# Patient Record
Sex: Female | Born: 1935 | Race: White | Hispanic: No | Smoking: Never smoker
Health system: Southern US, Community
[De-identification: ages and names within clinical notes are randomized; demographics above are authoritative.]

## PROBLEM LIST (undated history)

## (undated) DIAGNOSIS — E119 Type 2 diabetes mellitus without complications: Secondary | ICD-10-CM

## (undated) DIAGNOSIS — I10 Essential (primary) hypertension: Secondary | ICD-10-CM

## (undated) DIAGNOSIS — M8580 Other specified disorders of bone density and structure, unspecified site: Secondary | ICD-10-CM

## (undated) DIAGNOSIS — L0100 Impetigo, unspecified: Secondary | ICD-10-CM

## (undated) DIAGNOSIS — R1110 Vomiting, unspecified: Secondary | ICD-10-CM

## (undated) DIAGNOSIS — I4891 Unspecified atrial fibrillation: Secondary | ICD-10-CM

## (undated) DIAGNOSIS — Z972 Presence of dental prosthetic device (complete) (partial): Secondary | ICD-10-CM

## (undated) DIAGNOSIS — J45909 Unspecified asthma, uncomplicated: Secondary | ICD-10-CM

## (undated) DIAGNOSIS — C801 Malignant (primary) neoplasm, unspecified: Secondary | ICD-10-CM

## (undated) DIAGNOSIS — I509 Heart failure, unspecified: Secondary | ICD-10-CM

## (undated) DIAGNOSIS — R011 Cardiac murmur, unspecified: Secondary | ICD-10-CM

## (undated) DIAGNOSIS — M1990 Unspecified osteoarthritis, unspecified site: Secondary | ICD-10-CM

## (undated) HISTORY — PX: BLADDER SURGERY: SHX569

## (undated) HISTORY — PX: CARPAL TUNNEL RELEASE: SHX101

## (undated) HISTORY — PX: THYROID SURGERY: SHX805

## (undated) HISTORY — PX: JOINT REPLACEMENT: SHX530

## (undated) HISTORY — PX: ABDOMINAL HYSTERECTOMY: SHX81

## (undated) HISTORY — PX: BACK SURGERY: SHX140

## (undated) HISTORY — PX: EYE SURGERY: SHX253

## (undated) HISTORY — PX: REPLACEMENT TOTAL KNEE BILATERAL: SUR1225

## (undated) HISTORY — PX: CERVICAL DISC SURGERY: SHX588

## (undated) HISTORY — PX: CHOLECYSTECTOMY: SHX55

---

## 1989-03-19 DIAGNOSIS — E119 Type 2 diabetes mellitus without complications: Secondary | ICD-10-CM | POA: Insufficient documentation

## 1989-03-19 DIAGNOSIS — I10 Essential (primary) hypertension: Secondary | ICD-10-CM | POA: Insufficient documentation

## 2010-07-23 HISTORY — PX: TOTAL SHOULDER REPLACEMENT: SUR1217

## 2012-07-23 HISTORY — PX: HERNIA REPAIR: SHX51

## 2013-02-18 LAB — CBC WITH DIFFERENTIAL/PLATELET
Basophil #: 0.1 10*3/uL (ref 0.0–0.1)
HCT: 37 % (ref 35.0–47.0)
Lymphocyte %: 7 %
MCHC: 34.5 g/dL (ref 32.0–36.0)
Monocyte #: 0.2 x10 3/mm (ref 0.2–0.9)
Neutrophil %: 89.7 %
Platelet: 230 10*3/uL (ref 150–440)
RBC: 4.13 10*6/uL (ref 3.80–5.20)

## 2013-02-18 LAB — COMPREHENSIVE METABOLIC PANEL
Bilirubin,Total: 0.4 mg/dL (ref 0.2–1.0)
Calcium, Total: 8.9 mg/dL (ref 8.5–10.1)
Co2: 22 mmol/L (ref 21–32)
Creatinine: 1.24 mg/dL (ref 0.60–1.30)
EGFR (African American): 48 — ABNORMAL LOW
EGFR (Non-African Amer.): 42 — ABNORMAL LOW
Osmolality: 283 (ref 275–301)
Sodium: 133 mmol/L — ABNORMAL LOW (ref 136–145)
Total Protein: 7.7 g/dL (ref 6.4–8.2)

## 2013-02-19 ENCOUNTER — Inpatient Hospital Stay: Payer: Self-pay | Admitting: Surgery

## 2013-02-19 LAB — URINALYSIS, COMPLETE
Nitrite: NEGATIVE
Protein: NEGATIVE
RBC,UR: <1 /HPF (ref 0–5)
WBC UR: 8 /HPF (ref 0–5)

## 2013-02-20 LAB — BASIC METABOLIC PANEL
Calcium, Total: 8.3 mg/dL — ABNORMAL LOW (ref 8.5–10.1)
Chloride: 102 mmol/L (ref 98–107)
Creatinine: 1.23 mg/dL (ref 0.60–1.30)
EGFR (African American): 49 — ABNORMAL LOW
EGFR (Non-African Amer.): 42 — ABNORMAL LOW
Glucose: 179 mg/dL — ABNORMAL HIGH (ref 65–99)
Osmolality: 279 (ref 275–301)

## 2013-02-20 LAB — CBC WITH DIFFERENTIAL/PLATELET
Basophil %: 0.4 %
Eosinophil #: 0.1 10*3/uL (ref 0.0–0.7)
Eosinophil %: 1 %
HGB: 11.8 g/dL — ABNORMAL LOW (ref 12.0–16.0)
Lymphocyte #: 1.5 10*3/uL (ref 1.0–3.6)
Lymphocyte %: 11.9 %
MCHC: 35.5 g/dL (ref 32.0–36.0)
Monocyte #: 1.2 x10 3/mm — ABNORMAL HIGH (ref 0.2–0.9)
Monocyte %: 9.6 %
Neutrophil #: 9.6 10*3/uL — ABNORMAL HIGH (ref 1.4–6.5)
WBC: 12.4 10*3/uL — ABNORMAL HIGH (ref 3.6–11.0)

## 2013-02-20 LAB — CLOSTRIDIUM DIFFICILE BY PCR

## 2013-02-23 LAB — CULTURE, BLOOD (SINGLE)

## 2014-01-16 ENCOUNTER — Inpatient Hospital Stay: Payer: Self-pay | Admitting: Internal Medicine

## 2014-01-16 LAB — COMPREHENSIVE METABOLIC PANEL
AST: 17 U/L (ref 15–37)
Albumin: 3.6 g/dL (ref 3.4–5.0)
Alkaline Phosphatase: 136 U/L — ABNORMAL HIGH
Anion Gap: 9 (ref 7–16)
BILIRUBIN TOTAL: 0.6 mg/dL (ref 0.2–1.0)
BUN: 21 mg/dL — AB (ref 7–18)
CALCIUM: 9.2 mg/dL (ref 8.5–10.1)
Chloride: 102 mmol/L (ref 98–107)
Co2: 26 mmol/L (ref 21–32)
Creatinine: 1.01 mg/dL (ref 0.60–1.30)
EGFR (African American): >60
GFR CALC NON AF AMER: 53 — AB
GLUCOSE: 165 mg/dL — AB (ref 65–99)
OSMOLALITY: 280 (ref 275–301)
POTASSIUM: 4 mmol/L (ref 3.5–5.1)
SGPT (ALT): 52 U/L (ref 12–78)
SODIUM: 137 mmol/L (ref 136–145)
TOTAL PROTEIN: 7.3 g/dL (ref 6.4–8.2)

## 2014-01-16 LAB — CBC WITH DIFFERENTIAL/PLATELET
Basophil #: 0.1 10*3/uL (ref 0.0–0.1)
Basophil %: 1.1 %
Eosinophil #: 0.9 10*3/uL — ABNORMAL HIGH (ref 0.0–0.7)
Eosinophil %: 10.3 %
HCT: 42.5 % (ref 35.0–47.0)
HGB: 14.5 g/dL (ref 12.0–16.0)
LYMPHS PCT: 33.4 %
Lymphocyte #: 2.8 10*3/uL (ref 1.0–3.6)
MCH: 30.9 pg (ref 26.0–34.0)
MCHC: 34.1 g/dL (ref 32.0–36.0)
MCV: 91 fL (ref 80–100)
Monocyte #: 0.5 x10 3/mm (ref 0.2–0.9)
Monocyte %: 5.7 %
Neutrophil #: 4.2 10*3/uL (ref 1.4–6.5)
Neutrophil %: 49.5 %
PLATELETS: 279 10*3/uL (ref 150–440)
RBC: 4.69 10*6/uL (ref 3.80–5.20)
RDW: 13.7 % (ref 11.5–14.5)
WBC: 8.4 10*3/uL (ref 3.6–11.0)

## 2014-01-17 LAB — BASIC METABOLIC PANEL
Anion Gap: 11 (ref 7–16)
BUN: 28 mg/dL — ABNORMAL HIGH (ref 7–18)
CHLORIDE: 100 mmol/L (ref 98–107)
CREATININE: 1.3 mg/dL (ref 0.60–1.30)
Calcium, Total: 9.4 mg/dL (ref 8.5–10.1)
Co2: 23 mmol/L (ref 21–32)
EGFR (African American): 45 — ABNORMAL LOW
GFR CALC NON AF AMER: 39 — AB
Glucose: 240 mg/dL — ABNORMAL HIGH (ref 65–99)
OSMOLALITY: 282 (ref 275–301)
Potassium: 3.9 mmol/L (ref 3.5–5.1)
Sodium: 134 mmol/L — ABNORMAL LOW (ref 136–145)

## 2014-01-17 LAB — CBC WITH DIFFERENTIAL/PLATELET
BASOS ABS: 0 10*3/uL (ref 0.0–0.1)
BASOS PCT: 0.1 %
Eosinophil #: 0 10*3/uL (ref 0.0–0.7)
Eosinophil %: 0.1 %
HCT: 37.9 % (ref 35.0–47.0)
HGB: 13.1 g/dL (ref 12.0–16.0)
LYMPHS ABS: 0.8 10*3/uL — AB (ref 1.0–3.6)
Lymphocyte %: 9.1 %
MCH: 31 pg (ref 26.0–34.0)
MCHC: 34.5 g/dL (ref 32.0–36.0)
MCV: 90 fL (ref 80–100)
Monocyte #: 0.1 x10 3/mm — ABNORMAL LOW (ref 0.2–0.9)
Monocyte %: 0.8 %
Neutrophil #: 7.5 10*3/uL — ABNORMAL HIGH (ref 1.4–6.5)
Neutrophil %: 89.9 %
PLATELETS: 234 10*3/uL (ref 150–440)
RBC: 4.22 10*6/uL (ref 3.80–5.20)
RDW: 13.7 % (ref 11.5–14.5)
WBC: 8.3 10*3/uL (ref 3.6–11.0)

## 2014-01-18 LAB — BASIC METABOLIC PANEL
ANION GAP: 9 (ref 7–16)
BUN: 40 mg/dL — AB (ref 7–18)
CHLORIDE: 104 mmol/L (ref 98–107)
CREATININE: 1.56 mg/dL — AB (ref 0.60–1.30)
Calcium, Total: 9.1 mg/dL (ref 8.5–10.1)
Co2: 22 mmol/L (ref 21–32)
EGFR (Non-African Amer.): 31 — ABNORMAL LOW
GFR CALC AF AMER: 36 — AB
Glucose: 186 mg/dL — ABNORMAL HIGH (ref 65–99)
OSMOLALITY: 285 (ref 275–301)
Potassium: 3.9 mmol/L (ref 3.5–5.1)
Sodium: 135 mmol/L — ABNORMAL LOW (ref 136–145)

## 2014-01-19 LAB — BASIC METABOLIC PANEL
ANION GAP: 9 (ref 7–16)
BUN: 38 mg/dL — ABNORMAL HIGH (ref 7–18)
CALCIUM: 9.2 mg/dL (ref 8.5–10.1)
CHLORIDE: 103 mmol/L (ref 98–107)
Co2: 22 mmol/L (ref 21–32)
Creatinine: 1.28 mg/dL (ref 0.60–1.30)
EGFR (Non-African Amer.): 40 — ABNORMAL LOW
GFR CALC AF AMER: 46 — AB
Glucose: 259 mg/dL — ABNORMAL HIGH (ref 65–99)
OSMOLALITY: 286 (ref 275–301)
Potassium: 3.8 mmol/L (ref 3.5–5.1)
SODIUM: 134 mmol/L — AB (ref 136–145)

## 2014-01-20 LAB — BASIC METABOLIC PANEL
ANION GAP: 12 (ref 7–16)
BUN: 30 mg/dL — ABNORMAL HIGH (ref 7–18)
CO2: 22 mmol/L (ref 21–32)
Calcium, Total: 8.4 mg/dL — ABNORMAL LOW (ref 8.5–10.1)
Chloride: 104 mmol/L (ref 98–107)
Creatinine: 1.07 mg/dL (ref 0.60–1.30)
GFR CALC AF AMER: 57 — AB
GFR CALC NON AF AMER: 49 — AB
Glucose: 196 mg/dL — ABNORMAL HIGH (ref 65–99)
Osmolality: 287 (ref 275–301)
POTASSIUM: 3.4 mmol/L — AB (ref 3.5–5.1)
SODIUM: 138 mmol/L (ref 136–145)

## 2014-01-20 LAB — PLATELET COUNT: PLATELETS: 231 10*3/uL (ref 150–440)

## 2014-01-20 LAB — HEMOGLOBIN A1C: HEMOGLOBIN A1C: 7.5 % — AB (ref 4.2–6.3)

## 2014-06-21 ENCOUNTER — Ambulatory Visit: Payer: Self-pay | Admitting: Ophthalmology

## 2014-07-13 ENCOUNTER — Emergency Department: Payer: Self-pay | Admitting: Emergency Medicine

## 2014-07-13 LAB — BASIC METABOLIC PANEL
Anion Gap: 12 (ref 7–16)
BUN: 18 mg/dL (ref 7–18)
Calcium, Total: 8.5 mg/dL (ref 8.5–10.1)
Chloride: 106 mmol/L (ref 98–107)
Co2: 23 mmol/L (ref 21–32)
Creatinine: 1.26 mg/dL (ref 0.60–1.30)
GFR CALC AF AMER: 53 — AB
GFR CALC NON AF AMER: 44 — AB
GLUCOSE: 166 mg/dL — AB (ref 65–99)
OSMOLALITY: 287 (ref 275–301)
Potassium: 3.3 mmol/L — ABNORMAL LOW (ref 3.5–5.1)
Sodium: 141 mmol/L (ref 136–145)

## 2014-07-13 LAB — CBC
HCT: 45.3 % (ref 35.0–47.0)
HGB: 15.2 g/dL (ref 12.0–16.0)
MCH: 30.1 pg (ref 26.0–34.0)
MCHC: 33.5 g/dL (ref 32.0–36.0)
MCV: 90 fL (ref 80–100)
PLATELETS: 274 10*3/uL (ref 150–440)
RBC: 5.05 10*6/uL (ref 3.80–5.20)
RDW: 12.9 % (ref 11.5–14.5)
WBC: 8.6 10*3/uL (ref 3.6–11.0)

## 2014-07-13 LAB — TROPONIN I: Troponin-I: <0.02 ng/mL

## 2014-11-12 NOTE — H&P (Signed)
PATIENT NAME:  Leslie Parker, Leslie Parker MR#:  [id number] DATE OF BIRTH:  1933-01-25  DATE OF ADMISSION:  02/19/2013  CHIEF COMPLAINT: Left lower quadrant pain and fever.   HISTORY OF PRESENT ILLNESS: This is a 81 year old female patient, somewhat difficult historian, who presents with multiple episodes over the last year of left lower quadrant pain. She has had some nausea. She does not vomit because she apparently has had a Nissen fundoplication in the past but has been doing some heaving all night and had chills and fevers at home, so she came to the Emergency Room. Workup has suggested acute diverticulitis. Of note, she was on antibiotics a month ago at home with oral antibiotics for the same condition which resolved.   She denies melena or hematochezia. Had a colonoscopy 5 years ago, possibly showing polyps. All of her care is in [facility], including her surgical intervention for Nissen fundoplication, reflux disease and hiatal hernia.   PAST MEDICAL HISTORY: Diabetes. Meds are not showing on her med reconciliation, but she apparently takes metformin at this time. She has hypertension and possibly some pulmonary disease and depression.   PAST SURGICAL HISTORY: Cholecystectomy, hysterectomy. Ovaries were removed, but appendix is still in place. She has had 3 neck surgeries, 1 shoulder surgery and both knees replaced.   ALLERGIES: None.   MEDICATIONS: Multiple, see reconciliation chart.   FAMILY HISTORY: Noncontributory.   SOCIAL HISTORY: The patient does not smoke or drink.   REVIEW OF SYSTEMS: A 10-system review was performed and negative with the exception of that mentioned in the HPI.    PHYSICAL EXAMINATION:  GENERAL: Obese female patient with a weight of 182 pounds.  VITAL SIGNS: Temperature as 103.2 on admission. It is now 98.7. Pulse of 96, respirations 18, blood pressure 118/60, room air sat 96%.  HEENT: No scleral icterus.  NECK: No palpable neck nodes.  CHEST: Bilateral crackles and  rhonchi.  CARDIAC: Regular rate and rhythm.  ABDOMEN: Showing some guarding in the left lower quadrant, some percussion tenderness. Scars are noted. She is most tender in the left lower quadrant. No tenderness in the right lower quadrant.  EXTREMITIES: Show minimal edema.  NEUROLOGIC: Grossly intact.  INTEGUMENT: Shows no jaundice.   RADIOLOGY: CT scan is personally reviewed. No IV contrast was utilized, so stranding cannot be commented upon; however, she has extensive diverticular disease and redundant colon in the pelvis on the left side.   LABORATORY VALUES: Demonstrate a creatinine of 1.24, a sodium of 133 and a potassium of 3.2, glucose of 284. White blood cell count of 11.1, H and H of 12.8 and 37 and a platelet count of 230.   ASSESSMENT AND PLAN: This is a patient with likely diverticulitis. She will be admitted to the hospital and given IV antibiotics and re-examined. The options of p.o. antibiotics were discussed with her. She understood and agreed with this plan.    ____________________________ Jerrol Banana. Burt Knack, MD rec:gb D: 02/19/2013 03:22:30 ET T: 02/19/2013 03:57:21 ET JOB#: [id number]  cc: Jerrol Banana. Burt Knack, MD, <Dictator> Florene Glen MD ELECTRONICALLY SIGNED 02/19/2013 19:13

## 2014-11-12 NOTE — H&P (Signed)
Subjective/Chief Complaint LLQ pain and fever   History of Present Illness mult prior episodes, recently on po abx LLQ pain, fevers, nausea, heaving nml BM   Past History DM,HTN, depression PSH knees, shoulder neck GB Hyst, Nissen   Past Medical Health Hypertension, Diabetes Mellitus   Past Med/Surgical Hx:  Hypercholesterolemia:   Depression:   HTN:   Diverticulitis:   Hernia Repair:   Hysterectomy - Total:   Shoulder Surgery - Right:   Knee Surgery - Right:   Knee Surgery - Left:   Cholecystectomy:   Bladder Surgery:   Neck Surgery:   ALLERGIES:  No Known Allergies:   HOME MEDICATIONS: Medication Instructions Status  Advair Diskus 500 mcg-50 mcg inhalation powder 1 puff(s) inhaled 2 times a day Active  hydrochlorothiazide 12.5 mg oral tablet 1 tab(s) orally once a day Active  amLODIPine 10 mg oral tablet 1 tab(s) orally once a day Active  Zoloft 25 mg oral tablet 1 tab(s) orally once a day Active  simvastatin 10 mg oral tablet 1 tab(s) orally once a day (at bedtime) Active   Family and Social History:  Family History Non-Contributory   Social History negative tobacco, negative ETOH   Place of Living Home   Review of Systems:  Fever/Chills Yes   Cough No   Abdominal Pain Yes   Diarrhea No   Constipation No   Nausea/Vomiting Yes   SOB/DOE No   Chest Pain No   Dysuria No   Tolerating Diet No  Nauseated   Medications/Allergies Reviewed Medications/Allergies reviewed   Physical Exam:  GEN no acute distress, obese   HEENT pink conjunctivae   NECK supple   RESP normal resp effort  no use of accessory muscles  wheezing  rhonchi   CARD regular rate   ABD positive tenderness  no hernia  scars, LLQ tenderness with guarding, perc tenderness   LYMPH negative neck   EXTR positive edema   SKIN normal to palpation   PSYCH alert, A+O to time, place, person, good insight   Additional Comments had colonoscopy five years ago   Lab  Results: Hepatic:  30-Jul-14 22:39   Bilirubin, Total 0.4  Alkaline Phosphatase 103  SGPT (ALT) 23  SGOT (AST) 26  Total Protein, Serum 7.7  Albumin, Serum 3.4  Routine Chem:  30-Jul-14 22:39   Glucose, Serum  284  BUN  29  Creatinine (comp) 1.24  Sodium, Serum  133  Potassium, Serum  3.2  Chloride, Serum 99  CO2, Serum 22  Calcium (Total), Serum 8.9  Osmolality (calc) 283  eGFR (African American)  48  eGFR (Non-African American)  42 (eGFR values <90m/min/1.73 m2 may be an indication of chronic kidney disease (CKD). Calculated eGFR is useful in patients with stable renal function. The eGFR calculation will not be reliable in acutely ill patients when serum creatinine is changing rapidly. It is not useful in  patients on dialysis. The eGFR calculation may not be applicable to patients at the low and high extremes of body sizes, pregnant women, and vegetarians.)  Anion Gap 12  Routine UA:  31-Jul-14 02:54   Color (UA) Straw  Clarity (UA) Clear  Glucose (UA) Negative  Bilirubin (UA) Negative  Ketones (UA) Negative  Specific Gravity (UA) 1.006  Blood (UA) Negative  pH (UA) 6.0  Protein (UA) Negative  Nitrite (UA) Negative  Leukocyte Esterase (UA) 1+ (Result(s) reported on 19 Feb 2013 at 03:11AM.)  RBC (UA) <1 /HPF  WBC (UA) 8 /HPF  Bacteria (UA)  TRACE  Epithelial Cells (UA) 1 /HPF (Result(s) reported on 19 Feb 2013 at 03:11AM.)  Routine Hem:  30-Jul-14 22:39   WBC (CBC)  11.1  RBC (CBC) 4.13  Hemoglobin (CBC) 12.8  Hematocrit (CBC) 37.0  Platelet Count (CBC) 230  MCV 90  MCH 30.9  MCHC 34.5  RDW 12.8  Neutrophil % 89.7  Lymphocyte % 7.0  Monocyte % 1.8  Eosinophil % 1.0  Basophil % 0.5  Neutrophil #  9.9  Lymphocyte #  0.8  Monocyte # 0.2  Eosinophil # 0.1  Basophil # 0.1 (Result(s) reported on 18 Feb 2013 at 11:33PM.)    Assessment/Admission Diagnosis ac diverticulitis CT personally rev'd, no IV contrast but lots of diverticuli and tenderness,  fever admit, IV abx   Electronic Signatures: Florene Glen (MD)  (Signed 31-Jul-14 03:17)  Authored: CHIEF COMPLAINT and HISTORY, PAST MEDICAL/SURGIAL HISTORY, ALLERGIES, HOME MEDICATIONS, FAMILY AND SOCIAL HISTORY, REVIEW OF SYSTEMS, PHYSICAL EXAM, LABS, ASSESSMENT AND PLAN   Last Updated: 31-Jul-14 03:17 by Florene Glen (MD)

## 2014-11-12 NOTE — Discharge Summary (Signed)
PATIENT NAME:  Leslie Parker, BRAZZLE MR#:  [id number] DATE OF BIRTH:  Dec 01, 1932  DATE OF ADMISSION:  02/19/2013 DATE OF DISCHARGE:  02/21/2013  DIAGNOSES: Acute diverticulitis,  diabetes, hypertension, chronic obstructive pulmonary disease and depression.   PROCEDURES: None.   HISTORY OF PRESENT ILLNESS AND HOSPITAL COURSE:  This is a 81 year old female patient who presented with chronic left lower quadrant abdominal pain. Work-up in the past has shown probable diverticulitis and this was confirmed on noncontrast CT scan suggesting diverticulosis and diverticulitis. She was placed in the hospital, treated with IV antibiotics and her pain continued to improve, as did her white blood cell count. She remained afebrile. She was discharged in stable condition on oral antibiotics to follow up in our office in 10 days.   ____________________________ Jerrol Banana Burt Knack, MD rec:nts D: 02/28/2013 19:27:01 ET T: 03/01/2013 00:04:56 ET JOB#: [id number]  cc: Jerrol Banana. Burt Knack, MD, <Dictator> Florene Glen MD ELECTRONICALLY SIGNED 03/01/2013 19:47

## 2014-11-13 NOTE — H&P (Signed)
PATIENT NAME:  Leslie Parker, Leslie Parker MR#:  [id number] DATE OF BIRTH:  05/28/1933  DATE OF ADMISSION:  01/16/2014  PRIMARY CARE PHYSICIAN: Loney Loh, MD, at [facility]  REFERRING EMERGENCY ROOM PHYSICIAN: Doren Custard A. Joni Fears, MD  CHIEF COMPLAINT: Coughing and shortness of breath.   HISTORY OF PRESENT ILLNESS: A 81 year old female who has a history of COPD, diabetes, hypertension, who lives alone and very independent in her day-to-day life. For the last 2 to 3 weeks, she started having episodes of shortness of breath and coughing, which is having some good days and bad days in between. She has albuterol and Advair inhalers at home, which she uses only if she needs. Otherwise, when she is in good health, she does not need and does not take any inhalers. For the last 2 weeks, she was taking it almost every day but did not get enough relief and last night was worse. She was getting severe shortness of breath even while walking 5 to 10 steps to the bathroom, so decided to come to the Emergency Room today.   She called her primary care, actually, earlier this week and she got an appointment for next Monday, but after last night's episode could not wait until then and came over here. Given multiple times nebulizer treatment in the ER, but did not feel better and so decided to get admitted to the hospitalist service.   REVIEW OF SYSTEMS:  CONSTITUTIONAL: Negative for fever, fatigue, weakness, pain, and weight loss.  EYES: No blurring, double vision, discharge or redness.  ENT: No tinnitus, ear pain, or hearing loss.  RESPIRATORY: The patient has some cough and wheezing and shortness of breath, but no sputum production.  CARDIOVASCULAR: No chest pain, orthopnea, edema, arrhythmia, or palpitations.  GI: No nausea, vomiting, diarrhea, abdominal pain.  GENITOURINARY: No dysuria, hematuria, or increased frequency.  ENDOCRINE: No heat or cold intolerance. No excessive sweating.  SKIN: No acne, rashes, or  lesions.  MUSCULOSKELETAL: No pain or swelling in the joints.  NEUROLOGICAL: No numbness, weakness, tremor, or vertigo.  PSYCHIATRIC: No anxiety, insomnia, bipolar disorder.   PAST MEDICAL HISTORY: Diabetes, hypertension, and COPD.   PAST SURGICAL HISTORY: Had cholecystectomy, hysterectomy, ovarian surgery, appendix surgery, 3 times neck surgery, 1 time shoulder surgery, both knees replaced, hernia surgery on abdominal wall.   FAMILY HISTORY: Father had emphysema, but he was a smoker and worked as a Curator his whole life. Mother had hypertension.   SOCIAL HISTORY: She lives alone. She worked in the past in [employer] and then in  in [place], both in food services. She does not smoke. No drinking and no illegal drug use.   HOME MEDICATIONS: As per the pharmacist confirmed in the hospital:  1. Metformin 500 mg oral 2 times a day.  2. Magnesium oxide 400 mg once a day.  3. Lisinopril 10 mg oral tablet once a day.  4. Amlodipine 10 mg oral once a day.   PHYSICAL EXAMINATION:  VITAL SIGNS: In ER, temperature 98.6, pulse is 99, respirations 24, blood pressure is 154/99 and pulse oximetry is 95% on room air.  GENERAL: The patient is fully alert and oriented to time, place, and person. Does not appear in any acute distress.  HEENT: Head and neck atraumatic. Conjunctivae pink. Oral mucosa moist.  NECK: Supple. No JVD.  RESPIRATORY: Bilateral equal air entry. Some wheezing present.  CARDIOVASCULAR: S2 present, regular. No murmur.  ABDOMEN: Soft, nontender. Bowel sounds present. No organomegaly.  SKIN: No rashes.  LEGS: No edema.  NEUROLOGICAL: Power 5/5, follows commands, moves all 4 limbs.  SKIN: No acne, rashes, or lesions.  NEUROLOGICAL: No rigidity or tremors.  PSYCHIATRIC: Does not appear in any acute psychiatric illness at this time.   IMPORTANT LABORATORY RESULTS: Glucose 165, BUN 21, creatinine 1.01, sodium is 137, potassium 4, chloride is 102, CO2 is 26, and calcium is 9.2.  Total protein 7.3, bilirubin is 0.6, alkaline phosphate 136, SGOT is 17 and SGPT 52. WBC is 8.4, hemoglobin is 14.5, and platelet is 279,000. MCV is 91.   Chest x-ray was done in the ER, which is no acute inflammatory pulmonary edema. Central mild bronchitic changes present.   ASSESSMENT AND PLAN: A 81 year old female with a past medical history of chronic obstructive pulmonary disease, hypertension, diabetes, came to the Emergency Room with severe worsening shortness of breath and cough and given multiple nebulizer treatments, but still continued to have respiratory distress and admitted for chronic obstructive pulmonary disease exacerbation.  1.  Chronic obstructive pulmonary disease exacerbation. We will continue on IV steroid nebulizer treatment. We will give Rocephin and azithromycin and will give Spiriva, oxygen as needed.  2.  Hypertension. Blood pressure is currently under control. We will continue with home medication, amlodipine and lisinopril.  3.  Diabetes. We will continue her metformin, as she is taking at home and we will put insulin sliding scale coverage as she will be on IV steroids.  4.  Deep vein thrombosis prophylaxis with heparin.  5.  Gastrointestinal prophylaxis with pantoprazole.   CODE STATUS: Full code.   TOTAL TIME SPENT ON THIS ADMISSION: 50 minutes.    ____________________________ Ceasar Lund Anselm Jungling, MD vgv:ah D: 01/16/2014 17:14:03 ET T: 01/16/2014 19:35:03 ET JOB#: [id number]  cc: Ceasar Lund. Anselm Jungling, MD, <Dictator> Vaughan Basta MD ELECTRONICALLY SIGNED 01/18/2014 11:10

## 2014-11-13 NOTE — Discharge Summary (Signed)
PATIENT NAME:  Leslie Parker, Leslie Parker MR#:  [id number] DATE OF BIRTH:  1933-01-27  DATE OF ADMISSION:  01/16/2014 DATE OF DISCHARGE:  01/21/2014  FINAL DIAGNOSES:  1. Asthmatic bronchitis.  2. Hypertension.  3. Diabetes.  4. Acute kidney injury resolved with IV fluids.   MEDICATIONS ON DISCHARGE: Include amlodipine 10 mg daily, metformin 500 mg twice a day, magnesium oxide 400 mg daily, lisinopril 10 mg daily, albuterol CFC 2 puffs 4 times a day as needed for shortness of breath, Advair Diskus 250/5 one puff twice a day.  Prednisone 5 mg 4 tablets day one, 3 tablets day two, 2 tablets days three, 1 tablet days four and five, then stop. Acetaminophen oxycodone 325/5 one tablet every 6 hours as needed for pain, nystatin 100,000 units/mL 5 mL every 6 hours for 7 days, cefuroxime 500 mg 1 tablet every 12 hours for 5 days.   DIET:  Low sodium, carbohydrate -controlled diet, regular consistency.   ACTIVITY: As tolerated. Follow up in 1-2 weeks with your medical doctor.   HOSPITAL COURSE: The patient was admitted 01/16/2014, discharged 01/21/2014. Came in with coughing and shortness of breath, admitted with COPD exacerbation and was given Rocephin and Zithromax.   LABORATORY AND RADIOLOGICAL DATA:  EKG showed sinus rhythm, fusion complexes, left axis deviation, low voltage QRS, glucose 165, BUN 21, creatinine 1.01, sodium 137, potassium 4.0, chloride 102, CO2 of 26, calcium 9.2. Liver function tests: Alkaline phosphatase slightly elevated at 136. White blood cell count 8.4, H and H 14.5 and 42.5, platelet count of 279,000.   Chest x-ray: No acute infiltrate or pulmonary edema. Chest x-ray showed COPD, chronic bronchitis. Creatinine went up to 1.56, upon discharge 1.07.   HOSPITAL COURSE PER PROBLEM LIST:  1. Final diagnosis, asthmatic bronchitis. The patient took a long time to break. Was on high-dose IV Solu-Medrol, decreased to medium dose, then on high dose, and then down to medium dose.  Finally, broke upon  discharge. Lungs: Slight expiratory wheeze on the bases. Upper airways clear. Finished Zithromax while here. Cefuroxime will be completed for 5 more days. The patient is on a prednisone taper.  2. Hypertension. Blood pressure slightly elevated on lisinopril and amlodipine.  3. Diabetes. On metformin. Sugars were high while on the steroids. Hemoglobin A1c only 7.5. Does not need other medications upon discharge. Sugars will be high for a couple days.  4. Acute kidney injury resolved with IV fluid hydration.  TIME SPENT ON DISCHARGE: 35 minutes.    ____________________________ Tana Conch. Leslye Peer, MD rjw:dd D: 01/21/2014 15:30:49 ET T: 01/21/2014 21:24:52 ET JOB#: [id number]  cc: Tana Conch. Leslye Peer, MD, <Dictator> Marisue Brooklyn MD ELECTRONICALLY SIGNED 01/24/2014 14:36

## 2015-02-03 ENCOUNTER — Encounter: Payer: Self-pay | Admitting: Emergency Medicine

## 2015-02-03 ENCOUNTER — Emergency Department: Payer: Medicare Other

## 2015-02-03 ENCOUNTER — Inpatient Hospital Stay
Admission: EM | Admit: 2015-02-03 | Discharge: 2015-02-10 | DRG: 480 | Disposition: A | Discharge status: Other Acute Inpatient Hospital | Payer: Medicare Other | Attending: Internal Medicine | Admitting: Internal Medicine

## 2015-02-03 DIAGNOSIS — J45901 Unspecified asthma with (acute) exacerbation: Secondary | ICD-10-CM | POA: Diagnosis not present

## 2015-02-03 DIAGNOSIS — Z79899 Other long term (current) drug therapy: Secondary | ICD-10-CM

## 2015-02-03 DIAGNOSIS — R0602 Shortness of breath: Secondary | ICD-10-CM | POA: Insufficient documentation

## 2015-02-03 DIAGNOSIS — E1165 Type 2 diabetes mellitus with hyperglycemia: Secondary | ICD-10-CM | POA: Diagnosis not present

## 2015-02-03 DIAGNOSIS — Z8249 Family history of ischemic heart disease and other diseases of the circulatory system: Secondary | ICD-10-CM

## 2015-02-03 DIAGNOSIS — S72142A Displaced intertrochanteric fracture of left femur, initial encounter for closed fracture: Principal | ICD-10-CM | POA: Diagnosis present

## 2015-02-03 DIAGNOSIS — X58XXXA Exposure to other specified factors, initial encounter: Secondary | ICD-10-CM | POA: Diagnosis not present

## 2015-02-03 DIAGNOSIS — Z96653 Presence of artificial knee joint, bilateral: Secondary | ICD-10-CM | POA: Diagnosis present

## 2015-02-03 DIAGNOSIS — M85852 Other specified disorders of bone density and structure, left thigh: Secondary | ICD-10-CM | POA: Diagnosis present

## 2015-02-03 DIAGNOSIS — R7981 Abnormal blood-gas level: Secondary | ICD-10-CM

## 2015-02-03 DIAGNOSIS — Z888 Allergy status to other drugs, medicaments and biological substances status: Secondary | ICD-10-CM | POA: Diagnosis not present

## 2015-02-03 DIAGNOSIS — Z7952 Long term (current) use of systemic steroids: Secondary | ICD-10-CM

## 2015-02-03 DIAGNOSIS — R0902 Hypoxemia: Secondary | ICD-10-CM | POA: Insufficient documentation

## 2015-02-03 DIAGNOSIS — M1990 Unspecified osteoarthritis, unspecified site: Secondary | ICD-10-CM | POA: Diagnosis present

## 2015-02-03 DIAGNOSIS — S72002A Fracture of unspecified part of neck of left femur, initial encounter for closed fracture: Secondary | ICD-10-CM | POA: Diagnosis present

## 2015-02-03 DIAGNOSIS — J441 Chronic obstructive pulmonary disease with (acute) exacerbation: Secondary | ICD-10-CM | POA: Diagnosis not present

## 2015-02-03 DIAGNOSIS — Z419 Encounter for procedure for purposes other than remedying health state, unspecified: Secondary | ICD-10-CM

## 2015-02-03 DIAGNOSIS — J9601 Acute respiratory failure with hypoxia: Secondary | ICD-10-CM | POA: Diagnosis not present

## 2015-02-03 DIAGNOSIS — S7292XA Unspecified fracture of left femur, initial encounter for closed fracture: Secondary | ICD-10-CM

## 2015-02-03 DIAGNOSIS — J45909 Unspecified asthma, uncomplicated: Secondary | ICD-10-CM | POA: Diagnosis present

## 2015-02-03 DIAGNOSIS — J9811 Atelectasis: Secondary | ICD-10-CM | POA: Diagnosis not present

## 2015-02-03 DIAGNOSIS — I10 Essential (primary) hypertension: Secondary | ICD-10-CM | POA: Diagnosis present

## 2015-02-03 DIAGNOSIS — Y834 Other reconstructive surgery as the cause of abnormal reaction of the patient, or of later complication, without mention of misadventure at the time of the procedure: Secondary | ICD-10-CM | POA: Diagnosis not present

## 2015-02-03 DIAGNOSIS — Y92009 Unspecified place in unspecified non-institutional (private) residence as the place of occurrence of the external cause: Secondary | ICD-10-CM | POA: Diagnosis not present

## 2015-02-03 DIAGNOSIS — J9620 Acute and chronic respiratory failure, unspecified whether with hypoxia or hypercapnia: Secondary | ICD-10-CM | POA: Diagnosis present

## 2015-02-03 DIAGNOSIS — S72009A Fracture of unspecified part of neck of unspecified femur, initial encounter for closed fracture: Secondary | ICD-10-CM | POA: Diagnosis present

## 2015-02-03 DIAGNOSIS — Y93E9 Activity, other interior property and clothing maintenance: Secondary | ICD-10-CM

## 2015-02-03 DIAGNOSIS — W010XXA Fall on same level from slipping, tripping and stumbling without subsequent striking against object, initial encounter: Secondary | ICD-10-CM | POA: Diagnosis present

## 2015-02-03 DIAGNOSIS — Z7951 Long term (current) use of inhaled steroids: Secondary | ICD-10-CM

## 2015-02-03 DIAGNOSIS — D62 Acute posthemorrhagic anemia: Secondary | ICD-10-CM | POA: Diagnosis not present

## 2015-02-03 DIAGNOSIS — J9621 Acute and chronic respiratory failure with hypoxia: Secondary | ICD-10-CM | POA: Diagnosis not present

## 2015-02-03 DIAGNOSIS — J9589 Other postprocedural complications and disorders of respiratory system, not elsewhere classified: Secondary | ICD-10-CM | POA: Diagnosis not present

## 2015-02-03 DIAGNOSIS — J4550 Severe persistent asthma, uncomplicated: Secondary | ICD-10-CM | POA: Diagnosis not present

## 2015-02-03 DIAGNOSIS — T380X5A Adverse effect of glucocorticoids and synthetic analogues, initial encounter: Secondary | ICD-10-CM | POA: Diagnosis present

## 2015-02-03 HISTORY — DX: Unspecified osteoarthritis, unspecified site: M19.90

## 2015-02-03 HISTORY — DX: Type 2 diabetes mellitus without complications: E11.9

## 2015-02-03 HISTORY — DX: Impetigo, unspecified: L01.00

## 2015-02-03 HISTORY — DX: Other specified disorders of bone density and structure, unspecified site: M85.80

## 2015-02-03 HISTORY — DX: Unspecified fracture of left femur, initial encounter for closed fracture: S72.92XA

## 2015-02-03 HISTORY — DX: Essential (primary) hypertension: I10

## 2015-02-03 HISTORY — DX: Unspecified asthma, uncomplicated: J45.909

## 2015-02-03 LAB — URINALYSIS COMPLETE WITH MICROSCOPIC (ARMC ONLY)
BILIRUBIN URINE: NEGATIVE
Bacteria, UA: NONE SEEN
Glucose, UA: NEGATIVE mg/dL
Hgb urine dipstick: NEGATIVE
Ketones, ur: NEGATIVE mg/dL
Leukocytes, UA: NEGATIVE
NITRITE: NEGATIVE
PH: 7 (ref 5.0–8.0)
PROTEIN: 30 mg/dL — AB
SQUAMOUS EPITHELIAL / LPF: NONE SEEN
Specific Gravity, Urine: 1.014 (ref 1.005–1.030)

## 2015-02-03 LAB — CBC WITH DIFFERENTIAL/PLATELET
BASOS ABS: 0 10*3/uL (ref 0–0.1)
Basophils Relative: 1 %
Eosinophils Absolute: 0.3 10*3/uL (ref 0–0.7)
Eosinophils Relative: 4 %
HCT: 37.1 % (ref 35.0–47.0)
Hemoglobin: 12.6 g/dL (ref 12.0–16.0)
Lymphocytes Relative: 16 %
Lymphs Abs: 1.2 10*3/uL (ref 1.0–3.6)
MCH: 29.9 pg (ref 26.0–34.0)
MCHC: 34 g/dL (ref 32.0–36.0)
MCV: 87.8 fL (ref 80.0–100.0)
MONO ABS: 0.4 10*3/uL (ref 0.2–0.9)
Monocytes Relative: 5 %
NEUTROS ABS: 5.6 10*3/uL (ref 1.4–6.5)
Neutrophils Relative %: 74 %
PLATELETS: 187 10*3/uL (ref 150–440)
RBC: 4.22 MIL/uL (ref 3.80–5.20)
RDW: 13.9 % (ref 11.5–14.5)
WBC: 7.6 10*3/uL (ref 3.6–11.0)

## 2015-02-03 LAB — TYPE AND SCREEN
ABO/RH(D): A POS
Antibody Screen: NEGATIVE

## 2015-02-03 LAB — GLUCOSE, CAPILLARY
GLUCOSE-CAPILLARY: 184 mg/dL — AB (ref 65–99)
Glucose-Capillary: 178 mg/dL — ABNORMAL HIGH (ref 65–99)
Glucose-Capillary: 181 mg/dL — ABNORMAL HIGH (ref 65–99)

## 2015-02-03 LAB — BASIC METABOLIC PANEL
Anion gap: 9 (ref 5–15)
BUN: 23 mg/dL — ABNORMAL HIGH (ref 6–20)
CO2: 23 mmol/L (ref 22–32)
Calcium: 8.7 mg/dL — ABNORMAL LOW (ref 8.9–10.3)
Chloride: 103 mmol/L (ref 101–111)
Creatinine, Ser: 0.9 mg/dL (ref 0.44–1.00)
GFR calc Af Amer: >60 mL/min (ref >60)
GFR, EST NON AFRICAN AMERICAN: 59 mL/min — AB (ref >60)
GLUCOSE: 183 mg/dL — AB (ref 65–99)
Potassium: 3.9 mmol/L (ref 3.5–5.1)
Sodium: 135 mmol/L (ref 135–145)

## 2015-02-03 LAB — ABO/RH: ABO/RH(D): A POS

## 2015-02-03 LAB — ANTIBODY SCREEN: Antibody Screen: NEGATIVE

## 2015-02-03 LAB — PROTIME-INR
INR: 0.98
PROTHROMBIN TIME: 13.2 s (ref 11.4–15.0)

## 2015-02-03 MED ORDER — DOCUSATE SODIUM 100 MG PO CAPS
100.0000 mg | ORAL_CAPSULE | Freq: Two times a day (BID) | ORAL | Status: DC
  Administered 2015-02-03 – 2015-02-10 (×13): 100 mg via ORAL
  Filled 2015-02-03 (×13): qty 1

## 2015-02-03 MED ORDER — ONDANSETRON HCL 4 MG/2ML IJ SOLN
4.0000 mg | Freq: Four times a day (QID) | INTRAMUSCULAR | Status: DC | PRN
  Administered 2015-02-03 – 2015-02-04 (×3): 4 mg via INTRAVENOUS
  Filled 2015-02-03 (×2): qty 2

## 2015-02-03 MED ORDER — ACETAMINOPHEN 325 MG PO TABS: 650.0000 mg | ORAL_TABLET | Freq: Four times a day (QID) | ORAL | Status: DC | PRN

## 2015-02-03 MED ORDER — MORPHINE SULFATE 4 MG/ML IJ SOLN
INTRAMUSCULAR | Status: AC
  Filled 2015-02-03: qty 1

## 2015-02-03 MED ORDER — ALBUTEROL SULFATE (2.5 MG/3ML) 0.083% IN NEBU: 2.5000 mg | INHALATION_SOLUTION | RESPIRATORY_TRACT | Status: DC | PRN

## 2015-02-03 MED ORDER — MORPHINE SULFATE 4 MG/ML IJ SOLN
4.0000 mg | INTRAMUSCULAR | Status: DC | PRN
  Administered 2015-02-03 – 2015-02-04 (×3): 4 mg via INTRAVENOUS
  Filled 2015-02-03 (×2): qty 1

## 2015-02-03 MED ORDER — OXYCODONE HCL 5 MG PO TABS
5.0000 mg | ORAL_TABLET | ORAL | Status: DC | PRN
  Administered 2015-02-04: 5 mg via ORAL
  Filled 2015-02-03: qty 1

## 2015-02-03 MED ORDER — MOMETASONE FURO-FORMOTEROL FUM 200-5 MCG/ACT IN AERO
2.0000 | INHALATION_SPRAY | Freq: Two times a day (BID) | RESPIRATORY_TRACT | Status: DC
  Administered 2015-02-03 – 2015-02-10 (×14): 2 via RESPIRATORY_TRACT
  Filled 2015-02-03: qty 8.8

## 2015-02-03 MED ORDER — SODIUM CHLORIDE 0.9 % IJ SOLN: 3.0000 mL | INTRAMUSCULAR | Status: DC | PRN

## 2015-02-03 MED ORDER — AMLODIPINE BESYLATE 10 MG PO TABS
10.0000 mg | ORAL_TABLET | Freq: Every day | ORAL | Status: DC
  Administered 2015-02-03 – 2015-02-10 (×8): 10 mg via ORAL
  Filled 2015-02-03 (×8): qty 1

## 2015-02-03 MED ORDER — HYDRALAZINE HCL 20 MG/ML IJ SOLN: 10.0000 mg | Freq: Four times a day (QID) | INTRAMUSCULAR | Status: DC | PRN

## 2015-02-03 MED ORDER — MORPHINE SULFATE 4 MG/ML IJ SOLN
4.0000 mg | Freq: Once | INTRAMUSCULAR | Status: AC
  Administered 2015-02-03: 4 mg via INTRAVENOUS

## 2015-02-03 MED ORDER — MORPHINE SULFATE 2 MG/ML IJ SOLN
2.0000 mg | INTRAMUSCULAR | Status: DC | PRN
  Administered 2015-02-03: 2 mg via INTRAVENOUS
  Filled 2015-02-03: qty 1

## 2015-02-03 MED ORDER — ENOXAPARIN SODIUM 40 MG/0.4ML ~~LOC~~ SOLN
40.0000 mg | SUBCUTANEOUS | Status: DC
  Filled 2015-02-03: qty 0.4

## 2015-02-03 MED ORDER — ONDANSETRON HCL 4 MG/2ML IJ SOLN
4.0000 mg | Freq: Once | INTRAMUSCULAR | Status: AC
  Administered 2015-02-03: 4 mg via INTRAVENOUS

## 2015-02-03 MED ORDER — SODIUM CHLORIDE 0.9 % IV SOLN
INTRAVENOUS | Status: DC
  Administered 2015-02-03 – 2015-02-04 (×2): via INTRAVENOUS

## 2015-02-03 MED ORDER — BISACODYL 5 MG PO TBEC
5.0000 mg | DELAYED_RELEASE_TABLET | Freq: Every day | ORAL | Status: DC | PRN
  Administered 2015-02-08: 5 mg via ORAL
  Filled 2015-02-03 (×2): qty 1

## 2015-02-03 MED ORDER — SODIUM CHLORIDE 0.9 % IV SOLN: 250.0000 mL | INTRAVENOUS | Status: DC | PRN

## 2015-02-03 MED ORDER — ACETAMINOPHEN 650 MG RE SUPP
650.0000 mg | Freq: Four times a day (QID) | RECTAL | Status: DC | PRN
Start: 2015-02-03 — End: 2015-02-10

## 2015-02-03 MED ORDER — HYDROCODONE-ACETAMINOPHEN 5-325 MG PO TABS
1.0000 | ORAL_TABLET | ORAL | Status: DC | PRN
  Administered 2015-02-03 (×2): 2 via ORAL
  Filled 2015-02-03 (×2): qty 2

## 2015-02-03 MED ORDER — FLEET ENEMA 7-19 GM/118ML RE ENEM: 1.0000 | ENEMA | Freq: Once | RECTAL | Status: AC | PRN

## 2015-02-03 MED ORDER — INSULIN ASPART 100 UNIT/ML ~~LOC~~ SOLN
0.0000 [IU] | SUBCUTANEOUS | Status: DC
  Administered 2015-02-03: 2 [IU] via SUBCUTANEOUS
  Administered 2015-02-03: 3 [IU] via SUBCUTANEOUS
  Administered 2015-02-04: 2 [IU] via SUBCUTANEOUS
  Administered 2015-02-04 (×2): 1 [IU] via SUBCUTANEOUS
  Administered 2015-02-04 – 2015-02-05 (×2): 2 [IU] via SUBCUTANEOUS
  Administered 2015-02-05: 1 [IU] via SUBCUTANEOUS
  Administered 2015-02-05: 2 [IU] via SUBCUTANEOUS
  Administered 2015-02-05: 5 [IU] via SUBCUTANEOUS
  Administered 2015-02-05: 2 [IU] via SUBCUTANEOUS
  Administered 2015-02-05 – 2015-02-06 (×2): 3 [IU] via SUBCUTANEOUS
  Administered 2015-02-06: 2 [IU] via SUBCUTANEOUS
  Administered 2015-02-06 (×2): 5 [IU] via SUBCUTANEOUS
  Administered 2015-02-06: 2 [IU] via SUBCUTANEOUS
  Administered 2015-02-06: 5 [IU] via SUBCUTANEOUS
  Administered 2015-02-07: 1 [IU] via SUBCUTANEOUS
  Administered 2015-02-07: 2 [IU] via SUBCUTANEOUS
  Administered 2015-02-07: 3 [IU] via SUBCUTANEOUS
  Administered 2015-02-07: 2 [IU] via SUBCUTANEOUS
  Filled 2015-02-03: qty 3
  Filled 2015-02-03 (×2): qty 2
  Filled 2015-02-03: qty 5
  Filled 2015-02-03: qty 1
  Filled 2015-02-03: qty 2
  Filled 2015-02-03: qty 1
  Filled 2015-02-03 (×3): qty 2
  Filled 2015-02-03: qty 5
  Filled 2015-02-03 (×2): qty 2
  Filled 2015-02-03: qty 5
  Filled 2015-02-03: qty 3
  Filled 2015-02-03: qty 2
  Filled 2015-02-03: qty 1
  Filled 2015-02-03: qty 2
  Filled 2015-02-03: qty 3
  Filled 2015-02-03: qty 5
  Filled 2015-02-03: qty 2
  Filled 2015-02-03: qty 1

## 2015-02-03 MED ORDER — SODIUM CHLORIDE 0.9 % IJ SOLN
3.0000 mL | Freq: Two times a day (BID) | INTRAMUSCULAR | Status: DC
  Administered 2015-02-03 (×2): 3 mL via INTRAVENOUS

## 2015-02-03 MED ORDER — ONDANSETRON HCL 4 MG PO TABS: 4.0000 mg | ORAL_TABLET | Freq: Four times a day (QID) | ORAL | Status: DC | PRN

## 2015-02-03 NOTE — Progress Notes (Signed)
Dr. Darvin Neighbours Pt. To be placed on diabetic diet and given 2 units of insulin due.

## 2015-02-03 NOTE — Consult Note (Signed)
ORTHOPAEDIC CONSULTATION  REQUESTING PHYSICIAN: Hillary Bow, MD  Chief Complaint: Left intertrochanteric hip fracture  HPI: Leslie Parker is a 81 y.o. female who complains of  left hip pain status post fall at home. Patient explains that she was trying to get the remote control for a television out from underneath her recliner. She believes she lost her grip and fell backwards onto her left side. Patient had immediate pain and was unable to stand or ambulate following this injury. Patient has left hip pain but denies any other injuries. She has seen in her hospital room with a friend at the bedside.  Past Medical History  Diagnosis Date  . Diabetes mellitus without complication   . Hypertension   . Asthma   . Impetigo   . Osteopenia   . Osteoarthritis    Past Surgical History  Procedure Laterality Date  . Replacement total knee bilateral    . Hernia repair     History   Social History  . Marital Status: Divorced    Spouse Name: N/A  . Number of Children: N/A  . Years of Education: N/A   Social History Main Topics  . Smoking status: Never Smoker   . Smokeless tobacco: Not on file  . Alcohol Use: No  . Drug Use: No  . Sexual Activity: No   Other Topics Concern  . None   Social History Narrative   Family History  Problem Relation Age of Onset  . Heart failure Mother   . Hypertension Mother    Allergies  Allergen Reactions  . Simvastatin    Prior to Admission medications   Medication Sig Start Date End Date Taking? Authorizing Provider  amLODipine (NORVASC) 10 MG tablet Take 10 mg by mouth daily. 01/05/14  Yes Historical Provider, MD  cyanocobalamin ([employer] VITAMIN B12) 2000 MCG tablet Take 1 tablet by mouth daily.   Yes Historical Provider, MD  Fluticasone-Salmeterol (ADVAIR DISKUS) 500-50 MCG/DOSE AEPB Inhale 1 puff into the lungs 2 (two) times daily. 10/05/14 10/05/15 Yes Historical Provider, MD  metFORMIN (GLUCOPHAGE) 500 MG tablet Take 1 tablet by mouth 2 (two)  times daily. 06/23/14  Yes Historical Provider, MD  Multiple Vitamin (MULTI-VITAMINS) TABS Take 1 tablet by mouth daily.   Yes Historical Provider, MD   Dg Chest Portable 1 View  02/03/2015   CLINICAL DATA:  Left hip fracture secondary to a fall this morning. Pre operative respiratory exam.  EXAM: PORTABLE CHEST - 1 VIEW  COMPARISON:  The 07/13/2014  FINDINGS: The heart size and pulmonary vascularity are normal. Calcification in the thoracic aorta. Minimal scarring at the lung bases. Large hiatal hernia. Emphysema. No acute abnormalities.  IMPRESSION: No acute abnormality. Emphysema. Aortic atherosclerosis. Osteopenia.   Electronically Signed   By: Lorriane Shire M.D.   On: 02/03/2015 13:25   Dg Hip Unilat With Pelvis 2-3 Views Left  02/03/2015   CLINICAL DATA:  Left hip pain secondary to a fall today.  EXAM: DG HIP (WITH OR WITHOUT PELVIS) 2-3V LEFT  COMPARISON:  None.  FINDINGS: There is a comminuted angulated slightly overriding intertrochanteric fracture of the proximal left femur. Diffuse osteopenia. Pelvic bones appear intact.  IMPRESSION: Intertrochanteric fracture of the proximal left femur as described. Osteopenia.   Electronically Signed   By: Lorriane Shire M.D.   On: 02/03/2015 13:19    Positive ROS: All other systems have been reviewed and were otherwise negative with the exception of those mentioned in the HPI and as above.  Physical Exam:  General: Alert, no acute distress Psychiatric: Patient is competent for consent with normal mood and affect  MUSCULOSKELETAL: Left hip: Patient's skin is intact. There is no erythema or ecchymosis or swelling. The thigh and leg compartments are soft and compressible. The left lower extremity shortened and externally rotated. She has palpable pedal pulses and intact sensation light touch. She has intact motor function distally in the left foot. Patient is a healed midline incision from a previous left total knee arthroplasty.  Assessment: Displaced,  comminuted left intertrochanteric hip fracture  Plan: I explained to the patient about her injury. She understands that she has been intertrochanteric hip fracture. I used the white board in her room to diagram the injury and the proposed surgery.    The risks and benefits of surgery were discussed with the patient including but not limited to the risks of infection, bleeding requiring blood transfusion, nerve injury, leg length discrepancy, change in lower extremity rotation, persistent hip pain, and the need for revision surgery. Medical risks include but are not limited to DVT and pulmonary embolism, myocardial infarction, stroke, pneumonia, respiratory failure and death.    Patient understood these risks and wished to proceed with surgery. Her surgery scheduled for tomorrow morning. She has been admitted to the medical service for preoperative clearance. I reviewed the patient's laboratories and radiographic studies in preparation for this case. She'll be nothing by mouth after midnight. Patient will receive narcotic pain medication to keep her comfortable overnight. I answered all the patient's questions.     Thornton Park, MD    02/03/2015 5:53 PM

## 2015-02-03 NOTE — H&P (Signed)
[place] [facility] [place] NAME: Leslie Parker    MR#:  [id number]  DATE OF BIRTH:  1933-03-03  DATE OF ADMISSION:  02/03/2015  PRIMARY CARE PHYSICIAN: Javier Glazier   REQUESTING/REFERRING PHYSICIAN: Nance Pear MD  CHIEF COMPLAINT:   Chief Complaint  Patient presents with  . Fall  . Dislocation    left hip    HISTORY OF PRESENT ILLNESS:  Leslie Parker  is a 81 y.o. female with a known history of hypertension, diabetes, asthma presented to the emergency room after a mechanical fall. Patient's cell phone slid into her recliner yesterday night. Today morning she was trying to move the recliner slipped and fell down landing on her left hip. Did not hit her head. No loss of consciousness. On the x-ray she has been found to have left proximal femur fracture. No other joint pain. No shortness of breath, chest pain. Has good functional status. Last surgery was 6 years back for abdominal hernia which he tolerated well. No prior anesthesia complications. He had a routine stress test 1 year back which is normal.  PAST MEDICAL HISTORY:   Past Medical History  Diagnosis Date  . Diabetes mellitus without complication   . Hypertension   . Asthma   . Impetigo   . Osteopenia   . Osteoarthritis     PAST SURGICAL HISTORY:   Past Surgical History  Procedure Laterality Date  . Replacement total knee bilateral    . Hernia repair       SOCIAL HISTORY:   History  Substance Use Topics  . Smoking status: Never Smoker   . Smokeless tobacco: Not on file  . Alcohol Use: No    FAMILY HISTORY:   Family History  Problem Relation Age of Onset  . Heart failure Mother   . Hypertension Mother     DRUG ALLERGIES:   Allergies  Allergen Reactions  . Simvastatin     REVIEW OF SYSTEMS:   Review of Systems  Constitutional: Negative for fever, chills, weight loss and malaise/fatigue.  HENT: Negative for hearing loss and nosebleeds.    Eyes: Negative for blurred vision, double vision and pain.  Respiratory: Negative for cough, hemoptysis, sputum production, shortness of breath and wheezing.   Cardiovascular: Negative for chest pain, palpitations, orthopnea and leg swelling.  Gastrointestinal: Negative for nausea, vomiting, abdominal pain, diarrhea and constipation.  Genitourinary: Negative for dysuria and hematuria.  Musculoskeletal: Positive for back pain and joint pain. Negative for myalgias and falls.  Skin: Negative for rash.  Neurological: Negative for dizziness, tremors, sensory change, speech change, focal weakness, seizures and headaches.  Endo/Heme/Allergies: Does not bruise/bleed easily.  Psychiatric/Behavioral: Negative for depression and memory loss. The patient is not nervous/anxious.     MEDICATIONS AT HOME:   Prior to Admission medications   Medication Sig Start Date End Date Taking? Authorizing Provider  amLODipine (NORVASC) 10 MG tablet Take 10 mg by mouth daily. 01/05/14  Yes Historical Provider, MD  cyanocobalamin ([facility] VITAMIN B12) 2000 MCG tablet Take 1 tablet by mouth daily.   Yes Historical Provider, MD  Fluticasone-Salmeterol (ADVAIR DISKUS) 500-50 MCG/DOSE AEPB Inhale 1 puff into the lungs 2 (two) times daily. 10/05/14 10/05/15 Yes Historical Provider, MD  metFORMIN (GLUCOPHAGE) 500 MG tablet Take 1 tablet by mouth 2 (two) times daily. 06/23/14  Yes Historical Provider, MD  Multiple Vitamin (MULTI-VITAMINS) TABS Take 1 tablet by mouth daily.   Yes Historical Provider, MD  VITAL SIGNS:  Blood pressure 133/72, pulse 94, temperature 98.1 F (36.7 C), temperature source Oral, height 5\' 9"  (1.753 m), weight 74.844 kg (165 lb), SpO2 91 %.  PHYSICAL EXAMINATION:  Physical Exam  GENERAL:  81 y.o.-year-old patient lying in the bed with no acute distress.  EYES: Pupils equal, round, reactive to light and accommodation. No scleral icterus. Extraocular muscles intact.  HEENT: Head atraumatic,  normocephalic. Oropharynx and nasopharynx clear. No oropharyngeal erythema, moist oral mucosa  NECK:  Supple, no jugular venous distention. No thyroid enlargement, no tenderness.  LUNGS: Normal breath sounds bilaterally, no wheezing, rales, rhonchi. No use of accessory muscles of respiration.  CARDIOVASCULAR: S1, S2 normal. No murmurs, rubs, or gallops.  ABDOMEN: Soft, nontender, nondistended. Bowel sounds present. No organomegaly or mass.  EXTREMITIES: No pedal edema, cyanosis, or clubbing. + 2 pedal & radial pulses b/l.  Tender left hip. Externally rotated NEUROLOGIC: Cranial nerves II through XII are intact. No focal Motor or sensory deficits appreciated b/l PSYCHIATRIC: The patient is alert and oriented x 3. Good affect.  SKIN: No obvious rash, lesion, or ulcer.   LABORATORY PANEL:   CBC  Recent Labs Lab 02/03/15 1237  WBC 7.6  HGB 12.6  HCT 37.1  PLT 187   ------------------------------------------------------------------------------------------------------------------  Chemistries   Recent Labs Lab 02/03/15 1237  NA 135  K 3.9  CL 103  CO2 23  GLUCOSE 183*  BUN 23*  CREATININE 0.90  CALCIUM 8.7*   ------------------------------------------------------------------------------------------------------------------  Cardiac Enzymes No results for input(s): TROPONINI in the last 168 hours. ------------------------------------------------------------------------------------------------------------------  RADIOLOGY:  Dg Chest Portable 1 View  02/03/2015   CLINICAL DATA:  Left hip fracture secondary to a fall this morning. Pre operative respiratory exam.  EXAM: PORTABLE CHEST - 1 VIEW  COMPARISON:  The 07/13/2014  FINDINGS: The heart size and pulmonary vascularity are normal. Calcification in the thoracic aorta. Minimal scarring at the lung bases. Large hiatal hernia. Emphysema. No acute abnormalities.  IMPRESSION: No acute abnormality. Emphysema. Aortic atherosclerosis.  Osteopenia.   Electronically Signed   By: Lorriane Shire M.D.   On: 02/03/2015 13:25   Dg Hip Unilat With Pelvis 2-3 Views Left  02/03/2015   CLINICAL DATA:  Left hip pain secondary to a fall today.  EXAM: DG HIP (WITH OR WITHOUT PELVIS) 2-3V LEFT  COMPARISON:  None.  FINDINGS: There is a comminuted angulated slightly overriding intertrochanteric fracture of the proximal left femur. Diffuse osteopenia. Pelvic bones appear intact.  IMPRESSION: Intertrochanteric fracture of the proximal left femur as described. Osteopenia.   Electronically Signed   By: Lorriane Shire M.D.   On: 02/03/2015 13:19     IMPRESSION AND PLAN:   81 year-old female patient with hypertension, diabetes, asthma presented with mechanical fall and left femur fracture.  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 Due to mechanical fall. Consult orthopedics. Nothing by mouth. Bedrest. DVT prophylaxis per orthopedics. Patient will be low risk for intermediate risk surgery. Pain medications. Dr. Mack Guise of orthopedics is aware of consult.  * Hypertension Continue home medications and add IV when necessary medications.  * Diabetes mellitus type II without complications Hold oral hypoglycemics. Sliding scale insulin.  * Asthma Continue inhalers and as needed nebulizers.  * DVT prophylaxis SCDs. No Lovenox added at this time as patient may get surgery later today. To be Started by orthopedics   All the records are reviewed and case discussed with ED provider. Management plans discussed with the patient, family  and they are in agreement.  CODE STATUS: FULL  TOTAL TIME TAKING CARE OF THIS PATIENT: 40 minutes.    Hillary Bow R M.D on 02/03/2015 at 1:59 PM  Between 7am to 6pm - Pager - [phone]  After 6pm go to [email or web address] - password [email or web address] [facility]  [facility]  Office  [phone]  CC: Primary care physician; Javier Glazier

## 2015-02-03 NOTE — ED Provider Notes (Signed)
[facility] Emergency Department Provider Note    ____________________________________________  Time seen: 1315  I have reviewed the triage vital signs and the nursing notes.   HISTORY  Chief Complaint No chief complaint on file.   History limited by: Not Limited   HPI Leslie Parker is a 81 y.o. female who presents to the emergency department today after mechanical fall. She was getting out of her recliner. She fell onto a hard surface. She had immediate onset of left hip pain. Patient denies any pain or injury to her arms. She denies hitting her head or loss of consciousness. Denies any neck pain.    No past medical history on file.  There are no active problems to display for this patient.   No past surgical history on file.  No current outpatient prescriptions on file.  Allergies Review of patient's allergies indicates not on file.  No family history on file.  Social History History  Substance Use Topics  . Smoking status: Not on file  . Smokeless tobacco: Not on file  . Alcohol Use: Not on file    Review of Systems  Constitutional: Negative for fever. Cardiovascular: Negative for chest pain. Respiratory: Negative for shortness of breath. Gastrointestinal: Negative for abdominal pain, vomiting and diarrhea. Genitourinary: Negative for dysuria. Musculoskeletal: Negative for back pain. Skin: Negative for rash. Neurological: Negative for headaches, focal weakness or numbness.   10-point ROS otherwise negative.  ____________________________________________   PHYSICAL EXAM:  VITAL SIGNS:   98.1 F (36.7 C)  94  --  133/72 mmHg  91 %    Constitutional: Alert and oriented. Well appearing and in no distress. Eyes: Conjunctivae are normal. PERRL. Normal extraocular movements. ENT   Head: Normocephalic and atraumatic.   Nose: No congestion/rhinnorhea.   Mouth/Throat: Mucous membranes are moist.   Neck: No  stridor. No midline tenderness. Hematological/Lymphatic/Immunilogical: No cervical lymphadenopathy. Cardiovascular: Normal rate, regular rhythm.  No murmurs, rubs, or gallops. Respiratory: Normal respiratory effort without tachypnea nor retractions. Breath sounds are clear and equal bilaterally. No wheezes/rales/rhonchi. Gastrointestinal: Soft and nontender. No distention.  Genitourinary: Deferred Musculoskeletal: Left hip with tenderness. Left leg with shortening and external rotation. Neurovascularly intact. No other traumatic injuries identified in any extremities. Hips stable. Neurologic:  Normal speech and language. No gross focal neurologic deficits are appreciated. Speech is normal.  Skin:  Skin is warm, dry and intact. No rash noted. Psychiatric: Mood and affect are normal. Speech and behavior are normal. Patient exhibits appropriate insight and judgment.  ____________________________________________    LABS (pertinent positives/negatives)  Labs Reviewed  CBC WITH DIFFERENTIAL/PLATELET  BASIC METABOLIC PANEL  PROTIME-INR  URINALYSIS COMPLETEWITH MICROSCOPIC ([place])  TYPE AND SCREEN  ABO/RH     ____________________________________________   EKG  I, Nance Pear, attending physician, personally viewed and interpreted this EKG  EKG Time: 1236 Rate: 95 Rhythm: sinus rhythm with 1st degree av block Axis: normal Intervals: qtc 449, 1st degree av block QRS: narrow, q waves, III, aVF, V1, V2, V3 ST changes: no st elevation    ____________________________________________    RADIOLOGY  Chest x-ray IMPRESSION: No acute abnormality. Emphysema. Aortic atherosclerosis. Osteopenia.  Left hip IMPRESSION: Intertrochanteric fracture of the proximal left femur as described. Osteopenia.  I, Roselene Gray, personally viewed and evaluated the left hip x-ray images as part of my medical decision making.    ____________________________________________   PROCEDURES  Procedure(s) performed: None  Critical Care performed: No  ____________________________________________   INITIAL IMPRESSION / ASSESSMENT AND PLAN /  ED COURSE  Pertinent labs & imaging results that were available during my care of the patient were reviewed by me and considered in my medical decision making (see chart for details).  Patient presents to the emergency department today after a fall. X-rays show the patient suffered a left intertrochanteric femoral fracture. No other concerning traumatic injuries identified on physical exam. Will admit to the hospitalist andwith orthopedic surgery.  ____________________________________________   FINAL CLINICAL IMPRESSION(S) / ED DIAGNOSES  Final diagnoses:  Hip fracture, left, closed, initial encounter     Nance Pear, MD 02/03/15 1335

## 2015-02-04 ENCOUNTER — Inpatient Hospital Stay: Payer: Medicare Other

## 2015-02-04 ENCOUNTER — Encounter
Admission: EM | Disposition: A | Discharge status: Nursing Home (Includes ICF) | Payer: Self-pay | Source: Home / Self Care | Attending: Internal Medicine

## 2015-02-04 ENCOUNTER — Inpatient Hospital Stay: Payer: Medicare Other | Admitting: Anesthesiology

## 2015-02-04 ENCOUNTER — Encounter: Payer: Self-pay | Admitting: Orthopedic Surgery

## 2015-02-04 HISTORY — PX: FEMUR IM NAIL: SHX1597

## 2015-02-04 LAB — CBC
HCT: 33.9 % — ABNORMAL LOW (ref 35.0–47.0)
Hemoglobin: 11.5 g/dL — ABNORMAL LOW (ref 12.0–16.0)
MCH: 30 pg (ref 26.0–34.0)
MCHC: 34 g/dL (ref 32.0–36.0)
MCV: 88.2 fL (ref 80.0–100.0)
Platelets: 167 10*3/uL (ref 150–440)
RBC: 3.84 MIL/uL (ref 3.80–5.20)
RDW: 14.1 % (ref 11.5–14.5)
WBC: 5.6 10*3/uL (ref 3.6–11.0)

## 2015-02-04 LAB — BLOOD GAS, ARTERIAL
ACID-BASE DEFICIT: 4.5 mmol/L — AB (ref 0.0–2.0)
Allens test (pass/fail): POSITIVE — AB
Bicarbonate: 20.3 mEq/L — ABNORMAL LOW (ref 21.0–28.0)
FIO2: 0.4 %
O2 Saturation: 79.7 %
PCO2 ART: 36 mmHg (ref 32.0–48.0)
Patient temperature: 37
pH, Arterial: 7.36 (ref 7.350–7.450)
pO2, Arterial: 46 mmHg — ABNORMAL LOW (ref 83.0–108.0)

## 2015-02-04 LAB — ABO/RH: ABO/RH(D): A POS

## 2015-02-04 LAB — BASIC METABOLIC PANEL
ANION GAP: 9 (ref 5–15)
BUN: 19 mg/dL (ref 6–20)
CALCIUM: 8.6 mg/dL — AB (ref 8.9–10.3)
CHLORIDE: 104 mmol/L (ref 101–111)
CO2: 24 mmol/L (ref 22–32)
Creatinine, Ser: 0.94 mg/dL (ref 0.44–1.00)
GFR calc non Af Amer: 56 mL/min — ABNORMAL LOW (ref >60)
GLUCOSE: 136 mg/dL — AB (ref 65–99)
Potassium: 3.8 mmol/L (ref 3.5–5.1)
Sodium: 137 mmol/L (ref 135–145)

## 2015-02-04 LAB — GLUCOSE, CAPILLARY
GLUCOSE-CAPILLARY: 115 mg/dL — AB (ref 65–99)
GLUCOSE-CAPILLARY: 118 mg/dL — AB (ref 65–99)
GLUCOSE-CAPILLARY: 135 mg/dL — AB (ref 65–99)
GLUCOSE-CAPILLARY: 199 mg/dL — AB (ref 65–99)
Glucose-Capillary: 161 mg/dL — ABNORMAL HIGH (ref 65–99)
Glucose-Capillary: 162 mg/dL — ABNORMAL HIGH (ref 65–99)
Glucose-Capillary: 132 mg/dL — ABNORMAL HIGH (ref 65–99)

## 2015-02-04 LAB — MRSA PCR SCREENING: MRSA BY PCR: NEGATIVE

## 2015-02-04 SURGERY — INSERTION, INTRAMEDULLARY ROD, FEMUR
Anesthesia: Spinal | Site: Hip | Laterality: Left | Wound class: Clean

## 2015-02-04 MED ORDER — MAGNESIUM HYDROXIDE 400 MG/5ML PO SUSP: 30.0000 mL | Freq: Every day | ORAL | Status: DC | PRN

## 2015-02-04 MED ORDER — CEFAZOLIN SODIUM-DEXTROSE 2-3 GM-% IV SOLR
INTRAVENOUS | Status: DC | PRN
  Administered 2015-02-04: 2 g via INTRAVENOUS

## 2015-02-04 MED ORDER — ALBUTEROL SULFATE (2.5 MG/3ML) 0.083% IN NEBU
2.5000 mg | INHALATION_SOLUTION | RESPIRATORY_TRACT | Status: DC
  Administered 2015-02-04 – 2015-02-08 (×25): 2.5 mg via RESPIRATORY_TRACT
  Filled 2015-02-04 (×27): qty 3

## 2015-02-04 MED ORDER — ALUM & MAG HYDROXIDE-SIMETH 200-200-20 MG/5ML PO SUSP: 30.0000 mL | ORAL | Status: DC | PRN

## 2015-02-04 MED ORDER — SODIUM CHLORIDE 0.9 % IV SOLN
INTRAVENOUS | Status: DC
  Administered 2015-02-04: 16:00:00 via INTRAVENOUS

## 2015-02-04 MED ORDER — METOCLOPRAMIDE HCL 5 MG/ML IJ SOLN
10.0000 mg | Freq: Once | INTRAMUSCULAR | Status: AC
  Administered 2015-02-04: 10 mg via INTRAVENOUS

## 2015-02-04 MED ORDER — LIDOCAINE HCL (CARDIAC) 20 MG/ML IV SOLN
INTRAVENOUS | Status: DC | PRN
  Administered 2015-02-04: 60 mg via INTRAVENOUS

## 2015-02-04 MED ORDER — MAGNESIUM CITRATE PO SOLN: 1.0000 | Freq: Once | ORAL | Status: AC | PRN

## 2015-02-04 MED ORDER — IPRATROPIUM-ALBUTEROL 0.5-2.5 (3) MG/3ML IN SOLN
3.0000 mL | Freq: Once | RESPIRATORY_TRACT | Status: AC
  Administered 2015-02-04: 3 mL via RESPIRATORY_TRACT

## 2015-02-04 MED ORDER — FERROUS SULFATE 325 (65 FE) MG PO TABS
325.0000 mg | ORAL_TABLET | Freq: Three times a day (TID) | ORAL | Status: DC
  Administered 2015-02-05 – 2015-02-10 (×18): 325 mg via ORAL
  Filled 2015-02-04 (×17): qty 1

## 2015-02-04 MED ORDER — ENOXAPARIN SODIUM 30 MG/0.3ML ~~LOC~~ SOLN
30.0000 mg | Freq: Two times a day (BID) | SUBCUTANEOUS | Status: DC
  Administered 2015-02-05 – 2015-02-10 (×12): 30 mg via SUBCUTANEOUS
  Filled 2015-02-04 (×12): qty 0.3

## 2015-02-04 MED ORDER — OXYCODONE HCL 5 MG PO TABS
5.0000 mg | ORAL_TABLET | ORAL | Status: DC | PRN
  Administered 2015-02-04: 5 mg via ORAL
  Administered 2015-02-05: 10 mg via ORAL
  Administered 2015-02-05 – 2015-02-10 (×16): 5 mg via ORAL
  Filled 2015-02-04 (×5): qty 1
  Filled 2015-02-04: qty 2
  Filled 2015-02-04 (×12): qty 1

## 2015-02-04 MED ORDER — PHENYLEPHRINE HCL 10 MG/ML IJ SOLN
INTRAMUSCULAR | Status: DC | PRN
  Administered 2015-02-04: 100 ug via INTRAVENOUS
  Administered 2015-02-04: 50 ug via INTRAVENOUS
  Administered 2015-02-04: 200 ug via INTRAVENOUS
  Administered 2015-02-04: 50 ug via INTRAVENOUS
  Administered 2015-02-04 (×4): 100 ug via INTRAVENOUS
  Administered 2015-02-04 (×3): 200 ug via INTRAVENOUS
  Administered 2015-02-04: 100 ug via INTRAVENOUS

## 2015-02-04 MED ORDER — MORPHINE SULFATE 2 MG/ML IJ SOLN
1.0000 mg | INTRAMUSCULAR | Status: DC | PRN
  Administered 2015-02-04 – 2015-02-06 (×5): 1 mg via INTRAVENOUS
  Filled 2015-02-04 (×5): qty 1

## 2015-02-04 MED ORDER — METHYLPREDNISOLONE SODIUM SUCC 125 MG IJ SOLR: 60.0000 mg | Freq: Four times a day (QID) | INTRAMUSCULAR | Status: DC

## 2015-02-04 MED ORDER — PHENOL 1.4 % MT LIQD: 1.0000 | OROMUCOSAL | Status: DC | PRN

## 2015-02-04 MED ORDER — BUPIVACAINE HCL (PF) 0.5 % IJ SOLN
INTRAMUSCULAR | Status: DC | PRN
Start: 2015-02-04 — End: 2015-02-04
  Administered 2015-02-04: 3 mL

## 2015-02-04 MED ORDER — ONDANSETRON HCL 4 MG/2ML IJ SOLN: 4.0000 mg | Freq: Once | INTRAMUSCULAR | Status: AC | PRN

## 2015-02-04 MED ORDER — FENTANYL CITRATE (PF) 100 MCG/2ML IJ SOLN
INTRAMUSCULAR | Status: DC | PRN
  Administered 2015-02-04 (×3): 25 ug via INTRAVENOUS

## 2015-02-04 MED ORDER — MENTHOL 3 MG MT LOZG: 1.0000 | LOZENGE | OROMUCOSAL | Status: DC | PRN

## 2015-02-04 MED ORDER — NEOMYCIN-POLYMYXIN B GU 40-200000 IR SOLN
Status: DC | PRN
  Administered 2015-02-04: 2 mL

## 2015-02-04 MED ORDER — ONDANSETRON HCL 4 MG PO TABS
4.0000 mg | ORAL_TABLET | Freq: Four times a day (QID) | ORAL | Status: DC | PRN
  Administered 2015-02-05: 4 mg via ORAL
  Filled 2015-02-04: qty 1

## 2015-02-04 MED ORDER — LACTATED RINGERS IV SOLN
INTRAVENOUS | Status: DC | PRN
  Administered 2015-02-04: 10:00:00 via INTRAVENOUS

## 2015-02-04 MED ORDER — ONDANSETRON HCL 4 MG/2ML IJ SOLN
4.0000 mg | Freq: Four times a day (QID) | INTRAMUSCULAR | Status: DC | PRN
  Administered 2015-02-07: 4 mg via INTRAVENOUS
  Filled 2015-02-04: qty 2

## 2015-02-04 MED ORDER — PROPOFOL 10 MG/ML IV BOLUS
INTRAVENOUS | Status: DC | PRN
  Administered 2015-02-04 (×2): 10 mg via INTRAVENOUS

## 2015-02-04 MED ORDER — TIOTROPIUM BROMIDE MONOHYDRATE 18 MCG IN CAPS
18.0000 ug | ORAL_CAPSULE | Freq: Every day | RESPIRATORY_TRACT | Status: DC
  Administered 2015-02-05 – 2015-02-10 (×6): 18 ug via RESPIRATORY_TRACT
  Filled 2015-02-04 (×2): qty 5

## 2015-02-04 MED ORDER — CEFAZOLIN SODIUM 1-5 GM-% IV SOLN
1.0000 g | Freq: Four times a day (QID) | INTRAVENOUS | Status: AC
  Administered 2015-02-04 – 2015-02-05 (×3): 1 g via INTRAVENOUS
  Filled 2015-02-04 (×3): qty 50

## 2015-02-04 MED ORDER — PROPOFOL INFUSION 10 MG/ML OPTIME
INTRAVENOUS | Status: DC | PRN
  Administered 2015-02-04: 20 ug/kg/min via INTRAVENOUS

## 2015-02-04 MED ORDER — FENTANYL CITRATE (PF) 100 MCG/2ML IJ SOLN: 25.0000 ug | INTRAMUSCULAR | Status: DC | PRN

## 2015-02-04 SURGICAL SUPPLY — 38 items
BAG COUNTER SPONGE EZ (MISCELLANEOUS) IMPLANT
BIT DRILL 4.3MMS DISTAL GRDTED (BIT) ×2 IMPLANT
BIT DRILL AO GAMMA 4.2X180 (BIT) ×3 IMPLANT
BIT DRILL AO GAMMA 4.2X300 (BIT) ×3 IMPLANT
CANISTER SUCT 1200ML W/VALVE (MISCELLANEOUS) ×3 IMPLANT
COUNTER SPONGE BAG EZ (MISCELLANEOUS)
DRAPE SURG 17X11 SM STRL (DRAPES) ×3 IMPLANT
DRAPE U-SHAPE 47X51 STRL (DRAPES) ×3 IMPLANT
DRILL 4.3MMS DISTAL GRADUATED (BIT) ×6
DRSG OPSITE POSTOP 4X10 (GAUZE/BANDAGES/DRESSINGS) ×3 IMPLANT
DURAPREP 26ML APPLICATOR (WOUND CARE) ×3 IMPLANT
GAUZE PETRO XEROFOAM 1X8 (MISCELLANEOUS) ×3 IMPLANT
GLOVE SURG 9.0 ORTHO LTXF (GLOVE) ×12 IMPLANT
GOWN STRL REUS TWL 2XL XL LVL4 (GOWN DISPOSABLE) ×3 IMPLANT
GOWN STRL REUS W/ TWL LRG LVL3 (GOWN DISPOSABLE) ×1 IMPLANT
GOWN STRL REUS W/TWL LRG LVL3 (GOWN DISPOSABLE) ×2
GUIDEPIN VERSANAIL DSP 3.2X444 ×3 IMPLANT
GUIDEROD T2 3X1000 (ROD) ×3 IMPLANT
GUIDEWIRE BALL NOSE 80CM (WIRE) ×3 IMPLANT
HEMOVAC 400CC 10FR (MISCELLANEOUS) ×3 IMPLANT
K-WIRE  3.2X450M STR (WIRE) ×2
K-WIRE 3.2X450M STR (WIRE) ×1
KIT RM TURNOVER CYSTO AR (KITS) ×3 IMPLANT
KWIRE 3.2X450M STR (WIRE) ×1 IMPLANT
NAIL HIP FRACT LT 130D 11X400 (Nail) ×3 IMPLANT
NS IRRIG 500ML POUR BTL (IV SOLUTION) ×3 IMPLANT
PACK HIP COMPR (MISCELLANEOUS) ×3 IMPLANT
PAD GROUND ADULT SPLIT (MISCELLANEOUS) ×3 IMPLANT
REAMER ROD DEEP FLUTE 2.5X950 (INSTRUMENTS) ×3 IMPLANT
REAMER SHAFT BIXCUT (INSTRUMENTS) ×3 IMPLANT
SCREW BONE CORTICAL 5.0X50 (Screw) ×3 IMPLANT
SCREW LAG 10.5MMX105MM HFN (Screw) ×3 IMPLANT
STAPLER SKIN PROX 35W (STAPLE) ×3 IMPLANT
SUCTION FRAZIER TIP 10 FR DISP (SUCTIONS) ×3 IMPLANT
SUT VIC AB 0 CT1 36 (SUTURE) ×3 IMPLANT
SUT VIC AB 2-0 CT1 27 (SUTURE) ×2
SUT VIC AB 2-0 CT1 TAPERPNT 27 (SUTURE) ×1 IMPLANT
SYR 30ML LL (SYRINGE) ×3 IMPLANT

## 2015-02-04 NOTE — Transfer of Care (Signed)
Immediate Anesthesia Transfer of Care Note  Patient: Leslie Parker  Procedure(s) Performed: Procedure(s): INTRAMEDULLARY (IM) NAIL FEMORAL (Left)  Patient Location: PACU  Anesthesia Type:Spinal  Level of Consciousness: awake, alert  and oriented  Airway & Oxygen Therapy: Patient Spontanous Breathing and Patient connected to face mask oxygen  Post-op Assessment: Report given to RN and Post -op Vital signs reviewed and stable  Post vital signs: Reviewed and stable  Last Vitals:  Filed Vitals:   02/04/15 1202  BP: 106/56  Pulse: 88  Temp: 36.6 C  Resp: 21    Complications: No apparent anesthesia complications

## 2015-02-04 NOTE — Care Management Note (Signed)
Case Management Note  Patient Details  Name: Leslie Parker MRN: [id number] Date of Birth: 1932-11-08  Subjective/Objective:                  Patient pending admission to orthopedics post op.   Action/Plan: Met with patients daughter after speaking with Dr. Mack Guise about discharge planning. Patient is currently not present as she has not been received from PACU at time of my visit. Her daughter lives in [place] and works at an ALF which is connected to SNF. She states patient could decide but she would like patient closer to [place]. Patient lives on [address] In a handicap accessible apartment- alone. She has a walker and her daughter will bring it to the hospital for review. Patient was independent prior to this injury- PT pending. [phone] according to her daughter. Per Dr. Mack Guise if patient is able to return home she will need Lovenox 1m daily SQ for 4 weeks. If she goes to SNF he would like her to be on Lovenox [id number] SQ.List of home health agencies left with patients daughter.   Expected Discharge Date:  02/07/15               Expected Discharge Plan:     In-House Referral:  Clinical Social Work  Discharge planning Services  CM Consult  Post Acute Care Choice:    Choice offered to:  Adult Children  DME Arranged:    DME Agency:     HH Arranged:    HH Agency:     Status of Service:     Medicare Important Message Given:    Date Medicare IM Given:    Medicare IM give by:    Date Additional Medicare IM Given:    Additional Medicare Important Message give by:     If discussed at [place] Meetings, dates discussed:    Additional Comments:  AnMarshell GarfinkelRN 02/04/2015, 2:17 PM

## 2015-02-04 NOTE — Progress Notes (Addendum)
[place] at [place] NAME: Leslie Parker    MR#:  [id number]  DATE OF BIRTH:  [birth year]  SUBJECTIVE:  CHIEF COMPLAINT:   Chief Complaint  Patient presents with  . Fall  . Dislocation    left hip   patient is 81-year-old Caucasian female with history of diabetes, asthma, who presents to the hospital after a fall at home with left hip pain. She was not able to ambulate or stand after injury. On arrival to emergency room, her x-ray revealed intertrochanteric fracture of the proximal left femur, operations, planned by Dr. Mack Guise for which patient is agreeable. Patient admits of the living alone at home and is active in her daily life, and I think any pains in the chest in the past or ever having any heart attacks. Had cardiac evaluation approximately 4 years ago which was completely normal.   Review of Systems  Constitutional: Negative for fever, chills and weight loss.  HENT: Negative for congestion.   Eyes: Negative for blurred vision and double vision.  Respiratory: Negative for cough, sputum production, shortness of breath and wheezing.   Cardiovascular: Negative for chest pain, palpitations, orthopnea, leg swelling and PND.  Gastrointestinal: Negative for nausea, vomiting, abdominal pain, diarrhea, constipation and blood in stool.  Genitourinary: Negative for dysuria, urgency, frequency and hematuria.  Musculoskeletal: Negative for falls.  Neurological: Negative for dizziness, tremors, focal weakness and headaches.  Endo/Heme/Allergies: Does not bruise/bleed easily.  Psychiatric/Behavioral: Negative for depression. The patient does not have insomnia.     VITAL SIGNS: Blood pressure 144/67, pulse 84, temperature 98.7 F (37.1 C), temperature source Oral, resp. rate 18, height 5\' 9"  (1.753 m), weight 74.844 kg (165 lb), SpO2 90 %.  PHYSICAL EXAMINATION:   GENERAL:  81 y.o.-year-old patient lying in the bed with no acute distress.  Uncomfortable due to left hip pain EYES: Pupils equal, round, reactive to light and accommodation. No scleral icterus. Extraocular muscles intact.  HEENT: Head atraumatic, normocephalic. Oropharynx and nasopharynx clear.  NECK:  Supple, no jugular venous distention. No thyroid enlargement, no tenderness.  LUNGS: Normal breath sounds bilaterally, no wheezing, rales,rhonchi or crepitation. No use of accessory muscles of respiration.  CARDIOVASCULAR: S1, S2 normal. No murmurs, rubs, or gallops.  ABDOMEN: Soft, nontender, nondistended. Bowel sounds present. No organomegaly or mass.  EXTREMITIES: No pedal edema, cyanosis, or clubbing.  NEUROLOGIC: Cranial nerves II through XII are intact. Muscle strength 5/5 in all extremities. Sensation intact. Gait not checked.  PSYCHIATRIC: The patient is alert and oriented x 3. Left lower extremity is externally rotated and shortened. Swollen left hip area, thigh SKIN: No obvious rash, lesion, or ulcer.   ORDERS/RESULTS REVIEWED:   CBC  Recent Labs Lab 02/03/15 1237 02/04/15 0603  WBC 7.6 5.6  HGB 12.6 11.5*  HCT 37.1 33.9*  PLT 187 167  MCV 87.8 88.2  MCH 29.9 30.0  MCHC 34.0 34.0  RDW 13.9 14.1  LYMPHSABS 1.2  --   MONOABS 0.4  --   EOSABS 0.3  --   BASOSABS 0.0  --    ------------------------------------------------------------------------------------------------------------------  Chemistries   Recent Labs Lab 02/03/15 1237 02/04/15 0603  NA 135 137  K 3.9 3.8  CL 103 104  CO2 23 24  GLUCOSE 183* 136*  BUN 23* 19  CREATININE 0.90 0.94  CALCIUM 8.7* 8.6*   ------------------------------------------------------------------------------------------------------------------ estimated creatinine clearance is 49.9 mL/min (by C-G formula based on Cr of 0.94). ------------------------------------------------------------------------------------------------------------------ No results for input(s): TSH, T4TOTAL,  T3FREE, THYROIDAB in the  last 72 hours.  Invalid input(s): FREET3  Cardiac Enzymes No results for input(s): CKMB, TROPONINI, MYOGLOBIN in the last 168 hours.  Invalid input(s): CK ------------------------------------------------------------------------------------------------------------------ Invalid input(s): POCBNP ---------------------------------------------------------------------------------------------------------------  RADIOLOGY: Dg Chest Portable 1 View  02/03/2015   CLINICAL DATA:  Left hip fracture secondary to a fall this morning. Pre operative respiratory exam.  EXAM: PORTABLE CHEST - 1 VIEW  COMPARISON:  The 07/13/2014  FINDINGS: The heart size and pulmonary vascularity are normal. Calcification in the thoracic aorta. Minimal scarring at the lung bases. Large hiatal hernia. Emphysema. No acute abnormalities.  IMPRESSION: No acute abnormality. Emphysema. Aortic atherosclerosis. Osteopenia.   Electronically Signed   By: Lorriane Shire M.D.   On: 02/03/2015 13:25   Dg Hip Unilat With Pelvis 2-3 Views Left  02/03/2015   CLINICAL DATA:  Left hip pain secondary to a fall today.  EXAM: DG HIP (WITH OR WITHOUT PELVIS) 2-3V LEFT  COMPARISON:  None.  FINDINGS: There is a comminuted angulated slightly overriding intertrochanteric fracture of the proximal left femur. Diffuse osteopenia. Pelvic bones appear intact.  IMPRESSION: Intertrochanteric fracture of the proximal left femur as described. Osteopenia.   Electronically Signed   By: Lorriane Shire M.D.   On: 02/03/2015 13:19    EKG:  Orders placed or performed during the hospital encounter of 02/03/15  . EKG 12-Lead  . EKG 12-Lead    ASSESSMENT AND PLAN:  Active Problems:   Femur fracture, left   Hypertension 1. Post intertrochanteric left femur fracture. Initial encounter, the pain management. Patient has moderate perioperative risks due to diabetes, hypertension, also asthma, but she had recent cardiac evaluation about 4 years ago which was completely  within normal limits per her recollection. She is also active in her daily life. Discussed this patient. Risks as well as benefits of operation and she is agreeable to proceed 2. Hypoxia. Continue patient on oxygen therapy,  initiated on DuoNeb nebs, add steroids, IV, Tiotropium and Dulera. Emphysema on chest x-ray, however, no active infection noted clinically or radiologically 3. Hypertension. Continue outpatient management. Follow blood pressure readings 4. Anemia. Follow after operation. Transfuse patient as needed 5. Diabetes mellitus type 2. Continue outpatient management as well as sliding scale insulin. The patient's blood glucose level is around 120 today in the morning Management plans discussed with the patient, family and they are in agreement.   DRUG ALLERGIES:  Allergies  Allergen Reactions  . Simvastatin     CODE STATUS:     Code Status Orders        Start     Ordered   02/03/15 1752  Full code   Continuous     02/03/15 1751      TOTAL TIME TAKING CARE OF THIS PATIENT: 40  minutes.    Theodoro Grist M.D on 02/04/2015 at 10:52 AM  Between 7am to 6pm - Pager - [phone]  After 6pm go to [email or web address] - password [email or web address] [facility]  [place]  Office  [phone]  CC: Primary care physician; Javier Glazier

## 2015-02-04 NOTE — Progress Notes (Signed)
Pt transported to OR

## 2015-02-04 NOTE — Anesthesia Procedure Notes (Addendum)
Spinal Patient location during procedure: OR Start time: 02/04/2015 10:12 AM End time: 02/04/2015 10:25 AM Staffing Performed by: anesthesiologist  Preanesthetic Checklist Completed: patient identified, site marked, surgical consent, pre-op evaluation, timeout performed, IV checked, risks and benefits discussed and monitors and equipment checked Spinal Block Patient position: sitting Prep: Betadine Patient monitoring: heart rate, continuous pulse ox, blood pressure and cardiac monitor Approach: midline Location: L4-5 Injection technique: single-shot Needle Needle type: Whitacre and Introducer  Needle gauge: 25 G Needle length: 9 cm Additional Notes Negative paresthesia. Negative blood return. Positive free-flowing CSF. Expiration date of kit checked and confirmed. Patient tolerated procedure well, without complications.    Procedure Name: MAC Performed by: Demetrius Charity Pre-anesthesia Checklist: Patient identified, Emergency Drugs available, Suction available, Patient being monitored and Timeout performed Oxygen Delivery Method: Simple face mask

## 2015-02-04 NOTE — Progress Notes (Signed)
Patient going to ICU from PACU.  Called gave report to [facility].  ICU 01

## 2015-02-04 NOTE — Progress Notes (Signed)
Patient had surgery today with Dr. Mack Guise. Patient transferred to ICU. CSW made ICU CSW aware of above. Clinical Social Worker (CSW) is following and will assist as needed.  Blima Rich, [place] [phone]

## 2015-02-04 NOTE — Progress Notes (Signed)
Patient alert and oriented, vss except on venti mask, currently at pulse ox in mid 90s but desat easily when mask taken off.  Left incision dressings c/d/i.  Foley in place and patent with 27ml of urine this transfer to unit.  Patient currently in no apparent distress.  See flowsheets for further details.

## 2015-02-04 NOTE — Op Note (Signed)
DATE OF SURGERY:  02/04/2015  TIME: 12:20 PM  PATIENT NAME:  Leslie Parker  AGE: 81 y.o.  PRE-OPERATIVE DIAGNOSIS:  fractured left hip  POST-OPERATIVE DIAGNOSIS:  SAME  PROCEDURE:  INTRAMEDULLARY (IM) NAIL FEMORAL  SURGEON:  Dasean Brow  OPERATIVE IMPLANTS: Biomet long Affixus nail 11x472mm, 105 mm lag screw with a 50 mm distal interlocking screw  PREOPERATIVE INDICATIONS:  Leslie Parker is a 81 y.o. year old who fell and suffered a hip fracture. She was brought into the ER and then admitted and medically cleared for surgical intervention.    The risks, benefits and alternatives were discussed with the patient and their family.  The risks include but are not limited to infection, bleeding, nerve or blood vessel injury, malunion, nonunion, hardware prominence, hardware failure, change in leg lengths or lower extremity rotation need for further surgery including hardware removal with conversion to a total hip arthroplasty. Medical risks include but are not limited to DVT and pulmonary embolism, myocardial infarction, stroke, pneumonia, respiratory failure and death. The patient and their family understood these risks and wished to proceed with surgery.  OPERATIVE PROCEDURE:  The patient was brought to the operating room and placed in the supine position on the fracture table.. General anesthesia was administered. She.  A closed reduction was performed under C-arm guidance.  The fracture reduction was confirmed on both AP and lateral views. A time out was performed to verify the patient's name, date of birth, medical record number, correct site of surgery correct procedure to be performed. The timeout was also used to verify the patient received antibiotics and all appropriate instruments, implants and radiographic studies were available in the room. Once all in attendance were in agreement, the case began. The patient was prepped and draped in a sterile fashion. She received preoperative  antibiotics.  An incision was made proximal to the greater trochanter in line with the femur. A guidewire was placed over the tip of the greater trochanter and advanced into the proximal femur to the level of the lesser trochanter.  Confirmation of the drill pin position was made on AP and lateral C-arm images.  The threaded guidepin was then overdrilled with the proximal femoral drill.  The nail was then inserted into the proximal femur, across the fracture site and into the femoral shaft. Its position was confirmed on AP and lateral C-arm images. Drill pin was then exchanged for a long ball-tipped guidewire. This was advanced down the femoral shaft. Position was confirmed on AP and lateral C-arm images both proximally and distally at the knee. The patient has a total knee arthroplasty prosthesis. The depth of the ball-tipped guidewire was then measured with a depth gauge. The decision was made to insert a 400 mm x 11 mm long Affixus nail. This intramedullary nail was inserted over top of the long ball-tipped guidewire.  Once the nail was completely seated, the drill guide for the lag screw was placed through the guide arm for the Affixus nail. A guidepin was then placed through this drill guide and advanced through the lateral cortex of the femur, across the fracture site and into the femoral head achieving a tip apex distance of less than 25 mm. The length of the drill pin was measured, and then the drill for the lag screw was advanced through the lateral cortex, across the fracture site and up into the femoral head to the depth of the lag screw..  The lag screw was then advanced by hand into  position across the fracture site into the femoral head. Its final position was confirmed on AP and lateral C-arm images. Compression was applied as traction was carefully released. The set screw in the top of the intramedullary rod was tightened by hand using a screwdriver.   A distal interlocking screw was placed  through the static screw hole. This was placed using a freehand technique. A small stab incision was made to allow for the drill to be placed alongside the lateral femur. The drill was advanced bicortically after its position was confirmed on a perfect circle technique with the C-arm. It was measured to 50 mm in length. The distal interlocking screw was advanced by hand into position.  Final C-arm images of the entire intramedullary construct were taken in both the AP and lateral planes.   The wounds were irrigated copiously and closed with 0 Vicryl for closure of the deep fashion and 2-0 Vicryl for his subcutaneous closure. The skin was approximated with staples. A dry sterile dressing was applied. I was scrubbed and present the entire case and all sharp and instrument counts were correct at the conclusion of the case. Patient was transferred to hospital bed and brought to PACU in stable condition. I spoke with the patient's family in the postop consultation room to let them know the case had gone without complication and the patient was stable in the recovery room.  She will be partial weightbearing and begin physical and occupational therapy tomorrow. The patient will started on medical DVT prophylaxis tomorrow.    Timoteo Gaul, MD

## 2015-02-04 NOTE — Plan of Care (Signed)
Problem: Consults Goal: Diagnosis- Total Joint Replacement Outcome: Progressing Primary Total Hip     

## 2015-02-04 NOTE — Anesthesia Preprocedure Evaluation (Addendum)
Anesthesia Evaluation  Patient identified by MRN, date of birth, ID band Patient awake    Reviewed: Allergy & Precautions, NPO status , Patient's Chart, lab work & pertinent test results  History of Anesthesia Complications Negative for: history of anesthetic complications  Airway Mallampati: III  TM Distance: >3 FB Neck ROM: Full    Dental  (+) Upper Dentures, Lower Dentures   Pulmonary asthma ,          Cardiovascular hypertension, Pt. on medications + Valvular Problems/Murmurs (murmer)     Neuro/Psych    GI/Hepatic GERD-  Medicated and Controlled,  Endo/Other  diabetes, Type 2, Oral Hypoglycemic Agents  Renal/GU      Musculoskeletal   Abdominal   Peds  Hematology   Anesthesia Other Findings   Reproductive/Obstetrics                            Anesthesia Physical Anesthesia Plan  ASA: III  Anesthesia Plan: Spinal   Post-op Pain Management:    Induction:   Airway Management Planned:   Additional Equipment:   Intra-op Plan:   Post-operative Plan:   Informed Consent: I have reviewed the patients History and Physical, chart, labs and discussed the procedure including the risks, benefits and alternatives for the proposed anesthesia with the patient or authorized representative who has indicated his/her understanding and acceptance.     Plan Discussed with:   Anesthesia Plan Comments:         Anesthesia Quick Evaluation

## 2015-02-05 ENCOUNTER — Inpatient Hospital Stay: Payer: Medicare Other

## 2015-02-05 LAB — BASIC METABOLIC PANEL
ANION GAP: 8 (ref 5–15)
BUN: 22 mg/dL — ABNORMAL HIGH (ref 6–20)
CHLORIDE: 104 mmol/L (ref 101–111)
CO2: 22 mmol/L (ref 22–32)
Calcium: 8 mg/dL — ABNORMAL LOW (ref 8.9–10.3)
Creatinine, Ser: 1.21 mg/dL — ABNORMAL HIGH (ref 0.44–1.00)
GFR calc non Af Amer: 41 mL/min — ABNORMAL LOW (ref >60)
GFR, EST AFRICAN AMERICAN: 48 mL/min — AB (ref >60)
Glucose, Bld: 139 mg/dL — ABNORMAL HIGH (ref 65–99)
Potassium: 3.9 mmol/L (ref 3.5–5.1)
Sodium: 134 mmol/L — ABNORMAL LOW (ref 135–145)

## 2015-02-05 LAB — GLUCOSE, CAPILLARY
GLUCOSE-CAPILLARY: 151 mg/dL — AB (ref 65–99)
GLUCOSE-CAPILLARY: 153 mg/dL — AB (ref 65–99)
Glucose-Capillary: 123 mg/dL — ABNORMAL HIGH (ref 65–99)
Glucose-Capillary: 191 mg/dL — ABNORMAL HIGH (ref 65–99)
Glucose-Capillary: 217 mg/dL — ABNORMAL HIGH (ref 65–99)
Glucose-Capillary: 298 mg/dL — ABNORMAL HIGH (ref 65–99)

## 2015-02-05 LAB — CBC
HCT: 29 % — ABNORMAL LOW (ref 35.0–47.0)
Hemoglobin: 9.8 g/dL — ABNORMAL LOW (ref 12.0–16.0)
MCH: 29.8 pg (ref 26.0–34.0)
MCHC: 33.8 g/dL (ref 32.0–36.0)
MCV: 88.3 fL (ref 80.0–100.0)
Platelets: 147 10*3/uL — ABNORMAL LOW (ref 150–440)
RBC: 3.29 MIL/uL — ABNORMAL LOW (ref 3.80–5.20)
RDW: 14.2 % (ref 11.5–14.5)
WBC: 7.1 10*3/uL (ref 3.6–11.0)

## 2015-02-05 MED ORDER — PREDNISONE 20 MG PO TABS
50.0000 mg | ORAL_TABLET | Freq: Every day | ORAL | Status: AC
  Administered 2015-02-05 – 2015-02-09 (×5): 50 mg via ORAL
  Filled 2015-02-05: qty 3
  Filled 2015-02-05: qty 2
  Filled 2015-02-05 (×3): qty 3

## 2015-02-05 MED ORDER — CETYLPYRIDINIUM CHLORIDE 0.05 % MT LIQD
7.0000 mL | Freq: Two times a day (BID) | OROMUCOSAL | Status: DC
  Administered 2015-02-05 – 2015-02-10 (×10): 7 mL via OROMUCOSAL

## 2015-02-05 NOTE — Progress Notes (Addendum)
o2 sats down to 71 then up to 88% on 6liters . Positive dyspnea when taken off ventimask. md orders transfer pt back to stepdown. Report called back to [facility] in ccu. md reports he wants to keep sats 92 or above

## 2015-02-05 NOTE — Plan of Care (Signed)
Problem: Phase II Progression Outcomes Goal: Bed to chair Outcome: Not Met (add Reason) Unstable o2 sat

## 2015-02-05 NOTE — Progress Notes (Addendum)
rn spoke with dr sudini . Pt is 94% on 50% ventimask . md reports continue to assess pt for the next 2 hrs. rn supervisor cheryl informed of pt condition

## 2015-02-05 NOTE — Progress Notes (Addendum)
Pt transferred to floor  With low o2 sats.rn spoke with dr sudini. o2 sat down to 79% on 4 liters . o2 sat jumping around up to 85% on 6 liters. md orders give svn tx now and call back status. Respiratory tx called to check pt. rn supervisor cheryl notified of status

## 2015-02-05 NOTE — Progress Notes (Signed)
Subjective:  Postop day 1 status post left intramedullary rod fixation for intertrochanteric hip fracture. Patient has been transferred out of the ICU and back to the orthopedic floor today. She still is on a rebreather mask. Patient reports pain as mild.  She denies shortness of breath chest pain or abdominal pain. She states he just feels "tired".  Objective:   VITALS:   Filed Vitals:   02/05/15 1333 02/05/15 1334 02/05/15 1350 02/05/15 1355  BP:      Pulse:      Temp:      TempSrc:      Resp:      Height:      Weight:      SpO2: 79% 85% 89% 93%   Left hip/lower extremity: Patient's dressing is clean dry and intact. Her thigh is soft and compressible. There is no erythema or ecchymosis or drainage from her incision. She is a honeycomb dressing in place and the incision is well visualized through this dressing. She has no calf tenderness or lower leg edema. Her leg compartments are soft and compressible as well. She has intact sensation light touch in palpable pedal pulses. Her foot is well perfused. She has 5 out of 5 strength in all muscle groups of the left lower extremity on exam today.   LABS  Results for orders placed or performed during the hospital encounter of 02/03/15 (from the past 24 hour(s))  Glucose, capillary     Status: Abnormal   Collection Time: 02/04/15  2:37 PM  Result Value Ref Range   Glucose-Capillary 161 (H) 65 - 99 mg/dL  Glucose, capillary     Status: Abnormal   Collection Time: 02/04/15  4:55 PM  Result Value Ref Range   Glucose-Capillary 162 (H) 65 - 99 mg/dL  Glucose, capillary     Status: Abnormal   Collection Time: 02/04/15  8:14 PM  Result Value Ref Range   Glucose-Capillary 132 (H) 65 - 99 mg/dL  Glucose, capillary     Status: Abnormal   Collection Time: 02/05/15 12:23 AM  Result Value Ref Range   Glucose-Capillary 153 (H) 65 - 99 mg/dL  Glucose, capillary     Status: Abnormal   Collection Time: 02/05/15  3:47 AM  Result Value Ref Range   Glucose-Capillary 123 (H) 65 - 99 mg/dL  CBC     Status: Abnormal   Collection Time: 02/05/15  6:39 AM  Result Value Ref Range   WBC 7.1 3.6 - 11.0 K/uL   RBC 3.29 (L) 3.80 - 5.20 MIL/uL   Hemoglobin 9.8 (L) 12.0 - 16.0 g/dL   HCT 29.0 (L) 35.0 - 47.0 %   MCV 88.3 80.0 - 100.0 fL   MCH 29.8 26.0 - 34.0 pg   MCHC 33.8 32.0 - 36.0 g/dL   RDW 14.2 11.5 - 14.5 %   Platelets 147 (L) 150 - 440 K/uL  Basic metabolic panel     Status: Abnormal   Collection Time: 02/05/15  6:39 AM  Result Value Ref Range   Sodium 134 (L) 135 - 145 mmol/L   Potassium 3.9 3.5 - 5.1 mmol/L   Chloride 104 101 - 111 mmol/L   CO2 22 22 - 32 mmol/L   Glucose, Bld 139 (H) 65 - 99 mg/dL   BUN 22 (H) 6 - 20 mg/dL   Creatinine, Ser 1.21 (H) 0.44 - 1.00 mg/dL   Calcium 8.0 (L) 8.9 - 10.3 mg/dL   GFR calc non Af Amer 41 (L) >60 mL/min  GFR calc Af Amer 48 (L) >60 mL/min   Anion gap 8 5 - 15  Glucose, capillary     Status: Abnormal   Collection Time: 02/05/15  7:28 AM  Result Value Ref Range   Glucose-Capillary 151 (H) 65 - 99 mg/dL  Glucose, capillary     Status: Abnormal   Collection Time: 02/05/15 11:54 AM  Result Value Ref Range   Glucose-Capillary 191 (H) 65 - 99 mg/dL    Dg Chest Port 1 View  02/04/2015   CLINICAL DATA:  Decreased oxygen levels and difficulty breathing following recent left femur ORIF  EXAM: PORTABLE CHEST - 1 VIEW  COMPARISON:  02/03/2015  FINDINGS: Cardiac shadow is within normal limits. Postsurgical changes are again seen in the right shoulder. The lungs are well aerated. No pneumothorax or sizable infiltrate is seen. No bony abnormality is noted.  IMPRESSION: No acute abnormality seen.   Electronically Signed   By: Inez Catalina M.D.   On: 02/04/2015 14:16   Dg C-arm 61-120 Min  02/04/2015   CLINICAL DATA:  ORIF left femur  EXAM: LEFT FEMUR 1 VIEW; DG C-ARM 61-120 MIN  FLUOROSCOPY TIME:  2 minutes 42 seconds  COMPARISON:  None.  FINDINGS: Intraoperative fluoroscopic radiographs during  ORIF of a left femur fracture.  Status post left knee arthroplasty.  IMPRESSION: Intraoperative fluoroscopic radiographs during ORIF of a left femur fracture.   Electronically Signed   By: Julian Hy M.D.   On: 02/04/2015 12:16   Dg Hip Port Unilat With Pelvis 1v Left  02/04/2015   CLINICAL DATA:  Status post ORIF  EXAM: DG HIP (WITH OR WITHOUT PELVIS) 1V PORT LEFT  COMPARISON:  02/03/2015  FINDINGS: The patient is status post open reduction and internal fixation of the proximal left femur. There is an intra medullary rod and screw device noted. Gas is noted within the surrounding soft tissues. Fracture fragments are in anatomic alignment.  IMPRESSION: 1. Status post ORIF of proximal femur fracture.   Electronically Signed   By: Kerby Moors M.D.   On: 02/04/2015 14:10   Dg Femur 1v Left  02/04/2015   CLINICAL DATA:  ORIF left femur  EXAM: LEFT FEMUR 1 VIEW; DG C-ARM 61-120 MIN  FLUOROSCOPY TIME:  2 minutes 42 seconds  COMPARISON:  None.  FINDINGS: Intraoperative fluoroscopic radiographs during ORIF of a left femur fracture.  Status post left knee arthroplasty.  IMPRESSION: Intraoperative fluoroscopic radiographs during ORIF of a left femur fracture.   Electronically Signed   By: Julian Hy M.D.   On: 02/04/2015 12:16    Assessment/Plan: 1 Day Post-Op   Active Problems:   Femur fracture, left   Hypertension   The patient is doing well orthopedically. Her breathing continues to be the main issue. Patient is not showing any signs of respiratory distress however. She will continue on the remains the rebreather mask. The hospitalist service will continue to monitor her status. If her situation declines, she will need transfer back to the ICU. Her chest x-ray from yesterday showed no evidence of fluid overload or other pulmonary pathology. Patient will have her breathing improved and then begin physical therapy once medically appropriate. Patient will begin Lovenox for DVT prophylaxis. She  will continue wearing TED stockings and foot pumps. Patient will complete 24 hours of antibiotics.      Thornton Park , MD 02/05/2015, 2:11 PM

## 2015-02-05 NOTE — Progress Notes (Signed)
Dr sudini called with update . Sat remains 92% on 50% ventimask. md reports attempt to wean to 6liters and if sat goes down will transfer back to stepdown.

## 2015-02-05 NOTE — Evaluation (Signed)
Physical Therapy Evaluation Patient Details Name: Leslie Parker MRN: [id number] DOB: 03/07/1933 Today's Date: 02/05/2015   History of Present Illness  L hip fx, ORIF  Clinical Impression  Pt is very limited this AM with L hip pain that increases with even very minimal activity.  She is on 4.5 liters of O2 and her sats remain in the 80s the entire time, mobility acts deferred this session.  She shows good effort with the 10 minutes of exercises apart from the exam, but ultimately is very limited.     Follow Up Recommendations SNF    Equipment Recommendations   (will likely need a walker after STR)    Recommendations for Other Services       Precautions / Restrictions Precautions Precautions: Fall Restrictions LLE Weight Bearing: Partial weight bearing      Mobility  Bed Mobility Overal bed mobility: Needs Assistance             General bed mobility comments: deferred mobility secondary to pt's severe c/o pain and her O2 was 82-88% during light exercises on 4.5 liters  Transfers                    Ambulation/Gait                Stairs            Wheelchair Mobility    Modified Rankin (Stroke Patients Only)       Balance                                             Pertinent Vitals/Pain Pain Assessment: 0-10 Pain Score: 9     Home Living Family/patient expects to be discharged to:: Inpatient rehab Living Arrangements: Alone               Additional Comments: handicap acessible apartment    Prior Function Level of Independence: Independent               Hand Dominance   Dominant Hand: Right    Extremity/Trunk Assessment   Upper Extremity Assessment: Defer to OT evaluation RUE Deficits / Details: decrease right shoulder ROM ( approximatley 90 degrees of flexion /ABD), s/p TSR several years ago         Lower Extremity Assessment: Generalized weakness (v. limited L hip AROM with signficant pain  increase, 3/5 t/o)         Communication   Communication: No difficulties  Cognition Arousal/Alertness: Awake/alert Behavior During Therapy: WFL for tasks assessed/performed Overall Cognitive Status: Within Functional Limits for tasks assessed                      General Comments      Exercises General Exercises - Lower Extremity Ankle Circles/Pumps: AROM;10 reps Quad Sets: Strengthening;10 reps Gluteal Sets: AAROM;10 reps Heel Slides: AAROM;10 reps Hip ABduction/ADduction: AAROM;10 reps (pt c/o severe pain with this exercise on the L )      Assessment/Plan    PT Assessment Patient needs continued PT services  PT Diagnosis Difficulty walking;Generalized weakness;Acute pain   PT Problem List Decreased strength;Decreased balance;Decreased mobility;Decreased coordination;Decreased cognition;Decreased safety awareness  PT Treatment Interventions Gait training;Therapeutic activities;Therapeutic exercise   PT Goals (Current goals can be found in the Care Plan section) Acute Rehab PT Goals Patient Stated Goal: "I want to feel better"  PT Goal Formulation: With patient Time For Goal Achievement: 02/19/15 Potential to Achieve Goals: Good    Frequency BID   Barriers to discharge        Co-evaluation               End of Session Equipment Utilized During Treatment: Oxygen (4.5) Activity Tolerance: Patient limited by pain Patient left: in bed;with nursing/sitter in room           Time: 1025-1048 PT Time Calculation (min) (ACUTE ONLY): 23 min   Charges:   PT Evaluation $Initial PT Evaluation Tier I: 1 Procedure PT Treatments $Therapeutic Exercise: 8-22 mins   PT G Codes:       Wayne Both, PT, DPT [phone]  Kreg Shropshire 02/05/2015, 12:38 PM

## 2015-02-05 NOTE — Progress Notes (Addendum)
o2 sat 89 percent on 6 liters after svn.rn spoke to dr sudini who wanted to place pt on either ventimask or non rebreather .rn asked md if pt may have pulmonary embolus . md reports not likely. Does not want to transfer back to ccu at this time

## 2015-02-05 NOTE — Evaluation (Signed)
Occupational Therapy Evaluation Patient Details Name: Leslie Parker MRN: [id number] DOB: [birth year] Today's Date: 02/05/2015    History of Present Illness Leslie Parker is a 81 y.o. female with a known history of hypertension, diabetes, asthma presented to the emergency room after a mechanical fall. On the x-ray she has been found to have left proximal femur fracture.    Clinical Impression   Pt demonstrates decrease LB ADL/ self care, functional mobility ,pain in left hip and endurance for ADLs. Pt would benefit from skill OT to increase independence in ADL and assess for A/ E     Follow Up Recommendations  SNF;Supervision/Assistance - 24 hour    Equipment Recommendations  3 in 1 bedside comode;Tub/shower seat    Recommendations for Other Services PT consult;Rehab consult     Precautions / Restrictions Precautions Precautions: Fall Restrictions LLE Weight Bearing: Partial weight bearing      Mobility Bed Mobility Overal bed mobility: Needs Assistance                Transfers                      Balance                                            ADL Overall ADL's : Needs assistance/impaired                                       General ADL Comments: set up to min assist with UB self care /grooming in sitting, needing assistance with LB  - need to further assess LB as treatment progress     Vision Vision Assessment?: No apparent visual deficits   Perception Perception Perception Tested?: No   Praxis      Pertinent Vitals/Pain Pain Assessment:  (no pain sitting in bed. Pt reports strong sharp pains in hip with movement)     Hand Dominance Right   Extremity/Trunk Assessment Upper Extremity Assessment Upper Extremity Assessment: RUE deficits/detail (WFL except right shoulder ) RUE Deficits / Details: decrease right shoulder ROM ( approximately 90 degrees of flexion /ABD), s/p TSR several years ago           Communication Communication Communication: No difficulties   Cognition Arousal/Alertness: Awake/alert Behavior During Therapy: WFL for tasks assessed/performed Overall Cognitive Status: Within Functional Limits for tasks assessed                     General Comments  Pt demonstrates decrease independence in ADLs.    Exercises       Shoulder Instructions      Home Living Family/patient expects to be discharged to:: Inpatient rehab Living Arrangements: Alone                               Additional Comments: handicap acessible apartment      Prior Functioning/Environment Level of Independence: Independent             OT Diagnosis: Generalized weakness   OT Problem List: Decreased activity tolerance;Decreased safety awareness;Decreased knowledge of precautions;Decreased knowledge of use of DME or AE;Pain   OT Treatment/Interventions: Self-care/ADL training;Therapeutic exercise;Energy conservation;DME and/or AE instruction;Patient/family education;Therapeutic activities  OT Goals(Current goals can be found in the care plan section) Acute Rehab OT Goals Patient Stated Goal: to increase functional status, mobility OT Goal Formulation: With patient Time For Goal Achievement: 02-18-2015 Potential to Achieve Goals: Good  OT Frequency: Min 5X/week   Barriers to D/C:            Co-evaluation              End of Session    Activity Tolerance: Patient limited by pain (Pt c/o increase LE pain with any movement of LE) Patient left: with call bell/phone within reach;with family/visitor present   Time: 2979-8921 OT Time Calculation (min): 20 min Charges:  OT Evaluation $Initial OT Evaluation Tier I: 1 Procedure G-Codes:    Leslie Parker W 18-Feb-2015, 10:38 AM

## 2015-02-05 NOTE — Progress Notes (Addendum)
[facility] [place] NAME: Leslie Parker    MR#:  [id number]  DATE OF BIRTH:  01/13/1933  SUBJECTIVE:  CHIEF COMPLAINT:   Chief Complaint  Patient presents with  . Fall  . Dislocation    left hip   24 f with Asthma, HTN here for femur fracture after mechanical fall. Surgery 7/15. Hypoxic after surgery and moved to ICU. Toady breathing better. On 4 L O2. Pain in hip.  Review of Systems  Constitutional: Positive for malaise/fatigue. Negative for fever, chills and weight loss.  HENT: Negative for congestion.   Eyes: Negative for blurred vision and double vision.  Respiratory: Positive for cough, shortness of breath and wheezing. Negative for sputum production.   Cardiovascular: Negative for chest pain, palpitations, orthopnea, leg swelling and PND.  Gastrointestinal: Negative for nausea, vomiting, abdominal pain, diarrhea, constipation and blood in stool.  Genitourinary: Negative for dysuria, urgency, frequency and hematuria.  Musculoskeletal: Positive for back pain and joint pain. Negative for falls.  Neurological: Negative for dizziness, tremors, focal weakness and headaches.  Endo/Heme/Allergies: Does not bruise/bleed easily.  Psychiatric/Behavioral: Negative for depression. The patient does not have insomnia.     VITAL SIGNS: Blood pressure 132/69, pulse 87, temperature 98.3 F (36.8 C), temperature source Oral, resp. rate 14, height 5\' 9"  (1.753 m), weight 74.844 kg (165 lb), SpO2 94 %.  PHYSICAL EXAMINATION:   GENERAL:  81 y.o.-year-old patient lying in the bed with no acute distress. Uncomfortable due to left hip pain EYES: Pupils equal, round, reactive to light and accommodation. No scleral icterus. Extraocular muscles intact.  HEENT: Head atraumatic, normocephalic. Oropharynx and nasopharynx clear.  NECK:  Supple, no jugular venous distention. No thyroid enlargement, no tenderness.  LUNGS: Normal breath sounds  bilaterally, no rales,rhonchi or crepitation. No use of accessory muscles of respiration. Mild expiratory wheezing. CARDIOVASCULAR: S1, S2 normal. No murmurs, rubs, or gallops.  ABDOMEN: Soft, nontender, nondistended. Bowel sounds present. No organomegaly or mass.  EXTREMITIES: No pedal edema, cyanosis, or clubbing.  NEUROLOGIC: Cranial nerves II through XII are intact. Muscle strength 5/5 in all extremities. Sensation intact. Gait not checked.  PSYCHIATRIC: The patient is alert and oriented x 3. Left hip dressing Skin- no rash or ulcers  ORDERS/RESULTS REVIEWED:   CBC  Recent Labs Lab 02/03/15 1237 02/04/15 0603 02/05/15 0639  WBC 7.6 5.6 7.1  HGB 12.6 11.5* 9.8*  HCT 37.1 33.9* 29.0*  PLT 187 167 147*  MCV 87.8 88.2 88.3  MCH 29.9 30.0 29.8  MCHC 34.0 34.0 33.8  RDW 13.9 14.1 14.2  LYMPHSABS 1.2  --   --   MONOABS 0.4  --   --   EOSABS 0.3  --   --   BASOSABS 0.0  --   --    ------------------------------------------------------------------------------------------------------------------  Chemistries   Recent Labs Lab 02/03/15 1237 02/04/15 0603 02/05/15 0639  NA 135 137 134*  K 3.9 3.8 3.9  CL 103 104 104  CO2 23 24 22   GLUCOSE 183* 136* 139*  BUN 23* 19 22*  CREATININE 0.90 0.94 1.21*  CALCIUM 8.7* 8.6* 8.0*   ------------------------------------------------------------------------------------------------------------------ estimated creatinine clearance is 38.8 mL/min (by C-G formula based on Cr of 1.21). ------------------------------------------------------------------------------------------------------------------ No results for input(s): TSH, T4TOTAL, T3FREE, THYROIDAB in the last 72 hours.  Invalid input(s): FREET3  Cardiac Enzymes No results for input(s): CKMB, TROPONINI, MYOGLOBIN in the last 168 hours.  Invalid input(s): CK ------------------------------------------------------------------------------------------------------------------ Invalid  input(s): POCBNP ---------------------------------------------------------------------------------------------------------------  RADIOLOGY: Dg Chest  Port 1 View  02/04/2015   CLINICAL DATA:  Decreased oxygen levels and difficulty breathing following recent left femur ORIF  EXAM: PORTABLE CHEST - 1 VIEW  COMPARISON:  02/03/2015  FINDINGS: Cardiac shadow is within normal limits. Postsurgical changes are again seen in the right shoulder. The lungs are well aerated. No pneumothorax or sizable infiltrate is seen. No bony abnormality is noted.  IMPRESSION: No acute abnormality seen.   Electronically Signed   By: Inez Catalina M.D.   On: 02/04/2015 14:16   Dg Chest Portable 1 View  02/03/2015   CLINICAL DATA:  Left hip fracture secondary to a fall this morning. Pre operative respiratory exam.  EXAM: PORTABLE CHEST - 1 VIEW  COMPARISON:  The 07/13/2014  FINDINGS: The heart size and pulmonary vascularity are normal. Calcification in the thoracic aorta. Minimal scarring at the lung bases. Large hiatal hernia. Emphysema. No acute abnormalities.  IMPRESSION: No acute abnormality. Emphysema. Aortic atherosclerosis. Osteopenia.   Electronically Signed   By: Lorriane Shire M.D.   On: 02/03/2015 13:25   Dg C-arm 61-120 Min  02/04/2015   CLINICAL DATA:  ORIF left femur  EXAM: LEFT FEMUR 1 VIEW; DG C-ARM 61-120 MIN  FLUOROSCOPY TIME:  2 minutes 42 seconds  COMPARISON:  None.  FINDINGS: Intraoperative fluoroscopic radiographs during ORIF of a left femur fracture.  Status post left knee arthroplasty.  IMPRESSION: Intraoperative fluoroscopic radiographs during ORIF of a left femur fracture.   Electronically Signed   By: Julian Hy M.D.   On: 02/04/2015 12:16   Dg Hip Port Unilat With Pelvis 1v Left  02/04/2015   CLINICAL DATA:  Status post ORIF  EXAM: DG HIP (WITH OR WITHOUT PELVIS) 1V PORT LEFT  COMPARISON:  02/03/2015  FINDINGS: The patient is status post open reduction and internal fixation of the proximal left  femur. There is an intra medullary rod and screw device noted. Gas is noted within the surrounding soft tissues. Fracture fragments are in anatomic alignment.  IMPRESSION: 1. Status post ORIF of proximal femur fracture.   Electronically Signed   By: Kerby Moors M.D.   On: 02/04/2015 14:10   Dg Hip Unilat With Pelvis 2-3 Views Left  02/03/2015   CLINICAL DATA:  Left hip pain secondary to a fall today.  EXAM: DG HIP (WITH OR WITHOUT PELVIS) 2-3V LEFT  COMPARISON:  None.  FINDINGS: There is a comminuted angulated slightly overriding intertrochanteric fracture of the proximal left femur. Diffuse osteopenia. Pelvic bones appear intact.  IMPRESSION: Intertrochanteric fracture of the proximal left femur as described. Osteopenia.   Electronically Signed   By: Lorriane Shire M.D.   On: 02/03/2015 13:19   Dg Femur 1v Left  02/04/2015   CLINICAL DATA:  ORIF left femur  EXAM: LEFT FEMUR 1 VIEW; DG C-ARM 61-120 MIN  FLUOROSCOPY TIME:  2 minutes 42 seconds  COMPARISON:  None.  FINDINGS: Intraoperative fluoroscopic radiographs during ORIF of a left femur fracture.  Status post left knee arthroplasty.  IMPRESSION: Intraoperative fluoroscopic radiographs during ORIF of a left femur fracture.   Electronically Signed   By: Julian Hy M.D.   On: 02/04/2015 12:16    EKG:  Orders placed or performed during the hospital encounter of 02/03/15  . EKG 12-Lead  . EKG 12-Lead    ASSESSMENT AND PLAN:  Active Problems:   Femur fracture, left   Hypertension  * Intertrochanteric left femur fracture.  POD#1 Monitor for acute blood loss anemia. Pain control. Physical therapy. Skilled nursing  facility at discharge. Orthopedics seeing the patient  * Acute respiratory failure due to asthma exacerbation Scheduled nebulizers. Start prednisone. Continue home inhalers. Wean oxygen  * Hypertension. Continue outpatient management. Follow blood pressure readings  * Acute blood loss Anemia.  Transfuse if less than  7. Repeat in a.m.  * Diabetes mellitus type 2. Continue outpatient management as well as sliding scale insulin.  Management plans discussed with the patient, family and they are in agreement.   DRUG ALLERGIES:  Allergies  Allergen Reactions  . Simvastatin     CODE STATUS:     Code Status Orders        Start     Ordered   02/03/15 1752  Full code   Continuous     02/03/15 1751      TOTAL TIME TAKING CARE OF THIS PATIENT: 40  minutes.    Hillary Bow R M.D on 02/05/2015 at 11:14 AM  Between 7am to 6pm - Pager - [phone]  After 6pm go to [email or web address] - password [email or web address] [facility]  [facility]  Office  [phone]  CC: Primary care physician; Javier Glazier

## 2015-02-05 NOTE — Progress Notes (Signed)
Received pt back from floor, alert and oriented x 4, placed on nasal cannula at 4L with humidification, 02 sats 94%

## 2015-02-06 ENCOUNTER — Inpatient Hospital Stay: Payer: Medicare Other

## 2015-02-06 LAB — BASIC METABOLIC PANEL
ANION GAP: 3 — AB (ref 5–15)
BUN: 23 mg/dL — ABNORMAL HIGH (ref 6–20)
CALCIUM: 8 mg/dL — AB (ref 8.9–10.3)
CO2: 23 mmol/L (ref 22–32)
Chloride: 104 mmol/L (ref 101–111)
Creatinine, Ser: 1.01 mg/dL — ABNORMAL HIGH (ref 0.44–1.00)
GFR calc Af Amer: 59 mL/min — ABNORMAL LOW (ref >60)
GFR calc non Af Amer: 51 mL/min — ABNORMAL LOW (ref >60)
Glucose, Bld: 200 mg/dL — ABNORMAL HIGH (ref 65–99)
POTASSIUM: 4.1 mmol/L (ref 3.5–5.1)
SODIUM: 130 mmol/L — AB (ref 135–145)

## 2015-02-06 LAB — CBC
HEMATOCRIT: 26 % — AB (ref 35.0–47.0)
HEMOGLOBIN: 8.9 g/dL — AB (ref 12.0–16.0)
MCH: 30.2 pg (ref 26.0–34.0)
MCHC: 34.1 g/dL (ref 32.0–36.0)
MCV: 88.5 fL (ref 80.0–100.0)
Platelets: 149 10*3/uL — ABNORMAL LOW (ref 150–440)
RBC: 2.94 MIL/uL — ABNORMAL LOW (ref 3.80–5.20)
RDW: 14.1 % (ref 11.5–14.5)
WBC: 7.3 10*3/uL (ref 3.6–11.0)

## 2015-02-06 LAB — GLUCOSE, CAPILLARY
GLUCOSE-CAPILLARY: 234 mg/dL — AB (ref 65–99)
GLUCOSE-CAPILLARY: 248 mg/dL — AB (ref 65–99)
Glucose-Capillary: 166 mg/dL — ABNORMAL HIGH (ref 65–99)
Glucose-Capillary: 192 mg/dL — ABNORMAL HIGH (ref 65–99)
Glucose-Capillary: 194 mg/dL — ABNORMAL HIGH (ref 65–99)
Glucose-Capillary: 251 mg/dL — ABNORMAL HIGH (ref 65–99)
Glucose-Capillary: 281 mg/dL — ABNORMAL HIGH (ref 65–99)

## 2015-02-06 MED ORDER — CEFUROXIME AXETIL 500 MG PO TABS
500.0000 mg | ORAL_TABLET | Freq: Two times a day (BID) | ORAL | Status: DC
  Administered 2015-02-06 – 2015-02-08 (×4): 500 mg via ORAL
  Filled 2015-02-06 (×4): qty 1

## 2015-02-06 MED ORDER — IOHEXOL 350 MG/ML SOLN
75.0000 mL | Freq: Once | INTRAVENOUS | Status: AC | PRN
  Administered 2015-02-06: 75 mL via INTRAVENOUS

## 2015-02-06 NOTE — Progress Notes (Signed)
Date: 02/06/2015,   MRN# [id number] FUMIYE LUBBEN Sep 07, 1932 Code Status:     Code Status Orders        Start     Ordered   02/04/15 1535  Full code   Continuous     02/04/15 1534     Hosp day:@LENGTHOFSTAYDAYS @ Referring MD: @ATDPROV @         AdmissionWeight: 165 lb (74.844 kg)                 CurrentWeight: 181 lb (82.1 kg)  CC: High fio2,  Easily sats   HPI: This is an 81 year old younger looking female. Who fell and fractured her left hip. Status post left hip surgery. She known to have asthma, to see pulmonary at [facility]. Presently  In the ICU, on five liters, sats 90-94%.  She is not wheezing, no chest pain/pleurisy, calf pain, hemoptysis, syncope or ectopy. Chest xray is clear. No cardiac history.      PMHX:   Past Medical History  Diagnosis Date  . Diabetes mellitus without complication   . Hypertension   . Asthma   . Impetigo   . Osteopenia   . Osteoarthritis    Surgical Hx:  Past Surgical History  Procedure Laterality Date  . Replacement total knee bilateral    . Hernia repair    . Femur im nail Left 02/04/2015    Procedure: INTRAMEDULLARY (IM) NAIL FEMORAL;  Surgeon: Thornton Park, MD;  Location: [facility] ORS;  Service: Orthopedics;  Laterality: Left;   Family Hx:  Family History  Problem Relation Age of Onset  . Heart failure Mother   . Hypertension Mother    Social Hx:   History  Substance Use Topics  . Smoking status: Never Smoker   . Smokeless tobacco: Not on file  . Alcohol Use: No   Medication:    Home Medication:  No current outpatient prescriptions on file.  Current Medication: @CURMEDTAB @   Allergies:  Simvastatin  Review of Systems: Gen:  Denies  fever, sweats, chills HEENT: Denies blurred vision, double vision, ear pain, eye pain, hearing loss, nose bleeds, sore throat Cvc:  No dizziness, chest pain or heaviness Resp:  no pleurisy, cough, wheezing, sputum production  Gi: Denies swallowing difficulty, stomach pain, nausea or vomiting,  diarrhea, constipation, bowel incontinence Gu:  Denies bladder incontinence, burning urine Ext:   No Joint pain, stiffness or swelling Skin: No skin rash, easy bruising or bleeding or hives Endoc:  No polyuria, polydipsia , polyphagia or weight change Psych: No depression, insomnia or hallucinations  Other:  All other systems negative  Physical Examination:   VS: BP 149/66 mmHg  Pulse 101  Temp(Src) 98 F (36.7 C) (Oral)  Resp 18  Ht 5\' 9"  (1.753 m)  Wt 181 lb (82.1 kg)  BMI 26.72 kg/m2  SpO2 93%  General Appearance: No distress  Neuro: without focal findings, mental status, speech normal, alert and oriented, cranial nerves 2-12 intact, reflexes normal and symmetric, sensation grossly normal  HEENT: PERRLA, EOM intact, no ptosis, no other lesions noticed, Mallampati: Pulmonary:.No wheezing, No rales  Sputum Production:   Cardiovascular:  Normal S1,S2.  No m/r/g.  Abdominal aorta pulsation normal.    Abdomen:Benign, Soft, non-tender, No masses, hepatosplenomegaly, No lymphadenopathy Endoc: No evident thyromegaly, no signs of acromegaly or Cushing features Skin:   warm, no rashes, no ecchymosis  Extremities: normal, no cyanosis, clubbing, no edema, warm with normal capillary refill. Other findings:   Labs results:   Recent Labs  02/04/15  0603  02/05/15  0639  02/06/15  0503  HGB  11.5*  9.8*  8.9*  HCT  33.9*  29.0*  26.0*  MCV  88.2  88.3  88.5  WBC  5.6  7.1  7.3  BUN  19  22*  23*  CREATININE  0.94  1.21*  1.01*  GLUCOSE  136*  139*  200*  CALCIUM  8.6*  8.0*  8.0*  ,   Rad results:   Dg Chest Port 1 View  02/05/2015   CLINICAL DATA:  Hypoxia, increased shortness of breath, recent surgery  EXAM: PORTABLE CHEST - 1 VIEW  COMPARISON:  02/04/2015  FINDINGS: Lungs are clear.  No pleural effusion or pneumothorax.  The heart is normal in size.  Right shoulder arthroplasty.  IMPRESSION: No evidence of acute cardiopulmonary disease.   Electronically Signed   By: Julian Hy M.D.   On: 02/05/2015 18:49    Assessment and Plan:  Marginal oxygenation,(shunt), clear chest xray. Asthma/copd is more a v/q mismatch, with the history of hip fracture, surgery etc need to r/o pulmonary emboli vs atelectasis.   -continue as is -ct angio -dvt prophylaxis -incentive spiro -wean fio2 as tolerated -further orders per above   I have personally obtained a history, examined the patient, evaluated laboratory and imaging results, formulated the assessment and plan and placed orders.  The Patient requires high complexity decision making for assessment and support, frequent evaluation and titration of therapies, application of advanced monitoring technologies and extensive interpretation of multiple databases.   Devaney Segers,M.D. Pulmonary & Critical care Medicine [facility]

## 2015-02-06 NOTE — Progress Notes (Signed)
[place] [facility] [place] NAME: Leslie Parker    MR#:  [id number]  DATE OF BIRTH:  [birth year]  SUBJECTIVE:  CHIEF COMPLAINT:   Chief Complaint  Patient presents with  . Fall  . Dislocation    left hip   81 f with Asthma, HTN here for femur fracture after mechanical fall. Surgery 7/15. Hypoxic after surgery and moved to ICU. Pain in hip better. Moved to floor but had to be transferred back to ICU due to being on VM.  Review of Systems  Constitutional: Positive for malaise/fatigue. Negative for fever, chills and weight loss.  HENT: Negative for congestion.   Eyes: Negative for blurred vision and double vision.  Respiratory: Positive for cough, shortness of breath and wheezing. Negative for sputum production.   Cardiovascular: Negative for chest pain, palpitations, orthopnea, leg swelling and PND.  Gastrointestinal: Negative for nausea, vomiting, abdominal pain, diarrhea, constipation and blood in stool.  Genitourinary: Negative for dysuria, urgency, frequency and hematuria.  Musculoskeletal: Positive for back pain and joint pain. Negative for falls.  Neurological: Negative for dizziness, tremors, focal weakness and headaches.  Endo/Heme/Allergies: Does not bruise/bleed easily.  Psychiatric/Behavioral: Negative for depression. The patient does not have insomnia.     VITAL SIGNS: Blood pressure 143/73, pulse 98, temperature 97.9 F (36.6 C), temperature source Oral, resp. rate 18, height 5\' 9"  (1.753 m), weight 82.1 kg (181 lb), SpO2 88 %.  PHYSICAL EXAMINATION:   GENERAL:  81 y.o.-year-old patient lying in the bed with no acute distress. Uncomfortable due to left hip pain EYES: Pupils equal, round, reactive to light and accommodation. No scleral icterus. Extraocular muscles intact.  HEENT: Head atraumatic, normocephalic. Oropharynx and nasopharynx clear.  NECK:  Supple, no jugular venous distention. No thyroid enlargement, no  tenderness.  LUNGS: Normal breath sounds bilaterally, no rales,rhonchi or crepitation. No use of accessory muscles of respiration. Mild expiratory wheezing. Decreased air entry bilaterally CARDIOVASCULAR: S1, S2 normal. No murmurs, rubs, or gallops.  ABDOMEN: Soft, nontender, nondistended. Bowel sounds present. No organomegaly or mass.  EXTREMITIES: No pedal edema, cyanosis, or clubbing.  NEUROLOGIC: Cranial nerves II through XII are intact. Muscle strength 5/5 in all extremities. Sensation intact. Gait not checked.  PSYCHIATRIC: The patient is alert and oriented x 3. Left hip dressing Skin- no rash or ulcers  ORDERS/RESULTS REVIEWED:   CBC  Recent Labs Lab 02/03/15 1237 02/04/15 0603 02/05/15 0639 02/06/15 0503  WBC 7.6 5.6 7.1 7.3  HGB 12.6 11.5* 9.8* 8.9*  HCT 37.1 33.9* 29.0* 26.0*  PLT 187 167 147* 149*  MCV 87.8 88.2 88.3 88.5  MCH 29.9 30.0 29.8 30.2  MCHC 34.0 34.0 33.8 34.1  RDW 13.9 14.1 14.2 14.1  LYMPHSABS 1.2  --   --   --   MONOABS 0.4  --   --   --   EOSABS 0.3  --   --   --   BASOSABS 0.0  --   --   --    ------------------------------------------------------------------------------------------------------------------  Chemistries   Recent Labs Lab 02/03/15 1237 02/04/15 0603 02/05/15 0639 02/06/15 0503  NA 135 137 134* 130*  K 3.9 3.8 3.9 4.1  CL 103 104 104 104  CO2 23 24 22 23   GLUCOSE 183* 136* 139* 200*  BUN 23* 19 22* 23*  CREATININE 0.90 0.94 1.21* 1.01*  CALCIUM 8.7* 8.6* 8.0* 8.0*   ------------------------------------------------------------------------------------------------------------------ estimated creatinine clearance is 50.9 mL/min (by C-G formula based on Cr of 1.01). ------------------------------------------------------------------------------------------------------------------  No results for input(s): TSH, T4TOTAL, T3FREE, THYROIDAB in the last 72 hours.  Invalid input(s): FREET3  Cardiac Enzymes No results for  input(s): CKMB, TROPONINI, MYOGLOBIN in the last 168 hours.  Invalid input(s): CK ------------------------------------------------------------------------------------------------------------------ Invalid input(s): POCBNP ---------------------------------------------------------------------------------------------------------------  RADIOLOGY: Dg Chest Port 1 View  02/05/2015   CLINICAL DATA:  Hypoxia, increased shortness of breath, recent surgery  EXAM: PORTABLE CHEST - 1 VIEW  COMPARISON:  02/04/2015  FINDINGS: Lungs are clear.  No pleural effusion or pneumothorax.  The heart is normal in size.  Right shoulder arthroplasty.  IMPRESSION: No evidence of acute cardiopulmonary disease.   Electronically Signed   By: Julian Hy M.D.   On: 02/05/2015 18:49   Dg Chest Port 1 View  02/04/2015   CLINICAL DATA:  Decreased oxygen levels and difficulty breathing following recent left femur ORIF  EXAM: PORTABLE CHEST - 1 VIEW  COMPARISON:  02/03/2015  FINDINGS: Cardiac shadow is within normal limits. Postsurgical changes are again seen in the right shoulder. The lungs are well aerated. No pneumothorax or sizable infiltrate is seen. No bony abnormality is noted.  IMPRESSION: No acute abnormality seen.   Electronically Signed   By: Inez Catalina M.D.   On: 02/04/2015 14:16   Dg C-arm 61-120 Min  02/04/2015   CLINICAL DATA:  ORIF left femur  EXAM: LEFT FEMUR 1 VIEW; DG C-ARM 61-120 MIN  FLUOROSCOPY TIME:  2 minutes 42 seconds  COMPARISON:  None.  FINDINGS: Intraoperative fluoroscopic radiographs during ORIF of a left femur fracture.  Status post left knee arthroplasty.  IMPRESSION: Intraoperative fluoroscopic radiographs during ORIF of a left femur fracture.   Electronically Signed   By: Julian Hy M.D.   On: 02/04/2015 12:16   Dg Hip Port Unilat With Pelvis 1v Left  02/04/2015   CLINICAL DATA:  Status post ORIF  EXAM: DG HIP (WITH OR WITHOUT PELVIS) 1V PORT LEFT  COMPARISON:  02/03/2015  FINDINGS:  The patient is status post open reduction and internal fixation of the proximal left femur. There is an intra medullary rod and screw device noted. Gas is noted within the surrounding soft tissues. Fracture fragments are in anatomic alignment.  IMPRESSION: 1. Status post ORIF of proximal femur fracture.   Electronically Signed   By: Kerby Moors M.D.   On: 02/04/2015 14:10   Dg Femur 1v Left  02/04/2015   CLINICAL DATA:  ORIF left femur  EXAM: LEFT FEMUR 1 VIEW; DG C-ARM 61-120 MIN  FLUOROSCOPY TIME:  2 minutes 42 seconds  COMPARISON:  None.  FINDINGS: Intraoperative fluoroscopic radiographs during ORIF of a left femur fracture.  Status post left knee arthroplasty.  IMPRESSION: Intraoperative fluoroscopic radiographs during ORIF of a left femur fracture.   Electronically Signed   By: Julian Hy M.D.   On: 02/04/2015 12:16    EKG:  Orders placed or performed during the hospital encounter of 02/03/15  . EKG 12-Lead  . EKG 12-Lead    ASSESSMENT AND PLAN:  Active Problems:   Femur fracture, left   Hypertension  * Intertrochanteric left femur fracture.  POD#2 Monitor for acute blood loss anemia. Pain control is good. Physical therapy. Skilled nursing facility at discharge. Orthopedics seeing the patient  * Acute respiratory failure due to asthma exacerbation Scheduled nebulizers. Started on prednisone. Continue home inhalers. Wean oxygen Consulted pulmonary due to high O2 requirement.  * Hypertension. Continue outpatient management. Follow blood pressure readings  * Acute blood loss Anemia.  Transfuse if less than 7.  Repeat in a.m.  * Diabetes mellitus type 2. Continue outpatient management as well as sliding scale insulin.  Management plans discussed with the patient and in agreement.   DRUG ALLERGIES:  Allergies  Allergen Reactions  . Simvastatin     CODE STATUS:     Code Status Orders        Start     Ordered   02/03/15 1752  Full code   Continuous      02/03/15 1751      TOTAL TIME TAKING CARE OF THIS PATIENT: 40  minutes.    Hillary Bow R M.D on 02/06/2015 at 10:53 AM  Between 7am to 6pm - Pager - [phone]  After 6pm go to [email or web address] - password [email or web address] [facility]  [facility]  Office  [phone]  CC: Primary care physician; Javier Glazier

## 2015-02-06 NOTE — Progress Notes (Signed)
Instructed on Flutter valve, pt had good effort and understanding.

## 2015-02-06 NOTE — Progress Notes (Signed)
Physical Therapy Treatment Patient Details Name: Leslie Parker MRN: [id number] DOB: [birth year] Today's Date: 02/06/2015    History of Present Illness L hip fx, ORIF    PT Comments    Pt in CCU bed, on 4 liters O2, high 80s at rest, decreases to low 80s with light activity, and into the 70s with sitting EOB.  Pt is not overly symptomatic and nursing present t/o much of PT monitoring.  She shows good effort and wants to do more, but is not appropriate to really push too much yet.    Follow Up Recommendations  SNF     Equipment Recommendations       Recommendations for Other Services       Precautions / Restrictions Precautions Precautions: Fall Restrictions Weight Bearing Restrictions: Yes LLE Weight Bearing: Partial weight bearing    Mobility  Bed Mobility Overal bed mobility: Needs Assistance Bed Mobility: Supine to Sit;Sit to Supine     Supine to sit: Mod assist Sit to supine: Mod assist   General bed mobility comments: Pt's O2 drops from mid 80s to mid 70s on sitting up, with focused breathing she is able to get it back into the mid 80s before trying to stand  Transfers Overall transfer level: Needs assistance Equipment used: Rolling walker (2 wheeled)                Ambulation/Gait Ambulation/Gait assistance: Min assist;Mod assist Ambulation Distance (Feet): 5 Feet Assistive device: Rolling walker (2 wheeled)       General Gait Details: Pt is able to take a few small side steps at EOB, but with o2 being int he 80s most of the time and dropping with minimal activity we did not try to do more today. No LOBs and she does not have excessive fatigue.   Stairs            Wheelchair Mobility    Modified Rankin (Stroke Patients Only)       Balance                                    Cognition Arousal/Alertness: Awake/alert Behavior During Therapy: WFL for tasks assessed/performed Overall Cognitive Status: Within Functional  Limits for tasks assessed                      Exercises General Exercises - Lower Extremity Ankle Circles/Pumps: AROM;10 reps Quad Sets: Strengthening;10 reps Gluteal Sets: AAROM;10 reps Heel Slides: AAROM;10 reps Hip ABduction/ADduction: AAROM;10 reps    General Comments        Pertinent Vitals/Pain Pain Assessment:  (little pain at rest, increases to 6/10 with exercises)    Home Living                      Prior Function            PT Goals (current goals can now be found in the care plan section) Progress towards PT goals: Progressing toward goals    Frequency  BID    PT Plan Current plan remains appropriate    Co-evaluation             End of Session Equipment Utilized During Treatment: Oxygen (4.5 liters, sats mostly in the mid 80s, does drop to 70s) Activity Tolerance: Patient limited by fatigue Patient left: in bed;with nursing/sitter in room     Time: 7035-0093 PT Time  Calculation (min) (ACUTE ONLY): 28 min  Charges:  $Therapeutic Exercise: 8-22 mins $Therapeutic Activity: 8-22 mins                    G Codes:     Wayne Both, PT, DPT [phone]  Kreg Shropshire 02/06/2015, 1:17 PM

## 2015-02-06 NOTE — Progress Notes (Signed)
Subjective:  Postop day #2 status post intramedullary fixation for left intertrochanteric hip fracture. Patient reports pain as mild.  Patient is sitting upright in her hospital bed in ICU 1. Her family is at the bedside. Patient states that she was able to get out of bed today with therapy. She is currently on a nasal cannula. She states that her breathing is improved. She denies any significant hip pain.  Objective:   VITALS:   Filed Vitals:   02/06/15 0719 02/06/15 0800 02/06/15 0900 02/06/15 1000  BP:  130/62 146/63 143/73  Pulse:  82 87 98  Temp:      TempSrc:      Resp:  14 13 18   Height:      Weight:      SpO2: 93% 93% 89% 88%   Left hip/lower extremity: Patient's left hip honeycomb dressing is clean dry and intact. Incision is visualized and has no active drainage. Staples are in place. Neurovascular intact Sensation intact distally Intact pulses distally Dorsiflexion/Plantar flexion intact Incision: dressing C/D/I No cellulitis present Compartment soft  LABS  Results for orders placed or performed during the hospital encounter of 02/03/15 (from the past 24 hour(s))  Glucose, capillary     Status: Abnormal   Collection Time: 02/05/15  4:06 PM  Result Value Ref Range   Glucose-Capillary 217 (H) 65 - 99 mg/dL   Comment 1 Notify RN   Glucose, capillary     Status: Abnormal   Collection Time: 02/05/15  8:01 PM  Result Value Ref Range   Glucose-Capillary 298 (H) 65 - 99 mg/dL  Glucose, capillary     Status: Abnormal   Collection Time: 02/06/15 12:03 AM  Result Value Ref Range   Glucose-Capillary 248 (H) 65 - 99 mg/dL  Glucose, capillary     Status: Abnormal   Collection Time: 02/06/15  3:50 AM  Result Value Ref Range   Glucose-Capillary 194 (H) 65 - 99 mg/dL  CBC     Status: Abnormal   Collection Time: 02/06/15  5:03 AM  Result Value Ref Range   WBC 7.3 3.6 - 11.0 K/uL   RBC 2.94 (L) 3.80 - 5.20 MIL/uL   Hemoglobin 8.9 (L) 12.0 - 16.0 g/dL   HCT 26.0 (L)  35.0 - 47.0 %   MCV 88.5 80.0 - 100.0 fL   MCH 30.2 26.0 - 34.0 pg   MCHC 34.1 32.0 - 36.0 g/dL   RDW 14.1 11.5 - 14.5 %   Platelets 149 (L) 150 - 440 K/uL  Basic metabolic panel     Status: Abnormal   Collection Time: 02/06/15  5:03 AM  Result Value Ref Range   Sodium 130 (L) 135 - 145 mmol/L   Potassium 4.1 3.5 - 5.1 mmol/L   Chloride 104 101 - 111 mmol/L   CO2 23 22 - 32 mmol/L   Glucose, Bld 200 (H) 65 - 99 mg/dL   BUN 23 (H) 6 - 20 mg/dL   Creatinine, Ser 1.01 (H) 0.44 - 1.00 mg/dL   Calcium 8.0 (L) 8.9 - 10.3 mg/dL   GFR calc non Af Amer 51 (L) >60 mL/min   GFR calc Af Amer 59 (L) >60 mL/min   Anion gap 3 (L) 5 - 15  Glucose, capillary     Status: Abnormal   Collection Time: 02/06/15  7:08 AM  Result Value Ref Range   Glucose-Capillary 166 (H) 65 - 99 mg/dL  Glucose, capillary     Status: Abnormal   Collection Time:  02/06/15 11:02 AM  Result Value Ref Range   Glucose-Capillary 281 (H) 65 - 99 mg/dL    Dg Chest Port 1 View  02/05/2015   CLINICAL DATA:  Hypoxia, increased shortness of breath, recent surgery  EXAM: PORTABLE CHEST - 1 VIEW  COMPARISON:  02/04/2015  FINDINGS: Lungs are clear.  No pleural effusion or pneumothorax.  The heart is normal in size.  Right shoulder arthroplasty.  IMPRESSION: No evidence of acute cardiopulmonary disease.   Electronically Signed   By: Julian Hy M.D.   On: 02/05/2015 18:49   Dg Chest Port 1 View  02/04/2015   CLINICAL DATA:  Decreased oxygen levels and difficulty breathing following recent left femur ORIF  EXAM: PORTABLE CHEST - 1 VIEW  COMPARISON:  02/03/2015  FINDINGS: Cardiac shadow is within normal limits. Postsurgical changes are again seen in the right shoulder. The lungs are well aerated. No pneumothorax or sizable infiltrate is seen. No bony abnormality is noted.  IMPRESSION: No acute abnormality seen.   Electronically Signed   By: Inez Catalina M.D.   On: 02/04/2015 14:16   Dg Hip Port Unilat With Pelvis 1v  Left  02/04/2015   CLINICAL DATA:  Status post ORIF  EXAM: DG HIP (WITH OR WITHOUT PELVIS) 1V PORT LEFT  COMPARISON:  02/03/2015  FINDINGS: The patient is status post open reduction and internal fixation of the proximal left femur. There is an intra medullary rod and screw device noted. Gas is noted within the surrounding soft tissues. Fracture fragments are in anatomic alignment.  IMPRESSION: 1. Status post ORIF of proximal femur fracture.   Electronically Signed   By: Kerby Moors M.D.   On: 02/04/2015 14:10    Assessment/Plan: 2 Days Post-Op   Active Problems:   Femur fracture, left   Hypertension  A pulmonary consult has been ordered. Clinically the patient is improving in regards to her breathing. Blood glucose remained elevated. Continue insulin sliding scale. Patient on Lovenox for DVT prophylaxis. Prednisone started.   Thornton Park , MD 02/06/2015, 12:02 PM

## 2015-02-07 LAB — GLUCOSE, CAPILLARY
GLUCOSE-CAPILLARY: 132 mg/dL — AB (ref 65–99)
GLUCOSE-CAPILLARY: 215 mg/dL — AB (ref 65–99)
Glucose-Capillary: 170 mg/dL — ABNORMAL HIGH (ref 65–99)
Glucose-Capillary: 243 mg/dL — ABNORMAL HIGH (ref 65–99)
Glucose-Capillary: 467 mg/dL — ABNORMAL HIGH (ref 65–99)
Glucose-Capillary: 236 mg/dL — ABNORMAL HIGH (ref 65–99)

## 2015-02-07 LAB — CBC
HCT: 26.3 % — ABNORMAL LOW (ref 35.0–47.0)
Hemoglobin: 8.9 g/dL — ABNORMAL LOW (ref 12.0–16.0)
MCH: 30.1 pg (ref 26.0–34.0)
MCHC: 33.9 g/dL (ref 32.0–36.0)
MCV: 88.9 fL (ref 80.0–100.0)
Platelets: 206 10*3/uL (ref 150–440)
RBC: 2.96 MIL/uL — ABNORMAL LOW (ref 3.80–5.20)
RDW: 14.2 % (ref 11.5–14.5)
WBC: 8.7 10*3/uL (ref 3.6–11.0)

## 2015-02-07 MED ORDER — INSULIN ASPART 100 UNIT/ML ~~LOC~~ SOLN
0.0000 [IU] | Freq: Every day | SUBCUTANEOUS | Status: DC
  Administered 2015-02-07: 2 [IU] via SUBCUTANEOUS
  Administered 2015-02-08: 3 [IU] via SUBCUTANEOUS
  Administered 2015-02-09: 2 [IU] via SUBCUTANEOUS
  Filled 2015-02-07 (×2): qty 2
  Filled 2015-02-07 (×2): qty 3
  Filled 2015-02-07: qty 4

## 2015-02-07 MED ORDER — INSULIN ASPART 100 UNIT/ML ~~LOC~~ SOLN
0.0000 [IU] | Freq: Three times a day (TID) | SUBCUTANEOUS | Status: DC
  Administered 2015-02-07: 20 [IU] via SUBCUTANEOUS
  Administered 2015-02-08: 4 [IU] via SUBCUTANEOUS
  Administered 2015-02-08: 11 [IU] via SUBCUTANEOUS
  Administered 2015-02-08: 4 [IU] via SUBCUTANEOUS
  Administered 2015-02-09: 7 [IU] via SUBCUTANEOUS
  Administered 2015-02-09: 3 [IU] via SUBCUTANEOUS
  Administered 2015-02-09: 11 [IU] via SUBCUTANEOUS
  Administered 2015-02-10 (×2): 4 [IU] via SUBCUTANEOUS
  Administered 2015-02-10: 3 [IU] via SUBCUTANEOUS
  Filled 2015-02-07: qty 11
  Filled 2015-02-07: qty 20
  Filled 2015-02-07: qty 7
  Filled 2015-02-07: qty 4
  Filled 2015-02-07: qty 3
  Filled 2015-02-07 (×3): qty 4

## 2015-02-07 NOTE — Progress Notes (Signed)
Physical Therapy Treatment Patient Details Name: Leslie Parker MRN: [id number] DOB: 12/28/1932 Today's Date: 02/07/2015    History of Present Illness L hip fx, ORIF.      PT Comments    Pt readmitted to CCU secondary to declined respiratory status post-op. Nurse discontinued original order and made new order, so re-eval isn't necessary, PT cleared for continued therapy. Concerning bed mobility, pt's O2 drops from mid 80s to high 70s upon sitting up but returned to normal when returned to supine. With focused breathing, she is able to return to the mid 80s before finishing therex. With SLR pt O2 dropped to 80%, PT cued pt to relax and then complete SLR with slowed and controlled breathing, returned to 91%. Pt displays 4/5 minimum general strength with exception to L knee flex/extension at -3/5, but couldn't transfer to standing secondary to dropping O2 sats. Pt would benefit from skilled PT in order to improve cardiorespiratory status/educate and assist with transfers/increase L knee strength.       Follow Up Recommendations  SNF     Equipment Recommendations       Recommendations for Other Services       Precautions / Restrictions Precautions Precautions: Fall;Knee Restrictions Weight Bearing Restrictions: Yes LLE Weight Bearing: Partial weight bearing    Mobility  Bed Mobility Overal bed mobility: Needs Assistance Bed Mobility: Supine to Sit;Sit to Supine     Supine to sit: Mod assist Sit to supine: Mod assist   General bed mobility comments: Pt's O2 drops from mid 80s upon sitting up, with focused breathing she is able to get it back into the mid 80s before trying to stand  Transfers                 General transfer comment: Didn't attempt transfers secondary to O2 dropping to 80% with bed mobility.   Ambulation/Gait                 Stairs            Wheelchair Mobility    Modified Rankin (Stroke Patients Only)       Balance                                     Cognition Arousal/Alertness: Awake/alert Behavior During Therapy: WFL for tasks assessed/performed Overall Cognitive Status: Within Functional Limits for tasks assessed                      Exercises Other Exercises Other Exercises: Supine bilat shoulder flex/shoulder flex-horizontal abduction-adduction progression/SAQ/ankle pumps, 1 x 10 Other Exercises: Supine bilat AAROM heel slides/hip abduction/SLR, 1 x 10, O2 dropped to 80% with SLR, verbal cuing to take deep breaths. Pt presented with 90 deg AA knee flex and 50 deg AA hip flex, -3/5 MMT knee flex/ext.     General Comments        Pertinent Vitals/Pain Pain Assessment: 0-10 Pain Score: 3  Pain Location: L knee pain reported at 3/10 with movement but 0/10 resting.  Pain Descriptors / Indicators: Discomfort;Aching;Sore Pain Intervention(s): Limited activity within patient's tolerance;Monitored during session    Home Living                      Prior Function            PT Goals (current goals can now be found in the care plan  section) Acute Rehab PT Goals Patient Stated Goal: I want to get out of bed PT Goal Formulation: With patient Time For Goal Achievement: 02/19/15 Potential to Achieve Goals: Good Progress towards PT goals: Progressing toward goals    Frequency  BID    PT Plan Current plan remains appropriate    Co-evaluation             End of Session Equipment Utilized During Treatment: Oxygen Activity Tolerance: Patient limited by fatigue;Patient limited by pain Patient left: in bed;with call bell/phone within reach;with bed alarm set     Time: 1128-1201 PT Time Calculation (min) (ACUTE ONLY): 33 min  Charges:                       G Codes:      Bernestine Amass, SPT 2015/02/18 12:25 PM

## 2015-02-07 NOTE — Progress Notes (Signed)
Up with PT earlier in shift but desated down to 78%, otherwise tolerated PT well. Dyspnic with exertion. VSS. Denied pain during shift. Dressing to left leg intact with no new drainage. Family visited earlier in shift.  Leslie Parker

## 2015-02-07 NOTE — Progress Notes (Signed)
[facility] NAME: Leslie Parker    MR#:  [id number]  DATE OF BIRTH:  February 08, 1933  SUBJECTIVE:  CHIEF COMPLAINT:   Chief Complaint  Patient presents with  . Fall  . Dislocation    left hip   89 f with Asthma, HTN here for femur fracture after mechanical fall. Surgery 7/15. Hypoxic after surgery and moved to ICU. Pain in hip well controlled Moved to floor but had to be transferred back to ICU due to being on VM. Now she is on 3-4 L O2 and desats at times to 80s.  Review of Systems  Constitutional: Positive for malaise/fatigue. Negative for fever, chills and weight loss.  HENT: Negative for congestion.   Eyes: Negative for blurred vision and double vision.  Respiratory: Positive for cough, shortness of breath and wheezing. Negative for sputum production.   Cardiovascular: Negative for chest pain, palpitations, orthopnea, leg swelling and PND.  Gastrointestinal: Negative for nausea, vomiting, abdominal pain, diarrhea, constipation and blood in stool.  Genitourinary: Negative for dysuria, urgency, frequency and hematuria.  Musculoskeletal: Positive for back pain and joint pain. Negative for falls.  Neurological: Negative for dizziness, tremors, focal weakness and headaches.  Endo/Heme/Allergies: Does not bruise/bleed easily.  Psychiatric/Behavioral: Negative for depression. The patient does not have insomnia.     VITAL SIGNS: Blood pressure 116/68, pulse 97, temperature 98.2 F (36.8 C), temperature source Oral, resp. rate 14, height 5\' 9"  (1.753 m), weight 82.1 kg (181 lb), SpO2 91 %.  PHYSICAL EXAMINATION:   GENERAL:  81 y.o.-year-old patient lying in the bed with no acute distress. Uncomfortable due to left hip pain EYES: Pupils equal, round, reactive to light and accommodation. No scleral icterus. Extraocular muscles intact.  HEENT: Head atraumatic, normocephalic. Oropharynx and nasopharynx clear.  NECK:  Supple, no  jugular venous distention. No thyroid enlargement, no tenderness.  LUNGS: Normal breath sounds bilaterally, no rales,rhonchi or crepitation. No use of accessory muscles of respiration.  Decreased air entry bilaterally CARDIOVASCULAR: S1, S2 normal. No murmurs, rubs, or gallops.  ABDOMEN: Soft, nontender, nondistended. Bowel sounds present. No organomegaly or mass.  EXTREMITIES: No pedal edema, cyanosis, or clubbing.  NEUROLOGIC: Cranial nerves II through XII are intact. Muscle strength 5/5 in all extremities. Sensation intact. Gait not checked.  PSYCHIATRIC: The patient is alert and oriented x 3. Left hip dressing Skin- no rash or ulcers  ORDERS/RESULTS REVIEWED:   CBC  Recent Labs Lab 02/03/15 1237 02/04/15 0603 02/05/15 0639 02/06/15 0503 02/07/15 0428  WBC 7.6 5.6 7.1 7.3 8.7  HGB 12.6 11.5* 9.8* 8.9* 8.9*  HCT 37.1 33.9* 29.0* 26.0* 26.3*  PLT 187 167 147* 149* 206  MCV 87.8 88.2 88.3 88.5 88.9  MCH 29.9 30.0 29.8 30.2 30.1  MCHC 34.0 34.0 33.8 34.1 33.9  RDW 13.9 14.1 14.2 14.1 14.2  LYMPHSABS 1.2  --   --   --   --   MONOABS 0.4  --   --   --   --   EOSABS 0.3  --   --   --   --   BASOSABS 0.0  --   --   --   --    ------------------------------------------------------------------------------------------------------------------  Chemistries   Recent Labs Lab 02/03/15 1237 02/04/15 0603 02/05/15 0639 02/06/15 0503  NA 135 137 134* 130*  K 3.9 3.8 3.9 4.1  CL 103 104 104 104  CO2 23 24 22 23   GLUCOSE 183* 136* 139* 200*  BUN  23* 19 22* 23*  CREATININE 0.90 0.94 1.21* 1.01*  CALCIUM 8.7* 8.6* 8.0* 8.0*   ------------------------------------------------------------------------------------------------------------------ estimated creatinine clearance is 50.9 mL/min (by C-G formula based on Cr of 1.01). ------------------------------------------------------------------------------------------------------------------ No results for input(s): TSH, T4TOTAL, T3FREE,  THYROIDAB in the last 72 hours.  Invalid input(s): FREET3  Cardiac Enzymes No results for input(s): CKMB, TROPONINI, MYOGLOBIN in the last 168 hours.  Invalid input(s): CK ------------------------------------------------------------------------------------------------------------------ Invalid input(s): POCBNP ---------------------------------------------------------------------------------------------------------------  RADIOLOGY: Ct Angio Chest Pe W/cm &/or Wo Cm  02/06/2015   CLINICAL DATA:  Fall. Hip fracture. Mildly decreased oxygen saturation.  EXAM: CT ANGIOGRAPHY CHEST WITH CONTRAST  TECHNIQUE: Multidetector CT imaging of the chest was performed using the standard protocol during bolus administration of intravenous contrast. Multiplanar CT image reconstructions and MIPs were obtained to evaluate the vascular anatomy.  CONTRAST:  64mL OMNIPAQUE IOHEXOL 350 MG/ML SOLN  COMPARISON:  None.  FINDINGS: Mediastinum: The heart size appears normal. No pericardial effusion. The low attenuation nodules are noted in the right lobe of thyroid gland. Aortic atherosclerosis. Calcification within the left main coronary artery noted. The trachea is patent and appears midline. Small hiatal hernia. The esophagus is otherwise unremarkable. The main pulmonary artery is patent. No lobar or segmental pulmonary artery filling defects identified to suggest a clinically significant embolus.  Lungs/Pleura: Small pleural effusions are identified bilaterally. Near complete atelectasis of the left lower lobe is identified. There is subsegmental atelectasis involving the right lower lobe. Occlusion of the posterior medial lower lobe airway is noted bilaterally which may reflect mucous plugging. Small nodule in the right upper lobe measures 2 mm, image 25/series 6.  Upper Abdomen: No suspicious liver abnormality. Previous cholecystectomy. Increase caliber of the common bile ducts and intrahepatic ducts noted. This appears  similar to previous study from 2014. Renal cysts are partially visualized. There is a small cystic structure arising from the tail of pancreas which measures 1.3 cm and 3 Hounsfield units. This was not seen on the previous examination.  Musculoskeletal: Mild multi level degenerative disc disease noted. No aggressive lytic or sclerotic bone lesions.  Review of the MIP images confirms the above findings.  IMPRESSION: 1. No evidence for acute pulmonary embolus. 2. Near complete atelectasis of the left lower lobe and subsegmental atelectasis of the right lower lobe. Favor mucous plugging. Underlying endobronchial lesion is less favored but not excluded and follow-up imaging would be advised to ensure resolution after appropriate clinical therapy. 3. Aortic atherosclerosis 4. Coronary artery calcifications including left main coronary artery disease. 5. Right upper lobe pulmonary nodule measures 2 mm. If the patient is at high risk for bronchogenic carcinoma, follow-up chest CT at 1 year is recommended. If the patient is at low risk, no follow-up is needed. This recommendation follows the consensus statement: Guidelines for Management of Small Pulmonary Nodules Detected on CT Scans: A Statement from the [place] as published in Radiology 2005; 237:395-400. 6. Small cystic structure is noted within the tail of pancreas. Favored to represent a benign abnormality. A followup examination in 1 year would be advised to ensure stability. This recommendation follows ACR consensus guidelines: Managing Incidental Findings on Abdominal CT: White Paper of the ACR Incidental Findings Committee. Joellyn Rued Radiol [phone]   Electronically Signed   By: Kerby Moors M.D.   On: 02/06/2015 14:08   Dg Chest Port 1 View  02/05/2015   CLINICAL DATA:  Hypoxia, increased shortness of breath, recent surgery  EXAM: PORTABLE CHEST - 1 VIEW  COMPARISON:  02/04/2015  FINDINGS: Lungs are clear.  No pleural effusion or  pneumothorax.  The heart is normal in size.  Right shoulder arthroplasty.  IMPRESSION: No evidence of acute cardiopulmonary disease.   Electronically Signed   By: Julian Hy M.D.   On: 02/05/2015 18:49    EKG:  Orders placed or performed during the hospital encounter of 02/03/15  . EKG 12-Lead  . EKG 12-Lead    ASSESSMENT AND PLAN:  Active Problems:   Femur fracture, left   Hypertension  * Acute respiratory failure due to asthma exacerbation and left lower lobe collapse Scheduled nebulizers. Started on prednisone. Continue home inhalers.Wean oxygen Consulted pulmonary due to high O2 requirement. Discussed with Dr. Mortimer Fries regarding bronch. Advised to continue PT, IS.  * Intertrochanteric left femur fracture.  POD#3 Monitor for acute blood loss anemia. Pain control is good. Physical therapy. Skilled nursing facility at discharge. Orthopedics seeing the patient  * Hypertension. Continue outpatient management. Follow blood pressure readings  * Acute blood loss Anemia.  Transfuse if less than 7.  * Diabetes mellitus type 2. Continue outpatient management as well as sliding scale insulin.  Management plans discussed with the patient and in agreement.   DRUG ALLERGIES:  Allergies  Allergen Reactions  . Simvastatin     CODE STATUS:     Code Status Orders        Start     Ordered   02/03/15 1752  Full code   Continuous     02/03/15 1751      TOTAL TIME TAKING CARE OF THIS PATIENT: 35  minutes.    Hillary Bow R M.D on 02/07/2015 at 12:34 PM  Between 7am to 6pm - Pager - [phone]  After 6pm go to [email or web address] - password [email or web address] [facility]  [facility]  Office  [phone]  CC: Primary care physician; Javier Glazier

## 2015-02-07 NOTE — Care Management Important Message (Signed)
Important Message  Patient Details  Name: Leslie Parker MRN: [id number] Date of Birth: 17-Mar-1933   Medicare Important Message Given:  Yes-second notification given    Juliann Pulse A Allmond 02/07/2015, 1:43 PM

## 2015-02-07 NOTE — Care Management (Signed)
Met briefly with patient to discuss discharge planning. She is currently on O2 via [place]. She has tolerated her breakfast as she was eating when I entered the room. PT is recommending SNF- daughter would like her to go to [facility] to SNF- unsure of plan. Patient states she has used home health PT in the past but unsure of agency name. RNCM will continue to follow along with CSW.

## 2015-02-07 NOTE — Progress Notes (Signed)
RN notified Dr. Darvin Neighbours that patient's blood glucose is 467 and that lab draw should be done to verify. Dr. Darvin Neighbours stated "you dont need to get a lab draw and change her sliding scale to resistant."

## 2015-02-07 NOTE — Progress Notes (Signed)
Subjective:  Postoperative day #3 status post intramedullary fixation for left intertrochanteric hip fracture with subtrochanteric extension. Patient reports pain as mild.  Patient is seen in the ICU today with her daughter at the bedside. She is sitting upright in bed with a nasal cannula. She is in no acute respiratory distress. Patient is able to talk without desaturation. She was able to participate with physical therapy today.  Objective:   VITALS:   Filed Vitals:   02/07/15 1000 02/07/15 1018 02/07/15 1126 02/07/15 1205  BP: 116/68 116/68  116/68  Pulse: 105   97  Temp:    98.1 F (36.7 C)  TempSrc:    Oral  Resp: 18   14  Height:      Weight:      SpO2: 92%  94% 91%   Left lower extremity: Neurovascular intact Sensation intact distally Intact pulses distally Dorsiflexion/Plantar flexion intact Incision: dressing C/D/I No cellulitis present Compartment soft  LABS  Results for orders placed or performed during the hospital encounter of 02/03/15 (from the past 24 hour(s))  Glucose, capillary     Status: Abnormal   Collection Time: 02/06/15  3:55 PM  Result Value Ref Range   Glucose-Capillary 251 (H) 65 - 99 mg/dL  Glucose, capillary     Status: Abnormal   Collection Time: 02/06/15  7:57 PM  Result Value Ref Range   Glucose-Capillary 234 (H) 65 - 99 mg/dL  Glucose, capillary     Status: Abnormal   Collection Time: 02/06/15 11:54 PM  Result Value Ref Range   Glucose-Capillary 192 (H) 65 - 99 mg/dL  Glucose, capillary     Status: Abnormal   Collection Time: 02/07/15  4:13 AM  Result Value Ref Range   Glucose-Capillary 170 (H) 65 - 99 mg/dL  CBC     Status: Abnormal   Collection Time: 02/07/15  4:28 AM  Result Value Ref Range   WBC 8.7 3.6 - 11.0 K/uL   RBC 2.96 (L) 3.80 - 5.20 MIL/uL   Hemoglobin 8.9 (L) 12.0 - 16.0 g/dL   HCT 26.3 (L) 35.0 - 47.0 %   MCV 88.9 80.0 - 100.0 fL   MCH 30.1 26.0 - 34.0 pg   MCHC 33.9 32.0 - 36.0 g/dL   RDW 14.2 11.5 - 14.5 %    Platelets 206 150 - 440 K/uL  Glucose, capillary     Status: Abnormal   Collection Time: 02/07/15  7:22 AM  Result Value Ref Range   Glucose-Capillary 132 (H) 65 - 99 mg/dL  Glucose, capillary     Status: Abnormal   Collection Time: 02/07/15 11:25 AM  Result Value Ref Range   Glucose-Capillary 243 (H) 65 - 99 mg/dL    Ct Angio Chest Pe W/cm &/or Wo Cm  02/06/2015   CLINICAL DATA:  Fall. Hip fracture. Mildly decreased oxygen saturation.  EXAM: CT ANGIOGRAPHY CHEST WITH CONTRAST  TECHNIQUE: Multidetector CT imaging of the chest was performed using the standard protocol during bolus administration of intravenous contrast. Multiplanar CT image reconstructions and MIPs were obtained to evaluate the vascular anatomy.  CONTRAST:  62mL OMNIPAQUE IOHEXOL 350 MG/ML SOLN  COMPARISON:  None.  FINDINGS: Mediastinum: The heart size appears normal. No pericardial effusion. The low attenuation nodules are noted in the right lobe of thyroid gland. Aortic atherosclerosis. Calcification within the left main coronary artery noted. The trachea is patent and appears midline. Small hiatal hernia. The esophagus is otherwise unremarkable. The main pulmonary artery is patent. No lobar or  segmental pulmonary artery filling defects identified to suggest a clinically significant embolus.  Lungs/Pleura: Small pleural effusions are identified bilaterally. Near complete atelectasis of the left lower lobe is identified. There is subsegmental atelectasis involving the right lower lobe. Occlusion of the posterior medial lower lobe airway is noted bilaterally which may reflect mucous plugging. Small nodule in the right upper lobe measures 2 mm, image 25/series 6.  Upper Abdomen: No suspicious liver abnormality. Previous cholecystectomy. Increase caliber of the common bile ducts and intrahepatic ducts noted. This appears similar to previous study from 2014. Renal cysts are partially visualized. There is a small cystic structure arising from  the tail of pancreas which measures 1.3 cm and 3 Hounsfield units. This was not seen on the previous examination.  Musculoskeletal: Mild multi level degenerative disc disease noted. No aggressive lytic or sclerotic bone lesions.  Review of the MIP images confirms the above findings.  IMPRESSION: 1. No evidence for acute pulmonary embolus. 2. Near complete atelectasis of the left lower lobe and subsegmental atelectasis of the right lower lobe. Favor mucous plugging. Underlying endobronchial lesion is less favored but not excluded and follow-up imaging would be advised to ensure resolution after appropriate clinical therapy. 3. Aortic atherosclerosis 4. Coronary artery calcifications including left main coronary artery disease. 5. Right upper lobe pulmonary nodule measures 2 mm. If the patient is at high risk for bronchogenic carcinoma, follow-up chest CT at 1 year is recommended. If the patient is at low risk, no follow-up is needed. This recommendation follows the consensus statement: Guidelines for Management of Small Pulmonary Nodules Detected on CT Scans: A Statement from the [place] as published in Radiology 2005; 237:395-400. 6. Small cystic structure is noted within the tail of pancreas. Favored to represent a benign abnormality. A followup examination in 1 year would be advised to ensure stability. This recommendation follows ACR consensus guidelines: Managing Incidental Findings on Abdominal CT: White Paper of the ACR Incidental Findings Committee. Joellyn Rued Radiol [phone]   Electronically Signed   By: Kerby Moors M.D.   On: 02/06/2015 14:08   Dg Chest Port 1 View  02/05/2015   CLINICAL DATA:  Hypoxia, increased shortness of breath, recent surgery  EXAM: PORTABLE CHEST - 1 VIEW  COMPARISON:  02/04/2015  FINDINGS: Lungs are clear.  No pleural effusion or pneumothorax.  The heart is normal in size.  Right shoulder arthroplasty.  IMPRESSION: No evidence of acute cardiopulmonary disease.    Electronically Signed   By: Julian Hy M.D.   On: 02/05/2015 18:49    Assessment/Plan: 3 Days Post-Op   Active Problems:   Femur fracture, left   Hypertension  Patient is doing well orthopedically. She should continue physical therapy. She was encouraged to continue incentive spirometry. CT injury was negative for pulmonary embolism. Continue Lovenox for DVT prophylaxis. Appreciate medicine and pulmonary following.    Thornton Park , MD 02/07/2015, 2:15 PM

## 2015-02-07 NOTE — Clinical Social Work Note (Signed)
Clinical Social Work Assessment  Patient Details  Name: Leslie Parker MRN: [id number] Date of Birth: 1932/11/26  Date of referral:  02/07/15               Reason for consult:  Facility Placement                Permission sought to share information with:  Facility Art therapist granted to share information::  Yes, Verbal Permission Granted  Name::        Agency::     Relationship::     Contact Information:     Housing/Transportation Living arrangements for the past 2 months:   (home) Source of Information:  Patient Patient Interpreter Needed:  None Criminal Activity/Legal Involvement Pertinent to Current Situation/Hospitalization:  No - Comment as needed Significant Relationships:  Adult Children, Siblings (grandchildren) Lives with:  Self Do you feel safe going back to the place where you live?  Yes Need for family participation in patient care:  No (Coment)  Care giving concerns:  Patient lives alone   Facilities manager / plan:  CSW spoke with patient this morning in ICU regarding physical therapy recommendations for STR. Patient is very pleasant and hopeful to feeling better soon. Patient states that she lives alone and even though she has oxygen at the hospital, she does not have it at home. Patient states she is independent at home. Patient informed CSW that she is aware that her daughter had mentioned something about her going to [facility] for rehab but patient is telling CSW that she is not going to [facility] and that she has a sister and several grandchildren here and she wishes to go to rehab locally. Patient is agreeable to a local bedsearch. Bedsearch initiated.   Employment status:  Retired Forensic scientist:  Medicare PT Recommendations:  [place] / Referral to community resources:  [place]  Patient/Family's Response to care:  Patient willing to go to rehab and wishes to have bedsearch  completed locally.  Patient/Family's Understanding of and Emotional Response to Diagnosis, Current Treatment, and Prognosis:  Patient verbalizes appreciation for CSW assistance. CSW will continue to follow.  Emotional Assessment Appearance:  Appears younger than stated age Attitude/Demeanor/Rapport:   (pleasant and cooperative) Affect (typically observed):  Accepting, Appropriate, Pleasant Orientation:  Oriented to Self, Oriented to Place, Oriented to  Time, Oriented to Situation Alcohol / Substance use:  Not Applicable Psych involvement (Current and /or in the community):  No (Comment)  Discharge Needs  Concerns to be addressed:  Care Coordination Readmission within the last 30 days:  No Current discharge risk:  None Barriers to Discharge:  No Barriers Identified   Shela Leff, LCSW 02/07/2015, 11:42 AM

## 2015-02-08 LAB — GLUCOSE, CAPILLARY
GLUCOSE-CAPILLARY: 294 mg/dL — AB (ref 65–99)
Glucose-Capillary: 157 mg/dL — ABNORMAL HIGH (ref 65–99)
Glucose-Capillary: 186 mg/dL — ABNORMAL HIGH (ref 65–99)
Glucose-Capillary: 254 mg/dL — ABNORMAL HIGH (ref 65–99)

## 2015-02-08 MED ORDER — POLYETHYLENE GLYCOL 3350 17 G PO PACK
17.0000 g | PACK | Freq: Every day | ORAL | Status: DC
  Administered 2015-02-09 – 2015-02-10 (×2): 17 g via ORAL
  Filled 2015-02-08 (×2): qty 1

## 2015-02-08 MED ORDER — ALBUTEROL SULFATE (2.5 MG/3ML) 0.083% IN NEBU
2.5000 mg | INHALATION_SOLUTION | Freq: Four times a day (QID) | RESPIRATORY_TRACT | Status: DC
  Administered 2015-02-09: 2.5 mg via RESPIRATORY_TRACT
  Filled 2015-02-08: qty 3

## 2015-02-08 MED ORDER — FLEET ENEMA 7-19 GM/118ML RE ENEM: 1.0000 | ENEMA | Freq: Once | RECTAL | Status: AC | PRN

## 2015-02-08 NOTE — Progress Notes (Signed)
Occupational Therapy Treatment Patient Details Name: CATHERIN DOORN MRN: [id number] DOB: June 19, 1933 Today's Date: 02/08/2015    History of present illness 81 year old female who fell and suffered a left hip fracture. She recieved an ORIF repair   OT comments  Patient limited by O2 sats  Follow Up Recommendations  SNF    Equipment Recommendations       Recommendations for Other Services      Precautions / Restrictions Precautions Precautions: Fall;Knee Restrictions LLE Weight Bearing: Partial weight bearing       Mobility Bed Mobility                  Transfers                      Balance                                   ADL  Patient practiced technique to Donned/doffed socks and pants to knees with hand over hand assist to illustrate how to use them.                                               Vision                     Perception     Praxis      Cognition   Behavior During Therapy: WFL for tasks assessed/performed Overall Cognitive Status: Within Functional Limits for tasks assessed                       Extremity/Trunk Assessment               Exercises     Shoulder Instructions       General Comments      Pertinent Vitals/ Pain          Home Living                                          Prior Functioning/Environment              Frequency Min 1X/week     Progress Toward Goals  OT Goals(current goals can now be found in the care plan section)        Plan      Co-evaluation                 End of Session Equipment Utilized During Treatment:  (hip kit)   Activity Tolerance  (limited by O2 sats)   Patient Left     Nurse Communication          Time: 1350-1400 OT Time Calculation (min): 10 min  Charges: OT General Charges $OT Visit: 1 Procedure OT Treatments $Self Care/Home Management : 8-22 mins  Valente David  M 02/08/2015, 2:13 PM

## 2015-02-08 NOTE — Progress Notes (Signed)
Subjective:  Postoperative day 4 status post intramedullary fixation for left intertrochanteric hip fracture Patient reports pain as mild.  Patient's oxygen saturations are improving. She was up to side in bed today with physical therapy without significant pain. Patient has been cleared by the ICU and medical teams for transfer to the [place] floor.  Objective:   VITALS:   Filed Vitals:   02/08/15 0600 02/08/15 0700 02/08/15 0720 02/08/15 0953  BP: 140/90 143/76  157/76  Pulse: 81 84  101  Temp:      TempSrc:      Resp: 15 17  21   Height:      Weight:      SpO2: 100% 97% 91% 94%   Left lower extremity/hip: Neurovascular intact Sensation intact distally Intact pulses distally Dorsiflexion/Plantar flexion intact Incision: scant drainage No cellulitis present Compartment soft  LABS  Results for orders placed or performed during the hospital encounter of 02/03/15 (from the past 24 hour(s))  Glucose, capillary     Status: Abnormal   Collection Time: 02/07/15  4:19 PM  Result Value Ref Range   Glucose-Capillary 467 (H) 65 - 99 mg/dL  Glucose, capillary     Status: Abnormal   Collection Time: 02/07/15  9:17 PM  Result Value Ref Range   Glucose-Capillary 215 (H) 65 - 99 mg/dL  Glucose, capillary     Status: Abnormal   Collection Time: 02/07/15  9:47 PM  Result Value Ref Range   Glucose-Capillary 236 (H) 65 - 99 mg/dL  Glucose, capillary     Status: Abnormal   Collection Time: 02/08/15  7:13 AM  Result Value Ref Range   Glucose-Capillary 157 (H) 65 - 99 mg/dL  Glucose, capillary     Status: Abnormal   Collection Time: 02/08/15 11:35 AM  Result Value Ref Range   Glucose-Capillary 186 (H) 65 - 99 mg/dL    Ct Angio Chest Pe W/cm &/or Wo Cm  02/06/2015   CLINICAL DATA:  Fall. Hip fracture. Mildly decreased oxygen saturation.  EXAM: CT ANGIOGRAPHY CHEST WITH CONTRAST  TECHNIQUE: Multidetector CT imaging of the chest was performed using the standard protocol during bolus  administration of intravenous contrast. Multiplanar CT image reconstructions and MIPs were obtained to evaluate the vascular anatomy.  CONTRAST:  62mL OMNIPAQUE IOHEXOL 350 MG/ML SOLN  COMPARISON:  None.  FINDINGS: Mediastinum: The heart size appears normal. No pericardial effusion. The low attenuation nodules are noted in the right lobe of thyroid gland. Aortic atherosclerosis. Calcification within the left main coronary artery noted. The trachea is patent and appears midline. Small hiatal hernia. The esophagus is otherwise unremarkable. The main pulmonary artery is patent. No lobar or segmental pulmonary artery filling defects identified to suggest a clinically significant embolus.  Lungs/Pleura: Small pleural effusions are identified bilaterally. Near complete atelectasis of the left lower lobe is identified. There is subsegmental atelectasis involving the right lower lobe. Occlusion of the posterior medial lower lobe airway is noted bilaterally which may reflect mucous plugging. Small nodule in the right upper lobe measures 2 mm, image 25/series 6.  Upper Abdomen: No suspicious liver abnormality. Previous cholecystectomy. Increase caliber of the common bile ducts and intrahepatic ducts noted. This appears similar to previous study from 2014. Renal cysts are partially visualized. There is a small cystic structure arising from the tail of pancreas which measures 1.3 cm and 3 Hounsfield units. This was not seen on the previous examination.  Musculoskeletal: Mild multi level degenerative disc disease noted. No aggressive lytic or sclerotic  bone lesions.  Review of the MIP images confirms the above findings.  IMPRESSION: 1. No evidence for acute pulmonary embolus. 2. Near complete atelectasis of the left lower lobe and subsegmental atelectasis of the right lower lobe. Favor mucous plugging. Underlying endobronchial lesion is less favored but not excluded and follow-up imaging would be advised to ensure resolution  after appropriate clinical therapy. 3. Aortic atherosclerosis 4. Coronary artery calcifications including left main coronary artery disease. 5. Right upper lobe pulmonary nodule measures 2 mm. If the patient is at high risk for bronchogenic carcinoma, follow-up chest CT at 1 year is recommended. If the patient is at low risk, no follow-up is needed. This recommendation follows the consensus statement: Guidelines for Management of Small Pulmonary Nodules Detected on CT Scans: A Statement from the [place] as published in Radiology 2005; 237:395-400. 6. Small cystic structure is noted within the tail of pancreas. Favored to represent a benign abnormality. A followup examination in 1 year would be advised to ensure stability. This recommendation follows ACR consensus guidelines: Managing Incidental Findings on Abdominal CT: White Paper of the ACR Incidental Findings Committee. Joellyn Rued Radiol [phone]   Electronically Signed   By: Kerby Moors M.D.   On: 02/06/2015 14:08    Assessment/Plan: 4 Days Post-Op   Active Problems:   Femur fracture, left   Hypertension  Patient will be transferred to the [place] floor. Continue physical occupational therapy. Encourage incentive spirometry. Dressing to be changed on the floor to a new honeycomb dressing. Discharge planning per medical team.    Thornton Park , MD 02/08/2015, 1:26 PM

## 2015-02-08 NOTE — Progress Notes (Signed)
Initial Nutrition Assessment    INTERVENTION:   Coordination of Care: pt without BM since admission; discussed with Staci RN, RN is aware and  is giving pt prn and scheduled meds with plans for possible enema/suppository later today if no results Meals and Snacks: Cater to patient preferences  NUTRITION DIAGNOSIS:   No nutrition diagnosis at this time  GOAL:   Patient will meet greater than or equal to 90% of their needs  MONITOR:    (Energy Intake, Digestive System, Electrolyte/Renal Profile, Glucose Profile, Anthropometrics)  REASON FOR ASSESSMENT:   LOS    ASSESSMENT:    Pt admitted with left femur fracture, POD 3, postop respiratory failure due to asthma exacerbation, currently on 4L [place]  Past Medical History  Diagnosis Date  . Diabetes mellitus without complication   . Hypertension   . Asthma   . Impetigo   . Osteopenia   . Osteoarthritis      Diet Order:  Diet Carb Modified Fluid consistency:: Thin; Room service appropriate?: Yes   Energy Intake:  Recorded po intake 84% of meals on average, appetite good  Electrolyte and Renal Profile:  Recent Labs Lab 02/04/15 0603 02/05/15 0639 02/06/15 0503  BUN 19 22* 23*  CREATININE 0.94 1.21* 1.01*  NA 137 134* 130*  K 3.8 3.9 4.1   Glucose Profile:   Recent Labs  02/07/15 2147 02/08/15 0713 02/08/15 1135  GLUCAP 236* 157* 186*   Protein Profile: No results for input(s): ALBUMIN in the last 168 hours. Nutritional Anemia Profile:  CBC Latest Ref Rng 02/07/2015 02/06/2015 02/05/2015  WBC 3.6 - 11.0 K/uL 8.7 7.3 7.1  Hemoglobin 12.0 - 16.0 g/dL 8.9(L) 8.9(L) 9.8(L)  Hematocrit 35.0 - 47.0 % 26.3(L) 26.0(L) 29.0(L)  Platelets 150 - 440 K/uL 206 149(L) 147(L)    Skin:  Reviewed, no issues  Last BM:  7/13    Meds: dulcolax prn, milk of mag prn, colace, aspart with meals, ferrous sulfate  Height:   Ht Readings from Last 1 Encounters:  02/03/15 5\' 9"  (1.753 m)    Weight:   Wt Readings from Last  1 Encounters:  02/06/15 181 lb (82.1 kg)    Filed Weights   02/03/15 1244 02/06/15 0351  Weight: 165 lb (74.844 kg) 181 lb (82.1 kg)     Wt Readings from Last 10 Encounters:  02/06/15 181 lb (82.1 kg)    BMI:  Body mass index is 26.72 kg/(m^2).  LOW Care Level  Kerman Passey MS, [place], LDN [phone] Pager

## 2015-02-08 NOTE — Clinical Social Work Note (Signed)
CSW followed up with patient regarding bed offers and patient and her sister both prefer [place]. CSW informed Helene Kelp at [facility] of the acceptance of their offer.  Shela Leff MSW,LCSW [phone]

## 2015-02-08 NOTE — Progress Notes (Signed)
Physical Therapy Treatment Patient Details Name: Leslie Parker MRN: [id number] DOB: [birth year] Today's Date: 02/08/2015    History of Present Illness L hip fx, ORIF.      PT Comments    Pt was able to transition to sitting today and complete seated therex with O2 increased to 6 L/min per nurse permission, verbal cuing required for deep breathing until O2 sats returned to low 90s. Pt was provided with HEP to complete once tonight and report how she was doing. Pt will remain QD, but PT gave pt HEP to complete once this evening.  If pt continues to progress without O2 complications, will progress to BID. Pt will benefit from skilled PT to increase cardiovascular endurance/increase L knee strength ROM/decreased L LE pain.   Follow Up Recommendations  SNF     Equipment Recommendations       Recommendations for Other Services       Precautions / Restrictions Precautions Precautions: Fall;Knee Restrictions Weight Bearing Restrictions: Yes LLE Weight Bearing: Partial weight bearing    Mobility  Bed Mobility Overal bed mobility: Needs Assistance Bed Mobility: Supine to Sit;Sit to Supine     Supine to sit: Mod assist Sit to supine: Mod assist   General bed mobility comments: Pt's O2 drops from mid 80s upon sitting up, with focused breathing she is able to get it back into the low 90s and then it stabilized, was able to complete therex in sitting. This was with 6 L/min of O2.  Transfers Overall transfer level: Needs assistance Equipment used: Rolling walker (2 wheeled)             General transfer comment: Didn't attempt transfers secondary to O2 dropping to 80% with bed mobility/gross weakness.   Ambulation/Gait             General Gait Details: Pt didn't attempt ambulation today secondary to O2 dropping with standing and gross weakness.    Stairs            Wheelchair Mobility    Modified Rankin (Stroke Patients Only)       Balance                                    Cognition Arousal/Alertness: Awake/alert Behavior During Therapy: WFL for tasks assessed/performed Overall Cognitive Status: Within Functional Limits for tasks assessed                      Exercises Total Joint Exercises Goniometric ROM: L knee ext at 1 deg and L knee flex at 93 deg.  L hip flex at 69 deg.  Other Exercises Other Exercises: Supine bilat ankle pumps/heel slides, 1 x 10.  Other Exercises: Seated at EOB with min assist bilat knee ext/hip marches/hip abd, 1 x 10, active assistive with L leg with each therex.     General Comments        Pertinent Vitals/Pain Pain Score: 6  (Pt reported 6/10 pain in L calf that disrupted her sleeping, 0/10 pain at resting. ) Pain Location: L knee pain reported at 3/10 with goniometric measures but 0/10 in resting.  Pain Descriptors / Indicators: Sore;Discomfort;Aching Pain Intervention(s): Limited activity within patient's tolerance    Home Living                      Prior Function  PT Goals (current goals can now be found in the care plan section) Acute Rehab PT Goals Patient Stated Goal: I want to get out of bed PT Goal Formulation: With patient Time For Goal Achievement: 02/19/15 Potential to Achieve Goals: Good Progress towards PT goals: Progressing toward goals    Frequency  7X/week (Pt will remain QD, but PT gave pt HEP to complete once this evening.  If pt continues to progress without O2 complications, will progress to BID. )    PT Plan Frequency needs to be updated    Co-evaluation             End of Session Equipment Utilized During Treatment: Oxygen Activity Tolerance: Patient limited by fatigue Patient left: in bed;with call bell/phone within reach;with bed alarm set     Time: 5726-2035 PT Time Calculation (min) (ACUTE ONLY): 29 min  Charges:                       G Codes:      Bernestine Amass, SPT 03-07-15 10:17 AM

## 2015-02-08 NOTE — Progress Notes (Signed)
[facility] [place] NAME: Leslie Parker    MR#:  [id number]  DATE OF BIRTH:  1932/08/11  SUBJECTIVE:  CHIEF COMPLAINT:   Chief Complaint  Patient presents with  . Fall  . Dislocation    left hip   81 f with Asthma, HTN here for femur fracture after mechanical fall. Surgery 7/15. Hypoxic after surgery and moved to ICU. Pain in hip well controlled Moved to floor but had to be transferred back to ICU due to being on VM. SOB better. Sats 89-95 on 3 L O2  Review of Systems  Constitutional: Positive for malaise/fatigue. Negative for fever, chills and weight loss.  HENT: Negative for congestion.   Eyes: Negative for blurred vision and double vision.  Respiratory: Positive for cough, shortness of breath and wheezing. Negative for sputum production.   Cardiovascular: Negative for chest pain, palpitations, orthopnea, leg swelling and PND.  Gastrointestinal: Negative for nausea, vomiting, abdominal pain, diarrhea, constipation and blood in stool.  Genitourinary: Negative for dysuria, urgency, frequency and hematuria.  Musculoskeletal: Positive for back pain and joint pain. Negative for falls.  Neurological: Negative for dizziness, tremors, focal weakness and headaches.  Endo/Heme/Allergies: Does not bruise/bleed easily.  Psychiatric/Behavioral: Negative for depression. The patient does not have insomnia.     VITAL SIGNS: Blood pressure 157/76, pulse 101, temperature 98.4 F (36.9 C), temperature source Oral, resp. rate 21, height 5\' 9"  (1.753 m), weight 82.1 kg (181 lb), SpO2 94 %.  PHYSICAL EXAMINATION:   GENERAL:  81 y.o.-year-old patient lying in the bed with no acute distress. Uncomfortable due to left hip pain EYES: Pupils equal, round, reactive to light and accommodation. No scleral icterus. Extraocular muscles intact.  HEENT: Head atraumatic, normocephalic. Oropharynx and nasopharynx clear.  NECK:  Supple, no jugular venous  distention. No thyroid enlargement, no tenderness.  LUNGS: Normal breath sounds bilaterally, no rales,rhonchi or crepitation. No use of accessory muscles of respiration.  Decreased air entry bilaterally CARDIOVASCULAR: S1, S2 normal. No murmurs, rubs, or gallops.  ABDOMEN: Soft, nontender, nondistended. Bowel sounds present. No organomegaly or mass.  EXTREMITIES: No pedal edema, cyanosis, or clubbing.  NEUROLOGIC: Cranial nerves II through XII are intact. Muscle strength 5/5 in all extremities. Sensation intact. Gait not checked.  PSYCHIATRIC: The patient is alert and oriented x 3. Left hip dressing Skin- no rash or ulcers  ORDERS/RESULTS REVIEWED:   CBC  Recent Labs Lab 02/03/15 1237 02/04/15 0603 02/05/15 0639 02/06/15 0503 02/07/15 0428  WBC 7.6 5.6 7.1 7.3 8.7  HGB 12.6 11.5* 9.8* 8.9* 8.9*  HCT 37.1 33.9* 29.0* 26.0* 26.3*  PLT 187 167 147* 149* 206  MCV 87.8 88.2 88.3 88.5 88.9  MCH 29.9 30.0 29.8 30.2 30.1  MCHC 34.0 34.0 33.8 34.1 33.9  RDW 13.9 14.1 14.2 14.1 14.2  LYMPHSABS 1.2  --   --   --   --   MONOABS 0.4  --   --   --   --   EOSABS 0.3  --   --   --   --   BASOSABS 0.0  --   --   --   --    ------------------------------------------------------------------------------------------------------------------  Chemistries   Recent Labs Lab 02/03/15 1237 02/04/15 0603 02/05/15 0639 02/06/15 0503  NA 135 137 134* 130*  K 3.9 3.8 3.9 4.1  CL 103 104 104 104  CO2 23 24 22 23   GLUCOSE 183* 136* 139* 200*  BUN 23* 19 22* 23*  CREATININE 0.90 0.94 1.21* 1.01*  CALCIUM 8.7* 8.6* 8.0* 8.0*   ------------------------------------------------------------------------------------------------------------------ estimated creatinine clearance is 50.9 mL/min (by C-G formula based on Cr of 1.01). ------------------------------------------------------------------------------------------------------------------ No results for input(s): TSH, T4TOTAL, T3FREE, THYROIDAB in  the last 72 hours.  Invalid input(s): FREET3  Cardiac Enzymes No results for input(s): CKMB, TROPONINI, MYOGLOBIN in the last 168 hours.  Invalid input(s): CK ------------------------------------------------------------------------------------------------------------------ Invalid input(s): POCBNP ---------------------------------------------------------------------------------------------------------------  RADIOLOGY: Ct Angio Chest Pe W/cm &/or Wo Cm  02/06/2015   CLINICAL DATA:  Fall. Hip fracture. Mildly decreased oxygen saturation.  EXAM: CT ANGIOGRAPHY CHEST WITH CONTRAST  TECHNIQUE: Multidetector CT imaging of the chest was performed using the standard protocol during bolus administration of intravenous contrast. Multiplanar CT image reconstructions and MIPs were obtained to evaluate the vascular anatomy.  CONTRAST:  55mL OMNIPAQUE IOHEXOL 350 MG/ML SOLN  COMPARISON:  None.  FINDINGS: Mediastinum: The heart size appears normal. No pericardial effusion. The low attenuation nodules are noted in the right lobe of thyroid gland. Aortic atherosclerosis. Calcification within the left main coronary artery noted. The trachea is patent and appears midline. Small hiatal hernia. The esophagus is otherwise unremarkable. The main pulmonary artery is patent. No lobar or segmental pulmonary artery filling defects identified to suggest a clinically significant embolus.  Lungs/Pleura: Small pleural effusions are identified bilaterally. Near complete atelectasis of the left lower lobe is identified. There is subsegmental atelectasis involving the right lower lobe. Occlusion of the posterior medial lower lobe airway is noted bilaterally which may reflect mucous plugging. Small nodule in the right upper lobe measures 2 mm, image 25/series 6.  Upper Abdomen: No suspicious liver abnormality. Previous cholecystectomy. Increase caliber of the common bile ducts and intrahepatic ducts noted. This appears similar to  previous study from 2014. Renal cysts are partially visualized. There is a small cystic structure arising from the tail of pancreas which measures 1.3 cm and 3 Hounsfield units. This was not seen on the previous examination.  Musculoskeletal: Mild multi level degenerative disc disease noted. No aggressive lytic or sclerotic bone lesions.  Review of the MIP images confirms the above findings.  IMPRESSION: 1. No evidence for acute pulmonary embolus. 2. Near complete atelectasis of the left lower lobe and subsegmental atelectasis of the right lower lobe. Favor mucous plugging. Underlying endobronchial lesion is less favored but not excluded and follow-up imaging would be advised to ensure resolution after appropriate clinical therapy. 3. Aortic atherosclerosis 4. Coronary artery calcifications including left main coronary artery disease. 5. Right upper lobe pulmonary nodule measures 2 mm. If the patient is at high risk for bronchogenic carcinoma, follow-up chest CT at 1 year is recommended. If the patient is at low risk, no follow-up is needed. This recommendation follows the consensus statement: Guidelines for Management of Small Pulmonary Nodules Detected on CT Scans: A Statement from the [place] as published in Radiology 2005; 237:395-400. 6. Small cystic structure is noted within the tail of pancreas. Favored to represent a benign abnormality. A followup examination in 1 year would be advised to ensure stability. This recommendation follows ACR consensus guidelines: Managing Incidental Findings on Abdominal CT: White Paper of the ACR Incidental Findings Committee. Joellyn Rued Radiol [phone]   Electronically Signed   By: Kerby Moors M.D.   On: 02/06/2015 14:08    EKG:  Orders placed or performed during the hospital encounter of 02/03/15  . EKG 12-Lead  . EKG 12-Lead    ASSESSMENT AND PLAN:  Active Problems:   Femur fracture, left  Hypertension  * Acute respiratory failure due to  asthma exacerbation and left lower lobe collapse Scheduled nebulizers. Started on prednisone. Continue home inhalers.Wean oxygen Consulted pulmonary due to high O2 requirement. Discussed with Dr. Mortimer Fries regarding bronch. Advised to continue PT, IS. Transfer to medical floor.  * Intertrochanteric left femur fracture.  POD#4 Pain control is good. Physical therapy. Skilled nursing facility at discharge. Orthopedics seeing the patient  * Hypertension. Continue outpatient management. Follow blood pressure readings  * Acute blood loss Anemia.  Transfuse if less than 7.  * Diabetes mellitus type 2. Continue outpatient management as well as sliding scale insulin. Uncontrolled due to steroids  Management plans discussed with the patient and in agreement.   DRUG ALLERGIES:  Allergies  Allergen Reactions  . Simvastatin     CODE STATUS:     Code Status Orders        Start     Ordered   02/03/15 1752  Full code   Continuous     02/03/15 1751      TOTAL TIME TAKING CARE OF THIS PATIENT: 35  minutes.    Hillary Bow R M.D on 02/08/2015 at 11:55 AM  Between 7am to 6pm - Pager - [phone]  After 6pm go to [email or web address] - password [email or web address] [facility]  [facility]  Office  [phone]  CC: Primary care physician; Javier Glazier

## 2015-02-08 NOTE — Progress Notes (Signed)
Inpatient Diabetes Program Recommendations  AACE/ADA: New Consensus Statement on Inpatient Glycemic Control (2013)  Target Ranges:  Prepandial:   less than 140 mg/dL      Peak postprandial:   less than 180 mg/dL (1-2 hours)      Critically ill patients:  140 - 180 mg/dL   Review of Glycemic control:   Results for Leslie Parker, Leslie Parker (MRN [id number]) as of 02/08/2015 13:17  Ref. Range 02/07/2015 16:19 02/07/2015 21:17 02/07/2015 21:47 02/08/2015 07:13 02/08/2015 11:35  Glucose-Capillary Latest Ref Range: 65-99 mg/dL 467 (H) 215 (H) 236 (H) 157 (H) 186 (H)    Diabetes history: Type 2 diabetes Outpatient Diabetes medications: Metformin 500 mg bid Current orders for Inpatient glycemic control:  Novolog resistant tid with meals and HS Note that patient is also on Prednisone 50 mg daily which is likely increasing post-prandial blood sugars  Please consider adding Novolog 3 units tid with meals to cover post prandial blood sugars while patient is on PO Prednisone.  Thanks, Adah Perl, RN, BC-ADM Inpatient Diabetes Coordinator Pager [phone] (8a-5p)

## 2015-02-08 NOTE — Consult Note (Signed)
CT chest reviewed:b/l atelectasis:recommend Incentive spirometry, aggressive Pt and oxygen support.on minimal oxygen support, no indication for bronchoscopy at this time. Will stop abx at this time, Ok to transfer to gen med floor

## 2015-02-09 LAB — GLUCOSE, CAPILLARY
GLUCOSE-CAPILLARY: 222 mg/dL — AB (ref 65–99)
GLUCOSE-CAPILLARY: 222 mg/dL — AB (ref 65–99)
GLUCOSE-CAPILLARY: 222 mg/dL — AB (ref 65–99)
Glucose-Capillary: 136 mg/dL — ABNORMAL HIGH (ref 65–99)
Glucose-Capillary: 282 mg/dL — ABNORMAL HIGH (ref 65–99)
Glucose-Capillary: 221 mg/dL — ABNORMAL HIGH (ref 65–99)

## 2015-02-09 MED ORDER — IPRATROPIUM-ALBUTEROL 0.5-2.5 (3) MG/3ML IN SOLN: 3.0000 mL | RESPIRATORY_TRACT | Status: DC | PRN

## 2015-02-09 MED ORDER — DOCUSATE SODIUM 100 MG PO CAPS: 100.0000 mg | ORAL_CAPSULE | Freq: Two times a day (BID) | ORAL | Status: DC

## 2015-02-09 MED ORDER — ALBUTEROL SULFATE (2.5 MG/3ML) 0.083% IN NEBU
2.5000 mg | INHALATION_SOLUTION | Freq: Three times a day (TID) | RESPIRATORY_TRACT | Status: DC
  Administered 2015-02-09 – 2015-02-10 (×4): 2.5 mg via RESPIRATORY_TRACT
  Filled 2015-02-09 (×4): qty 3

## 2015-02-09 MED ORDER — POLYETHYLENE GLYCOL 3350 17 G PO PACK: 17.0000 g | PACK | Freq: Every day | ORAL | Status: DC

## 2015-02-09 MED ORDER — FERROUS SULFATE 325 (65 FE) MG PO TABS: 325.0000 mg | ORAL_TABLET | Freq: Two times a day (BID) | ORAL | Status: DC

## 2015-02-09 MED ORDER — ENOXAPARIN SODIUM 40 MG/0.4ML ~~LOC~~ SOLN: 30.0000 mg | SUBCUTANEOUS | Status: DC

## 2015-02-09 MED ORDER — TIOTROPIUM BROMIDE MONOHYDRATE 18 MCG IN CAPS: 18.0000 ug | ORAL_CAPSULE | Freq: Every day | RESPIRATORY_TRACT | Status: DC

## 2015-02-09 MED ORDER — OXYCODONE HCL 5 MG PO TABS: 5.0000 mg | ORAL_TABLET | ORAL | Status: DC | PRN

## 2015-02-09 NOTE — Progress Notes (Signed)
Per MD patient is not medically stable for D/C today. Plan is for patient to D/C to [facility] when stable. [facility] admissions coordinator at [facility] is aware of above. Clinical Social Worker (CSW) will continue to follow and assist as needed.   Blima Rich, [place] [phone]

## 2015-02-09 NOTE — Progress Notes (Signed)
  Subjective:  POD # 5 from the left intertrochanteric hip fracture. Patient reports pain as mild.  Patient wishes more strength in her left lower extremity today. She states that her breathing is improving. She is not to work with physical therapy this morning.  Objective:   VITALS:   Filed Vitals:   02/08/15 2009 02/09/15 0456 02/09/15 0744 02/09/15 0752  BP:  150/74 135/72   Pulse:  83 83   Temp:  97.7 F (36.5 C) 98.3 F (36.8 C)   TempSrc:  Oral Oral   Resp:  20 16   Height:      Weight:      SpO2: 92% 92% 95% 92%   Left lower extremity/hip: Personally changed patient's dressing today. Her incisions are clean dry and intact. Her thigh compartments are soft and compressible. She has tenderness or lower leg edema. TED stockings and foot pumps are in place. She has intact sensation light touch in palpable pedal pulses. She intact motor function with 505 strength in all muscle groups of the left lower leg.   LABS  Results for orders placed or performed during the hospital encounter of 02/03/15 (from the past 24 hour(s))  Glucose, capillary     Status: Abnormal   Collection Time: 02/08/15 11:35 AM  Result Value Ref Range   Glucose-Capillary 186 (H) 65 - 99 mg/dL  Glucose, capillary     Status: Abnormal   Collection Time: 02/08/15  4:49 PM  Result Value Ref Range   Glucose-Capillary 294 (H) 65 - 99 mg/dL   Comment 1 Notify RN   Glucose, capillary     Status: Abnormal   Collection Time: 02/08/15  7:57 PM  Result Value Ref Range   Glucose-Capillary 254 (H) 65 - 99 mg/dL   Comment 1 Notify RN   Glucose, capillary     Status: Abnormal   Collection Time: 02/09/15  7:46 AM  Result Value Ref Range   Glucose-Capillary 136 (H) 65 - 99 mg/dL   Comment 1 Notify RN     No results found.  Assessment/Plan: 5 Days Post-Op   Active Problems:   Femur fracture, left   Hypertension  Patient continues to make progress from an orthopedic standpoint. Her breathing seems to be  improving. Spoken with Dr. Darvin Neighbours from the hospitalist service. He'll continue to monitor her breathing and plan for discharge either later today or tomorrow if the patient remains stable from a restaurant standpoint. Patient is discharged she'll follow up my office prior to 10 days for wound check and staple removal. She will remain partial weightbearing on the left lower extremity until follow-up. She should continue physical occupational therapy at rehabilitation for gait training, lower extremity strengthening and hip range of motion.    Thornton Park , MD 02/09/2015, 10:08 AM

## 2015-02-09 NOTE — Progress Notes (Signed)
Physical Therapy Treatment Patient Details Name: Leslie Parker MRN: [id number] DOB: 1933/01/04 Today's Date: 02/09/2015    History of Present Illness 81 year old female who fell and suffered a left hip fracture. She recieved an ORIF repair    PT Comments    PT notified nurse of situation and, per nurse permission, increased O2 to 6 L/min. With sitting and standing, pt required mod assist with RW, O2 sats dropped in range of 78-82%. With focused breathing she is able to get it back into the high 80s and then it stabilized, was able to complete therex in sitting. Pt was able to transfer from bed to chair for the first time, pt wasn't able to complete transfer on first attempt secondary to O2 sats dropping, but was able to transfer on second attempt. Pt frequency increased to BID secondary to ability to handle therex.  PT would benefit from skilled PT in order to increase cardiovasculat endurance/increase L knee ROM/decrease L knee pain.    Follow Up Recommendations  SNF     Equipment Recommendations  Rolling walker with 5" wheels    Recommendations for Other Services       Precautions / Restrictions Precautions Precautions: Fall;Knee Restrictions Weight Bearing Restrictions: Yes LLE Weight Bearing: Partial weight bearing    Mobility  Bed Mobility Overal bed mobility: Needs Assistance Bed Mobility: Supine to Sit;Sit to Supine     Supine to sit: Mod assist Sit to supine: Mod assist   General bed mobility comments: Pt's O2 dropped to 82 upon sitting up, with focused breathing she is able to get it back into the high 80s and then it stabilized, was able to complete therex in sitting. This was with 6 L/min of O2.  Transfers Overall transfer level: Needs assistance Equipment used: Rolling walker (2 wheeled) Transfers: Sit to/from Stand           General transfer comment: Pt's O2 dropped to 78 upon standing, with focused breathing she is able to get it back into the high 80s  and then it stabilized, was able to complete therex in sitting. This was with 6 L/min of O2. Pt required cuing to lean forward/extend knees and hips with ascending, mod assist with RW.  Ambulation/Gait             General Gait Details: Pt didn't attempt ambulation today secondary to O2 dropping with standing and gross weakness.    Stairs            Wheelchair Mobility    Modified Rankin (Stroke Patients Only)       Balance                                    Cognition Arousal/Alertness: Awake/alert Behavior During Therapy: WFL for tasks assessed/performed Overall Cognitive Status: Within Functional Limits for tasks assessed                      Exercises Other Exercises Other Exercises: Supine bilat ankle pumps/heel slides/hip abd/quad sets, 1 x 10. (10 min)     General Comments        Pertinent Vitals/Pain Pain Assessment: 0-10 Pain Score: 3  Pain Location: L knee, 0/10 resting and 3/10 with therex.  Pain Descriptors / Indicators: Aching;Sore;Discomfort Pain Intervention(s): Monitored during session    Home Living  Prior Function            PT Goals (current goals can now be found in the care plan section) Acute Rehab PT Goals Patient Stated Goal: I want to get out of bed PT Goal Formulation: With patient Time For Goal Achievement: 02/19/15 Potential to Achieve Goals: Good Progress towards PT goals: Progressing toward goals    Frequency  BID (Pt able to progress to BID)    PT Plan Frequency needs to be updated    Co-evaluation             End of Session Equipment Utilized During Treatment: Oxygen;Gait belt Activity Tolerance: Patient limited by fatigue Patient left: with call bell/phone within reach;with chair alarm set;in chair     Time: 3299-2426 PT Time Calculation (min) (ACUTE ONLY): 40 min  Charges:                       G CodesBernestine Amass, SPT 02-10-15 11:55  AM

## 2015-02-09 NOTE — Progress Notes (Signed)
[place] [facility] [place] NAME: Leslie Parker    MR#:  [id number]  DATE OF BIRTH:  Oct 17, 1932  SUBJECTIVE:  CHIEF COMPLAINT:   Chief Complaint  Patient presents with  . Fall  . Dislocation    left hip   81 f with Asthma, HTN here for femur fracture after mechanical fall. Surgery 7/15. Hypoxic after surgery and moved to ICU. Pain in hip well controlled Moved to floor but had to be transferred back to ICU due to being on VM. SOB better.  Sats drop into low 80s on minimal movement.  Review of Systems  Constitutional: Positive for malaise/fatigue. Negative for fever, chills and weight loss.  HENT: Negative for congestion.   Eyes: Negative for blurred vision and double vision.  Respiratory: Positive for cough, shortness of breath and wheezing. Negative for sputum production.   Cardiovascular: Negative for chest pain, palpitations, orthopnea, leg swelling and PND.  Gastrointestinal: Negative for nausea, vomiting, abdominal pain, diarrhea, constipation and blood in stool.  Genitourinary: Negative for dysuria, urgency, frequency and hematuria.  Musculoskeletal: Positive for back pain and joint pain. Negative for falls.  Neurological: Negative for dizziness, tremors, focal weakness and headaches.  Endo/Heme/Allergies: Does not bruise/bleed easily.  Psychiatric/Behavioral: Negative for depression. The patient does not have insomnia.     VITAL SIGNS: Blood pressure 135/72, pulse 91, temperature 98.3 F (36.8 C), temperature source Oral, resp. rate 16, height 5\' 9"  (1.753 m), weight 82.1 kg (181 lb), SpO2 96 %.  PHYSICAL EXAMINATION:   GENERAL:  81 y.o.-year-old patient lying in the bed with no acute distress. Uncomfortable due to left hip pain EYES: Pupils equal, round, reactive to light and accommodation. No scleral icterus. Extraocular muscles intact.  HEENT: Head atraumatic, normocephalic. Oropharynx and nasopharynx clear.  NECK:  Supple,  no jugular venous distention. No thyroid enlargement, no tenderness.  LUNGS: Normal breath sounds bilaterally, no rales,rhonchi or crepitation. No use of accessory muscles of respiration.  Decreased air entry bilaterally CARDIOVASCULAR: S1, S2 normal. No murmurs, rubs, or gallops.  ABDOMEN: Soft, nontender, nondistended. Bowel sounds present. No organomegaly or mass.  EXTREMITIES: No pedal edema, cyanosis, or clubbing.  NEUROLOGIC: Cranial nerves II through XII are intact. Muscle strength 5/5 in all extremities. Sensation intact. Gait not checked.  PSYCHIATRIC: The patient is alert and oriented x 3. Left hip dressing Skin- no rash or ulcers  ORDERS/RESULTS REVIEWED:   CBC  Recent Labs Lab 02/03/15 1237 02/04/15 0603 02/05/15 0639 02/06/15 0503 02/07/15 0428  WBC 7.6 5.6 7.1 7.3 8.7  HGB 12.6 11.5* 9.8* 8.9* 8.9*  HCT 37.1 33.9* 29.0* 26.0* 26.3*  PLT 187 167 147* 149* 206  MCV 87.8 88.2 88.3 88.5 88.9  MCH 29.9 30.0 29.8 30.2 30.1  MCHC 34.0 34.0 33.8 34.1 33.9  RDW 13.9 14.1 14.2 14.1 14.2  LYMPHSABS 1.2  --   --   --   --   MONOABS 0.4  --   --   --   --   EOSABS 0.3  --   --   --   --   BASOSABS 0.0  --   --   --   --    ------------------------------------------------------------------------------------------------------------------  Chemistries   Recent Labs Lab 02/03/15 1237 02/04/15 0603 02/05/15 0639 02/06/15 0503  NA 135 137 134* 130*  K 3.9 3.8 3.9 4.1  CL 103 104 104 104  CO2 23 24 22 23   GLUCOSE 183* 136* 139* 200*  BUN 23* 19  22* 23*  CREATININE 0.90 0.94 1.21* 1.01*  CALCIUM 8.7* 8.6* 8.0* 8.0*   ------------------------------------------------------------------------------------------------------------------ estimated creatinine clearance is 50.9 mL/min (by C-G formula based on Cr of 1.01). ------------------------------------------------------------------------------------------------------------------ No results for input(s): TSH, T4TOTAL,  T3FREE, THYROIDAB in the last 72 hours.  Invalid input(s): FREET3  Cardiac Enzymes No results for input(s): CKMB, TROPONINI, MYOGLOBIN in the last 168 hours.  Invalid input(s): CK ------------------------------------------------------------------------------------------------------------------ Invalid input(s): POCBNP ---------------------------------------------------------------------------------------------------------------  RADIOLOGY: No results found.  EKG:  Orders placed or performed during the hospital encounter of 02/03/15  . EKG 12-Lead  . EKG 12-Lead    ASSESSMENT AND PLAN:  Active Problems:   Femur fracture, left   Hypertension  * Acute respiratory failure due to asthma exacerbation and left lower lobe collapse Scheduled nebulizers. Started on prednisone and finished 5 days. Continue home inhalers.Wean oxygen. Discussed with Dr. Mortimer Fries regarding bronch. Advised to continue PT, IS. Transfered to medical floor.  * Intertrochanteric left femur fracture.  POD#5 Pain control is good. Physical therapy. Skilled nursing facility at discharge. Orthopedics seeing the patient  * Hypertension. Continue outpatient management. Follow blood pressure readings  * Acute blood loss Anemia.  Transfuse if less than 7.  * Diabetes mellitus type 2. Continue outpatient management as well as sliding scale insulin. Uncontrolled due to steroids  Management plans discussed with the patient and in agreement.  Likely d/c in AM to SNF on O2.   DRUG ALLERGIES:  Allergies  Allergen Reactions  . Simvastatin     CODE STATUS:     Code Status Orders        Start     Ordered   02/03/15 1752  Full code   Continuous     02/03/15 1751      TOTAL TIME TAKING CARE OF THIS PATIENT: 35  minutes.    Hillary Bow R M.D on 02/09/2015 at 12:01 PM  Between 7am to 6pm - Pager - [phone]  After 6pm go to [email or web address] - password [email or web address] [facility]  [place] [facility]   Office  [phone]  CC: Primary care physician; Javier Glazier

## 2015-02-09 NOTE — Discharge Instructions (Addendum)
°  DIET:  Cardiac diet  DISCHARGE CONDITION:  Stable  ACTIVITY:  Per Dr. Harden Mo instructions  OXYGEN:  Home Oxygen: Yes.     Oxygen Delivery: 3 liters/min via Patient connected to nasal cannula oxygen  DISCHARGE LOCATION:  nursing home   If you experience worsening of your admission symptoms, develop shortness of breath, life threatening emergency, suicidal or homicidal thoughts you must seek medical attention immediately by calling 911 or calling your MD immediately  if symptoms less severe.  You Must read complete instructions/literature along with all the possible adverse reactions/side effects for all the Medicines you take and that have been prescribed to you. Take any new Medicines after you have completely understood and accpet all the possible adverse reactions/side effects.   Please note  You were cared for by a hospitalist during your hospital stay. If you have any questions about your discharge medications or the care you received while you were in the hospital after you are discharged, you can call the unit and asked to speak with the hospitalist on call if the hospitalist that took care of you is not available. Once you are discharged, your primary care physician will handle any further medical issues. Please note that NO REFILLS for any discharge medications will be authorized once you are discharged, as it is imperative that you return to your primary care physician (or establish a relationship with a primary care physician if you do not have one) for your aftercare needs so that they can reassess your need for medications and monitor your lab values.  INCENTIVE SPIROMETER USE FOR ATELECTASIS  Orthopedic discharge instructions:  Patient will continue physical and occupational therapy at rehabilitation for gait training, lower extremity strengthening and hip range of motion. She will remain 50% partial weightbearing on the left lower extremity until orthopedic follow-up  in 2 weeks. There are no range of motion restrictions to her left hip or lower extremity. Continue TED stockings until follow-up. Patient should receive Lovenox 30 mg subcutaneous twice a day for total of 4 weeks postop for DVT prophylaxis. Patient should have a daily incision checks and dressing should be changed when necessary. Patient should have her heels elevated off the bed to avoid posterior heel ulcers. Encourage incentive spirometry while awake. She'll follow-up with orthopedics in 10-14 days for incision check, staple removal, clinical reevaluation and x-ray. Continue to elevate the leg while in minutes and the apply ice to the surgical site. The incision should be kept clean and dry.

## 2015-02-09 NOTE — Progress Notes (Signed)
Physical Therapy Treatment Patient Details Name: Leslie Parker MRN: [id number] DOB: 11-Jul-1933 Today's Date: 02/09/2015    History of Present Illness 81 year old female who fell and suffered a left hip fracture. She recieved an ORIF repair    PT Comments    Pt reports feeling better and not as tired secondary to getting OOB and seated in recliner chair.  Pt was able to complete more therex without O2s dropping below low 80s, lowest was 82 with this session.  Regarding seat to stand transfer, pt was able to holding standing position on first attempt (required 2 attempts on previous session) with mod assist/CGA using RW, with O2 sats dropping to 85 (dropped to 78 previous visit). Pt was able to complete L knee therex with AAROM without any additional pain. Pt's cardiovascular endurance seems to be improving, but O2 sats still intermittently drop in the low 80s with therex.  Pt will benefit from skilled PT in order to improve cardiovascular endurance/increase L knee strength/ROM.   Follow Up Recommendations        Equipment Recommendations       Recommendations for Other Services       Precautions / Restrictions Precautions Precautions: Fall;Knee Restrictions Weight Bearing Restrictions: Yes LLE Weight Bearing: Partial weight bearing    Mobility  Bed Mobility                  Transfers                    Ambulation/Gait                 Stairs            Wheelchair Mobility    Modified Rankin (Stroke Patients Only)       Balance                                    Cognition                            Exercises Other Exercises Other Exercises: Seated bilat (AAROM on L side) LAQ/hip marches, 1 x 10 with breaks in between each set secondary to O2 sats dropping.  (8 min) Other Exercises: Seated at edge of chair bilat shoulder flexion, 1 x 10 with isometric hip adduction, 1 x 10 withisometric hip abduction with breaks  in between each set secondary to O2 sats dropping. (8 min)  Sit to stand transfer required mod assist with RW.     General Comments        Pertinent Vitals/Pain  L knee 0/10 resting and 5/10 at most with therex.       Home Living                      Prior Function            PT Goals (current goals can now be found in the care plan section)      Frequency       PT Plan      Co-evaluation             End of Session           Time: 5364-6803 PT Time Calculation (min) (ACUTE ONLY): 31 min  Charges:  G Codes:      Bernestine Amass, SPT 02/13/15 3:54 PM

## 2015-02-09 NOTE — Care Management Important Message (Signed)
Important Message  Patient Details  Name: MARKEDA NARVAEZ MRN: [id number] Date of Birth: Dec 02, 1932   Medicare Important Message Given:  Yes-third notification given    Juliann Pulse A Allmond 02/09/2015, 10:36 AM

## 2015-02-09 NOTE — Care Management (Signed)
Patient (per MD) unable to maintain acceptable O2 sats with exertion. Potential discharge tomorrow.

## 2015-02-10 ENCOUNTER — Ambulatory Visit (HOSPITAL_COMMUNITY)
Admission: AD | Admit: 2015-02-10 | Discharge: 2015-02-10 | Disposition: A | Discharge status: Home / Self Care | Payer: Medicare Other | Source: Other Acute Inpatient Hospital | Attending: Internal Medicine | Admitting: Internal Medicine

## 2015-02-10 ENCOUNTER — Inpatient Hospital Stay: Payer: Medicare Other

## 2015-02-10 DIAGNOSIS — X58XXXA Exposure to other specified factors, initial encounter: Secondary | ICD-10-CM | POA: Insufficient documentation

## 2015-02-10 DIAGNOSIS — J4550 Severe persistent asthma, uncomplicated: Secondary | ICD-10-CM

## 2015-02-10 DIAGNOSIS — J9621 Acute and chronic respiratory failure with hypoxia: Secondary | ICD-10-CM

## 2015-02-10 DIAGNOSIS — R0602 Shortness of breath: Secondary | ICD-10-CM | POA: Insufficient documentation

## 2015-02-10 DIAGNOSIS — J9811 Atelectasis: Secondary | ICD-10-CM | POA: Clinically undetermined

## 2015-02-10 DIAGNOSIS — J9601 Acute respiratory failure with hypoxia: Secondary | ICD-10-CM | POA: Diagnosis present

## 2015-02-10 DIAGNOSIS — J9620 Acute and chronic respiratory failure, unspecified whether with hypoxia or hypercapnia: Secondary | ICD-10-CM | POA: Diagnosis present

## 2015-02-10 DIAGNOSIS — S72009A Fracture of unspecified part of neck of unspecified femur, initial encounter for closed fracture: Secondary | ICD-10-CM | POA: Insufficient documentation

## 2015-02-10 DIAGNOSIS — R0902 Hypoxemia: Secondary | ICD-10-CM | POA: Insufficient documentation

## 2015-02-10 DIAGNOSIS — J45909 Unspecified asthma, uncomplicated: Secondary | ICD-10-CM | POA: Diagnosis present

## 2015-02-10 DIAGNOSIS — J441 Chronic obstructive pulmonary disease with (acute) exacerbation: Secondary | ICD-10-CM

## 2015-02-10 LAB — GLUCOSE, CAPILLARY
GLUCOSE-CAPILLARY: 129 mg/dL — AB (ref 65–99)
GLUCOSE-CAPILLARY: 171 mg/dL — AB (ref 65–99)
Glucose-Capillary: 168 mg/dL — ABNORMAL HIGH (ref 65–99)

## 2015-02-10 LAB — CREATININE, SERUM
Creatinine, Ser: 0.97 mg/dL (ref 0.44–1.00)
GFR calc Af Amer: >60 mL/min (ref >60)
GFR calc non Af Amer: 54 mL/min — ABNORMAL LOW (ref >60)

## 2015-02-10 MED ORDER — ALBUTEROL SULFATE (2.5 MG/3ML) 0.083% IN NEBU: 2.5000 mg | INHALATION_SOLUTION | Freq: Four times a day (QID) | RESPIRATORY_TRACT | Status: DC

## 2015-02-10 MED ORDER — BISACODYL 10 MG RE SUPP
10.0000 mg | Freq: Every day | RECTAL | Status: DC | PRN
  Administered 2015-02-10: 10 mg via RECTAL
  Filled 2015-02-10: qty 1

## 2015-02-10 MED ORDER — PSEUDOEPHEDRINE HCL 30 MG PO TABS: 30.0000 mg | ORAL_TABLET | Freq: Three times a day (TID) | ORAL | Status: DC

## 2015-02-10 MED ORDER — GUAIFENESIN-DM 100-10 MG/5ML PO SYRP: 5.0000 mL | ORAL_SOLUTION | Freq: Three times a day (TID) | ORAL | Status: DC

## 2015-02-10 NOTE — Discharge Summary (Signed)
[facility] NAME: Leslie Parker    MR#:  [id number]  DATE OF BIRTH:  [birth year]  DATE OF ADMISSION:  02/03/2015 ADMITTING PHYSICIAN: Thornton Park, MD  DATE OF DISCHARGE: No discharge date for patient encounter.  PRIMARY CARE PHYSICIAN: Javier Glazier, MD    ADMISSION DIAGNOSIS:  Hip fracture, left, closed, initial encounter [S72.002A]  DISCHARGE DIAGNOSIS:  Active Problems:   Femur fracture, left   Hypertension   SECONDARY DIAGNOSIS:   Past Medical History  Diagnosis Date  . Diabetes mellitus without complication   . Hypertension   . Asthma   . Impetigo   . Osteopenia   . Osteoarthritis      ADMITTING HISTORY  Leslie Parker is a 81 y.o. female with a known history of hypertension, diabetes, asthma presented to the emergency room after a mechanical fall. Patient's cell phone slid into her recliner yesterday night. Today morning she was trying to move the recliner slipped and fell down landing on her left hip. Did not hit her head. No loss of consciousness. On the x-ray she has been found to have left proximal femur fracture. No other joint pain. No shortness of breath, chest pain. Has good functional status. Last surgery was 6 years back for abdominal hernia which he tolerated well. No prior anesthesia complications. He had a routine stress test 1 year back which is normal.   HOSPITAL COURSE:   * Acute respiratory failure due to asthma exacerbation and left lower lobe collapse Scheduled nebulizers. Started on prednisone and finished 5 days. Continue home inhalers.Wean oxygen as tolerated for sats >92% Discussed with Dr. Mortimer Fries regarding bronch. Advised to continue PT, IS. Transfered to medical floor. Now stable for discharge.  * Intertrochanteric left femur fracture.  POD#6 Pain control is good. Physical therapy. Skilled nursing facility at discharge. Orthopedics seeing the patient  * Hypertension.  Continue outpatient management. Follow blood pressure readings  * Acute blood loss Anemia.  Hb stable. No transfusion needed  * Diabetes mellitus type 2. Continue outpatient management as well as sliding scale insulin. Uncontrolled due to steroids  Stable for discharge to SNF on O2    CONSULTS OBTAINED:  Treatment Team:  Erby Pian, MD  DRUG ALLERGIES:   Allergies  Allergen Reactions  . Simvastatin     DISCHARGE MEDICATIONS:   Current Discharge Medication List    START taking these medications   Details  docusate sodium (COLACE) 100 MG capsule Take 1 capsule (100 mg total) by mouth 2 (two) times daily. Qty: 10 capsule, Refills: 0    enoxaparin (LOVENOX) 40 MG/0.4ML injection Inject 0.3 mLs (30 mg total) into the skin daily.    ferrous sulfate 325 (65 FE) MG tablet Take 1 tablet (325 mg total) by mouth 2 (two) times daily with a meal. Qty: 180 tablet, Refills: 0    ipratropium-albuterol (DUONEB) 0.5-2.5 (3) MG/3ML SOLN Take 3 mLs by nebulization every 4 (four) hours as needed. Qty: 360 mL, Refills: 0    oxyCODONE (OXY IR/ROXICODONE) 5 MG immediate release tablet Take 1 tablet (5 mg total) by mouth every 4 (four) hours as needed for breakthrough pain ((for MODERATE breakthrough pain)). Qty: 30 tablet, Refills: 0    polyethylene glycol (MIRALAX / GLYCOLAX) packet Take 17 g by mouth daily. Qty: 14 each, Refills: 0    tiotropium (SPIRIVA) 18 MCG inhalation capsule Place 1 capsule (18 mcg total) into inhaler and inhale daily. Qty: 30 capsule, Refills: 0  CONTINUE these medications which have NOT CHANGED   Details  amLODipine (NORVASC) 10 MG tablet Take 10 mg by mouth daily.    cyanocobalamin ([employer] VITAMIN B12) 2000 MCG tablet Take 1 tablet by mouth daily.    Fluticasone-Salmeterol (ADVAIR DISKUS) 500-50 MCG/DOSE AEPB Inhale 1 puff into the lungs 2 (two) times daily.    metFORMIN (GLUCOPHAGE) 500 MG tablet Take 1 tablet by mouth 2 (two) times daily.     Multiple Vitamin (MULTI-VITAMINS) TABS Take 1 tablet by mouth daily.         Today    VITAL SIGNS:  Blood pressure 156/67, pulse 75, temperature 98.3 F (36.8 C), temperature source Oral, resp. rate 16, height 5\' 9"  (1.753 m), weight 82.1 kg (181 lb), SpO2 94 %.  I/O:   Intake/Output Summary (Last 24 hours) at 02/10/15 1010 Last data filed at 02/10/15 0920  Gross per 24 hour  Intake   1440 ml  Output      0 ml  Net   1440 ml    PHYSICAL EXAMINATION:  Physical Exam  GENERAL:  81 y.o.-year-old patient lying in the bed with no acute distress.  LUNGS: Normal breath sounds bilaterally, no wheezing, rales,rhonchi or crepitation. No use of accessory muscles of respiration.  CARDIOVASCULAR: S1, S2 normal. No murmurs, rubs, or gallops.  ABDOMEN: Soft, non-tender, non-distended. Bowel sounds present. No organomegaly or mass.  NEUROLOGIC: Moves all 4 extremities. PSYCHIATRIC: The patient is alert and oriented x 3.  SKIN: No obvious rash, lesion, or ulcer.   Left hip dressing/tender. DATA REVIEW:   CBC  Recent Labs Lab 02/07/15 0428  WBC 8.7  HGB 8.9*  HCT 26.3*  PLT 206    Chemistries   Recent Labs Lab 02/06/15 0503 02/10/15 0649  NA 130*  --   K 4.1  --   CL 104  --   CO2 23  --   GLUCOSE 200*  --   BUN 23*  --   CREATININE 1.01* 0.97  CALCIUM 8.0*  --     Cardiac Enzymes No results for input(s): TROPONINI in the last 168 hours.  Microbiology Results  Results for orders placed or performed during the hospital encounter of 02/03/15  MRSA PCR Screening     Status: None   Collection Time: 02/04/15  4:27 AM  Result Value Ref Range Status   MRSA by PCR NEGATIVE NEGATIVE Final    Comment:        The GeneXpert MRSA Assay (FDA approved for NASAL specimens only), is one component of a comprehensive MRSA colonization surveillance program. It is not intended to diagnose MRSA infection nor to guide or monitor treatment for MRSA infections.      RADIOLOGY:  No results found.    Follow up with PCP in 1 week.  Management plans discussed with the patient, family and they are in agreement.  CODE STATUS:     Code Status Orders        Start     Ordered   02/04/15 1535  Full code   Continuous     02/04/15 1534      TOTAL TIME TAKING CARE OF THIS PATIENT ON DAY OF DISCHARGE: more than 30 minutes.    Hillary Bow R M.D on 02/10/2015 at 10:10 AM  Between 7am to 6pm - Pager - [phone]  After 6pm go to [email or web address] - password [email or web address] [facility]  Office  [phone]  CC: Primary care physician; Javier Glazier, MD

## 2015-02-10 NOTE — Progress Notes (Signed)
Physical Therapy Treatment Patient Details Name: Leslie Parker MRN: [id number] DOB: 08-04-1932 Today's Date: 02/10/2015    History of Present Illness 81 year old female who fell and suffered a left hip fracture. She recieved an ORIF repair    PT Comments    Pt reports feeling much improved today secondary to sitting up in recliner chair. Initially, O2 sat was at 3L/min, but was increased to 6L/min with therex per nurse request.  After session, O2 was lowered back to 3L/min. Pt progressed from mod to min assist with bed mobility and sit to stand transfers using a RW, and O2 sats droppoed to 85% (as opposed to 78%) with these transfers. O2 dropped to 85 with sitting therex and returned to normal in less than a minute. Pt O2 dropped to 82 with standing/walking, but returned to upper 80s/lower 90s within 39min with min assist RW. Pt progreesed L knee ext in sitting from active assistive to active. Pt would benefit from skilled PT in order to increase cardiovascular endurance/knee strangth and ROM.    Follow Up Recommendations  SNF     Equipment Recommendations  Rolling walker with 5" wheels    Recommendations for Other Services       Precautions / Restrictions Restrictions Weight Bearing Restrictions: Yes LLE Weight Bearing: Partial weight bearing    Mobility  Bed Mobility Overal bed mobility: Needs Assistance Bed Mobility: Supine to Sit;Sit to Supine     Supine to sit: Min assist Sit to supine: Min assist   General bed mobility comments: Pt's O2 drops from mid 80s upon sitting up. With focused breathing, she is able to get it back into the low 90s within 10 sec and then stabilized. Was able to complete therex in sitting. This was with 4L/min of O2.  Transfers Overall transfer level: Needs assistance Equipment used: Rolling walker (2 wheeled) Transfers: Sit to/from Stand           General transfer comment: Pt's O2 dropped to 86 upon standing with focused breathing she is able  to return to 90% and then when stabilized, is able to transfer from bed to chair. This was with 4L/min of O2.  Ambulation/Gait Ambulation/Gait assistance: Min guard;Min assist Ambulation Distance (Feet): 5 Feet Assistive device: Rolling walker (2 wheeled) Gait Pattern/deviations: Step-to pattern     General Gait Details: Pt ambulated for 5 steps and then sat in chair.    Stairs            Wheelchair Mobility    Modified Rankin (Stroke Patients Only)       Balance                                    Cognition                            Exercises Other Exercises Other Exercises: Seated bilat (AAROM on L side) LAQ/hip marches, 1 x 10. Other Exercises: Seated at EOB bilat shoulder flexion, 1 x 10 with isometric hip abduction. Other Exercises: Supine bilar ankle pumps/quad sets, 1 x 10    General Comments        Pertinent Vitals/Pain      Home Living                      Prior Function  PT Goals (current goals can now be found in the care plan section)      Frequency  BID    PT Plan Current plan remains appropriate    Co-evaluation             End of Session Equipment Utilized During Treatment: Oxygen;Gait belt Activity Tolerance: Patient tolerated treatment well;Patient limited by pain;Patient limited by fatigue Patient left: with call bell/phone within reach;with chair alarm set;in chair     Time: 1031-1056 PT Time Calculation (min) (ACUTE ONLY): 25 min  Charges:                       G Codes:      Bernestine Amass 02/17/15, 11:42 AM

## 2015-02-10 NOTE — Progress Notes (Signed)
Occupational Therapy Treatment Patient Details Name: Leslie Parker MRN: [id number] DOB: [birth year] Today's Date: 02/10/2015    History of present illness 81 year old female who fell and suffered a left hip fracture. She recieved an ORIF repair. She is having problems with O2 dropping during activity.   OT comments  Patient appears to understand instruction (mentioned below)  Follow Up Recommendations       Equipment Recommendations       Recommendations for Other Services      Precautions / Restrictions Precautions Precautions: Fall Restrictions LLE Weight Bearing: Partial weight bearing       Mobility Bed Mobility              Balance                                   ADL  Patient instructed in energy saving techniques during activities of daily living and reviewed hip kit to possibly reduce shortness of breath during lower body dressing.                                              Vision                     Perception     Praxis      Cognition                             Extremity/Trunk Assessment               Exercises     Shoulder Instructions       General Comments      Pertinent Vitals/ Pain          Home Living                                          Prior Functioning/Environment              Frequency       Progress Toward Goals  OT Goals(current goals can now be found in the care plan section)        Plan      Co-evaluation                 End of Session Equipment Utilized During Treatment:  (Hip kit)   Activity Tolerance Patient tolerated treatment well   Patient Left in bed;with call bell/phone within reach;with bed alarm set   Nurse Communication          Time: 9470-9628 OT Time Calculation (min): 12 min  Charges: OT General Charges $OT Visit: 1 Procedure OT Treatments $Self Care/Home Management : 8-22 mins  Myrene Galas, MS/OTR/L  02/10/2015, 3:47 PM

## 2015-02-10 NOTE — Assessment & Plan Note (Signed)
--  Has baseline respiratory failure, with borderline low O2 sats on room air documented in the past.  --Likely now made worse by recent exacerbation combined with post-op atelectasis.

## 2015-02-10 NOTE — Progress Notes (Signed)
Subjective:  Patient is postoperative day #6 status post intramedullary fixation for left intertrochanteric hip fracture. Patient reports pain as mild.  Patient will postop. She remains a nasal cannula O2 supplementation. Patient denies any chest pain shortness of breath abdominal pain. Her Foley is out and she has avoided urine. She has had to have a bowel movement.  Objective:   VITALS:   Filed Vitals:   02/10/15 1049 02/10/15 1055 02/10/15 1115 02/10/15 1131  BP:      Pulse:    88  Temp:      TempSrc:      Resp:      Height:      Weight:      SpO2: 82% 85% 94% 90%   Left lower extremity/hip: I personally changed patient's dressing today. Honeycomb dressing was applied. She had scant drainage on the dressing had placed yesterday. Her thigh compartments are soft and compressible as her lower leg compartments. She has no calf tenderness. She has intact motor function throughout the left lower extremity intact sensation light touch in palpable pedal pulses. Neurovascular intact Sensation intact distally Intact pulses distally Dorsiflexion/Plantar flexion intact Incision: scant drainage No cellulitis present Compartment soft  LABS  Results for orders placed or performed during the hospital encounter of 02/03/15 (from the past 24 hour(s))  Glucose, capillary     Status: Abnormal   Collection Time: 02/09/15  3:42 PM  Result Value Ref Range   Glucose-Capillary 282 (H) 65 - 99 mg/dL   Comment 1 Notify RN   Glucose, capillary     Status: Abnormal   Collection Time: 02/09/15  9:03 PM  Result Value Ref Range   Glucose-Capillary 221 (H) 65 - 99 mg/dL   Comment 1 Notify RN   Creatinine, serum     Status: Abnormal   Collection Time: 02/10/15  6:49 AM  Result Value Ref Range   Creatinine, Ser 0.97 0.44 - 1.00 mg/dL   GFR calc non Af Amer 54 (L) >60 mL/min   GFR calc Af Amer >60 >60 mL/min  Glucose, capillary     Status: Abnormal   Collection Time: 02/10/15  7:39 AM  Result  Value Ref Range   Glucose-Capillary 129 (H) 65 - 99 mg/dL   Comment 1 Notify RN   Glucose, capillary     Status: Abnormal   Collection Time: 02/10/15 11:27 AM  Result Value Ref Range   Glucose-Capillary 168 (H) 65 - 99 mg/dL   Comment 1 Notify RN     Dg Chest 2 View  02/10/2015   CLINICAL DATA:  Patient with worsening shortness of breath.  EXAM: CHEST  2 VIEW  COMPARISON:  Chest CT 02/06/2015 ; chest radiograph 02/05/2015  FINDINGS: Stable cardiac and mediastinal contours. Persistent atelectatic changes involving the left lower lobe. No pleural effusion or pneumothorax. Mid thoracic spine degenerative changes. Postoperative changes proximal right humerus.  IMPRESSION: Persistent atelectatic changes left lower lobe.   Electronically Signed   By: Lovey Newcomer M.D.   On: 02/10/2015 12:08    Assessment/Plan: 6 Days Post-Op   Active Problems:   Femur fracture, left   Hypertension  Patient [facility] to [facility]. She is doing well orthopedically. She should continue physical and occupational therapy. She'll remain on 50% partial weightbearing restrictions with a walker until her next follow-up with me. She should follow up my office in 10-14 days for wound check, staple removal and x-ray.    Thornton Park , MD 02/10/2015, 2:09 PM

## 2015-02-10 NOTE — Assessment & Plan Note (Signed)
--  Stopped spiriva, started nebs 4 times daily.  --Decongestant, add mucinex/robitussin.

## 2015-02-10 NOTE — Progress Notes (Signed)
Paged Dr. Darvin Neighbours due to patient desaturation while sitting at bedside to 82% even on 3 Liters.  Increased to 6liters and only achieved 85%.  Ordered two view chest xray.

## 2015-02-10 NOTE — Progress Notes (Signed)
Report called to [facility] Almira Bar, RN.  Called report and transportation request to [facility].

## 2015-02-10 NOTE — Progress Notes (Signed)
Discussed with Dr. Juanell Fairly with pulmonary to see patient regarding the left lower lobe collapse.

## 2015-02-10 NOTE — Assessment & Plan Note (Signed)
--  Pt has an outpatient diagnosis of asthma, not sure if this represents COPD.

## 2015-02-10 NOTE — Care Management (Addendum)
Discharge suspended due to high risk for readmission due to de-sat and O2 up to 6 liters with movement. Patient said PT put her O2 on 6 liters while she worked with them today. She is currently resting at 3 liters O2 per [place].  Spoke with patient and her sister regarding LTAC and she states [facility] is too far but would go to [facility]; [place] was too far too. There is no LTAC in [place]. Patient asked to speak with [facility] Liaison Seth Bake regarding LTAC and patient now wishes to go to [facility]. Dr. Darvin Neighbours paged for discharge/transfer. [facility] packet started.

## 2015-02-10 NOTE — Consult Note (Signed)
PULMONARY, CRITICAL CARE CONSULTATION  Asthma, chronic --Pt has an outpatient diagnosis of asthma, not sure if this represents COPD.   COPD exacerbation --Stopped spiriva, started nebs 4 times daily.  --Decongestant, add mucinex/robitussin.   Acute on chronic respiratory failure --Has baseline respiratory failure, with borderline low O2 sats on room air documented in the past.  --Likely now made worse by recent exacerbation combined with post-op atelectasis.   Atelectasis --Subsegmental bibasilar atelectasis R>L. --Continue aggressive pulmonary toilet, doubt that bronchoscopy would be of benefit considering the distal nature of this atelectasis.  --Presumably this is related to post-op state, however needs to have outpatient follow up in 4 to 6 weeks to repeat imaging. If not resolved may need to consider bronchoscopy at that time to r/o enbobronchial lesion.       Date: 02/10/2015  MRN# [id number] Leslie Parker 05-01-1933  Referring Physician: Dr. Levin Erp is a 81 y.o. old female seen in consultation for dyspnea and hypoxia.  CC:  Chief Complaint  Patient presents with  . Fall  . Dislocation    left hip    HPI:  Leslie Parker is a 81 y.o. female with asthma, HTN, DM who is now POD#6 from left IT Femur frx repair sustained after a mechanical fall. She was planned on being discharged today however it was noted that she was persistently hypoxic, she was requiring as much as 3L at rest and 6L with activity. It was noted that on admission her sat was 91% at rest. Review of previous notes shows that she has had poorly controlled asthma over the years with an obstructive defect, she has been on advair 500.  Subsequently it is noted that she was was treated for an asthma exacerbation approximately one month ago with a course of steroid and abx. She tells me that she feels she did not completely recover from that episode and was still having mild dyspnea and bronchial  secretions upon presentation.  Currently she feels that her breathing is improved from a few days ago.      PMHX:   Past Medical History  Diagnosis Date  . Diabetes mellitus without complication   . Hypertension   . Asthma   . Impetigo   . Osteopenia   . Osteoarthritis    Surgical Hx:  Past Surgical History  Procedure Laterality Date  . Replacement total knee bilateral    . Hernia repair    . Femur im nail Left 02/04/2015    Procedure: INTRAMEDULLARY (IM) NAIL FEMORAL;  Surgeon: Thornton Park, MD;  Location: [facility] ORS;  Service: Orthopedics;  Laterality: Left;   Family Hx:  Family History  Problem Relation Age of Onset  . Heart failure Mother   . Hypertension Mother    Social Hx:   History  Substance Use Topics  . Smoking status: Never Smoker   . Smokeless tobacco: Not on file  . Alcohol Use: No   Medication:   Current Outpatient Rx  Name  Route  Sig  Dispense  Refill  . docusate sodium (COLACE) 100 MG capsule   Oral   Take 1 capsule (100 mg total) by mouth 2 (two) times daily.   10 capsule   0   . enoxaparin (LOVENOX) 40 MG/0.4ML injection   Subcutaneous   Inject 0.3 mLs (30 mg total) into the skin daily.           10 days   . ferrous sulfate 325 (65 FE) MG tablet  Oral   Take 1 tablet (325 mg total) by mouth 2 (two) times daily with a meal.   180 tablet   0   . ipratropium-albuterol (DUONEB) 0.5-2.5 (3) MG/3ML SOLN   Nebulization   Take 3 mLs by nebulization every 4 (four) hours as needed.   360 mL   0   . oxyCODONE (OXY IR/ROXICODONE) 5 MG immediate release tablet   Oral   Take 1 tablet (5 mg total) by mouth every 4 (four) hours as needed for breakthrough pain ((for MODERATE breakthrough pain)).   30 tablet   0   . polyethylene glycol (MIRALAX / GLYCOLAX) packet   Oral   Take 17 g by mouth daily.   14 each   0   . tiotropium (SPIRIVA) 18 MCG inhalation capsule   Inhalation   Place 1 capsule (18 mcg total) into inhaler and inhale  daily.   30 capsule   0       Allergies:  Simvastatin  Review of Systems: Gen:  Denies  fever, sweats, chills HEENT: Denies blurred vision, double vision, ear pain. Cvc:  No dizziness, chest pain or heaviness Resp:   Denies, shortness of breath Gi: Denies swallowing difficulty, stomach pain, nausea or vomiting, diarrhea, constipation, bowel incontinence Gu:  Denies bladder incontinence, burning urine Ext:   No Joint pain, stiffness or swelling Skin: No skin rash, easy bruising or bleeding or hives Endoc:  No polyuria, polydipsia , polyphagia or weight change Psych: No depression, insomnia or hallucinations  Other:  All other systems negative  Physical Examination:   VS: BP 156/67 mmHg  Pulse 88  Temp(Src) 98.3 F (36.8 C) (Oral)  Resp 16  Ht 5\' 9"  (1.753 m)  Wt 82.1 kg (181 lb)  BMI 26.72 kg/m2  SpO2 96%  General Appearance: No distress  Neuro:without focal findings, mental status, speech normal, alert and oriented, cranial nerves 2-12 intact, reflexes normal and symmetric, sensation grossly normal  HEENT: PERRLA, EOM intact. Pulmonary: normal breath sounds., diaphragmatic excursion normal.  CardiovascularNormal S1,S2.  No m/r/g.  Abdominal aorta pulsation normal.    Abdomen: Benign, Soft, non-tender, No masses, hepatosplenomegaly, No lymphadenopathy Renal:  No costovertebral tenderness  GU:  No performed at this time. Endoc: No evident thyromegaly, no signs of acromegaly or Cushing features Skin:   warm, no rashes, no ecchymosis  Extremities: normal, no cyanosis, clubbing, no edema, warm with normal capillary refill.    Labs results: reviewed.   Rad results: films and CT report reviewed. Showed bibasilar atelectasis--subsegmental and bilateral, R>L    Thank  you for the consultation and for allowing [facility] to assist in the care of your patient. Our recommendations are noted above.  Please contact us if we can be of further  service.   Marda Stalker, MD [facility] Office Number: [phone]

## 2015-02-10 NOTE — Progress Notes (Signed)
Per RN Case Manager patient's disposition has changed to [place]. Clinical Education officer, museum ([place]) updated Cabin crew at [employer]. Please reconsult if future social work needs arise. CSW signing off.   Blima Rich, [place] [phone]

## 2015-02-10 NOTE — Progress Notes (Addendum)
Patient unable to wean or stay at 3 liters of oxygen while sitting on side of bed.  Had to increase to 6 liters of oxygen.  [place] was the original location but due to increased need for oxygen while sitting on side of bed, discharge location changed to [facility].  Plan to discharge this shift to [facility] with [place].  [facility] representative visited for initial patient information and they stated that they would have another representative call this shift and then once that was completed we could then call for carelink transportation.

## 2015-02-10 NOTE — Assessment & Plan Note (Signed)
--  Subsegmental bibasilar atelectasis R>L. --Continue aggressive pulmonary toilet, doubt that bronchoscopy would be of benefit considering the distal nature of this atelectasis.  --Presumably this is related to post-op state, however needs to have outpatient follow up in 4 to 6 weeks to repeat imaging. If not resolved may need to consider bronchoscopy at that time to r/o enbobronchial lesion.

## 2015-02-10 NOTE — Progress Notes (Signed)
rn spoke with dr sudini . Dr sparks requesting md order a d-dimer.dr sudini declined at this time. Reports pt has already had a ct scan

## 2015-02-16 NOTE — Anesthesia Postprocedure Evaluation (Signed)
  Anesthesia Post-op Note  Patient: Leslie Parker  Procedure(s) Performed: Procedure(s): INTRAMEDULLARY (IM) NAIL FEMORAL (Left)  Anesthesia type:Spinal  Patient location: PACU  Post pain: Pain level controlled  Post assessment: Post-op Vital signs reviewed, Patient's Cardiovascular Status Stable, Respiratory Function Stable, Patent Airway and No signs of Nausea or vomiting  Post vital signs: Reviewed and stable  Last Vitals:  Filed Vitals:   02/10/15 1723  BP: 158/85  Pulse: 105  Temp: 37 C  Resp: 19    Level of consciousness: awake, alert  and patient cooperative  Complications: No apparent anesthesia complications

## 2015-07-24 HISTORY — PX: CATARACT EXTRACTION: SUR2

## 2016-01-05 ENCOUNTER — Other Ambulatory Visit: Payer: Self-pay | Admitting: Orthopedic Surgery

## 2016-01-05 DIAGNOSIS — M7542 Impingement syndrome of left shoulder: Secondary | ICD-10-CM

## 2016-01-26 ENCOUNTER — Ambulatory Visit
Admission: RE | Admit: 2016-01-26 | Discharge: 2016-01-26 | Disposition: A | Discharge status: Home / Self Care | Payer: Medicare Other | Source: Ambulatory Visit | Attending: Orthopedic Surgery | Admitting: Orthopedic Surgery

## 2016-01-26 DIAGNOSIS — M19012 Primary osteoarthritis, left shoulder: Secondary | ICD-10-CM | POA: Insufficient documentation

## 2016-01-26 DIAGNOSIS — M7542 Impingement syndrome of left shoulder: Secondary | ICD-10-CM

## 2016-01-26 DIAGNOSIS — M25412 Effusion, left shoulder: Secondary | ICD-10-CM | POA: Diagnosis not present

## 2016-02-01 ENCOUNTER — Encounter: Payer: Self-pay | Admitting: *Deleted

## 2016-02-03 NOTE — Discharge Instructions (Signed)
Cataract Surgery, Care After °Refer to this sheet in the next few weeks. These instructions provide you with information on caring for yourself after your procedure. Your caregiver may also give you more specific instructions. Your treatment has been planned according to current medical practices, but problems sometimes occur. Call your caregiver if you have any problems or questions after your procedure.  °HOME CARE INSTRUCTIONS  °· Avoid strenuous activities as directed by your caregiver. °· Ask your caregiver when you can resume driving. °· Use eyedrops or other medicines to help healing and control pressure inside your eye as directed by your caregiver. °· Only take over-the-counter or prescription medicines for pain, discomfort, or fever as directed by your caregiver. °· Do not to touch or rub your eyes. °· You may be instructed to use a protective shield during the first few days and nights after surgery. If not, wear sunglasses to protect your eyes. This is to protect the eye from pressure or from being accidentally bumped. °· Keep the area around your eye clean and dry. Avoid swimming or allowing water to hit you directly in the face while showering. Keep soap and shampoo out of your eyes. °· Do not bend or lift heavy objects. Bending increases pressure in the eye. You can walk, climb stairs, and do light household chores. °· Do not put a contact lens into the eye that had surgery until your caregiver says it is okay to do so. °· Ask your doctor when you can return to work. This will depend on the kind of work that you do. If you work in a dusty environment, you may be advised to wear protective eyewear for a period of time. °· Ask your caregiver when it will be safe to engage in sexual activity. °· Continue with your regular eye exams as directed by your caregiver. °What to expect: °· It is normal to feel itching and mild discomfort for a few days after cataract surgery. Some fluid discharge is also common,  and your eye may be sensitive to light and touch. °· After 1 to 2 days, even moderate discomfort should disappear. In most cases, healing will take about 6 weeks. °· If you received an intraocular lens (IOL), you may notice that colors are very bright or have a blue tinge. Also, if you have been in bright sunlight, everything may appear reddish for a few hours. If you see these color tinges, it is because your lens is clear and no longer cloudy. Within a few months after receiving an IOL, these extra colors should go away. When you have healed, you will probably need new glasses. °SEEK MEDICAL CARE IF:  °· You have increased bruising around your eye. °· You have discomfort not helped by medicine. °SEEK IMMEDIATE MEDICAL CARE IF:  °· You have a  fever. °· You have a worsening or sudden vision loss. °· You have redness, swelling, or increasing pain in the eye. °· You have a thick discharge from the eye that had surgery. °MAKE SURE YOU: °· Understand these instructions. °· Will watch your condition. °· Will get help right away if you are not doing well or get worse. °  °This information is not intended to replace advice given to you by your health care provider. Make sure you discuss any questions you have with your health care provider. °  °Document Released: 01/26/2005 Document Revised: 07/30/2014 Document Reviewed: 03/02/2011 °Elsevier Interactive Patient Education ©2016 Elsevier Inc. ° °General Anesthesia, Adult, Care After °Refer to   this sheet in the next few weeks. These instructions provide you with information on caring for yourself after your procedure. Your health care provider may also give you more specific instructions. Your treatment has been planned according to current medical practices, but problems sometimes occur. Call your health care provider if you have any problems or questions after your procedure. °WHAT TO EXPECT AFTER THE PROCEDURE °After the procedure, it is typical to  experience: °· Sleepiness. °· Nausea and vomiting. °HOME CARE INSTRUCTIONS °· For the first 24 hours after general anesthesia: °¨ Have a responsible person with you. °¨ Do not drive a car. If you are alone, do not take public transportation. °¨ Do not drink alcohol. °¨ Do not take medicine that has not been prescribed by your health care provider. °¨ Do not sign important papers or make important decisions. °¨ You may resume a normal diet and activities as directed by your health care provider. °· Change bandages (dressings) as directed. °· If you have questions or problems that seem related to general anesthesia, call the hospital and ask for the anesthetist or anesthesiologist on call. °SEEK MEDICAL CARE IF: °· You have nausea and vomiting that continue the day after anesthesia. °· You develop a rash. °SEEK IMMEDIATE MEDICAL CARE IF:  °· You have difficulty breathing. °· You have chest pain. °· You have any allergic problems. °  °This information is not intended to replace advice given to you by your health care provider. Make sure you discuss any questions you have with your health care provider. °  °Document Released: 10/15/2000 Document Revised: 07/30/2014 Document Reviewed: 11/07/2011 °Elsevier Interactive Patient Education ©2016 Elsevier Inc. ° °

## 2016-02-06 ENCOUNTER — Ambulatory Visit
Admission: RE | Admit: 2016-02-06 | Discharge: 2016-02-06 | Disposition: A | Discharge status: Home / Self Care | Payer: Medicare Other | Source: Ambulatory Visit | Attending: Ophthalmology | Admitting: Ophthalmology

## 2016-02-06 ENCOUNTER — Ambulatory Visit: Payer: Medicare Other | Admitting: Anesthesiology

## 2016-02-06 ENCOUNTER — Encounter
Admission: RE | Disposition: A | Discharge status: Home / Self Care | Payer: Self-pay | Source: Ambulatory Visit | Attending: Ophthalmology

## 2016-02-06 DIAGNOSIS — Z79899 Other long term (current) drug therapy: Secondary | ICD-10-CM | POA: Diagnosis not present

## 2016-02-06 DIAGNOSIS — Z9889 Other specified postprocedural states: Secondary | ICD-10-CM | POA: Diagnosis not present

## 2016-02-06 DIAGNOSIS — J449 Chronic obstructive pulmonary disease, unspecified: Secondary | ICD-10-CM | POA: Insufficient documentation

## 2016-02-06 DIAGNOSIS — Z9049 Acquired absence of other specified parts of digestive tract: Secondary | ICD-10-CM | POA: Diagnosis not present

## 2016-02-06 DIAGNOSIS — I10 Essential (primary) hypertension: Secondary | ICD-10-CM | POA: Diagnosis not present

## 2016-02-06 DIAGNOSIS — Z7951 Long term (current) use of inhaled steroids: Secondary | ICD-10-CM | POA: Insufficient documentation

## 2016-02-06 DIAGNOSIS — Z7952 Long term (current) use of systemic steroids: Secondary | ICD-10-CM | POA: Insufficient documentation

## 2016-02-06 DIAGNOSIS — Z90710 Acquired absence of both cervix and uterus: Secondary | ICD-10-CM | POA: Insufficient documentation

## 2016-02-06 DIAGNOSIS — Z96653 Presence of artificial knee joint, bilateral: Secondary | ICD-10-CM | POA: Diagnosis not present

## 2016-02-06 DIAGNOSIS — Z7984 Long term (current) use of oral hypoglycemic drugs: Secondary | ICD-10-CM | POA: Diagnosis not present

## 2016-02-06 DIAGNOSIS — Z9841 Cataract extraction status, right eye: Secondary | ICD-10-CM | POA: Insufficient documentation

## 2016-02-06 DIAGNOSIS — E1136 Type 2 diabetes mellitus with diabetic cataract: Secondary | ICD-10-CM | POA: Insufficient documentation

## 2016-02-06 DIAGNOSIS — H2512 Age-related nuclear cataract, left eye: Secondary | ICD-10-CM | POA: Insufficient documentation

## 2016-02-06 DIAGNOSIS — H269 Unspecified cataract: Secondary | ICD-10-CM | POA: Diagnosis present

## 2016-02-06 HISTORY — PX: CATARACT EXTRACTION W/PHACO: SHX586

## 2016-02-06 HISTORY — DX: Cardiac murmur, unspecified: R01.1

## 2016-02-06 HISTORY — DX: Presence of dental prosthetic device (complete) (partial): Z97.2

## 2016-02-06 LAB — GLUCOSE, CAPILLARY
Glucose-Capillary: 176 mg/dL — ABNORMAL HIGH (ref 65–99)
Glucose-Capillary: 147 mg/dL — ABNORMAL HIGH (ref 65–99)

## 2016-02-06 SURGERY — PHACOEMULSIFICATION, CATARACT, WITH IOL INSERTION
Anesthesia: Monitor Anesthesia Care | Site: Eye | Laterality: Left | Wound class: Clean

## 2016-02-06 MED ORDER — POVIDONE-IODINE 5 % OP SOLN
1.0000 "application " | OPHTHALMIC | Status: DC | PRN
  Administered 2016-02-06: 1 via OPHTHALMIC

## 2016-02-06 MED ORDER — TETRACAINE HCL 0.5 % OP SOLN
1.0000 [drp] | OPHTHALMIC | Status: DC | PRN
  Administered 2016-02-06: 1 [drp] via OPHTHALMIC

## 2016-02-06 MED ORDER — LIDOCAINE HCL (PF) 4 % IJ SOLN
INTRAMUSCULAR | Status: DC | PRN
  Administered 2016-02-06: 1 mL via OPHTHALMIC

## 2016-02-06 MED ORDER — FENTANYL CITRATE (PF) 100 MCG/2ML IJ SOLN
INTRAMUSCULAR | Status: DC | PRN
  Administered 2016-02-06: 50 ug via INTRAVENOUS

## 2016-02-06 MED ORDER — CEFUROXIME OPHTHALMIC INJECTION 1 MG/0.1 ML
INJECTION | OPHTHALMIC | Status: DC | PRN
  Administered 2016-02-06: 0.1 mL via INTRACAMERAL

## 2016-02-06 MED ORDER — LACTATED RINGERS IV SOLN: INTRAVENOUS | Status: DC

## 2016-02-06 MED ORDER — EPINEPHRINE HCL 1 MG/ML IJ SOLN
INTRAOCULAR | Status: DC | PRN
  Administered 2016-02-06: 185 mL via OPHTHALMIC

## 2016-02-06 MED ORDER — TIMOLOL MALEATE 0.5 % OP SOLN
OPHTHALMIC | Status: DC | PRN
  Administered 2016-02-06: 1 [drp] via OPHTHALMIC

## 2016-02-06 MED ORDER — MIDAZOLAM HCL 2 MG/2ML IJ SOLN
INTRAMUSCULAR | Status: DC | PRN
  Administered 2016-02-06: 2 mg via INTRAVENOUS

## 2016-02-06 MED ORDER — NA HYALUR & NA CHOND-NA HYALUR 0.4-0.35 ML IO KIT
PACK | INTRAOCULAR | Status: DC | PRN
  Administered 2016-02-06: 1 mL via INTRAOCULAR

## 2016-02-06 MED ORDER — BRIMONIDINE TARTRATE 0.2 % OP SOLN
OPHTHALMIC | Status: DC | PRN
  Administered 2016-02-06: 1 [drp] via OPHTHALMIC

## 2016-02-06 MED ORDER — ARMC OPHTHALMIC DILATING GEL
1.0000 "application " | OPHTHALMIC | Status: DC | PRN
  Administered 2016-02-06 (×2): 1 via OPHTHALMIC

## 2016-02-06 SURGICAL SUPPLY — 28 items
APPLICATOR COTTON TIP 3IN (MISCELLANEOUS) ×3 IMPLANT
CANNULA ANT/CHMB 27GA (MISCELLANEOUS) ×3 IMPLANT
DISSECTOR HYDRO NUCLEUS 50X22 (MISCELLANEOUS) ×3 IMPLANT
GLOVE BIO SURGEON STRL SZ7 (GLOVE) ×3 IMPLANT
GLOVE SURG LX 6.5 MICRO (GLOVE) ×2
GLOVE SURG LX STRL 6.5 MICRO (GLOVE) ×1 IMPLANT
GOWN STRL REUS W/ TWL LRG LVL3 (GOWN DISPOSABLE) ×2 IMPLANT
GOWN STRL REUS W/TWL LRG LVL3 (GOWN DISPOSABLE) ×4
LENS IOL ACRYSOF IQ 20.0 (Intraocular Lens) ×3 IMPLANT
MARKER SKIN DUAL TIP RULER LAB (MISCELLANEOUS) ×3 IMPLANT
NEEDLE FILTER BLUNT 18X 1/2SAF (NEEDLE) ×2
NEEDLE FILTER BLUNT 18X1 1/2 (NEEDLE) ×1 IMPLANT
PACK CATARACT BRASINGTON (MISCELLANEOUS) ×3 IMPLANT
PACK EYE AFTER SURG (MISCELLANEOUS) ×3 IMPLANT
PACK OPTHALMIC (MISCELLANEOUS) ×3 IMPLANT
RING MALYGIN 7.0 (MISCELLANEOUS) IMPLANT
SOL BAL SALT 15ML (MISCELLANEOUS)
SOLUTION BAL SALT 15ML (MISCELLANEOUS) IMPLANT
SUT ETHILON 10-0 CS-B-6CS-B-6 (SUTURE)
SUT VICRYL  9 0 (SUTURE)
SUT VICRYL 9 0 (SUTURE) IMPLANT
SUTURE EHLN 10-0 CS-B-6CS-B-6 (SUTURE) IMPLANT
SYR 3ML LL SCALE MARK (SYRINGE) ×3 IMPLANT
SYR TB 1ML LUER SLIP (SYRINGE) ×3 IMPLANT
WATER STERILE IRR 250ML POUR (IV SOLUTION) ×3 IMPLANT
WATER STERILE IRR 500ML POUR (IV SOLUTION) IMPLANT
WICK EYE OCUCEL (MISCELLANEOUS) IMPLANT
WIPE NON LINTING 3.25X3.25 (MISCELLANEOUS) ×3 IMPLANT

## 2016-02-06 NOTE — Transfer of Care (Signed)
Immediate Anesthesia Transfer of Care Note  Patient: Leslie Parker  Procedure(s) Performed: Procedure(s) with comments: CATARACT EXTRACTION PHACO AND INTRAOCULAR LENS PLACEMENT (IOC) left eye (Left) - DIABETIC LEFT Cannot arrive before 9:30  Patient Location: PACU  Anesthesia Type: MAC  Level of Consciousness: awake, alert  and patient cooperative  Airway and Oxygen Therapy: Patient Spontanous Breathing and Patient connected to supplemental oxygen  Post-op Assessment: Post-op Vital signs reviewed, Patient's Cardiovascular Status Stable, Respiratory Function Stable, Patent Airway and No signs of Nausea or vomiting  Post-op Vital Signs: Reviewed and stable  Complications: No apparent anesthesia complications

## 2016-02-06 NOTE — H&P (Signed)
H+P reviewed and is up to date, please see paper chart.  

## 2016-02-06 NOTE — Anesthesia Postprocedure Evaluation (Signed)
Anesthesia Post Note  Patient: Leslie Parker  Procedure(s) Performed: Procedure(s) (LRB): CATARACT EXTRACTION PHACO AND INTRAOCULAR LENS PLACEMENT (IOC) left eye (Left)  Patient location during evaluation: PACU Anesthesia Type: General Level of consciousness: awake and alert Pain management: pain level controlled Vital Signs Assessment: post-procedure vital signs reviewed and stable Respiratory status: spontaneous breathing, nonlabored ventilation, respiratory function stable and patient connected to nasal cannula oxygen Cardiovascular status: blood pressure returned to baseline and stable Postop Assessment: no signs of nausea or vomiting Anesthetic complications: no    Marshell Levan

## 2016-02-06 NOTE — Anesthesia Preprocedure Evaluation (Addendum)
Anesthesia Evaluation  Patient identified by MRN, date of birth, ID band Patient awake    Airway Mallampati: I  TM Distance: >3 FB Neck ROM: Full    Dental  (+) Upper Dentures, Lower Dentures   Pulmonary COPD,  Chronic - stable   Pulmonary exam normal        Cardiovascular hypertension, Pt. on medications Normal cardiovascular exam     Neuro/Psych    GI/Hepatic   Endo/Other  diabetes, Well Controlled, Type 2  Renal/GU      Musculoskeletal   Abdominal   Peds  Hematology   Anesthesia Other Findings   Reproductive/Obstetrics                            Anesthesia Physical Anesthesia Plan  ASA: III  Anesthesia Plan: MAC   Post-op Pain Management:    Induction:   Airway Management Planned:   Additional Equipment:   Intra-op Plan:   Post-operative Plan:   Informed Consent: I have reviewed the patients History and Physical, chart, labs and discussed the procedure including the risks, benefits and alternatives for the proposed anesthesia with the patient or authorized representative who has indicated his/her understanding and acceptance.     Plan Discussed with: CRNA  Anesthesia Plan Comments:         Anesthesia Quick Evaluation

## 2016-02-06 NOTE — Op Note (Signed)
Date of Surgery: 02/06/2016  PREOPERATIVE DIAGNOSES: Visually significant brunescent nuclear sclerotic cataract, left eye.  POSTOPERATIVE DIAGNOSES: Same  PROCEDURES PERFORMED: Cataract extraction with intraocular lens implant, left eye.  SURGEON: Almon Hercules, M.D.  ANESTHESIA: MAC and topical  IMPLANTS: [id number] +20.0 D  Implant Name Type Inv. Item Serial No. Manufacturer Lot No. LRB No. Used  LENS IOL ACRYSOF IQ 20.0 - [id number] Intraocular Lens LENS IOL ACRYSOF IQ 20.0 [id number] ALCON   Left 1    COMPLICATIONS: None.  DESCRIPTION OF PROCEDURE: Therapeutic options were discussed with the patient preoperatively, including a discussion of risks and benefits of surgery. Informed consent was obtained. An IOL-Master and immersion biometry were used to take the lens measurements, and a dilated fundus exam was performed within 6 months of the surgical date.  The patient was premedicated and brought to the operating room and placed on the operating table in the supine position. After adequate anesthesia, the patient was prepped and draped in the usual sterile ophthalmic fashion. A wire lid speculum was inserted and the microscope was positioned. A Superblade was used to create a paracentesis site at the limbus and a small amount of dilute preservative free lidocaine was instilled into the anterior chamber, followed by dispersive viscoelastic. A clear corneal incision was created temporally using a 2.4 mm keratome blade. Capsulorrhexis was then performed. In situ phacoemulsification was performed.  Cortical material was removed with the irrigation-aspiration unit. Dispersive viscoelastic was instilled to open the capsular bag. A posterior chamber intraocular lens with the specifications above was inserted and positioned. Irrigation-aspiration was used to remove all viscoelastic. Cefuroxime 1cc was instilled into the anterior chamber, and the corneal incision was checked and found to be water  tight. The eyelid speculum was removed.  The operative eye was covered with protective goggles after instilling 1 drop of timolol and brimonidine. The patient tolerated the procedure well. There were no complications.

## 2016-02-06 NOTE — Anesthesia Procedure Notes (Signed)
Procedure Name: MAC Performed by: Claryce Friel Pre-anesthesia Checklist: Patient identified, Emergency Drugs available, Suction available, Timeout performed and Patient being monitored Patient Re-evaluated:Patient Re-evaluated prior to inductionOxygen Delivery Method: Nasal cannula Placement Confirmation: positive ETCO2       

## 2016-02-07 ENCOUNTER — Encounter: Payer: Self-pay | Admitting: Ophthalmology

## 2016-10-10 ENCOUNTER — Other Ambulatory Visit: Payer: Self-pay | Admitting: Orthopedic Surgery

## 2016-10-16 ENCOUNTER — Encounter: Payer: Self-pay | Admitting: *Deleted

## 2016-10-16 ENCOUNTER — Encounter
Admission: RE | Admit: 2016-10-16 | Discharge: 2016-10-16 | Disposition: A | Discharge status: Home / Self Care | Payer: Medicare Other | Source: Ambulatory Visit | Attending: Orthopedic Surgery | Admitting: Orthopedic Surgery

## 2016-10-16 DIAGNOSIS — I10 Essential (primary) hypertension: Secondary | ICD-10-CM | POA: Insufficient documentation

## 2016-10-16 DIAGNOSIS — Z01818 Encounter for other preprocedural examination: Secondary | ICD-10-CM | POA: Diagnosis not present

## 2016-10-16 HISTORY — DX: Malignant (primary) neoplasm, unspecified: C80.1

## 2016-10-16 LAB — CBC WITH DIFFERENTIAL/PLATELET
BASOS ABS: 0.1 10*3/uL (ref 0–0.1)
BASOS PCT: 1 %
EOS ABS: 0.4 10*3/uL (ref 0–0.7)
Eosinophils Relative: 7 %
HEMATOCRIT: 40.2 % (ref 35.0–47.0)
Hemoglobin: 13.9 g/dL (ref 12.0–16.0)
Lymphocytes Relative: 26 %
Lymphs Abs: 1.6 10*3/uL (ref 1.0–3.6)
MCH: 30.7 pg (ref 26.0–34.0)
MCHC: 34.7 g/dL (ref 32.0–36.0)
MCV: 88.7 fL (ref 80.0–100.0)
MONO ABS: 0.5 10*3/uL (ref 0.2–0.9)
Monocytes Relative: 8 %
NEUTROS PCT: 58 %
Neutro Abs: 3.5 10*3/uL (ref 1.4–6.5)
PLATELETS: 211 10*3/uL (ref 150–440)
RBC: 4.54 MIL/uL (ref 3.80–5.20)
RDW: 13.9 % (ref 11.5–14.5)
WBC: 6.2 10*3/uL (ref 3.6–11.0)

## 2016-10-16 LAB — BASIC METABOLIC PANEL
ANION GAP: 12 (ref 5–15)
BUN: 22 mg/dL — ABNORMAL HIGH (ref 6–20)
CALCIUM: 9.7 mg/dL (ref 8.9–10.3)
CO2: 24 mmol/L (ref 22–32)
CREATININE: 1.08 mg/dL — AB (ref 0.44–1.00)
Chloride: 101 mmol/L (ref 101–111)
GFR, EST AFRICAN AMERICAN: 54 mL/min — AB (ref >60)
GFR, EST NON AFRICAN AMERICAN: 47 mL/min — AB (ref >60)
Glucose, Bld: 185 mg/dL — ABNORMAL HIGH (ref 65–99)
Potassium: 3.6 mmol/L (ref 3.5–5.1)
Sodium: 137 mmol/L (ref 135–145)

## 2016-10-16 LAB — APTT: APTT: 28 s (ref 24–36)

## 2016-10-16 LAB — PROTIME-INR
INR: 0.93
PROTHROMBIN TIME: 12.5 s (ref 11.4–15.2)

## 2016-10-16 NOTE — Patient Instructions (Signed)
Your procedure is scheduled ZT:IWPYK 5, 2018 (Thursday) Report to Same Day Surgery 2nd floor medical mall [facility] Entrance-take elevator on left to 2nd floor.  Check in with surgery information desk.) To find out your arrival time please call [phone] between 1PM - 3PM on October 24, 2016 (Wednesday)  Remember: Instructions that are not followed completely may result in serious medical risk, up to and including death, or upon the discretion of your surgeon and anesthesiologist your surgery may need to be rescheduled.    _x___ 1. Do not eat food or drink liquids after midnight. No gum chewing or hard candies.                               __x__ 2. No Alcohol for 24 hours before or after surgery.   __x__3. No Smoking for 24 prior to surgery.   ____  4. Bring all medications with you on the day of surgery if instructed.    __x__ 5. Notify your doctor if there is any change in your medical condition     (cold, fever, infections).     Do not wear jewelry, make-up, hairpins, clips or nail polish.  Do not wear lotions, powders, or perfumes. You may wear deodorant.  Do not shave 48 hours prior to surgery. Men may shave face and neck.  Do not bring valuables to the hospital.    [facility] is not responsible for any belongings or valuables.               Contacts, dentures or bridgework may not be worn into surgery.  Leave your suitcase in the car. After surgery it may be brought to your room.  For patients admitted to the hospital, discharge time is determined by your                       treatment team.   Patients discharged the day of surgery will not be allowed to drive home.  You will need someone to drive you home and stay with you the night of your procedure.    Please read over the following fact sheets that you were given:   [facility] Preparing for Surgery and or MRSA Information   _x___ Take anti-hypertensive (unless it includes a diuretic), cardiac, seizure, asthma,      anti-reflux and psychiatric medicines. These include:  1. AMLODIPINE  2. MAGNESIUM  3.  4.  5.  6.  ____Fleets enema or Magnesium Citrate as directed.   _x___ Use CHG Soap or sage wipes as directed on instruction sheet   _x___ Use inhalers on the day of surgery and bring to hospital day of surgery (USE ADVAIR [place])  _x___ Stop Metformin and Janumet 2 days prior to surgery. (STOP METFORMIN ON APRIL 3)   ____ Take 1/2 of usual insulin dose the night before surgery and none on the morning     surgery.   _x___ Follow recommendations from Cardiologist, Pulmonologist or PCP regarding          stopping Aspirin, Coumadin, Pllavix ,Eliquis, Effient, or Pradaxa, and Pletal.  X____Stop Anti-inflammatories such as Advil, Aleve, Ibuprofen, Motrin, Naproxen, Naprosyn, Goodies powders or aspirin products. OK to take Tylenol   _x___ Stop supplements until after surgery.  But may continue Vitamin D, Vitamin B, and multivitamin.         ____  Bring C-Pap to the hospital.

## 2016-10-25 ENCOUNTER — Ambulatory Visit: Payer: Medicare Other | Admitting: Anesthesiology

## 2016-10-25 ENCOUNTER — Encounter: Payer: Self-pay | Admitting: *Deleted

## 2016-10-25 ENCOUNTER — Encounter
Admission: RE | Disposition: A | Discharge status: Home / Self Care | Payer: Self-pay | Source: Ambulatory Visit | Attending: Orthopedic Surgery

## 2016-10-25 ENCOUNTER — Ambulatory Visit
Admission: RE | Admit: 2016-10-25 | Discharge: 2016-10-25 | Disposition: A | Discharge status: Home / Self Care | Payer: Medicare Other | Source: Ambulatory Visit | Attending: Orthopedic Surgery | Admitting: Orthopedic Surgery

## 2016-10-25 DIAGNOSIS — Z7951 Long term (current) use of inhaled steroids: Secondary | ICD-10-CM | POA: Insufficient documentation

## 2016-10-25 DIAGNOSIS — M7542 Impingement syndrome of left shoulder: Secondary | ICD-10-CM | POA: Diagnosis present

## 2016-10-25 DIAGNOSIS — Z7984 Long term (current) use of oral hypoglycemic drugs: Secondary | ICD-10-CM | POA: Insufficient documentation

## 2016-10-25 DIAGNOSIS — E119 Type 2 diabetes mellitus without complications: Secondary | ICD-10-CM | POA: Diagnosis not present

## 2016-10-25 DIAGNOSIS — M75112 Incomplete rotator cuff tear or rupture of left shoulder, not specified as traumatic: Secondary | ICD-10-CM | POA: Insufficient documentation

## 2016-10-25 DIAGNOSIS — J449 Chronic obstructive pulmonary disease, unspecified: Secondary | ICD-10-CM | POA: Insufficient documentation

## 2016-10-25 DIAGNOSIS — I10 Essential (primary) hypertension: Secondary | ICD-10-CM | POA: Insufficient documentation

## 2016-10-25 DIAGNOSIS — K219 Gastro-esophageal reflux disease without esophagitis: Secondary | ICD-10-CM | POA: Insufficient documentation

## 2016-10-25 DIAGNOSIS — M19012 Primary osteoarthritis, left shoulder: Secondary | ICD-10-CM | POA: Diagnosis not present

## 2016-10-25 HISTORY — PX: SHOULDER ARTHROSCOPY WITH OPEN ROTATOR CUFF REPAIR AND DISTAL CLAVICLE ACROMINECTOMY: SHX5683

## 2016-10-25 LAB — GLUCOSE, CAPILLARY
GLUCOSE-CAPILLARY: 171 mg/dL — AB (ref 65–99)
Glucose-Capillary: 171 mg/dL — ABNORMAL HIGH (ref 65–99)

## 2016-10-25 SURGERY — SHOULDER ARTHROSCOPY WITH OPEN ROTATOR CUFF REPAIR AND DISTAL CLAVICLE ACROMINECTOMY
Anesthesia: General | Site: Shoulder | Laterality: Left | Wound class: Clean

## 2016-10-25 MED ORDER — ARTIFICIAL TEARS OP OINT
TOPICAL_OINTMENT | OPHTHALMIC | Status: AC
  Filled 2016-10-25: qty 3.5

## 2016-10-25 MED ORDER — NEOMYCIN-POLYMYXIN B GU 40-200000 IR SOLN
Status: DC | PRN
  Administered 2016-10-25: 2 mL

## 2016-10-25 MED ORDER — MIDAZOLAM HCL 2 MG/2ML IJ SOLN
INTRAMUSCULAR | Status: DC | PRN
  Administered 2016-10-25 (×2): 1 mg via INTRAVENOUS

## 2016-10-25 MED ORDER — ARTIFICIAL TEARS OP OINT
TOPICAL_OINTMENT | OPHTHALMIC | Status: DC | PRN
  Administered 2016-10-25: 1 via OPHTHALMIC

## 2016-10-25 MED ORDER — METHYLPREDNISOLONE SODIUM SUCC 125 MG IJ SOLR
INTRAMUSCULAR | Status: DC | PRN
  Administered 2016-10-25: 125 mg via INTRAVENOUS

## 2016-10-25 MED ORDER — GLYCOPYRROLATE 0.2 MG/ML IJ SOLN
INTRAMUSCULAR | Status: AC
  Filled 2016-10-25: qty 3

## 2016-10-25 MED ORDER — ACETAMINOPHEN 10 MG/ML IV SOLN
INTRAVENOUS | Status: AC
  Filled 2016-10-25: qty 100

## 2016-10-25 MED ORDER — OXYCODONE HCL 5 MG PO TABS: 5.0000 mg | ORAL_TABLET | ORAL | 0 refills | Status: DC | PRN

## 2016-10-25 MED ORDER — DEXAMETHASONE SODIUM PHOSPHATE 10 MG/ML IJ SOLN
INTRAMUSCULAR | Status: DC | PRN
  Administered 2016-10-25: 10 mg via INTRAVENOUS

## 2016-10-25 MED ORDER — FENTANYL CITRATE (PF) 100 MCG/2ML IJ SOLN
25.0000 ug | INTRAMUSCULAR | Status: DC | PRN
  Administered 2016-10-25 (×4): 25 ug via INTRAVENOUS

## 2016-10-25 MED ORDER — DEXAMETHASONE SODIUM PHOSPHATE 10 MG/ML IJ SOLN
INTRAMUSCULAR | Status: AC
  Filled 2016-10-25: qty 1

## 2016-10-25 MED ORDER — CEFAZOLIN SODIUM-DEXTROSE 2-4 GM/100ML-% IV SOLN
INTRAVENOUS | Status: AC
  Filled 2016-10-25: qty 100

## 2016-10-25 MED ORDER — ROCURONIUM BROMIDE 100 MG/10ML IV SOLN
INTRAVENOUS | Status: DC | PRN
  Administered 2016-10-25: 10 mg via INTRAVENOUS
  Administered 2016-10-25: 30 mg via INTRAVENOUS

## 2016-10-25 MED ORDER — FAMOTIDINE 20 MG PO TABS
20.0000 mg | ORAL_TABLET | Freq: Once | ORAL | Status: AC
  Administered 2016-10-25: 20 mg via ORAL

## 2016-10-25 MED ORDER — EPINEPHRINE PF 1 MG/ML IJ SOLN
INTRAMUSCULAR | Status: DC | PRN
  Administered 2016-10-25: 12 mL

## 2016-10-25 MED ORDER — ONDANSETRON HCL 4 MG/2ML IJ SOLN
INTRAMUSCULAR | Status: AC
  Filled 2016-10-25: qty 2

## 2016-10-25 MED ORDER — SODIUM CHLORIDE 0.9 % IV SOLN
INTRAVENOUS | Status: DC
  Administered 2016-10-25 (×2): via INTRAVENOUS

## 2016-10-25 MED ORDER — CEFAZOLIN SODIUM-DEXTROSE 2-3 GM-% IV SOLR
INTRAVENOUS | Status: DC | PRN
  Administered 2016-10-25: 2 g via INTRAVENOUS

## 2016-10-25 MED ORDER — OXYCODONE HCL 5 MG PO TABS
ORAL_TABLET | ORAL | Status: AC
  Administered 2016-10-25: 10 mg via ORAL
  Filled 2016-10-25: qty 2

## 2016-10-25 MED ORDER — LIDOCAINE HCL (PF) 2 % IJ SOLN
INTRAMUSCULAR | Status: AC
  Filled 2016-10-25: qty 2

## 2016-10-25 MED ORDER — PHENYLEPHRINE HCL 10 MG/ML IJ SOLN
INTRAMUSCULAR | Status: AC
  Filled 2016-10-25: qty 1

## 2016-10-25 MED ORDER — FENTANYL CITRATE (PF) 100 MCG/2ML IJ SOLN
INTRAMUSCULAR | Status: AC
  Filled 2016-10-25: qty 2

## 2016-10-25 MED ORDER — PHENYLEPHRINE HCL 10 MG/ML IJ SOLN
INTRAMUSCULAR | Status: DC | PRN
  Administered 2016-10-25 (×3): 100 ug via INTRAVENOUS

## 2016-10-25 MED ORDER — NEOMYCIN-POLYMYXIN B GU 40-200000 IR SOLN
Status: AC
  Filled 2016-10-25: qty 2

## 2016-10-25 MED ORDER — MIDAZOLAM HCL 2 MG/2ML IJ SOLN
INTRAMUSCULAR | Status: AC
  Filled 2016-10-25: qty 2

## 2016-10-25 MED ORDER — GLYCOPYRROLATE 0.2 MG/ML IJ SOLN
INTRAMUSCULAR | Status: DC | PRN
  Administered 2016-10-25: 0.6 mg via INTRAVENOUS

## 2016-10-25 MED ORDER — BUPIVACAINE HCL (PF) 0.25 % IJ SOLN
INTRAMUSCULAR | Status: DC | PRN
  Administered 2016-10-25: 30 mL

## 2016-10-25 MED ORDER — FENTANYL CITRATE (PF) 100 MCG/2ML IJ SOLN
INTRAMUSCULAR | Status: DC | PRN
  Administered 2016-10-25 (×3): 50 ug via INTRAVENOUS

## 2016-10-25 MED ORDER — ONDANSETRON HCL 4 MG/2ML IJ SOLN
INTRAMUSCULAR | Status: DC | PRN
  Administered 2016-10-25: 4 mg via INTRAVENOUS

## 2016-10-25 MED ORDER — BUPIVACAINE-EPINEPHRINE (PF) 0.5% -1:200000 IJ SOLN
INTRAMUSCULAR | Status: AC
  Filled 2016-10-25: qty 30

## 2016-10-25 MED ORDER — METHYLPREDNISOLONE SODIUM SUCC 125 MG IJ SOLR
INTRAMUSCULAR | Status: AC
  Filled 2016-10-25: qty 2

## 2016-10-25 MED ORDER — OXYCODONE HCL 5 MG PO TABS
5.0000 mg | ORAL_TABLET | ORAL | Status: DC | PRN
  Administered 2016-10-25: 10 mg via ORAL
  Filled 2016-10-25: qty 2

## 2016-10-25 MED ORDER — LIDOCAINE HCL (PF) 1 % IJ SOLN
INTRAMUSCULAR | Status: AC
  Filled 2016-10-25: qty 30

## 2016-10-25 MED ORDER — NEOSTIGMINE METHYLSULFATE 10 MG/10ML IV SOLN
INTRAVENOUS | Status: DC | PRN
  Administered 2016-10-25: 3 mg via INTRAVENOUS

## 2016-10-25 MED ORDER — FENTANYL CITRATE (PF) 100 MCG/2ML IJ SOLN
INTRAMUSCULAR | Status: AC
  Administered 2016-10-25: 25 ug via INTRAVENOUS
  Filled 2016-10-25: qty 2

## 2016-10-25 MED ORDER — SODIUM CHLORIDE 0.9 % IJ SOLN
INTRAMUSCULAR | Status: AC
  Filled 2016-10-25: qty 10

## 2016-10-25 MED ORDER — ONDANSETRON HCL 4 MG PO TABS: 4.0000 mg | ORAL_TABLET | Freq: Three times a day (TID) | ORAL | 0 refills | Status: DC | PRN

## 2016-10-25 MED ORDER — CHLORHEXIDINE GLUCONATE CLOTH 2 % EX PADS: 6.0000 | MEDICATED_PAD | Freq: Once | CUTANEOUS | Status: DC

## 2016-10-25 MED ORDER — CEFAZOLIN SODIUM-DEXTROSE 2-4 GM/100ML-% IV SOLN: 2.0000 g | INTRAVENOUS | Status: DC

## 2016-10-25 MED ORDER — PHENYLEPHRINE HCL 10 MG/ML IJ SOLN
INTRAVENOUS | Status: DC | PRN
  Administered 2016-10-25: 10 ug/min via INTRAVENOUS

## 2016-10-25 MED ORDER — LIDOCAINE HCL (CARDIAC) 20 MG/ML IV SOLN
INTRAVENOUS | Status: DC | PRN
  Administered 2016-10-25: 30 mg via INTRAVENOUS

## 2016-10-25 MED ORDER — ACETAMINOPHEN 10 MG/ML IV SOLN
INTRAVENOUS | Status: DC | PRN
  Administered 2016-10-25: 1000 mg via INTRAVENOUS

## 2016-10-25 MED ORDER — EPINEPHRINE 30 MG/30ML IJ SOLN
INTRAMUSCULAR | Status: AC
  Filled 2016-10-25: qty 1

## 2016-10-25 MED ORDER — LIDOCAINE HCL (PF) 1 % IJ SOLN
INTRAMUSCULAR | Status: DC | PRN
  Administered 2016-10-25: 15 mL

## 2016-10-25 MED ORDER — PROPOFOL 10 MG/ML IV BOLUS
INTRAVENOUS | Status: DC | PRN
  Administered 2016-10-25: 100 mg via INTRAVENOUS

## 2016-10-25 MED ORDER — FAMOTIDINE 20 MG PO TABS
ORAL_TABLET | ORAL | Status: AC
  Administered 2016-10-25: 20 mg via ORAL
  Filled 2016-10-25: qty 1

## 2016-10-25 MED ORDER — BUPIVACAINE HCL (PF) 0.25 % IJ SOLN
INTRAMUSCULAR | Status: AC
  Filled 2016-10-25: qty 30

## 2016-10-25 MED ORDER — ROCURONIUM BROMIDE 50 MG/5ML IV SOLN
INTRAVENOUS | Status: AC
  Filled 2016-10-25: qty 1

## 2016-10-25 MED ORDER — PROPOFOL 10 MG/ML IV BOLUS
INTRAVENOUS | Status: AC
  Filled 2016-10-25: qty 20

## 2016-10-25 MED ORDER — ONDANSETRON HCL 4 MG/2ML IJ SOLN: 4.0000 mg | Freq: Once | INTRAMUSCULAR | Status: DC | PRN

## 2016-10-25 MED ORDER — EPHEDRINE SULFATE 50 MG/ML IJ SOLN
INTRAMUSCULAR | Status: AC
  Filled 2016-10-25: qty 1

## 2016-10-25 SURGICAL SUPPLY — 78 items
ADAPTER IRRIG TUBE 2 SPIKE SOL (ADAPTER) ×6 IMPLANT
ANCHOR ALL-SUT Q-FIX 2.8 (Anchor) ×3 IMPLANT
BUR RADIUS 4.0X18.5 (BURR) ×3 IMPLANT
BUR RADIUS 5.5 (BURR) ×3 IMPLANT
CANISTER SUCT LVC 12 LTR MEDI- (MISCELLANEOUS) ×3 IMPLANT
CANNULA 5.75X7 CRYSTAL CLEAR (CANNULA) ×6 IMPLANT
CANNULA PARTIAL THREAD 2X7 (CANNULA) ×3 IMPLANT
CANNULA TWIST IN 8.25X9CM (CANNULA) ×6 IMPLANT
CLOSURE WOUND 1/2 X4 (GAUZE/BANDAGES/DRESSINGS) ×1
CONNECTOR PERFECT PASSER (CONNECTOR) ×3 IMPLANT
COOLER POLAR GLACIER W/PUMP (MISCELLANEOUS) ×3 IMPLANT
CRADLE LAMINECT ARM (MISCELLANEOUS) ×6 IMPLANT
DECANTER SPIKE VIAL GLASS SM (MISCELLANEOUS) ×6 IMPLANT
DEVICE SUCT BLK HOLE OR FLOOR (MISCELLANEOUS) ×6 IMPLANT
DRAPE IMP U-DRAPE 54X76 (DRAPES) ×6 IMPLANT
DRAPE INCISE IOBAN 66X45 STRL (DRAPES) ×3 IMPLANT
DRAPE SHEET LG 3/4 BI-LAMINATE (DRAPES) ×3 IMPLANT
DRAPE U-SHAPE 47X51 STRL (DRAPES) ×3 IMPLANT
DURAPREP 26ML APPLICATOR (WOUND CARE) ×12 IMPLANT
ELECT REM PT RETURN 9FT ADLT (ELECTROSURGICAL) ×3
ELECTRODE REM PT RTRN 9FT ADLT (ELECTROSURGICAL) ×1 IMPLANT
GAUZE PETRO XEROFOAM 1X8 (MISCELLANEOUS) ×3 IMPLANT
GAUZE SPONGE 4X4 12PLY STRL (GAUZE/BANDAGES/DRESSINGS) ×3 IMPLANT
GAUZE XEROFORM 4X4 STRL (GAUZE/BANDAGES/DRESSINGS) ×3 IMPLANT
GLOVE BIOGEL PI IND STRL 7.0 (GLOVE) ×3 IMPLANT
GLOVE BIOGEL PI IND STRL 9 (GLOVE) ×1 IMPLANT
GLOVE BIOGEL PI INDICATOR 7.0 (GLOVE) ×6
GLOVE BIOGEL PI INDICATOR 9 (GLOVE) ×2
GLOVE SURG 9.0 ORTHO LTXF (GLOVE) ×9 IMPLANT
GLOVE SURG SYN 7.0 (GLOVE) ×9 IMPLANT
GOWN STRL REUS TWL 2XL XL LVL4 (GOWN DISPOSABLE) ×3 IMPLANT
GOWN STRL REUS W/ TWL LRG LVL3 (GOWN DISPOSABLE) ×3 IMPLANT
GOWN STRL REUS W/TWL LRG LVL3 (GOWN DISPOSABLE) ×6
IV LACTATED RINGER IRRG 3000ML (IV SOLUTION) ×24
IV LR IRRIG 3000ML ARTHROMATIC (IV SOLUTION) ×12 IMPLANT
KIT RM TURNOVER STRD PROC AR (KITS) ×3 IMPLANT
KIT STABILIZATION SHOULDER (MISCELLANEOUS) ×3 IMPLANT
KIT SUTURE 2.8 Q-FIX DISP (MISCELLANEOUS) ×3 IMPLANT
KIT SUTURETAK 3.0 INSERT PERC (KITS) IMPLANT
MANIFOLD NEPTUNE II (INSTRUMENTS) ×3 IMPLANT
MASK FACE SPIDER DISP (MASK) ×3 IMPLANT
MAT BLUE FLOOR 46X72 FLO (MISCELLANEOUS) ×6 IMPLANT
NDL SAFETY 18GX1.5 (NEEDLE) ×3 IMPLANT
NDL SAFETY 22GX1.5 (NEEDLE) ×3 IMPLANT
NDL SAFETY ECLIPSE 18X1.5 (NEEDLE) ×1 IMPLANT
NEEDLE FILTER BLUNT 18X 1/2SAF (NEEDLE) ×2
NEEDLE FILTER BLUNT 18X1 1/2 (NEEDLE) ×1 IMPLANT
NEEDLE HYPO 18GX1.5 SHARP (NEEDLE) ×2
NS IRRIG 500ML POUR BTL (IV SOLUTION) ×3 IMPLANT
PACK ARTHROSCOPY SHOULDER (MISCELLANEOUS) ×3 IMPLANT
PAD WRAPON POLAR SHDR XLG (MISCELLANEOUS) ×1 IMPLANT
PASSER SUT CAPTURE FIRST (SUTURE) ×3 IMPLANT
SET TUBE SUCT SHAVER OUTFL 24K (TUBING) ×3 IMPLANT
SET TUBE TIP INTRA-ARTICULAR (MISCELLANEOUS) ×3 IMPLANT
SLING ULTRA II M (MISCELLANEOUS) ×3 IMPLANT
STRIP CLOSURE SKIN 1/2X4 (GAUZE/BANDAGES/DRESSINGS) ×2 IMPLANT
SUT ETHILON 4-0 (SUTURE) ×2
SUT ETHILON 4-0 FS2 18XMFL BLK (SUTURE) ×1
SUT KNTLS 2.8 MAGNUM (Anchor) ×6 IMPLANT
SUT LASSO 90 DEG SD STR (SUTURE) IMPLANT
SUT MNCRL 4-0 (SUTURE) ×2
SUT MNCRL 4-0 27XMFL (SUTURE) ×1
SUT PDS AB 0 CT1 27 (SUTURE) ×3 IMPLANT
SUT PERFECTPASSER WHITE CART (SUTURE) ×6 IMPLANT
SUT SMART STITCH CARTRIDGE (SUTURE) ×6 IMPLANT
SUT VIC AB 0 CT1 36 (SUTURE) ×3 IMPLANT
SUT VIC AB 2-0 CT2 27 (SUTURE) ×3 IMPLANT
SUTURE ETHLN 4-0 FS2 18XMF BLK (SUTURE) ×1 IMPLANT
SUTURE MAGNUM WIRE 2X48 BLK (SUTURE) IMPLANT
SUTURE MNCRL 4-0 27XMF (SUTURE) ×1 IMPLANT
SYR 10ML LL (SYRINGE) ×6 IMPLANT
SYRINGE 10CC LL (SYRINGE) ×3 IMPLANT
TAPE MICROFOAM 4IN (TAPE) ×3 IMPLANT
TUBING ARTHRO INFLOW-ONLY STRL (TUBING) ×3 IMPLANT
TUBING CONNECTING 10 (TUBING) ×2 IMPLANT
TUBING CONNECTING 10' (TUBING) ×1
WAND HAND CNTRL MULTIVAC 90 (MISCELLANEOUS) ×3 IMPLANT
WRAPON POLAR PAD SHDR XLG (MISCELLANEOUS) ×3

## 2016-10-25 NOTE — Anesthesia Post-op Follow-up Note (Cosign Needed)
Anesthesia QCDR form completed.        

## 2016-10-25 NOTE — Anesthesia Postprocedure Evaluation (Signed)
Anesthesia Post Note  Patient: Leslie Parker  Procedure(s) Performed: Procedure(s) (LRB): SHOULDER ARTHROSCOPY WITH OPEN ROTATOR CUFF REPAIR AND DISTAL CLAVICLE ACROMINECTOMY (Left)  Patient location during evaluation: PACU Anesthesia Type: General Level of consciousness: awake and alert and oriented Pain management: pain level controlled Vital Signs Assessment: post-procedure vital signs reviewed and stable Respiratory status: spontaneous breathing Cardiovascular status: blood pressure returned to baseline Anesthetic complications: no     Last Vitals:  Vitals:   10/25/16 1219 10/25/16 1305  BP: (!) 145/77 (!) 157/69  Pulse: 71 80  Resp: 16 18  Temp: 36.3 C     Last Pain:  Vitals:   10/25/16 1305  TempSrc:   PainSc: 5                  Vanassa Penniman

## 2016-10-25 NOTE — Anesthesia Procedure Notes (Signed)
Procedure Name: Intubation Date/Time: 10/25/2016 7:56 AM Performed by: Courtney Paris Pre-anesthesia Checklist: Patient identified, Patient being monitored, Timeout performed, Emergency Drugs available and Suction available Patient Re-evaluated:Patient Re-evaluated prior to inductionOxygen Delivery Method: Circle system utilized Preoxygenation: Pre-oxygenation with 100% oxygen Intubation Type: IV induction Ventilation: Mask ventilation without difficulty Laryngoscope Size: 3 and Miller Grade View: Grade II Tube type: Oral Tube size: 7.0 mm Number of attempts: 1 Airway Equipment and Method: Stylet Placement Confirmation: ETT inserted through vocal cords under direct vision,  positive ETCO2,  breath sounds checked- equal and bilateral and CO2 detector Secured at: 21 cm Tube secured with: Tape Dental Injury: Teeth and Oropharynx as per pre-operative assessment

## 2016-10-25 NOTE — Anesthesia Preprocedure Evaluation (Signed)
Anesthesia Evaluation  Patient identified by MRN, date of birth, ID band Patient awake    Reviewed: Allergy & Precautions, NPO status , Patient's Chart, lab work & pertinent test results  History of Anesthesia Complications Negative for: history of anesthetic complications  Airway Mallampati: III  TM Distance: >3 FB Neck ROM: Full    Dental  (+) Upper Dentures, Lower Dentures   Pulmonary shortness of breath and with exertion, asthma , COPD,  COPD inhaler,           Cardiovascular hypertension, Pt. on medications + Valvular Problems/Murmurs      Neuro/Psych negative neurological ROS  negative psych ROS   GI/Hepatic negative GI ROS, Neg liver ROS, GERD  Medicated and Controlled,  Endo/Other  diabetes, Well Controlled, Type 2, Oral Hypoglycemic Agents  Renal/GU negative Renal ROS  negative genitourinary   Musculoskeletal  (+) Arthritis ,   Abdominal   Peds negative pediatric ROS (+)  Hematology   Anesthesia Other Findings   Reproductive/Obstetrics                             Anesthesia Physical  Anesthesia Plan  ASA: III  Anesthesia Plan: General   Post-op Pain Management:    Induction: Intravenous  Airway Management Planned: Oral ETT  Additional Equipment:   Intra-op Plan:   Post-operative Plan: Extubation in OR  Informed Consent: I have reviewed the patients History and Physical, chart, labs and discussed the procedure including the risks, benefits and alternatives for the proposed anesthesia with the patient or authorized representative who has indicated his/her understanding and acceptance.     Plan Discussed with: CRNA and Surgeon  Anesthesia Plan Comments: (History of significant of acute on chronic COPD... Will not do block)        Anesthesia Quick Evaluation

## 2016-10-25 NOTE — H&P (Signed)
The patient has been re-examined, and the chart reviewed, and there have been no interval changes to the documented history and physical.    The risks, benefits, and alternatives have been discussed at length, and the patient is willing to proceed.   

## 2016-10-25 NOTE — Discharge Instructions (Signed)

## 2016-10-25 NOTE — Op Note (Signed)
10/25/2016  11:03 AM  PATIENT:  Leslie Parker  83 y.o. 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 female with a diagnosis Impingement syndrome and acromioclavicular joint arthrosisof left shoulder failed conservative treatment and elected for surgical management.    The risks benefits and alternatives were discussed with the patient preoperatively including but not limited to the risks of infection, bleeding, nerve injury, persistent pain or weakness, failure of the hardware, re-tear of the rotator cuff and the need for further surgery. Medical risks include DVT and pulmonary embolism, myocardial infarction, stroke, pneumonia, respiratory failure and death. Patient understood these risks and wished to proceed.  OPERATIVE IMPLANTS: ArthroCare Magnum 2 anchors x 2 & [employer]hew Q Fix anchors x 1  OPERATIVE PROCEDURE: The patient was met in the preoperative area. The left shoulder was signed with the word yes and my initials according the hospital's correct site of surgery protocol. The patient is brought to the OR and underwent general endotracheal intubation by the anesthesia service.  The patient was placed in a beachchair position. A spider arm positioner was used for this case. Examination under anesthesia revealed no instability to load-and-shift testing.  There is no significant limitation of motion.  The patient was prepped and draped in a sterile fashion. A timeout was  performed to verify the patient's name, date of birth, medical record number, correct site of surgery and correct procedure to be performed there was also used to verify the patient received antibiotics that all appropriate instruments, implants and radiographs studies were available in the room. Once all in attendance were in agreement case began.  Bony landmarks were drawn out with a surgical marker along with proposed arthroscopy incisions. These were pre-injected with 1% lidocaine plain. An 11 blade was used to establish a posterior portal through which the arthroscope was placed in the glenohumeral joint. A full diagnostic examination of the shoulder was performed. The anterior portal was established under direct visualization with an 18-gauge spinal needle.  A 5.75 mm arthroscopic cannula was placed through the anterior portal.   The intra-articular portion of the biceps tendon was found to have a partial tear involving greater than 50% of the diameter. Therefore the decision was made to perform a tenotomy. A 4.0 mm resector shaver blade was used to release the biceps tendon off the superior labrum. The arthroscopic shaver was then used to debride the frayed edges of the superior labrum.no anterior or posterior labral tears were encountered. Subscapularis tendon was intact. Patient had a high-grade partial thickness tear involving the supraspinatus. There were no loose bodies within the inferior recess and no evidence of HAGL lesion.  The arthroscope was then placed in the subacromial space. A lateral portal was then established using an 18-gauge spinal needle for localization.   Subacromial bursitis debrided using a 4-0 resector shaver blade and a 90 ArthroCare wand from the lateral portal. A subacromial decompression was also performed using a 5.5 mm resector shaver blade from the lateral portal. The 5.5 mm resector shaver blade  was then placed through the anterior portal and distal clavicle  excision was performed.The 0 PDS suture was identified. The rotator cuff tear was debrided of all frayed edges. The rotator cuff tear was completed using the shaver blade. This was confirmed by putting the arthroscope back in the glenohumeral joint. The greater tuberosity was debrided using a 5.5 mm resector shaver blade to remove all remaining foreign fibers of the rotator cuff.  Debridement was performed until punctate bleeding was seen at the greater tuberosity footprint, which will allow for rotator cuff healing.   A single ArthroCare Perfect Pass suture was placed in the lateral border of the rotator cuff tear. All arthroscopic instruments were then removed and the mini-open portion of the procedure began.   A saber-type incision was made along the lateral border of the acromion. The deltoid muscle was identified and split in line with its fibers which allowed visualization of the rotator cuff. The Perfect Pass suture previously placed in the lateral border of the rotator cuff wasalso brought out through the deltoid split. Another Perfect Pass Suture was placed in the border of the rotator cuff.  A single Q-Fix anchor was then placed at the articular margin of the humeral head and greater tuberosity. The four suture limbs of the Q Fix anchor was passed medially through the rotator cuff using a first pass suture passer. The Perfect Pass sutures from the lateral border of the rotator cuff were then anchored to thegreater tuberosity of the humeral head using two Magnum 2 anchors. These anchors were tensioned to allow for anatomic reduction of the rotator cuff to the greater tuberosity footprint. The medial row repair was then completed using an arthroscopic knot tying technique with the Q fix anchor sutures. Once all sutures were tied down, arthroscopic images of the double row repair were taken with the arthroscope both externally and arthroscopically fromthe glenohumeral joint  All incisions were  copiously irrigated. The deltoid fascia was repaired using a 0 Vicryl suturean interrupted fashion. The subcutaneous tissue of all incisions were closed with a 2-0 Vicryl. Skin closure for the arthroscopic incisions was performed with 4-0 nylon. The skin edges of the saber incision were approximated with a running 4-0 undyed Monocryl. 0.25%  Marcaine plain was injected into the subacromial space and at the injection sites.  A dry sterile dressing including Steri-Strips was applied . The patient was placed in an abduction sling, with a Polar Care sleeve.  All sharp and instrument counts were correct at the conclusion of the case. I was scrubbed and present for the entire case. I spoke with the patient's daughter in the post-op consultation room and informed her that the case had been performed without complication and the patient was stable in recovery room.     Timoteo Gaul, MD

## 2016-10-25 NOTE — Transfer of Care (Signed)
Immediate Anesthesia Transfer of Care Note  Patient: Leslie Parker  Procedure(s) Performed: Procedure(s): SHOULDER ARTHROSCOPY WITH OPEN ROTATOR CUFF REPAIR AND DISTAL CLAVICLE ACROMINECTOMY (Left)  Patient Location: PACU  Anesthesia Type:General  Level of Consciousness: awake and sedated  Airway & Oxygen Therapy: Patient Spontanous Breathing and Patient connected to face mask oxygen  Post-op Assessment: Report given to RN and Post -op Vital signs reviewed and stable  Post vital signs: Reviewed and stable  Last Vitals:  Vitals:   10/25/16 0634  BP: (!) 164/82  Pulse: 91  Resp: 20  Temp: 36.8 C    Last Pain:  Vitals:   10/25/16 0634  TempSrc: Oral      Patients Stated Pain Goal: 2 (82/06/01 5615)  Complications: No apparent anesthesia complications

## 2017-01-01 ENCOUNTER — Other Ambulatory Visit: Payer: Self-pay | Admitting: Orthopedic Surgery

## 2017-01-01 ENCOUNTER — Other Ambulatory Visit (HOSPITAL_COMMUNITY): Payer: Self-pay | Admitting: Orthopedic Surgery

## 2017-01-01 DIAGNOSIS — M25552 Pain in left hip: Secondary | ICD-10-CM

## 2017-01-04 ENCOUNTER — Ambulatory Visit
Admission: RE | Admit: 2017-01-04 | Discharge: 2017-01-04 | Disposition: A | Discharge status: Home / Self Care | Payer: Medicare Other | Source: Ambulatory Visit | Attending: Orthopedic Surgery | Admitting: Orthopedic Surgery

## 2017-01-04 DIAGNOSIS — M25552 Pain in left hip: Secondary | ICD-10-CM | POA: Diagnosis not present

## 2017-01-04 DIAGNOSIS — Z96698 Presence of other orthopedic joint implants: Secondary | ICD-10-CM | POA: Diagnosis not present

## 2017-01-04 DIAGNOSIS — I70209 Unspecified atherosclerosis of native arteries of extremities, unspecified extremity: Secondary | ICD-10-CM | POA: Insufficient documentation

## 2017-01-13 ENCOUNTER — Other Ambulatory Visit: Payer: Self-pay | Admitting: Orthopedic Surgery

## 2017-01-14 ENCOUNTER — Encounter
Admission: RE | Admit: 2017-01-14 | Discharge: 2017-01-14 | Disposition: A | Discharge status: Home / Self Care | Payer: Medicare Other | Source: Ambulatory Visit | Attending: Orthopedic Surgery | Admitting: Orthopedic Surgery

## 2017-01-14 DIAGNOSIS — M25552 Pain in left hip: Secondary | ICD-10-CM | POA: Insufficient documentation

## 2017-01-14 DIAGNOSIS — Z01812 Encounter for preprocedural laboratory examination: Secondary | ICD-10-CM | POA: Insufficient documentation

## 2017-01-14 LAB — CBC WITH DIFFERENTIAL/PLATELET
BASOS ABS: 0.1 10*3/uL (ref 0–0.1)
Basophils Relative: 1 %
EOS ABS: 0.5 10*3/uL (ref 0–0.7)
EOS PCT: 8 %
HCT: 41 % (ref 35.0–47.0)
Hemoglobin: 14.1 g/dL (ref 12.0–16.0)
LYMPHS PCT: 31 %
Lymphs Abs: 2.1 10*3/uL (ref 1.0–3.6)
MCH: 30.4 pg (ref 26.0–34.0)
MCHC: 34.3 g/dL (ref 32.0–36.0)
MCV: 88.7 fL (ref 80.0–100.0)
Monocytes Absolute: 0.5 10*3/uL (ref 0.2–0.9)
Monocytes Relative: 7 %
Neutro Abs: 3.6 10*3/uL (ref 1.4–6.5)
Neutrophils Relative %: 53 %
PLATELETS: 294 10*3/uL (ref 150–440)
RBC: 4.62 MIL/uL (ref 3.80–5.20)
RDW: 13.6 % (ref 11.5–14.5)
WBC: 6.8 10*3/uL (ref 3.6–11.0)

## 2017-01-14 LAB — APTT: APTT: 27 s (ref 24–36)

## 2017-01-14 LAB — PROTIME-INR
INR: 0.94
PROTHROMBIN TIME: 12.6 s (ref 11.4–15.2)

## 2017-01-14 LAB — BASIC METABOLIC PANEL
Anion gap: 9 (ref 5–15)
BUN: 27 mg/dL — AB (ref 6–20)
CO2: 25 mmol/L (ref 22–32)
CREATININE: 1.1 mg/dL — AB (ref 0.44–1.00)
Calcium: 9.6 mg/dL (ref 8.9–10.3)
Chloride: 104 mmol/L (ref 101–111)
GFR calc Af Amer: 53 mL/min — ABNORMAL LOW (ref >60)
GFR, EST NON AFRICAN AMERICAN: 45 mL/min — AB (ref >60)
Glucose, Bld: 148 mg/dL — ABNORMAL HIGH (ref 65–99)
Potassium: 4 mmol/L (ref 3.5–5.1)
SODIUM: 138 mmol/L (ref 135–145)

## 2017-01-14 NOTE — Patient Instructions (Signed)
Your procedure is scheduled on: January 17, 2017 (THURSDAY) Report to Same Day Surgery 2nd floor medical mall ([place] Entrance-take elevator on left to 2nd floor.  Check in with surgery information desk.) To find out your arrival time please call [phone] between 1PM - 3PM on January 16, 2017 [facility])   Remember: Instructions that are not followed completely may result in serious medical risk, up to and including death, or upon the discretion of your surgeon and anesthesiologist your surgery may need to be rescheduled.    _x___ 1. Do not eat food or drink liquids after midnight. No gum chewing or hard candies                          __x__ 2. No Alcohol for 24 hours before or after surgery.   __x__3. No Smoking for 24 prior to surgery.   ____  4. Bring all medications with you on the day of surgery if instructed.    __x__ 5. Notify your doctor if there is any change in your medical condition     (cold, fever, infections).     Do not wear jewelry, make-up, hairpins, clips or nail polish.  Do not wear lotions, powders, or perfumes. .  Do not shave 48 hours prior to surgery. Men may shave face and neck.  Do not bring valuables to the hospital.    [facility] is not responsible for any belongings or valuables.               Contacts, dentures or bridgework may not be worn into surgery.  Leave your suitcase in the car. After surgery it may be brought to your room.  For patients admitted to the hospital, discharge time is determined by your treatment team                     Patients discharged the day of surgery will not be allowed to drive home.  You will need someone to drive you home and stay with you the night of your procedure.    Please read over the following fact sheets that you were given:   [facility] Preparing for Surgery and or MRSA Information   _x___ Take the following medications the morning of surgery with a sip of water :  1. AMLODIPINE  2. LISINOPRIL  3.  MAGNESIUM OXIDE    ____Fleets enema or Magnesium Citrate as directed.   _x___ Use CHG Soap or sage wipes as directed on instruction sheet   _x___ Use inhalers on the day of surgery and bring to hospital day of surgery  (USE ADVAIR [place] )  _X___ Stop Metformin and Janumet 2 days prior to surgery. (STOP METFORMIN ON JUNE  26 )    ____ Take 1/2 of usual insulin dose the night before surgery and none on the morning surgery      _x___ Follow recommendations from Cardiologist, Pulmonologist or PCP regarding          stopping Aspirin, Coumadin, Plavix ,Eliquis, Effient, or Pradaxa, and Pletal.  X____Stop Anti-inflammatories such as Advil, Aleve, Ibuprofen, Motrin, Naproxen, Naprosyn, Goodies powders or aspirin products NOW.  OK to take Tylenol    _x___ Stop supplements until after surgery.  But may continue Vitamin D, Vitamin B, and multivitamin          ____ Bring C-Pap to the hospital.

## 2017-01-16 MED ORDER — CEFAZOLIN SODIUM-DEXTROSE 2-4 GM/100ML-% IV SOLN: 2.0000 g | INTRAVENOUS | Status: DC

## 2017-01-17 ENCOUNTER — Ambulatory Visit: Payer: Medicare Other

## 2017-01-17 ENCOUNTER — Observation Stay: Payer: Medicare Other

## 2017-01-17 ENCOUNTER — Observation Stay
Admission: RE | Admit: 2017-01-17 | Discharge: 2017-01-18 | Disposition: A | Discharge status: Home / Self Care | Payer: Medicare Other | Source: Ambulatory Visit | Attending: Orthopedic Surgery | Admitting: Orthopedic Surgery

## 2017-01-17 ENCOUNTER — Ambulatory Visit: Payer: Medicare Other | Admitting: Anesthesiology

## 2017-01-17 ENCOUNTER — Encounter: Payer: Self-pay | Admitting: *Deleted

## 2017-01-17 ENCOUNTER — Encounter
Admission: RE | Disposition: A | Discharge status: Home / Self Care | Payer: Self-pay | Source: Ambulatory Visit | Attending: Orthopedic Surgery

## 2017-01-17 DIAGNOSIS — Z9842 Cataract extraction status, left eye: Secondary | ICD-10-CM | POA: Insufficient documentation

## 2017-01-17 DIAGNOSIS — J45909 Unspecified asthma, uncomplicated: Secondary | ICD-10-CM | POA: Diagnosis not present

## 2017-01-17 DIAGNOSIS — E119 Type 2 diabetes mellitus without complications: Secondary | ICD-10-CM | POA: Diagnosis not present

## 2017-01-17 DIAGNOSIS — Z419 Encounter for procedure for purposes other than remedying health state, unspecified: Secondary | ICD-10-CM

## 2017-01-17 DIAGNOSIS — Z7984 Long term (current) use of oral hypoglycemic drugs: Secondary | ICD-10-CM | POA: Diagnosis not present

## 2017-01-17 DIAGNOSIS — Z825 Family history of asthma and other chronic lower respiratory diseases: Secondary | ICD-10-CM | POA: Insufficient documentation

## 2017-01-17 DIAGNOSIS — Z8249 Family history of ischemic heart disease and other diseases of the circulatory system: Secondary | ICD-10-CM | POA: Insufficient documentation

## 2017-01-17 DIAGNOSIS — R011 Cardiac murmur, unspecified: Secondary | ICD-10-CM | POA: Insufficient documentation

## 2017-01-17 DIAGNOSIS — Z96611 Presence of right artificial shoulder joint: Secondary | ICD-10-CM | POA: Insufficient documentation

## 2017-01-17 DIAGNOSIS — I10 Essential (primary) hypertension: Secondary | ICD-10-CM | POA: Diagnosis not present

## 2017-01-17 DIAGNOSIS — Z90710 Acquired absence of both cervix and uterus: Secondary | ICD-10-CM | POA: Diagnosis not present

## 2017-01-17 DIAGNOSIS — M8580 Other specified disorders of bone density and structure, unspecified site: Secondary | ICD-10-CM | POA: Diagnosis not present

## 2017-01-17 DIAGNOSIS — Z96659 Presence of unspecified artificial knee joint: Secondary | ICD-10-CM | POA: Diagnosis not present

## 2017-01-17 DIAGNOSIS — L0100 Impetigo, unspecified: Secondary | ICD-10-CM | POA: Insufficient documentation

## 2017-01-17 DIAGNOSIS — Z85828 Personal history of other malignant neoplasm of skin: Secondary | ICD-10-CM | POA: Insufficient documentation

## 2017-01-17 DIAGNOSIS — T8484XA Pain due to internal orthopedic prosthetic devices, implants and grafts, initial encounter: Principal | ICD-10-CM | POA: Insufficient documentation

## 2017-01-17 DIAGNOSIS — M25559 Pain in unspecified hip: Secondary | ICD-10-CM

## 2017-01-17 DIAGNOSIS — X58XXXA Exposure to other specified factors, initial encounter: Secondary | ICD-10-CM | POA: Insufficient documentation

## 2017-01-17 DIAGNOSIS — Z9049 Acquired absence of other specified parts of digestive tract: Secondary | ICD-10-CM | POA: Diagnosis not present

## 2017-01-17 DIAGNOSIS — Z7982 Long term (current) use of aspirin: Secondary | ICD-10-CM | POA: Diagnosis not present

## 2017-01-17 DIAGNOSIS — M1990 Unspecified osteoarthritis, unspecified site: Secondary | ICD-10-CM | POA: Diagnosis not present

## 2017-01-17 DIAGNOSIS — Z9889 Other specified postprocedural states: Secondary | ICD-10-CM

## 2017-01-17 DIAGNOSIS — Z96653 Presence of artificial knee joint, bilateral: Secondary | ICD-10-CM | POA: Diagnosis not present

## 2017-01-17 DIAGNOSIS — Z9841 Cataract extraction status, right eye: Secondary | ICD-10-CM | POA: Diagnosis not present

## 2017-01-17 DIAGNOSIS — Z79899 Other long term (current) drug therapy: Secondary | ICD-10-CM | POA: Diagnosis not present

## 2017-01-17 HISTORY — PX: HARDWARE REMOVAL: SHX979

## 2017-01-17 LAB — GLUCOSE, CAPILLARY
GLUCOSE-CAPILLARY: 158 mg/dL — AB (ref 65–99)
GLUCOSE-CAPILLARY: 143 mg/dL — AB (ref 65–99)

## 2017-01-17 SURGERY — REMOVAL, HARDWARE
Anesthesia: General | Site: Hip | Laterality: Left | Wound class: Clean

## 2017-01-17 MED ORDER — MOMETASONE FURO-FORMOTEROL FUM 200-5 MCG/ACT IN AERO: 2.0000 | INHALATION_SPRAY | Freq: Two times a day (BID) | RESPIRATORY_TRACT | Status: DC

## 2017-01-17 MED ORDER — SUCCINYLCHOLINE CHLORIDE 20 MG/ML IJ SOLN
INTRAMUSCULAR | Status: DC | PRN
  Administered 2017-01-17: 80 mg via INTRAVENOUS

## 2017-01-17 MED ORDER — MOMETASONE FURO-FORMOTEROL FUM 200-5 MCG/ACT IN AERO
2.0000 | INHALATION_SPRAY | Freq: Two times a day (BID) | RESPIRATORY_TRACT | Status: DC
  Administered 2017-01-17 – 2017-01-18 (×2): 2 via RESPIRATORY_TRACT
  Filled 2017-01-17: qty 8.8

## 2017-01-17 MED ORDER — ONDANSETRON HCL 4 MG/2ML IJ SOLN
INTRAMUSCULAR | Status: AC
  Filled 2017-01-17: qty 2

## 2017-01-17 MED ORDER — MORPHINE SULFATE (PF) 2 MG/ML IV SOLN: 2.0000 mg | INTRAVENOUS | Status: DC | PRN

## 2017-01-17 MED ORDER — VITAMIN B-12 100 MCG PO TABS
1000.0000 ug | ORAL_TABLET | Freq: Every day | ORAL | Status: DC
  Filled 2017-01-17: qty 10

## 2017-01-17 MED ORDER — SUGAMMADEX SODIUM 200 MG/2ML IV SOLN
INTRAVENOUS | Status: AC
  Filled 2017-01-17: qty 2

## 2017-01-17 MED ORDER — FENTANYL CITRATE (PF) 100 MCG/2ML IJ SOLN
INTRAMUSCULAR | Status: DC | PRN
  Administered 2017-01-17 (×2): 50 ug via INTRAVENOUS

## 2017-01-17 MED ORDER — FAMOTIDINE 20 MG PO TABS
20.0000 mg | ORAL_TABLET | Freq: Once | ORAL | Status: AC
  Administered 2017-01-17: 20 mg via ORAL

## 2017-01-17 MED ORDER — ACETAMINOPHEN 10 MG/ML IV SOLN
INTRAVENOUS | Status: DC | PRN
  Administered 2017-01-17: 1000 mg via INTRAVENOUS

## 2017-01-17 MED ORDER — ONDANSETRON HCL 4 MG PO TABS: 4.0000 mg | ORAL_TABLET | Freq: Four times a day (QID) | ORAL | Status: DC | PRN

## 2017-01-17 MED ORDER — ONDANSETRON HCL 4 MG/2ML IJ SOLN: 4.0000 mg | Freq: Four times a day (QID) | INTRAMUSCULAR | Status: DC | PRN

## 2017-01-17 MED ORDER — PROPOFOL 10 MG/ML IV BOLUS
INTRAVENOUS | Status: DC | PRN
  Administered 2017-01-17: 80 mg via INTRAVENOUS

## 2017-01-17 MED ORDER — SODIUM CHLORIDE 0.9 % IV SOLN
INTRAVENOUS | Status: DC
  Administered 2017-01-17: 25 mL/h via INTRAVENOUS
  Administered 2017-01-17: 11:00:00 via INTRAVENOUS

## 2017-01-17 MED ORDER — DOCUSATE SODIUM 100 MG PO CAPS
100.0000 mg | ORAL_CAPSULE | Freq: Two times a day (BID) | ORAL | Status: DC
  Administered 2017-01-17 – 2017-01-18 (×3): 100 mg via ORAL
  Filled 2017-01-17 (×3): qty 1

## 2017-01-17 MED ORDER — ACETAMINOPHEN 325 MG PO TABS
650.0000 mg | ORAL_TABLET | Freq: Four times a day (QID) | ORAL | Status: DC | PRN
  Administered 2017-01-18: 650 mg via ORAL
  Filled 2017-01-17: qty 2

## 2017-01-17 MED ORDER — MAGNESIUM OXIDE 400 (241.3 MG) MG PO TABS
400.0000 mg | ORAL_TABLET | Freq: Every day | ORAL | Status: DC
  Administered 2017-01-18: 400 mg via ORAL
  Filled 2017-01-17: qty 1

## 2017-01-17 MED ORDER — OXYCODONE HCL 5 MG/5ML PO SOLN: 5.0000 mg | Freq: Once | ORAL | Status: DC | PRN

## 2017-01-17 MED ORDER — POLYETHYLENE GLYCOL 3350 17 G PO PACK: 17.0000 g | PACK | Freq: Every day | ORAL | Status: DC | PRN

## 2017-01-17 MED ORDER — METHOCARBAMOL 1000 MG/10ML IJ SOLN
500.0000 mg | Freq: Four times a day (QID) | INTRAVENOUS | Status: DC | PRN
  Filled 2017-01-17: qty 5

## 2017-01-17 MED ORDER — CEFAZOLIN SODIUM-DEXTROSE 2-3 GM-% IV SOLR
INTRAVENOUS | Status: DC | PRN
  Administered 2017-01-17: 2 g via INTRAVENOUS

## 2017-01-17 MED ORDER — CHLORHEXIDINE GLUCONATE CLOTH 2 % EX PADS: 6.0000 | MEDICATED_PAD | Freq: Once | CUTANEOUS | Status: DC

## 2017-01-17 MED ORDER — SUCCINYLCHOLINE CHLORIDE 20 MG/ML IJ SOLN
INTRAMUSCULAR | Status: AC
  Filled 2017-01-17: qty 1

## 2017-01-17 MED ORDER — VITAMIN B-12 1000 MCG PO TABS
1000.0000 ug | ORAL_TABLET | Freq: Every day | ORAL | Status: DC
  Administered 2017-01-18: 1000 ug via ORAL
  Filled 2017-01-17: qty 1

## 2017-01-17 MED ORDER — FENTANYL CITRATE (PF) 100 MCG/2ML IJ SOLN
25.0000 ug | INTRAMUSCULAR | Status: DC | PRN
  Administered 2017-01-17: 25 ug via INTRAVENOUS
  Administered 2017-01-17: 50 ug via INTRAVENOUS
  Administered 2017-01-17: 25 ug via INTRAVENOUS

## 2017-01-17 MED ORDER — OXYCODONE HCL 5 MG PO TABS: 5.0000 mg | ORAL_TABLET | Freq: Once | ORAL | Status: DC | PRN

## 2017-01-17 MED ORDER — BISACODYL 10 MG RE SUPP: 10.0000 mg | Freq: Every day | RECTAL | Status: DC | PRN

## 2017-01-17 MED ORDER — SEVOFLURANE IN SOLN
RESPIRATORY_TRACT | Status: AC
  Filled 2017-01-17: qty 250

## 2017-01-17 MED ORDER — LISINOPRIL 10 MG PO TABS
10.0000 mg | ORAL_TABLET | Freq: Every day | ORAL | Status: DC
  Administered 2017-01-18: 10 mg via ORAL
  Filled 2017-01-17: qty 1

## 2017-01-17 MED ORDER — METHOCARBAMOL 500 MG PO TABS: 500.0000 mg | ORAL_TABLET | Freq: Four times a day (QID) | ORAL | Status: DC | PRN

## 2017-01-17 MED ORDER — PHENYLEPHRINE HCL 10 MG/ML IJ SOLN
INTRAMUSCULAR | Status: DC | PRN
  Administered 2017-01-17 (×2): 100 ug via INTRAVENOUS

## 2017-01-17 MED ORDER — CEFAZOLIN SODIUM-DEXTROSE 1-4 GM/50ML-% IV SOLN
1.0000 g | Freq: Four times a day (QID) | INTRAVENOUS | Status: AC
  Administered 2017-01-17 – 2017-01-18 (×3): 1 g via INTRAVENOUS
  Filled 2017-01-17 (×3): qty 50

## 2017-01-17 MED ORDER — MAGNESIUM CITRATE PO SOLN
1.0000 | Freq: Once | ORAL | Status: DC | PRN
  Filled 2017-01-17: qty 296

## 2017-01-17 MED ORDER — PROPOFOL 10 MG/ML IV BOLUS
INTRAVENOUS | Status: AC
  Filled 2017-01-17: qty 20

## 2017-01-17 MED ORDER — ALUM & MAG HYDROXIDE-SIMETH 200-200-20 MG/5ML PO SUSP: 30.0000 mL | ORAL | Status: DC | PRN

## 2017-01-17 MED ORDER — MENTHOL 3 MG MT LOZG
1.0000 | LOZENGE | OROMUCOSAL | Status: DC | PRN
  Filled 2017-01-17: qty 9

## 2017-01-17 MED ORDER — DEXAMETHASONE SODIUM PHOSPHATE 10 MG/ML IJ SOLN
INTRAMUSCULAR | Status: DC | PRN
  Administered 2017-01-17: 10 mg via INTRAVENOUS

## 2017-01-17 MED ORDER — ROCURONIUM BROMIDE 100 MG/10ML IV SOLN
INTRAVENOUS | Status: DC | PRN
  Administered 2017-01-17: 15 mg via INTRAVENOUS
  Administered 2017-01-17: 10 mg via INTRAVENOUS
  Administered 2017-01-17: 15 mg via INTRAVENOUS

## 2017-01-17 MED ORDER — OXYCODONE HCL 5 MG PO TABS
5.0000 mg | ORAL_TABLET | ORAL | Status: DC | PRN
  Administered 2017-01-18: 10 mg via ORAL
  Filled 2017-01-17: qty 2

## 2017-01-17 MED ORDER — ACETAMINOPHEN 650 MG RE SUPP: 650.0000 mg | Freq: Four times a day (QID) | RECTAL | Status: DC | PRN

## 2017-01-17 MED ORDER — ADULT MULTIVITAMIN W/MINERALS CH
1.0000 | ORAL_TABLET | Freq: Every day | ORAL | Status: DC
  Administered 2017-01-18: 1 via ORAL
  Filled 2017-01-17: qty 1

## 2017-01-17 MED ORDER — PHENOL 1.4 % MT LIQD
1.0000 | OROMUCOSAL | Status: DC | PRN
  Filled 2017-01-17: qty 177

## 2017-01-17 MED ORDER — SUGAMMADEX SODIUM 200 MG/2ML IV SOLN
INTRAVENOUS | Status: DC | PRN
  Administered 2017-01-17: 149.6 mg via INTRAVENOUS

## 2017-01-17 MED ORDER — ONDANSETRON HCL 4 MG/2ML IJ SOLN
INTRAMUSCULAR | Status: DC | PRN
  Administered 2017-01-17: 4 mg via INTRAVENOUS

## 2017-01-17 MED ORDER — TRAMADOL HCL 50 MG PO TABS
50.0000 mg | ORAL_TABLET | Freq: Every day | ORAL | Status: DC
  Administered 2017-01-17 – 2017-01-18 (×2): 50 mg via ORAL
  Filled 2017-01-17 (×2): qty 1

## 2017-01-17 MED ORDER — LIDOCAINE HCL (CARDIAC) 20 MG/ML IV SOLN
INTRAVENOUS | Status: DC | PRN
  Administered 2017-01-17: 80 mg via INTRAVENOUS

## 2017-01-17 MED ORDER — ASPIRIN EC 325 MG PO TBEC
325.0000 mg | DELAYED_RELEASE_TABLET | Freq: Every day | ORAL | Status: DC
  Administered 2017-01-18: 325 mg via ORAL
  Filled 2017-01-17: qty 1

## 2017-01-17 MED ORDER — NEOMYCIN-POLYMYXIN B GU 40-200000 IR SOLN
Status: DC | PRN
Start: 2017-01-17 — End: 2017-01-17
  Administered 2017-01-17: 2 mL

## 2017-01-17 MED ORDER — FENTANYL CITRATE (PF) 100 MCG/2ML IJ SOLN
INTRAMUSCULAR | Status: AC
  Filled 2017-01-17: qty 2

## 2017-01-17 MED ORDER — SODIUM CHLORIDE 0.9 % IV SOLN
75.0000 mL/h | INTRAVENOUS | Status: DC
  Administered 2017-01-17: 75 mL/h via INTRAVENOUS

## 2017-01-17 MED ORDER — DEXAMETHASONE SODIUM PHOSPHATE 10 MG/ML IJ SOLN
INTRAMUSCULAR | Status: AC
  Filled 2017-01-17: qty 1

## 2017-01-17 MED ORDER — SENNA 8.6 MG PO TABS
1.0000 | ORAL_TABLET | Freq: Two times a day (BID) | ORAL | Status: DC
  Administered 2017-01-17 – 2017-01-18 (×2): 8.6 mg via ORAL
  Filled 2017-01-17 (×2): qty 1

## 2017-01-17 MED ORDER — ACETAMINOPHEN 10 MG/ML IV SOLN
INTRAVENOUS | Status: AC
  Filled 2017-01-17: qty 100

## 2017-01-17 MED ORDER — METFORMIN HCL 500 MG PO TABS
500.0000 mg | ORAL_TABLET | Freq: Two times a day (BID) | ORAL | Status: DC
  Administered 2017-01-18: 500 mg via ORAL
  Filled 2017-01-17 (×3): qty 1

## 2017-01-17 MED ORDER — CEFAZOLIN SODIUM-DEXTROSE 2-4 GM/100ML-% IV SOLN
INTRAVENOUS | Status: AC
  Filled 2017-01-17: qty 100

## 2017-01-17 MED ORDER — TRAZODONE HCL 100 MG PO TABS
100.0000 mg | ORAL_TABLET | Freq: Every day | ORAL | Status: DC
  Administered 2017-01-17: 100 mg via ORAL
  Filled 2017-01-17: qty 1

## 2017-01-17 MED ORDER — ROCURONIUM BROMIDE 50 MG/5ML IV SOLN
INTRAVENOUS | Status: AC
  Filled 2017-01-17: qty 1

## 2017-01-17 MED ORDER — FAMOTIDINE 20 MG PO TABS
ORAL_TABLET | ORAL | Status: AC
  Administered 2017-01-17: 20 mg via ORAL
  Filled 2017-01-17: qty 1

## 2017-01-17 MED ORDER — LIDOCAINE HCL (PF) 2 % IJ SOLN
INTRAMUSCULAR | Status: AC
  Filled 2017-01-17: qty 2

## 2017-01-17 MED ORDER — AMLODIPINE BESYLATE 10 MG PO TABS
10.0000 mg | ORAL_TABLET | Freq: Every day | ORAL | Status: DC
  Administered 2017-01-18: 10 mg via ORAL
  Filled 2017-01-17: qty 1

## 2017-01-17 SURGICAL SUPPLY — 34 items
BNDG COHESIVE 6X5 TAN STRL LF (GAUZE/BANDAGES/DRESSINGS) ×6 IMPLANT
CANISTER SUCT 1200ML W/VALVE (MISCELLANEOUS) ×3 IMPLANT
DRAPE INCISE IOBAN 66X45 STRL (DRAPES) ×6 IMPLANT
DRAPE SHEET LG 3/4 BI-LAMINATE (DRAPES) ×3 IMPLANT
DRAPE STERI IOBAN 125X83 (DRAPES) ×3 IMPLANT
DRAPE SURG 17X11 SM STRL (DRAPES) ×9 IMPLANT
DRAPE U-SHAPE 47X51 STRL (DRAPES) ×3 IMPLANT
DURAPREP 26ML APPLICATOR (WOUND CARE) ×6 IMPLANT
ELECT CAUTERY BLADE 6.4 (BLADE) ×3 IMPLANT
ELECT REM PT RETURN 9FT ADLT (ELECTROSURGICAL) ×3
ELECTRODE REM PT RTRN 9FT ADLT (ELECTROSURGICAL) ×1 IMPLANT
GAUZE SPONGE 4X4 12PLY STRL (GAUZE/BANDAGES/DRESSINGS) ×3 IMPLANT
GLOVE BIOGEL PI IND STRL 9 (GLOVE) ×2 IMPLANT
GLOVE BIOGEL PI INDICATOR 9 (GLOVE) ×4
GLOVE SURG 9.0 ORTHO LTXF (GLOVE) ×3 IMPLANT
GLOVE SURG LX XRAY STRL SZ9 (GLOVE) ×6 IMPLANT
GOWN STRL REUS TWL 2XL XL LVL4 (GOWN DISPOSABLE) ×6 IMPLANT
GOWN STRL REUS W/ TWL LRG LVL3 (GOWN DISPOSABLE) ×2 IMPLANT
GOWN STRL REUS W/TWL LRG LVL3 (GOWN DISPOSABLE) ×4
GUIDEPIN VERSANAIL DSP 3.2X444 (ORTHOPEDIC DISPOSABLE SUPPLIES) ×3 IMPLANT
MAT BLUE FLOOR 46X72 FLO (MISCELLANEOUS) ×3 IMPLANT
NEEDLE FILTER BLUNT 18X 1/2SAF (NEEDLE) ×2
NEEDLE FILTER BLUNT 18X1 1/2 (NEEDLE) ×1 IMPLANT
NS IRRIG 1000ML POUR BTL (IV SOLUTION) ×3 IMPLANT
PACK HIP COMPR (MISCELLANEOUS) ×3 IMPLANT
PENCIL ELECTRO HAND CTR (MISCELLANEOUS) ×3 IMPLANT
STAPLER SKIN PROX 35W (STAPLE) ×3 IMPLANT
STRAP SAFETY BODY (MISCELLANEOUS) ×3 IMPLANT
SUT VIC AB 0 CT1 36 (SUTURE) ×3 IMPLANT
SUT VIC AB 1 CT1 36 (SUTURE) ×3 IMPLANT
SUT VIC AB 2-0 CT1 (SUTURE) ×3 IMPLANT
SUT VICRYL 0 AB UR-6 (SUTURE) ×3 IMPLANT
SYRINGE 10CC LL (SYRINGE) ×3 IMPLANT
TAPE MICROFOAM 4IN (TAPE) ×3 IMPLANT

## 2017-01-17 NOTE — Transfer of Care (Signed)
Immediate Anesthesia Transfer of Care Note  Patient: Leslie Parker  Procedure(s) Performed: Procedure(s): HARDWARE REMOVAL (Left)  Patient Location: PACU  Anesthesia Type:General  Level of Consciousness: awake, alert  and oriented  Airway & Oxygen Therapy: Patient Spontanous Breathing and Patient connected to face mask oxygen  Post-op Assessment: Report given to RN and Post -op Vital signs reviewed and stable  Post vital signs: Reviewed and stable  Last Vitals:  Vitals:   01/17/17 1016  BP: (!) 144/76  Pulse: 86  Resp: 15  Temp: 36.6 C    Last Pain:  Vitals:   01/17/17 1016  TempSrc: Oral  PainSc: 3       Patients Stated Pain Goal: 0 (58/85/02 7741)  Complications: No apparent anesthesia complications

## 2017-01-17 NOTE — Progress Notes (Signed)
Incision near near patient complains of burning sensation and area is tender to touch and swollen.  Ice applied And will notify Dr. Mack Guise.

## 2017-01-17 NOTE — Anesthesia Post-op Follow-up Note (Cosign Needed)
Anesthesia QCDR form completed.        

## 2017-01-17 NOTE — Op Note (Signed)
01/17/2017  12:58 PM  PATIENT:  Leslie Parker    PRE-OPERATIVE DIAGNOSIS:  M25.552 Pain in left hip  POST-OPERATIVE DIAGNOSIS:  Same  PROCEDURE:  HARDWARE REMOVAL LEFT HIP  SURGEON:  Thornton Park, MD  ANESTHESIA:   General  PREOPERATIVE INDICATIONS:  [name] is a  83 y.o. female with a diagnosis of M25.552 Pain in left hip doing intramedullary fixation for left intertrochanteric hip fracture in 2016. Patient has been having increasing pain in the lateral left hip.  Patient was tender directly over the lag screw and is felt the lag screw was the cause of her pain. The decision was made to remove the lag screw for leaving the intramedullary rod in place.    I discussed the risks and benefits of surgery. The risks include but are not limited to infection, bleeding requiring blood transfusion, nerve or blood vessel injury, joint stiffness or loss of motion, persistent pain, weakness or instability, fracture and hardware failure and the need for further surgery. Medical risks include but are not limited to DVT and pulmonary embolism, myocardial infarction, stroke, pneumonia, respiratory failure and death. Patient understood these risks and wished to proceed.   OPERATIVE PROCEDURE: Patient was brought to the operating room. She underwent general endotracheal intubation. Patient was positioned supine on the fracture table.   The left leg was placed in extension and the leg holder. The right leg was placed in hemi-lithotomy position. Patient was prepped and draped in sterile fashion. Timeout was performed to verify the patient's name, date of birth, medical record number, correct site of surgery correct procedure to be performed. The timeout was also used to confirm the patient received antibiotics and that all appropriate instruments and radiographic studies were available in the room. Once all in attendance were in agreement case began.  C-arm images were taken of the left hip. This helped  to localize the position of the lag screw. A lateral incision was made over the lag screw. A deep incision was then made through the IT band. Electrocautery was used to expose the end of the lag screw.  A second incision was made just superior to the tip of the greater trochanter. Again a deep incision was made through the fascia lata. This allowed for palpation of the top of the intramedullary rod. The set screw was then loosened.  The lag screw was then removed using the screwdriver for the lag screw. It came out without difficulty. Post removal fluoroscopy was used to ensure there was no evidence of hip fracture.  The lag screw tract was then copiously irrigated. The incisions were then copiously irrigated. The deep fascia was closed with interrupted 0 Vicryl, the subcutaneous tissue closed with 2-0 Vicryl and the skin approximated with staples. Dry sterile dressings were applied. I was present from the entire case. All sharp and instrument counts were correct at the conclusion the case.  The patient's son after the surgery to let them know patient stable to recovery room in the case was performed without competition.

## 2017-01-17 NOTE — Anesthesia Preprocedure Evaluation (Addendum)
Anesthesia Evaluation  Patient identified by MRN, date of birth, ID band Patient awake    Reviewed: Allergy & Precautions, H&P , NPO status , Patient's Chart, lab work & pertinent test results  History of Anesthesia Complications Negative for: history of anesthetic complications  Airway Mallampati: III  TM Distance: <3 FB Neck ROM: limited    Dental  (+) Poor Dentition, Missing, Upper Dentures, Lower Dentures   Pulmonary shortness of breath and with exertion, asthma , COPD,           Cardiovascular Exercise Tolerance: Good hypertension, (-) angina(-) Past MI and (-) DOE + Valvular Problems/Murmurs      Neuro/Psych negative neurological ROS  negative psych ROS   GI/Hepatic negative GI ROS, Neg liver ROS, neg GERD  ,  Endo/Other  diabetes, Type 2  Renal/GU      Musculoskeletal  (+) Arthritis ,   Abdominal   Peds  Hematology negative hematology ROS (+)   Anesthesia Other Findings Past Medical History: No date: Asthma No date: Cancer ([place])     Comment: Basal Cell No date: Diabetes mellitus without complication (HCC) No date: Heart murmur No date: Hypertension No date: Impetigo No date: Osteoarthritis No date: Osteopenia No date: Wears dentures     Comment: full upper and lower  Past Surgical History: No date: ABDOMINAL HYSTERECTOMY No date: BLADDER SURGERY     Comment: mesh No date: CARPAL TUNNEL RELEASE Bilateral 2017: CATARACT EXTRACTION Right 02/06/2016: CATARACT EXTRACTION W/PHACO Left     Comment: Procedure: CATARACT EXTRACTION PHACO AND               INTRAOCULAR LENS PLACEMENT ([place]) left eye;                Surgeon: Ronnell Freshwater, MD;                Location: [facility];  Service:               Ophthalmology;  Laterality: Left;                DIABETIC LEFT Cannot arrive before 9:30 No date: CERVICAL DISC SURGERY No date: CHOLECYSTECTOMY No date: EYE SURGERY Bilateral  Comment: Cataract Extraction with IOL 02/04/2015: FEMUR IM NAIL Left     Comment: Procedure: INTRAMEDULLARY (IM) NAIL FEMORAL;                Surgeon: Thornton Park, MD;  Location: [facility]               ORS;  Service: Orthopedics;  Laterality: Left; 2014: HERNIA REPAIR     Comment: esophageal and gastric mesh. patient unable to              throw up d/t mesh No date: JOINT REPLACEMENT Bilateral     Comment: knees 2007,2008: REPLACEMENT TOTAL KNEE BILATERAL Bilateral 10/25/2016: SHOULDER ARTHROSCOPY WITH OPEN ROTATOR CUFF RE* Left     Comment: Procedure: SHOULDER ARTHROSCOPY WITH OPEN               ROTATOR CUFF REPAIR AND DISTAL CLAVICLE               ACROMINECTOMY;  Surgeon: Thornton Park, MD;                Location: [facility] ORS;  Service: Orthopedics;                Laterality: Left; No date: THYROID SURGERY     Comment: goiter removed 2012: TOTAL  SHOULDER REPLACEMENT Right  BMI    Body Mass Index:  24.37 kg/m      Reproductive/Obstetrics negative OB ROS                             Anesthesia Physical Anesthesia Plan  ASA: III  Anesthesia Plan: General ETT   Post-op Pain Management:    Induction: Intravenous  PONV Risk Score and Plan: 3 and Ondansetron, Dexamethasone and Propofol  Airway Management Planned: Oral ETT  Additional Equipment:   Intra-op Plan:   Post-operative Plan: Extubation in OR  Informed Consent: I have reviewed the patients History and Physical, chart, labs and discussed the procedure including the risks, benefits and alternatives for the proposed anesthesia with the patient or authorized representative who has indicated his/her understanding and acceptance.   Dental Advisory Given  Plan Discussed with: Anesthesiologist, CRNA and Surgeon  Anesthesia Plan Comments: (Patient consented for risks of anesthesia including but not limited to:  - adverse reactions to medications - damage to teeth, lips or other oral mucosa -  sore throat or hoarseness - Damage to heart, brain, lungs or loss of life  Patient voiced understanding.)       Anesthesia Quick Evaluation

## 2017-01-17 NOTE — H&P (Signed)
PREOPERATIVE H&P  Chief Complaint: M25.552 Pain in left hip  HPI: Leslie Parker is a 83 y.o. female who presents for preoperative history and physical with a diagnosis of M25.552 Pain in left hip. Symptoms are rated as moderate to severe, and have been worsening.  This is significantly impairing activities of daily living. Patient has had a previous intramedullary fixation for left intertrochanteric hip fracture on 02/04/2015 by me.  It seems the etiology behind her left hip pain is a prominent lag screw. I had recommended removal of the lag screw. Patient agreed with this plan. She has elected for surgical management.   Past Medical History:  Diagnosis Date  . Asthma   . Cancer (HCC)    Basal Cell  . Diabetes mellitus without complication ([place])   . Heart murmur   . Hypertension   . Impetigo   . Osteoarthritis   . Osteopenia   . Wears dentures    full upper and lower   Past Surgical History:  Procedure Laterality Date  . ABDOMINAL HYSTERECTOMY    . BLADDER SURGERY     mesh  . CARPAL TUNNEL RELEASE Bilateral   . CATARACT EXTRACTION Right 2017  . CATARACT EXTRACTION W/PHACO Left 02/06/2016   Procedure: CATARACT EXTRACTION PHACO AND INTRAOCULAR LENS PLACEMENT ([place]) left eye;  Surgeon: Ronnell Freshwater, MD;  Location: [facility];  Service: Ophthalmology;  Laterality: Left;  DIABETIC LEFT Cannot arrive before 9:30  . CERVICAL DISC SURGERY    . CHOLECYSTECTOMY    . EYE SURGERY Bilateral    Cataract Extraction with IOL  . FEMUR IM NAIL Left 02/04/2015   Procedure: INTRAMEDULLARY (IM) NAIL FEMORAL;  Surgeon: Thornton Park, MD;  Location: [facility];  Service: Orthopedics;  Laterality: Left;  . HERNIA REPAIR  2014   esophageal and gastric mesh. patient unable to throw up d/t mesh  . JOINT REPLACEMENT Bilateral    knees  . REPLACEMENT TOTAL KNEE BILATERAL Bilateral [id number]  . SHOULDER ARTHROSCOPY WITH OPEN ROTATOR CUFF REPAIR AND DISTAL CLAVICLE ACROMINECTOMY Left  10/25/2016   Procedure: SHOULDER ARTHROSCOPY WITH OPEN ROTATOR CUFF REPAIR AND DISTAL CLAVICLE ACROMINECTOMY;  Surgeon: Thornton Park, MD;  Location: [facility];  Service: Orthopedics;  Laterality: Left;  . THYROID SURGERY     goiter removed  . TOTAL SHOULDER REPLACEMENT Right 2012   Social History   Social History  . Marital status: Divorced    Spouse name: N/A  . Number of children: N/A  . Years of education: N/A   Social History Main Topics  . Smoking status: Never Smoker  . Smokeless tobacco: Never Used  . Alcohol use No  . Drug use: No  . Sexual activity: No   Other Topics Concern  . None   Social History Narrative  . None   Family History  Problem Relation Age of Onset  . Heart failure Mother   . Hypertension Mother   . Emphysema Father    No Known Allergies Prior to Admission medications   Medication Sig Start Date End Date Taking? Authorizing Provider  ADVAIR DISKUS 500-50 MCG/DOSE AEPB Inhale 1 puff into the lungs 2 (two) times daily. 07/18/16  Yes [provider]  amLODipine (NORVASC) 10 MG tablet Take 10 mg by mouth daily. 01/05/14  Yes [provider]  Cyanocobalamin (B-12) 2500 MCG TABS Take 2,500 mcg by mouth daily.   Yes [provider]  lisinopril (PRINIVIL,ZESTRIL) 10 MG tablet Take 10 mg by mouth daily.   Yes [provider]  Magnesium Oxide 500 MG CAPS Take 500 mg by mouth daily.   Yes [provider]  metFORMIN (GLUCOPHAGE) 500 MG tablet Take 1 tablet by mouth 2 (two) times daily. 06/23/14  Yes [provider]  Multiple Vitamin (MULTI-VITAMINS) TABS Take 1 tablet by mouth daily.   Yes [provider]  traMADol (ULTRAM) 50 MG tablet Take 50 mg by mouth daily.   Yes [provider]  traZODone (DESYREL) 100 MG tablet Take 100 mg by mouth at bedtime.   Yes [provider]  Fluticasone-Salmeterol (ADVAIR DISKUS) 500-50 MCG/DOSE AEPB Inhale 1 puff into the lungs 2 (two) times daily.  10/05/14 02/01/16  [provider]  ondansetron (ZOFRAN) 4 MG tablet Take 1 tablet (4 mg total) by mouth every 8 (eight) hours as needed for nausea or vomiting. Patient not taking: Reported on 01/10/2017 10/25/16   Thornton Park, MD  oxyCODONE (OXY IR/ROXICODONE) 5 MG immediate release tablet Take 1-2 tablets (5-10 mg total) by mouth every 4 (four) hours as needed for severe pain. Patient not taking: Reported on 01/10/2017 10/25/16   Thornton Park, MD     Positive ROS: All other systems have been reviewed and were otherwise negative with the exception of those mentioned in the HPI and as above.  Physical Exam: General: Alert, no acute distress Cardiovascular: Regular rate and rhythm, no murmurs rubs or gallops.  No pedal edema Respiratory: Clear to auscultation bilaterally, no wheezes rales or rhonchi. No cyanosis, no use of accessory musculature GI: No organomegaly, abdomen is soft and non-tender nondistended with positive bowel sounds. Skin: Skin intact, no lesions within the operative field. Neurologic: Sensation intact distally Psychiatric: Patient is competent for consent with normal mood and affect Lymphatic: No cervical lymphadenopathy  MUSCULOSKELETAL: Left lower extremity: Patient's skin is intact. She has point tenderness over the lateral hip in the area of the lag screw. Patient distally his neurovascular intact with palpable pedal pulses, intact motor function and intact sensation light touch.  Assessment: M25.552 Pain in left hip  Plan: Plan for Procedure(s): LEFT HIP HARDWARE REMOVAL (LAG SCREW)  I described the details of the procedure to the patient. She understands we are only removing the lag screw but reaming the intramedullary rod and distal interlocking screw.  I discussed the risks and benefits of surgery. The risks include but are not limited to infection, bleeding r, nerve or blood vessel injury, joint stiffness or loss of motion, persistent pain,  weakness or instability, refracture, and hardware failure and the need for further surgery. Medical risks include but are not limited to DVT and pulmonary embolism, myocardial infarction, stroke, pneumonia, respiratory failure and death. Patient understood these risks and wished to proceed.     Thornton Park, MD   01/17/2017 10:55 AM

## 2017-01-17 NOTE — Progress Notes (Signed)
Dr. Mack Guise at bedside, to apply ace wrap to area For pressure.

## 2017-01-17 NOTE — Progress Notes (Signed)
Called floor to see if room ready, RN at lunch, asked [place] to hold patient for approx. 30 minutes.

## 2017-01-17 NOTE — Anesthesia Procedure Notes (Signed)
Procedure Name: Intubation Date/Time: 01/17/2017 11:41 AM Performed by: Andria Frames Pre-anesthesia Checklist: Patient identified, Patient being monitored, Timeout performed, Emergency Drugs available and Suction available Patient Re-evaluated:Patient Re-evaluated prior to inductionOxygen Delivery Method: Circle system utilized Preoxygenation: Pre-oxygenation with 100% oxygen Intubation Type: IV induction Ventilation: Mask ventilation without difficulty Laryngoscope Size: Mac and 3 Grade View: Grade I Tube type: Oral Tube size: 7.0 mm Number of attempts: 1 Airway Equipment and Method: Stylet Placement Confirmation: ETT inserted through vocal cords under direct vision,  positive ETCO2 and breath sounds checked- equal and bilateral Secured at: 21 cm Tube secured with: Tape Dental Injury: Teeth and Oropharynx as per pre-operative assessment

## 2017-01-17 NOTE — Progress Notes (Signed)
Called back to floor, 15 minute call given.

## 2017-01-17 NOTE — Anesthesia Postprocedure Evaluation (Signed)
Anesthesia Post Note  Patient: SHELVY HECKERT  Procedure(s) Performed: Procedure(s) (LRB): HARDWARE REMOVAL (Left)  Patient location during evaluation: PACU Anesthesia Type: General Level of consciousness: awake and alert Pain management: pain level controlled Vital Signs Assessment: post-procedure vital signs reviewed and stable Respiratory status: spontaneous breathing, nonlabored ventilation, respiratory function stable and patient connected to nasal cannula oxygen Cardiovascular status: blood pressure returned to baseline and stable Postop Assessment: no signs of nausea or vomiting Anesthetic complications: no     Last Vitals:  Vitals:   01/17/17 1410 01/17/17 1432  BP:  (!) 146/71  Pulse: 78 80  Resp: 13 16  Temp:  36.6 C    Last Pain:  Vitals:   01/17/17 1410  TempSrc:   PainSc: 5                  Precious Haws Piscitello

## 2017-01-17 NOTE — Progress Notes (Signed)
  Subjective:  POST-OP CHECK:  Patient reports left hip/thigh pain as mild at rest.  Her son is at the bedside.  Objective:   VITALS:   Vitals:   01/17/17 1420 01/17/17 1432 01/17/17 1613 01/17/17 1709  BP: 138/65 (!) 146/71 (!) 138/55 (!) 138/57  Pulse: 78 80 78 80  Resp: 12 16 18 16   Temp:  97.9 F (36.6 C) 97.9 F (36.6 C) 97.9 F (36.6 C)  TempSrc:   Oral Oral  SpO2: 98% 97% 98% 98%  Weight:      Height:        PHYSICAL EXAM:  Left lower extremity: Patient has a small hematoma over the site of the lag screw removal.  Her thigh compartments are soft and compressible. She has intact sensation light touch and palpable pedal pulses. She has intact motor function distally.  Patient has some sanguinous drainage on her dressing.  Her hip spica dressing is in place.   LABS  Results for orders placed or performed during the hospital encounter of 01/17/17 (from the past 24 hour(s))  Glucose, capillary     Status: Abnormal   Collection Time: 01/17/17 10:12 AM  Result Value Ref Range   Glucose-Capillary 158 (H) 65 - 99 mg/dL  Glucose, capillary     Status: Abnormal   Collection Time: 01/17/17 12:36 PM  Result Value Ref Range   Glucose-Capillary 143 (H) 65 - 99 mg/dL    Dg Hip Port Unilat With Pelvis 1v Left  Result Date: 01/17/2017 CLINICAL DATA:  Postop left hip EXAM: DG HIP (WITH OR WITHOUT PELVIS) 1V PORT LEFT COMPARISON:  February 04, 2015 FINDINGS: The gamma nail extending through the left femoral neck has been removed. An intramedullary rod remains in the femur. A distal interlocking screw is identified. The patient is status post knee replacement. No evidence of hardware failure. No other acute abnormalities. IMPRESSION: Postsurgical changes as above. Electronically Signed   By: Dorise Bullion III M.D   On: 01/17/2017 13:59    Assessment/Plan: Day of Surgery   Active Problems:   S/P hardware removal  Patient doing well postop. I reviewed the postop x-rays which  demonstrate the lag screw is removed in its entirety without complication. There is no evidence of fracture. Patient is feeling well postop. She will be 50% partial weightbearing on the left lower extremity with a walker. Physical therapy has been ordered for the morning.  Patient will receive IV antibiotics overnight. Continue current pain management. Patient will likely be discharged home tomorrow.    Thornton Park , MD 01/17/2017, 6:47 PM

## 2017-01-17 NOTE — Progress Notes (Signed)
Patient admitted to the unit from PACU. Patient alert and oriented X4. Patient oriented to the room and call bell system. Vital signs stable at this time. Patient reports a pain of 4 out of 10. Pt denies the need for pain meds. Skin assessment and admission completed.  Wynema Birch, RN

## 2017-01-18 DIAGNOSIS — T8484XA Pain due to internal orthopedic prosthetic devices, implants and grafts, initial encounter: Secondary | ICD-10-CM | POA: Diagnosis not present

## 2017-01-18 LAB — CBC
HEMATOCRIT: 34.2 % — AB (ref 35.0–47.0)
HEMOGLOBIN: 11.9 g/dL — AB (ref 12.0–16.0)
MCH: 31 pg (ref 26.0–34.0)
MCHC: 34.8 g/dL (ref 32.0–36.0)
MCV: 89.1 fL (ref 80.0–100.0)
Platelets: 226 10*3/uL (ref 150–440)
RBC: 3.84 MIL/uL (ref 3.80–5.20)
RDW: 13.4 % (ref 11.5–14.5)
WBC: 8.2 10*3/uL (ref 3.6–11.0)

## 2017-01-18 LAB — BASIC METABOLIC PANEL
ANION GAP: 6 (ref 5–15)
BUN: 37 mg/dL — ABNORMAL HIGH (ref 6–20)
CALCIUM: 8.6 mg/dL — AB (ref 8.9–10.3)
CO2: 22 mmol/L (ref 22–32)
Chloride: 103 mmol/L (ref 101–111)
Creatinine, Ser: 1.35 mg/dL — ABNORMAL HIGH (ref 0.44–1.00)
GFR calc non Af Amer: 35 mL/min — ABNORMAL LOW (ref >60)
GFR, EST AFRICAN AMERICAN: 41 mL/min — AB (ref >60)
GLUCOSE: 361 mg/dL — AB (ref 65–99)
POTASSIUM: 4.6 mmol/L (ref 3.5–5.1)
Sodium: 131 mmol/L — ABNORMAL LOW (ref 135–145)

## 2017-01-18 MED ORDER — OXYCODONE HCL 5 MG PO TABS: 5.0000 mg | ORAL_TABLET | ORAL | 0 refills | Status: DC | PRN

## 2017-01-18 MED ORDER — ASPIRIN 325 MG PO TABS
325.0000 mg | ORAL_TABLET | Freq: Once | ORAL | Status: AC
  Administered 2017-01-18: 325 mg via ORAL
  Filled 2017-01-18: qty 1

## 2017-01-18 MED ORDER — ASPIRIN EC 325 MG PO TBEC: 325.0000 mg | DELAYED_RELEASE_TABLET | Freq: Every day | ORAL | 0 refills | Status: DC

## 2017-01-18 NOTE — Care Management Obs Status (Signed)
[place] NOTIFICATION   Patient Details  Name: Leslie Parker MRN: [id number] Date of Birth: 23-Dec-1932   Medicare Observation Status Notification Given:  Yes    Jolly Mango, RN 01/18/2017, 8:54 AM

## 2017-01-18 NOTE — Care Management Note (Signed)
Case Management Note  Patient Details  Name: Leslie Parker MRN: [id number] Date of Birth: 1933-03-01  Subjective/Objective:  RNCM consult for discharge planning. Met with patient at bedside to dsiucss discharge planning. Patient lives at home alone in a senior citizen apartment. She is normally independent but does have a walker at home. She has been going to physical therapy at [facility] 2 times a week since April for her shoulder. She will continue PT at [facility] for her hip after discharge. Will inquire with orthopedist if patient will need Lovenox at DC. It is anticipated patient will discharge today.                  Action/Plan: OP PT.   Expected Discharge Date:  01/18/17               Expected Discharge Plan:  OP Rehab  In-House Referral:     Discharge planning Services  CM Consult  Post Acute Care Choice:    Choice offered to:  Patient  DME Arranged:    DME Agency:     HH Arranged:    [place] Agency:     Status of Service:  Completed, signed off  If discussed at [employer] of Stay Meetings, dates discussed:    Additional Comments:  Jolly Mango, RN 01/18/2017, 8:55 AM

## 2017-01-18 NOTE — Care Management (Signed)
Case discussed with dr. Cindi Carbon. He does not want patient on Lovenox. She will discharge on ASA

## 2017-01-18 NOTE — Care Management (Signed)
Patient is not a bundle patient. Verified with bundle payment department.

## 2017-01-18 NOTE — Progress Notes (Signed)
Physical Therapy Treatment Patient Details Name: Leslie Parker MRN: [id number] DOB: [birth year] Today's Date: 01/18/2017    History of Present Illness Pt is an 83 y.o. female presenting with hip pain x2 years.  Pt s/p L hip HWR (lag screw removed without complication) 1/54/00.  PMH includes DM, htn, impetigo, asthma, basal cell CA, CTR, IMN 7/16, B TKR, R TSR, and L rotator cuff repair.    PT Comments    Pt able to progress to ambulating 120 feet with RW modified independently.  Pt appeared to maintain PWB'ing status well with functional mobility (using RW) without any vc's required.  Pain 2/10 throughout session for L hip.  RW delivered to pt's room and fit appropriately for pt's height.  Plan for discharge today with OP PT.    Follow Up Recommendations  Outpatient PT     Equipment Recommendations  Rolling walker with 5" wheels    Recommendations for Other Services       Precautions / Restrictions Precautions Precautions: Fall Restrictions Weight Bearing Restrictions: Yes LLE Weight Bearing: Partial weight bearing LLE Partial Weight Bearing Percentage or Pounds: 50%    Mobility  Bed Mobility             General bed mobility comments: Deferred d/t pt up in chair (pt modified independent supine to/from sit this morning)  Transfers Overall transfer level: Modified independent Equipment used: Rolling walker (2 wheeled) Transfers: Sit to/from Stand Sit to Stand: Modified independent (Device/Increase time)         General transfer comment: steady with transfers using RW  Ambulation/Gait Ambulation/Gait assistance: Modified independent (Device/Increase time) Ambulation Distance (Feet): 120 Feet Assistive device: Rolling walker (2 wheeled) Gait Pattern/deviations: Step-to pattern Gait velocity: mildly decreased   General Gait Details: no vc's required for PWB'ing status; appropriate UE support through RW and step to pattern   Stairs Stairs:  (Deferred d/t pt does  not have stairs at home)          Wheelchair Mobility    Modified Rankin (Stroke Patients Only)       Balance Overall balance assessment: Needs assistance Sitting-balance support: No upper extremity supported;Feet supported Sitting balance-[name]: Normal Sitting balance - Comments: sitting reaching outside BOS   Standing balance support: Single extremity supported Standing balance-[name]: Good Standing balance comment: standing reaching within BOS                            Cognition Arousal/Alertness: Awake/alert Behavior During Therapy: WFL for tasks assessed/performed Overall Cognitive Status: Within Functional Limits for tasks assessed                                        Exercises     General Comments General comments (skin integrity, edema, etc.): L hip bandage in place.  Nursing cleared pt for participation in physical therapy.  Pt agreeable to PT session.      Pertinent Vitals/Pain Pain Assessment: 0-10 Pain Score: 2  Pain Location: L hip Pain Descriptors / Indicators: Sore Pain Intervention(s): Limited activity within patient's tolerance;Monitored during session;Premedicated before session;Repositioned  Vitals (HR and O2 on room air) stable and WFL throughout treatment session.    Home Living Family/patient expects to be discharged to:: Private residence Living Arrangements: Alone   Type of Home: Apartment Home Access: Level entry   Home Layout: One level Home  Equipment: Gilford Rile - 4 wheels;Cane - single point;Shower seat;Grab bars - toilet;Grab bars - tub/shower      Prior Function Level of Independence: Independent      Comments: Pt denies any falls in past 6 months.   PT Goals (current goals can now be found in the care plan section) Acute Rehab PT Goals Patient Stated Goal: to go home PT Goal Formulation: With patient Time For Goal Achievement: 02/01/17 Potential to Achieve Goals: Good Progress  towards PT goals: Progressing toward goals    Frequency    BID      PT Plan Current plan remains appropriate    Co-evaluation              AM-PAC PT "6 Clicks" Daily Activity  Outcome Measure  Difficulty turning over in bed (including adjusting bedclothes, sheets and blankets)?: A Little Difficulty moving from lying on back to sitting on the side of the bed? : A Little Difficulty sitting down on and standing up from a chair with arms (e.g., wheelchair, bedside commode, etc,.)?: None Help needed moving to and from a bed to chair (including a wheelchair)?: None Help needed walking in hospital room?: None Help needed climbing 3-5 steps with a railing? : A Lot 6 Click Score: 20    End of Session Equipment Utilized During Treatment: Gait belt Activity Tolerance: Patient tolerated treatment well Patient left: in chair;with call bell/phone within reach;with chair alarm set;with SCD's reapplied (B heels elevated via pillow) Nurse Communication: Mobility status;Precautions;Weight bearing status PT Visit Diagnosis: Difficulty in walking, not elsewhere classified (R26.2);Pain;Muscle weakness (generalized) (M62.81) Pain - Right/Left: Left Pain - part of body: Hip     Time: 1310-1320 PT Time Calculation (min) (ACUTE ONLY): 10 min  Charges:  $Therapeutic Exercise: 8-22 mins $Therapeutic Activity: 8-22 mins                    G Codes:  Functional Assessment Tool Used: AM-PAC 6 Clicks Basic Mobility Functional Limitation: Mobility: Walking and moving around Mobility: Walking and Moving Around Current Status (H1505): At least 40 percent but less than 60 percent impaired, limited or restricted Mobility: Walking and Moving Around Goal Status [phone]): 0 percent impaired, limited or restricted    Leitha Bleak, PT 01/18/17, 1:32 PM [phone]

## 2017-01-18 NOTE — Progress Notes (Signed)
  Subjective:  POD #1  S/p Patient reports left hip pain as mild.  Patient is up out of bed to a chair. She states she did well with physical therapy this morning. She was using her walker and partial weightbearing. She states that she had no pain when performing physical therapy.  Objective:   VITALS:   Vitals:   01/17/17 1957 01/17/17 2333 01/18/17 0547 01/18/17 0756  BP: 130/66 131/65 (!) 141/65 126/77  Pulse: 86 82 76 79  Resp: 18 18 16 16   Temp: 97.7 F (36.5 C) 98.3 F (36.8 C) 98.3 F (36.8 C) 98.8 F (37.1 C)  TempSrc: Oral Oral Oral Oral  SpO2: 96% 97% 96% 95%  Weight:      Height:        PHYSICAL EXAM:  Left lower extremity: Neurovascular intact Sensation intact distally Intact pulses distally Dorsiflexion/Plantar flexion intact Incision: moderate drainage No cellulitis present Compartment soft  LABS  Results for orders placed or performed during the hospital encounter of 01/17/17 (from the past 24 hour(s))  CBC     Status: Abnormal   Collection Time: 01/18/17  5:17 AM  Result Value Ref Range   WBC 8.2 3.6 - 11.0 K/uL   RBC 3.84 3.80 - 5.20 MIL/uL   Hemoglobin 11.9 (L) 12.0 - 16.0 g/dL   HCT 34.2 (L) 35.0 - 47.0 %   MCV 89.1 80.0 - 100.0 fL   MCH 31.0 26.0 - 34.0 pg   MCHC 34.8 32.0 - 36.0 g/dL   RDW 13.4 11.5 - 14.5 %   Platelets 226 150 - 440 K/uL  Basic metabolic panel     Status: Abnormal   Collection Time: 01/18/17  5:17 AM  Result Value Ref Range   Sodium 131 (L) 135 - 145 mmol/L   Potassium 4.6 3.5 - 5.1 mmol/L   Chloride 103 101 - 111 mmol/L   CO2 22 22 - 32 mmol/L   Glucose, Bld 361 (H) 65 - 99 mg/dL   BUN 37 (H) 6 - 20 mg/dL   Creatinine, Ser 1.35 (H) 0.44 - 1.00 mg/dL   Calcium 8.6 (L) 8.9 - 10.3 mg/dL   GFR calc non Af Amer 35 (L) >60 mL/min   GFR calc Af Amer 41 (L) >60 mL/min   Anion gap 6 5 - 15    Dg Hip Port Unilat With Pelvis 1v Left  Result Date: 01/17/2017 CLINICAL DATA:  Postop left hip EXAM: DG HIP (WITH OR WITHOUT  PELVIS) 1V PORT LEFT COMPARISON:  February 04, 2015 FINDINGS: The gamma nail extending through the left femoral neck has been removed. An intramedullary rod remains in the femur. A distal interlocking screw is identified. The patient is status post knee replacement. No evidence of hardware failure. No other acute abnormalities. IMPRESSION: Postsurgical changes as above. Electronically Signed   By: Dorise Bullion III M.D   On: 01/17/2017 13:59    Assessment/Plan: 1 Day Post-Op   Active Problems:   S/P hardware removal  Patient is doing well postop. She is completed 24 hours of postop antibiotics. She is making progress with physical therapy and is ready for discharge home today.  She'll perform physical therapy as an outpatient.  She is due to follow up with me in the office on July 11.    Thornton Park , MD 01/18/2017, 12:45 PM

## 2017-01-18 NOTE — Evaluation (Signed)
Physical Therapy Evaluation Patient Details Name: Leslie Parker MRN: [id number] DOB: [birth year] Today's Date: 01/18/2017   History of Present Illness  Pt is an 83 y.o. female presenting with hip pain x2 years.  Pt s/p L hip HWR (lag screw removed without complication) 01/29/61.  PMH includes DM, htn, impetigo, asthma, basal cell CA, CTR, IMN 7/16, B TKR, R TSR, and L rotator cuff repair.  Clinical Impression  Prior to hospital admission, pt was independent with functional mobility.  Pt lives alone in 1 bedroom apt (level entry).  Currently pt is modified independent with bed mobility, SBA with transfers, and CGA with ambulation 80 feet with RW.  L hip pain 3-4/10 during session.  Pt requiring initial vc's and demo for [place] status with ambulation but then pt appeared to maintain [place] precautions.  Pt would benefit from skilled PT to address noted impairments and functional limitations (see below for any additional details).  Upon hospital discharge, recommend pt discharge to home with OP PT.    Follow Up Recommendations Outpatient PT    Equipment Recommendations  Rolling walker with 5" wheels    Recommendations for Other Services       Precautions / Restrictions Precautions Precautions: Fall Restrictions Weight Bearing Restrictions: Yes LLE Weight Bearing: Partial weight bearing LLE Partial Weight Bearing Percentage or Pounds: 50%      Mobility  Bed Mobility Overal bed mobility: Modified Independent             General bed mobility comments: Supine to/from sit with bed flat mild increased effort   Transfers Overall transfer level: Needs assistance Equipment used: Rolling walker (2 wheeled) Transfers: Sit to/from Stand Sit to Stand: Supervision         General transfer comment: steady with transfers using RW  Ambulation/Gait Ambulation/Gait assistance: Min guard;Supervision Ambulation Distance (Feet): 80 Feet Assistive device: Rolling walker (2 wheeled) Gait  Pattern/deviations: Step-to pattern Gait velocity: decreased   General Gait Details: initial vc's and demo for increasing UE support through RW and step to pattern to maintain PWB'ing status  Stairs            Wheelchair Mobility    Modified Rankin (Stroke Patients Only)       Balance Overall balance assessment: Needs assistance Sitting-balance support: No upper extremity supported;Feet supported Sitting balance-[name]: Good Sitting balance - Comments: sitting reaching within BOS   Standing balance support: Single extremity supported Standing balance-[name]: Good Standing balance comment: standing reaching within BOS                             Pertinent Vitals/Pain Pain Assessment: 0-10 Pain Score: 4  Pain Location: L hip Pain Descriptors / Indicators: Sore Pain Intervention(s): Limited activity within patient's tolerance;Monitored during session;Premedicated before session;Repositioned  Vitals (HR and O2 on room air) stable and WFL throughout treatment session.    Home Living Family/patient expects to be discharged to:: Private residence Living Arrangements: Alone   Type of Home: Apartment Home Access: Level entry     Home Layout: One [place]: Nadine - 4 wheels;Cane - single point;Shower seat;Grab bars - toilet;Grab bars - tub/shower      Prior Function Level of Independence: Independent         Comments: Pt denies any falls in past 6 months.     Hand Dominance        Extremity/Trunk Assessment   Upper Extremity Assessment Upper Extremity Assessment: Generalized  weakness    Lower Extremity Assessment Lower Extremity Assessment: Generalized weakness (at least 3/5 L hip flexion, knee flexion/extension, and DF)    Cervical / Trunk Assessment Cervical / Trunk Assessment: Normal  Communication   Communication: No difficulties  Cognition Arousal/Alertness: Awake/alert Behavior During Therapy: WFL for tasks  assessed/performed Overall Cognitive Status: Within Functional Limits for tasks assessed                                        General Comments General comments (skin integrity, edema, etc.): L hip bandage in place; no drainage noted.  Pt agreeable to PT session.    Exercises Total Joint Exercises Ankle Circles/Pumps: AROM;Strengthening;Both;10 reps;Supine Quad Sets: AROM;Strengthening;Both;10 reps;Supine Short Arc Quad: AROM;Strengthening;Left;10 reps;Supine Heel Slides: AROM;Strengthening;Left;10 reps;Supine Hip ABduction/ADduction: AROM;Strengthening;Left;10 reps;Supine   Assessment/Plan    PT Assessment Patient needs continued PT services  PT Problem List Decreased strength;Decreased activity tolerance;Decreased mobility;Decreased knowledge of use of DME;Decreased knowledge of precautions;Pain       PT Treatment Interventions DME instruction;Gait training;Functional mobility training;Therapeutic activities;Therapeutic exercise;Balance training;Patient/family education    PT Goals (Current goals can be found in the Care Plan section)  Acute Rehab PT Goals Patient Stated Goal: to go home PT Goal Formulation: With patient Time For Goal Achievement: 02/01/17 Potential to Achieve Goals: Good    Frequency BID   Barriers to discharge        Co-evaluation               AM-PAC PT "6 Clicks" Daily Activity  Outcome Measure Difficulty turning over in bed (including adjusting bedclothes, sheets and blankets)?: A Little Difficulty moving from lying on back to sitting on the side of the bed? : A Little Difficulty sitting down on and standing up from a chair with arms (e.g., wheelchair, bedside commode, etc,.)?: A Little Help needed moving to and from a bed to chair (including a wheelchair)?: A Little Help needed walking in hospital room?: A Little Help needed climbing 3-5 steps with a railing? : A Lot 6 Click Score: 17    End of Session Equipment  Utilized During Treatment: Gait belt Activity Tolerance: Patient tolerated treatment well Patient left: in chair;with call bell/phone within reach;with chair alarm set;with SCD's reapplied (B heels elevated via pillow) Nurse Communication: Mobility status;Precautions;Weight bearing status PT Visit Diagnosis: Difficulty in walking, not elsewhere classified (R26.2);Pain;Muscle weakness (generalized) (M62.81) Pain - Right/Left: Left Pain - part of body: Hip    Time: 1540-0867 PT Time Calculation (min) (ACUTE ONLY): 31 min   Charges:   PT Evaluation $PT Eval Low Complexity: 1 Procedure PT Treatments $Therapeutic Exercise: 8-22 mins   PT G Codes:   PT G-Codes **NOT FOR INPATIENT CLASS** Functional Assessment Tool Used: AM-PAC 6 Clicks Basic Mobility Functional Limitation: Mobility: Walking and moving around Mobility: Walking and Moving Around Current Status (Y1950): At least 40 percent but less than 60 percent impaired, limited or restricted Mobility: Walking and Moving Around Goal Status [phone]): 0 percent impaired, limited or restricted    Leitha Bleak, PT 01/18/17, 10:14 AM [phone]

## 2017-01-18 NOTE — Discharge Summary (Signed)
Physician Discharge Summary  Patient ID: Leslie Parker MRN: [id number] DOB/AGE: Dec 20, 1932 83 y.o.  Admit date: 01/17/2017 Discharge date: 01/18/2017  Admission Diagnoses:  M25.552 Pain in left hip <principal problem not specified>  Discharge Diagnoses:  M25.552 Pain in left hip Active Problems:   S/P hardware removal left hip   Past Medical History:  Diagnosis Date  . Asthma   . Cancer (HCC)    Basal Cell  . Diabetes mellitus without complication ([place])   . Heart murmur   . Hypertension   . Impetigo   . Osteoarthritis   . Osteopenia   . Wears dentures    full upper and lower    Surgeries: Procedure(s): HARDWARE REMOVAL on 01/17/2017   Consultants (if any):   Discharged Condition: Improved  Hospital Course: Leslie Parker is an 83 y.o. female who was admitted 01/17/2017 with a diagnosis of  M25.552 Pain in left hip  and went to the operating room on 01/17/2017 and underwent an uncomplicated hardware removal of the left hip lag screw.  She was admitted orthopedic floor postoperatively for pain control and neurovascular monitoring as well as IV antibiotics. She made excellent progress overnight. Given her clinical improvement she is prepared for discharge home.    She was given perioperative antibiotics:  Anti-infectives    Start     Dose/Rate Route Frequency Ordered Stop   01/17/17 1300  ceFAZolin (ANCEF) IVPB 1 g/50 mL premix     1 g 100 mL/hr over 30 Minutes Intravenous Every 6 hours 01/17/17 1257 01/18/17 0256   01/17/17 1003  ceFAZolin (ANCEF) 2-4 GM/100ML-% IVPB    Comments:  Cleatis Polka   : cabinet override      01/17/17 1003 01/17/17 2214   01/16/17 0044  ceFAZolin (ANCEF) IVPB 2g/100 mL premix  Status:  Discontinued     2 g 200 mL/hr over 30 Minutes Intravenous On call to O.R. 01/16/17 8841 01/17/17 0559    .  She was given sequential compression devices, early ambulation, and ECASA for DVT prophylaxis.  She benefited maximally from the hospital stay and  there were no complications.    Recent vital signs:  Vitals:   01/18/17 0547 01/18/17 0756  BP: (!) 141/65 126/77  Pulse: 76 79  Resp: 16 16  Temp: 98.3 F (36.8 C) 98.8 F (37.1 C)    Recent laboratory studies:  Lab Results  Component Value Date   HGB 11.9 (L) 01/18/2017   HGB 14.1 01/14/2017   HGB 13.9 10/16/2016   Lab Results  Component Value Date   WBC 8.2 01/18/2017   PLT 226 01/18/2017   Lab Results  Component Value Date   INR 0.94 01/14/2017   Lab Results  Component Value Date   NA 131 (L) 01/18/2017   K 4.6 01/18/2017   CL 103 01/18/2017   CO2 22 01/18/2017   BUN 37 (H) 01/18/2017   CREATININE 1.35 (H) 01/18/2017   GLUCOSE 361 (H) 01/18/2017    Discharge Medications:   Allergies as of 01/18/2017   No Known Allergies     Medication List    TAKE these medications   ADVAIR DISKUS 500-50 MCG/DOSE Aepb Generic drug:  Fluticasone-Salmeterol Inhale 1 puff into the lungs 2 (two) times daily.   ADVAIR DISKUS 500-50 MCG/DOSE Aepb Generic drug:  Fluticasone-Salmeterol Inhale 1 puff into the lungs 2 (two) times daily.   aspirin EC 325 MG tablet Take 1 tablet (325 mg total) by mouth daily.   B-12 2500 MCG Tabs  Take 2,500 mcg by mouth daily.   lisinopril 10 MG tablet Commonly known as:  PRINIVIL,ZESTRIL Take 10 mg by mouth daily.   Magnesium Oxide 500 MG Caps Take 500 mg by mouth daily.   metFORMIN 500 MG tablet Commonly known as:  GLUCOPHAGE Take 1 tablet by mouth 2 (two) times daily.   MULTI-VITAMINS Tabs Take 1 tablet by mouth daily.   NORVASC 10 MG tablet Generic drug:  amLODipine Take 10 mg by mouth daily.   ondansetron 4 MG tablet Commonly known as:  ZOFRAN Take 1 tablet (4 mg total) by mouth every 8 (eight) hours as needed for nausea or vomiting.   oxyCODONE 5 MG immediate release tablet Commonly known as:  Oxy IR/ROXICODONE Take 1-2 tablets (5-10 mg total) by mouth every 4 (four) hours as needed for breakthrough pain ((for  MODERATE breakthrough pain)). What changed:  reasons to take this   traMADol 50 MG tablet Commonly known as:  ULTRAM Take 50 mg by mouth daily.   traZODone 100 MG tablet Commonly known as:  DESYREL Take 100 mg by mouth at bedtime.            Durable Medical Equipment        Start     Ordered   01/18/17 1111  For home use only DME Walker rolling  Once    Question:  Patient needs a walker to treat with the following condition  Answer:  Weakness   01/18/17 1111      Diagnostic Studies: Ct Hip Left Wo Contrast  Result Date: 01/04/2017 CLINICAL DATA:  Worsening left hip pain for 2 years. EXAM: CT OF THE LEFT HIP WITHOUT CONTRAST TECHNIQUE: Multidetector CT imaging of the left hip was performed according to the standard protocol. Multiplanar CT image reconstructions were also generated. COMPARISON:  Radiograph 02/04/2015 FINDINGS: Long intramedullary rod in the left femur with a proximal dynamic hip screw and a distal interlocking screw. No complicating features are identified. There is a healed intertrochanteric fracture. Focal area of heterotopic ossification noted superior to the greater trochanter. No findings for acute fracture or AVN. The visualized left hemipelvis is intact. The left SI joint and pubic symphysis are unremarkable. Mild degenerative changes. No acetabular fracture. The surrounding hip and pelvic musculature appear grossly normal. No obvious muscle tear, hematoma or mass. Moderate vascular calcifications are noted. No significant intrapelvic abnormalities are identified. IMPRESSION: Intact left hip/ femur hardware without complicating features. No hip fracture or significant hip joint degenerative changes. No AVN. The visualized left hemipelvis is intact. Electronically Signed   By: Marijo Sanes M.D.   On: 01/04/2017 18:17   Dg Hip Port Unilat With Pelvis 1v Left  Result Date: 01/17/2017 CLINICAL DATA:  Postop left hip EXAM: DG HIP (WITH OR WITHOUT PELVIS) 1V PORT  LEFT COMPARISON:  February 04, 2015 FINDINGS: The gamma nail extending through the left femoral neck has been removed. An intramedullary rod remains in the femur. A distal interlocking screw is identified. The patient is status post knee replacement. No evidence of hardware failure. No other acute abnormalities. IMPRESSION: Postsurgical changes as above. Electronically Signed   By: Dorise Bullion III M.D   On: 01/17/2017 13:59    Disposition: 01-Home or Self Care  Discharge Instructions    Call MD / Call 911    Complete by:  As directed    If you experience chest pain or shortness of breath, CALL 911 and be transported to the hospital emergency room.  If you develope  a fever above 101 F, pus (white drainage) or increased drainage or redness at the wound, or calf pain, call your surgeon's office.   Constipation Prevention    Complete by:  As directed    Drink plenty of fluids.  Prune juice may be helpful.  You may use a stool softener, such as Colace (over the counter) 100 mg twice a day.  Use MiraLax (over the counter) for constipation as needed.   Diet general    Complete by:  As directed    Discharge instructions    Complete by:  As directed    Driving restrictions    Complete by:  As directed    No driving until after follow up with Dr. Mack Guise   Increase activity slowly as tolerated    Complete by:  As directed    Lifting restrictions    Complete by:  As directed    No lifting for 6-8 weeks         Signed: [place] ,MD 01/18/2017, 12:55 PM

## 2017-01-18 NOTE — Progress Notes (Signed)
Patient is alert and oriented and able to verbalize needs. No complaints of pain at this time. VSS. PIV removed. Discharge instructions gone over with patient. Printed AVS and rx for Oxycodone given to patient. Patient verbalized understanding of all discharge instructions and follow up appts. No concerns voiced at this time. Room checked for all patients belongings. Patient called granddaughter for transportation home.  Leslie Parker

## 2017-01-18 NOTE — Progress Notes (Signed)
Inpatient Diabetes Program Recommendations  AACE/ADA: New Consensus Statement on Inpatient Glycemic Control (2015)  Target Ranges:  Prepandial:   less than 140 mg/dL      Peak postprandial:   less than 180 mg/dL (1-2 hours)      Critically ill patients:  140 - 180 mg/dL   Lab Results  Component Value Date   GLUCAP 143 (H) 01/17/2017   HGBA1C 7.5 (H) 01/20/2014    Review of Glycemic Control  No recent CBG- fasting lab glucose 361 mg/dl  Diabetes history: type 2 Outpatient Diabetes medications: Metformin 500mg  bid Current orders for Inpatient glycemic control: Metformin 500mg  bid  Inpatient Diabetes Program Recommendations:  Please consider ordering CBG tid and hs.  Please D/C Metformin while inpatient and add Novolog 0-9 units tid, Novolog 0-5 units qhs   Per ADA recommendations "consider performing an A1C on all patients with diabetes or hyperglycemia admitted to the hospital if not performed in the prior 3 months".  Gentry Fitz, RN, BA, MHA, CDE Diabetes Coordinator Inpatient Diabetes Program  [phone] (Team Pager) [phone] ([place]) 01/18/2017 10:20 AM

## 2017-01-25 ENCOUNTER — Encounter: Payer: Self-pay | Admitting: Emergency Medicine

## 2017-01-25 ENCOUNTER — Emergency Department: Payer: Medicare Other

## 2017-01-25 ENCOUNTER — Inpatient Hospital Stay
Admission: EM | Admit: 2017-01-25 | Discharge: 2017-01-28 | DRG: 470 | Disposition: A | Discharge status: Skilled Nursing Facility | Payer: Medicare Other | Attending: Internal Medicine | Admitting: Internal Medicine

## 2017-01-25 DIAGNOSIS — Z90710 Acquired absence of both cervix and uterus: Secondary | ICD-10-CM

## 2017-01-25 DIAGNOSIS — Z7984 Long term (current) use of oral hypoglycemic drugs: Secondary | ICD-10-CM | POA: Diagnosis not present

## 2017-01-25 DIAGNOSIS — M25552 Pain in left hip: Secondary | ICD-10-CM | POA: Diagnosis present

## 2017-01-25 DIAGNOSIS — Z9049 Acquired absence of other specified parts of digestive tract: Secondary | ICD-10-CM

## 2017-01-25 DIAGNOSIS — N183 Chronic kidney disease, stage 3 (moderate): Secondary | ICD-10-CM | POA: Diagnosis present

## 2017-01-25 DIAGNOSIS — Z7982 Long term (current) use of aspirin: Secondary | ICD-10-CM

## 2017-01-25 DIAGNOSIS — Z96611 Presence of right artificial shoulder joint: Secondary | ICD-10-CM | POA: Diagnosis present

## 2017-01-25 DIAGNOSIS — Z9842 Cataract extraction status, left eye: Secondary | ICD-10-CM

## 2017-01-25 DIAGNOSIS — Z825 Family history of asthma and other chronic lower respiratory diseases: Secondary | ICD-10-CM | POA: Diagnosis not present

## 2017-01-25 DIAGNOSIS — E1122 Type 2 diabetes mellitus with diabetic chronic kidney disease: Secondary | ICD-10-CM | POA: Diagnosis present

## 2017-01-25 DIAGNOSIS — Z96649 Presence of unspecified artificial hip joint: Secondary | ICD-10-CM

## 2017-01-25 DIAGNOSIS — S72012A Unspecified intracapsular fracture of left femur, initial encounter for closed fracture: Principal | ICD-10-CM | POA: Diagnosis present

## 2017-01-25 DIAGNOSIS — Z8249 Family history of ischemic heart disease and other diseases of the circulatory system: Secondary | ICD-10-CM | POA: Diagnosis not present

## 2017-01-25 DIAGNOSIS — Z7951 Long term (current) use of inhaled steroids: Secondary | ICD-10-CM

## 2017-01-25 DIAGNOSIS — S72002A Fracture of unspecified part of neck of left femur, initial encounter for closed fracture: Secondary | ICD-10-CM

## 2017-01-25 DIAGNOSIS — R011 Cardiac murmur, unspecified: Secondary | ICD-10-CM | POA: Diagnosis present

## 2017-01-25 DIAGNOSIS — J45909 Unspecified asthma, uncomplicated: Secondary | ICD-10-CM | POA: Diagnosis present

## 2017-01-25 DIAGNOSIS — Z9841 Cataract extraction status, right eye: Secondary | ICD-10-CM

## 2017-01-25 DIAGNOSIS — M8580 Other specified disorders of bone density and structure, unspecified site: Secondary | ICD-10-CM | POA: Diagnosis present

## 2017-01-25 DIAGNOSIS — Z9889 Other specified postprocedural states: Secondary | ICD-10-CM

## 2017-01-25 DIAGNOSIS — Z96653 Presence of artificial knee joint, bilateral: Secondary | ICD-10-CM | POA: Diagnosis present

## 2017-01-25 DIAGNOSIS — I129 Hypertensive chronic kidney disease with stage 1 through stage 4 chronic kidney disease, or unspecified chronic kidney disease: Secondary | ICD-10-CM | POA: Diagnosis present

## 2017-01-25 DIAGNOSIS — Z79899 Other long term (current) drug therapy: Secondary | ICD-10-CM

## 2017-01-25 DIAGNOSIS — Z961 Presence of intraocular lens: Secondary | ICD-10-CM | POA: Diagnosis present

## 2017-01-25 HISTORY — DX: Fracture of unspecified part of neck of left femur, initial encounter for closed fracture: S72.002A

## 2017-01-25 LAB — CBC WITH DIFFERENTIAL/PLATELET
BASOS ABS: 0 10*3/uL (ref 0–0.1)
Basophils Relative: 0 %
EOS PCT: 1 %
Eosinophils Absolute: 0.1 10*3/uL (ref 0–0.7)
HCT: 40.8 % (ref 35.0–47.0)
Hemoglobin: 14 g/dL (ref 12.0–16.0)
LYMPHS PCT: 9 %
Lymphs Abs: 0.9 10*3/uL — ABNORMAL LOW (ref 1.0–3.6)
MCH: 30 pg (ref 26.0–34.0)
MCHC: 34.3 g/dL (ref 32.0–36.0)
MCV: 87.6 fL (ref 80.0–100.0)
Monocytes Absolute: 0.8 10*3/uL (ref 0.2–0.9)
Monocytes Relative: 8 %
NEUTROS ABS: 8 10*3/uL — AB (ref 1.4–6.5)
NEUTROS PCT: 82 %
PLATELETS: 267 10*3/uL (ref 150–440)
RBC: 4.66 MIL/uL (ref 3.80–5.20)
RDW: 13.8 % (ref 11.5–14.5)
WBC: 9.7 10*3/uL (ref 3.6–11.0)

## 2017-01-25 LAB — PROTIME-INR
INR: 0.95
PROTHROMBIN TIME: 12.7 s (ref 11.4–15.2)

## 2017-01-25 LAB — BASIC METABOLIC PANEL
ANION GAP: 11 (ref 5–15)
BUN: 30 mg/dL — AB (ref 6–20)
CO2: 21 mmol/L — ABNORMAL LOW (ref 22–32)
Calcium: 9.4 mg/dL (ref 8.9–10.3)
Chloride: 102 mmol/L (ref 101–111)
Creatinine, Ser: 1.26 mg/dL — ABNORMAL HIGH (ref 0.44–1.00)
GFR, EST AFRICAN AMERICAN: 45 mL/min — AB (ref >60)
GFR, EST NON AFRICAN AMERICAN: 39 mL/min — AB (ref >60)
Glucose, Bld: 177 mg/dL — ABNORMAL HIGH (ref 65–99)
POTASSIUM: 4 mmol/L (ref 3.5–5.1)
SODIUM: 134 mmol/L — AB (ref 135–145)

## 2017-01-25 LAB — GLUCOSE, CAPILLARY
GLUCOSE-CAPILLARY: 143 mg/dL — AB (ref 65–99)
Glucose-Capillary: 136 mg/dL — ABNORMAL HIGH (ref 65–99)

## 2017-01-25 LAB — MRSA PCR SCREENING: MRSA by PCR: NEGATIVE

## 2017-01-25 MED ORDER — HYDRALAZINE HCL 20 MG/ML IJ SOLN: 10.0000 mg | Freq: Four times a day (QID) | INTRAMUSCULAR | Status: DC | PRN

## 2017-01-25 MED ORDER — SODIUM CHLORIDE 0.9 % IV SOLN
INTRAVENOUS | Status: DC
  Administered 2017-01-25: 17:00:00 via INTRAVENOUS

## 2017-01-25 MED ORDER — AMLODIPINE BESYLATE 10 MG PO TABS
10.0000 mg | ORAL_TABLET | Freq: Every day | ORAL | Status: DC
  Administered 2017-01-25: 10 mg via ORAL
  Filled 2017-01-25: qty 1

## 2017-01-25 MED ORDER — MAGNESIUM OXIDE 400 (241.3 MG) MG PO TABS
400.0000 mg | ORAL_TABLET | Freq: Every day | ORAL | Status: DC
  Filled 2017-01-25: qty 1

## 2017-01-25 MED ORDER — ONDANSETRON HCL 4 MG/2ML IJ SOLN: 4.0000 mg | Freq: Four times a day (QID) | INTRAMUSCULAR | Status: DC | PRN

## 2017-01-25 MED ORDER — POLYETHYLENE GLYCOL 3350 17 G PO PACK: 17.0000 g | PACK | Freq: Every day | ORAL | Status: DC | PRN

## 2017-01-25 MED ORDER — TRAZODONE HCL 100 MG PO TABS
100.0000 mg | ORAL_TABLET | Freq: Every day | ORAL | Status: DC
  Administered 2017-01-25: 100 mg via ORAL
  Filled 2017-01-25: qty 1

## 2017-01-25 MED ORDER — ONDANSETRON HCL 4 MG PO TABS: 4.0000 mg | ORAL_TABLET | Freq: Four times a day (QID) | ORAL | Status: DC | PRN

## 2017-01-25 MED ORDER — INSULIN ASPART 100 UNIT/ML ~~LOC~~ SOLN: 0.0000 [IU] | SUBCUTANEOUS | Status: DC

## 2017-01-25 MED ORDER — ONDANSETRON HCL 4 MG/2ML IJ SOLN
4.0000 mg | Freq: Once | INTRAMUSCULAR | Status: AC
  Administered 2017-01-25: 4 mg via INTRAVENOUS
  Filled 2017-01-25: qty 2

## 2017-01-25 MED ORDER — MORPHINE SULFATE (PF) 4 MG/ML IV SOLN
INTRAVENOUS | Status: AC
  Filled 2017-01-25: qty 1

## 2017-01-25 MED ORDER — HYDROCODONE-ACETAMINOPHEN 5-325 MG PO TABS
1.0000 | ORAL_TABLET | ORAL | Status: DC | PRN
  Administered 2017-01-25: 1 via ORAL
  Administered 2017-01-26: 2 via ORAL
  Filled 2017-01-25: qty 1
  Filled 2017-01-25: qty 2

## 2017-01-25 MED ORDER — MORPHINE SULFATE (PF) 2 MG/ML IV SOLN: 2.0000 mg | INTRAVENOUS | Status: DC | PRN

## 2017-01-25 MED ORDER — LISINOPRIL 10 MG PO TABS
10.0000 mg | ORAL_TABLET | Freq: Every day | ORAL | Status: DC
  Administered 2017-01-25: 10 mg via ORAL
  Filled 2017-01-25: qty 1

## 2017-01-25 MED ORDER — ACETAMINOPHEN 325 MG PO TABS
650.0000 mg | ORAL_TABLET | Freq: Four times a day (QID) | ORAL | Status: DC | PRN
  Administered 2017-01-25: 650 mg via ORAL
  Filled 2017-01-25: qty 2

## 2017-01-25 MED ORDER — VANCOMYCIN HCL IN DEXTROSE 1-5 GM/200ML-% IV SOLN
1000.0000 mg | Freq: Once | INTRAVENOUS | Status: AC
  Administered 2017-01-26: 1000 mg via INTRAVENOUS
  Filled 2017-01-25: qty 200

## 2017-01-25 MED ORDER — INSULIN ASPART 100 UNIT/ML ~~LOC~~ SOLN
0.0000 [IU] | Freq: Three times a day (TID) | SUBCUTANEOUS | Status: DC
  Administered 2017-01-25 – 2017-01-26 (×2): 1 [IU] via SUBCUTANEOUS
  Filled 2017-01-25: qty 1

## 2017-01-25 MED ORDER — SODIUM CHLORIDE 0.9 % IV SOLN
Freq: Once | INTRAVENOUS | Status: AC
  Administered 2017-01-25: 14:00:00 via INTRAVENOUS

## 2017-01-25 MED ORDER — INSULIN ASPART 100 UNIT/ML ~~LOC~~ SOLN
0.0000 [IU] | Freq: Every day | SUBCUTANEOUS | Status: DC
Start: 2017-01-25 — End: 2017-01-26

## 2017-01-25 MED ORDER — MORPHINE SULFATE (PF) 4 MG/ML IV SOLN
4.0000 mg | Freq: Once | INTRAVENOUS | Status: AC
  Administered 2017-01-25: 4 mg via INTRAVENOUS

## 2017-01-25 MED ORDER — ACETAMINOPHEN 650 MG RE SUPP
650.0000 mg | Freq: Four times a day (QID) | RECTAL | Status: DC | PRN
Start: 2017-01-25 — End: 2017-01-26

## 2017-01-25 MED ORDER — MOMETASONE FURO-FORMOTEROL FUM 200-5 MCG/ACT IN AERO
2.0000 | INHALATION_SPRAY | Freq: Two times a day (BID) | RESPIRATORY_TRACT | Status: DC
  Administered 2017-01-25: 2 via RESPIRATORY_TRACT
  Filled 2017-01-25: qty 8.8

## 2017-01-25 NOTE — ED Triage Notes (Signed)
Patient from home via [facility]. Reports having surgery last Thursday to have a screw removed from left hip. Patient states she has had increasing pain in left hip for the past 2-3 days. Sight of incision still with original dressing. Redness, tenderness and warmth noted to incision. Increased pain with weightbearing and movement. Patient A&O x4.

## 2017-01-25 NOTE — Progress Notes (Signed)
Patient admitted to the unit. Alert and oriented x4. Verbalizes pain of a 3 out of 10. Denies the need for pain medication. Skin assessment was completed. 2 surgical incisions with staples noted on left outer thigh with ecchymosis around it. Sacral patch was applied. Vital signs stable at this time.   Wynema Birch, RN

## 2017-01-25 NOTE — Progress Notes (Signed)
Spoke with Dr. Sabra Heck of orthopedics surgery. Surgery is being scheduled for tomorrow morning. We'll start patient on diabetic diet.

## 2017-01-25 NOTE — ED Provider Notes (Signed)
[facility] Emergency Department Provider Note  ____________________________________________  Time seen: Approximately 1:35 PM  I have reviewed the triage vital signs and the nursing notes.   HISTORY  Chief Complaint Hip Pain    HPI Leslie Parker is a 83 y.o. female brought to the ED for evaluation of worsening left hip pain. She has a history of left hip fixation after a femur fracture. For a long time, she had had left hip pain and this was recently determined to be due to a protruding fixation leg screw in the left hip. This was surgically removed about a week ago on 01/17/2017. She was discharged home and was ambulatory, but over the last week she's had gradually worsening symptoms and increasing pain with ambulation. Pain is worse in the left superior hip. No new falls or other injuries. No fever or chills.. Pain radiates down the femur. No alleviating factors. Severe in intensity, aching.  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 Asthma   . Cancer (HCC)    Basal Cell  . Diabetes mellitus without complication ([place])   . Heart murmur   . Hypertension   . Impetigo   . Osteoarthritis   . Osteopenia   . Wears dentures    full upper and lower     Patient Active Problem List   Diagnosis Date Noted  . Closed left hip fracture ([place]) 01/25/2017  . S/P hardware removal 01/17/2017  . Asthma, chronic 02/10/2015  . COPD exacerbation ([place]) 02/10/2015  . Acute on chronic respiratory failure ([place]) 02/10/2015  . Atelectasis 02/10/2015  . Hypoxia   . Shortness of breath   . Femur fracture, left ([place]) 02/03/2015  . Hypertension 02/03/2015     Past Surgical History:  Procedure Laterality Date  . ABDOMINAL HYSTERECTOMY    . BLADDER SURGERY     mesh  . CARPAL TUNNEL RELEASE Bilateral   . CATARACT EXTRACTION Right 2017  . CATARACT EXTRACTION W/PHACO Left 02/06/2016   Procedure: CATARACT EXTRACTION PHACO AND  INTRAOCULAR LENS PLACEMENT ([place]) left eye;  Surgeon: Ronnell Freshwater, MD;  Location: [place];  Service: Ophthalmology;  Laterality: Left;  DIABETIC LEFT Cannot arrive before 9:30  . CERVICAL DISC SURGERY    . CHOLECYSTECTOMY    . EYE SURGERY Bilateral    Cataract Extraction with IOL  . FEMUR IM NAIL Left 02/04/2015   Procedure: INTRAMEDULLARY (IM) NAIL FEMORAL;  Surgeon: Thornton Park, MD;  Location: [facility];  Service: Orthopedics;  Laterality: Left;  . HARDWARE REMOVAL Left 01/17/2017   Procedure: HARDWARE REMOVAL;  Surgeon: Thornton Park, MD;  Location: [facility];  Service: Orthopedics;  Laterality: Left;  . HERNIA REPAIR  2014   esophageal and gastric mesh. patient unable to throw up d/t mesh  . JOINT REPLACEMENT Bilateral    knees  . REPLACEMENT TOTAL KNEE BILATERAL Bilateral [id number]  . SHOULDER ARTHROSCOPY WITH OPEN ROTATOR CUFF REPAIR AND DISTAL CLAVICLE ACROMINECTOMY Left 10/25/2016   Procedure: SHOULDER ARTHROSCOPY WITH OPEN ROTATOR CUFF REPAIR AND DISTAL CLAVICLE ACROMINECTOMY;  Surgeon: Thornton Park, MD;  Location: [facility];  Service: Orthopedics;  Laterality: Left;  . THYROID SURGERY     goiter removed  . TOTAL SHOULDER REPLACEMENT Right 2012     Prior to Admission medications   Medication Sig Start Date End Date Taking? Authorizing Provider  ADVAIR DISKUS 500-50 MCG/DOSE AEPB Inhale 1 puff into the lungs 2 (two) times daily. 07/18/16  Yes  [provider]  amLODipine (NORVASC) 10 MG tablet Take 10 mg by mouth daily. 01/05/14  Yes [provider]  aspirin EC 325 MG tablet Take 1 tablet (325 mg total) by mouth daily. 01/18/17  Yes Thornton Park, MD  Cyanocobalamin (B-12) 2500 MCG TABS Take 2,500 mcg by mouth daily.   Yes [provider]  lisinopril (PRINIVIL,ZESTRIL) 10 MG tablet Take 10 mg by mouth daily.    Yes [provider]  Magnesium Oxide 500 MG CAPS Take 500 mg by mouth daily.   Yes [provider]  metFORMIN (GLUCOPHAGE) 500 MG tablet Take 1 tablet by mouth 2 (two) times daily. 06/23/14  Yes [provider]  Multiple Vitamin (MULTI-VITAMINS) TABS Take 1 tablet by mouth daily.   Yes [provider]  traZODone (DESYREL) 100 MG tablet Take 100 mg by mouth at bedtime.   Yes [provider]  Fluticasone-Salmeterol (ADVAIR DISKUS) 500-50 MCG/DOSE AEPB Inhale 1 puff into the lungs 2 (two) times daily. 10/05/14 02/01/16  [provider]  ondansetron (ZOFRAN) 4 MG tablet Take 1 tablet (4 mg total) by mouth every 8 (eight) hours as needed for nausea or vomiting. Patient not taking: Reported on 01/10/2017 10/25/16   Thornton Park, MD  oxyCODONE (OXY IR/ROXICODONE) 5 MG immediate release tablet Take 1-2 tablets (5-10 mg total) by mouth every 4 (four) hours as needed for breakthrough pain ((for MODERATE breakthrough pain)). 01/18/17   Thornton Park, MD  traMADol (ULTRAM) 50 MG tablet Take 50 mg by mouth daily.    [provider]     Allergies Patient has no known allergies.   Family History  Problem Relation Age of Onset  . Heart failure Mother   . Hypertension Mother   . Emphysema Father     Social History Social History  Substance Use Topics  . Smoking status: Never Smoker  . Smokeless tobacco: Never Used  . Alcohol use No    Review of Systems  Constitutional:   No fever or chills.  ENT:   No sore throat. No rhinorrhea. Cardiovascular:   No chest pain or syncope. Respiratory:   No dyspnea or cough. Gastrointestinal:   Negative for abdominal pain, vomiting and diarrhea.  Musculoskeletal:   Left hip pain as above. All other systems reviewed and are negative except as documented above in ROS and HPI.  ____________________________________________   PHYSICAL EXAM:  VITAL SIGNS: ED Triage Vitals  Enc Vitals Group     BP 01/25/17 1252 (!) 162/85     Pulse Rate 01/25/17 1252 (!) 105     Resp 01/25/17 1252 18     Temp 01/25/17 1252  99.9 F (37.7 C)     Temp Source 01/25/17 1252 Oral     SpO2 01/25/17 1252 97 %     Weight 01/25/17 1253 165 lb (74.8 kg)     Height 01/25/17 1253 5\' 9"  (1.753 m)     Head Circumference --      Peak Flow --      Pain Score 01/25/17 1252 7     Pain Loc --      Pain Edu? --      Excl. in [place]? --     Vital signs reviewed, nursing assessments reviewed.   Constitutional:   Alert and oriented. Well appearing and in no distress. Eyes:   No scleral icterus.  EOMI. No nystagmus. No conjunctival pallor. PERRL. ENT   Head:   Normocephalic and atraumatic.   Nose:  No congestion/rhinnorhea.    Mouth/Throat:   MMM, no pharyngeal erythema. No peritonsillar mass.    Neck:   No meningismus. Full ROM Hematological/Lymphatic/Immunilogical:   No cervical lymphadenopathy. Cardiovascular:   RRR. Symmetric bilateral radial and DP pulses.  No murmurs.  Respiratory:   Normal respiratory effort without tachypnea/retractions. Breath sounds are clear and equal bilaterally. No wheezes/rales/rhonchi. Gastrointestinal:   Soft and nontender. Non distended. There is no CVA tenderness.  No rebound, rigidity, or guarding. Genitourinary:   deferred Musculoskeletal:   Shortening of the left leg compared to the right. Severe pain with any movement of the left hip. There is ecchymosis over the left thigh, tenderness over the left iliac wing. No significant tenderness over the femur itself. Surgical incisions are well-healing and not inflamed and not draining. Neurologic:   Normal speech and language.  Motor grossly intact. No gross focal neurologic deficits are appreciated.  Skin:    Skin is warm, dry and intact. No rash noted.  No petechiae, purpura, or bullae.  ____________________________________________    LABS (pertinent positives/negatives) (all labs ordered are listed, but only abnormal results are displayed) Labs Reviewed  BASIC METABOLIC PANEL - Abnormal; Notable for the following:        Result Value   Sodium 134 (*)    CO2 21 (*)    Glucose, Bld 177 (*)    BUN 30 (*)    Creatinine, Ser 1.26 (*)    GFR calc non Af Amer 39 (*)    GFR calc Af Amer 45 (*)    All other components within normal limits  CBC WITH DIFFERENTIAL/PLATELET - Abnormal; Notable for the following:    Neutro Abs 8.0 (*)    Lymphs Abs 0.9 (*)    All other components within normal limits  HEMOGLOBIN A1C   ____________________________________________   EKG    ____________________________________________    RADIOLOGY  Dg Hip Unilat W Or Wo Pelvis 2-3 Views Left  Result Date: 01/25/2017 CLINICAL DATA:  Post scratched it recent screw removal from left hip. EXAM: DG HIP (WITH OR WITHOUT PELVIS) 2-3V LEFT COMPARISON:  02/04/2015. FINDINGS: New left femoral neck fracture. Left hip surgical screw again is been removed. Intramedullary rod and distal left femoral screw is in place. Left knee replacement again noted. No acute bony abnormality identified. 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 IMPRESSION: 1. New left femoral neck fracture. 2. Postsurgical changes left hip with prior removal of left hip screw. intramedullary rod and distal femoral screw in place. Electronically Signed   By: Marcello Moores  Register   On: 01/25/2017 13:38    ____________________________________________   PROCEDURES Procedures  ____________________________________________   INITIAL IMPRESSION / ASSESSMENT AND PLAN / ED COURSE  Pertinent labs & imaging results that were available during my care of the patient were reviewed by me and considered in my medical decision making (see chart for details).  Patient presents with worsening left hip pain, concern for acute fracture of the left hip with shortening and inability to bear weight. We'll get x-ray, check labs. Patient took 2 of her oxycodone at home at 11 AM, so we'll give 4 of morphine for additional pain control. If  x-rays are negative I would proceed with a CT scan of the pelvis and left femur to further assess.   ----------------------------------------- 2:34 PM on 01/25/2017 -----------------------------------------  X-ray shows new left femoral neck fracture. Discussed with orthopedics Dr. Sabra Heck who will plan for her left hip  hemiarthroplasty today or tomorrow. Patient will remain nothing by mouth. Discussed with the hospitalist for admission. Patient updated.     ____________________________________________   FINAL CLINICAL IMPRESSION(S) / ED DIAGNOSES  Final diagnoses:  Left hip pain  Closed displaced fracture of left femoral neck (HCC)      New Prescriptions   No medications on file     Portions of this note were generated with dragon dictation software. Dictation errors may occur despite best attempts at proofreading.    Carrie Mew, MD 01/25/17 1434

## 2017-01-25 NOTE — H&P (Signed)
[facility] NAME: Henessy Rohrer    MR#:  [id number]  DATE OF BIRTH:  February 05, 1933  DATE OF ADMISSION:  01/25/2017  PRIMARY CARE PHYSICIAN: Patient, No Pcp Per   REQUESTING/REFERRING PHYSICIAN: Dr. Joni Fears  CHIEF COMPLAINT:   Chief Complaint  Patient presents with  . Hip Pain    HISTORY OF PRESENT ILLNESS:  Regana Kemple  is a 83 y.o. female with a known history of Hypertension, diabetes who recently had a left hip lag screw removed after having a left intertrochanteric hip fracture repaired in July 2016 returned to the emergency room with left hip fracture. Patient noticed yesterday after walking with a walker of acute onset of pain in the hip. Did not have any fall or trauma. Still has staples in the left hip. No syncope. No chest pain or shortness of breath. Seems to have good functional status and tolerated all her surgeries well. X-ray shows new left femoral neck fracture  PAST MEDICAL HISTORY:   Past Medical History:  Diagnosis Date  . Asthma   . Cancer (HCC)    Basal Cell  . Diabetes mellitus without complication ([place])   . Heart murmur   . Hypertension   . Impetigo   . Osteoarthritis   . Osteopenia   . Wears dentures    full upper and lower    PAST SURGICAL HISTORY:   Past Surgical History:  Procedure Laterality Date  . ABDOMINAL HYSTERECTOMY    . BLADDER SURGERY     mesh  . CARPAL TUNNEL RELEASE Bilateral   . CATARACT EXTRACTION Right 2017  . CATARACT EXTRACTION W/PHACO Left 02/06/2016   Procedure: CATARACT EXTRACTION PHACO AND INTRAOCULAR LENS PLACEMENT ([place]) left eye;  Surgeon: Ronnell Freshwater, MD;  Location: [place];  Service: Ophthalmology;  Laterality: Left;  DIABETIC LEFT Cannot arrive before 9:30  . CERVICAL DISC SURGERY    . CHOLECYSTECTOMY    . EYE SURGERY Bilateral    Cataract Extraction with IOL  . FEMUR IM NAIL Left 02/04/2015   Procedure: INTRAMEDULLARY (IM) NAIL FEMORAL;   Surgeon: Thornton Park, MD;  Location: [facility];  Service: Orthopedics;  Laterality: Left;  . HARDWARE REMOVAL Left 01/17/2017   Procedure: HARDWARE REMOVAL;  Surgeon: Thornton Park, MD;  Location: [facility];  Service: Orthopedics;  Laterality: Left;  . HERNIA REPAIR  2014   esophageal and gastric mesh. patient unable to throw up d/t mesh  . JOINT REPLACEMENT Bilateral    knees  . REPLACEMENT TOTAL KNEE BILATERAL Bilateral [id number]  . SHOULDER ARTHROSCOPY WITH OPEN ROTATOR CUFF REPAIR AND DISTAL CLAVICLE ACROMINECTOMY Left 10/25/2016   Procedure: SHOULDER ARTHROSCOPY WITH OPEN ROTATOR CUFF REPAIR AND DISTAL CLAVICLE ACROMINECTOMY;  Surgeon: Thornton Park, MD;  Location: [facility];  Service: Orthopedics;  Laterality: Left;  . THYROID SURGERY     goiter removed  . TOTAL SHOULDER REPLACEMENT Right 2012    SOCIAL HISTORY:   Social History  Substance Use Topics  . Smoking status: Never Smoker  . Smokeless tobacco: Never Used  . Alcohol use No    FAMILY HISTORY:   Family History  Problem Relation Age of Onset  . Heart failure Mother   . Hypertension Mother   . Emphysema Father     DRUG ALLERGIES:  No Known Allergies  REVIEW OF SYSTEMS:   Review of Systems  Constitutional: Positive for malaise/fatigue. Negative for chills, fever and weight loss.  HENT: Negative for hearing loss, nosebleeds and sore  throat.   Eyes: Negative for blurred vision, double vision and pain.  Respiratory: Negative for cough, hemoptysis, sputum production, shortness of breath and wheezing.   Cardiovascular: Negative for chest pain, palpitations, orthopnea and leg swelling.  Gastrointestinal: Negative for abdominal pain, constipation, diarrhea, heartburn, nausea and vomiting.  Genitourinary: Negative for dysuria and hematuria.  Musculoskeletal: Positive for joint pain. Negative for back pain, falls and myalgias.  Skin: Negative for rash.  Neurological: Negative for dizziness, tremors, sensory change,  speech change, focal weakness, seizures and headaches.  Endo/Heme/Allergies: Does not bruise/bleed easily.  Psychiatric/Behavioral: Negative for depression and memory loss. The patient is not nervous/anxious.     MEDICATIONS AT HOME:   Prior to Admission medications   Medication Sig Start Date End Date Taking? Authorizing Provider  ADVAIR DISKUS 500-50 MCG/DOSE AEPB Inhale 1 puff into the lungs 2 (two) times daily. 07/18/16  Yes [provider]  amLODipine (NORVASC) 10 MG tablet Take 10 mg by mouth daily. 01/05/14  Yes [provider]  aspirin EC 325 MG tablet Take 1 tablet (325 mg total) by mouth daily. 01/18/17  Yes Thornton Park, MD  Cyanocobalamin (B-12) 2500 MCG TABS Take 2,500 mcg by mouth daily.   Yes [provider]  lisinopril (PRINIVIL,ZESTRIL) 10 MG tablet Take 10 mg by mouth daily.    Yes [provider]  Magnesium Oxide 500 MG CAPS Take 500 mg by mouth daily.   Yes [provider]  metFORMIN (GLUCOPHAGE) 500 MG tablet Take 1 tablet by mouth 2 (two) times daily. 06/23/14  Yes [provider]  Multiple Vitamin (MULTI-VITAMINS) TABS Take 1 tablet by mouth daily.   Yes [provider]  traZODone (DESYREL) 100 MG tablet Take 100 mg by mouth at bedtime.   Yes [provider]  Fluticasone-Salmeterol (ADVAIR DISKUS) 500-50 MCG/DOSE AEPB Inhale 1 puff into the lungs 2 (two) times daily. 10/05/14 02/01/16  [provider]  ondansetron (ZOFRAN) 4 MG tablet Take 1 tablet (4 mg total) by mouth every 8 (eight) hours as needed for nausea or vomiting. Patient not taking: Reported on 01/10/2017 10/25/16   Thornton Park, MD  oxyCODONE (OXY IR/ROXICODONE) 5 MG immediate release tablet Take 1-2 tablets (5-10 mg total) by mouth every 4 (four) hours as needed for breakthrough pain ((for MODERATE breakthrough pain)). 01/18/17   Thornton Park, MD  traMADol (ULTRAM) 50 MG tablet Take 50 mg by mouth daily.    [provider]     VITAL SIGNS:  Blood pressure (!) 151/83, pulse (!) 101, temperature 99.9 F (37.7 C), temperature source Oral, resp. rate 18, height 5\' 9"  (1.753 m), weight 74.8 kg (165 lb), SpO2 97 %.  PHYSICAL EXAMINATION:  Physical Exam  GENERAL:  83 y.o.-year-old patient lying in the bed with no acute distress.  EYES: Pupils equal, round, reactive to light and accommodation. No scleral icterus. Extraocular muscles intact.  HEENT: Head atraumatic, normocephalic. Oropharynx and nasopharynx clear. No oropharyngeal erythema, moist oral mucosa  NECK:  Supple, no jugular venous distention. No thyroid enlargement, no tenderness.  LUNGS: Normal breath sounds bilaterally, no wheezing, rales, rhonchi. No use of accessory muscles of respiration.  CARDIOVASCULAR: S1, S2 normal. No murmurs, rubs, or gallops.  ABDOMEN: Soft, nontender, nondistended. Bowel sounds present. No organomegaly or mass.  EXTREMITIES: Left hip surgical wound with staples in place. Bruising around it. No discharge or swelling. Tender.  NEUROLOGIC: Cranial nerves II through XII are intact. No focal Motor or sensory deficits appreciated b/l PSYCHIATRIC: The patient is  alert and oriented x 3. Good affect.  SKIN: No obvious rash, lesion, or ulcer.   LABORATORY PANEL:   CBC  Recent Labs Lab 01/25/17 1258  WBC 9.7  HGB 14.0  HCT 40.8  PLT 267   ------------------------------------------------------------------------------------------------------------------  Chemistries   Recent Labs Lab 01/25/17 1258  NA 134*  K 4.0  CL 102  CO2 21*  GLUCOSE 177*  BUN 30*  CREATININE 1.26*  CALCIUM 9.4   ------------------------------------------------------------------------------------------------------------------  Cardiac Enzymes No results for input(s): TROPONINI in the last 168 hours. ------------------------------------------------------------------------------------------------------------------  RADIOLOGY:   Dg Hip Unilat W Or Wo Pelvis 2-3 Views Left  Result Date: 01/25/2017 CLINICAL DATA:  Post scratched it recent screw removal from left hip. EXAM: DG HIP (WITH OR WITHOUT PELVIS) 2-3V LEFT COMPARISON:  02/04/2015. FINDINGS: New left femoral neck fracture. Left hip surgical screw again is been removed. Intramedullary rod and distal left femoral screw is in place. Left knee replacement again noted. No acute bony abnormality identified. 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 IMPRESSION: 1. New left femoral neck fracture. 2. Postsurgical changes left hip with prior removal of left hip screw. intramedullary rod and distal femoral screw in place. Electronically Signed   By: Marcello Moores  Register   On: 01/25/2017 13:38     IMPRESSION AND PLAN:   * Left femoral neck fracture. Nothing by mouth. SCDs. Heparin and Lovenox per orthopedics. Pain medications added as needed. Patient tolerated her prior surgeries well and should be low risk for orthopedic surgery. Will check an EKG. Orthopedics Dr. Sabra Heck informed from emergency room. Incentive spirometer  * Diabetes mellitus. Start sliding scale insulin.  * Hypertension. Continue home medications. IV when necessary medications added.  * CKD stage III is stable  All the records are reviewed and case discussed with ED provider. Management plans discussed with the patient, family and they are in agreement.  CODE STATUS: Full code  TOTAL TIME TAKING CARE OF THIS PATIENT: 40 minutes.   Hillary Bow R M.D on 01/25/2017 at 2:03 PM  Between 7am to 6pm - Pager - [phone]  After 6pm go to [email or web address] - password [email or web address] [facility]  Office  [phone]  CC: Primary care physician; Patient, No Pcp Per  Note: This dictation was prepared with Dragon dictation along with smaller phrase technology. Any transcriptional errors that result from this process are unintentional.

## 2017-01-25 NOTE — Consult Note (Signed)
ORTHOPAEDIC CONSULTATION  REQUESTING PHYSICIAN: Hillary Bow, MD  Chief Complaint: Left hip pain  HPI: Leslie Parker is a 83 y.o. female who complains of  left hip pain increasing over the last 24 hours.  The patient had the large crossing screw of her Affixus femoral rod removed 6 days ago by Dr. Mack Guise because of painful bursitis over the prominence of the screw.  She has been on a walker with minimal weightbearing at home but had progressive pain over the last day or so.  This became so severe that she came to the emergency room today.  She has not had any falls.  She says.  Examination and x-rays show there is now a subcapital fracture of the hip with completely displacement of the femoral head.  I have discussed treatment with the patient and her sister at length.  Recommended removal of the remaining femoral rod and hemiarthroplasty to relieve the pain and allow her to rehabilitate properly.  The risks and benefits and postop protocol were discussed with them.  We will plan to proceed with this tomorrow morning.  They are in agreement with this plan.  Completely.  Past Medical History:  Diagnosis Date  . Asthma   . Cancer (HCC)    Basal Cell  . Diabetes mellitus without complication ([place])   . Heart murmur   . Hypertension   . Impetigo   . Osteoarthritis   . Osteopenia   . Wears dentures    full upper and lower   Past Surgical History:  Procedure Laterality Date  . ABDOMINAL HYSTERECTOMY    . BLADDER SURGERY     mesh  . CARPAL TUNNEL RELEASE Bilateral   . CATARACT EXTRACTION Right 2017  . CATARACT EXTRACTION W/PHACO Left 02/06/2016   Procedure: CATARACT EXTRACTION PHACO AND INTRAOCULAR LENS PLACEMENT ([place]) left eye;  Surgeon: Ronnell Freshwater, MD;  Location: [place];  Service: Ophthalmology;  Laterality: Left;  DIABETIC LEFT Cannot arrive before 9:30  . CERVICAL DISC SURGERY    . CHOLECYSTECTOMY    . EYE SURGERY Bilateral    Cataract Extraction with  IOL  . FEMUR IM NAIL Left 02/04/2015   Procedure: INTRAMEDULLARY (IM) NAIL FEMORAL;  Surgeon: Thornton Park, MD;  Location: [facility];  Service: Orthopedics;  Laterality: Left;  . HARDWARE REMOVAL Left 01/17/2017   Procedure: HARDWARE REMOVAL;  Surgeon: Thornton Park, MD;  Location: [facility];  Service: Orthopedics;  Laterality: Left;  . HERNIA REPAIR  2014   esophageal and gastric mesh. patient unable to throw up d/t mesh  . JOINT REPLACEMENT Bilateral    knees  . REPLACEMENT TOTAL KNEE BILATERAL Bilateral [id number]  . SHOULDER ARTHROSCOPY WITH OPEN ROTATOR CUFF REPAIR AND DISTAL CLAVICLE ACROMINECTOMY Left 10/25/2016   Procedure: SHOULDER ARTHROSCOPY WITH OPEN ROTATOR CUFF REPAIR AND DISTAL CLAVICLE ACROMINECTOMY;  Surgeon: Thornton Park, MD;  Location: [facility];  Service: Orthopedics;  Laterality: Left;  . THYROID SURGERY     goiter removed  . TOTAL SHOULDER REPLACEMENT Right 2012   Social History   Social History  . Marital status: Divorced    Spouse name: N/A  . Number of children: N/A  . Years of education: N/A   Social History Main Topics  . Smoking status: Never Smoker  . Smokeless tobacco: Never Used  . Alcohol use No  . Drug use: No  . Sexual activity: No   Other Topics Concern  . None   Social History Narrative  . None   Family History  Problem Relation Age of Onset  . Heart failure Mother   . Hypertension Mother   . Emphysema Father    No Known Allergies Prior to Admission medications   Medication Sig Start Date End Date Taking? Authorizing Provider  ADVAIR DISKUS 500-50 MCG/DOSE AEPB Inhale 1 puff into the lungs 2 (two) times daily. 07/18/16  Yes [provider]  amLODipine (NORVASC) 10 MG tablet Take 10 mg by mouth daily. 01/05/14  Yes [provider]  aspirin EC 325 MG tablet Take 1 tablet (325 mg total) by mouth daily. 01/18/17  Yes Thornton Park, MD  Cyanocobalamin (B-12) 2500 MCG TABS Take 2,500 mcg by mouth daily.   Yes [provider]  lisinopril (PRINIVIL,ZESTRIL) 10 MG tablet Take 10 mg by mouth daily.    Yes [provider]  Magnesium Oxide 500 MG CAPS Take 500 mg by mouth daily.   Yes [provider]  metFORMIN (GLUCOPHAGE) 500 MG tablet Take 1 tablet by mouth 2 (two) times daily. 06/23/14  Yes [provider]  Multiple Vitamin (MULTI-VITAMINS) TABS Take 1 tablet by mouth daily.   Yes [provider]  traZODone (DESYREL) 100 MG tablet Take 100 mg by mouth at bedtime.   Yes [provider]  Fluticasone-Salmeterol (ADVAIR DISKUS) 500-50 MCG/DOSE AEPB Inhale 1 puff into the lungs 2 (two) times daily. 10/05/14 02/01/16  [provider]  ondansetron (ZOFRAN) 4 MG tablet Take 1 tablet (4 mg total) by mouth every 8 (eight) hours as needed for nausea or vomiting. Patient not taking: Reported on 01/10/2017 10/25/16   Thornton Park, MD  oxyCODONE (OXY IR/ROXICODONE) 5 MG immediate release tablet Take 1-2 tablets (5-10 mg total) by mouth every 4 (four) hours as needed for breakthrough pain ((for MODERATE breakthrough pain)). 01/18/17   Thornton Park, MD  traMADol (ULTRAM) 50 MG tablet Take 50 mg by mouth daily.    [provider]   Dg Hip Unilat W Or Wo Pelvis 2-3 Views Left  Result Date: 01/25/2017 CLINICAL DATA:  Post scratched it recent screw removal from left hip. EXAM: DG HIP (WITH OR WITHOUT PELVIS) 2-3V LEFT COMPARISON:  02/04/2015. FINDINGS: New left femoral neck fracture. Left hip surgical screw again is been removed. Intramedullary rod and distal left femoral screw is in place. Left knee replacement again noted. No acute bony abnormality identified. 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 IMPRESSION: 1. New left femoral neck fracture. 2. Postsurgical changes left hip with prior removal of left hip screw. intramedullary rod and distal femoral screw in place. Electronically Signed   By: Marcello Moores   Register   On: 01/25/2017 13:38    Positive ROS: All other systems have been reviewed and were otherwise negative with the exception of those mentioned in the HPI and as above.  Physical Exam: General: Alert, no acute distress Cardiovascular: No pedal edema Respiratory: No cyanosis, no use of accessory musculature GI: No organomegaly, abdomen is soft and non-tender Skin: No lesions in the area of chief complaint Neurologic: Sensation intact distally Psychiatric: Patient is competent for consent with normal mood and affect Lymphatic: No axillary or cervical lymphadenopathy  MUSCULOSKELETAL: Patient is alert and cooperative, lying in bed.  The left leg is somewhat shortened and rotated.  There is pain with movement.  There is some bruising from her surgery last week.  2 small incisions have staples in place with no sign of infection.  Neurovascular status is good distally.  The skin  is otherwise intact.  No other injuries are noted.  Assessment: Subcapital fracture left hip Following removal of transfixion screw.  Plan: I plan to remove the long femoral rod.  I will replace the distal transfixion screw to avoid stress risers.  We will then convert her to a hemiarthroplasty.    Park Breed, MD [phone]   01/25/2017 5:49 PM

## 2017-01-26 ENCOUNTER — Inpatient Hospital Stay: Payer: Medicare Other | Admitting: Anesthesiology

## 2017-01-26 ENCOUNTER — Inpatient Hospital Stay: Payer: Medicare Other

## 2017-01-26 ENCOUNTER — Encounter
Admission: EM | Disposition: A | Discharge status: Skilled Nursing Facility | Payer: Self-pay | Source: Home / Self Care | Attending: Internal Medicine

## 2017-01-26 ENCOUNTER — Encounter: Payer: Self-pay | Admitting: Anesthesiology

## 2017-01-26 HISTORY — PX: HIP ARTHROPLASTY: SHX981

## 2017-01-26 LAB — HEMOGLOBIN A1C
Hgb A1c MFr Bld: 7 % — ABNORMAL HIGH (ref 4.8–5.6)
MEAN PLASMA GLUCOSE: 154 mg/dL

## 2017-01-26 LAB — BASIC METABOLIC PANEL
Anion gap: 6 (ref 5–15)
BUN: 33 mg/dL — AB (ref 6–20)
CALCIUM: 8.4 mg/dL — AB (ref 8.9–10.3)
CO2: 24 mmol/L (ref 22–32)
CREATININE: 1.24 mg/dL — AB (ref 0.44–1.00)
Chloride: 106 mmol/L (ref 101–111)
GFR calc Af Amer: 46 mL/min — ABNORMAL LOW (ref >60)
GFR, EST NON AFRICAN AMERICAN: 39 mL/min — AB (ref >60)
GLUCOSE: 139 mg/dL — AB (ref 65–99)
POTASSIUM: 4.3 mmol/L (ref 3.5–5.1)
SODIUM: 136 mmol/L (ref 135–145)

## 2017-01-26 LAB — GLUCOSE, CAPILLARY
GLUCOSE-CAPILLARY: 145 mg/dL — AB (ref 65–99)
GLUCOSE-CAPILLARY: 176 mg/dL — AB (ref 65–99)
Glucose-Capillary: 246 mg/dL — ABNORMAL HIGH (ref 65–99)

## 2017-01-26 LAB — CBC
HCT: 31.8 % — ABNORMAL LOW (ref 35.0–47.0)
Hemoglobin: 11.2 g/dL — ABNORMAL LOW (ref 12.0–16.0)
MCH: 31.2 pg (ref 26.0–34.0)
MCHC: 35.3 g/dL (ref 32.0–36.0)
MCV: 88.5 fL (ref 80.0–100.0)
PLATELETS: 203 10*3/uL (ref 150–440)
RBC: 3.59 MIL/uL — ABNORMAL LOW (ref 3.80–5.20)
RDW: 13.4 % (ref 11.5–14.5)
WBC: 6.2 10*3/uL (ref 3.6–11.0)

## 2017-01-26 SURGERY — HEMIARTHROPLASTY, HIP, DIRECT ANTERIOR APPROACH, FOR FRACTURE
Anesthesia: Spinal | Site: Hip | Laterality: Left

## 2017-01-26 MED ORDER — TRANEXAMIC ACID 1000 MG/10ML IV SOLN
1000.0000 mg | INTRAVENOUS | Status: AC
  Administered 2017-01-26: 1000 mg via INTRAVENOUS
  Filled 2017-01-26 (×2): qty 10

## 2017-01-26 MED ORDER — SODIUM CHLORIDE 0.9 % IV SOLN
INTRAVENOUS | Status: DC | PRN
  Administered 2017-01-26: 1000 mg via INTRAVENOUS

## 2017-01-26 MED ORDER — ZOLPIDEM TARTRATE 5 MG PO TABS
5.0000 mg | ORAL_TABLET | Freq: Every evening | ORAL | Status: DC | PRN
  Administered 2017-01-27: 5 mg via ORAL
  Filled 2017-01-26: qty 1

## 2017-01-26 MED ORDER — NEOMYCIN-POLYMYXIN B GU 40-200000 IR SOLN
Status: AC
  Filled 2017-01-26: qty 20

## 2017-01-26 MED ORDER — FENTANYL CITRATE (PF) 100 MCG/2ML IJ SOLN
INTRAMUSCULAR | Status: AC
  Administered 2017-01-26: 25 ug via INTRAVENOUS
  Filled 2017-01-26: qty 2

## 2017-01-26 MED ORDER — FENTANYL CITRATE (PF) 100 MCG/2ML IJ SOLN
25.0000 ug | INTRAMUSCULAR | Status: DC | PRN
  Administered 2017-01-26 (×6): 25 ug via INTRAVENOUS

## 2017-01-26 MED ORDER — ONDANSETRON HCL 4 MG/2ML IJ SOLN: 4.0000 mg | Freq: Four times a day (QID) | INTRAMUSCULAR | Status: DC | PRN

## 2017-01-26 MED ORDER — FLEET ENEMA 7-19 GM/118ML RE ENEM: 1.0000 | ENEMA | Freq: Once | RECTAL | Status: DC | PRN

## 2017-01-26 MED ORDER — FENTANYL CITRATE (PF) 100 MCG/2ML IJ SOLN
INTRAMUSCULAR | Status: AC
  Filled 2017-01-26: qty 2

## 2017-01-26 MED ORDER — VANCOMYCIN HCL 1000 MG IV SOLR
INTRAVENOUS | Status: DC | PRN
  Administered 2017-01-26: 1000 mg via INTRAVENOUS

## 2017-01-26 MED ORDER — PROPOFOL 10 MG/ML IV BOLUS
INTRAVENOUS | Status: DC | PRN
  Administered 2017-01-26: 10 mg via INTRAVENOUS
  Administered 2017-01-26: 20 mg via INTRAVENOUS

## 2017-01-26 MED ORDER — METHOCARBAMOL 1000 MG/10ML IJ SOLN
500.0000 mg | Freq: Four times a day (QID) | INTRAVENOUS | Status: DC | PRN
  Filled 2017-01-26: qty 5

## 2017-01-26 MED ORDER — BUPIVACAINE-EPINEPHRINE (PF) 0.25% -1:200000 IJ SOLN
INTRAMUSCULAR | Status: DC | PRN
  Administered 2017-01-26: 30 mL

## 2017-01-26 MED ORDER — BUPIVACAINE-EPINEPHRINE (PF) 0.25% -1:200000 IJ SOLN
INTRAMUSCULAR | Status: AC
  Filled 2017-01-26: qty 30

## 2017-01-26 MED ORDER — PHENOL 1.4 % MT LIQD
1.0000 | OROMUCOSAL | Status: DC | PRN
  Filled 2017-01-26: qty 177

## 2017-01-26 MED ORDER — BUPIVACAINE HCL (PF) 0.5 % IJ SOLN
INTRAMUSCULAR | Status: DC | PRN
  Administered 2017-01-26: 3 mL

## 2017-01-26 MED ORDER — BUPIVACAINE LIPOSOME 1.3 % IJ SUSP
INTRAMUSCULAR | Status: AC
  Filled 2017-01-26: qty 20

## 2017-01-26 MED ORDER — INSULIN ASPART 100 UNIT/ML ~~LOC~~ SOLN
0.0000 [IU] | Freq: Three times a day (TID) | SUBCUTANEOUS | Status: DC
  Administered 2017-01-26 – 2017-01-27 (×3): 2 [IU] via SUBCUTANEOUS
  Administered 2017-01-27: 1 [IU] via SUBCUTANEOUS
  Administered 2017-01-28: 2 [IU] via SUBCUTANEOUS
  Administered 2017-01-28: 1 [IU] via SUBCUTANEOUS
  Filled 2017-01-26 (×5): qty 1

## 2017-01-26 MED ORDER — ONDANSETRON HCL 4 MG/2ML IJ SOLN: 4.0000 mg | Freq: Once | INTRAMUSCULAR | Status: DC | PRN

## 2017-01-26 MED ORDER — PROPOFOL 500 MG/50ML IV EMUL
INTRAVENOUS | Status: AC
  Filled 2017-01-26: qty 50

## 2017-01-26 MED ORDER — SENNA 8.6 MG PO TABS
1.0000 | ORAL_TABLET | Freq: Two times a day (BID) | ORAL | Status: DC
  Administered 2017-01-26 – 2017-01-28 (×4): 8.6 mg via ORAL
  Filled 2017-01-26 (×4): qty 1

## 2017-01-26 MED ORDER — FENTANYL CITRATE (PF) 100 MCG/2ML IJ SOLN
INTRAMUSCULAR | Status: DC | PRN
  Administered 2017-01-26: 50 ug via INTRAVENOUS

## 2017-01-26 MED ORDER — HYDROMORPHONE HCL 1 MG/ML IJ SOLN
INTRAMUSCULAR | Status: AC
  Filled 2017-01-26: qty 1

## 2017-01-26 MED ORDER — MIDAZOLAM HCL 2 MG/2ML IJ SOLN
INTRAMUSCULAR | Status: AC
  Filled 2017-01-26: qty 2

## 2017-01-26 MED ORDER — LACTATED RINGERS IV SOLN
INTRAVENOUS | Status: DC | PRN
  Administered 2017-01-26 (×2): via INTRAVENOUS

## 2017-01-26 MED ORDER — METHOCARBAMOL 500 MG PO TABS
500.0000 mg | ORAL_TABLET | Freq: Four times a day (QID) | ORAL | Status: DC | PRN
  Administered 2017-01-27: 500 mg via ORAL
  Filled 2017-01-26: qty 1

## 2017-01-26 MED ORDER — SODIUM CHLORIDE 0.45 % IV SOLN
INTRAVENOUS | Status: DC
  Administered 2017-01-26: 14:00:00 via INTRAVENOUS
  Administered 2017-01-27: 75 mL/h via INTRAVENOUS

## 2017-01-26 MED ORDER — FERROUS SULFATE 325 (65 FE) MG PO TABS
325.0000 mg | ORAL_TABLET | Freq: Every day | ORAL | Status: DC
  Administered 2017-01-27 – 2017-01-28 (×2): 325 mg via ORAL
  Filled 2017-01-26 (×2): qty 1

## 2017-01-26 MED ORDER — VANCOMYCIN HCL IN DEXTROSE 1-5 GM/200ML-% IV SOLN
1000.0000 mg | INTRAVENOUS | Status: AC
  Administered 2017-01-26 – 2017-01-27 (×2): 1000 mg via INTRAVENOUS
  Filled 2017-01-26 (×2): qty 200

## 2017-01-26 MED ORDER — PHENYLEPHRINE HCL 10 MG/ML IJ SOLN
INTRAMUSCULAR | Status: DC | PRN
  Administered 2017-01-26: 50 ug via INTRAVENOUS

## 2017-01-26 MED ORDER — INSULIN ASPART 100 UNIT/ML ~~LOC~~ SOLN
0.0000 [IU] | Freq: Every day | SUBCUTANEOUS | Status: DC
  Administered 2017-01-26: 2 [IU] via SUBCUTANEOUS
  Filled 2017-01-26: qty 1

## 2017-01-26 MED ORDER — MENTHOL 3 MG MT LOZG
1.0000 | LOZENGE | OROMUCOSAL | Status: DC | PRN
  Filled 2017-01-26: qty 9

## 2017-01-26 MED ORDER — ACETAMINOPHEN 325 MG PO TABS
650.0000 mg | ORAL_TABLET | Freq: Four times a day (QID) | ORAL | Status: DC | PRN
  Administered 2017-01-27 (×2): 650 mg via ORAL
  Filled 2017-01-26 (×2): qty 2

## 2017-01-26 MED ORDER — HYDROMORPHONE HCL 1 MG/ML IJ SOLN
0.2500 mg | INTRAMUSCULAR | Status: DC | PRN
  Administered 2017-01-26 (×2): 0.25 mg via INTRAVENOUS

## 2017-01-26 MED ORDER — TRANEXAMIC ACID 1000 MG/10ML IV SOLN
INTRAVENOUS | Status: AC
  Filled 2017-01-26: qty 10

## 2017-01-26 MED ORDER — BISACODYL 10 MG RE SUPP
10.0000 mg | Freq: Every day | RECTAL | Status: DC | PRN
  Administered 2017-01-28: 10 mg via RECTAL
  Filled 2017-01-26: qty 1

## 2017-01-26 MED ORDER — MORPHINE SULFATE (PF) 2 MG/ML IV SOLN
0.5000 mg | INTRAVENOUS | Status: DC | PRN
  Administered 2017-01-26 (×3): 0.5 mg via INTRAVENOUS
  Filled 2017-01-26 (×3): qty 1

## 2017-01-26 MED ORDER — METOCLOPRAMIDE HCL 5 MG/ML IJ SOLN: 5.0000 mg | Freq: Three times a day (TID) | INTRAMUSCULAR | Status: DC | PRN

## 2017-01-26 MED ORDER — NEOMYCIN-POLYMYXIN B GU 40-200000 IR SOLN
Status: DC | PRN
  Administered 2017-01-26: 8 mL

## 2017-01-26 MED ORDER — ENOXAPARIN SODIUM 30 MG/0.3ML ~~LOC~~ SOLN
30.0000 mg | SUBCUTANEOUS | Status: DC
  Administered 2017-01-27 – 2017-01-28 (×2): 30 mg via SUBCUTANEOUS
  Filled 2017-01-26 (×2): qty 0.3

## 2017-01-26 MED ORDER — ONDANSETRON HCL 4 MG PO TABS: 4.0000 mg | ORAL_TABLET | Freq: Four times a day (QID) | ORAL | Status: DC | PRN

## 2017-01-26 MED ORDER — ALUM & MAG HYDROXIDE-SIMETH 200-200-20 MG/5ML PO SUSP: 30.0000 mL | ORAL | Status: DC | PRN

## 2017-01-26 MED ORDER — METOCLOPRAMIDE HCL 10 MG PO TABS: 5.0000 mg | ORAL_TABLET | Freq: Three times a day (TID) | ORAL | Status: DC | PRN

## 2017-01-26 MED ORDER — INSULIN ASPART 100 UNIT/ML ~~LOC~~ SOLN: 0.0000 [IU] | Freq: Every day | SUBCUTANEOUS | Status: DC

## 2017-01-26 MED ORDER — HYDROCODONE-ACETAMINOPHEN 5-325 MG PO TABS
1.0000 | ORAL_TABLET | Freq: Four times a day (QID) | ORAL | Status: DC | PRN
  Administered 2017-01-26 – 2017-01-27 (×4): 2 via ORAL
  Administered 2017-01-28: 1 via ORAL
  Filled 2017-01-26: qty 2
  Filled 2017-01-26: qty 1
  Filled 2017-01-26 (×3): qty 2

## 2017-01-26 MED ORDER — ACETAMINOPHEN 650 MG RE SUPP: 650.0000 mg | Freq: Four times a day (QID) | RECTAL | Status: DC | PRN

## 2017-01-26 MED ORDER — INSULIN ASPART 100 UNIT/ML ~~LOC~~ SOLN: 0.0000 [IU] | Freq: Three times a day (TID) | SUBCUTANEOUS | Status: DC

## 2017-01-26 MED ORDER — PROPOFOL 500 MG/50ML IV EMUL
INTRAVENOUS | Status: DC | PRN
  Administered 2017-01-26: 50 ug/kg/min via INTRAVENOUS

## 2017-01-26 MED ORDER — MIDAZOLAM HCL 5 MG/5ML IJ SOLN
INTRAMUSCULAR | Status: DC | PRN
  Administered 2017-01-26 (×2): 1 mg via INTRAVENOUS

## 2017-01-26 MED ORDER — SODIUM CHLORIDE 0.9 % IJ SOLN
INTRAMUSCULAR | Status: AC
Start: 2017-01-26 — End: 2017-01-26
  Filled 2017-01-26: qty 50

## 2017-01-26 MED ORDER — EPHEDRINE SULFATE 50 MG/ML IJ SOLN
INTRAMUSCULAR | Status: DC | PRN
  Administered 2017-01-26: 10 mg via INTRAVENOUS

## 2017-01-26 SURGICAL SUPPLY — 57 items
BAG COUNTER SPONGE EZ (MISCELLANEOUS) IMPLANT
BLADE DEBAKEY 8.0 (BLADE) ×2 IMPLANT
BLADE DEBAKEY 8.0MM (BLADE) ×1
BLADE SAGITTAL WIDE XTHICK NO (BLADE) ×3 IMPLANT
BLADE SURG SZ10 CARB STEEL (BLADE) ×3 IMPLANT
CANISTER SUCT 1200ML W/VALVE (MISCELLANEOUS) ×9 IMPLANT
CAPT HIP HEMI 1 ×3 IMPLANT
CHLORAPREP W/TINT 26ML (MISCELLANEOUS) ×6 IMPLANT
COUNTER SPONGE BAG EZ (MISCELLANEOUS)
DRAPE INCISE IOBAN 66X60 STRL (DRAPES) ×6 IMPLANT
DRAPE SHEET LG 3/4 BI-LAMINATE (DRAPES) ×12 IMPLANT
DRAPE TABLE BACK 80X90 (DRAPES) ×3 IMPLANT
DRSG AQUACEL AG ADV 3.5X 4 (GAUZE/BANDAGES/DRESSINGS) ×3 IMPLANT
DRSG AQUACEL AG ADV 3.5X10 (GAUZE/BANDAGES/DRESSINGS) ×3 IMPLANT
DRSG AQUACEL AG ADV 3.5X14 (GAUZE/BANDAGES/DRESSINGS) ×3 IMPLANT
ELECT BLADE 6.5 EXT (BLADE) ×3 IMPLANT
ELECT CAUTERY BLADE 6.4 (BLADE) ×3 IMPLANT
ELECT REM PT RETURN 9FT ADLT (ELECTROSURGICAL) ×3
ELECTRODE REM PT RTRN 9FT ADLT (ELECTROSURGICAL) ×1 IMPLANT
GAUZE PETRO XEROFOAM 1X8 (MISCELLANEOUS) IMPLANT
GAUZE SPONGE 4X4 12PLY STRL (GAUZE/BANDAGES/DRESSINGS) ×3 IMPLANT
GLOVE INDICATOR 8.0 STRL GRN (GLOVE) ×3 IMPLANT
GLOVE SURG ORTHO 8.5 STRL (GLOVE) ×12 IMPLANT
GOWN STRL REUS W/ TWL LRG LVL3 (GOWN DISPOSABLE) ×2 IMPLANT
GOWN STRL REUS W/TWL LRG LVL3 (GOWN DISPOSABLE) ×4
GOWN STRL REUS W/TWL LRG LVL4 (GOWN DISPOSABLE) ×3 IMPLANT
HEMOVAC 400CC 10FR (MISCELLANEOUS) ×3 IMPLANT
HIP CAPITATED HEMI 1 ×1 IMPLANT
IV NS 1000ML (IV SOLUTION) ×2
IV NS 1000ML BAXH (IV SOLUTION) ×1 IMPLANT
KIT RM TURNOVER STRD PROC AR (KITS) ×3 IMPLANT
LOOP VESSEL SUPERMAXI WHITE (MISCELLANEOUS) ×3 IMPLANT
NEEDLE FILTER BLUNT 18X 1/2SAF (NEEDLE) ×2
NEEDLE FILTER BLUNT 18X1 1/2 (NEEDLE) ×1 IMPLANT
NEEDLE MAYO CATGUT SZ4 (NEEDLE) ×3 IMPLANT
NEEDLE SPNL 18GX3.5 QUINCKE PK (NEEDLE) ×3 IMPLANT
NS IRRIG 1000ML POUR BTL (IV SOLUTION) ×3 IMPLANT
PACK HIP PROSTHESIS (MISCELLANEOUS) ×3 IMPLANT
PAD ABD DERMACEA PRESS 5X9 (GAUZE/BANDAGES/DRESSINGS) IMPLANT
PULSAVAC PLUS IRRIG FAN TIP (DISPOSABLE) ×3
RETRIEVER SUT HEWSON (MISCELLANEOUS) ×3 IMPLANT
SOL PREP PVP 2OZ (MISCELLANEOUS) ×3
SOLUTION PREP PVP 2OZ (MISCELLANEOUS) ×1 IMPLANT
SPONGE LAP 18X18 5 PK (GAUZE/BANDAGES/DRESSINGS) ×3 IMPLANT
STAPLER SKIN PROX 35W (STAPLE) ×3 IMPLANT
SUT DVC 2 QUILL PDO  T11 36X36 (SUTURE) ×4
SUT DVC 2 QUILL PDO T11 36X36 (SUTURE) ×2 IMPLANT
SUT QUILL PDO 0 36 36 VIOLET (SUTURE) ×3 IMPLANT
SUT TICRON 2-0 30IN 311381 (SUTURE) ×15 IMPLANT
SYR 30ML LL (SYRINGE) ×3 IMPLANT
SYR 50ML LL SCALE MARK (SYRINGE) ×3 IMPLANT
SYRINGE 10CC LL (SYRINGE) ×3 IMPLANT
TAPE MICROFOAM 4IN (TAPE) IMPLANT
TIP BRUSH PULSAVAC PLUS 24.33 (MISCELLANEOUS) ×3 IMPLANT
TIP FAN IRRIG PULSAVAC PLUS (DISPOSABLE) ×1 IMPLANT
TUBE SUCT KAM VAC (TUBING) ×3 IMPLANT
WATER STERILE IRR 1000ML POUR (IV SOLUTION) ×3 IMPLANT

## 2017-01-26 NOTE — Progress Notes (Signed)
[place] [place] NAME: Leslie Parker    MR#:  [id number]  DATE OF BIRTH:  [birth year]  SUBJECTIVE:  CHIEF COMPLAINT:   Chief Complaint  Patient presents with  . Hip Pain    Recent surgery on left hip, came again with hip fracture, s/p surgery today, no complains, tolerated diet.  REVIEW OF SYSTEMS:  CONSTITUTIONAL: No fever, fatigue or weakness.  EYES: No blurred or double vision.  EARS, NOSE, AND THROAT: No tinnitus or ear pain.  RESPIRATORY: No cough, shortness of breath, wheezing or hemoptysis.  CARDIOVASCULAR: No chest pain, orthopnea, edema.  GASTROINTESTINAL: No nausea, vomiting, diarrhea or abdominal pain.  GENITOURINARY: No dysuria, hematuria.  ENDOCRINE: No polyuria, nocturia,  HEMATOLOGY: No anemia, easy bruising or bleeding SKIN: No rash or lesion. MUSCULOSKELETAL: No joint pain or arthritis.   NEUROLOGIC: No tingling, numbness, weakness.  PSYCHIATRY: No anxiety or depression.   ROS  DRUG ALLERGIES:  No Known Allergies  VITALS:  Blood pressure (!) 141/85, pulse 85, temperature 97.8 F (36.6 C), resp. rate 12, height 5\' 9"  (1.753 m), weight 74.4 kg (164 lb 1.6 oz), SpO2 99 %.  PHYSICAL EXAMINATION:  GENERAL:  83 y.o.-year-old patient lying in the bed with no acute distress.  EYES: Pupils equal, round, reactive to light and accommodation. No scleral icterus. Extraocular muscles intact.  HEENT: Head atraumatic, normocephalic. Oropharynx and nasopharynx clear.  NECK:  Supple, no jugular venous distention. No thyroid enlargement, no tenderness.  LUNGS: Normal breath sounds bilaterally, no wheezing, rales,rhonchi or crepitation. No use of accessory muscles of respiration.  CARDIOVASCULAR: S1, S2 normal. No murmurs, rubs, or gallops.  ABDOMEN: Soft, nontender, nondistended. Bowel sounds present. No organomegaly or mass.  EXTREMITIES: No pedal edema, cyanosis, or clubbing.  NEUROLOGIC: Cranial nerves II through XII are intact.  Muscle strength 5/5 in all extremities, except left LL, which is s/p surgery and still have weights as advised by orthopedic. Sensation intact. Gait not checked.  PSYCHIATRIC: The patient is alert and oriented x 3.  SKIN: No obvious rash, lesion, or ulcer.   Physical Exam LABORATORY PANEL:   CBC  Recent Labs Lab 01/26/17 0345  WBC 6.2  HGB 11.2*  HCT 31.8*  PLT 203   ------------------------------------------------------------------------------------------------------------------  Chemistries   Recent Labs Lab 01/26/17 0345  NA 136  K 4.3  CL 106  CO2 24  GLUCOSE 139*  BUN 33*  CREATININE 1.24*  CALCIUM 8.4*   ------------------------------------------------------------------------------------------------------------------  Cardiac Enzymes No results for input(s): TROPONINI in the last 168 hours. ------------------------------------------------------------------------------------------------------------------  RADIOLOGY:  Pelvis Portable  Result Date: 01/26/2017 CLINICAL DATA:  Patient status post left hip arthroplasty. EXAM: PORTABLE PELVIS 1-2 VIEWS COMPARISON:  CT pelvis 01/02/2017. FINDINGS: Interval left hip arthroplasty. Hardware appears intact. Postsurgical changes within the overlying soft tissues. IMPRESSION: Patient status post interval left hip arthroplasty. Electronically Signed   By: Lovey Newcomer M.D.   On: 01/26/2017 13:48   Dg Hip Unilat With Pelvis 1v Left  Result Date: 01/26/2017 CLINICAL DATA:  Intraoperative left hip hemiarthroplasty assessment EXAM: DG HIP (WITH OR WITHOUT PELVIS) 1V*L* COMPARISON:  01/26/2017 at 11:26 a.m. FINDINGS: Part of the left hip hemiarthroplasty is in place, including a collar like ring, and the smoothly marginated stem. Bony lucencies in the greater trochanter and lateral sub creek trochanteric region related to original hardware. Tissue spreaders in place. IMPRESSION: 1. Intraoperative assessment during hemiarthroplasty  placement, as noted above. Electronically Signed   By: Cindra Eves.D.  On: 01/26/2017 12:19   Dg Hip Unilat With Pelvis 1v Left  Result Date: 01/26/2017 CLINICAL DATA:  Left hip hemiarthroplasty after IM nail removal. EXAM: DG HIP (WITH OR WITHOUT PELVIS) 1V*L* COMPARISON:  Earlier the same day FINDINGS: 1126 hours. Sizing component noted for femoral prosthesis in this patient undergoing total hip replacement. Surgical hardware noted on this intraoperative film. IMPRESSION: Intraoperative assessment during left hip replacement. Electronically Signed   By: Misty Stanley M.D.   On: 01/26/2017 12:15   Dg Hip Unilat With Pelvis 1v Left  Result Date: 01/26/2017 CLINICAL DATA:  Intraoperative assessment of left hip hemiarthroplasty and prior IM nail removal. EXAM: DG HIP (WITH OR WITHOUT PELVIS) 1V*L* COMPARISON:  01/26/2017 at 10:31 a.m. FINDINGS: Serrated margins spacer or rasp observed in the proximal femur, in preparation for hemiarthroplasty placement. Femoral head absent. Lucency in the greater trochanter related to original IM nail. I do not observe an acute fracture. The femur is somewhat proximal to its expected orientation with the acetabulum. The ring and a wider proximal stem along the spacer are no longer seen. Bony demineralization. IMPRESSION: 1. Intraoperative view of the left hip during hemiarthroplasty as detailed above. Electronically Signed   By: Van Clines M.D.   On: 01/26/2017 12:08   Dg Hip Unilat With Pelvis 1v Left  Result Date: 01/26/2017 CLINICAL DATA:  Left hip hemiarthroplasty after IM nail removal. EXAM: DG HIP (WITH OR WITHOUT PELVIS) 1V*L* COMPARISON:  01/25/2017 FINDINGS: 1031 hours. IM nail seen on the previous study has been a tree. Sizing femoral component noted over the proximal femur. Gas in the soft tissues compatible with the intraoperative film. IMPRESSION: Intraoperative evaluation during left hip replacement. Electronically Signed   By: Misty Stanley  M.D.   On: 01/26/2017 12:02   Dg Hip Unilat W Or Wo Pelvis 2-3 Views Left  Result Date: 01/25/2017 CLINICAL DATA:  Post scratched it recent screw removal from left hip. EXAM: DG HIP (WITH OR WITHOUT PELVIS) 2-3V LEFT COMPARISON:  02/04/2015. FINDINGS: New left femoral neck fracture. Left hip surgical screw again is been removed. Intramedullary rod and distal left femoral screw is in place. Left knee replacement again noted. No acute bony abnormality identified. 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 IMPRESSION: 1. New left femoral neck fracture. 2. Postsurgical changes left hip with prior removal of left hip screw. intramedullary rod and distal femoral screw in place. Electronically Signed   By: Marcello Moores  Register   On: 01/25/2017 13:38    ASSESSMENT AND PLAN:   Active Problems:   Closed left hip fracture (HCC)  * Left femoral neck fracture.  S/p revision hemiarthroplasty, DVT prophylaxis per orthopedics. Pain medications added as needed.  Incentive spirometer  * Diabetes mellitus.  sliding scale insulin.  * Hypertension. Continue home medications. IV when necessary medications added.  * CKD stage III is stable     All the records are reviewed and case discussed with Care Management/Social Workerr. Management plans discussed with the patient, family and they are in agreement.  CODE STATUS: full  TOTAL TIME TAKING CARE OF THIS PATIENT: 35 minutes.   Pt's daughter, sin and grand daughter were in room during my visit.  POSSIBLE D/C IN 1-2 DAYS, DEPENDING ON CLINICAL CONDITION.   Vaughan Basta M.D on 01/26/2017   Between 7am to 6pm - Pager - [phone]  After 6pm go to [email or web address] - Proofreader  [employer]  [phone]  CC: Primary care physician; Patient, No Pcp Per  Note: This dictation was prepared with Dragon dictation along with smaller phrase technology. Any  transcriptional errors that result from this process are unintentional.

## 2017-01-26 NOTE — Transfer of Care (Signed)
Immediate Anesthesia Transfer of Care Note  Patient: Leslie Parker  Procedure(s) Performed: Procedure(s): ARTHROPLASTY BIPOLAR HIP (HEMIARTHROPLASTY) removal hardware left hip (Left)  Patient Location: PACU  Anesthesia Type:Spinal  Level of Consciousness: awake, alert  and oriented  Airway & Oxygen Therapy: Patient Spontanous Breathing and Patient connected to face mask oxygen  Post-op Assessment: Report given to RN  Post vital signs: Reviewed and stable  Last Vitals:  Vitals:   01/26/17 0723 01/26/17 1212  BP: 123/65 (!) 120/99  Pulse: 75 89  Resp: 18 10  Temp: 37.3 C 37 C    Last Pain:  Vitals:   01/26/17 0723  TempSrc: Oral  PainSc: 0-No pain      Patients Stated Pain Goal: 2 (97/58/83 2549)  Complications: No apparent anesthesia complications

## 2017-01-26 NOTE — H&P (Signed)
THE PATIENT WAS SEEN PRIOR TO SURGERY TODAY.  HISTORY, ALLERGIES, HOME MEDICATIONS AND OPERATIVE PROCEDURE WERE REVIEWED. RISKS AND BENEFITS OF SURGERY DISCUSSED WITH PATIENT AGAIN.  NO CHANGES FROM INITIAL HISTORY AND PHYSICAL NOTED.    

## 2017-01-26 NOTE — Anesthesia Preprocedure Evaluation (Addendum)
Anesthesia Evaluation  Patient identified by MRN, date of birth, ID band Patient awake    Reviewed: Allergy & Precautions, H&P , NPO status , Patient's Chart, lab work & pertinent test results  History of Anesthesia Complications Negative for: history of anesthetic complications  Airway Mallampati: III  TM Distance: <3 FB Neck ROM: limited    Dental  (+) Poor Dentition, Missing, Upper Dentures, Lower Dentures   Pulmonary shortness of breath and with exertion, asthma , COPD,           Cardiovascular Exercise Tolerance: Good hypertension, (-) angina(-) Past MI and (-) DOE + Valvular Problems/Murmurs      Neuro/Psych negative neurological ROS  negative psych ROS   GI/Hepatic negative GI ROS, Neg liver ROS, neg GERD  ,  Endo/Other  diabetes, Type 2  Renal/GU      Musculoskeletal  (+) Arthritis ,   Abdominal   Peds  Hematology negative hematology ROS (+)   Anesthesia Other Findings Past Medical History: No date: Asthma No date: Cancer ([place])     Comment: Basal Cell No date: Diabetes mellitus without complication (HCC) No date: Heart murmur No date: Hypertension No date: Impetigo No date: Osteoarthritis No date: Osteopenia No date: Wears dentures     Comment: full upper and lower  Past Surgical History: No date: ABDOMINAL HYSTERECTOMY No date: BLADDER SURGERY     Comment: mesh No date: CARPAL TUNNEL RELEASE Bilateral 2017: CATARACT EXTRACTION Right 02/06/2016: CATARACT EXTRACTION W/PHACO Left     Comment: Procedure: CATARACT EXTRACTION PHACO AND               INTRAOCULAR LENS PLACEMENT ([place]) left eye;                Surgeon: Ronnell Freshwater, MD;                Location: [place];  Service:               Ophthalmology;  Laterality: Left;                DIABETIC LEFT Cannot arrive before 9:30 No date: CERVICAL DISC SURGERY No date: CHOLECYSTECTOMY No date: EYE SURGERY Bilateral  Comment: Cataract Extraction with IOL 02/04/2015: FEMUR IM NAIL Left     Comment: Procedure: INTRAMEDULLARY (IM) NAIL FEMORAL;                Surgeon: Thornton Park, MD;  Location: [facility]               [facility];  Service: Orthopedics;  Laterality: Left; 2014: HERNIA REPAIR     Comment: esophageal and gastric mesh. patient unable to              throw up d/t mesh No date: JOINT REPLACEMENT Bilateral     Comment: knees 2007,2008: REPLACEMENT TOTAL KNEE BILATERAL Bilateral 10/25/2016: SHOULDER ARTHROSCOPY WITH OPEN ROTATOR CUFF RE* Left     Comment: Procedure: SHOULDER ARTHROSCOPY WITH OPEN               ROTATOR CUFF REPAIR AND DISTAL CLAVICLE               ACROMINECTOMY;  Surgeon: Thornton Park, MD;                Location: [facility];  Service: Orthopedics;                Laterality: Left; No date: THYROID SURGERY     Comment: goiter removed 2012: TOTAL  SHOULDER REPLACEMENT Right  BMI    Body Mass Index:  24.37 kg/m      Reproductive/Obstetrics negative OB ROS                             Anesthesia Physical  Anesthesia Plan  ASA: III and emergent  Anesthesia Plan: Spinal   Post-op Pain Management:    Induction: Intravenous  PONV Risk Score and Plan: 3 and Ondansetron, Dexamethasone and Propofol  Airway Management Planned:   Additional Equipment:   Intra-op Plan:   Post-operative Plan:   Informed Consent: I have reviewed the patients History and Physical, chart, labs and discussed the procedure including the risks, benefits and alternatives for the proposed anesthesia with the patient or authorized representative who has indicated his/her understanding and acceptance.   Dental Advisory Given  Plan Discussed with: Anesthesiologist, CRNA and Surgeon  Anesthesia Plan Comments: (Patient consented for risks of anesthesia including but not limited to:  - adverse reactions to medications - damage to teeth, lips or other oral mucosa - sore throat or  hoarseness - Damage to heart, brain, lungs or loss of life  Patient voiced understanding.)       Anesthesia Quick Evaluation

## 2017-01-26 NOTE — Clinical Social Work Note (Signed)
CSW received consult for nursing home placement. CSW will follow pending PT recommendations.  Santiago Bumpers, MSW, Latanya Presser [phone]

## 2017-01-26 NOTE — Progress Notes (Signed)
Pt for surgery today at 0800. Report given to Ivin Booty, [place] RN via phone, aware that due Vancomycin IV will be sent with the pt to surgery. Consent signed by pt last night, foley catheter in place and emptied. Dentures removed and placed in a denture cup.

## 2017-01-26 NOTE — Op Note (Signed)
01/25/2017 - 01/26/2017  12:07 PM  PATIENT:  Leslie Parker   MRN: [id number]  PRE-OPERATIVE DIAGNOSIS:  Displaced Subcapital fracture left hip   POST-OPERATIVE DIAGNOSIS: Same with retained femoral rod in femur  PROCEDURE: 1) Revision left hip hemiarthroplasty with Stryker Accolade prosthesis. 2) removal of femoral rod, left femur  PREOPERATIVE INDICATIONS:  Leslie Parker is an 83 y.o. female who was admitted 01/25/2017 with a diagnosis of displaced subcapital fracture of the hip and elected for surgical management.  She had a lag screw from her  Affixus nail removed a week ago.  The femoral neck.  Subsequently fractured causing the need for repeat surgery.  The risks benefits and alternatives were discussed with the patient including but not limited to the risks of nonoperative treatment, versus surgical intervention including infection, bleeding, nerve injury, periprosthetic fracture, the need for revision surgery, dislocation, leg length discrepancy, blood clots, cardiopulmonary complications, morbidity, mortality, among others, and they were willing to proceed.  Predicted outcome is good, although there will be at least a six to nine month expected recovery.     SURGEON:  Earnestine Leys, MD    ANESTHESIA: Spinal    COMPLICATIONS:  None.   EBL:  500 cc    COMPONENTS:  Stryker Accolade Femoral Fracture stem size # 5  ,   and a size   48 mm  fracture head unipolar hip ball with    -4 mm  neck length.    PROCEDURE IN DETAIL: The patient was met in the holding area and identified.  The appropriate hip  was marked at the operative site. The patient was then transported to the OR and  placed under general anesthesia.  At that point, the patient was  placed in the lateral decubitus position with the operative side up and  secured to the operating room table and all bony prominences padded.     The operative lower extremity was prepped from the iliac crest to the toes.  Sterile draping was performed.   Time out was performed prior to incision.      A routine posterolateral approach was utilized via sharp dissection  carried down to the subcutaneous tissue.  Gross bleeders were Bovie  coagulated.  The iliotibial band was identified and incised  along the length of the skin incision.  Self-retaining retractors were  inserted.  With the hip internally rotated, the tip of the greater trochanter was identified and the tip of the femoral rod was cleared of all soft tissue debris.  Action device was then screwed into the nail.  A small incision was made distally over the distal locking screw.  Dissection was carried out sharply down to the screw which was cleared of all debris.  The screw was then backed out to allow for removal of the rod.  Using a slap hammer.  Gently, the rod was easily removed.  The distal locking screw was replaced in its original site and seated snugly to avoid stress risers.  The tip of the greater trochanter was seen to be healed in a fibrous manner.  Dissection was carried out bluntly posteriorly because of extensive scar.  Sciatic nerve was identified proximally and followed distally and then tagged with a large vessel loop drain.  The posterior capsule was then identified and incised.  The femoral neck was exposed by   gently internally rotating the leg.  The neck was then cut with an oscillating saw.  The femoral head was removed.  It measured  48 mm in size.  A trial 48 mm head was inserted and was very snug and stable.  This was removed.  The femoral canal was then carefully opened using a Charnley T-handled reamer.  Curettes were used to remove some bone proximally.  The canal was then sequentially enlarged using Accolade rasps up to a #5 rasp.  X-rays were taken and secured several times to assess the position of the rasp and filling of the proximal canal.  The hard bridge of bone proximally which had been around the rod was gradually pushed laterally.  Very stable proximal fit was  obtained with the #5 rasp.  The hip was then reduced and using a -4 mm neck length.  X-rays showed good alignment with this construction.  The trials removed and the lateral hole proximal femur was bone grafted.  A #5 active Stryker Accolade stem was then inserted and seated very snugly.  The 48 mm unipolar head with a -4 neck length was attached and was reduced.  Leg lengths were excellent.  Stability was good.  Posterior capsule was then repaired with #2 Tycron sutures.  There was a very thick posterior capsule.  A Dall-Miles cable was passed around the neck of the femur and tightened around the trochanter to prevent separation.  It was crimped and excess cable removed.  The tip of the greater trochanter was quite loose and was then repaired to the body of the trochanter using multiple #5 FiberWire sutures through drill holes in the bone.  The hip appeared to be stable at this point and after multiple irrigations throughout the procedure the lateral fascia was closed with #2 Quill suture over a Hemovac drain.  Subcutaneous cutaneous tissue was closed with 0 Quill over another Hemovac drain.  The skin was closed with staples.  A small distal incision was closed with 2-0 Vicryl and staples.  Aquacel dressings were applied.  The patient was carefully transferred to her hospital bed.  The hip was stable.  Leg lengths were excellent.  The patient was taken to PACU in good condition.     Park Breed, MD Orthopedic Surgeon [phone]   01/26/2017 12:07 PM

## 2017-01-26 NOTE — Anesthesia Post-op Follow-up Note (Cosign Needed)
Anesthesia QCDR form completed.        

## 2017-01-26 NOTE — Anesthesia Procedure Notes (Signed)
Spinal  Patient location during procedure: OR Start time: 01/26/2017 8:21 AM End time: 01/26/2017 8:25 AM Staffing Anesthesiologist: Alvin Critchley Performed: anesthesiologist  Preanesthetic Checklist Completed: patient identified, site marked, surgical consent, pre-op evaluation, timeout performed, IV checked, risks and benefits discussed and monitors and equipment checked Spinal Block Patient position: sitting Prep: Betadine and site prepped and draped Patient monitoring: heart rate, cardiac monitor, continuous pulse ox and blood pressure Approach: right paramedian Location: L3-4 Injection technique: single-shot Needle Needle type: Quincke  Needle gauge: 25 G Needle length: 9 cm Assessment Sensory level: T8 Additional Notes Time out called.  Patient sedated and then placed in sitting position.  Back prepped and draped in sterile fashion.  A skin wheal was made in the R paramedian position in the L3-L4 interspace with 1% Lidocaine plain.  A 25G Quincke needle was advanced with the return of clear, colorless CSF in all 4 quadrants.  No blood or paresthesias noted.  The patient tolerated the procedure well.  16mg  of marcaine + epi wash was injected.

## 2017-01-27 LAB — BASIC METABOLIC PANEL
Anion gap: 3 — ABNORMAL LOW (ref 5–15)
BUN: 24 mg/dL — ABNORMAL HIGH (ref 6–20)
CALCIUM: 8 mg/dL — AB (ref 8.9–10.3)
CO2: 24 mmol/L (ref 22–32)
CREATININE: 0.9 mg/dL (ref 0.44–1.00)
Chloride: 105 mmol/L (ref 101–111)
GFR calc Af Amer: >60 mL/min (ref >60)
GFR calc non Af Amer: 58 mL/min — ABNORMAL LOW (ref >60)
GLUCOSE: 157 mg/dL — AB (ref 65–99)
Potassium: 4.3 mmol/L (ref 3.5–5.1)
Sodium: 132 mmol/L — ABNORMAL LOW (ref 135–145)

## 2017-01-27 LAB — CBC
HCT: 26.7 % — ABNORMAL LOW (ref 35.0–47.0)
Hemoglobin: 9.5 g/dL — ABNORMAL LOW (ref 12.0–16.0)
MCH: 31.4 pg (ref 26.0–34.0)
MCHC: 35.4 g/dL (ref 32.0–36.0)
MCV: 88.5 fL (ref 80.0–100.0)
Platelets: 173 10*3/uL (ref 150–440)
RBC: 3.02 MIL/uL — ABNORMAL LOW (ref 3.80–5.20)
RDW: 13.5 % (ref 11.5–14.5)
WBC: 6.1 10*3/uL (ref 3.6–11.0)

## 2017-01-27 LAB — GLUCOSE, CAPILLARY
GLUCOSE-CAPILLARY: 143 mg/dL — AB (ref 65–99)
GLUCOSE-CAPILLARY: 160 mg/dL — AB (ref 65–99)
Glucose-Capillary: 171 mg/dL — ABNORMAL HIGH (ref 65–99)
Glucose-Capillary: 158 mg/dL — ABNORMAL HIGH (ref 65–99)

## 2017-01-27 MED ORDER — MAGNESIUM HYDROXIDE 400 MG/5ML PO SUSP
30.0000 mL | Freq: Every day | ORAL | Status: DC | PRN
  Administered 2017-01-27 – 2017-01-28 (×2): 30 mL via ORAL
  Filled 2017-01-27: qty 30

## 2017-01-27 MED ORDER — TRAMADOL HCL 50 MG PO TABS
50.0000 mg | ORAL_TABLET | Freq: Four times a day (QID) | ORAL | Status: DC | PRN
  Administered 2017-01-27 – 2017-01-28 (×3): 50 mg via ORAL
  Filled 2017-01-27 (×3): qty 1

## 2017-01-27 NOTE — Anesthesia Postprocedure Evaluation (Signed)
Anesthesia Post Note  Patient: Leslie Parker  Procedure(s) Performed: Procedure(s) (LRB): ARTHROPLASTY BIPOLAR HIP (HEMIARTHROPLASTY) removal hardware left hip (Left)  Patient location during evaluation: Other Anesthesia Type: Spinal Level of consciousness: awake and alert and oriented Pain management: pain level controlled Vital Signs Assessment: post-procedure vital signs reviewed and stable Respiratory status: spontaneous breathing and respiratory function stable Cardiovascular status: blood pressure returned to baseline Postop Assessment: no headache and no backache Anesthetic complications: no     Last Vitals:  Vitals:   01/27/17 0803 01/27/17 1401  BP: (!) 147/50 (!) 160/74  Pulse: 60 100  Resp: 16 18  Temp: 36.8 C 36.9 C    Last Pain:  Vitals:   01/27/17 2103  TempSrc:   PainSc: 6                  Leslie Parker

## 2017-01-27 NOTE — Progress Notes (Signed)
Physical Therapy Evaluation Patient Details Name: Leslie Parker MRN: [id number] DOB: Nov 22, 1932 Today's Date: 01/27/2017   History of Present Illness  Patient is an 83 y.o. female admitted on 06 JUL after having increased L hip pain. Patient had L hip lag screw removed after L intertrochanteric hip fx was repaired in 2016. Revision of L hip hemiarthroplasty performed on 07 JUL by Dr. Sabra Heck. PMH includes HTN and DMII. Patient also reports having L rotator cuff surgery for which she was undergoing OP PT.  Clinical Impression  Patient is a pleasant female admitted for above listed reasons. Patient previously modified independent in apartment with assistive devices. Upon evaluation, patient demonstrates need for moderate assistance to +2 with bed mobility and experiences difficulty maintaining 25% WB precautions in standing. Patient unable to perform hop to gait at this time; however, she can perform R LE heel raises with bilateral UE support. Patient will continue to benefit from skilled and progressive PT to return to [facility] and will require f/u at SNF upon d/c when medically ready.    Follow Up Recommendations SNF    Equipment Recommendations  None recommended by PT    Recommendations for Other Services       Precautions / Restrictions Precautions Precautions: Fall;Posterior Hip Required Braces or Orthoses: Other Brace/Splint (bucks traction) Restrictions Weight Bearing Restrictions: Yes LLE Weight Bearing: Partial weight bearing LLE Partial Weight Bearing Percentage or Pounds: 25      Mobility  Bed Mobility Overal bed mobility: Needs Assistance Bed Mobility: Supine to Sit;Sit to Supine     Supine to sit: Mod assist Sit to supine: Mod assist   General bed mobility comments: Patient required moderate assistance with L LE and trunk to move to EOB. Required moderate assistance with both LEs to return to supine and +2 to scoot to [facility].  Transfers Overall transfer level: Needs  assistance Equipment used: Rolling walker (2 wheeled) Transfers: Sit to/from Stand Sit to Stand: Min assist         General transfer comment: Patient performs sit to stand and stand to sit with increased time, maintaining L LE knee extended. Decreased eccentric control.   Ambulation/Gait             General Gait Details: Deferred due to inability to maintain WB precautions. Can perform R heel raises with bilateral UE support and PT holding L LE off ground.  Stairs            Wheelchair Mobility    Modified Rankin (Stroke Patients Only)       Balance Overall balance assessment: Needs assistance Sitting-balance support: Feet supported Sitting balance-[name]: Good     Standing balance support: Bilateral upper extremity supported Standing balance-[name]: Poor                               Pertinent Vitals/Pain Pain Assessment: No/denies pain Pain Score: 0-No pain Pain Location: L hip Pain Intervention(s): Limited activity within patient's tolerance;Monitored during session;Ice applied;Patient requesting pain meds-RN notified    Home Living Family/patient expects to be discharged to:: Skilled nursing facility Living Arrangements: Alone   Type of Home: Apartment Home Access: Level entry     Home Layout: One level Home Equipment: Walker - 4 wheels;Cane - single point;Shower seat;Grab bars - toilet;Grab bars - tub/shower      Prior Function Level of Independence: Independent with assistive device(s)  Hand Dominance        Extremity/Trunk Assessment   Upper Extremity Assessment Upper Extremity Assessment: Generalized weakness    Lower Extremity Assessment Lower Extremity Assessment: Generalized weakness;LLE deficits/detail LLE Deficits / Details: Strength grossly 3-/5, limited by pain LLE: Unable to fully assess due to pain       Communication   Communication: No difficulties  Cognition  Arousal/Alertness: Awake/alert Behavior During Therapy: WFL for tasks assessed/performed Overall Cognitive Status: Within Functional Limits for tasks assessed                                        General Comments      Exercises     Assessment/Plan    PT Assessment Patient needs continued PT services  PT Problem List Decreased strength;Decreased range of motion;Decreased activity tolerance;Decreased balance;Decreased mobility;Decreased knowledge of use of DME;Decreased safety awareness;Pain;Decreased skin integrity;Decreased knowledge of precautions       PT Treatment Interventions      PT Goals (Current goals can be found in the Care Plan section)  Acute Rehab PT Goals Patient Stated Goal: "To get better" PT Goal Formulation: With patient Time For Goal Achievement: 02/10/17 Potential to Achieve Goals: Good    Frequency BID   Barriers to discharge Inaccessible home environment;Decreased caregiver support      Co-evaluation               AM-PAC PT "6 Clicks" Daily Activity  Outcome Measure Difficulty turning over in bed (including adjusting bedclothes, sheets and blankets)?: A Lot Difficulty moving from lying on back to sitting on the side of the bed? : A Lot Difficulty sitting down on and standing up from a chair with arms (e.g., wheelchair, bedside commode, etc,.)?: A Little Help needed moving to and from a bed to chair (including a wheelchair)?: Total Help needed walking in hospital room?: Total Help needed climbing 3-5 steps with a railing? : Total 6 Click Score: 10    End of Session Equipment Utilized During Treatment: Gait belt Activity Tolerance: Patient tolerated treatment well;Patient limited by pain Patient left: in bed;with call bell/phone within reach;with bed alarm set Nurse Communication: Mobility status;Weight bearing status;Patient requests pain meds PT Visit Diagnosis: Muscle weakness (generalized) (M62.81);Difficulty in  walking, not elsewhere classified (R26.2);Pain;Unsteadiness on feet (R26.81) Pain - Right/Left: Left Pain - part of body: Hip    Time: 1112-1140 PT Time Calculation (min) (ACUTE ONLY): 28 min   Charges:   PT Evaluation $PT Eval Low Complexity: 1 Procedure     PT G Codes:          Dorice Lamas, PT, DPT 01/27/2017, 11:48 AM

## 2017-01-27 NOTE — NC FL2 (Signed)
[place] LEVEL OF CARE SCREENING TOOL     IDENTIFICATION  Patient Name: Leslie Parker Birthdate: 11-Aug-1932 Sex: female Admission Date (Current Location): 01/25/2017  [place] and [place] Number:  Engineering geologist and Address:  [facility], [address], [place], [place] [address]      Provider Number: [id number]  Attending Physician Name and Address:  Fritzi Mandes, MD  Relative Name and Phone Number:       Current Level of Care: Hospital Recommended Level of Care: [place] Prior Approval Number:    Date Approved/Denied:   PASRR Number: [id number]  Discharge Plan: SNF    Current Diagnoses: Patient Active Problem List   Diagnosis Date Noted  . Closed left hip fracture ([place]) 01/25/2017  . S/P hardware removal 01/17/2017  . Asthma, chronic 02/10/2015  . COPD exacerbation ([place]) 02/10/2015  . Acute on chronic respiratory failure ([place]) 02/10/2015  . Atelectasis 02/10/2015  . Hypoxia   . Shortness of breath   . Femur fracture, left ([place]) 02/03/2015  . Hypertension 02/03/2015    Orientation RESPIRATION BLADDER Height & Weight     Self, Time, Situation, Place  Normal Continent Weight: 164 lb 1.6 oz (74.4 kg) Height:  5\' 9"  (175.3 cm)  BEHAVIORAL SYMPTOMS/MOOD NEUROLOGICAL BOWEL NUTRITION STATUS      Continent    AMBULATORY STATUS COMMUNICATION OF NEEDS Skin   Extensive Assist Verbally Surgical wounds                       Personal Care Assistance Level of Assistance  Bathing, Feeding, Dressing Bathing Assistance: Limited assistance Feeding assistance: Independent Dressing Assistance: Limited assistance     Functional Limitations Info             SPECIAL CARE FACTORS FREQUENCY  PT (By licensed PT)     PT Frequency: Up to 5X per day, 5 days per week              Contractures      Additional Factors Info                  Current Medications (01/27/2017):  This is the current  hospital active medication list Current Facility-Administered Medications  Medication Dose Route Frequency Provider Last Rate Last Dose  . 0.45 % sodium chloride infusion   Intravenous Continuous Earnestine Leys, MD 75 mL/hr at 01/27/17 0620 75 mL/hr at 01/27/17 0620  . acetaminophen (TYLENOL) tablet 650 mg  650 mg Oral Q6H PRN Earnestine Leys, MD   650 mg at 01/27/17 0745   Or  . acetaminophen (TYLENOL) suppository 650 mg  650 mg Rectal Q6H PRN Earnestine Leys, MD      . alum & mag hydroxide-simeth (MAALOX/MYLANTA) 200-200-20 MG/5ML suspension 30 mL  30 mL Oral Q4H PRN Earnestine Leys, MD      . bisacodyl (DULCOLAX) suppository 10 mg  10 mg Rectal Daily PRN Earnestine Leys, MD      . enoxaparin (LOVENOX) injection 30 mg  30 mg Subcutaneous Q24H Earnestine Leys, MD   30 mg at 01/27/17 0745  . ferrous sulfate tablet 325 mg  325 mg Oral Q breakfast Earnestine Leys, MD   325 mg at 01/27/17 0745  . HYDROcodone-acetaminophen (NORCO/VICODIN) 5-325 MG per tablet 1-2 tablet  1-2 tablet Oral Q6H PRN Earnestine Leys, MD   2 tablet at 01/27/17 0507  . insulin aspart (novoLOG) injection 0-5 Units  0-5 Units Subcutaneous QHS Fritzi Mandes, MD  2 Units at 01/26/17 2120  . insulin aspart (novoLOG) injection 0-9 Units  0-9 Units Subcutaneous TID WC Fritzi Mandes, MD   2 Units at 01/27/17 0930  . magnesium hydroxide (MILK OF MAGNESIA) suspension 30 mL  30 mL Oral Daily PRN Fritzi Mandes, MD   30 mL at 01/27/17 0935  . menthol-cetylpyridinium (CEPACOL) lozenge 3 mg  1 lozenge Oral PRN Earnestine Leys, MD       Or  . phenol (CHLORASEPTIC) mouth spray 1 spray  1 spray Mouth/Throat PRN Earnestine Leys, MD      . methocarbamol (ROBAXIN) tablet 500 mg  500 mg Oral Q6H PRN Earnestine Leys, MD   500 mg at 01/27/17 0745   Or  . methocarbamol (ROBAXIN) 500 mg in dextrose 5 % 50 mL IVPB  500 mg Intravenous Q6H PRN Earnestine Leys, MD      . metoCLOPramide (REGLAN) tablet 5-10 mg  5-10 mg Oral Q8H PRN Earnestine Leys, MD       Or  .  metoCLOPramide (REGLAN) injection 5-10 mg  5-10 mg Intravenous Q8H PRN Earnestine Leys, MD      . ondansetron [facility]) tablet 4 mg  4 mg Oral Q6H PRN Earnestine Leys, MD       Or  . ondansetron [facility]) injection 4 mg  4 mg Intravenous Q6H PRN Earnestine Leys, MD      . senna [facility]) tablet 8.6 mg  1 tablet Oral BID Earnestine Leys, MD   8.6 mg at 01/27/17 0929  . sodium phosphate (FLEET) 7-19 GM/118ML enema 1 enema  1 enema Rectal Once PRN Earnestine Leys, MD      . traMADol [facility]) tablet 50 mg  50 mg Oral Q6H PRN Fritzi Mandes, MD      . vancomycin (VANCOCIN) IVPB 1000 mg/200 mL premix  1,000 mg Intravenous Q18H Earnestine Leys, MD   Stopped at 01/26/17 2124  . zolpidem (AMBIEN) tablet 5 mg  5 mg Oral QHS PRN Earnestine Leys, MD   5 mg at 01/27/17 0006     Discharge Medications: Please see discharge summary for a list of discharge medications.  Relevant Imaging Results:  Relevant Lab Results:   Additional Information SS# [id number]  Zettie Pho, LCSW

## 2017-01-27 NOTE — Clinical Social Work Placement (Signed)
   CLINICAL SOCIAL WORK PLACEMENT  NOTE  Date:  01/27/2017  Patient Details  Name: Leslie Parker MRN: [id number] Date of Birth: Oct 16, 1932  Clinical Social Work is seeking post-discharge placement for this patient at the [place] level of care (*CSW will initial, date and re-position this form in  chart as items are completed):  Yes   Patient/family provided with [place] Work Department's list of facilities offering this level of care within the geographic area requested by the patient (or if unable, by the patient's family).  Yes   Patient/family informed of their freedom to choose among providers that offer the needed level of care, that participate in Medicare, Medicaid or managed care program needed by the patient, have an available bed and are willing to accept the patient.  Yes   Patient/family informed of [place]'s ownership interest in [facility] and [facility], as well as of the fact that they are under no obligation to receive care at these facilities.  PASRR submitted to EDS on       PASRR number received on       Existing PASRR number confirmed on 01/27/17     FL2 transmitted to all facilities in geographic area requested by pt/family on 01/27/17     FL2 transmitted to all facilities within larger geographic area on 01/27/17     Patient informed that his/her managed care company has contracts with or will negotiate with certain facilities, including the following:            Patient/family informed of bed offers received.  Patient chooses bed at       Physician recommends and patient chooses bed at      Patient to be transferred to   on  .  Patient to be transferred to facility by       Patient family notified on   of transfer.  Name of family member notified:        PHYSICIAN       Additional Comment:    _______________________________________________ Zettie Pho, LCSW 01/27/2017, 2:36 PM

## 2017-01-27 NOTE — Progress Notes (Signed)
Subjective: 1 Day Post-Op Procedure(s) (LRB): ARTHROPLASTY BIPOLAR HIP (HEMIARTHROPLASTY) removal hardware left hip (Left)    Patient reports pain as mild. Alert and oriented.    Objective: doing well.  csm good left leg.  hgb 9.5 VITALS:   Vitals:   01/26/17 1311 01/27/17 0803  BP:  (!) 147/50  Pulse: 85 60  Resp: 12   Temp: 97.8 F (36.6 C)     Neurologically intact ABD soft Neurovascular intact Sensation intact distally Intact pulses distally Dorsiflexion/Plantar flexion intact Incision: scant drainage  LABS  Recent Labs  01/25/17 1258 01/26/17 0345 01/27/17 0413  HGB 14.0 11.2* 9.5*  HCT 40.8 31.8* 26.7*  WBC 9.7 6.2 6.1  PLT 267 203 173     Recent Labs  01/25/17 1258 01/26/17 0345 01/27/17 0413  NA 134* 136 132*  K 4.0 4.3 4.3  BUN 30* 33* 24*  CREATININE 1.26* 1.24* 0.90  GLUCOSE 177* 139* 157*     Recent Labs  01/25/17 1815  INR 0.95     Assessment/Plan: 1 Day Post-Op Procedure(s) (LRB): ARTHROPLASTY BIPOLAR HIP (HEMIARTHROPLASTY) removal hardware left hip (Left)   Advance diet Up with therapy D/C IV fluids Discharge to SNF

## 2017-01-27 NOTE — Clinical Social Work Note (Signed)
Clinical Social Work Assessment  Patient Details  Name: Leslie Parker MRN: [id number] Date of Birth: [birth year]  Date of referral:  01/27/17               Reason for consult:  Facility Placement                Permission sought to share information with:  Facility Art therapist granted to share information::  Yes, Verbal Permission Granted  Name::        Agency::     Relationship::     Contact Information:     Housing/Transportation Living arrangements for the past 2 months:  Apartment Source of Information:  Patient Patient Interpreter Needed:  None Criminal Activity/Legal Involvement Pertinent to Current Situation/Hospitalization:  No - Comment as needed Significant Relationships:  Adult Children Lives with:  Self Do you feel safe going back to the place where you live?  Yes Need for family participation in patient care:  No (Coment)  Care giving concerns:  PT recommendation for STR   Social Worker assessment / plan:  CSW met with patient at bedside to introduce self and role and discharge planning. The patient gave verbal permission to conduct CSW referral, and she named [facility] as her preference.  At baseline, the patient is independent in all ADLs and IADLs. The patient lives alone in an apartment, and during this admission, she is bright, cheerful, and pleasant. The date of discharge is most likely 01/28/17 depending on stability. CSW will provide bed offers as available.  Employment status:  Retired Forensic scientist:  [employer] PT Recommendations:  [place] / Referral to community resources:  [place]  Patient/Family's Response to care:  The patient thanked the CSW for assistance.  Patient/Family's Understanding of and Emotional Response to Diagnosis, Current Treatment, and Prognosis:  The patient understands the PT recommendation and is in agreement.  Emotional Assessment Appearance:  Appears younger than  stated age Attitude/Demeanor/Rapport:   (Very pleasant) Affect (typically observed):  Pleasant Orientation:  Oriented to Self, Oriented to Place, Oriented to  Time, Oriented to Situation Alcohol / Substance use:  Never Used Psych involvement (Current and /or in the community):  No (Comment)  Discharge Needs  Concerns to be addressed:  Care Coordination, Discharge Planning Concerns Readmission within the last 30 days:  Yes Current discharge risk:  None Barriers to Discharge:  Continued Medical Work up   [employer], LCSW 01/27/2017, 2:33 PM

## 2017-01-27 NOTE — Progress Notes (Signed)
[facility] [place] NAME: Leslie Parker    MR#:  [id number]  DATE OF BIRTH:  November 18, 1932  SUBJECTIVE:  CHIEF COMPLAINT:   Chief Complaint  Patient presents with  . Hip Pain    Recent surgery on left hip, came again with hip fracture, s/p surgery today, no complains, tolerated diet.  REVIEW OF SYSTEMS:   Review of Systems  Constitutional: Negative for chills, fever and weight loss.  HENT: Negative for ear discharge, ear pain and nosebleeds.   Eyes: Negative for blurred vision, pain and discharge.  Respiratory: Negative for sputum production, shortness of breath, wheezing and stridor.   Cardiovascular: Negative for chest pain, palpitations, orthopnea and PND.  Gastrointestinal: Negative for abdominal pain, diarrhea, nausea and vomiting.  Genitourinary: Negative for frequency and urgency.  Musculoskeletal: Positive for joint pain. Negative for back pain.  Neurological: Positive for weakness. Negative for sensory change, speech change and focal weakness.  Psychiatric/Behavioral: Negative for depression and hallucinations. The patient is not nervous/anxious.     DRUG ALLERGIES:  No Known Allergies  VITALS:  Blood pressure (!) 147/50, pulse 60, temperature 97.8 F (36.6 C), resp. rate 12, height 5\' 9"  (1.753 m), weight 74.4 kg (164 lb 1.6 oz), SpO2 99 %.  PHYSICAL EXAMINATION:  GENERAL:  83 y.o.-year-old patient lying in the bed with no acute distress.  EYES: Pupils equal, round, reactive to light and accommodation. No scleral icterus. Extraocular muscles intact.  HEENT: Head atraumatic, normocephalic. Oropharynx and nasopharynx clear.  NECK:  Supple, no jugular venous distention. No thyroid enlargement, no tenderness.  LUNGS: Normal breath sounds bilaterally, no wheezing, rales,rhonchi or crepitation. No use of accessory muscles of respiration.  CARDIOVASCULAR: S1, S2 normal. No murmurs, rubs, or gallops.  ABDOMEN: Soft, nontender,  nondistended. Bowel sounds present. No organomegaly or mass.  EXTREMITIES: No pedal edema, cyanosis, or clubbing. Left hip dressing present NEUROLOGIC: Cranial nerves II through XII are intact. Muscle strength 5/5 in all extremities, except left LL, which is s/p surgery and still have weights as advised by orthopedic. Sensation intact. Gait not checked.  PSYCHIATRIC: The patient is alert and oriented x 3.  SKIN: No obvious rash, lesion, or ulcer.   Physical Exam LABORATORY PANEL:   CBC  Recent Labs Lab 01/27/17 0413  WBC 6.1  HGB 9.5*  HCT 26.7*  PLT 173   ------------------------------------------------------------------------------------------------------------------  Chemistries   Recent Labs Lab 01/27/17 0413  NA 132*  K 4.3  CL 105  CO2 24  GLUCOSE 157*  BUN 24*  CREATININE 0.90  CALCIUM 8.0*   ------------------------------------------------------------------------------------------------------------------  Cardiac Enzymes No results for input(s): TROPONINI in the last 168 hours. ------------------------------------------------------------------------------------------------------------------  RADIOLOGY:  Pelvis Portable  Result Date: 01/26/2017 CLINICAL DATA:  Patient status post left hip arthroplasty. EXAM: PORTABLE PELVIS 1-2 VIEWS COMPARISON:  CT pelvis 01/02/2017. FINDINGS: Interval left hip arthroplasty. Hardware appears intact. Postsurgical changes within the overlying soft tissues. IMPRESSION: Patient status post interval left hip arthroplasty. Electronically Signed   By: Lovey Newcomer M.D.   On: 01/26/2017 13:48   Dg Hip Unilat With Pelvis 1v Left  Result Date: 01/26/2017 CLINICAL DATA:  Intraoperative left hip hemiarthroplasty assessment EXAM: DG HIP (WITH OR WITHOUT PELVIS) 1V*L* COMPARISON:  01/26/2017 at 11:26 a.m. FINDINGS: Part of the left hip hemiarthroplasty is in place, including a collar like ring, and the smoothly marginated stem. Bony lucencies  in the greater trochanter and lateral sub creek trochanteric region related to original hardware. Tissue spreaders in place.  IMPRESSION: 1. Intraoperative assessment during hemiarthroplasty placement, as noted above. Electronically Signed   By: Van Clines M.D.   On: 01/26/2017 12:19   Dg Hip Unilat With Pelvis 1v Left  Result Date: 01/26/2017 CLINICAL DATA:  Left hip hemiarthroplasty after IM nail removal. EXAM: DG HIP (WITH OR WITHOUT PELVIS) 1V*L* COMPARISON:  Earlier the same day FINDINGS: 1126 hours. Sizing component noted for femoral prosthesis in this patient undergoing total hip replacement. Surgical hardware noted on this intraoperative film. IMPRESSION: Intraoperative assessment during left hip replacement. Electronically Signed   By: Misty Stanley M.D.   On: 01/26/2017 12:15   Dg Hip Unilat With Pelvis 1v Left  Result Date: 01/26/2017 CLINICAL DATA:  Intraoperative assessment of left hip hemiarthroplasty and prior IM nail removal. EXAM: DG HIP (WITH OR WITHOUT PELVIS) 1V*L* COMPARISON:  01/26/2017 at 10:31 a.m. FINDINGS: Serrated margins spacer or rasp observed in the proximal femur, in preparation for hemiarthroplasty placement. Femoral head absent. Lucency in the greater trochanter related to original IM nail. I do not observe an acute fracture. The femur is somewhat proximal to its expected orientation with the acetabulum. The ring and a wider proximal stem along the spacer are no longer seen. Bony demineralization. IMPRESSION: 1. Intraoperative view of the left hip during hemiarthroplasty as detailed above. Electronically Signed   By: Van Clines M.D.   On: 01/26/2017 12:08   Dg Hip Unilat With Pelvis 1v Left  Result Date: 01/26/2017 CLINICAL DATA:  Left hip hemiarthroplasty after IM nail removal. EXAM: DG HIP (WITH OR WITHOUT PELVIS) 1V*L* COMPARISON:  01/25/2017 FINDINGS: 1031 hours. IM nail seen on the previous study has been a tree. Sizing femoral component noted over the  proximal femur. Gas in the soft tissues compatible with the intraoperative film. IMPRESSION: Intraoperative evaluation during left hip replacement. Electronically Signed   By: Misty Stanley M.D.   On: 01/26/2017 12:02   Dg Hip Unilat W Or Wo Pelvis 2-3 Views Left  Result Date: 01/25/2017 CLINICAL DATA:  Post scratched it recent screw removal from left hip. EXAM: DG HIP (WITH OR WITHOUT PELVIS) 2-3V LEFT COMPARISON:  02/04/2015. FINDINGS: New left femoral neck fracture. Left hip surgical screw again is been removed. Intramedullary rod and distal left femoral screw is in place. Left knee replacement again noted. No acute bony abnormality identified. 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 IMPRESSION: 1. New left femoral neck fracture. 2. Postsurgical changes left hip with prior removal of left hip screw. intramedullary rod and distal femoral screw in place. Electronically Signed   By: Marcello Moores  Register   On: 01/25/2017 13:38    ASSESSMENT AND PLAN:   Active Problems:   Closed left hip fracture (HCC) Leslie Parker  is a 83 y.o. female with a known history of Hypertension, diabetes who recently had a left hip lag screw removed after having a left intertrochanteric hip fracture returned to the emergency room with left hip fracture. Patient noticed yesterday after walking with a walker of acute onset of pain in the hip. Did not have any fall or trauma.   *  Displaced Subcapital fracture left hip        S/p 1) Revision left hip hemiarthroplasty with [employer] Accolade prosthesis. 2) removal of femoral rod, left femur - Pain medications added as needed. -Incentive spirometer  * Diabetes mellitus.  sliding scale insulin.  * Hypertension. Continue home medications. IV when necessary medications added.  * CKD stage III is  stable  *DVT prophylaxis subcutaneous Lovenox  Social work for discharge planning  All the records are reviewed and case  discussed with Care Management/Social Workerr. Management plans discussed with the patient, family and they are in agreement.  CODE STATUS: full  TOTAL TIME TAKING CARE OF THIS PATIENT: 25 minutes.   Pt's daughter, sin and grand daughter were in room during my visit.  POSSIBLE D/C IN 1-2 DAYS, DEPENDING ON CLINICAL CONDITION.   Leslie Parker M.D on 01/27/2017   Between 7am to 6pm - Pager - [phone]  After 6pm go to [email or web address] - password [email or web address] [facility]  Office  [phone]  CC: Primary care physician; Patient, No Pcp Per  Note: This dictation was prepared with Dragon dictation along with smaller phrase technology. Any transcriptional errors that result from this process are unintentional.

## 2017-01-28 ENCOUNTER — Encounter: Payer: Self-pay | Admitting: Specialist

## 2017-01-28 LAB — BPAM RBC
Blood Product Expiration Date: 201807192359
Blood Product Expiration Date: 201807192359
UNIT TYPE AND RH: 6200
Unit Type and Rh: 6200

## 2017-01-28 LAB — TYPE AND SCREEN
ABO/RH(D): A POS
ANTIBODY SCREEN: NEGATIVE
UNIT DIVISION: 0
Unit division: 0

## 2017-01-28 LAB — BASIC METABOLIC PANEL
ANION GAP: 5 (ref 5–15)
BUN: 21 mg/dL — AB (ref 6–20)
CALCIUM: 8.3 mg/dL — AB (ref 8.9–10.3)
CO2: 24 mmol/L (ref 22–32)
Chloride: 105 mmol/L (ref 101–111)
Creatinine, Ser: 0.77 mg/dL (ref 0.44–1.00)
GFR calc Af Amer: >60 mL/min (ref >60)
Glucose, Bld: 133 mg/dL — ABNORMAL HIGH (ref 65–99)
POTASSIUM: 4.1 mmol/L (ref 3.5–5.1)
SODIUM: 134 mmol/L — AB (ref 135–145)

## 2017-01-28 LAB — CBC
HCT: 26.2 % — ABNORMAL LOW (ref 35.0–47.0)
Hemoglobin: 9.5 g/dL — ABNORMAL LOW (ref 12.0–16.0)
MCH: 31.7 pg (ref 26.0–34.0)
MCHC: 36 g/dL (ref 32.0–36.0)
MCV: 88.2 fL (ref 80.0–100.0)
PLATELETS: 176 10*3/uL (ref 150–440)
RBC: 2.98 MIL/uL — AB (ref 3.80–5.20)
RDW: 13.4 % (ref 11.5–14.5)
WBC: 8.6 10*3/uL (ref 3.6–11.0)

## 2017-01-28 LAB — PREPARE RBC (CROSSMATCH)

## 2017-01-28 LAB — GLUCOSE, CAPILLARY
GLUCOSE-CAPILLARY: 171 mg/dL — AB (ref 65–99)
Glucose-Capillary: 142 mg/dL — ABNORMAL HIGH (ref 65–99)

## 2017-01-28 MED ORDER — ENOXAPARIN SODIUM 30 MG/0.3ML ~~LOC~~ SOLN: 30.0000 mg | SUBCUTANEOUS | 0 refills | Status: DC

## 2017-01-28 MED ORDER — OXYCODONE HCL 5 MG PO TABS: 5.0000 mg | ORAL_TABLET | Freq: Four times a day (QID) | ORAL | 0 refills | Status: DC | PRN

## 2017-01-28 MED ORDER — FERROUS SULFATE 325 (65 FE) MG PO TABS: 325.0000 mg | ORAL_TABLET | Freq: Every day | ORAL | 3 refills | Status: DC

## 2017-01-28 NOTE — Clinical Social Work Placement (Addendum)
   CLINICAL SOCIAL WORK PLACEMENT  NOTE  Date:  01/28/2017  Patient Details  Name: Leslie Parker MRN: [id number] Date of Birth: [birth year]  Clinical Social Work is seeking post-discharge placement for this patient at the [place] level of care (*CSW will initial, date and re-position this form in  chart as items are completed):  Yes   Patient/family provided with [facility] Work Department's list of facilities offering this level of care within the geographic area requested by the patient (or if unable, by the patient's family).  Yes   Patient/family informed of their freedom to choose among providers that offer the needed level of care, that participate in Medicare, Medicaid or managed care program needed by the patient, have an available bed and are willing to accept the patient.  Yes   Patient/family informed of [facility]'s ownership interest in [facility] and [facility], as well as of the fact that they are under no obligation to receive care at these facilities.  PASRR submitted to EDS on       PASRR number received on       Existing PASRR number confirmed on 01/27/17     FL2 transmitted to all facilities in geographic area requested by pt/family on 01/27/17     FL2 transmitted to all facilities within larger geographic area on 01/27/17     Patient informed that his/her managed care company has contracts with or will negotiate with certain facilities, including the following:        Yes   Patient/family informed of bed offers received.  Patient chooses bed at  ([facility])     Physician recommends and patient chooses bed at      Patient to be transferred to  ([facility] ) on 01/28/17.  Patient to be transferred to facility by  [facility] )     Patient family notified on 01/28/17 of transfer.  Name of family member notified:   (CSW left patient's daughter Lattie Haw a Advertising account executive) Lattie Haw called CSW back and is agreeable to D/C plan.    PHYSICIAN       Additional Comment:    _______________________________________________ Korryn Pancoast, Veronia Beets, LCSW 01/28/2017, 12:00 PM

## 2017-01-28 NOTE — Progress Notes (Signed)
Physical Therapy Treatment Patient Details Name: Leslie Parker MRN: [id number] DOB: 11/12/1932 Today's Date: 01/28/2017    History of Present Illness Patient is an 83 y.o. female admitted on 06 JUL after having increased L hip pain. Patient had L hip lag screw removed after L intertrochanteric hip fx was repaired in 2016. Revision of L hip hemiarthroplasty performed on 07 JUL by Dr. Sabra Heck. PMH includes HTN and DMII. Patient also reports having L rotator cuff surgery for which she was undergoing OP PT.    PT Comments    Pt agreeable to PT for bed exercises only due to increased pain and fatigue. Pt reports 7/10 pain in left hip and knee. Pt participates in supine exercises with assist as needed on the left primarily; requires assist on the R for straight leg raise as well. Pt educated in proper exertion with isometric exercises to avoid increase in pain during exercise; pt understands post instruction and performs appropriately. Pt has current discharge to skilled nursing facility to continue rehab efforts.    Follow Up Recommendations  SNF     Equipment Recommendations  None recommended by PT    Recommendations for Other Services       Precautions / Restrictions Precautions Precautions: Fall;Posterior Hip Required Braces or Orthoses: Other Brace/Splint Other Brace/Splint: bucks traction Restrictions Weight Bearing Restrictions: Yes LLE Weight Bearing: Partial weight bearing LLE Partial Weight Bearing Percentage or Pounds: 25    Mobility  Bed Mobility Overal bed mobility: Needs Assistance Bed Mobility: Supine to Sit;Sit to Supine     Supine to sit: Mod assist Sit to supine: Mod assist   General bed mobility comments: Not tested; wished to remain in bed due to pain/fatigue  Transfers                 General transfer comment: deferred due to pt safety and pain. Pt declined to attempt even after rest break due to pain.  Ambulation/Gait                  Stairs            Wheelchair Mobility    Modified Rankin (Stroke Patients Only)       Balance Overall balance assessment: Needs assistance Sitting-balance support: Feet supported;Bilateral upper extremity supported Sitting balance-[name]: Good                                      Cognition Arousal/Alertness: Awake/alert Behavior During Therapy: WFL for tasks assessed/performed Overall Cognitive Status: Within Functional Limits for tasks assessed                                        Exercises Total Joint Exercises Ankle Circles/Pumps: AROM;Both;20 reps;Supine Quad Sets: Strengthening;Both;20 reps;Supine Gluteal Sets: Strengthening;Both;20 reps;Supine Short Arc Quad: AROM;Strengthening;Both;20 reps;Supine Heel Slides: AAROM;10 reps;Supine;Left (2 sets, AROM R) Hip ABduction/ADduction: AAROM;Left;10 reps;Supine (2 sets, AROM R) Straight Leg Raises: AAROM;10 reps;Supine;Both (2 sets) General Exercises - Lower Extremity Heel Slides: AAROM;Strengthening;Left;5 reps;Supine Other Exercises Other Exercises: pt educated in use of pursed lip breathing and pleasant imagery to support cognitive behavioral pain management, pt demo'd understanding Other Exercises: Pt provided with review of posterior THPs and weight bearing status. Initial attempt pt was able to recall 1/3 posterior THPs and correct weight bearing status; by end of session  pt able to recall 3/3 with 1 verbal cue.    General Comments        Pertinent Vitals/Pain Pain Assessment: 0-10 Pain Score: 7  Pain Location: L hip/knee Pain Descriptors / Indicators: Constant Pain Intervention(s): Limited activity within patient's tolerance;Monitored during session;Premedicated before session    Home Living Family/patient expects to be discharged to:: [place]: Alone   Type of Home: Apartment (pt reports it's a senior citizen apt complex  ) Home Access: Level entry   Home Layout: One level Home Equipment: Environmental consultant - 4 wheels;Cane - single point;Shower seat;Grab bars - toilet;Grab bars - tub/shower;Walker - 2 wheels;Hand held shower head;Adaptive equipment Additional Comments: handicap accessible apt    Prior Function Level of Independence: Independent with assistive device(s)      Comments: using 2WW for household distances, rollator for carrying groceries inside and taking tash out; indep with ADL, driving, groceries, med mgt, no falls in past 12 months; enjoys reading, sewing, and puzzles   PT Goals (current goals can now be found in the care plan section) Acute Rehab PT Goals Patient Stated Goal: have less pain Progress towards PT goals: Progressing toward goals (slowly)    Frequency    BID      PT Plan Current plan remains appropriate    Co-evaluation              AM-PAC PT "6 Clicks" Daily Activity  Outcome Measure                   End of Session   Activity Tolerance: Patient tolerated treatment well;Patient limited by pain Patient left: in bed;with call bell/phone within reach;with bed alarm set   PT Visit Diagnosis: Muscle weakness (generalized) (M62.81);Difficulty in walking, not elsewhere classified (R26.2);Pain;Unsteadiness on feet (R26.81) Pain - Right/Left: Left Pain - part of body: Hip;Knee     Time: 2130-8657 PT Time Calculation (min) (ACUTE ONLY): 25 min  Charges:  $Therapeutic Exercise: 23-37 mins                    G Codes:        Larae Grooms, PTA 01/28/2017, 11:43 AM

## 2017-01-28 NOTE — Progress Notes (Addendum)
Clinical Social Worker (CSW) presented bed offers to patient and she chose [facility]. Patient is medically stable for D/C to [facility] today. Per [facility] liaison patient can come today to room 502. RN will call report to [phone] and arrange EMS for transport. CSW sent D/C orders to [facility] via [facility]. Patient is aware of above. CSW attempted to contact patient's daughter Lattie Haw however she did not answer and a voicemail was left. Please reconsult if future social work needs arise. CSW signing off.   CSW received a call back from patient's daughter Lattie Haw. Lattie Haw is agreeable to D/C plan.   [employer], LCSW [phone]

## 2017-01-28 NOTE — Progress Notes (Signed)
Report called to [facility]. Report given to [facility]. Aquacel dressings changed. IV removed. Prescriptions for oxycodone and ultram along with printed AVS placed in transfer package.  Vital signs stable at this time. EMS here to pick patient up.  Wynema Birch, RN

## 2017-01-28 NOTE — Care Management Important Message (Signed)
Important Message  Patient Details  Name: Leslie Parker MRN: [id number] Date of Birth: Apr 22, 1933   Medicare Important Message Given:  Yes    Jolly Mango, RN 01/28/2017, 11:07 AM

## 2017-01-28 NOTE — Progress Notes (Signed)
Subjective: 2 Days Post-Op Procedure(s) (LRB): ARTHROPLASTY BIPOLAR HIP (HEMIARTHROPLASTY) removal hardware left hip (Left)    Patient reports pain as mild.  Objective: Pain better.  csm good and dressing dry.   Hip stable and hgb stable  VITALS:   Vitals:   01/28/17 0912 01/28/17 0956  BP: (!) 157/66   Pulse: 99 95  Resp: 18   Temp: 98.4 F (36.9 C)     Neurologically intact ABD soft Neurovascular intact Sensation intact distally Intact pulses distally Dorsiflexion/Plantar flexion intact Incision: no drainage  LABS  Recent Labs  01/26/17 0345 01/27/17 0413 01/28/17 0455  HGB 11.2* 9.5* 9.5*  HCT 31.8* 26.7* 26.2*  WBC 6.2 6.1 8.6  PLT 203 173 176     Recent Labs  01/26/17 0345 01/27/17 0413 01/28/17 0455  NA 136 132* 134*  K 4.3 4.3 4.1  BUN 33* 24* 21*  CREATININE 1.24* 0.90 0.77  GLUCOSE 139* 157* 133*     Recent Labs  01/25/17 1815  INR 0.95     Assessment/Plan: 2 Days Post-Op Procedure(s) (LRB): ARTHROPLASTY BIPOLAR HIP (HEMIARTHROPLASTY) removal hardware left hip (Left)   Advance diet Up with therapy Discharge to SNF   RTC 10 days D/C on EC ASA 325mg  bid

## 2017-01-28 NOTE — Evaluation (Signed)
Occupational Therapy Evaluation Patient Details Name: Leslie Parker MRN: [id number] DOB: Nov 15, 1932 Today's Date: 01/28/2017    History of Present Illness Patient is an 83 y.o. female admitted on 06 JUL after having increased L hip pain. Patient had L hip lag screw removed after L intertrochanteric hip fx was repaired in 2016. Revision of L hip hemiarthroplasty performed on 07 JUL by Dr. Sabra Heck. PMH includes HTN and DMII. Patient also reports having L rotator cuff surgery for which she was undergoing OP PT.   Clinical Impression   Pt seen for OT evaluation this date. Pt is 83 year old female s/p L hip hemiarthroplasty with posterior total hip precautions who lives alone in a handicap accessible apartment. Pt was independent in all ADL and IADL prior to surgery and is eager to return to PLOF.  Pt is currently limited in functional ADL due to pain, decreased ROM/strength, and decreased knowledge of precautions and AE/DME for self care tasks.  Pt requires mod-max assist for LB dressing and bathing skills due to pain and decreased AROM of L LE and would benefit from continued skilled OT services for education in assistive devices, functional mobility, and education in recommendations for home modifications to increase safety and prevent falls.  Pt is a good candidate for SNF to continue rehabilitation.       Follow Up Recommendations  SNF    Equipment Recommendations  None recommended by OT    Recommendations for Other Services       Precautions / Restrictions Precautions Precautions: Fall;Posterior Hip Required Braces or Orthoses: Other Brace/Splint Other Brace/Splint: bucks traction Restrictions Weight Bearing Restrictions: Yes LLE Weight Bearing: Partial weight bearing LLE Partial Weight Bearing Percentage or Pounds: 25      Mobility Bed Mobility Overal bed mobility: Needs Assistance Bed Mobility: Supine to Sit;Sit to Supine     Supine to sit: Mod assist Sit to supine: Mod  assist   General bed mobility comments: Patient required moderate assistance with L LE and trunk for supine>sitting EOB with additional time/effort to complete, using of BUE with bed rails, and increase in pain 4 to 7/10. Required moderate assistance with both LEs to return to supine and HOB flat with foot of bed elevated to allow for gravity to assist with scooting up to [facility] which pt was able to perform with supervision and additional time/effort and use of bed rails  Transfers                 General transfer comment: deferred due to pt safety and pain. Pt declined to attempt even after rest break due to pain.    Balance Overall balance assessment: Needs assistance Sitting-balance support: Feet supported;Bilateral upper extremity supported Sitting balance-[name]: Good                                     ADL either performed or assessed with clinical judgement   ADL Overall ADL's : Needs assistance/impaired Eating/Feeding: Set up;Bed level   Grooming: Set up;Sitting   Upper Body Bathing: Sitting;Set up;Supervision/ safety   Lower Body Bathing: Sitting/lateral leans;Moderate assistance   Upper Body Dressing : Sitting;Set up;Supervision/safety   Lower Body Dressing: Sitting/lateral leans;Moderate assistance;Maximal assistance;Bed level Lower Body Dressing Details (indicate cue type and reason): pt educated in AE for LB ADL tasks with verbal instruction and visual demonstration, pt verbalized understanding. Was in too much pain to attempt herself.  Toilet Transfer Details (indicate cue type and reason): deferred due to pain           General ADL Comments: pt generally mod-max assist for LB ADL tasks, verbal cues for posterior THPs      Vision Baseline Vision/History: Wears glasses Wears Glasses: Reading only Patient Visual Report: No change from baseline Vision Assessment?: No apparent visual deficits     Perception     Praxis       Pertinent Vitals/Pain Pain Assessment: 0-10 Pain Score: 7  Pain Location: 4/10 at rest at start of session, increasing to 7/10 with bed mobility in L hip and L knee incision site Pain Descriptors / Indicators: Burning Pain Intervention(s): Limited activity within patient's tolerance;Monitored during session;Premedicated before session;Repositioned;Utilized relaxation techniques;Patient requesting pain meds-RN notified     Hand Dominance Right   Extremity/Trunk Assessment Upper Extremity Assessment Upper Extremity Assessment: RUE deficits/detail;LUE deficits/detail RUE Deficits / Details: ROM and strength WFL for age LUE Deficits / Details: L shoulder flexion 3+/5 due to pain and s/p previous rotator cuff repair for which she was getting OP PT for prior to this hospitalization; LUE ROM and elbow/grip/pinch strength WFL for age   Lower Extremity Assessment Lower Extremity Assessment: LLE deficits/detail;Defer to PT evaluation LLE Deficits / Details: Strength grossly 3-/5, limited by pain   Cervical / Trunk Assessment Cervical / Trunk Assessment: Normal   Communication Communication Communication: No difficulties   Cognition Arousal/Alertness: Awake/alert Behavior During Therapy: WFL for tasks assessed/performed Overall Cognitive Status: Within Functional Limits for tasks assessed                                     General Comments       Exercises General Exercises - Lower Extremity Heel Slides: AAROM;Strengthening;Left;5 reps;Supine Other Exercises Other Exercises: pt educated in use of pursed lip breathing and pleasant imagery to support cognitive behavioral pain management, pt demo'd understanding Other Exercises: Pt provided with review of posterior THPs and weight bearing status. Initial attempt pt was able to recall 1/3 posterior THPs and correct weight bearing status; by end of session pt able to recall 3/3 with 1 verbal cue.   Shoulder Instructions       Home Living Family/patient expects to be discharged to:: [place]: Alone   Type of Home: Apartment (pt reports it's a senior citizen apt complex ) Home Access: Level entry     Home Layout: One level     Bathroom Shower/Tub: Tub/shower unit;Curtain   Bathroom Toilet: Handicapped height     Home Equipment: Environmental consultant - 4 wheels;Cane - single point;Shower seat;Grab bars - toilet;Grab bars - tub/shower;Walker - 2 wheels;Hand held shower head;Adaptive equipment Adaptive Equipment: Reacher;Sock aid Additional Comments: handicap accessible apt      Prior Functioning/Environment Level of Independence: Independent with assistive device(s)        Comments: using 2WW for household distances, rollator for carrying groceries inside and taking tash out; indep with ADL, driving, groceries, med mgt, no falls in past 12 months; enjoys reading, sewing, and puzzles        OT Problem List: Decreased strength;Pain;Decreased range of motion;Decreased activity tolerance;Impaired balance (sitting and/or standing);Decreased knowledge of use of DME or AE;Decreased knowledge of precautions      OT Treatment/Interventions: Self-care/ADL training;Therapeutic exercise;Therapeutic activities;Energy conservation;DME and/or AE instruction;Patient/family education    OT Goals(Current goals can be found in the care plan section) Acute  Rehab OT Goals Patient Stated Goal: have less pain OT Goal Formulation: With patient Time For Goal Achievement: 02/11/17 Potential to Achieve Goals: Good  OT Frequency: Min 1X/week   Barriers to D/C:            Co-evaluation              AM-PAC PT "6 Clicks" Daily Activity     Outcome Measure Help from another person eating meals?: None Help from another person taking care of personal grooming?: None Help from another person toileting, which includes using toliet, bedpan, or urinal?: A Lot Help from another person bathing  (including washing, rinsing, drying)?: A Lot Help from another person to put on and taking off regular upper body clothing?: A Little Help from another person to put on and taking off regular lower body clothing?: A Lot 6 Click Score: 17   End of Session Nurse Communication: Patient requests pain meds (pt called RN at end of session to request pain meds)  Activity Tolerance: Patient limited by pain Patient left: in bed;with call bell/phone within reach;with bed alarm set;Other (comment) (buck's traction reapplied to LLE)  OT Visit Diagnosis: Other abnormalities of gait and mobility (R26.89);Muscle weakness (generalized) (M62.81);Pain Pain - Right/Left: Left Pain - part of body: Leg                Time: 3343-5686 OT Time Calculation (min): 51 min Charges:  OT General Charges $OT Visit: 1 Procedure OT Evaluation $OT Eval Low Complexity: 1 Procedure OT Treatments $Self Care/Home Management : 8-22 mins $Therapeutic Activity: 8-22 mins G-Codes:     Jeni Salles, MPH, MS, OTR/L ascom [phone] 01/28/17, 10:02 AM

## 2017-01-28 NOTE — Discharge Summary (Signed)
[place] [facility] [place] NAME: Leslie Parker    MR#:  [id number]  DATE OF BIRTH:  1932/12/26  DATE OF ADMISSION:  01/25/2017 ADMITTING PHYSICIAN: Hillary Bow, MD  DATE OF DISCHARGE: 01/28/17  PRIMARY CARE PHYSICIAN: Patient, No Pcp Per    ADMISSION DIAGNOSIS:  Left hip pain [M25.552] Closed displaced fracture of left femoral neck ([place]) [S72.002A]  DISCHARGE DIAGNOSIS:  Closed displaced fracture of the left femoral neck status post surgery  SECONDARY DIAGNOSIS:   Past Medical History:  Diagnosis Date  . Asthma   . Cancer (HCC)    Basal Cell  . Diabetes mellitus without complication ([place])   . Heart murmur   . Hypertension   . Impetigo   . Osteoarthritis   . Osteopenia   . Wears dentures    full upper and lower    HOSPITAL COURSE:   NaomiCatesis a 83 y.o.femalewith a known history of Hypertension, diabetes who recently had a left hip lag screw removed after having a left intertrochanteric hip fracture returned to the emergency room with left hip fracture. Patient noticed yesterday after walking with a walker of acute onset of pain in the hip. Did not have any fall or trauma.   * Displaced Subcapital fracture lefthipPOD #3 S/p 1) Revision lefthip hemiarthroplasty with Stryker Accolade prosthesis. 2) removal of femoral rod, left femur - Pain medications added as needed. -Incentive spirometer  * Diabetes mellitus.  sliding scale insulin. -Recommend for him  * Hypertension. Continue home medications. IV when necessary medications added.  * CKD stage III is stable  *DVT prophylaxis subcutaneous Lovenox  Social work for discharge planning--- [facility]  CONSULTS OBTAINED:  Treatment Team:  Earnestine Leys, MD  DRUG ALLERGIES:  No Known Allergies  DISCHARGE MEDICATIONS:   Current Discharge Medication List    START taking these medications   Details  enoxaparin (LOVENOX) 30 MG/0.3ML  injection Inject 0.3 mLs (30 mg total) into the skin daily. Qty: 14 Syringe, Refills: 0    ferrous sulfate 325 (65 FE) MG tablet Take 1 tablet (325 mg total) by mouth daily with breakfast. Qty: 30 tablet, Refills: 3      CONTINUE these medications which have CHANGED   Details  oxyCODONE (OXY IR/ROXICODONE) 5 MG immediate release tablet Take 1 tablet (5 mg total) by mouth every 6 (six) hours as needed for breakthrough pain ((for MODERATE breakthrough pain)). Qty: 30 tablet, Refills: 0      CONTINUE these medications which have NOT CHANGED   Details  ADVAIR DISKUS 500-50 MCG/DOSE AEPB Inhale 1 puff into the lungs 2 (two) times daily. Refills: 5    amLODipine (NORVASC) 10 MG tablet Take 10 mg by mouth daily.    aspirin EC 325 MG tablet Take 1 tablet (325 mg total) by mouth daily. Qty: 30 tablet, Refills: 0    Cyanocobalamin (B-12) 2500 MCG TABS Take 2,500 mcg by mouth daily.    lisinopril (PRINIVIL,ZESTRIL) 10 MG tablet Take 10 mg by mouth daily.     Magnesium Oxide 500 MG CAPS Take 500 mg by mouth daily.    metFORMIN (GLUCOPHAGE) 500 MG tablet Take 1 tablet by mouth 2 (two) times daily.    Multiple Vitamin (MULTI-VITAMINS) TABS Take 1 tablet by mouth daily.    traZODone (DESYREL) 100 MG tablet Take 100 mg by mouth at bedtime.    ondansetron (ZOFRAN) 4 MG tablet Take 1 tablet (4 mg total) by mouth every 8 (eight)  hours as needed for nausea or vomiting. Qty: 30 tablet, Refills: 0    traMADol (ULTRAM) 50 MG tablet Take 50 mg by mouth daily.        If you experience worsening of your admission symptoms, develop shortness of breath, life threatening emergency, suicidal or homicidal thoughts you must seek medical attention immediately by calling 911 or calling your MD immediately  if symptoms less severe.  You Must read complete instructions/literature along with all the possible adverse reactions/side effects for all the Medicines you take and that have been prescribed to you.  Take any new Medicines after you have completely understood and accept all the possible adverse reactions/side effects.   Please note  You were cared for by a hospitalist during your hospital stay. If you have any questions about your discharge medications or the care you received while you were in the hospital after you are discharged, you can call the unit and asked to speak with the hospitalist on call if the hospitalist that took care of you is not available. Once you are discharged, your primary care physician will handle any further medical issues. Please note that NO REFILLS for any discharge medications will be authorized once you are discharged, as it is imperative that you return to your primary care physician (or establish a relationship with a primary care physician if you do not have one) for your aftercare needs so that they can reassess your need for medications and monitor your lab values. Today   SUBJECTIVE   No new complaints  VITAL SIGNS:  Blood pressure (!) 157/66, pulse 99, temperature 98.4 F (36.9 C), temperature source Oral, resp. rate 18, height 5\' 9"  (1.753 m), weight 74.4 kg (164 lb 1.6 oz), SpO2 95 %.  I/O:   Intake/Output Summary (Last 24 hours) at 01/28/17 1043 Last data filed at 01/28/17 0859  Gross per 24 hour  Intake             1740 ml  Output             1050 ml  Net              690 ml    PHYSICAL EXAMINATION:  GENERAL:  83 y.o.-year-old patient lying in the bed with no acute distress.  EYES: Pupils equal, round, reactive to light and accommodation. No scleral icterus. Extraocular muscles intact.  HEENT: Head atraumatic, normocephalic. Oropharynx and nasopharynx clear.  NECK:  Supple, no jugular venous distention. No thyroid enlargement, no tenderness.  LUNGS: Normal breath sounds bilaterally, no wheezing, rales,rhonchi or crepitation. No use of accessory muscles of respiration.  CARDIOVASCULAR: S1, S2 normal. No murmurs, rubs, or gallops.  ABDOMEN:  Soft, non-tender, non-distended. Bowel sounds present. No organomegaly or mass.  EXTREMITIES: No pedal edema, cyanosis, or clubbing.  NEUROLOGIC: Cranial nerves II through XII are intact. Muscle strength 5/5 in all extremities. Sensation intact. Gait not checked.  PSYCHIATRIC: The patient is alert and oriented x 3.  SKIN: No obvious rash, lesion, or ulcer.   DATA REVIEW:   CBC   Recent Labs Lab 01/28/17 0455  WBC 8.6  HGB 9.5*  HCT 26.2*  PLT 176    Chemistries   Recent Labs Lab 01/28/17 0455  NA 134*  K 4.1  CL 105  CO2 24  GLUCOSE 133*  BUN 21*  CREATININE 0.77  CALCIUM 8.3*    Microbiology Results   Recent Results (from the past 240 hour(s))  MRSA PCR Screening  Status: None   Collection Time: 01/25/17  3:43 PM  Result Value Ref Range Status   MRSA by PCR NEGATIVE NEGATIVE Final    Comment:        The GeneXpert MRSA Assay (FDA approved for NASAL specimens only), is one component of a comprehensive MRSA colonization surveillance program. It is not intended to diagnose MRSA infection nor to guide or monitor treatment for MRSA infections.     RADIOLOGY:  Pelvis Portable  Result Date: 01/26/2017 CLINICAL DATA:  Patient status post left hip arthroplasty. EXAM: PORTABLE PELVIS 1-2 VIEWS COMPARISON:  CT pelvis 01/02/2017. FINDINGS: Interval left hip arthroplasty. Hardware appears intact. Postsurgical changes within the overlying soft tissues. IMPRESSION: Patient status post interval left hip arthroplasty. Electronically Signed   By: Lovey Newcomer M.D.   On: 01/26/2017 13:48   Dg Hip Unilat With Pelvis 1v Left  Result Date: 01/26/2017 CLINICAL DATA:  Intraoperative left hip hemiarthroplasty assessment EXAM: DG HIP (WITH OR WITHOUT PELVIS) 1V*L* COMPARISON:  01/26/2017 at 11:26 a.m. FINDINGS: Part of the left hip hemiarthroplasty is in place, including a collar like ring, and the smoothly marginated stem. Bony lucencies in the greater trochanter and lateral sub  creek trochanteric region related to original hardware. Tissue spreaders in place. IMPRESSION: 1. Intraoperative assessment during hemiarthroplasty placement, as noted above. Electronically Signed   By: Van Clines M.D.   On: 01/26/2017 12:19   Dg Hip Unilat With Pelvis 1v Left  Result Date: 01/26/2017 CLINICAL DATA:  Left hip hemiarthroplasty after IM nail removal. EXAM: DG HIP (WITH OR WITHOUT PELVIS) 1V*L* COMPARISON:  Earlier the same day FINDINGS: 1126 hours. Sizing component noted for femoral prosthesis in this patient undergoing total hip replacement. Surgical hardware noted on this intraoperative film. IMPRESSION: Intraoperative assessment during left hip replacement. Electronically Signed   By: Misty Stanley M.D.   On: 01/26/2017 12:15   Dg Hip Unilat With Pelvis 1v Left  Result Date: 01/26/2017 CLINICAL DATA:  Intraoperative assessment of left hip hemiarthroplasty and prior IM nail removal. EXAM: DG HIP (WITH OR WITHOUT PELVIS) 1V*L* COMPARISON:  01/26/2017 at 10:31 a.m. FINDINGS: Serrated margins spacer or rasp observed in the proximal femur, in preparation for hemiarthroplasty placement. Femoral head absent. Lucency in the greater trochanter related to original IM nail. I do not observe an acute fracture. The femur is somewhat proximal to its expected orientation with the acetabulum. The ring and a wider proximal stem along the spacer are no longer seen. Bony demineralization. IMPRESSION: 1. Intraoperative view of the left hip during hemiarthroplasty as detailed above. Electronically Signed   By: Van Clines M.D.   On: 01/26/2017 12:08   Dg Hip Unilat With Pelvis 1v Left  Result Date: 01/26/2017 CLINICAL DATA:  Left hip hemiarthroplasty after IM nail removal. EXAM: DG HIP (WITH OR WITHOUT PELVIS) 1V*L* COMPARISON:  01/25/2017 FINDINGS: 1031 hours. IM nail seen on the previous study has been a tree. Sizing femoral component noted over the proximal femur. Gas in the soft tissues  compatible with the intraoperative film. IMPRESSION: Intraoperative evaluation during left hip replacement. Electronically Signed   By: Misty Stanley M.D.   On: 01/26/2017 12:02     Management plans discussed with the patient, family and they are in agreement.  CODE STATUS:     Code Status Orders        Start     Ordered   01/25/17 1402  Full code  Continuous     01/25/17 1402    Code Status  History    Date Active Date Inactive Code Status Order ID Comments User Context   01/17/2017  2:44 PM 01/18/2017  5:54 PM Full Code [id number]  Thornton Park, MD Inpatient   02/04/2015  3:34 PM 02/10/2015  8:36 PM Full Code [id number]  Thornton Park, MD Inpatient   02/03/2015  5:51 PM 02/04/2015  3:34 PM Full Code [id number]  Thornton Park, MD Inpatient   02/03/2015  5:49 PM 02/03/2015  5:50 PM Full Code [id number]  Thornton Park, MD Inpatient   02/03/2015  1:38 PM 02/03/2015  5:49 PM Full Code [id number]  Hillary Bow, MD ED      TOTAL TIME TAKING CARE OF THIS PATIENT: *40* minutes.    Shontelle Muska M.D on 01/28/2017 at 10:43 AM  Between 7am to 6pm - Pager - [phone] After 6pm go to [email or web address] - password [email or web address] [place]  Office  [phone]  CC: Primary care physician; Patient, No Pcp Per

## 2017-01-29 LAB — SURGICAL PATHOLOGY

## 2017-10-08 ENCOUNTER — Other Ambulatory Visit: Payer: Self-pay | Admitting: Orthopedic Surgery

## 2017-10-08 DIAGNOSIS — Z96642 Presence of left artificial hip joint: Secondary | ICD-10-CM

## 2017-10-24 ENCOUNTER — Other Ambulatory Visit
Admission: RE | Admit: 2017-10-24 | Discharge: 2017-10-24 | Disposition: A | Discharge status: Home / Self Care | Payer: Medicare Other | Source: Ambulatory Visit | Attending: Orthopedic Surgery | Admitting: Orthopedic Surgery

## 2017-10-24 ENCOUNTER — Encounter
Admission: RE | Admit: 2017-10-24 | Discharge: 2017-10-24 | Disposition: A | Discharge status: Home / Self Care | Payer: Medicare Other | Source: Ambulatory Visit | Attending: Orthopedic Surgery | Admitting: Orthopedic Surgery

## 2017-10-24 ENCOUNTER — Ambulatory Visit
Admission: RE | Admit: 2017-10-24 | Discharge: 2017-10-24 | Disposition: A | Discharge status: Home / Self Care | Payer: Medicare Other | Source: Ambulatory Visit | Attending: Orthopedic Surgery | Admitting: Orthopedic Surgery

## 2017-10-24 DIAGNOSIS — Z96642 Presence of left artificial hip joint: Secondary | ICD-10-CM | POA: Insufficient documentation

## 2017-10-24 LAB — CBC
HCT: 40.3 % (ref 35.0–47.0)
HEMOGLOBIN: 13.5 g/dL (ref 12.0–16.0)
MCH: 29.3 pg (ref 26.0–34.0)
MCHC: 33.6 g/dL (ref 32.0–36.0)
MCV: 87.2 fL (ref 80.0–100.0)
Platelets: 289 10*3/uL (ref 150–440)
RBC: 4.62 MIL/uL (ref 3.80–5.20)
RDW: 13.6 % (ref 11.5–14.5)
WBC: 7 10*3/uL (ref 3.6–11.0)

## 2017-10-24 LAB — C-REACTIVE PROTEIN: CRP: <0.8 mg/dL (ref <1.0)

## 2017-10-24 LAB — SEDIMENTATION RATE: Sed Rate: 45 mm/hr — ABNORMAL HIGH (ref 0–30)

## 2017-10-24 MED ORDER — TECHNETIUM TC 99M MEDRONATE IV KIT
25.0000 | PACK | Freq: Once | INTRAVENOUS | Status: AC | PRN
  Administered 2017-10-24: 23.36 via INTRAVENOUS

## 2017-11-13 ENCOUNTER — Ambulatory Visit: Payer: Self-pay | Admitting: Orthopedic Surgery

## 2017-11-25 ENCOUNTER — Encounter
Admission: RE | Admit: 2017-11-25 | Discharge: 2017-11-25 | Disposition: A | Discharge status: Home / Self Care | Payer: Medicare Other | Source: Ambulatory Visit | Attending: Orthopedic Surgery | Admitting: Orthopedic Surgery

## 2017-11-25 ENCOUNTER — Other Ambulatory Visit: Payer: Self-pay

## 2017-11-25 DIAGNOSIS — Z01810 Encounter for preprocedural cardiovascular examination: Secondary | ICD-10-CM | POA: Insufficient documentation

## 2017-11-25 DIAGNOSIS — R9431 Abnormal electrocardiogram [ECG] [EKG]: Secondary | ICD-10-CM | POA: Diagnosis not present

## 2017-11-25 DIAGNOSIS — Z01812 Encounter for preprocedural laboratory examination: Secondary | ICD-10-CM | POA: Diagnosis present

## 2017-11-25 DIAGNOSIS — I10 Essential (primary) hypertension: Secondary | ICD-10-CM | POA: Insufficient documentation

## 2017-11-25 HISTORY — DX: Vomiting, unspecified: R11.10

## 2017-11-25 LAB — URINALYSIS, ROUTINE W REFLEX MICROSCOPIC
BILIRUBIN URINE: NEGATIVE
Glucose, UA: NEGATIVE mg/dL
HGB URINE DIPSTICK: NEGATIVE
Ketones, ur: NEGATIVE mg/dL
NITRITE: NEGATIVE
PH: 6 (ref 5.0–8.0)
Protein, ur: 100 mg/dL — AB
SPECIFIC GRAVITY, URINE: 1.017 (ref 1.005–1.030)

## 2017-11-25 LAB — COMPREHENSIVE METABOLIC PANEL
ALBUMIN: 3.5 g/dL (ref 3.5–5.0)
ALT: 28 U/L (ref 14–54)
AST: 30 U/L (ref 15–41)
Alkaline Phosphatase: 107 U/L (ref 38–126)
Anion gap: 11 (ref 5–15)
BILIRUBIN TOTAL: 0.6 mg/dL (ref 0.3–1.2)
BUN: 26 mg/dL — AB (ref 6–20)
CHLORIDE: 104 mmol/L (ref 101–111)
CO2: 22 mmol/L (ref 22–32)
Calcium: 8.9 mg/dL (ref 8.9–10.3)
Creatinine, Ser: 1.07 mg/dL — ABNORMAL HIGH (ref 0.44–1.00)
GFR calc Af Amer: 54 mL/min — ABNORMAL LOW (ref >60)
GFR calc non Af Amer: 47 mL/min — ABNORMAL LOW (ref >60)
GLUCOSE: 145 mg/dL — AB (ref 65–99)
POTASSIUM: 3.4 mmol/L — AB (ref 3.5–5.1)
SODIUM: 137 mmol/L (ref 135–145)
TOTAL PROTEIN: 7.7 g/dL (ref 6.5–8.1)

## 2017-11-25 LAB — SURGICAL PCR SCREEN
MRSA, PCR: NEGATIVE
Staphylococcus aureus: NEGATIVE

## 2017-11-25 LAB — CBC
HCT: 34.2 % — ABNORMAL LOW (ref 35.0–47.0)
Hemoglobin: 11.9 g/dL — ABNORMAL LOW (ref 12.0–16.0)
MCH: 30.7 pg (ref 26.0–34.0)
MCHC: 34.9 g/dL (ref 32.0–36.0)
MCV: 87.8 fL (ref 80.0–100.0)
PLATELETS: 373 10*3/uL (ref 150–440)
RBC: 3.89 MIL/uL (ref 3.80–5.20)
RDW: 13.9 % (ref 11.5–14.5)
WBC: 7.8 10*3/uL (ref 3.6–11.0)

## 2017-11-25 LAB — PROTIME-INR
INR: 1.09
Prothrombin Time: 14 seconds (ref 11.4–15.2)

## 2017-11-25 LAB — APTT: APTT: 29 s (ref 24–36)

## 2017-11-25 NOTE — Patient Instructions (Signed)
Your procedure is scheduled on:12/02/17 Report to Day Surgery. [facility] SECOND FLOOR To find out your arrival time please call [phone] between 1PM - 3PM on 11/29/17.  Remember: Instructions that are not followed completely may result in serious medical risk, up to and including death, or upon the discretion of your surgeon and anesthesiologist your surgery may need to be rescheduled.     _X__ 1. Do not eat food after midnight the night before your procedure.                 No gum chewing or hard candies. You may drink clear liquids up to 2 hours                 before you are scheduled to arrive for your surgery- DO not drink clear                 liquids within 2 hours of the start of your surgery.                 Clear Liquids include:  water, apple juice without pulp, clear carbohydrate                 drink such as Clearfast of Gart[place]ade, Black Coffee or Tea (Do not add                 anything to coffee or tea).  __X__2.  On the morning of surgery brush your teeth with toothpaste and water, you                 may rinse your mouth with mouthwash if you wish.  Do not swallow any              toothpaste of mouthwash.     _X__ 3.  No Alcohol for 24 hours before or after surgery.   _X__ 4.  Do Not Smoke or use e-cigarettes For 24 Hours Prior to Your Surgery.                 Do not use any chewable tobacco products for at least 6 hours prior to                 surgery.  ____  5.  Bring all medications with you on the day of surgery if instructed.   _X___  6.  Notify your doctor if there is any change in your medical condition      (cold, fever, infections).     Do not wear jewelry, make-up, hairpins, clips or nail polish. Do not wear lotions, powders, or perfumes. You may wear deodorant. Do not shave 48 hours prior to surgery. Men may shave face and neck. Do not bring valuables to the hospital.    [facility] is not responsible for any belongings or  valuables.  Contacts, dentures or bridgework may not be worn into surgery. Leave your suitcase in the car. After surgery it may be brought to your room. For patients admitted to the hospital, discharge time is determined by your treatment team.   Patients discharged the day of surgery will not be allowed to drive home.   Please read over the following fact sheets that you were given:   Surgical Site Infection Prevention / MRSA / SPIROMETRY   _X___ Take these medicines the morning of surgery with A SIP OF WATER:    1. MAGNESIUM OXIDE  2.   3.   4.  5.  6.  ____ Fleet Enema (as directed)   _X___ Use CHG Soap as directed  __X__ Use inhalers on the day of surgery   AND BRING  __X__ Stop metformin 2 days prior to surgery    ____ Take 1/2 of usual insulin dose the night before surgery. No insulin the morning          of surgery.   ____ Stop Coumadin/Plavix/aspirin on   ____ Stop Anti-inflammatories on    ____ Stop supplements until after surgery.    ____ Bring C-Pap to the hospital.

## 2017-11-26 NOTE — Pre-Procedure Instructions (Signed)
Met B, CBC, and UA sent to Dr. Harlow Mares and Anesthesia for review.

## 2017-11-26 NOTE — Pre-Procedure Instructions (Signed)
EKG C/W 2014

## 2017-12-01 MED ORDER — CEFAZOLIN SODIUM-DEXTROSE 2-4 GM/100ML-% IV SOLN
2.0000 g | INTRAVENOUS | Status: AC
  Administered 2017-12-02: 2 g via INTRAVENOUS

## 2017-12-01 MED ORDER — TRANEXAMIC ACID 1000 MG/10ML IV SOLN
1000.0000 mg | INTRAVENOUS | Status: AC
  Administered 2017-12-02: 1000 mg via INTRAVENOUS
  Filled 2017-12-01: qty 10

## 2017-12-02 ENCOUNTER — Inpatient Hospital Stay
Admission: RE | Admit: 2017-12-02 | Discharge: 2017-12-04 | DRG: 468 | Disposition: A | Discharge status: Home / Self Care | Payer: Medicare Other | Source: Ambulatory Visit | Attending: Orthopedic Surgery | Admitting: Orthopedic Surgery

## 2017-12-02 ENCOUNTER — Inpatient Hospital Stay: Payer: Medicare Other | Admitting: Anesthesiology

## 2017-12-02 ENCOUNTER — Other Ambulatory Visit: Payer: Self-pay

## 2017-12-02 ENCOUNTER — Encounter: Payer: Self-pay | Admitting: *Deleted

## 2017-12-02 ENCOUNTER — Encounter
Admission: RE | Disposition: A | Discharge status: Home / Self Care | Payer: Self-pay | Source: Ambulatory Visit | Attending: Orthopedic Surgery

## 2017-12-02 ENCOUNTER — Inpatient Hospital Stay: Payer: Medicare Other

## 2017-12-02 DIAGNOSIS — T8484XA Pain due to internal orthopedic prosthetic devices, implants and grafts, initial encounter: Principal | ICD-10-CM | POA: Diagnosis present

## 2017-12-02 DIAGNOSIS — J449 Chronic obstructive pulmonary disease, unspecified: Secondary | ICD-10-CM | POA: Diagnosis present

## 2017-12-02 DIAGNOSIS — Z96619 Presence of unspecified artificial shoulder joint: Secondary | ICD-10-CM | POA: Diagnosis present

## 2017-12-02 DIAGNOSIS — M8580 Other specified disorders of bone density and structure, unspecified site: Secondary | ICD-10-CM | POA: Diagnosis present

## 2017-12-02 DIAGNOSIS — Z85828 Personal history of other malignant neoplasm of skin: Secondary | ICD-10-CM

## 2017-12-02 DIAGNOSIS — Z09 Encounter for follow-up examination after completed treatment for conditions other than malignant neoplasm: Secondary | ICD-10-CM

## 2017-12-02 DIAGNOSIS — Y792 Prosthetic and other implants, materials and accessory orthopedic devices associated with adverse incidents: Secondary | ICD-10-CM | POA: Diagnosis present

## 2017-12-02 DIAGNOSIS — M9689 Other intraoperative and postprocedural complications and disorders of the musculoskeletal system: Secondary | ICD-10-CM

## 2017-12-02 DIAGNOSIS — R011 Cardiac murmur, unspecified: Secondary | ICD-10-CM | POA: Diagnosis present

## 2017-12-02 DIAGNOSIS — E119 Type 2 diabetes mellitus without complications: Secondary | ICD-10-CM | POA: Diagnosis present

## 2017-12-02 DIAGNOSIS — Z96642 Presence of left artificial hip joint: Secondary | ICD-10-CM | POA: Diagnosis present

## 2017-12-02 DIAGNOSIS — Z79899 Other long term (current) drug therapy: Secondary | ICD-10-CM

## 2017-12-02 DIAGNOSIS — I10 Essential (primary) hypertension: Secondary | ICD-10-CM | POA: Diagnosis present

## 2017-12-02 DIAGNOSIS — Z7984 Long term (current) use of oral hypoglycemic drugs: Secondary | ICD-10-CM | POA: Diagnosis not present

## 2017-12-02 DIAGNOSIS — Z7951 Long term (current) use of inhaled steroids: Secondary | ICD-10-CM | POA: Diagnosis not present

## 2017-12-02 DIAGNOSIS — M2480 Other specific joint derangements of unspecified joint, not elsewhere classified: Secondary | ICD-10-CM | POA: Diagnosis present

## 2017-12-02 DIAGNOSIS — Z972 Presence of dental prosthetic device (complete) (partial): Secondary | ICD-10-CM | POA: Diagnosis not present

## 2017-12-02 DIAGNOSIS — Z96653 Presence of artificial knee joint, bilateral: Secondary | ICD-10-CM | POA: Diagnosis present

## 2017-12-02 DIAGNOSIS — Z79891 Long term (current) use of opiate analgesic: Secondary | ICD-10-CM | POA: Diagnosis not present

## 2017-12-02 DIAGNOSIS — M1990 Unspecified osteoarthritis, unspecified site: Secondary | ICD-10-CM | POA: Diagnosis present

## 2017-12-02 HISTORY — PX: TOTAL HIP REVISION: SHX763

## 2017-12-02 LAB — BASIC METABOLIC PANEL
ANION GAP: 10 (ref 5–15)
BUN: 28 mg/dL — AB (ref 6–20)
CHLORIDE: 101 mmol/L (ref 101–111)
CO2: 21 mmol/L — ABNORMAL LOW (ref 22–32)
Calcium: 8.7 mg/dL — ABNORMAL LOW (ref 8.9–10.3)
Creatinine, Ser: 1.5 mg/dL — ABNORMAL HIGH (ref 0.44–1.00)
GFR, EST AFRICAN AMERICAN: 36 mL/min — AB (ref >60)
GFR, EST NON AFRICAN AMERICAN: 31 mL/min — AB (ref >60)
Glucose, Bld: 173 mg/dL — ABNORMAL HIGH (ref 65–99)
POTASSIUM: 3.9 mmol/L (ref 3.5–5.1)
SODIUM: 132 mmol/L — AB (ref 135–145)

## 2017-12-02 LAB — GLUCOSE, CAPILLARY
Glucose-Capillary: 148 mg/dL — ABNORMAL HIGH (ref 65–99)
Glucose-Capillary: 188 mg/dL — ABNORMAL HIGH (ref 65–99)
Glucose-Capillary: 297 mg/dL — ABNORMAL HIGH (ref 65–99)

## 2017-12-02 SURGERY — TOTAL HIP REVISION
Anesthesia: Spinal | Site: Hip | Laterality: Left | Wound class: Clean

## 2017-12-02 MED ORDER — BACITRACIN 50000 UNITS IM SOLR
INTRAMUSCULAR | Status: DC | PRN
  Administered 2017-12-02: 1500 mL

## 2017-12-02 MED ORDER — BISACODYL 10 MG RE SUPP: 10.0000 mg | Freq: Every day | RECTAL | Status: DC | PRN

## 2017-12-02 MED ORDER — PROPOFOL 500 MG/50ML IV EMUL
INTRAVENOUS | Status: AC
  Filled 2017-12-02: qty 50

## 2017-12-02 MED ORDER — LIDOCAINE HCL (PF) 2 % IJ SOLN
INTRAMUSCULAR | Status: AC
  Filled 2017-12-02: qty 10

## 2017-12-02 MED ORDER — GABAPENTIN 300 MG PO CAPS
ORAL_CAPSULE | ORAL | Status: AC
  Administered 2017-12-02: 300 mg via ORAL
  Filled 2017-12-02: qty 1

## 2017-12-02 MED ORDER — ASPIRIN 81 MG PO CHEW
81.0000 mg | CHEWABLE_TABLET | Freq: Two times a day (BID) | ORAL | Status: DC
  Administered 2017-12-02 – 2017-12-03 (×3): 81 mg via ORAL
  Filled 2017-12-02 (×4): qty 1

## 2017-12-02 MED ORDER — HYDROCODONE-ACETAMINOPHEN 7.5-325 MG PO TABS: 1.0000 | ORAL_TABLET | ORAL | Status: DC | PRN

## 2017-12-02 MED ORDER — BUPIVACAINE-EPINEPHRINE (PF) 0.25% -1:200000 IJ SOLN
INTRAMUSCULAR | Status: DC | PRN
  Administered 2017-12-02: 20 mL

## 2017-12-02 MED ORDER — ACETAMINOPHEN 500 MG PO TABS
1000.0000 mg | ORAL_TABLET | Freq: Once | ORAL | Status: AC
  Administered 2017-12-02: 1000 mg via ORAL

## 2017-12-02 MED ORDER — SODIUM CHLORIDE 0.9 % IV SOLN
INTRAVENOUS | Status: DC | PRN
  Administered 2017-12-02: 30 ug/min via INTRAVENOUS

## 2017-12-02 MED ORDER — ACETAMINOPHEN 500 MG PO TABS
500.0000 mg | ORAL_TABLET | Freq: Four times a day (QID) | ORAL | Status: AC
  Administered 2017-12-02 (×2): 500 mg via ORAL
  Filled 2017-12-02 (×4): qty 1

## 2017-12-02 MED ORDER — ACETAMINOPHEN 500 MG PO TABS
ORAL_TABLET | ORAL | Status: AC
  Administered 2017-12-02: 1000 mg via ORAL
  Filled 2017-12-02: qty 2

## 2017-12-02 MED ORDER — CHLORHEXIDINE GLUCONATE 4 % EX LIQD: 60.0000 mL | Freq: Once | CUTANEOUS | Status: DC

## 2017-12-02 MED ORDER — FENTANYL CITRATE (PF) 100 MCG/2ML IJ SOLN: 25.0000 ug | INTRAMUSCULAR | Status: DC | PRN

## 2017-12-02 MED ORDER — FAMOTIDINE 20 MG PO TABS
ORAL_TABLET | ORAL | Status: AC
  Administered 2017-12-02: 20 mg via ORAL
  Filled 2017-12-02: qty 1

## 2017-12-02 MED ORDER — HYDROCODONE-ACETAMINOPHEN 5-325 MG PO TABS
1.0000 | ORAL_TABLET | ORAL | Status: DC | PRN
  Administered 2017-12-02 – 2017-12-03 (×3): 1 via ORAL
  Filled 2017-12-02 (×2): qty 1

## 2017-12-02 MED ORDER — SODIUM CHLORIDE 0.9 % IV SOLN
INTRAVENOUS | Status: DC
  Administered 2017-12-02: 06:00:00 via INTRAVENOUS

## 2017-12-02 MED ORDER — MENTHOL 3 MG MT LOZG
1.0000 | LOZENGE | OROMUCOSAL | Status: DC | PRN
  Filled 2017-12-02: qty 9

## 2017-12-02 MED ORDER — CEFAZOLIN SODIUM-DEXTROSE 1-4 GM/50ML-% IV SOLN
1.0000 g | Freq: Four times a day (QID) | INTRAVENOUS | Status: AC
  Administered 2017-12-02 (×2): 1 g via INTRAVENOUS
  Filled 2017-12-02 (×2): qty 50

## 2017-12-02 MED ORDER — MAGNESIUM OXIDE 400 (241.3 MG) MG PO TABS
400.0000 mg | ORAL_TABLET | Freq: Every day | ORAL | Status: DC
  Administered 2017-12-03 – 2017-12-04 (×2): 400 mg via ORAL
  Filled 2017-12-02 (×2): qty 1

## 2017-12-02 MED ORDER — TETRACAINE HCL 1 % IJ SOLN
INTRAMUSCULAR | Status: DC | PRN
  Administered 2017-12-02: 5 mg via INTRASPINAL

## 2017-12-02 MED ORDER — BACITRACIN 50000 UNITS IM SOLR
INTRAMUSCULAR | Status: AC
  Filled 2017-12-02: qty 3

## 2017-12-02 MED ORDER — SEVOFLURANE IN SOLN
RESPIRATORY_TRACT | Status: AC
  Filled 2017-12-02: qty 250

## 2017-12-02 MED ORDER — MOMETASONE FURO-FORMOTEROL FUM 200-5 MCG/ACT IN AERO
2.0000 | INHALATION_SPRAY | Freq: Two times a day (BID) | RESPIRATORY_TRACT | Status: DC
  Administered 2017-12-02 – 2017-12-04 (×4): 2 via RESPIRATORY_TRACT
  Filled 2017-12-02: qty 8.8

## 2017-12-02 MED ORDER — GABAPENTIN 300 MG PO CAPS
300.0000 mg | ORAL_CAPSULE | Freq: Once | ORAL | Status: AC
  Administered 2017-12-02: 300 mg via ORAL

## 2017-12-02 MED ORDER — KETOROLAC TROMETHAMINE 15 MG/ML IJ SOLN
7.5000 mg | Freq: Four times a day (QID) | INTRAMUSCULAR | Status: AC
  Administered 2017-12-02: 7.5 mg via INTRAVENOUS
  Filled 2017-12-02 (×2): qty 1

## 2017-12-02 MED ORDER — BUPIVACAINE-EPINEPHRINE (PF) 0.25% -1:200000 IJ SOLN
INTRAMUSCULAR | Status: AC
  Filled 2017-12-02: qty 30

## 2017-12-02 MED ORDER — ONDANSETRON HCL 4 MG/2ML IJ SOLN: 4.0000 mg | Freq: Once | INTRAMUSCULAR | Status: DC | PRN

## 2017-12-02 MED ORDER — ONDANSETRON HCL 4 MG PO TABS: 4.0000 mg | ORAL_TABLET | Freq: Four times a day (QID) | ORAL | Status: DC | PRN

## 2017-12-02 MED ORDER — PHENYLEPHRINE HCL 10 MG/ML IJ SOLN
INTRAMUSCULAR | Status: AC
  Filled 2017-12-02: qty 1

## 2017-12-02 MED ORDER — BUPIVACAINE HCL (PF) 0.5 % IJ SOLN
INTRAMUSCULAR | Status: AC
  Filled 2017-12-02: qty 10

## 2017-12-02 MED ORDER — METFORMIN HCL 500 MG PO TABS
500.0000 mg | ORAL_TABLET | Freq: Two times a day (BID) | ORAL | Status: DC
  Administered 2017-12-02 – 2017-12-03 (×3): 500 mg via ORAL
  Filled 2017-12-02 (×6): qty 1

## 2017-12-02 MED ORDER — GABAPENTIN 300 MG PO CAPS
300.0000 mg | ORAL_CAPSULE | Freq: Three times a day (TID) | ORAL | Status: DC
  Administered 2017-12-02 – 2017-12-04 (×6): 300 mg via ORAL
  Filled 2017-12-02 (×6): qty 1

## 2017-12-02 MED ORDER — LACTATED RINGERS IV SOLN: INTRAVENOUS | Status: DC

## 2017-12-02 MED ORDER — PROPOFOL 500 MG/50ML IV EMUL
INTRAVENOUS | Status: DC | PRN
  Administered 2017-12-02: 40 ug/kg/min via INTRAVENOUS

## 2017-12-02 MED ORDER — METOCLOPRAMIDE HCL 5 MG/ML IJ SOLN: 5.0000 mg | Freq: Three times a day (TID) | INTRAMUSCULAR | Status: DC | PRN

## 2017-12-02 MED ORDER — FAMOTIDINE 20 MG PO TABS
20.0000 mg | ORAL_TABLET | Freq: Once | ORAL | Status: AC
  Administered 2017-12-02: 20 mg via ORAL

## 2017-12-02 MED ORDER — AMLODIPINE BESYLATE 10 MG PO TABS
10.0000 mg | ORAL_TABLET | Freq: Every day | ORAL | Status: DC
  Administered 2017-12-02 – 2017-12-04 (×3): 10 mg via ORAL
  Filled 2017-12-02: qty 1

## 2017-12-02 MED ORDER — SODIUM CHLORIDE FLUSH 0.9 % IV SOLN
INTRAVENOUS | Status: AC
  Filled 2017-12-02: qty 40

## 2017-12-02 MED ORDER — BACITRACIN 50000 UNITS IM SOLR
INTRAMUSCULAR | Status: AC
  Filled 2017-12-02: qty 1

## 2017-12-02 MED ORDER — TRAMADOL HCL 50 MG PO TABS
50.0000 mg | ORAL_TABLET | Freq: Four times a day (QID) | ORAL | Status: DC
  Administered 2017-12-02 – 2017-12-04 (×5): 50 mg via ORAL
  Filled 2017-12-02 (×6): qty 1

## 2017-12-02 MED ORDER — HYDROCODONE-ACETAMINOPHEN 5-325 MG PO TABS
ORAL_TABLET | ORAL | Status: AC
  Filled 2017-12-02: qty 1

## 2017-12-02 MED ORDER — MAGNESIUM CITRATE PO SOLN
1.0000 | Freq: Once | ORAL | Status: DC | PRN
  Filled 2017-12-02 (×2): qty 296

## 2017-12-02 MED ORDER — VITAMIN B-12 1000 MCG PO TABS
2500.0000 ug | ORAL_TABLET | Freq: Every day | ORAL | Status: DC
  Administered 2017-12-03 – 2017-12-04 (×2): 2500 ug via ORAL
  Filled 2017-12-02 (×2): qty 3

## 2017-12-02 MED ORDER — DOCUSATE SODIUM 100 MG PO CAPS
100.0000 mg | ORAL_CAPSULE | Freq: Two times a day (BID) | ORAL | Status: DC
  Administered 2017-12-02 – 2017-12-04 (×4): 100 mg via ORAL
  Filled 2017-12-02 (×4): qty 1

## 2017-12-02 MED ORDER — PHENOL 1.4 % MT LIQD
1.0000 | OROMUCOSAL | Status: DC | PRN
  Filled 2017-12-02: qty 177

## 2017-12-02 MED ORDER — BUPIVACAINE HCL (PF) 0.5 % IJ SOLN
INTRAMUSCULAR | Status: DC | PRN
  Administered 2017-12-02: 2.5 mL

## 2017-12-02 MED ORDER — ACETAMINOPHEN 10 MG/ML IV SOLN
INTRAVENOUS | Status: AC
  Filled 2017-12-02: qty 100

## 2017-12-02 MED ORDER — CEFAZOLIN SODIUM-DEXTROSE 2-4 GM/100ML-% IV SOLN
INTRAVENOUS | Status: AC
  Filled 2017-12-02: qty 100

## 2017-12-02 MED ORDER — ACETAMINOPHEN 325 MG PO TABS: 325.0000 mg | ORAL_TABLET | Freq: Four times a day (QID) | ORAL | Status: DC | PRN

## 2017-12-02 MED ORDER — MORPHINE SULFATE (PF) 2 MG/ML IV SOLN: 0.5000 mg | INTRAVENOUS | Status: DC | PRN

## 2017-12-02 MED ORDER — PROPOFOL 10 MG/ML IV BOLUS
INTRAVENOUS | Status: DC | PRN
  Administered 2017-12-02 (×3): 22 mg via INTRAVENOUS

## 2017-12-02 MED ORDER — ADULT MULTIVITAMIN W/MINERALS CH
1.0000 | ORAL_TABLET | Freq: Every day | ORAL | Status: DC
  Administered 2017-12-03 – 2017-12-04 (×2): 1 via ORAL
  Filled 2017-12-02 (×2): qty 1

## 2017-12-02 MED ORDER — LACTATED RINGERS IV SOLN
INTRAVENOUS | Status: DC
  Administered 2017-12-02: 14:00:00 via INTRAVENOUS

## 2017-12-02 MED ORDER — ONDANSETRON HCL 4 MG/2ML IJ SOLN
4.0000 mg | Freq: Four times a day (QID) | INTRAMUSCULAR | Status: DC | PRN
  Administered 2017-12-03: 4 mg via INTRAVENOUS
  Filled 2017-12-02 (×2): qty 2

## 2017-12-02 MED ORDER — METOCLOPRAMIDE HCL 10 MG PO TABS: 5.0000 mg | ORAL_TABLET | Freq: Three times a day (TID) | ORAL | Status: DC | PRN

## 2017-12-02 SURGICAL SUPPLY — 72 items
BAG DECANTER FOR FLEXI CONT (MISCELLANEOUS) IMPLANT
BANDAGE ACE 6X5 VEL STRL LF (GAUZE/BANDAGES/DRESSINGS) ×3 IMPLANT
BLADE BOVIE TIP EXT 4 (BLADE) ×3 IMPLANT
BLADE SAW 1 (BLADE) ×3 IMPLANT
BNDG COHESIVE 6X5 TAN STRL LF (GAUZE/BANDAGES/DRESSINGS) ×3 IMPLANT
CANISTER SUCT 1200ML W/VALVE (MISCELLANEOUS) ×3 IMPLANT
CANISTER SUCT 3000ML PPV (MISCELLANEOUS) ×6 IMPLANT
CHLORAPREP W/TINT 26ML (MISCELLANEOUS) ×6 IMPLANT
CRADLE LAMINECT ARM (MISCELLANEOUS) ×3 IMPLANT
DRAPE IMP U-DRAPE 54X76 (DRAPES) ×3 IMPLANT
DRAPE INCISE IOBAN 66X60 STRL (DRAPES) ×3 IMPLANT
DRAPE SHEET LG 3/4 BI-LAMINATE (DRAPES) ×3 IMPLANT
DRAPE SURG 17X11 SM STRL (DRAPES) ×3 IMPLANT
DRAPE TABLE BACK 80X90 (DRAPES) ×3 IMPLANT
DRAPE U-SHAPE 47X51 STRL (DRAPES) ×3 IMPLANT
DRSG AQUACEL AG ADV 3.5X10 (GAUZE/BANDAGES/DRESSINGS) ×3 IMPLANT
DRSG AQUACEL AG ADV 3.5X14 (GAUZE/BANDAGES/DRESSINGS) ×3 IMPLANT
DRSG OPSITE POSTOP 3X4 (GAUZE/BANDAGES/DRESSINGS) ×3 IMPLANT
ELECT BLADE 6.5 EXT (BLADE) ×3 IMPLANT
ELECT CAUTERY BLADE 6.4 (BLADE) ×3 IMPLANT
ELECT REM PT RETURN 9FT ADLT (ELECTROSURGICAL) ×3
ELECTRODE REM PT RTRN 9FT ADLT (ELECTROSURGICAL) ×1 IMPLANT
GAUZE PACK 2X3YD (MISCELLANEOUS) IMPLANT
GAUZE PETRO XEROFOAM 1X8 (MISCELLANEOUS) ×3 IMPLANT
GAUZE XEROFORM 4X4 STRL (GAUZE/BANDAGES/DRESSINGS) ×3 IMPLANT
GLOVE INDICATOR 8.0 STRL GRN (GLOVE) ×3 IMPLANT
GLOVE SURG ORTHO 8.0 STRL STRW (GLOVE) ×9 IMPLANT
GOWN STRL REUS W/ TWL LRG LVL3 (GOWN DISPOSABLE) ×1 IMPLANT
GOWN STRL REUS W/ TWL XL LVL3 (GOWN DISPOSABLE) ×2 IMPLANT
GOWN STRL REUS W/TWL LRG LVL3 (GOWN DISPOSABLE) ×2
GOWN STRL REUS W/TWL XL LVL3 (GOWN DISPOSABLE) ×4
HANDLE YANKAUER SUCT BULB TIP (MISCELLANEOUS) ×3 IMPLANT
HEAD FEMORAL HIP (Head) ×3 IMPLANT
HEMOVAC 400CC 10FR (MISCELLANEOUS) IMPLANT
HOLDER FOLEY CATH W/STRAP (MISCELLANEOUS) ×3 IMPLANT
HOOD PEEL AWAY FLYTE STAYCOOL (MISCELLANEOUS) ×9 IMPLANT
IV NS 100ML SINGLE PACK (IV SOLUTION) IMPLANT
KIT TURNOVER KIT A (KITS) ×3 IMPLANT
LINER ACETAB HIP 10D 36MM E (Liner) ×1 IMPLANT
LINER HIP ACETAB 10D 36MM E (Liner) ×3 IMPLANT
NDL SAFETY ECLIPSE 18X1.5 (NEEDLE) ×1 IMPLANT
NEEDLE HYPO 18GX1.5 SHARP (NEEDLE) ×2
NEEDLE HYPO 22GX1.5 SAFETY (NEEDLE) ×3 IMPLANT
NEEDLE SPNL 20GX3.5 QUINCKE YW (NEEDLE) ×3 IMPLANT
NS IRRIG 1000ML POUR BTL (IV SOLUTION) ×3 IMPLANT
PACK HIP PROSTHESIS (MISCELLANEOUS) ×3 IMPLANT
PILLOW ABDUCTION FOAM SM (MISCELLANEOUS) IMPLANT
PILLOW ABDUCTION MEDIUM (MISCELLANEOUS) ×3 IMPLANT
PRESSURIZER CEMENT PROX FEM SM (MISCELLANEOUS) IMPLANT
PRESSURIZER FEM CANAL M (MISCELLANEOUS) IMPLANT
PULSAVAC PLUS IRRIG FAN TIP (DISPOSABLE) ×3
RETRIEVER SUT HEWSON (MISCELLANEOUS) ×3 IMPLANT
SCREW OSTEO 30MM (Screw) ×3 IMPLANT
SHELL TRIDENT PSL ACETABULAR (Orthopedic Implant) ×3 IMPLANT
SOL .9 NS 3000ML IRR  AL (IV SOLUTION) ×2
SOL .9 NS 3000ML IRR UROMATIC (IV SOLUTION) ×1 IMPLANT
STAPLER SKIN PROX 35W (STAPLE) ×3 IMPLANT
SUT ETHIBOND #5 BRAIDED 30INL (SUTURE) ×3 IMPLANT
SUT MERSILENE 5MM BP 1 12 (SUTURE) ×6 IMPLANT
SUT QUILL PDO 0 36 36 VIOLET (SUTURE) ×6 IMPLANT
SUT VIC AB 1 CT1 18XCR BRD 8 (SUTURE) ×1 IMPLANT
SUT VIC AB 1 CT1 8-18 (SUTURE) ×2
SUT VIC AB 2-0 CT1 18 (SUTURE) ×6 IMPLANT
SUT VIC AB 2-0 CT1 27 (SUTURE) ×2
SUT VIC AB 2-0 CT1 TAPERPNT 27 (SUTURE) ×1 IMPLANT
SYR 20CC LL (SYRINGE) ×3 IMPLANT
SYR TB 1ML 27GX1/2 LL (SYRINGE) IMPLANT
TAPE TRANSPORE STRL 2 31045 (GAUZE/BANDAGES/DRESSINGS) ×3 IMPLANT
TIP BRUSH PULSAVAC PLUS 24.33 (MISCELLANEOUS) ×3 IMPLANT
TIP FAN IRRIG PULSAVAC PLUS (DISPOSABLE) ×1 IMPLANT
TOWER CARTRIDGE SMART MIX (DISPOSABLE) IMPLANT
TRAY FOLEY CATH SILVER 16FR LF (SET/KITS/TRAYS/PACK) ×3 IMPLANT

## 2017-12-02 NOTE — Anesthesia Procedure Notes (Signed)
Spinal  Patient location during procedure: OR Start time: 12/02/2017 7:38 AM End time: 12/02/2017 7:50 AM Staffing Anesthesiologist: Gunnar Bulla, MD Resident/CRNA: Eben Burow, CRNA Performed: resident/CRNA  Preanesthetic Checklist Completed: patient identified, site marked, surgical consent, pre-op evaluation, timeout performed, IV checked, risks and benefits discussed and monitors and equipment checked Spinal Block Patient position: sitting Prep: Betadine Patient monitoring: heart rate, continuous pulse ox and blood pressure Approach: midline Location: L3-4 Injection technique: single-shot Needle Needle type: Pencan  Needle gauge: 24 G Needle length: 10 cm Assessment Sensory level: T8

## 2017-12-02 NOTE — Anesthesia Preprocedure Evaluation (Signed)
Anesthesia Evaluation  Patient identified by MRN, date of birth, ID band Patient awake    Reviewed: Allergy & Precautions, NPO status , Patient's Chart, lab work & pertinent test results, reviewed documented beta blocker date and time   Airway Mallampati: II  TM Distance: >3 FB     Dental  (+) Chipped, Upper Dentures, Lower Dentures   Pulmonary shortness of breath, asthma , COPD,           Cardiovascular hypertension, Pt. on medications + Valvular Problems/Murmurs      Neuro/Psych    GI/Hepatic   Endo/Other  diabetes, Type 2  Renal/GU      Musculoskeletal  (+) Arthritis ,   Abdominal   Peds  Hematology   Anesthesia Other Findings   Reproductive/Obstetrics                             Anesthesia Physical Anesthesia Plan  ASA: III  Anesthesia Plan: Spinal   Post-op Pain Management:    Induction:   PONV Risk Score and Plan:   Airway Management Planned:   Additional Equipment:   Intra-op Plan:   Post-operative Plan:   Informed Consent: I have reviewed the patients History and Physical, chart, labs and discussed the procedure including the risks, benefits and alternatives for the proposed anesthesia with the patient or authorized representative who has indicated his/her understanding and acceptance.     Plan Discussed with: CRNA  Anesthesia Plan Comments:         Anesthesia Quick Evaluation

## 2017-12-02 NOTE — Evaluation (Signed)
Physical Therapy Evaluation Patient Details Name: Leslie Parker MRN: [id number] DOB: 07/24/1932 Today's Date: 12/02/2017   History of Present Illness  Pt is a 84 y/o F s/p conversion of hemiarthroplasty to total hip replacement LLE.  Pt's PMH includes cancer.   Clinical Impression  Pt is s/p L THA resulting in the deficits listed below (see PT Problem List). Pt has difficulty recalling hip precautions despite reviewing precautions several times during session.  Pt currently requires min assist for supine>sit and min guard for sit>stand and to ambulate in room.  Pt will have her daughter staying with her 24/7 until 5/17 at which point her granddaughter and sister who live nearby will be checking on her throughout the day.  Pt will benefit from skilled PT to increase their independence and safety with mobility to allow discharge to the venue listed below.     Follow Up Recommendations Home health PT    Equipment Recommendations  None recommended by PT    Recommendations for Other Services       Precautions / Restrictions Precautions Precautions: Fall;Posterior Hip Precaution Booklet Issued: Yes (comment) Precaution Comments: Provided pt with handout andeducated pt on posterior precautions.  Pt recalled 2/3 hip precautions at end of session after reviewing precautions multiple times during session.  Restrictions Weight Bearing Restrictions: Yes LLE Weight Bearing: Weight bearing as tolerated      Mobility  Bed Mobility Overal bed mobility: Needs Assistance Bed Mobility: Supine to Sit     Supine to sit: Min assist;HOB elevated     General bed mobility comments: Assist to advance LLE to EOB and to elevate trunk.  Pt uses bed rail to assist in pulling from supine>sit.   Transfers Overall transfer level: Needs assistance Equipment used: Rolling walker (2 wheeled) Transfers: Sit to/from Stand Sit to Stand: Min guard;From elevated surface         General transfer comment: Bed  elevated to adhere to hip precautions.  Pt is slow to rise but remains steady.  Cues for LLE positioning to sit to adhere to hip precautions.  Well controlled descent to sit.   Ambulation/Gait Ambulation/Gait assistance: Min guard Ambulation Distance (Feet): 30 Feet Assistive device: Rolling walker (2 wheeled) Gait Pattern/deviations: Step-to pattern;Decreased stride length;Decreased weight shift to left;Antalgic Gait velocity: decreased   General Gait Details: Cues to take smaller steps when turning to adhere to hip precautions.  No instability noted but pt reliant on BUE support on RW.  Flexed posture.   Stairs            Wheelchair Mobility    Modified Rankin (Stroke Patients Only)       Balance Overall balance assessment: Needs assistance Sitting-balance support: No upper extremity supported;Feet supported Sitting balance-[name]: Good     Standing balance support: Bilateral upper extremity supported;During functional activity Standing balance-[name]: Poor Standing balance comment: Pt requires UE support for static and dynamic activities                             Pertinent Vitals/Pain      Home Living Family/patient expects to be discharged to:: Private residence Living Arrangements: Alone Available Help at Discharge: Family;Available 24 hours/day(daughter 24/7 until 5/17) Type of Home: Apartment Home Access: Level entry     Home Layout: One level Home Equipment: Walker - 4 wheels;Cane - single point;Shower seat;Grab bars - toilet;Grab bars - tub/shower;Walker - 2 wheels Additional Comments: Daughter will be staying  with pt 24/7 until 5/17 and then returning to her home in [place].  Granddaughter lives nearby and will be able to check on the pt.  Sister lives nearby.      Prior Function Level of Independence: Independent with assistive device(s)         Comments: Pt has ambulated with a SPC for the past month due to pain.  No  falls in the past 6 months.  Independent with ADLs, IADLs.      Hand Dominance   Dominant Hand: Right    Extremity/Trunk Assessment   Upper Extremity Assessment Upper Extremity Assessment: Overall WFL for tasks assessed    Lower Extremity Assessment Lower Extremity Assessment: LLE deficits/detail LLE Deficits / Details: Limited strength and ROM as expected s/p surgery documented above.        Communication   Communication: No difficulties  Cognition Arousal/Alertness: Awake/alert Behavior During Therapy: WFL for tasks assessed/performed Overall Cognitive Status: Within Functional Limits for tasks assessed                                        General Comments General comments (skin integrity, edema, etc.): BP 159/79 supine, 151/109 sitting EOB after bed mobility, 152/78 sitting EOB after resting     Exercises Total Joint Exercises Ankle Circles/Pumps: AROM;Both;10 reps;Supine Quad Sets: Strengthening;Both;10 reps;Supine Gluteal Sets: Strengthening;Both;10 reps;Supine   Assessment/Plan    PT Assessment Patient needs continued PT services  PT Problem List Decreased strength;Decreased range of motion;Decreased activity tolerance;Decreased balance;Decreased knowledge of use of DME;Decreased safety awareness;Decreased knowledge of precautions;Pain       PT Treatment Interventions DME instruction;Gait training;Stair training;Functional mobility training;Therapeutic activities;Therapeutic exercise;Balance training;Neuromuscular re-education;Patient/family education;Modalities    PT Goals (Current goals can be found in the Care Plan section)  Acute Rehab PT Goals Patient Stated Goal: to go home at d/c PT Goal Formulation: With patient Time For Goal Achievement: 12/16/17 Potential to Achieve Goals: Good    Frequency BID   Barriers to discharge        Co-evaluation               AM-PAC PT "6 Clicks" Daily Activity  Outcome Measure Difficulty  turning over in bed (including adjusting bedclothes, sheets and blankets)?: Unable Difficulty moving from lying on back to sitting on the side of the bed? : Unable Difficulty sitting down on and standing up from a chair with arms (e.g., wheelchair, bedside commode, etc,.)?: A Little Help needed moving to and from a bed to chair (including a wheelchair)?: A Little Help needed walking in hospital room?: A Little Help needed climbing 3-5 steps with a railing? : A Little 6 Click Score: 14    End of Session Equipment Utilized During Treatment: Gait belt Activity Tolerance: Patient tolerated treatment well Patient left: in chair;with call bell/phone within reach;with chair alarm set;Other (comment)(abduction pillow in place) Nurse Communication: Mobility status;Precautions;Other (comment);Patient requests pain meds(BP readings) PT Visit Diagnosis: Pain;Unsteadiness on feet (R26.81);Other abnormalities of gait and mobility (R26.89);Muscle weakness (generalized) (M62.81);Difficulty in walking, not elsewhere classified (R26.2) Pain - Right/Left: Left Pain - part of body: Hip    Time: 0160-1093 PT Time Calculation (min) (ACUTE ONLY): 31 min   Charges:   PT Evaluation $PT Eval Low Complexity: 1 Low PT Treatments $Gait Training: 8-22 mins $Therapeutic Exercise: 8-22 mins   PT G Codes:       Collie Siad PT, DPT 12/02/2017,  4:11 PM

## 2017-12-02 NOTE — H&P (Signed)
The patient has been re-examined, and the chart reviewed, and there have been no interval changes to the documented history and physical.  Plan a left conversion from hemiarthroplasty to total hip arthroplasty today.  Anesthesia is not consulted regarding a peripheral nerve block for post-operative pain.  The risks, benefits, and alternatives have been discussed at length, and the patient is willing to proceed.

## 2017-12-02 NOTE — Op Note (Signed)
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 female who has a diagnosis of Z96.642 Presence of left artificial hip joint  M24.852 Oth specific joint derangements of left hip, NEC and elected for surgical management after failing conservative treatment.  The risks benefits and alternatives were discussed with the patient including but not limited to the risks of nonoperative treatment, versus surgical intervention including infection, bleeding, nerve injury, periprosthetic fracture, the need for revision surgery, dislocation, leg length discrepancy, blood clots, cardiopulmonary complications, morbidity, mortality, among others, and they were willing to proceed.     OPERATIVE REPORT     SURGEON: Elyn Aquas. Harlow Mares, MD    ASSISTANT:    Carlynn Spry  , [facility]  ANESTHESIA: Spinal    COMPLICATIONS:  None.   DRAINS: None  EBL:   50 mL                           COMPONENTS:  A Biolox delta 36 mm +5 mm head and a  acetabular shell size 52 mm with a Trident X3 10 degree polyethylene liner, one cancellous screw size 30 mm x 6.5 mm    PROCEDURE IN DETAIL:   The patient was met in the holding area and  identified.  The appropriate hip was identified and marked at the operative site.  The patient was then transported to the OR  and  placed under spinall anesthesia.  At that point, the patient was  placed in the lateral decubitus position with the operative side up and  secured to the operating room table and all bony prominences padded.     The operative lower extremity was prepped from the iliac crest to the distal  leg.  Sterile draping was performed.  Time out was performed prior to incision.      A routine posterolateral approach was utilized using a portion of the prior incision via sharp dissection  carried down to the subcutaneous tissue.  Gross bleeders were Bovie coagulated.  The iliotibial band was identified and incised along the length of the skin incision.  Self-retaining Charnley retractor was inserted.  Extensive scar tissue was sharply excised. With the hip internally rotated, the short external rotators  were identified, released, and tagged.  A Steinman pin was placed in the pelvis above the joint to evaluate leg length and bent appropriately  The hip capsule was released in a L-type fashion and the flaps tagged.    The hip was then dislocated posteriorly and the femoral neck was exposed.  The stem was evaluated and found to be stable.    I then exposed the deep acetabulum, cleared out significant scar and inflammatory tissue. The acetabulum had significant wear superior and posterior.  A wing retractor was placed.  After adequate visualization, I excised the labrum, and then sequentially reamed.  I placed the trial acetabulum, which seated nicely, and then impacted the real cup into place.  Appropriate version and inclination was confirmed clinically matching their bony anatomy, and also with the use of the jig.  A polyethylene liner was placed and the  wing retractor removed.    A trial head was utilized, and I reduced the hip and it was found to have excellent stability with functional range of motion. The trial components were then removed, and the femoral prosthesis put in place with appropriate version, slightly anteverted to the normal anatomy, and I impacted the real head ball into place. The hip was then reduced and taken through functional range of motion and found to have excellent stability. Leg lengths were restored.  I closed the T in the capsule. And repaired the posterior capsule with  5 mm Mersilene tape through drill holes.  I then irrigated the hip copiously again with pulse lavage, and repaired the fascia with #2 Quill, followed by 0 Quill for the subcutaneous tissue, and staples for the skin, Steri-Strips and Aquacel. An abduction pillow was placed. Sponge and needle counts were correct.  The patient was then awakened and returned to PACU in stable and satisfactory condition. There were no apparent complications.

## 2017-12-02 NOTE — Transfer of Care (Signed)
Immediate Anesthesia Transfer of Care Note  Patient: Leslie Parker  Procedure(s) Performed: TOTAL HIP REVISION (Left Hip)  Patient Location: PACU  Anesthesia Type:Spinal  Level of Consciousness: awake, alert , oriented and patient cooperative  Airway & Oxygen Therapy: Patient Spontanous Breathing  Post-op Assessment: Report given to RN and Post -op Vital signs reviewed and stable  Post vital signs: Reviewed and stable  Last Vitals:  Vitals Value Taken Time  BP 121/75 12/02/2017 10:12 AM  Temp 36.7 C 12/02/2017 10:12 AM  Pulse 83 12/02/2017 10:13 AM  Resp 14 12/02/2017 10:13 AM  SpO2 98 % 12/02/2017 10:13 AM  Vitals shown include unvalidated device data.  Last Pain:  Vitals:   12/02/17 1012  PainSc: 0-No pain         Complications: No apparent anesthesia complications

## 2017-12-02 NOTE — OR Nursing (Signed)
Explanted implants placed in biohazard bin per MD.

## 2017-12-02 NOTE — Anesthesia Post-op Follow-up Note (Signed)
Anesthesia QCDR form completed.        

## 2017-12-03 ENCOUNTER — Encounter: Payer: Self-pay | Admitting: Orthopedic Surgery

## 2017-12-03 LAB — BPAM RBC
BLOOD PRODUCT EXPIRATION DATE: 201905292359
BLOOD PRODUCT EXPIRATION DATE: 201906012359
Unit Type and Rh: 6200
Unit Type and Rh: 6200

## 2017-12-03 LAB — TYPE AND SCREEN
ABO/RH(D): A POS
Antibody Screen: NEGATIVE
UNIT DIVISION: 0
Unit division: 0

## 2017-12-03 LAB — BASIC METABOLIC PANEL
Anion gap: 4 — ABNORMAL LOW (ref 5–15)
BUN: 21 mg/dL — ABNORMAL HIGH (ref 6–20)
CALCIUM: 8 mg/dL — AB (ref 8.9–10.3)
CO2: 23 mmol/L (ref 22–32)
CREATININE: 1.15 mg/dL — AB (ref 0.44–1.00)
Chloride: 105 mmol/L (ref 101–111)
GFR calc Af Amer: 50 mL/min — ABNORMAL LOW (ref >60)
GFR calc non Af Amer: 43 mL/min — ABNORMAL LOW (ref >60)
GLUCOSE: 158 mg/dL — AB (ref 65–99)
Potassium: 4.9 mmol/L (ref 3.5–5.1)
Sodium: 132 mmol/L — ABNORMAL LOW (ref 135–145)

## 2017-12-03 LAB — GLUCOSE, CAPILLARY
Glucose-Capillary: 221 mg/dL — ABNORMAL HIGH (ref 65–99)
Glucose-Capillary: 82 mg/dL (ref 65–99)
Glucose-Capillary: 238 mg/dL — ABNORMAL HIGH (ref 65–99)
Glucose-Capillary: 121 mg/dL — ABNORMAL HIGH (ref 65–99)

## 2017-12-03 LAB — CBC
HEMATOCRIT: 30.4 % — AB (ref 35.0–47.0)
Hemoglobin: 10.5 g/dL — ABNORMAL LOW (ref 12.0–16.0)
MCH: 30.5 pg (ref 26.0–34.0)
MCHC: 34.4 g/dL (ref 32.0–36.0)
MCV: 88.7 fL (ref 80.0–100.0)
Platelets: 237 10*3/uL (ref 150–440)
RBC: 3.43 MIL/uL — ABNORMAL LOW (ref 3.80–5.20)
RDW: 13.4 % (ref 11.5–14.5)
WBC: 3.6 10*3/uL (ref 3.6–11.0)

## 2017-12-03 MED ORDER — INSULIN ASPART 100 UNIT/ML ~~LOC~~ SOLN
0.0000 [IU] | Freq: Three times a day (TID) | SUBCUTANEOUS | Status: DC
Start: 2017-12-03 — End: 2017-12-04
  Administered 2017-12-03 (×2): 5 [IU] via SUBCUTANEOUS
  Administered 2017-12-04: 2 [IU] via SUBCUTANEOUS
  Filled 2017-12-03 (×3): qty 1

## 2017-12-03 NOTE — Anesthesia Postprocedure Evaluation (Signed)
Anesthesia Post Note  Patient: EMELY FAHY  Procedure(s) Performed: TOTAL HIP REVISION (Left Hip)  Patient location during evaluation: Nursing Unit Anesthesia Type: Spinal Level of consciousness: awake, awake and alert, oriented and patient cooperative Pain management: pain level controlled Vital Signs Assessment: post-procedure vital signs reviewed and stable Respiratory status: spontaneous breathing, nonlabored ventilation and respiratory function stable Cardiovascular status: stable Postop Assessment: no headache, no backache, adequate PO intake, no apparent nausea or vomiting and patient able to bend at knees Anesthetic complications: no     Last Vitals:  Vitals:   12/03/17 0424 12/03/17 0756  BP: 133/67 (!) 151/89  Pulse: 87 96  Resp: 18   Temp: 36.8 C 37.4 C  SpO2: 97% 96%    Last Pain:  Vitals:   12/03/17 0756  TempSrc: Oral  PainSc:                  Ricki Miller

## 2017-12-03 NOTE — Progress Notes (Signed)
Physical Therapy Treatment Patient Details Name: Leslie Parker MRN: [id number] DOB: [birth year] Today's Date: 12/03/2017    History of Present Illness Pt is a 84 y/o F s/p conversion of hemiarthroplasty to total hip replacement LLE.  Pt's PMH includes cancer.     PT Comments    Pt is making good progress towards goals with increased ambulation distance performed this date. Able to improve to reciprocal gait pattern and responds to verbal commands well. Good endurance with there-ex and toilet transfers. Very motivated to return home. Will continue to progress.   Follow Up Recommendations  Home health PT     Equipment Recommendations  None recommended by PT    Recommendations for Other Services       Precautions / Restrictions Precautions Precautions: Fall;Posterior Hip Precaution Booklet Issued: Yes (comment) Precaution Comments: able to recall 2/3 hip precautions Restrictions Weight Bearing Restrictions: Yes LLE Weight Bearing: Weight bearing as tolerated    Mobility  Bed Mobility Overal bed mobility: Needs Assistance Bed Mobility: Supine to Sit     Supine to sit: Min guard     General bed mobility comments: safe technique while maintaining hip precautions.   Transfers Overall transfer level: Needs assistance Equipment used: Rolling walker (2 wheeled) Transfers: Sit to/from Stand Sit to Stand: Min guard;From elevated surface         General transfer comment: safe technique with use of RW. UPright posture noted  Ambulation/Gait Ambulation/Gait assistance: Min guard Ambulation Distance (Feet): 110 Feet Assistive device: Rolling walker (2 wheeled) Gait Pattern/deviations: Step-through pattern     General Gait Details: inital step to gait, however able to progress to reciprocal gait pattern with increased L knee flexion during swing. Upright posture.    Stairs             Wheelchair Mobility    Modified Rankin (Stroke Patients Only)        Balance Overall balance assessment: Needs assistance Sitting-balance support: No upper extremity supported;Feet supported Sitting balance-[name]: Good     Standing balance support: Bilateral upper extremity supported;During functional activity Standing balance-[name]: Good                              Cognition Arousal/Alertness: Awake/alert Behavior During Therapy: WFL for tasks assessed/performed Overall Cognitive Status: Within Functional Limits for tasks assessed                                        Exercises Other Exercises Other Exercises: supine ther-ex performed on L LE including LAQ, SAQ, glut sets, ankle pumps, and hip abd/add. ALl ther-ex performed x 12 reps with safe technique.  Other Exercises: Ambulated to bathroom with cga. Needs cues to maintain hip precautions during L turns. Safe technique noted.    General Comments        Pertinent Vitals/Pain Pain Assessment: 0-10 Pain Score: 5  Pain Location: L hip Pain Descriptors / Indicators: Operative site guarding Pain Intervention(s): Limited activity within patient's tolerance;Patient requesting pain meds-RN notified    Home Living                      Prior Function            PT Goals (current goals can now be found in the care plan section) Acute Rehab PT Goals Patient Stated Goal:  to go home at d/c PT Goal Formulation: With patient Time For Goal Achievement: 12/16/17 Potential to Achieve Goals: Good Progress towards PT goals: Progressing toward goals    Frequency    BID      PT Plan Current plan remains appropriate    Co-evaluation              AM-PAC PT "6 Clicks" Daily Activity  Outcome Measure  Difficulty turning over in bed (including adjusting bedclothes, sheets and blankets)?: Unable Difficulty moving from lying on back to sitting on the side of the bed? : Unable Difficulty sitting down on and standing up from a chair with arms  (e.g., wheelchair, bedside commode, etc,.)?: Unable Help needed moving to and from a bed to chair (including a wheelchair)?: A Little Help needed walking in hospital room?: A Little Help needed climbing 3-5 steps with a railing? : A Little 6 Click Score: 12    End of Session Equipment Utilized During Treatment: Gait belt Activity Tolerance: Patient tolerated treatment well Patient left: in chair;with call bell/phone within reach;with chair alarm set;Other (comment) Nurse Communication: Mobility status;Precautions;Other (comment);Patient requests pain meds PT Visit Diagnosis: Pain;Unsteadiness on feet (R26.81);Other abnormalities of gait and mobility (R26.89);Muscle weakness (generalized) (M62.81);Difficulty in walking, not elsewhere classified (R26.2) Pain - Right/Left: Left Pain - part of body: Hip     Time: 0254-2706 PT Time Calculation (min) (ACUTE ONLY): 23 min  Charges:  $Gait Training: 8-22 mins $Therapeutic Exercise: 8-22 mins                    G Codes:       Greggory Stallion, PT, DPT [phone]    Suhaas Agena 12/03/2017, 11:45 AM

## 2017-12-03 NOTE — NC FL2 (Signed)
[place] LEVEL OF CARE SCREENING TOOL     IDENTIFICATION  Patient Name: Leslie Parker Birthdate: 07-05-1933 Sex: female Admission Date (Current Location): 12/02/2017  [facility] and [place] Number:  Selena Lesser ([id number]) Facility and Address:  [facility], [address] [place], [place] [address]      Provider Number: [id number]  Attending Physician Name and Address:  Lovell Sheehan, MD  Relative Name and Phone Number:       Current Level of Care: Hospital Recommended Level of Care: [place] Prior Approval Number:    Date Approved/Denied:   PASRR Number: ([id number] )  Discharge Plan: SNF    Current Diagnoses: Patient Active Problem List   Diagnosis Date Noted  . 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 Closed left hip fracture ([place]) 01/25/2017  . S/P hardware removal 01/17/2017  . Asthma, chronic 02/10/2015  . COPD exacerbation ([place]) 02/10/2015  . Acute on chronic respiratory failure ([place]) 02/10/2015  . Atelectasis 02/10/2015  . Hypoxia   . Shortness of breath   . Femur fracture, left ([place]) 02/03/2015  . Hypertension 02/03/2015    Orientation RESPIRATION BLADDER Height & Weight     Self, Time, Situation, Place  Normal Continent Weight: 165 lb (74.8 kg) Height:  5\' 9"  (175.3 cm)  BEHAVIORAL SYMPTOMS/MOOD NEUROLOGICAL BOWEL NUTRITION STATUS      Continent Diet(Diet: Carb Modified. )  AMBULATORY STATUS COMMUNICATION OF NEEDS Skin   Extensive Assist Verbally Surgical wounds(Incision: Left Hip. )                       Personal Care Assistance Level of Assistance  Bathing, Feeding, Dressing Bathing Assistance: Limited assistance Feeding assistance: Independent Dressing Assistance: Limited assistance     Functional Limitations Info  Sight, Hearing, Speech Sight Info: Adequate Hearing Info: Adequate Speech Info: Adequate    SPECIAL CARE FACTORS FREQUENCY  PT (By licensed PT),  OT (By licensed OT)     PT Frequency: (5) OT Frequency: (5)            Contractures      Additional Factors Info  Code Status, Allergies Code Status Info: (Full Code. ) Allergies Info: (No Known Allergies. )           Current Medications (12/03/2017):  This is the current hospital active medication list Current Facility-Administered Medications  Medication Dose Route Frequency Provider Last Rate Last Dose  . acetaminophen (TYLENOL) tablet 325-650 mg  325-650 mg Oral Q6H PRN Lovell Sheehan, MD      . acetaminophen (TYLENOL) tablet 500 mg  500 mg Oral Q6H Lovell Sheehan, MD   500 mg at 12/02/17 2046  . amLODipine (NORVASC) tablet 10 mg  10 mg Oral Daily Lovell Sheehan, MD   10 mg at 12/02/17 1811  . aspirin chewable tablet 81 mg  81 mg Oral BID Lovell Sheehan, MD   81 mg at 12/02/17 2046  . bisacodyl (DULCOLAX) suppository 10 mg  10 mg Rectal Daily PRN Lovell Sheehan, MD      . docusate sodium (COLACE) capsule 100 mg  100 mg Oral BID Lovell Sheehan, MD   100 mg at 12/02/17 2046  . gabapentin (NEURONTIN) capsule 300 mg  300 mg Oral TID Lovell Sheehan, MD   300 mg at 12/02/17 2047  . HYDROcodone-acetaminophen (NORCO) 7.5-325 MG per tablet 1-2 tablet  1-2 tablet Oral Q4H PRN Lovell Sheehan, MD      .  HYDROcodone-acetaminophen (NORCO/VICODIN) 5-325 MG per tablet 1-2 tablet  1-2 tablet Oral Q4H PRN Lovell Sheehan, MD   1 tablet at 12/02/17 1558  . insulin aspart (novoLOG) injection 0-15 Units  0-15 Units Subcutaneous TID WC Lovell Sheehan, MD      . ketorolac (TORADOL) 15 MG/ML injection 7.5 mg  7.5 mg Intravenous Q6H Lovell Sheehan, MD   7.5 mg at 12/02/17 1806  . lactated ringers infusion   Intravenous Continuous Lovell Sheehan, MD 75 mL/hr at 12/02/17 1403    . magnesium citrate solution 1 Bottle  1 Bottle Oral Once PRN Lovell Sheehan, MD      . magnesium oxide (MAG-OX) tablet 400 mg  400 mg Oral Daily Lovell Sheehan, MD      . menthol-cetylpyridinium (CEPACOL) lozenge  3 mg  1 lozenge Oral PRN Lovell Sheehan, MD       Or  . phenol (CHLORASEPTIC) mouth spray 1 spray  1 spray Mouth/Throat PRN Lovell Sheehan, MD      . metFORMIN (GLUCOPHAGE) tablet 500 mg  500 mg Oral BID WC Lovell Sheehan, MD   500 mg at 12/02/17 1805  . metoCLOPramide (REGLAN) tablet 5-10 mg  5-10 mg Oral Q8H PRN Lovell Sheehan, MD       Or  . metoCLOPramide (REGLAN) injection 5-10 mg  5-10 mg Intravenous Q8H PRN Lovell Sheehan, MD      . mometasone-formoterol [facility]) 200-5 MCG/ACT inhaler 2 puff  2 puff Inhalation BID Lovell Sheehan, MD   2 puff at 12/02/17 2047  . morphine 2 MG/ML injection 0.5-1 mg  0.5-1 mg Intravenous Q2H PRN Lovell Sheehan, MD      . multivitamin with minerals tablet 1 tablet  1 tablet Oral Daily Lovell Sheehan, MD      . ondansetron [facility]) tablet 4 mg  4 mg Oral Q6H PRN Lovell Sheehan, MD       Or  . ondansetron [facility]) injection 4 mg  4 mg Intravenous Q6H PRN Lovell Sheehan, MD      . traMADol [facility]) tablet 50 mg  50 mg Oral Q6H Lovell Sheehan, MD   50 mg at 12/02/17 1806  . vitamin B-12 (CYANOCOBALAMIN) tablet 2,500 mcg  2,500 mcg Oral Daily Lovell Sheehan, MD         Discharge Medications: Please see discharge summary for a list of discharge medications.  Relevant Imaging Results:  Relevant Lab Results:   Additional Information (SSN: [id number])  Leanard Dimaio, Veronia Beets, LCSW

## 2017-12-03 NOTE — Evaluation (Signed)
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  Pt's PMH includes cancer.    Clinical Impression   Pt seen for OT evaluation this date, POD#1 from above surgery. Pt was independent in all ADLs prior to surgery, however using a SPC over the last month due to L hip pain. Pt is eager to return to PLOF with less pain and improved safety and independence. Pt currently requires mod assist for LB dressing while in seated position due to pain and limited AROM of L hip. Pt able to recall 2/3 posterior total hip precautions at start of session and unable to verbalize how to implement during ADL and mobility. About 10 minutes apart from evaluation spent instructing pt in posterior total hip precautions and how to implement, self care skills, falls prevention strategies, home/routines modifications, DME/AE for LB bathing and dressing tasks, and compression stocking mgt strategies. Dtr in room near end of session and verbalized understanding of education provided. Pt would benefit from additional instruction in self care skills and techniques to help maintain precautions with or without assistive devices to support recall and carryover prior to discharge. Do not anticipate skilled OT needs upon discharge.      Follow Up Recommendations  No OT follow up    Equipment Recommendations  None recommended by OT    Recommendations for Other Services       Precautions / Restrictions Precautions Precautions: Fall;Posterior Hip Precaution Booklet Issued: No Precaution Comments: additional education provided to maximize recall and carryover Restrictions Weight Bearing Restrictions: Yes LLE Weight Bearing: Weight bearing as tolerated      Mobility Bed Mobility               General bed mobility comments: pt declined  2:2 just back to bed and nausea requiring medicine recently taken  Transfers                 General transfer comment: pt declined 2:2 just back to bed and nausea requiring medicine recently taken    Balance                                           ADL either performed or assessed with clinical judgement   ADL Overall ADL's : Needs assistance/impaired Eating/Feeding: Sitting;Independent   Grooming: Sitting;Independent   Upper Body Bathing: Sitting;Supervision/ safety;Set up   Lower Body Bathing: Sit to/from stand;Moderate assistance   Upper Body Dressing : Sitting;Supervision/safety;Set up   Lower Body Dressing: Sit to/from stand;Moderate assistance Lower Body Dressing Details (indicate cue type and reason): educated in AE for LB dressing, compression stocking mgt                     Vision Baseline Vision/History: Wears glasses Wears Glasses: Reading only Patient Visual Report: No change from baseline       Perception     Praxis      Pertinent Vitals/Pain Pain Assessment: 0-10 Pain Score: 0-No pain Pain Location: 0 at rest, up to 7/10 with mobility Pain Descriptors / Indicators: Operative site guarding Pain Intervention(s): Limited activity within patient's tolerance;Monitored during session     Hand Dominance Right   Extremity/Trunk Assessment Upper Extremity Assessment Upper Extremity Assessment: Overall  WFL for tasks assessed   Lower Extremity Assessment Lower Extremity Assessment: Defer to PT evaluation;LLE deficits/detail   Cervical / Trunk Assessment Cervical / Trunk Assessment: Normal   Communication Communication Communication: No difficulties   Cognition Arousal/Alertness: Awake/alert Behavior During Therapy: WFL for tasks assessed/performed Overall Cognitive Status: Within Functional Limits for tasks assessed                                     General Comments       Exercises Other  Exercises Other Exercises: Pt/daughter educated in falls prevention, home/routines modifications, compression stocking mgt   Shoulder Instructions      Home Living Family/patient expects to be discharged to:: Private residence Living Arrangements: Alone Available Help at Discharge: Family;Available 24 hours/day(daughter 24/7 until 5/17) Type of Home: Apartment Home Access: Level entry     Home Layout: One level     Bathroom Shower/Tub: Tub/shower unit;Curtain   Bathroom Toilet: Handicapped height(comfort height w/ riser and bilat rails) Bathroom Accessibility: Yes   Home Equipment: Walker - 4 wheels;Cane - single point;Shower seat;Grab bars - toilet;Grab bars - tub/shower;Walker - 2 wheels;Adaptive equipment;Hand held shower head Adaptive Equipment: Reacher;Sock aid;Long-handled shoe horn;Long-handled sponge Additional Comments: Daughter will be staying with pt 24/7 until 5/17 and then returning to her home in [place].  Granddaughter lives nearby and will be able to check on the pt.  Sister lives nearby.        Prior Functioning/Environment Level of Independence: Independent with assistive device(s)        Comments: Pt has ambulated with a SPC for the past month due to pain.  No falls in the past 6 months.  Independent with ADLs, IADLs.         OT Problem List: Decreased strength;Decreased knowledge of use of DME or AE;Decreased range of motion;Decreased knowledge of precautions;Decreased activity tolerance;Pain;Impaired balance (sitting and/or standing)      OT Treatment/Interventions: Self-care/ADL training;Therapeutic exercise;Balance training;Therapeutic activities;DME and/or AE instruction;Patient/family education    OT Goals(Current goals can be found in the care plan section) Acute Rehab OT Goals Patient Stated Goal: go home and return to PLOF with less pain OT Goal Formulation: With patient/family Time For Goal Achievement: 12/17/17 Potential to Achieve Goals:  Good ADL Goals Pt Will Perform Lower Body Dressing: with supervision;sit to/from stand;with adaptive equipment Pt Will Transfer to Toilet: with supervision;ambulating(BSC frame over commode, RW for amb)  OT Frequency: Min 1X/week   Barriers to D/C:            Co-evaluation              AM-PAC PT "6 Clicks" Daily Activity     Outcome Measure Help from another person eating meals?: None Help from another person taking care of personal grooming?: None Help from another person toileting, which includes using toliet, bedpan, or urinal?: A Little Help from another person bathing (including washing, rinsing, drying)?: A Lot Help from another person to put on and taking off regular upper body clothing?: None Help from another person to put on and taking off regular lower body clothing?: A Lot 6 Click Score: 19   End of Session    Activity Tolerance: Patient tolerated treatment well;Patient limited by fatigue(limited by recent nausea) Patient left: in bed;with call bell/phone within reach;with bed alarm set;with family/visitor present  OT Visit Diagnosis: Other abnormalities of gait and mobility (R26.89);Muscle weakness (generalized) (M62.81);Pain Pain - Right/Left:  Left Pain - part of body: Hip                Time: 1454-1511 OT Time Calculation (min): 17 min Charges:  OT General Charges $OT Visit: 1 Visit OT Evaluation $OT Eval Moderate Complexity: 1 Mod OT Treatments $Self Care/Home Management : 8-22 mins  Jeni Salles, MPH, MS, OTR/L ascom [phone] 12/03/17, 4:07 PM

## 2017-12-03 NOTE — Progress Notes (Signed)
Clinical Social Worker (CSW) received SNF consult. PT is recommending home health. RN case manager aware of above. Please reconsult if future social work needs arise. CSW signing off.   Damisha Wolff, LCSW [phone] 

## 2017-12-03 NOTE — Progress Notes (Signed)
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  Pt's PMH includes cancer.     PT Comments    Pt is making good progress towards goals with ability to participate in therapy despite severe nausea symptoms. RN notified and brought meds during treatment. Pt also ambulated to bathroom this session with less cues for hip precautions. Good endurance with there-ex. Will continue to progress.   Follow Up Recommendations  Home health PT     Equipment Recommendations  None recommended by PT    Recommendations for Other Services       Precautions / Restrictions Precautions Precautions: Fall;Posterior Hip Precaution Booklet Issued: Yes (comment) Precaution Comments: additional education provided to maximize recall and carryover Restrictions Weight Bearing Restrictions: Yes LLE Weight Bearing: Weight bearing as tolerated    Mobility  Bed Mobility Overal bed mobility: Needs Assistance Bed Mobility: Supine to Sit     Supine to sit: Supervision     General bed mobility comments: safe technique performed   Transfers Overall transfer level: Needs assistance Equipment used: Rolling walker (2 wheeled) Transfers: Sit to/from Stand Sit to Stand: Supervision         General transfer comment: safe technique with less physical assist required. Does need cues for hip precautions  Ambulation/Gait Ambulation/Gait assistance: Min guard Ambulation Distance (Feet): 110 Feet Assistive device: Rolling walker (2 wheeled) Gait Pattern/deviations: Step-through pattern     General Gait Details: reciprocal gait pattern noted using RW. Cues for hip precautions given. Distance limited secondary to nausea symptoms   Stairs             Wheelchair Mobility    Modified Rankin (Stroke Patients Only)        Balance                                            Cognition Arousal/Alertness: Awake/alert Behavior During Therapy: WFL for tasks assessed/performed Overall Cognitive Status: Within Functional Limits for tasks assessed                                        Exercises Other Exercises Other Exercises: Pt/daughter educated in falls prevention, home/routines modifications, compression stocking mgt Other Exercises: Supine ther-ex performed x 12 reps including ankle pumps, quad sets, glut sets, SAQ, and hip abd/add. All ther-ex performed with cga    General Comments        Pertinent Vitals/Pain Pain Assessment: No/denies pain Pain Score: 0-No pain Pain Location: 0 at rest, up to 7/10 with mobility Pain Descriptors / Indicators: Operative site guarding Pain Intervention(s): Limited activity within patient's tolerance;Monitored during session    [place] expects to be discharged to:: Private residence Living Arrangements: Alone Available Help at Discharge: Family;Available 24 hours/day(daughter 24/7 until 5/17) Type of Home: Apartment Home Access: Level entry   Home Layout: One level Home Equipment: Walker - 4 wheels;Cane - single point;Shower seat;Grab bars - toilet;Grab bars - tub/shower;Walker - 2 wheels;Adaptive equipment;Hand held shower head Additional Comments: Daughter will be staying with pt 24/7 until 5/17 and then returning to her home in [place].  Granddaughter lives nearby and will  be able to check on the pt.  Sister lives nearby.      Prior Function Level of Independence: Independent with assistive device(s)      Comments: Pt has ambulated with a SPC for the past month due to pain.  No falls in the past 6 months.  Independent with ADLs, IADLs.    PT Goals (current goals can now be found in the care plan section) Acute Rehab PT Goals Patient Stated Goal: go home and return to PLOF with less pain PT Goal  Formulation: With patient Time For Goal Achievement: 12/16/17 Potential to Achieve Goals: Good Progress towards PT goals: Progressing toward goals    Frequency    BID      PT Plan Current plan remains appropriate    Co-evaluation              AM-PAC PT "6 Clicks" Daily Activity  Outcome Measure  Difficulty turning over in bed (including adjusting bedclothes, sheets and blankets)?: Unable Difficulty moving from lying on back to sitting on the side of the bed? : Unable Difficulty sitting down on and standing up from a chair with arms (e.g., wheelchair, bedside commode, etc,.)?: Unable Help needed moving to and from a bed to chair (including a wheelchair)?: A Little Help needed walking in hospital room?: A Little Help needed climbing 3-5 steps with a railing? : A Little 6 Click Score: 12    End of Session Equipment Utilized During Treatment: Gait belt Activity Tolerance: Patient tolerated treatment well Patient left: in bed;with bed alarm set;with SCD's reapplied Nurse Communication: Mobility status;Precautions;Other (comment);Patient requests pain meds PT Visit Diagnosis: Pain;Unsteadiness on feet (R26.81);Other abnormalities of gait and mobility (R26.89);Muscle weakness (generalized) (M62.81);Difficulty in walking, not elsewhere classified (R26.2) Pain - Right/Left: Left Pain - part of body: Hip     Time: 1350-1413 PT Time Calculation (min) (ACUTE ONLY): 23 min  Charges:  $Gait Training: 8-22 mins $Therapeutic Exercise: 8-22 mins                    G Codes:       Greggory Stallion, PT, DPT [phone]    Daine Croker 12/03/2017, 4:45 PM

## 2017-12-04 LAB — CBC
HEMATOCRIT: 28.2 % — AB (ref 35.0–47.0)
HEMOGLOBIN: 9.8 g/dL — AB (ref 12.0–16.0)
MCH: 30.6 pg (ref 26.0–34.0)
MCHC: 34.7 g/dL (ref 32.0–36.0)
MCV: 88.1 fL (ref 80.0–100.0)
Platelets: 274 10*3/uL (ref 150–440)
RBC: 3.2 MIL/uL — AB (ref 3.80–5.20)
RDW: 13.6 % (ref 11.5–14.5)
WBC: 7.8 10*3/uL (ref 3.6–11.0)

## 2017-12-04 LAB — GLUCOSE, CAPILLARY: GLUCOSE-CAPILLARY: 121 mg/dL — AB (ref 65–99)

## 2017-12-04 MED ORDER — DOCUSATE SODIUM 100 MG PO CAPS: 100.0000 mg | ORAL_CAPSULE | Freq: Two times a day (BID) | ORAL | 0 refills | Status: DC

## 2017-12-04 MED ORDER — ASPIRIN 81 MG PO CHEW: 81.0000 mg | CHEWABLE_TABLET | Freq: Two times a day (BID) | ORAL | 0 refills | Status: DC

## 2017-12-04 MED ORDER — HYDROCODONE-ACETAMINOPHEN 5-325 MG PO TABS: 1.0000 | ORAL_TABLET | ORAL | 0 refills | Status: DC | PRN

## 2017-12-04 NOTE — Progress Notes (Signed)
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  Pt's PMH includes cancer.     PT Comments    Pt is making good progress towards goals with ability to safely navigate RN station with RW and less cues for hip precautions. Gave written HEP for review. Updated CM for OP PT needs. Good endurance with there-ex. Did report nausea this session and requested to leave chair and return back to bed. Plans for dc this date, will dc at this time.   Follow Up Recommendations  Outpatient PT     Equipment Recommendations  None recommended by PT    Recommendations for Other Services       Precautions / Restrictions Precautions Precautions: Fall;Posterior Hip Precaution Booklet Issued: Yes (comment) Restrictions Weight Bearing Restrictions: Yes LLE Weight Bearing: Weight bearing as tolerated    Mobility  Bed Mobility Overal bed mobility: Needs Assistance Bed Mobility: Supine to Sit     Supine to sit: Supervision     General bed mobility comments: safe technique performed   Transfers Overall transfer level: Needs assistance Equipment used: Rolling walker (2 wheeled) Transfers: Sit to/from Stand Sit to Stand: Min guard         General transfer comment: able to stand from lower position this date, however still needs cues for hip precautions. Once standing, able to stand with upright posture  Ambulation/Gait Ambulation/Gait assistance: Min guard Ambulation Distance (Feet): 200 Feet Assistive device: Rolling walker (2 wheeled) Gait Pattern/deviations: Step-through pattern     General Gait Details: reciprocal gait pattern with safe technique and no cues for hip precautions. Upright posture noted   Stairs             Wheelchair Mobility    Modified Rankin (Stroke Patients Only)        Balance                                            Cognition Arousal/Alertness: Awake/alert Behavior During Therapy: WFL for tasks assessed/performed Overall Cognitive Status: Within Functional Limits for tasks assessed                                        Exercises Other Exercises Other Exercises: ambulated to bathroom with supervision. Set up required for hygiene. Safe technique noted' Other Exercises: supine ther-ex performed x 15 reps on L LE including ankle pumps, quad sets, glut sets, SAQ, LAQ, and hip abd/add. Written HEP given and reviewed with safe technique    General Comments        Pertinent Vitals/Pain Pain Assessment: No/denies pain    Home Living                      Prior Function            PT Goals (current goals can now be found in the care plan section) Acute Rehab PT Goals Patient Stated Goal: go home and return to PLOF with less pain PT Goal Formulation: With patient Time For Goal Achievement: 12/16/17 Potential to Achieve Goals: Good Progress towards PT goals: Progressing toward goals  Frequency    BID      PT Plan Discharge plan needs to be updated    Co-evaluation              AM-PAC PT "6 Clicks" Daily Activity  Outcome Measure  Difficulty turning over in bed (including adjusting bedclothes, sheets and blankets)?: None Difficulty moving from lying on back to sitting on the side of the bed? : None Difficulty sitting down on and standing up from a chair with arms (e.g., wheelchair, bedside commode, etc,.)?: Unable Help needed moving to and from a bed to chair (including a wheelchair)?: A Little Help needed walking in hospital room?: A Little Help needed climbing 3-5 steps with a railing? : A Little 6 Click Score: 18    End of Session Equipment Utilized During Treatment: Gait belt Activity Tolerance: Patient tolerated treatment well Patient left: in bed;with bed alarm set Nurse  Communication: Mobility status PT Visit Diagnosis: Pain;Unsteadiness on feet (R26.81);Other abnormalities of gait and mobility (R26.89);Muscle weakness (generalized) (M62.81);Difficulty in walking, not elsewhere classified (R26.2) Pain - Right/Left: Left Pain - part of body: Hip     Time: 2449-7530 PT Time Calculation (min) (ACUTE ONLY): 23 min  Charges:  $Gait Training: 8-22 mins $Therapeutic Exercise: 8-22 mins                    G Codes:       Greggory Stallion, PT, DPT [phone]    Leslie Parker 12/04/2017, 11:17 AM

## 2017-12-04 NOTE — Discharge Instructions (Signed)
No tight elastic waist bands over the bandage. Not wearing underwear is preferred, or pull waist band above the bandage.  May shower with bandage in place.  If bandage becomes saturated, OK to removal and place band-aid.  You may be up walking around as tolerated but should take periodic breaks to elevate your legs.    Pain medication can cause constipation.  You should increase your fluid intake, increase your intake of high fiber foods and/or take Metamucil as needed for constipation.  You may shower.  You do NOT need to cover the dressing or incision site with plastic wrap.  The dressing or incision can get wet, but do not submerge under water.  After your staples have been removed, you should wait 48 hours before submerging incision under water.  Continue your physical therapy exercises at least twice daily.  It is a good idea to use an ice pack for 30 minutes after doing your exercises to reduce swelling.  Do not be surprised if you have increased pain at night.  This usually means you have been a little too active during the day and need to reduce your activities.  If you develop lower extremity swelling that does not improve after a night of elevation, please call the office.  This could be an early sign of a blood clot.  Call with questions, fever>101.5 degrees, shortness of breath or drainage from the wound  [phone]  

## 2017-12-04 NOTE — Discharge Summary (Signed)
Physician Discharge Summary  Patient ID: SEQUOYA HOGSETT MRN: [id number] DOB/AGE: 1932-09-07 84 y.o.  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 Asthma   . Cancer (HCC)    Basal Cell  . Diabetes mellitus without complication ([place])   . Heart murmur   . Hypertension   . Impetigo   . Osteoarthritis   . Osteopenia   . Vomiting    can not due to surgery  . Wears dentures    full upper and lower    Surgeries: Procedure(s): TOTAL HIP REVISION on 12/02/2017   Consultants (if any):   Discharged Condition: Improved  Hospital Course: ZAMORAH AILES is an 84 y.o. female who was admitted 12/02/2017 with a diagnosis of  Z96.642 Presence of left artificial hip joint  M24.852 Oth specific joint derangements of left hip, NEC <principal problem not specified> and went to the operating room on 12/02/2017 and underwent the above named procedures.    She was given perioperative antibiotics:  Anti-infectives (From admission, onward)   Start     Dose/Rate Route Frequency Ordered Stop   12/02/17 1400  ceFAZolin (ANCEF) IVPB 1 g/50 mL premix     1 g 100 mL/hr over 30 Minutes Intravenous Every 6 hours 12/02/17 1152 12/02/17 2117   12/02/17 0854  50,000 units bacitracin in 0.9% normal saline 250 mL irrigation  Status:  Discontinued       As needed 12/02/17 0855 12/02/17 1008   12/02/17 0600  ceFAZolin (ANCEF) IVPB 2g/100 mL premix     2 g 200 mL/hr over 30 Minutes Intravenous On call to O.R. 12/01/17 2304 12/02/17 0802   12/02/17 0552  ceFAZolin (ANCEF) 2-4 GM/100ML-% IVPB    Note to Pharmacy:   Ronnell [facility]   : cabinet override      12/02/17 0552 12/02/17 0757    .  She was given sequential compression devices, early ambulation, and ECASA for DVT prophylaxis.  She benefited maximally from the hospital stay and there were no complications.    Recent vital signs:  Vitals:   12/04/17 0039 12/04/17 0814  BP: (!) 147/65 (!) 158/90  Pulse: 92 90  Resp: 19   Temp: 98.4 F (36.9 C) 98.6 F (37 C)  SpO2: 94% (!) 77%    Recent laboratory studies:  Lab Results  Component Value Date   HGB 9.8 (L) 12/04/2017   HGB 10.5 (L) 12/03/2017   HGB 11.9 (L) 11/25/2017   Lab Results  Component Value Date   WBC 7.8 12/04/2017   PLT 274 12/04/2017   Lab Results  Component Value Date   INR 1.09 11/25/2017   Lab Results  Component Value Date   NA 132 (L) 12/03/2017   K 4.9 12/03/2017   CL 105 12/03/2017   CO2 23 12/03/2017   BUN 21 (H) 12/03/2017   CREATININE 1.15 (H) 12/03/2017   GLUCOSE 158 (H) 12/03/2017    Discharge Medications:   Allergies as of 12/04/2017   No Known Allergies     Medication List    STOP taking these medications   traMADol 50 MG tablet Commonly known as:  Veatrice Bourbon  TAKE these medications   ADVAIR DISKUS 500-50 MCG/DOSE Aepb Generic drug:  Fluticasone-Salmeterol Inhale 1 puff into the lungs daily.   aspirin 81 MG chewable tablet Chew 1 tablet (81 mg total) by mouth 2 (two) times daily.   B-12 2500 MCG Tabs Take 2,500 mcg by mouth daily.   docusate sodium 100 MG capsule Commonly known as:  COLACE Take 1 capsule (100 mg total) by mouth 2 (two) times daily.   HYDROcodone-acetaminophen 5-325 MG tablet Commonly known as:  NORCO/VICODIN Take 1-2 tablets by mouth every 4 (four) hours as needed for moderate pain (pain score 4-6).   Magnesium Oxide 500 MG Caps Take 500 mg by mouth daily.   metFORMIN 500 MG tablet Commonly known as:  GLUCOPHAGE Take 500 mg by mouth 2 (two) times daily.   multivitamin with minerals Tabs tablet Take 1  tablet by mouth daily. One-A-Day Women's Vitamin   NORVASC 10 MG tablet Generic drug:  amLODipine Take 10 mg by mouth daily.   ondansetron 4 MG disintegrating tablet Commonly known as:  ZOFRAN-ODT Take 4 mg by mouth every 8 (eight) hours as needed for nausea.   traZODone 100 MG tablet Commonly known as:  DESYREL Take 100 mg by mouth at bedtime.       Diagnostic Studies: Dg Pelvis Portable  Result Date: 12/02/2017 CLINICAL DATA:  Status post revision of left hip replacement. EXAM: PORTABLE PELVIS 1-2 VIEWS COMPARISON:  AP pelvis of January 26, 2017 FINDINGS: The patient has undergone total hip joint prosthesis placement after removal of the previous hemiarthroplasty. Radiographic positioning of the prosthetic components is good. The interface with the native bone appears normal. An ununited greater trochanteric fracture is chronic. IMPRESSION: No immediate postprocedure complication following left total hip joint prosthesis placement. Electronically Signed   By: David  Martinique M.D.   On: 12/02/2017 10:42    Disposition: Discharge disposition: 01-Home or Self Care            Signed: Lovell Sheehan ,MD 12/04/2017, 12:18 PM

## 2017-12-04 NOTE — Care Management (Signed)
Spoke with patient regarding discharge planning. Patient lives alone but her daughter is in from [place] and will be caring for her. She has a walker. She has an outaptient PT appointment set up at Dr. Harlow Mares office for May 30. She will discharge home on ASA. Patient has no other concerns. She is very complimentary of the staff and her care while staying at [facility]. Case closed.

## 2018-04-04 IMAGING — CR DG HIP (WITH OR WITHOUT PELVIS) 2-3V*L*
3 series · 4 of 4 positions shown · non-contrast
Comparison: 02/04/2015.

CLINICAL DATA: Post scratched it recent screw removal from left
hip.

EXAM:
DG HIP (WITH OR WITHOUT PELVIS) 2-3V LEFT

[pelvis ap]
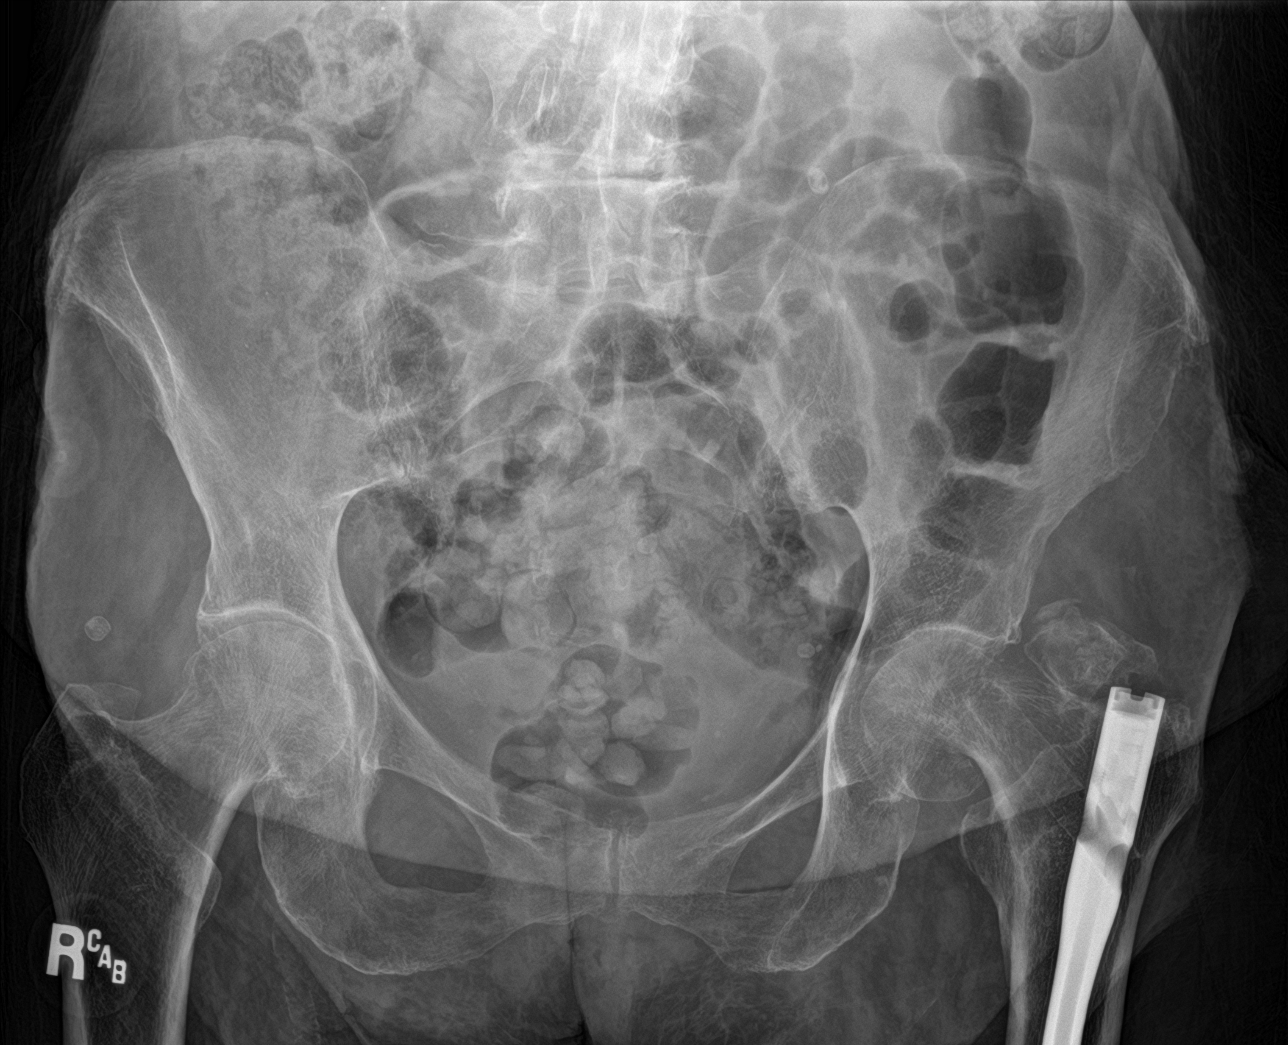

[Series 2: hip ap · 0.14mm/px · 2 of 2 slices shown]
[im 1/2]
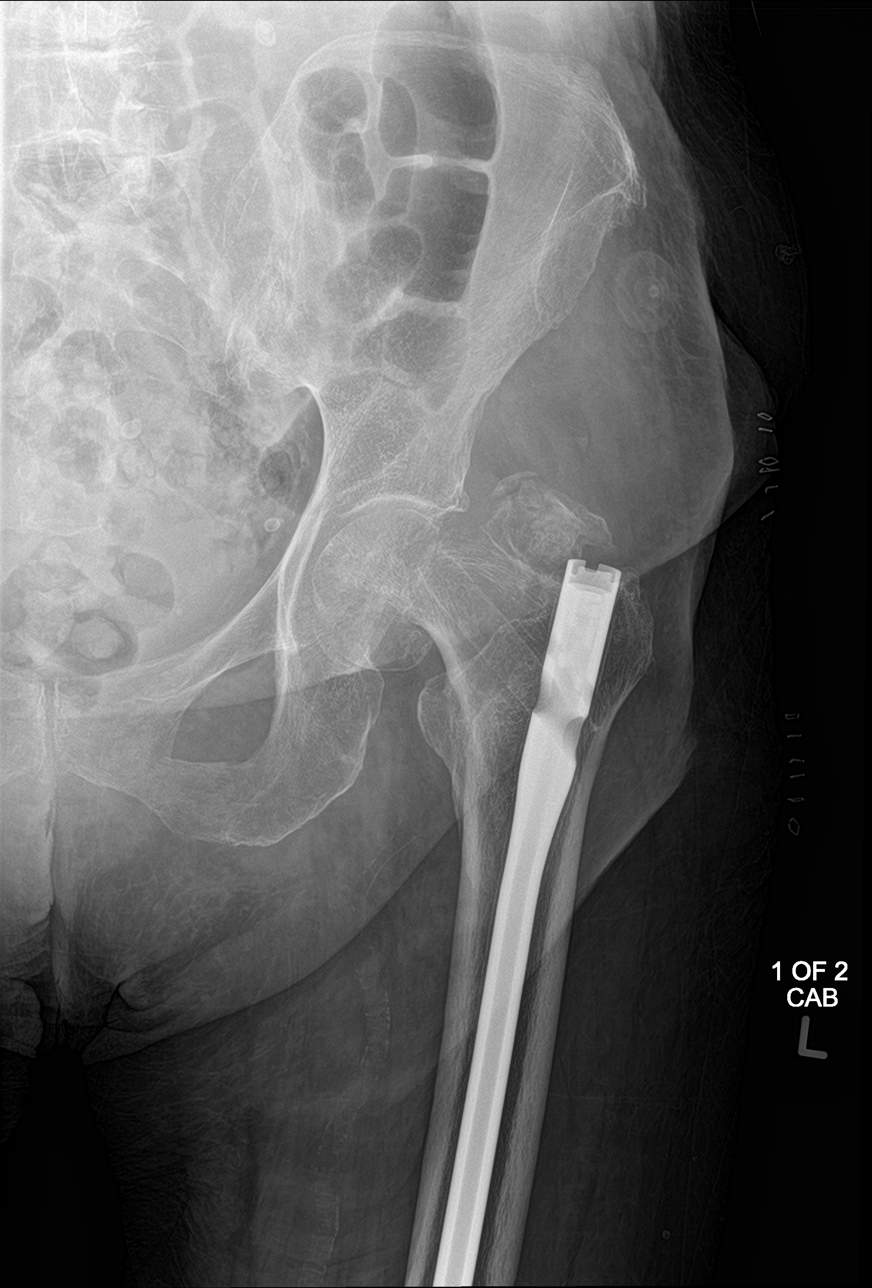
[im 2/2]
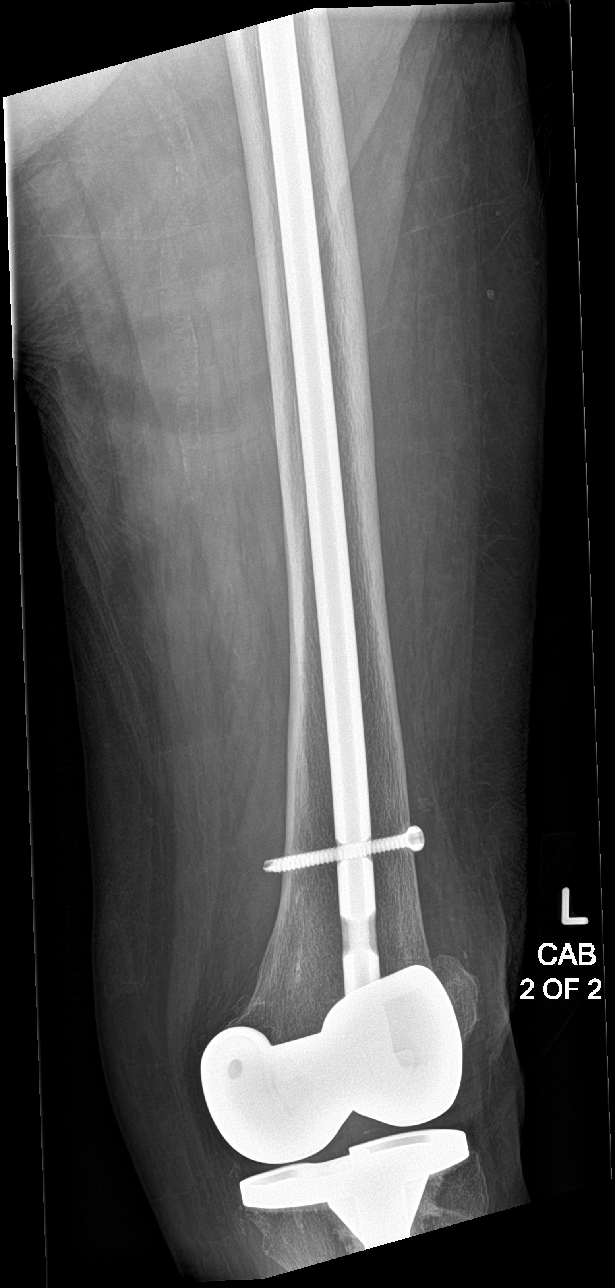

[hip lat]
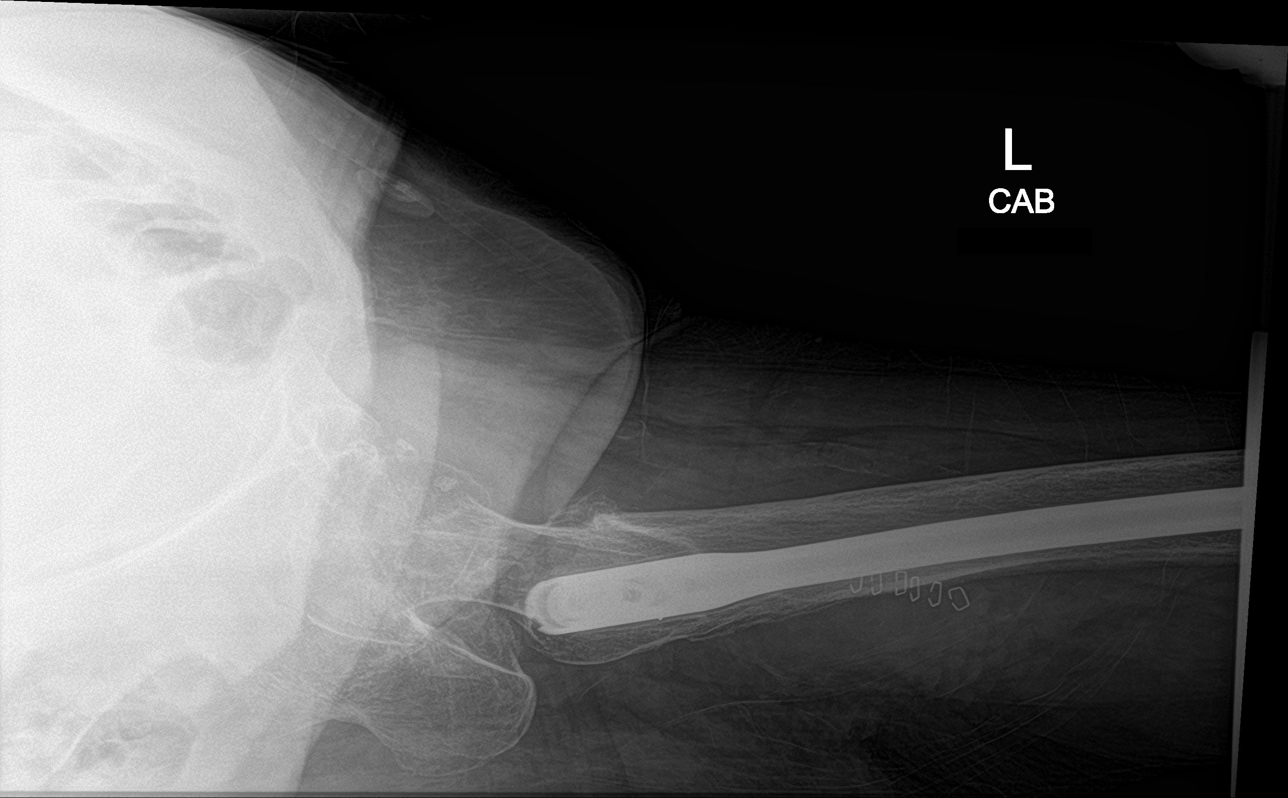

[4 of 4 positions shown; findings below may reference images not displayed]

FINDINGS: New left femoral neck fracture. Left hip surgical screw again is
been removed. Intramedullary rod and distal left femoral screw is in
place. Left knee replacement again noted. No acute bony abnormality
identified. Ossification noted adjacent to the left hip is most
likely secondary to myositis ossificans cm.
IMPRESSION: 1. New left femoral neck fracture.

2. Postsurgical changes left hip with prior removal of left hip
screw. intramedullary rod and distal femoral screw in place.

## 2018-04-05 IMAGING — DX DG PORTABLE PELVIS
1 series · 1 of 1 positions shown · non-contrast
Comparison: CT pelvis 01/02/2017.

CLINICAL DATA: Patient status post left hip arthroplasty.

EXAM:
PORTABLE PELVIS 1-2 VIEWS

[pelvis ap]
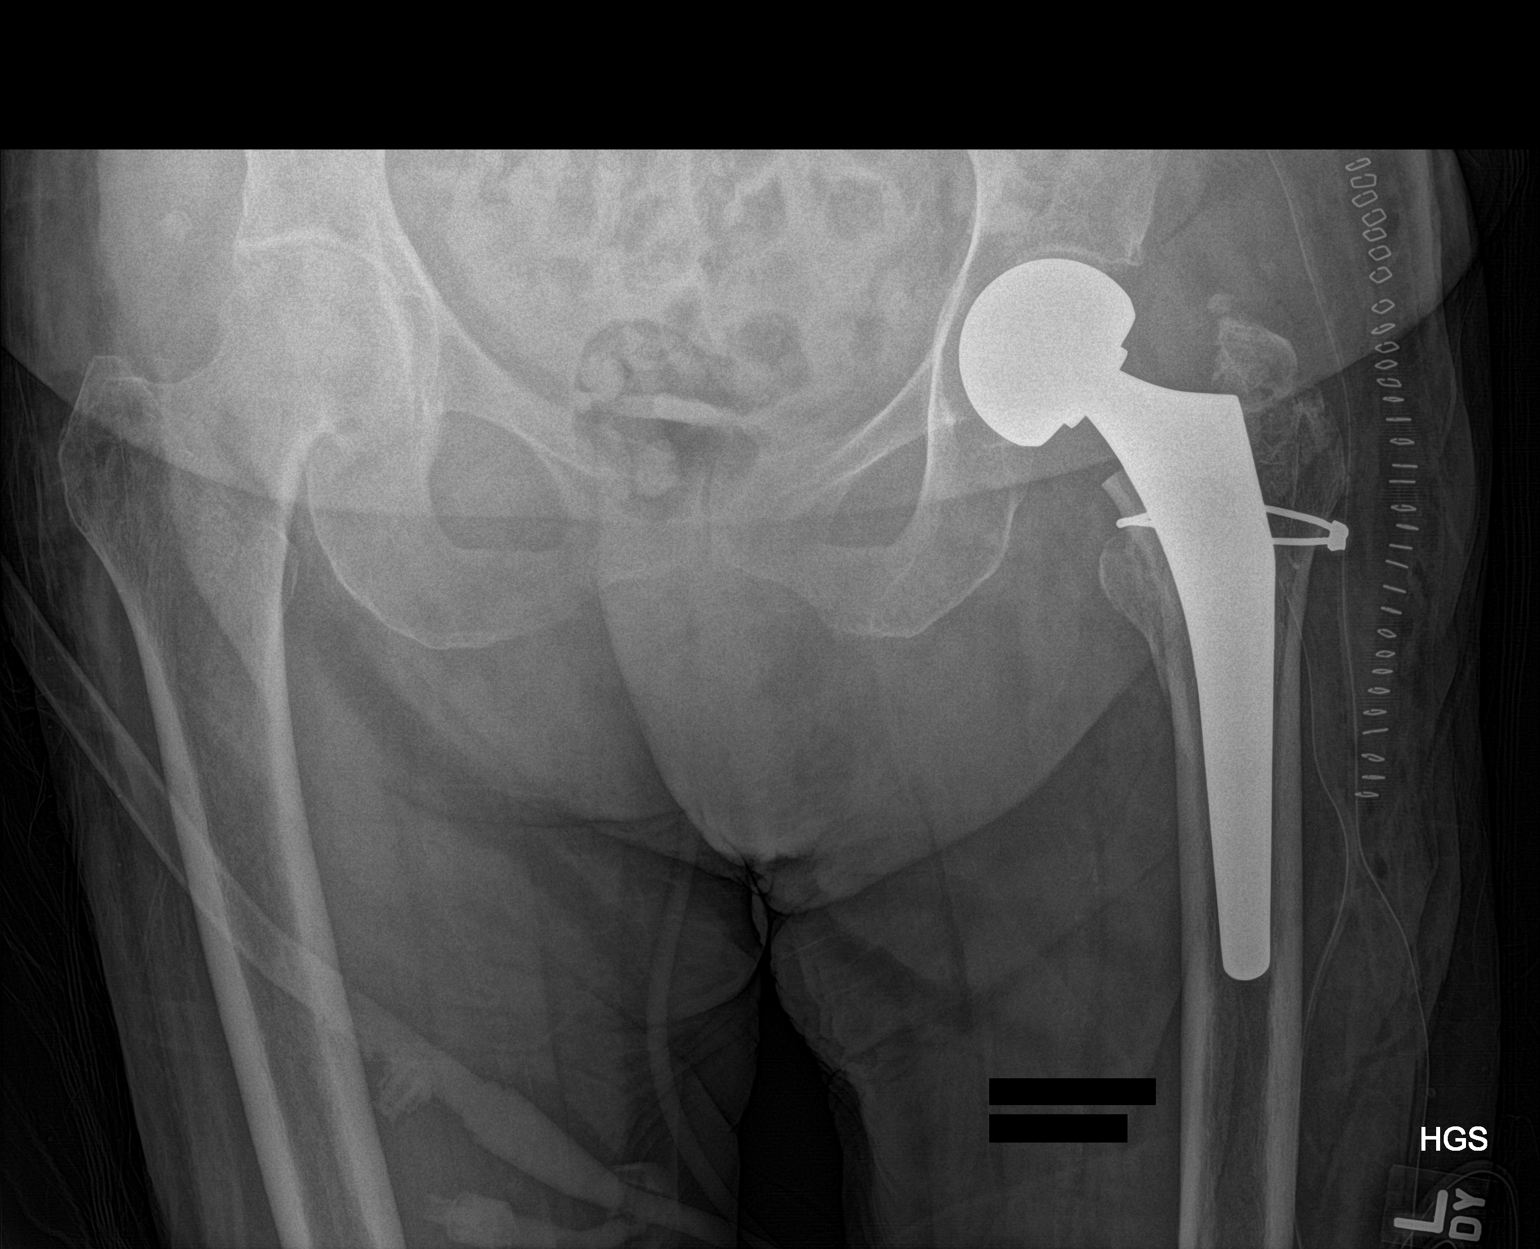

[1 of 1 positions shown; findings below may reference images not displayed]

FINDINGS: Interval left hip arthroplasty. Hardware appears intact.
Postsurgical changes within the overlying soft tissues.
IMPRESSION: Patient status post interval left hip arthroplasty.

## 2019-12-17 IMAGING — CT NM BONE 3 PHASE
4 series · 19 of 19 positions shown · non-contrast
Comparison: Radiograph 01/26/2017

CLINICAL DATA: Unipolar LEFT hip arthroplasty..

EXAM:
NUCLEAR MEDICINE 3-PHASE BONE SCAN
TECHNIQUE: Radionuclide angiographic images, immediate static blood pool
images, and 3-hour delayed static images were obtained of the hips
after intravenous injection of radiopharmaceutical.
RADIOPHARMACEUTICALS:  23.4 mCi Sc-RRm MDP IV

[Series 1000: bone (recon - ac ) · 4.8mm · 4.80mm/px · 6 of 78 frames shown]
[frame 7/78]
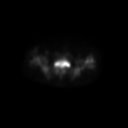
[frame 20/78]
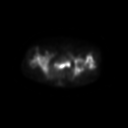
[frame 33/78]
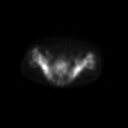
[frame 46/78]
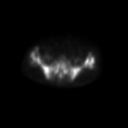
[frame 59/78]
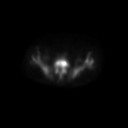
[frame 72/78]
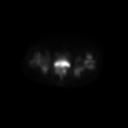

[Series 1001: axial · 9 of 78 slices shown]
[im 1/78]
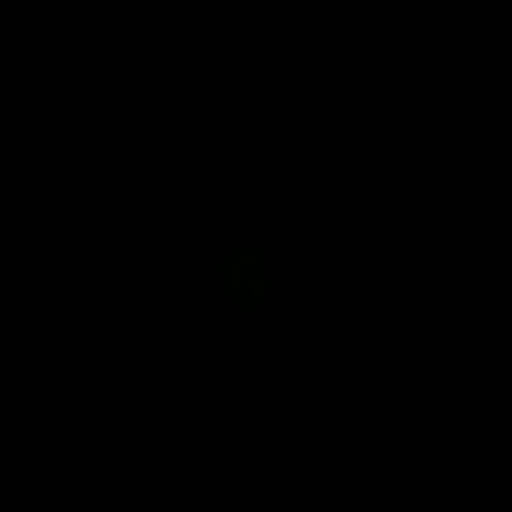
[im 10/78]
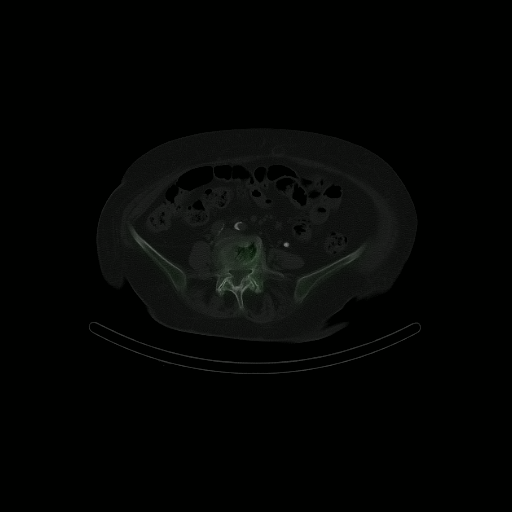
[im 20/78]
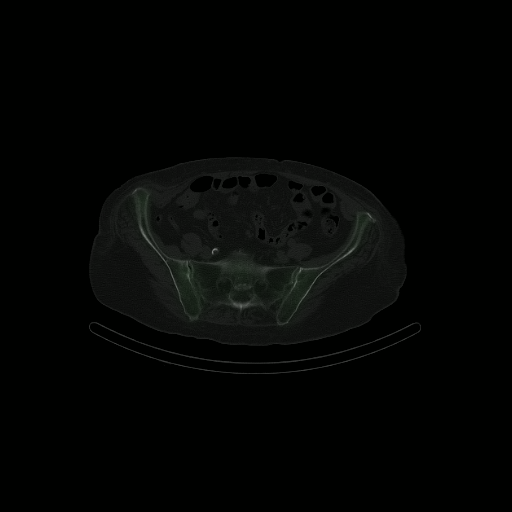
[im 29/78]
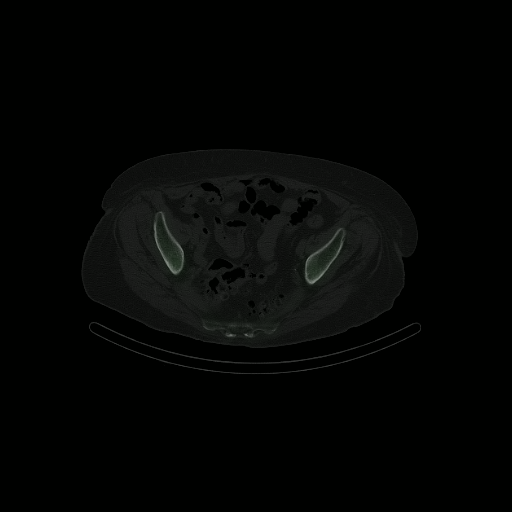
[im 39/78]
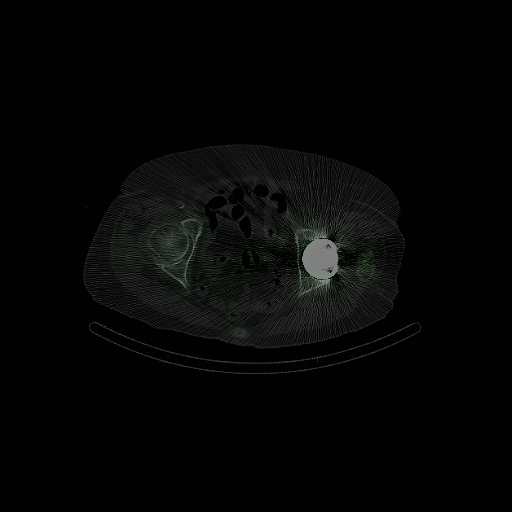
[im 49/78]
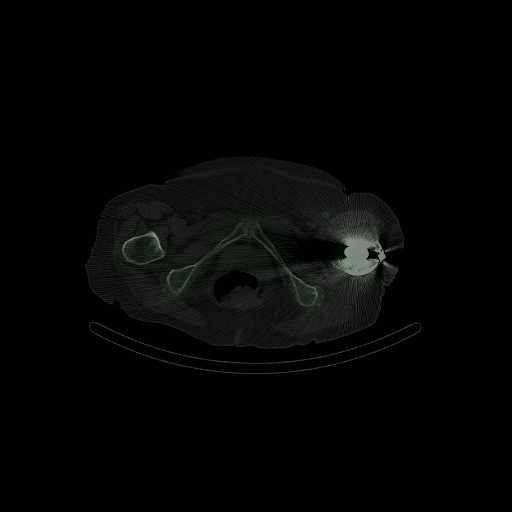
[im 58/78]
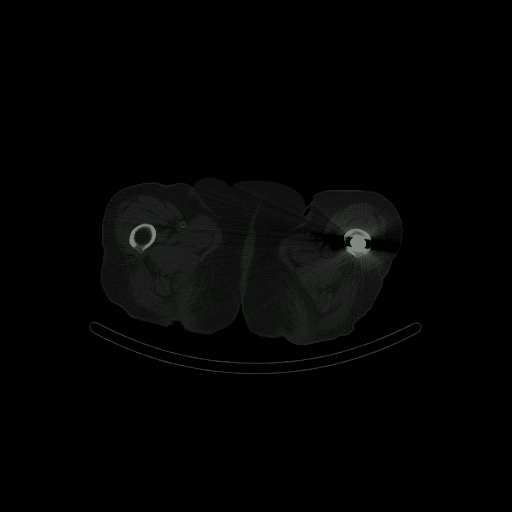
[im 68/78]
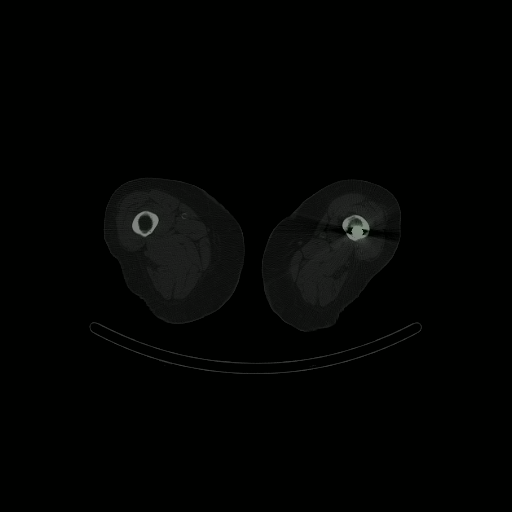
[im 78/78]
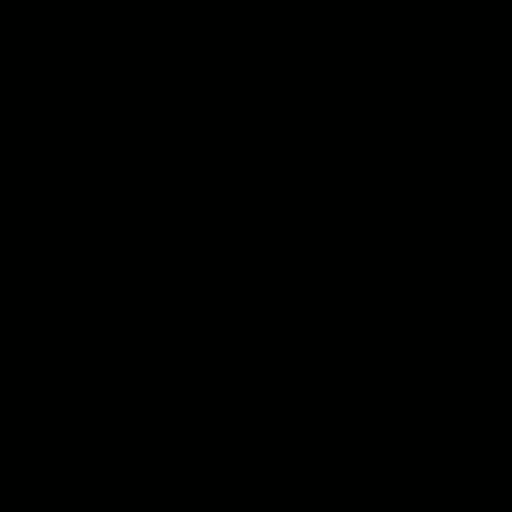

[Series 1002: coronal · 2 of 13 slices shown]
[im 1/13]
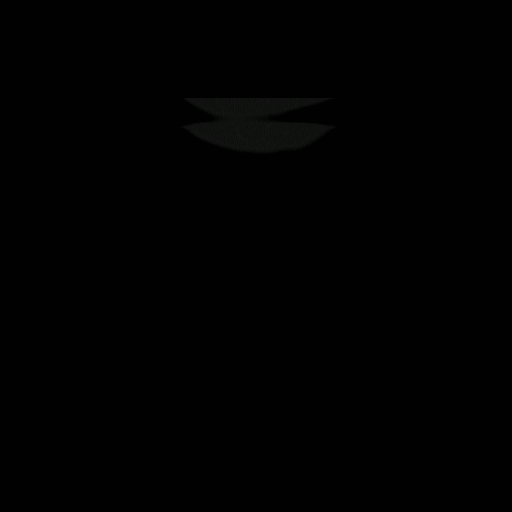
[im 13/13]
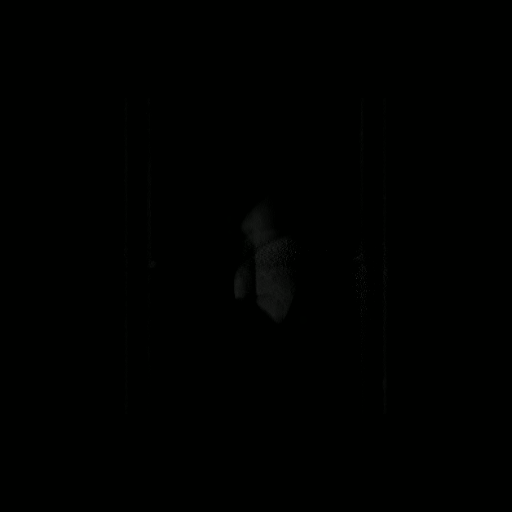

[Series 1003: sagittal · 2 of 19 slices shown]
[im 1/19]
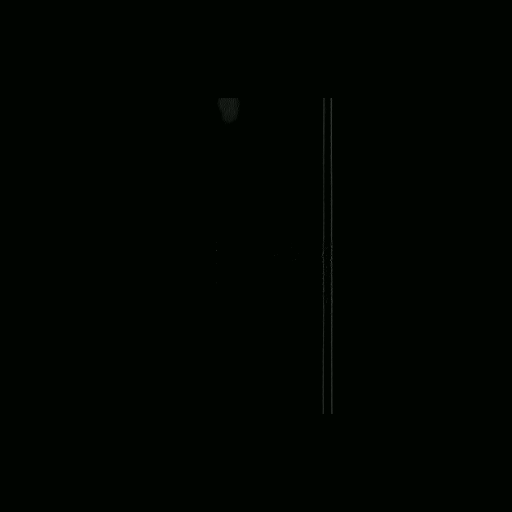
[im 19/19]
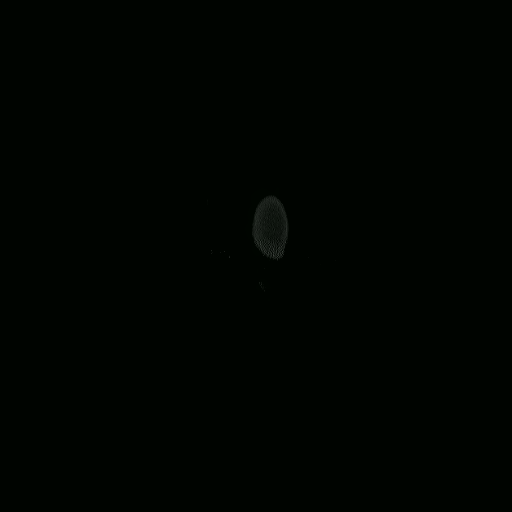

[19 of 19 positions shown; findings below may reference images not displayed]

FINDINGS: Vascular phase: No asymmetric or increased blood flow to the LEFT or
RIGHT .

Blood pool phase: No asymmetric or increased blood pool activity the
LEFT hip.

Delayed phase: Photopenia noted at the LEFT femoral head. No
abnormal radiotracer accumulation.
IMPRESSION: No evidence of loosening or infection of the LEFT hip prosthetic.

## 2020-01-13 DIAGNOSIS — K449 Diaphragmatic hernia without obstruction or gangrene: Secondary | ICD-10-CM | POA: Diagnosis not present

## 2020-01-13 DIAGNOSIS — R0602 Shortness of breath: Secondary | ICD-10-CM | POA: Diagnosis not present

## 2020-01-13 DIAGNOSIS — J9811 Atelectasis: Secondary | ICD-10-CM | POA: Diagnosis not present

## 2020-01-13 DIAGNOSIS — I517 Cardiomegaly: Secondary | ICD-10-CM | POA: Diagnosis not present

## 2020-02-01 DIAGNOSIS — R1110 Vomiting, unspecified: Secondary | ICD-10-CM | POA: Diagnosis not present

## 2020-02-01 DIAGNOSIS — M810 Age-related osteoporosis without current pathological fracture: Secondary | ICD-10-CM | POA: Diagnosis not present

## 2020-02-01 DIAGNOSIS — J4520 Mild intermittent asthma, uncomplicated: Secondary | ICD-10-CM | POA: Diagnosis not present

## 2020-02-01 DIAGNOSIS — E119 Type 2 diabetes mellitus without complications: Secondary | ICD-10-CM | POA: Diagnosis not present

## 2020-02-01 DIAGNOSIS — R351 Nocturia: Secondary | ICD-10-CM | POA: Diagnosis not present

## 2020-02-01 DIAGNOSIS — I10 Essential (primary) hypertension: Secondary | ICD-10-CM | POA: Diagnosis not present

## 2020-02-18 DIAGNOSIS — R0602 Shortness of breath: Secondary | ICD-10-CM | POA: Diagnosis not present

## 2020-02-18 DIAGNOSIS — M810 Age-related osteoporosis without current pathological fracture: Secondary | ICD-10-CM | POA: Diagnosis not present

## 2020-03-03 DIAGNOSIS — R0600 Dyspnea, unspecified: Secondary | ICD-10-CM | POA: Diagnosis not present

## 2020-03-03 DIAGNOSIS — M810 Age-related osteoporosis without current pathological fracture: Secondary | ICD-10-CM | POA: Diagnosis not present

## 2020-03-03 DIAGNOSIS — N183 Chronic kidney disease, stage 3 unspecified: Secondary | ICD-10-CM | POA: Diagnosis not present

## 2020-03-03 DIAGNOSIS — M65831 Other synovitis and tenosynovitis, right forearm: Secondary | ICD-10-CM | POA: Diagnosis not present

## 2020-03-03 DIAGNOSIS — E1122 Type 2 diabetes mellitus with diabetic chronic kidney disease: Secondary | ICD-10-CM | POA: Diagnosis not present

## 2020-03-03 DIAGNOSIS — J439 Emphysema, unspecified: Secondary | ICD-10-CM | POA: Diagnosis not present

## 2020-03-03 DIAGNOSIS — J4520 Mild intermittent asthma, uncomplicated: Secondary | ICD-10-CM | POA: Diagnosis not present

## 2020-03-03 DIAGNOSIS — I129 Hypertensive chronic kidney disease with stage 1 through stage 4 chronic kidney disease, or unspecified chronic kidney disease: Secondary | ICD-10-CM | POA: Diagnosis not present

## 2020-03-03 DIAGNOSIS — Z0000 Encounter for general adult medical examination without abnormal findings: Secondary | ICD-10-CM | POA: Diagnosis not present

## 2020-04-20 DIAGNOSIS — M1611 Unilateral primary osteoarthritis, right hip: Secondary | ICD-10-CM | POA: Diagnosis not present

## 2020-04-20 DIAGNOSIS — I10 Essential (primary) hypertension: Secondary | ICD-10-CM | POA: Diagnosis not present

## 2020-04-20 DIAGNOSIS — M533 Sacrococcygeal disorders, not elsewhere classified: Secondary | ICD-10-CM | POA: Diagnosis not present

## 2020-04-20 DIAGNOSIS — M545 Low back pain: Secondary | ICD-10-CM | POA: Diagnosis not present

## 2020-04-20 DIAGNOSIS — M47816 Spondylosis without myelopathy or radiculopathy, lumbar region: Secondary | ICD-10-CM | POA: Diagnosis not present

## 2020-05-02 DIAGNOSIS — M25512 Pain in left shoulder: Secondary | ICD-10-CM | POA: Diagnosis not present

## 2020-05-02 DIAGNOSIS — I10 Essential (primary) hypertension: Secondary | ICD-10-CM | POA: Diagnosis not present

## 2020-05-12 DIAGNOSIS — M25512 Pain in left shoulder: Secondary | ICD-10-CM | POA: Diagnosis not present

## 2020-06-07 DIAGNOSIS — Z96642 Presence of left artificial hip joint: Secondary | ICD-10-CM | POA: Diagnosis not present

## 2020-06-07 DIAGNOSIS — M24852 Other specific joint derangements of left hip, not elsewhere classified: Secondary | ICD-10-CM | POA: Diagnosis not present

## 2020-06-07 DIAGNOSIS — M25512 Pain in left shoulder: Secondary | ICD-10-CM | POA: Diagnosis not present

## 2020-06-21 DIAGNOSIS — M25512 Pain in left shoulder: Secondary | ICD-10-CM | POA: Diagnosis not present

## 2020-06-28 DIAGNOSIS — M25512 Pain in left shoulder: Secondary | ICD-10-CM | POA: Diagnosis not present

## 2020-08-11 DIAGNOSIS — J449 Chronic obstructive pulmonary disease, unspecified: Secondary | ICD-10-CM | POA: Diagnosis not present

## 2020-08-11 DIAGNOSIS — R071 Chest pain on breathing: Secondary | ICD-10-CM | POA: Diagnosis not present

## 2020-08-11 DIAGNOSIS — J9811 Atelectasis: Secondary | ICD-10-CM | POA: Diagnosis not present

## 2020-08-11 DIAGNOSIS — M25512 Pain in left shoulder: Secondary | ICD-10-CM | POA: Diagnosis not present

## 2020-08-11 DIAGNOSIS — R079 Chest pain, unspecified: Secondary | ICD-10-CM | POA: Diagnosis not present

## 2020-08-11 DIAGNOSIS — I10 Essential (primary) hypertension: Secondary | ICD-10-CM | POA: Diagnosis not present

## 2020-09-03 ENCOUNTER — Other Ambulatory Visit: Payer: Self-pay

## 2020-09-03 ENCOUNTER — Inpatient Hospital Stay: Payer: Medicare HMO

## 2020-09-03 ENCOUNTER — Emergency Department: Payer: Medicare HMO

## 2020-09-03 ENCOUNTER — Inpatient Hospital Stay
Admission: EM | Admit: 2020-09-03 | Discharge: 2020-09-05 | DRG: 871 | Disposition: A | Discharge status: Home / Self Care | Payer: Medicare HMO | Attending: Internal Medicine | Admitting: Internal Medicine

## 2020-09-03 DIAGNOSIS — N179 Acute kidney failure, unspecified: Secondary | ICD-10-CM | POA: Diagnosis not present

## 2020-09-03 DIAGNOSIS — Z7951 Long term (current) use of inhaled steroids: Secondary | ICD-10-CM

## 2020-09-03 DIAGNOSIS — E538 Deficiency of other specified B group vitamins: Secondary | ICD-10-CM | POA: Diagnosis present

## 2020-09-03 DIAGNOSIS — I441 Atrioventricular block, second degree: Secondary | ICD-10-CM | POA: Diagnosis present

## 2020-09-03 DIAGNOSIS — N390 Urinary tract infection, site not specified: Secondary | ICD-10-CM | POA: Diagnosis not present

## 2020-09-03 DIAGNOSIS — Z20822 Contact with and (suspected) exposure to covid-19: Secondary | ICD-10-CM | POA: Diagnosis present

## 2020-09-03 DIAGNOSIS — R918 Other nonspecific abnormal finding of lung field: Secondary | ICD-10-CM | POA: Diagnosis not present

## 2020-09-03 DIAGNOSIS — R110 Nausea: Secondary | ICD-10-CM | POA: Diagnosis not present

## 2020-09-03 DIAGNOSIS — R7989 Other specified abnormal findings of blood chemistry: Secondary | ICD-10-CM | POA: Insufficient documentation

## 2020-09-03 DIAGNOSIS — M1990 Unspecified osteoarthritis, unspecified site: Secondary | ICD-10-CM | POA: Diagnosis present

## 2020-09-03 DIAGNOSIS — Z96653 Presence of artificial knee joint, bilateral: Secondary | ICD-10-CM | POA: Diagnosis present

## 2020-09-03 DIAGNOSIS — Z90710 Acquired absence of both cervix and uterus: Secondary | ICD-10-CM

## 2020-09-03 DIAGNOSIS — N3000 Acute cystitis without hematuria: Secondary | ICD-10-CM | POA: Diagnosis not present

## 2020-09-03 DIAGNOSIS — R6520 Severe sepsis without septic shock: Secondary | ICD-10-CM | POA: Diagnosis not present

## 2020-09-03 DIAGNOSIS — I4892 Unspecified atrial flutter: Secondary | ICD-10-CM | POA: Diagnosis not present

## 2020-09-03 DIAGNOSIS — E1165 Type 2 diabetes mellitus with hyperglycemia: Secondary | ICD-10-CM | POA: Diagnosis present

## 2020-09-03 DIAGNOSIS — A419 Sepsis, unspecified organism: Secondary | ICD-10-CM

## 2020-09-03 DIAGNOSIS — Z7984 Long term (current) use of oral hypoglycemic drugs: Secondary | ICD-10-CM | POA: Diagnosis not present

## 2020-09-03 DIAGNOSIS — Z7982 Long term (current) use of aspirin: Secondary | ICD-10-CM | POA: Diagnosis not present

## 2020-09-03 DIAGNOSIS — J9811 Atelectasis: Secondary | ICD-10-CM | POA: Diagnosis not present

## 2020-09-03 DIAGNOSIS — A4151 Sepsis due to Escherichia coli [E. coli]: Secondary | ICD-10-CM | POA: Diagnosis not present

## 2020-09-03 DIAGNOSIS — I10 Essential (primary) hypertension: Secondary | ICD-10-CM | POA: Diagnosis not present

## 2020-09-03 DIAGNOSIS — Z825 Family history of asthma and other chronic lower respiratory diseases: Secondary | ICD-10-CM

## 2020-09-03 DIAGNOSIS — R0689 Other abnormalities of breathing: Secondary | ICD-10-CM | POA: Diagnosis not present

## 2020-09-03 DIAGNOSIS — J440 Chronic obstructive pulmonary disease with acute lower respiratory infection: Secondary | ICD-10-CM | POA: Diagnosis present

## 2020-09-03 DIAGNOSIS — R0902 Hypoxemia: Secondary | ICD-10-CM | POA: Diagnosis not present

## 2020-09-03 DIAGNOSIS — M8580 Other specified disorders of bone density and structure, unspecified site: Secondary | ICD-10-CM | POA: Diagnosis present

## 2020-09-03 DIAGNOSIS — Z79899 Other long term (current) drug therapy: Secondary | ICD-10-CM

## 2020-09-03 DIAGNOSIS — R0602 Shortness of breath: Secondary | ICD-10-CM | POA: Diagnosis not present

## 2020-09-03 DIAGNOSIS — Z8249 Family history of ischemic heart disease and other diseases of the circulatory system: Secondary | ICD-10-CM

## 2020-09-03 DIAGNOSIS — J189 Pneumonia, unspecified organism: Secondary | ICD-10-CM | POA: Diagnosis present

## 2020-09-03 DIAGNOSIS — I444 Left anterior fascicular block: Secondary | ICD-10-CM | POA: Diagnosis present

## 2020-09-03 DIAGNOSIS — Z9049 Acquired absence of other specified parts of digestive tract: Secondary | ICD-10-CM

## 2020-09-03 DIAGNOSIS — R778 Other specified abnormalities of plasma proteins: Secondary | ICD-10-CM

## 2020-09-03 DIAGNOSIS — K449 Diaphragmatic hernia without obstruction or gangrene: Secondary | ICD-10-CM | POA: Diagnosis not present

## 2020-09-03 DIAGNOSIS — R000 Tachycardia, unspecified: Secondary | ICD-10-CM | POA: Diagnosis not present

## 2020-09-03 LAB — CBC WITH DIFFERENTIAL/PLATELET
Abs Immature Granulocytes: 0.04 10*3/uL (ref 0.00–0.07)
Basophils Absolute: 0 10*3/uL (ref 0.0–0.1)
Basophils Relative: 0 %
Eosinophils Absolute: 0.1 10*3/uL (ref 0.0–0.5)
Eosinophils Relative: 1 %
HCT: 35.2 % — ABNORMAL LOW (ref 36.0–46.0)
Hemoglobin: 11.8 g/dL — ABNORMAL LOW (ref 12.0–15.0)
Immature Granulocytes: 0 %
Lymphocytes Relative: 5 %
Lymphs Abs: 0.5 10*3/uL — ABNORMAL LOW (ref 0.7–4.0)
MCH: 30.5 pg (ref 26.0–34.0)
MCHC: 33.5 g/dL (ref 30.0–36.0)
MCV: 91 fL (ref 80.0–100.0)
Monocytes Absolute: 0.2 10*3/uL (ref 0.1–1.0)
Monocytes Relative: 2 %
Neutro Abs: 10.1 10*3/uL — ABNORMAL HIGH (ref 1.7–7.7)
Neutrophils Relative %: 92 %
Platelets: 235 10*3/uL (ref 150–400)
RBC: 3.87 MIL/uL (ref 3.87–5.11)
RDW: 13.2 % (ref 11.5–15.5)
WBC: 11.1 10*3/uL — ABNORMAL HIGH (ref 4.0–10.5)
nRBC: 0 % (ref 0.0–0.2)

## 2020-09-03 LAB — BASIC METABOLIC PANEL
Anion gap: 11 (ref 5–15)
BUN: 38 mg/dL — ABNORMAL HIGH (ref 8–23)
CO2: 22 mmol/L (ref 22–32)
Calcium: 8.3 mg/dL — ABNORMAL LOW (ref 8.9–10.3)
Chloride: 102 mmol/L (ref 98–111)
Creatinine, Ser: 1.63 mg/dL — ABNORMAL HIGH (ref 0.44–1.00)
GFR, Estimated: 31 mL/min — ABNORMAL LOW (ref >60)
Glucose, Bld: 182 mg/dL — ABNORMAL HIGH (ref 70–99)
Potassium: 4.8 mmol/L (ref 3.5–5.1)
Sodium: 135 mmol/L (ref 135–145)

## 2020-09-03 LAB — URINALYSIS, COMPLETE (UACMP) WITH MICROSCOPIC
Bilirubin Urine: NEGATIVE
Glucose, UA: NEGATIVE mg/dL
Hgb urine dipstick: NEGATIVE
Ketones, ur: NEGATIVE mg/dL
Nitrite: NEGATIVE
Protein, ur: 30 mg/dL — AB
Specific Gravity, Urine: 1.01 (ref 1.005–1.030)
WBC, UA: >50 WBC/hpf — ABNORMAL HIGH (ref 0–5)
pH: 7 (ref 5.0–8.0)

## 2020-09-03 LAB — FIBRIN DERIVATIVES D-DIMER (ARMC ONLY): Fibrin derivatives D-dimer (ARMC): 2489.44 ng/mL (FEU) — ABNORMAL HIGH (ref 0.00–499.00)

## 2020-09-03 LAB — COMPREHENSIVE METABOLIC PANEL
ALT: 44 U/L (ref 0–44)
AST: 36 U/L (ref 15–41)
Albumin: 3.4 g/dL — ABNORMAL LOW (ref 3.5–5.0)
Alkaline Phosphatase: 201 U/L — ABNORMAL HIGH (ref 38–126)
Anion gap: 14 (ref 5–15)
BUN: 38 mg/dL — ABNORMAL HIGH (ref 8–23)
CO2: 21 mmol/L — ABNORMAL LOW (ref 22–32)
Calcium: 8.8 mg/dL — ABNORMAL LOW (ref 8.9–10.3)
Chloride: 103 mmol/L (ref 98–111)
Creatinine, Ser: 1.66 mg/dL — ABNORMAL HIGH (ref 0.44–1.00)
GFR, Estimated: 30 mL/min — ABNORMAL LOW (ref >60)
Glucose, Bld: 216 mg/dL — ABNORMAL HIGH (ref 70–99)
Potassium: 4.5 mmol/L (ref 3.5–5.1)
Sodium: 138 mmol/L (ref 135–145)
Total Bilirubin: 0.7 mg/dL (ref 0.3–1.2)
Total Protein: 6.7 g/dL (ref 6.5–8.1)

## 2020-09-03 LAB — PROCALCITONIN
Procalcitonin: 0.1 ng/mL
Procalcitonin: 6.14 ng/mL

## 2020-09-03 LAB — LACTIC ACID, PLASMA
Lactic Acid, Venous: 2.4 mmol/L (ref 0.5–1.9)
Lactic Acid, Venous: 2.2 mmol/L (ref 0.5–1.9)

## 2020-09-03 LAB — CBC
HCT: 31.1 % — ABNORMAL LOW (ref 36.0–46.0)
Hemoglobin: 10.6 g/dL — ABNORMAL LOW (ref 12.0–15.0)
MCH: 31 pg (ref 26.0–34.0)
MCHC: 34.1 g/dL (ref 30.0–36.0)
MCV: 90.9 fL (ref 80.0–100.0)
Platelets: 222 10*3/uL (ref 150–400)
RBC: 3.42 MIL/uL — ABNORMAL LOW (ref 3.87–5.11)
RDW: 13.5 % (ref 11.5–15.5)
WBC: 15.3 10*3/uL — ABNORMAL HIGH (ref 4.0–10.5)
nRBC: 0 % (ref 0.0–0.2)

## 2020-09-03 LAB — CORTISOL-AM, BLOOD: Cortisol - AM: 14.8 ug/dL (ref 6.7–22.6)

## 2020-09-03 LAB — RESP PANEL BY RT-PCR (FLU A&B, COVID) ARPGX2
Influenza A by PCR: NEGATIVE
Influenza B by PCR: NEGATIVE
SARS Coronavirus 2 by RT PCR: NEGATIVE

## 2020-09-03 LAB — PROTIME-INR
INR: 1.1 (ref 0.8–1.2)
Prothrombin Time: 14.2 seconds (ref 11.4–15.2)

## 2020-09-03 LAB — TROPONIN I (HIGH SENSITIVITY)
Troponin I (High Sensitivity): 24 ng/L — ABNORMAL HIGH (ref <18)
Troponin I (High Sensitivity): 51 ng/L — ABNORMAL HIGH (ref <18)

## 2020-09-03 LAB — BRAIN NATRIURETIC PEPTIDE: B Natriuretic Peptide: 170.5 pg/mL — ABNORMAL HIGH (ref 0.0–100.0)

## 2020-09-03 LAB — MAGNESIUM: Magnesium: 1.6 mg/dL — ABNORMAL LOW (ref 1.7–2.4)

## 2020-09-03 MED ORDER — ACETAMINOPHEN 650 MG RE SUPP: 650.0000 mg | Freq: Four times a day (QID) | RECTAL | Status: DC | PRN

## 2020-09-03 MED ORDER — MAGNESIUM HYDROXIDE 400 MG/5ML PO SUSP
30.0000 mL | Freq: Every day | ORAL | Status: DC | PRN
  Filled 2020-09-03: qty 30

## 2020-09-03 MED ORDER — GUAIFENESIN ER 600 MG PO TB12
600.0000 mg | ORAL_TABLET | Freq: Two times a day (BID) | ORAL | Status: DC
  Administered 2020-09-03 – 2020-09-05 (×5): 600 mg via ORAL
  Filled 2020-09-03 (×5): qty 1

## 2020-09-03 MED ORDER — SODIUM CHLORIDE 0.9 % IV SOLN
1.0000 g | Freq: Once | INTRAVENOUS | Status: AC
  Administered 2020-09-03: 1 g via INTRAVENOUS
  Filled 2020-09-03: qty 10

## 2020-09-03 MED ORDER — ASPIRIN 81 MG PO CHEW
81.0000 mg | CHEWABLE_TABLET | Freq: Two times a day (BID) | ORAL | Status: DC
  Administered 2020-09-03 – 2020-09-05 (×5): 81 mg via ORAL
  Filled 2020-09-03 (×5): qty 1

## 2020-09-03 MED ORDER — LACTATED RINGERS IV BOLUS: 1000.0000 mL | Freq: Once | INTRAVENOUS | Status: DC

## 2020-09-03 MED ORDER — HYDROCODONE-ACETAMINOPHEN 5-325 MG PO TABS: 1.0000 | ORAL_TABLET | ORAL | Status: DC | PRN

## 2020-09-03 MED ORDER — IPRATROPIUM-ALBUTEROL 0.5-2.5 (3) MG/3ML IN SOLN: 3.0000 mL | RESPIRATORY_TRACT | Status: DC | PRN

## 2020-09-03 MED ORDER — AMLODIPINE BESYLATE 10 MG PO TABS
10.0000 mg | ORAL_TABLET | Freq: Every day | ORAL | Status: DC
  Administered 2020-09-03 – 2020-09-05 (×3): 10 mg via ORAL
  Filled 2020-09-03: qty 1
  Filled 2020-09-03: qty 2
  Filled 2020-09-03: qty 1

## 2020-09-03 MED ORDER — IOHEXOL 350 MG/ML SOLN
75.0000 mL | Freq: Once | INTRAVENOUS | Status: AC | PRN
  Administered 2020-09-03: 75 mL via INTRAVENOUS

## 2020-09-03 MED ORDER — MAGNESIUM OXIDE 400 (241.3 MG) MG PO TABS
400.0000 mg | ORAL_TABLET | Freq: Every day | ORAL | Status: DC
  Administered 2020-09-03 – 2020-09-05 (×3): 400 mg via ORAL
  Filled 2020-09-03 (×6): qty 1

## 2020-09-03 MED ORDER — MAGNESIUM SULFATE 2 GM/50ML IV SOLN
2.0000 g | Freq: Once | INTRAVENOUS | Status: AC
  Administered 2020-09-03: 2 g via INTRAVENOUS
  Filled 2020-09-03: qty 50

## 2020-09-03 MED ORDER — SODIUM CHLORIDE 0.9 % IV SOLN
2.0000 g | INTRAVENOUS | Status: DC
  Administered 2020-09-03 – 2020-09-04 (×2): 2 g via INTRAVENOUS
  Filled 2020-09-03 (×3): qty 20

## 2020-09-03 MED ORDER — IPRATROPIUM-ALBUTEROL 0.5-2.5 (3) MG/3ML IN SOLN: 3.0000 mL | Freq: Four times a day (QID) | RESPIRATORY_TRACT | Status: DC

## 2020-09-03 MED ORDER — LACTATED RINGERS IV BOLUS: 500.0000 mL | Freq: Once | INTRAVENOUS | Status: DC

## 2020-09-03 MED ORDER — ACETAMINOPHEN 500 MG PO TABS
1000.0000 mg | ORAL_TABLET | Freq: Once | ORAL | Status: AC
  Administered 2020-09-03: 1000 mg via ORAL
  Filled 2020-09-03: qty 2

## 2020-09-03 MED ORDER — DOCUSATE SODIUM 100 MG PO CAPS
100.0000 mg | ORAL_CAPSULE | Freq: Two times a day (BID) | ORAL | Status: DC
  Administered 2020-09-03 – 2020-09-05 (×5): 100 mg via ORAL
  Filled 2020-09-03 (×5): qty 1

## 2020-09-03 MED ORDER — TRAZODONE HCL 50 MG PO TABS
100.0000 mg | ORAL_TABLET | Freq: Every day | ORAL | Status: DC
  Administered 2020-09-03 – 2020-09-04 (×2): 100 mg via ORAL
  Filled 2020-09-03 (×2): qty 2

## 2020-09-03 MED ORDER — LACTATED RINGERS IV BOLUS
30.0000 mL/kg | Freq: Once | INTRAVENOUS | Status: AC
  Administered 2020-09-03: 1986 mL via INTRAVENOUS

## 2020-09-03 MED ORDER — ONDANSETRON HCL 4 MG PO TABS: 4.0000 mg | ORAL_TABLET | Freq: Four times a day (QID) | ORAL | Status: DC | PRN

## 2020-09-03 MED ORDER — SODIUM CHLORIDE 0.9 % IV SOLN
500.0000 mg | INTRAVENOUS | Status: DC
  Administered 2020-09-03 – 2020-09-04 (×2): 500 mg via INTRAVENOUS
  Filled 2020-09-03: qty 500

## 2020-09-03 MED ORDER — ONDANSETRON HCL 4 MG/2ML IJ SOLN: 4.0000 mg | Freq: Four times a day (QID) | INTRAMUSCULAR | Status: DC | PRN

## 2020-09-03 MED ORDER — ENOXAPARIN SODIUM 40 MG/0.4ML ~~LOC~~ SOLN: 40.0000 mg | SUBCUTANEOUS | Status: DC

## 2020-09-03 MED ORDER — TRAZODONE HCL 50 MG PO TABS: 25.0000 mg | ORAL_TABLET | Freq: Every evening | ORAL | Status: DC | PRN

## 2020-09-03 MED ORDER — SODIUM CHLORIDE 0.9 % IV SOLN
500.0000 mg | Freq: Once | INTRAVENOUS | Status: DC
  Filled 2020-09-03: qty 500

## 2020-09-03 MED ORDER — ADULT MULTIVITAMIN W/MINERALS CH
1.0000 | ORAL_TABLET | Freq: Every day | ORAL | Status: DC
  Administered 2020-09-03 – 2020-09-05 (×3): 1 via ORAL
  Filled 2020-09-03 (×3): qty 1

## 2020-09-03 MED ORDER — VITAMIN B-12 1000 MCG PO TABS
2500.0000 ug | ORAL_TABLET | Freq: Every day | ORAL | Status: DC
  Administered 2020-09-03 – 2020-09-05 (×3): 2500 ug via ORAL
  Filled 2020-09-03 (×2): qty 3
  Filled 2020-09-03: qty 2.5

## 2020-09-03 MED ORDER — IPRATROPIUM-ALBUTEROL 0.5-2.5 (3) MG/3ML IN SOLN
3.0000 mL | Freq: Four times a day (QID) | RESPIRATORY_TRACT | Status: DC
  Administered 2020-09-03 – 2020-09-04 (×5): 3 mL via RESPIRATORY_TRACT
  Filled 2020-09-03 (×5): qty 3

## 2020-09-03 MED ORDER — ENOXAPARIN SODIUM 30 MG/0.3ML ~~LOC~~ SOLN
30.0000 mg | SUBCUTANEOUS | Status: DC
  Administered 2020-09-03 – 2020-09-05 (×3): 30 mg via SUBCUTANEOUS
  Filled 2020-09-03 (×3): qty 0.3

## 2020-09-03 MED ORDER — ACETAMINOPHEN 325 MG PO TABS
650.0000 mg | ORAL_TABLET | Freq: Four times a day (QID) | ORAL | Status: DC | PRN
  Administered 2020-09-03: 650 mg via ORAL
  Filled 2020-09-03: qty 2

## 2020-09-03 MED ORDER — ONDANSETRON 4 MG PO TBDP: 4.0000 mg | ORAL_TABLET | Freq: Three times a day (TID) | ORAL | Status: DC | PRN

## 2020-09-03 NOTE — Progress Notes (Signed)
  Chaplain On-Call responded to Order Requisition for "create or update Advance Directive".  Chaplain provided the Advance Directives documents to the patient, and also provided education through discussion about the purpose of the documents and the procedures for completion.  Patient stated that she will consider when she might want to complete the documents.  Chaplain assured the patient that Chaplains are available to assist as needed.  [place] Orissa Arreaga M.Div., [facility]

## 2020-09-03 NOTE — Progress Notes (Signed)
CODE SEPSIS - PHARMACY COMMUNICATION  **Broad Spectrum Antibiotics should be administered within 1 hour of Sepsis diagnosis**  Time Code Sepsis Called/Page Received: 3716  Antibiotics Ordered: Azithromycin and Ceftriaxone  Time of 1st antibiotic administration: 0408  Renda Rolls, PharmD, [facility] 09/03/2020 4:18 AM

## 2020-09-03 NOTE — Progress Notes (Signed)
PHARMACIST - PHYSICIAN COMMUNICATION  CONCERNING:  Enoxaparin (Lovenox) for DVT Prophylaxis    RECOMMENDATION: Patient was prescribed enoxaprin 40mg  q24 hours for VTE prophylaxis.   Filed Weights   09/03/20 0231  Weight: 79.4 kg (175 lb)    Body mass index is 25.84 kg/m.  Estimated Creatinine Clearance: 25.9 mL/min (A) (by C-G formula based on SCr of 1.66 mg/dL (H)).   Patient is candidate for enoxaparin 30mg  every 24 hours based on CrCl <60ml/min or Weight <45kg  DESCRIPTION: Pharmacy has adjusted enoxaparin dose per [facility] policy.  Patient is now receiving enoxaparin 30 mg every 24 hours   Renda Rolls, PharmD, [facility] 09/03/2020 5:22 AM

## 2020-09-03 NOTE — Progress Notes (Signed)
[facility] [place] NAME: Leslie Parker    MR#:  [id number]  DATE OF BIRTH:  November 11, 1932  SUBJECTIVE:  him in with generalized weakness and shaking spell at home. Denies any cough. The sats transiently into upper 80s. Does not wear oxygen at home. So far having a good day today. No fever documented.  REVIEW OF SYSTEMS:   Review of Systems  Constitutional: Positive for malaise/fatigue. Negative for chills, fever and weight loss.  HENT: Negative for ear discharge, ear pain and nosebleeds.   Eyes: Negative for blurred vision, pain and discharge.  Respiratory: Negative for sputum production, shortness of breath, wheezing and stridor.   Cardiovascular: Negative for chest pain, palpitations, orthopnea and PND.  Gastrointestinal: Negative for abdominal pain, diarrhea, nausea and vomiting.  Genitourinary: Negative for frequency and urgency.  Musculoskeletal: Negative for back pain and joint pain.  Neurological: Positive for weakness. Negative for sensory change, speech change and focal weakness.  Psychiatric/Behavioral: Negative for depression and hallucinations. The patient is not nervous/anxious.    Tolerating Diet:YES Tolerating PT: patient is ambulatory at home. No falls.  DRUG ALLERGIES:  No Known Allergies  VITALS:  Blood pressure 135/71, pulse 76, temperature 98.2 F (36.8 C), temperature source Oral, resp. rate (!) 22, height 5\' 9"  (1.753 m), weight 79.4 kg, SpO2 97 %.  PHYSICAL EXAMINATION:   Physical Exam  GENERAL:  87 y.o.-year-old patient lying in the bed with no acute distress.  LUNGS: DECREASED breath sounds bilaterally, no wheezing, rales, rhonchi. No use of accessory muscles of respiration.  CARDIOVASCULAR: S1, S2 normal. No murmurs, rubs, or gallops.  ABDOMEN: Soft, nontender, nondistended. Bowel sounds present. No organomegaly or mass.  EXTREMITIES: No cyanosis, clubbing or edema b/l.    NEUROLOGIC: Cranial nerves II through  XII are intact. No focal Motor or sensory deficits b/l.   PSYCHIATRIC:  patient is alert and oriented x 3.  SKIN: No obvious rash, lesion, or ulcer.   LABORATORY PANEL:  CBC Recent Labs  Lab 09/03/20 0730  WBC 15.3*  HGB 10.6*  HCT 31.1*  PLT 222    Chemistries  Recent Labs  Lab 09/03/20 0240 09/03/20 0243 09/03/20 0243 09/03/20 0730  NA  --  138   < > 135  K  --  4.5   < > 4.8  CL  --  103   < > 102  CO2  --  21*   < > 22  GLUCOSE  --  216*   < > 182*  BUN  --  38*   < > 38*  CREATININE  --  1.66*   < > 1.63*  CALCIUM  --  8.8*   < > 8.3*  MG 1.6*  --   --   --   AST  --  36  --   --   ALT  --  44  --   --   ALKPHOS  --  201*  --   --   BILITOT  --  0.7  --   --    < > = values in this interval not displayed.   Cardiac Enzymes No results for input(s): TROPONINI in the last 168 hours. RADIOLOGY:  DG Chest 1 View  Result Date: 09/03/2020 CLINICAL DATA:  Shortness of breath EXAM: CHEST  1 VIEW COMPARISON:  02/10/2015 FINDINGS: Heart size is normal. Aortic atherosclerosis and tortuosity. Mild patchy density present in the lower lobes, left more than right. This could  be atelectasis, scarring or mild basilar pneumonia. Upper lungs are clear. No evidence of heart failure or effusion. Distant shoulder replacement on the right. IMPRESSION: Mild patchy density at both lung bases, left more than right. This could be atelectasis, scarring or mild basilar pneumonia. Electronically Signed   By: Nelson Chimes M.D.   On: 09/03/2020 03:10   CT Angio Chest PE W and/or Wo Contrast  Result Date: 09/03/2020 CLINICAL DATA:  87 year old female with shortness of breath. Abnormal lung base opacity on portable chest. EXAM: CT ANGIOGRAPHY CHEST WITH CONTRAST TECHNIQUE: Multidetector CT imaging of the chest was performed using the standard protocol during bolus administration of intravenous contrast. Multiplanar CT image reconstructions and MIPs were obtained to evaluate the vascular anatomy.  CONTRAST:  62mL OMNIPAQUE IOHEXOL 350 MG/ML SOLN COMPARISON:  Portable chest 0248 hours today.  Chest CTA 02/06/2015. FINDINGS: Cardiovascular: Adequate contrast bolus timing in the pulmonary arterial tree. Mild respiratory motion. No focal filling defect identified in the pulmonary arteries to suggest acute pulmonary embolism. Mildly enlarged central pulmonary arteries (more so the right series 5, image 120) are stable since 2016. Calcified aortic atherosclerosis. Calcified coronary artery atherosclerosis. Cardiac size at the upper limits of normal. No pericardial effusion. Mediastinum/Nodes: Stable since 2016. No mediastinal lymphadenopathy. Small to moderate gastric hiatal hernia is stable. Lungs/Pleura: Trace retained secretions in the trachea. Major airways remain patent. Lower lobe atelectasis and scarring is greater on the right, but significantly improved compared to 2016. Underlying large lung volumes. No pleural effusion. Upper and middle lobes appear stable and clear. Upper Abdomen: Polycystic renal disease. Absent gallbladder with chronic biliary ductal enlargement. Stable visible upper abdominal viscera. Musculoskeletal: Osteopenia. Chronic right shoulder arthroplasty. T12 inferior endplate compression fracture is new since 2016 and age indeterminate. No paravertebral soft tissue swelling. No significant retropulsion. Otherwise stable bones. Review of the MIP images confirms the above findings. IMPRESSION: 1. Negative for acute pulmonary embolus. 2. No acute pulmonary process. Pulmonary hyperinflation. Lower lobe atelectasis and scarring but significantly improved compared to a 2016 CTA. 3. Age indeterminate T12 inferior endplate compression fracture. If specific therapy such as vertebroplasty is desired, Lumbar MRI or Nuclear Medicine Whole-body Bone Scan would best determine acuity. 4. Calcified coronary artery and Aortic Atherosclerosis (ICD10-I70.0). Electronically Signed   By: Genevie Ann M.D.   On:  09/03/2020 07:06   ASSESSMENT AND PLAN:  Leslie Parker is a 87 y.o. female with medical history significant for asthma, hypertension, type diabetes mellitus, osteoarthritis who presented to the emergency room with acute onset of  dyspnea with associated chills, bilateral arms shakiness as well as nausea without vomiting. She has been feeling fatigued and tired the whole day yesterday  severe sepsis POA suspected pneumonia/UTI -- Patient has tachycardia, tachypnea, fever 100.7.and a lactic acid of 2.4 as well as a creatinine 1.66.  -chest x-ray by basilar atelectasis versus pneumonia  - continue IV antibiotic therapy with IV Rocephin and Zithromax. -Mucolytic therapy and bronchodilator therapy will be provided. -Blood culture, urine culture, sputum culture pending. -- Lactic acid 2.4-- 2.2 -- Pro calcitonin 6.14 -- white count 11.1-- 15.3 -- patient remains afebrile   Acute on chronic kidney injury -- baseline creatinine ~ 1.2--1.4 -came in with creatinine of 1.66-- 1.63  Uncontrolled type 2 diabetes mellitus with hyperglycemia. - on supplement coverage with NovoLog. -hold off Metformin.   UTI contributing to her  severe sepsis. - on IV Rocephin and will follow urine culture and sensitivity as mentioned above.  Vitamin B12 deficiency. -continue  vitamin B12.   Essential hypertension. -Continue Norvasc.   copd without exacerbation. -Patient transiently be sats. Will place her on oxygen. She does not use home oxygen. Continue inhalers and nebs as needed  DVT prophylaxis: Lovenox. Code Status: full code. Family Communication:  The plan of care was discussed in details with the patient ( patient tells me her daughter has been informed. She is coming to visit her and on the way from [place]: Back to previous home environment Consults called: none. Status is: Inpatien   Dispo: The patient is from: Home  Anticipated d/c is to:  Home  Anticipated d/c date is:1-3 days  Patient currently is not medically stable to d/c.              Difficult to place patient No  Level of care: Med-Surg Status is: Inpatient  Patient admitted with sepsis. Awaiting blood culture, urine culture. Continue IV antibiotics and follow labs       TOTAL TIME TAKING CARE OF THIS PATIENT: *25* minutes.  >50% time spent on counselling and coordination of care  Note: This dictation was prepared with Dragon dictation along with smaller phrase technology. Any transcriptional errors that result from this process are unintentional.  Fritzi Mandes M.D    [facility]   CC: Primary care physician; Patient, No Pcp PerPatient ID: Gibson Ramp, female   DOB: [birth year], 87 y.o.   MRN: [id number]

## 2020-09-03 NOTE — H&P (Signed)
[place]   PATIENT NAME: Leslie Parker    MR#:  [id number]  DATE OF BIRTH:  04/10/1933  DATE OF ADMISSION:  09/03/2020  PRIMARY CARE PHYSICIAN: Patient, No Pcp Per   Patient is coming from: Home  REQUESTING/REFERRING PHYSICIAN: Hulan Saas, MD  CHIEF COMPLAINT:   Chief Complaint  Patient presents with  . Shortness of Breath    HISTORY OF PRESENT ILLNESS:  Leslie Parker is a 87 y.o. female with medical history significant for asthma, hypertension, type diabetes mellitus, osteoarthritis who presented to the emergency room with acute onset of  dyspnea with associated chills, bilateral arms shakiness as well as nausea without vomiting. She has been feeling fatigued and tired the whole day yesterday. She could not pray stating she could not pronounce words. She denies any significant cough or wheezing. No chest pain or palpitations. She has been having mild dysuria and urinary frequency without oliguria or hematuria or flank pain. No chest pain or palpitations. No headache or dizziness or blurred vision. No sore throat or ear ache. She was given Zofran for nausea on route to the hospital. ED Course:When she came to the ER attention was 100.7/38.2, blood pressure 162/75 with a heart rate of 127 respiratory rate of 2022. Labs revealed hyperglycemia of 216, BUN of 38 and creatinine 1.66 up from 21/1.159 in May 2019. Magnesium level is 1.6. Anion Gap was 14. Alk phos was 201 bilirubin 3.4 with 2 protein of 6.7. BNP was 178.5 and high-sensitivity troponin I was 24. Lactic acid was 2.4. CBC showed mild leukocytosis 11.1 and anemia with hemoglobin of 11.8 hematocrit of 35.2 up from 9.8 and 28.2 in May 2019. Influenza antigens and COVID-19 PCR came back negative. Urinalysis came back positive for UTI. EKG as reviewed by me : showed atrial flutter with 2-1 AV block, PVC, left anterior fascicular block and Q waves anteriorly Imaging: Chest x-ray showed mild patchy density in both lung bases,  left more than right with differential diagnosis including mild basal pneumonia, scarring and atelectasis.  The patient will get 30 mL/kg of IV lactated Ringer, 1 g p.o. Tylenol, IV Rocephin and Zithromax and 2 g of IV magnesium sulfate. She will be admitted to a medically monitored bed for further evaluation and management. PAST MEDICAL HISTORY:   Past Medical History:  Diagnosis Date  . Asthma   . Cancer (HCC)    Basal Cell  . Diabetes mellitus without complication ([place])   . Heart murmur   . Hypertension   . Impetigo   . Osteoarthritis   . Osteopenia   . Vomiting    can not due to surgery  . Wears dentures    full upper and lower    PAST SURGICAL HISTORY:   Past Surgical History:  Procedure Laterality Date  . ABDOMINAL HYSTERECTOMY    . BACK SURGERY    . BLADDER SURGERY     mesh  . CARPAL TUNNEL RELEASE Bilateral   . CATARACT EXTRACTION Right 2017  . CATARACT EXTRACTION W/PHACO Left 02/06/2016   Procedure: CATARACT EXTRACTION PHACO AND INTRAOCULAR LENS PLACEMENT ([place]) left eye;  Surgeon: Ronnell Freshwater, MD;  Location: [facility];  Service: Ophthalmology;  Laterality: Left;  DIABETIC LEFT Cannot arrive before 9:30  . CERVICAL DISC SURGERY    . CHOLECYSTECTOMY    . EYE SURGERY Bilateral    Cataract Extraction with IOL  . FEMUR IM NAIL Left 02/04/2015   Procedure: INTRAMEDULLARY (IM) NAIL FEMORAL;  Surgeon:  Thornton Park, MD;  Location: [facility];  Service: Orthopedics;  Laterality: Left;  . HARDWARE REMOVAL Left 01/17/2017   Procedure: HARDWARE REMOVAL;  Surgeon: Thornton Park, MD;  Location: [facility];  Service: Orthopedics;  Laterality: Left;  . HERNIA REPAIR  2014   esophageal and gastric mesh. patient unable to throw up d/t mesh  . HIP ARTHROPLASTY Left 01/26/2017   Procedure: ARTHROPLASTY BIPOLAR HIP (HEMIARTHROPLASTY) removal hardware left hip;  Surgeon: Earnestine Leys, MD;  Location: [facility];  Service: Orthopedics;  Laterality: Left;  . JOINT  REPLACEMENT Bilateral    knees  . REPLACEMENT TOTAL KNEE BILATERAL Bilateral [id number]  . SHOULDER ARTHROSCOPY WITH OPEN ROTATOR CUFF REPAIR AND DISTAL CLAVICLE ACROMINECTOMY Left 10/25/2016   Procedure: SHOULDER ARTHROSCOPY WITH OPEN ROTATOR CUFF REPAIR AND DISTAL CLAVICLE ACROMINECTOMY;  Surgeon: Thornton Park, MD;  Location: [facility];  Service: Orthopedics;  Laterality: Left;  . THYROID SURGERY     goiter removed  . TOTAL HIP REVISION Left 12/02/2017   Procedure: TOTAL HIP REVISION;  Surgeon: Lovell Sheehan, MD;  Location: [facility];  Service: Orthopedics;  Laterality: Left;  . TOTAL SHOULDER REPLACEMENT Right 2012    SOCIAL HISTORY:   Social History   Tobacco Use  . Smoking status: Never Smoker  . Smokeless tobacco: Never Used  Substance Use Topics  . Alcohol use: No    Alcohol/week: 0.0 standard drinks    FAMILY HISTORY:   Family History  Problem Relation Age of Onset  . Heart failure Mother   . Hypertension Mother   . Emphysema Father     DRUG ALLERGIES:  No Known Allergies  REVIEW OF SYSTEMS:   ROS As per history of present illness. All pertinent systems were reviewed above. Constitutional, HEENT, cardiovascular, respiratory, GI, GU, musculoskeletal, neuro, psychiatric, endocrine, integumentary and hematologic systems were reviewed and are otherwise negative/unremarkable except for positive findings mentioned above in the HPI.   MEDICATIONS AT HOME:   Prior to Admission medications   Medication Sig Start Date End Date Taking? Authorizing Provider  amLODipine (NORVASC) 10 MG tablet Take 10 mg by mouth daily. 01/05/14   [provider]  aspirin 81 MG chewable tablet Chew 1 tablet (81 mg total) by mouth 2 (two) times daily. 12/04/17   Lovell Sheehan, MD  Cyanocobalamin (B-12) 2500 MCG TABS Take 2,500 mcg by mouth daily.    [provider]  docusate sodium (COLACE) 100 MG capsule Take 1 capsule (100 mg total) by mouth 2 (two) times daily. 12/04/17    Lovell Sheehan, MD  Fluticasone-Salmeterol (ADVAIR DISKUS) 500-50 MCG/DOSE AEPB Inhale 1 puff into the lungs daily.  10/05/14 11/20/18  [provider]  HYDROcodone-acetaminophen (NORCO/VICODIN) 5-325 MG tablet Take 1-2 tablets by mouth every 4 (four) hours as needed for moderate pain (pain score 4-6). 12/04/17   Lovell Sheehan, MD  Magnesium Oxide 500 MG CAPS Take 500 mg by mouth daily.    [provider]  metFORMIN (GLUCOPHAGE) 500 MG tablet Take 500 mg by mouth 2 (two) times daily.  06/23/14   [provider]  Multiple Vitamin (MULTIVITAMIN WITH MINERALS) TABS tablet Take 1 tablet by mouth daily. One-A-Day Women's Vitamin    [provider]  ondansetron (ZOFRAN-ODT) 4 MG disintegrating tablet Take 4 mg by mouth every 8 (eight) hours as needed for nausea. 10/03/17   [provider]  traZODone (DESYREL) 100 MG tablet Take 100 mg by mouth at bedtime.    [provider]  VITAL SIGNS:  Blood pressure 140/72, pulse (!) 105, temperature (!) 100.7 F (38.2 C), temperature source Oral, resp. rate (!) 22, height 5' 9" (1.753 m), weight 79.4 kg, SpO2 91 %.  PHYSICAL EXAMINATION:  Physical Exam  GENERAL:  87 y.o.-year-old Caucasian female patient lying in the bed with mild respiratory distress with conversational dyspnea.  EYES: Pupils equal, round, reactive to light and accommodation. No scleral icterus. Extraocular muscles intact.  HEENT: Head atraumatic, normocephalic. Oropharynx and nasopharynx clear.  NECK:  Supple, no jugular venous distention. No thyroid enlargement, no tenderness.  LUNGS: Diminished bibasal breath sounds bibasal crackles. CARDIOVASCULAR: Regular rate and rhythm, S1, S2 normal. No murmurs, rubs, or gallops.  ABDOMEN: Soft, nondistended, nontender. Bowel sounds present. No organomegaly or mass.  EXTREMITIES: No pedal edema, cyanosis, or clubbing.  NEUROLOGIC: Cranial nerves II through XII are intact. Muscle strength 5/5 in  all extremities. Sensation intact. Gait not checked.  PSYCHIATRIC: The patient is alert and oriented x 3.  Normal affect and good eye contact. SKIN: No obvious rash, lesion, or ulcer.   LABORATORY PANEL:   CBC Recent Labs  Lab 09/03/20 0243  WBC 11.1*  HGB 11.8*  HCT 35.2*  PLT 235   ------------------------------------------------------------------------------------------------------------------  Chemistries  Recent Labs  Lab 09/03/20 0240 09/03/20 0243  NA  --  138  K  --  4.5  CL  --  103  CO2  --  21*  GLUCOSE  --  216*  BUN  --  38*  CREATININE  --  1.66*  CALCIUM  --  8.8*  MG 1.6*  --   AST  --  36  ALT  --  44  ALKPHOS  --  201*  BILITOT  --  0.7   ------------------------------------------------------------------------------------------------------------------  Cardiac Enzymes No results for input(s): TROPONINI in the last 168 hours. ------------------------------------------------------------------------------------------------------------------  RADIOLOGY:  DG Chest 1 View  Result Date: 09/03/2020 CLINICAL DATA:  Shortness of breath EXAM: CHEST  1 VIEW COMPARISON:  02/10/2015 FINDINGS: Heart size is normal. Aortic atherosclerosis and tortuosity. Mild patchy density present in the lower lobes, left more than right. This could be atelectasis, scarring or mild basilar pneumonia. Upper lungs are clear. No evidence of heart failure or effusion. Distant shoulder replacement on the right. IMPRESSION: Mild patchy density at both lung bases, left more than right. This could be atelectasis, scarring or mild basilar pneumonia. Electronically Signed   By: Nelson Chimes M.D.   On: 09/03/2020 03:10      IMPRESSION AND PLAN:  Active Problems:   Sepsis due to pneumonia ([place])  1. Bibasal community-acquired pneumonia with subsequent sepsis with severe sepsis. Patient has tachycardia, tachypnea, fever 38.2 and a lactic acid of 2.4 as well as a creatinine 1.66. The patient  has pneumonia given worsening dyspnea and chest x-ray finding. I suspect streptococcal pneumonia or H. influenzae.  -The patient will be admitted to a medically monitored bed. -We'll continue IV antibiotic therapy with IV Rocephin and Zithromax. -Mucolytic therapy and bronchodilator therapy will be provided. -We'll follow blood cultures and obtain sputum Gram stain culture and sensitivity. -We'll follow lactic acid level.  2. Acute kidney injury. -The patient will be hydrated with IV normal saline and will follow her BMP. -Nephrotoxins will be held off.  3. Uncontrolled type 2 diabetes mellitus with hyperglycemia. -The patient will be placed on supplement coverage with NovoLog. -We'll hold off Metformin.  4. UTI contributing to her sepsis and severe sepsis. -The patient will be on IV Rocephin and  will follow urine culture and sensitivity as mentioned above.  5. Vitamin B12 deficiency. -We'll continue vitamin B12.  6. Essential hypertension. -Continue Norvasc.  7. Asthma without exacerbation. -We'll hold off Advair Diskus and place her on duo nebs.  DVT prophylaxis: Lovenox. Code Status: full code. Family Communication:  The plan of care was discussed in details with the patient (and family). I answered all questions. The patient agreed to proceed with the above mentioned plan. Further management will depend upon hospital course. Disposition Plan: Back to previous home environment Consults called: none. All the records are reviewed and case discussed with ED provider.  Status is: Inpatient  Remains inpatient appropriate because:Ongoing diagnostic testing needed not appropriate for outpatient work up, Unsafe d/c plan, IV treatments appropriate due to intensity of illness or inability to take PO and Inpatient level of care appropriate due to severity of illness   Dispo: The patient is from: Home              Anticipated d/c is to: Home              Anticipated d/c date is: 3  days              Patient currently is not medically stable to d/c.   Difficult to place patient No   TOTAL TIME TAKING CARE OF THIS PATIENT: 55 minutes.    Christel Mormon M.D on 09/03/2020 at 4:33 AM  [facility]   From 7 PM-7 AM, contact night-coverage [email or web address]  CC: Primary care physician; Patient, No Pcp Per

## 2020-09-03 NOTE — Progress Notes (Signed)
Code Sepsis initiated @ 4199 AM, Elink following.

## 2020-09-03 NOTE — ED Provider Notes (Signed)
[facility] Emergency Department Provider Note  ____________________________________________   Event Date/Time   First MD Initiated Contact with Patient 09/03/20 [phone]     (approximate)  I have reviewed the triage vital signs and the nursing notes.   HISTORY  Chief Complaint Shortness of Breath   HPI Leslie Parker is a 87 y.o. female with a past medical history of asthma, DM, HTN, arthritis who presents for assessment of some shakiness in her arms associate with nausea, shortness of breath and chills that began today.  Patient was endorses little bit of burning with urination over the last day or so.  Patient denies any headache, earache, sore throat, fevers, chest pain, cough, abdominal pain, nausea, vomiting, diarrhea rash or extremity pain.  No clear leaving aggravating factors.  She has not noticed any weight gain and is not orthopneic as she is able to sleep flat.  She denies any other recent sick symptoms.  She states this feels different than her usual asthma exacerbations.  Per EMS she did get some Zofran for some nausea she had in route.         Past Medical History:  Diagnosis Date  . Asthma   . Cancer (HCC)    Basal Cell  . Diabetes mellitus without complication ([place])   . Heart murmur   . Hypertension   . Impetigo   . Osteoarthritis   . Osteopenia   . Vomiting    can not due to surgery  . Wears dentures    full upper and lower    Patient Active Problem List   Diagnosis Date Noted  . Sepsis due to pneumonia ([place]) 09/03/2020  . 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 Closed left hip fracture ([place]) 01/25/2017  . S/P hardware removal 01/17/2017  . Asthma, chronic 02/10/2015  . COPD exacerbation ([place]) 02/10/2015  . Acute on chronic respiratory failure ([place]) 02/10/2015  . Atelectasis 02/10/2015  . Hypoxia   . Shortness of breath   . Femur fracture, left ([place]) 02/03/2015  . Hypertension 02/03/2015    Past  Surgical History:  Procedure Laterality Date  . ABDOMINAL HYSTERECTOMY    . BACK SURGERY    . BLADDER SURGERY     mesh  . CARPAL TUNNEL RELEASE Bilateral   . CATARACT EXTRACTION Right 2017  . CATARACT EXTRACTION W/PHACO Left 02/06/2016   Procedure: CATARACT EXTRACTION PHACO AND INTRAOCULAR LENS PLACEMENT ([place]) left eye;  Surgeon: Ronnell Freshwater, MD;  Location: [place];  Service: Ophthalmology;  Laterality: Left;  DIABETIC LEFT Cannot arrive before 9:30  . CERVICAL DISC SURGERY    . CHOLECYSTECTOMY    . EYE SURGERY Bilateral    Cataract Extraction with IOL  . FEMUR IM NAIL Left 02/04/2015   Procedure: INTRAMEDULLARY (IM) NAIL FEMORAL;  Surgeon: Thornton Park, MD;  Location: [facility];  Service: Orthopedics;  Laterality: Left;  . HARDWARE REMOVAL Left 01/17/2017   Procedure: HARDWARE REMOVAL;  Surgeon: Thornton Park, MD;  Location: [facility];  Service: Orthopedics;  Laterality: Left;  . HERNIA REPAIR  2014   esophageal and gastric mesh. patient unable to throw up d/t mesh  . HIP ARTHROPLASTY Left 01/26/2017   Procedure: ARTHROPLASTY BIPOLAR HIP (HEMIARTHROPLASTY) removal hardware left hip;  Surgeon: Earnestine Leys, MD;  Location: [facility];  Service: Orthopedics;  Laterality: Left;  . JOINT REPLACEMENT Bilateral    knees  . REPLACEMENT TOTAL KNEE BILATERAL Bilateral [id number]  . SHOULDER ARTHROSCOPY WITH OPEN ROTATOR CUFF REPAIR  AND DISTAL CLAVICLE ACROMINECTOMY Left 10/25/2016   Procedure: SHOULDER ARTHROSCOPY WITH OPEN ROTATOR CUFF REPAIR AND DISTAL CLAVICLE ACROMINECTOMY;  Surgeon: Thornton Park, MD;  Location: [facility];  Service: Orthopedics;  Laterality: Left;  . THYROID SURGERY     goiter removed  . TOTAL HIP REVISION Left 12/02/2017   Procedure: TOTAL HIP REVISION;  Surgeon: Lovell Sheehan, MD;  Location: [facility];  Service: Orthopedics;  Laterality: Left;  . TOTAL SHOULDER REPLACEMENT Right 2012    Prior to Admission medications   Medication Sig Start  Date End Date Taking? Authorizing Provider  amLODipine (NORVASC) 10 MG tablet Take 10 mg by mouth daily. 01/05/14   [provider]  aspirin 81 MG chewable tablet Chew 1 tablet (81 mg total) by mouth 2 (two) times daily. 12/04/17   Lovell Sheehan, MD  Cyanocobalamin (B-12) 2500 MCG TABS Take 2,500 mcg by mouth daily.    [provider]  docusate sodium (COLACE) 100 MG capsule Take 1 capsule (100 mg total) by mouth 2 (two) times daily. 12/04/17   Lovell Sheehan, MD  Fluticasone-Salmeterol (ADVAIR DISKUS) 500-50 MCG/DOSE AEPB Inhale 1 puff into the lungs daily.  10/05/14 11/20/18  [provider]  HYDROcodone-acetaminophen (NORCO/VICODIN) 5-325 MG tablet Take 1-2 tablets by mouth every 4 (four) hours as needed for moderate pain (pain score 4-6). 12/04/17   Lovell Sheehan, MD  Magnesium Oxide 500 MG CAPS Take 500 mg by mouth daily.    [provider]  metFORMIN (GLUCOPHAGE) 500 MG tablet Take 500 mg by mouth 2 (two) times daily.  06/23/14   [provider]  Multiple Vitamin (MULTIVITAMIN WITH MINERALS) TABS tablet Take 1 tablet by mouth daily. One-A-Day Women's Vitamin    [provider]  ondansetron (ZOFRAN-ODT) 4 MG disintegrating tablet Take 4 mg by mouth every 8 (eight) hours as needed for nausea. 10/03/17   [provider]  traZODone (DESYREL) 100 MG tablet Take 100 mg by mouth at bedtime.    [provider]    Allergies Patient has no known allergies.  Family History  Problem Relation Age of Onset  . Heart failure Mother   . Hypertension Mother   . Emphysema Father     Social History Social History   Tobacco Use  . Smoking status: Never Smoker  . Smokeless tobacco: Never Used  Vaping Use  . Vaping Use: Never used  Substance Use Topics  . Alcohol use: No    Alcohol/week: 0.0 standard drinks  . Drug use: No    Review of Systems  Review of Systems  Constitutional: Negative for chills and fever.  HENT:  Negative for sore throat.   Eyes: Negative for pain.  Respiratory: Positive for shortness of breath. Negative for cough and stridor.   Cardiovascular: Negative for chest pain.  Gastrointestinal: Negative for vomiting.  Genitourinary: Positive for dysuria.  Musculoskeletal: Negative for myalgias.  Skin: Negative for rash.  Neurological: Positive for tremors. Negative for seizures, loss of consciousness and headaches.  Psychiatric/Behavioral: Negative for suicidal ideas.  All other systems reviewed and are negative.     ____________________________________________   PHYSICAL EXAM:  VITAL SIGNS: ED Triage Vitals  Enc Vitals Group     BP      Pulse      Resp      Temp      Temp src      SpO2      Weight      Height  Head Circumference      Peak Flow      Pain Score      Pain Loc      Pain Edu?      Excl. in [place]?    Vitals:   09/03/20 0300 09/03/20 0330  BP: (!) 151/72 140/72  Pulse: (!) 110 (!) 105  Resp: 19 (!) 22  Temp:    SpO2: 91% 91%   Physical Exam Vitals and nursing note reviewed.  Constitutional:      General: She is not in acute distress.    Appearance: She is well-developed and well-nourished.  HENT:     Head: Normocephalic and atraumatic.  Eyes:     Conjunctiva/sclera: Conjunctivae normal.  Cardiovascular:     Rate and Rhythm: Normal rate and regular rhythm.     Heart sounds: No murmur heard.   Pulmonary:     Effort: Pulmonary effort is normal. No respiratory distress.     Breath sounds: Normal breath sounds.  Abdominal:     Palpations: Abdomen is soft.     Tenderness: There is no abdominal tenderness.  Musculoskeletal:        General: No edema.     Cervical back: Neck supple.  Skin:    General: Skin is warm and dry.  Neurological:     Mental Status: She is alert.  Psychiatric:        Mood and Affect: Mood and affect normal.      ____________________________________________   LABS (all labs ordered are listed, but only abnormal  results are displayed)  Labs Reviewed  CBC WITH DIFFERENTIAL/PLATELET - Abnormal; Notable for the following components:      Result Value   WBC 11.1 (*)    Hemoglobin 11.8 (*)    HCT 35.2 (*)    Neutro Abs 10.1 (*)    Lymphs Abs 0.5 (*)    All other components within normal limits  COMPREHENSIVE METABOLIC PANEL - Abnormal; Notable for the following components:   CO2 21 (*)    Glucose, Bld 216 (*)    BUN 38 (*)    Creatinine, Ser 1.66 (*)    Calcium 8.8 (*)    Albumin 3.4 (*)    Alkaline Phosphatase 201 (*)    GFR, Estimated 30 (*)    All other components within normal limits  LACTIC ACID, PLASMA - Abnormal; Notable for the following components:   Lactic Acid, Venous 2.4 (*)    All other components within normal limits  BRAIN NATRIURETIC PEPTIDE - Abnormal; Notable for the following components:   B Natriuretic Peptide 170.5 (*)    All other components within normal limits  URINALYSIS, COMPLETE (UACMP) WITH MICROSCOPIC - Abnormal; Notable for the following components:   Color, Urine YELLOW (*)    APPearance CLOUDY (*)    Protein, ur 30 (*)    Leukocytes,Ua LARGE (*)    WBC, UA >50 (*)    Bacteria, UA FEW (*)    All other components within normal limits  MAGNESIUM - Abnormal; Notable for the following components:   Magnesium 1.6 (*)    All other components within normal limits  FIBRIN DERIVATIVES D-DIMER ([facility] ONLY) - Abnormal; Notable for the following components:   Fibrin derivatives D-dimer [facility]) 6,387.56 (*)    All other components within normal limits  TROPONIN I (HIGH SENSITIVITY) - Abnormal; Notable for the following components:   Troponin I (High Sensitivity) 24 (*)    All other components within normal limits  RESP  PANEL BY RT-PCR (FLU A&B, COVID) ARPGX2  CULTURE, BLOOD (ROUTINE X 2)  CULTURE, BLOOD (ROUTINE X 2)  PROCALCITONIN  LACTIC ACID, PLASMA  BASIC METABOLIC PANEL  CBC  CORTISOL-AM, BLOOD  PROTIME-INR  PROCALCITONIN  TROPONIN I (HIGH SENSITIVITY)    ____________________________________________  EKG  Sinus arrhythmia with a ventricular rate of 116, left anterior fascicle block, and Q waves in anterior leads are present on prior ECGs without other clear evidence of acute ischemia or other significant underlying arrhythmia. ____________________________________________  RADIOLOGY  ED MD interpretation: Some patchiness at the bilateral bases consistent with atelectasis versus pneumonia.  No large effusion, significant edema pneumothorax or other acute intrathoracic process.   Official radiology report(s): DG Chest 1 View  Result Date: 09/03/2020 CLINICAL DATA:  Shortness of breath EXAM: CHEST  1 VIEW COMPARISON:  02/10/2015 FINDINGS: Heart size is normal. Aortic atherosclerosis and tortuosity. Mild patchy density present in the lower lobes, left more than right. This could be atelectasis, scarring or mild basilar pneumonia. Upper lungs are clear. No evidence of heart failure or effusion. Distant shoulder replacement on the right. IMPRESSION: Mild patchy density at both lung bases, left more than right. This could be atelectasis, scarring or mild basilar pneumonia. Electronically Signed   By: Nelson Chimes M.D.   On: 09/03/2020 03:10    ____________________________________________   PROCEDURES  Procedure(s) performed (including Critical Care):  .Critical Care Performed by: Lucrezia Starch, MD Authorized by: Lucrezia Starch, MD   Critical care provider statement:    Critical care time (minutes):  45   Critical care time was exclusive of:  Separately billable procedures and treating other patients   Critical care was necessary to treat or prevent imminent or life-threatening deterioration of the following conditions:  Sepsis   Critical care was time spent personally by me on the following activities:  Discussions with consultants, evaluation of patient's response to treatment, examination of patient, ordering and performing  treatments and interventions, ordering and review of laboratory studies, ordering and review of radiographic studies, pulse oximetry, re-evaluation of patient's condition, obtaining history from patient or surrogate and review of old charts     ____________________________________________   INITIAL IMPRESSION / [place] / ED COURSE      Patient presents with above to history exam for evaluation of some shakiness shortness of breath chills and fatigue that all started today.  Per EMS she had an SPO2 of 92% in route to improved 94% on 2 L.  She is 93% on room air on arrival.  She is febrile 100.7, tachycardic to 127 and hypertensive with BP of 162/75 as per respiratory of 20 on arrival.  Primary differential includes but is not limited to bacterial pneumonia, viral bronchitis i.e. COVID, asthma exacerbation, urinary tract infection, ACS, arrhythmia, PE, symptomatic anemia and metabolic derangements.  Given fever, tachycardia and findings on chest x-ray concerning for pneumonia patient was given Tylenol and Rocephin and azithromycin to cover for commune acquired pneumonia on arrival.  Magnesium 1.6.  CBC with leukocytosis with WBC of 11.1, hemoglobin at baseline and no other significant derangements.  CMP remarkable for glucose of 260, creatinine of 1.6 and otherwise no significant derangements.  Seems creatinine has been 0.7-1.5 over the last 2 years.  Initial troponin is slightly elevated 24.  Suspect demand ischemia.  Will defer anticoagulant this time although we will also plan to trend.  Procalcitonin is 0.1.  Covid is negative.  BNP is 170.  However no significant edema on x-ray  to suggest acute heart failure exacerbation.  UA shows large LE S and greater than 50 WBCs consistent with possible concrement cystitis.  Dimer is also quite elevated at 2489 so ordered CTA to further assess for any evidence of PE.  I will plan to admit to medicine service for further evaluation and  management.      ____________________________________________   FINAL CLINICAL IMPRESSION(S) / ED DIAGNOSES  Final diagnoses:  Community acquired pneumonia, unspecified laterality  Sepsis, due to unspecified organism, unspecified whether acute organ dysfunction present ([place])  Hypomagnesemia  Troponin I above reference range    Medications  magnesium sulfate IVPB 2 g 50 mL (2 g Intravenous New Bag/Given 09/03/20 0458)  aspirin chewable tablet 81 mg (has no administration in time range)  HYDROcodone-acetaminophen (NORCO/VICODIN) 5-325 MG per tablet 1-2 tablet (has no administration in time range)  amLODipine (NORVASC) tablet 10 mg (has no administration in time range)  traZODone (DESYREL) tablet 100 mg (has no administration in time range)  docusate sodium (COLACE) capsule 100 mg (has no administration in time range)  vitamin B-12 (CYANOCOBALAMIN) tablet 2,500 mcg (has no administration in time range)  Magnesium Oxide TABS 400 mg (has no administration in time range)  multivitamin with minerals tablet 1 tablet (has no administration in time range)  cefTRIAXone (ROCEPHIN) 2 g in sodium chloride 0.9 % 100 mL IVPB (has no administration in time range)  azithromycin (ZITHROMAX) 500 mg in sodium chloride 0.9 % 250 mL IVPB (500 mg Intravenous New Bag/Given 09/03/20 0457)  acetaminophen (TYLENOL) tablet 650 mg (has no administration in time range)    Or  acetaminophen (TYLENOL) suppository 650 mg (has no administration in time range)  magnesium hydroxide (MILK OF MAGNESIA) suspension 30 mL (has no administration in time range)  ondansetron (ZOFRAN) tablet 4 mg (has no administration in time range)    Or  ondansetron (ZOFRAN) injection 4 mg (has no administration in time range)  guaiFENesin (MUCINEX) 12 hr tablet 600 mg (has no administration in time range)  ipratropium-albuterol (DUONEB) 0.5-2.5 (3) MG/3ML nebulizer solution 3 mL (has no administration in time range)    And   ipratropium-albuterol (DUONEB) 0.5-2.5 (3) MG/3ML nebulizer solution 3 mL (has no administration in time range)  enoxaparin (LOVENOX) injection 30 mg (has no administration in time range)  acetaminophen (TYLENOL) tablet 1,000 mg (1,000 mg Oral Given 09/03/20 0411)  cefTRIAXone (ROCEPHIN) 1 g in sodium chloride 0.9 % 100 mL IVPB (0 g Intravenous Stopped 09/03/20 0456)  lactated ringers bolus 1,986 mL (1,986 mLs Intravenous New Bag/Given 09/03/20 0407)     ED Discharge Orders    None       Note:  This document was prepared using Dragon voice recognition software and may include unintentional dictation errors.   Lucrezia Starch, MD 09/03/20 [phone]

## 2020-09-03 NOTE — ED Triage Notes (Signed)
Patient from home via [facility] with c/o SHOB, "not feeling well". On scene, EMS placed patient on 2L due to [facility], O2 sats in low 90's on RA.  EMS reports patient shaky on scene, CBG in mid 200's, which is high per patient.  Currently patient in NAD.

## 2020-09-03 NOTE — ED Notes (Signed)
Messaged RN regarding pt transfer to floor.   Awaiting response.

## 2020-09-03 NOTE — ED Notes (Signed)
Date and time results received: 09/03/20 0345  Test: Lactic Critical Value: 2.4  Name of Provider Notified: Dr. Tamala Julian  Orders Received? Or Actions Taken?: See orders

## 2020-09-03 NOTE — ED Notes (Signed)
Date and time results received: 09/03/20 0649  Test: Troponin Critical Value: Increased from 24 to 51  Name of Provider Notified: Rachael Fee, NP  Orders Received? Or Actions Taken?: Awaiting response

## 2020-09-04 DIAGNOSIS — J189 Pneumonia, unspecified organism: Secondary | ICD-10-CM

## 2020-09-04 LAB — PROCALCITONIN: Procalcitonin: 13.36 ng/mL

## 2020-09-04 LAB — CREATININE, SERUM
Creatinine, Ser: 1.54 mg/dL — ABNORMAL HIGH (ref 0.44–1.00)
GFR, Estimated: 33 mL/min — ABNORMAL LOW (ref >60)

## 2020-09-04 LAB — LACTIC ACID, PLASMA: Lactic Acid, Venous: 0.9 mmol/L (ref 0.5–1.9)

## 2020-09-04 MED ORDER — METOPROLOL TARTRATE 5 MG/5ML IV SOLN
5.0000 mg | Freq: Four times a day (QID) | INTRAVENOUS | Status: DC
  Administered 2020-09-04 – 2020-09-05 (×3): 5 mg via INTRAVENOUS
  Filled 2020-09-04 (×2): qty 5

## 2020-09-04 MED ORDER — SODIUM CHLORIDE 0.9 % IV SOLN
INTRAVENOUS | Status: DC | PRN
  Administered 2020-09-04: 250 mL via INTRAVENOUS

## 2020-09-04 MED ORDER — AZITHROMYCIN 500 MG PO TABS
250.0000 mg | ORAL_TABLET | Freq: Every day | ORAL | Status: DC
  Administered 2020-09-04 – 2020-09-05 (×2): 250 mg via ORAL
  Filled 2020-09-04 (×2): qty 1

## 2020-09-04 MED ORDER — IPRATROPIUM-ALBUTEROL 0.5-2.5 (3) MG/3ML IN SOLN: 3.0000 mL | RESPIRATORY_TRACT | Status: DC | PRN

## 2020-09-04 MED ORDER — IPRATROPIUM-ALBUTEROL 0.5-2.5 (3) MG/3ML IN SOLN
3.0000 mL | Freq: Four times a day (QID) | RESPIRATORY_TRACT | Status: DC
  Administered 2020-09-04 – 2020-09-05 (×4): 3 mL via RESPIRATORY_TRACT
  Filled 2020-09-04 (×4): qty 3

## 2020-09-04 NOTE — Progress Notes (Signed)
[facility] [place] NAME: Leslie Parker    MR#:  [id number]  DATE OF BIRTH:  03/06/1933  SUBJECTIVE:   Eating breakfast. Doing well. No fever. No cp slept ok REVIEW OF SYSTEMS:   Review of Systems  Constitutional: Positive for malaise/fatigue. Negative for chills, fever and weight loss.  HENT: Negative for ear discharge, ear pain and nosebleeds.   Eyes: Negative for blurred vision, pain and discharge.  Respiratory: Negative for sputum production, shortness of breath, wheezing and stridor.   Cardiovascular: Negative for chest pain, palpitations, orthopnea and PND.  Gastrointestinal: Negative for abdominal pain, diarrhea, nausea and vomiting.  Genitourinary: Negative for frequency and urgency.  Musculoskeletal: Negative for back pain and joint pain.  Neurological: Positive for weakness. Negative for sensory change, speech change and focal weakness.  Psychiatric/Behavioral: Negative for depression and hallucinations. The patient is not nervous/anxious.    Tolerating Diet:YES Tolerating PT: patient is ambulatory at home. No falls.  DRUG ALLERGIES:  No Known Allergies  VITALS:  Blood pressure 139/72, pulse (!) 108, temperature 98.3 F (36.8 C), temperature source Oral, resp. rate (!) 24, height 5\' 9"  (1.753 m), weight 79.4 kg, SpO2 96 %.  PHYSICAL EXAMINATION:   Physical Exam  GENERAL:  87 y.o.-year-old patient lying in the bed with no acute distress.  LUNGS: decreased breath sounds bilaterally, no wheezing, rales, rhonchi. No use of accessory muscles of respiration.  CARDIOVASCULAR: S1, S2 normal. No murmurs, rubs, or gallops.  ABDOMEN: Soft, nontender, nondistended. Bowel sounds present. No organomegaly or mass.  EXTREMITIES: No cyanosis, clubbing or edema b/l.    NEUROLOGIC: Cranial nerves II through XII are intact. No focal Motor or sensory deficits b/l.   PSYCHIATRIC:  patient is alert and oriented x 3.  SKIN: No obvious rash,  lesion, or ulcer.   LABORATORY PANEL:  CBC Recent Labs  Lab 09/03/20 0730  WBC 15.3*  HGB 10.6*  HCT 31.1*  PLT 222    Chemistries  Recent Labs  Lab 09/03/20 0240 09/03/20 0243 09/03/20 0243 09/03/20 0730 09/04/20 0508  NA  --  138   < > 135  --   K  --  4.5   < > 4.8  --   CL  --  103   < > 102  --   CO2  --  21*   < > 22  --   GLUCOSE  --  216*   < > 182*  --   BUN  --  38*   < > 38*  --   CREATININE  --  1.66*   < > 1.63* 1.54*  CALCIUM  --  8.8*   < > 8.3*  --   MG 1.6*  --   --   --   --   AST  --  36  --   --   --   ALT  --  44  --   --   --   ALKPHOS  --  201*  --   --   --   BILITOT  --  0.7  --   --   --    < > = values in this interval not displayed.   Cardiac Enzymes No results for input(s): TROPONINI in the last 168 hours. RADIOLOGY:  DG Chest 1 View  Result Date: 09/03/2020 CLINICAL DATA:  Shortness of breath EXAM: CHEST  1 VIEW COMPARISON:  02/10/2015 FINDINGS: Heart size is normal. Aortic atherosclerosis and  tortuosity. Mild patchy density present in the lower lobes, left more than right. This could be atelectasis, scarring or mild basilar pneumonia. Upper lungs are clear. No evidence of heart failure or effusion. Distant shoulder replacement on the right. IMPRESSION: Mild patchy density at both lung bases, left more than right. This could be atelectasis, scarring or mild basilar pneumonia. Electronically Signed   By: Leslie Parker M.D.   On: 09/03/2020 03:10   CT Angio Chest PE W and/or Wo Contrast  Result Date: 09/03/2020 CLINICAL DATA:  87 year old female with shortness of breath. Abnormal lung base opacity on portable chest. EXAM: CT ANGIOGRAPHY CHEST WITH CONTRAST TECHNIQUE: Multidetector CT imaging of the chest was performed using the standard protocol during bolus administration of intravenous contrast. Multiplanar CT image reconstructions and MIPs were obtained to evaluate the vascular anatomy. CONTRAST:  13mL OMNIPAQUE IOHEXOL 350 MG/ML SOLN  COMPARISON:  Portable chest 0248 hours today.  Chest CTA 02/06/2015. FINDINGS: Cardiovascular: Adequate contrast bolus timing in the pulmonary arterial tree. Mild respiratory motion. No focal filling defect identified in the pulmonary arteries to suggest acute pulmonary embolism. Mildly enlarged central pulmonary arteries (more so the right series 5, image 120) are stable since 2016. Calcified aortic atherosclerosis. Calcified coronary artery atherosclerosis. Cardiac size at the upper limits of normal. No pericardial effusion. Mediastinum/Nodes: Stable since 2016. No mediastinal lymphadenopathy. Small to moderate gastric hiatal hernia is stable. Lungs/Pleura: Trace retained secretions in the trachea. Major airways remain patent. Lower lobe atelectasis and scarring is greater on the right, but significantly improved compared to 2016. Underlying large lung volumes. No pleural effusion. Upper and middle lobes appear stable and clear. Upper Abdomen: Polycystic renal disease. Absent gallbladder with chronic biliary ductal enlargement. Stable visible upper abdominal viscera. Musculoskeletal: Osteopenia. Chronic right shoulder arthroplasty. T12 inferior endplate compression fracture is new since 2016 and age indeterminate. No paravertebral soft tissue swelling. No significant retropulsion. Otherwise stable bones. Review of the MIP images confirms the above findings. IMPRESSION: 1. Negative for acute pulmonary embolus. 2. No acute pulmonary process. Pulmonary hyperinflation. Lower lobe atelectasis and scarring but significantly improved compared to a 2016 CTA. 3. Age indeterminate T12 inferior endplate compression fracture. If specific therapy such as vertebroplasty is desired, Lumbar MRI or Nuclear Medicine Whole-body Bone Scan would best determine acuity. 4. Calcified coronary artery and Aortic Atherosclerosis (ICD10-I70.0). Electronically Signed   By: Leslie Parker M.D.   On: 09/03/2020 07:06   ASSESSMENT AND PLAN:  Leslie Parker is a 87 y.o. female with medical history significant for asthma, hypertension, type diabetes mellitus, osteoarthritis who presented to the emergency room with acute onset of  dyspnea with associated chills, bilateral arms shakiness as well as nausea without vomiting. She has been feeling fatigued and tired the whole day yesterday  severe sepsis POA suspected pneumonia/UTI -- Patient has tachycardia, tachypnea, fever 100.7.and a lactic acid of 2.4 as well as a creatinine 1.66.  -chest x-ray by basilar atelectasis versus pneumonia  - continue IV antibiotic therapy with IV Rocephin and Zithromax. -Mucolytic therapy and bronchodilator therapy will be provided. -Blood culture, urine culture, sputum culture pending. -- Lactic acid 2.4-- 2.2--0.9 -- Pro calcitonin 6.14--13.3 -- white count 11.1-- 15.3 -- patient remains afebrile --BC neg --UC GNR >100K   Acute on chronic kidney injury -- baseline creatinine ~ 1.2--1.4 -came in with creatinine of 1.66-- 1.63-->1.5  Uncontrolled type 2 diabetes mellitus with hyperglycemia. - on supplement coverage with NovoLog. -hold off Metformin.   UTI contributing to her  severe  sepsis. - on IV Rocephin and will follow urine culture and sensitivity as mentioned above.  Vitamin B12 deficiency. -continue vitamin B12.   Essential hypertension. -Continue Norvasc.   copd without exacerbation. -Patient transiently be sats. Will place her on oxygen. She does not use home oxygen. Continue inhalers and nebs as needed  DVT prophylaxis: Lovenox. Code Status: full code. Family Communication:  dter Leslie Parker [phone] Disposition Plan: Back to previous home environment Consults called: none. Status is: Inpatien   Dispo: The patient is from: Home  Anticipated d/c is to: Home  Anticipated d/c date is:1 day  Patient currently is imprvoing overall               Difficult to place patient No  Level of care:  Med-Surg Status is: Inpatient  Patient admitted with sepsis. Awaiting blood culture, urine culture. Continue IV antibiotics and follow labs  Will you to monitor one more day for patient. If clinically improves will discharge her to home tomorrow. Patient and daughter are in agreement.     TOTAL TIME TAKING CARE OF THIS PATIENT: *25* minutes.  >50% time spent on counselling and coordination of care  Note: This dictation was prepared with Dragon dictation along with smaller phrase technology. Any transcriptional errors that result from this process are unintentional.  Fritzi Mandes M.D    [facility]   CC: Primary care physician; Patient, No Pcp PerPatient ID: Leslie Parker, female   DOB: August 31, 1932, 87 y.o.   MRN: [id number]

## 2020-09-04 NOTE — Evaluation (Signed)
Physical Therapy Evaluation Patient Details Name: Leslie Parker MRN: [id number] DOB: Feb 24, 1933 Today's Date: 09/04/2020   History of Present Illness  Pt is an 87 y/o F admitted from home on 09/03/20 with c/c of SOB. Pt currently being treated for bibasal community-acquired pneumonia with subsequent sepsis with severe sepsis. PMH: asthma, HTN, DM2, OA, basal cell CA, HTN, heart murmur, osteopenia  Clinical Impression  Pt seen for PT evaluation in setting of slightly elevated HR (per chart) after being cleared by MD but pt with improved HR during session, see below. Pt was able to tolerate room air during session with SPO2 >/=88% & pt denying feeling SOB. Pt ambulates 2 laps around nurses station, first without AD & CGA as pt with lateral sway & pt electing to hold to furniture in room to stabilize when ambulating in room without AD. Provided PT with RW & educated pt on proper use of AD with pt able to ambulate 2nd lap with supervision. Encouraged pt to use RW upon d/c home & pt also reports her daughter plans to stay with her for a few days upon return home. Will continue to follow pt acutely to progress gait with LRAD & focus on high level dynamic balance.      Follow Up Recommendations Home health PT    Equipment Recommendations  None recommended by PT    Recommendations for Other Services       Precautions / Restrictions Precautions Precautions: Fall Restrictions Weight Bearing Restrictions: No      Mobility  Bed Mobility Overal bed mobility: Modified Independent                  Transfers Overall transfer level: Needs assistance Equipment used: None Transfers: Sit to/from Stand Sit to Stand: Supervision            Ambulation/Gait Ambulation/Gait assistance: Min guard;Supervision Gait Distance (Feet): 175 Feet (+ 175 ft) Assistive device: None;Rolling walker (2 wheeled)       General Gait Details: decreased dorsiflexion LLE & heel strike (pt reports weakness  2/2 hx of hip replacements, mild lateral sway improved with RW  Stairs            Wheelchair Mobility    Modified Rankin (Stroke Patients Only)       Balance Overall balance assessment: Needs assistance Sitting-balance support: Feet supported;No upper extremity supported Sitting balance-[name]: Good     Standing balance support: No upper extremity supported Standing balance-[name]: Poor Standing balance comment: CGA for gait without AD, supervision with RW                             Pertinent Vitals/Pain Pain Assessment: No/denies pain    Home Living Family/patient expects to be discharged to:: Private residence Living Arrangements: Alone   Type of Home: Apartment Home Access: Level entry     Home Layout: One level Home Equipment: Walker - 4 wheels;Cane - single point;Walker - 2 wheels Additional Comments: Pt lives in senior living apartment, handicap accessible    Prior Function Level of Independence: Independent         Comments: Pt reports independent without AD, only uses rollator to carry groceries into house, driving     Hand Dominance        Extremity/Trunk Assessment   Upper Extremity Assessment Upper Extremity Assessment: Overall WFL for tasks assessed    Lower Extremity Assessment Lower Extremity Assessment: Generalized weakness  Communication   Communication:  (appears somewhat HOH but denies)  Cognition Arousal/Alertness: Awake/alert Behavior During Therapy: WFL for tasks assessed/performed Overall Cognitive Status: Within Functional Limits for tasks assessed                                        General Comments General comments (skin integrity, edema, etc.): Pt on 1L/min via nasal cannula upon PT arrival HR 88-89 bpm at rest, O2 92-95%, Pt placed on room air for remainder of session & lowest SpO2 88% with pt able to quickly recover >90%. HR up to 126 bpm after ambulating 2nd lap. Pt  denies feeling SOB.    Exercises     Assessment/Plan    PT Assessment Patient needs continued PT services  PT Problem List Decreased mobility;Cardiopulmonary status limiting activity;Decreased activity tolerance;Decreased balance       PT Treatment Interventions DME instruction;Therapeutic activities;Gait training;Patient/family education;Therapeutic exercise;Balance training;Neuromuscular re-education    PT Goals (Current goals can be found in the Care Plan section)  Acute Rehab PT Goals Patient Stated Goal: get better PT Goal Formulation: With patient Time For Goal Achievement: 09/18/20 Potential to Achieve Goals: Good    Frequency Min 2X/week   Barriers to discharge Decreased caregiver support lives alone    Co-evaluation               AM-PAC PT "6 Clicks" Mobility  Outcome Measure Help needed turning from your back to your side while in a flat bed without using bedrails?: None Help needed moving from lying on your back to sitting on the side of a flat bed without using bedrails?: None Help needed moving to and from a bed to a chair (including a wheelchair)?: None Help needed standing up from a chair using your arms (e.g., wheelchair or bedside chair)?: None Help needed to walk in hospital room?: A Little Help needed climbing 3-5 steps with a railing? : A Little 6 Click Score: 22    End of Session Equipment Utilized During Treatment: Gait belt Activity Tolerance: Patient tolerated treatment well Patient left: in chair;with call bell/phone within reach;with chair alarm set Nurse Communication:  (O2) PT Visit Diagnosis: Unsteadiness on feet (R26.81);Difficulty in walking, not elsewhere classified (R26.2)    Time: 0086-7619 PT Time Calculation (min) (ACUTE ONLY): 23 min   Charges:   PT Evaluation $PT Eval Low Complexity: 1 Low PT Treatments $Therapeutic Activity: 8-22 mins        Lavone Nian, PT, DPT 09/04/20, 4:24 PM   Waunita Schooner 09/04/2020, 4:21 PM

## 2020-09-04 NOTE — Progress Notes (Signed)
   09/04/20 0818  Assess: MEWS Score  Temp 99 F (37.2 C)  BP (!) 172/78  Pulse Rate (!) 109  Resp (!) 27  SpO2 96 %  Assess: MEWS Score  MEWS Temp 0  MEWS Systolic 0  MEWS Pulse 1  MEWS RR 2  MEWS LOC 0  MEWS Score 3  MEWS Score Color Yellow  Assess: if the MEWS score is Yellow or Red  Were vital signs taken at a resting state? Yes  Focused Assessment No change from prior assessment  Early Detection of Sepsis Score *See Row Information* Medium  MEWS guidelines implemented *See Row Information* Yes  Treat  MEWS Interventions Escalated (See documentation below)  Take Vital Signs  Increase Vital Sign Frequency  Yellow: Q 2hr X 2 then Q 4hr X 2, if remains yellow, continue Q 4hrs  Escalate  MEWS: Escalate Yellow: discuss with charge nurse/RN and consider discussing with provider and RRT  Notify: Charge Nurse/RN  Name of Charge Nurse/RN Notified Caryl Pina, RN  Date Charge Nurse/RN Notified 09/04/20  Time Charge Nurse/RN Notified 0830  Document  Patient Outcome Other (Comment) (Continue to monitor)

## 2020-09-05 LAB — URINE CULTURE: Culture: >=100000 — AB

## 2020-09-05 MED ORDER — AZITHROMYCIN 250 MG PO TABS: 250.0000 mg | ORAL_TABLET | Freq: Every day | ORAL | 0 refills | Status: AC

## 2020-09-05 MED ORDER — CEFDINIR 300 MG PO CAPS: 300.0000 mg | ORAL_CAPSULE | Freq: Two times a day (BID) | ORAL | 0 refills | Status: AC

## 2020-09-05 MED ORDER — CEFDINIR 300 MG PO CAPS
300.0000 mg | ORAL_CAPSULE | Freq: Every day | ORAL | Status: DC
  Filled 2020-09-05: qty 1

## 2020-09-05 NOTE — TOC Transition Note (Signed)
Transition of Care [facility]) - CM/SW Discharge Note   Patient Details  Name: Leslie Parker MRN: [id number] Date of Birth: [birth year]  [facility] CM/SW Contact:  Shelbie Hutching, RN Phone Number: 09/05/2020, 12:44 PM   Clinical Narrative:    Patient has been medically cleared for discharge home with home health services.  Patient does not need oxygen at home.  [place] with [facility] notified that patient will discharge today.  Patient's daughter will pick her up.    Final next level of care: [place] Barriers to Discharge: Barriers Resolved   Patient Goals and CMS Choice Patient states their goals for this hospitalization and ongoing recovery are:: To get back home CMS Medicare.gov Compare Post Acute Care list provided to:: Patient Choice offered to / list presented to : Patient  Discharge Placement                       Discharge Plan and Services   Discharge Planning Services: CM Consult Post Acute Care Choice: Home Health          DME Arranged: N/A DME Agency: NA       HH Arranged: RN,PT,OT [place] Agency: [facility] Date [place] Agency Contacted: 09/05/20 Time [place]: 1244 Representative spoke with at [place]: [place] ([place]) Interventions     Readmission Risk Interventions No flowsheet data found.

## 2020-09-05 NOTE — Progress Notes (Signed)
Pt 94% ambulating on room air.

## 2020-09-05 NOTE — Plan of Care (Signed)
  Problem: Education: Goal: Knowledge of General Education information will improve Description: Including pain rating scale, medication(s)/side effects and non-pharmacologic comfort measures Outcome: Progressing   Problem: Clinical Measurements: Goal: Ability to maintain clinical measurements within normal limits will improve Outcome: Progressing Goal: Will remain free from infection Outcome: Progressing Goal: Diagnostic test results will improve Outcome: Progressing Goal: Respiratory complications will improve Outcome: Progressing   Problem: Activity: Goal: Risk for activity intolerance will decrease Outcome: Progressing   Problem: Nutrition: Goal: Adequate nutrition will be maintained Outcome: Progressing   Problem: Elimination: Goal: Will not experience complications related to bowel motility Outcome: Progressing Goal: Will not experience complications related to urinary retention Outcome: Progressing   Problem: Pain Managment: Goal: General experience of comfort will improve Outcome: Progressing   Problem: Safety: Goal: Ability to remain free from injury will improve Outcome: Progressing

## 2020-09-05 NOTE — TOC Initial Note (Signed)
Transition of Care [facility]) - Initial/Assessment Note    Patient Details  Name: Leslie Parker MRN: [id number] Date of Birth: [birth year]  [facility] CM/SW Contact:    Shelbie Hutching, RN Phone Number: 09/05/2020, 11:28 AM  Clinical Narrative:                 Patient admitted to the hospital with Sepsis and community acquired pneumonia.  Patient is on acute O2 at 1-2 L, patient is not on oxygen at home.  Patient reports that she is independent at home and uses a rollator.  Patient drives.  PCP is Dr. Tressia Miners.  Daughter, Lattie Haw, is visiting from [place] and will be able to pick patient up at discharge.   Patient agrees to home health services and chooses [facility] for RN, PT, and OT.    Expected Discharge Plan: [place] Barriers to Discharge: Continued Medical Work up   Patient Goals and CMS Choice Patient states their goals for this hospitalization and ongoing recovery are:: To get back home CMS Medicare.gov Compare Post Acute Care list provided to:: Patient Choice offered to / list presented to : Patient  Expected Discharge Plan and Services Expected Discharge Plan: [place]   Discharge Planning Services: CM Consult Post Acute Care Choice: [place] arrangements for the past 2 months: Apartment                 DME Arranged: N/A DME Agency: NA       HH Arranged: RN,PT,OT [place] Agency: [facility] Date [place]: 09/05/20 Time HH Agency Contacted: 75 Representative spoke with at [place]: [place] Arrangements/Services Living arrangements for the past 2 months: [place] with:: Self Patient language and need for interpreter reviewed:: Yes Do you feel safe going back to the place where you live?: Yes      Need for Family Participation in Patient Care: Yes (Comment) (COPD) Care giver support system in place?: Yes (comment) (daughter) Current home services: DME (rollator) Criminal  Activity/Legal Involvement Pertinent to Current Situation/Hospitalization: No - Comment as needed  Activities of Daily Living Home Assistive Devices/Equipment: Grab bars around toilet,Grab bars in shower,Walker (specify type),Dentures (specify type),Shower chair with back,Raised toilet seat with rails ADL Screening (condition at time of admission) Patient's cognitive ability adequate to safely complete daily activities?: Yes Is the patient deaf or have difficulty hearing?: No Does the patient have difficulty seeing, even when wearing glasses/contacts?: No Does the patient have difficulty concentrating, remembering, or making decisions?: No Patient able to express need for assistance with ADLs?: Yes Does the patient have difficulty dressing or bathing?: No Independently performs ADLs?: Yes (appropriate for developmental age) Does the patient have difficulty walking or climbing stairs?: Yes Weakness of Legs: Both Weakness of Arms/Hands: Both  Permission Sought/Granted Permission sought to share information with : Case Manager,Family Supports,Other (comment) Permission granted to share information with : Yes, Verbal Permission Granted  Share Information with NAME: Lattie Haw  Permission granted to share info w AGENCY: [facility]  Permission granted to share info w Relationship: daughter     Emotional Assessment Appearance:: Appears stated age Attitude/Demeanor/Rapport: Engaged Affect (typically observed): Accepting Orientation: : Oriented to Self,Oriented to Place,Oriented to  Time,Oriented to Situation Alcohol / Substance Use: Not Applicable Psych Involvement: No (comment)  Admission diagnosis:  Hypomagnesemia [E83.42] Acute cystitis without hematuria [N30.00] Troponin I above reference range [R77.8] Sepsis due to pneumonia ([place]) [J18.9, A41.9] Community acquired pneumonia,  unspecified laterality [J18.9] Sepsis, due to unspecified organism, unspecified whether acute organ dysfunction  present [facility]) [A41.9] Patient Active Problem List   Diagnosis Date Noted  . Community acquired pneumonia   . Hypomagnesemia   . Sepsis ([place]) 09/03/2020  . Acute cystitis without hematuria   . Troponin I above reference range   . 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 Closed left hip fracture ([place]) 01/25/2017  . S/P hardware removal 01/17/2017  . Asthma, chronic 02/10/2015  . COPD exacerbation ([place]) 02/10/2015  . Acute on chronic respiratory failure ([place]) 02/10/2015  . Atelectasis 02/10/2015  . Hypoxia   . Shortness of breath   . Femur fracture, left ([place]) 02/03/2015  . Hypertension 02/03/2015   PCP:  Patient, No Pcp Per Pharmacy:   [id number] [place], [place] [place] [address] Phone: [phone] Fax: [phone]     Social Determinants of Health (SDOH) Interventions    Readmission Risk Interventions No flowsheet data found.

## 2020-09-05 NOTE — Progress Notes (Deleted)
Pt sp02 87% ambulating on room air. Comes up to 94% at rest on room air.

## 2020-09-05 NOTE — Discharge Summary (Signed)
[facility] [place] NAME: Leslie Parker    MR#:  [id number]  DATE OF BIRTH:  1932-08-17  DATE OF ADMISSION:  09/03/2020 ADMITTING PHYSICIAN: Christel Mormon, MD  DATE OF DISCHARGE: 09/05/2020  PRIMARY CARE PHYSICIAN: Gladstone Lighter, MD    ADMISSION DIAGNOSIS:  Hypomagnesemia [E83.42] Acute cystitis without hematuria [N30.00] Troponin I above reference range [R77.8] Sepsis due to pneumonia ([place]) [J18.9, A41.9] Community acquired pneumonia, unspecified laterality [J18.9] Sepsis, due to unspecified organism, unspecified whether acute organ dysfunction present ([place]) [A41.9]  DISCHARGE DIAGNOSIS:  Severe sepsis secondary to E. coli UTI and pneumonia   SECONDARY DIAGNOSIS:   Past Medical History:  Diagnosis Date  . Asthma   . Cancer (HCC)    Basal Cell  . Diabetes mellitus without complication ([place])   . Heart murmur   . Hypertension   . Impetigo   . Osteoarthritis   . Osteopenia   . Vomiting    can not due to surgery  . Wears dentures    full upper and lower    HOSPITAL COURSE:   Leslie Kotara Catesis a 87 y.o.femalewith medical history significant forasthma, hypertension, type diabetes mellitus, osteoarthritis who presented to the emergency room with acute onset of dyspnea with associated chills, bilateral arms shakiness as well as nausea without vomiting.She has been feeling fatigued and tired the whole day yesterday  severe sepsis POA suspected pneumonia/UTI -- Patient has tachycardia, tachypnea, fever 100.7.and a lactic acid of 2.4 as well as a creatinine 1.66.  --Sepsis resolved -chest x-ray by basilar atelectasis versus pneumonia  -   IV antibiotic therapy with IV Rocephin and Zithromax--change to oral abxs -Mucolytic therapy and bronchodilator therapy will be provided. -Blood culture negative in 2 days -- Lactic acid 2.4-- 2.2--0.9 -- Pro calcitonin 6.14--13.3 -- white count 11.1-- 15.3 -- patient remains  afebrile --BC neg --UC GNR >100K>ecoli -- oxygen sats 94% on room air on ambulation. Patient remains afebrile and feels back to baseline.   Acute on chronic kidney injury -- baseline creatinine ~ 1.2--1.4 -came in with creatinine of 1.66-- 1.63-->1.5  Uncontrolled type2diabetes mellitus with hyperglycemia. - on supplement coverage with NovoLog. -resume  Metformin at d/c  UTI contributing to her  severe sepsis. - on IV Rocephin change to po oral abxs  Vitamin B12 deficiency. -continue vitamin B12.   Essential hypertension. -Continue Norvasc.   copd without exacerbation. -Patient transiently be sats. Will place her on oxygen. She does not use home oxygen. Continue inhalers and nebs as needed --sats on RA ambulation per RN--94%  DVT prophylaxis:Lovenox. Code Status:full code. Family Communication:dter Leslie Parker [phone] in the room Disposition Plan:Back to previous home environment Consults called:none. Status is: Inpatient   Dispo: The patient is from:Home Anticipated d/c is ER:XVQM Anticipated d/c date GQ:QPYPP Patient currently is imprvoing overall  Difficult to place patient No CONSULTS OBTAINED:    DRUG ALLERGIES:  No Known Allergies  DISCHARGE MEDICATIONS:   Allergies as of 09/05/2020   No Known Allergies     Medication List    STOP taking these medications   aspirin 81 MG chewable tablet   docusate sodium 100 MG capsule Commonly known as: COLACE   HYDROcodone-acetaminophen 5-325 MG tablet Commonly known as: NORCO/VICODIN   ondansetron 4 MG disintegrating tablet Commonly known as: ZOFRAN-ODT     TAKE these medications   Advair Diskus 500-50 MCG/DOSE Aepb Generic drug: Fluticasone-Salmeterol Inhale 1 puff into the lungs daily.   amLODipine 10 MG tablet Commonly known  as: NORVASC Take 10 mg by mouth daily.   ascorbic acid 250 MG Chew Commonly known as: VITAMIN C Chew 250  mg by mouth daily.   azithromycin 250 MG tablet Commonly known as: ZITHROMAX Take 1 tablet (250 mg total) by mouth daily for 3 days. Start taking on: September 06, 2020   B-12 2500 MCG Tabs Take 2,500 mcg by mouth daily.   cefdinir 300 MG capsule Commonly known as: OMNICEF Take 1 capsule (300 mg total) by mouth every 12 (twelve) hours for 6 days.   cholecalciferol 25 MCG (1000 UNIT) tablet Commonly known as: VITAMIN D3 Take 1,000 Units by mouth daily.   Magnesium Oxide 500 MG Caps Take 500 mg by mouth daily.   metFORMIN 500 MG tablet Commonly known as: GLUCOPHAGE Take 500 mg by mouth 2 (two) times daily.   multivitamin with minerals Tabs tablet Take 1 tablet by mouth daily. One-A-Day Women's Vitamin   traZODone 100 MG tablet Commonly known as: DESYREL Take 100 mg by mouth at bedtime.   Trelegy Ellipta 100-62.5-25 MCG/INH Aepb Generic drug: Fluticasone-Umeclidin-Vilant Inhale 1 puff into the lungs daily.       If you experience worsening of your admission symptoms, develop shortness of breath, life threatening emergency, suicidal or homicidal thoughts you must seek medical attention immediately by calling 911 or calling your MD immediately  if symptoms less severe.  You Must read complete instructions/literature along with all the possible adverse reactions/side effects for all the Medicines you take and that have been prescribed to you. Take any new Medicines after you have completely understood and accept all the possible adverse reactions/side effects.   Please note  You were cared for by a hospitalist during your hospital stay. If you have any questions about your discharge medications or the care you received while you were in the hospital after you are discharged, you can call the unit and asked to speak with the hospitalist on call if the hospitalist that took care of you is not available. Once you are discharged, your primary care physician will handle any further  medical issues. Please note that NO REFILLS for any discharge medications will be authorized once you are discharged, as it is imperative that you return to your primary care physician (or establish a relationship with a primary care physician if you do not have one) for your aftercare needs so that they can reassess your need for medications and monitor your lab values. Today   SUBJECTIVE   Patient feels a lot better. Daughter Leslie Parker in the room. Patient wants to go home. No fever eating well. No shortness of breath.  VITAL SIGNS:  Blood pressure 138/90, pulse 69, temperature 98.2 F (36.8 C), temperature source Oral, resp. rate 16, height 5\' 9"  (1.753 m), weight 79.4 kg, SpO2 94 %.  I/O:    Intake/Output Summary (Last 24 hours) at 09/05/2020 1234 Last data filed at 09/05/2020 1012 Gross per 24 hour  Intake 240 ml  Output 250 ml  Net -10 ml    PHYSICAL EXAMINATION:  GENERAL:  87 y.o.-year-old patient lying in the bed with no acute distress.  LUNGS: Normal breath sounds bilaterally, no wheezing, rales,rhonchi or crepitation. No use of accessory muscles of respiration.  CARDIOVASCULAR: S1, S2 normal. No murmurs, rubs, or gallops.  ABDOMEN: Soft, non-tender, non-distended. Bowel sounds present. No organomegaly or mass.  EXTREMITIES: No pedal edema, cyanosis, or clubbing.  NEUROLOGIC: Cranial nerves II through XII are intact. Muscle strength 5/5 in all extremities. Sensation  intact. Gait not checked.  PSYCHIATRIC: The patient is alert and oriented x 3.  SKIN: No obvious rash, lesion, or ulcer.   DATA REVIEW:   CBC  Recent Labs  Lab 09/03/20 0730  WBC 15.3*  HGB 10.6*  HCT 31.1*  PLT 222    Chemistries  Recent Labs  Lab 09/03/20 0240 09/03/20 0243 09/03/20 0243 09/03/20 0730 09/04/20 0508  NA  --  138   < > 135  --   K  --  4.5   < > 4.8  --   CL  --  103   < > 102  --   CO2  --  21*   < > 22  --   GLUCOSE  --  216*   < > 182*  --   BUN  --  38*   < > 38*  --    CREATININE  --  1.66*   < > 1.63* 1.54*  CALCIUM  --  8.8*   < > 8.3*  --   MG 1.6*  --   --   --   --   AST  --  36  --   --   --   ALT  --  44  --   --   --   ALKPHOS  --  201*  --   --   --   BILITOT  --  0.7  --   --   --    < > = values in this interval not displayed.    Microbiology Results   Recent Results (from the past 240 hour(s))  Blood culture (routine x 2)     Status: None (Preliminary result)   Collection Time: 09/03/20  2:38 AM   Specimen: BLOOD  Result Value Ref Range Status   Specimen Description BLOOD BLOOD LEFT HAND  Final   Special Requests   Final    BOTTLES DRAWN AEROBIC AND ANAEROBIC Blood Culture results may not be optimal due to an inadequate volume of blood received in culture bottles   Culture   Final    NO GROWTH 2 DAYS Performed at [facility], [address], [place], [place] [address]    Report Status PENDING  Incomplete  Blood culture (routine x 2)     Status: None (Preliminary result)   Collection Time: 09/03/20  2:43 AM   Specimen: BLOOD  Result Value Ref Range Status   Specimen Description BLOOD LEFT ANTECUBITAL  Final   Special Requests   Final    BOTTLES DRAWN AEROBIC AND ANAEROBIC Blood Culture results may not be optimal due to an inadequate volume of blood received in culture bottles   Culture   Final    NO GROWTH 2 DAYS Performed at [facility], [address], [place], [place] [address]    Report Status PENDING  Incomplete  Urine Culture     Status: Abnormal   Collection Time: 09/03/20  2:43 AM   Specimen: Urine, Clean Catch  Result Value Ref Range Status   Specimen Description   Final    URINE, CLEAN CATCH Performed at [facility], [address] [place], [place] [address]    Special Requests   Final    NONE Performed at [facility], [address] [place], [place] [address]    Culture >=100,000 COLONIES/mL ESCHERICHIA COLI (A)  Final   Report Status 09/05/2020 FINAL  Final    Organism ID, Bacteria ESCHERICHIA COLI (A)  Final      Susceptibility   Escherichia coli - MIC*    AMPICILLIN 8 SENSITIVE Sensitive     CEFAZOLIN <=4 SENSITIVE Sensitive     CEFEPIME <=0.12 SENSITIVE Sensitive     CEFTRIAXONE <=0.25 SENSITIVE Sensitive     CIPROFLOXACIN >=4 RESISTANT Resistant     GENTAMICIN <=1 SENSITIVE Sensitive     IMIPENEM <=0.25 SENSITIVE Sensitive     NITROFURANTOIN <=16 SENSITIVE Sensitive     TRIMETH/SULFA <=20 SENSITIVE Sensitive     AMPICILLIN/SULBACTAM 4 SENSITIVE Sensitive     PIP/TAZO <=4 SENSITIVE Sensitive     * >=100,000 COLONIES/mL ESCHERICHIA COLI  Resp Panel by RT-PCR (Flu A&B, Covid) Nasopharyngeal Swab     Status: None   Collection Time: 09/03/20  2:45 AM   Specimen: Nasopharyngeal Swab; Nasopharyngeal(NP) swabs in vial transport medium  Result Value Ref Range Status   SARS Coronavirus 2 by RT PCR NEGATIVE NEGATIVE Final    Comment: (NOTE) SARS-CoV-2 target nucleic acids are NOT DETECTED.  The SARS-CoV-2 RNA is generally detectable in upper respiratory specimens during the acute phase of infection. The lowest concentration of SARS-CoV-2 viral copies this assay can detect is 138 copies/mL. A negative result does not preclude SARS-Cov-2 infection and should not be used as the sole basis for treatment or other patient management decisions. A negative result may occur with  improper specimen collection/handling, submission of specimen other than nasopharyngeal swab, presence of viral mutation(s) within the areas targeted by this assay, and inadequate number of viral copies(<138 copies/mL). A negative result must be combined with clinical observations, patient history, and epidemiological information. The expected result is Negative.  Fact Sheet for Patients:  EntrepreneurPulse.com.au  Fact Sheet for Healthcare Providers:  IncredibleEmployment.be  This test is no t yet approved or cleared by the [place] FDA and  has been authorized for detection and/or diagnosis of SARS-CoV-2 by FDA under an Emergency Use Authorization (EUA). This EUA will remain  in effect (meaning this test can be used) for the duration of the COVID-19 declaration under Section 564(b)(1) of the Act, 21 U.S.C.section 360bbb-3(b)(1), unless the authorization is terminated  or revoked sooner.       Influenza A by PCR NEGATIVE NEGATIVE Final   Influenza B by PCR NEGATIVE NEGATIVE Final    Comment: (NOTE) The Xpert Xpress SARS-CoV-2/FLU/RSV plus assay is intended as an aid in the diagnosis of influenza from Nasopharyngeal swab specimens and should not be used as a sole basis for treatment. Nasal washings and aspirates are unacceptable for Xpert Xpress SARS-CoV-2/FLU/RSV testing.  Fact Sheet for Patients: EntrepreneurPulse.com.au  Fact Sheet for Healthcare Providers: IncredibleEmployment.be  This test is not yet approved or cleared by the [place] FDA and has been authorized for detection and/or diagnosis of SARS-CoV-2 by FDA under an Emergency Use Authorization (EUA). This EUA will remain in effect (meaning this test can be used) for the duration of the COVID-19 declaration under Section 564(b)(1) of the Act, 21 U.S.C. section 360bbb-3(b)(1), unless the authorization is terminated or revoked.  Performed at [facility], [address], [place], [place] [address]     RADIOLOGY:  No results found.   CODE STATUS:     Code Status Orders  (From admission, onward)         Start     Ordered   09/03/20 0431  Full code  Continuous        09/03/20 0433        Code Status History  Date Active Date Inactive Code Status Order ID Comments User Context   12/02/2017 1152 12/04/2017 1731 Full Code [id number]  Lovell Sheehan, MD Inpatient   01/25/2017 1402 01/28/2017 2028 Full Code [id number]  Hillary Bow, MD ED   01/17/2017 1444 01/18/2017 1754 Full Code  [id number]  Thornton Park, MD Inpatient   02/04/2015 1534 02/10/2015 2036 Full Code [id number]  Thornton Park, MD Inpatient   02/03/2015 1751 02/04/2015 1534 Full Code [id number]  Thornton Park, MD Inpatient   02/03/2015 1749 02/03/2015 1750 Full Code [id number]  Thornton Park, MD Inpatient   02/03/2015 1338 02/03/2015 1749 Full Code [id number]  Hillary Bow, MD ED   Advance Care Planning Activity    Advance Directive Documentation   Flowsheet Row Most Recent Value  Type of Advance Directive Healthcare Power of Attorney  Pre-existing out of facility DNR order (yellow form or pink MOST form) -  "MOST" Form in Place? -       TOTAL TIME TAKING CARE OF THIS PATIENT:35 minutes.    Fritzi Mandes M.D  [facility]    CC: Primary care physician; Gladstone Lighter, MD

## 2020-09-08 LAB — CULTURE, BLOOD (ROUTINE X 2)
Culture: NO GROWTH
Culture: NO GROWTH

## 2020-09-09 DIAGNOSIS — E119 Type 2 diabetes mellitus without complications: Secondary | ICD-10-CM | POA: Diagnosis not present

## 2020-09-09 DIAGNOSIS — I10 Essential (primary) hypertension: Secondary | ICD-10-CM | POA: Diagnosis not present

## 2020-09-09 DIAGNOSIS — Z7982 Long term (current) use of aspirin: Secondary | ICD-10-CM | POA: Diagnosis not present

## 2020-09-09 DIAGNOSIS — Z792 Long term (current) use of antibiotics: Secondary | ICD-10-CM | POA: Diagnosis not present

## 2020-09-09 DIAGNOSIS — Z7951 Long term (current) use of inhaled steroids: Secondary | ICD-10-CM | POA: Diagnosis not present

## 2020-09-09 DIAGNOSIS — Z7984 Long term (current) use of oral hypoglycemic drugs: Secondary | ICD-10-CM | POA: Diagnosis not present

## 2020-09-09 DIAGNOSIS — J189 Pneumonia, unspecified organism: Secondary | ICD-10-CM | POA: Diagnosis not present

## 2020-09-09 DIAGNOSIS — M810 Age-related osteoporosis without current pathological fracture: Secondary | ICD-10-CM | POA: Diagnosis not present

## 2020-09-09 DIAGNOSIS — J440 Chronic obstructive pulmonary disease with acute lower respiratory infection: Secondary | ICD-10-CM | POA: Diagnosis not present

## 2020-09-10 DIAGNOSIS — J189 Pneumonia, unspecified organism: Secondary | ICD-10-CM | POA: Diagnosis not present

## 2020-09-10 DIAGNOSIS — Z7951 Long term (current) use of inhaled steroids: Secondary | ICD-10-CM | POA: Diagnosis not present

## 2020-09-10 DIAGNOSIS — E119 Type 2 diabetes mellitus without complications: Secondary | ICD-10-CM | POA: Diagnosis not present

## 2020-09-10 DIAGNOSIS — I10 Essential (primary) hypertension: Secondary | ICD-10-CM | POA: Diagnosis not present

## 2020-09-10 DIAGNOSIS — Z7982 Long term (current) use of aspirin: Secondary | ICD-10-CM | POA: Diagnosis not present

## 2020-09-10 DIAGNOSIS — Z7984 Long term (current) use of oral hypoglycemic drugs: Secondary | ICD-10-CM | POA: Diagnosis not present

## 2020-09-10 DIAGNOSIS — M810 Age-related osteoporosis without current pathological fracture: Secondary | ICD-10-CM | POA: Diagnosis not present

## 2020-09-10 DIAGNOSIS — J440 Chronic obstructive pulmonary disease with acute lower respiratory infection: Secondary | ICD-10-CM | POA: Diagnosis not present

## 2020-09-10 DIAGNOSIS — Z792 Long term (current) use of antibiotics: Secondary | ICD-10-CM | POA: Diagnosis not present

## 2020-09-12 DIAGNOSIS — Z7984 Long term (current) use of oral hypoglycemic drugs: Secondary | ICD-10-CM | POA: Diagnosis not present

## 2020-09-12 DIAGNOSIS — I10 Essential (primary) hypertension: Secondary | ICD-10-CM | POA: Diagnosis not present

## 2020-09-12 DIAGNOSIS — Z7951 Long term (current) use of inhaled steroids: Secondary | ICD-10-CM | POA: Diagnosis not present

## 2020-09-12 DIAGNOSIS — E119 Type 2 diabetes mellitus without complications: Secondary | ICD-10-CM | POA: Diagnosis not present

## 2020-09-12 DIAGNOSIS — J189 Pneumonia, unspecified organism: Secondary | ICD-10-CM | POA: Diagnosis not present

## 2020-09-12 DIAGNOSIS — Z7982 Long term (current) use of aspirin: Secondary | ICD-10-CM | POA: Diagnosis not present

## 2020-09-12 DIAGNOSIS — M810 Age-related osteoporosis without current pathological fracture: Secondary | ICD-10-CM | POA: Diagnosis not present

## 2020-09-12 DIAGNOSIS — Z792 Long term (current) use of antibiotics: Secondary | ICD-10-CM | POA: Diagnosis not present

## 2020-09-12 DIAGNOSIS — J440 Chronic obstructive pulmonary disease with acute lower respiratory infection: Secondary | ICD-10-CM | POA: Diagnosis not present

## 2020-09-15 DIAGNOSIS — E1122 Type 2 diabetes mellitus with diabetic chronic kidney disease: Secondary | ICD-10-CM | POA: Diagnosis not present

## 2020-09-15 DIAGNOSIS — M25512 Pain in left shoulder: Secondary | ICD-10-CM | POA: Diagnosis not present

## 2020-09-15 DIAGNOSIS — I110 Hypertensive heart disease with heart failure: Secondary | ICD-10-CM | POA: Diagnosis not present

## 2020-09-15 DIAGNOSIS — G8929 Other chronic pain: Secondary | ICD-10-CM | POA: Diagnosis not present

## 2020-09-15 DIAGNOSIS — I5032 Chronic diastolic (congestive) heart failure: Secondary | ICD-10-CM | POA: Diagnosis not present

## 2020-09-15 DIAGNOSIS — M5030 Other cervical disc degeneration, unspecified cervical region: Secondary | ICD-10-CM | POA: Diagnosis not present

## 2020-09-15 DIAGNOSIS — I10 Essential (primary) hypertension: Secondary | ICD-10-CM | POA: Diagnosis not present

## 2020-09-15 DIAGNOSIS — G894 Chronic pain syndrome: Secondary | ICD-10-CM | POA: Diagnosis not present

## 2020-09-15 DIAGNOSIS — J189 Pneumonia, unspecified organism: Secondary | ICD-10-CM | POA: Diagnosis not present

## 2020-09-15 DIAGNOSIS — M62830 Muscle spasm of back: Secondary | ICD-10-CM | POA: Diagnosis not present

## 2020-09-15 DIAGNOSIS — M5136 Other intervertebral disc degeneration, lumbar region: Secondary | ICD-10-CM | POA: Diagnosis not present

## 2020-09-15 DIAGNOSIS — N183 Chronic kidney disease, stage 3 unspecified: Secondary | ICD-10-CM | POA: Diagnosis not present

## 2020-09-15 DIAGNOSIS — Z09 Encounter for follow-up examination after completed treatment for conditions other than malignant neoplasm: Secondary | ICD-10-CM | POA: Diagnosis not present

## 2020-09-16 DIAGNOSIS — J440 Chronic obstructive pulmonary disease with acute lower respiratory infection: Secondary | ICD-10-CM | POA: Diagnosis not present

## 2020-09-16 DIAGNOSIS — Z792 Long term (current) use of antibiotics: Secondary | ICD-10-CM | POA: Diagnosis not present

## 2020-09-16 DIAGNOSIS — Z7951 Long term (current) use of inhaled steroids: Secondary | ICD-10-CM | POA: Diagnosis not present

## 2020-09-16 DIAGNOSIS — E119 Type 2 diabetes mellitus without complications: Secondary | ICD-10-CM | POA: Diagnosis not present

## 2020-09-16 DIAGNOSIS — Z7984 Long term (current) use of oral hypoglycemic drugs: Secondary | ICD-10-CM | POA: Diagnosis not present

## 2020-09-16 DIAGNOSIS — M810 Age-related osteoporosis without current pathological fracture: Secondary | ICD-10-CM | POA: Diagnosis not present

## 2020-09-16 DIAGNOSIS — Z7982 Long term (current) use of aspirin: Secondary | ICD-10-CM | POA: Diagnosis not present

## 2020-09-16 DIAGNOSIS — J189 Pneumonia, unspecified organism: Secondary | ICD-10-CM | POA: Diagnosis not present

## 2020-09-16 DIAGNOSIS — I10 Essential (primary) hypertension: Secondary | ICD-10-CM | POA: Diagnosis not present

## 2020-10-04 DIAGNOSIS — Z7951 Long term (current) use of inhaled steroids: Secondary | ICD-10-CM | POA: Diagnosis not present

## 2020-10-04 DIAGNOSIS — J440 Chronic obstructive pulmonary disease with acute lower respiratory infection: Secondary | ICD-10-CM | POA: Diagnosis not present

## 2020-10-04 DIAGNOSIS — Z7982 Long term (current) use of aspirin: Secondary | ICD-10-CM | POA: Diagnosis not present

## 2020-10-04 DIAGNOSIS — Z7984 Long term (current) use of oral hypoglycemic drugs: Secondary | ICD-10-CM | POA: Diagnosis not present

## 2020-10-04 DIAGNOSIS — E119 Type 2 diabetes mellitus without complications: Secondary | ICD-10-CM | POA: Diagnosis not present

## 2020-10-04 DIAGNOSIS — M810 Age-related osteoporosis without current pathological fracture: Secondary | ICD-10-CM | POA: Diagnosis not present

## 2020-10-04 DIAGNOSIS — I10 Essential (primary) hypertension: Secondary | ICD-10-CM | POA: Diagnosis not present

## 2020-10-04 DIAGNOSIS — Z792 Long term (current) use of antibiotics: Secondary | ICD-10-CM | POA: Diagnosis not present

## 2020-10-04 DIAGNOSIS — J189 Pneumonia, unspecified organism: Secondary | ICD-10-CM | POA: Diagnosis not present

## 2020-10-31 DIAGNOSIS — R053 Chronic cough: Secondary | ICD-10-CM | POA: Diagnosis not present

## 2020-10-31 DIAGNOSIS — K449 Diaphragmatic hernia without obstruction or gangrene: Secondary | ICD-10-CM | POA: Diagnosis not present

## 2020-10-31 DIAGNOSIS — R059 Cough, unspecified: Secondary | ICD-10-CM | POA: Diagnosis not present

## 2020-10-31 DIAGNOSIS — J310 Chronic rhinitis: Secondary | ICD-10-CM | POA: Diagnosis not present

## 2020-10-31 DIAGNOSIS — J449 Chronic obstructive pulmonary disease, unspecified: Secondary | ICD-10-CM | POA: Diagnosis not present

## 2020-11-21 DIAGNOSIS — R509 Fever, unspecified: Secondary | ICD-10-CM | POA: Diagnosis not present

## 2020-11-21 DIAGNOSIS — R42 Dizziness and giddiness: Secondary | ICD-10-CM | POA: Diagnosis not present

## 2020-11-21 DIAGNOSIS — R110 Nausea: Secondary | ICD-10-CM | POA: Diagnosis not present

## 2020-11-22 ENCOUNTER — Other Ambulatory Visit: Payer: Self-pay | Admitting: Physician Assistant

## 2020-11-22 DIAGNOSIS — R110 Nausea: Secondary | ICD-10-CM

## 2020-11-22 DIAGNOSIS — R7989 Other specified abnormal findings of blood chemistry: Secondary | ICD-10-CM

## 2020-11-23 ENCOUNTER — Other Ambulatory Visit: Payer: Self-pay

## 2020-11-23 ENCOUNTER — Ambulatory Visit
Admission: RE | Admit: 2020-11-23 | Discharge: 2020-11-23 | Disposition: A | Discharge status: Home / Self Care | Payer: Medicare HMO | Source: Ambulatory Visit | Attending: Physician Assistant | Admitting: Physician Assistant

## 2020-11-23 DIAGNOSIS — R110 Nausea: Secondary | ICD-10-CM | POA: Insufficient documentation

## 2020-11-23 DIAGNOSIS — R7989 Other specified abnormal findings of blood chemistry: Secondary | ICD-10-CM | POA: Insufficient documentation

## 2020-11-23 DIAGNOSIS — N281 Cyst of kidney, acquired: Secondary | ICD-10-CM | POA: Diagnosis not present

## 2020-11-28 ENCOUNTER — Other Ambulatory Visit (HOSPITAL_COMMUNITY): Payer: Self-pay | Admitting: Internal Medicine

## 2020-11-28 ENCOUNTER — Other Ambulatory Visit: Payer: Self-pay | Admitting: Internal Medicine

## 2020-11-28 DIAGNOSIS — K838 Other specified diseases of biliary tract: Secondary | ICD-10-CM

## 2020-11-29 ENCOUNTER — Other Ambulatory Visit: Payer: Self-pay | Admitting: Internal Medicine

## 2020-12-04 ENCOUNTER — Ambulatory Visit
Admission: RE | Admit: 2020-12-04 | Discharge: 2020-12-04 | Disposition: A | Discharge status: Home / Self Care | Payer: Medicare HMO | Source: Ambulatory Visit | Attending: Internal Medicine | Admitting: Internal Medicine

## 2020-12-04 ENCOUNTER — Other Ambulatory Visit: Payer: Self-pay | Admitting: Internal Medicine

## 2020-12-04 ENCOUNTER — Other Ambulatory Visit: Payer: Self-pay

## 2020-12-04 DIAGNOSIS — N281 Cyst of kidney, acquired: Secondary | ICD-10-CM | POA: Diagnosis not present

## 2020-12-04 DIAGNOSIS — K838 Other specified diseases of biliary tract: Secondary | ICD-10-CM | POA: Insufficient documentation

## 2020-12-04 MED ORDER — GADOBUTROL 1 MMOL/ML IV SOLN
7.0000 mL | Freq: Once | INTRAVENOUS | Status: AC | PRN
  Administered 2020-12-04: 7 mL via INTRAVENOUS

## 2020-12-13 DIAGNOSIS — N179 Acute kidney failure, unspecified: Secondary | ICD-10-CM | POA: Diagnosis not present

## 2020-12-13 DIAGNOSIS — R1084 Generalized abdominal pain: Secondary | ICD-10-CM | POA: Diagnosis not present

## 2020-12-13 DIAGNOSIS — N183 Chronic kidney disease, stage 3 unspecified: Secondary | ICD-10-CM | POA: Diagnosis not present

## 2020-12-13 DIAGNOSIS — E1122 Type 2 diabetes mellitus with diabetic chronic kidney disease: Secondary | ICD-10-CM | POA: Diagnosis not present

## 2020-12-20 ENCOUNTER — Other Ambulatory Visit: Payer: Self-pay | Admitting: Gastroenterology

## 2020-12-20 DIAGNOSIS — R110 Nausea: Secondary | ICD-10-CM

## 2020-12-20 DIAGNOSIS — K7460 Unspecified cirrhosis of liver: Secondary | ICD-10-CM | POA: Diagnosis not present

## 2020-12-20 DIAGNOSIS — K5909 Other constipation: Secondary | ICD-10-CM | POA: Diagnosis not present

## 2020-12-20 DIAGNOSIS — K219 Gastro-esophageal reflux disease without esophagitis: Secondary | ICD-10-CM

## 2020-12-20 DIAGNOSIS — Z86010 Personal history of colonic polyps: Secondary | ICD-10-CM | POA: Diagnosis not present

## 2020-12-27 ENCOUNTER — Other Ambulatory Visit: Payer: Medicare HMO

## 2020-12-27 ENCOUNTER — Other Ambulatory Visit: Payer: Self-pay

## 2020-12-27 ENCOUNTER — Ambulatory Visit
Admission: RE | Admit: 2020-12-27 | Discharge: 2020-12-27 | Disposition: A | Discharge status: Home / Self Care | Payer: Medicare HMO | Source: Ambulatory Visit | Attending: Gastroenterology | Admitting: Gastroenterology

## 2020-12-27 DIAGNOSIS — K449 Diaphragmatic hernia without obstruction or gangrene: Secondary | ICD-10-CM | POA: Diagnosis not present

## 2020-12-27 DIAGNOSIS — K219 Gastro-esophageal reflux disease without esophagitis: Secondary | ICD-10-CM | POA: Diagnosis not present

## 2020-12-27 DIAGNOSIS — R1084 Generalized abdominal pain: Secondary | ICD-10-CM | POA: Diagnosis not present

## 2020-12-27 DIAGNOSIS — R110 Nausea: Secondary | ICD-10-CM | POA: Insufficient documentation

## 2021-01-25 ENCOUNTER — Other Ambulatory Visit: Payer: Self-pay

## 2021-01-25 ENCOUNTER — Ambulatory Visit
Admission: RE | Admit: 2021-01-25 | Discharge: 2021-01-25 | Disposition: A | Discharge status: Home / Self Care | Payer: Medicare HMO | Source: Ambulatory Visit | Attending: Gastroenterology | Admitting: Gastroenterology

## 2021-01-25 DIAGNOSIS — K7460 Unspecified cirrhosis of liver: Secondary | ICD-10-CM | POA: Diagnosis not present

## 2021-01-25 DIAGNOSIS — K219 Gastro-esophageal reflux disease without esophagitis: Secondary | ICD-10-CM

## 2021-01-25 DIAGNOSIS — K760 Fatty (change of) liver, not elsewhere classified: Secondary | ICD-10-CM | POA: Diagnosis not present

## 2021-01-31 DIAGNOSIS — Z96642 Presence of left artificial hip joint: Secondary | ICD-10-CM | POA: Diagnosis not present

## 2021-01-31 DIAGNOSIS — M24852 Other specific joint derangements of left hip, not elsewhere classified: Secondary | ICD-10-CM | POA: Diagnosis not present

## 2021-01-31 DIAGNOSIS — M79672 Pain in left foot: Secondary | ICD-10-CM | POA: Diagnosis not present

## 2021-01-31 DIAGNOSIS — M25562 Pain in left knee: Secondary | ICD-10-CM | POA: Diagnosis not present

## 2021-02-21 DIAGNOSIS — N189 Chronic kidney disease, unspecified: Secondary | ICD-10-CM | POA: Diagnosis not present

## 2021-02-21 DIAGNOSIS — Z8679 Personal history of other diseases of the circulatory system: Secondary | ICD-10-CM | POA: Diagnosis not present

## 2021-02-21 DIAGNOSIS — K745 Biliary cirrhosis, unspecified: Secondary | ICD-10-CM | POA: Diagnosis not present

## 2021-02-21 DIAGNOSIS — E1122 Type 2 diabetes mellitus with diabetic chronic kidney disease: Secondary | ICD-10-CM | POA: Diagnosis not present

## 2021-03-02 DIAGNOSIS — R0609 Other forms of dyspnea: Secondary | ICD-10-CM | POA: Diagnosis not present

## 2021-03-02 DIAGNOSIS — J4530 Mild persistent asthma, uncomplicated: Secondary | ICD-10-CM | POA: Diagnosis not present

## 2021-03-13 DIAGNOSIS — Z03818 Encounter for observation for suspected exposure to other biological agents ruled out: Secondary | ICD-10-CM | POA: Diagnosis not present

## 2021-03-13 DIAGNOSIS — R5383 Other fatigue: Secondary | ICD-10-CM | POA: Diagnosis not present

## 2021-03-13 DIAGNOSIS — R531 Weakness: Secondary | ICD-10-CM | POA: Diagnosis not present

## 2021-03-13 DIAGNOSIS — J40 Bronchitis, not specified as acute or chronic: Secondary | ICD-10-CM | POA: Diagnosis not present

## 2021-03-13 DIAGNOSIS — R051 Acute cough: Secondary | ICD-10-CM | POA: Diagnosis not present

## 2021-03-13 DIAGNOSIS — U071 COVID-19: Secondary | ICD-10-CM | POA: Diagnosis not present

## 2021-08-22 ENCOUNTER — Other Ambulatory Visit: Payer: Self-pay | Admitting: Gastroenterology

## 2021-08-22 ENCOUNTER — Other Ambulatory Visit (HOSPITAL_COMMUNITY): Payer: Self-pay | Admitting: Gastroenterology

## 2021-08-22 DIAGNOSIS — K219 Gastro-esophageal reflux disease without esophagitis: Secondary | ICD-10-CM | POA: Diagnosis not present

## 2021-08-22 DIAGNOSIS — K5909 Other constipation: Secondary | ICD-10-CM | POA: Diagnosis not present

## 2021-08-22 DIAGNOSIS — R110 Nausea: Secondary | ICD-10-CM | POA: Diagnosis not present

## 2021-08-22 DIAGNOSIS — K7460 Unspecified cirrhosis of liver: Secondary | ICD-10-CM | POA: Diagnosis not present

## 2021-08-22 DIAGNOSIS — K449 Diaphragmatic hernia without obstruction or gangrene: Secondary | ICD-10-CM | POA: Diagnosis not present

## 2021-08-25 DIAGNOSIS — M79672 Pain in left foot: Secondary | ICD-10-CM | POA: Diagnosis not present

## 2021-08-25 DIAGNOSIS — M24852 Other specific joint derangements of left hip, not elsewhere classified: Secondary | ICD-10-CM | POA: Diagnosis not present

## 2021-08-25 DIAGNOSIS — Z96642 Presence of left artificial hip joint: Secondary | ICD-10-CM | POA: Diagnosis not present

## 2021-09-14 DIAGNOSIS — M79672 Pain in left foot: Secondary | ICD-10-CM | POA: Diagnosis not present

## 2021-09-14 DIAGNOSIS — G609 Hereditary and idiopathic neuropathy, unspecified: Secondary | ICD-10-CM | POA: Diagnosis not present

## 2021-09-14 DIAGNOSIS — E119 Type 2 diabetes mellitus without complications: Secondary | ICD-10-CM | POA: Diagnosis not present

## 2021-09-19 ENCOUNTER — Other Ambulatory Visit: Payer: Medicare HMO

## 2021-09-28 ENCOUNTER — Ambulatory Visit
Admission: RE | Admit: 2021-09-28 | Discharge: 2021-09-28 | Disposition: A | Discharge status: Home / Self Care | Payer: Medicare HMO | Source: Ambulatory Visit | Attending: Gastroenterology | Admitting: Gastroenterology

## 2021-09-28 DIAGNOSIS — K7460 Unspecified cirrhosis of liver: Secondary | ICD-10-CM | POA: Insufficient documentation

## 2021-09-28 DIAGNOSIS — N281 Cyst of kidney, acquired: Secondary | ICD-10-CM | POA: Diagnosis not present

## 2021-09-28 DIAGNOSIS — Z9049 Acquired absence of other specified parts of digestive tract: Secondary | ICD-10-CM | POA: Diagnosis not present

## 2021-10-10 DIAGNOSIS — Z0000 Encounter for general adult medical examination without abnormal findings: Secondary | ICD-10-CM | POA: Diagnosis not present

## 2021-10-10 DIAGNOSIS — I10 Essential (primary) hypertension: Secondary | ICD-10-CM | POA: Diagnosis not present

## 2021-10-10 DIAGNOSIS — N183 Chronic kidney disease, stage 3 unspecified: Secondary | ICD-10-CM | POA: Diagnosis not present

## 2021-10-10 DIAGNOSIS — Z780 Asymptomatic menopausal state: Secondary | ICD-10-CM | POA: Diagnosis not present

## 2021-10-10 DIAGNOSIS — E1122 Type 2 diabetes mellitus with diabetic chronic kidney disease: Secondary | ICD-10-CM | POA: Diagnosis not present

## 2021-10-10 DIAGNOSIS — K745 Biliary cirrhosis, unspecified: Secondary | ICD-10-CM | POA: Diagnosis not present

## 2021-10-10 DIAGNOSIS — J441 Chronic obstructive pulmonary disease with (acute) exacerbation: Secondary | ICD-10-CM | POA: Diagnosis not present

## 2021-10-10 DIAGNOSIS — J4521 Mild intermittent asthma with (acute) exacerbation: Secondary | ICD-10-CM | POA: Diagnosis not present

## 2021-10-10 DIAGNOSIS — R202 Paresthesia of skin: Secondary | ICD-10-CM | POA: Diagnosis not present

## 2021-10-10 DIAGNOSIS — Z1389 Encounter for screening for other disorder: Secondary | ICD-10-CM | POA: Diagnosis not present

## 2021-10-26 DIAGNOSIS — M79672 Pain in left foot: Secondary | ICD-10-CM | POA: Diagnosis not present

## 2021-10-26 DIAGNOSIS — S9032XA Contusion of left foot, initial encounter: Secondary | ICD-10-CM | POA: Diagnosis not present

## 2021-12-25 DIAGNOSIS — E119 Type 2 diabetes mellitus without complications: Secondary | ICD-10-CM | POA: Diagnosis not present

## 2021-12-25 DIAGNOSIS — H35413 Lattice degeneration of retina, bilateral: Secondary | ICD-10-CM | POA: Diagnosis not present

## 2022-01-09 DIAGNOSIS — E1122 Type 2 diabetes mellitus with diabetic chronic kidney disease: Secondary | ICD-10-CM | POA: Diagnosis not present

## 2022-01-09 DIAGNOSIS — N189 Chronic kidney disease, unspecified: Secondary | ICD-10-CM | POA: Diagnosis not present

## 2022-01-09 DIAGNOSIS — R600 Localized edema: Secondary | ICD-10-CM | POA: Diagnosis not present

## 2022-01-09 DIAGNOSIS — J4530 Mild persistent asthma, uncomplicated: Secondary | ICD-10-CM | POA: Diagnosis not present

## 2022-01-09 DIAGNOSIS — R7989 Other specified abnormal findings of blood chemistry: Secondary | ICD-10-CM | POA: Diagnosis not present

## 2022-01-09 DIAGNOSIS — I129 Hypertensive chronic kidney disease with stage 1 through stage 4 chronic kidney disease, or unspecified chronic kidney disease: Secondary | ICD-10-CM | POA: Diagnosis not present

## 2022-01-24 DIAGNOSIS — Z96642 Presence of left artificial hip joint: Secondary | ICD-10-CM | POA: Diagnosis not present

## 2022-01-24 DIAGNOSIS — M25562 Pain in left knee: Secondary | ICD-10-CM | POA: Diagnosis not present

## 2022-01-24 DIAGNOSIS — M542 Cervicalgia: Secondary | ICD-10-CM | POA: Diagnosis not present

## 2022-01-24 DIAGNOSIS — M24852 Other specific joint derangements of left hip, not elsewhere classified: Secondary | ICD-10-CM | POA: Diagnosis not present

## 2022-01-29 DIAGNOSIS — M25552 Pain in left hip: Secondary | ICD-10-CM | POA: Diagnosis not present

## 2022-01-29 DIAGNOSIS — M542 Cervicalgia: Secondary | ICD-10-CM | POA: Diagnosis not present

## 2022-01-29 DIAGNOSIS — M25562 Pain in left knee: Secondary | ICD-10-CM | POA: Diagnosis not present

## 2022-02-02 DIAGNOSIS — M25552 Pain in left hip: Secondary | ICD-10-CM | POA: Diagnosis not present

## 2022-02-02 DIAGNOSIS — M25562 Pain in left knee: Secondary | ICD-10-CM | POA: Diagnosis not present

## 2022-02-02 DIAGNOSIS — M542 Cervicalgia: Secondary | ICD-10-CM | POA: Diagnosis not present

## 2022-02-06 DIAGNOSIS — M542 Cervicalgia: Secondary | ICD-10-CM | POA: Diagnosis not present

## 2022-02-06 DIAGNOSIS — M25552 Pain in left hip: Secondary | ICD-10-CM | POA: Diagnosis not present

## 2022-02-06 DIAGNOSIS — M25562 Pain in left knee: Secondary | ICD-10-CM | POA: Diagnosis not present

## 2022-02-12 DIAGNOSIS — M542 Cervicalgia: Secondary | ICD-10-CM | POA: Diagnosis not present

## 2022-02-12 DIAGNOSIS — M25562 Pain in left knee: Secondary | ICD-10-CM | POA: Diagnosis not present

## 2022-02-12 DIAGNOSIS — M25552 Pain in left hip: Secondary | ICD-10-CM | POA: Diagnosis not present

## 2022-02-14 DIAGNOSIS — M542 Cervicalgia: Secondary | ICD-10-CM | POA: Diagnosis not present

## 2022-02-14 DIAGNOSIS — M25562 Pain in left knee: Secondary | ICD-10-CM | POA: Diagnosis not present

## 2022-02-14 DIAGNOSIS — M25552 Pain in left hip: Secondary | ICD-10-CM | POA: Diagnosis not present

## 2022-02-19 DIAGNOSIS — K529 Noninfective gastroenteritis and colitis, unspecified: Secondary | ICD-10-CM | POA: Diagnosis not present

## 2022-02-19 DIAGNOSIS — Z9049 Acquired absence of other specified parts of digestive tract: Secondary | ICD-10-CM | POA: Diagnosis not present

## 2022-02-19 DIAGNOSIS — K449 Diaphragmatic hernia without obstruction or gangrene: Secondary | ICD-10-CM | POA: Diagnosis not present

## 2022-02-19 DIAGNOSIS — K7460 Unspecified cirrhosis of liver: Secondary | ICD-10-CM | POA: Diagnosis not present

## 2022-02-19 DIAGNOSIS — K219 Gastro-esophageal reflux disease without esophagitis: Secondary | ICD-10-CM | POA: Diagnosis not present

## 2022-02-21 DIAGNOSIS — M542 Cervicalgia: Secondary | ICD-10-CM | POA: Diagnosis not present

## 2022-02-21 DIAGNOSIS — M25552 Pain in left hip: Secondary | ICD-10-CM | POA: Diagnosis not present

## 2022-02-21 DIAGNOSIS — M25562 Pain in left knee: Secondary | ICD-10-CM | POA: Diagnosis not present

## 2022-02-28 DIAGNOSIS — M25562 Pain in left knee: Secondary | ICD-10-CM | POA: Diagnosis not present

## 2022-02-28 DIAGNOSIS — M542 Cervicalgia: Secondary | ICD-10-CM | POA: Diagnosis not present

## 2022-02-28 DIAGNOSIS — M25552 Pain in left hip: Secondary | ICD-10-CM | POA: Diagnosis not present

## 2022-03-02 DIAGNOSIS — M542 Cervicalgia: Secondary | ICD-10-CM | POA: Diagnosis not present

## 2022-03-02 DIAGNOSIS — M25562 Pain in left knee: Secondary | ICD-10-CM | POA: Diagnosis not present

## 2022-03-02 DIAGNOSIS — M25552 Pain in left hip: Secondary | ICD-10-CM | POA: Diagnosis not present

## 2022-03-13 DIAGNOSIS — M542 Cervicalgia: Secondary | ICD-10-CM | POA: Diagnosis not present

## 2022-03-13 DIAGNOSIS — M25562 Pain in left knee: Secondary | ICD-10-CM | POA: Diagnosis not present

## 2022-03-13 DIAGNOSIS — M25552 Pain in left hip: Secondary | ICD-10-CM | POA: Diagnosis not present

## 2022-03-16 DIAGNOSIS — M25552 Pain in left hip: Secondary | ICD-10-CM | POA: Diagnosis not present

## 2022-03-16 DIAGNOSIS — M25562 Pain in left knee: Secondary | ICD-10-CM | POA: Diagnosis not present

## 2022-03-16 DIAGNOSIS — M542 Cervicalgia: Secondary | ICD-10-CM | POA: Diagnosis not present

## 2022-03-20 DIAGNOSIS — M542 Cervicalgia: Secondary | ICD-10-CM | POA: Diagnosis not present

## 2022-03-20 DIAGNOSIS — M25552 Pain in left hip: Secondary | ICD-10-CM | POA: Diagnosis not present

## 2022-03-20 DIAGNOSIS — M25562 Pain in left knee: Secondary | ICD-10-CM | POA: Diagnosis not present

## 2022-03-22 DIAGNOSIS — M25552 Pain in left hip: Secondary | ICD-10-CM | POA: Diagnosis not present

## 2022-03-22 DIAGNOSIS — M25562 Pain in left knee: Secondary | ICD-10-CM | POA: Diagnosis not present

## 2022-03-22 DIAGNOSIS — M542 Cervicalgia: Secondary | ICD-10-CM | POA: Diagnosis not present

## 2022-03-28 DIAGNOSIS — M542 Cervicalgia: Secondary | ICD-10-CM | POA: Diagnosis not present

## 2022-04-25 DIAGNOSIS — R053 Chronic cough: Secondary | ICD-10-CM | POA: Diagnosis not present

## 2022-04-25 DIAGNOSIS — R062 Wheezing: Secondary | ICD-10-CM | POA: Diagnosis not present

## 2022-04-25 DIAGNOSIS — J439 Emphysema, unspecified: Secondary | ICD-10-CM | POA: Diagnosis not present

## 2022-06-05 ENCOUNTER — Other Ambulatory Visit: Payer: Self-pay

## 2022-06-05 ENCOUNTER — Emergency Department: Payer: Medicare HMO

## 2022-06-05 ENCOUNTER — Inpatient Hospital Stay
Admission: EM | Admit: 2022-06-05 | Discharge: 2022-06-07 | DRG: 309 | Disposition: A | Discharge status: Home Health | Payer: Medicare HMO | Attending: Internal Medicine | Admitting: Internal Medicine

## 2022-06-05 DIAGNOSIS — Z7951 Long term (current) use of inhaled steroids: Secondary | ICD-10-CM

## 2022-06-05 DIAGNOSIS — Z8249 Family history of ischemic heart disease and other diseases of the circulatory system: Secondary | ICD-10-CM

## 2022-06-05 DIAGNOSIS — Z7901 Long term (current) use of anticoagulants: Secondary | ICD-10-CM

## 2022-06-05 DIAGNOSIS — R55 Syncope and collapse: Secondary | ICD-10-CM | POA: Diagnosis not present

## 2022-06-05 DIAGNOSIS — E872 Acidosis, unspecified: Secondary | ICD-10-CM

## 2022-06-05 DIAGNOSIS — I2489 Other forms of acute ischemic heart disease: Secondary | ICD-10-CM | POA: Diagnosis not present

## 2022-06-05 DIAGNOSIS — Z7984 Long term (current) use of oral hypoglycemic drugs: Secondary | ICD-10-CM | POA: Diagnosis not present

## 2022-06-05 DIAGNOSIS — M1990 Unspecified osteoarthritis, unspecified site: Secondary | ICD-10-CM | POA: Diagnosis present

## 2022-06-05 DIAGNOSIS — N1832 Chronic kidney disease, stage 3b: Secondary | ICD-10-CM | POA: Diagnosis not present

## 2022-06-05 DIAGNOSIS — J4489 Other specified chronic obstructive pulmonary disease: Secondary | ICD-10-CM | POA: Diagnosis present

## 2022-06-05 DIAGNOSIS — J9811 Atelectasis: Secondary | ICD-10-CM | POA: Diagnosis not present

## 2022-06-05 DIAGNOSIS — Z96653 Presence of artificial knee joint, bilateral: Secondary | ICD-10-CM | POA: Diagnosis present

## 2022-06-05 DIAGNOSIS — R0689 Other abnormalities of breathing: Secondary | ICD-10-CM | POA: Diagnosis not present

## 2022-06-05 DIAGNOSIS — Z825 Family history of asthma and other chronic lower respiratory diseases: Secondary | ICD-10-CM | POA: Diagnosis not present

## 2022-06-05 DIAGNOSIS — I10 Essential (primary) hypertension: Secondary | ICD-10-CM | POA: Diagnosis present

## 2022-06-05 DIAGNOSIS — E860 Dehydration: Secondary | ICD-10-CM | POA: Diagnosis not present

## 2022-06-05 DIAGNOSIS — Z23 Encounter for immunization: Secondary | ICD-10-CM | POA: Diagnosis not present

## 2022-06-05 DIAGNOSIS — E119 Type 2 diabetes mellitus without complications: Secondary | ICD-10-CM

## 2022-06-05 DIAGNOSIS — R0902 Hypoxemia: Secondary | ICD-10-CM | POA: Diagnosis not present

## 2022-06-05 DIAGNOSIS — I4891 Unspecified atrial fibrillation: Principal | ICD-10-CM | POA: Diagnosis present

## 2022-06-05 DIAGNOSIS — R7989 Other specified abnormal findings of blood chemistry: Secondary | ICD-10-CM

## 2022-06-05 DIAGNOSIS — M8580 Other specified disorders of bone density and structure, unspecified site: Secondary | ICD-10-CM | POA: Diagnosis present

## 2022-06-05 DIAGNOSIS — E1122 Type 2 diabetes mellitus with diabetic chronic kidney disease: Secondary | ICD-10-CM | POA: Diagnosis present

## 2022-06-05 DIAGNOSIS — R0602 Shortness of breath: Secondary | ICD-10-CM | POA: Diagnosis not present

## 2022-06-05 DIAGNOSIS — J449 Chronic obstructive pulmonary disease, unspecified: Secondary | ICD-10-CM

## 2022-06-05 DIAGNOSIS — R000 Tachycardia, unspecified: Secondary | ICD-10-CM | POA: Diagnosis not present

## 2022-06-05 DIAGNOSIS — K869 Disease of pancreas, unspecified: Secondary | ICD-10-CM | POA: Diagnosis present

## 2022-06-05 DIAGNOSIS — Z96611 Presence of right artificial shoulder joint: Secondary | ICD-10-CM | POA: Diagnosis present

## 2022-06-05 DIAGNOSIS — Z96642 Presence of left artificial hip joint: Secondary | ICD-10-CM | POA: Diagnosis present

## 2022-06-05 DIAGNOSIS — I959 Hypotension, unspecified: Secondary | ICD-10-CM | POA: Diagnosis not present

## 2022-06-05 DIAGNOSIS — I129 Hypertensive chronic kidney disease with stage 1 through stage 4 chronic kidney disease, or unspecified chronic kidney disease: Secondary | ICD-10-CM | POA: Diagnosis present

## 2022-06-05 DIAGNOSIS — Z79899 Other long term (current) drug therapy: Secondary | ICD-10-CM

## 2022-06-05 DIAGNOSIS — K449 Diaphragmatic hernia without obstruction or gangrene: Secondary | ICD-10-CM | POA: Diagnosis not present

## 2022-06-05 DIAGNOSIS — R42 Dizziness and giddiness: Secondary | ICD-10-CM

## 2022-06-05 HISTORY — DX: Unspecified atrial fibrillation: I48.91

## 2022-06-05 LAB — COMPREHENSIVE METABOLIC PANEL
ALT: 15 U/L (ref 0–44)
AST: 24 U/L (ref 15–41)
Albumin: 3.3 g/dL — ABNORMAL LOW (ref 3.5–5.0)
Alkaline Phosphatase: 41 U/L (ref 38–126)
Anion gap: 8 (ref 5–15)
BUN: 36 mg/dL — ABNORMAL HIGH (ref 8–23)
CO2: 19 mmol/L — ABNORMAL LOW (ref 22–32)
Calcium: 8.2 mg/dL — ABNORMAL LOW (ref 8.9–10.3)
Chloride: 111 mmol/L (ref 98–111)
Creatinine, Ser: 1.66 mg/dL — ABNORMAL HIGH (ref 0.44–1.00)
GFR, Estimated: 30 mL/min — ABNORMAL LOW (ref >60)
Glucose, Bld: 151 mg/dL — ABNORMAL HIGH (ref 70–99)
Potassium: 3.6 mmol/L (ref 3.5–5.1)
Sodium: 138 mmol/L (ref 135–145)
Total Bilirubin: 0.9 mg/dL (ref 0.3–1.2)
Total Protein: 6 g/dL — ABNORMAL LOW (ref 6.5–8.1)

## 2022-06-05 LAB — T4, FREE: Free T4: 1.54 ng/dL — ABNORMAL HIGH (ref 0.61–1.12)

## 2022-06-05 LAB — D-DIMER, QUANTITATIVE: D-Dimer, Quant: 2.28 ug/mL-FEU — ABNORMAL HIGH (ref 0.00–0.50)

## 2022-06-05 LAB — CBG MONITORING, ED: Glucose-Capillary: 135 mg/dL — ABNORMAL HIGH (ref 70–99)

## 2022-06-05 LAB — TSH: TSH: 1.148 u[IU]/mL (ref 0.350–4.500)

## 2022-06-05 LAB — CBC WITH DIFFERENTIAL/PLATELET
Abs Immature Granulocytes: 0.03 10*3/uL (ref 0.00–0.07)
Basophils Absolute: 0.1 10*3/uL (ref 0.0–0.1)
Basophils Relative: 1 %
Eosinophils Absolute: 0.2 10*3/uL (ref 0.0–0.5)
Eosinophils Relative: 2 %
HCT: 36 % (ref 36.0–46.0)
Hemoglobin: 12.2 g/dL (ref 12.0–15.0)
Immature Granulocytes: 0 %
Lymphocytes Relative: 9 %
Lymphs Abs: 0.8 10*3/uL (ref 0.7–4.0)
MCH: 29.8 pg (ref 26.0–34.0)
MCHC: 33.9 g/dL (ref 30.0–36.0)
MCV: 87.8 fL (ref 80.0–100.0)
Monocytes Absolute: 0.4 10*3/uL (ref 0.1–1.0)
Monocytes Relative: 4 %
Neutro Abs: 7.6 10*3/uL (ref 1.7–7.7)
Neutrophils Relative %: 84 %
Platelets: 186 10*3/uL (ref 150–400)
RBC: 4.1 MIL/uL (ref 3.87–5.11)
RDW: 13.3 % (ref 11.5–15.5)
WBC: 9 10*3/uL (ref 4.0–10.5)
nRBC: 0 % (ref 0.0–0.2)

## 2022-06-05 LAB — MAGNESIUM: Magnesium: 1.7 mg/dL (ref 1.7–2.4)

## 2022-06-05 LAB — BRAIN NATRIURETIC PEPTIDE: B Natriuretic Peptide: 195.4 pg/mL — ABNORMAL HIGH (ref 0.0–100.0)

## 2022-06-05 LAB — TROPONIN I (HIGH SENSITIVITY)
Troponin I (High Sensitivity): 27 ng/L — ABNORMAL HIGH (ref <18)
Troponin I (High Sensitivity): 32 ng/L — ABNORMAL HIGH (ref <18)

## 2022-06-05 MED ORDER — DILTIAZEM HCL 60 MG PO TABS
30.0000 mg | ORAL_TABLET | Freq: Once | ORAL | Status: AC
  Administered 2022-06-05: 30 mg via ORAL
  Filled 2022-06-05: qty 1

## 2022-06-05 MED ORDER — ENOXAPARIN SODIUM 100 MG/ML IJ SOSY
1.0000 mg/kg | PREFILLED_SYRINGE | Freq: Once | INTRAMUSCULAR | Status: AC
  Administered 2022-06-05: 82.5 mg via SUBCUTANEOUS
  Filled 2022-06-05: qty 0.82

## 2022-06-05 MED ORDER — INSULIN ASPART 100 UNIT/ML IJ SOLN
0.0000 [IU] | Freq: Every day | INTRAMUSCULAR | Status: DC
  Administered 2022-06-06: 3 [IU] via SUBCUTANEOUS
  Filled 2022-06-05: qty 1

## 2022-06-05 MED ORDER — MAGNESIUM SULFATE 2 GM/50ML IV SOLN
2.0000 g | Freq: Once | INTRAVENOUS | Status: AC
  Administered 2022-06-05: 2 g via INTRAVENOUS
  Filled 2022-06-05: qty 50

## 2022-06-05 MED ORDER — ONDANSETRON HCL 4 MG/2ML IJ SOLN: 4.0000 mg | Freq: Four times a day (QID) | INTRAMUSCULAR | Status: DC | PRN

## 2022-06-05 MED ORDER — DILTIAZEM HCL 25 MG/5ML IV SOLN
10.0000 mg | Freq: Once | INTRAVENOUS | Status: AC
  Administered 2022-06-05: 10 mg via INTRAVENOUS
  Filled 2022-06-05: qty 5

## 2022-06-05 MED ORDER — INSULIN ASPART 100 UNIT/ML IJ SOLN
0.0000 [IU] | Freq: Three times a day (TID) | INTRAMUSCULAR | Status: DC
  Administered 2022-06-07: 2 [IU] via SUBCUTANEOUS
  Filled 2022-06-05: qty 1

## 2022-06-05 MED ORDER — ACETAMINOPHEN 325 MG PO TABS: 650.0000 mg | ORAL_TABLET | ORAL | Status: DC | PRN

## 2022-06-05 MED ORDER — DILTIAZEM HCL 60 MG PO TABS
30.0000 mg | ORAL_TABLET | Freq: Four times a day (QID) | ORAL | Status: DC
  Administered 2022-06-06 (×2): 30 mg via ORAL
  Filled 2022-06-05 (×2): qty 1

## 2022-06-05 MED ORDER — ENOXAPARIN SODIUM 40 MG/0.4ML IJ SOSY: 40.0000 mg | PREFILLED_SYRINGE | INTRAMUSCULAR | Status: DC

## 2022-06-05 MED ORDER — ASPIRIN 81 MG PO TBEC
81.0000 mg | DELAYED_RELEASE_TABLET | Freq: Every day | ORAL | Status: DC
  Administered 2022-06-05 – 2022-06-06 (×2): 81 mg via ORAL
  Filled 2022-06-05 (×2): qty 1

## 2022-06-05 MED ORDER — ENOXAPARIN SODIUM 30 MG/0.3ML IJ SOSY: 30.0000 mg | PREFILLED_SYRINGE | INTRAMUSCULAR | Status: DC

## 2022-06-05 MED ORDER — SODIUM CHLORIDE 0.9 % IV BOLUS
500.0000 mL | Freq: Once | INTRAVENOUS | Status: AC
  Administered 2022-06-05: 500 mL via INTRAVENOUS

## 2022-06-05 NOTE — ED Notes (Signed)
Pt tolerated IV dilt - HR slows to mid 90s. BP stable - monitors remain.

## 2022-06-05 NOTE — Assessment & Plan Note (Signed)
Likely secondary to rapid A-fib.  Patient denies chest pain and EKG nonacute Continue to trend

## 2022-06-05 NOTE — Progress Notes (Signed)
PHARMACIST - PHYSICIAN COMMUNICATION  CONCERNING:  Enoxaparin (Lovenox) for DVT Prophylaxis    RECOMMENDATION: Patient was prescribed enoxaprin '40mg'$  q24 hours for VTE prophylaxis.   Filed Weights   06/05/22 1708  Weight: 82.6 kg (182 lb)    Body mass index is 26.88 kg/m.  Estimated Creatinine Clearance: 27.4 mL/min (A) (by C-G formula based on SCr of 1.66 mg/dL (H)).   Patient is candidate for enoxaparin '30mg'$  every 24 hours based on CrCl <91m/min or Weight <45kg  DESCRIPTION: Pharmacy [facility]  Patient is now receiving enoxaparin 30 mg every 24 hours   Tristin Gladman Rodriguez-Guzman PharmD, BCPS 06/05/2022 8:46 PM

## 2022-06-05 NOTE — ED Notes (Signed)
ED Provider at bedside. 

## 2022-06-05 NOTE — ED Triage Notes (Signed)
BIBA from [place]. Pt had near syncopal episode while standing. Was caught and assisted down.   EMS found patient with rapid HR, EMS shows afib RVR. HR 100-130. Pt with SOB and some dizziness.   VSS upon arrival BG 163. EKG obtained. Pt alert and oriented x 4. No history of afib reported. Not anticoagulated.

## 2022-06-05 NOTE — H&P (Signed)
History and Physical    Patient: Leslie Parker DJM:[id number] DOB: 1932/07/31 DOA: 06/05/2022 DOS: the patient was seen and examined on 06/05/2022 PCP: Gladstone Lighter, MD  Patient coming from: Home  Chief Complaint:  Chief Complaint  Patient presents with   Tachycardia   Near Syncope    HPI: Leslie Parker is a 88 y.o. female with medical history significant for HTN, stage III COPD, followed by pulmonology, last seen 10/4, CKD 3B, diabetes, who presents to the ED by EMS following an near syncopal episode while at the [place].  She did not fall but was helped down.  The episode was preceded with dizziness and shortness of breath but no chest pain.  She was previously in her usual state of health but had been coughing and had 1 going fatigue for the past month.  She saw her pulmonologist on 10/4 when she complained of feeling easily exhausted, having a nagging cough and wheezing(per review of note).  EMS found her to be in A-fib with rate of 100-130s. ED course and data review: Heart rate in the ED as high as 134, with SBP ranging from 127-157.  Labs with normal CBC, CMP with creatinine 1.66 and bicarb of 19.  Troponin 32 and BNP 195 with D-dimer 2.28.  TSH normal at 1.148. EKG, personally viewed and interpreted shows A-fib at 127 1 with nonspecific ST-T wave changes. Chest x-ray shows chronic left lower lobe scarring or atelectasis and no acute airspace disease Patient received a dose of IV diltiazem 10 mg with improvement in rate to 91-108 and was subsequently given an oral dose of diltiazem 30 mg. CTA chest was ordered to rule out acute PE based on elevated D-dimer but due to creatinine of 1.66, V/Q subsequently ordered. Hospitalist consulted for admission.   Review of Systems: As mentioned in the history of present illness. All other systems reviewed and are negative.  Past Medical History:  Diagnosis Date   Asthma    Cancer ([place])    Basal Cell   Diabetes mellitus without  complication ([place])    Heart murmur    Hypertension    Impetigo    Osteoarthritis    Osteopenia    Vomiting    can not due to surgery   Wears dentures    full upper and lower   Past Surgical History:  Procedure Laterality Date   ABDOMINAL HYSTERECTOMY     BACK SURGERY     BLADDER SURGERY     mesh   CARPAL TUNNEL RELEASE Bilateral    CATARACT EXTRACTION Right 2017   CATARACT EXTRACTION W/PHACO Left 02/06/2016   Procedure: CATARACT EXTRACTION PHACO AND INTRAOCULAR LENS PLACEMENT ([place]) left eye;  Surgeon: Ronnell Freshwater, MD;  Location: [place] [facility];  Service: Ophthalmology;  Laterality: Left;  DIABETIC LEFT Cannot arrive before 9:30   CERVICAL DISC SURGERY     CHOLECYSTECTOMY     EYE SURGERY Bilateral    Cataract Extraction with IOL   FEMUR IM NAIL Left 02/04/2015   Procedure: INTRAMEDULLARY (IM) NAIL FEMORAL;  Surgeon: Thornton Park, MD;  Location: [facility];  Service: Orthopedics;  Laterality: Left;   HARDWARE REMOVAL Left 01/17/2017   Procedure: HARDWARE REMOVAL;  Surgeon: Thornton Park, MD;  Location: [facility];  Service: Orthopedics;  Laterality: Left;   HERNIA REPAIR  2014   esophageal and gastric mesh. patient unable to throw up d/t mesh   HIP ARTHROPLASTY Left 01/26/2017   Procedure: ARTHROPLASTY BIPOLAR HIP (HEMIARTHROPLASTY) removal hardware left hip;  Surgeon: Earnestine Leys, MD;  Location: [facility];  Service: Orthopedics;  Laterality: Left;   JOINT REPLACEMENT Bilateral    knees   REPLACEMENT TOTAL KNEE BILATERAL Bilateral 2007,2008   SHOULDER ARTHROSCOPY WITH OPEN ROTATOR CUFF REPAIR AND DISTAL CLAVICLE ACROMINECTOMY Left 10/25/2016   Procedure: SHOULDER ARTHROSCOPY WITH OPEN ROTATOR CUFF REPAIR AND DISTAL CLAVICLE ACROMINECTOMY;  Surgeon: Thornton Park, MD;  Location: [facility];  Service: Orthopedics;  Laterality: Left;   THYROID SURGERY     goiter removed   TOTAL HIP REVISION Left 12/02/2017   Procedure: TOTAL HIP REVISION;  Surgeon: Lovell Sheehan,  MD;  Location: [facility];  Service: Orthopedics;  Laterality: Left;   TOTAL SHOULDER REPLACEMENT Right 2012   Social History:  reports that she has never smoked. She has never used smokeless tobacco. She reports that she does not drink alcohol and does not use drugs.  No Known Allergies  Family History  Problem Relation Age of Onset   Heart failure Mother    Hypertension Mother    Emphysema Father     Prior to Admission medications   Medication Sig Start Date End Date Taking? Authorizing Provider  amLODipine (NORVASC) 10 MG tablet Take 10 mg by mouth daily. 01/05/14   [provider]  ascorbic acid (VITAMIN C) 250 MG CHEW Chew 250 mg by mouth daily.    [provider]  cholecalciferol (VITAMIN D3) 25 MCG (1000 UNIT) tablet Take 1,000 Units by mouth daily.    [provider]  Cyanocobalamin (B-12) 2500 MCG TABS Take 2,500 mcg by mouth daily.    [provider]  Fluticasone-Salmeterol (ADVAIR DISKUS) 500-50 MCG/DOSE AEPB Inhale 1 puff into the lungs daily.  10/05/14 11/20/18  [provider]  Fluticasone-Umeclidin-Vilant (TRELEGY ELLIPTA) 100-62.5-25 MCG/INH AEPB Inhale 1 puff into the lungs daily.    [provider]  Magnesium Oxide 500 MG CAPS Take 500 mg by mouth daily.    [provider]  metFORMIN (GLUCOPHAGE) 500 MG tablet Take 500 mg by mouth 2 (two) times daily.  06/23/14   [provider]  Multiple Vitamin (MULTIVITAMIN WITH MINERALS) TABS tablet Take 1 tablet by mouth daily. One-A-Day Women's Vitamin    [provider]  traZODone (DESYREL) 100 MG tablet Take 100 mg by mouth at bedtime.    [provider]    Physical Exam: Vitals:   06/05/22 1830 06/05/22 1840 06/05/22 1900 06/05/22 1935  BP: (!) 140/91 (!) 157/76 116/77 137/74  Pulse: (!) 120 (!) 108 91   Resp: 17 (!) 27 20   Temp:      TempSrc:      SpO2: 95% 94% 96%   Weight:      Height:       Physical Exam Vitals and nursing note  reviewed.  Constitutional:      General: She is not in acute distress. HENT:     Head: Normocephalic and atraumatic.  Cardiovascular:     Rate and Rhythm: Tachycardia present. Rhythm irregular.     Heart sounds: Normal heart sounds.  Pulmonary:     Effort: Tachypnea present.     Breath sounds: Normal breath sounds.  Abdominal:     Palpations: Abdomen is soft.     Tenderness: There is no abdominal tenderness.  Neurological:     Mental Status: Mental status is at baseline.     Labs on Admission: I have personally reviewed following labs and imaging studies  CBC: Recent Labs  Lab 06/05/22 1723  WBC  9.0  NEUTROABS 7.6  HGB 12.2  HCT 36.0  MCV 87.8  PLT 737   Basic Metabolic Panel: Recent Labs  Lab 06/05/22 1723  NA 138  K 3.6  CL 111  CO2 19*  GLUCOSE 151*  BUN 36*  CREATININE 1.66*  CALCIUM 8.2*  MG 1.7   GFR: Estimated Creatinine Clearance: 27.4 mL/min (A) (by C-G formula based on SCr of 1.66 mg/dL (H)). Liver Function Tests: Recent Labs  Lab 06/05/22 1723  AST 24  ALT 15  ALKPHOS 41  BILITOT 0.9  PROT 6.0*  ALBUMIN 3.3*   No results for input(s): "LIPASE", "AMYLASE" in the last 168 hours. No results for input(s): "AMMONIA" in the last 168 hours. Coagulation Profile: No results for input(s): "INR", "PROTIME" in the last 168 hours. Cardiac Enzymes: No results for input(s): "CKTOTAL", "CKMB", "CKMBINDEX", "TROPONINI" in the last 168 hours. BNP (last 3 results) No results for input(s): "PROBNP" in the last 8760 hours. HbA1C: No results for input(s): "HGBA1C" in the last 72 hours. CBG: No results for input(s): "GLUCAP" in the last 168 hours. Lipid Profile: No results for input(s): "CHOL", "HDL", "LDLCALC", "TRIG", "CHOLHDL", "LDLDIRECT" in the last 72 hours. Thyroid Function Tests: Recent Labs    06/05/22 1723  TSH 1.148  FREET4 1.54*   Anemia Panel: No results for input(s): "VITAMINB12", "FOLATE", "FERRITIN", "TIBC", "IRON", "RETICCTPCT" in  the last 72 hours. Urine analysis:    Component Value Date/Time   COLORURINE YELLOW (A) 09/03/2020 0243   APPEARANCEUR CLOUDY (A) 09/03/2020 0243   APPEARANCEUR Clear 02/19/2013 0254   LABSPEC 1.010 09/03/2020 0243   LABSPEC 1.006 02/19/2013 0254   PHURINE 7.0 09/03/2020 0243   GLUCOSEU NEGATIVE 09/03/2020 0243   GLUCOSEU Negative 02/19/2013 0254   HGBUR NEGATIVE 09/03/2020 0243   BILIRUBINUR NEGATIVE 09/03/2020 0243   BILIRUBINUR Negative 02/19/2013 0254   KETONESUR NEGATIVE 09/03/2020 0243   PROTEINUR 30 (A) 09/03/2020 0243   NITRITE NEGATIVE 09/03/2020 0243   LEUKOCYTESUR LARGE (A) 09/03/2020 0243   LEUKOCYTESUR 1+ 02/19/2013 0254    Radiological Exams on Admission: DG Chest Portable 1 View  Result Date: 06/05/2022 CLINICAL DATA:  Near syncope, short of breath, tachycardia EXAM: PORTABLE CHEST 1 VIEW COMPARISON:  09/03/2020 FINDINGS: Single frontal view of the chest demonstrates an enlarged cardiac silhouette. Stable ectasia and atherosclerosis of the thoracic aorta. No acute airspace disease, effusion, or pneumothorax. There is chronic atelectasis within the left lower lobe. Stable right shoulder arthroplasty. IMPRESSION: 1. Chronic left lower lobe scarring or atelectasis. 2. No acute airspace disease. Electronically Signed   By: Randa Ngo M.D.   On: 06/05/2022 18:01     Data Reviewed: Relevant notes from primary care and specialist visits, past discharge summaries as available in EHR, including Care Everywhere. Prior diagnostic testing as pertinent to current admission diagnoses Updated medications and problem lists for reconciliation ED course, including vitals, labs, imaging, treatment and response to treatment Triage notes, nursing and pharmacy notes and ED provider's notes Notable results as noted in HPI   Assessment and Plan: * Rapid atrial fibrillation, new onset(HCC) Achieved rapid rate control with a single bolus dose of diltiazem 10 mg We will continue oral  diltiazem 30 mg every 4 CHADS2 Vascor of 3 so will benefit from systemic anticoagulation for stroke prevention but will defer to cardiology  Continuous cardiac monitoring Echocardiogram cardiology consult   Postural dizziness with presyncope Likely secondary to rapid A-fib Some concern for PE given elevated D-dimer though this could be related to renal  function Creatinine precludes CTA so V/Q has been ordered We will give one-time weight-based dose of Lovenox for empiric PE treatment pending V/Q result Neurologic checks Fall precautions Dehydration  Elevated troponin Likely secondary to rapid A-fib.  Patient denies chest pain and EKG nonacute Continue to trend  Stage 3b chronic kidney disease ([place]) Metabolic acidosis  IV hydration Monitor renal function and avoid nephrotoxins  Chronic obstructive lung disease ([place]) Not acutely exacerbated Continue home inhalers.  DuoNebs as needed wheezing  Diabetes mellitus without complication (HCC) Sliding scale insulin Will hold glipizide and metformin  Hypertension Will hold amlodipine and furosemide as patient is currently on oral Cardizem.  Can reintroduce if necessary for BP control        DVT prophylaxis: Lovenox  Consults: Cardiology, [facility] Dr. Quentin Ore  Advance Care Planning:   Code Status: Prior   Family Communication: none  Disposition Plan: Back to previous home environment  Severity of Illness: The appropriate patient status for this patient is INPATIENT. Inpatient status is judged to be reasonable and necessary in order to provide the required intensity of service to ensure the patient's safety. The patient's presenting symptoms, physical exam findings, and initial radiographic and laboratory data in the context of their chronic comorbidities is felt to place them at high risk for further clinical deterioration. Furthermore, it is not anticipated that the patient will be medically stable for discharge from the  hospital within 2 midnights of admission.   * I certify that at the point of admission it is my clinical judgment that the patient will require inpatient hospital care spanning beyond 2 midnights from the point of admission due to high intensity of service, high risk for further deterioration and high frequency of surveillance required.*  Author: Athena Masse, MD 06/05/2022 8:13 PM  For on call review [email or web address].

## 2022-06-05 NOTE — Assessment & Plan Note (Signed)
Achieved rapid rate control with a single bolus dose of diltiazem 10 mg We will continue oral diltiazem 30 mg every 4 CHADS2 Vascor of 3 so will benefit from systemic anticoagulation for stroke prevention but will defer to cardiology  Continuous cardiac monitoring Echocardiogram cardiology consult

## 2022-06-05 NOTE — ED Provider Notes (Signed)
[facility] Provider Note    Event Date/Time   First MD Initiated Contact with Patient 06/05/22 1658     (approximate)   History   Tachycardia and Near Syncope   HPI  Leslie Parker is a 88 y.o. female with history of sepsis secondary to UTI, pneumonia, diabetes, asthma who came in for near syncopal episode while standing.  Patient reports that she has had a little bit of shortness of breath over the past few days.  She denies any chest pain or palpitations.  She reports that she nearly fell over and had weakness in her legs but she did not fall over or hit her head.  She denies any headaches.  She denies any abdominal pain.  She denies any prior history of atrial fibrillation.  Physical Exam   Triage Vital Signs: ED Triage Vitals  Enc Vitals Group     BP 06/05/22 1705 127/89     Pulse Rate 06/05/22 1705 78     Resp 06/05/22 1705 20     Temp --      Temp src --      SpO2 06/05/22 1705 98 %     Weight --      Height --      Head Circumference --      Peak Flow --      Pain Score 06/05/22 1706 0     Pain Loc --      Pain Edu? --      Excl. in [place]? --     Most recent vital signs: Vitals:   06/05/22 1705  BP: 127/89  Pulse: 78  Resp: 20  SpO2: 98%     General: Awake, no distress.  CV:  Good peripheral perfusion.  Irregular, tachycardic Resp:  Normal effort.  Abd:  No distention.  Other:  No swelling in legs.  No calf tenderness   ED Results / Procedures / Treatments   Labs (all labs ordered are listed, but only abnormal results are displayed) Labs Reviewed  COMPREHENSIVE METABOLIC PANEL - Abnormal; Notable for the following components:      Result Value   CO2 19 (*)    Glucose, Bld 151 (*)    BUN 36 (*)    Creatinine, Ser 1.66 (*)    Calcium 8.2 (*)    Total Protein 6.0 (*)    Albumin 3.3 (*)    GFR, Estimated 30 (*)    All other components within normal limits  T4, FREE - Abnormal; Notable for the following components:    Free T4 1.54 (*)    All other components within normal limits  D-DIMER, QUANTITATIVE - Abnormal; Notable for the following components:   D-Dimer, Quant 2.28 (*)    All other components within normal limits  BRAIN NATRIURETIC PEPTIDE - Abnormal; Notable for the following components:   B Natriuretic Peptide 195.4 (*)    All other components within normal limits  TROPONIN I (HIGH SENSITIVITY) - Abnormal; Notable for the following components:   Troponin I (High Sensitivity) 27 (*)    All other components within normal limits  CBC WITH DIFFERENTIAL/PLATELET  MAGNESIUM  TSH  TROPONIN I (HIGH SENSITIVITY)     EKG  My interpretation of EKG:  A-fib RVR with a rate of 127 without any ST elevation or T wave inversions, normal intervals  RADIOLOGY I have reviewed the xray personally and interpreted and chronic atelectasis left lower lobe  PROCEDURES:  Critical Care performed: No  .  1-3 Lead EKG Interpretation  Performed by: Vanessa [place], MD Authorized by: Vanessa [place], MD     Interpretation: abnormal     ECG rate:  130   ECG rate assessment: tachycardic     Rhythm: atrial fibrillation     Ectopy: none     Conduction: normal   .Critical Care  Performed by: Vanessa [place], MD Authorized by: Vanessa [place], MD   Critical care provider statement:    Critical care time (minutes):  30   Critical care was necessary to treat or prevent imminent or life-threatening deterioration of the following conditions:  Cardiac failure   Critical care was time spent personally by me on the following activities:  Development of treatment plan with patient or surrogate, discussions with consultants, evaluation of patient's response to treatment, examination of patient, ordering and review of laboratory studies, ordering and review of radiographic studies, ordering and performing treatments and interventions, pulse oximetry, re-evaluation of patient's condition and review of old charts    MEDICATIONS  ORDERED IN ED: Medications  diltiazem (CARDIZEM) injection 10 mg (10 mg Intravenous Given 06/05/22 1821)  diltiazem (CARDIZEM) tablet 30 mg (30 mg Oral Given 06/05/22 1935)     IMPRESSION / MDM / [place] / ED COURSE  I reviewed the triage vital signs and the nursing notes.   Patient's presentation is most consistent with acute, uncomplicated illness.   Patient comes in with A-fib with RVR patient given 10 of dill and heart rates have come down and maintain down so given some oral dill.  Labs are ordered evaluate for Electra abnormalities, AKI, thyroid dysfunction, PE, CHF.  BNP slightly elevated CBC shows stable hemoglobin CMP shows slightly elevated creatinine but similar to her priors troponin slightly elevated but similar to prior mag low we will give some IV mag  Given patient's near syncopal episode and an inability to get CT scan due to creatinine we will admit to the hospital team for echocardiogram, VQ scan and further monitoring.  The patient is on the cardiac monitor to evaluate for evidence of arrhythmia and/or significant heart rate changes.      FINAL CLINICAL IMPRESSION(S) / ED DIAGNOSES   Final diagnoses:  Atrial fibrillation with rapid ventricular response ([place])     Rx / DC Orders   ED Discharge Orders     None        Note:  This document was prepared using Dragon voice recognition software and may include unintentional dictation errors.   Vanessa [place], MD 06/05/22 [phone]

## 2022-06-05 NOTE — ED Notes (Signed)
Pt eating a meal tray.

## 2022-06-05 NOTE — Assessment & Plan Note (Addendum)
Likely secondary to rapid A-fib Some concern for PE given elevated D-dimer though this could be related to renal function Creatinine precludes CTA so V/Q has been ordered We will give one-time weight-based dose of Lovenox for empiric PE treatment pending V/Q result Neurologic checks Fall precautions Dehydration

## 2022-06-05 NOTE — Assessment & Plan Note (Addendum)
Will hold amlodipine and furosemide as patient is currently on oral Cardizem.  Can reintroduce if necessary for BP control

## 2022-06-05 NOTE — Assessment & Plan Note (Signed)
Not acutely exacerbated Continue home inhalers.  DuoNebs as needed wheezing

## 2022-06-05 NOTE — Assessment & Plan Note (Signed)
Sliding scale insulin Will hold glipizide and metformin

## 2022-06-05 NOTE — Assessment & Plan Note (Signed)
Metabolic acidosis  IV hydration Monitor renal function and avoid nephrotoxins

## 2022-06-06 ENCOUNTER — Inpatient Hospital Stay (HOSPITAL_COMMUNITY)
Admit: 2022-06-06 | Discharge: 2022-06-06 | Disposition: A | Discharge status: Home / Self Care | Payer: Medicare HMO | Attending: Internal Medicine | Admitting: Internal Medicine

## 2022-06-06 ENCOUNTER — Inpatient Hospital Stay: Payer: Medicare HMO

## 2022-06-06 ENCOUNTER — Encounter: Payer: Self-pay | Admitting: Internal Medicine

## 2022-06-06 DIAGNOSIS — R55 Syncope and collapse: Secondary | ICD-10-CM | POA: Diagnosis not present

## 2022-06-06 DIAGNOSIS — I4891 Unspecified atrial fibrillation: Secondary | ICD-10-CM | POA: Diagnosis not present

## 2022-06-06 LAB — BASIC METABOLIC PANEL
Anion gap: 6 (ref 5–15)
Anion gap: 8 (ref 5–15)
BUN: 38 mg/dL — ABNORMAL HIGH (ref 8–23)
BUN: 30 mg/dL — ABNORMAL HIGH (ref 8–23)
CO2: 22 mmol/L (ref 22–32)
CO2: 21 mmol/L — ABNORMAL LOW (ref 22–32)
Calcium: 8.6 mg/dL — ABNORMAL LOW (ref 8.9–10.3)
Calcium: 8.9 mg/dL (ref 8.9–10.3)
Chloride: 113 mmol/L — ABNORMAL HIGH (ref 98–111)
Chloride: 113 mmol/L — ABNORMAL HIGH (ref 98–111)
Creatinine, Ser: 1.76 mg/dL — ABNORMAL HIGH (ref 0.44–1.00)
Creatinine, Ser: 1.52 mg/dL — ABNORMAL HIGH (ref 0.44–1.00)
GFR, Estimated: 28 mL/min — ABNORMAL LOW (ref >60)
GFR, Estimated: 33 mL/min — ABNORMAL LOW (ref >60)
Glucose, Bld: 127 mg/dL — ABNORMAL HIGH (ref 70–99)
Glucose, Bld: 122 mg/dL — ABNORMAL HIGH (ref 70–99)
Potassium: 3.5 mmol/L (ref 3.5–5.1)
Potassium: 3.8 mmol/L (ref 3.5–5.1)
Sodium: 141 mmol/L (ref 135–145)
Sodium: 142 mmol/L (ref 135–145)

## 2022-06-06 LAB — ECHOCARDIOGRAM COMPLETE
AR max vel: 1.45 cm2
AV Area VTI: 1.31 cm2
AV Area mean vel: 1.54 cm2
AV Mean grad: 11.5 mmHg
AV Peak grad: 20.8 mmHg
Ao pk vel: 2.28 m/s
Area-P 1/2: 3.81 cm2
Height: 69 in
S' Lateral: 2.6 cm
Weight: 2912 oz

## 2022-06-06 LAB — HEMOGLOBIN A1C
Hgb A1c MFr Bld: 6.5 % — ABNORMAL HIGH (ref 4.8–5.6)
Mean Plasma Glucose: 139.85 mg/dL

## 2022-06-06 LAB — GLUCOSE, CAPILLARY: Glucose-Capillary: 286 mg/dL — ABNORMAL HIGH (ref 70–99)

## 2022-06-06 LAB — CBG MONITORING, ED: Glucose-Capillary: 110 mg/dL — ABNORMAL HIGH (ref 70–99)

## 2022-06-06 MED ORDER — TECHNETIUM TO 99M ALBUMIN AGGREGATED
4.0000 | Freq: Once | INTRAVENOUS | Status: AC | PRN
  Administered 2022-06-06: 4.19 via INTRAVENOUS

## 2022-06-06 MED ORDER — DILTIAZEM HCL ER COATED BEADS 180 MG PO CP24
180.0000 mg | ORAL_CAPSULE | Freq: Every day | ORAL | Status: DC
  Administered 2022-06-06 – 2022-06-07 (×2): 180 mg via ORAL
  Filled 2022-06-06 (×2): qty 1

## 2022-06-06 MED ORDER — PANTOPRAZOLE SODIUM 40 MG PO TBEC
40.0000 mg | DELAYED_RELEASE_TABLET | Freq: Every day | ORAL | Status: DC
  Administered 2022-06-06 – 2022-06-07 (×2): 40 mg via ORAL
  Filled 2022-06-06 (×2): qty 1

## 2022-06-06 MED ORDER — ENOXAPARIN SODIUM 100 MG/ML IJ SOSY
1.0000 mg/kg | PREFILLED_SYRINGE | Freq: Two times a day (BID) | INTRAMUSCULAR | Status: DC
  Filled 2022-06-06: qty 0.82

## 2022-06-06 MED ORDER — IOHEXOL 350 MG/ML SOLN
60.0000 mL | Freq: Once | INTRAVENOUS | Status: AC | PRN
  Administered 2022-06-06: 60 mL via INTRAVENOUS

## 2022-06-06 MED ORDER — ALBUTEROL SULFATE (2.5 MG/3ML) 0.083% IN NEBU: 3.0000 mL | INHALATION_SOLUTION | Freq: Four times a day (QID) | RESPIRATORY_TRACT | Status: DC | PRN

## 2022-06-06 MED ORDER — SODIUM CHLORIDE 0.9 % IV BOLUS
500.0000 mL | Freq: Once | INTRAVENOUS | Status: AC
  Administered 2022-06-06: 500 mL via INTRAVENOUS

## 2022-06-06 MED ORDER — COLESTIPOL HCL 1 G PO TABS
1.0000 g | ORAL_TABLET | Freq: Two times a day (BID) | ORAL | Status: DC
  Filled 2022-06-06 (×3): qty 1

## 2022-06-06 MED ORDER — SODIUM CHLORIDE 0.9 % IV SOLN: INTRAVENOUS | Status: DC

## 2022-06-06 MED ORDER — PNEUMOCOCCAL 20-VAL CONJ VACC 0.5 ML IM SUSY
0.5000 mL | PREFILLED_SYRINGE | INTRAMUSCULAR | Status: AC
  Administered 2022-06-07: 0.5 mL via INTRAMUSCULAR
  Filled 2022-06-06 (×2): qty 0.5

## 2022-06-06 MED ORDER — MOMETASONE FURO-FORMOTEROL FUM 200-5 MCG/ACT IN AERO
2.0000 | INHALATION_SPRAY | Freq: Two times a day (BID) | RESPIRATORY_TRACT | Status: DC
  Administered 2022-06-06 – 2022-06-07 (×2): 2 via RESPIRATORY_TRACT
  Filled 2022-06-06: qty 8.8

## 2022-06-06 MED ORDER — APIXABAN 2.5 MG PO TABS
2.5000 mg | ORAL_TABLET | Freq: Two times a day (BID) | ORAL | Status: DC
  Administered 2022-06-06 – 2022-06-07 (×3): 2.5 mg via ORAL
  Filled 2022-06-06 (×3): qty 1

## 2022-06-06 MED ORDER — TRAZODONE HCL 100 MG PO TABS
100.0000 mg | ORAL_TABLET | Freq: Every day | ORAL | Status: DC
  Administered 2022-06-06: 100 mg via ORAL
  Filled 2022-06-06: qty 1

## 2022-06-06 MED ORDER — INFLUENZA VAC A&B SA ADJ QUAD 0.5 ML IM PRSY
0.5000 mL | PREFILLED_SYRINGE | INTRAMUSCULAR | Status: AC
  Administered 2022-06-07: 0.5 mL via INTRAMUSCULAR
  Filled 2022-06-06: qty 0.5

## 2022-06-06 NOTE — Progress Notes (Signed)
*  PRELIMINARY RESULTS* Echocardiogram 2D Echocardiogram has been performed.  Leslie Parker [name] 06/06/2022, 7:58 AM

## 2022-06-06 NOTE — Progress Notes (Signed)
OT Cancellation Note  Patient Details Name: Leslie Parker MRN: [id number] DOB: [birth year]   Cancelled Treatment:    Reason Eval/Treat Not Completed: Other (comment) (pt waiting on CT for PE work up. OT will hold until work up has been completed and follow up as able.Shanon Payor, OTD OTR/L  06/06/22, 10:24 AM

## 2022-06-06 NOTE — Progress Notes (Signed)
PROGRESS NOTE    Leslie Parker  RFF:[id number] DOB: [birth year] DOA: 06/05/2022 PCP: Gladstone Lighter, MD    Brief Narrative:  88 y.o. female with medical history significant for HTN, stage III COPD, followed by pulmonology, last seen 10/4, CKD 3B, diabetes, who presents to the ED by EMS following an near syncopal episode while at the [place].  She did not fall but was helped down.  The episode was preceded with dizziness and shortness of breath but no chest pain.  She was previously in her usual state of health but had been coughing and had 1 going fatigue for the past month.  She saw her pulmonologist on 10/4 when she complained of feeling easily exhausted, having a nagging cough and wheezing(per review of note).  EMS found her to be in A-fib with rate of 100-130s.   VQ scan with intermediate probability of PE Followup CTA ordered, however will need to ensure adequate renal perfusion prior to study Seen by cardiology , recs appreciated  Assessment & Plan:   Principal Problem:   Rapid atrial fibrillation, new onset(HCC) Active Problems:   Postural dizziness with presyncope   Stage 3b chronic kidney disease (HCC)   Elevated troponin   Hypertension   Diabetes mellitus without complication (HCC)   Chronic obstructive lung disease (HCC)   Metabolic acidosis   Atrial fibrillation with rapid ventricular response (HCC)   Rapid atrial fibrillation, new onset(HCC) Achieved rapid rate control with a single bolus dose of diltiazem 10 mg Echo normal Plan: Cardizem CD 180daily Eliquis 2.5 BID Telemetry    Postural dizziness with presyncope Likely secondary to rapid A-fib Unable to exclude PE VQ scan intermediate Plan: Continue eliquis as above Hydrate Recheck BMP.  If GFR>30 can proceed with CTA thorax Hold therapy evals for now  Elevated troponin Likely secondary to rapid A-fib.  Patient denies chest pain and EKG nonacute    Stage 3b chronic kidney disease ([place]) Metabolic  acidosis  Continue IVF   Chronic obstructive lung disease ([place]) Not acutely exacerbated Continue home inhalers.  DuoNebs as needed wheezing   Diabetes mellitus without complication (HCC) Sliding scale insulin Will hold glipizide and metformin   Hypertension Will hold amlodipine and furosemide as patient is currently on oral Cardizem.   Can reintroduce if necessary for BP control   DVT prophylaxis: Eliquis Code Status: FULL Family Communication:None today Disposition Plan: Status is: Inpatient Remains inpatient appropriate because: rapid afib.  Possible PE   Level of care: Telemetry Cardiac  Consultants:  Cardiology- [facility]  Procedures:  None  Antimicrobials: None   Subjective: Seen and examined.  Resting comfortably in bed.  NAD  Objective: Vitals:   06/06/22 0530 06/06/22 0600 06/06/22 0603 06/06/22 0700  BP: (!) 169/83 (!) 166/82  (!) 148/57  Pulse: 83 84  69  Resp:  16  15  Temp:   97.7 F (36.5 C)   TempSrc:   Axillary   SpO2: 96% 98%  93%  Weight:      Height:        Intake/Output Summary (Last 24 hours) at 06/06/2022 1556 Last data filed at 06/06/2022 0300 Gross per 24 hour  Intake 545.93 ml  Output --  Net 545.93 ml   Filed Weights   06/05/22 1708  Weight: 82.6 kg    Examination:  General exam: Appears calm and comfortable  Respiratory system: Clear to auscultation. Respiratory effort normal. Cardiovascular system: S1S2, reg rate, irreg rhythm, no murmur Gastrointestinal system: Soft, NTND, normal BS Central nervous  system: Alert and oriented. No focal neurological deficits. Extremities: Symmetric 5 x 5 power. Skin: No rashes, lesions or ulcers Psychiatry: Judgement and insight appear normal. Mood & affect appropriate.     Data Reviewed: I have personally reviewed following labs and imaging studies  CBC: Recent Labs  Lab 06/05/22 1723  WBC 9.0  NEUTROABS 7.6  HGB 12.2  HCT 36.0  MCV 87.8  PLT 509   Basic Metabolic  Panel: Recent Labs  Lab 06/05/22 1723 06/06/22 0456  NA 138 141  K 3.6 3.5  CL 111 113*  CO2 19* 22  GLUCOSE 151* 127*  BUN 36* 38*  CREATININE 1.66* 1.76*  CALCIUM 8.2* 8.6*  MG 1.7  --    GFR: Estimated Creatinine Clearance: 25.9 mL/min (A) (by C-G formula based on SCr of 1.76 mg/dL (H)). Liver Function Tests: Recent Labs  Lab 06/05/22 1723  AST 24  ALT 15  ALKPHOS 41  BILITOT 0.9  PROT 6.0*  ALBUMIN 3.3*   No results for input(s): "LIPASE", "AMYLASE" in the last 168 hours. No results for input(s): "AMMONIA" in the last 168 hours. Coagulation Profile: No results for input(s): "INR", "PROTIME" in the last 168 hours. Cardiac Enzymes: No results for input(s): "CKTOTAL", "CKMB", "CKMBINDEX", "TROPONINI" in the last 168 hours. BNP (last 3 results) No results for input(s): "PROBNP" in the last 8760 hours. HbA1C: Recent Labs    06/06/22 0456  HGBA1C 6.5*   CBG: Recent Labs  Lab 06/05/22 2137 06/06/22 0839  GLUCAP 135* 110*   Lipid Profile: No results for input(s): "CHOL", "HDL", "LDLCALC", "TRIG", "CHOLHDL", "LDLDIRECT" in the last 72 hours. Thyroid Function Tests: Recent Labs    06/05/22 1723  TSH 1.148  FREET4 1.54*   Anemia Panel: No results for input(s): "VITAMINB12", "FOLATE", "FERRITIN", "TIBC", "IRON", "RETICCTPCT" in the last 72 hours. Sepsis Labs: No results for input(s): "PROCALCITON", "LATICACIDVEN" in the last 168 hours.  No results found for this or any previous visit (from the past 240 hour(s)).       Radiology Studies: ECHOCARDIOGRAM COMPLETE  Result Date: 06/06/2022    ECHOCARDIOGRAM REPORT   Patient Name:   Leslie Parker Date of Exam: 06/06/2022 Medical Rec #:  [id number]     Height:       69.0 in Accession #:    [id number]    Weight:       182.0 lb Date of Birth:  01-07-1933      BSA:          1.985 m Patient Age:    86 years      BP:           148/57 mmHg Patient Gender: F             HR:           74 bpm. Exam Location:  [facility]  Procedure: 2D Echo, Color Doppler and Cardiac Doppler Indications:     R55 Syncope  History:         Patient has no prior history of Echocardiogram examinations.                  COPD, Signs/Symptoms:Shortness of Breath,                  Dizziness/Lightheadedness and Fatigue; Risk                  Factors:Hypertension and Diabetes.  Sonographer:     Charmayne Sheer Referring Phys:  [id number] Athena Masse  Diagnosing Phys: Kate Sable MD IMPRESSIONS  1. Left ventricular ejection fraction, by estimation, is 60 to 65%. The left ventricle has normal function. The left ventricle has no regional wall motion abnormalities. There is mild left ventricular hypertrophy. Left ventricular diastolic parameters are indeterminate.  2. Right ventricular systolic function is normal. The right ventricular size is normal.  3. Left atrial size was mildly dilated.  4. The mitral valve is degenerative. Mild mitral valve regurgitation.  5. Tricuspid valve regurgitation is mild to moderate.  6. The aortic valve is calcified. Aortic valve regurgitation is not visualized. Mild aortic valve stenosis. Aortic valve mean gradient measures 11.5 mmHg.  7. The inferior vena cava is normal in size with greater than 50% respiratory variability, suggesting right atrial pressure of 3 mmHg. FINDINGS  Left Ventricle: Left ventricular ejection fraction, by estimation, is 60 to 65%. The left ventricle has normal function. The left ventricle has no regional wall motion abnormalities. The left ventricular internal cavity size was normal in size. There is  mild left ventricular hypertrophy. Left ventricular diastolic parameters are indeterminate. Right Ventricle: The right ventricular size is normal. No increase in right ventricular wall thickness. Right ventricular systolic function is normal. Left Atrium: Left atrial size was mildly dilated. Right Atrium: Right atrial size was normal in size. Pericardium: There is no evidence of pericardial effusion. Mitral  Valve: The mitral valve is degenerative in appearance. There is mild thickening of the mitral valve leaflet(s). Mild mitral annular calcification. Mild mitral valve regurgitation. Tricuspid Valve: The tricuspid valve is grossly normal. Tricuspid valve regurgitation is mild to moderate. Aortic Valve: The aortic valve is calcified. Aortic valve regurgitation is not visualized. Mild aortic stenosis is present. Aortic valve mean gradient measures 11.5 mmHg. Aortic valve peak gradient measures 20.8 mmHg. Aortic valve area, by VTI measures 1.31 cm. Pulmonic Valve: The pulmonic valve was not well visualized. Pulmonic valve regurgitation is not visualized. Aorta: The aortic root and ascending aorta are structurally normal, with no evidence of dilitation. Venous: The inferior vena cava is normal in size with greater than 50% respiratory variability, suggesting right atrial pressure of 3 mmHg. IAS/Shunts: No atrial level shunt detected by color flow Doppler.  LEFT VENTRICLE PLAX 2D LVIDd:         4.80 cm LVIDs:         2.60 cm LV PW:         1.10 cm LV IVS:        1.00 cm LVOT diam:     2.00 cm LV SV:         66 LV SV Index:   33 LVOT Area:     3.14 cm  RIGHT VENTRICLE RV Basal diam:  4.00 cm RV S prime:     18.80 cm/s LEFT ATRIUM             Index        RIGHT ATRIUM           Index LA diam:        4.30 cm 2.17 cm/m   RA Area:     12.60 cm LA Vol (A2C):   59.8 ml 30.13 ml/m  RA Volume:   33.90 ml  17.08 ml/m LA Vol (A4C):   56.7 ml 28.57 ml/m LA Biplane Vol: 59.3 ml 29.88 ml/m  AORTIC VALVE                     PULMONIC VALVE AV Area (Vmax):  1.45 cm      PV Vmax:       1.00 m/s AV Area (Vmean):   1.54 cm      PV Vmean:      71.400 cm/s AV Area (VTI):     1.31 cm      PV VTI:        0.162 m AV Vmax:           228.00 cm/s   PV Peak grad:  4.0 mmHg AV Vmean:          158.000 cm/s  PV Mean grad:  2.0 mmHg AV VTI:            0.506 m AV Peak Grad:      20.8 mmHg AV Mean Grad:      11.5 mmHg LVOT Vmax:         105.00  cm/s LVOT Vmean:        77.200 cm/s LVOT VTI:          0.211 m LVOT/AV VTI ratio: 0.42  AORTA Ao Root diam: 3.40 cm MITRAL VALVE MV Area (PHT): 3.81 cm     SHUNTS MV Decel Time: 199 msec     Systemic VTI:  0.21 m MV E velocity: 91.87 cm/s   Systemic Diam: 2.00 cm MV A velocity: 139.67 cm/s MV E/A ratio:  0.66 Kate Sable MD Electronically signed by Kate Sable MD Signature Date/Time: 06/06/2022/9:49:35 AM    Final (Updated)    NM Pulmonary Perfusion  Result Date: 06/06/2022 CLINICAL DATA:  Elevated D-dimer concern for pulmonary embolus. EXAM: NUCLEAR MEDICINE PERFUSION LUNG SCAN TECHNIQUE: Perfusion images were obtained in multiple projections after intravenous injection of radiopharmaceutical. Ventilation scans intentionally deferred if perfusion scan and chest x-ray adequate for interpretation during COVID 19 epidemic. RADIOPHARMACEUTICALS:  4.2 mCi Tc-61mMAA IV COMPARISON:  Chest radiograph June 05, 2022 and CT chest September 03, 2020. FINDINGS: On frontal and posterior views there is symmetric bilateral radiotracer distribution with findings compatible with cardiomegaly without wedge-shaped perfusion defects. A lateral and oblique views there are few small to moderate wedge-shaped perfusion defects. Additionally there is a linear perfusion defect seen on these views which likely reflects artifact from the right shoulder arthroplasty. IMPRESSION: Scintigraphic findings compatible with intermediate probability of pulmonary embolus. Consider further evaluation with CTA chest if clinically indicated. Electronically Signed   By: JDahlia BailiffM.D.   On: 06/06/2022 09:05   DG Chest Portable 1 View  Result Date: 06/05/2022 CLINICAL DATA:  Near syncope, short of breath, tachycardia EXAM: PORTABLE CHEST 1 VIEW COMPARISON:  09/03/2020 FINDINGS: Single frontal view of the chest demonstrates an enlarged cardiac silhouette. Stable ectasia and atherosclerosis of the thoracic aorta. No acute  airspace disease, effusion, or pneumothorax. There is chronic atelectasis within the left lower lobe. Stable right shoulder arthroplasty. IMPRESSION: 1. Chronic left lower lobe scarring or atelectasis. 2. No acute airspace disease. Electronically Signed   By: MRanda NgoM.D.   On: 06/05/2022 18:01        Scheduled Meds:  apixaban  2.5 mg Oral BID   colestipol  1 g Oral BID   diltiazem  180 mg Oral Daily   insulin aspart  0-15 Units Subcutaneous TID WC   insulin aspart  0-5 Units Subcutaneous QHS   mometasone-formoterol  2 puff Inhalation BID   pantoprazole  40 mg Oral Daily   traZODone  100 mg Oral QHS   Continuous Infusions:  sodium chloride 75 mL/hr at 06/06/22 1234  LOS: 1 day    Sidney Ace, MD [facility]   If 7PM-7AM, please contact night-coverage  06/06/2022, 3:56 PM

## 2022-06-06 NOTE — Consult Note (Signed)
Cardiology Consultation   Patient ID: Leslie Parker MRN: [id number]; DOB: 1932-07-30  Admit date: 06/05/2022 Date of Consult: 06/06/2022  PCP:  Gladstone Lighter, MD   Leslie Parker Providers Cardiologist:  None      New consult done by Dr. Garen Lah  Patient Profile:   Leslie Parker is a 88 y.o. female with a hx of hypertension, gold stage III COPD followed by pulmonary, asthma, hypoxia, osteoarthritis, CKD 3B, diabetes who is being seen 06/06/2022 for the evaluation of new onset atrial fibrillation and near syncopal episode at the request of Dr. Damita Dunnings.  History of Present Illness:   Leslie Parker is an 88 year old female with history of hypertension, gold stage III COPD followed by pulmonary, asthma, hypoxia, osteoarthritis, CKD 3B, diabetes.  Patient presented to the [facility] emergency department on 06/05/2022 after a near syncopal episode while standing up.  She was caught and assisted to the floor.  EMS found the patient with rapid heart rate that showed A-fib RVR ranging 100-130.  She had some associated shortness of breath and some weakness to her bilateral lower extremities with elevated heart rate.  Denies any chest pain or palpitations.  She has no previous history of atrial fibrillation and is not anticoagulated.  She stated that previously she was in her usual state of health but has been coughing and that been ongoing for the past month with associated fatigue.  She stated that she saw her pulmonologist and complained of feeling easily exhausted and having a nagging cough and wheezing.Was given steroids and antibiotics and advised to continue using her nebs. While in the emergency department she was given diltiazem IVP with good results of lowered heart rate into the 80-90's.  Initial vitals: Blood pressure 127/89, pulse rate 70, respirations 20, oxygen saturations of 98%  Pertinent labs: CO2 19, glucose 151, BUN 36, serum creatinine 1.66, calcium 8.2, estimated GFR 30,  TSH 1.148, free T4 1.54, D-dimer 2.28, BNP of 195.4, high-sensitivity troponin of 27 and 32  Imaging: Chest x-ray revealed chronic left lower lobe scarring or atelectasis with no acute airspace disease, VQ scan is pending for elevated D-dimer  Medications administered in the emergency department: Diltiazem 10 mg IVP, and diltiazem 30 mg p.o.   Past Medical History:  Diagnosis Date   Asthma    Cancer ([place])    Basal Cell   Diabetes mellitus without complication ([place])    Heart murmur    Hypertension    Impetigo    Osteoarthritis    Osteopenia    Vomiting    can not due to surgery   Wears dentures    full upper and lower    Past Surgical History:  Procedure Laterality Date   ABDOMINAL HYSTERECTOMY     BACK SURGERY     BLADDER SURGERY     mesh   CARPAL TUNNEL RELEASE Bilateral    CATARACT EXTRACTION Right 2017   CATARACT EXTRACTION W/PHACO Left 02/06/2016   Procedure: CATARACT EXTRACTION PHACO AND INTRAOCULAR LENS PLACEMENT ([place]) left eye;  Surgeon: Ronnell Freshwater, MD;  Location: [place];  Service: Ophthalmology;  Laterality: Left;  DIABETIC LEFT Cannot arrive before 9:30   CERVICAL DISC SURGERY     CHOLECYSTECTOMY     EYE SURGERY Bilateral    Cataract Extraction with IOL   FEMUR IM NAIL Left 02/04/2015   Procedure: INTRAMEDULLARY (IM) NAIL FEMORAL;  Surgeon: Thornton Park, MD;  Location: [facility];  Service: Orthopedics;  Laterality: Left;   HARDWARE REMOVAL  Left 01/17/2017   Procedure: HARDWARE REMOVAL;  Surgeon: Thornton Park, MD;  Location: [facility];  Service: Orthopedics;  Laterality: Left;   HERNIA REPAIR  2014   esophageal and gastric mesh. patient unable to throw up d/t mesh   HIP ARTHROPLASTY Left 01/26/2017   Procedure: ARTHROPLASTY BIPOLAR HIP (HEMIARTHROPLASTY) removal hardware left hip;  Surgeon: Earnestine Leys, MD;  Location: [facility];  Service: Orthopedics;  Laterality: Left;   JOINT REPLACEMENT Bilateral    knees   REPLACEMENT TOTAL  KNEE BILATERAL Bilateral 2007,2008   SHOULDER ARTHROSCOPY WITH OPEN ROTATOR CUFF REPAIR AND DISTAL CLAVICLE ACROMINECTOMY Left 10/25/2016   Procedure: SHOULDER ARTHROSCOPY WITH OPEN ROTATOR CUFF REPAIR AND DISTAL CLAVICLE ACROMINECTOMY;  Surgeon: Thornton Park, MD;  Location: [facility];  Service: Orthopedics;  Laterality: Left;   THYROID SURGERY     goiter removed   TOTAL HIP REVISION Left 12/02/2017   Procedure: TOTAL HIP REVISION;  Surgeon: Lovell Sheehan, MD;  Location: [facility];  Service: Orthopedics;  Laterality: Left;   TOTAL SHOULDER REPLACEMENT Right 2012     Home Medications:  Prior to Admission medications   Medication Sig Start Date End Date Taking? Authorizing Provider  albuterol (VENTOLIN HFA) 108 (90 Base) MCG/ACT inhaler Inhale 2 puffs into the lungs every 6 (six) hours as needed. 08/04/19  Yes [provider]  amLODipine (NORVASC) 10 MG tablet Take 10 mg by mouth daily. 01/05/14  Yes [provider]  colestipol (COLESTID) 1 g tablet Take 1 tablet by mouth 2 (two) times daily. 06/01/22  Yes [provider]  fluticasone-salmeterol (ADVAIR) 250-50 MCG/ACT AEPB Inhale 1 puff into the lungs in the morning and at bedtime.   Yes [provider]  furosemide (LASIX) 20 MG tablet Take 20 mg by mouth daily. 03/06/22  Yes [provider]  glipiZIDE (GLUCOTROL XL) 2.5 MG 24 hr tablet Take 2.5 mg by mouth daily with breakfast. 03/27/22  Yes [provider]  ipratropium-albuterol (DUONEB) 0.5-2.5 (3) MG/3ML SOLN Inhale 3 mLs into the lungs every 6 (six) hours as needed. 06/01/22  Yes [provider]  omeprazole (PRILOSEC) 20 MG capsule Take 20 mg by mouth daily. 05/21/22  Yes [provider]  traZODone (DESYREL) 100 MG tablet Take 100 mg by mouth at bedtime.   Yes [provider]  ascorbic acid (VITAMIN C) 250 MG CHEW Chew 250 mg by mouth daily.    [provider]  cholecalciferol (VITAMIN D3) 25 MCG (1000  UNIT) tablet Take 1,000 Units by mouth daily.    [provider]  Cyanocobalamin (B-12) 2500 MCG TABS Take 2,500 mcg by mouth daily.    [provider]  Magnesium Oxide 500 MG CAPS Take 500 mg by mouth daily.    [provider]  Multiple Vitamin (MULTIVITAMIN WITH MINERALS) TABS tablet Take 1 tablet by mouth daily. One-A-Day Women's Vitamin    [provider]    Inpatient Medications: Scheduled Meds:  aspirin EC  81 mg Oral Daily   colestipol  1 g Oral BID   diltiazem  30 mg Oral Q6H   enoxaparin (LOVENOX) injection  30 mg Subcutaneous Q24H   insulin aspart  0-15 Units Subcutaneous TID WC   insulin aspart  0-5 Units Subcutaneous QHS   mometasone-formoterol  2 puff Inhalation BID   pantoprazole  40 mg Oral Daily   traZODone  100 mg Oral QHS   Continuous Infusions:  PRN Meds: acetaminophen, albuterol, ondansetron (ZOFRAN) IV  Allergies:   No Known Allergies  Social History:   Social History   Socioeconomic History   Marital status: Divorced    Spouse name: Not on file   Number of children: Not on file   Years of education: Not on file   Highest education level: Not on file  Occupational History   Not on file  Tobacco Use   Smoking status: Never   Smokeless tobacco: Never  Vaping Use   Vaping Use: Never used  Substance and Sexual Activity   Alcohol use: No    Alcohol/week: 0.0 standard drinks of alcohol   Drug use: No   Sexual activity: Never  Other Topics Concern   Not on file  Social History Narrative   Not on file   Social Determinants of Health   Financial Resource Strain: Not on file  Food Insecurity: Not on file  Transportation Needs: Not on file  Physical Activity: Not on file  Stress: Not on file  Social Connections: Not on file  Intimate Partner Violence: Not on file    Family History:    Family History  Problem Relation Age of Onset   Heart failure Mother    Hypertension Mother    Emphysema Father       ROS:  Please see the history of present illness.  Review of Systems  Constitutional:  Negative for malaise/fatigue.  Respiratory:  Positive for cough and shortness of breath.   Neurological:  Positive for dizziness and weakness.    All other ROS reviewed and negative.     Physical Exam/Data:   Vitals:   06/06/22 0530 06/06/22 0600 06/06/22 0603 06/06/22 0700  BP: (!) 169/83 (!) 166/82  (!) 148/57  Pulse: 83 84  69  Resp:  16  15  Temp:   97.7 F (36.5 C)   TempSrc:   Axillary   SpO2: 96% 98%  93%  Weight:      Height:        Intake/Output Summary (Last 24 hours) at 06/06/2022 0807 Last data filed at 06/06/2022 0300 Gross per 24 hour  Intake 545.93 ml  Output --  Net 545.93 ml      06/05/2022    5:08 PM 12/04/2020    5:05 PM 09/03/2020    2:31 AM  Last 3 Weights  Weight (lbs) 182 lb 150 lb 175 lb  Weight (kg) 82.555 kg 68.04 kg 79.379 kg     Body mass index is 26.88 kg/m.  General:  Well nourished, well developed, in no acute distress HEENT: normal Neck: no JVD Vascular: No carotid bruits; Distal pulses 2+ bilaterally Cardiac:  normal S1, S2; irregularly irregular; no murmur  Lungs:  clear to auscultation bilaterally, no wheezing, rhonchi or rales, respirations are unlabored on room air at rest Abd: soft, nontender, no hepatomegaly  Ext: no edema Musculoskeletal:  No deformities, BUE and BLE strength normal and equal Skin: warm and dry  Neuro:  CNs 2-12 intact, no focal abnormalities noted Psych:  Normal affect   EKG:  The EKG was personally reviewed and demonstrates: Atrial fibrillation with a rate of 127 left axis deviation Telemetry:  Telemetry was personally reviewed and demonstrates:  rate controlled atrial fibrillation rates 80-90  Relevant CV Studies: [facility]TE completed [facility] 02/18/2020 ECHOCARDIOGRAPHIC DESCRIPTIONS  AORTIC ROOT                   Size: Normal             Dissection: INDETERM FOR DISSECTION  AORTIC VALVE  Leaflets:  Tricuspid                   Morphology: MILDLY THICKENED               Mobility: Fully mobile  LEFT VENTRICLE                   Size: Normal                        Anterior: Normal            Contraction: Normal                         Lateral: Normal             Closest EF: >55% (Estimated)                Septal: Normal              LV Masses: No Masses                       Apical: Normal                    LVH: MILD LVH                      Inferior: Normal                                                      Posterior: Normal           Dias.FxClass: (Grade 1) relaxation abnormal, E/A reversal  MITRAL VALVE               Leaflets: Normal                        Mobility: Fully mobile             Morphology: THICKENED LEAFLET(S)  LEFT ATRIUM                   Size: MILDLY ENLARGED              LA Masses: No masses              IA Septum: Normal IAS  MAIN PA                   Size: Normal  PULMONIC VALVE             Morphology: Normal                        Mobility: Fully mobile  RIGHT VENTRICLE              RV Masses: No Masses                         Size: Normal              Free Wall: Normal                     Contraction: Normal  TRICUSPID VALVE  Leaflets: Normal                        Mobility: Fully mobile             Morphology: Normal  RIGHT ATRIUM                   Size: Normal                        RA Other: None                RA Mass: No masses  PERICARDIUM                 Fluid: No effusion  INFERIOR VENACAVA                   Size: Normal Normal respiratory collapse   Laboratory Data:  High Sensitivity Troponin:   Recent Labs  Lab 06/05/22 1723 06/05/22 1942  TROPONINIHS 27* 32*     Chemistry Recent Labs  Lab 06/05/22 1723 06/06/22 0456  NA 138 141  K 3.6 3.5  CL 111 113*  CO2 19* 22  GLUCOSE 151* 127*  BUN 36* 38*  CREATININE 1.66* 1.76*  CALCIUM 8.2* 8.6*  MG 1.7  --   GFRNONAA 30* 28*  ANIONGAP 8 6    Recent Labs  Lab  06/05/22 1723  PROT 6.0*  ALBUMIN 3.3*  AST 24  ALT 15  ALKPHOS 41  BILITOT 0.9   Lipids No results for input(s): "CHOL", "TRIG", "HDL", "LABVLDL", "LDLCALC", "CHOLHDL" in the last 168 hours.  Hematology Recent Labs  Lab 06/05/22 1723  WBC 9.0  RBC 4.10  HGB 12.2  HCT 36.0  MCV 87.8  MCH 29.8  MCHC 33.9  RDW 13.3  PLT 186   Thyroid  Recent Labs  Lab 06/05/22 1723  TSH 1.148  FREET4 1.54*    BNP Recent Labs  Lab 06/05/22 1723  BNP 195.4*    DDimer  Recent Labs  Lab 06/05/22 1723  DDIMER 2.28*     Radiology/Studies:  DG Chest Portable 1 View  Result Date: 06/05/2022 CLINICAL DATA:  Near syncope, short of breath, tachycardia EXAM: PORTABLE CHEST 1 VIEW COMPARISON:  09/03/2020 FINDINGS: Single frontal view of the chest demonstrates an enlarged cardiac silhouette. Stable ectasia and atherosclerosis of the thoracic aorta. No acute airspace disease, effusion, or pneumothorax. There is chronic atelectasis within the left lower lobe. Stable right shoulder arthroplasty. IMPRESSION: 1. Chronic left lower lobe scarring or atelectasis. 2. No acute airspace disease. Electronically Signed   By: Randa Ngo M.D.   On: 06/05/2022 18:01     Assessment and Plan:   New onset atrial fibrillation with RVR with near syncopal event -rate controlled on oral diltiazem  -starting apixaban 2.5 mg bid (reduced dosing for age over 28 and elevated serum creatine) for CHA2DS2-VASc of at least 5 -echocardiogram ordered and pending -with patients fatigue and weakness, she could have been in atrial fibrillation for the last month -discussed options of doac x 1 month uninterrupted then DCCV as an outpatient which patient is in agreeable to -continue oral diltiazem, can consolidate prior to discharge  Elevated high-sensitivity troponin -trended 27 and 32, flat likely demand ischemia from atrial fibrillation RVR -patient chronically has elevated troponins -denies chest pain prior to  episode and currently -no ischemic changes noted on EKG  Hypertension -blood pressure 148/57 -continue diltiazem -PTA amlodipine  on hold -vitals per unit protocol  CKD stage IIIb -serum creatine 1.76 -baseline serum creatine 1.6-1.7 -monitor urine output -monitor/trend/replace electrolytes as needed -avoid nephrotoxic agents where able  COPD and asthma -Breathing has remained stable -She remains on room air -Continued on inhalers -Managed by primary team  Diabetes -States sugars have remained well controlled -Continued on insulin -Management per primary team   Risk Assessment/Risk Scores:          CHA2DS2-VASc Score = 5   This indicates a 7.2% annual risk of stroke. The patient's score is based upon: CHF History: 0 HTN History: 1 Diabetes History: 1 Stroke History: 0 Vascular Disease History: 0 Age Score: 2 Gender Score: 1         For questions or updates, please contact [place] Please consult [email or web address] for contact info under    Signed, Elliot Simoneaux, NP  06/06/2022 8:07 AM

## 2022-06-06 NOTE — ED Notes (Signed)
Pt cleaned and new brief placed.

## 2022-06-06 NOTE — Progress Notes (Signed)
PT Cancellation Note  Patient Details Name: Leslie Parker MRN: [id number] DOB: [birth year]   Cancelled Treatment:    Reason Eval/Treat Not Completed: Medical issues which prohibited therapy Imaging concerning for PE, spoke with MD to clarify imminent d/c PT orders, pt wil be needing some additional testing and MD okay to hold for today.  Will maintain on caseload and attempt to see when appropriate.  Kreg Shropshire, DPT 06/06/2022, 3:58 PM

## 2022-06-06 NOTE — Progress Notes (Signed)
ANTICOAGULATION CONSULT NOTE - Initial Consult  Pharmacy Consult for enoxaparin Indication:  VTE treatment  No Known Allergies  Patient Measurements: Height: '5\' 9"'$  (175.3 cm) Weight: 82.6 kg (182 lb) IBW/kg (Calculated) : 66.2  Vital Signs: Temp: 97.7 F (36.5 C) (11/15 0603) Temp Source: Axillary (11/15 0603) BP: 148/57 (11/15 0700) Pulse Rate: 69 (11/15 0700)  Labs: Recent Labs    06/05/22 1723 06/05/22 1942 06/06/22 0456  HGB 12.2  --   --   HCT 36.0  --   --   PLT 186  --   --   CREATININE 1.66*  --  1.76*  TROPONINIHS 27* 32*  --     Estimated Creatinine Clearance: 25.9 mL/min (A) (by C-G formula based on SCr of 1.76 mg/dL (H)).   Medical History: Past Medical History:  Diagnosis Date   Asthma    Cancer ([place])    Basal Cell   Diabetes mellitus without complication ([facility])    Heart murmur    Hypertension    Impetigo    Osteoarthritis    Osteopenia    Vomiting    can not due to surgery   Wears dentures    full upper and lower    Medications: Not on PTA anticoagulation per my chart review  Assessment: 88 year old female PMH HTN, COPD, asthma, OA, CKD3, diabetes.   VQ scan showing intermediate probability of PE  Goal of Therapy:  Monitor platelets by anticoagulation protocol: Yes   Plan:  Enoxaparin 1 mg/kg every 12 hours Monitor s/sx bleeding, clinical course.   Wynelle Cleveland 06/06/2022,9:31 AM

## 2022-06-06 NOTE — ED Notes (Signed)
Informed RN bed assigned 

## 2022-06-07 ENCOUNTER — Telehealth (HOSPITAL_COMMUNITY): Payer: Self-pay

## 2022-06-07 ENCOUNTER — Other Ambulatory Visit (HOSPITAL_COMMUNITY): Payer: Self-pay

## 2022-06-07 DIAGNOSIS — I4891 Unspecified atrial fibrillation: Secondary | ICD-10-CM | POA: Diagnosis not present

## 2022-06-07 LAB — CBC
HCT: 36.7 % (ref 36.0–46.0)
Hemoglobin: 12.6 g/dL (ref 12.0–15.0)
MCH: 30 pg (ref 26.0–34.0)
MCHC: 34.3 g/dL (ref 30.0–36.0)
MCV: 87.4 fL (ref 80.0–100.0)
Platelets: 196 10*3/uL (ref 150–400)
RBC: 4.2 MIL/uL (ref 3.87–5.11)
RDW: 13.7 % (ref 11.5–15.5)
WBC: 6.8 10*3/uL (ref 4.0–10.5)
nRBC: 0 % (ref 0.0–0.2)

## 2022-06-07 LAB — GLUCOSE, CAPILLARY: Glucose-Capillary: 134 mg/dL — ABNORMAL HIGH (ref 70–99)

## 2022-06-07 MED ORDER — DILTIAZEM HCL ER COATED BEADS 120 MG PO CP24: 240.0000 mg | ORAL_CAPSULE | Freq: Every day | ORAL | Status: DC

## 2022-06-07 MED ORDER — DILTIAZEM HCL ER COATED BEADS 180 MG PO CP24: 180.0000 mg | ORAL_CAPSULE | Freq: Every day | ORAL | 1 refills | Status: DC

## 2022-06-07 MED ORDER — APIXABAN 2.5 MG PO TABS: 2.5000 mg | ORAL_TABLET | Freq: Two times a day (BID) | ORAL | 1 refills | Status: DC

## 2022-06-07 MED ORDER — DILTIAZEM HCL ER COATED BEADS 240 MG PO CP24: 240.0000 mg | ORAL_CAPSULE | Freq: Every day | ORAL | 6 refills | Status: DC

## 2022-06-07 NOTE — TOC Benefit Eligibility Note (Signed)
Patient Teacher, English as a foreign language completed.    The patient is currently admitted and upon discharge could be taking Eliquis 2.'5mg'$ .  The current 30 day co-pay is $45.00.   The patient is insured through [employer] [place] Patient Advocate Specialist [place] Patient Advocate Team Direct Number: [phone] Fax: [phone]

## 2022-06-07 NOTE — Discharge Summary (Signed)
Physician Discharge Summary  Leslie Parker BSW:[id number] DOB: 10-Sep-1932 DOA: 06/05/2022  PCP: Gladstone Lighter, MD  Admit date: 06/05/2022 Discharge date: 06/07/2022  Admitted From: Home Disposition:  Home (declined [facility] services)  Recommendations for Outpatient Follow-up:  Follow up with PCP in 1-2 weeks Follow up with cardiology 2-3 weeks  Home Health:No  Equipment/Devices:None   Discharge Condition:Stable  CODE STATUS:full  Diet recommendation: Heart healthy  Brief/Interim Summary:  88 y.o. female with medical history significant for HTN, stage III COPD, followed by pulmonology, last seen 10/4, CKD 3B, diabetes, who presents to the ED by EMS following an near syncopal episode while at the [facility].  She did not fall but was helped down.  The episode was preceded with dizziness and shortness of breath but no chest pain.  She was previously in her usual state of health but had been coughing and had 1 going fatigue for the past month.  She saw her pulmonologist on 10/4 when she complained of feeling easily exhausted, having a nagging cough and wheezing(per review of note).  EMS found her to be in A-fib with rate of 100-130s.    VQ scan with intermediate probability of PE Followup CTA ordered, however will need to ensure adequate renal perfusion prior to study Seen by cardiology , recs appreciated  Renal function improved.  CTA done, negative for acute PE HR and symptoms improved.  DC on cardizem CD '240mg'$  QD, followup outpatient cardiology    Discharge Diagnoses:  Principal Problem:   Rapid atrial fibrillation, new onset(HCC) Active Problems:   Postural dizziness with presyncope   Stage 3b chronic kidney disease (HCC)   Elevated troponin   Hypertension   Diabetes mellitus without complication (HCC)   Chronic obstructive lung disease (HCC)   Metabolic acidosis   Atrial fibrillation with rapid ventricular response (HCC)   Rapid atrial fibrillation, new onset(HCC) Achieved  rapid rate control with a single bolus dose of diltiazem 10 mg Echo normal Plan: DC home Cardizem CD '240mg'$  daily Eliquis 2.5 BID FU OP cardiology     Postural dizziness with presyncope Likely secondary to rapid A-fib Unable to exclude PE VQ scan intermediate CTA negative Plan: Continue eliquis as above, prophylactic dose  Discharge Instructions  Discharge Instructions     Amb referral to [facility]   Complete by: As directed    Diet - low sodium heart healthy   Complete by: As directed    Increase activity slowly   Complete by: As directed       Allergies as of 06/07/2022   No Known Allergies      Medication List     STOP taking these medications    amLODipine 10 MG tablet Commonly known as: NORVASC       TAKE these medications    albuterol 108 (90 Base) MCG/ACT inhaler Commonly known as: VENTOLIN HFA Inhale 2 puffs into the lungs every 6 (six) hours as needed.   apixaban 2.5 MG Tabs tablet Commonly known as: ELIQUIS Take 1 tablet (2.5 mg total) by mouth 2 (two) times daily.   ascorbic acid 250 MG Chew Commonly known as: VITAMIN C Chew 250 mg by mouth daily.   B-12 2500 MCG Tabs Take 2,500 mcg by mouth daily.   cholecalciferol 25 MCG (1000 UNIT) tablet Commonly known as: VITAMIN D3 Take 1,000 Units by mouth daily.   colestipol 1 g tablet Commonly known as: COLESTID Take 1 tablet by mouth 2 (two) times daily.   diltiazem 240 MG 24 hr  capsule Commonly known as: CARDIZEM CD Take 1 capsule (240 mg total) by mouth daily. Start taking on: June 08, 2022   fluticasone-salmeterol 250-50 MCG/ACT Aepb Commonly known as: ADVAIR Inhale 1 puff into the lungs in the morning and at bedtime.   furosemide 20 MG tablet Commonly known as: LASIX Take 20 mg by mouth daily.   glipiZIDE 2.5 MG 24 hr tablet Commonly known as: GLUCOTROL XL Take 2.5 mg by mouth daily with breakfast.   ipratropium-albuterol 0.5-2.5 (3) MG/3ML Soln Commonly known as:  DUONEB Inhale 3 mLs into the lungs every 6 (six) hours as needed.   Magnesium Oxide -Mg Supplement 500 MG Caps Take 500 mg by mouth daily.   multivitamin with minerals Tabs tablet Take 1 tablet by mouth daily. One-A-Day Women's Vitamin   omeprazole 20 MG capsule Commonly known as: PRILOSEC Take 20 mg by mouth daily.   tiZANidine 4 MG tablet Commonly known as: ZANAFLEX Take 4 mg by mouth daily.   traZODone 100 MG tablet Commonly known as: DESYREL Take 100 mg by mouth at bedtime.        Follow-up Information     Leslie Nordmann, NP Follow up on 06/22/2022.   Specialty: Cardiology Why: 8:25 AM Contact information: [address] [place] [address] [phone]                No Known Allergies  Consultations: Cardiology- [facility]   Procedures/Studies: CT Angio Chest Pulmonary Embolism (PE) W or WO Contrast  Result Date: 06/06/2022 CLINICAL DATA:  Hypertension, COPD, near syncope, abnormal nuclear medicine perfusion scan EXAM: CT ANGIOGRAPHY CHEST WITH CONTRAST TECHNIQUE: Multidetector CT imaging of the chest was performed using the standard protocol during bolus administration of intravenous contrast. Multiplanar CT image reconstructions and MIPs were obtained to evaluate the vascular anatomy. RADIATION DOSE REDUCTION: This exam was performed according to the departmental dose-optimization program which includes automated exposure control, adjustment of the mA and/or kV according to patient size and/or use of iterative reconstruction technique. CONTRAST:  37m OMNIPAQUE IOHEXOL 350 MG/ML SOLN COMPARISON:  09/03/2020, 12/04/2020, 06/05/2022, 06/06/2022 FINDINGS: Cardiovascular: This is a technically adequate evaluation of the pulmonary vasculature. No filling defects or pulmonary emboli. Dilated main pulmonary trunk measuring up to 3.3 cm consistent with pulmonary arterial hypertension, stable. The heart is unremarkable without pericardial effusion. No evidence of thoracic  aortic aneurysm or dissection. Atherosclerosis throughout the aorta and coronary vasculature. Mediastinum/Nodes: Stable appearance of the thyroid, trachea, and esophagus. Small hiatal hernia. No pathologic mediastinal or hilar adenopathy. Lungs/Pleura: No acute airspace disease, effusion, or pneumothorax. Chronic scarring within the bilateral lower lobes. Central airways are patent. Upper Abdomen: There is a 2.2 cm splenic hypodensity is increased in size since prior study, or a nonenhancing cyst was seen on MRI. There is a 1.6 x 2.7 cm indeterminate cystic structure interposed between the pancreatic body and stomach, increased in size since prior MRI. No other acute upper abdominal findings. Musculoskeletal: There are no acute or destructive bony lesions. Reconstructed images demonstrate no additional findings. Review of the MIP images confirms the above findings. IMPRESSION: 1. No evidence of pulmonary embolus. 2. No acute intrathoracic process.  Stable bibasilar scarring. 3. Stable hiatal hernia. 4. Increased size of the cystic mass off the superior aspect of the pancreatic body, measuring 2.7 x 1.6 cm on this exam. If the patient would be a candidate should neoplasm be detected, repeat dedicated nonemergent outpatient pancreatic MRI with and without contrast could be considered. 5. Aortic Atherosclerosis (ICD10-I70.0). Coronary artery  atherosclerosis. Electronically Signed   By: Randa Ngo M.D.   On: 06/06/2022 17:07   ECHOCARDIOGRAM COMPLETE  Result Date: 06/06/2022    ECHOCARDIOGRAM REPORT   Patient Name:   Leslie Parker [place] Date of Exam: 06/06/2022 Medical Rec #:  [id number]     Height:       69.0 in Accession #:    [id number]    Weight:       182.0 lb Date of Birth:  16-Sep-1932      BSA:          1.985 m Patient Age:    31 years      BP:           148/57 mmHg Patient Gender: F             HR:           74 bpm. Exam Location:  [facility] Procedure: 2D Echo, Color Doppler and Cardiac Doppler Indications:     R55  Syncope  History:         Patient has no prior history of Echocardiogram examinations.                  COPD, Signs/Symptoms:Shortness of Breath,                  Dizziness/Lightheadedness and Fatigue; Risk                  Factors:Hypertension and Diabetes.  Sonographer:     Charmayne Sheer Referring Phys:  [id number] Athena Masse Diagnosing Phys: Kate Sable MD IMPRESSIONS  1. Left ventricular ejection fraction, by estimation, is 60 to 65%. The left ventricle has normal function. The left ventricle has no regional wall motion abnormalities. There is mild left ventricular hypertrophy. Left ventricular diastolic parameters are indeterminate.  2. Right ventricular systolic function is normal. The right ventricular size is normal.  3. Left atrial size was mildly dilated.  4. The mitral valve is degenerative. Mild mitral valve regurgitation.  5. Tricuspid valve regurgitation is mild to moderate.  6. The aortic valve is calcified. Aortic valve regurgitation is not visualized. Mild aortic valve stenosis. Aortic valve mean gradient measures 11.5 mmHg.  7. The inferior vena cava is normal in size with greater than 50% respiratory variability, suggesting right atrial pressure of 3 mmHg. FINDINGS  Left Ventricle: Left ventricular ejection fraction, by estimation, is 60 to 65%. The left ventricle has normal function. The left ventricle has no regional wall motion abnormalities. The left ventricular internal cavity size was normal in size. There is  mild left ventricular hypertrophy. Left ventricular diastolic parameters are indeterminate. Right Ventricle: The right ventricular size is normal. No increase in right ventricular wall thickness. Right ventricular systolic function is normal. Left Atrium: Left atrial size was mildly dilated. Right Atrium: Right atrial size was normal in size. Pericardium: There is no evidence of pericardial effusion. Mitral Valve: The mitral valve is degenerative in appearance. There is mild  thickening of the mitral valve leaflet(s). Mild mitral annular calcification. Mild mitral valve regurgitation. Tricuspid Valve: The tricuspid valve is grossly normal. Tricuspid valve regurgitation is mild to moderate. Aortic Valve: The aortic valve is calcified. Aortic valve regurgitation is not visualized. Mild aortic stenosis is present. Aortic valve mean gradient measures 11.5 mmHg. Aortic valve peak gradient measures 20.8 mmHg. Aortic valve area, by VTI measures 1.31 cm. Pulmonic Valve: The pulmonic valve was not well visualized. Pulmonic valve regurgitation is not visualized. Aorta: The aortic root  and ascending aorta are structurally normal, with no evidence of dilitation. Venous: The inferior vena cava is normal in size with greater than 50% respiratory variability, suggesting right atrial pressure of 3 mmHg. IAS/Shunts: No atrial level shunt detected by color flow Doppler.  LEFT VENTRICLE PLAX 2D LVIDd:         4.80 cm LVIDs:         2.60 cm LV PW:         1.10 cm LV IVS:        1.00 cm LVOT diam:     2.00 cm LV SV:         66 LV SV Index:   33 LVOT Area:     3.14 cm  RIGHT VENTRICLE RV Basal diam:  4.00 cm RV S prime:     18.80 cm/s LEFT ATRIUM             Index        RIGHT ATRIUM           Index LA diam:        4.30 cm 2.17 cm/m   RA Area:     12.60 cm LA Vol (A2C):   59.8 ml 30.13 ml/m  RA Volume:   33.90 ml  17.08 ml/m LA Vol (A4C):   56.7 ml 28.57 ml/m LA Biplane Vol: 59.3 ml 29.88 ml/m  AORTIC VALVE                     PULMONIC VALVE AV Area (Vmax):    1.45 cm      PV Vmax:       1.00 m/s AV Area (Vmean):   1.54 cm      PV Vmean:      71.400 cm/s AV Area (VTI):     1.31 cm      PV VTI:        0.162 m AV Vmax:           228.00 cm/s   PV Peak grad:  4.0 mmHg AV Vmean:          158.000 cm/s  PV Mean grad:  2.0 mmHg AV VTI:            0.506 m AV Peak Grad:      20.8 mmHg AV Mean Grad:      11.5 mmHg LVOT Vmax:         105.00 cm/s LVOT Vmean:        77.200 cm/s LVOT VTI:          0.211 m LVOT/AV  VTI ratio: 0.42  AORTA Ao Root diam: 3.40 cm MITRAL VALVE MV Area (PHT): 3.81 cm     SHUNTS MV Decel Time: 199 msec     Systemic VTI:  0.21 m MV E velocity: 91.87 cm/s   Systemic Diam: 2.00 cm MV A velocity: 139.67 cm/s MV E/A ratio:  0.66 Kate Sable MD Electronically signed by Kate Sable MD Signature Date/Time: 06/06/2022/9:49:35 AM    Final (Updated)    NM Pulmonary Perfusion  Result Date: 06/06/2022 CLINICAL DATA:  Elevated D-dimer concern for pulmonary embolus. EXAM: NUCLEAR MEDICINE PERFUSION LUNG SCAN TECHNIQUE: Perfusion images were obtained in multiple projections after intravenous injection of radiopharmaceutical. Ventilation scans intentionally deferred if perfusion scan and chest x-ray adequate for interpretation during COVID 19 epidemic. RADIOPHARMACEUTICALS:  4.2 mCi Tc-11mMAA IV COMPARISON:  Chest radiograph June 05, 2022 and CT chest September 03, 2020. FINDINGS: On frontal and  posterior views there is symmetric bilateral radiotracer distribution with findings compatible with cardiomegaly without wedge-shaped perfusion defects. A lateral and oblique views there are few small to moderate wedge-shaped perfusion defects. Additionally there is a linear perfusion defect seen on these views which likely reflects artifact from the right shoulder arthroplasty. IMPRESSION: Scintigraphic findings compatible with intermediate probability of pulmonary embolus. Consider further evaluation with CTA chest if clinically indicated. Electronically Signed   By: Dahlia Bailiff M.D.   On: 06/06/2022 09:05   DG Chest Portable 1 View  Result Date: 06/05/2022 CLINICAL DATA:  Near syncope, short of breath, tachycardia EXAM: PORTABLE CHEST 1 VIEW COMPARISON:  09/03/2020 FINDINGS: Single frontal view of the chest demonstrates an enlarged cardiac silhouette. Stable ectasia and atherosclerosis of the thoracic aorta. No acute airspace disease, effusion, or pneumothorax. There is chronic atelectasis  within the left lower lobe. Stable right shoulder arthroplasty. IMPRESSION: 1. Chronic left lower lobe scarring or atelectasis. 2. No acute airspace disease. Electronically Signed   By: Randa Ngo M.D.   On: 06/05/2022 18:01      Subjective: Seen and examined on day of dc.  Stable, no distress  Discharge Exam: Vitals:   06/07/22 0745 06/07/22 1029  BP: (!) 157/71 (!) 156/63  Pulse: 97 95  Resp: 18 17  Temp: 98 F (36.7 C) 98.2 F (36.8 C)  SpO2: 96% 100%   Vitals:   06/07/22 0353 06/07/22 0600 06/07/22 0745 06/07/22 1029  BP: 138/69  (!) 157/71 (!) 156/63  Pulse: 83  97 95  Resp: '20  18 17  '$ Temp: 97.9 F (36.6 C)  98 F (36.7 C) 98.2 F (36.8 C)  TempSrc:    Oral  SpO2: 96%  96% 100%  Weight:  80 kg    Height:        General: Pt is alert, awake, not in acute distress Cardiovascular: Reg rate, irreg rhythm, no murmur Respiratory: CTA bilaterally, no wheezing, no rhonchi Abdominal: Soft, NT, ND, bowel sounds + Extremities: no edema, no cyanosis    The results of significant diagnostics from this hospitalization (including imaging, microbiology, ancillary and laboratory) are listed below for reference.     Microbiology: No results found for this or any previous visit (from the past 240 hour(s)).   Labs: BNP (last 3 results) Recent Labs    06/05/22 1723  BNP 810.1*   Basic Metabolic Panel: Recent Labs  Lab 06/05/22 1723 06/06/22 0456 06/06/22 1556  NA 138 141 142  K 3.6 3.5 3.8  CL 111 113* 113*  CO2 19* 22 21*  GLUCOSE 151* 127* 122*  BUN 36* 38* 30*  CREATININE 1.66* 1.76* 1.52*  CALCIUM 8.2* 8.6* 8.9  MG 1.7  --   --    Liver Function Tests: Recent Labs  Lab 06/05/22 1723  AST 24  ALT 15  ALKPHOS 41  BILITOT 0.9  PROT 6.0*  ALBUMIN 3.3*   No results for input(s): "LIPASE", "AMYLASE" in the last 168 hours. No results for input(s): "AMMONIA" in the last 168 hours. CBC: Recent Labs  Lab 06/05/22 1723 06/07/22 0544  WBC 9.0 6.8   NEUTROABS 7.6  --   HGB 12.2 12.6  HCT 36.0 36.7  MCV 87.8 87.4  PLT 186 196   Cardiac Enzymes: No results for input(s): "CKTOTAL", "CKMB", "CKMBINDEX", "TROPONINI" in the last 168 hours. BNP: Invalid input(s): "POCBNP" CBG: Recent Labs  Lab 06/05/22 2137 06/06/22 0839 06/06/22 2058 06/07/22 0813  GLUCAP 135* 110* 286* 134*   D-Dimer  Recent Labs    06/05/22 1723  DDIMER 2.28*   Hgb A1c Recent Labs    06/06/22 0456  HGBA1C 6.5*   Lipid Profile No results for input(s): "CHOL", "HDL", "LDLCALC", "TRIG", "CHOLHDL", "LDLDIRECT" in the last 72 hours. Thyroid function studies Recent Labs    06/05/22 1723  TSH 1.148   Anemia work up No results for input(s): "VITAMINB12", "FOLATE", "FERRITIN", "TIBC", "IRON", "RETICCTPCT" in the last 72 hours. Urinalysis    Component Value Date/Time   COLORURINE YELLOW (A) 09/03/2020 0243   APPEARANCEUR CLOUDY (A) 09/03/2020 0243   APPEARANCEUR Clear 02/19/2013 0254   LABSPEC 1.010 09/03/2020 0243   LABSPEC 1.006 02/19/2013 0254   PHURINE 7.0 09/03/2020 0243   GLUCOSEU NEGATIVE 09/03/2020 0243   GLUCOSEU Negative 02/19/2013 0254   HGBUR NEGATIVE 09/03/2020 0243   BILIRUBINUR NEGATIVE 09/03/2020 0243   BILIRUBINUR Negative 02/19/2013 0254   KETONESUR NEGATIVE 09/03/2020 0243   PROTEINUR 30 (A) 09/03/2020 0243   NITRITE NEGATIVE 09/03/2020 0243   LEUKOCYTESUR LARGE (A) 09/03/2020 0243   LEUKOCYTESUR 1+ 02/19/2013 0254   Sepsis Labs Recent Labs  Lab 06/05/22 1723 06/07/22 0544  WBC 9.0 6.8   Microbiology No results found for this or any previous visit (from the past 240 hour(s)).   Time coordinating discharge: Over 30 minutes  SIGNED:   Sidney Ace, MD  [facility] 06/07/2022, 3:29 PM Pager   If 7PM-7AM, please contact night-coverage

## 2022-06-07 NOTE — TOC Transition Note (Signed)
Transition of Care [facility]) - CM/SW Discharge Note   Patient Details  Name: Leslie Parker MRN: [id number] Date of Birth: Feb 11, 1933  [facility] of [facility]) CM/SW Contact:  Tiburcio Bash, LCSW Phone Number: 06/07/2022, 10:45 AM   Clinical Narrative:     Patient informed of [facility] recs at dc, she reports she declines at this time and reports she feels she does not need HH PT at this time.   She reports her daughter is picking her up and she has a walker that her daughter is bringing.   Reports no dc needs.   Final next level of care: Home/Self Care Barriers to Discharge: No Barriers Identified   Patient Goals and CMS Choice Patient states their goals for this hospitalization and ongoing recovery are:: to go home CMS Medicare.gov Compare Post Acute Care list provided to:: Patient Choice offered to / list presented to : Patient  Discharge Placement                       Discharge Plan and Services                                     Social Determinants of Health (SDOH) Interventions     Readmission Risk Interventions     No data to display

## 2022-06-07 NOTE — Progress Notes (Signed)
Cardiology Progress Note   Patient Name: Leslie Parker Date of Encounter: 06/07/2022  Primary Cardiologist: Kate Sable, MD  Subjective   Feels well.  Rates 80-100.  Thinks breathing is close to baseline. Ambulated w/ PT.  Eager to go home.  Inpatient Medications    Scheduled Meds:  apixaban  2.5 mg Oral BID   colestipol  1 g Oral BID   [START ON 06/08/2022] diltiazem  240 mg Oral Daily   insulin aspart  0-15 Units Subcutaneous TID WC   insulin aspart  0-5 Units Subcutaneous QHS   mometasone-formoterol  2 puff Inhalation BID   pantoprazole  40 mg Oral Daily   traZODone  100 mg Oral QHS   Continuous Infusions:  sodium chloride Stopped (06/07/22 1121)   PRN Meds: acetaminophen, albuterol, ondansetron (ZOFRAN) IV   Vital Signs    Vitals:   06/07/22 0353 06/07/22 0600 06/07/22 0745 06/07/22 1029  BP: 138/69  (!) 157/71 (!) 156/63  Pulse: 83  97 95  Resp: '20  18 17  '$ Temp: 97.9 F (36.6 C)  98 F (36.7 C) 98.2 F (36.8 C)  TempSrc:    Oral  SpO2: 96%  96% 100%  Weight:  80 kg    Height:        Intake/Output Summary (Last 24 hours) at 06/07/2022 1159 Last data filed at 06/07/2022 0600 Gross per 24 hour  Intake 1569.78 ml  Output 900 ml  Net 669.78 ml   Filed Weights   06/05/22 1708 06/07/22 0600  Weight: 82.6 kg 80 kg    Physical Exam   GEN: Obese, in no acute distress.  HEENT: Grossly normal.  Neck: obese - difficult to gauge JVP.  No carotid bruits or masses. Cardiac: Ir, Ir, distant, 2/6 syst murmur @ LLSB.  No rubs or gallops. No clubbing, cyanosis, edema.  Radials 2+, DP/PT 2+ and equal bilaterally.  Respiratory:  Respirations regular and unlabored, clear to auscultation bilaterally. GI: Obese, soft, nontender, nondistended, BS + x 4. MS: no deformity or atrophy. Skin: warm and dry, no rash. Neuro:  Strength and sensation are intact. Psych: AAOx3.  Normal affect.  Labs    Chemistry Recent Labs  Lab 06/05/22 1723 06/06/22 0456  06/06/22 1556  NA 138 141 142  K 3.6 3.5 3.8  CL 111 113* 113*  CO2 19* 22 21*  GLUCOSE 151* 127* 122*  BUN 36* 38* 30*  CREATININE 1.66* 1.76* 1.52*  CALCIUM 8.2* 8.6* 8.9  PROT 6.0*  --   --   ALBUMIN 3.3*  --   --   AST 24  --   --   ALT 15  --   --   ALKPHOS 41  --   --   BILITOT 0.9  --   --   GFRNONAA 30* 28* 33*  ANIONGAP '8 6 8     '$ Hematology Recent Labs  Lab 06/05/22 1723 06/07/22 0544  WBC 9.0 6.8  RBC 4.10 4.20  HGB 12.2 12.6  HCT 36.0 36.7  MCV 87.8 87.4  MCH 29.8 30.0  MCHC 33.9 34.3  RDW 13.3 13.7  PLT 186 196    Cardiac Enzymes  Recent Labs  Lab 06/05/22 1723 06/05/22 1942  TROPONINIHS 27* 32*      BNP    Component Value Date/Time   BNP 195.4 (H) 06/05/2022 1723    ProBNP No results found for: "PROBNP"   DDimer  Recent Labs  Lab 06/05/22 1723  DDIMER 2.28*  HbA1c  Lab Results  Component Value Date   HGBA1C 6.5 (H) 06/06/2022    Radiology    CT Angio Chest Pulmonary Embolism (PE) W or WO Contrast  Result Date: 06/06/2022 CLINICAL DATA:  Hypertension, COPD, near syncope, abnormal nuclear medicine perfusion scan EXAM: CT ANGIOGRAPHY CHEST WITH CONTRAST TECHNIQUE: Multidetector CT imaging of the chest was performed using the standard protocol during bolus administration of intravenous contrast. Multiplanar CT image reconstructions and MIPs were obtained to evaluate the vascular anatomy. RADIATION DOSE REDUCTION: This exam was performed according to the departmental dose-optimization program which includes automated exposure control, adjustment of the mA and/or kV according to patient size and/or use of iterative reconstruction technique. CONTRAST:  18m OMNIPAQUE IOHEXOL 350 MG/ML SOLN COMPARISON:  09/03/2020, 12/04/2020, 06/05/2022, 06/06/2022 FINDINGS: Cardiovascular: This is a technically adequate evaluation of the pulmonary vasculature. No filling defects or pulmonary emboli. Dilated main pulmonary trunk measuring up to 3.3 cm  consistent with pulmonary arterial hypertension, stable. The heart is unremarkable without pericardial effusion. No evidence of thoracic aortic aneurysm or dissection. Atherosclerosis throughout the aorta and coronary vasculature. Mediastinum/Nodes: Stable appearance of the thyroid, trachea, and esophagus. Small hiatal hernia. No pathologic mediastinal or hilar adenopathy. Lungs/Pleura: No acute airspace disease, effusion, or pneumothorax. Chronic scarring within the bilateral lower lobes. Central airways are patent. Upper Abdomen: There is a 2.2 cm splenic hypodensity is increased in size since prior study, or a nonenhancing cyst was seen on MRI. There is a 1.6 x 2.7 cm indeterminate cystic structure interposed between the pancreatic body and stomach, increased in size since prior MRI. No other acute upper abdominal findings. Musculoskeletal: There are no acute or destructive bony lesions. Reconstructed images demonstrate no additional findings. Review of the MIP images confirms the above findings. IMPRESSION: 1. No evidence of pulmonary embolus. 2. No acute intrathoracic process.  Stable bibasilar scarring. 3. Stable hiatal hernia. 4. Increased size of the cystic mass off the superior aspect of the pancreatic body, measuring 2.7 x 1.6 cm on this exam. If the patient would be a candidate should neoplasm be detected, repeat dedicated nonemergent outpatient pancreatic MRI with and without contrast could be considered. 5. Aortic Atherosclerosis (ICD10-I70.0). Coronary artery atherosclerosis. Electronically Signed   By: MRanda NgoM.D.   On: 06/06/2022 17:07   NM Pulmonary Perfusion  Result Date: 06/06/2022 CLINICAL DATA:  Elevated D-dimer concern for pulmonary embolus. EXAM: NUCLEAR MEDICINE PERFUSION LUNG SCAN TECHNIQUE: Perfusion images were obtained in multiple projections after intravenous injection of radiopharmaceutical. Ventilation scans intentionally deferred if perfusion scan and chest x-ray adequate  for interpretation during COVID 19 epidemic. RADIOPHARMACEUTICALS:  4.2 mCi Tc-927mAA IV COMPARISON:  Chest radiograph June 05, 2022 and CT chest September 03, 2020. FINDINGS: On frontal and posterior views there is symmetric bilateral radiotracer distribution with findings compatible with cardiomegaly without wedge-shaped perfusion defects. A lateral and oblique views there are few small to moderate wedge-shaped perfusion defects. Additionally there is a linear perfusion defect seen on these views which likely reflects artifact from the right shoulder arthroplasty. IMPRESSION: Scintigraphic findings compatible with intermediate probability of pulmonary embolus. Consider further evaluation with CTA chest if clinically indicated. Electronically Signed   By: JeDahlia Bailiff.D.   On: 06/06/2022 09:05   DG Chest Portable 1 View  Result Date: 06/05/2022 CLINICAL DATA:  Near syncope, short of breath, tachycardia EXAM: PORTABLE CHEST 1 VIEW COMPARISON:  09/03/2020 FINDINGS: Single frontal view of the chest demonstrates an enlarged cardiac silhouette. Stable ectasia and atherosclerosis of the  thoracic aorta. No acute airspace disease, effusion, or pneumothorax. There is chronic atelectasis within the left lower lobe. Stable right shoulder arthroplasty. IMPRESSION: 1. Chronic left lower lobe scarring or atelectasis. 2. No acute airspace disease. Electronically Signed   By: Randa Ngo M.D.   On: 06/05/2022 18:01    Telemetry    Afib, 80-100 - Personally Reviewed  Cardiac Studies   2D Echocardiogram 11.15.2023  1. Left ventricular ejection fraction, by estimation, is 60 to 65%. The  left ventricle has normal function. The left ventricle has no regional  wall motion abnormalities. There is mild left ventricular hypertrophy.  Left ventricular diastolic parameters  are indeterminate.   2. Right ventricular systolic function is normal. The right ventricular  size is normal.   3. Left atrial size was  mildly dilated.   4. The mitral valve is degenerative. Mild mitral valve regurgitation.   5. Tricuspid valve regurgitation is mild to moderate.   6. The aortic valve is calcified. Aortic valve regurgitation is not  visualized. Mild aortic valve stenosis. Aortic valve mean gradient  measures 11.5 mmHg.   7. The inferior vena cava is normal in size with greater than 50%  respiratory variability, suggesting right atrial pressure of 3 mmHg.  Patient Profile     88 y.o. female with a hx of hypertension, gold stage III COPD followed by pulmonary, asthma, hypoxia, osteoarthritis, CKD 3B, and diabetes, who was admitted 11/14 w/ near syncope and found to be in afib w/ RVR.  Assessment & Plan    1.  AFib w/ RVR:  Presented 11/14 w/ near syncope and found to be in rapid afib.  Rates improved on oral dilt.  Echo w/ nl EF.  Feels better this AM.  Breathing close to baseline.  Ambulated w/ PT.  Eager to go home.  CHA2DS2VASc = 5-->eliquis 2.5 BID started in setting of advanced age and creat >1.5.  With rates trending near 100 and ongoing HTN, will titrate dilt to '240mg'$  daily.  F/u in clinic 12/1.  2.  Demand Ischemia:  HsTrop 27  32.  Suspect demand ischemia in setting of #1.  Echo w/ nl EF.  Defer isch eval for now.  Consider outpt MV.  3.  Essential HTN:  Pressures elevated.  Will titrate dilt to '240mg'$  daily.  4.  CKD IIIb:  Creat stable @ 1.52.  5.  COPD/Asthma:  No wheezing, per IM.  6.  DMII:  Per IM.  7.  Pancreatic Mass:  Increased size of the cystic mass off the superior aspect of the pancreatic body, measuring 2.7 x 1.6 cm on CT.  Per IM.  Will need outpt f/u.  Signed, Murray Hodgkins, NP  06/07/2022, 11:59 AM    For questions or updates, please contact   Please consult [email or web address] for contact info under Cardiology/STEMI.

## 2022-06-07 NOTE — Evaluation (Signed)
Physical Therapy Evaluation Patient Details Name: Leslie Parker MRN: [id number] DOB: 08-07-1932 Today's Date: 06/07/2022  History of Present Illness  88 y.o. female with medical history significant for HTN, stage III COPD, followed by pulmonology, last seen 10/4, CKD 3B, diabetes, who presents to the ED by EMS following an near syncopal episode while at the [employer].  She did not fall but was helped down.  The episode was preceded with dizziness and shortness of breath but no chest pain.  EMS found her in Afib.  Clinical Impression  Pt pleasant and eager to work with PT.  Ultimately she did well, easily getting up to the EOB and donning underwear, rising to standing w/o hesitation or need for UE support.  She was able to circumambulate the nurses' station with walker on room air.  Some fatigue with this but O2 remained in the 90s and HR did not increase more than the low 120s.  She had no overt safety issues, apart from some fatigue she reports feeling close to baseline.  Good overall tolerance and safe to go home, recommending HHPT but pt unsure if she wants services.      Recommendations for follow up therapy are one component of a multi-disciplinary discharge planning process, led by the attending physician.  Recommendations may be updated based on patient status, additional functional criteria and insurance authorization.  Follow Up Recommendations Home health PT      Assistance Recommended at Discharge Intermittent Supervision/Assistance  Patient can return home with the following  Assist for transportation;Assistance with cooking/housework    Equipment Recommendations None recommended by PT  Recommendations for Other Services       Functional Status Assessment Patient has had a recent decline in their functional status and demonstrates the ability to make significant improvements in function in a reasonable and predictable amount of time.     Precautions / Restrictions  Precautions Precautions: Fall Restrictions Weight Bearing Restrictions: No      Mobility  Bed Mobility Overal bed mobility: Modified Independent Bed Mobility: Supine to Sit     Supine to sit: Modified independent (Device/Increase time)     General bed mobility comments: Pt able to easily get up to sitting w/o assist    Transfers Overall transfer level: Modified independent Equipment used: None, Rolling walker (2 wheels)               General transfer comment: Pt able to rise to standing without hesitation and showed good confidence with standing with and w/o AD    Ambulation/Gait Ambulation/Gait assistance: Supervision Gait Distance (Feet): 200 Feet Assistive device: Rolling walker (2 wheels)         General Gait Details: Pt with some fatigue circumambulating the nurses station. HR rose minimally to ~120 but stayed relatively stable with the effort, O2 on room air slowly drops from high 90s to low 90s with some SOB but again stayed within expected and appropriate levels.  Stairs            Wheelchair Mobility    Modified Rankin (Stroke Patients Only)       Balance Overall balance assessment: Modified Independent                                           Pertinent Vitals/Pain Pain Assessment Pain Assessment: No/denies pain    Home Living Family/patient expects to be discharged to::  Private residence Living Arrangements: Alone Available Help at Discharge: Available PRN/intermittently;Friend(s);Neighbor;Family   Home Access: Level entry       Home Layout: One level Home Equipment: Rollator (4 wheels);Cane - single point;Grab bars - tub/shower;Grab bars - toilet      Prior Function Prior Level of Function : Independent/Modified Independent             Mobility Comments: Pt reports she still drives and runs errands, but that often daughter will come on weekends and take her for longer days out running errands,etc        Hand Dominance        Extremity/Trunk Assessment   Upper Extremity Assessment Upper Extremity Assessment: Overall WFL for tasks assessed;Generalized weakness    Lower Extremity Assessment Lower Extremity Assessment: Overall WFL for tasks assessed;Generalized weakness       Communication   Communication: No difficulties  Cognition Arousal/Alertness: Awake/alert Behavior During Therapy: WFL for tasks assessed/performed Overall Cognitive Status: Within Functional Limits for tasks assessed                                          General Comments      Exercises     Assessment/Plan    PT Assessment Patient needs continued PT services  PT Problem List Decreased strength;Decreased activity tolerance;Cardiopulmonary status limiting activity;Decreased balance       PT Treatment Interventions DME instruction;Gait training;Therapeutic activities;Functional mobility training;Therapeutic exercise;Balance training;Neuromuscular re-education;Patient/family education    PT Goals (Current goals can be found in the Care Plan section)  Acute Rehab PT Goals Patient Stated Goal: go home today PT Goal Formulation: With patient Time For Goal Achievement: 06/20/22 Potential to Achieve Goals: Good    Frequency Min 2X/week     Co-evaluation               AM-PAC PT "6 Clicks" Mobility  Outcome Measure Help needed turning from your back to your side while in a flat bed without using bedrails?: None Help needed moving from lying on your back to sitting on the side of a flat bed without using bedrails?: None Help needed moving to and from a bed to a chair (including a wheelchair)?: None Help needed standing up from a chair using your arms (e.g., wheelchair or bedside chair)?: None Help needed to walk in hospital room?: None Help needed climbing 3-5 steps with a railing? : A Little 6 Click Score: 23    End of Session Equipment Utilized During Treatment: Gait  belt Activity Tolerance: Patient limited by fatigue Patient left: with call bell/phone within reach;with chair alarm set Nurse Communication: Mobility status PT Visit Diagnosis: Muscle weakness (generalized) (M62.81);Difficulty in walking, not elsewhere classified (R26.2)    Time: 7673-4193 PT Time Calculation (min) (ACUTE ONLY): 24 min   Charges:   PT Evaluation $PT Eval Low Complexity: 1 Low PT Treatments $Gait Training: 8-22 mins        Kreg Shropshire, DPT 06/07/2022, 9:38 AM

## 2022-06-07 NOTE — Telephone Encounter (Signed)
Pharmacy Patient Advocate Encounter  Insurance verification completed.    The patient is insured through [facility]   The patient is currently admitted and ran test claims for the following: Eliquis 2.'5mg'$ .  Copays and coinsurance results were relayed to Inpatient clinical team.

## 2022-06-07 NOTE — Evaluation (Signed)
Occupational Therapy Evaluation Patient Details Name: Leslie Parker MRN: [id number] DOB: 1933-05-29 Today's Date: 06/07/2022   History of Present Illness Pt is an 88 year old female presenting to the ED following an near syncopal episode [place] the [employer], admitted with afib; PMH significant for HTN, stage III COPD, followed by pulmonology, last seen 10/4, CKD 3B, diabetes   Clinical Impression   Chart reviewed, pt greeted in chair finishing PT evaluation. Pt is alert and oriented x4, agreeable to OT evaluation. PTA pt is MOD I-I in ADL/IADL. Pt presents with mild deficits in strength and activity tolerance/endurance on this date affecting safe and optimal ADL completion. Pt si able to amb in room, perform toilet transfer, grooming tasks with RW with supervision. Good safety awareness. Pt educated on energy conservation techniques, safe DME use at home, falls prevention. Recommend HHOT following discharge, OT will continue to follow acutely.      Recommendations for follow up therapy are one component of a multi-disciplinary discharge planning process, led by the attending physician.  Recommendations may be updated based on patient status, additional functional criteria and insurance authorization.   Follow Up Recommendations  Home health OT     Assistance Recommended at Discharge Intermittent Supervision/Assistance  Patient can return home with the following A little help with walking and/or transfers;A little help with bathing/dressing/bathroom;Assistance with cooking/housework    Functional Status Assessment  Patient has had a recent decline in their functional status and demonstrates the ability to make significant improvements in function in a reasonable and predictable amount of time.  Equipment Recommendations  None recommended by OT    Recommendations for Other Services       Precautions / Restrictions Precautions Precautions: Fall Restrictions Weight Bearing  Restrictions: No      Mobility Bed Mobility               General bed mobility comments: NT in recliner pre/post session    Transfers Overall transfer level: Needs assistance Equipment used: Rolling walker (2 wheels) Transfers: Sit to/from Stand Sit to Stand: Supervision                  Balance Overall balance assessment: Needs assistance Sitting-balance support: Feet supported Sitting balance-[name]: Good     Standing balance support: Bilateral upper extremity supported, Reliant on assistive device for balance, During functional activity Standing balance-[name]: Good                             ADL either performed or assessed with clinical judgement   ADL Overall ADL's : Needs assistance/impaired Eating/Feeding: Set up;Sitting   Grooming: Wash/dry face;Sitting;Set up                   Toilet Transfer: Supervision/safety;Rolling walker (2 wheels);Ambulation;Regular Toilet           Functional mobility during ADLs: Supervision/safety;Rolling walker (2 wheels) (household distances in room)       Vision Patient Visual Report: No change from baseline       Perception     Praxis      Pertinent Vitals/Pain Pain Assessment Pain Assessment: No/denies pain     Hand Dominance     Extremity/Trunk Assessment Upper Extremity Assessment Upper Extremity Assessment: Overall WFL for tasks assessed   Lower Extremity Assessment Lower Extremity Assessment: Generalized weakness       Communication Communication Communication: No difficulties   Cognition Arousal/Alertness: Awake/alert Behavior During  Therapy: WFL for tasks assessed/performed Overall Cognitive Status: Within Functional Limits for tasks assessed                                       General Comments       Exercises     Shoulder Instructions      Home Living Family/patient expects to be discharged to:: Private residence Living  Arrangements: Alone Available Help at Discharge: Available PRN/intermittently;Friend(s);Neighbor;Family Type of Home: Apartment Home Access: Level entry     Home Layout: One level     Bathroom Shower/Tub: Tub/shower unit         Home Equipment: Rollator (4 wheels);Cane - single point;Grab bars - tub/shower;Grab bars - toilet          Prior Functioning/Environment Prior Level of Function : Independent/Modified Independent             Mobility Comments: Pt reports she still drives and runs errands, but that often daughter will come on weekends and take her for longer days out running errands,etc ADLs Comments: pt reports generally MOD I for ADL/IADL, cooks, cleans, drives        OT Problem List: Decreased strength;Decreased activity tolerance      OT Treatment/Interventions: Self-care/ADL training;Therapeutic activities;Therapeutic exercise;Patient/family education    OT Goals(Current goals can be found in the care plan section) Acute Rehab OT Goals Patient Stated Goal: go home OT Goal Formulation: With patient Time For Goal Achievement: 06/21/22 Potential to Achieve Goals: Good ADL Goals Pt Will Perform Grooming: with modified independence Pt Will Perform Lower Body Dressing: with modified independence Pt Will Transfer to Toilet: with modified independence;ambulating Pt Will Perform Toileting - Clothing Manipulation and hygiene: with modified independence;sit to/from stand  OT Frequency: Min 2X/week    Co-evaluation              AM-PAC OT "6 Clicks" Daily Activity     Outcome Measure Help from another person eating meals?: None Help from another person taking care of personal grooming?: None Help from another person toileting, which includes using toliet, bedpan, or urinal?: None Help from another person bathing (including washing, rinsing, drying)?: A Little Help from another person to put on and taking off regular upper body clothing?: None Help from  another person to put on and taking off regular lower body clothing?: A Little 6 Click Score: 22   End of Session Equipment Utilized During Treatment: Rolling walker (2 wheels) Nurse Communication: Mobility status  Activity Tolerance: Patient tolerated treatment well Patient left: in chair;with call bell/phone within reach;with chair alarm set  OT Visit Diagnosis: Unsteadiness on feet (R26.81)                Time: 0093-8182 OT Time Calculation (min): 14 min Charges:  OT General Charges $OT Visit: 1 Visit OT Evaluation $OT Eval Low Complexity: 1 Low Shanon Payor, OTD OTR/L  06/07/22, 11:29 AM

## 2022-06-08 ENCOUNTER — Telehealth: Payer: Self-pay | Admitting: Cardiology

## 2022-06-08 NOTE — Telephone Encounter (Signed)
Patient states she was discharged yesterday from the hospital.  She was given a script for blood thinners, [facility] didn't have it in stock, they are calling around to see if they can get it for her.  She called [place] to let us know.

## 2022-06-22 ENCOUNTER — Ambulatory Visit: Payer: Medicare HMO | Admitting: Cardiology

## 2022-07-05 DIAGNOSIS — I10 Essential (primary) hypertension: Secondary | ICD-10-CM | POA: Diagnosis not present

## 2022-07-05 DIAGNOSIS — E119 Type 2 diabetes mellitus without complications: Secondary | ICD-10-CM | POA: Diagnosis not present

## 2022-07-05 DIAGNOSIS — J449 Chronic obstructive pulmonary disease, unspecified: Secondary | ICD-10-CM | POA: Diagnosis not present

## 2022-07-05 DIAGNOSIS — I4891 Unspecified atrial fibrillation: Secondary | ICD-10-CM | POA: Diagnosis not present

## 2022-07-05 DIAGNOSIS — N1832 Chronic kidney disease, stage 3b: Secondary | ICD-10-CM | POA: Diagnosis not present

## 2022-07-10 DIAGNOSIS — J449 Chronic obstructive pulmonary disease, unspecified: Secondary | ICD-10-CM | POA: Diagnosis not present

## 2022-07-30 DIAGNOSIS — I4891 Unspecified atrial fibrillation: Secondary | ICD-10-CM | POA: Diagnosis not present

## 2022-08-01 DIAGNOSIS — I6523 Occlusion and stenosis of bilateral carotid arteries: Secondary | ICD-10-CM | POA: Diagnosis not present

## 2022-08-01 DIAGNOSIS — I4891 Unspecified atrial fibrillation: Secondary | ICD-10-CM | POA: Diagnosis not present

## 2022-08-01 DIAGNOSIS — E119 Type 2 diabetes mellitus without complications: Secondary | ICD-10-CM | POA: Diagnosis not present

## 2022-08-01 DIAGNOSIS — I668 Occlusion and stenosis of other cerebral arteries: Secondary | ICD-10-CM | POA: Diagnosis not present

## 2022-08-01 DIAGNOSIS — I10 Essential (primary) hypertension: Secondary | ICD-10-CM | POA: Diagnosis not present

## 2022-08-22 ENCOUNTER — Other Ambulatory Visit: Payer: Self-pay

## 2022-08-22 ENCOUNTER — Inpatient Hospital Stay
Admission: EM | Admit: 2022-08-22 | Discharge: 2022-08-27 | DRG: 190 | Disposition: A | Discharge status: Home / Self Care | Payer: Medicare HMO | Attending: Internal Medicine | Admitting: Internal Medicine

## 2022-08-22 ENCOUNTER — Emergency Department: Payer: Medicare HMO

## 2022-08-22 DIAGNOSIS — I10 Essential (primary) hypertension: Secondary | ICD-10-CM | POA: Diagnosis present

## 2022-08-22 DIAGNOSIS — Z90710 Acquired absence of both cervix and uterus: Secondary | ICD-10-CM

## 2022-08-22 DIAGNOSIS — E041 Nontoxic single thyroid nodule: Secondary | ICD-10-CM | POA: Diagnosis present

## 2022-08-22 DIAGNOSIS — E119 Type 2 diabetes mellitus without complications: Secondary | ICD-10-CM

## 2022-08-22 DIAGNOSIS — J9601 Acute respiratory failure with hypoxia: Secondary | ICD-10-CM | POA: Diagnosis not present

## 2022-08-22 DIAGNOSIS — I130 Hypertensive heart and chronic kidney disease with heart failure and stage 1 through stage 4 chronic kidney disease, or unspecified chronic kidney disease: Secondary | ICD-10-CM | POA: Diagnosis not present

## 2022-08-22 DIAGNOSIS — R0602 Shortness of breath: Secondary | ICD-10-CM | POA: Diagnosis present

## 2022-08-22 DIAGNOSIS — Z1152 Encounter for screening for COVID-19: Secondary | ICD-10-CM

## 2022-08-22 DIAGNOSIS — I272 Pulmonary hypertension, unspecified: Secondary | ICD-10-CM | POA: Diagnosis present

## 2022-08-22 DIAGNOSIS — I350 Nonrheumatic aortic (valve) stenosis: Secondary | ICD-10-CM | POA: Diagnosis present

## 2022-08-22 DIAGNOSIS — M1990 Unspecified osteoarthritis, unspecified site: Secondary | ICD-10-CM | POA: Diagnosis present

## 2022-08-22 DIAGNOSIS — I4892 Unspecified atrial flutter: Secondary | ICD-10-CM | POA: Diagnosis present

## 2022-08-22 DIAGNOSIS — Z79899 Other long term (current) drug therapy: Secondary | ICD-10-CM

## 2022-08-22 DIAGNOSIS — Z961 Presence of intraocular lens: Secondary | ICD-10-CM | POA: Diagnosis present

## 2022-08-22 DIAGNOSIS — J440 Chronic obstructive pulmonary disease with acute lower respiratory infection: Principal | ICD-10-CM | POA: Diagnosis present

## 2022-08-22 DIAGNOSIS — I482 Chronic atrial fibrillation, unspecified: Secondary | ICD-10-CM | POA: Diagnosis present

## 2022-08-22 DIAGNOSIS — I5033 Acute on chronic diastolic (congestive) heart failure: Secondary | ICD-10-CM | POA: Diagnosis not present

## 2022-08-22 DIAGNOSIS — Z7902 Long term (current) use of antithrombotics/antiplatelets: Secondary | ICD-10-CM

## 2022-08-22 DIAGNOSIS — J441 Chronic obstructive pulmonary disease with (acute) exacerbation: Principal | ICD-10-CM | POA: Diagnosis present

## 2022-08-22 DIAGNOSIS — R000 Tachycardia, unspecified: Secondary | ICD-10-CM | POA: Diagnosis not present

## 2022-08-22 DIAGNOSIS — E1122 Type 2 diabetes mellitus with diabetic chronic kidney disease: Secondary | ICD-10-CM | POA: Diagnosis not present

## 2022-08-22 DIAGNOSIS — Z9842 Cataract extraction status, left eye: Secondary | ICD-10-CM

## 2022-08-22 DIAGNOSIS — M8580 Other specified disorders of bone density and structure, unspecified site: Secondary | ICD-10-CM | POA: Diagnosis present

## 2022-08-22 DIAGNOSIS — Z96611 Presence of right artificial shoulder joint: Secondary | ICD-10-CM | POA: Diagnosis present

## 2022-08-22 DIAGNOSIS — J45901 Unspecified asthma with (acute) exacerbation: Secondary | ICD-10-CM | POA: Diagnosis not present

## 2022-08-22 DIAGNOSIS — E872 Acidosis, unspecified: Secondary | ICD-10-CM | POA: Diagnosis not present

## 2022-08-22 DIAGNOSIS — E1165 Type 2 diabetes mellitus with hyperglycemia: Secondary | ICD-10-CM | POA: Diagnosis present

## 2022-08-22 DIAGNOSIS — Z9049 Acquired absence of other specified parts of digestive tract: Secondary | ICD-10-CM

## 2022-08-22 DIAGNOSIS — Z888 Allergy status to other drugs, medicaments and biological substances status: Secondary | ICD-10-CM

## 2022-08-22 DIAGNOSIS — R0603 Acute respiratory distress: Secondary | ICD-10-CM | POA: Diagnosis present

## 2022-08-22 DIAGNOSIS — J449 Chronic obstructive pulmonary disease, unspecified: Secondary | ICD-10-CM | POA: Diagnosis present

## 2022-08-22 DIAGNOSIS — Z7901 Long term (current) use of anticoagulants: Secondary | ICD-10-CM | POA: Diagnosis not present

## 2022-08-22 DIAGNOSIS — I4821 Permanent atrial fibrillation: Secondary | ICD-10-CM | POA: Diagnosis present

## 2022-08-22 DIAGNOSIS — Z85828 Personal history of other malignant neoplasm of skin: Secondary | ICD-10-CM | POA: Diagnosis not present

## 2022-08-22 DIAGNOSIS — J439 Emphysema, unspecified: Secondary | ICD-10-CM | POA: Diagnosis not present

## 2022-08-22 DIAGNOSIS — Z9841 Cataract extraction status, right eye: Secondary | ICD-10-CM | POA: Diagnosis not present

## 2022-08-22 DIAGNOSIS — N1832 Chronic kidney disease, stage 3b: Secondary | ICD-10-CM | POA: Diagnosis present

## 2022-08-22 DIAGNOSIS — Z96642 Presence of left artificial hip joint: Secondary | ICD-10-CM | POA: Diagnosis present

## 2022-08-22 DIAGNOSIS — J9811 Atelectasis: Secondary | ICD-10-CM | POA: Diagnosis not present

## 2022-08-22 DIAGNOSIS — R5381 Other malaise: Secondary | ICD-10-CM | POA: Diagnosis present

## 2022-08-22 DIAGNOSIS — Z96653 Presence of artificial knee joint, bilateral: Secondary | ICD-10-CM | POA: Diagnosis present

## 2022-08-22 HISTORY — DX: Unspecified atrial fibrillation: I48.91

## 2022-08-22 LAB — BLOOD GAS, VENOUS
Acid-base deficit: 1.3 mmol/L (ref 0.0–2.0)
Bicarbonate: 24.3 mmol/L (ref 20.0–28.0)
O2 Saturation: 64.5 %
Patient temperature: 37
pCO2, Ven: 43 mmHg — ABNORMAL LOW (ref 44–60)
pH, Ven: 7.36 (ref 7.25–7.43)
pO2, Ven: 38 mmHg (ref 32–45)

## 2022-08-22 LAB — CBC WITH DIFFERENTIAL/PLATELET
Abs Immature Granulocytes: 0.04 10*3/uL (ref 0.00–0.07)
Basophils Absolute: 0 10*3/uL (ref 0.0–0.1)
Basophils Relative: 1 %
Eosinophils Absolute: 0.3 10*3/uL (ref 0.0–0.5)
Eosinophils Relative: 3 %
HCT: 36.7 % (ref 36.0–46.0)
Hemoglobin: 12.3 g/dL (ref 12.0–15.0)
Immature Granulocytes: 1 %
Lymphocytes Relative: 33 %
Lymphs Abs: 2.8 10*3/uL (ref 0.7–4.0)
MCH: 30.1 pg (ref 26.0–34.0)
MCHC: 33.5 g/dL (ref 30.0–36.0)
MCV: 89.7 fL (ref 80.0–100.0)
Monocytes Absolute: 0.9 10*3/uL (ref 0.1–1.0)
Monocytes Relative: 11 %
Neutro Abs: 4.5 10*3/uL (ref 1.7–7.7)
Neutrophils Relative %: 51 %
Platelets: 274 10*3/uL (ref 150–400)
RBC: 4.09 MIL/uL (ref 3.87–5.11)
RDW: 13.1 % (ref 11.5–15.5)
WBC: 8.5 10*3/uL (ref 4.0–10.5)
nRBC: 0 % (ref 0.0–0.2)

## 2022-08-22 LAB — RESP PANEL BY RT-PCR (RSV, FLU A&B, COVID)  RVPGX2
Influenza A by PCR: NEGATIVE
Influenza B by PCR: NEGATIVE
Resp Syncytial Virus by PCR: NEGATIVE
SARS Coronavirus 2 by RT PCR: NEGATIVE

## 2022-08-22 LAB — LACTIC ACID, PLASMA
Lactic Acid, Venous: 2.5 mmol/L (ref 0.5–1.9)
Lactic Acid, Venous: 2.3 mmol/L (ref 0.5–1.9)

## 2022-08-22 LAB — BASIC METABOLIC PANEL
Anion gap: 10 (ref 5–15)
BUN: 32 mg/dL — ABNORMAL HIGH (ref 8–23)
CO2: 23 mmol/L (ref 22–32)
Calcium: 8.4 mg/dL — ABNORMAL LOW (ref 8.9–10.3)
Chloride: 104 mmol/L (ref 98–111)
Creatinine, Ser: 1.55 mg/dL — ABNORMAL HIGH (ref 0.44–1.00)
GFR, Estimated: 32 mL/min — ABNORMAL LOW (ref >60)
Glucose, Bld: 282 mg/dL — ABNORMAL HIGH (ref 70–99)
Potassium: 3.6 mmol/L (ref 3.5–5.1)
Sodium: 137 mmol/L (ref 135–145)

## 2022-08-22 LAB — TROPONIN I (HIGH SENSITIVITY)
Troponin I (High Sensitivity): 20 ng/L — ABNORMAL HIGH (ref <18)
Troponin I (High Sensitivity): 19 ng/L — ABNORMAL HIGH (ref <18)

## 2022-08-22 LAB — PROCALCITONIN: Procalcitonin: <0.1 ng/mL

## 2022-08-22 LAB — BRAIN NATRIURETIC PEPTIDE: B Natriuretic Peptide: 451.3 pg/mL — ABNORMAL HIGH (ref 0.0–100.0)

## 2022-08-22 MED ORDER — ONDANSETRON HCL 4 MG PO TABS: 4.0000 mg | ORAL_TABLET | Freq: Four times a day (QID) | ORAL | Status: DC | PRN

## 2022-08-22 MED ORDER — FLUTICASONE FUROATE-VILANTEROL 100-25 MCG/ACT IN AEPB: 1.0000 | INHALATION_SPRAY | Freq: Every day | RESPIRATORY_TRACT | Status: DC

## 2022-08-22 MED ORDER — ALBUTEROL SULFATE (2.5 MG/3ML) 0.083% IN NEBU: 3.0000 mL | INHALATION_SOLUTION | Freq: Four times a day (QID) | RESPIRATORY_TRACT | Status: DC | PRN

## 2022-08-22 MED ORDER — SODIUM CHLORIDE 0.9 % IV SOLN
2.0000 g | Freq: Once | INTRAVENOUS | Status: AC
  Administered 2022-08-22: 2 g via INTRAVENOUS
  Filled 2022-08-22: qty 12.5

## 2022-08-22 MED ORDER — ACETAMINOPHEN 650 MG RE SUPP: 650.0000 mg | Freq: Four times a day (QID) | RECTAL | Status: DC | PRN

## 2022-08-22 MED ORDER — UMECLIDINIUM BROMIDE 62.5 MCG/ACT IN AEPB: 1.0000 | INHALATION_SPRAY | Freq: Every day | RESPIRATORY_TRACT | Status: DC

## 2022-08-22 MED ORDER — DILTIAZEM HCL ER COATED BEADS 240 MG PO CP24
240.0000 mg | ORAL_CAPSULE | Freq: Every day | ORAL | Status: DC
  Administered 2022-08-23 – 2022-08-27 (×5): 240 mg via ORAL
  Filled 2022-08-22 (×5): qty 1

## 2022-08-22 MED ORDER — ONDANSETRON HCL 4 MG/2ML IJ SOLN
4.0000 mg | Freq: Four times a day (QID) | INTRAMUSCULAR | Status: DC | PRN
  Administered 2022-08-23: 4 mg via INTRAVENOUS
  Filled 2022-08-22: qty 2

## 2022-08-22 MED ORDER — VITAMIN B-12 1000 MCG PO TABS
2500.0000 ug | ORAL_TABLET | Freq: Every day | ORAL | Status: DC
  Administered 2022-08-23 – 2022-08-27 (×5): 2500 ug via ORAL
  Filled 2022-08-22 (×4): qty 3
  Filled 2022-08-22: qty 2.5

## 2022-08-22 MED ORDER — ACETAMINOPHEN 325 MG PO TABS: 650.0000 mg | ORAL_TABLET | Freq: Four times a day (QID) | ORAL | Status: DC | PRN

## 2022-08-22 MED ORDER — APIXABAN 2.5 MG PO TABS
2.5000 mg | ORAL_TABLET | Freq: Two times a day (BID) | ORAL | Status: DC
  Administered 2022-08-22 – 2022-08-27 (×10): 2.5 mg via ORAL
  Filled 2022-08-22 (×10): qty 1

## 2022-08-22 MED ORDER — ADULT MULTIVITAMIN W/MINERALS CH
1.0000 | ORAL_TABLET | Freq: Every day | ORAL | Status: DC
  Administered 2022-08-23 – 2022-08-27 (×5): 1 via ORAL
  Filled 2022-08-22 (×5): qty 1

## 2022-08-22 MED ORDER — SODIUM CHLORIDE 0.9 % IV SOLN
500.0000 mg | INTRAVENOUS | Status: AC
  Administered 2022-08-23 – 2022-08-26 (×4): 500 mg via INTRAVENOUS
  Filled 2022-08-22 (×4): qty 500

## 2022-08-22 MED ORDER — BISACODYL 5 MG PO TBEC: 5.0000 mg | DELAYED_RELEASE_TABLET | Freq: Every day | ORAL | Status: DC | PRN

## 2022-08-22 MED ORDER — IPRATROPIUM-ALBUTEROL 0.5-2.5 (3) MG/3ML IN SOLN
3.0000 mL | Freq: Four times a day (QID) | RESPIRATORY_TRACT | Status: DC | PRN
  Administered 2022-08-23 – 2022-08-24 (×2): 3 mL via RESPIRATORY_TRACT
  Filled 2022-08-22 (×2): qty 3

## 2022-08-22 MED ORDER — COLESTIPOL HCL 1 G PO TABS
1.0000 g | ORAL_TABLET | Freq: Two times a day (BID) | ORAL | Status: DC
  Administered 2022-08-23 – 2022-08-27 (×9): 1 g via ORAL
  Filled 2022-08-22 (×9): qty 1

## 2022-08-22 MED ORDER — SODIUM CHLORIDE 0.9 % IV SOLN
500.0000 mg | Freq: Once | INTRAVENOUS | Status: AC
  Administered 2022-08-22: 500 mg via INTRAVENOUS
  Filled 2022-08-22: qty 5

## 2022-08-22 MED ORDER — ALBUTEROL SULFATE (2.5 MG/3ML) 0.083% IN NEBU
10.0000 mg | INHALATION_SOLUTION | Freq: Once | RESPIRATORY_TRACT | Status: AC
  Administered 2022-08-22: 10 mg via RESPIRATORY_TRACT
  Filled 2022-08-22: qty 12

## 2022-08-22 MED ORDER — FUROSEMIDE 10 MG/ML IJ SOLN
20.0000 mg | Freq: Once | INTRAMUSCULAR | Status: AC
  Administered 2022-08-22: 20 mg via INTRAVENOUS
  Filled 2022-08-22: qty 4

## 2022-08-22 MED ORDER — MAGNESIUM OXIDE -MG SUPPLEMENT 400 (240 MG) MG PO TABS
400.0000 mg | ORAL_TABLET | Freq: Every day | ORAL | Status: DC
  Administered 2022-08-23 – 2022-08-27 (×5): 400 mg via ORAL
  Filled 2022-08-22 (×5): qty 1

## 2022-08-22 MED ORDER — IPRATROPIUM-ALBUTEROL 0.5-2.5 (3) MG/3ML IN SOLN
3.0000 mL | Freq: Once | RESPIRATORY_TRACT | Status: AC
  Administered 2022-08-22: 3 mL via RESPIRATORY_TRACT
  Filled 2022-08-22: qty 3

## 2022-08-22 MED ORDER — POLYETHYLENE GLYCOL 3350 17 G PO PACK: 17.0000 g | PACK | Freq: Every day | ORAL | Status: DC | PRN

## 2022-08-22 MED ORDER — DOCUSATE SODIUM 100 MG PO CAPS
100.0000 mg | ORAL_CAPSULE | Freq: Two times a day (BID) | ORAL | Status: DC
  Administered 2022-08-23 – 2022-08-27 (×9): 100 mg via ORAL
  Filled 2022-08-22 (×9): qty 1

## 2022-08-22 MED ORDER — VITAMIN C 500 MG PO TABS
250.0000 mg | ORAL_TABLET | Freq: Every day | ORAL | Status: DC
  Administered 2022-08-23 – 2022-08-27 (×5): 250 mg via ORAL
  Filled 2022-08-22 (×5): qty 1

## 2022-08-22 MED ORDER — LACTATED RINGERS IV SOLN: INTRAVENOUS | Status: DC

## 2022-08-22 NOTE — Assessment & Plan Note (Signed)
Supportive care with supplemental oxygen .

## 2022-08-22 NOTE — ED Triage Notes (Signed)
EMS called by EMS for shortness of breath; Patient reports that she has had a "cold" for the last 5 days; Reports body aches, congestion, cough, and chills; Upon EMS arrival, she had audible inspiratory and expiratory wheezing, was given Solu-Medrol 125 mg IV, Mg 2 g IV, Albuterol x 2 nebs, and Duoneb x 1; Patient reports improvement with her breathing after medications

## 2022-08-22 NOTE — Assessment & Plan Note (Addendum)
?  If related to infection or hypoperfusion or nebulizer therapy. We have to identify with help with pcp upon discharge.

## 2022-08-22 NOTE — Assessment & Plan Note (Signed)
A.fib chronic on EKG as well.  Cont eliquis.  Cont diltiazem.

## 2022-08-22 NOTE — ED Provider Notes (Signed)
[facility] Provider Note    Event Date/Time   First MD Initiated Contact with Patient 08/22/22 2010     (approximate)   History   Shortness of Breath (EMS called by EMS for shortness of breath; Patient reports that she has had a "cold" for the last 5 days; Reports body aches, congestion, cough, and chills; Upon EMS arrival, she had audible inspiratory and expiratory wheezing, was given Solu-Medrol 125 mg IV, Mg 2 g IV, Albuterol x 2 nebs, and Duoneb x 1; Patient reports improvement with her breathing after medications)   HPI  Leslie Parker is a 89 y.o. female who presents to the ER for evaluation of shortness of breath.  She does not wear oxygen at home.  [place] the beach and started to have a cold like symptoms of the past 5 days generalized malaise cough chills.  EMS was called because her work of breathing became acutely worse.  Found to be wheezing hypoxic.  Given Solu-Medrol, IV mag, albuterol.  She is starting to feel some improvement.     Physical Exam   Triage Vital Signs: ED Triage Vitals  Enc Vitals Group     BP      Pulse      Resp      Temp      Temp src      SpO2      Weight      Height      Head Circumference      Peak Flow      Pain Score      Pain Loc      Pain Edu?      Excl. in [place]?     Most recent vital signs: Vitals:   08/22/22 2023 08/22/22 2100  BP:  (!) 146/77  Pulse:  (!) 106  Resp:  (!) 28  Temp:    SpO2: 100% 100%     Constitutional: Alert  Eyes: Conjunctivae are normal.  Head: Atraumatic. Nose: No congestion/rhinnorhea. Mouth/Throat: Mucous membranes are moist.   Neck: Painless ROM.  Cardiovascular:   Good peripheral circulation. Respiratory: Mild tachypnea with diffuse expiratory wheeze and prolonged expiratory phase. Gastrointestinal: Soft and nontender.  Musculoskeletal:  no deformity Neurologic:  MAE spontaneously. No gross focal neurologic deficits are appreciated.  Skin:  Skin  is warm, dry and intact. No rash noted. Psychiatric: Mood and affect are normal. Speech and behavior are normal.    ED Results / Procedures / Treatments   Labs (all labs ordered are listed, but only abnormal results are displayed) Labs Reviewed  BASIC METABOLIC PANEL - Abnormal; Notable for the following components:      Result Value   Glucose, Bld 282 (*)    BUN 32 (*)    Creatinine, Ser 1.55 (*)    Calcium 8.4 (*)    GFR, Estimated 32 (*)    All other components within normal limits  LACTIC ACID, PLASMA - Abnormal; Notable for the following components:   Lactic Acid, Venous 2.5 (*)    All other components within normal limits  LACTIC ACID, PLASMA - Abnormal; Notable for the following components:   Lactic Acid, Venous 2.3 (*)    All other components within normal limits  BLOOD GAS, VENOUS - Abnormal; Notable for the following components:   pCO2, Ven 43 (*)    All other components within normal limits  BRAIN NATRIURETIC PEPTIDE - Abnormal; Notable for the following components:   B  Natriuretic Peptide 451.3 (*)    All other components within normal limits  TROPONIN I (HIGH SENSITIVITY) - Abnormal; Notable for the following components:   Troponin I (High Sensitivity) 20 (*)    All other components within normal limits  TROPONIN I (HIGH SENSITIVITY) - Abnormal; Notable for the following components:   Troponin I (High Sensitivity) 19 (*)    All other components within normal limits  RESP PANEL BY RT-PCR (RSV, FLU A&B, COVID)  [id number]  CBC WITH DIFFERENTIAL/PLATELET  PROCALCITONIN  PROTIME-INR  CORTISOL-AM, BLOOD  COMPREHENSIVE METABOLIC PANEL  CBC     EKG  ED ECG REPORT I, Merlyn Lot, the attending physician, personally viewed and interpreted this ECG.   Date: 08/22/2022  EKG Time: 20:09  Rate: 110  Rhythm: afib  Axis: normal  Intervals:normal  ST&T Change: no stemi, no depressions    RADIOLOGY Please see ED Course for my review and interpretation.  I  personally reviewed all radiographic images ordered to evaluate for the above acute complaints and reviewed radiology reports and findings.  These findings were personally discussed with the patient.  Please see medical record for radiology report.    PROCEDURES:  Critical Care performed: Yes, see critical care procedure note(s)  Procedures   MEDICATIONS ORDERED IN ED: Medications  azithromycin (ZITHROMAX) 500 mg in sodium chloride 0.9 % 250 mL IVPB (has no administration in time range)  albuterol (PROVENTIL) (2.5 MG/3ML) 0.083% nebulizer solution 3 mL (has no administration in time range)  apixaban (ELIQUIS) tablet 2.5 mg (2.5 mg Oral Given 08/22/22 2310)  cyanocobalamin (VITAMIN B12) tablet 2,500 mcg (has no administration in time range)  colestipol (COLESTID) tablet 1 g (has no administration in time range)  diltiazem (CARDIZEM CD) 24 hr capsule 240 mg (has no administration in time range)  fluticasone furoate-vilanterol (BREO ELLIPTA) 100-25 MCG/ACT 1 puff (has no administration in time range)    And  umeclidinium bromide (INCRUSE ELLIPTA) 62.5 MCG/ACT 1 puff (has no administration in time range)  ipratropium-albuterol (DUONEB) 0.5-2.5 (3) MG/3ML nebulizer solution 3 mL (has no administration in time range)  ascorbic acid (VITAMIN C) tablet 250 mg (has no administration in time range)  magnesium oxide (MAG-OX) tablet 400 mg (has no administration in time range)  multivitamin with minerals tablet 1 tablet (has no administration in time range)  lactated ringers infusion ( Intravenous New Bag/Given 08/22/22 2309)  acetaminophen (TYLENOL) tablet 650 mg (has no administration in time range)    Or  acetaminophen (TYLENOL) suppository 650 mg (has no administration in time range)  docusate sodium (COLACE) capsule 100 mg (100 mg Oral Not Given 08/22/22 2303)  polyethylene glycol (MIRALAX / GLYCOLAX) packet 17 g (has no administration in time range)  bisacodyl (DULCOLAX) EC tablet 5 mg (has no  administration in time range)  ondansetron (ZOFRAN) tablet 4 mg (has no administration in time range)    Or  ondansetron (ZOFRAN) injection 4 mg (has no administration in time range)  albuterol (PROVENTIL) (2.5 MG/3ML) 0.083% nebulizer solution 10 mg (10 mg Nebulization Given 08/22/22 2022)  ceFEPIme (MAXIPIME) 2 g in sodium chloride 0.9 % 100 mL IVPB (0 g Intravenous Stopped 08/22/22 2209)  azithromycin (ZITHROMAX) 500 mg in sodium chloride 0.9 % 250 mL IVPB (0 mg Intravenous Stopped 08/22/22 2213)  ipratropium-albuterol (DUONEB) 0.5-2.5 (3) MG/3ML nebulizer solution 3 mL (3 mLs Nebulization Given 08/22/22 2310)  furosemide (LASIX) injection 20 mg (20 mg Intravenous Given 08/22/22 2309)     IMPRESSION / MDM / ASSESSMENT AND PLAN /  ED COURSE  I reviewed the triage vital signs and the nursing notes.                              Differential diagnosis includes, but is not limited to, Asthma, copd, CHF, pna, ptx, malignancy, Pe, anemia  Patient presenting to the ER for evaluation of symptoms as described above.  Based on symptoms, risk factors and considered above differential, this presenting complaint could reflect a potentially life-threatening illness therefore the patient will be placed on continuous pulse oximetry and telemetry for monitoring.  Laboratory evaluation will be sent to evaluate for the above complaints.      Clinical Course as of 08/22/22 2339  Wed Aug 22, 2022  2121 No acidosis but does have lactic acidemia.  This may be secondary to respiratory strain or hypoxia.  Chest x-ray without infiltrate my review and interpretation but given findings of SIRS criteria with acute hypoxia respiratory failure will require hospitalization.  The viral panel is negative.  At this point she does appear stable and appropriate for admission to the hospital. [PR]    Clinical Course User Index [PR] Merlyn Lot, MD      FINAL CLINICAL IMPRESSION(S) / ED DIAGNOSES   Final diagnoses:   COPD exacerbation ([place])  Acute respiratory failure with hypoxia ([place])     Rx / DC Orders   ED Discharge Orders     None        Note:  This document was prepared using Dragon voice recognition software and may include unintentional dictation errors.    Merlyn Lot, MD 08/22/22 [phone]

## 2022-08-22 NOTE — Assessment & Plan Note (Signed)
Per pt no tobacco ever.  Cont prn nebs and cont with solumedrol therapy.  Supplemental oxygen.

## 2022-08-22 NOTE — ED Notes (Signed)
Pt alert, states breathing better.  Rt in with pt and placed on oxygen

## 2022-08-22 NOTE — Assessment & Plan Note (Signed)
Vitals:   08/22/22 2012 08/22/22 2100  BP: (!) 160/77 (!) 146/77  Cont diltiazem .

## 2022-08-22 NOTE — Consult Note (Signed)
PHARMACY -  BRIEF ANTIBIOTIC NOTE   Pharmacy has received consult(s) for Cefepime from an ED provider.  The patient's profile has been reviewed for ht/wt/allergies/indication/available labs.    One time order(s) placed for Cefepime 2gm x 1  Further antibiotics/pharmacy consults should be ordered by admitting physician if indicated.                       Thank you, Nicholson Starace Rodriguez-Guzman PharmD, BCPS 08/22/2022 9:49 PM

## 2022-08-22 NOTE — ED Notes (Signed)
Report off to rachael rn  cpod nurse.

## 2022-08-22 NOTE — H&P (Signed)
History and Physical    Chief Complaint: Respiratory distress.    HISTORY OF PRESENT ILLNESS: Leslie Parker is an 89 y.o. female  seen in ed  in respiratory distress.  Pt was thought to be septic. and was started on iv abx regimen.  Patient states that she had a cough and congestion myalgias and cough fevers chills for the past 5 days or so coughing, patient has no history of tobacco abuse. Has no history of COPD. ED started patient on Solu-Medrol 125 mg, 2 g of magnesium IV, MDI 2, DuoNeb x 1. Last echo was in 2023 which didn't ot define any Reduced EF or heart failure.  We will repeat a 2 D Echo.    Pt has  Past Medical History:  Diagnosis Date   Asthma    Cancer ([place])    Basal Cell   Diabetes mellitus without complication ([place])    Heart murmur    Hypertension    Impetigo    Osteoarthritis    Osteopenia    Vomiting    can not due to surgery   Wears dentures    full upper and lower     Review of Systems  Constitutional:  Positive for chills, fatigue and fever.  Respiratory:  Positive for shortness of breath and wheezing.   Cardiovascular:  Positive for palpitations and leg swelling.  Neurological:  Positive for weakness.  All other systems reviewed and are negative.  Allergies  Allergen Reactions   Baclofen Other (See Comments)    Elevated Cr   Past Surgical History:  Procedure Laterality Date   ABDOMINAL HYSTERECTOMY     BACK SURGERY     BLADDER SURGERY     mesh   CARPAL TUNNEL RELEASE Bilateral    CATARACT EXTRACTION Right 2017   CATARACT EXTRACTION W/PHACO Left 02/06/2016   Procedure: CATARACT EXTRACTION PHACO AND INTRAOCULAR LENS PLACEMENT ([place]) left eye;  Surgeon: Ronnell Freshwater, MD;  Location: [place];  Service: Ophthalmology;  Laterality: Left;  DIABETIC LEFT Cannot arrive before 9:30   CERVICAL DISC SURGERY     CHOLECYSTECTOMY     EYE SURGERY Bilateral    Cataract Extraction with IOL   FEMUR IM NAIL Left 02/04/2015    Procedure: INTRAMEDULLARY (IM) NAIL FEMORAL;  Surgeon: Thornton Park, MD;  Location: [facility];  Service: Orthopedics;  Laterality: Left;   HARDWARE REMOVAL Left 01/17/2017   Procedure: HARDWARE REMOVAL;  Surgeon: Thornton Park, MD;  Location: [facility];  Service: Orthopedics;  Laterality: Left;   HERNIA REPAIR  2014   esophageal and gastric mesh. patient unable to throw up d/t mesh   HIP ARTHROPLASTY Left 01/26/2017   Procedure: ARTHROPLASTY BIPOLAR HIP (HEMIARTHROPLASTY) removal hardware left hip;  Surgeon: Earnestine Leys, MD;  Location: [facility];  Service: Orthopedics;  Laterality: Left;   JOINT REPLACEMENT Bilateral    knees   REPLACEMENT TOTAL KNEE BILATERAL Bilateral 2007,2008   SHOULDER ARTHROSCOPY WITH OPEN ROTATOR CUFF REPAIR AND DISTAL CLAVICLE ACROMINECTOMY Left 10/25/2016   Procedure: SHOULDER ARTHROSCOPY WITH OPEN ROTATOR CUFF REPAIR AND DISTAL CLAVICLE ACROMINECTOMY;  Surgeon: Thornton Park, MD;  Location: [facility];  Service: Orthopedics;  Laterality: Left;   THYROID SURGERY     goiter removed   TOTAL HIP REVISION Left 12/02/2017   Procedure: TOTAL HIP REVISION;  Surgeon: Lovell Sheehan, MD;  Location: [facility];  Service: Orthopedics;  Laterality: Left;   TOTAL SHOULDER REPLACEMENT Right 2012       MEDICATIONS: Current Outpatient Medications  Medication Instructions   albuterol (VENTOLIN HFA) 108 (90 Base) MCG/ACT inhaler 2 puffs, Inhalation, Every 6 hours PRN   apixaban (ELIQUIS) 2.5 mg, Oral, 2 times daily   ascorbic acid (VITAMIN C) 250 mg, Oral, Daily   B-12 2,500 mcg, Oral, Daily   cholecalciferol (VITAMIN D3) 1,000 Units, Daily   colestipol (COLESTID) 1 g tablet 1 tablet, Oral, 2 times daily   diltiazem (CARDIZEM CD) 240 mg, Oral, Daily   fluticasone-salmeterol (ADVAIR) 250-50 MCG/ACT AEPB 1 puff, 2 times daily   furosemide (LASIX) 20 mg, Oral, Daily   glipiZIDE (GLUCOTROL XL) 2.5 mg, Oral, Daily with breakfast   ipratropium-albuterol (DUONEB) 0.5-2.5 (3) MG/3ML  SOLN 3 mLs, Inhalation, Every 6 hours PRN   Magnesium Oxide -Mg Supplement 500 mg, Daily   Multiple Vitamin (MULTIVITAMIN WITH MINERALS) TABS tablet 1 tablet, Oral, Daily, One-A-Day Women's Vitamin    omeprazole (PRILOSEC) 20 mg, Daily   predniSONE (DELTASONE) 20 mg, Daily with breakfast   tiZANidine (ZANAFLEX) 4 mg, Daily   traZODone (DESYREL) 100 mg, Oral, Daily at bedtime   TRELEGY ELLIPTA 100-62.5-25 MCG/ACT AEPB 1 puff, Inhalation, Daily     apixaban  2.5 mg Oral BID   [START ON 08/23/2022] ascorbic acid  250 mg Oral Daily   [START ON 08/23/2022] colestipol  1 g Oral BID   [START ON 08/23/2022] cyanocobalamin  2,500 mcg Oral Daily   [START ON 08/23/2022] diltiazem  240 mg Oral Daily   docusate sodium  100 mg Oral BID   [START ON 08/23/2022] fluticasone furoate-vilanterol  1 puff Inhalation Daily   And   [START ON 08/23/2022] umeclidinium bromide  1 puff Inhalation Daily   furosemide  20 mg Intravenous Once   ipratropium-albuterol  3 mL Nebulization Once   [START ON 08/23/2022] magnesium oxide  400 mg Oral Daily   [START ON 08/23/2022] multivitamin with minerals  1 tablet Oral Daily    ED Course: Pt in Ed is alert, but ill appearing and congested and coughing 91%.  Vitals:   08/22/22 2012 08/22/22 2014 08/22/22 2023 08/22/22 2100  BP: (!) 160/77   (!) 146/77  Pulse: (!) 105   (!) 106  Resp: (!) 24   (!) 28  Temp: 97.9 F (36.6 C)     TempSrc: Oral     SpO2: 100%  100% 100%  Weight:  83.1 kg    Height:  '5\' 9"'$  (1.753 m)      No intake/output data recorded. SpO2: 100 % O2 Flow Rate (L/min): 2 L/min Blood work in ed shows: Results for orders placed or performed during the hospital encounter of 08/22/22 (from the past 24 hour(s))  CBC with Differential     Status: None   Collection Time: 08/22/22  8:25 PM  Result Value Ref Range   WBC 8.5 4.0 - 10.5 K/uL   RBC 4.09 3.87 - 5.11 MIL/uL   Hemoglobin 12.3 12.0 - 15.0 g/dL   HCT 36.7 36.0 - 46.0 %   MCV 89.7 80.0 - 100.0 fL   MCH 30.1  26.0 - 34.0 pg   MCHC 33.5 30.0 - 36.0 g/dL   RDW 13.1 11.5 - 15.5 %   Platelets 274 150 - 400 K/uL   nRBC 0.0 0.0 - 0.2 %   Neutrophils Relative % 51 %   Neutro Abs 4.5 1.7 - 7.7 K/uL   Lymphocytes Relative 33 %   Lymphs Abs 2.8 0.7 - 4.0 K/uL   Monocytes Relative 11 %   Monocytes Absolute 0.9  0.1 - 1.0 K/uL   Eosinophils Relative 3 %   Eosinophils Absolute 0.3 0.0 - 0.5 K/uL   Basophils Relative 1 %   Basophils Absolute 0.0 0.0 - 0.1 K/uL   Immature Granulocytes 1 %   Abs Immature Granulocytes 0.04 0.00 - 0.07 K/uL  Basic metabolic panel     Status: Abnormal   Collection Time: 08/22/22  8:25 PM  Result Value Ref Range   Sodium 137 135 - 145 mmol/L   Potassium 3.6 3.5 - 5.1 mmol/L   Chloride 104 98 - 111 mmol/L   CO2 23 22 - 32 mmol/L   Glucose, Bld 282 (H) 70 - 99 mg/dL   BUN 32 (H) 8 - 23 mg/dL   Creatinine, Ser 1.55 (H) 0.44 - 1.00 mg/dL   Calcium 8.4 (L) 8.9 - 10.3 mg/dL   GFR, Estimated 32 (L) >60 mL/min   Anion gap 10 5 - 15  Resp panel by RT-PCR (RSV, Flu A&B, Covid) Anterior Nasal Swab     Status: None   Collection Time: 08/22/22  8:25 PM   Specimen: Anterior Nasal Swab  Result Value Ref Range   SARS Coronavirus 2 by RT PCR NEGATIVE NEGATIVE   Influenza A by PCR NEGATIVE NEGATIVE   Influenza B by PCR NEGATIVE NEGATIVE   Resp Syncytial Virus by PCR NEGATIVE NEGATIVE  Lactic acid, plasma     Status: Abnormal   Collection Time: 08/22/22  8:25 PM  Result Value Ref Range   Lactic Acid, Venous 2.5 (HH) 0.5 - 1.9 mmol/L  Troponin I (High Sensitivity)     Status: Abnormal   Collection Time: 08/22/22  8:25 PM  Result Value Ref Range   Troponin I (High Sensitivity) 20 (H) <18 ng/L  Blood gas, venous     Status: Abnormal   Collection Time: 08/22/22  8:25 PM  Result Value Ref Range   pH, Ven 7.36 7.25 - 7.43   pCO2, Ven 43 (L) 44 - 60 mmHg   pO2, Ven 38 32 - 45 mmHg   Bicarbonate 24.3 20.0 - 28.0 mmol/L   Acid-base deficit 1.3 0.0 - 2.0 mmol/L   O2 Saturation 64.5 %    Patient temperature 37.0    Collection site VEIN   Brain natriuretic peptide     Status: Abnormal   Collection Time: 08/22/22  8:25 PM  Result Value Ref Range   B Natriuretic Peptide 451.3 (H) 0.0 - 100.0 pg/mL  Lactic acid, plasma     Status: Abnormal   Collection Time: 08/22/22 10:15 PM  Result Value Ref Range   Lactic Acid, Venous 2.3 (HH) 0.5 - 1.9 mmol/L  Troponin I (High Sensitivity)     Status: Abnormal   Collection Time: 08/22/22 10:15 PM  Result Value Ref Range   Troponin I (High Sensitivity) 19 (H) <18 ng/L    Unresulted Labs (From admission, onward)     Start     Ordered   08/23/22 0500  Protime-INR  Tomorrow morning,   STAT        08/22/22 2153   08/23/22 0500  Cortisol-am, blood  Tomorrow morning,   URGENT        08/22/22 2153   08/23/22 0500  Comprehensive metabolic panel  Tomorrow morning,   STAT        08/22/22 2153   08/23/22 0500  CBC  Tomorrow morning,   STAT        08/22/22 2153   08/22/22 2135  Procalcitonin - Baseline  ONCE -  URGENT,   URGENT        08/22/22 2135           Pt has received : Orders Placed This Encounter  Procedures   Resp panel by RT-PCR (RSV, Flu A&B, Covid) Anterior Nasal Swab    Standing Status:   Standing    Number of Occurrences:   1   DG Chest Portable 1 View    Standing Status:   Standing    Number of Occurrences:   1    Order Specific Question:   Reason for Exam (SYMPTOM  OR DIAGNOSIS REQUIRED)    Answer:   Shortness of Breath   CBC with Differential    Standing Status:   Standing    Number of Occurrences:   1   Basic metabolic panel    Standing Status:   Standing    Number of Occurrences:   1   Lactic acid, plasma    Standing Status:   Standing    Number of Occurrences:   2   Blood gas, venous    Standing Status:   Standing    Number of Occurrences:   1   Procalcitonin - Baseline    Standing Status:   Standing    Number of Occurrences:   1   Brain natriuretic peptide    Standing Status:   Standing     Number of Occurrences:   1   Protime-INR    Standing Status:   Standing    Number of Occurrences:   1   Cortisol-am, blood    Standing Status:   Standing    Number of Occurrences:   1   Comprehensive metabolic panel    Standing Status:   Standing    Number of Occurrences:   1   CBC    Standing Status:   Standing    Number of Occurrences:   1   Diet heart healthy/carb modified Room service appropriate? Yes; Fluid consistency: Thin    Standing Status:   Standing    Number of Occurrences:   1    Order Specific Question:   Diet-HS Snack?    Answer:   Nothing    Order Specific Question:   Room service appropriate?    Answer:   Yes    Order Specific Question:   Fluid consistency:    Answer:   Thin   DO NOT delay antibiotics if unable to obtain blood culture.    Standing Status:   Standing    Number of Occurrences:   1   Cardiac Monitoring Continuous x 24 hours Indications for use: Other; other indications for use: Sepsis    Standing Status:   Standing    Number of Occurrences:   1    Order Specific Question:   Indications for use:    Answer:   Other    Order Specific Question:   other indications for use:    Answer:   Sepsis   Vital signs    Standing Status:   Standing    Number of Occurrences:   1   Notify physician (specify)    Standing Status:   Standing    Number of Occurrences:   20    Order Specific Question:   Notify Physician    Answer:   for pulse less than 55 or greater than 120    Order Specific Question:   Notify Physician    Answer:   for respiratory rate less than 12  or greater than 25    Order Specific Question:   Notify Physician    Answer:   for temperature greater than 100.5 F    Order Specific Question:   Notify Physician    Answer:   for urinary output less than 30 mL/hr for four hours    Order Specific Question:   Notify Physician    Answer:   for systolic BP less than 90 or greater than 580, diastolic BP less than 60 or greater than 100    Order  Specific Question:   Notify Physician    Answer:   for new hypoxia w/ oxygen saturations < 88%   Progressive Mobility Protocol: No Restrictions    Standing Status:   Standing    Number of Occurrences:   1   If lactate (lactic acid) >2, verify repeat lactic acid order has been placed to be drawn    Standing Status:   Standing    Number of Occurrences:   1   Document vital signs within 1-hour of fluid bolus completion and notify provider of bolus completion    Standing Status:   Standing    Number of Occurrences:   1   Vital signs    Standing Status:   Standing    Number of Occurrences:   1   Vital signs    Standing Status:   Standing    Number of Occurrences:   1   RN to call RRT (rapid response team)    Standing Status:   Standing    Number of Occurrences:   1   Notify physician (specify) If patient in A-Fib, change in heart rhythm, or HR > 125 beat/min    If patient in A-Fib, change in heart rhythm, or HR > 125 beat/min    Standing Status:   Standing    Number of Occurrences:   1   Apply Sepsis Care Plan    Standing Status:   Standing    Number of Occurrences:   1   Refer to Sidebar Report: Sepsis Bundle ED/IP    Sepsis Bundle ED/IP    Standing Status:   Standing    Number of Occurrences:   1   Assess and Document Glasgow Coma Scale    Standing Status:   Standing    Number of Occurrences:   1   Initiate Oral Care Protocol    Standing Status:   Standing    Number of Occurrences:   1   Initiate Carrier Fluid Protocol    Standing Status:   Standing    Number of Occurrences:   1   RN may order General Admission PRN Orders utilizing "General Admission PRN medications" (through manage orders) for the following patient needs: allergy symptoms (Claritin), cold sores (Carmex), cough (Robitussin DM), eye irritation (Liquifilm Tears), hemorrhoids (Tucks), indigestion (Maalox), minor skin irritation (Hydrocortisone Cream), muscle pain Suezanne Jacquet Gay), nose irritation (saline nasal spray) and  sore throat (Chloraseptic spray).    Standing Status:   Standing    Number of Occurrences:   [phone]   Strict intake and output    Standing Status:   Standing    Number of Occurrences:   1   Full code    Standing Status:   Standing    Number of Occurrences:   1    Order Specific Question:   By:    Answer:   Other   Consult to hospitalist    Standing Status:   Standing  Number of Occurrences:   1    Order Specific Question:   Place call to:    Answer:   [phone]    Order Specific Question:   Reason for Consult    Answer:   Admit   Pulse oximetry check with vital signs    Standing Status:   Standing    Number of Occurrences:   1   Pulse oximetry (single)    Standing Status:   Standing    Number of Occurrences:   99999   Oxygen therapy Mode or (Route): Nasal cannula; Liters Per Minute: 2; Keep 02 saturation: greater than 92 %    Standing Status:   Standing    Number of Occurrences:   20    Order Specific Question:   Mode or (Route)    Answer:   Nasal cannula    Order Specific Question:   Liters Per Minute    Answer:   2    Order Specific Question:   Keep 02 saturation    Answer:   greater than 92 %   EKG 12-Lead    Standing Status:   Standing    Number of Occurrences:   1   Place in observation (patient's expected length of stay will be less than 2 midnights)    Standing Status:   Standing    Number of Occurrences:   1    Order Specific Question:   Hospital Area    Answer:   [place] [100120]    Order Specific Question:   Level of Care    Answer:   Telemetry Medical [104]    Order Specific Question:   Covid Evaluation    Answer:   Asymptomatic - no recent exposure (last 10 days) testing not required    Order Specific Question:   Diagnosis    Answer:   Respiratory distress [478295]    Order Specific Question:   Admitting Physician    Answer:   Cherylann Ratel    Order Specific Question:   Attending Physician    Answer:   Cherylann Ratel   Aspiration precautions    Standing Status:   Standing    Number of Occurrences:   1    Meds ordered this encounter  Medications   albuterol (PROVENTIL) (2.5 MG/3ML) 0.083% nebulizer solution 10 mg   ceFEPIme (MAXIPIME) 2 g in sodium chloride 0.9 % 100 mL IVPB    Order Specific Question:   Antibiotic Indication:    Answer:   Other Indication (list below)    Order Specific Question:   Other Indication:    Answer:   CAP   azithromycin (ZITHROMAX) 500 mg in sodium chloride 0.9 % 250 mL IVPB    Order Specific Question:   Antibiotic Indication:    Answer:   CAP   azithromycin (ZITHROMAX) 500 mg in sodium chloride 0.9 % 250 mL IVPB    Order Specific Question:   Antibiotic Indication:    Answer:   CAP   albuterol (PROVENTIL) (2.5 MG/3ML) 0.083% nebulizer solution 3 mL   apixaban (ELIQUIS) tablet 2.5 mg   cyanocobalamin (VITAMIN B12) tablet 2,500 mcg   colestipol (COLESTID) tablet 1 g   diltiazem (CARDIZEM CD) 24 hr capsule 240 mg   AND Linked Order Group    fluticasone furoate-vilanterol (BREO ELLIPTA) 100-25 MCG/ACT 1 puff    umeclidinium bromide (INCRUSE ELLIPTA) 62.5 MCG/ACT 1 puff   ipratropium-albuterol (DUONEB) 0.5-2.5 (3) MG/3ML nebulizer solution 3  mL   ascorbic acid (VITAMIN C) tablet 250 mg   magnesium oxide (MAG-OX) tablet 400 mg   multivitamin with minerals tablet 1 tablet    One-A-Day Women's Vitamin     lactated ringers infusion   OR Linked Order Group    acetaminophen (TYLENOL) tablet 650 mg    acetaminophen (TYLENOL) suppository 650 mg   docusate sodium (COLACE) capsule 100 mg   polyethylene glycol (MIRALAX / GLYCOLAX) packet 17 g   bisacodyl (DULCOLAX) EC tablet 5 mg   OR Linked Order Group    ondansetron (ZOFRAN) tablet 4 mg    ondansetron (ZOFRAN) injection 4 mg   ipratropium-albuterol (DUONEB) 0.5-2.5 (3) MG/3ML nebulizer solution 3 mL   furosemide (LASIX) injection 20 mg     Admission Imaging : DG Chest Portable 1 View  Result Date:  08/22/2022 CLINICAL DATA:  Shortness of breath EXAM: PORTABLE CHEST 1 VIEW COMPARISON:  06/05/2022 FINDINGS: Cardiac shadow is stable. Aortic calcifications are seen. The lungs are well aerated bilaterally. Scarring is noted in the left base stable from the prior exam. Postsurgical changes in the right shoulder are noted. IMPRESSION: No active disease. Electronically Signed   By: Inez Catalina M.D.   On: 08/22/2022 21:01      Physical Examination: Vitals:   08/22/22 2012 08/22/22 2014 08/22/22 2023 08/22/22 2100  BP: (!) 160/77   (!) 146/77  Pulse: (!) 105   (!) 106  Temp: 97.9 F (36.6 C)     Resp: (!) 24   (!) 28  Height:  '5\' 9"'$  (1.753 m)    Weight:  83.1 kg    SpO2: 100%  100% 100%  TempSrc: Oral     BMI (Calculated):  27.06     Physical Exam Vitals and nursing note reviewed.  Constitutional:      General: She is not in acute distress.    Appearance: Normal appearance. She is not ill-appearing, toxic-appearing or diaphoretic.  HENT:     Head: Normocephalic and atraumatic.     Right Ear: Hearing and external ear normal.     Left Ear: Hearing and external ear normal.     Nose: Nose normal. No nasal deformity.     Mouth/Throat:     Lips: Pink.     Mouth: Mucous membranes are moist.     Tongue: No lesions.     Pharynx: Oropharynx is clear.  Eyes:     Extraocular Movements: Extraocular movements intact.     Pupils: Pupils are equal, round, and reactive to light.  Neck:     Vascular: No carotid bruit.  Cardiovascular:     Rate and Rhythm: Tachycardia present. Rhythm irregular.     Pulses: Normal pulses.     Heart sounds: Normal heart sounds.  Pulmonary:     Breath sounds: Rales present.  Abdominal:     General: Bowel sounds are normal. There is no distension.     Palpations: Abdomen is soft. There is no mass.     Tenderness: There is no abdominal tenderness. There is no guarding.     Hernia: No hernia is present.  Musculoskeletal:     Right lower leg: Edema present.      Left lower leg: Edema present.  Skin:    General: Skin is warm.  Neurological:     General: No focal deficit present.     Mental Status: She is alert and oriented to person, place, and time.     Cranial Nerves: Cranial nerves 2-12 are  intact.     Motor: Motor function is intact.  Psychiatric:        Attention and Perception: Attention normal.        Mood and Affect: Mood normal.        Speech: Speech normal.        Behavior: Behavior normal. Behavior is cooperative.        Cognition and Memory: Cognition normal.       Assessment and Plan: * Respiratory distress Suspect 2/2 PNA/ COPD/ bronchitis / CHF exacerbation. Pt has received solumedrol neds and iv abx.  We will give single dose of lasix with strict  I/O to gauge output.   Stage 3b chronic kidney disease ([place]) Lab Results  Component Value Date   CREATININE 1.55 (H) 08/22/2022   CREATININE 1.52 (H) 06/06/2022   CREATININE 1.76 (H) 06/06/2022   Stable to improved.  Follow avoid contrast.   Shortness of breath Supportive care with supplemental oxygen .  Chronic a-fib (HCC) A.fib chronic on EKG as well.  Cont eliquis.  Cont diltiazem.    Lactic acidosis ? If related to infection or hypoperfusion or nebulizer therapy. We have to identify with help with pcp upon discharge.    Chronic obstructive lung disease ([place]) Per pt no tobacco ever.  Cont prn nebs and cont with solumedrol therapy.  Supplemental oxygen.   Diabetes mellitus without complication (HCC) Glycemic protocol hold metformin and other po home meds. .   Hypertension Vitals:   08/22/22 2012 08/22/22 2100  BP: (!) 160/77 (!) 146/77  Cont diltiazem .      DVT prophylaxis:  Eliquis.    Code Status:  Full code   Family Communication:  Windy Carina (Granddaughter) [phone]   Disposition Plan:  Home   Consults called:  None  Admission status: Observation   Unit/ Expected LOS: Med tele. / 1-2 days.    Para Skeans MD [facility]  6 PM- 2 AM. Please contact me via secure Chat 6 PM-2 AM. [phone]( Pager ) To contact the [facility] Attending or Consulting provider [place] or covering provider during after hours [place], for this patient.   Check the care team in [facility] and look for a) attending/consulting [facility] provider listed and b) the [facility] team listed Log into [email or web address] and use 's universal password to access. If you do not have the password, please contact the hospital operator. Locate the [facility] provider you are looking for under [facility] and page to a number that you can be directly reached. If you still have difficulty reaching the provider, please page the [facility] (Director on Call) for the Hospitalists listed on amion for assistance. [email or web address] 08/22/2022, 10:52 PM

## 2022-08-22 NOTE — Assessment & Plan Note (Signed)
Suspect 2/2 PNA/ COPD/ bronchitis / CHF exacerbation. Pt has received solumedrol neds and iv abx.  We will give single dose of lasix with strict  I/O to gauge output.

## 2022-08-22 NOTE — Hospital Course (Signed)
COPD exacerbation. Respiratory distress/ cold/ recently travel from [place].  No fever or chill. Tachycardic/ tachyonic. Lactic Acid +. On eliquis.

## 2022-08-22 NOTE — Assessment & Plan Note (Signed)
Lab Results  Component Value Date   CREATININE 1.55 (H) 08/22/2022   CREATININE 1.52 (H) 06/06/2022   CREATININE 1.76 (H) 06/06/2022   Stable to improved.  Follow avoid contrast.

## 2022-08-22 NOTE — Assessment & Plan Note (Signed)
Glycemic protocol hold metformin and other po home meds. Marland Kitchen

## 2022-08-23 ENCOUNTER — Observation Stay: Payer: Medicare HMO

## 2022-08-23 ENCOUNTER — Encounter: Payer: Self-pay | Admitting: Internal Medicine

## 2022-08-23 DIAGNOSIS — Z1152 Encounter for screening for COVID-19: Secondary | ICD-10-CM | POA: Diagnosis not present

## 2022-08-23 DIAGNOSIS — E1165 Type 2 diabetes mellitus with hyperglycemia: Secondary | ICD-10-CM | POA: Diagnosis present

## 2022-08-23 DIAGNOSIS — J440 Chronic obstructive pulmonary disease with acute lower respiratory infection: Secondary | ICD-10-CM | POA: Diagnosis present

## 2022-08-23 DIAGNOSIS — I5033 Acute on chronic diastolic (congestive) heart failure: Secondary | ICD-10-CM | POA: Diagnosis present

## 2022-08-23 DIAGNOSIS — J449 Chronic obstructive pulmonary disease, unspecified: Secondary | ICD-10-CM | POA: Diagnosis present

## 2022-08-23 DIAGNOSIS — J9811 Atelectasis: Secondary | ICD-10-CM | POA: Diagnosis not present

## 2022-08-23 DIAGNOSIS — I4821 Permanent atrial fibrillation: Secondary | ICD-10-CM | POA: Diagnosis present

## 2022-08-23 DIAGNOSIS — J441 Chronic obstructive pulmonary disease with (acute) exacerbation: Principal | ICD-10-CM | POA: Diagnosis present

## 2022-08-23 DIAGNOSIS — E041 Nontoxic single thyroid nodule: Secondary | ICD-10-CM | POA: Diagnosis present

## 2022-08-23 DIAGNOSIS — M1990 Unspecified osteoarthritis, unspecified site: Secondary | ICD-10-CM | POA: Diagnosis present

## 2022-08-23 DIAGNOSIS — I130 Hypertensive heart and chronic kidney disease with heart failure and stage 1 through stage 4 chronic kidney disease, or unspecified chronic kidney disease: Secondary | ICD-10-CM | POA: Diagnosis present

## 2022-08-23 DIAGNOSIS — I350 Nonrheumatic aortic (valve) stenosis: Secondary | ICD-10-CM | POA: Diagnosis present

## 2022-08-23 DIAGNOSIS — R0603 Acute respiratory distress: Secondary | ICD-10-CM | POA: Diagnosis present

## 2022-08-23 DIAGNOSIS — Z90710 Acquired absence of both cervix and uterus: Secondary | ICD-10-CM | POA: Diagnosis not present

## 2022-08-23 DIAGNOSIS — J439 Emphysema, unspecified: Secondary | ICD-10-CM | POA: Diagnosis not present

## 2022-08-23 DIAGNOSIS — Z85828 Personal history of other malignant neoplasm of skin: Secondary | ICD-10-CM | POA: Diagnosis not present

## 2022-08-23 DIAGNOSIS — Z888 Allergy status to other drugs, medicaments and biological substances status: Secondary | ICD-10-CM | POA: Diagnosis not present

## 2022-08-23 DIAGNOSIS — E1122 Type 2 diabetes mellitus with diabetic chronic kidney disease: Secondary | ICD-10-CM | POA: Diagnosis present

## 2022-08-23 DIAGNOSIS — Z9842 Cataract extraction status, left eye: Secondary | ICD-10-CM | POA: Diagnosis not present

## 2022-08-23 DIAGNOSIS — Z9841 Cataract extraction status, right eye: Secondary | ICD-10-CM | POA: Diagnosis not present

## 2022-08-23 DIAGNOSIS — E872 Acidosis, unspecified: Secondary | ICD-10-CM | POA: Diagnosis present

## 2022-08-23 DIAGNOSIS — J45901 Unspecified asthma with (acute) exacerbation: Secondary | ICD-10-CM | POA: Diagnosis present

## 2022-08-23 DIAGNOSIS — I272 Pulmonary hypertension, unspecified: Secondary | ICD-10-CM | POA: Diagnosis present

## 2022-08-23 DIAGNOSIS — I482 Chronic atrial fibrillation, unspecified: Secondary | ICD-10-CM | POA: Diagnosis not present

## 2022-08-23 DIAGNOSIS — I4892 Unspecified atrial flutter: Secondary | ICD-10-CM | POA: Diagnosis present

## 2022-08-23 DIAGNOSIS — N1832 Chronic kidney disease, stage 3b: Secondary | ICD-10-CM | POA: Diagnosis present

## 2022-08-23 DIAGNOSIS — Z7901 Long term (current) use of anticoagulants: Secondary | ICD-10-CM | POA: Diagnosis not present

## 2022-08-23 DIAGNOSIS — Z961 Presence of intraocular lens: Secondary | ICD-10-CM | POA: Diagnosis present

## 2022-08-23 LAB — COMPREHENSIVE METABOLIC PANEL
ALT: 23 U/L (ref 0–44)
AST: 26 U/L (ref 15–41)
Albumin: 3.1 g/dL — ABNORMAL LOW (ref 3.5–5.0)
Alkaline Phosphatase: 56 U/L (ref 38–126)
Anion gap: 9 (ref 5–15)
BUN: 32 mg/dL — ABNORMAL HIGH (ref 8–23)
CO2: 22 mmol/L (ref 22–32)
Calcium: 8.1 mg/dL — ABNORMAL LOW (ref 8.9–10.3)
Chloride: 106 mmol/L (ref 98–111)
Creatinine, Ser: 1.51 mg/dL — ABNORMAL HIGH (ref 0.44–1.00)
GFR, Estimated: 33 mL/min — ABNORMAL LOW (ref >60)
Glucose, Bld: 345 mg/dL — ABNORMAL HIGH (ref 70–99)
Potassium: 3.6 mmol/L (ref 3.5–5.1)
Sodium: 137 mmol/L (ref 135–145)
Total Bilirubin: 0.6 mg/dL (ref 0.3–1.2)
Total Protein: 6.2 g/dL — ABNORMAL LOW (ref 6.5–8.1)

## 2022-08-23 LAB — CBC
HCT: 33.9 % — ABNORMAL LOW (ref 36.0–46.0)
Hemoglobin: 11.5 g/dL — ABNORMAL LOW (ref 12.0–15.0)
MCH: 30.6 pg (ref 26.0–34.0)
MCHC: 33.9 g/dL (ref 30.0–36.0)
MCV: 90.2 fL (ref 80.0–100.0)
Platelets: 241 10*3/uL (ref 150–400)
RBC: 3.76 MIL/uL — ABNORMAL LOW (ref 3.87–5.11)
RDW: 12.8 % (ref 11.5–15.5)
WBC: 7 10*3/uL (ref 4.0–10.5)
nRBC: 0 % (ref 0.0–0.2)

## 2022-08-23 LAB — RESPIRATORY PANEL BY PCR

## 2022-08-23 LAB — GLUCOSE, CAPILLARY
Glucose-Capillary: 208 mg/dL — ABNORMAL HIGH (ref 70–99)
Glucose-Capillary: 253 mg/dL — ABNORMAL HIGH (ref 70–99)

## 2022-08-23 LAB — PROTIME-INR
INR: 1.4 — ABNORMAL HIGH (ref 0.8–1.2)
Prothrombin Time: 17.1 seconds — ABNORMAL HIGH (ref 11.4–15.2)

## 2022-08-23 LAB — CBG MONITORING, ED: Glucose-Capillary: 323 mg/dL — ABNORMAL HIGH (ref 70–99)

## 2022-08-23 LAB — CORTISOL-AM, BLOOD: Cortisol - AM: 7.3 ug/dL (ref 6.7–22.6)

## 2022-08-23 MED ORDER — INSULIN ASPART 100 UNIT/ML IJ SOLN
0.0000 [IU] | Freq: Three times a day (TID) | INTRAMUSCULAR | Status: DC
  Administered 2022-08-23: 11 [IU] via SUBCUTANEOUS
  Administered 2022-08-23 – 2022-08-24 (×2): 5 [IU] via SUBCUTANEOUS
  Administered 2022-08-24: 11 [IU] via SUBCUTANEOUS
  Administered 2022-08-24: 5 [IU] via SUBCUTANEOUS
  Administered 2022-08-25: 8 [IU] via SUBCUTANEOUS
  Administered 2022-08-25: 3 [IU] via SUBCUTANEOUS
  Administered 2022-08-25: 2 [IU] via SUBCUTANEOUS
  Administered 2022-08-26: 3 [IU] via SUBCUTANEOUS
  Administered 2022-08-26 (×2): 5 [IU] via SUBCUTANEOUS
  Administered 2022-08-27: 3 [IU] via SUBCUTANEOUS
  Administered 2022-08-27: 5 [IU] via SUBCUTANEOUS
  Filled 2022-08-23 (×13): qty 1

## 2022-08-23 MED ORDER — UMECLIDINIUM BROMIDE 62.5 MCG/ACT IN AEPB
1.0000 | INHALATION_SPRAY | Freq: Every day | RESPIRATORY_TRACT | Status: DC
  Administered 2022-08-24 – 2022-08-27 (×4): 1 via RESPIRATORY_TRACT
  Filled 2022-08-23: qty 7

## 2022-08-23 MED ORDER — FLUTICASONE FUROATE-VILANTEROL 100-25 MCG/ACT IN AEPB
1.0000 | INHALATION_SPRAY | Freq: Every day | RESPIRATORY_TRACT | Status: DC
  Administered 2022-08-24 – 2022-08-27 (×4): 1 via RESPIRATORY_TRACT
  Filled 2022-08-23: qty 28

## 2022-08-23 MED ORDER — GUAIFENESIN-DM 100-10 MG/5ML PO SYRP
5.0000 mL | ORAL_SOLUTION | ORAL | Status: DC | PRN
  Administered 2022-08-23 – 2022-08-27 (×10): 5 mL via ORAL
  Filled 2022-08-23 (×11): qty 10

## 2022-08-23 MED ORDER — INSULIN ASPART 100 UNIT/ML IJ SOLN
0.0000 [IU] | Freq: Every day | INTRAMUSCULAR | Status: DC
  Administered 2022-08-23 – 2022-08-24 (×2): 3 [IU] via SUBCUTANEOUS
  Administered 2022-08-25 – 2022-08-26 (×2): 4 [IU] via SUBCUTANEOUS
  Filled 2022-08-23 (×4): qty 1

## 2022-08-23 MED ORDER — PREDNISONE 20 MG PO TABS
40.0000 mg | ORAL_TABLET | Freq: Every day | ORAL | Status: AC
  Administered 2022-08-24 – 2022-08-27 (×4): 40 mg via ORAL
  Filled 2022-08-23 (×4): qty 2

## 2022-08-23 MED ORDER — FUROSEMIDE 40 MG PO TABS
40.0000 mg | ORAL_TABLET | Freq: Every day | ORAL | Status: DC
  Administered 2022-08-23 – 2022-08-26 (×4): 40 mg via ORAL
  Filled 2022-08-23 (×4): qty 1

## 2022-08-23 NOTE — Inpatient Diabetes Management (Addendum)
Inpatient Diabetes Program Recommendations  AACE/ADA: New Consensus Statement on Inpatient Glycemic Control   Target Ranges:  Prepandial:   less than 140 mg/dL      Peak postprandial:   less than 180 mg/dL (1-2 hours)      Critically ill patients:  140 - 180 mg/dL    Latest Reference Range & Units 08/22/22 20:25 08/23/22 04:19  Glucose 70 - 99 mg/dL 282 (H) 345 (H)   Review of Glycemic Control  Diabetes history: DM2 Outpatient Diabetes medications: Glipizide XL 2.5 mg QAM Current orders for Inpatient glycemic control: none  Inpatient Diabetes Program Recommendations:    Insulin: Please consider ordering CBGs AC&HS with Novolog 0-15 units AC&HS.  NOTE: Per chart review, patient was given Solumedrol 125 mg via EMS prior to arrival at hospital. Lab glucose this morning 345 mg/dl.   Thanks, Barnie Alderman, RN, MSN, [place] Diabetes Coordinator Inpatient Diabetes Program [phone] (Team Pager from 8am to 5pm)'

## 2022-08-23 NOTE — ED Notes (Signed)
MD at bedside. 

## 2022-08-23 NOTE — Progress Notes (Addendum)
Progress Note    Leslie Parker  QPR:[id number] DOB: Jun 22, 1933  DOA: 08/22/2022 PCP: Gladstone Lighter, MD      Brief Narrative:    Medical records reviewed and are as summarized below:  Leslie Parker is a 89 y.o. female with medical history significant for stage 3 COPD, type II DM, hypertension, atrial fibrillation on Eliquis, chronic diastolic CHF, osteoarthritis, history of left hip hemiarthroplasty, diaphragmatic hernia.  She presented to the hospital because of 4 to 5-day history of cough, sore throat, myalgia, chills, generalized weakness and wheezing.      Assessment/Plan:   Principal Problem:   Respiratory distress Active Problems:   Stage 3b chronic kidney disease (HCC)   Shortness of breath   Hypertension   Diabetes mellitus without complication (HCC)   Chronic obstructive lung disease (HCC)   Lactic acidosis   Chronic a-fib (HCC)   COPD exacerbation (HCC)   Body mass index is 27.07 kg/m.   Acute COPD exacerbation: Start prednisone.  Continue bronchodilators and azithromycin.  CT chest did not show any evidence of pneumonia.  Ordered respiratory viral panel.  COVID, influenza and RSV test were negative.  Procalcitonin level is normal.   Probable acute exacerbation of chronic diastolic CHF, mild aortic stenosis: She was given IV Lasix on 08/22/2022.  Start oral Lasix.  BNP was 451.3.  2D echo in November 2023 showed EF estimated at 60 to 65%, mild LVH, indeterminate LV diastolic parameters, mild MR, mild to moderate TR, mild aortic stenosis   Lactic acidosis: No clear etiology of lactic acidosis at this time.  Repeat lactic acid level tomorrow.   Type II DM with hyperglycemia: Home glipizide is on hold.  Use NovoLog as needed for hyperglycemia.   Permanent atrial fibrillation: Continue Eliquis and Cardizem   Other comorbidities include osteoarthritis, hypertension, CKD stage IIIb     Diet Order             Diet heart healthy/carb modified  Room service appropriate? Yes; Fluid consistency: Thin  Diet effective now                            Consultants: None  Procedures: None    Medications:    apixaban  2.5 mg Oral BID   ascorbic acid  250 mg Oral Daily   colestipol  1 g Oral BID   cyanocobalamin  2,500 mcg Oral Daily   diltiazem  240 mg Oral Daily   docusate sodium  100 mg Oral BID   [START ON 08/24/2022] fluticasone furoate-vilanterol  1 puff Inhalation Daily   And   [START ON 08/24/2022] umeclidinium bromide  1 puff Inhalation Daily   insulin aspart  0-15 Units Subcutaneous TID WC   insulin aspart  0-5 Units Subcutaneous QHS   magnesium oxide  400 mg Oral Daily   multivitamin with minerals  1 tablet Oral Daily   [START ON 08/24/2022] predniSONE  40 mg Oral Q breakfast   Continuous Infusions:  azithromycin       Anti-infectives (From admission, onward)    Start     Dose/Rate Route Frequency Ordered Stop   08/23/22 2200  azithromycin (ZITHROMAX) 500 mg in sodium chloride 0.9 % 250 mL IVPB        500 mg 250 mL/hr over 60 Minutes Intravenous Every 24 hours 08/22/22 2153 08/27/22 2159   08/22/22 2130  ceFEPIme (MAXIPIME) 2 g in sodium chloride 0.9 % 100 mL  IVPB        2 g 200 mL/hr over 30 Minutes Intravenous  Once 08/22/22 2121 08/22/22 2209   08/22/22 2130  azithromycin (ZITHROMAX) 500 mg in sodium chloride 0.9 % 250 mL IVPB        500 mg 250 mL/hr over 60 Minutes Intravenous  Once 08/22/22 2121 08/22/22 2213              Family Communication/Anticipated D/C date and plan/Code Status   DVT prophylaxis: apixaban (ELIQUIS) tablet 2.5 mg Start: 08/22/22 2300 apixaban (ELIQUIS) tablet 2.5 mg     Code Status: Full Code  Family Communication: None Disposition Plan: Plan to discharge home in 1 to 2 days   Status is: Inpatient Remains inpatient appropriate because: COPD exacerbation         Subjective:   Interval events noted.  She complains of cough, general weakness.   Breathing is a little better.  Objective:    Vitals:   08/23/22 0438 08/23/22 0800 08/23/22 1003 08/23/22 1100  BP:  (!) 158/92 (!) 166/68   Pulse:  88 96   Resp:  13 19   Temp: 97.6 F (36.4 C) 97.8 F (36.6 C)  98 F (36.7 C)  TempSrc: Oral     SpO2:  99% 97%   Weight:      Height:       No data found.   Intake/Output Summary (Last 24 hours) at 08/23/2022 1219 Last data filed at 08/23/2022 7353 Gross per 24 hour  Intake --  Output 500 ml  Net -500 ml   Filed Weights   08/22/22 2014  Weight: 83.1 kg    Exam:   GEN: NAD SKIN: Warm and dry EYES: No pallor or icterus ENT: MMM CV: RRR PULM: B/l rhonchi and wheezing ABD: soft, ND, NT, +BS CNS: AAO x 3, non focal EXT: No edema or tenderness      Data Reviewed:   I have personally reviewed following labs and imaging studies:  Labs: Labs show the following:   Basic Metabolic Panel: Recent Labs  Lab 08/22/22 2025 08/23/22 0419  NA 137 137  K 3.6 3.6  CL 104 106  CO2 23 22  GLUCOSE 282* 345*  BUN 32* 32*  CREATININE 1.55* 1.51*  CALCIUM 8.4* 8.1*   GFR Estimated Creatinine Clearance: 30.2 mL/min (A) (by C-G formula based on SCr of 1.51 mg/dL (H)). Liver Function Tests: Recent Labs  Lab 08/23/22 0419  AST 26  ALT 23  ALKPHOS 56  BILITOT 0.6  PROT 6.2*  ALBUMIN 3.1*   No results for input(s): "LIPASE", "AMYLASE" in the last 168 hours. No results for input(s): "AMMONIA" in the last 168 hours. Coagulation profile Recent Labs  Lab 08/23/22 0419  INR 1.4*    CBC: Recent Labs  Lab 08/22/22 2025 08/23/22 0419  WBC 8.5 7.0  NEUTROABS 4.5  --   HGB 12.3 11.5*  HCT 36.7 33.9*  MCV 89.7 90.2  PLT 274 241   Cardiac Enzymes: No results for input(s): "CKTOTAL", "CKMB", "CKMBINDEX", "TROPONINI" in the last 168 hours. BNP (last 3 results) No results for input(s): "PROBNP" in the last 8760 hours. CBG: No results for input(s): "GLUCAP" in the last 168 hours. D-Dimer: No results for  input(s): "DDIMER" in the last 72 hours. Hgb A1c: No results for input(s): "HGBA1C" in the last 72 hours. Lipid Profile: No results for input(s): "CHOL", "HDL", "LDLCALC", "TRIG", "CHOLHDL", "LDLDIRECT" in the last 72 hours. Thyroid function studies: No results for  input(s): "TSH", "T4TOTAL", "T3FREE", "THYROIDAB" in the last 72 hours.  Invalid input(s): "FREET3" Anemia work up: No results for input(s): "VITAMINB12", "FOLATE", "FERRITIN", "TIBC", "IRON", "RETICCTPCT" in the last 72 hours. Sepsis Labs: Recent Labs  Lab 08/22/22 2025 08/22/22 2215 08/23/22 0419  PROCALCITON <0.10  --   --   WBC 8.5  --  7.0  LATICACIDVEN 2.5* 2.3*  --     Microbiology Recent Results (from the past 240 hour(s))  Resp panel by RT-PCR (RSV, Flu A&B, Covid) Anterior Nasal Swab     Status: None   Collection Time: 08/22/22  8:25 PM   Specimen: Anterior Nasal Swab  Result Value Ref Range Status   SARS Coronavirus 2 by RT PCR NEGATIVE NEGATIVE Final    Comment: (NOTE) SARS-CoV-2 target nucleic acids are NOT DETECTED.  The SARS-CoV-2 RNA is generally detectable in upper respiratory specimens during the acute phase of infection. The lowest concentration of SARS-CoV-2 viral copies this assay can detect is 138 copies/mL. A negative result does not preclude SARS-Cov-2 infection and should not be used as the sole basis for treatment or other patient management decisions. A negative result may occur with  improper specimen collection/handling, submission of specimen other than nasopharyngeal swab, presence of viral mutation(s) within the areas targeted by this assay, and inadequate number of viral copies(<138 copies/mL). A negative result must be combined with clinical observations, patient history, and epidemiological information. The expected result is Negative.  Fact Sheet for Patients:  EntrepreneurPulse.com.au  Fact Sheet for Healthcare Providers:   IncredibleEmployment.be  This test is no t yet approved or cleared by the [place] FDA and  has been authorized for detection and/or diagnosis of SARS-CoV-2 by FDA under an Emergency Use Authorization (EUA). This EUA will remain  in effect (meaning this test can be used) for the duration of the COVID-19 declaration under Section 564(b)(1) of the Act, 21 U.S.C.section 360bbb-3(b)(1), unless the authorization is terminated  or revoked sooner.       Influenza A by PCR NEGATIVE NEGATIVE Final   Influenza B by PCR NEGATIVE NEGATIVE Final    Comment: (NOTE) The Xpert Xpress SARS-CoV-2/FLU/RSV plus assay is intended as an aid in the diagnosis of influenza from Nasopharyngeal swab specimens and should not be used as a sole basis for treatment. Nasal washings and aspirates are unacceptable for Xpert Xpress SARS-CoV-2/FLU/RSV testing.  Fact Sheet for Patients: EntrepreneurPulse.com.au  Fact Sheet for Healthcare Providers: IncredibleEmployment.be  This test is not yet approved or cleared by the [place] FDA and has been authorized for detection and/or diagnosis of SARS-CoV-2 by FDA under an Emergency Use Authorization (EUA). This EUA will remain in effect (meaning this test can be used) for the duration of the COVID-19 declaration under Section 564(b)(1) of the Act, 21 U.S.C. section 360bbb-3(b)(1), unless the authorization is terminated or revoked.     Resp Syncytial Virus by PCR NEGATIVE NEGATIVE Final    Comment: (NOTE) Fact Sheet for Patients: EntrepreneurPulse.com.au  Fact Sheet for Healthcare Providers: IncredibleEmployment.be  This test is not yet approved or cleared by the [place] FDA and has been authorized for detection and/or diagnosis of SARS-CoV-2 by FDA under an Emergency Use Authorization (EUA). This EUA will remain in effect (meaning this test can be used) for  the duration of the COVID-19 declaration under Section 564(b)(1) of the Act, 21 U.S.C. section 360bbb-3(b)(1), unless the authorization is terminated or revoked.  Performed at [facility] [address], [place], [place] [address]  Procedures and diagnostic studies:  CT CHEST WO CONTRAST  Result Date: 08/23/2022 CLINICAL DATA:  Pneumonia complications suspected. Respiratory distress. Possible sepsis. Fever, cough and chills for 5 days. EXAM: CT CHEST WITHOUT CONTRAST TECHNIQUE: Multidetector CT imaging of the chest was performed following the standard protocol without IV contrast. RADIATION DOSE REDUCTION: This exam was performed according to the departmental dose-optimization program which includes automated exposure control, adjustment of the mA and/or kV according to patient size and/or use of iterative reconstruction technique. COMPARISON:  Chest x-ray 08/22/2022 and prior chest CT 06/06/2022 FINDINGS: Cardiovascular: The heart is normal in size. No pericardial effusion. Stable atherosclerotic calcifications involving thoracic aorta and branch vessels including three-vessel coronary artery calcifications. There are also extensive calcifications around the aortic valve. Stable pulmonary artery enlargement suggesting pulmonary hypertension. Mediastinum/Nodes: Stable scattered borderline mediastinal and hilar lymph nodes likely reactive. The esophagus is grossly normal. There is a stable moderate-sized hiatal hernia. Stable 2 cm right thyroid nodule. Recommend thyroid US (ref: J Am Coll Radiol. 2015 Feb;12(2): 143-50). Lungs/Pleura: Stable underlying emphysematous changes and areas of pulmonary scarring. No pulmonary infiltrates or pleural effusions. There is moderate lower lobe peribronchial thickening and streaky areas of atelectasis suggesting bronchitis. Upper Abdomen: No significant upper abdominal findings. Stable numerous renal cysts and renal calculi. No further imaging  evaluation follow-up is necessary. Musculoskeletal: No significant bony findings. Stable degenerative changes involving the spine. IMPRESSION: 1. Stable emphysematous changes and areas of pulmonary scarring. 2. Moderate lower lobe peribronchial thickening and streaky areas of atelectasis suggesting bronchitis. 3. Stable scattered borderline mediastinal and hilar lymph nodes, likely reactive. 4. Stable moderate-sized hiatal hernia. 5. Stable pulmonary artery enlargement suggesting pulmonary hypertension. 6. Stable 2 cm right thyroid nodule. Recommend thyroid US. 7. Stable numerous renal cysts and renal calculi. 8. Aortic atherosclerosis. Aortic Atherosclerosis (ICD10-I70.0) and Emphysema (ICD10-J43.9). Electronically Signed   By: Marijo Sanes M.D.   On: 08/23/2022 10:17   DG Chest Portable 1 View  Result Date: 08/22/2022 CLINICAL DATA:  Shortness of breath EXAM: PORTABLE CHEST 1 VIEW COMPARISON:  06/05/2022 FINDINGS: Cardiac shadow is stable. Aortic calcifications are seen. The lungs are well aerated bilaterally. Scarring is noted in the left base stable from the prior exam. Postsurgical changes in the right shoulder are noted. IMPRESSION: No active disease. Electronically Signed   By: Inez Catalina M.D.   On: 08/22/2022 21:01               LOS: 0 days   Kamarie Veno  [facility]   Pager on [email or web address]. If 7PM-7AM, please contact night-coverage at [email or web address]     08/23/2022, 12:19 PM

## 2022-08-23 NOTE — ED Notes (Signed)
RN trial patient on room air. Pt o2 saturation remaining 97-98%. Pt denies any SOB at rest.

## 2022-08-23 NOTE — ED Notes (Signed)
Assumed care from [place]. Pt resting comfortably in bed at this time. Pt denies any current needs or questions. Call light with in reach.

## 2022-08-23 NOTE — ED Notes (Signed)
RN at bedside, pt having coughing attack and emesis due to cough. Pt states she has been getting these often. RN administered PRN zofran and PRN duoneb treatment. Pt has audible wheezing with labored respirations. Pt still coughing. RN advised pt to take slow deep breaths. Pt o2 saturation maintaining 97% even during episode.

## 2022-08-23 NOTE — Progress Notes (Signed)
Dr Mal Misty made aware of tele monitoring reports 8 beat run of vtach, acknowledged, no new orders

## 2022-08-24 DIAGNOSIS — J441 Chronic obstructive pulmonary disease with (acute) exacerbation: Secondary | ICD-10-CM | POA: Diagnosis not present

## 2022-08-24 DIAGNOSIS — I482 Chronic atrial fibrillation, unspecified: Secondary | ICD-10-CM

## 2022-08-24 LAB — BASIC METABOLIC PANEL
Anion gap: 7 (ref 5–15)
BUN: 42 mg/dL — ABNORMAL HIGH (ref 8–23)
CO2: 24 mmol/L (ref 22–32)
Calcium: 8.4 mg/dL — ABNORMAL LOW (ref 8.9–10.3)
Chloride: 106 mmol/L (ref 98–111)
Creatinine, Ser: 1.46 mg/dL — ABNORMAL HIGH (ref 0.44–1.00)
GFR, Estimated: 35 mL/min — ABNORMAL LOW (ref >60)
Glucose, Bld: 208 mg/dL — ABNORMAL HIGH (ref 70–99)
Potassium: 4 mmol/L (ref 3.5–5.1)
Sodium: 137 mmol/L (ref 135–145)

## 2022-08-24 LAB — GLUCOSE, CAPILLARY
Glucose-Capillary: 203 mg/dL — ABNORMAL HIGH (ref 70–99)
Glucose-Capillary: 249 mg/dL — ABNORMAL HIGH (ref 70–99)
Glucose-Capillary: 325 mg/dL — ABNORMAL HIGH (ref 70–99)
Glucose-Capillary: 185 mg/dL — ABNORMAL HIGH (ref 70–99)

## 2022-08-24 LAB — CBC WITH DIFFERENTIAL/PLATELET
Abs Immature Granulocytes: 0.25 10*3/uL — ABNORMAL HIGH (ref 0.00–0.07)
Basophils Absolute: 0 10*3/uL (ref 0.0–0.1)
Basophils Relative: 0 %
Eosinophils Absolute: 0 10*3/uL (ref 0.0–0.5)
Eosinophils Relative: 0 %
HCT: 34.7 % — ABNORMAL LOW (ref 36.0–46.0)
Hemoglobin: 11.8 g/dL — ABNORMAL LOW (ref 12.0–15.0)
Immature Granulocytes: 1 %
Lymphocytes Relative: 6 %
Lymphs Abs: 1.1 10*3/uL (ref 0.7–4.0)
MCH: 30.1 pg (ref 26.0–34.0)
MCHC: 34 g/dL (ref 30.0–36.0)
MCV: 88.5 fL (ref 80.0–100.0)
Monocytes Absolute: 0.8 10*3/uL (ref 0.1–1.0)
Monocytes Relative: 4 %
Neutro Abs: 17 10*3/uL — ABNORMAL HIGH (ref 1.7–7.7)
Neutrophils Relative %: 89 %
Platelets: 283 10*3/uL (ref 150–400)
RBC: 3.92 MIL/uL (ref 3.87–5.11)
RDW: 12.9 % (ref 11.5–15.5)
WBC: 19.2 10*3/uL — ABNORMAL HIGH (ref 4.0–10.5)
nRBC: 0 % (ref 0.0–0.2)

## 2022-08-24 LAB — LACTIC ACID, PLASMA: Lactic Acid, Venous: 1.2 mmol/L (ref 0.5–1.9)

## 2022-08-24 MED ORDER — IPRATROPIUM-ALBUTEROL 0.5-2.5 (3) MG/3ML IN SOLN
3.0000 mL | Freq: Four times a day (QID) | RESPIRATORY_TRACT | Status: DC
  Administered 2022-08-24 – 2022-08-25 (×7): 3 mL via RESPIRATORY_TRACT
  Filled 2022-08-24 (×7): qty 3

## 2022-08-24 MED ORDER — ALBUTEROL SULFATE (2.5 MG/3ML) 0.083% IN NEBU
3.0000 mL | INHALATION_SOLUTION | RESPIRATORY_TRACT | Status: DC | PRN
  Filled 2022-08-24: qty 3

## 2022-08-24 NOTE — Inpatient Diabetes Management (Signed)
Inpatient Diabetes Program Recommendations  AACE/ADA: New Consensus Statement on Inpatient Glycemic Control   Target Ranges:  Prepandial:   less than 140 mg/dL      Peak postprandial:   less than 180 mg/dL (1-2 hours)      Critically ill patients:  140 - 180 mg/dL    Latest Reference Range & Units 08/23/22 12:21 08/23/22 15:55 08/23/22 21:27 08/24/22 08:00  Glucose-Capillary 70 - 99 mg/dL 323 (H) 208 (H) 253 (H) 203 (H)   Review of Glycemic Control  Diabetes history: DM2 Outpatient Diabetes medications: Glipizide XL 2.5 mg QAM Current orders for Inpatient glycemic control: Novolog 0-15 units TID with meals, Novolog 0-5 units QHS; Prednisone 40 mg QAM  Inpatient Diabetes Program Recommendations:    Insulin: If steroids are continued, please consider ordering  Semglee 5 units Q24H.  Thanks, Barnie Alderman, RN, MSN, [place] [facility] Coordinator Inpatient Diabetes Program [phone] (Team Pager from 8am to [place])

## 2022-08-24 NOTE — Progress Notes (Addendum)
Progress Note    Leslie Parker  SNK:[id number] DOB: 1933/05/25  DOA: 08/22/2022 PCP: Gladstone Lighter, MD      Brief Narrative:    Medical records reviewed and are as summarized below:  Leslie Parker is a 89 y.o. female with medical history significant for stage 3 COPD, type II DM, hypertension, atrial fibrillation on Eliquis, chronic diastolic CHF, osteoarthritis, history of left hip hemiarthroplasty, diaphragmatic hernia.  She presented to the hospital because of 4 to 5-day history of cough, sore throat, myalgia, chills, generalized weakness and wheezing.      Assessment/Plan:   Principal Problem:   Respiratory distress Active Problems:   Stage 3b chronic kidney disease (HCC)   Shortness of breath   Hypertension   Diabetes mellitus without complication (HCC)   Chronic obstructive lung disease (HCC)   Lactic acidosis   Chronic a-fib (HCC)   COPD exacerbation (HCC)   Body mass index is 27.07 kg/m.   Acute COPD exacerbation: Continue prednisone, azithromycin and bronchodilators.  No evidence of pneumonia on CT chest.  There is evidence of bronchitis and pulmonary hypertension on CT chest.  Respiratory viral panel was negative.   COVID, influenza and RSV test were negative.  Procalcitonin level is normal. She is on 2.5 L/min oxygen via [place].  Wean off oxygen as able.   Probable acute exacerbation of chronic diastolic CHF, mild aortic stenosis: She was given IV Lasix on 08/22/2022.  Continue oral Lasix.  BNP was 451.3.  2D echo in November 2023 showed EF estimated at 60 to 65%, mild LVH, indeterminate LV diastolic parameters, mild MR, mild to moderate TR, mild aortic stenosis   Lactic acidosis: No clear etiology of lactic acidosis at this time.  Repeat lactic acid level was normal.     Type II DM with hyperglycemia: Home glipizide is on hold.  Continue NovoLog as needed for hyperglycemia.   Permanent atrial fibrillation: Continue Eliquis and Cardizem   Stable 2  cm right thyroid nodule: Outpatient follow-up recommended.   Other comorbidities include osteoarthritis, hypertension, CKD stage IIIb     Diet Order             Diet heart healthy/carb modified Room service appropriate? Yes; Fluid consistency: Thin  Diet effective now                            Consultants: None  Procedures: None    Medications:    apixaban  2.5 mg Oral BID   ascorbic acid  250 mg Oral Daily   colestipol  1 g Oral BID   cyanocobalamin  2,500 mcg Oral Daily   diltiazem  240 mg Oral Daily   docusate sodium  100 mg Oral BID   fluticasone furoate-vilanterol  1 puff Inhalation Daily   And   umeclidinium bromide  1 puff Inhalation Daily   furosemide  40 mg Oral Daily   insulin aspart  0-15 Units Subcutaneous TID WC   insulin aspart  0-5 Units Subcutaneous QHS   ipratropium-albuterol  3 mL Nebulization Q6H   magnesium oxide  400 mg Oral Daily   multivitamin with minerals  1 tablet Oral Daily   predniSONE  40 mg Oral Q breakfast   Continuous Infusions:  azithromycin 500 mg (08/23/22 2313)     Anti-infectives (From admission, onward)    Start     Dose/Rate Route Frequency Ordered Stop   08/23/22 2200  azithromycin (ZITHROMAX) 500  mg in sodium chloride 0.9 % 250 mL IVPB        500 mg 250 mL/hr over 60 Minutes Intravenous Every 24 hours 08/22/22 2153 08/27/22 2159   08/22/22 2130  ceFEPIme (MAXIPIME) 2 g in sodium chloride 0.9 % 100 mL IVPB        2 g 200 mL/hr over 30 Minutes Intravenous  Once 08/22/22 2121 08/22/22 2209   08/22/22 2130  azithromycin (ZITHROMAX) 500 mg in sodium chloride 0.9 % 250 mL IVPB        500 mg 250 mL/hr over 60 Minutes Intravenous  Once 08/22/22 2121 08/22/22 2213              Family Communication/Anticipated D/C date and plan/Code Status   DVT prophylaxis: apixaban (ELIQUIS) tablet 2.5 mg Start: 08/22/22 2300 apixaban (ELIQUIS) tablet 2.5 mg     Code Status: Full Code  Family Communication:  None Disposition Plan: Plan to discharge home in 1 to 2 days   Status is: Inpatient Remains inpatient appropriate because: COPD exacerbation         Subjective:   She still has a cough and wheezing.  No chest pain.  Objective:    Vitals:   08/24/22 0449 08/24/22 0504 08/24/22 0740 08/24/22 0756  BP: (!) 133/99   (!) 151/90  Pulse: 100   (!) 107  Resp: 20   20  Temp: 98.4 F (36.9 C)   98.3 F (36.8 C)  TempSrc:      SpO2: 94% 97% 97% 98%  Weight:      Height:       No data found.   Intake/Output Summary (Last 24 hours) at 08/24/2022 1157 Last data filed at 08/24/2022 0755 Gross per 24 hour  Intake 623.96 ml  Output 1050 ml  Net -426.04 ml   Filed Weights   08/22/22 2014  Weight: 83.1 kg    Exam:   GEN: NAD SKIN: Warm and dry EYES: Anicteric ENT: MMM CV: RRR PULM: Diffuse bilateral wheezing, no rales ABD: soft, ND, NT, +BS CNS: AAO x 3, non focal EXT: No edema or tenderness   Data Reviewed:   I have personally reviewed following labs and imaging studies:  Labs: Labs show the following:   Basic Metabolic Panel: Recent Labs  Lab 08/22/22 2025 08/23/22 0419 08/24/22 0516  NA 137 137 137  K 3.6 3.6 4.0  CL 104 106 106  CO2 '23 22 24  '$ GLUCOSE 282* 345* 208*  BUN 32* 32* 42*  CREATININE 1.55* 1.51* 1.46*  CALCIUM 8.4* 8.1* 8.4*   GFR Estimated Creatinine Clearance: 31.3 mL/min (A) (by C-G formula based on SCr of 1.46 mg/dL (H)). Liver Function Tests: Recent Labs  Lab 08/23/22 0419  AST 26  ALT 23  ALKPHOS 56  BILITOT 0.6  PROT 6.2*  ALBUMIN 3.1*   No results for input(s): "LIPASE", "AMYLASE" in the last 168 hours. No results for input(s): "AMMONIA" in the last 168 hours. Coagulation profile Recent Labs  Lab 08/23/22 0419  INR 1.4*    CBC: Recent Labs  Lab 08/22/22 2025 08/23/22 0419 08/24/22 0516  WBC 8.5 7.0 19.2*  NEUTROABS 4.5  --  17.0*  HGB 12.3 11.5* 11.8*  HCT 36.7 33.9* 34.7*  MCV 89.7 90.2 88.5  PLT 274  241 283   Cardiac Enzymes: No results for input(s): "CKTOTAL", "CKMB", "CKMBINDEX", "TROPONINI" in the last 168 hours. BNP (last 3 results) No results for input(s): "PROBNP" in the last 8760 hours. CBG: Recent Labs  Lab 08/23/22 1221 08/23/22 1555 08/23/22 2127 08/24/22 0800 08/24/22 1146  GLUCAP 323* 208* 253* 203* 249*   D-Dimer: No results for input(s): "DDIMER" in the last 72 hours. Hgb A1c: No results for input(s): "HGBA1C" in the last 72 hours. Lipid Profile: No results for input(s): "CHOL", "HDL", "LDLCALC", "TRIG", "CHOLHDL", "LDLDIRECT" in the last 72 hours. Thyroid function studies: No results for input(s): "TSH", "T4TOTAL", "T3FREE", "THYROIDAB" in the last 72 hours.  Invalid input(s): "FREET3" Anemia work up: No results for input(s): "VITAMINB12", "FOLATE", "FERRITIN", "TIBC", "IRON", "RETICCTPCT" in the last 72 hours. Sepsis Labs: Recent Labs  Lab 08/22/22 2025 08/22/22 2215 08/23/22 0419 08/24/22 0516  PROCALCITON <0.10  --   --   --   WBC 8.5  --  7.0 19.2*  LATICACIDVEN 2.5* 2.3*  --  1.2    Microbiology Recent Results (from the past 240 hour(s))  Resp panel by RT-PCR (RSV, Flu A&B, Covid) Anterior Nasal Swab     Status: None   Collection Time: 08/22/22  8:25 PM   Specimen: Anterior Nasal Swab  Result Value Ref Range Status   SARS Coronavirus 2 by RT PCR NEGATIVE NEGATIVE Final    Comment: (NOTE) SARS-CoV-2 target nucleic acids are NOT DETECTED.  The SARS-CoV-2 RNA is generally detectable in upper respiratory specimens during the acute phase of infection. The lowest concentration of SARS-CoV-2 viral copies this assay can detect is 138 copies/mL. A negative result does not preclude SARS-Cov-2 infection and should not be used as the sole basis for treatment or other patient management decisions. A negative result may occur with  improper specimen collection/handling, submission of specimen other than nasopharyngeal swab, presence of viral  mutation(s) within the areas targeted by this assay, and inadequate number of viral copies(<138 copies/mL). A negative result must be combined with clinical observations, patient history, and epidemiological information. The expected result is Negative.  Fact Sheet for Patients:  EntrepreneurPulse.com.au  Fact Sheet for Healthcare Providers:  IncredibleEmployment.be  This test is no t yet approved or cleared by the [place] FDA and  has been authorized for detection and/or diagnosis of SARS-CoV-2 by FDA under an Emergency Use Authorization (EUA). This EUA will remain  in effect (meaning this test can be used) for the duration of the COVID-19 declaration under Section 564(b)(1) of the Act, 21 U.S.C.section 360bbb-3(b)(1), unless the authorization is terminated  or revoked sooner.       Influenza A by PCR NEGATIVE NEGATIVE Final   Influenza B by PCR NEGATIVE NEGATIVE Final    Comment: (NOTE) The Xpert Xpress SARS-CoV-2/FLU/RSV plus assay is intended as an aid in the diagnosis of influenza from Nasopharyngeal swab specimens and should not be used as a sole basis for treatment. Nasal washings and aspirates are unacceptable for Xpert Xpress SARS-CoV-2/FLU/RSV testing.  Fact Sheet for Patients: EntrepreneurPulse.com.au  Fact Sheet for Healthcare Providers: IncredibleEmployment.be  This test is not yet approved or cleared by the [place] FDA and has been authorized for detection and/or diagnosis of SARS-CoV-2 by FDA under an Emergency Use Authorization (EUA). This EUA will remain in effect (meaning this test can be used) for the duration of the COVID-19 declaration under Section 564(b)(1) of the Act, 21 U.S.C. section 360bbb-3(b)(1), unless the authorization is terminated or revoked.     Resp Syncytial Virus by PCR NEGATIVE NEGATIVE Final    Comment: (NOTE) Fact Sheet for  Patients: EntrepreneurPulse.com.au  Fact Sheet for Healthcare Providers: IncredibleEmployment.be  This test is not yet approved or cleared by the  [place] Architectural technologist and has been authorized for detection and/or diagnosis of SARS-CoV-2 by FDA under an Print production planner (EUA). This EUA will remain in effect (meaning this test can be used) for the duration of the COVID-19 declaration under Section 564(b)(1) of the Act, 21 U.S.C. section 360bbb-3(b)(1), unless the authorization is terminated or revoked.  Performed at [facility], [place], [place] [address]   Respiratory (~20 pathogens) panel by PCR     Status: None   Collection Time: 08/23/22 10:09 AM   Specimen: Nasopharyngeal Swab; Respiratory  Result Value Ref Range Status   Adenovirus NOT DETECTED NOT DETECTED Final   Coronavirus 229E NOT DETECTED NOT DETECTED Final    Comment: (NOTE) The Coronavirus on the Respiratory Panel, DOES NOT test for the novel  Coronavirus (2019 nCoV)    Coronavirus HKU1 NOT DETECTED NOT DETECTED Final   Coronavirus NL63 NOT DETECTED NOT DETECTED Final   Coronavirus OC43 NOT DETECTED NOT DETECTED Final   Metapneumovirus NOT DETECTED NOT DETECTED Final   Rhinovirus / Enterovirus NOT DETECTED NOT DETECTED Final   Influenza A NOT DETECTED NOT DETECTED Final   Influenza B NOT DETECTED NOT DETECTED Final   Parainfluenza Virus 1 NOT DETECTED NOT DETECTED Final   Parainfluenza Virus 2 NOT DETECTED NOT DETECTED Final   Parainfluenza Virus 3 NOT DETECTED NOT DETECTED Final   Parainfluenza Virus 4 NOT DETECTED NOT DETECTED Final   Respiratory Syncytial Virus NOT DETECTED NOT DETECTED Final   Bordetella pertussis NOT DETECTED NOT DETECTED Final   Bordetella Parapertussis NOT DETECTED NOT DETECTED Final   Chlamydophila pneumoniae NOT DETECTED NOT DETECTED Final   Mycoplasma pneumoniae NOT DETECTED NOT DETECTED Final    Comment: Performed at  [facility], [place]. [address] [place], [place] [address]    Procedures and diagnostic studies:  CT CHEST WO CONTRAST  Result Date: 08/23/2022 CLINICAL DATA:  Pneumonia complications suspected. Respiratory distress. Possible sepsis. Fever, cough and chills for 5 days. EXAM: CT CHEST WITHOUT CONTRAST TECHNIQUE: Multidetector CT imaging of the chest was performed following the standard protocol without IV contrast. RADIATION DOSE REDUCTION: This exam was performed according to the departmental dose-optimization program which includes automated exposure control, adjustment of the mA and/or kV according to patient size and/or use of iterative reconstruction technique. COMPARISON:  Chest x-ray 08/22/2022 and prior chest CT 06/06/2022 FINDINGS: Cardiovascular: The heart is normal in size. No pericardial effusion. Stable atherosclerotic calcifications involving thoracic aorta and branch vessels including three-vessel coronary artery calcifications. There are also extensive calcifications around the aortic valve. Stable pulmonary artery enlargement suggesting pulmonary hypertension. Mediastinum/Nodes: Stable scattered borderline mediastinal and hilar lymph nodes likely reactive. The esophagus is grossly normal. There is a stable moderate-sized hiatal hernia. Stable 2 cm right thyroid nodule. Recommend thyroid US (ref: J Am Coll Radiol. 2015 Feb;12(2): 143-50). Lungs/Pleura: Stable underlying emphysematous changes and areas of pulmonary scarring. No pulmonary infiltrates or pleural effusions. There is moderate lower lobe peribronchial thickening and streaky areas of atelectasis suggesting bronchitis. Upper Abdomen: No significant upper abdominal findings. Stable numerous renal cysts and renal calculi. No further imaging evaluation follow-up is necessary. Musculoskeletal: No significant bony findings. Stable degenerative changes involving the spine. IMPRESSION: 1. Stable emphysematous changes and areas of pulmonary  scarring. 2. Moderate lower lobe peribronchial thickening and streaky areas of atelectasis suggesting bronchitis. 3. Stable scattered borderline mediastinal and hilar lymph nodes, likely reactive. 4. Stable moderate-sized hiatal hernia. 5. Stable pulmonary artery enlargement suggesting pulmonary hypertension. 6. Stable  2 cm right thyroid nodule. Recommend thyroid US. 7. Stable numerous renal cysts and renal calculi. 8. Aortic atherosclerosis. Aortic Atherosclerosis (ICD10-I70.0) and Emphysema (ICD10-J43.9). Electronically Signed   By: Marijo Sanes M.D.   On: 08/23/2022 10:17   DG Chest Portable 1 View  Result Date: 08/22/2022 CLINICAL DATA:  Shortness of breath EXAM: PORTABLE CHEST 1 VIEW COMPARISON:  06/05/2022 FINDINGS: Cardiac shadow is stable. Aortic calcifications are seen. The lungs are well aerated bilaterally. Scarring is noted in the left base stable from the prior exam. Postsurgical changes in the right shoulder are noted. IMPRESSION: No active disease. Electronically Signed   By: Inez Catalina M.D.   On: 08/22/2022 21:01               LOS: 1 day   Jozalynn Noyce  [facility]   Pager on [email or web address]. If 7PM-7AM, please contact night-coverage at [email or web address]     08/24/2022, 11:57 AM

## 2022-08-25 ENCOUNTER — Encounter: Payer: Self-pay | Admitting: Internal Medicine

## 2022-08-25 DIAGNOSIS — J441 Chronic obstructive pulmonary disease with (acute) exacerbation: Secondary | ICD-10-CM | POA: Diagnosis not present

## 2022-08-25 DIAGNOSIS — R0603 Acute respiratory distress: Secondary | ICD-10-CM | POA: Diagnosis not present

## 2022-08-25 DIAGNOSIS — N1832 Chronic kidney disease, stage 3b: Secondary | ICD-10-CM | POA: Diagnosis not present

## 2022-08-25 LAB — GLUCOSE, CAPILLARY
Glucose-Capillary: 153 mg/dL — ABNORMAL HIGH (ref 70–99)
Glucose-Capillary: 221 mg/dL — ABNORMAL HIGH (ref 70–99)
Glucose-Capillary: 290 mg/dL — ABNORMAL HIGH (ref 70–99)
Glucose-Capillary: 321 mg/dL — ABNORMAL HIGH (ref 70–99)

## 2022-08-25 LAB — MAGNESIUM: Magnesium: 1.5 mg/dL — ABNORMAL LOW (ref 1.7–2.4)

## 2022-08-25 MED ORDER — IPRATROPIUM-ALBUTEROL 0.5-2.5 (3) MG/3ML IN SOLN
3.0000 mL | Freq: Three times a day (TID) | RESPIRATORY_TRACT | Status: DC
  Administered 2022-08-26 – 2022-08-27 (×5): 3 mL via RESPIRATORY_TRACT
  Filled 2022-08-25 (×5): qty 3

## 2022-08-25 MED ORDER — MAGNESIUM SULFATE 4 GM/100ML IV SOLN
4.0000 g | Freq: Once | INTRAVENOUS | Status: AC
  Administered 2022-08-26: 4 g via INTRAVENOUS
  Filled 2022-08-25: qty 100

## 2022-08-25 MED ORDER — FUROSEMIDE 40 MG PO TABS
40.0000 mg | ORAL_TABLET | Freq: Once | ORAL | Status: AC
  Administered 2022-08-25: 40 mg via ORAL
  Filled 2022-08-25: qty 1

## 2022-08-25 NOTE — Progress Notes (Signed)
Progress Note    Leslie Parker  VWU:[id number] DOB: 10-06-1932  DOA: 08/22/2022 PCP: Gladstone Lighter, MD      Brief Narrative:    Medical records reviewed and are as summarized below:  Leslie Parker is a 89 y.o. female with medical history significant for stage 3 COPD, type II DM, hypertension, atrial fibrillation on Eliquis, chronic diastolic CHF, osteoarthritis, history of left hip hemiarthroplasty, diaphragmatic hernia.  She presented to the hospital because of 4 to 5-day history of cough, sore throat, myalgia, chills, generalized weakness and wheezing.      Assessment/Plan:   Principal Problem:   Respiratory distress Active Problems:   Stage 3b chronic kidney disease (HCC)   Shortness of breath   Hypertension   Diabetes mellitus without complication (HCC)   Chronic obstructive lung disease (HCC)   Lactic acidosis   Chronic a-fib (HCC)   COPD exacerbation (HCC)   Body mass index is 27.07 kg/m.   Acute COPD exacerbation: Continue azithromycin, bronchodilators and prednisone.  No evidence of pneumonia on CT chest.  There is evidence of bronchitis and pulmonary hypertension on CT chest.  Respiratory viral panel was negative.   COVID, influenza and RSV test were negative.  Procalcitonin level is normal. Continue 2 L/min oxygen via [place].  Wean off oxygen as able.   Probable acute exacerbation of chronic diastolic CHF, mild aortic stenosis: She was given IV Lasix on 08/22/2022.  Continue oral Lasix.  BNP was 451.3.  2D echo in November 2023 showed EF estimated at 60 to 65%, mild LVH, indeterminate LV diastolic parameters, mild MR, mild to moderate TR, mild aortic stenosis   Lactic acidosis: No clear etiology of lactic acidosis at this time.  Repeat lactic acid level was normal.     Type II DM with hyperglycemia: Home glipizide is on hold.  Continue NovoLog as needed for hyperglycemia.   Permanent atrial fibrillation: Fluctuating heart rate with rate in the 120s.   Continue Cardizem and Eliquis   Stable 2 cm right thyroid nodule: Outpatient follow-up recommended.   General weakness: PT evaluation   Other comorbidities include osteoarthritis, hypertension, CKD stage IIIb     Diet Order             Diet heart healthy/carb modified Room service appropriate? Yes; Fluid consistency: Thin  Diet effective now                            Consultants: None  Procedures: None    Medications:    apixaban  2.5 mg Oral BID   ascorbic acid  250 mg Oral Daily   colestipol  1 g Oral BID   cyanocobalamin  2,500 mcg Oral Daily   diltiazem  240 mg Oral Daily   docusate sodium  100 mg Oral BID   fluticasone furoate-vilanterol  1 puff Inhalation Daily   And   umeclidinium bromide  1 puff Inhalation Daily   furosemide  40 mg Oral Daily   insulin aspart  0-15 Units Subcutaneous TID WC   insulin aspart  0-5 Units Subcutaneous QHS   ipratropium-albuterol  3 mL Nebulization Q6H   magnesium oxide  400 mg Oral Daily   multivitamin with minerals  1 tablet Oral Daily   predniSONE  40 mg Oral Q breakfast   Continuous Infusions:  azithromycin Stopped (08/24/22 2357)     Anti-infectives (From admission, onward)    Start     Dose/Rate  Route Frequency Ordered Stop   08/23/22 2200  azithromycin (ZITHROMAX) 500 mg in sodium chloride 0.9 % 250 mL IVPB        500 mg 250 mL/hr over 60 Minutes Intravenous Every 24 hours 08/22/22 2153 08/27/22 2159   08/22/22 2130  ceFEPIme (MAXIPIME) 2 g in sodium chloride 0.9 % 100 mL IVPB        2 g 200 mL/hr over 30 Minutes Intravenous  Once 08/22/22 2121 08/22/22 2209   08/22/22 2130  azithromycin (ZITHROMAX) 500 mg in sodium chloride 0.9 % 250 mL IVPB        500 mg 250 mL/hr over 60 Minutes Intravenous  Once 08/22/22 2121 08/22/22 2213              Family Communication/Anticipated D/C date and plan/Code Status   DVT prophylaxis: apixaban (ELIQUIS) tablet 2.5 mg Start: 08/22/22  2300 apixaban (ELIQUIS) tablet 2.5 mg     Code Status: Full Code  Family Communication: None Disposition Plan: Plan to discharge home in 1 to 2 days   Status is: Inpatient Remains inpatient appropriate because: COPD exacerbation         Subjective:   She complains of cough, wheezing and shortness of breath.  She also reports shaking of her body.  She feels weak.  Objective:    Vitals:   08/25/22 0025 08/25/22 0238 08/25/22 0456 08/25/22 0810  BP: (!) 153/93  (!) 155/79 (!) 169/98  Pulse: 93  (!) 102 (!) 109  Resp: '20  18 20  '$ Temp: 97.9 F (36.6 C)  98.1 F (36.7 C) 97.9 F (36.6 C)  TempSrc:      SpO2: 98% 98% 97% 97%  Weight:      Height:       No data found.   Intake/Output Summary (Last 24 hours) at 08/25/2022 1054 Last data filed at 08/25/2022 0700 Gross per 24 hour  Intake 610 ml  Output 600 ml  Net 10 ml   Filed Weights   08/22/22 2014  Weight: 83.1 kg    Exam:   GEN: NAD SKIN: Warm and dry EYES: No pallor or icterus ENT: MMM CV: Irregular rate and rhythm,  tachycardia PULM: Diffuse bilateral wheezing, no rales ABD: soft, ND, NT, +BS CNS: AAO x 3, non focal EXT: No edema or tenderness     Data Reviewed:   I have personally reviewed following labs and imaging studies:  Labs: Labs show the following:   Basic Metabolic Panel: Recent Labs  Lab 08/22/22 2025 08/23/22 0419 08/24/22 0516  NA 137 137 137  K 3.6 3.6 4.0  CL 104 106 106  CO2 '23 22 24  '$ GLUCOSE 282* 345* 208*  BUN 32* 32* 42*  CREATININE 1.55* 1.51* 1.46*  CALCIUM 8.4* 8.1* 8.4*   GFR Estimated Creatinine Clearance: 31.3 mL/min (A) (by C-G formula based on SCr of 1.46 mg/dL (H)). Liver Function Tests: Recent Labs  Lab 08/23/22 0419  AST 26  ALT 23  ALKPHOS 56  BILITOT 0.6  PROT 6.2*  ALBUMIN 3.1*   No results for input(s): "LIPASE", "AMYLASE" in the last 168 hours. No results for input(s): "AMMONIA" in the last 168 hours. Coagulation profile Recent Labs   Lab 08/23/22 0419  INR 1.4*    CBC: Recent Labs  Lab 08/22/22 2025 08/23/22 0419 08/24/22 0516  WBC 8.5 7.0 19.2*  NEUTROABS 4.5  --  17.0*  HGB 12.3 11.5* 11.8*  HCT 36.7 33.9* 34.7*  MCV 89.7 90.2 88.5  PLT  274 241 283   Cardiac Enzymes: No results for input(s): "CKTOTAL", "CKMB", "CKMBINDEX", "TROPONINI" in the last 168 hours. BNP (last 3 results) No results for input(s): "PROBNP" in the last 8760 hours. CBG: Recent Labs  Lab 08/24/22 0800 08/24/22 1146 08/24/22 1617 08/24/22 2106 08/25/22 0808  GLUCAP 203* 249* 325* 185* 153*   D-Dimer: No results for input(s): "DDIMER" in the last 72 hours. Hgb A1c: No results for input(s): "HGBA1C" in the last 72 hours. Lipid Profile: No results for input(s): "CHOL", "HDL", "LDLCALC", "TRIG", "CHOLHDL", "LDLDIRECT" in the last 72 hours. Thyroid function studies: No results for input(s): "TSH", "T4TOTAL", "T3FREE", "THYROIDAB" in the last 72 hours.  Invalid input(s): "FREET3" Anemia work up: No results for input(s): "VITAMINB12", "FOLATE", "FERRITIN", "TIBC", "IRON", "RETICCTPCT" in the last 72 hours. Sepsis Labs: Recent Labs  Lab 08/22/22 2025 08/22/22 2215 08/23/22 0419 08/24/22 0516  PROCALCITON <0.10  --   --   --   WBC 8.5  --  7.0 19.2*  LATICACIDVEN 2.5* 2.3*  --  1.2    Microbiology Recent Results (from the past 240 hour(s))  Resp panel by RT-PCR (RSV, Flu A&B, Covid) Anterior Nasal Swab     Status: None   Collection Time: 08/22/22  8:25 PM   Specimen: Anterior Nasal Swab  Result Value Ref Range Status   SARS Coronavirus 2 by RT PCR NEGATIVE NEGATIVE Final    Comment: (NOTE) SARS-CoV-2 target nucleic acids are NOT DETECTED.  The SARS-CoV-2 RNA is generally detectable in upper respiratory specimens during the acute phase of infection. The lowest concentration of SARS-CoV-2 viral copies this assay can detect is 138 copies/mL. A negative result does not preclude SARS-Cov-2 infection and should not be  used as the sole basis for treatment or other patient management decisions. A negative result may occur with  improper specimen collection/handling, submission of specimen other than nasopharyngeal swab, presence of viral mutation(s) within the areas targeted by this assay, and inadequate number of viral copies(<138 copies/mL). A negative result must be combined with clinical observations, patient history, and epidemiological information. The expected result is Negative.  Fact Sheet for Patients:  EntrepreneurPulse.com.au  Fact Sheet for Healthcare Providers:  IncredibleEmployment.be  This test is no t yet approved or cleared by the [place] FDA and  has been authorized for detection and/or diagnosis of SARS-CoV-2 by FDA under an Emergency Use Authorization (EUA). This EUA will remain  in effect (meaning this test can be used) for the duration of the COVID-19 declaration under Section 564(b)(1) of the Act, 21 U.S.C.section 360bbb-3(b)(1), unless the authorization is terminated  or revoked sooner.       Influenza A by PCR NEGATIVE NEGATIVE Final   Influenza B by PCR NEGATIVE NEGATIVE Final    Comment: (NOTE) The Xpert Xpress SARS-CoV-2/FLU/RSV plus assay is intended as an aid in the diagnosis of influenza from Nasopharyngeal swab specimens and should not be used as a sole basis for treatment. Nasal washings and aspirates are unacceptable for Xpert Xpress SARS-CoV-2/FLU/RSV testing.  Fact Sheet for Patients: EntrepreneurPulse.com.au  Fact Sheet for Healthcare Providers: IncredibleEmployment.be  This test is not yet approved or cleared by the [place] FDA and has been authorized for detection and/or diagnosis of SARS-CoV-2 by FDA under an Emergency Use Authorization (EUA). This EUA will remain in effect (meaning this test can be used) for the duration of the COVID-19 declaration under Section  564(b)(1) of the Act, 21 U.S.C. section 360bbb-3(b)(1), unless the authorization is terminated or revoked.  Resp Syncytial Virus by PCR NEGATIVE NEGATIVE Final    Comment: (NOTE) Fact Sheet for Patients: EntrepreneurPulse.com.au  Fact Sheet for Healthcare Providers: IncredibleEmployment.be  This test is not yet approved or cleared by the [place] FDA and has been authorized for detection and/or diagnosis of SARS-CoV-2 by FDA under an Emergency Use Authorization (EUA). This EUA will remain in effect (meaning this test can be used) for the duration of the COVID-19 declaration under Section 564(b)(1) of the Act, 21 U.S.C. section 360bbb-3(b)(1), unless the authorization is terminated or revoked.  Performed at [facility], [place], [place] [address]   Respiratory (~20 pathogens) panel by PCR     Status: None   Collection Time: 08/23/22 10:09 AM   Specimen: Nasopharyngeal Swab; Respiratory  Result Value Ref Range Status   Adenovirus NOT DETECTED NOT DETECTED Final   Coronavirus 229E NOT DETECTED NOT DETECTED Final    Comment: (NOTE) The Coronavirus on the Respiratory Panel, DOES NOT test for the novel  Coronavirus (2019 nCoV)    Coronavirus HKU1 NOT DETECTED NOT DETECTED Final   Coronavirus NL63 NOT DETECTED NOT DETECTED Final   Coronavirus OC43 NOT DETECTED NOT DETECTED Final   Metapneumovirus NOT DETECTED NOT DETECTED Final   Rhinovirus / Enterovirus NOT DETECTED NOT DETECTED Final   Influenza A NOT DETECTED NOT DETECTED Final   Influenza B NOT DETECTED NOT DETECTED Final   Parainfluenza Virus 1 NOT DETECTED NOT DETECTED Final   Parainfluenza Virus 2 NOT DETECTED NOT DETECTED Final   Parainfluenza Virus 3 NOT DETECTED NOT DETECTED Final   Parainfluenza Virus 4 NOT DETECTED NOT DETECTED Final   Respiratory Syncytial Virus NOT DETECTED NOT DETECTED Final   Bordetella pertussis NOT DETECTED NOT DETECTED  Final   Bordetella Parapertussis NOT DETECTED NOT DETECTED Final   Chlamydophila pneumoniae NOT DETECTED NOT DETECTED Final   Mycoplasma pneumoniae NOT DETECTED NOT DETECTED Final    Comment: Performed at [facility], [place]. [address] [place], [place] [address]    Procedures and diagnostic studies:  No results found.             LOS: 2 days   Jozey Janco  [facility]   Pager on [email or web address]. If 7PM-7AM, please contact night-coverage at [email or web address]     08/25/2022, 10:54 AM

## 2022-08-25 NOTE — Evaluation (Signed)
Physical Therapy Evaluation Patient Details Name: Leslie Parker MRN: [id number] DOB: [birth year] Today's Date: 08/25/2022  History of Present Illness  Leslie Parker is a 89 y.o. female with medical history significant for stage 3 COPD, type II DM, hypertension, atrial fibrillation on Eliquis, chronic diastolic CHF, osteoarthritis, history of left hip hemiarthroplasty, diaphragmatic hernia.  She presented to the hospital because of 4 to 5-day history of cough, sore throat, myalgia, chills, generalized weakness and wheezing.  Clinical Impression  The pt presents this session in good spirits. She is on RA with oxygen saturation at 92% prior to ambulation. With ambulation her oxygen saturation improves to 94-96% on RA. The pt is able to ambulate 200' with RW with supervision. She reports her baseline being use of a cane for community distances, but is agreeable to use her cane for household distances and rollator for community distances at discharge. It is expected that as her pulmonary status continues to improve that she will be able to return to her baseline assistive devices. PT will continue to follow.      Recommendations for follow up therapy are one component of a multi-disciplinary discharge planning process, led by the attending physician.  Recommendations may be updated based on patient status, additional functional criteria and insurance authorization.  Follow Up Recommendations No PT follow up      Assistance Recommended at Discharge Set up Supervision/Assistance  Patient can return home with the following  A little help with walking and/or transfers;A little help with bathing/dressing/bathroom;Assistance with cooking/housework    Equipment Recommendations None recommended by PT  Recommendations for Other Services       Functional Status Assessment Patient has had a recent decline in their functional status and demonstrates the ability to make significant improvements in function in a  reasonable and predictable amount of time.     Precautions / Restrictions Precautions Precautions: Fall Restrictions Weight Bearing Restrictions: No      Mobility  Bed Mobility Overal bed mobility: Needs Assistance Bed Mobility: Supine to Sit     Supine to sit: Supervision          Transfers Overall transfer level: Needs assistance Equipment used: Rolling walker (2 wheels) Transfers: Sit to/from Stand Sit to Stand: Supervision                Ambulation/Gait Ambulation/Gait assistance: Supervision Gait Distance (Feet): 200 Feet Assistive device: Rolling walker (2 wheels)            Stairs            Wheelchair Mobility    Modified Rankin (Stroke Patients Only)       Balance Overall balance assessment: Needs assistance   Sitting balance-[name]: Good       Standing balance-[name]: Good Standing balance comment: Pt requires RW for stability with gait; pt educated to use [facility] upon d/c for safety.                             Pertinent Vitals/Pain Pain Assessment Pain Assessment: No/denies pain    Home Living Family/patient expects to be discharged to:: Private residence Living Arrangements: Alone Available Help at Discharge: Available PRN/intermittently;Friend(s);Neighbor;Family Type of Home: Apartment Home Access: Level entry       Home Layout: One level Home Equipment: Rollator (4 wheels);Cane - single point;Grab bars - tub/shower;Grab bars - toilet Additional Comments: Senior living apartment    Prior Function Prior Level of Function :  Independent/Modified Independent;Driving                     Hand Dominance   Dominant Hand: Right    Extremity/Trunk Assessment   Upper Extremity Assessment Upper Extremity Assessment: Overall WFL for tasks assessed    Lower Extremity Assessment Lower Extremity Assessment: Overall WFL for tasks assessed    Cervical / Trunk Assessment Cervical / Trunk  Assessment: Normal  Communication   Communication: No difficulties  Cognition Arousal/Alertness: Awake/alert Behavior During Therapy: WFL for tasks assessed/performed Overall Cognitive Status: Within Functional Limits for tasks assessed                                          General Comments      Exercises     Assessment/Plan    PT Assessment Patient needs continued PT services  PT Problem List Cardiopulmonary status limiting activity       PT Treatment Interventions Therapeutic exercise;Balance training;Therapeutic activities;Patient/family education;Functional mobility training    PT Goals (Current goals can be found in the Care Plan section)  Acute Rehab PT Goals Patient Stated Goal: Return home. PT Goal Formulation: With patient Time For Goal Achievement: 09/01/22 Potential to Achieve Goals: Good    Frequency Min 2X/week     Co-evaluation               AM-PAC PT "6 Clicks" Mobility  Outcome Measure Help needed turning from your back to your side while in a flat bed without using bedrails?: None Help needed moving from lying on your back to sitting on the side of a flat bed without using bedrails?: A Little Help needed moving to and from a bed to a chair (including a wheelchair)?: A Little Help needed standing up from a chair using your arms (e.g., wheelchair or bedside chair)?: A Little Help needed to walk in hospital room?: A Little Help needed climbing 3-5 steps with a railing? : A Little 6 Click Score: 19    End of Session   Activity Tolerance: Patient tolerated treatment well Patient left: in chair (RT in room upon exit) Nurse Communication: Mobility status PT Visit Diagnosis: Unsteadiness on feet (R26.81)    Time: 1443-1540 PT Time Calculation (min) (ACUTE ONLY): 22 min   Charges:   PT Evaluation $PT Eval Low Complexity: 1 Low PT Treatments $Gait Training: 8-22 mins        3:13 PM, 08/25/22 Kaimana Neuzil A. Saverio Danker PT,  DPT Physical Therapist - [facility]   Dnya Hickle A Lunna Vogelgesang 08/25/2022, 3:09 PM

## 2022-08-26 DIAGNOSIS — R0603 Acute respiratory distress: Secondary | ICD-10-CM | POA: Diagnosis not present

## 2022-08-26 DIAGNOSIS — J441 Chronic obstructive pulmonary disease with (acute) exacerbation: Secondary | ICD-10-CM | POA: Diagnosis not present

## 2022-08-26 LAB — CBC WITH DIFFERENTIAL/PLATELET
Abs Immature Granulocytes: 0.44 10*3/uL — ABNORMAL HIGH (ref 0.00–0.07)
Basophils Absolute: 0.1 10*3/uL (ref 0.0–0.1)
Basophils Relative: 0 %
Eosinophils Absolute: 0 10*3/uL (ref 0.0–0.5)
Eosinophils Relative: 0 %
HCT: 35.8 % — ABNORMAL LOW (ref 36.0–46.0)
Hemoglobin: 12.3 g/dL (ref 12.0–15.0)
Immature Granulocytes: 3 %
Lymphocytes Relative: 11 %
Lymphs Abs: 1.7 10*3/uL (ref 0.7–4.0)
MCH: 30.6 pg (ref 26.0–34.0)
MCHC: 34.4 g/dL (ref 30.0–36.0)
MCV: 89.1 fL (ref 80.0–100.0)
Monocytes Absolute: 1 10*3/uL (ref 0.1–1.0)
Monocytes Relative: 6 %
Neutro Abs: 12.1 10*3/uL — ABNORMAL HIGH (ref 1.7–7.7)
Neutrophils Relative %: 80 %
Platelets: 295 10*3/uL (ref 150–400)
RBC: 4.02 MIL/uL (ref 3.87–5.11)
RDW: 12.7 % (ref 11.5–15.5)
WBC: 15.3 10*3/uL — ABNORMAL HIGH (ref 4.0–10.5)
nRBC: 0 % (ref 0.0–0.2)

## 2022-08-26 LAB — BASIC METABOLIC PANEL
Anion gap: 9 (ref 5–15)
BUN: 49 mg/dL — ABNORMAL HIGH (ref 8–23)
CO2: 25 mmol/L (ref 22–32)
Calcium: 8.4 mg/dL — ABNORMAL LOW (ref 8.9–10.3)
Chloride: 102 mmol/L (ref 98–111)
Creatinine, Ser: 1.46 mg/dL — ABNORMAL HIGH (ref 0.44–1.00)
GFR, Estimated: 35 mL/min — ABNORMAL LOW (ref >60)
Glucose, Bld: 194 mg/dL — ABNORMAL HIGH (ref 70–99)
Potassium: 3.5 mmol/L (ref 3.5–5.1)
Sodium: 136 mmol/L (ref 135–145)

## 2022-08-26 LAB — BRAIN NATRIURETIC PEPTIDE: B Natriuretic Peptide: 643.4 pg/mL — ABNORMAL HIGH (ref 0.0–100.0)

## 2022-08-26 LAB — GLUCOSE, CAPILLARY
Glucose-Capillary: 175 mg/dL — ABNORMAL HIGH (ref 70–99)
Glucose-Capillary: 205 mg/dL — ABNORMAL HIGH (ref 70–99)
Glucose-Capillary: 234 mg/dL — ABNORMAL HIGH (ref 70–99)
Glucose-Capillary: 346 mg/dL — ABNORMAL HIGH (ref 70–99)

## 2022-08-26 LAB — MAGNESIUM: Magnesium: 3 mg/dL — ABNORMAL HIGH (ref 1.7–2.4)

## 2022-08-26 MED ORDER — FUROSEMIDE 40 MG PO TABS
40.0000 mg | ORAL_TABLET | Freq: Two times a day (BID) | ORAL | Status: DC
  Administered 2022-08-27: 40 mg via ORAL
  Filled 2022-08-26: qty 1

## 2022-08-26 MED ORDER — ACETYLCYSTEINE 20 % IN SOLN
3.0000 mL | Freq: Three times a day (TID) | RESPIRATORY_TRACT | Status: DC
  Administered 2022-08-26 – 2022-08-27 (×3): 3 mL via RESPIRATORY_TRACT
  Filled 2022-08-26 (×3): qty 4

## 2022-08-26 MED ORDER — HYDRALAZINE HCL 20 MG/ML IJ SOLN: 10.0000 mg | Freq: Four times a day (QID) | INTRAMUSCULAR | Status: DC | PRN

## 2022-08-26 MED ORDER — FUROSEMIDE 10 MG/ML IJ SOLN
40.0000 mg | Freq: Two times a day (BID) | INTRAMUSCULAR | Status: AC
  Administered 2022-08-26 (×2): 40 mg via INTRAVENOUS
  Filled 2022-08-26 (×2): qty 4

## 2022-08-26 MED ORDER — INSULIN GLARGINE-YFGN 100 UNIT/ML ~~LOC~~ SOLN
8.0000 [IU] | Freq: Every day | SUBCUTANEOUS | Status: AC
  Administered 2022-08-26 – 2022-08-27 (×2): 8 [IU] via SUBCUTANEOUS
  Filled 2022-08-26 (×2): qty 0.08

## 2022-08-26 MED ORDER — POTASSIUM CHLORIDE CRYS ER 20 MEQ PO TBCR
40.0000 meq | EXTENDED_RELEASE_TABLET | Freq: Once | ORAL | Status: AC
  Administered 2022-08-26: 40 meq via ORAL
  Filled 2022-08-26: qty 2

## 2022-08-26 NOTE — Progress Notes (Signed)
       CROSS COVER NOTE  NAME: Leslie Parker MRN: [id number] DOB : Oct 29, 1932    HPI/Events of Note   Patitnadmitted witj COPD and CHF exacerbation. Experienced rhythm change afib tto flutter back to fib. Rate 100-120.   Assessment and  Interventions   Assessment:  Plan: Mag lever 1.5 - replaced 4 gms BNP increased this am - oral lasix already given early Consider additional IV lasix dose later today     Kathlene Cote NP [facility]

## 2022-08-26 NOTE — Progress Notes (Signed)
Patient converted to a flutter  during @ 2049. Made hospitalist aware. New orders given. Patient returned to afib with rvr for EKG. At 0500, patient with b/p 183/94, hr 79. Updated hospitalist. Gave Lasix early as directed. Ample urine output. Made hospitalist aware.

## 2022-08-26 NOTE — Progress Notes (Signed)
Progress Note    Leslie Parker  BOF:[id number] DOB: Jun 12, 1933  DOA: 08/22/2022 PCP: Gladstone Lighter, MD      Brief Narrative:    Medical records reviewed and are as summarized below:  Leslie Parker is a 89 y.o. female with medical history significant for stage 3 COPD, type II DM, hypertension, atrial fibrillation on Eliquis, chronic diastolic CHF, osteoarthritis, history of left hip hemiarthroplasty, diaphragmatic hernia.  She presented to the hospital because of 4 to 5-day history of cough, sore throat, myalgia, chills, generalized weakness and wheezing.      Assessment/Plan:   Principal Problem:   Respiratory distress Active Problems:   Stage 3b chronic kidney disease (HCC)   Shortness of breath   Hypertension   Diabetes mellitus without complication (HCC)   Chronic obstructive lung disease (HCC)   Lactic acidosis   Chronic a-fib (HCC)   COPD exacerbation (HCC)   Body mass index is 27.07 kg/m.   Acute COPD exacerbation: Completed azithromycin.  Continue prednisone and bronchodilators.  Add Mucomyst. No evidence of pneumonia on CT chest.  There is evidence of bronchitis and pulmonary hypertension on CT chest.  Respiratory viral panel was negative.   COVID, influenza and RSV test were negative.  Procalcitonin level is normal. He has been weaned off of oxygen and she is tolerating room air.   Probable acute exacerbation of chronic diastolic CHF, mild aortic stenosis: She was given IV Lasix on 08/22/2022.  Treat with IV Lasix.  BNP was 451.3.  2D echo in November 2023 showed EF estimated at 60 to 65%, mild LVH, indeterminate LV diastolic parameters, mild MR, mild to moderate TR, mild aortic stenosis   Hypomagnesemia: Repleted.  Repeat magnesium and replete as needed potassium level is low normal.  Replete with oral potassium chloride.   Lactic acidosis: No clear etiology of lactic acidosis at this time.  Repeat lactic acid level was normal.     Type II DM with  hyperglycemia: Home glipizide is on hold.  Start insulin glargine at 8 units daily.  Continue NovoLog as needed for hyperglycemia.   Permanent atrial fibrillation: Fluctuating heart rate with rate in the 120s.  Continue Cardizem and Eliquis   Stable 2 cm right thyroid nodule: Patient understands that she has to follow-up as an outpatient for further evaluation.     General weakness: She did well with PT and no skilled PT follow-up was recommended   Other comorbidities include osteoarthritis, hypertension, CKD stage IIIb     Diet Order             Diet heart healthy/carb modified Room service appropriate? Yes; Fluid consistency: Thin  Diet effective now                            Consultants: None  Procedures: None    Medications:    acetylcysteine  3 mL Nebulization TID   apixaban  2.5 mg Oral BID   ascorbic acid  250 mg Oral Daily   colestipol  1 g Oral BID   cyanocobalamin  2,500 mcg Oral Daily   diltiazem  240 mg Oral Daily   docusate sodium  100 mg Oral BID   fluticasone furoate-vilanterol  1 puff Inhalation Daily   And   umeclidinium bromide  1 puff Inhalation Daily   furosemide  40 mg Intravenous BID   [START ON 08/27/2022] furosemide  40 mg Oral BID   insulin aspart  0-15 Units Subcutaneous TID WC   insulin aspart  0-5 Units Subcutaneous QHS   insulin glargine-yfgn  8 Units Subcutaneous Daily   ipratropium-albuterol  3 mL Nebulization TID   magnesium oxide  400 mg Oral Daily   multivitamin with minerals  1 tablet Oral Daily   predniSONE  40 mg Oral Q breakfast   Continuous Infusions:  azithromycin 500 mg (08/25/22 2231)     Anti-infectives (From admission, onward)    Start     Dose/Rate Route Frequency Ordered Stop   08/23/22 2200  azithromycin (ZITHROMAX) 500 mg in sodium chloride 0.9 % 250 mL IVPB        500 mg 250 mL/hr over 60 Minutes Intravenous Every 24 hours 08/22/22 2153 08/27/22 2159   08/22/22 2130  ceFEPIme (MAXIPIME) 2 g  in sodium chloride 0.9 % 100 mL IVPB        2 g 200 mL/hr over 30 Minutes Intravenous  Once 08/22/22 2121 08/22/22 2209   08/22/22 2130  azithromycin (ZITHROMAX) 500 mg in sodium chloride 0.9 % 250 mL IVPB        500 mg 250 mL/hr over 60 Minutes Intravenous  Once 08/22/22 2121 08/22/22 2213              Family Communication/Anticipated D/C date and plan/Code Status   DVT prophylaxis: apixaban (ELIQUIS) tablet 2.5 mg Start: 08/22/22 2300 apixaban (ELIQUIS) tablet 2.5 mg     Code Status: Full Code  Family Communication: None Disposition Plan: Plan to discharge home tomorrow   Status is: Inpatient Remains inpatient appropriate because: COPD exacerbation         Subjective:   She complains of cough and wheezing.  She has difficulty getting phlegm out.  No chest pain.  Breathing is a little better today. Milta Deiters, RN, was at the bedside  Objective:    Vitals:   08/25/22 2359 08/26/22 0505 08/26/22 0818 08/26/22 1241  BP: 133/83 (!) 185/94 (!) 194/92 (!) 174/93  Pulse: 90 79 92 82  Resp: '18 20 18 19  '$ Temp: 97.9 F (36.6 C) 97.8 F (36.6 C) 97.6 F (36.4 C) 98.5 F (36.9 C)  TempSrc:      SpO2: 97% 96% 96% 95%  Weight:      Height:       No data found.   Intake/Output Summary (Last 24 hours) at 08/26/2022 1419 Last data filed at 08/26/2022 1610 Gross per 24 hour  Intake 1170 ml  Output 3800 ml  Net -2630 ml   Filed Weights   08/22/22 2014  Weight: 83.1 kg    Exam:    GEN: NAD SKIN: Warm and dry EYES: No pallor or icterus ENT: MMM CV: RRR PULM: Diffuse bilateral wheezing, no rales ABD: soft, ND, NT, +BS CNS: AAO x 3, non focal EXT: No edema or tenderness    Data Reviewed:   I have personally reviewed following labs and imaging studies:  Labs: Labs show the following:   Basic Metabolic Panel: Recent Labs  Lab 08/22/22 2025 08/23/22 0419 08/24/22 0516 08/25/22 2139 08/26/22 0352  NA 137 137 137  --  136  K 3.6 3.6 4.0  --  3.5   CL 104 106 106  --  102  CO2 '23 22 24  '$ --  25  GLUCOSE 282* 345* 208*  --  194*  BUN 32* 32* 42*  --  49*  CREATININE 1.55* 1.51* 1.46*  --  1.46*  CALCIUM 8.4* 8.1* 8.4*  --  8.4*  MG  --   --   --  1.5*  --    GFR Estimated Creatinine Clearance: 31.3 mL/min (A) (by C-G formula based on SCr of 1.46 mg/dL (H)). Liver Function Tests: Recent Labs  Lab 08/23/22 0419  AST 26  ALT 23  ALKPHOS 56  BILITOT 0.6  PROT 6.2*  ALBUMIN 3.1*   No results for input(s): "LIPASE", "AMYLASE" in the last 168 hours. No results for input(s): "AMMONIA" in the last 168 hours. Coagulation profile Recent Labs  Lab 08/23/22 0419  INR 1.4*    CBC: Recent Labs  Lab 08/22/22 2025 08/23/22 0419 08/24/22 0516 08/26/22 0352  WBC 8.5 7.0 19.2* 15.3*  NEUTROABS 4.5  --  17.0* 12.1*  HGB 12.3 11.5* 11.8* 12.3  HCT 36.7 33.9* 34.7* 35.8*  MCV 89.7 90.2 88.5 89.1  PLT 274 241 283 295   Cardiac Enzymes: No results for input(s): "CKTOTAL", "CKMB", "CKMBINDEX", "TROPONINI" in the last 168 hours. BNP (last 3 results) No results for input(s): "PROBNP" in the last 8760 hours. CBG: Recent Labs  Lab 08/25/22 1201 08/25/22 1707 08/25/22 2104 08/26/22 0811 08/26/22 1246  GLUCAP 221* 290* 321* 175* 205*   D-Dimer: No results for input(s): "DDIMER" in the last 72 hours. Hgb A1c: No results for input(s): "HGBA1C" in the last 72 hours. Lipid Profile: No results for input(s): "CHOL", "HDL", "LDLCALC", "TRIG", "CHOLHDL", "LDLDIRECT" in the last 72 hours. Thyroid function studies: No results for input(s): "TSH", "T4TOTAL", "T3FREE", "THYROIDAB" in the last 72 hours.  Invalid input(s): "FREET3" Anemia work up: No results for input(s): "VITAMINB12", "FOLATE", "FERRITIN", "TIBC", "IRON", "RETICCTPCT" in the last 72 hours. Sepsis Labs: Recent Labs  Lab 08/22/22 2025 08/22/22 2215 08/23/22 0419 08/24/22 0516 08/26/22 0352  PROCALCITON <0.10  --   --   --   --   WBC 8.5  --  7.0 19.2* 15.3*   LATICACIDVEN 2.5* 2.3*  --  1.2  --     Microbiology Recent Results (from the past 240 hour(s))  Resp panel by RT-PCR (RSV, Flu A&B, Covid) Anterior Nasal Swab     Status: None   Collection Time: 08/22/22  8:25 PM   Specimen: Anterior Nasal Swab  Result Value Ref Range Status   SARS Coronavirus 2 by RT PCR NEGATIVE NEGATIVE Final    Comment: (NOTE) SARS-CoV-2 target nucleic acids are NOT DETECTED.  The SARS-CoV-2 RNA is generally detectable in upper respiratory specimens during the acute phase of infection. The lowest concentration of SARS-CoV-2 viral copies this assay can detect is 138 copies/mL. A negative result does not preclude SARS-Cov-2 infection and should not be used as the sole basis for treatment or other patient management decisions. A negative result may occur with  improper specimen collection/handling, submission of specimen other than nasopharyngeal swab, presence of viral mutation(s) within the areas targeted by this assay, and inadequate number of viral copies(<138 copies/mL). A negative result must be combined with clinical observations, patient history, and epidemiological information. The expected result is Negative.  Fact Sheet for Patients:  EntrepreneurPulse.com.au  Fact Sheet for Healthcare Providers:  IncredibleEmployment.be  This test is no t yet approved or cleared by the [place] FDA and  has been authorized for detection and/or diagnosis of SARS-CoV-2 by FDA under an Emergency Use Authorization (EUA). This EUA will remain  in effect (meaning this test can be used) for the duration of the COVID-19 declaration under Section 564(b)(1) of the Act, 21 U.S.C.section 360bbb-3(b)(1), unless the authorization is terminated  or revoked  sooner.       Influenza A by PCR NEGATIVE NEGATIVE Final   Influenza B by PCR NEGATIVE NEGATIVE Final    Comment: (NOTE) The Xpert Xpress SARS-CoV-2/FLU/RSV plus assay is  intended as an aid in the diagnosis of influenza from Nasopharyngeal swab specimens and should not be used as a sole basis for treatment. Nasal washings and aspirates are unacceptable for Xpert Xpress SARS-CoV-2/FLU/RSV testing.  Fact Sheet for Patients: EntrepreneurPulse.com.au  Fact Sheet for Healthcare Providers: IncredibleEmployment.be  This test is not yet approved or cleared by the [place] FDA and has been authorized for detection and/or diagnosis of SARS-CoV-2 by FDA under an Emergency Use Authorization (EUA). This EUA will remain in effect (meaning this test can be used) for the duration of the COVID-19 declaration under Section 564(b)(1) of the Act, 21 U.S.C. section 360bbb-3(b)(1), unless the authorization is terminated or revoked.     Resp Syncytial Virus by PCR NEGATIVE NEGATIVE Final    Comment: (NOTE) Fact Sheet for Patients: EntrepreneurPulse.com.au  Fact Sheet for Healthcare Providers: IncredibleEmployment.be  This test is not yet approved or cleared by the [place] FDA and has been authorized for detection and/or diagnosis of SARS-CoV-2 by FDA under an Emergency Use Authorization (EUA). This EUA will remain in effect (meaning this test can be used) for the duration of the COVID-19 declaration under Section 564(b)(1) of the Act, 21 U.S.C. section 360bbb-3(b)(1), unless the authorization is terminated or revoked.  Performed at [facility], [place], [place], [place] [address]   Respiratory (~20 pathogens) panel by PCR     Status: None   Collection Time: 08/23/22 10:09 AM   Specimen: Nasopharyngeal Swab; Respiratory  Result Value Ref Range Status   Adenovirus NOT DETECTED NOT DETECTED Final   Coronavirus 229E NOT DETECTED NOT DETECTED Final    Comment: (NOTE) The Coronavirus on the Respiratory Panel, DOES NOT test for the novel  Coronavirus (2019 nCoV)     Coronavirus HKU1 NOT DETECTED NOT DETECTED Final   Coronavirus NL63 NOT DETECTED NOT DETECTED Final   Coronavirus OC43 NOT DETECTED NOT DETECTED Final   Metapneumovirus NOT DETECTED NOT DETECTED Final   Rhinovirus / Enterovirus NOT DETECTED NOT DETECTED Final   Influenza A NOT DETECTED NOT DETECTED Final   Influenza B NOT DETECTED NOT DETECTED Final   Parainfluenza Virus 1 NOT DETECTED NOT DETECTED Final   Parainfluenza Virus 2 NOT DETECTED NOT DETECTED Final   Parainfluenza Virus 3 NOT DETECTED NOT DETECTED Final   Parainfluenza Virus 4 NOT DETECTED NOT DETECTED Final   Respiratory Syncytial Virus NOT DETECTED NOT DETECTED Final   Bordetella pertussis NOT DETECTED NOT DETECTED Final   Bordetella Parapertussis NOT DETECTED NOT DETECTED Final   Chlamydophila pneumoniae NOT DETECTED NOT DETECTED Final   Mycoplasma pneumoniae NOT DETECTED NOT DETECTED Final    Comment: Performed at [facility], [place]. [address] [place], [place] [address]    Procedures and diagnostic studies:  No results found.             LOS: 3 days   Kimba Lottes  [facility]   Pager on [email or web address]. If 7PM-7AM, please contact night-coverage at [email or web address]     08/26/2022, 2:19 PM

## 2022-08-27 DIAGNOSIS — N1832 Chronic kidney disease, stage 3b: Secondary | ICD-10-CM | POA: Diagnosis not present

## 2022-08-27 DIAGNOSIS — I5033 Acute on chronic diastolic (congestive) heart failure: Secondary | ICD-10-CM

## 2022-08-27 DIAGNOSIS — J441 Chronic obstructive pulmonary disease with (acute) exacerbation: Secondary | ICD-10-CM | POA: Diagnosis not present

## 2022-08-27 LAB — BASIC METABOLIC PANEL
Anion gap: 10 (ref 5–15)
BUN: 61 mg/dL — ABNORMAL HIGH (ref 8–23)
CO2: 26 mmol/L (ref 22–32)
Calcium: 8.5 mg/dL — ABNORMAL LOW (ref 8.9–10.3)
Chloride: 100 mmol/L (ref 98–111)
Creatinine, Ser: 1.6 mg/dL — ABNORMAL HIGH (ref 0.44–1.00)
GFR, Estimated: 31 mL/min — ABNORMAL LOW (ref >60)
Glucose, Bld: 215 mg/dL — ABNORMAL HIGH (ref 70–99)
Potassium: 3.9 mmol/L (ref 3.5–5.1)
Sodium: 136 mmol/L (ref 135–145)

## 2022-08-27 LAB — GLUCOSE, CAPILLARY
Glucose-Capillary: 195 mg/dL — ABNORMAL HIGH (ref 70–99)
Glucose-Capillary: 237 mg/dL — ABNORMAL HIGH (ref 70–99)

## 2022-08-27 LAB — BRAIN NATRIURETIC PEPTIDE: B Natriuretic Peptide: 293.9 pg/mL — ABNORMAL HIGH (ref 0.0–100.0)

## 2022-08-27 LAB — MAGNESIUM: Magnesium: 2.2 mg/dL (ref 1.7–2.4)

## 2022-08-27 MED ORDER — PANTOPRAZOLE SODIUM 40 MG PO TBEC
40.0000 mg | DELAYED_RELEASE_TABLET | Freq: Every day | ORAL | Status: DC
  Administered 2022-08-27: 40 mg via ORAL
  Filled 2022-08-27: qty 1

## 2022-08-27 NOTE — Care Management Important Message (Signed)
Important Message  Patient Details  Name: SADEY YANDELL MRN: [id number] Date of Birth: September 22, 1932   Medicare Important Message Given:  Yes     Dannette Barbara 08/27/2022, 1:24 PM

## 2022-08-27 NOTE — Discharge Summary (Signed)
Physician Discharge Summary   Patient: Leslie Parker MRN: [id number] DOB: 12-May-1933  Admit date:     08/22/2022  Discharge date: 08/27/22  Discharge Physician: Jennye Boroughs   PCP: Gladstone Lighter, MD   Recommendations at discharge:   Follow-up with PCP in 1 week Follow-up with PCP for evaluation of right thyroid nodule  Discharge Diagnoses: Principal Problem:   COPD exacerbation ([place]) Active Problems:   Stage 3b chronic kidney disease ([place])   Respiratory distress   Shortness of breath   Acute on chronic diastolic CHF (congestive heart failure) (HCC)   Hypertension   Diabetes mellitus without complication (HCC)   Lactic acidosis   Chronic a-fib (HCC)   Type II diabetes mellitus ([place])  Resolved Problems:   * No resolved hospital problems. *  Hospital Course:   Leslie Parker is a 89 y.o. female with medical history significant for stage 3 COPD, asthma, type II DM, hypertension, atrial fibrillation on Eliquis, chronic diastolic CHF, osteoarthritis, history of left hip hemiarthroplasty, diaphragmatic hernia.  She presented to the hospital because of 4 to 5-day history of cough, sore throat, myalgia, chills, generalized weakness and wheezing.     Assessment and Plan:   Acute COPD vs asthma exacerbation: She has history of COPD and asthma.  She completed a course of prednisone and azithromycin.  Continue bronchodilators at discharge. No evidence of pneumonia on CT chest.  There is evidence of bronchitis and pulmonary hypertension on CT chest.  Respiratory viral panel was negative.   COVID, influenza and RSV test were negative.  Procalcitonin level is normal. He has been weaned off of oxygen and she is tolerating room air.     Probable acute exacerbation of chronic diastolic CHF, mild aortic stenosis: She was treated with IV Lasix.  BNP improved from 643-293.  Continue Lasix at discharge. 2D echo in November 2023 showed EF estimated at 60 to 65%, mild LVH, indeterminate LV  diastolic parameters, mild MR, mild to moderate TR, mild aortic stenosis     Hypomagnesemia: Improved     Lactic acidosis: No clear etiology of lactic acidosis at this time.  Repeat lactic acid level was normal.       Type II DM with hyperglycemia: Resume glipizide at discharge     Permanent atrial fibrillation: Heart rate has improved.  Continue Cardizem and Eliquis.    Stable 2 cm right thyroid nodule: Patient understands that she has to follow-up as an outpatient for further evaluation.       General weakness: She did well with PT and no skilled PT follow-up was recommended     Other comorbidities include osteoarthritis, hypertension, CKD stage IIIb       Her condition has improved significantly and she is deemed stable for discharge to home today.             Consultants: None Procedures performed: None  Disposition: Home Diet recommendation:  Discharge Diet Orders (From admission, onward)     Start     Ordered   08/27/22 0000  Diet - low sodium heart healthy        08/27/22 1138   08/27/22 0000  Diet Carb Modified        08/27/22 1138           Cardiac and Carb modified diet DISCHARGE MEDICATION: Allergies as of 08/27/2022       Reactions   Baclofen Other (See Comments)   Elevated Cr        Medication List  STOP taking these medications    cholecalciferol 25 MCG (1000 UNIT) tablet Commonly known as: VITAMIN D3   fluticasone-salmeterol 250-50 MCG/ACT Aepb Commonly known as: ADVAIR   predniSONE 20 MG tablet Commonly known as: DELTASONE   tiZANidine 4 MG tablet Commonly known as: ZANAFLEX       TAKE these medications    albuterol 108 (90 Base) MCG/ACT inhaler Commonly known as: VENTOLIN HFA Inhale 2 puffs into the lungs every 6 (six) hours as needed.   apixaban 2.5 MG Tabs tablet Commonly known as: ELIQUIS Take 1 tablet (2.5 mg total) by mouth 2 (two) times daily.   ascorbic acid 250 MG Chew Commonly known as: VITAMIN  C Chew 250 mg by mouth daily.   B-12 2500 MCG Tabs Take 2,500 mcg by mouth daily.   colestipol 1 g tablet Commonly known as: COLESTID Take 1 tablet by mouth 2 (two) times daily.   diltiazem 240 MG 24 hr capsule Commonly known as: CARDIZEM CD Take 1 capsule (240 mg total) by mouth daily.   furosemide 20 MG tablet Commonly known as: LASIX Take 20 mg by mouth daily.   glipiZIDE 2.5 MG 24 hr tablet Commonly known as: GLUCOTROL XL Take 2.5 mg by mouth daily with breakfast.   ipratropium-albuterol 0.5-2.5 (3) MG/3ML Soln Commonly known as: DUONEB Inhale 3 mLs into the lungs every 6 (six) hours as needed.   Magnesium Oxide -Mg Supplement 500 MG Caps Take 500 mg by mouth daily.   multivitamin with minerals Tabs tablet Take 1 tablet by mouth daily. One-A-Day Women's Vitamin   omeprazole 20 MG capsule Commonly known as: PRILOSEC Take 20 mg by mouth daily.   traMADol 50 MG tablet Commonly known as: ULTRAM Take 50 mg by mouth every 6 (six) hours as needed.   traZODone 100 MG tablet Commonly known as: DESYREL Take 100 mg by mouth at bedtime.   Trelegy Ellipta 100-62.5-25 MCG/ACT Aepb Generic drug: Fluticasone-Umeclidin-Vilant Inhale 1 puff into the lungs daily.        Discharge Exam: Filed Weights   08/22/22 2014  Weight: 83.1 kg   GEN: NAD SKIN: Warm and dry EYES: No pallor or icterus ENT: MMM CV: RRR PULM: No wheezing or rales heard ABD: soft, ND, NT, +BS CNS: AAO x 3, non focal EXT: No edema or tenderness   Condition at discharge: good  The results of significant diagnostics from this hospitalization (including imaging, microbiology, ancillary and laboratory) are listed below for reference.   Imaging Studies: CT CHEST WO CONTRAST  Result Date: 08/23/2022 CLINICAL DATA:  Pneumonia complications suspected. Respiratory distress. Possible sepsis. Fever, cough and chills for 5 days. EXAM: CT CHEST WITHOUT CONTRAST TECHNIQUE: Multidetector CT imaging of the  chest was performed following the standard protocol without IV contrast. RADIATION DOSE REDUCTION: This exam was performed according to the departmental dose-optimization program which includes automated exposure control, adjustment of the mA and/or kV according to patient size and/or use of iterative reconstruction technique. COMPARISON:  Chest x-ray 08/22/2022 and prior chest CT 06/06/2022 FINDINGS: Cardiovascular: The heart is normal in size. No pericardial effusion. Stable atherosclerotic calcifications involving thoracic aorta and branch vessels including three-vessel coronary artery calcifications. There are also extensive calcifications around the aortic valve. Stable pulmonary artery enlargement suggesting pulmonary hypertension. Mediastinum/Nodes: Stable scattered borderline mediastinal and hilar lymph nodes likely reactive. The esophagus is grossly normal. There is a stable moderate-sized hiatal hernia. Stable 2 cm right thyroid nodule. Recommend thyroid US (ref: J Am Coll Radiol. 2015 Feb;12(2): 143-50).  Lungs/Pleura: Stable underlying emphysematous changes and areas of pulmonary scarring. No pulmonary infiltrates or pleural effusions. There is moderate lower lobe peribronchial thickening and streaky areas of atelectasis suggesting bronchitis. Upper Abdomen: No significant upper abdominal findings. Stable numerous renal cysts and renal calculi. No further imaging evaluation follow-up is necessary. Musculoskeletal: No significant bony findings. Stable degenerative changes involving the spine. IMPRESSION: 1. Stable emphysematous changes and areas of pulmonary scarring. 2. Moderate lower lobe peribronchial thickening and streaky areas of atelectasis suggesting bronchitis. 3. Stable scattered borderline mediastinal and hilar lymph nodes, likely reactive. 4. Stable moderate-sized hiatal hernia. 5. Stable pulmonary artery enlargement suggesting pulmonary hypertension. 6. Stable 2 cm right thyroid nodule.  Recommend thyroid US. 7. Stable numerous renal cysts and renal calculi. 8. Aortic atherosclerosis. Aortic Atherosclerosis (ICD10-I70.0) and Emphysema (ICD10-J43.9). Electronically Signed   By: Marijo Sanes M.D.   On: 08/23/2022 10:17   DG Chest Portable 1 View  Result Date: 08/22/2022 CLINICAL DATA:  Shortness of breath EXAM: PORTABLE CHEST 1 VIEW COMPARISON:  06/05/2022 FINDINGS: Cardiac shadow is stable. Aortic calcifications are seen. The lungs are well aerated bilaterally. Scarring is noted in the left base stable from the prior exam. Postsurgical changes in the right shoulder are noted. IMPRESSION: No active disease. Electronically Signed   By: Inez Catalina M.D.   On: 08/22/2022 21:01    Microbiology: Results for orders placed or performed during the hospital encounter of 08/22/22  Resp panel by RT-PCR (RSV, Flu A&B, Covid) Anterior Nasal Swab     Status: None   Collection Time: 08/22/22  8:25 PM   Specimen: Anterior Nasal Swab  Result Value Ref Range Status   SARS Coronavirus 2 by RT PCR NEGATIVE NEGATIVE Final    Comment: (NOTE) SARS-CoV-2 target nucleic acids are NOT DETECTED.  The SARS-CoV-2 RNA is generally detectable in upper respiratory specimens during the acute phase of infection. The lowest concentration of SARS-CoV-2 viral copies this assay can detect is 138 copies/mL. A negative result does not preclude SARS-Cov-2 infection and should not be used as the sole basis for treatment or other patient management decisions. A negative result may occur with  improper specimen collection/handling, submission of specimen other than nasopharyngeal swab, presence of viral mutation(s) within the areas targeted by this assay, and inadequate number of viral copies(<138 copies/mL). A negative result must be combined with clinical observations, patient history, and epidemiological information. The expected result is Negative.  Fact Sheet for Patients:   EntrepreneurPulse.com.au  Fact Sheet for Healthcare Providers:  IncredibleEmployment.be  This test is no t yet approved or cleared by the [place] FDA and  has been authorized for detection and/or diagnosis of SARS-CoV-2 by FDA under an Emergency Use Authorization (EUA). This EUA will remain  in effect (meaning this test can be used) for the duration of the COVID-19 declaration under Section 564(b)(1) of the Act, 21 U.S.C.section 360bbb-3(b)(1), unless the authorization is terminated  or revoked sooner.       Influenza A by PCR NEGATIVE NEGATIVE Final   Influenza B by PCR NEGATIVE NEGATIVE Final    Comment: (NOTE) The Xpert Xpress SARS-CoV-2/FLU/RSV plus assay is intended as an aid in the diagnosis of influenza from Nasopharyngeal swab specimens and should not be used as a sole basis for treatment. Nasal washings and aspirates are unacceptable for Xpert Xpress SARS-CoV-2/FLU/RSV testing.  Fact Sheet for Patients: EntrepreneurPulse.com.au  Fact Sheet for Healthcare Providers: IncredibleEmployment.be  This test is not yet approved or cleared by the [place] and  has been authorized for detection and/or diagnosis of SARS-CoV-2 by FDA under an Emergency Use Authorization (EUA). This EUA will remain in effect (meaning this test can be used) for the duration of the COVID-19 declaration under Section 564(b)(1) of the Act, 21 U.S.C. section 360bbb-3(b)(1), unless the authorization is terminated or revoked.     Resp Syncytial Virus by PCR NEGATIVE NEGATIVE Final    Comment: (NOTE) Fact Sheet for Patients: EntrepreneurPulse.com.au  Fact Sheet for Healthcare Providers: IncredibleEmployment.be  This test is not yet approved or cleared by the [place] FDA and has been authorized for detection and/or diagnosis of SARS-CoV-2 by FDA under an Emergency Use  Authorization (EUA). This EUA will remain in effect (meaning this test can be used) for the duration of the COVID-19 declaration under Section 564(b)(1) of the Act, 21 U.S.C. section 360bbb-3(b)(1), unless the authorization is terminated or revoked.  Performed at [facility], [place], [place] [address]   Respiratory (~20 pathogens) panel by PCR     Status: None   Collection Time: 08/23/22 10:09 AM   Specimen: Nasopharyngeal Swab; Respiratory  Result Value Ref Range Status   Adenovirus NOT DETECTED NOT DETECTED Final   Coronavirus 229E NOT DETECTED NOT DETECTED Final    Comment: (NOTE) The Coronavirus on the Respiratory Panel, DOES NOT test for the novel  Coronavirus (2019 nCoV)    Coronavirus HKU1 NOT DETECTED NOT DETECTED Final   Coronavirus NL63 NOT DETECTED NOT DETECTED Final   Coronavirus OC43 NOT DETECTED NOT DETECTED Final   Metapneumovirus NOT DETECTED NOT DETECTED Final   Rhinovirus / Enterovirus NOT DETECTED NOT DETECTED Final   Influenza A NOT DETECTED NOT DETECTED Final   Influenza B NOT DETECTED NOT DETECTED Final   Parainfluenza Virus 1 NOT DETECTED NOT DETECTED Final   Parainfluenza Virus 2 NOT DETECTED NOT DETECTED Final   Parainfluenza Virus 3 NOT DETECTED NOT DETECTED Final   Parainfluenza Virus 4 NOT DETECTED NOT DETECTED Final   Respiratory Syncytial Virus NOT DETECTED NOT DETECTED Final   Bordetella pertussis NOT DETECTED NOT DETECTED Final   Bordetella Parapertussis NOT DETECTED NOT DETECTED Final   Chlamydophila pneumoniae NOT DETECTED NOT DETECTED Final   Mycoplasma pneumoniae NOT DETECTED NOT DETECTED Final    Comment: Performed at [facility], [place]. [address] [place], [place] [address]    Labs: CBC: Recent Labs  Lab 08/22/22 2025 08/23/22 0419 08/24/22 0516 08/26/22 0352  WBC 8.5 7.0 19.2* 15.3*  NEUTROABS 4.5  --  17.0* 12.1*  HGB 12.3 11.5* 11.8* 12.3  HCT 36.7 33.9* 34.7* 35.8*  MCV 89.7 90.2 88.5 89.1   PLT 274 241 283 818   Basic Metabolic Panel: Recent Labs  Lab 08/22/22 2025 08/23/22 0419 08/24/22 0516 08/25/22 2139 08/26/22 0346 08/26/22 0352 08/27/22 0809  NA 137 137 137  --   --  136 136  K 3.6 3.6 4.0  --   --  3.5 3.9  CL 104 106 106  --   --  102 100  CO2 '23 22 24  '$ --   --  25 26  GLUCOSE 282* 345* 208*  --   --  194* 215*  BUN 32* 32* 42*  --   --  49* 61*  CREATININE 1.55* 1.51* 1.46*  --   --  1.46* 1.60*  CALCIUM 8.4* 8.1* 8.4*  --   --  8.4* 8.5*  MG  --   --   --  1.5* 3.0*  --  2.2   Liver Function Tests: Recent Labs  Lab 08/23/22 0419  AST 26  ALT 23  ALKPHOS 56  BILITOT 0.6  PROT 6.2*  ALBUMIN 3.1*   CBG: Recent Labs  Lab 08/26/22 0811 08/26/22 1246 08/26/22 1607 08/26/22 2053 08/27/22 0816  GLUCAP 175* 205* 234* 346* 195*    Discharge time spent: greater than 30 minutes.  Signed: Jennye Boroughs, MD [facility] 08/27/2022

## 2022-08-27 NOTE — TOC Initial Note (Signed)
Transition of Care [facility]) - Initial/Assessment Note    Patient Details  Name: Leslie Parker MRN: [id number] Date of Birth: 05/06/1933  Transition of Care [facility]) CM/SW Contact:    Gerilyn Pilgrim, LCSW Phone Number: 08/27/2022, 12:06 PM  Clinical Narrative:   Pt admitted for COPD exacerbation PCP is Dr. Tressia Miners. Pt not interested in [facility] services. Pt uses a rollator occasionally. Pt lives alone bu daughter going to stay with her for the next week. [facility] [place]. Pt actively being discharged .CSW signing off.         Expected Discharge Plan: Home/Self Care Barriers to Discharge: Continued Medical Work up   Patient Goals and CMS Choice            Expected Discharge Plan and Services         Expected Discharge Date: 08/27/22                                    Prior Living Arrangements/Services     Patient language and need for interpreter reviewed:: Yes        Need for Family Participation in Patient Care: No (Comment) Care giver support system in place?: Yes (comment)      Activities of Daily Living Home Assistive Devices/Equipment: Walker (specify type), Cane (specify quad or straight), Dentures (specify type), Nebulizer ADL Screening (condition at time of admission) Patient's cognitive ability adequate to safely complete daily activities?: Yes Is the patient deaf or have difficulty hearing?: No Does the patient have difficulty seeing, even when wearing glasses/contacts?: No Does the patient have difficulty concentrating, remembering, or making decisions?: No Patient able to express need for assistance with ADLs?: Yes Does the patient have difficulty dressing or bathing?: No Independently performs ADLs?: Yes (appropriate for developmental age) Does the patient have difficulty walking or climbing stairs?: No Weakness of Legs: None Weakness of Arms/Hands: None  Permission Sought/Granted                  Emotional  Assessment       Orientation: : Oriented to Self, Oriented to Place, Oriented to  Time, Oriented to Situation      Admission diagnosis:  Respiratory distress [R06.03] COPD exacerbation ([place]) [J44.1] Acute respiratory failure with hypoxia ([place]) [J96.01] Patient Active Problem List   Diagnosis Date Noted   Acute on chronic diastolic CHF (congestive heart failure) ([place]) 08/27/2022   COPD exacerbation ([place]) 08/23/2022   Chronic a-fib ([place]) 08/22/2022   Respiratory distress 08/22/2022   Atrial fibrillation with rapid ventricular response ([place]) 06/06/2022   Rapid atrial fibrillation, new onset(HCC) 06/05/2022   Diabetes mellitus without complication ([place])    Stage 3b chronic kidney disease ([place])    Lactic acidosis    Postural dizziness with presyncope    Community acquired pneumonia    Hypomagnesemia    Sepsis ([place]) 09/03/2020   Acute cystitis without hematuria    Troponin I above reference range    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   Closed left hip fracture ([place]) 01/25/2017   S/P hardware removal 01/17/2017   Asthma, chronic 02/10/2015   Acute on chronic respiratory failure ([place]) 02/10/2015   Atelectasis 02/10/2015   Hypoxia    Shortness of breath    Femur fracture, left ([place]) 02/03/2015   Hypertension 02/03/2015   Essential hypertension 03/19/1989   Type II diabetes mellitus ([place]) 03/19/1989  PCP:  Gladstone Lighter, MD [id number] [id number]: [phone]: [phone] [id number] [id number]: [phone]: [phone]   Social Determinants of Health (SDOH) Social History: SDOH Screenings   Food Insecurity: No Food Insecurity (08/23/2022)  Housing: Low Risk  (08/23/2022)  Transportation Needs: No Transportation Needs (08/23/2022)  Utilities: Not At Risk (08/23/2022)   Tobacco Use: Low Risk  (08/25/2022)   SDOH Interventions:     Readmission Risk Interventions    08/27/2022   12:05 PM  Readmission Risk Prevention Plan  Transportation Screening Complete  PCP or Specialist Appt within 3-5 Days Complete  HRI or Home Care Consult Complete  Medication Review (RN Care Manager) Complete

## 2022-08-27 NOTE — Inpatient Diabetes Management (Signed)
Inpatient Diabetes Program Recommendations  AACE/ADA: New Consensus Statement on Inpatient Glycemic Control (2015)  Target Ranges:  Prepandial:   less than 140 mg/dL      Peak postprandial:   less than 180 mg/dL (1-2 hours)      Critically ill patients:  140 - 180 mg/dL    Latest Reference Range & Units 08/26/22 08:11 08/26/22 12:46 08/26/22 16:07 08/26/22 20:53  Glucose-Capillary 70 - 99 mg/dL 175 (H)  3 units Novolog '@0940'$   8 units Semglee '@0921'$  205 (H)  5 units Novolog '@1402'$  234 (H)  5 units Novolog  346 (H)  4 units Novolog '@2207'$   (H): Data is abnormally high  Latest Reference Range & Units 08/27/22 08:16  Glucose-Capillary 70 - 99 mg/dL 195 (H)  (H): Data is abnormally high    Home DM Meds:Glipizide XL 2.5 mg daily   Current Orders: Semglee 8 units daily  Novolog 0-15 units TID ac/hs     Getting Prednisone 40 mg daily    MD- Note Started Semglee yest AM.  CBG 195 this AM.  Afternoon CBGs >200 yesterday afternoon.  Please consider:  1. Increase Semglee slightly to 10 units Daily  2. Start Novolog Meal Coverage: Novolog 3 units TID with meals HOLD if pt NPO HOLD if pt eats <50% meals       --Will follow patient during hospitalization--  Wyn Quaker RN, MSN, [place] Diabetes Coordinator Inpatient Glycemic Control Team Team Pager: [phone] (8a-5p)

## 2022-09-04 DIAGNOSIS — J441 Chronic obstructive pulmonary disease with (acute) exacerbation: Secondary | ICD-10-CM | POA: Diagnosis not present

## 2022-09-04 DIAGNOSIS — K449 Diaphragmatic hernia without obstruction or gangrene: Secondary | ICD-10-CM | POA: Diagnosis not present

## 2022-09-04 DIAGNOSIS — J45901 Unspecified asthma with (acute) exacerbation: Secondary | ICD-10-CM | POA: Diagnosis not present

## 2022-09-04 DIAGNOSIS — R1310 Dysphagia, unspecified: Secondary | ICD-10-CM | POA: Diagnosis not present

## 2022-09-04 DIAGNOSIS — R5383 Other fatigue: Secondary | ICD-10-CM | POA: Diagnosis not present

## 2022-09-04 DIAGNOSIS — Z09 Encounter for follow-up examination after completed treatment for conditions other than malignant neoplasm: Secondary | ICD-10-CM | POA: Diagnosis not present

## 2022-09-04 DIAGNOSIS — E041 Nontoxic single thyroid nodule: Secondary | ICD-10-CM | POA: Diagnosis not present

## 2022-09-19 ENCOUNTER — Other Ambulatory Visit: Payer: Self-pay | Admitting: Gastroenterology

## 2022-09-19 DIAGNOSIS — R1310 Dysphagia, unspecified: Secondary | ICD-10-CM

## 2022-09-26 ENCOUNTER — Ambulatory Visit
Admission: RE | Admit: 2022-09-26 | Discharge: 2022-09-26 | Disposition: A | Discharge status: Home / Self Care | Payer: Medicare HMO | Source: Ambulatory Visit | Attending: Gastroenterology | Admitting: Gastroenterology

## 2022-09-26 DIAGNOSIS — K224 Dyskinesia of esophagus: Secondary | ICD-10-CM | POA: Diagnosis not present

## 2022-09-26 DIAGNOSIS — R1310 Dysphagia, unspecified: Secondary | ICD-10-CM | POA: Insufficient documentation

## 2022-09-26 DIAGNOSIS — K219 Gastro-esophageal reflux disease without esophagitis: Secondary | ICD-10-CM | POA: Diagnosis not present

## 2022-10-02 DIAGNOSIS — Z9049 Acquired absence of other specified parts of digestive tract: Secondary | ICD-10-CM | POA: Diagnosis not present

## 2022-10-02 DIAGNOSIS — K862 Cyst of pancreas: Secondary | ICD-10-CM | POA: Diagnosis not present

## 2022-10-02 DIAGNOSIS — K7460 Unspecified cirrhosis of liver: Secondary | ICD-10-CM | POA: Diagnosis not present

## 2022-10-02 DIAGNOSIS — K449 Diaphragmatic hernia without obstruction or gangrene: Secondary | ICD-10-CM | POA: Diagnosis not present

## 2022-10-02 DIAGNOSIS — K219 Gastro-esophageal reflux disease without esophagitis: Secondary | ICD-10-CM | POA: Diagnosis not present

## 2022-10-02 DIAGNOSIS — K222 Esophageal obstruction: Secondary | ICD-10-CM | POA: Diagnosis not present

## 2022-10-02 DIAGNOSIS — K591 Functional diarrhea: Secondary | ICD-10-CM | POA: Diagnosis not present

## 2022-10-02 DIAGNOSIS — K224 Dyskinesia of esophagus: Secondary | ICD-10-CM | POA: Diagnosis not present

## 2022-10-02 DIAGNOSIS — R1319 Other dysphagia: Secondary | ICD-10-CM | POA: Diagnosis not present

## 2022-10-03 ENCOUNTER — Other Ambulatory Visit: Payer: Self-pay | Admitting: Gastroenterology

## 2022-10-03 DIAGNOSIS — K7030 Alcoholic cirrhosis of liver without ascites: Secondary | ICD-10-CM

## 2022-10-04 DIAGNOSIS — I10 Essential (primary) hypertension: Secondary | ICD-10-CM | POA: Diagnosis not present

## 2022-10-04 DIAGNOSIS — I495 Sick sinus syndrome: Secondary | ICD-10-CM | POA: Diagnosis not present

## 2022-10-04 DIAGNOSIS — J449 Chronic obstructive pulmonary disease, unspecified: Secondary | ICD-10-CM | POA: Diagnosis not present

## 2022-10-04 DIAGNOSIS — I4891 Unspecified atrial fibrillation: Secondary | ICD-10-CM | POA: Diagnosis not present

## 2022-10-04 DIAGNOSIS — E119 Type 2 diabetes mellitus without complications: Secondary | ICD-10-CM | POA: Diagnosis not present

## 2022-10-04 DIAGNOSIS — N1832 Chronic kidney disease, stage 3b: Secondary | ICD-10-CM | POA: Diagnosis not present

## 2022-10-16 DIAGNOSIS — R000 Tachycardia, unspecified: Secondary | ICD-10-CM | POA: Diagnosis not present

## 2022-10-16 DIAGNOSIS — J301 Allergic rhinitis due to pollen: Secondary | ICD-10-CM | POA: Diagnosis not present

## 2022-10-16 DIAGNOSIS — J449 Chronic obstructive pulmonary disease, unspecified: Secondary | ICD-10-CM | POA: Diagnosis not present

## 2022-11-05 ENCOUNTER — Other Ambulatory Visit
Admission: RE | Admit: 2022-11-05 | Discharge: 2022-11-05 | Disposition: A | Discharge status: Home / Self Care | Payer: Medicare HMO | Source: Ambulatory Visit | Attending: Internal Medicine | Admitting: Internal Medicine

## 2022-11-05 DIAGNOSIS — Z01818 Encounter for other preprocedural examination: Secondary | ICD-10-CM | POA: Diagnosis not present

## 2022-11-05 DIAGNOSIS — J4530 Mild persistent asthma, uncomplicated: Secondary | ICD-10-CM | POA: Diagnosis not present

## 2022-11-05 DIAGNOSIS — Z01811 Encounter for preprocedural respiratory examination: Secondary | ICD-10-CM | POA: Diagnosis not present

## 2022-11-05 DIAGNOSIS — R0602 Shortness of breath: Secondary | ICD-10-CM | POA: Insufficient documentation

## 2022-11-05 DIAGNOSIS — I10 Essential (primary) hypertension: Secondary | ICD-10-CM | POA: Diagnosis not present

## 2022-11-05 DIAGNOSIS — J441 Chronic obstructive pulmonary disease with (acute) exacerbation: Secondary | ICD-10-CM | POA: Diagnosis not present

## 2022-11-05 DIAGNOSIS — I4891 Unspecified atrial fibrillation: Secondary | ICD-10-CM | POA: Diagnosis not present

## 2022-11-05 DIAGNOSIS — E119 Type 2 diabetes mellitus without complications: Secondary | ICD-10-CM | POA: Diagnosis not present

## 2022-11-05 DIAGNOSIS — J449 Chronic obstructive pulmonary disease, unspecified: Secondary | ICD-10-CM | POA: Diagnosis not present

## 2022-11-05 DIAGNOSIS — I495 Sick sinus syndrome: Secondary | ICD-10-CM | POA: Insufficient documentation

## 2022-11-05 DIAGNOSIS — N1832 Chronic kidney disease, stage 3b: Secondary | ICD-10-CM | POA: Diagnosis not present

## 2022-11-05 LAB — BRAIN NATRIURETIC PEPTIDE: B Natriuretic Peptide: 391.3 pg/mL — ABNORMAL HIGH (ref 0.0–100.0)

## 2022-11-13 ENCOUNTER — Other Ambulatory Visit: Payer: Self-pay

## 2022-11-13 ENCOUNTER — Encounter: Payer: Self-pay | Admitting: Cardiology

## 2022-11-13 ENCOUNTER — Ambulatory Visit
Admission: RE | Admit: 2022-11-13 | Discharge: 2022-11-13 | Disposition: A | Discharge status: Home / Self Care | Payer: Medicare HMO | Attending: Cardiology | Admitting: Cardiology

## 2022-11-13 DIAGNOSIS — Z01818 Encounter for other preprocedural examination: Secondary | ICD-10-CM

## 2022-11-13 DIAGNOSIS — I495 Sick sinus syndrome: Secondary | ICD-10-CM

## 2022-11-13 MED ORDER — SODIUM CHLORIDE 0.9 % IV SOLN: INTRAVENOUS | Status: DC

## 2022-11-13 MED ORDER — SODIUM CHLORIDE 0.9 % IV SOLN
80.0000 mg | INTRAVENOUS | Status: DC
  Filled 2022-11-13: qty 2

## 2022-11-13 MED ORDER — CEFAZOLIN SODIUM-DEXTROSE 2-4 GM/100ML-% IV SOLN: 2.0000 g | INTRAVENOUS | Status: DC

## 2022-11-13 NOTE — Progress Notes (Signed)
Patient took eliquis dose this am.  Dr. Darrold Junker notified and at bedside, rescheduled for Thursday am.  Patient was not notified with pre procedure instructions by [facility] clinic

## 2022-11-15 ENCOUNTER — Other Ambulatory Visit: Payer: Self-pay

## 2022-11-15 ENCOUNTER — Observation Stay
Admission: RE | Admit: 2022-11-15 | Discharge: 2022-11-16 | Disposition: A | Discharge status: Home / Self Care | Payer: Medicare HMO | Attending: Cardiology | Admitting: Cardiology

## 2022-11-15 ENCOUNTER — Encounter
Admission: RE | Disposition: A | Discharge status: Home / Self Care | Payer: Self-pay | Source: Home / Self Care | Attending: Cardiology

## 2022-11-15 ENCOUNTER — Observation Stay: Payer: Medicare HMO

## 2022-11-15 ENCOUNTER — Encounter: Payer: Self-pay | Admitting: Cardiology

## 2022-11-15 ENCOUNTER — Encounter: Admission: RE | Payer: Self-pay | Source: Home / Self Care

## 2022-11-15 ENCOUNTER — Ambulatory Visit: Admission: RE | Admit: 2022-11-15 | Payer: Medicare HMO | Source: Home / Self Care | Admitting: Cardiology

## 2022-11-15 DIAGNOSIS — I129 Hypertensive chronic kidney disease with stage 1 through stage 4 chronic kidney disease, or unspecified chronic kidney disease: Secondary | ICD-10-CM | POA: Diagnosis not present

## 2022-11-15 DIAGNOSIS — Z96642 Presence of left artificial hip joint: Secondary | ICD-10-CM | POA: Diagnosis not present

## 2022-11-15 DIAGNOSIS — I480 Paroxysmal atrial fibrillation: Secondary | ICD-10-CM | POA: Insufficient documentation

## 2022-11-15 DIAGNOSIS — Z96612 Presence of left artificial shoulder joint: Secondary | ICD-10-CM | POA: Diagnosis not present

## 2022-11-15 DIAGNOSIS — I495 Sick sinus syndrome: Principal | ICD-10-CM | POA: Diagnosis present

## 2022-11-15 DIAGNOSIS — N1832 Chronic kidney disease, stage 3b: Secondary | ICD-10-CM | POA: Insufficient documentation

## 2022-11-15 DIAGNOSIS — J449 Chronic obstructive pulmonary disease, unspecified: Secondary | ICD-10-CM | POA: Diagnosis not present

## 2022-11-15 DIAGNOSIS — Z7901 Long term (current) use of anticoagulants: Secondary | ICD-10-CM | POA: Insufficient documentation

## 2022-11-15 DIAGNOSIS — Z96653 Presence of artificial knee joint, bilateral: Secondary | ICD-10-CM | POA: Insufficient documentation

## 2022-11-15 DIAGNOSIS — E1122 Type 2 diabetes mellitus with diabetic chronic kidney disease: Secondary | ICD-10-CM | POA: Insufficient documentation

## 2022-11-15 DIAGNOSIS — Z79899 Other long term (current) drug therapy: Secondary | ICD-10-CM | POA: Insufficient documentation

## 2022-11-15 DIAGNOSIS — Z01810 Encounter for preprocedural cardiovascular examination: Secondary | ICD-10-CM

## 2022-11-15 DIAGNOSIS — Z950 Presence of cardiac pacemaker: Secondary | ICD-10-CM | POA: Diagnosis not present

## 2022-11-15 DIAGNOSIS — Z01818 Encounter for other preprocedural examination: Secondary | ICD-10-CM

## 2022-11-15 DIAGNOSIS — J45909 Unspecified asthma, uncomplicated: Secondary | ICD-10-CM | POA: Insufficient documentation

## 2022-11-15 DIAGNOSIS — Z7984 Long term (current) use of oral hypoglycemic drugs: Secondary | ICD-10-CM | POA: Insufficient documentation

## 2022-11-15 HISTORY — PX: PACEMAKER IMPLANT: EP1218

## 2022-11-15 LAB — GLUCOSE, CAPILLARY: Glucose-Capillary: 175 mg/dL — ABNORMAL HIGH (ref 70–99)

## 2022-11-15 SURGERY — PACEMAKER IMPLANT
Anesthesia: Moderate Sedation

## 2022-11-15 MED ORDER — DILTIAZEM HCL ER COATED BEADS 120 MG PO CP24: 240.0000 mg | ORAL_CAPSULE | Freq: Every day | ORAL | Status: DC

## 2022-11-15 MED ORDER — SODIUM CHLORIDE 0.9 % IV SOLN
80.0000 mg | INTRAVENOUS | Status: AC
  Administered 2022-11-15: 80 mg
  Filled 2022-11-15: qty 2

## 2022-11-15 MED ORDER — DILTIAZEM HCL ER COATED BEADS 120 MG PO CP24
240.0000 mg | ORAL_CAPSULE | Freq: Every day | ORAL | Status: DC
  Administered 2022-11-15 – 2022-11-16 (×2): 240 mg via ORAL
  Filled 2022-11-15 (×3): qty 2

## 2022-11-15 MED ORDER — TRAMADOL HCL 50 MG PO TABS
50.0000 mg | ORAL_TABLET | Freq: Four times a day (QID) | ORAL | Status: DC | PRN
  Administered 2022-11-15: 50 mg via ORAL
  Filled 2022-11-15: qty 1

## 2022-11-15 MED ORDER — ALBUTEROL SULFATE (2.5 MG/3ML) 0.083% IN NEBU
3.0000 mL | INHALATION_SOLUTION | Freq: Four times a day (QID) | RESPIRATORY_TRACT | Status: DC | PRN
  Administered 2022-11-15: 3 mL via RESPIRATORY_TRACT
  Filled 2022-11-15: qty 3

## 2022-11-15 MED ORDER — AMIODARONE HCL 200 MG PO TABS: 200.0000 mg | ORAL_TABLET | Freq: Every day | ORAL | Status: DC

## 2022-11-15 MED ORDER — FUROSEMIDE 20 MG PO TABS
20.0000 mg | ORAL_TABLET | Freq: Every day | ORAL | Status: DC
  Administered 2022-11-15 – 2022-11-16 (×2): 20 mg via ORAL
  Filled 2022-11-15 (×3): qty 1

## 2022-11-15 MED ORDER — MIDAZOLAM HCL 2 MG/2ML IJ SOLN
INTRAMUSCULAR | Status: AC
  Filled 2022-11-15: qty 2

## 2022-11-15 MED ORDER — TRAZODONE HCL 100 MG PO TABS
100.0000 mg | ORAL_TABLET | Freq: Every evening | ORAL | Status: DC | PRN
  Administered 2022-11-15: 100 mg via ORAL
  Filled 2022-11-15: qty 1

## 2022-11-15 MED ORDER — MIDAZOLAM HCL 2 MG/2ML IJ SOLN
INTRAMUSCULAR | Status: DC | PRN
  Administered 2022-11-15 (×2): .5 mg via INTRAVENOUS

## 2022-11-15 MED ORDER — ACETAMINOPHEN 325 MG PO TABS: 325.0000 mg | ORAL_TABLET | ORAL | Status: DC | PRN

## 2022-11-15 MED ORDER — FENTANYL CITRATE (PF) 100 MCG/2ML IJ SOLN
INTRAMUSCULAR | Status: AC
  Filled 2022-11-15: qty 2

## 2022-11-15 MED ORDER — CEFAZOLIN SODIUM-DEXTROSE 2-4 GM/100ML-% IV SOLN
2.0000 g | INTRAVENOUS | Status: AC
  Administered 2022-11-15: 2 g via INTRAVENOUS

## 2022-11-15 MED ORDER — SODIUM CHLORIDE 0.9 % IV SOLN: INTRAVENOUS | Status: DC

## 2022-11-15 MED ORDER — LIDOCAINE HCL (PF) 1 % IJ SOLN
INTRAMUSCULAR | Status: DC | PRN
  Administered 2022-11-15: 30 mL

## 2022-11-15 MED ORDER — GLIPIZIDE ER 2.5 MG PO TB24
2.5000 mg | ORAL_TABLET | Freq: Every day | ORAL | Status: DC
  Administered 2022-11-16: 2.5 mg via ORAL
  Filled 2022-11-15: qty 1

## 2022-11-15 MED ORDER — COLESTIPOL HCL 1 G PO TABS
1.0000 g | ORAL_TABLET | Freq: Two times a day (BID) | ORAL | Status: DC
  Administered 2022-11-15 – 2022-11-16 (×3): 1 g via ORAL
  Filled 2022-11-15 (×3): qty 1

## 2022-11-15 MED ORDER — FENTANYL CITRATE (PF) 100 MCG/2ML IJ SOLN
INTRAMUSCULAR | Status: DC | PRN
  Administered 2022-11-15 (×2): 12.5 ug via INTRAVENOUS

## 2022-11-15 MED ORDER — HEPARIN (PORCINE) IN NACL 1000-0.9 UT/500ML-% IV SOLN
INTRAVENOUS | Status: DC | PRN
  Administered 2022-11-15: 500 mL

## 2022-11-15 MED ORDER — LIDOCAINE HCL 1 % IJ SOLN
INTRAMUSCULAR | Status: AC
  Filled 2022-11-15: qty 60

## 2022-11-15 MED ORDER — CEFAZOLIN SODIUM-DEXTROSE 2-4 GM/100ML-% IV SOLN
INTRAVENOUS | Status: AC
  Filled 2022-11-15: qty 100

## 2022-11-15 MED ORDER — IOHEXOL 300 MG/ML  SOLN
INTRAMUSCULAR | Status: DC | PRN
  Administered 2022-11-15: 10 mL

## 2022-11-15 MED ORDER — CEFAZOLIN SODIUM-DEXTROSE 1-4 GM/50ML-% IV SOLN
1.0000 g | Freq: Four times a day (QID) | INTRAVENOUS | Status: AC
  Administered 2022-11-15 – 2022-11-16 (×3): 1 g via INTRAVENOUS
  Filled 2022-11-15 (×3): qty 50

## 2022-11-15 MED ORDER — FUROSEMIDE 20 MG PO TABS: 20.0000 mg | ORAL_TABLET | Freq: Every day | ORAL | Status: DC

## 2022-11-15 MED ORDER — CHLORHEXIDINE GLUCONATE CLOTH 2 % EX PADS
6.0000 | MEDICATED_PAD | Freq: Every day | CUTANEOUS | Status: DC
  Administered 2022-11-15: 6 via TOPICAL

## 2022-11-15 MED ORDER — AMIODARONE HCL 200 MG PO TABS
200.0000 mg | ORAL_TABLET | Freq: Every day | ORAL | Status: DC
  Administered 2022-11-15 – 2022-11-16 (×2): 200 mg via ORAL
  Filled 2022-11-15 (×3): qty 1

## 2022-11-15 MED ORDER — ONDANSETRON HCL 4 MG/2ML IJ SOLN: 4.0000 mg | Freq: Four times a day (QID) | INTRAMUSCULAR | Status: DC | PRN

## 2022-11-15 SURGICAL SUPPLY — 19 items
CABLE SURG 12 DISP A/V CHANNEL (MISCELLANEOUS) IMPLANT
DEVICE DSSCT PLSMBLD 3.0S LGHT (MISCELLANEOUS) IMPLANT
DRAPE INCISE 23X17 STRL (DRAPES) IMPLANT
DRAPE INCISE IOBAN 23X17 STRL (DRAPES) ×1 IMPLANT
ELECT REM PT RETURN 9FT ADLT (ELECTROSURGICAL) ×1
ELECTRODE REM PT RTRN 9FT ADLT (ELECTROSURGICAL) IMPLANT
INTRO PACEMKR SHEATH II 7FR (MISCELLANEOUS) ×1
INTRODUCER PACEMKR SHTH II 7FR (MISCELLANEOUS) IMPLANT
LEAD INGEVITY 7841 52 (Lead) IMPLANT
LEAD INGEVITY 7842 59 (Lead) IMPLANT
PACEMAKER ACCOLADE DR-EL (Pacemaker) IMPLANT
PAD ELECT DEFIB RADIOL ZOLL (MISCELLANEOUS) IMPLANT
PLASMABLADE 3.0S W/LIGHT (MISCELLANEOUS) ×1
SHEATH 9FR PRELUDE SNAP 13 (SHEATH) IMPLANT
SLING ARM IMMOBILIZER LRG (SOFTGOODS) IMPLANT
SUT SILK 0 FSL (SUTURE) IMPLANT
SUT VIC AB 2-0 CT2 27 (SUTURE) IMPLANT
SUT VIC AB 4-0 PS2 18 (SUTURE) IMPLANT
TRAY PACEMAKER INSERTION (PACKS) ×1 IMPLANT

## 2022-11-15 NOTE — Discharge Instructions (Addendum)
For 6 weeks, avoid lifting greater than 15 pounds or raising your left arm above your head. Please do not shower until tomorrow and do not submerge your left chest in water (no baths, swimming) for at least 1 week or until you follow up with Dr. Eusebio Friendly can remove the clear bandage on your left chest if it starts to peel off, but please do NOT take off the steri strips underneath (thin rectangular strips). Take all your medicines as prescribed, including the antibiotic I have prescribed called  Keflex (cefalexin). Please call Dr. Juliann Pares 's office at [facility] [phone]) or if you have any questions, concerns, or problems before your scheduled appointment.   Please check your blood pressure at home once in the morning and once in the evening and bring a log of your blood pressure numbers to your next appointment with Dr. Juliann Pares. I have added an additional blood pressure medication called metoprolol succinate 25mg  to take once per day in addition to your other medications.

## 2022-11-15 NOTE — Discharge Summary (Signed)
Discharge Summary      Patient ID: Leslie Parker MRN: [id number] DOB/AGE: June 28, 1933 89 y.o.  Admit date: 11/15/2022 Discharge date: 11/16/2022  Primary Discharge Diagnosis sick sinus syndrome Secondary Discharge Diagnosis s/p [facility] Pacemaker   Significant Diagnostic Studies: DC PPM initial implant - Dr. Marcina Millard 11/15/2022  Conclusion Successful dual-chamber pacemaker generator implantation   Procedural Details  Technical Details The left chest was prepped and draped in the usual sterile manner.  Anesthesia was obtained 1% lidocaine locally.  6 cm incision was performed the left pectoral region.  The pacemaker pocket was generated by electrocautery and blunt dissection.  MRI compatible leads were positioned in the right ventricular apical septum [facility] Scientific Ingevity [id number]) and a right atrial appendage [facility] Scientific Ingevity [phone]) under fluoroscopic guidance.  After proper thresholds were obtained the leads were sutured in place.  The pacemaker pocket was irrigated gentamicin solution.  The leads were connected to a MRI compatible dual-chamber pacemaker generator [facility] Scientific Accolade MRI EL [phone]) and positioned into the pocket the pocket was closed with 2-0 and 4-0 Vicryl, respectively.  Steri-Strips and a pressure dressing were applied.  The patient received 1 mg of Versed and 25 mcg of fentanyl.  There were no periprocedural complications.  Postprocedural interrogation revealed atrial lead impedance 600 ohms intrinsic amplitude 2.0 mV.  Ventricular lead and Peden's was 1200 ohms, intrinsic amplitude 15.0 mV, threshold 1.1 V at 0.5 ms pulse width. Estimated blood loss <50 mL.   During this procedure medications were administered to achieve and maintain moderate conscious sedation while the patient's heart rate, blood pressure, and oxygen saturation were continuously monitored and I was present face-to-face 100% of  this time.   Consults: none  Hospital Course: The patient was brought to the cardiac cath lab and underwent [facility] Scientific dual-chamber permanent pacemaker placement with Dr. Marcina Millard on 11/15/2022. The patient tolerated with procedure well without periprocedural complications. The device was interrogated post procedure and is functioning appropriately with good threshold and lead impedance.  Postprocedural chest x-ray was without pneumothorax, showed chronic lung scarring and post-op R shoulder arthroplasty. Hospital course was uneventful without complications. On day of discharge 4/26 the patient was feeling well, ambulatory, and eager to go home. BP was elevated despite her home cardizem CD 240 daily, amiodarone 200mg  daily, and lasix 20mg  daily, so addition of metoprolol XL 25mg  daily was made. The patient was instructed to check her BP at home and keep a log of the readings twice daily and bring to her follow up appointment. She was given aftercare instructions and ER precautions. She will follow up in clinic with Dr. Juliann Pares on Friday, May 3 at 10am, or sooner if needed. She knows to call the office with questions or concerns in interim.   Discharge Exam: Blood pressure (!) 179/80, pulse 71, temperature (!) 97.5 F (36.4 C), temperature source Oral, resp. rate 20, height 5\' 9"  (1.753 m), weight 81.2 kg, SpO2 96 %.    PHYSICAL EXAM General: Pleasant elderly caucasian female, well nourished, in no acute distress.  HEENT:  Normocephalic and atraumatic. Neck:   No JVD.  Chest: L infraclavicular chest with clean dry abd pad + tegaderm intact, without significant tenderness to palpation, ecchymosis, active bleeding, or evidence of infection  Lungs: Normal respiratory effort on room air.  Clear to ascultation bilaterally. Heart: HRRR . Normal S1 and S2 without gallops or murmurs. Abdomen: non-distended appearing.  Msk: Normal strength and tone for age. Extremities:  No clubbing,  cyanosis, edema.  Neuro: Alert and oriented x3 Psych:  mood appropriate for situation.    Labs:   Lab Results  Component Value Date   WBC 15.3 (H) 08/26/2022   HGB 12.3 08/26/2022   HCT 35.8 (L) 08/26/2022   MCV 89.1 08/26/2022   PLT 295 08/26/2022    Radiology:  CXR - COMPARISON:  08/22/2022   FINDINGS: Transverse diameter of heart is increased. There are no signs of alveolar pulmonary edema. Linear densities are seen in medial left lower lung field. There is interval placement of pacemaker battery in the left infraclavicular region with tips of leads in right atrium and right ventricle. There is previous reverse arthroplasty right shoulder.   IMPRESSION: Interval placement of pacemaker battery in the left infraclavicular region. There is no pleural effusion or pneumothorax. Linear densities in medial left lower lung field may suggest scarring or subsegmental atelectasis.     Electronically Signed   By: Ernie Avena M.D.   On: 11/15/2022 14:44  FOLLOW UP PLANS AND APPOINTMENTS  Allergies as of 11/16/2022       Reactions   Baclofen Other (See Comments)   Elevated Cr        Medication List     STOP taking these medications    omeprazole 20 MG capsule Commonly known as: PRILOSEC   omeprazole 40 MG capsule Commonly known as: PRILOSEC   traMADol 50 MG tablet Commonly known as: ULTRAM       TAKE these medications    albuterol 108 (90 Base) MCG/ACT inhaler Commonly known as: VENTOLIN HFA Inhale 2 puffs into the lungs every 6 (six) hours as needed.   amiodarone 200 MG tablet Commonly known as: PACERONE Take 200 mg by mouth daily.   apixaban 2.5 MG Tabs tablet Commonly known as: ELIQUIS Take 1 tablet (2.5 mg total) by mouth 2 (two) times daily.   ascorbic acid 250 MG Chew Commonly known as: VITAMIN C Chew 250 mg by mouth daily.   B-12 2500 MCG Tabs Take 2,500 mcg by mouth daily.   cephALEXin 500 MG capsule Commonly known as:  Keflex Take 1 capsule (500 mg total) by mouth 2 (two) times daily for 10 days.   colestipol 1 g tablet Commonly known as: COLESTID Take 1 tablet by mouth 2 (two) times daily.   diltiazem 240 MG 24 hr capsule Commonly known as: CARDIZEM CD Take 1 capsule (240 mg total) by mouth daily. What changed: Another medication with the same name was removed. Continue taking this medication, and follow the directions you see here.   furosemide 20 MG tablet Commonly known as: LASIX Take 20 mg by mouth daily.   glipiZIDE 2.5 MG 24 hr tablet Commonly known as: GLUCOTROL XL Take 2.5 mg by mouth daily with breakfast.   ipratropium-albuterol 0.5-2.5 (3) MG/3ML Soln Commonly known as: DUONEB Inhale 3 mLs into the lungs every 6 (six) hours as needed.   Magnesium Oxide -Mg Supplement 500 MG Caps Take 500 mg by mouth daily.   metoprolol succinate 25 MG 24 hr tablet Commonly known as: TOPROL-XL Take 1 tablet (25 mg total) by mouth daily.   multivitamin with minerals Tabs tablet Take 1 tablet by mouth daily. One-A-Day Women's Vitamin   traZODone 100 MG tablet Commonly known as: DESYREL Take 100 mg by mouth at bedtime.   Trelegy Ellipta 100-62.5-25 MCG/ACT Aepb Generic drug: Fluticasone-Umeclidin-Vilant Inhale 1 puff into the lungs daily.        Follow-up Information  Callwood, Dwayne D, MD. Go in 1 week(s).   Specialties: Cardiology, Internal Medicine Why: Appointment on Friday, 11/23/2022 at 10:00am. Contact information: [address] [place] [address] [phone]                 PLEASE BRING ALL MEDICATIONS WITH YOU TO FOLLOW UP APPOINTMENTS  Time spent with patient: >30 mins Signed:  Rebeca Allegra PA-C 11/16/2022, 11:53 AM  [facility] Cardiology

## 2022-11-15 NOTE — Progress Notes (Signed)
Patient did well in specials, denies pain, sob, and nausea, vss, tolerated food and drink, was able to void, rep for pacemaker at bedside and spoke with patient and granddaughter, paged Dr Darrold Junker and team about BP, did not receive an answer, patients BP did start coming down on its own, gave report to [facility] RN on 2A and transported patient in stable condition to room 240, came back from transport and had message from [place] of Dr Darrold Junker and pharmacy stating meds were tubed to specials, will tube meds to 2A, otherwise patient did well and comfortably in room.  Vidal Schwalbe, RN

## 2022-11-16 ENCOUNTER — Encounter: Payer: Self-pay | Admitting: Cardiology

## 2022-11-16 DIAGNOSIS — I495 Sick sinus syndrome: Secondary | ICD-10-CM | POA: Diagnosis not present

## 2022-11-16 DIAGNOSIS — I10 Essential (primary) hypertension: Secondary | ICD-10-CM | POA: Diagnosis not present

## 2022-11-16 DIAGNOSIS — E1122 Type 2 diabetes mellitus with diabetic chronic kidney disease: Secondary | ICD-10-CM | POA: Diagnosis not present

## 2022-11-16 DIAGNOSIS — N1832 Chronic kidney disease, stage 3b: Secondary | ICD-10-CM | POA: Diagnosis not present

## 2022-11-16 DIAGNOSIS — Z950 Presence of cardiac pacemaker: Secondary | ICD-10-CM | POA: Diagnosis not present

## 2022-11-16 DIAGNOSIS — Z96653 Presence of artificial knee joint, bilateral: Secondary | ICD-10-CM | POA: Diagnosis not present

## 2022-11-16 DIAGNOSIS — Z9861 Coronary angioplasty status: Secondary | ICD-10-CM | POA: Diagnosis not present

## 2022-11-16 DIAGNOSIS — Z96612 Presence of left artificial shoulder joint: Secondary | ICD-10-CM | POA: Diagnosis not present

## 2022-11-16 DIAGNOSIS — J45909 Unspecified asthma, uncomplicated: Secondary | ICD-10-CM | POA: Diagnosis not present

## 2022-11-16 DIAGNOSIS — I480 Paroxysmal atrial fibrillation: Secondary | ICD-10-CM | POA: Diagnosis not present

## 2022-11-16 DIAGNOSIS — J449 Chronic obstructive pulmonary disease, unspecified: Secondary | ICD-10-CM | POA: Diagnosis not present

## 2022-11-16 DIAGNOSIS — I129 Hypertensive chronic kidney disease with stage 1 through stage 4 chronic kidney disease, or unspecified chronic kidney disease: Secondary | ICD-10-CM | POA: Diagnosis not present

## 2022-11-16 MED ORDER — APIXABAN 2.5 MG PO TABS: 2.5000 mg | ORAL_TABLET | Freq: Two times a day (BID) | ORAL | Status: DC

## 2022-11-16 MED ORDER — METOPROLOL SUCCINATE ER 25 MG PO TB24: 25.0000 mg | ORAL_TABLET | Freq: Every day | ORAL | 1 refills | Status: DC

## 2022-11-16 MED ORDER — CEPHALEXIN 500 MG PO CAPS: 500.0000 mg | ORAL_CAPSULE | Freq: Two times a day (BID) | ORAL | 0 refills | Status: DC

## 2022-11-16 MED ORDER — METOPROLOL SUCCINATE ER 25 MG PO TB24: 25.0000 mg | ORAL_TABLET | Freq: Every day | ORAL | Status: DC

## 2022-11-16 MED ORDER — METOPROLOL SUCCINATE ER 25 MG PO TB24
25.0000 mg | ORAL_TABLET | Freq: Once | ORAL | Status: AC
  Administered 2022-11-16: 25 mg via ORAL
  Filled 2022-11-16: qty 1

## 2022-11-16 NOTE — Progress Notes (Signed)
  Transition of Care [facility]) Screening Note   Patient Details  Name: Leslie Parker Date of Birth: [birth year]   [facility]) CM/SW Contact:    Truddie Hidden, RN Phone Number: 11/16/2022, 11:07 AM    Transition of Care Department [facility]) has reviewed patient and no TOC needs have been identified at this time. We will continue to monitor patient advancement through interdisciplinary progression rounds. If new patient transition needs arise, please place a TOC consult.

## 2022-11-23 DIAGNOSIS — I4891 Unspecified atrial fibrillation: Secondary | ICD-10-CM | POA: Diagnosis not present

## 2022-11-23 DIAGNOSIS — Z950 Presence of cardiac pacemaker: Secondary | ICD-10-CM | POA: Diagnosis not present

## 2022-11-23 DIAGNOSIS — E119 Type 2 diabetes mellitus without complications: Secondary | ICD-10-CM | POA: Diagnosis not present

## 2022-11-23 DIAGNOSIS — I495 Sick sinus syndrome: Secondary | ICD-10-CM | POA: Diagnosis not present

## 2022-11-23 DIAGNOSIS — J449 Chronic obstructive pulmonary disease, unspecified: Secondary | ICD-10-CM | POA: Diagnosis not present

## 2022-11-23 DIAGNOSIS — I10 Essential (primary) hypertension: Secondary | ICD-10-CM | POA: Diagnosis not present

## 2022-11-23 DIAGNOSIS — J4530 Mild persistent asthma, uncomplicated: Secondary | ICD-10-CM | POA: Diagnosis not present

## 2022-11-24 ENCOUNTER — Inpatient Hospital Stay
Admission: EM | Admit: 2022-11-24 | Discharge: 2022-11-29 | DRG: 982 | Disposition: A | Discharge status: Home / Self Care | Payer: Medicare HMO | Attending: Internal Medicine | Admitting: Internal Medicine

## 2022-11-24 ENCOUNTER — Emergency Department: Payer: Medicare HMO

## 2022-11-24 DIAGNOSIS — Z96611 Presence of right artificial shoulder joint: Secondary | ICD-10-CM | POA: Diagnosis present

## 2022-11-24 DIAGNOSIS — Z96642 Presence of left artificial hip joint: Secondary | ICD-10-CM | POA: Diagnosis present

## 2022-11-24 DIAGNOSIS — E860 Dehydration: Secondary | ICD-10-CM | POA: Diagnosis present

## 2022-11-24 DIAGNOSIS — R001 Bradycardia, unspecified: Secondary | ICD-10-CM | POA: Diagnosis present

## 2022-11-24 DIAGNOSIS — R531 Weakness: Secondary | ICD-10-CM | POA: Diagnosis not present

## 2022-11-24 DIAGNOSIS — N390 Urinary tract infection, site not specified: Secondary | ICD-10-CM | POA: Diagnosis not present

## 2022-11-24 DIAGNOSIS — I495 Sick sinus syndrome: Secondary | ICD-10-CM | POA: Diagnosis present

## 2022-11-24 DIAGNOSIS — E1122 Type 2 diabetes mellitus with diabetic chronic kidney disease: Secondary | ICD-10-CM | POA: Diagnosis not present

## 2022-11-24 DIAGNOSIS — N1832 Chronic kidney disease, stage 3b: Secondary | ICD-10-CM | POA: Diagnosis present

## 2022-11-24 DIAGNOSIS — Z79899 Other long term (current) drug therapy: Secondary | ICD-10-CM

## 2022-11-24 DIAGNOSIS — I480 Paroxysmal atrial fibrillation: Secondary | ICD-10-CM | POA: Diagnosis present

## 2022-11-24 DIAGNOSIS — Z950 Presence of cardiac pacemaker: Secondary | ICD-10-CM | POA: Diagnosis not present

## 2022-11-24 DIAGNOSIS — Z961 Presence of intraocular lens: Secondary | ICD-10-CM | POA: Diagnosis present

## 2022-11-24 DIAGNOSIS — I130 Hypertensive heart and chronic kidney disease with heart failure and stage 1 through stage 4 chronic kidney disease, or unspecified chronic kidney disease: Secondary | ICD-10-CM | POA: Diagnosis present

## 2022-11-24 DIAGNOSIS — J4489 Other specified chronic obstructive pulmonary disease: Secondary | ICD-10-CM | POA: Diagnosis present

## 2022-11-24 DIAGNOSIS — Z7901 Long term (current) use of anticoagulants: Secondary | ICD-10-CM

## 2022-11-24 DIAGNOSIS — Z8249 Family history of ischemic heart disease and other diseases of the circulatory system: Secondary | ICD-10-CM

## 2022-11-24 DIAGNOSIS — N189 Chronic kidney disease, unspecified: Secondary | ICD-10-CM | POA: Diagnosis present

## 2022-11-24 DIAGNOSIS — N183 Chronic kidney disease, stage 3 unspecified: Secondary | ICD-10-CM | POA: Diagnosis not present

## 2022-11-24 DIAGNOSIS — T82110A Breakdown (mechanical) of cardiac electrode, initial encounter: Secondary | ICD-10-CM | POA: Diagnosis present

## 2022-11-24 DIAGNOSIS — Y712 Prosthetic and other implants, materials and accessory cardiovascular devices associated with adverse incidents: Secondary | ICD-10-CM | POA: Diagnosis not present

## 2022-11-24 DIAGNOSIS — N179 Acute kidney failure, unspecified: Secondary | ICD-10-CM

## 2022-11-24 DIAGNOSIS — Z7984 Long term (current) use of oral hypoglycemic drugs: Secondary | ICD-10-CM

## 2022-11-24 DIAGNOSIS — Z96653 Presence of artificial knee joint, bilateral: Secondary | ICD-10-CM | POA: Diagnosis present

## 2022-11-24 DIAGNOSIS — R110 Nausea: Secondary | ICD-10-CM | POA: Diagnosis not present

## 2022-11-24 DIAGNOSIS — Z9842 Cataract extraction status, left eye: Secondary | ICD-10-CM

## 2022-11-24 DIAGNOSIS — I5032 Chronic diastolic (congestive) heart failure: Secondary | ICD-10-CM | POA: Diagnosis present

## 2022-11-24 DIAGNOSIS — Z888 Allergy status to other drugs, medicaments and biological substances status: Secondary | ICD-10-CM | POA: Diagnosis not present

## 2022-11-24 DIAGNOSIS — Z1152 Encounter for screening for COVID-19: Secondary | ICD-10-CM

## 2022-11-24 DIAGNOSIS — I10 Essential (primary) hypertension: Secondary | ICD-10-CM | POA: Diagnosis present

## 2022-11-24 DIAGNOSIS — I160 Hypertensive urgency: Secondary | ICD-10-CM | POA: Diagnosis present

## 2022-11-24 DIAGNOSIS — Z825 Family history of asthma and other chronic lower respiratory diseases: Secondary | ICD-10-CM

## 2022-11-24 DIAGNOSIS — Z9841 Cataract extraction status, right eye: Secondary | ICD-10-CM

## 2022-11-24 DIAGNOSIS — R55 Syncope and collapse: Secondary | ICD-10-CM | POA: Diagnosis not present

## 2022-11-24 HISTORY — DX: Chronic kidney disease, unspecified: N17.9

## 2022-11-24 HISTORY — DX: Chronic kidney disease, unspecified: N18.9

## 2022-11-24 LAB — BASIC METABOLIC PANEL
Anion gap: 11 (ref 5–15)
BUN: 57 mg/dL — ABNORMAL HIGH (ref 8–23)
CO2: 21 mmol/L — ABNORMAL LOW (ref 22–32)
Calcium: 8.2 mg/dL — ABNORMAL LOW (ref 8.9–10.3)
Chloride: 102 mmol/L (ref 98–111)
Creatinine, Ser: 2.44 mg/dL — ABNORMAL HIGH (ref 0.44–1.00)
GFR, Estimated: 19 mL/min — ABNORMAL LOW (ref >60)
Glucose, Bld: 217 mg/dL — ABNORMAL HIGH (ref 70–99)
Potassium: 4.4 mmol/L (ref 3.5–5.1)
Sodium: 134 mmol/L — ABNORMAL LOW (ref 135–145)

## 2022-11-24 LAB — CBC WITH DIFFERENTIAL/PLATELET
Abs Immature Granulocytes: 0.04 10*3/uL (ref 0.00–0.07)
Basophils Absolute: 0.1 10*3/uL (ref 0.0–0.1)
Basophils Relative: 1 %
Eosinophils Absolute: 0.3 10*3/uL (ref 0.0–0.5)
Eosinophils Relative: 4 %
HCT: 38.6 % (ref 36.0–46.0)
Hemoglobin: 13 g/dL (ref 12.0–15.0)
Immature Granulocytes: 1 %
Lymphocytes Relative: 23 %
Lymphs Abs: 1.9 10*3/uL (ref 0.7–4.0)
MCH: 30.7 pg (ref 26.0–34.0)
MCHC: 33.7 g/dL (ref 30.0–36.0)
MCV: 91.3 fL (ref 80.0–100.0)
Monocytes Absolute: 0.6 10*3/uL (ref 0.1–1.0)
Monocytes Relative: 7 %
Neutro Abs: 5.2 10*3/uL (ref 1.7–7.7)
Neutrophils Relative %: 64 %
Platelets: 156 10*3/uL (ref 150–400)
RBC: 4.23 MIL/uL (ref 3.87–5.11)
RDW: 13.2 % (ref 11.5–15.5)
WBC: 8.1 10*3/uL (ref 4.0–10.5)
nRBC: 0 % (ref 0.0–0.2)

## 2022-11-24 LAB — URINALYSIS, W/ REFLEX TO CULTURE (INFECTION SUSPECTED)
Bilirubin Urine: NEGATIVE
Glucose, UA: NEGATIVE mg/dL
Hgb urine dipstick: NEGATIVE
Ketones, ur: 5 mg/dL — AB
Nitrite: NEGATIVE
Protein, ur: 100 mg/dL — AB
Specific Gravity, Urine: 1.016 (ref 1.005–1.030)
pH: 5 (ref 5.0–8.0)

## 2022-11-24 LAB — SARS CORONAVIRUS 2 BY RT PCR: SARS Coronavirus 2 by RT PCR: NEGATIVE

## 2022-11-24 MED ORDER — TRAZODONE HCL 100 MG PO TABS
100.0000 mg | ORAL_TABLET | Freq: Every day | ORAL | Status: DC
  Administered 2022-11-25 – 2022-11-28 (×5): 100 mg via ORAL
  Filled 2022-11-24 (×5): qty 1

## 2022-11-24 MED ORDER — ACETAMINOPHEN 650 MG RE SUPP: 650.0000 mg | Freq: Four times a day (QID) | RECTAL | Status: DC | PRN

## 2022-11-24 MED ORDER — ALBUTEROL SULFATE (2.5 MG/3ML) 0.083% IN NEBU
2.5000 mg | INHALATION_SOLUTION | Freq: Four times a day (QID) | RESPIRATORY_TRACT | Status: DC | PRN
  Administered 2022-11-27 – 2022-11-28 (×2): 2.5 mg via RESPIRATORY_TRACT
  Filled 2022-11-24 (×2): qty 3

## 2022-11-24 MED ORDER — VITAMIN B-12 1000 MCG PO TABS
2000.0000 ug | ORAL_TABLET | Freq: Every day | ORAL | Status: DC
  Administered 2022-11-25 – 2022-11-29 (×4): 2000 ug via ORAL
  Filled 2022-11-24 (×4): qty 2

## 2022-11-24 MED ORDER — INSULIN ASPART 100 UNIT/ML IJ SOLN
0.0000 [IU] | Freq: Three times a day (TID) | INTRAMUSCULAR | Status: DC
  Administered 2022-11-25 – 2022-11-29 (×5): 2 [IU] via SUBCUTANEOUS
  Filled 2022-11-24 (×5): qty 1

## 2022-11-24 MED ORDER — ONDANSETRON HCL 4 MG PO TABS
4.0000 mg | ORAL_TABLET | Freq: Four times a day (QID) | ORAL | Status: DC | PRN
  Administered 2022-11-25: 4 mg via ORAL
  Filled 2022-11-24: qty 1

## 2022-11-24 MED ORDER — ADULT MULTIVITAMIN W/MINERALS CH
1.0000 | ORAL_TABLET | Freq: Every day | ORAL | Status: DC
  Administered 2022-11-25 – 2022-11-29 (×4): 1 via ORAL
  Filled 2022-11-24 (×4): qty 1

## 2022-11-24 MED ORDER — COLESTIPOL HCL 1 G PO TABS
1.0000 g | ORAL_TABLET | Freq: Two times a day (BID) | ORAL | Status: DC
  Administered 2022-11-25 – 2022-11-29 (×8): 1 g via ORAL
  Filled 2022-11-24 (×9): qty 1

## 2022-11-24 MED ORDER — SODIUM CHLORIDE 0.9 % IV BOLUS
500.0000 mL | Freq: Once | INTRAVENOUS | Status: AC
  Administered 2022-11-24: 500 mL via INTRAVENOUS

## 2022-11-24 MED ORDER — CEPHALEXIN 500 MG PO CAPS: 500.0000 mg | ORAL_CAPSULE | Freq: Two times a day (BID) | ORAL | Status: DC

## 2022-11-24 MED ORDER — FLUTICASONE FUROATE-VILANTEROL 100-25 MCG/ACT IN AEPB
1.0000 | INHALATION_SPRAY | Freq: Every day | RESPIRATORY_TRACT | Status: DC
  Administered 2022-11-26 – 2022-11-29 (×2): 1 via RESPIRATORY_TRACT
  Filled 2022-11-24 (×2): qty 28

## 2022-11-24 MED ORDER — ALBUTEROL SULFATE HFA 108 (90 BASE) MCG/ACT IN AERS: 2.0000 | INHALATION_SPRAY | Freq: Four times a day (QID) | RESPIRATORY_TRACT | Status: DC | PRN

## 2022-11-24 MED ORDER — APIXABAN 2.5 MG PO TABS
2.5000 mg | ORAL_TABLET | Freq: Two times a day (BID) | ORAL | Status: DC
  Administered 2022-11-25 – 2022-11-27 (×5): 2.5 mg via ORAL
  Filled 2022-11-24 (×5): qty 1

## 2022-11-24 MED ORDER — ONDANSETRON HCL 4 MG/2ML IJ SOLN: 4.0000 mg | Freq: Four times a day (QID) | INTRAMUSCULAR | Status: DC | PRN

## 2022-11-24 MED ORDER — SODIUM CHLORIDE 0.9 % IV SOLN
1.0000 g | INTRAVENOUS | Status: DC
  Administered 2022-11-25 – 2022-11-27 (×3): 1 g via INTRAVENOUS
  Filled 2022-11-24 (×2): qty 10
  Filled 2022-11-24: qty 1

## 2022-11-24 MED ORDER — ACETAMINOPHEN 325 MG PO TABS
650.0000 mg | ORAL_TABLET | Freq: Four times a day (QID) | ORAL | Status: DC | PRN
  Administered 2022-11-27 – 2022-11-28 (×4): 650 mg via ORAL
  Filled 2022-11-24 (×4): qty 2

## 2022-11-24 MED ORDER — SODIUM CHLORIDE 0.9 % IV SOLN: INTRAVENOUS | Status: DC

## 2022-11-24 MED ORDER — MAGNESIUM OXIDE -MG SUPPLEMENT 400 (240 MG) MG PO TABS
400.0000 mg | ORAL_TABLET | Freq: Every day | ORAL | Status: DC
  Administered 2022-11-25 – 2022-11-29 (×4): 400 mg via ORAL
  Filled 2022-11-24 (×4): qty 1

## 2022-11-24 MED ORDER — ONDANSETRON HCL 4 MG/2ML IJ SOLN
4.0000 mg | Freq: Once | INTRAMUSCULAR | Status: AC
  Administered 2022-11-24: 4 mg via INTRAVENOUS
  Filled 2022-11-24: qty 2

## 2022-11-24 MED ORDER — VITAMIN C 500 MG PO TABS
250.0000 mg | ORAL_TABLET | Freq: Every day | ORAL | Status: DC
  Administered 2022-11-25 – 2022-11-29 (×4): 250 mg via ORAL
  Filled 2022-11-24 (×4): qty 1

## 2022-11-24 NOTE — Assessment & Plan Note (Signed)
Patient has type 2 diabetes mellitus with complications of stage III chronic kidney disease Hold glipizide Glycemic control with sliding scale insulin Monitor renal function closely

## 2022-11-24 NOTE — Assessment & Plan Note (Signed)
Hold diltiazem and metoprolol due to bradycardia Will place patient on IV hydralazine as needed for systolic blood pressure greater than 160

## 2022-11-24 NOTE — Assessment & Plan Note (Signed)
Patient presented to the ER for evaluation of dizziness and lightheadedness and was found to have worsening of her renal function from baseline. Serum creatinine on admission is 2.4 compared to baseline of 1.60 Gentle IV fluid hydration Hold furosemide Avoid nephrotoxic agents Repeat renal parameters in a.m.

## 2022-11-24 NOTE — ED Provider Notes (Signed)
[facility] Provider Note    Event Date/Time   First MD Initiated Contact with Patient 11/24/22 2127     (approximate)   History   Chief Complaint: Weakness   HPI  Leslie Parker is a 89 y.o. female with a history of diabetes hypertension atrial fibrillation on Eliquis who comes ED complaining of fatigue tonight.  She notes that her cardiologist increased her dose of Cardizem from 240 to 360 mg and she took the first dose of the higher dosage today this morning.  Denies fever chills chest pain shortness of breath dizziness syncope or fall.  No dysuria.  Otherwise compliant with her medications.  Patient also notes nausea.  Reviewed outside records from cardiology noting that patient has reported generalized weakness off-and-on over a long period of time.  She had pacemaker insertion for sick sinus syndrome about 2 weeks ago.     Physical Exam   Triage Vital Signs: ED Triage Vitals  Enc Vitals Group     BP 11/24/22 2118 (!) 170/86     Pulse Rate 11/24/22 2118 (!) 51     Resp 11/24/22 2118 17     Temp 11/24/22 2118 97.9 F (36.6 C)     Temp Source 11/24/22 2118 Oral     SpO2 11/24/22 2118 94 %     Weight 11/24/22 2120 196 lb 3.4 oz (89 kg)     Height 11/24/22 2120 5\' 9"  (1.753 m)     Head Circumference --      Peak Flow --      Pain Score --      Pain Loc --      Pain Edu? --      Excl. in GC? --     Most recent vital signs: Vitals:   11/24/22 2200 11/24/22 2230  BP: (!) 140/76 (!) 121/104  Pulse: (!) 59 (!) 58  Resp: 15 18  Temp:    SpO2: 96% 98%    General: Awake, no distress.  CV:  Good peripheral perfusion.  Regular rate and rhythm.  Normal distal pulses. Resp:  Normal effort.  Clear to auscultation bilaterally Abd:  No distention.  Soft nontender Other:  Moist oral mucosa.  Pacemaker site is nontender, noninflamed and healed well.   ED Results / Procedures / Treatments   Labs (all labs ordered are listed, but only abnormal  results are displayed) Labs Reviewed  BASIC METABOLIC PANEL - Abnormal; Notable for the following components:      Result Value   Sodium 134 (*)    CO2 21 (*)    Glucose, Bld 217 (*)    BUN 57 (*)    Creatinine, Ser 2.44 (*)    Calcium 8.2 (*)    GFR, Estimated 19 (*)    All other components within normal limits  URINALYSIS, W/ REFLEX TO CULTURE (INFECTION SUSPECTED) - Abnormal; Notable for the following components:   Color, Urine AMBER (*)    APPearance CLOUDY (*)    Ketones, ur 5 (*)    Protein, ur 100 (*)    Leukocytes,Ua MODERATE (*)    Bacteria, UA RARE (*)    All other components within normal limits  SARS CORONAVIRUS 2 BY RT PCR  URINE CULTURE  CBC WITH DIFFERENTIAL/PLATELET     EKG Interpreted by me Paced rhythm, rate of 60.  Left axis, left bundle branch block.  No acute ischemic changes.   RADIOLOGY Chest x-ray interpreted by me, appears normal.  Radiology  report reviewed.   PROCEDURES:  Procedures   MEDICATIONS ORDERED IN ED: Medications  sodium chloride 0.9 % bolus 500 mL (0 mLs Intravenous Stopped 11/24/22 2259)  ondansetron (ZOFRAN) injection 4 mg (4 mg Intravenous Given 11/24/22 2134)     IMPRESSION / MDM / ASSESSMENT AND PLAN / ED COURSE  I reviewed the triage vital signs and the nursing notes.  DDx: Dehydration, AKI, electrolyte abnormality, UTI, COVID, adverse drug reaction to calcium channel blocker  Patient's presentation is most consistent with acute presentation with potential threat to life or bodily function.  Patient presents with generalized weakness and nausea today after taking an increased dose of Cardizem.  Vitals are unremarkable.  Will give IV fluids and Zofran and check labs and chest x-ray.      ----------------------------------------- 11:07 PM on 11/24/2022 -----------------------------------------  COVID-negative, CBC normal.  Chemistry reveals AKI on CKD.  Patient still lightheaded with standing.  With recent increase  in Cardizem and initiation of metoprolol, may be excessively AV nodal blockaded.  Case discussed with hospitalist for further management.       FINAL CLINICAL IMPRESSION(S) / ED DIAGNOSES   Final diagnoses:  AKI (acute kidney injury) (HCC)     Rx / DC Orders   ED Discharge Orders     None        Note:  This document was prepared using Dragon voice recognition software and may include unintentional dictation errors.   Sharman Cheek, MD 11/24/22 2308

## 2022-11-24 NOTE — Assessment & Plan Note (Signed)
Hold Cardizem, metoprolol and amiodarone due to bradycardia Continue Eliquis as primary prophylaxis for an acute stroke

## 2022-11-24 NOTE — ED Triage Notes (Signed)
Patient C/O weakness and nausea that started tonight. Of note, patient had pacemaker placed on 04/25, and her Dr. Increased her cardizem dose, and she took that first dose this morning.

## 2022-11-24 NOTE — H&P (Signed)
History and Physical    Patient: Leslie Parker:[id number] DOB: January 06, 1933 DOA: 11/24/2022 DOS: the patient was seen and examined on 11/24/2022 PCP: [name], MD  Patient coming from: Home  Chief Complaint:  Chief Complaint  Patient presents with   Weakness   HPI: Leslie Parker is a 89 y.o. female with medical history significant for sick sinus syndrome status post recent pacemaker insertion, hypertension, history of atrial fibrillation, diabetes mellitus who presents to the emergency room for evaluation of sudden onset dizziness.   Patient states that she was in her usual state of health until the day of admission at about 4 PM when she got up from the couch to close the blinds and suddenly felt very dizzy and lightheaded.  She quickly sat back down in the couch.  She notes that every time she tried to get up she felt dizzy.  Symptoms are associated with nausea but no vomiting.  She denied having any tinnitus or headache.  Due to the persistence of her symptoms she presented to the ER for further evaluation. She denies having any chest pain, no shortness of breath, no fever, no chills, no cough, no abdominal pain, no changes in her bowel habits, no blurred vision or focal deficit. Vital signs in the ER showed heart rate in the 50s Abnormal labs include a serum creatinine of 2.44 compared to baseline of 1.60.  She also has pyuria Chest x-ray reviewed by me shows no evidence of acute cardiopulmonary disease Of note patient states that her dose of Cardizem was recently increased from 240 mg to 360 and she was recently started on metoprolol She received a 500 cc fluid bolus and will be admitted to the hospital for further evaluation.  Review of Systems: As mentioned in the history of present illness. All other systems reviewed and are negative. Past Medical History:  Diagnosis Date   Asthma    Atrial fibrillation with RVR (HCC)    Cancer (HCC)    Basal Cell   Diabetes mellitus  without complication (HCC)    Heart murmur    Hypertension    Impetigo    Osteoarthritis    Osteopenia    Vomiting    can not due to surgery   Wears dentures    full upper and lower   Past Surgical History:  Procedure Laterality Date   ABDOMINAL HYSTERECTOMY     BACK SURGERY     BLADDER SURGERY     mesh   CARPAL TUNNEL RELEASE Bilateral    CATARACT EXTRACTION Right 2017   CATARACT EXTRACTION W/PHACO Left 02/06/2016   Procedure: CATARACT EXTRACTION PHACO AND INTRAOCULAR LENS PLACEMENT (IOC) left eye;  Surgeon: Sherald Hess, MD;  Location: [facility];  Service: Ophthalmology;  Laterality: Left;  DIABETIC LEFT Cannot arrive before 9:30   CERVICAL DISC SURGERY     CHOLECYSTECTOMY     EYE SURGERY Bilateral    Cataract Extraction with IOL   FEMUR IM NAIL Left 02/04/2015   Procedure: INTRAMEDULLARY (IM) NAIL FEMORAL;  Surgeon: Juanell Fairly, MD;  Location: [facility];  Service: Orthopedics;  Laterality: Left;   HARDWARE REMOVAL Left 01/17/2017   Procedure: HARDWARE REMOVAL;  Surgeon: Juanell Fairly, MD;  Location: [facility];  Service: Orthopedics;  Laterality: Left;   HERNIA REPAIR  2014   esophageal and gastric mesh. patient unable to throw up d/t mesh   HIP ARTHROPLASTY Left 01/26/2017   Procedure: ARTHROPLASTY BIPOLAR HIP (HEMIARTHROPLASTY) removal hardware left hip;  Surgeon:  Deeann Saint, MD;  Location: [facility];  Service: Orthopedics;  Laterality: Left;   JOINT REPLACEMENT Bilateral    knees   PACEMAKER IMPLANT N/A 11/15/2022   Procedure: PACEMAKER IMPLANT;  Surgeon: Marcina Millard, MD;  Location: [facility];  Service: Cardiovascular;  Laterality: N/A;   REPLACEMENT TOTAL KNEE BILATERAL Bilateral [id number]   SHOULDER ARTHROSCOPY WITH OPEN ROTATOR CUFF REPAIR AND DISTAL CLAVICLE ACROMINECTOMY Left 10/25/2016   Procedure: SHOULDER ARTHROSCOPY WITH OPEN ROTATOR CUFF REPAIR AND DISTAL CLAVICLE ACROMINECTOMY;  Surgeon: Juanell Fairly, MD;  Location:  [facility];  Service: Orthopedics;  Laterality: Left;   THYROID SURGERY     goiter removed   TOTAL HIP REVISION Left 12/02/2017   Procedure: TOTAL HIP REVISION;  Surgeon: Lyndle Herrlich, MD;  Location: [facility];  Service: Orthopedics;  Laterality: Left;   TOTAL SHOULDER REPLACEMENT Right 2012   Social History:  reports that she has never smoked. She has never used smokeless tobacco. She reports that she does not drink alcohol and does not use drugs.  Allergies  Allergen Reactions   Baclofen Other (See Comments)    Elevated Cr    Family History  Problem Relation Age of Onset   Heart failure Mother    Hypertension Mother    Emphysema Father     Prior to Admission medications   Medication Sig Start Date End Date Taking? Authorizing Provider  albuterol (VENTOLIN HFA) 108 (90 Base) MCG/ACT inhaler Inhale 2 puffs into the lungs every 6 (six) hours as needed. 08/04/19  Yes [provider]  amiodarone (PACERONE) 200 MG tablet Take 200 mg by mouth daily.   Yes [provider]  apixaban (ELIQUIS) 2.5 MG TABS tablet Take 1 tablet (2.5 mg total) by mouth 2 (two) times daily. 06/07/22  Yes Sreenath, Sudheer B, MD  ascorbic acid (VITAMIN C) 250 MG CHEW Chew 250 mg by mouth daily.   Yes [provider]  cephALEXin (KEFLEX) 500 MG capsule Take 1 capsule (500 mg total) by mouth 2 (two) times daily for 10 days. 11/16/22 11/26/22 Yes Tang, Cheryln Manly, PA-C  colestipol (COLESTID) 1 g tablet Take 1 tablet by mouth 2 (two) times daily. 06/01/22  Yes [provider]  Cyanocobalamin (B-12) 2500 MCG TABS Take 2,500 mcg by mouth daily.   Yes [provider]  diltiazem (CARDIZEM CD) 240 MG 24 hr capsule Take 1 capsule (240 mg total) by mouth daily. 06/08/22 01/04/23 Yes Creig Hines, NP  furosemide (LASIX) 20 MG tablet Take 20 mg by mouth daily. 03/06/22  Yes [provider]  glipiZIDE (GLUCOTROL XL) 2.5 MG 24 hr tablet Take 2.5 mg by mouth daily  with breakfast. 03/27/22  Yes [provider]  ipratropium-albuterol (DUONEB) 0.5-2.5 (3) MG/3ML SOLN Inhale 3 mLs into the lungs every 6 (six) hours as needed. 06/01/22  Yes [provider]  Magnesium Oxide 500 MG CAPS Take 500 mg by mouth daily.   Yes [provider]  metoprolol succinate (TOPROL-XL) 25 MG 24 hr tablet Take 1 tablet (25 mg total) by mouth daily. 11/16/22  Yes Tang, Cheryln Manly, PA-C  Multiple Vitamin (MULTIVITAMIN WITH MINERALS) TABS tablet Take 1 tablet by mouth daily. One-A-Day Women's Vitamin   Yes [provider]  traZODone (DESYREL) 100 MG tablet Take 100 mg by mouth at bedtime.   Yes [provider]  TRELEGY ELLIPTA 100-62.5-25 MCG/ACT AEPB Inhale 1 puff into the lungs daily. 08/03/22  Yes [provider]    Physical Exam: Vitals:  11/24/22 2200 11/24/22 2230 11/24/22 2330 11/24/22 2357  BP: (!) 140/76 (!) 121/104 132/72   Pulse: (!) 59 (!) 58 60 61  Resp: 15 18 12 12   Temp:      TempSrc:      SpO2: 96% 98% 94% 91%  Weight:      Height:       Physical Exam Vitals and nursing note reviewed.  Constitutional:      Appearance: Normal appearance.  HENT:     Head: Normocephalic and atraumatic.     Nose: Nose normal.     Mouth/Throat:     Mouth: Mucous membranes are moist.  Eyes:     Conjunctiva/sclera: Conjunctivae normal.  Cardiovascular:     Rate and Rhythm: Bradycardia present.  Pulmonary:     Effort: Pulmonary effort is normal.     Breath sounds: Normal breath sounds.  Abdominal:     General: Abdomen is flat. Bowel sounds are normal.     Palpations: Abdomen is soft.  Musculoskeletal:        General: Normal range of motion.     Cervical back: Normal range of motion and neck supple.  Skin:    General: Skin is warm and dry.  Neurological:     Mental Status: She is alert.     Motor: Weakness present.  Psychiatric:        Mood and Affect: Mood normal.        Behavior: Behavior normal.     Data  Reviewed: Relevant notes from primary care and specialist visits, past discharge summaries as available in EHR, including Care Everywhere. Prior diagnostic testing as pertinent to current admission diagnoses Updated medications and problem lists for reconciliation ED course, including vitals, labs, imaging, treatment and response to treatment Triage notes, nursing and pharmacy notes and ED provider's notes Notable results as noted in HPI Labs reviewed.  Sodium 134, potassium 4.4, chloride 102, bicarb 21, glucose 217, BUN 57, creatinine 2.4 compared to baseline of 1.60, calcium 8.2, white count 8.1, hemoglobin 13, hematocrit 38.6, platelet count 156 Twelve-lead EKG reviewed by me shows a junctional rhythm with LVH. There are no new results to review at this time.  Assessment and Plan: * AKI (acute kidney injury) [facility]) Patient presented to the ER for evaluation of dizziness and lightheadedness and was found to have worsening of her renal function from baseline. Serum creatinine on admission is 2.4 compared to baseline of 1.60 Gentle IV fluid hydration Hold furosemide Avoid nephrotoxic agents Repeat renal parameters in a.m.  Bradycardia Most likely medication induced Hold amiodarone, metoprolol and Cardizem Consult cardiology  Paroxysmal atrial fibrillation (HCC) Hold Cardizem, metoprolol and amiodarone due to bradycardia Continue Eliquis as primary prophylaxis for an acute stroke  UTI (urinary tract infection) Patient noted to have pyuria Prior urine culture yielded E. Coli Treat patient empirically with Rocephin 1 g IV daily Follow up results of repeat urine culture  CKD stage 3 due to type 2 diabetes mellitus (HCC) Patient has type 2 diabetes mellitus with complications of stage III chronic kidney disease Hold glipizide Glycemic control with sliding scale insulin Monitor renal function closely  Sick sinus syndrome (HCC) Status post recent pacemaker  insertion Stable  Hypertension Hold diltiazem and metoprolol due to bradycardia Will place patient on IV hydralazine as needed for systolic blood pressure greater than 160      Advance Care Planning:   Code Status: Full Code   Consults: Cardiology  Family Communication: Greater than 50% of time was  spent discussing patient's condition and plan of care with her at the bedside.  All questions and concerns have been addressed.  She verbalizes understanding and agrees with the plan.  She lists her granddaughter as her healthcare power of attorney.  Severity of Illness: The appropriate patient status for this patient is INPATIENT. Inpatient status is judged to be reasonable and necessary in order to provide the required intensity of service to ensure the patient's safety. The patient's presenting symptoms, physical exam findings, and initial radiographic and laboratory data in the context of their chronic comorbidities is felt to place them at high risk for further clinical deterioration. Furthermore, it is not anticipated that the patient will be medically stable for discharge from the hospital within 2 midnights of admission.   * I certify that at the point of admission it is my clinical judgment that the patient will require inpatient hospital care spanning beyond 2 midnights from the point of admission due to high intensity of service, high risk for further deterioration and high frequency of surveillance required.*  Author: Lucile Shutters, MD 11/24/2022 11:57 PM  For on call review [email or web address].

## 2022-11-24 NOTE — Assessment & Plan Note (Signed)
Status post recent pacemaker insertion ?Stable ?

## 2022-11-24 NOTE — Assessment & Plan Note (Signed)
Patient noted to have pyuria Prior urine culture yielded E. Coli Treat patient empirically with Rocephin 1 g IV daily Follow up results of repeat urine culture

## 2022-11-24 NOTE — Assessment & Plan Note (Signed)
Most likely medication induced Hold amiodarone, metoprolol and Cardizem Consult cardiology

## 2022-11-25 ENCOUNTER — Encounter: Payer: Self-pay | Admitting: Internal Medicine

## 2022-11-25 ENCOUNTER — Other Ambulatory Visit: Payer: Self-pay

## 2022-11-25 DIAGNOSIS — I480 Paroxysmal atrial fibrillation: Secondary | ICD-10-CM | POA: Diagnosis not present

## 2022-11-25 DIAGNOSIS — R55 Syncope and collapse: Secondary | ICD-10-CM

## 2022-11-25 DIAGNOSIS — R001 Bradycardia, unspecified: Secondary | ICD-10-CM

## 2022-11-25 DIAGNOSIS — N179 Acute kidney failure, unspecified: Secondary | ICD-10-CM | POA: Diagnosis not present

## 2022-11-25 LAB — CBG MONITORING, ED
Glucose-Capillary: 129 mg/dL — ABNORMAL HIGH (ref 70–99)
Glucose-Capillary: 103 mg/dL — ABNORMAL HIGH (ref 70–99)

## 2022-11-25 LAB — BASIC METABOLIC PANEL
Anion gap: 6 (ref 5–15)
BUN: 47 mg/dL — ABNORMAL HIGH (ref 8–23)
CO2: 25 mmol/L (ref 22–32)
Calcium: 8.4 mg/dL — ABNORMAL LOW (ref 8.9–10.3)
Chloride: 106 mmol/L (ref 98–111)
Creatinine, Ser: 2.13 mg/dL — ABNORMAL HIGH (ref 0.44–1.00)
GFR, Estimated: 22 mL/min — ABNORMAL LOW (ref >60)
Glucose, Bld: 119 mg/dL — ABNORMAL HIGH (ref 70–99)
Potassium: 3.8 mmol/L (ref 3.5–5.1)
Sodium: 137 mmol/L (ref 135–145)

## 2022-11-25 LAB — CBC
HCT: 37 % (ref 36.0–46.0)
Hemoglobin: 12.3 g/dL (ref 12.0–15.0)
MCH: 30.2 pg (ref 26.0–34.0)
MCHC: 33.2 g/dL (ref 30.0–36.0)
MCV: 90.9 fL (ref 80.0–100.0)
Platelets: 136 10*3/uL — ABNORMAL LOW (ref 150–400)
RBC: 4.07 MIL/uL (ref 3.87–5.11)
RDW: 13.1 % (ref 11.5–15.5)
WBC: 6.8 10*3/uL (ref 4.0–10.5)
nRBC: 0 % (ref 0.0–0.2)

## 2022-11-25 LAB — TROPONIN I (HIGH SENSITIVITY): Troponin I (High Sensitivity): 24 ng/L — ABNORMAL HIGH (ref <18)

## 2022-11-25 LAB — GLUCOSE, CAPILLARY
Glucose-Capillary: 127 mg/dL — ABNORMAL HIGH (ref 70–99)
Glucose-Capillary: 76 mg/dL (ref 70–99)

## 2022-11-25 MED ORDER — HYDRALAZINE HCL 25 MG PO TABS
25.0000 mg | ORAL_TABLET | Freq: Three times a day (TID) | ORAL | Status: DC
  Administered 2022-11-25 – 2022-11-29 (×11): 25 mg via ORAL
  Filled 2022-11-25 (×11): qty 1

## 2022-11-25 MED ORDER — AMIODARONE HCL 200 MG PO TABS
200.0000 mg | ORAL_TABLET | Freq: Every day | ORAL | Status: DC
  Administered 2022-11-25 – 2022-11-29 (×4): 200 mg via ORAL
  Filled 2022-11-25 (×4): qty 1

## 2022-11-25 MED ORDER — SODIUM CHLORIDE 0.9 % IV SOLN: INTRAVENOUS | Status: DC

## 2022-11-25 MED ORDER — HYDRALAZINE HCL 20 MG/ML IJ SOLN
5.0000 mg | Freq: Four times a day (QID) | INTRAMUSCULAR | Status: DC | PRN
  Administered 2022-11-25 – 2022-11-29 (×7): 5 mg via INTRAVENOUS
  Filled 2022-11-25 (×7): qty 1

## 2022-11-25 NOTE — ED Notes (Signed)
Assumed care from Kara,RN. Pt resting comfortably in bed at this time. Pt denies any current needs or questions. Call light with in reach.   

## 2022-11-25 NOTE — ED Notes (Signed)
Pt daughter calling to speak with mother. RN advised daughter there are no phones in the rooms in the ER but will have her call on the portable phone. RN went to give phone to pt to call, pt granddaughter at bedside and states its fine ill give her a call. Pt and granddaughter denied any additional concerns or needs at this time.

## 2022-11-25 NOTE — Progress Notes (Signed)
PROGRESS NOTE    Leslie Parker  ZOX:[id number] DOB: October 13, 1932 DOA: 11/24/2022 PCP: [name], MD   Assessment & Plan:   Principal Problem:   AKI (acute kidney injury) (HCC) Active Problems:   Bradycardia   Hypertension   Sick sinus syndrome (HCC)   CKD stage 3 due to type 2 diabetes mellitus (HCC)   UTI (urinary tract infection)   Paroxysmal atrial fibrillation (HCC)  Assessment and Plan: AKI on CKDIIIb: baseline Cr 1.6. Cr is labile. Avoid nephrotoxic meds. Continue on IVFs   Bradycardia: likely medication induced. Continue on tele. Continue to hold metoprolol, diltiazem & amio. Cardio consulted    PAF: continue on eliquis. Holding metoprolol, diltiazem, amio secondary to bradycardia    UTI: urine cx is pending. Continue on IV rocephin   DM2: likely well controlled. Continue on SSI w/ accuchecks   Sick sinus syndrome: s/p recent pacemaker insertion.   HTN: holding metoprolol, diltiazem secondary to bradycardia. IV hydralazine prn      DVT prophylaxis: eliquis  Code Status:  full  Family Communication:  Disposition Plan: depends on PT/OT recs   Level of care: Telemetry Cardiac  Status is: Inpatient Remains inpatient appropriate because: severity of illness    Consultants:  Cardio   Procedures:   Antimicrobials: rocephin   Subjective: Pt c/o malaise  Objective: Vitals:   11/25/22 0302 11/25/22 0730 11/25/22 1130 11/25/22 1458  BP: (!) 177/71 (!) 142/63 (!) 139/95 (!) 187/86  Pulse: 62 67 71 71  Resp: 15 16 16 16   Temp: 98.1 F (36.7 C) 98 F (36.7 C) 98.2 F (36.8 C) 98 F (36.7 C)  TempSrc: Oral     SpO2: 93% 92% 94% 96%  Weight:      Height:        Intake/Output Summary (Last 24 hours) at 11/25/2022 1504 Last data filed at 11/25/2022 0229 Gross per 24 hour  Intake 40.92 ml  Output --  Net 40.92 ml   Filed Weights   11/24/22 2120  Weight: 89 kg    Examination:  General exam: Appears calm and comfortable  Respiratory system:  Clear to auscultation. Respiratory effort normal. Cardiovascular system: S1 & S2+. No rubs, gallops or clicks.  Gastrointestinal system: Abdomen is nondistended, soft and nontender. Normal bowel sounds heard. Central nervous system: Alert and oriented. Moves all extremities. Psychiatry: Judgement and insight appear normal. Mood & affect appropriate.     Data Reviewed: I have personally reviewed following labs and imaging studies  CBC: Recent Labs  Lab 11/24/22 2124 11/25/22 0743  WBC 8.1 6.8  NEUTROABS 5.2  --   HGB 13.0 12.3  HCT 38.6 37.0  MCV 91.3 90.9  PLT 156 136*   Basic Metabolic Panel: Recent Labs  Lab 11/24/22 2124 11/25/22 0743  NA 134* 137  K 4.4 3.8  CL 102 106  CO2 21* 25  GLUCOSE 217* 119*  BUN 57* 47*  CREATININE 2.44* 2.13*  CALCIUM 8.2* 8.4*   GFR: Estimated Creatinine Clearance: 22.1 mL/min (A) (by C-G formula based on SCr of 2.13 mg/dL (H)). Liver Function Tests: No results for input(s): "AST", "ALT", "ALKPHOS", "BILITOT", "PROT", "ALBUMIN" in the last 168 hours. No results for input(s): "LIPASE", "AMYLASE" in the last 168 hours. No results for input(s): "AMMONIA" in the last 168 hours. Coagulation Profile: No results for input(s): "INR", "PROTIME" in the last 168 hours. Cardiac Enzymes: No results for input(s): "CKTOTAL", "CKMB", "CKMBINDEX", "TROPONINI" in the last 168 hours. BNP (last 3 results) No results for  input(s): "PROBNP" in the last 8760 hours. HbA1C: No results for input(s): "HGBA1C" in the last 72 hours. CBG: Recent Labs  Lab 11/25/22 0745 11/25/22 1132  GLUCAP 129* 103*   Lipid Profile: No results for input(s): "CHOL", "HDL", "LDLCALC", "TRIG", "CHOLHDL", "LDLDIRECT" in the last 72 hours. Thyroid Function Tests: No results for input(s): "TSH", "T4TOTAL", "FREET4", "T3FREE", "THYROIDAB" in the last 72 hours. Anemia Panel: No results for input(s): "VITAMINB12", "FOLATE", "FERRITIN", "TIBC", "IRON", "RETICCTPCT" in the last  72 hours. Sepsis Labs: No results for input(s): "PROCALCITON", "LATICACIDVEN" in the last 168 hours.  Recent Results (from the past 240 hour(s))  SARS Coronavirus 2 by RT PCR (hospital order, performed in [facility] hospital lab) *cepheid single result test* Anterior Nasal Swab     Status: None   Collection Time: 11/24/22  9:24 PM   Specimen: Anterior Nasal Swab  Result Value Ref Range Status   SARS Coronavirus 2 by RT PCR NEGATIVE NEGATIVE Final    Comment: (NOTE) SARS-CoV-2 target nucleic acids are NOT DETECTED.  The SARS-CoV-2 RNA is generally detectable in upper and lower respiratory specimens during the acute phase of infection. The lowest concentration of SARS-CoV-2 viral copies this assay can detect is 250 copies / mL. A negative result does not preclude SARS-CoV-2 infection and should not be used as the sole basis for treatment or other patient management decisions.  A negative result may occur with improper specimen collection / handling, submission of specimen other than nasopharyngeal swab, presence of viral mutation(s) within the areas targeted by this assay, and inadequate number of viral copies (<250 copies / mL). A negative result must be combined with clinical observations, patient history, and epidemiological information.  Fact Sheet for Patients:   RoadLapTop.co.za  Fact Sheet for Healthcare Providers: http://kim-miller.com/  This test is not yet approved or  cleared by the [place] FDA and has been authorized for detection and/or diagnosis of SARS-CoV-2 by FDA under an Emergency Use Authorization (EUA).  This EUA will remain in effect (meaning this test can be used) for the duration of the COVID-19 declaration under Section 564(b)(1) of the Act, 21 U.S.C. section 360bbb-3(b)(1), unless the authorization is terminated or revoked sooner.  Performed at [facility], [address], [place], [place]  [address]          Radiology Studies: DG Chest Portable 1 View  Result Date: 11/24/2022 CLINICAL DATA:  Weakness and nausea EXAM: PORTABLE CHEST 1 VIEW COMPARISON:  11/15/2022 FINDINGS: Stable cardiomegaly. Aortic atherosclerotic calcification. Left chest wall pacemaker. Bibasilar atelectasis/scarring. No focal pneumonia, definite pleural effusion, or pneumothorax. Reverse right TSA. IMPRESSION: No active disease.  Cardiomegaly. Electronically Signed   By: Minerva Fester M.D.   On: 11/24/2022 21:48        Scheduled Meds:  apixaban  2.5 mg Oral BID   ascorbic acid  250 mg Oral Daily   colestipol  1 g Oral BID   cyanocobalamin  2,000 mcg Oral Daily   fluticasone furoate-vilanterol  1 puff Inhalation Daily   insulin aspart  0-15 Units Subcutaneous TID WC   magnesium oxide  400 mg Oral Daily   multivitamin with minerals  1 tablet Oral Daily   traZODone  100 mg Oral QHS   Continuous Infusions:  sodium chloride 75 mL/hr at 11/25/22 0858   cefTRIAXone (ROCEPHIN)  IV Stopped (11/25/22 0203)     LOS: 1 day    Time spent: 35 mins     Charise Killian, MD [facility] Pager  [phone]  If 7PM-7AM, please contact night-coverage [email or web address] 11/25/2022, 3:04 PM

## 2022-11-25 NOTE — Consult Note (Addendum)
Leslie Parker is a 89 y.o. female  [id number]  Primary Cardiologist: Dr. Juliann Pares Reason for Consultation: Dizziness/bradycardia  HPI: 89 year old white female who had permanent pacemaker implantation for sick sinus syndrome on 11/15/2022 presented with lightheadedness dizziness and found to be having intermittent bradycardia with apparently pacemaker not sensing properly.  I was asked to evaluate the patient because of bradycardia.   Review of Systems: No chest pain or palpitations or shortness of breath   Past Medical History:  Diagnosis Date   Asthma    Atrial fibrillation with RVR (HCC)    Cancer (HCC)    Basal Cell   Diabetes mellitus without complication (HCC)    Heart murmur    Hypertension    Impetigo    Osteoarthritis    Osteopenia    Vomiting    can not due to surgery   Wears dentures    full upper and lower    (Not in a hospital admission)     apixaban  2.5 mg Oral BID   ascorbic acid  250 mg Oral Daily   colestipol  1 g Oral BID   cyanocobalamin  2,000 mcg Oral Daily   fluticasone furoate-vilanterol  1 puff Inhalation Daily   insulin aspart  0-15 Units Subcutaneous TID WC   magnesium oxide  400 mg Oral Daily   multivitamin with minerals  1 tablet Oral Daily   traZODone  100 mg Oral QHS    Infusions:  sodium chloride 75 mL/hr at 11/25/22 0858   cefTRIAXone (ROCEPHIN)  IV Stopped (11/25/22 0203)    Allergies  Allergen Reactions   Baclofen Other (See Comments)    Elevated Cr    Social History   Socioeconomic History   Marital status: Divorced    Spouse name: Not on file   Number of children: Not on file   Years of education: Not on file   Highest education level: Not on file  Occupational History   Not on file  Tobacco Use   Smoking status: Never   Smokeless tobacco: Never  Vaping Use   Vaping Use: Never used  Substance and Sexual Activity   Alcohol use: No    Alcohol/week: 0.0 standard drinks of alcohol   Drug use: No   Sexual  activity: Never  Other Topics Concern   Not on file  Social History Narrative   Not on file   Social Determinants of Health   Financial Resource Strain: Not on file  Food Insecurity: No Food Insecurity (11/15/2022)   Hunger Vital Sign    Worried About Running Out of Food in the Last Year: Never true    Ran Out of Food in the Last Year: Never true  Transportation Needs: No Transportation Needs (11/15/2022)   PRAPARE - Administrator, Civil Service (Medical): No    Lack of Transportation (Non-Medical): No  Physical Activity: Not on file  Stress: Not on file  Social Connections: Not on file  Intimate Partner Violence: Not At Risk (11/15/2022)   Humiliation, Afraid, Rape, and Kick questionnaire    Fear of Current or Ex-Partner: No    Emotionally Abused: No    Physically Abused: No    Sexually Abused: No    Family History  Problem Relation Age of Onset   Heart failure Mother    Hypertension Mother    Emphysema Father     PHYSICAL EXAM: Vitals:   11/25/22 0302 11/25/22 0730  BP: (!) 177/71 (!) 142/63  Pulse:  62 67  Resp: 15 16  Temp: 98.1 F (36.7 C) 98 F (36.7 C)  SpO2: 93% 92%     Intake/Output Summary (Last 24 hours) at 11/25/2022 0906 Last data filed at 11/25/2022 0229 Gross per 24 hour  Intake 40.92 ml  Output --  Net 40.92 ml    General:  Well appearing. No respiratory difficulty HEENT: normal Neck: supple. no JVD. Carotids 2+ bilat; no bruits. No lymphadenopathy or thryomegaly appreciated. Cor: PMI nondisplaced. Regular rate & rhythm. No rubs, gallops or murmurs. Lungs: clear Abdomen: soft, nontender, nondistended. No hepatosplenomegaly. No bruits or masses. Good bowel sounds. Extremities: no cyanosis, clubbing, rash, edema Neuro: alert & oriented x 3, cranial nerves grossly intact. moves all 4 extremities w/o difficulty. Affect pleasant.  ECG: AV sequential pacemaker rhythm with 60 bpm  Results for orders placed or performed during the  hospital encounter of 11/24/22 (from the past 24 hour(s))  Basic metabolic panel     Status: Abnormal   Collection Time: 11/24/22  9:24 PM  Result Value Ref Range   Sodium 134 (L) 135 - 145 mmol/L   Potassium 4.4 3.5 - 5.1 mmol/L   Chloride 102 98 - 111 mmol/L   CO2 21 (L) 22 - 32 mmol/L   Glucose, Bld 217 (H) 70 - 99 mg/dL   BUN 57 (H) 8 - 23 mg/dL   Creatinine, Ser 1.61 (H) 0.44 - 1.00 mg/dL   Calcium 8.2 (L) 8.9 - 10.3 mg/dL   GFR, Estimated 19 (L) >60 mL/min   Anion gap 11 5 - 15  CBC with Differential     Status: None   Collection Time: 11/24/22  9:24 PM  Result Value Ref Range   WBC 8.1 4.0 - 10.5 K/uL   RBC 4.23 3.87 - 5.11 MIL/uL   Hemoglobin 13.0 12.0 - 15.0 g/dL   HCT 09.6 04.5 - 40.9 %   MCV 91.3 80.0 - 100.0 fL   MCH 30.7 26.0 - 34.0 pg   MCHC 33.7 30.0 - 36.0 g/dL   RDW 81.1 91.4 - 78.2 %   Platelets 156 150 - 400 K/uL   nRBC 0.0 0.0 - 0.2 %   Neutrophils Relative % 64 %   Neutro Abs 5.2 1.7 - 7.7 K/uL   Lymphocytes Relative 23 %   Lymphs Abs 1.9 0.7 - 4.0 K/uL   Monocytes Relative 7 %   Monocytes Absolute 0.6 0.1 - 1.0 K/uL   Eosinophils Relative 4 %   Eosinophils Absolute 0.3 0.0 - 0.5 K/uL   Basophils Relative 1 %   Basophils Absolute 0.1 0.0 - 0.1 K/uL   Immature Granulocytes 1 %   Abs Immature Granulocytes 0.04 0.00 - 0.07 K/uL  SARS Coronavirus 2 by RT PCR (hospital order, performed in [facility] hospital lab) *cepheid single result test* Anterior Nasal Swab     Status: None   Collection Time: 11/24/22  9:24 PM   Specimen: Anterior Nasal Swab  Result Value Ref Range   SARS Coronavirus 2 by RT PCR NEGATIVE NEGATIVE  Urinalysis, w/ Reflex to Culture (Infection Suspected) -Urine, Clean Catch     Status: Abnormal   Collection Time: 11/24/22 10:39 PM  Result Value Ref Range   Specimen Source URINE, CLEAN CATCH    Color, Urine AMBER (A) YELLOW   APPearance CLOUDY (A) CLEAR   Specific Gravity, Urine 1.016 1.005 - 1.030   pH 5.0 5.0 - 8.0   Glucose, UA  NEGATIVE NEGATIVE mg/dL   Hgb urine dipstick  NEGATIVE NEGATIVE   Bilirubin Urine NEGATIVE NEGATIVE   Ketones, ur 5 (A) NEGATIVE mg/dL   Protein, ur 161 (A) NEGATIVE mg/dL   Nitrite NEGATIVE NEGATIVE   Leukocytes,Ua MODERATE (A) NEGATIVE   RBC / HPF 0-5 0 - 5 RBC/hpf   WBC, UA 11-20 0 - 5 WBC/hpf   Bacteria, UA RARE (A) NONE SEEN   Squamous Epithelial / HPF 0-5 0 - 5 /HPF   Mucus PRESENT    Hyaline Casts, UA PRESENT   CBC     Status: Abnormal   Collection Time: 11/25/22  7:43 AM  Result Value Ref Range   WBC 6.8 4.0 - 10.5 K/uL   RBC 4.07 3.87 - 5.11 MIL/uL   Hemoglobin 12.3 12.0 - 15.0 g/dL   HCT 09.6 04.5 - 40.9 %   MCV 90.9 80.0 - 100.0 fL   MCH 30.2 26.0 - 34.0 pg   MCHC 33.2 30.0 - 36.0 g/dL   RDW 81.1 91.4 - 78.2 %   Platelets 136 (L) 150 - 400 K/uL   nRBC 0.0 0.0 - 0.2 %  Basic metabolic panel     Status: Abnormal   Collection Time: 11/25/22  7:43 AM  Result Value Ref Range   Sodium 137 135 - 145 mmol/L   Potassium 3.8 3.5 - 5.1 mmol/L   Chloride 106 98 - 111 mmol/L   CO2 25 22 - 32 mmol/L   Glucose, Bld 119 (H) 70 - 99 mg/dL   BUN 47 (H) 8 - 23 mg/dL   Creatinine, Ser 9.56 (H) 0.44 - 1.00 mg/dL   Calcium 8.4 (L) 8.9 - 10.3 mg/dL   GFR, Estimated 22 (L) >60 mL/min   Anion gap 6 5 - 15  Troponin I (High Sensitivity)     Status: Abnormal   Collection Time: 11/25/22  7:43 AM  Result Value Ref Range   Troponin I (High Sensitivity) 24 (H) <18 ng/L  CBG monitoring, ED     Status: Abnormal   Collection Time: 11/25/22  7:45 AM  Result Value Ref Range   Glucose-Capillary 129 (H) 70 - 99 mg/dL   DG Chest Portable 1 View  Result Date: 11/24/2022 CLINICAL DATA:  Weakness and nausea EXAM: PORTABLE CHEST 1 VIEW COMPARISON:  11/15/2022 FINDINGS: Stable cardiomegaly. Aortic atherosclerotic calcification. Left chest wall pacemaker. Bibasilar atelectasis/scarring. No focal pneumonia, definite pleural effusion, or pneumothorax. Reverse right TSA. IMPRESSION: No active disease.   Cardiomegaly. Electronically Signed   By: Minerva Fester M.D.   On: 11/24/2022 21:48     ASSESSMENT AND PLAN: #1 Presyncope, history of AV sequential pacemaker implantation on 11/15/2022, with a heart rate being in the 50s with lack of sensing and capture of ventricular beats.  Patient has paroxysmal atrial fibrillation and has been on amiodarone Cardizem and metoprolol which is on hold right now.  At this time heart rate is 70 bpm, will get pacemaker interrogation in the morning and resume medications gradually.   #2 Dehydration/possible UTI with elevated creatinine.  Patient has underlying chronic renal insufficiency but may have prerenal azotemia due to dehydration and UTI.  After getting IV fluids patient is much more alert and feeling better.  Will follow the patient closely with you.  Thank you very much for follow-up  [facility]

## 2022-11-26 DIAGNOSIS — R001 Bradycardia, unspecified: Secondary | ICD-10-CM | POA: Diagnosis not present

## 2022-11-26 DIAGNOSIS — N390 Urinary tract infection, site not specified: Secondary | ICD-10-CM

## 2022-11-26 DIAGNOSIS — N179 Acute kidney failure, unspecified: Secondary | ICD-10-CM | POA: Diagnosis not present

## 2022-11-26 LAB — BASIC METABOLIC PANEL
Anion gap: 10 (ref 5–15)
BUN: 41 mg/dL — ABNORMAL HIGH (ref 8–23)
CO2: 19 mmol/L — ABNORMAL LOW (ref 22–32)
Calcium: 8.1 mg/dL — ABNORMAL LOW (ref 8.9–10.3)
Chloride: 109 mmol/L (ref 98–111)
Creatinine, Ser: 1.91 mg/dL — ABNORMAL HIGH (ref 0.44–1.00)
GFR, Estimated: 25 mL/min — ABNORMAL LOW (ref >60)
Glucose, Bld: 107 mg/dL — ABNORMAL HIGH (ref 70–99)
Potassium: 4.4 mmol/L (ref 3.5–5.1)
Sodium: 138 mmol/L (ref 135–145)

## 2022-11-26 LAB — CBC
HCT: 34.5 % — ABNORMAL LOW (ref 36.0–46.0)
Hemoglobin: 11.5 g/dL — ABNORMAL LOW (ref 12.0–15.0)
MCH: 30.5 pg (ref 26.0–34.0)
MCHC: 33.3 g/dL (ref 30.0–36.0)
MCV: 91.5 fL (ref 80.0–100.0)
Platelets: 123 10*3/uL — ABNORMAL LOW (ref 150–400)
RBC: 3.77 MIL/uL — ABNORMAL LOW (ref 3.87–5.11)
RDW: 13.1 % (ref 11.5–15.5)
WBC: 5.4 10*3/uL (ref 4.0–10.5)
nRBC: 0 % (ref 0.0–0.2)

## 2022-11-26 LAB — GLUCOSE, CAPILLARY
Glucose-Capillary: 101 mg/dL — ABNORMAL HIGH (ref 70–99)
Glucose-Capillary: 125 mg/dL — ABNORMAL HIGH (ref 70–99)
Glucose-Capillary: 110 mg/dL — ABNORMAL HIGH (ref 70–99)
Glucose-Capillary: 147 mg/dL — ABNORMAL HIGH (ref 70–99)

## 2022-11-26 LAB — URINE CULTURE: Culture: <10000 — AB

## 2022-11-26 MED ORDER — MORPHINE SULFATE (PF) 2 MG/ML IV SOLN
1.0000 mg | INTRAVENOUS | Status: DC | PRN
  Administered 2022-11-29: 1 mg via INTRAVENOUS
  Filled 2022-11-26: qty 1

## 2022-11-26 MED ORDER — ISOSORBIDE MONONITRATE ER 30 MG PO TB24
15.0000 mg | ORAL_TABLET | Freq: Every day | ORAL | Status: DC
  Administered 2022-11-26 – 2022-11-27 (×2): 15 mg via ORAL
  Filled 2022-11-26 (×2): qty 1

## 2022-11-26 NOTE — Progress Notes (Signed)
  Transition of Care [facility]) Screening Note   Patient Details  Name: Leslie Parker Date of Birth: [birth year]   Transition of Care [facility]) CM/SW Contact:    Truddie Hidden, RN Phone Number: 11/26/2022, 11:48 AM    Transition of Care Department [facility]) has reviewed patient and no TOC needs have been identified at this time. We will continue to monitor patient advancement through interdisciplinary progression rounds. If new patient transition needs arise, please place a TOC consult.

## 2022-11-26 NOTE — Progress Notes (Signed)
   11/26/22 1844  Vitals  BP (!) 216/84  MAP (mmHg) 121  MEWS COLOR  MEWS Score Color Yellow  MEWS Score  MEWS Temp 0  MEWS Systolic 2  MEWS Pulse 0  MEWS RR 0  MEWS LOC 0  MEWS Score 2   Patient complaining of chest pain above pacemaker site. MD notified of chest pain and BP.

## 2022-11-26 NOTE — Progress Notes (Signed)
PROGRESS NOTE    Leslie Parker  ZOX:[id number] DOB: 03/09/1933 DOA: 11/24/2022 PCP: [name], MD   Assessment & Plan:   Principal Problem:   AKI (acute kidney injury) (HCC) Active Problems:   Bradycardia   Hypertension   Sick sinus syndrome (HCC)   CKD stage 3 due to type 2 diabetes mellitus (HCC)   UTI (urinary tract infection)   Paroxysmal atrial fibrillation (HCC)  Assessment and Plan: AKI on CKDIIIb: baseline Cr 1.6. Cr is trending down from day prior. D/c IVFs.   Bradycardia: likely medication induced. Continue on tele. Restarted amiodarone. Holding metoprolol, diltiazem.    PAF: continue on eliquis and restarted amiodarone. Holding metoprolol, diltiazem    UTI: urine cx is pending. Continue on IV rocephin   DM2: well controlled, HbA1c 6.5 in 2023. Continue on SSI w/ accuchecks  Sick sinus syndrome: s/p recent pacemaker insertion. Will have pacemaker interrogated as per cardio    HTN: holding metoprolol, diltiazem secondary to bradycardia. IV hydralazine prn      DVT prophylaxis: eliquis  Code Status:  full  Family Communication:  Disposition Plan: likely d/c back home   Level of care: Telemetry Cardiac  Status is: Inpatient Remains inpatient appropriate because: severity of illness, needs to have pacer interrogated     Consultants:  Cardio   Procedures:   Antimicrobials: rocephin   Subjective: Pt c/o fatigue  Objective: Vitals:   11/25/22 2015 11/25/22 2319 11/26/22 0355 11/26/22 0735  BP: (!) 191/84 131/67 (!) 155/68 (!) 169/83  Pulse: 72 100 70 74  Resp: 16 16 14 18   Temp: (!) 97.5 F (36.4 C) (!) 97.5 F (36.4 C) (!) 97.4 F (36.3 C) 98.3 F (36.8 C)  TempSrc: Oral Oral Oral Oral  SpO2: 96% 96% 95% 96%  Weight:      Height:        Intake/Output Summary (Last 24 hours) at 11/26/2022 0808 Last data filed at 11/25/2022 1806 Gross per 24 hour  Intake 685 ml  Output --  Net 685 ml   Filed Weights   11/24/22 2120  Weight: 89  kg    Examination:  General exam: Appears comfortable  Respiratory system: clear breath sounds b/l  Cardiovascular system: S1/S2+. No rubs or clicks  Gastrointestinal system: Abd is soft, NT, ND & normal bowel sounds. Central nervous system: alert and oriented. Moves all extremities. Psychiatry: judgement and insight appears at baseline. Appropriate mood and affect      Data Reviewed: I have personally reviewed following labs and imaging studies  CBC: Recent Labs  Lab 11/24/22 2124 11/25/22 0743 11/26/22 0500  WBC 8.1 6.8 5.4  NEUTROABS 5.2  --   --   HGB 13.0 12.3 11.5*  HCT 38.6 37.0 34.5*  MCV 91.3 90.9 91.5  PLT 156 136* 123*   Basic Metabolic Panel: Recent Labs  Lab 11/24/22 2124 11/25/22 0743 11/26/22 0500  NA 134* 137 138  K 4.4 3.8 4.4  CL 102 106 109  CO2 21* 25 19*  GLUCOSE 217* 119* 107*  BUN 57* 47* 41*  CREATININE 2.44* 2.13* 1.91*  CALCIUM 8.2* 8.4* 8.1*   GFR: Estimated Creatinine Clearance: 24.7 mL/min (A) (by C-G formula based on SCr of 1.91 mg/dL (H)). Liver Function Tests: No results for input(s): "AST", "ALT", "ALKPHOS", "BILITOT", "PROT", "ALBUMIN" in the last 168 hours. No results for input(s): "LIPASE", "AMYLASE" in the last 168 hours. No results for input(s): "AMMONIA" in the last 168 hours. Coagulation Profile: No results for input(s): "INR", "  PROTIME" in the last 168 hours. Cardiac Enzymes: No results for input(s): "CKTOTAL", "CKMB", "CKMBINDEX", "TROPONINI" in the last 168 hours. BNP (last 3 results) No results for input(s): "PROBNP" in the last 8760 hours. HbA1C: No results for input(s): "HGBA1C" in the last 72 hours. CBG: Recent Labs  Lab 11/25/22 0745 11/25/22 1132 11/25/22 1813 11/25/22 2020 11/26/22 0734  GLUCAP 129* 103* 127* 76 101*   Lipid Profile: No results for input(s): "CHOL", "HDL", "LDLCALC", "TRIG", "CHOLHDL", "LDLDIRECT" in the last 72 hours. Thyroid Function Tests: No results for input(s): "TSH",  "T4TOTAL", "FREET4", "T3FREE", "THYROIDAB" in the last 72 hours. Anemia Panel: No results for input(s): "VITAMINB12", "FOLATE", "FERRITIN", "TIBC", "IRON", "RETICCTPCT" in the last 72 hours. Sepsis Labs: No results for input(s): "PROCALCITON", "LATICACIDVEN" in the last 168 hours.  Recent Results (from the past 240 hour(s))  SARS Coronavirus 2 by RT PCR (hospital order, performed in [facility] hospital lab) *cepheid single result test* Anterior Nasal Swab     Status: None   Collection Time: 11/24/22  9:24 PM   Specimen: Anterior Nasal Swab  Result Value Ref Range Status   SARS Coronavirus 2 by RT PCR NEGATIVE NEGATIVE Final    Comment: (NOTE) SARS-CoV-2 target nucleic acids are NOT DETECTED.  The SARS-CoV-2 RNA is generally detectable in upper and lower respiratory specimens during the acute phase of infection. The lowest concentration of SARS-CoV-2 viral copies this assay can detect is 250 copies / mL. A negative result does not preclude SARS-CoV-2 infection and should not be used as the sole basis for treatment or other patient management decisions.  A negative result may occur with improper specimen collection / handling, submission of specimen other than nasopharyngeal swab, presence of viral mutation(s) within the areas targeted by this assay, and inadequate number of viral copies (<250 copies / mL). A negative result must be combined with clinical observations, patient history, and epidemiological information.  Fact Sheet for Patients:   RoadLapTop.co.za  Fact Sheet for Healthcare Providers: http://kim-miller.com/  This test is not yet approved or  cleared by the [place] FDA and has been authorized for detection and/or diagnosis of SARS-CoV-2 by FDA under an Emergency Use Authorization (EUA).  This EUA will remain in effect (meaning this test can be used) for the duration of the COVID-19 declaration under Section 564(b)(1)  of the Act, 21 U.S.C. section 360bbb-3(b)(1), unless the authorization is terminated or revoked sooner.  Performed at [facility], [address], [place], [place] [address]          Radiology Studies: DG Chest Portable 1 View  Result Date: 11/24/2022 CLINICAL DATA:  Weakness and nausea EXAM: PORTABLE CHEST 1 VIEW COMPARISON:  11/15/2022 FINDINGS: Stable cardiomegaly. Aortic atherosclerotic calcification. Left chest wall pacemaker. Bibasilar atelectasis/scarring. No focal pneumonia, definite pleural effusion, or pneumothorax. Reverse right TSA. IMPRESSION: No active disease.  Cardiomegaly. Electronically Signed   By: Minerva Fester M.D.   On: 11/24/2022 21:48        Scheduled Meds:  amiodarone  200 mg Oral Daily   apixaban  2.5 mg Oral BID   ascorbic acid  250 mg Oral Daily   colestipol  1 g Oral BID   cyanocobalamin  2,000 mcg Oral Daily   fluticasone furoate-vilanterol  1 puff Inhalation Daily   hydrALAZINE  25 mg Oral Q8H   insulin aspart  0-15 Units Subcutaneous TID WC   magnesium oxide  400 mg Oral Daily   multivitamin with minerals  1 tablet Oral Daily  traZODone  100 mg Oral QHS   Continuous Infusions:  sodium chloride 75 mL/hr at 11/25/22 1833   cefTRIAXone (ROCEPHIN)  IV 1 g (11/25/22 2116)     LOS: 2 days    Time spent: 25 mins     Charise Killian, MD [facility] Pager [phone]  If 7PM-7AM, please contact night-coverage [email or web address] 11/26/2022, 8:08 AM

## 2022-11-26 NOTE — Evaluation (Signed)
Physical Therapy Evaluation Patient Details Name: Leslie Parker MRN: [id number] DOB: 17-Apr-1933 Today's Date: 11/26/2022  History of Present Illness  89 y/o female presented to ED on 11/24/22 for weakness and nausea. Recent pacemaker placement on 4/25. Admitted for AKI and bradycardia. PMH: sick sinus syndrome s/p pacemaker placement 4/25, HTN, Afib, DM, CKD stage 3,  Clinical Impression  Patient admitted with the above. PTA, patient lives alone in a senior living apartment which is handicap accessible. She reports using rollator or cane for community mobility, but ambulates with no AD in the home. Patient presents with weakness, impaired balance, and decreased activity tolerance. Able to complete bed mobility modI and sit to stand with supervision. Ambulated 120' with RW and close supervision with no overt LOB. Patient feels close to her baseline. Patient will benefit from skilled PT services during acute stay to address listed deficits.     Recommendations for follow up therapy are one component of a multi-disciplinary discharge planning process, led by the attending physician.  Recommendations may be updated based on patient status, additional functional criteria and insurance authorization.  Follow Up Recommendations       Assistance Recommended at Discharge PRN  Patient can return home with the following  Assistance with cooking/housework;Assist for transportation;Help with stairs or ramp for entrance    Equipment Recommendations None recommended by PT  Recommendations for Other Services       Functional Status Assessment Patient has had a recent decline in their functional status and demonstrates the ability to make significant improvements in function in a reasonable and predictable amount of time.     Precautions / Restrictions Precautions Precautions: Fall Restrictions Weight Bearing Restrictions: No      Mobility  Bed Mobility Overal bed mobility: Modified Independent                   Transfers Overall transfer level: Needs assistance Equipment used: Rolling Ellis Mehaffey (2 wheels) Transfers: Sit to/from Stand Sit to Stand: Supervision           General transfer comment: supervision for safety    Ambulation/Gait Ambulation/Gait assistance: Supervision Gait Distance (Feet): 120 Feet Assistive device: Rolling Manoj Enriquez (2 wheels) Gait Pattern/deviations: Step-through pattern, Decreased stride length Gait velocity: decreased     General Gait Details: supervision for safety. Ambulates in the room with no AD  Stairs            Wheelchair Mobility    Modified Rankin (Stroke Patients Only)       Balance Overall balance assessment: Mild deficits observed, not formally tested                                           Pertinent Vitals/Pain Pain Assessment Pain Assessment: No/denies pain    Home Living Family/patient expects to be discharged to:: Private residence Living Arrangements: Alone Available Help at Discharge: Available PRN/intermittently;Friend(s);Neighbor;Family Type of Home: Apartment Home Access: Level entry       Home Layout: One level Home Equipment: Rollator (4 wheels);Cane - single point;Grab bars - tub/shower;Grab bars - toilet Additional Comments: Senior living apartment    Prior Function Prior Level of Function : Independent/Modified Independent;Driving             Mobility Comments: uses rollator for community mobility but ambulates wiht no AD in the home       Hand Dominance  Extremity/Trunk Assessment   Upper Extremity Assessment Upper Extremity Assessment: Defer to OT evaluation    Lower Extremity Assessment Lower Extremity Assessment: Generalized weakness    Cervical / Trunk Assessment Cervical / Trunk Assessment: Normal  Communication   Communication: No difficulties  Cognition Arousal/Alertness: Awake/alert Behavior During Therapy: WFL for tasks  assessed/performed Overall Cognitive Status: No family/caregiver present to determine baseline cognitive functioning                                 General Comments: seems WFL but at times decreased safety awareness        General Comments      Exercises     Assessment/Plan    PT Assessment Patient needs continued PT services  PT Problem List Decreased strength;Decreased activity tolerance;Decreased balance;Decreased mobility       PT Treatment Interventions Gait training;DME instruction;Functional mobility training;Therapeutic activities;Balance training;Therapeutic exercise;Patient/family education    PT Goals (Current goals can be found in the Care Plan section)  Acute Rehab PT Goals Patient Stated Goal: to go home PT Goal Formulation: With patient Time For Goal Achievement: 12/10/22 Potential to Achieve Goals: Good    Frequency Min 2X/week     Co-evaluation               AM-PAC PT "6 Clicks" Mobility  Outcome Measure Help needed turning from your back to your side while in a flat bed without using bedrails?: None Help needed moving from lying on your back to sitting on the side of a flat bed without using bedrails?: None Help needed moving to and from a bed to a chair (including a wheelchair)?: A Little Help needed standing up from a chair using your arms (e.g., wheelchair or bedside chair)?: A Little Help needed to walk in hospital room?: A Little Help needed climbing 3-5 steps with a railing? : A Little 6 Click Score: 20    End of Session   Activity Tolerance: Patient tolerated treatment well Patient left: Other (comment) (handoff to OT in room) Nurse Communication: Mobility status PT Visit Diagnosis: Unsteadiness on feet (R26.81);Muscle weakness (generalized) (M62.81)    Time: 1610-9604 PT Time Calculation (min) (ACUTE ONLY): 10 min   Charges:   PT Evaluation $PT Eval Low Complexity: 1 Low          [name], PT,  DPT Physical Therapist - [place]  [facility]   Kiauna Zywicki A Italo Banton 11/26/2022, 11:43 AM

## 2022-11-26 NOTE — Evaluation (Signed)
Occupational Therapy Evaluation Patient Details Name: Leslie Parker MRN: [id number] DOB: [birth year] Today's Date: 11/26/2022   History of Present Illness 89 y/o female presented to ED on 11/24/22 for weakness and nausea. Recent pacemaker placement on 4/25. Admitted for AKI and bradycardia. PMH: sick sinus syndrome s/p pacemaker placement 4/25, HTN, Afib, DM, CKD stage 3,   Clinical Impression   Patient agreeable to OT evaluation. Pt presenting with decreased independence in self care, balance, functional mobility/transfers, endurance, and safety awareness. PTA pt lived alone, was Mod I for ADLs, and received assistance from granddaughter for IADLs (driving, groceries). Pt currently functioning at supervision for toilet transfer, supervision for functional mobility using a RW, and grossly set up-supervision for self care tasks. Pt appears close to baseline level of function with ADLs. Will keep on OT caseload to continue ADL training in order to prevent functional decline and reduce caregiver burden. No follow up OT needs recommended at this time.    Recommendations for follow up therapy are one component of a multi-disciplinary discharge planning process, led by the attending physician.  Recommendations may be updated based on patient status, additional functional criteria and insurance authorization.   Assistance Recommended at Discharge PRN  Patient can return home with the following Assistance with cooking/housework;Assist for transportation;Help with stairs or ramp for entrance    Functional Status Assessment  Patient has had a recent decline in their functional status and demonstrates the ability to make significant improvements in function in a reasonable and predictable amount of time.  Equipment Recommendations  None recommended by OT    Recommendations for Other Services       Precautions / Restrictions Precautions Precautions: Fall Restrictions Weight Bearing Restrictions: No       Mobility Bed Mobility     General bed mobility comments: NT, pt received in hallway with PT and left sitting in recliner    Transfers Overall transfer level: Needs assistance Equipment used: Rolling walker (2 wheels), None Transfers: Sit to/from Stand Sit to Stand: Supervision           General transfer comment: STS from recliner and toilet      Balance Overall balance assessment: Mild deficits observed, not formally tested         ADL either performed or assessed with clinical judgement   ADL Overall ADL's : Needs assistance/impaired     Grooming: Set up;Supervision/safety;Standing;Wash/dry hands       Toilet Transfer: Supervision/safety;Ambulation;Regular Toilet;Grab bars   Toileting- Clothing Manipulation and Hygiene: Set up;Supervision/safety;Sitting/lateral lean       Functional mobility during ADLs: Supervision/safety;Rolling walker (2 wheels) (Pt received in hallway ambulating with PT using a RW, pt then deferred use of RW while going to bathroom (use of IV pole for support))       Vision Baseline Vision/History: 1 Wears glasses (readers only) Patient Visual Report: No change from baseline       Perception     Praxis      Pertinent Vitals/Pain Pain Assessment Pain Assessment: No/denies pain     Hand Dominance Right   Extremity/Trunk Assessment Upper Extremity Assessment Upper Extremity Assessment: Generalized weakness   Lower Extremity Assessment Lower Extremity Assessment: Generalized weakness   Cervical / Trunk Assessment Cervical / Trunk Assessment: Normal   Communication Communication Communication: No difficulties   Cognition Arousal/Alertness: Awake/alert Behavior During Therapy: WFL for tasks assessed/performed Overall Cognitive Status: No family/caregiver present to determine baseline cognitive functioning       General Comments: A&Ox4, seems [facility] but  at times decreased safety awareness     General Comments        Exercises Other Exercises Other Exercises: OT provided education re: role of OT, OT POC, post acute recs, sitting up for all meals, EOB/OOB mobility with assistance, home/fall safety.     Shoulder Instructions      Home Living Family/patient expects to be discharged to:: Private residence (Senior living apartment) Living Arrangements: Alone Available Help at Discharge: Available PRN/intermittently;Friend(s);Neighbor;Family Type of Home: Apartment Home Access: Level entry     Home Layout: One level     Bathroom Shower/Tub: Chief Strategy Officer: Handicapped height Bathroom Accessibility: Yes   Home Equipment: Rollator (4 wheels);Cane - single point;Grab bars - tub/shower;Grab bars - toilet   Additional Comments: Senior living apartment      Prior Functioning/Environment Prior Level of Function : Independent/Modified Independent;Driving             Mobility Comments: uses rollator for community mobility but ambulates wiht no AD in the home ADLs Comments: Mod I for ADLs. Pt reports still driving, however, granddaughter takes her to medical appointments and grocery store. Pt reports IND for med mgmt using pill box.        OT Problem List: Decreased strength;Decreased activity tolerance;Impaired balance (sitting and/or standing);Decreased safety awareness      OT Treatment/Interventions: Self-care/ADL training;Therapeutic exercise;Energy conservation;DME and/or AE instruction;Therapeutic activities;Cognitive remediation/compensation;Patient/family education;Balance training    OT Goals(Current goals can be found in the care plan section) Acute Rehab OT Goals Patient Stated Goal: return home OT Goal Formulation: With patient Time For Goal Achievement: 12/10/22 Potential to Achieve Goals: Good   OT Frequency: Min 1X/week    Co-evaluation              AM-PAC OT "6 Clicks" Daily Activity     Outcome Measure Help from another person eating meals?:  None Help from another person taking care of personal grooming?: None Help from another person toileting, which includes using toliet, bedpan, or urinal?: A Little Help from another person bathing (including washing, rinsing, drying)?: A Little Help from another person to put on and taking off regular upper body clothing?: None Help from another person to put on and taking off regular lower body clothing?: A Little 6 Click Score: 21   End of Session Equipment Utilized During Treatment: Rolling walker (2 wheels) Nurse Communication: Mobility status  Activity Tolerance: Patient tolerated treatment well Patient left: in chair;with call bell/phone within reach;with chair alarm set  OT Visit Diagnosis: Unsteadiness on feet (R26.81);Muscle weakness (generalized) (M62.81)                Time: 1610-9604 OT Time Calculation (min): 12 min Charges:  OT General Charges $OT Visit: 1 Visit OT Evaluation $OT Eval Low Complexity: 1 Low  [facility] MS, OTR/L ascom [phone]  11/26/22, 1:29 PM

## 2022-11-26 NOTE — Consult Note (Signed)
CARDIOLOGY CONSULT NOTE               Patient ID: Leslie Parker MRN: [id number] DOB/AGE: 1933/04/11 89 y.o.  Admit date: 11/24/2022 Referring Physician Dr Joylene Igo hospitalist Primary Physician Dr Nemiah Commander primary Primary Cardiologist [facility] Reason for Consultation Weakness  HPI: Pt presented to ER with generalized weakness. Pt has a history of PPM recently. She c/o of tenderness over the pacer site.  She has had some bradycardia with weakness. There has been some renal insuff. Denies cp sob.  Review of systems complete and found to be negative unless listed above     Past Medical History:  Diagnosis Date   Asthma    Atrial fibrillation with RVR (HCC)    Cancer (HCC)    Basal Cell   Diabetes mellitus without complication (HCC)    Heart murmur    Hypertension    Impetigo    Osteoarthritis    Osteopenia    Vomiting    can not due to surgery   Wears dentures    full upper and lower    Past Surgical History:  Procedure Laterality Date   ABDOMINAL HYSTERECTOMY     BACK SURGERY     BLADDER SURGERY     mesh   CARPAL TUNNEL RELEASE Bilateral    CATARACT EXTRACTION Right 2017   CATARACT EXTRACTION W/PHACO Left 02/06/2016   Procedure: CATARACT EXTRACTION PHACO AND INTRAOCULAR LENS PLACEMENT (IOC) left eye;  Surgeon: Sherald Hess, MD;  Location: [facility];  Service: Ophthalmology;  Laterality: Left;  DIABETIC LEFT Cannot arrive before 9:30   CERVICAL DISC SURGERY     CHOLECYSTECTOMY     EYE SURGERY Bilateral    Cataract Extraction with IOL   FEMUR IM NAIL Left 02/04/2015   Procedure: INTRAMEDULLARY (IM) NAIL FEMORAL;  Surgeon: Juanell Fairly, MD;  Location: [facility];  Service: Orthopedics;  Laterality: Left;   HARDWARE REMOVAL Left 01/17/2017   Procedure: HARDWARE REMOVAL;  Surgeon: Juanell Fairly, MD;  Location: [facility];  Service: Orthopedics;  Laterality: Left;   HERNIA REPAIR  2014   esophageal and gastric mesh. patient unable to throw up  d/t mesh   HIP ARTHROPLASTY Left 01/26/2017   Procedure: ARTHROPLASTY BIPOLAR HIP (HEMIARTHROPLASTY) removal hardware left hip;  Surgeon: Deeann Saint, MD;  Location: [facility];  Service: Orthopedics;  Laterality: Left;   JOINT REPLACEMENT Bilateral    knees   PACEMAKER IMPLANT N/A 11/15/2022   Procedure: PACEMAKER IMPLANT;  Surgeon: Marcina Millard, MD;  Location: [facility];  Service: Cardiovascular;  Laterality: N/A;   REPLACEMENT TOTAL KNEE BILATERAL Bilateral [id number]   SHOULDER ARTHROSCOPY WITH OPEN ROTATOR CUFF REPAIR AND DISTAL CLAVICLE ACROMINECTOMY Left 10/25/2016   Procedure: SHOULDER ARTHROSCOPY WITH OPEN ROTATOR CUFF REPAIR AND DISTAL CLAVICLE ACROMINECTOMY;  Surgeon: Juanell Fairly, MD;  Location: [facility];  Service: Orthopedics;  Laterality: Left;   THYROID SURGERY     goiter removed   TOTAL HIP REVISION Left 12/02/2017   Procedure: TOTAL HIP REVISION;  Surgeon: Lyndle Herrlich, MD;  Location: [facility];  Service: Orthopedics;  Laterality: Left;   TOTAL SHOULDER REPLACEMENT Right 2012    Medications Prior to Admission  Medication Sig Dispense Refill Last Dose   albuterol (VENTOLIN HFA) 108 (90 Base) MCG/ACT inhaler Inhale 2 puffs into the lungs every 6 (six) hours as needed.   unk at unk   amiodarone (PACERONE) 200 MG tablet Take 200 mg by mouth daily.   11/24/2022   apixaban (ELIQUIS)  2.5 MG TABS tablet Take 1 tablet (2.5 mg total) by mouth 2 (two) times daily. 60 tablet 1 11/24/2022   ascorbic acid (VITAMIN C) 250 MG CHEW Chew 250 mg by mouth daily.   11/24/2022   cephALEXin (KEFLEX) 500 MG capsule Take 1 capsule (500 mg total) by mouth 2 (two) times daily for 10 days. 20 capsule 0 11/24/2022   colestipol (COLESTID) 1 g tablet Take 1 tablet by mouth 2 (two) times daily.   11/24/2022   Cyanocobalamin (B-12) 2500 MCG TABS Take 2,500 mcg by mouth daily.   11/24/2022   diltiazem (CARDIZEM CD) 240 MG 24 hr capsule Take 1 capsule (240 mg total) by mouth daily. 30 capsule 6 11/24/2022    furosemide (LASIX) 20 MG tablet Take 20 mg by mouth daily.   11/24/2022   glipiZIDE (GLUCOTROL XL) 2.5 MG 24 hr tablet Take 2.5 mg by mouth daily with breakfast.   11/24/2022   ipratropium-albuterol (DUONEB) 0.5-2.5 (3) MG/3ML SOLN Inhale 3 mLs into the lungs every 6 (six) hours as needed.   unk at unk   Magnesium Oxide 500 MG CAPS Take 500 mg by mouth daily.   11/24/2022   metoprolol succinate (TOPROL-XL) 25 MG 24 hr tablet Take 1 tablet (25 mg total) by mouth daily. 30 tablet 1 11/24/2022   Multiple Vitamin (MULTIVITAMIN WITH MINERALS) TABS tablet Take 1 tablet by mouth daily. One-A-Day Women's Vitamin   11/24/2022   traZODone (DESYREL) 100 MG tablet Take 100 mg by mouth at bedtime.   Past Week   TRELEGY ELLIPTA 100-62.5-25 MCG/ACT AEPB Inhale 1 puff into the lungs daily.   11/24/2022   Social History   Socioeconomic History   Marital status: Divorced    Spouse name: Not on file   Number of children: Not on file   Years of education: Not on file   Highest education level: Not on file  Occupational History   Not on file  Tobacco Use   Smoking status: Never   Smokeless tobacco: Never  Vaping Use   Vaping Use: Never used  Substance and Sexual Activity   Alcohol use: No    Alcohol/week: 0.0 standard drinks of alcohol   Drug use: No   Sexual activity: Never  Other Topics Concern   Not on file  Social History Narrative   Not on file   Social Determinants of Health   Financial Resource Strain: Not on file  Food Insecurity: No Food Insecurity (11/25/2022)   Hunger Vital Sign    Worried About Running Out of Food in the Last Year: Never true    Ran Out of Food in the Last Year: Never true  Transportation Needs: No Transportation Needs (11/25/2022)   PRAPARE - Administrator, Civil Service (Medical): No    Lack of Transportation (Non-Medical): No  Physical Activity: Not on file  Stress: Not on file  Social Connections: Not on file  Intimate Partner Violence: Not At Risk  (11/25/2022)   Humiliation, Afraid, Rape, and Kick questionnaire    Fear of Current or Ex-Partner: No    Emotionally Abused: No    Physically Abused: No    Sexually Abused: No    Family History  Problem Relation Age of Onset   Heart failure Mother    Hypertension Mother    Emphysema Father       Review of systems complete and found to be negative unless listed above      PHYSICAL EXAM  General: Well developed,  well nourished, in no acute distress HEENT:  Normocephalic and atramatic Neck:  No JVD.  Lungs: Clear bilaterally to auscultation and percussion. Heart: HRRR . Normal S1 and S2 without gallops or murmurs.  Abdomen: Bowel sounds are positive, abdomen soft and non-tender  Msk:  Back normal, normal gait. Normal strength and tone for age. Extremities: No clubbing, cyanosis or edema.   Neuro: Alert and oriented X 3. Psych:  Good affect, responds appropriately  Labs:   Lab Results  Component Value Date   WBC 5.4 11/26/2022   HGB 11.5 (L) 11/26/2022   HCT 34.5 (L) 11/26/2022   MCV 91.5 11/26/2022   PLT 123 (L) 11/26/2022    Recent Labs  Lab 11/26/22 0500  NA 138  K 4.4  CL 109  CO2 19*  BUN 41*  CREATININE 1.91*  CALCIUM 8.1*  GLUCOSE 107*   Lab Results  Component Value Date   TROPONINI < 0.02 07/13/2014   No results found for: "CHOL" No results found for: "HDL" No results found for: "LDLCALC" No results found for: "TRIG" No results found for: "CHOLHDL" No results found for: "LDLDIRECT"    Radiology: DG Chest Portable 1 View  Result Date: 11/24/2022 CLINICAL DATA:  Weakness and nausea EXAM: PORTABLE CHEST 1 VIEW COMPARISON:  11/15/2022 FINDINGS: Stable cardiomegaly. Aortic atherosclerotic calcification. Left chest wall pacemaker. Bibasilar atelectasis/scarring. No focal pneumonia, definite pleural effusion, or pneumothorax. Reverse right TSA. IMPRESSION: No active disease.  Cardiomegaly. Electronically Signed   By: Minerva Fester M.D.   On: 11/24/2022  21:48   DG Chest Port 1 View  Result Date: 11/15/2022 CLINICAL DATA:  Status post placement of pacemaker EXAM: PORTABLE CHEST 1 VIEW COMPARISON:  08/22/2022 FINDINGS: Transverse diameter of heart is increased. There are no signs of alveolar pulmonary edema. Linear densities are seen in medial left lower lung field. There is interval placement of pacemaker battery in the left infraclavicular region with tips of leads in right atrium and right ventricle. There is previous reverse arthroplasty right shoulder. IMPRESSION: Interval placement of pacemaker battery in the left infraclavicular region. There is no pleural effusion or pneumothorax. Linear densities in medial left lower lung field may suggest scarring or subsegmental atelectasis. Electronically Signed   By: Ernie Avena M.D.   On: 11/15/2022 14:44   EP PPM/ICD IMPLANT  Result Date: 11/15/2022 Successful dual-chamber pacemaker generator implantation    EKG: Paced rhythm 60  ASSESSMENT AND PLAN:  Bradycardia Hypertension Sick sinus syndrome Chronic renal insufficiency stage III Diabetes Paroxysmal atrial fibrillation Status post permanent pacemaker placement .  Plan Continue telemetry Maintain adequate hydration follow-up Amiodarone atrial fibrillation anticoagulation Recommend Diltizem/METOPROLOL for rate control and Bp Agree with diabetes control S/P PPM continue pacer clinic Resume Bp control           Signed: Alwyn Pea MD 11/26/2022, 8:00 AM

## 2022-11-27 ENCOUNTER — Inpatient Hospital Stay: Payer: Medicare HMO

## 2022-11-27 DIAGNOSIS — N179 Acute kidney failure, unspecified: Secondary | ICD-10-CM | POA: Diagnosis not present

## 2022-11-27 DIAGNOSIS — R001 Bradycardia, unspecified: Secondary | ICD-10-CM | POA: Diagnosis not present

## 2022-11-27 DIAGNOSIS — N390 Urinary tract infection, site not specified: Secondary | ICD-10-CM | POA: Diagnosis not present

## 2022-11-27 LAB — CBC
HCT: 35.7 % — ABNORMAL LOW (ref 36.0–46.0)
Hemoglobin: 11.9 g/dL — ABNORMAL LOW (ref 12.0–15.0)
MCH: 30.4 pg (ref 26.0–34.0)
MCHC: 33.3 g/dL (ref 30.0–36.0)
MCV: 91.3 fL (ref 80.0–100.0)
Platelets: 135 10*3/uL — ABNORMAL LOW (ref 150–400)
RBC: 3.91 MIL/uL (ref 3.87–5.11)
RDW: 13.2 % (ref 11.5–15.5)
WBC: 7.4 10*3/uL (ref 4.0–10.5)
nRBC: 0 % (ref 0.0–0.2)

## 2022-11-27 LAB — GLUCOSE, CAPILLARY
Glucose-Capillary: 135 mg/dL — ABNORMAL HIGH (ref 70–99)
Glucose-Capillary: 98 mg/dL (ref 70–99)
Glucose-Capillary: 112 mg/dL — ABNORMAL HIGH (ref 70–99)
Glucose-Capillary: 122 mg/dL — ABNORMAL HIGH (ref 70–99)

## 2022-11-27 LAB — BASIC METABOLIC PANEL
Anion gap: 8 (ref 5–15)
BUN: 38 mg/dL — ABNORMAL HIGH (ref 8–23)
CO2: 21 mmol/L — ABNORMAL LOW (ref 22–32)
Calcium: 8.3 mg/dL — ABNORMAL LOW (ref 8.9–10.3)
Chloride: 109 mmol/L (ref 98–111)
Creatinine, Ser: 1.61 mg/dL — ABNORMAL HIGH (ref 0.44–1.00)
GFR, Estimated: 31 mL/min — ABNORMAL LOW (ref >60)
Glucose, Bld: 131 mg/dL — ABNORMAL HIGH (ref 70–99)
Potassium: 3.9 mmol/L (ref 3.5–5.1)
Sodium: 138 mmol/L (ref 135–145)

## 2022-11-27 LAB — SURGICAL PCR SCREEN
MRSA, PCR: NEGATIVE
Staphylococcus aureus: NEGATIVE

## 2022-11-27 LAB — MAGNESIUM: Magnesium: 1.8 mg/dL (ref 1.7–2.4)

## 2022-11-27 MED ORDER — METOPROLOL SUCCINATE ER 25 MG PO TB24
25.0000 mg | ORAL_TABLET | Freq: Every day | ORAL | Status: DC
  Administered 2022-11-27 – 2022-11-29 (×2): 25 mg via ORAL
  Filled 2022-11-27 (×2): qty 1

## 2022-11-27 MED ORDER — SODIUM CHLORIDE 0.9 % IV SOLN: INTRAVENOUS | Status: DC

## 2022-11-27 MED ORDER — METHOCARBAMOL 500 MG PO TABS
500.0000 mg | ORAL_TABLET | Freq: Three times a day (TID) | ORAL | Status: DC | PRN
  Administered 2022-11-27: 500 mg via ORAL
  Filled 2022-11-27: qty 1

## 2022-11-27 MED ORDER — METHOCARBAMOL 500 MG PO TABS
500.0000 mg | ORAL_TABLET | Freq: Once | ORAL | Status: AC
  Administered 2022-11-27: 500 mg via ORAL
  Filled 2022-11-27: qty 1

## 2022-11-27 MED ORDER — SODIUM CHLORIDE 0.9 % IV SOLN
80.0000 mg | INTRAVENOUS | Status: AC
  Administered 2022-11-28: 80 mg
  Filled 2022-11-27: qty 2

## 2022-11-27 MED ORDER — CEFAZOLIN SODIUM-DEXTROSE 2-4 GM/100ML-% IV SOLN
2.0000 g | INTRAVENOUS | Status: AC
  Administered 2022-11-28: 2 g via INTRAVENOUS

## 2022-11-27 MED ORDER — ISOSORBIDE MONONITRATE ER 30 MG PO TB24
30.0000 mg | ORAL_TABLET | Freq: Once | ORAL | Status: AC
  Administered 2022-11-27: 30 mg via ORAL
  Filled 2022-11-27: qty 1

## 2022-11-27 NOTE — Care Management Important Message (Signed)
Important Message  Patient Details  Name: Leslie Parker MRN: [id number] Date of Birth: 1933/01/20   Medicare Important Message Given:  Yes     [name] 11/27/2022, 11:15 AM

## 2022-11-27 NOTE — Progress Notes (Signed)
PROGRESS NOTE   HPI was taken from Dr. Joylene Igo: Leslie Parker is a 89 y.o. female with medical history significant for sick sinus syndrome status post recent pacemaker insertion, hypertension, history of atrial fibrillation, diabetes mellitus who presents to the emergency room for evaluation of sudden onset dizziness.   Patient states that she was in her usual state of health until the day of admission at about 4 PM when she got up from the couch to close the blinds and suddenly felt very dizzy and lightheaded.  She quickly sat back down in the couch.  She notes that every time she tried to get up she felt dizzy.  Symptoms are associated with nausea but no vomiting.  She denied having any tinnitus or headache.  Due to the persistence of her symptoms she presented to the ER for further evaluation. She denies having any chest pain, no shortness of breath, no fever, no chills, no cough, no abdominal pain, no changes in her bowel habits, no blurred vision or focal deficit. Vital signs in the ER showed heart rate in the 50s Abnormal labs include a serum creatinine of 2.44 compared to baseline of 1.60.  She also has pyuria Chest x-ray reviewed by me shows no evidence of acute cardiopulmonary disease Of note patient states that her dose of Cardizem was recently increased from 240 mg to 360 and she was recently started on metoprolol She received a 500 cc fluid bolus and will be admitted to the hospital for further evaluation.     JASHA BROZEK  ZOX:[id number] DOB: 1933-06-15 DOA: 11/24/2022 PCP: [name], MD   Assessment & Plan:   Principal Problem:   AKI (acute kidney injury) (HCC) Active Problems:   Bradycardia   Hypertension   Sick sinus syndrome (HCC)   CKD stage 3 due to type 2 diabetes mellitus (HCC)   UTI (urinary tract infection)   Paroxysmal atrial fibrillation (HCC)  Assessment and Plan: AKI on CKDIIIb: baseline Cr 1.6. Cr is labile. Avoid nephrotoxic meds    Bradycardia:  likely medication induced. Continue on tele. Restarted metoprolol, amo. Holding diltiazem. Will go tomorrow to cath lab for re-position of pacer leads as it is not sensing the atria or ventricles appropriately as per cardio.    PAF: continue on eliquis, amio & metoprolol. Holding diltiazem    UTI: urine cx- insignificant growth. Completed abx course  DM2: well controlled, HbA1c 6.5 in 2023. Continue on SSI w/ accuchecks  Sick sinus syndrome: s/p recent pacemaker insertion. Will go to cardiac cath for re-positioning of leads as it is not sensing the atria or ventricles appropriately as per cardio    HTN: continue on metoprolol, amio. Holding diltiazem. IV hydralazine prn       DVT prophylaxis: eliquis  Code Status:  full  Family Communication:  Disposition Plan: likely d/c back home   Level of care: Telemetry Cardiac  Status is: Inpatient Remains inpatient appropriate because: severity of illness, will go to cath lab tomorrow for pacer leads to be re-positioned     Consultants:  Cardio   Procedures:   Antimicrobials:  Subjective: Pt c/o back muscle spasms  Objective: Vitals:   11/27/22 0036 11/27/22 0207 11/27/22 0341 11/27/22 0520  BP: (!) 174/74 129/61 (!) 188/83 135/87  Pulse: 85     Resp: 18 20 17 20   Temp: 98.1 F (36.7 C)  98 F (36.7 C)   TempSrc: Oral  Oral   SpO2: 95%  96%   Weight:  Height:        Intake/Output Summary (Last 24 hours) at 11/27/2022 0815 Last data filed at 11/27/2022 0600 Gross per 24 hour  Intake 708.41 ml  Output --  Net 708.41 ml   Filed Weights   11/24/22 2120  Weight: 89 kg    Examination:  General exam: Appears calm & comfortable   Respiratory system: clear breath sounds b/l  Cardiovascular system: S1 & S2+. No rubs or clicks  Gastrointestinal system: abd is soft, NT, ND & normal bowel sounds  Central nervous system: alert and oriented. Moves all extremities  Psychiatry: judgement and insight appears at baseline.  Appropriate mood and affect     Data Reviewed: I have personally reviewed following labs and imaging studies  CBC: Recent Labs  Lab 11/24/22 2124 11/25/22 0743 11/26/22 0500 11/27/22 0412  WBC 8.1 6.8 5.4 7.4  NEUTROABS 5.2  --   --   --   HGB 13.0 12.3 11.5* 11.9*  HCT 38.6 37.0 34.5* 35.7*  MCV 91.3 90.9 91.5 91.3  PLT 156 136* 123* 135*   Basic Metabolic Panel: Recent Labs  Lab 11/24/22 2124 11/25/22 0743 11/26/22 0500 11/27/22 0412  NA 134* 137 138 138  K 4.4 3.8 4.4 3.9  CL 102 106 109 109  CO2 21* 25 19* 21*  GLUCOSE 217* 119* 107* 131*  BUN 57* 47* 41* 38*  CREATININE 2.44* 2.13* 1.91* 1.61*  CALCIUM 8.2* 8.4* 8.1* 8.3*  MG  --   --   --  1.8   GFR: Estimated Creatinine Clearance: 29.3 mL/min (A) (by C-G formula based on SCr of 1.61 mg/dL (H)). Liver Function Tests: No results for input(s): "AST", "ALT", "ALKPHOS", "BILITOT", "PROT", "ALBUMIN" in the last 168 hours. No results for input(s): "LIPASE", "AMYLASE" in the last 168 hours. No results for input(s): "AMMONIA" in the last 168 hours. Coagulation Profile: No results for input(s): "INR", "PROTIME" in the last 168 hours. Cardiac Enzymes: No results for input(s): "CKTOTAL", "CKMB", "CKMBINDEX", "TROPONINI" in the last 168 hours. BNP (last 3 results) No results for input(s): "PROBNP" in the last 8760 hours. HbA1C: No results for input(s): "HGBA1C" in the last 72 hours. CBG: Recent Labs  Lab 11/25/22 2020 11/26/22 0734 11/26/22 1249 11/26/22 1610 11/26/22 2013  GLUCAP 76 101* 125* 110* 147*   Lipid Profile: No results for input(s): "CHOL", "HDL", "LDLCALC", "TRIG", "CHOLHDL", "LDLDIRECT" in the last 72 hours. Thyroid Function Tests: No results for input(s): "TSH", "T4TOTAL", "FREET4", "T3FREE", "THYROIDAB" in the last 72 hours. Anemia Panel: No results for input(s): "VITAMINB12", "FOLATE", "FERRITIN", "TIBC", "IRON", "RETICCTPCT" in the last 72 hours. Sepsis Labs: No results for input(s):  "PROCALCITON", "LATICACIDVEN" in the last 168 hours.  Recent Results (from the past 240 hour(s))  SARS Coronavirus 2 by RT PCR (hospital order, performed in [facility] hospital lab) *cepheid single result test* Anterior Nasal Swab     Status: None   Collection Time: 11/24/22  9:24 PM   Specimen: Anterior Nasal Swab  Result Value Ref Range Status   SARS Coronavirus 2 by RT PCR NEGATIVE NEGATIVE Final    Comment: (NOTE) SARS-CoV-2 target nucleic acids are NOT DETECTED.  The SARS-CoV-2 RNA is generally detectable in upper and lower respiratory specimens during the acute phase of infection. The lowest concentration of SARS-CoV-2 viral copies this assay can detect is 250 copies / mL. A negative result does not preclude SARS-CoV-2 infection and should not be used as the sole basis for treatment or other patient management decisions.  A negative result may occur with improper specimen collection / handling, submission of specimen other than nasopharyngeal swab, presence of viral mutation(s) within the areas targeted by this assay, and inadequate number of viral copies (<250 copies / mL). A negative result must be combined with clinical observations, patient history, and epidemiological information.  Fact Sheet for Patients:   RoadLapTop.co.za  Fact Sheet for Healthcare Providers: http://kim-miller.com/  This test is not yet approved or  cleared by the [place] FDA and has been authorized for detection and/or diagnosis of SARS-CoV-2 by FDA under an Emergency Use Authorization (EUA).  This EUA will remain in effect (meaning this test can be used) for the duration of the COVID-19 declaration under Section 564(b)(1) of the Act, 21 U.S.C. section 360bbb-3(b)(1), unless the authorization is terminated or revoked sooner.  Performed at [facility], [address] [place], [place] [address]   Urine Culture     Status: Abnormal    Collection Time: 11/25/22  6:16 PM   Specimen: Urine, Random  Result Value Ref Range Status   Specimen Description   Final    URINE, RANDOM Performed at [facility], [address], [place], [place] [address]    Special Requests   Final    NONE Performed at [facility], [address] [place], [place] [address]    Culture (A)  Final    <10,000 COLONIES/mL INSIGNIFICANT GROWTH Performed at [facility], [address] [place], [place] [address]    Report Status 11/26/2022 FINAL  Final         Radiology Studies: No results found.      Scheduled Meds:  amiodarone  200 mg Oral Daily   apixaban  2.5 mg Oral BID   ascorbic acid  250 mg Oral Daily   colestipol  1 g Oral BID   cyanocobalamin  2,000 mcg Oral Daily   fluticasone furoate-vilanterol  1 puff Inhalation Daily   hydrALAZINE  25 mg Oral Q8H   insulin aspart  0-15 Units Subcutaneous TID WC   isosorbide mononitrate  15 mg Oral Daily   magnesium oxide  400 mg Oral Daily   multivitamin with minerals  1 tablet Oral Daily   traZODone  100 mg Oral QHS   Continuous Infusions:  cefTRIAXone (ROCEPHIN)  IV 1 g (11/27/22 0045)     LOS: 3 days    Time spent: 25 mins     Charise Killian, MD [facility] Pager [phone]  If 7PM-7AM, please contact night-coverage [email or web address] 11/27/2022, 8:15 AM

## 2022-11-27 NOTE — Progress Notes (Signed)
[facility] CLINIC CARDIOLOGY CONSULT NOTE       Patient ID: Leslie Parker MRN: [id number] DOB/AGE: Jul 08, 1933 89 y.o.  Admit date: 11/24/2022 Referring Physician Dr. Joylene Igo Primary Physician Dr. Nemiah Commander Primary Cardiologist Dr. Juliann Pares Reason for Consultation bradycardia  HPI: Henretter Chakrabarti. Burnum is an 89yoF with a PMH of sick sinus syndrome s/p recent [employer] DC PPM implant 11/15/2022, paroxysmal AF (eliquis), DM2, COPD, who presented to [facility] ED 11/24/2022 with sudden onset positional dizziness. She had recently seen cardiology in clinic on 5/3 for routine follow up and incision check after the pacemaker was placed. At that visit, her BP remained elevated and her home dose of cardizem was increased from 240mg  daily to 360mg  daily. She was initially bradycardic to the 50s, so BB and CCB were held with improvement in HR. IVF given for AKI with improvement.   Interval History:  - overall feeling ok, noted abdominal spasms feeling like "hiccups" overnight that improve somewhat with laying on her right side. Transient nausea and general poor appetite this AM.  No chest pain or shortness of breath, heart racing, or palpitations. Has been ambulatory in the room without recurrent dizziness.  - noted on tele pacemaker spikes dyssynchronous with QRS complexes. Native HR on tele in the 80s-90s  Review of systems complete and found to be negative unless listed above     Past Medical History:  Diagnosis Date   Asthma    Atrial fibrillation with RVR (HCC)    Cancer (HCC)    Basal Cell   Diabetes mellitus without complication (HCC)    Heart murmur    Hypertension    Impetigo    Osteoarthritis    Osteopenia    Vomiting    can not due to surgery   Wears dentures    full upper and lower    Past Surgical History:  Procedure Laterality Date   ABDOMINAL HYSTERECTOMY     BACK SURGERY     BLADDER SURGERY     mesh   CARPAL TUNNEL RELEASE Bilateral    CATARACT EXTRACTION Right 2017    CATARACT EXTRACTION W/PHACO Left 02/06/2016   Procedure: CATARACT EXTRACTION PHACO AND INTRAOCULAR LENS PLACEMENT (IOC) left eye;  Surgeon: Sherald Hess, MD;  Location: [facility];  Service: Ophthalmology;  Laterality: Left;  DIABETIC LEFT Cannot arrive before 9:30   CERVICAL DISC SURGERY     CHOLECYSTECTOMY     EYE SURGERY Bilateral    Cataract Extraction with IOL   FEMUR IM NAIL Left 02/04/2015   Procedure: INTRAMEDULLARY (IM) NAIL FEMORAL;  Surgeon: Juanell Fairly, MD;  Location: [facility];  Service: Orthopedics;  Laterality: Left;   HARDWARE REMOVAL Left 01/17/2017   Procedure: HARDWARE REMOVAL;  Surgeon: Juanell Fairly, MD;  Location: [facility];  Service: Orthopedics;  Laterality: Left;   HERNIA REPAIR  2014   esophageal and gastric mesh. patient unable to throw up d/t mesh   HIP ARTHROPLASTY Left 01/26/2017   Procedure: ARTHROPLASTY BIPOLAR HIP (HEMIARTHROPLASTY) removal hardware left hip;  Surgeon: Deeann Saint, MD;  Location: [facility];  Service: Orthopedics;  Laterality: Left;   JOINT REPLACEMENT Bilateral    knees   PACEMAKER IMPLANT N/A 11/15/2022   Procedure: PACEMAKER IMPLANT;  Surgeon: Marcina Millard, MD;  Location: [facility];  Service: Cardiovascular;  Laterality: N/A;   REPLACEMENT TOTAL KNEE BILATERAL Bilateral [id number]   SHOULDER ARTHROSCOPY WITH OPEN ROTATOR CUFF REPAIR AND DISTAL CLAVICLE ACROMINECTOMY Left 10/25/2016   Procedure: SHOULDER ARTHROSCOPY WITH OPEN ROTATOR  CUFF REPAIR AND DISTAL CLAVICLE ACROMINECTOMY;  Surgeon: Juanell Fairly, MD;  Location: [facility];  Service: Orthopedics;  Laterality: Left;   THYROID SURGERY     goiter removed   TOTAL HIP REVISION Left 12/02/2017   Procedure: TOTAL HIP REVISION;  Surgeon: Lyndle Herrlich, MD;  Location: [facility];  Service: Orthopedics;  Laterality: Left;   TOTAL SHOULDER REPLACEMENT Right 2012    Medications Prior to Admission  Medication Sig Dispense Refill Last Dose   albuterol  (VENTOLIN HFA) 108 (90 Base) MCG/ACT inhaler Inhale 2 puffs into the lungs every 6 (six) hours as needed.   unk at unk   amiodarone (PACERONE) 200 MG tablet Take 200 mg by mouth daily.   11/24/2022   apixaban (ELIQUIS) 2.5 MG TABS tablet Take 1 tablet (2.5 mg total) by mouth 2 (two) times daily. 60 tablet 1 11/24/2022   ascorbic acid (VITAMIN C) 250 MG CHEW Chew 250 mg by mouth daily.   11/24/2022   [EXPIRED] cephALEXin (KEFLEX) 500 MG capsule Take 1 capsule (500 mg total) by mouth 2 (two) times daily for 10 days. 20 capsule 0 11/24/2022   colestipol (COLESTID) 1 g tablet Take 1 tablet by mouth 2 (two) times daily.   11/24/2022   Cyanocobalamin (B-12) 2500 MCG TABS Take 2,500 mcg by mouth daily.   11/24/2022   diltiazem (CARDIZEM CD) 240 MG 24 hr capsule Take 1 capsule (240 mg total) by mouth daily. 30 capsule 6 11/24/2022   furosemide (LASIX) 20 MG tablet Take 20 mg by mouth daily.   11/24/2022   glipiZIDE (GLUCOTROL XL) 2.5 MG 24 hr tablet Take 2.5 mg by mouth daily with breakfast.   11/24/2022   ipratropium-albuterol (DUONEB) 0.5-2.5 (3) MG/3ML SOLN Inhale 3 mLs into the lungs every 6 (six) hours as needed.   unk at unk   Magnesium Oxide 500 MG CAPS Take 500 mg by mouth daily.   11/24/2022   metoprolol succinate (TOPROL-XL) 25 MG 24 hr tablet Take 1 tablet (25 mg total) by mouth daily. 30 tablet 1 11/24/2022   Multiple Vitamin (MULTIVITAMIN WITH MINERALS) TABS tablet Take 1 tablet by mouth daily. One-A-Day Women's Vitamin   11/24/2022   traZODone (DESYREL) 100 MG tablet Take 100 mg by mouth at bedtime.   Past Week   TRELEGY ELLIPTA 100-62.5-25 MCG/ACT AEPB Inhale 1 puff into the lungs daily.   11/24/2022   Social History   Socioeconomic History   Marital status: Divorced    Spouse name: Not on file   Number of children: Not on file   Years of education: Not on file   Highest education level: Not on file  Occupational History   Not on file  Tobacco Use   Smoking status: Never   Smokeless tobacco: Never  Vaping  Use   Vaping Use: Never used  Substance and Sexual Activity   Alcohol use: No    Alcohol/week: 0.0 standard drinks of alcohol   Drug use: No   Sexual activity: Never  Other Topics Concern   Not on file  Social History Narrative   Not on file   Social Determinants of Health   Financial Resource Strain: Not on file  Food Insecurity: No Food Insecurity (11/25/2022)   Hunger Vital Sign    Worried About Running Out of Food in the Last Year: Never true    Ran Out of Food in the Last Year: Never true  Transportation Needs: No Transportation Needs (11/25/2022)   PRAPARE - Transportation  Lack of Transportation (Medical): No    Lack of Transportation (Non-Medical): No  Physical Activity: Not on file  Stress: Not on file  Social Connections: Not on file  Intimate Partner Violence: Not At Risk (11/25/2022)   Humiliation, Afraid, Rape, and Kick questionnaire    Fear of Current or Ex-Partner: No    Emotionally Abused: No    Physically Abused: No    Sexually Abused: No    Family History  Problem Relation Age of Onset   Heart failure Mother    Hypertension Mother    Emphysema Father       Intake/Output Summary (Last 24 hours) at 11/27/2022 1343 Last data filed at 11/27/2022 0600 Gross per 24 hour  Intake 468.41 ml  Output --  Net 468.41 ml    Vitals:   11/27/22 0207 11/27/22 0341 11/27/22 0520 11/27/22 0829  BP: 129/61 (!) 188/83 135/87 (!) 140/72  Pulse:    (!) 105  Resp: 20 17 20 20   Temp:  98 F (36.7 C)  98.4 F (36.9 C)  TempSrc:  Oral  Oral  SpO2:  96%  96%  Weight:      Height:        PHYSICAL EXAM General: pleasant elderly caucasian female , well nourished, in no acute distress. Laying at incline in bed HEENT:  Normocephalic and atraumatic. Neck:  No JVD.  Chest: L linear infraclavicular incision well healed without tenderness to palpation, erythema, significant ecchymosis, or discharge.  Lungs: Normal respiratory effort on room air. Clear bilaterally to  auscultation. No wheezes, crackles, rhonchi.  Heart: HRRR . Normal S1 and S2 without gallops or murmurs.  Abdomen: Non-distended appearing.  Msk: Normal strength and tone for age. Extremities: Warm and well perfused. No clubbing, cyanosis. No peripheral edema.  Neuro: Alert and oriented X 3. Psych:  Answers questions appropriately.   Labs: Basic Metabolic Panel: Recent Labs    11/26/22 0500 11/27/22 0412  NA 138 138  K 4.4 3.9  CL 109 109  CO2 19* 21*  GLUCOSE 107* 131*  BUN 41* 38*  CREATININE 1.91* 1.61*  CALCIUM 8.1* 8.3*  MG  --  1.8   Liver Function Tests: No results for input(s): "AST", "ALT", "ALKPHOS", "BILITOT", "PROT", "ALBUMIN" in the last 72 hours. No results for input(s): "LIPASE", "AMYLASE" in the last 72 hours. CBC: Recent Labs    11/24/22 2124 11/25/22 0743 11/26/22 0500 11/27/22 0412  WBC 8.1   < > 5.4 7.4  NEUTROABS 5.2  --   --   --   HGB 13.0   < > 11.5* 11.9*  HCT 38.6   < > 34.5* 35.7*  MCV 91.3   < > 91.5 91.3  PLT 156   < > 123* 135*   < > = values in this interval not displayed.   Cardiac Enzymes: Recent Labs    11/25/22 0743  TROPONINIHS 24*   BNP: No results for input(s): "BNP" in the last 72 hours. D-Dimer: No results for input(s): "DDIMER" in the last 72 hours. Hemoglobin A1C: No results for input(s): "HGBA1C" in the last 72 hours. Fasting Lipid Panel: No results for input(s): "CHOL", "HDL", "LDLCALC", "TRIG", "CHOLHDL", "LDLDIRECT" in the last 72 hours. Thyroid Function Tests: No results for input(s): "TSH", "T4TOTAL", "T3FREE", "THYROIDAB" in the last 72 hours.  Invalid input(s): "FREET3" Anemia Panel: No results for input(s): "VITAMINB12", "FOLATE", "FERRITIN", "TIBC", "IRON", "RETICCTPCT" in the last 72 hours.   Radiology: DG Chest Portable 1 View  Result Date: 11/24/2022  CLINICAL DATA:  Weakness and nausea EXAM: PORTABLE CHEST 1 VIEW COMPARISON:  11/15/2022 FINDINGS: Stable cardiomegaly. Aortic atherosclerotic  calcification. Left chest wall pacemaker. Bibasilar atelectasis/scarring. No focal pneumonia, definite pleural effusion, or pneumothorax. Reverse right TSA. IMPRESSION: No active disease.  Cardiomegaly. Electronically Signed   By: Minerva Fester M.D.   On: 11/24/2022 21:48   DG Chest Port 1 View  Result Date: 11/15/2022 CLINICAL DATA:  Status post placement of pacemaker EXAM: PORTABLE CHEST 1 VIEW COMPARISON:  08/22/2022 FINDINGS: Transverse diameter of heart is increased. There are no signs of alveolar pulmonary edema. Linear densities are seen in medial left lower lung field. There is interval placement of pacemaker battery in the left infraclavicular region with tips of leads in right atrium and right ventricle. There is previous reverse arthroplasty right shoulder. IMPRESSION: Interval placement of pacemaker battery in the left infraclavicular region. There is no pleural effusion or pneumothorax. Linear densities in medial left lower lung field may suggest scarring or subsegmental atelectasis. Electronically Signed   By: Ernie Avena M.D.   On: 11/15/2022 14:44   EP PPM/ICD IMPLANT  Result Date: 11/15/2022 Successful dual-chamber pacemaker generator implantation    ECHO 06/06/2022  1. Left ventricular ejection fraction, by estimation, is 60 to 65%. The  left ventricle has normal function. The left ventricle has no regional  wall motion abnormalities. There is mild left ventricular hypertrophy.  Left ventricular diastolic parameters  are indeterminate.   2. Right ventricular systolic function is normal. The right ventricular  size is normal.   3. Left atrial size was mildly dilated.   4. The mitral valve is degenerative. Mild mitral valve regurgitation.   5. Tricuspid valve regurgitation is mild to moderate.   6. The aortic valve is calcified. Aortic valve regurgitation is not  visualized. Mild aortic valve stenosis. Aortic valve mean gradient  measures 11.5 mmHg.   7. The inferior  vena cava is normal in size with greater than 50%  respiratory variability, suggesting right atrial pressure of 3 mmHg.   TELEMETRY reviewed by me (LT) 11/27/2022 : underlying AF with controlled rate 80s-90s with pacemaker spikes dyssynchronous with QRS complexes,  likely failing to capture   EKG reviewed by me: accelerated junctional rhythm? Rate 60s  Data reviewed by me (LT) 11/27/2022: outpatient cardiology note, ed note, hospitalist progress note, last 24h vitals tele labs imaging I/O    Principal Problem:   AKI (acute kidney injury) (HCC) Active Problems:   Hypertension   Sick sinus syndrome (HCC)   CKD stage 3 due to type 2 diabetes mellitus (HCC)   UTI (urinary tract infection)   Bradycardia   Paroxysmal atrial fibrillation (HCC)    ASSESSMENT AND PLAN:  Elianis Mattas. Talmadge is an 39yoF with a PMH of sick sinus syndrome s/p recent [employer] DC PPM implant 11/15/2022, paroxysmal AF (eliquis), DM2, COPD, who presented to [facility] ED 11/24/2022 with sudden onset positional dizziness. She had recently seen cardiology in clinic on 5/3 for routine follow up and incision check after the pacemaker was placed. At that visit, her BP remained elevated and her home dose of cardizem was increased from 240mg  daily to 360mg  daily. She was initially bradycardic to the 50s, so BB and CCB were held with improvement in HR. IVF given for AKI with improvement.   # symptomatic bradycardia # hx SSS s/p [employer] DC PPM implant 11/15/22 # pacemaker lead dislodgement / failure to capture  # paroxysmal AF  Presents with positional dizziness and bradycardia  after up titration of home cardizem by her regular cardiologist for persistent hypertension despite multiple agents. On tele today noted dyssynchrony with pacemaker spikes & QRS complexes + patient having abdominal spasms. Bedside device interrogation showed failure to capture for both atrial and ventricular lead, likely indicating both leads have been  dislodged, confirmed on CXR showing both leads retracted with tips overlying the brachiocephalic vein in the central SVC.  - hold cardizem and metoprolol - hold further doses of eliquis 2.5mg  BID  - discussed risks & benefits of pacemaker lead repositioning with the patient and she is agreeable to proceed. Will plan for procedure tomorrow AM at 7:30 with Dr. Darrold Junker. NPO after midnight except for sips with meds. Reinforced importance for left arm precautions.   This patient's plan of care was discussed and created with Dr. Darrold Junker and he is in agreement.  Signed: Rebeca Allegra , PA-C 11/27/2022, 1:43 PM [facility]

## 2022-11-27 NOTE — Progress Notes (Signed)
Pt reports "twitching" to right lower back and right flank area off and on. Denies any pain with it.   RN able to see some twitching to muscle area in back/flank. NP notified and robaxin given and pt able to go to sleep.

## 2022-11-28 ENCOUNTER — Inpatient Hospital Stay: Payer: Medicare HMO

## 2022-11-28 ENCOUNTER — Encounter
Admission: EM | Disposition: A | Discharge status: Home / Self Care | Payer: Self-pay | Source: Home / Self Care | Attending: Internal Medicine

## 2022-11-28 ENCOUNTER — Encounter: Payer: Self-pay | Admitting: Internal Medicine

## 2022-11-28 DIAGNOSIS — R001 Bradycardia, unspecified: Secondary | ICD-10-CM | POA: Diagnosis not present

## 2022-11-28 DIAGNOSIS — T82110A Breakdown (mechanical) of cardiac electrode, initial encounter: Secondary | ICD-10-CM

## 2022-11-28 DIAGNOSIS — N179 Acute kidney failure, unspecified: Secondary | ICD-10-CM | POA: Diagnosis not present

## 2022-11-28 HISTORY — PX: PACEMAKER IMPLANT: EP1218

## 2022-11-28 HISTORY — DX: Breakdown (mechanical) of cardiac electrode, initial encounter: T82.110A

## 2022-11-28 LAB — BASIC METABOLIC PANEL
Anion gap: 6 (ref 5–15)
BUN: 33 mg/dL — ABNORMAL HIGH (ref 8–23)
CO2: 22 mmol/L (ref 22–32)
Calcium: 8.2 mg/dL — ABNORMAL LOW (ref 8.9–10.3)
Chloride: 109 mmol/L (ref 98–111)
Creatinine, Ser: 1.55 mg/dL — ABNORMAL HIGH (ref 0.44–1.00)
GFR, Estimated: 32 mL/min — ABNORMAL LOW (ref >60)
Glucose, Bld: 109 mg/dL — ABNORMAL HIGH (ref 70–99)
Potassium: 4 mmol/L (ref 3.5–5.1)
Sodium: 137 mmol/L (ref 135–145)

## 2022-11-28 LAB — CBC
HCT: 33.1 % — ABNORMAL LOW (ref 36.0–46.0)
Hemoglobin: 11.4 g/dL — ABNORMAL LOW (ref 12.0–15.0)
MCH: 31.1 pg (ref 26.0–34.0)
MCHC: 34.4 g/dL (ref 30.0–36.0)
MCV: 90.2 fL (ref 80.0–100.0)
Platelets: 125 10*3/uL — ABNORMAL LOW (ref 150–400)
RBC: 3.67 MIL/uL — ABNORMAL LOW (ref 3.87–5.11)
RDW: 13.2 % (ref 11.5–15.5)
WBC: 6.4 10*3/uL (ref 4.0–10.5)
nRBC: 0 % (ref 0.0–0.2)

## 2022-11-28 LAB — GLUCOSE, CAPILLARY
Glucose-Capillary: 123 mg/dL — ABNORMAL HIGH (ref 70–99)
Glucose-Capillary: 126 mg/dL — ABNORMAL HIGH (ref 70–99)
Glucose-Capillary: 148 mg/dL — ABNORMAL HIGH (ref 70–99)
Glucose-Capillary: 102 mg/dL — ABNORMAL HIGH (ref 70–99)

## 2022-11-28 SURGERY — PACEMAKER IMPLANT
Anesthesia: Moderate Sedation

## 2022-11-28 MED ORDER — FENTANYL CITRATE (PF) 100 MCG/2ML IJ SOLN
INTRAMUSCULAR | Status: AC
  Filled 2022-11-28: qty 2

## 2022-11-28 MED ORDER — LIDOCAINE HCL (PF) 1 % IJ SOLN
INTRAMUSCULAR | Status: DC | PRN
  Administered 2022-11-28: 20 mL

## 2022-11-28 MED ORDER — ACETAMINOPHEN 325 MG PO TABS: 325.0000 mg | ORAL_TABLET | ORAL | Status: DC | PRN

## 2022-11-28 MED ORDER — ONDANSETRON HCL 4 MG/2ML IJ SOLN: 4.0000 mg | Freq: Four times a day (QID) | INTRAMUSCULAR | Status: DC | PRN

## 2022-11-28 MED ORDER — DILTIAZEM HCL 25 MG/5ML IV SOLN
INTRAVENOUS | Status: AC
  Filled 2022-11-28: qty 5

## 2022-11-28 MED ORDER — HEPARIN (PORCINE) IN NACL 1000-0.9 UT/500ML-% IV SOLN
INTRAVENOUS | Status: AC
  Filled 2022-11-28: qty 1000

## 2022-11-28 MED ORDER — MIDAZOLAM HCL 2 MG/2ML IJ SOLN
INTRAMUSCULAR | Status: AC
  Filled 2022-11-28: qty 2

## 2022-11-28 MED ORDER — CEFAZOLIN SODIUM-DEXTROSE 1-4 GM/50ML-% IV SOLN
1.0000 g | Freq: Four times a day (QID) | INTRAVENOUS | Status: AC
  Administered 2022-11-28 – 2022-11-29 (×2): 1 g via INTRAVENOUS
  Filled 2022-11-28 (×3): qty 50

## 2022-11-28 MED ORDER — MIDAZOLAM HCL 2 MG/2ML IJ SOLN
INTRAMUSCULAR | Status: DC | PRN
  Administered 2022-11-28: 1 mg via INTRAVENOUS

## 2022-11-28 MED ORDER — FENTANYL CITRATE (PF) 100 MCG/2ML IJ SOLN
INTRAMUSCULAR | Status: DC | PRN
  Administered 2022-11-28: 25 ug via INTRAVENOUS

## 2022-11-28 MED ORDER — DILTIAZEM HCL 25 MG/5ML IV SOLN
INTRAVENOUS | Status: DC | PRN
  Administered 2022-11-28: 12.5 mg via INTRAVENOUS

## 2022-11-28 MED ORDER — DILTIAZEM HCL 30 MG PO TABS
90.0000 mg | ORAL_TABLET | Freq: Two times a day (BID) | ORAL | Status: DC
  Administered 2022-11-28 (×2): 90 mg via ORAL
  Filled 2022-11-28 (×2): qty 3

## 2022-11-28 MED ORDER — CEFAZOLIN SODIUM-DEXTROSE 2-4 GM/100ML-% IV SOLN
INTRAVENOUS | Status: AC
  Filled 2022-11-28: qty 100

## 2022-11-28 SURGICAL SUPPLY — 16 items
CABLE SURG 12 DISP A/V CHANNEL (MISCELLANEOUS) IMPLANT
DEVICE DSSCT PLSMBLD 3.0S LGHT (MISCELLANEOUS) IMPLANT
DRAPE INCISE 23X17 STRL (DRAPES) IMPLANT
DRAPE INCISE IOBAN 23X17 STRL (DRAPES) ×1 IMPLANT
ELECT REM PT RETURN 9FT ADLT (ELECTROSURGICAL) ×1
ELECTRODE REM PT RTRN 9FT ADLT (ELECTROSURGICAL) IMPLANT
KIT SYRINGE INJ CVI SPIKEX1 (MISCELLANEOUS) IMPLANT
PAD ELECT DEFIB RADIOL ZOLL (MISCELLANEOUS) IMPLANT
PLASMABLADE 3.0S W/LIGHT (MISCELLANEOUS) ×1
POUCH AIGIS-R ANTIBACT PPM (Mesh General) ×1 IMPLANT
POUCH AIGIS-R ANTIBACT PPM MED (Mesh General) IMPLANT
SLING ARM IMMOBILIZER LRG (SOFTGOODS) IMPLANT
SUT SILK 0 FSL (SUTURE) IMPLANT
SUT VIC AB 2-0 CT2 27 (SUTURE) IMPLANT
SUT VIC AB 4-0 PS2 18 (SUTURE) IMPLANT
TRAY PACEMAKER INSERTION (PACKS) ×1 IMPLANT

## 2022-11-28 NOTE — Progress Notes (Addendum)
Progress Note    Leslie Parker  OZH:[id number] DOB: 09-Jul-1933  DOA: 11/24/2022 PCP: [name], MD      Brief Narrative:    Medical records reviewed and are as summarized below:  Leslie Parker is a 89 y.o. female with medical history significant for recent pacemaker placement on 11/15/2022 for sick sinus syndrome, COPD, asthma, type II DM, CKD stage IIIb, hypertension, atrial fibrillation on Eliquis, chronic diastolic CHF, osteoarthritis, history of left hip hemiarthroplasty, diaphragmatic hernia.  She presented to the emergency department because of dizziness and general weakness.  She was recently started on metoprolol and her Cardizem was increased from 240 to 360 mg daily.  She was noted to have heart rate in the 50s and elevated creatinine of 2.44 which is above her baseline of around 1.6.  She was admitted to the hospital for acute kidney injury and bradycardia.  She was recently had a pacemaker placed on 11/15/2022 and was discovered that her pacemaker was not sensing properly and her pacemaker leads were dislodged.  She was seen in consultation by cardiologist and she underwent successful repositioning of atrial and ventricular pacemaker leads.         Assessment/Plan:   Principal Problem:   AKI (acute kidney injury) (HCC) Active Problems:   Bradycardia   Hypertension   Sick sinus syndrome (HCC)   CKD stage 3 due to type 2 diabetes mellitus (HCC)   UTI (urinary tract infection)   Paroxysmal atrial fibrillation (HCC)   Pacemaker lead malfunction    Body mass index is 28.98 kg/m.    AKI on CKD stage IIIb: Improved.  Monitor BMP.   Bradycardia, dislodged pacemaker lead send failure to capture: S/p successful repositioning of atrial and ventricular pacemaker leads on 11/28/2022.   Hypertensive urgency: BP is better.  Continue Cardizem, hydralazine and metoprolol.   Paroxysmal atrial fibrillation: Continue Eliquis, amiodarone, metoprolol and  Cardizem   Suspected acute UTI: Completed antibiotics.  No significant growth on urine culture.   Other comorbidities include chronic diastolic CHF, COPD, asthma, type II DM  Diet Order             Diet Heart Room service appropriate? Yes; Fluid consistency: Thin  Diet effective now                            Consultants: Cardiologist  Procedures: Repositioning of atrial and ventricular pacemaker leads on 11/28/2022    Medications:    amiodarone  200 mg Oral Daily   ascorbic acid  250 mg Oral Daily   colestipol  1 g Oral BID   cyanocobalamin  2,000 mcg Oral Daily   diltiazem  90 mg Oral Q12H   fluticasone furoate-vilanterol  1 puff Inhalation Daily   hydrALAZINE  25 mg Oral Q8H   insulin aspart  0-15 Units Subcutaneous TID WC   magnesium oxide  400 mg Oral Daily   metoprolol succinate  25 mg Oral Daily   multivitamin with minerals  1 tablet Oral Daily   traZODone  100 mg Oral QHS   Continuous Infusions:   ceFAZolin (ANCEF) IV       Anti-infectives (From admission, onward)    Start     Dose/Rate Route Frequency Ordered Stop   11/28/22 1400  ceFAZolin (ANCEF) IVPB 1 g/50 mL premix        1 g 100 mL/hr over 30 Minutes Intravenous Every 6 hours 11/28/22 0946 11/29/22 0759  11/28/22 0600  gentamicin (GARAMYCIN) 80 mg in sodium chloride 0.9 % 500 mL irrigation        80 mg Irrigation On call 11/27/22 2035 11/28/22 0756   11/28/22 0600  ceFAZolin (ANCEF) IVPB 2g/100 mL premix        2 g 200 mL/hr over 30 Minutes Intravenous On call 11/27/22 2035 11/28/22 1047   11/24/22 2345  cephALEXin (KEFLEX) capsule 500 mg  Status:  Discontinued        500 mg Oral 2 times daily 11/24/22 2339 11/24/22 2342   11/24/22 2345  cefTRIAXone (ROCEPHIN) 1 g in sodium chloride 0.9 % 100 mL IVPB  Status:  Discontinued        1 g 200 mL/hr over 30 Minutes Intravenous Every 24 hours 11/24/22 2342 11/27/22 1241              Family Communication/Anticipated D/C date  and plan/Code Status   DVT prophylaxis:      Code Status: Full Code  Family Communication: Plan discussed with Leslie Parker (granddaughter and HPOA) at the bedside Disposition Plan: Plan to discharge home tomorrow   Status is: Inpatient Remains inpatient appropriate because: S/p repositioning of pacemaker leads.       Subjective:   Interval events noted.  No shortness of breath, chest pain or dizziness.  She feels better.  Leslie Parker, granddaughter, was at the bedside.  Objective:    Vitals:   11/28/22 0945 11/28/22 1000 11/28/22 1057 11/28/22 1412  BP: (!) 149/94 (!) 140/90 (!) 140/75 135/86  Pulse: 98 (!) 104 95 (!) 104  Resp: 16 16 20 18   Temp:   97.9 F (36.6 C) 98 F (36.7 C)  TempSrc:   Oral   SpO2: 96% 96% 97% 99%  Weight:      Height:       No data found.   Intake/Output Summary (Last 24 hours) at 11/28/2022 1446 Last data filed at 11/28/2022 1146 Gross per 24 hour  Intake 946.95 ml  Output --  Net 946.95 ml   Filed Weights   11/24/22 2120  Weight: 89 kg    Exam:  GEN: NAD SKIN: Warm and dry EYES: No pallor or icterus ENT: MMM CV: RRR PULM: CTA B ABD: soft, ND, NT, +BS CNS: AAO x 3, non focal EXT: No edema or tenderness        Data Reviewed:   I have personally reviewed following labs and imaging studies:  Labs: Labs show the following:   Basic Metabolic Panel: Recent Labs  Lab 11/24/22 2124 11/25/22 0743 11/26/22 0500 11/27/22 0412 11/28/22 0426  NA 134* 137 138 138 137  K 4.4 3.8 4.4 3.9 4.0  CL 102 106 109 109 109  CO2 21* 25 19* 21* 22  GLUCOSE 217* 119* 107* 131* 109*  BUN 57* 47* 41* 38* 33*  CREATININE 2.44* 2.13* 1.91* 1.61* 1.55*  CALCIUM 8.2* 8.4* 8.1* 8.3* 8.2*  MG  --   --   --  1.8  --    GFR Estimated Creatinine Clearance: 30.4 mL/min (A) (by C-G formula based on SCr of 1.55 mg/dL (H)). Liver Function Tests: No results for input(s): "AST", "ALT", "ALKPHOS", "BILITOT", "PROT", "ALBUMIN" in the last 168  hours. No results for input(s): "LIPASE", "AMYLASE" in the last 168 hours. No results for input(s): "AMMONIA" in the last 168 hours. Coagulation profile No results for input(s): "INR", "PROTIME" in the last 168 hours.  CBC: Recent Labs  Lab 11/24/22 2124 11/25/22 0743 11/26/22 0500 11/27/22  1610 11/28/22 0426  WBC 8.1 6.8 5.4 7.4 6.4  NEUTROABS 5.2  --   --   --   --   HGB 13.0 12.3 11.5* 11.9* 11.4*  HCT 38.6 37.0 34.5* 35.7* 33.1*  MCV 91.3 90.9 91.5 91.3 90.2  PLT 156 136* 123* 135* 125*   Cardiac Enzymes: No results for input(s): "CKTOTAL", "CKMB", "CKMBINDEX", "TROPONINI" in the last 168 hours. BNP (last 3 results) No results for input(s): "PROBNP" in the last 8760 hours. CBG: Recent Labs  Lab 11/27/22 1250 11/27/22 1654 11/27/22 2043 11/28/22 0717 11/28/22 1059  GLUCAP 98 112* 122* 123* 126*   D-Dimer: No results for input(s): "DDIMER" in the last 72 hours. Hgb A1c: No results for input(s): "HGBA1C" in the last 72 hours. Lipid Profile: No results for input(s): "CHOL", "HDL", "LDLCALC", "TRIG", "CHOLHDL", "LDLDIRECT" in the last 72 hours. Thyroid function studies: No results for input(s): "TSH", "T4TOTAL", "T3FREE", "THYROIDAB" in the last 72 hours.  Invalid input(s): "FREET3" Anemia work up: No results for input(s): "VITAMINB12", "FOLATE", "FERRITIN", "TIBC", "IRON", "RETICCTPCT" in the last 72 hours. Sepsis Labs: Recent Labs  Lab 11/25/22 0743 11/26/22 0500 11/27/22 0412 11/28/22 0426  WBC 6.8 5.4 7.4 6.4    Microbiology Recent Results (from the past 240 hour(s))  SARS Coronavirus 2 by RT PCR (hospital order, performed in [facility] hospital lab) *cepheid single result test* Anterior Nasal Swab     Status: None   Collection Time: 11/24/22  9:24 PM   Specimen: Anterior Nasal Swab  Result Value Ref Range Status   SARS Coronavirus 2 by RT PCR NEGATIVE NEGATIVE Final    Comment: (NOTE) SARS-CoV-2 target nucleic acids are NOT DETECTED.  The  SARS-CoV-2 RNA is generally detectable in upper and lower respiratory specimens during the acute phase of infection. The lowest concentration of SARS-CoV-2 viral copies this assay can detect is 250 copies / mL. A negative result does not preclude SARS-CoV-2 infection and should not be used as the sole basis for treatment or other patient management decisions.  A negative result may occur with improper specimen collection / handling, submission of specimen other than nasopharyngeal swab, presence of viral mutation(s) within the areas targeted by this assay, and inadequate number of viral copies (<250 copies / mL). A negative result must be combined with clinical observations, patient history, and epidemiological information.  Fact Sheet for Patients:   RoadLapTop.co.za  Fact Sheet for Healthcare Providers: http://kim-miller.com/  This test is not yet approved or  cleared by the [place] FDA and has been authorized for detection and/or diagnosis of SARS-CoV-2 by FDA under an Emergency Use Authorization (EUA).  This EUA will remain in effect (meaning this test can be used) for the duration of the COVID-19 declaration under Section 564(b)(1) of the Act, 21 U.S.C. section 360bbb-3(b)(1), unless the authorization is terminated or revoked sooner.  Performed at [facility], [address], [place], [place] [address]   Urine Culture     Status: Abnormal   Collection Time: 11/25/22  6:16 PM   Specimen: Urine, Random  Result Value Ref Range Status   Specimen Description   Final    URINE, RANDOM Performed at [facility], [address] [place], [place] [address]    Special Requests   Final    NONE Performed at [facility], [address] [place], [place] [address]    Culture (A)  Final    <10,000 COLONIES/mL INSIGNIFICANT GROWTH Performed at [facility], [address] [place], [place]  [address]    Report Status 11/26/2022 FINAL  Final  Surgical PCR screen     Status: None   Collection Time: 11/27/22  8:50 PM   Specimen: Nasal Mucosa; Nasal Swab  Result Value Ref Range Status   MRSA, PCR NEGATIVE NEGATIVE Final   Staphylococcus aureus NEGATIVE NEGATIVE Final    Comment: (NOTE) The Xpert SA Assay (FDA approved for NASAL specimens in patients 67 years of age and older), is one component of a comprehensive surveillance program. It is not intended to diagnose infection nor to guide or monitor treatment. Performed at [facility], [address] [place], [place] [address]     Procedures and diagnostic studies:  EP PPM/ICD IMPLANT  Result Date: 11/28/2022 Successful repositioning of atrial and ventricular pacemaker leads   DG Chest 2 View  Result Date: 11/27/2022 CLINICAL DATA:  Status post pacemaker placement EXAM: CHEST - 2 VIEW COMPARISON:  X-ray 11/24/2022 FINDINGS: Enlarged heart. Calcified aorta. Left chest pacemaker. The leads however are retracted with tips overlying the left brachiocephalic vein in the central SVC above the heart. There is more portions of the wires overlapping the pacemaker battery pack. There is some linear opacity at the bases likely scar or atelectasis. No pneumothorax or edema. No consolidation. Overlapping cardiac leads. Right shoulder reverse arthroplasty. Critical Value/emergent results were called by telephone at the time of interpretation on 11/27/2022 at 11:10 am to provider Jodie Echevaria , who verbally acknowledged these results. IMPRESSION: Retracted pacemaker leads now with the tips overlying the brachiocephalic vein in the central SVC. Electronically Signed   By: Karen Kays M.D.   On: 11/27/2022 14:13               LOS: 4 days   Giah Fickett  [facility]   Pager on [email or web address]. If 7PM-7AM, please contact night-coverage at [email or web address]     11/28/2022, 2:46 PM

## 2022-11-28 NOTE — Progress Notes (Signed)
[facility] CLINIC CARDIOLOGY CONSULT NOTE       Patient ID: Leslie Parker MRN: [id number] DOB/AGE: Mar 23, 1933 89 y.o.  Admit date: 11/24/2022 Referring Physician Dr. Joylene Igo Primary Physician Dr. Nemiah Commander Primary Cardiologist Dr. Juliann Pares Reason for Consultation bradycardia  HPI: Leslie Parker. Hamade is an 34yoF with a PMH of sick sinus syndrome s/p recent [employer] DC PPM implant 11/15/2022, paroxysmal AF (eliquis), DM2, COPD, who presented to [facility] ED 11/24/2022 with sudden onset positional dizziness. She had recently seen cardiology in clinic on 5/3 for routine follow up and incision check after the pacemaker was placed. At that visit, her BP remained elevated and her home dose of cardizem was increased from 240mg  daily to 360mg  daily. She was initially bradycardic to the 50s, so BB and CCB were held with improvement in HR. IVF given for AKI with improvement.  PPM interrogation on 5/7 showed both atrial and ventricular leads with failed to capture, chest x-ray confirmed malposition/retraction of both atrial and ventricular leads in the SVC. The patient underwent pacemaker lead repositioning this morning without untoward event.  Interval History:  -S/p successful pacemaker lead repositioning this morning -Seen and examined back on 2A following this procedure, she offers no complaints and says she "felt the procedure was enjoyable."  -She has no chest pain, shortness of breath, heart racing or palpitations. -In atrial fibrillation on telemetry with rate in the low 100s to 110s.  Review of systems complete and found to be negative unless listed above     Past Medical History:  Diagnosis Date   Asthma    Atrial fibrillation with RVR (HCC)    Cancer (HCC)    Basal Cell   Diabetes mellitus without complication (HCC)    Heart murmur    Hypertension    Impetigo    Osteoarthritis    Osteopenia    Vomiting    can not due to surgery   Wears dentures    full upper and lower    Past  Surgical History:  Procedure Laterality Date   ABDOMINAL HYSTERECTOMY     BACK SURGERY     BLADDER SURGERY     mesh   CARPAL TUNNEL RELEASE Bilateral    CATARACT EXTRACTION Right 2017   CATARACT EXTRACTION W/PHACO Left 02/06/2016   Procedure: CATARACT EXTRACTION PHACO AND INTRAOCULAR LENS PLACEMENT (IOC) left eye;  Surgeon: Sherald Hess, MD;  Location: [facility];  Service: Ophthalmology;  Laterality: Left;  DIABETIC LEFT Cannot arrive before 9:30   CERVICAL DISC SURGERY     CHOLECYSTECTOMY     EYE SURGERY Bilateral    Cataract Extraction with IOL   FEMUR IM NAIL Left 02/04/2015   Procedure: INTRAMEDULLARY (IM) NAIL FEMORAL;  Surgeon: Juanell Fairly, MD;  Location: [facility] ORS;  Service: Orthopedics;  Laterality: Left;   HARDWARE REMOVAL Left 01/17/2017   Procedure: HARDWARE REMOVAL;  Surgeon: Juanell Fairly, MD;  Location: [facility] ORS;  Service: Orthopedics;  Laterality: Left;   HERNIA REPAIR  2014   esophageal and gastric mesh. patient unable to throw up d/t mesh   HIP ARTHROPLASTY Left 01/26/2017   Procedure: ARTHROPLASTY BIPOLAR HIP (HEMIARTHROPLASTY) removal hardware left hip;  Surgeon: Deeann Saint, MD;  Location: [facility] ORS;  Service: Orthopedics;  Laterality: Left;   JOINT REPLACEMENT Bilateral    knees   PACEMAKER IMPLANT N/A 11/15/2022   Procedure: PACEMAKER IMPLANT;  Surgeon: Marcina Millard, MD;  Location: [facility] INVASIVE [facility];  Service: Cardiovascular;  Laterality: N/A;   REPLACEMENT TOTAL KNEE BILATERAL  Bilateral [id number]   SHOULDER ARTHROSCOPY WITH OPEN ROTATOR CUFF REPAIR AND DISTAL CLAVICLE ACROMINECTOMY Left 10/25/2016   Procedure: SHOULDER ARTHROSCOPY WITH OPEN ROTATOR CUFF REPAIR AND DISTAL CLAVICLE ACROMINECTOMY;  Surgeon: Juanell Fairly, MD;  Location: [facility] ORS;  Service: Orthopedics;  Laterality: Left;   THYROID SURGERY     goiter removed   TOTAL HIP REVISION Left 12/02/2017   Procedure: TOTAL HIP REVISION;  Surgeon: Lyndle Herrlich, MD;   Location: [facility] ORS;  Service: Orthopedics;  Laterality: Left;   TOTAL SHOULDER REPLACEMENT Right 2012    Medications Prior to Admission  Medication Sig Dispense Refill Last Dose   albuterol (VENTOLIN HFA) 108 (90 Base) MCG/ACT inhaler Inhale 2 puffs into the lungs every 6 (six) hours as needed.   unk at unk   amiodarone (PACERONE) 200 MG tablet Take 200 mg by mouth daily.   11/24/2022   apixaban (ELIQUIS) 2.5 MG TABS tablet Take 1 tablet (2.5 mg total) by mouth 2 (two) times daily. 60 tablet 1 11/24/2022   ascorbic acid (VITAMIN C) 250 MG CHEW Chew 250 mg by mouth daily.   11/24/2022   [EXPIRED] cephALEXin (KEFLEX) 500 MG capsule Take 1 capsule (500 mg total) by mouth 2 (two) times daily for 10 days. 20 capsule 0 11/24/2022   colestipol (COLESTID) 1 g tablet Take 1 tablet by mouth 2 (two) times daily.   11/24/2022   Cyanocobalamin (B-12) 2500 MCG TABS Take 2,500 mcg by mouth daily.   11/24/2022   diltiazem (CARDIZEM CD) 240 MG 24 hr capsule Take 1 capsule (240 mg total) by mouth daily. 30 capsule 6 11/24/2022   furosemide (LASIX) 20 MG tablet Take 20 mg by mouth daily.   11/24/2022   glipiZIDE (GLUCOTROL XL) 2.5 MG 24 hr tablet Take 2.5 mg by mouth daily with breakfast.   11/24/2022   ipratropium-albuterol (DUONEB) 0.5-2.5 (3) MG/3ML SOLN Inhale 3 mLs into the lungs every 6 (six) hours as needed.   unk at unk   Magnesium Oxide 500 MG CAPS Take 500 mg by mouth daily.   11/24/2022   metoprolol succinate (TOPROL-XL) 25 MG 24 hr tablet Take 1 tablet (25 mg total) by mouth daily. 30 tablet 1 11/24/2022   Multiple Vitamin (MULTIVITAMIN WITH MINERALS) TABS tablet Take 1 tablet by mouth daily. One-A-Day Women's Vitamin   11/24/2022   traZODone (DESYREL) 100 MG tablet Take 100 mg by mouth at bedtime.   Past Week   TRELEGY ELLIPTA 100-62.5-25 MCG/ACT AEPB Inhale 1 puff into the lungs daily.   11/24/2022   Social History   Socioeconomic History   Marital status: Divorced    Spouse name: Not on file   Number of children: Not  on file   Years of education: Not on file   Highest education level: Not on file  Occupational History   Not on file  Tobacco Use   Smoking status: Never   Smokeless tobacco: Never  Vaping Use   Vaping Use: Never used  Substance and Sexual Activity   Alcohol use: No    Alcohol/week: 0.0 standard drinks of alcohol   Drug use: No   Sexual activity: Never  Other Topics Concern   Not on file  Social History Narrative   Not on file   Social Determinants of Health   Financial Resource Strain: Not on file  Food Insecurity: No Food Insecurity (11/25/2022)   Hunger Vital Sign    Worried About Running Out of Food in the Last Year: Never true  Ran Out of Food in the Last Year: Never true  Transportation Needs: No Transportation Needs (11/25/2022)   PRAPARE - Administrator, Civil Service (Medical): No    Lack of Transportation (Non-Medical): No  Physical Activity: Not on file  Stress: Not on file  Social Connections: Not on file  Intimate Partner Violence: Not At Risk (11/25/2022)   Humiliation, Afraid, Rape, and Kick questionnaire    Fear of Current or Ex-Partner: No    Emotionally Abused: No    Physically Abused: No    Sexually Abused: No    Family History  Problem Relation Age of Onset   Heart failure Mother    Hypertension Mother    Emphysema Father       Intake/Output Summary (Last 24 hours) at 11/28/2022 1017 Last data filed at 11/27/2022 2140 Gross per 24 hour  Intake 630 ml  Output --  Net 630 ml     Vitals:   11/28/22 0925 11/28/22 0930 11/28/22 0945 11/28/22 1000  BP: (!) 149/79 (!) 157/94 (!) 149/94 (!) 140/90  Pulse: (!) 102 100 98 (!) 104  Resp: (!) 21 (!) 21 16 16   Temp:      TempSrc:      SpO2: 95% 96% 96% 96%  Weight:      Height:        PHYSICAL EXAM General: pleasant elderly caucasian female , well nourished, in no acute distress.  Sitting upright in bed with granddaughter at bedside. HEENT:  Normocephalic and atraumatic. Neck:  No  JVD.  Chest: Dry Tegaderm dressing overlying left infraclavicular incision without bleeding, oozing, or significant surrounding ecchymosis. Lungs: Normal respiratory effort on room air. Clear bilaterally to auscultation. No wheezes, crackles, rhonchi.  Heart: HRRR . Normal S1 and S2 without gallops or murmurs.  Abdomen: Non-distended appearing.  Msk: Normal strength and tone for age.  Left arm in sling. Extremities: Warm and well perfused. No clubbing, cyanosis. No peripheral edema.  Neuro: Alert and oriented X 3. Psych:  Answers questions appropriately.   Labs: Basic Metabolic Panel: Recent Labs    11/27/22 0412 11/28/22 0426  NA 138 137  K 3.9 4.0  CL 109 109  CO2 21* 22  GLUCOSE 131* 109*  BUN 38* 33*  CREATININE 1.61* 1.55*  CALCIUM 8.3* 8.2*  MG 1.8  --     Liver Function Tests: No results for input(s): "AST", "ALT", "ALKPHOS", "BILITOT", "PROT", "ALBUMIN" in the last 72 hours. No results for input(s): "LIPASE", "AMYLASE" in the last 72 hours. CBC: Recent Labs    11/27/22 0412 11/28/22 0426  WBC 7.4 6.4  HGB 11.9* 11.4*  HCT 35.7* 33.1*  MCV 91.3 90.2  PLT 135* 125*    Cardiac Enzymes: No results for input(s): "CKTOTAL", "CKMB", "CKMBINDEX", "TROPONINIHS" in the last 72 hours.  BNP: No results for input(s): "BNP" in the last 72 hours. D-Dimer: No results for input(s): "DDIMER" in the last 72 hours. Hemoglobin A1C: No results for input(s): "HGBA1C" in the last 72 hours. Fasting Lipid Panel: No results for input(s): "CHOL", "HDL", "LDLCALC", "TRIG", "CHOLHDL", "LDLDIRECT" in the last 72 hours. Thyroid Function Tests: No results for input(s): "TSH", "T4TOTAL", "T3FREE", "THYROIDAB" in the last 72 hours.  Invalid input(s): "FREET3" Anemia Panel: No results for input(s): "VITAMINB12", "FOLATE", "FERRITIN", "TIBC", "IRON", "RETICCTPCT" in the last 72 hours.   Radiology: EP PPM/ICD IMPLANT  Result Date: 11/28/2022 Successful repositioning of atrial and  ventricular pacemaker leads   DG Chest 2 View  Result Date:  11/27/2022 CLINICAL DATA:  Status post pacemaker placement EXAM: CHEST - 2 VIEW COMPARISON:  X-ray 11/24/2022 FINDINGS: Enlarged heart. Calcified aorta. Left chest pacemaker. The leads however are retracted with tips overlying the left brachiocephalic vein in the central SVC above the heart. There is more portions of the wires overlapping the pacemaker battery pack. There is some linear opacity at the bases likely scar or atelectasis. No pneumothorax or edema. No consolidation. Overlapping cardiac leads. Right shoulder reverse arthroplasty. Critical Value/emergent results were called by telephone at the time of interpretation on 11/27/2022 at 11:10 am to provider Jodie Echevaria , who verbally acknowledged these results. IMPRESSION: Retracted pacemaker leads now with the tips overlying the brachiocephalic vein in the central SVC. Electronically Signed   By: Karen Kays M.D.   On: 11/27/2022 14:13   DG Chest Portable 1 View  Result Date: 11/24/2022 CLINICAL DATA:  Weakness and nausea EXAM: PORTABLE CHEST 1 VIEW COMPARISON:  11/15/2022 FINDINGS: Stable cardiomegaly. Aortic atherosclerotic calcification. Left chest wall pacemaker. Bibasilar atelectasis/scarring. No focal pneumonia, definite pleural effusion, or pneumothorax. Reverse right TSA. IMPRESSION: No active disease.  Cardiomegaly. Electronically Signed   By: Minerva Fester M.D.   On: 11/24/2022 21:48   DG Chest Port 1 View  Result Date: 11/15/2022 CLINICAL DATA:  Status post placement of pacemaker EXAM: PORTABLE CHEST 1 VIEW COMPARISON:  08/22/2022 FINDINGS: Transverse diameter of heart is increased. There are no signs of alveolar pulmonary edema. Linear densities are seen in medial left lower lung field. There is interval placement of pacemaker battery in the left infraclavicular region with tips of leads in right atrium and right ventricle. There is previous reverse arthroplasty right shoulder.  IMPRESSION: Interval placement of pacemaker battery in the left infraclavicular region. There is no pleural effusion or pneumothorax. Linear densities in medial left lower lung field may suggest scarring or subsegmental atelectasis. Electronically Signed   By: Ernie Avena M.D.   On: 11/15/2022 14:44   EP PPM/ICD IMPLANT  Result Date: 11/15/2022 Successful dual-chamber pacemaker generator implantation    ECHO 06/06/2022  1. Left ventricular ejection fraction, by estimation, is 60 to 65%. The  left ventricle has normal function. The left ventricle has no regional  wall motion abnormalities. There is mild left ventricular hypertrophy.  Left ventricular diastolic parameters  are indeterminate.   2. Right ventricular systolic function is normal. The right ventricular  size is normal.   3. Left atrial size was mildly dilated.   4. The mitral valve is degenerative. Mild mitral valve regurgitation.   5. Tricuspid valve regurgitation is mild to moderate.   6. The aortic valve is calcified. Aortic valve regurgitation is not  visualized. Mild aortic valve stenosis. Aortic valve mean gradient  measures 11.5 mmHg.   7. The inferior vena cava is normal in size with greater than 50%  respiratory variability, suggesting right atrial pressure of 3 mmHg.   TELEMETRY reviewed by me (LT) 11/28/2022 : AF 100-110  EKG reviewed by me: AF 100BPM  Data reviewed by me (LT) 11/28/2022:  hospitalist progress note, last 24h vitals tele labs imaging I/O    Principal Problem:   AKI (acute kidney injury) (HCC) Active Problems:   Hypertension   Sick sinus syndrome (HCC)   CKD stage 3 due to type 2 diabetes mellitus (HCC)   UTI (urinary tract infection)   Bradycardia   Paroxysmal atrial fibrillation (HCC)    ASSESSMENT AND PLAN:  Abrielle Goldammer. Granade is an 77yoF with a PMH of sick sinus  syndrome s/p recent [employer] DC PPM implant 11/15/2022, paroxysmal AF (eliquis), DM2, COPD, who presented to [facility] ED  11/24/2022 with sudden onset positional dizziness. She had recently seen cardiology in clinic on 5/3 for routine follow up and incision check after the pacemaker was placed. At that visit, her BP remained elevated and her home dose of cardizem was increased from 240mg  daily to 360mg  daily. She was initially bradycardic to the 50s, so BB and CCB were held with improvement in HR. IVF given for AKI with improvement.  PPM interrogation on 5/7 showed both atrial and ventricular leads with failed to capture, chest x-ray confirmed malposition/retraction of both atrial and ventricular leads in the SVC. The patient underwent pacemaker lead repositioning this morning without untoward event.  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 On tele today noted dyssynchrony with pacemaker spikes & QRS complexes + patient having abdominal spasms. Bedside device interrogation 5/7 showed failure to capture for both atrial and ventricular lead, likely indicating both leads have been dislodged, confirmed on CXR showing both leads retracted with tips overlying the brachiocephalic vein in the central SVC.  Now s/p pacemaker lead repositioning this morning which the patient tolerated well. -Restart cardizem at 90 mg twice daily for rate control for now +  metoprolol.  Likely consolidate to CD dosing tomorrow if heart rate is well-controlled. -Likely eliquis 2.5mg  BID tomorrow morning - Reinforced importance for left arm precautions and additional postprocedure instructions and her granddaughter -Will start Keflex 500 mg twice daily x 10 days starting tomorrow for skin prophylaxis. -Repeat chest x-ray pending radiology read -Anticipate discharge readiness from a  cardiac perspective tomorrow morning  This patient's plan of care was discussed and created with Dr. Darrold Junker and he is in agreement.  Signed: Rebeca Allegra , PA-C 11/28/2022, 10:17 AM [facility]

## 2022-11-28 NOTE — Progress Notes (Addendum)
OT Cancellation Note  Patient Details Name: Leslie Parker MRN: [id number] DOB: [birth year]   Cancelled Treatment:    Reason Eval/Treat Not Completed: Patient at procedure or test/ unavailable. Other (comment).  7829: Pt currently off the floor at procedure. Will re-attempt as able.  1119: Pt declined therapy at this time 2/2 just getting back from procedure and wanting to visit with granddaughter. Will re-attempt at later date/time.  Dorene Grebe  [facility] 11/28/2022, 9:16 AM

## 2022-11-28 NOTE — Progress Notes (Signed)
BP elevated, pt asymptomatic - gave scheduled imdur and PO hydralazine. BP still 209/89 and pt reports gradual development of headache to forehead area like last night. Denies CP/SOB or dizziness. Tylenol given and PRN IV hydralazine 5mg  given and BP gradually decreased to 154/74 and pt reports headache resolved.    11/27/22 2043  Assess: MEWS Score  Temp 98.2 F (36.8 C)  BP (!) 209/88 (imdur given)  MAP (mmHg) 123  Pulse Rate 73  Resp 20  Level of Consciousness Alert  SpO2 97 %  O2 Device Room Air  Assess: MEWS Score  MEWS Temp 0  MEWS Systolic 2  MEWS Pulse 0  MEWS RR 0  MEWS LOC 0  MEWS Score 2  MEWS Score Color Yellow  Assess: if the MEWS score is Yellow or Red  Were vital signs taken at a resting state? Yes  Focused Assessment No change from prior assessment  Does the patient meet 2 or more of the SIRS criteria? No  MEWS guidelines implemented  Yes, yellow  Treat  MEWS Interventions Considered administering scheduled or prn medications/treatments as ordered  Take Vital Signs  Increase Vital Sign Frequency  Yellow: Q2hr x1, continue Q4hrs until patient remains green for 12hrs  Escalate  MEWS: Escalate Yellow: Discuss with charge nurse and consider notifying provider and/or RRT  Notify: Charge Nurse/RN  Name of Charge Nurse/RN Notified Tax adviser  Provider Notification  Provider Name/Title K. Foust, NP  Date Provider Notified 11/27/22  Time Provider Notified 2139  Method of Notification  (secure chat)  Notification Reason Other (Comment) (yellow mews, BP 216/97 just gave her 25mg  scheduled hydralazine and I gave her (IMDUR) 24 hr tablet 30 mg at 2044)  Provider response No new orders  Assess: SIRS CRITERIA  SIRS Temperature  0  SIRS Pulse 0  SIRS Respirations  0  SIRS WBC 0  SIRS Score Sum  0

## 2022-11-28 NOTE — Progress Notes (Signed)
PT Cancellation Note  Patient Details Name: HEMMA CANEL MRN: [id number] DOB: July 14, 1933   Cancelled Treatment:    Reason Eval/Treat Not Completed: Other (comment).Pt declined PT at this time, requesting to rest.    Olga Coaster PT, DPT 2:07 PM,11/28/22

## 2022-11-28 NOTE — Consult Note (Signed)
[facility] Customer service manager [facility]) Accountable Care Organization (ACO) [facility] Liaison Note  11/28/2022  [name] 1933-01-02 [id number]  Location: [facility] RN Hospital Liaison met patient at bedside at [facility].  Insurance: Leslie Parker is a 89 y.o. female who is a Primary Care Patient of [name], MD [facility]). The patient was screened for 30 day readmission hospitalization with noted high risk score for unplanned readmission risk with 4 IP in 6 months.  The patient was assessed for potential [facility]) Care Management service needs for post hospital transition for care coordination. Review of patient's electronic medical record reveals patient  was admitted with AKI. [facility] liaison visited pt at bedside and educated on [facility] services and benefits. Pt receptive to post hospital follow up call for [facility] care coordinator. Pt states she recent had pacemaker placed (verified on 11/15/2022).  Patient was given an appointment reminder card and 24 hour Nurse Advice Line magnet.   Plan: [facility] Liaison will continue to follow progress and disposition to asess for post hospital community care coordination/management needs.  Referral request for community care coordination: pending disposition.   [facility] Care Management/Population Health does not replace or interfere with any arrangements made by the Inpatient Transition of Care team.   For questions contact:   Elliot Cousin, RN, BSN [facility] Liaison [place]   [facility]  Population Health Office Hours MTWF 8:00 am to 6 pm off on Thursday [phone] mobile [phone] [Office toll free line][facility] Office Hours are M-F 8:30 - 5 pm 24 hour nurse advise line [phone] Conceirge  [email or web address]

## 2022-11-29 DIAGNOSIS — N179 Acute kidney failure, unspecified: Secondary | ICD-10-CM | POA: Diagnosis not present

## 2022-11-29 DIAGNOSIS — R001 Bradycardia, unspecified: Secondary | ICD-10-CM | POA: Diagnosis not present

## 2022-11-29 DIAGNOSIS — T82110A Breakdown (mechanical) of cardiac electrode, initial encounter: Secondary | ICD-10-CM | POA: Diagnosis not present

## 2022-11-29 LAB — CBC WITH DIFFERENTIAL/PLATELET
Abs Immature Granulocytes: 0.02 10*3/uL (ref 0.00–0.07)
Basophils Absolute: 0 10*3/uL (ref 0.0–0.1)
Basophils Relative: 1 %
Eosinophils Absolute: 0.4 10*3/uL (ref 0.0–0.5)
Eosinophils Relative: 7 %
HCT: 34.9 % — ABNORMAL LOW (ref 36.0–46.0)
Hemoglobin: 11.7 g/dL — ABNORMAL LOW (ref 12.0–15.0)
Immature Granulocytes: 0 %
Lymphocytes Relative: 30 %
Lymphs Abs: 1.7 10*3/uL (ref 0.7–4.0)
MCH: 30.5 pg (ref 26.0–34.0)
MCHC: 33.5 g/dL (ref 30.0–36.0)
MCV: 90.9 fL (ref 80.0–100.0)
Monocytes Absolute: 0.6 10*3/uL (ref 0.1–1.0)
Monocytes Relative: 10 %
Neutro Abs: 2.9 10*3/uL (ref 1.7–7.7)
Neutrophils Relative %: 52 %
Platelets: 130 10*3/uL — ABNORMAL LOW (ref 150–400)
RBC: 3.84 MIL/uL — ABNORMAL LOW (ref 3.87–5.11)
RDW: 13.3 % (ref 11.5–15.5)
WBC: 5.5 10*3/uL (ref 4.0–10.5)
nRBC: 0 % (ref 0.0–0.2)

## 2022-11-29 LAB — BASIC METABOLIC PANEL
Anion gap: 7 (ref 5–15)
BUN: 36 mg/dL — ABNORMAL HIGH (ref 8–23)
CO2: 21 mmol/L — ABNORMAL LOW (ref 22–32)
Calcium: 8.1 mg/dL — ABNORMAL LOW (ref 8.9–10.3)
Chloride: 108 mmol/L (ref 98–111)
Creatinine, Ser: 1.65 mg/dL — ABNORMAL HIGH (ref 0.44–1.00)
GFR, Estimated: 30 mL/min — ABNORMAL LOW (ref >60)
Glucose, Bld: 127 mg/dL — ABNORMAL HIGH (ref 70–99)
Potassium: 4 mmol/L (ref 3.5–5.1)
Sodium: 136 mmol/L (ref 135–145)

## 2022-11-29 LAB — GLUCOSE, CAPILLARY
Glucose-Capillary: 119 mg/dL — ABNORMAL HIGH (ref 70–99)
Glucose-Capillary: 138 mg/dL — ABNORMAL HIGH (ref 70–99)

## 2022-11-29 MED ORDER — DILTIAZEM HCL ER COATED BEADS 180 MG PO CP24
300.0000 mg | ORAL_CAPSULE | Freq: Every day | ORAL | Status: DC
  Administered 2022-11-29: 300 mg via ORAL
  Filled 2022-11-29: qty 1

## 2022-11-29 MED ORDER — DILTIAZEM HCL ER COATED BEADS 300 MG PO CP24: 300.0000 mg | ORAL_CAPSULE | Freq: Every day | ORAL | 0 refills | Status: DC

## 2022-11-29 MED ORDER — CEPHALEXIN 500 MG PO CAPS: 500.0000 mg | ORAL_CAPSULE | Freq: Two times a day (BID) | ORAL | 0 refills | Status: DC

## 2022-11-29 MED ORDER — APIXABAN 2.5 MG PO TABS
2.5000 mg | ORAL_TABLET | Freq: Two times a day (BID) | ORAL | Status: DC
  Administered 2022-11-29: 2.5 mg via ORAL
  Filled 2022-11-29: qty 1

## 2022-11-29 MED ORDER — CEPHALEXIN 500 MG PO CAPS
500.0000 mg | ORAL_CAPSULE | Freq: Two times a day (BID) | ORAL | Status: DC
  Administered 2022-11-29: 500 mg via ORAL
  Filled 2022-11-29: qty 1

## 2022-11-29 NOTE — Progress Notes (Signed)
Discussed discharge instruction with patient including medications, follow appointments and left upper extremity limitations/monitoring for surgical site infection.

## 2022-11-29 NOTE — Discharge Instructions (Signed)
Please keep your left arm in a sling until you see Dr. Juliann Pares in the office. For 6 weeks, avoid lifting greater than 15 pounds or raising your left arm above your head. Please do not shower until tomorrow and do not submerge your left chest in water (no baths, swimming) for at least 1 week or until you follow up with Dr. Juliann Pares. You can remove the clear bandage on your left chest if it starts to peel off, but please do NOT take off the steri strips underneath (thin rectangular strips). Take all your medicines as prescribed, including the antibiotic I have prescribed called Keflex (cefalexin). Please call Dr. Juliann Pares 's office at [facility] [phone]) or if you have any questions or concerns.

## 2022-11-29 NOTE — Progress Notes (Addendum)
Patient complained of 7/10 chest/epigastric discomfort first thing this morning.   Morphine given and repositioned patient to fowler's position.

## 2022-11-29 NOTE — Discharge Summary (Signed)
Physician Discharge Summary   Patient: Leslie Parker MRN: [id number] DOB: Nov 23, 1932  Admit date:     11/24/2022  Discharge date: 11/29/22  Discharge Physician: Lurene Shadow   PCP: [name], MD   Recommendations at discharge:   Follow-up with Dr. Juliann Pares, cardiologist, in 1 week Follow-up with PCP in 1 to 2 weeks  Discharge Diagnoses: Principal Problem:   AKI (acute kidney injury) (HCC) Active Problems:   Bradycardia   Hypertension   Sick sinus syndrome (HCC)   CKD stage 3 due to type 2 diabetes mellitus (HCC)   UTI (urinary tract infection)   Paroxysmal atrial fibrillation (HCC)   Pacemaker lead malfunction  Resolved Problems:   * No resolved hospital problems. [facility]:  Ms. Leslie Parker is a 89 y.o. female with medical history significant for recent pacemaker placement on 11/15/2022 for sick sinus syndrome, COPD, asthma, type II DM, CKD stage IIIb, hypertension, atrial fibrillation on Eliquis, chronic diastolic CHF, osteoarthritis, history of left hip hemiarthroplasty, diaphragmatic hernia.  She presented to the emergency department because of dizziness and general weakness.  She was recently started on metoprolol and her Cardizem was increased from 240 to 360 mg daily.   She was noted to have heart rate in the 50s and elevated creatinine of 2.44 which is above her baseline of around 1.6.  She was admitted to the hospital for acute kidney injury and bradycardia.  She was recently had a pacemaker placed on 11/15/2022 and was discovered that her pacemaker was not sensing properly and her pacemaker leads were dislodged.  She was seen in consultation by cardiologist and she underwent successful repositioning of atrial and ventricular pacemaker leads.  Assessment and Plan:   AKI on CKD stage IIIb: Improved.       Bradycardia, dislodged pacemaker lead and failure to capture: S/p successful repositioning of atrial and ventricular pacemaker leads on  11/28/2022. Cardiologist recommended Keflex for skin infection prophylaxis because of recent repositioning of pacemaker leads.   Hypertensive urgency: BP is better.  Continue Cardizem, hydralazine and metoprolol.  Follow-up with PCP for further management.     Paroxysmal atrial fibrillation: Continue Eliquis, amiodarone, metoprolol and Cardizem     Suspected acute UTI: Completed antibiotics for UTI.  No significant growth on urine culture.     Other comorbidities include chronic diastolic CHF, COPD, asthma, type II DM    She feels better and she is deemed stable for discharge to home today.       Consultants: Cardiologist Procedures performed: Repositioning of atrial and ventricular pacemaker leads Disposition: Home Diet recommendation:  Discharge Diet Orders (From admission, onward)     Start     Ordered   11/29/22 0000  Diet - low sodium heart healthy        11/29/22 1144   11/29/22 0000  Diet Carb Modified        11/29/22 1144           Cardiac and Carb modified diet DISCHARGE MEDICATION: Allergies as of 11/29/2022       Reactions   Baclofen Other (See Comments)   Elevated Cr        Medication List     STOP taking these medications    cephALEXin 500 MG capsule Commonly known as: Keflex       TAKE these medications    albuterol 108 (90 Base) MCG/ACT inhaler Commonly known as: VENTOLIN HFA Inhale 2 puffs into the lungs every 6 (six) hours as needed.  amiodarone 200 MG tablet Commonly known as: PACERONE Take 200 mg by mouth daily.   apixaban 2.5 MG Tabs tablet Commonly known as: ELIQUIS Take 1 tablet (2.5 mg total) by mouth 2 (two) times daily.   ascorbic acid 250 MG Chew Commonly known as: VITAMIN C Chew 250 mg by mouth daily.   B-12 2500 MCG Tabs Take 2,500 mcg by mouth daily.   colestipol 1 g tablet Commonly known as: COLESTID Take 1 tablet by mouth 2 (two) times daily.   diltiazem 300 MG 24 hr capsule Commonly known as: CARDIZEM  CD Take 1 capsule (300 mg total) by mouth daily. What changed:  medication strength how much to take   furosemide 20 MG tablet Commonly known as: LASIX Take 20 mg by mouth daily.   glipiZIDE 2.5 MG 24 hr tablet Commonly known as: GLUCOTROL XL Take 2.5 mg by mouth daily with breakfast.   ipratropium-albuterol 0.5-2.5 (3) MG/3ML Soln Commonly known as: DUONEB Inhale 3 mLs into the lungs every 6 (six) hours as needed.   Magnesium Oxide -Mg Supplement 500 MG Caps Take 500 mg by mouth daily.   metoprolol succinate 25 MG 24 hr tablet Commonly known as: TOPROL-XL Take 1 tablet (25 mg total) by mouth daily.   multivitamin with minerals Tabs tablet Take 1 tablet by mouth daily. One-A-Day Women's Vitamin   traZODone 100 MG tablet Commonly known as: DESYREL Take 100 mg by mouth at bedtime.   Trelegy Ellipta 100-62.5-25 MCG/ACT Aepb Generic drug: Fluticasone-Umeclidin-Vilant Inhale 1 puff into the lungs daily.        Follow-up Information     Alwyn Pea, MD Follow up.   Specialties: Cardiology, Internal Medicine Contact information: [address] [place] [address] [phone]                Discharge Exam: Leslie Parker Weights   11/24/22 2120  Weight: 89 kg   GEN: NAD SKIN: Warm and dry EYES: No pallor or icterus ENT: MMM CV: RRR PULM: CTA B ABD: soft, ND, NT, +BS CNS: AAO x 3, non focal EXT: No edema or tenderness   Condition at discharge: good  The results of significant diagnostics from this hospitalization (including imaging, microbiology, ancillary and laboratory) are listed below for reference.   Imaging Studies: DG Chest Port 1 View  Result Date: 11/28/2022 CLINICAL DATA:  Pacemaker placement EXAM: PORTABLE CHEST 1 VIEW COMPARISON:  Portable exam 1353 hours compared to 11/27/2022 FINDINGS: LEFT subclavian transvenous pacemaker with leads projecting over RIGHT atrium and RIGHT ventricle. Upper normal size of cardiac silhouette.  Mediastinal contours and pulmonary vascularity normal. Atherosclerotic calcification aorta. Lungs clear. No pulmonary infiltrate, pleural effusion, or pneumothorax. Osseous demineralization with RIGHT shoulder prosthesis. IMPRESSION: No pneumothorax following pacemaker placement. Aortic Atherosclerosis (ICD10-I70.0). Electronically Signed   By: Ulyses Southward M.D.   On: 11/28/2022 15:57   EP PPM/ICD IMPLANT  Result Date: 11/28/2022 Successful repositioning of atrial and ventricular pacemaker leads   DG Chest 2 View  Result Date: 11/27/2022 CLINICAL DATA:  Status post pacemaker placement EXAM: CHEST - 2 VIEW COMPARISON:  X-ray 11/24/2022 FINDINGS: Enlarged heart. Calcified aorta. Left chest pacemaker. The leads however are retracted with tips overlying the left brachiocephalic vein in the central SVC above the heart. There is more portions of the wires overlapping the pacemaker battery pack. There is some linear opacity at the bases likely scar or atelectasis. No pneumothorax or edema. No consolidation. Overlapping cardiac leads. Right shoulder reverse arthroplasty. Critical Value/emergent results were  called by telephone at the time of interpretation on 11/27/2022 at 11:10 am to provider Jodie Echevaria , who verbally acknowledged these results. IMPRESSION: Retracted pacemaker leads now with the tips overlying the brachiocephalic vein in the central SVC. Electronically Signed   By: Karen Kays M.D.   On: 11/27/2022 14:13   DG Chest Portable 1 View  Result Date: 11/24/2022 CLINICAL DATA:  Weakness and nausea EXAM: PORTABLE CHEST 1 VIEW COMPARISON:  11/15/2022 FINDINGS: Stable cardiomegaly. Aortic atherosclerotic calcification. Left chest wall pacemaker. Bibasilar atelectasis/scarring. No focal pneumonia, definite pleural effusion, or pneumothorax. Reverse right TSA. IMPRESSION: No active disease.  Cardiomegaly. Electronically Signed   By: Minerva Fester M.D.   On: 11/24/2022 21:48   DG Chest Port 1 View  Result  Date: 11/15/2022 CLINICAL DATA:  Status post placement of pacemaker EXAM: PORTABLE CHEST 1 VIEW COMPARISON:  08/22/2022 FINDINGS: Transverse diameter of heart is increased. There are no signs of alveolar pulmonary edema. Linear densities are seen in medial left lower lung field. There is interval placement of pacemaker battery in the left infraclavicular region with tips of leads in right atrium and right ventricle. There is previous reverse arthroplasty right shoulder. IMPRESSION: Interval placement of pacemaker battery in the left infraclavicular region. There is no pleural effusion or pneumothorax. Linear densities in medial left lower lung field may suggest scarring or subsegmental atelectasis. Electronically Signed   By: Ernie Avena M.D.   On: 11/15/2022 14:44   EP PPM/ICD IMPLANT  Result Date: 11/15/2022 Successful dual-chamber pacemaker generator implantation    Microbiology: Results for orders placed or performed during the hospital encounter of 11/24/22  SARS Coronavirus 2 by RT PCR (hospital order, performed in [facility] hospital lab) *cepheid single result test* Anterior Nasal Swab     Status: None   Collection Time: 11/24/22  9:24 PM   Specimen: Anterior Nasal Swab  Result Value Ref Range Status   SARS Coronavirus 2 by RT PCR NEGATIVE NEGATIVE Final    Comment: (NOTE) SARS-CoV-2 target nucleic acids are NOT DETECTED.  The SARS-CoV-2 RNA is generally detectable in upper and lower respiratory specimens during the acute phase of infection. The lowest concentration of SARS-CoV-2 viral copies this assay can detect is 250 copies / mL. A negative result does not preclude SARS-CoV-2 infection and should not be used as the sole basis for treatment or other patient management decisions.  A negative result may occur with improper specimen collection / handling, submission of specimen other than nasopharyngeal swab, presence of viral mutation(s) within the areas targeted by this  assay, and inadequate number of viral copies (<250 copies / mL). A negative result must be combined with clinical observations, patient history, and epidemiological information.  Fact Sheet for Patients:   RoadLapTop.co.za  Fact Sheet for Healthcare Providers: http://kim-miller.com/  This test is not yet approved or  cleared by the [place] FDA and has been authorized for detection and/or diagnosis of SARS-CoV-2 by FDA under an Emergency Use Authorization (EUA).  This EUA will remain in effect (meaning this test can be used) for the duration of the COVID-19 declaration under Section 564(b)(1) of the Act, 21 U.S.C. section 360bbb-3(b)(1), unless the authorization is terminated or revoked sooner.  Performed at [facility], [address] [place], [place] [address]   Urine Culture     Status: Abnormal   Collection Time: 11/25/22  6:16 PM   Specimen: Urine, Random  Result Value Ref Range Status   Specimen Description   Final  URINE, RANDOM Performed at [facility], [address], [place], [place] [address]    Special Requests   Final    NONE Performed at [facility], [address] [place], [place] [address]    Culture (A)  Final    <10,000 COLONIES/mL INSIGNIFICANT GROWTH Performed at [facility], [address] [place], [place] [address]    Report Status 11/26/2022 FINAL  Final  Surgical PCR screen     Status: None   Collection Time: 11/27/22  8:50 PM   Specimen: Nasal Mucosa; Nasal Swab  Result Value Ref Range Status   MRSA, PCR NEGATIVE NEGATIVE Final   Staphylococcus aureus NEGATIVE NEGATIVE Final    Comment: (NOTE) The Xpert SA Assay (FDA approved for NASAL specimens in patients 60 years of age and older), is one component of a comprehensive surveillance program. It is not intended to diagnose infection nor to guide or monitor treatment. Performed at [facility],  [address] [place], [place] [address]     Labs: CBC: Recent Labs  Lab 11/24/22 2124 11/25/22 0743 11/26/22 0500 11/27/22 0412 11/28/22 0426 11/29/22 0329  WBC 8.1 6.8 5.4 7.4 6.4 5.5  NEUTROABS 5.2  --   --   --   --  2.9  HGB 13.0 12.3 11.5* 11.9* 11.4* 11.7*  HCT 38.6 37.0 34.5* 35.7* 33.1* 34.9*  MCV 91.3 90.9 91.5 91.3 90.2 90.9  PLT 156 136* 123* 135* 125* 130*   Basic Metabolic Panel: Recent Labs  Lab 11/25/22 0743 11/26/22 0500 11/27/22 0412 11/28/22 0426 11/29/22 0329  NA 137 138 138 137 136  K 3.8 4.4 3.9 4.0 4.0  CL 106 109 109 109 108  CO2 25 19* 21* 22 21*  GLUCOSE 119* 107* 131* 109* 127*  BUN 47* 41* 38* 33* 36*  CREATININE 2.13* 1.91* 1.61* 1.55* 1.65*  CALCIUM 8.4* 8.1* 8.3* 8.2* 8.1*  MG  --   --  1.8  --   --    Liver Function Tests: No results for input(s): "AST", "ALT", "ALKPHOS", "BILITOT", "PROT", "ALBUMIN" in the last 168 hours. CBG: Recent Labs  Lab 11/28/22 1059 11/28/22 1625 11/28/22 2142 11/29/22 0834 11/29/22 1135  GLUCAP 126* 148* 102* 119* 138*    Discharge time spent: greater than 30 minutes.  Signed: Lurene Shadow, MD [facility] 11/29/2022

## 2022-11-29 NOTE — Progress Notes (Signed)
[facility] CLINIC CARDIOLOGY CONSULT NOTE       Patient ID: CALLOWAY Parker MRN: [id number] DOB/AGE: 1933/03/01 89 y.o.  Admit date: 11/24/2022 Referring Physician Dr. Joylene Igo Primary Physician Dr. Nemiah Commander Primary Cardiologist Dr. Juliann Pares Reason for Consultation bradycardia  HPI: Leslie Parker. [name] is an 73yoF with a PMH of sick sinus syndrome s/p recent [employer] DC PPM implant 11/15/2022, paroxysmal AF (eliquis), DM2, COPD, who presented to [facility] ED 11/24/2022 with sudden onset positional dizziness. She had recently seen cardiology in clinic on 5/3 for routine follow up and incision check after the pacemaker was placed. At that visit, her BP remained elevated and her home dose of cardizem was increased from 240mg  daily to 360mg  daily. She was initially bradycardic to the 50s, so BB and CCB were held with improvement in HR. IVF given for AKI with improvement.  PPM interrogation on 5/7 showed both atrial and ventricular leads with failed to capture, chest x-ray confirmed malposition/retraction of both atrial and ventricular leads in the SVC. The patient underwent pacemaker lead repositioning this morning without untoward event.  Interval History:  -Feels great today and is eager to go home -Denies chest pain, shortness of breath -Mild soreness at the pacemaker site, dry Tegaderm and bandage still clean dry and intact without oozing or discharge. -PPM interrogation this morning showed appropriate pacemaker function, chest x-ray without pneumothorax -In sinus rhythm on telemetry   Review of systems complete and found to be negative unless listed above     Past Medical History:  Diagnosis Date   Asthma    Atrial fibrillation with RVR (HCC)    Cancer (HCC)    Basal Cell   Diabetes mellitus without complication (HCC)    Heart murmur    Hypertension    Impetigo    Osteoarthritis    Osteopenia    Vomiting    can not due to surgery   Wears dentures    full upper and lower    Past  Surgical History:  Procedure Laterality Date   ABDOMINAL HYSTERECTOMY     BACK SURGERY     BLADDER SURGERY     mesh   CARPAL TUNNEL RELEASE Bilateral    CATARACT EXTRACTION Right 2017   CATARACT EXTRACTION W/PHACO Left 02/06/2016   Procedure: CATARACT EXTRACTION PHACO AND INTRAOCULAR LENS PLACEMENT (IOC) left eye;  Surgeon: Sherald Hess, MD;  Location: [facility];  Service: Ophthalmology;  Laterality: Left;  DIABETIC LEFT Cannot arrive before 9:30   CERVICAL DISC SURGERY     CHOLECYSTECTOMY     EYE SURGERY Bilateral    Cataract Extraction with IOL   FEMUR IM NAIL Left 02/04/2015   Procedure: INTRAMEDULLARY (IM) NAIL FEMORAL;  Surgeon: Juanell Fairly, MD;  Location: [facility];  Service: Orthopedics;  Laterality: Left;   HARDWARE REMOVAL Left 01/17/2017   Procedure: HARDWARE REMOVAL;  Surgeon: Juanell Fairly, MD;  Location: [facility];  Service: Orthopedics;  Laterality: Left;   HERNIA REPAIR  2014   esophageal and gastric mesh. patient unable to throw up d/t mesh   HIP ARTHROPLASTY Left 01/26/2017   Procedure: ARTHROPLASTY BIPOLAR HIP (HEMIARTHROPLASTY) removal hardware left hip;  Surgeon: Deeann Saint, MD;  Location: [facility];  Service: Orthopedics;  Laterality: Left;   JOINT REPLACEMENT Bilateral    knees   PACEMAKER IMPLANT N/A 11/15/2022   Procedure: PACEMAKER IMPLANT;  Surgeon: Marcina Millard, MD;  Location: [facility];  Service: Cardiovascular;  Laterality: N/A;   PACEMAKER IMPLANT N/A 11/28/2022  Procedure: PACEMAKER IMPLANT;  Surgeon: Marcina Millard, MD;  Location: [facility];  Service: Cardiovascular;  Laterality: N/A;  Lead reposition   REPLACEMENT TOTAL KNEE BILATERAL Bilateral [id number]   SHOULDER ARTHROSCOPY WITH OPEN ROTATOR CUFF REPAIR AND DISTAL CLAVICLE ACROMINECTOMY Left 10/25/2016   Procedure: SHOULDER ARTHROSCOPY WITH OPEN ROTATOR CUFF REPAIR AND DISTAL CLAVICLE ACROMINECTOMY;  Surgeon: Juanell Fairly, MD;  Location:  [facility];  Service: Orthopedics;  Laterality: Left;   THYROID SURGERY     goiter removed   TOTAL HIP REVISION Left 12/02/2017   Procedure: TOTAL HIP REVISION;  Surgeon: Lyndle Herrlich, MD;  Location: [facility];  Service: Orthopedics;  Laterality: Left;   TOTAL SHOULDER REPLACEMENT Right 2012    Medications Prior to Admission  Medication Sig Dispense Refill Last Dose   albuterol (VENTOLIN HFA) 108 (90 Base) MCG/ACT inhaler Inhale 2 puffs into the lungs every 6 (six) hours as needed.   unk at unk   amiodarone (PACERONE) 200 MG tablet Take 200 mg by mouth daily.   11/24/2022   apixaban (ELIQUIS) 2.5 MG TABS tablet Take 1 tablet (2.5 mg total) by mouth 2 (two) times daily. 60 tablet 1 11/24/2022   ascorbic acid (VITAMIN C) 250 MG CHEW Chew 250 mg by mouth daily.   11/24/2022   [EXPIRED] cephALEXin (KEFLEX) 500 MG capsule Take 1 capsule (500 mg total) by mouth 2 (two) times daily for 10 days. 20 capsule 0 11/24/2022   colestipol (COLESTID) 1 g tablet Take 1 tablet by mouth 2 (two) times daily.   11/24/2022   Cyanocobalamin (B-12) 2500 MCG TABS Take 2,500 mcg by mouth daily.   11/24/2022   furosemide (LASIX) 20 MG tablet Take 20 mg by mouth daily.   11/24/2022   glipiZIDE (GLUCOTROL XL) 2.5 MG 24 hr tablet Take 2.5 mg by mouth daily with breakfast.   11/24/2022   ipratropium-albuterol (DUONEB) 0.5-2.5 (3) MG/3ML SOLN Inhale 3 mLs into the lungs every 6 (six) hours as needed.   unk at unk   Magnesium Oxide 500 MG CAPS Take 500 mg by mouth daily.   11/24/2022   metoprolol succinate (TOPROL-XL) 25 MG 24 hr tablet Take 1 tablet (25 mg total) by mouth daily. 30 tablet 1 11/24/2022   Multiple Vitamin (MULTIVITAMIN WITH MINERALS) TABS tablet Take 1 tablet by mouth daily. One-A-Day Women's Vitamin   11/24/2022   traZODone (DESYREL) 100 MG tablet Take 100 mg by mouth at bedtime.   Past Week   TRELEGY ELLIPTA 100-62.5-25 MCG/ACT AEPB Inhale 1 puff into the lungs daily.   11/24/2022   [DISCONTINUED] diltiazem (CARDIZEM CD) 240 MG 24  hr capsule Take 1 capsule (240 mg total) by mouth daily. 30 capsule 6 11/24/2022   Social History   Socioeconomic History   Marital status: Divorced    Spouse name: Not on file   Number of children: Not on file   Years of education: Not on file   Highest education level: Not on file  Occupational History   Not on file  Tobacco Use   Smoking status: Never   Smokeless tobacco: Never  Vaping Use   Vaping Use: Never used  Substance and Sexual Activity   Alcohol use: No    Alcohol/week: 0.0 standard drinks of alcohol   Drug use: No   Sexual activity: Never  Other Topics Concern   Not on file  Social History Narrative   Not on file   Social Determinants of Health   Financial Resource Strain: Not on  file  Food Insecurity: No Food Insecurity (11/25/2022)   Hunger Vital Sign    Worried About Running Out of Food in the Last Year: Never true    Ran Out of Food in the Last Year: Never true  Transportation Needs: No Transportation Needs (11/25/2022)   PRAPARE - Administrator, Civil Service (Medical): No    Lack of Transportation (Non-Medical): No  Physical Activity: Not on file  Stress: Not on file  Social Connections: Not on file  Intimate Partner Violence: Not At Risk (11/25/2022)   Humiliation, Afraid, Rape, and Kick questionnaire    Fear of Current or Ex-Partner: No    Emotionally Abused: No    Physically Abused: No    Sexually Abused: No    Family History  Problem Relation Age of Onset   Heart failure Mother    Hypertension Mother    Emphysema Father       Intake/Output Summary (Last 24 hours) at 11/29/2022 1326 Last data filed at 11/29/2022 1046 Gross per 24 hour  Intake 200 ml  Output --  Net 200 ml    Vitals:   11/29/22 0800 11/29/22 1057 11/29/22 1132 11/29/22 1156  BP: (!) 184/79 (!) 162/86 (!) 158/77 105/67  Pulse: 80 72 72 100  Resp: 16  18 18   Temp: 98.6 F (37 C) 97.9 F (36.6 C) 98.3 F (36.8 C) 97.9 F (36.6 C)  TempSrc: Oral Oral Oral Oral   SpO2: 97% 97% 98% 96%  Weight:      Height:        PHYSICAL EXAM General: pleasant elderly caucasian female , well nourished, in no acute distress.  Sitting upright in bed  HEENT:  Normocephalic and atraumatic. Neck:  No JVD.  Chest: Dry Tegaderm dressing overlying left infraclavicular incision without bleeding, oozing.  Trace ecchymosis to the lateral edge of the bandage.   Lungs: Normal respiratory effort on room air. Clear bilaterally to auscultation. No wheezes, crackles, rhonchi.  Heart: HRRR . Normal S1 and S2 without gallops or murmurs.  Abdomen: Non-distended appearing.  Msk: Normal strength and tone for age.  Left arm in sling. Extremities: Warm and well perfused. No clubbing, cyanosis.  Bilateral lower extremity peripheral edema.  Neuro: Alert and oriented X 3. Psych:  Answers questions appropriately.   Labs: Basic Metabolic Panel: Recent Labs    11/27/22 0412 11/28/22 0426 11/29/22 0329  NA 138 137 136  K 3.9 4.0 4.0  CL 109 109 108  CO2 21* 22 21*  GLUCOSE 131* 109* 127*  BUN 38* 33* 36*  CREATININE 1.61* 1.55* 1.65*  CALCIUM 8.3* 8.2* 8.1*  MG 1.8  --   --    Liver Function Tests: No results for input(s): "AST", "ALT", "ALKPHOS", "BILITOT", "PROT", "ALBUMIN" in the last 72 hours. No results for input(s): "LIPASE", "AMYLASE" in the last 72 hours. CBC: Recent Labs    11/28/22 0426 11/29/22 0329  WBC 6.4 5.5  NEUTROABS  --  2.9  HGB 11.4* 11.7*  HCT 33.1* 34.9*  MCV 90.2 90.9  PLT 125* 130*   Cardiac Enzymes: No results for input(s): "CKTOTAL", "CKMB", "CKMBINDEX", "TROPONINIHS" in the last 72 hours.  BNP: No results for input(s): "BNP" in the last 72 hours. D-Dimer: No results for input(s): "DDIMER" in the last 72 hours. Hemoglobin A1C: No results for input(s): "HGBA1C" in the last 72 hours. Fasting Lipid Panel: No results for input(s): "CHOL", "HDL", "LDLCALC", "TRIG", "CHOLHDL", "LDLDIRECT" in the last 72 hours. Thyroid Function Tests:  No  results for input(s): "TSH", "T4TOTAL", "T3FREE", "THYROIDAB" in the last 72 hours.  Invalid input(s): "FREET3" Anemia Panel: No results for input(s): "VITAMINB12", "FOLATE", "FERRITIN", "TIBC", "IRON", "RETICCTPCT" in the last 72 hours.   Radiology: [facility] Chest Port 1 View  Result Date: 11/28/2022 CLINICAL DATA:  Pacemaker placement EXAM: PORTABLE CHEST 1 VIEW COMPARISON:  Portable exam 1353 hours compared to 11/27/2022 FINDINGS: LEFT subclavian transvenous pacemaker with leads projecting over RIGHT atrium and RIGHT ventricle. Upper normal size of cardiac silhouette. Mediastinal contours and pulmonary vascularity normal. Atherosclerotic calcification aorta. Lungs clear. No pulmonary infiltrate, pleural effusion, or pneumothorax. Osseous demineralization with RIGHT shoulder prosthesis. IMPRESSION: No pneumothorax following pacemaker placement. Aortic Atherosclerosis (ICD10-I70.0). Electronically Signed   By: Ulyses Southward M.D.   On: 11/28/2022 15:57   EP PPM/ICD IMPLANT  Result Date: 11/28/2022 Successful repositioning of atrial and ventricular pacemaker leads   DG Chest 2 View  Result Date: 11/27/2022 CLINICAL DATA:  Status post pacemaker placement EXAM: CHEST - 2 VIEW COMPARISON:  X-ray 11/24/2022 FINDINGS: Enlarged heart. Calcified aorta. Left chest pacemaker. The leads however are retracted with tips overlying the left brachiocephalic vein in the central SVC above the heart. There is more portions of the wires overlapping the pacemaker battery pack. There is some linear opacity at the bases likely scar or atelectasis. No pneumothorax or edema. No consolidation. Overlapping cardiac leads. Right shoulder reverse arthroplasty. Critical Value/emergent results were called by telephone at the time of interpretation on 11/27/2022 at 11:10 am to provider Jodie Echevaria , who verbally acknowledged these results. IMPRESSION: Retracted pacemaker leads now with the tips overlying the brachiocephalic vein in the central  SVC. Electronically Signed   By: Karen Kays M.D.   On: 11/27/2022 14:13   DG Chest Portable 1 View  Result Date: 11/24/2022 CLINICAL DATA:  Weakness and nausea EXAM: PORTABLE CHEST 1 VIEW COMPARISON:  11/15/2022 FINDINGS: Stable cardiomegaly. Aortic atherosclerotic calcification. Left chest wall pacemaker. Bibasilar atelectasis/scarring. No focal pneumonia, definite pleural effusion, or pneumothorax. Reverse right TSA. IMPRESSION: No active disease.  Cardiomegaly. Electronically Signed   By: Minerva Fester M.D.   On: 11/24/2022 21:48   DG Chest Port 1 View  Result Date: 11/15/2022 CLINICAL DATA:  Status post placement of pacemaker EXAM: PORTABLE CHEST 1 VIEW COMPARISON:  08/22/2022 FINDINGS: Transverse diameter of heart is increased. There are no signs of alveolar pulmonary edema. Linear densities are seen in medial left lower lung field. There is interval placement of pacemaker battery in the left infraclavicular region with tips of leads in right atrium and right ventricle. There is previous reverse arthroplasty right shoulder. IMPRESSION: Interval placement of pacemaker battery in the left infraclavicular region. There is no pleural effusion or pneumothorax. Linear densities in medial left lower lung field may suggest scarring or subsegmental atelectasis. Electronically Signed   By: Ernie Avena M.D.   On: 11/15/2022 14:44   EP PPM/ICD IMPLANT  Result Date: 11/15/2022 Successful dual-chamber pacemaker generator implantation    ECHO 06/06/2022  1. Left ventricular ejection fraction, by estimation, is 60 to 65%. The  left ventricle has normal function. The left ventricle has no regional  wall motion abnormalities. There is mild left ventricular hypertrophy.  Left ventricular diastolic parameters  are indeterminate.   2. Right ventricular systolic function is normal. The right ventricular  size is normal.   3. Left atrial size was mildly dilated.   4. The mitral valve is degenerative.  Mild mitral valve regurgitation.   5. Tricuspid valve regurgitation is  mild to moderate.   6. The aortic valve is calcified. Aortic valve regurgitation is not  visualized. Mild aortic valve stenosis. Aortic valve mean gradient  measures 11.5 mmHg.   7. The inferior vena cava is normal in size with greater than 50%  respiratory variability, suggesting right atrial pressure of 3 mmHg.   TELEMETRY reviewed by me (LT) 11/29/2022 : AF 100-110  EKG reviewed by me: AF 100BPM  Data reviewed by me (LT) 11/29/2022:  hospitalist progress note, last 24h vitals tele labs imaging I/O    Principal Problem:   AKI (acute kidney injury) (HCC) Active Problems:   Hypertension   Sick sinus syndrome (HCC)   CKD stage 3 due to type 2 diabetes mellitus (HCC)   UTI (urinary tract infection)   Bradycardia   Paroxysmal atrial fibrillation (HCC)   Pacemaker lead malfunction    ASSESSMENT AND PLAN:  Leslie Parker. Leslie Parker is an 60yoF with a PMH of sick sinus syndrome s/p recent [employer] DC PPM implant 11/15/2022, paroxysmal AF (eliquis), DM2, COPD, who presented to [facility] ED 11/24/2022 with sudden onset positional dizziness. She had recently seen cardiology in clinic on 5/3 for routine follow up and incision check after the pacemaker was placed. At that visit, her BP remained elevated and her home dose of cardizem was increased from 240mg  daily to 360mg  daily. She was initially bradycardic to the 50s, so BB and CCB were held with improvement in HR. IVF given for AKI with improvement.  PPM interrogation on 5/7 showed both atrial and ventricular leads with failed to capture, chest x-ray confirmed malposition/retraction of both atrial and ventricular leads in the SVC. The patient underwent pacemaker lead repositioning this morning without untoward event.  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 On tele today noted dyssynchrony with pacemaker spikes & QRS complexes + patient having abdominal spasms. Bedside device interrogation 5/7 showed failure to capture for both atrial and ventricular lead, likely indicating both leads have been dislodged, confirmed on CXR showing both leads retracted with tips overlying the brachiocephalic vein in the central SVC.  Now s/p pacemaker lead repositioning this morning which the patient tolerated well. -Consolidate Cardizem to 300 mg CD today -Continue amiodarone, metoprolol, and hydralazine as ordered -Restart eliquis 2.5mg  BID today - Reinforced importance for left arm precautions and additional postprocedure instructions and her granddaughter -start Keflex 500 mg twice daily x 10 days today for skin prophylaxis. -Repeat chest x-ray without pneumothorax  Ok for discharge from a cardiac perspective today, will arrange for follow-up with Dr. Juliann Pares in clinic in 1 week.  This patient's plan of care was discussed and created with Dr. Darrold Junker and he is in agreement.  Signed: Rebeca Allegra , PA-C 11/29/2022, 1:26 PM [facility]

## 2022-11-30 ENCOUNTER — Telehealth: Payer: Self-pay

## 2022-11-30 ENCOUNTER — Telehealth: Payer: Self-pay | Admitting: *Deleted

## 2022-11-30 DIAGNOSIS — I4891 Unspecified atrial fibrillation: Secondary | ICD-10-CM

## 2022-11-30 DIAGNOSIS — E119 Type 2 diabetes mellitus without complications: Secondary | ICD-10-CM

## 2022-11-30 DIAGNOSIS — I10 Essential (primary) hypertension: Secondary | ICD-10-CM

## 2022-11-30 NOTE — Progress Notes (Unsigned)
  Care Coordination  Outreach Note  11/30/2022 Name: Leslie Parker MRN: [id number] DOB: [birth year]   Care Coordination Outreach Attempts: An unsuccessful telephone outreach was attempted today to offer the patient information about available care coordination services.  Follow Up Plan:  Additional outreach attempts will be made to offer the patient care coordination information and services.   Encounter Outcome:  No Answer  Burman Nieves, CCMA Care Coordination Care Guide Direct Dial: [phone]

## 2022-12-03 NOTE — Progress Notes (Unsigned)
  Care Coordination  Outreach Note  12/03/2022 Name: Leslie Parker MRN: [id number] DOB: March 01, 1933   Care Coordination Outreach Attempts: A second unsuccessful outreach was attempted today to offer the patient with information about available care coordination services.  Follow Up Plan:  Additional outreach attempts will be made to offer the patient care coordination information and services.   Encounter Outcome:  No Answer  Burman Nieves, CCMA Care Coordination Care Guide Direct Dial: [phone]

## 2022-12-04 NOTE — Progress Notes (Signed)
  Care Coordination   Note   12/04/2022 Name: Leslie Parker MRN: [id number] DOB: [birth year]  Leslie Parker is a 89 y.o. year old female who sees [name], MD for primary care. I reached out to [name] by phone today to offer care coordination services.  [name] was given information about Care Coordination services today including:   The Care Coordination services include support from the care team which includes your Nurse Coordinator, Clinical Social Worker, or Pharmacist.  The Care Coordination team is here to help remove barriers to the health concerns and goals most important to you. Care Coordination services are voluntary, and the patient may decline or stop services at any time by request to their care team member.   Care Coordination Consent Status: Patient agreed to services and verbal consent obtained.   Follow up plan:  Telephone appointment with care coordination team member scheduled for:  12/11/2022  Encounter Outcome:  Pt. Scheduled from referral   Burman Nieves, [facility] Care Coordination Care Guide Direct Dial: [phone]

## 2022-12-05 DIAGNOSIS — I4891 Unspecified atrial fibrillation: Secondary | ICD-10-CM | POA: Diagnosis not present

## 2022-12-07 DIAGNOSIS — Z950 Presence of cardiac pacemaker: Secondary | ICD-10-CM | POA: Diagnosis not present

## 2022-12-10 DIAGNOSIS — R079 Chest pain, unspecified: Secondary | ICD-10-CM | POA: Diagnosis not present

## 2022-12-11 ENCOUNTER — Encounter: Payer: Self-pay | Admitting: *Deleted

## 2022-12-11 ENCOUNTER — Ambulatory Visit: Payer: Self-pay | Admitting: *Deleted

## 2022-12-11 NOTE — Patient Outreach (Signed)
  Care Coordination   Initial Visit Note   12/11/2022 Name: Leslie Parker MRN: [id number] DOB: 1932-12-05  Leslie Parker is a 89 y.o. year old female who sees [name], MD for primary care. I spoke with  [name] by phone today.  What matters to the patients health and wellness today?  Pacemaker placed 4/25, had to go back for revision on 5/4 due to wires being in "wrong direction."      Goals Addressed             This Visit's Progress    Recover from pacemaker placement       Care Coordination Interventions: Evaluation of current treatment plan related to pacemaker placement and patient's adherence to plan as established by provider Advised patient to keep incision site clean and dry, decreasing risk for infection Reviewed scheduled/upcoming provider appointments including follow up with cardioloy tomorrow Pharmacy referral for medication assistance for Eliquis Discussed plans with patient for ongoing care management follow up and provided patient with direct contact information for care management team Screening for signs and symptoms of depression related to chronic disease state  Assessed social determinant of health barriers         SDOH assessments and interventions completed:  Yes  SDOH Interventions Today    Flowsheet Row Most Recent Value  SDOH Interventions   Food Insecurity Interventions Intervention Not Indicated  Housing Interventions Intervention Not Indicated  Transportation Interventions Intervention Not Indicated        Care Coordination Interventions:  Yes, provided   Interventions Today    Flowsheet Row Most Recent Value  Chronic Disease   Chronic disease during today's visit Other  General Interventions   General Interventions Discussed/Reviewed General Interventions Reviewed, Doctor Visits, Durable Medical Equipment (DME)  Doctor Visits Discussed/Reviewed Doctor Visits Reviewed  [Cardiology tomorrow, EP follow up 5/24, PCP 6/6,  and pulmonary 7/16.  Granddaughter provides transportation]  Horticulturist, commercial (DME) Walker  Education Interventions   Education Provided Provided Education  Provided [employer] On Other, When to see the doctor, Medication  [Discussed infection prevention, monitors blood pressure daily. Today it was 103/46 and HR 72, state usually range 110-120/70s. Discussed using sling to prevent complication]        Follow up plan: Follow up call scheduled for 6/21    Encounter Outcome:  Pt. Visit Completed   Kemper Durie, RN, MSN, [facility] Care Management Care Management Coordinator [phone]

## 2022-12-12 DIAGNOSIS — I10 Essential (primary) hypertension: Secondary | ICD-10-CM | POA: Diagnosis not present

## 2022-12-12 DIAGNOSIS — I495 Sick sinus syndrome: Secondary | ICD-10-CM | POA: Diagnosis not present

## 2022-12-12 DIAGNOSIS — N1832 Chronic kidney disease, stage 3b: Secondary | ICD-10-CM | POA: Diagnosis not present

## 2022-12-12 DIAGNOSIS — E119 Type 2 diabetes mellitus without complications: Secondary | ICD-10-CM | POA: Diagnosis not present

## 2022-12-12 DIAGNOSIS — J449 Chronic obstructive pulmonary disease, unspecified: Secondary | ICD-10-CM | POA: Diagnosis not present

## 2022-12-12 DIAGNOSIS — Z950 Presence of cardiac pacemaker: Secondary | ICD-10-CM | POA: Diagnosis not present

## 2022-12-12 DIAGNOSIS — I4891 Unspecified atrial fibrillation: Secondary | ICD-10-CM | POA: Diagnosis not present

## 2022-12-13 ENCOUNTER — Encounter: Payer: Self-pay | Admitting: Pharmacist

## 2022-12-13 DIAGNOSIS — Z5986 Financial insecurity: Secondary | ICD-10-CM

## 2022-12-13 NOTE — Progress Notes (Signed)
[facility])                                            [facility] Team    12/13/2022  Leslie Parker [birth year] [id number]  Patient was referred for medication assistance with Eliquis and Trelegy. Unfortunately, she did not answer the phone. HIPAA compliant message was left on her voicemail.  Plan: Call patient back in 1-3 business days,  Beecher Mcardle, PharmD, [facility] Clinical Pharmacist [phone]

## 2022-12-19 ENCOUNTER — Telehealth: Payer: Self-pay | Admitting: Pharmacist

## 2022-12-19 NOTE — Progress Notes (Addendum)
[facility]) Care Management  [facility]   12/19/2022  Leslie Parker 14-Feb-1933 [id number]  Reason for referral: Medication Assistance  Referral source: [facility] Case Manager Referral medication(s): Eliquis and Trelegy Current insurance:[employer]   Medication Assistance Findings:  Medication assistance needs identified: Patient was called. HIPAA identifiers were obtained.  Patient confirmed needing assistance with Eliquis and Trelegy.  She said her Provider's office had already given her an application for Eliquis and that she sent it in along with the necessary supporting documentation.  She said she had not heard from them in over 3 months.    The [employer] Patient Assistance Program was called with the patient on conference call. The representative said they are missing the patient's proof of income as well as CURRENT medication spending.   Patient was educated on the necessary financial documents needed.  She communicated understanding.    After initial review, she appears to qualify for both programs. She is over income for LIS/Extra Help.      Additional medication assistance options reviewed with patient as warranted:  No other options identified  Plan: I will route patient assistance letter to [facility] pharmacy technician who will coordinate patient assistance program application process for medications listed above.  [facility] pharmacy technician will assist with obtaining all required documents from both patient and provider(s) and submit application(s) once completed.    Process will take 6-8 weeks ( at least).  Patient was instructed to continue to purchase her medications as she has been.   Beecher Mcardle, PharmD, BCACP [facility] Clinical Pharmacist [phone]

## 2022-12-20 ENCOUNTER — Telehealth: Payer: Self-pay | Admitting: Pharmacy Technician

## 2022-12-20 DIAGNOSIS — Z780 Asymptomatic menopausal state: Secondary | ICD-10-CM | POA: Diagnosis not present

## 2022-12-20 DIAGNOSIS — Z5986 Financial insecurity: Secondary | ICD-10-CM

## 2022-12-20 DIAGNOSIS — E1122 Type 2 diabetes mellitus with diabetic chronic kidney disease: Secondary | ICD-10-CM | POA: Diagnosis not present

## 2022-12-20 NOTE — Progress Notes (Signed)
[facility] Customer service manager [facility])                                            [facility] Quality Pharmacy Team    12/20/2022  Leslie Parker 10-27-1932 [id number]                                      Medication Assistance Referral  Referral From: [facility] RPh Katina B.  Medication/Company: Eliquis / [employer] Patient application portion:  Mailed Provider application portion: Faxed  to Dr. Dorothyann Peng Provider address/fax verified via: Office website  Medication/Company: Trelegy / [employer] Patient application portion:  Mailed Provider application portion: Faxed  to Dr. Ned Clines Provider address/fax verified via: Office website   Aasir Daigler P. Shaianne Nucci, CPhT [facility] [employer]  [phone]

## 2023-01-10 ENCOUNTER — Telehealth: Payer: Self-pay | Admitting: Pharmacy Technician

## 2023-01-10 DIAGNOSIS — Z5986 Financial insecurity: Secondary | ICD-10-CM

## 2023-01-10 NOTE — Progress Notes (Signed)
[facility] Customer service manager [facility])                                            [facility] Quality Pharmacy Team    01/10/2023  Leslie Parker May 27, 1933 [id number]  Received patient  portion(s) ONLY of patient assistance application(s) for Eliquis. Faxed completed application and required documents into [employer].  NOTE: Have NOT received back MD portion of [employer] application from Dr. Juliann Pares. Refaxed to his office today.  GSK application for Trelelgy is currently on hold b/c Steward Drone on behalg of Dr. Meredeth Ide faxed back a note on the provider's portion informing "Patient wants to hold off on Trelegy patient assistance. She wants to disucuss using medication with Dr. Meredeth Ide in July. Thanks, Steward Drone"  Pattricia Boss, CPhT [facility] Office: [phone] Fax: [phone] Email: [email or web address]

## 2023-01-11 ENCOUNTER — Ambulatory Visit: Payer: Self-pay | Admitting: *Deleted

## 2023-01-11 NOTE — Patient Outreach (Signed)
  Care Coordination   01/11/2023 Name: Leslie Parker MRN: [id number] DOB: 1932-08-01   Care Coordination Outreach Attempts:  An unsuccessful telephone outreach was attempted for a scheduled appointment today.  Follow Up Plan:  Additional outreach attempts will be made to offer the patient care coordination information and services.   Encounter Outcome:  No Answer   Care Coordination Interventions:  No, not indicated    Kemper Durie, RN, MSN, [facility] Care Management Care Management Coordinator [phone]

## 2023-01-11 NOTE — Patient Outreach (Signed)
  Care Coordination   Follow Up Visit Note   01/11/2023 Name: Leslie Parker MRN: [id number] DOB: 10-Sep-1932  Leslie Parker is a 89 y.o. year old female who sees Leslie Baas, MD for primary care. I spoke with  Hendricks Parker by phone today.  What matters to the patients health and wellness today?  Ongoing management and recovery from pacer placement    Goals Addressed             This Visit's Progress    Recover from pacemaker placement   On track    Care Coordination Interventions: Evaluation of current treatment plan related to pacemaker placement and patient's adherence to plan as established by provider Advised patient to keep incision site clean and dry, decreasing risk for infection Reviewed scheduled/upcoming provider appointments including follow up with cardioloy tomorrow Pharmacy referral for medication assistance for Trelegy Discussed plans with patient for ongoing care management follow up and provided patient with direct contact information for care management team         SDOH assessments and interventions completed:  No     Care Coordination Interventions:  Yes, provided   Interventions Today    Flowsheet Row Most Recent Value  Chronic Disease   Chronic disease during today's visit Congestive Heart Failure (CHF), Atrial Fibrillation (AFib)  General Interventions   General Interventions Discussed/Reviewed General Interventions Reviewed, Doctor Visits, Durable Medical Equipment (DME)  Doctor Visits Discussed/Reviewed Doctor Visits Reviewed, PCP, Specialist  [facility] PCP visit due to virus, state she is waiting on call to reschedule.  Advised to call office to reschedule.]  Durable Medical Equipment (DME) BP Cuff  [Reminded to monitor HR and BP daily]  PCP/Specialist Visits Compliance with follow-up visit  [cardiology office 6/26 and pulmonary 7/16]  Exercise Interventions   Exercise Discussed/Reviewed Weight Managment  Weight Management Weight  maintenance  [Reminded to do daily weights]  Education Interventions   Education Provided Provided Education  Provided Verbal Education On Medication  [discussed medication management, including financial assistance for Eliquis and Trelegy.  Eliquis has been approved, waiting until after next pulmonary visit to proceed wtih Trelegy, want to make sure she will remain on it]       Follow up plan: Follow up call scheduled for 7/19    Encounter Outcome:  Pt. Visit Completed   Leslie Durie, RN, MSN, [facility] Care Management Care Management Coordinator [phone]

## 2023-01-15 DIAGNOSIS — N1832 Chronic kidney disease, stage 3b: Secondary | ICD-10-CM | POA: Diagnosis not present

## 2023-01-15 DIAGNOSIS — Z950 Presence of cardiac pacemaker: Secondary | ICD-10-CM | POA: Diagnosis not present

## 2023-01-15 DIAGNOSIS — I495 Sick sinus syndrome: Secondary | ICD-10-CM | POA: Diagnosis not present

## 2023-01-15 DIAGNOSIS — E119 Type 2 diabetes mellitus without complications: Secondary | ICD-10-CM | POA: Diagnosis not present

## 2023-01-15 DIAGNOSIS — J449 Chronic obstructive pulmonary disease, unspecified: Secondary | ICD-10-CM | POA: Diagnosis not present

## 2023-01-15 DIAGNOSIS — I4891 Unspecified atrial fibrillation: Secondary | ICD-10-CM | POA: Diagnosis not present

## 2023-01-15 DIAGNOSIS — J4530 Mild persistent asthma, uncomplicated: Secondary | ICD-10-CM | POA: Diagnosis not present

## 2023-01-15 DIAGNOSIS — I10 Essential (primary) hypertension: Secondary | ICD-10-CM | POA: Diagnosis not present

## 2023-01-15 DIAGNOSIS — R079 Chest pain, unspecified: Secondary | ICD-10-CM | POA: Diagnosis not present

## 2023-01-16 DIAGNOSIS — I495 Sick sinus syndrome: Secondary | ICD-10-CM | POA: Diagnosis not present

## 2023-01-16 DIAGNOSIS — Z950 Presence of cardiac pacemaker: Secondary | ICD-10-CM | POA: Diagnosis not present

## 2023-01-18 ENCOUNTER — Telehealth: Payer: Self-pay | Admitting: Pharmacy Technician

## 2023-01-18 DIAGNOSIS — Z5986 Financial insecurity: Secondary | ICD-10-CM

## 2023-01-18 NOTE — Progress Notes (Signed)
[facility])                                            [facility] Quality Pharmacy Team    01/18/2023  Leslie Parker 09/04/1932 [id number]  Care coordination call placed to [employer] in regard to Eliquis application.  Spoke to [place] who informs patient is APPROVED 01/16/23-06/3123. [employer] will auto fill and ship approximately every 90 days and be delivered to the patient's home. If patient feels current supply is not sufficient to last until next supply, then patient may call [employer] at [phone] or [facility] which is [employer]'s pharmacy at [phone] to check on next shipment.  Pattricia Boss, CPhT [facility] Office: [phone] Fax: [phone] Email: [email or web address]

## 2023-02-05 DIAGNOSIS — R0602 Shortness of breath: Secondary | ICD-10-CM | POA: Diagnosis not present

## 2023-02-05 DIAGNOSIS — J301 Allergic rhinitis due to pollen: Secondary | ICD-10-CM | POA: Diagnosis not present

## 2023-02-05 DIAGNOSIS — J449 Chronic obstructive pulmonary disease, unspecified: Secondary | ICD-10-CM | POA: Diagnosis not present

## 2023-02-08 ENCOUNTER — Ambulatory Visit: Payer: Self-pay | Admitting: *Deleted

## 2023-02-08 NOTE — Patient Outreach (Signed)
  Care Coordination   Follow Up Visit Note   02/08/2023 Name: Leslie Parker MRN: [id number] DOB: Sep 04, 1932  Leslie Parker is a 89 y.o. year old female who sees [name], MD for primary care. I spoke with  [name] by phone today.  What matters to the patients health and wellness today?  Better management of COPD    Goals Addressed             This Visit's Progress    Management of COPD       Interventions Today    Flowsheet Row Most Recent Value  Chronic Disease   Chronic disease during today's visit Chronic Obstructive Pulmonary Disease (COPD), Atrial Fibrillation (AFib)  General Interventions   General Interventions Discussed/Reviewed General Interventions Reviewed, Doctor Visits  Doctor Visits Discussed/Reviewed Doctor Visits Reviewed, PCP, Specialist  [Will see PCP in November, waiting for call if she can go sooner for annula physical.  Pulmonary 9/16, cardiology 11/27]  PCP/Specialist Visits Compliance with follow-up visit  [pacer placement follow up done, no issues reported.  Pulmonary office visit since last outreach due to cough/congestion, state she is feeling better after antibiotics]  Education Interventions   Education Provided Provided Education  Provided Verbal Education On Medication, Other, When to see the doctor  [started on Trellegy, using samples provided by MD office]  Pharmacy Interventions   Pharmacy Dicussed/Reviewed Affording Medications, Referral to Pharmacist  Referral to Pharmacist Cannot afford medications  [Report Trellegy cost may be too expensive after samples]           COMPLETED: Recover from pacemaker placement   On track    Care Coordination Interventions: Evaluation of current treatment plan related to pacemaker placement and patient's adherence to plan as established by provider Advised patient to keep incision site clean and dry, decreasing risk for infection Discussed plans with patient for ongoing care management follow  up and provided patient with direct contact information for care management team         SDOH assessments and interventions completed:  No     Care Coordination Interventions:  Yes, provided   Follow up plan: Follow up call scheduled for 8/9    Encounter Outcome:  Pt. Visit Completed   Kemper Durie, RN, MSN, [facility] Care Management Care Management Coordinator [phone]

## 2023-02-13 ENCOUNTER — Inpatient Hospital Stay: Payer: Medicare HMO | Admitting: Anesthesiology

## 2023-02-13 ENCOUNTER — Inpatient Hospital Stay
Admit: 2023-02-13 | Discharge: 2023-02-13 | Disposition: A | Discharge status: Home / Self Care | Payer: Medicare HMO | Attending: Cardiology | Admitting: Cardiology

## 2023-02-13 ENCOUNTER — Encounter
Admission: EM | Disposition: A | Discharge status: Skilled Nursing Facility | Payer: Self-pay | Source: Home / Self Care | Attending: Osteopathic Medicine

## 2023-02-13 ENCOUNTER — Inpatient Hospital Stay
Admission: EM | Admit: 2023-02-13 | Discharge: 2023-02-15 | DRG: 481 | Disposition: A | Discharge status: Skilled Nursing Facility | Payer: Medicare HMO | Attending: Osteopathic Medicine | Admitting: Osteopathic Medicine

## 2023-02-13 ENCOUNTER — Inpatient Hospital Stay: Payer: Medicare HMO

## 2023-02-13 ENCOUNTER — Emergency Department: Payer: Medicare HMO

## 2023-02-13 ENCOUNTER — Other Ambulatory Visit: Payer: Self-pay

## 2023-02-13 DIAGNOSIS — E1121 Type 2 diabetes mellitus with diabetic nephropathy: Secondary | ICD-10-CM

## 2023-02-13 DIAGNOSIS — Z888 Allergy status to other drugs, medicaments and biological substances status: Secondary | ICD-10-CM

## 2023-02-13 DIAGNOSIS — Z8249 Family history of ischemic heart disease and other diseases of the circulatory system: Secondary | ICD-10-CM

## 2023-02-13 DIAGNOSIS — I517 Cardiomegaly: Secondary | ICD-10-CM | POA: Diagnosis not present

## 2023-02-13 DIAGNOSIS — I480 Paroxysmal atrial fibrillation: Secondary | ICD-10-CM | POA: Diagnosis not present

## 2023-02-13 DIAGNOSIS — Z7951 Long term (current) use of inhaled steroids: Secondary | ICD-10-CM

## 2023-02-13 DIAGNOSIS — I10 Essential (primary) hypertension: Secondary | ICD-10-CM | POA: Diagnosis present

## 2023-02-13 DIAGNOSIS — Z794 Long term (current) use of insulin: Secondary | ICD-10-CM | POA: Diagnosis not present

## 2023-02-13 DIAGNOSIS — J439 Emphysema, unspecified: Secondary | ICD-10-CM | POA: Diagnosis not present

## 2023-02-13 DIAGNOSIS — Z9841 Cataract extraction status, right eye: Secondary | ICD-10-CM | POA: Diagnosis not present

## 2023-02-13 DIAGNOSIS — Z96653 Presence of artificial knee joint, bilateral: Secondary | ICD-10-CM | POA: Diagnosis present

## 2023-02-13 DIAGNOSIS — E1129 Type 2 diabetes mellitus with other diabetic kidney complication: Secondary | ICD-10-CM | POA: Diagnosis present

## 2023-02-13 DIAGNOSIS — W010XXA Fall on same level from slipping, tripping and stumbling without subsequent striking against object, initial encounter: Secondary | ICD-10-CM | POA: Diagnosis present

## 2023-02-13 DIAGNOSIS — R001 Bradycardia, unspecified: Secondary | ICD-10-CM | POA: Diagnosis not present

## 2023-02-13 DIAGNOSIS — I5032 Chronic diastolic (congestive) heart failure: Secondary | ICD-10-CM | POA: Diagnosis not present

## 2023-02-13 DIAGNOSIS — Z7401 Bed confinement status: Secondary | ICD-10-CM | POA: Diagnosis not present

## 2023-02-13 DIAGNOSIS — Z7984 Long term (current) use of oral hypoglycemic drugs: Secondary | ICD-10-CM | POA: Diagnosis not present

## 2023-02-13 DIAGNOSIS — Z961 Presence of intraocular lens: Secondary | ICD-10-CM | POA: Diagnosis present

## 2023-02-13 DIAGNOSIS — E1122 Type 2 diabetes mellitus with diabetic chronic kidney disease: Secondary | ICD-10-CM | POA: Diagnosis present

## 2023-02-13 DIAGNOSIS — Z79899 Other long term (current) drug therapy: Secondary | ICD-10-CM

## 2023-02-13 DIAGNOSIS — S72101A Unspecified trochanteric fracture of right femur, initial encounter for closed fracture: Secondary | ICD-10-CM | POA: Diagnosis not present

## 2023-02-13 DIAGNOSIS — I495 Sick sinus syndrome: Secondary | ICD-10-CM | POA: Diagnosis not present

## 2023-02-13 DIAGNOSIS — Y92009 Unspecified place in unspecified non-institutional (private) residence as the place of occurrence of the external cause: Secondary | ICD-10-CM

## 2023-02-13 DIAGNOSIS — I482 Chronic atrial fibrillation, unspecified: Secondary | ICD-10-CM | POA: Diagnosis not present

## 2023-02-13 DIAGNOSIS — Z5320 Procedure and treatment not carried out because of patient's decision for unspecified reasons: Secondary | ICD-10-CM | POA: Diagnosis present

## 2023-02-13 DIAGNOSIS — E1169 Type 2 diabetes mellitus with other specified complication: Secondary | ICD-10-CM

## 2023-02-13 DIAGNOSIS — S299XXA Unspecified injury of thorax, initial encounter: Secondary | ICD-10-CM | POA: Diagnosis not present

## 2023-02-13 DIAGNOSIS — W19XXXA Unspecified fall, initial encounter: Secondary | ICD-10-CM | POA: Diagnosis not present

## 2023-02-13 DIAGNOSIS — Z950 Presence of cardiac pacemaker: Secondary | ICD-10-CM

## 2023-02-13 DIAGNOSIS — S72141A Displaced intertrochanteric fracture of right femur, initial encounter for closed fracture: Secondary | ICD-10-CM | POA: Diagnosis not present

## 2023-02-13 DIAGNOSIS — I130 Hypertensive heart and chronic kidney disease with heart failure and stage 1 through stage 4 chronic kidney disease, or unspecified chronic kidney disease: Secondary | ICD-10-CM | POA: Diagnosis not present

## 2023-02-13 DIAGNOSIS — Z7901 Long term (current) use of anticoagulants: Secondary | ICD-10-CM | POA: Diagnosis not present

## 2023-02-13 DIAGNOSIS — M8580 Other specified disorders of bone density and structure, unspecified site: Secondary | ICD-10-CM | POA: Diagnosis present

## 2023-02-13 DIAGNOSIS — Z01810 Encounter for preprocedural cardiovascular examination: Secondary | ICD-10-CM | POA: Diagnosis not present

## 2023-02-13 DIAGNOSIS — E1165 Type 2 diabetes mellitus with hyperglycemia: Secondary | ICD-10-CM | POA: Diagnosis present

## 2023-02-13 DIAGNOSIS — N179 Acute kidney failure, unspecified: Secondary | ICD-10-CM | POA: Diagnosis not present

## 2023-02-13 DIAGNOSIS — J449 Chronic obstructive pulmonary disease, unspecified: Secondary | ICD-10-CM | POA: Diagnosis not present

## 2023-02-13 DIAGNOSIS — I5033 Acute on chronic diastolic (congestive) heart failure: Secondary | ICD-10-CM | POA: Diagnosis not present

## 2023-02-13 DIAGNOSIS — S72001A Fracture of unspecified part of neck of right femur, initial encounter for closed fracture: Secondary | ICD-10-CM | POA: Diagnosis not present

## 2023-02-13 DIAGNOSIS — N1832 Chronic kidney disease, stage 3b: Secondary | ICD-10-CM | POA: Diagnosis present

## 2023-02-13 DIAGNOSIS — Z825 Family history of asthma and other chronic lower respiratory diseases: Secondary | ICD-10-CM | POA: Diagnosis not present

## 2023-02-13 DIAGNOSIS — D6489 Other specified anemias: Secondary | ICD-10-CM | POA: Diagnosis not present

## 2023-02-13 DIAGNOSIS — R9431 Abnormal electrocardiogram [ECG] [EKG]: Secondary | ICD-10-CM | POA: Diagnosis not present

## 2023-02-13 DIAGNOSIS — M25572 Pain in left ankle and joints of left foot: Secondary | ICD-10-CM | POA: Diagnosis not present

## 2023-02-13 DIAGNOSIS — S72001D Fracture of unspecified part of neck of right femur, subsequent encounter for closed fracture with routine healing: Secondary | ICD-10-CM | POA: Diagnosis not present

## 2023-02-13 DIAGNOSIS — E1151 Type 2 diabetes mellitus with diabetic peripheral angiopathy without gangrene: Secondary | ICD-10-CM | POA: Diagnosis present

## 2023-02-13 DIAGNOSIS — M25551 Pain in right hip: Secondary | ICD-10-CM | POA: Diagnosis not present

## 2023-02-13 DIAGNOSIS — Z96611 Presence of right artificial shoulder joint: Secondary | ICD-10-CM | POA: Diagnosis present

## 2023-02-13 DIAGNOSIS — Z9842 Cataract extraction status, left eye: Secondary | ICD-10-CM | POA: Diagnosis not present

## 2023-02-13 DIAGNOSIS — D72829 Elevated white blood cell count, unspecified: Secondary | ICD-10-CM | POA: Diagnosis present

## 2023-02-13 DIAGNOSIS — J4489 Other specified chronic obstructive pulmonary disease: Secondary | ICD-10-CM | POA: Diagnosis present

## 2023-02-13 DIAGNOSIS — R54 Age-related physical debility: Secondary | ICD-10-CM | POA: Diagnosis present

## 2023-02-13 DIAGNOSIS — I700 Atherosclerosis of aorta: Secondary | ICD-10-CM | POA: Diagnosis not present

## 2023-02-13 DIAGNOSIS — M62838 Other muscle spasm: Secondary | ICD-10-CM | POA: Diagnosis present

## 2023-02-13 HISTORY — PX: INTRAMEDULLARY (IM) NAIL INTERTROCHANTERIC: SHX5875

## 2023-02-13 HISTORY — DX: Fracture of unspecified part of neck of right femur, initial encounter for closed fracture: S72.001A

## 2023-02-13 LAB — BASIC METABOLIC PANEL
Anion gap: 7 (ref 5–15)
BUN: 43 mg/dL — ABNORMAL HIGH (ref 8–23)
CO2: 21 mmol/L — ABNORMAL LOW (ref 22–32)
Calcium: 8.6 mg/dL — ABNORMAL LOW (ref 8.9–10.3)
Chloride: 107 mmol/L (ref 98–111)
Creatinine, Ser: 1.74 mg/dL — ABNORMAL HIGH (ref 0.44–1.00)
GFR, Estimated: 28 mL/min — ABNORMAL LOW (ref >60)
Glucose, Bld: 133 mg/dL — ABNORMAL HIGH (ref 70–99)
Potassium: 4.1 mmol/L (ref 3.5–5.1)
Sodium: 135 mmol/L (ref 135–145)

## 2023-02-13 LAB — GLUCOSE, CAPILLARY
Glucose-Capillary: 155 mg/dL — ABNORMAL HIGH (ref 70–99)
Glucose-Capillary: 201 mg/dL — ABNORMAL HIGH (ref 70–99)
Glucose-Capillary: 155 mg/dL — ABNORMAL HIGH (ref 70–99)
Glucose-Capillary: 193 mg/dL — ABNORMAL HIGH (ref 70–99)
Glucose-Capillary: 270 mg/dL — ABNORMAL HIGH (ref 70–99)

## 2023-02-13 LAB — TYPE AND SCREEN
ABO/RH(D): A POS
Antibody Screen: NEGATIVE

## 2023-02-13 LAB — ECHOCARDIOGRAM COMPLETE
AR max vel: 1.52 cm2
AV Area VTI: 1.8 cm2
AV Area mean vel: 1.64 cm2
AV Mean grad: 11.7 mmHg
AV Peak grad: 19.5 mmHg
Ao pk vel: 2.21 m/s
Area-P 1/2: 2.02 cm2
MV VTI: 2.06 cm2
S' Lateral: 2.6 cm

## 2023-02-13 LAB — CBC WITH DIFFERENTIAL/PLATELET
Abs Immature Granulocytes: 0.21 10*3/uL — ABNORMAL HIGH (ref 0.00–0.07)
Basophils Absolute: 0.1 10*3/uL (ref 0.0–0.1)
Basophils Relative: 0 %
Eosinophils Absolute: 0.2 10*3/uL (ref 0.0–0.5)
Eosinophils Relative: 2 %
HCT: 36.5 % (ref 36.0–46.0)
Hemoglobin: 13 g/dL (ref 12.0–15.0)
Immature Granulocytes: 2 %
Lymphocytes Relative: 11 %
Lymphs Abs: 1.4 10*3/uL (ref 0.7–4.0)
MCH: 31.6 pg (ref 26.0–34.0)
MCHC: 35.6 g/dL (ref 30.0–36.0)
MCV: 88.8 fL (ref 80.0–100.0)
Monocytes Absolute: 0.8 10*3/uL (ref 0.1–1.0)
Monocytes Relative: 7 %
Neutro Abs: 10.2 10*3/uL — ABNORMAL HIGH (ref 1.7–7.7)
Neutrophils Relative %: 78 %
Platelets: 202 10*3/uL (ref 150–400)
RBC: 4.11 MIL/uL (ref 3.87–5.11)
RDW: 13.2 % (ref 11.5–15.5)
WBC: 12.9 10*3/uL — ABNORMAL HIGH (ref 4.0–10.5)
nRBC: 0 % (ref 0.0–0.2)

## 2023-02-13 LAB — BRAIN NATRIURETIC PEPTIDE: B Natriuretic Peptide: 350.5 pg/mL — ABNORMAL HIGH (ref 0.0–100.0)

## 2023-02-13 LAB — TROPONIN I (HIGH SENSITIVITY)
Troponin I (High Sensitivity): 18 ng/L — ABNORMAL HIGH (ref <18)
Troponin I (High Sensitivity): 21 ng/L — ABNORMAL HIGH (ref <18)

## 2023-02-13 LAB — PROTIME-INR
INR: 1.1 (ref 0.8–1.2)
Prothrombin Time: 14.9 seconds (ref 11.4–15.2)

## 2023-02-13 LAB — CK: Total CK: 93 U/L (ref 38–234)

## 2023-02-13 LAB — APTT: aPTT: 26 seconds (ref 24–36)

## 2023-02-13 LAB — POTASSIUM: Potassium: 4.1 mmol/L (ref 3.5–5.1)

## 2023-02-13 SURGERY — FIXATION, FRACTURE, INTERTROCHANTERIC, WITH INTRAMEDULLARY ROD
Anesthesia: General | Site: Hip | Laterality: Right

## 2023-02-13 MED ORDER — FLEET ENEMA 7-19 GM/118ML RE ENEM: 1.0000 | ENEMA | Freq: Once | RECTAL | Status: DC | PRN

## 2023-02-13 MED ORDER — IPRATROPIUM-ALBUTEROL 0.5-2.5 (3) MG/3ML IN SOLN: 3.0000 mL | Freq: Four times a day (QID) | RESPIRATORY_TRACT | Status: DC | PRN

## 2023-02-13 MED ORDER — ADULT MULTIVITAMIN W/MINERALS CH
1.0000 | ORAL_TABLET | Freq: Every day | ORAL | Status: DC
  Administered 2023-02-14 – 2023-02-15 (×2): 1 via ORAL
  Filled 2023-02-13 (×2): qty 1

## 2023-02-13 MED ORDER — FENTANYL CITRATE (PF) 100 MCG/2ML IJ SOLN
INTRAMUSCULAR | Status: DC | PRN
  Administered 2023-02-13 (×2): 50 ug via INTRAVENOUS

## 2023-02-13 MED ORDER — BISACODYL 10 MG RE SUPP: 10.0000 mg | Freq: Every day | RECTAL | Status: DC | PRN

## 2023-02-13 MED ORDER — DEXAMETHASONE SODIUM PHOSPHATE 10 MG/ML IJ SOLN
INTRAMUSCULAR | Status: DC | PRN
  Administered 2023-02-13: 10 mg via INTRAVENOUS

## 2023-02-13 MED ORDER — COLESTIPOL HCL 1 G PO TABS
1.0000 g | ORAL_TABLET | Freq: Two times a day (BID) | ORAL | Status: DC
  Administered 2023-02-13 – 2023-02-15 (×4): 1 g via ORAL
  Filled 2023-02-13 (×4): qty 1

## 2023-02-13 MED ORDER — SUGAMMADEX SODIUM 200 MG/2ML IV SOLN
INTRAVENOUS | Status: DC | PRN
  Administered 2023-02-13: 200 mg via INTRAVENOUS

## 2023-02-13 MED ORDER — HYDRALAZINE HCL 20 MG/ML IJ SOLN
5.0000 mg | INTRAMUSCULAR | Status: DC | PRN
  Administered 2023-02-13 (×2): 5 mg via INTRAVENOUS
  Filled 2023-02-13 (×2): qty 1

## 2023-02-13 MED ORDER — MORPHINE SULFATE (PF) 4 MG/ML IV SOLN
4.0000 mg | Freq: Once | INTRAVENOUS | Status: AC
  Administered 2023-02-13: 4 mg via INTRAVENOUS
  Filled 2023-02-13: qty 1

## 2023-02-13 MED ORDER — DIPHENHYDRAMINE HCL 12.5 MG/5ML PO ELIX: 12.5000 mg | ORAL_SOLUTION | ORAL | Status: DC | PRN

## 2023-02-13 MED ORDER — METHOCARBAMOL 500 MG PO TABS: 500.0000 mg | ORAL_TABLET | Freq: Three times a day (TID) | ORAL | Status: DC | PRN

## 2023-02-13 MED ORDER — ONDANSETRON HCL 4 MG/2ML IJ SOLN
4.0000 mg | Freq: Three times a day (TID) | INTRAMUSCULAR | Status: DC | PRN
  Administered 2023-02-13 (×2): 4 mg via INTRAVENOUS
  Filled 2023-02-13 (×2): qty 2

## 2023-02-13 MED ORDER — ACETAMINOPHEN 325 MG PO TABS: 325.0000 mg | ORAL_TABLET | Freq: Four times a day (QID) | ORAL | Status: DC | PRN

## 2023-02-13 MED ORDER — FUROSEMIDE 20 MG PO TABS
20.0000 mg | ORAL_TABLET | Freq: Every day | ORAL | Status: DC
  Administered 2023-02-13 – 2023-02-15 (×3): 20 mg via ORAL
  Filled 2023-02-13 (×3): qty 1

## 2023-02-13 MED ORDER — ONDANSETRON HCL 4 MG/2ML IJ SOLN
INTRAMUSCULAR | Status: AC
  Filled 2023-02-13: qty 2

## 2023-02-13 MED ORDER — METOCLOPRAMIDE HCL 5 MG/ML IJ SOLN: 5.0000 mg | Freq: Three times a day (TID) | INTRAMUSCULAR | Status: DC | PRN

## 2023-02-13 MED ORDER — SODIUM CHLORIDE 0.9 % IV SOLN: INTRAVENOUS | Status: DC

## 2023-02-13 MED ORDER — APIXABAN 2.5 MG PO TABS
2.5000 mg | ORAL_TABLET | Freq: Two times a day (BID) | ORAL | Status: DC
  Administered 2023-02-14: 2.5 mg via ORAL
  Filled 2023-02-13: qty 1

## 2023-02-13 MED ORDER — ADULT MULTIVITAMIN W/MINERALS CH: 1.0000 | ORAL_TABLET | Freq: Every day | ORAL | Status: DC

## 2023-02-13 MED ORDER — OXYCODONE HCL 5 MG/5ML PO SOLN: 5.0000 mg | Freq: Once | ORAL | Status: DC | PRN

## 2023-02-13 MED ORDER — AMIODARONE HCL 200 MG PO TABS
200.0000 mg | ORAL_TABLET | Freq: Every day | ORAL | Status: DC
  Administered 2023-02-13 – 2023-02-15 (×3): 200 mg via ORAL
  Filled 2023-02-13 (×3): qty 1

## 2023-02-13 MED ORDER — FENTANYL CITRATE (PF) 100 MCG/2ML IJ SOLN
INTRAMUSCULAR | Status: AC
  Filled 2023-02-13: qty 2

## 2023-02-13 MED ORDER — CEFAZOLIN SODIUM-DEXTROSE 2-4 GM/100ML-% IV SOLN
INTRAVENOUS | Status: AC
  Filled 2023-02-13: qty 100

## 2023-02-13 MED ORDER — PHENYLEPHRINE HCL-NACL 20-0.9 MG/250ML-% IV SOLN
INTRAVENOUS | Status: DC | PRN
  Administered 2023-02-13: 160 ug via INTRAVENOUS
  Administered 2023-02-13: 30 ug/min via INTRAVENOUS

## 2023-02-13 MED ORDER — VITAMIN C 500 MG PO TABS
250.0000 mg | ORAL_TABLET | Freq: Every day | ORAL | Status: DC
  Administered 2023-02-13 – 2023-02-15 (×3): 250 mg via ORAL
  Filled 2023-02-13 (×3): qty 1

## 2023-02-13 MED ORDER — PROPOFOL 10 MG/ML IV BOLUS
INTRAVENOUS | Status: DC | PRN
  Administered 2023-02-13: 70 mg via INTRAVENOUS

## 2023-02-13 MED ORDER — SENNOSIDES-DOCUSATE SODIUM 8.6-50 MG PO TABS: 1.0000 | ORAL_TABLET | Freq: Every evening | ORAL | Status: DC | PRN

## 2023-02-13 MED ORDER — OXYCODONE-ACETAMINOPHEN 5-325 MG PO TABS
1.0000 | ORAL_TABLET | ORAL | Status: DC | PRN
  Administered 2023-02-13 – 2023-02-14 (×2): 1 via ORAL
  Filled 2023-02-13 (×3): qty 1

## 2023-02-13 MED ORDER — DM-GUAIFENESIN ER 30-600 MG PO TB12: 1.0000 | ORAL_TABLET | Freq: Two times a day (BID) | ORAL | Status: DC | PRN

## 2023-02-13 MED ORDER — OXYCODONE HCL 5 MG PO TABS: 5.0000 mg | ORAL_TABLET | Freq: Once | ORAL | Status: DC | PRN

## 2023-02-13 MED ORDER — PHENYLEPHRINE HCL-NACL 20-0.9 MG/250ML-% IV SOLN
INTRAVENOUS | Status: AC
  Filled 2023-02-13: qty 250

## 2023-02-13 MED ORDER — EPINEPHRINE PF 1 MG/ML IJ SOLN
INTRAMUSCULAR | Status: AC
  Filled 2023-02-13: qty 1

## 2023-02-13 MED ORDER — CEFAZOLIN SODIUM-DEXTROSE 2-4 GM/100ML-% IV SOLN
2.0000 g | Freq: Three times a day (TID) | INTRAVENOUS | Status: AC
  Administered 2023-02-13 – 2023-02-14 (×2): 2 g via INTRAVENOUS
  Filled 2023-02-13 (×2): qty 100

## 2023-02-13 MED ORDER — 0.9 % SODIUM CHLORIDE (POUR BTL) OPTIME
TOPICAL | Status: DC | PRN
  Administered 2023-02-13: 500 mL

## 2023-02-13 MED ORDER — METOCLOPRAMIDE HCL 5 MG PO TABS: 5.0000 mg | ORAL_TABLET | Freq: Three times a day (TID) | ORAL | Status: DC | PRN

## 2023-02-13 MED ORDER — MAGNESIUM OXIDE -MG SUPPLEMENT 400 (240 MG) MG PO TABS
400.0000 mg | ORAL_TABLET | Freq: Every day | ORAL | Status: DC
  Administered 2023-02-13 – 2023-02-15 (×3): 400 mg via ORAL
  Filled 2023-02-13 (×3): qty 1

## 2023-02-13 MED ORDER — MAGNESIUM HYDROXIDE 400 MG/5ML PO SUSP: 30.0000 mL | Freq: Every day | ORAL | Status: DC | PRN

## 2023-02-13 MED ORDER — ROCURONIUM BROMIDE 100 MG/10ML IV SOLN
INTRAVENOUS | Status: DC | PRN
  Administered 2023-02-13: 50 mg via INTRAVENOUS

## 2023-02-13 MED ORDER — PANTOPRAZOLE SODIUM 40 MG PO TBEC
40.0000 mg | DELAYED_RELEASE_TABLET | Freq: Every day | ORAL | Status: DC
  Administered 2023-02-13 – 2023-02-15 (×3): 40 mg via ORAL
  Filled 2023-02-13 (×3): qty 1

## 2023-02-13 MED ORDER — MORPHINE SULFATE (PF) 2 MG/ML IV SOLN: 1.0000 mg | INTRAVENOUS | Status: DC | PRN

## 2023-02-13 MED ORDER — PROMETHAZINE (PHENERGAN) 6.25MG IN NS 50ML IVPB
6.2500 mg | Freq: Four times a day (QID) | INTRAVENOUS | Status: DC | PRN
  Filled 2023-02-13: qty 50

## 2023-02-13 MED ORDER — ENSURE ENLIVE PO LIQD
237.0000 mL | Freq: Two times a day (BID) | ORAL | Status: DC
  Administered 2023-02-14 – 2023-02-15 (×2): 237 mL via ORAL
  Filled 2023-02-13: qty 237

## 2023-02-13 MED ORDER — FENTANYL CITRATE (PF) 100 MCG/2ML IJ SOLN
25.0000 ug | INTRAMUSCULAR | Status: DC | PRN
  Administered 2023-02-13 (×2): 50 ug via INTRAVENOUS

## 2023-02-13 MED ORDER — METOPROLOL TARTRATE 25 MG PO TABS
25.0000 mg | ORAL_TABLET | Freq: Two times a day (BID) | ORAL | Status: DC
  Administered 2023-02-13 – 2023-02-15 (×5): 25 mg via ORAL
  Filled 2023-02-13 (×5): qty 1

## 2023-02-13 MED ORDER — INSULIN ASPART 100 UNIT/ML IJ SOLN
0.0000 [IU] | Freq: Three times a day (TID) | INTRAMUSCULAR | Status: DC
  Administered 2023-02-13: 3 [IU] via SUBCUTANEOUS
  Administered 2023-02-13: 2 [IU] via SUBCUTANEOUS
  Administered 2023-02-14 (×3): 3 [IU] via SUBCUTANEOUS
  Administered 2023-02-15: 2 [IU] via SUBCUTANEOUS
  Filled 2023-02-13 (×6): qty 1

## 2023-02-13 MED ORDER — BUPIVACAINE HCL (PF) 0.5 % IJ SOLN
INTRAMUSCULAR | Status: AC
  Filled 2023-02-13: qty 30

## 2023-02-13 MED ORDER — ACETAMINOPHEN 500 MG PO TABS
1000.0000 mg | ORAL_TABLET | Freq: Four times a day (QID) | ORAL | Status: AC
  Administered 2023-02-13 – 2023-02-14 (×3): 1000 mg via ORAL
  Filled 2023-02-13 (×4): qty 2

## 2023-02-13 MED ORDER — ACETAMINOPHEN 325 MG PO TABS: 650.0000 mg | ORAL_TABLET | Freq: Four times a day (QID) | ORAL | Status: DC | PRN

## 2023-02-13 MED ORDER — VITAMIN D 25 MCG (1000 UNIT) PO TABS
1000.0000 [IU] | ORAL_TABLET | Freq: Every day | ORAL | Status: DC
  Administered 2023-02-13 – 2023-02-15 (×3): 1000 [IU] via ORAL
  Filled 2023-02-13 (×3): qty 1

## 2023-02-13 MED ORDER — CEFAZOLIN SODIUM-DEXTROSE 2-4 GM/100ML-% IV SOLN
2.0000 g | INTRAVENOUS | Status: AC
  Administered 2023-02-13: 2 g via INTRAVENOUS

## 2023-02-13 MED ORDER — ONDANSETRON HCL 4 MG/2ML IJ SOLN: 4.0000 mg | Freq: Four times a day (QID) | INTRAMUSCULAR | Status: DC | PRN

## 2023-02-13 MED ORDER — ONDANSETRON HCL 4 MG PO TABS: 4.0000 mg | ORAL_TABLET | Freq: Four times a day (QID) | ORAL | Status: DC | PRN

## 2023-02-13 MED ORDER — BUPIVACAINE LIPOSOME 1.3 % IJ SUSP
INTRAMUSCULAR | Status: AC
  Filled 2023-02-13: qty 10

## 2023-02-13 MED ORDER — DILTIAZEM HCL ER COATED BEADS 120 MG PO CP24
360.0000 mg | ORAL_CAPSULE | Freq: Every day | ORAL | Status: DC
  Administered 2023-02-13 – 2023-02-15 (×3): 360 mg via ORAL
  Filled 2023-02-13: qty 3
  Filled 2023-02-13 (×3): qty 2

## 2023-02-13 MED ORDER — FLUTICASONE FUROATE-VILANTEROL 100-25 MCG/ACT IN AEPB
1.0000 | INHALATION_SPRAY | Freq: Every day | RESPIRATORY_TRACT | Status: DC
  Administered 2023-02-14 – 2023-02-15 (×2): 1 via RESPIRATORY_TRACT
  Filled 2023-02-13: qty 28

## 2023-02-13 MED ORDER — LIDOCAINE HCL (CARDIAC) PF 100 MG/5ML IV SOSY
PREFILLED_SYRINGE | INTRAVENOUS | Status: DC | PRN
  Administered 2023-02-13: 100 mg via INTRAVENOUS

## 2023-02-13 MED ORDER — ALBUTEROL SULFATE (2.5 MG/3ML) 0.083% IN NEBU: 3.0000 mL | INHALATION_SOLUTION | RESPIRATORY_TRACT | Status: DC | PRN

## 2023-02-13 MED ORDER — TRAZODONE HCL 100 MG PO TABS
100.0000 mg | ORAL_TABLET | Freq: Every day | ORAL | Status: DC
  Administered 2023-02-13 – 2023-02-14 (×2): 100 mg via ORAL
  Filled 2023-02-13 (×2): qty 1

## 2023-02-13 MED ORDER — UMECLIDINIUM BROMIDE 62.5 MCG/ACT IN AEPB
1.0000 | INHALATION_SPRAY | Freq: Every day | RESPIRATORY_TRACT | Status: DC
  Administered 2023-02-14 – 2023-02-15 (×2): 1 via RESPIRATORY_TRACT
  Filled 2023-02-13: qty 7

## 2023-02-13 MED ORDER — BUPIVACAINE-EPINEPHRINE 0.5% -1:200000 IJ SOLN
INTRAMUSCULAR | Status: DC | PRN
  Administered 2023-02-13: 40 mL via INTRAMUSCULAR

## 2023-02-13 MED ORDER — MORPHINE SULFATE (PF) 2 MG/ML IV SOLN
1.0000 mg | INTRAVENOUS | Status: DC | PRN
  Administered 2023-02-13: 1 mg via INTRAVENOUS
  Filled 2023-02-13: qty 1

## 2023-02-13 MED ORDER — METOPROLOL SUCCINATE ER 25 MG PO TB24: 25.0000 mg | ORAL_TABLET | Freq: Every day | ORAL | Status: DC

## 2023-02-13 MED ORDER — DOCUSATE SODIUM 100 MG PO CAPS
100.0000 mg | ORAL_CAPSULE | Freq: Two times a day (BID) | ORAL | Status: DC
  Administered 2023-02-13 – 2023-02-15 (×4): 100 mg via ORAL
  Filled 2023-02-13 (×4): qty 1

## 2023-02-13 MED ORDER — INSULIN ASPART 100 UNIT/ML IJ SOLN
0.0000 [IU] | Freq: Every day | INTRAMUSCULAR | Status: DC
  Administered 2023-02-13 – 2023-02-14 (×2): 3 [IU] via SUBCUTANEOUS
  Filled 2023-02-13 (×2): qty 1

## 2023-02-13 MED ORDER — ONDANSETRON HCL 4 MG/2ML IJ SOLN
4.0000 mg | Freq: Once | INTRAMUSCULAR | Status: AC
  Administered 2023-02-13: 4 mg via INTRAVENOUS

## 2023-02-13 SURGICAL SUPPLY — 48 items
APL PRP STRL LF DISP 70% ISPRP (MISCELLANEOUS) ×2
BIT DRILL 4.3MMS DISTAL GRDTED (BIT) IMPLANT
BNDG CMPR 5X4 CHSV STRCH STRL (GAUZE/BANDAGES/DRESSINGS) ×1
BNDG CMPR 5X6 CHSV STRCH STRL (GAUZE/BANDAGES/DRESSINGS) ×1
BNDG COHESIVE 4X5 TAN STRL LF (GAUZE/BANDAGES/DRESSINGS) ×1 IMPLANT
BNDG COHESIVE 6X5 TAN ST LF (GAUZE/BANDAGES/DRESSINGS) ×1 IMPLANT
CHLORAPREP W/TINT 26 (MISCELLANEOUS) ×2 IMPLANT
DRAPE 3/4 80X56 (DRAPES) ×1 IMPLANT
DRAPE C-ARMOR (DRAPES) ×1 IMPLANT
DRILL 4.3MMS DISTAL GRADUATED (BIT) ×1
DRSG MEPILEX SACRM 8.7X9.8 (GAUZE/BANDAGES/DRESSINGS) ×1 IMPLANT
DRSG OPSITE POSTOP 3X4 (GAUZE/BANDAGES/DRESSINGS) IMPLANT
DRSG OPSITE POSTOP 4X6 (GAUZE/BANDAGES/DRESSINGS) IMPLANT
ELECT CAUTERY BLADE 6.4 (BLADE) ×1 IMPLANT
ELECT REM PT RETURN 9FT ADLT (ELECTROSURGICAL) ×1
ELECTRODE REM PT RTRN 9FT ADLT (ELECTROSURGICAL) ×1 IMPLANT
GAUZE SPONGE 4X4 12PLY STRL (GAUZE/BANDAGES/DRESSINGS) ×1 IMPLANT
GLOVE BIO SURGEON STRL SZ8 (GLOVE) ×2 IMPLANT
GLOVE INDICATOR 8.0 STRL GRN (GLOVE) ×1 IMPLANT
GOWN STRL REUS W/ TWL LRG LVL3 (GOWN DISPOSABLE) ×1 IMPLANT
GOWN STRL REUS W/ TWL XL LVL3 (GOWN DISPOSABLE) ×1 IMPLANT
GOWN STRL REUS W/TWL LRG LVL3 (GOWN DISPOSABLE) ×1
GOWN STRL REUS W/TWL XL LVL3 (GOWN DISPOSABLE) ×1
GUIDEPIN VERSANAIL DSP 3.2X444 (ORTHOPEDIC DISPOSABLE SUPPLIES) IMPLANT
GUIDEWIRE BALL NOSE 100CM (WIRE) IMPLANT
HANDLE YANKAUER SUCT OPEN TIP (MISCELLANEOUS) ×1 IMPLANT
MANIFOLD NEPTUNE II (INSTRUMENTS) ×1 IMPLANT
MAT ABSORB FLUID 56X50 GRAY (MISCELLANEOUS) ×1 IMPLANT
NAIL HIP FRACTURE 11X380MM (Nail) IMPLANT
NDL FILTER BLUNT 18X1 1/2 (NEEDLE) ×1 IMPLANT
NDL HYPO 22X1.5 SAFETY MO (MISCELLANEOUS) ×1 IMPLANT
NEEDLE FILTER BLUNT 18X1 1/2 (NEEDLE) ×1 IMPLANT
NEEDLE HYPO 22X1.5 SAFETY MO (MISCELLANEOUS) ×1 IMPLANT
NS IRRIG 500ML POUR BTL (IV SOLUTION) ×1 IMPLANT
PACK HIP COMPR (MISCELLANEOUS) ×1 IMPLANT
SCREW BONE CORTICAL 5.0X44 (Screw) IMPLANT
SCREW LAG 10.5MMX105MM HFN (Screw) IMPLANT
SCREWDRIVER HEX TIP 3.5MM (MISCELLANEOUS) IMPLANT
STAPLER SKIN PROX 35W (STAPLE) ×1 IMPLANT
STRAP SAFETY 5IN WIDE (MISCELLANEOUS) ×1 IMPLANT
SUT VIC AB 0 CT1 36 (SUTURE) ×1 IMPLANT
SUT VIC AB 1 CT1 36 (SUTURE) ×1 IMPLANT
SUT VIC AB 2-0 CT1 (SUTURE) ×2 IMPLANT
SYR 10ML LL (SYRINGE) ×1 IMPLANT
SYR 30ML LL (SYRINGE) ×1 IMPLANT
TAPE MICROFOAM 4IN (TAPE) ×1 IMPLANT
TRAP FLUID SMOKE EVACUATOR (MISCELLANEOUS) ×1 IMPLANT
WATER STERILE IRR 500ML POUR (IV SOLUTION) ×1 IMPLANT

## 2023-02-13 NOTE — Progress Notes (Signed)
*  PRELIMINARY RESULTS* Echocardiogram 2D Echocardiogram has been performed.  Leslie Parker 02/13/2023, 1:04 PM

## 2023-02-13 NOTE — Progress Notes (Signed)
Initial Nutrition Assessment  DOCUMENTATION CODES:   Not applicable  INTERVENTION:  Once diet resumes, recommend: Ensure Enlive po BID, each supplement provides 350 kcal and 20 grams of protein. MVI with minerals daily Update measured weight  NUTRITION DIAGNOSIS:   Increased nutrient needs related to post-op healing, hip fracture as evidenced by estimated needs.  GOAL:   Patient will meet greater than or equal to 90% of their needs   MONITOR:   Diet advancement, Labs, Weight trends, Supplement acceptance  REASON FOR ASSESSMENT:   Consult Hip fracture protocol  ASSESSMENT:   Pt admitted after a fall leading to R hip fracture. PMH significant for HTN, DM, afib on eliquis, CKD, COPD, and asthma.   Pt off unit for fixation of R hip fracture.   Unable to obtain detailed nutrition related history at this time. No documented meal completions as pt was just admitted today. Will monitor follow resumption of diet.   Pt would benefit from addition of nutrition supplements to support post-operative healing.   Limited history of weights on file to review within the last year. Last documented weight was 89 kg on 05/04 from prior admission. Question whether this is actual versus stated. Will request updated weight and monitor throughout admission.   Medications: SSI 0-5 units at bedtime, SSI 0-9 units TID  Labs: BUN 43, Cr 1.74, GFR 28, CBG's 201, 155   NUTRITION - FOCUSED PHYSICAL EXAM: RD working remotely. Deferred to follow up.   Diet Order:   Diet Order             Diet NPO time specified Except for: Sips with Meds, Ice Chips  Diet effective now                   EDUCATION NEEDS:   No education needs have been identified at this time  Skin:  Skin Assessment: Reviewed RN Assessment  Last BM:  7/23  Height:   Ht Readings from Last 1 Encounters:  11/24/22 5\' 9"  (1.753 m)    Weight:   Wt Readings from Last 1 Encounters:  11/24/22 89 kg    Ideal Body  Weight:  65.9 kg  BMI:  There is no height or weight on file to calculate BMI.  Estimated Nutritional Needs:   Kcal:  1600-1800  Protein:  80-95g  Fluid:  >/=1.6L  Drusilla Kanner, RDN, LDN Clinical Nutrition

## 2023-02-13 NOTE — ED Provider Notes (Signed)
[facility] Provider Note    Event Date/Time   First MD Initiated Contact with Patient 02/13/23 0703     (approximate)   History   Chief Complaint Fall   HPI  Leslie Parker is a 89 y.o. female with past medical history of hypertension, diabetes, atrial fibrillation on Eliquis, CKD, COPD, and asthma who presents to the ED complaining of fall.  Patient reports that earlier this morning she was in the bathroom when she went to turn and lost her balance.  She fell to the side and struck her right hip on the edge of the bathtub.  She denies hitting her head or losing consciousness, but has significant right hip pain and has been unable to bear weight on her right leg since the fall.  She denies any headache, neck pain, chest pain, abdominal pain, or upper extremity pain.  She does take Eliquis with her last dose at 10 PM last night.     Physical Exam   Triage Vital Signs: ED Triage Vitals  Encounter Vitals Group     BP 02/13/23 0640 (!) 171/78     Systolic BP Percentile --      Diastolic BP Percentile --      Pulse Rate 02/13/23 0640 67     Resp 02/13/23 0640 16     Temp 02/13/23 0650 98.3 F (36.8 C)     Temp Source 02/13/23 0650 Oral     SpO2 02/13/23 0640 98 %     Weight --      Height --      Head Circumference --      Peak Flow --      Pain Score 02/13/23 0638 8     Pain Loc --      Pain Education --      Exclude from Growth Chart --     Most recent vital signs: Vitals:   02/13/23 0640 02/13/23 0650  BP: (!) 171/78   Pulse: 67   Resp: 16   Temp:  98.3 F (36.8 C)  SpO2: 98%     Constitutional: Alert and oriented. Eyes: Conjunctivae are normal. Head: Atraumatic. Nose: No congestion/rhinnorhea. Mouth/Throat: Mucous membranes are moist.  Neck: No midline cervical spine tenderness to palpation. Cardiovascular: Normal rate, regular rhythm. Grossly normal heart sounds.  2+ radial and DP pulses bilaterally. Respiratory: Normal  respiratory effort.  No retractions. Lungs CTAB.  No chest wall tenderness to palpation. Gastrointestinal: Soft and nontender. No distention. Musculoskeletal: Diffuse tenderness to palpation of right hip with shortening and external rotation noted.  No tenderness to palpation at left hip, bilateral knees or ankles.  No upper extremity bony tenderness to palpation. Neurologic:  Normal speech and language. No gross focal neurologic deficits are appreciated.    ED Results / Procedures / Treatments   Labs (all labs ordered are listed, but only abnormal results are displayed) Labs Reviewed  BASIC METABOLIC PANEL - Abnormal; Notable for the following components:      Result Value   CO2 21 (*)    Glucose, Bld 133 (*)    BUN 43 (*)    Creatinine, Ser 1.74 (*)    Calcium 8.6 (*)    GFR, Estimated 28 (*)    All other components within normal limits  CBC WITH DIFFERENTIAL/PLATELET     EKG  ED ECG REPORT I, Chesley Noon, the attending physician, personally viewed and interpreted this ECG.   Date: 02/13/2023  EKG Time: 8:00  Rate: 68  Rhythm: normal sinus rhythm  Axis: Normal  Intervals:none  ST&T Change: Inferolateral T wave inversions  RADIOLOGY Right hip x-ray reviewed and interpreted by me with intertrochanteric fracture, no dislocation noted.  PROCEDURES:  Critical Care performed: No  Procedures   MEDICATIONS ORDERED IN ED: Medications  ceFAZolin (ANCEF) IVPB 2g/100 mL premix (has no administration in time range)  morphine (PF) 4 MG/ML injection 4 mg (4 mg Intravenous Given 02/13/23 0740)     IMPRESSION / MDM / ASSESSMENT AND PLAN / ED COURSE  I reviewed the triage vital signs and the nursing notes.                              89 y.o. female with past medical history of hypertension, diabetes, atrial fibrillation on Eliquis, CKD, and COPD who presents to the ED following slip and fall onto her right hip.  Patient's presentation is most consistent with acute  presentation with potential threat to life or bodily function.  Differential diagnosis includes, but is not limited to, hip fracture, dislocation, contusion, arrhythmia, anemia, electrolyte abnormality.  Patient nontoxic-appearing and in no acute distress, vital signs are unremarkable.  She has obvious deformity to her right hip with shortening and external rotation of the leg.  She remains neurovascularly intact distally with strong DP pulses.  No evidence of injury to her head, neck, trunk, or upper extremities.  X-ray of right hip is concerning for intertrochanteric fracture, we will treat symptomatically with IV morphine.  EKG and labs are pending, plan to discuss with orthopedics and admit to hospital.  Labs show stable chronic kidney disease with no acute electrolyte abnormality, pain improved following dose of IV morphine.  Case discussed with Dr. Joice Lofts of orthopedics, who will plan for operative repair later this afternoon.  Case discussed with hospitalist for admission.      FINAL CLINICAL IMPRESSION(S) / ED DIAGNOSES   Final diagnoses:  Fall, initial encounter  Closed fracture of right hip, initial encounter [facility])     Rx / DC Orders   ED Discharge Orders     None        Note:  This document was prepared using Dragon voice recognition software and may include unintentional dictation errors.   Chesley Noon, MD 02/13/23 [phone]

## 2023-02-13 NOTE — Op Note (Signed)
02/13/2023  3:23 PM  Patient:   Leslie Parker  Pre-Op Diagnosis:   Closed displaced intertrochanteric fracture, right hip.  Post-Op Diagnosis:   Same  Procedure:   Reduction and internal fixation of displaced intertrochanteric right hip fracture with Biomet Affixis TFN nail.  Surgeon:   Maryagnes Amos, MD  Assistant:   None  Anesthesia:   GET  Findings:   As above  Complications:   None  EBL:   100 cc  Fluids:   1000 cc crystalloid  UOP:   None  TT:   None  Drains:   None  Closure:   Staples  Implants:   Biomet Affixis 11 x 380 mm TFN with a 105 mm lag screw and a 44 mm distal interlocking screw  Brief Clinical Note:   The patient is an 89 year old female who sustained the above-noted injury earlier this morning when she apparently lost her balance and fell while in her bathroom. She was brought to the emergency room where x-rays demonstrated the above-noted injury. The patient has been cleared medically and presents at this time for reduction and internal fixation of the displaced intertrochanteric right hip fracture.  Procedure:   The patient was brought into the operating room and lain in the supine position. After adequate general endotracheal intubation and anesthesia were obtained, the patient was repositioned on the fracture table. The uninjured leg was placed in a flexed and abducted position while the injured lower extremity was placed in longitudinal traction. The fracture was reduced using longitudinal traction and internal rotation. The adequacy of reduction was verified fluoroscopically in AP and lateral projections and found to be near anatomic. The lateral aspects of the right hip and thigh were prepped with ChloraPrep solution before being draped sterilely. Preoperative antibiotics were administered. A timeout was performed to verify the appropriate surgical site.   The greater trochanter was identified fluoroscopically and an approximately 4-5 cm incision  made about 2-3 fingerbreadths above the tip of the greater trochanter. The incision was carried down through the subcutaneous tissues to expose the gluteal fascia. This was split the length of the incision, providing access to the tip of the trochanter. Under fluoroscopic guidance, a guidewire was drilled through the tip of the trochanter into the proximal metaphysis to the level of the lesser trochanter. After verifying its position fluoroscopically in AP and lateral projections, it was overreamed with the initial reamer to the depth of the lesser trochanter. A guidewire was passed down through the femoral canal to the supracondylar region. The adequacy of guidewire position was verified fluoroscopically in AP and lateral projections before the length of the guidewire within the canal was measured and found to be 405 mm. Therefore, a 380 mm length nail was selected. The guidewire was overreamed sequentially using the flexible reamers, beginning with a 10.5 mm reamer and progressing to a 12.5 mm reamer. This provided good cortical chatter. The 11 x 380 mm Biomet Affixis TFN rod was selected and advanced to the appropriate depth, as verified fluoroscopically.   The guide system for the lag screw was positioned and advanced through an approximately 2 cm stab incision over the lateral aspect of the proximal femur. The guidewire was drilled up through the trochanteric femoral nail and into the femoral neck to rest within 5 mm of subchondral bone. After verifying its position in the femoral neck and head in both AP and lateral projections, the guidewire was measured and found to be optimally replicated by a 105 mm  lag screw. The guidewire was overreamed to the appropriate depth before the lag screw was inserted and advanced to the appropriate depth as verified fluoroscopically in AP and lateral projections. Longitudinal traction was released from the leg and compression applied across the fracture site using the  appropriate device. The locking screw was advanced, then backed off a quarter turn to set the lag screw. Again the adequacy of hardware position and fracture reduction was verified fluoroscopically in AP and lateral projections and found to be excellent.  Attention was directed distally. Using the "perfect circle" technique, the leg and fluoroscopy machine were positioned appropriately. An approximately 1.5 cm stab incision was made over the skin at the appropriate point before the drill bit was advanced through the cortex and across the static hole of the nail. The appropriate length of the screw was determined before the 44 mm distal interlocking screw was positioned, then advanced and tightened securely. Again the adequacy of screw position was verified fluoroscopically in AP and lateral projections and found to be excellent.  The wounds were irrigated thoroughly with sterile saline solution before the abductor fascia was reapproximated using #0 Vicryl interrupted sutures. The subcutaneous tissues were closed using 2-0 Vicryl interrupted sutures. The skin was closed using staples. A solution of 30 cc of 0.5% Sensorcaine with epinephrine plus 10 cc of Exparel was injected in and around all incisions to help with postoperative analgesia. Sterile occlusive dressings were applied to all wounds before the patient was awakened, extubated, and returned to the recovery room in satisfactory condition after tolerating the procedure well.

## 2023-02-13 NOTE — Progress Notes (Signed)
Patient awake/alert to surroundings and surgery complete.  States "I'm tired" Cardiac monitor shows AV paced. Patient denies nausea at this time, tolerated sips of water without event. Denies chest pain/shortness of breath.  Right hip with x3 dressings, ice to area. Lab draw at 1545 for trop. Pre-op echo done by cardiology : no concerning abnormalities per Dr. Juliann Pares.

## 2023-02-13 NOTE — Transfer of Care (Signed)
Immediate Anesthesia Transfer of Care Note  Patient: Leslie Parker  Procedure(s) Performed: INTRAMEDULLARY (IM) NAIL INTERTROCHANTERIC (Right: Hip)  Patient Location: PACU  Anesthesia Type:General  Level of Consciousness: awake and alert   Airway & Oxygen Therapy: Patient Spontanous Breathing  Post-op Assessment: Report given to RN and Post -op Vital signs reviewed and stable  Post vital signs: stable  Last Vitals:  Vitals Value Taken Time  BP 171/71 02/13/23 1517  Temp    Pulse 64 02/13/23 1519  Resp 13 02/13/23 1519  SpO2 97 % 02/13/23 1519  Vitals shown include unfiled device data.  Last Pain:  Vitals:   02/13/23 0912  TempSrc:   PainSc: 10-Worst pain ever         Complications: No notable events documented.

## 2023-02-13 NOTE — Consult Note (Signed)
ORTHOPAEDIC CONSULTATION  REQUESTING PHYSICIAN: Lorretta Harp, MD  Chief Complaint:   Right hip pain.  History of Present Illness: Leslie Parker is a 89 y.o. female with multiple medical problems including chronic atrial fibrillation for which she is on Eliquis, asthma/COPD, chronic renal insufficiency, diabetes, hypertension, and osteopenia who normally lives independently.  Apparently she lost her balance and fell while turning in her bathroom, causing her to fall onto her right side.  She was unable to get up and so was brought to the emergency room where x-rays confirmed the presence of a displaced intertrochanteric fracture of the right hip.  The patient denies any associated injuries.  She did not strike her head or lose consciousness.  The patient also denies any lightheadedness, dizziness, chest pain, shortness of breath, or other symptoms which may have precipitated her fall.  Past Medical History:  Diagnosis Date   Asthma    Atrial fibrillation with RVR (HCC)    Cancer (HCC)    Basal Cell   Diabetes mellitus without complication (HCC)    Heart murmur    Hypertension    Impetigo    Osteoarthritis    Osteopenia    Vomiting    can not due to surgery   Wears dentures    full upper and lower   Past Surgical History:  Procedure Laterality Date   ABDOMINAL HYSTERECTOMY     BACK SURGERY     BLADDER SURGERY     mesh   CARPAL TUNNEL RELEASE Bilateral    CATARACT EXTRACTION Right 2017   CATARACT EXTRACTION W/PHACO Left 02/06/2016   Procedure: CATARACT EXTRACTION PHACO AND INTRAOCULAR LENS PLACEMENT (IOC) left eye;  Surgeon: Sherald Hess, MD;  Location: [facility];  Service: Ophthalmology;  Laterality: Left;  DIABETIC LEFT Cannot arrive before 9:30   CERVICAL DISC SURGERY     CHOLECYSTECTOMY     EYE SURGERY Bilateral    Cataract Extraction with IOL   FEMUR IM NAIL Left 02/04/2015   Procedure:  INTRAMEDULLARY (IM) NAIL FEMORAL;  Surgeon: Juanell Fairly, MD;  Location: [facility];  Service: Orthopedics;  Laterality: Left;   HARDWARE REMOVAL Left 01/17/2017   Procedure: HARDWARE REMOVAL;  Surgeon: Juanell Fairly, MD;  Location: [facility];  Service: Orthopedics;  Laterality: Left;   HERNIA REPAIR  2014   esophageal and gastric mesh. patient unable to throw up d/t mesh   HIP ARTHROPLASTY Left 01/26/2017   Procedure: ARTHROPLASTY BIPOLAR HIP (HEMIARTHROPLASTY) removal hardware left hip;  Surgeon: Deeann Saint, MD;  Location: [facility];  Service: Orthopedics;  Laterality: Left;   JOINT REPLACEMENT Bilateral    knees   PACEMAKER IMPLANT N/A 11/15/2022   Procedure: PACEMAKER IMPLANT;  Surgeon: Marcina Millard, MD;  Location: [facility];  Service: Cardiovascular;  Laterality: N/A;   PACEMAKER IMPLANT N/A 11/28/2022   Procedure: PACEMAKER IMPLANT;  Surgeon: Marcina Millard, MD;  Location: [facility];  Service: Cardiovascular;  Laterality: N/A;  Lead reposition   REPLACEMENT TOTAL KNEE BILATERAL Bilateral [id number]   SHOULDER ARTHROSCOPY WITH OPEN ROTATOR CUFF REPAIR AND DISTAL CLAVICLE ACROMINECTOMY Left 10/25/2016   Procedure: SHOULDER ARTHROSCOPY WITH OPEN ROTATOR CUFF REPAIR AND DISTAL CLAVICLE ACROMINECTOMY;  Surgeon: Juanell Fairly, MD;  Location: [facility];  Service: Orthopedics;  Laterality: Left;   THYROID SURGERY     goiter removed   TOTAL HIP REVISION Left 12/02/2017   Procedure: TOTAL HIP REVISION;  Surgeon: Lyndle Herrlich, MD;  Location: [facility];  Service: Orthopedics;  Laterality: Left;  TOTAL SHOULDER REPLACEMENT Right 2012   Social History   Socioeconomic History   Marital status: Divorced    Spouse name: Not on file   Number of children: Not on file   Years of education: Not on file   Highest education level: Not on file  Occupational History   Not on file  Tobacco Use   Smoking status: Never   Smokeless tobacco: Never  Vaping Use   Vaping  status: Never Used  Substance and Sexual Activity   Alcohol use: No    Alcohol/week: 0.0 standard drinks of alcohol   Drug use: No   Sexual activity: Never  Other Topics Concern   Not on file  Social History Narrative   Not on file   Social Determinants of Health   Financial Resource Strain: Not on file  Food Insecurity: No Food Insecurity (02/13/2023)   Hunger Vital Sign    Worried About Running Out of Food in the Last Year: Never true    Ran Out of Food in the Last Year: Never true  Transportation Needs: No Transportation Needs (02/13/2023)   PRAPARE - Administrator, Civil Service (Medical): No    Lack of Transportation (Non-Medical): No  Physical Activity: Not on file  Stress: Not on file  Social Connections: Not on file   Family History  Problem Relation Age of Onset   Heart failure Mother    Hypertension Mother    Emphysema Father    Allergies  Allergen Reactions   Baclofen Other (See Comments)    Elevated Cr   Prior to Admission medications   Medication Sig Start Date End Date Taking? Authorizing Provider  amiodarone (PACERONE) 200 MG tablet Take 200 mg by mouth daily.   Yes [provider]  apixaban (ELIQUIS) 2.5 MG TABS tablet Take 1 tablet (2.5 mg total) by mouth 2 (two) times daily. 06/07/22  Yes Sreenath, Sudheer B, MD  ascorbic acid (VITAMIN C) 250 MG CHEW Chew 250 mg by mouth daily.   Yes [provider]  Cyanocobalamin (B-12) 2500 MCG TABS Take 2,500 mcg by mouth daily.   Yes [provider]  diltiazem (CARDIZEM CD) 300 MG 24 hr capsule Take 1 capsule (300 mg total) by mouth daily. 11/29/22 06/27/23 Yes Lurene Shadow, MD  furosemide (LASIX) 20 MG tablet Take 20 mg by mouth daily. 03/06/22  Yes [provider]  glipiZIDE (GLUCOTROL XL) 2.5 MG 24 hr tablet Take 2.5 mg by mouth daily with breakfast. 03/27/22  Yes [provider]  Magnesium Oxide 500 MG CAPS Take 500 mg by mouth daily.   Yes [provider]  metoprolol succinate (TOPROL-XL) 25 MG 24 hr tablet Take 1 tablet (25 mg total) by mouth daily. 11/16/22  Yes Tang, Cheryln Manly, PA-C  Multiple Vitamin (MULTIVITAMIN WITH MINERALS) TABS tablet Take 1 tablet by mouth daily. One-A-Day Women's Vitamin   Yes [provider]  traZODone (DESYREL) 100 MG tablet Take 100 mg by mouth at bedtime.   Yes [provider]  TRELEGY ELLIPTA 100-62.5-25 MCG/ACT AEPB Inhale 1 puff into the lungs daily. 08/03/22  Yes [provider]  albuterol (VENTOLIN HFA) 108 (90 Base) MCG/ACT inhaler Inhale 2 puffs into the lungs every 6 (six) hours as needed. 08/04/19   [provider]  cephALEXin (KEFLEX) 500 MG capsule Take 1 capsule (500 mg total) by mouth every 12 (twelve) hours. Patient not taking: Reported on 02/13/2023 11/29/22   Lurene Shadow, MD  colestipol (COLESTID) 1  g tablet Take 1 tablet by mouth 2 (two) times daily. 06/01/22   [provider]  ipratropium-albuterol (DUONEB) 0.5-2.5 (3) MG/3ML SOLN Inhale 3 mLs into the lungs every 6 (six) hours as needed. 06/01/22   [provider]   DG Hip Unilat W or Wo Pelvis 2-3 Views Right  Result Date: 02/13/2023 CLINICAL DATA:  Right hip pain EXAM: DG HIP (WITH OR WITHOUT PELVIS) 3V RIGHT COMPARISON:  None Available. FINDINGS: Acute intertrochanteric femoral neck fracture extending through the lesser trochanter. The femoral head remains located within the acetabulum. Surgical changes of prior left hip arthroplasty are evident. Atherosclerotic vascular calcifications visualized in the bilateral superficial femoral arteries. The bony pelvis appears intact. Mineralization is low suggesting underlying osteoporosis. Unremarkable bowel gas pattern. IMPRESSION: 1. Positive for acute intertrochanteric right femoral neck fracture. 2. Surgical changes of prior left hip total arthroplasty. 3. Peripheral arterial disease. Electronically Signed   By: Malachy Moan M.D.    On: 02/13/2023 07:29   DG Chest 1 View  Result Date: 02/13/2023 CLINICAL DATA:  [id number].  Fall injury, initial encounter. EXAM: CHEST  1 VIEW COMPARISON:  Portable chest 11/28/2022 FINDINGS: Left chest tube pacing system and wire insertions are unchanged. Mild cardiomegaly. No evidence of CHF. The aorta is tortuous and calcified with stable mediastinum. The lungs are mildly emphysematous but clear.  The sulci are sharp. There are no new osseous findings with osteopenia and again seen reverse right shoulder arthroplasty, mild thoracic dextroscoliosis and degenerative changes. IMPRESSION: 1. No evidence of acute chest disease.  Stable COPD chest. 2. Aortic atherosclerosis and uncoiling. Electronically Signed   By: Almira Bar M.D.   On: 02/13/2023 07:28    Positive ROS: All other systems have been reviewed and were otherwise negative with the exception of those mentioned in the HPI and as above.  Physical Exam: General:  Alert, no acute distress Psychiatric:  Patient is competent for consent with normal mood and affect   Cardiovascular:  No pedal edema Respiratory:  No wheezing, non-labored breathing GI:  Abdomen is soft and non-tender Skin:  No lesions in the area of chief complaint Neurologic:  Sensation intact distally Lymphatic:  No axillary or cervical lymphadenopathy  Orthopedic Exam:  Orthopedic examination is limited to the right hip and lower extremity.  The right lower extremity is somewhat shortened and externally rotated as compared to the left.  Skin inspection around the right hip is unremarkable.  No swelling, erythema, ecchymosis, abrasions, or other skin abnormalities are identified.  She has mild tenderness to palpation over the anterolateral aspect of the hip.  She has more severe pain with any attempted active or passive motion of the right hip.  She is grossly neurovascular intact to the right lower extremity and foot.  X-rays:  Recent x-rays of the pelvis and right hip are  available for review and have been reviewed by myself.  The findings are as described above.  She has a displaced intertrochanteric fracture of the right hip.  No significant degenerative changes of the hip joint are identified.  No lytic lesions or other acute bony abnormalities are observed.  Assessment: Closed displaced intertrochanteric fracture right hip.  Plan: The treatment options, including both surgical and nonsurgical choices, have been discussed in detail with the patient.  The patient would like to proceed with surgical intervention to include an intramedullary nailing of the displaced intertrochanteric fracture of her right hip.  The risks (including bleeding, infection, nerve and/or blood vessel injury, persistent or recurrent  pain, loosening or failure of the components, leg length inequality, need for further surgery, blood clots, strokes, heart attacks or arrhythmias, pneumonia, etc.) and benefits of the surgical procedure were discussed.  The patient states her understanding and agrees to proceed.  She agrees to a blood transfusion if necessary.  A formal written consent will be obtained by the nursing staff.  The patient has been cleared for surgery medically and by the cardiology team.  Thank you for asking me to participate in the care of this most pleasant and unfortunate woman.  I will be happy to follow her with you.   Maryagnes Amos, MD  Beeper #:  [phone]  02/13/2023 1:29 PM

## 2023-02-13 NOTE — Consult Note (Addendum)
Cardiology consult note -   Was asked to see Ms. Leslie Parker. Leslie Parker, who is a pleasant 89yoF well-known to our service from prior hospitalizations.  She has a past medical history significant for paroxysmal AF (Eliquis), SSS s/p recent DC pacemaker battery change out and subsequent refixation of pacemaker leads due to dislodgment in May 2024, chronic HFpEF  (60-65% 05/2022), and COPD who presented to [facility] earlier this morning after a mechanical fall where she turned and lost her balance in the bathroom, unfortunately suffering a right intertrochanteric fracture.  We were asked to evaluate her this morning for medical optimization prior to orthopedic surgery this afternoon.  I evaluated the patient this morning and she was somewhat uncomfortable appearing stating she was nauseous, with pain in her right hip.  She received some IV narcotics earlier and also Zofran but nausea appears to be refractory to this.  She tells me she has had a 1 week of intermittent nausea, sometimes positional in nature.  She explicitly denied chest pain prior to her fall overnight or current chest pain, abdominal pain, shortness of breath, heart racing or palpitations, or lower extremity edema.  She is laying flat without orthopnea or PND upon my time of evaluation as an EKG is obtained.  Chest x-ray was without pleural effusions or vascular congestion, BNP slightly elevated at 350.  EKG reveals an atrial paced rhythm with new diffuse T wave inversions, new changes from prior from May 2024.  I reviewed these EKGs and the patient's case with Dr. Juliann Pares who recommended a stat complete echo to evaluate for wall motion abnormalities and troponins.  On exam she is an elderly and frail-appearing female, hemodynamically stable, able to speak in complete sentences but appears somewhat uncomfortable secondary to her nausea.  Lungs are clear to auscultation without crackles or wheezes.  Her heart rate is controlled but rhythm is intermittently  irregular, on telemetry she is intermittently atrial pacing, with paroxysms of atrial fibrillation up to the low 100s.  Lower extremities are without significant peripheral edema with SCDs in place.  Stat echo obtained and was reviewed by Dr. Juliann Pares which was without concerning abnormalities that would preclude prompt orthopedic fixation of her hip fracture.  Plan: -She is at moderate acceptable risk for cardiovascular complications without active heart failure symptoms or further modifiable risk factors/additional cardiac diagnostics recommended prior to proceeding with orthopedic surgery. -Continue metoprolol tartrate pre-, peri-, and postoperatively -Resume Eliquis for stroke prevention as soon as safely able per orthopedic surgery postoperatively  This patient's plan of care was discussed and created with Dr. Juliann Pares and he is in agreement.    Leslie Parker  [facility] 1:16 PM 02/13/23

## 2023-02-13 NOTE — H&P (Signed)
History and Physical    ALIVEA GLADSON UEA:[id number] DOB: [birth year] DOA: 02/13/2023  Referring MD/NP/PA:   PCP: [name], MD   Patient coming from:  The patient is coming from home.     Chief Complaint: fall and right hip  pain  HPI: Leslie Parker is a 89 y.o. female with medical history significant of dCHF, SSS (s/p of PPM), A fib on Eliquis, HTN, HJLD, DM, COPD, CKD-3b, osteopenia, left hip fracture, who presents with fall and right hip pain.  Pt states that she accidentally fell at 5:00 AM when she he was in the bathroom. She states when she went to turn and lost her balance, and fell. She fell to the right side and struck her right hip on the edge of the bathtub. No LOC. She strongly denies any head or neck injury.  She refused CT scan of head and neck.  She developed pain in the right hip, which is constant, sharp, severe, nonradiating. She has been unable to bear weight on her right leg since the fall.  Patient does not have chest pain, cough, shortness of breath.  No nausea, vomiting, diarrhea or abdominal pain.  No symptoms of UTI. She does take Eliquis with her last dose at 10 PM last night.   Data reviewed independently and ED Course: pt was found to have WBC 12.9, renal function close to baseline, temperature normal, blood pressure 171/78, heart rate 67, RR 16, oxygen saturation 98% on room air.  Chest x-ray showed COPD without infiltration.  Patient is admitted to telemetry bed as inpatient.  Dr. Joice Lofts of Ortho and Dr. Juliann Pares of card are consulted.  X-ray of right hip/pelvis: 1. Positive for acute intertrochanteric right femoral neck fracture. 2. Surgical changes of prior left hip total arthroplasty. 3. Peripheral arterial disease.   EKG: Not done in ED, will get one.     Review of Systems:   General: no fevers, chills, no body weight gain, fatigue HEENT: no blurry vision, hearing changes or sore throat Respiratory: no dyspnea, coughing, wheezing CV: no  chest pain, no palpitations GI: no nausea, vomiting, abdominal pain, diarrhea, constipation GU: no dysuria, burning on urination, increased urinary frequency, hematuria  Ext: has trace leg edema Neuro: no unilateral weakness, numbness, or tingling, no vision change or hearing loss. Has fall Skin: no rash, no skin tear. MSK: No muscle spasm. Has right hip pain Heme: No easy bruising.  Travel history: No recent long distant travel.   Allergy:  Allergies  Allergen Reactions   Baclofen Other (See Comments)    Elevated Cr   Simvastatin Other (See Comments)    Pt denies  Elevated LFTs with simva 40mg , stopped and resolved (see MD note 11/10/13)    Past Medical History:  Diagnosis Date   Asthma    Atrial fibrillation with RVR (HCC)    Cancer (HCC)    Basal Cell   Diabetes mellitus without complication (HCC)    Heart murmur    Hypertension    Impetigo    Osteoarthritis    Osteopenia    Vomiting    can not due to surgery   Wears dentures    full upper and lower    Past Surgical History:  Procedure Laterality Date   ABDOMINAL HYSTERECTOMY     BACK SURGERY     BLADDER SURGERY     mesh   CARPAL TUNNEL RELEASE Bilateral    CATARACT EXTRACTION Right 2017   CATARACT EXTRACTION W/PHACO Left 02/06/2016  Procedure: CATARACT EXTRACTION PHACO AND INTRAOCULAR LENS PLACEMENT (IOC) left eye;  Surgeon: Sherald Hess, MD;  Location: [facility];  Service: Ophthalmology;  Laterality: Left;  DIABETIC LEFT Cannot arrive before 9:30   CERVICAL DISC SURGERY     CHOLECYSTECTOMY     EYE SURGERY Bilateral    Cataract Extraction with IOL   FEMUR IM NAIL Left 02/04/2015   Procedure: INTRAMEDULLARY (IM) NAIL FEMORAL;  Surgeon: Juanell Fairly, MD;  Location: [facility];  Service: Orthopedics;  Laterality: Left;   HARDWARE REMOVAL Left 01/17/2017   Procedure: HARDWARE REMOVAL;  Surgeon: Juanell Fairly, MD;  Location: [facility];  Service: Orthopedics;  Laterality: Left;   HERNIA  REPAIR  2014   esophageal and gastric mesh. patient unable to throw up d/t mesh   HIP ARTHROPLASTY Left 01/26/2017   Procedure: ARTHROPLASTY BIPOLAR HIP (HEMIARTHROPLASTY) removal hardware left hip;  Surgeon: Deeann Saint, MD;  Location: [facility];  Service: Orthopedics;  Laterality: Left;   JOINT REPLACEMENT Bilateral    knees   PACEMAKER IMPLANT N/A 11/15/2022   Procedure: PACEMAKER IMPLANT;  Surgeon: Marcina Millard, MD;  Location: [facility];  Service: Cardiovascular;  Laterality: N/A;   PACEMAKER IMPLANT N/A 11/28/2022   Procedure: PACEMAKER IMPLANT;  Surgeon: Marcina Millard, MD;  Location: [facility];  Service: Cardiovascular;  Laterality: N/A;  Lead reposition   REPLACEMENT TOTAL KNEE BILATERAL Bilateral [id number]   SHOULDER ARTHROSCOPY WITH OPEN ROTATOR CUFF REPAIR AND DISTAL CLAVICLE ACROMINECTOMY Left 10/25/2016   Procedure: SHOULDER ARTHROSCOPY WITH OPEN ROTATOR CUFF REPAIR AND DISTAL CLAVICLE ACROMINECTOMY;  Surgeon: Juanell Fairly, MD;  Location: [facility];  Service: Orthopedics;  Laterality: Left;   THYROID SURGERY     goiter removed   TOTAL HIP REVISION Left 12/02/2017   Procedure: TOTAL HIP REVISION;  Surgeon: Lyndle Herrlich, MD;  Location: [facility];  Service: Orthopedics;  Laterality: Left;   TOTAL SHOULDER REPLACEMENT Right 2012    Social History:  reports that she has never smoked. She has never used smokeless tobacco. She reports that she does not drink alcohol and does not use drugs.  Family History:  Family History  Problem Relation Age of Onset   Heart failure Mother    Hypertension Mother    Emphysema Father      Prior to Admission medications   Medication Sig Start Date End Date Taking? Authorizing Provider  albuterol (VENTOLIN HFA) 108 (90 Base) MCG/ACT inhaler Inhale 2 puffs into the lungs every 6 (six) hours as needed. 08/04/19   [provider]  amiodarone (PACERONE) 200 MG tablet Take 200 mg by mouth daily.    [provider]  apixaban (ELIQUIS) 2.5 MG TABS tablet Take 1 tablet (2.5 mg total) by mouth 2 (two) times daily. 06/07/22   Tresa Moore, MD  ascorbic acid (VITAMIN C) 250 MG CHEW Chew 250 mg by mouth daily.    [provider]  cephALEXin (KEFLEX) 500 MG capsule Take 1 capsule (500 mg total) by mouth every 12 (twelve) hours. 11/29/22   Lurene Shadow, MD  colestipol (COLESTID) 1 g tablet Take 1 tablet by mouth 2 (two) times daily. 06/01/22   [provider]  Cyanocobalamin (B-12) 2500 MCG TABS Take 2,500 mcg by mouth daily.    [provider]  diltiazem (CARDIZEM CD) 300 MG 24 hr capsule Take 1 capsule (300 mg total) by mouth daily. 11/29/22 06/27/23  Lurene Shadow, MD  furosemide (LASIX) 20 MG tablet Take 20 mg by mouth  daily. 03/06/22   [provider]  glipiZIDE (GLUCOTROL XL) 2.5 MG 24 hr tablet Take 2.5 mg by mouth daily with breakfast. 03/27/22   [provider]  ipratropium-albuterol (DUONEB) 0.5-2.5 (3) MG/3ML SOLN Inhale 3 mLs into the lungs every 6 (six) hours as needed. 06/01/22   [provider]  Magnesium Oxide 500 MG CAPS Take 500 mg by mouth daily.    [provider]  metoprolol succinate (TOPROL-XL) 25 MG 24 hr tablet Take 1 tablet (25 mg total) by mouth daily. 11/16/22   Tang, Cheryln Manly, PA-C  Multiple Vitamin (MULTIVITAMIN WITH MINERALS) TABS tablet Take 1 tablet by mouth daily. One-A-Day Women's Vitamin    [provider]  traZODone (DESYREL) 100 MG tablet Take 100 mg by mouth at bedtime.    [provider]  TRELEGY ELLIPTA 100-62.5-25 MCG/ACT AEPB Inhale 1 puff into the lungs daily. 08/03/22   [provider]    Physical Exam: Vitals:   02/13/23 1600 02/13/23 1617 02/13/23 1647 02/13/23 1759  BP: (!) 178/68 (!) 164/61 (!) 166/66 128/66  Pulse: 62 60 60 60  Resp: 12 11 16    Temp: 97.7 F (36.5 C)     TempSrc:      SpO2: 100% 100% 100%    General: Not in acute distress HEENT:        Eyes: PERRL, EOMI, no jaundice       ENT: No discharge from the ears and nose, no pharynx injection, no tonsillar enlargement.        Neck: No JVD, no bruit, no mass felt. Heme: No neck lymph node enlargement. Cardiac: S1/S2, RRR, has 1/5 systolic murmurs, No gallops or rubs. Respiratory: No rales, wheezing, rhonchi or rubs. GI: Soft, nondistended, nontender, no rebound pain, no organomegaly, BS present. GU: No hematuria Ext: has trace leg edema bilaterally. 1+DP/PT pulse bilaterally. Musculoskeletal: has tenderness in right hip, right leg is externally rotated Skin: No rashes.  Neuro: Alert, oriented X3, cranial nerves II-XII grossly intact, moves all extremities normally. Psych: Patient is not psychotic, no suicidal or hemocidal ideation.  Labs on Admission: I have personally reviewed following labs and imaging studies  CBC: Recent Labs  Lab 02/13/23 0737  WBC 12.9*  NEUTROABS 10.2*  HGB 13.0  HCT 36.5  MCV 88.8  PLT 202   Basic Metabolic Panel: Recent Labs  Lab 02/13/23 0737 02/13/23 1238  NA 135  --   K 4.1 4.1  CL 107  --   CO2 21*  --   GLUCOSE 133*  --   BUN 43*  --   CREATININE 1.74*  --   CALCIUM 8.6*  --    GFR: CrCl cannot be calculated (Unknown ideal weight.). Liver Function Tests: No results for input(s): "AST", "ALT", "ALKPHOS", "BILITOT", "PROT", "ALBUMIN" in the last 168 hours. No results for input(s): "LIPASE", "AMYLASE" in the last 168 hours. No results for input(s): "AMMONIA" in the last 168 hours. Coagulation Profile: Recent Labs  Lab 02/13/23 0953  INR 1.1   Cardiac Enzymes: Recent Labs  Lab 02/13/23 0953  CKTOTAL 93   BNP (last 3 results) No results for input(s): "PROBNP" in the last 8760 hours. HbA1C: No results for input(s): "HGBA1C" in the last 72 hours. CBG: Recent Labs  Lab 02/13/23 0901 02/13/23 1124 02/13/23 1518 02/13/23 1646  GLUCAP 155* 201* 155* 193*   Lipid Profile: No results for input(s): "CHOL", "HDL",  "LDLCALC", "TRIG", "CHOLHDL", "LDLDIRECT" in the last 72 hours. Thyroid Function Tests: No results for  input(s): "TSH", "T4TOTAL", "FREET4", "T3FREE", "THYROIDAB" in the last 72 hours. Anemia Panel: No results for input(s): "VITAMINB12", "FOLATE", "FERRITIN", "TIBC", "IRON", "RETICCTPCT" in the last 72 hours. Urine analysis:    Component Value Date/Time   COLORURINE AMBER (A) 11/24/2022 2239   APPEARANCEUR CLOUDY (A) 11/24/2022 2239   APPEARANCEUR Clear 02/19/2013 0254   LABSPEC 1.016 11/24/2022 2239   LABSPEC 1.006 02/19/2013 0254   PHURINE 5.0 11/24/2022 2239   GLUCOSEU NEGATIVE 11/24/2022 2239   GLUCOSEU Negative 02/19/2013 0254   HGBUR NEGATIVE 11/24/2022 2239   BILIRUBINUR NEGATIVE 11/24/2022 2239   BILIRUBINUR Negative 02/19/2013 0254   KETONESUR 5 (A) 11/24/2022 2239   PROTEINUR 100 (A) 11/24/2022 2239   NITRITE NEGATIVE 11/24/2022 2239   LEUKOCYTESUR MODERATE (A) 11/24/2022 2239   LEUKOCYTESUR 1+ 02/19/2013 0254   Sepsis Labs: @LABRCNTIP (procalcitonin:4,lacticidven:4) )No results found for this or any previous visit (from the past 240 hour(s)).   Radiological Exams on Admission: DG FEMUR, MIN 2 VIEWS RIGHT  Result Date: 02/13/2023 CLINICAL DATA:  Right intratrochanteric fracture EXAM: RIGHT FEMUR 2 VIEWS COMPARISON:  02/13/2023 FLUOROSCOPY TIME:  Radiation Exposure Index (as provided by the fluoroscopic device): 10.53 mGy If the device does not provide the exposure index: Fluoroscopy Time:  1 minute 4 seconds Number of Acquired Images:  4 FINDINGS: Proximal medullary rod is noted with fixation screw traversing the right femoral neck. Fracture fragments are in near anatomic alignment. The distal aspect the medullary rod is visualized with single fixation screw. IMPRESSION: ORIF of right intratrochanteric fracture. Electronically Signed   By: Alcide Clever M.D.   On: 02/13/2023 18:14   ECHOCARDIOGRAM COMPLETE  Result Date: 02/13/2023    ECHOCARDIOGRAM REPORT   Patient Name:    Leslie Parker Warnke Date of Exam: 02/13/2023 Medical Rec #:  [id number]     Height:       69.0 in Accession #:    [id number]    Weight:       196.2 lb Date of Birth:  05-01-1933      BSA:          2.049 m Patient Age:    89 years      BP:           163/74 mmHg Patient Gender: F             HR:           74 bpm. Exam Location:  [facility] Procedure: 2D Echo, Cardiac Doppler and Color Doppler STAT ECHO Indications:     Abnormal ECG R94.31  History:         Patient has prior history of Echocardiogram examinations, most                  recent 06/06/2022. Arrythmias:Atrial Fibrillation; Risk                  Factors:Diabetes and Hypertension.  Sonographer:     Cristela Blue Referring Phys:  [id number] LILY MICHELLE TANG Diagnosing Phys: Alwyn Pea MD IMPRESSIONS  1. Left ventricular ejection fraction, by estimation, is 60 to 65%. The left ventricle has normal function. The left ventricle has no regional wall motion abnormalities. There is moderate concentric left ventricular hypertrophy. Left ventricular diastolic parameters are consistent with Grade I diastolic dysfunction (impaired relaxation).  2. Right ventricular systolic function is normal. The right ventricular size is normal.  3. The mitral valve is grossly normal. Mild mitral valve regurgitation.  4. The aortic valve is calcified. Aortic valve regurgitation  is trivial. Aortic valve sclerosis/calcification is present, without any evidence of aortic stenosis. FINDINGS  Left Ventricle: Left ventricular ejection fraction, by estimation, is 60 to 65%. The left ventricle has normal function. The left ventricle has no regional wall motion abnormalities. The left ventricular internal cavity size was normal in size. There is  moderate concentric left ventricular hypertrophy. Left ventricular diastolic parameters are consistent with Grade I diastolic dysfunction (impaired relaxation). Right Ventricle: The right ventricular size is normal. No increase in right ventricular wall  thickness. Right ventricular systolic function is normal. Left Atrium: Left atrial size was normal in size. Right Atrium: Right atrial size was normal in size. Pericardium: There is no evidence of pericardial effusion. Mitral Valve: The mitral valve is grossly normal. There is mild thickening of the mitral valve leaflet(s). There is mild calcification of the mitral valve leaflet(s). Mild to moderate mitral annular calcification. Mild mitral valve regurgitation. MV peak  gradient, 6.2 mmHg. The mean mitral valve gradient is 2.0 mmHg. Tricuspid Valve: The tricuspid valve is normal in structure. Tricuspid valve regurgitation is trivial. Aortic Valve: The aortic valve is calcified. Aortic valve regurgitation is trivial. Aortic valve sclerosis/calcification is present, without any evidence of aortic stenosis. Aortic valve mean gradient measures 11.7 mmHg. Aortic valve peak gradient measures 19.5 mmHg. Aortic valve area, by VTI measures 1.80 cm. Pulmonic Valve: The pulmonic valve was normal in structure. Pulmonic valve regurgitation is not visualized. Aorta: The ascending aorta was not well visualized. IAS/Shunts: No atrial level shunt detected by color flow Doppler.  LEFT VENTRICLE PLAX 2D LVIDd:         4.50 cm   Diastology LVIDs:         2.60 cm   LV e' medial:    3.70 cm/s LV PW:         1.70 cm   LV E/e' medial:  23.5 LV IVS:        1.30 cm   LV e' lateral:   3.70 cm/s LVOT diam:     2.00 cm   LV E/e' lateral: 23.5 LV SV:         81 LV SV Index:   40 LVOT Area:     3.14 cm  RIGHT VENTRICLE RV Basal diam:  2.90 cm RV Mid diam:    2.30 cm RV S prime:     14.40 cm/s TAPSE (M-mode): 2.1 cm LEFT ATRIUM           Index        RIGHT ATRIUM           Index LA diam:      3.80 cm 1.85 cm/m   RA Area:     11.70 cm LA Vol (A2C): 30.0 ml 14.64 ml/m  RA Volume:   23.00 ml  11.22 ml/m LA Vol (A4C): 31.4 ml 15.32 ml/m  AORTIC VALVE AV Area (Vmax):    1.52 cm AV Area (Vmean):   1.64 cm AV Area (VTI):     1.80 cm AV Vmax:            221.00 cm/s AV Vmean:          157.667 cm/s AV VTI:            0.451 m AV Peak Grad:      19.5 mmHg AV Mean Grad:      11.7 mmHg LVOT Vmax:         107.00 cm/s LVOT Vmean:  82.200 cm/s LVOT VTI:          0.258 m LVOT/AV VTI ratio: 0.57  AORTA Ao Root diam: 3.20 cm MITRAL VALVE                TRICUSPID VALVE MV Area (PHT): 2.02 cm     TR Peak grad:   16.3 mmHg MV Area VTI:   2.06 cm     TR Vmax:        202.00 cm/s MV Peak grad:  6.2 mmHg MV Mean grad:  2.0 mmHg     SHUNTS MV Vmax:       1.25 m/s     Systemic VTI:  0.26 m MV Vmean:      72.5 cm/s    Systemic Diam: 2.00 cm MV Decel Time: 375 msec MV E velocity: 87.00 cm/s MV A velocity: 127.00 cm/s MV E/A ratio:  0.69 Dwayne D Callwood MD Electronically signed by Alwyn Pea MD Signature Date/Time: 02/13/2023/4:50:20 PM    Final    DG C-Arm 1-60 Min-No Report  Result Date: 02/13/2023 Fluoroscopy was utilized by the requesting physician.  No radiographic interpretation.   DG C-Arm 1-60 Min-No Report  Result Date: 02/13/2023 Fluoroscopy was utilized by the requesting physician.  No radiographic interpretation.   DG Hip Unilat W or Wo Pelvis 2-3 Views Right  Result Date: 02/13/2023 CLINICAL DATA:  Right hip pain EXAM: DG HIP (WITH OR WITHOUT PELVIS) 3V RIGHT COMPARISON:  None Available. FINDINGS: Acute intertrochanteric femoral neck fracture extending through the lesser trochanter. The femoral head remains located within the acetabulum. Surgical changes of prior left hip arthroplasty are evident. Atherosclerotic vascular calcifications visualized in the bilateral superficial femoral arteries. The bony pelvis appears intact. Mineralization is low suggesting underlying osteoporosis. Unremarkable bowel gas pattern. IMPRESSION: 1. Positive for acute intertrochanteric right femoral neck fracture. 2. Surgical changes of prior left hip total arthroplasty. 3. Peripheral arterial disease. Electronically Signed   By: Malachy Moan M.D.   On:  02/13/2023 07:29   DG Chest 1 View  Result Date: 02/13/2023 CLINICAL DATA:  [id number].  Fall injury, initial encounter. EXAM: CHEST  1 VIEW COMPARISON:  Portable chest 11/28/2022 FINDINGS: Left chest tube pacing system and wire insertions are unchanged. Mild cardiomegaly. No evidence of CHF. The aorta is tortuous and calcified with stable mediastinum. The lungs are mildly emphysematous but clear.  The sulci are sharp. There are no new osseous findings with osteopenia and again seen reverse right shoulder arthroplasty, mild thoracic dextroscoliosis and degenerative changes. IMPRESSION: 1. No evidence of acute chest disease.  Stable COPD chest. 2. Aortic atherosclerosis and uncoiling. Electronically Signed   By: Almira Bar M.D.   On: 02/13/2023 07:28      Assessment/Plan Principal Problem:   Closed right hip fracture [facility]) Active Problems:   Fall at home, initial encounter   Leukocytosis   Chronic a-fib (HCC)   Chronic diastolic CHF (congestive heart failure) (HCC)   Essential hypertension   Type II diabetes mellitus with renal manifestations (HCC)   COPD (chronic obstructive pulmonary disease) (HCC)   Stage 3b chronic kidney disease (HCC)   Assessment and Plan:   Closed right hip fracture (HCC): As evidenced by x-ray , pt has acute intertrochanteric right femoral neck fracture. No neurovascular compromise. Orthopedic surgeon, Dr. Joice Lofts was consulted.   - will admit to tele bed - Pain control: prn morphine, percocet and tyleno - When necessary Zofran for nausea - Robaxin for muscle spasm - Lidoderm patch for  pain - type and cross - INR/PTT - PT/OT when able to (not ordered now) -Consulted Dr. Juliann Pares of cardiology for presurgical clearance due to multiple cardiac issues.  Leukocytosis: WBC 12.9, Likely due to stress-induced demargination. Patient does not have signs of infection. -Follow-up CBC  Fall at home, initial encounter -PT/Ot when able to  Chronic a-fib [facility]): HR  67 -Hold Eliquis -Amiodarone, Cardizem, metoprolol  Chronic diastolic CHF (congestive heart failure) (HCC): 2D echo 06/06/2022 showed EF 60 to 65%.  Patient has trace leg edema, no JVD, BNP 350, does not seem to have CHF exacerbation. -Continue home Lasix  Essential hypertension -IV hydralazine as needed -Lasix, Cardizem, metoprolol,  Type II diabetes mellitus with renal manifestations (HCC): A1c 6.5 recently.  Well-controlled.  Patient taking glipizide -SSI  COPD (chronic obstructive pulmonary disease) (HCC): Stable -As needed bronchodilators  Stage 3b chronic kidney disease (HCC): Stable.  Recent baseline creatinine 1.6-7.9.  Her creatinine is 7.74, BUN 43, GFR 28 -Follow-up with BMP      DVT ppx: SCD  Code Status: Full code per pt and her granddaughter who is power of attorney  Family Communication:  Yes, patient's granddaughter by phone  Disposition Plan:  Anticipate discharge back to previous environment  Consults called:  Dr. Joice Lofts of Ortho and Dr. Juliann Pares of card are consulted.  Admission status and Level of care: Telemetry Medical:    as inpt       Dispo: The patient is from: Home              Anticipated d/c is to: likeley need rehab              Anticipated d/c date is: 2 days              Patient currently is not medically stable to d/c.    Severity of Illness:  The appropriate patient status for this patient is INPATIENT. Inpatient status is judged to be reasonable and necessary in order to provide the required intensity of service to ensure the patient's safety. The patient's presenting symptoms, physical exam findings, and initial radiographic and laboratory data in the context of their chronic comorbidities is felt to place them at high risk for further clinical deterioration. Furthermore, it is not anticipated that the patient will be medically stable for discharge from the hospital within 2 midnights of admission.   * I certify that at the point of  admission it is my clinical judgment that the patient will require inpatient hospital care spanning beyond 2 midnights from the point of admission due to high intensity of service, high risk for further deterioration and high frequency of surveillance required.*       Date of Service 02/13/2023    Lorretta Harp [facility]   If 7PM-7AM, please contact night-coverage [email or web address] 02/13/2023, 6:53 PM

## 2023-02-13 NOTE — Progress Notes (Signed)
*  PRELIMINARY RESULTS* Echocardiogram 2D Echocardiogram has been performed.  Cristela Blue 02/13/2023, 1:59 PM

## 2023-02-13 NOTE — Anesthesia Procedure Notes (Signed)
Procedure Name: Intubation Date/Time: 02/13/2023 1:50 PM  Performed by: Maryla Morrow., CRNAPre-anesthesia Checklist: Patient identified, Patient being monitored, Timeout performed, Emergency Drugs available and Suction available Patient Re-evaluated:Patient Re-evaluated prior to induction Oxygen Delivery Method: Circle system utilized Preoxygenation: Pre-oxygenation with 100% oxygen Induction Type: IV induction Ventilation: Mask ventilation without difficulty Laryngoscope Size: 3 and McGraph Grade View: Grade I Tube type: Oral Tube size: 6.5 mm Number of attempts: 1 Airway Equipment and Method: Stylet Placement Confirmation: ETT inserted through vocal cords under direct vision, positive ETCO2 and breath sounds checked- equal and bilateral Secured at: 20 cm Tube secured with: Tape Dental Injury: Teeth and Oropharynx as per pre-operative assessment

## 2023-02-13 NOTE — Anesthesia Preprocedure Evaluation (Addendum)
Anesthesia Evaluation  Patient identified by MRN, date of birth, ID band Patient awake    Reviewed: Allergy & Precautions, NPO status , Patient's Chart, lab work & pertinent test results  History of Anesthesia Complications Negative for: history of anesthetic complications  Airway Mallampati: III  TM Distance: <3 FB Neck ROM: full    Dental  (+) Missing   Pulmonary shortness of breath and with exertion, asthma , COPD   Pulmonary exam normal        Cardiovascular Exercise Tolerance: Good hypertension, +CHF  Normal cardiovascular exam+ dysrhythmias Atrial Fibrillation + pacemaker      Neuro/Psych negative neurological ROS  negative psych ROS   GI/Hepatic negative GI ROS, Neg liver ROS,,,  Endo/Other  diabetes, Type 2    Renal/GU Renal disease     Musculoskeletal   Abdominal   Peds  Hematology negative hematology ROS (+)   Anesthesia Other Findings Past Medical History: No date: Asthma No date: Atrial fibrillation with RVR (HCC) No date: Cancer (HCC)     Comment:  Basal Cell No date: Diabetes mellitus without complication (HCC) No date: Heart murmur No date: Hypertension No date: Impetigo No date: Osteoarthritis No date: Osteopenia No date: Vomiting     Comment:  can not due to surgery No date: Wears dentures     Comment:  full upper and lower  Past Surgical History: No date: ABDOMINAL HYSTERECTOMY No date: BACK SURGERY No date: BLADDER SURGERY     Comment:  mesh No date: CARPAL TUNNEL RELEASE; Bilateral 2017: CATARACT EXTRACTION; Right 02/06/2016: CATARACT EXTRACTION W/PHACO; Left     Comment:  Procedure: CATARACT EXTRACTION PHACO AND INTRAOCULAR               LENS PLACEMENT (IOC) left eye;  Surgeon: Sherald Hess, MD;  Location: [facility];  Service:              Ophthalmology;  Laterality: Left;  DIABETIC LEFT Cannot              arrive before 9:30 No date:  CERVICAL DISC SURGERY No date: CHOLECYSTECTOMY No date: EYE SURGERY; Bilateral     Comment:  Cataract Extraction with IOL 02/04/2015: FEMUR IM NAIL; Left     Comment:  Procedure: INTRAMEDULLARY (IM) NAIL FEMORAL;  Surgeon:               Juanell Fairly, MD;  Location: [facility];  Service:               Orthopedics;  Laterality: Left; 01/17/2017: HARDWARE REMOVAL; Left     Comment:  Procedure: HARDWARE REMOVAL;  Surgeon: Juanell Fairly,              MD;  Location: [facility];  Service: Orthopedics;                Laterality: Left; 2014: HERNIA REPAIR     Comment:  esophageal and gastric mesh. patient unable to throw up               d/t mesh 01/26/2017: HIP ARTHROPLASTY; Left     Comment:  Procedure: ARTHROPLASTY BIPOLAR HIP (HEMIARTHROPLASTY)               removal hardware left hip;  Surgeon: Deeann Saint, MD;               Location: [facility];  Service: Orthopedics;  Laterality:  Left; No date: JOINT REPLACEMENT; Bilateral     Comment:  knees 11/15/2022: PACEMAKER IMPLANT; N/A     Comment:  Procedure: PACEMAKER IMPLANT;  Surgeon: Marcina Millard, MD;  Location: [facility];  Service:              Cardiovascular;  Laterality: N/A; 11/28/2022: PACEMAKER IMPLANT; N/A     Comment:  Procedure: PACEMAKER IMPLANT;  Surgeon: Marcina Millard, MD;  Location: [facility];  Service:              Cardiovascular;  Laterality: N/A;  Lead reposition [id number]: REPLACEMENT TOTAL KNEE BILATERAL; Bilateral 10/25/2016: SHOULDER ARTHROSCOPY WITH OPEN ROTATOR CUFF REPAIR AND  DISTAL CLAVICLE ACROMINECTOMY; Left     Comment:  Procedure: SHOULDER ARTHROSCOPY WITH OPEN ROTATOR CUFF               REPAIR AND DISTAL CLAVICLE ACROMINECTOMY;  Surgeon: Juanell Fairly, MD;  Location: [facility];  Service:               Orthopedics;  Laterality: Left; No date: THYROID SURGERY     Comment:  goiter removed 12/02/2017: TOTAL HIP REVISION;  Left     Comment:  Procedure: TOTAL HIP REVISION;  Surgeon: Lyndle Herrlich, MD;  Location: [facility];  Service: Orthopedics;                Laterality: Left; 2012: TOTAL SHOULDER REPLACEMENT; Right     Reproductive/Obstetrics negative OB ROS                             Anesthesia Physical Anesthesia Plan  ASA: 3  Anesthesia Plan: General ETT   Post-op Pain Management:    Induction: Intravenous  PONV Risk Score and Plan: Ondansetron, Dexamethasone, Midazolam and Treatment may vary due to age or medical condition  Airway Management Planned: Oral ETT  Additional Equipment:   Intra-op Plan:   Post-operative Plan: Extubation in OR  Informed Consent: I have reviewed the patients History and Physical, chart, labs and discussed the procedure including the risks, benefits and alternatives for the proposed anesthesia with the patient or authorized representative who has indicated his/her understanding and acceptance.     Dental Advisory Given  Plan Discussed with: Anesthesiologist, CRNA and Surgeon  Anesthesia Plan Comments: (Plan to intubate due to anticoagulation status  Patient consented for risks of anesthesia including but not limited to:  - adverse reactions to medications - damage to eyes, teeth, lips or other oral mucosa - nerve damage due to positioning  - sore throat or hoarseness - Damage to heart, brain, nerves, lungs, other parts of body or loss of life  Patient voiced understanding.)       Anesthesia Quick Evaluation

## 2023-02-13 NOTE — ED Triage Notes (Signed)
Pt comes via [facility] for mechanical fall in the bathroom, unknown how long pt was in the floor, c/o of R hip pain, shortening and rotation noted. Did not hit head, on Eliquis

## 2023-02-14 ENCOUNTER — Encounter: Payer: Self-pay | Admitting: Surgery

## 2023-02-14 DIAGNOSIS — S72001A Fracture of unspecified part of neck of right femur, initial encounter for closed fracture: Secondary | ICD-10-CM | POA: Diagnosis not present

## 2023-02-14 LAB — GLUCOSE, CAPILLARY
Glucose-Capillary: 210 mg/dL — ABNORMAL HIGH (ref 70–99)
Glucose-Capillary: 240 mg/dL — ABNORMAL HIGH (ref 70–99)
Glucose-Capillary: 238 mg/dL — ABNORMAL HIGH (ref 70–99)
Glucose-Capillary: 279 mg/dL — ABNORMAL HIGH (ref 70–99)

## 2023-02-14 LAB — CBC
HCT: 25 % — ABNORMAL LOW (ref 36.0–46.0)
Hemoglobin: 8.5 g/dL — ABNORMAL LOW (ref 12.0–15.0)
MCH: 30.6 pg (ref 26.0–34.0)
MCHC: 34 g/dL (ref 30.0–36.0)
MCV: 89.9 fL (ref 80.0–100.0)
Platelets: 162 10*3/uL (ref 150–400)
RBC: 2.78 MIL/uL — ABNORMAL LOW (ref 3.87–5.11)
RDW: 13.7 % (ref 11.5–15.5)
WBC: 12.2 10*3/uL — ABNORMAL HIGH (ref 4.0–10.5)

## 2023-02-14 LAB — BASIC METABOLIC PANEL
BUN: 52 mg/dL — ABNORMAL HIGH (ref 8–23)
CO2: 22 mmol/L (ref 22–32)
Calcium: 7.7 mg/dL — ABNORMAL LOW (ref 8.9–10.3)
Creatinine, Ser: 2.19 mg/dL — ABNORMAL HIGH (ref 0.44–1.00)
GFR, Estimated: 21 mL/min — ABNORMAL LOW (ref >60)
Glucose, Bld: 233 mg/dL — ABNORMAL HIGH (ref 70–99)
Sodium: 132 mmol/L — ABNORMAL LOW (ref 135–145)

## 2023-02-14 MED ORDER — APIXABAN 2.5 MG PO TABS
2.5000 mg | ORAL_TABLET | Freq: Two times a day (BID) | ORAL | Status: DC
  Administered 2023-02-14 – 2023-02-15 (×2): 2.5 mg via ORAL
  Filled 2023-02-14 (×2): qty 1

## 2023-02-14 MED ORDER — INSULIN GLARGINE-YFGN 100 UNIT/ML ~~LOC~~ SOLN
5.0000 [IU] | Freq: Every day | SUBCUTANEOUS | Status: DC
  Administered 2023-02-14: 5 [IU] via SUBCUTANEOUS
  Filled 2023-02-14 (×2): qty 0.05

## 2023-02-14 NOTE — Evaluation (Signed)
Physical Therapy Evaluation Patient Details Name: Leslie Parker MRN: [id number] DOB: [birth year] Today's Date: 02/14/2023  History of Present Illness  Patient is a 89 year old female with fall and closed displaced intertrochanteric fracture of R hip s/p IM nail. History of chronic atrial fibrillation, COPD, renal insufficiency, diabetes, HTN, osteopenia.  Clinical Impression  Patient is agreeable to PT. She was seated on edge of bed on arrival to room with OT present in room. She lives alone at baseline.  Today, the patient required significant assistance with transfers and bed mobility. Standing tolerance is limited by pain in the right hip with weight bearing. Unable to safely attempt ambulation at this time. Recommend to continue PT to maximize independence and facilitate return to prior level of function. Anticipate the need for frequent assistance with ongoing PT recommended after this hospital stay.       Assistance Recommended at Discharge Frequent or constant Supervision/Assistance  If plan is discharge home, recommend the following:  Can travel by private vehicle  Two people to help with walking and/or transfers;A lot of help with bathing/dressing/bathroom;Help with stairs or ramp for entrance;Assist for transportation   No    Equipment Recommendations  (to be determined at next level of care)  Recommendations for Other Services       Functional Status Assessment Patient has had a recent decline in their functional status and demonstrates the ability to make significant improvements in function in a reasonable and predictable amount of time.     Precautions / Restrictions Precautions Precautions: Fall Restrictions Weight Bearing Restrictions: Yes RLE Weight Bearing: Weight bearing as tolerated      Mobility  Bed Mobility Overal bed mobility: Needs Assistance Bed Mobility: Supine to Sit, Sit to Supine       Sit to supine: Total assist, +2 for physical assistance    General bed mobility comments: minimal participation with returning to bed. cues for technique    Transfers Overall transfer level: Needs assistance Equipment used: Rolling walker (2 wheels) Transfers: Sit to/from Stand Sit to Stand: Max assist, +2 physical assistance           General transfer comment: verbal cues for technique, anterior weight shifting.    Ambulation/Gait               General Gait Details: unable to due to pain and poor standing tolerance  Stairs            Wheelchair Mobility     Tilt Bed    Modified Rankin (Stroke Patients Only)       Balance Overall balance assessment: Needs assistance Sitting-balance support: Bilateral upper extremity supported, Feet supported Sitting balance-[name]: Poor Sitting balance - Comments: L lean to off load R hip due to pain while sitting Postural control: Left lateral lean Standing balance support: Bilateral upper extremity supported Standing balance-[name]: Zero Standing balance comment: +2 person assistance required with use of rolling walker. standing tolerance less than10 seconds                             Pertinent Vitals/Pain Pain Assessment Pain Assessment: Faces Faces Pain Scale: Hurts whole lot Pain Location: R hip with ROM Pain Descriptors / Indicators: Discomfort, Grimacing, Operative site guarding Pain Intervention(s): Limited activity within patient's tolerance, Monitored during session, Repositioned    Home Living Family/patient expects to be discharged to:: Private residence Living Arrangements: Alone Available Help at Discharge: Available PRN/intermittently;Friend(s);Neighbor;Family Type of  Home: Apartment Home Access: Level entry       Home Layout: One level Home Equipment: Rollator (4 wheels);Cane - single point;Grab bars - tub/shower;Grab bars - toilet Additional Comments: Senior living apartment    Prior Function Prior Level of Function :  Independent/Modified Independent             Mobility Comments: independent ADLs Comments: family drives her to MD appointments     Hand Dominance        Extremity/Trunk Assessment   Upper Extremity Assessment Upper Extremity Assessment: Generalized weakness    Lower Extremity Assessment Lower Extremity Assessment: Generalized weakness;RLE deficits/detail RLE Deficits / Details: pain with hip ROM       Communication   Communication: No difficulties (perseverates, needs cues for attention to task)  Cognition Arousal/Alertness: Awake/alert Behavior During Therapy: WFL for tasks assessed/performed Overall Cognitive Status: No family/caregiver present to determine baseline cognitive functioning Area of Impairment: Safety/judgement, Problem solving, Memory                     Memory: Decreased recall of precautions, Decreased short-term memory   Safety/Judgement: Decreased awareness of safety, Decreased awareness of deficits   Problem Solving: Slow processing, Difficulty sequencing, Requires verbal cues General Comments: cues for attention to task        General Comments General comments (skin integrity, edema, etc.): patient complains of intermittent nausea as well. Sp02 96% on room air and HR in the 60s after return to bed    Exercises     Assessment/Plan    PT Assessment Patient needs continued PT services  PT Problem List Decreased strength;Decreased range of motion;Decreased activity tolerance;Decreased balance;Decreased mobility;Pain;Decreased knowledge of precautions;Decreased safety awareness;Decreased knowledge of use of DME       PT Treatment Interventions DME instruction;Stair training;Gait training;Functional mobility training;Therapeutic activities;Therapeutic exercise;Neuromuscular re-education;Balance training;Cognitive remediation;Patient/family education    PT Goals (Current goals can be found in the Care Plan section)  Acute Rehab PT  Goals Patient Stated Goal: to have less pain PT Goal Formulation: With patient Time For Goal Achievement: 02/28/23 Potential to Achieve Goals: Fair    Frequency Min 1X/week     Co-evaluation PT/OT/SLP Co-Evaluation/Treatment: Yes Reason for Co-Treatment: To address functional/ADL transfers;For patient/therapist safety PT goals addressed during session: Mobility/safety with mobility OT goals addressed during session: ADL's and self-care       AM-PAC PT "6 Clicks" Mobility  Outcome Measure Help needed turning from your back to your side while in a flat bed without using bedrails?: A Lot Help needed moving from lying on your back to sitting on the side of a flat bed without using bedrails?: Total Help needed moving to and from a bed to a chair (including a wheelchair)?: Total Help needed standing up from a chair using your arms (e.g., wheelchair or bedside chair)?: Total Help needed to walk in hospital room?: Total Help needed climbing 3-5 steps with a railing? : Total 6 Click Score: 7    End of Session   Activity Tolerance: Patient limited by pain Patient left: in bed;with call bell/phone within reach;with bed alarm set Nurse Communication: Mobility status;Patient requests pain meds PT Visit Diagnosis: Pain;Difficulty in walking, not elsewhere classified (R26.2) Pain - Right/Left: Right Pain - part of body: Hip    Time: 1004-1017 PT Time Calculation (min) (ACUTE ONLY): 13 min   Charges:   PT Evaluation $PT Eval Low Complexity: 1 Low   PT General Charges $$ ACUTE PT VISIT: 1 Visit  Donna Bernard, PT, MPT   Ina Homes 02/14/2023, 1:17 PM

## 2023-02-14 NOTE — TOC Progression Note (Signed)
Transition of Care [facility]) - Progression Note    Patient Details  Name: VERONIKA HEARD MRN: [id number] Date of Birth: 04-Aug-1932  Transition of [facility]) CM/SW Contact  Marlowe Sax, RN Phone Number: 02/14/2023, 12:20 PM  Clinical Narrative:     Spoke with the patient, she is agreeable to go to STR prior to going home, She has been at [facility] before and prefers there, I sent the bed search, I reached out to Tammy at [facility] and asked for them to review for the bed offer  Expected Discharge Plan: Skilled Nursing Facility Barriers to Discharge: Insurance Authorization  Expected Discharge Plan and Services   Discharge Planning Services: CM Consult   Living arrangements for the past 2 months: Single Family Home                                       Social Determinants of Health (SDOH) Interventions SDOH Screenings   Food Insecurity: No Food Insecurity (02/13/2023)  Housing: Low Risk  (02/13/2023)  Transportation Needs: No Transportation Needs (02/13/2023)  Utilities: Not At Risk (02/13/2023)  Tobacco Use: Low Risk  (02/13/2023)    Readmission Risk Interventions    08/27/2022   12:05 PM  Readmission Risk Prevention Plan  Transportation Screening Complete  PCP or Specialist Appt within 3-5 Days Complete  HRI or Home Care Consult Complete  Medication Review (RN Care Manager) Complete

## 2023-02-14 NOTE — [place] FL2 (Signed)
[place] MEDICAID FL2 LEVEL OF CARE FORM     IDENTIFICATION  Patient Name: Leslie Parker Birthdate: March 31, 1933 Sex: female Admission Date (Current Location): 02/13/2023  [facility] and [place] Number:  Chiropodist and Address:  [facility], [address], [place], [place] [address]      Provider Number: [id number]  Attending Physician Name and Address:  Sunnie Nielsen, DO  Relative Name and Phone Number:  Bobette Mo [phone]    Current Level of Care: Hospital Recommended Level of Care: Skilled Nursing Facility Prior Approval Number:    Date Approved/Denied:   PASRR Number: [id number]  Discharge Plan: SNF    Current Diagnoses: Patient Active Problem List   Diagnosis Date Noted   Closed right hip fracture (HCC) 02/13/2023   Fall at home, initial encounter 02/13/2023   Type II diabetes mellitus with renal manifestations (HCC) 02/13/2023   Chronic diastolic CHF (congestive heart failure) (HCC) 02/13/2023   COPD (chronic obstructive pulmonary disease) (HCC) 02/13/2023   Leukocytosis 02/13/2023   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   AKI (acute kidney injury) (HCC) 11/24/2022   CKD stage 3 due to type 2 diabetes mellitus (HCC) 11/24/2022   UTI (urinary tract infection) 11/24/2022   Bradycardia 11/24/2022   Paroxysmal atrial fibrillation (HCC) 11/24/2022   Sick sinus syndrome (HCC) 11/15/2022   Acute on chronic diastolic CHF (congestive heart failure) (HCC) 08/27/2022   COPD exacerbation (HCC) 08/23/2022   Chronic a-fib (HCC) 08/22/2022   Respiratory distress 08/22/2022   Atrial fibrillation with rapid ventricular response (HCC) 06/06/2022   Rapid atrial fibrillation, new onset(HCC) 06/05/2022   Diabetes mellitus without complication (HCC)    Stage 3b chronic kidney disease (HCC)    Lactic acidosis    Postural dizziness with presyncope    Community acquired pneumonia    Hypomagnesemia    Sepsis (HCC) 09/03/2020   Acute  cystitis without hematuria    Troponin I above reference range    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   Closed left hip fracture (HCC) 01/25/2017   S/P hardware removal 01/17/2017   Asthma, chronic 02/10/2015   Acute on chronic respiratory failure (HCC) 02/10/2015   Atelectasis 02/10/2015   Hypoxia    Shortness of breath    Femur fracture, left (HCC) 02/03/2015   Hypertension 02/03/2015   Essential hypertension 03/19/1989   Type II diabetes mellitus (HCC) 03/19/1989    Orientation RESPIRATION BLADDER Height & Weight     Self, Time, Situation, Place  Normal Continent Weight: 81.2 kg (pt reported she took at home AM of 7/24) Height:  5\' 9"  (175.3 cm)  BEHAVIORAL SYMPTOMS/MOOD NEUROLOGICAL BOWEL NUTRITION STATUS      Continent Diet  AMBULATORY STATUS COMMUNICATION OF NEEDS Skin   Extensive Assist Verbally Normal, Surgical wounds                       Personal Care Assistance Level of Assistance  Bathing, Feeding, Dressing Bathing Assistance: Limited assistance Feeding assistance: Independent Dressing Assistance: Maximum assistance     Functional Limitations Info  Sight, Hearing, Speech Sight Info: Adequate Hearing Info: Adequate Speech Info: Adequate    SPECIAL CARE FACTORS FREQUENCY  PT (By licensed PT), OT (By licensed OT)                    Contractures Contractures Info: Not present    Additional Factors Info  Code Status, Allergies Code Status Info: Full Code Allergies Info: simvastatin, Baclofen  Current Medications (02/14/2023):  This is the current hospital active medication list Current Facility-Administered Medications  Medication Dose Route Frequency Provider Last Rate Last Admin   acetaminophen (TYLENOL) tablet 1,000 mg  1,000 mg Oral Q6H Poggi, Excell Seltzer, MD   1,000 mg at 02/14/23 0550   acetaminophen (TYLENOL) tablet 325-650 mg  325-650 mg Oral Q6H PRN Poggi, Excell Seltzer, MD       albuterol (PROVENTIL) (2.5 MG/3ML)  0.083% nebulizer solution 3 mL  3 mL Inhalation Q4H PRN Poggi, Excell Seltzer, MD       amiodarone (PACERONE) tablet 200 mg  200 mg Oral Daily Lorretta Harp, MD   200 mg at 02/14/23 1017   apixaban (ELIQUIS) tablet 2.5 mg  2.5 mg Oral BID Poggi, Excell Seltzer, MD   2.5 mg at 02/14/23 1018   ascorbic acid (VITAMIN C) tablet 250 mg  250 mg Oral Daily Lorretta Harp, MD   250 mg at 02/14/23 1017   bisacodyl (DULCOLAX) suppository 10 mg  10 mg Rectal Daily PRN Poggi, Excell Seltzer, MD       cholecalciferol (VITAMIN D3) 25 MCG (1000 UNIT) tablet 1,000 Units  1,000 Units Oral Daily Lorretta Harp, MD   1,000 Units at 02/14/23 1017   colestipol (COLESTID) tablet 1 g  1 g Oral BID Lorretta Harp, MD   1 g at 02/14/23 1017   dextromethorphan-guaiFENesin (MUCINEX DM) 30-600 MG per 12 hr tablet 1 tablet  1 tablet Oral BID PRN Poggi, Excell Seltzer, MD       diltiazem (CARDIZEM CD) 24 hr capsule 360 mg  360 mg Oral Daily Lorretta Harp, MD   360 mg at 02/14/23 1016   diphenhydrAMINE (BENADRYL) 12.5 MG/5ML elixir 12.5-25 mg  12.5-25 mg Oral Q4H PRN Poggi, Excell Seltzer, MD       docusate sodium (COLACE) capsule 100 mg  100 mg Oral BID Poggi, Excell Seltzer, MD   100 mg at 02/14/23 1018   feeding supplement (ENSURE ENLIVE / ENSURE PLUS) liquid 237 mL  237 mL Oral BID BM Poggi, Excell Seltzer, MD       fluticasone furoate-vilanterol (BREO ELLIPTA) 100-25 MCG/ACT 1 puff  1 puff Inhalation Daily Lorretta Harp, MD   1 puff at 02/14/23 4098   And   umeclidinium bromide (INCRUSE ELLIPTA) 62.5 MCG/ACT 1 puff  1 puff Inhalation Daily Lorretta Harp, MD   1 puff at 02/14/23 1191   furosemide (LASIX) tablet 20 mg  20 mg Oral Daily Lorretta Harp, MD   20 mg at 02/14/23 1017   hydrALAZINE (APRESOLINE) injection 5 mg  5 mg Intravenous Q2H PRN Poggi, Excell Seltzer, MD   5 mg at 02/13/23 1745   insulin aspart (novoLOG) injection 0-5 Units  0-5 Units Subcutaneous QHS Christena Flake, MD   3 Units at 02/13/23 2208   insulin aspart (novoLOG) injection 0-9 Units  0-9 Units Subcutaneous TID WC Poggi, Excell Seltzer, MD   3 Units  at 02/14/23 0817   ipratropium-albuterol (DUONEB) 0.5-2.5 (3) MG/3ML nebulizer solution 3 mL  3 mL Inhalation Q6H PRN Lorretta Harp, MD       magnesium hydroxide (MILK OF MAGNESIA) suspension 30 mL  30 mL Oral Daily PRN Poggi, Excell Seltzer, MD       magnesium oxide (MAG-OX) tablet 400 mg  400 mg Oral Daily Lorretta Harp, MD   400 mg at 02/14/23 1018   methocarbamol (ROBAXIN) tablet 500 mg  500 mg Oral Q8H PRN Poggi, Excell Seltzer, MD  metoCLOPramide (REGLAN) tablet 5-10 mg  5-10 mg Oral Q8H PRN Poggi, Excell Seltzer, MD       Or   metoCLOPramide (REGLAN) injection 5-10 mg  5-10 mg Intravenous Q8H PRN Poggi, Excell Seltzer, MD       metoprolol tartrate (LOPRESSOR) tablet 25 mg  25 mg Oral BID Poggi, Excell Seltzer, MD   25 mg at 02/14/23 1018   morphine (PF) 2 MG/ML injection 1-2 mg  1-2 mg Intravenous Q4H PRN Poggi, Excell Seltzer, MD       multivitamin with minerals tablet 1 tablet  1 tablet Oral Daily Poggi, Excell Seltzer, MD   1 tablet at 02/14/23 1017   ondansetron (ZOFRAN) tablet 4 mg  4 mg Oral Q6H PRN Poggi, Excell Seltzer, MD       Or   ondansetron (ZOFRAN) injection 4 mg  4 mg Intravenous Q6H PRN Poggi, Excell Seltzer, MD       oxyCODONE-acetaminophen (PERCOCET/ROXICET) 5-325 MG per tablet 1 tablet  1 tablet Oral Q4H PRN Poggi, Excell Seltzer, MD   1 tablet at 02/14/23 1018   pantoprazole (PROTONIX) EC tablet 40 mg  40 mg Oral Daily Lorretta Harp, MD   40 mg at 02/14/23 1017   promethazine (PHENERGAN) 6.25 mg/NS 50 mL IVPB  6.25 mg Intravenous Q6H PRN Poggi, Excell Seltzer, MD       senna-docusate (Senokot-S) tablet 1 tablet  1 tablet Oral QHS PRN Poggi, Excell Seltzer, MD       sodium phosphate (FLEET) 7-19 GM/118ML enema 1 enema  1 enema Rectal Once PRN Poggi, Excell Seltzer, MD       traZODone (DESYREL) tablet 100 mg  100 mg Oral QHS Lorretta Harp, MD   100 mg at 02/13/23 2125     Discharge Medications: Please see discharge summary for a list of discharge medications.  Relevant Imaging Results:  Relevant Lab Results:   Additional Information SS# [id number]  Marlowe Sax,  RN

## 2023-02-14 NOTE — Evaluation (Signed)
Occupational Therapy Evaluation Patient Details Name: Leslie Parker MRN: [id number] DOB: Feb 06, 1933 Today's Date: 02/14/2023   History of Present Illness Leslie Parker is a 89 y.o. female with multiple medical problems including chronic atrial fibrillation for which she is on Eliquis, asthma/COPD, chronic renal insufficiency, diabetes, hypertension, and osteopenia who normally lives independently. Admitted following fall at home, x-rays confirmed a displaced intertrochanteric fracture of the right hip. S/p reduction and internal fixation of displaced intertrochanteric right hip fracture with nail.   Clinical Impression   Leslie Parker was seen for OT/PT co-evaluation this date. Prior to hospital admission, pt was MOD I for moblity using rollator or SPC as needed. Pt lives alone in sneior living apartments. Pt oriented x4 with minimal cueing for date; perseverative and tangential conversation. Limited by pain and nausea, RN notified and in at end of session. Pt currently requires MAX A exit bed and don B socks seated EOB. MAX A x2 + RW sit<>stand, poor tolerance, unable to weightbear through RLE. TOTAL A x2 sit>sup. Pt would benefit from skilled OT to address noted impairments and functional limitations (see below for any additional details). Upon hospital discharge, recommend OT follow up.   Recommendations for follow up therapy are one component of a multi-disciplinary discharge planning process, led by the attending physician.  Recommendations may be updated based on patient status, additional functional criteria and insurance authorization.   Assistance Recommended at Discharge Frequent or constant Supervision/Assistance  Patient can return home with the following Two people to help with walking and/or transfers;Two people to help with bathing/dressing/bathroom;Help with stairs or ramp for entrance    Functional Status Assessment  Patient has had a recent decline in their functional status and  demonstrates the ability to make significant improvements in function in a reasonable and predictable amount of time.  Equipment Recommendations  Other (comment) (defer to next venue)    Recommendations for Other Services       Precautions / Restrictions Precautions Precautions: Fall Restrictions Weight Bearing Restrictions: Yes RLE Weight Bearing: Weight bearing as tolerated      Mobility Bed Mobility Overal bed mobility: Needs Assistance Bed Mobility: Supine to Sit, Sit to Supine     Supine to sit: Max assist, HOB elevated Sit to supine: Total assist, +2 for physical assistance        Transfers Overall transfer level: Needs assistance Equipment used: Rolling walker (2 wheels) Transfers: Sit to/from Stand Sit to Stand: Max assist, +2 physical assistance                  Balance Overall balance assessment: Needs assistance Sitting-balance support: Bilateral upper extremity supported, Feet supported Sitting balance-[name]: Poor Sitting balance - Comments: L lateral lean r/t pain in R hip Postural control: Left lateral lean Standing balance support: Bilateral upper extremity supported Standing balance-[name]: Zero                             ADL either performed or assessed with clinical judgement   ADL Overall ADL's : Needs assistance/impaired                                       General ADL Comments: MAX A don B socks seated EOB. MOD A seated grooming tasks      Pertinent Vitals/Pain Pain Assessment Pain Assessment: Faces Faces Pain Scale:  Hurts whole lot Pain Location: R hip with ROM Pain Descriptors / Indicators: Discomfort, Grimacing, Operative site guarding Pain Intervention(s): Limited activity within patient's tolerance, Repositioned, Patient requesting pain meds-RN notified, RN gave pain meds during session     Hand Dominance     Extremity/Trunk Assessment Upper Extremity Assessment Upper Extremity  Assessment: Generalized weakness   Lower Extremity Assessment Lower Extremity Assessment: Generalized weakness       Communication Communication Communication: No difficulties (perseverative and tangential conversation)   Cognition Arousal/Alertness: Awake/alert Behavior During Therapy: WFL for tasks assessed/performed Overall Cognitive Status: Impaired/Different from baseline Area of Impairment: Safety/judgement, Problem solving, Memory                     Memory: Decreased recall of precautions, Decreased short-term memory   Safety/Judgement: Decreased awareness of safety, Decreased awareness of deficits   Problem Solving: Slow processing, Difficulty sequencing, Requires verbal cues        Home Living Family/patient expects to be discharged to:: Private residence Living Arrangements: Alone Available Help at Discharge: Available PRN/intermittently;Friend(s);Neighbor;Family Type of Home: Apartment Home Access: Level entry     Home Layout: One level     Bathroom Shower/Tub: Chief Strategy Officer: Handicapped height Bathroom Accessibility: Yes   Home Equipment: Rollator (4 wheels);Cane - single point;Grab bars - tub/shower;Grab bars - toilet   Additional Comments: Senior living apartment      Prior Functioning/Environment Prior Level of Function : Independent/Modified Independent               ADLs Comments: family drives her to MD appointments        OT Problem List: Decreased strength;Decreased range of motion;Decreased activity tolerance;Impaired balance (sitting and/or standing);Decreased safety awareness;Pain      OT Treatment/Interventions: Self-care/ADL training;Therapeutic exercise;Energy conservation;DME and/or AE instruction;Therapeutic activities;Patient/family education;Balance training    OT Goals(Current goals can be found in the care plan section) Acute Rehab OT Goals Patient Stated Goal: to improve pain OT Goal  Formulation: With patient Time For Goal Achievement: 02/28/23 Potential to Achieve Goals: Good ADL Goals Pt Will Perform Grooming: with modified independence;sitting Pt Will Perform Lower Body Dressing: with mod assist;sit to/from stand Pt Will Transfer to Toilet: with mod assist;stand pivot transfer;bedside commode  OT Frequency: Min 1X/week    Co-evaluation PT/OT/SLP Co-Evaluation/Treatment: Yes Reason for Co-Treatment: To address functional/ADL transfers;For patient/therapist safety PT goals addressed during session: Mobility/safety with mobility OT goals addressed during session: ADL's and self-care      AM-PAC OT "6 Clicks" Daily Activity     Outcome Measure Help from another person eating meals?: A Little Help from another person taking care of personal grooming?: A Little Help from another person toileting, which includes using toliet, bedpan, or urinal?: A Lot Help from another person bathing (including washing, rinsing, drying)?: A Lot Help from another person to put on and taking off regular upper body clothing?: A Lot Help from another person to put on and taking off regular lower body clothing?: A Lot 6 Click Score: 14   End of Session Equipment Utilized During Treatment: Rolling walker (2 wheels) Nurse Communication: Mobility status;Patient requests pain meds  Activity Tolerance: Patient tolerated treatment well;Patient limited by pain Patient left: in bed;with call bell/phone within reach;with bed alarm set;with nursing/sitter in room  OT Visit Diagnosis: Muscle weakness (generalized) (M62.81);Unsteadiness on feet (R26.81)                Time: 9562-1308 OT Time Calculation (min): 37 min Charges:  OT General Charges $OT Visit: 1 Visit OT Evaluation $OT Eval Moderate Complexity: 1 Mod OT Treatments $Self Care/Home Management : 8-22 mins  Kathie Dike, M.S. OTR/L  02/14/23, 10:56 AM  ascom [phone]

## 2023-02-14 NOTE — Progress Notes (Signed)
Subjective: 1 Day Post-Op Procedure(s) (LRB): INTRAMEDULLARY (IM) NAIL INTERTROCHANTERIC (Right) Patient reports pain as mild this morning but did report more pain when attempting to move the leg. Patient is well, and has had no acute complaints or problems PT and care management to assist with d/c planning, she will likely need SNF following discharge. Negative for chest pain and shortness of breath Fever: no Gastrointestinal:Negative for nausea and vomiting  Objective: Vital signs in last 24 hours: Temp:  [97.5 F (36.4 C)-99 F (37.2 C)] 99 F (37.2 C) (07/25 0750) Pulse Rate:  [60-67] 60 (07/25 0750) Resp:  [11-20] 16 (07/25 0750) BP: (100-178)/(52-71) 117/66 (07/25 0750) SpO2:  [95 %-100 %] 96 % (07/25 0750) Weight:  [81.2 kg] 81.2 kg (07/25 1018)  Intake/Output from previous day:  Intake/Output Summary (Last 24 hours) at 02/14/2023 1342 Last data filed at 02/14/2023 1021 Gross per 24 hour  Intake 1800 ml  Output 600 ml  Net 1200 ml    Intake/Output this shift: Total I/O In: 360 [P.O.:360] Out: -   Labs: Recent Labs    02/13/23 0737 02/14/23 0539  HGB 13.0 8.5*   Recent Labs    02/13/23 0737 02/14/23 0539  WBC 12.9* 12.2*  RBC 4.11 2.78*  HCT 36.5 25.0*  PLT 202 162   Recent Labs    02/13/23 0737 02/13/23 1238 02/14/23 0539  NA 135  --  132*  K 4.1 4.1 4.6  CL 107  --  104  CO2 21*  --  22  BUN 43*  --  52*  CREATININE 1.74*  --  2.19*  GLUCOSE 133*  --  233*  CALCIUM 8.6*  --  7.7*   Recent Labs    02/13/23 0953  INR 1.1     EXAM General - Patient is Alert, Appropriate, and Oriented Extremity - ABD soft Neurovascular intact Dorsiflexion/Plantar flexion intact Incision: dressing C/D/I No cellulitis present Dressing/Incision - clean, dry, no drainage to right leg honeycomb dressings. Motor Function - intact, moving foot and toes well on exam.  Abdomen soft with intact bowel sounds.  Past Medical History:  Diagnosis Date    Asthma    Atrial fibrillation with RVR (HCC)    Cancer (HCC)    Basal Cell   Diabetes mellitus without complication (HCC)    Heart murmur    Hypertension    Impetigo    Osteoarthritis    Osteopenia    Vomiting    can not due to surgery   Wears dentures    full upper and lower    Assessment/Plan: 1 Day Post-Op Procedure(s) (LRB): INTRAMEDULLARY (IM) NAIL INTERTROCHANTERIC (Right) Principal Problem:   Closed right hip fracture (HCC) Active Problems:   Stage 3b chronic kidney disease (HCC)   Chronic a-fib (HCC)   Essential hypertension   Fall at home, initial encounter   Type II diabetes mellitus with renal manifestations (HCC)   Chronic diastolic CHF (congestive heart failure) (HCC)   COPD (chronic obstructive pulmonary disease) (HCC)   Leukocytosis  Estimated body mass index is 26.43 kg/m as calculated from the following:   Height as of this encounter: 5\' 9"  (1.753 m).   Weight as of this encounter: 81.2 kg. Advance diet Up with therapy D/C IV fluids when tolerating po intake.  Labs reviewed this AM.  WBC 12.2.  Hg 8.5 this morning. Up with therapy today.  Current plan is for d/c to SNF. She is passing gas this morning, continue to work on BM.  Following discharge  continue Eliquis for DVT prophylaxis. Follow-up with [facility] in 10-14 days for staple removal.  DVT Prophylaxis - TED hose and Eliquis Weight-Bearing as tolerated to right leg  J. Horris Latino, PA-C [facility] 02/14/2023, 1:42 PM

## 2023-02-14 NOTE — Plan of Care (Signed)
  Problem: Education: Goal: Ability to describe self-care measures that may prevent or decrease complications (Diabetes Survival Skills Education) will improve Outcome: Progressing   Problem: Skin Integrity: Goal: Risk for impaired skin integrity will decrease Outcome: Progressing   Problem: Pain Managment: Goal: General experience of comfort will improve Outcome: Progressing   Problem: Safety: Goal: Ability to remain free from injury will improve Outcome: Progressing   

## 2023-02-14 NOTE — Progress Notes (Addendum)
PROGRESS NOTE    [name]   XBJ:[id number] DOB: July 04, 1933  DOA: 02/13/2023 Date of Service: 02/14/23 PCP: [name], MD     Brief Narrative / Hospital Course:  Leslie Parker is a 89 y.o. female with medical history significant of dCHF, SSS (s/p of PPM), A fib on Eliquis, HTN, HJLD, DM, COPD, CKD-3b, osteopenia, left hip fracture, who presents with fall and right hip pain after went to turn and lost her balance fell to the right side and struck her right hip on the edge of the bathtub. No LOC.  07/24: Admitted to hospitalist service w/ acute intertrochanteric right femoral neck fracture, and prior left hip total arthroplasty. 07/25: SNF placement pending   Consultants:  Orthopedics Cardiology for surgical clearance   Procedures: Reduction and internal fixation of displaced intertrochanteric right hip fracture 02/13/23 w/ Dr Joice Lofts      ASSESSMENT & PLAN:   Principal Problem:   Closed right hip fracture [facility]) Active Problems:   Fall at home, initial encounter   Leukocytosis   Chronic a-fib (HCC)   Chronic diastolic CHF (congestive heart failure) (HCC)   Essential hypertension   Type II diabetes mellitus with renal manifestations (HCC)   COPD (chronic obstructive pulmonary disease) (HCC)   Stage 3b chronic kidney disease (HCC)   Closed right hip fracture [facility]):  Acute intertrochanteric right femoral neck fracture No neurovascular compromise.  S/p Reduction and internal fixation of displaced intertrochanteric right hip fracture 02/13/23 pain control: prn morphine, percocet and tyleno Prn Zofran for nausea Robaxin for muscle spasm Lidoderm patch for pain PT/OT    Abnormal EKG w/ T-wave inversions Echocardiogram --> without concerning abnormalities  Troponin -->   Postoperative anemia: Hgb 13.0 --> 8.5 May be hemodilutional component Continue hold eliquis [facility]  Leukocytosis:  WBC 12.9, Likely due to stress-induced demargination.  Patient does  not have signs of infection. monitor CBC   Fall at home, initial encounter PT/OT    Chronic a-fib (HCC): HR 67 Held Eliquis pending surgical intervention, resume per orthopedics when able  Continue Amiodarone, Cardizem, metoprolol   Chronic diastolic CHF (congestive heart failure) (HCC):  2D echo 06/06/2022 showed EF 60 to 65%.  Patient has trace leg edema, no JVD, BNP 350, does not seem to have CHF exacerbation. Continue home Lasix   Essential hypertension IV hydralazine as needed Lasix, Cardizem, metoprolol,   Type II diabetes mellitus with renal manifestations (HCC):  A1c 6.5 recently.  Well-controlled.  Patient taking glipizide SSI   COPD (chronic obstructive pulmonary disease) (HCC):  Stable As needed bronchodilators   Stage 3b chronic kidney disease (HCC):  Stable.  Recent baseline creatinine 1.6-7.9.  Her creatinine is 7.74, BUN 43, GFR 28 Follow BMP   DVT prophylaxis: SCD Pertinent IV fluids/nutrition: d/c IV fluids. Cardiac/carb diet  Central lines / invasive devices: none  Code Status: FULL CODE ACP documentation reviewed: 02/14/23, advanced directive on file naming HCPOA   Current Admission Status: inpatient  TOC needs / Dispo plan: SNF rehab Barriers to discharge / significant pending items: awaiting placement              Subjective / Brief ROS:  Patient reports doing well post-op Denies CP/SOB.  Pain controlled.  Denies new weakness.  Tolerating diet.  Reports no concerns w/ urination/defecation.   Family Communication: none at this time     Objective Findings:  Vitals:   02/14/23 0430 02/14/23 0729 02/14/23 0750 02/14/23 1018  BP: (!) 100/52  117/66  Pulse: 61  60   Resp: 20  16   Temp: 98.7 F (37.1 C)  99 F (37.2 C)   TempSrc:      SpO2: 98% 97% 96%   Weight:    81.2 kg  Height:    5\' 9"  (1.753 m)    Intake/Output Summary (Last 24 hours) at 02/14/2023 1401 Last data filed at 02/14/2023 1021 Gross per 24 hour  Intake  1700 ml  Output 600 ml  Net 1100 ml   Filed Weights   02/14/23 1018  Weight: 81.2 kg    Examination:  Physical Exam Constitutional:      General: She is not in acute distress.    Appearance: Normal appearance.  Cardiovascular:     Rate and Rhythm: Normal rate and regular rhythm.     Heart sounds: Normal heart sounds.  Pulmonary:     Effort: Pulmonary effort is normal.     Breath sounds: Normal breath sounds.  Abdominal:     General: Bowel sounds are normal. There is no distension.     Palpations: Abdomen is soft.  Skin:    General: Skin is warm and dry.  Neurological:     General: No focal deficit present.     Mental Status: She is alert and oriented to person, place, and time.  Psychiatric:        Mood and Affect: Mood normal.        Behavior: Behavior normal.          Scheduled Medications:   acetaminophen  1,000 mg Oral Q6H   amiodarone  200 mg Oral Daily   apixaban  2.5 mg Oral BID   ascorbic acid  250 mg Oral Daily   cholecalciferol  1,000 Units Oral Daily   colestipol  1 g Oral BID   diltiazem  360 mg Oral Daily   docusate sodium  100 mg Oral BID   feeding supplement  237 mL Oral BID BM   fluticasone furoate-vilanterol  1 puff Inhalation Daily   And   umeclidinium bromide  1 puff Inhalation Daily   furosemide  20 mg Oral Daily   insulin aspart  0-5 Units Subcutaneous QHS   insulin aspart  0-9 Units Subcutaneous TID WC   magnesium oxide  400 mg Oral Daily   metoprolol tartrate  25 mg Oral BID   multivitamin with minerals  1 tablet Oral Daily   pantoprazole  40 mg Oral Daily   traZODone  100 mg Oral QHS    Continuous Infusions:  promethazine (PHENERGAN) injection (IM or IVPB)      PRN Medications:  acetaminophen, albuterol, bisacodyl, dextromethorphan-guaiFENesin, diphenhydrAMINE, hydrALAZINE, ipratropium-albuterol, magnesium hydroxide, methocarbamol, metoCLOPramide **OR** metoCLOPramide (REGLAN) injection, morphine injection, ondansetron **OR**  ondansetron (ZOFRAN) IV, oxyCODONE-acetaminophen, promethazine (PHENERGAN) injection (IM or IVPB), senna-docusate, sodium phosphate  Antimicrobials from admission:  Anti-infectives (From admission, onward)    Start     Dose/Rate Route Frequency Ordered Stop   02/13/23 2200  ceFAZolin (ANCEF) IVPB 2g/100 mL premix        2 g 200 mL/hr over 30 Minutes Intravenous Every 8 hours 02/13/23 1649 02/14/23 0622   02/13/23 0749  ceFAZolin (ANCEF) IVPB 2g/100 mL premix        2 g 200 mL/hr over 30 Minutes Intravenous 30 min pre-op 02/13/23 0751 02/13/23 1420           Data Reviewed:  I have personally reviewed the following...  CBC: Recent Labs  Lab 02/13/23 0737 02/14/23 0539  WBC 12.9* 12.2*  NEUTROABS 10.2*  --   HGB 13.0 8.5*  HCT 36.5 25.0*  MCV 88.8 89.9  PLT 202 162   Basic Metabolic Panel: Recent Labs  Lab 02/13/23 0737 02/13/23 1238 02/14/23 0539  NA 135  --  132*  K 4.1 4.1 4.6  CL 107  --  104  CO2 21*  --  22  GLUCOSE 133*  --  233*  BUN 43*  --  52*  CREATININE 1.74*  --  2.19*  CALCIUM 8.6*  --  7.7*   GFR: Estimated Creatinine Clearance: 20.2 mL/min (A) (by C-G formula based on SCr of 2.19 mg/dL (H)). Liver Function Tests: No results for input(s): "AST", "ALT", "ALKPHOS", "BILITOT", "PROT", "ALBUMIN" in the last 168 hours. No results for input(s): "LIPASE", "AMYLASE" in the last 168 hours. No results for input(s): "AMMONIA" in the last 168 hours. Coagulation Profile: Recent Labs  Lab 02/13/23 0953  INR 1.1   Cardiac Enzymes: Recent Labs  Lab 02/13/23 0953  CKTOTAL 93   BNP (last 3 results) No results for input(s): "PROBNP" in the last 8760 hours. HbA1C: No results for input(s): "HGBA1C" in the last 72 hours. CBG: Recent Labs  Lab 02/13/23 1518 02/13/23 1646 02/13/23 2156 02/14/23 0753 02/14/23 1114  GLUCAP 155* 193* 270* 210* 240*   Lipid Profile: No results for input(s): "CHOL", "HDL", "LDLCALC", "TRIG", "CHOLHDL", "LDLDIRECT" in  the last 72 hours. Thyroid Function Tests: No results for input(s): "TSH", "T4TOTAL", "FREET4", "T3FREE", "THYROIDAB" in the last 72 hours. Anemia Panel: No results for input(s): "VITAMINB12", "FOLATE", "FERRITIN", "TIBC", "IRON", "RETICCTPCT" in the last 72 hours. Most Recent Urinalysis On File:     Component Value Date/Time   COLORURINE AMBER (A) 11/24/2022 2239   APPEARANCEUR CLOUDY (A) 11/24/2022 2239   APPEARANCEUR Clear 02/19/2013 0254   LABSPEC 1.016 11/24/2022 2239   LABSPEC 1.006 02/19/2013 0254   PHURINE 5.0 11/24/2022 2239   GLUCOSEU NEGATIVE 11/24/2022 2239   GLUCOSEU Negative 02/19/2013 0254   HGBUR NEGATIVE 11/24/2022 2239   BILIRUBINUR NEGATIVE 11/24/2022 2239   BILIRUBINUR Negative 02/19/2013 0254   KETONESUR 5 (A) 11/24/2022 2239   PROTEINUR 100 (A) 11/24/2022 2239   NITRITE NEGATIVE 11/24/2022 2239   LEUKOCYTESUR MODERATE (A) 11/24/2022 2239   LEUKOCYTESUR 1+ 02/19/2013 0254   Sepsis Labs: @LABRCNTIP (procalcitonin:4,lacticidven:4) Microbiology: No results found for this or any previous visit (from the past 240 hour(s)).    Radiology Studies last 3 days: DG FEMUR, MIN 2 VIEWS RIGHT  Result Date: 02/13/2023 CLINICAL DATA:  Right intratrochanteric fracture EXAM: RIGHT FEMUR 2 VIEWS COMPARISON:  02/13/2023 FLUOROSCOPY TIME:  Radiation Exposure Index (as provided by the fluoroscopic device): 10.53 mGy If the device does not provide the exposure index: Fluoroscopy Time:  1 minute 4 seconds Number of Acquired Images:  4 FINDINGS: Proximal medullary rod is noted with fixation screw traversing the right femoral neck. Fracture fragments are in near anatomic alignment. The distal aspect the medullary rod is visualized with single fixation screw. IMPRESSION: ORIF of right intratrochanteric fracture. Electronically Signed   By: Alcide Clever M.D.   On: 02/13/2023 18:14   ECHOCARDIOGRAM COMPLETE  Result Date: 02/13/2023    ECHOCARDIOGRAM REPORT   Patient Name:   MARRIANA HIBBERD Cotten  Date of Exam: 02/13/2023 Medical Rec #:  [id number]     Height:       69.0 in Accession #:    [id number]    Weight:       196.2 lb Date of Birth:  21-Jun-1933      BSA:          2.049 m Patient Age:    89 years      BP:           163/74 mmHg Patient Gender: F             HR:           74 bpm. Exam Location:  [facility] Procedure: 2D Echo, Cardiac Doppler and Color Doppler STAT ECHO Indications:     Abnormal ECG R94.31  History:         Patient has prior history of Echocardiogram examinations, most                  recent 06/06/2022. Arrythmias:Atrial Fibrillation; Risk                  Factors:Diabetes and Hypertension.  Sonographer:     Cristela Blue Referring Phys:  [id number] LILY MICHELLE TANG Diagnosing Phys: Alwyn Pea MD IMPRESSIONS  1. Left ventricular ejection fraction, by estimation, is 60 to 65%. The left ventricle has normal function. The left ventricle has no regional wall motion abnormalities. There is moderate concentric left ventricular hypertrophy. Left ventricular diastolic parameters are consistent with Grade I diastolic dysfunction (impaired relaxation).  2. Right ventricular systolic function is normal. The right ventricular size is normal.  3. The mitral valve is grossly normal. Mild mitral valve regurgitation.  4. The aortic valve is calcified. Aortic valve regurgitation is trivial. Aortic valve sclerosis/calcification is present, without any evidence of aortic stenosis. FINDINGS  Left Ventricle: Left ventricular ejection fraction, by estimation, is 60 to 65%. The left ventricle has normal function. The left ventricle has no regional wall motion abnormalities. The left ventricular internal cavity size was normal in size. There is  moderate concentric left ventricular hypertrophy. Left ventricular diastolic parameters are consistent with Grade I diastolic dysfunction (impaired relaxation). Right Ventricle: The right ventricular size is normal. No increase in right ventricular wall thickness. Right  ventricular systolic function is normal. Left Atrium: Left atrial size was normal in size. Right Atrium: Right atrial size was normal in size. Pericardium: There is no evidence of pericardial effusion. Mitral Valve: The mitral valve is grossly normal. There is mild thickening of the mitral valve leaflet(s). There is mild calcification of the mitral valve leaflet(s). Mild to moderate mitral annular calcification. Mild mitral valve regurgitation. MV peak  gradient, 6.2 mmHg. The mean mitral valve gradient is 2.0 mmHg. Tricuspid Valve: The tricuspid valve is normal in structure. Tricuspid valve regurgitation is trivial. Aortic Valve: The aortic valve is calcified. Aortic valve regurgitation is trivial. Aortic valve sclerosis/calcification is present, without any evidence of aortic stenosis. Aortic valve mean gradient measures 11.7 mmHg. Aortic valve peak gradient measures 19.5 mmHg. Aortic valve area, by VTI measures 1.80 cm. Pulmonic Valve: The pulmonic valve was normal in structure. Pulmonic valve regurgitation is not visualized. Aorta: The ascending aorta was not well visualized. IAS/Shunts: No atrial level shunt detected by color flow Doppler.  LEFT VENTRICLE PLAX 2D LVIDd:         4.50 cm   Diastology LVIDs:         2.60 cm   LV e' medial:    3.70 cm/s LV PW:         1.70 cm   LV E/e' medial:  23.5 LV IVS:        1.30 cm   LV e' lateral:   3.70 cm/s  LVOT diam:     2.00 cm   LV E/e' lateral: 23.5 LV SV:         81 LV SV Index:   40 LVOT Area:     3.14 cm  RIGHT VENTRICLE RV Basal diam:  2.90 cm RV Mid diam:    2.30 cm RV S prime:     14.40 cm/s TAPSE (M-mode): 2.1 cm LEFT ATRIUM           Index        RIGHT ATRIUM           Index LA diam:      3.80 cm 1.85 cm/m   RA Area:     11.70 cm LA Vol (A2C): 30.0 ml 14.64 ml/m  RA Volume:   23.00 ml  11.22 ml/m LA Vol (A4C): 31.4 ml 15.32 ml/m  AORTIC VALVE AV Area (Vmax):    1.52 cm AV Area (Vmean):   1.64 cm AV Area (VTI):     1.80 cm AV Vmax:           221.00  cm/s AV Vmean:          157.667 cm/s AV VTI:            0.451 m AV Peak Grad:      19.5 mmHg AV Mean Grad:      11.7 mmHg LVOT Vmax:         107.00 cm/s LVOT Vmean:        82.200 cm/s LVOT VTI:          0.258 m LVOT/AV VTI ratio: 0.57  AORTA Ao Root diam: 3.20 cm MITRAL VALVE                TRICUSPID VALVE MV Area (PHT): 2.02 cm     TR Peak grad:   16.3 mmHg MV Area VTI:   2.06 cm     TR Vmax:        202.00 cm/s MV Peak grad:  6.2 mmHg MV Mean grad:  2.0 mmHg     SHUNTS MV Vmax:       1.25 m/s     Systemic VTI:  0.26 m MV Vmean:      72.5 cm/s    Systemic Diam: 2.00 cm MV Decel Time: 375 msec MV E velocity: 87.00 cm/s MV A velocity: 127.00 cm/s MV E/A ratio:  0.69 Dwayne D Callwood MD Electronically signed by Alwyn Pea MD Signature Date/Time: 02/13/2023/4:50:20 PM    Final    DG C-Arm 1-60 Min-No Report  Result Date: 02/13/2023 Fluoroscopy was utilized by the requesting physician.  No radiographic interpretation.   DG C-Arm 1-60 Min-No Report  Result Date: 02/13/2023 Fluoroscopy was utilized by the requesting physician.  No radiographic interpretation.   DG Hip Unilat W or Wo Pelvis 2-3 Views Right  Result Date: 02/13/2023 CLINICAL DATA:  Right hip pain EXAM: DG HIP (WITH OR WITHOUT PELVIS) 3V RIGHT COMPARISON:  None Available. FINDINGS: Acute intertrochanteric femoral neck fracture extending through the lesser trochanter. The femoral head remains located within the acetabulum. Surgical changes of prior left hip arthroplasty are evident. Atherosclerotic vascular calcifications visualized in the bilateral superficial femoral arteries. The bony pelvis appears intact. Mineralization is low suggesting underlying osteoporosis. Unremarkable bowel gas pattern. IMPRESSION: 1. Positive for acute intertrochanteric right femoral neck fracture. 2. Surgical changes of prior left hip total arthroplasty. 3. Peripheral arterial disease. Electronically Signed   By: Malachy Moan M.D.   On:  02/13/2023 07:29    DG Chest 1 View  Result Date: 02/13/2023 CLINICAL DATA:  [id number].  Fall injury, initial encounter. EXAM: CHEST  1 VIEW COMPARISON:  Portable chest 11/28/2022 FINDINGS: Left chest tube pacing system and wire insertions are unchanged. Mild cardiomegaly. No evidence of CHF. The aorta is tortuous and calcified with stable mediastinum. The lungs are mildly emphysematous but clear.  The sulci are sharp. There are no new osseous findings with osteopenia and again seen reverse right shoulder arthroplasty, mild thoracic dextroscoliosis and degenerative changes. IMPRESSION: 1. No evidence of acute chest disease.  Stable COPD chest. 2. Aortic atherosclerosis and uncoiling. Electronically Signed   By: Almira Bar M.D.   On: 02/13/2023 07:28             LOS: 1 day      Sunnie Nielsen, DO [facility] 02/14/2023, 2:01 PM    Dictation software may have been used to generate the above note. Typos may occur and escape review in typed/dictated notes. Please contact Dr Lyn Hollingshead directly for clarity if needed.  Staff may message me via secure chat in Epic  but this may not receive an immediate response,  please page me for urgent matters!  If 7PM-7AM, please contact night coverage [email or web address]

## 2023-02-14 NOTE — Anesthesia Postprocedure Evaluation (Signed)
Anesthesia Post Note  Patient: Leslie Parker  Procedure(s) Performed: INTRAMEDULLARY (IM) NAIL INTERTROCHANTERIC (Right: Hip)  Patient location during evaluation: PACU Anesthesia Type: General Level of consciousness: awake and alert Pain management: pain level controlled Vital Signs Assessment: post-procedure vital signs reviewed and stable Respiratory status: spontaneous breathing, nonlabored ventilation, respiratory function stable and patient connected to nasal cannula oxygen Cardiovascular status: blood pressure returned to baseline and stable Postop Assessment: no apparent nausea or vomiting Anesthetic complications: no   There were no known notable events for this encounter.   Last Vitals:  Vitals:   02/14/23 0011 02/14/23 0430  BP: 128/60 (!) 100/52  Pulse: 62 61  Resp: 20 20  Temp: 36.7 C 37.1 C  SpO2: 99% 98%    Last Pain:  Vitals:   02/14/23 0551  TempSrc:   PainSc: 6                  Cleda Mccreedy Yashika Mask

## 2023-02-14 NOTE — Hospital Course (Addendum)
Leslie Parker is a 89 y.o. female with medical history significant of dCHF, SSS (s/p of PPM), A fib on Eliquis, HTN, HJLD, DM, COPD, CKD-3b, osteopenia, left hip fracture, who presents with fall and right hip pain after went to turn and lost her balance fell to the right side and struck her right hip on the edge of the bathtub. No LOC.  07/24: Admitted to hospitalist service w/ acute intertrochanteric right femoral neck fracture, and prior left hip total arthroplasty. 07/25: SNF placement pending   Consultants:  Orthopedics Cardiology for surgical clearance   Procedures: Reduction and internal fixation of displaced intertrochanteric right hip fracture 02/13/23 w/ Dr Joice Lofts      ASSESSMENT & PLAN:   Principal Problem:   Closed right hip fracture [facility]) Active Problems:   Fall at home, initial encounter   Leukocytosis   Chronic a-fib (HCC)   Chronic diastolic CHF (congestive heart failure) (HCC)   Essential hypertension   Type II diabetes mellitus with renal manifestations (HCC)   COPD (chronic obstructive pulmonary disease) (HCC)   Stage 3b chronic kidney disease (HCC)   Closed right hip fracture [facility]):  Acute intertrochanteric right femoral neck fracture No neurovascular compromise.  S/p Reduction and internal fixation of displaced intertrochanteric right hip fracture 02/13/23 pain control: prn morphine, percocet and tyleno Prn Zofran for nausea Robaxin for muscle spasm Lidoderm patch for pain PT/OT    Abnormal EKG w/ T-wave inversions Echocardiogram --> without concerning abnormalities  Troponin -->   Postoperative anemia: Hgb 13.0 --> 8.5 May be hemodilutional component Continue hold eliquis Trend HH  Leukocytosis:  WBC 12.9, Likely due to stress-induced demargination.  Patient does not have signs of infection. monitor CBC   Fall at home, initial encounter PT/OT    Chronic a-fib (HCC): HR 67 Held Eliquis pending surgical intervention, resume per orthopedics  when able  Continue Amiodarone, Cardizem, metoprolol   Chronic diastolic CHF (congestive heart failure) (HCC):  2D echo 06/06/2022 showed EF 60 to 65%.  Patient has trace leg edema, no JVD, BNP 350, does not seem to have CHF exacerbation. Continue home Lasix   Essential hypertension IV hydralazine as needed Lasix, Cardizem, metoprolol,   Type II diabetes mellitus with renal manifestations (HCC):  A1c 6.5 recently.  Well-controlled.  Patient taking glipizide SSI   COPD (chronic obstructive pulmonary disease) (HCC):  Stable As needed bronchodilators   Stage 3b chronic kidney disease (HCC):  Stable.  Recent baseline creatinine 1.6-7.9.  Her creatinine is 7.74, BUN 43, GFR 28 Follow BMP   DVT prophylaxis: SCD Pertinent IV fluids/nutrition: d/c IV fluids. Cardiac/carb diet  Central lines / invasive devices: none  Code Status: FULL CODE ACP documentation reviewed: 02/14/23, advanced directive on file naming HCPOA   Current Admission Status: inpatient  TOC needs / Dispo plan: SNF rehab Barriers to discharge / significant pending items: awaiting placement

## 2023-02-14 NOTE — TOC Progression Note (Signed)
Transition of Care [facility]) - Progression Note    Patient Details  Name: Leslie Parker MRN: [id number] Date of Birth: 12-04-1932  Transition of Care [facility]) CM/SW Contact  Marlowe Sax, RN Phone Number: 02/14/2023, 2:52 PM  Clinical Narrative:     Ins is pending to go to [facility] ref number [id number]  Expected Discharge Plan: Skilled Nursing Facility Barriers to Discharge: Insurance Authorization  Expected Discharge Plan and Services   Discharge Planning Services: CM Consult   Living arrangements for the past 2 months: Single Family Home                                       Social Determinants of Health (SDOH) Interventions SDOH Screenings   Food Insecurity: No Food Insecurity (02/13/2023)  Housing: Low Risk  (02/13/2023)  Transportation Needs: No Transportation Needs (02/13/2023)  Utilities: Not At Risk (02/13/2023)  Tobacco Use: Low Risk  (02/13/2023)    Readmission Risk Interventions    08/27/2022   12:05 PM  Readmission Risk Prevention Plan  Transportation Screening Complete  PCP or Specialist Appt within 3-5 Days Complete  HRI or Home Care Consult Complete  Medication Review (RN Care Manager) Complete

## 2023-02-14 NOTE — Plan of Care (Signed)
   Problem: Coping: Goal: Ability to adjust to condition or change in health will improve Outcome: Progressing   Problem: Nutritional: Goal: Maintenance of adequate nutrition will improve Outcome: Progressing

## 2023-02-14 NOTE — Progress Notes (Signed)
1039 D/c tele per Dr Lyn Hollingshead

## 2023-02-15 DIAGNOSIS — J9811 Atelectasis: Secondary | ICD-10-CM | POA: Diagnosis not present

## 2023-02-15 DIAGNOSIS — I10 Essential (primary) hypertension: Secondary | ICD-10-CM | POA: Diagnosis not present

## 2023-02-15 DIAGNOSIS — Z7401 Bed confinement status: Secondary | ICD-10-CM | POA: Diagnosis not present

## 2023-02-15 DIAGNOSIS — J449 Chronic obstructive pulmonary disease, unspecified: Secondary | ICD-10-CM | POA: Diagnosis not present

## 2023-02-15 DIAGNOSIS — S72001A Fracture of unspecified part of neck of right femur, initial encounter for closed fracture: Secondary | ICD-10-CM | POA: Diagnosis not present

## 2023-02-15 DIAGNOSIS — R7981 Abnormal blood-gas level: Secondary | ICD-10-CM | POA: Diagnosis not present

## 2023-02-15 DIAGNOSIS — I5032 Chronic diastolic (congestive) heart failure: Secondary | ICD-10-CM | POA: Diagnosis not present

## 2023-02-15 DIAGNOSIS — D649 Anemia, unspecified: Secondary | ICD-10-CM | POA: Diagnosis not present

## 2023-02-15 DIAGNOSIS — Z950 Presence of cardiac pacemaker: Secondary | ICD-10-CM | POA: Diagnosis not present

## 2023-02-15 DIAGNOSIS — S72141D Displaced intertrochanteric fracture of right femur, subsequent encounter for closed fracture with routine healing: Secondary | ICD-10-CM | POA: Diagnosis not present

## 2023-02-15 DIAGNOSIS — L03211 Cellulitis of face: Secondary | ICD-10-CM | POA: Diagnosis not present

## 2023-02-15 DIAGNOSIS — S72001D Fracture of unspecified part of neck of right femur, subsequent encounter for closed fracture with routine healing: Secondary | ICD-10-CM | POA: Diagnosis not present

## 2023-02-15 DIAGNOSIS — N1832 Chronic kidney disease, stage 3b: Secondary | ICD-10-CM | POA: Diagnosis not present

## 2023-02-15 DIAGNOSIS — Z79899 Other long term (current) drug therapy: Secondary | ICD-10-CM | POA: Diagnosis not present

## 2023-02-15 DIAGNOSIS — L89616 Pressure-induced deep tissue damage of right heel: Secondary | ICD-10-CM | POA: Diagnosis not present

## 2023-02-15 DIAGNOSIS — R600 Localized edema: Secondary | ICD-10-CM | POA: Diagnosis not present

## 2023-02-15 DIAGNOSIS — E43 Unspecified severe protein-calorie malnutrition: Secondary | ICD-10-CM | POA: Diagnosis not present

## 2023-02-15 DIAGNOSIS — R195 Other fecal abnormalities: Secondary | ICD-10-CM | POA: Diagnosis not present

## 2023-02-15 DIAGNOSIS — R110 Nausea: Secondary | ICD-10-CM | POA: Diagnosis not present

## 2023-02-15 DIAGNOSIS — R945 Abnormal results of liver function studies: Secondary | ICD-10-CM | POA: Diagnosis not present

## 2023-02-15 DIAGNOSIS — R001 Bradycardia, unspecified: Secondary | ICD-10-CM | POA: Diagnosis not present

## 2023-02-15 DIAGNOSIS — L89611 Pressure ulcer of right heel, stage 1: Secondary | ICD-10-CM | POA: Diagnosis not present

## 2023-02-15 DIAGNOSIS — L89612 Pressure ulcer of right heel, stage 2: Secondary | ICD-10-CM | POA: Diagnosis not present

## 2023-02-15 DIAGNOSIS — I509 Heart failure, unspecified: Secondary | ICD-10-CM | POA: Diagnosis not present

## 2023-02-15 DIAGNOSIS — E119 Type 2 diabetes mellitus without complications: Secondary | ICD-10-CM | POA: Diagnosis not present

## 2023-02-15 LAB — GLUCOSE, CAPILLARY: Glucose-Capillary: 187 mg/dL — ABNORMAL HIGH (ref 70–99)

## 2023-02-15 MED ORDER — OXYCODONE-ACETAMINOPHEN 5-325 MG PO TABS: 1.0000 | ORAL_TABLET | ORAL | 0 refills | Status: DC | PRN

## 2023-02-15 MED ORDER — DOCUSATE SODIUM 100 MG PO CAPS: 100.0000 mg | ORAL_CAPSULE | Freq: Two times a day (BID) | ORAL | Status: DC

## 2023-02-15 MED ORDER — METOPROLOL TARTRATE 25 MG PO TABS: 25.0000 mg | ORAL_TABLET | Freq: Two times a day (BID) | ORAL | Status: DC

## 2023-02-15 MED ORDER — BISACODYL 10 MG RE SUPP: 10.0000 mg | Freq: Every day | RECTAL | Status: DC | PRN

## 2023-02-15 MED ORDER — ENSURE ENLIVE PO LIQD: 237.0000 mL | Freq: Two times a day (BID) | ORAL | Status: DC

## 2023-02-15 MED ORDER — METHOCARBAMOL 500 MG PO TABS: 500.0000 mg | ORAL_TABLET | Freq: Three times a day (TID) | ORAL | Status: DC | PRN

## 2023-02-15 NOTE — TOC Progression Note (Signed)
Transition of Care [facility]) - Progression Note    Patient Details  Name: RODINA SANMARTIN MRN: [id number] Date of Birth: 1933-05-26  Transition of Care [facility]) CM/SW Contact  Marlowe Sax, RN Phone Number: 02/15/2023, 10:26 AM  Clinical Narrative:    Patient to go to [facility] room 804 today EMS to transport Called [name] the POA and let her know about the DC and room number, called EMS to transport She is number 2 on list   Expected Discharge Plan: Skilled Nursing Facility Barriers to Discharge: Insurance Authorization  Expected Discharge Plan and Services   Discharge Planning Services: CM Consult   Living arrangements for the past 2 months: Single Family Home Expected Discharge Date: 02/15/23                                     Social Determinants of Health (SDOH) Interventions SDOH Screenings   Food Insecurity: No Food Insecurity (02/13/2023)  Housing: Low Risk  (02/13/2023)  Transportation Needs: No Transportation Needs (02/13/2023)  Utilities: Not At Risk (02/13/2023)  Tobacco Use: Low Risk  (02/13/2023)    Readmission Risk Interventions    08/27/2022   12:05 PM  Readmission Risk Prevention Plan  Transportation Screening Complete  PCP or Specialist Appt within 3-5 Days Complete  HRI or Home Care Consult Complete  Medication Review (RN Care Manager) Complete

## 2023-02-15 NOTE — Discharge Summary (Signed)
Physician Discharge Summary   Patient: Leslie Parker MRN: [id number]  DOB: 01-23-1933   Admit:     Date of Admission: 02/13/2023 Admitted from: home   Discharge: Date of discharge: 02/15/23 Disposition: Skilled nursing facility Condition at discharge: good  CODE STATUS: FULL CODE     Discharge Physician: Sunnie Nielsen, DO [facility]     PCP: [name], MD  Recommendations for Outpatient Follow-up:  Follow up with PCP [name], MD in 2-4 weeks Continue Eliquis + TED hose for DVT prophylaxis. Repeat CBC in 2-3 days, sooner if concerns for bleeding or low blood pressure Follow-up with [facility] in 10-14 days for staple removal. Weight-Bearing as tolerated to right leg    Discharge Instructions     Call MD for:  redness, tenderness, or signs of infection (pain, swelling, redness, odor or green/yellow discharge around incision site)   Complete by: As directed    Diet Carb Modified   Complete by: As directed    Increase activity slowly   Complete by: As directed          Discharge Diagnoses: Principal Problem:   Closed right hip fracture [facility]) Active Problems:   Fall at home, initial encounter   Leukocytosis   Chronic a-fib (HCC)   Chronic diastolic CHF (congestive heart failure) (HCC)   Essential hypertension   Type II diabetes mellitus with renal manifestations (HCC)   COPD (chronic obstructive pulmonary disease) (HCC)   Stage 3b chronic kidney disease [facility])       Hospital Course: [name] is a 89 y.o. female with medical history significant of dCHF, SSS (s/p of PPM), A fib on Eliquis, HTN, HJLD, DM, COPD, CKD-3b, osteopenia, left hip fracture, who presents with fall and right hip pain after went to turn and lost her balance fell to the right side and struck her right hip on the edge of the bathtub. No LOC.  07/24: Admitted to hospitalist service w/ acute intertrochanteric right femoral neck fracture, and prior  left hip total arthroplasty. 07/25: SNF placement pending  07/26: insurance uth for [facility], pt stable fir discharge. Caution w/ low Hgb and needing DVT ppx  Consultants:  Orthopedics Cardiology for surgical clearance   Procedures: Reduction and internal fixation of displaced intertrochanteric right hip fracture 02/13/23 w/ Dr Joice Lofts      ASSESSMENT & PLAN:  Closed right hip fracture [facility]):  Acute intertrochanteric right femoral neck fracture No neurovascular compromise.  S/p Reduction and internal fixation of displaced intertrochanteric right hip fracture 02/13/23 pain control: prn percocet and tylenol Robaxin for muscle spasm PT/OT at SNF   Abnormal EKG w/ T-wave inversions Echocardiogram --> without concerning abnormalities  Troponin --> no concerns Monitor outpatient, consider repeat EKG in 2-4 weeks  Postoperative anemia: Hgb 13.0 --> 8.5 May be hemodilutional component Eliquis for DVT ppx Trend HH/CBC Caution for bleeding/hypotension, but DVT ppx felt to be necessary so will continue Eliquis in absence of overt bleed but close monitoring outpatient is warranted   Leukocytosis:  WBC 12.9, Likely due to stress-induced demargination.  Patient does not have signs of infection. monitor CBC   Fall at home, initial encounter PT/OT at SNF   Chronic a-fib [facility]): HR 67 Continue Amiodarone, Cardizem, metoprolol, eliquis    Chronic diastolic CHF (congestive heart failure) (HCC):  2D echo 06/06/2022 showed EF 60 to 65%.  Patient has trace leg edema, no JVD, BNP 350, does not seem to have CHF exacerbation. Continue home Lasix  Essential hypertension Lasix, Cardizem, metoprolol,   Type II diabetes mellitus with renal manifestations (HCC):  A1c 6.5 recently.  Well-controlled.   Resume home glipizide and metoprolol   COPD (chronic obstructive pulmonary disease) (HCC):  Stable As needed bronchodilators   Stage 3b chronic kidney disease (HCC):  Stable.  Recent baseline  creatinine 1.6-7.9.  Her creatinine is 7.74, BUN 43, GFR 28 Follow BMP             Discharge Instructions  Allergies as of 02/15/2023       Reactions   Baclofen Other (See Comments)   Elevated Cr   Simvastatin Other (See Comments)   Pt denies Elevated LFTs with simva 40mg , stopped and resolved (see MD note 11/10/13)        Medication List     TAKE these medications    albuterol 108 (90 Base) MCG/ACT inhaler Commonly known as: VENTOLIN HFA Inhale 2 puffs into the lungs every 6 (six) hours as needed.   amiodarone 200 MG tablet Commonly known as: PACERONE Take 200 mg by mouth daily.   apixaban 2.5 MG Tabs tablet Commonly known as: ELIQUIS Take 1 tablet (2.5 mg total) by mouth 2 (two) times daily.   ascorbic acid 250 MG Chew Commonly known as: VITAMIN C Chew 250 mg by mouth daily.   B-12 2500 MCG Tabs Take 2,500 mcg by mouth daily.   bisacodyl 10 MG suppository Commonly known as: DULCOLAX Place 1 suppository (10 mg total) rectally daily as needed for moderate constipation.   colestipol 1 g tablet Commonly known as: COLESTID Take 1 tablet by mouth 2 (two) times daily.   diltiazem 360 MG 24 hr capsule Commonly known as: CARDIZEM CD Take 360 mg by mouth daily.   docusate sodium 100 MG capsule Commonly known as: COLACE Take 1 capsule (100 mg total) by mouth 2 (two) times daily.   feeding supplement Liqd Take 237 mLs by mouth 2 (two) times daily between meals.   furosemide 20 MG tablet Commonly known as: LASIX Take 20 mg by mouth daily.   glipiZIDE 2.5 MG 24 hr tablet Commonly known as: GLUCOTROL XL Take 2.5 mg by mouth daily with breakfast.   ipratropium-albuterol 0.5-2.5 (3) MG/3ML Soln Commonly known as: DUONEB Inhale 3 mLs into the lungs every 6 (six) hours as needed.   Magnesium Oxide -Mg Supplement 500 MG Caps Take 500 mg by mouth daily.   methocarbamol 500 MG tablet Commonly known as: ROBAXIN Take 1 tablet (500 mg total) by mouth  every 8 (eight) hours as needed for muscle spasms.   metoprolol succinate 25 MG 24 hr tablet Commonly known as: TOPROL-XL Take 1 tablet (25 mg total) by mouth daily.   metoprolol tartrate 25 MG tablet Commonly known as: LOPRESSOR Take 1 tablet (25 mg total) by mouth 2 (two) times daily.   multivitamin with minerals Tabs tablet Take 1 tablet by mouth daily. One-A-Day Women's Vitamin   omeprazole 20 MG capsule Commonly known as: PRILOSEC Take 20 mg by mouth every morning.   oxyCODONE-acetaminophen 5-325 MG tablet Commonly known as: PERCOCET/ROXICET Take 1 tablet by mouth every 4 (four) hours as needed for moderate pain.   traZODone 100 MG tablet Commonly known as: DESYREL Take 100 mg by mouth at bedtime.   Trelegy Ellipta 100-62.5-25 MCG/ACT Aepb Generic drug: Fluticasone-Umeclidin-Vilant Inhale 1 puff into the lungs daily.   Vitamin D-1000 Max St 25 MCG (1000 UT) tablet Generic drug: Cholecalciferol Take 1,000 Units by mouth daily.  Follow-up Information     Anson Oregon, PA-C Follow up in 14 day(s).   Specialty: Physician Assistant Why: Ladell Heads Contact information: [address] [place] [address] [phone]                 Allergies  Allergen Reactions   Baclofen Other (See Comments)    Elevated Cr   Simvastatin Other (See Comments)    Pt denies  Elevated LFTs with simva 40mg , stopped and resolved (see MD note 11/10/13)     Subjective: pt reports doing well today, no CP/SOB, tolerating diet, no palpitations or dizziness, pain is controlled   Discharge Exam: BP 136/63 (BP Location: Right Arm)   Pulse 63   Temp 98.3 F (36.8 C)   Resp 16   Ht 5\' 9"  (1.753 m)   Wt 81.2 kg Comment: pt reported she took at home AM of 7/24  SpO2 92%   BMI 26.43 kg/m  General: Pt is alert, awake, not in acute distress Cardiovascular: RRR, S1/S2 +, no rubs, no gallops Respiratory: CTA bilaterally, no wheezing, no  rhonchi Abdominal: Soft, NT, ND, bowel sounds + Extremities: no edema, no cyanosis     The results of significant diagnostics from this hospitalization (including imaging, microbiology, ancillary and laboratory) are listed below for reference.     Microbiology: No results found for this or any previous visit (from the past 240 hour(s)).   Labs: BNP (last 3 results) Recent Labs    08/27/22 0809 11/05/22 1638 02/13/23 0953  BNP 293.9* 391.3* 350.5*   Basic Metabolic Panel: Recent Labs  Lab 02/13/23 0737 02/13/23 1238 02/14/23 0539  NA 135  --  132*  K 4.1 4.1 4.6  CL 107  --  104  CO2 21*  --  22  GLUCOSE 133*  --  233*  BUN 43*  --  52*  CREATININE 1.74*  --  2.19*  CALCIUM 8.6*  --  7.7*   Liver Function Tests: No results for input(s): "AST", "ALT", "ALKPHOS", "BILITOT", "PROT", "ALBUMIN" in the last 168 hours. No results for input(s): "LIPASE", "AMYLASE" in the last 168 hours. No results for input(s): "AMMONIA" in the last 168 hours. CBC: Recent Labs  Lab 02/13/23 0737 02/14/23 0539 02/15/23 0410  WBC 12.9* 12.2* 16.8*  NEUTROABS 10.2*  --   --   HGB 13.0 8.5* 7.9*  HCT 36.5 25.0* 22.9*  MCV 88.8 89.9 91.2  PLT 202 162 150   Cardiac Enzymes: Recent Labs  Lab 02/13/23 0953  CKTOTAL 93   BNP: Invalid input(s): "POCBNP" CBG: Recent Labs  Lab 02/14/23 0753 02/14/23 1114 02/14/23 1708 02/14/23 2127 02/15/23 0801  GLUCAP 210* 240* 238* 279* 187*   D-Dimer No results for input(s): "DDIMER" in the last 72 hours. Hgb A1c No results for input(s): "HGBA1C" in the last 72 hours. Lipid Profile No results for input(s): "CHOL", "HDL", "LDLCALC", "TRIG", "CHOLHDL", "LDLDIRECT" in the last 72 hours. Thyroid function studies No results for input(s): "TSH", "T4TOTAL", "T3FREE", "THYROIDAB" in the last 72 hours.  Invalid input(s): "FREET3" Anemia work up No results for input(s): "VITAMINB12", "FOLATE", "FERRITIN", "TIBC", "IRON", "RETICCTPCT" in the  last 72 hours. Urinalysis    Component Value Date/Time   COLORURINE AMBER (A) 11/24/2022 2239   APPEARANCEUR CLOUDY (A) 11/24/2022 2239   APPEARANCEUR Clear 02/19/2013 0254   LABSPEC 1.016 11/24/2022 2239   LABSPEC 1.006 02/19/2013 0254   PHURINE 5.0 11/24/2022 2239   GLUCOSEU NEGATIVE 11/24/2022 2239   GLUCOSEU Negative 02/19/2013  0254   HGBUR NEGATIVE 11/24/2022 2239   BILIRUBINUR NEGATIVE 11/24/2022 2239   BILIRUBINUR Negative 02/19/2013 0254   KETONESUR 5 (A) 11/24/2022 2239   PROTEINUR 100 (A) 11/24/2022 2239   NITRITE NEGATIVE 11/24/2022 2239   LEUKOCYTESUR MODERATE (A) 11/24/2022 2239   LEUKOCYTESUR 1+ 02/19/2013 0254   Sepsis Labs Recent Labs  Lab 02/13/23 0737 02/14/23 0539 02/15/23 0410  WBC 12.9* 12.2* 16.8*   Microbiology No results found for this or any previous visit (from the past 240 hour(s)). Imaging DG FEMUR, MIN 2 VIEWS RIGHT  Result Date: 02/13/2023 CLINICAL DATA:  Right intratrochanteric fracture EXAM: RIGHT FEMUR 2 VIEWS COMPARISON:  02/13/2023 FLUOROSCOPY TIME:  Radiation Exposure Index (as provided by the fluoroscopic device): 10.53 mGy If the device does not provide the exposure index: Fluoroscopy Time:  1 minute 4 seconds Number of Acquired Images:  4 FINDINGS: Proximal medullary rod is noted with fixation screw traversing the right femoral neck. Fracture fragments are in near anatomic alignment. The distal aspect the medullary rod is visualized with single fixation screw. IMPRESSION: ORIF of right intratrochanteric fracture. Electronically Signed   By: Alcide Clever M.D.   On: 02/13/2023 18:14   ECHOCARDIOGRAM COMPLETE  Result Date: 02/13/2023    ECHOCARDIOGRAM REPORT   Patient Name:   JALAYLA BIAGI Gardin Date of Exam: 02/13/2023 Medical Rec #:  [id number]     Height:       69.0 in Accession #:    [id number]    Weight:       196.2 lb Date of Birth:  Jan 06, 1933      BSA:          2.049 m Patient Age:    89 years      BP:           163/74 mmHg Patient Gender: F              HR:           74 bpm. Exam Location:  [facility] Procedure: 2D Echo, Cardiac Doppler and Color Doppler STAT ECHO Indications:     Abnormal ECG R94.31  History:         Patient has prior history of Echocardiogram examinations, most                  recent 06/06/2022. Arrythmias:Atrial Fibrillation; Risk                  Factors:Diabetes and Hypertension.  Sonographer:     Cristela Blue Referring Phys:  [id number] LILY MICHELLE TANG Diagnosing Phys: Alwyn Pea MD IMPRESSIONS  1. Left ventricular ejection fraction, by estimation, is 60 to 65%. The left ventricle has normal function. The left ventricle has no regional wall motion abnormalities. There is moderate concentric left ventricular hypertrophy. Left ventricular diastolic parameters are consistent with Grade I diastolic dysfunction (impaired relaxation).  2. Right ventricular systolic function is normal. The right ventricular size is normal.  3. The mitral valve is grossly normal. Mild mitral valve regurgitation.  4. The aortic valve is calcified. Aortic valve regurgitation is trivial. Aortic valve sclerosis/calcification is present, without any evidence of aortic stenosis. FINDINGS  Left Ventricle: Left ventricular ejection fraction, by estimation, is 60 to 65%. The left ventricle has normal function. The left ventricle has no regional wall motion abnormalities. The left ventricular internal cavity size was normal in size. There is  moderate concentric left ventricular hypertrophy. Left ventricular diastolic parameters are consistent with Grade I diastolic dysfunction (impaired relaxation).  Right Ventricle: The right ventricular size is normal. No increase in right ventricular wall thickness. Right ventricular systolic function is normal. Left Atrium: Left atrial size was normal in size. Right Atrium: Right atrial size was normal in size. Pericardium: There is no evidence of pericardial effusion. Mitral Valve: The mitral valve is grossly normal. There is  mild thickening of the mitral valve leaflet(s). There is mild calcification of the mitral valve leaflet(s). Mild to moderate mitral annular calcification. Mild mitral valve regurgitation. MV peak  gradient, 6.2 mmHg. The mean mitral valve gradient is 2.0 mmHg. Tricuspid Valve: The tricuspid valve is normal in structure. Tricuspid valve regurgitation is trivial. Aortic Valve: The aortic valve is calcified. Aortic valve regurgitation is trivial. Aortic valve sclerosis/calcification is present, without any evidence of aortic stenosis. Aortic valve mean gradient measures 11.7 mmHg. Aortic valve peak gradient measures 19.5 mmHg. Aortic valve area, by VTI measures 1.80 cm. Pulmonic Valve: The pulmonic valve was normal in structure. Pulmonic valve regurgitation is not visualized. Aorta: The ascending aorta was not well visualized. IAS/Shunts: No atrial level shunt detected by color flow Doppler.  LEFT VENTRICLE PLAX 2D LVIDd:         4.50 cm   Diastology LVIDs:         2.60 cm   LV e' medial:    3.70 cm/s LV PW:         1.70 cm   LV E/e' medial:  23.5 LV IVS:        1.30 cm   LV e' lateral:   3.70 cm/s LVOT diam:     2.00 cm   LV E/e' lateral: 23.5 LV SV:         81 LV SV Index:   40 LVOT Area:     3.14 cm  RIGHT VENTRICLE RV Basal diam:  2.90 cm RV Mid diam:    2.30 cm RV S prime:     14.40 cm/s TAPSE (M-mode): 2.1 cm LEFT ATRIUM           Index        RIGHT ATRIUM           Index LA diam:      3.80 cm 1.85 cm/m   RA Area:     11.70 cm LA Vol (A2C): 30.0 ml 14.64 ml/m  RA Volume:   23.00 ml  11.22 ml/m LA Vol (A4C): 31.4 ml 15.32 ml/m  AORTIC VALVE AV Area (Vmax):    1.52 cm AV Area (Vmean):   1.64 cm AV Area (VTI):     1.80 cm AV Vmax:           221.00 cm/s AV Vmean:          157.667 cm/s AV VTI:            0.451 m AV Peak Grad:      19.5 mmHg AV Mean Grad:      11.7 mmHg LVOT Vmax:         107.00 cm/s LVOT Vmean:        82.200 cm/s LVOT VTI:          0.258 m LVOT/AV VTI ratio: 0.57  AORTA Ao Root diam: 3.20 cm  MITRAL VALVE                TRICUSPID VALVE MV Area (PHT): 2.02 cm     TR Peak grad:   16.3 mmHg MV Area VTI:   2.06 cm     TR  Vmax:        202.00 cm/s MV Peak grad:  6.2 mmHg MV Mean grad:  2.0 mmHg     SHUNTS MV Vmax:       1.25 m/s     Systemic VTI:  0.26 m MV Vmean:      72.5 cm/s    Systemic Diam: 2.00 cm MV Decel Time: 375 msec MV E velocity: 87.00 cm/s MV A velocity: 127.00 cm/s MV E/A ratio:  0.69 Dwayne Salome Arnt MD Electronically signed by Alwyn Pea MD Signature Date/Time: 02/13/2023/4:50:20 PM    Final    DG C-Arm 1-60 Min-No Report  Result Date: 02/13/2023 Fluoroscopy was utilized by the requesting physician.  No radiographic interpretation.   DG C-Arm 1-60 Min-No Report  Result Date: 02/13/2023 Fluoroscopy was utilized by the requesting physician.  No radiographic interpretation.   DG Hip Unilat W or Wo Pelvis 2-3 Views Right  Result Date: 02/13/2023 CLINICAL DATA:  Right hip pain EXAM: DG HIP (WITH OR WITHOUT PELVIS) 3V RIGHT COMPARISON:  None Available. FINDINGS: Acute intertrochanteric femoral neck fracture extending through the lesser trochanter. The femoral head remains located within the acetabulum. Surgical changes of prior left hip arthroplasty are evident. Atherosclerotic vascular calcifications visualized in the bilateral superficial femoral arteries. The bony pelvis appears intact. Mineralization is low suggesting underlying osteoporosis. Unremarkable bowel gas pattern. IMPRESSION: 1. Positive for acute intertrochanteric right femoral neck fracture. 2. Surgical changes of prior left hip total arthroplasty. 3. Peripheral arterial disease. Electronically Signed   By: Malachy Moan M.D.   On: 02/13/2023 07:29   DG Chest 1 View  Result Date: 02/13/2023 CLINICAL DATA:  [id number].  Fall injury, initial encounter. EXAM: CHEST  1 VIEW COMPARISON:  Portable chest 11/28/2022 FINDINGS: Left chest tube pacing system and wire insertions are unchanged. Mild cardiomegaly. No  evidence of CHF. The aorta is tortuous and calcified with stable mediastinum. The lungs are mildly emphysematous but clear.  The sulci are sharp. There are no new osseous findings with osteopenia and again seen reverse right shoulder arthroplasty, mild thoracic dextroscoliosis and degenerative changes. IMPRESSION: 1. No evidence of acute chest disease.  Stable COPD chest. 2. Aortic atherosclerosis and uncoiling. Electronically Signed   By: Almira Bar M.D.   On: 02/13/2023 07:28      Time coordinating discharge: over 30 minutes  SIGNED:  Sunnie Nielsen DO [facility]

## 2023-02-15 NOTE — Progress Notes (Signed)
Subjective: 2 Days Post-Op Procedure(s) (LRB): INTRAMEDULLARY (IM) NAIL INTERTROCHANTERIC (Right) Patient reports pain as mild this morning but difficulty with PT. Patient is well, and has had no acute complaints or problems PT recommending SNF.  Insurance approval pending for discharge to [facility]. Negative for chest pain and shortness of breath Fever: no Gastrointestinal:Negative for nausea and vomiting  Objective: Vital signs in last 24 hours: Temp:  [97.5 F (36.4 C)-99 F (37.2 C)] 97.8 F (36.6 C) (07/25 2327) Pulse Rate:  [60-64] 63 (07/25 2327) Resp:  [16-20] 20 (07/25 2327) BP: (106-117)/(42-86) 117/53 (07/25 2327) SpO2:  [93 %-97 %] 97 % (07/25 2327) Weight:  [81.2 kg] 81.2 kg (07/25 1018)  Intake/Output from previous day:  Intake/Output Summary (Last 24 hours) at 02/15/2023 0734 Last data filed at 02/15/2023 0517 Gross per 24 hour  Intake 900 ml  Output 200 ml  Net 700 ml    Intake/Output this shift: No intake/output data recorded.  Labs: Recent Labs    02/13/23 0737 02/14/23 0539 02/15/23 0410  HGB 13.0 8.5* 7.9*   Recent Labs    02/14/23 0539 02/15/23 0410  WBC 12.2* 16.8*  RBC 2.78* 2.51*  HCT 25.0* 22.9*  PLT 162 150   Recent Labs    02/13/23 0737 02/13/23 1238 02/14/23 0539  NA 135  --  132*  K 4.1 4.1 4.6  CL 107  --  104  CO2 21*  --  22  BUN 43*  --  52*  CREATININE 1.74*  --  2.19*  GLUCOSE 133*  --  233*  CALCIUM 8.6*  --  7.7*   Recent Labs    02/13/23 0953  INR 1.1     EXAM General - Patient is Alert, Appropriate, and Oriented Extremity - ABD soft Neurovascular intact Dorsiflexion/Plantar flexion intact Incision: scant drainage No cellulitis present Dressing/Incision - Mild bloody drainage noted to the proximal incision site. Motor Function - intact, moving foot and toes well on exam.  Abdomen soft with intact bowel sounds.  Past Medical History:  Diagnosis Date   Asthma    Atrial fibrillation with RVR (HCC)     Cancer (HCC)    Basal Cell   Diabetes mellitus without complication (HCC)    Heart murmur    Hypertension    Impetigo    Osteoarthritis    Osteopenia    Vomiting    can not due to surgery   Wears dentures    full upper and lower    Assessment/Plan: 2 Days Post-Op Procedure(s) (LRB): INTRAMEDULLARY (IM) NAIL INTERTROCHANTERIC (Right) Principal Problem:   Closed right hip fracture (HCC) Active Problems:   Stage 3b chronic kidney disease (HCC)   Chronic a-fib (HCC)   Essential hypertension   Fall at home, initial encounter   Type II diabetes mellitus with renal manifestations (HCC)   Chronic diastolic CHF (congestive heart failure) (HCC)   COPD (chronic obstructive pulmonary disease) (HCC)   Leukocytosis  Estimated body mass index is 26.43 kg/m as calculated from the following:   Height as of this encounter: 5\' 9"  (1.753 m).   Weight as of this encounter: 81.2 kg. Advance diet Up with therapy D/C IV fluids when tolerating po intake.  Labs reviewed this AM.  WBC 16.8.  Hg 7.9 this morning. No signs of active bleeding noted to the right leg. WBC 16.8, denies any chest pain or urinary symptoms this morning. Up with therapy today.  Current plan is for d/c to SNF.  Insurance pending for [facility]. She  is passing gas this morning, continue to work on BM.  Following discharge continue Eliquis for DVT prophylaxis. Follow-up with [facility] in 10-14 days for staple removal.  DVT Prophylaxis - TED hose and Eliquis Weight-Bearing as tolerated to right leg  J. Horris Latino, PA-C [facility] 02/15/2023, 7:34 AM

## 2023-02-15 NOTE — TOC Progression Note (Signed)
Transition of Care [facility]) - Progression Note    Patient Details  Name: CORAMAE KELLENBERGER MRN: [id number] Date of Birth: [birth year]  [facility] CM/SW Contact  Marlowe Sax, RN Phone Number: 02/15/2023, 9:09 AM  Clinical Narrative:     Ins approved to go to [facility] today [id number]  Expected Discharge Plan: Skilled Nursing Facility Barriers to Discharge: Insurance Authorization  Expected Discharge Plan and Services   Discharge Planning Services: CM Consult   Living arrangements for the past 2 months: Single Family Home                                       Social Determinants of Health (SDOH) Interventions SDOH Screenings   Food Insecurity: No Food Insecurity (02/13/2023)  Housing: Low Risk  (02/13/2023)  Transportation Needs: No Transportation Needs (02/13/2023)  Utilities: Not At Risk (02/13/2023)  Tobacco Use: Low Risk  (02/13/2023)    Readmission Risk Interventions    08/27/2022   12:05 PM  Readmission Risk Prevention Plan  Transportation Screening Complete  PCP or Specialist Appt within 3-5 Days Complete  HRI or Home Care Consult Complete  Medication Review (RN Care Manager) Complete

## 2023-02-15 NOTE — Care Management Important Message (Signed)
Important Message  Patient Details  Name: Leslie Parker MRN: [id number] Date of Birth: 1933-02-21   Medicare Important Message Given:  N/A - LOS <3 / Initial given by admissions     [name] A Elek Holderness 02/15/2023, 10:21 AM

## 2023-02-15 NOTE — Discharge Instructions (Signed)
Diet: As you were doing prior to hospitalization  ° °Shower:  May shower but keep the wounds dry, use an occlusive plastic wrap, NO SOAKING IN TUB.  If the bandage gets wet, change with a clean dry gauze. ° °Dressing:  You may change your dressing as needed. Change the dressing with sterile gauze dressing.   ° °Activity:  Increase activity slowly as tolerated, but follow the weight bearing instructions below.  No lifting or driving for 6 weeks. ° °Weight Bearing:   Weight bearing as tolerated to right lower extremity ° °To prevent constipation: you may use a stool softener such as - ° °Colace (over the counter) 100 mg by mouth twice a day  °Drink plenty of fluids (prune juice may be helpful) and high fiber foods °Miralax (over the counter) for constipation as needed.   ° °Itching:  If you experience itching with your medications, try taking only a single pain pill, or even half a pain pill at a time.  You may take up to 10 pain pills per day, and you can also use benadryl over the counter for itching or also to help with sleep.  ° °Precautions:  If you experience chest pain or shortness of breath - call 911 immediately for transfer to the hospital emergency department!! ° °If you develop a fever greater that 101 F, purulent drainage from wound, increased redness or drainage from wound, or calf pain-Call [facility]                                              °Follow- Up Appointment:  Please call for an appointment to be seen in 2 weeks at [facility] °

## 2023-02-15 NOTE — Progress Notes (Signed)
Physical Therapy Treatment Patient Details Name: Leslie Parker MRN: [id number] DOB: June 28, 1933 Today's Date: 02/15/2023   History of Present Illness Patient is a 89 year old female with fall and closed displaced intertrochanteric fracture of R hip s/p IM nail. History of chronic atrial fibrillation, COPD, renal insufficiency, diabetes, HTN, osteopenia.    PT Comments  Patient is agreeable to PT. She continues to require assistance with bed mobility. Sitting balance is poor without unilateral UE support. Patient declined standing and will require +2 person assistance to safely stand from the bed. Activity tolerance limited by fatigue. Recommend to continue PT to maximize independence and facilitate return to prior level of function.     Assistance Recommended at Discharge Frequent or constant Supervision/Assistance  If plan is discharge home, recommend the following:  Can travel by private vehicle    Two people to help with walking and/or transfers;A lot of help with bathing/dressing/bathroom;Help with stairs or ramp for entrance;Assist for transportation   No  Equipment Recommendations   (to be determined at next level of care)    Recommendations for Other Services       Precautions / Restrictions Precautions Precautions: Fall Restrictions Weight Bearing Restrictions: Yes RLE Weight Bearing: Weight bearing as tolerated     Mobility  Bed Mobility Overal bed mobility: Needs Assistance Bed Mobility: Supine to Sit, Sit to Supine     Supine to sit: Max assist, HOB elevated Sit to supine: HOB elevated, Total assist   General bed mobility comments: assistance for trunk and BLE support. verbal cues for technique    Transfers                   General transfer comment: patient declined due to fatigue, will need +2 person assistance for transfers at this time    Ambulation/Gait                   Stairs             Wheelchair Mobility     Tilt Bed     Modified Rankin (Stroke Patients Only)       Balance Overall balance assessment: Needs assistance Sitting-balance support: Feet supported, Single extremity supported Sitting balance-[name]: Poor Sitting balance - Comments: patient using RUE to hold bed rail to maintain sitting balance with posterior lean without UE support.                                    Cognition Arousal/Alertness: Awake/alert Behavior During Therapy: WFL for tasks assessed/performed Overall Cognitive Status: No family/caregiver present to determine baseline cognitive functioning                                 General Comments: patient is able to follow single step commands with increased time.        Exercises Total Joint Exercises Ankle Circles/Pumps: AROM, Strengthening, Right, 10 reps, Supine Hip ABduction/ADduction: AAROM, Strengthening, Right, 10 reps, Supine Other Exercises Other Exercises: verbal cues for exercise technique    General Comments        Pertinent Vitals/Pain Pain Assessment Pain Assessment: Faces Faces Pain Scale: Hurts little more Pain Location: R hip with ROM Pain Descriptors / Indicators: Discomfort, Grimacing, Operative site guarding Pain Intervention(s): Limited activity within patient's tolerance, Monitored during session, Repositioned    Home Living  Prior Function            PT Goals (current goals can now be found in the care plan section) Acute Rehab PT Goals Patient Stated Goal: to have less pain PT Goal Formulation: With patient Time For Goal Achievement: 02/28/23 Potential to Achieve Goals: Fair Progress towards PT goals: Progressing toward goals    Frequency    Min 1X/week      PT Plan Current plan remains appropriate    Co-evaluation              AM-PAC PT "6 Clicks" Mobility   Outcome Measure  Help needed turning from your back to your side while in a flat bed  without using bedrails?: A Lot Help needed moving from lying on your back to sitting on the side of a flat bed without using bedrails?: Total Help needed moving to and from a bed to a chair (including a wheelchair)?: Total Help needed standing up from a chair using your arms (e.g., wheelchair or bedside chair)?: Total Help needed to walk in hospital room?: Total Help needed climbing 3-5 steps with a railing? : Total 6 Click Score: 7    End of Session   Activity Tolerance: Patient limited by fatigue Patient left: in bed;with call bell/phone within reach;with bed alarm set;with SCD's reapplied   PT Visit Diagnosis: Pain;Difficulty in walking, not elsewhere classified (R26.2) Pain - Right/Left: Right Pain - part of body: Hip     Time: 3244-0102 PT Time Calculation (min) (ACUTE ONLY): 21 min  Charges:    $Therapeutic Activity: 8-22 mins PT General Charges $$ ACUTE PT VISIT: 1 Visit                    Donna Bernard, PT, MPT    Ina Homes 02/15/2023, 11:02 AM

## 2023-02-15 NOTE — Consult Note (Signed)
   [facility] Inpatient Consult   02/15/2023  Leslie Parker [birth year] [id number]  Primary Care Provider:  Dr. [name]  Patient is currently active with Care Management for chronic disease management services.  Patient has been engaged by a care coordinator.  Our community based plan of care has focused on disease management and community resource support.   Patient will receive a post hospital call and will be evaluated for assessments and disease process education.   Plan: Pt discharge to SNF and RN liaison collaborated with the involved RN care coordinator and PAC-RN of pt's disposition.   Of note, Care Management services does not replace or interfere with any services that are needed or arranged by inpatient [facility] care management team.   For additional questions or referrals please contact:  Elliot Cousin, RN, [facility] Liaison [place]   Population Health Office Hours MTWF  8:00 am-6:00 pm Off on Thursday [phone] mobile [phone] [Office toll free line] Office Hours are M-F 8:30 - 5 pm 24 hour nurse advise line [phone] Concierge  [email or web address]

## 2023-02-18 DIAGNOSIS — I10 Essential (primary) hypertension: Secondary | ICD-10-CM | POA: Diagnosis not present

## 2023-02-18 DIAGNOSIS — E119 Type 2 diabetes mellitus without complications: Secondary | ICD-10-CM | POA: Diagnosis not present

## 2023-02-18 DIAGNOSIS — J449 Chronic obstructive pulmonary disease, unspecified: Secondary | ICD-10-CM | POA: Diagnosis not present

## 2023-02-19 DIAGNOSIS — D649 Anemia, unspecified: Secondary | ICD-10-CM | POA: Diagnosis not present

## 2023-02-19 DIAGNOSIS — Z79899 Other long term (current) drug therapy: Secondary | ICD-10-CM | POA: Diagnosis not present

## 2023-02-20 DIAGNOSIS — R945 Abnormal results of liver function studies: Secondary | ICD-10-CM | POA: Diagnosis not present

## 2023-02-20 DIAGNOSIS — E43 Unspecified severe protein-calorie malnutrition: Secondary | ICD-10-CM | POA: Diagnosis not present

## 2023-02-20 DIAGNOSIS — D649 Anemia, unspecified: Secondary | ICD-10-CM | POA: Diagnosis not present

## 2023-02-20 DIAGNOSIS — N1832 Chronic kidney disease, stage 3b: Secondary | ICD-10-CM | POA: Diagnosis not present

## 2023-02-22 DIAGNOSIS — R600 Localized edema: Secondary | ICD-10-CM | POA: Diagnosis not present

## 2023-02-22 DIAGNOSIS — D649 Anemia, unspecified: Secondary | ICD-10-CM | POA: Diagnosis not present

## 2023-02-22 DIAGNOSIS — S72141D Displaced intertrochanteric fracture of right femur, subsequent encounter for closed fracture with routine healing: Secondary | ICD-10-CM | POA: Diagnosis not present

## 2023-02-22 DIAGNOSIS — I10 Essential (primary) hypertension: Secondary | ICD-10-CM | POA: Diagnosis not present

## 2023-02-22 DIAGNOSIS — N1832 Chronic kidney disease, stage 3b: Secondary | ICD-10-CM | POA: Diagnosis not present

## 2023-02-25 DIAGNOSIS — R110 Nausea: Secondary | ICD-10-CM | POA: Diagnosis not present

## 2023-02-25 DIAGNOSIS — I5032 Chronic diastolic (congestive) heart failure: Secondary | ICD-10-CM | POA: Diagnosis not present

## 2023-02-25 DIAGNOSIS — R600 Localized edema: Secondary | ICD-10-CM | POA: Diagnosis not present

## 2023-02-25 DIAGNOSIS — S72141D Displaced intertrochanteric fracture of right femur, subsequent encounter for closed fracture with routine healing: Secondary | ICD-10-CM | POA: Diagnosis not present

## 2023-02-27 DIAGNOSIS — R945 Abnormal results of liver function studies: Secondary | ICD-10-CM | POA: Diagnosis not present

## 2023-02-27 DIAGNOSIS — R600 Localized edema: Secondary | ICD-10-CM | POA: Diagnosis not present

## 2023-02-27 DIAGNOSIS — S72141D Displaced intertrochanteric fracture of right femur, subsequent encounter for closed fracture with routine healing: Secondary | ICD-10-CM | POA: Diagnosis not present

## 2023-02-27 DIAGNOSIS — R110 Nausea: Secondary | ICD-10-CM | POA: Diagnosis not present

## 2023-02-28 DIAGNOSIS — D649 Anemia, unspecified: Secondary | ICD-10-CM | POA: Diagnosis not present

## 2023-03-01 ENCOUNTER — Ambulatory Visit: Payer: Self-pay | Admitting: *Deleted

## 2023-03-01 NOTE — Patient Outreach (Signed)
  Care Coordination   Follow Up Visit Note   03/01/2023 Name: MALTA DOERSAM MRN: [id number] DOB: May 04, 1933  KEMARIE BIGGAR is a 89 y.o. year old female who sees [name], MD for primary care.   What matters to the patients health and wellness today?  Patient admitted to hospital 7/24 for fall, discharged to SNF on 7/26.  Call placed to [facility], confirmed patient is still resident.    SDOH assessments and interventions completed:  No     Care Coordination Interventions:  No, not indicated   Follow up plan:  Will follow up in 2 weeks to see if patient has discharged home from SNF    Encounter Outcome:  Pt. Visit Completed   Kemper Durie, RN, MSN, [facility] Care Management Care Management Coordinator [phone]

## 2023-03-04 DIAGNOSIS — L03211 Cellulitis of face: Secondary | ICD-10-CM | POA: Diagnosis not present

## 2023-03-04 DIAGNOSIS — S72141D Displaced intertrochanteric fracture of right femur, subsequent encounter for closed fracture with routine healing: Secondary | ICD-10-CM | POA: Diagnosis not present

## 2023-03-04 DIAGNOSIS — L89611 Pressure ulcer of right heel, stage 1: Secondary | ICD-10-CM | POA: Diagnosis not present

## 2023-03-05 DIAGNOSIS — S72141D Displaced intertrochanteric fracture of right femur, subsequent encounter for closed fracture with routine healing: Secondary | ICD-10-CM | POA: Diagnosis not present

## 2023-03-05 DIAGNOSIS — R945 Abnormal results of liver function studies: Secondary | ICD-10-CM | POA: Diagnosis not present

## 2023-03-05 DIAGNOSIS — L89616 Pressure-induced deep tissue damage of right heel: Secondary | ICD-10-CM | POA: Diagnosis not present

## 2023-03-05 DIAGNOSIS — L03211 Cellulitis of face: Secondary | ICD-10-CM | POA: Diagnosis not present

## 2023-03-07 DIAGNOSIS — L89612 Pressure ulcer of right heel, stage 2: Secondary | ICD-10-CM | POA: Diagnosis not present

## 2023-03-07 DIAGNOSIS — L03211 Cellulitis of face: Secondary | ICD-10-CM | POA: Diagnosis not present

## 2023-03-07 DIAGNOSIS — L89616 Pressure-induced deep tissue damage of right heel: Secondary | ICD-10-CM | POA: Diagnosis not present

## 2023-03-07 DIAGNOSIS — E119 Type 2 diabetes mellitus without complications: Secondary | ICD-10-CM | POA: Diagnosis not present

## 2023-03-07 DIAGNOSIS — R945 Abnormal results of liver function studies: Secondary | ICD-10-CM | POA: Diagnosis not present

## 2023-03-07 DIAGNOSIS — S72141D Displaced intertrochanteric fracture of right femur, subsequent encounter for closed fracture with routine healing: Secondary | ICD-10-CM | POA: Diagnosis not present

## 2023-03-08 DIAGNOSIS — R52 Pain, unspecified: Secondary | ICD-10-CM | POA: Diagnosis not present

## 2023-03-08 DIAGNOSIS — S72001D Fracture of unspecified part of neck of right femur, subsequent encounter for closed fracture with routine healing: Secondary | ICD-10-CM | POA: Diagnosis not present

## 2023-03-08 DIAGNOSIS — R062 Wheezing: Secondary | ICD-10-CM | POA: Diagnosis not present

## 2023-03-08 DIAGNOSIS — E785 Hyperlipidemia, unspecified: Secondary | ICD-10-CM | POA: Diagnosis not present

## 2023-03-08 DIAGNOSIS — M6259 Muscle wasting and atrophy, not elsewhere classified, multiple sites: Secondary | ICD-10-CM | POA: Diagnosis not present

## 2023-03-08 DIAGNOSIS — I4891 Unspecified atrial fibrillation: Secondary | ICD-10-CM | POA: Diagnosis not present

## 2023-03-08 DIAGNOSIS — M62838 Other muscle spasm: Secondary | ICD-10-CM | POA: Diagnosis not present

## 2023-03-08 DIAGNOSIS — G4700 Insomnia, unspecified: Secondary | ICD-10-CM | POA: Diagnosis not present

## 2023-03-08 DIAGNOSIS — R2681 Unsteadiness on feet: Secondary | ICD-10-CM | POA: Diagnosis not present

## 2023-03-11 DIAGNOSIS — G4700 Insomnia, unspecified: Secondary | ICD-10-CM | POA: Diagnosis not present

## 2023-03-11 DIAGNOSIS — M62838 Other muscle spasm: Secondary | ICD-10-CM | POA: Diagnosis not present

## 2023-03-11 DIAGNOSIS — S72001D Fracture of unspecified part of neck of right femur, subsequent encounter for closed fracture with routine healing: Secondary | ICD-10-CM | POA: Diagnosis not present

## 2023-03-11 DIAGNOSIS — I4891 Unspecified atrial fibrillation: Secondary | ICD-10-CM | POA: Diagnosis not present

## 2023-03-11 DIAGNOSIS — R52 Pain, unspecified: Secondary | ICD-10-CM | POA: Diagnosis not present

## 2023-03-11 DIAGNOSIS — M6259 Muscle wasting and atrophy, not elsewhere classified, multiple sites: Secondary | ICD-10-CM | POA: Diagnosis not present

## 2023-03-11 DIAGNOSIS — E785 Hyperlipidemia, unspecified: Secondary | ICD-10-CM | POA: Diagnosis not present

## 2023-03-11 DIAGNOSIS — R2681 Unsteadiness on feet: Secondary | ICD-10-CM | POA: Diagnosis not present

## 2023-03-11 DIAGNOSIS — R062 Wheezing: Secondary | ICD-10-CM | POA: Diagnosis not present

## 2023-03-12 DIAGNOSIS — M6259 Muscle wasting and atrophy, not elsewhere classified, multiple sites: Secondary | ICD-10-CM | POA: Diagnosis not present

## 2023-03-12 DIAGNOSIS — S72001D Fracture of unspecified part of neck of right femur, subsequent encounter for closed fracture with routine healing: Secondary | ICD-10-CM | POA: Diagnosis not present

## 2023-03-12 DIAGNOSIS — E785 Hyperlipidemia, unspecified: Secondary | ICD-10-CM | POA: Diagnosis not present

## 2023-03-12 DIAGNOSIS — G4700 Insomnia, unspecified: Secondary | ICD-10-CM | POA: Diagnosis not present

## 2023-03-12 DIAGNOSIS — R52 Pain, unspecified: Secondary | ICD-10-CM | POA: Diagnosis not present

## 2023-03-12 DIAGNOSIS — R062 Wheezing: Secondary | ICD-10-CM | POA: Diagnosis not present

## 2023-03-12 DIAGNOSIS — R2681 Unsteadiness on feet: Secondary | ICD-10-CM | POA: Diagnosis not present

## 2023-03-12 DIAGNOSIS — I4891 Unspecified atrial fibrillation: Secondary | ICD-10-CM | POA: Diagnosis not present

## 2023-03-12 DIAGNOSIS — M62838 Other muscle spasm: Secondary | ICD-10-CM | POA: Diagnosis not present

## 2023-03-15 ENCOUNTER — Telehealth: Payer: Self-pay | Admitting: *Deleted

## 2023-03-15 DIAGNOSIS — Z7984 Long term (current) use of oral hypoglycemic drugs: Secondary | ICD-10-CM | POA: Diagnosis not present

## 2023-03-15 DIAGNOSIS — I10 Essential (primary) hypertension: Secondary | ICD-10-CM | POA: Diagnosis not present

## 2023-03-15 DIAGNOSIS — Z96641 Presence of right artificial hip joint: Secondary | ICD-10-CM | POA: Diagnosis not present

## 2023-03-15 DIAGNOSIS — E119 Type 2 diabetes mellitus without complications: Secondary | ICD-10-CM | POA: Diagnosis not present

## 2023-03-15 DIAGNOSIS — L89616 Pressure-induced deep tissue damage of right heel: Secondary | ICD-10-CM | POA: Diagnosis not present

## 2023-03-15 DIAGNOSIS — Z9181 History of falling: Secondary | ICD-10-CM | POA: Diagnosis not present

## 2023-03-15 DIAGNOSIS — L89313 Pressure ulcer of right buttock, stage 3: Secondary | ICD-10-CM | POA: Diagnosis not present

## 2023-03-15 DIAGNOSIS — S72141D Displaced intertrochanteric fracture of right femur, subsequent encounter for closed fracture with routine healing: Secondary | ICD-10-CM | POA: Diagnosis not present

## 2023-03-15 NOTE — Patient Outreach (Signed)
  Care Coordination   Follow Up Visit Note   03/16/2023 Name: Leslie Parker MRN: [id number] DOB: [birth year]  Leslie Parker is a 89 y.o. year old female who sees Leslie Baas, MD for primary care. I spoke with  Hendricks Parker by phone today.  What matters to the patients health and wellness today?  Recover from recent fall, discharged from SNF on Wednesday   Goals Addressed             This Visit's Progress    Free from recurrent fall       Care Coordination Interventions: Provided written and verbal education re: potential causes of falls and Fall prevention strategies Reviewed medications and discussed potential side effects of medications such as dizziness and frequent urination Advised patient of importance of notifying provider of falls Assessed for falls since last encounter Assessed patients knowledge of fall risk prevention secondary to previously provided education      Management of COPD   On track     Interventions Today    Flowsheet Row Most Recent Value  Chronic Disease   Chronic disease during today's visit Chronic Obstructive Pulmonary Disease (COPD), Other  [fall]  General Interventions   General Interventions Discussed/Reviewed General Interventions Reviewed, Doctor Visits, Durable Medical Equipment (DME)  Doctor Visits Discussed/Reviewed Doctor Visits Reviewed, PCP, Specialist  [Advised that ortho appointment scheduled for 9/6 at 9am, state she does not have transportation that early in the morning and will call to reschedule for when her granddaughter is available, late afternoon]  Horticulturist, commercial (DME) Wheelchair, [place] [employer] she was told she would get a wheelchair delivered, this has not happened yet.  Will contact [employer] to follow up]  Wheelchair Standard  PCP/Specialist Visits Compliance with follow-up visit  Education Interventions   Education Provided Provided Education  Provided Verbal Education On Medication, When to see the doctor,  [facility] [place] with HHPT]  Safety Interventions   Safety Discussed/Reviewed Safety Reviewed, Fall Risk              SDOH assessments and interventions completed:  No     Care Coordination Interventions:  Yes, provided   Follow up plan: Follow up call scheduled for 9/11    Encounter Outcome:  Pt. Visit Completed   Leslie Durie, RN, MSN, [facility] Care Management Care Management Coordinator [phone]

## 2023-03-15 NOTE — Patient Outreach (Signed)
  Care Coordination   03/15/2023 Name: MKYA VOGAN MRN: [id number] DOB: 12/13/1932   Call placed to [facility] to confirm patient has discharged from SNF.   Care Coordination Outreach Attempts:  An unsuccessful telephone outreach was attempted today to offer the patient information about available care coordination services.   Calls were placed to patient directly and granddaughter, POA, [name].  Voice messages left.   Follow Up Plan:  Additional outreach attempts will be made to offer the patient care coordination information and services.   Encounter Outcome:  No Answer   Care Coordination Interventions:  No, not indicated    Kemper Durie, RN, MSN, [facility] Care Management Care Management Coordinator [phone]

## 2023-03-19 ENCOUNTER — Telehealth: Payer: Self-pay | Admitting: *Deleted

## 2023-03-19 DIAGNOSIS — L89313 Pressure ulcer of right buttock, stage 3: Secondary | ICD-10-CM | POA: Diagnosis not present

## 2023-03-19 DIAGNOSIS — Z7984 Long term (current) use of oral hypoglycemic drugs: Secondary | ICD-10-CM | POA: Diagnosis not present

## 2023-03-19 DIAGNOSIS — S72141D Displaced intertrochanteric fracture of right femur, subsequent encounter for closed fracture with routine healing: Secondary | ICD-10-CM | POA: Diagnosis not present

## 2023-03-19 DIAGNOSIS — Z9181 History of falling: Secondary | ICD-10-CM | POA: Diagnosis not present

## 2023-03-19 DIAGNOSIS — L89616 Pressure-induced deep tissue damage of right heel: Secondary | ICD-10-CM | POA: Diagnosis not present

## 2023-03-19 DIAGNOSIS — I10 Essential (primary) hypertension: Secondary | ICD-10-CM | POA: Diagnosis not present

## 2023-03-19 DIAGNOSIS — Z96641 Presence of right artificial hip joint: Secondary | ICD-10-CM | POA: Diagnosis not present

## 2023-03-19 DIAGNOSIS — E119 Type 2 diabetes mellitus without complications: Secondary | ICD-10-CM | POA: Diagnosis not present

## 2023-03-19 NOTE — Patient Outreach (Signed)
  Care Coordination   Follow Up Visit Note   03/19/2023 Name: Leslie Parker MRN: [id number] DOB: 1932-09-04  Leslie Parker is a 89 y.o. year old female who sees [name], MD for primary care.   What matters to the patients health and wellness today?  Spoke with Joy from [employer] for update on wheelchair, called patient to give details, voice message left.     Goals Addressed             This Visit's Progress    Free from recurrent fall   On track    Care Coordination Interventions: Provided written and verbal education re: potential causes of falls and Fall prevention strategies Reviewed medications and discussed potential side effects of medications such as dizziness and frequent urination Advised patient of importance of notifying provider of falls Assessed for falls since last encounter Assessed patients knowledge of fall risk prevention secondary to previously provided education         SDOH assessments and interventions completed:  No     Care Coordination Interventions:  Yes, provided   Interventions Today    Flowsheet Row Most Recent Value  Chronic Disease   Chronic disease during today's visit Other  [fall]  General Interventions   General Interventions Discussed/Reviewed Communication with  Durable Medical Equipment (DME) Wheelchair  Wheelchair Standard  [Spoke to [employer], wheelchair copay is $123.85, will need to place card on file to pay $6.84 for 12 months prior to delivery]       Follow up plan: Follow up call scheduled for 9/11    Encounter Outcome:  Pt. Visit Completed   Kemper Durie, RN, MSN, [facility] Care Management Care Management Coordinator [phone]

## 2023-03-20 ENCOUNTER — Emergency Department: Payer: Medicare HMO

## 2023-03-20 ENCOUNTER — Inpatient Hospital Stay
Admission: EM | Admit: 2023-03-20 | Discharge: 2023-04-01 | DRG: 189 | Disposition: A | Discharge status: Home Health | Payer: Medicare HMO | Attending: Internal Medicine | Admitting: Internal Medicine

## 2023-03-20 ENCOUNTER — Other Ambulatory Visit: Payer: Self-pay

## 2023-03-20 DIAGNOSIS — Z7984 Long term (current) use of oral hypoglycemic drugs: Secondary | ICD-10-CM

## 2023-03-20 DIAGNOSIS — Z7901 Long term (current) use of anticoagulants: Secondary | ICD-10-CM | POA: Diagnosis not present

## 2023-03-20 DIAGNOSIS — R001 Bradycardia, unspecified: Secondary | ICD-10-CM | POA: Diagnosis present

## 2023-03-20 DIAGNOSIS — Z96611 Presence of right artificial shoulder joint: Secondary | ICD-10-CM | POA: Diagnosis not present

## 2023-03-20 DIAGNOSIS — R059 Cough, unspecified: Secondary | ICD-10-CM | POA: Diagnosis not present

## 2023-03-20 DIAGNOSIS — E871 Hypo-osmolality and hyponatremia: Secondary | ICD-10-CM | POA: Diagnosis present

## 2023-03-20 DIAGNOSIS — R0602 Shortness of breath: Principal | ICD-10-CM

## 2023-03-20 DIAGNOSIS — I482 Chronic atrial fibrillation, unspecified: Secondary | ICD-10-CM | POA: Diagnosis not present

## 2023-03-20 DIAGNOSIS — E876 Hypokalemia: Secondary | ICD-10-CM | POA: Diagnosis present

## 2023-03-20 DIAGNOSIS — D649 Anemia, unspecified: Secondary | ICD-10-CM | POA: Diagnosis present

## 2023-03-20 DIAGNOSIS — J90 Pleural effusion, not elsewhere classified: Secondary | ICD-10-CM | POA: Diagnosis not present

## 2023-03-20 DIAGNOSIS — F419 Anxiety disorder, unspecified: Secondary | ICD-10-CM | POA: Diagnosis present

## 2023-03-20 DIAGNOSIS — R0902 Hypoxemia: Secondary | ICD-10-CM | POA: Diagnosis not present

## 2023-03-20 DIAGNOSIS — E669 Obesity, unspecified: Secondary | ICD-10-CM | POA: Diagnosis present

## 2023-03-20 DIAGNOSIS — I5033 Acute on chronic diastolic (congestive) heart failure: Secondary | ICD-10-CM | POA: Diagnosis not present

## 2023-03-20 DIAGNOSIS — Z90710 Acquired absence of both cervix and uterus: Secondary | ICD-10-CM

## 2023-03-20 DIAGNOSIS — I480 Paroxysmal atrial fibrillation: Secondary | ICD-10-CM | POA: Diagnosis present

## 2023-03-20 DIAGNOSIS — N179 Acute kidney failure, unspecified: Secondary | ICD-10-CM | POA: Diagnosis present

## 2023-03-20 DIAGNOSIS — Z96653 Presence of artificial knee joint, bilateral: Secondary | ICD-10-CM | POA: Diagnosis present

## 2023-03-20 DIAGNOSIS — E041 Nontoxic single thyroid nodule: Secondary | ICD-10-CM | POA: Diagnosis not present

## 2023-03-20 DIAGNOSIS — I130 Hypertensive heart and chronic kidney disease with heart failure and stage 1 through stage 4 chronic kidney disease, or unspecified chronic kidney disease: Secondary | ICD-10-CM | POA: Diagnosis not present

## 2023-03-20 DIAGNOSIS — M79604 Pain in right leg: Secondary | ICD-10-CM | POA: Diagnosis not present

## 2023-03-20 DIAGNOSIS — Z8249 Family history of ischemic heart disease and other diseases of the circulatory system: Secondary | ICD-10-CM | POA: Diagnosis not present

## 2023-03-20 DIAGNOSIS — M8580 Other specified disorders of bone density and structure, unspecified site: Secondary | ICD-10-CM | POA: Diagnosis present

## 2023-03-20 DIAGNOSIS — I10 Essential (primary) hypertension: Secondary | ICD-10-CM | POA: Diagnosis present

## 2023-03-20 DIAGNOSIS — M1990 Unspecified osteoarthritis, unspecified site: Secondary | ICD-10-CM | POA: Diagnosis present

## 2023-03-20 DIAGNOSIS — Z825 Family history of asthma and other chronic lower respiratory diseases: Secondary | ICD-10-CM

## 2023-03-20 DIAGNOSIS — J9811 Atelectasis: Secondary | ICD-10-CM | POA: Diagnosis present

## 2023-03-20 DIAGNOSIS — J45909 Unspecified asthma, uncomplicated: Secondary | ICD-10-CM | POA: Diagnosis present

## 2023-03-20 DIAGNOSIS — I358 Other nonrheumatic aortic valve disorders: Secondary | ICD-10-CM | POA: Diagnosis present

## 2023-03-20 DIAGNOSIS — Z888 Allergy status to other drugs, medicaments and biological substances status: Secondary | ICD-10-CM

## 2023-03-20 DIAGNOSIS — K7689 Other specified diseases of liver: Secondary | ICD-10-CM | POA: Diagnosis not present

## 2023-03-20 DIAGNOSIS — M79605 Pain in left leg: Secondary | ICD-10-CM | POA: Diagnosis not present

## 2023-03-20 DIAGNOSIS — N183 Type 2 diabetes mellitus with diabetic chronic kidney disease: Secondary | ICD-10-CM | POA: Diagnosis present

## 2023-03-20 DIAGNOSIS — Z79899 Other long term (current) drug therapy: Secondary | ICD-10-CM

## 2023-03-20 DIAGNOSIS — N184 Chronic kidney disease, stage 4 (severe): Secondary | ICD-10-CM | POA: Diagnosis present

## 2023-03-20 DIAGNOSIS — J189 Pneumonia, unspecified organism: Secondary | ICD-10-CM | POA: Diagnosis not present

## 2023-03-20 DIAGNOSIS — D631 Anemia in chronic kidney disease: Secondary | ICD-10-CM | POA: Diagnosis present

## 2023-03-20 DIAGNOSIS — I517 Cardiomegaly: Secondary | ICD-10-CM | POA: Diagnosis not present

## 2023-03-20 DIAGNOSIS — E119 Type 2 diabetes mellitus without complications: Secondary | ICD-10-CM

## 2023-03-20 DIAGNOSIS — J9601 Acute respiratory failure with hypoxia: Principal | ICD-10-CM | POA: Diagnosis present

## 2023-03-20 DIAGNOSIS — J440 Chronic obstructive pulmonary disease with acute lower respiratory infection: Secondary | ICD-10-CM | POA: Diagnosis present

## 2023-03-20 DIAGNOSIS — R7989 Other specified abnormal findings of blood chemistry: Secondary | ICD-10-CM | POA: Diagnosis not present

## 2023-03-20 DIAGNOSIS — R600 Localized edema: Secondary | ICD-10-CM | POA: Diagnosis not present

## 2023-03-20 DIAGNOSIS — R0989 Other specified symptoms and signs involving the circulatory and respiratory systems: Secondary | ICD-10-CM | POA: Diagnosis not present

## 2023-03-20 DIAGNOSIS — N281 Cyst of kidney, acquired: Secondary | ICD-10-CM | POA: Diagnosis not present

## 2023-03-20 DIAGNOSIS — E1122 Type 2 diabetes mellitus with diabetic chronic kidney disease: Secondary | ICD-10-CM | POA: Diagnosis present

## 2023-03-20 DIAGNOSIS — E1169 Type 2 diabetes mellitus with other specified complication: Secondary | ICD-10-CM

## 2023-03-20 DIAGNOSIS — Z86711 Personal history of pulmonary embolism: Secondary | ICD-10-CM

## 2023-03-20 DIAGNOSIS — Z1152 Encounter for screening for COVID-19: Secondary | ICD-10-CM | POA: Diagnosis not present

## 2023-03-20 DIAGNOSIS — L89153 Pressure ulcer of sacral region, stage 3: Secondary | ICD-10-CM | POA: Diagnosis not present

## 2023-03-20 DIAGNOSIS — R918 Other nonspecific abnormal finding of lung field: Secondary | ICD-10-CM | POA: Diagnosis not present

## 2023-03-20 DIAGNOSIS — Z9049 Acquired absence of other specified parts of digestive tract: Secondary | ICD-10-CM | POA: Diagnosis not present

## 2023-03-20 DIAGNOSIS — Z7951 Long term (current) use of inhaled steroids: Secondary | ICD-10-CM

## 2023-03-20 DIAGNOSIS — J168 Pneumonia due to other specified infectious organisms: Secondary | ICD-10-CM | POA: Diagnosis not present

## 2023-03-20 DIAGNOSIS — J449 Chronic obstructive pulmonary disease, unspecified: Secondary | ICD-10-CM | POA: Diagnosis present

## 2023-03-20 LAB — CBC WITH DIFFERENTIAL/PLATELET
Abs Immature Granulocytes: 0.06 10*3/uL (ref 0.00–0.07)
Basophils Absolute: 0.1 10*3/uL (ref 0.0–0.1)
Basophils Relative: 1 %
Eosinophils Absolute: 0.1 10*3/uL (ref 0.0–0.5)
Eosinophils Relative: 1 %
HCT: 34.6 % — ABNORMAL LOW (ref 36.0–46.0)
Hemoglobin: 11 g/dL — ABNORMAL LOW (ref 12.0–15.0)
Immature Granulocytes: 1 %
Lymphocytes Relative: 15 %
Lymphs Abs: 1.6 10*3/uL (ref 0.7–4.0)
MCH: 31.3 pg (ref 26.0–34.0)
MCHC: 31.8 g/dL (ref 30.0–36.0)
MCV: 98.3 fL (ref 80.0–100.0)
Monocytes Absolute: 0.8 10*3/uL (ref 0.1–1.0)
Monocytes Relative: 8 %
Neutro Abs: 8.2 10*3/uL — ABNORMAL HIGH (ref 1.7–7.7)
Neutrophils Relative %: 74 %
Platelets: 271 10*3/uL (ref 150–400)
RBC: 3.52 MIL/uL — ABNORMAL LOW (ref 3.87–5.11)
RDW: 15.4 % (ref 11.5–15.5)
WBC: 10.8 10*3/uL — ABNORMAL HIGH (ref 4.0–10.5)
nRBC: 0 % (ref 0.0–0.2)

## 2023-03-20 LAB — RESP PANEL BY RT-PCR (RSV, FLU A&B, COVID)  RVPGX2
Influenza A by PCR: NEGATIVE
Influenza B by PCR: NEGATIVE
Resp Syncytial Virus by PCR: NEGATIVE
SARS Coronavirus 2 by RT PCR: NEGATIVE

## 2023-03-20 LAB — COMPREHENSIVE METABOLIC PANEL
ALT: 122 U/L — ABNORMAL HIGH (ref 0–44)
AST: 63 U/L — ABNORMAL HIGH (ref 15–41)
Albumin: 2.9 g/dL — ABNORMAL LOW (ref 3.5–5.0)
Alkaline Phosphatase: 312 U/L — ABNORMAL HIGH (ref 38–126)
Anion gap: 11 (ref 5–15)
BUN: 34 mg/dL — ABNORMAL HIGH (ref 8–23)
CO2: 20 mmol/L — ABNORMAL LOW (ref 22–32)
Calcium: 8.2 mg/dL — ABNORMAL LOW (ref 8.9–10.3)
Chloride: 102 mmol/L (ref 98–111)
Creatinine, Ser: 1.94 mg/dL — ABNORMAL HIGH (ref 0.44–1.00)
GFR, Estimated: 24 mL/min — ABNORMAL LOW (ref >60)
Glucose, Bld: 188 mg/dL — ABNORMAL HIGH (ref 70–99)
Potassium: 4.1 mmol/L (ref 3.5–5.1)
Sodium: 133 mmol/L — ABNORMAL LOW (ref 135–145)
Total Bilirubin: 1.1 mg/dL (ref 0.3–1.2)
Total Protein: 5.9 g/dL — ABNORMAL LOW (ref 6.5–8.1)

## 2023-03-20 LAB — BRAIN NATRIURETIC PEPTIDE: B Natriuretic Peptide: 224.5 pg/mL — ABNORMAL HIGH (ref 0.0–100.0)

## 2023-03-20 MED ORDER — TRAZODONE HCL 100 MG PO TABS
100.0000 mg | ORAL_TABLET | Freq: Every day | ORAL | Status: DC
  Administered 2023-03-21 – 2023-03-31 (×12): 100 mg via ORAL
  Filled 2023-03-20 (×12): qty 1

## 2023-03-20 MED ORDER — UMECLIDINIUM BROMIDE 62.5 MCG/ACT IN AEPB
1.0000 | INHALATION_SPRAY | Freq: Every day | RESPIRATORY_TRACT | Status: DC
  Administered 2023-03-21 – 2023-04-01 (×12): 1 via RESPIRATORY_TRACT
  Filled 2023-03-20 (×2): qty 7

## 2023-03-20 MED ORDER — FUROSEMIDE 10 MG/ML IJ SOLN
20.0000 mg | Freq: Once | INTRAMUSCULAR | Status: AC
  Administered 2023-03-20: 20 mg via INTRAVENOUS
  Filled 2023-03-20: qty 4

## 2023-03-20 MED ORDER — APIXABAN 2.5 MG PO TABS
2.5000 mg | ORAL_TABLET | Freq: Two times a day (BID) | ORAL | Status: DC
  Administered 2023-03-21 – 2023-04-01 (×24): 2.5 mg via ORAL
  Filled 2023-03-20 (×25): qty 1

## 2023-03-20 MED ORDER — METOPROLOL SUCCINATE ER 25 MG PO TB24
25.0000 mg | ORAL_TABLET | Freq: Every day | ORAL | Status: DC
  Administered 2023-03-21 – 2023-04-01 (×12): 25 mg via ORAL
  Filled 2023-03-20 (×12): qty 1

## 2023-03-20 MED ORDER — SODIUM CHLORIDE 0.9 % IV SOLN
500.0000 mg | INTRAVENOUS | Status: DC
  Administered 2023-03-20 – 2023-03-22 (×3): 500 mg via INTRAVENOUS
  Filled 2023-03-20 (×3): qty 5

## 2023-03-20 MED ORDER — FLUTICASONE FUROATE-VILANTEROL 100-25 MCG/ACT IN AEPB
1.0000 | INHALATION_SPRAY | Freq: Every day | RESPIRATORY_TRACT | Status: DC
  Administered 2023-03-21 – 2023-04-01 (×12): 1 via RESPIRATORY_TRACT
  Filled 2023-03-20: qty 28

## 2023-03-20 MED ORDER — SODIUM CHLORIDE 0.9 % IV SOLN
2.0000 g | INTRAVENOUS | Status: AC
  Administered 2023-03-20 – 2023-03-24 (×5): 2 g via INTRAVENOUS
  Filled 2023-03-20 (×5): qty 20

## 2023-03-20 MED ORDER — DILTIAZEM HCL ER COATED BEADS 180 MG PO CP24: 360.0000 mg | ORAL_CAPSULE | Freq: Every day | ORAL | Status: DC | PRN

## 2023-03-20 MED ORDER — AMIODARONE HCL 200 MG PO TABS
200.0000 mg | ORAL_TABLET | Freq: Every day | ORAL | Status: DC
  Administered 2023-03-21 – 2023-04-01 (×12): 200 mg via ORAL
  Filled 2023-03-20 (×12): qty 1

## 2023-03-20 NOTE — H&P (Signed)
History and Physical    Patient: Leslie Parker ZOX:[id number] DOB: 06/20/1933 DOA: 03/20/2023 DOS: the patient was seen and examined on 03/21/2023 PCP: [name], MD  Patient coming from: Home   Chief Complaint:  Chief Complaint  Patient presents with   Post-op Problem    HPI: RHAELYN Parker is a 89 y.o. female with medical history significant for congestive heart failure, hypertension, asthma, A-fib presenting with shortness of breath.  Patient recently had hip surgery and was discharged from rehab she is not on home oxygen and she is Coming in with hypoxia with O2 sats of 88% on room air patient's cough is clear what no reports of fevers or chills.  Patient states she lives alone but has been feeling weak.  No reports of chest pain palpitations.  Patient does report nausea but no vomiting.  Patient has been having swelling in both her legs. In the emergency room she is alert awake oriented afebrile is dyspneic and has auditory rales at bedside that we can hear without the stethoscope.  On oxygen currently at 2 L nasal cannula and is stable not in distress. Blood work in the emergency room is abnormal showing hyponatremia of 133, CKD stage IV with a creatinine at 1.94 and EGFR of 24.  LFTs are abnormal with ALT of 122 AST of 63 alk phos of 312, BNP of 224.5. CBC abnormal with a white count of 10.8 hemoglobin of 11 normal neutrophil count platelet counts are 271.  Respiratory panel is negative for influenza RSV and COVID, in the emergency room patient received 20 mg of Lasix IV. Chest x-ray done today shows retrocardiac strandy opacities like atelectasis.  Intake/Output Summary (Last 24 hours) at 03/21/2023 0157 Last data filed at 03/21/2023 0042 Gross per 24 hour  Intake 350 ml  Output --  Net 350 ml    Review of Systems: Review of Systems  Respiratory:  Positive for cough, sputum production, shortness of breath and wheezing.   Cardiovascular:  Positive for leg swelling.   Neurological:  Positive for weakness.  All other systems reviewed and are negative.   Past Medical History:  Diagnosis Date   Asthma    Atrial fibrillation with RVR (HCC)    Cancer (HCC)    Basal Cell   Diabetes mellitus without complication (HCC)    Heart murmur    Hypertension    Impetigo    Osteoarthritis    Osteopenia    Vomiting    can not due to surgery   Wears dentures    full upper and lower   Past Surgical History:  Procedure Laterality Date   ABDOMINAL HYSTERECTOMY     BACK SURGERY     BLADDER SURGERY     mesh   CARPAL TUNNEL RELEASE Bilateral    CATARACT EXTRACTION Right 2017   CATARACT EXTRACTION W/PHACO Left 02/06/2016   Procedure: CATARACT EXTRACTION PHACO AND INTRAOCULAR LENS PLACEMENT (IOC) left eye;  Surgeon: Sherald Hess, MD;  Location: [facility];  Service: Ophthalmology;  Laterality: Left;  DIABETIC LEFT Cannot arrive before 9:30   CERVICAL DISC SURGERY     CHOLECYSTECTOMY     EYE SURGERY Bilateral    Cataract Extraction with IOL   FEMUR IM NAIL Left 02/04/2015   Procedure: INTRAMEDULLARY (IM) NAIL FEMORAL;  Surgeon: Juanell Fairly, MD;  Location: [facility];  Service: Orthopedics;  Laterality: Left;   HARDWARE REMOVAL Left 01/17/2017   Procedure: HARDWARE REMOVAL;  Surgeon: Juanell Fairly, MD;  Location: [facility]  ORS;  Service: Orthopedics;  Laterality: Left;   HERNIA REPAIR  2014   esophageal and gastric mesh. patient unable to throw up d/t mesh   HIP ARTHROPLASTY Left 01/26/2017   Procedure: ARTHROPLASTY BIPOLAR HIP (HEMIARTHROPLASTY) removal hardware left hip;  Surgeon: Deeann Saint, MD;  Location: [facility];  Service: Orthopedics;  Laterality: Left;   INTRAMEDULLARY (IM) NAIL INTERTROCHANTERIC Right 02/13/2023   Procedure: INTRAMEDULLARY (IM) NAIL INTERTROCHANTERIC;  Surgeon: Christena Flake, MD;  Location: [facility];  Service: Orthopedics;  Laterality: Right;   JOINT REPLACEMENT Bilateral    knees   PACEMAKER IMPLANT N/A 11/15/2022    Procedure: PACEMAKER IMPLANT;  Surgeon: Marcina Millard, MD;  Location: [facility];  Service: Cardiovascular;  Laterality: N/A;   PACEMAKER IMPLANT N/A 11/28/2022   Procedure: PACEMAKER IMPLANT;  Surgeon: Marcina Millard, MD;  Location: [facility];  Service: Cardiovascular;  Laterality: N/A;  Lead reposition   REPLACEMENT TOTAL KNEE BILATERAL Bilateral [id number]   SHOULDER ARTHROSCOPY WITH OPEN ROTATOR CUFF REPAIR AND DISTAL CLAVICLE ACROMINECTOMY Left 10/25/2016   Procedure: SHOULDER ARTHROSCOPY WITH OPEN ROTATOR CUFF REPAIR AND DISTAL CLAVICLE ACROMINECTOMY;  Surgeon: Juanell Fairly, MD;  Location: [facility];  Service: Orthopedics;  Laterality: Left;   THYROID SURGERY     goiter removed   TOTAL HIP REVISION Left 12/02/2017   Procedure: TOTAL HIP REVISION;  Surgeon: Lyndle Herrlich, MD;  Location: [facility];  Service: Orthopedics;  Laterality: Left;   TOTAL SHOULDER REPLACEMENT Right 2012   Social History:   reports that she has never smoked. She has never used smokeless tobacco. She reports that she does not drink alcohol and does not use drugs.  Allergies  Allergen Reactions   Baclofen Other (See Comments)    Elevated Cr   Simvastatin Other (See Comments)    Pt denies  Elevated LFTs with simva 40mg , stopped and resolved (see MD note 11/10/13)    Family History  Problem Relation Age of Onset   Heart failure Mother    Hypertension Mother    Emphysema Father     Prior to Admission medications   Medication Sig Start Date End Date Taking? Authorizing Provider  albuterol (VENTOLIN HFA) 108 (90 Base) MCG/ACT inhaler Inhale 2 puffs into the lungs every 6 (six) hours as needed. 08/04/19   [provider]  amiodarone (PACERONE) 200 MG tablet Take 200 mg by mouth daily.    [provider]  apixaban (ELIQUIS) 2.5 MG TABS tablet Take 1 tablet (2.5 mg total) by mouth 2 (two) times daily. 06/07/22   Tresa Moore, MD  ascorbic acid (VITAMIN C)  250 MG CHEW Chew 250 mg by mouth daily.    [provider]  bisacodyl (DULCOLAX) 10 MG suppository Place 1 suppository (10 mg total) rectally daily as needed for moderate constipation. 02/15/23   Sunnie Nielsen, DO  Cholecalciferol (VITAMIN D-1000 MAX ST) 25 MCG (1000 UT) tablet Take 1,000 Units by mouth daily.    [provider]  colestipol (COLESTID) 1 g tablet Take 1 tablet by mouth 2 (two) times daily. 06/01/22   [provider]  Cyanocobalamin (B-12) 2500 MCG TABS Take 2,500 mcg by mouth daily.    [provider]  diltiazem (CARDIZEM CD) 360 MG 24 hr capsule Take 360 mg by mouth daily. 11/23/22   [provider]  docusate sodium (COLACE) 100 MG capsule Take 1 capsule (100 mg total) by mouth 2 (two) times daily. 02/15/23   Sunnie Nielsen, DO  feeding supplement (  ENSURE ENLIVE / ENSURE PLUS) LIQD Take 237 mLs by mouth 2 (two) times daily between meals. 02/15/23   Sunnie Nielsen, DO  furosemide (LASIX) 20 MG tablet Take 20 mg by mouth daily. 03/06/22   [provider]  glipiZIDE (GLUCOTROL XL) 2.5 MG 24 hr tablet Take 2.5 mg by mouth daily with breakfast. 03/27/22   [provider]  ipratropium-albuterol (DUONEB) 0.5-2.5 (3) MG/3ML SOLN Inhale 3 mLs into the lungs every 6 (six) hours as needed. 06/01/22   [provider]  Magnesium Oxide 500 MG CAPS Take 500 mg by mouth daily.    [provider]  methocarbamol (ROBAXIN) 500 MG tablet Take 1 tablet (500 mg total) by mouth every 8 (eight) hours as needed for muscle spasms. 02/15/23   Sunnie Nielsen, DO  metoprolol succinate (TOPROL-XL) 25 MG 24 hr tablet Take 1 tablet (25 mg total) by mouth daily. 11/16/22   Tang, Cheryln Manly, PA-C  metoprolol tartrate (LOPRESSOR) 25 MG tablet Take 1 tablet (25 mg total) by mouth 2 (two) times daily. 02/15/23   Sunnie Nielsen, DO  Multiple Vitamin (MULTIVITAMIN WITH MINERALS) TABS tablet Take 1 tablet by mouth daily. One-A-Day  Women's Vitamin    [provider]  omeprazole (PRILOSEC) 20 MG capsule Take 20 mg by mouth every morning. 01/10/23   [provider]  oxyCODONE-acetaminophen (PERCOCET/ROXICET) 5-325 MG tablet Take 1 tablet by mouth every 4 (four) hours as needed for moderate pain. 02/15/23   Anson [place], PA-C  traZODone (DESYREL) 100 MG tablet Take 100 mg by mouth at bedtime.    [provider]  TRELEGY ELLIPTA 100-62.5-25 MCG/ACT AEPB Inhale 1 puff into the lungs daily. 08/03/22   [provider]     Vitals:   03/21/23 0030 03/21/23 0100 03/21/23 0127 03/21/23 0138  BP: (!) 112/56 (!) 109/53    Pulse: (!) 59 60 60 60  Resp: 14 12 14 11   Temp:   97.9 F (36.6 C)   TempSrc:   Oral   SpO2: 96% 96% 96% 98%  Weight:      Height:       Physical Exam Vitals and nursing note reviewed.  Constitutional:      General: She is in acute distress.     Appearance: She is obese. She is ill-appearing.  HENT:     Head: Normocephalic and atraumatic.     Right Ear: Hearing and external ear normal.     Left Ear: Hearing and external ear normal.     Nose: Nose normal. No nasal deformity.     Mouth/Throat:     Lips: Pink.     Tongue: No lesions.  Eyes:     General: Lids are normal.     Extraocular Movements: Extraocular movements intact.     Pupils: Pupils are equal, round, and reactive to light.  Cardiovascular:     Rate and Rhythm: Normal rate and regular rhythm.     Heart sounds: Normal heart sounds.  Pulmonary:     Effort: Respiratory distress present.     Breath sounds: Rales present.  Abdominal:     General: Bowel sounds are normal. There is no distension.     Palpations: Abdomen is soft. There is no mass.     Tenderness: There is no abdominal tenderness.  Musculoskeletal:     Right lower leg: Edema present.     Left lower leg: Edema present.  Skin:    General: Skin is warm.  Neurological:  General: No focal deficit present.     Mental Status: She is  alert and oriented to person, place, and time.     Cranial Nerves: Cranial nerves 2-12 are intact.  Psychiatric:        Attention and Perception: Attention normal.        Mood and Affect: Mood normal.        Speech: Speech normal.        Behavior: Behavior normal. Behavior is cooperative.      Labs on Admission: I have personally reviewed following labs and imaging studies  CBC: Recent Labs  Lab 03/20/23 1732  WBC 10.8*  NEUTROABS 8.2*  HGB 11.0*  HCT 34.6*  MCV 98.3  PLT 271   Basic Metabolic Panel: Recent Labs  Lab 03/20/23 1732  NA 133*  K 4.1  CL 102  CO2 20*  GLUCOSE 188*  BUN 34*  CREATININE 1.94*  CALCIUM 8.2*  MG 1.6*   GFR: Estimated Creatinine Clearance: 22.8 mL/min (A) (by C-G formula based on SCr of 1.94 mg/dL (H)). Liver Function Tests: Recent Labs  Lab 03/20/23 1732  AST 63*  ALT 122*  ALKPHOS 312*  BILITOT 1.1  PROT 5.9*  ALBUMIN 2.9*   No results for input(s): "LIPASE", "AMYLASE" in the last 168 hours. No results for input(s): "AMMONIA" in the last 168 hours. Coagulation Profile: No results for input(s): "INR", "PROTIME" in the last 168 hours. Cardiac Enzymes: No results for input(s): "CKTOTAL", "CKMB", "CKMBINDEX", "TROPONINI" in the last 168 hours. BNP (last 3 results) No results for input(s): "PROBNP" in the last 8760 hours. HbA1C: No results for input(s): "HGBA1C" in the last 72 hours. CBG: No results for input(s): "GLUCAP" in the last 168 hours. Lipid Profile: No results for input(s): "CHOL", "HDL", "LDLCALC", "TRIG", "CHOLHDL", "LDLDIRECT" in the last 72 hours. Thyroid Function Tests: No results for input(s): "TSH", "T4TOTAL", "FREET4", "T3FREE", "THYROIDAB" in the last 72 hours. Anemia Panel: No results for input(s): "VITAMINB12", "FOLATE", "FERRITIN", "TIBC", "IRON", "RETICCTPCT" in the last 72 hours. Urinalysis  Medications  cefTRIAXone (ROCEPHIN) 2 g in sodium chloride 0.9 % 100 mL IVPB (0 g Intravenous Stopped 03/20/23  2325)  azithromycin (ZITHROMAX) 500 mg in sodium chloride 0.9 % 250 mL IVPB (0 mg Intravenous Stopped 03/21/23 0042)  amiodarone (PACERONE) tablet 200 mg (has no administration in time range)  apixaban (ELIQUIS) tablet 2.5 mg (has no administration in time range)  diltiazem (CARDIZEM CD) 24 hr capsule 360 mg (has no administration in time range)  metoprolol succinate (TOPROL-XL) 24 hr tablet 25 mg (has no administration in time range)  fluticasone furoate-vilanterol (BREO ELLIPTA) 100-25 MCG/ACT 1 puff (has no administration in time range)    And  umeclidinium bromide (INCRUSE ELLIPTA) 62.5 MCG/ACT 1 puff (has no administration in time range)  traZODone (DESYREL) tablet 100 mg (100 mg Oral Given 03/21/23 0028)  insulin aspart (novoLOG) injection 0-15 Units (has no administration in time range)  sodium chloride flush (NS) 0.9 % injection 3 mL (3 mLs Intravenous Given 03/21/23 0028)  acetaminophen (TYLENOL) tablet 650 mg (has no administration in time range)    Or  acetaminophen (TYLENOL) suppository 650 mg (has no administration in time range)  morphine (PF) 2 MG/ML injection 2 mg (has no administration in time range)  sodium chloride flush (NS) 0.9 % injection 3 mL (3 mLs Intravenous Given 03/21/23 0029)  sodium chloride flush (NS) 0.9 % injection 3 mL (has no administration in time range)  0.9 %  sodium chloride infusion (has  no administration in time range)  levalbuterol (XOPENEX) nebulizer solution 0.63 mg (has no administration in time range)  furosemide (LASIX) injection 20 mg (has no administration in time range)  magnesium sulfate IVPB 2 g 50 mL (has no administration in time range)  furosemide (LASIX) injection 20 mg (20 mg Intravenous Given 03/20/23 2212)    Radiological Exams on Admission: US ABDOMEN LIMITED RUQ (LIVER/GB)  Result Date: 03/20/2023 CLINICAL DATA:  Elevated LFTs EXAM: ULTRASOUND ABDOMEN LIMITED RIGHT UPPER QUADRANT COMPARISON:  None Available. FINDINGS: Gallbladder:  Surgically removed Common bile duct: Diameter: 16.7 mm. This is consistent with the post cholecystectomy state. Mild intrahepatic ductal dilatation is noted as well. Liver: Mild nodularity to the liver is noted consistent with underlying cirrhosis. No focal mass is seen. Portal vein is patent on color Doppler imaging with normal direction of blood flow towards the liver. Other: Note is made of right renal cysts. IMPRESSION: Status post cholecystectomy with compensatory enlargement of the common bile duct. Simple cysts are noted within the right kidney. No follow-up is recommended. Electronically Signed   By: Alcide Clever M.D.   On: 03/20/2023 20:42   DG Chest Port 1 View  Result Date: 03/20/2023 CLINICAL DATA:  Cough and hypoxia EXAM: PORTABLE CHEST 1 VIEW COMPARISON:  Chest x-ray 02/13/2023 FINDINGS: Heart is enlarged, unchanged. There are minimal retrocardiac strandy opacities. Costophrenic angles are clear. No pneumothorax. There are atherosclerotic calcifications of the aorta. There surgical changes in both shoulders. IMPRESSION: 1. Minimal retrocardiac strandy opacities, likely atelectasis. 2. Cardiomegaly. Electronically Signed   By: Darliss Cheney M.D.   On: 03/20/2023 19:49     Data Reviewed: Relevant notes from primary care and specialist visits, past discharge summaries as available in EHR, including Care Everywhere. Prior diagnostic testing as pertinent to current admission diagnoses Updated medications and problem lists for reconciliation ED course, including vitals, labs, imaging, treatment and response to treatment Triage notes, nursing and pharmacy notes and ED provider's notes Notable results as noted in HPI  Assessment and Plan: * Shortness of breath Pt coming with SOB and o2 need. SpO2: 98 % O2 Flow Rate (L/min): 2 L/min Suspect 2/2 to CHF and less likely PNA, we will check procalcitonin and follow. Suspect more so likely to her CHF. Pt is hypoxic and meets respiratory failure  diagnosis. She is on age adjusted dose of Eliquis  due to her h/o PE.  Chest xray shows atelectasis , we will obtain CT chest [place] due to CKD.     Acute on chronic diastolic CHF (congestive heart failure) (HCC) Last echo in 01/2023 shows: 1. Left ventricular ejection fraction, by estimation, is 60 to 65%. The  left ventricle has normal function. The left ventricle has no regional  wall motion abnormalities. There is moderate concentric left ventricular  hypertrophy. Left ventricular  diastolic parameters are consistent with Grade I diastolic dysfunction  (impaired relaxation).   2. Right ventricular systolic function is normal. The right ventricular  size is normal.   3. The mitral valve is grossly normal. Mild mitral valve regurgitation.   4. The aortic valve is calcified. Aortic valve regurgitation is trivial.  Aortic valve sclerosis/calcification is present, without any evidence of  aortic stenosis.  Pt got lasix  20 mg iv in ED and is stable on 2 L [place].  Cardiology is Dr.Callwood.   COPD (chronic obstructive pulmonary disease) (HCC) Stable. Cont PRN albuterol and Duoneb treatment./ Supplemental oxygen as needed and pulse oximetry monitoring.    Anemia  Latest Ref Rng & Units 03/20/2023    5:32 PM 02/15/2023    4:10 AM 02/14/2023    5:39 AM  CBC  WBC 4.0 - 10.5 K/uL 10.8  16.8  12.2   Hemoglobin 12.0 - 15.0 g/dL 81.1  7.9  8.5   Hematocrit 36.0 - 46.0 % 34.6  22.9  25.0   Platelets 150 - 400 K/uL 271  150  162   Hemoglobin is stable at 11, mildly elevated white count could be from infection or stress otherwise differentials are normal.  We will type and screen and follow transfuse as deemed appropriate.   Abnormal LFTs Pt had [place] today showing: IMPRESSION: Status post cholecystectomy with compensatory enlargement of the common bile duct.Simple cysts are noted within the right kidney. No follow-up is recommended.  Suspect 2/2 to passive hepatic congestion.    Paroxysmal  atrial fibrillation (HCC) Continue eliquis/ amiodarone / diltiazem and metoprolol.    CKD stage 3 due to type 2 diabetes mellitus (HCC) Lab Results  Component Value Date   CREATININE 1.94 (H) 03/20/2023   CREATININE 2.19 (H) 02/14/2023   CREATININE 1.74 (H) 02/13/2023  Stable. Avoid contrast.  Renally dose meds.     Diabetes mellitus without complication (HCC) Glycemic protocol.  Hold glipizide to prevent hypoglycemia.   Hypomagnesemia Replaced in ed.  Asthma, chronic Stable mild wheezing. Cont PRN albuterol.     Hypertension Vitals:   03/20/23 1729 03/20/23 1837 03/20/23 1900 03/20/23 2300  BP: (!) 121/58 (!) 115/51 (!) 127/59 (!) 120/58  Continue diltiazem and metoprolol.     DVT prophylaxis:  Eliquis.  Consults:  None   Advance Care Planning:    Code Status: Full Code   Family Communication:  None   Disposition Plan:  Home   Severity of Illness: The appropriate patient status for this patient is OBSERVATION. Observation status is judged to be reasonable and necessary in order to provide the required intensity of service to ensure the patient's safety. The patient's presenting symptoms, physical exam findings, and initial radiographic and laboratory data in the context of their medical condition is felt to place them at decreased risk for further clinical deterioration. Furthermore, it is anticipated that the patient will be medically stable for discharge from the hospital within 2 midnights of admission.   Author: Gertha Calkin, MD 03/21/2023 1:57 AM  For on call review [email or web address].

## 2023-03-20 NOTE — Assessment & Plan Note (Addendum)
Last echo in 01/2023 shows: 1. Left ventricular ejection fraction, by estimation, is 60 to 65%. The  left ventricle has normal function. The left ventricle has no regional  wall motion abnormalities. There is moderate concentric left ventricular  hypertrophy. Left ventricular  diastolic parameters are consistent with Grade I diastolic dysfunction  (impaired relaxation).   2. Right ventricular systolic function is normal. The right ventricular  size is normal.   3. The mitral valve is grossly normal. Mild mitral valve regurgitation.   4. The aortic valve is calcified. Aortic valve regurgitation is trivial.  Aortic valve sclerosis/calcification is present, without any evidence of  aortic stenosis.  Pt got lasix  20 mg iv in ED and is stable on 2 L [place].  Cardiology is Dr.Callwood.

## 2023-03-20 NOTE — Assessment & Plan Note (Signed)
Glycemic protocol.  Hold glipizide to prevent hypoglycemia.

## 2023-03-20 NOTE — Assessment & Plan Note (Signed)
Stable. Cont PRN albuterol and Duoneb treatment./ Supplemental oxygen as needed and pulse oximetry monitoring.

## 2023-03-20 NOTE — Assessment & Plan Note (Signed)
Stable mild wheezing. Cont PRN albuterol.

## 2023-03-20 NOTE — Assessment & Plan Note (Signed)
Suspect more so likely to her CHF. Pt is hypoxic and meets respiratory failure diagnosis. She is on age adjusted dose of Eliquis  due to her h/o PE.

## 2023-03-20 NOTE — Assessment & Plan Note (Addendum)
Vitals:   03/20/23 1729 03/20/23 1837 03/20/23 1900 03/20/23 2300  BP: (!) 121/58 (!) 115/51 (!) 127/59 (!) 120/58  Continue diltiazem and metoprolol.

## 2023-03-20 NOTE — ED Triage Notes (Signed)
Pt presents to ED with multiple complaints due to post op surgery on R hip this past July. Family states bed sore to buttock area, pt states was there ever since pt went to [facility] for rehab.

## 2023-03-20 NOTE — H&P (Incomplete)
History and Physical    Patient: Leslie Parker ZOX:[id number] DOB: 06/20/1933 DOA: 03/20/2023 DOS: the patient was seen and examined on 03/21/2023 PCP: [name], MD  Patient coming from: Home   Chief Complaint:  Chief Complaint  Patient presents with   Post-op Problem    HPI: Leslie Parker is a 89 y.o. female with medical history significant for congestive heart failure, hypertension, asthma, A-fib presenting with shortness of breath.  Patient recently had hip surgery and was discharged from rehab she is not on home oxygen and she is Coming in with hypoxia with O2 sats of 88% on room air patient's cough is clear what no reports of fevers or chills.  Patient states she lives alone but has been feeling weak.  No reports of chest pain palpitations.  Patient does report nausea but no vomiting.  Patient has been having swelling in both her legs. In the emergency room she is alert awake oriented afebrile is dyspneic and has auditory rales at bedside that we can hear without the stethoscope.  On oxygen currently at 2 L nasal cannula and is stable not in distress. Blood work in the emergency room is abnormal showing hyponatremia of 133, CKD stage IV with a creatinine at 1.94 and EGFR of 24.  LFTs are abnormal with ALT of 122 AST of 63 alk phos of 312, BNP of 224.5. CBC abnormal with a white count of 10.8 hemoglobin of 11 normal neutrophil count platelet counts are 271.  Respiratory panel is negative for influenza RSV and COVID, in the emergency room patient received 20 mg of Lasix IV. Chest x-ray done today shows retrocardiac strandy opacities like atelectasis.  Intake/Output Summary (Last 24 hours) at 03/21/2023 0157 Last data filed at 03/21/2023 0042 Gross per 24 hour  Intake 350 ml  Output --  Net 350 ml    Review of Systems: Review of Systems  Respiratory:  Positive for cough, sputum production, shortness of breath and wheezing.   Cardiovascular:  Positive for leg swelling.   Neurological:  Positive for weakness.  All other systems reviewed and are negative.   Past Medical History:  Diagnosis Date   Asthma    Atrial fibrillation with RVR (HCC)    Cancer (HCC)    Basal Cell   Diabetes mellitus without complication (HCC)    Heart murmur    Hypertension    Impetigo    Osteoarthritis    Osteopenia    Vomiting    can not due to surgery   Wears dentures    full upper and lower   Past Surgical History:  Procedure Laterality Date   ABDOMINAL HYSTERECTOMY     BACK SURGERY     BLADDER SURGERY     mesh   CARPAL TUNNEL RELEASE Bilateral    CATARACT EXTRACTION Right 2017   CATARACT EXTRACTION W/PHACO Left 02/06/2016   Procedure: CATARACT EXTRACTION PHACO AND INTRAOCULAR LENS PLACEMENT (IOC) left eye;  Surgeon: Sherald Hess, MD;  Location: [facility];  Service: Ophthalmology;  Laterality: Left;  DIABETIC LEFT Cannot arrive before 9:30   CERVICAL DISC SURGERY     CHOLECYSTECTOMY     EYE SURGERY Bilateral    Cataract Extraction with IOL   FEMUR IM NAIL Left 02/04/2015   Procedure: INTRAMEDULLARY (IM) NAIL FEMORAL;  Surgeon: Juanell Fairly, MD;  Location: [facility];  Service: Orthopedics;  Laterality: Left;   HARDWARE REMOVAL Left 01/17/2017   Procedure: HARDWARE REMOVAL;  Surgeon: Juanell Fairly, MD;  Location: [facility]  ORS;  Service: Orthopedics;  Laterality: Left;   HERNIA REPAIR  2014   esophageal and gastric mesh. patient unable to throw up d/t mesh   HIP ARTHROPLASTY Left 01/26/2017   Procedure: ARTHROPLASTY BIPOLAR HIP (HEMIARTHROPLASTY) removal hardware left hip;  Surgeon: Deeann Saint, MD;  Location: [facility];  Service: Orthopedics;  Laterality: Left;   INTRAMEDULLARY (IM) NAIL INTERTROCHANTERIC Right 02/13/2023   Procedure: INTRAMEDULLARY (IM) NAIL INTERTROCHANTERIC;  Surgeon: Christena Flake, MD;  Location: [facility];  Service: Orthopedics;  Laterality: Right;   JOINT REPLACEMENT Bilateral    knees   PACEMAKER IMPLANT N/A 11/15/2022    Procedure: PACEMAKER IMPLANT;  Surgeon: Marcina Millard, MD;  Location: [facility];  Service: Cardiovascular;  Laterality: N/A;   PACEMAKER IMPLANT N/A 11/28/2022   Procedure: PACEMAKER IMPLANT;  Surgeon: Marcina Millard, MD;  Location: [facility];  Service: Cardiovascular;  Laterality: N/A;  Lead reposition   REPLACEMENT TOTAL KNEE BILATERAL Bilateral [id number]   SHOULDER ARTHROSCOPY WITH OPEN ROTATOR CUFF REPAIR AND DISTAL CLAVICLE ACROMINECTOMY Left 10/25/2016   Procedure: SHOULDER ARTHROSCOPY WITH OPEN ROTATOR CUFF REPAIR AND DISTAL CLAVICLE ACROMINECTOMY;  Surgeon: Juanell Fairly, MD;  Location: [facility];  Service: Orthopedics;  Laterality: Left;   THYROID SURGERY     goiter removed   TOTAL HIP REVISION Left 12/02/2017   Procedure: TOTAL HIP REVISION;  Surgeon: Lyndle Herrlich, MD;  Location: [facility];  Service: Orthopedics;  Laterality: Left;   TOTAL SHOULDER REPLACEMENT Right 2012   Social History:   reports that she has never smoked. She has never used smokeless tobacco. She reports that she does not drink alcohol and does not use drugs.  Allergies  Allergen Reactions   Baclofen Other (See Comments)    Elevated Cr   Simvastatin Other (See Comments)    Pt denies  Elevated LFTs with simva 40mg , stopped and resolved (see MD note 11/10/13)    Family History  Problem Relation Age of Onset   Heart failure Mother    Hypertension Mother    Emphysema Father     Prior to Admission medications   Medication Sig Start Date End Date Taking? Authorizing Provider  albuterol (VENTOLIN HFA) 108 (90 Base) MCG/ACT inhaler Inhale 2 puffs into the lungs every 6 (six) hours as needed. 08/04/19   [provider]  amiodarone (PACERONE) 200 MG tablet Take 200 mg by mouth daily.    [provider]  apixaban (ELIQUIS) 2.5 MG TABS tablet Take 1 tablet (2.5 mg total) by mouth 2 (two) times daily. 06/07/22   Tresa Moore, MD  ascorbic acid (VITAMIN C)  250 MG CHEW Chew 250 mg by mouth daily.    [provider]  bisacodyl (DULCOLAX) 10 MG suppository Place 1 suppository (10 mg total) rectally daily as needed for moderate constipation. 02/15/23   Sunnie Nielsen, DO  Cholecalciferol (VITAMIN D-1000 MAX ST) 25 MCG (1000 UT) tablet Take 1,000 Units by mouth daily.    [provider]  colestipol (COLESTID) 1 g tablet Take 1 tablet by mouth 2 (two) times daily. 06/01/22   [provider]  Cyanocobalamin (B-12) 2500 MCG TABS Take 2,500 mcg by mouth daily.    [provider]  diltiazem (CARDIZEM CD) 360 MG 24 hr capsule Take 360 mg by mouth daily. 11/23/22   [provider]  docusate sodium (COLACE) 100 MG capsule Take 1 capsule (100 mg total) by mouth 2 (two) times daily. 02/15/23   Sunnie Nielsen, DO  feeding supplement (  ENSURE ENLIVE / ENSURE PLUS) LIQD Take 237 mLs by mouth 2 (two) times daily between meals. 02/15/23   Sunnie Nielsen, DO  furosemide (LASIX) 20 MG tablet Take 20 mg by mouth daily. 03/06/22   [provider]  glipiZIDE (GLUCOTROL XL) 2.5 MG 24 hr tablet Take 2.5 mg by mouth daily with breakfast. 03/27/22   [provider]  ipratropium-albuterol (DUONEB) 0.5-2.5 (3) MG/3ML SOLN Inhale 3 mLs into the lungs every 6 (six) hours as needed. 06/01/22   [provider]  Magnesium Oxide 500 MG CAPS Take 500 mg by mouth daily.    [provider]  methocarbamol (ROBAXIN) 500 MG tablet Take 1 tablet (500 mg total) by mouth every 8 (eight) hours as needed for muscle spasms. 02/15/23   Sunnie Nielsen, DO  metoprolol succinate (TOPROL-XL) 25 MG 24 hr tablet Take 1 tablet (25 mg total) by mouth daily. 11/16/22   Tang, Cheryln Manly, PA-C  metoprolol tartrate (LOPRESSOR) 25 MG tablet Take 1 tablet (25 mg total) by mouth 2 (two) times daily. 02/15/23   Sunnie Nielsen, DO  Multiple Vitamin (MULTIVITAMIN WITH MINERALS) TABS tablet Take 1 tablet by mouth daily. One-A-Day  Women's Vitamin    [provider]  omeprazole (PRILOSEC) 20 MG capsule Take 20 mg by mouth every morning. 01/10/23   [provider]  oxyCODONE-acetaminophen (PERCOCET/ROXICET) 5-325 MG tablet Take 1 tablet by mouth every 4 (four) hours as needed for moderate pain. 02/15/23   Anson Oregon, PA-C  traZODone (DESYREL) 100 MG tablet Take 100 mg by mouth at bedtime.    [provider]  TRELEGY ELLIPTA 100-62.5-25 MCG/ACT AEPB Inhale 1 puff into the lungs daily. 08/03/22   [provider]     Vitals:   03/21/23 0030 03/21/23 0100 03/21/23 0127 03/21/23 0138  BP: (!) 112/56 (!) 109/53    Pulse: (!) 59 60 60 60  Resp: 14 12 14 11   Temp:   97.9 F (36.6 C)   TempSrc:   Oral   SpO2: 96% 96% 96% 98%  Weight:      Height:       Physical Exam Vitals and nursing note reviewed.  Constitutional:      General: She is in acute distress.     Appearance: She is obese. She is ill-appearing.  HENT:     Head: Normocephalic and atraumatic.     Right Ear: Hearing and external ear normal.     Left Ear: Hearing and external ear normal.     Nose: Nose normal. No nasal deformity.     Mouth/Throat:     Lips: Pink.     Tongue: No lesions.  Eyes:     General: Lids are normal.     Extraocular Movements: Extraocular movements intact.     Pupils: Pupils are equal, round, and reactive to light.  Cardiovascular:     Rate and Rhythm: Normal rate and regular rhythm.     Heart sounds: Normal heart sounds.  Pulmonary:     Effort: Respiratory distress present.     Breath sounds: Rales present.  Abdominal:     General: Bowel sounds are normal. There is no distension.     Palpations: Abdomen is soft. There is no mass.     Tenderness: There is no abdominal tenderness.  Musculoskeletal:     Right lower leg: Edema present.     Left lower leg: Edema present.  Skin:    General: Skin is warm.  Neurological:  General: No focal deficit present.     Mental Status: She is  alert and oriented to person, place, and time.     Cranial Nerves: Cranial nerves 2-12 are intact.  Psychiatric:        Attention and Perception: Attention normal.        Mood and Affect: Mood normal.        Speech: Speech normal.        Behavior: Behavior normal. Behavior is cooperative.      Labs on Admission: I have personally reviewed following labs and imaging studies  CBC: Recent Labs  Lab 03/20/23 1732  WBC 10.8*  NEUTROABS 8.2*  HGB 11.0*  HCT 34.6*  MCV 98.3  PLT 271   Basic Metabolic Panel: Recent Labs  Lab 03/20/23 1732  NA 133*  K 4.1  CL 102  CO2 20*  GLUCOSE 188*  BUN 34*  CREATININE 1.94*  CALCIUM 8.2*  MG 1.6*   GFR: Estimated Creatinine Clearance: 22.8 mL/min (A) (by C-G formula based on SCr of 1.94 mg/dL (H)). Liver Function Tests: Recent Labs  Lab 03/20/23 1732  AST 63*  ALT 122*  ALKPHOS 312*  BILITOT 1.1  PROT 5.9*  ALBUMIN 2.9*   No results for input(s): "LIPASE", "AMYLASE" in the last 168 hours. No results for input(s): "AMMONIA" in the last 168 hours. Coagulation Profile: No results for input(s): "INR", "PROTIME" in the last 168 hours. Cardiac Enzymes: No results for input(s): "CKTOTAL", "CKMB", "CKMBINDEX", "TROPONINI" in the last 168 hours. BNP (last 3 results) No results for input(s): "PROBNP" in the last 8760 hours. HbA1C: No results for input(s): "HGBA1C" in the last 72 hours. CBG: No results for input(s): "GLUCAP" in the last 168 hours. Lipid Profile: No results for input(s): "CHOL", "HDL", "LDLCALC", "TRIG", "CHOLHDL", "LDLDIRECT" in the last 72 hours. Thyroid Function Tests: No results for input(s): "TSH", "T4TOTAL", "FREET4", "T3FREE", "THYROIDAB" in the last 72 hours. Anemia Panel: No results for input(s): "VITAMINB12", "FOLATE", "FERRITIN", "TIBC", "IRON", "RETICCTPCT" in the last 72 hours. Urinalysis  Medications  cefTRIAXone (ROCEPHIN) 2 g in sodium chloride 0.9 % 100 mL IVPB (0 g Intravenous Stopped 03/20/23  2325)  azithromycin (ZITHROMAX) 500 mg in sodium chloride 0.9 % 250 mL IVPB (0 mg Intravenous Stopped 03/21/23 0042)  amiodarone (PACERONE) tablet 200 mg (has no administration in time range)  apixaban (ELIQUIS) tablet 2.5 mg (has no administration in time range)  diltiazem (CARDIZEM CD) 24 hr capsule 360 mg (has no administration in time range)  metoprolol succinate (TOPROL-XL) 24 hr tablet 25 mg (has no administration in time range)  fluticasone furoate-vilanterol (BREO ELLIPTA) 100-25 MCG/ACT 1 puff (has no administration in time range)    And  umeclidinium bromide (INCRUSE ELLIPTA) 62.5 MCG/ACT 1 puff (has no administration in time range)  traZODone (DESYREL) tablet 100 mg (100 mg Oral Given 03/21/23 0028)  insulin aspart (novoLOG) injection 0-15 Units (has no administration in time range)  sodium chloride flush (NS) 0.9 % injection 3 mL (3 mLs Intravenous Given 03/21/23 0028)  acetaminophen (TYLENOL) tablet 650 mg (has no administration in time range)    Or  acetaminophen (TYLENOL) suppository 650 mg (has no administration in time range)  morphine (PF) 2 MG/ML injection 2 mg (has no administration in time range)  sodium chloride flush (NS) 0.9 % injection 3 mL (3 mLs Intravenous Given 03/21/23 0029)  sodium chloride flush (NS) 0.9 % injection 3 mL (has no administration in time range)  0.9 %  sodium chloride infusion (has

## 2023-03-20 NOTE — ED Provider Notes (Signed)
[facility] Provider Note    Event Date/Time   First MD Initiated Contact with Patient 03/20/23 1840     (approximate)   History   Post-op Problem   HPI  Leslie Parker is a 89 y.o. female with history of hypertension, A-fib, CKD, CHF presenting to the emergency department for evaluation of shortness of breath and weakness.  Patient was discharged from rehab about a week ago due to insurance reasons.  Family reports that patient has remained significantly weak since that time.  She has additionally developed a productive cough.  No fevers.  Has an ulcer over her decubitus region that has been present since she was discharged from rehab.  Daughter states that she had been on oxygen when she left rehab, but was not discharged with this and does not have this available in her home.      Physical Exam   Triage Vital Signs: ED Triage Vitals  Encounter Vitals Group     BP 03/20/23 1729 (!) 121/58     Systolic BP Percentile --      Diastolic BP Percentile --      Pulse Rate 03/20/23 1729 (!) 59     Resp 03/20/23 1729 18     Temp 03/20/23 1729 98.4 F (36.9 C)     Temp Source 03/20/23 1729 Oral     SpO2 03/20/23 1729 93 %     Weight 03/20/23 1730 178 lb 9.2 oz (81 kg)     Height 03/20/23 1730 5\' 9"  (1.753 m)     Head Circumference --      Peak Flow --      Pain Score 03/20/23 1730 0     Pain Loc --      Pain Education --      Exclude from Growth Chart --     Most recent vital signs: Vitals:   03/20/23 2356 03/21/23 0000  BP:  (!) 124/57  Pulse: 64 (!) 59  Resp: 12 13  Temp:    SpO2: 98% 97%     General: Awake, interactive  CV:  Regular rate, good peripheral perfusion. Resp:  Lung sounds coarse bilaterally, sats ranging from 87 to 91% on my evaluation Abd:  Soft, nondistended.  Neuro:  Symmetric facial movement, fluid speech, generalized weakness of the bilateral lower extremities, 5 out of 5 strength in the bilateral upper extremities,  sensation intact throughout the bilateral upper and lower extremities   ED Results / Procedures / Treatments   Labs (all labs ordered are listed, but only abnormal results are displayed) Labs Reviewed  COMPREHENSIVE METABOLIC PANEL - Abnormal; Notable for the following components:      Result Value   Sodium 133 (*)    CO2 20 (*)    Glucose, Bld 188 (*)    BUN 34 (*)    Creatinine, Ser 1.94 (*)    Calcium 8.2 (*)    Total Protein 5.9 (*)    Albumin 2.9 (*)    AST 63 (*)    ALT 122 (*)    Alkaline Phosphatase 312 (*)    GFR, Estimated 24 (*)    All other components within normal limits  CBC WITH DIFFERENTIAL/PLATELET - Abnormal; Notable for the following components:   WBC 10.8 (*)    RBC 3.52 (*)    Hemoglobin 11.0 (*)    HCT 34.6 (*)    Neutro Abs 8.2 (*)    All other components within normal limits  BRAIN NATRIURETIC PEPTIDE - Abnormal; Notable for the following components:   B Natriuretic Peptide 224.5 (*)    All other components within normal limits  RESP PANEL BY RT-PCR (RSV, FLU A&B, COVID)  [id number]  PROCALCITONIN     EKG EKG independently reviewed interpreted by myself (ER attending) demonstrates:  EKG demonstrates paced rhythm at a rate of 60, QRS 180, QTc 515, no of a superimposed ischemia  RADIOLOGY Imaging independently reviewed and interpreted by myself demonstrates:  CXR with retrocardiac opacities and cardiomegaly with possible edema on my review, radiology notes that this may be related to atelectasis  PROCEDURES:  Critical Care performed: No  Procedures   MEDICATIONS ORDERED IN ED: Medications  cefTRIAXone (ROCEPHIN) 2 g in sodium chloride 0.9 % 100 mL IVPB (0 g Intravenous Stopped 03/20/23 2325)  azithromycin (ZITHROMAX) 500 mg in sodium chloride 0.9 % 250 mL IVPB (500 mg Intravenous New Bag/Given 03/20/23 2337)  amiodarone (PACERONE) tablet 200 mg (has no administration in time range)  apixaban (ELIQUIS) tablet 2.5 mg (has no administration in  time range)  diltiazem (CARDIZEM CD) 24 hr capsule 360 mg (has no administration in time range)  metoprolol succinate (TOPROL-XL) 24 hr tablet 25 mg (has no administration in time range)  fluticasone furoate-vilanterol (BREO ELLIPTA) 100-25 MCG/ACT 1 puff (has no administration in time range)    And  umeclidinium bromide (INCRUSE ELLIPTA) 62.5 MCG/ACT 1 puff (has no administration in time range)  traZODone (DESYREL) tablet 100 mg (has no administration in time range)  furosemide (LASIX) injection 20 mg (20 mg Intravenous Given 03/20/23 2212)     IMPRESSION / MDM / ASSESSMENT AND PLAN / ED COURSE  I reviewed the triage vital signs and the nursing notes.  Differential diagnosis includes, but is not limited to, pneumonia, CHF exacerbation, COPD exacerbation, anemia, electrolyte abnormality  Patient's presentation is most consistent with acute presentation with potential threat to life or bodily function.  89 year old female presenting with weakness and shortness of breath.  Has a productive cough on exam with coarse lung sounds. Labs with mild leukocytosis WC of 10.8, improved from recent, improved anemia.  CMP with stable elevated creatinine, transaminitis without recent LFTs for comparison.  X-Neeko Pharo with questionable pneumonia and fluid.  Right upper quadrant ultrasound without acute findings.  Patient was hypoxic during my evaluation.  Question pneumonia versus possible component of fluid overload.  BNP slightly elevated.  Given a dose of Lasix.  Will also go ahead and treat for pneumonia given cough and abnormal x-Karisma Meiser.  No evidence of sepsis.  Will reach out to hospitalist team to discuss admission.  Reviewed with Dr. Allena Katz.  She will evaluate the patient for anticipated admission.     FINAL CLINICAL IMPRESSION(S) / ED DIAGNOSES   Final diagnoses:  Shortness of breath  Pneumonia due to infectious organism, unspecified laterality, unspecified part of lung     Rx / DC Orders   ED  Discharge Orders     None        Note:  This document was prepared using Dragon voice recognition software and may include unintentional dictation errors.   Trinna Post, MD 03/21/23 [phone]

## 2023-03-20 NOTE — ED Notes (Signed)
Per granddaughter request (POA) called back but no answer.

## 2023-03-20 NOTE — Assessment & Plan Note (Signed)
Lab Results  Component Value Date   CREATININE 1.94 (H) 03/20/2023   CREATININE 2.19 (H) 02/14/2023   CREATININE 1.74 (H) 02/13/2023  Stable. Avoid contrast.  Renally dose meds.

## 2023-03-20 NOTE — Assessment & Plan Note (Addendum)
Continue eliquis/ amiodarone / diltiazem and metoprolol.

## 2023-03-21 ENCOUNTER — Inpatient Hospital Stay: Payer: Medicare HMO

## 2023-03-21 DIAGNOSIS — I480 Paroxysmal atrial fibrillation: Secondary | ICD-10-CM | POA: Diagnosis present

## 2023-03-21 DIAGNOSIS — I358 Other nonrheumatic aortic valve disorders: Secondary | ICD-10-CM | POA: Diagnosis present

## 2023-03-21 DIAGNOSIS — J90 Pleural effusion, not elsewhere classified: Secondary | ICD-10-CM | POA: Diagnosis not present

## 2023-03-21 DIAGNOSIS — Z8249 Family history of ischemic heart disease and other diseases of the circulatory system: Secondary | ICD-10-CM | POA: Diagnosis not present

## 2023-03-21 DIAGNOSIS — Z96653 Presence of artificial knee joint, bilateral: Secondary | ICD-10-CM | POA: Diagnosis present

## 2023-03-21 DIAGNOSIS — L89153 Pressure ulcer of sacral region, stage 3: Secondary | ICD-10-CM | POA: Diagnosis present

## 2023-03-21 DIAGNOSIS — F419 Anxiety disorder, unspecified: Secondary | ICD-10-CM | POA: Diagnosis present

## 2023-03-21 DIAGNOSIS — J9601 Acute respiratory failure with hypoxia: Secondary | ICD-10-CM | POA: Diagnosis present

## 2023-03-21 DIAGNOSIS — I130 Hypertensive heart and chronic kidney disease with heart failure and stage 1 through stage 4 chronic kidney disease, or unspecified chronic kidney disease: Secondary | ICD-10-CM | POA: Diagnosis present

## 2023-03-21 DIAGNOSIS — R918 Other nonspecific abnormal finding of lung field: Secondary | ICD-10-CM | POA: Diagnosis not present

## 2023-03-21 DIAGNOSIS — E1122 Type 2 diabetes mellitus with diabetic chronic kidney disease: Secondary | ICD-10-CM | POA: Diagnosis present

## 2023-03-21 DIAGNOSIS — D631 Anemia in chronic kidney disease: Secondary | ICD-10-CM | POA: Diagnosis present

## 2023-03-21 DIAGNOSIS — J189 Pneumonia, unspecified organism: Secondary | ICD-10-CM | POA: Diagnosis present

## 2023-03-21 DIAGNOSIS — J9811 Atelectasis: Secondary | ICD-10-CM | POA: Diagnosis present

## 2023-03-21 DIAGNOSIS — I482 Chronic atrial fibrillation, unspecified: Secondary | ICD-10-CM | POA: Diagnosis present

## 2023-03-21 DIAGNOSIS — E871 Hypo-osmolality and hyponatremia: Secondary | ICD-10-CM | POA: Diagnosis present

## 2023-03-21 DIAGNOSIS — R0989 Other specified symptoms and signs involving the circulatory and respiratory systems: Secondary | ICD-10-CM | POA: Diagnosis not present

## 2023-03-21 DIAGNOSIS — R600 Localized edema: Secondary | ICD-10-CM | POA: Diagnosis not present

## 2023-03-21 DIAGNOSIS — D649 Anemia, unspecified: Secondary | ICD-10-CM | POA: Diagnosis present

## 2023-03-21 DIAGNOSIS — R0602 Shortness of breath: Secondary | ICD-10-CM | POA: Diagnosis present

## 2023-03-21 DIAGNOSIS — J440 Chronic obstructive pulmonary disease with acute lower respiratory infection: Secondary | ICD-10-CM | POA: Diagnosis present

## 2023-03-21 DIAGNOSIS — Z7984 Long term (current) use of oral hypoglycemic drugs: Secondary | ICD-10-CM | POA: Diagnosis not present

## 2023-03-21 DIAGNOSIS — Z96611 Presence of right artificial shoulder joint: Secondary | ICD-10-CM | POA: Diagnosis not present

## 2023-03-21 DIAGNOSIS — E669 Obesity, unspecified: Secondary | ICD-10-CM | POA: Diagnosis present

## 2023-03-21 DIAGNOSIS — E041 Nontoxic single thyroid nodule: Secondary | ICD-10-CM | POA: Diagnosis not present

## 2023-03-21 DIAGNOSIS — N179 Acute kidney failure, unspecified: Secondary | ICD-10-CM | POA: Diagnosis present

## 2023-03-21 DIAGNOSIS — N184 Chronic kidney disease, stage 4 (severe): Secondary | ICD-10-CM | POA: Diagnosis present

## 2023-03-21 DIAGNOSIS — Z7901 Long term (current) use of anticoagulants: Secondary | ICD-10-CM | POA: Diagnosis not present

## 2023-03-21 DIAGNOSIS — Z1152 Encounter for screening for COVID-19: Secondary | ICD-10-CM | POA: Diagnosis not present

## 2023-03-21 DIAGNOSIS — I5033 Acute on chronic diastolic (congestive) heart failure: Secondary | ICD-10-CM | POA: Diagnosis present

## 2023-03-21 DIAGNOSIS — M79604 Pain in right leg: Secondary | ICD-10-CM | POA: Diagnosis not present

## 2023-03-21 DIAGNOSIS — E876 Hypokalemia: Secondary | ICD-10-CM | POA: Diagnosis present

## 2023-03-21 DIAGNOSIS — M79605 Pain in left leg: Secondary | ICD-10-CM | POA: Diagnosis not present

## 2023-03-21 LAB — CBC
HCT: 30 % — ABNORMAL LOW (ref 36.0–46.0)
Hemoglobin: 9.7 g/dL — ABNORMAL LOW (ref 12.0–15.0)
MCH: 30.8 pg (ref 26.0–34.0)
MCHC: 32.3 g/dL (ref 30.0–36.0)
MCV: 95.2 fL (ref 80.0–100.0)
Platelets: 222 10*3/uL (ref 150–400)
RBC: 3.15 MIL/uL — ABNORMAL LOW (ref 3.87–5.11)
RDW: 15.2 % (ref 11.5–15.5)
WBC: 7.7 10*3/uL (ref 4.0–10.5)
nRBC: 0 % (ref 0.0–0.2)

## 2023-03-21 LAB — BLOOD GAS, VENOUS
Acid-Base Excess: 1.5 mmol/L (ref 0.0–2.0)
Bicarbonate: 26.6 mmol/L (ref 20.0–28.0)
O2 Saturation: 85 %
Patient temperature: 37
pCO2, Ven: 43 mmHg — ABNORMAL LOW (ref 44–60)
pH, Ven: 7.4 (ref 7.25–7.43)
pO2, Ven: 52 mmHg — ABNORMAL HIGH (ref 32–45)

## 2023-03-21 LAB — COMPREHENSIVE METABOLIC PANEL
ALT: 94 U/L — ABNORMAL HIGH (ref 0–44)
AST: 40 U/L (ref 15–41)
Albumin: 2.5 g/dL — ABNORMAL LOW (ref 3.5–5.0)
Alkaline Phosphatase: 274 U/L — ABNORMAL HIGH (ref 38–126)
Anion gap: 6 (ref 5–15)
BUN: 35 mg/dL — ABNORMAL HIGH (ref 8–23)
CO2: 23 mmol/L (ref 22–32)
Calcium: 7.6 mg/dL — ABNORMAL LOW (ref 8.9–10.3)
Chloride: 104 mmol/L (ref 98–111)
Creatinine, Ser: 2.06 mg/dL — ABNORMAL HIGH (ref 0.44–1.00)
GFR, Estimated: 23 mL/min — ABNORMAL LOW (ref >60)
Glucose, Bld: 185 mg/dL — ABNORMAL HIGH (ref 70–99)
Potassium: 3.6 mmol/L (ref 3.5–5.1)
Sodium: 133 mmol/L — ABNORMAL LOW (ref 135–145)
Total Bilirubin: 0.8 mg/dL (ref 0.3–1.2)
Total Protein: 5.1 g/dL — ABNORMAL LOW (ref 6.5–8.1)

## 2023-03-21 LAB — GLUCOSE, CAPILLARY
Glucose-Capillary: 150 mg/dL — ABNORMAL HIGH (ref 70–99)
Glucose-Capillary: 148 mg/dL — ABNORMAL HIGH (ref 70–99)
Glucose-Capillary: 182 mg/dL — ABNORMAL HIGH (ref 70–99)

## 2023-03-21 LAB — HEMOGLOBIN A1C
Hgb A1c MFr Bld: 5.8 % — ABNORMAL HIGH (ref 4.8–5.6)
Mean Plasma Glucose: 119.76 mg/dL

## 2023-03-21 LAB — PROCALCITONIN: Procalcitonin: 0.12 ng/mL

## 2023-03-21 LAB — MAGNESIUM: Magnesium: 1.6 mg/dL — ABNORMAL LOW (ref 1.7–2.4)

## 2023-03-21 MED ORDER — FUROSEMIDE 40 MG PO TABS: 40.0000 mg | ORAL_TABLET | Freq: Every day | ORAL | Status: DC

## 2023-03-21 MED ORDER — SODIUM CHLORIDE 0.9% FLUSH
3.0000 mL | Freq: Two times a day (BID) | INTRAVENOUS | Status: DC
  Administered 2023-03-21 – 2023-04-01 (×13): 3 mL via INTRAVENOUS

## 2023-03-21 MED ORDER — MORPHINE SULFATE (PF) 2 MG/ML IV SOLN
2.0000 mg | INTRAVENOUS | Status: DC | PRN
  Administered 2023-03-22 – 2023-03-31 (×8): 2 mg via INTRAVENOUS
  Filled 2023-03-21 (×8): qty 1

## 2023-03-21 MED ORDER — SODIUM CHLORIDE 0.9% FLUSH: 3.0000 mL | INTRAVENOUS | Status: DC | PRN

## 2023-03-21 MED ORDER — INSULIN ASPART 100 UNIT/ML IJ SOLN: 0.0000 [IU] | INTRAMUSCULAR | Status: DC | PRN

## 2023-03-21 MED ORDER — SODIUM CHLORIDE 0.9% FLUSH
3.0000 mL | Freq: Two times a day (BID) | INTRAVENOUS | Status: DC
  Administered 2023-03-21 – 2023-04-01 (×22): 3 mL via INTRAVENOUS

## 2023-03-21 MED ORDER — MAGNESIUM SULFATE 2 GM/50ML IV SOLN
2.0000 g | Freq: Once | INTRAVENOUS | Status: AC
  Administered 2023-03-21: 2 g via INTRAVENOUS
  Filled 2023-03-21: qty 50

## 2023-03-21 MED ORDER — LEVALBUTEROL HCL 0.63 MG/3ML IN NEBU: 0.6300 mg | INHALATION_SOLUTION | Freq: Four times a day (QID) | RESPIRATORY_TRACT | Status: DC | PRN

## 2023-03-21 MED ORDER — ACETAMINOPHEN 325 MG PO TABS
650.0000 mg | ORAL_TABLET | Freq: Four times a day (QID) | ORAL | Status: DC | PRN
  Administered 2023-03-21 – 2023-03-28 (×2): 650 mg via ORAL
  Filled 2023-03-21 (×2): qty 2

## 2023-03-21 MED ORDER — FUROSEMIDE 10 MG/ML IJ SOLN
20.0000 mg | Freq: Two times a day (BID) | INTRAMUSCULAR | Status: DC
  Administered 2023-03-21: 20 mg via INTRAVENOUS
  Filled 2023-03-21: qty 2

## 2023-03-21 MED ORDER — FUROSEMIDE 10 MG/ML IJ SOLN: 20.0000 mg | Freq: Two times a day (BID) | INTRAMUSCULAR | Status: DC

## 2023-03-21 MED ORDER — SODIUM CHLORIDE 0.9 % IV SOLN
250.0000 mL | INTRAVENOUS | Status: DC | PRN
  Administered 2023-03-23: 250 mL via INTRAVENOUS

## 2023-03-21 MED ORDER — FUROSEMIDE 10 MG/ML IJ SOLN
40.0000 mg | Freq: Every day | INTRAMUSCULAR | Status: DC
  Administered 2023-03-21 – 2023-03-25 (×5): 40 mg via INTRAVENOUS
  Filled 2023-03-21 (×5): qty 4

## 2023-03-21 MED ORDER — ACETAMINOPHEN 650 MG RE SUPP: 650.0000 mg | Freq: Four times a day (QID) | RECTAL | Status: DC | PRN

## 2023-03-21 NOTE — ED Notes (Signed)
VBG collected and placed on ice. RT notified

## 2023-03-21 NOTE — Evaluation (Signed)
Physical Therapy Evaluation Patient Details Name: Leslie Parker MRN: [id number] DOB: 11-Sep-1932 Today's Date: 03/21/2023  History of Present Illness  Pt is an 89 year old female presenting with hypoxia, admitted with CHF vs PNA     PMH significant for ongestive heart failure, hypertension, asthma, A-fib presenting with shortness of breath.  Patient recently had hip surgery (ORIF, 01/2023) and was discharged from rehab (approx two weeks prior to this admission)  Clinical Impression  Patient resting in bed upon arrival to room; alert and oriented, follows commands and agreeable to participation with treatment session. Does require mod encouragement for attempts at progressive mobility, but does respond well to encouragement from therapist.  Denies pain; does endorse intermittent nausea prior to therapy session (no symptoms noted during session).  Strength and ROM assessment for generalized weakness throughout R LE (grossly 3-/5); otherwise, bilat UE/LE grossly [facility] for basic transfer and gait.  Currently requiring mod assist +2 for bed mobility; min assist for sit/stand, standing balance and short-distance gait (10') with RW, cga/min assist.  Demonstrates forward flexed posture, short shuffling steps with decreased step height/length bilat; mildly antalgic. Mod SOB with gait efforts, desat to 88% within 10'; recovers >92% with seated rest and PLB  Would benefit from skilled PT to address above deficits and promote optimal return to PLOF.; recommend post-acute PT follow up as indicated by interdisciplinary care team.          If plan is discharge home, recommend the following: A little help with walking and/or transfers;A little help with bathing/dressing/bathroom   Can travel by private vehicle        Equipment Recommendations  (has necessary DME)  Recommendations for Other Services       Functional Status Assessment Patient has had a recent decline in their functional status and demonstrates the  ability to make significant improvements in function in a reasonable and predictable amount of time.     Precautions / Restrictions Precautions Precautions: Fall Restrictions Weight Bearing Restrictions: No      Mobility  Bed Mobility Overal bed mobility: Needs Assistance Bed Mobility: Supine to Sit     Supine to sit: Mod assist, +2 for physical assistance, +2 for safety/equipment, HOB elevated     General bed mobility comments: frequent vcs for technique    Transfers Overall transfer level: Needs assistance Equipment used: Rolling walker (2 wheels) Transfers: Sit to/from Stand, Bed to chair/wheelchair/BSC Sit to Stand: Min assist, +2 physical assistance Stand pivot transfers: Min assist, +2 physical assistance         General transfer comment: +2, progressing to +1 with MIN A and RW; frequent cuing for walker position and management    Ambulation/Gait Ambulation/Gait assistance: Min assist, +2 safety/equipment Gait Distance (Feet): 10 Feet Assistive device: Rolling walker (2 wheels)         General Gait Details: forward flexed posture, short shuffling steps with decreased step height/length bilat; mildly antalgic.  Mod SOB with gait efforts, desat to 88% within 10'; recovers >92% with seated rest and PLB  Stairs            Wheelchair Mobility     Tilt Bed    Modified Rankin (Stroke Patients Only)       Balance Overall balance assessment: Needs assistance Sitting-balance support: No upper extremity supported, Feet supported Sitting balance-[name]: Good     Standing balance support: Bilateral upper extremity supported, Reliant on assistive device for balance, During functional activity Standing balance-[name]: Fair  Pertinent Vitals/Pain Pain Assessment Pain Assessment: No/denies pain    Home Living Family/patient expects to be discharged to:: Private residence Living Arrangements:  Alone Available Help at Discharge: Family;Other (Comment) (granddaughter has been staying with patient, providing assist as needed; daughter also coming from out of state to stay with/assist upon discharge) Type of Home: Apartment Home Access: Level entry       Home Layout: One level Home Equipment: Rollator (4 wheels);Cane - single point;Grab bars - tub/shower;Grab bars - toilet;Lift chair;Transport chair Additional Comments: Senior living apartment; pt has been sleeping in her lift chair    Prior Function Prior Level of Function : Needs assist       Physical Assist : Mobility (physical);ADLs (physical) Mobility (physical): Bed mobility;Transfers;Gait;Stairs ADLs (physical): Bathing;Dressing;Toileting;IADLs Mobility Comments: was indep prior to admission in July, now amb with RW short household distances ADLs Comments: pt was MOD I in bathing, dressing, toileting; indep in feeding/grooming prior to admission in July, now pt requires at least Min assist for dressing, bathing, toileting, all IADLS     Extremity/Trunk Assessment   Upper Extremity Assessment Upper Extremity Assessment: Overall WFL for tasks assessed    Lower Extremity Assessment Lower Extremity Assessment: Generalized weakness (R LE grossly 3-/5 throughout, L LE grossly 4-/5 throughout) RLE Deficits / Details: s/p hip IMN in July 2024       Communication   Communication Communication: No apparent difficulties Cueing Techniques: Verbal cues;Tactile cues;Visual cues  Cognition Arousal: Alert Behavior During Therapy: WFL for tasks assessed/performed Overall Cognitive Status: Within Functional Limits for tasks assessed                                 General Comments: agreeable to participate with encouragement, mildly anxious at times        General Comments General comments (skin integrity, edema, etc.): spo2 down to 88% with mobility on 2 L via [place]; above 90% after approx 30 seconds     Exercises     Assessment/Plan    PT Assessment Patient needs continued PT services  PT Problem List Decreased strength;Decreased activity tolerance;Decreased balance;Decreased mobility;Decreased range of motion;Decreased coordination;Decreased knowledge of use of DME;Decreased safety awareness;Decreased knowledge of precautions;Cardiopulmonary status limiting activity;Decreased skin integrity       PT Treatment Interventions DME instruction;Gait training;Stair training;Functional mobility training;Therapeutic activities;Therapeutic exercise;Balance training;Patient/family education    PT Goals (Current goals can be found in the Care Plan section)  Acute Rehab PT Goals Patient Stated Goal: to get my strength back PT Goal Formulation: With patient Time For Goal Achievement: 04/04/23 Potential to Achieve Goals: Good    Frequency Min 1X/week     Co-evaluation PT/OT/SLP Co-Evaluation/Treatment: Yes Reason for Co-Treatment: Complexity of the patient's impairments (multi-system involvement);To address functional/ADL transfers   OT goals addressed during session: ADL's and self-care       AM-PAC PT "6 Clicks" Mobility  Outcome Measure Help needed turning from your back to your side while in a flat bed without using bedrails?: A Little Help needed moving from lying on your back to sitting on the side of a flat bed without using bedrails?: A Lot Help needed moving to and from a bed to a chair (including a wheelchair)?: A Little Help needed standing up from a chair using your arms (e.g., wheelchair or bedside chair)?: A Little Help needed to walk in hospital room?: A Lot Help needed climbing 3-5 steps with a railing? : A Lot 6  Click Score: 15    End of Session   Activity Tolerance: Patient tolerated treatment well Patient left: in chair;with call bell/phone within reach;with chair alarm set Nurse Communication: Mobility status PT Visit Diagnosis: Muscle weakness (generalized)  (M62.81);Difficulty in walking, not elsewhere classified (R26.2)    Time: 6045-4098 PT Time Calculation (min) (ACUTE ONLY): 18 min   Charges:   PT Evaluation $PT Eval Moderate Complexity: 1 Mod   PT General Charges $$ ACUTE PT VISIT: 1 Visit         Betheny Suchecki H. Manson Passey, PT, DPT, NCS 03/21/23, 2:55 PM [phone]

## 2023-03-21 NOTE — TOC Initial Note (Signed)
Transition of Care [facility]) - Initial/Assessment Note    Patient Details  Name: Leslie Parker MRN: [id number] Date of Birth: July 18, 1933  [facility] CM/SW Contact:    Truddie Hidden, RN Phone Number: 03/21/2023, 1:14 PM  Clinical Narrative:                 Admitted for:SOB Admitted from: Home alone. Lives in a senior apartment [facility]  Current home health/prior home health/DME:Walker, rollator HH: no preference if needed  Transportation: granddaughter takes her appointments and will transport her home at discharge.   Expected Discharge Plan: Home/Self Care Barriers to Discharge: Continued Medical Work up   Patient Goals and CMS Choice Patient states their goals for this hospitalization and ongoing recovery are:: return home          Expected Discharge Plan and Services       Living arrangements for the past 2 months: Single Family Home                                      Prior Living Arrangements/Services Living arrangements for the past 2 months: Single Family Home Lives with:: Self Patient language and need for interpreter reviewed:: Yes Do you feel safe going back to the place where you live?: Yes      Need for Family Participation in Patient Care: Yes (Comment) Care giver support system in place?: Yes (comment)   Criminal Activity/Legal Involvement Pertinent to Current Situation/Hospitalization: No - Comment as needed  Activities of Daily Living Home Assistive Devices/Equipment: Cane (specify quad or straight), Walker (specify type) ADL Screening (condition at time of admission) Patient's cognitive ability adequate to safely complete daily activities?: Yes Is the patient deaf or have difficulty hearing?: No Does the patient have difficulty seeing, even when wearing glasses/contacts?: No Does the patient have difficulty concentrating, remembering, or making decisions?: No Patient able to express need for assistance with ADLs?:  Yes Does the patient have difficulty dressing or bathing?: Yes Independently performs ADLs?: No Communication: Independent Dressing (OT): Needs assistance Is this a change from baseline?: Change from baseline, expected to last >3 days Grooming: Independent Feeding: Independent Bathing: Needs assistance Is this a change from baseline?: Change from baseline, expected to last >3 days Toileting: Needs assistance Is this a change from baseline?: Change from baseline, expected to last >3days In/Out Bed: Needs assistance Is this a change from baseline?: Change from baseline, expected to last >3 days Walks in Home: Needs assistance Is this a change from baseline?: Change from baseline, expected to last >3 days Does the patient have difficulty walking or climbing stairs?: Yes Weakness of Legs: Both Weakness of Arms/Hands: None  Permission Sought/Granted                  Emotional Assessment Appearance:: Appears stated age Attitude/Demeanor/Rapport: Gracious, Engaged Affect (typically observed): Accepting Orientation: : Oriented to Self, Oriented to Place, Oriented to  Time, Oriented to Situation Alcohol / Substance Use: Not Applicable Psych Involvement: No (comment)  Admission diagnosis:  Shortness of breath [R06.02] SOB (shortness of breath) [R06.02] Pneumonia due to infectious organism, unspecified laterality, unspecified part of lung [J18.9] Patient Active Problem List   Diagnosis Date Noted   Anemia 03/21/2023   Abnormal LFTs 03/20/2023   Closed right hip fracture (HCC) 02/13/2023   Fall at home, initial encounter 02/13/2023   Type II diabetes mellitus with renal manifestations (HCC)  02/13/2023   Chronic diastolic CHF (congestive heart failure) (HCC) 02/13/2023   COPD (chronic obstructive pulmonary disease) (HCC) 02/13/2023   Leukocytosis 02/13/2023   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   AKI (acute kidney injury) (HCC) 11/24/2022   CKD stage 3 due to type 2 diabetes  mellitus (HCC) 11/24/2022   UTI (urinary tract infection) 11/24/2022   Bradycardia 11/24/2022   Paroxysmal atrial fibrillation (HCC) 11/24/2022   Sick sinus syndrome (HCC) 11/15/2022   Acute on chronic diastolic CHF (congestive heart failure) (HCC) 08/27/2022   COPD exacerbation (HCC) 08/23/2022   Chronic a-fib (HCC) 08/22/2022   Respiratory distress 08/22/2022   Atrial fibrillation with rapid ventricular response (HCC) 06/06/2022   Rapid atrial fibrillation, new onset(HCC) 06/05/2022   Diabetes mellitus without complication (HCC)    Stage 3b chronic kidney disease (HCC)    Lactic acidosis    Postural dizziness with presyncope    Community acquired pneumonia    Hypomagnesemia    Sepsis (HCC) 09/03/2020   Acute cystitis without hematuria    Troponin I above reference range    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 [place] [address] Phone: [phone] Fax: [phone]  [id number] [place], [place] - [address] [place] [address] Phone: [phone] Fax: [phone]  [id number] [place] - [address] [place] [address] Phone: [phone] Fax: [phone]     Social Determinants of Health (SDOH) Social History: SDOH Screenings   Food Insecurity: No Food Insecurity (03/21/2023)  Housing: Low Risk  (03/21/2023)  Transportation Needs: No Transportation Needs (03/21/2023)  Utilities: Not At Risk (03/21/2023)  Tobacco Use: Low Risk  (03/20/2023)   SDOH  Interventions:     Readmission Risk Interventions    03/21/2023    1:12 PM 08/27/2022   12:05 PM  Readmission Risk Prevention Plan  Transportation Screening Complete Complete  PCP or Specialist Appt within 3-5 Days Complete Complete  HRI or Home Care Consult Complete Complete  Social Work Consult for Recovery Care Planning/Counseling Complete   Palliative Care Screening Not Applicable   Medication Review Oceanographer) Complete Complete

## 2023-03-21 NOTE — Assessment & Plan Note (Addendum)
Pt had [place] today showing: IMPRESSION: Status post cholecystectomy with compensatory enlargement of the common bile duct.Simple cysts are noted within the right kidney. No follow-up is recommended.  Suspect 2/2 to passive hepatic congestion.

## 2023-03-21 NOTE — Assessment & Plan Note (Signed)
    Latest Ref Rng & Units 03/20/2023    5:32 PM 02/15/2023    4:10 AM 02/14/2023    5:39 AM  CBC  WBC 4.0 - 10.5 K/uL 10.8  16.8  12.2   Hemoglobin 12.0 - 15.0 g/dL 86.5  7.9  8.5   Hematocrit 36.0 - 46.0 % 34.6  22.9  25.0   Platelets 150 - 400 K/uL 271  150  162   Hemoglobin is stable at 11, mildly elevated white count could be from infection or stress otherwise differentials are normal.  We will type and screen and follow transfuse as deemed appropriate.

## 2023-03-21 NOTE — Consult Note (Signed)
WOC Nurse Consult Note: Reason for Consult:sacral pressure injury Patient self reports she got a pressure injury while in rehab facility. She has HHRN who comes into the home for treatment. She is pending some type of cushion from [facility] for pressure relief for her chair (recliner) Wound type: Stage 3 Pressure Injury; right upper inner gluteal cleft  Pressure Injury POA: Yes Measurement:1.5cm x 1.0cm x 0.3cm  Wound bed:100% pink Drainage (amount, consistency, odor) none Periwound: epibole  Dressing procedure/placement/frequency: Cleanse with saline Apply silver hydrofiber  Top with foam Change every other day  [facility] # [id number] chair pressure redistribution pad, requested unit secretary to order for patient.   Made patient aware to use at home, take home with her. Cover with pillow case when using   Discussed POC with patient and bedside nurse.  Re consult if needed, will not follow at this time. Thanks  Simya Tercero [employer], RN,CWOCN, CNS, CWON-AP [phone])

## 2023-03-21 NOTE — ED Notes (Addendum)
Pt requested milk and saltines. Provided.

## 2023-03-21 NOTE — Discharge Instructions (Addendum)

## 2023-03-21 NOTE — Progress Notes (Signed)
Progress Note   Patient: Leslie Parker:[id number] DOB: 21-May-1933 DOA: 03/20/2023     0 DOS: the patient was seen and examined on 03/21/2023   Brief hospital course:  Leslie Parker is a 89 y.o. female with medical history significant for congestive heart failure, hypertension, asthma, A-fib presenting with shortness of breath.  Patient recently had hip surgery and was discharged from rehab she is not on home oxygen and she is Coming in with hypoxia with O2 sats of 88% on room air patient's cough is clear what no reports of fevers or chills.  Patient states she lives alone but has been feeling weak.  No reports of chest pain palpitations.  Patient does report nausea but no vomiting.  Patient has been having swelling in both her legs. In the emergency room she is alert awake oriented afebrile is dyspneic and has auditory rales at bedside that we can hear without the stethoscope.  On oxygen currently at 2 L nasal cannula and is stable not in distress. Blood work in the emergency room is abnormal showing hyponatremia of 133, CKD stage IV with a creatinine at 1.94 and EGFR of 24.  LFTs are abnormal with ALT of 122 AST of 63 alk phos of 312, BNP of 224.5. CBC abnormal with a white count of 10.8 hemoglobin of 11 normal neutrophil count platelet counts are 271.  Respiratory panel is negative for influenza RSV and COVID, in the emergency room patient received 20 mg of Lasix IV. Chest x-ray done today shows retrocardiac strandy opacities like atelectasis.   Assessment and Plan:  Acute hypoxic respiratory failure Unclear etiology ??  Atelectasis Patient had room air pulse oximetry of 88% and is currently on 2 L of oxygen to maintain pulse oximetry greater than 92% Worked with physical therapy and noted to have a drop in her pulse oximetry to 88% with exertion but improved Incentive spirometry 10 x every hour while awake She will likely require home oxygen upon discharge    Acute on chronic diastolic  dysfunction CHF Patient has significant bilateral lower extremity swelling Continue Lasix 40 mg IV daily Continue metoprolol   Chronic a-fib (HCC): HR 67 Continue Amiodarone and metoprolol,  Continue eliquis as primary prophylaxis for an acute stroke Hold diltiazem for now      Essential hypertension Blood pressure stable on metoprolol     Type II diabetes mellitus with renal manifestations [facility]):  Patient has 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 Continue consistent carbohydrate diet    COPD (chronic obstructive pulmonary disease) (HCC):  Stable As needed bronchodilators    Physical deconditioning Following recent reduction and internal fixation of displaced intertrochanteric right hip fracture Patient was recently discharged from rehab but is still extremely weak Continue PT and OT   Sacral pressure injury (POA) Stage 3 Pressure Injury; right upper inner gluteal cleft  Pressure Injury POA: Yes Measurement:1.5cm x 1.0cm x 0.3cm  Wound bed:100% pink Drainage (amount, consistency, odor) none Periwound: epibole  Dressing procedure/placement/frequency: Cleanse with saline Apply silver hydrofiber  Top with foam Change every other day Will need chair pressure redistribution pad upon discharge     Subjective: Patient is seen and examined at bedside.  Complains of feeling very weak  Physical Exam: Vitals:   03/21/23 0218 03/21/23 0641 03/21/23 0751 03/21/23 1143  BP: (!) 119/52 (!) 130/53 (!) 125/57 (!) 140/55  Pulse: 61 60 64 (!) 59  Resp:   16 16  Temp: 98 F (36.7 C) 98.4 F (  36.9 C) 98 F (36.7 C) 98.5 F (36.9 C)  TempSrc: Oral Oral    SpO2: 97% 97% 91% 96%  Weight:  90.4 kg    Height:        Vitals and nursing note reviewed.  Constitutional:      General: She is comfortable and in no distress    Appearance: She is obese. She is ill-appearing.  HENT:     Head: Normocephalic and atraumatic.     Right Ear: Hearing and external ear  normal.     Left Ear: Hearing and external ear normal.     Nose: Nose normal. No nasal deformity.     Mouth/Throat:     Lips: Pink.     Tongue: No lesions.  Eyes:     General: Lids are normal.     Extraocular Movements: Extraocular movements intact.     Pupils: Pupils are equal, round, and reactive to light.  Cardiovascular:     Rate and Rhythm: Normal rate and regular rhythm.  Bradycardic    Heart sounds: Normal heart sounds.  Pulmonary:     Effort: Normal    Breath sounds: Rales at the bases Abdominal:     General: Bowel sounds are normal. There is no distension.     Palpations: Abdomen is soft. There is no mass.     Tenderness: There is no abdominal tenderness.  Musculoskeletal:     Right lower leg: Edema present.     Left lower leg: Edema present.  Skin:    General: Skin is warm.  Neurological:     General: No focal deficit present.     Mental Status: She is alert and oriented to person, place, and time.     Cranial Nerves: Cranial nerves 2-12 are intact.  Psychiatric:        Attention and Perception: Attention normal.        Mood and Affect: Mood normal.        Speech: Speech normal.        Behavior: Behavior normal. Behavior is cooperative.    Data Reviewed:  Labs reviewed.  BUN 35, creatinine 2.06 There are no new results to review at this time.  Family Communication: Plan of care discussed with patient in detail  Disposition: Status is: Inpatient Remains inpatient appropriate because: Continues to have an oxygen requirement.  Discharge planning  Planned Discharge Destination:  TBD    Time spent: 35 minutes  Author: Lucile Shutters, MD 03/21/2023 2:38 PM  For on call review [email or web address].

## 2023-03-21 NOTE — Assessment & Plan Note (Signed)
Replaced in ed.  ?

## 2023-03-21 NOTE — Evaluation (Signed)
Occupational Therapy Evaluation Patient Details Name: Leslie Parker MRN: [id number] DOB: [birth year] Today's Date: 03/21/2023   History of Present Illness Pt is an 89 year old female presenting with hypoxia, admitted with CHF vs PNA     PMH significant for ongestive heart failure, hypertension, asthma, A-fib presenting with shortness of breath.  Patient recently had hip surgery and was discharged from rehab   Clinical Impression   Chart reviewed, pt greeted in bed, alert and oriented x4, agreeable to co tx between OT and PT with encouragmenet. PTA pt has required some assist for ADL/IADL as she returned home from reahb approx 2 weeks prior to this current admission. She reports her grand daughter assists with dressing, bathing and IADLs. Pt reports she amb short distances with RW at this time. Pt presents with deficits in strength, endurance, activity tolerance, cardio/pulm status, balance affecting safe and optimal ADL completion. Pt will benefit from acute OT to address deficits and to facilitate optimal safe ADL completion.        If plan is discharge home, recommend the following: A little help with walking and/or transfers;A little help with bathing/dressing/bathroom;Direct supervision/assist for financial management;Assistance with cooking/housework;Direct supervision/assist for medications management;Assist for transportation;Help with stairs or ramp for entrance    Functional Status Assessment  Patient has had a recent decline in their functional status and demonstrates the ability to make significant improvements in function in a reasonable and predictable amount of time.  Equipment Recommendations  None recommended by OT (pt has recommended equipment)    Recommendations for Other Services       Precautions / Restrictions Precautions Precautions: Fall Restrictions Weight Bearing Restrictions: No      Mobility Bed Mobility Overal bed mobility: Needs Assistance Bed Mobility:  Supine to Sit     Supine to sit: Mod assist, +2 for physical assistance, +2 for safety/equipment, HOB elevated     General bed mobility comments: frequent vcs for technique    Transfers Overall transfer level: Needs assistance Equipment used: Rolling walker (2 wheels) Transfers: Sit to/from Stand Sit to Stand: Min assist, +2 physical assistance           General transfer comment: +2, progressing to +1 with MIN A and RW      Balance Overall balance assessment: Needs assistance Sitting-balance support: Feet supported Sitting balance-[name]: Good     Standing balance support: Bilateral upper extremity supported, Reliant on assistive device for balance, During functional activity Standing balance-[name]: Fair                             ADL either performed or assessed with clinical judgement   ADL Overall ADL's : Needs assistance/impaired Eating/Feeding: Set up;Sitting   Grooming: Wash/dry face;Sitting;Set up           Upper Body Dressing : Minimal assistance Upper Body Dressing Details (indicate cue type and reason): anticipate Lower Body Dressing: Maximal assistance Lower Body Dressing Details (indicate cue type and reason): socks Toilet Transfer: Contact guard assist;Rolling walker (2 wheels) Toilet Transfer Details (indicate cue type and reason): simulated, short amb transfer to bedside chair, intermittent vcs for technique   Toileting - Clothing Manipulation Details (indicate cue type and reason): pt is incontient at baseline at this time     Functional mobility during ADLs: Contact guard assist;Rolling walker (2 wheels);+2 for safety/equipment (approx 8' in room with RW and close chair follow)       Vision  Patient Visual Report: No change from baseline       Perception         Praxis         Pertinent Vitals/Pain Pain Assessment Pain Assessment: No/denies pain     Extremity/Trunk Assessment Upper Extremity  Assessment Upper Extremity Assessment: Overall WFL for tasks assessed   Lower Extremity Assessment Lower Extremity Assessment: Generalized weakness;Defer to PT evaluation;RLE deficits/detail RLE Deficits / Details: s/p hip IMN in July 2024       Communication Communication Communication: No apparent difficulties Cueing Techniques: Verbal cues;Tactile cues;Visual cues   Cognition Arousal: Alert Behavior During Therapy: WFL for tasks assessed/performed Overall Cognitive Status: Within Functional Limits for tasks assessed                                 General Comments: agreeable to participate with encouragement     General Comments  spo2 down to 88% with mobility on 2 L via [place]; above 90% after approx 30 seconds    Exercises Other Exercises Other Exercises: edu re: role of OT, role of rehab, discharge recommendations   Shoulder Instructions      Home Living Family/patient expects to be discharged to:: Private residence Living Arrangements: Alone Available Help at Discharge: Family;Other (Comment) (pt reports daughter is coming to stay from [place], grand daugther has been helping her with ADL/IADL) Type of Home: Apartment Home Access: Level entry     Home Layout: One level     Bathroom Shower/Tub: Chief Strategy Officer: Handicapped height Bathroom Accessibility: Yes   Home Equipment: Rollator (4 wheels);Cane - single point;Grab bars - tub/shower;Grab bars - toilet;Lift chair;Transport chair   Additional Comments: Senior living apartment; pt has been sleeping in her lift chair      Prior Functioning/Environment Prior Level of Function : Needs assist       Physical Assist : Mobility (physical);ADLs (physical) Mobility (physical): Bed mobility;Transfers;Gait;Stairs ADLs (physical): Bathing;Dressing;Toileting;IADLs Mobility Comments: was indep prior to admission in July, now amb with RW short household distances ADLs Comments: pt was  MOD I in bathing, dressing, toileting; indep in feeding/grooming prior to admission in July, now pt requires at least Min assist for dressing, bathing, toileting, all IADLS        OT Problem List: Decreased strength;Impaired balance (sitting and/or standing);Decreased activity tolerance;Decreased knowledge of use of DME or AE      OT Treatment/Interventions: Self-care/ADL training;Visual/perceptual remediation/compensation;Therapeutic exercise;Patient/family education;Balance training;DME and/or AE instruction    OT Goals(Current goals can be found in the care plan section) Acute Rehab OT Goals Patient Stated Goal: go home OT Goal Formulation: With patient Time For Goal Achievement: 04/04/23 Potential to Achieve Goals: Good ADL Goals Pt Will Perform Grooming: with modified independence;sitting;standing Pt Will Perform Lower Body Dressing: with modified independence;sitting/lateral leans Pt Will Transfer to Toilet: with modified independence;ambulating Pt Will Perform Toileting - Clothing Manipulation and hygiene: with modified independence;sitting/lateral leans  OT Frequency: Min 1X/week    Co-evaluation PT/OT/SLP Co-Evaluation/Treatment: Yes Reason for Co-Treatment: Complexity of the patient's impairments (multi-system involvement);To address functional/ADL transfers   OT goals addressed during session: ADL's and self-care      AM-PAC OT "6 Clicks" Daily Activity     Outcome Measure Help from another person eating meals?: None Help from another person taking care of personal grooming?: None Help from another person toileting, which includes using toliet, bedpan, or urinal?: A Lot Help from another person bathing (  including washing, rinsing, drying)?: A Lot Help from another person to put on and taking off regular upper body clothing?: A Little Help from another person to put on and taking off regular lower body clothing?: A Lot 6 Click Score: 17   End of Session Equipment  Utilized During Treatment: Rolling walker (2 wheels) Nurse Communication: Mobility status  Activity Tolerance: Patient tolerated treatment well Patient left: in chair;with call bell/phone within reach;with chair alarm set  OT Visit Diagnosis: Other abnormalities of gait and mobility (R26.89);Muscle weakness (generalized) (M62.81)                Time: 7846-9629 OT Time Calculation (min): 15 min Charges:  OT General Charges $OT Visit: 1 Visit OT Evaluation $OT Eval Moderate Complexity: 1 Mod  Oleta Mouse, OTD OTR/L  03/21/23, 3:39 PM

## 2023-03-22 DIAGNOSIS — R0602 Shortness of breath: Secondary | ICD-10-CM | POA: Diagnosis not present

## 2023-03-22 LAB — BASIC METABOLIC PANEL
Anion gap: 17 — ABNORMAL HIGH (ref 5–15)
BUN: 39 mg/dL — ABNORMAL HIGH (ref 8–23)
CO2: 24 mmol/L (ref 22–32)
Calcium: 8.5 mg/dL — ABNORMAL LOW (ref 8.9–10.3)
Chloride: 100 mmol/L (ref 98–111)
Creatinine, Ser: 1.83 mg/dL — ABNORMAL HIGH (ref 0.44–1.00)
GFR, Estimated: 26 mL/min — ABNORMAL LOW (ref >60)
Glucose, Bld: 103 mg/dL — ABNORMAL HIGH (ref 70–99)
Potassium: 3.2 mmol/L — ABNORMAL LOW (ref 3.5–5.1)
Sodium: 136 mmol/L (ref 135–145)

## 2023-03-22 MED ORDER — POTASSIUM CHLORIDE CRYS ER 20 MEQ PO TBCR
40.0000 meq | EXTENDED_RELEASE_TABLET | Freq: Once | ORAL | Status: AC
  Administered 2023-03-22: 40 meq via ORAL
  Filled 2023-03-22: qty 2

## 2023-03-22 MED ORDER — DILTIAZEM HCL ER COATED BEADS 300 MG PO CP24
300.0000 mg | ORAL_CAPSULE | Freq: Every day | ORAL | Status: DC
  Administered 2023-03-22 – 2023-04-01 (×11): 300 mg via ORAL
  Filled 2023-03-22 (×11): qty 1

## 2023-03-22 MED ORDER — GUAIFENESIN ER 600 MG PO TB12
1200.0000 mg | ORAL_TABLET | Freq: Two times a day (BID) | ORAL | Status: DC
  Administered 2023-03-22 – 2023-04-01 (×21): 1200 mg via ORAL
  Filled 2023-03-22 (×20): qty 2

## 2023-03-22 NOTE — Plan of Care (Signed)
  Problem: Education: Goal: Ability to demonstrate management of disease process will improve Outcome: Progressing Goal: Ability to verbalize understanding of medication therapies will improve Outcome: Progressing   Problem: Activity: Goal: Capacity to carry out activities will improve Outcome: Progressing   Problem: Cardiac: Goal: Ability to achieve and maintain adequate cardiopulmonary perfusion will improve Outcome: Progressing   Problem: Education: Goal: Ability to describe self-care measures that may prevent or decrease complications (Diabetes Survival Skills Education) will improve Outcome: Progressing   Problem: Coping: Goal: Ability to adjust to condition or change in health will improve Outcome: Progressing   Problem: Fluid Volume: Goal: Ability to maintain a balanced intake and output will improve Outcome: Progressing   Problem: Health Behavior/Discharge Planning: Goal: Ability to identify and utilize available resources and services will improve Outcome: Progressing Goal: Ability to manage health-related needs will improve Outcome: Progressing   Problem: Metabolic: Goal: Ability to maintain appropriate glucose levels will improve Outcome: Progressing   Problem: Nutritional: Goal: Maintenance of adequate nutrition will improve Outcome: Progressing Goal: Progress toward achieving an optimal weight will improve Outcome: Progressing   Problem: Skin Integrity: Goal: Risk for impaired skin integrity will decrease Outcome: Progressing   Problem: Tissue Perfusion: Goal: Adequacy of tissue perfusion will improve Outcome: Progressing   Problem: Education: Goal: Knowledge of General Education information will improve Description: Including pain rating scale, medication(s)/side effects and non-pharmacologic comfort measures Outcome: Progressing   Problem: Health Behavior/Discharge Planning: Goal: Ability to manage health-related needs will improve Outcome:  Progressing   Problem: Clinical Measurements: Goal: Ability to maintain clinical measurements within normal limits will improve Outcome: Progressing Goal: Will remain free from infection Outcome: Progressing Goal: Diagnostic test results will improve Outcome: Progressing Goal: Respiratory complications will improve Outcome: Progressing Goal: Cardiovascular complication will be avoided Outcome: Progressing   Problem: Activity: Goal: Risk for activity intolerance will decrease Outcome: Progressing   Problem: Nutrition: Goal: Adequate nutrition will be maintained Outcome: Progressing   Problem: Coping: Goal: Level of anxiety will decrease Outcome: Progressing   Problem: Elimination: Goal: Will not experience complications related to bowel motility Outcome: Progressing Goal: Will not experience complications related to urinary retention Outcome: Progressing   Problem: Pain Managment: Goal: General experience of comfort will improve Outcome: Progressing   Problem: Safety: Goal: Ability to remain free from injury will improve Outcome: Progressing   Problem: Skin Integrity: Goal: Risk for impaired skin integrity will decrease Outcome: Progressing

## 2023-03-22 NOTE — TOC Progression Note (Signed)
Transition of Care [facility]) - Progression Note    Patient Details  Name: Leslie Parker MRN: [id number] Date of Birth: 1932-08-02  Transition of Care [facility]) CM/SW Contact  Liliana Cline, LCSW Phone Number: 03/22/2023, 9:02 AM  Clinical Narrative:    CSW was notified that patient is active with [facility].    Expected Discharge Plan: Home/Self Care Barriers to Discharge: Continued Medical Work up  Expected Discharge Plan and Services       Living arrangements for the past 2 months: Single Family Home                                       Social Determinants of Health (SDOH) Interventions SDOH Screenings   Food Insecurity: No Food Insecurity (03/21/2023)  Housing: Low Risk  (03/21/2023)  Transportation Needs: No Transportation Needs (03/21/2023)  Utilities: Not At Risk (03/21/2023)  Tobacco Use: Low Risk  (03/20/2023)    Readmission Risk Interventions    03/21/2023    1:12 PM 08/27/2022   12:05 PM  Readmission Risk Prevention Plan  Transportation Screening Complete Complete  PCP or Specialist Appt within 3-5 Days Complete Complete  HRI or Home Care Consult Complete Complete  Social Work Consult for Recovery Care Planning/Counseling Complete   Palliative Care Screening Not Applicable   Medication Review [employer]) Complete Complete

## 2023-03-22 NOTE — Progress Notes (Signed)
Progress Note   Patient: Leslie Parker JYN:[id number] DOB: 24-May-1933 DOA: 03/20/2023     1 DOS: the patient was seen and examined on 03/22/2023   Brief hospital course:  Leslie Parker is a 89 y.o. female with medical history significant for congestive heart failure, hypertension, asthma, A-fib presenting with shortness of breath.  Patient recently had hip surgery and was discharged from rehab she is not on home oxygen and she is Coming in with hypoxia with O2 sats of 88% on room air patient's cough is clear what no reports of fevers or chills.  Patient states she lives alone but has been feeling weak.  No reports of chest pain palpitations.  Patient does report nausea but no vomiting.  Patient has been having swelling in both her legs. In the emergency room she is alert awake oriented afebrile is dyspneic and has auditory rales at bedside that we can hear without the stethoscope.  On oxygen currently at 2 L nasal cannula and is stable not in distress. Blood work in the emergency room is abnormal showing hyponatremia of 133, CKD stage IV with a creatinine at 1.94 and EGFR of 24.  LFTs are abnormal with ALT of 122 AST of 63 alk phos of 312, BNP of 224.5. CBC abnormal with a white count of 10.8 hemoglobin of 11 normal neutrophil count platelet counts are 271.  Respiratory panel is negative for influenza RSV and COVID, in the emergency room patient received 20 mg of Lasix IV. Chest x-ray done today shows retrocardiac strandy opacities like atelectasis.   Assessment and Plan:   Acute hypoxic respiratory failure Unclear etiology ??  Atelectasis, CAP. Chest x-ray shows Multifocal nodular opacity in the left lower lobe, possibly endobronchial. The largest nodule measures 11 mm. 23-month follow-up recommended to ensure resolution. Continue antibiotic therapy with Rocephin and Zithromax.  Start patient on antitussives Patient had room air pulse oximetry of 88% and is currently on 2 L of oxygen to maintain  pulse oximetry greater than 92% Worked with physical therapy and noted to have a drop in her pulse oximetry to 88% with exertion but improved Incentive spirometry 10 x every hour while awake She will likely require home oxygen upon discharge       Acute on chronic diastolic dysfunction CHF Patient has significant bilateral lower extremity swelling Continue Lasix 40 mg IV daily Continue metoprolol     Chronic a-fib (HCC): HR 67 Continue Amiodarone, diltiazem and metoprolol,  Continue eliquis as primary prophylaxis for an acute stroke        Essential hypertension Blood pressure stable on metoprolol and diltiazem       Type II diabetes mellitus with renal manifestations [facility]):  Patient has 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 Continue consistent carbohydrate diet     COPD (chronic obstructive pulmonary disease) (HCC):  Stable As needed bronchodilators     Physical deconditioning Following recent reduction and internal fixation of displaced intertrochanteric right hip fracture Patient was recently discharged from rehab but is still extremely weak Patient PT and OT were important to recommend home health upon discharge     Sacral pressure injury (POA) Stage 3 Pressure Injury; right upper inner gluteal cleft  Pressure Injury POA: Yes Measurement:1.5cm x 1.0cm x 0.3cm  Wound bed:100% pink Drainage (amount, consistency, odor) none Periwound: epibole  Dressing procedure/placement/frequency: Cleanse with saline Apply silver hydrofiber  Top with foam Change every other day Will need chair pressure redistribution pad upon discharge  Subjective: Patient is seen and examined at the bedside.  Has a wet sounding cough  Physical Exam: Vitals:   03/21/23 2321 03/22/23 0358 03/22/23 0832 03/22/23 1146  BP: (!) 143/62 (!) 160/67 (!) 177/66 (!) 162/72  Pulse: 61 60 70 76  Resp: 16 18 18 18   Temp: 98 F (36.7 C) 97.9 F (36.6 C) 98.1 F  (36.7 C) 98 F (36.7 C)  TempSrc: Oral     SpO2: 96% 97% 96% 95%  Weight:      Height:       Constitutional:      General: She is comfortable and in no distress    Appearance: She is obese. She is ill-appearing.  HENT:     Head: Normocephalic and atraumatic.     Right Ear: Hearing and external ear normal.     Left Ear: Hearing and external ear normal.     Nose: Nose normal. No nasal deformity.     Mouth/Throat:     Lips: Pink.     Tongue: No lesions.  Eyes:     General: Lids are normal.     Extraocular Movements: Extraocular movements intact.     Pupils: Pupils are equal, round, and reactive to light.  Cardiovascular:     Rate and Rhythm: Normal rate and regular rhythm.  Bradycardic    Heart sounds: Normal heart sounds.  Pulmonary:     Effort: Normal    Breath sounds: Rales at the bases Abdominal:     General: Bowel sounds are normal. There is no distension.     Palpations: Abdomen is soft. There is no mass.     Tenderness: There is no abdominal tenderness.  Musculoskeletal:     Right lower leg: Edema present.     Left lower leg: Edema present.  Skin:    General: Skin is warm.  Neurological:     General: No focal deficit present.     Mental Status: She is alert and oriented to person, place, and time.     Cranial Nerves: Cranial nerves 2-12 are intact.  Psychiatric:        Attention and Perception: Attention normal.        Mood and Affect: Mood normal.        Speech: Speech normal.        Behavior: Behavior normal. Behavior is cooperative.    Data Reviewed: Labs reviewed.  Potassium 3.2, creatinine 1.83 There are no new results to review at this time.  Family Communication: Plan of care discussed with patient in detail  Disposition: Status is: Inpatient  Planned Discharge Destination: Home with Home Health    Time spent: 33 minutes  Author: Lucile Shutters, MD 03/22/2023 1:03 PM  For on call review [email or web address].

## 2023-03-23 DIAGNOSIS — R0602 Shortness of breath: Secondary | ICD-10-CM | POA: Diagnosis not present

## 2023-03-23 LAB — MAGNESIUM: Magnesium: 1.7 mg/dL (ref 1.7–2.4)

## 2023-03-23 LAB — BASIC METABOLIC PANEL
Anion gap: 11 (ref 5–15)
BUN: 38 mg/dL — ABNORMAL HIGH (ref 8–23)
CO2: 22 mmol/L (ref 22–32)
Calcium: 8 mg/dL — ABNORMAL LOW (ref 8.9–10.3)
Chloride: 102 mmol/L (ref 98–111)
Creatinine, Ser: 1.66 mg/dL — ABNORMAL HIGH (ref 0.44–1.00)
GFR, Estimated: 29 mL/min — ABNORMAL LOW (ref >60)
Glucose, Bld: 143 mg/dL — ABNORMAL HIGH (ref 70–99)
Potassium: 4 mmol/L (ref 3.5–5.1)
Sodium: 135 mmol/L (ref 135–145)

## 2023-03-23 MED ORDER — ALUM & MAG HYDROXIDE-SIMETH 200-200-20 MG/5ML PO SUSP
30.0000 mL | ORAL | Status: DC | PRN
  Administered 2023-03-23 – 2023-03-25 (×2): 30 mL via ORAL
  Filled 2023-03-23 (×2): qty 30

## 2023-03-23 MED ORDER — AZITHROMYCIN 250 MG PO TABS
500.0000 mg | ORAL_TABLET | Freq: Every day | ORAL | Status: AC
  Administered 2023-03-23 – 2023-03-24 (×2): 500 mg via ORAL
  Filled 2023-03-23 (×2): qty 2

## 2023-03-23 NOTE — Progress Notes (Signed)
Pt complained of soreness on the area of her pacemaker, left chest. Non radiating, pain scale of 6/10. Pt expressed concern about being not on the tele monitor. Order was expired so they took it away today. She was also complaining of occasional burping. Dr. Para March made aware via secure chat. PRN Morphine IV given. Maalox ordered and given. Put pt on cardiac monitor.  2230H: Pt has no complains. Pt feels better.

## 2023-03-23 NOTE — Progress Notes (Signed)
Occupational Therapy Treatment Patient Details Name: Leslie Parker MRN: [id number] DOB: 1932-12-09 Today's Date: 03/23/2023   History of present illness Pt is an 89 year old female presenting with hypoxia, admitted with CHF vs PNA     PMH significant for ongestive heart failure, hypertension, asthma, A-fib presenting with shortness of breath.  Patient recently had hip surgery (ORIF, 01/2023) and was discharged from rehab (approx two weeks prior to this admission).   OT comments  Patient received sitting up in bed and agreeable to OT. Pt on 2L O2 via [place], VSS. Pt tolerated treatment session well. She required Max A for LB dressing, Min A for supine to sit, Min A for STS from EOB, and CGA to take side steps toward recliner using RW. Pt deferred further self-care tasks this date 2/2 just getting a bed bath from staff. Pt left in recliner with all needs in reach. Pt is making progress toward goal completion. D/C recommendation remains appropriate. OT will continue to follow acutely.        If plan is discharge home, recommend the following:  A little help with walking and/or transfers;A little help with bathing/dressing/bathroom;Direct supervision/assist for financial management;Assistance with cooking/housework;Direct supervision/assist for medications management;Assist for transportation;Help with stairs or ramp for entrance   Equipment Recommendations  None recommended by OT    Recommendations for Other Services      Precautions / Restrictions Precautions Precautions: Fall Restrictions Weight Bearing Restrictions: No       Mobility Bed Mobility Overal bed mobility: Needs Assistance Bed Mobility: Supine to Sit     Supine to sit: Min assist     General bed mobility comments: Min A for LLE, Min VC for technique    Transfers Overall transfer level: Needs assistance Equipment used: Rolling walker (2 wheels) Transfers: Sit to/from Stand Sit to Stand: Min assist (STS from EOB)          Balance Overall balance assessment: Needs assistance Sitting-balance support: No upper extremity supported, Feet supported Sitting balance-[name]: Good     Standing balance support: Bilateral upper extremity supported Standing balance-[name]: Fair         ADL either performed or assessed with clinical judgement   ADL Overall ADL's : Needs assistance/impaired     Grooming: Wash/dry face;Sitting;Set up       Lower Body Dressing: Maximal assistance Lower Body Dressing Details (indicate cue type and reason): B socks (pt reports using sock aid at home)  Toilet Transfer: Contact guard assist;Rolling walker (2 wheels) Toilet Transfer Details (indicate cue type and reason): simulated, short amb transfer to bedside chair, Min VC for technique         Functional mobility during ADLs: Contact guard assist;Rolling walker (2 wheels) (CGA for taking side steps from EOB>recliner + RW) General ADL Comments: Pt deferred further self-care tasks this date, reported just getting bed bath from NT.    Extremity/Trunk Assessment Upper Extremity Assessment Upper Extremity Assessment: Overall WFL for tasks assessed   Lower Extremity Assessment Lower Extremity Assessment: Generalized weakness        Vision Patient Visual Report: No change from baseline     Perception     Praxis      Cognition Arousal: Alert Behavior During Therapy: WFL for tasks assessed/performed Overall Cognitive Status: Within Functional Limits for tasks assessed              Exercises      Shoulder Instructions       General Comments  Pertinent Vitals/ Pain       Pain Assessment Pain Assessment: No/denies pain  Home Living      Prior Functioning/Environment              Frequency  Min 1X/week        Progress Toward Goals  OT Goals(current goals can now be found in the care plan section)  Progress towards OT goals: Progressing toward goals  Acute Rehab OT  Goals Patient Stated Goal: go home OT Goal Formulation: With patient Time For Goal Achievement: 04/04/23 Potential to Achieve Goals: Good  Plan      Co-evaluation                 AM-PAC OT "6 Clicks" Daily Activity     Outcome Measure   Help from another person eating meals?: None Help from another person taking care of personal grooming?: None Help from another person toileting, which includes using toliet, bedpan, or urinal?: A Lot Help from another person bathing (including washing, rinsing, drying)?: A Lot Help from another person to put on and taking off regular upper body clothing?: A Little Help from another person to put on and taking off regular lower body clothing?: A Lot 6 Click Score: 17    End of Session Equipment Utilized During Treatment: Rolling walker (2 wheels);Gait belt  OT Visit Diagnosis: Other abnormalities of gait and mobility (R26.89);Muscle weakness (generalized) (M62.81)   Activity Tolerance Patient tolerated treatment well   Patient Left in chair;with call bell/phone within reach;with chair alarm set   Nurse Communication Mobility status;Other (comment) (notified RN telemerty alarm going off)        Time: 4742-5956 OT Time Calculation (min): 15 min  Charges: OT General Charges $OT Visit: 1 Visit OT Treatments $Self Care/Home Management : 8-22 mins  [facility] MS, OTR/L ascom [phone]  03/23/23, 4:26 PM

## 2023-03-23 NOTE — Progress Notes (Signed)
Progress Note   Patient: Leslie Parker IEP:[id number] DOB: Jul 06, 1933 DOA: 03/20/2023     2 DOS: the patient was seen and examined on 03/23/2023   Brief hospital course:  Leslie Parker is a 89 y.o. female with medical history significant for congestive heart failure, hypertension, asthma, A-fib presenting with shortness of breath.  Patient recently had hip surgery and was discharged from rehab she is not on home oxygen and she is Coming in with hypoxia with O2 sats of 88% on room air patient's cough is clear what no reports of fevers or chills.  Patient states she lives alone but has been feeling weak.  No reports of chest pain palpitations.  Patient does report nausea but no vomiting.  Patient has been having swelling in both her legs. In the emergency room she is alert awake oriented afebrile is dyspneic and has auditory rales at bedside that we can hear without the stethoscope.  On oxygen currently at 2 L nasal cannula and is stable not in distress. Blood work in the emergency room is abnormal showing hyponatremia of 133, CKD stage IV with a creatinine at 1.94 and EGFR of 24.  LFTs are abnormal with ALT of 122 AST of 63 alk phos of 312, BNP of 224.5. CBC abnormal with a white count of 10.8 hemoglobin of 11 normal neutrophil count platelet counts are 271.  Respiratory panel is negative for influenza RSV and COVID, in the emergency room patient received 20 mg of Lasix IV. Chest x-ray done today shows retrocardiac strandy opacities like atelectasis.      Assessment and Plan:   Acute hypoxic respiratory failure Unclear etiology ??  Atelectasis, CAP. Chest x-ray shows Multifocal nodular opacity in the left lower lobe, possibly endobronchial. The largest nodule measures 11 mm. 62-month follow-up recommended to ensure resolution. Continue antibiotic therapy with Rocephin and Zithromax to complete a 5-day course of therapy.  Continue antitussives Patient had room air pulse oximetry of 88% and is  currently on 2 L of oxygen to maintain pulse oximetry greater than 92% Worked with physical therapy and noted to have a drop in her pulse oximetry to 88% with exertion but improved Incentive spirometry 10 x every hour while awake She will likely require home oxygen upon discharge       Acute on chronic diastolic dysfunction CHF Improved lower extremity swelling. 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 Continue consistent carbohydrate diet     COPD (chronic obstructive pulmonary disease) (HCC):  Stable As needed bronchodilators     Physical deconditioning Following recent reduction and internal fixation of displaced intertrochanteric right hip fracture Patient was recently discharged from rehab but is still extremely weak Patient PT and OT were important to recommend home health upon discharge     Sacral pressure injury (POA) Stage 3 Pressure Injury; right upper inner gluteal cleft  Pressure Injury POA: Yes Measurement:1.5cm x 1.0cm x 0.3cm  Wound bed:100% pink Drainage (amount, consistency, odor) none Periwound: epibole  Dressing procedure/placement/frequency: Cleanse with saline Apply silver hydrofiber  Top with foam Change every other day Will need chair pressure redistribution  pad upon discharge             Subjective: Patient is seen and examined at the bedside.  Continues to have a cough but improved  Physical Exam: Vitals:   03/22/23 2343 03/23/23 0604 03/23/23 0758 03/23/23 1150  BP: (!) 169/65 (!) 176/76 (!) 195/73 (!) 158/65  Pulse: 62 64 66 61  Resp: 18 16 16 17   Temp: 98 F (36.7  C) 98 F (36.7 C) 98.3 F (36.8 C) 98.1 F (36.7 C)  TempSrc: Oral     SpO2: 97% 97% 100% 98%  Weight:      Height:      General: She is comfortable and in no distress    Appearance: She is obese. She is ill-appearing.  HENT:     Head: Normocephalic and atraumatic.     Right Ear: Hearing and external ear normal.     Left Ear: Hearing and external ear normal.     Nose: Nose normal. No nasal deformity.     Mouth/Throat:     Lips: Pink.     Tongue: No lesions.  Eyes:     General: Lids are normal.     Extraocular Movements: Extraocular movements intact.     Pupils: Pupils are equal, round, and reactive to light.  Cardiovascular:     Rate and Rhythm: Normal rate and regular rhythm.  Bradycardic    Heart sounds: Normal heart sounds.  Pulmonary:     Effort: Normal.  Scattered rhonchi    Breath sounds: Rales at the bases Abdominal:     General: Bowel sounds are normal. There is no distension.     Palpations: Abdomen is soft. There is no mass.     Tenderness: There is no abdominal tenderness.  Musculoskeletal:     Right lower leg: Edema present.     Left lower leg: Edema present.  Skin:    General: Skin is warm.  Neurological:     General: No focal deficit present.     Mental Status: She is alert and oriented to person, place, and time.     Cranial Nerves: Cranial nerves 2-12 are intact.  Psychiatric:        Attention and Perception: Attention normal.        Mood and Affect: Mood normal.        Speech: Speech normal.        Behavior: Behavior normal. Behavior is cooperative.     Data Reviewed: Serum creatinine 1.6 There are no new results to review at this time.  Family Communication: Plan of care discussed with patient in detail  Disposition: Status is: Inpatient Remains inpatient appropriate because: Continue IV diuresis  Planned Discharge Destination: Home with Home Health    Time spent: 33 minutes  Author: Lucile Shutters, MD 03/23/2023 2:34 PM  For on  call review [email or web address] [facility]

## 2023-03-23 NOTE — Plan of Care (Signed)
  Problem: Education: Goal: Ability to demonstrate management of disease process will improve Outcome: Progressing Goal: Ability to verbalize understanding of medication therapies will improve Outcome: Progressing   Problem: Activity: Goal: Capacity to carry out activities will improve Outcome: Progressing   Problem: Cardiac: Goal: Ability to achieve and maintain adequate cardiopulmonary perfusion will improve Outcome: Progressing   Problem: Education: Goal: Ability to describe self-care measures that may prevent or decrease complications (Diabetes Survival Skills Education) will improve Outcome: Progressing   Problem: Coping: Goal: Ability to adjust to condition or change in health will improve Outcome: Progressing   Problem: Fluid Volume: Goal: Ability to maintain a balanced intake and output will improve Outcome: Progressing   Problem: Health Behavior/Discharge Planning: Goal: Ability to identify and utilize available resources and services will improve Outcome: Progressing Goal: Ability to manage health-related needs will improve Outcome: Progressing   Problem: Metabolic: Goal: Ability to maintain appropriate glucose levels will improve Outcome: Progressing   Problem: Nutritional: Goal: Maintenance of adequate nutrition will improve Outcome: Progressing Goal: Progress toward achieving an optimal weight will improve Outcome: Progressing   Problem: Skin Integrity: Goal: Risk for impaired skin integrity will decrease Outcome: Progressing   Problem: Tissue Perfusion: Goal: Adequacy of tissue perfusion will improve Outcome: Progressing   Problem: Education: Goal: Knowledge of General Education information will improve Description: Including pain rating scale, medication(s)/side effects and non-pharmacologic comfort measures Outcome: Progressing   Problem: Health Behavior/Discharge Planning: Goal: Ability to manage health-related needs will improve Outcome:  Progressing   Problem: Clinical Measurements: Goal: Ability to maintain clinical measurements within normal limits will improve Outcome: Progressing Goal: Will remain free from infection Outcome: Progressing Goal: Diagnostic test results will improve Outcome: Progressing Goal: Respiratory complications will improve Outcome: Progressing Goal: Cardiovascular complication will be avoided Outcome: Progressing   Problem: Activity: Goal: Risk for activity intolerance will decrease Outcome: Progressing   Problem: Nutrition: Goal: Adequate nutrition will be maintained Outcome: Progressing   Problem: Coping: Goal: Level of anxiety will decrease Outcome: Progressing   Problem: Elimination: Goal: Will not experience complications related to bowel motility Outcome: Progressing Goal: Will not experience complications related to urinary retention Outcome: Progressing   Problem: Pain Managment: Goal: General experience of comfort will improve Outcome: Progressing   Problem: Safety: Goal: Ability to remain free from injury will improve Outcome: Progressing   Problem: Skin Integrity: Goal: Risk for impaired skin integrity will decrease Outcome: Progressing

## 2023-03-24 DIAGNOSIS — R0602 Shortness of breath: Secondary | ICD-10-CM | POA: Diagnosis not present

## 2023-03-24 LAB — BASIC METABOLIC PANEL
Anion gap: 11 (ref 5–15)
BUN: 38 mg/dL — ABNORMAL HIGH (ref 8–23)
CO2: 24 mmol/L (ref 22–32)
Calcium: 8 mg/dL — ABNORMAL LOW (ref 8.9–10.3)
Chloride: 100 mmol/L (ref 98–111)
Creatinine, Ser: 1.46 mg/dL — ABNORMAL HIGH (ref 0.44–1.00)
GFR, Estimated: 34 mL/min — ABNORMAL LOW (ref >60)
Glucose, Bld: 123 mg/dL — ABNORMAL HIGH (ref 70–99)
Potassium: 3.6 mmol/L (ref 3.5–5.1)
Sodium: 135 mmol/L (ref 135–145)

## 2023-03-24 MED ORDER — MAGNESIUM SULFATE 2 GM/50ML IV SOLN
2.0000 g | Freq: Once | INTRAVENOUS | Status: AC
  Administered 2023-03-24: 2 g via INTRAVENOUS
  Filled 2023-03-24: qty 50

## 2023-03-24 MED ORDER — SODIUM CHLORIDE 0.9 % IV SOLN: INTRAVENOUS | Status: DC | PRN

## 2023-03-24 NOTE — Plan of Care (Signed)
  Problem: Coping: Goal: Ability to adjust to condition or change in health will improve Outcome: Progressing   Problem: Health Behavior/Discharge Planning: Goal: Ability to identify and utilize available resources and services will improve Outcome: Progressing Goal: Ability to manage health-related needs will improve Outcome: Progressing   Problem: Nutritional: Goal: Maintenance of adequate nutrition will improve Outcome: Progressing   Problem: Tissue Perfusion: Goal: Adequacy of tissue perfusion will improve Outcome: Progressing

## 2023-03-24 NOTE — Progress Notes (Addendum)
Progress Note   Patient: Leslie Parker:[id number] DOB: 1933/04/25 DOA: 03/20/2023     3 DOS: the patient was seen and examined on 03/24/2023   Brief hospital course:  Leslie Parker is a 89 y.o. female with medical history significant for congestive heart failure, hypertension, asthma, A-fib presenting with shortness of breath.  Patient recently had hip surgery and was discharged from rehab she is not on home oxygen and she is Coming in with hypoxia with O2 sats of 88% on room air patient's cough is clear what no reports of fevers or chills.  Patient states she lives alone but has been feeling weak.  No reports of chest pain palpitations.  Patient does report nausea but no vomiting.  Patient has been having swelling in both her legs. In the emergency room she is alert awake oriented afebrile is dyspneic and has auditory rales at bedside that we can hear without the stethoscope.  On oxygen currently at 2 L nasal cannula and is stable not in distress. Blood work in the emergency room is abnormal showing hyponatremia of 133, CKD stage IV with a creatinine at 1.94 and EGFR of 24.  LFTs are abnormal with ALT of 122 AST of 63 alk phos of 312, BNP of 224.5. CBC abnormal with a white count of 10.8 hemoglobin of 11 normal neutrophil count platelet counts are 271.  Respiratory panel is negative for influenza RSV and COVID, in the emergency room patient received 20 mg of Lasix IV. Chest x-ray done today shows retrocardiac strandy opacities like atelectasis.   Assessment and Plan:   Acute hypoxic respiratory failure Unclear etiology ??  Atelectasis, CAP. Chest x-ray showed multifocal nodular opacity in the left lower lobe, possibly endobronchial. The largest nodule measures 11 mm. 67-month follow-up recommended to ensure resolution. Continue antibiotic therapy with Rocephin and Zithromax to complete a 5-day course of therapy.  Continue antitussives Patient had room air pulse oximetry of 88% and is currently  on 2 L of oxygen to maintain pulse oximetry greater than 92% Worked with physical therapy and noted to have a drop in her pulse oximetry to 88% with exertion but improved Incentive spirometry 10 x every hour while awake She will likely require home oxygen upon discharge       Acute on chronic diastolic dysfunction CHF Improved lower extremity swelling. Continue Lasix 40 mg IV daily Continue metoprolol       Chronic a-fib (HCC): HR 67 Continue Amiodarone, diltiazem and metoprolol,  Continue eliquis as primary prophylaxis for an acute stroke         Essential hypertension Blood pressure stable on metoprolol and diltiazem       Type II diabetes mellitus with renal manifestations [facility]):  Patient has 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 Continue consistent carbohydrate diet     COPD (chronic obstructive pulmonary disease) (HCC):  Stable As needed bronchodilators     Physical deconditioning Following recent reduction and internal fixation of displaced intertrochanteric right hip fracture Patient was recently discharged from rehab but is still extremely weak Patient PT and OT were important to recommend home health upon discharge     Sacral pressure injury (POA) Stage 3 Pressure Injury; right upper inner gluteal cleft  Pressure Injury POA: Yes Measurement:1.5cm x 1.0cm x 0.3cm  Wound bed:100% pink Drainage (amount, consistency, odor) none Periwound: epibole  Dressing procedure/placement/frequency: Cleanse with saline Apply silver hydrofiber  Top with foam Change every other day Will need chair pressure redistribution pad upon  discharge    AKI Baseline serum creatinine is 1.7 and on admission it was 2.06 Renal function has improved with diuresis           Subjective: Patient is seen and examined at the bedside. No new complaints. Cough has improved.  Physical Exam: Vitals:   03/24/23 0416 03/24/23 0701 03/24/23 0738 03/24/23 1151  BP:  (!) 151/67  (!) 169/72 (!) 157/63  Pulse:   68 65  Resp: 20  20 20   Temp: 97.6 F (36.4 C)  98.2 F (36.8 C) 98.2 F (36.8 C)  TempSrc: Oral     SpO2: 99%  100% 99%  Weight:  85.6 kg    Height:       General: She is comfortable and in no distress    Appearance: She is obese. She is ill-appearing.  HENT:     Head: Normocephalic and atraumatic.     Right Ear: Hearing and external ear normal.     Left Ear: Hearing and external ear normal.     Nose: Nose normal. No nasal deformity.     Mouth/Throat:     Lips: Pink.     Tongue: No lesions.  Eyes:     General: Lids are normal.     Extraocular Movements: Extraocular movements intact.     Pupils: Pupils are equal, round, and reactive to light.  Cardiovascular:     Rate and Rhythm: Normal rate and regular rhythm.  Bradycardic    Heart sounds: Normal heart sounds.  Pulmonary:     Effort: Normal.  Scattered rhonchi    Breath sounds:  Abdominal:     General: Bowel sounds are normal. There is no distension.     Palpations: Abdomen is soft. There is no mass.     Tenderness: There is no abdominal tenderness.  Musculoskeletal:     Right lower leg: Edema present.     Left lower leg: Edema present.  Skin:    General: Skin is warm.  Neurological:     General: No focal deficit present.     Mental Status: She is alert and oriented to person, place, and time.     Cranial Nerves: Cranial nerves 2-12 are intact.  Psychiatric:        Attention and Perception: Attention normal.        Mood and Affect: Mood normal.        Speech: Speech normal.        Behavior: Behavior normal. Behavior is cooperative.    Data Reviewed: Labs reviewed. BUN 38, Creatinine 1.46 There are no new results to review at this time.  Family Communication: Plan of care discussed with patient and discharge is planned for am  Disposition: Status is: Inpatient Remains inpatient appropriate because: Remains on IV diuretics  Planned Discharge Destination: Home with  Home Health    Time spent: 30 minutes  Author: Lucile Shutters, MD 03/24/2023 1:33 PM  For on call review [email or web address].

## 2023-03-24 NOTE — TOC Progression Note (Signed)
Transition of Care [facility]) - Progression Note    Patient Details  Name: Leslie Parker MRN: [id number] Date of Birth: 1933/02/14  Transition of Care [facility]) CM/SW Contact  Susa Simmonds, [place] Phone Number: 03/24/2023, 11:26 AM  Clinical Narrative:   CSW spoke with patients granddaughter Arletta Bale. CSW notified Ms. Margo Aye that the provider plans on discharging her grandmother tomorrow. CSW also stated that home health was recommended and not a nursing home. Ms. Margo Aye stated she is out of town and would have to find someone to care for her grandmother until she returns. CSW asked Ms. Margo Aye to keep the hospital updated.     Expected Discharge Plan: Home/Self Care Barriers to Discharge: Continued Medical Work up  Expected Discharge Plan and Services       Living arrangements for the past 2 months: Single Family Home                                       Social Determinants of Health (SDOH) Interventions SDOH Screenings   Food Insecurity: No Food Insecurity (03/21/2023)  Housing: Low Risk  (03/21/2023)  Transportation Needs: No Transportation Needs (03/21/2023)  Utilities: Not At Risk (03/21/2023)  Tobacco Use: Low Risk  (03/20/2023)    Readmission Risk Interventions    03/21/2023    1:12 PM 08/27/2022   12:05 PM  Readmission Risk Prevention Plan  Transportation Screening Complete Complete  PCP or Specialist Appt within 3-5 Days Complete Complete  HRI or Home Care Consult Complete Complete  Social Work Consult for Recovery Care Planning/Counseling Complete   Palliative Care Screening Not Applicable   Medication Review Oceanographer) Complete Complete

## 2023-03-24 NOTE — Progress Notes (Signed)
Physical Therapy Treatment Patient Details Name: Leslie Parker MRN: [id number] DOB: 04/07/1933 Today's Date: 03/24/2023   History of Present Illness Pt is an 89 year old female presenting with hypoxia, admitted with CHF vs PNA     PMH significant for ongestive heart failure, hypertension, asthma, A-fib presenting with shortness of breath.  Patient recently had hip surgery (ORIF, 01/2023) and was discharged from rehab (approx two weeks prior to this admission).    PT Comments  Pt very pleasant and motivated with PT, ultimately made nice improvements with mobility and gait distance.  SpO2 in the high 90s on 2L on arrival, per pt request (and discussion with RN), removed to room air - pt maintained mid 90s (>93% the entire time) during functional tasks during session.  Pt with increased R hip per with increased standing time but otherwise did very nicely and again showed very good effort.  Will benefit from continued PT per POC.      If plan is discharge home, recommend the following: A little help with walking and/or transfers;A little help with bathing/dressing/bathroom   Can travel by private vehicle        Equipment Recommendations  None recommended by PT    Recommendations for Other Services       Precautions / Restrictions Precautions Precautions: Fall Restrictions Weight Bearing Restrictions: No     Mobility  Bed Mobility Overal bed mobility: Needs Assistance Bed Mobility: Supine to Sit     Supine to sit: Contact guard     General bed mobility comments: Pt needing self assist (UEs) to get R LE to EOB but with extra time/cuing and great effort was able to get to EOB and using rails to attain sitting w/o physical assist    Transfers Overall transfer level: Needs assistance Equipment used: Rolling walker (2 wheels) Transfers: Sit to/from Stand Sit to Stand: Contact guard assist Stand pivot transfers: Contact guard assist         General transfer comment: from standard  height bed pt was able to rise to standing X 3 with cuing for set up and sequencing.  Of note - significant empting of bladder (many 100s of CCs) on inital standing attempt requiring much clean up    Ambulation/Gait Ambulation/Gait assistance: Min assist Gait Distance (Feet): 20 Feet Assistive device: Rolling walker (2 wheels)         General Gait Details: Pt very motivated to walk into the hallway, specifically asked to do so w/o O2.  She was able to maintain SpO2 >93% on room air and overall did well.  R hip pain was the biggest factor on needing to sit, appropriate walker reliance withoug LOBs, close CGA/minA to insure safe walker use/positioning   Stairs             Wheelchair Mobility     Tilt Bed    Modified Rankin (Stroke Patients Only)       Balance Overall balance assessment: Needs assistance Sitting-balance support: No upper extremity supported, Feet supported Sitting balance-[name]: Good     Standing balance support: Bilateral upper extremity supported Standing balance-[name]: Fair Standing balance comment: reliant on walker, R hip pain increase with increased time in standing.                            Cognition Arousal: Alert Behavior During Therapy: WFL for tasks assessed/performed Overall Cognitive Status: Within Functional Limits for tasks assessed  General Comments: agreeable and participatory        Exercises      General Comments General comments (skin integrity, edema, etc.): Pt very pleasant, eager, motivated and ultimately making nice, consistent  functional gains      Pertinent Vitals/Pain Pain Assessment Pain Assessment:  (minimal R LE pain, reports much improve from prior dates)    Home Living                          Prior Function            PT Goals (current goals can now be found in the care plan section) Progress towards PT goals:  Progressing toward goals    Frequency    Min 1X/week      PT Plan      Co-evaluation              AM-PAC PT "6 Clicks" Mobility   Outcome Measure  Help needed turning from your back to your side while in a flat bed without using bedrails?: A Little Help needed moving from lying on your back to sitting on the side of a flat bed without using bedrails?: A Little Help needed moving to and from a bed to a chair (including a wheelchair)?: A Little Help needed standing up from a chair using your arms (e.g., wheelchair or bedside chair)?: A Little Help needed to walk in hospital room?: A Lot Help needed climbing 3-5 steps with a railing? : A Lot 6 Click Score: 16    End of Session Equipment Utilized During Treatment: Gait belt (per conversation with RN, trialed session w/o O2, pt maintained >93% with all activity) Activity Tolerance: Patient limited by pain;Patient limited by fatigue;Patient tolerated treatment well Patient left: in chair;with call bell/phone within reach;with chair alarm set Nurse Communication: Mobility status (O2 status) PT Visit Diagnosis: Muscle weakness (generalized) (M62.81);Difficulty in walking, not elsewhere classified (R26.2)     Time: 3086-5784 PT Time Calculation (min) (ACUTE ONLY): 29 min  Charges:    $Gait Training: 8-22 mins $Therapeutic Activity: 8-22 mins PT General Charges $$ ACUTE PT VISIT: 1 Visit                     Malachi Pro, DPT 03/24/2023, 5:37 PM

## 2023-03-25 ENCOUNTER — Inpatient Hospital Stay: Payer: Medicare HMO

## 2023-03-25 DIAGNOSIS — R0602 Shortness of breath: Secondary | ICD-10-CM | POA: Diagnosis not present

## 2023-03-25 LAB — BASIC METABOLIC PANEL
Anion gap: 11 (ref 5–15)
BUN: 35 mg/dL — ABNORMAL HIGH (ref 8–23)
CO2: 24 mmol/L (ref 22–32)
Calcium: 8.6 mg/dL — ABNORMAL LOW (ref 8.9–10.3)
Chloride: 100 mmol/L (ref 98–111)
Creatinine, Ser: 1.42 mg/dL — ABNORMAL HIGH (ref 0.44–1.00)
GFR, Estimated: 36 mL/min — ABNORMAL LOW (ref >60)
Glucose, Bld: 155 mg/dL — ABNORMAL HIGH (ref 70–99)
Potassium: 3.7 mmol/L (ref 3.5–5.1)
Sodium: 135 mmol/L (ref 135–145)

## 2023-03-25 MED ORDER — FUROSEMIDE 40 MG PO TABS
40.0000 mg | ORAL_TABLET | Freq: Every day | ORAL | Status: DC
  Administered 2023-03-26 – 2023-04-01 (×7): 40 mg via ORAL
  Filled 2023-03-25 (×7): qty 1

## 2023-03-25 MED ORDER — HYDRALAZINE HCL 25 MG PO TABS
25.0000 mg | ORAL_TABLET | Freq: Once | ORAL | Status: AC
  Administered 2023-03-25: 25 mg via ORAL
  Filled 2023-03-25: qty 1

## 2023-03-25 MED ORDER — HYDRALAZINE HCL 20 MG/ML IJ SOLN
5.0000 mg | Freq: Once | INTRAMUSCULAR | Status: AC
  Administered 2023-03-25: 5 mg via INTRAVENOUS
  Filled 2023-03-25: qty 1

## 2023-03-25 NOTE — TOC Progression Note (Addendum)
Transition of Care [facility]) - Progression Note    Patient Details  Name: Leslie Parker MRN: [id number] Date of Birth: 1933/06/07  Transition of Care [facility]) CM/SW Contact  Truddie Hidden, RN Phone Number: 03/25/2023, 11:01 AM  Clinical Narrative:    Spoke with patient's granddaughter. She stated she is out of town. She requested to follow back up with the patient regarding her discharge plan.   10:30am Spoke with patient at the bedside. She stated she does not have any one to assist her until her daughter comes to town 9/8. Per the patient her granddaughter that previously assisted her  has moved to [place]. Patient advised of right to appeal. She states she has concerns about coughing and swollen feet. MD notified.  11:00am Contacted [name] patient's granddaughter. She was advised patient is nearing medical readiness and would be able to appeal her discharge if she chooses to. [name] stated she would talk with the patient to see if one of her granddaughter's that lives in [place] is able to assist her.      Expected Discharge Plan: Home/Self Care Barriers to Discharge: Continued Medical Work up  Expected Discharge Plan and Services       Living arrangements for the past 2 months: Single Family Home                                       Social Determinants of Health (SDOH) Interventions SDOH Screenings   Food Insecurity: No Food Insecurity (03/21/2023)  Housing: Low Risk  (03/21/2023)  Transportation Needs: No Transportation Needs (03/21/2023)  Utilities: Not At Risk (03/21/2023)  Tobacco Use: Low Risk  (03/20/2023)    Readmission Risk Interventions    03/21/2023    1:12 PM 08/27/2022   12:05 PM  Readmission Risk Prevention Plan  Transportation Screening Complete Complete  PCP or Specialist Appt within 3-5 Days Complete Complete  HRI or Home Care Consult Complete Complete  Social Work Consult for Recovery Care Planning/Counseling Complete   Palliative Care  Screening Not Applicable   Medication Review Oceanographer) Complete Complete

## 2023-03-25 NOTE — Progress Notes (Signed)
Occupational Therapy Treatment Patient Details Name: Leslie Parker MRN: [id number] DOB: Mar 20, 1933 Today's Date: 03/25/2023   History of present illness Pt is an 89 year old female presenting with hypoxia, admitted with CHF vs PNA     PMH significant for ongestive heart failure, hypertension, asthma, A-fib presenting with shortness of breath.  Patient recently had hip surgery (ORIF, 01/2023) and was discharged from rehab (approx two weeks prior to this admission).   OT comments  Pt seen for OT tx. Pt agreeable and denies significant complaints. Pt required MIN A for RLE mgt with bed mobility, CGA for transfers, and completed mobility with RW requiring SBA-CGA ~50' with SpO2 remaining >97%. Pt limited by slight SOB and fatigue. Pt encouraged to utilize PLB and incorporate rest breaks and activity pacing. Pt verbalized understanding. Continues to benefit from skilled OT services and demonstrating good progress towards goals.       If plan is discharge home, recommend the following:  A little help with walking and/or transfers;A little help with bathing/dressing/bathroom;Direct supervision/assist for financial management;Assistance with cooking/housework;Direct supervision/assist for medications management;Assist for transportation;Help with stairs or ramp for entrance   Equipment Recommendations  None recommended by OT    Recommendations for Other Services      Precautions / Restrictions Precautions Precautions: Fall Restrictions Weight Bearing Restrictions: No       Mobility Bed Mobility Overal bed mobility: Needs Assistance Bed Mobility: Supine to Sit, Sit to Supine     Supine to sit: Min assist Sit to supine: Min assist   General bed mobility comments: MIN A for RLE mgt    Transfers Overall transfer level: Needs assistance Equipment used: Rolling walker (2 wheels) Transfers: Sit to/from Stand Sit to Stand: Contact guard assist                 Balance Overall balance  assessment: Needs assistance Sitting-balance support: No upper extremity supported, Feet supported Sitting balance-[name]: Good     Standing balance support: Bilateral upper extremity supported Standing balance-[name]: Fair                             ADL either performed or assessed with clinical judgement   ADL Overall ADL's : Needs assistance/impaired                                     Functional mobility during ADLs: Contact guard assist;Rolling walker (2 wheels)      Extremity/Trunk Assessment              Vision       Perception     Praxis      Cognition Arousal: Alert Behavior During Therapy: WFL for tasks assessed/performed Overall Cognitive Status: Within Functional Limits for tasks assessed                                          Exercises Other Exercises Other Exercises: Pt encouraged to utilize PLB and incorporate rest breaks wiht activity pacing    Shoulder Instructions       General Comments on room air throughout, SpO2 97% or better    Pertinent Vitals/ Pain       Pain Assessment Pain Assessment: No/denies pain  Home Living  Prior Functioning/Environment              Frequency  Min 1X/week        Progress Toward Goals  OT Goals(current goals can now be found in the care plan section)  Progress towards OT goals: Progressing toward goals  Acute Rehab OT Goals Patient Stated Goal: go home OT Goal Formulation: With patient Time For Goal Achievement: 04/04/23 Potential to Achieve Goals: Good  Plan      Co-evaluation                 AM-PAC OT "6 Clicks" Daily Activity     Outcome Measure   Help from another person eating meals?: None Help from another person taking care of personal grooming?: None Help from another person toileting, which includes using toliet, bedpan, or urinal?: A Little Help from  another person bathing (including washing, rinsing, drying)?: A Lot Help from another person to put on and taking off regular upper body clothing?: A Little Help from another person to put on and taking off regular lower body clothing?: A Lot 6 Click Score: 18    End of Session Equipment Utilized During Treatment: Rolling walker (2 wheels)  OT Visit Diagnosis: Other abnormalities of gait and mobility (R26.89);Muscle weakness (generalized) (M62.81)   Activity Tolerance Patient tolerated treatment well   Patient Left in bed;with call bell/phone within reach;with bed alarm set   Nurse Communication          Time: 1610-9604 OT Time Calculation (min): 14 min  Charges: OT General Charges $OT Visit: 1 Visit OT Treatments $Therapeutic Activity: 8-22 mins  Arman Filter., MPH, MS, OTR/L ascom [phone] 03/25/23, 5:12 PM

## 2023-03-25 NOTE — Progress Notes (Signed)
OT Cancellation Note  Patient Details Name: Leslie Parker MRN: [id number] DOB: 1933-03-24   Cancelled Treatment:    Reason Eval/Treat Not Completed: Patient declined, no reason specified. Pt declines despite encouragement, citing being worried about discharge plan and waiting for MD to return with update. Will re-attempt at later time as pt is able to participate.   Arman Filter., MPH, MS, OTR/L ascom [phone] 03/25/23, 11:12 AM

## 2023-03-25 NOTE — Care Management Important Message (Signed)
Important Message  Patient Details  Name: Leslie Parker MRN: [id number] Date of Birth: [birth year]   Medicare Important Message Given:  Yes     Truddie Hidden, RN 03/25/2023, 11:07 AM

## 2023-03-25 NOTE — Progress Notes (Signed)
Physical Therapy Treatment Patient Details Name: Leslie Parker MRN: [id number] DOB: [birth year] Today's Date: 03/25/2023   History of Present Illness Pt is an 89 year old female presenting with hypoxia, admitted with CHF vs PNA     PMH significant for ongestive heart failure, hypertension, asthma, A-fib presenting with shortness of breath.  Patient recently had hip surgery (ORIF, 01/2023) and was discharged from rehab (approx two weeks prior to this admission).    PT Comments  Completes all transfers with cga/close sup; increased time to complete, but able to complete with minimal assist from therapist.   Also with progressive increase in mobility (up to 45') with RW, cga/close sup; distance limited by general fatigue/SOB.  Sats >96% on RA throughout.  Improving confidence with transfers and mobility.    If plan is discharge home, recommend the following: A little help with walking and/or transfers;A little help with bathing/dressing/bathroom   Can travel by private vehicle        Equipment Recommendations       Recommendations for Other Services       Precautions / Restrictions Precautions Precautions: Fall Restrictions Weight Bearing Restrictions: No     Mobility  Bed Mobility Overal bed mobility: Needs Assistance Bed Mobility: Sit to Supine       Sit to supine: Min assist   General bed mobility comments: for R LE elevation into bed    Transfers Overall transfer level: Needs assistance Equipment used: Rolling walker (2 wheels) Transfers: Sit to/from Stand, Bed to chair/wheelchair/BSC Sit to Stand: Contact guard assist, Supervision Stand pivot transfers: Contact guard assist, Supervision         General transfer comment: sit/stand from various surfaces (BSC, recliner) with RW, cga/close sup.  Good hand placement, completing lift off and stabilization without physical assist from therapist    Ambulation/Gait Ambulation/Gait assistance: Contact guard assist,  Supervision Gait Distance (Feet): 45 Feet Assistive device: Rolling walker (2 wheels)         General Gait Details: forward flexed posture with partially reciprocal steppin pattern; limited step height/length, R > L. Mod SOB with distance (limiting overall tolerance), sats >96% on RA throughout   Stairs             Wheelchair Mobility     Tilt Bed    Modified Rankin (Stroke Patients Only)       Balance Overall balance assessment: Needs assistance Sitting-balance support: No upper extremity supported, Feet supported Sitting balance-[name]: Good     Standing balance support: Bilateral upper extremity supported Standing balance-[name]: Fair                              Cognition Arousal: Alert Behavior During Therapy: WFL for tasks assessed/performed Overall Cognitive Status: Within Functional Limits for tasks assessed                                          Exercises Other Exercises Other Exercises: Patient received on [facility] upon arrival to room; assisted with hygiene and transfer off of BSC. Mod assist from therapist to ensure cleanliness after continent bowel/bladder; sit/stand, standing balance and bed/chair transfer with RW, cga/close sup. Other Exercises: Sit/stand x3 with RW, cga/close sup    General Comments        Pertinent Vitals/Pain Pain Assessment Pain Assessment: Faces Faces Pain Scale: Hurts  little more Pain Location: R hip Pain Descriptors / Indicators: Aching, Grimacing, Sore Pain Intervention(s): Limited activity within patient's tolerance, Monitored during session, Repositioned    Home Living                          Prior Function            PT Goals (current goals can now be found in the care plan section) Acute Rehab PT Goals Patient Stated Goal: to get my strength back PT Goal Formulation: With patient Time For Goal Achievement: 04/04/23 Potential to Achieve Goals:  Good Progress towards PT goals: Progressing toward goals    Frequency    Min 1X/week      PT Plan      Co-evaluation              AM-PAC PT "6 Clicks" Mobility   Outcome Measure    Help needed moving from lying on your back to sitting on the side of a flat bed without using bedrails?: A Little Help needed moving to and from a bed to a chair (including a wheelchair)?: A Little Help needed standing up from a chair using your arms (e.g., wheelchair or bedside chair)?: A Little Help needed to walk in hospital room?: A Little Help needed climbing 3-5 steps with a railing? : A Lot 6 Click Score: 14    End of Session Equipment Utilized During Treatment: Gait belt Activity Tolerance: Patient tolerated treatment well Patient left: with call bell/phone within reach;in bed;with bed alarm set Nurse Communication: Mobility status PT Visit Diagnosis: Muscle weakness (generalized) (M62.81);Difficulty in walking, not elsewhere classified (R26.2)     Time: 1610-9604 PT Time Calculation (min) (ACUTE ONLY): 25 min  Charges:    $Gait Training: 8-22 mins $Therapeutic Activity: 8-22 mins PT General Charges $$ ACUTE PT VISIT: 1 Visit                    Doniqua Saxby H. Manson Passey, PT, DPT, NCS 03/25/23, 2:41 PM [phone]

## 2023-03-25 NOTE — Progress Notes (Signed)
Progress Note   Patient: Leslie Parker QMV:[id number] DOB: 1932-10-07 DOA: 03/20/2023     4 DOS: the patient was seen and examined on 03/25/2023   Brief hospital course:  Leslie Parker is a 89 y.o. female with medical history significant for congestive heart failure, hypertension, asthma, A-fib presenting with shortness of breath.  Patient recently had hip surgery and was discharged from rehab she is not on home oxygen and she is Coming in with hypoxia with O2 sats of 88% on room air patient's cough is clear what no reports of fevers or chills.  Patient states she lives alone but has been feeling weak.  No reports of chest pain palpitations.  Patient does report nausea but no vomiting.  Patient has been having swelling in both her legs. In the emergency room she is alert awake oriented afebrile is dyspneic and has auditory rales at bedside that we can hear without the stethoscope.  On oxygen currently at 2 L nasal cannula and is stable not in distress. Blood work in the emergency room is abnormal showing hyponatremia of 133, CKD stage IV with a creatinine at 1.94 and EGFR of 24.  LFTs are abnormal with ALT of 122 AST of 63 alk phos of 312, BNP of 224.5. CBC abnormal with a white count of 10.8 hemoglobin of 11 normal neutrophil count platelet counts are 271.  Respiratory panel is negative for influenza RSV and COVID, in the emergency room patient received 20 mg of Lasix IV. Chest x-ray done today shows retrocardiac strandy opacities like atelectasis.   Assessment and Plan:   Acute hypoxic respiratory failure Unclear etiology ??  Atelectasis, CAP. Chest x-ray showed multifocal nodular opacity in the left lower lobe, possibly endobronchial. The largest nodule measures 11 mm. 20-month follow-up recommended to ensure resolution. Patient has completed a 5-day course of IV Rocephin and Zithromax.  Continue antitussives Patient had room air pulse oximetry of 88% and is currently on 2 L of oxygen to maintain  pulse oximetry greater than 92% Worked with physical therapy and noted to have a drop in her pulse oximetry to 88% with exertion but improved Incentive spirometry 10 x every hour while awake She will likely require home oxygen upon discharge       Acute on chronic diastolic dysfunction CHF Improved lower extremity swelling. Will order teds Change furosemide to oral Continue metoprolol       Chronic a-fib (HCC): HR 67 Continue Amiodarone, diltiazem and metoprolol,  Continue eliquis as primary prophylaxis for an acute stroke         Essential hypertension Markedly elevated blood pressure most likely related to anxiety Blood pressure stable on metoprolol and diltiazem Will give a one-time dose of IV hydralazine.       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 Continue consistent carbohydrate diet     COPD (chronic obstructive pulmonary disease) (HCC):  Stable As needed bronchodilators     Physical deconditioning Following recent reduction and internal fixation of displaced intertrochanteric right hip fracture Patient was recently discharged from rehab but is still extremely weak Patient PT and OT were important to recommend home health upon discharge     Sacral pressure injury (POA) Stage 3 Pressure Injury; right upper inner gluteal cleft  Pressure Injury POA: Yes Measurement:1.5cm x 1.0cm x 0.3cm  Wound bed:100% pink Drainage (amount, consistency, odor) none Periwound: epibole  Dressing procedure/placement/frequency: Cleanse with saline Apply silver hydrofiber  Top with foam Change every other day Will need chair pressure redistribution pad upon discharge       AKI Baseline serum creatinine is 1.7 and on admission it was 2.06 Renal function has improved with diuresis Change furosemide to oral               Subjective: Patient is seen and examined at the bedside.   Worried about her blood pressure being elevated as well as lower extremity swelling.  Physical Exam: Vitals:   03/25/23 0736 03/25/23 1114 03/25/23 1200 03/25/23 1302  BP: (!) 170/59 (!) 180/82 (!) 185/105 135/63  Pulse: 66 70  60  Resp: 16 16    Temp: 98.3 F (36.8 C) 97.7 F (36.5 C)    TempSrc: Oral Oral    SpO2: 97% 95%    Weight:      Height:       General: She is comfortable and in no distress    Appearance: She is obese. She is ill-appearing.  HENT:     Head: Normocephalic and atraumatic.     Right Ear: Hearing and external ear normal.     Left Ear: Hearing and external ear normal.     Nose: Nose normal. No nasal deformity.     Mouth/Throat:     Lips: Pink.     Tongue: No lesions.  Eyes:     General: Lids are normal.     Extraocular Movements: Extraocular movements intact.     Pupils: Pupils are equal, round, and reactive to light.  Cardiovascular:     Rate and Rhythm: Normal rate and regular rhythm.  Bradycardic    Heart sounds: Normal heart sounds.  Pulmonary:     Effort: Normal.  Scattered rhonchi    Breath sounds:  Abdominal:     General: Bowel sounds are normal. There is no distension.     Palpations: Abdomen is soft. There is no mass.     Tenderness: There is no abdominal tenderness.  Musculoskeletal:     Right lower leg: Edema present.     Left lower leg: Edema present.  Skin:    General: Skin is warm.  Neurological:     General: No focal deficit present.     Mental Status: She is alert and oriented to person, place, and time.     Cranial Nerves: Cranial nerves 2-12 are intact.  Psychiatric:        Attention and Perception: Attention normal.        Mood and Affect: Mood normal.        Speech: Speech normal.        Behavior: Behavior normal. Behavior is cooperative.        Data Reviewed: Labs reviewed.  Creatinine 1.42, BUN 35 There are no new results to review at this time.  Family Communication: Discussed discharge plans with patient who  states that her daughter who is coming to stay with her will not be available till the eighth.  She states that she has no one to come pick her up and is requesting to stay in the hospital until her daughter arrives.  TOC consult placed  Disposition: Status is: Inpatient  Remains inpatient appropriate because: For safe discharge home with home health  Planned Discharge Destination: Home with Home Health    Time spent: 30 minutes  Author: Lucile Shutters, MD 03/25/2023 1:45 PM  For on call review [email or web address].

## 2023-03-26 DIAGNOSIS — R0602 Shortness of breath: Secondary | ICD-10-CM | POA: Diagnosis not present

## 2023-03-26 NOTE — Consult Note (Signed)
   [facility] CM Inpatient Consult   03/26/2023  MAHALIA BRESLER [birth year] [id number]  Primary Care Provider:  Dr. Nemiah Commander Gavin Potters)  Patient is currently active with Care Management for chronic disease management services.  Patient has been engaged by a Charity fundraiser . Our community based plan of care has focused on disease management and community resource support.   Patient will receive a post hospital call and will be evaluated for assessments and disease process education.   Plan: Current disposition pt will discharge home in the care of her family.  Inpatient Transition Of Care [TOC] team member to make aware that Care Management following.  Of note, Care Management services does not replace or interfere with any services that are needed or arranged by inpatient [facility] care management team.   For additional questions or referrals please contact:  Elliot Cousin, RN, [facility] Liaison [place]   Population Health Office Hours MTWF  8:00 am-6:00 pm [phone] mobile [phone] [Office toll free line] Office Hours are M-F 8:30 - 5 pm [email or web address]

## 2023-03-26 NOTE — Progress Notes (Signed)
Occupational Therapy Treatment Patient Details Name: Leslie Parker MRN: [id number] DOB: [birth year] Today's Date: 03/26/2023   History of present illness Pt is an 89 year old female presenting with hypoxia, admitted with CHF vs PNA     PMH significant for ongestive heart failure, hypertension, asthma, A-fib presenting with shortness of breath.  Patient recently had hip surgery (ORIF, 01/2023) and was discharged from rehab (approx two weeks prior to this admission).   OT comments  Pt seen for OT tx. Pt agreeable and denies complaints. Pt completed sup>sit EOB with supervision and increased effort. Once EOB, pt required CGA to stand with RW. Upon standing, pt was incontinent of large amount of urine. Returned to sitting so OT could clean floor. Pt able to complete LB bathing from seated position requiring MIN A to complete. MAX A for doffing/donning socks (pt reports she has a sock aide she uses at home). Pt required MIN A for initiating doffing/donning undergarments over feet. In standing pt required CGA and VC for alternating UE support on the RW while completing donning of new undergarments over hips to prevent LOB. Pt in recliner at end of session requiring rest break prior to additional mobility attempts. Pt demonstrating progress towards goals, continues to benefit from skilled OT services.       If plan is discharge home, recommend the following:  A little help with walking and/or transfers;A little help with bathing/dressing/bathroom;Direct supervision/assist for financial management;Assistance with cooking/housework;Direct supervision/assist for medications management;Assist for transportation;Help with stairs or ramp for entrance   Equipment Recommendations  None recommended by OT    Recommendations for Other Services      Precautions / Restrictions Precautions Precautions: Fall Restrictions Weight Bearing Restrictions: No       Mobility Bed Mobility Overal bed mobility: Needs  Assistance Bed Mobility: Supine to Sit     Supine to sit: Supervision          Transfers Overall transfer level: Needs assistance Equipment used: Rolling walker (2 wheels) Transfers: Sit to/from Stand Sit to Stand: Contact guard assist                 Balance Overall balance assessment: Needs assistance Sitting-balance support: No upper extremity supported, Feet supported Sitting balance-[name]: Good     Standing balance support: Single extremity supported, During functional activity Standing balance-[name]: Fair Standing balance comment: alternating UE support on RW during LB dressing task                           ADL either performed or assessed with clinical judgement   ADL Overall ADL's : Needs assistance/impaired             Lower Body Bathing: Sitting/lateral leans;Minimal assistance Lower Body Bathing Details (indicate cue type and reason): MIN A for washing feet     Lower Body Dressing: Sit to/from stand;Minimal assistance Lower Body Dressing Details (indicate cue type and reason): MIN A to doff/don undergarments, CGA in standing with VC for pt to alternate UE support on RW while completing donning over hips to minimize LOB                    Extremity/Trunk Assessment              Vision       Perception     Praxis      Cognition Arousal: Alert Behavior During Therapy: [facility] for tasks assessed/performed Overall Cognitive  Status: Within Functional Limits for tasks assessed                                          Exercises      Shoulder Instructions       General Comments      Pertinent Vitals/ Pain       Pain Assessment Pain Assessment: No/denies pain  Home Living                                          Prior Functioning/Environment              Frequency  Min 1X/week        Progress Toward Goals  OT Goals(current goals can now be found in the  care plan section)  Progress towards OT goals: Progressing toward goals  Acute Rehab OT Goals Patient Stated Goal: go home OT Goal Formulation: With patient Time For Goal Achievement: 04/04/23 Potential to Achieve Goals: Good  Plan      Co-evaluation                 AM-PAC OT "6 Clicks" Daily Activity     Outcome Measure   Help from another person eating meals?: None Help from another person taking care of personal grooming?: None Help from another person toileting, which includes using toliet, bedpan, or urinal?: A Little Help from another person bathing (including washing, rinsing, drying)?: A Little Help from another person to put on and taking off regular upper body clothing?: A Little Help from another person to put on and taking off regular lower body clothing?: A Little 6 Click Score: 20    End of Session Equipment Utilized During Treatment: Rolling walker (2 wheels)  OT Visit Diagnosis: Other abnormalities of gait and mobility (R26.89);Muscle weakness (generalized) (M62.81)   Activity Tolerance Patient tolerated treatment well   Patient Left in chair;with call bell/phone within reach;with chair alarm set;with nursing/sitter in room   Nurse Communication          Time: 2841-3244 OT Time Calculation (min): 21 min  Charges: OT General Charges $OT Visit: 1 Visit OT Treatments $Self Care/Home Management : 8-22 mins  Arman Filter., MPH, MS, OTR/L ascom [phone] 03/26/23, 4:56 PM

## 2023-03-26 NOTE — Plan of Care (Signed)
  Problem: Education: Goal: Ability to demonstrate management of disease process will improve Outcome: Progressing Goal: Ability to verbalize understanding of medication therapies will improve Outcome: Progressing   Problem: Activity: Goal: Capacity to carry out activities will improve Outcome: Progressing   Problem: Cardiac: Goal: Ability to achieve and maintain adequate cardiopulmonary perfusion will improve Outcome: Progressing   Problem: Education: Goal: Ability to describe self-care measures that may prevent or decrease complications (Diabetes Survival Skills Education) will improve Outcome: Progressing   Problem: Coping: Goal: Ability to adjust to condition or change in health will improve Outcome: Progressing   Problem: Fluid Volume: Goal: Ability to maintain a balanced intake and output will improve Outcome: Progressing   Problem: Health Behavior/Discharge Planning: Goal: Ability to identify and utilize available resources and services will improve Outcome: Progressing Goal: Ability to manage health-related needs will improve Outcome: Progressing   Problem: Metabolic: Goal: Ability to maintain appropriate glucose levels will improve Outcome: Progressing   Problem: Nutritional: Goal: Maintenance of adequate nutrition will improve Outcome: Progressing Goal: Progress toward achieving an optimal weight will improve Outcome: Progressing   Problem: Skin Integrity: Goal: Risk for impaired skin integrity will decrease Outcome: Progressing   Problem: Tissue Perfusion: Goal: Adequacy of tissue perfusion will improve Outcome: Progressing   Problem: Education: Goal: Knowledge of General Education information will improve Description: Including pain rating scale, medication(s)/side effects and non-pharmacologic comfort measures Outcome: Progressing   Problem: Health Behavior/Discharge Planning: Goal: Ability to manage health-related needs will improve Outcome:  Progressing   Problem: Clinical Measurements: Goal: Ability to maintain clinical measurements within normal limits will improve Outcome: Progressing Goal: Will remain free from infection Outcome: Progressing Goal: Diagnostic test results will improve Outcome: Progressing Goal: Respiratory complications will improve Outcome: Progressing Goal: Cardiovascular complication will be avoided Outcome: Progressing   Problem: Activity: Goal: Risk for activity intolerance will decrease Outcome: Progressing   Problem: Nutrition: Goal: Adequate nutrition will be maintained Outcome: Progressing   Problem: Coping: Goal: Level of anxiety will decrease Outcome: Progressing   Problem: Elimination: Goal: Will not experience complications related to bowel motility Outcome: Progressing Goal: Will not experience complications related to urinary retention Outcome: Progressing   Problem: Pain Managment: Goal: General experience of comfort will improve Outcome: Progressing   Problem: Safety: Goal: Ability to remain free from injury will improve Outcome: Progressing   Problem: Skin Integrity: Goal: Risk for impaired skin integrity will decrease Outcome: Progressing

## 2023-03-26 NOTE — Progress Notes (Signed)
Mobility Specialist - Progress Note   03/26/23 1600  Mobility  Activity Ambulated with assistance in hallway  Level of Assistance Standby assist, set-up cues, supervision of patient - no hands on  Assistive Device Front wheel walker  Distance Ambulated (ft) 45 ft  Activity Response Tolerated well  $Mobility charge 1 Mobility     During mobility: 90 HR, 92% SpO2 Post-mobility: 70 HR, 95% SpO2   Pt sitting in recliner upon arrival, utilizing RA. Pt agreeable to activity. Completed STS from recliner and ambulation with minG. Mild SOB with exertion, O2 maintained >/= 92%. 1 mild LOB at end of ambulation, pt states she slipped on non-slip socks, corrected by CGA. Pt returned to recliner with alarm set, needs in reach.    Filiberto Pinks Mobility Specialist 03/26/23, 4:02 PM

## 2023-03-26 NOTE — Progress Notes (Signed)
Progress Note   Patient: Leslie Parker:[id number] DOB: [birth year] DOA: 03/20/2023     5 DOS: the patient was seen and examined on 03/26/2023   Brief hospital course:  Leslie Parker is a 89 y.o. female with medical history significant for congestive heart failure, hypertension, asthma, A-fib presenting with shortness of breath.  Patient recently had hip surgery and was discharged from rehab she is not on home oxygen and she is Coming in with hypoxia with O2 sats of 88% on room air patient's cough is clear what no reports of fevers or chills.  Patient states she lives alone but has been feeling weak.  No reports of chest pain palpitations.  Patient does report nausea but no vomiting.  Patient has been having swelling in both her legs. In the emergency room she is alert awake oriented afebrile is dyspneic and has auditory rales at bedside that we can hear without the stethoscope.  On oxygen currently at 2 L nasal cannula and is stable not in distress. Blood work in the emergency room is abnormal showing hyponatremia of 133, CKD stage IV with a creatinine at 1.94 and EGFR of 24.  LFTs are abnormal with ALT of 122 AST of 63 alk phos of 312, BNP of 224.5. CBC abnormal with a white count of 10.8 hemoglobin of 11 normal neutrophil count platelet counts are 271.  Respiratory panel is negative for influenza RSV and COVID, in the emergency room patient received 20 mg of Lasix IV. Chest x-ray done today shows retrocardiac strandy opacities like atelectasis.   Assessment and Plan:  Acute hypoxic respiratory failure Unclear etiology ??  Atelectasis, CAP. Chest x-ray showed multifocal nodular opacity in the left lower lobe, possibly endobronchial. The largest nodule measures 11 mm. 5-month follow-up recommended to ensure resolution. Patient has completed a 5-day course of IV Rocephin and Zithromax.  Continue antitussives Patient had room air pulse oximetry of 88% and is currently on 2 L of oxygen to maintain  pulse oximetry greater than 92% Worked with physical therapy and noted to have a drop in her pulse oximetry to 88% with exertion but improved Incentive spirometry 10 x every hour while awake She will likely require home oxygen upon discharge.  Ambulatory pulse ox check       Acute on chronic diastolic dysfunction CHF Improved lower extremity swelling. 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 Continue consistent carbohydrate diet Continue sliding scale insulin     COPD (chronic obstructive pulmonary disease) (HCC):  Stable As needed bronchodilators     Physical deconditioning Following recent reduction and internal fixation of displaced intertrochanteric right hip fracture Patient was recently discharged from rehab but is still extremely weak Patient PT and OT were important to recommend home health upon discharge     Sacral pressure injury (POA) Stage 3 Pressure Injury; right upper inner gluteal cleft  Pressure Injury POA: Yes Measurement:1.5cm x 1.0cm x 0.3cm  Wound bed:100% pink Drainage (amount, consistency, odor) none Periwound: epibole  Dressing procedure/placement/frequency: Cleanse with saline Apply silver hydrofiber  Top with foam Change  every other day Will need chair pressure redistribution pad upon discharge       AKI Baseline serum creatinine is 1.7 and on admission it was 2.06 Renal function has improved with diuresis Change furosemide to oral                 Subjective: Patient is seen and examined at the bedside.  Lives alone and states  that her daughter is coming to stay with her but will not be here until 8 September.  Concerned that she has no way of getting home and no one to help her if she is discharged.  Physical Exam: Vitals:   03/26/23 0438 03/26/23 0849 03/26/23 0930 03/26/23 1146  BP: 127/68 (!) 141/67  (!) 145/79  Pulse: 70 70 69 74  Resp: 18 16  20   Temp: (!) 97.5 F (36.4 C) 98.3 F (36.8 C)  98.1 F (36.7 C)  TempSrc:  Oral  Oral  SpO2: 97% 97%    Weight:      Height:       General: She is comfortable and in no distress    Appearance: She is obese. She is ill-appearing.  HENT:     Head: Normocephalic and atraumatic.     Right Ear: Hearing and external ear normal.     Left Ear: Hearing and external ear normal.     Nose: Nose normal. No nasal deformity.     Mouth/Throat:     Lips: Pink.     Tongue: No lesions.  Eyes:     General: Lids are normal.     Extraocular Movements: Extraocular movements intact.     Pupils: Pupils are equal, round, and reactive to light.  Cardiovascular:     Rate and Rhythm: Normal rate and regular rhythm.  Bradycardic    Heart sounds: Normal heart sounds.  Pulmonary:     Effort: Normal.  Scattered rhonchi    Breath sounds:  Abdominal:     General: Bowel sounds are normal. There is no distension.     Palpations: Abdomen is soft. There is no mass.     Tenderness: There is no abdominal tenderness.  Musculoskeletal:     Right lower leg: Edema present. (Improved)    Left lower leg: Edema present.  (Improved) Skin:    General: Skin is warm.  Neurological:     General: No focal deficit present.     Mental Status: She is alert and oriented to person, place, and time.     Cranial Nerves: Cranial nerves 2-12 are intact.  Psychiatric:        Attention and Perception: Attention normal.        Mood and Affect: Mood normal.        Speech: Speech normal.        Behavior: Behavior normal. Behavior is cooperative.      Data Reviewed: Labs reviewed. Chest x-ray reviewed  by me shows low lung volumes and a small left effusion. There are no new results to review at this time.  Family Communication: Plan of care discussed with patient  Disposition: Status is: Inpatient Remains inpatient appropriate because: Awaiting discharge  Planned Discharge Destination: Home with Home Health    Time spent: 30 minutes  Author: Lucile Shutters, MD 03/26/2023 2:04 PM  For on call review [email or web address].

## 2023-03-27 DIAGNOSIS — I5033 Acute on chronic diastolic (congestive) heart failure: Secondary | ICD-10-CM

## 2023-03-27 LAB — GLUCOSE, CAPILLARY
Glucose-Capillary: 111 mg/dL — ABNORMAL HIGH (ref 70–99)
Glucose-Capillary: 131 mg/dL — ABNORMAL HIGH (ref 70–99)
Glucose-Capillary: 173 mg/dL — ABNORMAL HIGH (ref 70–99)
Glucose-Capillary: 121 mg/dL — ABNORMAL HIGH (ref 70–99)

## 2023-03-27 MED ORDER — INSULIN ASPART 100 UNIT/ML IJ SOLN
0.0000 [IU] | Freq: Three times a day (TID) | INTRAMUSCULAR | Status: DC
  Administered 2023-03-27 – 2023-03-29 (×3): 3 [IU] via SUBCUTANEOUS
  Administered 2023-03-29: 2 [IU] via SUBCUTANEOUS
  Administered 2023-03-29 – 2023-03-30 (×2): 3 [IU] via SUBCUTANEOUS
  Administered 2023-03-30 (×3): 2 [IU] via SUBCUTANEOUS
  Administered 2023-03-31: 3 [IU] via SUBCUTANEOUS
  Administered 2023-03-31: 5 [IU] via SUBCUTANEOUS
  Administered 2023-04-01: 2 [IU] via SUBCUTANEOUS
  Filled 2023-03-27 (×12): qty 1

## 2023-03-27 NOTE — Plan of Care (Signed)
  Problem: Fluid Volume: Goal: Ability to maintain a balanced intake and output will improve Outcome: Progressing   Problem: Skin Integrity: Goal: Risk for impaired skin integrity will decrease Outcome: Progressing   Problem: Activity: Goal: Risk for activity intolerance will decrease Outcome: Progressing   Problem: Safety: Goal: Ability to remain free from injury will improve Outcome: Progressing

## 2023-03-27 NOTE — TOC Progression Note (Signed)
Transition of Care [facility]) - Progression Note    Patient Details  Name: [name] MRN: [id number] Date of Birth: 08-Aug-1932  Transition of Care [facility]) CM/SW Contact  Truddie Hidden, RN Phone Number: 03/27/2023, 10:11 AM  Clinical Narrative:    Spoke with patient's granddaughter. She stated she is unable to stay with the patient at discharge and had not found anyone to stay with her. She stated she attempted to speak with the patient and was told by her not to worry about it.    Expected Discharge Plan: Home/Self Care Barriers to Discharge: Continued Medical Work up  Expected Discharge Plan and Services       Living arrangements for the past 2 months: Single Family Home                                       Social Determinants of Health (SDOH) Interventions SDOH Screenings   Food Insecurity: No Food Insecurity (03/21/2023)  Housing: Low Risk  (03/21/2023)  Transportation Needs: No Transportation Needs (03/21/2023)  Utilities: Not At Risk (03/21/2023)  Tobacco Use: Low Risk  (03/20/2023)    Readmission Risk Interventions    03/21/2023    1:12 PM 08/27/2022   12:05 PM  Readmission Risk Prevention Plan  Transportation Screening Complete Complete  PCP or Specialist Appt within 3-5 Days Complete Complete  HRI or Home Care Consult Complete Complete  Social Work Consult for Recovery Care Planning/Counseling Complete   Palliative Care Screening Not Applicable   Medication Review Oceanographer) Complete Complete

## 2023-03-27 NOTE — Progress Notes (Signed)
PROGRESS NOTE    Leslie Parker  HYQ:[id number] DOB: Aug 19, 1932 DOA: 03/20/2023 PCP: [name], MD  Assessment & Plan:   Principal Problem:   Shortness of breath Active Problems:   Acute on chronic diastolic CHF (congestive heart failure) (HCC)   Essential hypertension   COPD (chronic obstructive pulmonary disease) (HCC)   Hypertension   Asthma, chronic   Hypomagnesemia   Diabetes mellitus without complication (HCC)   CKD stage 3 due to type 2 diabetes mellitus (HCC)   Paroxysmal atrial fibrillation (HCC)   Abnormal LFTs   Anemia  Assessment and Plan: Acute hypoxic respiratory failure: etiology unclear. Supplemental oxygen has since been weaned off. Saturating in mid 90s on RA  Possible pneumonia: completed 5 day course of rocephin, azithromycin. Continue bronchodilators & encourage incentive spirometry. CXR showed multifocal nodular opacity in the left lower lobe, possibly endobronchial. The largest nodule measures 11 mm. 44-month follow-up recommended to ensure resolution.   Acute on chronic diastolic CHF: continue on lasix, metoprolol. Monitor I/Os  Chronic a. fib: continue on amio, metoprolol, diltiazem & eliquis    HTN: continue on diltiazem, metoprolol, amio   DM2: well controlled, HbA1c 5.8. Carb modified diet    COPD: w/o exacerbation. Bronchodilators prn   Physical deconditioning: PT/OT recs home health  Sacral pressure injury: present on admission. Continue w/ wound care   AKI: Cr is labile. Avoid nephrotoxic meds. Baseline Cr 1.7.       DVT prophylaxis: eliquis Code Status: full  Family Communication:  Disposition Plan: likely d/c back home w/ family care    Level of care: Progressive Status is: Inpatient Remains inpatient appropriate because: medically stable but no one at home can care for pt currently. Pt's daughter comes on 03/31/23 as per pt and pt will live w/ daughter then     Consultants:    Procedures:   Antimicrobials:    Subjective: Pt c/o intermittent cough   Objective: Vitals:   03/26/23 2343 03/27/23 0457 03/27/23 0802 03/27/23 1231  BP: (!) 168/73 (!) 154/60 (!) 175/74 (!) 161/63  Pulse: 63 60 (!) 59 61  Resp: 18 20 18 18   Temp: 99.5 F (37.5 C) 98.4 F (36.9 C) 98.3 F (36.8 C) 98.5 F (36.9 C)  TempSrc: Oral Oral    SpO2: 95% 96% 96% 96%  Weight:      Height:        Intake/Output Summary (Last 24 hours) at 03/27/2023 1604 Last data filed at 03/27/2023 0810 Gross per 24 hour  Intake 240 ml  Output 800 ml  Net -560 ml   Filed Weights   03/20/23 1730 03/21/23 0641 03/24/23 0701  Weight: 81 kg 90.4 kg 85.6 kg    Examination:  General exam: Appears calm and comfortable  Respiratory system: diminished breath sounds b/l  Cardiovascular system: S1 & S2+. No rubs, gallops or clicks. Gastrointestinal system: Abdomen is nondistended, soft and nontender.  Normal bowel sounds heard. Central nervous system: Alert and oriented. Moves all extremities  Psychiatry: Judgement and insight appear normal. Mood & affect appropriate.     Data Reviewed: I have personally reviewed following labs and imaging studies  CBC: Recent Labs  Lab 03/20/23 1732 03/21/23 0524  WBC 10.8* 7.7  NEUTROABS 8.2*  --   HGB 11.0* 9.7*  HCT 34.6* 30.0*  MCV 98.3 95.2  PLT 271 222   Basic Metabolic Panel: Recent Labs  Lab 03/20/23 1732 03/21/23 0524 03/22/23 0421 03/23/23 0953 03/24/23 0908 03/25/23 0959  NA 133* 133* 136  135 135 135  K 4.1 3.6 3.2* 4.0 3.6 3.7  CL 102 104 100 102 100 100  CO2 20* 23 24 22 24 24   GLUCOSE 188* 185* 103* 143* 123* 155*  BUN 34* 35* 39* 38* 38* 35*  CREATININE 1.94* 2.06* 1.83* 1.66* 1.46* 1.42*  CALCIUM 8.2* 7.6* 8.5* 8.0* 8.0* 8.6*  MG 1.6*  --   --  1.7  --   --    GFR: Estimated Creatinine Clearance: 32 mL/min (A) (by C-G formula based on SCr of 1.42 mg/dL (H)). Liver Function Tests: Recent Labs  Lab 03/20/23 1732 03/21/23 0524  AST 63* 40  ALT 122* 94*   ALKPHOS 312* 274*  BILITOT 1.1 0.8  PROT 5.9* 5.1*  ALBUMIN 2.9* 2.5*   No results for input(s): "LIPASE", "AMYLASE" in the last 168 hours. No results for input(s): "AMMONIA" in the last 168 hours. Coagulation Profile: No results for input(s): "INR", "PROTIME" in the last 168 hours. Cardiac Enzymes: No results for input(s): "CKTOTAL", "CKMB", "CKMBINDEX", "TROPONINI" in the last 168 hours. BNP (last 3 results) No results for input(s): "PROBNP" in the last 8760 hours. HbA1C: No results for input(s): "HGBA1C" in the last 72 hours. CBG: Recent Labs  Lab 03/21/23 0753 03/21/23 1145 03/21/23 1529 03/27/23 0804 03/27/23 1232  GLUCAP 150* 148* 182* 111* 131*   Lipid Profile: No results for input(s): "CHOL", "HDL", "LDLCALC", "TRIG", "CHOLHDL", "LDLDIRECT" in the last 72 hours. Thyroid Function Tests: No results for input(s): "TSH", "T4TOTAL", "FREET4", "T3FREE", "THYROIDAB" in the last 72 hours. Anemia Panel: No results for input(s): "VITAMINB12", "FOLATE", "FERRITIN", "TIBC", "IRON", "RETICCTPCT" in the last 72 hours. Sepsis Labs: Recent Labs  Lab 03/20/23 1732  PROCALCITON 0.12    Recent Results (from the past 240 hour(s))  Resp panel by RT-PCR (RSV, Flu A&B, Covid) Anterior Nasal Swab     Status: None   Collection Time: 03/20/23  8:26 PM   Specimen: Anterior Nasal Swab  Result Value Ref Range Status   SARS Coronavirus 2 by RT PCR NEGATIVE NEGATIVE Final    Comment: (NOTE) SARS-CoV-2 target nucleic acids are NOT DETECTED.  The SARS-CoV-2 RNA is generally detectable in upper respiratory specimens during the acute phase of infection. The lowest concentration of SARS-CoV-2 viral copies this assay can detect is 138 copies/mL. A negative result does not preclude SARS-Cov-2 infection and should not be used as the sole basis for treatment or other patient management decisions. A negative result may occur with  improper specimen collection/handling, submission of specimen  other than nasopharyngeal swab, presence of viral mutation(s) within the areas targeted by this assay, and inadequate number of viral copies(<138 copies/mL). A negative result must be combined with clinical observations, patient history, and epidemiological information. The expected result is Negative.  Fact Sheet for Patients:  BloggerCourse.com  Fact Sheet for Healthcare Providers:  SeriousBroker.it  This test is no t yet approved or cleared by the [place] FDA and  has been authorized for detection and/or diagnosis of SARS-CoV-2 by FDA under an Emergency Use Authorization (EUA). This EUA will remain  in effect (meaning this test can be used) for the duration of the COVID-19 declaration under Section 564(b)(1) of the Act, 21 U.S.C.section 360bbb-3(b)(1), unless the authorization is terminated  or revoked sooner.       Influenza A by PCR NEGATIVE NEGATIVE Final   Influenza B by PCR NEGATIVE NEGATIVE Final    Comment: (NOTE) The Xpert Xpress SARS-CoV-2/FLU/RSV plus assay is intended as an aid in the  diagnosis of influenza from Nasopharyngeal swab specimens and should not be used as a sole basis for treatment. Nasal washings and aspirates are unacceptable for Xpert Xpress SARS-CoV-2/FLU/RSV testing.  Fact Sheet for Patients: BloggerCourse.com  Fact Sheet for Healthcare Providers: SeriousBroker.it  This test is not yet approved or cleared by the [place] FDA and has been authorized for detection and/or diagnosis of SARS-CoV-2 by FDA under an Emergency Use Authorization (EUA). This EUA will remain in effect (meaning this test can be used) for the duration of the COVID-19 declaration under Section 564(b)(1) of the Act, 21 U.S.C. section 360bbb-3(b)(1), unless the authorization is terminated or revoked.     Resp Syncytial Virus by PCR NEGATIVE NEGATIVE Final     Comment: (NOTE) Fact Sheet for Patients: BloggerCourse.com  Fact Sheet for Healthcare Providers: SeriousBroker.it  This test is not yet approved or cleared by the [place] FDA and has been authorized for detection and/or diagnosis of SARS-CoV-2 by FDA under an Emergency Use Authorization (EUA). This EUA will remain in effect (meaning this test can be used) for the duration of the COVID-19 declaration under Section 564(b)(1) of the Act, 21 U.S.C. section 360bbb-3(b)(1), unless the authorization is terminated or revoked.  Performed at [facility], [address], [place], [place] [address]          Radiology Studies: No results found.      Scheduled Meds:  amiodarone  200 mg Oral Daily   apixaban  2.5 mg Oral BID   diltiazem  300 mg Oral Daily   fluticasone furoate-vilanterol  1 puff Inhalation Daily   And   umeclidinium bromide  1 puff Inhalation Daily   furosemide  40 mg Oral Daily   guaiFENesin  1,200 mg Oral BID   insulin aspart  0-15 Units Subcutaneous TID AC & HS   metoprolol succinate  25 mg Oral Daily   sodium chloride flush  3 mL Intravenous Q12H   sodium chloride flush  3 mL Intravenous Q12H   traZODone  100 mg Oral QHS   Continuous Infusions:  sodium chloride 250 mL (03/23/23 2223)   sodium chloride Stopped (03/25/23 0352)     LOS: 6 days    Time spent: 25 mins     Charise Killian, MD [facility] Pager [phone]  If 7PM-7AM, please contact night-coverage [email or web address] 03/27/2023, 4:04 PM

## 2023-03-27 NOTE — Progress Notes (Signed)
Physical Therapy Treatment Patient Details Name: Leslie Parker MRN: [id number] DOB: 1933/07/23 Today's Date: 03/27/2023   History of Present Illness Pt is an 89 year old female presenting with hypoxia, admitted with CHF vs PNA     PMH significant for ongestive heart failure, hypertension, asthma, A-fib presenting with shortness of breath.  Patient recently had hip surgery (ORIF, 01/2023) and was discharged from rehab (approx two weeks prior to this admission).    PT Comments  Pt was long sitting in bed upon arrival. She agrees to session and remains cooperate throughout. Pt was able to exit bed, stand to RW and ambulate ~ 100 ft without safety concern. Sao2 > 92% on rm air. Acute PT will continue to follow and progress per current POC. DC recs remain appropriate.    If plan is discharge home, recommend the following: A little help with walking and/or transfers;A little help with bathing/dressing/bathroom;Assistance with cooking/housework;Direct supervision/assist for medications management;Direct supervision/assist for financial management;Assist for transportation;Help with stairs or ramp for entrance     Equipment Recommendations  None recommended by PT       Precautions / Restrictions Precautions Precautions: Fall Restrictions Weight Bearing Restrictions: No     Mobility  Bed Mobility Overal bed mobility: Needs Assistance Bed Mobility: Supine to Sit  Supine to sit: Supervision  Transfers Overall transfer level: Needs assistance Equipment used: Rolling walker (2 wheels) Transfers: Sit to/from Stand Sit to Stand: Supervision, Contact guard assist  General transfer comment: CGA for first attempt. Supervision for 2nd    Ambulation/Gait Ambulation/Gait assistance: Supervision Gait Distance (Feet): 100 Feet Assistive device: Rolling walker (2 wheels) Gait Pattern/deviations: Step-through pattern Gait velocity: decreased  General Gait Details: pt on rm air with sao2 > 92%  throughout     Balance Overall balance assessment: Needs assistance Sitting-balance support: No upper extremity supported, Feet supported Sitting balance-[name]: Good     Standing balance support: Single extremity supported, During functional activity Standing balance-[name]: Fair      Cognition Arousal: Alert Behavior During Therapy: WFL for tasks assessed/performed Overall Cognitive Status: Within Functional Limits for tasks assessed           Pertinent Vitals/Pain Pain Assessment Pain Assessment: No/denies pain Pain Location: R hip Pain Descriptors / Indicators: Aching, Grimacing, Sore Pain Intervention(s): Limited activity within patient's tolerance, Monitored during session, Premedicated before session, Repositioned     PT Goals (current goals can now be found in the care plan section) Acute Rehab PT Goals Patient Stated Goal: to get my strength back Progress towards PT goals: Progressing toward goals    Frequency    Min 1X/week       AM-PAC PT "6 Clicks" Mobility   Outcome Measure  Help needed turning from your back to your side while in a flat bed without using bedrails?: A Little Help needed moving from lying on your back to sitting on the side of a flat bed without using bedrails?: A Little Help needed moving to and from a bed to a chair (including a wheelchair)?: A Little Help needed standing up from a chair using your arms (e.g., wheelchair or bedside chair)?: A Little Help needed to walk in hospital room?: A Little Help needed climbing 3-5 steps with a railing? : A Little 6 Click Score: 18    End of Session   Activity Tolerance: Patient tolerated treatment well;Patient limited by fatigue Patient left: in chair;with call bell/phone within reach;with chair alarm set Nurse Communication: Mobility status PT Visit Diagnosis:  Muscle weakness (generalized) (M62.81);Difficulty in walking, not elsewhere classified (R26.2)     Time:  4098-1191 PT Time Calculation (min) (ACUTE ONLY): 12 min  Charges:    $Gait Training: 8-22 mins PT General Charges $$ ACUTE PT VISIT: 1 Visit                     Jetta Lout PTA 03/27/23, 4:21 PM

## 2023-03-27 NOTE — Progress Notes (Signed)
Occupational Therapy Treatment Patient Details Name: Leslie Parker MRN: [id number] DOB: 1932/12/25 Today's Date: 03/27/2023   History of present illness Pt is an 89 year old female presenting with hypoxia, admitted with CHF vs PNA     PMH significant for ongestive heart failure, hypertension, asthma, A-fib presenting with shortness of breath.  Patient recently had hip surgery (ORIF, 01/2023) and was discharged from rehab (approx two weeks prior to this admission).   OT comments  Pt received in recliner with RN present, agreeable to perform session focusing on improving activity tolerance, balance and functional mobility. Pt performing STS from recliner with RW and CGA, incontinent of urine in standing despite pure wick in place. Pt completes bout of functional mobility t/f door using RW and CGA from OT. Change of undergarments from sit/stand with minA, pt with good balance in standing to perform pericare. Pt reports being fatigued with walking, SpO2% at 97%. Pt left in recliner, needs within reach and alarm activated.       If plan is discharge home, recommend the following:  A little help with walking and/or transfers;A little help with bathing/dressing/bathroom;Direct supervision/assist for financial management;Assistance with cooking/housework;Direct supervision/assist for medications management;Assist for transportation;Help with stairs or ramp for entrance   Equipment Recommendations  None recommended by OT       Precautions / Restrictions Precautions Precautions: Fall Restrictions Weight Bearing Restrictions: No       Mobility Bed Mobility               General bed mobility comments: did not witness    Transfers Overall transfer level: Needs assistance Equipment used: Rolling walker (2 wheels) Transfers: Sit to/from Stand Sit to Stand: Contact guard assist           General transfer comment: STS from recliner, CGA     Balance Overall balance assessment: Needs  assistance Sitting-balance support: No upper extremity supported, Feet supported Sitting balance-[name]: Good     Standing balance support: Single extremity supported, During functional activity Standing balance-[name]: Fair Standing balance comment: Reliant on RW, forward flexed and R lean in standing, multimodal cues to correct                           ADL either performed or assessed with clinical judgement   ADL Overall ADL's : Needs assistance/impaired                     Lower Body Dressing: Sit to/from stand;Minimal assistance Lower Body Dressing Details (indicate cue type and reason): minA to doff/don underwear, CGA in standing     Toileting- Clothing Manipulation and Hygiene: Minimal assistance;Sit to/from stand Toileting - Clothing Manipulation Details (indicate cue type and reason): pt incontinent of urine upon standing; able to manage clothing and perform pericare with CGA in standing     Functional mobility during ADLs: Contact guard assist;Rolling walker (2 wheels) General ADL Comments: Pt completes bout of functional mobility t/f door using RW and CGA from OT. Declines need for further ADLs at this time. Pt reports being fatigued with walking, VSS.      Cognition Arousal: Alert Behavior During Therapy: WFL for tasks assessed/performed Overall Cognitive Status: Within Functional Limits for tasks assessed                                 General Comments: agreeable and participatory  Pertinent Vitals/ Pain       Pain Assessment Pain Assessment: No/denies pain   Frequency  Min 1X/week        Progress Toward Goals  OT Goals(current goals can now be found in the care plan section)  Progress towards OT goals: Progressing toward goals  Acute Rehab OT Goals OT Goal Formulation: With patient Time For Goal Achievement: 04/04/23 Potential to Achieve Goals: Good ADL Goals Pt Will Perform  Grooming: with modified independence;sitting;standing Pt Will Perform Lower Body Dressing: with modified independence;sitting/lateral leans Pt Will Transfer to Toilet: with modified independence;ambulating Pt Will Perform Toileting - Clothing Manipulation and hygiene: with modified independence;sitting/lateral leans  Plan         AM-PAC OT "6 Clicks" Daily Activity     Outcome Measure   Help from another person eating meals?: None Help from another person taking care of personal grooming?: None Help from another person toileting, which includes using toliet, bedpan, or urinal?: A Little Help from another person bathing (including washing, rinsing, drying)?: A Little Help from another person to put on and taking off regular upper body clothing?: A Little Help from another person to put on and taking off regular lower body clothing?: A Little 6 Click Score: 20    End of Session Equipment Utilized During Treatment: Gait belt;Rolling walker (2 wheels)  OT Visit Diagnosis: Other abnormalities of gait and mobility (R26.89);Muscle weakness (generalized) (M62.81)   Activity Tolerance Patient tolerated treatment well   Patient Left in chair;with call bell/phone within reach;with chair alarm set   Nurse Communication Other (comment)        Time: 1761-6073 OT Time Calculation (min): 27 min  Charges: OT General Charges $OT Visit: 1 Visit OT Treatments $Self Care/Home Management : 23-37 mins  Leslie Parker, OTR/L  03/27/23, 3:36 PM   Leslie Parker 03/27/2023, 3:34 PM

## 2023-03-27 NOTE — Plan of Care (Signed)
  Problem: Education: Goal: Ability to demonstrate management of disease process will improve Outcome: Progressing Goal: Ability to verbalize understanding of medication therapies will improve Outcome: Progressing   Problem: Activity: Goal: Capacity to carry out activities will improve Outcome: Progressing   Problem: Cardiac: Goal: Ability to achieve and maintain adequate cardiopulmonary perfusion will improve Outcome: Progressing   Problem: Education: Goal: Ability to describe self-care measures that may prevent or decrease complications (Diabetes Survival Skills Education) will improve Outcome: Progressing

## 2023-03-28 ENCOUNTER — Encounter: Payer: Medicare HMO | Admitting: Family

## 2023-03-28 DIAGNOSIS — I5033 Acute on chronic diastolic (congestive) heart failure: Secondary | ICD-10-CM | POA: Diagnosis not present

## 2023-03-28 LAB — BASIC METABOLIC PANEL
Anion gap: 9 (ref 5–15)
BUN: 35 mg/dL — ABNORMAL HIGH (ref 8–23)
CO2: 28 mmol/L (ref 22–32)
Calcium: 7.9 mg/dL — ABNORMAL LOW (ref 8.9–10.3)
Chloride: 100 mmol/L (ref 98–111)
Creatinine, Ser: 1.68 mg/dL — ABNORMAL HIGH (ref 0.44–1.00)
GFR, Estimated: 29 mL/min — ABNORMAL LOW (ref >60)
Glucose, Bld: 129 mg/dL — ABNORMAL HIGH (ref 70–99)
Potassium: 3.2 mmol/L — ABNORMAL LOW (ref 3.5–5.1)
Sodium: 137 mmol/L (ref 135–145)

## 2023-03-28 LAB — CBC
HCT: 29.2 % — ABNORMAL LOW (ref 36.0–46.0)
Hemoglobin: 9.6 g/dL — ABNORMAL LOW (ref 12.0–15.0)
MCH: 30.7 pg (ref 26.0–34.0)
MCHC: 32.9 g/dL (ref 30.0–36.0)
MCV: 93.3 fL (ref 80.0–100.0)
Platelets: 223 10*3/uL (ref 150–400)
RBC: 3.13 MIL/uL — ABNORMAL LOW (ref 3.87–5.11)
RDW: 13.8 % (ref 11.5–15.5)
WBC: 5.3 10*3/uL (ref 4.0–10.5)
nRBC: 0 % (ref 0.0–0.2)

## 2023-03-28 LAB — GLUCOSE, CAPILLARY
Glucose-Capillary: 112 mg/dL — ABNORMAL HIGH (ref 70–99)
Glucose-Capillary: 135 mg/dL — ABNORMAL HIGH (ref 70–99)
Glucose-Capillary: 166 mg/dL — ABNORMAL HIGH (ref 70–99)
Glucose-Capillary: 118 mg/dL — ABNORMAL HIGH (ref 70–99)

## 2023-03-28 MED ORDER — POTASSIUM CHLORIDE CRYS ER 20 MEQ PO TBCR
20.0000 meq | EXTENDED_RELEASE_TABLET | Freq: Once | ORAL | Status: AC
  Administered 2023-03-28: 20 meq via ORAL
  Filled 2023-03-28: qty 1

## 2023-03-28 NOTE — Care Management Important Message (Signed)
Important Message  Patient Details  Name: Leslie Parker MRN: [id number] Date of Birth: 1933-04-20   Medicare Important Message Given:  Yes     [name] 03/28/2023, 2:14 PM

## 2023-03-28 NOTE — Plan of Care (Signed)
  Problem: Activity: Goal: Capacity to carry out activities will improve Outcome: Not Progressing   Problem: Cardiac: Goal: Ability to achieve and maintain adequate cardiopulmonary perfusion will improve Outcome: Progressing

## 2023-03-28 NOTE — Progress Notes (Signed)
Physical Therapy Treatment Patient Details Name: ATIYA ILIFF MRN: [id number] DOB: 07-18-1933 Today's Date: 03/28/2023   History of Present Illness Pt is an 89 year old female presenting with hypoxia, admitted with CHF vs PNA     PMH significant for ongestive heart failure, hypertension, asthma, A-fib presenting with shortness of breath.  Patient recently had hip surgery (ORIF, 01/2023) and was discharged from rehab (approx two weeks prior to this admission).    PT Comments  Pt agreeable to PT session. Received in bed and initially warned therapist of Hx with urine incontinence upon standing at EOB. Pt however experienced standing incontinence x 4 each time requiring assistance for hygiene and linen change. Overall, once able to mobilize in hall with RW and Supervision, pt tolerated well without LOB or SOB. Pt positioned to comfort in bedside chair for completion of B LE there ex. Will plan to continue PT in acute setting and plan accordingly to prevent future bouts of incontinence.    If plan is discharge home, recommend the following: A little help with walking and/or transfers;A little help with bathing/dressing/bathroom;Assistance with cooking/housework;Direct supervision/assist for medications management;Direct supervision/assist for financial management;Assist for transportation;Help with stairs or ramp for entrance   Can travel by private vehicle        Equipment Recommendations  None recommended by PT    Recommendations for Other Services       Precautions / Restrictions Precautions Precautions: Fall Restrictions Weight Bearing Restrictions: No     Mobility  Bed Mobility Overal bed mobility: Needs Assistance Bed Mobility: Supine to Sit     Supine to sit: Supervision, HOB elevated, Used rails     General bed mobility comments:  (HOB elevated with use of Rail)    Transfers Overall transfer level: Needs assistance Equipment used: Rolling walker (2 wheels) Transfers: Sit  to/from Stand Sit to Stand: Supervision           General transfer comment: Pt transferred several times at bedside due to repeated urine incontinence every time upon standing.    Ambulation/Gait Ambulation/Gait assistance: Supervision Gait Distance (Feet):  (100) Assistive device: Rolling walker (2 wheels) Gait Pattern/deviations: Step-through pattern, Decreased stride length Gait velocity: decreased     General Gait Details: pt on rm air with sao2 > 94% throughout   Stairs             Wheelchair Mobility     Tilt Bed    Modified Rankin (Stroke Patients Only)       Balance Overall balance assessment: Needs assistance Sitting-balance support: No upper extremity supported, Feet supported Sitting balance-[name]: Good     Standing balance support: During functional activity, Bilateral upper extremity supported, Reliant on assistive device for balance Standing balance-[name]: Fair Standing balance comment: Reliant on RW, forward flexed and R lean in standing, multimodal cues to correct                            Cognition Arousal: Alert Behavior During Therapy: WFL for tasks assessed/performed Overall Cognitive Status: Within Functional Limits for tasks assessed                                 General Comments:  (Able to follow muti-step commands)        Exercises General Exercises - Lower Extremity Ankle Circles/Pumps: AROM, Both, 10 reps, Seated Long Arc Quad: AROM, Both,  10 reps, Seated Hip Flexion/Marching: AROM, Both, 10 reps, Seated    General Comments General comments (skin integrity, edema, etc.):  (Pt educated on role of PT and benefits of mobility with good understanding)      Pertinent Vitals/Pain Pain Assessment Pain Assessment: No/denies pain    Home Living                          Prior Function            PT Goals (current goals can now be found in the care plan section) Acute  Rehab PT Goals Patient Stated Goal: to get my strength back Progress towards PT goals: Progressing toward goals    Frequency    Min 1X/week      PT Plan      Co-evaluation              AM-PAC PT "6 Clicks" Mobility   Outcome Measure  Help needed turning from your back to your side while in a flat bed without using bedrails?: A Little Help needed moving from lying on your back to sitting on the side of a flat bed without using bedrails?: A Little Help needed moving to and from a bed to a chair (including a wheelchair)?: A Little Help needed standing up from a chair using your arms (e.g., wheelchair or bedside chair)?: A Little Help needed to walk in hospital room?: A Little Help needed climbing 3-5 steps with a railing? : A Little 6 Click Score: 18    End of Session Equipment Utilized During Treatment: Gait belt Activity Tolerance: Patient tolerated treatment well;Patient limited by fatigue Patient left: in chair;with call bell/phone within reach;with chair alarm set Nurse Communication: Mobility status PT Visit Diagnosis: Muscle weakness (generalized) (M62.81);Difficulty in walking, not elsewhere classified (R26.2)     Time: 1420-1500 PT Time Calculation (min) (ACUTE ONLY): 40 min  Charges:    $Gait Training: 8-22 mins $Therapeutic Exercise: 8-22 mins $Therapeutic Activity: 8-22 mins PT General Charges $$ ACUTE PT VISIT: 1 Visit                    Zadie Cleverly, PTA  Jannet Askew 03/28/2023, 3:53 PM

## 2023-03-28 NOTE — Progress Notes (Signed)
PROGRESS NOTE    Leslie Parker  BMW:[id number] DOB: 1932/07/24 DOA: 03/20/2023 PCP: [name], MD  Assessment & Plan:   Principal Problem:   Shortness of breath Active Problems:   Acute on chronic diastolic CHF (congestive heart failure) (HCC)   Essential hypertension   COPD (chronic obstructive pulmonary disease) (HCC)   Hypertension   Asthma, chronic   Hypomagnesemia   Diabetes mellitus without complication (HCC)   CKD stage 3 due to type 2 diabetes mellitus (HCC)   Paroxysmal atrial fibrillation (HCC)   Abnormal LFTs   Anemia  Assessment and Plan: Acute hypoxic respiratory failure: etiology unclear. Supplemental oxygen has since been weaned off. Saturating in mid 90s on RA. Resolved   Possible pneumonia: completed 5 day course of rocephin, azithromycin. Continue bronchodilators & encourage incentive spirometry. CXR showed multifocal nodular opacity in the left lower lobe, possibly endobronchial. The largest nodule measures 11 mm. 31-month follow-up recommended to ensure resolution.   Acute on chronic diastolic CHF: continue on lasix, metoprolol. Monitor I/Os   Chronic a. fib: continue on eliquis, metoprolol, amio & diltiazem    HTN: continue on metoprolol, amio, diltiazem    DM2: well controlled, HbA1c 5.8. Continue on SSI w/ accuchecks    COPD: w/o exacerbation. Bronchodilators prn   Physical deconditioning: PT/OT recs home health   Sacral pressure injury: present on admission. Continue w/ wound care    AKI: Cr is labile. Avoid nephrotoxic meds. Baseline Cr 1.7.   Hypokalemia: potassium ordered      DVT prophylaxis: eliquis Code Status: full  Family Communication:  Disposition Plan: likely d/c back home w/ family care    Level of care: Progressive Status is: Inpatient Remains inpatient appropriate because: medically stable but no one at home can care for pt currently. Pt's daughter comes on 03/31/23 at night as per pt and pt will live w/ daughter  then. Will d/c home on 04/01/23 morning     Consultants:    Procedures:   Antimicrobials:   Subjective: Pt c/o fatigue   Objective: Vitals:   03/28/23 0127 03/28/23 0450 03/28/23 0801 03/28/23 1226  BP: (!) 171/59 (!) 154/56 (!) 170/68 (!) 186/72  Pulse: 60 (!) 59 60 66  Resp:  18 16 16   Temp:  97.9 F (36.6 C) 98.1 F (36.7 C) 98.8 F (37.1 C)  TempSrc:  Oral Oral Oral  SpO2:  95% 94% 96%  Weight:      Height:        Intake/Output Summary (Last 24 hours) at 03/28/2023 1426 Last data filed at 03/28/2023 0453 Gross per 24 hour  Intake 240 ml  Output 650 ml  Net -410 ml   Filed Weights   03/20/23 1730 03/21/23 0641 03/24/23 0701  Weight: 81 kg 90.4 kg 85.6 kg    Examination:  General exam: appears comfortable  Respiratory system: decreased breath sounds b/l Cardiovascular system: S1/S2+. No rubs or clicks  Gastrointestinal system: Abd is soft, NT, ND & normal bowel sounds  Central nervous system: alert and oriented. Moves all extremities Psychiatry: Judgement and insight appears normal. Appropriate mood and affect    Data Reviewed: I have personally reviewed following labs and imaging studies  CBC: Recent Labs  Lab 03/28/23 0429  WBC 5.3  HGB 9.6*  HCT 29.2*  MCV 93.3  PLT 223   Basic Metabolic Panel: Recent Labs  Lab 03/22/23 0421 03/23/23 0953 03/24/23 0908 03/25/23 0959 03/28/23 0429  NA 136 135 135 135 137  K 3.2* 4.0 3.6  3.7 3.2*  CL 100 102 100 100 100  CO2 24 22 24 24 28   GLUCOSE 103* 143* 123* 155* 129*  BUN 39* 38* 38* 35* 35*  CREATININE 1.83* 1.66* 1.46* 1.42* 1.68*  CALCIUM 8.5* 8.0* 8.0* 8.6* 7.9*  MG  --  1.7  --   --   --    GFR: Estimated Creatinine Clearance: 27 mL/min (A) (by C-G formula based on SCr of 1.68 mg/dL (H)). Liver Function Tests: No results for input(s): "AST", "ALT", "ALKPHOS", "BILITOT", "PROT", "ALBUMIN" in the last 168 hours.  No results for input(s): "LIPASE", "AMYLASE" in the last 168 hours. No  results for input(s): "AMMONIA" in the last 168 hours. Coagulation Profile: No results for input(s): "INR", "PROTIME" in the last 168 hours. Cardiac Enzymes: No results for input(s): "CKTOTAL", "CKMB", "CKMBINDEX", "TROPONINI" in the last 168 hours. BNP (last 3 results) No results for input(s): "PROBNP" in the last 8760 hours. HbA1C: No results for input(s): "HGBA1C" in the last 72 hours. CBG: Recent Labs  Lab 03/27/23 1232 03/27/23 1656 03/27/23 2106 03/28/23 0800 03/28/23 1234  GLUCAP 131* 173* 121* 112* 135*   Lipid Profile: No results for input(s): "CHOL", "HDL", "LDLCALC", "TRIG", "CHOLHDL", "LDLDIRECT" in the last 72 hours. Thyroid Function Tests: No results for input(s): "TSH", "T4TOTAL", "FREET4", "T3FREE", "THYROIDAB" in the last 72 hours. Anemia Panel: No results for input(s): "VITAMINB12", "FOLATE", "FERRITIN", "TIBC", "IRON", "RETICCTPCT" in the last 72 hours. Sepsis Labs: No results for input(s): "PROCALCITON", "LATICACIDVEN" in the last 168 hours.   Recent Results (from the past 240 hour(s))  Resp panel by RT-PCR (RSV, Flu A&B, Covid) Anterior Nasal Swab     Status: None   Collection Time: 03/20/23  8:26 PM   Specimen: Anterior Nasal Swab  Result Value Ref Range Status   SARS Coronavirus 2 by RT PCR NEGATIVE NEGATIVE Final    Comment: (NOTE) SARS-CoV-2 target nucleic acids are NOT DETECTED.  The SARS-CoV-2 RNA is generally detectable in upper respiratory specimens during the acute phase of infection. The lowest concentration of SARS-CoV-2 viral copies this assay can detect is 138 copies/mL. A negative result does not preclude SARS-Cov-2 infection and should not be used as the sole basis for treatment or other patient management decisions. A negative result may occur with  improper specimen collection/handling, submission of specimen other than nasopharyngeal swab, presence of viral mutation(s) within the areas targeted by this assay, and inadequate number  of viral copies(<138 copies/mL). A negative result must be combined with clinical observations, patient history, and epidemiological information. The expected result is Negative.  Fact Sheet for Patients:  BloggerCourse.com  Fact Sheet for Healthcare Providers:  SeriousBroker.it  This test is no t yet approved or cleared by the [place] FDA and  has been authorized for detection and/or diagnosis of SARS-CoV-2 by FDA under an Emergency Use Authorization (EUA). This EUA will remain  in effect (meaning this test can be used) for the duration of the COVID-19 declaration under Section 564(b)(1) of the Act, 21 U.S.C.section 360bbb-3(b)(1), unless the authorization is terminated  or revoked sooner.       Influenza A by PCR NEGATIVE NEGATIVE Final   Influenza B by PCR NEGATIVE NEGATIVE Final    Comment: (NOTE) The Xpert Xpress SARS-CoV-2/FLU/RSV plus assay is intended as an aid in the diagnosis of influenza from Nasopharyngeal swab specimens and should not be used as a sole basis for treatment. Nasal washings and aspirates are unacceptable for Xpert Xpress SARS-CoV-2/FLU/RSV testing.  Fact Sheet for  Patients: BloggerCourse.com  Fact Sheet for Healthcare Providers: SeriousBroker.it  This test is not yet approved or cleared by the [place] FDA and has been authorized for detection and/or diagnosis of SARS-CoV-2 by FDA under an Emergency Use Authorization (EUA). This EUA will remain in effect (meaning this test can be used) for the duration of the COVID-19 declaration under Section 564(b)(1) of the Act, 21 U.S.C. section 360bbb-3(b)(1), unless the authorization is terminated or revoked.     Resp Syncytial Virus by PCR NEGATIVE NEGATIVE Final    Comment: (NOTE) Fact Sheet for Patients: BloggerCourse.com  Fact Sheet for Healthcare  Providers: SeriousBroker.it  This test is not yet approved or cleared by the [place] FDA and has been authorized for detection and/or diagnosis of SARS-CoV-2 by FDA under an Emergency Use Authorization (EUA). This EUA will remain in effect (meaning this test can be used) for the duration of the COVID-19 declaration under Section 564(b)(1) of the Act, 21 U.S.C. section 360bbb-3(b)(1), unless the authorization is terminated or revoked.  Performed at [facility], [address] [place], [place] [address]          Radiology Studies: No results found.      Scheduled Meds:  amiodarone  200 mg Oral Daily   apixaban  2.5 mg Oral BID   diltiazem  300 mg Oral Daily   fluticasone furoate-vilanterol  1 puff Inhalation Daily   And   umeclidinium bromide  1 puff Inhalation Daily   furosemide  40 mg Oral Daily   guaiFENesin  1,200 mg Oral BID   insulin aspart  0-15 Units Subcutaneous TID AC & HS   metoprolol succinate  25 mg Oral Daily   sodium chloride flush  3 mL Intravenous Q12H   sodium chloride flush  3 mL Intravenous Q12H   traZODone  100 mg Oral QHS   Continuous Infusions:  sodium chloride 250 mL (03/23/23 2223)   sodium chloride Stopped (03/25/23 0352)     LOS: 7 days    Time spent: 25 mins     Charise Killian, MD [facility] Pager [phone]  If 7PM-7AM, please contact night-coverage [email or web address] 03/28/2023, 2:26 PM

## 2023-03-29 DIAGNOSIS — I5033 Acute on chronic diastolic (congestive) heart failure: Secondary | ICD-10-CM | POA: Diagnosis not present

## 2023-03-29 LAB — GLUCOSE, CAPILLARY
Glucose-Capillary: 115 mg/dL — ABNORMAL HIGH (ref 70–99)
Glucose-Capillary: 147 mg/dL — ABNORMAL HIGH (ref 70–99)
Glucose-Capillary: 151 mg/dL — ABNORMAL HIGH (ref 70–99)

## 2023-03-29 LAB — CBC
HCT: 31.1 % — ABNORMAL LOW (ref 36.0–46.0)
Hemoglobin: 9.8 g/dL — ABNORMAL LOW (ref 12.0–15.0)
MCH: 30.2 pg (ref 26.0–34.0)
MCHC: 31.5 g/dL (ref 30.0–36.0)
MCV: 95.7 fL (ref 80.0–100.0)
Platelets: 231 10*3/uL (ref 150–400)
RBC: 3.25 MIL/uL — ABNORMAL LOW (ref 3.87–5.11)
RDW: 13.8 % (ref 11.5–15.5)
WBC: 5.5 10*3/uL (ref 4.0–10.5)
nRBC: 0 % (ref 0.0–0.2)

## 2023-03-29 LAB — BASIC METABOLIC PANEL
Anion gap: 9 (ref 5–15)
BUN: 34 mg/dL — ABNORMAL HIGH (ref 8–23)
CO2: 26 mmol/L (ref 22–32)
Calcium: 8 mg/dL — ABNORMAL LOW (ref 8.9–10.3)
Chloride: 104 mmol/L (ref 98–111)
Creatinine, Ser: 1.68 mg/dL — ABNORMAL HIGH (ref 0.44–1.00)
GFR, Estimated: 29 mL/min — ABNORMAL LOW (ref >60)
Glucose, Bld: 121 mg/dL — ABNORMAL HIGH (ref 70–99)
Potassium: 3.4 mmol/L — ABNORMAL LOW (ref 3.5–5.1)
Sodium: 139 mmol/L (ref 135–145)

## 2023-03-29 MED ORDER — POTASSIUM CHLORIDE CRYS ER 20 MEQ PO TBCR
20.0000 meq | EXTENDED_RELEASE_TABLET | Freq: Once | ORAL | Status: AC
  Administered 2023-03-29: 20 meq via ORAL
  Filled 2023-03-29: qty 1

## 2023-03-29 NOTE — Progress Notes (Signed)
Physical Therapy Treatment Patient Details Name: Leslie Parker MRN: [id number] DOB: 02/19/1933 Today's Date: 03/29/2023   History of Present Illness Pt is an 89 year old female presenting with hypoxia, admitted with CHF vs PNA     PMH significant for ongestive heart failure, hypertension, asthma, A-fib presenting with shortness of breath.  Patient recently had hip surgery (ORIF, 01/2023) and was discharged from rehab (approx two weeks prior to this admission).    PT Comments  Pt received in chair, focused session on transfers and progressive gait training with RW. Pt continues to favor R LE during stance phase, however no LOB noted and pt appears safe with mobility. Will continue to progress in acute setting. Awaiting d/c home Monday once daughter arrives.   If plan is discharge home, recommend the following: A little help with walking and/or transfers;A little help with bathing/dressing/bathroom;Assistance with cooking/housework;Direct supervision/assist for medications management;Direct supervision/assist for financial management;Assist for transportation;Help with stairs or ramp for entrance   Can travel by private vehicle        Equipment Recommendations  None recommended by PT    Recommendations for Other Services       Precautions / Restrictions Precautions Precautions: Fall Restrictions Weight Bearing Restrictions: No     Mobility  Bed Mobility               General bed mobility comments: pt in chair pre/post session    Transfers Overall transfer level: Needs assistance Equipment used: Rolling walker (2 wheels) Transfers: Sit to/from Stand Sit to Stand: Supervision, Contact guard assist           General transfer comment:  (Able to stand at [facility] on first attempt)    Ambulation/Gait Ambulation/Gait assistance: Supervision Gait Distance (Feet): 120 Feet Assistive device: Rolling walker (2 wheels) Gait Pattern/deviations: Step-through pattern, Decreased stride  length Gait velocity: decreased     General Gait Details: Increased gait distance tolerance   Stairs             Wheelchair Mobility     Tilt Bed    Modified Rankin (Stroke Patients Only)       Balance Overall balance assessment: Needs assistance Sitting-balance support: No upper extremity supported, Feet supported Sitting balance-[name]: Good     Standing balance support: During functional activity, Bilateral upper extremity supported, Reliant on assistive device for balance, Single extremity supported Standing balance-[name]: Fair Standing balance comment: Reliant on RW, forward flexed and R lean in standing, multimodal cues to correct                            Cognition Arousal: Alert Behavior During Therapy: WFL for tasks assessed/performed Overall Cognitive Status: Within Functional Limits for tasks assessed                                 General Comments: agreeable and participatory        Exercises      General Comments        Pertinent Vitals/Pain Pain Assessment Pain Assessment: No/denies pain    Home Living                          Prior Function            PT Goals (current goals can now be found in the care plan section) Acute  Rehab PT Goals Patient Stated Goal: to get my strength back Progress towards PT goals: Progressing toward goals    Frequency    Min 1X/week      PT Plan      Co-evaluation              AM-PAC PT "6 Clicks" Mobility   Outcome Measure  Help needed turning from your back to your side while in a flat bed without using bedrails?: A Little Help needed moving from lying on your back to sitting on the side of a flat bed without using bedrails?: A Little Help needed moving to and from a bed to a chair (including a wheelchair)?: A Little Help needed standing up from a chair using your arms (e.g., wheelchair or bedside chair)?: A Little Help needed to  walk in hospital room?: A Little Help needed climbing 3-5 steps with a railing? : A Little 6 Click Score: 18    End of Session Equipment Utilized During Treatment: Gait belt Activity Tolerance: Patient tolerated treatment well;Patient limited by fatigue Patient left: in chair;with call bell/phone within reach;with chair alarm set Nurse Communication: Mobility status PT Visit Diagnosis: Muscle weakness (generalized) (M62.81);Difficulty in walking, not elsewhere classified (R26.2)     Time: 1511-1530 PT Time Calculation (min) (ACUTE ONLY): 19 min  Charges:    $Gait Training: 8-22 mins PT General Charges $$ ACUTE PT VISIT: 1 Visit                    Zadie Cleverly, PTA  Jannet Askew 03/29/2023, 4:09 PM

## 2023-03-29 NOTE — Progress Notes (Signed)
PROGRESS NOTE    Leslie Parker  ZOX:[id number] DOB: [birth year] DOA: 03/20/2023 PCP: [name], MD  Assessment & Plan:   Principal Problem:   Shortness of breath Active Problems:   Acute on chronic diastolic CHF (congestive heart failure) (HCC)   Essential hypertension   COPD (chronic obstructive pulmonary disease) (HCC)   Hypertension   Asthma, chronic   Hypomagnesemia   Diabetes mellitus without complication (HCC)   CKD stage 3 due to type 2 diabetes mellitus (HCC)   Paroxysmal atrial fibrillation (HCC)   Abnormal LFTs   Anemia  Assessment and Plan: Acute hypoxic respiratory failure: etiology unclear. Supplemental oxygen has since been weaned off. Saturating in mid 90s on RA. Resolved  Possible pneumonia: completed 5 day course of rocephin, azithromycin. Continue bronchodilators & encourage incentive spirometry. CXR showed multifocal nodular opacity in the left lower lobe, possibly endobronchial. The largest nodule measures 11 mm. 45-month follow-up recommended to ensure resolution.   Acute on chronic diastolic CHF: continue on lasix, metoprolol. Monitor I/Os   Chronic a. fib: continue on diltiazem, metoprolol, amio & elqiuis     HTN: continue on diltiazem, metoprolol, amiodarone    DM2: well controlled, HbA1c 5.8. Continue on SSI w/ accuchecks   COPD: w/o exacerbation. Bronchodilators prn   Physical deconditioning: PT/OT recs HH   Sacral pressure injury: present on admission. Continue w/ wound care    AKI: Cr is stable from day prior.  Hypokalemia: KCl repleated. Will check Mg level in AM   Normocytic anemia: H&H are labile. No need for a transfusion currently     DVT prophylaxis: eliquis Code Status: full  Family Communication:  Disposition Plan: likely d/c back home w/ family care    Level of care: Progressive Status is: Inpatient Remains inpatient appropriate because: medically stable but no one at home can care for pt currently. Pt's daughter  comes on 03/31/23 at night as per pt and pt will live w/ daughter then. Will d/c home on 04/01/23 morning     Consultants:    Procedures:   Antimicrobials:   Subjective: Pt c/o malaise   Objective: Vitals:   03/28/23 1958 03/28/23 2356 03/29/23 0409 03/29/23 0823  BP: (!) 156/64 (!) 176/66 (!) 156/64 (!) 189/68  Pulse: (!) 59 64 61 62  Resp: 18 18 14 17   Temp: 99.1 F (37.3 C) 97.6 F (36.4 C) 98 F (36.7 C) 98.3 F (36.8 C)  TempSrc: Oral Oral Oral Oral  SpO2: 98% 94% 93% 96%  Weight:      Height:        Intake/Output Summary (Last 24 hours) at 03/29/2023 0901 Last data filed at 03/28/2023 1958 Gross per 24 hour  Intake 240 ml  Output 750 ml  Net -510 ml   Filed Weights   03/20/23 1730 03/21/23 0641 03/24/23 0701  Weight: 81 kg 90.4 kg 85.6 kg    Examination:  General exam: appears calm & comfortable  Respiratory system: diminished breath sounds b/l  Cardiovascular system: S1 & S2+. No rubs or clicks  Gastrointestinal system: abd is soft, NT, ND & hypoactive bowel sounds  Central nervous system: alert & oriented. Moves all extremities  Psychiatry: judgement and insight appears normal. Appropriate mood and affect     Data Reviewed: I have personally reviewed following labs and imaging studies  CBC: Recent Labs  Lab 03/28/23 0429 03/29/23 0421  WBC 5.3 5.5  HGB 9.6* 9.8*  HCT 29.2* 31.1*  MCV 93.3 95.7  PLT 223 231  Basic Metabolic Panel: Recent Labs  Lab 03/23/23 0953 03/24/23 0908 03/25/23 0959 03/28/23 0429 03/29/23 0421  NA 135 135 135 137 139  K 4.0 3.6 3.7 3.2* 3.4*  CL 102 100 100 100 104  CO2 22 24 24 28 26   GLUCOSE 143* 123* 155* 129* 121*  BUN 38* 38* 35* 35* 34*  CREATININE 1.66* 1.46* 1.42* 1.68* 1.68*  CALCIUM 8.0* 8.0* 8.6* 7.9* 8.0*  MG 1.7  --   --   --   --    GFR: Estimated Creatinine Clearance: 27 mL/min (A) (by C-G formula based on SCr of 1.68 mg/dL (H)). Liver Function Tests: No results for input(s): "AST", "ALT",  "ALKPHOS", "BILITOT", "PROT", "ALBUMIN" in the last 168 hours.  No results for input(s): "LIPASE", "AMYLASE" in the last 168 hours. No results for input(s): "AMMONIA" in the last 168 hours. Coagulation Profile: No results for input(s): "INR", "PROTIME" in the last 168 hours. Cardiac Enzymes: No results for input(s): "CKTOTAL", "CKMB", "CKMBINDEX", "TROPONINI" in the last 168 hours. BNP (last 3 results) No results for input(s): "PROBNP" in the last 8760 hours. HbA1C: No results for input(s): "HGBA1C" in the last 72 hours. CBG: Recent Labs  Lab 03/28/23 0800 03/28/23 1234 03/28/23 1613 03/28/23 2112 03/29/23 0820  GLUCAP 112* 135* 166* 118* 115*   Lipid Profile: No results for input(s): "CHOL", "HDL", "LDLCALC", "TRIG", "CHOLHDL", "LDLDIRECT" in the last 72 hours. Thyroid Function Tests: No results for input(s): "TSH", "T4TOTAL", "FREET4", "T3FREE", "THYROIDAB" in the last 72 hours. Anemia Panel: No results for input(s): "VITAMINB12", "FOLATE", "FERRITIN", "TIBC", "IRON", "RETICCTPCT" in the last 72 hours. Sepsis Labs: No results for input(s): "PROCALCITON", "LATICACIDVEN" in the last 168 hours.   Recent Results (from the past 240 hour(s))  Resp panel by RT-PCR (RSV, Flu A&B, Covid) Anterior Nasal Swab     Status: None   Collection Time: 03/20/23  8:26 PM   Specimen: Anterior Nasal Swab  Result Value Ref Range Status   SARS Coronavirus 2 by RT PCR NEGATIVE NEGATIVE Final    Comment: (NOTE) SARS-CoV-2 target nucleic acids are NOT DETECTED.  The SARS-CoV-2 RNA is generally detectable in upper respiratory specimens during the acute phase of infection. The lowest concentration of SARS-CoV-2 viral copies this assay can detect is 138 copies/mL. A negative result does not preclude SARS-Cov-2 infection and should not be used as the sole basis for treatment or other patient management decisions. A negative result may occur with  improper specimen collection/handling, submission of  specimen other than nasopharyngeal swab, presence of viral mutation(s) within the areas targeted by this assay, and inadequate number of viral copies(<138 copies/mL). A negative result must be combined with clinical observations, patient history, and epidemiological information. The expected result is Negative.  Fact Sheet for Patients:  BloggerCourse.com  Fact Sheet for Healthcare Providers:  SeriousBroker.it  This test is no t yet approved or cleared by the [place] FDA and  has been authorized for detection and/or diagnosis of SARS-CoV-2 by FDA under an Emergency Use Authorization (EUA). This EUA will remain  in effect (meaning this test can be used) for the duration of the COVID-19 declaration under Section 564(b)(1) of the Act, 21 U.S.C.section 360bbb-3(b)(1), unless the authorization is terminated  or revoked sooner.       Influenza A by PCR NEGATIVE NEGATIVE Final   Influenza B by PCR NEGATIVE NEGATIVE Final    Comment: (NOTE) The Xpert Xpress SARS-CoV-2/FLU/RSV plus assay is intended as an aid in the diagnosis of influenza from  Nasopharyngeal swab specimens and should not be used as a sole basis for treatment. Nasal washings and aspirates are unacceptable for Xpert Xpress SARS-CoV-2/FLU/RSV testing.  Fact Sheet for Patients: BloggerCourse.com  Fact Sheet for Healthcare Providers: SeriousBroker.it  This test is not yet approved or cleared by the [place] FDA and has been authorized for detection and/or diagnosis of SARS-CoV-2 by FDA under an Emergency Use Authorization (EUA). This EUA will remain in effect (meaning this test can be used) for the duration of the COVID-19 declaration under Section 564(b)(1) of the Act, 21 U.S.C. section 360bbb-3(b)(1), unless the authorization is terminated or revoked.     Resp Syncytial Virus by PCR NEGATIVE NEGATIVE Final     Comment: (NOTE) Fact Sheet for Patients: BloggerCourse.com  Fact Sheet for Healthcare Providers: SeriousBroker.it  This test is not yet approved or cleared by the [place] FDA and has been authorized for detection and/or diagnosis of SARS-CoV-2 by FDA under an Emergency Use Authorization (EUA). This EUA will remain in effect (meaning this test can be used) for the duration of the COVID-19 declaration under Section 564(b)(1) of the Act, 21 U.S.C. section 360bbb-3(b)(1), unless the authorization is terminated or revoked.  Performed at [facility], [address], [place], [place] [address]          Radiology Studies: No results found.      Scheduled Meds:  amiodarone  200 mg Oral Daily   apixaban  2.5 mg Oral BID   diltiazem  300 mg Oral Daily   fluticasone furoate-vilanterol  1 puff Inhalation Daily   And   umeclidinium bromide  1 puff Inhalation Daily   furosemide  40 mg Oral Daily   guaiFENesin  1,200 mg Oral BID   insulin aspart  0-15 Units Subcutaneous TID AC & HS   metoprolol succinate  25 mg Oral Daily   sodium chloride flush  3 mL Intravenous Q12H   sodium chloride flush  3 mL Intravenous Q12H   traZODone  100 mg Oral QHS   Continuous Infusions:  sodium chloride 250 mL (03/23/23 2223)   sodium chloride Stopped (03/25/23 0352)     LOS: 8 days    Time spent: 25 mins     Charise Killian, MD [facility] Pager [phone]  If 7PM-7AM, please contact night-coverage [email or web address] 03/29/2023, 9:01 AM

## 2023-03-29 NOTE — Progress Notes (Signed)
Occupational Therapy Treatment Patient Details Name: Leslie Parker MRN: [id number] DOB: 1932-10-10 Today's Date: 03/29/2023   History of present illness Pt is an 89 year old female presenting with hypoxia, admitted with CHF vs PNA     PMH significant for ongestive heart failure, hypertension, asthma, A-fib presenting with shortness of breath.  Patient recently had hip surgery (ORIF, 01/2023) and was discharged from rehab (approx two weeks prior to this admission).   OT comments  Pt seen for OT tx this date and agreeable. Pt required MIN A For RLE mgt for sup>sit EOB. CGA for repeated STS transfers with RW during LB dressing. Pt required MIN A to complete doffing and initiate donning of clean brief over feet. Pt educated in compensatory strategies for improving self mgt of bladder incontinence. Pt verbalized understanding. Pt left in recliner, alarm set, all needs in reach. Pt continues to progress, continue to recommend OT Services to maximize return to PLOF.       If plan is discharge home, recommend the following:  A little help with walking and/or transfers;A little help with bathing/dressing/bathroom;Direct supervision/assist for financial management;Assistance with cooking/housework;Direct supervision/assist for medications management;Assist for transportation;Help with stairs or ramp for entrance   Equipment Recommendations  None recommended by OT    Recommendations for Other Services      Precautions / Restrictions Precautions Precautions: Fall Restrictions Weight Bearing Restrictions: No       Mobility Bed Mobility Overal bed mobility: Needs Assistance Bed Mobility: Supine to Sit     Supine to sit: Min assist     General bed mobility comments: MIN A For RLE mgt to EOB    Transfers Overall transfer level: Needs assistance Equipment used: Rolling walker (2 wheels) Transfers: Sit to/from Stand Sit to Stand: Supervision, Contact guard assist           General  transfer comment: repeated STS transfers EOB with RW during LB dressing and incontinence mgt     Balance Overall balance assessment: Needs assistance Sitting-balance support: No upper extremity supported, Feet supported Sitting balance-[name]: Good     Standing balance support: During functional activity, Bilateral upper extremity supported, Reliant on assistive device for balance, Single extremity supported Standing balance-[name]: Fair                             ADL either performed or assessed with clinical judgement   ADL Overall ADL's : Needs assistance/impaired                     Lower Body Dressing: Sit to/from stand;Minimal assistance Lower Body Dressing Details (indicate cue type and reason): MIN A to thread underwear over feet to doff/don 2/2 RLE strength limitations, CGA in standing with RW for UE support                    Extremity/Trunk Assessment              Vision       Perception     Praxis      Cognition Arousal: Alert Behavior During Therapy: WFL for tasks assessed/performed Overall Cognitive Status: Within Functional Limits for tasks assessed                                          Exercises Other Exercises Other  Exercises: Pt educated in compensatory strategies for improving self mgt of bladder incontinence    Shoulder Instructions       General Comments      Pertinent Vitals/ Pain       Pain Assessment Pain Assessment: No/denies pain  Home Living                                          Prior Functioning/Environment              Frequency  Min 1X/week        Progress Toward Goals  OT Goals(current goals can now be found in the care plan section)  Progress towards OT goals: Progressing toward goals  Acute Rehab OT Goals Patient Stated Goal: go home OT Goal Formulation: With patient Time For Goal Achievement: 04/04/23 Potential to Achieve  Goals: Good  Plan      Co-evaluation                 AM-PAC OT "6 Clicks" Daily Activity     Outcome Measure   Help from another person eating meals?: None Help from another person taking care of personal grooming?: None Help from another person toileting, which includes using toliet, bedpan, or urinal?: A Little Help from another person bathing (including washing, rinsing, drying)?: A Little Help from another person to put on and taking off regular upper body clothing?: A Little Help from another person to put on and taking off regular lower body clothing?: A Little 6 Click Score: 20    End of Session Equipment Utilized During Treatment: Rolling walker (2 wheels)  OT Visit Diagnosis: Other abnormalities of gait and mobility (R26.89);Muscle weakness (generalized) (M62.81)   Activity Tolerance Patient tolerated treatment well   Patient Left in chair;with call bell/phone within reach;with chair alarm set   Nurse Communication          Time: 1610-9604 OT Time Calculation (min): 12 min  Charges: OT General Charges $OT Visit: 1 Visit OT Treatments $Self Care/Home Management : 8-22 mins  Arman Filter., MPH, MS, OTR/L ascom [phone] 03/29/23, 1:35 PM

## 2023-03-30 DIAGNOSIS — I5033 Acute on chronic diastolic (congestive) heart failure: Secondary | ICD-10-CM | POA: Diagnosis not present

## 2023-03-30 LAB — CBC
HCT: 29.1 % — ABNORMAL LOW (ref 36.0–46.0)
Hemoglobin: 9.5 g/dL — ABNORMAL LOW (ref 12.0–15.0)
MCH: 31 pg (ref 26.0–34.0)
MCHC: 32.6 g/dL (ref 30.0–36.0)
MCV: 95.1 fL (ref 80.0–100.0)
Platelets: 222 10*3/uL (ref 150–400)
RBC: 3.06 MIL/uL — ABNORMAL LOW (ref 3.87–5.11)
RDW: 13.9 % (ref 11.5–15.5)
WBC: 5.5 10*3/uL (ref 4.0–10.5)
nRBC: 0 % (ref 0.0–0.2)

## 2023-03-30 LAB — BASIC METABOLIC PANEL
Anion gap: 9 (ref 5–15)
BUN: 31 mg/dL — ABNORMAL HIGH (ref 8–23)
CO2: 25 mmol/L (ref 22–32)
Calcium: 7.9 mg/dL — ABNORMAL LOW (ref 8.9–10.3)
Chloride: 105 mmol/L (ref 98–111)
Creatinine, Ser: 1.66 mg/dL — ABNORMAL HIGH (ref 0.44–1.00)
GFR, Estimated: 29 mL/min — ABNORMAL LOW (ref >60)
Glucose, Bld: 118 mg/dL — ABNORMAL HIGH (ref 70–99)
Potassium: 3.8 mmol/L (ref 3.5–5.1)
Sodium: 139 mmol/L (ref 135–145)

## 2023-03-30 LAB — GLUCOSE, CAPILLARY
Glucose-Capillary: 125 mg/dL — ABNORMAL HIGH (ref 70–99)
Glucose-Capillary: 134 mg/dL — ABNORMAL HIGH (ref 70–99)
Glucose-Capillary: 177 mg/dL — ABNORMAL HIGH (ref 70–99)
Glucose-Capillary: 137 mg/dL — ABNORMAL HIGH (ref 70–99)

## 2023-03-30 NOTE — Progress Notes (Signed)
PROGRESS NOTE    Leslie Parker  VWU:[id number] DOB: Nov 23, 1932 DOA: 03/20/2023 PCP: [name], MD  Assessment & Plan:   Principal Problem:   Shortness of breath Active Problems:   Acute on chronic diastolic CHF (congestive heart failure) (HCC)   Essential hypertension   COPD (chronic obstructive pulmonary disease) (HCC)   Hypertension   Asthma, chronic   Hypomagnesemia   Diabetes mellitus without complication (HCC)   CKD stage 3 due to type 2 diabetes mellitus (HCC)   Paroxysmal atrial fibrillation (HCC)   Abnormal LFTs   Anemia  Assessment and Plan: Acute hypoxic respiratory failure: etiology unclear. Supplemental oxygen has since been weaned off. Saturating in mid 90s on RA. Resolved  Possible pneumonia: completed abx course w/ azithromycin, rocephin. Continue bronchodilators & encourage incentive spirometry. CXR showed multifocal nodular opacity in the left lower lobe, possibly endobronchial. The largest nodule measures 11 mm. 26-month follow-up recommended to ensure resolution.   Acute on chronic diastolic CHF: continue on lasix, metoprolol. Monitor I/Os   Chronic a. fib: continue on amio, metoprolol, diltiazem, eliquis   HTN: continue amio, metoprolol, diltiazem    DM2: well controlled, HbA1c 5.8. Continue on SSI w/ accuchecks    COPD: w/o exacerbation. Bronchodilators prn   Physical deconditioning: PT/OT recs HH   Sacral pressure injury: present on admission. Continue w/ wound care    AKI: Cr is trending down again today   Hypokalemia: WNL today   Normocytic anemia: H&H are labile. Will transfuse if Hb < 7.0     DVT prophylaxis: eliquis Code Status: full  Family Communication:  Disposition Plan: likely d/c back home w/ family care    Level of care: Med-Surg Status is: Inpatient Remains inpatient appropriate because: medically stable but no one at home can care for pt currently. Pt's daughter comes on 03/31/23 at night as per pt and pt will live w/  daughter then. Will d/c home on 04/01/23 morning     Consultants:    Procedures:   Antimicrobials:   Subjective: Pt c/o fatigue   Objective: Vitals:   03/29/23 1247 03/29/23 1713 03/29/23 2118 03/30/23 0500  BP: (!) 147/93 (!) 148/69 (!) 150/60   Pulse: (!) 59 (!) 59 60   Resp: 17 18 20    Temp: 98.5 F (36.9 C) 99.6 F (37.6 C) 99 F (37.2 C)   TempSrc: Oral     SpO2: 93% 96% 94%   Weight:    85 kg  Height:        Intake/Output Summary (Last 24 hours) at 03/30/2023 0800 Last data filed at 03/29/2023 1017 Gross per 24 hour  Intake 240 ml  Output --  Net 240 ml   Filed Weights   03/21/23 0641 03/24/23 0701 03/30/23 0500  Weight: 90.4 kg 85.6 kg 85 kg    Examination:  General exam: appears comfortable  Respiratory system: decreased breath sounds b/l  Cardiovascular system: S1/S2+. No rubs or clicks  Gastrointestinal system: abd is soft, NT, ND & normal bowel sounds  Central nervous system: alert & oriented. Moves all extremities  Psychiatry: judgement and insight appears normal. Appropriate mood and affect     Data Reviewed: I have personally reviewed following labs and imaging studies  CBC: Recent Labs  Lab 03/28/23 0429 03/29/23 0421 03/30/23 0517  WBC 5.3 5.5 5.5  HGB 9.6* 9.8* 9.5*  HCT 29.2* 31.1* 29.1*  MCV 93.3 95.7 95.1  PLT 223 231 222   Basic Metabolic Panel: Recent Labs  Lab 03/23/23 0953  03/24/23 0908 03/25/23 0959 03/28/23 0429 03/29/23 0421 03/30/23 0517  NA 135 135 135 137 139 139  K 4.0 3.6 3.7 3.2* 3.4* 3.8  CL 102 100 100 100 104 105  CO2 22 24 24 28 26 25   GLUCOSE 143* 123* 155* 129* 121* 118*  BUN 38* 38* 35* 35* 34* 31*  CREATININE 1.66* 1.46* 1.42* 1.68* 1.68* 1.66*  CALCIUM 8.0* 8.0* 8.6* 7.9* 8.0* 7.9*  MG 1.7  --   --   --   --   --    GFR: Estimated Creatinine Clearance: 27.3 mL/min (A) (by C-G formula based on SCr of 1.66 mg/dL (H)). Liver Function Tests: No results for input(s): "AST", "ALT", "ALKPHOS",  "BILITOT", "PROT", "ALBUMIN" in the last 168 hours.  No results for input(s): "LIPASE", "AMYLASE" in the last 168 hours. No results for input(s): "AMMONIA" in the last 168 hours. Coagulation Profile: No results for input(s): "INR", "PROTIME" in the last 168 hours. Cardiac Enzymes: No results for input(s): "CKTOTAL", "CKMB", "CKMBINDEX", "TROPONINI" in the last 168 hours. BNP (last 3 results) No results for input(s): "PROBNP" in the last 8760 hours. HbA1C: No results for input(s): "HGBA1C" in the last 72 hours. CBG: Recent Labs  Lab 03/28/23 2112 03/29/23 0820 03/29/23 1250 03/29/23 2115 03/30/23 0740  GLUCAP 118* 115* 147* 151* 125*   Lipid Profile: No results for input(s): "CHOL", "HDL", "LDLCALC", "TRIG", "CHOLHDL", "LDLDIRECT" in the last 72 hours. Thyroid Function Tests: No results for input(s): "TSH", "T4TOTAL", "FREET4", "T3FREE", "THYROIDAB" in the last 72 hours. Anemia Panel: No results for input(s): "VITAMINB12", "FOLATE", "FERRITIN", "TIBC", "IRON", "RETICCTPCT" in the last 72 hours. Sepsis Labs: No results for input(s): "PROCALCITON", "LATICACIDVEN" in the last 168 hours.   Recent Results (from the past 240 hour(s))  Resp panel by RT-PCR (RSV, Flu A&B, Covid) Anterior Nasal Swab     Status: None   Collection Time: 03/20/23  8:26 PM   Specimen: Anterior Nasal Swab  Result Value Ref Range Status   SARS Coronavirus 2 by RT PCR NEGATIVE NEGATIVE Final    Comment: (NOTE) SARS-CoV-2 target nucleic acids are NOT DETECTED.  The SARS-CoV-2 RNA is generally detectable in upper respiratory specimens during the acute phase of infection. The lowest concentration of SARS-CoV-2 viral copies this assay can detect is 138 copies/mL. A negative result does not preclude SARS-Cov-2 infection and should not be used as the sole basis for treatment or other patient management decisions. A negative result may occur with  improper specimen collection/handling, submission of specimen  other than nasopharyngeal swab, presence of viral mutation(s) within the areas targeted by this assay, and inadequate number of viral copies(<138 copies/mL). A negative result must be combined with clinical observations, patient history, and epidemiological information. The expected result is Negative.  Fact Sheet for Patients:  BloggerCourse.com  Fact Sheet for Healthcare Providers:  SeriousBroker.it  This test is no t yet approved or cleared by the [place] FDA and  has been authorized for detection and/or diagnosis of SARS-CoV-2 by FDA under an Emergency Use Authorization (EUA). This EUA will remain  in effect (meaning this test can be used) for the duration of the COVID-19 declaration under Section 564(b)(1) of the Act, 21 U.S.C.section 360bbb-3(b)(1), unless the authorization is terminated  or revoked sooner.       Influenza A by PCR NEGATIVE NEGATIVE Final   Influenza B by PCR NEGATIVE NEGATIVE Final    Comment: (NOTE) The Xpert Xpress SARS-CoV-2/FLU/RSV plus assay is intended as an aid in the  diagnosis of influenza from Nasopharyngeal swab specimens and should not be used as a sole basis for treatment. Nasal washings and aspirates are unacceptable for Xpert Xpress SARS-CoV-2/FLU/RSV testing.  Fact Sheet for Patients: BloggerCourse.com  Fact Sheet for Healthcare Providers: SeriousBroker.it  This test is not yet approved or cleared by the [place] FDA and has been authorized for detection and/or diagnosis of SARS-CoV-2 by FDA under an Emergency Use Authorization (EUA). This EUA will remain in effect (meaning this test can be used) for the duration of the COVID-19 declaration under Section 564(b)(1) of the Act, 21 U.S.C. section 360bbb-3(b)(1), unless the authorization is terminated or revoked.     Resp Syncytial Virus by PCR NEGATIVE NEGATIVE Final     Comment: (NOTE) Fact Sheet for Patients: BloggerCourse.com  Fact Sheet for Healthcare Providers: SeriousBroker.it  This test is not yet approved or cleared by the [place] FDA and has been authorized for detection and/or diagnosis of SARS-CoV-2 by FDA under an Emergency Use Authorization (EUA). This EUA will remain in effect (meaning this test can be used) for the duration of the COVID-19 declaration under Section 564(b)(1) of the Act, 21 U.S.C. section 360bbb-3(b)(1), unless the authorization is terminated or revoked.  Performed at [facility], [address], [place], [place] [address]          Radiology Studies: No results found.      Scheduled Meds:  amiodarone  200 mg Oral Daily   apixaban  2.5 mg Oral BID   diltiazem  300 mg Oral Daily   fluticasone furoate-vilanterol  1 puff Inhalation Daily   And   umeclidinium bromide  1 puff Inhalation Daily   furosemide  40 mg Oral Daily   guaiFENesin  1,200 mg Oral BID   insulin aspart  0-15 Units Subcutaneous TID AC & HS   metoprolol succinate  25 mg Oral Daily   sodium chloride flush  3 mL Intravenous Q12H   sodium chloride flush  3 mL Intravenous Q12H   traZODone  100 mg Oral QHS   Continuous Infusions:  sodium chloride 250 mL (03/23/23 2223)   sodium chloride Stopped (03/25/23 0352)     LOS: 9 days      Charise Killian, MD [facility] Pager [phone]  If 7PM-7AM, please contact night-coverage [email or web address] 03/30/2023, 8:00 AM

## 2023-03-31 DIAGNOSIS — I5033 Acute on chronic diastolic (congestive) heart failure: Secondary | ICD-10-CM | POA: Diagnosis not present

## 2023-03-31 LAB — CBC
HCT: 30.1 % — ABNORMAL LOW (ref 36.0–46.0)
Hemoglobin: 9.6 g/dL — ABNORMAL LOW (ref 12.0–15.0)
MCH: 30.2 pg (ref 26.0–34.0)
MCHC: 31.9 g/dL (ref 30.0–36.0)
MCV: 94.7 fL (ref 80.0–100.0)
Platelets: 241 10*3/uL (ref 150–400)
RBC: 3.18 MIL/uL — ABNORMAL LOW (ref 3.87–5.11)
RDW: 13.8 % (ref 11.5–15.5)
WBC: 6 10*3/uL (ref 4.0–10.5)
nRBC: 0 % (ref 0.0–0.2)

## 2023-03-31 LAB — BASIC METABOLIC PANEL
Anion gap: 9 (ref 5–15)
BUN: 39 mg/dL — ABNORMAL HIGH (ref 8–23)
CO2: 26 mmol/L (ref 22–32)
Calcium: 8.1 mg/dL — ABNORMAL LOW (ref 8.9–10.3)
Chloride: 104 mmol/L (ref 98–111)
Creatinine, Ser: 1.52 mg/dL — ABNORMAL HIGH (ref 0.44–1.00)
GFR, Estimated: 33 mL/min — ABNORMAL LOW (ref >60)
Glucose, Bld: 95 mg/dL (ref 70–99)
Potassium: 3.6 mmol/L (ref 3.5–5.1)
Sodium: 139 mmol/L (ref 135–145)

## 2023-03-31 LAB — GLUCOSE, CAPILLARY
Glucose-Capillary: 114 mg/dL — ABNORMAL HIGH (ref 70–99)
Glucose-Capillary: 159 mg/dL — ABNORMAL HIGH (ref 70–99)
Glucose-Capillary: 213 mg/dL — ABNORMAL HIGH (ref 70–99)
Glucose-Capillary: 90 mg/dL (ref 70–99)

## 2023-03-31 NOTE — Progress Notes (Signed)
PROGRESS NOTE    Leslie Parker  WUJ:[id number] DOB: 07-29-1932 DOA: 03/20/2023 PCP: [name], MD  Assessment & Plan:   Principal Problem:   Shortness of breath Active Problems:   Acute on chronic diastolic CHF (congestive heart failure) (HCC)   Essential hypertension   COPD (chronic obstructive pulmonary disease) (HCC)   Hypertension   Asthma, chronic   Hypomagnesemia   Diabetes mellitus without complication (HCC)   CKD stage 3 due to type 2 diabetes mellitus (HCC)   Paroxysmal atrial fibrillation (HCC)   Abnormal LFTs   Anemia  Assessment and Plan: Acute hypoxic respiratory failure: etiology unclear. Supplemental oxygen has since been weaned off. Saturating in mid 90s on RA. Resolved   Possible pneumonia: completed abx course w/ azithromycin, rocephin. Continue bronchodilators & encourage incentive spirometry. CXR showed multifocal nodular opacity in the left lower lobe, possibly endobronchial. The largest nodule measures 11 mm. 55-month follow-up recommended to ensure resolution.   Acute on chronic diastolic CHF: continue on metoprolol, lasix. Monitor I/Os   Chronic a. fib: continue on metoprolol, amio, diltiazem, eliquis    HTN: continue on diltiazem, amio, metoprolol    DM2: well controlled, HbA1c 5.8. Continue on SSI w/ accuchecks    COPD: w/o exacerbation. Bronchodilators prn   Physical deconditioning: PT/OT recs HH   Sacral pressure injury: present on admission. Continue w/ wound care   AKI: Cr is trending down daily   Hypokalemia: WNL today    Normocytic anemia: H&H are labile. No need for a transfusion currently     DVT prophylaxis: eliquis Code Status: full  Family Communication:  Disposition Plan: likely d/c back home w/ family care    Level of care: Med-Surg Status is: Inpatient Remains inpatient appropriate because: medically stable but no one at home can care for pt currently. Pt's daughter comes on 03/31/23 at night as per pt and pt will  live w/ daughter then. Will d/c home on 04/01/23 morning     Consultants:    Procedures:   Antimicrobials:   Subjective: Pt c/o malaise  Objective: Vitals:   03/30/23 0910 03/30/23 1524 03/31/23 0155 03/31/23 0500  BP: (!) 165/63 (!) 160/64 (!) 154/63   Pulse: 70 (!) 58 (!) 59   Resp: 18 16 18    Temp: 98.6 F (37 C) 98.5 F (36.9 C) 98 F (36.7 C)   TempSrc:      SpO2: 96% 97% 95%   Weight:    85.2 kg  Height:        Intake/Output Summary (Last 24 hours) at 03/31/2023 0810 Last data filed at 03/31/2023 0554 Gross per 24 hour  Intake 600 ml  Output 1000 ml  Net -400 ml   Filed Weights   03/24/23 0701 03/30/23 0500 03/31/23 0500  Weight: 85.6 kg 85 kg 85.2 kg    Examination:  General exam: appears calm & comfortable  Respiratory system: diminished breath sounds b/l  Cardiovascular system: S1 & S2+. No rubs or clicks  Gastrointestinal system: abd is soft, NT, ND & hypoactive bowel sounds   Central nervous system: alert & oriented. Moves all extremities  Psychiatry: judgement and insight appears at baseline. Flat mood and affect    Data Reviewed: I have personally reviewed following labs and imaging studies  CBC: Recent Labs  Lab 03/28/23 0429 03/29/23 0421 03/30/23 0517 03/31/23 0316  WBC 5.3 5.5 5.5 6.0  HGB 9.6* 9.8* 9.5* 9.6*  HCT 29.2* 31.1* 29.1* 30.1*  MCV 93.3 95.7 95.1 94.7  PLT 223  231 222 241   Basic Metabolic Panel: Recent Labs  Lab 03/25/23 0959 03/28/23 0429 03/29/23 0421 03/30/23 0517 03/31/23 0316  NA 135 137 139 139 139  K 3.7 3.2* 3.4* 3.8 3.6  CL 100 100 104 105 104  CO2 24 28 26 25 26   GLUCOSE 155* 129* 121* 118* 95  BUN 35* 35* 34* 31* 39*  CREATININE 1.42* 1.68* 1.68* 1.66* 1.52*  CALCIUM 8.6* 7.9* 8.0* 7.9* 8.1*   GFR: Estimated Creatinine Clearance: 29.8 mL/min (A) (by C-G formula based on SCr of 1.52 mg/dL (H)). Liver Function Tests: No results for input(s): "AST", "ALT", "ALKPHOS", "BILITOT", "PROT", "ALBUMIN" in  the last 168 hours.  No results for input(s): "LIPASE", "AMYLASE" in the last 168 hours. No results for input(s): "AMMONIA" in the last 168 hours. Coagulation Profile: No results for input(s): "INR", "PROTIME" in the last 168 hours. Cardiac Enzymes: No results for input(s): "CKTOTAL", "CKMB", "CKMBINDEX", "TROPONINI" in the last 168 hours. BNP (last 3 results) No results for input(s): "PROBNP" in the last 8760 hours. HbA1C: No results for input(s): "HGBA1C" in the last 72 hours. CBG: Recent Labs  Lab 03/30/23 0740 03/30/23 1159 03/30/23 1640 03/30/23 2141 03/31/23 0756  GLUCAP 125* 134* 177* 137* 114*   Lipid Profile: No results for input(s): "CHOL", "HDL", "LDLCALC", "TRIG", "CHOLHDL", "LDLDIRECT" in the last 72 hours. Thyroid Function Tests: No results for input(s): "TSH", "T4TOTAL", "FREET4", "T3FREE", "THYROIDAB" in the last 72 hours. Anemia Panel: No results for input(s): "VITAMINB12", "FOLATE", "FERRITIN", "TIBC", "IRON", "RETICCTPCT" in the last 72 hours. Sepsis Labs: No results for input(s): "PROCALCITON", "LATICACIDVEN" in the last 168 hours.   No results found for this or any previous visit (from the past 240 hour(s)).        Radiology Studies: No results found.      Scheduled Meds:  amiodarone  200 mg Oral Daily   apixaban  2.5 mg Oral BID   diltiazem  300 mg Oral Daily   fluticasone furoate-vilanterol  1 puff Inhalation Daily   And   umeclidinium bromide  1 puff Inhalation Daily   furosemide  40 mg Oral Daily   guaiFENesin  1,200 mg Oral BID   insulin aspart  0-15 Units Subcutaneous TID AC & HS   metoprolol succinate  25 mg Oral Daily   sodium chloride flush  3 mL Intravenous Q12H   sodium chloride flush  3 mL Intravenous Q12H   traZODone  100 mg Oral QHS   Continuous Infusions:  sodium chloride 250 mL (03/23/23 2223)   sodium chloride Stopped (03/25/23 0352)     LOS: 10 days      Charise Killian, MD [facility] Pager  [phone]  If 7PM-7AM, please contact night-coverage [email or web address] 03/31/2023, 8:10 AM

## 2023-04-01 DIAGNOSIS — J189 Pneumonia, unspecified organism: Secondary | ICD-10-CM | POA: Diagnosis not present

## 2023-04-01 LAB — BASIC METABOLIC PANEL
Anion gap: 7 (ref 5–15)
BUN: 37 mg/dL — ABNORMAL HIGH (ref 8–23)
CO2: 26 mmol/L (ref 22–32)
Calcium: 8.1 mg/dL — ABNORMAL LOW (ref 8.9–10.3)
Chloride: 103 mmol/L (ref 98–111)
Creatinine, Ser: 1.65 mg/dL — ABNORMAL HIGH (ref 0.44–1.00)
GFR, Estimated: 30 mL/min — ABNORMAL LOW (ref >60)
Glucose, Bld: 124 mg/dL — ABNORMAL HIGH (ref 70–99)
Potassium: 3.5 mmol/L (ref 3.5–5.1)
Sodium: 136 mmol/L (ref 135–145)

## 2023-04-01 LAB — CBC
HCT: 30.2 % — ABNORMAL LOW (ref 36.0–46.0)
Hemoglobin: 9.7 g/dL — ABNORMAL LOW (ref 12.0–15.0)
MCH: 30.5 pg (ref 26.0–34.0)
MCHC: 32.1 g/dL (ref 30.0–36.0)
MCV: 95 fL (ref 80.0–100.0)
Platelets: 239 10*3/uL (ref 150–400)
RBC: 3.18 MIL/uL — ABNORMAL LOW (ref 3.87–5.11)
RDW: 13.6 % (ref 11.5–15.5)
WBC: 6.3 10*3/uL (ref 4.0–10.5)
nRBC: 0 % (ref 0.0–0.2)

## 2023-04-01 LAB — GLUCOSE, CAPILLARY
Glucose-Capillary: 199 mg/dL — ABNORMAL HIGH (ref 70–99)
Glucose-Capillary: 132 mg/dL — ABNORMAL HIGH (ref 70–99)

## 2023-04-01 MED ORDER — ISOSORBIDE MONONITRATE ER 30 MG PO TB24
15.0000 mg | ORAL_TABLET | Freq: Once | ORAL | Status: AC
  Administered 2023-04-01: 15 mg via ORAL
  Filled 2023-04-01: qty 1

## 2023-04-01 NOTE — Care Management Important Message (Signed)
Important Message  Patient Details  Name: Leslie Parker MRN: [id number] Date of Birth: 27-Oct-1932   Medicare Important Message Given:  Yes     [name] A [name] 04/01/2023, 10:36 AM

## 2023-04-01 NOTE — Discharge Summary (Signed)
Physician Discharge Summary  AEON TOFTE ZOX:[id number] DOB: May 02, 1933 DOA: 03/20/2023  PCP: [name], MD  Admit date: 03/20/2023 Discharge date: 04/01/2023  Admitted From: home  Disposition:  home w/ home health   Recommendations for Outpatient Follow-up:  Follow up with PCP in 1-2 weeks Needs repeat CXR in 10-month follow-up recommended to ensure resolution.   Home Health: yes Equipment/Devices:  Discharge Condition: stable  CODE STATUS: full  Diet recommendation: Heart Healthy / Carb Modified   Brief/Interim Summary: HPI was taken from Dr. Irena Cords:  Leslie Parker is a 89 y.o. female with medical history significant for congestive heart failure, hypertension, asthma, A-fib presenting with shortness of breath.  Patient recently had hip surgery and was discharged from rehab she is not on home oxygen and she is Coming in with hypoxia with O2 sats of 88% on room air patient's cough is clear what no reports of fevers or chills.  Patient states she lives alone but has been feeling weak.  No reports of chest pain palpitations.  Patient does report nausea but no vomiting.  Patient has been having swelling in both her legs. In the emergency room she is alert awake oriented afebrile is dyspneic and has auditory rales at bedside that we can hear without the stethoscope.  On oxygen currently at 2 L nasal cannula and is stable not in distress. Blood work in the emergency room is abnormal showing hyponatremia of 133, CKD stage IV with a creatinine at 1.94 and EGFR of 24.  LFTs are abnormal with ALT of 122 AST of 63 alk phos of 312, BNP of 224.5. CBC abnormal with a white count of 10.8 hemoglobin of 11 normal neutrophil count platelet counts are 271.  Respiratory panel is negative for influenza RSV and COVID, in the emergency room patient received 20 mg of Lasix IV. Chest x-ray done today shows retrocardiac strandy opacities like atelectasis.  Discharge Diagnoses:  Principal Problem:    Shortness of breath Active Problems:   Acute on chronic diastolic CHF (congestive heart failure) (HCC)   Essential hypertension   COPD (chronic obstructive pulmonary disease) (HCC)   Hypertension   Asthma, chronic   Hypomagnesemia   Diabetes mellitus without complication (HCC)   CKD stage 3 due to type 2 diabetes mellitus (HCC)   Paroxysmal atrial fibrillation (HCC)   Abnormal LFTs   Anemia  Acute hypoxic respiratory failure: etiology unclear. Supplemental oxygen has since been weaned off. Saturating in mid 90s on RA. Resolved    Possible pneumonia: completed abx course w/ azithromycin, rocephin. Continue bronchodilators & encourage incentive spirometry. CXR showed multifocal nodular opacity in the left lower lobe, possibly endobronchial. The largest nodule measures 11 mm. 2-month follow-up recommended to ensure resolution.   Acute on chronic diastolic CHF: continue on metoprolol, lasix. Monitor I/Os    Chronic a. fib: continue on metoprolol, amio, diltiazem, eliquis    HTN: continue on diltiazem, amio, metoprolol    DM2: well controlled, HbA1c 5.8. Continue on SSI w/ accuchecks while inpatient    COPD: w/o exacerbation. Bronchodilators prn    Physical deconditioning: PT/OT recs HH    Sacral pressure injury: present on admission. Continue w/ wound care   AKI: Cr is labile    Hypokalemia: WNL today     Normocytic anemia: H&H are labile. No need for a transfusion currently   Discharge Instructions  Discharge Instructions     Diet - low sodium heart healthy   Complete by: As directed    Diet  Carb Modified   Complete by: As directed    Discharge instructions   Complete by: As directed    F/u w/ PCP in 1-2 weeks.   Discharge wound care:   Complete by: As directed    Every other day    Comments: Cleanse buttock wound with saline, pat dry. Cut to fit silver hydrofiber (Aquacel ag+) [facility] [place] # [id number]. Place over wound bed, top with foam. Change every other day   Increase  activity slowly   Complete by: As directed       Allergies as of 04/01/2023       Reactions   Baclofen Other (See Comments)   Elevated Cr   Simvastatin Other (See Comments)   Pt denies Elevated LFTs with simva 40mg , stopped and resolved (see MD note 11/10/13)        Medication List     STOP taking these medications    metoprolol succinate 25 MG 24 hr tablet Commonly known as: TOPROL-XL       TAKE these medications    albuterol 108 (90 Base) MCG/ACT inhaler Commonly known as: VENTOLIN HFA Inhale 2 puffs into the lungs every 6 (six) hours as needed.   amiodarone 200 MG tablet Commonly known as: PACERONE Take 200 mg by mouth daily.   apixaban 2.5 MG Tabs tablet Commonly known as: ELIQUIS Take 1 tablet (2.5 mg total) by mouth 2 (two) times daily.   ascorbic acid 250 MG Chew Commonly known as: VITAMIN C Chew 250 mg by mouth daily.   B-12 2500 MCG Tabs Take 2,500 mcg by mouth daily.   bisacodyl 10 MG suppository Commonly known as: DULCOLAX Place 1 suppository (10 mg total) rectally daily as needed for moderate constipation.   colestipol 1 g tablet Commonly known as: COLESTID Take 1 tablet by mouth 2 (two) times daily.   diltiazem 360 MG 24 hr capsule Commonly known as: CARDIZEM CD Take 360 mg by mouth daily.   docusate sodium 100 MG capsule Commonly known as: COLACE Take 1 capsule (100 mg total) by mouth 2 (two) times daily.   feeding supplement Liqd Take 237 mLs by mouth 2 (two) times daily between meals.   furosemide 20 MG tablet Commonly known as: LASIX Take 20 mg by mouth daily.   glipiZIDE 2.5 MG 24 hr tablet Commonly known as: GLUCOTROL XL Take 2.5 mg by mouth daily with breakfast.   ipratropium-albuterol 0.5-2.5 (3) MG/3ML Soln Commonly known as: DUONEB Inhale 3 mLs into the lungs every 6 (six) hours as needed.   Magnesium Oxide -Mg Supplement 500 MG Caps Take 500 mg by mouth daily.   methocarbamol 500 MG tablet Commonly known as:  ROBAXIN Take 1 tablet (500 mg total) by mouth every 8 (eight) hours as needed for muscle spasms.   metoprolol tartrate 25 MG tablet Commonly known as: LOPRESSOR Take 1 tablet (25 mg total) by mouth 2 (two) times daily.   multivitamin with minerals Tabs tablet Take 1 tablet by mouth daily. One-A-Day Women's Vitamin   omeprazole 20 MG capsule Commonly known as: PRILOSEC Take 20 mg by mouth every morning.   oxyCODONE-acetaminophen 5-325 MG tablet Commonly known as: PERCOCET/ROXICET Take 1 tablet by mouth every 4 (four) hours as needed for moderate pain.   traZODone 100 MG tablet Commonly known as: DESYREL Take 100 mg by mouth at bedtime.   Trelegy Ellipta 100-62.5-25 MCG/ACT Aepb Generic drug: Fluticasone-Umeclidin-Vilant Inhale 1 puff into the lungs daily.   Vitamin D-1000 Max St 25 MCG (1000 UT)  tablet Generic drug: Cholecalciferol Take 1,000 Units by mouth daily.               Discharge Care Instructions  (From admission, onward)           Start     Ordered   04/01/23 0000  Discharge wound care:       Comments: Every other day    Comments: Cleanse buttock wound with saline, pat dry. Cut to fit silver hydrofiber (Aquacel ag+) [facility] [place] # [id number]. Place over wound bed, top with foam. Change every other day   04/01/23 0911            Allergies  Allergen Reactions   Baclofen Other (See Comments)    Elevated Cr   Simvastatin Other (See Comments)    Pt denies  Elevated LFTs with simva 40mg , stopped and resolved (see MD note 11/10/13)    Consultations:    Procedures/Studies: DG Chest Port 1 View  Result Date: 03/25/2023 CLINICAL DATA:  Short of breath. EXAM: PORTABLE CHEST 1 VIEW COMPARISON:  None Available. FINDINGS: LEFT-sided pacer overlies normal cardiac silhouette. Low lung volumes. Small LEFT effusion. No pneumothorax or consolidation. RIGHT shoulder arthroplasty IMPRESSION: 1. Low lung volumes. 2. Small LEFT effusion. Electronically Signed   By:  Genevive Bi M.D.   On: 03/25/2023 16:06   US Venous Img Lower Bilateral (DVT)  Result Date: 03/21/2023 CLINICAL DATA:  Bilateral lower extremity pain and edema for the past week. Shortness of breath. Pneumonia. Evaluate for DVT. EXAM: BILATERAL LOWER EXTREMITY VENOUS DOPPLER ULTRASOUND TECHNIQUE: Gray-scale sonography with graded compression, as well as color Doppler and duplex ultrasound were performed to evaluate the lower extremity deep venous systems from the level of the common femoral vein and including the common femoral, femoral, profunda femoral, popliteal and calf veins including the posterior tibial, peroneal and gastrocnemius veins when visible. The superficial great saphenous vein was also interrogated. Spectral Doppler was utilized to evaluate flow at rest and with distal augmentation maneuvers in the common femoral, femoral and popliteal veins. COMPARISON:  None Available. FINDINGS: Examination is degraded due to patient body habitus and poor sonographic window. RIGHT LOWER EXTREMITY Common Femoral Vein: No evidence of thrombus. Normal compressibility, respiratory phasicity and response to augmentation. Saphenofemoral Junction: No evidence of thrombus. Normal compressibility and flow on color Doppler imaging. Profunda Femoral Vein: No evidence of thrombus. Normal compressibility and flow on color Doppler imaging. Femoral Vein: No evidence of thrombus. Normal compressibility, respiratory phasicity and response to augmentation. Popliteal Vein: No evidence of thrombus. Normal compressibility, respiratory phasicity and response to augmentation. Calf Veins: No evidence of thrombus. Normal compressibility and flow on color Doppler imaging. Superficial Great Saphenous Vein: No evidence of thrombus. Normal compressibility. Other Findings:  None. LEFT LOWER EXTREMITY Common Femoral Vein: No evidence of thrombus. Normal compressibility, respiratory phasicity and response to augmentation. Saphenofemoral  Junction: No evidence of thrombus. Normal compressibility and flow on color Doppler imaging. Profunda Femoral Vein: No evidence of thrombus. Normal compressibility and flow on color Doppler imaging. Femoral Vein: No evidence of thrombus. Normal compressibility, respiratory phasicity and response to augmentation. Popliteal Vein: No evidence of thrombus. Normal compressibility, respiratory phasicity and response to augmentation. Calf Veins: No evidence of thrombus. Normal compressibility and flow on color Doppler imaging. Superficial Great Saphenous Vein: No evidence of thrombus. Normal compressibility. Other Findings:  None. IMPRESSION: No evidence of DVT within either lower extremity. Electronically Signed   By: Simonne Come M.D.   On: 03/21/2023 16:21   CT  CHEST WO CONTRAST  Result Date: 03/21/2023 CLINICAL DATA:  Abnormal x-ray.  Left basilar opacity. EXAM: CT CHEST WITHOUT CONTRAST TECHNIQUE: Multidetector CT imaging of the chest was performed following the standard protocol without IV contrast. RADIATION DOSE REDUCTION: This exam was performed according to the departmental dose-optimization program which includes automated exposure control, adjustment of the mA and/or kV according to patient size and/or use of iterative reconstruction technique. COMPARISON:  Chest radiograph 03/20/2023 Chest CT 08/23/2022 FINDINGS: Cardiovascular: Calcific atherosclerosis of the aorta and coronary arteries. No pericardial effusion. Mediastinum/Nodes: 2.3 cm hypodense right thyroid nodule. No mediastinal axillary lymphadenopathy. Lungs/Pleura: Multifocal nodular opacity in the left lower lobe, possibly endobronchial. The largest nodule measures 11 mm (series 4, image 59), new since 08/23/22. There is endobronchial filling at the left lung base. Bibasilar dependent atelectasis. No pleural effusion. Upper Abdomen: Small hiatal hernia. Unchanged appearance of renal cysts (no follow-up imaging required, Bosniak class I).  Musculoskeletal: No chest wall mass or suspicious bone lesions identified. IMPRESSION: 1. Multifocal nodular opacity in the left lower lobe, possibly endobronchial. The largest nodule measures 11 mm. 66-month follow-up recommended to ensure resolution. 2. Incidental right thyroid nodule measuring 2.3 cm. Recommend non-emergent thyroid ultrasound if clinically warranted given patient age. Reference: J Am Coll Radiol. 2015 Feb;12(2): 143-50 3. Small hiatal hernia Aortic Atherosclerosis (ICD10-I70.0). Electronically Signed   By: Deatra Robinson M.D.   On: 03/21/2023 03:47   US ABDOMEN LIMITED RUQ (LIVER/GB)  Result Date: 03/20/2023 CLINICAL DATA:  Elevated LFTs EXAM: ULTRASOUND ABDOMEN LIMITED RIGHT UPPER QUADRANT COMPARISON:  None Available. FINDINGS: Gallbladder: Surgically removed Common bile duct: Diameter: 16.7 mm. This is consistent with the post cholecystectomy state. Mild intrahepatic ductal dilatation is noted as well. Liver: Mild nodularity to the liver is noted consistent with underlying cirrhosis. No focal mass is seen. Portal vein is patent on color Doppler imaging with normal direction of blood flow towards the liver. Other: Note is made of right renal cysts. IMPRESSION: Status post cholecystectomy with compensatory enlargement of the common bile duct. Simple cysts are noted within the right kidney. No follow-up is recommended. Electronically Signed   By: Alcide Clever M.D.   On: 03/20/2023 20:42   DG Chest Port 1 View  Result Date: 03/20/2023 CLINICAL DATA:  Cough and hypoxia EXAM: PORTABLE CHEST 1 VIEW COMPARISON:  Chest x-ray 02/13/2023 FINDINGS: Heart is enlarged, unchanged. There are minimal retrocardiac strandy opacities. Costophrenic angles are clear. No pneumothorax. There are atherosclerotic calcifications of the aorta. There surgical changes in both shoulders. IMPRESSION: 1. Minimal retrocardiac strandy opacities, likely atelectasis. 2. Cardiomegaly. Electronically Signed   By: Darliss Cheney  M.D.   On: 03/20/2023 19:49   (Echo, Carotid, EGD, Colonoscopy, ERCP)    Subjective: Pt denies any complaints    Discharge Exam: Vitals:   04/01/23 0125 04/01/23 0905  BP: (!) 158/79 (!) 185/69  Pulse: 61 66  Resp:  18  Temp:  98.2 F (36.8 C)  SpO2:  97%   Vitals:   03/31/23 1541 04/01/23 0122 04/01/23 0125 04/01/23 0905  BP: 130/84 (!) 159/66 (!) 158/79 (!) 185/69  Pulse: (!) 59 (!) 59 61 66  Resp: 18 16  18   Temp: 98.4 F (36.9 C) 98.2 F (36.8 C)  98.2 F (36.8 C)  TempSrc:      SpO2: 98% 99%  97%  Weight:      Height:        General: Pt is alert, awake, not in acute distress Cardiovascular: S1/S2 +, no rubs,  no gallops Respiratory: CTA bilaterally, no wheezing, no rhonchi Abdominal: Soft, NT, ND, bowel sounds + Extremities: no cyanosis    The results of significant diagnostics from this hospitalization (including imaging, microbiology, ancillary and laboratory) are listed below for reference.     Microbiology: No results found for this or any previous visit (from the past 240 hour(s)).   Labs: BNP (last 3 results) Recent Labs    11/05/22 1638 02/13/23 0953 03/20/23 1732  BNP 391.3* 350.5* 224.5*   Basic Metabolic Panel: Recent Labs  Lab 03/28/23 0429 03/29/23 0421 03/30/23 0517 03/31/23 0316 04/01/23 0434  NA 137 139 139 139 136  K 3.2* 3.4* 3.8 3.6 3.5  CL 100 104 105 104 103  CO2 28 26 25 26 26   GLUCOSE 129* 121* 118* 95 124*  BUN 35* 34* 31* 39* 37*  CREATININE 1.68* 1.68* 1.66* 1.52* 1.65*  CALCIUM 7.9* 8.0* 7.9* 8.1* 8.1*   Liver Function Tests: No results for input(s): "AST", "ALT", "ALKPHOS", "BILITOT", "PROT", "ALBUMIN" in the last 168 hours. No results for input(s): "LIPASE", "AMYLASE" in the last 168 hours. No results for input(s): "AMMONIA" in the last 168 hours. CBC: Recent Labs  Lab 03/28/23 0429 03/29/23 0421 03/30/23 0517 03/31/23 0316 04/01/23 0434  WBC 5.3 5.5 5.5 6.0 6.3  HGB 9.6* 9.8* 9.5* 9.6* 9.7*  HCT  29.2* 31.1* 29.1* 30.1* 30.2*  MCV 93.3 95.7 95.1 94.7 95.0  PLT 223 231 222 241 239   Cardiac Enzymes: No results for input(s): "CKTOTAL", "CKMB", "CKMBINDEX", "TROPONINI" in the last 168 hours. BNP: Invalid input(s): "POCBNP" CBG: Recent Labs  Lab 03/31/23 0756 03/31/23 1156 03/31/23 1618 03/31/23 2114 04/01/23 0827  GLUCAP 114* 159* 213* 90 132*   D-Dimer No results for input(s): "DDIMER" in the last 72 hours. Hgb A1c No results for input(s): "HGBA1C" in the last 72 hours. Lipid Profile No results for input(s): "CHOL", "HDL", "LDLCALC", "TRIG", "CHOLHDL", "LDLDIRECT" in the last 72 hours. Thyroid function studies No results for input(s): "TSH", "T4TOTAL", "T3FREE", "THYROIDAB" in the last 72 hours.  Invalid input(s): "FREET3" Anemia work up No results for input(s): "VITAMINB12", "FOLATE", "FERRITIN", "TIBC", "IRON", "RETICCTPCT" in the last 72 hours. Urinalysis    Component Value Date/Time   COLORURINE AMBER (A) 11/24/2022 2239   APPEARANCEUR CLOUDY (A) 11/24/2022 2239   APPEARANCEUR Clear 02/19/2013 0254   LABSPEC 1.016 11/24/2022 2239   LABSPEC 1.006 02/19/2013 0254   PHURINE 5.0 11/24/2022 2239   GLUCOSEU NEGATIVE 11/24/2022 2239   GLUCOSEU Negative 02/19/2013 0254   HGBUR NEGATIVE 11/24/2022 2239   BILIRUBINUR NEGATIVE 11/24/2022 2239   BILIRUBINUR Negative 02/19/2013 0254   KETONESUR 5 (A) 11/24/2022 2239   PROTEINUR 100 (A) 11/24/2022 2239   NITRITE NEGATIVE 11/24/2022 2239   LEUKOCYTESUR MODERATE (A) 11/24/2022 2239   LEUKOCYTESUR 1+ 02/19/2013 0254   Sepsis Labs Recent Labs  Lab 03/29/23 0421 03/30/23 0517 03/31/23 0316 04/01/23 0434  WBC 5.5 5.5 6.0 6.3   Microbiology No results found for this or any previous visit (from the past 240 hour(s)).   Time coordinating discharge: Over 30 minutes  SIGNED:   Charise Killian, MD  [facility] 04/01/2023, 9:11 AM Pager   If 7PM-7AM, please contact night-coverage [email or web address]

## 2023-04-01 NOTE — TOC Progression Note (Signed)
Transition of Care [facility]) - Progression Note    Patient Details  Name: Leslie Parker MRN: [id number] Date of Birth: 1933/07/04  [facility] CM/SW Contact  Hetty Ely, RN Phone Number: 04/01/2023, 9:54 AM  Clinical Narrative:   CM contacted [facility], who will resume HHPT/OT however will call back to confirm SN for wound care. MD and RN notified, will keep them updated.    Expected Discharge Plan: Home/Self Care Barriers to Discharge: Continued Medical Work up  Expected Discharge Plan and Services       Living arrangements for the past 2 months: Single Family Home Expected Discharge Date: 04/01/23                                     Social Determinants of Health (SDOH) Interventions SDOH Screenings   Food Insecurity: No Food Insecurity (03/21/2023)  Housing: Low Risk  (03/21/2023)  Transportation Needs: No Transportation Needs (03/21/2023)  Utilities: Not At Risk (03/21/2023)  Tobacco Use: Low Risk  (03/20/2023)    Readmission Risk Interventions    03/21/2023    1:12 PM 08/27/2022   12:05 PM  Readmission Risk Prevention Plan  Transportation Screening Complete Complete  PCP or Specialist Appt within 3-5 Days Complete Complete  HRI or Home Care Consult Complete Complete  Social Work Consult for Recovery Care Planning/Counseling Complete   Palliative Care Screening Not Applicable   Medication Review Oceanographer) Complete Complete

## 2023-04-01 NOTE — Progress Notes (Signed)
Discharge instructions reviewed with patient and daughter. They verbalized understanding of discharge instructions. PIV removed prior to discharge

## 2023-04-01 NOTE — Group Note (Deleted)

## 2023-04-03 ENCOUNTER — Ambulatory Visit: Payer: Self-pay | Admitting: *Deleted

## 2023-04-03 NOTE — Patient Outreach (Signed)
  Care Coordination   Follow Up Visit Note   04/03/2023 Name: Leslie Parker MRN: [id number] DOB: 1933/02/17  Leslie Parker is a 89 y.o. year old female who sees [name], MD for primary care. I spoke with  [name] by phone today.  What matters to the patients health and wellness today?  Readmitted to hospital for COPD/PNE, working to stay out of hospital    Goals Addressed             This Visit's Progress    Free from recurrent fall   On track    Care Coordination Interventions: Provided written and verbal education re: potential causes of falls and Fall prevention strategies Reviewed medications and discussed potential side effects of medications such as dizziness and frequent urination Advised patient of importance of notifying provider of falls Assessed for falls since last encounter Assessed patients knowledge of fall risk prevention secondary to previously provided education      Management of COPD   On track      Interventions Today    Flowsheet Row Most Recent Value  Chronic Disease   Chronic disease during today's visit Other, Chronic Obstructive Pulmonary Disease (COPD)  [fall]  General Interventions   General Interventions Discussed/Reviewed General Interventions Reviewed, Communication with, Doctor Visits, Durable Medical Equipment (DME)  Doctor Visits Discussed/Reviewed Doctor Visits Reviewed  [Pulmonary and PCP on 9/16, HF on 9/24.  [facility] now has orders to resume services, will call for home visit]  Durable Medical Equipment (DME) Wheelchair  [No longer wants wheelchair due to cost, does have walker]  Wheelchair Standard  PCP/Specialist Visits Compliance with follow-up visit  Communication with PCP/Specialists  [Calls to [facility], PCP, and ortho to collaborate for appts]  Education Interventions   Education Provided Provided Education  Provided Verbal Education On When to see the doctor, [facility], Medication  [Using Trelegy as  directed, daughter in the home from , will remain until patient has improved]              SDOH assessments and interventions completed:  No     Care Coordination Interventions:  Yes, provided   Follow up plan: Follow up call scheduled for 10/1    Encounter Outcome:  Patient Visit Completed   Kemper Durie, RN, MSN, [facility] Care Management Care Management Coordinator [phone]

## 2023-04-05 DIAGNOSIS — L89616 Pressure-induced deep tissue damage of right heel: Secondary | ICD-10-CM | POA: Diagnosis not present

## 2023-04-05 DIAGNOSIS — Z7984 Long term (current) use of oral hypoglycemic drugs: Secondary | ICD-10-CM | POA: Diagnosis not present

## 2023-04-05 DIAGNOSIS — Z96641 Presence of right artificial hip joint: Secondary | ICD-10-CM | POA: Diagnosis not present

## 2023-04-05 DIAGNOSIS — I10 Essential (primary) hypertension: Secondary | ICD-10-CM | POA: Diagnosis not present

## 2023-04-05 DIAGNOSIS — Z9181 History of falling: Secondary | ICD-10-CM | POA: Diagnosis not present

## 2023-04-05 DIAGNOSIS — S72141D Displaced intertrochanteric fracture of right femur, subsequent encounter for closed fracture with routine healing: Secondary | ICD-10-CM | POA: Diagnosis not present

## 2023-04-05 DIAGNOSIS — L89313 Pressure ulcer of right buttock, stage 3: Secondary | ICD-10-CM | POA: Diagnosis not present

## 2023-04-05 DIAGNOSIS — E119 Type 2 diabetes mellitus without complications: Secondary | ICD-10-CM | POA: Diagnosis not present

## 2023-04-08 DIAGNOSIS — L89616 Pressure-induced deep tissue damage of right heel: Secondary | ICD-10-CM | POA: Diagnosis not present

## 2023-04-08 DIAGNOSIS — E119 Type 2 diabetes mellitus without complications: Secondary | ICD-10-CM | POA: Diagnosis not present

## 2023-04-08 DIAGNOSIS — L89313 Pressure ulcer of right buttock, stage 3: Secondary | ICD-10-CM | POA: Diagnosis not present

## 2023-04-08 DIAGNOSIS — Z7984 Long term (current) use of oral hypoglycemic drugs: Secondary | ICD-10-CM | POA: Diagnosis not present

## 2023-04-08 DIAGNOSIS — S72141D Displaced intertrochanteric fracture of right femur, subsequent encounter for closed fracture with routine healing: Secondary | ICD-10-CM | POA: Diagnosis not present

## 2023-04-08 DIAGNOSIS — Z9181 History of falling: Secondary | ICD-10-CM | POA: Diagnosis not present

## 2023-04-08 DIAGNOSIS — I10 Essential (primary) hypertension: Secondary | ICD-10-CM | POA: Diagnosis not present

## 2023-04-08 DIAGNOSIS — Z96641 Presence of right artificial hip joint: Secondary | ICD-10-CM | POA: Diagnosis not present

## 2023-04-09 DIAGNOSIS — Z7984 Long term (current) use of oral hypoglycemic drugs: Secondary | ICD-10-CM | POA: Diagnosis not present

## 2023-04-09 DIAGNOSIS — I10 Essential (primary) hypertension: Secondary | ICD-10-CM | POA: Diagnosis not present

## 2023-04-09 DIAGNOSIS — L89616 Pressure-induced deep tissue damage of right heel: Secondary | ICD-10-CM | POA: Diagnosis not present

## 2023-04-09 DIAGNOSIS — Z96641 Presence of right artificial hip joint: Secondary | ICD-10-CM | POA: Diagnosis not present

## 2023-04-09 DIAGNOSIS — Z9181 History of falling: Secondary | ICD-10-CM | POA: Diagnosis not present

## 2023-04-09 DIAGNOSIS — L89313 Pressure ulcer of right buttock, stage 3: Secondary | ICD-10-CM | POA: Diagnosis not present

## 2023-04-09 DIAGNOSIS — E119 Type 2 diabetes mellitus without complications: Secondary | ICD-10-CM | POA: Diagnosis not present

## 2023-04-09 DIAGNOSIS — S72141D Displaced intertrochanteric fracture of right femur, subsequent encounter for closed fracture with routine healing: Secondary | ICD-10-CM | POA: Diagnosis not present

## 2023-04-12 ENCOUNTER — Emergency Department: Payer: Medicare HMO

## 2023-04-12 ENCOUNTER — Other Ambulatory Visit: Payer: Self-pay

## 2023-04-12 ENCOUNTER — Emergency Department
Admission: EM | Admit: 2023-04-12 | Discharge: 2023-04-12 | Disposition: A | Discharge status: Home / Self Care | Payer: Medicare HMO | Attending: Emergency Medicine | Admitting: Emergency Medicine

## 2023-04-12 DIAGNOSIS — K5900 Constipation, unspecified: Secondary | ICD-10-CM | POA: Insufficient documentation

## 2023-04-12 DIAGNOSIS — J449 Chronic obstructive pulmonary disease, unspecified: Secondary | ICD-10-CM | POA: Insufficient documentation

## 2023-04-12 DIAGNOSIS — J45909 Unspecified asthma, uncomplicated: Secondary | ICD-10-CM | POA: Diagnosis not present

## 2023-04-12 DIAGNOSIS — E119 Type 2 diabetes mellitus without complications: Secondary | ICD-10-CM | POA: Diagnosis not present

## 2023-04-12 DIAGNOSIS — Z950 Presence of cardiac pacemaker: Secondary | ICD-10-CM | POA: Insufficient documentation

## 2023-04-12 DIAGNOSIS — R918 Other nonspecific abnormal finding of lung field: Secondary | ICD-10-CM | POA: Diagnosis not present

## 2023-04-12 DIAGNOSIS — I10 Essential (primary) hypertension: Secondary | ICD-10-CM | POA: Diagnosis not present

## 2023-04-12 DIAGNOSIS — J189 Pneumonia, unspecified organism: Secondary | ICD-10-CM | POA: Diagnosis not present

## 2023-04-12 DIAGNOSIS — R110 Nausea: Secondary | ICD-10-CM | POA: Diagnosis not present

## 2023-04-12 DIAGNOSIS — Z96611 Presence of right artificial shoulder joint: Secondary | ICD-10-CM | POA: Diagnosis not present

## 2023-04-12 DIAGNOSIS — R112 Nausea with vomiting, unspecified: Secondary | ICD-10-CM | POA: Diagnosis not present

## 2023-04-12 DIAGNOSIS — R059 Cough, unspecified: Secondary | ICD-10-CM | POA: Diagnosis not present

## 2023-04-12 LAB — COMPREHENSIVE METABOLIC PANEL
ALT: 150 U/L — ABNORMAL HIGH (ref 0–44)
AST: 146 U/L — ABNORMAL HIGH (ref 15–41)
Albumin: 3.4 g/dL — ABNORMAL LOW (ref 3.5–5.0)
Alkaline Phosphatase: 330 U/L — ABNORMAL HIGH (ref 38–126)
Anion gap: 14 (ref 5–15)
BUN: 32 mg/dL — ABNORMAL HIGH (ref 8–23)
CO2: 23 mmol/L (ref 22–32)
Calcium: 8.6 mg/dL — ABNORMAL LOW (ref 8.9–10.3)
Chloride: 101 mmol/L (ref 98–111)
Creatinine, Ser: 1.68 mg/dL — ABNORMAL HIGH (ref 0.44–1.00)
GFR, Estimated: 29 mL/min — ABNORMAL LOW (ref >60)
Glucose, Bld: 136 mg/dL — ABNORMAL HIGH (ref 70–99)
Potassium: 3.2 mmol/L — ABNORMAL LOW (ref 3.5–5.1)
Sodium: 138 mmol/L (ref 135–145)
Total Bilirubin: 1.2 mg/dL (ref 0.3–1.2)
Total Protein: 6.7 g/dL (ref 6.5–8.1)

## 2023-04-12 LAB — CBC
HCT: 36.6 % (ref 36.0–46.0)
Hemoglobin: 11.7 g/dL — ABNORMAL LOW (ref 12.0–15.0)
MCH: 29.8 pg (ref 26.0–34.0)
MCHC: 32 g/dL (ref 30.0–36.0)
MCV: 93.4 fL (ref 80.0–100.0)
Platelets: 240 10*3/uL (ref 150–400)
RBC: 3.92 MIL/uL (ref 3.87–5.11)
RDW: 13.7 % (ref 11.5–15.5)
WBC: 5.4 10*3/uL (ref 4.0–10.5)
nRBC: 0 % (ref 0.0–0.2)

## 2023-04-12 LAB — LIPASE, BLOOD: Lipase: 23 U/L (ref 11–51)

## 2023-04-12 MED ORDER — POLYETHYLENE GLYCOL 3350 17 GM/SCOOP PO POWD: ORAL | 1 refills | Status: DC

## 2023-04-12 MED ORDER — BISACODYL 10 MG RE SUPP: 10.0000 mg | Freq: Every day | RECTAL | Status: DC | PRN

## 2023-04-12 NOTE — ED Notes (Signed)
HOB was raised for patient's comfort. Patient appears short of breath at rest. Patient states she feels better sitting upright, but is short of breath. PA-C is aware.

## 2023-04-12 NOTE — Discharge Instructions (Signed)
Call make an appointment with your primary care provider and also with your gastroenterologist if you continue having problems with constipation.  Prescription for MiraLAX was sent to the pharmacy with instructions.  Make sure that you drink lots of fluids with this and increase your intake of fruits and vegetables.

## 2023-04-12 NOTE — ED Notes (Signed)
Patient is alert and oriented. Patient was able to transfer to stretcher with two assists. Patient has a congested cough.

## 2023-04-12 NOTE — ED Triage Notes (Signed)
Pt comes via EMS from home with c/o constipation, N.V for week. Pt was seen here in early Sept for pneumonia. Pt has taken stool softener with no relief. 4 zofran give by ems.  Pacemake placed in April BP-188/86 HR-81 O2-94% RA  98.4-temp CBG-136

## 2023-04-12 NOTE — ED Notes (Signed)
Patient is back from imaging.

## 2023-04-12 NOTE — ED Provider Notes (Signed)
[facility] Provider Note    Event Date/Time   First MD Initiated Contact with Patient 04/12/23 1023     (approximate)   History   Constipation   HPI  Leslie Parker is a 89 y.o. female presents to the ED via EMS with complaint of nausea without vomiting.  Patient states that she has been constipated and has been taking stool softeners without any relief.  Patient has history of asthma, atrial fibs, diabetes, hypertension, community-acquired pneumonia, COPD, AKI, pacemaker implant, sick sinus syndrome and abnormal LFTs.     Physical Exam   Triage Vital Signs: ED Triage Vitals  Encounter Vitals Group     BP 04/12/23 0946 (!) 180/69     Systolic BP Percentile --      Diastolic BP Percentile --      Pulse Rate 04/12/23 0946 65     Resp 04/12/23 0946 19     Temp 04/12/23 0946 98.3 F (36.8 C)     Temp Source 04/12/23 0946 Oral     SpO2 04/12/23 0946 94 %     Weight --      Height --      Head Circumference --      Peak Flow --      Pain Score 04/12/23 0934 4     Pain Loc --      Pain Education --      Exclude from Growth Chart --     Most recent vital signs: Vitals:   04/12/23 1345 04/12/23 1511  BP:  (!) 170/70  Pulse: 63 60  Resp:  18  Temp:  98 F (36.7 C)  SpO2: 94% 97%     General: Awake, no distress.  Alert, talkative, answers questions appropriately. CV:  Good peripheral perfusion.  Resp:  Normal effort.  Abd:  No distention. Soft, non-tender, BS normoactive x 4 quads  Other:     ED Results / Procedures / Treatments   Labs (all labs ordered are listed, but only abnormal results are displayed) Labs Reviewed  COMPREHENSIVE METABOLIC PANEL - Abnormal; Notable for the following components:      Result Value   Potassium 3.2 (*)    Glucose, Bld 136 (*)    BUN 32 (*)    Creatinine, Ser 1.68 (*)    Calcium 8.6 (*)    Albumin 3.4 (*)    AST 146 (*)    ALT 150 (*)    Alkaline Phosphatase 330 (*)    GFR, Estimated 29 (*)     All other components within normal limits  CBC - Abnormal; Notable for the following components:   Hemoglobin 11.7 (*)    All other components within normal limits  LIPASE, BLOOD      RADIOLOGY  X-ray images abdomen 1 view shows stool present in the colon however there is no stool burden noted in the rectal area.  Radiology report noted, moderate colonic stool burden without evidence of obstruction.   Chest x-ray images reviewed by myself.  Radiology report shows unchanged left infrahilar airspace opacity which could represent atelectasis or infection.    PROCEDURES:  Critical Care performed:   Procedures   MEDICATIONS ORDERED IN ED: Medications - No data to display   IMPRESSION / MDM / ASSESSMENT AND PLAN / ED COURSE  I reviewed the triage vital signs and the nursing notes.   Differential diagnosis includes, but is not limited to, constipation, obstruction, fecal impaction.  89 year old female presents to  the ED with complaint of constipation for 1 week.  Patient has been taking stool softeners without any relief.  She reports since getting out of the hospital she has been constipated.  She reports that her O2 sat reads 97% at home and she occasionally uses her nebulizer machine and feels that she is getting better from her pneumonia.  Chest x-ray is unchanged and patient is to follow-up with her PCP.  I explained to her that her abdomen shows lots of stool but no impaction for which a enema would help.  A prescription for MiraLAX was sent to the pharmacy for her to begin using twice daily until she has had a bowel movement and then once daily after that time.  She is also strongly encouraged to drink 8 ounces of water with this.  She is to return to the emergency department if any severe worsening of her symptoms.  She also is to follow-up with her gastroenterologist for her elevated  LFTs which she has had in the past.  Patient agrees with this plan.     Patient's  presentation is most consistent with acute illness / injury with system symptoms.  FINAL CLINICAL IMPRESSION(S) / ED DIAGNOSES   Final diagnoses:  Constipation, unspecified constipation type     Rx / DC Orders   ED Discharge Orders          Ordered    polyethylene glycol powder (MIRALAX) 17 GM/SCOOP powder        04/12/23 1505    bisacodyl (DULCOLAX) 10 MG suppository  Daily PRN        04/12/23 1509             Note:  This document was prepared using Dragon voice recognition software and may include unintentional dictation errors.   Tommi Rumps, PA-C 04/12/23 1731    Corena Herter, MD 04/13/23 0800

## 2023-04-12 NOTE — ED Provider Notes (Signed)
Shared visit   Recent hospitalization 1 month ago for pneumonia.  Patient endorses constipation.  Trying a stool softener.  No impaction on exam according to primary provider.  KUB with no signs of a bowel obstruction but signs of significant constipation.  Lab work reassuring.  Chest x-ray with no progression of pneumonia.  Stable on room air.  Discussed MiraLAX cleanout and close follow-up with primary care physician on Monday for reevaluation.  Given return precautions for any worsening symptoms.   Corena Herter, MD 04/12/23 [phone]

## 2023-04-14 DIAGNOSIS — I130 Hypertensive heart and chronic kidney disease with heart failure and stage 1 through stage 4 chronic kidney disease, or unspecified chronic kidney disease: Secondary | ICD-10-CM | POA: Diagnosis not present

## 2023-04-14 DIAGNOSIS — J189 Pneumonia, unspecified organism: Secondary | ICD-10-CM | POA: Diagnosis not present

## 2023-04-14 DIAGNOSIS — D631 Anemia in chronic kidney disease: Secondary | ICD-10-CM | POA: Diagnosis not present

## 2023-04-14 DIAGNOSIS — L89616 Pressure-induced deep tissue damage of right heel: Secondary | ICD-10-CM | POA: Diagnosis not present

## 2023-04-14 DIAGNOSIS — I5033 Acute on chronic diastolic (congestive) heart failure: Secondary | ICD-10-CM | POA: Diagnosis not present

## 2023-04-14 DIAGNOSIS — L89313 Pressure ulcer of right buttock, stage 3: Secondary | ICD-10-CM | POA: Diagnosis not present

## 2023-04-14 DIAGNOSIS — S72141D Displaced intertrochanteric fracture of right femur, subsequent encounter for closed fracture with routine healing: Secondary | ICD-10-CM | POA: Diagnosis not present

## 2023-04-14 DIAGNOSIS — E1122 Type 2 diabetes mellitus with diabetic chronic kidney disease: Secondary | ICD-10-CM | POA: Diagnosis not present

## 2023-04-14 DIAGNOSIS — N1832 Chronic kidney disease, stage 3b: Secondary | ICD-10-CM | POA: Diagnosis not present

## 2023-04-16 ENCOUNTER — Encounter: Payer: Medicare HMO | Admitting: Family

## 2023-04-16 ENCOUNTER — Encounter: Payer: Medicare HMO | Admitting: Cardiology

## 2023-04-17 DIAGNOSIS — J189 Pneumonia, unspecified organism: Secondary | ICD-10-CM | POA: Diagnosis not present

## 2023-04-17 DIAGNOSIS — L89313 Pressure ulcer of right buttock, stage 3: Secondary | ICD-10-CM | POA: Diagnosis not present

## 2023-04-17 DIAGNOSIS — L89616 Pressure-induced deep tissue damage of right heel: Secondary | ICD-10-CM | POA: Diagnosis not present

## 2023-04-17 DIAGNOSIS — E1122 Type 2 diabetes mellitus with diabetic chronic kidney disease: Secondary | ICD-10-CM | POA: Diagnosis not present

## 2023-04-17 DIAGNOSIS — N1832 Chronic kidney disease, stage 3b: Secondary | ICD-10-CM | POA: Diagnosis not present

## 2023-04-17 DIAGNOSIS — I130 Hypertensive heart and chronic kidney disease with heart failure and stage 1 through stage 4 chronic kidney disease, or unspecified chronic kidney disease: Secondary | ICD-10-CM | POA: Diagnosis not present

## 2023-04-17 DIAGNOSIS — D631 Anemia in chronic kidney disease: Secondary | ICD-10-CM | POA: Diagnosis not present

## 2023-04-17 DIAGNOSIS — I5033 Acute on chronic diastolic (congestive) heart failure: Secondary | ICD-10-CM | POA: Diagnosis not present

## 2023-04-17 DIAGNOSIS — S72141D Displaced intertrochanteric fracture of right femur, subsequent encounter for closed fracture with routine healing: Secondary | ICD-10-CM | POA: Diagnosis not present

## 2023-04-18 DIAGNOSIS — I130 Hypertensive heart and chronic kidney disease with heart failure and stage 1 through stage 4 chronic kidney disease, or unspecified chronic kidney disease: Secondary | ICD-10-CM | POA: Diagnosis not present

## 2023-04-18 DIAGNOSIS — J189 Pneumonia, unspecified organism: Secondary | ICD-10-CM | POA: Diagnosis not present

## 2023-04-18 DIAGNOSIS — I5033 Acute on chronic diastolic (congestive) heart failure: Secondary | ICD-10-CM | POA: Diagnosis not present

## 2023-04-18 DIAGNOSIS — L89616 Pressure-induced deep tissue damage of right heel: Secondary | ICD-10-CM | POA: Diagnosis not present

## 2023-04-18 DIAGNOSIS — S72141D Displaced intertrochanteric fracture of right femur, subsequent encounter for closed fracture with routine healing: Secondary | ICD-10-CM | POA: Diagnosis not present

## 2023-04-18 DIAGNOSIS — E1122 Type 2 diabetes mellitus with diabetic chronic kidney disease: Secondary | ICD-10-CM | POA: Diagnosis not present

## 2023-04-18 DIAGNOSIS — L89313 Pressure ulcer of right buttock, stage 3: Secondary | ICD-10-CM | POA: Diagnosis not present

## 2023-04-18 DIAGNOSIS — N1832 Chronic kidney disease, stage 3b: Secondary | ICD-10-CM | POA: Diagnosis not present

## 2023-04-18 DIAGNOSIS — D631 Anemia in chronic kidney disease: Secondary | ICD-10-CM | POA: Diagnosis not present

## 2023-04-22 DIAGNOSIS — E1122 Type 2 diabetes mellitus with diabetic chronic kidney disease: Secondary | ICD-10-CM | POA: Diagnosis not present

## 2023-04-22 DIAGNOSIS — I130 Hypertensive heart and chronic kidney disease with heart failure and stage 1 through stage 4 chronic kidney disease, or unspecified chronic kidney disease: Secondary | ICD-10-CM | POA: Diagnosis not present

## 2023-04-22 DIAGNOSIS — I5033 Acute on chronic diastolic (congestive) heart failure: Secondary | ICD-10-CM | POA: Diagnosis not present

## 2023-04-22 DIAGNOSIS — L89616 Pressure-induced deep tissue damage of right heel: Secondary | ICD-10-CM | POA: Diagnosis not present

## 2023-04-22 DIAGNOSIS — N1832 Chronic kidney disease, stage 3b: Secondary | ICD-10-CM | POA: Diagnosis not present

## 2023-04-22 DIAGNOSIS — L89313 Pressure ulcer of right buttock, stage 3: Secondary | ICD-10-CM | POA: Diagnosis not present

## 2023-04-22 DIAGNOSIS — D631 Anemia in chronic kidney disease: Secondary | ICD-10-CM | POA: Diagnosis not present

## 2023-04-22 DIAGNOSIS — S72141D Displaced intertrochanteric fracture of right femur, subsequent encounter for closed fracture with routine healing: Secondary | ICD-10-CM | POA: Diagnosis not present

## 2023-04-22 DIAGNOSIS — J189 Pneumonia, unspecified organism: Secondary | ICD-10-CM | POA: Diagnosis not present

## 2023-04-23 ENCOUNTER — Ambulatory Visit: Payer: Self-pay | Admitting: *Deleted

## 2023-04-23 NOTE — Patient Outreach (Signed)
Care Coordination   04/23/2023 Name: Leslie Parker MRN: [id number] DOB: 1932/12/20   Care Coordination Outreach Attempts:  An unsuccessful telephone outreach was attempted for a scheduled appointment today.  Follow Up Plan:  Additional outreach attempts will be made to offer the patient care coordination information and services.   Encounter Outcome:  No Answer   Care Coordination Interventions:  No, not indicated    Kemper Durie, RN, MSN, [facility] Care Management Care Management Coordinator [phone]

## 2023-04-24 DIAGNOSIS — J189 Pneumonia, unspecified organism: Secondary | ICD-10-CM | POA: Diagnosis not present

## 2023-04-24 DIAGNOSIS — L89616 Pressure-induced deep tissue damage of right heel: Secondary | ICD-10-CM | POA: Diagnosis not present

## 2023-04-24 DIAGNOSIS — I5033 Acute on chronic diastolic (congestive) heart failure: Secondary | ICD-10-CM | POA: Diagnosis not present

## 2023-04-24 DIAGNOSIS — S72141D Displaced intertrochanteric fracture of right femur, subsequent encounter for closed fracture with routine healing: Secondary | ICD-10-CM | POA: Diagnosis not present

## 2023-04-24 DIAGNOSIS — D631 Anemia in chronic kidney disease: Secondary | ICD-10-CM | POA: Diagnosis not present

## 2023-04-24 DIAGNOSIS — L89313 Pressure ulcer of right buttock, stage 3: Secondary | ICD-10-CM | POA: Diagnosis not present

## 2023-04-24 DIAGNOSIS — I130 Hypertensive heart and chronic kidney disease with heart failure and stage 1 through stage 4 chronic kidney disease, or unspecified chronic kidney disease: Secondary | ICD-10-CM | POA: Diagnosis not present

## 2023-04-24 DIAGNOSIS — N1832 Chronic kidney disease, stage 3b: Secondary | ICD-10-CM | POA: Diagnosis not present

## 2023-04-24 DIAGNOSIS — E1122 Type 2 diabetes mellitus with diabetic chronic kidney disease: Secondary | ICD-10-CM | POA: Diagnosis not present

## 2023-04-25 DIAGNOSIS — D631 Anemia in chronic kidney disease: Secondary | ICD-10-CM | POA: Diagnosis not present

## 2023-04-25 DIAGNOSIS — J189 Pneumonia, unspecified organism: Secondary | ICD-10-CM | POA: Diagnosis not present

## 2023-04-25 DIAGNOSIS — L89313 Pressure ulcer of right buttock, stage 3: Secondary | ICD-10-CM | POA: Diagnosis not present

## 2023-04-25 DIAGNOSIS — S72141D Displaced intertrochanteric fracture of right femur, subsequent encounter for closed fracture with routine healing: Secondary | ICD-10-CM | POA: Diagnosis not present

## 2023-04-25 DIAGNOSIS — L89616 Pressure-induced deep tissue damage of right heel: Secondary | ICD-10-CM | POA: Diagnosis not present

## 2023-04-25 DIAGNOSIS — I5033 Acute on chronic diastolic (congestive) heart failure: Secondary | ICD-10-CM | POA: Diagnosis not present

## 2023-04-25 DIAGNOSIS — I130 Hypertensive heart and chronic kidney disease with heart failure and stage 1 through stage 4 chronic kidney disease, or unspecified chronic kidney disease: Secondary | ICD-10-CM | POA: Diagnosis not present

## 2023-04-25 DIAGNOSIS — E1122 Type 2 diabetes mellitus with diabetic chronic kidney disease: Secondary | ICD-10-CM | POA: Diagnosis not present

## 2023-04-25 DIAGNOSIS — N1832 Chronic kidney disease, stage 3b: Secondary | ICD-10-CM | POA: Diagnosis not present

## 2023-04-30 ENCOUNTER — Inpatient Hospital Stay
Admission: EM | Admit: 2023-04-30 | Discharge: 2023-05-09 | DRG: 291 | Disposition: A | Discharge status: Home Health | Payer: Medicare HMO | Attending: Internal Medicine | Admitting: Internal Medicine

## 2023-04-30 ENCOUNTER — Other Ambulatory Visit: Payer: Self-pay

## 2023-04-30 ENCOUNTER — Inpatient Hospital Stay: Payer: Medicare HMO

## 2023-04-30 ENCOUNTER — Emergency Department: Payer: Medicare HMO

## 2023-04-30 DIAGNOSIS — Z8249 Family history of ischemic heart disease and other diseases of the circulatory system: Secondary | ICD-10-CM | POA: Diagnosis not present

## 2023-04-30 DIAGNOSIS — G4700 Insomnia, unspecified: Secondary | ICD-10-CM | POA: Diagnosis not present

## 2023-04-30 DIAGNOSIS — S72001D Fracture of unspecified part of neck of right femur, subsequent encounter for closed fracture with routine healing: Secondary | ICD-10-CM

## 2023-04-30 DIAGNOSIS — I700 Atherosclerosis of aorta: Secondary | ICD-10-CM | POA: Diagnosis not present

## 2023-04-30 DIAGNOSIS — I5A Non-ischemic myocardial injury (non-traumatic): Secondary | ICD-10-CM | POA: Insufficient documentation

## 2023-04-30 DIAGNOSIS — T501X5A Adverse effect of loop [high-ceiling] diuretics, initial encounter: Secondary | ICD-10-CM | POA: Diagnosis not present

## 2023-04-30 DIAGNOSIS — Z1152 Encounter for screening for COVID-19: Secondary | ICD-10-CM | POA: Diagnosis not present

## 2023-04-30 DIAGNOSIS — E876 Hypokalemia: Secondary | ICD-10-CM | POA: Diagnosis not present

## 2023-04-30 DIAGNOSIS — Z471 Aftercare following joint replacement surgery: Secondary | ICD-10-CM | POA: Diagnosis not present

## 2023-04-30 DIAGNOSIS — R7989 Other specified abnormal findings of blood chemistry: Secondary | ICD-10-CM | POA: Diagnosis present

## 2023-04-30 DIAGNOSIS — T465X6A Underdosing of other antihypertensive drugs, initial encounter: Secondary | ICD-10-CM | POA: Diagnosis present

## 2023-04-30 DIAGNOSIS — Z87412 Personal history of vulvar dysplasia: Secondary | ICD-10-CM

## 2023-04-30 DIAGNOSIS — R410 Disorientation, unspecified: Secondary | ICD-10-CM | POA: Diagnosis not present

## 2023-04-30 DIAGNOSIS — N189 Chronic kidney disease, unspecified: Secondary | ICD-10-CM | POA: Diagnosis not present

## 2023-04-30 DIAGNOSIS — J90 Pleural effusion, not elsewhere classified: Secondary | ICD-10-CM | POA: Diagnosis not present

## 2023-04-30 DIAGNOSIS — Z7951 Long term (current) use of inhaled steroids: Secondary | ICD-10-CM

## 2023-04-30 DIAGNOSIS — N281 Cyst of kidney, acquired: Secondary | ICD-10-CM | POA: Diagnosis not present

## 2023-04-30 DIAGNOSIS — I495 Sick sinus syndrome: Secondary | ICD-10-CM | POA: Diagnosis present

## 2023-04-30 DIAGNOSIS — Z96611 Presence of right artificial shoulder joint: Secondary | ICD-10-CM | POA: Diagnosis present

## 2023-04-30 DIAGNOSIS — M25552 Pain in left hip: Secondary | ICD-10-CM | POA: Diagnosis not present

## 2023-04-30 DIAGNOSIS — I2489 Other forms of acute ischemic heart disease: Secondary | ICD-10-CM | POA: Diagnosis not present

## 2023-04-30 DIAGNOSIS — Z96653 Presence of artificial knee joint, bilateral: Secondary | ICD-10-CM | POA: Diagnosis present

## 2023-04-30 DIAGNOSIS — E1122 Type 2 diabetes mellitus with diabetic chronic kidney disease: Secondary | ICD-10-CM | POA: Diagnosis present

## 2023-04-30 DIAGNOSIS — E871 Hypo-osmolality and hyponatremia: Secondary | ICD-10-CM | POA: Insufficient documentation

## 2023-04-30 DIAGNOSIS — Z950 Presence of cardiac pacemaker: Secondary | ICD-10-CM | POA: Diagnosis not present

## 2023-04-30 DIAGNOSIS — J9601 Acute respiratory failure with hypoxia: Secondary | ICD-10-CM | POA: Diagnosis not present

## 2023-04-30 DIAGNOSIS — E663 Overweight: Secondary | ICD-10-CM | POA: Diagnosis not present

## 2023-04-30 DIAGNOSIS — I5033 Acute on chronic diastolic (congestive) heart failure: Secondary | ICD-10-CM | POA: Diagnosis not present

## 2023-04-30 DIAGNOSIS — Z7984 Long term (current) use of oral hypoglycemic drugs: Secondary | ICD-10-CM | POA: Diagnosis not present

## 2023-04-30 DIAGNOSIS — I440 Atrioventricular block, first degree: Secondary | ICD-10-CM | POA: Diagnosis present

## 2023-04-30 DIAGNOSIS — N179 Acute kidney failure, unspecified: Secondary | ICD-10-CM | POA: Diagnosis not present

## 2023-04-30 DIAGNOSIS — J441 Chronic obstructive pulmonary disease with (acute) exacerbation: Secondary | ICD-10-CM | POA: Diagnosis present

## 2023-04-30 DIAGNOSIS — Z825 Family history of asthma and other chronic lower respiratory diseases: Secondary | ICD-10-CM

## 2023-04-30 DIAGNOSIS — Z96642 Presence of left artificial hip joint: Secondary | ICD-10-CM | POA: Diagnosis present

## 2023-04-30 DIAGNOSIS — K5730 Diverticulosis of large intestine without perforation or abscess without bleeding: Secondary | ICD-10-CM | POA: Diagnosis not present

## 2023-04-30 DIAGNOSIS — R918 Other nonspecific abnormal finding of lung field: Secondary | ICD-10-CM | POA: Diagnosis not present

## 2023-04-30 DIAGNOSIS — M8580 Other specified disorders of bone density and structure, unspecified site: Secondary | ICD-10-CM | POA: Diagnosis present

## 2023-04-30 DIAGNOSIS — Z23 Encounter for immunization: Secondary | ICD-10-CM

## 2023-04-30 DIAGNOSIS — I130 Hypertensive heart and chronic kidney disease with heart failure and stage 1 through stage 4 chronic kidney disease, or unspecified chronic kidney disease: Principal | ICD-10-CM | POA: Diagnosis present

## 2023-04-30 DIAGNOSIS — Z79899 Other long term (current) drug therapy: Secondary | ICD-10-CM

## 2023-04-30 DIAGNOSIS — E1121 Type 2 diabetes mellitus with diabetic nephropathy: Secondary | ICD-10-CM | POA: Diagnosis not present

## 2023-04-30 DIAGNOSIS — I10 Essential (primary) hypertension: Secondary | ICD-10-CM | POA: Diagnosis present

## 2023-04-30 DIAGNOSIS — N1832 Chronic kidney disease, stage 3b: Secondary | ICD-10-CM | POA: Diagnosis present

## 2023-04-30 DIAGNOSIS — I480 Paroxysmal atrial fibrillation: Secondary | ICD-10-CM | POA: Diagnosis present

## 2023-04-30 DIAGNOSIS — Z91148 Patient's other noncompliance with medication regimen for other reason: Secondary | ICD-10-CM

## 2023-04-30 DIAGNOSIS — M7062 Trochanteric bursitis, left hip: Secondary | ICD-10-CM | POA: Diagnosis not present

## 2023-04-30 DIAGNOSIS — Z9049 Acquired absence of other specified parts of digestive tract: Secondary | ICD-10-CM

## 2023-04-30 DIAGNOSIS — R0602 Shortness of breath: Secondary | ICD-10-CM | POA: Diagnosis not present

## 2023-04-30 DIAGNOSIS — Z90710 Acquired absence of both cervix and uterus: Secondary | ICD-10-CM

## 2023-04-30 DIAGNOSIS — N183 Chronic kidney disease, stage 3 unspecified: Secondary | ICD-10-CM | POA: Diagnosis not present

## 2023-04-30 DIAGNOSIS — Z7901 Long term (current) use of anticoagulants: Secondary | ICD-10-CM | POA: Diagnosis not present

## 2023-04-30 DIAGNOSIS — W19XXXA Unspecified fall, initial encounter: Principal | ICD-10-CM

## 2023-04-30 DIAGNOSIS — Z85828 Personal history of other malignant neoplasm of skin: Secondary | ICD-10-CM

## 2023-04-30 DIAGNOSIS — Z888 Allergy status to other drugs, medicaments and biological substances status: Secondary | ICD-10-CM

## 2023-04-30 DIAGNOSIS — Z6828 Body mass index (BMI) 28.0-28.9, adult: Secondary | ICD-10-CM

## 2023-04-30 DIAGNOSIS — E1129 Type 2 diabetes mellitus with other diabetic kidney complication: Secondary | ICD-10-CM | POA: Diagnosis present

## 2023-04-30 DIAGNOSIS — J449 Chronic obstructive pulmonary disease, unspecified: Secondary | ICD-10-CM | POA: Diagnosis present

## 2023-04-30 LAB — CBC
HCT: 39.6 % (ref 36.0–46.0)
Hemoglobin: 13.6 g/dL (ref 12.0–15.0)
MCH: 30.1 pg (ref 26.0–34.0)
MCHC: 34.3 g/dL (ref 30.0–36.0)
MCV: 87.6 fL (ref 80.0–100.0)
Platelets: 247 10*3/uL (ref 150–400)
RBC: 4.52 MIL/uL (ref 3.87–5.11)
RDW: 13.2 % (ref 11.5–15.5)
WBC: 5.9 10*3/uL (ref 4.0–10.5)
nRBC: 0 % (ref 0.0–0.2)

## 2023-04-30 LAB — TROPONIN I (HIGH SENSITIVITY)
Troponin I (High Sensitivity): 51 ng/L — ABNORMAL HIGH (ref <18)
Troponin I (High Sensitivity): 68 ng/L — ABNORMAL HIGH (ref <18)
Troponin I (High Sensitivity): 74 ng/L — ABNORMAL HIGH (ref <18)

## 2023-04-30 LAB — MAGNESIUM: Magnesium: 1.2 mg/dL — ABNORMAL LOW (ref 1.7–2.4)

## 2023-04-30 LAB — COMPREHENSIVE METABOLIC PANEL
ALT: 29 U/L (ref 0–44)
AST: 38 U/L (ref 15–41)
Albumin: 3.7 g/dL (ref 3.5–5.0)
Alkaline Phosphatase: 143 U/L — ABNORMAL HIGH (ref 38–126)
Anion gap: 13 (ref 5–15)
BUN: 27 mg/dL — ABNORMAL HIGH (ref 8–23)
CO2: 24 mmol/L (ref 22–32)
Calcium: 8.7 mg/dL — ABNORMAL LOW (ref 8.9–10.3)
Chloride: 100 mmol/L (ref 98–111)
Creatinine, Ser: 1.5 mg/dL — ABNORMAL HIGH (ref 0.44–1.00)
GFR, Estimated: 33 mL/min — ABNORMAL LOW (ref >60)
Glucose, Bld: 114 mg/dL — ABNORMAL HIGH (ref 70–99)
Potassium: 3.1 mmol/L — ABNORMAL LOW (ref 3.5–5.1)
Sodium: 137 mmol/L (ref 135–145)
Total Bilirubin: 1.1 mg/dL (ref 0.3–1.2)
Total Protein: 7.1 g/dL (ref 6.5–8.1)

## 2023-04-30 LAB — LIPASE, BLOOD: Lipase: 20 U/L (ref 11–51)

## 2023-04-30 LAB — SARS CORONAVIRUS 2 BY RT PCR: SARS Coronavirus 2 by RT PCR: NEGATIVE

## 2023-04-30 LAB — BRAIN NATRIURETIC PEPTIDE: B Natriuretic Peptide: 748.3 pg/mL — ABNORMAL HIGH (ref 0.0–100.0)

## 2023-04-30 MED ORDER — ONDANSETRON HCL 4 MG/2ML IJ SOLN: 4.0000 mg | Freq: Four times a day (QID) | INTRAMUSCULAR | Status: DC | PRN

## 2023-04-30 MED ORDER — VITAMIN C 500 MG PO TABS
250.0000 mg | ORAL_TABLET | Freq: Every day | ORAL | Status: DC
  Administered 2023-05-01 – 2023-05-09 (×9): 250 mg via ORAL
  Filled 2023-04-30: qty 0.5
  Filled 2023-04-30 (×9): qty 1

## 2023-04-30 MED ORDER — SODIUM CHLORIDE 0.9 % IV SOLN
3.0000 g | Freq: Three times a day (TID) | INTRAVENOUS | Status: DC
  Administered 2023-04-30 – 2023-05-01 (×3): 3 g via INTRAVENOUS
  Filled 2023-04-30 (×4): qty 8

## 2023-04-30 MED ORDER — DILTIAZEM HCL ER COATED BEADS 180 MG PO CP24
360.0000 mg | ORAL_CAPSULE | Freq: Every day | ORAL | Status: DC
  Administered 2023-04-30 – 2023-05-09 (×10): 360 mg via ORAL
  Filled 2023-04-30 (×12): qty 2

## 2023-04-30 MED ORDER — OXYCODONE-ACETAMINOPHEN 5-325 MG PO TABS
1.0000 | ORAL_TABLET | ORAL | Status: DC | PRN
  Administered 2023-05-01 – 2023-05-03 (×4): 1 via ORAL
  Filled 2023-04-30 (×4): qty 1

## 2023-04-30 MED ORDER — VITAMIN D 25 MCG (1000 UNIT) PO TABS
1000.0000 [IU] | ORAL_TABLET | Freq: Every day | ORAL | Status: DC
  Administered 2023-05-01 – 2023-05-09 (×9): 1000 [IU] via ORAL
  Filled 2023-04-30 (×9): qty 1

## 2023-04-30 MED ORDER — ENSURE ENLIVE PO LIQD
237.0000 mL | Freq: Two times a day (BID) | ORAL | Status: DC
  Administered 2023-05-01: 237 mL via ORAL

## 2023-04-30 MED ORDER — ONDANSETRON 4 MG PO TBDP
4.0000 mg | ORAL_TABLET | Freq: Once | ORAL | Status: AC
  Administered 2023-04-30: 4 mg via ORAL
  Filled 2023-04-30: qty 1

## 2023-04-30 MED ORDER — ADULT MULTIVITAMIN W/MINERALS CH
1.0000 | ORAL_TABLET | Freq: Every day | ORAL | Status: DC
  Administered 2023-05-01 – 2023-05-09 (×9): 1 via ORAL
  Filled 2023-04-30 (×9): qty 1

## 2023-04-30 MED ORDER — TRAZODONE HCL 100 MG PO TABS
100.0000 mg | ORAL_TABLET | Freq: Every day | ORAL | Status: DC
  Administered 2023-04-30 – 2023-05-08 (×9): 100 mg via ORAL
  Filled 2023-04-30 (×9): qty 1

## 2023-04-30 MED ORDER — HYDRALAZINE HCL 20 MG/ML IJ SOLN
5.0000 mg | Freq: Once | INTRAMUSCULAR | Status: AC
  Administered 2023-04-30: 5 mg via INTRAVENOUS
  Filled 2023-04-30: qty 1

## 2023-04-30 MED ORDER — POLYETHYLENE GLYCOL 3350 17 G PO PACK
17.0000 g | PACK | Freq: Every day | ORAL | Status: DC | PRN
  Administered 2023-05-05 – 2023-05-06 (×2): 17 g via ORAL
  Filled 2023-04-30 (×2): qty 1

## 2023-04-30 MED ORDER — MAGNESIUM OXIDE -MG SUPPLEMENT 400 (240 MG) MG PO TABS
400.0000 mg | ORAL_TABLET | Freq: Every day | ORAL | Status: DC
  Administered 2023-05-01 – 2023-05-09 (×9): 400 mg via ORAL
  Filled 2023-04-30 (×9): qty 1

## 2023-04-30 MED ORDER — HYDRALAZINE HCL 20 MG/ML IJ SOLN: 5.0000 mg | Freq: Four times a day (QID) | INTRAMUSCULAR | Status: DC | PRN

## 2023-04-30 MED ORDER — PANTOPRAZOLE SODIUM 40 MG PO TBEC
40.0000 mg | DELAYED_RELEASE_TABLET | Freq: Every day | ORAL | Status: DC
  Administered 2023-04-30 – 2023-05-09 (×10): 40 mg via ORAL
  Filled 2023-04-30 (×10): qty 1

## 2023-04-30 MED ORDER — IPRATROPIUM-ALBUTEROL 0.5-2.5 (3) MG/3ML IN SOLN
3.0000 mL | Freq: Four times a day (QID) | RESPIRATORY_TRACT | Status: AC
  Administered 2023-04-30 – 2023-05-01 (×3): 3 mL via RESPIRATORY_TRACT
  Filled 2023-04-30 (×3): qty 3

## 2023-04-30 MED ORDER — COLESTIPOL HCL 1 G PO TABS
1.0000 g | ORAL_TABLET | Freq: Two times a day (BID) | ORAL | Status: DC
  Administered 2023-04-30 – 2023-05-09 (×18): 1 g via ORAL
  Filled 2023-04-30 (×19): qty 1

## 2023-04-30 MED ORDER — ACETAMINOPHEN 325 MG PO TABS
650.0000 mg | ORAL_TABLET | Freq: Four times a day (QID) | ORAL | Status: DC | PRN
  Administered 2023-05-02 – 2023-05-04 (×5): 650 mg via ORAL
  Filled 2023-04-30 (×6): qty 2

## 2023-04-30 MED ORDER — ACETAMINOPHEN 650 MG RE SUPP: 650.0000 mg | Freq: Four times a day (QID) | RECTAL | Status: DC | PRN

## 2023-04-30 MED ORDER — APIXABAN 2.5 MG PO TABS
2.5000 mg | ORAL_TABLET | Freq: Two times a day (BID) | ORAL | Status: DC
  Administered 2023-04-30 – 2023-05-09 (×18): 2.5 mg via ORAL
  Filled 2023-04-30 (×19): qty 1

## 2023-04-30 MED ORDER — METOPROLOL TARTRATE 25 MG PO TABS: 25.0000 mg | ORAL_TABLET | Freq: Two times a day (BID) | ORAL | Status: DC

## 2023-04-30 MED ORDER — FUROSEMIDE 10 MG/ML IJ SOLN
40.0000 mg | Freq: Every day | INTRAMUSCULAR | Status: DC
  Administered 2023-05-01: 40 mg via INTRAVENOUS
  Filled 2023-04-30: qty 4

## 2023-04-30 MED ORDER — BISACODYL 10 MG RE SUPP: 10.0000 mg | Freq: Every day | RECTAL | Status: DC | PRN

## 2023-04-30 MED ORDER — FUROSEMIDE 10 MG/ML IJ SOLN: 40.0000 mg | Freq: Once | INTRAMUSCULAR | Status: DC

## 2023-04-30 MED ORDER — IPRATROPIUM-ALBUTEROL 0.5-2.5 (3) MG/3ML IN SOLN
3.0000 mL | Freq: Once | RESPIRATORY_TRACT | Status: AC
  Administered 2023-04-30: 3 mL via RESPIRATORY_TRACT
  Filled 2023-04-30: qty 3

## 2023-04-30 MED ORDER — POTASSIUM CHLORIDE CRYS ER 20 MEQ PO TBCR
40.0000 meq | EXTENDED_RELEASE_TABLET | Freq: Once | ORAL | Status: AC
  Administered 2023-04-30: 40 meq via ORAL
  Filled 2023-04-30: qty 2

## 2023-04-30 MED ORDER — VITAMIN B-12 1000 MCG PO TABS
2500.0000 ug | ORAL_TABLET | Freq: Every day | ORAL | Status: DC
  Administered 2023-05-01 – 2023-05-09 (×9): 2500 ug via ORAL
  Filled 2023-04-30 (×4): qty 3
  Filled 2023-04-30: qty 2.5
  Filled 2023-04-30 (×5): qty 3

## 2023-04-30 MED ORDER — MOMETASONE FURO-FORMOTEROL FUM 200-5 MCG/ACT IN AERO
2.0000 | INHALATION_SPRAY | Freq: Two times a day (BID) | RESPIRATORY_TRACT | Status: DC
  Administered 2023-04-30 – 2023-05-09 (×16): 2 via RESPIRATORY_TRACT
  Filled 2023-04-30 (×2): qty 8.8

## 2023-04-30 MED ORDER — POTASSIUM CHLORIDE 10 MEQ/100ML IV SOLN
10.0000 meq | INTRAVENOUS | Status: AC
  Filled 2023-04-30: qty 100

## 2023-04-30 MED ORDER — FUROSEMIDE 10 MG/ML IJ SOLN
20.0000 mg | Freq: Once | INTRAMUSCULAR | Status: AC
  Administered 2023-04-30: 20 mg via INTRAVENOUS
  Filled 2023-04-30: qty 4

## 2023-04-30 MED ORDER — ONDANSETRON HCL 4 MG PO TABS
4.0000 mg | ORAL_TABLET | Freq: Four times a day (QID) | ORAL | Status: DC | PRN
  Administered 2023-05-03: 4 mg via ORAL
  Filled 2023-04-30: qty 1

## 2023-04-30 MED ORDER — AMIODARONE HCL 200 MG PO TABS
200.0000 mg | ORAL_TABLET | Freq: Every day | ORAL | Status: DC
  Administered 2023-05-01 – 2023-05-09 (×9): 200 mg via ORAL
  Filled 2023-04-30 (×9): qty 1

## 2023-04-30 MED ORDER — DOCUSATE SODIUM 100 MG PO CAPS
100.0000 mg | ORAL_CAPSULE | Freq: Two times a day (BID) | ORAL | Status: DC
  Administered 2023-04-30 – 2023-05-07 (×14): 100 mg via ORAL
  Filled 2023-04-30 (×18): qty 1

## 2023-04-30 MED ORDER — METOPROLOL TARTRATE 25 MG PO TABS
25.0000 mg | ORAL_TABLET | Freq: Two times a day (BID) | ORAL | Status: DC
  Administered 2023-04-30 – 2023-05-09 (×18): 25 mg via ORAL
  Filled 2023-04-30 (×18): qty 1

## 2023-04-30 MED ORDER — POTASSIUM CHLORIDE CRYS ER 20 MEQ PO TBCR
40.0000 meq | EXTENDED_RELEASE_TABLET | Freq: Every day | ORAL | Status: DC
  Administered 2023-05-01: 40 meq via ORAL
  Filled 2023-04-30: qty 2

## 2023-04-30 NOTE — Assessment & Plan Note (Addendum)
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

## 2023-04-30 NOTE — ED Notes (Signed)
EDP made aware of pt's BP in 200s systolic

## 2023-04-30 NOTE — ED Provider Notes (Signed)
[facility] Provider Note    Event Date/Time   First MD Initiated Contact with Patient 04/30/23 1302     (approximate)   History   Hip Pain   HPI  Leslie Parker is a 89 y.o. female  here with hip pain, shortness of breath.  The patient's primary complaint is left-sided hip pain.  The patient states that over the last week, she has had progressively worsening swelling, pain, and an area of fullness in her left hip.  Denies any direct trauma.  She has a history of hip replacement here.  She states it is worse with any kind of movement.  She had a small abrasion over the hip, and states that it had a very small amount of greenish drainage on the bandage, but has had no overt ongoing drainage.  She has had no erythema here.  No fevers.  She states she is otherwise felt generally fatigued, has had some shortness of breath, and has been dealing with a cough for the last week and a half.  She has had some increased swelling in her bilateral legs.  She is felt generally fatigued.  No chest pain.     Physical Exam   Triage Vital Signs: ED Triage Vitals  Encounter Vitals Group     BP 04/30/23 0938 (!) 179/85     Systolic BP Percentile --      Diastolic BP Percentile --      Pulse Rate 04/30/23 0938 77     Resp 04/30/23 0938 17     Temp 04/30/23 0938 98.4 F (36.9 C)     Temp Source 04/30/23 0938 Oral     SpO2 04/30/23 0938 91 %     Weight 04/30/23 0940 187 lb 13.3 oz (85.2 kg)     Height 04/30/23 0940 5\' 9"  (1.753 m)     Head Circumference --      Peak Flow --      Pain Score 04/30/23 0940 0     Pain Loc --      Pain Education --      Exclude from Growth Chart --     Most recent vital signs: Vitals:   04/30/23 1830 04/30/23 1900  BP: (!) 174/101 (!) 180/79  Pulse: 84 82  Resp: 15 14  Temp:    SpO2: 99% 97%     General: Awake, no distress.  CV:  Good peripheral perfusion.  Regular rate and rhythm.  No murmurs. Resp:  Tachypneic, bilateral rales  noted.  Scant wheezes. Abd:  No distention.  No tenderness. Other:  Left lower extremity noted to have a mobile, soft, but tender area of soft tissue swelling just over the left hip/greater trochanter, with an overlying abrasion.  No expressible purulence.  No deep or tracking wounds.  Able to passively range hip 15 to 20 degrees then limited by pain.  2+ pitting edema bilateral lower extremities.  DP pulses are intact bilaterally.   ED Results / Procedures / Treatments   Labs (all labs ordered are listed, but only abnormal results are displayed) Labs Reviewed  COMPREHENSIVE METABOLIC PANEL - Abnormal; Notable for the following components:      Result Value   Potassium 3.1 (*)    Glucose, Bld 114 (*)    BUN 27 (*)    Creatinine, Ser 1.50 (*)    Calcium 8.7 (*)    Alkaline Phosphatase 143 (*)    GFR, Estimated 33 (*)  All other components within normal limits  BRAIN NATRIURETIC PEPTIDE - Abnormal; Notable for the following components:   B Natriuretic Peptide 748.3 (*)    All other components within normal limits  TROPONIN I (HIGH SENSITIVITY) - Abnormal; Notable for the following components:   Troponin I (High Sensitivity) 51 (*)    All other components within normal limits  TROPONIN I (HIGH SENSITIVITY) - Abnormal; Notable for the following components:   Troponin I (High Sensitivity) 68 (*)    All other components within normal limits  SARS CORONAVIRUS 2 BY RT PCR  CBC  LIPASE, BLOOD  BASIC METABOLIC PANEL  CBC  TROPONIN I (HIGH SENSITIVITY)     EKG    RADIOLOGY CXR: Clear DG Hip: no acute fx   I also independently reviewed and agree with radiologist interpretations.   PROCEDURES:  Critical Care performed: No  .1-3 Lead EKG Interpretation  Performed by: Shaune Pollack, MD Authorized by: Shaune Pollack, MD     Interpretation: normal     ECG rate:  60-80   ECG rate assessment: normal     Rhythm: sinus rhythm     Ectopy: none     Conduction: normal    Comments:     Indication: SOB     MEDICATIONS ORDERED IN ED: Medications  potassium chloride 10 mEq in 100 mL IVPB ( Intravenous Canceled Entry 04/30/23 1847)  oxyCODONE-acetaminophen (PERCOCET/ROXICET) 5-325 MG per tablet 1 tablet (has no administration in time range)  amiodarone (PACERONE) tablet 200 mg (has no administration in time range)  colestipol (COLESTID) tablet 1 g (has no administration in time range)  diltiazem (CARDIZEM CD) 24 hr capsule 360 mg (360 mg Oral Given 04/30/23 1928)  traZODone (DESYREL) tablet 100 mg (has no administration in time range)  bisacodyl (DULCOLAX) suppository 10 mg (has no administration in time range)  docusate sodium (COLACE) capsule 100 mg (has no administration in time range)  pantoprazole (PROTONIX) EC tablet 40 mg (40 mg Oral Given 04/30/23 1850)  polyethylene glycol (MIRALAX / GLYCOLAX) packet 17 g (has no administration in time range)  apixaban (ELIQUIS) tablet 2.5 mg (has no administration in time range)  cyanocobalamin (VITAMIN B12) tablet 2,500 mcg (has no administration in time range)  ascorbic acid (VITAMIN C) tablet 250 mg (has no administration in time range)  cholecalciferol (VITAMIN D3) 25 MCG (1000 UNIT) tablet 1,000 Units (has no administration in time range)  feeding supplement (ENSURE ENLIVE / ENSURE PLUS) liquid 237 mL (has no administration in time range)  magnesium oxide (MAG-OX) tablet 400 mg (has no administration in time range)  multivitamin with minerals tablet 1 tablet (has no administration in time range)  ipratropium-albuterol (DUONEB) 0.5-2.5 (3) MG/3ML nebulizer solution 3 mL (has no administration in time range)  mometasone-formoterol (DULERA) 200-5 MCG/ACT inhaler 2 puff (has no administration in time range)  furosemide (LASIX) injection 40 mg (has no administration in time range)  potassium chloride SA (KLOR-CON M) CR tablet 40 mEq (has no administration in time range)  acetaminophen (TYLENOL) tablet 650 mg (has no  administration in time range)    Or  acetaminophen (TYLENOL) suppository 650 mg (has no administration in time range)  ondansetron (ZOFRAN) tablet 4 mg (has no administration in time range)    Or  ondansetron (ZOFRAN) injection 4 mg (has no administration in time range)  metoprolol tartrate (LOPRESSOR) tablet 25 mg (25 mg Oral Given 04/30/23 1850)  hydrALAZINE (APRESOLINE) injection 5 mg (has no administration in time range)  ondansetron (ZOFRAN-ODT) disintegrating  tablet 4 mg (4 mg Oral Given 04/30/23 0943)  furosemide (LASIX) injection 20 mg (20 mg Intravenous Given 04/30/23 1547)  potassium chloride SA (KLOR-CON M) CR tablet 40 mEq (40 mEq Oral Given 04/30/23 1602)  ipratropium-albuterol (DUONEB) 0.5-2.5 (3) MG/3ML nebulizer solution 3 mL (3 mLs Nebulization Given 04/30/23 1602)  hydrALAZINE (APRESOLINE) injection 5 mg (5 mg Intravenous Given 04/30/23 1713)     IMPRESSION / MDM / ASSESSMENT AND PLAN / ED COURSE  I reviewed the triage vital signs and the nursing notes.                              Differential diagnosis includes, but is not limited to, hip pain from bursitis, tendonitis, abscess, unlikely septic arthritis; weakness/SOB from CHF, COPD, PNA, PE, ACS, anemia  Patient's presentation is most consistent with acute presentation with potential threat to life or bodily function.  The patient is on the cardiac monitor to evaluate for evidence of arrhythmia and/or significant heart rate changes  Clinical Course as of 04/30/23 1939  Tue Apr 30, 2023  1543 Presents for hip pain and shortness of breath.  Prior hip replacement - with pain and swelling to the hip but no warmth/redness.  Able to range but significant pain.  CHF exacerbation - given IV lasix 20 mg.  CT pending. Admitting to hospitalist.  [SM]    Clinical Course User Index [SM] Corena Herter, MD   89 year old female here with shortness of breath and hip pain.  Regarding her shortness of breath, the patient appears  hypervolemic on exam with pitting edema over bilateral lower extremities.  She has bilateral rails, increased work of breathing, and a BNP of 748, significantly above her baseline.  Troponin mildly elevated.  Suspect multifactorial hypoxic respiratory failure secondary to COPD and CHF.  Will give diuresis and a breathing treatment.  Will admit for this.  Regarding her hip pain, the exam is consistent with possible bursitis or lipoma.  She does have a history of a hip replacement here.  She is able to range it, making septic arthritis less likely.  Plain film showed no fracture.  Will obtain CT hip and assess further as inpatient.  FINAL CLINICAL IMPRESSION(S) / ED DIAGNOSES   Final diagnoses:  Fall, initial encounter  Left hip pain  Acute respiratory failure with hypoxia (HCC)     Rx / DC Orders   ED Discharge Orders     None        Note:  This document was prepared using Dragon voice recognition software and may include unintentional dictation errors.   Shaune Pollack, MD 04/30/23 [phone]

## 2023-04-30 NOTE — ED Notes (Signed)
Pharmacy messaged about Cardizem dose.

## 2023-04-30 NOTE — Consult Note (Signed)
ORTHOPAEDIC CONSULTATION  REQUESTING PHYSICIAN: Lucile Shutters, MD  Chief Complaint:   Left hip pain.  History of Present Illness: Leslie Parker is a 89 y.o. female with multiple medical problems including diabetes, atrial fibrillation, hypertension, osteopenia, and asthma who is known to me as I performed an intramedullary nailing of her right hip fracture in July, 2024.  The patient has been recovering from this procedure slowly as she still notes that there is some weakness in her right leg and she has difficulty performing a straight leg raise on her right side.  However, she began to notice increased lateral sided left hip pain 3 to 4 weeks ago, and then noticed a fullness on the lateral aspect of her hip which concerned her.  She also apparently developed a blister which popped last week.  She does not recall any injury to the left hip, and denies any fevers or chills.    The patient has a substantial history of problems with her left hip.  Apparently, she first sustained a displaced intertrochanteric fracture of her left hip in 2016 which was fixed with an intramedullary nail.  However, she apparently started complaining of lateral sided left hip pain, so the lag screw was removed.  Shortly thereafter, she developed a subcapital fracture of the left hip requiring removal of the nail and placement of a hip hemiarthroplasty in 2018.  She continues to complain of lateral sided left hip pain and was referred to Dr. Odis Luster by his partner, Dr. Hyacinth Meeker.  Dr. Odis Luster felt that she was having pain due to degenerative changes of the acetabulum and proceeded with a conversion of her left hip hemiarthroplasty to a total hip arthroplasty in May, 2019.  The patient states that she had been doing reasonably well from this surgery, although she was still having some lateral sided left hip pain.  However, she does not recall having the bump that she  noticed several weeks ago.  This has caused her great concern and appears to be the primary precipitating reason she presented to the hospital today.  Evaluation in the emergency room is also demonstrated an elevated blood pressure, low potassium, and evidence of worsening congestive heart failure, prompting her to be admitted to the hospitalist service for medical stabilization.  The hospitalist has consulted orthopedics for further evaluation of her left hip pain.  Past Medical History:  Diagnosis Date   Asthma    Atrial fibrillation with RVR (HCC)    Cancer (HCC)    Basal Cell   Diabetes mellitus without complication (HCC)    Heart murmur    Hypertension    Impetigo    Osteoarthritis    Osteopenia    Vomiting    can not due to surgery   Wears dentures    full upper and lower   Past Surgical History:  Procedure Laterality Date   ABDOMINAL HYSTERECTOMY     BACK SURGERY     BLADDER SURGERY     mesh   CARPAL TUNNEL RELEASE Bilateral    CATARACT EXTRACTION Right 2017   CATARACT EXTRACTION W/PHACO Left 02/06/2016   Procedure: CATARACT EXTRACTION PHACO AND INTRAOCULAR LENS PLACEMENT (IOC) left eye;  Surgeon: Sherald Hess, MD;  Location: [facility];  Service: Ophthalmology;  Laterality: Left;  DIABETIC LEFT Cannot arrive before 9:30   CERVICAL DISC SURGERY     CHOLECYSTECTOMY     EYE SURGERY Bilateral    Cataract Extraction with IOL   FEMUR IM NAIL Left 02/04/2015  Procedure: INTRAMEDULLARY (IM) NAIL FEMORAL;  Surgeon: Juanell Fairly, MD;  Location: [facility];  Service: Orthopedics;  Laterality: Left;   HARDWARE REMOVAL Left 01/17/2017   Procedure: HARDWARE REMOVAL;  Surgeon: Juanell Fairly, MD;  Location: [facility];  Service: Orthopedics;  Laterality: Left;   HERNIA REPAIR  2014   esophageal and gastric mesh. patient unable to throw up d/t mesh   HIP ARTHROPLASTY Left 01/26/2017   Procedure: ARTHROPLASTY BIPOLAR HIP (HEMIARTHROPLASTY) removal hardware left hip;   Surgeon: Deeann Saint, MD;  Location: [facility];  Service: Orthopedics;  Laterality: Left;   INTRAMEDULLARY (IM) NAIL INTERTROCHANTERIC Right 02/13/2023   Procedure: INTRAMEDULLARY (IM) NAIL INTERTROCHANTERIC;  Surgeon: Christena Flake, MD;  Location: [facility];  Service: Orthopedics;  Laterality: Right;   JOINT REPLACEMENT Bilateral    knees   PACEMAKER IMPLANT N/A 11/15/2022   Procedure: PACEMAKER IMPLANT;  Surgeon: Marcina Millard, MD;  Location: [facility];  Service: Cardiovascular;  Laterality: N/A;   PACEMAKER IMPLANT N/A 11/28/2022   Procedure: PACEMAKER IMPLANT;  Surgeon: Marcina Millard, MD;  Location: [facility];  Service: Cardiovascular;  Laterality: N/A;  Lead reposition   REPLACEMENT TOTAL KNEE BILATERAL Bilateral [id number]   SHOULDER ARTHROSCOPY WITH OPEN ROTATOR CUFF REPAIR AND DISTAL CLAVICLE ACROMINECTOMY Left 10/25/2016   Procedure: SHOULDER ARTHROSCOPY WITH OPEN ROTATOR CUFF REPAIR AND DISTAL CLAVICLE ACROMINECTOMY;  Surgeon: Juanell Fairly, MD;  Location: [facility];  Service: Orthopedics;  Laterality: Left;   THYROID SURGERY     goiter removed   TOTAL HIP REVISION Left 12/02/2017   Procedure: TOTAL HIP REVISION;  Surgeon: Lyndle Herrlich, MD;  Location: [facility];  Service: Orthopedics;  Laterality: Left;   TOTAL SHOULDER REPLACEMENT Right 2012   Social History   Socioeconomic History   Marital status: Divorced    Spouse name: Not on file   Number of children: Not on file   Years of education: Not on file   Highest education level: Not on file  Occupational History   Not on file  Tobacco Use   Smoking status: Never   Smokeless tobacco: Never  Vaping Use   Vaping status: Never Used  Substance and Sexual Activity   Alcohol use: No    Alcohol/week: 0.0 standard drinks of alcohol   Drug use: No   Sexual activity: Never  Other Topics Concern   Not on file  Social History Narrative   Not on file   Social Determinants of Health    Financial Resource Strain: Not on file  Food Insecurity: No Food Insecurity (03/21/2023)   Hunger Vital Sign    Worried About Running Out of Food in the Last Year: Never true    Ran Out of Food in the Last Year: Never true  Transportation Needs: No Transportation Needs (03/21/2023)   PRAPARE - Administrator, Civil Service (Medical): No    Lack of Transportation (Non-Medical): No  Physical Activity: Not on file  Stress: Not on file  Social Connections: Not on file   Family History  Problem Relation Age of Onset   Heart failure Mother    Hypertension Mother    Emphysema Father    Allergies  Allergen Reactions   Baclofen Other (See Comments)    Elevated Cr   Simvastatin Other (See Comments)    Pt denies  Elevated LFTs with simva 40mg , stopped and resolved (see MD note 11/10/13)   Prior to Admission medications   Medication Sig Start Date End Date Taking? Authorizing  Provider  albuterol (VENTOLIN HFA) 108 (90 Base) MCG/ACT inhaler Inhale 2 puffs into the lungs every 6 (six) hours as needed. 08/04/19   [provider]  amiodarone (PACERONE) 200 MG tablet Take 200 mg by mouth daily.    [provider]  apixaban (ELIQUIS) 2.5 MG TABS tablet Take 1 tablet (2.5 mg total) by mouth 2 (two) times daily. 06/07/22   Tresa Moore, MD  ascorbic acid (VITAMIN C) 250 MG CHEW Chew 250 mg by mouth daily.    [provider]  bisacodyl (DULCOLAX) 10 MG suppository Place 1 suppository (10 mg total) rectally daily as needed for moderate constipation. 04/12/23   Tommi Rumps, PA-C  Cholecalciferol (VITAMIN D-1000 MAX ST) 25 MCG (1000 UT) tablet Take 1,000 Units by mouth daily.    [provider]  colestipol (COLESTID) 1 g tablet Take 1 tablet by mouth 2 (two) times daily. 06/01/22   [provider]  Cyanocobalamin (B-12) 2500 MCG TABS Take 2,500 mcg by mouth daily.    [provider]  diltiazem (CARDIZEM CD) 360 MG 24 hr  capsule Take 360 mg by mouth daily. 11/23/22   [provider]  docusate sodium (COLACE) 100 MG capsule Take 1 capsule (100 mg total) by mouth 2 (two) times daily. 02/15/23   Sunnie Nielsen, DO  feeding supplement (ENSURE ENLIVE / ENSURE PLUS) LIQD Take 237 mLs by mouth 2 (two) times daily between meals. 02/15/23   Sunnie Nielsen, DO  furosemide (LASIX) 20 MG tablet Take 20 mg by mouth daily. 03/06/22   [provider]  glipiZIDE (GLUCOTROL XL) 2.5 MG 24 hr tablet Take 2.5 mg by mouth daily with breakfast. 03/27/22   [provider]  ipratropium-albuterol (DUONEB) 0.5-2.5 (3) MG/3ML SOLN Inhale 3 mLs into the lungs every 6 (six) hours as needed. 06/01/22   [provider]  Magnesium Oxide 500 MG CAPS Take 500 mg by mouth daily.    [provider]  methocarbamol (ROBAXIN) 500 MG tablet Take 1 tablet (500 mg total) by mouth every 8 (eight) hours as needed for muscle spasms. 02/15/23   Sunnie Nielsen, DO  metoprolol tartrate (LOPRESSOR) 25 MG tablet Take 1 tablet (25 mg total) by mouth 2 (two) times daily. 02/15/23   Sunnie Nielsen, DO  Multiple Vitamin (MULTIVITAMIN WITH MINERALS) TABS tablet Take 1 tablet by mouth daily. One-A-Day Women's Vitamin    [provider]  omeprazole (PRILOSEC) 20 MG capsule Take 20 mg by mouth every morning. 01/10/23   [provider]  oxyCODONE-acetaminophen (PERCOCET/ROXICET) 5-325 MG tablet Take 1 tablet by mouth every 4 (four) hours as needed for moderate pain. 02/15/23   Anson Oregon, PA-C  polyethylene glycol powder [facility]) 17 GM/SCOOP powder 1 capful twice a day in 8 oz of juice or water until you have a bowel movement and then once a day every day 04/12/23   Tommi Rumps, PA-C  traZODone (DESYREL) 100 MG tablet Take 100 mg by mouth at bedtime.    [provider]  TRELEGY ELLIPTA 100-62.5-25 MCG/ACT AEPB Inhale 1 puff into the lungs daily. 08/03/22   [provider]   CT  HIP LEFT WO CONTRAST  Result Date: 04/30/2023 CLINICAL DATA:  Left hip pain with cyst for 1 week. Replacement 2 years ago. Recent green drainage, infection suspected. EXAM: CT OF THE LEFT HIP WITHOUT CONTRAST TECHNIQUE: Multidetector CT imaging of the left hip was performed according to the standard protocol. Multiplanar CT image reconstructions were also generated.  RADIATION DOSE REDUCTION: This exam was performed according to the departmental dose-optimization program which includes automated exposure control, adjustment of the mA and/or kV according to patient size and/or use of iterative reconstruction technique. COMPARISON:  Radiographs 04/30/2023, 02/13/2023 and 01/26/2017. FINDINGS: Despite efforts by the technologist and patient, mild motion artifact is present on today's exam and could not be eliminated. This reduces exam sensitivity and specificity. Bones/Joint/Cartilage Status post left total hip arthroplasty. There is a screw fixed acetabular component which appears intact without loosening. Evaluation of the proximal femur is limited by motion and beam hardening artifact from the femoral prosthesis. There is an intact proximal cerclage wire. No evidence of acute fracture or dislocation. Chronic nonunion of the greater trochanter. No definite bone destruction is seen, although is difficult to exclude endosteal thinning along the posterolateral aspect of the femoral prosthesis given the motion. No evidence of significant joint effusion. Ligaments Suboptimally assessed by CT. Muscles and Tendons Generalized muscular atrophy. No focal intramuscular fluid collection identified. Soft tissues There is stranding in the subcutaneous fat posterior and lateral to the left hip. No focal fluid collection, unexpected foreign body or soft tissue emphysema identified. Iliofemoral atherosclerosis noted. The visualized internal pelvic contents are notable for prominent sigmoid diverticulosis without evidence of acute  inflammation. IMPRESSION: 1. Limited evaluation of the proximal left femur due to motion and beam hardening artifact from the femoral prosthesis. No definite bone destruction is seen, although endosteal thinning along the posterolateral aspect of the femoral prosthesis is difficult to exclude. Follow-up may be warranted. 2. No evidence of acute fracture or dislocation. 3. Nonspecific subcutaneous stranding posterior and lateral to the left hip without focal fluid collection, unexpected foreign body or soft tissue emphysema. 4. Generalized muscular atrophy. Electronically Signed   By: Carey Bullocks M.D.   On: 04/30/2023 17:26   DG HIP UNILAT WITH PELVIS 2-3 VIEWS LEFT  Result Date: 04/30/2023 CLINICAL DATA:  Left hip pain. EXAM: DG HIP (WITH OR WITHOUT PELVIS) 2-3V LEFT COMPARISON:  01/26/2017. FINDINGS: Evaluation is limited due to artifact from overlying bowel gas. No acute displaced fracture or dislocation. No aggressive osseous lesion. There are changes of chronic pubic symphisitis. Left hip arthroplasty noted. There is open reduction and internal fixation of intertrochanteric right proximal femur fracture with trochanteric fixation nail and helical blade. No radiopaque foreign bodies. IMPRESSION: 1. No acute displaced fracture or dislocation. 2. Left hip arthroplasty. Electronically Signed   By: Jules Schick M.D.   On: 04/30/2023 15:21   DG Chest 2 View  Result Date: 04/30/2023 CLINICAL DATA:  Shortness of breath. EXAM: CHEST - 2 VIEW COMPARISON:  04/12/2023. FINDINGS: There are probable atelectatic changes at the left lung base. Bilateral lung fields are otherwise clear. No acute consolidation or lung collapse. Bilateral costophrenic angles are clear. Normal cardio-mediastinal silhouette. Aortic arch calcifications noted. No acute osseous abnormalities. Partially seen right reverse total shoulder arthroplasty.  T There is a left-sided dual lead cardiac pacemaker. The soft tissues are within normal  limits. IMPRESSION: No active cardiopulmonary disease. Electronically Signed   By: Jules Schick M.D.   On: 04/30/2023 15:17    Positive ROS: All other systems have been reviewed and were otherwise negative with the exception of those mentioned in the HPI and as above.  Physical Exam: General:  Alert, no acute distress Psychiatric:  Patient is competent for consent with normal mood and affect   Cardiovascular:  No pedal edema Respiratory:  No wheezing, non-labored breathing GI:  Abdomen is soft and  non-tender Skin:  No lesions in the area of chief complaint Neurologic:  Sensation intact distally Lymphatic:  No axillary or cervical lymphadenopathy  Orthopedic Exam:  Orthopedic examination is limited to the left hip and lower extremity.  Skin inspection of the left hip, thigh, and knee are notable for well-healed surgical incisions which show no evidence for infection.  There is a healing area of blistering over the lateral aspect of the hip near the inferior end of the incision which measures approximate 1.5 cm in diameter.  However, there is no surrounding erythema and no moisture or drainage from it to suggest infection.  In addition, there is no swelling, erythema, ecchymosis, abrasions, or other skin abnormalities identified around the hip area.  There is a subcutaneous soft tissue mass of uncertain etiology measuring approximately 2.5 x 5 cm in this area that is mildly tender to palpation.  Given the multiple surgeries she has had in this area, it is possible that it could be scar tissue, although the patient does feel that it developed over the past few weeks to a month in that she had not recognized it prior to this time period.  She is able to actively flex her hip and to perform a straight leg raise without pain.  She has no pain with logrolling of the hip.  She is grossly neurovascularly intact to the left lower extremity and foot.  X-rays:  X-rays of the pelvis and left hip are available  for review and have been reviewed by myself.  These films demonstrate what appears to be a well-positioned and well-fixed left total hip arthroplasty without evidence of loosening.  There is evidence of an old displaced nonunited greater trochanteric fracture which was present on prior films from May, 2019, and appears to be unchanged in position.  No other acute bony abnormalities are identified.  A CT scan of the left hip and pelvis also has been obtained and reviewed by myself.  Although the official report is not available yet, by my review there does not appear to be any evidence for acute fractures or loosening of the total hip components.  Assessment: Lateral sided left hip pain of unclear etiology status post multiple prior left hip surgeries culminating in a left total hip arthroplasty.  Plan: The treatment options have been discussed with the patient.  Based on the patient's x-rays and CT scan, I do not feel that there are any fractures or evidence of component loosening to explain the patient's present symptoms.  Furthermore, the patient does not have any fevers or elevated white count to suggest occult infection.  The area of fullness over the lateral side of her hip appears to be what is most concerning to the patient at this time.  It is possible that these symptoms may be a result of irritated scar tissue.    Regardless, pending any unforeseen results emanating from the official CT scan report, the patient may be mobilized with physical therapy, weightbearing as tolerated to the left lower extremity.  Further workup of the soft tissue mass may include a diagnostic ultrasound of the mass versus an MRI scan of the left hip.  Thank you for asking me to participate in the care of this most pleasant and unfortunate woman.  I will be happy to follow her with you.   Maryagnes Amos, MD  Beeper #:  [phone]  04/30/2023 5:34 PM

## 2023-04-30 NOTE — Assessment & Plan Note (Signed)
Replaced. °

## 2023-04-30 NOTE — Assessment & Plan Note (Addendum)
Patient was noted to be tachypneic and had room air pulse oximetry of 88%.  Patient currently off oxygen

## 2023-04-30 NOTE — Assessment & Plan Note (Signed)
Continue amiodarone, metoprolol and Cardizem for rate control Continue Eliquis as primary prophylaxis for an acute stroke

## 2023-04-30 NOTE — Assessment & Plan Note (Signed)
Unclear etiology and may be secondary to demand ischemia from acute diastolic dysfunction CHF Obtain serial cardiac enzymes Continue metoprolol Consult cardiology

## 2023-04-30 NOTE — ED Triage Notes (Signed)
Pt here via [facility] with left hip pain. Pt denies fall or injury but states she has swelling and a knot on that hip that started a few days ago. Pt states she had a hip repair on the left 2 years ago and has been fine but until this moment. Pt also states it is draining some fluid. Pt has a small skin tear in that area on observation.

## 2023-04-30 NOTE — H&P (Addendum)
History and Physical    Patient: Leslie Parker:[id number] DOB: May 13, 1933 DOA: 04/30/2023 DOS: the patient was seen and examined on 04/30/2023 PCP: [name], MD  Patient coming from: Home  Chief Complaint:  Chief Complaint  Patient presents with   Hip Pain   HPI: Leslie Parker is a 89 y.o. female with medical history significant for chronic diastolic dysfunction CHF with last known LVEF of 60 to 65%, 2D echocardiogram which was done 07/24, status post permanent pacemaker placement for sick sinus syndrome, history of A-fib on chronic anticoagulation with Eliquis, diabetes mellitus with complications of stage IIIb chronic kidney disease, COPD, status post recent left hip arthroplasty following a hip fracture who presents to the emergency room with complaints of worsening left hip pain for over a week.  She rates her pain a 7 x 10 in intensity at its worst and has difficulty with ambulation due to pain.  Was noted to have elevated blood pressure upon arrival to the ER but had not taking her blood pressure medications.  She denies any recent fall or trauma to her left hip. She also has bilateral lower extremity swelling, and nonproductive cough and diffuse wheezing but denies having any chest pain, no worsening shortness of breath from her baseline, no headache, no fever, no chills, no abdominal pain, no changes in her bowel habits, no dizziness, no lightheadedness, no blurred vision of deficit. Abnormal labs include a troponin of 51, BNP 748, potassium 3.1, BUN 27, creatinine 1.50 Twelve-lead EKG reviewed by me shows sinus rhythm, prolonged PR interval   Review of Systems: As mentioned in the history of present illness. All other systems reviewed and are negative. Past Medical History:  Diagnosis Date   Asthma    Atrial fibrillation with RVR (HCC)    Cancer (HCC)    Basal Cell   Diabetes mellitus without complication (HCC)    Heart murmur    Hypertension    Impetigo     Osteoarthritis    Osteopenia    Vomiting    can not due to surgery   Wears dentures    full upper and lower   Past Surgical History:  Procedure Laterality Date   ABDOMINAL HYSTERECTOMY     BACK SURGERY     BLADDER SURGERY     mesh   CARPAL TUNNEL RELEASE Bilateral    CATARACT EXTRACTION Right 2017   CATARACT EXTRACTION W/PHACO Left 02/06/2016   Procedure: CATARACT EXTRACTION PHACO AND INTRAOCULAR LENS PLACEMENT (IOC) left eye;  Surgeon: Sherald Hess, MD;  Location: [facility];  Service: Ophthalmology;  Laterality: Left;  DIABETIC LEFT Cannot arrive before 9:30   CERVICAL DISC SURGERY     CHOLECYSTECTOMY     EYE SURGERY Bilateral    Cataract Extraction with IOL   FEMUR IM NAIL Left 02/04/2015   Procedure: INTRAMEDULLARY (IM) NAIL FEMORAL;  Surgeon: Juanell Fairly, MD;  Location: [facility];  Service: Orthopedics;  Laterality: Left;   HARDWARE REMOVAL Left 01/17/2017   Procedure: HARDWARE REMOVAL;  Surgeon: Juanell Fairly, MD;  Location: [facility];  Service: Orthopedics;  Laterality: Left;   HERNIA REPAIR  2014   esophageal and gastric mesh. patient unable to throw up d/t mesh   HIP ARTHROPLASTY Left 01/26/2017   Procedure: ARTHROPLASTY BIPOLAR HIP (HEMIARTHROPLASTY) removal hardware left hip;  Surgeon: Deeann Saint, MD;  Location: [facility];  Service: Orthopedics;  Laterality: Left;   INTRAMEDULLARY (IM) NAIL INTERTROCHANTERIC Right 02/13/2023   Procedure: INTRAMEDULLARY (IM) NAIL INTERTROCHANTERIC;  Surgeon: Christena Flake, MD;  Location: [facility];  Service: Orthopedics;  Laterality: Right;   JOINT REPLACEMENT Bilateral    knees   PACEMAKER IMPLANT N/A 11/15/2022   Procedure: PACEMAKER IMPLANT;  Surgeon: Marcina Millard, MD;  Location: [facility];  Service: Cardiovascular;  Laterality: N/A;   PACEMAKER IMPLANT N/A 11/28/2022   Procedure: PACEMAKER IMPLANT;  Surgeon: Marcina Millard, MD;  Location: [facility];  Service: Cardiovascular;   Laterality: N/A;  Lead reposition   REPLACEMENT TOTAL KNEE BILATERAL Bilateral [id number]   SHOULDER ARTHROSCOPY WITH OPEN ROTATOR CUFF REPAIR AND DISTAL CLAVICLE ACROMINECTOMY Left 10/25/2016   Procedure: SHOULDER ARTHROSCOPY WITH OPEN ROTATOR CUFF REPAIR AND DISTAL CLAVICLE ACROMINECTOMY;  Surgeon: Juanell Fairly, MD;  Location: [facility];  Service: Orthopedics;  Laterality: Left;   THYROID SURGERY     goiter removed   TOTAL HIP REVISION Left 12/02/2017   Procedure: TOTAL HIP REVISION;  Surgeon: Lyndle Herrlich, MD;  Location: [facility];  Service: Orthopedics;  Laterality: Left;   TOTAL SHOULDER REPLACEMENT Right 2012   Social History:  reports that she has never smoked. She has never used smokeless tobacco. She reports that she does not drink alcohol and does not use drugs.  Allergies  Allergen Reactions   Baclofen Other (See Comments)    Elevated Cr   Simvastatin Other (See Comments)    Pt denies  Elevated LFTs with simva 40mg , stopped and resolved (see MD note 11/10/13)    Family History  Problem Relation Age of Onset   Heart failure Mother    Hypertension Mother    Emphysema Father     Prior to Admission medications   Medication Sig Start Date End Date Taking? Authorizing Provider  albuterol (VENTOLIN HFA) 108 (90 Base) MCG/ACT inhaler Inhale 2 puffs into the lungs every 6 (six) hours as needed. 08/04/19   [provider]  amiodarone (PACERONE) 200 MG tablet Take 200 mg by mouth daily.    [provider]  apixaban (ELIQUIS) 2.5 MG TABS tablet Take 1 tablet (2.5 mg total) by mouth 2 (two) times daily. 06/07/22   Tresa Moore, MD  ascorbic acid (VITAMIN C) 250 MG CHEW Chew 250 mg by mouth daily.    [provider]  bisacodyl (DULCOLAX) 10 MG suppository Place 1 suppository (10 mg total) rectally daily as needed for moderate constipation. 04/12/23   Tommi Rumps, PA-C  Cholecalciferol (VITAMIN D-1000 MAX ST) 25 MCG (1000 UT) tablet Take 1,000  Units by mouth daily.    [provider]  colestipol (COLESTID) 1 g tablet Take 1 tablet by mouth 2 (two) times daily. 06/01/22   [provider]  Cyanocobalamin (B-12) 2500 MCG TABS Take 2,500 mcg by mouth daily.    [provider]  diltiazem (CARDIZEM CD) 360 MG 24 hr capsule Take 360 mg by mouth daily. 11/23/22   [provider]  docusate sodium (COLACE) 100 MG capsule Take 1 capsule (100 mg total) by mouth 2 (two) times daily. 02/15/23   Sunnie Nielsen, DO  feeding supplement (ENSURE ENLIVE / ENSURE PLUS) LIQD Take 237 mLs by mouth 2 (two) times daily between meals. 02/15/23   Sunnie Nielsen, DO  furosemide (LASIX) 20 MG tablet Take 20 mg by mouth daily. 03/06/22   [provider]  glipiZIDE (GLUCOTROL XL) 2.5 MG 24 hr tablet Take 2.5 mg by mouth daily with breakfast. 03/27/22   [provider]  ipratropium-albuterol (DUONEB) 0.5-2.5 (3) MG/3ML SOLN Inhale 3 mLs  into the lungs every 6 (six) hours as needed. 06/01/22   [provider]  Magnesium Oxide 500 MG CAPS Take 500 mg by mouth daily.    [provider]  methocarbamol (ROBAXIN) 500 MG tablet Take 1 tablet (500 mg total) by mouth every 8 (eight) hours as needed for muscle spasms. 02/15/23   Sunnie Nielsen, DO  metoprolol tartrate (LOPRESSOR) 25 MG tablet Take 1 tablet (25 mg total) by mouth 2 (two) times daily. 02/15/23   Sunnie Nielsen, DO  Multiple Vitamin (MULTIVITAMIN WITH MINERALS) TABS tablet Take 1 tablet by mouth daily. One-A-Day Women's Vitamin    [provider]  omeprazole (PRILOSEC) 20 MG capsule Take 20 mg by mouth every morning. 01/10/23   [provider]  oxyCODONE-acetaminophen (PERCOCET/ROXICET) 5-325 MG tablet Take 1 tablet by mouth every 4 (four) hours as needed for moderate pain. 02/15/23   Anson Oregon, PA-C  polyethylene glycol powder [facility]) 17 GM/SCOOP powder 1 capful twice a day in 8 oz of juice or water until you  have a bowel movement and then once a day every day 04/12/23   Tommi Rumps, PA-C  traZODone (DESYREL) 100 MG tablet Take 100 mg by mouth at bedtime.    [provider]  TRELEGY ELLIPTA 100-62.5-25 MCG/ACT AEPB Inhale 1 puff into the lungs daily. 08/03/22   [provider]    Physical Exam: Vitals:   04/30/23 1725 04/30/23 1800 04/30/23 1830 04/30/23 1900  BP:  (!) 167/82 (!) 174/101 (!) 180/79  Pulse: 96 74 84 82  Resp: 15 (!) 21 15 14   Temp:      TempSrc:      SpO2: 95% 97% 99% 97%  Weight:      Height:       Physical Exam Vitals and nursing note reviewed.  Constitutional:      Appearance: Normal appearance.     Comments: Appears comfortable and in no distress  HENT:     Nose: Nose normal.     Mouth/Throat:     Mouth: Mucous membranes are moist.  Eyes:     Conjunctiva/sclera: Conjunctivae normal.  Cardiovascular:     Rate and Rhythm: Normal rate and regular rhythm.  Pulmonary:     Effort: Pulmonary effort is normal.     Breath sounds: No wheezing or rales.  Abdominal:     General: Bowel sounds are normal.     Palpations: Abdomen is soft.     Comments: Central adiposity  Musculoskeletal:     Cervical back: Normal range of motion and neck supple.     Right lower leg: Edema present.     Left lower leg: Edema present.  Skin:    General: Skin is warm and dry.  Neurological:     Mental Status: She is alert and oriented to person, place, and time.     Motor: Weakness present.  Psychiatric:        Mood and Affect: Mood normal.        Behavior: Behavior normal.     Data Reviewed: Relevant notes from primary care and specialist visits, past discharge summaries as available in EHR, including Care Everywhere. Prior diagnostic testing as pertinent to current admission diagnoses Updated medications and problem lists for reconciliation ED course, including vitals, labs, imaging, treatment and response to treatment Triage notes, nursing and pharmacy  notes and ED provider's notes Notable results as noted in HPI Labs reviewed.  Troponin 51, BNP 748, sodium 137, potassium 3.1, chloride 100,  bicarb 24, glucose 114, BUN 27, creatinine 1.50, calcium 8.7, total protein 7.1, albumin 3.7, AST 38, ALT 29, alkaline phosphatase 143, total bilirubin 1.1, lipase 20, white count 5.9, hemoglobin 13.6, hematocrit 39.6, platelet count 247 Chest x-ray reviewed by me shows no acute cardiopulmonary disease Left hip x-ray reviewed by me shows no acute displaced fracture or dislocation. Left hip arthroplasty.  There are no new results to review at this time.  Assessment and Plan: * Acute hypoxic respiratory failure (HCC) Most likely secondary to acute on chronic diastolic dysfunction CHF rule out possible aspiration pneumonia Patient was noted to be tachypneic and had room air pulse oximetry of 88% She is currently on 2 L of oxygen to maintain pulse oximetry greater than 92% Follow-up results of CT scan of the chest without contrast Patient will need to be assessed for home oxygen need prior to discharge   Acute on chronic diastolic CHF (congestive heart failure) (HCC) Most likely secondary to hypertensive heart disease Patient noted to have significantly elevated blood pressure upon arrival to the ER She has bilateral lower extremity swelling, shortness of breath, cough, wheezing and elevated BNP levels of 748 We will place patient on Lasix 40 mg IV daily Optimize blood pressure control Consult cardiology  Paroxysmal atrial fibrillation (HCC) Continue amiodarone, metoprolol and Cardizem for rate control Continue Eliquis as primary prophylaxis for an acute stroke  Elevated troponin Unclear etiology and may be secondary to demand ischemia from acute diastolic dysfunction CHF Obtain serial cardiac enzymes Continue metoprolol Consult cardiology  Type II diabetes mellitus with renal manifestations (HCC) Patient with diabetes mellitus with complications  of stage IIIb chronic kidney disease Hold glipizide Monitor renal function closely while on diuretic therapy Maintain consistent carbohydrate diet Last hemoglobin A1c is 5.8 Check blood sugars with meals  COPD (chronic obstructive pulmonary disease) (HCC) Stable and not acutely exacerbated Continue bronchodilator therapy and inhaled steroids  Hypokalemia Secondary to diuretic therapy Supplement potassium Check magnesium levels      Advance Care Planning:   Code Status: Full Code   Consults: Cardiology, orthopedic surgery  Family Communication: Plan of care was discussed with patient at the bedside.  All questions and concerns have been addressed.  She verbalizes understanding and agrees to the plan.  CODE STATUS was discussed and she requests to be full code.  Severity of Illness: The appropriate patient status for this patient is INPATIENT. Inpatient status is judged to be reasonable and necessary in order to provide the required intensity of service to ensure the patient's safety. The patient's presenting symptoms, physical exam findings, and initial radiographic and laboratory data in the context of their chronic comorbidities is felt to place them at high risk for further clinical deterioration. Furthermore, it is not anticipated that the patient will be medically stable for discharge from the hospital within 2 midnights of admission.   * I certify that at the point of admission it is my clinical judgment that the patient will require inpatient hospital care spanning beyond 2 midnights from the point of admission due to high intensity of service, high risk for further deterioration and high frequency of surveillance required.*  Author: Lucile Shutters, MD 04/30/2023 7:40 PM  For on call review [email or web address].

## 2023-04-30 NOTE — ED Triage Notes (Signed)
Arrives from home via [facility].  C/O left hip cyst and pain x 1 week. Hx of hip replacmenet 2 years ago.  Friday took bandage off and noticed some green drainage.  VS wnl, BP elevated, bu t has not taken BP meds yet.  T 98.6

## 2023-04-30 NOTE — Consult Note (Addendum)
Pharmacy Antibiotic Note  Leslie Parker is a 89 y.o. female admitted on 04/30/2023 with  aspiration pneumonia .  Pharmacy has been consulted for Unasyn dosing.  Plan: Unasyn 3gm IV q 8 hrs  Height: 5\' 9"  (175.3 cm) Weight: 85.2 kg (187 lb 13.3 oz) IBW/kg (Calculated) : 66.2  Temp (24hrs), Avg:98.3 F (36.8 C), Min:98.1 F (36.7 C), Max:98.4 F (36.9 C)  Recent Labs  Lab 04/30/23 1050  WBC 5.9  CREATININE 1.50*    Estimated Creatinine Clearance: 30.2 mL/min (A) (by C-G formula based on SCr of 1.5 mg/dL (H)).    Allergies  Allergen Reactions   Baclofen Other (See Comments)    Elevated Cr   Simvastatin Other (See Comments)    Pt denies  Elevated LFTs with simva 40mg , stopped and resolved (see MD note 11/10/13)    Antimicrobials this admission: 10/8 Unasyn >>  Dose adjustments this admission: n/a  Microbiology results:   Thank you for allowing pharmacy to be a part of this patient's care.  Jacqueline Delapena Rodriguez-Guzman PharmD, BCPS 04/30/2023 7:47 PM

## 2023-04-30 NOTE — Assessment & Plan Note (Signed)
Patient on IV Lasix.  Replace potassium.  Last echocardiogram shows an EF of 60%

## 2023-04-30 NOTE — ED Notes (Signed)
Lab called to add on troponin 

## 2023-04-30 NOTE — Assessment & Plan Note (Deleted)
Stable and not acutely exacerbated Continue bronchodilator therapy and inhaled steroids

## 2023-05-01 ENCOUNTER — Encounter: Payer: Self-pay | Admitting: Internal Medicine

## 2023-05-01 DIAGNOSIS — M25552 Pain in left hip: Secondary | ICD-10-CM

## 2023-05-01 DIAGNOSIS — N1832 Chronic kidney disease, stage 3b: Secondary | ICD-10-CM

## 2023-05-01 DIAGNOSIS — J441 Chronic obstructive pulmonary disease with (acute) exacerbation: Secondary | ICD-10-CM

## 2023-05-01 DIAGNOSIS — J9601 Acute respiratory failure with hypoxia: Secondary | ICD-10-CM | POA: Diagnosis not present

## 2023-05-01 DIAGNOSIS — I5033 Acute on chronic diastolic (congestive) heart failure: Secondary | ICD-10-CM

## 2023-05-01 DIAGNOSIS — I5A Non-ischemic myocardial injury (non-traumatic): Secondary | ICD-10-CM | POA: Insufficient documentation

## 2023-05-01 DIAGNOSIS — I480 Paroxysmal atrial fibrillation: Secondary | ICD-10-CM | POA: Diagnosis not present

## 2023-05-01 DIAGNOSIS — E1122 Type 2 diabetes mellitus with diabetic chronic kidney disease: Secondary | ICD-10-CM

## 2023-05-01 DIAGNOSIS — E663 Overweight: Secondary | ICD-10-CM | POA: Insufficient documentation

## 2023-05-01 DIAGNOSIS — E876 Hypokalemia: Secondary | ICD-10-CM

## 2023-05-01 LAB — GLUCOSE, CAPILLARY
Glucose-Capillary: 105 mg/dL — ABNORMAL HIGH (ref 70–99)
Glucose-Capillary: 145 mg/dL — ABNORMAL HIGH (ref 70–99)
Glucose-Capillary: 263 mg/dL — ABNORMAL HIGH (ref 70–99)

## 2023-05-01 LAB — BASIC METABOLIC PANEL
Anion gap: 11 (ref 5–15)
BUN: 26 mg/dL — ABNORMAL HIGH (ref 8–23)
CO2: 24 mmol/L (ref 22–32)
Calcium: 7.9 mg/dL — ABNORMAL LOW (ref 8.9–10.3)
Chloride: 101 mmol/L (ref 98–111)
Creatinine, Ser: 1.57 mg/dL — ABNORMAL HIGH (ref 0.44–1.00)
GFR, Estimated: 32 mL/min — ABNORMAL LOW (ref >60)
Glucose, Bld: 111 mg/dL — ABNORMAL HIGH (ref 70–99)
Potassium: 3.1 mmol/L — ABNORMAL LOW (ref 3.5–5.1)
Sodium: 136 mmol/L (ref 135–145)

## 2023-05-01 LAB — CBC
HCT: 33 % — ABNORMAL LOW (ref 36.0–46.0)
Hemoglobin: 10.9 g/dL — ABNORMAL LOW (ref 12.0–15.0)
MCH: 30.3 pg (ref 26.0–34.0)
MCHC: 33 g/dL (ref 30.0–36.0)
MCV: 91.7 fL (ref 80.0–100.0)
Platelets: 190 10*3/uL (ref 150–400)
RBC: 3.6 MIL/uL — ABNORMAL LOW (ref 3.87–5.11)
RDW: 13.2 % (ref 11.5–15.5)
WBC: 5.6 10*3/uL (ref 4.0–10.5)
nRBC: 0 % (ref 0.0–0.2)

## 2023-05-01 MED ORDER — ALBUTEROL SULFATE (2.5 MG/3ML) 0.083% IN NEBU
2.5000 mg | INHALATION_SOLUTION | Freq: Four times a day (QID) | RESPIRATORY_TRACT | Status: DC
  Administered 2023-05-01: 2.5 mg via RESPIRATORY_TRACT
  Filled 2023-05-01: qty 3

## 2023-05-01 MED ORDER — INFLUENZA VAC A&B SURF ANT ADJ 0.5 ML IM SUSY
0.5000 mL | PREFILLED_SYRINGE | INTRAMUSCULAR | Status: AC
  Administered 2023-05-02: 0.5 mL via INTRAMUSCULAR
  Filled 2023-05-01: qty 0.5

## 2023-05-01 MED ORDER — UMECLIDINIUM BROMIDE 62.5 MCG/ACT IN AEPB
1.0000 | INHALATION_SPRAY | Freq: Every day | RESPIRATORY_TRACT | Status: DC
  Administered 2023-05-01 – 2023-05-09 (×9): 1 via RESPIRATORY_TRACT
  Filled 2023-05-01 (×2): qty 7

## 2023-05-01 MED ORDER — SODIUM CHLORIDE 0.9 % IV SOLN
3.0000 g | Freq: Two times a day (BID) | INTRAVENOUS | Status: DC
  Administered 2023-05-02 (×2): 3 g via INTRAVENOUS
  Filled 2023-05-01 (×2): qty 8

## 2023-05-01 MED ORDER — METHYLPREDNISOLONE SODIUM SUCC 40 MG IJ SOLR
40.0000 mg | Freq: Every day | INTRAMUSCULAR | Status: DC
  Administered 2023-05-01 – 2023-05-02 (×2): 40 mg via INTRAVENOUS
  Filled 2023-05-01 (×2): qty 1

## 2023-05-01 MED ORDER — MAGNESIUM SULFATE 2 GM/50ML IV SOLN
2.0000 g | Freq: Once | INTRAVENOUS | Status: AC
  Administered 2023-05-01: 2 g via INTRAVENOUS
  Filled 2023-05-01: qty 50

## 2023-05-01 MED ORDER — IPRATROPIUM-ALBUTEROL 0.5-2.5 (3) MG/3ML IN SOLN
3.0000 mL | Freq: Three times a day (TID) | RESPIRATORY_TRACT | Status: DC
  Administered 2023-05-01 – 2023-05-03 (×6): 3 mL via RESPIRATORY_TRACT
  Filled 2023-05-01 (×6): qty 3

## 2023-05-01 NOTE — Evaluation (Signed)
Occupational Therapy Evaluation Patient Details Name: RAIANNA NOVOTNEY MRN: [id number] DOB: June 06, 1933 Today's Date: 05/01/2023   History of Present Illness 89 y/o female presented to ED on 04/29/86 for L hip cyst and pain x 1 week as well as SOB and LE edema. Admitted for acute hypoxic respiratory failure and acute exacerbation of CHF. PMH: sick sinus syndrome s/p pacemaker placement 4/25, HTN, Afib, DM, CKD stage 3, COPD   Clinical Impression   Ms Methot was seen for OT/PT evaluation this date. Prior to hospital admission, pt was MOD I using rollator. Pt lives alone at senior living apartments. Pt currently requires SUPERVISION for bed mobility, use of rails and bed control. CGA + RW for ADL t/f, tolerates ~25 ft. SpO2 91% on RA. Good static standing balance. Pt would benefit from skilled OT to address noted impairments and functional limitations (see below for any additional details). Upon hospital discharge, recommend OT follow up.    If plan is discharge home, recommend the following: A little help with walking and/or transfers;A little help with bathing/dressing/bathroom;Help with stairs or ramp for entrance    Functional Status Assessment  Patient has had a recent decline in their functional status and demonstrates the ability to make significant improvements in function in a reasonable and predictable amount of time.  Equipment Recommendations  BSC/3in1    Recommendations for Other Services       Precautions / Restrictions Precautions Precautions: Fall Restrictions Weight Bearing Restrictions: No      Mobility Bed Mobility Overal bed mobility: Needs Assistance Bed Mobility: Supine to Sit     Supine to sit: Supervision, HOB elevated, Used rails          Transfers Overall transfer level: Needs assistance Equipment used: Rolling walker (2 wheels) Transfers: Sit to/from Stand Sit to Stand: Supervision                  Balance Overall balance assessment: Needs  assistance Sitting-balance support: No upper extremity supported, Feet supported Sitting balance-[name]: Good     Standing balance support: Bilateral upper extremity supported, Reliant on assistive device for balance Standing balance-[name]: Fair                             ADL either performed or assessed with clinical judgement   ADL Overall ADL's : Needs assistance/impaired                                       General ADL Comments: CGA + R for toilet t/f.       Pertinent Vitals/Pain Pain Assessment Pain Assessment: Faces Faces Pain Scale: Hurts little more Pain Location: L hip Pain Descriptors / Indicators: Grimacing, Guarding, Discomfort Pain Intervention(s): Limited activity within patient's tolerance, Repositioned     Extremity/Trunk Assessment Upper Extremity Assessment Upper Extremity Assessment: Generalized weakness   Lower Extremity Assessment Lower Extremity Assessment: Generalized weakness   Cervical / Trunk Assessment Cervical / Trunk Assessment: Kyphotic   Communication Communication Communication: No apparent difficulties   Cognition Arousal: Alert Behavior During Therapy: WFL for tasks assessed/performed Overall Cognitive Status: Within Functional Limits for tasks assessed                                       General  Comments  SpO2 91% on RA     Home Living Family/patient expects to be discharged to:: Private residence Living Arrangements: Alone Available Help at Discharge: Family Type of Home: Apartment Home Access: Level entry     Home Layout: One level     Bathroom Shower/Tub: Chief Strategy Officer: Handicapped height Bathroom Accessibility: Yes   Home Equipment: Rollator (4 wheels);Cane - single point;Grab bars - tub/shower;Grab bars - toilet;Lift chair;Transport chair   Additional Comments: Senior living apartment      Prior Functioning/Environment Prior  Level of Function : Needs assist             Mobility Comments: was independent prior to admission in July. States she ambulates in the household with rollator ADLs Comments: daughter came from [place] for a month, now pt returned to [place]        OT Problem List: Decreased strength;Decreased activity tolerance;Impaired balance (sitting and/or standing)      OT Treatment/Interventions: Self-care/ADL training;Therapeutic exercise;DME and/or AE instruction;Energy conservation;Therapeutic activities;Patient/family education;Balance training    OT Goals(Current goals can be found in the care plan section) Acute Rehab OT Goals Patient Stated Goal: to go home OT Goal Formulation: With patient Time For Goal Achievement: 05/15/23 Potential to Achieve Goals: Good ADL Goals Pt Will Perform Grooming: with modified independence;standing Pt Will Perform Lower Body Dressing: with modified independence;sit to/from stand Pt Will Transfer to Toilet: with modified independence;ambulating;regular height toilet  OT Frequency: Min 1X/week    Co-evaluation              AM-PAC OT "6 Clicks" Daily Activity     Outcome Measure Help from another person eating meals?: None Help from another person taking care of personal grooming?: A Little Help from another person toileting, which includes using toliet, bedpan, or urinal?: A Little Help from another person bathing (including washing, rinsing, drying)?: A Little Help from another person to put on and taking off regular upper body clothing?: None Help from another person to put on and taking off regular lower body clothing?: A Little 6 Click Score: 20   End of Session    Activity Tolerance: Patient tolerated treatment well Patient left: in chair;with call bell/phone within reach  OT Visit Diagnosis: Other abnormalities of gait and mobility (R26.89);Unsteadiness on feet (R26.81)                Time: 1356-1410 OT Time Calculation (min): 14  min Charges:  OT General Charges $OT Visit: 1 Visit OT Evaluation $OT Eval Low Complexity: 1 Low  Kathie Dike, M.S. OTR/L  05/01/23, 2:32 PM  ascom [phone]

## 2023-05-01 NOTE — Consult Note (Signed)
PHARMACY NOTE:  ANTIMICROBIAL RENAL DOSAGE ADJUSTMENT  Current antimicrobial regimen includes a mismatch between antimicrobial dosage and estimated renal function.  As per policy approved by the Pharmacy & Therapeutics and Medical Executive Committees, the antimicrobial dosage will be adjusted accordingly.  Current antimicrobial dosage:  Unasyn 3 q8H   Indication: Asp PNA  Renal Function:  Estimated Creatinine Clearance: 28.9 mL/min (A) (by C-G formula based on SCr of 1.57 mg/dL (H)). []      On intermittent HD, scheduled: []      On CRRT    Antimicrobial dosage has been changed to:  Unasyn 3 g q12H  Additional comments:   Thank you for allowing pharmacy to be a part of this patient's care.  Ronnald Ramp, [facility] 05/01/2023 1:55 PM

## 2023-05-01 NOTE — ED Notes (Signed)
Informed RN April via chat/ pt has bed assigned

## 2023-05-01 NOTE — Assessment & Plan Note (Addendum)
BMI 27.71

## 2023-05-01 NOTE — Evaluation (Signed)
Physical Therapy Evaluation Patient Details Name: Leslie Parker MRN: [id number] DOB: 1933-05-29 Today's Date: 05/01/2023  History of Present Illness  89 y/o female presented to ED on 04/30/23 for L hip cyst and pain x 1 week as well as SOB and LE edema. Admitted for acute hypoxic respiratory failure and acute exacerbation of CHF. PMH: sick sinus syndrome s/p pacemaker placement 4/25, HTN, Afib, DM, CKD stage 3, COPD  Clinical Impression  Patient admitted with the above. PTA, patient lives alone in a senior living apartment and utilizes rollator for mobility. Patient presents with weakness, impaired balance, decreased activity tolerance, and L hip pain. Patient required supervision for bed mobility and sit to stand. Ambulatory within room with RW and supervision. VSS on RA. Complaining of L hip pain/soreness during mobility. Patient will benefit from skilled PT services during acute stay to address listed deficits. Patient will benefit from ongoing therapy at discharge to maximize functional independence and safety.         If plan is discharge home, recommend the following: A little help with walking and/or transfers;A little help with bathing/dressing/bathroom   Can travel by private vehicle        Equipment Recommendations Rolling Jacorian Golaszewski (2 wheels)  Recommendations for Other Services       Functional Status Assessment Patient has had a recent decline in their functional status and demonstrates the ability to make significant improvements in function in a reasonable and predictable amount of time.     Precautions / Restrictions Precautions Precautions: Fall Restrictions Weight Bearing Restrictions: No      Mobility  Bed Mobility Overal bed mobility: Needs Assistance Bed Mobility: Supine to Sit     Supine to sit: Supervision, HOB elevated, Used rails          Transfers Overall transfer level: Needs assistance Equipment used: Rolling Timur Nibert (2 wheels) Transfers: Sit  to/from Stand Sit to Stand: Supervision           General transfer comment: supervision for safety    Ambulation/Gait Ambulation/Gait assistance: Supervision Gait Distance (Feet): 25 Feet Assistive device: Rolling Franklin Clapsaddle (2 wheels) Gait Pattern/deviations: Step-through pattern, Decreased stride length Gait velocity: decreased     General Gait Details: supervision for safety. complaining of L hip soreness  Stairs            Wheelchair Mobility     Tilt Bed    Modified Rankin (Stroke Patients Only)       Balance Overall balance assessment: Needs assistance Sitting-balance support: No upper extremity supported, Feet supported Sitting balance-[name]: Good     Standing balance support: Bilateral upper extremity supported, Reliant on assistive device for balance Standing balance-[name]: Fair                               Pertinent Vitals/Pain Pain Assessment Pain Assessment: Faces Faces Pain Scale: Hurts little more Pain Location: L hip Pain Descriptors / Indicators: Grimacing, Guarding, Discomfort Pain Intervention(s): Monitored during session, Limited activity within patient's tolerance, Repositioned    Home Living Family/patient expects to be discharged to:: Private residence Living Arrangements: Alone Available Help at Discharge: Family Type of Home: Apartment Home Access: Level entry       Home Layout: One level Home Equipment: Rollator (4 wheels);Cane - single point;Grab bars - tub/shower;Grab bars - toilet;Lift chair;Transport chair Additional Comments: Senior living apartment    Prior Function Prior Level of Function : Needs assist  Mobility Comments: was independent prior to admission in July. States she ambulates in the household with rollator ADLs Comments: daughter came from [place] for a month, now pt returned to [place]     Extremity/Trunk Assessment   Upper Extremity Assessment Upper Extremity  Assessment: Generalized weakness    Lower Extremity Assessment Lower Extremity Assessment: Generalized weakness    Cervical / Trunk Assessment Cervical / Trunk Assessment: Kyphotic  Communication   Communication Communication: No apparent difficulties  Cognition Arousal: Alert Behavior During Therapy: WFL for tasks assessed/performed Overall Cognitive Status: Within Functional Limits for tasks assessed                                          General Comments General comments (skin integrity, edema, etc.): SpO2 91% on RA    Exercises     Assessment/Plan    PT Assessment Patient needs continued PT services  PT Problem List Decreased strength;Decreased activity tolerance;Decreased balance;Decreased mobility;Pain       PT Treatment Interventions DME instruction;Gait training;Functional mobility training;Therapeutic activities;Therapeutic exercise;Balance training;Patient/family education    PT Goals (Current goals can be found in the Care Plan section)  Acute Rehab PT Goals Patient Stated Goal: to go home PT Goal Formulation: With patient Time For Goal Achievement: 05/15/23 Potential to Achieve Goals: Good    Frequency Min 1X/week     Co-evaluation               AM-PAC PT "6 Clicks" Mobility  Outcome Measure Help needed turning from your back to your side while in a flat bed without using bedrails?: A Little Help needed moving from lying on your back to sitting on the side of a flat bed without using bedrails?: A Little Help needed moving to and from a bed to a chair (including a wheelchair)?: A Little Help needed standing up from a chair using your arms (e.g., wheelchair or bedside chair)?: A Little Help needed to walk in hospital room?: A Little Help needed climbing 3-5 steps with a railing? : A Lot 6 Click Score: 17    End of Session   Activity Tolerance: Patient tolerated treatment well Patient left: in chair;with call bell/phone  within reach Nurse Communication: Mobility status PT Visit Diagnosis: Unsteadiness on feet (R26.81);Muscle weakness (generalized) (M62.81);Other abnormalities of gait and mobility (R26.89)    Time: 1356-1410 PT Time Calculation (min) (ACUTE ONLY): 14 min   Charges:   PT Evaluation $PT Eval Moderate Complexity: 1 Mod   PT General Charges $$ ACUTE PT VISIT: 1 Visit         [name], PT, DPT Physical Therapist - [facility]  [facility]   Adilene Areola A Horacio Werth 05/01/2023, 2:34 PM

## 2023-05-01 NOTE — Assessment & Plan Note (Addendum)
No steroids with acute delirium.  Continue albuterol nebulizers.  Patient takes Trelegy at home.  Continue Trelegy substitute here.  Continue empiric antibiotics Augmentin and Zithromax.

## 2023-05-01 NOTE — ED Notes (Signed)
Pt denies needs.  

## 2023-05-01 NOTE — Discharge Instructions (Signed)

## 2023-05-01 NOTE — Progress Notes (Signed)
Patient arrived to room 255 from ED.  Assessment complete, VS obtained, and Admission database began.

## 2023-05-01 NOTE — Progress Notes (Signed)
Progress Note   Patient: Leslie Parker UEA:[id number] DOB: [birth year] DOA: 04/30/2023     1 DOS: the patient was seen and examined on 05/01/2023   Brief hospital course: 89 year old female past medical history of chronic diastolic dysfunction, pacemaker, history of atrial fibrillation on chronic Eliquis, type 2 diabetes mellitus with stage III chronic kidney disease, COPD, recent left hip arthroplasty.  She has had worsening hip pain over the last week found to have an elevated blood pressure came in with shortness of breath and found to be hypoxic.  10/9.  Started on Solu-Medrol secondary to diffuse wheezing and COPD exacerbation.  Replacing IV magnesium.  Assessment and Plan: * Acute hypoxic respiratory failure (HCC) Patient was noted to be tachypneic and had room air pulse oximetry of 88% This morning was off oxygen.   COPD with acute exacerbation (HCC) Start Solu-Medrol.  Continue albuterol nebulizers.  Patient takes Trelegy at home.  Continue Trelegy substitute here.  Acute on chronic diastolic CHF (congestive heart failure) (HCC) Patient on IV Lasix.  Replace potassium.  Last echocardiogram shows an EF of 60%  Paroxysmal atrial fibrillation (HCC) Continue amiodarone, metoprolol and Cardizem for rate control Continue Eliquis as primary prophylaxis for an acute stroke  Type II diabetes mellitus with renal manifestations (HCC) Patient with diabetes mellitus with complications of stage IIIb chronic kidney disease Hold glipizide Monitor renal function closely while on diuretic therapy Maintain consistent carbohydrate diet Last hemoglobin A1c is 5.8   Left hip pain Seen by orthopedic and recommended PT evaluation.  Overweight (BMI 25.0-29.9) BMI 27.74  Myocardial injury Likely secondary to acute respiratory failure, CHF and COPD exacerbation.  Hypokalemia Replace orally  Hypomagnesemia Replace IV today and recheck level tomorrow.       Subjective: Patient states  that she does not need oxygen.  Came in with some shortness of breath.  Was given IV Lasix.  Having some wheezing.  Physical Exam: Vitals:   05/01/23 0500 05/01/23 0528 05/01/23 0759 05/01/23 1223  BP: (!) 139/58 (!) 151/104 (!) 162/72 (!) 158/65  Pulse: 60 63 65 61  Resp: (!) 26 20 16 16   Temp:  98.2 F (36.8 C) 97.8 F (36.6 C) 98.1 F (36.7 C)  TempSrc:   Oral Oral  SpO2: 98% 95% 100% 92%  Weight:      Height:       Physical Exam HENT:     Head: Normocephalic.     Mouth/Throat:     Pharynx: No oropharyngeal exudate.  Eyes:     General: Lids are normal.     Conjunctiva/sclera: Conjunctivae normal.  Cardiovascular:     Rate and Rhythm: Normal rate and regular rhythm.     Heart sounds: S1 normal and S2 normal. Murmur heard.     Systolic murmur is present with a grade of 2/6.  Pulmonary:     Breath sounds: Examination of the right-middle field reveals wheezing. Examination of the left-middle field reveals wheezing. Examination of the right-lower field reveals decreased breath sounds and wheezing. Examination of the left-lower field reveals decreased breath sounds and wheezing. Decreased breath sounds and wheezing present. No rhonchi or rales.  Abdominal:     Palpations: Abdomen is soft.     Tenderness: There is no abdominal tenderness.  Musculoskeletal:     Right lower leg: Swelling present.     Left lower leg: Swelling present.  Skin:    General: Skin is warm.     Findings: No rash.  Neurological:  Mental Status: She is alert and oriented to person, place, and time.     Data Reviewed: Potassium 3.1, magnesium 1.2 yesterday, white blood cell count 5.6, hemoglobin 10.9, platelet count 190  Family Communication:   Disposition: Status is: Inpatient Remains inpatient appropriate because: Starting Solu-Medrol for wheezing.  Will give IV magnesium also for hypomagnesemia  Planned Discharge Destination: Home    Time spent: 28 minutes  Author: Alford Highland,  MD 05/01/2023 1:35 PM  For on call review [email or web address].

## 2023-05-01 NOTE — Progress Notes (Signed)
Patient ID: Leslie Parker, female   DOB: Jul 24, 1932, 89 y.o.   MRN: [id number]  Subjective: The patient has no new complaints pertaining to her left hip this morning.   Objective: Vital signs in last 24 hours: Temp:  [97.2 F (36.2 C)-98.7 F (37.1 C)] 97.8 F (36.6 C) (10/09 0759) Pulse Rate:  [57-96] 65 (10/09 0759) Resp:  [12-26] 16 (10/09 0759) BP: (137-213)/(58-104) 162/72 (10/09 0759) SpO2:  [91 %-100 %] 100 % (10/09 0759) Weight:  [85.2 kg] 85.2 kg (10/08 0940)  Intake/Output from previous day: 10/08 0701 - 10/09 0700 In: 100 [IV Piggyback:100] Out: -  Intake/Output this shift: No intake/output data recorded.  Recent Labs    04/30/23 1050 05/01/23 0245  HGB 13.6 10.9*   Recent Labs    04/30/23 1050 05/01/23 0245  WBC 5.9 5.6  RBC 4.52 3.60*  HCT 39.6 33.0*  PLT 247 190   Recent Labs    04/30/23 1050 05/01/23 0245  NA 137 136  K 3.1* 3.1*  CL 100 101  CO2 24 24  BUN 27* 26*  CREATININE 1.50* 1.57*  GLUCOSE 114* 111*  CALCIUM 8.7* 7.9*   No results for input(s): "LABPT", "INR" in the last 72 hours.  Physical Exam: Orthopedic examination again is limited to the right hip and lower extremity.  These findings are unchanged as compared to her findings from yesterday afternoon.  She remains grossly neurovascularly intact to the left lower extremity and foot.  X-rays: The official reading of the CT scan of her pelvis and left hip confirms that there are no acute fractures or other acute bony abnormalities.  Based on the study, there does not appear to be a fluid collection over the lateral aspect of her hip either.  Assessment: Lateral sided left hip pain of unclear etiology status post multiple prior left hip surgeries culminating in a left total hip arthroplasty.  Plan: The treatment options again are reviewed with the patient.  The patient may be mobilized with physical therapy, weightbearing as tolerated to the left lower extremity.  If she is unable to  progress with ambulation due to continued lateral sided left hip pain, an ultrasound or MRI scan of the left hip may be considered for additional evaluation of the soft tissues around the lateral aspect of her left hip.   Leslie Parker 05/01/2023, 8:06 AM

## 2023-05-01 NOTE — Evaluation (Signed)
Clinical/Bedside Swallow Evaluation Patient Details  Name: Leslie Parker MRN: [id number] Date of Birth: 12/20/1932  Today's Date: 05/01/2023 Time: SLP Start Time (ACUTE ONLY): 0828 SLP Stop Time (ACUTE ONLY): 0840 SLP Time Calculation (min) (ACUTE ONLY): 12 min  Past Medical History:  Past Medical History:  Diagnosis Date   Asthma    Atrial fibrillation with RVR (HCC)    Cancer (HCC)    Basal Cell   Diabetes mellitus without complication (HCC)    Heart murmur    Hypertension    Impetigo    Osteoarthritis    Osteopenia    Vomiting    can not due to surgery   Wears dentures    full upper and lower   Past Surgical History:  Past Surgical History:  Procedure Laterality Date   ABDOMINAL HYSTERECTOMY     BACK SURGERY     BLADDER SURGERY     mesh   CARPAL TUNNEL RELEASE Bilateral    CATARACT EXTRACTION Right 2017   CATARACT EXTRACTION W/PHACO Left 02/06/2016   Procedure: CATARACT EXTRACTION PHACO AND INTRAOCULAR LENS PLACEMENT (IOC) left eye;  Surgeon: Sherald Hess, MD;  Location: [facility];  Service: Ophthalmology;  Laterality: Left;  DIABETIC LEFT Cannot arrive before 9:30   CERVICAL DISC SURGERY     CHOLECYSTECTOMY     EYE SURGERY Bilateral    Cataract Extraction with IOL   FEMUR IM NAIL Left 02/04/2015   Procedure: INTRAMEDULLARY (IM) NAIL FEMORAL;  Surgeon: Juanell Fairly, MD;  Location: [facility];  Service: Orthopedics;  Laterality: Left;   HARDWARE REMOVAL Left 01/17/2017   Procedure: HARDWARE REMOVAL;  Surgeon: Juanell Fairly, MD;  Location: [facility];  Service: Orthopedics;  Laterality: Left;   HERNIA REPAIR  2014   esophageal and gastric mesh. patient unable to throw up d/t mesh   HIP ARTHROPLASTY Left 01/26/2017   Procedure: ARTHROPLASTY BIPOLAR HIP (HEMIARTHROPLASTY) removal hardware left hip;  Surgeon: Deeann Saint, MD;  Location: [facility];  Service: Orthopedics;  Laterality: Left;   INTRAMEDULLARY (IM) NAIL INTERTROCHANTERIC Right 02/13/2023    Procedure: INTRAMEDULLARY (IM) NAIL INTERTROCHANTERIC;  Surgeon: Christena Flake, MD;  Location: [facility];  Service: Orthopedics;  Laterality: Right;   JOINT REPLACEMENT Bilateral    knees   PACEMAKER IMPLANT N/A 11/15/2022   Procedure: PACEMAKER IMPLANT;  Surgeon: Marcina Millard, MD;  Location: [facility];  Service: Cardiovascular;  Laterality: N/A;   PACEMAKER IMPLANT N/A 11/28/2022   Procedure: PACEMAKER IMPLANT;  Surgeon: Marcina Millard, MD;  Location: [facility];  Service: Cardiovascular;  Laterality: N/A;  Lead reposition   REPLACEMENT TOTAL KNEE BILATERAL Bilateral [id number]   SHOULDER ARTHROSCOPY WITH OPEN ROTATOR CUFF REPAIR AND DISTAL CLAVICLE ACROMINECTOMY Left 10/25/2016   Procedure: SHOULDER ARTHROSCOPY WITH OPEN ROTATOR CUFF REPAIR AND DISTAL CLAVICLE ACROMINECTOMY;  Surgeon: Juanell Fairly, MD;  Location: [facility];  Service: Orthopedics;  Laterality: Left;   THYROID SURGERY     goiter removed   TOTAL HIP REVISION Left 12/02/2017   Procedure: TOTAL HIP REVISION;  Surgeon: Lyndle Herrlich, MD;  Location: [facility];  Service: Orthopedics;  Laterality: Left;   TOTAL SHOULDER REPLACEMENT Right 2012   HPI:  Pt is an 89 y.o. female who presented with c/p L hip pain. Pt also had BLE swelling, non-prodcutive cough and diffuse wheezing. Pt with medical history significant for chronic diastolic dysfunction CHF with last known LVEF of 60 to 65%, 2D echocardiogram which was done 07/24, status post permanent pacemaker placement for  sick sinus syndrome, history of A-fib on chronic anticoagulation with Eliquis, diabetes mellitus with complications of stage IIIb chronic kidney disease, COPD, status post recent left hip arthroplasty following a hip fracture. Pt s/p hernia repair with gastric mesh placement in 2014. CXR on admit negative.    Assessment / Plan / Recommendation  Clinical Impression  Pt seen for clinical swallowing evaluation. Pt alert, pleasant, and  cooperative. Consuming breakfast upon SLP entrance to room. Denies dysphagia. Pt demonstrated an intact oral swallow. Pharyngeal swallow appeared [facility] per clinical assessment. Recommend continuation of current diet with standard aspiration precautions/reflux precautions. Reviewed diet rec's, safe swallowing strategies/reflux precautions, and risk factors for dysphagia (with emphasis on relationship between breathing/swallowing). Pt verbalized understanding/agreement with education provided. SLP to sign off as pt has no acute SLP needs at this time. SLP Visit Diagnosis: Dysphagia, unspecified (R13.10)    Aspiration Risk  Mild aspiration risk    Diet Recommendation Regular;Thin liquid    Liquid Administration via: Spoon;Cup;Straw Medication Administration:  (as tolerated) Supervision: Patient able to self feed Compensations: Slow rate;Small sips/bites (rest breaks PRN) Postural Changes: Seated upright at 90 degrees (upright 60-90 minutes after POs; reflux precautions)    Other  Recommendations Oral Care Recommendations: Oral care BID;Patient independent with oral care    Recommendations for follow up therapy are one component of a multi-disciplinary discharge planning process, led by the attending physician.  Recommendations may be updated based on patient status, additional functional criteria and insurance authorization.  Follow up Recommendations No SLP follow up         Functional Status Assessment Patient has not had a recent decline in their functional status         Prognosis Prognosis for improved oropharyngeal function: Good      Swallow Study   General Date of Onset: 04/30/23 HPI: Pt is an 89 y.o. female who presented with c/p L hip pain. Pt also had BLE swelling, non-prodcutive cough and diffuse wheezing. Pt with medical history significant for chronic diastolic dysfunction CHF with last known LVEF of 60 to 65%, 2D echocardiogram which was done 07/24, status post permanent  pacemaker placement for sick sinus syndrome, history of A-fib on chronic anticoagulation with Eliquis, diabetes mellitus with complications of stage IIIb chronic kidney disease, COPD, status post recent left hip arthroplasty following a hip fracture. Pt s/p hernia repair with gastric mesh placement in 2014. CXR on admit negative. Type of Study: Bedside Swallow Evaluation Diet Prior to this Study: Regular;Thin liquids (Level 0) Temperature Spikes Noted: No Respiratory Status: Room air History of Recent Intubation: No Behavior/Cognition: Alert;Cooperative;Pleasant mood Oral Cavity Assessment: Within Functional Limits Oral Care Completed by SLP: No Oral Cavity - Dentition: Dentures, top;Dentures, bottom Vision: Functional for self-feeding Self-Feeding Abilities: Able to feed self Patient Positioning: Upright in bed Baseline Vocal Quality: Normal Volitional Cough: Strong Volitional Swallow: Able to elicit    Oral/Motor/Sensory Function Overall Oral Motor/Sensory Function: Within functional limits   Ice Chips Ice chips: Not tested   Thin Liquid Thin Liquid: Within functional limits Presentation: Self Fed;Cup;Straw    Nectar Thick Nectar Thick Liquid: Not tested   Honey Thick Honey Thick Liquid: Not tested   Puree Puree: Not tested   Solid     Solid: Within functional limits Presentation: Self Fed;Spoon     Clyde Canterbury, M.S., CCC-SLP Speech-Language Pathologist [facility] [phone] Arnette Felts)  Alessandra Bevels Guy Seese 05/01/2023,9:04 AM

## 2023-05-01 NOTE — TOC Initial Note (Signed)
Transition of Care [facility]) - Initial/Assessment Note    Patient Details  Name: Leslie Parker MRN: [id number] Date of Birth: 08-28-1932  Transition of [facility]) CM/SW Contact:    Darolyn Rua, LCSW Phone Number: 05/01/2023, 10:33 AM  Clinical Narrative:                  Patient with high readmission score, per chart review patient noted to have recently dc from hospital :  Admitted [id number] hip pain Admitted from: Home alone. Lives in a senior apartment [facility]acy[facility]  Current home health/prior home health/DME:[facility] HH: active with [facility] per [place] - RN PT and OT Transportation: granddaughter takes her appointments and will transport her home at discharge.   Please consult TOC should additional discharge needs arise    Barriers to Discharge: Continued Medical Work up   Patient Goals and CMS Choice   CMS Medicare.gov Compare Post Acute Care list provided to:: Patient Choice offered to / list presented to : Patient      Expected Discharge Plan and Services       Living arrangements for the past 2 months: Single Family Home                                      Prior Living Arrangements/Services Living arrangements for the past 2 months: Single Family Home Lives with:: Self                   Activities of Daily Living   ADL Screening (condition at time of admission) Independently performs ADLs?: Yes (appropriate for developmental age) Is the patient deaf or have difficulty hearing?: No Does the patient have difficulty seeing, even when wearing glasses/contacts?: No Does the patient have difficulty concentrating, remembering, or making decisions?: No  Permission Sought/Granted                  Emotional Assessment              Admission diagnosis:  Left hip pain [M25.552] Acute respiratory failure with hypoxia (HCC) [J96.01] Acute on chronic diastolic CHF (congestive heart failure) (HCC)  [I50.33] Fall, initial encounter [W19.XXXA] Patient Active Problem List   Diagnosis Date Noted   Hypokalemia 04/30/2023   Anemia 03/21/2023   Abnormal LFTs 03/20/2023   Closed right hip fracture (HCC) 02/13/2023   Fall at home, initial encounter 02/13/2023   Type II diabetes mellitus with renal manifestations (HCC) 02/13/2023   Chronic diastolic CHF (congestive heart failure) (HCC) 02/13/2023   COPD (chronic obstructive pulmonary disease) (HCC) 02/13/2023   Leukocytosis 02/13/2023   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   AKI (acute kidney injury) (HCC) 11/24/2022   CKD stage 3 due to type 2 diabetes mellitus (HCC) 11/24/2022   UTI (urinary tract infection) 11/24/2022   Bradycardia 11/24/2022   Paroxysmal atrial fibrillation (HCC) 11/24/2022   Sick sinus syndrome (HCC) 11/15/2022   Acute on chronic diastolic CHF (congestive heart failure) (HCC) 08/27/2022   COPD exacerbation (HCC) 08/23/2022   Chronic a-fib (HCC) 08/22/2022   Respiratory distress 08/22/2022   Atrial fibrillation with rapid ventricular response (HCC) 06/06/2022   Rapid atrial fibrillation, new onset(HCC) 06/05/2022   Diabetes mellitus without complication (HCC)    Stage 3b chronic kidney disease (HCC)    Lactic acidosis    Postural dizziness with presyncope    Elevated troponin    Community acquired pneumonia  Hypomagnesemia    Sepsis (HCC) 09/03/2020   Acute cystitis without hematuria    Troponin I above reference range    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 [place] [address] Phone: [phone] Fax: [phone]  [id number] [place] [place] - [address] [place] [address] Phone: [phone] Fax: [phone]  [id number],  - [address] [place] [address] Phone: [phone] Fax: [phone]     Social Determinants of Health (SDOH) Social History: SDOH Screenings   Food Insecurity: No Food Insecurity (05/01/2023)  Housing: Low Risk  (05/01/2023)  Transportation Needs: No Transportation Needs (05/01/2023)  Utilities: Not At Risk (05/01/2023)  Tobacco Use: Low Risk  (05/01/2023)   SDOH Interventions:     Readmission Risk Interventions    03/21/2023    1:12 PM 08/27/2022   12:05 PM  Readmission Risk Prevention Plan  Transportation Screening Complete Complete  PCP or Specialist Appt within 3-5 Days Complete Complete  HRI or Home Care Consult Complete Complete  Social Work Consult for Recovery Care Planning/Counseling Complete   Palliative Care Screening Not Applicable   Medication Review Oceanographer) Complete Complete

## 2023-05-01 NOTE — Assessment & Plan Note (Signed)
Likely secondary to acute respiratory failure, CHF and COPD exacerbation.

## 2023-05-01 NOTE — Assessment & Plan Note (Signed)
Seen by orthopedic and recommended PT

## 2023-05-01 NOTE — Assessment & Plan Note (Signed)
Replaced. °

## 2023-05-01 NOTE — Hospital Course (Addendum)
89 year old female past medical history of chronic diastolic dysfunction, pacemaker, history of atrial fibrillation on chronic Eliquis, type 2 diabetes mellitus with stage III chronic kidney disease, COPD, recent left hip arthroplasty.  She has had worsening hip pain over the last week found to have an elevated blood pressure came in with shortness of breath and found to be hypoxic.  10/9.  Started on Solu-Medrol secondary to diffuse wheezing and COPD exacerbation.  Replacing IV magnesium. 10/10.  Creatinine up to 2.04.  Holding Lasix.  Continue Solu-Medrol today. 10/11.  Creatinine up to 2.47.  Will give fluid bolus today.  Hold steroids with confusion.  Has upper airway wheeze.  Continue nebulizer. 10/12.  Patient with acute delirium.  Creatinine improved to 2.28.  Spoke with granddaughter on the phone patient does live alone. 10/13.  Creatinine 2.43.  Fluid bolus given. 10/14 creatinine 2.45.  Will give 2 fluid boluses today.

## 2023-05-01 NOTE — Consult Note (Signed)
[facility] CARDIOLOGY CONSULT NOTE       Patient ID: Leslie Parker MRN: [id number] DOB/AGE: 31-Jul-1932 88 y.o.  Admit date: 04/30/2023 Referring Physician Dr. Joylene Igo Primary Physician Dr. Nemiah Commander Primary Cardiologist Dr. Juliann Pares Reason for Consultation AoCHFpEF  HPI: Leslie Parker is a 89 y.o. female  with a past medical history of chronic diastolic heart failure, sick sinus syndrome s/p [employer] DC PPM implant 11/15/2022, paroxysmal AF (eliquis), DM2, COPD who presented to the ED on 04/30/2023 for hip pain. She also endorsed worsening SOB and LE edema. Cardiology was consulted for further evaluation.   Patient reports that over the last week she has been having worsening L hip pain. Previously had hip replacement about 5 years ago. States that she feels like she has a knot on her hip. Decided to come to the ED for further evaluation of this. Also reported that she has noticed worsening SOB and LE edema for the last week or so. Workup in the ED notable for creatinine 1.5, potassium 3.1, hemoglobin 13.6.  BNP elevated at 748.  Troponins trended 51 > 68 > 74.  EKG showed sinus rhythm, nonacute.  Noted to be hypertensive in the ED.  She was given IV Lasix in the ED.  X-ray and CT of her hip without evidence of acute fracture or infection.  At the time of my evaluation this morning patient is resting comfortably in hospital bed.  She reports that overall she feels that her breathing has been better today than when she first came to the ED.  Appears to be tolerating IV diuresis well.  She reports good urine output.  She denies any recent episodes of chest pain or exertional symptoms.  States that she has missed some doses of her home medications.  Otherwise she denies any episodes of dizziness, palpitations, syncope.  Review of systems complete and found to be negative unless listed above    Past Medical History:  Diagnosis Date   Asthma    Atrial fibrillation with RVR (HCC)     Cancer (HCC)    Basal Cell   Diabetes mellitus without complication (HCC)    Heart murmur    Hypertension    Impetigo    Osteoarthritis    Osteopenia    Vomiting    can not due to surgery   Wears dentures    full upper and lower    Past Surgical History:  Procedure Laterality Date   ABDOMINAL HYSTERECTOMY     BACK SURGERY     BLADDER SURGERY     mesh   CARPAL TUNNEL RELEASE Bilateral    CATARACT EXTRACTION Right 2017   CATARACT EXTRACTION W/PHACO Left 02/06/2016   Procedure: CATARACT EXTRACTION PHACO AND INTRAOCULAR LENS PLACEMENT (IOC) left eye;  Surgeon: Sherald Hess, MD;  Location: [facility];  Service: Ophthalmology;  Laterality: Left;  DIABETIC LEFT Cannot arrive before 9:30   CERVICAL DISC SURGERY     CHOLECYSTECTOMY     EYE SURGERY Bilateral    Cataract Extraction with IOL   FEMUR IM NAIL Left 02/04/2015   Procedure: INTRAMEDULLARY (IM) NAIL FEMORAL;  Surgeon: Juanell Fairly, MD;  Location: [facility];  Service: Orthopedics;  Laterality: Left;   HARDWARE REMOVAL Left 01/17/2017   Procedure: HARDWARE REMOVAL;  Surgeon: Juanell Fairly, MD;  Location: [facility];  Service: Orthopedics;  Laterality: Left;   HERNIA REPAIR  2014   esophageal and gastric mesh. patient unable to throw up d/t mesh   HIP ARTHROPLASTY  Left 01/26/2017   Procedure: ARTHROPLASTY BIPOLAR HIP (HEMIARTHROPLASTY) removal hardware left hip;  Surgeon: Deeann Saint, MD;  Location: [facility];  Service: Orthopedics;  Laterality: Left;   INTRAMEDULLARY (IM) NAIL INTERTROCHANTERIC Right 02/13/2023   Procedure: INTRAMEDULLARY (IM) NAIL INTERTROCHANTERIC;  Surgeon: Christena Flake, MD;  Location: [facility];  Service: Orthopedics;  Laterality: Right;   JOINT REPLACEMENT Bilateral    knees   PACEMAKER IMPLANT N/A 11/15/2022   Procedure: PACEMAKER IMPLANT;  Surgeon: Marcina Millard, MD;  Location: [facility];  Service: Cardiovascular;  Laterality: N/A;   PACEMAKER IMPLANT N/A 11/28/2022    Procedure: PACEMAKER IMPLANT;  Surgeon: Marcina Millard, MD;  Location: [facility];  Service: Cardiovascular;  Laterality: N/A;  Lead reposition   REPLACEMENT TOTAL KNEE BILATERAL Bilateral [id number]   SHOULDER ARTHROSCOPY WITH OPEN ROTATOR CUFF REPAIR AND DISTAL CLAVICLE ACROMINECTOMY Left 10/25/2016   Procedure: SHOULDER ARTHROSCOPY WITH OPEN ROTATOR CUFF REPAIR AND DISTAL CLAVICLE ACROMINECTOMY;  Surgeon: Juanell Fairly, MD;  Location: [facility];  Service: Orthopedics;  Laterality: Left;   THYROID SURGERY     goiter removed   TOTAL HIP REVISION Left 12/02/2017   Procedure: TOTAL HIP REVISION;  Surgeon: Lyndle Herrlich, MD;  Location: [facility];  Service: Orthopedics;  Laterality: Left;   TOTAL SHOULDER REPLACEMENT Right 2012    Medications Prior to Admission  Medication Sig Dispense Refill Last Dose   amiodarone (PACERONE) 200 MG tablet Take 200 mg by mouth daily.      colestipol (COLESTID) 1 g tablet Take 1 tablet by mouth 2 (two) times daily.      diltiazem (CARDIZEM CD) 360 MG 24 hr capsule Take 360 mg by mouth daily.      furosemide (LASIX) 20 MG tablet Take 20 mg by mouth daily.      glipiZIDE (GLUCOTROL XL) 2.5 MG 24 hr tablet Take 2.5 mg by mouth daily with breakfast.      metoprolol tartrate (LOPRESSOR) 25 MG tablet Take 1 tablet (25 mg total) by mouth 2 (two) times daily.      omeprazole (PRILOSEC) 20 MG capsule Take 20 mg by mouth every morning.      oxyCODONE-acetaminophen (PERCOCET/ROXICET) 5-325 MG tablet Take 1 tablet by mouth every 4 (four) hours as needed for moderate pain. 30 tablet 0    traZODone (DESYREL) 100 MG tablet Take 100 mg by mouth at bedtime.      TRELEGY ELLIPTA 100-62.5-25 MCG/ACT AEPB Inhale 1 puff into the lungs daily.      albuterol (VENTOLIN HFA) 108 (90 Base) MCG/ACT inhaler Inhale 2 puffs into the lungs every 6 (six) hours as needed.      apixaban (ELIQUIS) 2.5 MG TABS tablet Take 1 tablet (2.5 mg total) by mouth 2 (two) times daily. 60 tablet  1    ascorbic acid (VITAMIN C) 250 MG CHEW Chew 250 mg by mouth daily.      bisacodyl (DULCOLAX) 10 MG suppository Place 1 suppository (10 mg total) rectally daily as needed for moderate constipation.      Cholecalciferol (VITAMIN D-1000 MAX ST) 25 MCG (1000 UT) tablet Take 1,000 Units by mouth daily.      Cyanocobalamin (B-12) 2500 MCG TABS Take 2,500 mcg by mouth daily.      docusate sodium (COLACE) 100 MG capsule Take 1 capsule (100 mg total) by mouth 2 (two) times daily.      feeding supplement (ENSURE ENLIVE / ENSURE PLUS) LIQD Take 237 mLs by mouth 2 (two) times daily  between meals.      ipratropium-albuterol (DUONEB) 0.5-2.5 (3) MG/3ML SOLN Inhale 3 mLs into the lungs every 6 (six) hours as needed.      Magnesium Oxide 500 MG CAPS Take 500 mg by mouth daily.      methocarbamol (ROBAXIN) 500 MG tablet Take 1 tablet (500 mg total) by mouth every 8 (eight) hours as needed for muscle spasms.      Multiple Vitamin (MULTIVITAMIN WITH MINERALS) TABS tablet Take 1 tablet by mouth daily. One-A-Day Women's Vitamin      polyethylene glycol powder (MIRALAX) 17 GM/SCOOP powder 1 capful twice a day in 8 oz of juice or water until you have a bowel movement and then once a day every day 255 g 1    Social History   Socioeconomic History   Marital status: Divorced    Spouse name: Not on file   Number of children: Not on file   Years of education: Not on file   Highest education level: Not on file  Occupational History   Not on file  Tobacco Use   Smoking status: Never   Smokeless tobacco: Never  Vaping Use   Vaping status: Never Used  Substance and Sexual Activity   Alcohol use: No    Alcohol/week: 0.0 standard drinks of alcohol   Drug use: No   Sexual activity: Never  Other Topics Concern   Not on file  Social History Narrative   Not on file   Social Determinants of Health   Financial Resource Strain: Not on file  Food Insecurity: No Food Insecurity (05/01/2023)   Hunger Vital Sign     Worried About Running Out of Food in the Last Year: Never true    Ran Out of Food in the Last Year: Never true  Transportation Needs: No Transportation Needs (05/01/2023)   PRAPARE - Administrator, Civil Service (Medical): No    Lack of Transportation (Non-Medical): No  Physical Activity: Not on file  Stress: Not on file  Social Connections: Not on file  Intimate Partner Violence: Not At Risk (05/01/2023)   Humiliation, Afraid, Rape, and Kick questionnaire    Fear of Current or Ex-Partner: No    Emotionally Abused: No    Physically Abused: No    Sexually Abused: No    Family History  Problem Relation Age of Onset   Heart failure Mother    Hypertension Mother    Emphysema Father      Vitals:   05/01/23 0415 05/01/23 0500 05/01/23 0528 05/01/23 0759  BP: (!) 141/58 (!) 139/58 (!) 151/104 (!) 162/72  Pulse: (!) 59 60 63 65  Resp: 20 (!) 26 20 16   Temp:   98.2 F (36.8 C) 97.8 F (36.6 C)  TempSrc:    Oral  SpO2: 97% 98% 95% 100%  Weight:      Height:        PHYSICAL EXAM General: Well appearing, well nourished, in no acute distress. HEENT: Normocephalic and atraumatic. Neck: No JVD.  Lungs: Normal respiratory effort on room air. Clear bilaterally to auscultation. No wheezes, crackles, rhonchi.  Heart: HRRR. Normal S1 and S2 without gallops or murmurs.  Abdomen: Non-distended appearing.  Msk: Normal strength and tone for age. Extremities: Warm and well perfused. No clubbing, cyanosis. 1+ pitting edema bilaterally.  Neuro: Alert and oriented X 3. Psych: Answers questions appropriately.   Labs: Basic Metabolic Panel: Recent Labs    04/30/23 1050 04/30/23 1935 05/01/23 0245  NA 137  --  136  K 3.1*  --  3.1*  CL 100  --  101  CO2 24  --  24  GLUCOSE 114*  --  111*  BUN 27*  --  26*  CREATININE 1.50*  --  1.57*  CALCIUM 8.7*  --  7.9*  MG  --  1.2*  --    Liver Function Tests: Recent Labs    04/30/23 1050  AST 38  ALT 29  ALKPHOS 143*   BILITOT 1.1  PROT 7.1  ALBUMIN 3.7   Recent Labs    04/30/23 1050  LIPASE 20   CBC: Recent Labs    04/30/23 1050 05/01/23 0245  WBC 5.9 5.6  HGB 13.6 10.9*  HCT 39.6 33.0*  MCV 87.6 91.7  PLT 247 190   Cardiac Enzymes: Recent Labs    04/30/23 1050 04/30/23 1737 04/30/23 1935  TROPONINIHS 51* 68* 74*   BNP: Recent Labs    04/30/23 1050  BNP 748.3*   D-Dimer: No results for input(s): "DDIMER" in the last 72 hours. Hemoglobin A1C: No results for input(s): "HGBA1C" in the last 72 hours. Fasting Lipid Panel: No results for input(s): "CHOL", "HDL", "LDLCALC", "TRIG", "CHOLHDL", "LDLDIRECT" in the last 72 hours. Thyroid Function Tests: No results for input(s): "TSH", "T4TOTAL", "T3FREE", "THYROIDAB" in the last 72 hours.  Invalid input(s): "FREET3" Anemia Panel: No results for input(s): "VITAMINB12", "FOLATE", "FERRITIN", "TIBC", "IRON", "RETICCTPCT" in the last 72 hours.   Radiology: CT CHEST WO CONTRAST  Result Date: 04/30/2023 CLINICAL DATA:  Shortness of breath.  Pneumonia EXAM: CT CHEST WITHOUT CONTRAST TECHNIQUE: Multidetector CT imaging of the chest was performed following the standard protocol without IV contrast. RADIATION DOSE REDUCTION: This exam was performed according to the departmental dose-optimization program which includes automated exposure control, adjustment of the mA and/or kV according to patient size and/or use of iterative reconstruction technique. COMPARISON:  X-ray earlier 04/30/2023. Old CT scan 03/21/2023. Other exams as well. FINDINGS: Cardiovascular: Heart is nonenlarged. No significant pericardial effusion. Battery pack along the left upper chest for pacemaker with leads extending along the right side of the heart. Coronary artery calcifications are seen. The thoracic aorta has a normal course and caliber with scattered calcified atherosclerotic plaque. Mediastinum/Nodes: Moderate hiatal hernia. Normal caliber thoracic esophagus which is  slightly patulous. Heterogeneous thyroid gland with multiple cysts and calcifications, unchanged from previous examination. No specific abnormal lymph node enlargement identified in the axillary regions, hilum or mediastinum. Only a few small less than 1 cm in size in short axis mediastinal nodes are seen, nonpathologic by size criteria. Lungs/Pleura: Previous pleural effusions are no longer seen by CT. There is some lower lobe areas of bronchial wall thickening with subtle areas of luminal opacity, left-greater-than-right. Associated bandlike areas of opacity along both lung bases. Atelectasis is favored. There is also some bandlike changes along the posteroinferior right upper lobe. No frank consolidation, pneumothorax. Slight areas of patchy ground-glass along the posteroinferior right upper lobe such as axial series 3, image 62. the previous areas of ground-glass opacity with nodularity in the superior segment left lower lobe have improved and may have been infectious or inflammatory. Upper Abdomen: Dilated biliary tree, unchanged from previous. Nodular contours of the liver. Cystic lesion in the spleen. Numerous cystic foci along the kidneys several which are complex and hyperdense. Is also a cystic area posterior to the stomach on image 140 of series 2. Again these changes are stable from the prior more recent chest CT. Please correlate for any known  prior workup or dedicated evaluation when appropriate there is remote MRI as well from May 2022 Musculoskeletal: Streak artifact related to the patient's right shoulder arthroplasty. Moderate degenerative changes of the spine. Slight compression of the inferior endplate of T12, unchanged from previous. IMPRESSION: Changing lung opacities from the prior CT scan of August 2024. Improved areas of nodular opacity in the superior segment left lower lobe with improved pleural effusions. There is persistent lower lung bilateral bronchial wall thickening and some  bronchial debris and mucous plugging. New subtle patchy ground-glass along the posteroinferior right upper lobe. Recommend continued follow-up. Hiatal hernia. Numerous changes in the abdomen including biliary ductal dilatation, splenic, renal and retroperitoneal cystic lesions. Please correlate with previous workup or additional evaluation when appropriate. Pacemaker Aortic Atherosclerosis (ICD10-I70.0) and Emphysema (ICD10-J43.9). Electronically Signed   By: Karen Kays M.D.   On: 04/30/2023 17:58   CT HIP LEFT WO CONTRAST  Result Date: 04/30/2023 CLINICAL DATA:  Left hip pain with cyst for 1 week. Replacement 2 years ago. Recent green drainage, infection suspected. EXAM: CT OF THE LEFT HIP WITHOUT CONTRAST TECHNIQUE: Multidetector CT imaging of the left hip was performed according to the standard protocol. Multiplanar CT image reconstructions were also generated. RADIATION DOSE REDUCTION: This exam was performed according to the departmental dose-optimization program which includes automated exposure control, adjustment of the mA and/or kV according to patient size and/or use of iterative reconstruction technique. COMPARISON:  Radiographs 04/30/2023, 02/13/2023 and 01/26/2017. FINDINGS: Despite efforts by the technologist and patient, mild motion artifact is present on today's exam and could not be eliminated. This reduces exam sensitivity and specificity. Bones/Joint/Cartilage Status post left total hip arthroplasty. There is a screw fixed acetabular component which appears intact without loosening. Evaluation of the proximal femur is limited by motion and beam hardening artifact from the femoral prosthesis. There is an intact proximal cerclage wire. No evidence of acute fracture or dislocation. Chronic nonunion of the greater trochanter. No definite bone destruction is seen, although is difficult to exclude endosteal thinning along the posterolateral aspect of the femoral prosthesis given the motion. No  evidence of significant joint effusion. Ligaments Suboptimally assessed by CT. Muscles and Tendons Generalized muscular atrophy. No focal intramuscular fluid collection identified. Soft tissues There is stranding in the subcutaneous fat posterior and lateral to the left hip. No focal fluid collection, unexpected foreign body or soft tissue emphysema identified. Iliofemoral atherosclerosis noted. The visualized internal pelvic contents are notable for prominent sigmoid diverticulosis without evidence of acute inflammation. IMPRESSION: 1. Limited evaluation of the proximal left femur due to motion and beam hardening artifact from the femoral prosthesis. No definite bone destruction is seen, although endosteal thinning along the posterolateral aspect of the femoral prosthesis is difficult to exclude. Follow-up may be warranted. 2. No evidence of acute fracture or dislocation. 3. Nonspecific subcutaneous stranding posterior and lateral to the left hip without focal fluid collection, unexpected foreign body or soft tissue emphysema. 4. Generalized muscular atrophy. Electronically Signed   By: Carey Bullocks M.D.   On: 04/30/2023 17:26   DG HIP UNILAT WITH PELVIS 2-3 VIEWS LEFT  Result Date: 04/30/2023 CLINICAL DATA:  Left hip pain. EXAM: DG HIP (WITH OR WITHOUT PELVIS) 2-3V LEFT COMPARISON:  01/26/2017. FINDINGS: Evaluation is limited due to artifact from overlying bowel gas. No acute displaced fracture or dislocation. No aggressive osseous lesion. There are changes of chronic pubic symphisitis. Left hip arthroplasty noted. There is open reduction and internal fixation of intertrochanteric right proximal femur fracture with  trochanteric fixation nail and helical blade. No radiopaque foreign bodies. IMPRESSION: 1. No acute displaced fracture or dislocation. 2. Left hip arthroplasty. Electronically Signed   By: Jules Schick M.D.   On: 04/30/2023 15:21   DG Chest 2 View  Result Date: 04/30/2023 CLINICAL DATA:   Shortness of breath. EXAM: CHEST - 2 VIEW COMPARISON:  04/12/2023. FINDINGS: There are probable atelectatic changes at the left lung base. Bilateral lung fields are otherwise clear. No acute consolidation or lung collapse. Bilateral costophrenic angles are clear. Normal cardio-mediastinal silhouette. Aortic arch calcifications noted. No acute osseous abnormalities. Partially seen right reverse total shoulder arthroplasty.  T There is a left-sided dual lead cardiac pacemaker. The soft tissues are within normal limits. IMPRESSION: No active cardiopulmonary disease. Electronically Signed   By: Jules Schick M.D.   On: 04/30/2023 15:17   DG Chest 2 View  Result Date: 04/12/2023 CLINICAL DATA:  cough/ recent pneumonia EXAM: CHEST - 2 VIEW COMPARISON:  CXR 03/25/23 FINDINGS: Left-sided dual lead cardiac device in place with unchanged lead positioning. Right shoulder arthroplasty. No pleural effusion. No pneumothorax. Unchanged cardiac and mediastinal contours. Unchanged left infrahilar airspace opacity, which could represent atelectasis or infection. No radiographically apparent displaced rib fractures. Visualized upper abdomen is unremarkable. Vertebral body heights are maintained. IMPRESSION: Unchanged left infrahilar airspace opacity, which could represent atelectasis or infection. Electronically Signed   By: Lorenza Cambridge M.D.   On: 04/12/2023 14:31   DG Abdomen 1 View  Result Date: 04/12/2023 CLINICAL DATA:  Constipation, nausea, vomiting EXAM: ABDOMEN - 1 VIEW COMPARISON:  None Available. FINDINGS: There is a moderate stool burden throughout the colon. There is no evidence of mechanical obstruction. There is no definite free intraperitoneal air. A 1.5 cm metallic density object projecting over the right colon on the first images of uncertain etiology. Cholecystectomy clips are noted. There is S shaped scoliosis of the lumbar spine with multilevel degenerative changes. Postsurgical changes in both femurs are  noted without evidence of complication. IMPRESSION: Moderate colonic stool burden without evidence of obstruction. Electronically Signed   By: Lesia Hausen M.D.   On: 04/12/2023 14:30    ECHO 01/2023: 1. Left ventricular ejection fraction, by estimation, is 60 to 65%. The left ventricle has normal function. The left ventricle has no regional wall motion abnormalities. There is moderate concentric left ventricular hypertrophy. Left ventricular diastolic parameters are consistent with Grade I diastolic dysfunction (impaired relaxation).   2. Right ventricular systolic function is normal. The right ventricular size is normal.   3. The mitral valve is grossly normal. Mild mitral valve regurgitation.   4. The aortic valve is calcified. Aortic valve regurgitation is trivial. Aortic valve sclerosis/calcification is present, without any evidence of aortic stenosis.   TELEMETRY reviewed by me 05/01/2023: sinus rhythm rate 60s  EKG reviewed by me: sinus rhythm rate 76 bpm  Data reviewed by me 05/01/2023: last 24h vitals tele labs imaging I/O ED provider note, hospitalist progress note  Principal Problem:   Acute hypoxic respiratory failure (HCC) Active Problems:   Elevated troponin   Acute on chronic diastolic CHF (congestive heart failure) (HCC)   Paroxysmal atrial fibrillation (HCC)   Type II diabetes mellitus with renal manifestations (HCC)   COPD (chronic obstructive pulmonary disease) (HCC)   Hypokalemia    ASSESSMENT AND PLAN:  Leslie Parker is a 89 y.o. female  with a past medical history of chronic diastolic heart failure, sick sinus syndrome s/p [employer] DC PPM implant 11/15/2022, paroxysmal AF (  eliquis), DM2, COPD who presented to the ED on 04/30/2023 for hip pain. She also endorsed worsening SOB and LE edema. Cardiology was consulted for further evaluation.   # Acute on chronic HFpEF # Demand ischemia # Hypertension Patient presenting with worsening SOB and leg swelling.  Reportedly had not been taking some of her medications. BNP elevated at 748. Troponins trended 51 > 68 > 74. She is without chest pain or anginal symptoms.  -Continue IV lasix 40 mg daily. Plan to increase PO dose on discharge.  -Troponin elevation likely 2/2 demand supply mismatch in the setting of acute heart failure.   # SSS s/p DC PPM 10/2022 # Paroxysmal atrial fibrillation Device appears to be functioning well, no evidence of atrial fibrillation on telemetry. Patient without complaints of palpitations, heart racing, dizziness.  -Continue eliquis 2.5 mg twice daily for stroke risk reduction.  -Continue amiodarone 200 mg daily for rhythm control.  -Continue metoprolol 25 mg bid and diltiazem 360 mg daily for rate control.  # Chronic kidney disease stage IIIb # Hypokalemia Cr 1.5 on presentation, slightly increased on AM labs today at 1.57.  -Continue to monitor renal function closely with diuresis. -Monitor and replenish electrolytes for a goal K >4, Mag >2    This patient's plan of care was discussed and created with Dr. Juliann Pares and he is in agreement.  Signed: Gale Journey, PA-C  05/01/2023, 11:57 AM [facility]

## 2023-05-02 DIAGNOSIS — N179 Acute kidney failure, unspecified: Secondary | ICD-10-CM

## 2023-05-02 DIAGNOSIS — E1121 Type 2 diabetes mellitus with diabetic nephropathy: Secondary | ICD-10-CM

## 2023-05-02 DIAGNOSIS — I5033 Acute on chronic diastolic (congestive) heart failure: Secondary | ICD-10-CM | POA: Diagnosis not present

## 2023-05-02 DIAGNOSIS — J441 Chronic obstructive pulmonary disease with (acute) exacerbation: Secondary | ICD-10-CM | POA: Diagnosis not present

## 2023-05-02 DIAGNOSIS — N189 Chronic kidney disease, unspecified: Secondary | ICD-10-CM

## 2023-05-02 DIAGNOSIS — J9601 Acute respiratory failure with hypoxia: Secondary | ICD-10-CM | POA: Diagnosis not present

## 2023-05-02 LAB — BASIC METABOLIC PANEL
Anion gap: 11 (ref 5–15)
BUN: 37 mg/dL — ABNORMAL HIGH (ref 8–23)
CO2: 23 mmol/L (ref 22–32)
Calcium: 8 mg/dL — ABNORMAL LOW (ref 8.9–10.3)
Chloride: 98 mmol/L (ref 98–111)
Creatinine, Ser: 2.04 mg/dL — ABNORMAL HIGH (ref 0.44–1.00)
GFR, Estimated: 23 mL/min — ABNORMAL LOW (ref >60)
Glucose, Bld: 364 mg/dL — ABNORMAL HIGH (ref 70–99)
Potassium: 4.2 mmol/L (ref 3.5–5.1)
Sodium: 132 mmol/L — ABNORMAL LOW (ref 135–145)

## 2023-05-02 LAB — GLUCOSE, CAPILLARY
Glucose-Capillary: 297 mg/dL — ABNORMAL HIGH (ref 70–99)
Glucose-Capillary: 307 mg/dL — ABNORMAL HIGH (ref 70–99)
Glucose-Capillary: 321 mg/dL — ABNORMAL HIGH (ref 70–99)
Glucose-Capillary: 286 mg/dL — ABNORMAL HIGH (ref 70–99)

## 2023-05-02 LAB — MAGNESIUM: Magnesium: 1.9 mg/dL (ref 1.7–2.4)

## 2023-05-02 MED ORDER — AMOXICILLIN-POT CLAVULANATE 500-125 MG PO TABS
1.0000 | ORAL_TABLET | Freq: Two times a day (BID) | ORAL | Status: DC
  Administered 2023-05-02 – 2023-05-07 (×10): 1 via ORAL
  Filled 2023-05-02 (×11): qty 1

## 2023-05-02 MED ORDER — INSULIN ASPART 100 UNIT/ML IJ SOLN
0.0000 [IU] | Freq: Three times a day (TID) | INTRAMUSCULAR | Status: DC
  Administered 2023-05-02: 11 [IU] via SUBCUTANEOUS
  Administered 2023-05-03: 3 [IU] via SUBCUTANEOUS
  Administered 2023-05-03: 8 [IU] via SUBCUTANEOUS
  Administered 2023-05-03: 3 [IU] via SUBCUTANEOUS
  Administered 2023-05-04 – 2023-05-05 (×5): 2 [IU] via SUBCUTANEOUS
  Administered 2023-05-06: 5 [IU] via SUBCUTANEOUS
  Administered 2023-05-06 (×2): 3 [IU] via SUBCUTANEOUS
  Administered 2023-05-07 (×2): 2 [IU] via SUBCUTANEOUS
  Administered 2023-05-07: 5 [IU] via SUBCUTANEOUS
  Administered 2023-05-08 (×2): 3 [IU] via SUBCUTANEOUS
  Administered 2023-05-08 – 2023-05-09 (×2): 2 [IU] via SUBCUTANEOUS
  Filled 2023-05-02 (×19): qty 1

## 2023-05-02 MED ORDER — PREDNISONE 20 MG PO TABS: 30.0000 mg | ORAL_TABLET | Freq: Every day | ORAL | Status: DC

## 2023-05-02 MED ORDER — INSULIN ASPART 100 UNIT/ML IJ SOLN
0.0000 [IU] | Freq: Every day | INTRAMUSCULAR | Status: DC
  Administered 2023-05-02: 3 [IU] via SUBCUTANEOUS
  Administered 2023-05-03 – 2023-05-04 (×2): 2 [IU] via SUBCUTANEOUS
  Filled 2023-05-02 (×3): qty 1

## 2023-05-02 MED ORDER — INSULIN ASPART 100 UNIT/ML IJ SOLN
0.0000 [IU] | Freq: Three times a day (TID) | INTRAMUSCULAR | Status: DC
  Administered 2023-05-02: 4 [IU] via SUBCUTANEOUS
  Filled 2023-05-02: qty 1

## 2023-05-02 NOTE — Progress Notes (Signed)
Patient ID: Leslie Parker, female   DOB: May 29, 1933, 89 y.o.   MRN: [id number]  Subjective: The patient feels that her left hip symptoms have improved over the past day or so.  She is ambulating more easily in the hallways with physical therapy.  She denies any reinjury to the hip, and denies any new complaints pertaining to her hip.  Objective: Vital signs in last 24 hours: Temp:  [97.5 F (36.4 C)-98.5 F (36.9 C)] 98.3 F (36.8 C) (10/10 1149) Pulse Rate:  [59-62] 62 (10/10 1423) Resp:  [16-20] 16 (10/10 1423) BP: (140-171)/(67-81) 171/73 (10/10 1149) SpO2:  [93 %-97 %] 97 % (10/10 1423) Weight:  [81.4 kg] 81.4 kg (10/10 0618)  Intake/Output from previous day: 10/09 0701 - 10/10 0700 In: 100 [IV Piggyback:100] Out: 300 [Urine:300] Intake/Output this shift: Total I/O In: 240 [P.O.:240] Out: -   Recent Labs    04/30/23 1050 05/01/23 0245  HGB 13.6 10.9*   Recent Labs    04/30/23 1050 05/01/23 0245  WBC 5.9 5.6  RBC 4.52 3.60*  HCT 39.6 33.0*  PLT 247 190   Recent Labs    05/01/23 0245 05/02/23 0512  NA 136 132*  K 3.1* 4.2  CL 101 98  CO2 24 23  BUN 26* 37*  CREATININE 1.57* 2.04*  GLUCOSE 111* 364*  CALCIUM 7.9* 8.0*   No results for input(s): "LABPT", "INR" in the last 72 hours.  Physical Exam: Orthopedic examination again is limited to the left hip and lower extremity.  She still notes some tenderness to palpation she has pain-free motion of her hip and is grossly neurovascularly intact to the left lower extremity and foot.  Over the lateral aspect of her left hip, but does feel that it is improved  Assessment: Lateral sided left hip pain status post left total hip arthroplasty of unclear etiology, improving symptomatically.  Plan: Treatment options have been reviewed with the patient.  Overall, the patient is pleased with her symptomatic and functional improvement at this time.  The patient is encouraged to continue with physical therapy and her home  exercises in order to optimize her range of motion, strength, and overall function.   Thank you for asked me to participate in the care of this most pleasant woman.  I will sign off at this time.  If you have further need of orthopedic input during this hospitalization, please reconsult me.  She may follow-up with Dr. Odis Luster at [facility] as she has continued to work with the [facility] team for hip and shoulder issues over the past few years.   Excell Seltzer Mikaili Flippin 05/02/2023, 4:27 PM

## 2023-05-02 NOTE — Progress Notes (Signed)
Physical Therapy Treatment Patient Details Name: Leslie Parker MRN: [id number] DOB: 06/07/1933 Today's Date: 05/02/2023   History of Present Illness 89 y/o female presented to ED on 04/30/23 for L hip cyst and pain x 1 week as well as SOB and LE edema. Admitted for acute hypoxic respiratory failure and acute exacerbation of CHF. PMH: sick sinus syndrome s/p pacemaker placement 4/25, HTN, Afib, DM, CKD stage 3, COPD    PT Comments  Patient received in bed, she is agreeable to PT session. Reports she did not sleep much at all last night. Patient is mod I with bed mobility and transfers with supervision. Patient is able to increase ambulation to ~90 feet with RW with supervision. Demonstrates good awareness/use of RW and posture. Patient will continue to benefit from skilled PT to improve functional independence, strength and endurance.       If plan is discharge home, recommend the following: A little help with walking and/or transfers;A little help with bathing/dressing/bathroom   Can travel by private vehicle      yes  Equipment Recommendations  Rolling walker (2 wheels)    Recommendations for Other Services       Precautions / Restrictions Precautions Precautions: Fall Restrictions Weight Bearing Restrictions: No     Mobility  Bed Mobility Overal bed mobility: Modified Independent Bed Mobility: Supine to Sit     Supine to sit: Modified independent (Device/Increase time)          Transfers Overall transfer level: Needs assistance Equipment used: Rolling walker (2 wheels) Transfers: Sit to/from Stand Sit to Stand: Supervision           General transfer comment: supervision for safety    Ambulation/Gait Ambulation/Gait assistance: Supervision Gait Distance (Feet): 90 Feet Assistive device: Rolling walker (2 wheels)   Gait velocity: decreased     General Gait Details: supervision for safety. complaining of L hip soreness   Stairs              Wheelchair Mobility     Tilt Bed    Modified Rankin (Stroke Patients Only)       Balance Overall balance assessment: Needs assistance Sitting-balance support: Feet supported Sitting balance-[name]: Normal     Standing balance support: Bilateral upper extremity supported, During functional activity, Reliant on assistive device for balance Standing balance-[name]: Good Standing balance comment: no LOB                            Cognition Arousal: Alert Behavior During Therapy: WFL for tasks assessed/performed Overall Cognitive Status: Within Functional Limits for tasks assessed                                          Exercises      General Comments        Pertinent Vitals/Pain Pain Assessment Pain Assessment: Faces Faces Pain Scale: Hurts a little bit Pain Location: L hip Pain Descriptors / Indicators: Discomfort Pain Intervention(s): Repositioned, Monitored during session    Home Living                          Prior Function            PT Goals (current goals can now be found in the care plan section) Acute Rehab PT Goals Patient Stated  Goal: to go home PT Goal Formulation: With patient Time For Goal Achievement: 05/15/23 Potential to Achieve Goals: Good Progress towards PT goals: Progressing toward goals    Frequency    Min 1X/week      PT Plan      Co-evaluation              AM-PAC PT "6 Clicks" Mobility   Outcome Measure  Help needed turning from your back to your side while in a flat bed without using bedrails?: None Help needed moving from lying on your back to sitting on the side of a flat bed without using bedrails?: A Little Help needed moving to and from a bed to a chair (including a wheelchair)?: A Little Help needed standing up from a chair using your arms (e.g., wheelchair or bedside chair)?: A Little Help needed to walk in hospital room?: A Little Help needed  climbing 3-5 steps with a railing? : A Lot 6 Click Score: 18    End of Session   Activity Tolerance: Patient tolerated treatment well Patient left: in chair;with call bell/phone within reach Nurse Communication: Mobility status PT Visit Diagnosis: Muscle weakness (generalized) (M62.81);Difficulty in walking, not elsewhere classified (R26.2);Pain Pain - Right/Left: Left Pain - part of body: Hip     Time: 0927-0940 PT Time Calculation (min) (ACUTE ONLY): 13 min  Charges:    $Gait Training: 8-22 mins PT General Charges $$ ACUTE PT VISIT: 1 Visit                     Akon Reinoso, PT, GCS 05/02/23,10:01 AM

## 2023-05-02 NOTE — Progress Notes (Signed)
Progress Note   Patient: Leslie Parker:[id number] DOB: 1932/11/24 DOA: 04/30/2023     2 DOS: the patient was seen and examined on 05/02/2023   Brief hospital course: 89 year old female past medical history of chronic diastolic dysfunction, pacemaker, history of atrial fibrillation on chronic Eliquis, type 2 diabetes mellitus with stage III chronic kidney disease, COPD, recent left hip arthroplasty.  She has had worsening hip pain over the last week found to have an elevated blood pressure came in with shortness of breath and found to be hypoxic.  10/9.  Started on Solu-Medrol secondary to diffuse wheezing and COPD exacerbation.  Replacing IV magnesium. 10/10.  Creatinine up to 2.04.  Holding Lasix.  Continue Solu-Medrol today.  Assessment and Plan: * COPD with acute exacerbation (HCC) Continue Solu-Medrol.  Continue albuterol nebulizers.  Patient takes Trelegy at home.  Continue Trelegy substitute here.  Acute respiratory failure with hypoxia (HCC) Patient was noted to be tachypneic and had room air pulse oximetry of 88% This morning was off oxygen.   Acute kidney injury superimposed on CKD (HCC) AKI on CKD stage IIIb.  Creatinine up to 2.04 with overdiuresis.  Hold Lasix.  Baseline creatinine around 1.57  Acute on chronic diastolic CHF (congestive heart failure) (HCC) Lasix held with overdiuresis.  Last echocardiogram shows an EF of 60%  Paroxysmal atrial fibrillation (HCC) Continue amiodarone, metoprolol and Cardizem for rate control Continue Eliquis as primary prophylaxis for an acute stroke  Type II diabetes mellitus with renal manifestations (HCC) Patient with diabetes mellitus with complications of stage IIIb chronic kidney disease Hold glipizide Monitor renal function closely while on diuretic therapy Maintain consistent carbohydrate diet Last hemoglobin A1c is 5.8 Sugars higher being on steroids.  Increase sliding scale.   Left hip pain Seen by orthopedic and  recommended PT  Overweight (BMI 25.0-29.9) BMI 26.50  Myocardial injury Likely secondary to acute respiratory failure, CHF and COPD exacerbation.  Hypokalemia Replaced  Hypomagnesemia Replaced        Subjective: Patient feels better with regards to her breathing.  Still having some coughing.  Still feels a little short of breath.  Creatinine up to 2.04 with diuresis.  Physical Exam: Vitals:   05/02/23 0816 05/02/23 0955 05/02/23 1149 05/02/23 1423  BP: (!) 140/81  (!) 171/73   Pulse: (!) 59  60 62  Resp: 18  19 16   Temp: 98.4 F (36.9 C)  98.3 F (36.8 C)   TempSrc: Oral     SpO2: 94% 95% 97% 97%  Weight:      Height:       Physical Exam HENT:     Head: Normocephalic.     Mouth/Throat:     Pharynx: No oropharyngeal exudate.  Eyes:     General: Lids are normal.     Conjunctiva/sclera: Conjunctivae normal.  Cardiovascular:     Rate and Rhythm: Normal rate and regular rhythm.     Heart sounds: S1 normal and S2 normal. Murmur heard.     Systolic murmur is present with a grade of 2/6.  Pulmonary:     Breath sounds: Examination of the right-lower field reveals decreased breath sounds and wheezing. Examination of the left-lower field reveals decreased breath sounds and wheezing. Decreased breath sounds and wheezing present. No rhonchi or rales.  Abdominal:     Palpations: Abdomen is soft.     Tenderness: There is no abdominal tenderness.  Musculoskeletal:     Right lower leg: Swelling present.     Left lower  leg: Swelling present.  Skin:    General: Skin is warm.     Findings: No rash.  Neurological:     Mental Status: She is alert and oriented to person, place, and time.     Data Reviewed: Creatinine 2.04, sodium 132  Family Communication: Left message for granddaughter  Disposition: Status is: Inpatient Remains inpatient appropriate because: watch today with overdiuresis  Planned Discharge Destination: Home    Time spent: 28  minutes  Author: Alford Highland, MD 05/02/2023 3:56 PM  For on call review [email or web address].

## 2023-05-02 NOTE — Progress Notes (Signed)
[facility] ([facility]) CLINIC CARDIOLOGY CONSULT NOTE       Patient ID: Leslie Parker MRN: [id number] DOB/AGE: 02/17/1933 89 y.o.  Admit date: 04/30/2023 Referring Physician Dr. Joylene Igo Primary Physician Dr. Nemiah Commander Primary Cardiologist Dr. Juliann Pares Reason for Consultation AoCHFpEF  HPI: Leslie Parker is a 89 y.o. female  with a past medical history of chronic diastolic heart failure, sick sinus syndrome s/p [employer] DC PPM implant 11/15/2022, paroxysmal AF (eliquis), DM2, COPD who presented to the ED on 04/30/2023 for hip pain. She also endorsed worsening SOB and LE edema. Cardiology was consulted for further evaluation.   Interval history: -Patient reports SOB and LE edema are much improved.  -Continue to report hip pain. Has been seen by ortho who recommended PT.  -Cr up this AM, will defer additional diuresis.   Review of systems complete and found to be negative unless listed above    Past Medical History:  Diagnosis Date   Asthma    Atrial fibrillation with RVR (HCC)    Cancer (HCC)    Basal Cell   Diabetes mellitus without complication (HCC)    Heart murmur    Hypertension    Impetigo    Osteoarthritis    Osteopenia    Vomiting    can not due to surgery   Wears dentures    full upper and lower    Past Surgical History:  Procedure Laterality Date   ABDOMINAL HYSTERECTOMY     BACK SURGERY     BLADDER SURGERY     mesh   CARPAL TUNNEL RELEASE Bilateral    CATARACT EXTRACTION Right 2017   CATARACT EXTRACTION W/PHACO Left 02/06/2016   Procedure: CATARACT EXTRACTION PHACO AND INTRAOCULAR LENS PLACEMENT (IOC) left eye;  Surgeon: Sherald Hess, MD;  Location: [facility];  Service: Ophthalmology;  Laterality: Left;  DIABETIC LEFT Cannot arrive before 9:30   CERVICAL DISC SURGERY     CHOLECYSTECTOMY     EYE SURGERY Bilateral    Cataract Extraction with IOL   FEMUR IM NAIL Left 02/04/2015   Procedure: INTRAMEDULLARY (IM) NAIL FEMORAL;  Surgeon: Juanell Fairly, MD;  Location: [facility] ORS;  Service: Orthopedics;  Laterality: Left;   HARDWARE REMOVAL Left 01/17/2017   Procedure: HARDWARE REMOVAL;  Surgeon: Juanell Fairly, MD;  Location: [facility] ORS;  Service: Orthopedics;  Laterality: Left;   HERNIA REPAIR  2014   esophageal and gastric mesh. patient unable to throw up d/t mesh   HIP ARTHROPLASTY Left 01/26/2017   Procedure: ARTHROPLASTY BIPOLAR HIP (HEMIARTHROPLASTY) removal hardware left hip;  Surgeon: Deeann Saint, MD;  Location: [facility] OR[facility];  Service: Orthopedics;  Laterality: Left;   INTRAMEDULLARY (IM) NAIL INTERTROCHANTERIC Right 02/13/2023   Procedure: INTRAMEDULLARY (IM) NAIL INTERTROCHANTERIC;  Surgeon: Christena Flake, MD;  Location: [facility] OR[facility];  Service: Orthopedics;  Laterality: Right;   JOINT REPLACEMENT Bilateral    knees   PACEMAKER IMPLANT N/A 11/15/2022   Procedure: PACEMAKER IMPLANT;  Surgeon: Marcina Millard, MD;  Location: [facility] INV[facility];  Service: Cardiovascular;  Laterality: N/A;   PACEMAKER IMPLANT N/A 11/28/2022   Procedure: PACEMAKER IMPLANT;  Surgeon: Marcina Millard, MD;  Location: [facility];  Service: Cardiovascular;  Laterality: N/A;  Lead reposition   REPLACEMENT TOTAL KNEE BILATERAL Bilateral [id number]   SHOULDER ARTHROSCOPY WITH OPEN ROTATOR CUFF REPAIR AND DISTAL CLAVICLE ACROMINECTOMY Left 10/25/2016   Procedure: SHOULDER ARTHROSCOPY WITH OPEN ROTATOR CUFF REPAIR AND DISTAL CLAVICLE ACROMINECTOMY;  Surgeon: Juanell Fairly, MD;  Location: [facility];  Service: Orthopedics;  Laterality: Left;   THYROID SURGERY     goiter removed   TOTAL HIP REVISION Left 12/02/2017   Procedure: TOTAL HIP REVISION;  Surgeon: Lyndle Herrlich, MD;  Location: [facility] OR[facility];  Service: Orthopedics;  Laterality: Left;   TOTAL SHOULDER REPLACEMENT Right 2012    Medications Prior to Admission  Medication Sig Dispense Refill Last Dose   amiodarone (PACERONE) 200 MG tablet Take 200 mg by mouth daily.      colestipol (COLESTID) 1 g  tablet Take 1 tablet by mouth 2 (two) times daily.      diltiazem (CARDIZEM CD) 360 MG 24 hr capsule Take 360 mg by mouth daily.      furosemide (LASIX) 20 MG tablet Take 20 mg by mouth daily.      glipiZIDE (GLUCOTROL XL) 2.5 MG 24 hr tablet Take 2.5 mg by mouth daily with breakfast.      metoprolol tartrate (LOPRESSOR) 25 MG tablet Take 1 tablet (25 mg total) by mouth 2 (two) times daily.      omeprazole (PRILOSEC) 20 MG capsule Take 20 mg by mouth every morning.      oxyCODONE-acetaminophen (PERCOCET/ROXICET) 5-325 MG tablet Take 1 tablet by mouth every 4 (four) hours as needed for moderate pain. 30 tablet 0    traZODone (DESYREL) 100 MG tablet Take 100 mg by mouth at bedtime.      TRELEGY ELLIPTA 100-62.5-25 MCG/ACT AEPB Inhale 1 puff into the lungs daily.      albuterol (VENTOLIN HFA) 108 (90 Base) MCG/ACT inhaler Inhale 2 puffs into the lungs every 6 (six) hours as needed.      apixaban (ELIQUIS) 2.5 MG TABS tablet Take 1 tablet (2.5 mg total) by mouth 2 (two) times daily. 60 tablet 1    ascorbic acid (VITAMIN C) 250 MG CHEW Chew 250 mg by mouth daily.      bisacodyl (DULCOLAX) 10 MG suppository Place 1 suppository (10 mg total) rectally daily as needed for moderate constipation.      Cholecalciferol (VITAMIN D-1000 MAX ST) 25 MCG (1000 UT) tablet Take 1,000 Units by mouth daily.      Cyanocobalamin (B-12) 2500 MCG TABS Take 2,500 mcg by mouth daily.      docusate sodium (COLACE) 100 MG capsule Take 1 capsule (100 mg total) by mouth 2 (two) times daily.      feeding supplement (ENSURE ENLIVE / ENSURE PLUS) LIQD Take 237 mLs by mouth 2 (two) times daily between meals.      ipratropium-albuterol (DUONEB) 0.5-2.5 (3) MG/3ML SOLN Inhale 3 mLs into the lungs every 6 (six) hours as needed.      Magnesium Oxide 500 MG CAPS Take 500 mg by mouth daily.      methocarbamol (ROBAXIN) 500 MG tablet Take 1 tablet (500 mg total) by mouth every 8 (eight) hours as needed for muscle spasms.      Multiple  Vitamin (MULTIVITAMIN WITH MINERALS) TABS tablet Take 1 tablet by mouth daily. One-A-Day Women's Vitamin      polyethylene glycol powder (MIRALAX) 17 GM/SCOOP powder 1 capful twice a day in 8 oz of juice or water until you have a bowel movement and then once a day every day 255 g 1    Social History   Socioeconomic History   Marital status: Divorced    Spouse name: Not on file   Number of children: Not on file   Years of education: Not on file   Highest education level: Not on file  Occupational  History   Not on file  Tobacco Use   Smoking status: Never   Smokeless tobacco: Never  Vaping Use   Vaping status: Never Used  Substance and Sexual Activity   Alcohol use: No    Alcohol/week: 0.0 standard drinks of alcohol   Drug use: No   Sexual activity: Never  Other Topics Concern   Not on file  Social History Narrative   Not on file   Social Determinants of Health   Financial Resource Strain: Not on file  Food Insecurity: No Food Insecurity (05/01/2023)   Hunger Vital Sign    Worried About Running Out of Food in the Last Year: Never true    Ran Out of Food in the Last Year: Never true  Transportation Needs: No Transportation Needs (05/01/2023)   PRAPARE - Administrator, Civil Service (Medical): No    Lack of Transportation (Non-Medical): No  Physical Activity: Not on file  Stress: Not on file  Social Connections: Not on file  Intimate Partner Violence: Not At Risk (05/01/2023)   Humiliation, Afraid, Rape, and Kick questionnaire    Fear of Current or Ex-Partner: No    Emotionally Abused: No    Physically Abused: No    Sexually Abused: No    Family History  Problem Relation Age of Onset   Heart failure Mother    Hypertension Mother    Emphysema Father      Vitals:   05/02/23 0010 05/02/23 0514 05/02/23 0618 05/02/23 0749  BP: (!) 145/72 (!) 145/67    Pulse: 60 60  61  Resp: 18 18  18   Temp: (!) 97.5 F (36.4 C) 98.2 F (36.8 C)    TempSrc:      SpO2:  94% 96%  94%  Weight:   81.4 kg   Height:        PHYSICAL EXAM General: Well appearing, well nourished, in no acute distress sitting upright in hospital bed. HEENT: Normocephalic and atraumatic. Neck: No JVD.  Lungs: Normal respiratory effort on room air. Clear bilaterally to auscultation. No wheezes, crackles, rhonchi.  Heart: HRRR. Normal S1 and S2 without gallops or murmurs.  Abdomen: Non-distended appearing.  Msk: Normal strength and tone for age. Extremities: Warm and well perfused. No clubbing, cyanosis. Trace edema bilaterally.  Neuro: Alert and oriented X 3. Psych: Answers questions appropriately.   Labs: Basic Metabolic Panel: Recent Labs    04/30/23 1935 05/01/23 0245 05/02/23 0512  NA  --  136 132*  K  --  3.1* 4.2  CL  --  101 98  CO2  --  24 23  GLUCOSE  --  111* 364*  BUN  --  26* 37*  CREATININE  --  1.57* 2.04*  CALCIUM  --  7.9* 8.0*  MG 1.2*  --  1.9   Liver Function Tests: Recent Labs    04/30/23 1050  AST 38  ALT 29  ALKPHOS 143*  BILITOT 1.1  PROT 7.1  ALBUMIN 3.7   Recent Labs    04/30/23 1050  LIPASE 20   CBC: Recent Labs    04/30/23 1050 05/01/23 0245  WBC 5.9 5.6  HGB 13.6 10.9*  HCT 39.6 33.0*  MCV 87.6 91.7  PLT 247 190   Cardiac Enzymes: Recent Labs    04/30/23 1050 04/30/23 1737 04/30/23 1935  TROPONINIHS 51* 68* 74*   BNP: Recent Labs    04/30/23 1050  BNP 748.3*   D-Dimer: No results for input(s): "DDIMER"  in the last 72 hours. Hemoglobin A1C: No results for input(s): "HGBA1C" in the last 72 hours. Fasting Lipid Panel: No results for input(s): "CHOL", "HDL", "LDLCALC", "TRIG", "CHOLHDL", "LDLDIRECT" in the last 72 hours. Thyroid Function Tests: No results for input(s): "TSH", "T4TOTAL", "T3FREE", "THYROIDAB" in the last 72 hours.  Invalid input(s): "FREET3" Anemia Panel: No results for input(s): "VITAMINB12", "FOLATE", "FERRITIN", "TIBC", "IRON", "RETICCTPCT" in the last 72 hours.   Radiology: CT  CHEST WO CONTRAST  Result Date: 04/30/2023 CLINICAL DATA:  Shortness of breath.  Pneumonia EXAM: CT CHEST WITHOUT CONTRAST TECHNIQUE: Multidetector CT imaging of the chest was performed following the standard protocol without IV contrast. RADIATION DOSE REDUCTION: This exam was performed according to the departmental dose-optimization program which includes automated exposure control, adjustment of the mA and/or kV according to patient size and/or use of iterative reconstruction technique. COMPARISON:  X-ray earlier 04/30/2023. Old CT scan 03/21/2023. Other exams as well. FINDINGS: Cardiovascular: Heart is nonenlarged. No significant pericardial effusion. Battery pack along the left upper chest for pacemaker with leads extending along the right side of the heart. Coronary artery calcifications are seen. The thoracic aorta has a normal course and caliber with scattered calcified atherosclerotic plaque. Mediastinum/Nodes: Moderate hiatal hernia. Normal caliber thoracic esophagus which is slightly patulous. Heterogeneous thyroid gland with multiple cysts and calcifications, unchanged from previous examination. No specific abnormal lymph node enlargement identified in the axillary regions, hilum or mediastinum. Only a few small less than 1 cm in size in short axis mediastinal nodes are seen, nonpathologic by size criteria. Lungs/Pleura: Previous pleural effusions are no longer seen by CT. There is some lower lobe areas of bronchial wall thickening with subtle areas of luminal opacity, left-greater-than-right. Associated bandlike areas of opacity along both lung bases. Atelectasis is favored. There is also some bandlike changes along the posteroinferior right upper lobe. No frank consolidation, pneumothorax. Slight areas of patchy ground-glass along the posteroinferior right upper lobe such as axial series 3, image 62. the previous areas of ground-glass opacity with nodularity in the superior segment left lower lobe  have improved and may have been infectious or inflammatory. Upper Abdomen: Dilated biliary tree, unchanged from previous. Nodular contours of the liver. Cystic lesion in the spleen. Numerous cystic foci along the kidneys several which are complex and hyperdense. Is also a cystic area posterior to the stomach on image 140 of series 2. Again these changes are stable from the prior more recent chest CT. Please correlate for any known prior workup or dedicated evaluation when appropriate there is remote MRI as well from May 2022 Musculoskeletal: Streak artifact related to the patient's right shoulder arthroplasty. Moderate degenerative changes of the spine. Slight compression of the inferior endplate of T12, unchanged from previous. IMPRESSION: Changing lung opacities from the prior CT scan of August 2024. Improved areas of nodular opacity in the superior segment left lower lobe with improved pleural effusions. There is persistent lower lung bilateral bronchial wall thickening and some bronchial debris and mucous plugging. New subtle patchy ground-glass along the posteroinferior right upper lobe. Recommend continued follow-up. Hiatal hernia. Numerous changes in the abdomen including biliary ductal dilatation, splenic, renal and retroperitoneal cystic lesions. Please correlate with previous workup or additional evaluation when appropriate. Pacemaker Aortic Atherosclerosis (ICD10-I70.0) and Emphysema (ICD10-J43.9). Electronically Signed   By: Karen Kays M.D.   On: 04/30/2023 17:58   CT HIP LEFT WO CONTRAST  Result Date: 04/30/2023 CLINICAL DATA:  Left hip pain with cyst for 1 week. Replacement 2 years ago.  Recent green drainage, infection suspected. EXAM: CT OF THE LEFT HIP WITHOUT CONTRAST TECHNIQUE: Multidetector CT imaging of the left hip was performed according to the standard protocol. Multiplanar CT image reconstructions were also generated. RADIATION DOSE REDUCTION: This exam was performed according to the  departmental dose-optimization program which includes automated exposure control, adjustment of the mA and/or kV according to patient size and/or use of iterative reconstruction technique. COMPARISON:  Radiographs 04/30/2023, 02/13/2023 and 01/26/2017. FINDINGS: Despite efforts by the technologist and patient, mild motion artifact is present on today's exam and could not be eliminated. This reduces exam sensitivity and specificity. Bones/Joint/Cartilage Status post left total hip arthroplasty. There is a screw fixed acetabular component which appears intact without loosening. Evaluation of the proximal femur is limited by motion and beam hardening artifact from the femoral prosthesis. There is an intact proximal cerclage wire. No evidence of acute fracture or dislocation. Chronic nonunion of the greater trochanter. No definite bone destruction is seen, although is difficult to exclude endosteal thinning along the posterolateral aspect of the femoral prosthesis given the motion. No evidence of significant joint effusion. Ligaments Suboptimally assessed by CT. Muscles and Tendons Generalized muscular atrophy. No focal intramuscular fluid collection identified. Soft tissues There is stranding in the subcutaneous fat posterior and lateral to the left hip. No focal fluid collection, unexpected foreign body or soft tissue emphysema identified. Iliofemoral atherosclerosis noted. The visualized internal pelvic contents are notable for prominent sigmoid diverticulosis without evidence of acute inflammation. IMPRESSION: 1. Limited evaluation of the proximal left femur due to motion and beam hardening artifact from the femoral prosthesis. No definite bone destruction is seen, although endosteal thinning along the posterolateral aspect of the femoral prosthesis is difficult to exclude. Follow-up may be warranted. 2. No evidence of acute fracture or dislocation. 3. Nonspecific subcutaneous stranding posterior and lateral to the  left hip without focal fluid collection, unexpected foreign body or soft tissue emphysema. 4. Generalized muscular atrophy. Electronically Signed   By: Carey Bullocks M.D.   On: 04/30/2023 17:26   DG HIP UNILAT WITH PELVIS 2-3 VIEWS LEFT  Result Date: 04/30/2023 CLINICAL DATA:  Left hip pain. EXAM: DG HIP (WITH OR WITHOUT PELVIS) 2-3V LEFT COMPARISON:  01/26/2017. FINDINGS: Evaluation is limited due to artifact from overlying bowel gas. No acute displaced fracture or dislocation. No aggressive osseous lesion. There are changes of chronic pubic symphisitis. Left hip arthroplasty noted. There is open reduction and internal fixation of intertrochanteric right proximal femur fracture with trochanteric fixation nail and helical blade. No radiopaque foreign bodies. IMPRESSION: 1. No acute displaced fracture or dislocation. 2. Left hip arthroplasty. Electronically Signed   By: Jules Schick M.D.   On: 04/30/2023 15:21   DG Chest 2 View  Result Date: 04/30/2023 CLINICAL DATA:  Shortness of breath. EXAM: CHEST - 2 VIEW COMPARISON:  04/12/2023. FINDINGS: There are probable atelectatic changes at the left lung base. Bilateral lung fields are otherwise clear. No acute consolidation or lung collapse. Bilateral costophrenic angles are clear. Normal cardio-mediastinal silhouette. Aortic arch calcifications noted. No acute osseous abnormalities. Partially seen right reverse total shoulder arthroplasty.  T There is a left-sided dual lead cardiac pacemaker. The soft tissues are within normal limits. IMPRESSION: No active cardiopulmonary disease. Electronically Signed   By: Jules Schick M.D.   On: 04/30/2023 15:17   DG Chest 2 View  Result Date: 04/12/2023 CLINICAL DATA:  cough/ recent pneumonia EXAM: CHEST - 2 VIEW COMPARISON:  CXR 03/25/23 FINDINGS: Left-sided dual lead cardiac device in  place with unchanged lead positioning. Right shoulder arthroplasty. No pleural effusion. No pneumothorax. Unchanged cardiac and  mediastinal contours. Unchanged left infrahilar airspace opacity, which could represent atelectasis or infection. No radiographically apparent displaced rib fractures. Visualized upper abdomen is unremarkable. Vertebral body heights are maintained. IMPRESSION: Unchanged left infrahilar airspace opacity, which could represent atelectasis or infection. Electronically Signed   By: Lorenza Cambridge M.D.   On: 04/12/2023 14:31   DG Abdomen 1 View  Result Date: 04/12/2023 CLINICAL DATA:  Constipation, nausea, vomiting EXAM: ABDOMEN - 1 VIEW COMPARISON:  None Available. FINDINGS: There is a moderate stool burden throughout the colon. There is no evidence of mechanical obstruction. There is no definite free intraperitoneal air. A 1.5 cm metallic density object projecting over the right colon on the first images of uncertain etiology. Cholecystectomy clips are noted. There is S shaped scoliosis of the lumbar spine with multilevel degenerative changes. Postsurgical changes in both femurs are noted without evidence of complication. IMPRESSION: Moderate colonic stool burden without evidence of obstruction. Electronically Signed   By: Lesia Hausen M.D.   On: 04/12/2023 14:30    ECHO 01/2023: 1. Left ventricular ejection fraction, by estimation, is 60 to 65%. The left ventricle has normal function. The left ventricle has no regional wall motion abnormalities. There is moderate concentric left ventricular hypertrophy. Left ventricular diastolic parameters are consistent with Grade I diastolic dysfunction (impaired relaxation).   2. Right ventricular systolic function is normal. The right ventricular size is normal.   3. The mitral valve is grossly normal. Mild mitral valve regurgitation.   4. The aortic valve is calcified. Aortic valve regurgitation is trivial. Aortic valve sclerosis/calcification is present, without any evidence of aortic stenosis.   TELEMETRY reviewed by me 05/02/2023: sinus rhythm rate 60s  EKG  reviewed by me: sinus rhythm rate 76 bpm  Data reviewed by me 05/02/2023: last 24h vitals tele labs imaging I/O hospitalist progress note  Principal Problem:   Acute hypoxic respiratory failure (HCC) Active Problems:   Hypomagnesemia   COPD with acute exacerbation (HCC)   Acute on chronic diastolic CHF (congestive heart failure) (HCC)   Paroxysmal atrial fibrillation (HCC)   Type II diabetes mellitus with renal manifestations (HCC)   Hypokalemia   Myocardial injury   Overweight (BMI 25.0-29.9)   Left hip pain    ASSESSMENT AND PLAN:  Leslie Parker is a 89 y.o. female  with a past medical history of chronic diastolic heart failure, sick sinus syndrome s/p [employer] DC PPM implant 11/15/2022, paroxysmal AF (eliquis), DM2, COPD who presented to the ED on 04/30/2023 for hip pain. She also endorsed worsening SOB and LE edema. Cardiology was consulted for further evaluation.   # Acute on chronic HFpEF # Demand ischemia # Hypertension Patient presenting with worsening SOB and leg swelling. Reportedly had not been taking some of her medications. BNP elevated at 748. Troponins trended 51 > 68 > 74. She is without chest pain or anginal symptoms.  -Hold further diuresis today. Consider restarting po tomorrow pending renal function.  -Troponin elevation likely 2/2 demand supply mismatch in the setting of acute heart failure.   # SSS s/p DC PPM 10/2022 # Paroxysmal atrial fibrillation Device appears to be functioning well, no evidence of atrial fibrillation on telemetry. Patient without complaints of palpitations, heart racing, dizziness.  -Continue eliquis 2.5 mg twice daily for stroke risk reduction.  -Continue amiodarone 200 mg daily for rhythm control.  -Continue metoprolol 25 mg bid and diltiazem 360 mg  daily for rate control.  # Chronic kidney disease stage IIIb # Hypokalemia Cr 1.5 on presentation, Cr up to 2.04 today. -Continue to monitor renal function closely with  diuresis. -Monitor and replenish electrolytes for a goal K >4, Mag >2    This patient's plan of care was discussed and created with Dr. Darrold Junker and he is in agreement.  Signed: Gale Journey, PA-C  05/02/2023, 8:12 AM [facility]

## 2023-05-02 NOTE — Plan of Care (Signed)
  Problem: Fluid Volume: Goal: Hemodynamic stability will improve Outcome: Progressing   Problem: Clinical Measurements: Goal: Diagnostic test results will improve Outcome: Progressing Goal: Signs and symptoms of infection will decrease Outcome: Progressing   Problem: Respiratory: Goal: Ability to maintain adequate ventilation will improve Outcome: Progressing   Problem: Education: Goal: Knowledge of cardiac device and self-care will improve Outcome: Progressing Goal: Ability to safely manage health related needs after discharge will improve Outcome: Progressing Goal: Individualized Educational Video(s) Outcome: Progressing   Problem: Cardiac: Goal: Ability to achieve and maintain adequate cardiopulmonary perfusion will improve Outcome: Progressing   Problem: Education: Goal: Ability to demonstrate management of disease process will improve Outcome: Progressing Goal: Ability to verbalize understanding of medication therapies will improve Outcome: Progressing Goal: Individualized Educational Video(s) Outcome: Progressing   Problem: Activity: Goal: Capacity to carry out activities will improve Outcome: Progressing   Problem: Cardiac: Goal: Ability to achieve and maintain adequate cardiopulmonary perfusion will improve Outcome: Progressing   Problem: Education: Goal: Knowledge of General Education information will improve Description: Including pain rating scale, medication(s)/side effects and non-pharmacologic comfort measures Outcome: Progressing   Problem: Health Behavior/Discharge Planning: Goal: Ability to manage health-related needs will improve Outcome: Progressing   Problem: Clinical Measurements: Goal: Ability to maintain clinical measurements within normal limits will improve Outcome: Progressing Goal: Will remain free from infection Outcome: Progressing Goal: Diagnostic test results will improve Outcome: Progressing Goal: Respiratory complications  will improve Outcome: Progressing Goal: Cardiovascular complication will be avoided Outcome: Progressing   Problem: Activity: Goal: Risk for activity intolerance will decrease Outcome: Progressing   Problem: Nutrition: Goal: Adequate nutrition will be maintained Outcome: Progressing   Problem: Coping: Goal: Level of anxiety will decrease Outcome: Progressing   Problem: Elimination: Goal: Will not experience complications related to bowel motility Outcome: Progressing Goal: Will not experience complications related to urinary retention Outcome: Progressing   Problem: Pain Managment: Goal: General experience of comfort will improve Outcome: Progressing   Problem: Safety: Goal: Ability to remain free from injury will improve Outcome: Progressing   Problem: Skin Integrity: Goal: Risk for impaired skin integrity will decrease Outcome: Progressing   Problem: Education: Goal: Ability to describe self-care measures that may prevent or decrease complications (Diabetes Survival Skills Education) will improve Outcome: Progressing Goal: Individualized Educational Video(s) Outcome: Progressing   Problem: Coping: Goal: Ability to adjust to condition or change in health will improve Outcome: Progressing   Problem: Fluid Volume: Goal: Ability to maintain a balanced intake and output will improve Outcome: Progressing   Problem: Health Behavior/Discharge Planning: Goal: Ability to identify and utilize available resources and services will improve Outcome: Progressing Goal: Ability to manage health-related needs will improve Outcome: Progressing   Problem: Metabolic: Goal: Ability to maintain appropriate glucose levels will improve Outcome: Progressing   Problem: Nutritional: Goal: Maintenance of adequate nutrition will improve Outcome: Progressing Goal: Progress toward achieving an optimal weight will improve Outcome: Progressing   Problem: Skin Integrity: Goal: Risk  for impaired skin integrity will decrease Outcome: Progressing   Problem: Tissue Perfusion: Goal: Adequacy of tissue perfusion will improve Outcome: Progressing

## 2023-05-02 NOTE — Progress Notes (Signed)
Occupational Therapy Treatment Patient Details Name: Leslie Parker MRN: [id number] DOB: 27-Feb-1933 Today's Date: 05/02/2023   History of present illness 89 y/o female presented to ED on 04/30/23 for L hip cyst and pain x 1 week as well as SOB and LE edema. Admitted for acute hypoxic respiratory failure and acute exacerbation of CHF. PMH: sick sinus syndrome s/p pacemaker placement 4/25, HTN, Afib, DM, CKD stage 3, COPD   OT comments  Leslie Parker was seen for OT treatment on this date. Upon arrival to room pt reclined in bed, agreeable to tx. Pt requires SUPERVISION + RW for toilet t/f, pericare, and grooming standing. MOD I don/doff underwear in sitting. Pt making good progress toward goals, will continue to follow POC. Discharge recommendation remains appropriate.        If plan is discharge home, recommend the following:  A little help with walking and/or transfers;A little help with bathing/dressing/bathroom;Help with stairs or ramp for entrance   Equipment Recommendations  BSC/3in1    Recommendations for Other Services      Precautions / Restrictions Precautions Precautions: Fall Restrictions Weight Bearing Restrictions: No       Mobility Bed Mobility Overal bed mobility: Needs Assistance Bed Mobility: Supine to Sit     Supine to sit: Supervision          Transfers Overall transfer level: Needs assistance Equipment used: Rolling walker (2 wheels) Transfers: Sit to/from Stand Sit to Stand: Supervision                 Balance Overall balance assessment: Needs assistance Sitting-balance support: Feet supported Sitting balance-[name]: Normal     Standing balance support: No upper extremity supported, During functional activity Standing balance-[name]: Good                             ADL either performed or assessed with clinical judgement   ADL Overall ADL's : Needs assistance/impaired                                        General ADL Comments: SUPERVISION + RW for toilet t/f, pericare, and grooming standing. MOD I don under wear in sitting      Cognition Arousal: Alert Behavior During Therapy: WFL for tasks assessed/performed Overall Cognitive Status: Within Functional Limits for tasks assessed                                                     Pertinent Vitals/ Pain       Pain Assessment Pain Assessment: No/denies pain   Frequency  Min 1X/week        Progress Toward Goals  OT Goals(current goals can now be found in the care plan section)  Progress towards OT goals: Progressing toward goals  Acute Rehab OT Goals Patient Stated Goal: to go home OT Goal Formulation: With patient Time For Goal Achievement: 05/15/23 Potential to Achieve Goals: Good ADL Goals Pt Will Perform Grooming: with modified independence;standing Pt Will Perform Lower Body Dressing: with modified independence;sit to/from stand Pt Will Transfer to Toilet: with modified independence;ambulating;regular height toilet  Plan      Co-evaluation  AM-PAC OT "6 Clicks" Daily Activity     Outcome Measure   Help from another person eating meals?: None Help from another person taking care of personal grooming?: A Little Help from another person toileting, which includes using toliet, bedpan, or urinal?: A Little Help from another person bathing (including washing, rinsing, drying)?: A Little Help from another person to put on and taking off regular upper body clothing?: None Help from another person to put on and taking off regular lower body clothing?: A Little 6 Click Score: 20    End of Session    OT Visit Diagnosis: Other abnormalities of gait and mobility (R26.89);Unsteadiness on feet (R26.81)   Activity Tolerance Patient tolerated treatment well   Patient Left in bed;with call bell/phone within reach   Nurse Communication          Time: 1355-1415 OT Time  Calculation (min): 20 min  Charges: OT General Charges $OT Visit: 1 Visit OT Treatments $Self Care/Home Management : 8-22 mins  Kathie Dike, M.S. OTR/L  05/02/23, 2:20 PM  ascom [phone]

## 2023-05-02 NOTE — Assessment & Plan Note (Addendum)
AKI on CKD stage IIIb.  Creatinine peaked at 2.47 with overdiuresis.  Hold Lasix.  Baseline creatinine around 1.57.  Today's creatinine 2.28.  Check BMP tomorrow.  Will give a fluid bolus today.

## 2023-05-03 DIAGNOSIS — J441 Chronic obstructive pulmonary disease with (acute) exacerbation: Secondary | ICD-10-CM | POA: Diagnosis not present

## 2023-05-03 DIAGNOSIS — I5033 Acute on chronic diastolic (congestive) heart failure: Secondary | ICD-10-CM | POA: Diagnosis not present

## 2023-05-03 DIAGNOSIS — N179 Acute kidney failure, unspecified: Secondary | ICD-10-CM | POA: Diagnosis not present

## 2023-05-03 DIAGNOSIS — J9601 Acute respiratory failure with hypoxia: Secondary | ICD-10-CM | POA: Diagnosis not present

## 2023-05-03 LAB — BASIC METABOLIC PANEL
Anion gap: 10 (ref 5–15)
BUN: 55 mg/dL — ABNORMAL HIGH (ref 8–23)
CO2: 23 mmol/L (ref 22–32)
Calcium: 8.2 mg/dL — ABNORMAL LOW (ref 8.9–10.3)
Chloride: 99 mmol/L (ref 98–111)
Creatinine, Ser: 2.47 mg/dL — ABNORMAL HIGH (ref 0.44–1.00)
GFR, Estimated: 18 mL/min — ABNORMAL LOW (ref >60)
Glucose, Bld: 265 mg/dL — ABNORMAL HIGH (ref 70–99)
Potassium: 4.8 mmol/L (ref 3.5–5.1)
Sodium: 132 mmol/L — ABNORMAL LOW (ref 135–145)

## 2023-05-03 LAB — GLUCOSE, CAPILLARY
Glucose-Capillary: 277 mg/dL — ABNORMAL HIGH (ref 70–99)
Glucose-Capillary: 171 mg/dL — ABNORMAL HIGH (ref 70–99)
Glucose-Capillary: 184 mg/dL — ABNORMAL HIGH (ref 70–99)
Glucose-Capillary: 240 mg/dL — ABNORMAL HIGH (ref 70–99)

## 2023-05-03 LAB — CBC
HCT: 33.8 % — ABNORMAL LOW (ref 36.0–46.0)
Hemoglobin: 11.1 g/dL — ABNORMAL LOW (ref 12.0–15.0)
MCH: 30.1 pg (ref 26.0–34.0)
MCHC: 32.8 g/dL (ref 30.0–36.0)
MCV: 91.6 fL (ref 80.0–100.0)
Platelets: 203 10*3/uL (ref 150–400)
RBC: 3.69 MIL/uL — ABNORMAL LOW (ref 3.87–5.11)
RDW: 13.3 % (ref 11.5–15.5)
WBC: 11.7 10*3/uL — ABNORMAL HIGH (ref 4.0–10.5)
nRBC: 0 % (ref 0.0–0.2)

## 2023-05-03 MED ORDER — AZITHROMYCIN 250 MG PO TABS
250.0000 mg | ORAL_TABLET | Freq: Every day | ORAL | Status: AC
  Administered 2023-05-04 – 2023-05-07 (×4): 250 mg via ORAL
  Filled 2023-05-03 (×4): qty 1

## 2023-05-03 MED ORDER — AZITHROMYCIN 250 MG PO TABS
500.0000 mg | ORAL_TABLET | Freq: Every day | ORAL | Status: AC
  Administered 2023-05-03: 500 mg via ORAL
  Filled 2023-05-03: qty 2

## 2023-05-03 MED ORDER — IPRATROPIUM-ALBUTEROL 0.5-2.5 (3) MG/3ML IN SOLN
3.0000 mL | Freq: Two times a day (BID) | RESPIRATORY_TRACT | Status: DC
  Administered 2023-05-04 – 2023-05-09 (×11): 3 mL via RESPIRATORY_TRACT
  Filled 2023-05-03 (×11): qty 3

## 2023-05-03 MED ORDER — SODIUM CHLORIDE 0.9 % IV BOLUS
250.0000 mL | Freq: Once | INTRAVENOUS | Status: AC
  Administered 2023-05-03: 250 mL via INTRAVENOUS

## 2023-05-03 MED ORDER — OXYCODONE-ACETAMINOPHEN 5-325 MG PO TABS
1.0000 | ORAL_TABLET | Freq: Three times a day (TID) | ORAL | Status: DC | PRN
  Administered 2023-05-08: 1 via ORAL
  Filled 2023-05-03: qty 1

## 2023-05-03 NOTE — Plan of Care (Signed)
  Problem: Fluid Volume: Goal: Hemodynamic stability will improve Outcome: Progressing   Problem: Clinical Measurements: Goal: Diagnostic test results will improve Outcome: Progressing Goal: Signs and symptoms of infection will decrease Outcome: Progressing   Problem: Respiratory: Goal: Ability to maintain adequate ventilation will improve Outcome: Progressing   Problem: Education: Goal: Knowledge of cardiac device and self-care will improve Outcome: Progressing Goal: Ability to safely manage health related needs after discharge will improve Outcome: Progressing Goal: Individualized Educational Video(s) Outcome: Progressing   Problem: Cardiac: Goal: Ability to achieve and maintain adequate cardiopulmonary perfusion will improve Outcome: Progressing   Problem: Education: Goal: Ability to demonstrate management of disease process will improve Outcome: Progressing Goal: Ability to verbalize understanding of medication therapies will improve Outcome: Progressing Goal: Individualized Educational Video(s) Outcome: Progressing   Problem: Activity: Goal: Capacity to carry out activities will improve Outcome: Progressing   Problem: Cardiac: Goal: Ability to achieve and maintain adequate cardiopulmonary perfusion will improve Outcome: Progressing   Problem: Education: Goal: Knowledge of General Education information will improve Description: Including pain rating scale, medication(s)/side effects and non-pharmacologic comfort measures Outcome: Progressing   Problem: Health Behavior/Discharge Planning: Goal: Ability to manage health-related needs will improve Outcome: Progressing   Problem: Clinical Measurements: Goal: Ability to maintain clinical measurements within normal limits will improve Outcome: Progressing Goal: Will remain free from infection Outcome: Progressing Goal: Diagnostic test results will improve Outcome: Progressing Goal: Respiratory complications  will improve Outcome: Progressing Goal: Cardiovascular complication will be avoided Outcome: Progressing   Problem: Activity: Goal: Risk for activity intolerance will decrease Outcome: Progressing   Problem: Nutrition: Goal: Adequate nutrition will be maintained Outcome: Progressing   Problem: Coping: Goal: Level of anxiety will decrease Outcome: Progressing   Problem: Elimination: Goal: Will not experience complications related to bowel motility Outcome: Progressing Goal: Will not experience complications related to urinary retention Outcome: Progressing   Problem: Pain Managment: Goal: General experience of comfort will improve Outcome: Progressing   Problem: Safety: Goal: Ability to remain free from injury will improve Outcome: Progressing   Problem: Skin Integrity: Goal: Risk for impaired skin integrity will decrease Outcome: Progressing   Problem: Education: Goal: Ability to describe self-care measures that may prevent or decrease complications (Diabetes Survival Skills Education) will improve Outcome: Progressing Goal: Individualized Educational Video(s) Outcome: Progressing   Problem: Coping: Goal: Ability to adjust to condition or change in health will improve Outcome: Progressing   Problem: Fluid Volume: Goal: Ability to maintain a balanced intake and output will improve Outcome: Progressing   Problem: Health Behavior/Discharge Planning: Goal: Ability to identify and utilize available resources and services will improve Outcome: Progressing Goal: Ability to manage health-related needs will improve Outcome: Progressing   Problem: Metabolic: Goal: Ability to maintain appropriate glucose levels will improve Outcome: Progressing   Problem: Nutritional: Goal: Maintenance of adequate nutrition will improve Outcome: Progressing Goal: Progress toward achieving an optimal weight will improve Outcome: Progressing   Problem: Skin Integrity: Goal: Risk  for impaired skin integrity will decrease Outcome: Progressing   Problem: Tissue Perfusion: Goal: Adequacy of tissue perfusion will improve Outcome: Progressing

## 2023-05-03 NOTE — Progress Notes (Signed)
Progress Note   Patient: Leslie Parker ZOX:[id number] DOB: 02/02/1933 DOA: 04/30/2023     3 DOS: the patient was seen and examined on 05/03/2023   Brief hospital course: 89 year old female past medical history of chronic diastolic dysfunction, pacemaker, history of atrial fibrillation on chronic Eliquis, type 2 diabetes mellitus with stage III chronic kidney disease, COPD, recent left hip arthroplasty.  She has had worsening hip pain over the last week found to have an elevated blood pressure came in with shortness of breath and found to be hypoxic.  10/9.  Started on Solu-Medrol secondary to diffuse wheezing and COPD exacerbation.  Replacing IV magnesium. 10/10.  Creatinine up to 2.04.  Holding Lasix.  Continue Solu-Medrol today. 10/11.  Creatinine up to 2.47.  Will give fluid bolus today.  Hold steroids with confusion.  Has upper airway wheeze.  Continue nebulizer.   Assessment and Plan: * Acute kidney injury superimposed on CKD (HCC) AKI on CKD stage IIIb.  Creatinine up to 2.47 with overdiuresis.  Hold Lasix.  Baseline creatinine around 1.57.  Will give 2 fluid boluses today.  COPD with acute exacerbation (HCC) Discontinue steroids continue albuterol nebulizers.  Patient takes Trelegy at home.  Continue Trelegy substitute here.  Continue empiric antibiotics Augmentin and Zithromax.  Acute respiratory failure with hypoxia (HCC) Patient was noted to be tachypneic and had room air pulse oximetry of 88%.  Patient currently off oxygen   Acute on chronic diastolic CHF (congestive heart failure) (HCC) Lasix held with overdiuresis.  Last echocardiogram shows an EF of 60%  Paroxysmal atrial fibrillation (HCC) Continue amiodarone, metoprolol and Cardizem for rate control Continue Eliquis as primary prophylaxis for an acute stroke  Type II diabetes mellitus with renal manifestations (HCC) Patient with diabetes mellitus with complications of stage IIIb chronic kidney disease Hold  glipizide Monitor renal function closely while on diuretic therapy Maintain consistent carbohydrate diet Last hemoglobin A1c is 5.8 Sugars will improve with holding steroids today.   Left hip pain Seen by orthopedic and recommended PT  Overweight (BMI 25.0-29.9) BMI 28.49  Myocardial injury Likely secondary to acute respiratory failure, CHF and COPD exacerbation.  Hypokalemia Replaced  Hypomagnesemia Replaced        Subjective: Patient feels okay with regards to her breathing.  Nursing staff noticed some confusion.  Patient's granddaughter noticed some behavioral changes last night.  Will stop steroids.  Will give fluid bolus secondary to elevated creatinine.  Admitted with acute respiratory failure and COPD exacerbation  Physical Exam: Vitals:   05/03/23 0344 05/03/23 0733 05/03/23 0821 05/03/23 1140  BP:   (!) 175/71 (!) 172/73  Pulse:  61 61 64  Resp:  18 18 18   Temp:   97.7 F (36.5 C) 98.1 F (36.7 C)  TempSrc:   Oral Oral  SpO2:  95% 97% 99%  Weight: 87.5 kg     Height:       Physical Exam HENT:     Head: Normocephalic.     Mouth/Throat:     Pharynx: No oropharyngeal exudate.  Eyes:     General: Lids are normal.     Conjunctiva/sclera: Conjunctivae normal.  Cardiovascular:     Rate and Rhythm: Normal rate and regular rhythm.     Heart sounds: S1 normal and S2 normal. Murmur heard.     Systolic murmur is present with a grade of 2/6.  Pulmonary:     Breath sounds: Transmitted upper airway sounds present. Examination of the right-lower field reveals decreased breath sounds and  wheezing. Examination of the left-lower field reveals decreased breath sounds and wheezing. Decreased breath sounds and wheezing present. No rhonchi or rales.     Comments: More upper airway sounds today. Abdominal:     Palpations: Abdomen is soft.     Tenderness: There is no abdominal tenderness.  Musculoskeletal:     Right lower leg: Swelling present.     Left lower leg:  Swelling present.  Skin:    General: Skin is warm.     Findings: No rash.  Neurological:     Mental Status: She is alert and oriented to person, place, and time.     Data Reviewed: Creatinine 2.47, sodium 132, hemoglobin 11.1, white blood cell count 11.7  Family Communication: Spoke with granddaughter on the phone  Disposition: Status is: Inpatient Remains inpatient appropriate because: With creatinine rise I will give 2 fluid boluses today.  Planned Discharge Destination: Home with Home Health    Time spent: 28 minutes  Author: Alford Highland, MD 05/03/2023 12:06 PM  For on call review [email or web address].

## 2023-05-03 NOTE — Progress Notes (Addendum)
[facility] CARDIOLOGY CONSULT NOTE       Patient ID: Leslie Parker MRN: [id number] DOB/AGE: [birth year] 89 y.o.  Admit date: 04/30/2023 Referring Physician Dr. Joylene Igo Primary Physician Dr. Nemiah Commander Primary Cardiologist Dr. Juliann Pares Reason for Consultation AoCHFpEF  HPI: Leslie Parker is a 89 y.o. female  with a past medical history of chronic diastolic heart failure, sick sinus syndrome s/p [employer] DC PPM implant 11/15/2022, paroxysmal AF (eliquis), DM2, COPD who presented to the ED on 04/30/2023 for hip pain. She also endorsed worsening SOB and LE edema. Cardiology was consulted for further evaluation.   Interval history: -Patient reports she feels well, reports mild SOB which is improved overall. LE edema improved.  -Cr elevated again today, plan for IV fluids today. -Continue to report hip pain. Has been seen by ortho who recommended PT.   Review of systems complete and found to be negative unless listed above    Past Medical History:  Diagnosis Date   Asthma    Atrial fibrillation with RVR (HCC)    Cancer (HCC)    Basal Cell   Diabetes mellitus without complication (HCC)    Heart murmur    Hypertension    Impetigo    Osteoarthritis    Osteopenia    Vomiting    can not due to surgery   Wears dentures    full upper and lower    Past Surgical History:  Procedure Laterality Date   ABDOMINAL HYSTERECTOMY     BACK SURGERY     BLADDER SURGERY     mesh   CARPAL TUNNEL RELEASE Bilateral    CATARACT EXTRACTION Right 2017   CATARACT EXTRACTION W/PHACO Left 02/06/2016   Procedure: CATARACT EXTRACTION PHACO AND INTRAOCULAR LENS PLACEMENT (IOC) left eye;  Surgeon: Sherald Hess, MD;  Location: [facility];  Service: Ophthalmology;  Laterality: Left;  DIABETIC LEFT Cannot arrive before 9:30   CERVICAL DISC SURGERY     CHOLECYSTECTOMY     EYE SURGERY Bilateral    Cataract Extraction with IOL   FEMUR IM NAIL Left 02/04/2015   Procedure:  INTRAMEDULLARY (IM) NAIL FEMORAL;  Surgeon: Juanell Fairly, MD;  Location: [facility];  Service: Orthopedics;  Laterality: Left;   HARDWARE REMOVAL Left 01/17/2017   Procedure: HARDWARE REMOVAL;  Surgeon: Juanell Fairly, MD;  Location: [facility];  Service: Orthopedics;  Laterality: Left;   HERNIA REPAIR  2014   esophageal and gastric mesh. patient unable to throw up d/t mesh   HIP ARTHROPLASTY Left 01/26/2017   Procedure: ARTHROPLASTY BIPOLAR HIP (HEMIARTHROPLASTY) removal hardware left hip;  Surgeon: Deeann Saint, MD;  Location: [facility];  Service: Orthopedics;  Laterality: Left;   INTRAMEDULLARY (IM) NAIL INTERTROCHANTERIC Right 02/13/2023   Procedure: INTRAMEDULLARY (IM) NAIL INTERTROCHANTERIC;  Surgeon: Christena Flake, MD;  Location: [facility];  Service: Orthopedics;  Laterality: Right;   JOINT REPLACEMENT Bilateral    knees   PACEMAKER IMPLANT N/A 11/15/2022   Procedure: PACEMAKER IMPLANT;  Surgeon: Marcina Millard, MD;  Location: [facility];  Service: Cardiovascular;  Laterality: N/A;   PACEMAKER IMPLANT N/A 11/28/2022   Procedure: PACEMAKER IMPLANT;  Surgeon: Marcina Millard, MD;  Location: [facility];  Service: Cardiovascular;  Laterality: N/A;  Lead reposition   REPLACEMENT TOTAL KNEE BILATERAL Bilateral [id number]   SHOULDER ARTHROSCOPY WITH OPEN ROTATOR CUFF REPAIR AND DISTAL CLAVICLE ACROMINECTOMY Left 10/25/2016   Procedure: SHOULDER ARTHROSCOPY WITH OPEN ROTATOR CUFF REPAIR AND DISTAL CLAVICLE ACROMINECTOMY;  Surgeon: Juanell Fairly, MD;  Location: [facility];  Service: Orthopedics;  Laterality: Left;   THYROID SURGERY     goiter removed   TOTAL HIP REVISION Left 12/02/2017   Procedure: TOTAL HIP REVISION;  Surgeon: Lyndle Herrlich, MD;  Location: [facility];  Service: Orthopedics;  Laterality: Left;   TOTAL SHOULDER REPLACEMENT Right 2012    Medications Prior to Admission  Medication Sig Dispense Refill Last Dose   amiodarone (PACERONE) 200 MG tablet Take 200  mg by mouth daily.      colestipol (COLESTID) 1 g tablet Take 1 tablet by mouth 2 (two) times daily.      diltiazem (CARDIZEM CD) 360 MG 24 hr capsule Take 360 mg by mouth daily.      furosemide (LASIX) 20 MG tablet Take 20 mg by mouth daily.      glipiZIDE (GLUCOTROL XL) 2.5 MG 24 hr tablet Take 2.5 mg by mouth daily with breakfast.      metoprolol tartrate (LOPRESSOR) 25 MG tablet Take 1 tablet (25 mg total) by mouth 2 (two) times daily.      omeprazole (PRILOSEC) 20 MG capsule Take 20 mg by mouth every morning.      oxyCODONE-acetaminophen (PERCOCET/ROXICET) 5-325 MG tablet Take 1 tablet by mouth every 4 (four) hours as needed for moderate pain. 30 tablet 0    traZODone (DESYREL) 100 MG tablet Take 100 mg by mouth at bedtime.      TRELEGY ELLIPTA 100-62.5-25 MCG/ACT AEPB Inhale 1 puff into the lungs daily.      albuterol (VENTOLIN HFA) 108 (90 Base) MCG/ACT inhaler Inhale 2 puffs into the lungs every 6 (six) hours as needed.      apixaban (ELIQUIS) 2.5 MG TABS tablet Take 1 tablet (2.5 mg total) by mouth 2 (two) times daily. 60 tablet 1    ascorbic acid (VITAMIN C) 250 MG CHEW Chew 250 mg by mouth daily.      bisacodyl (DULCOLAX) 10 MG suppository Place 1 suppository (10 mg total) rectally daily as needed for moderate constipation.      Cholecalciferol (VITAMIN D-1000 MAX ST) 25 MCG (1000 UT) tablet Take 1,000 Units by mouth daily.      Cyanocobalamin (B-12) 2500 MCG TABS Take 2,500 mcg by mouth daily.      docusate sodium (COLACE) 100 MG capsule Take 1 capsule (100 mg total) by mouth 2 (two) times daily.      feeding supplement (ENSURE ENLIVE / ENSURE PLUS) LIQD Take 237 mLs by mouth 2 (two) times daily between meals.      ipratropium-albuterol (DUONEB) 0.5-2.5 (3) MG/3ML SOLN Inhale 3 mLs into the lungs every 6 (six) hours as needed.      Magnesium Oxide 500 MG CAPS Take 500 mg by mouth daily.      methocarbamol (ROBAXIN) 500 MG tablet Take 1 tablet (500 mg total) by mouth every 8 (eight)  hours as needed for muscle spasms.      Multiple Vitamin (MULTIVITAMIN WITH MINERALS) TABS tablet Take 1 tablet by mouth daily. One-A-Day Women's Vitamin      polyethylene glycol powder (MIRALAX) 17 GM/SCOOP powder 1 capful twice a day in 8 oz of juice or water until you have a bowel movement and then once a day every day 255 g 1    Social History   Socioeconomic History   Marital status: Divorced    Spouse name: Not on file   Number of children: Not on file   Years of education: Not on file   Highest  education level: Not on file  Occupational History   Not on file  Tobacco Use   Smoking status: Never   Smokeless tobacco: Never  Vaping Use   Vaping status: Never Used  Substance and Sexual Activity   Alcohol use: No    Alcohol/week: 0.0 standard drinks of alcohol   Drug use: No   Sexual activity: Never  Other Topics Concern   Not on file  Social History Narrative   Not on file   Social Determinants of Health   Financial Resource Strain: Not on file  Food Insecurity: No Food Insecurity (05/01/2023)   Hunger Vital Sign    Worried About Running Out of Food in the Last Year: Never true    Ran Out of Food in the Last Year: Never true  Transportation Needs: No Transportation Needs (05/01/2023)   PRAPARE - Administrator, Civil Service (Medical): No    Lack of Transportation (Non-Medical): No  Physical Activity: Not on file  Stress: Not on file  Social Connections: Not on file  Intimate Partner Violence: Not At Risk (05/01/2023)   Humiliation, Afraid, Rape, and Kick questionnaire    Fear of Current or Ex-Partner: No    Emotionally Abused: No    Physically Abused: No    Sexually Abused: No    Family History  Problem Relation Age of Onset   Heart failure Mother    Hypertension Mother    Emphysema Father      Vitals:   05/03/23 0343 05/03/23 0344 05/03/23 0733 05/03/23 0821  BP: (!) 153/69   (!) 175/71  Pulse: (!) 59  61 61  Resp: 20  18 18   Temp: 98.1 F  (36.7 C)   97.7 F (36.5 C)  TempSrc:    Oral  SpO2: 97%  95% 97%  Weight:  87.5 kg    Height:        PHYSICAL EXAM General: Well appearing, well nourished, in no acute distress sitting upright in bedside chair. HEENT: Normocephalic and atraumatic. Neck: No JVD.  Lungs: Normal respiratory effort on room air. Clear bilaterally to auscultation. No wheezes, crackles, rhonchi.  Heart: HRRR. Normal S1 and S2 without gallops or murmurs.  Abdomen: Non-distended appearing.  Msk: Normal strength and tone for age. Extremities: Warm and well perfused. No clubbing, cyanosis. Trace edema bilaterally.  Neuro: Alert and oriented X 3. Psych: Answers questions appropriately.   Labs: Basic Metabolic Panel: Recent Labs    04/30/23 1935 05/01/23 0245 05/02/23 0512 05/03/23 0349  NA  --    < > 132* 132*  K  --    < > 4.2 4.8  CL  --    < > 98 99  CO2  --    < > 23 23  GLUCOSE  --    < > 364* 265*  BUN  --    < > 37* 55*  CREATININE  --    < > 2.04* 2.47*  CALCIUM  --    < > 8.0* 8.2*  MG 1.2*  --  1.9  --    < > = values in this interval not displayed.   Liver Function Tests: Recent Labs    04/30/23 1050  AST 38  ALT 29  ALKPHOS 143*  BILITOT 1.1  PROT 7.1  ALBUMIN 3.7   Recent Labs    04/30/23 1050  LIPASE 20   CBC: Recent Labs    05/01/23 0245 05/03/23 0349  WBC 5.6 11.7*  HGB  10.9* 11.1*  HCT 33.0* 33.8*  MCV 91.7 91.6  PLT 190 203   Cardiac Enzymes: Recent Labs    04/30/23 1050 04/30/23 1737 04/30/23 1935  TROPONINIHS 51* 68* 74*   BNP: Recent Labs    04/30/23 1050  BNP 748.3*   D-Dimer: No results for input(s): "DDIMER" in the last 72 hours. Hemoglobin A1C: No results for input(s): "HGBA1C" in the last 72 hours. Fasting Lipid Panel: No results for input(s): "CHOL", "HDL", "LDLCALC", "TRIG", "CHOLHDL", "LDLDIRECT" in the last 72 hours. Thyroid Function Tests: No results for input(s): "TSH", "T4TOTAL", "T3FREE", "THYROIDAB" in the last 72  hours.  Invalid input(s): "FREET3" Anemia Panel: No results for input(s): "VITAMINB12", "FOLATE", "FERRITIN", "TIBC", "IRON", "RETICCTPCT" in the last 72 hours.   Radiology: CT CHEST WO CONTRAST  Result Date: 04/30/2023 CLINICAL DATA:  Shortness of breath.  Pneumonia EXAM: CT CHEST WITHOUT CONTRAST TECHNIQUE: Multidetector CT imaging of the chest was performed following the standard protocol without IV contrast. RADIATION DOSE REDUCTION: This exam was performed according to the departmental dose-optimization program which includes automated exposure control, adjustment of the mA and/or kV according to patient size and/or use of iterative reconstruction technique. COMPARISON:  X-ray earlier 04/30/2023. Old CT scan 03/21/2023. Other exams as well. FINDINGS: Cardiovascular: Heart is nonenlarged. No significant pericardial effusion. Battery pack along the left upper chest for pacemaker with leads extending along the right side of the heart. Coronary artery calcifications are seen. The thoracic aorta has a normal course and caliber with scattered calcified atherosclerotic plaque. Mediastinum/Nodes: Moderate hiatal hernia. Normal caliber thoracic esophagus which is slightly patulous. Heterogeneous thyroid gland with multiple cysts and calcifications, unchanged from previous examination. No specific abnormal lymph node enlargement identified in the axillary regions, hilum or mediastinum. Only a few small less than 1 cm in size in short axis mediastinal nodes are seen, nonpathologic by size criteria. Lungs/Pleura: Previous pleural effusions are no longer seen by CT. There is some lower lobe areas of bronchial wall thickening with subtle areas of luminal opacity, left-greater-than-right. Associated bandlike areas of opacity along both lung bases. Atelectasis is favored. There is also some bandlike changes along the posteroinferior right upper lobe. No frank consolidation, pneumothorax. Slight areas of patchy  ground-glass along the posteroinferior right upper lobe such as axial series 3, image 62. the previous areas of ground-glass opacity with nodularity in the superior segment left lower lobe have improved and may have been infectious or inflammatory. Upper Abdomen: Dilated biliary tree, unchanged from previous. Nodular contours of the liver. Cystic lesion in the spleen. Numerous cystic foci along the kidneys several which are complex and hyperdense. Is also a cystic area posterior to the stomach on image 140 of series 2. Again these changes are stable from the prior more recent chest CT. Please correlate for any known prior workup or dedicated evaluation when appropriate there is remote MRI as well from May 2022 Musculoskeletal: Streak artifact related to the patient's right shoulder arthroplasty. Moderate degenerative changes of the spine. Slight compression of the inferior endplate of T12, unchanged from previous. IMPRESSION: Changing lung opacities from the prior CT scan of August 2024. Improved areas of nodular opacity in the superior segment left lower lobe with improved pleural effusions. There is persistent lower lung bilateral bronchial wall thickening and some bronchial debris and mucous plugging. New subtle patchy ground-glass along the posteroinferior right upper lobe. Recommend continued follow-up. Hiatal hernia. Numerous changes in the abdomen including biliary ductal dilatation, splenic, renal and retroperitoneal cystic lesions. Please correlate  with previous workup or additional evaluation when appropriate. Pacemaker Aortic Atherosclerosis (ICD10-I70.0) and Emphysema (ICD10-J43.9). Electronically Signed   By: Karen Kays M.D.   On: 04/30/2023 17:58   CT HIP LEFT WO CONTRAST  Result Date: 04/30/2023 CLINICAL DATA:  Left hip pain with cyst for 1 week. Replacement 2 years ago. Recent green drainage, infection suspected. EXAM: CT OF THE LEFT HIP WITHOUT CONTRAST TECHNIQUE: Multidetector CT imaging of  the left hip was performed according to the standard protocol. Multiplanar CT image reconstructions were also generated. RADIATION DOSE REDUCTION: This exam was performed according to the departmental dose-optimization program which includes automated exposure control, adjustment of the mA and/or kV according to patient size and/or use of iterative reconstruction technique. COMPARISON:  Radiographs 04/30/2023, 02/13/2023 and 01/26/2017. FINDINGS: Despite efforts by the technologist and patient, mild motion artifact is present on today's exam and could not be eliminated. This reduces exam sensitivity and specificity. Bones/Joint/Cartilage Status post left total hip arthroplasty. There is a screw fixed acetabular component which appears intact without loosening. Evaluation of the proximal femur is limited by motion and beam hardening artifact from the femoral prosthesis. There is an intact proximal cerclage wire. No evidence of acute fracture or dislocation. Chronic nonunion of the greater trochanter. No definite bone destruction is seen, although is difficult to exclude endosteal thinning along the posterolateral aspect of the femoral prosthesis given the motion. No evidence of significant joint effusion. Ligaments Suboptimally assessed by CT. Muscles and Tendons Generalized muscular atrophy. No focal intramuscular fluid collection identified. Soft tissues There is stranding in the subcutaneous fat posterior and lateral to the left hip. No focal fluid collection, unexpected foreign body or soft tissue emphysema identified. Iliofemoral atherosclerosis noted. The visualized internal pelvic contents are notable for prominent sigmoid diverticulosis without evidence of acute inflammation. IMPRESSION: 1. Limited evaluation of the proximal left femur due to motion and beam hardening artifact from the femoral prosthesis. No definite bone destruction is seen, although endosteal thinning along the posterolateral aspect of the  femoral prosthesis is difficult to exclude. Follow-up may be warranted. 2. No evidence of acute fracture or dislocation. 3. Nonspecific subcutaneous stranding posterior and lateral to the left hip without focal fluid collection, unexpected foreign body or soft tissue emphysema. 4. Generalized muscular atrophy. Electronically Signed   By: Carey Bullocks M.D.   On: 04/30/2023 17:26   DG HIP UNILAT WITH PELVIS 2-3 VIEWS LEFT  Result Date: 04/30/2023 CLINICAL DATA:  Left hip pain. EXAM: DG HIP (WITH OR WITHOUT PELVIS) 2-3V LEFT COMPARISON:  01/26/2017. FINDINGS: Evaluation is limited due to artifact from overlying bowel gas. No acute displaced fracture or dislocation. No aggressive osseous lesion. There are changes of chronic pubic symphisitis. Left hip arthroplasty noted. There is open reduction and internal fixation of intertrochanteric right proximal femur fracture with trochanteric fixation nail and helical blade. No radiopaque foreign bodies. IMPRESSION: 1. No acute displaced fracture or dislocation. 2. Left hip arthroplasty. Electronically Signed   By: Jules Schick M.D.   On: 04/30/2023 15:21   DG Chest 2 View  Result Date: 04/30/2023 CLINICAL DATA:  Shortness of breath. EXAM: CHEST - 2 VIEW COMPARISON:  04/12/2023. FINDINGS: There are probable atelectatic changes at the left lung base. Bilateral lung fields are otherwise clear. No acute consolidation or lung collapse. Bilateral costophrenic angles are clear. Normal cardio-mediastinal silhouette. Aortic arch calcifications noted. No acute osseous abnormalities. Partially seen right reverse total shoulder arthroplasty.  T There is a left-sided dual lead cardiac pacemaker. The soft tissues  are within normal limits. IMPRESSION: No active cardiopulmonary disease. Electronically Signed   By: Jules Schick M.D.   On: 04/30/2023 15:17   DG Chest 2 View  Result Date: 04/12/2023 CLINICAL DATA:  cough/ recent pneumonia EXAM: CHEST - 2 VIEW COMPARISON:  CXR  03/25/23 FINDINGS: Left-sided dual lead cardiac device in place with unchanged lead positioning. Right shoulder arthroplasty. No pleural effusion. No pneumothorax. Unchanged cardiac and mediastinal contours. Unchanged left infrahilar airspace opacity, which could represent atelectasis or infection. No radiographically apparent displaced rib fractures. Visualized upper abdomen is unremarkable. Vertebral body heights are maintained. IMPRESSION: Unchanged left infrahilar airspace opacity, which could represent atelectasis or infection. Electronically Signed   By: Lorenza Cambridge M.D.   On: 04/12/2023 14:31   DG Abdomen 1 View  Result Date: 04/12/2023 CLINICAL DATA:  Constipation, nausea, vomiting EXAM: ABDOMEN - 1 VIEW COMPARISON:  None Available. FINDINGS: There is a moderate stool burden throughout the colon. There is no evidence of mechanical obstruction. There is no definite free intraperitoneal air. A 1.5 cm metallic density object projecting over the right colon on the first images of uncertain etiology. Cholecystectomy clips are noted. There is S shaped scoliosis of the lumbar spine with multilevel degenerative changes. Postsurgical changes in both femurs are noted without evidence of complication. IMPRESSION: Moderate colonic stool burden without evidence of obstruction. Electronically Signed   By: Lesia Hausen M.D.   On: 04/12/2023 14:30    ECHO 01/2023: 1. Left ventricular ejection fraction, by estimation, is 60 to 65%. The left ventricle has normal function. The left ventricle has no regional wall motion abnormalities. There is moderate concentric left ventricular hypertrophy. Left ventricular diastolic parameters are consistent with Grade I diastolic dysfunction (impaired relaxation).   2. Right ventricular systolic function is normal. The right ventricular size is normal.   3. The mitral valve is grossly normal. Mild mitral valve regurgitation.   4. The aortic valve is calcified. Aortic valve  regurgitation is trivial. Aortic valve sclerosis/calcification is present, without any evidence of aortic stenosis.   TELEMETRY reviewed by me 05/03/2023: atrial pacing rate 70s  EKG reviewed by me: sinus rhythm rate 76 bpm  Data reviewed by me 05/03/2023: last 24h vitals tele labs imaging I/O hospitalist progress note  Principal Problem:   COPD with acute exacerbation (HCC) Active Problems:   Acute respiratory failure with hypoxia (HCC)   Hypomagnesemia   Acute on chronic diastolic CHF (congestive heart failure) (HCC)   Acute kidney injury superimposed on CKD (HCC)   Paroxysmal atrial fibrillation (HCC)   Type II diabetes mellitus with renal manifestations (HCC)   Hypokalemia   Myocardial injury   Overweight (BMI 25.0-29.9)   Left hip pain    ASSESSMENT AND PLAN:  Leslie Parker is a 89 y.o. female  with a past medical history of chronic diastolic heart failure, sick sinus syndrome s/p [employer] DC PPM implant 11/15/2022, paroxysmal AF (eliquis), DM2, COPD who presented to the ED on 04/30/2023 for hip pain. She also endorsed worsening SOB and LE edema. Cardiology was consulted for further evaluation.   # Acute on chronic HFpEF # Demand ischemia # Hypertension Patient presenting with worsening SOB and leg swelling. Reportedly had not been taking some of her medications. BNP elevated at 748. Troponins trended 51 > 68 > 74. She is without chest pain or anginal symptoms.  -Hold further diuresis. IV fluids today to help with AKI. Plan to restart home po diuretics once Cr improved. -Troponin elevation likely 2/2 demand  supply mismatch in the setting of acute heart failure.   # SSS s/p DC PPM 10/2022 # Paroxysmal atrial fibrillation Device appears to be functioning well, no evidence of atrial fibrillation on telemetry. Patient without complaints of palpitations, heart racing, dizziness.  -Continue eliquis 2.5 mg twice daily for stroke risk reduction.  -Continue amiodarone 200 mg  daily for rhythm control.  -Continue metoprolol 25 mg bid and diltiazem 360 mg daily for rate control.  # Chronic kidney disease stage IIIb # Hypokalemia Cr 1.5 on presentation, Cr up to 2.47 today. -Continue to monitor renal function closely with diuresis. -Monitor and replenish electrolytes for a goal K >4, Mag >2    This patient's plan of care was discussed and created with Dr. Darrold Junker and he is in agreement.  Signed: Gale Journey, PA-C  05/03/2023, 9:11 AM [facility]

## 2023-05-03 NOTE — Plan of Care (Signed)
  Problem: Activity: Goal: Risk for activity intolerance will decrease Outcome: Progressing   Problem: Elimination: Goal: Will not experience complications related to urinary retention Outcome: Progressing   Problem: Pain Managment: Goal: General experience of comfort will improve Outcome: Progressing   Problem: Safety: Goal: Ability to remain free from injury will improve Outcome: Progressing   Problem: Skin Integrity: Goal: Risk for impaired skin integrity will decrease Outcome: Progressing   

## 2023-05-03 NOTE — Progress Notes (Signed)
Physical Therapy Treatment Patient Details Name: Leslie Parker MRN: [id number] DOB: [birth year] Today's Date: 05/03/2023   History of Present Illness 89 y/o female presented to ED on 04/30/23 for L hip cyst and pain x 1 week as well as SOB and LE edema. Admitted for acute hypoxic respiratory failure and acute exacerbation of CHF. PMH: sick sinus syndrome s/p pacemaker placement 4/25, HTN, Afib, DM, CKD stage 3, COPD    PT Comments  Patient supine in bed on arrival and pleasantly confused this date which is new from previous session. Patient completed bed mobility and sit to stand transfer with supervision. Ambulated 120' with RW and supervision although impulsive with fatigue while rushing back to room. Difficulty navigating tight spaces within room and required frequent cueing for RW management. Discharge plan remains appropriate as long as confusion clears as patient lives alone.    If plan is discharge home, recommend the following: A little help with walking and/or transfers;A little help with bathing/dressing/bathroom   Can travel by private vehicle        Equipment Recommendations  Rolling Gary Bultman (2 wheels)    Recommendations for Other Services       Precautions / Restrictions Precautions Precautions: Fall Restrictions Weight Bearing Restrictions: No     Mobility  Bed Mobility Overal bed mobility: Needs Assistance Bed Mobility: Supine to Sit, Sit to Supine     Supine to sit: Supervision Sit to supine: Supervision        Transfers Overall transfer level: Needs assistance Equipment used: Rolling Parminder Trapani (2 wheels) Transfers: Sit to/from Stand Sit to Stand: Supervision                Ambulation/Gait Ambulation/Gait assistance: Supervision Gait Distance (Feet): 120 Feet Assistive device: Rolling Roldan Laforest (2 wheels) Gait Pattern/deviations: Step-through pattern, Decreased stride length Gait velocity: decreased     General Gait Details: supervision for safety.  complaining of L hip soreness. Difficulty navigating tight spaces in room   Stairs             Wheelchair Mobility     Tilt Bed    Modified Rankin (Stroke Patients Only)       Balance Overall balance assessment: Needs assistance Sitting-balance support: Feet supported Sitting balance-[name]: Normal     Standing balance support: No upper extremity supported, During functional activity Standing balance-[name]: Good                              Cognition Arousal: Alert Behavior During Therapy: WFL for tasks assessed/performed Overall Cognitive Status: Impaired/Different from baseline Area of Impairment: Orientation, Memory, Safety/judgement                 Orientation Level: Disoriented to, Place, Time   Memory: Decreased short-term memory   Safety/Judgement: Decreased awareness of safety, Decreased awareness of deficits              Exercises      General Comments        Pertinent Vitals/Pain Pain Assessment Pain Assessment: Faces Faces Pain Scale: Hurts little more Pain Location: L hip Pain Descriptors / Indicators: Discomfort Pain Intervention(s): Limited activity within patient's tolerance, Monitored during session, Repositioned    Home Living                          Prior Function            PT  Goals (current goals can now be found in the care plan section) Acute Rehab PT Goals Patient Stated Goal: to go home PT Goal Formulation: With patient Time For Goal Achievement: 05/15/23 Potential to Achieve Goals: Good Progress towards PT goals: Progressing toward goals    Frequency    Min 1X/week      PT Plan      Co-evaluation              AM-PAC PT "6 Clicks" Mobility   Outcome Measure  Help needed turning from your back to your side while in a flat bed without using bedrails?: None Help needed moving from lying on your back to sitting on the side of a flat bed without using  bedrails?: A Little Help needed moving to and from a bed to a chair (including a wheelchair)?: A Little Help needed standing up from a chair using your arms (e.g., wheelchair or bedside chair)?: A Little Help needed to walk in hospital room?: A Little Help needed climbing 3-5 steps with a railing? : A Lot 6 Click Score: 18    End of Session   Activity Tolerance: Patient tolerated treatment well Patient left: with call bell/phone within reach;in bed;with bed alarm set Nurse Communication: Mobility status PT Visit Diagnosis: Muscle weakness (generalized) (M62.81);Difficulty in walking, not elsewhere classified (R26.2);Pain Pain - Right/Left: Left Pain - part of body: Hip     Time: 1610-9604 PT Time Calculation (min) (ACUTE ONLY): 19 min  Charges:    $Therapeutic Activity: 8-22 mins PT General Charges $$ ACUTE PT VISIT: 1 Visit                     [name], PT, DPT Physical Therapist - [facility]  [facility]    Carmon Brigandi A Ceasar Decandia 05/03/2023, 1:27 PM

## 2023-05-04 DIAGNOSIS — R410 Disorientation, unspecified: Secondary | ICD-10-CM | POA: Diagnosis not present

## 2023-05-04 DIAGNOSIS — J441 Chronic obstructive pulmonary disease with (acute) exacerbation: Secondary | ICD-10-CM | POA: Diagnosis not present

## 2023-05-04 DIAGNOSIS — I5033 Acute on chronic diastolic (congestive) heart failure: Secondary | ICD-10-CM | POA: Diagnosis not present

## 2023-05-04 DIAGNOSIS — N179 Acute kidney failure, unspecified: Secondary | ICD-10-CM | POA: Diagnosis not present

## 2023-05-04 DIAGNOSIS — I10 Essential (primary) hypertension: Secondary | ICD-10-CM

## 2023-05-04 LAB — BASIC METABOLIC PANEL
Anion gap: 7 (ref 5–15)
BUN: 66 mg/dL — ABNORMAL HIGH (ref 8–23)
CO2: 25 mmol/L (ref 22–32)
Calcium: 8.6 mg/dL — ABNORMAL LOW (ref 8.9–10.3)
Chloride: 101 mmol/L (ref 98–111)
Creatinine, Ser: 2.28 mg/dL — ABNORMAL HIGH (ref 0.44–1.00)
GFR, Estimated: 20 mL/min — ABNORMAL LOW (ref >60)
Glucose, Bld: 147 mg/dL — ABNORMAL HIGH (ref 70–99)
Potassium: 4.5 mmol/L (ref 3.5–5.1)
Sodium: 133 mmol/L — ABNORMAL LOW (ref 135–145)

## 2023-05-04 LAB — CBC
HCT: 36.9 % (ref 36.0–46.0)
Hemoglobin: 12.3 g/dL (ref 12.0–15.0)
MCH: 30.3 pg (ref 26.0–34.0)
MCHC: 33.3 g/dL (ref 30.0–36.0)
MCV: 90.9 fL (ref 80.0–100.0)
Platelets: 262 10*3/uL (ref 150–400)
RBC: 4.06 MIL/uL (ref 3.87–5.11)
RDW: 13.4 % (ref 11.5–15.5)
WBC: 12.7 10*3/uL — ABNORMAL HIGH (ref 4.0–10.5)
nRBC: 0 % (ref 0.0–0.2)

## 2023-05-04 LAB — GLUCOSE, CAPILLARY
Glucose-Capillary: 134 mg/dL — ABNORMAL HIGH (ref 70–99)
Glucose-Capillary: 128 mg/dL — ABNORMAL HIGH (ref 70–99)
Glucose-Capillary: 96 mg/dL (ref 70–99)
Glucose-Capillary: 202 mg/dL — ABNORMAL HIGH (ref 70–99)

## 2023-05-04 MED ORDER — SODIUM CHLORIDE 0.9 % IV BOLUS
250.0000 mL | Freq: Once | INTRAVENOUS | Status: AC
  Administered 2023-05-04: 250 mL via INTRAVENOUS

## 2023-05-04 MED ORDER — QUETIAPINE FUMARATE 25 MG PO TABS
12.5000 mg | ORAL_TABLET | Freq: Every day | ORAL | Status: DC
  Administered 2023-05-04: 12.5 mg via ORAL
  Filled 2023-05-04: qty 1

## 2023-05-04 MED ORDER — LOSARTAN POTASSIUM 25 MG PO TABS
25.0000 mg | ORAL_TABLET | Freq: Every day | ORAL | Status: DC
  Administered 2023-05-04: 25 mg via ORAL
  Filled 2023-05-04 (×2): qty 1

## 2023-05-04 NOTE — Plan of Care (Signed)
  Problem: Cardiac: Goal: Ability to achieve and maintain adequate cardiopulmonary perfusion will improve Outcome: Progressing   Problem: Elimination: Goal: Will not experience complications related to urinary retention Outcome: Progressing   Problem: Pain Managment: Goal: General experience of comfort will improve Outcome: Progressing   Problem: Skin Integrity: Goal: Risk for impaired skin integrity will decrease Outcome: Progressing

## 2023-05-04 NOTE — Assessment & Plan Note (Signed)
Continue metoprolol and Cardizem

## 2023-05-04 NOTE — Plan of Care (Signed)
  Problem: Fluid Volume: Goal: Hemodynamic stability will improve Outcome: Progressing   Problem: Clinical Measurements: Goal: Diagnostic test results will improve Outcome: Progressing   Problem: Education: Goal: Ability to safely manage health related needs after discharge will improve Outcome: Progressing

## 2023-05-04 NOTE — Progress Notes (Signed)
Progress Note   Patient: Leslie Parker:[id number] DOB: Mar 24, 1933 DOA: 04/30/2023     4 DOS: the patient was seen and examined on 05/04/2023   Brief hospital course: 89 year old female past medical history of chronic diastolic dysfunction, pacemaker, history of atrial fibrillation on chronic Eliquis, type 2 diabetes mellitus with stage III chronic kidney disease, COPD, recent left hip arthroplasty.  She has had worsening hip pain over the last week found to have an elevated blood pressure came in with shortness of breath and found to be hypoxic.  10/9.  Started on Solu-Medrol secondary to diffuse wheezing and COPD exacerbation.  Replacing IV magnesium. 10/10.  Creatinine up to 2.04.  Holding Lasix.  Continue Solu-Medrol today. 10/11.  Creatinine up to 2.47.  Will give fluid bolus today.  Hold steroids with confusion.  Has upper airway wheeze.  Continue nebulizer. 10/12.  Patient with acute delirium.  Creatinine improved to 2.28.  Spoke with granddaughter on the phone patient does live alone.   Assessment and Plan: * Acute delirium Trial low-dose Seroquel tonight.  Steroids stopped yesterday.  Likely from being in the hospital in an unfamiliar environment as we get older.  Steroids likely contributed.  Acute kidney injury superimposed on CKD (HCC) AKI on CKD stage IIIb.  Creatinine peaked at 2.47 with overdiuresis.  Hold Lasix.  Baseline creatinine around 1.57.  Today's creatinine 2.28.  Check BMP tomorrow.  Will give a fluid bolus today.  COPD with acute exacerbation (HCC) No steroids with acute delirium.  Continue albuterol nebulizers.  Patient takes Trelegy at home.  Continue Trelegy substitute here.  Continue empiric antibiotics Augmentin and Zithromax.  Acute respiratory failure with hypoxia (HCC) Patient was noted to be tachypneic and had room air pulse oximetry of 88%.  Patient currently off oxygen   Acute on chronic diastolic CHF (congestive heart failure) (HCC) Lasix held with  overdiuresis.  Last echocardiogram shows an EF of 60%  Paroxysmal atrial fibrillation (HCC) Continue amiodarone, metoprolol and Cardizem for rate control Continue Eliquis as primary prophylaxis for an acute stroke  Essential hypertension Blood pressure high today with acute delirium.  Continue metoprolol and Cardizem.  Add losartan.  Type II diabetes mellitus with renal manifestations (HCC) Patient with diabetes mellitus with complications of stage IIIb chronic kidney disease Hold glipizide Monitor renal function closely while on diuretic therapy Maintain consistent carbohydrate diet Last hemoglobin A1c is 5.8 Sugar this morning much better.   Left hip pain Seen by orthopedic and recommended PT  Overweight (BMI 25.0-29.9) BMI 27.71  Myocardial injury Likely secondary to acute respiratory failure, CHF and COPD exacerbation.  Hypokalemia Replaced  Hypomagnesemia Replaced       Subjective: Patient complained that the nursing staff was looking through her personal stuff and then they moved her outside and now into this room.  Patient's room was changed from yesterday closer to the nursing station.  Patient having acute delirium.  Admitted with COPD exacerbation  Physical Exam: Vitals:   05/04/23 0357 05/04/23 0429 05/04/23 0500 05/04/23 0747  BP: (!) 198/90 (!) 177/68  (!) 191/78  Pulse: 68 62  68  Resp: 20 20  (!) 22  Temp: (!) 97.5 F (36.4 C)   97.8 F (36.6 C)  TempSrc:      SpO2: 96% 91%  96%  Weight:   85.1 kg   Height:       Physical Exam HENT:     Head: Normocephalic.     Mouth/Throat:     Pharynx: No  oropharyngeal exudate.  Eyes:     General: Lids are normal.     Conjunctiva/sclera: Conjunctivae normal.  Cardiovascular:     Rate and Rhythm: Normal rate and regular rhythm.     Heart sounds: S1 normal and S2 normal. Murmur heard.     Systolic murmur is present with a grade of 2/6.  Pulmonary:     Breath sounds: No transmitted upper airway sounds.  Examination of the right-lower field reveals decreased breath sounds and rhonchi. Examination of the left-lower field reveals decreased breath sounds and rhonchi. Decreased breath sounds and rhonchi present. No wheezing or rales.  Abdominal:     Palpations: Abdomen is soft.     Tenderness: There is no abdominal tenderness.  Musculoskeletal:     Right lower leg: Swelling present.     Left lower leg: Swelling present.  Skin:    General: Skin is warm.     Findings: No rash.  Neurological:     Mental Status: She is alert.  Psychiatric:        Thought Content: Thought content is paranoid.     Data Reviewed: Creatinine 2.28, white blood cell count 12.7, hemoglobin 12.3, platelet count 262  Family Communication: Spoke with granddaughter on the phone  Disposition: Status is: Inpatient Remains inpatient appropriate because: With acute delirium and acute kidney injury we will watch another day in the hospital and reassess tomorrow.  Trial of Seroquel at night.  Planned Discharge Destination: Home with Home Health    Time spent: 28 minutes  Author: Alford Highland, MD 05/04/2023 12:22 PM  For on call review [email or web address].

## 2023-05-04 NOTE — Progress Notes (Signed)
[facility] Cardiology  SUBJECTIVE: Patient laying in bed, denies shortness of breath   Vitals:   05/04/23 0357 05/04/23 0429 05/04/23 0500 05/04/23 0747  BP: (!) 198/90 (!) 177/68  (!) 191/78  Pulse: 68 62  68  Resp: 20 20  (!) 22  Temp: (!) 97.5 F (36.4 C)   97.8 F (36.6 C)  TempSrc:      SpO2: 96% 91%  96%  Weight:   85.1 kg   Height:        No intake or output data in the 24 hours ending 05/04/23 1150    PHYSICAL EXAM  General: Well developed, well nourished, in no acute distress HEENT:  Normocephalic and atramatic Neck:  No JVD.  Lungs: Clear bilaterally to auscultation and percussion. Heart: HRRR . Normal S1 and S2 without gallops or murmurs.  Abdomen: Bowel sounds are positive, abdomen soft and non-tender  Msk:  Back normal, normal gait. Normal strength and tone for age. Extremities: No clubbing, cyanosis or edema.   Neuro: Alert and oriented X 3. Psych:  Good affect, responds appropriately   LABS: Basic Metabolic Panel: Recent Labs    05/02/23 0512 05/03/23 0349 05/04/23 0333  NA 132* 132* 133*  K 4.2 4.8 4.5  CL 98 99 101  CO2 23 23 25   GLUCOSE 364* 265* 147*  BUN 37* 55* 66*  CREATININE 2.04* 2.47* 2.28*  CALCIUM 8.0* 8.2* 8.6*  MG 1.9  --   --    Liver Function Tests: No results for input(s): "AST", "ALT", "ALKPHOS", "BILITOT", "PROT", "ALBUMIN" in the last 72 hours. No results for input(s): "LIPASE", "AMYLASE" in the last 72 hours. CBC: Recent Labs    05/03/23 0349 05/04/23 0333  WBC 11.7* 12.7*  HGB 11.1* 12.3  HCT 33.8* 36.9  MCV 91.6 90.9  PLT 203 262   Cardiac Enzymes: No results for input(s): "CKTOTAL", "CKMB", "CKMBINDEX", "TROPONINI" in the last 72 hours. BNP: Invalid input(s): "POCBNP" D-Dimer: No results for input(s): "DDIMER" in the last 72 hours. Hemoglobin A1C: No results for input(s): "HGBA1C" in the last 72 hours. Fasting Lipid Panel: No results for input(s): "CHOL", "HDL", "LDLCALC", "TRIG", "CHOLHDL", "LDLDIRECT" in the  last 72 hours. Thyroid Function Tests: No results for input(s): "TSH", "T4TOTAL", "T3FREE", "THYROIDAB" in the last 72 hours.  Invalid input(s): "FREET3" Anemia Panel: No results for input(s): "VITAMINB12", "FOLATE", "FERRITIN", "TIBC", "IRON", "RETICCTPCT" in the last 72 hours.  No results found.   Echo LVEF 60-65% 02/13/2023  TELEMETRY: Sinus rhythm 68 bpm:  ASSESSMENT AND PLAN:  Principal Problem:   Acute kidney injury superimposed on CKD (HCC) Active Problems:   Acute respiratory failure with hypoxia (HCC)   Hypomagnesemia   COPD with acute exacerbation (HCC)   Acute on chronic diastolic CHF (congestive heart failure) (HCC)   Paroxysmal atrial fibrillation (HCC)   Type II diabetes mellitus with renal manifestations (HCC)   Hypokalemia   Myocardial injury   Overweight (BMI 25.0-29.9)   Left hip pain    1.  Acute on chronic HFpEF, clinically improved after diuresis, currently not requiring minimal oxygen, oxygen saturation 96% on room air, appears euvolemic 2.  Essential hypertension, blood pressure elevated on current BP medications (Cardizem CD, metoprolol  tartrate) 3.  Paroxysmal atrial fibrillation, low-dose Eliquis for stroke prevention, amiodarone and metoprolol succinate for rate and rhythm control 4.  AKI with underlying CKD stage IIIb, BUN and creatinine 66 and 2.28  Recommendations  1.  Agree with current therapy 2.  Continue to hold diuretics at  this time 3.  Continue Eliquis low-dose for stroke prevention 4.  Continue amiodarone and metoprolol succinate for rate and rhythm control 5.  Resume home diuretic regimen once renal status stabilizes 6.  Follow-up Dr. Juliann Pares as outpatient  Sign off for now, please call with any questions   Marcina Millard, MD, PhD, [facility] 05/04/2023 11:50 AM

## 2023-05-04 NOTE — Assessment & Plan Note (Signed)
Trial low-dose Seroquel tonight.  Steroids stopped yesterday.  Likely from being in the hospital in an unfamiliar environment as we get older.  Steroids likely contributed.

## 2023-05-05 DIAGNOSIS — N179 Acute kidney failure, unspecified: Secondary | ICD-10-CM | POA: Diagnosis not present

## 2023-05-05 DIAGNOSIS — J441 Chronic obstructive pulmonary disease with (acute) exacerbation: Secondary | ICD-10-CM | POA: Diagnosis not present

## 2023-05-05 DIAGNOSIS — J9601 Acute respiratory failure with hypoxia: Secondary | ICD-10-CM | POA: Diagnosis not present

## 2023-05-05 DIAGNOSIS — R410 Disorientation, unspecified: Secondary | ICD-10-CM | POA: Diagnosis not present

## 2023-05-05 DIAGNOSIS — E871 Hypo-osmolality and hyponatremia: Secondary | ICD-10-CM | POA: Insufficient documentation

## 2023-05-05 LAB — BASIC METABOLIC PANEL
Anion gap: 7 (ref 5–15)
BUN: 64 mg/dL — ABNORMAL HIGH (ref 8–23)
CO2: 23 mmol/L (ref 22–32)
Calcium: 8 mg/dL — ABNORMAL LOW (ref 8.9–10.3)
Chloride: 104 mmol/L (ref 98–111)
Creatinine, Ser: 2.43 mg/dL — ABNORMAL HIGH (ref 0.44–1.00)
GFR, Estimated: 19 mL/min — ABNORMAL LOW (ref >60)
Glucose, Bld: 162 mg/dL — ABNORMAL HIGH (ref 70–99)
Potassium: 4.2 mmol/L (ref 3.5–5.1)
Sodium: 134 mmol/L — ABNORMAL LOW (ref 135–145)

## 2023-05-05 LAB — GLUCOSE, CAPILLARY
Glucose-Capillary: 139 mg/dL — ABNORMAL HIGH (ref 70–99)
Glucose-Capillary: 174 mg/dL — ABNORMAL HIGH (ref 70–99)
Glucose-Capillary: 140 mg/dL — ABNORMAL HIGH (ref 70–99)
Glucose-Capillary: 138 mg/dL — ABNORMAL HIGH (ref 70–99)
Glucose-Capillary: 197 mg/dL — ABNORMAL HIGH (ref 70–99)

## 2023-05-05 MED ORDER — SODIUM CHLORIDE 0.9 % IV BOLUS
500.0000 mL | Freq: Once | INTRAVENOUS | Status: AC
  Administered 2023-05-05: 500 mL via INTRAVENOUS

## 2023-05-05 MED ORDER — QUETIAPINE FUMARATE 25 MG PO TABS
12.5000 mg | ORAL_TABLET | Freq: Every evening | ORAL | Status: DC | PRN
  Administered 2023-05-06 (×2): 12.5 mg via ORAL
  Filled 2023-05-05 (×2): qty 1

## 2023-05-05 NOTE — Progress Notes (Signed)
Progress Note   Patient: Leslie Parker HQI:[id number] DOB: August 28, 1932 DOA: 04/30/2023     5 DOS: the patient was seen and examined on 05/05/2023   Brief hospital course: 89 year old female past medical history of chronic diastolic dysfunction, pacemaker, history of atrial fibrillation on chronic Eliquis, type 2 diabetes mellitus with stage III chronic kidney disease, COPD, recent left hip arthroplasty.  She has had worsening hip pain over the last week found to have an elevated blood pressure came in with shortness of breath and found to be hypoxic.  10/9.  Started on Solu-Medrol secondary to diffuse wheezing and COPD exacerbation.  Replacing IV magnesium. 10/10.  Creatinine up to 2.04.  Holding Lasix.  Continue Solu-Medrol today. 10/11.  Creatinine up to 2.47.  Will give fluid bolus today.  Hold steroids with confusion.  Has upper airway wheeze.  Continue nebulizer. 10/12.  Patient with acute delirium.  Creatinine improved to 2.28.  Spoke with granddaughter on the phone patient does live alone.   Assessment and Plan: * Acute delirium Improved.  Needed Seroquel as needed for agitation or insomnia.  Steroids stopped the other day.  Likely from being in the hospital in an unfamiliar environment as we get older.  Steroids likely contributed.  Acute kidney injury superimposed on CKD (HCC) AKI on CKD stage IIIb.  Creatinine 2.43 today.  Will give another fluid bolus today  COPD with acute exacerbation (HCC) No steroids with acute delirium.  Continue albuterol nebulizers.  Patient takes Trelegy at home.  Continue Trelegy substitute here.  Continue empiric antibiotics Augmentin and Zithromax.  Acute respiratory failure with hypoxia (HCC) Patient was noted to be tachypneic and had room air pulse oximetry of 88%.  Patient currently off oxygen   Acute on chronic diastolic CHF (congestive heart failure) (HCC) Lasix held with overdiuresis.  Last echocardiogram shows an EF of 60%  Paroxysmal atrial  fibrillation (HCC) Continue amiodarone, metoprolol and Cardizem for rate control Continue Eliquis as primary prophylaxis for an acute stroke  Essential hypertension Blood pressure high today with acute delirium.  Continue metoprolol and Cardizem.  Type II diabetes mellitus with renal manifestations (HCC) Patient with diabetes mellitus with complications of stage IIIb chronic kidney disease Hold glipizide Last hemoglobin A1c is 5.8    Hyponatremia Last sodium 1 point less than normal range.  Left hip pain Seen by orthopedic and recommended PT  Overweight (BMI 25.0-29.9) BMI 27.71  Myocardial injury Likely secondary to acute respiratory failure, CHF and COPD exacerbation.  Hypokalemia Replaced  Hypomagnesemia Replaced        Subjective: Patient slept last night and a little groggy this morning with Seroquel dosing last night.  Able to have a better conversation today than yesterday.  Not paranoid.  Physical Exam: Vitals:   05/05/23 0351 05/05/23 0749 05/05/23 0839 05/05/23 1153  BP: (!) 145/64  132/82 110/67  Pulse: 64  72 70  Resp: 18  (!) 22 18  Temp: 97.7 F (36.5 C)  98.1 F (36.7 C) 97.9 F (36.6 C)  TempSrc: Axillary     SpO2: 94% 92% 96% 99%  Weight:      Height:       Physical Exam HENT:     Head: Normocephalic.     Mouth/Throat:     Pharynx: No oropharyngeal exudate.  Eyes:     General: Lids are normal.     Conjunctiva/sclera: Conjunctivae normal.  Cardiovascular:     Rate and Rhythm: Normal rate and regular rhythm.  Heart sounds: S1 normal and S2 normal. Murmur heard.     Systolic murmur is present with a grade of 2/6.  Pulmonary:     Breath sounds: No transmitted upper airway sounds. Examination of the right-lower field reveals decreased breath sounds and wheezing. Examination of the left-lower field reveals decreased breath sounds and wheezing. Decreased breath sounds and wheezing present. No rhonchi or rales.  Abdominal:      Palpations: Abdomen is soft.     Tenderness: There is no abdominal tenderness.  Musculoskeletal:     Right lower leg: Swelling present.     Left lower leg: Swelling present.  Skin:    General: Skin is warm.     Findings: No rash.  Neurological:     Mental Status: She is alert.     Comments: Able to follow commands and straight leg raise.     Data Reviewed: Creatinine 2.43, sodium 134  Family Communication: Left message for granddaughter  Disposition: Status is: Inpatient Remains inpatient appropriate because: Creatinine still elevated today will give another fluid bolus.  Planned Discharge Destination: Home with home health    Time spent: 28 minutes  Author: Alford Highland, MD 05/05/2023 1:05 PM  For on call review [email or web address].

## 2023-05-05 NOTE — Assessment & Plan Note (Signed)
Last sodium 1 point less than normal range.

## 2023-05-06 ENCOUNTER — Inpatient Hospital Stay: Payer: Medicare HMO

## 2023-05-06 DIAGNOSIS — J441 Chronic obstructive pulmonary disease with (acute) exacerbation: Secondary | ICD-10-CM | POA: Diagnosis not present

## 2023-05-06 DIAGNOSIS — R410 Disorientation, unspecified: Secondary | ICD-10-CM | POA: Diagnosis not present

## 2023-05-06 DIAGNOSIS — J9601 Acute respiratory failure with hypoxia: Secondary | ICD-10-CM | POA: Diagnosis not present

## 2023-05-06 DIAGNOSIS — N179 Acute kidney failure, unspecified: Secondary | ICD-10-CM | POA: Diagnosis not present

## 2023-05-06 LAB — BASIC METABOLIC PANEL
Anion gap: 9 (ref 5–15)
BUN: 76 mg/dL — ABNORMAL HIGH (ref 8–23)
CO2: 21 mmol/L — ABNORMAL LOW (ref 22–32)
Calcium: 7.9 mg/dL — ABNORMAL LOW (ref 8.9–10.3)
Chloride: 105 mmol/L (ref 98–111)
Creatinine, Ser: 2.45 mg/dL — ABNORMAL HIGH (ref 0.44–1.00)
GFR, Estimated: 18 mL/min — ABNORMAL LOW (ref >60)
Glucose, Bld: 204 mg/dL — ABNORMAL HIGH (ref 70–99)
Potassium: 4.2 mmol/L (ref 3.5–5.1)
Sodium: 135 mmol/L (ref 135–145)

## 2023-05-06 LAB — GLUCOSE, CAPILLARY
Glucose-Capillary: 190 mg/dL — ABNORMAL HIGH (ref 70–99)
Glucose-Capillary: 159 mg/dL — ABNORMAL HIGH (ref 70–99)
Glucose-Capillary: 217 mg/dL — ABNORMAL HIGH (ref 70–99)
Glucose-Capillary: 155 mg/dL — ABNORMAL HIGH (ref 70–99)

## 2023-05-06 MED ORDER — SODIUM CHLORIDE 0.9 % IV BOLUS
500.0000 mL | Freq: Once | INTRAVENOUS | Status: AC
  Administered 2023-05-06: 500 mL via INTRAVENOUS

## 2023-05-06 MED ORDER — ALBUTEROL SULFATE (2.5 MG/3ML) 0.083% IN NEBU
2.5000 mg | INHALATION_SOLUTION | Freq: Four times a day (QID) | RESPIRATORY_TRACT | Status: DC | PRN
  Administered 2023-05-06 – 2023-05-08 (×3): 2.5 mg via RESPIRATORY_TRACT
  Filled 2023-05-06 (×2): qty 3

## 2023-05-06 NOTE — TOC Progression Note (Signed)
Transition of Care [facility]) - Progression Note    Patient Details  Name: Leslie Parker MRN: [id number] Date of Birth: 1932-10-31  [facility] CM/SW Contact  Darolyn Rua, [place] Phone Number: 05/06/2023, 10:31 AM  Clinical Narrative:        10/14:  Patient has [facility] PT OT and RN services. TOC continues to follow for needs.      10/9:  Patient with high readmission score, per chart review patient noted to have recently dc from hospital :   Admitted NWG:NFAO hip pain Admitted from: Home alone. Lives in a senior apartment Pharmacy: [facility]  Current home health/prior home health/DME:[facility] HH: active with [facility] per [place] - RN PT and OT Transportation: granddaughter takes her appointments and will transport her home at discharge.   Please consult TOC should additional discharge needs arise   Barriers to Discharge: Continued Medical Work up      Barriers to Discharge: Continued Medical Work up  Expected Discharge Plan and Services       Living arrangements for the past 2 months: Single Family Home                                       Social Determinants of Health (SDOH) Interventions SDOH Screenings   Food Insecurity: No Food Insecurity (05/01/2023)  Housing: Low Risk  (05/01/2023)  Transportation Needs: No Transportation Needs (05/01/2023)  Utilities: Not At Risk (05/01/2023)  Tobacco Use: Low Risk  (05/01/2023)    Readmission Risk Interventions    03/21/2023    1:12 PM 08/27/2022   12:05 PM  Readmission Risk Prevention Plan  Transportation Screening Complete Complete  PCP or Specialist Appt within 3-5 Days Complete Complete  HRI or Home Care Consult Complete Complete  Social Work Consult for Recovery Care Planning/Counseling Complete   Palliative Care Screening Not Applicable   Medication Review Oceanographer) Complete Complete

## 2023-05-06 NOTE — Progress Notes (Addendum)
Progress Note   Patient: Leslie Parker BJY:[id number] DOB: 09-25-1932 DOA: 04/30/2023     6 DOS: the patient was seen and examined on 05/06/2023   Brief hospital course: 89 year old female past medical history of chronic diastolic dysfunction, pacemaker, history of atrial fibrillation on chronic Eliquis, type 2 diabetes mellitus with stage III chronic kidney disease, COPD, recent left hip arthroplasty.  She has had worsening hip pain over the last week found to have an elevated blood pressure came in with shortness of breath and found to be hypoxic.  10/9.  Started on Solu-Medrol secondary to diffuse wheezing and COPD exacerbation.  Replacing IV magnesium. 10/10.  Creatinine up to 2.04.  Holding Lasix.  Continue Solu-Medrol today. 10/11.  Creatinine up to 2.47.  Will give fluid bolus today.  Hold steroids with confusion.  Has upper airway wheeze.  Continue nebulizer. 10/12.  Patient with acute delirium.  Creatinine improved to 2.28.  Spoke with granddaughter on the phone patient does live alone. 10/13.  Creatinine 2.43.  Fluid bolus given. 10/14 creatinine 2.45.  Will give 2 fluid boluses today.   Assessment and Plan: * Acute kidney injury superimposed on CKD (HCC) AKI on CKD stage IIIb.  Creatinine 2.45 today.  Will give a fluid bolus this morning and again this afternoon.  Will check a bladder scan and renal ultrasound.  Acute delirium Improved.  Needed Seroquel early morning for insomnia.    COPD with acute exacerbation (HCC) No steroids with acute delirium.  Continue albuterol nebulizers.  Patient takes Trelegy at home.  Continue Trelegy substitute here.  Continue empiric antibiotics Augmentin and Zithromax.  Acute respiratory failure with hypoxia (HCC) Patient was noted to be tachypneic and had room air pulse oximetry of 88%.  Patient currently off oxygen   Acute on chronic diastolic CHF (congestive heart failure) (HCC) Lasix held with overdiuresis.  Last echocardiogram shows an EF  of 60%  Paroxysmal atrial fibrillation (HCC) Continue amiodarone, metoprolol and Cardizem for rate control Continue Eliquis as primary prophylaxis for an acute stroke  Essential hypertension Continue metoprolol and Cardizem  Type II diabetes mellitus with renal manifestations (HCC) Patient with diabetes mellitus with complications of stage IIIb chronic kidney disease Hold glipizide Last hemoglobin A1c is 5.8    Hyponatremia Normal range.  Left hip pain Seen by orthopedic and recommended PT  Overweight (BMI 25.0-29.9) BMI 27.71  Myocardial injury Likely secondary to acute respiratory failure, CHF and COPD exacerbation.  Hypokalemia Replaced  Hypomagnesemia Replaced       Subjective: Patient stated she slept okay last night.  Needed Seroquel around 1 AM.  Mental status improved from 2 days ago.  Kidney function still impaired.  Patient states she is eating and drinking okay.  Physical Exam: Vitals:   05/06/23 0743 05/06/23 0940 05/06/23 1121 05/06/23 1504  BP: (!) 148/84 (!) 162/84  (!) 144/73  Pulse: 72 71  70  Resp: 20   19  Temp: 98.2 F (36.8 C)  97.8 F (36.6 C) 97.7 F (36.5 C)  TempSrc: Oral     SpO2: 97% 97%  99%  Weight:      Height:       Physical Exam HENT:     Head: Normocephalic.     Mouth/Throat:     Pharynx: No oropharyngeal exudate.  Eyes:     General: Lids are normal.     Conjunctiva/sclera: Conjunctivae normal.  Cardiovascular:     Rate and Rhythm: Normal rate and regular rhythm.  Heart sounds: S1 normal and S2 normal. Murmur heard.     Systolic murmur is present with a grade of 2/6.  Pulmonary:     Breath sounds: No transmitted upper airway sounds. Examination of the right-lower field reveals decreased breath sounds. Examination of the left-lower field reveals decreased breath sounds. Decreased breath sounds present. No wheezing, rhonchi or rales.  Abdominal:     Palpations: Abdomen is soft.     Tenderness: There is no  abdominal tenderness.  Musculoskeletal:     Right lower leg: Swelling present.     Left lower leg: Swelling present.  Skin:    General: Skin is warm.     Findings: No rash.  Neurological:     Mental Status: She is alert.     Comments: Able to hold the better conversation than a couple days ago.     Data Reviewed: Creatinine 2.45  Family Communication: Left message for granddaughter  Disposition: Status is: Inpatient Remains inpatient appropriate because: Creatinine still impaired.  Will give a fluid bolus this morning and again this afternoon.  Check bladder scan and renal ultrasound.  Planned Discharge Destination: Home with Home Health    Time spent: 27 minutes  Author: Alford Highland, MD 05/06/2023 4:00 PM  For on call review [email or web address].

## 2023-05-06 NOTE — Progress Notes (Signed)
Physical Therapy Treatment Patient Details Name: Leslie Parker MRN: [id number] DOB: 06-Nov-1932 Today's Date: 05/06/2023   History of Present Illness Pt is an 89 y/o female presented to ED on 04/30/23 for L hip cyst and pain x 1 week as well as SOB and LE edema. Admitted for acute hypoxic respiratory failure and acute exacerbation of CHF. PMH: sick sinus syndrome s/p pacemaker placement 4/25, HTN, Afib, DM, CKD stage 3, COPD    PT Comments  Pt was pleasant and motivated to participate during the session and put forth good effort throughout. Pt required no physical assistance during the session but did require extra time and effort with most tasks along with cuing for general sequencing.  Pt was generally steady with all standing activities with the RW with no overt LOB and no adverse symptoms noted during the session.  Pt will benefit from continued PT services upon discharge to safely address deficits listed in patient problem list for decreased caregiver assistance and eventual return to PLOF.      If plan is discharge home, recommend the following: A little help with walking and/or transfers;A little help with bathing/dressing/bathroom;Assist for transportation   Can travel by private vehicle        Equipment Recommendations  Rolling walker (2 wheels)    Recommendations for Other Services       Precautions / Restrictions Precautions Precautions: Fall Restrictions Weight Bearing Restrictions: No     Mobility  Bed Mobility Overal bed mobility: Needs Assistance       Supine to sit: Supervision     General bed mobility comments: Min extra time, effort, and use of bed rail    Transfers Overall transfer level: Needs assistance Equipment used: Rolling walker (2 wheels) Transfers: Sit to/from Stand Sit to Stand: Supervision           General transfer comment: Min verbal cues for sequencing/hand placement    Ambulation/Gait Ambulation/Gait assistance: Supervision Gait  Distance (Feet): 40 Feet Assistive device: Rolling walker (2 wheels) Gait Pattern/deviations: Step-through pattern, Decreased step length - right, Decreased step length - left, Trunk flexed Gait velocity: decreased     General Gait Details: Slow cadence but steady with no overt LOB; mod verbal cues for amb closer to the RW with upright posture   Stairs             Wheelchair Mobility     Tilt Bed    Modified Rankin (Stroke Patients Only)       Balance Overall balance assessment: Needs assistance Sitting-balance support: Feet supported Sitting balance-[name]: Normal     Standing balance support: Bilateral upper extremity supported, During functional activity Standing balance-[name]: Good                              Cognition Arousal: Alert Behavior During Therapy: WFL for tasks assessed/performed Overall Cognitive Status: Within Functional Limits for tasks assessed                                          Exercises Total Joint Exercises Ankle Circles/Pumps: AROM, Strengthening, Both, 10 reps, 5 reps Quad Sets: Strengthening, Both, 10 reps, 5 reps Long Arc Quad: Strengthening, Both, 10 reps, 5 reps Knee Flexion: Strengthening, Both, 10 reps, 5 reps    General Comments        Pertinent  Vitals/Pain Pain Assessment Pain Assessment: No/denies pain    Home Living                          Prior Function            PT Goals (current goals can now be found in the care plan section) Progress towards PT goals: Progressing toward goals    Frequency    Min 1X/week      PT Plan      Co-evaluation              AM-PAC PT "6 Clicks" Mobility   Outcome Measure  Help needed turning from your back to your side while in a flat bed without using bedrails?: A Little Help needed moving from lying on your back to sitting on the side of a flat bed without using bedrails?: A Little Help needed moving to  and from a bed to a chair (including a wheelchair)?: A Little Help needed standing up from a chair using your arms (e.g., wheelchair or bedside chair)?: A Little Help needed to walk in hospital room?: A Little Help needed climbing 3-5 steps with a railing? : A Little 6 Click Score: 18    End of Session Equipment Utilized During Treatment: Gait belt Activity Tolerance: Patient tolerated treatment well Patient left: in chair;with call bell/phone within reach;with chair alarm set;with nursing/sitter in room Nurse Communication: Mobility status PT Visit Diagnosis: Muscle weakness (generalized) (M62.81);Difficulty in walking, not elsewhere classified (R26.2);Pain Pain - Right/Left: Left Pain - part of body: Hip     Time: 1006-1029 PT Time Calculation (min) (ACUTE ONLY): 23 min  Charges:    $Gait Training: 8-22 mins $Therapeutic Exercise: 8-22 mins PT General Charges $$ ACUTE PT VISIT: 1 Visit                    D. Scott Blakeley Margraf PT, DPT 05/06/23, 11:26 AM

## 2023-05-07 DIAGNOSIS — J441 Chronic obstructive pulmonary disease with (acute) exacerbation: Secondary | ICD-10-CM | POA: Diagnosis not present

## 2023-05-07 DIAGNOSIS — J9601 Acute respiratory failure with hypoxia: Secondary | ICD-10-CM | POA: Diagnosis not present

## 2023-05-07 DIAGNOSIS — R410 Disorientation, unspecified: Secondary | ICD-10-CM | POA: Diagnosis not present

## 2023-05-07 DIAGNOSIS — N179 Acute kidney failure, unspecified: Secondary | ICD-10-CM | POA: Diagnosis not present

## 2023-05-07 LAB — BASIC METABOLIC PANEL
Anion gap: 8 (ref 5–15)
BUN: 71 mg/dL — ABNORMAL HIGH (ref 8–23)
CO2: 21 mmol/L — ABNORMAL LOW (ref 22–32)
Calcium: 7.7 mg/dL — ABNORMAL LOW (ref 8.9–10.3)
Chloride: 105 mmol/L (ref 98–111)
Creatinine, Ser: 2.2 mg/dL — ABNORMAL HIGH (ref 0.44–1.00)
GFR, Estimated: 21 mL/min — ABNORMAL LOW (ref >60)
Glucose, Bld: 182 mg/dL — ABNORMAL HIGH (ref 70–99)
Potassium: 3.9 mmol/L (ref 3.5–5.1)
Sodium: 134 mmol/L — ABNORMAL LOW (ref 135–145)

## 2023-05-07 LAB — GLUCOSE, CAPILLARY
Glucose-Capillary: 149 mg/dL — ABNORMAL HIGH (ref 70–99)
Glucose-Capillary: 163 mg/dL — ABNORMAL HIGH (ref 70–99)
Glucose-Capillary: 223 mg/dL — ABNORMAL HIGH (ref 70–99)
Glucose-Capillary: 190 mg/dL — ABNORMAL HIGH (ref 70–99)

## 2023-05-07 LAB — HEMOGLOBIN: Hemoglobin: 10.6 g/dL — ABNORMAL LOW (ref 12.0–15.0)

## 2023-05-07 MED ORDER — SODIUM CHLORIDE 0.9 % IV SOLN: INTRAVENOUS | Status: DC

## 2023-05-07 MED ORDER — POLYETHYLENE GLYCOL 3350 17 G PO PACK
17.0000 g | PACK | Freq: Two times a day (BID) | ORAL | Status: DC
  Filled 2023-05-07 (×4): qty 1

## 2023-05-07 NOTE — Progress Notes (Signed)
Progress Note   Patient: Leslie Parker [id number]:[id number] DOB: July 04, 1933 DOA: 04/30/2023     7 DOS: the patient was seen and examined on 05/07/2023   Brief hospital course: 89 year old female past medical history of chronic diastolic dysfunction, pacemaker, history of atrial fibrillation on chronic Eliquis, type 2 diabetes mellitus with stage III chronic kidney disease, COPD, recent left hip arthroplasty.  She has had worsening hip pain over the last week found to have an elevated blood pressure came in with shortness of breath and found to be hypoxic.  10/9.  Started on Solu-Medrol secondary to diffuse wheezing and COPD exacerbation.  Replacing IV magnesium. 10/10.  Creatinine up to 2.04.  Holding Lasix.  Continue Solu-Medrol today. 10/11.  Creatinine up to 2.47.  Will give fluid bolus today.  Hold steroids with confusion.  Has upper airway wheeze.  Continue nebulizer. 10/12.  Patient with acute delirium.  Creatinine improved to 2.28.  Spoke with granddaughter on the phone patient does live alone. 10/13.  Creatinine 2.43.  Fluid bolus given. 10/14 creatinine 2.45.  Will give 2 fluid boluses today.  Renal ultrasound showing multiple bilateral renal cysts. 10/15.  Creatinine down to 2.2.  Gentle IV fluid hydration.  Delirium has cleared.  Assessment and Plan: * Acute kidney injury superimposed on CKD (HCC) AKI on CKD stage IIIb.  Creatinine 2.45 yesterday and down to 2.2 today.  Will give continuous fluids.  Bladder scan negative.  Renal ultrasound shows bilateral renal cysts but otherwise negative.  Acute delirium Improved.  As needed Seroquel for insomnia/delirium.    COPD with acute exacerbation (HCC) No steroids with acute delirium.  Continue albuterol nebulizers.  Patient takes Trelegy at home.  Continue Trelegy substitute here.  Completed antibiotics.  Acute respiratory failure with hypoxia (HCC) Patient was noted to be tachypneic and had room air pulse oximetry of 88%.  Patient currently  off oxygen   Acute on chronic diastolic CHF (congestive heart failure) (HCC) Lasix held with overdiuresis.  Last echocardiogram shows an EF of 60%  Paroxysmal atrial fibrillation (HCC) Continue amiodarone, metoprolol and Cardizem for rate control Continue Eliquis as primary prophylaxis for an acute stroke  Essential hypertension Continue metoprolol and Cardizem  Type II diabetes mellitus with renal manifestations (HCC) Patient with diabetes mellitus with complications of stage IIIb chronic kidney disease Hold glipizide Last hemoglobin A1c is 5.8    Hyponatremia 1 point less than the normal range  Left hip pain Seen by orthopedic and recommended PT  Overweight (BMI 25.0-29.9) BMI 27.71  Myocardial injury Likely secondary to acute respiratory failure, CHF and COPD exacerbation.  Hypokalemia Replaced  Hypomagnesemia Replaced      Subjective: Patient feels okay.  Answers questions appropriately.  Slept well last night.  States she is trying to eat and drink.  Initially admitted with acute respiratory failure and COPD exacerbation.  Physical Exam: Vitals:   05/06/23 2304 05/07/23 0423 05/07/23 0801 05/07/23 1223  BP: 128/84 (!) 145/75 (!) 117/51 123/70  Pulse: (!) 50 70 70 70  Resp: 20 20 17 16   Temp: 98.4 F (36.9 C) 98 F (36.7 C) 98 F (36.7 C) 98 F (36.7 C)  TempSrc:   Axillary   SpO2: 98% 96% 90% 97%  Weight:      Height:       Physical Exam HENT:     Head: Normocephalic.     Mouth/Throat:     Pharynx: No oropharyngeal exudate.  Eyes:     General: Lids are normal.  Conjunctiva/sclera: Conjunctivae normal.  Cardiovascular:     Rate and Rhythm: Normal rate and regular rhythm.     Heart sounds: S1 normal and S2 normal. Murmur heard.     Systolic murmur is present with a grade of 2/6.  Pulmonary:     Breath sounds: No transmitted upper airway sounds. Examination of the right-lower field reveals decreased breath sounds. Examination of the  left-lower field reveals decreased breath sounds. Decreased breath sounds present. No wheezing, rhonchi or rales.  Abdominal:     Palpations: Abdomen is soft.     Tenderness: There is no abdominal tenderness.  Musculoskeletal:     Right lower leg: Swelling present.     Left lower leg: Swelling present.  Skin:    General: Skin is warm.     Findings: No rash.  Neurological:     Mental Status: She is alert.     Comments: Able to hold the better conversation than a couple days ago.     Data Reviewed: Sodium 134, creatinine 2.2, hemoglobin 10.6  Family Communication: Updated patient's daughter on the phone  Disposition: Status is: Inpatient Remains inpatient appropriate because: Continuous IV fluids started with creatinine still elevated at 2.2  Planned Discharge Destination: Home with Home Health    Time spent: 28 minutes  Author: Alford Highland, MD 05/07/2023 12:57 PM  For on call review [email or web address].

## 2023-05-07 NOTE — Progress Notes (Signed)
Occupational Therapy Treatment Patient Details Name: Leslie Parker MRN: [id number] DOB: 1933/04/03 Today's Date: 05/07/2023   History of present illness Pt is an 89 y/o female presented to ED on 04/30/23 for L hip cyst and pain x 1 week as well as SOB and LE edema. Admitted for acute hypoxic respiratory failure and acute exacerbation of CHF. PMH: sick sinus syndrome s/p pacemaker placement 4/25, HTN, Afib, DM, CKD stage 3, COPD   OT comments  Leslie Parker was seen for OT treatment on this date. Upon arrival to room pt reclined in chair, agreeable to tx. Pt requires SUPERVISION + RW for toilet t/f and hand washing, MOD A for pericare standing. Reports soreness in L hip, requests to return to bed. Left with all needs. Pt making good progress toward goals, will continue to follow POC. Discharge recommendation remains appropriate.        If plan is discharge home, recommend the following:  A little help with walking and/or transfers;A little help with bathing/dressing/bathroom;Help with stairs or ramp for entrance   Equipment Recommendations  BSC/3in1    Recommendations for Other Services      Precautions / Restrictions Precautions Precautions: Fall Restrictions Weight Bearing Restrictions: No       Mobility Bed Mobility Overal bed mobility: Needs Assistance Bed Mobility: Sit to Supine       Sit to supine: Contact guard assist        Transfers Overall transfer level: Needs assistance Equipment used: Rolling walker (2 wheels) Transfers: Sit to/from Stand Sit to Stand: Supervision                 Balance Overall balance assessment: Needs assistance Sitting-balance support: Feet supported Sitting balance-[name]: Normal     Standing balance support: No upper extremity supported, During functional activity Standing balance-[name]: Fair                             ADL either performed or assessed with clinical judgement   ADL Overall ADL's :  Needs assistance/impaired                                       General ADL Comments: SUPERVISION + RW for toilet t/f and hand washing, MOD A for pericare standing      Cognition Arousal: Alert Behavior During Therapy: WFL for tasks assessed/performed Overall Cognitive Status: Within Functional Limits for tasks assessed                                                     Pertinent Vitals/ Pain       Pain Assessment Pain Assessment: Faces Faces Pain Scale: Hurts little more Pain Location: L hip Pain Descriptors / Indicators: Discomfort Pain Intervention(s): Limited activity within patient's tolerance, Repositioned   Frequency  Min 1X/week        Progress Toward Goals  OT Goals(current goals can now be found in the care plan section)  Progress towards OT goals: Progressing toward goals  Acute Rehab OT Goals Patient Stated Goal: to go home OT Goal Formulation: With patient Time For Goal Achievement: 05/15/23 Potential to Achieve Goals: Good ADL Goals Pt Will Perform Grooming: with modified independence;standing Pt  Will Perform Lower Body Dressing: with modified independence;sit to/from stand Pt Will Transfer to Toilet: with modified independence;ambulating;regular height toilet  Plan      Co-evaluation                 AM-PAC OT "6 Clicks" Daily Activity     Outcome Measure   Help from another person eating meals?: None Help from another person taking care of personal grooming?: A Little Help from another person toileting, which includes using toliet, bedpan, or urinal?: A Little Help from another person bathing (including washing, rinsing, drying)?: A Little Help from another person to put on and taking off regular upper body clothing?: None Help from another person to put on and taking off regular lower body clothing?: A Little 6 Click Score: 20    End of Session Equipment Utilized During Treatment: Rolling walker  (2 wheels)  OT Visit Diagnosis: Other abnormalities of gait and mobility (R26.89);Unsteadiness on feet (R26.81)   Activity Tolerance Patient tolerated treatment well   Patient Left in bed;with call bell/phone within reach;with bed alarm set   Nurse Communication          Time: 1610-9604 OT Time Calculation (min): 12 min  Charges: OT General Charges $OT Visit: 1 Visit OT Treatments $Self Care/Home Management : 8-22 mins  Kathie Dike, M.S. OTR/L  05/07/23, 12:51 PM  ascom [phone]

## 2023-05-07 NOTE — Care Management Important Message (Signed)
Important Message  Patient Details  Name: Leslie Parker MRN: [id number] Date of Birth: 07-28-1932   Important Message Given:  Yes - Medicare IM     Aaban Griep, Stephan Minister 05/07/2023, 11:43 AM

## 2023-05-07 NOTE — Plan of Care (Signed)
  Problem: Education: Goal: Ability to demonstrate management of disease process will improve Outcome: Progressing Goal: Ability to verbalize understanding of medication therapies will improve Outcome: Progressing   Problem: Activity: Goal: Capacity to carry out activities will improve Outcome: Progressing   Problem: Education: Goal: Knowledge of General Education information will improve Description: Including pain rating scale, medication(s)/side effects and non-pharmacologic comfort measures Outcome: Progressing   Problem: Health Behavior/Discharge Planning: Goal: Ability to manage health-related needs will improve Outcome: Progressing   Problem: Clinical Measurements: Goal: Ability to maintain clinical measurements within normal limits will improve Outcome: Progressing   Problem: Elimination: Goal: Will not experience complications related to urinary retention Outcome: Progressing   Problem: Pain Managment: Goal: General experience of comfort will improve Outcome: Progressing

## 2023-05-08 DIAGNOSIS — N189 Chronic kidney disease, unspecified: Secondary | ICD-10-CM | POA: Diagnosis not present

## 2023-05-08 DIAGNOSIS — N179 Acute kidney failure, unspecified: Secondary | ICD-10-CM | POA: Diagnosis not present

## 2023-05-08 LAB — GLUCOSE, CAPILLARY
Glucose-Capillary: 137 mg/dL — ABNORMAL HIGH (ref 70–99)
Glucose-Capillary: 168 mg/dL — ABNORMAL HIGH (ref 70–99)
Glucose-Capillary: 150 mg/dL — ABNORMAL HIGH (ref 70–99)
Glucose-Capillary: 111 mg/dL — ABNORMAL HIGH (ref 70–99)

## 2023-05-08 LAB — BASIC METABOLIC PANEL
Anion gap: 8 (ref 5–15)
BUN: 70 mg/dL — ABNORMAL HIGH (ref 8–23)
CO2: 19 mmol/L — ABNORMAL LOW (ref 22–32)
Calcium: 7.9 mg/dL — ABNORMAL LOW (ref 8.9–10.3)
Chloride: 108 mmol/L (ref 98–111)
Creatinine, Ser: 2.02 mg/dL — ABNORMAL HIGH (ref 0.44–1.00)
GFR, Estimated: 23 mL/min — ABNORMAL LOW (ref >60)
Glucose, Bld: 146 mg/dL — ABNORMAL HIGH (ref 70–99)
Potassium: 4.1 mmol/L (ref 3.5–5.1)
Sodium: 135 mmol/L (ref 135–145)

## 2023-05-08 NOTE — Consult Note (Signed)
[facility] CM Inpatient Consult   05/08/2023  KAE WHARFF 1932-10-09 [id number]  Primary Care Provider:  Marquette Saa)  Patient is currently active with Care Management for chronic disease management services.  Patient has been engaged by a Investment banker, operational.  Our community based plan of care has focused on disease management and community resource support.   Patient will receive a post hospital call and will be evaluated for assessments and disease process education.   Plan: Pt will discharge home with [facility] services. Will alert the nurse community nurse care manager on pt's discharge disposition.  Inpatient Transition Of Care [TOC] team member to make aware that Care Management following.  Of note, Care Management services does not replace or interfere with any services that are needed or arranged by inpatient [facility] care management team.   For additional questions or referrals please contact:  Elliot Cousin, RN, [facility] Liaison [place]   Population Health Office Hours MTWF  8:00 am-6:00 pm [phone] mobile [phone] [Office toll free line] Office Hours are M-F 8:30 - 5 pm [email or web address]

## 2023-05-08 NOTE — Progress Notes (Signed)
Occupational Therapy Treatment Patient Details Name: Leslie Parker MRN: [id number] DOB: [birth year] Today's Date: 05/08/2023   History of present illness Pt is an 89 y/o female presented to ED on 04/30/23 for L hip cyst and pain x 1 week as well as SOB and LE edema. Admitted for acute hypoxic respiratory failure and acute exacerbation of CHF. PMH: sick sinus syndrome s/p pacemaker placement 4/25, HTN, Afib, DM, CKD stage 3, COPD   OT comments  Pt. required minA for LEs during bed mobility to, and from the EOB. Pt. required supervision with the RW for ambulating in the room, and standing to perform self grooming tasks at the sinkside. SpO2 93% HR 92 bpms at rest, SpO2 92% HR 74 bpms after activity. Pt. education was provided about pursed lip breathing techniques, and energy conservation techniques for ADLs. Pt. was provided with a visual handout, and was able to demonstrate PLB. Pt. continues to benefit from OT services for ADL training, A/E training, and pt. Education about energy conservation/work simplification techniques, home modification, and DME. Discharge disposition remains appropriate.      If plan is discharge home, recommend the following:  A little help with walking and/or transfers;A little help with bathing/dressing/bathroom;Help with stairs or ramp for entrance   Equipment Recommendations       Recommendations for Other Services      Precautions / Restrictions Precautions Precautions: Fall Restrictions Weight Bearing Restrictions: No       Mobility Bed Mobility Overal bed mobility: Needs Assistance Bed Mobility: Sit to Supine, Supine to Sit     Supine to sit: Min assist Sit to supine: Contact guard assist   General bed mobility comments: Assist with LEs required during bed mobility    Transfers Overall transfer level: Needs assistance Equipment used: Rolling walker (2 wheels) Transfers: Sit to/from Stand Sit to Stand: Supervision                  Balance                                           ADL either performed or assessed with clinical judgement   ADL                                         General ADL Comments: Supervision self-grroming while standing at the sinkside.    Extremity/Trunk Assessment Upper Extremity Assessment Upper Extremity Assessment: Generalized weakness            Vision       Perception     Praxis      Cognition Arousal: Alert Behavior During Therapy: WFL for tasks assessed/performed Overall Cognitive Status: Within Functional Limits for tasks assessed                                          Exercises      Shoulder Instructions       General Comments SpO2 99% on RA pre-mobility, SpO2 98% on RA post-mobility    Pertinent Vitals/ Pain          Home Living  Prior Functioning/Environment              Frequency  Min 1X/week        Progress Toward Goals  OT Goals(current goals can now be found in the care plan section)  Progress towards OT goals: Progressing toward goals  Acute Rehab OT Goals Patient Stated Goal: To go home OT Goal Formulation: With patient Time For Goal Achievement: 05/15/23 Potential to Achieve Goals: Good  Plan      Co-evaluation                 AM-PAC OT "6 Clicks" Daily Activity     Outcome Measure   Help from another person eating meals?: None Help from another person taking care of personal grooming?: A Little Help from another person toileting, which includes using toliet, bedpan, or urinal?: A Little Help from another person bathing (including washing, rinsing, drying)?: A Little Help from another person to put on and taking off regular upper body clothing?: A Little Help from another person to put on and taking off regular lower body clothing?: A Little 6 Click Score: 19    End of Session Equipment Utilized  During Treatment: Gait belt;Rolling walker (2 wheels)  OT Visit Diagnosis: Other abnormalities of gait and mobility (R26.89);Unsteadiness on feet (R26.81)   Activity Tolerance Patient tolerated treatment well   Patient Left in bed;with call bell/phone within reach;with bed alarm set   Nurse Communication          Time: 1610-9604 OT Time Calculation (min): 21 min  Charges: OT General Charges $OT Visit: 1 Visit OT Treatments $Self Care/Home Management : 8-22 mins  [name], MS, OTR/L   [name] 05/08/2023, 4:17 PM

## 2023-05-08 NOTE — Progress Notes (Signed)
[facility]  - [place] [facility]   PATIENT NAME: Leslie Parker    MR#:  [id number]  DATE OF BIRTH:  02/12/1933  SUBJECTIVE:  some on and off wheezing. Sats remain 96 to 97% on room air. Does not wear chronic oxygen. Wondering when she can go home. No family at bedside.    VITALS:  Blood pressure 135/67, pulse 75, temperature 97.7 F (36.5 C), temperature source Oral, resp. rate 14, height 5\' 9"  (1.753 m), weight 85.1 kg, SpO2 97%.  PHYSICAL EXAMINATION:   GENERAL:  89 y.o.-year-old patient with no acute distress.  LUNGS: Normal breath sounds bilaterally, intermittent wheezing CARDIOVASCULAR: S1, S2 normal. No murmur   ABDOMEN: Soft, nontender, nondistended. Bowel sounds present.  EXTREMITIES: chronic edema b/l.    NEUROLOGIC: nonfocal  patient is alert and awake  LABORATORY PANEL:  CBC Recent Labs  Lab 05/04/23 0333 05/07/23 0509  WBC 12.7*  --   HGB 12.3 10.6*  HCT 36.9  --   PLT 262  --     Chemistries  Recent Labs  Lab 05/02/23 0512 05/03/23 0349 05/08/23 0542  NA 132*   < > 135  K 4.2   < > 4.1  CL 98   < > 108  CO2 23   < > 19*  GLUCOSE 364*   < > 146*  BUN 37*   < > 70*  CREATININE 2.04*   < > 2.02*  CALCIUM 8.0*   < > 7.9*  MG 1.9  --   --    < > = values in this interval not displayed.    RADIOLOGY:  US RENAL  Result Date: 05/06/2023 CLINICAL DATA:  Acute kidney injury. EXAM: RENAL / URINARY TRACT ULTRASOUND COMPLETE COMPARISON:  Dec 04, 2020. FINDINGS: Right Kidney: Renal measurements: 11.9 x 6.6 x 5.7 cm = volume: 233 mL. Multiple cysts are noted, the largest measuring 9.5 cm. Echogenicity within normal limits. No mass or hydronephrosis visualized. Left Kidney: Renal measurements: 11.3 x 5.2 x 5.2 cm = volume: 158 mL. Multiple cysts are noted, the largest measuring 4.8 cm. Echogenicity within normal limits. No mass or hydronephrosis visualized. Bladder: Appears normal for degree of bladder distention. Other: None. IMPRESSION:  Multiple bilateral renal cysts. No hydronephrosis or renal obstruction is noted. Electronically Signed   By: Lupita Raider M.D.   On: 05/06/2023 20:26    Assessment and Plan  89 year old female past medical history of chronic diastolic dysfunction, pacemaker, history of atrial fibrillation on chronic Eliquis, type 2 diabetes mellitus with stage III chronic kidney disease, COPD, recent left hip arthroplasty.  She has had worsening hip pain over the last week found to have an elevated blood pressure came in with shortness of breath and found to be hypoxic.   10/9.  Started on Solu-Medrol secondary to diffuse wheezing and COPD exacerbation.  Replacing IV magnesium. 10/10.  Creatinine up to 2.04.  Holding Lasix.  Continue Solu-Medrol today. 10/11.  Creatinine up to 2.47.  Will give fluid bolus today.  Hold steroids with confusion.  Has upper airway wheeze.  Continue nebulizer. 10/12.  Patient with acute delirium.  Creatinine improved to 2.28.  Spoke with granddaughter on the phone patient does live alone. 10/13.  Creatinine 2.43.  Fluid bolus given. 10/14 creatinine 2.45.  Will give 2 fluid boluses today.  Renal ultrasound showing multiple bilateral renal cysts. 10/15.  Creatinine down to 2.2.  Gentle IV fluid hydration.  Delirium has cleared. 10/6 :assuming care of patient.  She is laying in bed comfortably. No family at bedside. Some wheezing. Sats more than 95% on room air. Will discontinue IV fluid since creatinine improving. Tolerating PO diet. Patient wondering when she can go home.   Assessment and Plan: Acute kidney injury superimposed on CKD (HCC) AKI on CKD stage IIIb.  Creatinine 2.45 yesterday and down to 2.2 today.received fluids.  Bladder scan negative.  Renal ultrasound shows bilateral renal cysts but otherwise negative. -- Creatinine down to 2.02. Baseline creatinine around 1.8. DC IV fluids.   Acute delirium Improved.  As needed Seroquel for insomnia/delirium.     Acute  respiratory failure with hypoxia (HCC) COPD with acute exacerbation (HCC) No steroids with acute delirium.  Continue albuterol nebulizers.  Patient takes Trelegy at home.  Continue Trelegy substitute here.  Completed antibiotics. Patient sats are above 96 to 97% on room air.  Acute on chronic diastolic CHF (congestive heart failure) (HCC) Lasix held with overdiuresis.  Last echocardiogram shows an EF of 60% I don't see Lasix on home meds  Paroxysmal atrial fibrillation (HCC) Continue amiodarone, metoprolol and Cardizem for rate control Continue Eliquis as primary prophylaxis for an acute stroke   Essential hypertension Continue metoprolol and Cardizem   Type II diabetes mellitus with renal manifestations (HCC) Patient with diabetes mellitus with complications of stage IIIb chronic kidney disease Hold glipizide Last hemoglobin A1c is 5.8   Hyponatremia 1 point less than the normal range   Left hip pain Seen by orthopedic and recommended PT   Overweight (BMI 25.0-29.9) BMI 27.71   Myocardial injury Likely secondary to acute respiratory failure, CHF and COPD exacerbation.   Hypokalemia Replaced   Hypomagnesemia Replaced  Improving slowly.  Procedures: Family communication : granddaughter on the phone Consults : CODE STATUS: full DVT Prophylaxis : eliquis Level of care: Med-Surg Status is: Inpatient Remains inpatient appropriate because: will continue to monitor for one more day if remains stable discharge home with home health tomorrow. Patient agreeable    TOTAL TIME TAKING CARE OF THIS PATIENT: 35 minutes.  >50% time spent on counselling and coordination of care  Note: This dictation was prepared with Dragon dictation along with smaller phrase technology. Any transcriptional errors that result from this process are unintentional.  Enedina Finner M.D    [facility]   CC: Primary care physician; [name], MD

## 2023-05-08 NOTE — Progress Notes (Signed)
Physical Therapy Treatment Patient Details Name: Leslie Parker MRN: [id number] DOB: May 22, 1933 Today's Date: 05/08/2023   History of Present Illness Pt is an 89 y/o female presented to ED on 04/30/23 for L hip cyst and pain x 1 week as well as SOB and LE edema. Admitted for acute hypoxic respiratory failure and acute exacerbation of CHF. PMH: sick sinus syndrome s/p pacemaker placement 4/25, HTN, Afib, DM, CKD stage 3, COPD    PT Comments  Pt is received in bed, she is agreeable to PT session. Pt performs bed mobility, transfers, and amb supA for safety. SpO2% noted pre and post session with ranges of 98-99%. Pt able to amb approx 100 ft using RW with noted gait impairments (see below) and reports of 8/10 modified RPE following amb activity. Encouraged Pt throughout amb to pursed lip breathing to optimize energy conservation. Overall, Pt continues to demonstrate steady progression towards PT goals and would benefit from continued skilled PT to address above deficits and promote optimal return to PLOF.   If plan is discharge home, recommend the following: A little help with walking and/or transfers;A little help with bathing/dressing/bathroom;Assist for transportation   Can travel by private vehicle        Equipment Recommendations  Rolling walker (2 wheels)    Recommendations for Other Services       Precautions / Restrictions Precautions Precautions: Fall Restrictions Weight Bearing Restrictions: No     Mobility  Bed Mobility Overal bed mobility: Needs Assistance Bed Mobility: Sit to Supine, Supine to Sit     Supine to sit: Supervision, HOB elevated, Used rails Sit to supine: Supervision, Used rails   General bed mobility comments: Pt able to perform bed mobility supA; no cuing required for task performance    Transfers Overall transfer level: Needs assistance Equipment used: Rolling walker (2 wheels) Transfers: Sit to/from Stand Sit to Stand: Supervision            General transfer comment: no cuing required for hand placement    Ambulation/Gait Ambulation/Gait assistance: Supervision Gait Distance (Feet): 100 Feet Assistive device: Rolling walker (2 wheels) Gait Pattern/deviations: Step-through pattern, Decreased step length - right, Decreased step length - left, Trunk flexed, Antalgic Gait velocity: decreased     General Gait Details: Slow cadence but steady with no overt LOB; mod verbal cues for amb closer to the RW with upright posture; antalgic gait with noted LLE crossing midline   Stairs             Wheelchair Mobility     Tilt Bed    Modified Rankin (Stroke Patients Only)       Balance Overall balance assessment: Needs assistance Sitting-balance support: Feet supported Sitting balance-[name]: Normal Sitting balance - Comments: Pt able to maintain seated EOB during functional activities   Standing balance support: No upper extremity supported, During functional activity Standing balance-[name]: Good Standing balance comment: Pt able to maintain static and dynamic standing balance with min swaying using RW                            Cognition Arousal: Alert Behavior During Therapy: WFL for tasks assessed/performed Overall Cognitive Status: Within Functional Limits for tasks assessed                                 General Comments: Pleasant and cooperative with PT session; Pt  reports feeling "winded" when using bathroom earlier today        Exercises Other Exercises Other Exercises: Educated Pt on pursed lip breathing to promote energy conservation technique    General Comments General comments (skin integrity, edema, etc.): SpO2 99% on RA pre-mobility, SpO2 98% on RA post-mobility      Pertinent Vitals/Pain Pain Assessment Pain Assessment: No/denies pain    Home Living                          Prior Function            PT Goals (current goals can now  be found in the care plan section) Acute Rehab PT Goals Patient Stated Goal: to go home PT Goal Formulation: With patient Time For Goal Achievement: 05/15/23 Potential to Achieve Goals: Good Progress towards PT goals: Progressing toward goals    Frequency    Min 1X/week      PT Plan      Co-evaluation              AM-PAC PT "6 Clicks" Mobility   Outcome Measure  Help needed turning from your back to your side while in a flat bed without using bedrails?: A Little Help needed moving from lying on your back to sitting on the side of a flat bed without using bedrails?: A Little Help needed moving to and from a bed to a chair (including a wheelchair)?: A Little Help needed standing up from a chair using your arms (e.g., wheelchair or bedside chair)?: A Little Help needed to walk in hospital room?: A Little Help needed climbing 3-5 steps with a railing? : A Little 6 Click Score: 18    End of Session Equipment Utilized During Treatment: Gait belt Activity Tolerance: Patient tolerated treatment well Patient left: with call bell/phone within reach;in bed;with bed alarm set Nurse Communication: Mobility status PT Visit Diagnosis: Muscle weakness (generalized) (M62.81);Difficulty in walking, not elsewhere classified (R26.2);Pain Pain - Right/Left: Left Pain - part of body: Hip     Time: 5366-4403 PT Time Calculation (min) (ACUTE ONLY): 14 min  Charges:                            Elmon Else, SPT    Carolyna Yerian 05/08/2023, 3:32 PM

## 2023-05-08 NOTE — Plan of Care (Signed)
  Problem: Education: Goal: Ability to demonstrate management of disease process will improve Outcome: Progressing   Problem: Activity: Goal: Capacity to carry out activities will improve Outcome: Progressing   Problem: Education: Goal: Knowledge of General Education information will improve Description: Including pain rating scale, medication(s)/side effects and non-pharmacologic comfort measures Outcome: Progressing   Problem: Health Behavior/Discharge Planning: Goal: Ability to manage health-related needs will improve Outcome: Progressing   Problem: Activity: Goal: Risk for activity intolerance will decrease Outcome: Progressing   Problem: Elimination: Goal: Will not experience complications related to urinary retention Outcome: Progressing

## 2023-05-09 DIAGNOSIS — N179 Acute kidney failure, unspecified: Secondary | ICD-10-CM | POA: Diagnosis not present

## 2023-05-09 DIAGNOSIS — N189 Chronic kidney disease, unspecified: Secondary | ICD-10-CM | POA: Diagnosis not present

## 2023-05-09 LAB — GLUCOSE, CAPILLARY
Glucose-Capillary: 135 mg/dL — ABNORMAL HIGH (ref 70–99)
Glucose-Capillary: 160 mg/dL — ABNORMAL HIGH (ref 70–99)

## 2023-05-09 MED ORDER — FUROSEMIDE 20 MG PO TABS: 20.0000 mg | ORAL_TABLET | ORAL | 0 refills | Status: DC

## 2023-05-09 NOTE — Discharge Summary (Signed)
Physician Discharge Summary   Patient: Leslie Parker MRN: [id number] DOB: 1933-01-29  Admit date:     04/30/2023  Discharge date: 05/09/23  Discharge Physician: Enedina Finner   PCP: [name], MD   Recommendations at discharge:   follow-up with Dr. Nemiah Commander on your upcoming appointment. You will need metabolic panel be checked on your next doctors appointment follow-up with Dr. Meredeth Ide in 1 to 2 weeks  Discharge Diagnoses: Principal Problem:   Acute kidney injury superimposed on CKD [facility]) Active Problems:   Acute delirium   Acute respiratory failure with hypoxia (HCC)   COPD with acute exacerbation (HCC)   Acute on chronic diastolic CHF (congestive heart failure) (HCC)   Paroxysmal atrial fibrillation (HCC)   Essential hypertension   Type II diabetes mellitus with renal manifestations (HCC)   Hypomagnesemia   Hypokalemia   Myocardial injury   Overweight (BMI 25.0-29.9)   Left hip pain   Hyponatremia  89 year old female past medical history of chronic diastolic dysfunction, pacemaker, history of atrial fibrillation on chronic Eliquis, type 2 diabetes mellitus with stage III chronic kidney disease, COPD, recent left hip arthroplasty.  She has had worsening hip pain over the last week found to have an elevated blood pressure came in with shortness of breath and found to be hypoxic.   10/9.  Started on Solu-Medrol secondary to diffuse wheezing and COPD exacerbation.  Replacing IV magnesium. 10/10.  Creatinine up to 2.04.  Holding Lasix.  Continue Solu-Medrol today. 10/11.  Creatinine up to 2.47.  Will give fluid bolus today.  Hold steroids with confusion.  Has upper airway wheeze.  Continue nebulizer. 10/12.  Patient with acute delirium.  Creatinine improved to 2.28.  Spoke with granddaughter on the phone patient does live alone. 10/13.  Creatinine 2.43.  Fluid bolus given. 10/14 creatinine 2.45.  Will give 2 fluid boluses today.  Renal ultrasound showing multiple bilateral  renal cysts. 10/15.  Creatinine down to 2.2.  Gentle IV fluid hydration.  Delirium has cleared. 10/6 :assuming care of patient. She is laying in bed comfortably. No family at bedside. Some wheezing. Sats more than 95% on room air. Will discontinue IV fluid since creatinine improving. Tolerating PO diet. Patient wondering when she can go home.   Assessment and Plan: Acute kidney injury superimposed on CKD (HCC) AKI on CKD stage IIIb.  Creatinine 2.45 yesterday and down to 2.2 today.received fluids.  Bladder scan negative.  Renal ultrasound shows bilateral renal cysts but otherwise negative. -- Creatinine down to 2.02. Baseline creatinine around 1.8. DC IV fluids.   Acute delirium Improved.  As needed Seroquel for insomnia/delirium.     Acute respiratory failure with hypoxia (HCC) COPD with acute exacerbation (HCC) No steroids with acute delirium.  Continue albuterol nebulizers.  Patient takes Trelegy at home.  Continue Trelegy substitute here.  Completed antibiotics. Patient sats are above 96 to 97% on room air.   Acute on chronic diastolic CHF (congestive heart failure) (HCC) Lasix held with overdiuresis.  Last echocardiogram shows an EF of 60% --d/w pt given her h/o CHF and leg edema will give lasix 20 mg every other day with PCP watching her creat   Paroxysmal atrial fibrillation (HCC) Continue amiodarone, metoprolol and Cardizem for rate control Continue Eliquis as primary prophylaxis for an acute stroke   Essential hypertension Continue metoprolol and Cardizem   Type II diabetes mellitus with renal manifestations (HCC) Patient with diabetes mellitus with complications of stage IIIb chronic kidney disease I have resumed glipizide at d/c  but if A1c remains <6.0 then will defer to PCP if it can be d/ced Last hemoglobin A1c is 5.8   Hyponatremia 1 point less than the normal range   Left hip pain Seen by orthopedic and recommended PT   Overweight (BMI 25.0-29.9) BMI 27.71    Myocardial injury Likely secondary to acute respiratory failure, CHF and COPD exacerbation.   Hypokalemia Replaced   Hypomagnesemia Replaced   Improving slowly. Eager to go home today. Resume HH at d/c   Family communication : granddaughter on the phone 10/16 Consults :[facility] cardiology, ortho dr Joice Lofts CODE STATUS: full DVT Prophylaxis : eliquis Level of care: Med-Surg     Pain control - [facility] Controlled Substance Reporting System database was reviewed. and patient was instructed, not to drive, operate heavy machinery, perform activities at heights, swimming or participation in water activities or provide baby-sitting services while on Pain, Sleep and Anxiety Medications; until their outpatient Physician has advised to do so again. Also recommended to not to take more than prescribed Pain, Sleep and Anxiety Medications.  Disposition: Home health Diet recommendation:  Discharge Diet Orders (From admission, onward)     Start     Ordered   05/09/23 0000  Diet - low sodium heart healthy        05/09/23 1024           Cardiac and Carb modified diet DISCHARGE MEDICATION: Allergies as of 05/09/2023       Reactions   Baclofen Other (See Comments)   Elevated Cr   Simvastatin Other (See Comments)   Pt denies Elevated LFTs with simva 40mg , stopped and resolved (see MD note 11/10/13)        Medication List     TAKE these medications    albuterol 108 (90 Base) MCG/ACT inhaler Commonly known as: VENTOLIN HFA Inhale 2 puffs into the lungs every 6 (six) hours as needed.   amiodarone 200 MG tablet Commonly known as: PACERONE Take 200 mg by mouth daily.   apixaban 2.5 MG Tabs tablet Commonly known as: ELIQUIS Take 1 tablet (2.5 mg total) by mouth 2 (two) times daily.   ascorbic acid 250 MG Chew Commonly known as: VITAMIN C Chew 250 mg by mouth daily.   B-12 2500 MCG Tabs Take 2,500 mcg by mouth daily.   bisacodyl 10 MG suppository Commonly known as:  DULCOLAX Place 1 suppository (10 mg total) rectally daily as needed for moderate constipation.   colestipol 1 g tablet Commonly known as: COLESTID Take 1 tablet by mouth 2 (two) times daily.   diltiazem 360 MG 24 hr capsule Commonly known as: CARDIZEM CD Take 360 mg by mouth daily.   docusate sodium 100 MG capsule Commonly known as: COLACE Take 1 capsule (100 mg total) by mouth 2 (two) times daily.   feeding supplement Liqd Take 237 mLs by mouth 2 (two) times daily between meals.   furosemide 20 MG tablet Commonly known as: LASIX Take 1 tablet (20 mg total) by mouth every other day. What changed: when to take this   glipiZIDE 2.5 MG 24 hr tablet Commonly known as: GLUCOTROL XL Take 2.5 mg by mouth daily with breakfast.   ipratropium-albuterol 0.5-2.5 (3) MG/3ML Soln Commonly known as: DUONEB Inhale 3 mLs into the lungs every 6 (six) hours as needed.   Magnesium Oxide -Mg Supplement 500 MG Caps Take 500 mg by mouth daily.   methocarbamol 500 MG tablet Commonly known as: ROBAXIN Take 1 tablet (500 mg total) by  mouth every 8 (eight) hours as needed for muscle spasms.   metoprolol tartrate 25 MG tablet Commonly known as: LOPRESSOR Take 1 tablet (25 mg total) by mouth 2 (two) times daily.   multivitamin with minerals Tabs tablet Take 1 tablet by mouth daily. One-A-Day Women's Vitamin   omeprazole 20 MG capsule Commonly known as: PRILOSEC Take 20 mg by mouth every morning.   oxyCODONE-acetaminophen 5-325 MG tablet Commonly known as: PERCOCET/ROXICET Take 1 tablet by mouth every 4 (four) hours as needed for moderate pain.   polyethylene glycol powder 17 GM/SCOOP powder Commonly known as: MiraLax 1 capful twice a day in 8 oz of juice or water until you have a bowel movement and then once a day every day   traZODone 100 MG tablet Commonly known as: DESYREL Take 100 mg by mouth at bedtime.   Trelegy Ellipta 100-62.5-25 MCG/ACT Aepb Generic drug:  Fluticasone-Umeclidin-Vilant Inhale 1 puff into the lungs daily.   Vitamin D-1000 Max St 25 MCG (1000 UT) tablet Generic drug: Cholecalciferol Take 1,000 Units by mouth daily.               Discharge Care Instructions  (From admission, onward)           Start     Ordered   05/09/23 0000  Discharge wound care:       Comments: Pressure Injury 03/21/23 Ankle Posterior;Right Deep Tissue Pressure Injury - Purple or maroon localized area of discolored intact skin or blood-filled blister due to damage of underlying soft tissue from pressure and/or shear. 49 days    Pressure Injury 03/21/23 Buttocks Right Stage 3 -  Full thickness tissue loss. Subcutaneous fat may be visible but bone, tendon or muscle are NOT exposed. 49 days     Pressure Injury 03/21/23 Ankle Posterior;Right Deep Tissue Pressure Injury - Purple or maroon localized area of discolored intact skin or blood-filled blister due to damage of underlying soft tissue from pressure and/or shear. 49 days    Pressure Injury 03/21/23 Buttocks Right Stage 3 -  Full thickness tissue loss. Subcutaneous fat may be visible but bone, tendon or muscle are NOT exposed. 49 days   05/09/23 1024            Follow-up Information     Callwood, Dwayne D, MD. Go in 1 week(s).   Specialties: Cardiology, Internal Medicine Contact information: [address] [place] [address] [phone]         [name], MD. Schedule an appointment as soon as possible for a visit in 10 day(s).   Specialty: Internal Medicine Why: [place] f/u and lab check [facility]) Contact information: [address] [place] [address] [phone]                Discharge Exam: Ceasar Mons Weights   05/02/23 0618 05/03/23 0344 05/04/23 0500  Weight: 81.4 kg 87.5 kg 85.1 kg   GENERAL:  89 y.o.-year-old patient with no acute distress.  LUNGS: Normal breath sounds bilaterally no wheezing CARDIOVASCULAR: S1, S2 normal. No murmur    ABDOMEN: Soft, nontender, nondistended. Bowel sounds present.  EXTREMITIES: chronic ++ edema b/l.    NEUROLOGIC: nonfocal  patient is alert and awake  Condition at discharge: fair  The results of significant diagnostics from this hospitalization (including imaging, microbiology, ancillary and laboratory) are listed below for reference.   Imaging Studies: US RENAL  Result Date: 05/06/2023 CLINICAL DATA:  Acute kidney injury. EXAM: RENAL / URINARY TRACT ULTRASOUND COMPLETE COMPARISON:  Dec 04, 2020. FINDINGS: Right Kidney: Renal measurements: 11.9 x 6.6 x 5.7 cm = volume: 233 mL. Multiple cysts are noted, the largest measuring 9.5 cm. Echogenicity within normal limits. No mass or hydronephrosis visualized. Left Kidney: Renal measurements: 11.3 x 5.2 x 5.2 cm = volume: 158 mL. Multiple cysts are noted, the largest measuring 4.8 cm. Echogenicity within normal limits. No mass or hydronephrosis visualized. Bladder: Appears normal for degree of bladder distention. Other: None. IMPRESSION: Multiple bilateral renal cysts. No hydronephrosis or renal obstruction is noted. Electronically Signed   By: Lupita Raider M.D.   On: 05/06/2023 20:26   CT CHEST WO CONTRAST  Result Date: 04/30/2023 CLINICAL DATA:  Shortness of breath.  Pneumonia EXAM: CT CHEST WITHOUT CONTRAST TECHNIQUE: Multidetector CT imaging of the chest was performed following the standard protocol without IV contrast. RADIATION DOSE REDUCTION: This exam was performed according to the departmental dose-optimization program which includes automated exposure control, adjustment of the mA and/or kV according to patient size and/or use of iterative reconstruction technique. COMPARISON:  X-ray earlier 04/30/2023. Old CT scan 03/21/2023. Other exams as well. FINDINGS: Cardiovascular: Heart is nonenlarged. No significant pericardial effusion. Battery pack along the left upper chest for pacemaker with leads extending along the right side of the heart.  Coronary artery calcifications are seen. The thoracic aorta has a normal course and caliber with scattered calcified atherosclerotic plaque. Mediastinum/Nodes: Moderate hiatal hernia. Normal caliber thoracic esophagus which is slightly patulous. Heterogeneous thyroid gland with multiple cysts and calcifications, unchanged from previous examination. No specific abnormal lymph node enlargement identified in the axillary regions, hilum or mediastinum. Only a few small less than 1 cm in size in short axis mediastinal nodes are seen, nonpathologic by size criteria. Lungs/Pleura: Previous pleural effusions are no longer seen by CT. There is some lower lobe areas of bronchial wall thickening with subtle areas of luminal opacity, left-greater-than-right. Associated bandlike areas of opacity along both lung bases. Atelectasis is favored. There is also some bandlike changes along the posteroinferior right upper lobe. No frank consolidation, pneumothorax. Slight areas of patchy ground-glass along the posteroinferior right upper lobe such as axial series 3, image 62. the previous areas of ground-glass opacity with nodularity in the superior segment left lower lobe have improved and may have been infectious or inflammatory. Upper Abdomen: Dilated biliary tree, unchanged from previous. Nodular contours of the liver. Cystic lesion in the spleen. Numerous cystic foci along the kidneys several which are complex and hyperdense. Is also a cystic area posterior to the stomach on image 140 of series 2. Again these changes are stable from the prior more recent chest CT. Please correlate for any known prior workup or dedicated evaluation when appropriate there is remote MRI as well from May 2022 Musculoskeletal: Streak artifact related to the patient's right shoulder arthroplasty. Moderate degenerative changes of the spine. Slight compression of the inferior endplate of T12, unchanged from previous. IMPRESSION: Changing lung opacities  from the prior CT scan of August 2024. Improved areas of nodular opacity in the superior segment left lower lobe with improved pleural effusions. There is persistent lower lung bilateral bronchial wall thickening and some bronchial debris and mucous plugging. New subtle patchy ground-glass along the posteroinferior right upper lobe. Recommend continued follow-up. Hiatal hernia. Numerous changes in the abdomen including biliary ductal dilatation, splenic, renal and retroperitoneal cystic lesions. Please correlate with previous workup or additional evaluation when appropriate. Pacemaker Aortic Atherosclerosis (ICD10-I70.0) and Emphysema (ICD10-J43.9). Electronically Signed   By: Piedad Climes.D.  On: 04/30/2023 17:58   CT HIP LEFT WO CONTRAST  Result Date: 04/30/2023 CLINICAL DATA:  Left hip pain with cyst for 1 week. Replacement 2 years ago. Recent green drainage, infection suspected. EXAM: CT OF THE LEFT HIP WITHOUT CONTRAST TECHNIQUE: Multidetector CT imaging of the left hip was performed according to the standard protocol. Multiplanar CT image reconstructions were also generated. RADIATION DOSE REDUCTION: This exam was performed according to the departmental dose-optimization program which includes automated exposure control, adjustment of the mA and/or kV according to patient size and/or use of iterative reconstruction technique. COMPARISON:  Radiographs 04/30/2023, 02/13/2023 and 01/26/2017. FINDINGS: Despite efforts by the technologist and patient, mild motion artifact is present on today's exam and could not be eliminated. This reduces exam sensitivity and specificity. Bones/Joint/Cartilage Status post left total hip arthroplasty. There is a screw fixed acetabular component which appears intact without loosening. Evaluation of the proximal femur is limited by motion and beam hardening artifact from the femoral prosthesis. There is an intact proximal cerclage wire. No evidence of acute fracture or  dislocation. Chronic nonunion of the greater trochanter. No definite bone destruction is seen, although is difficult to exclude endosteal thinning along the posterolateral aspect of the femoral prosthesis given the motion. No evidence of significant joint effusion. Ligaments Suboptimally assessed by CT. Muscles and Tendons Generalized muscular atrophy. No focal intramuscular fluid collection identified. Soft tissues There is stranding in the subcutaneous fat posterior and lateral to the left hip. No focal fluid collection, unexpected foreign body or soft tissue emphysema identified. Iliofemoral atherosclerosis noted. The visualized internal pelvic contents are notable for prominent sigmoid diverticulosis without evidence of acute inflammation. IMPRESSION: 1. Limited evaluation of the proximal left femur due to motion and beam hardening artifact from the femoral prosthesis. No definite bone destruction is seen, although endosteal thinning along the posterolateral aspect of the femoral prosthesis is difficult to exclude. Follow-up may be warranted. 2. No evidence of acute fracture or dislocation. 3. Nonspecific subcutaneous stranding posterior and lateral to the left hip without focal fluid collection, unexpected foreign body or soft tissue emphysema. 4. Generalized muscular atrophy. Electronically Signed   By: Carey Bullocks M.D.   On: 04/30/2023 17:26   DG HIP UNILAT WITH PELVIS 2-3 VIEWS LEFT  Result Date: 04/30/2023 CLINICAL DATA:  Left hip pain. EXAM: DG HIP (WITH OR WITHOUT PELVIS) 2-3V LEFT COMPARISON:  01/26/2017. FINDINGS: Evaluation is limited due to artifact from overlying bowel gas. No acute displaced fracture or dislocation. No aggressive osseous lesion. There are changes of chronic pubic symphisitis. Left hip arthroplasty noted. There is open reduction and internal fixation of intertrochanteric right proximal femur fracture with trochanteric fixation nail and helical blade. No radiopaque foreign  bodies. IMPRESSION: 1. No acute displaced fracture or dislocation. 2. Left hip arthroplasty. Electronically Signed   By: Jules Schick M.D.   On: 04/30/2023 15:21   DG Chest 2 View  Result Date: 04/30/2023 CLINICAL DATA:  Shortness of breath. EXAM: CHEST - 2 VIEW COMPARISON:  04/12/2023. FINDINGS: There are probable atelectatic changes at the left lung base. Bilateral lung fields are otherwise clear. No acute consolidation or lung collapse. Bilateral costophrenic angles are clear. Normal cardio-mediastinal silhouette. Aortic arch calcifications noted. No acute osseous abnormalities. Partially seen right reverse total shoulder arthroplasty.  T There is a left-sided dual lead cardiac pacemaker. The soft tissues are within normal limits. IMPRESSION: No active cardiopulmonary disease. Electronically Signed   By: Jules Schick M.D.   On: 04/30/2023 15:17   DG  Chest 2 View  Result Date: 04/12/2023 CLINICAL DATA:  cough/ recent pneumonia EXAM: CHEST - 2 VIEW COMPARISON:  CXR 03/25/23 FINDINGS: Left-sided dual lead cardiac device in place with unchanged lead positioning. Right shoulder arthroplasty. No pleural effusion. No pneumothorax. Unchanged cardiac and mediastinal contours. Unchanged left infrahilar airspace opacity, which could represent atelectasis or infection. No radiographically apparent displaced rib fractures. Visualized upper abdomen is unremarkable. Vertebral body heights are maintained. IMPRESSION: Unchanged left infrahilar airspace opacity, which could represent atelectasis or infection. Electronically Signed   By: Lorenza Cambridge M.D.   On: 04/12/2023 14:31   DG Abdomen 1 View  Result Date: 04/12/2023 CLINICAL DATA:  Constipation, nausea, vomiting EXAM: ABDOMEN - 1 VIEW COMPARISON:  None Available. FINDINGS: There is a moderate stool burden throughout the colon. There is no evidence of mechanical obstruction. There is no definite free intraperitoneal air. A 1.5 cm metallic density object  projecting over the right colon on the first images of uncertain etiology. Cholecystectomy clips are noted. There is S shaped scoliosis of the lumbar spine with multilevel degenerative changes. Postsurgical changes in both femurs are noted without evidence of complication. IMPRESSION: Moderate colonic stool burden without evidence of obstruction. Electronically Signed   By: Lesia Hausen M.D.   On: 04/12/2023 14:30    Microbiology: Results for orders placed or performed during the hospital encounter of 04/30/23  SARS Coronavirus 2 by RT PCR (hospital order, performed in [facility] hospital lab) *cepheid single result test* Anterior Nasal Swab     Status: None   Collection Time: 04/30/23  4:06 PM   Specimen: Anterior Nasal Swab  Result Value Ref Range Status   SARS Coronavirus 2 by RT PCR NEGATIVE NEGATIVE Final    Comment: (NOTE) SARS-CoV-2 target nucleic acids are NOT DETECTED.  The SARS-CoV-2 RNA is generally detectable in upper and lower respiratory specimens during the acute phase of infection. The lowest concentration of SARS-CoV-2 viral copies this assay can detect is 250 copies / mL. A negative result does not preclude SARS-CoV-2 infection and should not be used as the sole basis for treatment or other patient management decisions.  A negative result may occur with improper specimen collection / handling, submission of specimen other than nasopharyngeal swab, presence of viral mutation(s) within the areas targeted by this assay, and inadequate number of viral copies (<250 copies / mL). A negative result must be combined with clinical observations, patient history, and epidemiological information.  Fact Sheet for Patients:   RoadLapTop.co.za  Fact Sheet for Healthcare Providers: http://kim-miller.com/  This test is not yet approved or  cleared by the [place] FDA and has been authorized for detection and/or diagnosis of SARS-CoV-2  by FDA under an Emergency Use Authorization (EUA).  This EUA will remain in effect (meaning this test can be used) for the duration of the COVID-19 declaration under Section 564(b)(1) of the Act, 21 U.S.C. section 360bbb-3(b)(1), unless the authorization is terminated or revoked sooner.  Performed at [facility], [address] [place], [place] [address]     Labs: CBC: Recent Labs  Lab 05/03/23 0349 05/04/23 0333 05/07/23 0509  WBC 11.7* 12.7*  --   HGB 11.1* 12.3 10.6*  HCT 33.8* 36.9  --   MCV 91.6 90.9  --   PLT 203 262  --    Basic Metabolic Panel: Recent Labs  Lab 05/04/23 0333 05/05/23 0813 05/06/23 0748 05/07/23 0509 05/08/23 0542  NA 133* 134* 135 134* 135  K 4.5 4.2 4.2 3.9  4.1  CL 101 104 105 105 108  CO2 25 23 21* 21* 19*  GLUCOSE 147* 162* 204* 182* 146*  BUN 66* 64* 76* 71* 70*  CREATININE 2.28* 2.43* 2.45* 2.20* 2.02*  CALCIUM 8.6* 8.0* 7.9* 7.7* 7.9*   CBG: Recent Labs  Lab 05/08/23 0814 05/08/23 1144 05/08/23 1605 05/08/23 2055 05/09/23 0753  GLUCAP 137* 168* 150* 111* 135*    Discharge time spent: greater than 30 minutes.  Signed: Enedina Finner, MD [facility] 05/09/2023

## 2023-05-09 NOTE — Plan of Care (Signed)
?  Problem: Activity: ?Goal: Capacity to carry out activities will improve ?Outcome: Progressing ?  ?

## 2023-05-09 NOTE — TOC Transition Note (Signed)
Transition of Care [facility]) - CM/SW Discharge Note   Patient Details  Name: Leslie Parker MRN: [id number] Date of Birth: 1932-09-04  Transition of Care [facility]) CM/SW Contact:  Marlowe Sax, RN Phone Number: 05/09/2023, 11:17 AM   Clinical Narrative:    Met with the patient in the room, her daughter will be coming from [facility] to pick her up today, she has a rolling walker and 3 in 1 at home, she is set up with [facility] for [facility]     Barriers to Discharge: Continued Medical Work up   Patient Goals and CMS Choice CMS Medicare.gov Compare Post Acute Care list provided to:: Patient Choice offered to / list presented to : Patient  Discharge Placement                         Discharge Plan and Services Additional resources added to the After Visit Summary for                                       Social Determinants of Health (SDOH) Interventions SDOH Screenings   Food Insecurity: No Food Insecurity (05/01/2023)  Housing: Low Risk  (05/01/2023)  Transportation Needs: No Transportation Needs (05/01/2023)  Utilities: Not At Risk (05/01/2023)  Tobacco Use: Low Risk  (05/01/2023)     Readmission Risk Interventions    03/21/2023    1:12 PM 08/27/2022   12:05 PM  Readmission Risk Prevention Plan  Transportation Screening Complete Complete  PCP or Specialist Appt within 3-5 Days Complete Complete  HRI or Home Care Consult Complete Complete  Social Work Consult for Recovery Care Planning/Counseling Complete   Palliative Care Screening Not Applicable   Medication Review Oceanographer) Complete Complete

## 2023-05-10 ENCOUNTER — Telehealth: Payer: Self-pay | Admitting: *Deleted

## 2023-05-10 NOTE — Patient Outreach (Signed)
Care Coordination   Follow Up Visit Note   05/10/2023 Name: Leslie Parker MRN: [id number] DOB: August 10, 1932  Leslie Parker is a 89 y.o. year old female who sees [name], MD for primary care. I spoke with  [name] by phone today.  What matters to the patients health and wellness today?  Readmitted to hospital after fall and fluid overload, state she is much better now.  Has daughter with her from [place] while recovering.    Goals Addressed             This Visit's Progress    Free from recurrent fall   On track    Care Coordination Interventions: Provided written and verbal education re: potential causes of falls and Fall prevention strategies Reviewed medications and discussed potential side effects of medications such as dizziness and frequent urination Advised patient of importance of notifying provider of falls Assessed for falls since last encounter Assessed patients knowledge of fall risk prevention secondary to previously provided education      Management of COPD   On track      Interventions Today    Flowsheet Row Most Recent Value  Chronic Disease   Chronic disease during today's visit Chronic Obstructive Pulmonary Disease (COPD), Other, Congestive Heart Failure (CHF)  [fall]  General Interventions   General Interventions Discussed/Reviewed General Interventions Reviewed, Doctor Visits, [facility], Horticulturist, commercial (DME)  Jorje Guild with HH]  Doctor Visits Discussed/Reviewed Doctor Visits Reviewed, PCP, Specialist  [upcoming PCP 10/21, cardiology and heart failure clinic both 10/24]  Durable Medical Equipment (DME) Emmie Niemann walker/rollator]  PCP/Specialist Visits Compliance with follow-up visit  Exercise Interventions   Exercise Discussed/Reviewed Physical Activity, Weight Managment  Physical Activity Discussed/Reviewed Physical Activity Reviewed  Jorje Guild with PT/OT]  Weight Management Weight maintenance  Education  Interventions   Education Provided Provided Education  Provided Verbal Education On Medication, When to see the doctor, Nutrition  Nutrition Interventions   Nutrition Discussed/Reviewed Nutrition Reviewed, Increasing proteins, Decreasing salt, Adding fruits and vegetables              SDOH assessments and interventions completed:  No     Care Coordination Interventions:  Yes, provided   Follow up plan: Follow up call scheduled for 11/14    Encounter Outcome:  Patient Visit Completed   Kemper Durie RN, MSN, CCM [place]  [facility], [facility] RN Care Coordinator Direct Dial: [phone] / Main [phone] Fax [phone] Email: [email or web address]: [email or web address]

## 2023-05-11 NOTE — Plan of Care (Signed)
CHL Tonsillectomy/Adenoidectomy, Postoperative PEDS care plan entered in error.

## 2023-05-15 NOTE — Progress Notes (Unsigned)
Advanced Heart Failure Clinic Note   Referring Physician: PCP: [name], MD Cardiologist: None   HPI:  Leslie Parker is a 89 y/o female with a history of  Admitted 03/20/23 due to shortness of breath, weakness and pedal edema. Chest x-ray done today shows retrocardiac strandy opacities like atelectasis. IV lasix given. Completed course of antibiotics for pneumonia. CXR showed multifocal nodular opacity in the left lower lobe, possibly endobronchial. The largest nodule measures 11 mm. 41-month follow-up recommended to ensure resolution.   Admitted 04/30/23 due to shortness of breath and found to by hypoxic. Started on IV solumedrol and given IV magnesium for Mg of 1.2. Lasix had to be held due to rising creatinine. IVF given for continuing rise in creatinine. Renal ultrasound showing multiple bilateral renal cysts. Every other day furosemide resumed.   Echo 02/18/23: EF >55% with mild LVH, Grade I DD, mild LAE Echo 06/06/22: EF 60-65% with mild LVH, mild LAE, mild MR, mild/ moderate TR, mild Leslie Echo 02/13/23: EF 60-65% with moderate LVH, Grade I DD, mild MR  She presents today for her initial visit with a chief complaint of   Review of Systems: [y] = yes, [ ]  = no   General: Weight gain [ ] ; Weight loss [ ] ; Anorexia [ ] ; Fatigue [ ] ; Fever [ ] ; Chills [ ] ; Weakness [ ]   Cardiac: Chest pain/pressure [ ] ; Resting SOB [ ] ; Exertional SOB [ ] ; Orthopnea [ ] ; Pedal Edema [ ] ; Palpitations [ ] ; Syncope [ ] ; Presyncope [ ] ; Paroxysmal nocturnal dyspnea[ ]   Pulmonary: Cough [ ] ; Wheezing[ ] ; Hemoptysis[ ] ; Sputum [ ] ; Snoring [ ]   GI: Vomiting[ ] ; Dysphagia[ ] ; Melena[ ] ; Hematochezia [ ] ; Heartburn[ ] ; Abdominal pain [ ] ; Constipation [ ] ; Diarrhea [ ] ; BRBPR [ ]   GU: Hematuria[ ] ; Dysuria [ ] ; Nocturia[ ]   Vascular: Pain in legs with walking [ ] ; Pain in feet with lying flat [ ] ; Non-healing sores [ ] ; Stroke [ ] ; TIA [ ] ; Slurred speech [ ] ;  Neuro: Headaches[ ] ; Vertigo[ ] ; Seizures[ ] ;  Paresthesias[ ] ;Blurred vision [ ] ; Diplopia [ ] ; Vision changes [ ]   Ortho/Skin: Arthritis [ ] ; Joint pain [ ] ; Muscle pain [ ] ; Joint swelling [ ] ; Back Pain [ ] ; Rash [ ]   Psych: Depression[ ] ; Anxiety[ ]   Heme: Bleeding problems [ ] ; Clotting disorders [ ] ; Anemia [ ]   Endocrine: Diabetes [ ] ; Thyroid dysfunction[ ]    Past Medical History:  Diagnosis Date   Asthma    Atrial fibrillation with RVR (HCC)    Cancer (HCC)    Basal Cell   Diabetes mellitus without complication (HCC)    Heart murmur    Hypertension    Impetigo    Osteoarthritis    Osteopenia    Vomiting    can not due to surgery   Wears dentures    full upper and lower    Current Outpatient Medications  Medication Sig Dispense Refill   albuterol (VENTOLIN HFA) 108 (90 Base) MCG/ACT inhaler Inhale 2 puffs into the lungs every 6 (six) hours as needed.     amiodarone (PACERONE) 200 MG tablet Take 200 mg by mouth daily.     apixaban (ELIQUIS) 2.5 MG TABS tablet Take 1 tablet (2.5 mg total) by mouth 2 (two) times daily. 60 tablet 1   ascorbic acid (VITAMIN C) 250 MG CHEW Chew 250 mg by mouth daily.     bisacodyl (DULCOLAX) 10 MG suppository Place 1 suppository (10  mg total) rectally daily as needed for moderate constipation.     Cholecalciferol (VITAMIN D-1000 MAX ST) 25 MCG (1000 UT) tablet Take 1,000 Units by mouth daily.     colestipol (COLESTID) 1 g tablet Take 1 tablet by mouth 2 (two) times daily.     Cyanocobalamin (B-12) 2500 MCG TABS Take 2,500 mcg by mouth daily.     diltiazem (CARDIZEM CD) 360 MG 24 hr capsule Take 360 mg by mouth daily.     docusate sodium (COLACE) 100 MG capsule Take 1 capsule (100 mg total) by mouth 2 (two) times daily.     feeding supplement (ENSURE ENLIVE / ENSURE PLUS) LIQD Take 237 mLs by mouth 2 (two) times daily between meals.     furosemide (LASIX) 20 MG tablet Take 1 tablet (20 mg total) by mouth every other day. 30 tablet 0   glipiZIDE (GLUCOTROL XL) 2.5 MG 24 hr tablet Take 2.5  mg by mouth daily with breakfast.     ipratropium-albuterol (DUONEB) 0.5-2.5 (3) MG/3ML SOLN Inhale 3 mLs into the lungs every 6 (six) hours as needed.     Magnesium Oxide 500 MG CAPS Take 500 mg by mouth daily.     methocarbamol (ROBAXIN) 500 MG tablet Take 1 tablet (500 mg total) by mouth every 8 (eight) hours as needed for muscle spasms.     metoprolol tartrate (LOPRESSOR) 25 MG tablet Take 1 tablet (25 mg total) by mouth 2 (two) times daily.     Multiple Vitamin (MULTIVITAMIN WITH MINERALS) TABS tablet Take 1 tablet by mouth daily. One-A-Day Women's Vitamin     omeprazole (PRILOSEC) 20 MG capsule Take 20 mg by mouth every morning.     oxyCODONE-acetaminophen (PERCOCET/ROXICET) 5-325 MG tablet Take 1 tablet by mouth every 4 (four) hours as needed for moderate pain. 30 tablet 0   polyethylene glycol powder (MIRALAX) 17 GM/SCOOP powder 1 capful twice a day in 8 oz of juice or water until you have a bowel movement and then once a day every day 255 g 1   traZODone (DESYREL) 100 MG tablet Take 100 mg by mouth at bedtime.     TRELEGY ELLIPTA 100-62.5-25 MCG/ACT AEPB Inhale 1 puff into the lungs daily.     No current facility-administered medications for this visit.    Allergies  Allergen Reactions   Baclofen Other (See Comments)    Elevated Cr   Simvastatin Other (See Comments)    Pt denies  Elevated LFTs with simva 40mg , stopped and resolved (see MD note 11/10/13)      Social History   Socioeconomic History   Marital status: Divorced    Spouse name: Not on file   Number of children: Not on file   Years of education: Not on file   Highest education level: Not on file  Occupational History   Not on file  Tobacco Use   Smoking status: Never   Smokeless tobacco: Never  Vaping Use   Vaping status: Never Used  Substance and Sexual Activity   Alcohol use: No    Alcohol/week: 0.0 standard drinks of alcohol   Drug use: No   Sexual activity: Never  Other Topics Concern   Not on file   Social History Narrative   Not on file   Social Determinants of Health   Financial Resource Strain: Not on file  Food Insecurity: No Food Insecurity (05/01/2023)   Hunger Vital Sign    Worried About Running Out of Food in the Last Year: Never  true    Ran Out of Food in the Last Year: Never true  Transportation Needs: No Transportation Needs (05/01/2023)   PRAPARE - Administrator, Civil Service (Medical): No    Lack of Transportation (Non-Medical): No  Physical Activity: Not on file  Stress: Not on file  Social Connections: Not on file  Intimate Partner Violence: Not At Risk (05/01/2023)   Humiliation, Afraid, Rape, and Kick questionnaire    Fear of Current or Ex-Partner: No    Emotionally Abused: No    Physically Abused: No    Sexually Abused: No      Family History  Problem Relation Age of Onset   Heart failure Mother    Hypertension Mother    Emphysema Father        PHYSICAL EXAM: General:  Well appearing. No respiratory difficulty HEENT: normal Neck: supple. no JVD. No lymphadenopathy or thyromegaly appreciated. Cor: PMI nondisplaced. Regular rate & rhythm. No rubs, gallops or murmurs. Lungs: clear Abdomen: soft, nontender, nondistended. No hepatosplenomegaly. No bruits or masses.  Extremities: no cyanosis, clubbing, rash, edema Neuro: alert & oriented x 3, cranial nerves grossly intact. moves all 4 extremities w/o difficulty. Affect pleasant.  ECG:   ASSESSMENT & PLAN:  1: Chronic heart failure with preserved ejection fraction- - suspect due to - NYHA class - euvolemic - weighing daily - Echo 02/18/23: EF >55% with mild LVH, Grade I DD, mild LAE - Echo 06/06/22: EF 60-65% with mild LVH, mild LAE, mild MR, mild/ moderate TR, mild Leslie - Echo 02/13/23: EF 60-65% with moderate LVH, Grade I DD, mild MR - continue  - BNP  2: HTN- - BP - saw PCP - BMP  3:    Delma Freeze, FNP 05/15/23

## 2023-05-16 ENCOUNTER — Ambulatory Visit: Payer: Medicare HMO | Attending: Family | Admitting: Family

## 2023-05-16 ENCOUNTER — Encounter: Payer: Self-pay | Admitting: Family

## 2023-05-16 VITALS — BP 162/80 | HR 62 | Wt 180.0 lb

## 2023-05-16 DIAGNOSIS — Z950 Presence of cardiac pacemaker: Secondary | ICD-10-CM | POA: Insufficient documentation

## 2023-05-16 DIAGNOSIS — R0683 Snoring: Secondary | ICD-10-CM | POA: Diagnosis not present

## 2023-05-16 DIAGNOSIS — N179 Acute kidney failure, unspecified: Secondary | ICD-10-CM | POA: Diagnosis not present

## 2023-05-16 DIAGNOSIS — Z79899 Other long term (current) drug therapy: Secondary | ICD-10-CM | POA: Insufficient documentation

## 2023-05-16 DIAGNOSIS — N189 Chronic kidney disease, unspecified: Secondary | ICD-10-CM | POA: Diagnosis not present

## 2023-05-16 DIAGNOSIS — J449 Chronic obstructive pulmonary disease, unspecified: Secondary | ICD-10-CM

## 2023-05-16 DIAGNOSIS — I5032 Chronic diastolic (congestive) heart failure: Secondary | ICD-10-CM | POA: Diagnosis not present

## 2023-05-16 DIAGNOSIS — N184 Chronic kidney disease, stage 4 (severe): Secondary | ICD-10-CM | POA: Diagnosis not present

## 2023-05-16 DIAGNOSIS — I10 Essential (primary) hypertension: Secondary | ICD-10-CM

## 2023-05-16 DIAGNOSIS — I5033 Acute on chronic diastolic (congestive) heart failure: Secondary | ICD-10-CM | POA: Diagnosis not present

## 2023-05-16 DIAGNOSIS — R0602 Shortness of breath: Secondary | ICD-10-CM | POA: Insufficient documentation

## 2023-05-16 DIAGNOSIS — I130 Hypertensive heart and chronic kidney disease with heart failure and stage 1 through stage 4 chronic kidney disease, or unspecified chronic kidney disease: Secondary | ICD-10-CM | POA: Insufficient documentation

## 2023-05-16 DIAGNOSIS — Z7984 Long term (current) use of oral hypoglycemic drugs: Secondary | ICD-10-CM | POA: Diagnosis not present

## 2023-05-16 DIAGNOSIS — I482 Chronic atrial fibrillation, unspecified: Secondary | ICD-10-CM

## 2023-05-16 DIAGNOSIS — Z7901 Long term (current) use of anticoagulants: Secondary | ICD-10-CM | POA: Insufficient documentation

## 2023-05-16 DIAGNOSIS — J4489 Other specified chronic obstructive pulmonary disease: Secondary | ICD-10-CM | POA: Insufficient documentation

## 2023-05-16 DIAGNOSIS — J9601 Acute respiratory failure with hypoxia: Secondary | ICD-10-CM | POA: Diagnosis not present

## 2023-05-16 DIAGNOSIS — D631 Anemia in chronic kidney disease: Secondary | ICD-10-CM | POA: Diagnosis not present

## 2023-05-16 DIAGNOSIS — J441 Chronic obstructive pulmonary disease with (acute) exacerbation: Secondary | ICD-10-CM | POA: Diagnosis not present

## 2023-05-16 DIAGNOSIS — I428 Other cardiomyopathies: Secondary | ICD-10-CM | POA: Diagnosis not present

## 2023-05-16 DIAGNOSIS — E1122 Type 2 diabetes mellitus with diabetic chronic kidney disease: Secondary | ICD-10-CM | POA: Diagnosis not present

## 2023-05-16 DIAGNOSIS — I110 Hypertensive heart disease with heart failure: Secondary | ICD-10-CM | POA: Diagnosis not present

## 2023-05-16 DIAGNOSIS — R002 Palpitations: Secondary | ICD-10-CM | POA: Insufficient documentation

## 2023-05-16 DIAGNOSIS — I509 Heart failure, unspecified: Secondary | ICD-10-CM | POA: Diagnosis not present

## 2023-05-16 DIAGNOSIS — I480 Paroxysmal atrial fibrillation: Secondary | ICD-10-CM | POA: Insufficient documentation

## 2023-05-16 DIAGNOSIS — I5A Non-ischemic myocardial injury (non-traumatic): Secondary | ICD-10-CM | POA: Diagnosis not present

## 2023-05-16 NOTE — Patient Instructions (Signed)
Get compression socks and put them on every morning with removal at bedtime. Elevate legs when sitting for long periods of time.   Continue weighing daily and call for an overnight weight gain of 3 pounds or more or a weekly weight gain of more than 5 pounds.

## 2023-05-21 DIAGNOSIS — E119 Type 2 diabetes mellitus without complications: Secondary | ICD-10-CM | POA: Diagnosis not present

## 2023-05-21 DIAGNOSIS — R079 Chest pain, unspecified: Secondary | ICD-10-CM | POA: Diagnosis not present

## 2023-05-21 DIAGNOSIS — Z950 Presence of cardiac pacemaker: Secondary | ICD-10-CM | POA: Diagnosis not present

## 2023-05-21 DIAGNOSIS — I4891 Unspecified atrial fibrillation: Secondary | ICD-10-CM | POA: Diagnosis not present

## 2023-05-21 DIAGNOSIS — I10 Essential (primary) hypertension: Secondary | ICD-10-CM | POA: Diagnosis not present

## 2023-05-21 DIAGNOSIS — J449 Chronic obstructive pulmonary disease, unspecified: Secondary | ICD-10-CM | POA: Diagnosis not present

## 2023-05-21 DIAGNOSIS — I495 Sick sinus syndrome: Secondary | ICD-10-CM | POA: Diagnosis not present

## 2023-05-21 DIAGNOSIS — J4530 Mild persistent asthma, uncomplicated: Secondary | ICD-10-CM | POA: Diagnosis not present

## 2023-05-21 DIAGNOSIS — N1832 Chronic kidney disease, stage 3b: Secondary | ICD-10-CM | POA: Diagnosis not present

## 2023-05-22 DIAGNOSIS — I110 Hypertensive heart disease with heart failure: Secondary | ICD-10-CM | POA: Diagnosis not present

## 2023-05-22 DIAGNOSIS — D631 Anemia in chronic kidney disease: Secondary | ICD-10-CM | POA: Diagnosis not present

## 2023-05-22 DIAGNOSIS — N179 Acute kidney failure, unspecified: Secondary | ICD-10-CM | POA: Diagnosis not present

## 2023-05-22 DIAGNOSIS — I5033 Acute on chronic diastolic (congestive) heart failure: Secondary | ICD-10-CM | POA: Diagnosis not present

## 2023-05-22 DIAGNOSIS — N184 Chronic kidney disease, stage 4 (severe): Secondary | ICD-10-CM | POA: Diagnosis not present

## 2023-05-22 DIAGNOSIS — J9601 Acute respiratory failure with hypoxia: Secondary | ICD-10-CM | POA: Diagnosis not present

## 2023-05-22 DIAGNOSIS — I5A Non-ischemic myocardial injury (non-traumatic): Secondary | ICD-10-CM | POA: Diagnosis not present

## 2023-05-22 DIAGNOSIS — E1122 Type 2 diabetes mellitus with diabetic chronic kidney disease: Secondary | ICD-10-CM | POA: Diagnosis not present

## 2023-05-22 DIAGNOSIS — J441 Chronic obstructive pulmonary disease with (acute) exacerbation: Secondary | ICD-10-CM | POA: Diagnosis not present

## 2023-05-23 DIAGNOSIS — D631 Anemia in chronic kidney disease: Secondary | ICD-10-CM | POA: Diagnosis not present

## 2023-05-23 DIAGNOSIS — J9601 Acute respiratory failure with hypoxia: Secondary | ICD-10-CM | POA: Diagnosis not present

## 2023-05-23 DIAGNOSIS — E1122 Type 2 diabetes mellitus with diabetic chronic kidney disease: Secondary | ICD-10-CM | POA: Diagnosis not present

## 2023-05-23 DIAGNOSIS — N179 Acute kidney failure, unspecified: Secondary | ICD-10-CM | POA: Diagnosis not present

## 2023-05-23 DIAGNOSIS — I5A Non-ischemic myocardial injury (non-traumatic): Secondary | ICD-10-CM | POA: Diagnosis not present

## 2023-05-23 DIAGNOSIS — N184 Chronic kidney disease, stage 4 (severe): Secondary | ICD-10-CM | POA: Diagnosis not present

## 2023-05-23 DIAGNOSIS — I5033 Acute on chronic diastolic (congestive) heart failure: Secondary | ICD-10-CM | POA: Diagnosis not present

## 2023-05-23 DIAGNOSIS — J441 Chronic obstructive pulmonary disease with (acute) exacerbation: Secondary | ICD-10-CM | POA: Diagnosis not present

## 2023-05-23 DIAGNOSIS — I110 Hypertensive heart disease with heart failure: Secondary | ICD-10-CM | POA: Diagnosis not present

## 2023-05-28 DIAGNOSIS — N184 Chronic kidney disease, stage 4 (severe): Secondary | ICD-10-CM | POA: Diagnosis not present

## 2023-05-28 DIAGNOSIS — J441 Chronic obstructive pulmonary disease with (acute) exacerbation: Secondary | ICD-10-CM | POA: Diagnosis not present

## 2023-05-28 DIAGNOSIS — I495 Sick sinus syndrome: Secondary | ICD-10-CM | POA: Diagnosis not present

## 2023-05-28 DIAGNOSIS — I4891 Unspecified atrial fibrillation: Secondary | ICD-10-CM | POA: Diagnosis not present

## 2023-05-28 DIAGNOSIS — Z950 Presence of cardiac pacemaker: Secondary | ICD-10-CM | POA: Diagnosis not present

## 2023-05-28 DIAGNOSIS — Z09 Encounter for follow-up examination after completed treatment for conditions other than malignant neoplasm: Secondary | ICD-10-CM | POA: Diagnosis not present

## 2023-05-28 DIAGNOSIS — E1122 Type 2 diabetes mellitus with diabetic chronic kidney disease: Secondary | ICD-10-CM | POA: Diagnosis not present

## 2023-05-30 DIAGNOSIS — I110 Hypertensive heart disease with heart failure: Secondary | ICD-10-CM | POA: Diagnosis not present

## 2023-05-30 DIAGNOSIS — E1122 Type 2 diabetes mellitus with diabetic chronic kidney disease: Secondary | ICD-10-CM | POA: Diagnosis not present

## 2023-05-30 DIAGNOSIS — N179 Acute kidney failure, unspecified: Secondary | ICD-10-CM | POA: Diagnosis not present

## 2023-05-30 DIAGNOSIS — I5A Non-ischemic myocardial injury (non-traumatic): Secondary | ICD-10-CM | POA: Diagnosis not present

## 2023-05-30 DIAGNOSIS — N184 Chronic kidney disease, stage 4 (severe): Secondary | ICD-10-CM | POA: Diagnosis not present

## 2023-05-30 DIAGNOSIS — D631 Anemia in chronic kidney disease: Secondary | ICD-10-CM | POA: Diagnosis not present

## 2023-05-30 DIAGNOSIS — J9601 Acute respiratory failure with hypoxia: Secondary | ICD-10-CM | POA: Diagnosis not present

## 2023-05-30 DIAGNOSIS — I5033 Acute on chronic diastolic (congestive) heart failure: Secondary | ICD-10-CM | POA: Diagnosis not present

## 2023-05-30 DIAGNOSIS — J441 Chronic obstructive pulmonary disease with (acute) exacerbation: Secondary | ICD-10-CM | POA: Diagnosis not present

## 2023-06-04 ENCOUNTER — Inpatient Hospital Stay
Admission: EM | Admit: 2023-06-04 | Discharge: 2023-06-07 | DRG: 291 | Disposition: A | Discharge status: Home Health | Payer: Medicare HMO | Attending: Internal Medicine | Admitting: Internal Medicine

## 2023-06-04 ENCOUNTER — Emergency Department: Payer: Medicare HMO

## 2023-06-04 ENCOUNTER — Other Ambulatory Visit: Payer: Self-pay

## 2023-06-04 DIAGNOSIS — R001 Bradycardia, unspecified: Secondary | ICD-10-CM | POA: Diagnosis not present

## 2023-06-04 DIAGNOSIS — Z8249 Family history of ischemic heart disease and other diseases of the circulatory system: Secondary | ICD-10-CM | POA: Diagnosis not present

## 2023-06-04 DIAGNOSIS — N184 Chronic kidney disease, stage 4 (severe): Secondary | ICD-10-CM | POA: Diagnosis not present

## 2023-06-04 DIAGNOSIS — I509 Heart failure, unspecified: Secondary | ICD-10-CM | POA: Diagnosis not present

## 2023-06-04 DIAGNOSIS — Z1152 Encounter for screening for COVID-19: Secondary | ICD-10-CM | POA: Diagnosis not present

## 2023-06-04 DIAGNOSIS — I5033 Acute on chronic diastolic (congestive) heart failure: Secondary | ICD-10-CM | POA: Diagnosis not present

## 2023-06-04 DIAGNOSIS — Z96642 Presence of left artificial hip joint: Secondary | ICD-10-CM | POA: Diagnosis present

## 2023-06-04 DIAGNOSIS — J4489 Other specified chronic obstructive pulmonary disease: Secondary | ICD-10-CM | POA: Diagnosis not present

## 2023-06-04 DIAGNOSIS — D631 Anemia in chronic kidney disease: Secondary | ICD-10-CM | POA: Diagnosis not present

## 2023-06-04 DIAGNOSIS — R0602 Shortness of breath: Secondary | ICD-10-CM | POA: Diagnosis not present

## 2023-06-04 DIAGNOSIS — I482 Chronic atrial fibrillation, unspecified: Secondary | ICD-10-CM | POA: Diagnosis not present

## 2023-06-04 DIAGNOSIS — Z85828 Personal history of other malignant neoplasm of skin: Secondary | ICD-10-CM | POA: Diagnosis not present

## 2023-06-04 DIAGNOSIS — J449 Chronic obstructive pulmonary disease, unspecified: Secondary | ICD-10-CM | POA: Diagnosis present

## 2023-06-04 DIAGNOSIS — I10 Essential (primary) hypertension: Secondary | ICD-10-CM | POA: Diagnosis not present

## 2023-06-04 DIAGNOSIS — Z825 Family history of asthma and other chronic lower respiratory diseases: Secondary | ICD-10-CM

## 2023-06-04 DIAGNOSIS — N183 Type 2 diabetes mellitus with diabetic chronic kidney disease: Secondary | ICD-10-CM | POA: Diagnosis present

## 2023-06-04 DIAGNOSIS — Z79899 Other long term (current) drug therapy: Secondary | ICD-10-CM | POA: Diagnosis not present

## 2023-06-04 DIAGNOSIS — J441 Chronic obstructive pulmonary disease with (acute) exacerbation: Secondary | ICD-10-CM | POA: Diagnosis not present

## 2023-06-04 DIAGNOSIS — J9621 Acute and chronic respiratory failure with hypoxia: Secondary | ICD-10-CM | POA: Diagnosis not present

## 2023-06-04 DIAGNOSIS — E876 Hypokalemia: Secondary | ICD-10-CM | POA: Diagnosis present

## 2023-06-04 DIAGNOSIS — Z96611 Presence of right artificial shoulder joint: Secondary | ICD-10-CM | POA: Diagnosis present

## 2023-06-04 DIAGNOSIS — R0689 Other abnormalities of breathing: Secondary | ICD-10-CM | POA: Diagnosis not present

## 2023-06-04 DIAGNOSIS — E1129 Type 2 diabetes mellitus with other diabetic kidney complication: Secondary | ICD-10-CM | POA: Diagnosis present

## 2023-06-04 DIAGNOSIS — Z7984 Long term (current) use of oral hypoglycemic drugs: Secondary | ICD-10-CM

## 2023-06-04 DIAGNOSIS — Z7901 Long term (current) use of anticoagulants: Secondary | ICD-10-CM

## 2023-06-04 DIAGNOSIS — Z950 Presence of cardiac pacemaker: Secondary | ICD-10-CM | POA: Diagnosis not present

## 2023-06-04 DIAGNOSIS — J9601 Acute respiratory failure with hypoxia: Secondary | ICD-10-CM | POA: Diagnosis present

## 2023-06-04 DIAGNOSIS — R000 Tachycardia, unspecified: Secondary | ICD-10-CM | POA: Diagnosis not present

## 2023-06-04 DIAGNOSIS — Z96653 Presence of artificial knee joint, bilateral: Secondary | ICD-10-CM | POA: Diagnosis present

## 2023-06-04 DIAGNOSIS — Z888 Allergy status to other drugs, medicaments and biological substances status: Secondary | ICD-10-CM

## 2023-06-04 DIAGNOSIS — M8580 Other specified disorders of bone density and structure, unspecified site: Secondary | ICD-10-CM | POA: Diagnosis present

## 2023-06-04 DIAGNOSIS — I130 Hypertensive heart and chronic kidney disease with heart failure and stage 1 through stage 4 chronic kidney disease, or unspecified chronic kidney disease: Principal | ICD-10-CM | POA: Diagnosis present

## 2023-06-04 DIAGNOSIS — E1122 Type 2 diabetes mellitus with diabetic chronic kidney disease: Secondary | ICD-10-CM | POA: Diagnosis not present

## 2023-06-04 DIAGNOSIS — J9600 Acute respiratory failure, unspecified whether with hypoxia or hypercapnia: Secondary | ICD-10-CM | POA: Diagnosis not present

## 2023-06-04 DIAGNOSIS — N179 Acute kidney failure, unspecified: Secondary | ICD-10-CM | POA: Diagnosis not present

## 2023-06-04 DIAGNOSIS — N1832 Chronic kidney disease, stage 3b: Secondary | ICD-10-CM | POA: Diagnosis present

## 2023-06-04 DIAGNOSIS — I480 Paroxysmal atrial fibrillation: Secondary | ICD-10-CM | POA: Diagnosis not present

## 2023-06-04 DIAGNOSIS — I495 Sick sinus syndrome: Secondary | ICD-10-CM | POA: Diagnosis present

## 2023-06-04 DIAGNOSIS — I110 Hypertensive heart disease with heart failure: Principal | ICD-10-CM | POA: Diagnosis present

## 2023-06-04 DIAGNOSIS — Z7951 Long term (current) use of inhaled steroids: Secondary | ICD-10-CM

## 2023-06-04 DIAGNOSIS — E1169 Type 2 diabetes mellitus with other specified complication: Secondary | ICD-10-CM

## 2023-06-04 DIAGNOSIS — R069 Unspecified abnormalities of breathing: Secondary | ICD-10-CM | POA: Diagnosis not present

## 2023-06-04 DIAGNOSIS — J90 Pleural effusion, not elsewhere classified: Secondary | ICD-10-CM | POA: Diagnosis not present

## 2023-06-04 DIAGNOSIS — R918 Other nonspecific abnormal finding of lung field: Secondary | ICD-10-CM | POA: Diagnosis not present

## 2023-06-04 LAB — CBC WITH DIFFERENTIAL/PLATELET
Abs Immature Granulocytes: 0.03 10*3/uL (ref 0.00–0.07)
Basophils Absolute: 0 10*3/uL (ref 0.0–0.1)
Basophils Relative: 1 %
Eosinophils Absolute: 0 10*3/uL (ref 0.0–0.5)
Eosinophils Relative: 0 %
HCT: 40.5 % (ref 36.0–46.0)
Hemoglobin: 13 g/dL (ref 12.0–15.0)
Immature Granulocytes: 1 %
Lymphocytes Relative: 12 %
Lymphs Abs: 0.7 10*3/uL (ref 0.7–4.0)
MCH: 28.9 pg (ref 26.0–34.0)
MCHC: 32.1 g/dL (ref 30.0–36.0)
MCV: 90 fL (ref 80.0–100.0)
Monocytes Absolute: 0.3 10*3/uL (ref 0.1–1.0)
Monocytes Relative: 5 %
Neutro Abs: 4.5 10*3/uL (ref 1.7–7.7)
Neutrophils Relative %: 81 %
Platelets: 313 10*3/uL (ref 150–400)
RBC: 4.5 MIL/uL (ref 3.87–5.11)
RDW: 13.2 % (ref 11.5–15.5)
WBC: 5.5 10*3/uL (ref 4.0–10.5)
nRBC: 0 % (ref 0.0–0.2)

## 2023-06-04 LAB — BASIC METABOLIC PANEL
Anion gap: 9 (ref 5–15)
BUN: 32 mg/dL — ABNORMAL HIGH (ref 8–23)
CO2: 23 mmol/L (ref 22–32)
Calcium: 8.8 mg/dL — ABNORMAL LOW (ref 8.9–10.3)
Chloride: 101 mmol/L (ref 98–111)
Creatinine, Ser: 1.88 mg/dL — ABNORMAL HIGH (ref 0.44–1.00)
GFR, Estimated: 25 mL/min — ABNORMAL LOW (ref >60)
Glucose, Bld: 163 mg/dL — ABNORMAL HIGH (ref 70–99)
Potassium: 4.3 mmol/L (ref 3.5–5.1)
Sodium: 133 mmol/L — ABNORMAL LOW (ref 135–145)

## 2023-06-04 LAB — TROPONIN I (HIGH SENSITIVITY)
Troponin I (High Sensitivity): 17 ng/L (ref <18)
Troponin I (High Sensitivity): 16 ng/L (ref <18)

## 2023-06-04 LAB — BLOOD GAS, VENOUS
Acid-base deficit: 5.2 mmol/L — ABNORMAL HIGH (ref 0.0–2.0)
Bicarbonate: 20 mmol/L (ref 20.0–28.0)
O2 Saturation: 85.7 %
Patient temperature: 37
pCO2, Ven: 37 mm[Hg] — ABNORMAL LOW (ref 44–60)
pH, Ven: 7.34 (ref 7.25–7.43)
pO2, Ven: 54 mm[Hg] — ABNORMAL HIGH (ref 32–45)

## 2023-06-04 LAB — CBG MONITORING, ED: Glucose-Capillary: 133 mg/dL — ABNORMAL HIGH (ref 70–99)

## 2023-06-04 LAB — RESP PANEL BY RT-PCR (RSV, FLU A&B, COVID)  RVPGX2
Influenza A by PCR: NEGATIVE
Influenza B by PCR: NEGATIVE
Resp Syncytial Virus by PCR: NEGATIVE
SARS Coronavirus 2 by RT PCR: NEGATIVE

## 2023-06-04 LAB — PROCALCITONIN: Procalcitonin: <0.1 ng/mL

## 2023-06-04 LAB — BRAIN NATRIURETIC PEPTIDE: B Natriuretic Peptide: 1527.8 pg/mL — ABNORMAL HIGH (ref 0.0–100.0)

## 2023-06-04 MED ORDER — ONDANSETRON HCL 4 MG PO TABS: 4.0000 mg | ORAL_TABLET | Freq: Four times a day (QID) | ORAL | Status: DC | PRN

## 2023-06-04 MED ORDER — ONDANSETRON HCL 4 MG/2ML IJ SOLN: 4.0000 mg | Freq: Four times a day (QID) | INTRAMUSCULAR | Status: DC | PRN

## 2023-06-04 MED ORDER — FUROSEMIDE 10 MG/ML IJ SOLN
40.0000 mg | Freq: Two times a day (BID) | INTRAMUSCULAR | Status: DC
Start: 2023-06-04 — End: 2023-06-06
  Administered 2023-06-04 – 2023-06-06 (×4): 40 mg via INTRAVENOUS
  Filled 2023-06-04 (×4): qty 4

## 2023-06-04 MED ORDER — ENOXAPARIN SODIUM 30 MG/0.3ML IJ SOSY: 30.0000 mg | PREFILLED_SYRINGE | INTRAMUSCULAR | Status: DC

## 2023-06-04 MED ORDER — ENOXAPARIN SODIUM 40 MG/0.4ML IJ SOSY: 40.0000 mg | PREFILLED_SYRINGE | INTRAMUSCULAR | Status: DC

## 2023-06-04 MED ORDER — INSULIN ASPART 100 UNIT/ML IJ SOLN
0.0000 [IU] | Freq: Three times a day (TID) | INTRAMUSCULAR | Status: DC
  Administered 2023-06-04 – 2023-06-05 (×2): 1 [IU] via SUBCUTANEOUS
  Administered 2023-06-06: 2 [IU] via SUBCUTANEOUS
  Administered 2023-06-07: 1 [IU] via SUBCUTANEOUS
  Filled 2023-06-04 (×3): qty 1

## 2023-06-04 MED ORDER — FUROSEMIDE 10 MG/ML IJ SOLN
60.0000 mg | Freq: Once | INTRAMUSCULAR | Status: AC
  Administered 2023-06-04: 60 mg via INTRAVENOUS
  Filled 2023-06-04: qty 8

## 2023-06-04 MED ORDER — APIXABAN 2.5 MG PO TABS
2.5000 mg | ORAL_TABLET | Freq: Two times a day (BID) | ORAL | Status: DC
Start: 2023-06-04 — End: 2023-06-07
  Administered 2023-06-04 – 2023-06-07 (×6): 2.5 mg via ORAL
  Filled 2023-06-04 (×6): qty 1

## 2023-06-04 MED ORDER — AMIODARONE HCL 200 MG PO TABS
200.0000 mg | ORAL_TABLET | Freq: Every day | ORAL | Status: DC
  Administered 2023-06-04 – 2023-06-07 (×4): 200 mg via ORAL
  Filled 2023-06-04 (×4): qty 1

## 2023-06-04 NOTE — Assessment & Plan Note (Addendum)
2D echo July 2024 with EF of 60 to 65% and grade 1 diastolic dysfunction Volume overload today BNP 1530 with baseline around 3-400 8+ kilogram weight gain from May 04, 2023 discharge IV Lasix Strict ins and outs and daily weights Follow-up cardiology recommendations

## 2023-06-04 NOTE — ED Notes (Signed)
Helped pt, with EDT [place], roll side to side to change all linens and pads and check Pure Wick placement, brief applied

## 2023-06-04 NOTE — H&P (Addendum)
History and Physical    Patient: Leslie Parker ZOX:[id number] DOB: [birth year] DOA: 06/04/2023 DOS: the patient was seen and examined on 06/04/2023 PCP: [name], MD  Patient coming from: Home  Chief Complaint:  Chief Complaint  Patient presents with   Shortness of Breath   HPI: Leslie Parker is a 89 y.o. 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  Patient reports increased work of breathing over the past 2 to 3 days.  Positive orthopnea and PND.  Minimal cough or wheezing.  Using home inhaler with minimal improvement in symptoms.  Symptoms more predominant at night.  Has been compliant with home diuretic regimen.  Has had to sit in chair to help with breathing but no improvement.  No abdominal pain.  No diarrhea.  No focal hemiparesis or confusion.  Noted admission October 2024 for issues including acute on chronic CKD and respiratory failure with hypoxia COPD exacerbation. Presented to the ER afebrile, hemodynamically stable.  Transition to BiPAP to help with respiratory drive.  White count 5.5, hemoglobin 13, platelets 313, troponin negative x 2.  COVID flu and RSV negative.  Creatinine 1.9, glucose 163.  BNP of 1530.  Review of Systems: As mentioned in the history of present illness. All other systems reviewed and are negative. Past Medical History:  Diagnosis Date   Asthma    Atrial fibrillation with RVR (HCC)    Cancer (HCC)    Basal Cell   Diabetes mellitus without complication (HCC)    Heart murmur    Hypertension    Impetigo    Osteoarthritis    Osteopenia    Vomiting    can not due to surgery   Wears dentures    full upper and lower   Past Surgical History:  Procedure Laterality Date   ABDOMINAL HYSTERECTOMY     BACK  SURGERY     BLADDER SURGERY     mesh   CARPAL TUNNEL RELEASE Bilateral    CATARACT EXTRACTION Right 2017   CATARACT EXTRACTION W/PHACO Left 02/06/2016   Procedure: CATARACT EXTRACTION PHACO AND INTRAOCULAR LENS PLACEMENT (IOC) left eye;  Surgeon: Sherald Hess, MD;  Location: [facility];  Service: Ophthalmology;  Laterality: Left;  DIABETIC LEFT Cannot arrive before 9:30   CERVICAL DISC SURGERY     CHOLECYSTECTOMY     EYE SURGERY Bilateral    Cataract Extraction with IOL   FEMUR IM NAIL Left 02/04/2015   Procedure: INTRAMEDULLARY (IM) NAIL FEMORAL;  Surgeon: Juanell Fairly, MD;  Location: [facility];  Service: Orthopedics;  Laterality: Left;   HARDWARE REMOVAL Left 01/17/2017   Procedure: HARDWARE REMOVAL;  Surgeon: Juanell Fairly, MD;  Location: [facility];  Service: Orthopedics;  Laterality: Left;   HERNIA REPAIR  2014   esophageal and gastric mesh. patient unable to throw up d/t mesh   HIP ARTHROPLASTY Left 01/26/2017   Procedure: ARTHROPLASTY BIPOLAR HIP (HEMIARTHROPLASTY) removal hardware left hip;  Surgeon: Deeann Saint, MD;  Location: [facility];  Service: Orthopedics;  Laterality: Left;   INTRAMEDULLARY (IM) NAIL INTERTROCHANTERIC Right 02/13/2023   Procedure: INTRAMEDULLARY (IM) NAIL INTERTROCHANTERIC;  Surgeon: Christena Flake, MD;  Location: [facility];  Service: Orthopedics;  Laterality: Right;   JOINT REPLACEMENT Bilateral    knees   PACEMAKER IMPLANT N/A 11/15/2022   Procedure: PACEMAKER  IMPLANT;  Surgeon: Marcina Millard, MD;  Location: [facility];  Service: Cardiovascular;  Laterality: N/A;   PACEMAKER IMPLANT N/A 11/28/2022   Procedure: PACEMAKER IMPLANT;  Surgeon: Marcina Millard, MD;  Location: [facility];  Service: Cardiovascular;  Laterality: N/A;  Lead reposition   REPLACEMENT TOTAL KNEE BILATERAL Bilateral 1308,6578   SHOULDER ARTHROSCOPY WITH OPEN ROTATOR CUFF REPAIR AND DISTAL CLAVICLE ACROMINECTOMY Left 10/25/2016   Procedure:  SHOULDER ARTHROSCOPY WITH OPEN ROTATOR CUFF REPAIR AND DISTAL CLAVICLE ACROMINECTOMY;  Surgeon: Juanell Fairly, MD;  Location: [facility];  Service: Orthopedics;  Laterality: Left;   THYROID SURGERY     goiter removed   TOTAL HIP REVISION Left 12/02/2017   Procedure: TOTAL HIP REVISION;  Surgeon: Lyndle Herrlich, MD;  Location: [facility];  Service: Orthopedics;  Laterality: Left;   TOTAL SHOULDER REPLACEMENT Right 2012   Social History:  reports that she has never smoked. She has never used smokeless tobacco. She reports that she does not drink alcohol and does not use drugs.  Allergies  Allergen Reactions   Baclofen Other (See Comments)    Elevated Cr   Simvastatin Other (See Comments)    Pt denies  Elevated LFTs with simva 40mg , stopped and resolved (see MD note 11/10/13)    Family History  Problem Relation Age of Onset   Heart failure Mother    Hypertension Mother    Emphysema Father     Prior to Admission medications   Medication Sig Start Date End Date Taking? Authorizing Provider  hydrALAZINE (APRESOLINE) 25 MG tablet Take 25 mg by mouth 3 (three) times daily as needed. Take 1 tablet (25 mg total) by mouth 3 (three) times daily as needed (for SBP >150 conistently or DBP>100 persistently) 05/31/23 05/30/24 Yes [provider]  metoprolol tartrate (LOPRESSOR) 50 MG tablet Take 1 tablet by mouth 2 (two) times daily. 05/21/23 05/20/24 Yes [provider]  ondansetron (ZOFRAN-ODT) 4 MG disintegrating tablet Take 4 mg by mouth every 8 (eight) hours as needed. 05/21/23  Yes [provider]  albuterol (VENTOLIN HFA) 108 (90 Base) MCG/ACT inhaler Inhale 2 puffs into the lungs every 6 (six) hours as needed. 08/04/19   [provider]  amiodarone (PACERONE) 200 MG tablet Take 200 mg by mouth daily.    [provider]  apixaban (ELIQUIS) 2.5 MG TABS tablet Take 1 tablet (2.5 mg total) by mouth 2 (two) times daily. 06/07/22   Tresa Moore, MD   ascorbic acid (VITAMIN C) 250 MG CHEW Chew 250 mg by mouth daily.    [provider]  Cholecalciferol (VITAMIN D-1000 MAX ST) 25 MCG (1000 UT) tablet Take 1,000 Units by mouth daily.    [provider]  colestipol (COLESTID) 1 g tablet Take 1 tablet by mouth 2 (two) times daily. 06/01/22   [provider]  Cyanocobalamin (B-12) 2500 MCG TABS Take 2,500 mcg by mouth daily.    [provider]  diltiazem (CARDIZEM CD) 360 MG 24 hr capsule Take 360 mg by mouth daily. 11/23/22   [provider]  furosemide (LASIX) 20 MG tablet Take 1 tablet (20 mg total) by mouth every other day. 05/09/23   Enedina Finner, MD  glipiZIDE (GLUCOTROL XL) 2.5 MG 24 hr tablet Take 2.5 mg by mouth daily with breakfast. 03/27/22   [provider]  ipratropium-albuterol (DUONEB) 0.5-2.5 (3) MG/3ML SOLN Inhale 3 mLs into the lungs every 6 (six) hours as needed. 06/01/22   [provider]  Magnesium Oxide 500 MG CAPS Take 500 mg by mouth daily.    [provider]  metoprolol tartrate (LOPRESSOR) 25 MG tablet Take 1 tablet (25 mg total) by mouth 2 (two) times daily. 02/15/23   Sunnie Nielsen, DO  Multiple Vitamin (MULTIVITAMIN WITH MINERALS) TABS tablet Take 1 tablet by mouth daily. One-A-Day Women's Vitamin    [provider]  omeprazole (PRILOSEC) 20 MG capsule Take 20 mg by mouth every morning. 01/10/23   [provider]  traZODone (DESYREL) 100 MG tablet Take 100 mg by mouth at bedtime.    [provider]  TRELEGY ELLIPTA 100-62.5-25 MCG/ACT AEPB Inhale 1 puff into the lungs daily. 08/03/22   [provider]    Physical Exam: Vitals:   06/04/23 1457 06/04/23 1500 06/04/23 1530 06/04/23 1540  BP:  (!) 154/56 (!) 154/52 (!) 149/60  Pulse:  61 63 63  Resp:  17 15 18   Temp: (!) 96.9 F (36.1 C)     TempSrc: Axillary     SpO2:  98% 98% 100%  Weight:      Height:       Physical Exam Constitutional:      Appearance:  She is normal weight.     Comments: Bipap in place    HENT:     Head: Normocephalic.     Nose: Nose normal.     Mouth/Throat:     Mouth: Mucous membranes are moist.  Eyes:     Pupils: Pupils are equal, round, and reactive to light.  Cardiovascular:     Rate and Rhythm: Normal rate and regular rhythm.  Pulmonary:     Effort: Pulmonary effort is normal.  Abdominal:     General: Bowel sounds are normal.  Musculoskeletal:     Cervical back: Normal range of motion.     Right lower leg: Edema present.     Left lower leg: Edema present.  Skin:    General: Skin is warm.  Neurological:     General: No focal deficit present.  Psychiatric:        Mood and Affect: Mood normal.     Data Reviewed:  There are no new results to review at this time.   Lab Results  Component Value Date   WBC 5.5 06/04/2023   HGB 13.0 06/04/2023   HCT 40.5 06/04/2023   MCV 90.0 06/04/2023   PLT 313 06/04/2023   Last metabolic panel Lab Results  Component Value Date   GLUCOSE 163 (H) 06/04/2023   NA 133 (L) 06/04/2023   K 4.3 06/04/2023   CL 101 06/04/2023   CO2 23 06/04/2023   BUN 32 (H) 06/04/2023   CREATININE 1.88 (H) 06/04/2023   GFRNONAA 25 (L) 06/04/2023   CALCIUM 8.8 (L) 06/04/2023   PROT 7.1 04/30/2023   ALBUMIN 3.7 04/30/2023   BILITOT 1.1 04/30/2023   ALKPHOS 143 (H) 04/30/2023   AST 38 04/30/2023   ALT 29 04/30/2023   ANIONGAP 9 06/04/2023    Assessment and Plan: Acute respiratory failure with hypoxia (HCC) Decompensated respiratory failure requiring BiPAP in the setting of COPD, HFpEF Overall clinical presentation most consistent with volume overload Positive Rales on exam with 3+ pitting edema bilaterally BNP 1530 with baseline BNP around 300-400 8+ kilo gram weight gain from May 04, 2023 discharge Minimal wheezing on exam IV Lasix Cardiology consulted Follow-up recommendations Monitor respiratory status   COPD (chronic obstructive pulmonary disease)  (HCC) Decompensated respiratory failure on BiPAP with predominant component of volume  overload No significant wheezing on exam present Does not appear to be an active exacerbation Will continue as needed DuoNebs for now Reassess and escalate treatment as clinically appropriate Follow  Acute on chronic diastolic CHF (congestive heart failure) (HCC) 2D echo July 2024 with EF of 60 to 65% and grade 1 diastolic dysfunction Volume overload today BNP 1530 with baseline around 3-400 8+ kilogram weight gain from May 04, 2023 discharge IV Lasix Strict ins and outs and daily weights Follow-up cardiology recommendations  Chronic a-fib (HCC) Stable Continue Eliquis and rate controlling agents  Type II diabetes mellitus with renal manifestations (HCC) Blood sugar in 160s SSI Monitor  CKD stage 3 due to type 2 diabetes mellitus (HCC) Creatinine 1.9 with GFR in the 20s looks to be new baseline in review of recent renal function labs  Sick sinus syndrome [facility]) Status post pacemaker      Advance Care Planning:   Code Status: Full Code   Consults: Cardiology   Family Communication: No family at the bedside   Severity of Illness: The appropriate patient status for this patient is INPATIENT. Inpatient status is judged to be reasonable and necessary in order to provide the required intensity of service to ensure the patient's safety. The patient's presenting symptoms, physical exam findings, and initial radiographic and laboratory data in the context of their chronic comorbidities is felt to place them at high risk for further clinical deterioration. Furthermore, it is not anticipated that the patient will be medically stable for discharge from the hospital within 2 midnights of admission.   * I certify that at the point of admission it is my clinical judgment that the patient will require inpatient hospital care spanning beyond 2 midnights from the point of admission due to high intensity  of service, high risk for further deterioration and high frequency of surveillance required.*  Author: Floydene Flock, MD 06/04/2023 3:47 PM  For on call review [email or web address].

## 2023-06-04 NOTE — Assessment & Plan Note (Signed)
Decompensated respiratory failure on BiPAP with predominant component of volume overload No significant wheezing on exam present Does not appear to be an active exacerbation Will continue as needed DuoNebs for now Reassess and escalate treatment as clinically appropriate Follow

## 2023-06-04 NOTE — ED Provider Notes (Signed)
[facility] Provider Note    Event Date/Time   First MD Initiated Contact with Patient 06/04/23 1250     (approximate)   History   Shortness of Breath   HPI  Leslie Parker is a 89 y.o. female who presents to the emergency department today because of concerns for shortness of breath.  Started 2 nights ago.  The patient has been trying her inhalers at home without any significant relief.  Has been accompanied by a dry cough.  The patient denies any significant chest pain.  When EMS arrived the patient was in the high 80s on room air.  They did place the patient on CPAP.  Patient denies any fevers or chills.  No known sick contacts.     Physical Exam   Triage Vital Signs: ED Triage Vitals  Encounter Vitals Group     BP 06/04/23 1314 (!) 189/77     Systolic BP Percentile --      Diastolic BP Percentile --      Pulse Rate 06/04/23 1314 66     Resp 06/04/23 1314 (!) 24     Temp 06/04/23 1314 (!) 96.7 F (35.9 C)     Temp Source 06/04/23 1314 Axillary     SpO2 06/04/23 1314 98 %     Weight 06/04/23 1259 205 lb 3.2 oz (93.1 kg)     Height 06/04/23 1259 5\' 9"  (1.753 m)     Head Circumference --      Peak Flow --      Pain Score 06/04/23 1259 0     Pain Loc --      Pain Education --      Exclude from Growth Chart --     Most recent vital signs: Vitals:   06/04/23 1314  BP: (!) 189/77  Pulse: 66  Resp: (!) 24  Temp: (!) 96.7 F (35.9 C)  SpO2: 98%   General: Awake, alert, oriented. CV:  Good peripheral perfusion. Regular rate and rhythm. Resp:  Increased work of breathing, poor air movement diffusely. Diffuse expiratory wheezing. Abd:  No distention.  Ext:  Bilateral trace edema.   ED Results / Procedures / Treatments   Labs (all labs ordered are listed, but only abnormal results are displayed) Labs Reviewed  BASIC METABOLIC PANEL - Abnormal; Notable for the following components:      Result Value   Sodium 133 (*)    Glucose, Bld 163  (*)    BUN 32 (*)    Creatinine, Ser 1.88 (*)    Calcium 8.8 (*)    GFR, Estimated 25 (*)    All other components within normal limits  BRAIN NATRIURETIC PEPTIDE - Abnormal; Notable for the following components:   B Natriuretic Peptide 1,527.8 (*)    All other components within normal limits  RESP PANEL BY RT-PCR (RSV, FLU A&B, COVID)  [id number]  CBC WITH DIFFERENTIAL/PLATELET  TROPONIN I (HIGH SENSITIVITY)     EKG  I, Phineas Semen, attending physician, personally viewed and interpreted this EKG  EKG Time: 1314 Rate: 66 Rhythm: atrial fibrillation Axis: right axis deviation Intervals: qtc 517 QRS: nonspecific IVCD ST changes: no st elevation, t wave inversion II, III, aVF, V4, V5, V6 Impression: abnormal ekg   RADIOLOGY I independently interpreted and visualized the CXR. My interpretation: retrocardiac opacity Radiology interpretation: ***    PROCEDURES:  Critical Care performed: Yes  CRITICAL CARE Performed by: Phineas Semen   Total critical care time: *** minutes  Critical care time was exclusive of separately billable procedures and treating other patients.  Critical care was necessary to treat or prevent imminent or life-threatening deterioration.  Critical care was time spent personally by me on the following activities: development of treatment plan with patient and/or surrogate as well as nursing, discussions with consultants, evaluation of patient's response to treatment, examination of patient, obtaining history from patient or surrogate, ordering and performing treatments and interventions, ordering and review of laboratory studies, ordering and review of radiographic studies, pulse oximetry and re-evaluation of patient's condition.   Procedures    MEDICATIONS ORDERED IN ED: Medications - No data to display   IMPRESSION / MDM / ASSESSMENT AND PLAN / ED COURSE  I reviewed the triage vital signs and the nursing notes.                               Differential diagnosis includes, but is not limited to, ***  Patient's presentation is most consistent with {EM COPA:27473}   ***The patient is on the cardiac monitor to evaluate for evidence of arrhythmia and/or significant heart rate changes.  ***      FINAL CLINICAL IMPRESSION(S) / ED DIAGNOSES   Final diagnoses:  None     Rx / DC Orders   ED Discharge Orders     None        Note:  This document was prepared using Dragon voice recognition software and may include unintentional dictation errors.

## 2023-06-04 NOTE — Assessment & Plan Note (Signed)
Status post pacemaker 

## 2023-06-04 NOTE — Progress Notes (Signed)
PHARMACIST - PHYSICIAN COMMUNICATION  CONCERNING:  Enoxaparin (Lovenox) for DVT Prophylaxis    RECOMMENDATION: Patient was prescribed enoxaprin 40mg  q24 hours for VTE prophylaxis.   Filed Weights   06/04/23 1259  Weight: 93.1 kg (205 lb 3.2 oz)    Body mass index is 30.3 kg/m.  Estimated Creatinine Clearance: 25.1 mL/min (A) (by C-G formula based on SCr of 1.88 mg/dL (H)).   Based on [facility] policy   Patient is candidate for enoxaparin 30mg  every 24 hours based on CrCl <5ml/min   DESCRIPTION: Pharmacy has adjusted enoxaparin dose per [facility] policy.  Patient is now receiving enoxaparin 30 mg every 24 hours    Lowella Bandy, PharmD Clinical Pharmacist  06/04/2023 3:30 PM

## 2023-06-04 NOTE — ED Notes (Addendum)
Resumed care from meagan rn.  Pt alert.  Pt on bipap.  Pt denies any pain.  Pt waiting on admission bed.   Nsr on monitor.  Purewick in place.  Iv in place.    RT in with pt now to remove bipap and place pt on 3 liters [place].

## 2023-06-04 NOTE — Assessment & Plan Note (Signed)
Stable Continue Eliquis and rate controlling agents

## 2023-06-04 NOTE — Progress Notes (Signed)
Pt taken off bipap and placed on 3L [place], tolerating well, RN aware.

## 2023-06-04 NOTE — Assessment & Plan Note (Signed)
Creatinine 1.9 with GFR in the 20s looks to be new baseline in review of recent renal function labs. Slight improvement today. -Monitor renal function closely while she is being diuresed -Avoid nephrotoxins

## 2023-06-04 NOTE — Assessment & Plan Note (Addendum)
Decompensated respiratory failure requiring BiPAP in the setting of COPD, HFpEF Overall clinical presentation most consistent with volume overload Positive Rales on exam with 3+ pitting edema bilaterally BNP 1530 with baseline BNP around 300-400 8+ kilo gram weight gain from May 04, 2023 discharge Minimal wheezing on exam IV Lasix Cardiology consulted Follow-up recommendations Monitor respiratory status

## 2023-06-04 NOTE — ED Triage Notes (Signed)
Patient comes from home via [facility] with complaints of SOB the past couple of days. According to EMS, the patient tried using her inhaler with no relief. Pt states that her breathing got worse last night over into this morning. She was sitting in her chair this morning, and still couldn't catch her breath. Pt is alert and oriented x4, and currently on CPAP via EMS.

## 2023-06-04 NOTE — ED Notes (Signed)
Fs bs 133

## 2023-06-04 NOTE — Consult Note (Signed)
[facility] CLINIC CARDIOLOGY CONSULT NOTE       Patient ID: [name] MRN: [id number] DOB/AGE: Jul 15, 1933 89 y.o.  Admit date: 06/04/2023 Referring Physician Dr. Doree Albee Primary Physician [name], MD  Primary Cardiologist Dr. Dorothyann Peng Reason for Consultation AoCHF  HPI: Leslie Parker is a 89 y.o. female  with a past medical history of chronic diastolic heart failure, sick sinus syndrome s/p [employer] DC PPM implant 11/15/2022, paroxysmal AF (eliquis), DM2, COPD who presented to the ED on 06/04/2023 for shortness of breath. Cardiology was consulted for further evaluation.   Patient reports that she has had shortness of breath for "a while".  She reports that last night she noticed worse dyspnea on exertion walking short distances throughout her house.  States that overnight she had difficulty sleeping because anytime she would roll over in bed she became short of breath.  Also endorses orthopnea.  Reports that when she woke up this morning she was resting at home until someone came to her house, when walking through her house to answer the door she became significantly short of breath.  Reports that she was gasping for air.  She tried a breathing treatment which did not help her symptoms and she decided to call EMS.  She was brought to the ED for further evaluation.  Workup in the ED notable for creatinine 1.88, potassium 4.3, sodium 133, hemoglobin 13.0, WBC 5.5.  BNP elevated at 1500.  Troponins negative x 2 at 17 > 16.  EKG demonstrated sinus rhythm, stable from prior.  CXR with possible right pleural effusion.  Initially was on BiPAP but has been weaned to 2L [place].  She was started on IV Lasix in the ED.   At the time of my evaluation this afternoon the patient is resting comfortably in ED stretcher.  We discussed her shortness of breath at length.  She reports that overall she feels some better now than she did yesterday and this morning.  Still has some  conversational dyspnea.  Also reports lower extremity edema which has been worsening recently.  Denies any chest pain, palpitations, dizziness or syncope.  Review of systems complete and found to be negative unless listed above    Past Medical History:  Diagnosis Date   Asthma    Atrial fibrillation with RVR (HCC)    Cancer (HCC)    Basal Cell   Diabetes mellitus without complication (HCC)    Heart murmur    Hypertension    Impetigo    Osteoarthritis    Osteopenia    Vomiting    can not due to surgery   Wears dentures    full upper and lower    Past Surgical History:  Procedure Laterality Date   ABDOMINAL HYSTERECTOMY     BACK SURGERY     BLADDER SURGERY     mesh   CARPAL TUNNEL RELEASE Bilateral    CATARACT EXTRACTION Right 2017   CATARACT EXTRACTION W/PHACO Left 02/06/2016   Procedure: CATARACT EXTRACTION PHACO AND INTRAOCULAR LENS PLACEMENT (IOC) left eye;  Surgeon: Sherald Hess, MD;  Location: [facility];  Service: Ophthalmology;  Laterality: Left;  DIABETIC LEFT Cannot arrive before 9:30   CERVICAL DISC SURGERY     CHOLECYSTECTOMY     EYE SURGERY Bilateral    Cataract Extraction with IOL   FEMUR IM NAIL Left 02/04/2015   Procedure: INTRAMEDULLARY (IM) NAIL FEMORAL;  Surgeon: Juanell Fairly, MD;  Location: [facility];  Service: Orthopedics;  Laterality:  Left;   HARDWARE REMOVAL Left 01/17/2017   Procedure: HARDWARE REMOVAL;  Surgeon: Juanell Fairly, MD;  Location: [facility];  Service: Orthopedics;  Laterality: Left;   HERNIA REPAIR  2014   esophageal and gastric mesh. patient unable to throw up d/t mesh   HIP ARTHROPLASTY Left 01/26/2017   Procedure: ARTHROPLASTY BIPOLAR HIP (HEMIARTHROPLASTY) removal hardware left hip;  Surgeon: Deeann Saint, MD;  Location: [facility];  Service: Orthopedics;  Laterality: Left;   INTRAMEDULLARY (IM) NAIL INTERTROCHANTERIC Right 02/13/2023   Procedure: INTRAMEDULLARY (IM) NAIL INTERTROCHANTERIC;  Surgeon: Christena Flake, MD;  Location: [facility];  Service: Orthopedics;  Laterality: Right;   JOINT REPLACEMENT Bilateral    knees   PACEMAKER IMPLANT N/A 11/15/2022   Procedure: PACEMAKER IMPLANT;  Surgeon: Marcina Millard, MD;  Location: [facility];  Service: Cardiovascular;  Laterality: N/A;   PACEMAKER IMPLANT N/A 11/28/2022   Procedure: PACEMAKER IMPLANT;  Surgeon: Marcina Millard, MD;  Location: [facility];  Service: Cardiovascular;  Laterality: N/A;  Lead reposition   REPLACEMENT TOTAL KNEE BILATERAL Bilateral 8413,2440   SHOULDER ARTHROSCOPY WITH OPEN ROTATOR CUFF REPAIR AND DISTAL CLAVICLE ACROMINECTOMY Left 10/25/2016   Procedure: SHOULDER ARTHROSCOPY WITH OPEN ROTATOR CUFF REPAIR AND DISTAL CLAVICLE ACROMINECTOMY;  Surgeon: Juanell Fairly, MD;  Location: [facility];  Service: Orthopedics;  Laterality: Left;   THYROID SURGERY     goiter removed   TOTAL HIP REVISION Left 12/02/2017   Procedure: TOTAL HIP REVISION;  Surgeon: Lyndle Herrlich, MD;  Location: [facility];  Service: Orthopedics;  Laterality: Left;   TOTAL SHOULDER REPLACEMENT Right 2012    (Not in a hospital admission)  Social History   Socioeconomic History   Marital status: Divorced    Spouse name: Not on file   Number of children: Not on file   Years of education: Not on file   Highest education level: Not on file  Occupational History   Not on file  Tobacco Use   Smoking status: Never   Smokeless tobacco: Never  Vaping Use   Vaping status: Never Used  Substance and Sexual Activity   Alcohol use: No    Alcohol/week: 0.0 standard drinks of alcohol   Drug use: No   Sexual activity: Never  Other Topics Concern   Not on file  Social History Narrative   Not on file   Social Determinants of Health   Financial Resource Strain: Not on file  Food Insecurity: No Food Insecurity (05/01/2023)   Hunger Vital Sign    Worried About Running Out of Food in the Last Year: Never true    Ran Out of Food in the Last  Year: Never true  Transportation Needs: No Transportation Needs (05/01/2023)   PRAPARE - Administrator, Civil Service (Medical): No    Lack of Transportation (Non-Medical): No  Physical Activity: Not on file  Stress: Not on file  Social Connections: Not on file  Intimate Partner Violence: Not At Risk (05/01/2023)   Humiliation, Afraid, Rape, and Kick questionnaire    Fear of Current or Ex-Partner: No    Emotionally Abused: No    Physically Abused: No    Sexually Abused: No    Family History  Problem Relation Age of Onset   Heart failure Mother    Hypertension Mother    Emphysema Father      Vitals:   06/04/23 1500 06/04/23 1530 06/04/23 1540 06/04/23 1630  BP: (!) 154/56 (!) 154/52 (!) 149/60 (!) 166/76  Pulse: 61 63 63 69  Resp: 17 15 18  (!) 24  Temp:      TempSrc:      SpO2: 98% 98% 100% 96%  Weight:      Height:        PHYSICAL EXAM General: Ill-appearing elderly female, well nourished, in no acute distress. HEENT: Normocephalic and atraumatic. Neck: No JVD.  Lungs: Normal respiratory effort on 2L [place]. Clear bilaterally to auscultation. + Rhonchi. Heart: HRRR. Normal S1 and S2 without gallops or murmurs.  Abdomen: Non-distended appearing.  Msk: Normal strength and tone for age. Extremities: Warm and well perfused. No clubbing, cyanosis. 2+ pitting edema bilaterally.  Neuro: Alert and oriented X 3. Psych: Answers questions appropriately.   Labs: Basic Metabolic Panel: Recent Labs    06/04/23 1308  NA 133*  K 4.3  CL 101  CO2 23  GLUCOSE 163*  BUN 32*  CREATININE 1.88*  CALCIUM 8.8*   Liver Function Tests: No results for input(s): "AST", "ALT", "ALKPHOS", "BILITOT", "PROT", "ALBUMIN" in the last 72 hours. No results for input(s): "LIPASE", "AMYLASE" in the last 72 hours. CBC: Recent Labs    06/04/23 1308  WBC 5.5  NEUTROABS 4.5  HGB 13.0  HCT 40.5  MCV 90.0  PLT 313   Cardiac Enzymes: Recent Labs    06/04/23 1308 06/04/23 1457   TROPONINIHS 17 16   BNP: Recent Labs    06/04/23 1308  BNP 1,527.8*   D-Dimer: No results for input(s): "DDIMER" in the last 72 hours. Hemoglobin A1C: No results for input(s): "HGBA1C" in the last 72 hours. Fasting Lipid Panel: No results for input(s): "CHOL", "HDL", "LDLCALC", "TRIG", "CHOLHDL", "LDLDIRECT" in the last 72 hours. Thyroid Function Tests: No results for input(s): "TSH", "T4TOTAL", "T3FREE", "THYROIDAB" in the last 72 hours.  Invalid input(s): "FREET3" Anemia Panel: No results for input(s): "VITAMINB12", "FOLATE", "FERRITIN", "TIBC", "IRON", "RETICCTPCT" in the last 72 hours.   Radiology: DG Chest Portable 1 View  Result Date: 06/04/2023 CLINICAL DATA:  Shortness of breath EXAM: PORTABLE CHEST 1 VIEW COMPARISON:  Chest x-ray 04/30/2023 FINDINGS: Left-sided pacemaker is again seen. The heart is enlarged, unchanged. There is new elevation of the right hemidiaphragm versus moderate right pleural effusion. There is a small left pleural effusion. There are patchy opacities in both lower lungs, left greater than right. There is no pneumothorax or acute fracture. Right shoulder arthroplasty is again seen. IMPRESSION: 1. New elevation of the right hemidiaphragm versus moderate right pleural effusion. 2. Small left pleural effusion. 3. Patchy opacities in both lower lungs, left greater than right, which may represent atelectasis or infection. Electronically Signed   By: Darliss Cheney M.D.   On: 06/04/2023 16:44   US RENAL  Result Date: 05/06/2023 CLINICAL DATA:  Acute kidney injury. EXAM: RENAL / URINARY TRACT ULTRASOUND COMPLETE COMPARISON:  Dec 04, 2020. FINDINGS: Right Kidney: Renal measurements: 11.9 x 6.6 x 5.7 cm = volume: 233 mL. Multiple cysts are noted, the largest measuring 9.5 cm. Echogenicity within normal limits. No mass or hydronephrosis visualized. Left Kidney: Renal measurements: 11.3 x 5.2 x 5.2 cm = volume: 158 mL. Multiple cysts are noted, the largest measuring  4.8 cm. Echogenicity within normal limits. No mass or hydronephrosis visualized. Bladder: Appears normal for degree of bladder distention. Other: None. IMPRESSION: Multiple bilateral renal cysts. No hydronephrosis or renal obstruction is noted. Electronically Signed   By: Lupita Raider M.D.   On: 05/06/2023 20:26    ECHO 01/2023: 1. Left ventricular ejection fraction,  by estimation, is 60 to 65%. The left ventricle has normal function. The left ventricle has no regional wall motion abnormalities. There is moderate concentric left ventricular hypertrophy. Left ventricular diastolic parameters are consistent with Grade I diastolic dysfunction (impaired relaxation).   2. Right ventricular systolic function is normal. The right ventricular size is normal.   3. The mitral valve is grossly normal. Mild mitral valve regurgitation.   4. The aortic valve is calcified. Aortic valve regurgitation is trivial. Aortic valve sclerosis/calcification is present, without any evidence of aortic stenosis.   TELEMETRY reviewed by me 06/04/2023: Sinus rhythm rate 60s  EKG reviewed by me: Sinus rhythm rate 66 bpm  Data reviewed by me 06/04/2023: last 24h vitals tele labs imaging I/O ED provider note, admission H&P  Principal Problem:   Acute on chronic respiratory failure with hypoxia (HCC) Active Problems:   Acute respiratory failure with hypoxia (HCC)   Chronic a-fib (HCC)   COPD (chronic obstructive pulmonary disease) (HCC)   Acute on chronic diastolic CHF (congestive heart failure) (HCC)   Sick sinus syndrome (HCC)   CKD stage 3 due to type 2 diabetes mellitus (HCC)   Type II diabetes mellitus with renal manifestations (HCC)    ASSESSMENT AND PLAN:  CHRISTEE WERTZBERGER is a 89 y.o. female  with a past medical history of chronic diastolic heart failure, sick sinus syndrome s/p [employer] DC PPM implant 11/15/2022, paroxysmal AF (eliquis), DM2, COPD who presented to the ED on 06/04/2023 for shortness of  breath. Cardiology was consulted for further evaluation.   # Acute on chronic HFpEF # Acute hypoxic respiratory failure # Hypertension Patient presenting with progressively worsening shortness of breath, orthopnea, lower extremity edema.  Initially required BiPAP now weaned to 2 L [place].  BNP 1500, CXR with probable R pleural effusion.  S/p IV Lasix in the ED. -Continue IV Lasix 40 mg twice daily.  -Consider addition of SGLT2i if renal function will tolerate.  # SSS s/p DC PPM 10/2022 # Paroxysmal atrial fibrillation Device appears to be functioning well, no evidence of atrial fibrillation on telemetry. Patient without complaints of palpitations, heart racing, dizziness.  -Continue Eliquis 2.5 mg twice daily for stroke risk reduction. -Continue home amiodarone 200 mg daily for rhythm control. -Home rate control meds currently held.  # Chronic kidney disease stage IIIb # Hypokalemia Creatinine 1.88 on admission. -Continue to monitor renal function closely with diuresis. -Monitor and replenish electrolytes for a goal K >4, Mag >2    This patient's plan of care was discussed and created with Dr. Juliann Pares and he is in agreement.  Signed: Gale Journey, PA-C  06/04/2023, 5:24 PM [facility] Card[facility]

## 2023-06-04 NOTE — Assessment & Plan Note (Signed)
Blood sugar in 160s, patient was on glipizide at home SSI Monitor

## 2023-06-05 ENCOUNTER — Encounter: Payer: Self-pay | Admitting: Family Medicine

## 2023-06-05 DIAGNOSIS — E1122 Type 2 diabetes mellitus with diabetic chronic kidney disease: Secondary | ICD-10-CM

## 2023-06-05 DIAGNOSIS — I495 Sick sinus syndrome: Secondary | ICD-10-CM

## 2023-06-05 DIAGNOSIS — J9621 Acute and chronic respiratory failure with hypoxia: Secondary | ICD-10-CM | POA: Diagnosis not present

## 2023-06-05 DIAGNOSIS — N1832 Chronic kidney disease, stage 3b: Secondary | ICD-10-CM

## 2023-06-05 DIAGNOSIS — J449 Chronic obstructive pulmonary disease, unspecified: Secondary | ICD-10-CM | POA: Diagnosis not present

## 2023-06-05 DIAGNOSIS — N183 Chronic kidney disease, stage 3 unspecified: Secondary | ICD-10-CM

## 2023-06-05 DIAGNOSIS — I5033 Acute on chronic diastolic (congestive) heart failure: Secondary | ICD-10-CM | POA: Diagnosis not present

## 2023-06-05 LAB — COMPREHENSIVE METABOLIC PANEL
ALT: 19 U/L (ref 0–44)
AST: 21 U/L (ref 15–41)
Albumin: 2.4 g/dL — ABNORMAL LOW (ref 3.5–5.0)
Alkaline Phosphatase: 82 U/L (ref 38–126)
Anion gap: 10 (ref 5–15)
BUN: 30 mg/dL — ABNORMAL HIGH (ref 8–23)
CO2: 24 mmol/L (ref 22–32)
Calcium: 7.7 mg/dL — ABNORMAL LOW (ref 8.9–10.3)
Chloride: 103 mmol/L (ref 98–111)
Creatinine, Ser: 1.78 mg/dL — ABNORMAL HIGH (ref 0.44–1.00)
GFR, Estimated: 27 mL/min — ABNORMAL LOW (ref >60)
Glucose, Bld: 98 mg/dL (ref 70–99)
Potassium: 3.6 mmol/L (ref 3.5–5.1)
Sodium: 137 mmol/L (ref 135–145)
Total Bilirubin: 0.3 mg/dL (ref <1.2)
Total Protein: 5.1 g/dL — ABNORMAL LOW (ref 6.5–8.1)

## 2023-06-05 LAB — CBC
HCT: 33.2 % — ABNORMAL LOW (ref 36.0–46.0)
Hemoglobin: 10.7 g/dL — ABNORMAL LOW (ref 12.0–15.0)
MCH: 28.9 pg (ref 26.0–34.0)
MCHC: 32.2 g/dL (ref 30.0–36.0)
MCV: 89.7 fL (ref 80.0–100.0)
Platelets: 280 10*3/uL (ref 150–400)
RBC: 3.7 MIL/uL — ABNORMAL LOW (ref 3.87–5.11)
RDW: 13.4 % (ref 11.5–15.5)
WBC: 5.5 10*3/uL (ref 4.0–10.5)
nRBC: 0 % (ref 0.0–0.2)

## 2023-06-05 LAB — CBG MONITORING, ED
Glucose-Capillary: 102 mg/dL — ABNORMAL HIGH (ref 70–99)
Glucose-Capillary: 110 mg/dL — ABNORMAL HIGH (ref 70–99)

## 2023-06-05 LAB — GLUCOSE, CAPILLARY
Glucose-Capillary: 139 mg/dL — ABNORMAL HIGH (ref 70–99)
Glucose-Capillary: 142 mg/dL — ABNORMAL HIGH (ref 70–99)

## 2023-06-05 MED ORDER — UMECLIDINIUM BROMIDE 62.5 MCG/ACT IN AEPB
1.0000 | INHALATION_SPRAY | Freq: Every day | RESPIRATORY_TRACT | Status: DC
  Administered 2023-06-05 – 2023-06-07 (×3): 1 via RESPIRATORY_TRACT
  Filled 2023-06-05: qty 7

## 2023-06-05 MED ORDER — METOPROLOL TARTRATE 50 MG PO TABS
50.0000 mg | ORAL_TABLET | Freq: Two times a day (BID) | ORAL | Status: DC
  Administered 2023-06-05 – 2023-06-07 (×5): 50 mg via ORAL
  Filled 2023-06-05 (×5): qty 1

## 2023-06-05 MED ORDER — DILTIAZEM HCL ER COATED BEADS 180 MG PO CP24
360.0000 mg | ORAL_CAPSULE | Freq: Every day | ORAL | Status: DC
  Administered 2023-06-05 – 2023-06-07 (×3): 360 mg via ORAL
  Filled 2023-06-05 (×3): qty 2

## 2023-06-05 MED ORDER — VITAMIN B-12 1000 MCG PO TABS
2500.0000 ug | ORAL_TABLET | Freq: Every day | ORAL | Status: DC
  Administered 2023-06-05 – 2023-06-07 (×3): 2500 ug via ORAL
  Filled 2023-06-05 (×3): qty 3

## 2023-06-05 MED ORDER — PANTOPRAZOLE SODIUM 40 MG PO TBEC
40.0000 mg | DELAYED_RELEASE_TABLET | Freq: Every day | ORAL | Status: DC
  Administered 2023-06-05 – 2023-06-07 (×3): 40 mg via ORAL
  Filled 2023-06-05 (×3): qty 1

## 2023-06-05 MED ORDER — TRAZODONE HCL 100 MG PO TABS
100.0000 mg | ORAL_TABLET | Freq: Every day | ORAL | Status: DC
  Administered 2023-06-05 – 2023-06-06 (×2): 100 mg via ORAL
  Filled 2023-06-05 (×2): qty 1

## 2023-06-05 MED ORDER — FLUTICASONE FUROATE-VILANTEROL 100-25 MCG/ACT IN AEPB
1.0000 | INHALATION_SPRAY | Freq: Every day | RESPIRATORY_TRACT | Status: DC
  Administered 2023-06-05 – 2023-06-07 (×3): 1 via RESPIRATORY_TRACT
  Filled 2023-06-05: qty 28

## 2023-06-05 NOTE — [place] FL2 (Signed)
[place] MEDICAID FL2 LEVEL OF CARE FORM     IDENTIFICATION  Patient Name: Leslie Parker Birthdate: 11-08-1932 Sex: female Admission Date (Current Location): 06/04/2023  [facility] and [place] Number:  Chiropodist and Address:  [facility], [address] [place], [place] [address]      Provider Number: [id number]  Attending Physician Name and Address:  Arnetha Courser, MD  Relative Name and Phone Number:  [name]  [phone]    Current Level of Care: Hospital Recommended Level of Care: Skilled Nursing Facility Prior Approval Number:    Date Approved/Denied:   PASRR Number: [id number]  Discharge Plan: SNF    Current Diagnoses: Patient Active Problem List   Diagnosis Date Noted   Acute on chronic respiratory failure with hypoxia (HCC) 06/04/2023   Hyponatremia 05/05/2023   Acute delirium 05/04/2023   Myocardial injury 05/01/2023   Overweight (BMI 25.0-29.9) 05/01/2023   Left hip pain 05/01/2023   Hypokalemia 04/30/2023   Anemia 03/21/2023   Abnormal LFTs 03/20/2023   Closed right hip fracture (HCC) 02/13/2023   Fall at home, initial encounter 02/13/2023   Type II diabetes mellitus with renal manifestations (HCC) 02/13/2023   Chronic diastolic CHF (congestive heart failure) (HCC) 02/13/2023   Leukocytosis 02/13/2023   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   Acute kidney injury superimposed on CKD (HCC) 11/24/2022   CKD stage 3 due to type 2 diabetes mellitus (HCC) 11/24/2022   UTI (urinary tract infection) 11/24/2022   Bradycardia 11/24/2022   Paroxysmal atrial fibrillation (HCC) 11/24/2022   Sick sinus syndrome (HCC) 11/15/2022   Acute on chronic diastolic CHF (congestive heart failure) (HCC) 08/27/2022   COPD (chronic obstructive pulmonary disease) (HCC) 08/23/2022   Chronic a-fib (HCC) 08/22/2022   Respiratory distress 08/22/2022   Atrial fibrillation with rapid ventricular response (HCC) 06/06/2022   Rapid atrial  fibrillation, new onset(HCC) 06/05/2022   Diabetes mellitus without complication (HCC)    Stage 3b chronic kidney disease (HCC)    Lactic acidosis    Postural dizziness with presyncope    Community acquired pneumonia    Hypomagnesemia    Sepsis (HCC) 09/03/2020   Acute cystitis without hematuria    Troponin I above reference range    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   Closed left hip fracture (HCC) 01/25/2017   S/P hardware removal 01/17/2017   Asthma, chronic 02/10/2015   Acute respiratory failure with hypoxia (HCC) 02/10/2015   Atelectasis 02/10/2015   Hypoxia    Shortness of breath    Femur fracture, left (HCC) 02/03/2015   Hypertension 02/03/2015   Essential hypertension 03/19/1989    Orientation RESPIRATION BLADDER Height & Weight     Self, Time, Situation, Place  Normal Incontinent, External catheter Weight: 205 lb 3.2 oz (93.1 kg) Height:  5\' 9"  (175.3 cm)  BEHAVIORAL SYMPTOMS/MOOD NEUROLOGICAL BOWEL NUTRITION STATUS      Continent Diet (see discharge summary)  AMBULATORY STATUS COMMUNICATION OF NEEDS Skin   Limited Assist Verbally Normal                       Personal Care Assistance Level of Assistance  Bathing, Feeding, Dressing, Total care Bathing Assistance: Limited assistance Feeding assistance: Independent Dressing Assistance: Limited assistance Total Care Assistance: Limited assistance   Functional Limitations Info  Sight, Hearing, Speech Sight Info: Adequate Hearing Info: Adequate Speech Info: Adequate    SPECIAL CARE FACTORS FREQUENCY  OT (By licensed OT), PT (By licensed PT)  PT Frequency: min 5x weekly OT Frequency: min 5x weekly            Contractures Contractures Info: Not present    Additional Factors Info  Code Status, Allergies Code Status Info: full Allergies Info: baclofen, simvastatin           Current Medications (06/05/2023):  This is the current hospital active medication list Current  Facility-Administered Medications  Medication Dose Route Frequency Provider Last Rate Last Admin   amiodarone (PACERONE) tablet 200 mg  200 mg Oral Daily Floydene Flock, MD   200 mg at 06/05/23 0948   apixaban (ELIQUIS) tablet 2.5 mg  2.5 mg Oral BID Floydene Flock, MD   2.5 mg at 06/05/23 4403   cyanocobalamin (VITAMIN B12) tablet 2,500 mcg  2,500 mcg Oral Daily Arnetha Courser, MD       diltiazem (CARDIZEM CD) 24 hr capsule 360 mg  360 mg Oral Daily Amin, Tilman Neat, MD       fluticasone furoate-vilanterol (BREO ELLIPTA) 100-25 MCG/ACT 1 puff  1 puff Inhalation Daily Arnetha Courser, MD       And   umeclidinium bromide (INCRUSE ELLIPTA) 62.5 MCG/ACT 1 puff  1 puff Inhalation Daily Arnetha Courser, MD       furosemide (LASIX) injection 40 mg  40 mg Intravenous Q12H Floydene Flock, MD   40 mg at 06/05/23 0620   insulin aspart (novoLOG) injection 0-9 Units  0-9 Units Subcutaneous TID WC Floydene Flock, MD   1 Units at 06/04/23 1655   metoprolol tartrate (LOPRESSOR) tablet 50 mg  50 mg Oral BID Arnetha Courser, MD       ondansetron (ZOFRAN) tablet 4 mg  4 mg Oral Q6H PRN Floydene Flock, MD       Or   ondansetron [facility]) injection 4 mg  4 mg Intravenous Q6H PRN Floydene Flock, MD       pantoprazole (PROTONIX) EC tablet 40 mg  40 mg Oral Daily Arnetha Courser, MD       traZODone (DESYREL) tablet 100 mg  100 mg Oral QHS Arnetha Courser, MD         Discharge Medications: Please see discharge summary for a list of discharge medications.  Relevant Imaging Results:  Relevant Lab Results:   Additional Information SSN: [id number]  Darolyn Rua, LCSW

## 2023-06-05 NOTE — Plan of Care (Signed)
  Problem: Fluid Volume: Goal: Ability to maintain a balanced intake and output will improve Outcome: Progressing   Problem: Metabolic: Goal: Ability to maintain appropriate glucose levels will improve Outcome: Progressing   Problem: Clinical Measurements: Goal: Ability to maintain clinical measurements within normal limits will improve Outcome: Progressing   Problem: Clinical Measurements: Goal: Respiratory complications will improve Outcome: Progressing   Problem: Clinical Measurements: Goal: Cardiovascular complication will be avoided Outcome: Progressing   Problem: Activity: Goal: Risk for activity intolerance will decrease Outcome: Progressing   Problem: Pain Management: Goal: General experience of comfort will improve Outcome: Progressing   Problem: Safety: Goal: Ability to remain free from injury will improve Outcome: Progressing

## 2023-06-05 NOTE — Progress Notes (Signed)
[facility] CLINIC CARDIOLOGY PROGRESS NOTE       Patient ID: Leslie Parker MRN: [id number] DOB/AGE: 01/29/1933 89 y.o.  Admit date: 06/04/2023 Referring Physician Dr. Doree Albee Primary Physician [name], MD  Primary Cardiologist Dr. Dorothyann Peng Reason for Consultation AoCHF  HPI: Leslie Parker is a 89 y.o. female  with a past medical history of chronic diastolic heart failure, sick sinus syndrome s/p [employer] DC PPM implant 11/15/2022, paroxysmal AF (eliquis), DM2, COPD who presented to the ED on 06/04/2023 for shortness of breath. Cardiology was consulted for further evaluation.   Interval History: -Patient reports feeling "much better".  -SOB improved overall and remains on 2L [place]. Did have some SOB while eating breakfast. -Cr slightly improved with diuresis today with good UOP.   Review of systems complete and found to be negative unless listed above    Past Medical History:  Diagnosis Date   Asthma    Atrial fibrillation with RVR (HCC)    Cancer (HCC)    Basal Cell   Diabetes mellitus without complication (HCC)    Heart murmur    Hypertension    Impetigo    Osteoarthritis    Osteopenia    Vomiting    can not due to surgery   Wears dentures    full upper and lower    Past Surgical History:  Procedure Laterality Date   ABDOMINAL HYSTERECTOMY     BACK SURGERY     BLADDER SURGERY     mesh   CARPAL TUNNEL RELEASE Bilateral    CATARACT EXTRACTION Right 2017   CATARACT EXTRACTION W/PHACO Left 02/06/2016   Procedure: CATARACT EXTRACTION PHACO AND INTRAOCULAR LENS PLACEMENT (IOC) left eye;  Surgeon: Sherald Hess, MD;  Location: [facility];  Service: Ophthalmology;  Laterality: Left;  DIABETIC LEFT Cannot arrive before 9:30   CERVICAL DISC SURGERY     CHOLECYSTECTOMY     EYE SURGERY Bilateral    Cataract Extraction with IOL   FEMUR IM NAIL Left 02/04/2015   Procedure: INTRAMEDULLARY (IM) NAIL FEMORAL;  Surgeon: Juanell Fairly, MD;  Location: [facility] OR[facility];  Service: Orthopedics;  Laterality: Left;   HARDWARE REMOVAL Left 01/17/2017   Procedure: HARDWARE REMOVAL;  Surgeon: Juanell Fairly, MD;  Location: [facility];  Service: Orthopedics;  Laterality: Left;   HERNIA REPAIR  2014   esophageal and gastric mesh. patient unable to throw up d/t mesh   HIP ARTHROPLASTY Left 01/26/2017   Procedure: ARTHROPLASTY BIPOLAR HIP (HEMIARTHROPLASTY) removal hardware left hip;  Surgeon: Deeann Saint, MD;  Location: [facility];  Service: Orthopedics;  Laterality: Left;   INTRAMEDULLARY (IM) NAIL INTERTROCHANTERIC Right 02/13/2023   Procedure: INTRAMEDULLARY (IM) NAIL INTERTROCHANTERIC;  Surgeon: Christena Flake, MD;  Location: [facility];  Service: Orthopedics;  Laterality: Right;   JOINT REPLACEMENT Bilateral    knees   PACEMAKER IMPLANT N/A 11/15/2022   Procedure: PACEMAKER IMPLANT;  Surgeon: Marcina Millard, MD;  Location: [facility];  Service: Cardiovascular;  Laterality: N/A;   PACEMAKER IMPLANT N/A 11/28/2022   Procedure: PACEMAKER IMPLANT;  Surgeon: Marcina Millard, MD;  Location: [facility];  Service: Cardiovascular;  Laterality: N/A;  Lead reposition   REPLACEMENT TOTAL KNEE BILATERAL Bilateral [id number]   SHOULDER ARTHROSCOPY WITH OPEN ROTATOR CUFF REPAIR AND DISTAL CLAVICLE ACROMINECTOMY Left 10/25/2016   Procedure: SHOULDER ARTHROSCOPY WITH OPEN ROTATOR CUFF REPAIR AND DISTAL CLAVICLE ACROMINECTOMY;  Surgeon: Juanell Fairly, MD;  Location: [facility];  Service: Orthopedics;  Laterality: Left;  THYROID SURGERY     goiter removed   TOTAL HIP REVISION Left 12/02/2017   Procedure: TOTAL HIP REVISION;  Surgeon: Lyndle Herrlich, MD;  Location: [facility] OR[facility];  Service: Orthopedics;  Laterality: Left;   TOTAL SHOULDER REPLACEMENT Right 2012    (Not in a hospital admission)  Social History   Socioeconomic History   Marital status: Divorced    Spouse name: Not on file   Number of children: Not on file    Years of education: Not on file   Highest education level: Not on file  Occupational History   Not on file  Tobacco Use   Smoking status: Never   Smokeless tobacco: Never  Vaping Use   Vaping status: Never Used  Substance and Sexual Activity   Alcohol use: No    Alcohol/week: 0.0 standard drinks of alcohol   Drug use: No   Sexual activity: Never  Other Topics Concern   Not on file  Social History Narrative   Not on file   Social Determinants of Health   Financial Resource Strain: Not on file  Food Insecurity: No Food Insecurity (06/05/2023)   Hunger Vital Sign    Worried About Running Out of Food in the Last Year: Never true    Ran Out of Food in the Last Year: Never true  Transportation Needs: No Transportation Needs (06/05/2023)   PRAPARE - Administrator, Civil Service (Medical): No    Lack of Transportation (Non-Medical): No  Physical Activity: Not on file  Stress: Not on file  Social Connections: Not on file  Intimate Partner Violence: Not At Risk (06/05/2023)   Humiliation, Afraid, Rape, and Kick questionnaire    Fear of Current or Ex-Partner: No    Emotionally Abused: No    Physically Abused: No    Sexually Abused: No    Family History  Problem Relation Age of Onset   Heart failure Mother    Hypertension Mother    Emphysema Father      Vitals:   06/05/23 0445 06/05/23 0500 06/05/23 0648 06/05/23 0757  BP:  133/69  110/64  Pulse: 76 62  82  Resp: 20 20  20   Temp:   98 F (36.7 C) 98.2 F (36.8 C)  TempSrc:   Oral Oral  SpO2: 95% 93%  98%  Weight:      Height:        PHYSICAL EXAM General: Ill-appearing elderly female, well nourished, in no acute distress, laying at slight incline in hospital bed. HEENT: Normocephalic and atraumatic. Neck: No JVD.  Lungs: Normal respiratory effort on 2L . Clear bilaterally to auscultation. + Rhonchi, improving. Heart: Irregularly irregular. Normal S1 and S2 without gallops or murmurs.  Abdomen:  Non-distended appearing.  Msk: Normal strength and tone for age. Extremities: Warm and well perfused. No clubbing, cyanosis. 1+ pitting edema bilaterally.  Neuro: Alert and oriented X 3. Psych: Answers questions appropriately.   Labs: Basic Metabolic Panel: Recent Labs    06/04/23 1308 06/05/23 0621  NA 133* 137  K 4.3 3.6  CL 101 103  CO2 23 24  GLUCOSE 163* 98  BUN 32* 30*  CREATININE 1.88* 1.78*  CALCIUM 8.8* 7.7*   Liver Function Tests: Recent Labs    06/05/23 0621  AST 21  ALT 19  ALKPHOS 82  BILITOT 0.3  PROT 5.1*  ALBUMIN 2.4*   No results for input(s): "LIPASE", "AMYLASE" in the last 72 hours. CBC: Recent Labs    06/04/23  1308 06/05/23 0621  WBC 5.5 5.5  NEUTROABS 4.5  --   HGB 13.0 10.7*  HCT 40.5 33.2*  MCV 90.0 89.7  PLT 313 280   Cardiac Enzymes: Recent Labs    06/04/23 1308 06/04/23 1457  TROPONINIHS 17 16   BNP: Recent Labs    06/04/23 1308  BNP 1,527.8*   D-Dimer: No results for input(s): "DDIMER" in the last 72 hours. Hemoglobin A1C: No results for input(s): "HGBA1C" in the last 72 hours. Fasting Lipid Panel: No results for input(s): "CHOL", "HDL", "LDLCALC", "TRIG", "CHOLHDL", "LDLDIRECT" in the last 72 hours. Thyroid Function Tests: No results for input(s): "TSH", "T4TOTAL", "T3FREE", "THYROIDAB" in the last 72 hours.  Invalid input(s): "FREET3" Anemia Panel: No results for input(s): "VITAMINB12", "FOLATE", "FERRITIN", "TIBC", "IRON", "RETICCTPCT" in the last 72 hours.   Radiology: DG Chest Portable 1 View  Result Date: 06/04/2023 CLINICAL DATA:  Shortness of breath EXAM: PORTABLE CHEST 1 VIEW COMPARISON:  Chest x-ray 04/30/2023 FINDINGS: Left-sided pacemaker is again seen. The heart is enlarged, unchanged. There is new elevation of the right hemidiaphragm versus moderate right pleural effusion. There is a small left pleural effusion. There are patchy opacities in both lower lungs, left greater than right. There is no  pneumothorax or acute fracture. Right shoulder arthroplasty is again seen. IMPRESSION: 1. New elevation of the right hemidiaphragm versus moderate right pleural effusion. 2. Small left pleural effusion. 3. Patchy opacities in both lower lungs, left greater than right, which may represent atelectasis or infection. Electronically Signed   By: Darliss Cheney M.D.   On: 06/04/2023 16:44   US RENAL  Result Date: 05/06/2023 CLINICAL DATA:  Acute kidney injury. EXAM: RENAL / URINARY TRACT ULTRASOUND COMPLETE COMPARISON:  Dec 04, 2020. FINDINGS: Right Kidney: Renal measurements: 11.9 x 6.6 x 5.7 cm = volume: 233 mL. Multiple cysts are noted, the largest measuring 9.5 cm. Echogenicity within normal limits. No mass or hydronephrosis visualized. Left Kidney: Renal measurements: 11.3 x 5.2 x 5.2 cm = volume: 158 mL. Multiple cysts are noted, the largest measuring 4.8 cm. Echogenicity within normal limits. No mass or hydronephrosis visualized. Bladder: Appears normal for degree of bladder distention. Other: None. IMPRESSION: Multiple bilateral renal cysts. No hydronephrosis or renal obstruction is noted. Electronically Signed   By: Lupita Raider M.D.   On: 05/06/2023 20:26    ECHO 01/2023: 1. Left ventricular ejection fraction, by estimation, is 60 to 65%. The left ventricle has normal function. The left ventricle has no regional wall motion abnormalities. There is moderate concentric left ventricular hypertrophy. Left ventricular diastolic parameters are consistent with Grade I diastolic dysfunction (impaired relaxation).   2. Right ventricular systolic function is normal. The right ventricular size is normal.   3. The mitral valve is grossly normal. Mild mitral valve regurgitation.   4. The aortic valve is calcified. Aortic valve regurgitation is trivial. Aortic valve sclerosis/calcification is present, without any evidence of aortic stenosis.   TELEMETRY reviewed by me 06/05/2023: atrial fibrillation, rate  80s  EKG reviewed by me: Sinus rhythm rate 66 bpm  Data reviewed by me 06/05/2023: last 24h vitals tele labs imaging I/O hospitialist note.  Principal Problem:   Acute on chronic respiratory failure with hypoxia (HCC) Active Problems:   Acute respiratory failure with hypoxia (HCC)   Chronic a-fib (HCC)   COPD (chronic obstructive pulmonary disease) (HCC)   Acute on chronic diastolic CHF (congestive heart failure) (HCC)   Sick sinus syndrome (HCC)   CKD stage 3 due  to type 2 diabetes mellitus (HCC)   Type II diabetes mellitus with renal manifestations (HCC)    ASSESSMENT AND PLAN:  Leslie Parker is a 89 y.o. female  with a past medical history of chronic diastolic heart failure, sick sinus syndrome s/p [employer] DC PPM implant 11/15/2022, paroxysmal AF (eliquis), DM2, COPD who presented to the ED on 06/04/2023 for shortness of breath. Cardiology was consulted for further evaluation.   # Acute on chronic HFpEF # Acute hypoxic respiratory failure # Hypertension Patient presenting with progressively worsening shortness of breath, orthopnea, lower extremity edema.  Initially required BiPAP now weaned to 2 L [place].  BNP 1500, CXR with probable R pleural effusion.  S/p IV Lasix in the ED. -Continue IV Lasix 40 mg twice daily.  -Consider addition of SGLT2i if renal function will tolerate.  # SSS s/p DC PPM 10/2022 # Paroxysmal atrial fibrillation Device appears to be functioning well. Patient without complaints of palpitations, heart racing, dizziness.  -Continue Eliquis 2.5 mg twice daily for stroke risk reduction. -Continue home amiodarone 200 mg daily for rhythm control. -Consider restarting Home rate control meds tomorrow.  # Chronic kidney disease stage IIIb # Hypokalemia Creatinine 1.88 on admission. Improved to 1.78 this morning. -Continue to monitor renal function closely with diuresis. -Monitor and replenish electrolytes for a goal K >4, Mag >2    This patient's plan of  care was discussed and created with Dr. Juliann Pares and he is in agreement.  Signed: Gale Journey, PA-C  06/05/2023, 10:53 AM [facility] Cardiology

## 2023-06-05 NOTE — TOC Initial Note (Signed)
Transition of Care [facility]) - Initial/Assessment Note    Patient Details  Name: Leslie Parker MRN: [id number] Date of Birth: 03-Mar-1933  Transition of Care [facility]) CM/SW Contact:    Darolyn Rua, LCSW Phone Number: 06/05/2023, 4:07 PM  Clinical Narrative:                  CSW notes PT recs are for SNF.   CSW spoke with patient regarding SNF recommendations, she reports "no way" that she is not going to SNF and that she will resume HH services at discharge through [facility] and wants PT OT and RN   CSW has updated [place] with [facility] of above, no dme needs at this time.    Expected Discharge Plan: Home w Home Health Services Barriers to Discharge: Continued Medical Work up   Patient Goals and CMS Choice Patient states their goals for this hospitalization and ongoing recovery are:: to go home   Choice offered to / list presented to : Patient      Expected Discharge Plan and Services       Living arrangements for the past 2 months: Single Family Home                                      Prior Living Arrangements/Services Living arrangements for the past 2 months: Single Family Home Lives with:: Self                   Activities of Daily Living   ADL Screening (condition at time of admission) Independently performs ADLs?: No Does the patient have a NEW difficulty with bathing/dressing/toileting/self-feeding that is expected to last >3 days?: No Does the patient have a NEW difficulty with getting in/out of bed, walking, or climbing stairs that is expected to last >3 days?: No Does the patient have a NEW difficulty with communication that is expected to last >3 days?: No Is the patient deaf or have difficulty hearing?: No Does the patient have difficulty seeing, even when wearing glasses/contacts?: No Does the patient have difficulty concentrating, remembering, or making decisions?: No  Permission Sought/Granted                  Emotional  Assessment       Orientation: : Oriented to Self, Oriented to Place, Oriented to  Time, Oriented to Situation Alcohol / Substance Use: Not Applicable    Admission diagnosis:  SOB (shortness of breath) [R06.02] Acute on chronic respiratory failure with hypoxia (HCC) [J96.21] Congestive heart failure, unspecified HF chronicity, unspecified heart failure type (HCC) [I50.9] Patient Active Problem List   Diagnosis Date Noted   Acute on chronic respiratory failure with hypoxia (HCC) 06/04/2023   Hyponatremia 05/05/2023   Acute delirium 05/04/2023   Myocardial injury 05/01/2023   Overweight (BMI 25.0-29.9) 05/01/2023   Left hip pain 05/01/2023   Hypokalemia 04/30/2023   Anemia 03/21/2023   Abnormal LFTs 03/20/2023   Closed right hip fracture (HCC) 02/13/2023   Fall at home, initial encounter 02/13/2023   Type II diabetes mellitus with renal manifestations (HCC) 02/13/2023   Chronic diastolic CHF (congestive heart failure) (HCC) 02/13/2023   Leukocytosis 02/13/2023   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   Acute kidney injury superimposed on CKD (HCC) 11/24/2022   CKD stage 3 due to type 2 diabetes mellitus (HCC) 11/24/2022   UTI (urinary tract infection) 11/24/2022   Bradycardia 11/24/2022   Paroxysmal  atrial fibrillation (HCC) 11/24/2022   Sick sinus syndrome (HCC) 11/15/2022   Acute on chronic diastolic CHF (congestive heart failure) (HCC) 08/27/2022   COPD (chronic obstructive pulmonary disease) (HCC) 08/23/2022   Chronic a-fib (HCC) 08/22/2022   Respiratory distress 08/22/2022   Atrial fibrillation with rapid ventricular response (HCC) 06/06/2022   Rapid atrial fibrillation, new onset(HCC) 06/05/2022   Diabetes mellitus without complication (HCC)    Stage 3b chronic kidney disease (HCC)    Lactic acidosis    Postural dizziness with presyncope    Community acquired pneumonia    Hypomagnesemia    Sepsis (HCC) 09/03/2020   Acute cystitis without hematuria    Troponin I above  reference range    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 [place] [address] Phone: [phone] Fax: [phone]  [id number] - [place], [place] - [address] [place] [address] Phone: [phone] Fax: [phone]  [id number], [place] - [address] [place] [address] Phone: [phone] Fax: [phone]     Social Determinants of Health (SDOH) Social History: SDOH Screenings   Food Insecurity: No Food Insecurity (06/05/2023)  Housing: Low Risk  (06/05/2023)  Transportation Needs: No Transportation Needs (06/05/2023)  Utilities: Not At Risk (06/05/2023)  Tobacco Use: Low Risk  (06/05/2023)   SDOH Interventions:     Readmission Risk Interventions    03/21/2023    1:12 PM 08/27/2022   12:05 PM  Readmission Risk Prevention Plan  Transportation Screening Complete Complete  PCP or Specialist Appt within 3-5 Days Complete Complete  HRI or Home Care Consult Complete Complete  Social Work Consult for Recovery Care Planning/Counseling Complete   Palliative Care Screening Not Applicable   Medication Review Oceanographer) Complete Complete

## 2023-06-05 NOTE — Hospital Course (Addendum)
Taken from H&P.  Leslie Parker is a 89 y.o. 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  Patient reports increased work of breathing over the past 2 to 3 days.  Positive orthopnea and PND.   On presentation she was hemodynamically stable, no recorded hypoxia but she was placed on BiPAP.  Labs mostly unremarkable, COVID, influenza and RSV negative, creatinine 1.9 and BNP of 1530.  Admitted for acute on chronic HFpEF, started on IV diuresis and cardiology was consulted.  11/13: Vital stable, currently on 3 L of oxygen, some improvement of creatinine to 1.78, procalcitonin negative.  No baseline oxygen use, decreased to 1 L.  11/14: Hemodynamically stable, now on room air.  PT was recommending SNF but patient wants to go home with home health services.  Slight increase in creatinine so decreasing the dose of Lasix to daily and cardiology is planning to start p.o. from tomorrow.  11/15: Patient remained hemodynamically stable, slight worsening of creatinine but remained mostly within baseline.  Patient should be encouraged to keep up with p.o. hydration.  Cardiology make her every other day Lasix daily at 20 mg.  They cleared her for discharge and she was also requesting to go home.  However PT recommendations for rehab but she refused.  Maximum home health services were ordered.  Patient will continue on current medications and need to have a close follow-up with her providers for further recommendations.  She was given an appointment for cardiology and heart failure clinic.  Patient will be high risk for readmission and mortality based on her advanced age and underlying comorbidities.

## 2023-06-05 NOTE — Evaluation (Signed)
Physical Therapy Evaluation Patient Details Name: Leslie Parker MRN: [id number] DOB: April 27, 1933 Today's Date: 06/05/2023  History of Present Illness  Pt is a 89 y.o. 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 Patient reports increased work of breathing over the past 2 to 3 days. Positive orthopnea and PND. Minimal cough or wheezing. Using home inhaler with minimal improvement in symptoms.  Clinical Impression  Pt is received in bed, she is agreeable to PT session but refusing OOB mobility. At baseline, Pt reports use of RW for household amb although has become more difficult recently and working with HHPT on standing and LE strengthening. Pt performs bed mobility mod I; unable to further assess mobility at this time secondary to Pt reporting "moving makes me feel out of breath". Assessed Pt's SpO2% on RA with reading of 97%. Encouraged Pt to wear O2 support with mobility but Pt refused stating "I don't need oxygen". Pt further states "tomorrow I will try to sit at edge of bed but not today". Pt able to perform bed exercises with no difficulty. Pt would benefit from skilled PT to address above deficits and promote optimal return to PLOF.      If plan is discharge home, recommend the following: A lot of help with walking and/or transfers;A little help with bathing/dressing/bathroom;Assist for transportation;Help with stairs or ramp for entrance   Can travel by private vehicle   No    Equipment Recommendations None recommended by PT  Recommendations for Other Services       Functional Status Assessment Patient has had a recent decline in their functional status and demonstrates the ability to make significant improvements in function in a reasonable and  predictable amount of time.     Precautions / Restrictions Precautions Precautions: Fall Restrictions Weight Bearing Restrictions: No      Mobility  Bed Mobility Overal bed mobility: Needs Assistance Bed Mobility: Rolling Rolling: Modified independent (Device/Increase time)         General bed mobility comments: Able to perform rolling to R/L side mod I with no cuing required for hand placement; unable to assess further mobility at this time secondary to reports of "feeling out of breath"    Transfers                   General transfer comment: Unable to further assess further due to reports of "I can't breathe when I move"    Ambulation/Gait               General Gait Details: Unable to further assess further due to reports of "I can't breathe when I move"  Stairs            Wheelchair Mobility     Tilt Bed    Modified Rankin (Stroke Patients Only)       Balance Overall balance assessment: Needs assistance     Sitting balance - Comments: Unable to assess seated balance at this time due to reports of "I can't breathe when I move"       Standing balance comment: Unable to assess standing balance at this time due to reports of "I can't breathe when I move"  Pertinent Vitals/Pain Pain Assessment Pain Assessment: No/denies pain    Home Living Family/patient expects to be discharged to:: Private residence Software engineer) Living Arrangements: Alone Available Help at Discharge: Family Type of Home: Apartment Home Access: Level entry       Home Layout: One level Home Equipment: Rollator (4 wheels);Cane - single point;Grab bars - tub/shower;Grab bars - toilet;Lift chair;Transport chair Additional Comments: Senior living apartment    Prior Function Prior Level of Function : Needs assist;History of Falls (last six months)             Mobility Comments: Pt reports amb with RW in  household amb distance; Pt reports last fall was in July ADLs Comments: mod I for ADL/IADLs     Extremity/Trunk Assessment   Upper Extremity Assessment Upper Extremity Assessment: Generalized weakness;Defer to OT evaluation    Lower Extremity Assessment Lower Extremity Assessment: Generalized weakness       Communication   Communication Communication: No apparent difficulties Cueing Techniques: Verbal cues  Cognition Arousal: Alert Behavior During Therapy: WFL for tasks assessed/performed Overall Cognitive Status: Within Functional Limits for tasks assessed                                 General Comments: AO x4; Pleasant and refuses OOB mobility reporting "I feel out of breathe when I move"        General Comments      Exercises Other Exercises Other Exercises: Edu Pt on PT role and benefits of OOB mobility to improve and promote PLOF Other Exercises: x10 ankle pumps, x5 heel slides   Assessment/Plan    PT Assessment Patient needs continued PT services  PT Problem List Decreased strength;Decreased mobility;Decreased range of motion;Decreased activity tolerance;Decreased coordination;Decreased balance       PT Treatment Interventions DME instruction;Therapeutic exercise;Gait training;Functional mobility training;Therapeutic activities    PT Goals (Current goals can be found in the Care Plan section)  Acute Rehab PT Goals Patient Stated Goal: to go home PT Goal Formulation: With patient Time For Goal Achievement: 06/19/23 Potential to Achieve Goals: Fair    Frequency Min 1X/week     Co-evaluation               AM-PAC PT "6 Clicks" Mobility  Outcome Measure Help needed turning from your back to your side while in a flat bed without using bedrails?: A Little Help needed moving from lying on your back to sitting on the side of a flat bed without using bedrails?: A Little Help needed moving to and from a bed to a chair (including a  wheelchair)?: A Little Help needed standing up from a chair using your arms (e.g., wheelchair or bedside chair)?: A Little Help needed to walk in hospital room?: A Lot Help needed climbing 3-5 steps with a railing? : A Lot 6 Click Score: 16    End of Session   Activity Tolerance: Patient limited by fatigue;Other (comment) (Additional reports of feeling out of breathe) Patient left: in bed;with call bell/phone within reach;with bed alarm set Nurse Communication: Mobility status PT Visit Diagnosis: Muscle weakness (generalized) (M62.81);History of falling (Z91.81);Difficulty in walking, not elsewhere classified (R26.2)    Time: 1610-9604 PT Time Calculation (min) (ACUTE ONLY): 13 min   Charges:                 Elmon Else, SPT   Marshall Roehrich 06/05/2023, 3:57 PM

## 2023-06-05 NOTE — Progress Notes (Signed)
Progress Note   Patient: Leslie Parker:[id number] DOB: July 17, 1933 DOA: 06/04/2023     1 DOS: the patient was seen and examined on 06/05/2023   Brief hospital course: Taken from H&P.  Leslie Parker is a 89 y.o. 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  Patient reports increased work of breathing over the past 2 to 3 days.  Positive orthopnea and PND.   On presentation she was hemodynamically stable, no recorded hypoxia but she was placed on BiPAP.  Labs mostly unremarkable, COVID, influenza and RSV negative, creatinine 1.9 and BNP of 1530.  Admitted for acute on chronic HFpEF, started on IV diuresis and cardiology was consulted.  11/13: Vital stable, currently on 3 L of oxygen, some improvement of creatinine to 1.78, procalcitonin negative.  No baseline oxygen use, decreased to 1 L.    Assessment and Plan: Acute respiratory failure with hypoxia (HCC) Decompensated respiratory failure initially requiring BiPAP in the setting of COPD, HFpEF, Overall clinical presentation most consistent with volume overload Positive Rales on exam with 3+ pitting edema bilaterally BNP 1530 with baseline BNP around 300-400 8+ kilo gram weight gain from May 04, 2023 discharge. Now weaned back to 1 L, no baseline oxygen use -Continue supplemental oxygen-wean as tolerated  Acute on chronic diastolic CHF (congestive heart failure) (HCC) 2D echo July 2024 with EF of 60 to 65% and grade 1 diastolic dysfunction Volume overload on admission with 3+ pitting edema and bilateral rails BNP 1530 with baseline around 3-400 8+ kilogram weight gain from May 04, 2023 discharge -Continue with IV Lasix -Daily weight and BMP -Strict intake and output -Cardiology is on  board  COPD (chronic obstructive pulmonary disease) (HCC) Decompensated respiratory failure on BiPAP with predominant component of volume overload No significant wheezing on exam present Does not appear to be an active exacerbation Will continue as needed DuoNebs for now Reassess and escalate treatment as clinically appropriate Follow  Chronic a-fib (HCC) Stable Continue Eliquis , Cardizem and metoprolol Cardiology added amiodarone  Type II diabetes mellitus with renal manifestations (HCC) Blood sugar in 160s, patient was on glipizide at home SSI Monitor  CKD stage 3 due to type 2 diabetes mellitus (HCC) Creatinine 1.9 with GFR in the 20s looks to be new baseline in review of recent renal function labs. Slight improvement today. -Monitor renal function closely while she is being diuresed -Avoid nephrotoxins  Sick sinus syndrome (HCC) Status post pacemaker      Subjective: Patient continued to have mild shortness of breath but stating it is much improved than admission.  No baseline oxygen use.  No chest pain.  Physical Exam: Vitals:   06/05/23 1000 06/05/23 1030 06/05/23 1100 06/05/23 1342  BP: 116/65 (!) 138/123 132/76 (!) 151/99  Pulse: 87 88 90 85  Resp: 17 (!) 23 (!) 21 (!) 22  Temp:    98 F (36.7 C)  TempSrc:      SpO2: 98% 96% 96% 95%  Weight:      Height:       General.  Frail elderly lady, in no acute distress. Pulmonary.  Lungs clear bilaterally, normal respiratory effort. CV.  Regular rate and rhythm, no JVD, rub or murmur. Abdomen.  Soft, nontender, nondistended, BS positive. CNS.  Alert and oriented .  No focal neurologic  deficit. Extremities.  1+ LE edema, no cyanosis, pulses intact and symmetrical. Psychiatry.  Judgment and insight appears normal.   Data Reviewed: Prior data reviewed  Family Communication: Talked with grand daughter on phone.  Disposition: Status is: Inpatient Remains inpatient appropriate because: Severity of illness   Planned Discharge Destination: Home  DVT prophylaxis.  Eliquis Time spent: 50 minutes  This record has been created using Conservation officer, historic buildings. Errors have been sought and corrected,but may not always be located. Such creation errors do not reflect on the standard of care.   Author: Arnetha Courser, MD 06/05/2023 2:24 PM  For on call review [email or web address].

## 2023-06-06 ENCOUNTER — Encounter: Payer: Self-pay | Admitting: Family Medicine

## 2023-06-06 ENCOUNTER — Ambulatory Visit: Payer: Self-pay | Admitting: *Deleted

## 2023-06-06 DIAGNOSIS — I482 Chronic atrial fibrillation, unspecified: Secondary | ICD-10-CM | POA: Diagnosis not present

## 2023-06-06 DIAGNOSIS — E1122 Type 2 diabetes mellitus with diabetic chronic kidney disease: Secondary | ICD-10-CM | POA: Diagnosis not present

## 2023-06-06 DIAGNOSIS — J9621 Acute and chronic respiratory failure with hypoxia: Secondary | ICD-10-CM | POA: Diagnosis not present

## 2023-06-06 DIAGNOSIS — I5033 Acute on chronic diastolic (congestive) heart failure: Secondary | ICD-10-CM | POA: Diagnosis not present

## 2023-06-06 LAB — BASIC METABOLIC PANEL
Anion gap: 12 (ref 5–15)
BUN: 34 mg/dL — ABNORMAL HIGH (ref 8–23)
CO2: 24 mmol/L (ref 22–32)
Calcium: 8.2 mg/dL — ABNORMAL LOW (ref 8.9–10.3)
Chloride: 101 mmol/L (ref 98–111)
Creatinine, Ser: 1.88 mg/dL — ABNORMAL HIGH (ref 0.44–1.00)
GFR, Estimated: 25 mL/min — ABNORMAL LOW (ref >60)
Glucose, Bld: 110 mg/dL — ABNORMAL HIGH (ref 70–99)
Potassium: 3.3 mmol/L — ABNORMAL LOW (ref 3.5–5.1)
Sodium: 137 mmol/L (ref 135–145)

## 2023-06-06 LAB — GLUCOSE, CAPILLARY
Glucose-Capillary: 127 mg/dL — ABNORMAL HIGH (ref 70–99)
Glucose-Capillary: 116 mg/dL — ABNORMAL HIGH (ref 70–99)
Glucose-Capillary: 155 mg/dL — ABNORMAL HIGH (ref 70–99)
Glucose-Capillary: 147 mg/dL — ABNORMAL HIGH (ref 70–99)

## 2023-06-06 MED ORDER — FUROSEMIDE 10 MG/ML IJ SOLN
40.0000 mg | Freq: Every day | INTRAMUSCULAR | Status: DC
Start: 2023-06-07 — End: 2023-06-06

## 2023-06-06 MED ORDER — FUROSEMIDE 20 MG PO TABS
20.0000 mg | ORAL_TABLET | Freq: Every day | ORAL | Status: DC
  Administered 2023-06-07: 20 mg via ORAL
  Filled 2023-06-06: qty 1

## 2023-06-06 MED ORDER — FUROSEMIDE 20 MG PO TABS: 20.0000 mg | ORAL_TABLET | Freq: Every day | ORAL | Status: DC

## 2023-06-06 NOTE — Assessment & Plan Note (Addendum)
2D echo July 2024 with EF of 60 to 65% and grade 1 diastolic dysfunction Volume overload on admission with 3+ pitting edema and bilateral rails BNP 1530 with baseline around 300-400 8+ kilogram weight gain from May 04, 2023 discharge -Cardiology switched to p.o. Lasix at 20 mg daily instead of every other day which she was taking before. -Daily weight and BMP -Strict intake and output -Cardiology is on board

## 2023-06-06 NOTE — Plan of Care (Signed)
  Problem: Education: Goal: Ability to demonstrate management of disease process will improve Outcome: Progressing   Problem: Activity: Goal: Capacity to carry out activities will improve Outcome: Progressing   Problem: Cardiac: Goal: Ability to achieve and maintain adequate cardiopulmonary perfusion will improve Outcome: Progressing   Problem: Education: Goal: Ability to describe self-care measures that may prevent or decrease complications (Diabetes Survival Skills Education) will improve Outcome: Progressing

## 2023-06-06 NOTE — TOC Transition Note (Signed)
Transition of Care [facility]) - CM/SW Discharge Note   Patient Details  Name: Leslie Parker MRN: [id number] Date of Birth: 1933-02-27  [facility] of [facility]) CM/SW Contact:  Darolyn Rua, LCSW Phone Number: 06/06/2023, 11:59 AM   Clinical Narrative:     Patient to discharge home today with [facility] PT OT RN and aide, [place] with [facility] informed of discharge today. No additional needs.    Final next level of care: Home w Home Health Services Barriers to Discharge: No Barriers Identified   Patient Goals and CMS Choice CMS Medicare.gov Compare Post Acute Care list provided to:: Patient Choice offered to / list presented to : Patient  Discharge Placement                         Discharge Plan and Services Additional resources added to the After Visit Summary for                            [facility] Arranged: PT, OT, RN, Nurse's Aide [facility] Agency: [facility] Date [facility] Agency Contacted: 06/06/23 Time HH Agency Contacted: [phone] Representative spoke with at [facility] Agency: [place]  Social Determinants of Health (SDOH) Interventions SDOH Screenings   Food Insecurity: No Food Insecurity (06/05/2023)  Housing: Low Risk  (06/06/2023)  Transportation Needs: No Transportation Needs (06/06/2023)  Utilities: Not At Risk (06/05/2023)  Financial Resource Strain: Low Risk  (06/06/2023)  Tobacco Use: Low Risk  (06/05/2023)     Readmission Risk Interventions    03/21/2023    1:12 PM 08/27/2022   12:05 PM  Readmission Risk Prevention Plan  Transportation Screening Complete Complete  PCP or Specialist Appt within 3-5 Days Complete Complete  HRI or Home Care Consult Complete Complete  Social Work Consult for Recovery Care Planning/Counseling Complete   Palliative Care Screening Not Applicable   Medication Review Oceanographer) Complete Complete

## 2023-06-06 NOTE — Progress Notes (Signed)
Progress Note   Patient: Leslie Parker DOB: 1932/08/28 DOA: 06/04/2023     2 DOS: the patient was seen and examined on 06/06/2023   Brief hospital course: Taken from H&P.  Leslie Parker is a 89 y.o. 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  Patient reports increased work of breathing over the past 2 to 3 days.  Positive orthopnea and PND.   On presentation she was hemodynamically stable, no recorded hypoxia but she was placed on BiPAP.  Labs mostly unremarkable, COVID, influenza and RSV negative, creatinine 1.9 and BNP of 1530.  Admitted for acute on chronic HFpEF, started on IV diuresis and cardiology was consulted.  11/13: Vital stable, currently on 3 L of oxygen, some improvement of creatinine to 1.78, procalcitonin negative.  No baseline oxygen use, decreased to 1 L.  11/14: Hemodynamically stable, now on room air.  PT was recommending SNF but patient wants to go home with home health services.  Slight increase in creatinine so decreasing the dose of Lasix to daily and cardiology is planning to start p.o. from tomorrow.   Assessment and Plan: Acute respiratory failure with hypoxia (HCC) Decompensated respiratory failure initially requiring BiPAP in the setting of COPD, HFpEF, Overall clinical presentation most consistent with volume overload Positive Rales on exam with 3+ pitting edema bilaterally BNP 1530 with baseline BNP around 300-400 8+ kilo gram weight gain from May 04, 2023 discharge. Now weaned back to 1 L, no baseline oxygen use -Continue supplemental oxygen-wean as tolerated  Acute on chronic diastolic CHF (congestive heart failure) (HCC) 2D echo July 2024 with EF of 60 to 65% and grade 1 diastolic  dysfunction Volume overload on admission with 3+ pitting edema and bilateral rails BNP 1530 with baseline around 300-400 8+ kilogram weight gain from May 04, 2023 discharge -Continue with IV Lasix-dose decreased to daily due to slight increase in creatinine -Daily weight and BMP -Strict intake and output -Cardiology is on board  COPD (chronic obstructive pulmonary disease) (HCC) Decompensated respiratory failure on BiPAP with predominant component of volume overload No significant wheezing on exam present Does not appear to be an active exacerbation Will continue as needed DuoNebs for now Reassess and escalate treatment as clinically appropriate Follow  Chronic a-fib (HCC) Stable Continue Eliquis , Cardizem and metoprolol Cardiology added amiodarone  Type II diabetes mellitus with renal manifestations (HCC) Blood sugar in 160s, patient was on glipizide at home SSI Monitor  CKD stage 3 due to type 2 diabetes mellitus (HCC) Creatinine 1.9 with GFR in the 20s looks to be new baseline in review of recent renal function labs. Slight improvement today. -Monitor renal function closely while she is being diuresed -Avoid nephrotoxins  Sick sinus syndrome (HCC) Status post pacemaker      Subjective: Patient was feeling much improved and on room air when seen today.  She wants to go home but agreed to spend another night.  Physical Exam: Vitals:   06/05/23 2321 06/06/23 0334 06/06/23 0842 06/06/23 1131  BP: 139/80 (!) 176/73 (!) 173/87 (!) 173/77  Pulse: 61 61 62 60  Resp: 20 18    Temp: 97.8 F (36.6 C) 97.6 F (36.4 C) 98.1 F (36.7 C) 97.9 F (36.6 C)  TempSrc:      SpO2: 91% (!) 89% 91%  98%  Weight:      Height:       General.  Frail elderly lady, in no acute distress. Pulmonary.  Lungs clear bilaterally, normal respiratory effort. CV.  Regular rate and rhythm, no JVD, rub or murmur. Abdomen.  Soft, nontender, nondistended, BS positive. CNS.  Alert and  oriented .  No focal neurologic deficit. Extremities.  No edema, no cyanosis, pulses intact and symmetrical. Psychiatry.  Judgment and insight appears normal.   Data Reviewed: Prior data reviewed  Family Communication:   Disposition: Status is: Inpatient Remains inpatient appropriate because: Severity of illness  Planned Discharge Destination: Home  DVT prophylaxis.  Eliquis Time spent: 45 minutes  This record has been created using Conservation officer, historic buildings. Errors have been sought and corrected,but may not always be located. Such creation errors do not reflect on the standard of care.   Author: Arnetha Courser, MD 06/06/2023 3:36 PM  For on call review [email or web address].

## 2023-06-06 NOTE — Evaluation (Signed)
Occupational Therapy Evaluation Patient Details Name: Leslie Parker MRN: [id number] DOB: 21-Sep-1932 Today's Date: 06/06/2023   History of Present Illness Pt is a 89 y.o. 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 Patient reports increased work of breathing over the past 2 to 3 days. Positive orthopnea and PND. Minimal cough or wheezing. Using home inhaler with minimal improvement in symptoms.   Clinical Impression   Pt was seen for OT evaluation this date. Prior to hospital admission, pt was Mod I for ADLs. Pt lives alone. Pt presents to acute OT demonstrating impaired ADL performance and functional mobility 2/2  (See OT problem list for additional functional deficits). Upon arrival to room pt supine in bed, agreeable to tx with maximal encouragement. Pt completed bed mobility with MOD I and use of bed rails. Pt completed sts with CGA+RW and tolerated standing for ~5-7 minutes. Pt took 4 steps forward and 4 steps backward with CGA +RW. Pt offered BSC and/or chair, pt declined any further mobility. Pt reported that she is having a hard time doing any type of activity and per conversation with physician need to stay in bed and rest. Pt reported that she is very upset about PT SNF recommendation. OT discussed with patient about concerns and purpose for therapy. Team notified. Asked RN to remove pure wick to increase functional mobility. Pt left supine in bed with call bell within reach and all needs met. Pt at the end of the session stated "I promise to work tomorrow and walk more." Pt would benefit from skilled OT services to address noted impairments and functional limitations (see below for any additional details) in order to maximize safety and independence while  minimizing falls risk and caregiver burden. Anticipate the need for follow up OT services upon acute hospital DC.        If plan is discharge home, recommend the following: Assistance with cooking/housework;Assist for transportation    Functional Status Assessment  Patient has had a recent decline in their functional status and demonstrates the ability to make significant improvements in function in a reasonable and predictable amount of time.  Equipment Recommendations  None recommended by OT    Recommendations for Other Services       Precautions / Restrictions Precautions Precautions: Fall Restrictions Weight Bearing Restrictions: No      Mobility Bed Mobility Overal bed mobility: Needs Assistance Bed Mobility: Rolling Rolling: Modified independent (Device/Increase time)              Transfers Overall transfer level: Needs assistance Equipment used: Rolling walker (2 wheels) Transfers: Sit to/from Stand Sit to Stand: Contact guard assist                  Balance Overall balance assessment: Needs assistance Sitting-balance support: Bilateral upper extremity supported, Feet supported Sitting balance-[name]: Good     Standing balance support: Bilateral upper extremity supported, Reliant on assistive device for balance, During functional activity Standing balance-[name]: Fair                             ADL either performed or assessed with clinical judgement   ADL Overall ADL's : Needs assistance/impaired  Functional mobility during ADLs: Contact guard assist;Rolling walker (2 wheels) General ADL Comments: Pt completed sts with CGA+RW and took 4 steps forward and backward. Pt offered BSC and/or chair, pt declined any further mobility.     Vision Patient Visual Report: No change from baseline       Perception         Praxis         Pertinent Vitals/Pain Pain  Assessment Pain Assessment: Faces Faces Pain Scale: Hurts a little bit Pain Location: R leg Pain Descriptors / Indicators: Discomfort, Grimacing Pain Intervention(s): Limited activity within patient's tolerance, Monitored during session     Extremity/Trunk Assessment Upper Extremity Assessment Upper Extremity Assessment: Generalized weakness   Lower Extremity Assessment Lower Extremity Assessment: Generalized weakness       Communication Communication Communication: No apparent difficulties Cueing Techniques: Verbal cues   Cognition Arousal: Alert Behavior During Therapy: Agitated Overall Cognitive Status: Within Functional Limits for tasks assessed                                       General Comments       Exercises     Shoulder Instructions      Home Living Family/patient expects to be discharged to:: Private residence Living Arrangements: Alone Available Help at Discharge: Family Type of Home: Apartment Home Access: Level entry     Home Layout: One level     Bathroom Shower/Tub: Tub/shower unit;Sponge bathes at baseline   Bathroom Toilet: Handicapped height     Home Equipment: Rollator (4 wheels);Cane - single point;Grab bars - tub/shower;Grab bars - toilet;Lift chair;Transport chair;Tub bench   Additional Comments: Senior living apartment; Pt reports having HH services and a RN that comes by 3x a week      Prior Functioning/Environment Prior Level of Function : Needs assist;History of Falls (last six months)             Mobility Comments: Pt reports amb with RW in household amb distance; Pt reports last fall was in July ADLs Comments: mod I for ADL/IADLs. Pt reports that she bathes at the sink when alone and when her daughter comes by, she helps her get in the tub and take a shower seated        OT Problem List: Decreased strength;Decreased range of motion;Decreased activity tolerance;Decreased coordination;Impaired balance  (sitting and/or standing);Decreased safety awareness      OT Treatment/Interventions: Self-care/ADL training;Therapeutic exercise;Energy conservation;Therapeutic activities;DME and/or AE instruction    OT Goals(Current goals can be found in the care plan section) Acute Rehab OT Goals Patient Stated Goal: to go home OT Goal Formulation: With patient Time For Goal Achievement: 06/20/23 Potential to Achieve Goals: Good  OT Frequency: Min 1X/week    Co-evaluation              AM-PAC OT "6 Clicks" Daily Activity     Outcome Measure Help from another person eating meals?: None Help from another person taking care of personal grooming?: A Little Help from another person toileting, which includes using toliet, bedpan, or urinal?: A Little Help from another person bathing (including washing, rinsing, drying)?: A Lot Help from another person to put on and taking off regular upper body clothing?: A Little Help from another person to put on and taking off regular lower body clothing?: A Lot 6 Click Score: 17   End of Session Equipment Utilized During Treatment: Rolling  walker (2 wheels) Nurse Communication: Mobility status  Activity Tolerance: Patient limited by fatigue (Pt stated need for bedrest and feeling out of breath when moving around.) Patient left: in bed;with call bell/phone within reach;with bed alarm set  OT Visit Diagnosis: Unsteadiness on feet (R26.81);Other abnormalities of gait and mobility (R26.89);Muscle weakness (generalized) (M62.81)                Time: 4098-1191 OT Time Calculation (min): 29 min Charges:     Butch Penny, SOT

## 2023-06-06 NOTE — Progress Notes (Signed)
[facility] CARDIOLOG[facility] PROGRESS NOTE       Patient ID: Leslie Parker MRN: [id number] DOB/AGE: [birth year] 89 y.o.  Admit date: 06/04/2023 Referring Physician Dr. Doree Albee Primary Physician [name], MD  Primary Cardiologist Dr. Dorothyann Peng Reason for Consultation AoCHF  HPI: Leslie Parker is a 89 y.o. female  with a past medical history of chronic diastolic heart failure, sick sinus syndrome s/p [employer] DC PPM implant 11/15/2022, paroxysmal AF (eliquis), DM2, COPD who presented to the ED on 06/04/2023 for shortness of breath. Cardiology was consulted for further evaluation.   Interval History: -Patient states that she feels well, continues to improve.  -SOB improving, weaned to room air. Feels almost back to baseline.  -Continues to report good UOP. BP elevated, HR stable.  Review of systems complete and found to be negative unless listed above    Past Medical History:  Diagnosis Date   Asthma    Atrial fibrillation with RVR (HCC)    Cancer (HCC)    Basal Cell   Diabetes mellitus without complication (HCC)    Heart murmur    Hypertension    Impetigo    Osteoarthritis    Osteopenia    Vomiting    can not due to surgery   Wears dentures    full upper and lower    Past Surgical History:  Procedure Laterality Date   ABDOMINAL HYSTERECTOMY     BACK SURGERY     BLADDER SURGERY     mesh   CARPAL TUNNEL RELEASE Bilateral    CATARACT EXTRACTION Right 2017   CATARACT EXTRACTION W/PHACO Left 02/06/2016   Procedure: CATARACT EXTRACTION PHACO AND INTRAOCULAR LENS PLACEMENT (IOC) left eye;  Surgeon: Sherald Hess, MD;  Location: [facility];  Service: Ophthalmology;  Laterality: Left;  DIABETIC LEFT Cannot arrive before 9:30   CERVICAL DISC SURGERY     CHOLECYSTECTOMY     EYE SURGERY Bilateral    Cataract Extraction with IOL   FEMUR IM NAIL Left 02/04/2015   Procedure: INTRAMEDULLARY (IM) NAIL FEMORAL;  Surgeon: Juanell Fairly, MD;  Location: [facility];  Service: Orthopedics;  Laterality: Left;   HARDWARE REMOVAL Left 01/17/2017   Procedure: HARDWARE REMOVAL;  Surgeon: Juanell Fairly, MD;  Location: [facility];  Service: Orthopedics;  Laterality: Left;   HERNIA REPAIR  2014   esophageal and gastric mesh. patient unable to throw up d/t mesh   HIP ARTHROPLASTY Left 01/26/2017   Procedure: ARTHROPLASTY BIPOLAR HIP (HEMIARTHROPLASTY) removal hardware left hip;  Surgeon: Deeann Saint, MD;  Location: [facility];  Service: Orthopedics;  Laterality: Left;   INTRAMEDULLARY (IM) NAIL INTERTROCHANTERIC Right 02/13/2023   Procedure: INTRAMEDULLARY (IM) NAIL INTERTROCHANTERIC;  Surgeon: Christena Flake, MD;  Location: [facility];  Service: Orthopedics;  Laterality: Right;   JOINT REPLACEMENT Bilateral    knees   PACEMAKER IMPLANT N/A 11/15/2022   Procedure: PACEMAKER IMPLANT;  Surgeon: Marcina Millard, MD;  Location: [facility];  Service: Cardiovascular;  Laterality: N/A;   PACEMAKER IMPLANT N/A 11/28/2022   Procedure: PACEMAKER IMPLANT;  Surgeon: Marcina Millard, MD;  Location: [facility];  Service: Cardiovascular;  Laterality: N/A;  Lead reposition   REPLACEMENT TOTAL KNEE BILATERAL Bilateral [id number]   SHOULDER ARTHROSCOPY WITH OPEN ROTATOR CUFF REPAIR AND DISTAL CLAVICLE ACROMINECTOMY Left 10/25/2016   Procedure: SHOULDER ARTHROSCOPY WITH OPEN ROTATOR CUFF REPAIR AND DISTAL CLAVICLE ACROMINECTOMY;  Surgeon: Juanell Fairly, MD;  Location: [facility];  Service: Orthopedics;  Laterality: Left;  THYROID SURGERY     goiter removed   TOTAL HIP REVISION Left 12/02/2017   Procedure: TOTAL HIP REVISION;  Surgeon: Lyndle Herrlich, MD;  Location: [facility];  Service: Orthopedics;  Laterality: Left;   TOTAL SHOULDER REPLACEMENT Right 2012    Medications Prior to Admission  Medication Sig Dispense Refill Last Dose   albuterol (VENTOLIN HFA) 108 (90 Base) MCG/ACT inhaler Inhale 2 puffs into the lungs every 6  (six) hours as needed.   06/03/2023   amiodarone (PACERONE) 200 MG tablet Take 200 mg by mouth daily.   06/03/2023   apixaban (ELIQUIS) 2.5 MG TABS tablet Take 1 tablet (2.5 mg total) by mouth 2 (two) times daily. 60 tablet 1 06/03/2023 at am   ascorbic acid (VITAMIN C) 250 MG CHEW Chew 250 mg by mouth daily.   06/03/2023   Cholecalciferol (VITAMIN D-1000 MAX ST) 25 MCG (1000 UT) tablet Take 1,000 Units by mouth daily.   06/03/2023   colestipol (COLESTID) 1 g tablet Take 1 tablet by mouth 2 (two) times daily.   06/03/2023   Cyanocobalamin (B-12) 2500 MCG TABS Take 2,500 mcg by mouth daily.   06/03/2023   diltiazem (CARDIZEM CD) 360 MG 24 hr capsule Take 360 mg by mouth daily.   06/03/2023   furosemide (LASIX) 20 MG tablet Take 1 tablet (20 mg total) by mouth every other day. 30 tablet 0 06/03/2023   glipiZIDE (GLUCOTROL XL) 2.5 MG 24 hr tablet Take 2.5 mg by mouth daily with breakfast.   06/03/2023   hydrALAZINE (APRESOLINE) 25 MG tablet Take 25 mg by mouth 3 (three) times daily as needed. Take 1 tablet (25 mg total) by mouth 3 (three) times daily as needed (for SBP >150 conistently or DBP>100 persistently)   06/03/2023   ipratropium-albuterol (DUONEB) 0.5-2.5 (3) MG/3ML SOLN Inhale 3 mLs into the lungs every 6 (six) hours as needed.   06/03/2023   Magnesium Oxide 500 MG CAPS Take 500 mg by mouth daily.   06/03/2023   metoprolol tartrate (LOPRESSOR) 50 MG tablet Take 1 tablet by mouth 2 (two) times daily.   06/03/2023   Multiple Vitamin (MULTIVITAMIN WITH MINERALS) TABS tablet Take 1 tablet by mouth daily. One-A-Day Women's Vitamin   06/03/2023   omeprazole (PRILOSEC) 20 MG capsule Take 20 mg by mouth every morning.   06/03/2023   ondansetron (ZOFRAN-ODT) 4 MG disintegrating tablet Take 4 mg by mouth every 8 (eight) hours as needed.   Past Week   traZODone (DESYREL) 100 MG tablet Take 100 mg by mouth at bedtime.   06/03/2023   TRELEGY ELLIPTA 100-62.5-25 MCG/ACT AEPB Inhale 1 puff into the lungs  daily.   06/03/2023   Social History   Socioeconomic History   Marital status: Divorced    Spouse name: Not on file   Number of children: 4   Years of education: Not on file   Highest education level: 12th grade  Occupational History   Occupation: Retitred/Disability  Tobacco Use   Smoking status: Never   Smokeless tobacco: Never  Vaping Use   Vaping status: Never Used  Substance and Sexual Activity   Alcohol use: No    Alcohol/week: 0.0 standard drinks of alcohol   Drug use: No   Sexual activity: Never  Other Topics Concern   Not on file  Social History Narrative   Not on file   Social Determinants of Health   Financial Resource Strain: Low Risk  (06/06/2023)   Overall Financial Resource Strain (CARDIA)  Difficulty of Paying Living Expenses: Not hard at all  Food Insecurity: No Food Insecurity (06/05/2023)   Hunger Vital Sign    Worried About Running Out of Food in the Last Year: Never true    Ran Out of Food in the Last Year: Never true  Transportation Needs: No Transportation Needs (06/06/2023)   PRAPARE - Administrator, Civil Service (Medical): No    Lack of Transportation (Non-Medical): No  Physical Activity: Not on file  Stress: Not on file  Social Connections: Not on file  Intimate Partner Violence: Not At Risk (06/05/2023)   Humiliation, Afraid, Rape, and Kick questionnaire    Fear of Current or Ex-Partner: No    Emotionally Abused: No    Physically Abused: No    Sexually Abused: No    Family History  Problem Relation Age of Onset   Heart failure Mother    Hypertension Mother    Emphysema Father      Vitals:   06/05/23 2321 06/06/23 0334 06/06/23 0842 06/06/23 1131  BP: 139/80 (!) 176/73 (!) 173/87 (!) 173/77  Pulse: 61 61 62 60  Resp: 20 18    Temp: 97.8 F (36.6 C) 97.6 F (36.4 C) 98.1 F (36.7 C) 97.9 F (36.6 C)  TempSrc:      SpO2: 91% (!) 89% 91% 98%  Weight:      Height:        PHYSICAL EXAM General: Ill-appearing  elderly female, well nourished, in no acute distress, laying at slight incline in hospital bed. HEENT: Normocephalic and atraumatic. Neck: No JVD.  Lungs: Normal respiratory effort on room air. Clear bilaterally to auscultation. + Rhonchi, improving. Heart: Irregularly irregular. Normal S1 and S2 without gallops or murmurs.  Abdomen: Non-distended appearing.  Msk: Normal strength and tone for age. Extremities: Warm and well perfused. No clubbing, cyanosis. Trace pitting edema bilaterally.  Neuro: Alert and oriented X 3. Psych: Answers questions appropriately.   Labs: Basic Metabolic Panel: Recent Labs    06/05/23 0621 06/06/23 0613  NA 137 137  K 3.6 3.3*  CL 103 101  CO2 24 24  GLUCOSE 98 110*  BUN 30* 34*  CREATININE 1.78* 1.88*  CALCIUM 7.7* 8.2*   Liver Function Tests: Recent Labs    06/05/23 0621  AST 21  ALT 19  ALKPHOS 82  BILITOT 0.3  PROT 5.1*  ALBUMIN 2.4*   No results for input(s): "LIPASE", "AMYLASE" in the last 72 hours. CBC: Recent Labs    06/04/23 1308 06/05/23 0621  WBC 5.5 5.5  NEUTROABS 4.5  --   HGB 13.0 10.7*  HCT 40.5 33.2*  MCV 90.0 89.7  PLT 313 280   Cardiac Enzymes: Recent Labs    06/04/23 1308 06/04/23 1457  TROPONINIHS 17 16   BNP: Recent Labs    06/04/23 1308  BNP 1,527.8*   D-Dimer: No results for input(s): "DDIMER" in the last 72 hours. Hemoglobin A1C: No results for input(s): "HGBA1C" in the last 72 hours. Fasting Lipid Panel: No results for input(s): "CHOL", "HDL", "LDLCALC", "TRIG", "CHOLHDL", "LDLDIRECT" in the last 72 hours. Thyroid Function Tests: No results for input(s): "TSH", "T4TOTAL", "T3FREE", "THYROIDAB" in the last 72 hours.  Invalid input(s): "FREET3" Anemia Panel: No results for input(s): "VITAMINB12", "FOLATE", "FERRITIN", "TIBC", "IRON", "RETICCTPCT" in the last 72 hours.   Radiology: DG Chest Portable 1 View  Result Date: 06/04/2023 CLINICAL DATA:  Shortness of breath EXAM: PORTABLE CHEST 1  VIEW COMPARISON:  Chest x-ray 04/30/2023 FINDINGS:  Left-sided pacemaker is again seen. The heart is enlarged, unchanged. There is new elevation of the right hemidiaphragm versus moderate right pleural effusion. There is a small left pleural effusion. There are patchy opacities in both lower lungs, left greater than right. There is no pneumothorax or acute fracture. Right shoulder arthroplasty is again seen. IMPRESSION: 1. New elevation of the right hemidiaphragm versus moderate right pleural effusion. 2. Small left pleural effusion. 3. Patchy opacities in both lower lungs, left greater than right, which may represent atelectasis or infection. Electronically Signed   By: Darliss Cheney M.D.   On: 06/04/2023 16:44    ECHO 01/2023: 1. Left ventricular ejection fraction, by estimation, is 60 to 65%. The left ventricle has normal function. The left ventricle has no regional wall motion abnormalities. There is moderate concentric left ventricular hypertrophy. Left ventricular diastolic parameters are consistent with Grade I diastolic dysfunction (impaired relaxation).   2. Right ventricular systolic function is normal. The right ventricular size is normal.   3. The mitral valve is grossly normal. Mild mitral valve regurgitation.   4. The aortic valve is calcified. Aortic valve regurgitation is trivial. Aortic valve sclerosis/calcification is present, without any evidence of aortic stenosis.   TELEMETRY reviewed by me 06/06/2023: AV pacing rate 60s  EKG reviewed by me: Sinus rhythm rate 66 bpm  Data reviewed by me 06/06/2023: last 24h vitals tele labs imaging I/O hospitialist note.  Principal Problem:   Acute on chronic respiratory failure with hypoxia (HCC) Active Problems:   Acute respiratory failure with hypoxia (HCC)   Chronic a-fib (HCC)   COPD (chronic obstructive pulmonary disease) (HCC)   Acute on chronic diastolic CHF (congestive heart failure) (HCC)   Sick sinus syndrome (HCC)   CKD stage 3  due to type 2 diabetes mellitus (HCC)   Type II diabetes mellitus with renal manifestations (HCC)    ASSESSMENT AND PLAN:  Leslie Parker is a 89 y.o. female  with a past medical history of chronic diastolic heart failure, sick sinus syndrome s/p [employer] DC PPM implant 11/15/2022, paroxysmal AF (eliquis), DM2, COPD who presented to the ED on 06/04/2023 for shortness of breath. Cardiology was consulted for further evaluation.   # Acute on chronic HFpEF # Acute hypoxic respiratory failure # Hypertension Patient presenting with progressively worsening shortness of breath, orthopnea, lower extremity edema.  Initially required BiPAP now weaned to 2 L [place].  BNP 1500, CXR with probable R pleural effusion.  S/p IV Lasix in the ED. -Transition to po diuretics tomorrow.  -Consider addition of SGLT2i if renal function will tolerate.  # SSS s/p DC PPM 10/2022 # Paroxysmal atrial fibrillation Device appears to be functioning well. Patient without complaints of palpitations, heart racing, dizziness.  -Continue Eliquis 2.5 mg twice daily for stroke risk reduction. -Continue home amiodarone 200 mg daily for rhythm control. -Continue diltiazem 360 mg daily and metoprolol 50 mg twice daily.  # Chronic kidney disease stage IIIb # Hypokalemia Creatinine 1.88 on admission. Stable today -Continue to monitor renal function closely with diuresis. -Monitor and replenish electrolytes for a goal K >4, Mag >2    This patient's plan of care was discussed and created with Dr. Corky Sing and he is in agreement.  Signed: Gale Journey, PA-C  06/06/2023, 2:37 PM [facility]

## 2023-06-06 NOTE — Patient Outreach (Signed)
  Care Coordination   Follow Up Visit Note   06/06/2023 Name: Leslie Parker MRN: [id number] DOB: 1932/09/01  Leslie Parker is a 89 y.o. year old female who sees [name], MD for primary care.   What matters to the patients health and wellness today?  Patient chart reviewed for outreach today, noted she was admitted to hospital on 11/12.  Hospital liaison notified of admission.  Per inpatient TOC note, patient has declined SNF and will likely go home with home health.    Goals Addressed             This Visit's Progress    Management of COPD       Interventions Today    Flowsheet Row Most Recent Value  Chronic Disease   Chronic disease during today's visit Congestive Heart Failure (CHF), Chronic Obstructive Pulmonary Disease (COPD)  General Interventions   General Interventions Discussed/Reviewed General Interventions Reviewed, Communication with  Communication with RN  [hospital liaison made aware of admission]              SDOH assessments and interventions completed:  No     Care Coordination Interventions:  Yes, provided   Follow up plan:  Pending hospital discharge.    Encounter Outcome:  Patient Visit Completed   Rodney Langton, RN, MSN, CCM [place]  [facility], [facility] RN Care Coordinator Direct Dial: [phone] / Main [phone] Fax [phone] Email: [email or web address]

## 2023-06-06 NOTE — Progress Notes (Signed)
Education Assessment and Provision:  Detailed education and instructions provided on heart failure disease management including the following:  Signs and symptoms of Heart Failure When to call the physician Importance of daily weights Low sodium diet Fluid restriction Medication management  Anticipated future follow-up appointments-Pt had an appointment with [name] that was moved up on the scheduled within her 1 week discharge from [facility]  With patients permission I contacted her granddaughter [name]) in regards to appointment date and time change on 06/13/23 @ 10:30 am since she is her transportation.  Patient education given on each of the above topics.  Patient acknowledges understanding via teach back method and acceptance of all instructions.  Education Materials:  "Living Better With Heart Failure" Booklet, HF zone tool, & Daily Weight Tracker Tool.  Patient has scale at home: Yes Patient has pill box at home: Yes    [name], RN, BSN [facility] Heart Failure Navigator Secure Chat Only

## 2023-06-06 NOTE — Discharge Instructions (Signed)

## 2023-06-07 DIAGNOSIS — D631 Anemia in chronic kidney disease: Secondary | ICD-10-CM | POA: Diagnosis not present

## 2023-06-07 DIAGNOSIS — I509 Heart failure, unspecified: Secondary | ICD-10-CM | POA: Diagnosis not present

## 2023-06-07 DIAGNOSIS — J441 Chronic obstructive pulmonary disease with (acute) exacerbation: Secondary | ICD-10-CM | POA: Diagnosis not present

## 2023-06-07 DIAGNOSIS — J9601 Acute respiratory failure with hypoxia: Secondary | ICD-10-CM | POA: Diagnosis not present

## 2023-06-07 DIAGNOSIS — N179 Acute kidney failure, unspecified: Secondary | ICD-10-CM | POA: Diagnosis not present

## 2023-06-07 DIAGNOSIS — N184 Chronic kidney disease, stage 4 (severe): Secondary | ICD-10-CM | POA: Diagnosis not present

## 2023-06-07 DIAGNOSIS — I110 Hypertensive heart disease with heart failure: Secondary | ICD-10-CM | POA: Diagnosis not present

## 2023-06-07 DIAGNOSIS — J9621 Acute and chronic respiratory failure with hypoxia: Secondary | ICD-10-CM | POA: Diagnosis not present

## 2023-06-07 DIAGNOSIS — I5033 Acute on chronic diastolic (congestive) heart failure: Secondary | ICD-10-CM | POA: Diagnosis not present

## 2023-06-07 DIAGNOSIS — J449 Chronic obstructive pulmonary disease, unspecified: Secondary | ICD-10-CM | POA: Diagnosis not present

## 2023-06-07 DIAGNOSIS — E1122 Type 2 diabetes mellitus with diabetic chronic kidney disease: Secondary | ICD-10-CM | POA: Diagnosis not present

## 2023-06-07 LAB — BASIC METABOLIC PANEL
Anion gap: 10 (ref 5–15)
BUN: 39 mg/dL — ABNORMAL HIGH (ref 8–23)
CO2: 27 mmol/L (ref 22–32)
Calcium: 7.8 mg/dL — ABNORMAL LOW (ref 8.9–10.3)
Chloride: 101 mmol/L (ref 98–111)
Creatinine, Ser: 2.21 mg/dL — ABNORMAL HIGH (ref 0.44–1.00)
GFR, Estimated: 21 mL/min — ABNORMAL LOW (ref >60)
Glucose, Bld: 124 mg/dL — ABNORMAL HIGH (ref 70–99)
Potassium: 3.2 mmol/L — ABNORMAL LOW (ref 3.5–5.1)
Sodium: 138 mmol/L (ref 135–145)

## 2023-06-07 LAB — GLUCOSE, CAPILLARY
Glucose-Capillary: 122 mg/dL — ABNORMAL HIGH (ref 70–99)
Glucose-Capillary: 93 mg/dL (ref 70–99)

## 2023-06-07 MED ORDER — FUROSEMIDE 20 MG PO TABS: 20.0000 mg | ORAL_TABLET | Freq: Every day | ORAL | 1 refills | Status: DC

## 2023-06-07 MED ORDER — HYDRALAZINE HCL 25 MG PO TABS
25.0000 mg | ORAL_TABLET | Freq: Three times a day (TID) | ORAL | Status: DC
Start: 2023-06-07 — End: 2023-06-07

## 2023-06-07 MED ORDER — POTASSIUM CHLORIDE CRYS ER 20 MEQ PO TBCR
40.0000 meq | EXTENDED_RELEASE_TABLET | Freq: Once | ORAL | Status: AC
  Administered 2023-06-07: 40 meq via ORAL
  Filled 2023-06-07: qty 2

## 2023-06-07 NOTE — Assessment & Plan Note (Signed)
Decompensated respiratory failure initially requiring BiPAP in the setting of COPD, HFpEF, Overall clinical presentation most consistent with volume overload Positive Rales on exam with 3+ pitting edema bilaterally BNP 1530 with baseline BNP around 300-400 8+ kilo gram weight gain from May 04, 2023 discharge. Now weaned back to room air.

## 2023-06-07 NOTE — Consult Note (Signed)
[facility] Liaison Consult Note    06/07/2023  Leslie Parker 05-08-1933 [id number]  Primary Care Provider:  [name]  Patient is currently active with Care Management for chronic disease management services.  Patient has been engaged by a RNCM.  Our community based plan of care has focused on disease management and community resource support.   Patient will receive a post hospital call and will be evaluated for assessments and disease process education.   Plan:  Pt discharged with [facility] 06/06/23 ([facility])  Of note, Care Management services does not replace or interfere with any services that are needed or arranged by inpatient [facility] care management team.   For additional questions or referrals please contact:  Elliot Cousin, RN, [facility] Liaison [place]   [facility], Population Health Office Hours MTWF  8:00 am-6:00 pm Direct Dial: [phone] mobile [phone] [Office toll free line] Office Hours are M-F 8:30 - 5 pm [email or web address]

## 2023-06-07 NOTE — TOC Transition Note (Signed)
Transition of Care [facility]) - CM/SW Discharge Note   Patient Details  Name: Leslie Parker MRN: [id number] Date of Birth: [birth year]  Transition of Care [facility]) CM/SW Contact:  Darolyn Rua, LCSW Phone Number: 06/07/2023, 1:02 PM   Clinical Narrative:     Patient to discharge home today with [facility], [place] with [facility] informed, family to pick up for discharge, no further discharge needs identified.   Final next level of care: Home w Home Health Services Barriers to Discharge: No Barriers Identified   Patient Goals and CMS Choice CMS Medicare.gov Compare Post Acute Care list provided to:: Patient Choice offered to / list presented to : Patient  Discharge Placement                         Discharge Plan and Services Additional resources added to the After Visit Summary for                            [facility] Arranged: PT, OT, RN, Nurse's Aide [facility] Agency: [facility] Date [facility] Agency Contacted: 06/06/23 Time HH Agency Contacted: 1159 Representative spoke with at [facility] Agency: [place]  Social Determinants of Health (SDOH) Interventions SDOH Screenings   Food Insecurity: No Food Insecurity (06/05/2023)  Housing: Low Risk  (06/06/2023)  Transportation Needs: No Transportation Needs (06/06/2023)  Utilities: Not At Risk (06/05/2023)  Financial Resource Strain: Low Risk  (06/06/2023)  Tobacco Use: Low Risk  (06/05/2023)     Readmission Risk Interventions    03/21/2023    1:12 PM 08/27/2022   12:05 PM  Readmission Risk Prevention Plan  Transportation Screening Complete Complete  PCP or Specialist Appt within 3-5 Days Complete Complete  HRI or Home Care Consult Complete Complete  Social Work Consult for Recovery Care Planning/Counseling Complete   Palliative Care Screening Not Applicable   Medication Review [facility]) Complete Complete

## 2023-06-07 NOTE — Discharge Summary (Signed)
Physician Discharge Summary   Patient: Leslie Parker MRN: [id number] DOB: [birth year]  Admit date:     06/04/2023  Discharge date:  06/07/2023  Discharge Physician: Arnetha Courser   PCP: [name], MD   Recommendations at discharge: Please see [place] Please obtain CBC and BMP on follow-up Follow-up with cardiology Follow-up with primary care provider  Discharge Diagnoses: Principal Problem:   Acute on chronic respiratory failure with hypoxia [facility]) Active Problems:   Acute respiratory failure with hypoxia (HCC)   Acute on chronic diastolic CHF (congestive heart failure) (HCC)   SOB (shortness of breath)   COPD (chronic obstructive pulmonary disease) (HCC)   Chronic a-fib (HCC)   Type II diabetes mellitus with renal manifestations (HCC)   Sick sinus syndrome (HCC)   CKD stage 3 due to type 2 diabetes mellitus (HCC)   Congestive heart failure [facility])   Hospital Course: Taken from H&P.  BEANCA Parker is a 89 y.o. 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  Patient reports increased work of breathing over the past 2 to 3 days.  Positive orthopnea and PND.   On presentation she was hemodynamically stable, no recorded hypoxia but she was placed on BiPAP.  Labs mostly unremarkable, COVID, influenza and RSV negative, creatinine 1.9 and BNP of 1530.  Admitted for acute on chronic HFpEF, started on IV diuresis and cardiology was consulted.  11/13: Vital stable, currently on 3 L of oxygen, some improvement of creatinine to 1.78, procalcitonin negative.  No baseline oxygen use, decreased to 1 L.  11/14: Hemodynamically stable, now on room air.  PT was recommending SNF but patient wants to go home with home health services.  Slight increase in  creatinine so decreasing the dose of Lasix to daily and cardiology is planning to start p.o. from tomorrow.  11/15: Patient remained hemodynamically stable, slight worsening of creatinine but remained mostly within baseline.  Patient should be encouraged to keep up with p.o. hydration.  Cardiology make her every other day Lasix daily at 20 mg.  They cleared her for discharge and she was also requesting to go home.  However PT recommendations for rehab but she refused.  Maximum home health services were ordered.  Patient will continue on current medications and need to have a close follow-up with her providers for further recommendations.  She was given an appointment for cardiology and heart failure clinic.  Patient will be high risk for readmission and mortality based on her advanced age and underlying comorbidities.  Assessment and Plan: Acute respiratory failure with hypoxia (HCC) Decompensated respiratory failure initially requiring BiPAP in the setting of COPD, HFpEF, Overall clinical presentation most consistent with volume overload Positive Rales on exam with 3+ pitting edema bilaterally BNP 1530 with baseline BNP around 300-400 8+ kilo gram weight gain from May 04, 2023 discharge. Now weaned back to room air.  Acute on chronic diastolic CHF (congestive heart failure) (HCC) 2D echo July 2024 with EF of 60 to 65% and grade 1 diastolic dysfunction Volume overload on admission with 3+ pitting edema and bilateral rails BNP 1530 with baseline around 300-400 8+ kilogram weight gain from May 04, 2023 discharge -Cardiology switched to p.o. Lasix at 20 mg daily instead of every other day which she was taking before. -Daily weight and BMP -Strict intake  and output -Cardiology is on board  COPD (chronic obstructive pulmonary disease) (HCC) Decompensated respiratory failure on BiPAP with predominant component of volume overload No significant wheezing on exam present Does not appear  to be an active exacerbation Will continue as needed DuoNebs for now Reassess and escalate treatment as clinically appropriate Follow  Chronic a-fib (HCC) Stable Continue Eliquis , Cardizem and metoprolol Cardiology added amiodarone  Type II diabetes mellitus with renal manifestations (HCC) Blood sugar in 160s, patient was on glipizide at home SSI Monitor  CKD stage 3 due to type 2 diabetes mellitus (HCC) Creatinine 1.9 with GFR in the 20s looks to be new baseline in review of recent renal function labs. Slight improvement today. -Monitor renal function closely while she is being diuresed -Avoid nephrotoxins  Sick sinus syndrome (HCC) Status post pacemaker         Consultants: Cardiology Procedures performed: None Disposition: Home health Diet recommendation:  Discharge Diet Orders (From admission, onward)     Start     Ordered   06/07/23 0000  Diet - low sodium heart healthy        06/07/23 1130           Cardiac diet DISCHARGE MEDICATION: Allergies as of 06/07/2023       Reactions   Baclofen Other (See Comments)   Elevated Cr   Simvastatin Other (See Comments)   Pt denies Elevated LFTs with simva 40mg , stopped and resolved (see MD note 11/10/13)        Medication List     TAKE these medications    albuterol 108 (90 Base) MCG/ACT inhaler Commonly known as: VENTOLIN HFA Inhale 2 puffs into the lungs every 6 (six) hours as needed.   amiodarone 200 MG tablet Commonly known as: PACERONE Take 200 mg by mouth daily.   apixaban 2.5 MG Tabs tablet Commonly known as: ELIQUIS Take 1 tablet (2.5 mg total) by mouth 2 (two) times daily.   ascorbic acid 250 MG Chew Commonly known as: VITAMIN C Chew 250 mg by mouth daily.   B-12 2500 MCG Tabs Take 2,500 mcg by mouth daily.   colestipol 1 g tablet Commonly known as: COLESTID Take 1 tablet by mouth 2 (two) times daily.   diltiazem 360 MG 24 hr capsule Commonly known as: CARDIZEM CD Take 360 mg  by mouth daily.   furosemide 20 MG tablet Commonly known as: LASIX Take 1 tablet (20 mg total) by mouth daily. What changed: when to take this   glipiZIDE 2.5 MG 24 hr tablet Commonly known as: GLUCOTROL XL Take 2.5 mg by mouth daily with breakfast.   hydrALAZINE 25 MG tablet Commonly known as: APRESOLINE Take 25 mg by mouth 3 (three) times daily as needed. Take 1 tablet (25 mg total) by mouth 3 (three) times daily as needed (for SBP >150 conistently or DBP>100 persistently)   ipratropium-albuterol 0.5-2.5 (3) MG/3ML Soln Commonly known as: DUONEB Inhale 3 mLs into the lungs every 6 (six) hours as needed.   Magnesium Oxide -Mg Supplement 500 MG Caps Take 500 mg by mouth daily.   metoprolol tartrate 50 MG tablet Commonly known as: LOPRESSOR Take 1 tablet by mouth 2 (two) times daily.   multivitamin with minerals Tabs tablet Take 1 tablet by mouth daily. One-A-Day Women's Vitamin   omeprazole 20 MG capsule Commonly known as: PRILOSEC Take 20 mg by mouth every morning.   ondansetron 4 MG disintegrating tablet Commonly known as: ZOFRAN-ODT Take 4 mg by mouth every 8 (  eight) hours as needed.   traZODone 100 MG tablet Commonly known as: DESYREL Take 100 mg by mouth at bedtime.   Trelegy Ellipta 100-62.5-25 MCG/ACT Aepb Generic drug: Fluticasone-Umeclidin-Vilant Inhale 1 puff into the lungs daily.   Vitamin D-1000 Max St 25 MCG (1000 UT) tablet Generic drug: Cholecalciferol Take 1,000 Units by mouth daily.        Follow-up Information     Alwyn Pea, MD. Go in 1 week(s).   Specialties: Cardiology, Internal Medicine Contact information: [address] [place] [address] [phone]         [facility]. Go in 7 day(s).   Specialty: Cardiology Why: Hospital follow-up 06/13/23 @ 10:30 am Please bring all medications to appt. [facility], [address], [address] Floor Contact information: [address] [place] [address] [phone]        [name], MD. Schedule an appointment as soon as possible for a visit in 1 week(s).   Specialty: Internal Medicine Contact information: [address] [place] [address] [phone]                Discharge Exam: Ceasar Mons Weights   06/04/23 1259  Weight: 93.1 kg   General.  Frail elderly lady, in no acute distress. Pulmonary.  Lungs clear bilaterally, normal respiratory effort. CV.  Regular rate and rhythm, no JVD, rub or murmur. Abdomen.  Soft, nontender, nondistended, BS positive. CNS.  Alert and oriented .  No focal neurologic deficit. Extremities.  No edema, no cyanosis, pulses intact and symmetrical. Psychiatry.  Judgment and insight appears normal.   Condition at discharge: stable  The results of significant diagnostics from this hospitalization (including imaging, microbiology, ancillary and laboratory) are listed below for reference.   Imaging Studies: DG Chest Portable 1 View  Result Date: 06/04/2023 CLINICAL DATA:  Shortness of breath EXAM: PORTABLE CHEST 1 VIEW COMPARISON:  Chest x-ray 04/30/2023 FINDINGS: Left-sided pacemaker is again seen. The heart is enlarged, unchanged. There is new elevation of the right hemidiaphragm versus moderate right pleural effusion. There is a small left pleural effusion. There are patchy opacities in both lower lungs, left greater than right. There is no pneumothorax or acute fracture. Right shoulder arthroplasty is again seen. IMPRESSION: 1. New elevation of the right hemidiaphragm versus moderate right pleural effusion. 2. Small left pleural effusion. 3. Patchy opacities in both lower lungs, left greater than right, which may represent atelectasis or infection. Electronically Signed   By: Darliss Cheney M.D.   On: 06/04/2023 16:44    Microbiology: Results for orders placed or performed during the hospital encounter of 06/04/23  Resp panel  by RT-PCR (RSV, Flu A&B, Covid) Anterior Nasal Swab     Status: None   Collection Time: 06/04/23  1:08 PM   Specimen: Anterior Nasal Swab  Result Value Ref Range Status   SARS Coronavirus 2 by RT PCR NEGATIVE NEGATIVE Final    Comment: (NOTE) SARS-CoV-2 target nucleic acids are NOT DETECTED.  The SARS-CoV-2 RNA is generally detectable in upper respiratory specimens during the acute phase of infection. The lowest concentration of SARS-CoV-2 viral copies this assay can detect is 138 copies/mL. A negative result does not preclude SARS-Cov-2 infection and should not be used as the sole basis for treatment or other patient management decisions. A negative result may occur with  improper specimen collection/handling, submission of specimen other than nasopharyngeal swab, presence of viral mutation(s) within the areas targeted by  this assay, and inadequate number of viral copies(<138 copies/mL). A negative result must be combined with clinical observations, patient history, and epidemiological information. The expected result is Negative.  Fact Sheet for Patients:  BloggerCourse.com  Fact Sheet for Healthcare Providers:  SeriousBroker.it  This test is no t yet approved or cleared by the [place] FDA and  has been authorized for detection and/or diagnosis of SARS-CoV-2 by FDA under an Emergency Use Authorization (EUA). This EUA will remain  in effect (meaning this test can be used) for the duration of the COVID-19 declaration under Section 564(b)(1) of the Act, 21 U.S.C.section 360bbb-3(b)(1), unless the authorization is terminated  or revoked sooner.       Influenza A by PCR NEGATIVE NEGATIVE Final   Influenza B by PCR NEGATIVE NEGATIVE Final    Comment: (NOTE) The Xpert Xpress SARS-CoV-2/FLU/RSV plus assay is intended as an aid in the diagnosis of influenza from Nasopharyngeal swab specimens and should not be used as a sole  basis for treatment. Nasal washings and aspirates are unacceptable for Xpert Xpress SARS-CoV-2/FLU/RSV testing.  Fact Sheet for Patients: BloggerCourse.com  Fact Sheet for Healthcare Providers: SeriousBroker.it  This test is not yet approved or cleared by the [place] FDA and has been authorized for detection and/or diagnosis of SARS-CoV-2 by FDA under an Emergency Use Authorization (EUA). This EUA will remain in effect (meaning this test can be used) for the duration of the COVID-19 declaration under Section 564(b)(1) of the Act, 21 U.S.C. section 360bbb-3(b)(1), unless the authorization is terminated or revoked.     Resp Syncytial Virus by PCR NEGATIVE NEGATIVE Final    Comment: (NOTE) Fact Sheet for Patients: BloggerCourse.com  Fact Sheet for Healthcare Providers: SeriousBroker.it  This test is not yet approved or cleared by the [place] FDA and has been authorized for detection and/or diagnosis of SARS-CoV-2 by FDA under an Emergency Use Authorization (EUA). This EUA will remain in effect (meaning this test can be used) for the duration of the COVID-19 declaration under Section 564(b)(1) of the Act, 21 U.S.C. section 360bbb-3(b)(1), unless the authorization is terminated or revoked.  Performed at [facility], [address] [place], [place] [address]     Labs: CBC: Recent Labs  Lab 06/04/23 1308 06/05/23 0621  WBC 5.5 5.5  NEUTROABS 4.5  --   HGB 13.0 10.7*  HCT 40.5 33.2*  MCV 90.0 89.7  PLT 313 280   Basic Metabolic Panel: Recent Labs  Lab 06/04/23 1308 06/05/23 0621 06/06/23 0613 06/07/23 0517  NA 133* 137 137 138  K 4.3 3.6 3.3* 3.2*  CL 101 103 101 101  CO2 23 24 24 27   GLUCOSE 163* 98 110* 124*  BUN 32* 30* 34* 39*  CREATININE 1.88* 1.78* 1.88* 2.21*  CALCIUM 8.8* 7.7* 8.2* 7.8*   Liver Function Tests: Recent Labs  Lab  06/05/23 0621  AST 21  ALT 19  ALKPHOS 82  BILITOT 0.3  PROT 5.1*  ALBUMIN 2.4*   CBG: Recent Labs  Lab 06/06/23 1133 06/06/23 1556 06/06/23 2131 06/07/23 0744 06/07/23 1134  GLUCAP 116* 155* 147* 122* 93    Discharge time spent: greater than 30 minutes.  This record has been created using Conservation officer, historic buildings. Errors have been sought and corrected,but may not always be located. Such creation errors do not reflect on the standard of care.   Signed: Arnetha Courser, MD [facility] 06/07/2023

## 2023-06-07 NOTE — Plan of Care (Signed)
  Problem: Education: Goal: Ability to demonstrate management of disease process will improve Outcome: Adequate for Discharge Goal: Ability to verbalize understanding of medication therapies will improve Outcome: Adequate for Discharge Goal: Individualized Educational Video(s) Outcome: Adequate for Discharge   Problem: Activity: Goal: Capacity to carry out activities will improve Outcome: Adequate for Discharge   Problem: Cardiac: Goal: Ability to achieve and maintain adequate cardiopulmonary perfusion will improve Outcome: Adequate for Discharge   Problem: Education: Goal: Ability to describe self-care measures that may prevent or decrease complications (Diabetes Survival Skills Education) will improve Outcome: Adequate for Discharge Goal: Individualized Educational Video(s) Outcome: Adequate for Discharge   Problem: Coping: Goal: Ability to adjust to condition or change in health will improve Outcome: Adequate for Discharge   Problem: Fluid Volume: Goal: Ability to maintain a balanced intake and output will improve Outcome: Adequate for Discharge   Problem: Health Behavior/Discharge Planning: Goal: Ability to identify and utilize available resources and services will improve Outcome: Adequate for Discharge Goal: Ability to manage health-related needs will improve Outcome: Adequate for Discharge   Problem: Metabolic: Goal: Ability to maintain appropriate glucose levels will improve Outcome: Adequate for Discharge   Problem: Nutritional: Goal: Maintenance of adequate nutrition will improve Outcome: Adequate for Discharge Goal: Progress toward achieving an optimal weight will improve Outcome: Adequate for Discharge   Problem: Skin Integrity: Goal: Risk for impaired skin integrity will decrease Outcome: Adequate for Discharge   Problem: Tissue Perfusion: Goal: Adequacy of tissue perfusion will improve Outcome: Adequate for Discharge   Problem: Education: Goal:  Knowledge of General Education information will improve Description: Including pain rating scale, medication(s)/side effects and non-pharmacologic comfort measures Outcome: Adequate for Discharge   Problem: Health Behavior/Discharge Planning: Goal: Ability to manage health-related needs will improve Outcome: Adequate for Discharge   Problem: Clinical Measurements: Goal: Ability to maintain clinical measurements within normal limits will improve Outcome: Adequate for Discharge Goal: Will remain free from infection Outcome: Adequate for Discharge Goal: Diagnostic test results will improve Outcome: Adequate for Discharge Goal: Respiratory complications will improve Outcome: Adequate for Discharge Goal: Cardiovascular complication will be avoided Outcome: Adequate for Discharge   Problem: Activity: Goal: Risk for activity intolerance will decrease Outcome: Adequate for Discharge   Problem: Nutrition: Goal: Adequate nutrition will be maintained Outcome: Adequate for Discharge   Problem: Coping: Goal: Level of anxiety will decrease Outcome: Adequate for Discharge   Problem: Elimination: Goal: Will not experience complications related to bowel motility Outcome: Adequate for Discharge Goal: Will not experience complications related to urinary retention Outcome: Adequate for Discharge   Problem: Pain Management: Goal: General experience of comfort will improve Outcome: Adequate for Discharge   Problem: Safety: Goal: Ability to remain free from injury will improve Outcome: Adequate for Discharge   Problem: Skin Integrity: Goal: Risk for impaired skin integrity will decrease Outcome: Adequate for Discharge

## 2023-06-07 NOTE — Care Management Important Message (Signed)
Important Message  Patient Details  Name: Leslie Parker MRN: [id number] Date of Birth: Apr 16, 1933   Important Message Given:  N/A - LOS <3 / Initial given by admissions     [name] [name] 06/07/2023, 12:36 PM

## 2023-06-07 NOTE — Progress Notes (Signed)
Discharge instructions reviewed and provided to patient . PIV removed. All questions answered.

## 2023-06-10 ENCOUNTER — Telehealth: Payer: Self-pay | Admitting: *Deleted

## 2023-06-10 NOTE — Patient Outreach (Signed)
  Care Coordination   06/10/2023 Name: NAYIMA HOLLIMON MRN: [id number] DOB: [birth year]   Care Coordination Outreach Attempts:  An unsuccessful telephone outreach was attempted today to offer the patient information about available care coordination services.  Follow Up Plan:  Additional outreach attempts will be made to offer the patient care coordination information and services.   Encounter Outcome:  No Answer   Care Coordination Interventions:  No, not indicated    Rodney Langton, RN, MSN, CCM [place]  [facility], [facility] RN Care Coordinator Direct Dial: [phone] / Main [phone] Fax [phone] Email: [email or web address]: [email or web address]

## 2023-06-13 ENCOUNTER — Encounter: Payer: Medicare HMO | Admitting: Family

## 2023-06-14 ENCOUNTER — Telehealth: Payer: Self-pay | Admitting: *Deleted

## 2023-06-14 NOTE — Patient Instructions (Signed)
Visit Information  Thank you for taking time to visit with me today. Please don't hesitate to contact me if I can be of assistance to you before our next scheduled telephone appointment.  Following are the goals we discussed today:  Call cardiology and heart failure clinics to schedule follow up. Read attached information regarding HF management.  Our next appointment is by telephone on 11/26  Please call the care guide team at [phone] if you need to cancel or reschedule your appointment.   Please call the Suicide and Crisis Lifeline: 988 call the [place] National Suicide Prevention Lifeline: 4707496445 or TTY: 912-601-7505 TTY (332) 103-2926) to talk to a trained counselor call 1-800-273-TALK (toll free, 24 hour hotline) call 911 if you are experiencing a Mental Health or Behavioral Health Crisis or need someone to talk to.  The patient verbalized understanding of instructions, educational materials, and care plan provided today and agreed to receive a mailed copy of patient instructions, educational materials, and care plan.   The patient has been provided with contact information for the care management team and has been advised to call with any health related questions or concerns.   Rodney Langton, RN, MSN, CCM [facility], [facility] RN Care Coordinator Direct Dial: [phone] / Main [phone] Fax [phone] Email: [email or web address] Website: [email or web address]

## 2023-06-14 NOTE — Patient Outreach (Signed)
  Care Coordination   Follow Up Visit Note   06/14/2023 Name: Leslie Parker MRN: [id number] DOB: 1932/09/11  Leslie Parker is a 89 y.o. year old female who sees Leslie Baas, MD for primary care. I spoke with  Hendricks Parker by phone today.  What matters to the patients health and wellness today?  Discharged from hospital on 11/15, denies having [facility] restart services yet.  She is currently at home alone, but state her Leslie Parker will be coming in from out of town to help with her care.  Denies any urgent concerns, encouraged to contact this care manager with questions.      Goals Addressed             This Visit's Progress    Free from recurrent fall   On track    Care Coordination Interventions: Provided written and verbal education re: potential causes of falls and Fall prevention strategies Reviewed medications and discussed potential side effects of medications such as dizziness and frequent urination Advised patient of importance of notifying provider of falls Assessed for falls since last encounter Assessed patients knowledge of fall risk prevention secondary to previously provided education      Management of chronic health conditions   On track      Interventions Today    Flowsheet Row Most Recent Value  Chronic Disease   Chronic disease during today's visit Chronic Obstructive Pulmonary Disease (COPD), Congestive Heart Failure (CHF), Atrial Fibrillation (AFib)  General Interventions   General Interventions Discussed/Reviewed General Interventions Reviewed, Doctor Visits, Communication with, Durable Medical Equipment (DME)  Doctor Visits Discussed/Reviewed Doctor Visits Reviewed, PCP, Specialist  [PCP scheduled for 11/25, aware of need for cardiology and HF appts, state she is waiting on her Leslie Parker's schedule but will call to secure appts]  Durable Medical Equipment (DME) BP Cuff, Other  [scale]  PCP/Specialist Visits Compliance with follow-up visit, Contact  provider for referral to  Contacted provider for referral to --  [facility] PCP office to request order to be sent to [facility] to restart [facility] services (PT/OT/RN).  Fax number provided [phone])]  Communication with PCP/Specialists  [Called [facility], [place], notified they have not received resumption of care orders]  Exercise Interventions   Exercise Discussed/Reviewed Physical Activity, Weight Managment  Physical Activity Discussed/Reviewed Physical Activity Reviewed  [Awaiting call to restart PT/OT]  Weight Management Weight maintenance  [Weighing daily, today was 182 pounds]  Education Interventions   Education Provided Provided Education, Provided Printed Education  Provided Verbal Education On Nutrition, Medication, When to see the doctor, Other  [Discussed HF action plan and proper administration of Lasix.  Denies swelling]  Nutrition Interventions   Nutrition Discussed/Reviewed Nutrition Reviewed, Fluid intake, Decreasing salt, Decreasing fats              SDOH assessments and interventions completed:  No     Care Coordination Interventions:  Yes, provided   Follow up plan: Follow up call scheduled for 11/26    Encounter Outcome:  Patient Visit Completed   Rodney Langton, RN, MSN, CCM [place]  [facility], [facility] RN Care Coordinator Direct Dial: [phone] / Main [phone] Fax [phone] Email: [email or web address]

## 2023-06-15 DIAGNOSIS — I110 Hypertensive heart disease with heart failure: Secondary | ICD-10-CM | POA: Diagnosis not present

## 2023-06-15 DIAGNOSIS — I5033 Acute on chronic diastolic (congestive) heart failure: Secondary | ICD-10-CM | POA: Diagnosis not present

## 2023-06-15 DIAGNOSIS — N184 Chronic kidney disease, stage 4 (severe): Secondary | ICD-10-CM | POA: Diagnosis not present

## 2023-06-15 DIAGNOSIS — I5A Non-ischemic myocardial injury (non-traumatic): Secondary | ICD-10-CM | POA: Diagnosis not present

## 2023-06-15 DIAGNOSIS — J9601 Acute respiratory failure with hypoxia: Secondary | ICD-10-CM | POA: Diagnosis not present

## 2023-06-15 DIAGNOSIS — J441 Chronic obstructive pulmonary disease with (acute) exacerbation: Secondary | ICD-10-CM | POA: Diagnosis not present

## 2023-06-15 DIAGNOSIS — D631 Anemia in chronic kidney disease: Secondary | ICD-10-CM | POA: Diagnosis not present

## 2023-06-15 DIAGNOSIS — E1122 Type 2 diabetes mellitus with diabetic chronic kidney disease: Secondary | ICD-10-CM | POA: Diagnosis not present

## 2023-06-15 DIAGNOSIS — N179 Acute kidney failure, unspecified: Secondary | ICD-10-CM | POA: Diagnosis not present

## 2023-06-16 ENCOUNTER — Emergency Department: Payer: Medicare HMO

## 2023-06-16 ENCOUNTER — Inpatient Hospital Stay
Admission: EM | Admit: 2023-06-16 | Discharge: 2023-06-19 | DRG: 291 | Disposition: A | Discharge status: Home Health | Payer: Medicare HMO | Attending: Internal Medicine | Admitting: Internal Medicine

## 2023-06-16 ENCOUNTER — Other Ambulatory Visit: Payer: Self-pay

## 2023-06-16 DIAGNOSIS — E876 Hypokalemia: Secondary | ICD-10-CM | POA: Diagnosis present

## 2023-06-16 DIAGNOSIS — R0689 Other abnormalities of breathing: Secondary | ICD-10-CM | POA: Diagnosis not present

## 2023-06-16 DIAGNOSIS — I509 Heart failure, unspecified: Secondary | ICD-10-CM | POA: Diagnosis present

## 2023-06-16 DIAGNOSIS — I2489 Other forms of acute ischemic heart disease: Secondary | ICD-10-CM | POA: Diagnosis present

## 2023-06-16 DIAGNOSIS — M8580 Other specified disorders of bone density and structure, unspecified site: Secondary | ICD-10-CM | POA: Diagnosis present

## 2023-06-16 DIAGNOSIS — Z9841 Cataract extraction status, right eye: Secondary | ICD-10-CM

## 2023-06-16 DIAGNOSIS — R069 Unspecified abnormalities of breathing: Secondary | ICD-10-CM | POA: Diagnosis not present

## 2023-06-16 DIAGNOSIS — R0600 Dyspnea, unspecified: Secondary | ICD-10-CM | POA: Diagnosis not present

## 2023-06-16 DIAGNOSIS — I480 Paroxysmal atrial fibrillation: Secondary | ICD-10-CM | POA: Diagnosis present

## 2023-06-16 DIAGNOSIS — E785 Hyperlipidemia, unspecified: Secondary | ICD-10-CM | POA: Diagnosis present

## 2023-06-16 DIAGNOSIS — I2510 Atherosclerotic heart disease of native coronary artery without angina pectoris: Secondary | ICD-10-CM | POA: Diagnosis present

## 2023-06-16 DIAGNOSIS — Z96653 Presence of artificial knee joint, bilateral: Secondary | ICD-10-CM | POA: Diagnosis present

## 2023-06-16 DIAGNOSIS — N179 Acute kidney failure, unspecified: Secondary | ICD-10-CM | POA: Diagnosis not present

## 2023-06-16 DIAGNOSIS — Z8249 Family history of ischemic heart disease and other diseases of the circulatory system: Secondary | ICD-10-CM | POA: Diagnosis not present

## 2023-06-16 DIAGNOSIS — Z825 Family history of asthma and other chronic lower respiratory diseases: Secondary | ICD-10-CM

## 2023-06-16 DIAGNOSIS — Z888 Allergy status to other drugs, medicaments and biological substances status: Secondary | ICD-10-CM

## 2023-06-16 DIAGNOSIS — I5033 Acute on chronic diastolic (congestive) heart failure: Secondary | ICD-10-CM | POA: Diagnosis present

## 2023-06-16 DIAGNOSIS — Z7984 Long term (current) use of oral hypoglycemic drugs: Secondary | ICD-10-CM | POA: Diagnosis not present

## 2023-06-16 DIAGNOSIS — Z961 Presence of intraocular lens: Secondary | ICD-10-CM | POA: Diagnosis present

## 2023-06-16 DIAGNOSIS — I161 Hypertensive emergency: Secondary | ICD-10-CM | POA: Diagnosis present

## 2023-06-16 DIAGNOSIS — E1122 Type 2 diabetes mellitus with diabetic chronic kidney disease: Secondary | ICD-10-CM | POA: Diagnosis present

## 2023-06-16 DIAGNOSIS — I130 Hypertensive heart and chronic kidney disease with heart failure and stage 1 through stage 4 chronic kidney disease, or unspecified chronic kidney disease: Secondary | ICD-10-CM | POA: Diagnosis present

## 2023-06-16 DIAGNOSIS — J4489 Other specified chronic obstructive pulmonary disease: Secondary | ICD-10-CM | POA: Diagnosis present

## 2023-06-16 DIAGNOSIS — Z79899 Other long term (current) drug therapy: Secondary | ICD-10-CM

## 2023-06-16 DIAGNOSIS — Z9842 Cataract extraction status, left eye: Secondary | ICD-10-CM

## 2023-06-16 DIAGNOSIS — R0602 Shortness of breath: Secondary | ICD-10-CM | POA: Diagnosis present

## 2023-06-16 DIAGNOSIS — R0902 Hypoxemia: Secondary | ICD-10-CM | POA: Diagnosis not present

## 2023-06-16 DIAGNOSIS — Z950 Presence of cardiac pacemaker: Secondary | ICD-10-CM | POA: Diagnosis not present

## 2023-06-16 DIAGNOSIS — J9601 Acute respiratory failure with hypoxia: Secondary | ICD-10-CM | POA: Diagnosis present

## 2023-06-16 DIAGNOSIS — Z96611 Presence of right artificial shoulder joint: Secondary | ICD-10-CM | POA: Diagnosis present

## 2023-06-16 DIAGNOSIS — Z90710 Acquired absence of both cervix and uterus: Secondary | ICD-10-CM | POA: Diagnosis not present

## 2023-06-16 DIAGNOSIS — Z85828 Personal history of other malignant neoplasm of skin: Secondary | ICD-10-CM | POA: Diagnosis not present

## 2023-06-16 DIAGNOSIS — I495 Sick sinus syndrome: Secondary | ICD-10-CM | POA: Diagnosis present

## 2023-06-16 DIAGNOSIS — I10 Essential (primary) hypertension: Secondary | ICD-10-CM | POA: Diagnosis not present

## 2023-06-16 DIAGNOSIS — Z7901 Long term (current) use of anticoagulants: Secondary | ICD-10-CM

## 2023-06-16 DIAGNOSIS — N1832 Chronic kidney disease, stage 3b: Secondary | ICD-10-CM | POA: Diagnosis present

## 2023-06-16 DIAGNOSIS — R062 Wheezing: Secondary | ICD-10-CM | POA: Diagnosis not present

## 2023-06-16 DIAGNOSIS — Z9049 Acquired absence of other specified parts of digestive tract: Secondary | ICD-10-CM

## 2023-06-16 DIAGNOSIS — R918 Other nonspecific abnormal finding of lung field: Secondary | ICD-10-CM | POA: Diagnosis not present

## 2023-06-16 DIAGNOSIS — I110 Hypertensive heart disease with heart failure: Secondary | ICD-10-CM | POA: Diagnosis not present

## 2023-06-16 LAB — COMPREHENSIVE METABOLIC PANEL
ALT: 36 U/L (ref 0–44)
AST: 47 U/L — ABNORMAL HIGH (ref 15–41)
Albumin: 3.4 g/dL — ABNORMAL LOW (ref 3.5–5.0)
Alkaline Phosphatase: 114 U/L (ref 38–126)
Anion gap: 12 (ref 5–15)
BUN: 41 mg/dL — ABNORMAL HIGH (ref 8–23)
CO2: 21 mmol/L — ABNORMAL LOW (ref 22–32)
Calcium: 8.7 mg/dL — ABNORMAL LOW (ref 8.9–10.3)
Chloride: 102 mmol/L (ref 98–111)
Creatinine, Ser: 2.01 mg/dL — ABNORMAL HIGH (ref 0.44–1.00)
GFR, Estimated: 23 mL/min — ABNORMAL LOW (ref >60)
Glucose, Bld: 144 mg/dL — ABNORMAL HIGH (ref 70–99)
Potassium: 4 mmol/L (ref 3.5–5.1)
Sodium: 135 mmol/L (ref 135–145)
Total Bilirubin: 0.6 mg/dL (ref <1.2)
Total Protein: 6.9 g/dL (ref 6.5–8.1)

## 2023-06-16 LAB — CBC
HCT: 40.5 % (ref 36.0–46.0)
Hemoglobin: 13.2 g/dL (ref 12.0–15.0)
MCH: 28.4 pg (ref 26.0–34.0)
MCHC: 32.6 g/dL (ref 30.0–36.0)
MCV: 87.1 fL (ref 80.0–100.0)
Platelets: 288 10*3/uL (ref 150–400)
RBC: 4.65 MIL/uL (ref 3.87–5.11)
RDW: 14.1 % (ref 11.5–15.5)
WBC: 6.7 10*3/uL (ref 4.0–10.5)
nRBC: 0 % (ref 0.0–0.2)

## 2023-06-16 LAB — TROPONIN I (HIGH SENSITIVITY): Troponin I (High Sensitivity): 18 ng/L — ABNORMAL HIGH (ref <18)

## 2023-06-16 LAB — BRAIN NATRIURETIC PEPTIDE: B Natriuretic Peptide: 1602.3 pg/mL — ABNORMAL HIGH (ref 0.0–100.0)

## 2023-06-16 MED ORDER — FUROSEMIDE 10 MG/ML IJ SOLN
60.0000 mg | Freq: Two times a day (BID) | INTRAMUSCULAR | Status: DC
  Administered 2023-06-16 – 2023-06-19 (×6): 60 mg via INTRAVENOUS
  Filled 2023-06-16 (×3): qty 6
  Filled 2023-06-16: qty 8
  Filled 2023-06-16: qty 6
  Filled 2023-06-16: qty 8

## 2023-06-16 MED ORDER — SODIUM CHLORIDE 0.9% FLUSH
3.0000 mL | Freq: Two times a day (BID) | INTRAVENOUS | Status: DC
  Administered 2023-06-16 – 2023-06-19 (×6): 3 mL via INTRAVENOUS

## 2023-06-16 MED ORDER — ONDANSETRON HCL 4 MG/2ML IJ SOLN: 4.0000 mg | Freq: Four times a day (QID) | INTRAMUSCULAR | Status: DC | PRN

## 2023-06-16 MED ORDER — ADULT MULTIVITAMIN W/MINERALS CH
1.0000 | ORAL_TABLET | Freq: Every day | ORAL | Status: DC
  Administered 2023-06-17 – 2023-06-19 (×3): 1 via ORAL
  Filled 2023-06-16 (×3): qty 1

## 2023-06-16 MED ORDER — HYDRALAZINE HCL 20 MG/ML IJ SOLN
5.0000 mg | Freq: Four times a day (QID) | INTRAMUSCULAR | Status: DC | PRN
Start: 2023-06-16 — End: 2023-06-19

## 2023-06-16 MED ORDER — TRAZODONE HCL 100 MG PO TABS
100.0000 mg | ORAL_TABLET | Freq: Every day | ORAL | Status: DC
  Administered 2023-06-16 – 2023-06-18 (×3): 100 mg via ORAL
  Filled 2023-06-16 (×3): qty 1

## 2023-06-16 MED ORDER — CARVEDILOL 3.125 MG PO TABS
3.1250 mg | ORAL_TABLET | Freq: Two times a day (BID) | ORAL | Status: DC
  Administered 2023-06-16 – 2023-06-19 (×6): 3.125 mg via ORAL
  Filled 2023-06-16 (×6): qty 1

## 2023-06-16 MED ORDER — MAGNESIUM OXIDE -MG SUPPLEMENT 400 (240 MG) MG PO TABS
400.0000 mg | ORAL_TABLET | Freq: Every day | ORAL | Status: DC
  Administered 2023-06-17 – 2023-06-19 (×3): 400 mg via ORAL
  Filled 2023-06-16 (×3): qty 1

## 2023-06-16 MED ORDER — ACETAMINOPHEN 325 MG PO TABS
650.0000 mg | ORAL_TABLET | ORAL | Status: DC | PRN
  Administered 2023-06-17: 650 mg via ORAL
  Filled 2023-06-16: qty 2

## 2023-06-16 MED ORDER — APIXABAN 2.5 MG PO TABS
2.5000 mg | ORAL_TABLET | Freq: Two times a day (BID) | ORAL | Status: DC
  Administered 2023-06-16 – 2023-06-19 (×6): 2.5 mg via ORAL
  Filled 2023-06-16 (×6): qty 1

## 2023-06-16 MED ORDER — ASCORBIC ACID 500 MG PO TABS
500.0000 mg | ORAL_TABLET | Freq: Every day | ORAL | Status: DC
  Administered 2023-06-17 – 2023-06-19 (×3): 500 mg via ORAL
  Filled 2023-06-16 (×6): qty 1

## 2023-06-16 MED ORDER — NITROGLYCERIN 2 % TD OINT
1.0000 [in_us] | TOPICAL_OINTMENT | Freq: Once | TRANSDERMAL | Status: AC
  Administered 2023-06-16: 1 [in_us] via TOPICAL
  Filled 2023-06-16: qty 1

## 2023-06-16 MED ORDER — ONDANSETRON 4 MG PO TBDP: 4.0000 mg | ORAL_TABLET | Freq: Three times a day (TID) | ORAL | Status: DC | PRN

## 2023-06-16 MED ORDER — COLESTIPOL HCL 1 G PO TABS
1.0000 g | ORAL_TABLET | Freq: Two times a day (BID) | ORAL | Status: DC
  Administered 2023-06-16 – 2023-06-19 (×6): 1 g via ORAL
  Filled 2023-06-16 (×6): qty 1

## 2023-06-16 MED ORDER — SODIUM CHLORIDE 0.9% FLUSH: 3.0000 mL | INTRAVENOUS | Status: DC | PRN

## 2023-06-16 MED ORDER — PANTOPRAZOLE SODIUM 40 MG PO TBEC
40.0000 mg | DELAYED_RELEASE_TABLET | Freq: Every day | ORAL | Status: DC
  Administered 2023-06-16 – 2023-06-19 (×4): 40 mg via ORAL
  Filled 2023-06-16 (×4): qty 1

## 2023-06-16 MED ORDER — GLIPIZIDE ER 2.5 MG PO TB24
2.5000 mg | ORAL_TABLET | Freq: Every day | ORAL | Status: DC
  Administered 2023-06-17 – 2023-06-19 (×3): 2.5 mg via ORAL
  Filled 2023-06-16 (×3): qty 1

## 2023-06-16 MED ORDER — VITAMIN D 25 MCG (1000 UNIT) PO TABS
1000.0000 [IU] | ORAL_TABLET | Freq: Every day | ORAL | Status: DC
  Administered 2023-06-17 – 2023-06-19 (×3): 1000 [IU] via ORAL
  Filled 2023-06-16 (×3): qty 1

## 2023-06-16 MED ORDER — HYDRALAZINE HCL 20 MG/ML IJ SOLN
5.0000 mg | Freq: Four times a day (QID) | INTRAMUSCULAR | Status: DC | PRN
Start: 2023-06-16 — End: 2023-06-16

## 2023-06-16 MED ORDER — SODIUM CHLORIDE 0.9 % IV SOLN: 250.0000 mL | INTRAVENOUS | Status: AC | PRN

## 2023-06-16 MED ORDER — HYDRALAZINE HCL 50 MG PO TABS
50.0000 mg | ORAL_TABLET | Freq: Three times a day (TID) | ORAL | Status: DC
  Administered 2023-06-16 – 2023-06-19 (×9): 50 mg via ORAL
  Filled 2023-06-16 (×9): qty 1

## 2023-06-16 MED ORDER — HYDRALAZINE HCL 20 MG/ML IJ SOLN
10.0000 mg | Freq: Once | INTRAMUSCULAR | Status: AC
  Administered 2023-06-16: 10 mg via INTRAVENOUS
  Filled 2023-06-16: qty 1

## 2023-06-16 MED ORDER — ISOSORBIDE MONONITRATE ER 30 MG PO TB24
30.0000 mg | ORAL_TABLET | Freq: Every day | ORAL | Status: DC
  Administered 2023-06-16 – 2023-06-19 (×4): 30 mg via ORAL
  Filled 2023-06-16 (×4): qty 1

## 2023-06-16 MED ORDER — AMIODARONE HCL 200 MG PO TABS
200.0000 mg | ORAL_TABLET | Freq: Every day | ORAL | Status: DC
  Administered 2023-06-16 – 2023-06-19 (×4): 200 mg via ORAL
  Filled 2023-06-16 (×4): qty 1

## 2023-06-16 MED ORDER — FUROSEMIDE 10 MG/ML IJ SOLN
60.0000 mg | Freq: Once | INTRAMUSCULAR | Status: AC
  Administered 2023-06-16: 60 mg via INTRAVENOUS
  Filled 2023-06-16: qty 8

## 2023-06-16 NOTE — H&P (Signed)
History and Physical    Leslie Parker UXN:[id number] DOB: 1933/05/17 DOA: 06/16/2023  PCP: [name], MD (Confirm with patient/family/NH records and if not entered, this has to be entered at [facility] point of entry) Patient coming from: Assisted living  I have personally briefly reviewed patient's old medical records in [facility]  Chief Complaint: Shortness of breath  HPI: Leslie Parker is a 89 y.o. female with medical history significant of chronic HFpEF, sick sinus syndrome status post PPM, PAF on Eliquis, IIDM, CKD stage IIIb, COPD Gold stage IV, presented with worsening of shortness of breath.  Symptoms started last night patient developed shortness of breath and could not lie flat on the bed denied any cough no chest pain no fever or chills.  Patient reported that she has been compliant with her CHF and BP medications and at baseline her blood pressure runs SBP in the 130s.  This morning, patient was found to be hypoxic 87% on room air and EMS was called.  EMS arrived who gave patient 1 DuoNeb and 1 albuterol and Solu-Medrol 125 mg and put patient on CPAP.   ED Course: Blood pressure significant elevated 206/97, heart rate lower 60s, O2 saturation 98% on BiPAP.  Chest x-ray showed pulmonary congestion and new bilateral pleural effusions more month.  Patient was given Lasix 60 mg x 1 in the ED.  Review of Systems: As per HPI otherwise 14 point review of systems negative.    Past Medical History:  Diagnosis Date   Asthma    Atrial fibrillation with RVR (HCC)    Cancer (HCC)    Basal Cell   Diabetes mellitus without complication (HCC)    Heart murmur    Hypertension    Impetigo    Osteoarthritis    Osteopenia    Vomiting    can not due to surgery   Wears dentures    full upper and lower    Past Surgical History:  Procedure Laterality Date   ABDOMINAL HYSTERECTOMY     BACK SURGERY     BLADDER SURGERY     mesh   CARPAL TUNNEL RELEASE Bilateral    CATARACT  EXTRACTION Right 2017   CATARACT EXTRACTION W/PHACO Left 02/06/2016   Procedure: CATARACT EXTRACTION PHACO AND INTRAOCULAR LENS PLACEMENT (IOC) left eye;  Surgeon: Sherald Hess, MD;  Location: [facility];  Service: Ophthalmology;  Laterality: Left;  DIABETIC LEFT Cannot arrive before 9:30   CERVICAL DISC SURGERY     CHOLECYSTECTOMY     EYE SURGERY Bilateral    Cataract Extraction with IOL   FEMUR IM NAIL Left 02/04/2015   Procedure: INTRAMEDULLARY (IM) NAIL FEMORAL;  Surgeon: Juanell Fairly, MD;  Location: [facility];  Service: Orthopedics;  Laterality: Left;   HARDWARE REMOVAL Left 01/17/2017   Procedure: HARDWARE REMOVAL;  Surgeon: Juanell Fairly, MD;  Location: [facility];  Service: Orthopedics;  Laterality: Left;   HERNIA REPAIR  2014   esophageal and gastric mesh. patient unable to throw up d/t mesh   HIP ARTHROPLASTY Left 01/26/2017   Procedure: ARTHROPLASTY BIPOLAR HIP (HEMIARTHROPLASTY) removal hardware left hip;  Surgeon: Deeann Saint, MD;  Location: [facility];  Service: Orthopedics;  Laterality: Left;   INTRAMEDULLARY (IM) NAIL INTERTROCHANTERIC Right 02/13/2023   Procedure: INTRAMEDULLARY (IM) NAIL INTERTROCHANTERIC;  Surgeon: Christena Flake, MD;  Location: [facility];  Service: Orthopedics;  Laterality: Right;   JOINT REPLACEMENT Bilateral    knees   PACEMAKER IMPLANT N/A 11/15/2022   Procedure: PACEMAKER  IMPLANT;  Surgeon: Marcina Millard, MD;  Location: [facility];  Service: Cardiovascular;  Laterality: N/A;   PACEMAKER IMPLANT N/A 11/28/2022   Procedure: PACEMAKER IMPLANT;  Surgeon: Marcina Millard, MD;  Location: [facility];  Service: Cardiovascular;  Laterality: N/A;  Lead reposition   REPLACEMENT TOTAL KNEE BILATERAL Bilateral [id number]   SHOULDER ARTHROSCOPY WITH OPEN ROTATOR CUFF REPAIR AND DISTAL CLAVICLE ACROMINECTOMY Left 10/25/2016   Procedure: SHOULDER ARTHROSCOPY WITH OPEN ROTATOR CUFF REPAIR AND DISTAL CLAVICLE ACROMINECTOMY;   Surgeon: Juanell Fairly, MD;  Location: [facility];  Service: Orthopedics;  Laterality: Left;   THYROID SURGERY     goiter removed   TOTAL HIP REVISION Left 12/02/2017   Procedure: TOTAL HIP REVISION;  Surgeon: Lyndle Herrlich, MD;  Location: [facility];  Service: Orthopedics;  Laterality: Left;   TOTAL SHOULDER REPLACEMENT Right 2012     reports that she has never smoked. She has never used smokeless tobacco. She reports that she does not drink alcohol and does not use drugs.  Allergies  Allergen Reactions   Baclofen Other (See Comments)    Elevated Cr   Simvastatin Other (See Comments)    Pt denies  Elevated LFTs with simva 40mg , stopped and resolved (see MD note 11/10/13)    Family History  Problem Relation Age of Onset   Heart failure Mother    Hypertension Mother    Emphysema Father      Prior to Admission medications   Medication Sig Start Date End Date Taking? Authorizing Provider  albuterol (VENTOLIN HFA) 108 (90 Base) MCG/ACT inhaler Inhale 2 puffs into the lungs every 6 (six) hours as needed. 08/04/19   [provider]  amiodarone (PACERONE) 200 MG tablet Take 200 mg by mouth daily.    [provider]  apixaban (ELIQUIS) 2.5 MG TABS tablet Take 1 tablet (2.5 mg total) by mouth 2 (two) times daily. 06/07/22   Tresa Moore, MD  ascorbic acid (VITAMIN C) 250 MG CHEW Chew 250 mg by mouth daily.    [provider]  Cholecalciferol (VITAMIN D-1000 MAX ST) 25 MCG (1000 UT) tablet Take 1,000 Units by mouth daily.    [provider]  colestipol (COLESTID) 1 g tablet Take 1 tablet by mouth 2 (two) times daily. 06/01/22   [provider]  Cyanocobalamin (B-12) 2500 MCG TABS Take 2,500 mcg by mouth daily.    [provider]  diltiazem (CARDIZEM CD) 360 MG 24 hr capsule Take 360 mg by mouth daily. 11/23/22   [provider]  furosemide (LASIX) 20 MG tablet Take 1 tablet (20 mg total) by mouth daily. 06/07/23   Arnetha Courser, MD  glipiZIDE (GLUCOTROL XL) 2.5 MG 24 hr tablet Take 2.5 mg by mouth daily with breakfast. 03/27/22   [provider]  hydrALAZINE (APRESOLINE) 25 MG tablet Take 25 mg by mouth 3 (three) times daily as needed. Take 1 tablet (25 mg total) by mouth 3 (three) times daily as needed (for SBP >150 conistently or DBP>100 persistently) 05/31/23 05/30/24  [provider]  ipratropium-albuterol (DUONEB) 0.5-2.5 (3) MG/3ML SOLN Inhale 3 mLs into the lungs every 6 (six) hours as needed. 06/01/22   [provider]  Magnesium Oxide 500 MG CAPS Take 500 mg by mouth daily.    [provider]  metoprolol tartrate (LOPRESSOR) 50 MG tablet Take 1 tablet by mouth 2 (two) times daily. 05/21/23 05/20/24  [provider]  Multiple Vitamin (MULTIVITAMIN WITH MINERALS) TABS tablet Take  1 tablet by mouth daily. One-A-Day Women's Vitamin    [provider]  omeprazole (PRILOSEC) 20 MG capsule Take 20 mg by mouth every morning. 01/10/23   [provider]  ondansetron (ZOFRAN-ODT) 4 MG disintegrating tablet Take 4 mg by mouth every 8 (eight) hours as needed. 05/21/23   [provider]  traZODone (DESYREL) 100 MG tablet Take 100 mg by mouth at bedtime.    [provider]  TRELEGY ELLIPTA 100-62.5-25 MCG/ACT AEPB Inhale 1 puff into the lungs daily. 08/03/22   [provider]    Physical Exam: Vitals:   06/16/23 0904 06/16/23 0908 06/16/23 0930  BP:  (!) 206/97 (!) 192/80  Pulse:  68 65  Resp:  (!) 27 (!) 22  Temp:  98.7 F (37.1 C)   TempSrc:  Oral   SpO2:  99% 100%  Weight: 93.1 kg    Height: 5\' 9"  (1.753 m)      Constitutional: NAD, calm, comfortable Vitals:   06/16/23 0904 06/16/23 0908 06/16/23 0930  BP:  (!) 206/97 (!) 192/80  Pulse:  68 65  Resp:  (!) 27 (!) 22  Temp:  98.7 F (37.1 C)   TempSrc:  Oral   SpO2:  99% 100%  Weight: 93.1 kg    Height: 5\' 9"  (1.753 m)     Eyes: PERRL, lids and conjunctivae  normal ENMT: Mucous membranes are moist. Posterior pharynx clear of any exudate or lesions.Normal dentition.  Neck: normal, supple, no masses, no thyromegaly Respiratory: clear to auscultation bilaterally, no wheezing, fine crackles on bilateral lower fields, increasing respiratory effort. No accessory muscle use.  Cardiovascular: Regular rate and rhythm, no murmurs / rubs / gallops. No extremity edema. 2+ pedal pulses. No carotid bruits.  Abdomen: no tenderness, no masses palpated. No hepatosplenomegaly. Bowel sounds positive.  Musculoskeletal: no clubbing / cyanosis. No joint deformity upper and lower extremities. Good ROM, no contractures. Normal muscle tone.  Skin: no rashes, lesions, ulcers. No induration Neurologic: CN 2-12 grossly intact. Sensation intact, DTR normal. Strength 5/5 in all 4.  Psychiatric: Normal judgment and insight. Alert and oriented x 3. Normal mood.     Labs on Admission: I have personally reviewed following labs and imaging studies  CBC: Recent Labs  Lab 06/16/23 0912  WBC 6.7  HGB 13.2  HCT 40.5  MCV 87.1  PLT 288   Basic Metabolic Panel: Recent Labs  Lab 06/16/23 0912  NA 135  K 4.0  CL 102  CO2 21*  GLUCOSE 144*  BUN 41*  CREATININE 2.01*  CALCIUM 8.7*   GFR: Estimated Creatinine Clearance: 23.5 mL/min (A) (by C-G formula based on SCr of 2.01 mg/dL (H)). Liver Function Tests: Recent Labs  Lab 06/16/23 0912  AST 47*  ALT 36  ALKPHOS 114  BILITOT 0.6  PROT 6.9  ALBUMIN 3.4*   No results for input(s): "LIPASE", "AMYLASE" in the last 168 hours. No results for input(s): "AMMONIA" in the last 168 hours. Coagulation Profile: No results for input(s): "INR", "PROTIME" in the last 168 hours. Cardiac Enzymes: No results for input(s): "CKTOTAL", "CKMB", "CKMBINDEX", "TROPONINI" in the last 168 hours. BNP (last 3 results) No results for input(s): "PROBNP" in the last 8760 hours. HbA1C: No results for input(s): "HGBA1C" in the last 72  hours. CBG: No results for input(s): "GLUCAP" in the last 168 hours. Lipid Profile: No results for input(s): "CHOL", "HDL", "LDLCALC", "TRIG", "CHOLHDL", "LDLDIRECT" in the last 72 hours. Thyroid Function Tests: No results for input(s): "TSH", "T4TOTAL", "  FREET4", "T3FREE", "THYROIDAB" in the last 72 hours. Anemia Panel: No results for input(s): "VITAMINB12", "FOLATE", "FERRITIN", "TIBC", "IRON", "RETICCTPCT" in the last 72 hours. Urine analysis:    Component Value Date/Time   COLORURINE AMBER (A) 11/24/2022 2239   APPEARANCEUR CLOUDY (A) 11/24/2022 2239   APPEARANCEUR Clear 02/19/2013 0254   LABSPEC 1.016 11/24/2022 2239   LABSPEC 1.006 02/19/2013 0254   PHURINE 5.0 11/24/2022 2239   GLUCOSEU NEGATIVE 11/24/2022 2239   GLUCOSEU Negative 02/19/2013 0254   HGBUR NEGATIVE 11/24/2022 2239   BILIRUBINUR NEGATIVE 11/24/2022 2239   BILIRUBINUR Negative 02/19/2013 0254   KETONESUR 5 (A) 11/24/2022 2239   PROTEINUR 100 (A) 11/24/2022 2239   NITRITE NEGATIVE 11/24/2022 2239   LEUKOCYTESUR MODERATE (A) 11/24/2022 2239   LEUKOCYTESUR 1+ 02/19/2013 0254    Radiological Exams on Admission: DG Chest Portable 1 View  Result Date: 06/16/2023 CLINICAL DATA:  Shortness of breath. EXAM: PORTABLE CHEST 1 VIEW COMPARISON:  06/04/2023 FINDINGS: Low volume film. The cardio pericardial silhouette is enlarged. Bibasilar atelectasis or infiltrate with small bilateral pleural effusions. Left permanent pacemaker again noted. Bones are diffusely demineralized. IMPRESSION: Low volume film with bibasilar atelectasis or infiltrate and small bilateral pleural effusions. Electronically Signed   By: Kennith Center M.D.   On: 06/16/2023 09:49    EKG: Independently reviewed.  Sinus, chronic LBBB  Assessment/Plan Principal Problem:   CHF (congestive heart failure) (HCC) Active Problems:   Acute on chronic diastolic CHF (congestive heart failure) (HCC)   SOB (shortness of breath)   Stage 3b chronic kidney  disease (HCC)  (please populate well all problems here in Problem List. (For example, if patient is on BP meds at home and you resume or decide to hold them, it is a problem that needs to be her. Same for CAD, COPD, HLD and so on)  Acute on chronic HFpEF decompensation -Likely secondary to HTN emergency. -Change her BP medication as: Hold off Cardizem as patient in CHF decompensation.  Change metoprolol to Coreg.  Increase hydralazine to 50 mg 3 times daily and add Imdur.  Start as needed hydralazine -Continue Lasix 60 mg IV twice daily and monitor kidney function. -Other DDx, no significant symptoms signs of COPD exacerbation, hold off further IV steroid  Acute hypoxic respiratory failure -Secondary to CHF decompensation, management as above  HTN emergency -As above.  PAF -In sinus rhythm, continue Eliquis -Amiodarone  IIDM -Continue glimepiride  CKD stage IIIb -Fluid overload, creatinine level stable, diuresis as above.  SSS -No acute concern  Deconditioning -PT OT evaluation  DVT prophylaxis: Eliquis Code Status: Full code Family Communication: None at bedside Disposition Plan: Expect less than 2 midnight hospital stay Consults called: None Admission status: Telemetry observation   Emeline General MD [facility] Pager [phone]  06/16/2023, 12:34 PM

## 2023-06-16 NOTE — ED Provider Notes (Signed)
[facility] Provider Note    Event Date/Time   First MD Initiated Contact with Patient 06/16/23 [phone]     (approximate)  History   Chief Complaint: Shortness of Breath  HPI  Leslie Parker is a 89 y.o. female with a past medical history of atrial fibrillation, diabetes, COPD, CHF who presents to the emergency department for shortness of breath.  Patient arrives via EMS emergency traffic on CPAP for shortness of breath.  Patient denies any O2 requirement at baseline.  EMS states O2 sats in the mid 80s on room air upon their arrival.  Currently satting 99% on CPAP.  Patient does have lower extremity edema but she states this is pretty typical for her.  Patient is hypertensive 206/97.  EMS dose Solu-Medrol and DuoNeb before arrival.  Physical Exam   Triage Vital Signs: ED Triage Vitals  Encounter Vitals Group     BP 06/16/23 0908 (!) 206/97     Systolic BP Percentile --      Diastolic BP Percentile --      Pulse Rate 06/16/23 0908 68     Resp 06/16/23 0908 (!) 27     Temp 06/16/23 0908 98.7 F (37.1 C)     Temp Source 06/16/23 0908 Oral     SpO2 06/16/23 0908 99 %     Weight 06/16/23 0904 205 lb 2.9 oz (93.1 kg)     Height 06/16/23 0904 5\' 9"  (1.753 m)     Head Circumference --      Peak Flow --      Pain Score 06/16/23 0904 0     Pain Loc --      Pain Education --      Exclude from Growth Chart --     Most recent vital signs: Vitals:   06/16/23 0908  BP: (!) 206/97  Pulse: 68  Resp: (!) 27  Temp: 98.7 F (37.1 C)  SpO2: 99%    General: Awake, no distress.  CV:  Good peripheral perfusion.  Regular rate and rhythm  Resp:  Mild tachypnea, mild expiratory wheeze bilaterally. Abd:  No distention.  Soft, nontender.  No rebound or guarding. Other:  1-2+ lower extremity edema bilaterally.   ED Results / Procedures / Treatments   EKG  EKG viewed and interpreted by myself shows a sinus rhythm at 64 bpm with a widened QRS, normal axis, prolonged  PR interval otherwise reassuring intervals with nonspecific ST changes but no ST elevation.  RADIOLOGY  I have reviewed and interpreted the chest x-ray images.  Patient appears to have interstitial edema on my evaluation. Radiology has read the x-ray is most consistent with low lung volumes with bibasilar atelectasis versus effusions.   MEDICATIONS ORDERED IN ED: Medications - No data to display   IMPRESSION / MDM / ASSESSMENT AND PLAN / ED COURSE  I reviewed the triage vital signs and the nursing notes.  Patient's presentation is most consistent with acute presentation with potential threat to life or bodily function.  Patient presents to the emergency department for shortness of breath.  Patient has a history of CHF as well as COPD, does have mild end expiratory wheezes bilaterally mild tachypnea.  EMS is does state DuoNeb and Solu-Medrol.  Patient has a history of CHF has 1-2+ lower extremity edema bilaterally and is quite hypertensive 206/97.  Given these findings we will dose nitroglycerin ointment as well as hydralazine.  We will check labs including cardiac enzymes obtain a chest x-ray  and continue to closely monitor.  Patient placed on BiPAP upon arrival.  Patient agreeable to plan of care.  Patient is doing better on BiPAP respiratory rate down to 22 blood pressure decreasing with hydralazine and nitroglycerin.  Patient's lab work shows troponin of 18, BNP elevated at 1600, chemistry shows chronic renal insufficiency largely unchanged from baseline CBC is normal.  Constellation of symptoms and findings most consistent with CHF exacerbation.  Will dose IV Lasix we will continue on BiPAP with blood pressure treatment and admit the patient to the hospital service for ongoing workup.  CRITICAL CARE Performed by: Minna Antis   Total critical care time: 30 minutes  Critical care time was exclusive of separately billable procedures and treating other patients.  Critical care was  necessary to treat or prevent imminent or life-threatening deterioration.  Critical care was time spent personally by me on the following activities: development of treatment plan with patient and/or surrogate as well as nursing, discussions with consultants, evaluation of patient's response to treatment, examination of patient, obtaining history from patient or surrogate, ordering and performing treatments and interventions, ordering and review of laboratory studies, ordering and review of radiographic studies, pulse oximetry and re-evaluation of patient's condition.   FINAL CLINICAL IMPRESSION(S) / ED DIAGNOSES   Dyspnea Hypoxia CHF exacerbation  Note:  This document was prepared using Dragon voice recognition software and may include unintentional dictation errors.   Minna Antis, MD 06/16/23 1027

## 2023-06-16 NOTE — ED Triage Notes (Signed)
Pt brought in from by EMS for SOB. Per EMS, pt satting 87-88% on RA; no improvement on [place] so EMS placed pt on CPAP. 1 duoneb tx, 1 albuterol tx, and 125mg  solumedrol administered by EMS. Upon arrival to ED, pt is A&O x 4, respirations are labored and tachy, . RT present at bedside to switch pt to BiPap.

## 2023-06-17 ENCOUNTER — Telehealth: Payer: Self-pay | Admitting: *Deleted

## 2023-06-17 ENCOUNTER — Encounter: Payer: Medicare HMO | Admitting: Family

## 2023-06-17 DIAGNOSIS — I2489 Other forms of acute ischemic heart disease: Secondary | ICD-10-CM | POA: Diagnosis present

## 2023-06-17 DIAGNOSIS — Z7984 Long term (current) use of oral hypoglycemic drugs: Secondary | ICD-10-CM | POA: Diagnosis not present

## 2023-06-17 DIAGNOSIS — I495 Sick sinus syndrome: Secondary | ICD-10-CM | POA: Diagnosis present

## 2023-06-17 DIAGNOSIS — N179 Acute kidney failure, unspecified: Secondary | ICD-10-CM | POA: Diagnosis not present

## 2023-06-17 DIAGNOSIS — J4489 Other specified chronic obstructive pulmonary disease: Secondary | ICD-10-CM | POA: Diagnosis present

## 2023-06-17 DIAGNOSIS — Z9842 Cataract extraction status, left eye: Secondary | ICD-10-CM | POA: Diagnosis not present

## 2023-06-17 DIAGNOSIS — I130 Hypertensive heart and chronic kidney disease with heart failure and stage 1 through stage 4 chronic kidney disease, or unspecified chronic kidney disease: Secondary | ICD-10-CM | POA: Diagnosis present

## 2023-06-17 DIAGNOSIS — Z85828 Personal history of other malignant neoplasm of skin: Secondary | ICD-10-CM | POA: Diagnosis not present

## 2023-06-17 DIAGNOSIS — Z8249 Family history of ischemic heart disease and other diseases of the circulatory system: Secondary | ICD-10-CM | POA: Diagnosis not present

## 2023-06-17 DIAGNOSIS — Z7901 Long term (current) use of anticoagulants: Secondary | ICD-10-CM | POA: Diagnosis not present

## 2023-06-17 DIAGNOSIS — J9601 Acute respiratory failure with hypoxia: Secondary | ICD-10-CM | POA: Diagnosis present

## 2023-06-17 DIAGNOSIS — R0600 Dyspnea, unspecified: Secondary | ICD-10-CM | POA: Diagnosis not present

## 2023-06-17 DIAGNOSIS — Z961 Presence of intraocular lens: Secondary | ICD-10-CM | POA: Diagnosis present

## 2023-06-17 DIAGNOSIS — E785 Hyperlipidemia, unspecified: Secondary | ICD-10-CM | POA: Diagnosis present

## 2023-06-17 DIAGNOSIS — Z950 Presence of cardiac pacemaker: Secondary | ICD-10-CM | POA: Diagnosis not present

## 2023-06-17 DIAGNOSIS — N1832 Chronic kidney disease, stage 3b: Secondary | ICD-10-CM | POA: Diagnosis present

## 2023-06-17 DIAGNOSIS — Z96653 Presence of artificial knee joint, bilateral: Secondary | ICD-10-CM | POA: Diagnosis present

## 2023-06-17 DIAGNOSIS — Z888 Allergy status to other drugs, medicaments and biological substances status: Secondary | ICD-10-CM | POA: Diagnosis not present

## 2023-06-17 DIAGNOSIS — Z90710 Acquired absence of both cervix and uterus: Secondary | ICD-10-CM | POA: Diagnosis not present

## 2023-06-17 DIAGNOSIS — I5033 Acute on chronic diastolic (congestive) heart failure: Secondary | ICD-10-CM | POA: Diagnosis present

## 2023-06-17 DIAGNOSIS — I161 Hypertensive emergency: Secondary | ICD-10-CM | POA: Diagnosis present

## 2023-06-17 DIAGNOSIS — E876 Hypokalemia: Secondary | ICD-10-CM | POA: Diagnosis present

## 2023-06-17 DIAGNOSIS — I480 Paroxysmal atrial fibrillation: Secondary | ICD-10-CM | POA: Diagnosis present

## 2023-06-17 DIAGNOSIS — I2510 Atherosclerotic heart disease of native coronary artery without angina pectoris: Secondary | ICD-10-CM | POA: Diagnosis present

## 2023-06-17 DIAGNOSIS — I509 Heart failure, unspecified: Secondary | ICD-10-CM | POA: Diagnosis present

## 2023-06-17 DIAGNOSIS — E1122 Type 2 diabetes mellitus with diabetic chronic kidney disease: Secondary | ICD-10-CM | POA: Diagnosis present

## 2023-06-17 LAB — BASIC METABOLIC PANEL
Anion gap: 11 (ref 5–15)
BUN: 46 mg/dL — ABNORMAL HIGH (ref 8–23)
CO2: 22 mmol/L (ref 22–32)
Calcium: 8.2 mg/dL — ABNORMAL LOW (ref 8.9–10.3)
Chloride: 102 mmol/L (ref 98–111)
Creatinine, Ser: 2.16 mg/dL — ABNORMAL HIGH (ref 0.44–1.00)
GFR, Estimated: 22 mL/min — ABNORMAL LOW (ref >60)
Glucose, Bld: 224 mg/dL — ABNORMAL HIGH (ref 70–99)
Potassium: 3.9 mmol/L (ref 3.5–5.1)
Sodium: 135 mmol/L (ref 135–145)

## 2023-06-17 LAB — TROPONIN I (HIGH SENSITIVITY): Troponin I (High Sensitivity): 28 ng/L — ABNORMAL HIGH (ref <18)

## 2023-06-17 NOTE — Progress Notes (Incomplete)
Heart Failure Stewardship Pharmacy Note  PCP: [name], MD PCP-Cardiologist: None  HPI: Leslie Parker is a 89 y.o. female with chronic HFpEF, sick sinus syndrome status post PPM, PAF on Eliquis, IIDM, CKD stage IIIb, COPD Gold stage IV who presented with shortness of breath. SBP on admission was >200 mmHg. CXR noted small bilateral pleural effusions.  Pertinent cardiac history: Echo in 05/2022 with LVEF of 60-65%, mild MR,  [employer] pacemaker implant in 10/2022 and repositioned in 11/2022. Echo in 01/2023 showed LVEF 60-65% with grade I diastolic dysfunction.  Pertinent Lab Values: Creatinine  Date Value Ref Range Status  07/13/2014 1.26 0.60 - 1.30 mg/dL Final   Creatinine, Ser  Date Value Ref Range Status  06/17/2023 2.16 (H) 0.44 - 1.00 mg/dL Final   BUN  Date Value Ref Range Status  06/17/2023 46 (H) 8 - 23 mg/dL Final  16/04/9603 18 7 - 18 mg/dL Final   Potassium  Date Value Ref Range Status  06/17/2023 3.9 3.5 - 5.1 mmol/L Final  07/13/2014 3.3 (L) 3.5 - 5.1 mmol/L Final   Sodium  Date Value Ref Range Status  06/17/2023 135 135 - 145 mmol/L Final  07/13/2014 141 136 - 145 mmol/L Final   B Natriuretic Peptide  Date Value Ref Range Status  06/16/2023 1,602.3 (H) 0.0 - 100.0 pg/mL Final    Comment:    Performed at [facility], [address] [place], [place] [address]   Magnesium  Date Value Ref Range Status  05/02/2023 1.9 1.7 - 2.4 mg/dL Final    Comment:    Performed at [facility], [address] [place], [place] [address]   Hemoglobin A1C  Date Value Ref Range Status  01/20/2014 7.5 (H) 4.2 - 6.3 % Final    Comment:    The American Diabetes Association recommends that a primary goal of therapy should be <7% and that physicians should reevaluate the treatment regimen in patients with HbA1c values consistently >8%.    Hgb A1c MFr Bld  Date Value Ref Range Status  03/21/2023 5.8 (H) 4.8 - 5.6 % Final     Comment:    (NOTE) Pre diabetes:          5.7%-6.4%  Diabetes:              >6.4%  Glycemic control for   <7.0% adults with diabetes    TSH  Date Value Ref Range Status  06/05/2022 1.148 0.350 - 4.500 uIU/mL Final    Comment:    Performed by a 3rd Generation assay with a functional sensitivity of <=0.01 uIU/mL. Performed at [facility], [address] [place], [place] [address]     Vital Signs: Admission weight: Temp:  [97.8 F (36.6 C)-98 F (36.7 C)] 97.8 F (36.6 C) (11/24 2150) Pulse Rate:  [60-100] 74 (11/25 0902) Resp:  [12-24] 20 (11/25 0902) BP: (131-192)/(70-126) 183/81 (11/25 0902) SpO2:  [93 %-100 %] 97 % (11/25 0902)  Intake/Output Summary (Last 24 hours) at 06/17/2023 6213 Last data filed at 06/17/2023 0040 Gross per 24 hour  Intake --  Output 601 ml  Net -601 ml    Current Heart Failure Medications:  Loop diuretic: Beta-Blocker: ACEI/ARB/ARNI: MRA: SGLT2i: Other:  Prior to admission Heart Failure Medications:  Loop diuretic: furosemide 20 mg daily Beta-Blocker: ACEI/ARB/ARNI: MRA: SGLT2i: Other vasoactive medications include diltiazem 360 mg daily, hydralazine 25 mg TID, and metoprolol tartrate 50 mg BID  Assessment: 1. Acute on chronic diastolic heart failure (LVEF  60-65%) with grade I diastolic dysfunction, due to presumed NICM. NYHA class *** symptoms.  -Symptoms: -Volume: -Hemodynamics: -SGLT2i: -MRA: -ARNI:  -  Plan: 1) Medication changes recommended at this time:  2) Patient assistance:   3) Education: -To be completed prior to discharge.  *** Medication Assistance / Insurance Benefits Check: Does the patient have prescription insurance?    Type of insurance plan:  Does the patient qualify for medication assistance through manufacturers or grants? {CHL AMB Yes/No/Pending:[id number]}  Eligible grants and/or patient assistance programs: ***  Medication assistance applications in progress: ***  Medication  assistance applications approved: *** Approved medication assistance renewals will be completed by: ***  Outpatient Pharmacy: Prior to admission outpatient pharmacy: ***      ***

## 2023-06-17 NOTE — Evaluation (Signed)
Occupational Therapy Evaluation Patient Details Name: Leslie Parker MRN: [id number] DOB: [birth year] Today's Date: 06/17/2023   History of Present Illness Leslie Parker is a 89 y.o. female with medical history significant of chronic HFpEF, sick sinus syndrome status post PPM, PAF on Eliquis, IIDM, CKD stage IIIb, COPD Gold stage IV, presented with worsening of shortness of breath.     Symptoms started last night patient developed shortness of breath and could not lie flat on the bed denied any cough no chest pain no fever or chills.  Patient reported that she has been compliant with her CHF and BP medications and at baseline her blood pressure runs SBP in the 130s.  This morning, patient was found to be hypoxic 87% on room air and EMS was called.  EMS arrived who gave patient 1 DuoNeb and 1 albuterol and Solu-Medrol 125 mg and put patient on CPAP.   Clinical Impression   Patient received for OT evaluation. See flowsheet below for details of function. Generally, patient requiring supervision for supine to sit and MIN A sit to supine for bed mobility, supervision with RW for functional mobility, and supervision/set up for ADLs. Patient will benefit from continued OT while in acute care.        If plan is discharge home, recommend the following: A little help with walking and/or transfers;A little help with bathing/dressing/bathroom;Assist for transportation;Assistance with cooking/housework    Functional Status Assessment  Patient has had a recent decline in their functional status and demonstrates the ability to make significant improvements in function in a reasonable and predictable amount of time.  Equipment Recommendations       Recommendations for Other Services       Precautions / Restrictions Precautions Precautions: Fall Precaution Comments: watch O2 Restrictions Weight Bearing Restrictions: No      Mobility Bed Mobility Overal bed mobility: Needs Assistance Bed Mobility:  Supine to Sit, Sit to Supine     Supine to sit: Modified independent (Device/Increase time) Sit to supine: Min assist (assist for BIL LE)        Transfers Overall transfer level: Needs assistance Equipment used: Rolling walker (2 wheels) Transfers: Sit to/from Stand Sit to Stand: Supervision (needed OT cues; first attempt pt tried to pull up from RW and was unsuccessful; when pushing up from bed pt successful.)                  Balance Overall balance assessment: Mild deficits observed, not formally tested                                         ADL either performed or assessed with clinical judgement   ADL Overall ADL's : Needs assistance/impaired                                     Functional mobility during ADLs: Supervision/safety;Rolling walker (2 wheels) General ADL Comments: Pt able to doemonstrate how she leans forward for donning socks/shoes (does so at home from lift recliner with LE elevated); able to walk next to bed forwards, backwards, and sidestepping (limited by O2 tubing at this time); no drop in O2 during mobility (remained on 2L with O2 94-97% throughout session). Able to comb hair at EOB. Denies need for [facility], although anticipate she would be supervision with transfer  using RW. Appears close to functional baseline, although not on home O2 at baseline. Continue to work with OT on activity tolerance.     Vision Patient Visual Report: No change from baseline       Perception         Praxis         Pertinent Vitals/Pain Pain Assessment Pain Assessment: No/denies pain     Extremity/Trunk Assessment Upper Extremity Assessment Upper Extremity Assessment: Overall WFL for tasks assessed (limited to approx 90 degrees shoulder flexion 2/2 old shoulder surgeries.)   Lower Extremity Assessment Lower Extremity Assessment: Defer to PT evaluation;Overall WFL for tasks assessed       Communication  Communication Communication: No apparent difficulties   Cognition Arousal: Alert Behavior During Therapy: WFL for tasks assessed/performed Overall Cognitive Status: Within Functional Limits for tasks assessed                                 General Comments: Pt is A+Ox4, pleasant, willing to work with OT.     General Comments  On 2L O2 throughout session.    Exercises     Shoulder Instructions      Home Living Family/patient expects to be discharged to:: Private residence Living Arrangements: Alone Available Help at Discharge: Family Type of Home: Apartment Home Access: Level entry     Home Layout: One level     Bathroom Shower/Tub: Tub/shower unit;Sponge bathes at baseline   [employer]: Handicapped height Bathroom Accessibility: Yes How Accessible: Accessible via walker Home Equipment: Rollator (4 wheels);Cane - single point;Grab bars - tub/shower;Grab bars - toilet;Lift chair;Transport chair;Tub bench;Rolling Walker (2 wheels);Shower seat   Additional Comments: Senior living apartment; Pt reports having HH services and a RN that comes by 3x a week      Prior Functioning/Environment Prior Level of Function : Needs assist;History of Falls (last six months)             Mobility Comments: Pt reports only recent fall was in July (hip fx and surgery). Uses rollator at baseline. Family assists with pt leaving the home; she uses transport w/c when out of the home recently. ADLs Comments: MOD (I) ADLs; afraid to get into her shower (it's a tub; reports that it's supposed to be replaced with a walk-in soon), so she sponge bathes. Hx of BIL shoulder replacements so UE limited to approx 90 degrees shoulder flexion. Family assists with trash and mail and groceries, as pt hasn't driven since July surgery. Pt does own meals/medications. Pt enjoys painting, watching sports on TV, word searches.        OT Problem List: Decreased range of motion;Decreased  activity tolerance;Impaired balance (sitting and/or standing)      OT Treatment/Interventions: Self-care/ADL training;Therapeutic exercise;Energy conservation;Therapeutic activities;DME and/or AE instruction    OT Goals(Current goals can be found in the care plan section) Acute Rehab OT Goals Patient Stated Goal: Get better; go home OT Goal Formulation: With patient Time For Goal Achievement: 07/01/23 Potential to Achieve Goals: Good ADL Goals Pt Will Perform Grooming: with modified independence;with adaptive equipment Pt Will Perform Lower Body Dressing: with modified independence;sit to/from stand Pt Will Perform Toileting - Clothing Manipulation and hygiene: with modified independence;sit to/from stand  OT Frequency: Min 1X/week    Co-evaluation              AM-PAC OT "6 Clicks" Daily Activity     Outcome Measure Help from  another person eating meals?: None Help from another person taking care of personal grooming?: None Help from another person toileting, which includes using toliet, bedpan, or urinal?: None Help from another person bathing (including washing, rinsing, drying)?: A Little Help from another person to put on and taking off regular upper body clothing?: None Help from another person to put on and taking off regular lower body clothing?: None 6 Click Score: 23   End of Session Equipment Utilized During Treatment: Rolling walker (2 wheels) Nurse Communication: Mobility status  Activity Tolerance: Patient tolerated treatment well Patient left: in bed;with call bell/phone within reach;with bed alarm set  OT Visit Diagnosis: Unsteadiness on feet (R26.81)                Time: 6962-9528 OT Time Calculation (min): 23 min Charges:  OT General Charges $OT Visit: 1 Visit OT Evaluation $OT Eval Moderate Complexity: 1 Mod OT Treatments $Self Care/Home Management : 8-22 mins  Leslie Foster, MS, OTR/L  Leslie Parker 06/17/2023, 9:18 AM

## 2023-06-17 NOTE — ED Notes (Signed)
Assisted on and off bedpan, NADN

## 2023-06-17 NOTE — Progress Notes (Signed)
PROGRESS NOTE  Leslie Parker WUJ:[id number] DOB: 23-Dec-1932 DOA: 06/16/2023 PCP: [name], MD  HPI/Recap of past 24 hours: Leslie Parker is a 89 y.o. female with medical history significant of chronic HFpEF, sick sinus syndrome status post PPM, PAF on Eliquis, IIDM, CKD stage IIIb, COPD Gold stage IV, presented with worsening of shortness of breath.  Workup revealed new hypoxia and elevated BNP with concern for acute on chronic dCHF exacerbation.  06/17/23: Seen and examined at bedside.  Reports dyspnea with ambulation.  Left JVD and bilateral lower extremity edema noted on exam.  Denies any anginal symptoms.   Assessment/Plan: Principal Problem:   CHF (congestive heart failure) (HCC) Active Problems:   Acute on chronic diastolic CHF (congestive heart failure) (HCC)   SOB (shortness of breath)   Stage 3b chronic kidney disease (HCC)  Acute on chronic HFpEF decompensation -Likely secondary to HTN emergency-presented with SBP 206 with elevated troponin, 28. -Change metoprolol to Coreg.  Increase hydralazine to 50 mg 3 times daily and add Imdur.   -as needed hydralazine -Continue Lasix 60 mg IV twice daily and monitor kidney function.   Acute hypoxic respiratory failure, secondary to the above with bilateral pleural effusions, mild pulmonary edema. -management as above Renal function supplementation as tolerated. Currently requiring 2 L to maintain saturation above 92%   HTN emergency -As above. Continue antihypertensives as listed above Continue IV diuresing. Continue to closely monitor vital signs   PAF, on Eliquis -In sinus rhythm, continue Eliquis -Amiodarone   IIDM -Continue glipizide   AKI on CKD stage IIIb -Fluid overload, diuresis as above. -Baseline creatinine appears to be 1.5 with GFR of 32 -Currently creatinine 2.16 with GFR 22. Continue to avoid nephrotoxic agents, dehydration and hypotension Monitor urine output.   SSS s/p pacemaker  placement -No acute concern   Deconditioning -PT OT evaluation Continue fall precautions    Time: 55 minutes    DVT prophylaxis: Eliquis   Code Status: Full code Family Communication: None at bedside  Consults called: None Admission status: Telemetry unit   Status is: Inpatient The patient requires at least 2 midnights for further evaluation and treatment of present condition.    Objective: Vitals:   06/17/23 0902 06/17/23 0928 06/17/23 1031 06/17/23 1100  BP: (!) 183/81  (!) 178/86 (!) 167/78  Pulse: 74  68 65  Resp: 20   18  Temp:  97.9 F (36.6 C)    TempSrc:  Oral    SpO2: 97%   97%  Weight:      Height:        Intake/Output Summary (Last 24 hours) at 06/17/2023 1301 Last data filed at 06/17/2023 1033 Gross per 24 hour  Intake 3 ml  Output 601 ml  Net -598 ml   Filed Weights   06/16/23 0904  Weight: 93.1 kg    Exam:  General: 89 y.o. year-old female well developed well nourished in no acute distress.  Alert and oriented x3. Cardiovascular: Regular rate and rhythm with no rubs or gallops.  No thyromegaly.  Left JVD noted. Respiratory: Mild rales at bases. Good inspiratory effort. Abdomen: Soft nontender nondistended with normal bowel sounds x4 quadrants. Musculoskeletal: 1+ pitting edema lower extremities bilaterally. Skin: No ulcerative lesions noted or rashes, Psychiatry: Mood is appropriate for condition and setting   Data Reviewed: CBC: Recent Labs  Lab 06/16/23 0912  WBC 6.7  HGB 13.2  HCT 40.5  MCV 87.1  PLT 288   Basic Metabolic Panel: Recent Labs  Lab 06/16/23 0912 06/17/23 0621  NA 135 135  K 4.0 3.9  CL 102 102  CO2 21* 22  GLUCOSE 144* 224*  BUN 41* 46*  CREATININE 2.01* 2.16*  CALCIUM 8.7* 8.2*   GFR: Estimated Creatinine Clearance: 21.9 mL/min (A) (by C-G formula based on SCr of 2.16 mg/dL (H)). Liver Function Tests: Recent Labs  Lab 06/16/23 0912  AST 47*  ALT 36  ALKPHOS 114  BILITOT 0.6  PROT 6.9   ALBUMIN 3.4*   No results for input(s): "LIPASE", "AMYLASE" in the last 168 hours. No results for input(s): "AMMONIA" in the last 168 hours. Coagulation Profile: No results for input(s): "INR", "PROTIME" in the last 168 hours. Cardiac Enzymes: No results for input(s): "CKTOTAL", "CKMB", "CKMBINDEX", "TROPONINI" in the last 168 hours. BNP (last 3 results) No results for input(s): "PROBNP" in the last 8760 hours. HbA1C: No results for input(s): "HGBA1C" in the last 72 hours. CBG: No results for input(s): "GLUCAP" in the last 168 hours. Lipid Profile: No results for input(s): "CHOL", "HDL", "LDLCALC", "TRIG", "CHOLHDL", "LDLDIRECT" in the last 72 hours. Thyroid Function Tests: No results for input(s): "TSH", "T4TOTAL", "FREET4", "T3FREE", "THYROIDAB" in the last 72 hours. Anemia Panel: No results for input(s): "VITAMINB12", "FOLATE", "FERRITIN", "TIBC", "IRON", "RETICCTPCT" in the last 72 hours. Urine analysis:    Component Value Date/Time   COLORURINE AMBER (A) 11/24/2022 2239   APPEARANCEUR CLOUDY (A) 11/24/2022 2239   APPEARANCEUR Clear 02/19/2013 0254   LABSPEC 1.016 11/24/2022 2239   LABSPEC 1.006 02/19/2013 0254   PHURINE 5.0 11/24/2022 2239   GLUCOSEU NEGATIVE 11/24/2022 2239   GLUCOSEU Negative 02/19/2013 0254   HGBUR NEGATIVE 11/24/2022 2239   BILIRUBINUR NEGATIVE 11/24/2022 2239   BILIRUBINUR Negative 02/19/2013 0254   KETONESUR 5 (A) 11/24/2022 2239   PROTEINUR 100 (A) 11/24/2022 2239   NITRITE NEGATIVE 11/24/2022 2239   LEUKOCYTESUR MODERATE (A) 11/24/2022 2239   LEUKOCYTESUR 1+ 02/19/2013 0254   Sepsis Labs: @LABRCNTIP (procalcitonin:4,lacticidven:4)  )No results found for this or any previous visit (from the past 240 hour(s)).    Studies: No results found.  Scheduled Meds:  amiodarone  200 mg Oral Daily   apixaban  2.5 mg Oral BID   ascorbic acid  500 mg Oral Daily   carvedilol  3.125 mg Oral BID WC   cholecalciferol  1,000 Units Oral Daily    colestipol  1 g Oral BID   furosemide  60 mg Intravenous BID   glipiZIDE  2.5 mg Oral Q breakfast   hydrALAZINE  50 mg Oral Q8H   isosorbide mononitrate  30 mg Oral Daily   magnesium oxide  400 mg Oral Daily   multivitamin with minerals  1 tablet Oral Daily   pantoprazole  40 mg Oral Daily   sodium chloride flush  3 mL Intravenous Q12H   traZODone  100 mg Oral QHS    Continuous Infusions:   LOS: 0 days     Darlin Drop, MD [facility] Pager [phone]  If 7PM-7AM, please contact night-coverage [email or web address] Password [email or web address] 06/17/2023, 1:01 PM

## 2023-06-17 NOTE — Evaluation (Signed)
Physical Therapy Evaluation Patient Details Name: Leslie Parker MRN: [id number] DOB: 1933/07/16 Today's Date: 06/17/2023  History of Present Illness  Leslie Parker is a 89 y.o. female with medical history significant of chronic HFpEF, sick sinus syndrome status post PPM, PAF on Eliquis, IIDM, CKD stage IIIb, COPD Gold stage IV, presented with worsening of shortness of breath.     Symptoms started last night patient developed shortness of breath and could not lie flat on the bed denied any cough no chest pain no fever or chills.  Patient reported that she has been compliant with her CHF and BP medications and at baseline her blood pressure runs SBP in the 130s.  This morning, patient was found to be hypoxic 87% on room air and EMS was called.  EMS arrived who gave patient 1 DuoNeb and 1 albuterol and Solu-Medrol 125 mg and put patient on CPAP.   Clinical Impression  Pt received in bed on 2L of O2 [place] and agreed to PT session. Pt denied pain, however expressed sensing weakness in right hip from procedure end of July. Pt performed supine-sit SUP, STS with the use of RW (2wheels) CGA, amb in hallway with RW SUP-CGA, and sit>supine MinA for RLE management. Pt performed all activity on RA and maintained SpO2 of 90-96% throughout session. Pt reported that amb felt good and she did not experience any SOB, just a sense of heavy breathing. VC necessary throughout session for instructed pursed lip breathing. Pt ended session in bed on RA with an SpO2 reading of 90% which pt reported has been her baseline prior to admission. Pt tolerated Tx well and will continue to benefit from skilled PT sessions to improve endurance, functional mobility, and activity tolerance to maximize safety/return to PLOF following D/C.        If plan is discharge home, recommend the following: A little help with walking and/or transfers;Assist for transportation;Help with stairs or ramp for entrance   Can travel by private vehicle    Yes    Equipment Recommendations None recommended by PT  Recommendations for Other Services       Functional Status Assessment Patient has had a recent decline in their functional status and demonstrates the ability to make significant improvements in function in a reasonable and predictable amount of time.     Precautions / Restrictions Precautions Precautions: Fall Precaution Comments: watch O2 Restrictions Weight Bearing Restrictions: No      Mobility  Bed Mobility Overal bed mobility: Needs Assistance Bed Mobility: Supine to Sit, Sit to Supine     Supine to sit: Modified independent (Device/Increase time) Sit to supine: Min assist   General bed mobility comments: Pt performed bed mobility SUP-MinA. Sit>supine MinA for RLE management    Transfers Overall transfer level: Needs assistance Equipment used: Rolling walker (2 wheels) Transfers: Sit to/from Stand Sit to Stand: Supervision           General transfer comment: Pt performed STS with the use of RW (2wheels) SUP and did not report any s/sx relative to dizziness while standing.    Ambulation/Gait Ambulation/Gait assistance: Supervision, Contact guard assist Gait Distance (Feet): 50 Feet Assistive device: Rolling walker (2 wheels) Gait Pattern/deviations: Step-through pattern Gait velocity: Decreased     General Gait Details: Pt amb in hallway with RW (2wheels) SUP-CGA. Pt reported that they did not experience SOB while amb on RA.  Stairs            Psychologist, prison and probation services  Tilt Bed    Modified Rankin (Stroke Patients Only)       Balance Overall balance assessment: Needs assistance Sitting-balance support: Feet supported Sitting balance-[name]: Good     Standing balance support: Bilateral upper extremity supported, Reliant on assistive device for balance, During functional activity Standing balance-[name]: Good                               Pertinent  Vitals/Pain Pain Assessment Pain Assessment: No/denies pain    Home Living Family/patient expects to be discharged to:: Private residence Living Arrangements: Alone Available Help at Discharge: Family Type of Home: Apartment Home Access: Level entry       Home Layout: One level Home Equipment: Rollator (4 wheels);Cane - single point;Grab bars - tub/shower;Grab bars - toilet;Lift chair;Transport chair;Tub bench;Rolling Walker (2 wheels);Shower seat Additional Comments: Senior living apartment; Pt reports having HH services and a RN that comes by 3x a week    Prior Function Prior Level of Function : Needs assist;History of Falls (last six months)             Mobility Comments: Pt reports only recent fall was in July (hip fx and surgery). Uses rollator at baseline. Family assists with pt leaving the home; she uses transport w/c when out of the home recently. ADLs Comments: MOD (I) ADLs; afraid to get into her shower (it's a tub; reports that it's supposed to be replaced with a walk-in soon), so she sponge bathes. Hx of BIL shoulder replacements so UE limited to approx 90 degrees shoulder flexion. Family assists with trash and mail and groceries, as pt hasn't driven since July surgery. Pt does own meals/medications.     Extremity/Trunk Assessment   Upper Extremity Assessment Upper Extremity Assessment: Overall WFL for tasks assessed (Limited to approx 90 degrees shoulder flexion due to old shoulder injuries)    Lower Extremity Assessment Lower Extremity Assessment: Overall WFL for tasks assessed       Communication   Communication Communication: No apparent difficulties  Cognition Arousal: Alert Behavior During Therapy: WFL for tasks assessed/performed Overall Cognitive Status: Within Functional Limits for tasks assessed                                 General Comments: AOx4. Pt pleasant and willing to participate in PT session.        General Comments  General comments (skin integrity, edema, etc.): On 2L O2 throughout session.    Exercises     Assessment/Plan    PT Assessment Patient needs continued PT services  PT Problem List Decreased activity tolerance;Decreased mobility       PT Treatment Interventions DME instruction;Gait training;Functional mobility training    PT Goals (Current goals can be found in the Care Plan section)  Acute Rehab PT Goals Patient Stated Goal: to go home PT Goal Formulation: With patient Time For Goal Achievement: 07/01/23 Potential to Achieve Goals: Good    Frequency Min 1X/week     Co-evaluation               AM-PAC PT "6 Clicks" Mobility  Outcome Measure Help needed turning from your back to your side while in a flat bed without using bedrails?: None Help needed moving from lying on your back to sitting on the side of a flat bed without using bedrails?: A Little Help needed moving  to and from a bed to a chair (including a wheelchair)?: A Little Help needed standing up from a chair using your arms (e.g., wheelchair or bedside chair)?: A Little Help needed to walk in hospital room?: A Little Help needed climbing 3-5 steps with a railing? : A Little 6 Click Score: 19    End of Session Equipment Utilized During Treatment: Gait belt Activity Tolerance: Patient tolerated treatment well Patient left: in bed;with call bell/phone within reach;with bed alarm set Nurse Communication: Mobility status PT Visit Diagnosis: Muscle weakness (generalized) (M62.81);History of falling (Z91.81);Difficulty in walking, not elsewhere classified (R26.2)    Time: 1601-0932 PT Time Calculation (min) (ACUTE ONLY): 26 min   Charges:   PT Evaluation $PT Eval Low Complexity: 1 Low PT Treatments $Gait Training: 8-22 mins PT General Charges $$ ACUTE PT VISIT: 1 Visit         Kirk Basquez Sauvignon Howard SPT, LAT, ATC  Vonna Brabson Sauvignon-Howard 06/17/2023, 1:06 PM

## 2023-06-17 NOTE — ED Notes (Signed)
Pt sleeping at this time. Chest rise and fall noted.

## 2023-06-17 NOTE — ED Notes (Signed)
Pt taken off bed.

## 2023-06-17 NOTE — Patient Outreach (Signed)
  Care Coordination   Follow Up Visit Note   06/17/2023 Name: HENSLEY KASCHAK MRN: [id number] DOB: 02/15/1933  ANNABELL GENE is a 89 y.o. year old female who sees [name], MD for primary care.  Noted that patient presented back to the ED yesterday, admitted today to hospital.  Hospital liaison notified of readmission.    Goals Addressed             This Visit's Progress    Management of chronic health conditions   Not on track    Interventions Today    Flowsheet Row Most Recent Value  Chronic Disease   Chronic disease during today's visit Congestive Heart Failure (CHF)  General Interventions   General Interventions Discussed/Reviewed General Interventions Reviewed, Communication with  [readmitted to hospital]  Communication with RN  [hospital liaison notified of readmission]              SDOH assessments and interventions completed:  No     Care Coordination Interventions:  Yes, provided   Follow up plan:  pending hospital disposition     Encounter Outcome:  Patient Visit Completed   Rodney Langton, RN, MSN, CCM [place]  [facility], [facility] RN Care Coordinator Direct Dial: [phone] / Main [phone] Fax [phone] Email: [email or web address]

## 2023-06-18 ENCOUNTER — Encounter: Payer: Medicare HMO | Admitting: *Deleted

## 2023-06-18 DIAGNOSIS — I5033 Acute on chronic diastolic (congestive) heart failure: Secondary | ICD-10-CM | POA: Diagnosis not present

## 2023-06-18 LAB — BASIC METABOLIC PANEL
Anion gap: 7 (ref 5–15)
BUN: 60 mg/dL — ABNORMAL HIGH (ref 8–23)
CO2: 26 mmol/L (ref 22–32)
Calcium: 8 mg/dL — ABNORMAL LOW (ref 8.9–10.3)
Chloride: 102 mmol/L (ref 98–111)
Creatinine, Ser: 2.2 mg/dL — ABNORMAL HIGH (ref 0.44–1.00)
GFR, Estimated: 21 mL/min — ABNORMAL LOW (ref >60)
Glucose, Bld: 144 mg/dL — ABNORMAL HIGH (ref 70–99)
Potassium: 3.5 mmol/L (ref 3.5–5.1)
Sodium: 135 mmol/L (ref 135–145)

## 2023-06-18 LAB — TROPONIN I (HIGH SENSITIVITY): Troponin I (High Sensitivity): 22 ng/L — ABNORMAL HIGH (ref <18)

## 2023-06-18 NOTE — Progress Notes (Signed)
Physical Therapy Treatment Patient Details Name: Leslie Parker MRN: [id number] DOB: [birth year] Today's Date: 06/18/2023   History of Present Illness Leslie Parker is a 89 y.o. female with medical history significant of chronic HFpEF, sick sinus syndrome status post PPM, PAF on Eliquis, IIDM, CKD stage IIIb, COPD Gold stage IV, presented with worsening of shortness of breath.     Symptoms started last night patient developed shortness of breath and could not lie flat on the bed denied any cough no chest pain no fever or chills.  Patient reported that she has been compliant with her CHF and BP medications and at baseline her blood pressure runs SBP in the 130s.  This morning, patient was found to be hypoxic 87% on room air and EMS was called.  EMS arrived who gave patient 1 DuoNeb and 1 albuterol and Solu-Medrol 125 mg and put patient on CPAP.    PT Comments  Pt received in bed on 2L of O2 [place] and agreed to PT session. Pt denied pain, however expressed sensing continued weakness in right hip from THR procedure end of July. Pt performed supine-sit ModI, STS with the use of RW (2wheels) SUP, amb in hallway with RW SUP-CGA, and sit>supine MinA for RLE management. Pt performed all activity on RA and maintained SpO2 of 91-96% throughout session. VC necessary throughout session for instructed pursed lip breathing. Pt ended session in bed on RA with an SpO2 reading of 93%. Pt tolerated Tx well and will continue to benefit from skilled PT sessions to improve endurance, functional mobility, and activity tolerance to maximize safety/return to PLOF following D/C.    If plan is discharge home, recommend the following: A little help with walking and/or transfers;Assist for transportation;Help with stairs or ramp for entrance   Can travel by private vehicle     Yes  Equipment Recommendations  None recommended by PT    Recommendations for Other Services       Precautions / Restrictions Precautions Precautions:  Fall Precaution Comments: watch O2 Restrictions Weight Bearing Restrictions: No     Mobility  Bed Mobility Overal bed mobility: Needs Assistance Bed Mobility: Supine to Sit, Sit to Supine     Supine to sit: Modified independent (Device/Increase time) Sit to supine: Min assist   General bed mobility comments: Pt performed bed mobility ModI-MinA. Sit>supine MinA for RLE management    Transfers Overall transfer level: Needs assistance Equipment used: Rolling walker (2 wheels) Transfers: Sit to/from Stand Sit to Stand: Supervision           General transfer comment: Pt performed STS with the use of RW (2wheels) SUP and did not report any s/sx relative to dizziness while standing.    Ambulation/Gait Ambulation/Gait assistance: Supervision, Contact guard assist Gait Distance (Feet): 130 Feet Assistive device: Rolling walker (2 wheels) Gait Pattern/deviations: Step-through pattern Gait velocity: Decreased     General Gait Details: Pt amb in hallway with RW (2wheels) SUP-CGA. VC necessary throughout amb for instructed pursed lip breathing   Stairs             Wheelchair Mobility     Tilt Bed    Modified Rankin (Stroke Patients Only)       Balance Overall balance assessment: Needs assistance Sitting-balance support: Feet supported Sitting balance-[name]: Good     Standing balance support: Bilateral upper extremity supported, Reliant on assistive device for balance, During functional activity Standing balance-[name]: Good  Cognition Arousal: Alert Behavior During Therapy: WFL for tasks assessed/performed Overall Cognitive Status: Within Functional Limits for tasks assessed                                 General Comments: AOx4. Pt pleasant and willing to participate in PT session.        Exercises      General Comments        Pertinent Vitals/Pain Pain Assessment Pain  Assessment: No/denies pain    Home Living                          Prior Function            PT Goals (current goals can now be found in the care plan section) Acute Rehab PT Goals Patient Stated Goal: to go home PT Goal Formulation: With patient Time For Goal Achievement: 07/01/23 Potential to Achieve Goals: Good Progress towards PT goals: Progressing toward goals    Frequency    Min 1X/week      PT Plan      Co-evaluation              AM-PAC PT "6 Clicks" Mobility   Outcome Measure  Help needed turning from your back to your side while in a flat bed without using bedrails?: None Help needed moving from lying on your back to sitting on the side of a flat bed without using bedrails?: A Little Help needed moving to and from a bed to a chair (including a wheelchair)?: A Little Help needed standing up from a chair using your arms (e.g., wheelchair or bedside chair)?: A Little Help needed to walk in hospital room?: A Little Help needed climbing 3-5 steps with a railing? : A Little 6 Click Score: 19    End of Session Equipment Utilized During Treatment: Gait belt Activity Tolerance: Patient tolerated treatment well;Patient limited by fatigue Patient left: in bed;with call bell/phone within reach;with bed alarm set Nurse Communication: Mobility status PT Visit Diagnosis: Muscle weakness (generalized) (M62.81);History of falling (Z91.81);Difficulty in walking, not elsewhere classified (R26.2)     Time: 4782-9562 PT Time Calculation (min) (ACUTE ONLY): 15 min  Charges:    $Gait Training: 8-22 mins PT General Charges $$ ACUTE PT VISIT: 1 Visit                    Mykaela Arena Sauvignon Howard SPT, LAT, ATC  Gunnison Chahal Sauvignon-Howard 06/18/2023, 12:53 PM

## 2023-06-18 NOTE — Plan of Care (Signed)
Progressing towards goals

## 2023-06-18 NOTE — TOC Progression Note (Signed)
Transition of Care [facility]) - Progression Note    Patient Details  Name: Leslie Parker MRN: [id number] Date of Birth: [birth year]  [facility] CM/SW Contact  Truddie Hidden, RN Phone Number: 06/18/2023, 3:50 PM  Clinical Narrative:    Spoke with patient at bedside. Patient is agreeable to [facility] and is active with [facility] for RN/PT. Her grandaughter will transport her home at discharge.          Expected Discharge Plan and Services                                               Social Determinants of Health (SDOH) Interventions SDOH Screenings   Food Insecurity: No Food Insecurity (06/17/2023)  Housing: Low Risk  (06/17/2023)  Transportation Needs: No Transportation Needs (06/17/2023)  Utilities: Not At Risk (06/17/2023)  Financial Resource Strain: Low Risk  (06/06/2023)  Tobacco Use: Low Risk  (06/16/2023)    Readmission Risk Interventions    03/21/2023    1:12 PM 08/27/2022   12:05 PM  Readmission Risk Prevention Plan  Transportation Screening Complete Complete  PCP or Specialist Appt within 3-5 Days Complete Complete  HRI or Home Care Consult Complete Complete  Social Work Consult for Recovery Care Planning/Counseling Complete   Palliative Care Screening Not Applicable   Medication Review [employer]) Complete Complete

## 2023-06-18 NOTE — Progress Notes (Signed)
Education Assessment and Provision:  Detailed education reviewed and instructions provided on heart failure disease management including the following:  Signs and symptoms of Heart Failure When to call the physician Importance of daily weights Low sodium diet Fluid restriction Medication management Anticipated future follow-up appointments  Patient education given on each of the above topics.  Patient acknowledges understanding via teach back method and acceptance of all instructions.  Education Materials:  "Living Better With Heart Failure" Booklet, HF zone tool, & Daily Weight Tracker Tool.  Patient has scale at home: Yes Patient has pill box at home: Yes  Pt was readmitted to the hospital so she missed her previous scheduled [facility] with [facility].  All HF education was reviewed and appointment rescheduled for 06/27/23 @ 11:00 AM.  Pt aware of new appointment scheduled.  [name], RN, BSN [facility] Heart Failure Navigator Secure Chat Only

## 2023-06-18 NOTE — Progress Notes (Addendum)
PROGRESS NOTE  Leslie Parker:[id number] DOB: Dec 23, 1932 DOA: 06/16/2023 PCP: [name], MD  HPI/Recap of past 24 hours: Leslie Parker is a 89 y.o. female with medical history significant of chronic HFpEF, sick sinus syndrome status post PPM, PAF on Eliquis, IIDM, CKD stage IIIb, COPD Gold stage IV, presented with worsening of shortness of breath.  Workup revealed new hypoxia and elevated BNP with concern for acute on chronic dCHF exacerbation.  Ongoing diuresing with IV Lasix 60 mg twice daily.  06/18/23: The patient was seen and examined at bedside.  She has no new complaints.  States her breathing is improved.  Still hypertensive and volume overload.  Ongoing diuresing.  She denies having any anginal symptoms.   Assessment/Plan: Principal Problem:   CHF (congestive heart failure) (HCC) Active Problems:   Acute on chronic diastolic CHF (congestive heart failure) (HCC)   SOB (shortness of breath)   Stage 3b chronic kidney disease (HCC)  Acute on chronic HFpEF decompensation -Likely secondary to HTN emergency-presented with SBP 206 with elevated troponin, 28. -Change metoprolol to Coreg.  Increase hydralazine to 50 mg 3 times daily and add Imdur.   -as needed hydralazine Continue Lasix 60 mg IV twice daily, replace electrolytes, and monitor kidney function. Net I&O negative 498 cc   Acute hypoxic respiratory failure, secondary to the above with bilateral pleural effusions, mild pulmonary edema. -management as above Wean off oxygen supplementation as tolerated. Currently requiring 2 L to maintain O2 saturation above 92%  Elevated troponin, suspect demand ischemia in the setting of uncontrolled hypertension and acute hypoxia Denies any anginal symptoms Troponin 28 Will repeat troponin to reach a peak No evidence of acute ischemia on twelve-lead EKG. Continue to monitor on telemetry.   HTN emergency BP is not at goal, elevated -As above. Continue antihypertensives  as listed above Continue IV diuresing. Continue to closely monitor vital signs   PAF, on Eliquis -In sinus rhythm, continue Eliquis -Amiodarone   IIDM -Continue glipizide   AKI on CKD stage IIIb -Fluid overload, diuresis as above. -Baseline creatinine appears to be 1.5 with GFR of 32 -Currently creatinine 2.16 with GFR 22. Continue to avoid nephrotoxic agents Continue to monitor urine output.   SSS s/p pacemaker placement -No acute concern   Deconditioning -PT OT evaluation Continue fall precautions    Time: 55 minutes    DVT prophylaxis: Eliquis   Code Status: Full code Family Communication: None at bedside  Consults called: None Admission status: Telemetry unit   Status is: Inpatient The patient requires at least 2 midnights for further evaluation and treatment of present condition.    Objective: Vitals:   06/18/23 0418 06/18/23 0739 06/18/23 0819 06/18/23 1241  BP:  (!) 176/81 (!) 176/81 (!) 167/92  Pulse:  64 64 87  Resp:      Temp:  98 F (36.7 C)  97.9 F (36.6 C)  TempSrc:      SpO2:  99%  94%  Weight: 92.5 kg     Height:        Intake/Output Summary (Last 24 hours) at 06/18/2023 1245 Last data filed at 06/17/2023 1600 Gross per 24 hour  Intake 200 ml  Output 100 ml  Net 100 ml   Filed Weights   06/16/23 0904 06/18/23 0418  Weight: 93.1 kg 92.5 kg    Exam:  General: 89 y.o. year-old female well developed well nourished in no acute distress.  Alert and oriented x 3. Cardiovascular: Regular rate and rhythm with  no rubs or gallops.  No thyromegaly.  Left JVD noted. Respiratory: Mild rales at bases.. Good inspiratory effort. Abdomen: Soft nontender nondistended with normal bowel sounds x4 quadrants. Musculoskeletal: 1+ pitting edema in lower extremities bilaterally.   Skin: No ulcerative lesions noted or rashes, Psychiatry: Mood is appropriate for condition and setting   Data Reviewed: CBC: Recent Labs  Lab 06/16/23 0912  WBC  6.7  HGB 13.2  HCT 40.5  MCV 87.1  PLT 288   Basic Metabolic Panel: Recent Labs  Lab 06/16/23 0912 06/17/23 0621 06/18/23 0405  NA 135 135 135  K 4.0 3.9 3.5  CL 102 102 102  CO2 21* 22 26  GLUCOSE 144* 224* 144*  BUN 41* 46* 60*  CREATININE 2.01* 2.16* 2.20*  CALCIUM 8.7* 8.2* 8.0*   GFR: Estimated Creatinine Clearance: 21.4 mL/min (A) (by C-G formula based on SCr of 2.2 mg/dL (H)). Liver Function Tests: Recent Labs  Lab 06/16/23 0912  AST 47*  ALT 36  ALKPHOS 114  BILITOT 0.6  PROT 6.9  ALBUMIN 3.4*   No results for input(s): "LIPASE", "AMYLASE" in the last 168 hours. No results for input(s): "AMMONIA" in the last 168 hours. Coagulation Profile: No results for input(s): "INR", "PROTIME" in the last 168 hours. Cardiac Enzymes: No results for input(s): "CKTOTAL", "CKMB", "CKMBINDEX", "TROPONINI" in the last 168 hours. BNP (last 3 results) No results for input(s): "PROBNP" in the last 8760 hours. HbA1C: No results for input(s): "HGBA1C" in the last 72 hours. CBG: No results for input(s): "GLUCAP" in the last 168 hours. Lipid Profile: No results for input(s): "CHOL", "HDL", "LDLCALC", "TRIG", "CHOLHDL", "LDLDIRECT" in the last 72 hours. Thyroid Function Tests: No results for input(s): "TSH", "T4TOTAL", "FREET4", "T3FREE", "THYROIDAB" in the last 72 hours. Anemia Panel: No results for input(s): "VITAMINB12", "FOLATE", "FERRITIN", "TIBC", "IRON", "RETICCTPCT" in the last 72 hours. Urine analysis:    Component Value Date/Time   COLORURINE AMBER (A) 11/24/2022 2239   APPEARANCEUR CLOUDY (A) 11/24/2022 2239   APPEARANCEUR Clear 02/19/2013 0254   LABSPEC 1.016 11/24/2022 2239   LABSPEC 1.006 02/19/2013 0254   PHURINE 5.0 11/24/2022 2239   GLUCOSEU NEGATIVE 11/24/2022 2239   GLUCOSEU Negative 02/19/2013 0254   HGBUR NEGATIVE 11/24/2022 2239   BILIRUBINUR NEGATIVE 11/24/2022 2239   BILIRUBINUR Negative 02/19/2013 0254   KETONESUR 5 (A) 11/24/2022 2239    PROTEINUR 100 (A) 11/24/2022 2239   NITRITE NEGATIVE 11/24/2022 2239   LEUKOCYTESUR MODERATE (A) 11/24/2022 2239   LEUKOCYTESUR 1+ 02/19/2013 0254   Sepsis Labs: @LABRCNTIP (procalcitonin:4,lacticidven:4)  )No results found for this or any previous visit (from the past 240 hour(s)).    Studies: No results found.  Scheduled Meds:  amiodarone  200 mg Oral Daily   apixaban  2.5 mg Oral BID   ascorbic acid  500 mg Oral Daily   carvedilol  3.125 mg Oral BID WC   cholecalciferol  1,000 Units Oral Daily   colestipol  1 g Oral BID   furosemide  60 mg Intravenous BID   glipiZIDE  2.5 mg Oral Q breakfast   hydrALAZINE  50 mg Oral Q8H   isosorbide mononitrate  30 mg Oral Daily   magnesium oxide  400 mg Oral Daily   multivitamin with minerals  1 tablet Oral Daily   pantoprazole  40 mg Oral Daily   sodium chloride flush  3 mL Intravenous Q12H   traZODone  100 mg Oral QHS    Continuous Infusions:   LOS: 1 day  Darlin Drop, MD [facility] Pager [phone]  If 7PM-7AM, please contact night-coverage [email or web address] Password [facility] 06/18/2023, 12:45 PM

## 2023-06-19 DIAGNOSIS — I509 Heart failure, unspecified: Secondary | ICD-10-CM

## 2023-06-19 DIAGNOSIS — R0600 Dyspnea, unspecified: Secondary | ICD-10-CM

## 2023-06-19 LAB — BASIC METABOLIC PANEL
Anion gap: 9 (ref 5–15)
BUN: 61 mg/dL — ABNORMAL HIGH (ref 8–23)
CO2: 27 mmol/L (ref 22–32)
Calcium: 8.3 mg/dL — ABNORMAL LOW (ref 8.9–10.3)
Chloride: 102 mmol/L (ref 98–111)
Creatinine, Ser: 2.26 mg/dL — ABNORMAL HIGH (ref 0.44–1.00)
GFR, Estimated: 20 mL/min — ABNORMAL LOW (ref >60)
Glucose, Bld: 89 mg/dL (ref 70–99)
Potassium: 3.4 mmol/L — ABNORMAL LOW (ref 3.5–5.1)
Sodium: 138 mmol/L (ref 135–145)

## 2023-06-19 MED ORDER — CARVEDILOL 3.125 MG PO TABS: 3.1250 mg | ORAL_TABLET | Freq: Two times a day (BID) | ORAL | 1 refills | Status: DC

## 2023-06-19 MED ORDER — POTASSIUM CHLORIDE CRYS ER 20 MEQ PO TBCR
40.0000 meq | EXTENDED_RELEASE_TABLET | Freq: Once | ORAL | Status: AC
  Administered 2023-06-19: 40 meq via ORAL
  Filled 2023-06-19: qty 2

## 2023-06-19 MED ORDER — ISOSORBIDE MONONITRATE ER 30 MG PO TB24: 30.0000 mg | ORAL_TABLET | Freq: Every day | ORAL | 1 refills | Status: DC

## 2023-06-19 NOTE — Plan of Care (Signed)
Problem: Education: Goal: Knowledge of General Education information will improve Description: Including pain rating scale, medication(s)/side effects and non-pharmacologic comfort measures Outcome: Progressing   Problem: Clinical Measurements: Goal: Ability to maintain clinical measurements within normal limits will improve Outcome: Progressing Goal: Will remain free from infection Outcome: Progressing Goal: Diagnostic test results will improve Outcome: Progressing   Problem: Activity: Goal: Risk for activity intolerance will decrease Outcome: Progressing

## 2023-06-19 NOTE — Progress Notes (Signed)
Occupational Therapy Treatment Patient Details Name: Leslie Parker MRN: [id number] DOB: 1933/02/08 Today's Date: 06/19/2023   History of present illness Leslie Parker is a 89 y.o. female with medical history significant of chronic HFpEF, sick sinus syndrome status post PPM, PAF on Eliquis, IIDM, CKD stage IIIb, COPD Gold stage IV, presented with worsening of shortness of breath.     Symptoms started last night patient developed shortness of breath and could not lie flat on the bed denied any cough no chest pain no fever or chills.  Patient reported that she has been compliant with her CHF and BP medications and at baseline her blood pressure runs SBP in the 130s.  This morning, patient was found to be hypoxic 87% on room air and EMS was called.  EMS arrived who gave patient 1 DuoNeb and 1 albuterol and Solu-Medrol 125 mg and put patient on CPAP.   OT comments  Pt seen for OT treatment on this date. Upon arrival to room pt supine in bed, agreeable to tx. Pt completed bed mobility with MIN A for RLE management. Pt ambulated to the bathroom and completed toilet t/f with CGA +RW. Pt completed hand hygiene in standing and tolerated 5-7 minutes, no LOB noted. Pt does not report any concerns or pain at this time. Pt weaned to RM air during session and pt O2 monitored. Pt's O2 in the 90's, RN notified of pt on Rm air. Pt left supine in bed with call bell within reach and all needs met. Pt making good progress toward goals, will continue to follow POC. Discharge recommendation remains appropriate.        If plan is discharge home, recommend the following:  A little help with walking and/or transfers;A little help with bathing/dressing/bathroom;Assist for transportation;Assistance with cooking/housework   Equipment Recommendations       Recommendations for Other Services      Precautions / Restrictions Precautions Precautions: Fall Restrictions Weight Bearing Restrictions: No       Mobility Bed  Mobility Overal bed mobility: Needs Assistance Bed Mobility: Supine to Sit, Sit to Supine     Supine to sit: Min assist Sit to supine: Min assist   General bed mobility comments: Sit>supine MinA for RLE management. Patient Response: Cooperative  Transfers                         Balance Overall balance assessment: Needs assistance Sitting-balance support: Feet supported Sitting balance-[name]: Good     Standing balance support: Bilateral upper extremity supported, Reliant on assistive device for balance, During functional activity Standing balance-[name]: Good                             ADL either performed or assessed with clinical judgement   ADL Overall ADL's : Needs assistance/impaired                                     Functional mobility during ADLs: Contact guard assist;Rolling walker (2 wheels) General ADL Comments: Pt ambulated to the bathroom and completed toilet t/f with CGA +RW. Pt completed hand hygiene in standing and toleraed 5-7 minutes, no LOB noted.    Extremity/Trunk Assessment Upper Extremity Assessment Upper Extremity Assessment: Overall WFL for tasks assessed   Lower Extremity Assessment Lower Extremity Assessment: Overall WFL for tasks assessed  Vision Patient Visual Report: No change from baseline     Perception     Praxis      Cognition Arousal: Alert Behavior During Therapy: WFL for tasks assessed/performed Overall Cognitive Status: Within Functional Limits for tasks assessed                                 General Comments: AOx4. Pt pleasant and willing to participate in OT session.        Exercises      Shoulder Instructions       General Comments      Pertinent Vitals/ Pain       Pain Assessment Pain Assessment: No/denies pain  Home Living                                          Prior Functioning/Environment               Frequency  Min 1X/week        Progress Toward Goals  OT Goals(current goals can now be found in the care plan section)  Progress towards OT goals: Progressing toward goals     Plan      Co-evaluation                 AM-PAC OT "6 Clicks" Daily Activity     Outcome Measure   Help from another person eating meals?: None Help from another person taking care of personal grooming?: None Help from another person toileting, which includes using toliet, bedpan, or urinal?: None Help from another person bathing (including washing, rinsing, drying)?: A Little Help from another person to put on and taking off regular upper body clothing?: None Help from another person to put on and taking off regular lower body clothing?: None 6 Click Score: 23    End of Session Equipment Utilized During Treatment: Rolling walker (2 wheels)  OT Visit Diagnosis: Unsteadiness on feet (R26.81)   Activity Tolerance Patient tolerated treatment well   Patient Left in bed;with call bell/phone within reach;with bed alarm set   Nurse Communication Mobility status        Time: 1610-9604 OT Time Calculation (min): 18 min  Charges:    Butch Penny, SOT

## 2023-06-19 NOTE — Consult Note (Signed)
Value-Based Care Institute [facility] Liaison Consult Note  06/19/2023  STEPHONIE SAMADI October 31, 1932 [id number]   *Value-Based Care Institute [VBCI] remote coverage review for patient admitted to [facility] - coverage for Leslie Cousin, RN HL - alerted by [facility] Coordinator of patient's admission  Primary Care Provider:  [name], MD with [facility], this provider is listed to provide the community transition of care follow up. Patient is less than 30 days readmission with extreme high risk scores for unplanned readmission noted.  Insurance: [employer] Medicare  Patient is currently active with [facility] for care coordination services.  Patient has been engaged by a [employer].  The community based plan of care has focused on disease management and community resource support.   Admitted with Shortness of Breath, Stage 3 B CKD, Acute on Chronic HF  Patient will receive a post hospital call and will be evaluated for assessments and disease process education.    Plan: Continue to follow for any additional community care coordination needs for post hospital/community needs. Will update Community RN CC of any new post hospital TOC needs.   Of note, [facility] services does not replace or interfere with any services that are needed or arranged by inpatient [facility] care management team.   Charlesetta Shanks, RN, BSN, CCM [place]  [facility], [facility]  [facility] Liaison Direct Dial: [phone] or secure chat Email: [email or web address]

## 2023-06-19 NOTE — TOC Progression Note (Signed)
Transition of Care [facility]) - Progression Note    Patient Details  Name: Leslie Parker MRN: [id number] Date of Birth: 1932-11-07  Transition of Care [facility]) CM/SW Contact  Margarito Liner, LCSW Phone Number: 06/19/2023, 10:38 AM  Clinical Narrative:  [facility] can add OT to home health services at discharge.   Expected Discharge Plan and Services                                               Social Determinants of Health (SDOH) Interventions SDOH Screenings   Food Insecurity: No Food Insecurity (06/17/2023)  Housing: Low Risk  (06/17/2023)  Transportation Needs: No Transportation Needs (06/17/2023)  Utilities: Not At Risk (06/17/2023)  Financial Resource Strain: Low Risk  (06/06/2023)  Tobacco Use: Low Risk  (06/16/2023)    Readmission Risk Interventions    03/21/2023    1:12 PM 08/27/2022   12:05 PM  Readmission Risk Prevention Plan  Transportation Screening Complete Complete  PCP or Specialist Appt within 3-5 Days Complete Complete  HRI or Home Care Consult Complete Complete  Social Work Consult for Recovery Care Planning/Counseling Complete   Palliative Care Screening Not Applicable   Medication Review Oceanographer) Complete Complete

## 2023-06-19 NOTE — TOC Transition Note (Signed)
Transition of Care [facility]) - CM/SW Discharge Note   Patient Details  Name: Leslie Parker MRN: [id number] Date of Birth: Nov 26, 1932  Transition of Care [facility]) CM/SW Contact:  Margarito Liner, LCSW Phone Number: 06/19/2023, 11:31 AM   Clinical Narrative: Patient has orders to discharge home today. [facility] liaison is aware. RN confirmed she does not need home oxygen. No further concerns. CSW signing off.    Final next level of care: Home w Home Health Services Barriers to Discharge: Barriers Resolved   Patient Goals and CMS Choice      Discharge Placement                  Patient to be transferred to facility by: Granddaughter   Patient and family notified of of transfer: 06/19/23  Discharge Plan and Services Additional resources added to the After Visit Summary for                            [facility] Arranged: PT, OT, RN [facility] Agency: [facility] Date [facility] Agency Contacted: 06/19/23   Representative spoke with at [facility] Agency: [facility]  Social Determinants of Health (SDOH) Interventions SDOH Screenings   Food Insecurity: No Food Insecurity (06/17/2023)  Housing: Low Risk  (06/17/2023)  Transportation Needs: No Transportation Needs (06/17/2023)  Utilities: Not At Risk (06/17/2023)  Financial Resource Strain: Low Risk  (06/06/2023)  Tobacco Use: Low Risk  (06/16/2023)     Readmission Risk Interventions    03/21/2023    1:12 PM 08/27/2022   12:05 PM  Readmission Risk Prevention Plan  Transportation Screening Complete Complete  PCP or Specialist Appt within 3-5 Days Complete Complete  HRI or Home Care Consult Complete Complete  Social Work Consult for Recovery Care Planning/Counseling Complete   Palliative Care Screening Not Applicable   Medication Review Oceanographer) Complete Complete

## 2023-06-19 NOTE — Discharge Summary (Signed)
Physician Discharge Summary   Patient: Leslie Parker MRN: [id number] DOB: [birth year]  Admit date:     06/16/2023  Discharge date: 06/19/23  Discharge Physician: Loyce Dys   PCP: [name], MD   Recommendations at discharge:  Follow-up with primary care physician  Discharge Diagnoses: Acute on chronic HFpEF decompensation Acute hypoxic respiratory failure, secondary to the above with bilateral pleural effusions, mild pulmonary edema. Elevated troponin, suspect demand ischemia in the setting of uncontrolled hypertension and acute hypoxia HTN emergency BP is not at goal, elevated PAF, on Eliquis Hypokalemia-repleted IIDM AKI on CKD stage IIIb SSS s/p pacemaker placement Deconditioning      Hospital Course: Leslie Parker is a 89 y.o. female with medical history significant of chronic HFpEF, sick sinus syndrome status post PPM, PAF on Eliquis, IIDM, CKD stage IIIb, COPD Gold stage IV, presented with worsening of shortness of breath.  Workup revealed new hypoxia and elevated BNP with concern for acute on chronic dCHF exacerbation.  Patient underwent IV diuresis with improvement in respiratory function.  Underwent a walk test that shows patient does not meet criteria for home oxygen.  Patient is being discharged today to follow-up with outpatient physicians.  Consultants: None Procedures performed: None Disposition: Home Diet recommendation:  Cardiac diet DISCHARGE MEDICATION: Allergies as of 06/19/2023       Reactions   Baclofen Other (See Comments)   Elevated Cr   Simvastatin Other (See Comments)   Pt denies Elevated LFTs with simva 40mg , stopped and resolved (see MD note 11/10/13)        Medication List     STOP taking these medications    metoprolol tartrate 50 MG tablet Commonly known as: LOPRESSOR       TAKE these medications    albuterol 108 (90 Base) MCG/ACT inhaler Commonly known as: VENTOLIN HFA Inhale 2 puffs into the lungs every 6 (six)  hours as needed.   amiodarone 200 MG tablet Commonly known as: PACERONE Take 200 mg by mouth daily.   apixaban 2.5 MG Tabs tablet Commonly known as: ELIQUIS Take 1 tablet (2.5 mg total) by mouth 2 (two) times daily.   ascorbic acid 250 MG Chew Commonly known as: VITAMIN C Chew 250 mg by mouth daily.   B-12 2500 MCG Tabs Take 2,500 mcg by mouth daily.   carvedilol 3.125 MG tablet Commonly known as: COREG Take 1 tablet (3.125 mg total) by mouth 2 (two) times daily with a meal.   colestipol 1 g tablet Commonly known as: COLESTID Take 1 tablet by mouth 2 (two) times daily.   diltiazem 360 MG 24 hr capsule Commonly known as: CARDIZEM CD Take 360 mg by mouth daily.   furosemide 20 MG tablet Commonly known as: LASIX Take 1 tablet (20 mg total) by mouth daily.   glipiZIDE 2.5 MG 24 hr tablet Commonly known as: GLUCOTROL XL Take 2.5 mg by mouth daily with breakfast.   hydrALAZINE 25 MG tablet Commonly known as: APRESOLINE Take 25 mg by mouth 3 (three) times daily as needed. Take 1 tablet (25 mg total) by mouth 3 (three) times daily as needed (for SBP >150 conistently or DBP>100 persistently)   ipratropium-albuterol 0.5-2.5 (3) MG/3ML Soln Commonly known as: DUONEB Inhale 3 mLs into the lungs every 6 (six) hours as needed.   isosorbide mononitrate 30 MG 24 hr tablet Commonly known as: IMDUR Take 1 tablet (30 mg total) by mouth daily. Start taking on: June 20, 2023   Magnesium Oxide -Mg Supplement  500 MG Caps Take 500 mg by mouth daily.   multivitamin with minerals Tabs tablet Take 1 tablet by mouth daily. One-A-Day Women's Vitamin   omeprazole 20 MG capsule Commonly known as: PRILOSEC Take 20 mg by mouth every morning.   ondansetron 4 MG disintegrating tablet Commonly known as: ZOFRAN-ODT Take 4 mg by mouth every 8 (eight) hours as needed.   traZODone 100 MG tablet Commonly known as: DESYREL Take 100 mg by mouth at bedtime.   Trelegy Ellipta 100-62.5-25  MCG/ACT Aepb Generic drug: Fluticasone-Umeclidin-Vilant Inhale 1 puff into the lungs daily.   Vitamin D-1000 Max St 25 MCG (1000 UT) tablet Generic drug: Cholecalciferol Take 1,000 Units by mouth daily.        Follow-up Information     [facility]. Go in 8 day(s).   Specialty: Cardiology Why: Hospital Follow-Follow-Up -06/27/23 @11 :00 AM [facility], [address], Second Floor Free Valet Parking Contact information: [address] [place] [address] [phone]        [facility] Follow up.   Specialty: Home Health Services Why: They will resume home health services at discharge. Contact information: [address] [place] [address] [phone]                Discharge Exam: Ceasar Mons Weights   06/16/23 0904 06/18/23 0418  Weight: 93.1 kg 92.5 kg   General: 89 y.o. year-old female well developed well nourished in no acute distress.  Alert and oriented x 3. Cardiovascular: Regular rate and rhythm with no rubs or gallops.  No thyromegaly.  Left JVD noted. Respiratory: No Rales. Good inspiratory effort. Abdomen: Soft nontender nondistended with normal bowel sounds x4 quadrants. Musculoskeletal: 1+ pitting edema in lower extremities bilaterally.   Skin: No ulcerative lesions noted or rashes, Psychiatry: Mood is appropriate for condition and setting  Condition at discharge: good  The results of significant diagnostics from this hospitalization (including imaging, microbiology, ancillary and laboratory) are listed below for reference.   Imaging Studies: DG Chest Portable 1 View  Result Date: 06/16/2023 CLINICAL DATA:  Shortness of breath. EXAM: PORTABLE CHEST 1 VIEW COMPARISON:  06/04/2023 FINDINGS: Low volume film. The cardio pericardial silhouette is enlarged. Bibasilar atelectasis or infiltrate with small bilateral pleural effusions. Left permanent pacemaker again  noted. Bones are diffusely demineralized. IMPRESSION: Low volume film with bibasilar atelectasis or infiltrate and small bilateral pleural effusions. Electronically Signed   By: Kennith Center M.D.   On: 06/16/2023 09:49   DG Chest Portable 1 View  Result Date: 06/04/2023 CLINICAL DATA:  Shortness of breath EXAM: PORTABLE CHEST 1 VIEW COMPARISON:  Chest x-ray 04/30/2023 FINDINGS: Left-sided pacemaker is again seen. The heart is enlarged, unchanged. There is new elevation of the right hemidiaphragm versus moderate right pleural effusion. There is a small left pleural effusion. There are patchy opacities in both lower lungs, left greater than right. There is no pneumothorax or acute fracture. Right shoulder arthroplasty is again seen. IMPRESSION: 1. New elevation of the right hemidiaphragm versus moderate right pleural effusion. 2. Small left pleural effusion. 3. Patchy opacities in both lower lungs, left greater than right, which may represent atelectasis or infection. Electronically Signed   By: Darliss Cheney M.D.   On: 06/04/2023 16:44    Microbiology: Results for orders placed or performed during the hospital encounter of 06/04/23  Resp panel by RT-PCR (RSV, Flu A&B, Covid) Anterior Nasal Swab     Status: None  Collection Time: 06/04/23  1:08 PM   Specimen: Anterior Nasal Swab  Result Value Ref Range Status   SARS Coronavirus 2 by RT PCR NEGATIVE NEGATIVE Final    Comment: (NOTE) SARS-CoV-2 target nucleic acids are NOT DETECTED.  The SARS-CoV-2 RNA is generally detectable in upper respiratory specimens during the acute phase of infection. The lowest concentration of SARS-CoV-2 viral copies this assay can detect is 138 copies/mL. A negative result does not preclude SARS-Cov-2 infection and should not be used as the sole basis for treatment or other patient management decisions. A negative result may occur with  improper specimen collection/handling, submission of specimen other than  nasopharyngeal swab, presence of viral mutation(s) within the areas targeted by this assay, and inadequate number of viral copies(<138 copies/mL). A negative result must be combined with clinical observations, patient history, and epidemiological information. The expected result is Negative.  Fact Sheet for Patients:  BloggerCourse.com  Fact Sheet for Healthcare Providers:  SeriousBroker.it  This test is no t yet approved or cleared by the [place] FDA and  has been authorized for detection and/or diagnosis of SARS-CoV-2 by FDA under an Emergency Use Authorization (EUA). This EUA will remain  in effect (meaning this test can be used) for the duration of the COVID-19 declaration under Section 564(b)(1) of the Act, 21 U.S.C.section 360bbb-3(b)(1), unless the authorization is terminated  or revoked sooner.       Influenza A by PCR NEGATIVE NEGATIVE Final   Influenza B by PCR NEGATIVE NEGATIVE Final    Comment: (NOTE) The Xpert Xpress SARS-CoV-2/FLU/RSV plus assay is intended as an aid in the diagnosis of influenza from Nasopharyngeal swab specimens and should not be used as a sole basis for treatment. Nasal washings and aspirates are unacceptable for Xpert Xpress SARS-CoV-2/FLU/RSV testing.  Fact Sheet for Patients: BloggerCourse.com  Fact Sheet for Healthcare Providers: SeriousBroker.it  This test is not yet approved or cleared by the [place] FDA and has been authorized for detection and/or diagnosis of SARS-CoV-2 by FDA under an Emergency Use Authorization (EUA). This EUA will remain in effect (meaning this test can be used) for the duration of the COVID-19 declaration under Section 564(b)(1) of the Act, 21 U.S.C. section 360bbb-3(b)(1), unless the authorization is terminated or revoked.     Resp Syncytial Virus by PCR NEGATIVE NEGATIVE Final    Comment:  (NOTE) Fact Sheet for Patients: BloggerCourse.com  Fact Sheet for Healthcare Providers: SeriousBroker.it  This test is not yet approved or cleared by the [place] FDA and has been authorized for detection and/or diagnosis of SARS-CoV-2 by FDA under an Emergency Use Authorization (EUA). This EUA will remain in effect (meaning this test can be used) for the duration of the COVID-19 declaration under Section 564(b)(1) of the Act, 21 U.S.C. section 360bbb-3(b)(1), unless the authorization is terminated or revoked.  Performed at [facility], [address] [place], [place] [address]     Labs: CBC: Recent Labs  Lab 06/16/23 0912  WBC 6.7  HGB 13.2  HCT 40.5  MCV 87.1  PLT 288   Basic Metabolic Panel: Recent Labs  Lab 06/16/23 0912 06/17/23 0621 06/18/23 0405 06/19/23 0432  NA 135 135 135 138  K 4.0 3.9 3.5 3.4*  CL 102 102 102 102  CO2 21* 22 26 27   GLUCOSE 144* 224* 144* 89  BUN 41* 46* 60* 61*  CREATININE 2.01* 2.16* 2.20* 2.26*  CALCIUM 8.7* 8.2* 8.0* 8.3*   Liver Function Tests: Recent Labs  Lab  06/16/23 0912  AST 47*  ALT 36  ALKPHOS 114  BILITOT 0.6  PROT 6.9  ALBUMIN 3.4*   CBG: No results for input(s): "GLUCAP" in the last 168 hours.  Discharge time spent:  37 minutes.  Signed: Loyce Dys, MD [facility] 06/19/2023

## 2023-06-19 NOTE — Progress Notes (Signed)
Reviewed discharge instructions with patient. AVS provided. PIV removed. All questions answered

## 2023-06-21 ENCOUNTER — Telehealth: Payer: Self-pay | Admitting: *Deleted

## 2023-06-21 DIAGNOSIS — I110 Hypertensive heart disease with heart failure: Secondary | ICD-10-CM | POA: Diagnosis not present

## 2023-06-21 DIAGNOSIS — J9601 Acute respiratory failure with hypoxia: Secondary | ICD-10-CM | POA: Diagnosis not present

## 2023-06-21 DIAGNOSIS — J441 Chronic obstructive pulmonary disease with (acute) exacerbation: Secondary | ICD-10-CM | POA: Diagnosis not present

## 2023-06-21 DIAGNOSIS — N179 Acute kidney failure, unspecified: Secondary | ICD-10-CM | POA: Diagnosis not present

## 2023-06-21 DIAGNOSIS — I5A Non-ischemic myocardial injury (non-traumatic): Secondary | ICD-10-CM | POA: Diagnosis not present

## 2023-06-21 DIAGNOSIS — I5033 Acute on chronic diastolic (congestive) heart failure: Secondary | ICD-10-CM | POA: Diagnosis not present

## 2023-06-21 DIAGNOSIS — D631 Anemia in chronic kidney disease: Secondary | ICD-10-CM | POA: Diagnosis not present

## 2023-06-21 DIAGNOSIS — N184 Chronic kidney disease, stage 4 (severe): Secondary | ICD-10-CM | POA: Diagnosis not present

## 2023-06-21 DIAGNOSIS — E1122 Type 2 diabetes mellitus with diabetic chronic kidney disease: Secondary | ICD-10-CM | POA: Diagnosis not present

## 2023-06-21 NOTE — Patient Outreach (Signed)
  Care Coordination   Follow Up Visit Note   06/21/2023 Name: Leslie Parker MRN: [id number] DOB: 09/25/1932  Leslie Parker is a 89 y.o. year old female who sees [name], MD for primary care. I spoke with  Leslie Parker by phone today.  What matters to the patients health and wellness today?  Stay out of the hospital.  Readmitted 11/24-11/27 for fluid overload.  State she thought the subtle weight gain was due to increased appetite.  Will monitor more closely.     Goals Addressed             This Visit's Progress    Management of chronic health conditions   On track    Interventions Today    Flowsheet Row Most Recent Value  Chronic Disease   Chronic disease during today's visit Congestive Heart Failure (CHF), Chronic Obstructive Pulmonary Disease (COPD), Atrial Fibrillation (AFib), Hypertension (HTN)  General Interventions   General Interventions Discussed/Reviewed General Interventions Reviewed, Doctor Visits, Level of Care  Doctor Visits Discussed/Reviewed Doctor Visits Reviewed, Specialist  PCP/Specialist Visits Compliance with follow-up visit  [facility] clinic appt on 12/5]  Level of Care Personal Care Services  [Does not have personal care services but state daughter is comingin town on 12/7 and will stay with her throughout christmas]  Exercise Interventions   Exercise Discussed/Reviewed Weight Managment, Physical Activity  Physical Activity Discussed/Reviewed Physical Activity Reviewed  [Report home health services to restart today]  Weight Management Weight maintenance  [Discussed daily weights and reporting gain of more than 3 pound in a day and 5 pounds in a week.  Weight yesterday was 170 pounds, today 173.  Feels this is more related to Thanksgiving food yesterday]  Education Interventions   Provided Verbal Education On Nutrition, Medication, When to see the doctor  Nutrition Interventions   Nutrition Discussed/Reviewed Nutrition Reviewed, Adding fruits and  vegetables, Decreasing salt              SDOH assessments and interventions completed:  No     Care Coordination Interventions:  Yes, provided   Follow up plan: Follow up call scheduled for 12/9    Encounter Outcome:  Patient Visit Completed   Rodney Langton, RN, MSN, CCM [place]  [facility], [facility] RN Care Coordinator Direct Dial: [phone] / Main [phone] Fax [phone] Email: [email or web address]

## 2023-06-24 DIAGNOSIS — I110 Hypertensive heart disease with heart failure: Secondary | ICD-10-CM | POA: Diagnosis not present

## 2023-06-24 DIAGNOSIS — D631 Anemia in chronic kidney disease: Secondary | ICD-10-CM | POA: Diagnosis not present

## 2023-06-24 DIAGNOSIS — J441 Chronic obstructive pulmonary disease with (acute) exacerbation: Secondary | ICD-10-CM | POA: Diagnosis not present

## 2023-06-24 DIAGNOSIS — N179 Acute kidney failure, unspecified: Secondary | ICD-10-CM | POA: Diagnosis not present

## 2023-06-24 DIAGNOSIS — N184 Chronic kidney disease, stage 4 (severe): Secondary | ICD-10-CM | POA: Diagnosis not present

## 2023-06-24 DIAGNOSIS — I5A Non-ischemic myocardial injury (non-traumatic): Secondary | ICD-10-CM | POA: Diagnosis not present

## 2023-06-24 DIAGNOSIS — J9601 Acute respiratory failure with hypoxia: Secondary | ICD-10-CM | POA: Diagnosis not present

## 2023-06-24 DIAGNOSIS — E1122 Type 2 diabetes mellitus with diabetic chronic kidney disease: Secondary | ICD-10-CM | POA: Diagnosis not present

## 2023-06-24 DIAGNOSIS — I5033 Acute on chronic diastolic (congestive) heart failure: Secondary | ICD-10-CM | POA: Diagnosis not present

## 2023-06-25 DIAGNOSIS — E1122 Type 2 diabetes mellitus with diabetic chronic kidney disease: Secondary | ICD-10-CM | POA: Diagnosis not present

## 2023-06-25 DIAGNOSIS — D631 Anemia in chronic kidney disease: Secondary | ICD-10-CM | POA: Diagnosis not present

## 2023-06-25 DIAGNOSIS — I110 Hypertensive heart disease with heart failure: Secondary | ICD-10-CM | POA: Diagnosis not present

## 2023-06-25 DIAGNOSIS — I5033 Acute on chronic diastolic (congestive) heart failure: Secondary | ICD-10-CM | POA: Diagnosis not present

## 2023-06-25 DIAGNOSIS — N184 Chronic kidney disease, stage 4 (severe): Secondary | ICD-10-CM | POA: Diagnosis not present

## 2023-06-25 DIAGNOSIS — J441 Chronic obstructive pulmonary disease with (acute) exacerbation: Secondary | ICD-10-CM | POA: Diagnosis not present

## 2023-06-25 DIAGNOSIS — J9601 Acute respiratory failure with hypoxia: Secondary | ICD-10-CM | POA: Diagnosis not present

## 2023-06-25 DIAGNOSIS — N179 Acute kidney failure, unspecified: Secondary | ICD-10-CM | POA: Diagnosis not present

## 2023-06-25 DIAGNOSIS — I5A Non-ischemic myocardial injury (non-traumatic): Secondary | ICD-10-CM | POA: Diagnosis not present

## 2023-06-26 ENCOUNTER — Telehealth: Payer: Self-pay | Admitting: Family

## 2023-06-26 DIAGNOSIS — N184 Chronic kidney disease, stage 4 (severe): Secondary | ICD-10-CM | POA: Diagnosis not present

## 2023-06-26 DIAGNOSIS — J441 Chronic obstructive pulmonary disease with (acute) exacerbation: Secondary | ICD-10-CM | POA: Diagnosis not present

## 2023-06-26 DIAGNOSIS — N179 Acute kidney failure, unspecified: Secondary | ICD-10-CM | POA: Diagnosis not present

## 2023-06-26 DIAGNOSIS — I5A Non-ischemic myocardial injury (non-traumatic): Secondary | ICD-10-CM | POA: Diagnosis not present

## 2023-06-26 DIAGNOSIS — D631 Anemia in chronic kidney disease: Secondary | ICD-10-CM | POA: Diagnosis not present

## 2023-06-26 DIAGNOSIS — I110 Hypertensive heart disease with heart failure: Secondary | ICD-10-CM | POA: Diagnosis not present

## 2023-06-26 DIAGNOSIS — E1122 Type 2 diabetes mellitus with diabetic chronic kidney disease: Secondary | ICD-10-CM | POA: Diagnosis not present

## 2023-06-26 DIAGNOSIS — I5033 Acute on chronic diastolic (congestive) heart failure: Secondary | ICD-10-CM | POA: Diagnosis not present

## 2023-06-26 DIAGNOSIS — J9601 Acute respiratory failure with hypoxia: Secondary | ICD-10-CM | POA: Diagnosis not present

## 2023-06-26 NOTE — Telephone Encounter (Signed)
Lvm to confirm appt for 06/27/23

## 2023-06-27 ENCOUNTER — Other Ambulatory Visit
Admission: RE | Admit: 2023-06-27 | Discharge: 2023-06-27 | Disposition: A | Discharge status: Home / Self Care | Payer: Medicare HMO | Source: Ambulatory Visit | Attending: Family | Admitting: Family

## 2023-06-27 ENCOUNTER — Ambulatory Visit: Payer: Medicare HMO | Admitting: Family

## 2023-06-27 ENCOUNTER — Encounter: Payer: Self-pay | Admitting: Family

## 2023-06-27 VITALS — BP 146/72 | HR 62 | Wt 177.0 lb

## 2023-06-27 DIAGNOSIS — R0683 Snoring: Secondary | ICD-10-CM

## 2023-06-27 DIAGNOSIS — E1122 Type 2 diabetes mellitus with diabetic chronic kidney disease: Secondary | ICD-10-CM

## 2023-06-27 DIAGNOSIS — N184 Chronic kidney disease, stage 4 (severe): Secondary | ICD-10-CM

## 2023-06-27 DIAGNOSIS — I5032 Chronic diastolic (congestive) heart failure: Secondary | ICD-10-CM | POA: Insufficient documentation

## 2023-06-27 DIAGNOSIS — I10 Essential (primary) hypertension: Secondary | ICD-10-CM | POA: Diagnosis not present

## 2023-06-27 DIAGNOSIS — I482 Chronic atrial fibrillation, unspecified: Secondary | ICD-10-CM

## 2023-06-27 DIAGNOSIS — J449 Chronic obstructive pulmonary disease, unspecified: Secondary | ICD-10-CM | POA: Diagnosis not present

## 2023-06-27 LAB — BASIC METABOLIC PANEL
Anion gap: 10 (ref 5–15)
BUN: 46 mg/dL — ABNORMAL HIGH (ref 8–23)
CO2: 25 mmol/L (ref 22–32)
Calcium: 8.8 mg/dL — ABNORMAL LOW (ref 8.9–10.3)
Chloride: 103 mmol/L (ref 98–111)
Creatinine, Ser: 2.32 mg/dL — ABNORMAL HIGH (ref 0.44–1.00)
GFR, Estimated: 20 mL/min — ABNORMAL LOW (ref >60)
Glucose, Bld: 111 mg/dL — ABNORMAL HIGH (ref 70–99)
Potassium: 4.3 mmol/L (ref 3.5–5.1)
Sodium: 138 mmol/L (ref 135–145)

## 2023-06-27 NOTE — Progress Notes (Signed)
Advanced Heart Failure Clinic Note   PCP: Leslie Baas, MD (last seen 11/24) Cardiologist: Leslie Peng, MD (last seen 10/24)  HPI:  [name] is a 89 y/o female with a history of COPD stage IV, PAF, HTN, DM, CKD, hypomagnesemia/ hypokalemia, hyponatremia, pacemaker due to SSS (04/24), asthma & chronic heart failure.   Admitted 03/20/23 due to shortness of breath, weakness and pedal edema. Chest x-ray done today shows retrocardiac strandy opacities like atelectasis. IV lasix given. Completed course of antibiotics for pneumonia. CXR showed multifocal nodular opacity in the left lower lobe, possibly endobronchial. The largest nodule measures 11 mm. 51-month follow-up recommended to ensure resolution.   Admitted 04/30/23 due to shortness of breath and found to by hypoxic. Started on IV solumedrol and given IV magnesium for Mg of 1.2. Lasix had to be held due to rising creatinine. IVF given for continuing rise in creatinine. Renal ultrasound showing multiple bilateral renal cysts. Every other day furosemide resumed.   Admitted 06/04/23 due to increased work of breathing over the past 2 to 3 days. Positive orthopnea and PND. Placed on bipap. BNP 1530. Cardiology consulted. IV lasix given. Weaned off bipap and on to 3L oxygen & then weaned down to 1L  Admitted 06/16/23 due to worsening shortness of breath. Found to have elevated BNP. IV diuresed. Did not meet criteria for home oxygen after walk test completed. Metoprolol stopped & carvedilol started. Elevated troponin thought to be due to demand ischemia.   Echo 02/18/23: EF >55% with mild LVH, Grade I DD, mild LAE Echo 06/06/22: EF 60-65% with mild LVH, mild LAE, mild MR, mild/ moderate TR, mild MS Echo 02/13/23: EF 60-65% with moderate LVH, Grade I DD, mild MR  She presents today for a HF follow-up visit with a chief complaint of moderate fatigue with minimal exertion. Describes this as chronic in nature. Has associated minimal shortness of  breath with moderate exertion, cough, left hip pain and pedal edema along with this. Denies chest pain, palpitations, abdominal distention, dizziness, weight gain or difficulty sleeping. Was unable to use [employer] home sleep study because she doesn't have internet. Denies snoring and says that she no longer has any apneic episodes & doesn't wake herself up from sleep gasping.   Getting PT at home. Weighing daily at home and has been 177 since she was discharged from the hospital.    Per recent discharge summary, she was supposed to stop taking metoprolol and begin carvedilol. She says she was going by her old med list and has continued taking metoprolol. She says that her pharmacy said there was something new ready for her to pick up.   ROS: All systems negative except as listed in HPI, PMH and Problem List.  SH:  Social History   Socioeconomic History   Marital status: Divorced    Spouse name: Not on file   Number of children: 4   Years of education: Not on file   Highest education level: 12th grade  Occupational History   Occupation: Retitred/Disability  Tobacco Use   Smoking status: Never   Smokeless tobacco: Never  Vaping Use   Vaping status: Never Used  Substance and Sexual Activity   Alcohol use: No    Alcohol/week: 0.0 standard drinks of alcohol   Drug use: No   Sexual activity: Never  Other Topics Concern   Not on file  Social History Narrative   Not on file   Social Determinants of Health   Financial Resource Strain: Low Risk  (  06/06/2023)   Overall Financial Resource Strain (CARDIA)    Difficulty of Paying Living Expenses: Not hard at all  Food Insecurity: No Food Insecurity (06/17/2023)   Hunger Vital Sign    Worried About Running Out of Food in the Last Year: Never true    Ran Out of Food in the Last Year: Never true  Transportation Needs: No Transportation Needs (06/17/2023)   PRAPARE - Administrator, Civil Service (Medical): No    Lack of  Transportation (Non-Medical): No  Physical Activity: Not on file  Stress: Not on file  Social Connections: Not on file  Intimate Partner Violence: Not At Risk (06/17/2023)   Humiliation, Afraid, Rape, and Kick questionnaire    Fear of Current or Ex-Partner: No    Emotionally Abused: No    Physically Abused: No    Sexually Abused: No    FH:  Family History  Problem Relation Age of Onset   Heart failure Mother    Hypertension Mother    Emphysema Father     Past Medical History:  Diagnosis Date   Asthma    Atrial fibrillation with RVR (HCC)    Cancer (HCC)    Basal Cell   Diabetes mellitus without complication (HCC)    Heart murmur    Hypertension    Impetigo    Osteoarthritis    Osteopenia    Vomiting    can not due to surgery   Wears dentures    full upper and lower    Current Outpatient Medications  Medication Sig Dispense Refill   albuterol (VENTOLIN HFA) 108 (90 Base) MCG/ACT inhaler Inhale 2 puffs into the lungs every 6 (six) hours as needed.     amiodarone (PACERONE) 200 MG tablet Take 200 mg by mouth daily.     apixaban (ELIQUIS) 2.5 MG TABS tablet Take 1 tablet (2.5 mg total) by mouth 2 (two) times daily. 60 tablet 1   ascorbic acid (VITAMIN C) 250 MG CHEW Chew 250 mg by mouth daily.     carvedilol (COREG) 3.125 MG tablet Take 1 tablet (3.125 mg total) by mouth 2 (two) times daily with a meal. 60 tablet 1   Cholecalciferol (VITAMIN D-1000 MAX ST) 25 MCG (1000 UT) tablet Take 1,000 Units by mouth daily.     colestipol (COLESTID) 1 g tablet Take 1 tablet by mouth 2 (two) times daily.     Cyanocobalamin (B-12) 2500 MCG TABS Take 2,500 mcg by mouth daily.     diltiazem (CARDIZEM CD) 360 MG 24 hr capsule Take 360 mg by mouth daily.     furosemide (LASIX) 20 MG tablet Take 1 tablet (20 mg total) by mouth daily. 30 tablet 1   glipiZIDE (GLUCOTROL XL) 2.5 MG 24 hr tablet Take 2.5 mg by mouth daily with breakfast.     hydrALAZINE (APRESOLINE) 25 MG tablet Take 25 mg by  mouth 3 (three) times daily as needed. Take 1 tablet (25 mg total) by mouth 3 (three) times daily as needed (for SBP >150 conistently or DBP>100 persistently)     ipratropium-albuterol (DUONEB) 0.5-2.5 (3) MG/3ML SOLN Inhale 3 mLs into the lungs every 6 (six) hours as needed.     isosorbide mononitrate (IMDUR) 30 MG 24 hr tablet Take 1 tablet (30 mg total) by mouth daily. 30 tablet 1   Magnesium Oxide 500 MG CAPS Take 500 mg by mouth daily.     Multiple Vitamin (MULTIVITAMIN WITH MINERALS) TABS tablet Take 1 tablet by mouth daily.  One-A-Day Women's Vitamin     omeprazole (PRILOSEC) 20 MG capsule Take 20 mg by mouth every morning.     ondansetron (ZOFRAN-ODT) 4 MG disintegrating tablet Take 4 mg by mouth every 8 (eight) hours as needed.     traZODone (DESYREL) 100 MG tablet Take 100 mg by mouth at bedtime.     TRELEGY ELLIPTA 100-62.5-25 MCG/ACT AEPB Inhale 1 puff into the lungs daily.     No current facility-administered medications for this visit.   Vitals:   06/27/23 1120  BP: (!) 146/72  Pulse: 62  SpO2: 96%  Weight: 177 lb (80.3 kg)   Wt Readings from Last 3 Encounters:  06/27/23 177 lb (80.3 kg)  06/18/23 203 lb 14.8 oz (92.5 kg)  06/04/23 205 lb 3.2 oz (93.1 kg)   Lab Results  Component Value Date   CREATININE 2.32 (H) 06/27/2023   CREATININE 2.26 (H) 06/19/2023   CREATININE 2.20 (H) 06/18/2023   PHYSICAL EXAM:  General:  Well appearing. No resp difficulty HEENT: normal Neck: supple. JVP flat. No lymphadenopathy or thryomegaly appreciated. Cor: PMI normal. Regular rate & irregular rhythm. No rubs, gallops or murmurs. Lungs: clear Abdomen: soft, nontender, nondistended. No hepatosplenomegaly. No bruits or masses.  Extremities: no cyanosis, clubbing, rash, 2+ pitting edema bilateral lower legs Neuro: alert & orientedx3, cranial nerves grossly intact. Moves all 4 extremities w/o difficulty. Affect pleasant.   ECG: not done   ASSESSMENT & PLAN:  1: NICM with preserved  ejection fraction- - suspect due to AF/ COPD/ ? OSA - NYHA class III - euvolemic - weighing daily; reminded to call for overnight weight gain of > 2 pounds or a weekly weight gain of > 5 pounds - weight down 3 pounds from last visit here 6 weeks ago - Echo 02/18/23: EF >55% with mild LVH, Grade I DD, mild LAE - Echo 06/06/22: EF 60-65% with mild LVH, mild LAE, mild MR, mild/ moderate TR, mild MS - Echo 02/13/23: EF 60-65% with moderate LVH, Grade I DD, mild MR - stop metoprolol and begin carvedilol 3.125mg  BID - continue furosemide 20mg  daily - she has ordered compression socks and reviewed to put them on every morning with removal at bedtime - BMET today as she may need potassium supplement - BNP 06/16/23 was 1602.3  2: HTN- - BP 146/72 - saw PCP Leslie Parker) 11/24 - continue carvedilol 3.125mg  BID - continue hydralazone 25mg  TID PRN - continue isosorbide MN 30mg  daily - BMP 06/19/23 showed sodium 138, potassium 3.4, creatinine 2.26 & GFR 20 - BMET today  3: Atrial fibrillation- - saw cardiology Leslie Parker) 10/24 - continue amiodarone 200mg  daily - continue apixaban 2.5mg  BID - continue carvedilol 3.125mg  BID - continue dilitazem 360mg  daily - pacemaker itself does move and she can make one side of it stick up (6 months post implantation); she is going to discuss w/ cardiology  4: DM- - A1c 03/21/23 was 5.8% - continue glucotrol XL 2.5mg  daily  5: COPD- - saw pulmonology Leslie Parker) 07/24 - does not wear oxygen  6: Snoring- - resolved - Leslie Parker said for patient to throw [employer] home sleep study box away since she had opened it - not interested in sleep lab referral  Return in 1 month, sooner if needed.

## 2023-06-27 NOTE — Patient Instructions (Addendum)
Continue weighing daily and call for an overnight weight gain of 3 pounds or more or a weekly weight gain of more than 5 pounds.  When you pick up the carvedilol from the pharmacy, stop the metoprolol. You will take the carvedilol twice daily.   Go DOWN to LOWER LEVEL (LL) to have your blood work completed inside of [employer] office.  We will only call you if the results are abnormal or if the provider would like to make medication changes.

## 2023-07-01 ENCOUNTER — Telehealth: Payer: Self-pay

## 2023-07-01 ENCOUNTER — Ambulatory Visit: Payer: Self-pay | Admitting: *Deleted

## 2023-07-01 DIAGNOSIS — M24852 Other specific joint derangements of left hip, not elsewhere classified: Secondary | ICD-10-CM | POA: Diagnosis not present

## 2023-07-01 DIAGNOSIS — M545 Low back pain, unspecified: Secondary | ICD-10-CM | POA: Diagnosis not present

## 2023-07-01 DIAGNOSIS — M542 Cervicalgia: Secondary | ICD-10-CM | POA: Diagnosis not present

## 2023-07-01 DIAGNOSIS — Z96642 Presence of left artificial hip joint: Secondary | ICD-10-CM | POA: Diagnosis not present

## 2023-07-01 DIAGNOSIS — M25562 Pain in left knee: Secondary | ICD-10-CM | POA: Diagnosis not present

## 2023-07-01 NOTE — Patient Outreach (Signed)
  Care Coordination   Follow Up Visit Note   07/01/2023 Name: Leslie Parker MRN: [id number] DOB: [birth year]  Leslie Parker is a 89 y.o. year old female who sees [name], MD for primary care. I spoke with  [name] by phone today.  What matters to the patients health and wellness today?  Patient report she is doing better since [facility] has started, daughter also has arrived in town, will be here until after Christmas.  Denies any urgent concerns, encouraged to contact this care manager with questions.     Goals Addressed             This Visit's Progress    Free from recurrent fall   On track    Care Coordination Interventions: Provided written and verbal education re: potential causes of falls and Fall prevention strategies Reviewed medications and discussed potential side effects of medications such as dizziness and frequent urination Advised patient of importance of notifying provider of falls Assessed for falls since last encounter Assessed patients knowledge of fall risk prevention secondary to previously provided education      Management of chronic health conditions   On track    Interventions Today    Flowsheet Row Most Recent Value  Chronic Disease   Chronic disease during today's visit Congestive Heart Failure (CHF), Chronic Obstructive Pulmonary Disease (COPD), Atrial Fibrillation (AFib), Hypertension (HTN), Other  [hip/back pain]  General Interventions   General Interventions Discussed/Reviewed General Interventions Reviewed, Doctor Visits, [employer] she has re-established with [facility] team]  Doctor Visits Discussed/Reviewed Doctor Visits Reviewed, PCP, Specialist  [GI 12/23, HF 1/9,ortho 1/20]  PCP/Specialist Visits Compliance with follow-up visit  [seen by ortho today, received hip injection, but also received referral for spine specialist scheduled for 1/9]  Exercise Interventions   Exercise Discussed/Reviewed Physical Activity, Weight  Managment  Physical Activity Discussed/Reviewed Physical Activity Reviewed  Jorje Guild with HHPT/OT]  Weight Management Weight maintenance  [weight stable at 177 pounds]  Education Interventions   Education Provided Provided Education  Provided Verbal Education On Medication, When to see the doctor  [Taking medication as instructed, recently changed from metoprolol to carvedilol]              SDOH assessments and interventions completed:  No     Care Coordination Interventions:  Yes, provided   Follow up plan: Follow up call scheduled for 12/31    Encounter Outcome:  Patient Visit Completed   Rodney Langton, RN, MSN, CCM [place]  [facility], [facility] RN Care Coordinator Direct Dial: [phone] / Main [phone] Fax [phone] Email: [email or web address]: [email or web address]

## 2023-07-01 NOTE — Telephone Encounter (Signed)
Left voicemail for pt inquiring if she still has home sleep study device. Pt does not require insurance precert. If patient still wishes to take home sleep test, device will still need to be registered before she can take test.

## 2023-07-03 DIAGNOSIS — J441 Chronic obstructive pulmonary disease with (acute) exacerbation: Secondary | ICD-10-CM | POA: Diagnosis not present

## 2023-07-03 DIAGNOSIS — I5A Non-ischemic myocardial injury (non-traumatic): Secondary | ICD-10-CM | POA: Diagnosis not present

## 2023-07-03 DIAGNOSIS — D631 Anemia in chronic kidney disease: Secondary | ICD-10-CM | POA: Diagnosis not present

## 2023-07-03 DIAGNOSIS — J9601 Acute respiratory failure with hypoxia: Secondary | ICD-10-CM | POA: Diagnosis not present

## 2023-07-03 DIAGNOSIS — I5033 Acute on chronic diastolic (congestive) heart failure: Secondary | ICD-10-CM | POA: Diagnosis not present

## 2023-07-03 DIAGNOSIS — N184 Chronic kidney disease, stage 4 (severe): Secondary | ICD-10-CM | POA: Diagnosis not present

## 2023-07-03 DIAGNOSIS — N179 Acute kidney failure, unspecified: Secondary | ICD-10-CM | POA: Diagnosis not present

## 2023-07-03 DIAGNOSIS — E1122 Type 2 diabetes mellitus with diabetic chronic kidney disease: Secondary | ICD-10-CM | POA: Diagnosis not present

## 2023-07-03 DIAGNOSIS — I110 Hypertensive heart disease with heart failure: Secondary | ICD-10-CM | POA: Diagnosis not present

## 2023-07-12 DIAGNOSIS — E1122 Type 2 diabetes mellitus with diabetic chronic kidney disease: Secondary | ICD-10-CM | POA: Diagnosis not present

## 2023-07-12 DIAGNOSIS — N184 Chronic kidney disease, stage 4 (severe): Secondary | ICD-10-CM | POA: Diagnosis not present

## 2023-07-12 DIAGNOSIS — N179 Acute kidney failure, unspecified: Secondary | ICD-10-CM | POA: Diagnosis not present

## 2023-07-12 DIAGNOSIS — J9601 Acute respiratory failure with hypoxia: Secondary | ICD-10-CM | POA: Diagnosis not present

## 2023-07-12 DIAGNOSIS — I5033 Acute on chronic diastolic (congestive) heart failure: Secondary | ICD-10-CM | POA: Diagnosis not present

## 2023-07-12 DIAGNOSIS — D631 Anemia in chronic kidney disease: Secondary | ICD-10-CM | POA: Diagnosis not present

## 2023-07-12 DIAGNOSIS — I5A Non-ischemic myocardial injury (non-traumatic): Secondary | ICD-10-CM | POA: Diagnosis not present

## 2023-07-12 DIAGNOSIS — J441 Chronic obstructive pulmonary disease with (acute) exacerbation: Secondary | ICD-10-CM | POA: Diagnosis not present

## 2023-07-12 DIAGNOSIS — I110 Hypertensive heart disease with heart failure: Secondary | ICD-10-CM | POA: Diagnosis not present

## 2023-07-15 DIAGNOSIS — E1122 Type 2 diabetes mellitus with diabetic chronic kidney disease: Secondary | ICD-10-CM | POA: Diagnosis not present

## 2023-07-15 DIAGNOSIS — N184 Chronic kidney disease, stage 4 (severe): Secondary | ICD-10-CM | POA: Diagnosis not present

## 2023-07-15 DIAGNOSIS — I480 Paroxysmal atrial fibrillation: Secondary | ICD-10-CM | POA: Diagnosis not present

## 2023-07-15 DIAGNOSIS — I110 Hypertensive heart disease with heart failure: Secondary | ICD-10-CM | POA: Diagnosis not present

## 2023-07-15 DIAGNOSIS — N179 Acute kidney failure, unspecified: Secondary | ICD-10-CM | POA: Diagnosis not present

## 2023-07-15 DIAGNOSIS — D631 Anemia in chronic kidney disease: Secondary | ICD-10-CM | POA: Diagnosis not present

## 2023-07-15 DIAGNOSIS — J9601 Acute respiratory failure with hypoxia: Secondary | ICD-10-CM | POA: Diagnosis not present

## 2023-07-15 DIAGNOSIS — I5033 Acute on chronic diastolic (congestive) heart failure: Secondary | ICD-10-CM | POA: Diagnosis not present

## 2023-07-15 DIAGNOSIS — J4489 Other specified chronic obstructive pulmonary disease: Secondary | ICD-10-CM | POA: Diagnosis not present

## 2023-07-16 DIAGNOSIS — I110 Hypertensive heart disease with heart failure: Secondary | ICD-10-CM | POA: Diagnosis not present

## 2023-07-16 DIAGNOSIS — I480 Paroxysmal atrial fibrillation: Secondary | ICD-10-CM | POA: Diagnosis not present

## 2023-07-16 DIAGNOSIS — J4489 Other specified chronic obstructive pulmonary disease: Secondary | ICD-10-CM | POA: Diagnosis not present

## 2023-07-16 DIAGNOSIS — N179 Acute kidney failure, unspecified: Secondary | ICD-10-CM | POA: Diagnosis not present

## 2023-07-16 DIAGNOSIS — N184 Chronic kidney disease, stage 4 (severe): Secondary | ICD-10-CM | POA: Diagnosis not present

## 2023-07-16 DIAGNOSIS — I5033 Acute on chronic diastolic (congestive) heart failure: Secondary | ICD-10-CM | POA: Diagnosis not present

## 2023-07-16 DIAGNOSIS — D631 Anemia in chronic kidney disease: Secondary | ICD-10-CM | POA: Diagnosis not present

## 2023-07-16 DIAGNOSIS — J9601 Acute respiratory failure with hypoxia: Secondary | ICD-10-CM | POA: Diagnosis not present

## 2023-07-16 DIAGNOSIS — E1122 Type 2 diabetes mellitus with diabetic chronic kidney disease: Secondary | ICD-10-CM | POA: Diagnosis not present

## 2023-07-22 DIAGNOSIS — I110 Hypertensive heart disease with heart failure: Secondary | ICD-10-CM | POA: Diagnosis not present

## 2023-07-22 DIAGNOSIS — E1122 Type 2 diabetes mellitus with diabetic chronic kidney disease: Secondary | ICD-10-CM | POA: Diagnosis not present

## 2023-07-22 DIAGNOSIS — N179 Acute kidney failure, unspecified: Secondary | ICD-10-CM | POA: Diagnosis not present

## 2023-07-22 DIAGNOSIS — I480 Paroxysmal atrial fibrillation: Secondary | ICD-10-CM | POA: Diagnosis not present

## 2023-07-22 DIAGNOSIS — N184 Chronic kidney disease, stage 4 (severe): Secondary | ICD-10-CM | POA: Diagnosis not present

## 2023-07-22 DIAGNOSIS — J4489 Other specified chronic obstructive pulmonary disease: Secondary | ICD-10-CM | POA: Diagnosis not present

## 2023-07-22 DIAGNOSIS — J9601 Acute respiratory failure with hypoxia: Secondary | ICD-10-CM | POA: Diagnosis not present

## 2023-07-22 DIAGNOSIS — I5033 Acute on chronic diastolic (congestive) heart failure: Secondary | ICD-10-CM | POA: Diagnosis not present

## 2023-07-22 DIAGNOSIS — D631 Anemia in chronic kidney disease: Secondary | ICD-10-CM | POA: Diagnosis not present

## 2023-07-23 ENCOUNTER — Ambulatory Visit: Payer: Self-pay | Admitting: *Deleted

## 2023-07-23 NOTE — Patient Outreach (Signed)
  Care Coordination   07/23/2023 Name: Leslie Parker MRN: [id number] DOB: Jan 06, 1933   Care Coordination Outreach Attempts:  An unsuccessful outreach was attempted for an appointment today.  Follow Up Plan:  Additional outreach attempts will be made to offer the patient complex care management information and services.   Encounter Outcome:  No Answer   Care Coordination Interventions:  No, not indicated    Odella Ku, RN, MSN, CCM [place]  [facility], [facility] RN Care Coordinator Direct Dial: [phone] / Main [phone] Fax [phone] Email: [email or web address]: [email or web address]

## 2023-07-29 DIAGNOSIS — J9601 Acute respiratory failure with hypoxia: Secondary | ICD-10-CM | POA: Diagnosis not present

## 2023-07-29 DIAGNOSIS — E1122 Type 2 diabetes mellitus with diabetic chronic kidney disease: Secondary | ICD-10-CM | POA: Diagnosis not present

## 2023-07-29 DIAGNOSIS — I480 Paroxysmal atrial fibrillation: Secondary | ICD-10-CM | POA: Diagnosis not present

## 2023-07-29 DIAGNOSIS — D631 Anemia in chronic kidney disease: Secondary | ICD-10-CM | POA: Diagnosis not present

## 2023-07-29 DIAGNOSIS — I5033 Acute on chronic diastolic (congestive) heart failure: Secondary | ICD-10-CM | POA: Diagnosis not present

## 2023-07-29 DIAGNOSIS — N179 Acute kidney failure, unspecified: Secondary | ICD-10-CM | POA: Diagnosis not present

## 2023-07-29 DIAGNOSIS — I110 Hypertensive heart disease with heart failure: Secondary | ICD-10-CM | POA: Diagnosis not present

## 2023-07-29 DIAGNOSIS — N184 Chronic kidney disease, stage 4 (severe): Secondary | ICD-10-CM | POA: Diagnosis not present

## 2023-07-29 DIAGNOSIS — J4489 Other specified chronic obstructive pulmonary disease: Secondary | ICD-10-CM | POA: Diagnosis not present

## 2023-07-30 DIAGNOSIS — I110 Hypertensive heart disease with heart failure: Secondary | ICD-10-CM | POA: Diagnosis not present

## 2023-07-30 DIAGNOSIS — J4489 Other specified chronic obstructive pulmonary disease: Secondary | ICD-10-CM | POA: Diagnosis not present

## 2023-07-30 DIAGNOSIS — N179 Acute kidney failure, unspecified: Secondary | ICD-10-CM | POA: Diagnosis not present

## 2023-07-30 DIAGNOSIS — J9601 Acute respiratory failure with hypoxia: Secondary | ICD-10-CM | POA: Diagnosis not present

## 2023-07-30 DIAGNOSIS — I5033 Acute on chronic diastolic (congestive) heart failure: Secondary | ICD-10-CM | POA: Diagnosis not present

## 2023-07-30 DIAGNOSIS — N184 Chronic kidney disease, stage 4 (severe): Secondary | ICD-10-CM | POA: Diagnosis not present

## 2023-07-30 DIAGNOSIS — I480 Paroxysmal atrial fibrillation: Secondary | ICD-10-CM | POA: Diagnosis not present

## 2023-07-30 DIAGNOSIS — E1122 Type 2 diabetes mellitus with diabetic chronic kidney disease: Secondary | ICD-10-CM | POA: Diagnosis not present

## 2023-07-30 DIAGNOSIS — D631 Anemia in chronic kidney disease: Secondary | ICD-10-CM | POA: Diagnosis not present

## 2023-07-31 NOTE — Progress Notes (Deleted)
 Advanced Heart Failure Clinic Note   PCP: Sherial Bail, MD (last seen 11/24) Cardiologist: Florencio Kava, MD (last seen 10/24)  Chief complaint:  HPI:  Ms Yambao is a 89 y/o female with a history of COPD stage IV, PAF, HTN, DM, CKD, hypomagnesemia/ hypokalemia, hyponatremia, pacemaker due to SSS (04/24), asthma & chronic heart failure.   Admitted 03/20/23 due to shortness of breath, weakness and pedal edema. Chest x-ray done today shows retrocardiac strandy opacities like atelectasis. IV lasix  given. Completed course of antibiotics for pneumonia. CXR showed multifocal nodular opacity in the left lower lobe, possibly endobronchial. The largest nodule measures 11 mm. 4-month follow-up recommended to ensure resolution.   Admitted 04/30/23 due to shortness of breath and found to by hypoxic. Started on IV solumedrol and given IV magnesium  for Mg of 1.2. Lasix  had to be held due to rising creatinine. IVF given for continuing rise in creatinine. Renal ultrasound showing multiple bilateral renal cysts. Every other day furosemide  resumed.   Admitted 06/04/23 due to increased work of breathing over the past 2 to 3 days. Positive orthopnea and PND. Placed on bipap. BNP 1530. Cardiology consulted. IV lasix  given. Weaned off bipap and on to 3L oxygen & then weaned down to 1L  Admitted 06/16/23 due to worsening shortness of breath. Found to have elevated BNP. IV diuresed. Did not meet criteria for home oxygen after walk test completed. Metoprolol  stopped & carvedilol  started. Elevated troponin thought to be due to demand ischemia.   Echo 02/18/23: EF >55% with mild LVH, Grade I DD, mild LAE Echo 06/06/22: EF 60-65% with mild LVH, mild LAE, mild MR, mild/ moderate TR, mild MS Echo 02/13/23: EF 60-65% with moderate LVH, Grade I DD, mild MR  She presents today for a HF follow-up visit with a chief complaint of   At last visit, her beta blocker was changed from metoprolol  to carvedilol .   ROS: All  systems negative except as listed in HPI, PMH and Problem List.  SH:  Social History   Socioeconomic History   Marital status: Divorced    Spouse name: Not on file   Number of children: 4   Years of education: Not on file   Highest education level: 12th grade  Occupational History   Occupation: Retitred/Disability  Tobacco Use   Smoking status: Never   Smokeless tobacco: Never  Vaping Use   Vaping status: Never Used  Substance and Sexual Activity   Alcohol  use: No    Alcohol /week: 0.0 standard drinks of alcohol    Drug use: No   Sexual activity: Never  Other Topics Concern   Not on file  Social History Narrative   Not on file   Social Drivers of Health   Financial Resource Strain: Low Risk  (06/06/2023)   Overall Financial Resource Strain (CARDIA)    Difficulty of Paying Living Expenses: Not hard at all  Food Insecurity: No Food Insecurity (06/17/2023)   Hunger Vital Sign    Worried About Running Out of Food in the Last Year: Never true    Ran Out of Food in the Last Year: Never true  Transportation Needs: No Transportation Needs (06/17/2023)   PRAPARE - Administrator, Civil Service (Medical): No    Lack of Transportation (Non-Medical): No  Physical Activity: Not on file  Stress: Not on file  Social Connections: Not on file  Intimate Partner Violence: Not At Risk (06/17/2023)   Humiliation, Afraid, Rape, and Kick questionnaire    Fear of Current  or Ex-Partner: No    Emotionally Abused: No    Physically Abused: No    Sexually Abused: No    FH:  Family History  Problem Relation Age of Onset   Heart failure Mother    Hypertension Mother    Emphysema Father     Past Medical History:  Diagnosis Date   Asthma    Atrial fibrillation with RVR (HCC)    Cancer (HCC)    Basal Cell   Diabetes mellitus without complication (HCC)    Heart murmur    Hypertension    Impetigo    Osteoarthritis    Osteopenia    Vomiting    can not due to surgery    Wears dentures    full upper and lower    Current Outpatient Medications  Medication Sig Dispense Refill   albuterol  (VENTOLIN  HFA) 108 (90 Base) MCG/ACT inhaler Inhale 2 puffs into the lungs every 6 (six) hours as needed.     amiodarone  (PACERONE ) 200 MG tablet Take 200 mg by mouth daily.     apixaban  (ELIQUIS ) 2.5 MG TABS tablet Take 1 tablet (2.5 mg total) by mouth 2 (two) times daily. 60 tablet 1   ascorbic acid  (VITAMIN C ) 250 MG CHEW Chew 250 mg by mouth daily.     carvedilol  (COREG ) 3.125 MG tablet Take 1 tablet (3.125 mg total) by mouth 2 (two) times daily with a meal. 60 tablet 1   Cholecalciferol  (VITAMIN D -1000 MAX ST) 25 MCG (1000 UT) tablet Take 1,000 Units by mouth daily.     colestipol  (COLESTID ) 1 g tablet Take 1 tablet by mouth 2 (two) times daily.     Cyanocobalamin  (B-12) 2500 MCG TABS Take 2,500 mcg by mouth daily.     diltiazem  (CARDIZEM  CD) 360 MG 24 hr capsule Take 360 mg by mouth daily.     furosemide  (LASIX ) 20 MG tablet Take 1 tablet (20 mg total) by mouth daily. 30 tablet 1   glipiZIDE  (GLUCOTROL  XL) 2.5 MG 24 hr tablet Take 2.5 mg by mouth daily with breakfast.     hydrALAZINE  (APRESOLINE ) 25 MG tablet Take 25 mg by mouth 3 (three) times daily as needed. Take 1 tablet (25 mg total) by mouth 3 (three) times daily as needed (for SBP >150 conistently or DBP>100 persistently)     ipratropium-albuterol  (DUONEB) 0.5-2.5 (3) MG/3ML SOLN Inhale 3 mLs into the lungs every 6 (six) hours as needed.     isosorbide  mononitrate (IMDUR ) 30 MG 24 hr tablet Take 1 tablet (30 mg total) by mouth daily. 30 tablet 1   Magnesium  Oxide 500 MG CAPS Take 500 mg by mouth daily.     Multiple Vitamin (MULTIVITAMIN WITH MINERALS) TABS tablet Take 1 tablet by mouth daily. One-A-Day Women's Vitamin     omeprazole (PRILOSEC) 20 MG capsule Take 20 mg by mouth every morning.     ondansetron  (ZOFRAN -ODT) 4 MG disintegrating tablet Take 4 mg by mouth every 8 (eight) hours as needed.     traZODone   (DESYREL ) 100 MG tablet Take 100 mg by mouth at bedtime.     TRELEGY ELLIPTA 100-62.5-25 MCG/ACT AEPB Inhale 1 puff into the lungs daily.     No current facility-administered medications for this visit.     PHYSICAL EXAM:  General:  Well appearing. No resp difficulty HEENT: normal Neck: supple. JVP flat. No lymphadenopathy or thryomegaly appreciated. Cor: PMI normal. Regular rate & irregular rhythm. No rubs, gallops or murmurs. Lungs: clear Abdomen: soft, nontender, nondistended.  No hepatosplenomegaly. No bruits or masses.  Extremities: no cyanosis, clubbing, rash, 2+ pitting edema bilateral lower legs Neuro: alert & orientedx3, cranial nerves grossly intact. Moves all 4 extremities w/o difficulty. Affect pleasant.   ECG: not done   ASSESSMENT & PLAN:  1: NICM with preserved ejection fraction- - suspect due to AF/ COPD/ ? OSA - NYHA class III - euvolemic - weighing daily; reminded to call for overnight weight gain of > 2 pounds or a weekly weight gain of > 5 pounds - weight 177 pounds from last visit here 1 month ago - Echo 02/18/23: EF >55% with mild LVH, Grade I DD, mild LAE - Echo 06/06/22: EF 60-65% with mild LVH, mild LAE, mild MR, mild/ moderate TR, mild MS - Echo 02/13/23: EF 60-65% with moderate LVH, Grade I DD, mild MR - continue carvedilol  3.125mg  BID - continue furosemide  20mg  daily - unable to use SGLT2/ MRA due to renal function - she has ordered compression socks and reviewed to put them on every morning with removal at bedtime - BNP 06/16/23 was 1602.3  2: HTN- - BP  - saw PCP Deretha) 11/24 - continue carvedilol  3.125mg  BID - continue hydralazone 25mg  TID PRN - continue isosorbide  MN 30mg  daily - BMP 06/27/23 showed sodium 138, potassium 4.3, creatinine 2.32 & GFR 20 - recheck BMET today  3: Atrial fibrillation- - saw cardiology Philippe) 10/24 - continue amiodarone  200mg  daily - continue apixaban  2.5mg  BID - continue carvedilol  3.125mg  BID -  continue dilitazem 360mg  daily - pacemaker itself does move and she can make one side of it stick up (6 months post implantation); she is going to discuss w/ cardiology  4: DM- - A1c 03/21/23 was 5.8% - continue glucotrol  XL 2.5mg  daily  5: COPD- - saw pulmonology Alica) 07/24 - does not wear oxygen

## 2023-08-01 ENCOUNTER — Telehealth: Payer: Self-pay | Admitting: Family

## 2023-08-01 ENCOUNTER — Encounter: Payer: Medicare HMO | Admitting: Family

## 2023-08-01 NOTE — Telephone Encounter (Signed)
 Patient did not show for her Heart Failure Clinic appointment on 08/01/23.

## 2023-08-05 DIAGNOSIS — I5033 Acute on chronic diastolic (congestive) heart failure: Secondary | ICD-10-CM | POA: Diagnosis not present

## 2023-08-05 DIAGNOSIS — I480 Paroxysmal atrial fibrillation: Secondary | ICD-10-CM | POA: Diagnosis not present

## 2023-08-05 DIAGNOSIS — N179 Acute kidney failure, unspecified: Secondary | ICD-10-CM | POA: Diagnosis not present

## 2023-08-05 DIAGNOSIS — E1122 Type 2 diabetes mellitus with diabetic chronic kidney disease: Secondary | ICD-10-CM | POA: Diagnosis not present

## 2023-08-05 DIAGNOSIS — N184 Chronic kidney disease, stage 4 (severe): Secondary | ICD-10-CM | POA: Diagnosis not present

## 2023-08-05 DIAGNOSIS — J9601 Acute respiratory failure with hypoxia: Secondary | ICD-10-CM | POA: Diagnosis not present

## 2023-08-05 DIAGNOSIS — J4489 Other specified chronic obstructive pulmonary disease: Secondary | ICD-10-CM | POA: Diagnosis not present

## 2023-08-05 DIAGNOSIS — D631 Anemia in chronic kidney disease: Secondary | ICD-10-CM | POA: Diagnosis not present

## 2023-08-05 DIAGNOSIS — I110 Hypertensive heart disease with heart failure: Secondary | ICD-10-CM | POA: Diagnosis not present

## 2023-08-07 ENCOUNTER — Inpatient Hospital Stay
Admission: EM | Admit: 2023-08-07 | Discharge: 2023-08-11 | DRG: 291 | Disposition: A | Discharge status: Home Health | Payer: Medicare HMO | Attending: Internal Medicine | Admitting: Internal Medicine

## 2023-08-07 ENCOUNTER — Other Ambulatory Visit: Payer: Self-pay

## 2023-08-07 ENCOUNTER — Emergency Department: Payer: Medicare HMO

## 2023-08-07 DIAGNOSIS — I700 Atherosclerosis of aorta: Secondary | ICD-10-CM | POA: Diagnosis not present

## 2023-08-07 DIAGNOSIS — Z7901 Long term (current) use of anticoagulants: Secondary | ICD-10-CM | POA: Diagnosis not present

## 2023-08-07 DIAGNOSIS — R0902 Hypoxemia: Secondary | ICD-10-CM | POA: Diagnosis not present

## 2023-08-07 DIAGNOSIS — E876 Hypokalemia: Secondary | ICD-10-CM | POA: Diagnosis present

## 2023-08-07 DIAGNOSIS — Z888 Allergy status to other drugs, medicaments and biological substances status: Secondary | ICD-10-CM

## 2023-08-07 DIAGNOSIS — J449 Chronic obstructive pulmonary disease, unspecified: Secondary | ICD-10-CM | POA: Diagnosis not present

## 2023-08-07 DIAGNOSIS — R0689 Other abnormalities of breathing: Secondary | ICD-10-CM | POA: Diagnosis not present

## 2023-08-07 DIAGNOSIS — N1832 Chronic kidney disease, stage 3b: Secondary | ICD-10-CM | POA: Diagnosis not present

## 2023-08-07 DIAGNOSIS — A419 Sepsis, unspecified organism: Secondary | ICD-10-CM | POA: Diagnosis not present

## 2023-08-07 DIAGNOSIS — Z1152 Encounter for screening for COVID-19: Secondary | ICD-10-CM

## 2023-08-07 DIAGNOSIS — J9601 Acute respiratory failure with hypoxia: Secondary | ICD-10-CM | POA: Diagnosis present

## 2023-08-07 DIAGNOSIS — Z85828 Personal history of other malignant neoplasm of skin: Secondary | ICD-10-CM | POA: Diagnosis not present

## 2023-08-07 DIAGNOSIS — J189 Pneumonia, unspecified organism: Secondary | ICD-10-CM | POA: Diagnosis not present

## 2023-08-07 DIAGNOSIS — Z8249 Family history of ischemic heart disease and other diseases of the circulatory system: Secondary | ICD-10-CM

## 2023-08-07 DIAGNOSIS — Z96611 Presence of right artificial shoulder joint: Secondary | ICD-10-CM | POA: Diagnosis not present

## 2023-08-07 DIAGNOSIS — Z9841 Cataract extraction status, right eye: Secondary | ICD-10-CM

## 2023-08-07 DIAGNOSIS — Z825 Family history of asthma and other chronic lower respiratory diseases: Secondary | ICD-10-CM

## 2023-08-07 DIAGNOSIS — I480 Paroxysmal atrial fibrillation: Secondary | ICD-10-CM | POA: Diagnosis present

## 2023-08-07 DIAGNOSIS — Z9842 Cataract extraction status, left eye: Secondary | ICD-10-CM

## 2023-08-07 DIAGNOSIS — Z79899 Other long term (current) drug therapy: Secondary | ICD-10-CM

## 2023-08-07 DIAGNOSIS — I340 Nonrheumatic mitral (valve) insufficiency: Secondary | ICD-10-CM | POA: Diagnosis present

## 2023-08-07 DIAGNOSIS — E1122 Type 2 diabetes mellitus with diabetic chronic kidney disease: Secondary | ICD-10-CM | POA: Diagnosis present

## 2023-08-07 DIAGNOSIS — Z9049 Acquired absence of other specified parts of digestive tract: Secondary | ICD-10-CM

## 2023-08-07 DIAGNOSIS — R509 Fever, unspecified: Secondary | ICD-10-CM

## 2023-08-07 DIAGNOSIS — I10 Essential (primary) hypertension: Secondary | ICD-10-CM | POA: Diagnosis present

## 2023-08-07 DIAGNOSIS — R0989 Other specified symptoms and signs involving the circulatory and respiratory systems: Secondary | ICD-10-CM | POA: Diagnosis not present

## 2023-08-07 DIAGNOSIS — I5030 Unspecified diastolic (congestive) heart failure: Secondary | ICD-10-CM | POA: Diagnosis not present

## 2023-08-07 DIAGNOSIS — I5043 Acute on chronic combined systolic (congestive) and diastolic (congestive) heart failure: Secondary | ICD-10-CM

## 2023-08-07 DIAGNOSIS — Z96653 Presence of artificial knee joint, bilateral: Secondary | ICD-10-CM | POA: Diagnosis present

## 2023-08-07 DIAGNOSIS — I509 Heart failure, unspecified: Secondary | ICD-10-CM | POA: Diagnosis not present

## 2023-08-07 DIAGNOSIS — I5033 Acute on chronic diastolic (congestive) heart failure: Secondary | ICD-10-CM | POA: Diagnosis not present

## 2023-08-07 DIAGNOSIS — N183 Chronic kidney disease, stage 3 unspecified: Secondary | ICD-10-CM | POA: Diagnosis not present

## 2023-08-07 DIAGNOSIS — I110 Hypertensive heart disease with heart failure: Secondary | ICD-10-CM | POA: Diagnosis not present

## 2023-08-07 DIAGNOSIS — Z961 Presence of intraocular lens: Secondary | ICD-10-CM | POA: Diagnosis present

## 2023-08-07 DIAGNOSIS — R0602 Shortness of breath: Secondary | ICD-10-CM | POA: Diagnosis not present

## 2023-08-07 DIAGNOSIS — I482 Chronic atrial fibrillation, unspecified: Secondary | ICD-10-CM | POA: Diagnosis not present

## 2023-08-07 DIAGNOSIS — I5041 Acute combined systolic (congestive) and diastolic (congestive) heart failure: Secondary | ICD-10-CM | POA: Diagnosis not present

## 2023-08-07 DIAGNOSIS — Z90710 Acquired absence of both cervix and uterus: Secondary | ICD-10-CM

## 2023-08-07 DIAGNOSIS — E669 Obesity, unspecified: Secondary | ICD-10-CM | POA: Diagnosis not present

## 2023-08-07 DIAGNOSIS — I495 Sick sinus syndrome: Secondary | ICD-10-CM | POA: Diagnosis present

## 2023-08-07 DIAGNOSIS — I429 Cardiomyopathy, unspecified: Secondary | ICD-10-CM | POA: Diagnosis present

## 2023-08-07 DIAGNOSIS — Z7984 Long term (current) use of oral hypoglycemic drugs: Secondary | ICD-10-CM | POA: Diagnosis not present

## 2023-08-07 DIAGNOSIS — Z471 Aftercare following joint replacement surgery: Secondary | ICD-10-CM | POA: Diagnosis not present

## 2023-08-07 DIAGNOSIS — I4891 Unspecified atrial fibrillation: Secondary | ICD-10-CM | POA: Diagnosis not present

## 2023-08-07 DIAGNOSIS — Z950 Presence of cardiac pacemaker: Secondary | ICD-10-CM

## 2023-08-07 DIAGNOSIS — R6520 Severe sepsis without septic shock: Secondary | ICD-10-CM | POA: Diagnosis not present

## 2023-08-07 DIAGNOSIS — I130 Hypertensive heart and chronic kidney disease with heart failure and stage 1 through stage 4 chronic kidney disease, or unspecified chronic kidney disease: Secondary | ICD-10-CM | POA: Diagnosis not present

## 2023-08-07 DIAGNOSIS — R197 Diarrhea, unspecified: Secondary | ICD-10-CM | POA: Diagnosis not present

## 2023-08-07 DIAGNOSIS — R918 Other nonspecific abnormal finding of lung field: Secondary | ICD-10-CM | POA: Diagnosis not present

## 2023-08-07 DIAGNOSIS — E1129 Type 2 diabetes mellitus with other diabetic kidney complication: Secondary | ICD-10-CM | POA: Diagnosis present

## 2023-08-07 DIAGNOSIS — Z6828 Body mass index (BMI) 28.0-28.9, adult: Secondary | ICD-10-CM

## 2023-08-07 DIAGNOSIS — Z96642 Presence of left artificial hip joint: Secondary | ICD-10-CM | POA: Diagnosis present

## 2023-08-07 DIAGNOSIS — I447 Left bundle-branch block, unspecified: Secondary | ICD-10-CM | POA: Diagnosis present

## 2023-08-07 LAB — URINALYSIS, W/ REFLEX TO CULTURE (INFECTION SUSPECTED)
Bilirubin Urine: NEGATIVE
Glucose, UA: NEGATIVE mg/dL
Hgb urine dipstick: NEGATIVE
Ketones, ur: NEGATIVE mg/dL
Leukocytes,Ua: NEGATIVE
Nitrite: NEGATIVE
Protein, ur: >=300 mg/dL — AB
Specific Gravity, Urine: 1.015 (ref 1.005–1.030)
pH: 5 (ref 5.0–8.0)

## 2023-08-07 LAB — COMPREHENSIVE METABOLIC PANEL
ALT: 41 U/L (ref 0–44)
AST: 42 U/L — ABNORMAL HIGH (ref 15–41)
Albumin: 3.3 g/dL — ABNORMAL LOW (ref 3.5–5.0)
Alkaline Phosphatase: 80 U/L (ref 38–126)
Anion gap: 12 (ref 5–15)
BUN: 43 mg/dL — ABNORMAL HIGH (ref 8–23)
CO2: 20 mmol/L — ABNORMAL LOW (ref 22–32)
Calcium: 8.3 mg/dL — ABNORMAL LOW (ref 8.9–10.3)
Chloride: 104 mmol/L (ref 98–111)
Creatinine, Ser: 2.07 mg/dL — ABNORMAL HIGH (ref 0.44–1.00)
GFR, Estimated: 23 mL/min — ABNORMAL LOW (ref >60)
Glucose, Bld: 189 mg/dL — ABNORMAL HIGH (ref 70–99)
Potassium: 3.9 mmol/L (ref 3.5–5.1)
Sodium: 136 mmol/L (ref 135–145)
Total Bilirubin: 1 mg/dL (ref 0.0–1.2)
Total Protein: 6 g/dL — ABNORMAL LOW (ref 6.5–8.1)

## 2023-08-07 LAB — RESP PANEL BY RT-PCR (RSV, FLU A&B, COVID)  RVPGX2
Influenza A by PCR: NEGATIVE
Influenza B by PCR: NEGATIVE
Resp Syncytial Virus by PCR: NEGATIVE
SARS Coronavirus 2 by RT PCR: NEGATIVE

## 2023-08-07 LAB — CBC WITH DIFFERENTIAL/PLATELET
Abs Immature Granulocytes: 0.02 10*3/uL (ref 0.00–0.07)
Basophils Absolute: 0 10*3/uL (ref 0.0–0.1)
Basophils Relative: 0 %
Eosinophils Absolute: 0 10*3/uL (ref 0.0–0.5)
Eosinophils Relative: 0 %
HCT: 38 % (ref 36.0–46.0)
Hemoglobin: 12.6 g/dL (ref 12.0–15.0)
Immature Granulocytes: 0 %
Lymphocytes Relative: 17 %
Lymphs Abs: 0.8 10*3/uL (ref 0.7–4.0)
MCH: 30.4 pg (ref 26.0–34.0)
MCHC: 33.2 g/dL (ref 30.0–36.0)
MCV: 91.6 fL (ref 80.0–100.0)
Monocytes Absolute: 0.3 10*3/uL (ref 0.1–1.0)
Monocytes Relative: 6 %
Neutro Abs: 3.7 10*3/uL (ref 1.7–7.7)
Neutrophils Relative %: 77 %
Platelets: 204 10*3/uL (ref 150–400)
RBC: 4.15 MIL/uL (ref 3.87–5.11)
RDW: 17.1 % — ABNORMAL HIGH (ref 11.5–15.5)
WBC: 4.9 10*3/uL (ref 4.0–10.5)
nRBC: 0 % (ref 0.0–0.2)

## 2023-08-07 LAB — BLOOD GAS, VENOUS
Acid-base deficit: 3.9 mmol/L — ABNORMAL HIGH (ref 0.0–2.0)
Bicarbonate: 22.2 mmol/L (ref 20.0–28.0)
O2 Saturation: 88.2 %
Patient temperature: 37
pCO2, Ven: 43 mm[Hg] — ABNORMAL LOW (ref 44–60)
pH, Ven: 7.32 (ref 7.25–7.43)
pO2, Ven: 55 mm[Hg] — ABNORMAL HIGH (ref 32–45)

## 2023-08-07 LAB — APTT: aPTT: 26 s (ref 24–36)

## 2023-08-07 LAB — STREP PNEUMONIAE URINARY ANTIGEN: Strep Pneumo Urinary Antigen: NEGATIVE

## 2023-08-07 LAB — TROPONIN I (HIGH SENSITIVITY)
Troponin I (High Sensitivity): 91 ng/L — ABNORMAL HIGH (ref <18)
Troponin I (High Sensitivity): 104 ng/L (ref <18)

## 2023-08-07 LAB — PROTIME-INR
INR: 1.1 (ref 0.8–1.2)
Prothrombin Time: 14.4 s (ref 11.4–15.2)

## 2023-08-07 LAB — CBG MONITORING, ED
Glucose-Capillary: 151 mg/dL — ABNORMAL HIGH (ref 70–99)
Glucose-Capillary: 115 mg/dL — ABNORMAL HIGH (ref 70–99)

## 2023-08-07 LAB — LACTIC ACID, PLASMA: Lactic Acid, Venous: 1.1 mmol/L (ref 0.5–1.9)

## 2023-08-07 LAB — BRAIN NATRIURETIC PEPTIDE: B Natriuretic Peptide: 4285 pg/mL — ABNORMAL HIGH (ref 0.0–100.0)

## 2023-08-07 MED ORDER — INSULIN ASPART 100 UNIT/ML IJ SOLN
0.0000 [IU] | Freq: Three times a day (TID) | INTRAMUSCULAR | Status: DC
Start: 2023-08-07 — End: 2023-08-11
  Administered 2023-08-07 – 2023-08-08 (×2): 2 [IU] via SUBCUTANEOUS
  Administered 2023-08-09 – 2023-08-10 (×2): 1 [IU] via SUBCUTANEOUS
  Filled 2023-08-07 (×4): qty 1

## 2023-08-07 MED ORDER — CARVEDILOL 3.125 MG PO TABS
3.1250 mg | ORAL_TABLET | Freq: Two times a day (BID) | ORAL | Status: DC
Start: 2023-08-07 — End: 2023-08-10
  Administered 2023-08-07 – 2023-08-10 (×6): 3.125 mg via ORAL
  Filled 2023-08-07 (×6): qty 1

## 2023-08-07 MED ORDER — AMIODARONE HCL 200 MG PO TABS
200.0000 mg | ORAL_TABLET | Freq: Every day | ORAL | Status: DC
  Administered 2023-08-07 – 2023-08-11 (×5): 200 mg via ORAL
  Filled 2023-08-07 (×5): qty 1

## 2023-08-07 MED ORDER — ONDANSETRON HCL 4 MG PO TABS: 4.0000 mg | ORAL_TABLET | Freq: Four times a day (QID) | ORAL | Status: DC | PRN

## 2023-08-07 MED ORDER — SODIUM CHLORIDE 0.9 % IV SOLN: 500.0000 mg | INTRAVENOUS | Status: DC

## 2023-08-07 MED ORDER — FUROSEMIDE 10 MG/ML IJ SOLN
20.0000 mg | Freq: Once | INTRAMUSCULAR | Status: AC
Start: 2023-08-07 — End: 2023-08-07
  Administered 2023-08-07: 20 mg via INTRAVENOUS
  Filled 2023-08-07: qty 4

## 2023-08-07 MED ORDER — FUROSEMIDE 40 MG PO TABS
40.0000 mg | ORAL_TABLET | Freq: Every day | ORAL | Status: DC
  Administered 2023-08-07: 40 mg via ORAL
  Filled 2023-08-07: qty 1

## 2023-08-07 MED ORDER — APIXABAN 2.5 MG PO TABS
2.5000 mg | ORAL_TABLET | Freq: Two times a day (BID) | ORAL | Status: DC
Start: 2023-08-07 — End: 2023-08-11
  Administered 2023-08-07 – 2023-08-11 (×8): 2.5 mg via ORAL
  Filled 2023-08-07 (×9): qty 1

## 2023-08-07 MED ORDER — ONDANSETRON HCL 4 MG/2ML IJ SOLN: 4.0000 mg | Freq: Four times a day (QID) | INTRAMUSCULAR | Status: DC | PRN

## 2023-08-07 MED ORDER — SODIUM CHLORIDE 0.9 % IV SOLN
500.0000 mg | Freq: Once | INTRAVENOUS | Status: AC
  Administered 2023-08-07: 500 mg via INTRAVENOUS
  Filled 2023-08-07: qty 5

## 2023-08-07 MED ORDER — ENOXAPARIN SODIUM 30 MG/0.3ML IJ SOSY: 30.0000 mg | PREFILLED_SYRINGE | INTRAMUSCULAR | Status: DC

## 2023-08-07 MED ORDER — LACTATED RINGERS IV BOLUS (SEPSIS): 500.0000 mL | Freq: Once | INTRAVENOUS | Status: DC

## 2023-08-07 MED ORDER — SODIUM CHLORIDE 0.9 % IV SOLN
2.0000 g | INTRAVENOUS | Status: DC
  Administered 2023-08-08: 2 g via INTRAVENOUS
  Filled 2023-08-07 (×2): qty 20

## 2023-08-07 MED ORDER — SODIUM CHLORIDE 0.9 % IV SOLN
2.0000 g | Freq: Once | INTRAVENOUS | Status: AC
  Administered 2023-08-07: 2 g via INTRAVENOUS
  Filled 2023-08-07: qty 20

## 2023-08-07 NOTE — Progress Notes (Signed)
 CODE SEPSIS - PHARMACY COMMUNICATION  **Broad Spectrum Antibiotics should be administered within 1 hour of Sepsis diagnosis**  Time Code Sepsis Called/Page Received: 1250  Antibiotics Ordered: azithro, ceftriaxone   Time of 1st antibiotic administration: 1302  Additional action taken by pharmacy: N/A  If necessary, Name of Provider/Nurse Contacted: N/A    Alice Innocent ,PharmD Clinical Pharmacist  08/07/2023  12:58 PM

## 2023-08-07 NOTE — ED Notes (Signed)
 Granddaughter (POA) called and given an update with pt's permission.

## 2023-08-07 NOTE — Assessment & Plan Note (Addendum)
On admission, pt was diagnosed with Pneumonia, Acute on chronic HFpEF. Decompensated resp failure requiring 2 L and cannula on presentation. Likely secondary to overlapping pneumonia as well as acute on chronic HFpEF. BNP 4285 which is well above baseline. Chest x-ray with CHF and left retrocardiac pneumonia. IV Lasix. IV Rocephin azithromycin for infectious coverage. Blood and respiratory cultures.Monitor respiratory status appropriately. 08-08-2023 pt has been weaned to RA.  Pt remains on IV lasix. Pt has normal WBC and no fevers.  Have ordered procalcitonin but just found out that lab machine for procalcitonin is broken. I do not strongly feel she has pneumonia given how fast her hypoxia has cleared. Will leave abx going for 1 more day. Hopefully procalcitonin lab will be able to be run tomorrow. I think her acute respiratory failure with hypoxia was due to pulmonary edema from acute on chronic diastolic CHF. 08-09-2023 hypoxia has resolved. Due to CHF, not pneumonia. Pneumonia has been rule out. Stop abx. 08-10-2023 diarrhea started in ER. Could be from abx that were started in ER. However, given recent outbreak of Nori virus, will check diarrhea panel.  08-11-2023 GI diarrhea panel negative. Likely diarrhea from short course of abx. Diarrhea has improved.

## 2023-08-07 NOTE — Assessment & Plan Note (Addendum)
 On admission, Appears fairly stable from a respiratory standpoint. Cont prn inhalers  08-08-2023 stable. Not exacerbated. 08-09-2023 stable.  08-10-2023 stable. 08-11-2023 stable. Continue home inhalers of Trelegy(100-62.5-25) 1 puff bid

## 2023-08-07 NOTE — Assessment & Plan Note (Addendum)
 On admission, Blood sugar 180s. SSI. A1C. Monitor  08-08-2023 will order A1C. CBG acceptable ranges. Continue with SSI. Add Jardiance for additional diuretic effect. Should also help with CBG. 08-09-2023 stable. CBG acceptable ranges. 08-10-2023 stable. CBG acceptable 08-11-2023 stable. Continue Jardiance

## 2023-08-07 NOTE — Assessment & Plan Note (Addendum)
 On admission, 2D echo July 2024 with EF 60 to 65% and grade 1 diastolic dysfunction. BNP 4285 today and CHF pattern. S/p 20mg  IV lasix  in the ER. Will acutely increase home lasix  20mg  daily-->40mg  daily. Weight today 88kg. Strict Is and Os and daily weights. Cardiology consult as clinically indicated.  08-08-2023 continue with lasix  40 mg IV q12h. Continue with coreg  3.125 mg bid.  Add Jardiance  10 mg qday.  Pt denies any hx of frequent UTIs. Monitor Scr. Was seen by outpt cardiology in 06-2023. No mention as to why pt is not on GDMT. Specifically not on Entresto .  Possibly due to CKD stage 3b. Will need referral back to cards clinic for them to decide if this is appropriate for patient. 08-09-2023 awaiting labs. Possible change to po lasix . 08-10-2023 lasix  stopped yesterday due to rising Scr. Pt looks euvolemic now. Only trace pretibial edema. LVEF decreased now to 25%. [facility] cards consulted today.  08-11-2023 pt now with combined systolic/diastolic CHF. See below

## 2023-08-07 NOTE — ED Triage Notes (Addendum)
 Pt to ED via [facility] from home for respiratory distress. Pt called out difficulty breathing x3 days. Pt has hx of CHF and per EMS pt has not taken lasix  in 3 days. Per EMS pt has fever of 101 and gave tylenol   prior to arrival. Per EMS initial RA sat 83%. Per EMS pt had initial Bp of 210 systolic. Pt arrives to ED on CPAP. Pt placed on bipap on arrival. Pt has pacemaker.   EMS vitals  HR 105  SPO2 93% Temp 101  EMS gave  2 inches nitroglycerin  paste 1000mg  tylenol   Analopril 1.25mg 

## 2023-08-07 NOTE — Assessment & Plan Note (Addendum)
 On admission, Rate controlled at present. Cont home regimen including amiodarone and eliquis  08-08-2023 stable. Continue coreg 3.125 mg bid, eliquis 5 mg bid, amiodarone 200 mg daily.  EKG on admission showed NSR. 08-09-2023 stable. 08-10-2023 stable.  08-11-2023. Has PPM for SSS. DC to home on coreg 6.125 mg bid, continue Eliquis 5 mg bid. Amiodarone 200 mg every day.

## 2023-08-07 NOTE — ED Provider Notes (Signed)
 [facility] Provider Note    Event Date/Time   First MD Initiated Contact with Patient 08/07/23 1240     (approximate)   History   Respiratory Distress   HPI  Leslie Parker is a 89 y.o. female patient is history of CHF, not oxygen, hypertension, paroxysmal A-fib on Eliquis , sick sinus syndrome with a pacemaker, presenting with shortness of breath for the last couple days.  States that she has also had a cough, some chest pain.  Has been off her Lasix  for 2 days.  Per EMS they found her satting in the low 80s, with increased work of breathing, started her on nonrebreather and then transition her to CPAP with improvement to her breathing.  Was found to also be febrile per EMS.  She denies any nausea, vomiting, diarrhea, back pain, abdominal pain.  On independent review she was admitted to the hospital for CHF exacerbation in November of last year.     Physical Exam   Triage Vital Signs: ED Triage Vitals  Encounter Vitals Group     BP 08/07/23 1240 (!) 176/93     Systolic BP Percentile --      Diastolic BP Percentile --      Pulse Rate 08/07/23 1240 79     Resp 08/07/23 1240 (!) 25     Temp --      Temp src --      SpO2 08/07/23 1240 95 %     Weight 08/07/23 1242 194 lb 8 oz (88.2 kg)     Height 08/07/23 1242 5\' 9"  (1.753 m)     Head Circumference --      Peak Flow --      Pain Score 08/07/23 1241 0     Pain Loc --      Pain Education --      Exclude from Growth Chart --     Most recent vital signs: Vitals:   08/07/23 1400 08/07/23 1500  BP: (!) 172/85 (!) 150/83  Pulse: 71 60  Resp: 20 (!) 22  Temp:    SpO2: 97% 93%     General: Awake, no distress.  CV:  Good peripheral perfusion.  Resp:  Normal effort.  Crackles at the bases, tachypneic Abd:  No distention.  Nontender Other:  Bilateral lower extremity edema appears symmetrical   ED Results / Procedures / Treatments   Labs (all labs ordered are listed, but only abnormal results  are displayed) Labs Reviewed  COMPREHENSIVE METABOLIC PANEL - Abnormal; Notable for the following components:      Result Value   CO2 20 (*)    Glucose, Bld 189 (*)    BUN 43 (*)    Creatinine, Ser 2.07 (*)    Calcium 8.3 (*)    Total Protein 6.0 (*)    Albumin  3.3 (*)    AST 42 (*)    GFR, Estimated 23 (*)    All other components within normal limits  CBC WITH DIFFERENTIAL/PLATELET - Abnormal; Notable for the following components:   RDW 17.1 (*)    All other components within normal limits  BLOOD GAS, VENOUS - Abnormal; Notable for the following components:   pCO2, Ven 43 (*)    pO2, Ven 55 (*)    Acid-base deficit 3.9 (*)    All other components within normal limits  BRAIN NATRIURETIC PEPTIDE - Abnormal; Notable for the following components:   B Natriuretic Peptide 4,285.0 (*)    All other  components within normal limits  RESP PANEL BY RT-PCR (RSV, FLU A&B, COVID)  RVPGX2  CULTURE, BLOOD (ROUTINE X 2)  CULTURE, BLOOD (ROUTINE X 2)  LACTIC ACID, PLASMA  PROTIME-INR  APTT  LACTIC ACID, PLASMA  URINALYSIS, W/ REFLEX TO CULTURE (INFECTION SUSPECTED)  TROPONIN I (HIGH SENSITIVITY)     EKG  Paced rhythm, rate 81, 1 and QRS, does not meet Sgarbossa's criteria, T wave inversion to 1, aVL, this appears changed compared to prior   RADIOLOGY On my interpretation, possible consolidation of the right lower lobe, bilateral pulmonary edema.  Radiology read is still pending   PROCEDURES:  Critical Care performed: Yes, see critical care procedure note(s)  .Critical Care  Performed by: Shane Darling, MD Authorized by: Shane Darling, MD   Critical care provider statement:    Critical care time (minutes):  45   Critical care was necessary to treat or prevent imminent or life-threatening deterioration of the following conditions:  Respiratory failure   Critical care was time spent personally by me on the following activities:  Development of treatment plan with patient or  surrogate, discussions with consultants, evaluation of patient's response to treatment, examination of patient, ordering and review of laboratory studies, ordering and review of radiographic studies, ordering and performing treatments and interventions, pulse oximetry, re-evaluation of patient's condition and review of old charts    MEDICATIONS ORDERED IN ED: Medications  furosemide  (LASIX ) injection 20 mg (has no administration in time range)  cefTRIAXone  (ROCEPHIN ) 2 g in sodium chloride  0.9 % 100 mL IVPB (0 g Intravenous Stopped 08/07/23 1357)  azithromycin  (ZITHROMAX ) 500 mg in sodium chloride  0.9 % 250 mL IVPB (0 mg Intravenous Stopped 08/07/23 1503)     IMPRESSION / MDM / ASSESSMENT AND PLAN / ED COURSE  I reviewed the triage vital signs and the nursing notes.                              Differential diagnosis includes, but is not limited to, CHF exacerbation, viral illness, pneumonia, influenza, RSV, COVID, ACS, electrolyte derangements, considered PE the patient has crackles at the bases, appears volume overloaded, more suspicious for CHF, PE is lower on the differential.  Patient's presentation is most consistent with acute presentation with potential threat to life or bodily function.  Patient with history of CHF, not on O2 presenting with shortness of breath, work of breathing and improved on CPAP, was transitioned to BiPAP here, on reassessment is improving now but still on 2 L O2 satting in the low 90s.  Given her temperature from before, she was antibiosis with IV ceftriaxone  and azithromycin .  Also given 20 mg of IV Lasix  for her overload.  Pending review of labs, her COVID, RSV, influenza is negative, her VBG pH is normal, BNP is elevated compared to prior, lactate is normal, her creatinine is elevated but this is downtrending compared to prior, electrolytes are not severely deranged.  She has no leukocytosis.  Given that patient is high risk and still on oxygen, she needs to be  admitted for further management.  Consult to hospice is agreeable plan for admission will evaluate the patient.  She is admitted.  Clinical Course as of 08/07/23 1540  Wed Aug 07, 2023  1250 Will hold off IV fluids for now since patient appears fluid overloaded and is on BiPAP.  She is also not hypotensive. [TT]  1405 Temp(!): 97.3 F (36.3 C) EMS reported  temp of 101 and gave her some Tylenol . [TT]  1513 Patient on BiPAP, satting 93% on 2 L, is not typically on oxygen at baseline. [TT]  1535 Consulted hospitalist was agreeable plan for admission and will evaluate the patient.  She is admitted. [TT]    Clinical Course User Index [TT] Drenda Gentle Richard Champion, MD     FINAL CLINICAL IMPRESSION(S) / ED DIAGNOSES   Final diagnoses:  Acute on chronic congestive heart failure, unspecified heart failure type (HCC)  Hypoxia  Fever, unspecified fever cause  Sepsis, due to unspecified organism, unspecified whether acute organ dysfunction present (HCC)  Pneumonia of right lung due to infectious organism, unspecified part of lung     Rx / DC Orders   ED Discharge Orders     None        Note:  This document was prepared using Dragon voice recognition software and may include unintentional dictation errors.    Shane Darling, MD 08/07/23 1540

## 2023-08-07 NOTE — Assessment & Plan Note (Addendum)
On admission, Cr 2 w/ GFR in 20s. Appears near baseline. Monitor  08-08-2023 Scr 1.84, BUN 39 with diuresis. Continue to monitor during diuresis. Baseline scr 1.8-2.0 08-09-2023 awaiting AM labs. 08-10-2023 Scr up to 1.95 with diuresis. Last dose of Lasix yesterday around 10 AM.  08-11-2023 Scr stable at 1.82, BUN 35. Cards wants to go to home on lasix 40 mg daily. Restart today.

## 2023-08-07 NOTE — H&P (Addendum)
 History and Physical    Patient: Leslie Parker AVW:[id number] DOB: [birth year] DOA: 08/07/2023 DOS: the patient was seen and examined on 08/07/2023 PCP: Rex Castor, MD  Patient coming from: Home  Chief Complaint:  Chief Complaint  Patient presents with   Respiratory Distress   HPI: Leslie Parker is a 89 y.o. female with medical history significant of chronic HFpEF, asthma, atrial fibrillation, stage III CKD type 2 diabetes, hypertension presented with acute respiratory failure with hypoxia, acute on chronic HFpEF and pneumonia.  Patient reports increased work of breathing over the past 1 to 2 days.  Positive cough.  Mild orthopnea PND.  No fevers or chills.  No chest pain.  No nausea or vomiting.  No focal hemiparesis or confusion.  Baseline medical issues including atrial fibrillation and type 2 diabetes.  No recent medication changes.  No reported recent sick contacts.  No reported alcohol or tobacco use. Presented to the ER afebrile, hemodynamically stable.  Satting in upper 80s on room air, transition to BiPAP and then ultimately 2 L nasal cannula to keep O2 sats greater than 93%.  White count 4.9, hemoglobin 12.6, platelets 204, VBG stable, creatinine 2.07, glucose 189, lactate 1.1.  COVID flu and RSV negative.  BNP of 4285 with baseline BNP around 1000.  Chest x-ray with CHF versus pulmonary edema as well as left retrocardiac pneumonia. EKG NSR.  Review of Systems: As mentioned in the history of present illness. All other systems reviewed and are negative. Past Medical History:  Diagnosis Date   Asthma    Atrial fibrillation with RVR (HCC)    Cancer (HCC)    Basal Cell   Diabetes mellitus without complication (HCC)    Heart murmur    Hypertension    Impetigo    Osteoarthritis    Osteopenia    Vomiting    can not due to surgery   Wears dentures    full upper and lower   Past Surgical History:  Procedure Laterality Date   ABDOMINAL HYSTERECTOMY     BACK SURGERY      BLADDER SURGERY     mesh   CARPAL TUNNEL RELEASE Bilateral    CATARACT EXTRACTION Right 2017   CATARACT EXTRACTION W/PHACO Left 02/06/2016   Procedure: CATARACT EXTRACTION PHACO AND INTRAOCULAR LENS PLACEMENT (IOC) left eye;  Surgeon: Billee Buddle, MD;  Location: [facility];  Service: Ophthalmology;  Laterality: Left;  DIABETIC LEFT Cannot arrive before 9:30   CERVICAL DISC SURGERY     CHOLECYSTECTOMY     EYE SURGERY Bilateral    Cataract Extraction with IOL   FEMUR IM NAIL Left 02/04/2015   Procedure: INTRAMEDULLARY (IM) NAIL FEMORAL;  Surgeon: Rande Bushy, MD;  Location: [facility];  Service: Orthopedics;  Laterality: Left;   HARDWARE REMOVAL Left 01/17/2017   Procedure: HARDWARE REMOVAL;  Surgeon: Rande Bushy, MD;  Location: [facility];  Service: Orthopedics;  Laterality: Left;   HERNIA REPAIR  2014   esophageal and gastric mesh. patient unable to throw up d/t mesh   HIP ARTHROPLASTY Left 01/26/2017   Procedure: ARTHROPLASTY BIPOLAR HIP (HEMIARTHROPLASTY) removal hardware left hip;  Surgeon: Marlynn Singer, MD;  Location: [facility];  Service: Orthopedics;  Laterality: Left;   INTRAMEDULLARY (IM) NAIL INTERTROCHANTERIC Right 02/13/2023   Procedure: INTRAMEDULLARY (IM) NAIL INTERTROCHANTERIC;  Surgeon: Elner Hahn, MD;  Location: [facility];  Service: Orthopedics;  Laterality: Right;   JOINT REPLACEMENT Bilateral    knees   PACEMAKER IMPLANT N/A 11/15/2022  Procedure: PACEMAKER IMPLANT;  Surgeon: Percival Brace, MD;  Location: [facility];  Service: Cardiovascular;  Laterality: N/A;   PACEMAKER IMPLANT N/A 11/28/2022   Procedure: PACEMAKER IMPLANT;  Surgeon: Percival Brace, MD;  Location: [facility];  Service: Cardiovascular;  Laterality: N/A;  Lead reposition   REPLACEMENT TOTAL KNEE BILATERAL Bilateral 8413,2440   SHOULDER ARTHROSCOPY WITH OPEN ROTATOR CUFF REPAIR AND DISTAL CLAVICLE ACROMINECTOMY Left 10/25/2016   Procedure: SHOULDER  ARTHROSCOPY WITH OPEN ROTATOR CUFF REPAIR AND DISTAL CLAVICLE ACROMINECTOMY;  Surgeon: Rande Bushy, MD;  Location: [facility];  Service: Orthopedics;  Laterality: Left;   THYROID  SURGERY     goiter removed   TOTAL HIP REVISION Left 12/02/2017   Procedure: TOTAL HIP REVISION;  Surgeon: Jerlyn Moons, MD;  Location: [facility];  Service: Orthopedics;  Laterality: Left;   TOTAL SHOULDER REPLACEMENT Right 2012   Social History:  reports that she has never smoked. She has never used smokeless tobacco. She reports that she does not drink alcohol and does not use drugs.  Allergies  Allergen Reactions   Baclofen Other (See Comments)    Elevated Cr   Simvastatin Other (See Comments)    Pt denies  Elevated LFTs with simva 40mg , stopped and resolved (see MD note 11/10/13)    Family History  Problem Relation Age of Onset   Heart failure Mother    Hypertension Mother    Emphysema Father     Prior to Admission medications   Medication Sig Start Date End Date Taking? Authorizing Provider  albuterol  (VENTOLIN  HFA) 108 (90 Base) MCG/ACT inhaler Inhale 2 puffs into the lungs every 6 (six) hours as needed. 08/04/19   [provider]  amiodarone  (PACERONE ) 200 MG tablet Take 200 mg by mouth daily.    [provider]  apixaban  (ELIQUIS ) 2.5 MG TABS tablet Take 1 tablet (2.5 mg total) by mouth 2 (two) times daily. 06/07/22   Tiajuana Fluke, MD  ascorbic acid  (VITAMIN C ) 250 MG CHEW Chew 250 mg by mouth daily.    [provider]  carvedilol  (COREG ) 3.125 MG tablet Take 1 tablet (3.125 mg total) by mouth 2 (two) times daily with a meal. 06/19/23   Ezzard Holms, MD  Cholecalciferol  (VITAMIN D -1000 MAX ST) 25 MCG (1000 UT) tablet Take 1,000 Units by mouth daily.    [provider]  colestipol  (COLESTID ) 1 g tablet Take 1 tablet by mouth 2 (two) times daily. 06/01/22   [provider]  Cyanocobalamin  (B-12) 2500 MCG TABS Take 2,500 mcg by mouth daily.     [provider]  diltiazem  (CARDIZEM  CD) 360 MG 24 hr capsule Take 360 mg by mouth daily. 11/23/22   [provider]  furosemide  (LASIX ) 20 MG tablet Take 1 tablet (20 mg total) by mouth daily. 06/07/23   Amin, Sumayya, MD  glipiZIDE  (GLUCOTROL  XL) 2.5 MG 24 hr tablet Take 2.5 mg by mouth daily with breakfast. 03/27/22   [provider]  hydrALAZINE  (APRESOLINE ) 25 MG tablet Take 25 mg by mouth 3 (three) times daily as needed. Take 1 tablet (25 mg total) by mouth 3 (three) times daily as needed (for SBP >150 conistently or DBP>100 persistently) 05/31/23 05/30/24  [provider]  ipratropium-albuterol  (DUONEB) 0.5-2.5 (3) MG/3ML SOLN Inhale 3 mLs into the lungs every 6 (six) hours as needed. 06/01/22   [provider]  isosorbide  mononitrate (IMDUR ) 30 MG 24 hr tablet Take 1 tablet (30 mg total) by mouth daily. 06/20/23  Ezzard Holms, MD  Magnesium  Oxide 500 MG CAPS Take 500 mg by mouth daily.    [provider]  Multiple Vitamin (MULTIVITAMIN WITH MINERALS) TABS tablet Take 1 tablet by mouth daily. One-A-Day Women's Vitamin    [provider]  omeprazole (PRILOSEC) 20 MG capsule Take 20 mg by mouth every morning. 01/10/23   [provider]  ondansetron  (ZOFRAN -ODT) 4 MG disintegrating tablet Take 4 mg by mouth every 8 (eight) hours as needed. 05/21/23   [provider]  traZODone  (DESYREL ) 100 MG tablet Take 100 mg by mouth at bedtime.    [provider]  TRELEGY ELLIPTA 100-62.5-25 MCG/ACT AEPB Inhale 1 puff into the lungs daily. 08/03/22   [provider]    Physical Exam: Vitals:   08/07/23 1337 08/07/23 1400 08/07/23 1500 08/07/23 1600  BP:  (!) 172/85 (!) 150/83 (!) 164/80  Pulse:  71 60 62  Resp:  20 (!) 22 (!) 24  Temp: (!) 97.3 F (36.3 C)     TempSrc: Axillary     SpO2:  97% 93% 95%  Weight:      Height:       Physical Exam Constitutional:      Appearance: She is obese.  HENT:      Head: Normocephalic and atraumatic.     Nose: Nose normal.  Eyes:     Pupils: Pupils are equal, round, and reactive to light.  Cardiovascular:     Rate and Rhythm: Normal rate and regular rhythm.  Pulmonary:     Effort: Pulmonary effort is normal.  Abdominal:     General: Bowel sounds are normal.  Musculoskeletal:        General: Normal range of motion.  Skin:    General: Skin is warm.  Neurological:     General: No focal deficit present.  Psychiatric:        Mood and Affect: Mood normal.     Data Reviewed:  There are no new results to review at this time.  DG Chest Port 1 View CLINICAL DATA:  [id number] Sepsis [facility]) [id number]. Respiratory distress. Difficulty breathing.  EXAM: PORTABLE CHEST 1 VIEW  COMPARISON:  06/16/2023.  FINDINGS: Low lung volume. Redemonstration of left retrocardiac airspace opacity obscuring the left hemidiaphragm, descending thoracic aorta and blunting the left lateral costophrenic angle, suggesting combination of left lung atelectasis and/or consolidation with pleural effusion. There is interval worsening since the prior study. There is also probable small layering right pleural effusion. There is mild diffuse pulmonary vascular congestion. No pneumothorax.  Stable cardio-mediastinal silhouette.  There is a left sided 2-lead pacemaker.  No acute osseous abnormalities.  Right reverse shoulder arthroplasty noted.  The soft tissues are within normal limits.  IMPRESSION: *Findings favor congestive heart failure/pulmonary edema. *There is worsening left retrocardiac opacity, as described above.  Electronically Signed   By: Beula Brunswick M.D.   On: 08/07/2023 15:40  Lab Results  Component Value Date   WBC 4.9 08/07/2023   HGB 12.6 08/07/2023   HCT 38.0 08/07/2023   MCV 91.6 08/07/2023   PLT 204 08/07/2023   Last metabolic panel Lab Results  Component Value Date   GLUCOSE 189 (H) 08/07/2023   NA 136 08/07/2023   K 3.9 08/07/2023    CL 104 08/07/2023   CO2 20 (L) 08/07/2023   BUN 43 (H) 08/07/2023   CREATININE 2.07 (H) 08/07/2023   GFRNONAA 23 (L) 08/07/2023   CALCIUM 8.3 (L) 08/07/2023   PROT 6.0 (L) 08/07/2023  ALBUMIN  3.3 (L) 08/07/2023   BILITOT 1.0 08/07/2023   ALKPHOS 80 08/07/2023   AST 42 (H) 08/07/2023   ALT 41 08/07/2023   ANIONGAP 12 08/07/2023    Assessment and Plan: * Acute respiratory failure with hypoxia (HCC) Pneumonia  Acute on chronic HFpEF  Decompensated resp failure requiring 2 L and cannula on presentation Likely secondary to overlapping pneumonia as well as acute on chronic HFpEF BNP 4285 which is well above baseline Chest x-ray with CHF and left retrocardiac pneumonia IV Lasix  IV Rocephin  azithromycin  for infectious coverage Blood and respiratory cultures Monitor respiratory status appropriately  COPD (chronic obstructive pulmonary disease) (HCC) Appears fairly stable from a respiratory standpoint Cont prn inhalers    Acute on chronic diastolic CHF (congestive heart failure) (HCC) 2D echo July 2024 with EF 60 to 65% and grade 1 diastolic dysfunction BNP 4285 today and CHF pattern S/p 20mg  IV lasix  in the ER  Will acutely increase home lasix  20mg  daily-->40mg  daily  Weight today 88kg  Strict Is and Os and daily weights  Cardiology consult as clinically indicated  Monitor   Chronic a-fib (HCC) Rate controlled at present Cont home regimen including amiodarone  and eliquis     Type II diabetes mellitus with renal manifestations (HCC) Blood sugar 180s  SSI  A1C  Monitor    Stage 3b chronic kidney disease (HCC) Cr 2 w/ GFR in 20s  Appears near baseline  Monitor       Advance Care Planning:   Code Status: Full Code   Consults: None at present- cardiology as clinically appropriate   Family Communication: No family at the bedside   Severity of Illness: The appropriate patient status for this patient is INPATIENT. Inpatient status is judged to be reasonable  and necessary in order to provide the required intensity of service to ensure the patient's safety. The patient's presenting symptoms, physical exam findings, and initial radiographic and laboratory data in the context of their chronic comorbidities is felt to place them at high risk for further clinical deterioration. Furthermore, it is not anticipated that the patient will be medically stable for discharge from the hospital within 2 midnights of admission.   * I certify that at the point of admission it is my clinical judgment that the patient will require inpatient hospital care spanning beyond 2 midnights from the point of admission due to high intensity of service, high risk for further deterioration and high frequency of surveillance required.*  Author: Corrinne Din, MD 08/07/2023 4:58 PM  For on call review [email or web address].

## 2023-08-08 ENCOUNTER — Encounter: Payer: Self-pay | Admitting: Family Medicine

## 2023-08-08 ENCOUNTER — Telehealth (HOSPITAL_COMMUNITY): Payer: Self-pay | Admitting: Pharmacy Technician

## 2023-08-08 ENCOUNTER — Other Ambulatory Visit (HOSPITAL_COMMUNITY): Payer: Self-pay

## 2023-08-08 DIAGNOSIS — N1832 Chronic kidney disease, stage 3b: Secondary | ICD-10-CM

## 2023-08-08 DIAGNOSIS — I5033 Acute on chronic diastolic (congestive) heart failure: Secondary | ICD-10-CM | POA: Diagnosis not present

## 2023-08-08 DIAGNOSIS — J9601 Acute respiratory failure with hypoxia: Secondary | ICD-10-CM | POA: Diagnosis not present

## 2023-08-08 DIAGNOSIS — I482 Chronic atrial fibrillation, unspecified: Secondary | ICD-10-CM | POA: Diagnosis not present

## 2023-08-08 DIAGNOSIS — E1122 Type 2 diabetes mellitus with diabetic chronic kidney disease: Secondary | ICD-10-CM

## 2023-08-08 DIAGNOSIS — J449 Chronic obstructive pulmonary disease, unspecified: Secondary | ICD-10-CM

## 2023-08-08 LAB — CBC
HCT: 35.8 % — ABNORMAL LOW (ref 36.0–46.0)
Hemoglobin: 11 g/dL — ABNORMAL LOW (ref 12.0–15.0)
MCH: 29.6 pg (ref 26.0–34.0)
MCHC: 30.7 g/dL (ref 30.0–36.0)
MCV: 96.5 fL (ref 80.0–100.0)
Platelets: 182 10*3/uL (ref 150–400)
RBC: 3.71 MIL/uL — ABNORMAL LOW (ref 3.87–5.11)
RDW: 17.2 % — ABNORMAL HIGH (ref 11.5–15.5)
WBC: 4.6 10*3/uL (ref 4.0–10.5)
nRBC: 0 % (ref 0.0–0.2)

## 2023-08-08 LAB — COMPREHENSIVE METABOLIC PANEL
ALT: 28 U/L (ref 0–44)
AST: 27 U/L (ref 15–41)
Albumin: 2.5 g/dL — ABNORMAL LOW (ref 3.5–5.0)
Alkaline Phosphatase: 63 U/L (ref 38–126)
Anion gap: 12 (ref 5–15)
BUN: 39 mg/dL — ABNORMAL HIGH (ref 8–23)
CO2: 19 mmol/L — ABNORMAL LOW (ref 22–32)
Calcium: 7.8 mg/dL — ABNORMAL LOW (ref 8.9–10.3)
Chloride: 106 mmol/L (ref 98–111)
Creatinine, Ser: 1.84 mg/dL — ABNORMAL HIGH (ref 0.44–1.00)
GFR, Estimated: 26 mL/min — ABNORMAL LOW (ref >60)
Glucose, Bld: 111 mg/dL — ABNORMAL HIGH (ref 70–99)
Potassium: 3.5 mmol/L (ref 3.5–5.1)
Sodium: 137 mmol/L (ref 135–145)
Total Bilirubin: 1 mg/dL (ref 0.0–1.2)
Total Protein: 4.9 g/dL — ABNORMAL LOW (ref 6.5–8.1)

## 2023-08-08 LAB — PROCALCITONIN: Procalcitonin: <0.1 ng/mL

## 2023-08-08 LAB — HEMOGLOBIN A1C
Hgb A1c MFr Bld: 7 % — ABNORMAL HIGH (ref 4.8–5.6)
Mean Plasma Glucose: 154.2 mg/dL

## 2023-08-08 LAB — CBG MONITORING, ED
Glucose-Capillary: 109 mg/dL — ABNORMAL HIGH (ref 70–99)
Glucose-Capillary: 167 mg/dL — ABNORMAL HIGH (ref 70–99)
Glucose-Capillary: 100 mg/dL — ABNORMAL HIGH (ref 70–99)

## 2023-08-08 MED ORDER — AZITHROMYCIN 500 MG PO TABS: 500.0000 mg | ORAL_TABLET | Freq: Every day | ORAL | Status: DC

## 2023-08-08 MED ORDER — DOXYCYCLINE HYCLATE 100 MG PO TABS
100.0000 mg | ORAL_TABLET | Freq: Two times a day (BID) | ORAL | Status: DC
  Administered 2023-08-08 (×2): 100 mg via ORAL
  Filled 2023-08-08 (×2): qty 1

## 2023-08-08 MED ORDER — EMPAGLIFLOZIN 10 MG PO TABS
10.0000 mg | ORAL_TABLET | Freq: Every day | ORAL | Status: DC
  Administered 2023-08-08 – 2023-08-11 (×4): 10 mg via ORAL
  Filled 2023-08-08 (×5): qty 1

## 2023-08-08 MED ORDER — FUROSEMIDE 10 MG/ML IJ SOLN
40.0000 mg | Freq: Two times a day (BID) | INTRAMUSCULAR | Status: DC
  Administered 2023-08-08 – 2023-08-09 (×3): 40 mg via INTRAVENOUS
  Filled 2023-08-08 (×3): qty 4

## 2023-08-08 MED ORDER — DAPAGLIFLOZIN PROPANEDIOL 10 MG PO TABS: 10.0000 mg | ORAL_TABLET | Freq: Every day | ORAL | Status: DC

## 2023-08-08 NOTE — Telephone Encounter (Addendum)
Patient Product/process development scientist completed.    The patient is insured through [facility] [facility]. Patient has Medicare and is not eligible for a copay card, but may be able to apply for patient assistance or Medicare RX Payment Plan (Patient Must reach out to their plan, if eligible for payment plan), if available.    Ran test claim for Jardiance 10 mg and the current 30 day co-pay is $47.00.  Ran test claim for Farxiga 10 mg and the current 30 day co-pay is $253.82.  This test claim was processed through [facility]- copay amounts may vary at other pharmacies due to pharmacy/plan contracts, or as the patient moves through the different stages of their insurance plan.     Roland Earl, CPHT Pharmacy Technician III Certified Patient Advocate [facility] Patient Advocate Team Direct Number: [phone]  Fax: [phone]

## 2023-08-08 NOTE — Evaluation (Signed)
Physical Therapy Evaluation Patient Details Name: Leslie Parker MRN: [id number] DOB: 1933-04-22 Today's Date: 08/08/2023  History of Present Illness  89 y/o female presented to ED on 08/07/23 with worsening SOB x1-2 days and some chest pain. She has been off her Lasix for 2 days. Admitted with acute respiratory failure with hypoxia, acute on chronic HFpEF, and pneumonia. PMH: sick sinus syndrome s/p pacemaker placement 11/15/22, HTN, Afib, DM, CKD stage 3, COPD, chronic HFpEF, asthma.  Clinical Impression  Pt was pleasant and able to participate relatively well with PT exam and showed ability to ambulate ~32ft with safe and relatively confident effort.  Pt states that she has help from her family as needed.  Pt will benefit from continued PT to address functional limitations.        If plan is discharge home, recommend the following: Assist for transportation   Can travel by private vehicle        Equipment Recommendations None recommended by PT  Recommendations for Other Services       Functional Status Assessment Patient has had a recent decline in their functional status and demonstrates the ability to make significant improvements in function in a reasonable and predictable amount of time.     Precautions / Restrictions Precautions Precautions: Fall Restrictions Weight Bearing Restrictions Per Provider Order: No      Mobility  Bed Mobility Overal bed mobility: Needs Assistance Bed Mobility: Supine to Sit, Sit to Supine     Supine to sit: Supervision     General bed mobility comments: Able to get to EOB with heavy UE use but w/o assist, sitting EOB with breakfast end of session    Transfers Overall transfer level: Needs assistance Equipment used: Rolling walker (2 wheels) Transfers: Sit to/from Stand Sit to Stand: Supervision           General transfer comment: Pt able to rise to standing w/o assist, UE and AD use with good confidence, no safety concerns     Ambulation/Gait Ambulation/Gait assistance: Supervision Gait Distance (Feet): 75 Feet Assistive device: Rolling walker (2 wheels)         General Gait Details: multiple loops in ED room, slow but steady with appropriate AD use  Stairs            Wheelchair Mobility     Tilt Bed    Modified Rankin (Stroke Patients Only)       Balance Overall balance assessment: Needs assistance Sitting-balance support: No upper extremity supported, Feet supported Sitting balance-[name]: Good     Standing balance support: Bilateral upper extremity supported Standing balance-[name]: Good Standing balance comment: using walker, no LOBs                             Pertinent Vitals/Pain Pain Assessment Pain Assessment: No/denies pain    Home Living Family/patient expects to be discharged to:: Private residence Living Arrangements: Alone Available Help at Discharge: Family;Available PRN/intermittently Type of Home: Apartment Home Access: Level entry       Home Layout: One level Home Equipment: Rollator (4 wheels);Cane - single point;Grab bars - tub/shower;Grab bars - toilet;Lift chair;Transport chair;Tub bench;Rolling Walker (2 wheels);Shower seat Additional Comments: Senior living apartment    Prior Function Prior Level of Function : Needs assist;History of Falls (last six months)             Mobility Comments: 1 fall in July; rollator at baseline, family assists with leaving  home using transport w/c ADLs Comments: Seated shower, mod indep with dressing, indep with meals, meds. Family assists with groceries, driving, trash, and mail     Extremity/Trunk Assessment   Upper Extremity Assessment Upper Extremity Assessment: Overall WFL for tasks assessed;Generalized weakness    Lower Extremity Assessment Lower Extremity Assessment: Overall WFL for tasks assessed;Generalized weakness    Cervical / Trunk Assessment Cervical / Trunk Assessment:  Normal  Communication   Communication Communication: No apparent difficulties  Cognition Arousal: Alert Behavior During Therapy: WFL for tasks assessed/performed Overall Cognitive Status: Within Functional Limits for tasks assessed                                          General Comments General comments (skin integrity, edema, etc.): Pt reports feeling a little off her baseline but confident she can return home at d/c    Exercises     Assessment/Plan    PT Assessment Patient needs continued PT services  PT Problem List Decreased activity tolerance;Decreased mobility;Decreased safety awareness;Decreased knowledge of use of DME;Decreased strength;Decreased range of motion;Decreased balance       PT Treatment Interventions DME instruction;Gait training;Functional mobility training;Therapeutic activities;Therapeutic exercise;Balance training;Patient/family education    PT Goals (Current goals can be found in the Care Plan section)  Acute Rehab PT Goals Patient Stated Goal: go home PT Goal Formulation: With patient Time For Goal Achievement: 08/21/23 Potential to Achieve Goals: Good    Frequency Min 1X/week     Co-evaluation               AM-PAC PT "6 Clicks" Mobility  Outcome Measure Help needed turning from your back to your side while in a flat bed without using bedrails?: None Help needed moving from lying on your back to sitting on the side of a flat bed without using bedrails?: None Help needed moving to and from a bed to a chair (including a wheelchair)?: A Little Help needed standing up from a chair using your arms (e.g., wheelchair or bedside chair)?: A Little Help needed to walk in hospital room?: A Little Help needed climbing 3-5 steps with a railing? : A Little 6 Click Score: 20    End of Session Equipment Utilized During Treatment: Gait belt Activity Tolerance: Patient tolerated treatment well Patient left: with call bell/phone  within reach;in bed Nurse Communication: Mobility status PT Visit Diagnosis: Muscle weakness (generalized) (M62.81);Difficulty in walking, not elsewhere classified (R26.2)    Time: 8119-1478 PT Time Calculation (min) (ACUTE ONLY): 26 min   Charges:   PT Evaluation $PT Eval Low Complexity: 1 Low PT Treatments $Gait Training: 8-22 mins PT General Charges $$ ACUTE PT VISIT: 1 Visit         Malachi Pro, DPT 08/08/2023, 12:36 PM

## 2023-08-08 NOTE — Evaluation (Signed)
Occupational Therapy Evaluation Patient Details Name: Leslie Parker MRN: [id number] DOB: Feb 04, 1933 Today's Date: 08/08/2023   History of Present Illness 89 y/o female presented to ED on 08/07/23 with worsening SOB x1-2 days and some chest pain. She has been off her Lasix for 2 days. Admitted with acute respiratory failure with hypoxia, acute on chronic HFpEF, and pneumonia. PMH: sick sinus syndrome s/p pacemaker placement 11/15/22, HTN, Afib, DM, CKD stage 3, COPD, chronic HFpEF, asthma.   Clinical Impression   Pt was seen for OT evaluation this date. Prior to hospital admission, pt was living in a senior living handicap accessible apartment with no STE. Pt notes she recently was able to get the tub/shower replaced with a walk in shower which she uses sitting on shower chair. Pt presents to acute OT demonstrating impaired ADL performance and functional mobility 2/2 decreased strength, activity tolerance, balance, and increased SOB with limited exertion (See OT problem list for additional functional deficits). Pt currently requires SBA for ADL transfers and mobility with RW (short distances before fatigues), PRN MIN A for LB ADL tasks. Pt educated in home/routines modifications and facilitated problem solving of strategies to improve her self mgt of fluid retention/edema in BLE in order to maximize safety. Pt verbalized understanding. Pt would benefit from skilled OT services to address noted impairments and functional limitations (see below for any additional details) in order to maximize safety and independence while minimizing falls risk and caregiver burden.       If plan is discharge home, recommend the following: A little help with walking and/or transfers;A little help with bathing/dressing/bathroom;Assistance with cooking/housework;Assist for transportation    Functional Status Assessment  Patient has had a recent decline in their functional status and demonstrates the ability to make  significant improvements in function in a reasonable and predictable amount of time.  Equipment Recommendations  None recommended by OT    Recommendations for Other Services       Precautions / Restrictions Precautions Precautions: Fall Restrictions Weight Bearing Restrictions Per Provider Order: No      Mobility Bed Mobility Overal bed mobility: Needs Assistance Bed Mobility: Supine to Sit, Sit to Supine     Supine to sit: Supervision, HOB elevated Sit to supine: Min assist   General bed mobility comments: MIN A for BLE mgt back to tall stretcher    Transfers Overall transfer level: Needs assistance Equipment used: Rolling walker (2 wheels) Transfers: Sit to/from Stand Sit to Stand: Supervision                  Balance Overall balance assessment: Needs assistance Sitting-balance support: No upper extremity supported, Feet supported Sitting balance-[name]: Good     Standing balance support: Bilateral upper extremity supported Standing balance-[name]: Fair                             ADL either performed or assessed with clinical judgement   ADL                                         General ADL Comments: Pt currently requiring PRN MIN A for LB ADL tasks, Supv-SBA for toilet transfers using RW.     Vision         Perception         Praxis  Pertinent Vitals/Pain Pain Assessment Pain Assessment: No/denies pain     Extremity/Trunk Assessment Upper Extremity Assessment Upper Extremity Assessment: Generalized weakness (hx bilat shoulder replacements, shoulder flexion limited to ~90*)   Lower Extremity Assessment Lower Extremity Assessment: Generalized weakness;Defer to PT evaluation   Cervical / Trunk Assessment Cervical / Trunk Assessment: Normal   Communication Communication Communication: No apparent difficulties   Cognition Arousal: Alert Behavior During Therapy: WFL for tasks  assessed/performed Overall Cognitive Status: Within Functional Limits for tasks assessed                                       General Comments  On RA, SpO2 94-97% at rest, 90-94% with exertion    Exercises Other Exercises Other Exercises: pt educated in home/routines modifications and facilitated problem solving of strategies to improve her self mgt of fluid retention/edema in BLE in order to maximize safety. Other Exercises: Pt ambulated ~25' with RW in the room with SBA, endorsed 6/10 perceived rate of exertion.   Shoulder Instructions      Home Living Family/patient expects to be discharged to:: Private residence Living Arrangements: Alone Available Help at Discharge: Family;Available PRN/intermittently Type of Home: Apartment Home Access: Level entry     Home Layout: One level     Bathroom Shower/Tub: Walk-in shower (pt notes recently was able to get tub replaced with walk in shower)   Bathroom Toilet: Handicapped height Bathroom Accessibility: Yes How Accessible: Accessible via walker Home Equipment: Rollator (4 wheels);Cane - single point;Grab bars - tub/shower;Grab bars - toilet;Lift chair;Transport chair;Tub bench;Rolling Walker (2 wheels);Shower seat   Additional Comments: Senior living apartment      Prior Functioning/Environment Prior Level of Function : Needs assist;History of Falls (last six months)             Mobility Comments: 1 fall in July; rollator at baseline, family assists with leaving home using transport w/c ADLs Comments: Seated shower, mod indep with dressing, indep with meals, meds. Family assists with groceries, driving, trash, and mail        OT Problem List: Decreased strength;Decreased activity tolerance;Decreased knowledge of use of DME or AE;Impaired balance (sitting and/or standing);Cardiopulmonary status limiting activity;Increased edema      OT Treatment/Interventions: Self-care/ADL training;Therapeutic  exercise;Therapeutic activities;DME and/or AE instruction;Energy conservation;Patient/family education;Balance training    OT Goals(Current goals can be found in the care plan section) Acute Rehab OT Goals Patient Stated Goal: get better and go home OT Goal Formulation: With patient Time For Goal Achievement: 08/22/23 Potential to Achieve Goals: Good ADL Goals Pt Will Perform Lower Body Dressing: with modified independence;sit to/from stand Pt Will Transfer to Toilet: with modified independence;ambulating (LRAD) Pt Will Perform Toileting - Clothing Manipulation and hygiene: with modified independence Additional ADL Goal #1: Pt will verbalize plan to implement at least 1 learned chronic disease mgt strategy to improve her self mgt of fluid retention.  OT Frequency: Min 1X/week    Co-evaluation              AM-PAC OT "6 Clicks" Daily Activity     Outcome Measure Help from another person eating meals?: None Help from another person taking care of personal grooming?: None Help from another person toileting, which includes using toliet, bedpan, or urinal?: A Little Help from another person bathing (including washing, rinsing, drying)?: A Little Help from another person to put on and taking off regular upper body clothing?:  None Help from another person to put on and taking off regular lower body clothing?: A Little 6 Click Score: 21   End of Session Equipment Utilized During Treatment: Rolling walker (2 wheels)  Activity Tolerance: Patient tolerated treatment well Patient left: in bed;with call bell/phone within reach;with nursing/sitter in room  OT Visit Diagnosis: Other abnormalities of gait and mobility (R26.89);Muscle weakness (generalized) (M62.81)                Time: 7829-5621 OT Time Calculation (min): 20 min Charges:  OT General Charges $OT Visit: 1 Visit OT Evaluation $OT Eval Low Complexity: 1 Low OT Treatments $Self Care/Home Management : 8-22 mins  Arman Filter.,  MPH, MS, OTR/L ascom [phone] 08/08/23, 10:53 AM

## 2023-08-08 NOTE — ED Notes (Signed)
Pt spo2 dropped to 88% sustained. RN placed pt on 2L [place] SPO2 95% at this time.

## 2023-08-08 NOTE — Subjective & Objective (Addendum)
Pt seen and examined. Remains on RA WBC 4.6. afebrile. No cough. LE edema has improved.

## 2023-08-08 NOTE — TOC Initial Note (Signed)
Transition of Care [facility]) - Initial/Assessment Note    Patient Details  Name: Leslie Parker MRN: [id number] Date of Birth: January 21, 1933  [facility]) CM/SW Contact:    Tory Emerald, LCSW Phone Number: 08/08/2023, 11:45 AM  Clinical Narrative:                  CSW completed High Risk Readmission assessment w/ pt's Grand daughter, HCPOA.   Admitted WUJ:WJXBJ Failure  Admitted from: Home  PCP: Dr. Nemiah Commander  [facility] Current home health/prior home health/DME: [facility] w/ [facility] and [facility]   Pt's Granddaughter would like pt to continue services w/ [facility], once discharged from hospital.   [facility] will continue to follow    Expected Discharge Plan: Home w Home Health Services Barriers to Discharge: Continued Medical Work up   Patient Goals and CMS Choice   CMS Medicare.gov Compare Post Acute Care list provided to:: Patient Represenative (must comment) (Pt's granddaughter) Choice offered to / list presented to : [facility] POA / Guardian (POA- Granddaughter)      Expected Discharge Plan and Services       Living arrangements for the past 2 months: Single Family Home                                      Prior Living Arrangements/Services Living arrangements for the past 2 months: Single Family Home   Patient language and need for interpreter reviewed:: Yes Do you feel safe going back to the place where you live?: Yes      Need for Family Participation in Patient Care: Yes (Comment) Care giver support system in place?: Yes (comment) Current home services: DME, Home PT Criminal Activity/Legal Involvement Pertinent to Current Situation/Hospitalization: No - Comment as needed  Activities of Daily Living      Permission Sought/Granted   Permission granted to share information with : Yes, Verbal Permission Granted     Permission granted to share info w AGENCY: [facility]        Emotional Assessment         Alcohol / Substance Use: Not  Applicable Psych Involvement: No (comment)  Admission diagnosis:  Acute respiratory failure with hypoxia (HCC) [J96.01] Patient Active Problem List   Diagnosis Date Noted   Overweight (BMI 25.0-29.9) 05/01/2023   Left hip pain 05/01/2023   Anemia 03/21/2023   Abnormal LFTs 03/20/2023   Type II diabetes mellitus with renal manifestations (HCC) 02/13/2023   Chronic diastolic CHF (congestive heart failure) (HCC) 02/13/2023   CKD stage 3 due to type 2 diabetes mellitus (HCC) 11/24/2022   Paroxysmal atrial fibrillation (HCC) 11/24/2022   Sick sinus syndrome (HCC) 11/15/2022   Acute on chronic diastolic CHF (congestive heart failure) (HCC) 08/27/2022   COPD (chronic obstructive pulmonary disease) (HCC) 08/23/2022   Chronic a-fib (HCC) 08/22/2022   Diabetes mellitus without complication (HCC)    Stage 3b chronic kidney disease (HCC)    Postural dizziness with presyncope    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 [place] [address] Phone: [phone] Fax: [phone]  [id number] [place], [place] - [address] [place] [address] Phone: [phone] Fax: [phone]     Social Drivers of Health (SDOH) Social History: SDOH Screenings   Food Insecurity: No Food Insecurity (06/17/2023)  Housing: Low Risk  (06/17/2023)  Transportation Needs: No Transportation Needs (06/17/2023)  Utilities: Not At Risk (06/17/2023)  Financial Resource Strain: Low Risk  (06/06/2023)  Tobacco Use: Low Risk  (08/07/2023)   SDOH Interventions:     Readmission Risk Interventions    08/08/2023   11:36 AM 03/21/2023    1:12 PM 08/27/2022   12:05 PM  Readmission Risk Prevention Plan  Transportation  Screening Complete Complete Complete  PCP or Specialist Appt within 3-5 Days  Complete Complete  HRI or Home Care Consult  Complete Complete  Social Work Consult for Recovery Care Planning/Counseling  Complete   Palliative Care Screening  Not Applicable   Medication Review [employer]) Complete Complete Complete  PCP or Specialist appointment within 3-5 days of discharge Complete    HRI or Home Care Consult Complete    SW Recovery Care/Counseling Consult Complete    Palliative Care Screening Not Applicable    Skilled Nursing Facility Not Applicable

## 2023-08-08 NOTE — Assessment & Plan Note (Addendum)
08-08-2023 on coreg 3.125 mg bid. Added IV lasix 40 mg bid. May need additional BP control. 08-09-2023 needs additional BP control. Given CKD stage 3b, will add hydralazine 50 mg tid. HR in low 60s on coreg 3.125 mg bid. 08-10-2023 BP has improved. Currently on coreg 3.125 mg bid(limited to increasing due to baseline HR of low 60s), hydralazine 50 mg tid, Imdur 30 mg at bedtime.  08-11-2023 DC to home on coreg 6.125 mg bid, hydralazine 50 mg tid, imdur 30 mg at bedtime, lasix 40 mg qam, jardiance 10 mg qday.

## 2023-08-08 NOTE — ED Notes (Signed)
Assisted pt to bedside commode.

## 2023-08-08 NOTE — Hospital Course (Addendum)
HPI: Leslie Parker is a 89 y.o. female with medical history significant of chronic HFpEF, asthma, atrial fibrillation, stage III CKD type 2 diabetes, hypertension presented with acute respiratory failure with hypoxia, acute on chronic HFpEF and pneumonia.  Patient reports increased work of breathing over the past 1 to 2 days.  Positive cough.  Mild orthopnea PND.  No fevers or chills.  No chest pain.  No nausea or vomiting.  No focal hemiparesis or confusion.  Baseline medical issues including atrial fibrillation and type 2 diabetes.  No recent medication changes.  No reported recent sick contacts.  No reported alcohol or tobacco use. Presented to the ER afebrile, hemodynamically stable.  Satting in upper 80s on room air, transition to BiPAP and then ultimately 2 L nasal cannula to keep O2 sats greater than 93%.  White count 4.9, hemoglobin 12.6, platelets 204, VBG stable, creatinine 2.07, glucose 189, lactate 1.1.  COVID flu and RSV negative.  BNP of 4285 with baseline BNP around 1000.  Chest x-ray with CHF versus pulmonary edema as well as left retrocardiac pneumonia. EKG NSR.   Significant Events: Admitted 08/07/2023 for acute respiratory failure with hypoxia, pneumonia, acute on chronic diastolic CHF   Significant Labs: White count 4.9, hemoglobin 12.6, platelets 204, VBG stable, creatinine 2.07, glucose 189, lactate 1.1.  COVID flu and RSV negative.  BNP of 4285 with baseline BNP around 1000   Significant Imaging Studies: Chest x-ray with CHF versus pulmonary edema as well as left retrocardiac pneumonia  Echo shows new reduced LVEF of 25%, global hypokinesis  Antibiotic Therapy: Anti-infectives (From admission, onward)    Start     Dose/Rate Route Frequency Ordered Stop   08/08/23 1000  cefTRIAXone (ROCEPHIN) 2 g in sodium chloride 0.9 % 100 mL IVPB        2 g 200 mL/hr over 30 Minutes Intravenous Every 24 hours 08/07/23 1626 08/12/23 0959   08/08/23 1000  azithromycin (ZITHROMAX) 500 mg in  sodium chloride 0.9 % 250 mL IVPB        500 mg 250 mL/hr over 60 Minutes Intravenous Every 24 hours 08/07/23 1626 08/12/23 0959   08/07/23 1300  cefTRIAXone (ROCEPHIN) 2 g in sodium chloride 0.9 % 100 mL IVPB        2 g 200 mL/hr over 30 Minutes Intravenous Once 08/07/23 1250 08/07/23 1357   08/07/23 1300  azithromycin (ZITHROMAX) 500 mg in sodium chloride 0.9 % 250 mL IVPB        500 mg 250 mL/hr over 60 Minutes Intravenous  Once 08/07/23 1250 08/07/23 1503       Procedures:   Consultants: Cardiology

## 2023-08-08 NOTE — Progress Notes (Addendum)
PROGRESS NOTE    Leslie Parker  ZOX:[id number] DOB: 01-29-1933 DOA: 08/07/2023 PCP: [name], MD  Subjective: Pt seen and examined. Pt states she has not missed any of her lasix doses despite what EMS report said. Pt does not use home O2. Was on RA this AM with O2 sats 95%  Pt does state that her legs are more swollen than normal. Pt states that she fills up her own pill box for 2 weeks at a time.   Hospital Course: HPI: Leslie Parker is a 89 y.o. female with medical history significant of chronic HFpEF, asthma, atrial fibrillation, stage III CKD type 2 diabetes, hypertension presented with acute respiratory failure with hypoxia, acute on chronic HFpEF and pneumonia.  Patient reports increased work of breathing over the past 1 to 2 days.  Positive cough.  Mild orthopnea PND.  No fevers or chills.  No chest pain.  No nausea or vomiting.  No focal hemiparesis or confusion.  Baseline medical issues including atrial fibrillation and type 2 diabetes.  No recent medication changes.  No reported recent sick contacts.  No reported alcohol or tobacco use. Presented to the ER afebrile, hemodynamically stable.  Satting in upper 80s on room air, transition to BiPAP and then ultimately 2 L nasal cannula to keep O2 sats greater than 93%.  White count 4.9, hemoglobin 12.6, platelets 204, VBG stable, creatinine 2.07, glucose 189, lactate 1.1.  COVID flu and RSV negative.  BNP of 4285 with baseline BNP around 1000.  Chest x-ray with CHF versus pulmonary edema as well as left retrocardiac pneumonia. EKG NSR.   Significant Events: Admitted 08/07/2023 for acute respiratory failure with hypoxia, pneumonia, acute on chronic diastolic CHF   Significant Labs: White count 4.9, hemoglobin 12.6, platelets 204, VBG stable, creatinine 2.07, glucose 189, lactate 1.1.  COVID flu and RSV negative.  BNP of 4285 with baseline BNP around 1000   Significant Imaging Studies: Chest x-ray with CHF versus pulmonary  edema as well as left retrocardiac pneumonia   Antibiotic Therapy: Anti-infectives (From admission, onward)    Start     Dose/Rate Route Frequency Ordered Stop   08/08/23 1000  cefTRIAXone (ROCEPHIN) 2 g in sodium chloride 0.9 % 100 mL IVPB        2 g 200 mL/hr over 30 Minutes Intravenous Every 24 hours 08/07/23 1626 08/12/23 0959   08/08/23 1000  azithromycin (ZITHROMAX) 500 mg in sodium chloride 0.9 % 250 mL IVPB        500 mg 250 mL/hr over 60 Minutes Intravenous Every 24 hours 08/07/23 1626 08/12/23 0959   08/07/23 1300  cefTRIAXone (ROCEPHIN) 2 g in sodium chloride 0.9 % 100 mL IVPB        2 g 200 mL/hr over 30 Minutes Intravenous Once 08/07/23 1250 08/07/23 1357   08/07/23 1300  azithromycin (ZITHROMAX) 500 mg in sodium chloride 0.9 % 250 mL IVPB        500 mg 250 mL/hr over 60 Minutes Intravenous  Once 08/07/23 1250 08/07/23 1503       Procedures:   Consultants:     Assessment and Plan: * Acute respiratory failure with hypoxia (HCC) On admission, pt was diagnosed with Pneumonia, Acute on chronic HFpEF. Decompensated resp failure requiring 2 L and cannula on presentation. Likely secondary to overlapping pneumonia as well as acute on chronic HFpEF. BNP 4285 which is well above baseline. Chest x-ray with CHF and left retrocardiac pneumonia. IV Lasix. IV Rocephin azithromycin for infectious coverage.  Blood and respiratory cultures.Monitor respiratory status appropriately.  08-08-2023 pt has been weaned to RA.  Pt remains on IV lasix. Pt has normal WBC and no fevers.  Have ordered procalcitonin but just found out that lab machine for procalcitonin is broken. I do not strongly feel she has pneumonia given how fast her hypoxia has cleared. Will leave abx going for 1 more day. Hopefully procalcitonin lab will be able to be run tomorrow. I think her acute respiratory failure with hypoxia was due to pulmonary edema from acute on chronic diastolic CHF.  Acute on chronic diastolic CHF  (congestive heart failure) (HCC) On admission, 2D echo July 2024 with EF 60 to 65% and grade 1 diastolic dysfunction. BNP 4285 today and CHF pattern. S/p 20mg  IV lasix in the ER. Will acutely increase home lasix 20mg  daily-->40mg  daily. Weight today 88kg. Strict Is and Os and daily weights. Cardiology consult as clinically indicated.   08-08-2023 continue with lasix 40 mg IV q12h. Continue with coreg 3.125 mg bid.  Add Jardiance 10 mg qday.  Pt denies any hx of frequent UTIs. Monitor Scr. Was seen by outpt cardiology in 06-2023. No mention as to why pt is not on GDMT. Specifically not on Entresto.  Possibly due to CKD stage 3b. Will need referral back to cards clinic for them to decide if this is appropriate for patient.  Type II diabetes mellitus with renal manifestations (HCC) On admission, Blood sugar 180s. SSI. A1C. Monitor   08-08-2023 will order A1C. CBG acceptable ranges. Continue with SSI. Add Jardiance for additional diuretic effect. Should also help with CBG.   COPD (chronic obstructive pulmonary disease) (HCC) On admission, Appears fairly stable from a respiratory standpoint. Cont prn inhalers   08-08-2023 stable. Not exacerbated.   Chronic a-fib (HCC) On admission, Rate controlled at present. Cont home regimen including amiodarone and eliquis   08-08-2023 stable. Continue coreg 3.125 mg bid, eliquis 5 mg bid, amiodarone 200 mg daily.  EKG on admission showed NSR.   Stage 3b chronic kidney disease (HCC) - Baseline scr 1.8-2.0 On admission, Cr 2 w/ GFR in 20s. Appears near baseline. Monitor   08-08-2023 Scr 1.84, BUN 39 with diuresis. Continue to monitor during diuresis. Baseline scr 1.8-2.0  Essential hypertension 08-08-2023 on coreg 3.125 mg bid. Added IV lasix 40 mg bid. May need additional BP control.   DVT prophylaxis: apixaban (ELIQUIS) tablet 2.5 mg Start: 08/07/23 2200 apixaban (ELIQUIS) tablet 2.5 mg     Code Status: Full Code Family Communication: no family at  bedside Disposition Plan: return home. Pt states she lives alone in a senior independent living center Reason for continuing need for hospitalization: remains on IV lasix.  Objective: Vitals:   08/08/23 0845 08/08/23 0904 08/08/23 0907 08/08/23 1100  BP:   (!) 162/79 (!) 168/82  Pulse: 62  61 69  Resp: (!) 21   14  Temp:  (!) 97.4 F (36.3 C)    TempSrc:  Oral    SpO2: 97%   92%  Weight:      Height:        Intake/Output Summary (Last 24 hours) at 08/08/2023 1151 Last data filed at 08/08/2023 0500 Gross per 24 hour  Intake --  Output 750 ml  Net -750 ml   Filed Weights   08/07/23 1242  Weight: 88.2 kg    Examination:  Physical Exam Vitals and nursing note reviewed.  HENT:     Head: Normocephalic and atraumatic.     Nose: Nose  normal.  Eyes:     General: No scleral icterus. Cardiovascular:     Rate and Rhythm: Normal rate and regular rhythm.     Pulses: Normal pulses.  Pulmonary:     Effort: Pulmonary effort is normal.     Breath sounds: Rales present. No wheezing or rhonchi.  Abdominal:     General: Bowel sounds are normal. There is no distension.     Palpations: Abdomen is soft.  Musculoskeletal:     Right lower leg: 2+ Edema present.     Left lower leg: 2+ Edema present.     Comments: +2 pitting bilateral pretibial and ankle edema  Skin:    General: Skin is warm and dry.     Capillary Refill: Capillary refill takes less than 2 seconds.  Neurological:     General: No focal deficit present.     Mental Status: She is alert and oriented to person, place, and time.    Data Reviewed: I have personally reviewed following labs and imaging studies  CBC: Recent Labs  Lab 08/07/23 1248 08/08/23 0410  WBC 4.9 4.6  NEUTROABS 3.7  --   HGB 12.6 11.0*  HCT 38.0 35.8*  MCV 91.6 96.5  PLT 204 182   Basic Metabolic Panel: Recent Labs  Lab 08/07/23 1248 08/08/23 0410  NA 136 137  K 3.9 3.5  CL 104 106  CO2 20* 19*  GLUCOSE 189* 111*  BUN 43* 39*   CREATININE 2.07* 1.84*  CALCIUM 8.3* 7.8*   GFR: Estimated Creatinine Clearance: 25 mL/min (A) (by C-G formula based on SCr of 1.84 mg/dL (H)). Liver Function Tests: Recent Labs  Lab 08/07/23 1248 08/08/23 0410  AST 42* 27  ALT 41 28  ALKPHOS 80 63  BILITOT 1.0 1.0  PROT 6.0* 4.9*  ALBUMIN 3.3* 2.5*   Coagulation Profile: Recent Labs  Lab 08/07/23 1248  INR 1.1   BNP (last 3 results) Recent Labs    06/04/23 1308 06/16/23 0912 08/07/23 1248  BNP 1,527.8* 1,602.3* 4,285.0*   CBG: Recent Labs  Lab 08/07/23 1737 08/07/23 2243 08/08/23 0906  GLUCAP 151* 115* 109*   Sepsis Labs: Recent Labs  Lab 08/07/23 1248  LATICACIDVEN 1.1    Recent Results (from the past 240 hours)  Culture, blood (Routine x 2)     Status: None (Preliminary result)   Collection Time: 08/07/23 12:48 PM   Specimen: BLOOD  Result Value Ref Range Status   Specimen Description BLOOD BLOOD RIGHT ARM  Final   Special Requests   Final    BOTTLES DRAWN AEROBIC ONLY Blood Culture results may not be optimal due to an inadequate volume of blood received in culture bottles   Culture   Final    NO GROWTH < 24 HOURS Performed at [facility], [address] [place], [place] [address]    Report Status PENDING  Incomplete  Resp panel by RT-PCR (RSV, Flu A&B, Covid) Anterior Nasal Swab     Status: None   Collection Time: 08/07/23 12:48 PM   Specimen: Anterior Nasal Swab  Result Value Ref Range Status   SARS Coronavirus 2 by RT PCR NEGATIVE NEGATIVE Final    Comment: (NOTE) SARS-CoV-2 target nucleic acids are NOT DETECTED.  The SARS-CoV-2 RNA is generally detectable in upper respiratory specimens during the acute phase of infection. The lowest concentration of SARS-CoV-2 viral copies this assay can detect is 138 copies/mL. A negative result does not preclude SARS-Cov-2 infection and should not be used as  the sole basis for treatment or other patient management decisions. A negative  result may occur with  improper specimen collection/handling, submission of specimen other than nasopharyngeal swab, presence of viral mutation(s) within the areas targeted by this assay, and inadequate number of viral copies(<138 copies/mL). A negative result must be combined with clinical observations, patient history, and epidemiological information. The expected result is Negative.  Fact Sheet for Patients:  BloggerCourse.com  Fact Sheet for Healthcare Providers:  SeriousBroker.it  This test is no t yet approved or cleared by the [place] FDA and  has been authorized for detection and/or diagnosis of SARS-CoV-2 by FDA under an Emergency Use Authorization (EUA). This EUA will remain  in effect (meaning this test can be used) for the duration of the COVID-19 declaration under Section 564(b)(1) of the Act, 21 U.S.C.section 360bbb-3(b)(1), unless the authorization is terminated  or revoked sooner.       Influenza A by PCR NEGATIVE NEGATIVE Final   Influenza B by PCR NEGATIVE NEGATIVE Final    Comment: (NOTE) The Xpert Xpress SARS-CoV-2/FLU/RSV plus assay is intended as an aid in the diagnosis of influenza from Nasopharyngeal swab specimens and should not be used as a sole basis for treatment. Nasal washings and aspirates are unacceptable for Xpert Xpress SARS-CoV-2/FLU/RSV testing.  Fact Sheet for Patients: BloggerCourse.com  Fact Sheet for Healthcare Providers: SeriousBroker.it  This test is not yet approved or cleared by the [place] FDA and has been authorized for detection and/or diagnosis of SARS-CoV-2 by FDA under an Emergency Use Authorization (EUA). This EUA will remain in effect (meaning this test can be used) for the duration of the COVID-19 declaration under Section 564(b)(1) of the Act, 21 U.S.C. section 360bbb-3(b)(1), unless the authorization is  terminated or revoked.     Resp Syncytial Virus by PCR NEGATIVE NEGATIVE Final    Comment: (NOTE) Fact Sheet for Patients: BloggerCourse.com  Fact Sheet for Healthcare Providers: SeriousBroker.it  This test is not yet approved or cleared by the [place] FDA and has been authorized for detection and/or diagnosis of SARS-CoV-2 by FDA under an Emergency Use Authorization (EUA). This EUA will remain in effect (meaning this test can be used) for the duration of the COVID-19 declaration under Section 564(b)(1) of the Act, 21 U.S.C. section 360bbb-3(b)(1), unless the authorization is terminated or revoked.  Performed at [facility], [address] [place], [place] [address]   Culture, blood (Routine x 2)     Status: None (Preliminary result)   Collection Time: 08/07/23  1:17 PM   Specimen: BLOOD  Result Value Ref Range Status   Specimen Description BLOOD BLOOD LEFT ARM  Final   Special Requests   Final    BOTTLES DRAWN AEROBIC AND ANAEROBIC Blood Culture results may not be optimal due to an inadequate volume of blood received in culture bottles   Culture   Final    NO GROWTH < 24 HOURS Performed at [facility], [address], [place], [place] [address]    Report Status PENDING  Incomplete     Radiology Studies: DG Chest Port 1 View Result Date: 08/07/2023 CLINICAL DATA:  [id number] Sepsis (HCC) [id number]. Respiratory distress. Difficulty breathing. EXAM: PORTABLE CHEST 1 VIEW COMPARISON:  06/16/2023. FINDINGS: Low lung volume. Redemonstration of left retrocardiac airspace opacity obscuring the left hemidiaphragm, descending thoracic aorta and blunting the left lateral costophrenic angle, suggesting combination of left lung atelectasis and/or consolidation with pleural effusion. There is interval worsening since the prior study. There  is also probable small layering right pleural effusion. There is mild diffuse  pulmonary vascular congestion. No pneumothorax. Stable cardio-mediastinal silhouette. There is a left sided 2-lead pacemaker. No acute osseous abnormalities. Right reverse shoulder arthroplasty noted. The soft tissues are within normal limits. IMPRESSION: *Findings favor congestive heart failure/pulmonary edema. *There is worsening left retrocardiac opacity, as described above. Electronically Signed   By: Jules Schick M.D.   On: 08/07/2023 15:40    Scheduled Meds:  amiodarone  200 mg Oral Daily   apixaban  2.5 mg Oral BID   carvedilol  3.125 mg Oral BID WC   doxycycline  100 mg Oral Q12H   empagliflozin  10 mg Oral Daily   furosemide  40 mg Intravenous Q12H   insulin aspart  0-9 Units Subcutaneous TID WC   Continuous Infusions:  cefTRIAXone (ROCEPHIN)  IV Stopped (08/08/23 0946)     LOS: 1 day   Time spent: 40 minutes  Carollee Herter, DO  [facility]  08/08/2023, 11:51 AM

## 2023-08-09 ENCOUNTER — Inpatient Hospital Stay (HOSPITAL_COMMUNITY)
Admit: 2023-08-09 | Discharge: 2023-08-09 | Disposition: A | Discharge status: Home / Self Care | Payer: Medicare HMO | Attending: Internal Medicine | Admitting: Internal Medicine

## 2023-08-09 DIAGNOSIS — I482 Chronic atrial fibrillation, unspecified: Secondary | ICD-10-CM | POA: Diagnosis not present

## 2023-08-09 DIAGNOSIS — J9601 Acute respiratory failure with hypoxia: Secondary | ICD-10-CM | POA: Diagnosis not present

## 2023-08-09 DIAGNOSIS — I5033 Acute on chronic diastolic (congestive) heart failure: Secondary | ICD-10-CM

## 2023-08-09 DIAGNOSIS — J449 Chronic obstructive pulmonary disease, unspecified: Secondary | ICD-10-CM | POA: Diagnosis not present

## 2023-08-09 LAB — COMPREHENSIVE METABOLIC PANEL
ALT: 26 U/L (ref 0–44)
AST: 21 U/L (ref 15–41)
Albumin: 2.8 g/dL — ABNORMAL LOW (ref 3.5–5.0)
Alkaline Phosphatase: 68 U/L (ref 38–126)
Anion gap: 12 (ref 5–15)
BUN: 38 mg/dL — ABNORMAL HIGH (ref 8–23)
CO2: 25 mmol/L (ref 22–32)
Calcium: 8.1 mg/dL — ABNORMAL LOW (ref 8.9–10.3)
Chloride: 101 mmol/L (ref 98–111)
Creatinine, Ser: 1.98 mg/dL — ABNORMAL HIGH (ref 0.44–1.00)
GFR, Estimated: 24 mL/min — ABNORMAL LOW (ref >60)
Glucose, Bld: 109 mg/dL — ABNORMAL HIGH (ref 70–99)
Potassium: 3.4 mmol/L — ABNORMAL LOW (ref 3.5–5.1)
Sodium: 138 mmol/L (ref 135–145)
Total Bilirubin: 0.9 mg/dL (ref 0.0–1.2)
Total Protein: 5.3 g/dL — ABNORMAL LOW (ref 6.5–8.1)

## 2023-08-09 LAB — ECHOCARDIOGRAM COMPLETE
AV Mean grad: 8.5 mm[Hg]
AV Peak grad: 18 mm[Hg]
Ao pk vel: 2.12 m/s
Area-P 1/2: 6.02 cm2
Calc EF: 26.6 %
Height: 69 in
S' Lateral: 3.7 cm
Single Plane A2C EF: 26.5 %
Single Plane A4C EF: 27.2 %
Weight: 3112 [oz_av]

## 2023-08-09 LAB — GLUCOSE, CAPILLARY
Glucose-Capillary: 110 mg/dL — ABNORMAL HIGH (ref 70–99)
Glucose-Capillary: 110 mg/dL — ABNORMAL HIGH (ref 70–99)
Glucose-Capillary: 142 mg/dL — ABNORMAL HIGH (ref 70–99)
Glucose-Capillary: 151 mg/dL — ABNORMAL HIGH (ref 70–99)

## 2023-08-09 LAB — CBG MONITORING, ED: Glucose-Capillary: 107 mg/dL — ABNORMAL HIGH (ref 70–99)

## 2023-08-09 MED ORDER — CLONIDINE HCL 0.1 MG PO TABS
0.2000 mg | ORAL_TABLET | ORAL | Status: DC | PRN
  Administered 2023-08-09: 0.2 mg via ORAL
  Filled 2023-08-09: qty 2

## 2023-08-09 MED ORDER — ISOSORBIDE MONONITRATE ER 30 MG PO TB24
30.0000 mg | ORAL_TABLET | Freq: Every day | ORAL | Status: DC
Start: 2023-08-09 — End: 2023-08-11
  Administered 2023-08-09 – 2023-08-10 (×2): 30 mg via ORAL
  Filled 2023-08-09 (×2): qty 1

## 2023-08-09 MED ORDER — HYDRALAZINE HCL 50 MG PO TABS
50.0000 mg | ORAL_TABLET | Freq: Three times a day (TID) | ORAL | Status: DC
  Administered 2023-08-09 – 2023-08-11 (×7): 50 mg via ORAL
  Filled 2023-08-09 (×7): qty 1

## 2023-08-09 MED ORDER — POTASSIUM CHLORIDE CRYS ER 20 MEQ PO TBCR
40.0000 meq | EXTENDED_RELEASE_TABLET | Freq: Once | ORAL | Status: AC
  Administered 2023-08-09: 40 meq via ORAL
  Filled 2023-08-09: qty 2

## 2023-08-09 NOTE — Progress Notes (Signed)
PROGRESS NOTE    Leslie Parker  VHQ:[id number] DOB: Jul 29, 1932 DOA: 08/07/2023 PCP: [name], MD  Subjective: Pt seen and examined. Remains on RA WBC 4.6. afebrile. No cough. LE edema has improved.   Hospital Course: HPI: Leslie Parker is a 89 y.o. female with medical history significant of chronic HFpEF, asthma, atrial fibrillation, stage III CKD type 2 diabetes, hypertension presented with acute respiratory failure with hypoxia, acute on chronic HFpEF and pneumonia.  Patient reports increased work of breathing over the past 1 to 2 days.  Positive cough.  Mild orthopnea PND.  No fevers or chills.  No chest pain.  No nausea or vomiting.  No focal hemiparesis or confusion.  Baseline medical issues including atrial fibrillation and type 2 diabetes.  No recent medication changes.  No reported recent sick contacts.  No reported alcohol or tobacco use. Presented to the ER afebrile, hemodynamically stable.  Satting in upper 80s on room air, transition to BiPAP and then ultimately 2 L nasal cannula to keep O2 sats greater than 93%.  White count 4.9, hemoglobin 12.6, platelets 204, VBG stable, creatinine 2.07, glucose 189, lactate 1.1.  COVID flu and RSV negative.  BNP of 4285 with baseline BNP around 1000.  Chest x-ray with CHF versus pulmonary edema as well as left retrocardiac pneumonia. EKG NSR.   Significant Events: Admitted 08/07/2023 for acute respiratory failure with hypoxia, pneumonia, acute on chronic diastolic CHF   Significant Labs: White count 4.9, hemoglobin 12.6, platelets 204, VBG stable, creatinine 2.07, glucose 189, lactate 1.1.  COVID flu and RSV negative.  BNP of 4285 with baseline BNP around 1000   Significant Imaging Studies: Chest x-ray with CHF versus pulmonary edema as well as left retrocardiac pneumonia   Antibiotic Therapy: Anti-infectives (From admission, onward)    Start     Dose/Rate Route Frequency Ordered Stop   08/08/23 1000  cefTRIAXone (ROCEPHIN) 2 g  in sodium chloride 0.9 % 100 mL IVPB        2 g 200 mL/hr over 30 Minutes Intravenous Every 24 hours 08/07/23 1626 08/12/23 0959   08/08/23 1000  azithromycin (ZITHROMAX) 500 mg in sodium chloride 0.9 % 250 mL IVPB        500 mg 250 mL/hr over 60 Minutes Intravenous Every 24 hours 08/07/23 1626 08/12/23 0959   08/07/23 1300  cefTRIAXone (ROCEPHIN) 2 g in sodium chloride 0.9 % 100 mL IVPB        2 g 200 mL/hr over 30 Minutes Intravenous Once 08/07/23 1250 08/07/23 1357   08/07/23 1300  azithromycin (ZITHROMAX) 500 mg in sodium chloride 0.9 % 250 mL IVPB        500 mg 250 mL/hr over 60 Minutes Intravenous  Once 08/07/23 1250 08/07/23 1503       Procedures:   Consultants:     Assessment and Plan: * Acute respiratory failure with hypoxia (HCC) On admission, pt was diagnosed with Pneumonia, Acute on chronic HFpEF. Decompensated resp failure requiring 2 L and cannula on presentation. Likely secondary to overlapping pneumonia as well as acute on chronic HFpEF. BNP 4285 which is well above baseline. Chest x-ray with CHF and left retrocardiac pneumonia. IV Lasix. IV Rocephin azithromycin for infectious coverage. Blood and respiratory cultures.Monitor respiratory status appropriately. 08-08-2023 pt has been weaned to RA.  Pt remains on IV lasix. Pt has normal WBC and no fevers.  Have ordered procalcitonin but just found out that lab machine for procalcitonin is broken. I do not strongly feel  she has pneumonia given how fast her hypoxia has cleared. Will leave abx going for 1 more day. Hopefully procalcitonin lab will be able to be run tomorrow. I think her acute respiratory failure with hypoxia was due to pulmonary edema from acute on chronic diastolic CHF.  07-30-2023 hypoxia has resolved. Due to CHF, not pneumonia. Pneumonia has been rule out. Stop abx.  Acute on chronic diastolic CHF (congestive heart failure) (HCC) On admission, 2D echo July 2024 with EF 60 to 65% and grade 1 diastolic  dysfunction. BNP 4285 today and CHF pattern. S/p 20mg  IV lasix in the ER. Will acutely increase home lasix 20mg  daily-->40mg  daily. Weight today 88kg. Strict Is and Os and daily weights. Cardiology consult as clinically indicated.  08-08-2023 continue with lasix 40 mg IV q12h. Continue with coreg 3.125 mg bid.  Add Jardiance 10 mg qday.  Pt denies any hx of frequent UTIs. Monitor Scr. Was seen by outpt cardiology in 06-2023. No mention as to why pt is not on GDMT. Specifically not on Entresto.  Possibly due to CKD stage 3b. Will need referral back to cards clinic for them to decide if this is appropriate for patient.  08-09-2023 awaiting labs. Possible change to po lasix.  Type II diabetes mellitus with renal manifestations (HCC) On admission, Blood sugar 180s. SSI. A1C. Monitor  08-08-2023 will order A1C. CBG acceptable ranges. Continue with SSI. Add Jardiance for additional diuretic effect. Should also help with CBG.  08-09-2023 stable. CBG acceptable ranges.   COPD (chronic obstructive pulmonary disease) (HCC) On admission, Appears fairly stable from a respiratory standpoint. Cont prn inhalers  08-08-2023 stable. Not exacerbated.  08-09-2023 stable.    Chronic a-fib (HCC) On admission, Rate controlled at present. Cont home regimen including amiodarone and eliquis  08-08-2023 stable. Continue coreg 3.125 mg bid, eliquis 5 mg bid, amiodarone 200 mg daily.  EKG on admission showed NSR.  08-09-2023 stable.   Stage 3b chronic kidney disease (HCC) - Baseline scr 1.8-2.0 On admission, Cr 2 w/ GFR in 20s. Appears near baseline. Monitor  08-08-2023 Scr 1.84, BUN 39 with diuresis. Continue to monitor during diuresis. Baseline scr 1.8-2.0  08-08-2022 awaiting AM labs.  Essential hypertension 08-08-2023 on coreg 3.125 mg bid. Added IV lasix 40 mg bid. May need additional BP control.  08-09-2023 needs additional BP control. Given CKD stage 3b, will add hydralazine 50 mg tid. HR in low 60s on  coreg 3.125 mg bid.       DVT prophylaxis: apixaban (ELIQUIS) tablet 2.5 mg Start: 08/07/23 2200 apixaban (ELIQUIS) tablet 2.5 mg     Code Status: Full Code Family Communication: no family at bedside Disposition Plan: return home Reason for continuing need for hospitalization: remains on IV lasix.  Objective: Vitals:   08/09/23 0400 08/09/23 0600 08/09/23 0745 08/09/23 1137  BP: (!) 171/97 (!) 179/83 (!) 187/92 (!) 172/81  Pulse: 65 60 65 63  Resp: 18 12 16 18   Temp:    (!) 97.5 F (36.4 C)  TempSrc:      SpO2: 96% 95% 96% 96%  Weight:      Height:        Intake/Output Summary (Last 24 hours) at 08/09/2023 1202 Last data filed at 08/09/2023 0515 Gross per 24 hour  Intake --  Output 1700 ml  Net -1700 ml   Filed Weights   08/07/23 1242  Weight: 88.2 kg    Examination:  Physical Exam Vitals and nursing note reviewed.  Constitutional:  General: She is not in acute distress.    Appearance: She is not toxic-appearing or diaphoretic.  HENT:     Nose: Nose normal.  Cardiovascular:     Rate and Rhythm: Normal rate and regular rhythm.     Pulses: Normal pulses.  Pulmonary:     Effort: Pulmonary effort is normal. No respiratory distress.     Breath sounds: Normal breath sounds.  Abdominal:     General: Bowel sounds are normal.     Palpations: Abdomen is soft.  Musculoskeletal:     Comments: Only trace pitting edema of ankles. No more pitting edema of pretibial area.  Skin:    General: Skin is warm and dry.     Capillary Refill: Capillary refill takes less than 2 seconds.  Neurological:     General: No focal deficit present.     Mental Status: She is alert and oriented to person, place, and time.     Data Reviewed: I have personally reviewed following labs and imaging studies  CBC: Recent Labs  Lab 08/07/23 1248 08/08/23 0410  WBC 4.9 4.6  NEUTROABS 3.7  --   HGB 12.6 11.0*  HCT 38.0 35.8*  MCV 91.6 96.5  PLT 204 182   Basic Metabolic  Panel: Recent Labs  Lab 08/07/23 1248 08/08/23 0410  NA 136 137  K 3.9 3.5  CL 104 106  CO2 20* 19*  GLUCOSE 189* 111*  BUN 43* 39*  CREATININE 2.07* 1.84*  CALCIUM 8.3* 7.8*   GFR: Estimated Creatinine Clearance: 25 mL/min (A) (by C-G formula based on SCr of 1.84 mg/dL (H)). Liver Function Tests: Recent Labs  Lab 08/07/23 1248 08/08/23 0410  AST 42* 27  ALT 41 28  ALKPHOS 80 63  BILITOT 1.0 1.0  PROT 6.0* 4.9*  ALBUMIN 3.3* 2.5*   Coagulation Profile: Recent Labs  Lab 08/07/23 1248  INR 1.1   BNP (last 3 results) Recent Labs    06/04/23 1308 06/16/23 0912 08/07/23 1248  BNP 1,527.8* 1,602.3* 4,285.0*   HbA1C: Recent Labs    08/08/23 0410  HGBA1C 7.0*   CBG: Recent Labs  Lab 08/08/23 1205 08/08/23 1758 08/09/23 0513 08/09/23 0945 08/09/23 1138  GLUCAP 167* 100* 107* 110* 110*   Sepsis Labs: Recent Labs  Lab 08/07/23 1248 08/08/23 0903  PROCALCITON  --  <0.10  LATICACIDVEN 1.1  --     Recent Results (from the past 240 hours)  Culture, blood (Routine x 2)     Status: None (Preliminary result)   Collection Time: 08/07/23 12:48 PM   Specimen: BLOOD  Result Value Ref Range Status   Specimen Description BLOOD BLOOD RIGHT ARM  Final   Special Requests   Final    BOTTLES DRAWN AEROBIC ONLY Blood Culture results may not be optimal due to an inadequate volume of blood received in culture bottles   Culture   Final    NO GROWTH 2 DAYS Performed at [facility], [address] [place], [place] [address]    Report Status PENDING  Incomplete  Resp panel by RT-PCR (RSV, Flu A&B, Covid) Anterior Nasal Swab     Status: None   Collection Time: 08/07/23 12:48 PM   Specimen: Anterior Nasal Swab  Result Value Ref Range Status   SARS Coronavirus 2 by RT PCR NEGATIVE NEGATIVE Final    Comment: (NOTE) SARS-CoV-2 target nucleic acids are NOT DETECTED.  The SARS-CoV-2 RNA is generally detectable in upper respiratory specimens during the acute  phase of  infection. The lowest concentration of SARS-CoV-2 viral copies this assay can detect is 138 copies/mL. A negative result does not preclude SARS-Cov-2 infection and should not be used as the sole basis for treatment or other patient management decisions. A negative result may occur with  improper specimen collection/handling, submission of specimen other than nasopharyngeal swab, presence of viral mutation(s) within the areas targeted by this assay, and inadequate number of viral copies(<138 copies/mL). A negative result must be combined with clinical observations, patient history, and epidemiological information. The expected result is Negative.  Fact Sheet for Patients:  BloggerCourse.com  Fact Sheet for Healthcare Providers:  SeriousBroker.it  This test is no t yet approved or cleared by the [place] FDA and  has been authorized for detection and/or diagnosis of SARS-CoV-2 by FDA under an Emergency Use Authorization (EUA). This EUA will remain  in effect (meaning this test can be used) for the duration of the COVID-19 declaration under Section 564(b)(1) of the Act, 21 U.S.C.section 360bbb-3(b)(1), unless the authorization is terminated  or revoked sooner.       Influenza A by PCR NEGATIVE NEGATIVE Final   Influenza B by PCR NEGATIVE NEGATIVE Final    Comment: (NOTE) The Xpert Xpress SARS-CoV-2/FLU/RSV plus assay is intended as an aid in the diagnosis of influenza from Nasopharyngeal swab specimens and should not be used as a sole basis for treatment. Nasal washings and aspirates are unacceptable for Xpert Xpress SARS-CoV-2/FLU/RSV testing.  Fact Sheet for Patients: BloggerCourse.com  Fact Sheet for Healthcare Providers: SeriousBroker.it  This test is not yet approved or cleared by the [place] FDA and has been authorized for detection and/or diagnosis of  SARS-CoV-2 by FDA under an Emergency Use Authorization (EUA). This EUA will remain in effect (meaning this test can be used) for the duration of the COVID-19 declaration under Section 564(b)(1) of the Act, 21 U.S.C. section 360bbb-3(b)(1), unless the authorization is terminated or revoked.     Resp Syncytial Virus by PCR NEGATIVE NEGATIVE Final    Comment: (NOTE) Fact Sheet for Patients: BloggerCourse.com  Fact Sheet for Healthcare Providers: SeriousBroker.it  This test is not yet approved or cleared by the [place] FDA and has been authorized for detection and/or diagnosis of SARS-CoV-2 by FDA under an Emergency Use Authorization (EUA). This EUA will remain in effect (meaning this test can be used) for the duration of the COVID-19 declaration under Section 564(b)(1) of the Act, 21 U.S.C. section 360bbb-3(b)(1), unless the authorization is terminated or revoked.  Performed at [facility], [address] [place], [place] [address]   Culture, blood (Routine x 2)     Status: None (Preliminary result)   Collection Time: 08/07/23  1:17 PM   Specimen: BLOOD  Result Value Ref Range Status   Specimen Description BLOOD BLOOD LEFT ARM  Final   Special Requests   Final    BOTTLES DRAWN AEROBIC AND ANAEROBIC Blood Culture results may not be optimal due to an inadequate volume of blood received in culture bottles   Culture   Final    NO GROWTH 2 DAYS Performed at [facility], [address], [place], [place] [address]    Report Status PENDING  Incomplete     Radiology Studies: DG Chest Port 1 View Result Date: 08/07/2023 CLINICAL DATA:  [id number] Sepsis (HCC) [id number]. Respiratory distress. Difficulty breathing. EXAM: PORTABLE CHEST 1 VIEW COMPARISON:  06/16/2023. FINDINGS: Low lung volume. Redemonstration of left retrocardiac airspace opacity obscuring the left hemidiaphragm, descending thoracic aorta  and  blunting the left lateral costophrenic angle, suggesting combination of left lung atelectasis and/or consolidation with pleural effusion. There is interval worsening since the prior study. There is also probable small layering right pleural effusion. There is mild diffuse pulmonary vascular congestion. No pneumothorax. Stable cardio-mediastinal silhouette. There is a left sided 2-lead pacemaker. No acute osseous abnormalities. Right reverse shoulder arthroplasty noted. The soft tissues are within normal limits. IMPRESSION: *Findings favor congestive heart failure/pulmonary edema. *There is worsening left retrocardiac opacity, as described above. Electronically Signed   By: Jules Schick M.D.   On: 08/07/2023 15:40    Scheduled Meds:  amiodarone  200 mg Oral Daily   apixaban  2.5 mg Oral BID   carvedilol  3.125 mg Oral BID WC   empagliflozin  10 mg Oral Daily   furosemide  40 mg Intravenous Q12H   hydrALAZINE  50 mg Oral Q8H   insulin aspart  0-9 Units Subcutaneous TID WC   Continuous Infusions:   LOS: 2 days   Time spent: 40 minutes  Carollee Herter, DO  [facility]  08/09/2023, 12:02 PM

## 2023-08-09 NOTE — Plan of Care (Signed)
  Problem: Education: Goal: Knowledge of General Education information will improve Description: Including pain rating scale, medication(s)/side effects and non-pharmacologic comfort measures Outcome: Progressing   Problem: Health Behavior/Discharge Planning: Goal: Ability to manage health-related needs will improve Outcome: Progressing   Problem: Clinical Measurements: Goal: Cardiovascular complication will be avoided Outcome: Progressing   Problem: Elimination: Goal: Will not experience complications related to bowel motility Outcome: Progressing   Problem: Elimination: Goal: Will not experience complications related to urinary retention Outcome: Progressing   Problem: Pain Managment: Goal: General experience of comfort will improve and/or be controlled Outcome: Progressing   Problem: Safety: Goal: Ability to remain free from injury will improve Outcome: Progressing   Problem: Activity: Goal: Ability to tolerate increased activity will improve Outcome: Progressing

## 2023-08-09 NOTE — Progress Notes (Signed)
*  PRELIMINARY RESULTS* Echocardiogram 2D Echocardiogram has been performed.  Leslie Parker 08/09/2023, 4:13 PM

## 2023-08-10 ENCOUNTER — Inpatient Hospital Stay: Payer: Medicare HMO

## 2023-08-10 DIAGNOSIS — I5041 Acute combined systolic (congestive) and diastolic (congestive) heart failure: Secondary | ICD-10-CM | POA: Diagnosis not present

## 2023-08-10 DIAGNOSIS — I5043 Acute on chronic combined systolic (congestive) and diastolic (congestive) heart failure: Secondary | ICD-10-CM

## 2023-08-10 DIAGNOSIS — I482 Chronic atrial fibrillation, unspecified: Secondary | ICD-10-CM | POA: Diagnosis not present

## 2023-08-10 DIAGNOSIS — N1832 Chronic kidney disease, stage 3b: Secondary | ICD-10-CM | POA: Diagnosis not present

## 2023-08-10 DIAGNOSIS — E876 Hypokalemia: Secondary | ICD-10-CM

## 2023-08-10 DIAGNOSIS — I495 Sick sinus syndrome: Secondary | ICD-10-CM

## 2023-08-10 LAB — COMPREHENSIVE METABOLIC PANEL
ALT: 22 U/L (ref 0–44)
AST: 18 U/L (ref 15–41)
Albumin: 2.3 g/dL — ABNORMAL LOW (ref 3.5–5.0)
Alkaline Phosphatase: 55 U/L (ref 38–126)
Anion gap: 9 (ref 5–15)
BUN: 37 mg/dL — ABNORMAL HIGH (ref 8–23)
CO2: 24 mmol/L (ref 22–32)
Calcium: 7.5 mg/dL — ABNORMAL LOW (ref 8.9–10.3)
Chloride: 103 mmol/L (ref 98–111)
Creatinine, Ser: 1.95 mg/dL — ABNORMAL HIGH (ref 0.44–1.00)
GFR, Estimated: 24 mL/min — ABNORMAL LOW (ref >60)
Glucose, Bld: 110 mg/dL — ABNORMAL HIGH (ref 70–99)
Potassium: 3.3 mmol/L — ABNORMAL LOW (ref 3.5–5.1)
Sodium: 136 mmol/L (ref 135–145)
Total Bilirubin: 0.8 mg/dL (ref 0.0–1.2)
Total Protein: 4.4 g/dL — ABNORMAL LOW (ref 6.5–8.1)

## 2023-08-10 LAB — GLUCOSE, CAPILLARY
Glucose-Capillary: 103 mg/dL — ABNORMAL HIGH (ref 70–99)
Glucose-Capillary: 121 mg/dL — ABNORMAL HIGH (ref 70–99)
Glucose-Capillary: 119 mg/dL — ABNORMAL HIGH (ref 70–99)
Glucose-Capillary: 90 mg/dL (ref 70–99)

## 2023-08-10 LAB — GASTROINTESTINAL PANEL BY PCR, STOOL (REPLACES STOOL CULTURE)

## 2023-08-10 LAB — BRAIN NATRIURETIC PEPTIDE: B Natriuretic Peptide: >4500 pg/mL — ABNORMAL HIGH (ref 0.0–100.0)

## 2023-08-10 LAB — MAGNESIUM: Magnesium: 1.4 mg/dL — ABNORMAL LOW (ref 1.7–2.4)

## 2023-08-10 MED ORDER — LOPERAMIDE HCL 2 MG PO CAPS
4.0000 mg | ORAL_CAPSULE | Freq: Once | ORAL | Status: AC
  Administered 2023-08-10: 4 mg via ORAL
  Filled 2023-08-10: qty 2

## 2023-08-10 MED ORDER — POTASSIUM CHLORIDE CRYS ER 20 MEQ PO TBCR
40.0000 meq | EXTENDED_RELEASE_TABLET | ORAL | Status: AC
  Administered 2023-08-10 (×2): 40 meq via ORAL
  Filled 2023-08-10 (×2): qty 2

## 2023-08-10 MED ORDER — LOPERAMIDE HCL 2 MG PO CAPS
4.0000 mg | ORAL_CAPSULE | Freq: Four times a day (QID) | ORAL | Status: DC | PRN
  Administered 2023-08-10 – 2023-08-11 (×4): 4 mg via ORAL
  Filled 2023-08-10 (×4): qty 2

## 2023-08-10 MED ORDER — MAGNESIUM SULFATE 2 GM/50ML IV SOLN
2.0000 g | Freq: Once | INTRAVENOUS | Status: AC
  Administered 2023-08-10: 2 g via INTRAVENOUS
  Filled 2023-08-10: qty 50

## 2023-08-10 MED ORDER — CARVEDILOL 6.25 MG PO TABS
6.2500 mg | ORAL_TABLET | Freq: Two times a day (BID) | ORAL | Status: DC
  Administered 2023-08-10 – 2023-08-11 (×2): 6.25 mg via ORAL
  Filled 2023-08-10 (×2): qty 1

## 2023-08-10 NOTE — Assessment & Plan Note (Addendum)
08-10-2023 echo yesterday showed LVEF 25-30%. Back in July 2024, LVEF was 60-65%. BP is much improved since yesterday. On RA. [facility] cardiology consulted to assist. Don't she is volume overloaded currently.  PCXR today shows improved aeration since admission. She's had about 3.8 liters in urine output since admission 3 days ago. Baseline HR in the low 60s. On coreg 3.125 mg bid. Not much room to increase betablocker.  On hydralazine 50 mg tid and imdur 30 mg at bedtime. She has CKD stage 3b at baseline.  08-11-2023 no Entresto/ARB/ACEI/aldactone due to CKD stage 3b per my discussion with cardiology. F/u with EP/Cards in the office for PPM interrogation.  Dc to home with coreg 6.25 mg bid, hydralazine 50 mg tid, imdur 30 mg at bedtime, lasix 40 mg qam. Added Jardiance 10 mg qday.

## 2023-08-10 NOTE — Assessment & Plan Note (Signed)
08-10-2023 replete with po kcl. 08-11-2023 resolved.

## 2023-08-10 NOTE — Assessment & Plan Note (Signed)
08-10-2023 replete with IV mg 08-11-2023 give another 2 gram IV prior to DC.

## 2023-08-10 NOTE — Progress Notes (Signed)
PROGRESS NOTE    Leslie Parker  WUJ:[id number] DOB: Jan 14, 1933 DOA: 08/07/2023 PCP: [name], MD  Subjective: Pt seen and examined. Remains on RA RN reports pt stated she developed diarrhea in last 24 hours. Did not report this to me this AM.  Echo from yesterday shows decreased LVEF 25%. Cards consulted. On RA. Only trace to +1 pretibial/ankle edema. No pedal edema. Pt denies any SOB.  Walked with PT 70 feet yesterday.  Pt lives in a small apartment.   Hospital Course: HPI: Leslie Parker is a 89 y.o. female with medical history significant of chronic HFpEF, asthma, atrial fibrillation, stage III CKD type 2 diabetes, hypertension presented with acute respiratory failure with hypoxia, acute on chronic HFpEF and pneumonia.  Patient reports increased work of breathing over the past 1 to 2 days.  Positive cough.  Mild orthopnea PND.  No fevers or chills.  No chest pain.  No nausea or vomiting.  No focal hemiparesis or confusion.  Baseline medical issues including atrial fibrillation and type 2 diabetes.  No recent medication changes.  No reported recent sick contacts.  No reported alcohol or tobacco use. Presented to the ER afebrile, hemodynamically stable.  Satting in upper 80s on room air, transition to BiPAP and then ultimately 2 L nasal cannula to keep O2 sats greater than 93%.  White count 4.9, hemoglobin 12.6, platelets 204, VBG stable, creatinine 2.07, glucose 189, lactate 1.1.  COVID flu and RSV negative.  BNP of 4285 with baseline BNP around 1000.  Chest x-ray with CHF versus pulmonary edema as well as left retrocardiac pneumonia. EKG NSR.   Significant Events: Admitted 08/07/2023 for acute respiratory failure with hypoxia, pneumonia, acute on chronic diastolic CHF   Significant Labs: White count 4.9, hemoglobin 12.6, platelets 204, VBG stable, creatinine 2.07, glucose 189, lactate 1.1.  COVID flu and RSV negative.  BNP of 4285 with baseline BNP around 1000   Significant  Imaging Studies: Chest x-ray with CHF versus pulmonary edema as well as left retrocardiac pneumonia   Antibiotic Therapy: Anti-infectives (From admission, onward)    Start     Dose/Rate Route Frequency Ordered Stop   08/08/23 1000  cefTRIAXone (ROCEPHIN) 2 g in sodium chloride 0.9 % 100 mL IVPB        2 g 200 mL/hr over 30 Minutes Intravenous Every 24 hours 08/07/23 1626 08/12/23 0959   08/08/23 1000  azithromycin (ZITHROMAX) 500 mg in sodium chloride 0.9 % 250 mL IVPB        500 mg 250 mL/hr over 60 Minutes Intravenous Every 24 hours 08/07/23 1626 08/12/23 0959   08/07/23 1300  cefTRIAXone (ROCEPHIN) 2 g in sodium chloride 0.9 % 100 mL IVPB        2 g 200 mL/hr over 30 Minutes Intravenous Once 08/07/23 1250 08/07/23 1357   08/07/23 1300  azithromycin (ZITHROMAX) 500 mg in sodium chloride 0.9 % 250 mL IVPB        500 mg 250 mL/hr over 60 Minutes Intravenous  Once 08/07/23 1250 08/07/23 1503       Procedures:   Consultants:     Assessment and Plan: * Acute respiratory failure with hypoxia (HCC) On admission, pt was diagnosed with Pneumonia, Acute on chronic HFpEF. Decompensated resp failure requiring 2 L and cannula on presentation. Likely secondary to overlapping pneumonia as well as acute on chronic HFpEF. BNP 4285 which is well above baseline. Chest x-ray with CHF and left retrocardiac pneumonia. IV Lasix. IV Rocephin azithromycin for  infectious coverage. Blood and respiratory cultures.Monitor respiratory status appropriately. 08-08-2023 pt has been weaned to RA.  Pt remains on IV lasix. Pt has normal WBC and no fevers.  Have ordered procalcitonin but just found out that lab machine for procalcitonin is broken. I do not strongly feel she has pneumonia given how fast her hypoxia has cleared. Will leave abx going for 1 more day. Hopefully procalcitonin lab will be able to be run tomorrow. I think her acute respiratory failure with hypoxia was due to pulmonary edema from acute on  chronic diastolic CHF. 08-09-2023 hypoxia has resolved. Due to CHF, not pneumonia. Pneumonia has been rule out. Stop abx.  08-10-2023 diarrhea started in ER. Could be from abx that were started in ER. However, given recent outbreak of Nori virus, will check diarrhea panel.  Acute combined systolic (congestive) and diastolic (congestive) heart failure (HCC) - LVEF 25% 08-10-2023 echo yesterday showed LVEF 25-30%. Back in July 2024, LVEF was 60-65%. BP is much improved since yesterday. On RA. [facility] cardiology consulted to assist. Don't she is volume overloaded currently.  PCXR today shows improved aeration since admission. She's had about 3.8 liters in urine output since admission 3 days ago. Baseline HR in the low 60s. On coreg 3.125 mg bid. Not much room to increase betablocker.  On hydralazine 50 mg tid and imdur 30 mg at bedtime. She has CKD stage 3b at baseline.  Acute on chronic diastolic CHF (congestive heart failure) (HCC) On admission, 2D echo July 2024 with EF 60 to 65% and grade 1 diastolic dysfunction. BNP 4285 today and CHF pattern. S/p 20mg  IV lasix in the ER. Will acutely increase home lasix 20mg  daily-->40mg  daily. Weight today 88kg. Strict Is and Os and daily weights. Cardiology consult as clinically indicated.  08-08-2023 continue with lasix 40 mg IV q12h. Continue with coreg 3.125 mg bid.  Add Jardiance 10 mg qday.  Pt denies any hx of frequent UTIs. Monitor Scr. Was seen by outpt cardiology in 06-2023. No mention as to why pt is not on GDMT. Specifically not on Entresto.  Possibly due to CKD stage 3b. Will need referral back to cards clinic for them to decide if this is appropriate for patient. 08-09-2023 awaiting labs. Possible change to po lasix.  08-10-2023 lasix stopped yesterday due to rising Scr. Pt looks euvolemic now. Only trace pretibial edema. LVEF decreased now to 25%. [facility] cards consulted today.  Type II diabetes mellitus with renal manifestations (HCC) On  admission, Blood sugar 180s. SSI. A1C. Monitor  08-08-2023 will order A1C. CBG acceptable ranges. Continue with SSI. Add Jardiance for additional diuretic effect. Should also help with CBG. 08-09-2023 stable. CBG acceptable ranges.  08-10-2023 stable. CBG acceptable   Sick sinus syndrome (HCC) Chronic PPM  COPD (chronic obstructive pulmonary disease) (HCC) On admission, Appears fairly stable from a respiratory standpoint. Cont prn inhalers  08-08-2023 stable. Not exacerbated. 08-09-2023 stable.   08-10-2023 stable.  Chronic a-fib (HCC) On admission, Rate controlled at present. Cont home regimen including amiodarone and eliquis  08-08-2023 stable. Continue coreg 3.125 mg bid, eliquis 5 mg bid, amiodarone 200 mg daily.  EKG on admission showed NSR. 08-09-2023 stable.  08-10-2023 stable.   Stage 3b chronic kidney disease (HCC) - Baseline scr 1.8-2.0 On admission, Cr 2 w/ GFR in 20s. Appears near baseline. Monitor  08-08-2023 Scr 1.84, BUN 39 with diuresis. Continue to monitor during diuresis. Baseline scr 1.8-2.0 08-09-2023 awaiting AM labs.  08-10-2023 Scr up to 1.95 with diuresis. Last dose  of Lasix yesterday around 10 AM.  Essential hypertension 08-08-2023 on coreg 3.125 mg bid. Added IV lasix 40 mg bid. May need additional BP control. 08-09-2023 needs additional BP control. Given CKD stage 3b, will add hydralazine 50 mg tid. HR in low 60s on coreg 3.125 mg bid.  08-10-2023 BP has improved. Currently on coreg 3.125 mg bid(limited to increasing due to baseline HR of low 60s), hydralazine 50 mg tid, Imdur 30 mg at bedtime.  Hypokalemia 08-10-2023 replete with po kcl.  Hypomagnesemia 08-10-2023 replete with IV mg   DVT prophylaxis: apixaban (ELIQUIS) tablet 2.5 mg Start: 08/07/23 2200 apixaban (ELIQUIS) tablet 2.5 mg     Code Status: Full Code Family Communication: no family at bedside Disposition Plan: return home Reason for continuing need for hospitalization:  cardiology consulted today for decreased EF seen on echo.  Objective: Vitals:   08/10/23 0048 08/10/23 0345 08/10/23 0531 08/10/23 0826  BP: 121/75 113/71 139/68 (!) 143/69  Pulse:    61  Resp:  18  18  Temp:  97.7 F (36.5 C)  97.7 F (36.5 C)  TempSrc:  Oral    SpO2:  96%  96%  Weight:      Height:        Intake/Output Summary (Last 24 hours) at 08/10/2023 0907 Last data filed at 08/09/2023 2104 Gross per 24 hour  Intake 240 ml  Output 1350 ml  Net -1110 ml   Filed Weights   08/07/23 1242  Weight: 88.2 kg    Examination:  Physical Exam Vitals and nursing note reviewed.  Constitutional:      General: She is not in acute distress.    Appearance: She is not toxic-appearing or diaphoretic.  HENT:     Head: Normocephalic and atraumatic.     Nose: Nose normal.  Eyes:     General: No scleral icterus. Cardiovascular:     Rate and Rhythm: Normal rate and regular rhythm.  Pulmonary:     Effort: Pulmonary effort is normal. No respiratory distress.     Breath sounds: Normal breath sounds. No wheezing or rales.  Abdominal:     General: Bowel sounds are normal. There is no distension.     Palpations: Abdomen is soft.  Musculoskeletal:     Comments: Trace to +1 pitting pretibial edema and ankle edema bilaterally No pedal edema  Skin:    General: Skin is warm and dry.     Capillary Refill: Capillary refill takes less than 2 seconds.  Neurological:     Mental Status: She is alert and oriented to person, place, and time.     Data Reviewed: I have personally reviewed following labs and imaging studies  CBC: Recent Labs  Lab 08/07/23 1248 08/08/23 0410  WBC 4.9 4.6  NEUTROABS 3.7  --   HGB 12.6 11.0*  HCT 38.0 35.8*  MCV 91.6 96.5  PLT 204 182   Basic Metabolic Panel: Recent Labs  Lab 08/07/23 1248 08/08/23 0410 08/09/23 1229 08/10/23 0400  NA 136 137 138 136  K 3.9 3.5 3.4* 3.3*  CL 104 106 101 103  CO2 20* 19* 25 24  GLUCOSE 189* 111* 109* 110*  BUN  43* 39* 38* 37*  CREATININE 2.07* 1.84* 1.98* 1.95*  CALCIUM 8.3* 7.8* 8.1* 7.5*  MG  --   --   --  1.4*   GFR: Estimated Creatinine Clearance: 23.6 mL/min (A) (by C-G formula based on SCr of 1.95 mg/dL (H)). Liver Function Tests: Recent Labs  Lab  08/07/23 1248 08/08/23 0410 08/09/23 1229 08/10/23 0400  AST 42* 27 21 18   ALT 41 28 26 22   ALKPHOS 80 63 68 55  BILITOT 1.0 1.0 0.9 0.8  PROT 6.0* 4.9* 5.3* 4.4*  ALBUMIN 3.3* 2.5* 2.8* 2.3*   Coagulation Profile: Recent Labs  Lab 08/07/23 1248  INR 1.1   BNP (last 3 results) Recent Labs    06/16/23 0912 08/07/23 1248 08/10/23 0400  BNP 1,602.3* 4,285.0* >4,500.0*   HbA1C: Recent Labs    08/08/23 0410  HGBA1C 7.0*   CBG: Recent Labs  Lab 08/09/23 0945 08/09/23 1138 08/09/23 1509 08/09/23 1953 08/10/23 0828  GLUCAP 110* 110* 142* 151* 103*   Sepsis Labs: Recent Labs  Lab 08/07/23 1248 08/08/23 0903  PROCALCITON  --  <0.10  LATICACIDVEN 1.1  --     Recent Results (from the past 240 hours)  Culture, blood (Routine x 2)     Status: None (Preliminary result)   Collection Time: 08/07/23 12:48 PM   Specimen: BLOOD  Result Value Ref Range Status   Specimen Description BLOOD BLOOD RIGHT ARM  Final   Special Requests   Final    BOTTLES DRAWN AEROBIC ONLY Blood Culture results may not be optimal due to an inadequate volume of blood received in culture bottles   Culture   Final    NO GROWTH 3 DAYS Performed at [facility], [address], [place], [place] [address]    Report Status PENDING  Incomplete  Resp panel by RT-PCR (RSV, Flu A&B, Covid) Anterior Nasal Swab     Status: None   Collection Time: 08/07/23 12:48 PM   Specimen: Anterior Nasal Swab  Result Value Ref Range Status   SARS Coronavirus 2 by RT PCR NEGATIVE NEGATIVE Final    Comment: (NOTE) SARS-CoV-2 target nucleic acids are NOT DETECTED.  The SARS-CoV-2 RNA is generally detectable in upper respiratory specimens during the acute  phase of infection. The lowest concentration of SARS-CoV-2 viral copies this assay can detect is 138 copies/mL. A negative result does not preclude SARS-Cov-2 infection and should not be used as the sole basis for treatment or other patient management decisions. A negative result may occur with  improper specimen collection/handling, submission of specimen other than nasopharyngeal swab, presence of viral mutation(s) within the areas targeted by this assay, and inadequate number of viral copies(<138 copies/mL). A negative result must be combined with clinical observations, patient history, and epidemiological information. The expected result is Negative.  Fact Sheet for Patients:  BloggerCourse.com  Fact Sheet for Healthcare Providers:  SeriousBroker.it  This test is no t yet approved or cleared by the [place] FDA and  has been authorized for detection and/or diagnosis of SARS-CoV-2 by FDA under an Emergency Use Authorization (EUA). This EUA will remain  in effect (meaning this test can be used) for the duration of the COVID-19 declaration under Section 564(b)(1) of the Act, 21 U.S.C.section 360bbb-3(b)(1), unless the authorization is terminated  or revoked sooner.       Influenza A by PCR NEGATIVE NEGATIVE Final   Influenza B by PCR NEGATIVE NEGATIVE Final    Comment: (NOTE) The Xpert Xpress SARS-CoV-2/FLU/RSV plus assay is intended as an aid in the diagnosis of influenza from Nasopharyngeal swab specimens and should not be used as a sole basis for treatment. Nasal washings and aspirates are unacceptable for Xpert Xpress SARS-CoV-2/FLU/RSV testing.  Fact Sheet for Patients: BloggerCourse.com  Fact Sheet for Healthcare Providers: SeriousBroker.it  This test is not  yet approved or cleared by the [place] and has been authorized for detection and/or diagnosis of  SARS-CoV-2 by FDA under an Emergency Use Authorization (EUA). This EUA will remain in effect (meaning this test can be used) for the duration of the COVID-19 declaration under Section 564(b)(1) of the Act, 21 U.S.C. section 360bbb-3(b)(1), unless the authorization is terminated or revoked.     Resp Syncytial Virus by PCR NEGATIVE NEGATIVE Final    Comment: (NOTE) Fact Sheet for Patients: BloggerCourse.com  Fact Sheet for Healthcare Providers: SeriousBroker.it  This test is not yet approved or cleared by the [place] FDA and has been authorized for detection and/or diagnosis of SARS-CoV-2 by FDA under an Emergency Use Authorization (EUA). This EUA will remain in effect (meaning this test can be used) for the duration of the COVID-19 declaration under Section 564(b)(1) of the Act, 21 U.S.C. section 360bbb-3(b)(1), unless the authorization is terminated or revoked.  Performed at [facility], [address] [place], [place] [address]   Culture, blood (Routine x 2)     Status: None (Preliminary result)   Collection Time: 08/07/23  1:17 PM   Specimen: BLOOD  Result Value Ref Range Status   Specimen Description BLOOD BLOOD LEFT ARM  Final   Special Requests   Final    BOTTLES DRAWN AEROBIC AND ANAEROBIC Blood Culture results may not be optimal due to an inadequate volume of blood received in culture bottles   Culture   Final    NO GROWTH 3 DAYS Performed at [facility], [address] [place], [place] [address]    Report Status PENDING  Incomplete     Radiology Studies: ECHOCARDIOGRAM COMPLETE Result Date: 08/09/2023    ECHOCARDIOGRAM REPORT   Patient Name:   Leslie Parker Date of Exam: 08/09/2023 Medical Rec #:  [id number]     Height:       69.0 in Accession #:    [id number]    Weight:       194.5 lb Date of Birth:  September 30, 1932      BSA:          2.042 m Patient Age:    89 years      BP:           172/81  mmHg Patient Gender: F             HR:           62 bpm. Exam Location:  [facility] Procedure: 2D Echo, 3D Echo, Cardiac Doppler and Color Doppler Indications:     CHF  History:         Patient has prior history of Echocardiogram examinations, most                  recent 02/09/2023. CHF, Abnormal ECG, COPD, Arrythmias:Atrial                  Fibrillation, Signs/Symptoms:Dizziness/Lightheadedness; Risk                  Factors:Hypertension and Diabetes. CKD.  Sonographer:     Mikki Harbor Referring Phys:  [id number] Alyxandra Tenbrink Diagnosing Phys: Debbe Odea MD IMPRESSIONS  1. Left ventricular ejection fraction, by estimation, is 25 to 30%. Left ventricular ejection fraction by 2D MOD biplane is 26.6 %. The left ventricle has severely decreased function. The left ventricle demonstrates global hypokinesis. There is mild left ventricular hypertrophy. Left ventricular diastolic parameters are indeterminate.  2. Right ventricular systolic  function is normal. The right ventricular size is normal. There is mildly elevated pulmonary artery systolic pressure.  3. Left atrial size was moderately dilated.  4. The mitral valve is degenerative. Moderate mitral valve regurgitation.  5. Tricuspid valve regurgitation is mild to moderate.  6. The aortic valve is calcified. Aortic valve regurgitation is not visualized. Aortic valve sclerosis/calcification is present, without any evidence of aortic stenosis. Aortic valve mean gradient measures 8.5 mmHg.  7. The inferior vena cava is dilated in size with >50% respiratory variability, suggesting right atrial pressure of 8 mmHg. FINDINGS  Left Ventricle: Left ventricular ejection fraction, by estimation, is 25 to 30%. Left ventricular ejection fraction by 2D MOD biplane is 26.6 %. The left ventricle has severely decreased function. The left ventricle demonstrates global hypokinesis. The left ventricular internal cavity size was normal in size. There is mild left ventricular hypertrophy. Left  ventricular diastolic parameters are indeterminate. Right Ventricle: The right ventricular size is normal. No increase in right ventricular wall thickness. Right ventricular systolic function is normal. There is mildly elevated pulmonary artery systolic pressure. The tricuspid regurgitant velocity is 2.81  m/s, and with an assumed right atrial pressure of 8 mmHg, the estimated right ventricular systolic pressure is 39.6 mmHg. Left Atrium: Left atrial size was moderately dilated. Right Atrium: Right atrial size was normal in size. Pericardium: There is no evidence of pericardial effusion. Mitral Valve: The mitral valve is degenerative in appearance. Moderate mitral valve regurgitation. MV peak gradient, 8.4 mmHg. The mean mitral valve gradient is 3.0 mmHg. Tricuspid Valve: The tricuspid valve is normal in structure. Tricuspid valve regurgitation is mild to moderate. Aortic Valve: The aortic valve is calcified. Aortic valve regurgitation is not visualized. Aortic valve sclerosis/calcification is present, without any evidence of aortic stenosis. Aortic valve mean gradient measures 8.5 mmHg. Aortic valve peak gradient measures 18.0 mmHg. Pulmonic Valve: The pulmonic valve was not well visualized. Pulmonic valve regurgitation is not visualized. Aorta: The aortic root is normal in size and structure. Venous: The inferior vena cava is dilated in size with greater than 50% respiratory variability, suggesting right atrial pressure of 8 mmHg. IAS/Shunts: No atrial level shunt detected by color flow Doppler.  LEFT VENTRICLE PLAX 2D                        Biplane EF (MOD) LVIDd:         5.00 cm         LV Biplane EF:   Left LVIDs:         3.70 cm                          ventricular LV PW:         1.20 cm                          ejection LV IVS:        1.50 cm                          fraction by                                                 2D MOD  biplane is LV Volumes (MOD)                                 26.6 %. LV vol d, MOD    108.0 ml A2C:                           Diastology LV vol d, MOD    105.0 ml      LV e' medial:    3.48 cm/s A4C:                           LV E/e' medial:  19.4 LV vol s, MOD    79.4 ml       LV e' lateral:   9.79 cm/s A2C:                           LV E/e' lateral: 6.9 LV vol s, MOD    76.4 ml A4C: LV SV MOD A2C:   28.6 ml LV SV MOD A4C:   105.0 ml LV SV MOD BP:    28.3 ml RIGHT VENTRICLE RV Basal diam:  3.55 cm RV Mid diam:    3.00 cm RV S prime:     23.70 cm/s TAPSE (M-mode): 1.9 cm LEFT ATRIUM             Index        RIGHT ATRIUM           Index LA diam:        4.10 cm 2.01 cm/m   RA Area:     15.10 cm LA Vol (A2C):   87.3 ml 42.76 ml/m  RA Volume:   41.20 ml  20.18 ml/m LA Vol (A4C):   86.1 ml 42.17 ml/m LA Biplane Vol: 89.0 ml 43.59 ml/m  AORTIC VALVE                    PULMONIC VALVE AV Vmax:           212.00 cm/s  PV Vmax:       1.04 m/s AV Vmean:          127.500 cm/s PV Peak grad:  4.3 mmHg AV VTI:            0.420 m AV Peak Grad:      18.0 mmHg AV Mean Grad:      8.5 mmHg LVOT Vmax:         83.00 cm/s LVOT Vmean:        48.900 cm/s LVOT VTI:          0.173 m LVOT/AV VTI ratio: 0.41 MITRAL VALVE                TRICUSPID VALVE MV Area (PHT): 6.02 cm     TR Peak grad:   31.6 mmHg MV Peak grad:  8.4 mmHg     TR Vmax:        281.00 cm/s MV Mean grad:  3.0 mmHg MV Vmax:       1.45 m/s     SHUNTS MV Vmean:      75.6 cm/s    Systemic VTI: 0.17 m MV Decel Time: 126 msec MV E velocity: 67.60 cm/s MV A velocity: 124.00 cm/s MV E/A ratio:  0.55 Debbe Odea MD Electronically  signed by Debbe Odea MD Signature Date/Time: 08/09/2023/4:34:38 PM    Final     Scheduled Meds:  amiodarone  200 mg Oral Daily   apixaban  2.5 mg Oral BID   carvedilol  3.125 mg Oral BID WC   empagliflozin  10 mg Oral Daily   hydrALAZINE  50 mg Oral Q8H   insulin aspart  0-9 Units Subcutaneous TID WC   isosorbide mononitrate  30 mg Oral QHS   potassium chloride   40 mEq Oral Q4H   Continuous Infusions:  magnesium sulfate bolus IVPB       LOS: 3 days   Time spent: 40 minutes  Carollee Herter, DO  [facility]  08/10/2023, 9:07 AM

## 2023-08-10 NOTE — Plan of Care (Signed)

## 2023-08-10 NOTE — Consult Note (Signed)
[facility] Cardiology  CARDIOLOGY CONSULT NOTE  Patient ID: Leslie Parker MRN: [id number] DOB/AGE: 1933-05-01 89 y.o.  Admit date: 08/07/2023 Referring Physician Dr. Imogene Burn Primary Physician [name], MD Primary Cardiologist Dr. Juliann Pares Reason for Consultation severe LV dysfunction  HPI: Past medical history significant for heart failure with preserved EF, sick sinus syndrome status post [facility] Scientific dual-chamber pacemaker 10/2022, paroxysmal atrial fibrillation, diabetes, COPD who presented to hospital with shortness of breath, acute hypoxic respiratory failure.  Being managed for heart failure with diuresis and also for pneumonia.  Echo yesterday showed new reduced LVEF of 25 to 30% with mid to apical severe hypokinesis with somewhat preserved function and base on my review.  Moderate mitral regurgitation.  Our service is consulted for new reduced LVEF.  Patient feels her breathing has improved.  Having mild abdominal uneasiness and noted diarrhea in last day.  No complaints of chest pain/pressure or increased shortness of breath.  No palpitation, dizziness or syncope.  EKG showed sinus rhythm with left bundle branch block, unclear if it is V-paced.  On monitor patient is currently in V paced rhythm.  Last pacer interrogation 01/2023 with 58% RV pacing  Review of systems complete and found to be negative unless listed above     Past Medical History:  Diagnosis Date   Acute kidney injury superimposed on CKD (HCC) 11/24/2022   Asthma    Atrial fibrillation with RVR (HCC)    Cancer (HCC)    Basal Cell   Closed left hip fracture (HCC) 01/25/2017   Closed right hip fracture (HCC) 02/13/2023   Diabetes mellitus without complication (HCC)    Femur fracture, left (HCC) 02/03/2015   Heart murmur    Hypertension    Impetigo    Osteoarthritis    Osteopenia    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   Rapid atrial fibrillation, new onset(HCC) 06/05/2022   Vomiting    can not due to surgery    Wears dentures    full upper and lower    Past Surgical History:  Procedure Laterality Date   ABDOMINAL HYSTERECTOMY     BACK SURGERY     BLADDER SURGERY     mesh   CARPAL TUNNEL RELEASE Bilateral    CATARACT EXTRACTION Right 2017   CATARACT EXTRACTION W/PHACO Left 02/06/2016   Procedure: CATARACT EXTRACTION PHACO AND INTRAOCULAR LENS PLACEMENT (IOC) left eye;  Surgeon: Sherald Hess, MD;  Location: [facility];  Service: Ophthalmology;  Laterality: Left;  DIABETIC LEFT Cannot arrive before 9:30   CERVICAL DISC SURGERY     CHOLECYSTECTOMY     EYE SURGERY Bilateral    Cataract Extraction with IOL   FEMUR IM NAIL Left 02/04/2015   Procedure: INTRAMEDULLARY (IM) NAIL FEMORAL;  Surgeon: Juanell Fairly, MD;  Location: [facility];  Service: Orthopedics;  Laterality: Left;   HARDWARE REMOVAL Left 01/17/2017   Procedure: HARDWARE REMOVAL;  Surgeon: Juanell Fairly, MD;  Location: [facility];  Service: Orthopedics;  Laterality: Left;   HERNIA REPAIR  2014   esophageal and gastric mesh. patient unable to throw up d/t mesh   HIP ARTHROPLASTY Left 01/26/2017   Procedure: ARTHROPLASTY BIPOLAR HIP (HEMIARTHROPLASTY) removal hardware left hip;  Surgeon: Deeann Saint, MD;  Location: [facility];  Service: Orthopedics;  Laterality: Left;   INTRAMEDULLARY (IM) NAIL INTERTROCHANTERIC Right 02/13/2023   Procedure: INTRAMEDULLARY (IM) NAIL INTERTROCHANTERIC;  Surgeon: Christena Flake, MD;  Location: [facility];  Service: Orthopedics;  Laterality: Right;   JOINT REPLACEMENT Bilateral  knees   PACEMAKER IMPLANT N/A 11/15/2022   Procedure: PACEMAKER IMPLANT;  Surgeon: Marcina Millard, MD;  Location: [facility];  Service: Cardiovascular;  Laterality: N/A;   PACEMAKER IMPLANT N/A 11/28/2022   Procedure: PACEMAKER IMPLANT;  Surgeon: Marcina Millard, MD;  Location: [facility];  Service: Cardiovascular;  Laterality: N/A;  Lead reposition   REPLACEMENT TOTAL KNEE BILATERAL  Bilateral [id number]   SHOULDER ARTHROSCOPY WITH OPEN ROTATOR CUFF REPAIR AND DISTAL CLAVICLE ACROMINECTOMY Left 10/25/2016   Procedure: SHOULDER ARTHROSCOPY WITH OPEN ROTATOR CUFF REPAIR AND DISTAL CLAVICLE ACROMINECTOMY;  Surgeon: Juanell Fairly, MD;  Location: [facility];  Service: Orthopedics;  Laterality: Left;   THYROID SURGERY     goiter removed   TOTAL HIP REVISION Left 12/02/2017   Procedure: TOTAL HIP REVISION;  Surgeon: Lyndle Herrlich, MD;  Location: [facility];  Service: Orthopedics;  Laterality: Left;   TOTAL SHOULDER REPLACEMENT Right 2012    Medications Prior to Admission  Medication Sig Dispense Refill Last Dose/Taking   albuterol (VENTOLIN HFA) 108 (90 Base) MCG/ACT inhaler Inhale 2 puffs into the lungs every 6 (six) hours as needed.   Taking As Needed   amiodarone (PACERONE) 200 MG tablet Take 200 mg by mouth daily.   08/06/2023   apixaban (ELIQUIS) 2.5 MG TABS tablet Take 1 tablet (2.5 mg total) by mouth 2 (two) times daily. 60 tablet 1 08/06/2023   ascorbic acid (VITAMIN C) 250 MG CHEW Chew 250 mg by mouth daily.   08/06/2023   carvedilol (COREG) 3.125 MG tablet Take 1 tablet (3.125 mg total) by mouth 2 (two) times daily with a meal. 60 tablet 1 08/06/2023   Cholecalciferol (VITAMIN D-1000 MAX ST) 25 MCG (1000 UT) tablet Take 1,000 Units by mouth daily.   08/06/2023   colestipol (COLESTID) 1 g tablet Take 1 tablet by mouth 2 (two) times daily.   08/06/2023   Cyanocobalamin (B-12) 2500 MCG TABS Take 2,500 mcg by mouth daily.   08/06/2023   diltiazem (CARDIZEM CD) 360 MG 24 hr capsule Take 360 mg by mouth daily.   08/06/2023   furosemide (LASIX) 20 MG tablet Take 1 tablet (20 mg total) by mouth daily. 30 tablet 1 08/06/2023   glipiZIDE (GLUCOTROL XL) 2.5 MG 24 hr tablet Take 2.5 mg by mouth daily with breakfast.   08/06/2023   hydrALAZINE (APRESOLINE) 25 MG tablet Take 25 mg by mouth 3 (three) times daily as needed. Take 1 tablet (25 mg total) by mouth 3 (three) times daily as needed (for SBP  >150 conistently or DBP>100 persistently)   Taking As Needed   ipratropium-albuterol (DUONEB) 0.5-2.5 (3) MG/3ML SOLN Inhale 3 mLs into the lungs every 6 (six) hours as needed.   Taking As Needed   isosorbide mononitrate (IMDUR) 30 MG 24 hr tablet Take 1 tablet (30 mg total) by mouth daily. 30 tablet 1 08/06/2023   Magnesium Oxide 500 MG CAPS Take 500 mg by mouth daily.   08/06/2023   metoprolol tartrate (LOPRESSOR) 25 MG tablet Take 25 mg by mouth 2 (two) times daily.   08/06/2023   Multiple Vitamin (MULTIVITAMIN WITH MINERALS) TABS tablet Take 1 tablet by mouth daily. One-A-Day Women's Vitamin   08/06/2023   omeprazole (PRILOSEC) 20 MG capsule Take 20 mg by mouth every morning.   08/06/2023   ondansetron (ZOFRAN-ODT) 4 MG disintegrating tablet Take 4 mg by mouth every 8 (eight) hours as needed.   Taking As Needed   traMADol (ULTRAM) 50 MG tablet Take 1  tablet by mouth every 6 (six) hours as needed.   Taking As Needed   traZODone (DESYREL) 100 MG tablet Take 100 mg by mouth at bedtime.   08/06/2023   TRELEGY ELLIPTA 100-62.5-25 MCG/ACT AEPB Inhale 1 puff into the lungs daily.   08/07/2023   Social History   Socioeconomic History   Marital status: Divorced    Spouse name: Not on file   Number of children: 4   Years of education: Not on file   Highest education level: 12th grade  Occupational History   Occupation: Retitred/Disability  Tobacco Use   Smoking status: Never   Smokeless tobacco: Never  Vaping Use   Vaping status: Never Used  Substance and Sexual Activity   Alcohol use: No    Alcohol/week: 0.0 standard drinks of alcohol   Drug use: No   Sexual activity: Never  Other Topics Concern   Not on file  Social History Narrative   Not on file   Social Drivers of Health   Financial Resource Strain: Low Risk  (06/06/2023)   Overall Financial Resource Strain (CARDIA)    Difficulty of Paying Living Expenses: Not hard at all  Food Insecurity: No Food Insecurity (08/09/2023)   Hunger  Vital Sign    Worried About Running Out of Food in the Last Year: Never true    Ran Out of Food in the Last Year: Never true  Transportation Needs: No Transportation Needs (08/09/2023)   PRAPARE - Administrator, Civil Service (Medical): No    Lack of Transportation (Non-Medical): No  Physical Activity: Not on file  Stress: Not on file  Social Connections: Moderately Integrated (08/09/2023)   Social Connection and Isolation Panel [NHANES]    Frequency of Communication with Friends and Family: Twice a week    Frequency of Social Gatherings with Friends and Family: Three times a week    Attends Religious Services: 1 to 4 times per year    Active Member of Clubs or Organizations: No    Attends Banker Meetings: 1 to 4 times per year    Marital Status: Divorced  Catering manager Violence: Not At Risk (08/09/2023)   Humiliation, Afraid, Rape, and Kick questionnaire    Fear of Current or Ex-Partner: No    Emotionally Abused: No    Physically Abused: No    Sexually Abused: No    Family History  Problem Relation Age of Onset   Heart failure Mother    Hypertension Mother    Emphysema Father       Review of systems complete and found to be negative unless listed above      PHYSICAL EXAM  Alert and oriented x 3. No pedal edema No wheeze or crackles Systolic murmur mitral area/left lower sternal border   Labs:   Lab Results  Component Value Date   WBC 4.6 08/08/2023   HGB 11.0 (L) 08/08/2023   HCT 35.8 (L) 08/08/2023   MCV 96.5 08/08/2023   PLT 182 08/08/2023    Recent Labs  Lab 08/10/23 0400  NA 136  K 3.3*  CL 103  CO2 24  BUN 37*  CREATININE 1.95*  CALCIUM 7.5*  PROT 4.4*  BILITOT 0.8  ALKPHOS 55  ALT 22  AST 18  GLUCOSE 110*   Lab Results  Component Value Date   CKTOTAL 93 02/13/2023   TROPONINI < 0.02 07/13/2014   No results found for: "CHOL" No results found for: "HDL" No results found for: "LDLCALC"  No results found for:  "TRIG" No results found for: "CHOLHDL" No results found for: "LDLDIRECT"    Radiology: [facility] Chest Port 1 View Result Date: 08/10/2023 CLINICAL DATA:  89 year old female history of acute on chronic diastolic congestive heart failure. EXAM: PORTABLE CHEST 1 VIEW COMPARISON:  Chest x-ray 08/07/2023. FINDINGS: Left-sided pacemaker device in place with lead tips projecting over the expected location of the right atrium and right ventricle. Lung volumes are normal. Bibasilar opacities are noted (left-greater-than-right) which may reflect areas of atelectasis and/or consolidation. Small right and moderate left pleural effusions. Diffuse interstitial prominence and peribronchial cuffing. Cephalization of the pulmonary vasculature. Mild cardiomegaly. Upper mediastinal contours are within normal limits. Atherosclerotic calcifications are noted in the thoracic aorta. Status post right shoulder arthroplasty. IMPRESSION: 1. The appearance the chest is most compatible with acute congestive heart failure, as above. 2. Aortic atherosclerosis. Electronically Signed   By: Trudie Reed M.D.   On: 08/10/2023 11:33   ECHOCARDIOGRAM COMPLETE Result Date: 08/09/2023    ECHOCARDIOGRAM REPORT   Patient Name:   Leslie Parker Date of Exam: 08/09/2023 Medical Rec #:  [id number]     Height:       69.0 in Accession #:    [id number]    Weight:       194.5 lb Date of Birth:  1932-10-04      BSA:          2.042 m Patient Age:    89 years      BP:           172/81 mmHg Patient Gender: F             HR:           62 bpm. Exam Location:  [facility] Procedure: 2D Echo, 3D Echo, Cardiac Doppler and Color Doppler Indications:     CHF  History:         Patient has prior history of Echocardiogram examinations, most                  recent 02/09/2023. CHF, Abnormal ECG, COPD, Arrythmias:Atrial                  Fibrillation, Signs/Symptoms:Dizziness/Lightheadedness; Risk                  Factors:Hypertension and Diabetes. CKD.  Sonographer:     Mikki Harbor Referring Phys:  [id number] ERIC CHEN Diagnosing Phys: Debbe Odea MD IMPRESSIONS  1. Left ventricular ejection fraction, by estimation, is 25 to 30%. Left ventricular ejection fraction by 2D MOD biplane is 26.6 %. The left ventricle has severely decreased function. The left ventricle demonstrates global hypokinesis. There is mild left ventricular hypertrophy. Left ventricular diastolic parameters are indeterminate.  2. Right ventricular systolic function is normal. The right ventricular size is normal. There is mildly elevated pulmonary artery systolic pressure.  3. Left atrial size was moderately dilated.  4. The mitral valve is degenerative. Moderate mitral valve regurgitation.  5. Tricuspid valve regurgitation is mild to moderate.  6. The aortic valve is calcified. Aortic valve regurgitation is not visualized. Aortic valve sclerosis/calcification is present, without any evidence of aortic stenosis. Aortic valve mean gradient measures 8.5 mmHg.  7. The inferior vena cava is dilated in size with >50% respiratory variability, suggesting right atrial pressure of 8 mmHg. FINDINGS  Left Ventricle: Left ventricular ejection fraction, by estimation, is 25 to 30%. Left ventricular ejection fraction by 2D MOD biplane is 26.6 %. The left  ventricle has severely decreased function. The left ventricle demonstrates global hypokinesis. The left ventricular internal cavity size was normal in size. There is mild left ventricular hypertrophy. Left ventricular diastolic parameters are indeterminate. Right Ventricle: The right ventricular size is normal. No increase in right ventricular wall thickness. Right ventricular systolic function is normal. There is mildly elevated pulmonary artery systolic pressure. The tricuspid regurgitant velocity is 2.81  m/s, and with an assumed right atrial pressure of 8 mmHg, the estimated right ventricular systolic pressure is 39.6 mmHg. Left Atrium: Left atrial size was moderately dilated.  Right Atrium: Right atrial size was normal in size. Pericardium: There is no evidence of pericardial effusion. Mitral Valve: The mitral valve is degenerative in appearance. Moderate mitral valve regurgitation. MV peak gradient, 8.4 mmHg. The mean mitral valve gradient is 3.0 mmHg. Tricuspid Valve: The tricuspid valve is normal in structure. Tricuspid valve regurgitation is mild to moderate. Aortic Valve: The aortic valve is calcified. Aortic valve regurgitation is not visualized. Aortic valve sclerosis/calcification is present, without any evidence of aortic stenosis. Aortic valve mean gradient measures 8.5 mmHg. Aortic valve peak gradient measures 18.0 mmHg. Pulmonic Valve: The pulmonic valve was not well visualized. Pulmonic valve regurgitation is not visualized. Aorta: The aortic root is normal in size and structure. Venous: The inferior vena cava is dilated in size with greater than 50% respiratory variability, suggesting right atrial pressure of 8 mmHg. IAS/Shunts: No atrial level shunt detected by color flow Doppler.  LEFT VENTRICLE PLAX 2D                        Biplane EF (MOD) LVIDd:         5.00 cm         LV Biplane EF:   Left LVIDs:         3.70 cm                          ventricular LV PW:         1.20 cm                          ejection LV IVS:        1.50 cm                          fraction by                                                 2D MOD                                                 biplane is LV Volumes (MOD)                                26.6 %. LV vol d, MOD    108.0 ml A2C:                           Diastology LV vol d, MOD  105.0 ml      LV e' medial:    3.48 cm/s A4C:                           LV E/e' medial:  19.4 LV vol s, MOD    79.4 ml       LV e' lateral:   9.79 cm/s A2C:                           LV E/e' lateral: 6.9 LV vol s, MOD    76.4 ml A4C: LV SV MOD A2C:   28.6 ml LV SV MOD A4C:   105.0 ml LV SV MOD BP:    28.3 ml RIGHT VENTRICLE RV Basal diam:  3.55 cm RV Mid  diam:    3.00 cm RV S prime:     23.70 cm/s TAPSE (M-mode): 1.9 cm LEFT ATRIUM             Index        RIGHT ATRIUM           Index LA diam:        4.10 cm 2.01 cm/m   RA Area:     15.10 cm LA Vol (A2C):   87.3 ml 42.76 ml/m  RA Volume:   41.20 ml  20.18 ml/m LA Vol (A4C):   86.1 ml 42.17 ml/m LA Biplane Vol: 89.0 ml 43.59 ml/m  AORTIC VALVE                    PULMONIC VALVE AV Vmax:           212.00 cm/s  PV Vmax:       1.04 m/s AV Vmean:          127.500 cm/s PV Peak grad:  4.3 mmHg AV VTI:            0.420 m AV Peak Grad:      18.0 mmHg AV Mean Grad:      8.5 mmHg LVOT Vmax:         83.00 cm/s LVOT Vmean:        48.900 cm/s LVOT VTI:          0.173 m LVOT/AV VTI ratio: 0.41 MITRAL VALVE                TRICUSPID VALVE MV Area (PHT): 6.02 cm     TR Peak grad:   31.6 mmHg MV Peak grad:  8.4 mmHg     TR Vmax:        281.00 cm/s MV Mean grad:  3.0 mmHg MV Vmax:       1.45 m/s     SHUNTS MV Vmean:      75.6 cm/s    Systemic VTI: 0.17 m MV Decel Time: 126 msec MV E velocity: 67.60 cm/s MV A velocity: 124.00 cm/s MV E/A ratio:  0.55 Debbe Odea MD Electronically signed by Debbe Odea MD Signature Date/Time: 08/09/2023/4:34:38 PM    Final    DG Chest Port 1 View Result Date: 08/07/2023 CLINICAL DATA:  [id number] Sepsis (HCC) [id number]. Respiratory distress. Difficulty breathing. EXAM: PORTABLE CHEST 1 VIEW COMPARISON:  06/16/2023. FINDINGS: Low lung volume. Redemonstration of left retrocardiac airspace opacity obscuring the left hemidiaphragm, descending thoracic aorta and blunting the left lateral costophrenic angle, suggesting combination of left lung atelectasis and/or consolidation with pleural effusion. There is interval worsening  since the prior study. There is also probable small layering right pleural effusion. There is mild diffuse pulmonary vascular congestion. No pneumothorax. Stable cardio-mediastinal silhouette. There is a left sided 2-lead pacemaker. No acute osseous abnormalities. Right  reverse shoulder arthroplasty noted. The soft tissues are within normal limits. IMPRESSION: *Findings favor congestive heart failure/pulmonary edema. *There is worsening left retrocardiac opacity, as described above. Electronically Signed   By: Jules Schick M.D.   On: 08/07/2023 15:40    ASSESSMENT AND PLAN:  Heart failure with reduced EF, LVEF 25 to 30% New cardiomyopathy Sick sinus syndrome status post [employer] dual-chamber pacemaker 10/2022 Paroxysmal atrial fibrillation CKD stage IV Diabetes, COPD  Diuresed with improved breathing/volume status.  Currently diuretics on hold.  Appears euvolemic on exam.  Resume Lasix p.o. 40 mg daily. Reviewed echocardiogram which showed LVEF of 25 to 30%, mid to apical LV severe hypokinesis with overall preserved function at base on my review.  Possible etiology of new cardiomyopathy including stress cardiomyopathy versus pacemaker induced cardiomyopathy.  Unlikely to have significant CAD contributing with no anginal symptoms and minimal troponin on admission.  No recent pacer interrogation to know RV pacing, last interrogation 01/2023 with 58% RV pacing. Need pacer interrogation which can be done as outpatient if she is being discharged over the weekend.  Also need repeat echo in 4 weeks to see if there is improvement in LVEF. Regarding goal-directed medical therapy, will increase Coreg to 6.25 mg twice daily.  Continue hydralazine/isosorbide and Jardiance.  Cannot be on ARNI or spironolactone due to CKD. Close cardiology follow up as outpatient  Signed: Kathryne Gin MD 08/10/2023, 2:47 PM

## 2023-08-10 NOTE — Assessment & Plan Note (Signed)
Chronic PPM. Cards will arrange for outpatient appointment with EP/Cards to have PPM interrogated.

## 2023-08-11 DIAGNOSIS — I482 Chronic atrial fibrillation, unspecified: Secondary | ICD-10-CM | POA: Diagnosis not present

## 2023-08-11 DIAGNOSIS — I5041 Acute combined systolic (congestive) and diastolic (congestive) heart failure: Secondary | ICD-10-CM | POA: Diagnosis not present

## 2023-08-11 DIAGNOSIS — J9601 Acute respiratory failure with hypoxia: Secondary | ICD-10-CM | POA: Diagnosis not present

## 2023-08-11 DIAGNOSIS — I495 Sick sinus syndrome: Secondary | ICD-10-CM | POA: Diagnosis not present

## 2023-08-11 LAB — COMPREHENSIVE METABOLIC PANEL
ALT: 18 U/L (ref 0–44)
AST: 18 U/L (ref 15–41)
Albumin: 2.5 g/dL — ABNORMAL LOW (ref 3.5–5.0)
Alkaline Phosphatase: 62 U/L (ref 38–126)
Anion gap: 10 (ref 5–15)
BUN: 35 mg/dL — ABNORMAL HIGH (ref 8–23)
CO2: 22 mmol/L (ref 22–32)
Calcium: 7.7 mg/dL — ABNORMAL LOW (ref 8.9–10.3)
Chloride: 102 mmol/L (ref 98–111)
Creatinine, Ser: 1.82 mg/dL — ABNORMAL HIGH (ref 0.44–1.00)
GFR, Estimated: 26 mL/min — ABNORMAL LOW (ref >60)
Glucose, Bld: 87 mg/dL (ref 70–99)
Potassium: 3.9 mmol/L (ref 3.5–5.1)
Sodium: 134 mmol/L — ABNORMAL LOW (ref 135–145)
Total Bilirubin: 0.9 mg/dL (ref 0.0–1.2)
Total Protein: 4.7 g/dL — ABNORMAL LOW (ref 6.5–8.1)

## 2023-08-11 LAB — LEGIONELLA PNEUMOPHILA SEROGP 1 UR AG: L. pneumophila Serogp 1 Ur Ag: NEGATIVE

## 2023-08-11 LAB — GLUCOSE, CAPILLARY: Glucose-Capillary: 101 mg/dL — ABNORMAL HIGH (ref 70–99)

## 2023-08-11 LAB — MAGNESIUM: Magnesium: 1.6 mg/dL — ABNORMAL LOW (ref 1.7–2.4)

## 2023-08-11 MED ORDER — EMPAGLIFLOZIN 10 MG PO TABS: 10.0000 mg | ORAL_TABLET | Freq: Every day | ORAL | 0 refills | Status: DC

## 2023-08-11 MED ORDER — CARVEDILOL 6.25 MG PO TABS: 6.2500 mg | ORAL_TABLET | Freq: Two times a day (BID) | ORAL | 0 refills | Status: DC

## 2023-08-11 MED ORDER — MAGNESIUM SULFATE 2 GM/50ML IV SOLN
2.0000 g | Freq: Once | INTRAVENOUS | Status: AC
  Administered 2023-08-11: 2 g via INTRAVENOUS
  Filled 2023-08-11: qty 50

## 2023-08-11 MED ORDER — ISOSORBIDE MONONITRATE ER 30 MG PO TB24: 30.0000 mg | ORAL_TABLET | Freq: Every day | ORAL | 0 refills | Status: DC

## 2023-08-11 MED ORDER — FUROSEMIDE 40 MG PO TABS
40.0000 mg | ORAL_TABLET | Freq: Every day | ORAL | Status: DC
Start: 2023-08-11 — End: 2023-08-11
  Administered 2023-08-11: 40 mg via ORAL
  Filled 2023-08-11: qty 1

## 2023-08-11 MED ORDER — FUROSEMIDE 40 MG PO TABS: 40.0000 mg | ORAL_TABLET | Freq: Every morning | ORAL | 0 refills | Status: DC

## 2023-08-11 MED ORDER — HYDRALAZINE HCL 50 MG PO TABS: 50.0000 mg | ORAL_TABLET | Freq: Three times a day (TID) | ORAL | 0 refills | Status: DC

## 2023-08-11 NOTE — Progress Notes (Signed)
[facility] Cardiology  CARDIOLOGY PROGRESS NOTE  Patient ID: EVANGELENE NICCUM MRN: [id number] DOB/AGE: [birth year] 89 y.o.  Admit date: 08/07/2023 Referring Physician Dr. Imogene Burn Primary Physician [name], MD Primary Cardiologist Dr. Juliann Pares Reason for Consultation severe LV dysfunction  HPI: Patient with no complaints this morning.  Resting comfortably in bed.  States that she is going home.  No shortness of breath or chest pain.  Review of systems complete and found to be negative unless listed above     Past Medical History:  Diagnosis Date   Acute kidney injury superimposed on CKD (HCC) 11/24/2022   Asthma    Atrial fibrillation with RVR (HCC)    Cancer (HCC)    Basal Cell   Closed left hip fracture (HCC) 01/25/2017   Closed right hip fracture (HCC) 02/13/2023   Diabetes mellitus without complication (HCC)    Femur fracture, left (HCC) 02/03/2015   Heart murmur    Hypertension    Impetigo    Osteoarthritis    Osteopenia    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   Rapid atrial fibrillation, new onset(HCC) 06/05/2022   Vomiting    can not due to surgery   Wears dentures    full upper and lower    Past Surgical History:  Procedure Laterality Date   ABDOMINAL HYSTERECTOMY     BACK SURGERY     BLADDER SURGERY     mesh   CARPAL TUNNEL RELEASE Bilateral    CATARACT EXTRACTION Right 2017   CATARACT EXTRACTION W/PHACO Left 02/06/2016   Procedure: CATARACT EXTRACTION PHACO AND INTRAOCULAR LENS PLACEMENT (IOC) left eye;  Surgeon: Sherald Hess, MD;  Location: [facility];  Service: Ophthalmology;  Laterality: Left;  DIABETIC LEFT Cannot arrive before 9:30   CERVICAL DISC SURGERY     CHOLECYSTECTOMY     EYE SURGERY Bilateral    Cataract Extraction with IOL   FEMUR IM NAIL Left 02/04/2015   Procedure: INTRAMEDULLARY (IM) NAIL FEMORAL;  Surgeon: Juanell Fairly, MD;  Location: [facility] ORS;  Service: Orthopedics;  Laterality: Left;   HARDWARE REMOVAL Left  01/17/2017   Procedure: HARDWARE REMOVAL;  Surgeon: Juanell Fairly, MD;  Location: [facility] ORS;  Service: Orthopedics;  Laterality: Left;   HERNIA REPAIR  2014   esophageal and gastric mesh. patient unable to throw up d/t mesh   HIP ARTHROPLASTY Left 01/26/2017   Procedure: ARTHROPLASTY BIPOLAR HIP (HEMIARTHROPLASTY) removal hardware left hip;  Surgeon: Deeann Saint, MD;  Location: [facility] ORS;  Service: Orthopedics;  Laterality: Left;   INTRAMEDULLARY (IM) NAIL INTERTROCHANTERIC Right 02/13/2023   Procedure: INTRAMEDULLARY (IM) NAIL INTERTROCHANTERIC;  Surgeon: Christena Flake, MD;  Location: [facility] ORS;  Service: Orthopedics;  Laterality: Right;   JOINT REPLACEMENT Bilateral    knees   PACEMAKER IMPLANT N/A 11/15/2022   Procedure: PACEMAKER IMPLANT;  Surgeon: Marcina Millard, MD;  Location: [facility] INVASIVE CV LAB;  Service: Cardiovascular;  Laterality: N/A;   PACEMAKER IMPLANT N/A 11/28/2022   Procedure: PACEMAKER IMPLANT;  Surgeon: Marcina Millard, MD;  Location: [facility] INVAS[facility] CV LAB;  Service: Cardiovascular;  Laterality: N/A;  Lead reposition   REPLACEMENT TOTAL KNEE BILATERAL Bilateral [id number]   SHOULDER ARTHROSCOPY WITH OPEN ROTATOR CUFF REPAIR AND DISTAL CLAVICLE ACROMINECTOMY Left 10/25/2016   Procedure: SHOULDER ARTHROSCOPY WITH OPEN ROTATOR CUFF REPAIR AND DISTAL CLAVICLE ACROMINECTOMY;  Surgeon: Juanell Fairly, MD;  Location: [facility] ORS;  Service: Orthopedics;  Laterality: Left;   THYROID SURGERY     goiter removed   TOTAL HIP REVISION Left 12/02/2017  Procedure: TOTAL HIP REVISION;  Surgeon: Lyndle Herrlich, MD;  Location: [facility] ORS;  Service: Orthopedics;  Laterality: Left;   TOTAL SHOULDER REPLACEMENT Right 2012    Medications Prior to Admission  Medication Sig Dispense Refill Last Dose/Taking   albuterol (VENTOLIN HFA) 108 (90 Base) MCG/ACT inhaler Inhale 2 puffs into the lungs every 6 (six) hours as needed.   Taking As Needed   amiodarone (PACERONE) 200 MG tablet Take 200 mg by  mouth daily.   08/06/2023   apixaban (ELIQUIS) 2.5 MG TABS tablet Take 1 tablet (2.5 mg total) by mouth 2 (two) times daily. 60 tablet 1 08/06/2023   ascorbic acid (VITAMIN C) 250 MG CHEW Chew 250 mg by mouth daily.   08/06/2023   Cholecalciferol (VITAMIN D-1000 MAX ST) 25 MCG (1000 UT) tablet Take 1,000 Units by mouth daily.   08/06/2023   colestipol (COLESTID) 1 g tablet Take 1 tablet by mouth 2 (two) times daily.   08/06/2023   Cyanocobalamin (B-12) 2500 MCG TABS Take 2,500 mcg by mouth daily.   08/06/2023   diltiazem (CARDIZEM CD) 360 MG 24 hr capsule Take 360 mg by mouth daily.   08/06/2023   glipiZIDE (GLUCOTROL XL) 2.5 MG 24 hr tablet Take 2.5 mg by mouth daily with breakfast.   08/06/2023   ipratropium-albuterol (DUONEB) 0.5-2.5 (3) MG/3ML SOLN Inhale 3 mLs into the lungs every 6 (six) hours as needed.   Taking As Needed   Magnesium Oxide 500 MG CAPS Take 500 mg by mouth daily.   08/06/2023   metoprolol tartrate (LOPRESSOR) 25 MG tablet Take 25 mg by mouth 2 (two) times daily.   08/06/2023   Multiple Vitamin (MULTIVITAMIN WITH MINERALS) TABS tablet Take 1 tablet by mouth daily. One-A-Day Women's Vitamin   08/06/2023   omeprazole (PRILOSEC) 20 MG capsule Take 20 mg by mouth every morning.   08/06/2023   ondansetron (ZOFRAN-ODT) 4 MG disintegrating tablet Take 4 mg by mouth every 8 (eight) hours as needed.   Taking As Needed   traMADol (ULTRAM) 50 MG tablet Take 1 tablet by mouth every 6 (six) hours as needed.   Taking As Needed   traZODone (DESYREL) 100 MG tablet Take 100 mg by mouth at bedtime.   08/06/2023   TRELEGY ELLIPTA 100-62.5-25 MCG/ACT AEPB Inhale 1 puff into the lungs daily.   08/07/2023   [DISCONTINUED] carvedilol (COREG) 3.125 MG tablet Take 1 tablet (3.125 mg total) by mouth 2 (two) times daily with a meal. 60 tablet 1 08/06/2023   [DISCONTINUED] furosemide (LASIX) 20 MG tablet Take 1 tablet (20 mg total) by mouth daily. 30 tablet 1 08/06/2023   [DISCONTINUED] hydrALAZINE (APRESOLINE) 25 MG  tablet Take 25 mg by mouth 3 (three) times daily as needed. Take 1 tablet (25 mg total) by mouth 3 (three) times daily as needed (for SBP >150 conistently or DBP>100 persistently)   Taking As Needed   [DISCONTINUED] isosorbide mononitrate (IMDUR) 30 MG 24 hr tablet Take 1 tablet (30 mg total) by mouth daily. 30 tablet 1 08/06/2023   Social History   Socioeconomic History   Marital status: Divorced    Spouse name: Not on file   Number of children: 4   Years of education: Not on file   Highest education level: 12th grade  Occupational History   Occupation: Retitred/Disability  Tobacco Use   Smoking status: Never   Smokeless tobacco: Never  Vaping Use   Vaping status: Never Used  Substance and Sexual Activity   Alcohol  use: No    Alcohol/week: 0.0 standard drinks of alcohol   Drug use: No   Sexual activity: Never  Other Topics Concern   Not on file  Social History Narrative   Not on file   Social Drivers of Health   Financial Resource Strain: Low Risk  (06/06/2023)   Overall Financial Resource Strain (CARDIA)    Difficulty of Paying Living Expenses: Not hard at all  Food Insecurity: No Food Insecurity (08/09/2023)   Hunger Vital Sign    Worried About Running Out of Food in the Last Year: Never true    Ran Out of Food in the Last Year: Never true  Transportation Needs: No Transportation Needs (08/09/2023)   PRAPARE - Administrator, Civil Service (Medical): No    Lack of Transportation (Non-Medical): No  Physical Activity: Not on file  Stress: Not on file  Social Connections: Moderately Integrated (08/09/2023)   Social Connection and Isolation Panel [NHANES]    Frequency of Communication with Friends and Family: Twice a week    Frequency of Social Gatherings with Friends and Family: Three times a week    Attends Religious Services: 1 to 4 times per year    Active Member of Clubs or Organizations: No    Attends Banker Meetings: 1 to 4 times per year     Marital Status: Divorced  Catering manager Violence: Not At Risk (08/09/2023)   Humiliation, Afraid, Rape, and Kick questionnaire    Fear of Current or Ex-Partner: No    Emotionally Abused: No    Physically Abused: No    Sexually Abused: No    Family History  Problem Relation Age of Onset   Heart failure Mother    Hypertension Mother    Emphysema Father       Review of systems complete and found to be negative unless listed above      PHYSICAL EXAM  Alert and oriented x 3. No pedal edema No wheeze or crackles Systolic murmur mitral area/left lower sternal border  Labs:   Lab Results  Component Value Date   WBC 4.6 08/08/2023   HGB 11.0 (L) 08/08/2023   HCT 35.8 (L) 08/08/2023   MCV 96.5 08/08/2023   PLT 182 08/08/2023    Recent Labs  Lab 08/11/23 0303  NA 134*  K 3.9  CL 102  CO2 22  BUN 35*  CREATININE 1.82*  CALCIUM 7.7*  PROT 4.7*  BILITOT 0.9  ALKPHOS 62  ALT 18  AST 18  GLUCOSE 87   Lab Results  Component Value Date   CKTOTAL 93 02/13/2023   TROPONINI < 0.02 07/13/2014   No results found for: "CHOL" No results found for: "HDL" No results found for: "LDLCALC" No results found for: "TRIG" No results found for: "CHOLHDL" No results found for: "LDLDIRECT"    Radiology: [facility] Chest Port 1 View Result Date: 08/10/2023 CLINICAL DATA:  89 year old female history of acute on chronic diastolic congestive heart failure. EXAM: PORTABLE CHEST 1 VIEW COMPARISON:  Chest x-ray 08/07/2023. FINDINGS: Left-sided pacemaker device in place with lead tips projecting over the expected location of the right atrium and right ventricle. Lung volumes are normal. Bibasilar opacities are noted (left-greater-than-right) which may reflect areas of atelectasis and/or consolidation. Small right and moderate left pleural effusions. Diffuse interstitial prominence and peribronchial cuffing. Cephalization of the pulmonary vasculature. Mild cardiomegaly. Upper mediastinal contours are  within normal limits. Atherosclerotic calcifications are noted in the thoracic aorta. Status post right  shoulder arthroplasty. IMPRESSION: 1. The appearance the chest is most compatible with acute congestive heart failure, as above. 2. Aortic atherosclerosis. Electronically Signed   By: Trudie Reed M.D.   On: 08/10/2023 11:33   ECHOCARDIOGRAM COMPLETE Result Date: 08/09/2023    ECHOCARDIOGRAM REPORT   Patient Name:   SHENEE GANESAN Geerts Date of Exam: 08/09/2023 Medical Rec #:  [id number]     Height:       69.0 in Accession #:    [id number]    Weight:       194.5 lb Date of Birth:  [birth year]      BSA:          2.042 m Patient Age:    89 years      BP:           172/81 mmHg Patient Gender: F             HR:           62 bpm. Exam Location:  [facility] Procedure: 2D Echo, 3D Echo, Cardiac Doppler and Color Doppler Indications:     CHF  History:         Patient has prior history of Echocardiogram examinations, most                  recent 02/09/2023. CHF, Abnormal ECG, COPD, Arrythmias:Atrial                  Fibrillation, Signs/Symptoms:Dizziness/Lightheadedness; Risk                  Factors:Hypertension and Diabetes. CKD.  Sonographer:     Mikki Harbor Referring Phys:  [id number] ERIC CHEN Diagnosing Phys: Debbe Odea MD IMPRESSIONS  1. Left ventricular ejection fraction, by estimation, is 25 to 30%. Left ventricular ejection fraction by 2D MOD biplane is 26.6 %. The left ventricle has severely decreased function. The left ventricle demonstrates global hypokinesis. There is mild left ventricular hypertrophy. Left ventricular diastolic parameters are indeterminate.  2. Right ventricular systolic function is normal. The right ventricular size is normal. There is mildly elevated pulmonary artery systolic pressure.  3. Left atrial size was moderately dilated.  4. The mitral valve is degenerative. Moderate mitral valve regurgitation.  5. Tricuspid valve regurgitation is mild to moderate.  6. The aortic valve is calcified.  Aortic valve regurgitation is not visualized. Aortic valve sclerosis/calcification is present, without any evidence of aortic stenosis. Aortic valve mean gradient measures 8.5 mmHg.  7. The inferior vena cava is dilated in size with >50% respiratory variability, suggesting right atrial pressure of 8 mmHg. FINDINGS  Left Ventricle: Left ventricular ejection fraction, by estimation, is 25 to 30%. Left ventricular ejection fraction by 2D MOD biplane is 26.6 %. The left ventricle has severely decreased function. The left ventricle demonstrates global hypokinesis. The left ventricular internal cavity size was normal in size. There is mild left ventricular hypertrophy. Left ventricular diastolic parameters are indeterminate. Right Ventricle: The right ventricular size is normal. No increase in right ventricular wall thickness. Right ventricular systolic function is normal. There is mildly elevated pulmonary artery systolic pressure. The tricuspid regurgitant velocity is 2.81  m/s, and with an assumed right atrial pressure of 8 mmHg, the estimated right ventricular systolic pressure is 39.6 mmHg. Left Atrium: Left atrial size was moderately dilated. Right Atrium: Right atrial size was normal in size. Pericardium: There is no evidence of pericardial effusion. Mitral Valve: The mitral valve is degenerative in appearance. Moderate mitral valve regurgitation. MV  peak gradient, 8.4 mmHg. The mean mitral valve gradient is 3.0 mmHg. Tricuspid Valve: The tricuspid valve is normal in structure. Tricuspid valve regurgitation is mild to moderate. Aortic Valve: The aortic valve is calcified. Aortic valve regurgitation is not visualized. Aortic valve sclerosis/calcification is present, without any evidence of aortic stenosis. Aortic valve mean gradient measures 8.5 mmHg. Aortic valve peak gradient measures 18.0 mmHg. Pulmonic Valve: The pulmonic valve was not well visualized. Pulmonic valve regurgitation is not visualized. Aorta: The  aortic root is normal in size and structure. Venous: The inferior vena cava is dilated in size with greater than 50% respiratory variability, suggesting right atrial pressure of 8 mmHg. IAS/Shunts: No atrial level shunt detected by color flow Doppler.  LEFT VENTRICLE PLAX 2D                        Biplane EF (MOD) LVIDd:         5.00 cm         LV Biplane EF:   Left LVIDs:         3.70 cm                          ventricular LV PW:         1.20 cm                          ejection LV IVS:        1.50 cm                          fraction by                                                 2D MOD                                                 biplane is LV Volumes (MOD)                                26.6 %. LV vol d, MOD    108.0 ml A2C:                           Diastology LV vol d, MOD    105.0 ml      LV e' medial:    3.48 cm/s A4C:                           LV E/e' medial:  19.4 LV vol s, MOD    79.4 ml       LV e' lateral:   9.79 cm/s A2C:                           LV E/e' lateral: 6.9 LV vol s, MOD    76.4 ml A4C: LV SV MOD A2C:   28.6 ml LV SV MOD A4C:   105.0 ml LV  SV MOD BP:    28.3 ml RIGHT VENTRICLE RV Basal diam:  3.55 cm RV Mid diam:    3.00 cm RV S prime:     23.70 cm/s TAPSE (M-mode): 1.9 cm LEFT ATRIUM             Index        RIGHT ATRIUM           Index LA diam:        4.10 cm 2.01 cm/m   RA Area:     15.10 cm LA Vol (A2C):   87.3 ml 42.76 ml/m  RA Volume:   41.20 ml  20.18 ml/m LA Vol (A4C):   86.1 ml 42.17 ml/m LA Biplane Vol: 89.0 ml 43.59 ml/m  AORTIC VALVE                    PULMONIC VALVE AV Vmax:           212.00 cm/s  PV Vmax:       1.04 m/s AV Vmean:          127.500 cm/s PV Peak grad:  4.3 mmHg AV VTI:            0.420 m AV Peak Grad:      18.0 mmHg AV Mean Grad:      8.5 mmHg LVOT Vmax:         83.00 cm/s LVOT Vmean:        48.900 cm/s LVOT VTI:          0.173 m LVOT/AV VTI ratio: 0.41 MITRAL VALVE                TRICUSPID VALVE MV Area (PHT): 6.02 cm     TR Peak grad:   31.6 mmHg  MV Peak grad:  8.4 mmHg     TR Vmax:        281.00 cm/s MV Mean grad:  3.0 mmHg MV Vmax:       1.45 m/s     SHUNTS MV Vmean:      75.6 cm/s    Systemic VTI: 0.17 m MV Decel Time: 126 msec MV E velocity: 67.60 cm/s MV A velocity: 124.00 cm/s MV E/A ratio:  0.55 Debbe Odea MD Electronically signed by Debbe Odea MD Signature Date/Time: 08/09/2023/4:34:38 PM    Final    DG Chest Port 1 View Result Date: 08/07/2023 CLINICAL DATA:  [id number] Sepsis (HCC) [id number]. Respiratory distress. Difficulty breathing. EXAM: PORTABLE CHEST 1 VIEW COMPARISON:  06/16/2023. FINDINGS: Low lung volume. Redemonstration of left retrocardiac airspace opacity obscuring the left hemidiaphragm, descending thoracic aorta and blunting the left lateral costophrenic angle, suggesting combination of left lung atelectasis and/or consolidation with pleural effusion. There is interval worsening since the prior study. There is also probable small layering right pleural effusion. There is mild diffuse pulmonary vascular congestion. No pneumothorax. Stable cardio-mediastinal silhouette. There is a left sided 2-lead pacemaker. No acute osseous abnormalities. Right reverse shoulder arthroplasty noted. The soft tissues are within normal limits. IMPRESSION: *Findings favor congestive heart failure/pulmonary edema. *There is worsening left retrocardiac opacity, as described above. Electronically Signed   By: Jules Schick M.D.   On: 08/07/2023 15:40    ASSESSMENT AND PLAN:  Heart failure with reduced EF, LVEF 25 to 30% New cardiomyopathy Sick sinus syndrome status post [employer] dual-chamber pacemaker 10/2022 Paroxysmal atrial fibrillation CKD stage IV Diabetes, COPD   Diuresed with improved breathing/volume status.  Currently diuretics on hold.  Appears euvolemic on exam.  P.o. Lasix 40 mg daily. Reviewed echocardiogram which showed LVEF of 25 to 30%, mid to apical LV severe hypokinesis with overall preserved function at base  on my review.  Possible etiology of new cardiomyopathy including stress cardiomyopathy versus pacemaker induced cardiomyopathy.  Unlikely to have significant CAD contributing with no anginal symptoms and minimal troponin on admission.  No recent pacer interrogation to know RV pacing, last interrogation 01/2023 with 58% RV pacing. Need pacer interrogation which can be done as outpatient.  Also need repeat echo in 4 weeks to see if there is improvement in LVEF. Regarding goal-directed medical therapy, continue Coreg to 6.25 mg twice daily.  Continue hydralazine/isosorbide and Jardiance.  Cannot be on ARNI or spironolactone due to CKD. Close cardiology follow up as outpatient in a week  Signed: Kathryne Gin MD 08/11/2023, 2:07 PM

## 2023-08-11 NOTE — TOC Transition Note (Addendum)
Transition of Care [facility]) - Discharge Note   Patient Details  Name: Leslie Parker MRN: [id number] Date of Birth: 10-02-1932  Transition of [facility]) CM/SW Contact:  Bing Quarry, RN Phone Number: 08/11/2023, 10:00 AM   Clinical Narrative:  1/19: Patient has discharge orders in. Contacted [facility] via Laurelyn Sickle and confirmed resumption of HH services. Requested RN to be added to [facility] orders as patient was receiving PT/OT/RN services prior to this admission. Confirmed with provider. Patient is currently on Room Air.  No new DME orders or recommendations seen on review of PT evaluation and CM initial notes.   Granddaughter is HCPOA. [name],[name] (POA) (Granddaughter) [phone] (Home Phone)  Follow ups: Follow up with PCP within 7-14 days is recommended: Dr. Nemiah Commander and Amb referral to HF clinic in place. Contact information listed below.   AMB referral to CHF clinic Complete by: As directed  [facility]  [phone]   [address] [place] [address]    Consultation  Specialty Services Required   PCP: [name], MD     General - Internal Medicine    [phone]    [phone]    Gabriel Cirri MSN RN CM  RN Case Manager [facility]  Transitions of Care Direct Dial: [phone] (Weekends Only) [facility] Main Office Phone: [phone] [facility] Fax: [phone] [email or web address]   Final next level of care: Home w Home Health Services Barriers to Discharge: Barriers Resolved   Patient Goals and CMS Choice   CMS Medicare.gov Compare Post Acute Care list provided to:: Patient Represenative (must comment) (Pt's granddaughter) Choice offered to / list presented to : [facility] POA / Guardian (POA- Granddaughter)      Discharge Placement                       Discharge Plan and Services Additional resources added to the After Visit Summary for                    DME Agency: NA       HH Arranged: RN, PT,  OT HH Agency: [facility] Date [facility] Agency Contacted: 08/11/23 Time HH Agency Contacted: 1000 Representative spoke with at [facility] Agency: Laurelyn Sickle  Social Drivers of Health (SDOH) Interventions SDOH Screenings   Food Insecurity: No Food Insecurity (08/09/2023)  Housing: Low Risk  (08/09/2023)  Transportation Needs: No Transportation Needs (08/09/2023)  Utilities: Not At Risk (08/09/2023)  Financial Resource Strain: Low Risk  (06/06/2023)  Social Connections: Moderately Integrated (08/09/2023)  Tobacco Use: Low Risk  (08/07/2023)     Readmission Risk Interventions    08/08/2023   11:36 AM 03/21/2023    1:12 PM 08/27/2022   12:05 PM  Readmission Risk Prevention Plan  Transportation Screening Complete Complete Complete  PCP or Specialist Appt within 3-5 Days  Complete Complete  HRI or Home Care Consult  Complete Complete  Social Work Consult for Recovery Care Planning/Counseling  Complete   Palliative Care Screening  Not Applicable   Medication Review Oceanographer) Complete Complete Complete  PCP or Specialist appointment within 3-5 days of discharge Complete    HRI or Home Care Consult Complete    SW Recovery Care/Counseling Consult Complete    Palliative Care Screening Not Applicable    Skilled Nursing Facility Not Applicable

## 2023-08-11 NOTE — Discharge Summary (Addendum)
[facility] Physician Discharge Summary   Patient name: Leslie Parker  Admit date:     08/07/2023  Discharge date: 08/11/2023  Attending Physician: Leslie Parker [[id number]]  Discharge Physician: Leslie Parker   PCP: Leslie Baas, MD  Admitted From: Home  Disposition:  Home  Recommendations for Outpatient Follow-up:  Follow up with PCP in 1-2 weeks F/U with cardiology in 1-2 weeks. Cards office will call for appointment  Home Health:Yes. Home PT/OT Equipment/Devices: None    Discharge Condition:Stable CODE STATUS:FULL Diet recommendation: Heart Healthy Fluid Restriction: 1800 ml/day  Hospital Summary: HPI: Leslie Parker is a 89 y.o. female with medical history significant of chronic HFpEF, asthma, atrial fibrillation, stage III CKD type 2 diabetes, hypertension presented with acute respiratory failure with hypoxia, acute on chronic HFpEF and pneumonia.  Patient reports increased work of breathing over the past 1 to 2 days.  Positive cough.  Mild orthopnea PND.  No fevers or chills.  No chest pain.  No nausea or vomiting.  No focal hemiparesis or confusion.  Baseline medical issues including atrial fibrillation and type 2 diabetes.  No recent medication changes.  No reported recent sick contacts.  No reported alcohol or tobacco use. Presented to the ER afebrile, hemodynamically stable.  Satting in upper 80s on room air, transition to BiPAP and then ultimately 2 L nasal cannula to keep O2 sats greater than 93%.  White count 4.9, hemoglobin 12.6, platelets 204, VBG stable, creatinine 2.07, glucose 189, lactate 1.1.  COVID flu and RSV negative.  BNP of 4285 with baseline BNP around 1000.  Chest x-ray with CHF versus pulmonary edema as well as left retrocardiac pneumonia. EKG NSR.   Significant Events: Admitted 08/07/2023 for acute respiratory failure with hypoxia, pneumonia, acute on chronic diastolic CHF   Significant Labs: White count 4.9, hemoglobin 12.6, platelets 204, VBG  stable, creatinine 2.07, glucose 189, lactate 1.1.  COVID flu and RSV negative.  BNP of 4285 with baseline BNP around 1000   Significant Imaging Studies: Chest x-ray with CHF versus pulmonary edema as well as left retrocardiac pneumonia  Echo shows new reduced LVEF of 25%, global hypokinesis  Antibiotic Therapy: Anti-infectives (From admission, onward)    Start     Dose/Rate Route Frequency Ordered Stop   08/08/23 1000  cefTRIAXone (ROCEPHIN) 2 g in sodium chloride 0.9 % 100 mL IVPB        2 g 200 mL/hr over 30 Minutes Intravenous Every 24 hours 08/07/23 1626 08/12/23 0959   08/08/23 1000  azithromycin (ZITHROMAX) 500 mg in sodium chloride 0.9 % 250 mL IVPB        500 mg 250 mL/hr over 60 Minutes Intravenous Every 24 hours 08/07/23 1626 08/12/23 0959   08/07/23 1300  cefTRIAXone (ROCEPHIN) 2 g in sodium chloride 0.9 % 100 mL IVPB        2 g 200 mL/hr over 30 Minutes Intravenous Once 08/07/23 1250 08/07/23 1357   08/07/23 1300  azithromycin (ZITHROMAX) 500 mg in sodium chloride 0.9 % 250 mL IVPB        500 mg 250 mL/hr over 60 Minutes Intravenous  Once 08/07/23 1250 08/07/23 1503       Procedures:   Consultants: Cardiology   Hospital Course by Problem: * Acute respiratory failure with hypoxia (HCC) On admission, pt was diagnosed with Pneumonia, Acute on chronic HFpEF. Decompensated resp failure requiring 2 L and cannula on presentation. Likely secondary to overlapping pneumonia as well as acute on chronic HFpEF. BNP  [id number] which is well above baseline. Chest x-ray with CHF and left retrocardiac pneumonia. IV Lasix. IV Rocephin azithromycin for infectious coverage. Blood and respiratory cultures.Monitor respiratory status appropriately. 08-08-2023 pt has been weaned to RA.  Pt remains on IV lasix. Pt has normal WBC and no fevers.  Have ordered procalcitonin but just found out that lab machine for procalcitonin is broken. I do not strongly feel she has pneumonia given how fast her  hypoxia has cleared. Will leave abx going for 1 more day. Hopefully procalcitonin lab will be able to be run tomorrow. I think her acute respiratory failure with hypoxia was due to pulmonary edema from acute on chronic diastolic CHF. 08-09-2023 hypoxia has resolved. Due to CHF, not pneumonia. Pneumonia has been rule out. Stop abx. 08-10-2023 diarrhea started in ER. Could be from abx that were started in ER. However, given recent outbreak of Nori virus, will check diarrhea panel.  08-11-2023 GI diarrhea panel negative. Likely diarrhea from short course of abx. Diarrhea has improved.  Acute combined systolic (congestive) and diastolic (congestive) heart failure (HCC) - LVEF 25% 08-10-2023 echo yesterday showed LVEF 25-30%. Back in July 2024, LVEF was 60-65%. BP is much improved since yesterday. On RA. [facility] cardiology consulted to assist. Don't she is volume overloaded currently.  PCXR today shows improved aeration since admission. She's had about 3.8 liters in urine output since admission 3 days ago. Baseline HR in the low 60s. On coreg 3.125 mg bid. Not much room to increase betablocker.  On hydralazine 50 mg tid and imdur 30 mg at bedtime. She has CKD stage 3b at baseline.  08-11-2023 no Entresto/ARB/ACEI/aldactone due to CKD stage 3b per my discussion with cardiology. F/u with EP/Cards in the office for PPM interrogation.  Dc to home with coreg 6.25 mg bid, hydralazine 50 mg tid, imdur 30 mg at bedtime, lasix 40 mg qam. Added Jardiance 10 mg qday.  Acute on chronic diastolic CHF (congestive heart failure) (HCC) On admission, 2D echo July 2024 with EF 60 to 65% and grade 1 diastolic dysfunction. BNP 4285 today and CHF pattern. S/p 20mg  IV lasix in the ER. Will acutely increase home lasix 20mg  daily-->40mg  daily. Weight today 88kg. Strict Is and Os and daily weights. Cardiology consult as clinically indicated.  08-08-2023 continue with lasix 40 mg IV q12h. Continue with coreg 3.125 mg bid.  Add  Jardiance 10 mg qday.  Pt denies any hx of frequent UTIs. Monitor Scr. Was seen by outpt cardiology in 06-2023. No mention as to why pt is not on GDMT. Specifically not on Entresto.  Possibly due to CKD stage 3b. Will need referral back to cards clinic for them to decide if this is appropriate for patient. 08-09-2023 awaiting labs. Possible change to po lasix. 08-10-2023 lasix stopped yesterday due to rising Scr. Pt looks euvolemic now. Only trace pretibial edema. LVEF decreased now to 25%. [facility] cards consulted today.  08-11-2023 pt now with combined systolic/diastolic CHF. See below  Type II diabetes mellitus with renal manifestations (HCC) On admission, Blood sugar 180s. SSI. A1C. Monitor  08-08-2023 will order A1C. CBG acceptable ranges. Continue with SSI. Add Jardiance for additional diuretic effect. Should also help with CBG. 08-09-2023 stable. CBG acceptable ranges. 08-10-2023 stable. CBG acceptable 08-11-2023 stable. Continue Jardiance   Sick sinus syndrome (HCC) Chronic PPM. Cards will arrange for outpatient appointment with EP/Cards to have PPM interrogated.  COPD (chronic obstructive pulmonary disease) (HCC) On admission, Appears fairly stable from a respiratory standpoint. Cont prn inhalers  08-08-2023 stable. Not exacerbated. 08-09-2023 stable.  08-10-2023 stable. 08-11-2023 stable. Continue home inhalers of Trelegy(100-62.5-25) 1 puff bid  Chronic a-fib (HCC) On admission, Rate controlled at present. Cont home regimen including amiodarone and eliquis  08-08-2023 stable. Continue coreg 3.125 mg bid, eliquis 5 mg bid, amiodarone 200 mg daily.  EKG on admission showed NSR. 08-09-2023 stable. 08-10-2023 stable.  08-11-2023. Has PPM for SSS. DC to home on coreg 6.125 mg bid, continue Eliquis 5 mg bid. Amiodarone 200 mg every day.   Stage 3b chronic kidney disease (HCC) - Baseline scr 1.8-2.0 On admission, Cr 2 w/ GFR in 20s. Appears near baseline. Monitor  08-08-2023  Scr 1.84, BUN 39 with diuresis. Continue to monitor during diuresis. Baseline scr 1.8-2.0 08-09-2023 awaiting AM labs. 08-10-2023 Scr up to 1.95 with diuresis. Last dose of Lasix yesterday around 10 AM.  08-11-2023 Scr stable at 1.82, BUN 35. Cards wants to go to home on lasix 40 mg daily. Restart today.  Essential hypertension 08-08-2023 on coreg 3.125 mg bid. Added IV lasix 40 mg bid. May need additional BP control. 08-09-2023 needs additional BP control. Given CKD stage 3b, will add hydralazine 50 mg tid. HR in low 60s on coreg 3.125 mg bid. 08-10-2023 BP has improved. Currently on coreg 3.125 mg bid(limited to increasing due to baseline HR of low 60s), hydralazine 50 mg tid, Imdur 30 mg at bedtime.  08-11-2023 DC to home on coreg 6.125 mg bid, hydralazine 50 mg tid, imdur 30 mg at bedtime, lasix 40 mg qam, jardiance 10 mg qday.  Hypokalemia 08-10-2023 replete with po kcl. 08-11-2023 resolved.  Hypomagnesemia 08-10-2023 replete with IV mg 08-11-2023 give another 2 gram IV prior to DC.    Discharge Diagnoses:  Principal Problem:   Acute respiratory failure with hypoxia (HCC) Active Problems:   Acute on chronic diastolic CHF (congestive heart failure) (HCC)   Acute combined systolic (congestive) and diastolic (congestive) heart failure (HCC) - LVEF 25%   Stage 3b chronic kidney disease (HCC) - Baseline scr 1.8-2.0   Chronic a-fib (HCC)   COPD (chronic obstructive pulmonary disease) (HCC)   Sick sinus syndrome (HCC)   Type II diabetes mellitus with renal manifestations (HCC)   Essential hypertension   Hypomagnesemia   Hypokalemia   Discharge Instructions  Discharge Instructions     AMB referral to CHF clinic   Complete by: As directed    Reason for referral: Systolic HF   Call MD for:  difficulty breathing, headache or visual disturbances   Complete by: As directed    Call MD for:  extreme fatigue   Complete by: As directed    Call MD for:  persistant dizziness or  light-headedness   Complete by: As directed    Call MD for:  persistant nausea and vomiting   Complete by: As directed    Call MD for:  temperature >100.4   Complete by: As directed    Diet - low sodium heart healthy   Complete by: As directed    60 ounces per day fluid restriction   Discharge instructions   Complete by: As directed    1. Follow up with your primary care provider in 1 week after hospital discharge 2. Your cardiology office will call you for an appointment to be seen in the next 2 weeks.   Increase activity slowly   Complete by: As directed       Allergies as of 08/11/2023       Reactions   Baclofen Other (  See Comments)   Elevated Cr   Simvastatin Other (See Comments)   Pt denies Elevated LFTs with simva 40mg , stopped and resolved (see MD note 11/10/13)        Medication List     STOP taking these medications    diltiazem 360 MG 24 hr capsule Commonly known as: CARDIZEM CD   glipiZIDE 2.5 MG 24 hr tablet Commonly known as: GLUCOTROL XL   metoprolol tartrate 25 MG tablet Commonly known as: LOPRESSOR       TAKE these medications    albuterol 108 (90 Base) MCG/ACT inhaler Commonly known as: VENTOLIN HFA Inhale 2 puffs into the lungs every 6 (six) hours as needed.   amiodarone 200 MG tablet Commonly known as: PACERONE Take 200 mg by mouth daily.   apixaban 2.5 MG Tabs tablet Commonly known as: ELIQUIS Take 1 tablet (2.5 mg total) by mouth 2 (two) times daily.   ascorbic acid 250 MG Chew Commonly known as: VITAMIN C Chew 250 mg by mouth daily.   B-12 2500 MCG Tabs Take 2,500 mcg by mouth daily.   carvedilol 6.25 MG tablet Commonly known as: COREG Take 1 tablet (6.25 mg total) by mouth 2 (two) times daily with a meal. What changed:  medication strength how much to take   colestipol 1 g tablet Commonly known as: COLESTID Take 1 tablet by mouth 2 (two) times daily.   empagliflozin 10 MG Tabs tablet Commonly known as:  JARDIANCE Take 1 tablet (10 mg total) by mouth daily. Start taking on: August 12, 2023   furosemide 40 MG tablet Commonly known as: LASIX Take 1 tablet (40 mg total) by mouth in the morning. What changed:  medication strength how much to take when to take this   hydrALAZINE 50 MG tablet Commonly known as: APRESOLINE Take 1 tablet (50 mg total) by mouth 3 (three) times daily. What changed:  medication strength how much to take when to take this reasons to take this additional instructions   ipratropium-albuterol 0.5-2.5 (3) MG/3ML Soln Commonly known as: DUONEB Inhale 3 mLs into the lungs every 6 (six) hours as needed.   isosorbide mononitrate 30 MG 24 hr tablet Commonly known as: IMDUR Take 1 tablet (30 mg total) by mouth at bedtime. What changed: when to take this   Magnesium Oxide -Mg Supplement 500 MG Caps Take 500 mg by mouth daily.   multivitamin with minerals Tabs tablet Take 1 tablet by mouth daily. One-A-Day Women's Vitamin   omeprazole 20 MG capsule Commonly known as: PRILOSEC Take 20 mg by mouth every morning.   ondansetron 4 MG disintegrating tablet Commonly known as: ZOFRAN-ODT Take 4 mg by mouth every 8 (eight) hours as needed.   traMADol 50 MG tablet Commonly known as: ULTRAM Take 1 tablet by mouth every 6 (six) hours as needed.   traZODone 100 MG tablet Commonly known as: DESYREL Take 100 mg by mouth at bedtime.   Trelegy Ellipta 100-62.5-25 MCG/ACT Aepb Generic drug: Fluticasone-Umeclidin-Vilant Inhale 1 puff into the lungs daily.   Vitamin D-1000 Max St 25 MCG (1000 UT) tablet Generic drug: Cholecalciferol Take 1,000 Units by mouth daily.        Follow-up Information     Delma Freeze, FNP .   Specialty: Family Medicine Contact information: [address] [place] [address] [phone]                Allergies  Allergen Reactions   Baclofen Other (See Comments)  Elevated Cr   Simvastatin  Other (See Comments)    Pt denies  Elevated LFTs with simva 40mg , stopped and resolved (see MD note 11/10/13)    Discharge Exam: Vitals:   08/11/23 0835 08/11/23 1212  BP: (!) 186/85 (!) 155/88  Pulse: 75 70  Resp: 18 18  Temp: 99.1 F (37.3 C) 98 F (36.7 C)  SpO2: 94% 95%    Physical Exam Vitals and nursing note reviewed.  HENT:     Head: Normocephalic and atraumatic.     Nose: Nose normal.  Eyes:     General: No scleral icterus. Cardiovascular:     Rate and Rhythm: Normal rate and regular rhythm.  Pulmonary:     Effort: Pulmonary effort is normal. No respiratory distress.     Breath sounds: Normal breath sounds.  Abdominal:     General: Bowel sounds are normal.     Palpations: Abdomen is soft.  Musculoskeletal:     Right lower leg: No edema.     Left lower leg: No edema.     Comments: LE edema has resolved completely  Skin:    General: Skin is warm and dry.     Capillary Refill: Capillary refill takes less than 2 seconds.  Neurological:     Mental Status: She is alert and oriented to person, place, and time.     The results of significant diagnostics from this hospitalization (including imaging, microbiology, ancillary and laboratory) are listed below for reference.    Microbiology: Recent Results (from the past 240 hours)  Culture, blood (Routine x 2)     Status: None (Preliminary result)   Collection Time: 08/07/23 12:48 PM   Specimen: BLOOD  Result Value Ref Range Status   Specimen Description BLOOD BLOOD RIGHT ARM  Final   Special Requests   Final    BOTTLES DRAWN AEROBIC ONLY Blood Culture results may not be optimal due to an inadequate volume of blood received in culture bottles   Culture   Final    NO GROWTH 4 DAYS Performed at [facility], [address], [place], [place] [address]    Report Status PENDING  Incomplete  Resp panel by RT-PCR (RSV, Flu A&B, Covid) Anterior Nasal Swab     Status: None   Collection Time: 08/07/23 12:48 PM    Specimen: Anterior Nasal Swab  Result Value Ref Range Status   SARS Coronavirus 2 by RT PCR NEGATIVE NEGATIVE Final    Comment: (NOTE) SARS-CoV-2 target nucleic acids are NOT DETECTED.  The SARS-CoV-2 RNA is generally detectable in upper respiratory specimens during the acute phase of infection. The lowest concentration of SARS-CoV-2 viral copies this assay can detect is 138 copies/mL. A negative result does not preclude SARS-Cov-2 infection and should not be used as the sole basis for treatment or other patient management decisions. A negative result may occur with  improper specimen collection/handling, submission of specimen other than nasopharyngeal swab, presence of viral mutation(s) within the areas targeted by this assay, and inadequate number of viral copies(<138 copies/mL). A negative result must be combined with clinical observations, patient history, and epidemiological information. The expected result is Negative.  Fact Sheet for Patients:  BloggerCourse.com  Fact Sheet for Healthcare Providers:  SeriousBroker.it  This test is no t yet approved or cleared by the [place] FDA and  has been authorized for detection and/or diagnosis of SARS-CoV-2 by FDA under an Emergency Use Authorization (EUA). This EUA will remain  in effect (meaning this test  can be used) for the duration of the COVID-19 declaration under Section 564(b)(1) of the Act, 21 U.S.C.section 360bbb-3(b)(1), unless the authorization is terminated  or revoked sooner.       Influenza A by PCR NEGATIVE NEGATIVE Final   Influenza B by PCR NEGATIVE NEGATIVE Final    Comment: (NOTE) The Xpert Xpress SARS-CoV-2/FLU/RSV plus assay is intended as an aid in the diagnosis of influenza from Nasopharyngeal swab specimens and should not be used as a sole basis for treatment. Nasal washings and aspirates are unacceptable for Xpert Xpress  SARS-CoV-2/FLU/RSV testing.  Fact Sheet for Patients: BloggerCourse.com  Fact Sheet for Healthcare Providers: SeriousBroker.it  This test is not yet approved or cleared by the [place] FDA and has been authorized for detection and/or diagnosis of SARS-CoV-2 by FDA under an Emergency Use Authorization (EUA). This EUA will remain in effect (meaning this test can be used) for the duration of the COVID-19 declaration under Section 564(b)(1) of the Act, 21 U.S.C. section 360bbb-3(b)(1), unless the authorization is terminated or revoked.     Resp Syncytial Virus by PCR NEGATIVE NEGATIVE Final    Comment: (NOTE) Fact Sheet for Patients: BloggerCourse.com  Fact Sheet for Healthcare Providers: SeriousBroker.it  This test is not yet approved or cleared by the [place] FDA and has been authorized for detection and/or diagnosis of SARS-CoV-2 by FDA under an Emergency Use Authorization (EUA). This EUA will remain in effect (meaning this test can be used) for the duration of the COVID-19 declaration under Section 564(b)(1) of the Act, 21 U.S.C. section 360bbb-3(b)(1), unless the authorization is terminated or revoked.  Performed at [facility], [address] [place], [place] [address]   Culture, blood (Routine x 2)     Status: None (Preliminary result)   Collection Time: 08/07/23  1:17 PM   Specimen: BLOOD  Result Value Ref Range Status   Specimen Description BLOOD BLOOD LEFT ARM  Final   Special Requests   Final    BOTTLES DRAWN AEROBIC AND ANAEROBIC Blood Culture results may not be optimal due to an inadequate volume of blood received in culture bottles   Culture   Final    NO GROWTH 4 DAYS Performed at [facility], [address] [place], [place] [address]    Report Status PENDING  Incomplete  Gastrointestinal Panel by PCR , Stool     Status: None    Collection Time: 08/10/23 10:40 AM   Specimen: Stool  Result Value Ref Range Status   Campylobacter species NOT DETECTED NOT DETECTED Final   Plesimonas shigelloides NOT DETECTED NOT DETECTED Final   Salmonella species NOT DETECTED NOT DETECTED Final   Yersinia enterocolitica NOT DETECTED NOT DETECTED Final   Vibrio species NOT DETECTED NOT DETECTED Final   Vibrio cholerae NOT DETECTED NOT DETECTED Final   Enteroaggregative E coli (EAEC) NOT DETECTED NOT DETECTED Final   Enteropathogenic E coli (EPEC) NOT DETECTED NOT DETECTED Final   Enterotoxigenic E coli (ETEC) NOT DETECTED NOT DETECTED Final   Shiga like toxin producing E coli (STEC) NOT DETECTED NOT DETECTED Final   Shigella/Enteroinvasive E coli (EIEC) NOT DETECTED NOT DETECTED Final   Cryptosporidium NOT DETECTED NOT DETECTED Final   Cyclospora cayetanensis NOT DETECTED NOT DETECTED Final   Entamoeba histolytica NOT DETECTED NOT DETECTED Final   Giardia lamblia NOT DETECTED NOT DETECTED Final   Adenovirus F40/41 NOT DETECTED NOT DETECTED Final   Astrovirus NOT DETECTED NOT DETECTED Final   Norovirus GI/GII NOT DETECTED NOT  DETECTED Final   Rotavirus A NOT DETECTED NOT DETECTED Final   Sapovirus (I, II, IV, and V) NOT DETECTED NOT DETECTED Final    Comment: Performed at [facility], [address] [place], [place] [address]     Labs: BNP (last 3 results) Recent Labs    06/16/23 0912 08/07/23 1248 08/10/23 0400  BNP 1,602.3* 4,285.0* >4,500.0*   Basic Metabolic Panel: Recent Labs  Lab 08/07/23 1248 08/08/23 0410 08/09/23 1229 08/10/23 0400 08/11/23 0303  NA 136 137 138 136 134*  K 3.9 3.5 3.4* 3.3* 3.9  CL 104 106 101 103 102  CO2 20* 19* 25 24 22   GLUCOSE 189* 111* 109* 110* 87  BUN 43* 39* 38* 37* 35*  CREATININE 2.07* 1.84* 1.98* 1.95* 1.82*  CALCIUM 8.3* 7.8* 8.1* 7.5* 7.7*  MG  --   --   --  1.4* 1.6*   Liver Function Tests: Recent Labs  Lab 08/07/23 1248 08/08/23 0410 08/09/23 1229  08/10/23 0400 08/11/23 0303  AST 42* 27 21 18 18   ALT 41 28 26 22 18   ALKPHOS 80 63 68 55 62  BILITOT 1.0 1.0 0.9 0.8 0.9  PROT 6.0* 4.9* 5.3* 4.4* 4.7*  ALBUMIN 3.3* 2.5* 2.8* 2.3* 2.5*   CBC: Recent Labs  Lab 08/07/23 1248 08/08/23 0410  WBC 4.9 4.6  NEUTROABS 3.7  --   HGB 12.6 11.0*  HCT 38.0 35.8*  MCV 91.6 96.5  PLT 204 182   BNP: Recent Labs  Lab 08/07/23 1248 08/10/23 0400  BNP 4,285.0* >4,500.0*   CBG: Recent Labs  Lab 08/10/23 0828 08/10/23 1220 08/10/23 1612 08/10/23 2138 08/11/23 0840  GLUCAP 103* 121* 119* 90 101*   Urinalysis    Component Value Date/Time   COLORURINE YELLOW (A) 08/07/2023 1737   APPEARANCEUR CLEAR (A) 08/07/2023 1737   APPEARANCEUR Clear 02/19/2013 0254   LABSPEC 1.015 08/07/2023 1737   LABSPEC 1.006 02/19/2013 0254   PHURINE 5.0 08/07/2023 1737   GLUCOSEU NEGATIVE 08/07/2023 1737   GLUCOSEU Negative 02/19/2013 0254   HGBUR NEGATIVE 08/07/2023 1737   BILIRUBINUR NEGATIVE 08/07/2023 1737   BILIRUBINUR Negative 02/19/2013 0254   KETONESUR NEGATIVE 08/07/2023 1737   PROTEINUR >=300 (A) 08/07/2023 1737   NITRITE NEGATIVE 08/07/2023 1737   LEUKOCYTESUR NEGATIVE 08/07/2023 1737   LEUKOCYTESUR 1+ 02/19/2013 0254   Sepsis Labs Recent Labs  Lab 08/07/23 1248 08/08/23 0410  WBC 4.9 4.6   Microbiology Recent Results (from the past 240 hours)  Culture, blood (Routine x 2)     Status: None (Preliminary result)   Collection Time: 08/07/23 12:48 PM   Specimen: BLOOD  Result Value Ref Range Status   Specimen Description BLOOD BLOOD RIGHT ARM  Final   Special Requests   Final    BOTTLES DRAWN AEROBIC ONLY Blood Culture results may not be optimal due to an inadequate volume of blood received in culture bottles   Culture   Final    NO GROWTH 4 DAYS Performed at [facility], [address], [place], [place] [address]    Report Status PENDING  Incomplete  Resp panel by RT-PCR (RSV, Flu A&B, Covid) Anterior Nasal Swab      Status: None   Collection Time: 08/07/23 12:48 PM   Specimen: Anterior Nasal Swab  Result Value Ref Range Status   SARS Coronavirus 2 by RT PCR NEGATIVE NEGATIVE Final    Comment: (NOTE) SARS-CoV-2 target nucleic acids are NOT DETECTED.  The SARS-CoV-2 RNA is generally detectable in  upper respiratory specimens during the acute phase of infection. The lowest concentration of SARS-CoV-2 viral copies this assay can detect is 138 copies/mL. A negative result does not preclude SARS-Cov-2 infection and should not be used as the sole basis for treatment or other patient management decisions. A negative result may occur with  improper specimen collection/handling, submission of specimen other than nasopharyngeal swab, presence of viral mutation(s) within the areas targeted by this assay, and inadequate number of viral copies(<138 copies/mL). A negative result must be combined with clinical observations, patient history, and epidemiological information. The expected result is Negative.  Fact Sheet for Patients:  BloggerCourse.com  Fact Sheet for Healthcare Providers:  SeriousBroker.it  This test is no t yet approved or cleared by the [place] FDA and  has been authorized for detection and/or diagnosis of SARS-CoV-2 by FDA under an Emergency Use Authorization (EUA). This EUA will remain  in effect (meaning this test can be used) for the duration of the COVID-19 declaration under Section 564(b)(1) of the Act, 21 U.S.C.section 360bbb-3(b)(1), unless the authorization is terminated  or revoked sooner.       Influenza A by PCR NEGATIVE NEGATIVE Final   Influenza B by PCR NEGATIVE NEGATIVE Final    Comment: (NOTE) The Xpert Xpress SARS-CoV-2/FLU/RSV plus assay is intended as an aid in the diagnosis of influenza from Nasopharyngeal swab specimens and should not be used as a sole basis for treatment. Nasal washings and aspirates are  unacceptable for Xpert Xpress SARS-CoV-2/FLU/RSV testing.  Fact Sheet for Patients: BloggerCourse.com  Fact Sheet for Healthcare Providers: SeriousBroker.it  This test is not yet approved or cleared by the [place] FDA and has been authorized for detection and/or diagnosis of SARS-CoV-2 by FDA under an Emergency Use Authorization (EUA). This EUA will remain in effect (meaning this test can be used) for the duration of the COVID-19 declaration under Section 564(b)(1) of the Act, 21 U.S.C. section 360bbb-3(b)(1), unless the authorization is terminated or revoked.     Resp Syncytial Virus by PCR NEGATIVE NEGATIVE Final    Comment: (NOTE) Fact Sheet for Patients: BloggerCourse.com  Fact Sheet for Healthcare Providers: SeriousBroker.it  This test is not yet approved or cleared by the [place] FDA and has been authorized for detection and/or diagnosis of SARS-CoV-2 by FDA under an Emergency Use Authorization (EUA). This EUA will remain in effect (meaning this test can be used) for the duration of the COVID-19 declaration under Section 564(b)(1) of the Act, 21 U.S.C. section 360bbb-3(b)(1), unless the authorization is terminated or revoked.  Performed at [facility], [address] [place], [place] [address]   Culture, blood (Routine x 2)     Status: None (Preliminary result)   Collection Time: 08/07/23  1:17 PM   Specimen: BLOOD  Result Value Ref Range Status   Specimen Description BLOOD BLOOD LEFT ARM  Final   Special Requests   Final    BOTTLES DRAWN AEROBIC AND ANAEROBIC Blood Culture results may not be optimal due to an inadequate volume of blood received in culture bottles   Culture   Final    NO GROWTH 4 DAYS Performed at [facility], [address], [place], [place] [address]    Report Status PENDING  Incomplete  Gastrointestinal Panel by  PCR , Stool     Status: None   Collection Time: 08/10/23 10:40 AM   Specimen: Stool  Result Value Ref Range Status   Campylobacter species NOT DETECTED NOT DETECTED Final  Plesimonas shigelloides NOT DETECTED NOT DETECTED Final   Salmonella species NOT DETECTED NOT DETECTED Final   Yersinia enterocolitica NOT DETECTED NOT DETECTED Final   Vibrio species NOT DETECTED NOT DETECTED Final   Vibrio cholerae NOT DETECTED NOT DETECTED Final   Enteroaggregative E coli (EAEC) NOT DETECTED NOT DETECTED Final   Enteropathogenic E coli (EPEC) NOT DETECTED NOT DETECTED Final   Enterotoxigenic E coli (ETEC) NOT DETECTED NOT DETECTED Final   Shiga like toxin producing E coli (STEC) NOT DETECTED NOT DETECTED Final   Shigella/Enteroinvasive E coli (EIEC) NOT DETECTED NOT DETECTED Final   Cryptosporidium NOT DETECTED NOT DETECTED Final   Cyclospora cayetanensis NOT DETECTED NOT DETECTED Final   Entamoeba histolytica NOT DETECTED NOT DETECTED Final   Giardia lamblia NOT DETECTED NOT DETECTED Final   Adenovirus F40/41 NOT DETECTED NOT DETECTED Final   Astrovirus NOT DETECTED NOT DETECTED Final   Norovirus GI/GII NOT DETECTED NOT DETECTED Final   Rotavirus A NOT DETECTED NOT DETECTED Final   Sapovirus (I, II, IV, and V) NOT DETECTED NOT DETECTED Final    Comment: Performed at [facility], [address], [place], [place] [address]    Procedures/Studies: DG Chest Port 1 View Result Date: 08/10/2023 CLINICAL DATA:  89 year old female history of acute on chronic diastolic congestive heart failure. EXAM: PORTABLE CHEST 1 VIEW COMPARISON:  Chest x-ray 08/07/2023. FINDINGS: Left-sided pacemaker device in place with lead tips projecting over the expected location of the right atrium and right ventricle. Lung volumes are normal. Bibasilar opacities are noted (left-greater-than-right) which may reflect areas of atelectasis and/or consolidation. Small right and moderate left pleural effusions. Diffuse  interstitial prominence and peribronchial cuffing. Cephalization of the pulmonary vasculature. Mild cardiomegaly. Upper mediastinal contours are within normal limits. Atherosclerotic calcifications are noted in the thoracic aorta. Status post right shoulder arthroplasty. IMPRESSION: 1. The appearance the chest is most compatible with acute congestive heart failure, as above. 2. Aortic atherosclerosis. Electronically Signed   By: Trudie Reed M.D.   On: 08/10/2023 11:33   ECHOCARDIOGRAM COMPLETE Result Date: 08/09/2023    ECHOCARDIOGRAM REPORT   Patient Name:   MYSTICA CEDERQUIST Rencher Date of Exam: 08/09/2023 Medical Rec #:  [id number]     Height:       69.0 in Accession #:    [id number]    Weight:       194.5 lb Date of Birth:  09/08/1932      BSA:          2.042 m Patient Age:    89 years      BP:           172/81 mmHg Patient Gender: F             HR:           62 bpm. Exam Location:  [facility] Procedure: 2D Echo, 3D Echo, Cardiac Doppler and Color Doppler Indications:     CHF  History:         Patient has prior history of Echocardiogram examinations, most                  recent 02/09/2023. CHF, Abnormal ECG, COPD, Arrythmias:Atrial                  Fibrillation, Signs/Symptoms:Dizziness/Lightheadedness; Risk                  Factors:Hypertension and Diabetes. CKD.  Sonographer:     Mikki Harbor Referring Phys:  [phone] Caya Soberanis  Caliah Kopke Diagnosing Phys: Debbe Odea MD IMPRESSIONS  1. Left ventricular ejection fraction, by estimation, is 25 to 30%. Left ventricular ejection fraction by 2D MOD biplane is 26.6 %. The left ventricle has severely decreased function. The left ventricle demonstrates global hypokinesis. There is mild left ventricular hypertrophy. Left ventricular diastolic parameters are indeterminate.  2. Right ventricular systolic function is normal. The right ventricular size is normal. There is mildly elevated pulmonary artery systolic pressure.  3. Left atrial size was moderately dilated.  4. The mitral valve  is degenerative. Moderate mitral valve regurgitation.  5. Tricuspid valve regurgitation is mild to moderate.  6. The aortic valve is calcified. Aortic valve regurgitation is not visualized. Aortic valve sclerosis/calcification is present, without any evidence of aortic stenosis. Aortic valve mean gradient measures 8.5 mmHg.  7. The inferior vena cava is dilated in size with >50% respiratory variability, suggesting right atrial pressure of 8 mmHg. FINDINGS  Left Ventricle: Left ventricular ejection fraction, by estimation, is 25 to 30%. Left ventricular ejection fraction by 2D MOD biplane is 26.6 %. The left ventricle has severely decreased function. The left ventricle demonstrates global hypokinesis. The left ventricular internal cavity size was normal in size. There is mild left ventricular hypertrophy. Left ventricular diastolic parameters are indeterminate. Right Ventricle: The right ventricular size is normal. No increase in right ventricular wall thickness. Right ventricular systolic function is normal. There is mildly elevated pulmonary artery systolic pressure. The tricuspid regurgitant velocity is 2.81  m/s, and with an assumed right atrial pressure of 8 mmHg, the estimated right ventricular systolic pressure is 39.6 mmHg. Left Atrium: Left atrial size was moderately dilated. Right Atrium: Right atrial size was normal in size. Pericardium: There is no evidence of pericardial effusion. Mitral Valve: The mitral valve is degenerative in appearance. Moderate mitral valve regurgitation. MV peak gradient, 8.4 mmHg. The mean mitral valve gradient is 3.0 mmHg. Tricuspid Valve: The tricuspid valve is normal in structure. Tricuspid valve regurgitation is mild to moderate. Aortic Valve: The aortic valve is calcified. Aortic valve regurgitation is not visualized. Aortic valve sclerosis/calcification is present, without any evidence of aortic stenosis. Aortic valve mean gradient measures 8.5 mmHg. Aortic valve peak  gradient measures 18.0 mmHg. Pulmonic Valve: The pulmonic valve was not well visualized. Pulmonic valve regurgitation is not visualized. Aorta: The aortic root is normal in size and structure. Venous: The inferior vena cava is dilated in size with greater than 50% respiratory variability, suggesting right atrial pressure of 8 mmHg. IAS/Shunts: No atrial level shunt detected by color flow Doppler.  LEFT VENTRICLE PLAX 2D                        Biplane EF (MOD) LVIDd:         5.00 cm         LV Biplane EF:   Left LVIDs:         3.70 cm                          ventricular LV PW:         1.20 cm                          ejection LV IVS:        1.50 cm  fraction by                                                 2D MOD                                                 biplane is LV Volumes (MOD)                                26.6 %. LV vol d, MOD    108.0 ml A2C:                           Diastology LV vol d, MOD    105.0 ml      LV e' medial:    3.48 cm/s A4C:                           LV E/e' medial:  19.4 LV vol s, MOD    79.4 ml       LV e' lateral:   9.79 cm/s A2C:                           LV E/e' lateral: 6.9 LV vol s, MOD    76.4 ml A4C: LV SV MOD A2C:   28.6 ml LV SV MOD A4C:   105.0 ml LV SV MOD BP:    28.3 ml RIGHT VENTRICLE RV Basal diam:  3.55 cm RV Mid diam:    3.00 cm RV S prime:     23.70 cm/s TAPSE (M-mode): 1.9 cm LEFT ATRIUM             Index        RIGHT ATRIUM           Index LA diam:        4.10 cm 2.01 cm/m   RA Area:     15.10 cm LA Vol (A2C):   87.3 ml 42.76 ml/m  RA Volume:   41.20 ml  20.18 ml/m LA Vol (A4C):   86.1 ml 42.17 ml/m LA Biplane Vol: 89.0 ml 43.59 ml/m  AORTIC VALVE                    PULMONIC VALVE AV Vmax:           212.00 cm/s  PV Vmax:       1.04 m/s AV Vmean:          127.500 cm/s PV Peak grad:  4.3 mmHg AV VTI:            0.420 m AV Peak Grad:      18.0 mmHg AV Mean Grad:      8.5 mmHg LVOT Vmax:         83.00 cm/s LVOT Vmean:        48.900 cm/s  LVOT VTI:          0.173 m LVOT/AV VTI ratio: 0.41 MITRAL VALVE  TRICUSPID VALVE MV Area (PHT): 6.02 cm     TR Peak grad:   31.6 mmHg MV Peak grad:  8.4 mmHg     TR Vmax:        281.00 cm/s MV Mean grad:  3.0 mmHg MV Vmax:       1.45 m/s     SHUNTS MV Vmean:      75.6 cm/s    Systemic VTI: 0.17 m MV Decel Time: 126 msec MV E velocity: 67.60 cm/s MV A velocity: 124.00 cm/s MV E/A ratio:  0.55 Debbe Odea MD Electronically signed by Debbe Odea MD Signature Date/Time: 08/09/2023/4:34:38 PM    Final    DG Chest Port 1 View Result Date: 08/07/2023 CLINICAL DATA:  [id number] Sepsis (HCC) [id number]. Respiratory distress. Difficulty breathing. EXAM: PORTABLE CHEST 1 VIEW COMPARISON:  06/16/2023. FINDINGS: Low lung volume. Redemonstration of left retrocardiac airspace opacity obscuring the left hemidiaphragm, descending thoracic aorta and blunting the left lateral costophrenic angle, suggesting combination of left lung atelectasis and/or consolidation with pleural effusion. There is interval worsening since the prior study. There is also probable small layering right pleural effusion. There is mild diffuse pulmonary vascular congestion. No pneumothorax. Stable cardio-mediastinal silhouette. There is a left sided 2-lead pacemaker. No acute osseous abnormalities. Right reverse shoulder arthroplasty noted. The soft tissues are within normal limits. IMPRESSION: *Findings favor congestive heart failure/pulmonary edema. *There is worsening left retrocardiac opacity, as described above. Electronically Signed   By: Jules Schick M.D.   On: 08/07/2023 15:40    Time coordinating discharge: 45 mins  SIGNED:  Carollee Herter, DO [facility] 08/11/23, 1:42 PM

## 2023-08-11 NOTE — Progress Notes (Signed)
Patient discharging home, all DC paperwork reviewed and sent with pt. Pt leaving with all personal belongings with daughter via private vehicle

## 2023-08-11 NOTE — Progress Notes (Signed)
PROGRESS NOTE    Leslie Parker  SWN:[id number] DOB: 17-Dec-1932 DOA: 08/07/2023 PCP: [name], MD  Subjective: Pt seen and examined. Remains on RA Discussed with cards yesterday. No Entresto due to CKD stage 3b. Restart lasix today. Cards will arrange f/u with EP/Cards to have PPM interrogated. Restart lasix 40 mg daily today. Diarrhea has improved. She is ready to go home today.   Hospital Course: HPI: Leslie Parker is a 89 y.o. female with medical history significant of chronic HFpEF, asthma, atrial fibrillation, stage III CKD type 2 diabetes, hypertension presented with acute respiratory failure with hypoxia, acute on chronic HFpEF and pneumonia.  Patient reports increased work of breathing over the past 1 to 2 days.  Positive cough.  Mild orthopnea PND.  No fevers or chills.  No chest pain.  No nausea or vomiting.  No focal hemiparesis or confusion.  Baseline medical issues including atrial fibrillation and type 2 diabetes.  No recent medication changes.  No reported recent sick contacts.  No reported alcohol or tobacco use. Presented to the ER afebrile, hemodynamically stable.  Satting in upper 80s on room air, transition to BiPAP and then ultimately 2 L nasal cannula to keep O2 sats greater than 93%.  White count 4.9, hemoglobin 12.6, platelets 204, VBG stable, creatinine 2.07, glucose 189, lactate 1.1.  COVID flu and RSV negative.  BNP of 4285 with baseline BNP around 1000.  Chest x-ray with CHF versus pulmonary edema as well as left retrocardiac pneumonia. EKG NSR.   Significant Events: Admitted 08/07/2023 for acute respiratory failure with hypoxia, pneumonia, acute on chronic diastolic CHF   Significant Labs: White count 4.9, hemoglobin 12.6, platelets 204, VBG stable, creatinine 2.07, glucose 189, lactate 1.1.  COVID flu and RSV negative.  BNP of 4285 with baseline BNP around 1000   Significant Imaging Studies: Chest x-ray with CHF versus pulmonary edema as well as left  retrocardiac pneumonia   Antibiotic Therapy: Anti-infectives (From admission, onward)    Start     Dose/Rate Route Frequency Ordered Stop   08/08/23 1000  cefTRIAXone (ROCEPHIN) 2 g in sodium chloride 0.9 % 100 mL IVPB        2 g 200 mL/hr over 30 Minutes Intravenous Every 24 hours 08/07/23 1626 08/12/23 0959   08/08/23 1000  azithromycin (ZITHROMAX) 500 mg in sodium chloride 0.9 % 250 mL IVPB        500 mg 250 mL/hr over 60 Minutes Intravenous Every 24 hours 08/07/23 1626 08/12/23 0959   08/07/23 1300  cefTRIAXone (ROCEPHIN) 2 g in sodium chloride 0.9 % 100 mL IVPB        2 g 200 mL/hr over 30 Minutes Intravenous Once 08/07/23 1250 08/07/23 1357   08/07/23 1300  azithromycin (ZITHROMAX) 500 mg in sodium chloride 0.9 % 250 mL IVPB        500 mg 250 mL/hr over 60 Minutes Intravenous  Once 08/07/23 1250 08/07/23 1503       Procedures:   Consultants:     Assessment and Plan: * Acute respiratory failure with hypoxia (HCC) On admission, pt was diagnosed with Pneumonia, Acute on chronic HFpEF. Decompensated resp failure requiring 2 L and cannula on presentation. Likely secondary to overlapping pneumonia as well as acute on chronic HFpEF. BNP 4285 which is well above baseline. Chest x-ray with CHF and left retrocardiac pneumonia. IV Lasix. IV Rocephin azithromycin for infectious coverage. Blood and respiratory cultures.Monitor respiratory status appropriately. 08-08-2023 pt has been weaned to RA.  Pt  remains on IV lasix. Pt has normal WBC and no fevers.  Have ordered procalcitonin but just found out that lab machine for procalcitonin is broken. I do not strongly feel she has pneumonia given how fast her hypoxia has cleared. Will leave abx going for 1 more day. Hopefully procalcitonin lab will be able to be run tomorrow. I think her acute respiratory failure with hypoxia was due to pulmonary edema from acute on chronic diastolic CHF. 08-09-2023 hypoxia has resolved. Due to CHF, not  pneumonia. Pneumonia has been rule out. Stop abx. 08-10-2023 diarrhea started in ER. Could be from abx that were started in ER. However, given recent outbreak of Nori virus, will check diarrhea panel.  08-11-2023 GI diarrhea panel negative. Likely diarrhea from short course of abx. Diarrhea has improved.  Acute combined systolic (congestive) and diastolic (congestive) heart failure (HCC) - LVEF 25% 08-10-2023 echo yesterday showed LVEF 25-30%. Back in July 2024, LVEF was 60-65%. BP is much improved since yesterday. On RA. [facility] cardiology consulted to assist. Don't she is volume overloaded currently.  PCXR today shows improved aeration since admission. She's had about 3.8 liters in urine output since admission 3 days ago. Baseline HR in the low 60s. On coreg 3.125 mg bid. Not much room to increase betablocker.  On hydralazine 50 mg tid and imdur 30 mg at bedtime. She has CKD stage 3b at baseline.  08-11-2023 no Entresto/ARB/ACEI/aldactone due to CKD stage 3b per my discussion with cardiology. F/u with EP/Cards in the office for PPM interrogation.  Dc to home with coreg 6.25 mg bid, hydralazine 50 mg tid, imdur 30 mg at bedtime, lasix 40 mg qam. Added Jardiance 10 mg qday.  Acute on chronic diastolic CHF (congestive heart failure) (HCC) On admission, 2D echo July 2024 with EF 60 to 65% and grade 1 diastolic dysfunction. BNP 4285 today and CHF pattern. S/p 20mg  IV lasix in the ER. Will acutely increase home lasix 20mg  daily-->40mg  daily. Weight today 88kg. Strict Is and Os and daily weights. Cardiology consult as clinically indicated.  08-08-2023 continue with lasix 40 mg IV q12h. Continue with coreg 3.125 mg bid.  Add Jardiance 10 mg qday.  Pt denies any hx of frequent UTIs. Monitor Scr. Was seen by outpt cardiology in 06-2023. No mention as to why pt is not on GDMT. Specifically not on Entresto.  Possibly due to CKD stage 3b. Will need referral back to cards clinic for them to decide if this is  appropriate for patient. 08-09-2023 awaiting labs. Possible change to po lasix. 08-10-2023 lasix stopped yesterday due to rising Scr. Pt looks euvolemic now. Only trace pretibial edema. LVEF decreased now to 25%. [facility] cards consulted today.  08-11-2023 pt now with combined systolic/diastolic CHF. See below  Type II diabetes mellitus with renal manifestations (HCC) On admission, Blood sugar 180s. SSI. A1C. Monitor  08-08-2023 will order A1C. CBG acceptable ranges. Continue with SSI. Add Jardiance for additional diuretic effect. Should also help with CBG. 08-09-2023 stable. CBG acceptable ranges. 08-10-2023 stable. CBG acceptable 08-11-2023 stable. Continue Jardiance   Sick sinus syndrome (HCC) Chronic PPM. Cards will arrange for outpatient appointment with EP/Cards to have PPM interrogated.  COPD (chronic obstructive pulmonary disease) (HCC) On admission, Appears fairly stable from a respiratory standpoint. Cont prn inhalers  08-08-2023 stable. Not exacerbated. 08-09-2023 stable.  08-10-2023 stable. 08-11-2023 stable. Continue home inhalers of Trelegy(100-62.5-25) 1 puff bid  Chronic a-fib (HCC) On admission, Rate controlled at present. Cont home regimen including amiodarone and eliquis  08-08-2023 stable. Continue coreg 3.125 mg bid, eliquis 5 mg bid, amiodarone 200 mg daily.  EKG on admission showed NSR. 08-09-2023 stable. 08-10-2023 stable.  08-11-2023. Has PPM for SSS. DC to home on coreg 6.125 mg bid, continue Eliquis 5 mg bid. Amiodarone 200 mg every day.   Stage 3b chronic kidney disease (HCC) - Baseline scr 1.8-2.0 On admission, Cr 2 w/ GFR in 20s. Appears near baseline. Monitor  08-08-2023 Scr 1.84, BUN 39 with diuresis. Continue to monitor during diuresis. Baseline scr 1.8-2.0 08-09-2023 awaiting AM labs. 08-10-2023 Scr up to 1.95 with diuresis. Last dose of Lasix yesterday around 10 AM.  08-11-2023 Scr stable at 1.82, BUN 35. Cards wants to go to home on lasix 40  mg daily. Restart today.  Essential hypertension 08-08-2023 on coreg 3.125 mg bid. Added IV lasix 40 mg bid. May need additional BP control. 08-09-2023 needs additional BP control. Given CKD stage 3b, will add hydralazine 50 mg tid. HR in low 60s on coreg 3.125 mg bid. 08-10-2023 BP has improved. Currently on coreg 3.125 mg bid(limited to increasing due to baseline HR of low 60s), hydralazine 50 mg tid, Imdur 30 mg at bedtime.  08-11-2023 DC to home on coreg 6.125 mg bid, hydralazine 50 mg tid, imdur 30 mg at bedtime, lasix 40 mg qam, jardiance 10 mg qday.  Hypokalemia 08-10-2023 replete with po kcl. 08-11-2023 resolved.  Hypomagnesemia 08-10-2023 replete with IV mg 08-11-2023 give another 2 gram IV prior to DC.   DVT prophylaxis: apixaban (ELIQUIS) tablet 2.5 mg Start: 08/07/23 2200 apixaban (ELIQUIS) tablet 2.5 mg     Code Status: Full Code Family Communication: no family at bedside. Pt is decisional. Disposition Plan: return home Reason for continuing need for hospitalization: medically stable for DC.  Objective: Vitals:   08/10/23 2135 08/11/23 0014 08/11/23 0526 08/11/23 0835  BP: (!) 145/80 139/73 (!) 159/69 (!) 186/85  Pulse: 61 62 65 75  Resp: 20 18 18 18   Temp: 98 F (36.7 C) 98.3 F (36.8 C) 97.9 F (36.6 C) 99.1 F (37.3 C)  TempSrc: Oral Oral Oral Oral  SpO2: 94% 97% 94% 94%  Weight:      Height:        Intake/Output Summary (Last 24 hours) at 08/11/2023 0848 Last data filed at 08/11/2023 0647 Gross per 24 hour  Intake 240 ml  Output 250 ml  Net -10 ml   Filed Weights   08/07/23 1242  Weight: 88.2 kg    Examination:  Physical Exam Vitals and nursing note reviewed.  HENT:     Head: Normocephalic and atraumatic.     Nose: Nose normal.  Eyes:     General: No scleral icterus. Cardiovascular:     Rate and Rhythm: Normal rate and regular rhythm.  Pulmonary:     Effort: Pulmonary effort is normal. No respiratory distress.     Breath sounds:  Normal breath sounds.  Abdominal:     General: Bowel sounds are normal.     Palpations: Abdomen is soft.  Musculoskeletal:     Right lower leg: No edema.     Left lower leg: No edema.     Comments: LE edema has resolved completely  Skin:    General: Skin is warm and dry.     Capillary Refill: Capillary refill takes less than 2 seconds.  Neurological:     Mental Status: She is alert and oriented to person, place, and time.     Data Reviewed: I have personally reviewed  following labs and imaging studies  CBC: Recent Labs  Lab 08/07/23 1248 08/08/23 0410  WBC 4.9 4.6  NEUTROABS 3.7  --   HGB 12.6 11.0*  HCT 38.0 35.8*  MCV 91.6 96.5  PLT 204 182   Basic Metabolic Panel: Recent Labs  Lab 08/07/23 1248 08/08/23 0410 08/09/23 1229 08/10/23 0400 08/11/23 0303  NA 136 137 138 136 134*  K 3.9 3.5 3.4* 3.3* 3.9  CL 104 106 101 103 102  CO2 20* 19* 25 24 22   GLUCOSE 189* 111* 109* 110* 87  BUN 43* 39* 38* 37* 35*  CREATININE 2.07* 1.84* 1.98* 1.95* 1.82*  CALCIUM 8.3* 7.8* 8.1* 7.5* 7.7*  MG  --   --   --  1.4* 1.6*   GFR: Estimated Creatinine Clearance: 25.3 mL/min (A) (by C-G formula based on SCr of 1.82 mg/dL (H)). Liver Function Tests: Recent Labs  Lab 08/07/23 1248 08/08/23 0410 08/09/23 1229 08/10/23 0400 08/11/23 0303  AST 42* 27 21 18 18   ALT 41 28 26 22 18   ALKPHOS 80 63 68 55 62  BILITOT 1.0 1.0 0.9 0.8 0.9  PROT 6.0* 4.9* 5.3* 4.4* 4.7*  ALBUMIN 3.3* 2.5* 2.8* 2.3* 2.5*   Coagulation Profile: Recent Labs  Lab 08/07/23 1248  INR 1.1   BNP (last 3 results) Recent Labs    06/16/23 0912 08/07/23 1248 08/10/23 0400  BNP 1,602.3* 4,285.0* >4,500.0*   CBG: Recent Labs  Lab 08/09/23 1953 08/10/23 0828 08/10/23 1220 08/10/23 1612 08/10/23 2138  GLUCAP 151* 103* 121* 119* 90   Sepsis Labs: Recent Labs  Lab 08/07/23 1248 08/08/23 0903  PROCALCITON  --  <0.10  LATICACIDVEN 1.1  --     Recent Results (from the past 240 hours)   Culture, blood (Routine x 2)     Status: None (Preliminary result)   Collection Time: 08/07/23 12:48 PM   Specimen: BLOOD  Result Value Ref Range Status   Specimen Description BLOOD BLOOD RIGHT ARM  Final   Special Requests   Final    BOTTLES DRAWN AEROBIC ONLY Blood Culture results may not be optimal due to an inadequate volume of blood received in culture bottles   Culture   Final    NO GROWTH 4 DAYS Performed at [facility], [address] [place], [place] [address]    Report Status PENDING  Incomplete  Resp panel by RT-PCR (RSV, Flu A&B, Covid) Anterior Nasal Swab     Status: None   Collection Time: 08/07/23 12:48 PM   Specimen: Anterior Nasal Swab  Result Value Ref Range Status   SARS Coronavirus 2 by RT PCR NEGATIVE NEGATIVE Final    Comment: (NOTE) SARS-CoV-2 target nucleic acids are NOT DETECTED.  The SARS-CoV-2 RNA is generally detectable in upper respiratory specimens during the acute phase of infection. The lowest concentration of SARS-CoV-2 viral copies this assay can detect is 138 copies/mL. A negative result does not preclude SARS-Cov-2 infection and should not be used as the sole basis for treatment or other patient management decisions. A negative result may occur with  improper specimen collection/handling, submission of specimen other than nasopharyngeal swab, presence of viral mutation(s) within the areas targeted by this assay, and inadequate number of viral copies(<138 copies/mL). A negative result must be combined with clinical observations, patient history, and epidemiological information. The expected result is Negative.  Fact Sheet for Patients:  BloggerCourse.com  Fact Sheet for Healthcare Providers:  SeriousBroker.it  This test is no t yet approved or  cleared by the [place] and  has been authorized for detection and/or diagnosis of SARS-CoV-2 by FDA under an Emergency Use  Authorization (EUA). This EUA will remain  in effect (meaning this test can be used) for the duration of the COVID-19 declaration under Section 564(b)(1) of the Act, 21 U.S.C.section 360bbb-3(b)(1), unless the authorization is terminated  or revoked sooner.       Influenza A by PCR NEGATIVE NEGATIVE Final   Influenza B by PCR NEGATIVE NEGATIVE Final    Comment: (NOTE) The Xpert Xpress SARS-CoV-2/FLU/RSV plus assay is intended as an aid in the diagnosis of influenza from Nasopharyngeal swab specimens and should not be used as a sole basis for treatment. Nasal washings and aspirates are unacceptable for Xpert Xpress SARS-CoV-2/FLU/RSV testing.  Fact Sheet for Patients: BloggerCourse.com  Fact Sheet for Healthcare Providers: SeriousBroker.it  This test is not yet approved or cleared by the [place] FDA and has been authorized for detection and/or diagnosis of SARS-CoV-2 by FDA under an Emergency Use Authorization (EUA). This EUA will remain in effect (meaning this test can be used) for the duration of the COVID-19 declaration under Section 564(b)(1) of the Act, 21 U.S.C. section 360bbb-3(b)(1), unless the authorization is terminated or revoked.     Resp Syncytial Virus by PCR NEGATIVE NEGATIVE Final    Comment: (NOTE) Fact Sheet for Patients: BloggerCourse.com  Fact Sheet for Healthcare Providers: SeriousBroker.it  This test is not yet approved or cleared by the [place] FDA and has been authorized for detection and/or diagnosis of SARS-CoV-2 by FDA under an Emergency Use Authorization (EUA). This EUA will remain in effect (meaning this test can be used) for the duration of the COVID-19 declaration under Section 564(b)(1) of the Act, 21 U.S.C. section 360bbb-3(b)(1), unless the authorization is terminated or revoked.  Performed at [facility], [address] [place], [place] [address]   Culture, blood (Routine x 2)     Status: None (Preliminary result)   Collection Time: 08/07/23  1:17 PM   Specimen: BLOOD  Result Value Ref Range Status   Specimen Description BLOOD BLOOD LEFT ARM  Final   Special Requests   Final    BOTTLES DRAWN AEROBIC AND ANAEROBIC Blood Culture results may not be optimal due to an inadequate volume of blood received in culture bottles   Culture   Final    NO GROWTH 4 DAYS Performed at [facility], [address] [place], [place] [address]    Report Status PENDING  Incomplete  Gastrointestinal Panel by PCR , Stool     Status: None   Collection Time: 08/10/23 10:40 AM   Specimen: Stool  Result Value Ref Range Status   Campylobacter species NOT DETECTED NOT DETECTED Final   Plesimonas shigelloides NOT DETECTED NOT DETECTED Final   Salmonella species NOT DETECTED NOT DETECTED Final   Yersinia enterocolitica NOT DETECTED NOT DETECTED Final   Vibrio species NOT DETECTED NOT DETECTED Final   Vibrio cholerae NOT DETECTED NOT DETECTED Final   Enteroaggregative E coli (EAEC) NOT DETECTED NOT DETECTED Final   Enteropathogenic E coli (EPEC) NOT DETECTED NOT DETECTED Final   Enterotoxigenic E coli (ETEC) NOT DETECTED NOT DETECTED Final   Shiga like toxin producing E coli (STEC) NOT DETECTED NOT DETECTED Final   Shigella/Enteroinvasive E coli (EIEC) NOT DETECTED NOT DETECTED Final   Cryptosporidium NOT DETECTED NOT DETECTED Final   Cyclospora cayetanensis NOT DETECTED NOT DETECTED Final   Entamoeba histolytica NOT DETECTED NOT  DETECTED Final   Giardia lamblia NOT DETECTED NOT DETECTED Final   Adenovirus F40/41 NOT DETECTED NOT DETECTED Final   Astrovirus NOT DETECTED NOT DETECTED Final   Norovirus GI/GII NOT DETECTED NOT DETECTED Final   Rotavirus A NOT DETECTED NOT DETECTED Final   Sapovirus (I, II, IV, and V) NOT DETECTED NOT DETECTED Final    Comment: Performed at [facility], [address], [place], [place] [address]     Radiology Studies: DG Chest Port 1 View Result Date: 08/10/2023 CLINICAL DATA:  89 year old female history of acute on chronic diastolic congestive heart failure. EXAM: PORTABLE CHEST 1 VIEW COMPARISON:  Chest x-ray 08/07/2023. FINDINGS: Left-sided pacemaker device in place with lead tips projecting over the expected location of the right atrium and right ventricle. Lung volumes are normal. Bibasilar opacities are noted (left-greater-than-right) which may reflect areas of atelectasis and/or consolidation. Small right and moderate left pleural effusions. Diffuse interstitial prominence and peribronchial cuffing. Cephalization of the pulmonary vasculature. Mild cardiomegaly. Upper mediastinal contours are within normal limits. Atherosclerotic calcifications are noted in the thoracic aorta. Status post right shoulder arthroplasty. IMPRESSION: 1. The appearance the chest is most compatible with acute congestive heart failure, as above. 2. Aortic atherosclerosis. Electronically Signed   By: Trudie Reed M.D.   On: 08/10/2023 11:33   ECHOCARDIOGRAM COMPLETE Result Date: 08/09/2023    ECHOCARDIOGRAM REPORT   Patient Name:   Leslie Parker [place] Date of Exam: 08/09/2023 Medical Rec #:  [id number]     Height:       69.0 in Accession #:    [id number]    Weight:       194.5 lb Date of Birth:  1933/04/23      BSA:          2.042 m Patient Age:    89 years      BP:           172/81 mmHg Patient Gender: F             HR:           62 bpm. Exam Location:  [facility] Procedure: 2D Echo, 3D Echo, Cardiac Doppler and Color Doppler Indications:     CHF  History:         Patient has prior history of Echocardiogram examinations, most                  recent 02/09/2023. CHF, Abnormal ECG, COPD, Arrythmias:Atrial                  Fibrillation, Signs/Symptoms:Dizziness/Lightheadedness; Risk                  Factors:Hypertension and Diabetes. CKD.  Sonographer:     Mikki Harbor Referring Phys:  [id number] Jarmon Javid  Diagnosing Phys: Debbe Odea MD IMPRESSIONS  1. Left ventricular ejection fraction, by estimation, is 25 to 30%. Left ventricular ejection fraction by 2D MOD biplane is 26.6 %. The left ventricle has severely decreased function. The left ventricle demonstrates global hypokinesis. There is mild left ventricular hypertrophy. Left ventricular diastolic parameters are indeterminate.  2. Right ventricular systolic function is normal. The right ventricular size is normal. There is mildly elevated pulmonary artery systolic pressure.  3. Left atrial size was moderately dilated.  4. The mitral valve is degenerative. Moderate mitral valve regurgitation.  5. Tricuspid valve regurgitation is mild to moderate.  6. The aortic valve is calcified. Aortic valve regurgitation is not visualized. Aortic valve sclerosis/calcification is  present, without any evidence of aortic stenosis. Aortic valve mean gradient measures 8.5 mmHg.  7. The inferior vena cava is dilated in size with >50% respiratory variability, suggesting right atrial pressure of 8 mmHg. FINDINGS  Left Ventricle: Left ventricular ejection fraction, by estimation, is 25 to 30%. Left ventricular ejection fraction by 2D MOD biplane is 26.6 %. The left ventricle has severely decreased function. The left ventricle demonstrates global hypokinesis. The left ventricular internal cavity size was normal in size. There is mild left ventricular hypertrophy. Left ventricular diastolic parameters are indeterminate. Right Ventricle: The right ventricular size is normal. No increase in right ventricular wall thickness. Right ventricular systolic function is normal. There is mildly elevated pulmonary artery systolic pressure. The tricuspid regurgitant velocity is 2.81  m/s, and with an assumed right atrial pressure of 8 mmHg, the estimated right ventricular systolic pressure is 39.6 mmHg. Left Atrium: Left atrial size was moderately dilated. Right Atrium: Right atrial size was  normal in size. Pericardium: There is no evidence of pericardial effusion. Mitral Valve: The mitral valve is degenerative in appearance. Moderate mitral valve regurgitation. MV peak gradient, 8.4 mmHg. The mean mitral valve gradient is 3.0 mmHg. Tricuspid Valve: The tricuspid valve is normal in structure. Tricuspid valve regurgitation is mild to moderate. Aortic Valve: The aortic valve is calcified. Aortic valve regurgitation is not visualized. Aortic valve sclerosis/calcification is present, without any evidence of aortic stenosis. Aortic valve mean gradient measures 8.5 mmHg. Aortic valve peak gradient measures 18.0 mmHg. Pulmonic Valve: The pulmonic valve was not well visualized. Pulmonic valve regurgitation is not visualized. Aorta: The aortic root is normal in size and structure. Venous: The inferior vena cava is dilated in size with greater than 50% respiratory variability, suggesting right atrial pressure of 8 mmHg. IAS/Shunts: No atrial level shunt detected by color flow Doppler.  LEFT VENTRICLE PLAX 2D                        Biplane EF (MOD) LVIDd:         5.00 cm         LV Biplane EF:   Left LVIDs:         3.70 cm                          ventricular LV PW:         1.20 cm                          ejection LV IVS:        1.50 cm                          fraction by                                                 2D MOD                                                 biplane is LV Volumes (MOD)  26.6 %. LV vol d, MOD    108.0 ml A2C:                           Diastology LV vol d, MOD    105.0 ml      LV e' medial:    3.48 cm/s A4C:                           LV E/e' medial:  19.4 LV vol s, MOD    79.4 ml       LV e' lateral:   9.79 cm/s A2C:                           LV E/e' lateral: 6.9 LV vol s, MOD    76.4 ml A4C: LV SV MOD A2C:   28.6 ml LV SV MOD A4C:   105.0 ml LV SV MOD BP:    28.3 ml RIGHT VENTRICLE RV Basal diam:  3.55 cm RV Mid diam:    3.00 cm RV S prime:     23.70  cm/s TAPSE (M-mode): 1.9 cm LEFT ATRIUM             Index        RIGHT ATRIUM           Index LA diam:        4.10 cm 2.01 cm/m   RA Area:     15.10 cm LA Vol (A2C):   87.3 ml 42.76 ml/m  RA Volume:   41.20 ml  20.18 ml/m LA Vol (A4C):   86.1 ml 42.17 ml/m LA Biplane Vol: 89.0 ml 43.59 ml/m  AORTIC VALVE                    PULMONIC VALVE AV Vmax:           212.00 cm/s  PV Vmax:       1.04 m/s AV Vmean:          127.500 cm/s PV Peak grad:  4.3 mmHg AV VTI:            0.420 m AV Peak Grad:      18.0 mmHg AV Mean Grad:      8.5 mmHg LVOT Vmax:         83.00 cm/s LVOT Vmean:        48.900 cm/s LVOT VTI:          0.173 m LVOT/AV VTI ratio: 0.41 MITRAL VALVE                TRICUSPID VALVE MV Area (PHT): 6.02 cm     TR Peak grad:   31.6 mmHg MV Peak grad:  8.4 mmHg     TR Vmax:        281.00 cm/s MV Mean grad:  3.0 mmHg MV Vmax:       1.45 m/s     SHUNTS MV Vmean:      75.6 cm/s    Systemic VTI: 0.17 m MV Decel Time: 126 msec MV E velocity: 67.60 cm/s MV A velocity: 124.00 cm/s MV E/A ratio:  0.55 Debbe Odea MD Electronically signed by Debbe Odea MD Signature Date/Time: 08/09/2023/4:34:38 PM    Final     Scheduled Meds:  amiodarone  200 mg Oral Daily   apixaban  2.5 mg Oral BID  carvedilol  6.25 mg Oral BID WC   empagliflozin  10 mg Oral Daily   furosemide  40 mg Oral Daily   hydrALAZINE  50 mg Oral Q8H   insulin aspart  0-9 Units Subcutaneous TID WC   isosorbide mononitrate  30 mg Oral QHS   Continuous Infusions:  magnesium sulfate bolus IVPB 2 g (08/11/23 0829)     LOS: 4 days   Time spent: 40 minutes  Carollee Herter, DO  [facility]  08/11/2023, 8:48 AM

## 2023-08-12 ENCOUNTER — Encounter: Payer: Self-pay | Admitting: Family Medicine

## 2023-08-12 ENCOUNTER — Inpatient Hospital Stay
Admission: EM | Admit: 2023-08-12 | Discharge: 2023-08-16 | DRG: 392 | Disposition: A | Discharge status: Home / Self Care | Payer: Medicare HMO | Attending: Internal Medicine | Admitting: Internal Medicine

## 2023-08-12 ENCOUNTER — Emergency Department: Payer: Medicare HMO

## 2023-08-12 ENCOUNTER — Other Ambulatory Visit: Payer: Self-pay

## 2023-08-12 DIAGNOSIS — K5720 Diverticulitis of large intestine with perforation and abscess without bleeding: Secondary | ICD-10-CM | POA: Diagnosis not present

## 2023-08-12 DIAGNOSIS — Z8249 Family history of ischemic heart disease and other diseases of the circulatory system: Secondary | ICD-10-CM | POA: Diagnosis not present

## 2023-08-12 DIAGNOSIS — K5732 Diverticulitis of large intestine without perforation or abscess without bleeding: Principal | ICD-10-CM | POA: Diagnosis present

## 2023-08-12 DIAGNOSIS — Z96611 Presence of right artificial shoulder joint: Secondary | ICD-10-CM | POA: Diagnosis present

## 2023-08-12 DIAGNOSIS — I5022 Chronic systolic (congestive) heart failure: Secondary | ICD-10-CM | POA: Diagnosis present

## 2023-08-12 DIAGNOSIS — I495 Sick sinus syndrome: Secondary | ICD-10-CM | POA: Diagnosis not present

## 2023-08-12 DIAGNOSIS — E119 Type 2 diabetes mellitus without complications: Secondary | ICD-10-CM

## 2023-08-12 DIAGNOSIS — Z96653 Presence of artificial knee joint, bilateral: Secondary | ICD-10-CM | POA: Diagnosis present

## 2023-08-12 DIAGNOSIS — N179 Acute kidney failure, unspecified: Secondary | ICD-10-CM | POA: Diagnosis not present

## 2023-08-12 DIAGNOSIS — I5020 Unspecified systolic (congestive) heart failure: Secondary | ICD-10-CM | POA: Diagnosis not present

## 2023-08-12 DIAGNOSIS — E1169 Type 2 diabetes mellitus with other specified complication: Secondary | ICD-10-CM | POA: Diagnosis not present

## 2023-08-12 DIAGNOSIS — Z7901 Long term (current) use of anticoagulants: Secondary | ICD-10-CM

## 2023-08-12 DIAGNOSIS — R110 Nausea: Secondary | ICD-10-CM

## 2023-08-12 DIAGNOSIS — R935 Abnormal findings on diagnostic imaging of other abdominal regions, including retroperitoneum: Secondary | ICD-10-CM

## 2023-08-12 DIAGNOSIS — Z9842 Cataract extraction status, left eye: Secondary | ICD-10-CM | POA: Diagnosis not present

## 2023-08-12 DIAGNOSIS — E871 Hypo-osmolality and hyponatremia: Secondary | ICD-10-CM | POA: Diagnosis present

## 2023-08-12 DIAGNOSIS — K56609 Unspecified intestinal obstruction, unspecified as to partial versus complete obstruction: Secondary | ICD-10-CM | POA: Insufficient documentation

## 2023-08-12 DIAGNOSIS — K567 Ileus, unspecified: Secondary | ICD-10-CM | POA: Diagnosis not present

## 2023-08-12 DIAGNOSIS — R531 Weakness: Secondary | ICD-10-CM | POA: Diagnosis not present

## 2023-08-12 DIAGNOSIS — Z9841 Cataract extraction status, right eye: Secondary | ICD-10-CM | POA: Diagnosis not present

## 2023-08-12 DIAGNOSIS — K5730 Diverticulosis of large intestine without perforation or abscess without bleeding: Secondary | ICD-10-CM | POA: Diagnosis not present

## 2023-08-12 DIAGNOSIS — Z79899 Other long term (current) drug therapy: Secondary | ICD-10-CM

## 2023-08-12 DIAGNOSIS — E876 Hypokalemia: Secondary | ICD-10-CM | POA: Diagnosis present

## 2023-08-12 DIAGNOSIS — N184 Chronic kidney disease, stage 4 (severe): Secondary | ICD-10-CM | POA: Diagnosis not present

## 2023-08-12 DIAGNOSIS — I480 Paroxysmal atrial fibrillation: Secondary | ICD-10-CM | POA: Diagnosis not present

## 2023-08-12 DIAGNOSIS — J449 Chronic obstructive pulmonary disease, unspecified: Secondary | ICD-10-CM | POA: Diagnosis present

## 2023-08-12 DIAGNOSIS — I4891 Unspecified atrial fibrillation: Secondary | ICD-10-CM

## 2023-08-12 DIAGNOSIS — Z825 Family history of asthma and other chronic lower respiratory diseases: Secondary | ICD-10-CM

## 2023-08-12 DIAGNOSIS — I130 Hypertensive heart and chronic kidney disease with heart failure and stage 1 through stage 4 chronic kidney disease, or unspecified chronic kidney disease: Secondary | ICD-10-CM | POA: Diagnosis not present

## 2023-08-12 DIAGNOSIS — N281 Cyst of kidney, acquired: Secondary | ICD-10-CM | POA: Diagnosis not present

## 2023-08-12 DIAGNOSIS — Z888 Allergy status to other drugs, medicaments and biological substances status: Secondary | ICD-10-CM

## 2023-08-12 DIAGNOSIS — Z7951 Long term (current) use of inhaled steroids: Secondary | ICD-10-CM | POA: Diagnosis not present

## 2023-08-12 DIAGNOSIS — R1111 Vomiting without nausea: Secondary | ICD-10-CM | POA: Diagnosis not present

## 2023-08-12 DIAGNOSIS — Z7984 Long term (current) use of oral hypoglycemic drugs: Secondary | ICD-10-CM

## 2023-08-12 DIAGNOSIS — Z961 Presence of intraocular lens: Secondary | ICD-10-CM | POA: Diagnosis present

## 2023-08-12 DIAGNOSIS — Z950 Presence of cardiac pacemaker: Secondary | ICD-10-CM

## 2023-08-12 DIAGNOSIS — D631 Anemia in chronic kidney disease: Secondary | ICD-10-CM | POA: Diagnosis not present

## 2023-08-12 DIAGNOSIS — I5033 Acute on chronic diastolic (congestive) heart failure: Secondary | ICD-10-CM | POA: Diagnosis not present

## 2023-08-12 DIAGNOSIS — J45909 Unspecified asthma, uncomplicated: Secondary | ICD-10-CM | POA: Diagnosis not present

## 2023-08-12 DIAGNOSIS — E1122 Type 2 diabetes mellitus with diabetic chronic kidney disease: Secondary | ICD-10-CM | POA: Diagnosis not present

## 2023-08-12 DIAGNOSIS — I10 Essential (primary) hypertension: Secondary | ICD-10-CM | POA: Diagnosis present

## 2023-08-12 DIAGNOSIS — K6389 Other specified diseases of intestine: Secondary | ICD-10-CM | POA: Diagnosis not present

## 2023-08-12 DIAGNOSIS — I110 Hypertensive heart disease with heart failure: Secondary | ICD-10-CM | POA: Diagnosis not present

## 2023-08-12 DIAGNOSIS — J4489 Other specified chronic obstructive pulmonary disease: Secondary | ICD-10-CM | POA: Diagnosis present

## 2023-08-12 DIAGNOSIS — K5792 Diverticulitis of intestine, part unspecified, without perforation or abscess without bleeding: Secondary | ICD-10-CM | POA: Diagnosis not present

## 2023-08-12 DIAGNOSIS — J9601 Acute respiratory failure with hypoxia: Secondary | ICD-10-CM | POA: Diagnosis not present

## 2023-08-12 DIAGNOSIS — N289 Disorder of kidney and ureter, unspecified: Secondary | ICD-10-CM | POA: Diagnosis not present

## 2023-08-12 LAB — COMPREHENSIVE METABOLIC PANEL
ALT: 21 U/L (ref 0–44)
AST: 25 U/L (ref 15–41)
Albumin: 2.8 g/dL — ABNORMAL LOW (ref 3.5–5.0)
Alkaline Phosphatase: 73 U/L (ref 38–126)
Anion gap: 14 (ref 5–15)
BUN: 37 mg/dL — ABNORMAL HIGH (ref 8–23)
CO2: 17 mmol/L — ABNORMAL LOW (ref 22–32)
Calcium: 8.2 mg/dL — ABNORMAL LOW (ref 8.9–10.3)
Chloride: 103 mmol/L (ref 98–111)
Creatinine, Ser: 1.88 mg/dL — ABNORMAL HIGH (ref 0.44–1.00)
GFR, Estimated: 25 mL/min — ABNORMAL LOW (ref >60)
Glucose, Bld: 118 mg/dL — ABNORMAL HIGH (ref 70–99)
Potassium: 3.6 mmol/L (ref 3.5–5.1)
Sodium: 134 mmol/L — ABNORMAL LOW (ref 135–145)
Total Bilirubin: 1.1 mg/dL (ref 0.0–1.2)
Total Protein: 5.5 g/dL — ABNORMAL LOW (ref 6.5–8.1)

## 2023-08-12 LAB — CULTURE, BLOOD (ROUTINE X 2)
Culture: NO GROWTH
Culture: NO GROWTH

## 2023-08-12 LAB — GLUCOSE, CAPILLARY: Glucose-Capillary: 101 mg/dL — ABNORMAL HIGH (ref 70–99)

## 2023-08-12 LAB — LIPASE, BLOOD: Lipase: 23 U/L (ref 11–51)

## 2023-08-12 LAB — CBC
HCT: 36.4 % (ref 36.0–46.0)
Hemoglobin: 12 g/dL (ref 12.0–15.0)
MCH: 30.2 pg (ref 26.0–34.0)
MCHC: 33 g/dL (ref 30.0–36.0)
MCV: 91.7 fL (ref 80.0–100.0)
Platelets: 191 10*3/uL (ref 150–400)
RBC: 3.97 MIL/uL (ref 3.87–5.11)
RDW: 17 % — ABNORMAL HIGH (ref 11.5–15.5)
WBC: 6.4 10*3/uL (ref 4.0–10.5)
nRBC: 0 % (ref 0.0–0.2)

## 2023-08-12 MED ORDER — FLUTICASONE FUROATE-VILANTEROL 100-25 MCG/ACT IN AEPB
1.0000 | INHALATION_SPRAY | Freq: Every day | RESPIRATORY_TRACT | Status: DC
  Administered 2023-08-12 – 2023-08-16 (×5): 1 via RESPIRATORY_TRACT
  Filled 2023-08-12 (×2): qty 28

## 2023-08-12 MED ORDER — AMIODARONE HCL 200 MG PO TABS
200.0000 mg | ORAL_TABLET | Freq: Every day | ORAL | Status: DC
Start: 2023-08-12 — End: 2023-08-16
  Administered 2023-08-12 – 2023-08-16 (×5): 200 mg via ORAL
  Filled 2023-08-12 (×5): qty 1

## 2023-08-12 MED ORDER — SODIUM CHLORIDE 0.9 % IV SOLN: Freq: Once | INTRAVENOUS | Status: AC

## 2023-08-12 MED ORDER — ONDANSETRON HCL 4 MG/2ML IJ SOLN
4.0000 mg | INTRAMUSCULAR | Status: AC
Start: 2023-08-12 — End: 2023-08-12
  Administered 2023-08-12: 4 mg via INTRAVENOUS
  Filled 2023-08-12: qty 2

## 2023-08-12 MED ORDER — APIXABAN 2.5 MG PO TABS
2.5000 mg | ORAL_TABLET | Freq: Two times a day (BID) | ORAL | Status: DC
  Administered 2023-08-12 – 2023-08-16 (×9): 2.5 mg via ORAL
  Filled 2023-08-12 (×10): qty 1

## 2023-08-12 MED ORDER — TRAZODONE HCL 100 MG PO TABS
100.0000 mg | ORAL_TABLET | Freq: Every day | ORAL | Status: DC
  Administered 2023-08-12 – 2023-08-15 (×4): 100 mg via ORAL
  Filled 2023-08-12 (×4): qty 1

## 2023-08-12 MED ORDER — PANTOPRAZOLE SODIUM 40 MG PO TBEC
40.0000 mg | DELAYED_RELEASE_TABLET | Freq: Every day | ORAL | Status: DC
  Administered 2023-08-12 – 2023-08-16 (×5): 40 mg via ORAL
  Filled 2023-08-12 (×5): qty 1

## 2023-08-12 MED ORDER — ONDANSETRON HCL 4 MG/2ML IJ SOLN
4.0000 mg | Freq: Four times a day (QID) | INTRAMUSCULAR | Status: DC | PRN
  Administered 2023-08-12 – 2023-08-15 (×2): 4 mg via INTRAVENOUS
  Filled 2023-08-12 (×2): qty 2

## 2023-08-12 MED ORDER — HYDRALAZINE HCL 50 MG PO TABS
50.0000 mg | ORAL_TABLET | Freq: Three times a day (TID) | ORAL | Status: DC
  Administered 2023-08-12 – 2023-08-16 (×11): 50 mg via ORAL
  Filled 2023-08-12 (×13): qty 1

## 2023-08-12 MED ORDER — PIPERACILLIN-TAZOBACTAM 3.375 G IVPB
3.3750 g | Freq: Three times a day (TID) | INTRAVENOUS | Status: DC
Start: 2023-08-12 — End: 2023-08-14
  Administered 2023-08-12 – 2023-08-14 (×5): 3.375 g via INTRAVENOUS
  Filled 2023-08-12 (×5): qty 50

## 2023-08-12 MED ORDER — ISOSORBIDE MONONITRATE ER 30 MG PO TB24
30.0000 mg | ORAL_TABLET | Freq: Every day | ORAL | Status: DC
  Administered 2023-08-12 – 2023-08-15 (×4): 30 mg via ORAL
  Filled 2023-08-12 (×4): qty 1

## 2023-08-12 MED ORDER — TRAMADOL HCL 50 MG PO TABS: 50.0000 mg | ORAL_TABLET | Freq: Four times a day (QID) | ORAL | Status: DC | PRN

## 2023-08-12 MED ORDER — UMECLIDINIUM BROMIDE 62.5 MCG/ACT IN AEPB
1.0000 | INHALATION_SPRAY | Freq: Every day | RESPIRATORY_TRACT | Status: DC
  Administered 2023-08-12 – 2023-08-16 (×5): 1 via RESPIRATORY_TRACT
  Filled 2023-08-12 (×2): qty 7

## 2023-08-12 MED ORDER — CARVEDILOL 6.25 MG PO TABS
6.2500 mg | ORAL_TABLET | Freq: Two times a day (BID) | ORAL | Status: DC
  Administered 2023-08-12 – 2023-08-16 (×7): 6.25 mg via ORAL
  Filled 2023-08-12 (×8): qty 1

## 2023-08-12 MED ORDER — FUROSEMIDE 40 MG PO TABS
40.0000 mg | ORAL_TABLET | Freq: Every morning | ORAL | Status: DC
  Administered 2023-08-13 – 2023-08-14 (×2): 40 mg via ORAL
  Filled 2023-08-12 (×2): qty 1

## 2023-08-12 MED ORDER — PIPERACILLIN-TAZOBACTAM 3.375 G IVPB 30 MIN
3.3750 g | Freq: Once | INTRAVENOUS | Status: AC
  Administered 2023-08-12: 3.375 g via INTRAVENOUS
  Filled 2023-08-12: qty 50

## 2023-08-12 MED ORDER — DEXTROSE-SODIUM CHLORIDE 5-0.45 % IV SOLN: INTRAVENOUS | Status: DC

## 2023-08-12 MED ORDER — EMPAGLIFLOZIN 10 MG PO TABS
10.0000 mg | ORAL_TABLET | Freq: Every day | ORAL | Status: DC
  Administered 2023-08-12 – 2023-08-15 (×4): 10 mg via ORAL
  Filled 2023-08-12 (×4): qty 1

## 2023-08-12 MED ORDER — ONDANSETRON HCL 4 MG PO TABS: 4.0000 mg | ORAL_TABLET | Freq: Four times a day (QID) | ORAL | Status: DC | PRN

## 2023-08-12 MED ORDER — ALBUTEROL SULFATE (2.5 MG/3ML) 0.083% IN NEBU
2.5000 mg | INHALATION_SOLUTION | Freq: Four times a day (QID) | RESPIRATORY_TRACT | Status: DC | PRN
  Administered 2023-08-14: 2.5 mg via RESPIRATORY_TRACT
  Filled 2023-08-12: qty 3

## 2023-08-12 NOTE — Assessment & Plan Note (Signed)
Stable from a resp standpoint  Cont home inhalers   

## 2023-08-12 NOTE — Assessment & Plan Note (Signed)
BP stable Titrate home regimen 

## 2023-08-12 NOTE — H&P (Addendum)
History and Physical    Patient: Leslie Parker PNT:[id number] DOB: 09-06-1932 DOA: 08/12/2023 DOS: the patient was seen and examined on 08/12/2023 PCP: [name], MD  Patient coming from: Home  Chief Complaint:  Chief Complaint  Patient presents with   Nausea   Weakness   HPI: Leslie Parker is a 89 y.o. female with medical history significant of chronic HFrEF, asthma, atrial fibrillation, stage III-IVCKD type 2 diabetes, hypertension presenting with ileus versus diverticulitis.  Patient noted to have been admitted January 15 through January 19 for issues including acute respiratory failure with hypoxia, acute on chronic HFrEF.  Patient noted to have been appropriately diuresed with improvement in respiratory status.  Had episodes of diarrhea while admitted with negative GI workup.  Diarrhea felt to be antibiotic associated.  Given course of symptoms had improved at the time of discharge.  Per report, patient tried to eat some food overnight with significant onset of pain recurrent vomiting and malaise.  No chest pain or shortness of breath.  No lower extremity swelling.  Abdominal pain is predominantly in the lower abdomen.  No reported black or bloody bowel movements. Review of Systems: As mentioned in the history of present illness. All other systems reviewed and are negative. Past Medical History:  Diagnosis Date   Acute kidney injury superimposed on CKD (HCC) 11/24/2022   Asthma    Atrial fibrillation with RVR (HCC)    Cancer (HCC)    Basal Cell   Closed left hip fracture (HCC) 01/25/2017   Closed right hip fracture (HCC) 02/13/2023   Diabetes mellitus without complication (HCC)    Femur fracture, left (HCC) 02/03/2015   Heart murmur    Hypertension    Impetigo    Osteoarthritis    Osteopenia    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   Rapid atrial fibrillation, new onset(HCC) 06/05/2022   Vomiting    can not due to surgery   Wears dentures    full upper and lower    Past Surgical History:  Procedure Laterality Date   ABDOMINAL HYSTERECTOMY     BACK SURGERY     BLADDER SURGERY     mesh   CARPAL TUNNEL RELEASE Bilateral    CATARACT EXTRACTION Right 2017   CATARACT EXTRACTION W/PHACO Left 02/06/2016   Procedure: CATARACT EXTRACTION PHACO AND INTRAOCULAR LENS PLACEMENT (IOC) left eye;  Surgeon: Sherald Hess, MD;  Location: [facility];  Service: Ophthalmology;  Laterality: Left;  DIABETIC LEFT Cannot arrive before 9:30   CERVICAL DISC SURGERY     CHOLECYSTECTOMY     EYE SURGERY Bilateral    Cataract Extraction with IOL   FEMUR IM NAIL Left 02/04/2015   Procedure: INTRAMEDULLARY (IM) NAIL FEMORAL;  Surgeon: Juanell Fairly, MD;  Location: [facility];  Service: Orthopedics;  Laterality: Left;   HARDWARE REMOVAL Left 01/17/2017   Procedure: HARDWARE REMOVAL;  Surgeon: Juanell Fairly, MD;  Location: [facility];  Service: Orthopedics;  Laterality: Left;   HERNIA REPAIR  2014   esophageal and gastric mesh. patient unable to throw up d/t mesh   HIP ARTHROPLASTY Left 01/26/2017   Procedure: ARTHROPLASTY BIPOLAR HIP (HEMIARTHROPLASTY) removal hardware left hip;  Surgeon: Deeann Saint, MD;  Location: [facility];  Service: Orthopedics;  Laterality: Left;   INTRAMEDULLARY (IM) NAIL INTERTROCHANTERIC Right 02/13/2023   Procedure: INTRAMEDULLARY (IM) NAIL INTERTROCHANTERIC;  Surgeon: Christena Flake, MD;  Location: [facility];  Service: Orthopedics;  Laterality: Right;   JOINT REPLACEMENT Bilateral    knees  PACEMAKER IMPLANT N/A 11/15/2022   Procedure: PACEMAKER IMPLANT;  Surgeon: Marcina Millard, MD;  Location: [facility];  Service: Cardiovascular;  Laterality: N/A;   PACEMAKER IMPLANT N/A 11/28/2022   Procedure: PACEMAKER IMPLANT;  Surgeon: Marcina Millard, MD;  Location: [facility];  Service: Cardiovascular;  Laterality: N/A;  Lead reposition   REPLACEMENT TOTAL KNEE BILATERAL Bilateral [id number]   SHOULDER ARTHROSCOPY  WITH OPEN ROTATOR CUFF REPAIR AND DISTAL CLAVICLE ACROMINECTOMY Left 10/25/2016   Procedure: SHOULDER ARTHROSCOPY WITH OPEN ROTATOR CUFF REPAIR AND DISTAL CLAVICLE ACROMINECTOMY;  Surgeon: Juanell Fairly, MD;  Location: [facility];  Service: Orthopedics;  Laterality: Left;   THYROID SURGERY     goiter removed   TOTAL HIP REVISION Left 12/02/2017   Procedure: TOTAL HIP REVISION;  Surgeon: Lyndle Herrlich, MD;  Location: [facility];  Service: Orthopedics;  Laterality: Left;   TOTAL SHOULDER REPLACEMENT Right 2012   Social History:  reports that she has never smoked. She has never used smokeless tobacco. She reports that she does not drink alcohol and does not use drugs.  Allergies  Allergen Reactions   Baclofen Other (See Comments)    Elevated Cr   Simvastatin Other (See Comments)    Pt denies  Elevated LFTs with simva 40mg , stopped and resolved (see MD note 11/10/13)    Family History  Problem Relation Age of Onset   Heart failure Mother    Hypertension Mother    Emphysema Father     Prior to Admission medications   Medication Sig Start Date End Date Taking? Authorizing Provider  albuterol (VENTOLIN HFA) 108 (90 Base) MCG/ACT inhaler Inhale 2 puffs into the lungs every 6 (six) hours as needed. 08/04/19   [provider]  amiodarone (PACERONE) 200 MG tablet Take 200 mg by mouth daily.    [provider]  apixaban (ELIQUIS) 2.5 MG TABS tablet Take 1 tablet (2.5 mg total) by mouth 2 (two) times daily. 06/07/22   Tresa Moore, MD  ascorbic acid (VITAMIN C) 250 MG CHEW Chew 250 mg by mouth daily.    [provider]  carvedilol (COREG) 6.25 MG tablet Take 1 tablet (6.25 mg total) by mouth 2 (two) times daily with a meal. 08/11/23 11/09/23  Carollee Herter, DO  Cholecalciferol (VITAMIN D-1000 MAX ST) 25 MCG (1000 UT) tablet Take 1,000 Units by mouth daily.    [provider]  colestipol (COLESTID) 1 g tablet Take 1 tablet by mouth 2 (two) times daily. 06/01/22    [provider]  Cyanocobalamin (B-12) 2500 MCG TABS Take 2,500 mcg by mouth daily.    [provider]  empagliflozin (JARDIANCE) 10 MG TABS tablet Take 1 tablet (10 mg total) by mouth daily. 08/12/23 11/10/23  Carollee Herter, DO  furosemide (LASIX) 40 MG tablet Take 1 tablet (40 mg total) by mouth in the morning. 08/11/23 11/09/23  Carollee Herter, DO  hydrALAZINE (APRESOLINE) 50 MG tablet Take 1 tablet (50 mg total) by mouth 3 (three) times daily. 08/11/23 11/09/23  Carollee Herter, DO  ipratropium-albuterol (DUONEB) 0.5-2.5 (3) MG/3ML SOLN Inhale 3 mLs into the lungs every 6 (six) hours as needed. 06/01/22   [provider]  isosorbide mononitrate (IMDUR) 30 MG 24 hr tablet Take 1 tablet (30 mg total) by mouth at bedtime. 08/11/23 11/09/23  Carollee Herter, DO  Magnesium Oxide 500 MG CAPS Take 500 mg by mouth daily.    [provider]  Multiple Vitamin (MULTIVITAMIN WITH MINERALS) TABS tablet Take 1 tablet  by mouth daily. One-A-Day Women's Vitamin    [provider]  omeprazole (PRILOSEC) 20 MG capsule Take 20 mg by mouth every morning. 01/10/23   [provider]  ondansetron (ZOFRAN-ODT) 4 MG disintegrating tablet Take 4 mg by mouth every 8 (eight) hours as needed. 05/21/23   [provider]  traMADol (ULTRAM) 50 MG tablet Take 1 tablet by mouth every 6 (six) hours as needed. 07/01/23   [provider]  traZODone (DESYREL) 100 MG tablet Take 100 mg by mouth at bedtime.    [provider]  TRELEGY ELLIPTA 100-62.5-25 MCG/ACT AEPB Inhale 1 puff into the lungs daily. 08/03/22   [provider]    Physical Exam: Vitals:   08/12/23 0865 08/12/23 0518 08/12/23 0520 08/12/23 0957  BP:  (!) 163/75    Pulse:  66    Resp:  15    Temp:  97.6 F (36.4 C)  (!) 97.5 F (36.4 C)  TempSrc:  Oral  Oral  SpO2: 94% 94%    Weight:   88.2 kg   Height:   5\' 9"  (1.753 m)    Physical Exam Constitutional:      Appearance: She is normal weight.   HENT:     Head: Normocephalic and atraumatic.     Nose: Nose normal.     Mouth/Throat:     Mouth: Mucous membranes are dry.  Eyes:     Pupils: Pupils are equal, round, and reactive to light.  Cardiovascular:     Rate and Rhythm: Normal rate and regular rhythm.  Pulmonary:     Effort: Pulmonary effort is normal.  Abdominal:     General: Bowel sounds are normal.     Comments: + lower abd TTP  + hyperactive bowel sounds    Musculoskeletal:        General: Normal range of motion.  Skin:    General: Skin is warm.  Neurological:     General: No focal deficit present.  Psychiatric:        Mood and Affect: Mood normal.     Data Reviewed:  There are no new results to review at this time.  CT ABDOMEN PELVIS WO CONTRAST CLINICAL DATA:  Nausea and weakness.  Bowel obstruction suspected.  EXAM: CT ABDOMEN AND PELVIS WITHOUT CONTRAST  TECHNIQUE: Multidetector CT imaging of the abdomen and pelvis was performed following the standard protocol without IV contrast.  RADIATION DOSE REDUCTION: This exam was performed according to the departmental dose-optimization program which includes automated exposure control, adjustment of the mA and/or kV according to patient size and/or use of iterative reconstruction technique.  COMPARISON:  02/19/2013  FINDINGS: Lower chest: Dependent atelectasis noted in both lower lobes. Moderate bilateral pleural effusions.  Hepatobiliary: No focal mass lesion identified in the liver. Gallbladder surgically absent. Intra and extrahepatic biliary duct dilatation is progressive in the interval with common bile duct measuring up to 19 mm diameter.  Pancreas: Pancreas is diffusely atrophic without main duct dilatation. 3.9 x 3.0 cm well-defined homogeneous fluid density structure along the tail of the pancreas (23/2) is new in the interval and may be an exophytic cyst from the pancreatic tail. Immediately adjacent to the splenic artery, splenic  artery aneurysm is not excluded but considered much less likely.  Spleen: 19 mm hypoattenuating lesion in the dome of the spleen cannot be definitively characterized but is likely benign.  Adrenals/Urinary Tract: No adrenal nodule or mass. Multiple cysts of varying size and attenuation are noted in both  kidneys measuring up to 9 cm on the right and 5 cm on the left. 2.3 cm lesion interpolar right kidney has attenuation too high to be a simple cyst. 2.3 cm exophytic lesion upper pole left kidney has heterogeneous attenuation. There is a 2.1 cm lesion in the interpolar left kidney with attenuation too high to be a simple cyst. Additional high density lesions are seen in both kidneys. No hydronephrosis. No evidence for hydroureter. Bladder largely obscured by beam hardening artifact from orthopedic hardware in both hips.  Stomach/Bowel: Surgical changes in the esophagogastric junction are compatible with prior fundoplication. Duodenum is normally positioned as is the ligament of Treitz. No small bowel wall thickening. No small bowel dilatation. The terminal ileum is normal. Tiny appendicolith noted with otherwise normal appendix. Right, transverse, and descending colon are diffusely distended with gas and fluid. Advanced diverticular disease is seen in the nondilated sigmoid colon. Wall thickening in the sigmoid segment the colon is likely related to the diverticulosis. Possible subtle pericolonic edema along the proximal sigmoid segment (axial 60/2 and coronal 35/5). No abrupt transition in the left colon although luminal narrowing is seen in the sigmoid segment.  Vascular/Lymphatic: There is moderate atherosclerotic calcification of the abdominal aorta without aneurysm. There is no gastrohepatic or hepatoduodenal ligament lymphadenopathy. No retroperitoneal or mesenteric lymphadenopathy. No pelvic sidewall lymphadenopathy.  Reproductive: There is no adnexal mass.  Other: Trace  free fluid is seen in the left paracolic gutter and in the right pelvis.  Musculoskeletal: Mild body wall edema evident. Fixation hardware noted proximal right femur with left hip prosthesis evident. Bones are diffusely demineralized. Diffuse degenerative disc disease noted lumbar spine with chronic compression deformity noted at the T12 level.  IMPRESSION: 1. Right, transverse, and descending colon are diffusely distended with gas and fluid. No abrupt transition in the left colon although luminal narrowing is seen in the sigmoid segment where there is advanced diverticular disease in wall thickening likely secondary to the diverticulosis. Diverticular stricture not excluded. Possible subtle pericolonic edema in the proximal sigmoid segment raising the question of diverticulitis. Imaging features are compatible with colonic ileus. 2. 3.9 x 3.0 cm well-defined homogeneous fluid density structure along the tail of the pancreas is new in the interval and may be an exophytic cyst from the pancreatic tail. Immediately adjacent to the splenic artery, splenic artery aneurysm is not excluded but considered much less likely. MRI abdomen with and without contrast recommended to further evaluate. 3. Moderate bilateral pleural effusions with dependent atelectasis in both lower lobes. 4. Multiple cysts of varying size and attenuation in both kidneys. There are a few lesions in both kidneys which have attenuation too high to be simple cysts. These could also be further evaluated at the time of abdominal MRI. 5. Progressive intra and extrahepatic biliary duct dilatation status post cholecystectomy. Correlation with liver function test recommended. 6.  Aortic Atherosclerosis (ICD10-I70.0).  Electronically Signed   By: Kennith Center M.D.   On: 08/12/2023 06:25   Lab Results  Component Value Date   WBC 6.4 08/12/2023   HGB 12.0 08/12/2023   HCT 36.4 08/12/2023   MCV 91.7 08/12/2023   PLT  191 08/12/2023   Last metabolic panel Lab Results  Component Value Date   GLUCOSE 118 (H) 08/12/2023   NA 134 (L) 08/12/2023   K 3.6 08/12/2023   CL 103 08/12/2023   CO2 17 (L) 08/12/2023   BUN 37 (H) 08/12/2023   CREATININE 1.88 (H) 08/12/2023   GFRNONAA 25 (L) 08/12/2023  CALCIUM 8.2 (L) 08/12/2023   PROT 5.5 (L) 08/12/2023   ALBUMIN 2.8 (L) 08/12/2023   BILITOT 1.1 08/12/2023   ALKPHOS 73 08/12/2023   AST 25 08/12/2023   ALT 21 08/12/2023   ANIONGAP 14 08/12/2023    Assessment and Plan: * Ileus (HCC) Suspected diverticulitis  Inability to tolerate po intake x 12-24 hours w/ noted large bowel obstruction vs. Diverticulitis  on CT today  General surgery consulted- appreciated recommendations  Clear liquid diet  IV zosyn for diverticulitis coverage  Pain control  Antiemetics  Follow closely  HFrEF (heart failure with reduced ejection fraction) (HCC) 2D ECHO 07/2022 w/ EF 25-30%  Appears euvolemic w/ noted recent admission for volume overload  Weight today 88kg  Cont home regimen including Coreg to 6.25 mg twice daily, hydralazine/isosorbide and Jardiance  Hold lasix x 24 hours as pt is mildly dry assd w/ diverticulitis vs. Ileus   Paroxysmal atrial fibrillation (HCC) Rate controlled  Cont amiodarone, eliquis, coreg   Essential hypertension BP stable  Titrate home regimen    CKD (chronic kidney disease) stage 4, GFR 15-29 ml/min (HCC) Cr 1.8 w/ GFR in 20s  Looks to be near baseline  Monitor   Large bowel obstruction (HCC)    Sick sinus syndrome (HCC) S/p PPM  Needs outpatient pacemaker interrogation    COPD (chronic obstructive pulmonary disease) (HCC) Stable from a resp standpoint  Cont home inhalers    Diabetes mellitus without complication (HCC) Blood sugar in 110s  SSI  Monitor   Asthma, chronic Stable from a resp standpoint  Cont home inhalers        Advance Care Planning:   Code Status: Full Code   Consults: General surgery    Family Communication: No family at the bedside   Severity of Illness: The appropriate patient status for this patient is INPATIENT. Inpatient status is judged to be reasonable and necessary in order to provide the required intensity of service to ensure the patient's safety. The patient's presenting symptoms, physical exam findings, and initial radiographic and laboratory data in the context of their chronic comorbidities is felt to place them at high risk for further clinical deterioration. Furthermore, it is not anticipated that the patient will be medically stable for discharge from the hospital within 2 midnights of admission.   * I certify that at the point of admission it is my clinical judgment that the patient will require inpatient hospital care spanning beyond 2 midnights from the point of admission due to high intensity of service, high risk for further deterioration and high frequency of surveillance required.*  Author: Floydene Flock, MD 08/12/2023 9:57 AM  For on call review [email or web address].

## 2023-08-12 NOTE — ED Provider Notes (Signed)
[facility] Provider Note    None    (approximate)   History   Nausea loose stool  HPI  Leslie Parker is a 89 y.o. female  HFpEF, asthma, atrial fibrillation, stage III CKD type 2 diabetes, hypertension   Was discharged yesterday after being treated for heart failure and pneumonia.  She was evaluated and had GI panel done for concerns of diarrhea yesterday and that was reportedly negative and was felt to be diarrhea secondary to short course of antibiotic.    Patient reports that she left the hospital stopped and got a sandwich on the way home yesterday.  She started feeling nauseated and had loose stool yesterday once or twice.  Does not have any trouble breathing no chest pain she reports that she feels like she is "sick on her stomach" and feels nauseated.  She reports she has no appetite and cannot eat anything today her stomach feels upset and she had loose stool several times yesterday.  She reports feeling very fatigued and weak to the point that she laid in bed all day and is not have enough energy to get up and walk or move about the house  Physical Exam   Triage Vital Signs: ED Triage Vitals  Encounter Vitals Group     BP      Systolic BP Percentile      Diastolic BP Percentile      Pulse      Resp      Temp      Temp src      SpO2      Weight      Height      Head Circumference      Peak Flow      Pain Score      Pain Loc      Pain Education      Exclude from Growth Chart     Most recent vital signs: Vitals:   08/12/23 0517 08/12/23 0518  BP:  (!) 163/75  Pulse:  66  Resp:  15  Temp:  97.6 F (36.4 C)  SpO2: 94% 94%     General: Awake, no distress.  Resting comfortably.  Alerts to voice.  No obvious acute emesis or discomfort, she is asleep when I walked up to her in the hallway bed but after speaking with her for a minute or 2 she begins to have belching and reports nauseated feeling in the mid abdomen. CV:  Good peripheral  perfusion.  Normal tones Resp:  Normal effort.  Clear with normal work of breathing no rales.  No evidence of suggest acute CHF at this time Abd:  No distention.  Soft nontender though she reports a nauseated feeling with frequent belching and reports she feels like she wants to throw up but cannot Other:    Patient advises she has been having the symptoms while she was in the hospital as well at that time they tested her stool.   ED Results / Procedures / Treatments   Labs (all labs ordered are listed, but only abnormal results are displayed) Labs Reviewed  CBC - Abnormal; Notable for the following components:      Result Value   RDW 17.0 (*)    All other components within normal limits  COMPREHENSIVE METABOLIC PANEL  LIPASE, BLOOD     EKG  And interpreted by me at 650 heart rate 60 QRS 160 QTc 520 Dual paced rhythm no evidence of frank  ischemia   RADIOLOGY  CT ABDOMEN PELVIS WO CONTRAST Result Date: 08/12/2023 CLINICAL DATA:  Nausea and weakness.  Bowel obstruction suspected. EXAM: CT ABDOMEN AND PELVIS WITHOUT CONTRAST TECHNIQUE: Multidetector CT imaging of the abdomen and pelvis was performed following the standard protocol without IV contrast. RADIATION DOSE REDUCTION: This exam was performed according to the departmental dose-optimization program which includes automated exposure control, adjustment of the mA and/or kV according to patient size and/or use of iterative reconstruction technique. COMPARISON:  02/19/2013 FINDINGS: Lower chest: Dependent atelectasis noted in both lower lobes. Moderate bilateral pleural effusions. Hepatobiliary: No focal mass lesion identified in the liver. Gallbladder surgically absent. Intra and extrahepatic biliary duct dilatation is progressive in the interval with common bile duct measuring up to 19 mm diameter. Pancreas: Pancreas is diffusely atrophic without main duct dilatation. 3.9 x 3.0 cm well-defined homogeneous fluid density structure  along the tail of the pancreas (23/2) is new in the interval and may be an exophytic cyst from the pancreatic tail. Immediately adjacent to the splenic artery, splenic artery aneurysm is not excluded but considered much less likely. Spleen: 19 mm hypoattenuating lesion in the dome of the spleen cannot be definitively characterized but is likely benign. Adrenals/Urinary Tract: No adrenal nodule or mass. Multiple cysts of varying size and attenuation are noted in both kidneys measuring up to 9 cm on the right and 5 cm on the left. 2.3 cm lesion interpolar right kidney has attenuation too high to be a simple cyst. 2.3 cm exophytic lesion upper pole left kidney has heterogeneous attenuation. There is a 2.1 cm lesion in the interpolar left kidney with attenuation too high to be a simple cyst. Additional high density lesions are seen in both kidneys. No hydronephrosis. No evidence for hydroureter. Bladder largely obscured by beam hardening artifact from orthopedic hardware in both hips. Stomach/Bowel: Surgical changes in the esophagogastric junction are compatible with prior fundoplication. Duodenum is normally positioned as is the ligament of Treitz. No small bowel wall thickening. No small bowel dilatation. The terminal ileum is normal. Tiny appendicolith noted with otherwise normal appendix. Right, transverse, and descending colon are diffusely distended with gas and fluid. Advanced diverticular disease is seen in the nondilated sigmoid colon. Wall thickening in the sigmoid segment the colon is likely related to the diverticulosis. Possible subtle pericolonic edema along the proximal sigmoid segment (axial 60/2 and coronal 35/5). No abrupt transition in the left colon although luminal narrowing is seen in the sigmoid segment. Vascular/Lymphatic: There is moderate atherosclerotic calcification of the abdominal aorta without aneurysm. There is no gastrohepatic or hepatoduodenal ligament lymphadenopathy. No  retroperitoneal or mesenteric lymphadenopathy. No pelvic sidewall lymphadenopathy. Reproductive: There is no adnexal mass. Other: Trace free fluid is seen in the left paracolic gutter and in the right pelvis. Musculoskeletal: Mild body wall edema evident. Fixation hardware noted proximal right femur with left hip prosthesis evident. Bones are diffusely demineralized. Diffuse degenerative disc disease noted lumbar spine with chronic compression deformity noted at the T12 level. IMPRESSION: 1. Right, transverse, and descending colon are diffusely distended with gas and fluid. No abrupt transition in the left colon although luminal narrowing is seen in the sigmoid segment where there is advanced diverticular disease in wall thickening likely secondary to the diverticulosis. Diverticular stricture not excluded. Possible subtle pericolonic edema in the proximal sigmoid segment raising the question of diverticulitis. Imaging features are compatible with colonic ileus. 2. 3.9 x 3.0 cm well-defined homogeneous fluid density structure along the tail of the pancreas is  new in the interval and may be an exophytic cyst from the pancreatic tail. Immediately adjacent to the splenic artery, splenic artery aneurysm is not excluded but considered much less likely. MRI abdomen with and without contrast recommended to further evaluate. 3. Moderate bilateral pleural effusions with dependent atelectasis in both lower lobes. 4. Multiple cysts of varying size and attenuation in both kidneys. There are a few lesions in both kidneys which have attenuation too high to be simple cysts. These could also be further evaluated at the time of abdominal MRI. 5. Progressive intra and extrahepatic biliary duct dilatation status post cholecystectomy. Correlation with liver function test recommended. 6.  Aortic Atherosclerosis (ICD10-I70.0). Electronically Signed   By: Kennith Center M.D.   On: 08/12/2023 06:25      History of recent renal/chronic  renal disease.  Will proceed without contrast, doubt acute vascular concern at this time and patient appears to have abdominal adipose tissue that should help demarcate any significant stranding  PROCEDURES:  Critical Care performed: No  Procedures   MEDICATIONS ORDERED IN ED: Medications  ondansetron (ZOFRAN) injection 4 mg (4 mg Intravenous Given 08/12/23 4403)     IMPRESSION / MDM / ASSESSMENT AND PLAN / ED COURSE  I reviewed the triage vital signs and the nursing notes.                              Differential diagnosis includes, but is not limited to, dyspepsia, ileus, infectious enteritis, gastritis, diverticulitis, etc.  She is awake alert no acute distress but does appear to have frequent belching and reports a nauseated persistent feeling has been associated with loose stools for about the last 4 days occurring also while in the hospital.  She had negative stool studies performed while in the hospital, if able to provide stool today we will also send for C. difficile.  Given the symptoms, considerations for intra-abdominal causes are high.  No acute cardiopulmonary symptoms.  Will treat with antiemetic care at this time.  Cautious use of any IV fluid given her recent history of volume overload and need for diuresis.  She does not appear to be acutely hypovolemic on exam today awaiting lab  Patient's presentation is most consistent with acute complicated illness / injury requiring diagnostic workup.   Ongoing care assigned to Dr. Derrill Kay  Plan to follow-up on pending labs patient will require admission to the hospital for concerns of at minimum an ileus but also additional findings including though less likely splenic artery aneurysm, diverticulitis etc.  Clinical symptoms seem to most suggestive of ileus at this time and further incidental type findings.       FINAL CLINICAL IMPRESSION(S) / ED DIAGNOSES   Final diagnoses:  Ileus (HCC)  Nauseous  Abnormal abdominal CT  scan     Rx / DC Orders   ED Discharge Orders     None        Note:  This document was prepared using Dragon voice recognition software and may include unintentional dictation errors.   Sharyn Creamer, MD 08/12/23 [phone]

## 2023-08-12 NOTE — Assessment & Plan Note (Signed)
Blood sugar in 110s  SSI  Monitor

## 2023-08-12 NOTE — Assessment & Plan Note (Signed)
2D ECHO 07/2022 w/ EF 25-30%  Appears euvolemic w/ noted recent admission for volume overload  Weight today 88kg  Cont home regimen including Coreg to 6.25 mg twice daily, hydralazine/isosorbide and Jardiance  Hold lasix x 24 hours as pt is mildly dry assd w/ diverticulitis vs. Ileus

## 2023-08-12 NOTE — Assessment & Plan Note (Addendum)
 Marland Kitchen

## 2023-08-12 NOTE — Assessment & Plan Note (Signed)
Rate controlled  Cont amiodarone, eliquis, coreg

## 2023-08-12 NOTE — Assessment & Plan Note (Signed)
S/p PPM  Needs outpatient pacemaker interrogation

## 2023-08-12 NOTE — ED Triage Notes (Signed)
Pt BIB [facility] from home. Pt was d/c'd yesterday and "got a fish sandwich on the way home and has been nauseous since, also c/o weakness. EMS VS 173/83, 71, 96%RA

## 2023-08-12 NOTE — ED Notes (Signed)
Patient placed on 2L nasal cannula due to O2 87%

## 2023-08-12 NOTE — Assessment & Plan Note (Addendum)
Suspected diverticulitis  Inability to tolerate po intake x 12-24 hours w/ noted large bowel obstruction vs. Diverticulitis  on CT today  General surgery consulted- appreciated recommendations  Clear liquid diet  IV zosyn for diverticulitis coverage  Pain control  Antiemetics  Follow closely

## 2023-08-12 NOTE — Assessment & Plan Note (Signed)
Cr 1.8 w/ GFR in 20s  Looks to be near baseline  Monitor

## 2023-08-12 NOTE — Consult Note (Signed)
Pharmacy Antibiotic Note  Leslie Parker is a 89 y.o. female admitted on 08/12/2023 with  Intra-abdominal Infection .  Patient was just discharged 1/15 - 1/19 after being admitted for CHF exacerbation.There may be mild diverticulitis with associated colonic ileus. Surgery agrees with antibiotics, no emergent surgery indicate at this time for diverticulitis. Pharmacy has been consulted for Zosyn dosing.  Today, 08/12/2023 Scr 1.88 (baseline) WBC 6.4 Afebrile/24 Currently on 2L [place]  Plan: Zosyn 3.375 g Q8H  Continue to monitor renal function and culture data Follow LOT (mild diverticulitis is 4 - 7 days)  Height: 5\' 9"  (175.3 cm) Weight: 88.2 kg (194 lb 7.1 oz) IBW/kg (Calculated) : 66.2  Temp (24hrs), Avg:97.6 F (36.4 C), Min:97.5 F (36.4 C), Max:97.6 F (36.4 C)  Recent Labs  Lab 08/07/23 1248 08/08/23 0410 08/09/23 1229 08/10/23 0400 08/11/23 0303 08/12/23 0557  WBC 4.9 4.6  --   --   --  6.4  CREATININE 2.07* 1.84* 1.98* 1.95* 1.82* 1.88*  LATICACIDVEN 1.1  --   --   --   --   --     Estimated Creatinine Clearance: 24.5 mL/min (A) (by C-G formula based on SCr of 1.88 mg/dL (H)).    Allergies  Allergen Reactions   Baclofen Other (See Comments)    Elevated Cr   Simvastatin Other (See Comments)    Pt denies  Elevated LFTs with simva 40mg , stopped and resolved (see MD note 11/10/13)   Antimicrobials this admission: 1/20 Zosyn >>   Dose adjustments this admission: NA  Microbiology results: No cultures  Thank you for allowing pharmacy to be a part of this patient's care.  Effie Shy, PharmD Pharmacy Resident  08/12/2023 5:46 PM

## 2023-08-12 NOTE — Consult Note (Signed)
[facility] SURGICAL CONSULTATION NOTE (initial) - cpt: [id number]   HISTORY OF PRESENT ILLNESS (HPI):  89 y.o. female presented to [facility] ED today for evaluation of nausea. Patient admitted from 01/15 - 01/19 secondary to CHF exacerbation. She reports that on her way home from the hospital she got a fish sandwich and had a few bites before becoming nauseous. She denied any abdominal pain, fever, chills, cough, CP, SOB. She reports diarrhea while in the hospital but no flatus or BM in last 24 hours. Abdominal surgical history positive for paraesophageal hernia repair with fundoplication, cholecystectomy. She has has a history of HCC and follows with [facility]. Last colonoscopy of record in 2014 noted removal of three sessile polyps and diverticulosis of the descending colon. She is also on Eliquis. Work up in the ED revealed a normal WBC of 6.4K, Hgb to 12.0, renal function baseline with sCr - 1.8 (Hx of CKD III), hyponatremia to 134. She did have CT Abdomen/Pelvis which was concerning for dilation of the colon with diverticulosis and possible inflammation and narrowing of the distal colon, no abrupt obstruction evident. She was admitted to the medicine service.   Surgery is consulted by emergency medicine physician Dr. Olga Coaster, MD in this context for evaluation and management of colonic ileus vs diverticulitis.   PAST MEDICAL HISTORY (PMH):  Past Medical History:  Diagnosis Date   Acute kidney injury superimposed on CKD (HCC) 11/24/2022   Asthma    Atrial fibrillation with RVR (HCC)    Cancer (HCC)    Basal Cell   Closed left hip fracture (HCC) 01/25/2017   Closed right hip fracture (HCC) 02/13/2023   Diabetes mellitus without complication (HCC)    Femur fracture, left (HCC) 02/03/2015   Heart murmur    Hypertension    Impetigo    Osteoarthritis    Osteopenia    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   Rapid atrial fibrillation, new onset(HCC) 06/05/2022    Vomiting    can not due to surgery   Wears dentures    full upper and lower     PAST SURGICAL HISTORY (PSH):  Past Surgical History:  Procedure Laterality Date   ABDOMINAL HYSTERECTOMY     BACK SURGERY     BLADDER SURGERY     mesh   CARPAL TUNNEL RELEASE Bilateral    CATARACT EXTRACTION Right 2017   CATARACT EXTRACTION W/PHACO Left 02/06/2016   Procedure: CATARACT EXTRACTION PHACO AND INTRAOCULAR LENS PLACEMENT (IOC) left eye;  Surgeon: Sherald Hess, MD;  Location: [facility];  Service: Ophthalmology;  Laterality: Left;  DIABETIC LEFT Cannot arrive before 9:30   CERVICAL DISC SURGERY     CHOLECYSTECTOMY     EYE SURGERY Bilateral    Cataract Extraction with IOL   FEMUR IM NAIL Left 02/04/2015   Procedure: INTRAMEDULLARY (IM) NAIL FEMORAL;  Surgeon: Juanell Fairly, MD;  Location: [facility];  Service: Orthopedics;  Laterality: Left;   HARDWARE REMOVAL Left 01/17/2017   Procedure: HARDWARE REMOVAL;  Surgeon: Juanell Fairly, MD;  Location: [facility];  Service: Orthopedics;  Laterality: Left;   HERNIA REPAIR  2014   esophageal and gastric mesh. patient unable to throw up d/t mesh   HIP ARTHROPLASTY Left 01/26/2017   Procedure: ARTHROPLASTY BIPOLAR HIP (HEMIARTHROPLASTY) removal hardware left hip;  Surgeon: Deeann Saint, MD;  Location: [facility];  Service: Orthopedics;  Laterality: Left;   INTRAMEDULLARY (IM) NAIL INTERTROCHANTERIC Right 02/13/2023   Procedure: INTRAMEDULLARY (IM) NAIL INTERTROCHANTERIC;  Surgeon: Leron Croak  J, MD;  Location: [facility];  Service: Orthopedics;  Laterality: Right;   JOINT REPLACEMENT Bilateral    knees   PACEMAKER IMPLANT N/A 11/15/2022   Procedure: PACEMAKER IMPLANT;  Surgeon: Marcina Millard, MD;  Location: [facility];  Service: Cardiovascular;  Laterality: N/A;   PACEMAKER IMPLANT N/A 11/28/2022   Procedure: PACEMAKER IMPLANT;  Surgeon: Marcina Millard, MD;  Location: [facility];  Service: Cardiovascular;   Laterality: N/A;  Lead reposition   REPLACEMENT TOTAL KNEE BILATERAL Bilateral [id number]   SHOULDER ARTHROSCOPY WITH OPEN ROTATOR CUFF REPAIR AND DISTAL CLAVICLE ACROMINECTOMY Left 10/25/2016   Procedure: SHOULDER ARTHROSCOPY WITH OPEN ROTATOR CUFF REPAIR AND DISTAL CLAVICLE ACROMINECTOMY;  Surgeon: Juanell Fairly, MD;  Location: [facility];  Service: Orthopedics;  Laterality: Left;   THYROID SURGERY     goiter removed   TOTAL HIP REVISION Left 12/02/2017   Procedure: TOTAL HIP REVISION;  Surgeon: Lyndle Herrlich, MD;  Location: [facility];  Service: Orthopedics;  Laterality: Left;   TOTAL SHOULDER REPLACEMENT Right 2012     MEDICATIONS:  Prior to Admission medications   Medication Sig Start Date End Date Taking? Authorizing Provider  albuterol (VENTOLIN HFA) 108 (90 Base) MCG/ACT inhaler Inhale 2 puffs into the lungs every 6 (six) hours as needed. 08/04/19   [provider]  amiodarone (PACERONE) 200 MG tablet Take 200 mg by mouth daily.    [provider]  apixaban (ELIQUIS) 2.5 MG TABS tablet Take 1 tablet (2.5 mg total) by mouth 2 (two) times daily. 06/07/22   Tresa Moore, MD  ascorbic acid (VITAMIN C) 250 MG CHEW Chew 250 mg by mouth daily.    [provider]  carvedilol (COREG) 6.25 MG tablet Take 1 tablet (6.25 mg total) by mouth 2 (two) times daily with a meal. 08/11/23 11/09/23  Carollee Herter, DO  Cholecalciferol (VITAMIN D-1000 MAX ST) 25 MCG (1000 UT) tablet Take 1,000 Units by mouth daily.    [provider]  colestipol (COLESTID) 1 g tablet Take 1 tablet by mouth 2 (two) times daily. 06/01/22   [provider]  Cyanocobalamin (B-12) 2500 MCG TABS Take 2,500 mcg by mouth daily.    [provider]  empagliflozin (JARDIANCE) 10 MG TABS tablet Take 1 tablet (10 mg total) by mouth daily. 08/12/23 11/10/23  Carollee Herter, DO  furosemide (LASIX) 40 MG tablet Take 1 tablet (40 mg total) by mouth in the morning. 08/11/23 11/09/23  Carollee Herter, DO   hydrALAZINE (APRESOLINE) 50 MG tablet Take 1 tablet (50 mg total) by mouth 3 (three) times daily. 08/11/23 11/09/23  Carollee Herter, DO  ipratropium-albuterol (DUONEB) 0.5-2.5 (3) MG/3ML SOLN Inhale 3 mLs into the lungs every 6 (six) hours as needed. 06/01/22   [provider]  isosorbide mononitrate (IMDUR) 30 MG 24 hr tablet Take 1 tablet (30 mg total) by mouth at bedtime. 08/11/23 11/09/23  Carollee Herter, DO  Magnesium Oxide 500 MG CAPS Take 500 mg by mouth daily.    [provider]  Multiple Vitamin (MULTIVITAMIN WITH MINERALS) TABS tablet Take 1 tablet by mouth daily. One-A-Day Women's Vitamin    [provider]  omeprazole (PRILOSEC) 20 MG capsule Take 20 mg by mouth every morning. 01/10/23   [provider]  ondansetron (ZOFRAN-ODT) 4 MG disintegrating tablet Take 4 mg by mouth every 8 (eight) hours as needed. 05/21/23   [provider]  traMADol (ULTRAM) 50 MG tablet Take 1 tablet by mouth every 6 (six) hours as  needed. 07/01/23   [provider]  traZODone (DESYREL) 100 MG tablet Take 100 mg by mouth at bedtime.    [provider]  TRELEGY ELLIPTA 100-62.5-25 MCG/ACT AEPB Inhale 1 puff into the lungs daily. 08/03/22   [provider]     ALLERGIES:  Allergies  Allergen Reactions   Baclofen Other (See Comments)    Elevated Cr   Simvastatin Other (See Comments)    Pt denies  Elevated LFTs with simva 40mg , stopped and resolved (see MD note 11/10/13)     SOCIAL HISTORY:  Social History   Socioeconomic History   Marital status: Divorced    Spouse name: Not on file   Number of children: 4   Years of education: Not on file   Highest education level: 12th grade  Occupational History   Occupation: Retitred/Disability  Tobacco Use   Smoking status: Never   Smokeless tobacco: Never  Vaping Use   Vaping status: Never Used  Substance and Sexual Activity   Alcohol use: No    Alcohol/week: 0.0 standard drinks of alcohol    Drug use: No   Sexual activity: Never  Other Topics Concern   Not on file  Social History Narrative   Not on file   Social Drivers of Health   Financial Resource Strain: Low Risk  (06/06/2023)   Overall Financial Resource Strain (CARDIA)    Difficulty of Paying Living Expenses: Not hard at all  Food Insecurity: No Food Insecurity (08/09/2023)   Hunger Vital Sign    Worried About Running Out of Food in the Last Year: Never true    Ran Out of Food in the Last Year: Never true  Transportation Needs: No Transportation Needs (08/09/2023)   PRAPARE - Administrator, Civil Service (Medical): No    Lack of Transportation (Non-Medical): No  Physical Activity: Not on file  Stress: Not on file  Social Connections: Moderately Integrated (08/09/2023)   Social Connection and Isolation Panel [NHANES]    Frequency of Communication with Friends and Family: Twice a week    Frequency of Social Gatherings with Friends and Family: Three times a week    Attends Religious Services: 1 to 4 times per year    Active Member of Clubs or Organizations: No    Attends Banker Meetings: 1 to 4 times per year    Marital Status: Divorced  Catering manager Violence: Not At Risk (08/09/2023)   Humiliation, Afraid, Rape, and Kick questionnaire    Fear of Current or Ex-Partner: No    Emotionally Abused: No    Physically Abused: No    Sexually Abused: No     FAMILY HISTORY:  Family History  Problem Relation Age of Onset   Heart failure Mother    Hypertension Mother    Emphysema Father       REVIEW OF SYSTEMS:  Review of Systems  Constitutional:  Negative for chills and fever.  Respiratory:  Negative for cough and shortness of breath.   Cardiovascular:  Negative for chest pain and palpitations.  Gastrointestinal:  Positive for diarrhea and nausea. Negative for abdominal pain, blood in stool, constipation and vomiting.  Genitourinary:  Negative for dysuria and urgency.  All other  systems reviewed and are negative.   VITAL SIGNS:  Temp:  [97.6 F (36.4 C)-99.1 F (37.3 C)] 97.6 F (36.4 C) (01/20 0518) Pulse Rate:  [66-75] 66 (01/20 0518) Resp:  [15-18] 15 (01/20 0518) BP: (155-186)/(75-88) 163/75 (01/20 0518) SpO2:  [  94 %-95 %] 94 % (01/20 0518) Weight:  [88.2 kg] 88.2 kg (01/20 0520)     Height: 5\' 9"  (175.3 cm) Weight: 88.2 kg BMI (Calculated): 28.7   INTAKE/OUTPUT:  No intake/output data recorded.  PHYSICAL EXAM:  Physical Exam Vitals and nursing note reviewed. Exam conducted with a chaperone present.  Constitutional:      General: She is not in acute distress.    Appearance: Normal appearance. She is not ill-appearing.     Comments: Resting in bed; NAD  HENT:     Head: Normocephalic and atraumatic.  Eyes:     Conjunctiva/sclera: Conjunctivae normal.     Pupils: Pupils are equal, round, and reactive to light.  Cardiovascular:     Rate and Rhythm: Normal rate.     Pulses: Normal pulses.  Pulmonary:     Effort: Pulmonary effort is normal. No respiratory distress.  Abdominal:     General: There is no distension.     Palpations: Abdomen is soft.     Tenderness: There is no abdominal tenderness. There is no guarding or rebound.     Comments: Abdomen is soft, non-tender, non-distended, tympanic in upper abdomen, no rebound/guarding. She is certainly without peritonitis   Genitourinary:    Comments: Deferred Skin:    General: Skin is warm and dry.     Coloration: Skin is not jaundiced.  Neurological:     General: No focal deficit present.     Mental Status: She is alert and oriented to person, place, and time.  Psychiatric:        Mood and Affect: Mood normal.        Behavior: Behavior normal.      Labs:     Latest Ref Rng & Units 08/12/2023    5:57 AM 08/08/2023    4:10 AM 08/07/2023   12:48 PM  CBC  WBC 4.0 - 10.5 K/uL 6.4  4.6  4.9   Hemoglobin 12.0 - 15.0 g/dL 11.9  14.7  82.9   Hematocrit 36.0 - 46.0 % 36.4  35.8  38.0   Platelets  150 - 400 K/uL 191  182  204       Latest Ref Rng & Units 08/12/2023    5:57 AM 08/11/2023    3:03 AM 08/10/2023    4:00 AM  CMP  Glucose 70 - 99 mg/dL 562  87  130   BUN 8 - 23 mg/dL 37  35  37   Creatinine 0.44 - 1.00 mg/dL 8.65  7.84  6.96   Sodium 135 - 145 mmol/L 134  134  136   Potassium 3.5 - 5.1 mmol/L 3.6  3.9  3.3   Chloride 98 - 111 mmol/L 103  102  103   CO2 22 - 32 mmol/L 17  22  24    Calcium 8.9 - 10.3 mg/dL 8.2  7.7  7.5   Total Protein 6.5 - 8.1 g/dL 5.5  4.7  4.4   Total Bilirubin 0.0 - 1.2 mg/dL 1.1  0.9  0.8   Alkaline Phos 38 - 126 U/L 73  62  55   AST 15 - 41 U/L 25  18  18    ALT 0 - 44 U/L 21  18  22       Imaging studies:   CT Abdomen/Pelvis (08/11/2022) personally reviewed with distension of the colon and distal diverticulosis, question of some associated inflammation, no abrupt evidence of obstruction, no free air, and radiologist report reviewed below: IMPRESSION: 1. Right,  transverse, and descending colon are diffusely distended with gas and fluid. No abrupt transition in the left colon although luminal narrowing is seen in the sigmoid segment where there is advanced diverticular disease in wall thickening likely secondary to the diverticulosis. Diverticular stricture not excluded. Possible subtle pericolonic edema in the proximal sigmoid segment raising the question of diverticulitis. Imaging features are compatible with colonic ileus. 2. 3.9 x 3.0 cm well-defined homogeneous fluid density structure along the tail of the pancreas is new in the interval and may be an exophytic cyst from the pancreatic tail. Immediately adjacent to the splenic artery, splenic artery aneurysm is not excluded but considered much less likely. MRI abdomen with and without contrast recommended to further evaluate. 3. Moderate bilateral pleural effusions with dependent atelectasis in both lower lobes. 4. Multiple cysts of varying size and attenuation in both kidneys. There  are a few lesions in both kidneys which have attenuation too high to be simple cysts. These could also be further evaluated at the time of abdominal MRI. 5. Progressive intra and extrahepatic biliary duct dilatation status post cholecystectomy. Correlation with liver function test recommended. 6.  Aortic Atherosclerosis (ICD10-I70.0).   Assessment/Plan:  89 y.o. female with nausea found to have possible colonic ileus +/- mild diverticulitis, complicated by recent admission for CHF, advanced age, need for anticoagulation   - From a surgical perspective, her abdominal examination is benign, she is without leukocytosis, and afebrile. Noted CT findings. There may be mild diverticulitis with associated colonic ileus. May also have some degree of luminal narrowing secondary to this. No evidence of complete obstruction. For now, I think we can manage this conservatively.   - Appreciate medicine admission - I think CLD is reasonable - Agree with Abx - No need for emergent surgical intervention - She will likely benefit from repeat colonoscopy to ensure no stricture/stenosis. This will certainly need to be once she is recovered from this insult. Could consider BE as well to evaluate for obstruction should condition worsen although this is not definitive to rule out mass potentially.    - Monitor abdominal examination; on-going bowel function - Pain control prn; antiemetics prn - Mobilize as tolerated - Further management per primary service; we will follow    All of the above findings and recommendations were discussed with the patient, and all of patient's questions were answered to her expressed satisfaction.  Thank you for the opportunity to participate in this patient's care.   -- Lynden Oxford, PA-C [facility] 08/12/2023, 8:23 AM M-F: 7am - 4pm

## 2023-08-12 NOTE — ED Notes (Signed)
 CCMD called to monitor patient.

## 2023-08-13 DIAGNOSIS — K567 Ileus, unspecified: Secondary | ICD-10-CM | POA: Diagnosis not present

## 2023-08-13 DIAGNOSIS — K5792 Diverticulitis of intestine, part unspecified, without perforation or abscess without bleeding: Secondary | ICD-10-CM | POA: Diagnosis not present

## 2023-08-13 LAB — COMPREHENSIVE METABOLIC PANEL
ALT: 22 U/L (ref 0–44)
AST: 23 U/L (ref 15–41)
Albumin: 2.3 g/dL — ABNORMAL LOW (ref 3.5–5.0)
Alkaline Phosphatase: 57 U/L (ref 38–126)
Anion gap: 9 (ref 5–15)
BUN: 35 mg/dL — ABNORMAL HIGH (ref 8–23)
CO2: 22 mmol/L (ref 22–32)
Calcium: 7.6 mg/dL — ABNORMAL LOW (ref 8.9–10.3)
Chloride: 100 mmol/L (ref 98–111)
Creatinine, Ser: 2.07 mg/dL — ABNORMAL HIGH (ref 0.44–1.00)
GFR, Estimated: 23 mL/min — ABNORMAL LOW (ref >60)
Glucose, Bld: 137 mg/dL — ABNORMAL HIGH (ref 70–99)
Potassium: 3.1 mmol/L — ABNORMAL LOW (ref 3.5–5.1)
Sodium: 131 mmol/L — ABNORMAL LOW (ref 135–145)
Total Bilirubin: 1 mg/dL (ref 0.0–1.2)
Total Protein: 4.6 g/dL — ABNORMAL LOW (ref 6.5–8.1)

## 2023-08-13 LAB — CBC
HCT: 30.9 % — ABNORMAL LOW (ref 36.0–46.0)
Hemoglobin: 10.4 g/dL — ABNORMAL LOW (ref 12.0–15.0)
MCH: 30.6 pg (ref 26.0–34.0)
MCHC: 33.7 g/dL (ref 30.0–36.0)
MCV: 90.9 fL (ref 80.0–100.0)
Platelets: 173 10*3/uL (ref 150–400)
RBC: 3.4 MIL/uL — ABNORMAL LOW (ref 3.87–5.11)
RDW: 17.2 % — ABNORMAL HIGH (ref 11.5–15.5)
WBC: 4.3 10*3/uL (ref 4.0–10.5)
nRBC: 0 % (ref 0.0–0.2)

## 2023-08-13 LAB — MAGNESIUM: Magnesium: 2.1 mg/dL (ref 1.7–2.4)

## 2023-08-13 MED ORDER — POTASSIUM CHLORIDE CRYS ER 20 MEQ PO TBCR
40.0000 meq | EXTENDED_RELEASE_TABLET | Freq: Two times a day (BID) | ORAL | Status: AC
  Administered 2023-08-13 (×2): 40 meq via ORAL
  Filled 2023-08-13 (×2): qty 2

## 2023-08-13 NOTE — Progress Notes (Signed)
Progress Note   Patient: Leslie Parker JXB:[id number] DOB: [birth year] DOA: 08/12/2023     1 DOS: the patient was seen and examined on 08/13/2023   Brief hospital course:   Leslie Parker is a 89 y.o. female with medical history significant of chronic HFrEF, asthma, atrial fibrillation, stage III-IVCKD type 2 diabetes, hypertension presenting with ileus versus diverticulitis.  Patient noted to have been admitted January 15 through January 19 for issues including acute respiratory failure with hypoxia, acute on chronic HFrEF.  Patient noted to have been appropriately diuresed with improvement in respiratory status.  Had episodes of diarrhea while admitted with negative GI workup.  Diarrhea felt to be antibiotic associated.  Given course of symptoms had improved at the time of discharge.  Per report, patient tried to eat some food overnight with significant onset of pain recurrent vomiting and malaise.  No chest pain or shortness of breath.  No lower extremity swelling.  Abdominal pain is predominantly in the lower abdomen.  No reported black or bloody bowel movements.   Assessment and Plan:  * Ileus (HCC) Suspected diverticulitis  Inability to tolerate po intake x 12-24 hours  Imaging shows  right, transverse, and descending colon are diffusely distended with gas and fluid. No abrupt transition in the left colon although luminal narrowing is seen in the sigmoid segment where there is advanced diverticular disease in wall thickening likely secondary to the diverticulosis. Diverticular stricture not excluded. Possible subtle pericolonic edema in the proximal sigmoid segment raising the question of diverticulitis. Imaging features are compatible with colonic ileus. Patient able to pass flatus but has not had a bowel movement Appreciate General surgery input Continue clear liquid diet  Continue IV zosyn for diverticulitis  Continue as needed pain control and antiemetics     HFrEF (heart failure with  reduced ejection fraction) (HCC) 2D ECHO 07/2022 w/ EF 25-30%  Appears euvolemic w/ noted recent admission for volume overload  Weight today 88kg  Cont home regimen including Coreg to 6.25 mg twice daily, hydralazine/isosorbide and Jardiance as much as blood pressure tolerates Continue furosemide     Paroxysmal atrial fibrillation (HCC) Rate controlled  Cont amiodarone, eliquis, coreg     Essential hypertension Monitor blood pressure closely        Sick sinus syndrome (HCC) S/p PPM  Needs outpatient pacemaker interrogation      COPD (chronic obstructive pulmonary disease) (HCC) Stable from a resp standpoint  Cont home inhalers      Diabetes mellitus with stage IV chronic kidney disease (HCC) Continue clear liquid Glycemic control with sliding scale insulin Noted to have a bump in her serum creatinine Monitor closely    Hypokalemia Secondary to GI losses and diuretic therapy Supplement potassium          Subjective: Patient is seen and examined at the bedside.  Improved abdominal pain.  Denies having any nausea  Physical Exam: Vitals:   08/12/23 2000 08/12/23 2047 08/13/23 0416 08/13/23 0911  BP: (!) 132/56 (!) 117/55 (!) 104/59 (!) 109/57  Pulse: (!) 58 (!) 59 60 65  Resp:  20 20 18   Temp:  98.3 F (36.8 C) 98.2 F (36.8 C) 98.6 F (37 C)  TempSrc:  Oral Oral Oral  SpO2: 92% 97% 91% 90%  Weight:      Height:       Constitutional:      Appearance: She is normal weight.  HENT:     Head: Normocephalic and atraumatic.  Nose: Nose normal.     Mouth/Throat:     Mouth: Mucous membranes are dry.  Eyes:     Pupils: Pupils are equal, round, and reactive to light.  Cardiovascular:     Rate and Rhythm: Normal rate and regular rhythm.  Pulmonary:     Effort: Pulmonary effort is normal.  Abdominal:     General: Bowel sounds are normal.     Comments: Soft, nontender, nondistended Musculoskeletal:        General: Normal range of motion.  Skin:     General: Skin is warm.  Neurological:     General: No focal deficit present.  Psychiatric:        Mood and Affect: Mood normal.   Data Reviewed: Labs reviewed.  Potassium 3.1 There are no new results to review at this time.  Family Communication: Plan of care discussed with patient in detail.  She verbalizes understanding and agrees with the plan  Disposition: Status is: Inpatient Remains inpatient appropriate because: On IV antibiotics  Planned Discharge Destination: Home with Home Health    Time spent: 35  minutes  Author: Lucile Shutters, MD 08/13/2023 1:23 PM  For on call review [email or web address].

## 2023-08-13 NOTE — Progress Notes (Signed)
[facility] SURGICAL PROGRESS NOTE (cpt [phone])  Hospital Day(s): 1.   Interval History: Patient seen and examined, no acute events or new complaints overnight. Patient reports she is doing better. Does note a "sick feeling" for a few minutes when she first gets up but denied abdominal pain, nausea, nor emesis. No fever, chills. She remains without leukocytosis; WBC 4.3K. Hgb to 10.4. Bump in sCr to 2.07; UO - unmeasured x1. Hyponatremia to 131. Hypokalemia to 3.1. She has been on CLD. No reports of flatus nor stools. Continues on Zosyn  Review of Systems:  Constitutional: denies fever, chills  HEENT: denies cough or congestion  Respiratory: denies any shortness of breath  Cardiovascular: denies chest pain or palpitations  Gastrointestinal: denies abdominal pain, N/V, Genitourinary: denies burning with urination or urinary frequency Musculoskeletal: denies pain, decreased motor or sensation  Vital signs in last 24 hours: [min-max] current  Temp:  [97.5 F (36.4 C)-98.3 F (36.8 C)] 98.2 F (36.8 C) (01/21 0416) Pulse Rate:  [58-64] 60 (01/21 0416) Resp:  [16-20] 20 (01/21 0416) BP: (104-150)/(55-73) 104/59 (01/21 0416) SpO2:  [91 %-100 %] 91 % (01/21 0416)     Height: 5\' 9"  (175.3 cm) Weight: 88.2 kg BMI (Calculated): 28.7   Intake/Output last 2 shifts:  01/20 0701 - 01/21 0700 In: 527.8 [I.V.:527.8] Out: -    Physical Exam:  Constitutional: alert, cooperative and no distress  HENT: normocephalic without obvious abnormality  Eyes: PERRL, EOM's grossly intact and symmetric  Respiratory: breathing non-labored at rest  Cardiovascular: regular rate and sinus rhythm  Gastrointestinal: soft, non-tender, and non-distended. No rebound/guarding  Musculoskeletal: no edema or wounds, motor and sensation grossly intact, NT    Labs:     Latest Ref Rng & Units 08/13/2023    5:40 AM 08/12/2023    5:57 AM 08/08/2023    4:10 AM  CBC  WBC 4.0 - 10.5 K/uL 4.3  6.4  4.6    Hemoglobin 12.0 - 15.0 g/dL 02.5  42.7  06.2   Hematocrit 36.0 - 46.0 % 30.9  36.4  35.8   Platelets 150 - 400 K/uL 173  191  182       Latest Ref Rng & Units 08/13/2023    5:40 AM 08/12/2023    5:57 AM 08/11/2023    3:03 AM  CMP  Glucose 70 - 99 mg/dL 376  283  87   BUN 8 - 23 mg/dL 35  37  35   Creatinine 0.44 - 1.00 mg/dL 1.51  7.61  6.07   Sodium 135 - 145 mmol/L 131  134  134   Potassium 3.5 - 5.1 mmol/L 3.1  3.6  3.9   Chloride 98 - 111 mmol/L 100  103  102   CO2 22 - 32 mmol/L 22  17  22    Calcium 8.9 - 10.3 mg/dL 7.6  8.2  7.7   Total Protein 6.5 - 8.1 g/dL 4.6  5.5  4.7   Total Bilirubin 0.0 - 1.2 mg/dL 1.0  1.1  0.9   Alkaline Phos 38 - 126 U/L 57  73  62   AST 15 - 41 U/L 23  25  18    ALT 0 - 44 U/L 22  21  18      Imaging studies: No new pertinent imaging studies   Assessment/Plan:  89 y.o. female with found to have possible colonic ileus +/- mild diverticulitis, complicated by recent admission for CHF, advanced age, need for anticoagulation               -  Would not advance beyond CLD until flatus or bowel function return - Continue Abx (Zosyn) - No need for emergent surgical intervention. She understands if she were to clinically deteriorate at any time, we would need to consider this - She will likely benefit from repeat colonoscopy to ensure no stricture/stenosis. This will certainly need to be once she is recovered from this insult. Could consider BE as well to evaluate for colonic obstruction should condition worsen although this is not definitive to rule out mass potentially.               - Monitor abdominal examination; on-going bowel function - Pain control prn; antiemetics prn - Mobilize as tolerated - Further management per primary service; we will follow    All of the above findings and recommendations were discussed with the patient, and the medical team, and all of patient's questions were answered to her expressed satisfaction.  -- Lynden Oxford,  PA-C [facility] 08/13/2023, 7:14 AM M-F: 7am - 4pm

## 2023-08-13 NOTE — Consult Note (Signed)
[facility] Liaison Consult Note    08/13/2023  Leslie Parker 07/13/1933 [id number]  Primary Care Provider:  [name], MD [facility])  Patient is currently active with Care Management for chronic disease management services.  Patient has been engaged by a Presenter, broadcasting.  Our community based plan of care has focused on disease management and community resource support.   Patient will receive a post hospital call and will be evaluated for assessments and disease process education.   Plan: Pending discharge disposition.  Inpatient Transition Of Care [TOC] team member to make aware that Care Management following.  Of note, Care Management services does not replace or interfere with any services that are needed or arranged by inpatient [facility] care management team.   For additional questions or referrals please contact:  Elliot Cousin, RN, [facility] Liaison [place]   [facility], Population [facility] Office Hours MTWF  8:00 am-6:00 pm Direct Dial: [phone] mobile [phone] [Office toll free line] Office Hours are M-F 8:30 - 5 pm [email or web address]

## 2023-08-13 NOTE — Plan of Care (Signed)

## 2023-08-13 NOTE — Progress Notes (Signed)
BP 109/57, heart rate 65, map 70.Order received from Dr Joylene Igo to hold both hydralazine 50mg  and carvedilol 6.25mg 

## 2023-08-14 DIAGNOSIS — K5792 Diverticulitis of intestine, part unspecified, without perforation or abscess without bleeding: Secondary | ICD-10-CM | POA: Diagnosis not present

## 2023-08-14 DIAGNOSIS — K567 Ileus, unspecified: Secondary | ICD-10-CM | POA: Diagnosis not present

## 2023-08-14 LAB — BASIC METABOLIC PANEL
Anion gap: 9 (ref 5–15)
BUN: 36 mg/dL — ABNORMAL HIGH (ref 8–23)
CO2: 22 mmol/L (ref 22–32)
Calcium: 7.8 mg/dL — ABNORMAL LOW (ref 8.9–10.3)
Chloride: 100 mmol/L (ref 98–111)
Creatinine, Ser: 2.6 mg/dL — ABNORMAL HIGH (ref 0.44–1.00)
GFR, Estimated: 17 mL/min — ABNORMAL LOW (ref >60)
Glucose, Bld: 115 mg/dL — ABNORMAL HIGH (ref 70–99)
Potassium: 4.3 mmol/L (ref 3.5–5.1)
Sodium: 131 mmol/L — ABNORMAL LOW (ref 135–145)

## 2023-08-14 MED ORDER — PIPERACILLIN-TAZOBACTAM IN DEX 2-0.25 GM/50ML IV SOLN
2.2500 g | Freq: Three times a day (TID) | INTRAVENOUS | Status: DC
  Administered 2023-08-14 – 2023-08-15 (×4): 2.25 g via INTRAVENOUS
  Filled 2023-08-14 (×5): qty 50

## 2023-08-14 MED ORDER — GUAIFENESIN-DM 100-10 MG/5ML PO SYRP
5.0000 mL | ORAL_SOLUTION | ORAL | Status: DC | PRN
  Administered 2023-08-14: 5 mL via ORAL
  Filled 2023-08-14: qty 10

## 2023-08-14 NOTE — Progress Notes (Signed)
PROGRESS NOTE    Leslie Parker  ZOX:[id number] DOB: December 23, 1932 DOA: 08/12/2023 PCP: [name], MD   Assessment & Plan:   Principal Problem:   Ileus [facility]) Active Problems:   Paroxysmal atrial fibrillation (HCC)   HFrEF (heart failure with reduced ejection fraction) (HCC)   Essential hypertension   Asthma, chronic   Diabetes mellitus without complication (HCC)   COPD (chronic obstructive pulmonary disease) (HCC)   Sick sinus syndrome (HCC)   Large bowel obstruction (HCC)   CKD (chronic kidney disease) stage 4, GFR 15-29 ml/min (HCC)  Assessment and Plan Likely diverticulitis: continue on IV zosyn. Having bowel movements, unlikely ileus    Chronic systolic CHF: echo 07/2022 w/ EF 25-30%. Continue on coreg. Will hold lasix tomorrow AM. Monitor I/Os   PAF: continue on amio, coreg, eliquis   HTN: continue on coreg, imdur   Sick sinus syndrome: s/p PPM.    COPD: w/o exacerbation. Bronchodilators prn    DM2: far control, HbA1c 7.0. Continue on jardiance   CKDIV: Cr is trending up from day prior. Holding lasix tomorrow AM. Avoid nephrotoxic meds   Hypokalemia: WNL today          DVT prophylaxis: eliquis  Code Status: full  Family Communication: Disposition Plan: likely d/c back home   Level of care: Telemetry Medical  Status is: Inpatient Remains inpatient appropriate because: severity of illness    Consultants:  Gen surg   Procedures:   Antimicrobials: zosyn    Subjective: Pt c/o abd pain  Objective: Vitals:   08/13/23 1736 08/13/23 1945 08/13/23 2235 08/14/23 0802  BP: 110/71 (!) 97/57 (!) 110/58 114/67  Pulse: 60 61 61 60  Resp: 16 16 16 18   Temp: 98.1 F (36.7 C) 97.8 F (36.6 C)  98 F (36.7 C)  TempSrc: Oral Oral  Oral  SpO2: 95% 94% 100% 100%  Weight:      Height:        Intake/Output Summary (Last 24 hours) at 08/14/2023 0902 Last data filed at 08/13/2023 1744 Gross per 24 hour  Intake 1285.41 ml  Output --  Net 1285.41 ml    Filed Weights   08/12/23 0520  Weight: 88.2 kg    Examination:  General exam: Appears calm and comfortable  Respiratory system: Clear to auscultation. Respiratory effort normal. Cardiovascular system: S1 & S2 +. No rubs, gallops or clicks.  Gastrointestinal system: Abdomen is nondistended, soft and tenderness to palpation. Normal bowel sounds heard. Central nervous system: Alert and oriented. Moves all extremities  Psychiatry: Judgement and insight appear normal. Flat mood and affect    Data Reviewed: I have personally reviewed following labs and imaging studies  CBC: Recent Labs  Lab 08/07/23 1248 08/08/23 0410 08/12/23 0557 08/13/23 0540  WBC 4.9 4.6 6.4 4.3  NEUTROABS 3.7  --   --   --   HGB 12.6 11.0* 12.0 10.4*  HCT 38.0 35.8* 36.4 30.9*  MCV 91.6 96.5 91.7 90.9  PLT 204 182 191 173   Basic Metabolic Panel: Recent Labs  Lab 08/10/23 0400 08/11/23 0303 08/12/23 0557 08/13/23 0540 08/14/23 0629  NA 136 134* 134* 131* 131*  K 3.3* 3.9 3.6 3.1* 4.3  CL 103 102 103 100 100  CO2 24 22 17* 22 22  GLUCOSE 110* 87 118* 137* 115*  BUN 37* 35* 37* 35* 36*  CREATININE 1.95* 1.82* 1.88* 2.07* 2.60*  CALCIUM 7.5* 7.7* 8.2* 7.6* 7.8*  MG 1.4* 1.6*  --  2.1  --  GFR: Estimated Creatinine Clearance: 17.7 mL/min (A) (by C-G formula based on SCr of 2.6 mg/dL (H)). Liver Function Tests: Recent Labs  Lab 08/09/23 1229 08/10/23 0400 08/11/23 0303 08/12/23 0557 08/13/23 0540  AST 21 18 18 25 23   ALT 26 22 18 21 22   ALKPHOS 68 55 62 73 57  BILITOT 0.9 0.8 0.9 1.1 1.0  PROT 5.3* 4.4* 4.7* 5.5* 4.6*  ALBUMIN 2.8* 2.3* 2.5* 2.8* 2.3*   Recent Labs  Lab 08/12/23 0557  LIPASE 23   No results for input(s): "AMMONIA" in the last 168 hours. Coagulation Profile: Recent Labs  Lab 08/07/23 1248  INR 1.1   Cardiac Enzymes: No results for input(s): "CKTOTAL", "CKMB", "CKMBINDEX", "TROPONINI" in the last 168 hours. BNP (last 3 results) No results for input(s):  "PROBNP" in the last 8760 hours. HbA1C: No results for input(s): "HGBA1C" in the last 72 hours. CBG: Recent Labs  Lab 08/10/23 1220 08/10/23 1612 08/10/23 2138 08/11/23 0840 08/12/23 2109  GLUCAP 121* 119* 90 101* 101*   Lipid Profile: No results for input(s): "CHOL", "HDL", "LDLCALC", "TRIG", "CHOLHDL", "LDLDIRECT" in the last 72 hours. Thyroid Function Tests: No results for input(s): "TSH", "T4TOTAL", "FREET4", "T3FREE", "THYROIDAB" in the last 72 hours. Anemia Panel: No results for input(s): "VITAMINB12", "FOLATE", "FERRITIN", "TIBC", "IRON", "RETICCTPCT" in the last 72 hours. Sepsis Labs: Recent Labs  Lab 08/07/23 1248 08/08/23 0903  PROCALCITON  --  <0.10  LATICACIDVEN 1.1  --     Recent Results (from the past 240 hours)  Culture, blood (Routine x 2)     Status: None   Collection Time: 08/07/23 12:48 PM   Specimen: BLOOD  Result Value Ref Range Status   Specimen Description   Final    BLOOD BLOOD RIGHT ARM Performed at [facility], [address] [place], [place] [address]    Special Requests   Final    BOTTLES DRAWN AEROBIC ONLY Blood Culture results may not be optimal due to an inadequate volume of blood received in culture bottles Performed at [facility], [address], [place], [place] [address]    Culture   Final    NO GROWTH 5 DAYS Performed at [facility], [address] [place], [place] [address]    Report Status 08/12/2023 FINAL  Final  Resp panel by RT-PCR (RSV, Flu A&B, Covid) Anterior Nasal Swab     Status: None   Collection Time: 08/07/23 12:48 PM   Specimen: Anterior Nasal Swab  Result Value Ref Range Status   SARS Coronavirus 2 by RT PCR NEGATIVE NEGATIVE Final    Comment: (NOTE) SARS-CoV-2 target nucleic acids are NOT DETECTED.  The SARS-CoV-2 RNA is generally detectable in upper respiratory specimens during the acute phase of infection. The lowest concentration of SARS-CoV-2 viral copies this assay can  detect is 138 copies/mL. A negative result does not preclude SARS-Cov-2 infection and should not be used as the sole basis for treatment or other patient management decisions. A negative result may occur with  improper specimen collection/handling, submission of specimen other than nasopharyngeal swab, presence of viral mutation(s) within the areas targeted by this assay, and inadequate number of viral copies(<138 copies/mL). A negative result must be combined with clinical observations, patient history, and epidemiological information. The expected result is Negative.  Fact Sheet for Patients:  BloggerCourse.com  Fact Sheet for Healthcare Providers:  SeriousBroker.it  This test is no t yet approved or cleared by the [place] FDA and  has been  authorized for detection and/or diagnosis of SARS-CoV-2 by FDA under an Emergency Use Authorization (EUA). This EUA will remain  in effect (meaning this test can be used) for the duration of the COVID-19 declaration under Section 564(b)(1) of the Act, 21 U.S.C.section 360bbb-3(b)(1), unless the authorization is terminated  or revoked sooner.       Influenza A by PCR NEGATIVE NEGATIVE Final   Influenza B by PCR NEGATIVE NEGATIVE Final    Comment: (NOTE) The Xpert Xpress SARS-CoV-2/FLU/RSV plus assay is intended as an aid in the diagnosis of influenza from Nasopharyngeal swab specimens and should not be used as a sole basis for treatment. Nasal washings and aspirates are unacceptable for Xpert Xpress SARS-CoV-2/FLU/RSV testing.  Fact Sheet for Patients: BloggerCourse.com  Fact Sheet for Healthcare Providers: SeriousBroker.it  This test is not yet approved or cleared by the [place] FDA and has been authorized for detection and/or diagnosis of SARS-CoV-2 by FDA under an Emergency Use Authorization (EUA). This EUA will remain in  effect (meaning this test can be used) for the duration of the COVID-19 declaration under Section 564(b)(1) of the Act, 21 U.S.C. section 360bbb-3(b)(1), unless the authorization is terminated or revoked.     Resp Syncytial Virus by PCR NEGATIVE NEGATIVE Final    Comment: (NOTE) Fact Sheet for Patients: BloggerCourse.com  Fact Sheet for Healthcare Providers: SeriousBroker.it  This test is not yet approved or cleared by the [place] FDA and has been authorized for detection and/or diagnosis of SARS-CoV-2 by FDA under an Emergency Use Authorization (EUA). This EUA will remain in effect (meaning this test can be used) for the duration of the COVID-19 declaration under Section 564(b)(1) of the Act, 21 U.S.C. section 360bbb-3(b)(1), unless the authorization is terminated or revoked.  Performed at [facility], [address] [place], [place] [address]   Culture, blood (Routine x 2)     Status: None   Collection Time: 08/07/23  1:17 PM   Specimen: BLOOD  Result Value Ref Range Status   Specimen Description   Final    BLOOD BLOOD LEFT ARM Performed at [facility], [address], [place], [place] [address]    Special Requests   Final    BOTTLES DRAWN AEROBIC AND ANAEROBIC Blood Culture results may not be optimal due to an inadequate volume of blood received in culture bottles Performed at [facility], [address] [place], [place] [address]    Culture   Final    NO GROWTH 5 DAYS Performed at [facility], [address] [place], [place] [address]    Report Status 08/12/2023 FINAL  Final  Gastrointestinal Panel by PCR , Stool     Status: None   Collection Time: 08/10/23 10:40 AM   Specimen: Stool  Result Value Ref Range Status   Campylobacter species NOT DETECTED NOT DETECTED Final   Plesimonas shigelloides NOT DETECTED NOT DETECTED Final   Salmonella species NOT DETECTED NOT  DETECTED Final   Yersinia enterocolitica NOT DETECTED NOT DETECTED Final   Vibrio species NOT DETECTED NOT DETECTED Final   Vibrio cholerae NOT DETECTED NOT DETECTED Final   Enteroaggregative E coli (EAEC) NOT DETECTED NOT DETECTED Final   Enteropathogenic E coli (EPEC) NOT DETECTED NOT DETECTED Final   Enterotoxigenic E coli (ETEC) NOT DETECTED NOT DETECTED Final   Shiga like toxin producing E coli (STEC) NOT DETECTED NOT DETECTED Final   Shigella/Enteroinvasive E coli (EIEC) NOT DETECTED NOT DETECTED Final   Cryptosporidium NOT  DETECTED NOT DETECTED Final   Cyclospora cayetanensis NOT DETECTED NOT DETECTED Final   Entamoeba histolytica NOT DETECTED NOT DETECTED Final   Giardia lamblia NOT DETECTED NOT DETECTED Final   Adenovirus F40/41 NOT DETECTED NOT DETECTED Final   Astrovirus NOT DETECTED NOT DETECTED Final   Norovirus GI/GII NOT DETECTED NOT DETECTED Final   Rotavirus A NOT DETECTED NOT DETECTED Final   Sapovirus (I, II, IV, and V) NOT DETECTED NOT DETECTED Final    Comment: Performed at [facility], [address] [place], [place] [address]         Radiology Studies: No results found.      Scheduled Meds:  amiodarone  200 mg Oral Daily   apixaban  2.5 mg Oral BID   carvedilol  6.25 mg Oral BID WC   empagliflozin  10 mg Oral Daily   fluticasone furoate-vilanterol  1 puff Inhalation Daily   And   umeclidinium bromide  1 puff Inhalation Daily   furosemide  40 mg Oral q AM   hydrALAZINE  50 mg Oral TID   isosorbide mononitrate  30 mg Oral QHS   pantoprazole  40 mg Oral Daily   traZODone  100 mg Oral QHS   Continuous Infusions:  piperacillin-tazobactam (ZOSYN)  IV       LOS: 2 days      Charise Killian, MD [facility] Pager [phone]  If 7PM-7AM, please contact night-coverage [email or web address] 08/14/2023, 9:02 AM

## 2023-08-14 NOTE — Care Management Important Message (Signed)
Important Message  Patient Details  Name: Leslie Parker MRN: [id number] Date of Birth: 03-16-1933   Important Message Given:  Yes - Medicare IM     Sherilyn Banker 08/14/2023, 1:06 PM

## 2023-08-14 NOTE — Consult Note (Signed)
Pharmacy Antibiotic Note  Leslie Parker is a 89 y.o. female admitted on 08/12/2023 with  Intra-abdominal Infection .  Patient was just discharged 1/15 - 1/19 after being admitted for CHF exacerbation.There may be mild diverticulitis with associated colonic ileus. Surgery agrees with antibiotics, no emergent surgery indicate at this time for diverticulitis. Pharmacy has been consulted for Zosyn dosing.  Today, 08/14/2023 Scr 1.88>2.6 WBC 6.4>4.3 Afebrile/24 Currently on RA  Plan: Given change in renal function, decrease Zosyn to 2.25 g Q8H  Continue to monitor renal function and culture data Follow LOT (mild diverticulitis is 4 - 7 days)  Height: 5\' 9"  (175.3 cm) Weight: 88.2 kg (194 lb 7.1 oz) IBW/kg (Calculated) : 66.2  Temp (24hrs), Avg:98.1 F (36.7 C), Min:97.7 F (36.5 C), Max:98.6 F (37 C)  Recent Labs  Lab 08/07/23 1248 08/08/23 0410 08/09/23 1229 08/10/23 0400 08/11/23 0303 08/12/23 0557 08/13/23 0540 08/14/23 0629  WBC 4.9 4.6  --   --   --  6.4 4.3  --   CREATININE 2.07* 1.84*   < > 1.95* 1.82* 1.88* 2.07* 2.60*  LATICACIDVEN 1.1  --   --   --   --   --   --   --    < > = values in this interval not displayed.    Estimated Creatinine Clearance: 17.7 mL/min (A) (by C-G formula based on SCr of 2.6 mg/dL (H)).    Allergies  Allergen Reactions   Baclofen Other (See Comments)    Elevated Cr   Simvastatin Other (See Comments)    Pt denies  Elevated LFTs with simva 40mg , stopped and resolved (see MD note 11/10/13)   Antimicrobials this admission: 1/20 Zosyn >>   Dose adjustments this admission: NA  Microbiology results: 1/15 Bcx NGTD  Thank you for allowing pharmacy to be a part of this patient's care.  Merryl Hacker, PharmD Clinical Pharmacist  08/14/2023 7:56 AM

## 2023-08-14 NOTE — Progress Notes (Signed)
CC: Abdominal pain, nausea, diverticular Subjective: Patient reports feeling better today.  She denies any nausea or abdominal pain.  She tolerated a clear liquid diet without any nausea or emesis.  She is passing flatus and had a bowel movement  Objective: Vital signs in last 24 hours: Temp:  [97.7 F (36.5 C)-98.6 F (37 C)] 97.7 F (36.5 C) (01/22 0237) Pulse Rate:  [60-65] 63 (01/22 0237) Resp:  [16-18] 17 (01/22 0237) BP: (97-110)/(57-71) 109/62 (01/22 0237) SpO2:  [90 %-100 %] 93 % (01/22 0237) Last BM Date : 08/12/23  Intake/Output from previous day: 01/21 0701 - 01/22 0700 In: 1285.4 [P.O.:240; I.V.:907.7; IV Piggyback:137.8] Out: -  Intake/Output this shift: No intake/output data recorded.  Physical exam:  Abdomen soft, nontender nondistended  Lab Results: CBC  Recent Labs    08/12/23 0557 08/13/23 0540  WBC 6.4 4.3  HGB 12.0 10.4*  HCT 36.4 30.9*  PLT 191 173   BMET Recent Labs    08/13/23 0540 08/14/23 0629  NA 131* 131*  K 3.1* 4.3  CL 100 100  CO2 22 22  GLUCOSE 137* 115*  BUN 35* 36*  CREATININE 2.07* 2.60*  CALCIUM 7.6* 7.8*   PT/INR No results for input(s): "LABPROT", "INR" in the last 72 hours. ABG No results for input(s): "PHART", "HCO3" in the last 72 hours.  Invalid input(s): "PCO2", "PO2"  Studies/Results: No results found.  Anti-infectives: Anti-infectives (From admission, onward)    Start     Dose/Rate Route Frequency Ordered Stop   08/12/23 1800  piperacillin-tazobactam (ZOSYN) IVPB 3.375 g        3.375 g 12.5 mL/hr over 240 Minutes Intravenous Every 8 hours 08/12/23 1107     08/12/23 0930  piperacillin-tazobactam (ZOSYN) IVPB 3.375 g        3.375 g 100 mL/hr over 30 Minutes Intravenous  Once 08/12/23 0920 08/12/23 1028       Assessment/Plan:  Patient with history of heart failure who is seen in the hospital last week for this that readmitted secondary to possible diverticulitis.  She was started on antibiotics and  kept NPO.  She is now not having any pain and having bowel function.  I have advanced her to a soft diet this morning.  She can likely be discharged this afternoon.  Recommend keeping on an abx for a total of 7 days  Baker Pierini, M.D. [facility]

## 2023-08-15 DIAGNOSIS — K5792 Diverticulitis of intestine, part unspecified, without perforation or abscess without bleeding: Secondary | ICD-10-CM | POA: Diagnosis not present

## 2023-08-15 DIAGNOSIS — K567 Ileus, unspecified: Secondary | ICD-10-CM | POA: Diagnosis not present

## 2023-08-15 LAB — BASIC METABOLIC PANEL
Anion gap: 10 (ref 5–15)
BUN: 39 mg/dL — ABNORMAL HIGH (ref 8–23)
CO2: 22 mmol/L (ref 22–32)
Calcium: 8 mg/dL — ABNORMAL LOW (ref 8.9–10.3)
Chloride: 100 mmol/L (ref 98–111)
Creatinine, Ser: 2.92 mg/dL — ABNORMAL HIGH (ref 0.44–1.00)
GFR, Estimated: 15 mL/min — ABNORMAL LOW (ref >60)
Glucose, Bld: 110 mg/dL — ABNORMAL HIGH (ref 70–99)
Potassium: 4.1 mmol/L (ref 3.5–5.1)
Sodium: 132 mmol/L — ABNORMAL LOW (ref 135–145)

## 2023-08-15 LAB — CBC
HCT: 32.9 % — ABNORMAL LOW (ref 36.0–46.0)
Hemoglobin: 11 g/dL — ABNORMAL LOW (ref 12.0–15.0)
MCH: 30.6 pg (ref 26.0–34.0)
MCHC: 33.4 g/dL (ref 30.0–36.0)
MCV: 91.4 fL (ref 80.0–100.0)
Platelets: 175 10*3/uL (ref 150–400)
RBC: 3.6 MIL/uL — ABNORMAL LOW (ref 3.87–5.11)
RDW: 17.5 % — ABNORMAL HIGH (ref 11.5–15.5)
WBC: 5.1 10*3/uL (ref 4.0–10.5)
nRBC: 0 % (ref 0.0–0.2)

## 2023-08-15 MED ORDER — AMOXICILLIN-POT CLAVULANATE 875-125 MG PO TABS: 1.0000 | ORAL_TABLET | Freq: Two times a day (BID) | ORAL | Status: DC

## 2023-08-15 MED ORDER — AMOXICILLIN-POT CLAVULANATE 500-125 MG PO TABS
1.0000 | ORAL_TABLET | Freq: Two times a day (BID) | ORAL | Status: DC
  Administered 2023-08-15 – 2023-08-16 (×3): 1 via ORAL
  Filled 2023-08-15 (×4): qty 1

## 2023-08-15 MED ORDER — AMOXICILLIN-POT CLAVULANATE 500-125 MG PO TABS: 1.0000 | ORAL_TABLET | Freq: Three times a day (TID) | ORAL | Status: DC

## 2023-08-15 MED ORDER — GUAIFENESIN ER 600 MG PO TB12
600.0000 mg | ORAL_TABLET | Freq: Two times a day (BID) | ORAL | Status: DC
  Administered 2023-08-15 – 2023-08-16 (×3): 600 mg via ORAL
  Filled 2023-08-15 (×4): qty 1

## 2023-08-15 MED ORDER — SODIUM CHLORIDE 0.9 % IV SOLN
INTRAVENOUS | Status: DC
Start: 2023-08-15 — End: 2023-08-16

## 2023-08-15 NOTE — Evaluation (Signed)
Occupational Therapy Evaluation Patient Details Name: Leslie Parker MRN: [id number] DOB: June 17, 1933 Today's Date: 08/15/2023   History of Present Illness Pt is a 89 y.o. female admitted with suspected diverticulitis. PMH significant for HfrEF, AFIB, HTN, CKD Stage 4, sick sinus syndrome s/p pacemaker, COPD, DM & asthma. Recent hospitalization 1/15-1/19 for acute respiratory failure with diarrhea episodes.   Clinical Impression   Prior to hospital admission, pt was mod independent using 4WW. Pt lives alone in senior living apartment with family assisting PRN with IADLs. Pt performing bed mobility with CGA-minA to scoot hips forward with use of bed pad, transfers with CGA, and completes functional mobility in room with RW + CGA. Will require PRN MIN A for LB dressing (reports use of sock aide baseline). Small blood spots noted when performing pericare after BM (MD in room and notified, RN notified as well).  Pt would benefit from skilled OT services to address noted impairments and functional limitations (see below for any additional details) in order to maximize safety and independence while minimizing falls risk and caregiver burden. Anticipate the need for follow up [facility] OT services upon acute hospital DC.        If plan is discharge home, recommend the following: A little help with walking and/or transfers;A little help with bathing/dressing/bathroom;Assistance with cooking/housework;Assist for transportation    Functional Status Assessment  Patient has had a recent decline in their functional status and demonstrates the ability to make significant improvements in function in a reasonable and predictable amount of time.  Equipment Recommendations  None recommended by OT       Precautions / Restrictions Precautions Precautions: Fall Restrictions Weight Bearing Restrictions Per Provider Order: No      Mobility Bed Mobility Overal bed mobility: Needs Assistance Bed Mobility: Supine to  Sit     Supine to sit: Contact guard, Min assist (minA to scoot hips forward)     General bed mobility comments: increased time, HOB elevated, minA to scoot hips forward with use of bed pad    Transfers Overall transfer level: Needs assistance Equipment used: Rolling walker (2 wheels) Transfers: Sit to/from Stand, Bed to chair/wheelchair/BSC Sit to Stand: Contact guard assist           General transfer comment: Multiple transfers completed throughout session, sit<>stand from EOB with RW, cues for hand placement, SPT recliner <> BSC CGA      Balance Overall balance assessment: Needs assistance Sitting-balance support: No upper extremity supported, Feet supported Sitting balance-[name]: Good     Standing balance support: Bilateral upper extremity supported, Reliant on assistive device for balance Standing balance-[name]: Good                             ADL either performed or assessed with clinical judgement   ADL Overall ADL's : Needs assistance/impaired Eating/Feeding: Modified independent;Sitting   Grooming: Brushing hair;Sitting Grooming Details (indicate cue type and reason): sitting EOB             Lower Body Dressing: Maximal assistance;Sit to/from stand;Sitting/lateral leans Lower Body Dressing Details (indicate cue type and reason): sock aide baseline Toilet Transfer: Contact guard assist;BSC/3in1;Ambulation;Rolling walker (2 wheels)   Toileting- Clothing Manipulation and Hygiene: Maximal assistance;Sit to/from stand;Sitting/lateral lean       Functional mobility during ADLs: Contact guard assist;Rolling walker (2 wheels) General ADL Comments: Pt currently requiring PRN MIN A for LB ADL tasks, CGA for toilet transfers using RW.  Pertinent Vitals/Pain Pain Assessment Pain Assessment: No/denies pain     Extremity/Trunk Assessment Upper Extremity Assessment Upper Extremity Assessment: Generalized weakness   Lower  Extremity Assessment Lower Extremity Assessment: Defer to PT evaluation       Communication Communication Communication: No apparent difficulties   Cognition Arousal: Alert Behavior During Therapy: WFL for tasks assessed/performed Overall Cognitive Status: Within Functional Limits for tasks assessed                                 General Comments: mild confusion noted with conversation                Home Living Family/patient expects to be discharged to:: Private residence Living Arrangements: Alone Available Help at Discharge: Family;Available PRN/intermittently Type of Home: Apartment Home Access: Level entry     Home Layout: One level     Bathroom Shower/Tub: Producer, television/film/video: Handicapped height Bathroom Accessibility: Yes How Accessible: Accessible via walker Home Equipment: Rollator (4 wheels);Cane - single point;Grab bars - tub/shower;Grab bars - toilet;Lift chair;Transport chair;Tub bench;Rolling Walker (2 wheels);Shower seat   Additional Comments: Senior living apartment      Prior Functioning/Environment Prior Level of Function : Needs assist;History of Falls (last six months)             Mobility Comments: 1 fall in July; rollator at baseline, family assists with leaving home using transport w/c ADLs Comments: Seated shower, mod indep with dressing, indep with meals, meds. Family assists with groceries, driving, trash, and mail        OT Problem List: Decreased strength;Decreased activity tolerance;Decreased knowledge of use of DME or AE;Impaired balance (sitting and/or standing);Cardiopulmonary status limiting activity;Increased edema      OT Treatment/Interventions: Self-care/ADL training;Therapeutic exercise;Therapeutic activities;DME and/or AE instruction;Energy conservation;Patient/family education;Balance training    OT Goals(Current goals can be found in the care plan section) Acute Rehab OT Goals OT Goal  Formulation: With patient Time For Goal Achievement: 08/29/23 Potential to Achieve Goals: Good  OT Frequency: Min 1X/week    Co-evaluation PT/OT/SLP Co-Evaluation/Treatment: Yes Reason for Co-Treatment: For patient/therapist safety;To address functional/ADL transfers PT goals addressed during session: Mobility/safety with mobility;Balance OT goals addressed during session: ADL's and self-care      AM-PAC OT "6 Clicks" Daily Activity     Outcome Measure Help from another person eating meals?: None Help from another person taking care of personal grooming?: None Help from another person toileting, which includes using toliet, bedpan, or urinal?: A Little Help from another person bathing (including washing, rinsing, drying)?: A Little Help from another person to put on and taking off regular upper body clothing?: None Help from another person to put on and taking off regular lower body clothing?: A Little 6 Click Score: 21   End of Session Equipment Utilized During Treatment: Rolling walker (2 wheels);Gait belt Nurse Communication: Mobility status;Other (comment) (blood noticed after BM)  Activity Tolerance: Patient tolerated treatment well Patient left: in chair;with call bell/phone within reach;with chair alarm set;Other (comment) (MD in room)  OT Visit Diagnosis: Other abnormalities of gait and mobility (R26.89);Muscle weakness (generalized) (M62.81)                Time: 4098-1191 OT Time Calculation (min): 28 min Charges:  OT General Charges $OT Visit: 1 Visit OT Evaluation $OT Eval Low Complexity: 1 Low Keefer Soulliere L. Caleel Kiner, OTR/L  08/15/23, 11:46 AM

## 2023-08-15 NOTE — Progress Notes (Signed)
PROGRESS NOTE    Leslie Parker  EXB:[id number] DOB: Oct 21, 1932 DOA: 08/12/2023 PCP: [name], MD   Assessment & Plan:   Principal Problem:   Ileus [facility]) Active Problems:   Paroxysmal atrial fibrillation (HCC)   HFrEF (heart failure with reduced ejection fraction) (HCC)   Essential hypertension   Asthma, chronic   Diabetes mellitus without complication (HCC)   COPD (chronic obstructive pulmonary disease) (HCC)   Sick sinus syndrome (HCC)   Large bowel obstruction (HCC)   CKD (chronic kidney disease) stage 4, GFR 15-29 ml/min (HCC)  Assessment and Plan Likely diverticulitis: d/c IV zosyn as Cr is trending up and start augmentin. Having bowel movements, unlikely ileus    Chronic systolic CHF: echo 07/2022 w/ EF 25-30%. Continue on coreg. Holding lasix. Monitor I/Os   PAF: continue on amio, eliquis, coreg   HTN: continue on imdur, coreg    Sick sinus syndrome: s/p PPM.    COPD: w/o exacerbation. Bronchodilators prn     DM2: far control, HbA1c 7.0. Holding jardiance as Cr is trending up    CKDIV: Cr is trending up again. Started on IVFs. Avoid nephrotoxic meds    Hypokalemia: WNL today          DVT prophylaxis: eliquis  Code Status: full  Family Communication: Disposition Plan: likely d/c back home   Level of care: Telemetry Medical  Status is: Inpatient Remains inpatient appropriate because: severity of illness    Consultants:  Gen surg   Procedures:   Antimicrobials: augmentin     Subjective: Pt c/o malaise  Objective: Vitals:   08/14/23 1435 08/14/23 1736 08/14/23 1931 08/15/23 0249  BP: (!) 103/59 120/61 121/80 124/75  Pulse: 62 61 61 63  Resp: 18 17 18 16   Temp: 97.7 F (36.5 C) 98.4 F (36.9 C) 97.9 F (36.6 C) 97.6 F (36.4 C)  TempSrc: Oral Oral Oral Oral  SpO2: 95% 95% 94% 91%  Weight:      Height:       No intake or output data in the 24 hours ending 08/15/23 0918  Filed Weights   08/12/23 0520  Weight: 88.2 kg     Examination:  General exam: appears comfortable  Respiratory system: clear breath sounds b/l  Cardiovascular system: S1/S2+. No rubs or clicks  Gastrointestinal system: abd is soft, NT, ND & normal bowel sounds  Central nervous system: alert & oriented. Moves all extremities  Psychiatry: judgement and insight appears at baseline. Appropriate mood and affect    Data Reviewed: I have personally reviewed following labs and imaging studies  CBC: Recent Labs  Lab 08/12/23 0557 08/13/23 0540  WBC 6.4 4.3  HGB 12.0 10.4*  HCT 36.4 30.9*  MCV 91.7 90.9  PLT 191 173   Basic Metabolic Panel: Recent Labs  Lab 08/10/23 0400 08/11/23 0303 08/12/23 0557 08/13/23 0540 08/14/23 0629  NA 136 134* 134* 131* 131*  K 3.3* 3.9 3.6 3.1* 4.3  CL 103 102 103 100 100  CO2 24 22 17* 22 22  GLUCOSE 110* 87 118* 137* 115*  BUN 37* 35* 37* 35* 36*  CREATININE 1.95* 1.82* 1.88* 2.07* 2.60*  CALCIUM 7.5* 7.7* 8.2* 7.6* 7.8*  MG 1.4* 1.6*  --  2.1  --    GFR: Estimated Creatinine Clearance: 17.7 mL/min (A) (by C-G formula based on SCr of 2.6 mg/dL (H)). Liver Function Tests: Recent Labs  Lab 08/09/23 1229 08/10/23 0400 08/11/23 0303 08/12/23 0557 08/13/23 0540  AST 21 18 18 25  23  ALT 26 22 18 21 22   ALKPHOS 68 55 62 73 57  BILITOT 0.9 0.8 0.9 1.1 1.0  PROT 5.3* 4.4* 4.7* 5.5* 4.6*  ALBUMIN 2.8* 2.3* 2.5* 2.8* 2.3*   Recent Labs  Lab 08/12/23 0557  LIPASE 23   No results for input(s): "AMMONIA" in the last 168 hours. Coagulation Profile: No results for input(s): "INR", "PROTIME" in the last 168 hours.  Cardiac Enzymes: No results for input(s): "CKTOTAL", "CKMB", "CKMBINDEX", "TROPONINI" in the last 168 hours. BNP (last 3 results) No results for input(s): "PROBNP" in the last 8760 hours. HbA1C: No results for input(s): "HGBA1C" in the last 72 hours. CBG: Recent Labs  Lab 08/10/23 1220 08/10/23 1612 08/10/23 2138 08/11/23 0840 08/12/23 2109  GLUCAP 121* 119* 90  101* 101*   Lipid Profile: No results for input(s): "CHOL", "HDL", "LDLCALC", "TRIG", "CHOLHDL", "LDLDIRECT" in the last 72 hours. Thyroid Function Tests: No results for input(s): "TSH", "T4TOTAL", "FREET4", "T3FREE", "THYROIDAB" in the last 72 hours. Anemia Panel: No results for input(s): "VITAMINB12", "FOLATE", "FERRITIN", "TIBC", "IRON", "RETICCTPCT" in the last 72 hours. Sepsis Labs: No results for input(s): "PROCALCITON", "LATICACIDVEN" in the last 168 hours.   Recent Results (from the past 240 hours)  Culture, blood (Routine x 2)     Status: None   Collection Time: 08/07/23 12:48 PM   Specimen: BLOOD  Result Value Ref Range Status   Specimen Description   Final    BLOOD BLOOD RIGHT ARM Performed at [facility], [address], [place], [place] [address]    Special Requests   Final    BOTTLES DRAWN AEROBIC ONLY Blood Culture results may not be optimal due to an inadequate volume of blood received in culture bottles Performed at [facility], [address], [place], [place] [address]    Culture   Final    NO GROWTH 5 DAYS Performed at [facility], [address] [place], [place] [address]    Report Status 08/12/2023 FINAL  Final  Resp panel by RT-PCR (RSV, Flu A&B, Covid) Anterior Nasal Swab     Status: None   Collection Time: 08/07/23 12:48 PM   Specimen: Anterior Nasal Swab  Result Value Ref Range Status   SARS Coronavirus 2 by RT PCR NEGATIVE NEGATIVE Final    Comment: (NOTE) SARS-CoV-2 target nucleic acids are NOT DETECTED.  The SARS-CoV-2 RNA is generally detectable in upper respiratory specimens during the acute phase of infection. The lowest concentration of SARS-CoV-2 viral copies this assay can detect is 138 copies/mL. A negative result does not preclude SARS-Cov-2 infection and should not be used as the sole basis for treatment or other patient management decisions. A negative result may occur with  improper specimen  collection/handling, submission of specimen other than nasopharyngeal swab, presence of viral mutation(s) within the areas targeted by this assay, and inadequate number of viral copies(<138 copies/mL). A negative result must be combined with clinical observations, patient history, and epidemiological information. The expected result is Negative.  Fact Sheet for Patients:  BloggerCourse.com  Fact Sheet for Healthcare Providers:  SeriousBroker.it  This test is no t yet approved or cleared by the [place] FDA and  has been authorized for detection and/or diagnosis of SARS-CoV-2 by FDA under an Emergency Use Authorization (EUA). This EUA will remain  in effect (meaning this test can be used) for the duration of the COVID-19 declaration under Section 564(b)(1) of the Act, 21 U.S.C.section 360bbb-3(b)(1), unless the authorization is terminated  or  revoked sooner.       Influenza A by PCR NEGATIVE NEGATIVE Final   Influenza B by PCR NEGATIVE NEGATIVE Final    Comment: (NOTE) The Xpert Xpress SARS-CoV-2/FLU/RSV plus assay is intended as an aid in the diagnosis of influenza from Nasopharyngeal swab specimens and should not be used as a sole basis for treatment. Nasal washings and aspirates are unacceptable for Xpert Xpress SARS-CoV-2/FLU/RSV testing.  Fact Sheet for Patients: BloggerCourse.com  Fact Sheet for Healthcare Providers: SeriousBroker.it  This test is not yet approved or cleared by the [place] FDA and has been authorized for detection and/or diagnosis of SARS-CoV-2 by FDA under an Emergency Use Authorization (EUA). This EUA will remain in effect (meaning this test can be used) for the duration of the COVID-19 declaration under Section 564(b)(1) of the Act, 21 U.S.C. section 360bbb-3(b)(1), unless the authorization is terminated or revoked.     Resp Syncytial  Virus by PCR NEGATIVE NEGATIVE Final    Comment: (NOTE) Fact Sheet for Patients: BloggerCourse.com  Fact Sheet for Healthcare Providers: SeriousBroker.it  This test is not yet approved or cleared by the [place] FDA and has been authorized for detection and/or diagnosis of SARS-CoV-2 by FDA under an Emergency Use Authorization (EUA). This EUA will remain in effect (meaning this test can be used) for the duration of the COVID-19 declaration under Section 564(b)(1) of the Act, 21 U.S.C. section 360bbb-3(b)(1), unless the authorization is terminated or revoked.  Performed at [facility], [address] [place], [place] [address]   Culture, blood (Routine x 2)     Status: None   Collection Time: 08/07/23  1:17 PM   Specimen: BLOOD  Result Value Ref Range Status   Specimen Description   Final    BLOOD BLOOD LEFT ARM Performed at [facility], [address], [place], [place] [address]    Special Requests   Final    BOTTLES DRAWN AEROBIC AND ANAEROBIC Blood Culture results may not be optimal due to an inadequate volume of blood received in culture bottles Performed at [facility], [address] [place], [place] [address]    Culture   Final    NO GROWTH 5 DAYS Performed at [facility], [address], [place], [place] [address]    Report Status 08/12/2023 FINAL  Final  Gastrointestinal Panel by PCR , Stool     Status: None   Collection Time: 08/10/23 10:40 AM   Specimen: Stool  Result Value Ref Range Status   Campylobacter species NOT DETECTED NOT DETECTED Final   Plesimonas shigelloides NOT DETECTED NOT DETECTED Final   Salmonella species NOT DETECTED NOT DETECTED Final   Yersinia enterocolitica NOT DETECTED NOT DETECTED Final   Vibrio species NOT DETECTED NOT DETECTED Final   Vibrio cholerae NOT DETECTED NOT DETECTED Final   Enteroaggregative E coli (EAEC) NOT DETECTED NOT DETECTED  Final   Enteropathogenic E coli (EPEC) NOT DETECTED NOT DETECTED Final   Enterotoxigenic E coli (ETEC) NOT DETECTED NOT DETECTED Final   Shiga like toxin producing E coli (STEC) NOT DETECTED NOT DETECTED Final   Shigella/Enteroinvasive E coli (EIEC) NOT DETECTED NOT DETECTED Final   Cryptosporidium NOT DETECTED NOT DETECTED Final   Cyclospora cayetanensis NOT DETECTED NOT DETECTED Final   Entamoeba histolytica NOT DETECTED NOT DETECTED Final   Giardia lamblia NOT DETECTED NOT DETECTED Final   Adenovirus F40/41 NOT DETECTED NOT DETECTED Final   Astrovirus NOT DETECTED NOT DETECTED Final   Norovirus  GI/GII NOT DETECTED NOT DETECTED Final   Rotavirus A NOT DETECTED NOT DETECTED Final   Sapovirus (I, II, IV, and V) NOT DETECTED NOT DETECTED Final    Comment: Performed at [facility], [address] [place], [place] [address]         Radiology Studies: No results found.      Scheduled Meds:  amiodarone  200 mg Oral Daily   apixaban  2.5 mg Oral BID   carvedilol  6.25 mg Oral BID WC   empagliflozin  10 mg Oral Daily   fluticasone furoate-vilanterol  1 puff Inhalation Daily   And   umeclidinium bromide  1 puff Inhalation Daily   hydrALAZINE  50 mg Oral TID   isosorbide mononitrate  30 mg Oral QHS   pantoprazole  40 mg Oral Daily   traZODone  100 mg Oral QHS   Continuous Infusions:  piperacillin-tazobactam (ZOSYN)  IV 2.25 g (08/15/23 0144)     LOS: 3 days      Charise Killian, MD [facility] Pager [phone]  If 7PM-7AM, please contact night-coverage [email or web address] 08/15/2023, 9:18 AM

## 2023-08-15 NOTE — Progress Notes (Signed)
CC: possible diverticulitis Subjective: Patient reports feeling well this morning. Tolerated diet and is having bowel function  Objective: Vital signs in last 24 hours: Temp:  [97.6 F (36.4 C)-98.4 F (36.9 C)] 97.6 F (36.4 C) (01/23 0249) Pulse Rate:  [60-63] 63 (01/23 0249) Resp:  [16-18] 16 (01/23 0249) BP: (103-124)/(59-80) 124/75 (01/23 0249) SpO2:  [91 %-100 %] 91 % (01/23 0249) Last BM Date : 08/13/23  Intake/Output from previous day: No intake/output data recorded. Intake/Output this shift: No intake/output data recorded.  Physical exam:  Abdomen is soft, non-tender, slightly distended  Lab Results: CBC  Recent Labs    08/13/23 0540  WBC 4.3  HGB 10.4*  HCT 30.9*  PLT 173   BMET Recent Labs    08/13/23 0540 08/14/23 0629  NA 131* 131*  K 3.1* 4.3  CL 100 100  CO2 22 22  GLUCOSE 137* 115*  BUN 35* 36*  CREATININE 2.07* 2.60*  CALCIUM 7.6* 7.8*   PT/INR No results for input(s): "LABPROT", "INR" in the last 72 hours. ABG No results for input(s): "PHART", "HCO3" in the last 72 hours.  Invalid input(s): "PCO2", "PO2"  Studies/Results: No results found.  Anti-infectives: Anti-infectives (From admission, onward)    Start     Dose/Rate Route Frequency Ordered Stop   08/14/23 1000  piperacillin-tazobactam (ZOSYN) IVPB 2.25 g        2.25 g 100 mL/hr over 30 Minutes Intravenous Every 8 hours 08/14/23 0803     08/12/23 1800  piperacillin-tazobactam (ZOSYN) IVPB 3.375 g  Status:  Discontinued        3.375 g 12.5 mL/hr over 240 Minutes Intravenous Every 8 hours 08/12/23 1107 08/14/23 0803   08/12/23 0930  piperacillin-tazobactam (ZOSYN) IVPB 3.375 g        3.375 g 100 mL/hr over 30 Minutes Intravenous  Once 08/12/23 0920 08/12/23 1028       Assessment/Plan:  Patient with history of heart failure who is seen in the hospital last week for this that readmitted secondary to possible diverticulitis  Worsening AKI today but do not think this is  related to diverticulitis as her abdominal pain is improving. Recommend continuing abx for total of 7 days. Okay to give po abx. Surgery to sign off for now, please call with any questions or concerns.   Baker Pierini, M.D. [facility]

## 2023-08-15 NOTE — Evaluation (Signed)
Physical Therapy Evaluation Patient Details Name: Leslie Parker MRN: [id number] DOB: June 24, 1933 Today's Date: 08/15/2023  History of Present Illness  Pt is a 89 y.o. female admitted with suspected diverticulitis. PMH significant for HfrEF, AFIB, HTN, CKD Stage 4, sick sinus syndrome s/p pacemaker, COPD, DM & asthma. Recent hospitalization 1/15-1/19 for acute respiratory failure with diarrhea episodes.  Clinical Impression  Patient admitted with the above. PTA, patient lives alone in a senior living apartment and reports being modI with rollator in the apartment and family uses transport w/c for community mobility. Family assists with iADLs. Patient presents with weakness, impaired balance, decreased activity tolerance, and mild cognitive deficits. Required CGA-minA for bed mobility and CGA for sit to stand from low surface. Ambulated ~25' with RW and CGA prior to complaining of BLE weakness and requesting to sit down. Transferred to [facility] once rested to void. Small blood noted during pericare, MD and RN notified. Patient will benefit from skilled PT services during acute stay to address listed deficits. Patient will benefit from ongoing therapy at discharge to maximize functional independence and safety.         If plan is discharge home, recommend the following: Assist for transportation;Assistance with cooking/housework   Can travel by private vehicle        Equipment Recommendations None recommended by PT  Recommendations for Other Services       Functional Status Assessment Patient has had a recent decline in their functional status and demonstrates the ability to make significant improvements in function in a reasonable and predictable amount of time.     Precautions / Restrictions Precautions Precautions: Fall Restrictions Weight Bearing Restrictions Per Provider Order: No      Mobility  Bed Mobility Overal bed mobility: Needs Assistance Bed Mobility: Supine to Sit     Supine  to sit: Contact guard, Min assist (minA to scoot hips forward)     General bed mobility comments: increased time, HOB elevated, minA to scoot hips forward with use of bed pad    Transfers Overall transfer level: Needs assistance Equipment used: Rolling Quadarius Henton (2 wheels) Transfers: Sit to/from Stand, Bed to chair/wheelchair/BSC Sit to Stand: Contact guard assist           General transfer comment: Multiple transfers completed throughout session, sit<>stand from EOB with RW, cues for hand placement, SPT recliner <> BSC CGA    Ambulation/Gait Ambulation/Gait assistance: Contact guard assist Gait Distance (Feet): 25 Feet Assistive device: Rolling Shawndra Clute (2 wheels) Gait Pattern/deviations: Step-to pattern, Decreased stride length Gait velocity: decreased     General Gait Details: Ambulated short distance in the room and complaining of weakness in BLEs. Requesting to sit down after 25'  Stairs            Wheelchair Mobility     Tilt Bed    Modified Rankin (Stroke Patients Only)       Balance Overall balance assessment: Needs assistance Sitting-balance support: No upper extremity supported, Feet supported Sitting balance-[name]: Good     Standing balance support: Bilateral upper extremity supported, Reliant on assistive device for balance Standing balance-[name]: Good                               Pertinent Vitals/Pain Pain Assessment Pain Assessment: No/denies pain    Home Living Family/patient expects to be discharged to:: Private residence Living Arrangements: Alone Available Help at Discharge: Family;Available PRN/intermittently Type of Home: Apartment Home  Access: Level entry       Home Layout: One level Home Equipment: Rollator (4 wheels);Cane - single point;Grab bars - tub/shower;Grab bars - toilet;Lift chair;Transport chair;Tub bench;Rolling Niels Cranshaw (2 wheels);Shower seat Additional Comments: Senior living apartment     Prior Function Prior Level of Function : Needs assist;History of Falls (last six months)             Mobility Comments: 1 fall in July; rollator at baseline, family assists with leaving home using transport w/c ADLs Comments: Seated shower, mod indep with dressing, indep with meals, meds. Family assists with groceries, driving, trash, and mail     Extremity/Trunk Assessment   Upper Extremity Assessment Upper Extremity Assessment: Defer to OT evaluation    Lower Extremity Assessment Lower Extremity Assessment: Generalized weakness    Cervical / Trunk Assessment Cervical / Trunk Assessment: Kyphotic  Communication   Communication Communication: No apparent difficulties  Cognition Arousal: Alert Behavior During Therapy: WFL for tasks assessed/performed Overall Cognitive Status: Within Functional Limits for tasks assessed                                 General Comments: mild confusion noted with conversation        General Comments      Exercises     Assessment/Plan    PT Assessment Patient needs continued PT services  PT Problem List Decreased activity tolerance;Decreased mobility;Decreased safety awareness;Decreased knowledge of use of DME;Decreased strength;Decreased range of motion;Decreased balance       PT Treatment Interventions DME instruction;Gait training;Functional mobility training;Therapeutic activities;Therapeutic exercise;Balance training;Patient/family education    PT Goals (Current goals can be found in the Care Plan section)  Acute Rehab PT Goals Patient Stated Goal: to go home PT Goal Formulation: With patient Time For Goal Achievement: 08/29/23 Potential to Achieve Goals: Good    Frequency Min 1X/week     Co-evaluation PT/OT/SLP Co-Evaluation/Treatment: Yes Reason for Co-Treatment: For patient/therapist safety;To address functional/ADL transfers PT goals addressed during session: Mobility/safety with mobility;Balance OT  goals addressed during session: ADL's and self-care       AM-PAC PT "6 Clicks" Mobility  Outcome Measure Help needed turning from your back to your side while in a flat bed without using bedrails?: None Help needed moving from lying on your back to sitting on the side of a flat bed without using bedrails?: None Help needed moving to and from a bed to a chair (including a wheelchair)?: A Little Help needed standing up from a chair using your arms (e.g., wheelchair or bedside chair)?: A Little Help needed to walk in hospital room?: A Little Help needed climbing 3-5 steps with a railing? : A Little 6 Click Score: 20    End of Session Equipment Utilized During Treatment: Gait belt Activity Tolerance: Patient tolerated treatment well Patient left: in chair;with call bell/phone within reach;with chair alarm set;Other (comment) (MD present) Nurse Communication: Mobility status;Other (comment) (small amount of bright red blood from rectum during pericare) PT Visit Diagnosis: Muscle weakness (generalized) (M62.81);Difficulty in walking, not elsewhere classified (R26.2)    Time: 5784-6962 PT Time Calculation (min) (ACUTE ONLY): 28 min   Charges:   PT Evaluation $PT Eval Moderate Complexity: 1 Mod   PT General Charges $$ ACUTE PT VISIT: 1 Visit         [name], PT, DPT Physical Therapist - [facility]  [facility]   Aino Heckert A Cruz Devilla 08/15/2023, 12:27  PM

## 2023-08-16 DIAGNOSIS — K5792 Diverticulitis of intestine, part unspecified, without perforation or abscess without bleeding: Secondary | ICD-10-CM | POA: Diagnosis not present

## 2023-08-16 LAB — BASIC METABOLIC PANEL
Anion gap: 10 (ref 5–15)
BUN: 39 mg/dL — ABNORMAL HIGH (ref 8–23)
CO2: 21 mmol/L — ABNORMAL LOW (ref 22–32)
Calcium: 7.8 mg/dL — ABNORMAL LOW (ref 8.9–10.3)
Chloride: 104 mmol/L (ref 98–111)
Creatinine, Ser: 2.73 mg/dL — ABNORMAL HIGH (ref 0.44–1.00)
GFR, Estimated: 16 mL/min — ABNORMAL LOW (ref >60)
Glucose, Bld: 91 mg/dL (ref 70–99)
Potassium: 3.7 mmol/L (ref 3.5–5.1)
Sodium: 135 mmol/L (ref 135–145)

## 2023-08-16 MED ORDER — AMOXICILLIN-POT CLAVULANATE 500-125 MG PO TABS: 1.0000 | ORAL_TABLET | Freq: Two times a day (BID) | ORAL | 0 refills | Status: DC

## 2023-08-16 NOTE — Progress Notes (Signed)
Physical Therapy Treatment Patient Details Name: Leslie Parker MRN: [id number] DOB: 1933-03-04 Today's Date: 08/16/2023   History of Present Illness Pt is a 89 y.o. female admitted with suspected diverticulitis. PMH significant for HfrEF, AFIB, HTN, CKD Stage 4, sick sinus syndrome s/p pacemaker, COPD, DM & asthma. Recent hospitalization 1/15-1/19 for acute respiratory failure with diarrhea episodes.    PT Comments  Patient semi-reclined in bed on arrival and agreeable to PT treatment session. Patient required minA for bed mobility and supervision for sit to stand but required increased time to complete. Ambulated 25' with RW and CGA-supervision. Complaining of B feet pain throughout. Discharge plan remains appropriate.     If plan is discharge home, recommend the following: Assist for transportation;Assistance with cooking/housework   Can travel by private vehicle        Equipment Recommendations  None recommended by PT    Recommendations for Other Services       Precautions / Restrictions Precautions Precautions: Fall Restrictions Weight Bearing Restrictions Per Provider Order: No     Mobility  Bed Mobility Overal bed mobility: Needs Assistance Bed Mobility: Supine to Sit, Sit to Supine     Supine to sit: Min assist Sit to supine: Min assist   General bed mobility comments: assist for BLE back into bed    Transfers Overall transfer level: Needs assistance Equipment used: Rolling [name] (2 wheels) Transfers: Sit to/from Stand Sit to Stand: Supervision           General transfer comment: increased time and effort to complete    Ambulation/Gait Ambulation/Gait assistance: Contact guard assist, Supervision Gait Distance (Feet): 25 Feet Assistive device: Rolling [name] (2 wheels) Gait Pattern/deviations: Step-to pattern, Decreased stride length Gait velocity: decreased     General Gait Details: self limiting. Ambulated very short distance which is comparable  to short household distance.   Stairs             Wheelchair Mobility     Tilt Bed    Modified Rankin (Stroke Patients Only)       Balance Overall balance assessment: Needs assistance Sitting-balance support: No upper extremity supported, Feet supported Sitting balance-[name]: Good     Standing balance support: Bilateral upper extremity supported, Reliant on assistive device for balance Standing balance-[name]: Good                              Cognition Arousal: Alert Behavior During Therapy: WFL for tasks assessed/performed Overall Cognitive Status: Within Functional Limits for tasks assessed                                          Exercises      General Comments        Pertinent Vitals/Pain Pain Assessment Pain Assessment: Faces Faces Pain Scale: Hurts little more Pain Location: bottoms of feet Pain Descriptors / Indicators: Grimacing, Guarding Pain Intervention(s): Monitored during session, Limited activity within patient's tolerance, Repositioned    Home Living                          Prior Function            PT Goals (current goals can now be found in the care plan section) Acute Rehab PT Goals Patient Stated Goal: to go home PT  Goal Formulation: With patient Time For Goal Achievement: 08/29/23 Potential to Achieve Goals: Good Progress towards PT goals: Progressing toward goals    Frequency    Min 1X/week      PT Plan      Co-evaluation              AM-PAC PT "6 Clicks" Mobility   Outcome Measure  Help needed turning from your back to your side while in a flat bed without using bedrails?: None Help needed moving from lying on your back to sitting on the side of a flat bed without using bedrails?: None Help needed moving to and from a bed to a chair (including a wheelchair)?: A Little Help needed standing up from a chair using your arms (e.g., wheelchair or bedside  chair)?: A Little Help needed to walk in hospital room?: A Little Help needed climbing 3-5 steps with a railing? : A Little 6 Click Score: 20    End of Session   Activity Tolerance: Patient tolerated treatment well Patient left: in bed;with call bell/phone within reach;with bed alarm set Nurse Communication: Mobility status PT Visit Diagnosis: Muscle weakness (generalized) (M62.81);Difficulty in walking, not elsewhere classified (R26.2)     Time: 1610-9604 PT Time Calculation (min) (ACUTE ONLY): 11 min  Charges:    $Therapeutic Activity: 8-22 mins PT General Charges $$ ACUTE PT VISIT: 1 Visit                     [name], PT, DPT Physical Therapist - [facility]  [facility]    Nazareth Kirk A Twyla Dais 08/16/2023, 1:02 PM

## 2023-08-16 NOTE — Plan of Care (Signed)

## 2023-08-16 NOTE — Plan of Care (Signed)
  Problem: Pain Managment: Goal: General experience of comfort will improve and/or be controlled Outcome: Progressing   Problem: Safety: Goal: Ability to remain free from injury will improve Outcome: Progressing   Problem: Skin Integrity: Goal: Risk for impaired skin integrity will decrease Outcome: Progressing

## 2023-08-16 NOTE — Discharge Summary (Signed)
Physician Discharge Summary  Leslie Parker:[id number] DOB: 1932/09/15 DOA: 08/12/2023  PCP: [name], MD  Admit date: 08/12/2023 Discharge date: 08/16/2023  Admitted From: home  Disposition:  home w/ home health   Recommendations for Outpatient Follow-up:  Follow up with PCP in 1-2 weeks Get BMP to check Cr/GFR in 1-2 weeks   Home Health: yes Equipment/Devices:  Discharge Condition: stable  CODE STATUS: full  Diet recommendation: Heart Healthy / Carb Modified   Brief/Interim Summary: HPI was taken from Dr. Alvester Morin:  Leslie Parker is a 89 y.o. female with medical history significant of chronic HFrEF, asthma, atrial fibrillation, stage III-IVCKD type 2 diabetes, hypertension presenting with ileus versus diverticulitis.  Patient noted to have been admitted January 15 through January 19 for issues including acute respiratory failure with hypoxia, acute on chronic HFrEF.  Patient noted to have been appropriately diuresed with improvement in respiratory status.  Had episodes of diarrhea while admitted with negative GI workup.  Diarrhea felt to be antibiotic associated.  Given course of symptoms had improved at the time of discharge.  Per report, patient tried to eat some food overnight with significant onset of pain recurrent vomiting and malaise.  No chest pain or shortness of breath.  No lower extremity swelling.  Abdominal pain is predominantly in the lower abdomen.  No reported black or bloody bowel movements.   Discharge Diagnoses:  Principal Problem:   Ileus (HCC) Active Problems:   Paroxysmal atrial fibrillation (HCC)   HFrEF (heart failure with reduced ejection fraction) (HCC)   Essential hypertension   Asthma, chronic   Diabetes mellitus without complication (HCC)   COPD (chronic obstructive pulmonary disease) (HCC)   Sick sinus syndrome (HCC)   Large bowel obstruction (HCC)   CKD (chronic kidney disease) stage 4, GFR 15-29 ml/min (HCC) Likely diverticulitis: d/c IV  zosyn as Cr is trending up. Continue on augmentin. Having bowel movements, unlikely ileus    Chronic systolic CHF: echo 07/2022 w/ EF 25-30%. Continue on coreg and restart lasix at d/c. Monitor I/Os   PAF: continue on amio, eliquis, coreg   HTN: continue on imdur, coreg    Sick sinus syndrome: s/p PPM.    COPD: w/o exacerbation. Bronchodilators prn     DM2: fair control, HbA1c 7.0. Restart jardiance at d/c    CKDIV: Cr is trending down today. Avoid nephrotoxic meds    Hypokalemia: WNL today    Discharge Instructions  Discharge Instructions     Diet - low sodium heart healthy   Complete by: As directed    Diet Carb Modified   Complete by: As directed    Discharge instructions   Complete by: As directed    F/u w/ PCP in 1-2 weeks. Get BMP in 1-2 weeks to check Cr/GFR (kidney function)   Increase activity slowly   Complete by: As directed    No wound care   Complete by: As directed       Allergies as of 08/16/2023       Reactions   Baclofen Other (See Comments)   Elevated Cr   Simvastatin Other (See Comments)   Pt denies Elevated LFTs with simva 40mg , stopped and resolved (see MD note 11/10/13)        Medication List     TAKE these medications    albuterol 108 (90 Base) MCG/ACT inhaler Commonly known as: VENTOLIN HFA Inhale 2 puffs into the lungs every 6 (six) hours as needed.   amiodarone 200 MG tablet Commonly  known as: PACERONE Take 200 mg by mouth daily.   amoxicillin-clavulanate 500-125 MG tablet Commonly known as: AUGMENTIN Take 1 tablet by mouth 2 (two) times daily for 3 days.   apixaban 2.5 MG Tabs tablet Commonly known as: ELIQUIS Take 1 tablet (2.5 mg total) by mouth 2 (two) times daily.   ascorbic acid 250 MG Chew Commonly known as: VITAMIN C Chew 250 mg by mouth daily.   B-12 2500 MCG Tabs Take 2,500 mcg by mouth daily.   carvedilol 6.25 MG tablet Commonly known as: COREG Take 1 tablet (6.25 mg total) by mouth 2 (two) times daily  with a meal.   colestipol 1 g tablet Commonly known as: COLESTID Take 1 tablet by mouth 2 (two) times daily.   empagliflozin 10 MG Tabs tablet Commonly known as: JARDIANCE Take 1 tablet (10 mg total) by mouth daily.   furosemide 40 MG tablet Commonly known as: LASIX Take 1 tablet (40 mg total) by mouth in the morning.   hydrALAZINE 50 MG tablet Commonly known as: APRESOLINE Take 1 tablet (50 mg total) by mouth 3 (three) times daily.   ipratropium-albuterol 0.5-2.5 (3) MG/3ML Soln Commonly known as: DUONEB Inhale 3 mLs into the lungs every 6 (six) hours as needed.   isosorbide mononitrate 30 MG 24 hr tablet Commonly known as: IMDUR Take 1 tablet (30 mg total) by mouth at bedtime.   Magnesium Oxide -Mg Supplement 500 MG Caps Take 500 mg by mouth daily.   multivitamin with minerals Tabs tablet Take 1 tablet by mouth daily. One-A-Day Women's Vitamin   omeprazole 20 MG capsule Commonly known as: PRILOSEC Take 20 mg by mouth every morning.   ondansetron 4 MG disintegrating tablet Commonly known as: ZOFRAN-ODT Take 4 mg by mouth every 8 (eight) hours as needed.   traMADol 50 MG tablet Commonly known as: ULTRAM Take 1 tablet by mouth every 6 (six) hours as needed.   traZODone 100 MG tablet Commonly known as: DESYREL Take 100 mg by mouth at bedtime.   Trelegy Ellipta 100-62.5-25 MCG/ACT Aepb Generic drug: Fluticasone-Umeclidin-Vilant Inhale 1 puff into the lungs daily.   Vitamin D-1000 Max St 25 MCG (1000 UT) tablet Generic drug: Cholecalciferol Take 1,000 Units by mouth daily.        Allergies  Allergen Reactions   Baclofen Other (See Comments)    Elevated Cr   Simvastatin Other (See Comments)    Pt denies  Elevated LFTs with simva 40mg , stopped and resolved (see MD note 11/10/13)    Consultations:   Procedures/Studies: CT ABDOMEN PELVIS WO CONTRAST Result Date: 08/12/2023 CLINICAL DATA:  Nausea and weakness.  Bowel obstruction suspected. EXAM: CT  ABDOMEN AND PELVIS WITHOUT CONTRAST TECHNIQUE: Multidetector CT imaging of the abdomen and pelvis was performed following the standard protocol without IV contrast. RADIATION DOSE REDUCTION: This exam was performed according to the departmental dose-optimization program which includes automated exposure control, adjustment of the mA and/or kV according to patient size and/or use of iterative reconstruction technique. COMPARISON:  02/19/2013 FINDINGS: Lower chest: Dependent atelectasis noted in both lower lobes. Moderate bilateral pleural effusions. Hepatobiliary: No focal mass lesion identified in the liver. Gallbladder surgically absent. Intra and extrahepatic biliary duct dilatation is progressive in the interval with common bile duct measuring up to 19 mm diameter. Pancreas: Pancreas is diffusely atrophic without main duct dilatation. 3.9 x 3.0 cm well-defined homogeneous fluid density structure along the tail of the pancreas (23/2) is new in the interval and may be an exophytic cyst  from the pancreatic tail. Immediately adjacent to the splenic artery, splenic artery aneurysm is not excluded but considered much less likely. Spleen: 19 mm hypoattenuating lesion in the dome of the spleen cannot be definitively characterized but is likely benign. Adrenals/Urinary Tract: No adrenal nodule or mass. Multiple cysts of varying size and attenuation are noted in both kidneys measuring up to 9 cm on the right and 5 cm on the left. 2.3 cm lesion interpolar right kidney has attenuation too high to be a simple cyst. 2.3 cm exophytic lesion upper pole left kidney has heterogeneous attenuation. There is a 2.1 cm lesion in the interpolar left kidney with attenuation too high to be a simple cyst. Additional high density lesions are seen in both kidneys. No hydronephrosis. No evidence for hydroureter. Bladder largely obscured by beam hardening artifact from orthopedic hardware in both hips. Stomach/Bowel: Surgical changes in the  esophagogastric junction are compatible with prior fundoplication. Duodenum is normally positioned as is the ligament of Treitz. No small bowel wall thickening. No small bowel dilatation. The terminal ileum is normal. Tiny appendicolith noted with otherwise normal appendix. Right, transverse, and descending colon are diffusely distended with gas and fluid. Advanced diverticular disease is seen in the nondilated sigmoid colon. Wall thickening in the sigmoid segment the colon is likely related to the diverticulosis. Possible subtle pericolonic edema along the proximal sigmoid segment (axial 60/2 and coronal 35/5). No abrupt transition in the left colon although luminal narrowing is seen in the sigmoid segment. Vascular/Lymphatic: There is moderate atherosclerotic calcification of the abdominal aorta without aneurysm. There is no gastrohepatic or hepatoduodenal ligament lymphadenopathy. No retroperitoneal or mesenteric lymphadenopathy. No pelvic sidewall lymphadenopathy. Reproductive: There is no adnexal mass. Other: Trace free fluid is seen in the left paracolic gutter and in the right pelvis. Musculoskeletal: Mild body wall edema evident. Fixation hardware noted proximal right femur with left hip prosthesis evident. Bones are diffusely demineralized. Diffuse degenerative disc disease noted lumbar spine with chronic compression deformity noted at the T12 level. IMPRESSION: 1. Right, transverse, and descending colon are diffusely distended with gas and fluid. No abrupt transition in the left colon although luminal narrowing is seen in the sigmoid segment where there is advanced diverticular disease in wall thickening likely secondary to the diverticulosis. Diverticular stricture not excluded. Possible subtle pericolonic edema in the proximal sigmoid segment raising the question of diverticulitis. Imaging features are compatible with colonic ileus. 2. 3.9 x 3.0 cm well-defined homogeneous fluid density structure along  the tail of the pancreas is new in the interval and may be an exophytic cyst from the pancreatic tail. Immediately adjacent to the splenic artery, splenic artery aneurysm is not excluded but considered much less likely. MRI abdomen with and without contrast recommended to further evaluate. 3. Moderate bilateral pleural effusions with dependent atelectasis in both lower lobes. 4. Multiple cysts of varying size and attenuation in both kidneys. There are a few lesions in both kidneys which have attenuation too high to be simple cysts. These could also be further evaluated at the time of abdominal MRI. 5. Progressive intra and extrahepatic biliary duct dilatation status post cholecystectomy. Correlation with liver function test recommended. 6.  Aortic Atherosclerosis (ICD10-I70.0). Electronically Signed   By: Kennith Center M.D.   On: 08/12/2023 06:25   DG Chest Port 1 View Result Date: 08/10/2023 CLINICAL DATA:  89 year old female history of acute on chronic diastolic congestive heart failure. EXAM: PORTABLE CHEST 1 VIEW COMPARISON:  Chest x-ray 08/07/2023. FINDINGS: Left-sided pacemaker device in  place with lead tips projecting over the expected location of the right atrium and right ventricle. Lung volumes are normal. Bibasilar opacities are noted (left-greater-than-right) which may reflect areas of atelectasis and/or consolidation. Small right and moderate left pleural effusions. Diffuse interstitial prominence and peribronchial cuffing. Cephalization of the pulmonary vasculature. Mild cardiomegaly. Upper mediastinal contours are within normal limits. Atherosclerotic calcifications are noted in the thoracic aorta. Status post right shoulder arthroplasty. IMPRESSION: 1. The appearance the chest is most compatible with acute congestive heart failure, as above. 2. Aortic atherosclerosis. Electronically Signed   By: Trudie Reed M.D.   On: 08/10/2023 11:33   ECHOCARDIOGRAM COMPLETE Result Date: 08/09/2023     ECHOCARDIOGRAM REPORT   Patient Name:   Leslie Parker Date of Exam: 08/09/2023 Medical Rec #:  [id number]     Height:       69.0 in Accession #:    [id number]    Weight:       194.5 lb Date of Birth:  12/20/1932      BSA:          2.042 m Patient Age:    89 years      BP:           172/81 mmHg Patient Gender: F             HR:           62 bpm. Exam Location:  [facility] Procedure: 2D Echo, 3D Echo, Cardiac Doppler and Color Doppler Indications:     CHF  History:         Patient has prior history of Echocardiogram examinations, most                  recent 02/09/2023. CHF, Abnormal ECG, COPD, Arrythmias:Atrial                  Fibrillation, Signs/Symptoms:Dizziness/Lightheadedness; Risk                  Factors:Hypertension and Diabetes. CKD.  Sonographer:     Mikki Harbor Referring Phys:  [id number] ERIC CHEN Diagnosing Phys: Debbe Odea MD IMPRESSIONS  1. Left ventricular ejection fraction, by estimation, is 25 to 30%. Left ventricular ejection fraction by 2D MOD biplane is 26.6 %. The left ventricle has severely decreased function. The left ventricle demonstrates global hypokinesis. There is mild left ventricular hypertrophy. Left ventricular diastolic parameters are indeterminate.  2. Right ventricular systolic function is normal. The right ventricular size is normal. There is mildly elevated pulmonary artery systolic pressure.  3. Left atrial size was moderately dilated.  4. The mitral valve is degenerative. Moderate mitral valve regurgitation.  5. Tricuspid valve regurgitation is mild to moderate.  6. The aortic valve is calcified. Aortic valve regurgitation is not visualized. Aortic valve sclerosis/calcification is present, without any evidence of aortic stenosis. Aortic valve mean gradient measures 8.5 mmHg.  7. The inferior vena cava is dilated in size with >50% respiratory variability, suggesting right atrial pressure of 8 mmHg. FINDINGS  Left Ventricle: Left ventricular ejection fraction, by estimation, is 25  to 30%. Left ventricular ejection fraction by 2D MOD biplane is 26.6 %. The left ventricle has severely decreased function. The left ventricle demonstrates global hypokinesis. The left ventricular internal cavity size was normal in size. There is mild left ventricular hypertrophy. Left ventricular diastolic parameters are indeterminate. Right Ventricle: The right ventricular size is normal. No increase in right ventricular wall thickness. Right ventricular systolic function is normal. There is  mildly elevated pulmonary artery systolic pressure. The tricuspid regurgitant velocity is 2.81  m/s, and with an assumed right atrial pressure of 8 mmHg, the estimated right ventricular systolic pressure is 39.6 mmHg. Left Atrium: Left atrial size was moderately dilated. Right Atrium: Right atrial size was normal in size. Pericardium: There is no evidence of pericardial effusion. Mitral Valve: The mitral valve is degenerative in appearance. Moderate mitral valve regurgitation. MV peak gradient, 8.4 mmHg. The mean mitral valve gradient is 3.0 mmHg. Tricuspid Valve: The tricuspid valve is normal in structure. Tricuspid valve regurgitation is mild to moderate. Aortic Valve: The aortic valve is calcified. Aortic valve regurgitation is not visualized. Aortic valve sclerosis/calcification is present, without any evidence of aortic stenosis. Aortic valve mean gradient measures 8.5 mmHg. Aortic valve peak gradient measures 18.0 mmHg. Pulmonic Valve: The pulmonic valve was not well visualized. Pulmonic valve regurgitation is not visualized. Aorta: The aortic root is normal in size and structure. Venous: The inferior vena cava is dilated in size with greater than 50% respiratory variability, suggesting right atrial pressure of 8 mmHg. IAS/Shunts: No atrial level shunt detected by color flow Doppler.  LEFT VENTRICLE PLAX 2D                        Biplane EF (MOD) LVIDd:         5.00 cm         LV Biplane EF:   Left LVIDs:         3.70 cm                           ventricular LV PW:         1.20 cm                          ejection LV IVS:        1.50 cm                          fraction by                                                 2D MOD                                                 biplane is LV Volumes (MOD)                                26.6 %. LV vol d, MOD    108.0 ml A2C:                           Diastology LV vol d, MOD    105.0 ml      LV e' medial:    3.48 cm/s A4C:                           LV E/e' medial:  19.4 LV vol s, MOD    79.4  ml       LV e' lateral:   9.79 cm/s A2C:                           LV E/e' lateral: 6.9 LV vol s, MOD    76.4 ml A4C: LV SV MOD A2C:   28.6 ml LV SV MOD A4C:   105.0 ml LV SV MOD BP:    28.3 ml RIGHT VENTRICLE RV Basal diam:  3.55 cm RV Mid diam:    3.00 cm RV S prime:     23.70 cm/s TAPSE (M-mode): 1.9 cm LEFT ATRIUM             Index        RIGHT ATRIUM           Index LA diam:        4.10 cm 2.01 cm/m   RA Area:     15.10 cm LA Vol (A2C):   87.3 ml 42.76 ml/m  RA Volume:   41.20 ml  20.18 ml/m LA Vol (A4C):   86.1 ml 42.17 ml/m LA Biplane Vol: 89.0 ml 43.59 ml/m  AORTIC VALVE                    PULMONIC VALVE AV Vmax:           212.00 cm/s  PV Vmax:       1.04 m/s AV Vmean:          127.500 cm/s PV Peak grad:  4.3 mmHg AV VTI:            0.420 m AV Peak Grad:      18.0 mmHg AV Mean Grad:      8.5 mmHg LVOT Vmax:         83.00 cm/s LVOT Vmean:        48.900 cm/s LVOT VTI:          0.173 m LVOT/AV VTI ratio: 0.41 MITRAL VALVE                TRICUSPID VALVE MV Area (PHT): 6.02 cm     TR Peak grad:   31.6 mmHg MV Peak grad:  8.4 mmHg     TR Vmax:        281.00 cm/s MV Mean grad:  3.0 mmHg MV Vmax:       1.45 m/s     SHUNTS MV Vmean:      75.6 cm/s    Systemic VTI: 0.17 m MV Decel Time: 126 msec MV E velocity: 67.60 cm/s MV A velocity: 124.00 cm/s MV E/A ratio:  0.55 Debbe Odea MD Electronically signed by Debbe Odea MD Signature Date/Time: 08/09/2023/4:34:38 PM    Final    DG Chest  Port 1 View Result Date: 08/07/2023 CLINICAL DATA:  [id number] Sepsis (HCC) [id number]. Respiratory distress. Difficulty breathing. EXAM: PORTABLE CHEST 1 VIEW COMPARISON:  06/16/2023. FINDINGS: Low lung volume. Redemonstration of left retrocardiac airspace opacity obscuring the left hemidiaphragm, descending thoracic aorta and blunting the left lateral costophrenic angle, suggesting combination of left lung atelectasis and/or consolidation with pleural effusion. There is interval worsening since the prior study. There is also probable small layering right pleural effusion. There is mild diffuse pulmonary vascular congestion. No pneumothorax. Stable cardio-mediastinal silhouette. There is a left sided 2-lead pacemaker. No acute osseous abnormalities. Right reverse shoulder arthroplasty noted. The soft tissues are within normal limits. IMPRESSION: *Findings favor congestive heart failure/pulmonary edema. *  There is worsening left retrocardiac opacity, as described above. Electronically Signed   By: Jules Schick M.D.   On: 08/07/2023 15:40   (Echo, Carotid, EGD, Colonoscopy, ERCP)    Subjective: Pt c/o fatigue    Discharge Exam: Vitals:   08/16/23 0754 08/16/23 1205  BP: 131/65 133/63  Pulse: 65 62  Resp: 18 19  Temp: 98.6 F (37 C) 98 F (36.7 C)  SpO2: 92% 92%   Vitals:   08/15/23 2036 08/16/23 0404 08/16/23 0754 08/16/23 1205  BP: 124/64 134/64 131/65 133/63  Pulse: (!) 59 61 65 62  Resp: 20 20 18 19   Temp: 97.7 F (36.5 C) (!) 97.5 F (36.4 C) 98.6 F (37 C) 98 F (36.7 C)  TempSrc: Oral  Oral Oral  SpO2: 93% 92% 92% 92%  Weight:      Height:        General: Pt is alert, awake, not in acute distress Cardiovascular: S1/S2 +, no rubs, no gallops Respiratory: CTA bilaterally, no wheezing, no rhonchi Abdominal: Soft, NT, bowel sounds + Extremities: no edema, no cyanosis    The results of significant diagnostics from this hospitalization (including imaging, microbiology, ancillary  and laboratory) are listed below for reference.     Microbiology: Recent Results (from the past 240 hours)  Culture, blood (Routine x 2)     Status: None   Collection Time: 08/07/23 12:48 PM   Specimen: BLOOD  Result Value Ref Range Status   Specimen Description   Final    BLOOD BLOOD RIGHT ARM Performed at [facility], [address] [place], [place] [address]    Special Requests   Final    BOTTLES DRAWN AEROBIC ONLY Blood Culture results may not be optimal due to an inadequate volume of blood received in culture bottles Performed at [facility], [address], [place], [place] [address]    Culture   Final    NO GROWTH 5 DAYS Performed at [facility], [address] [place], [place] [address]    Report Status 08/12/2023 FINAL  Final  Resp panel by RT-PCR (RSV, Flu A&B, Covid) Anterior Nasal Swab     Status: None   Collection Time: 08/07/23 12:48 PM   Specimen: Anterior Nasal Swab  Result Value Ref Range Status   SARS Coronavirus 2 by RT PCR NEGATIVE NEGATIVE Final    Comment: (NOTE) SARS-CoV-2 target nucleic acids are NOT DETECTED.  The SARS-CoV-2 RNA is generally detectable in upper respiratory specimens during the acute phase of infection. The lowest concentration of SARS-CoV-2 viral copies this assay can detect is 138 copies/mL. A negative result does not preclude SARS-Cov-2 infection and should not be used as the sole basis for treatment or other patient management decisions. A negative result may occur with  improper specimen collection/handling, submission of specimen other than nasopharyngeal swab, presence of viral mutation(s) within the areas targeted by this assay, and inadequate number of viral copies(<138 copies/mL). A negative result must be combined with clinical observations, patient history, and epidemiological information. The expected result is Negative.  Fact Sheet for Patients:   BloggerCourse.com  Fact Sheet for Healthcare Providers:  SeriousBroker.it  This test is no t yet approved or cleared by the [place] FDA and  has been authorized for detection and/or diagnosis of SARS-CoV-2 by FDA under an Emergency Use Authorization (EUA). This EUA will remain  in effect (meaning this test can be used) for the duration of the COVID-19 declaration under Section 564(b)(1)  of the Act, 21 U.S.C.section 360bbb-3(b)(1), unless the authorization is terminated  or revoked sooner.       Influenza A by PCR NEGATIVE NEGATIVE Final   Influenza B by PCR NEGATIVE NEGATIVE Final    Comment: (NOTE) The Xpert Xpress SARS-CoV-2/FLU/RSV plus assay is intended as an aid in the diagnosis of influenza from Nasopharyngeal swab specimens and should not be used as a sole basis for treatment. Nasal washings and aspirates are unacceptable for Xpert Xpress SARS-CoV-2/FLU/RSV testing.  Fact Sheet for Patients: BloggerCourse.com  Fact Sheet for Healthcare Providers: SeriousBroker.it  This test is not yet approved or cleared by the [place] FDA and has been authorized for detection and/or diagnosis of SARS-CoV-2 by FDA under an Emergency Use Authorization (EUA). This EUA will remain in effect (meaning this test can be used) for the duration of the COVID-19 declaration under Section 564(b)(1) of the Act, 21 U.S.C. section 360bbb-3(b)(1), unless the authorization is terminated or revoked.     Resp Syncytial Virus by PCR NEGATIVE NEGATIVE Final    Comment: (NOTE) Fact Sheet for Patients: BloggerCourse.com  Fact Sheet for Healthcare Providers: SeriousBroker.it  This test is not yet approved or cleared by the [place] FDA and has been authorized for detection and/or diagnosis of SARS-CoV-2 by FDA under an Emergency Use  Authorization (EUA). This EUA will remain in effect (meaning this test can be used) for the duration of the COVID-19 declaration under Section 564(b)(1) of the Act, 21 U.S.C. section 360bbb-3(b)(1), unless the authorization is terminated or revoked.  Performed at [facility], [address] [place], [place] [address]   Culture, blood (Routine x 2)     Status: None   Collection Time: 08/07/23  1:17 PM   Specimen: BLOOD  Result Value Ref Range Status   Specimen Description   Final    BLOOD BLOOD LEFT ARM Performed at [facility], [address], [place], [place] [address]    Special Requests   Final    BOTTLES DRAWN AEROBIC AND ANAEROBIC Blood Culture results may not be optimal due to an inadequate volume of blood received in culture bottles Performed at [facility], [address], [place], [place] [address]    Culture   Final    NO GROWTH 5 DAYS Performed at [facility], [address] [place], [place] [address]    Report Status 08/12/2023 FINAL  Final  Gastrointestinal Panel by PCR , Stool     Status: None   Collection Time: 08/10/23 10:40 AM   Specimen: Stool  Result Value Ref Range Status   Campylobacter species NOT DETECTED NOT DETECTED Final   Plesimonas shigelloides NOT DETECTED NOT DETECTED Final   Salmonella species NOT DETECTED NOT DETECTED Final   Yersinia enterocolitica NOT DETECTED NOT DETECTED Final   Vibrio species NOT DETECTED NOT DETECTED Final   Vibrio cholerae NOT DETECTED NOT DETECTED Final   Enteroaggregative E coli (EAEC) NOT DETECTED NOT DETECTED Final   Enteropathogenic E coli (EPEC) NOT DETECTED NOT DETECTED Final   Enterotoxigenic E coli (ETEC) NOT DETECTED NOT DETECTED Final   Shiga like toxin producing E coli (STEC) NOT DETECTED NOT DETECTED Final   Shigella/Enteroinvasive E coli (EIEC) NOT DETECTED NOT DETECTED Final   Cryptosporidium NOT DETECTED NOT DETECTED Final   Cyclospora cayetanensis NOT DETECTED  NOT DETECTED Final   Entamoeba histolytica NOT DETECTED NOT DETECTED Final   Giardia lamblia NOT DETECTED NOT DETECTED Final   Adenovirus F40/41 NOT DETECTED NOT  DETECTED Final   Astrovirus NOT DETECTED NOT DETECTED Final   Norovirus GI/GII NOT DETECTED NOT DETECTED Final   Rotavirus A NOT DETECTED NOT DETECTED Final   Sapovirus (I, II, IV, and V) NOT DETECTED NOT DETECTED Final    Comment: Performed at [facility], [address] [place], [place] [address]     Labs: BNP (last 3 results) Recent Labs    06/16/23 0912 08/07/23 1248 08/10/23 0400  BNP 1,602.3* 4,285.0* >4,500.0*   Basic Metabolic Panel: Recent Labs  Lab 08/10/23 0400 08/11/23 0303 08/12/23 0557 08/13/23 0540 08/14/23 0629 08/15/23 0946 08/16/23 0904  NA 136 134* 134* 131* 131* 132* 135  K 3.3* 3.9 3.6 3.1* 4.3 4.1 3.7  CL 103 102 103 100 100 100 104  CO2 24 22 17* 22 22 22  21*  GLUCOSE 110* 87 118* 137* 115* 110* 91  BUN 37* 35* 37* 35* 36* 39* 39*  CREATININE 1.95* 1.82* 1.88* 2.07* 2.60* 2.92* 2.73*  CALCIUM 7.5* 7.7* 8.2* 7.6* 7.8* 8.0* 7.8*  MG 1.4* 1.6*  --  2.1  --   --   --    Liver Function Tests: Recent Labs  Lab 08/10/23 0400 08/11/23 0303 08/12/23 0557 08/13/23 0540  AST 18 18 25 23   ALT 22 18 21 22   ALKPHOS 55 62 73 57  BILITOT 0.8 0.9 1.1 1.0  PROT 4.4* 4.7* 5.5* 4.6*  ALBUMIN 2.3* 2.5* 2.8* 2.3*   Recent Labs  Lab 08/12/23 0557  LIPASE 23   No results for input(s): "AMMONIA" in the last 168 hours. CBC: Recent Labs  Lab 08/12/23 0557 08/13/23 0540 08/15/23 0946  WBC 6.4 4.3 5.1  HGB 12.0 10.4* 11.0*  HCT 36.4 30.9* 32.9*  MCV 91.7 90.9 91.4  PLT 191 173 175   Cardiac Enzymes: No results for input(s): "CKTOTAL", "CKMB", "CKMBINDEX", "TROPONINI" in the last 168 hours. BNP: Invalid input(s): "POCBNP" CBG: Recent Labs  Lab 08/10/23 1220 08/10/23 1612 08/10/23 2138 08/11/23 0840 08/12/23 2109  GLUCAP 121* 119* 90 101* 101*   D-Dimer No results for  input(s): "DDIMER" in the last 72 hours. Hgb A1c No results for input(s): "HGBA1C" in the last 72 hours. Lipid Profile No results for input(s): "CHOL", "HDL", "LDLCALC", "TRIG", "CHOLHDL", "LDLDIRECT" in the last 72 hours. Thyroid function studies No results for input(s): "TSH", "T4TOTAL", "T3FREE", "THYROIDAB" in the last 72 hours.  Invalid input(s): "FREET3" Anemia work up No results for input(s): "VITAMINB12", "FOLATE", "FERRITIN", "TIBC", "IRON", "RETICCTPCT" in the last 72 hours. Urinalysis    Component Value Date/Time   COLORURINE YELLOW (A) 08/07/2023 1737   APPEARANCEUR CLEAR (A) 08/07/2023 1737   APPEARANCEUR Clear 02/19/2013 0254   LABSPEC 1.015 08/07/2023 1737   LABSPEC 1.006 02/19/2013 0254   PHURINE 5.0 08/07/2023 1737   GLUCOSEU NEGATIVE 08/07/2023 1737   GLUCOSEU Negative 02/19/2013 0254   HGBUR NEGATIVE 08/07/2023 1737   BILIRUBINUR NEGATIVE 08/07/2023 1737   BILIRUBINUR Negative 02/19/2013 0254   KETONESUR NEGATIVE 08/07/2023 1737   PROTEINUR >=300 (A) 08/07/2023 1737   NITRITE NEGATIVE 08/07/2023 1737   LEUKOCYTESUR NEGATIVE 08/07/2023 1737   LEUKOCYTESUR 1+ 02/19/2013 0254   Sepsis Labs Recent Labs  Lab 08/12/23 0557 08/13/23 0540 08/15/23 0946  WBC 6.4 4.3 5.1   Microbiology Recent Results (from the past 240 hours)  Culture, blood (Routine x 2)     Status: None   Collection Time: 08/07/23 12:48 PM   Specimen: BLOOD  Result Value Ref Range Status   Specimen Description  Final    BLOOD BLOOD RIGHT ARM Performed at [facility], [address] [place], [place] [address]    Special Requests   Final    BOTTLES DRAWN AEROBIC ONLY Blood Culture results may not be optimal due to an inadequate volume of blood received in culture bottles Performed at [facility], [address], [place], [place] [address]    Culture   Final    NO GROWTH 5 DAYS Performed at [facility], [address] [place], [place] [address]    Report  Status 08/12/2023 FINAL  Final  Resp panel by RT-PCR (RSV, Flu A&B, Covid) Anterior Nasal Swab     Status: None   Collection Time: 08/07/23 12:48 PM   Specimen: Anterior Nasal Swab  Result Value Ref Range Status   SARS Coronavirus 2 by RT PCR NEGATIVE NEGATIVE Final    Comment: (NOTE) SARS-CoV-2 target nucleic acids are NOT DETECTED.  The SARS-CoV-2 RNA is generally detectable in upper respiratory specimens during the acute phase of infection. The lowest concentration of SARS-CoV-2 viral copies this assay can detect is 138 copies/mL. A negative result does not preclude SARS-Cov-2 infection and should not be used as the sole basis for treatment or other patient management decisions. A negative result may occur with  improper specimen collection/handling, submission of specimen other than nasopharyngeal swab, presence of viral mutation(s) within the areas targeted by this assay, and inadequate number of viral copies(<138 copies/mL). A negative result must be combined with clinical observations, patient history, and epidemiological information. The expected result is Negative.  Fact Sheet for Patients:  BloggerCourse.com  Fact Sheet for Healthcare Providers:  SeriousBroker.it  This test is no t yet approved or cleared by the [place] FDA and  has been authorized for detection and/or diagnosis of SARS-CoV-2 by FDA under an Emergency Use Authorization (EUA). This EUA will remain  in effect (meaning this test can be used) for the duration of the COVID-19 declaration under Section 564(b)(1) of the Act, 21 U.S.C.section 360bbb-3(b)(1), unless the authorization is terminated  or revoked sooner.       Influenza A by PCR NEGATIVE NEGATIVE Final   Influenza B by PCR NEGATIVE NEGATIVE Final    Comment: (NOTE) The Xpert Xpress SARS-CoV-2/FLU/RSV plus assay is intended as an aid in the diagnosis of influenza from Nasopharyngeal swab  specimens and should not be used as a sole basis for treatment. Nasal washings and aspirates are unacceptable for Xpert Xpress SARS-CoV-2/FLU/RSV testing.  Fact Sheet for Patients: BloggerCourse.com  Fact Sheet for Healthcare Providers: SeriousBroker.it  This test is not yet approved or cleared by the [place] FDA and has been authorized for detection and/or diagnosis of SARS-CoV-2 by FDA under an Emergency Use Authorization (EUA). This EUA will remain in effect (meaning this test can be used) for the duration of the COVID-19 declaration under Section 564(b)(1) of the Act, 21 U.S.C. section 360bbb-3(b)(1), unless the authorization is terminated or revoked.     Resp Syncytial Virus by PCR NEGATIVE NEGATIVE Final    Comment: (NOTE) Fact Sheet for Patients: BloggerCourse.com  Fact Sheet for Healthcare Providers: SeriousBroker.it  This test is not yet approved or cleared by the [place] FDA and has been authorized for detection and/or diagnosis of SARS-CoV-2 by FDA under an Emergency Use Authorization (EUA). This EUA will remain in effect (meaning this test can be used) for the duration of the COVID-19 declaration under Section 564(b)(1) of the Act, 21 U.S.C. section 360bbb-3(b)(1), unless  the authorization is terminated or revoked.  Performed at [facility], [address] [place], [place] [address]   Culture, blood (Routine x 2)     Status: None   Collection Time: 08/07/23  1:17 PM   Specimen: BLOOD  Result Value Ref Range Status   Specimen Description   Final    BLOOD BLOOD LEFT ARM Performed at [facility], [address] [place], [place] [address]    Special Requests   Final    BOTTLES DRAWN AEROBIC AND ANAEROBIC Blood Culture results may not be optimal due to an inadequate volume of blood received in culture bottles Performed at  [facility], [address], [place], [place] [address]    Culture   Final    NO GROWTH 5 DAYS Performed at [facility], [address] [place], [place] [address]    Report Status 08/12/2023 FINAL  Final  Gastrointestinal Panel by PCR , Stool     Status: None   Collection Time: 08/10/23 10:40 AM   Specimen: Stool  Result Value Ref Range Status   Campylobacter species NOT DETECTED NOT DETECTED Final   Plesimonas shigelloides NOT DETECTED NOT DETECTED Final   Salmonella species NOT DETECTED NOT DETECTED Final   Yersinia enterocolitica NOT DETECTED NOT DETECTED Final   Vibrio species NOT DETECTED NOT DETECTED Final   Vibrio cholerae NOT DETECTED NOT DETECTED Final   Enteroaggregative E coli (EAEC) NOT DETECTED NOT DETECTED Final   Enteropathogenic E coli (EPEC) NOT DETECTED NOT DETECTED Final   Enterotoxigenic E coli (ETEC) NOT DETECTED NOT DETECTED Final   Shiga like toxin producing E coli (STEC) NOT DETECTED NOT DETECTED Final   Shigella/Enteroinvasive E coli (EIEC) NOT DETECTED NOT DETECTED Final   Cryptosporidium NOT DETECTED NOT DETECTED Final   Cyclospora cayetanensis NOT DETECTED NOT DETECTED Final   Entamoeba histolytica NOT DETECTED NOT DETECTED Final   Giardia lamblia NOT DETECTED NOT DETECTED Final   Adenovirus F40/41 NOT DETECTED NOT DETECTED Final   Astrovirus NOT DETECTED NOT DETECTED Final   Norovirus GI/GII NOT DETECTED NOT DETECTED Final   Rotavirus A NOT DETECTED NOT DETECTED Final   Sapovirus (I, II, IV, and V) NOT DETECTED NOT DETECTED Final    Comment: Performed at [facility], [address] [place], [place] [address]     Time coordinating discharge: Over 30 minutes  SIGNED:   Charise Killian, MD  [facility] 08/16/2023, 12:39 PM Pager   If 7PM-7AM, please contact night-coverage [email or web address]

## 2023-08-16 NOTE — TOC Initial Note (Signed)
Transition of Care [facility]) - Initial/Assessment Note    Patient Details  Name: Leslie Parker MRN: [id number] Date of Birth: 1932/12/20  Transition of [facility]) CM/SW Contact:    Chapman Fitch, RN Phone Number: 08/16/2023, 12:43 PM  Clinical Narrative:                    Extreme risk assessment was completed by [facility] 1/16  See note from that admission below  "CSW completed High Risk Readmission assessment w/ pt's Grand daughter, Avon Gully.    Admitted JXB:[id number] Failure  Admitted from: Home  PCP: Dr. Nemiah Commander  [facility] Current [facility] health/prior home health/DME: [facility] w/ [facility]    Pt's Granddaughter would like pt to continue services w/ [facility], once discharged from hospital.    [facility] will continue to follow "  Patient active with [facility] home health for RN, PT, and OT [place] with [facility] notified of discharge for today      Patient Goals and CMS Choice            Expected Discharge Plan and Services         Expected Discharge Date: 08/16/23                                    Prior Living Arrangements/Services                       Activities of Daily Living   ADL Screening (condition at time of admission) Independently performs ADLs?: No Does the patient have a NEW difficulty with bathing/dressing/toileting/self-feeding that is expected to last >3 days?: No Does the patient have a NEW difficulty with getting in/out of bed, walking, or climbing stairs that is expected to last >3 days?: No Does the patient have a NEW difficulty with communication that is expected to last >3 days?: No Is the patient deaf or have difficulty hearing?: No Does the patient have difficulty seeing, even when wearing glasses/contacts?: No Does the patient have difficulty concentrating, remembering, or making decisions?: No  Permission Sought/Granted                  Emotional Assessment              Admission diagnosis:  Ileus  (HCC) [K56.7] Nauseous [R11.0] Abnormal abdominal CT scan [R93.5] Patient Active Problem List   Diagnosis Date Noted   Ileus (HCC) 08/12/2023   Large bowel obstruction (HCC) 08/12/2023   CKD (chronic kidney disease) stage 4, GFR 15-29 ml/min (HCC) 08/12/2023   HFrEF (heart failure with reduced ejection fraction) (HCC) 08/12/2023   Acute combined systolic (congestive) and diastolic (congestive) heart failure (HCC) - LVEF 25% 08/10/2023   Overweight (BMI 25.0-29.9) 05/01/2023   Left hip pain 05/01/2023   Hypokalemia 04/30/2023   Anemia 03/21/2023   Abnormal LFTs 03/20/2023   Type II diabetes mellitus with renal manifestations (HCC) 02/13/2023   Chronic diastolic CHF (congestive heart failure) (HCC) 02/13/2023   CKD stage 3 due to type 2 diabetes mellitus (HCC) 11/24/2022   Paroxysmal atrial fibrillation (HCC) 11/24/2022   Sick sinus syndrome (HCC) 11/15/2022   Acute on chronic diastolic CHF (congestive heart failure) (HCC) 08/27/2022   COPD (chronic obstructive pulmonary disease) (HCC) 08/23/2022   Chronic a-fib (HCC) 08/22/2022   Diabetes mellitus without complication (HCC)    Stage 3b chronic kidney disease (HCC) - Baseline scr 1.8-2.0  Postural dizziness with presyncope    Hypomagnesemia    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 [place] [address] Phone: [phone] Fax: [phone]  [id number] [place] - [address] [place] [address] Phone: [phone] Fax: [phone]     Social Drivers of Health (SDOH) Social History: SDOH Screenings   Food Insecurity: No Food Insecurity (08/12/2023)  Housing: Low Risk  (08/12/2023)  Transportation Needs: No  Transportation Needs (08/12/2023)  Utilities: Not At Risk (08/12/2023)  Financial Resource Strain: Low Risk  (06/06/2023)  Social Connections: Moderately Integrated (08/12/2023)  Tobacco Use: Low Risk  (08/12/2023)   SDOH Interventions:     Readmission Risk Interventions    08/16/2023   12:42 PM 08/08/2023   11:36 AM 03/21/2023    1:12 PM  Readmission Risk Prevention Plan  Transportation Screening Complete Complete Complete  PCP or Specialist Appt within 3-5 Days   Complete  HRI or Home Care Consult   Complete  Social Work Consult for Recovery Care Planning/Counseling   Complete  Palliative Care Screening   Not Applicable  Medication Review Oceanographer) Complete Complete Complete  PCP or Specialist appointment within 3-5 days of discharge  Complete   HRI or Home Care Consult Complete Complete   SW Recovery Care/Counseling Consult Complete Complete   Palliative Care Screening Not Applicable Not Applicable   Skilled Nursing Facility Not Applicable Not Applicable

## 2023-08-16 NOTE — Progress Notes (Signed)
AVS given and reviewed with pt. Medications discussed. All questions answered to satisfaction. Pt verbalized understanding of information given. Pt to be escorted off the unit with all belongings via wheelchair by staff member.

## 2023-08-19 ENCOUNTER — Emergency Department: Payer: Medicare HMO

## 2023-08-19 ENCOUNTER — Inpatient Hospital Stay
Admission: EM | Admit: 2023-08-19 | Discharge: 2023-08-23 | DRG: 291 | Disposition: A | Discharge status: Home Health | Payer: Medicare HMO | Attending: Internal Medicine | Admitting: Internal Medicine

## 2023-08-19 ENCOUNTER — Other Ambulatory Visit: Payer: Self-pay

## 2023-08-19 DIAGNOSIS — Z90710 Acquired absence of both cervix and uterus: Secondary | ICD-10-CM

## 2023-08-19 DIAGNOSIS — I482 Chronic atrial fibrillation, unspecified: Secondary | ICD-10-CM | POA: Diagnosis present

## 2023-08-19 DIAGNOSIS — R109 Unspecified abdominal pain: Secondary | ICD-10-CM | POA: Diagnosis not present

## 2023-08-19 DIAGNOSIS — Z9842 Cataract extraction status, left eye: Secondary | ICD-10-CM

## 2023-08-19 DIAGNOSIS — I495 Sick sinus syndrome: Secondary | ICD-10-CM | POA: Diagnosis present

## 2023-08-19 DIAGNOSIS — I110 Hypertensive heart disease with heart failure: Secondary | ICD-10-CM | POA: Diagnosis not present

## 2023-08-19 DIAGNOSIS — E1129 Type 2 diabetes mellitus with other diabetic kidney complication: Secondary | ICD-10-CM | POA: Diagnosis present

## 2023-08-19 DIAGNOSIS — I5023 Acute on chronic systolic (congestive) heart failure: Secondary | ICD-10-CM | POA: Diagnosis present

## 2023-08-19 DIAGNOSIS — Z961 Presence of intraocular lens: Secondary | ICD-10-CM | POA: Diagnosis present

## 2023-08-19 DIAGNOSIS — E785 Hyperlipidemia, unspecified: Secondary | ICD-10-CM | POA: Diagnosis present

## 2023-08-19 DIAGNOSIS — J90 Pleural effusion, not elsewhere classified: Principal | ICD-10-CM

## 2023-08-19 DIAGNOSIS — J9811 Atelectasis: Secondary | ICD-10-CM | POA: Diagnosis present

## 2023-08-19 DIAGNOSIS — Z9049 Acquired absence of other specified parts of digestive tract: Secondary | ICD-10-CM

## 2023-08-19 DIAGNOSIS — E1122 Type 2 diabetes mellitus with diabetic chronic kidney disease: Secondary | ICD-10-CM | POA: Diagnosis not present

## 2023-08-19 DIAGNOSIS — Z950 Presence of cardiac pacemaker: Secondary | ICD-10-CM

## 2023-08-19 DIAGNOSIS — N184 Chronic kidney disease, stage 4 (severe): Secondary | ICD-10-CM | POA: Diagnosis present

## 2023-08-19 DIAGNOSIS — J4489 Other specified chronic obstructive pulmonary disease: Secondary | ICD-10-CM | POA: Diagnosis present

## 2023-08-19 DIAGNOSIS — I509 Heart failure, unspecified: Secondary | ICD-10-CM | POA: Diagnosis not present

## 2023-08-19 DIAGNOSIS — Z7984 Long term (current) use of oral hypoglycemic drugs: Secondary | ICD-10-CM

## 2023-08-19 DIAGNOSIS — Z79899 Other long term (current) drug therapy: Secondary | ICD-10-CM

## 2023-08-19 DIAGNOSIS — Z8249 Family history of ischemic heart disease and other diseases of the circulatory system: Secondary | ICD-10-CM | POA: Diagnosis not present

## 2023-08-19 DIAGNOSIS — J449 Chronic obstructive pulmonary disease, unspecified: Secondary | ICD-10-CM | POA: Diagnosis not present

## 2023-08-19 DIAGNOSIS — Z888 Allergy status to other drugs, medicaments and biological substances status: Secondary | ICD-10-CM

## 2023-08-19 DIAGNOSIS — I10 Essential (primary) hypertension: Secondary | ICD-10-CM | POA: Diagnosis not present

## 2023-08-19 DIAGNOSIS — E876 Hypokalemia: Secondary | ICD-10-CM | POA: Diagnosis present

## 2023-08-19 DIAGNOSIS — K862 Cyst of pancreas: Secondary | ICD-10-CM | POA: Diagnosis present

## 2023-08-19 DIAGNOSIS — Z6828 Body mass index (BMI) 28.0-28.9, adult: Secondary | ICD-10-CM

## 2023-08-19 DIAGNOSIS — Z7901 Long term (current) use of anticoagulants: Secondary | ICD-10-CM | POA: Diagnosis not present

## 2023-08-19 DIAGNOSIS — Z96642 Presence of left artificial hip joint: Secondary | ICD-10-CM | POA: Diagnosis present

## 2023-08-19 DIAGNOSIS — K8689 Other specified diseases of pancreas: Secondary | ICD-10-CM | POA: Diagnosis not present

## 2023-08-19 DIAGNOSIS — K5730 Diverticulosis of large intestine without perforation or abscess without bleeding: Secondary | ICD-10-CM | POA: Diagnosis not present

## 2023-08-19 DIAGNOSIS — Z825 Family history of asthma and other chronic lower respiratory diseases: Secondary | ICD-10-CM

## 2023-08-19 DIAGNOSIS — Z96611 Presence of right artificial shoulder joint: Secondary | ICD-10-CM | POA: Diagnosis present

## 2023-08-19 DIAGNOSIS — I130 Hypertensive heart and chronic kidney disease with heart failure and stage 1 through stage 4 chronic kidney disease, or unspecified chronic kidney disease: Secondary | ICD-10-CM | POA: Diagnosis not present

## 2023-08-19 DIAGNOSIS — R531 Weakness: Secondary | ICD-10-CM | POA: Diagnosis not present

## 2023-08-19 DIAGNOSIS — E663 Overweight: Secondary | ICD-10-CM | POA: Diagnosis present

## 2023-08-19 DIAGNOSIS — Z96653 Presence of artificial knee joint, bilateral: Secondary | ICD-10-CM | POA: Diagnosis present

## 2023-08-19 DIAGNOSIS — K567 Ileus, unspecified: Secondary | ICD-10-CM | POA: Diagnosis present

## 2023-08-19 LAB — CBC WITH DIFFERENTIAL/PLATELET
Abs Immature Granulocytes: 0.05 10*3/uL (ref 0.00–0.07)
Basophils Absolute: 0 10*3/uL (ref 0.0–0.1)
Basophils Relative: 1 %
Eosinophils Absolute: 0 10*3/uL (ref 0.0–0.5)
Eosinophils Relative: 0 %
HCT: 37.6 % (ref 36.0–46.0)
Hemoglobin: 12.3 g/dL (ref 12.0–15.0)
Immature Granulocytes: 1 %
Lymphocytes Relative: 17 %
Lymphs Abs: 0.9 10*3/uL (ref 0.7–4.0)
MCH: 30.1 pg (ref 26.0–34.0)
MCHC: 32.7 g/dL (ref 30.0–36.0)
MCV: 92.2 fL (ref 80.0–100.0)
Monocytes Absolute: 0.5 10*3/uL (ref 0.1–1.0)
Monocytes Relative: 8 %
Neutro Abs: 4.1 10*3/uL (ref 1.7–7.7)
Neutrophils Relative %: 73 %
Platelets: 256 10*3/uL (ref 150–400)
RBC: 4.08 MIL/uL (ref 3.87–5.11)
RDW: 17.3 % — ABNORMAL HIGH (ref 11.5–15.5)
WBC: 5.5 10*3/uL (ref 4.0–10.5)
nRBC: 0 % (ref 0.0–0.2)

## 2023-08-19 LAB — COMPREHENSIVE METABOLIC PANEL
ALT: 29 U/L (ref 0–44)
AST: 31 U/L (ref 15–41)
Albumin: 2.9 g/dL — ABNORMAL LOW (ref 3.5–5.0)
Alkaline Phosphatase: 95 U/L (ref 38–126)
Anion gap: 11 (ref 5–15)
BUN: 38 mg/dL — ABNORMAL HIGH (ref 8–23)
CO2: 21 mmol/L — ABNORMAL LOW (ref 22–32)
Calcium: 8.4 mg/dL — ABNORMAL LOW (ref 8.9–10.3)
Chloride: 103 mmol/L (ref 98–111)
Creatinine, Ser: 2.37 mg/dL — ABNORMAL HIGH (ref 0.44–1.00)
GFR, Estimated: 19 mL/min — ABNORMAL LOW (ref >60)
Glucose, Bld: 176 mg/dL — ABNORMAL HIGH (ref 70–99)
Potassium: 3.2 mmol/L — ABNORMAL LOW (ref 3.5–5.1)
Sodium: 135 mmol/L (ref 135–145)
Total Bilirubin: 0.7 mg/dL (ref 0.0–1.2)
Total Protein: 5.8 g/dL — ABNORMAL LOW (ref 6.5–8.1)

## 2023-08-19 LAB — URINALYSIS, ROUTINE W REFLEX MICROSCOPIC
Bilirubin Urine: NEGATIVE
Glucose, UA: >=500 mg/dL — AB
Ketones, ur: NEGATIVE mg/dL
Nitrite: NEGATIVE
Protein, ur: 30 mg/dL — AB
Specific Gravity, Urine: 1.005 (ref 1.005–1.030)
pH: 6 (ref 5.0–8.0)

## 2023-08-19 LAB — LIPASE, BLOOD: Lipase: 31 U/L (ref 11–51)

## 2023-08-19 LAB — PHOSPHORUS: Phosphorus: 3 mg/dL (ref 2.5–4.6)

## 2023-08-19 LAB — MAGNESIUM: Magnesium: 1.9 mg/dL (ref 1.7–2.4)

## 2023-08-19 LAB — BRAIN NATRIURETIC PEPTIDE: B Natriuretic Peptide: >4500 pg/mL — ABNORMAL HIGH (ref 0.0–100.0)

## 2023-08-19 MED ORDER — HYDRALAZINE HCL 20 MG/ML IJ SOLN
5.0000 mg | INTRAMUSCULAR | Status: DC | PRN
  Administered 2023-08-20: 5 mg via INTRAVENOUS
  Filled 2023-08-19: qty 1

## 2023-08-19 MED ORDER — VITAMIN B-12 1000 MCG PO TABS
2500.0000 ug | ORAL_TABLET | Freq: Every day | ORAL | Status: DC
  Administered 2023-08-20 – 2023-08-23 (×4): 2500 ug via ORAL
  Filled 2023-08-19: qty 2.5
  Filled 2023-08-19 (×2): qty 3
  Filled 2023-08-19: qty 2.5

## 2023-08-19 MED ORDER — COLESTIPOL HCL 1 G PO TABS
1.0000 g | ORAL_TABLET | Freq: Two times a day (BID) | ORAL | Status: DC
  Administered 2023-08-20 – 2023-08-23 (×7): 1 g via ORAL
  Filled 2023-08-19 (×9): qty 1

## 2023-08-19 MED ORDER — TRAMADOL HCL 50 MG PO TABS: 50.0000 mg | ORAL_TABLET | Freq: Four times a day (QID) | ORAL | Status: DC | PRN

## 2023-08-19 MED ORDER — VITAMIN D 25 MCG (1000 UNIT) PO TABS
1000.0000 [IU] | ORAL_TABLET | Freq: Every day | ORAL | Status: DC
  Administered 2023-08-20 – 2023-08-23 (×4): 1000 [IU] via ORAL
  Filled 2023-08-19 (×4): qty 1

## 2023-08-19 MED ORDER — POTASSIUM CHLORIDE CRYS ER 20 MEQ PO TBCR
40.0000 meq | EXTENDED_RELEASE_TABLET | Freq: Once | ORAL | Status: AC
  Administered 2023-08-19: 40 meq via ORAL
  Filled 2023-08-19: qty 2

## 2023-08-19 MED ORDER — FLUTICASONE FUROATE-VILANTEROL 100-25 MCG/ACT IN AEPB
1.0000 | INHALATION_SPRAY | Freq: Every day | RESPIRATORY_TRACT | Status: DC
  Administered 2023-08-20 – 2023-08-23 (×4): 1 via RESPIRATORY_TRACT
  Filled 2023-08-19: qty 28

## 2023-08-19 MED ORDER — CARVEDILOL 6.25 MG PO TABS
6.2500 mg | ORAL_TABLET | Freq: Two times a day (BID) | ORAL | Status: DC
  Administered 2023-08-20 – 2023-08-23 (×7): 6.25 mg via ORAL
  Filled 2023-08-19 (×7): qty 1

## 2023-08-19 MED ORDER — AMIODARONE HCL 200 MG PO TABS
200.0000 mg | ORAL_TABLET | Freq: Every day | ORAL | Status: DC
  Administered 2023-08-20 – 2023-08-23 (×4): 200 mg via ORAL
  Filled 2023-08-19 (×4): qty 1

## 2023-08-19 MED ORDER — UMECLIDINIUM BROMIDE 62.5 MCG/ACT IN AEPB
1.0000 | INHALATION_SPRAY | Freq: Every day | RESPIRATORY_TRACT | Status: DC
  Administered 2023-08-20 – 2023-08-23 (×4): 1 via RESPIRATORY_TRACT
  Filled 2023-08-19: qty 7

## 2023-08-19 MED ORDER — HYDRALAZINE HCL 50 MG PO TABS
50.0000 mg | ORAL_TABLET | Freq: Three times a day (TID) | ORAL | Status: DC
  Administered 2023-08-20 – 2023-08-23 (×10): 50 mg via ORAL
  Filled 2023-08-19 (×10): qty 1

## 2023-08-19 MED ORDER — ONDANSETRON HCL 4 MG/2ML IJ SOLN: 4.0000 mg | Freq: Three times a day (TID) | INTRAMUSCULAR | Status: DC | PRN

## 2023-08-19 MED ORDER — ADULT MULTIVITAMIN W/MINERALS CH
1.0000 | ORAL_TABLET | Freq: Every day | ORAL | Status: DC
Start: 2023-08-20 — End: 2023-08-23
  Administered 2023-08-20 – 2023-08-23 (×4): 1 via ORAL
  Filled 2023-08-19 (×4): qty 1

## 2023-08-19 MED ORDER — FUROSEMIDE 10 MG/ML IJ SOLN
40.0000 mg | Freq: Once | INTRAMUSCULAR | Status: AC
  Administered 2023-08-19: 40 mg via INTRAVENOUS
  Filled 2023-08-19: qty 4

## 2023-08-19 MED ORDER — ACETAMINOPHEN 325 MG PO TABS: 650.0000 mg | ORAL_TABLET | Freq: Four times a day (QID) | ORAL | Status: DC | PRN

## 2023-08-19 MED ORDER — APIXABAN 2.5 MG PO TABS
2.5000 mg | ORAL_TABLET | Freq: Two times a day (BID) | ORAL | Status: DC
  Administered 2023-08-20 – 2023-08-23 (×8): 2.5 mg via ORAL
  Filled 2023-08-19 (×9): qty 1

## 2023-08-19 MED ORDER — ISOSORBIDE MONONITRATE ER 30 MG PO TB24
30.0000 mg | ORAL_TABLET | Freq: Every day | ORAL | Status: DC
  Administered 2023-08-20 – 2023-08-22 (×4): 30 mg via ORAL
  Filled 2023-08-19 (×4): qty 1

## 2023-08-19 MED ORDER — TRAZODONE HCL 100 MG PO TABS
100.0000 mg | ORAL_TABLET | Freq: Every day | ORAL | Status: DC
  Administered 2023-08-20 – 2023-08-22 (×4): 100 mg via ORAL
  Filled 2023-08-19 (×4): qty 1

## 2023-08-19 MED ORDER — PANTOPRAZOLE SODIUM 40 MG PO TBEC
40.0000 mg | DELAYED_RELEASE_TABLET | Freq: Every day | ORAL | Status: DC
  Administered 2023-08-20 – 2023-08-23 (×4): 40 mg via ORAL
  Filled 2023-08-19 (×4): qty 1

## 2023-08-19 MED ORDER — ALBUTEROL SULFATE (2.5 MG/3ML) 0.083% IN NEBU: 2.5000 mg | INHALATION_SOLUTION | RESPIRATORY_TRACT | Status: DC | PRN

## 2023-08-19 MED ORDER — DM-GUAIFENESIN ER 30-600 MG PO TB12: 1.0000 | ORAL_TABLET | Freq: Two times a day (BID) | ORAL | Status: DC | PRN

## 2023-08-19 NOTE — ED Triage Notes (Signed)
Pt arrives to the ED with generalized sickness. Pt is not able to give a reason or s/s of the sickness.

## 2023-08-19 NOTE — H&P (Incomplete)
History and Physical    [name] IHK:[id number] DOB: 06/13/1933 DOA: 08/19/2023  Referring MD/NP/PA:   PCP: [name], MD   Patient coming from:  The patient is coming from home.     Chief Complaint: SOB  HPI: Leslie Parker is a 89 y.o. female with medical history significant of sCHF with EF 25-30%, SSS (s/p of PPM), A fib on Eliquis, HTN, HLD, DM, COPD, CKD-3b, osteopenia, recent ileus and possible diverticulitis, who presents with SOB  Patient was recently hospitalized from 01/20 - 1/24 due to ileus and possible diverticulitis.  Patient was treated with Zosyn and discharged on course of Augmentin.  Patient states that she finished antibiotic.  She has mild lower abdominal pain, but no nausea, vomiting, diarrhea today.  No fever or chills. Patient has shortness of breath and mild dry cough, no chest pain.  She has worsening bilateral leg edema.  Feeling sick recently with generalized weakness, poor appetite and decreased oral intake.   Data reviewed independently and ED Course: pt was found to have BNP> 4500, WBC 5.5, stable renal function, urinalysis (clear appearance, small amount of leukocyte, rare bacteria, WBC 6-10), temperature normal, blood pressure 170/86, heart rate 75, RR 18, oxygen saturation 97% on room air.  Patient is admitted to telemetry bed as inpatient.  CT of abdomen/pelvis:    EKG: I have personally reviewed.  Not done in ED, will get one.   ***   Review of Systems:   General: no fevers, chills, no body weight gain, has poor appetite, has fatigue HEENT: no blurry vision, hearing changes or sore throat Respiratory: no dyspnea, coughing, wheezing CV: no chest pain, no palpitations GI: no nausea, vomiting, abdominal pain, diarrhea, constipation GU: no dysuria, burning on urination, increased urinary frequency, hematuria  Ext: no leg edema Neuro: no unilateral weakness, numbness, or tingling, no vision change or hearing loss Skin: no rash, no skin  tear. MSK: No muscle spasm, no deformity, no limitation of range of movement in spin Heme: No easy bruising.  Travel history: No recent long distant travel.   Allergy:  Allergies  Allergen Reactions  . Baclofen Other (See Comments)    Elevated Cr  . Simvastatin Other (See Comments)    Pt denies  Elevated LFTs with simva 40mg , stopped and resolved (see MD note 11/10/13)    Past Medical History:  Diagnosis Date  . Acute kidney injury superimposed on CKD (HCC) 11/24/2022  . Asthma   . Atrial fibrillation with RVR (HCC)   . Cancer (HCC)    Basal Cell  . Closed left hip fracture (HCC) 01/25/2017  . Closed right hip fracture (HCC) 02/13/2023  . Diabetes mellitus without complication (HCC)   . Femur fracture, left (HCC) 02/03/2015  . Heart murmur   . Hypertension   . Impetigo   . Osteoarthritis   . Osteopenia   . 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  . Rapid atrial fibrillation, new onset(HCC) 06/05/2022  . Vomiting    can not due to surgery  . Wears dentures    full upper and lower    Past Surgical History:  Procedure Laterality Date  . ABDOMINAL HYSTERECTOMY    . BACK SURGERY    . BLADDER SURGERY     mesh  . CARPAL TUNNEL RELEASE Bilateral   . CATARACT EXTRACTION Right 2017  . CATARACT EXTRACTION W/PHACO Left 02/06/2016   Procedure: CATARACT EXTRACTION PHACO AND INTRAOCULAR LENS PLACEMENT (IOC) left eye;  Surgeon: Sherald Hess, MD;  Location: [facility];  Service: Ophthalmology;  Laterality: Left;  DIABETIC LEFT Cannot arrive before 9:30  . CERVICAL DISC SURGERY    . CHOLECYSTECTOMY    . EYE SURGERY Bilateral    Cataract Extraction with IOL  . FEMUR IM NAIL Left 02/04/2015   Procedure: INTRAMEDULLARY (IM) NAIL FEMORAL;  Surgeon: Juanell Fairly, MD;  Location: [facility];  Service: Orthopedics;  Laterality: Left;  . HARDWARE REMOVAL Left 01/17/2017   Procedure: HARDWARE REMOVAL;  Surgeon: Juanell Fairly, MD;  Location: [facility];  Service:  Orthopedics;  Laterality: Left;  . HERNIA REPAIR  2014   esophageal and gastric mesh. patient unable to throw up d/t mesh  . HIP ARTHROPLASTY Left 01/26/2017   Procedure: ARTHROPLASTY BIPOLAR HIP (HEMIARTHROPLASTY) removal hardware left hip;  Surgeon: Deeann Saint, MD;  Location: [facility];  Service: Orthopedics;  Laterality: Left;  . INTRAMEDULLARY (IM) NAIL INTERTROCHANTERIC Right 02/13/2023   Procedure: INTRAMEDULLARY (IM) NAIL INTERTROCHANTERIC;  Surgeon: Christena Flake, MD;  Location: [facility];  Service: Orthopedics;  Laterality: Right;  . JOINT REPLACEMENT Bilateral    knees  . PACEMAKER IMPLANT N/A 11/15/2022   Procedure: PACEMAKER IMPLANT;  Surgeon: Marcina Millard, MD;  Location: [facility];  Service: Cardiovascular;  Laterality: N/A;  . PACEMAKER IMPLANT N/A 11/28/2022   Procedure: PACEMAKER IMPLANT;  Surgeon: Marcina Millard, MD;  Location: [facility];  Service: Cardiovascular;  Laterality: N/A;  Lead reposition  . REPLACEMENT TOTAL KNEE BILATERAL Bilateral [id number]  . SHOULDER ARTHROSCOPY WITH OPEN ROTATOR CUFF REPAIR AND DISTAL CLAVICLE ACROMINECTOMY Left 10/25/2016   Procedure: SHOULDER ARTHROSCOPY WITH OPEN ROTATOR CUFF REPAIR AND DISTAL CLAVICLE ACROMINECTOMY;  Surgeon: Juanell Fairly, MD;  Location: [facility];  Service: Orthopedics;  Laterality: Left;  . THYROID SURGERY     goiter removed  . TOTAL HIP REVISION Left 12/02/2017   Procedure: TOTAL HIP REVISION;  Surgeon: Lyndle Herrlich, MD;  Location: [facility];  Service: Orthopedics;  Laterality: Left;  . TOTAL SHOULDER REPLACEMENT Right 2012    Social History:  reports that she has never smoked. She has never used smokeless tobacco. She reports that she does not drink alcohol and does not use drugs.  Family History:  Family History  Problem Relation Age of Onset  . Heart failure Mother   . Hypertension Mother   . Emphysema Father      Prior to Admission medications   Medication Sig Start Date  End Date Taking? Authorizing Provider  albuterol (VENTOLIN HFA) 108 (90 Base) MCG/ACT inhaler Inhale 2 puffs into the lungs every 6 (six) hours as needed. 08/04/19   [provider]  amiodarone (PACERONE) 200 MG tablet Take 200 mg by mouth daily.    [provider]  amoxicillin-clavulanate (AUGMENTIN) 500-125 MG tablet Take 1 tablet by mouth 2 (two) times daily for 3 days. 08/16/23 08/19/23  Charise Killian, MD  apixaban (ELIQUIS) 2.5 MG TABS tablet Take 1 tablet (2.5 mg total) by mouth 2 (two) times daily. 06/07/22   Tresa Moore, MD  ascorbic acid (VITAMIN C) 250 MG CHEW Chew 250 mg by mouth daily.    [provider]  carvedilol (COREG) 6.25 MG tablet Take 1 tablet (6.25 mg total) by mouth 2 (two) times daily with a meal. 08/11/23 11/09/23  Carollee Herter, DO  Cholecalciferol (VITAMIN D-1000 MAX ST) 25 MCG (1000 UT) tablet Take 1,000 Units by mouth daily.    [provider]  colestipol (COLESTID) 1 g tablet Take 1 tablet by mouth 2 (  two) times daily. 06/01/22   [provider]  Cyanocobalamin (B-12) 2500 MCG TABS Take 2,500 mcg by mouth daily.    [provider]  empagliflozin (JARDIANCE) 10 MG TABS tablet Take 1 tablet (10 mg total) by mouth daily. 08/12/23 11/10/23  Carollee Herter, DO  furosemide (LASIX) 40 MG tablet Take 1 tablet (40 mg total) by mouth in the morning. 08/11/23 11/09/23  Carollee Herter, DO  hydrALAZINE (APRESOLINE) 50 MG tablet Take 1 tablet (50 mg total) by mouth 3 (three) times daily. 08/11/23 11/09/23  Carollee Herter, DO  ipratropium-albuterol (DUONEB) 0.5-2.5 (3) MG/3ML SOLN Inhale 3 mLs into the lungs every 6 (six) hours as needed. 06/01/22   [provider]  isosorbide mononitrate (IMDUR) 30 MG 24 hr tablet Take 1 tablet (30 mg total) by mouth at bedtime. 08/11/23 11/09/23  Carollee Herter, DO  Magnesium Oxide 500 MG CAPS Take 500 mg by mouth daily.    [provider]  Multiple Vitamin (MULTIVITAMIN WITH MINERALS) TABS tablet  Take 1 tablet by mouth daily. One-A-Day Women's Vitamin    [provider]  omeprazole (PRILOSEC) 20 MG capsule Take 20 mg by mouth every morning. 01/10/23   [provider]  ondansetron (ZOFRAN-ODT) 4 MG disintegrating tablet Take 4 mg by mouth every 8 (eight) hours as needed. 05/21/23   [provider]  traMADol (ULTRAM) 50 MG tablet Take 1 tablet by mouth every 6 (six) hours as needed. 07/01/23   [provider]  traZODone (DESYREL) 100 MG tablet Take 100 mg by mouth at bedtime.    [provider]  TRELEGY ELLIPTA 100-62.5-25 MCG/ACT AEPB Inhale 1 puff into the lungs daily. 08/03/22   [provider]    Physical Exam: Vitals:   08/19/23 1550 08/19/23 1555 08/19/23 2017 08/19/23 2111  BP: (!) 158/76  (!) 170/86 (!) 168/67  Pulse: 75  72 69  Resp: 18  18 16   Temp: 97.9 F (36.6 C)  98 F (36.7 C) 98 F (36.7 C)  TempSrc: Oral  Oral Oral  SpO2: 95%  95% 97%  Weight:  88.2 kg    Height:  5\' 9"  (1.753 m)     General: Not in acute distress HEENT:       Eyes: PERRL, EOMI, no jaundice       ENT: No discharge from the ears and nose, no pharynx injection, no tonsillar enlargement.        Neck: No JVD, no bruit, no mass felt. Heme: No neck lymph node enlargement. Cardiac: S1/S2, RRR, No murmurs, No gallops or rubs. Respiratory: No rales, wheezing, rhonchi or rubs. GI: Soft, nondistended, nontender, no rebound pain, no organomegaly, BS present. GU: No hematuria Ext: No pitting leg edema bilaterally. 1+DP/PT pulse bilaterally. Musculoskeletal: No joint deformities, No joint redness or warmth, no limitation of ROM in spin. Skin: No rashes.  Neuro: Alert, oriented X3, cranial nerves II-XII grossly intact, moves all extremities normally. Muscle strength 5/5 in all extremities, sensation to light touch intact. Brachial reflex 2+ bilaterally. Knee reflex 1+ bilaterally. Negative Babinski's sign. Normal finger to nose test. Psych: Patient is not  psychotic, no suicidal or hemocidal ideation.  Labs on Admission: I have personally reviewed following labs and imaging studies  CBC: Recent Labs  Lab 08/13/23 0540 08/15/23 0946 08/19/23 1557  WBC 4.3 5.1 5.5  NEUTROABS  --   --  4.1  HGB 10.4* 11.0* 12.3  HCT 30.9* 32.9* 37.6  MCV 90.9 91.4 92.2  PLT 173 175 256  Basic Metabolic Panel: Recent Labs  Lab 08/13/23 0540 08/14/23 0629 08/15/23 0946 08/16/23 0904 08/19/23 1557  NA 131* 131* 132* 135 135  K 3.1* 4.3 4.1 3.7 3.2*  CL 100 100 100 104 103  CO2 22 22 22  21* 21*  GLUCOSE 137* 115* 110* 91 176*  BUN 35* 36* 39* 39* 38*  CREATININE 2.07* 2.60* 2.92* 2.73* 2.37*  CALCIUM 7.6* 7.8* 8.0* 7.8* 8.4*  MG 2.1  --   --   --   --    GFR: Estimated Creatinine Clearance: 19.4 mL/min (A) (by C-G formula based on SCr of 2.37 mg/dL (H)). Liver Function Tests: Recent Labs  Lab 08/13/23 0540 08/19/23 1557  AST 23 31  ALT 22 29  ALKPHOS 57 95  BILITOT 1.0 0.7  PROT 4.6* 5.8*  ALBUMIN 2.3* 2.9*   Recent Labs  Lab 08/19/23 1557  LIPASE 31   No results for input(s): "AMMONIA" in the last 168 hours. Coagulation Profile: No results for input(s): "INR", "PROTIME" in the last 168 hours. Cardiac Enzymes: No results for input(s): "CKTOTAL", "CKMB", "CKMBINDEX", "TROPONINI" in the last 168 hours. BNP (last 3 results) No results for input(s): "PROBNP" in the last 8760 hours. HbA1C: No results for input(s): "HGBA1C" in the last 72 hours. CBG: No results for input(s): "GLUCAP" in the last 168 hours. Lipid Profile: No results for input(s): "CHOL", "HDL", "LDLCALC", "TRIG", "CHOLHDL", "LDLDIRECT" in the last 72 hours. Thyroid Function Tests: No results for input(s): "TSH", "T4TOTAL", "FREET4", "T3FREE", "THYROIDAB" in the last 72 hours. Anemia Panel: No results for input(s): "VITAMINB12", "FOLATE", "FERRITIN", "TIBC", "IRON", "RETICCTPCT" in the last 72 hours. Urine analysis:    Component Value Date/Time   COLORURINE  YELLOW (A) 08/07/2023 1737   APPEARANCEUR CLEAR (A) 08/07/2023 1737   APPEARANCEUR Clear 02/19/2013 0254   LABSPEC 1.015 08/07/2023 1737   LABSPEC 1.006 02/19/2013 0254   PHURINE 5.0 08/07/2023 1737   GLUCOSEU NEGATIVE 08/07/2023 1737   GLUCOSEU Negative 02/19/2013 0254   HGBUR NEGATIVE 08/07/2023 1737   BILIRUBINUR NEGATIVE 08/07/2023 1737   BILIRUBINUR Negative 02/19/2013 0254   KETONESUR NEGATIVE 08/07/2023 1737   PROTEINUR >=300 (A) 08/07/2023 1737   NITRITE NEGATIVE 08/07/2023 1737   LEUKOCYTESUR NEGATIVE 08/07/2023 1737   LEUKOCYTESUR 1+ 02/19/2013 0254   Sepsis Labs: @LABRCNTIP (procalcitonin:4,lacticidven:4) ) Recent Results (from the past 240 hours)  Gastrointestinal Panel by PCR , Stool     Status: None   Collection Time: 08/10/23 10:40 AM   Specimen: Stool  Result Value Ref Range Status   Campylobacter species NOT DETECTED NOT DETECTED Final   Plesimonas shigelloides NOT DETECTED NOT DETECTED Final   Salmonella species NOT DETECTED NOT DETECTED Final   Yersinia enterocolitica NOT DETECTED NOT DETECTED Final   Vibrio species NOT DETECTED NOT DETECTED Final   Vibrio cholerae NOT DETECTED NOT DETECTED Final   Enteroaggregative E coli (EAEC) NOT DETECTED NOT DETECTED Final   Enteropathogenic E coli (EPEC) NOT DETECTED NOT DETECTED Final   Enterotoxigenic E coli (ETEC) NOT DETECTED NOT DETECTED Final   Shiga like toxin producing E coli (STEC) NOT DETECTED NOT DETECTED Final   Shigella/Enteroinvasive E coli (EIEC) NOT DETECTED NOT DETECTED Final   Cryptosporidium NOT DETECTED NOT DETECTED Final   Cyclospora cayetanensis NOT DETECTED NOT DETECTED Final   Entamoeba histolytica NOT DETECTED NOT DETECTED Final   Giardia lamblia NOT DETECTED NOT DETECTED Final   Adenovirus F40/41 NOT DETECTED NOT DETECTED Final   Astrovirus NOT DETECTED NOT DETECTED Final   Norovirus GI/GII NOT  DETECTED NOT DETECTED Final   Rotavirus A NOT DETECTED NOT DETECTED Final   Sapovirus (I, II, IV,  and V) NOT DETECTED NOT DETECTED Final    Comment: Performed at [facility], [address] [place], [place] [address]     Radiological Exams on Admission:   Assessment/Plan Active Problems:   * No active hospital problems. *   Assessment and Plan: No notes have been filed under this hospital service. Service: Hospitalist      Active Problems:   * No active hospital problems. *    DVT ppx: SQ Heparin         SQ Lovenox  Code Status: Full code   ***  Family Communication:     not done, no family member is at bed side.              Yes, patient's    at bed side.       by phone   ***  Disposition Plan:  Anticipate discharge back to previous environment  Consults called:    Admission status and Level of care: :    for obs as inpt        Dispo: The patient is from: {From:23814}              Anticipated d/c is to: {To:23815}              Anticipated d/c date is: {Days:23816}              Patient currently {Medically stable:23817}    Severity of Illness:  {Observation/Inpatient:21159}       Date of Service 08/19/2023    Lorretta Harp [facility]   If 7PM-7AM, please contact night-coverage [email or web address] 08/19/2023, 10:23 PM

## 2023-08-19 NOTE — ED Provider Notes (Signed)
[facility] Provider Note    Event Date/Time   First MD Initiated Contact with Patient 08/19/23 1954     (approximate)   History   Abdominal Pain   HPI  Leslie Parker is a 89 y.o. female who presents with complaints of "not feeling well ".  She states she feels somewhat nauseated, she knows she was recently in the hospital and had been feeling better but symptoms have returned.  Review of records demonstrate the patient was discharged on the 24th, had likely ileus which improved     Physical Exam   Triage Vital Signs: ED Triage Vitals  Encounter Vitals Group     BP 08/19/23 1550 (!) 158/76     Systolic BP Percentile --      Diastolic BP Percentile --      Pulse Rate 08/19/23 1550 75     Resp 08/19/23 1550 18     Temp 08/19/23 1550 97.9 F (36.6 C)     Temp Source 08/19/23 1550 Oral     SpO2 08/19/23 1550 95 %     Weight 08/19/23 1555 88.2 kg (194 lb 7.1 oz)     Height 08/19/23 1555 1.753 m (5\' 9" )     Head Circumference --      Peak Flow --      Pain Score 08/19/23 1555 0     Pain Loc --      Pain Education --      Exclude from Growth Chart --     Most recent vital signs: Vitals:   08/19/23 2017 08/19/23 2111  BP: (!) 170/86 (!) 168/67  Pulse: 72 69  Resp: 18 16  Temp: 98 F (36.7 C) 98 F (36.7 C)  SpO2: 95% 97%     General: Awake, no distress.  CV:  Good peripheral perfusion.  Resp:  Normal effort.  Abd:  Mild distention, no significant tenderness to palpation Other:  Lower extremity edema 2+ bilaterally   ED Results / Procedures / Treatments   Labs (all labs ordered are listed, but only abnormal results are displayed) Labs Reviewed  COMPREHENSIVE METABOLIC PANEL - Abnormal; Notable for the following components:      Result Value   Potassium 3.2 (*)    CO2 21 (*)    Glucose, Bld 176 (*)    BUN 38 (*)    Creatinine, Ser 2.37 (*)    Calcium 8.4 (*)    Total Protein 5.8 (*)    Albumin 2.9 (*)    GFR, Estimated 19  (*)    All other components within normal limits  CBC WITH DIFFERENTIAL/PLATELET - Abnormal; Notable for the following components:   RDW 17.3 (*)    All other components within normal limits  LIPASE, BLOOD  URINALYSIS, ROUTINE W REFLEX MICROSCOPIC     EKG     RADIOLOGY CT scan demonstrates improved ileus however she has large pleural effusions    PROCEDURES:  Critical Care performed:   Procedures   MEDICATIONS ORDERED IN ED: Medications  furosemide (LASIX) injection 40 mg (has no administration in time range)     IMPRESSION / MDM / ASSESSMENT AND PLAN / ED COURSE  I reviewed the triage vital signs and the nursing notes. Patient's presentation is most consistent with acute presentation with potential threat to life or bodily function.  Patient presents with abdominal distention as above also some shortness of breath, recent admission for similar.  Differential includes ileus, SBO,  Will obtain  CT abdomen pelvis to compare to prior  ----------------------------------------- 10:17 PM on 08/19/2023 ----------------------------------------- CT scan demonstrates improved ileus however large pleural effusions combined with significant lower extremity edema and increased work of breathing, at rest her oxygen saturations are in the low 90s.  Will start IV Lasix, will require admission to the hospitalist for diuresis      FINAL CLINICAL IMPRESSION(S) / ED DIAGNOSES   Final diagnoses:  Pleural effusion, bilateral  Acute on chronic congestive heart failure, unspecified heart failure type (HCC)     Rx / DC Orders   ED Discharge Orders     None        Note:  This document was prepared using Dragon voice recognition software and may include unintentional dictation errors.   Jene Every, MD 08/19/23 2218

## 2023-08-19 NOTE — H&P (Signed)
History and Physical    CELESTINE PRIM WUJ:[id number] DOB: 12-26-1932 DOA: 08/19/2023  Referring MD/NP/PA:   PCP: [name], MD   Patient coming from:  The patient is coming from home.     Chief Complaint: SOB  HPI: Leslie Parker is a 89 y.o. female with medical history significant of sCHF with EF 25-30%, SSS (s/p of PPM), A fib on Eliquis, HTN, HLD, DM, COPD, CKD-3b, osteopenia, recent ileus and possible diverticulitis, who presents with SOB  Patient was recently hospitalized from 01/20 - 1/24 due to ileus and possible diverticulitis.  Patient was treated with Zosyn and discharged on course of Augmentin.  Patient states that she finished antibiotic.  She has mild lower abdominal pain, but no nausea, vomiting, diarrhea today.  No fever or chills. Patient has shortness of breath and mild dry cough, no chest pain.  She has worsening bilateral leg edema.  Feeling sick recently with generalized weakness, poor appetite and decreased oral intake.   Data reviewed independently and ED Course: pt was found to have BNP> 4500, WBC 5.5, stable renal function, urinalysis (clear appearance, small amount of leukocyte, rare bacteria, WBC 6-10), temperature normal, blood pressure 170/86, heart rate 75, RR 18, oxygen saturation 97% on room air.  Patient is admitted to telemetry bed as inpatient.  CT of abdomen/pelvis: Large bilateral pleural effusions with lower lobe atelectasis. Stable peripancreatic cyst. Follow-up can be performed as previously described. Diverticulosis without diverticulitis. The degree of colonic dilatation has improved in the interval.  EKG: I have personally reviewed.  Sinus rhythm, QTc 449, LAD, poor R wave progression, left bundle blockage, anteroseptal infarction pattern   Review of Systems:   General: no fevers, chills, no body weight gain, has poor appetite, has fatigue HEENT: no blurry vision, hearing changes or sore throat Respiratory: has dyspnea, coughing, no  wheezing CV: has chest pain, no palpitations GI: no nausea, vomiting, has lower abdominal pain, no diarrhea, constipation GU: no dysuria, burning on urination, increased urinary frequency, hematuria  Ext: has leg edema Neuro: no unilateral weakness, numbness, or tingling, no vision change or hearing loss Skin: no rash, no skin tear. MSK: No muscle spasm, no deformity, no limitation of range of movement in spin Heme: No easy bruising.  Travel history: No recent long distant travel.   Allergy:  Allergies  Allergen Reactions   Baclofen Other (See Comments)    Elevated Cr   Simvastatin Other (See Comments)    Pt denies  Elevated LFTs with simva 40mg , stopped and resolved (see MD note 11/10/13)    Past Medical History:  Diagnosis Date   Acute kidney injury superimposed on CKD (HCC) 11/24/2022   Asthma    Atrial fibrillation with RVR (HCC)    Cancer (HCC)    Basal Cell   Closed left hip fracture (HCC) 01/25/2017   Closed right hip fracture (HCC) 02/13/2023   Diabetes mellitus without complication (HCC)    Femur fracture, left (HCC) 02/03/2015   Heart murmur    Hypertension    Impetigo    Osteoarthritis    Osteopenia    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   Rapid atrial fibrillation, new onset(HCC) 06/05/2022   Vomiting    can not due to surgery   Wears dentures    full upper and lower    Past Surgical History:  Procedure Laterality Date   ABDOMINAL HYSTERECTOMY     BACK SURGERY     BLADDER SURGERY     mesh   CARPAL TUNNEL  RELEASE Bilateral    CATARACT EXTRACTION Right 2017   CATARACT EXTRACTION W/PHACO Left 02/06/2016   Procedure: CATARACT EXTRACTION PHACO AND INTRAOCULAR LENS PLACEMENT (IOC) left eye;  Surgeon: Sherald Hess, MD;  Location: [facility];  Service: Ophthalmology;  Laterality: Left;  DIABETIC LEFT Cannot arrive before 9:30   CERVICAL DISC SURGERY     CHOLECYSTECTOMY     EYE SURGERY Bilateral    Cataract Extraction with IOL    FEMUR IM NAIL Left 02/04/2015   Procedure: INTRAMEDULLARY (IM) NAIL FEMORAL;  Surgeon: Juanell Fairly, MD;  Location: [facility];  Service: Orthopedics;  Laterality: Left;   HARDWARE REMOVAL Left 01/17/2017   Procedure: HARDWARE REMOVAL;  Surgeon: Juanell Fairly, MD;  Location: [facility];  Service: Orthopedics;  Laterality: Left;   HERNIA REPAIR  2014   esophageal and gastric mesh. patient unable to throw up d/t mesh   HIP ARTHROPLASTY Left 01/26/2017   Procedure: ARTHROPLASTY BIPOLAR HIP (HEMIARTHROPLASTY) removal hardware left hip;  Surgeon: Deeann Saint, MD;  Location: [facility];  Service: Orthopedics;  Laterality: Left;   INTRAMEDULLARY (IM) NAIL INTERTROCHANTERIC Right 02/13/2023   Procedure: INTRAMEDULLARY (IM) NAIL INTERTROCHANTERIC;  Surgeon: Christena Flake, MD;  Location: [facility];  Service: Orthopedics;  Laterality: Right;   JOINT REPLACEMENT Bilateral    knees   PACEMAKER IMPLANT N/A 11/15/2022   Procedure: PACEMAKER IMPLANT;  Surgeon: Marcina Millard, MD;  Location: [facility];  Service: Cardiovascular;  Laterality: N/A;   PACEMAKER IMPLANT N/A 11/28/2022   Procedure: PACEMAKER IMPLANT;  Surgeon: Marcina Millard, MD;  Location: [facility];  Service: Cardiovascular;  Laterality: N/A;  Lead reposition   REPLACEMENT TOTAL KNEE BILATERAL Bilateral [id number]   SHOULDER ARTHROSCOPY WITH OPEN ROTATOR CUFF REPAIR AND DISTAL CLAVICLE ACROMINECTOMY Left 10/25/2016   Procedure: SHOULDER ARTHROSCOPY WITH OPEN ROTATOR CUFF REPAIR AND DISTAL CLAVICLE ACROMINECTOMY;  Surgeon: Juanell Fairly, MD;  Location: [facility];  Service: Orthopedics;  Laterality: Left;   THYROID SURGERY     goiter removed   TOTAL HIP REVISION Left 12/02/2017   Procedure: TOTAL HIP REVISION;  Surgeon: Lyndle Herrlich, MD;  Location: [facility];  Service: Orthopedics;  Laterality: Left;   TOTAL SHOULDER REPLACEMENT Right 2012    Social History:  reports that she has never smoked. She has never used  smokeless tobacco. She reports that she does not drink alcohol and does not use drugs.  Family History:  Family History  Problem Relation Age of Onset   Heart failure Mother    Hypertension Mother    Emphysema Father      Prior to Admission medications   Medication Sig Start Date End Date Taking? Authorizing Provider  albuterol (VENTOLIN HFA) 108 (90 Base) MCG/ACT inhaler Inhale 2 puffs into the lungs every 6 (six) hours as needed. 08/04/19   [provider]  amiodarone (PACERONE) 200 MG tablet Take 200 mg by mouth daily.    [provider]  amoxicillin-clavulanate (AUGMENTIN) 500-125 MG tablet Take 1 tablet by mouth 2 (two) times daily for 3 days. 08/16/23 08/19/23  Charise Killian, MD  apixaban (ELIQUIS) 2.5 MG TABS tablet Take 1 tablet (2.5 mg total) by mouth 2 (two) times daily. 06/07/22   Tresa Moore, MD  ascorbic acid (VITAMIN C) 250 MG CHEW Chew 250 mg by mouth daily.    [provider]  carvedilol (COREG) 6.25 MG tablet Take 1 tablet (6.25 mg total) by mouth 2 (two) times daily with a meal. 08/11/23 11/09/23  Carollee Herter, DO  Cholecalciferol (VITAMIN D-1000 MAX ST) 25 MCG (1000 UT) tablet Take 1,000 Units by mouth daily.    [provider]  colestipol (COLESTID) 1 g tablet Take 1 tablet by mouth 2 (two) times daily. 06/01/22   [provider]  Cyanocobalamin (B-12) 2500 MCG TABS Take 2,500 mcg by mouth daily.    [provider]  empagliflozin (JARDIANCE) 10 MG TABS tablet Take 1 tablet (10 mg total) by mouth daily. 08/12/23 11/10/23  Carollee Herter, DO  furosemide (LASIX) 40 MG tablet Take 1 tablet (40 mg total) by mouth in the morning. 08/11/23 11/09/23  Carollee Herter, DO  hydrALAZINE (APRESOLINE) 50 MG tablet Take 1 tablet (50 mg total) by mouth 3 (three) times daily. 08/11/23 11/09/23  Carollee Herter, DO  ipratropium-albuterol (DUONEB) 0.5-2.5 (3) MG/3ML SOLN Inhale 3 mLs into the lungs every 6 (six) hours as needed. 06/01/22   [provider]  isosorbide mononitrate (IMDUR) 30 MG 24 hr tablet Take 1 tablet (30 mg total) by mouth at bedtime. 08/11/23 11/09/23  Carollee Herter, DO  Magnesium Oxide 500 MG CAPS Take 500 mg by mouth daily.    [provider]  Multiple Vitamin (MULTIVITAMIN WITH MINERALS) TABS tablet Take 1 tablet by mouth daily. One-A-Day Women's Vitamin    [provider]  omeprazole (PRILOSEC) 20 MG capsule Take 20 mg by mouth every morning. 01/10/23   [provider]  ondansetron (ZOFRAN-ODT) 4 MG disintegrating tablet Take 4 mg by mouth every 8 (eight) hours as needed. 05/21/23   [provider]  traMADol (ULTRAM) 50 MG tablet Take 1 tablet by mouth every 6 (six) hours as needed. 07/01/23   [provider]  traZODone (DESYREL) 100 MG tablet Take 100 mg by mouth at bedtime.    [provider]  TRELEGY ELLIPTA 100-62.5-25 MCG/ACT AEPB Inhale 1 puff into the lungs daily. 08/03/22   [provider]    Physical Exam: Vitals:   08/19/23 1555 08/19/23 2017 08/19/23 2111 08/19/23 2300  BP:  (!) 170/86 (!) 168/67 (!) 167/87  Pulse:  72 69 71  Resp:  18 16   Temp:  98 F (36.7 C) 98 F (36.7 C)   TempSrc:  Oral Oral   SpO2:  95% 97% 97%  Weight: 88.2 kg     Height: 5\' 9"  (1.753 m)      General: Not in acute distress HEENT:       Eyes: PERRL, EOMI, no jaundice       ENT: No discharge from the ears and nose, no pharynx injection, no tonsillar enlargement.        Neck: positive JVD, no bruit, no mass felt. Heme: No neck lymph node enlargement. Cardiac: S1/S2, RRR,  No gallops or rubs. Respiratory: Has fine crackles bilaterally GI: Soft, nondistended, nontender, no rebound pain, no organomegaly, BS present. GU: No hematuria Ext: 3+ pitting leg edema bilaterally. 1+DP/PT pulse bilaterally. Musculoskeletal: No joint deformities, No joint redness or warmth, no limitation of ROM in spin. Skin: No rashes.  Neuro: Alert, oriented X3, cranial nerves  II-XII grossly intact, moves all extremities normally.   Psych: Patient is not psychotic, no suicidal or hemocidal ideation.  Labs on Admission: I have personally reviewed following labs and imaging studies  CBC: Recent Labs  Lab 08/13/23 0540 08/15/23 0946 08/19/23 1557  WBC 4.3 5.1 5.5  NEUTROABS  --   --  4.1  HGB 10.4* 11.0* 12.3  HCT 30.9* 32.9* 37.6  MCV 90.9  91.4 92.2  PLT 173 175 256   Basic Metabolic Panel: Recent Labs  Lab 08/13/23 0540 08/14/23 0629 08/15/23 0946 08/16/23 0904 08/19/23 1557  NA 131* 131* 132* 135 135  K 3.1* 4.3 4.1 3.7 3.2*  CL 100 100 100 104 103  CO2 22 22 22  21* 21*  GLUCOSE 137* 115* 110* 91 176*  BUN 35* 36* 39* 39* 38*  CREATININE 2.07* 2.60* 2.92* 2.73* 2.37*  CALCIUM 7.6* 7.8* 8.0* 7.8* 8.4*  MG 2.1  --   --   --  1.9  PHOS  --   --   --   --  3.0   GFR: Estimated Creatinine Clearance: 19.4 mL/min (A) (by C-G formula based on SCr of 2.37 mg/dL (H)). Liver Function Tests: Recent Labs  Lab 08/13/23 0540 08/19/23 1557  AST 23 31  ALT 22 29  ALKPHOS 57 95  BILITOT 1.0 0.7  PROT 4.6* 5.8*  ALBUMIN 2.3* 2.9*   Recent Labs  Lab 08/19/23 1557  LIPASE 31   No results for input(s): "AMMONIA" in the last 168 hours. Coagulation Profile: No results for input(s): "INR", "PROTIME" in the last 168 hours. Cardiac Enzymes: No results for input(s): "CKTOTAL", "CKMB", "CKMBINDEX", "TROPONINI" in the last 168 hours. BNP (last 3 results) No results for input(s): "PROBNP" in the last 8760 hours. HbA1C: No results for input(s): "HGBA1C" in the last 72 hours. CBG: No results for input(s): "GLUCAP" in the last 168 hours. Lipid Profile: No results for input(s): "CHOL", "HDL", "LDLCALC", "TRIG", "CHOLHDL", "LDLDIRECT" in the last 72 hours. Thyroid Function Tests: No results for input(s): "TSH", "T4TOTAL", "FREET4", "T3FREE", "THYROIDAB" in the last 72 hours. Anemia Panel: No results for input(s): "VITAMINB12", "FOLATE", "FERRITIN",  "TIBC", "IRON", "RETICCTPCT" in the last 72 hours. Urine analysis:    Component Value Date/Time   COLORURINE YELLOW (A) 08/19/2023 2256   APPEARANCEUR CLEAR (A) 08/19/2023 2256   APPEARANCEUR Clear 02/19/2013 0254   LABSPEC 1.005 08/19/2023 2256   LABSPEC 1.006 02/19/2013 0254   PHURINE 6.0 08/19/2023 2256   GLUCOSEU >=500 (A) 08/19/2023 2256   GLUCOSEU Negative 02/19/2013 0254   HGBUR SMALL (A) 08/19/2023 2256   BILIRUBINUR NEGATIVE 08/19/2023 2256   BILIRUBINUR Negative 02/19/2013 0254   KETONESUR NEGATIVE 08/19/2023 2256   PROTEINUR 30 (A) 08/19/2023 2256   NITRITE NEGATIVE 08/19/2023 2256   LEUKOCYTESUR SMALL (A) 08/19/2023 2256   LEUKOCYTESUR 1+ 02/19/2013 0254   Sepsis Labs: @LABRCNTIP (procalcitonin:4,lacticidven:4) ) Recent Results (from the past 240 hours)  Gastrointestinal Panel by PCR , Stool     Status: None   Collection Time: 08/10/23 10:40 AM   Specimen: Stool  Result Value Ref Range Status   Campylobacter species NOT DETECTED NOT DETECTED Final   Plesimonas shigelloides NOT DETECTED NOT DETECTED Final   Salmonella species NOT DETECTED NOT DETECTED Final   Yersinia enterocolitica NOT DETECTED NOT DETECTED Final   Vibrio species NOT DETECTED NOT DETECTED Final   Vibrio cholerae NOT DETECTED NOT DETECTED Final   Enteroaggregative E coli (EAEC) NOT DETECTED NOT DETECTED Final   Enteropathogenic E coli (EPEC) NOT DETECTED NOT DETECTED Final   Enterotoxigenic E coli (ETEC) NOT DETECTED NOT DETECTED Final   Shiga like toxin producing E coli (STEC) NOT DETECTED NOT DETECTED Final   Shigella/Enteroinvasive E coli (EIEC) NOT DETECTED NOT DETECTED Final   Cryptosporidium NOT DETECTED NOT DETECTED Final   Cyclospora cayetanensis NOT DETECTED NOT DETECTED Final   Entamoeba histolytica NOT DETECTED NOT DETECTED Final   Giardia lamblia NOT DETECTED  NOT DETECTED Final   Adenovirus F40/41 NOT DETECTED NOT DETECTED Final   Astrovirus NOT DETECTED NOT DETECTED Final    Norovirus GI/GII NOT DETECTED NOT DETECTED Final   Rotavirus A NOT DETECTED NOT DETECTED Final   Sapovirus (I, II, IV, and V) NOT DETECTED NOT DETECTED Final    Comment: Performed at [facility], [address], [place], [place] [address]     Radiological Exams on Admission:   Assessment/Plan Principal Problem:   Acute on chronic systolic CHF (congestive heart failure) (HCC) Active Problems:   Pleural effusion due to CHF (congestive heart failure) (HCC)   COPD (chronic obstructive pulmonary disease) (HCC)   HTN (hypertension)   Chronic a-fib (HCC)   CKD (chronic kidney disease) stage 4, GFR 15-29 ml/min (HCC)   Hypokalemia   Type II diabetes mellitus with renal manifestations (HCC)   Ileus (HCC)   Overweight (BMI 25.0-29.9)   Assessment and Plan:  Acute on chronic systolic CHF (congestive heart failure) [facility]): Patient has a 3+ leg edema, elevated BNP > 4500, consistent with CHF exacerbation.  2D echo 08/09/2023 showed EF 25 to 30%.  -Will admit to tele bed   as inpatient -Lasix 40 mg bid by IV -trend trop -Daily weights -strict I/O's -Low salt diet -Fluid restriction -As needed bronchodilators for shortness of breath  Pleural effusion due to CHF (congestive heart failure) [facility]): Patient has bilateral large pleural effusion. -Patient is on IV Lasix, if no improvement, may consider thoracentesis.  COPD (chronic obstructive pulmonary disease) (HCC) -Bronchodilators and as needed Mucinex  HTN (hypertension) -IV hydralazine as needed -Coreg, oral hydralazine, Imdur  Chronic a-fib (HCC): Heart rate 70s -Eliquis -Coreg  CKD (chronic kidney disease) stage 4, GFR 15-29 ml/min [facility]): Renal function stable.  Baseline creatinine 2.73 on 08/16/2023.  Her creatinine is 2.37, BUN 38, GFR 19 today. -For now with BMP  Hypokalemia: Potassium 3.2.  Magnesium 1.9, phosphorus 3.0. -Repleted potassium  Type II diabetes mellitus with renal manifestations (HCC): Recent A1c  7.0.  Patient is taking Jardiance at home -Sliding scale insulin  Recent history of Ileus [facility]): Patient still has mild lower abdominal pain, but no nausea vomiting or diarrhea.  Patient finished course of Augmentin for possible diverticulitis.  Today no fever or leukocytosis. CT showed that the degree of colonic dilatation has improved in the interval. -Supportive care -Continue home tramadol and as needed Tylenol  Overweight (BMI 25.0-29.9): Body weight 88.2 kg, BMI 28.71 -Encourage losing weight -Exercise and healthy diet        DVT ppx: on Eliquis  Code Status: Full code    Family Communication:     not done, no family member is at bed side.          Disposition Plan:  Anticipate discharge back to previous environment  Consults called:  none  Admission status and Level of care: Telemetry Cardiac:  as inpt        Dispo: The patient is from: Home              Anticipated d/c is to: Home              Anticipated d/c date is: 2 days              Patient currently is not medically stable to d/c.    Severity of Illness:  The appropriate patient status for this patient is INPATIENT. Inpatient status is judged to be reasonable and necessary in order to provide the required intensity  of service to ensure the patient's safety. The patient's presenting symptoms, physical exam findings, and initial radiographic and laboratory data in the context of their chronic comorbidities is felt to place them at high risk for further clinical deterioration. Furthermore, it is not anticipated that the patient will be medically stable for discharge from the hospital within 2 midnights of admission.   * I certify that at the point of admission it is my clinical judgment that the patient will require inpatient hospital care spanning beyond 2 midnights from the point of admission due to high intensity of service, high risk for further deterioration and high frequency of surveillance  required.*       Date of Service 08/20/2023    Lorretta Harp [facility]   If 7PM-7AM, please contact night-coverage [email or web address] 08/20/2023, 12:10 AM

## 2023-08-19 NOTE — ED Provider Triage Note (Signed)
Emergency Medicine Provider Triage Evaluation Note  MYRTH DAHAN , a 89 y.o. female  was evaluated in triage.  Pt complains of being sick. Denies any associated symptoms. Does not give any other description of her symptoms other than being sick.  Recent hospital admissions from 1/14-1/19 for acute resp failure with hypoxia and acute on chronic HFrEF and 1/20-1/24 for ileus.  Review of Systems  Positive: Weakness, diarrhea Negative: N/V/, blurred vision, headache, congestion, abdominal pain, SOB, CP  Physical Exam  There were no vitals taken for this visit. Gen:   Awake, no distress , non-toxic appearing Resp:  Normal effort  MSK:   Moves extremities without difficulty  Other:    Medical Decision Making  Medically screening exam initiated at 3:51 PM.  Appropriate orders placed.  JORI FRERICHS was informed that the remainder of the evaluation will be completed by another provider, this initial triage assessment does not replace that evaluation, and the importance of remaining in the ED until their evaluation is complete.    Cameron Ali, PA-C 08/19/23 1557

## 2023-08-19 NOTE — ED Notes (Signed)
First Nurse Note: Pt to ED via [facility] from home for feeling sick. Pt is having soreness in her stomach. Pt told EMS she was supposed to have surgery for Diverticulitis but they gave her antibiotics instead. Pts vital signs stable with EMS.

## 2023-08-20 ENCOUNTER — Encounter: Payer: Self-pay | Admitting: Internal Medicine

## 2023-08-20 ENCOUNTER — Telehealth: Payer: Self-pay | Admitting: *Deleted

## 2023-08-20 DIAGNOSIS — I5023 Acute on chronic systolic (congestive) heart failure: Secondary | ICD-10-CM | POA: Diagnosis not present

## 2023-08-20 LAB — BASIC METABOLIC PANEL
Anion gap: 10 (ref 5–15)
BUN: 33 mg/dL — ABNORMAL HIGH (ref 8–23)
CO2: 22 mmol/L (ref 22–32)
Calcium: 8.1 mg/dL — ABNORMAL LOW (ref 8.9–10.3)
Chloride: 106 mmol/L (ref 98–111)
Creatinine, Ser: 2.26 mg/dL — ABNORMAL HIGH (ref 0.44–1.00)
GFR, Estimated: 20 mL/min — ABNORMAL LOW (ref >60)
Glucose, Bld: 113 mg/dL — ABNORMAL HIGH (ref 70–99)
Potassium: 3.3 mmol/L — ABNORMAL LOW (ref 3.5–5.1)
Sodium: 138 mmol/L (ref 135–145)

## 2023-08-20 LAB — CBG MONITORING, ED
Glucose-Capillary: 101 mg/dL — ABNORMAL HIGH (ref 70–99)
Glucose-Capillary: 101 mg/dL — ABNORMAL HIGH (ref 70–99)
Glucose-Capillary: 111 mg/dL — ABNORMAL HIGH (ref 70–99)
Glucose-Capillary: 133 mg/dL — ABNORMAL HIGH (ref 70–99)
Glucose-Capillary: 162 mg/dL — ABNORMAL HIGH (ref 70–99)

## 2023-08-20 MED ORDER — INSULIN ASPART 100 UNIT/ML IJ SOLN: 0.0000 [IU] | Freq: Every day | INTRAMUSCULAR | Status: DC

## 2023-08-20 MED ORDER — POTASSIUM CHLORIDE CRYS ER 20 MEQ PO TBCR
40.0000 meq | EXTENDED_RELEASE_TABLET | ORAL | Status: AC
  Administered 2023-08-20 (×2): 40 meq via ORAL
  Filled 2023-08-20 (×2): qty 2

## 2023-08-20 MED ORDER — ZINC OXIDE 40 % EX OINT
TOPICAL_OINTMENT | Freq: Two times a day (BID) | CUTANEOUS | Status: DC
  Filled 2023-08-20 (×2): qty 113

## 2023-08-20 MED ORDER — INSULIN ASPART 100 UNIT/ML IJ SOLN
0.0000 [IU] | Freq: Three times a day (TID) | INTRAMUSCULAR | Status: DC
  Administered 2023-08-20 – 2023-08-23 (×6): 1 [IU] via SUBCUTANEOUS
  Filled 2023-08-20 (×6): qty 1

## 2023-08-20 MED ORDER — FUROSEMIDE 10 MG/ML IJ SOLN
40.0000 mg | Freq: Two times a day (BID) | INTRAMUSCULAR | Status: DC
  Administered 2023-08-20 – 2023-08-23 (×7): 40 mg via INTRAVENOUS
  Filled 2023-08-20 (×7): qty 4

## 2023-08-20 NOTE — Progress Notes (Signed)
Progress Note   Patient: Leslie Parker WJX:[id number] DOB: 01/16/1933 DOA: 08/19/2023     1 DOS: the patient was seen and examined on 08/20/2023   Brief hospital course: HPI on admission: "Leslie Parker is a 89 y.o. female with medical history significant of sCHF with EF 25-30%, SSS (s/p of PPM), A fib on Eliquis, HTN, HLD, DM, COPD, CKD-3b, osteopenia, recent ileus and possible diverticulitis, who presents with SOB   Patient was recently hospitalized from 01/20 - 1/24 due to ileus and possible diverticulitis.  Patient was treated with Zosyn and discharged on course of Augmentin.  Patient states that she finished antibiotic.  She has mild lower abdominal pain, but no nausea, vomiting, diarrhea today.  No fever or chills. Patient has shortness of breath and mild dry cough, no chest pain.  She has worsening bilateral leg edema.  Feeling sick recently with generalized weakness, poor appetite and decreased oral intake.   Data reviewed independently and ED Course: pt was found to have BNP> 4500, WBC 5.5, stable renal function, urinalysis (clear appearance, small amount of leukocyte, rare bacteria, WBC 6-10), temperature normal, blood pressure 170/86, heart rate 75, RR 18, oxygen saturation 97% on room air.  Patient is admitted to telemetry bed as inpatient.   CT of abdomen/pelvis: Large bilateral pleural effusions with lower lobe atelectasis. Stable peripancreatic cyst. Follow-up can be performed as previously described. Diverticulosis without diverticulitis. The degree of colonic dilatation has improved in the interval."   Pt admitted for acute on chronic systolic CHF and started on IV diuresis, in addition to persistent abdominal symptoms related to her ongoing recovery from diverticulitis.    Assessment and Plan:  Acute on chronic systolic CHF (congestive heart failure) [facility]): Patient has a 3+ leg edema, elevated BNP > 4500, consistent with CHF exacerbation.  2D echo 08/09/2023 showed EF 25 to  30%. --Continue Lasix 40 mg bid by IV --Daily weights --strict I/O's --Low salt diet --Fluid restriction --As needed bronchodilators for shortness of breath   Pleural effusion due to CHF (congestive heart failure) [facility]): Patient has bilateral large pleural effusion. -Continue on IV Lasix, if no improvement, may consider thoracentesis.   COPD (chronic obstructive pulmonary disease) (HCC) --Bronchodilators and as needed Mucinex   HTN (hypertension) --IV hydralazine as needed --Coreg, oral hydralazine, Imdur   Chronic a-fib (HCC): Heart rate controlled. --Eliquis --Coreg   CKD (chronic kidney disease) stage 4, GFR 15-29 ml/min [facility]): Renal function stable.   --Follow BMP's --Renally dose meds, avoid nephrotoxins & hypotension   Hypokalemia: Potassium 3.3.  --K replacement ordered --Monitor BMP and Mg, replaced K PRN   Type II diabetes mellitus with renal manifestations (HCC): Recent A1c 7.0.  Patient is taking Jardiance at home -Sliding scale Novolog   Recent history of Ileus Recent acute diverticulitis Diarrhea - POA, intermittent at home PTA with above hx Patient still has mild lower abdominal pain, but no nausea vomiting, intermittent diarrhea.  Patient finished course of Augmentin for suspected diverticulitis.  On admission no fever or leukocytosis. CT showed that the degree of colonic dilatation has improved in the interval. -Supportive care -Continue home tramadol and as needed Tylenol -Check stool studies -Hold off Imodium until infection ruled out, unlikely but has been on antibiotics and recently admitted   Overweight (BMI 25.0-29.9): Body weight 88.2 kg, BMI 28.71 Complicates overall care and prognosis.  Recommend lifestyle modifications including physical activity and diet for weight loss and overall long-term health.        Subjective: Pt  seen in the ED holding for a bed this AM. She reports breathing is somewhat improved.  Leg swelling persistent.  She had  diarrhea again earlier this AM or overnight but none since.  Still some lower abdominal pain and tenderness. She denies any UTI symptoms.  Physical Exam: Vitals:   08/20/23 1702 08/20/23 1707 08/20/23 1930 08/20/23 2000  BP: (!) 135/91  120/61 124/65  Pulse:   60 60  Resp:   13 16  Temp:  98.4 F (36.9 C)    TempSrc:  Oral    SpO2:   98% 96%  Weight:      Height:       General exam: awake, alert, no acute distress HEENT: atraumatic, clear conjunctiva, anicteric sclera, moist mucus membranes, hearing grossly normal  Respiratory system: CTAB diminished bases, no wheezes, rales or rhonchi, normal respiratory effort. Cardiovascular system: normal S1/S2, RRR, 3+ BLE pitting edema Gastrointestinal system: soft, lower mid and left-sided tenderness with mild guarding on palpation no rebound tenderness, non-distended, no HSM felt, +bowel sounds. Central nervous system: A&O x 3. no gross focal neurologic deficits, normal speech Skin: dry, intact, normal temperature Psychiatry: normal mood, congruent affect, judgement and insight appear normal   Data Reviewed:  Notable labs --  K 3.3 Glucose 113 BUN 33 Cr improved 2.26 Ca 8.1  Family Communication: None present. Will attempt to call as time allows.  Disposition: Status is: Inpatient Remains inpatient appropriate because: remains on IV lasix pending further improvement   Planned Discharge Destination: Home    Time spent: 45 minutes  Author: Pennie Banter, DO 08/20/2023 8:22 PM  For on call review [email or web address].

## 2023-08-20 NOTE — TOC Initial Note (Signed)
Transition of Care [facility]) - Initial/Assessment Note    Patient Details  Name: Leslie Parker MRN: [id number] Date of Birth: [birth year]  Transition [facility]) CM/SW Contact:    Margarito Liner, LCSW Phone Number: 08/20/2023, 3:50 PM  Clinical Narrative:  Readmission prevention screen was completed on 1/16:  "CSW completed High Risk Readmission assessment w/ pt's Grand daughter, Avon Gully.    Admitted UKG:URKYH Failure  Admitted from: Home  PCP: Dr. Nemiah Commander  [facility] Current home health/prior home health/DME: [facility] w/ [facility]    Pt's Granddaughter would like pt to continue services w/ [facility], once discharged from hospital.    [facility] will continue to follow "       [facility] liaison confirmed they are still active for PT, OT, RN.            Expected Discharge Plan: Home w Home Health Services Barriers to Discharge: Continued Medical Work up   Patient Goals and CMS Choice            Expected Discharge Plan and Services       Living arrangements for the past 2 months: Apartment                           HH Arranged: RN, PT, OT HH Agency: [facility] Date [facility] : [facility] Agency Contacted: 08/20/23   Representative spoke with at [facility] Agency: Gearldine Bienenstock  Prior Living Arrangements/Services Living arrangements for the past 2 months: Apartment Lives with:: Self Patient language and need for interpreter reviewed:: Yes        Need for Family Participation in Patient Care: Yes (Comment)   Current home services: Home OT, Home PT, Home RN, DME Criminal Activity/Legal Involvement Pertinent to Current Situation/Hospitalization: No - Comment as needed  Activities of Daily Living   ADL Screening (condition at time of admission) Independently performs ADLs?: No Does the patient have a NEW difficulty with bathing/dressing/toileting/self-feeding that is expected to last >3 days?: No Does the patient have a NEW difficulty with getting in/out of bed, walking, or  climbing stairs that is expected to last >3 days?: No Does the patient have a NEW difficulty with communication that is expected to last >3 days?: No Is the patient deaf or have difficulty hearing?: No Does the patient have difficulty seeing, even when wearing glasses/contacts?: No Does the patient have difficulty concentrating, remembering, or making decisions?: No  Permission Sought/Granted                  Emotional Assessment       Orientation: : Oriented to Self, Oriented to Place, Oriented to  Time, Oriented to Situation Alcohol / Substance Use: Not Applicable Psych Involvement: No (comment)  Admission diagnosis:  Acute on chronic systolic CHF (congestive heart failure) (HCC) [I50.23] Patient Active Problem List   Diagnosis Date Noted   Acute on chronic systolic CHF (congestive heart failure) (HCC) 08/19/2023   Pleural effusion due to CHF (congestive heart failure) (HCC) 08/19/2023   HTN (hypertension) 08/19/2023   Ileus (HCC) 08/12/2023   Large bowel obstruction (HCC) 08/12/2023   CKD (chronic kidney disease) stage 4, GFR 15-29 ml/min (HCC) 08/12/2023   HFrEF (heart failure with reduced ejection fraction) (HCC) 08/12/2023   Acute combined systolic (congestive) and diastolic (congestive) heart failure (HCC) - LVEF 25% 08/10/2023   Overweight (BMI 25.0-29.9) 05/01/2023   Left hip pain 05/01/2023   Hypokalemia 04/30/2023   Anemia 03/21/2023   Abnormal LFTs  03/20/2023   Type II diabetes mellitus with renal manifestations (HCC) 02/13/2023   Chronic diastolic CHF (congestive heart failure) (HCC) 02/13/2023   CKD stage 3 due to type 2 diabetes mellitus (HCC) 11/24/2022   Paroxysmal atrial fibrillation (HCC) 11/24/2022   Sick sinus syndrome (HCC) 11/15/2022   Acute on chronic diastolic CHF (congestive heart failure) (HCC) 08/27/2022   COPD (chronic obstructive pulmonary disease) (HCC) 08/23/2022   Chronic a-fib (HCC) 08/22/2022   Diabetes mellitus without complication  (HCC)    Stage 3b chronic kidney disease (HCC) - Baseline scr 1.8-2.0    Postural dizziness with presyncope    Hypomagnesemia    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 [place] [address] Phone: [phone] Fax: [phone]  [id number] [place] - [address] [place] [address] Phone: [phone] Fax: [phone]     Social Drivers of Health (SDOH) Social History: SDOH Screenings   Food Insecurity: No Food Insecurity (08/20/2023)  Housing: Low Risk  (08/20/2023)  Transportation Needs: No Transportation Needs (08/20/2023)  Utilities: Not At Risk (08/20/2023)  Alcohol Screen: Low Risk  (08/20/2023)  Financial Resource Strain: Low Risk  (08/20/2023)  Social Connections: Moderately Integrated (08/20/2023)  Tobacco Use: Low Risk  (08/20/2023)   SDOH Interventions: Housing Interventions: Intervention Not Indicated Transportation Interventions: Intervention Not Indicated Alcohol Usage Interventions: Intervention Not Indicated (Score <7) Financial Strain Interventions: Intervention Not Indicated   Readmission Risk Interventions    08/20/2023    3:45 PM 08/16/2023   12:42 PM 08/08/2023   11:36 AM  Readmission Risk Prevention Plan  Transportation Screening Complete Complete Complete  Medication Review Oceanographer) Complete Complete Complete  PCP or Specialist appointment within 3-5 days of discharge Complete  Complete  HRI or Home Care Consult Complete Complete Complete  SW Recovery Care/Counseling Consult Complete Complete Complete  Palliative Care Screening Not Applicable Not Applicable Not Applicable  Skilled Nursing Facility Not Applicable Not Applicable Not  Applicable

## 2023-08-20 NOTE — ED Notes (Signed)
Pt desatted down to 75% on RA when sleeping, placed back onto 2L [place] and O2 improved to 98%. When awake Pt sats are WNL, MD notified

## 2023-08-20 NOTE — Patient Outreach (Signed)
  Care Coordination   Follow Up Visit Note   08/20/2023 Name: Leslie Parker MRN: [id number] DOB: [birth year]  Leslie Parker is a 89 y.o. year old female who sees [name], MD for primary care.   Noted that patient is again in the ED awaiting tele bed for admission.  Recently admitted 1/20-1/24 for ileus. She has had 7 admissions in the last 6 months, with 3 of them this month. Hospital liaisons notified of admission.    SDOH assessments and interventions completed:  No     Care Coordination Interventions:  No, not indicated   Follow up plan:  pending hospital disposition    Encounter Outcome:  Patient Visit Completed   ,Rodney Langton, RN, MSN, CCM [place]  [facility], [facility] RN Care Coordinator Direct Dial: [phone] / Main [phone] Fax [phone] Email: [email or web address]: [email or web address]

## 2023-08-20 NOTE — Progress Notes (Signed)
Education Assessment and Provision:  Detailed education and instructions provided on heart failure disease management including the following:  Signs and symptoms of Heart Failure When to call the physician Importance of daily weights Low sodium diet Fluid restriction Medication management Anticipated future follow-up appointments  Patient education given on each of the above topics.  Patient acknowledges understanding via teach back method and acceptance of all instructions.  Education Materials:  "Living Better With Heart Failure" Booklet, HF zone tool, & Daily Weight Tracker Tool.  Patient has scale at home: Yes Patient has pill box at home: Yes      Heart Failure Navigator Progress Note  Assessed for Heart & Vascular TOC clinic readiness.  Patient has a device check and Heart Failure appointment scheduled with [facility] next week per Chester Holstein, PA-C.  Navigator will sign off at this time.  [name], RN, BSN [facility] Heart Failure Navigator Secure Chat Only

## 2023-08-21 DIAGNOSIS — I5023 Acute on chronic systolic (congestive) heart failure: Secondary | ICD-10-CM | POA: Diagnosis not present

## 2023-08-21 LAB — BASIC METABOLIC PANEL
Anion gap: 7 (ref 5–15)
BUN: 31 mg/dL — ABNORMAL HIGH (ref 8–23)
CO2: 24 mmol/L (ref 22–32)
Calcium: 7.9 mg/dL — ABNORMAL LOW (ref 8.9–10.3)
Chloride: 105 mmol/L (ref 98–111)
Creatinine, Ser: 2.17 mg/dL — ABNORMAL HIGH (ref 0.44–1.00)
GFR, Estimated: 21 mL/min — ABNORMAL LOW (ref >60)
Glucose, Bld: 124 mg/dL — ABNORMAL HIGH (ref 70–99)
Potassium: 3.9 mmol/L (ref 3.5–5.1)
Sodium: 136 mmol/L (ref 135–145)

## 2023-08-21 LAB — CBG MONITORING, ED
Glucose-Capillary: 113 mg/dL — ABNORMAL HIGH (ref 70–99)
Glucose-Capillary: 136 mg/dL — ABNORMAL HIGH (ref 70–99)
Glucose-Capillary: 148 mg/dL — ABNORMAL HIGH (ref 70–99)

## 2023-08-21 NOTE — Progress Notes (Signed)
Progress Note   Patient: Leslie Parker YQM:[id number] DOB: 1933-02-21 DOA: 08/19/2023     2 DOS: the patient was seen and examined on 08/21/2023     Brief hospital course: HPI on admission: "BRENLEIGH COLLET is a 89 y.o. female with medical history significant of sCHF with EF 25-30%, SSS (s/p of PPM), A fib on Eliquis, HTN, HLD, DM, COPD, CKD-3b, osteopenia, recent ileus and possible diverticulitis, who presents with SOB   Patient was recently hospitalized from 01/20 - 1/24 due to ileus and possible diverticulitis.  Patient was treated with Zosyn and discharged on course of Augmentin.  Patient states that she finished antibiotic.  She has mild lower abdominal pain, but no nausea, vomiting, diarrhea today.  No fever or chills. Patient has shortness of breath and mild dry cough, no chest pain.  She has worsening bilateral leg edema.  Feeling sick recently with generalized weakness, poor appetite and decreased oral intake.   Data reviewed independently and ED Course: pt was found to have BNP> 4500, WBC 5.5, stable renal function, urinalysis (clear appearance, small amount of leukocyte, rare bacteria, WBC 6-10), temperature normal, blood pressure 170/86, heart rate 75, RR 18, oxygen saturation 97% on room air.  Patient is admitted to telemetry bed as inpatient.   CT of abdomen/pelvis: Large bilateral pleural effusions with lower lobe atelectasis. Stable peripancreatic cyst. Follow-up can be performed as previously described. Diverticulosis without diverticulitis. The degree of colonic dilatation has improved in the interval."     Pt admitted for acute on chronic systolic CHF and started on IV diuresis, in addition to persistent abdominal symptoms related to her ongoing recovery from diverticulitis.       Assessment and Plan:   Acute on chronic systolic CHF (congestive heart failure) [facility]): Patient has a 3+ leg edema, elevated BNP > 4500, consistent with CHF exacerbation.  2D echo 08/09/2023 showed  EF 25 to 30%. --Continue Lasix 40 mg bid by IV --Daily weights Continue to monitor input and output --As needed bronchodilators for shortness of breath   Pleural effusion due to CHF (congestive heart failure) [facility]): Patient has bilateral large pleural effusion. -Continue on IV Lasix, if no improvement, may consider thoracentesis.   COPD (chronic obstructive pulmonary disease) (HCC) --Bronchodilators and as needed Mucinex   HTN (hypertension) --IV hydralazine as needed --Coreg, oral hydralazine, Imdur   Chronic a-fib (HCC): Heart rate controlled. Continue Eliquis and Coreg   CKD (chronic kidney disease) stage 4, GFR 15-29 ml/min [facility]): Renal function stable.   --Follow BMP's --Renally dose meds, avoid nephrotoxins & hypotension   Hypokalemia: Potassium 3.3.  --K replacement ordered --Monitor BMP and Mg, replaced K PRN    Type II diabetes mellitus with renal manifestations (HCC): Recent A1c 7.0.  Patient is taking Jardiance at home Continue insulin therapy   Recent history of Ileus Recent acute diverticulitis Diarrhea - POA, intermittent at home PTA with above hx Patient still has mild lower abdominal pain, but no nausea vomiting, intermittent diarrhea.  Patient finished course of Augmentin for suspected diverticulitis.  On admission no fever or leukocytosis. CT showed that the degree of colonic dilatation has improved in the interval. -Supportive care -Continue home tramadol and as needed Tylenol -GI panel negative   Overweight (BMI 25.0-29.9): B Counseled on weight loss when medically stable   Subjective:  Patient seen and examined at bedside this morning Currently on 1 L of intranasal oxygen Denies worsening diarrhea Yet to work with PT OT    Physical Exam: General  exam: awake, alert, no acute distress HEENT: atraumatic, clear conjunctiva, anicteric sclera, moist mucus membranes, hearing grossly normal  Respiratory system: CTAB diminished bases, no  wheezes, Cardiovascular system: normal S1/S2, RRR, 3+ BLE pitting edema Gastrointestinal system: soft, lower mid and left-sided tenderness with mild guarding on palpation no rebound tenderness, non-distended, no HSM felt, +bowel sounds. Central nervous system: A&O x 3. no gross focal neurologic deficits, normal speech Skin: dry, intact, normal temperature Psychiatry: normal mood, congruent affect, judgement and insight appear normal    Family Communication: None present. Will attempt to call as time allows.   Disposition: Status is: Inpatient Remains inpatient appropriate because: remains on IV lasix pending further improvement    Planned Discharge Destination: Home   Data Reviewed:      Latest Ref Rng & Units 08/21/2023    6:05 AM 08/20/2023    6:18 AM 08/19/2023    3:57 PM  BMP  Glucose 70 - 99 mg/dL 161  096  045   BUN 8 - 23 mg/dL 31  33  38   Creatinine 0.44 - 1.00 mg/dL 4.09  8.11  9.14   Sodium 135 - 145 mmol/L 136  138  135   Potassium 3.5 - 5.1 mmol/L 3.9  3.3  3.2   Chloride 98 - 111 mmol/L 105  106  103   CO2 22 - 32 mmol/L 24  22  21    Calcium 8.9 - 10.3 mg/dL 7.9  8.1  8.4        Latest Ref Rng & Units 08/19/2023    3:57 PM 08/15/2023    9:46 AM 08/13/2023    5:40 AM  CBC  WBC 4.0 - 10.5 K/uL 5.5  5.1  4.3   Hemoglobin 12.0 - 15.0 g/dL 78.2  95.6  21.3   Hematocrit 36.0 - 46.0 % 37.6  32.9  30.9   Platelets 150 - 400 K/uL 256  175  173     Vitals:   08/21/23 0735 08/21/23 1500 08/21/23 1630 08/21/23 1654  BP:  124/65 137/67   Pulse:  60 64   Resp:  15 17   Temp: 98.1 F (36.7 C)   97.9 F (36.6 C)  TempSrc:    Oral  SpO2:  98% 95%   Weight:      Height:         Author: Loyce Dys, MD 08/21/2023 5:45 PM  For on call review [email or web address].

## 2023-08-22 DIAGNOSIS — I5023 Acute on chronic systolic (congestive) heart failure: Secondary | ICD-10-CM | POA: Diagnosis not present

## 2023-08-22 LAB — GLUCOSE, CAPILLARY
Glucose-Capillary: 136 mg/dL — ABNORMAL HIGH (ref 70–99)
Glucose-Capillary: 114 mg/dL — ABNORMAL HIGH (ref 70–99)
Glucose-Capillary: 126 mg/dL — ABNORMAL HIGH (ref 70–99)
Glucose-Capillary: 112 mg/dL — ABNORMAL HIGH (ref 70–99)
Glucose-Capillary: 115 mg/dL — ABNORMAL HIGH (ref 70–99)

## 2023-08-22 LAB — BASIC METABOLIC PANEL
Anion gap: 8 (ref 5–15)
BUN: 35 mg/dL — ABNORMAL HIGH (ref 8–23)
CO2: 24 mmol/L (ref 22–32)
Calcium: 8 mg/dL — ABNORMAL LOW (ref 8.9–10.3)
Chloride: 102 mmol/L (ref 98–111)
Creatinine, Ser: 2.3 mg/dL — ABNORMAL HIGH (ref 0.44–1.00)
GFR, Estimated: 20 mL/min — ABNORMAL LOW (ref >60)
Glucose, Bld: 120 mg/dL — ABNORMAL HIGH (ref 70–99)
Potassium: 3.9 mmol/L (ref 3.5–5.1)
Sodium: 134 mmol/L — ABNORMAL LOW (ref 135–145)

## 2023-08-22 LAB — CBC WITH DIFFERENTIAL/PLATELET
Abs Immature Granulocytes: 0.05 10*3/uL (ref 0.00–0.07)
Basophils Absolute: 0 10*3/uL (ref 0.0–0.1)
Basophils Relative: 0 %
Eosinophils Absolute: 0 10*3/uL (ref 0.0–0.5)
Eosinophils Relative: 0 %
HCT: 31.1 % — ABNORMAL LOW (ref 36.0–46.0)
Hemoglobin: 10.3 g/dL — ABNORMAL LOW (ref 12.0–15.0)
Immature Granulocytes: 1 %
Lymphocytes Relative: 22 %
Lymphs Abs: 1.6 10*3/uL (ref 0.7–4.0)
MCH: 30.2 pg (ref 26.0–34.0)
MCHC: 33.1 g/dL (ref 30.0–36.0)
MCV: 91.2 fL (ref 80.0–100.0)
Monocytes Absolute: 0.5 10*3/uL (ref 0.1–1.0)
Monocytes Relative: 8 %
Neutro Abs: 4.9 10*3/uL (ref 1.7–7.7)
Neutrophils Relative %: 69 %
Platelets: 198 10*3/uL (ref 150–400)
RBC: 3.41 MIL/uL — ABNORMAL LOW (ref 3.87–5.11)
RDW: 17.5 % — ABNORMAL HIGH (ref 11.5–15.5)
WBC: 7.1 10*3/uL (ref 4.0–10.5)
nRBC: 0 % (ref 0.0–0.2)

## 2023-08-22 NOTE — TOC Progression Note (Signed)
Transition of Care [facility]) - Progression Note    Patient Details  Name: Leslie Parker MRN: [id number] Date of Birth: 1933/07/17  Transition of Care [facility]) CM/SW Contact  Truddie Hidden, RN Phone Number: 08/22/2023, 12:53 PM  Clinical Narrative:    TOC continuing to follow patient's progress throughout discharge planning.   Expected Discharge Plan: Home w Home Health Services Barriers to Discharge: Continued Medical Work up  Expected Discharge Plan and Services       Living arrangements for the past 2 months: Apartment                           HH Arranged: RN, PT, OT HH Agency: [facility] Date [facility] Agency Contacted: 08/20/23   Representative spoke with at [facility] Agency: Gearldine Bienenstock   Social Determinants of Health (SDOH) Interventions SDOH Screenings   Food Insecurity: No Food Insecurity (08/20/2023)  Housing: Low Risk  (08/20/2023)  Transportation Needs: No Transportation Needs (08/20/2023)  Utilities: Not At Risk (08/20/2023)  Alcohol Screen: Low Risk  (08/20/2023)  Financial Resource Strain: Low Risk  (08/20/2023)  Social Connections: Moderately Integrated (08/20/2023)  Tobacco Use: Low Risk  (08/20/2023)    Readmission Risk Interventions    08/20/2023    3:45 PM 08/16/2023   12:42 PM 08/08/2023   11:36 AM  Readmission Risk Prevention Plan  Transportation Screening Complete Complete Complete  Medication Review Oceanographer) Complete Complete Complete  PCP or Specialist appointment within 3-5 days of discharge Complete  Complete  HRI or Home Care Consult Complete Complete Complete  SW Recovery Care/Counseling Consult Complete Complete Complete  Palliative Care Screening Not Applicable Not Applicable Not Applicable  Skilled Nursing Facility Not Applicable Not Applicable Not Applicable

## 2023-08-22 NOTE — Evaluation (Signed)
Physical Therapy Evaluation Patient Details Name: Leslie Parker MRN: [id number] DOB: 09/13/1932 Today's Date: 08/22/2023  History of Present Illness  Pt is an 89 y.o. female presenting to hospital 08/19/23 with c/o not feeling well.  Recent hospitalization (discharged 24th of January).  Pt admitted with acute on chronic systolic CHF, pelural effusion, COPD, htn, hypokalemia, and recent ileus.  PMH includes sCHF, SSS s/p PPM, a-fib on Eliquis, htn, HLD, DM, COPD, CKD, osteopenia, recent ileus and possible diverticulitis, back sx, B CTR, B hip surgeries, B knee joint replacements, RCR L, R TSR.  Clinical Impression  Prior to recent medical concerns, pt was modified independent with ambulation using rollator; lives alone in senior living apts; receives home health nursing and therapy services (PT/OT).  No c/o pain during session.  Currently pt is min assist semi-supine to sitting EOB; SBA with transfers using RW; and CGA to ambulate 40 feet with RW use.  Nursing cleared PT to trial pt on room air; mild SOB noted with ambulation.  Pt's SpO2 sats 95% on 1 L O2 at rest; SpO2 sats 90% on room air at rest; SpO2 sats 85% post ambulation on room air; and SpO2 sats 97% on 1 L O2 end of session at rest (nurse updated on pt's status).   Pt would currently benefit from skilled PT to address noted impairments and functional limitations (see below for any additional details).  Upon hospital discharge, pt would benefit from ongoing therapy.     If plan is discharge home, recommend the following: Assist for transportation;Assistance with cooking/housework   Can travel by private vehicle    Yes    Equipment Recommendations None recommended by PT (pt has needed DME at home already)  Recommendations for Other Services       Functional Status Assessment Patient has had a recent decline in their functional status and demonstrates the ability to make significant improvements in function in a reasonable and predictable  amount of time.     Precautions / Restrictions Precautions Precautions: Fall Restrictions Weight Bearing Restrictions Per Provider Order: No      Mobility  Bed Mobility Overal bed mobility: Needs Assistance Bed Mobility: Supine to Sit     Supine to sit: Min assist     General bed mobility comments: assist for B LE's    Transfers Overall transfer level: Needs assistance Equipment used: Rolling walker (2 wheels) Transfers: Sit to/from Stand Sit to Stand: Supervision   Step pivot transfers: Supervision (bed to recliner with RW use)       General transfer comment: mild increased effort to stand but steady/safe; x1 trial from bed and x1 trial from recliner    Ambulation/Gait Ambulation/Gait assistance: Contact guard assist Gait Distance (Feet): 40 Feet Assistive device: Rolling walker (2 wheels) Gait Pattern/deviations: Step-through pattern, Decreased step length - right, Decreased step length - left Gait velocity: decreased     General Gait Details: steady ambulating with RW use; mild SOB with activity  Stairs            Wheelchair Mobility     Tilt Bed    Modified Rankin (Stroke Patients Only)       Balance Overall balance assessment: Needs assistance Sitting-balance support: No upper extremity supported, Feet supported Sitting balance-[name]: Good Sitting balance - Comments: steady reaching within BOS   Standing balance support: Bilateral upper extremity supported, Reliant on assistive device for balance, During functional activity Standing balance-[name]: Good Standing balance comment: steady ambulating with RW use  Pertinent Vitals/Pain Pain Assessment Pain Assessment: No/denies pain Pain Intervention(s): Limited activity within patient's tolerance, Monitored during session HR 83-88 bpm during sessions activities.    Home Living Family/patient expects to be discharged to:: Private  residence Living Arrangements: Alone Available Help at Discharge: Family;Available PRN/intermittently Type of Home: Apartment Home Access: Level entry       Home Layout: One level Home Equipment: Rollator (4 wheels);Cane - single point;Grab bars - tub/shower;Grab bars - toilet;Lift chair;Transport chair;Tub bench;Rolling Walker (2 wheels);Shower seat Additional Comments: Senior living apartment    Prior Function Prior Level of Function : Needs assist;History of Falls (last six months)             Mobility Comments: Modified independent ambulating with rollator; 1 fall in July; family assists with leaving home using transport w/c.       Extremity/Trunk Assessment   Upper Extremity Assessment Upper Extremity Assessment: Defer to OT evaluation    Lower Extremity Assessment Lower Extremity Assessment: Generalized weakness    Cervical / Trunk Assessment Cervical / Trunk Assessment: Kyphotic  Communication   Communication Communication: No apparent difficulties Cueing Techniques: Verbal cues  Cognition Arousal: Alert Behavior During Therapy: WFL for tasks assessed/performed Overall Cognitive Status: Within Functional Limits for tasks assessed                                          General Comments  Nursing cleared pt for participation in physical therapy.  Pt agreeable to PT session.    Exercises     Assessment/Plan    PT Assessment Patient needs continued PT services  PT Problem List Decreased strength;Decreased activity tolerance;Decreased balance;Decreased mobility;Cardiopulmonary status limiting activity       PT Treatment Interventions DME instruction;Gait training;Functional mobility training;Therapeutic activities;Therapeutic exercise;Balance training;Patient/family education    PT Goals (Current goals can be found in the Care Plan section)  Acute Rehab PT Goals Patient Stated Goal: to go home PT Goal Formulation: With patient Time  For Goal Achievement: 09/05/23 Potential to Achieve Goals: Good    Frequency Min 1X/week     Co-evaluation               AM-PAC PT "6 Clicks" Mobility  Outcome Measure Help needed turning from your back to your side while in a flat bed without using bedrails?: None Help needed moving from lying on your back to sitting on the side of a flat bed without using bedrails?: A Little Help needed moving to and from a bed to a chair (including a wheelchair)?: A Little Help needed standing up from a chair using your arms (e.g., wheelchair or bedside chair)?: A Little Help needed to walk in hospital room?: A Little Help needed climbing 3-5 steps with a railing? : A Little 6 Click Score: 19    End of Session Equipment Utilized During Treatment: Gait belt Activity Tolerance: Patient tolerated treatment well Patient left: in chair;with call bell/phone within reach;with chair alarm set Nurse Communication: Mobility status;Precautions;Other (comment) (Pt's SpO2 sats during session) PT Visit Diagnosis: Other abnormalities of gait and mobility (R26.89);Muscle weakness (generalized) (M62.81)    Time: 9562-1308 PT Time Calculation (min) (ACUTE ONLY): 27 min   Charges:   PT Evaluation $PT Eval Low Complexity: 1 Low PT Treatments $Therapeutic Activity: 8-22 mins PT General Charges $$ ACUTE PT VISIT: 1 Visit        [name], PT 08/22/23, 11:11  AM

## 2023-08-22 NOTE — Plan of Care (Signed)

## 2023-08-22 NOTE — Progress Notes (Signed)
Occupational Therapy Evaluation Patient Details Name: Leslie Parker MRN: [id number] DOB: 09-07-1932 Today's Date: 08/22/2023   History of Present Illness Pt is an 89 y.o. female presenting to hospital 08/19/23 with c/o not feeling well.  Recent hospitalization (discharged 24th of January).  Pt admitted with acute on chronic systolic CHF, pelural effusion, COPD, htn, hypokalemia, and recent ileus.  PMH includes sCHF, SSS s/p PPM, a-fib on Eliquis, htn, HLD, DM, COPD, CKD, osteopenia, recent ileus and possible diverticulitis, back sx, B CTR, B hip surgeries, B knee joint replacements, RCR L, R TSR.   Clinical Impression   Pt was seen for OT evaluation this date. Prior to hospital admission, pt was living at home alone with intermittent assist from family for IADLs. Pt MOD I with rollator use. Family provides meals, etc.  Pt presents to acute OT demonstrating impaired ADL performance and functional mobility 2/2 weakness, low activity tolerance and pain (See OT problem list for additional functional deficits). Pt currently requires CGA/SBA for bed mobility with cueing for bed rail use, Min A for BLE management to return to bed. STS with SUP, SPT to [facility] with SUP and able to perform anterior hygiene on her own. Able to stand at sink to wash face, reaching for washcloth, etc with SUP holding to sink. Lateral steps taken to [facility] with SBA.  Pt would benefit from skilled OT services to address noted impairments and functional limitations (see below for any additional details) in order to maximize safety and independence while minimizing falls risk and caregiver burden. Do anticipate the need for follow up OT services upon acute hospital DC.        If plan is discharge home, recommend the following: A little help with walking and/or transfers;A little help with bathing/dressing/bathroom;Assistance with cooking/housework;Assist for transportation    Functional Status Assessment  Patient has had a recent decline  in their functional status and demonstrates the ability to make significant improvements in function in a reasonable and predictable amount of time.  Equipment Recommendations  None recommended by OT    Recommendations for Other Services       Precautions / Restrictions Precautions Precautions: Fall Restrictions Weight Bearing Restrictions Per Provider Order: No      Mobility Bed Mobility Overal bed mobility: Needs Assistance Bed Mobility: Supine to Sit     Supine to sit: Supervision, Contact guard Sit to supine: Min assist   General bed mobility comments: Min A for BLE management to return to bed; CGA/SUP to get to EOB    Transfers Overall transfer level: Needs assistance Equipment used: Rolling walker (2 wheels) Transfers: Sit to/from Stand Sit to Stand: Supervision     Step pivot transfers: Supervision     General transfer comment: SUP for STS and SPT to [facility]; also able to stand at sink and reach for washcloth, turn water on, etc and lateral step to [facility] with no AD use      Balance Overall balance assessment: Needs assistance Sitting-balance support: No upper extremity supported, Feet supported Sitting balance-[name]: Good Sitting balance - Comments: able to don mesh panties   Standing balance support: During functional activity Standing balance-[name]: Good Standing balance comment: no LOB at EOB performing ADLs in standing                           ADL either performed or assessed with clinical judgement   ADL Overall ADL's : Needs assistance/impaired  Lower Body Dressing: Sit to/from stand;Sitting/lateral leans;Supervision/safety Lower Body Dressing Details (indicate cue type and reason): to don mesh panties Toilet Transfer: Contact guard assist;BSC/3in1;Stand-pivot Toilet Transfer Details (indicate cue type and reason): SBA for BSC transfer from EOB Toileting- Clothing Manipulation and Hygiene:  Sitting/lateral lean;Supervision/safety;Set up Toileting - Clothing Manipulation Details (indicate cue type and reason): anterior hygiene             Vision         Perception         Praxis         Pertinent Vitals/Pain Pain Assessment Pain Assessment: Faces Faces Pain Scale: Hurts a little bit Pain Location: abdomen Pain Descriptors / Indicators: Aching Pain Intervention(s): Monitored during session, Limited activity within patient's tolerance     Extremity/Trunk Assessment Upper Extremity Assessment Upper Extremity Assessment: Overall WFL for tasks assessed   Lower Extremity Assessment Lower Extremity Assessment: Generalized weakness   Cervical / Trunk Assessment Cervical / Trunk Assessment: Kyphotic   Communication Communication Communication: No apparent difficulties Cueing Techniques: Verbal cues   Cognition Arousal: Alert Behavior During Therapy: WFL for tasks assessed/performed Overall Cognitive Status: Within Functional Limits for tasks assessed                                       General Comments       Exercises Other Exercises Other Exercises: Edu on role of OT in acute setting   Shoulder Instructions      Home Living Family/patient expects to be discharged to:: Private residence Living Arrangements: Alone Available Help at Discharge: Family;Available PRN/intermittently Type of Home: Apartment Home Access: Level entry     Home Layout: One level     Bathroom Shower/Tub: Producer, television/film/video: Handicapped height Bathroom Accessibility: Yes   Home Equipment: Rollator (4 wheels);Cane - single point;Grab bars - tub/shower;Grab bars - toilet;Lift chair;Transport chair;Tub bench;Rolling Walker (2 wheels);Shower seat   Additional Comments: Senior living apartment      Prior Functioning/Environment Prior Level of Function : Needs assist;History of Falls (last six months)             Mobility Comments:  Modified independent ambulating with rollator; 1 fall in July; family assists with leaving home using transport w/c. ADLs Comments: Seated shower, mod indep with dressing, indep with meals, meds. Family assists with groceries, driving, trash, and mail        OT Problem List: Decreased strength;Decreased activity tolerance;Decreased knowledge of use of DME or AE;Impaired balance (sitting and/or standing);Cardiopulmonary status limiting activity      OT Treatment/Interventions: Self-care/ADL training;Therapeutic exercise;Therapeutic activities;DME and/or AE instruction;Energy conservation;Patient/family education;Balance training    OT Goals(Current goals can be found in the care plan section) Acute Rehab OT Goals Patient Stated Goal: improve breathing/strength OT Goal Formulation: With patient Time For Goal Achievement: 09/05/23 Potential to Achieve Goals: Good ADL Goals Pt Will Perform Lower Body Bathing: with supervision;sitting/lateral leans;sit to/from stand Pt Will Perform Lower Body Dressing: with supervision;with modified independence;sit to/from stand;sitting/lateral leans Pt Will Transfer to Toilet: with modified independence;ambulating;regular height toilet Pt Will Perform Toileting - Clothing Manipulation and hygiene: with modified independence;sitting/lateral leans;sit to/from stand Additional ADL Goal #1: Pt will verbalize/demo plan to implement 1 learned ECS to prevent overexertion during ADL performance.  OT Frequency: Min 1X/week    Co-evaluation              AM-PAC OT "  6 Clicks" Daily Activity     Outcome Measure Help from another person eating meals?: None Help from another person taking care of personal grooming?: None Help from another person toileting, which includes using toliet, bedpan, or urinal?: A Little Help from another person bathing (including washing, rinsing, drying)?: A Little Help from another person to put on and taking off regular upper body  clothing?: None Help from another person to put on and taking off regular lower body clothing?: A Little 6 Click Score: 21   End of Session Nurse Communication: Mobility status  Activity Tolerance: Patient tolerated treatment well Patient left: in bed;with call bell/phone within reach;with bed alarm set  OT Visit Diagnosis: Other abnormalities of gait and mobility (R26.89);Muscle weakness (generalized) (M62.81)                Time: 1610-9604 OT Time Calculation (min): 19 min Charges:  OT General Charges $OT Visit: 1 Visit OT Evaluation $OT Eval Low Complexity: 1 Low  Naelani Lafrance, OTR/L  08/22/23, 1:15 PM   Chasady Longwell E Arriona Prest 08/22/2023, 1:13 PM

## 2023-08-22 NOTE — Care Management Important Message (Signed)
Important Message  Patient Details  Name: Leslie Parker MRN: [id number] Date of Birth: [birth year]   Important Message Given:  Yes - Medicare IM     Sherilyn Banker 08/22/2023, 12:37 PM

## 2023-08-22 NOTE — Progress Notes (Addendum)
Progress Note   Patient: Leslie Parker FAO:[id number] DOB: October 28, 1932 DOA: 08/19/2023     3 DOS: the patient was seen and examined on 08/22/2023      Brief hospital course: HPI on admission: "Leslie Parker is a 89 y.o. female with medical history significant of sCHF with EF 25-30%, SSS (s/p of PPM), A fib on Eliquis, HTN, HLD, DM, COPD, CKD-3b, osteopenia, recent ileus and possible diverticulitis, who presents with SOB   Patient was recently hospitalized from 01/20 - 1/24 due to ileus and possible diverticulitis.  Patient was treated with Zosyn and discharged on course of Augmentin.  Patient states that she finished antibiotic.  She has mild lower abdominal pain, but no nausea, vomiting, diarrhea today.  No fever or chills. Patient has shortness of breath and mild dry cough, no chest pain.  She has worsening bilateral leg edema.  Feeling sick recently with generalized weakness, poor appetite and decreased oral intake.   Data reviewed independently and ED Course: pt was found to have BNP> 4500, WBC 5.5, stable renal function, urinalysis (clear appearance, small amount of leukocyte, rare bacteria, WBC 6-10), temperature normal, blood pressure 170/86, heart rate 75, RR 18, oxygen saturation 97% on room air.  Patient is admitted to telemetry bed as inpatient.      Assessment and Plan:   Acute on chronic systolic CHF (congestive heart failure) [facility]): Patient has a 3+ leg edema, elevated BNP > 4500, consistent with CHF exacerbation.  2D echo 08/09/2023 showed EF 25 to 30%. --Continue Lasix 40 mg bid by IV Daily weights Continue to monitor input and output --As needed bronchodilators for shortness of breath   Pleural effusion due to CHF (congestive heart failure) [facility]): Patient has bilateral large pleural effusion. -Continue on IV Lasix, if no improvement, may consider thoracentesis.   COPD (chronic obstructive pulmonary disease) (HCC) --Bronchodilators and as needed Mucinex   HTN  (hypertension) --IV hydralazine as needed -- Continue Coreg, oral hydralazine, Imdur   Chronic a-fib (HCC): Heart rate controlled. Continue Eliquis and Coreg   CKD (chronic kidney disease) stage 4, GFR 15-29 ml/min [facility]): Renal function stable.   --Follow BMP's --Renally dose meds, avoid nephrotoxins & hypotension   Hypokalemia:  --K replacement ordered Continue replacement and monitoring   Type II diabetes mellitus with renal manifestations (HCC): Recent A1c 7.0.  Patient is taking Jardiance at home Continue insulin therapy   Recent history of Ileus Recent acute diverticulitis Diarrhea -resolved -Supportive care -Continue home tramadol and as needed Tylenol -GI panel negative   Overweight (BMI 25.0-29.9): B Counseled on weight loss when medically stable   Subjective:  Patient seen and examined at bedside this morning Denied worsening diarrhea abdominal pain chest pain or cough Did complain of some nausea today   Physical Exam: General exam: awake, alert, no acute distress HEENT: atraumatic, clear conjunctiva Respiratory system: CTAB diminished bases, no wheezes, Cardiovascular system: normal S1/S2, RRR, 3+ BLE pitting edema Gastrointestinal system: Nontender Central nervous system: A&O x 3. no gross focal neurologic deficits, normal speech Skin: dry, intact, normal temperature Psychiatry: normal mood, congruent affect, judgement and insight appear normal    Family Communication: None present. Will attempt to call as time allows.   Disposition: Status is: Inpatient Remains inpatient appropriate because: remains on IV lasix pending further improvement    Planned Discharge Destination: Home     Data Reviewed:    Latest Ref Rng & Units 08/22/2023    5:09 AM 08/21/2023    6:05 AM 08/20/2023  6:18 AM  BMP  Glucose 70 - 99 mg/dL 098  119  147   BUN 8 - 23 mg/dL 35  31  33   Creatinine 0.44 - 1.00 mg/dL 8.29  5.62  1.30   Sodium 135 - 145 mmol/L 134  136  138    Potassium 3.5 - 5.1 mmol/L 3.9  3.9  3.3   Chloride 98 - 111 mmol/L 102  105  106   CO2 22 - 32 mmol/L 24  24  22    Calcium 8.9 - 10.3 mg/dL 8.0  7.9  8.1      Vitals:   08/22/23 0539 08/22/23 0825 08/22/23 1141 08/22/23 1619  BP: (!) 111/59 139/68 127/74 132/65  Pulse: 64 64 63 60  Resp: 19 19 18 18   Temp: 98 F (36.7 C) 98.1 F (36.7 C) 98 F (36.7 C) 98.3 F (36.8 C)  TempSrc: Oral Oral    SpO2: 95% 93% 94% 94%  Weight:      Height:          Latest Ref Rng & Units 08/22/2023    5:09 AM 08/19/2023    3:57 PM 08/15/2023    9:46 AM  CBC  WBC 4.0 - 10.5 K/uL 7.1  5.5  5.1   Hemoglobin 12.0 - 15.0 g/dL 86.5  78.4  69.6   Hematocrit 36.0 - 46.0 % 31.1  37.6  32.9   Platelets 150 - 400 K/uL 198  256  175      Author: Loyce Dys, MD 08/22/2023 6:14 PM  For on call review [email or web address].

## 2023-08-23 DIAGNOSIS — I5023 Acute on chronic systolic (congestive) heart failure: Secondary | ICD-10-CM | POA: Diagnosis not present

## 2023-08-23 LAB — CBC WITH DIFFERENTIAL/PLATELET
Abs Immature Granulocytes: 0.05 10*3/uL (ref 0.00–0.07)
Basophils Absolute: 0 10*3/uL (ref 0.0–0.1)
Basophils Relative: 0 %
Eosinophils Absolute: 0 10*3/uL (ref 0.0–0.5)
Eosinophils Relative: 0 %
HCT: 30.3 % — ABNORMAL LOW (ref 36.0–46.0)
Hemoglobin: 10.2 g/dL — ABNORMAL LOW (ref 12.0–15.0)
Immature Granulocytes: 1 %
Lymphocytes Relative: 18 %
Lymphs Abs: 1.5 10*3/uL (ref 0.7–4.0)
MCH: 30.8 pg (ref 26.0–34.0)
MCHC: 33.7 g/dL (ref 30.0–36.0)
MCV: 91.5 fL (ref 80.0–100.0)
Monocytes Absolute: 0.6 10*3/uL (ref 0.1–1.0)
Monocytes Relative: 8 %
Neutro Abs: 6.2 10*3/uL (ref 1.7–7.7)
Neutrophils Relative %: 73 %
Platelets: 194 10*3/uL (ref 150–400)
RBC: 3.31 MIL/uL — ABNORMAL LOW (ref 3.87–5.11)
RDW: 17.2 % — ABNORMAL HIGH (ref 11.5–15.5)
WBC: 8.4 10*3/uL (ref 4.0–10.5)
nRBC: 0 % (ref 0.0–0.2)

## 2023-08-23 LAB — BASIC METABOLIC PANEL
Anion gap: 9 (ref 5–15)
BUN: 37 mg/dL — ABNORMAL HIGH (ref 8–23)
CO2: 27 mmol/L (ref 22–32)
Calcium: 7.9 mg/dL — ABNORMAL LOW (ref 8.9–10.3)
Chloride: 100 mmol/L (ref 98–111)
Creatinine, Ser: 2.26 mg/dL — ABNORMAL HIGH (ref 0.44–1.00)
GFR, Estimated: 20 mL/min — ABNORMAL LOW (ref >60)
Glucose, Bld: 105 mg/dL — ABNORMAL HIGH (ref 70–99)
Potassium: 3.5 mmol/L (ref 3.5–5.1)
Sodium: 136 mmol/L (ref 135–145)

## 2023-08-23 LAB — GLUCOSE, CAPILLARY
Glucose-Capillary: 96 mg/dL (ref 70–99)
Glucose-Capillary: 147 mg/dL — ABNORMAL HIGH (ref 70–99)

## 2023-08-23 NOTE — TOC Transition Note (Signed)
Transition of Care [facility]) - Discharge Note   Patient Details  Name: Leslie Parker MRN: [id number] Date of Birth: 08-06-1932  [facility] CM/SW Contact:  Truddie Hidden, RN Phone Number: 08/23/2023, 3:47 PM   Clinical Narrative:    Spoke with patient regarding discharge home today.  She was notified [facility] would contact her regarding resumption of care.     Final next level of care: Home w Home Health Services Barriers to Discharge: Barriers Resolved   Patient Goals and CMS Choice Patient states their goals for this hospitalization and ongoing recovery are:: Home CMS Medicare.gov Compare Post Acute Care list provided to:: Patient        Discharge Placement                       Discharge Plan and Services Additional resources added to the After Visit Summary for                            HH Arranged: RN, PT, OT [facility] Agency: [facility] Date [facility] Agency Contacted: 08/23/23 Time HH Agency Contacted: 1546 Representative spoke with at [facility] Agency: [place]  Social Drivers of Health (SDOH) Interventions SDOH Screenings   Food Insecurity: No Food Insecurity (08/20/2023)  Housing: Low Risk  (08/20/2023)  Transportation Needs: No Transportation Needs (08/20/2023)  Utilities: Not At Risk (08/20/2023)  Alcohol Screen: Low Risk  (08/20/2023)  Financial Resource Strain: Low Risk  (08/20/2023)  Social Connections: Moderately Integrated (08/20/2023)  Tobacco Use: Low Risk  (08/20/2023)     Readmission Risk Interventions    08/20/2023    3:45 PM 08/16/2023   12:42 PM 08/08/2023   11:36 AM  Readmission Risk Prevention Plan  Transportation Screening Complete Complete Complete  Medication Review Oceanographer) Complete Complete Complete  PCP or Specialist appointment within 3-5 days of discharge Complete  Complete  HRI or Home Care Consult Complete Complete Complete  SW Recovery Care/Counseling Consult Complete Complete Complete  Palliative Care  Screening Not Applicable Not Applicable Not Applicable  Skilled Nursing Facility Not Applicable Not Applicable Not Applicable

## 2023-08-23 NOTE — Plan of Care (Signed)

## 2023-08-23 NOTE — Plan of Care (Signed)

## 2023-08-23 NOTE — Progress Notes (Signed)
Discharge instructions and teaching provided by [facility]. All belongings packed and in tow. Patient waiting for family arrival to facilitate departure to private residence.

## 2023-08-23 NOTE — Discharge Summary (Signed)
Physician Discharge Summary   Patient: Leslie Parker MRN: [id number] DOB: 1933-04-05  Admit date:     08/19/2023  Discharge date: 08/23/23  Discharge Physician: Loyce Dys   PCP: [name], MD   Recommendations at discharge:  Follow-up with PCP  Discharge Diagnoses: Acute on chronic systolic CHF (congestive heart failure) [facility]):  Pleural effusion due to CHF (congestive heart failure) (HCC):  COPD (chronic obstructive pulmonary disease) (HCC) HTN (hypertension) Chronic a-fib (HCC): Heart rate controlled. CKD (chronic kidney disease) stage 4, GFR 15-29 ml/min (HCC):  Hypokalemia:  Type II diabetes mellitus with renal manifestations [facility]): Recent A1c 7.0.  Recent history of Ileus Recent acute diverticulitis Diarrhea -resolved Overweight (BMI 25.0-29.9):     Hospital Course: [name] is a 89 y.o. female with medical history significant of sCHF with EF 25-30%, SSS (s/p of PPM), A fib on Eliquis, HTN, HLD, DM, COPD, CKD-3b, osteopenia, recent ileus and possible diverticulitis, who presents with SOB Patient was recently hospitalized from 01/20 - 1/24 due to ileus and possible diverticulitis.  Patient was treated with Zosyn and discharged on course of Augmentin.  Patient states that she finished antibiotic.  Patient was found to have acute congestive heart failure exacerbation requiring IV diuresis.  Respiratory function improved patient able to work with PT with no significant distress.  Has been cleared for discharge today and to follow-up with PCP   Consultants: None Procedures performed: None Disposition: Home health Diet recommendation:  Cardiac diet DISCHARGE MEDICATION: Allergies as of 08/23/2023       Reactions   Baclofen Other (See Comments)   Elevated Cr   Simvastatin Other (See Comments)   Pt denies Elevated LFTs with simva 40mg , stopped and resolved (see MD note 11/10/13)        Medication List     STOP taking these medications     amoxicillin-clavulanate 500-125 MG tablet Commonly known as: AUGMENTIN       TAKE these medications    albuterol 108 (90 Base) MCG/ACT inhaler Commonly known as: VENTOLIN HFA Inhale 2 puffs into the lungs every 6 (six) hours as needed.   amiodarone 200 MG tablet Commonly known as: PACERONE Take 200 mg by mouth daily.   apixaban 2.5 MG Tabs tablet Commonly known as: ELIQUIS Take 1 tablet (2.5 mg total) by mouth 2 (two) times daily.   ascorbic acid 250 MG Chew Commonly known as: VITAMIN C Chew 250 mg by mouth daily.   B-12 2500 MCG Tabs Take 2,500 mcg by mouth daily.   carvedilol 6.25 MG tablet Commonly known as: COREG Take 1 tablet (6.25 mg total) by mouth 2 (two) times daily with a meal.   colestipol 1 g tablet Commonly known as: COLESTID Take 1 tablet by mouth 2 (two) times daily.   empagliflozin 10 MG Tabs tablet Commonly known as: JARDIANCE Take 1 tablet (10 mg total) by mouth daily.   furosemide 40 MG tablet Commonly known as: LASIX Take 1 tablet (40 mg total) by mouth in the morning.   hydrALAZINE 50 MG tablet Commonly known as: APRESOLINE Take 1 tablet (50 mg total) by mouth 3 (three) times daily.   ipratropium-albuterol 0.5-2.5 (3) MG/3ML Soln Commonly known as: DUONEB Inhale 3 mLs into the lungs every 6 (six) hours as needed.   isosorbide mononitrate 30 MG 24 hr tablet Commonly known as: IMDUR Take 1 tablet (30 mg total) by mouth at bedtime.   Magnesium Oxide -Mg Supplement 500 MG Caps Take 500 mg by mouth daily.  multivitamin with minerals Tabs tablet Take 1 tablet by mouth daily. One-A-Day Women's Vitamin   omeprazole 20 MG capsule Commonly known as: PRILOSEC Take 20 mg by mouth every morning.   ondansetron 4 MG disintegrating tablet Commonly known as: ZOFRAN-ODT Take 4 mg by mouth every 8 (eight) hours as needed.   traMADol 50 MG tablet Commonly known as: ULTRAM Take 1 tablet by mouth every 6 (six) hours as needed.   traZODone  100 MG tablet Commonly known as: DESYREL Take 100 mg by mouth at bedtime.   Trelegy Ellipta 100-62.5-25 MCG/ACT Aepb Generic drug: Fluticasone-Umeclidin-Vilant Inhale 1 puff into the lungs daily.   Vitamin D-1000 Max St 25 MCG (1000 UT) tablet Generic drug: Cholecalciferol Take 1,000 Units by mouth daily.        Follow-up Information     [facility] Follow up.   Specialty: Cardiology Contact information: [address] [place] [address] [phone]        Alwyn Pea, MD Follow up.   Specialties: Cardiology, Internal Medicine Why: Appointment scheduled for heart failure clinic on 08/27/23 at 10:30 AM, will have pacemaker check prior to this at 10 AM. Contact information: [address] [place] [address] [phone]                Discharge Exam: Ceasar Mons Weights   08/19/23 1555 08/23/23 0453  Weight: 88.2 kg 90.4 kg   General exam: awake, alert, no acute distress HEENT: atraumatic, clear conjunctiva Respiratory system: CTAB diminished bases, no wheezes, Cardiovascular system: normal S1/S2, RRR, lower extremity edema improved Gastrointestinal system: Nontender Central nervous system: A&O x 3. no gross focal neurologic deficits, normal speech Skin: dry, intact, normal temperature Psychiatry: normal mood, congruent affect, judgement and insight appear normal  Condition at discharge: good  The results of significant diagnostics from this hospitalization (including imaging, microbiology, ancillary and laboratory) are listed below for reference.   Imaging Studies: CT ABDOMEN PELVIS WO CONTRAST Result Date: 08/19/2023 CLINICAL DATA:  Acute abdominal pain EXAM: CT ABDOMEN AND PELVIS WITHOUT CONTRAST TECHNIQUE: Multidetector CT imaging of the abdomen and pelvis was performed following the standard protocol without IV contrast. RADIATION DOSE REDUCTION: This exam was performed  according to the departmental dose-optimization program which includes automated exposure control, adjustment of the mA and/or kV according to patient size and/or use of iterative reconstruction technique. COMPARISON:  08/12/2023 FINDINGS: Lower chest: Large bilateral pleural effusions are again identified. Lower lobe atelectasis is noted and stable. Hepatobiliary: Gallbladder has been surgically removed. Liver is within normal limits. Prominence of the biliary tree is seen consistent with the post cholecystectomy state. Pancreas: Diffusely atrophic. The cystic lesion adjacent to the mid body of the pancreas is again identified and stable. Spleen: Stable hypodensity is noted within the spleen unchanged from the prior exam. Adrenals/Urinary Tract: Adrenal glands are within normal limits. Bilateral renal cystic change is noted stable from the prior study. Some of these are hyperdense in nature. No obstructive changes are seen. No renal calculi are noted. The bladder is well distended. Stomach/Bowel: Scattered diverticular change of the colon is noted without discrete diverticulitis. The more proximal shows fluid within as well as gas. No obstructive changes are noted. The colon is less distended when compared with the prior exam. The appendix is within normal limits. Small bowel and stomach are unremarkable. Changes of prior fundoplication are again seen. Vascular/Lymphatic: Aortic atherosclerosis. No enlarged abdominal or pelvic lymph nodes. Reproductive: Status post  hysterectomy. No adnexal masses. Other: No abdominal wall hernia or abnormality. No abdominopelvic ascites. Stable metallic density is noted along the ascending colon noted on multiple previous exams. Musculoskeletal: Hip replacement and right hip pending are noted stable from the prior exam. Degenerative change of the lumbar spine is noted. No acute rib abnormality is seen. IMPRESSION: Large bilateral pleural effusions with lower lobe atelectasis.  Stable peripancreatic cyst. Follow-up can be performed as previously described. Diverticulosis without diverticulitis. The degree of colonic dilatation has improved in the interval. Electronically Signed   By: Alcide Clever M.D.   On: 08/19/2023 21:53   CT ABDOMEN PELVIS WO CONTRAST Result Date: 08/12/2023 CLINICAL DATA:  Nausea and weakness.  Bowel obstruction suspected. EXAM: CT ABDOMEN AND PELVIS WITHOUT CONTRAST TECHNIQUE: Multidetector CT imaging of the abdomen and pelvis was performed following the standard protocol without IV contrast. RADIATION DOSE REDUCTION: This exam was performed according to the departmental dose-optimization program which includes automated exposure control, adjustment of the mA and/or kV according to patient size and/or use of iterative reconstruction technique. COMPARISON:  02/19/2013 FINDINGS: Lower chest: Dependent atelectasis noted in both lower lobes. Moderate bilateral pleural effusions. Hepatobiliary: No focal mass lesion identified in the liver. Gallbladder surgically absent. Intra and extrahepatic biliary duct dilatation is progressive in the interval with common bile duct measuring up to 19 mm diameter. Pancreas: Pancreas is diffusely atrophic without main duct dilatation. 3.9 x 3.0 cm well-defined homogeneous fluid density structure along the tail of the pancreas (23/2) is new in the interval and may be an exophytic cyst from the pancreatic tail. Immediately adjacent to the splenic artery, splenic artery aneurysm is not excluded but considered much less likely. Spleen: 19 mm hypoattenuating lesion in the dome of the spleen cannot be definitively characterized but is likely benign. Adrenals/Urinary Tract: No adrenal nodule or mass. Multiple cysts of varying size and attenuation are noted in both kidneys measuring up to 9 cm on the right and 5 cm on the left. 2.3 cm lesion interpolar right kidney has attenuation too high to be a simple cyst. 2.3 cm exophytic lesion upper  pole left kidney has heterogeneous attenuation. There is a 2.1 cm lesion in the interpolar left kidney with attenuation too high to be a simple cyst. Additional high density lesions are seen in both kidneys. No hydronephrosis. No evidence for hydroureter. Bladder largely obscured by beam hardening artifact from orthopedic hardware in both hips. Stomach/Bowel: Surgical changes in the esophagogastric junction are compatible with prior fundoplication. Duodenum is normally positioned as is the ligament of Treitz. No small bowel wall thickening. No small bowel dilatation. The terminal ileum is normal. Tiny appendicolith noted with otherwise normal appendix. Right, transverse, and descending colon are diffusely distended with gas and fluid. Advanced diverticular disease is seen in the nondilated sigmoid colon. Wall thickening in the sigmoid segment the colon is likely related to the diverticulosis. Possible subtle pericolonic edema along the proximal sigmoid segment (axial 60/2 and coronal 35/5). No abrupt transition in the left colon although luminal narrowing is seen in the sigmoid segment. Vascular/Lymphatic: There is moderate atherosclerotic calcification of the abdominal aorta without aneurysm. There is no gastrohepatic or hepatoduodenal ligament lymphadenopathy. No retroperitoneal or mesenteric lymphadenopathy. No pelvic sidewall lymphadenopathy. Reproductive: There is no adnexal mass. Other: Trace free fluid is seen in the left paracolic gutter and in the right pelvis. Musculoskeletal: Mild body wall edema evident. Fixation hardware noted proximal right femur with left hip prosthesis evident. Bones are diffusely demineralized. Diffuse degenerative disc  disease noted lumbar spine with chronic compression deformity noted at the T12 level. IMPRESSION: 1. Right, transverse, and descending colon are diffusely distended with gas and fluid. No abrupt transition in the left colon although luminal narrowing is seen in the  sigmoid segment where there is advanced diverticular disease in wall thickening likely secondary to the diverticulosis. Diverticular stricture not excluded. Possible subtle pericolonic edema in the proximal sigmoid segment raising the question of diverticulitis. Imaging features are compatible with colonic ileus. 2. 3.9 x 3.0 cm well-defined homogeneous fluid density structure along the tail of the pancreas is new in the interval and may be an exophytic cyst from the pancreatic tail. Immediately adjacent to the splenic artery, splenic artery aneurysm is not excluded but considered much less likely. MRI abdomen with and without contrast recommended to further evaluate. 3. Moderate bilateral pleural effusions with dependent atelectasis in both lower lobes. 4. Multiple cysts of varying size and attenuation in both kidneys. There are a few lesions in both kidneys which have attenuation too high to be simple cysts. These could also be further evaluated at the time of abdominal MRI. 5. Progressive intra and extrahepatic biliary duct dilatation status post cholecystectomy. Correlation with liver function test recommended. 6.  Aortic Atherosclerosis (ICD10-I70.0). Electronically Signed   By: Kennith Center M.D.   On: 08/12/2023 06:25   DG Chest Port 1 View Result Date: 08/10/2023 CLINICAL DATA:  89 year old female history of acute on chronic diastolic congestive heart failure. EXAM: PORTABLE CHEST 1 VIEW COMPARISON:  Chest x-ray 08/07/2023. FINDINGS: Left-sided pacemaker device in place with lead tips projecting over the expected location of the right atrium and right ventricle. Lung volumes are normal. Bibasilar opacities are noted (left-greater-than-right) which may reflect areas of atelectasis and/or consolidation. Small right and moderate left pleural effusions. Diffuse interstitial prominence and peribronchial cuffing. Cephalization of the pulmonary vasculature. Mild cardiomegaly. Upper mediastinal contours are  within normal limits. Atherosclerotic calcifications are noted in the thoracic aorta. Status post right shoulder arthroplasty. IMPRESSION: 1. The appearance the chest is most compatible with acute congestive heart failure, as above. 2. Aortic atherosclerosis. Electronically Signed   By: Trudie Reed M.D.   On: 08/10/2023 11:33   ECHOCARDIOGRAM COMPLETE Result Date: 08/09/2023    ECHOCARDIOGRAM REPORT   Patient Name:   ODESTER NILSON Kauk Date of Exam: 08/09/2023 Medical Rec #:  [id number]     Height:       69.0 in Accession #:    [id number]    Weight:       194.5 lb Date of Birth:  1933-03-05      BSA:          2.042 m Patient Age:    89 years      BP:           172/81 mmHg Patient Gender: F             HR:           62 bpm. Exam Location:  [facility] Procedure: 2D Echo, 3D Echo, Cardiac Doppler and Color Doppler Indications:     CHF  History:         Patient has prior history of Echocardiogram examinations, most                  recent 02/09/2023. CHF, Abnormal ECG, COPD, Arrythmias:Atrial                  Fibrillation, Signs/Symptoms:Dizziness/Lightheadedness; Risk  Factors:Hypertension and Diabetes. CKD.  Sonographer:     Mikki Harbor Referring Phys:  [id number] ERIC CHEN Diagnosing Phys: Debbe Odea MD IMPRESSIONS  1. Left ventricular ejection fraction, by estimation, is 25 to 30%. Left ventricular ejection fraction by 2D MOD biplane is 26.6 %. The left ventricle has severely decreased function. The left ventricle demonstrates global hypokinesis. There is mild left ventricular hypertrophy. Left ventricular diastolic parameters are indeterminate.  2. Right ventricular systolic function is normal. The right ventricular size is normal. There is mildly elevated pulmonary artery systolic pressure.  3. Left atrial size was moderately dilated.  4. The mitral valve is degenerative. Moderate mitral valve regurgitation.  5. Tricuspid valve regurgitation is mild to moderate.  6. The aortic valve is calcified.  Aortic valve regurgitation is not visualized. Aortic valve sclerosis/calcification is present, without any evidence of aortic stenosis. Aortic valve mean gradient measures 8.5 mmHg.  7. The inferior vena cava is dilated in size with >50% respiratory variability, suggesting right atrial pressure of 8 mmHg. FINDINGS  Left Ventricle: Left ventricular ejection fraction, by estimation, is 25 to 30%. Left ventricular ejection fraction by 2D MOD biplane is 26.6 %. The left ventricle has severely decreased function. The left ventricle demonstrates global hypokinesis. The left ventricular internal cavity size was normal in size. There is mild left ventricular hypertrophy. Left ventricular diastolic parameters are indeterminate. Right Ventricle: The right ventricular size is normal. No increase in right ventricular wall thickness. Right ventricular systolic function is normal. There is mildly elevated pulmonary artery systolic pressure. The tricuspid regurgitant velocity is 2.81  m/s, and with an assumed right atrial pressure of 8 mmHg, the estimated right ventricular systolic pressure is 39.6 mmHg. Left Atrium: Left atrial size was moderately dilated. Right Atrium: Right atrial size was normal in size. Pericardium: There is no evidence of pericardial effusion. Mitral Valve: The mitral valve is degenerative in appearance. Moderate mitral valve regurgitation. MV peak gradient, 8.4 mmHg. The mean mitral valve gradient is 3.0 mmHg. Tricuspid Valve: The tricuspid valve is normal in structure. Tricuspid valve regurgitation is mild to moderate. Aortic Valve: The aortic valve is calcified. Aortic valve regurgitation is not visualized. Aortic valve sclerosis/calcification is present, without any evidence of aortic stenosis. Aortic valve mean gradient measures 8.5 mmHg. Aortic valve peak gradient measures 18.0 mmHg. Pulmonic Valve: The pulmonic valve was not well visualized. Pulmonic valve regurgitation is not visualized. Aorta: The  aortic root is normal in size and structure. Venous: The inferior vena cava is dilated in size with greater than 50% respiratory variability, suggesting right atrial pressure of 8 mmHg. IAS/Shunts: No atrial level shunt detected by color flow Doppler.  LEFT VENTRICLE PLAX 2D                        Biplane EF (MOD) LVIDd:         5.00 cm         LV Biplane EF:   Left LVIDs:         3.70 cm                          ventricular LV PW:         1.20 cm                          ejection LV IVS:        1.50 cm  fraction by                                                 2D MOD                                                 biplane is LV Volumes (MOD)                                26.6 %. LV vol d, MOD    108.0 ml A2C:                           Diastology LV vol d, MOD    105.0 ml      LV e' medial:    3.48 cm/s A4C:                           LV E/e' medial:  19.4 LV vol s, MOD    79.4 ml       LV e' lateral:   9.79 cm/s A2C:                           LV E/e' lateral: 6.9 LV vol s, MOD    76.4 ml A4C: LV SV MOD A2C:   28.6 ml LV SV MOD A4C:   105.0 ml LV SV MOD BP:    28.3 ml RIGHT VENTRICLE RV Basal diam:  3.55 cm RV Mid diam:    3.00 cm RV S prime:     23.70 cm/s TAPSE (M-mode): 1.9 cm LEFT ATRIUM             Index        RIGHT ATRIUM           Index LA diam:        4.10 cm 2.01 cm/m   RA Area:     15.10 cm LA Vol (A2C):   87.3 ml 42.76 ml/m  RA Volume:   41.20 ml  20.18 ml/m LA Vol (A4C):   86.1 ml 42.17 ml/m LA Biplane Vol: 89.0 ml 43.59 ml/m  AORTIC VALVE                    PULMONIC VALVE AV Vmax:           212.00 cm/s  PV Vmax:       1.04 m/s AV Vmean:          127.500 cm/s PV Peak grad:  4.3 mmHg AV VTI:            0.420 m AV Peak Grad:      18.0 mmHg AV Mean Grad:      8.5 mmHg LVOT Vmax:         83.00 cm/s LVOT Vmean:        48.900 cm/s LVOT VTI:          0.173 m LVOT/AV VTI ratio: 0.41 MITRAL VALVE                TRICUSPID  VALVE MV Area (PHT): 6.02 cm     TR Peak grad:   31.6 mmHg  MV Peak grad:  8.4 mmHg     TR Vmax:        281.00 cm/s MV Mean grad:  3.0 mmHg MV Vmax:       1.45 m/s     SHUNTS MV Vmean:      75.6 cm/s    Systemic VTI: 0.17 m MV Decel Time: 126 msec MV E velocity: 67.60 cm/s MV A velocity: 124.00 cm/s MV E/A ratio:  0.55 Debbe Odea MD Electronically signed by Debbe Odea MD Signature Date/Time: 08/09/2023/4:34:38 PM    Final    DG Chest Port 1 View Result Date: 08/07/2023 CLINICAL DATA:  [id number] Sepsis (HCC) [id number]. Respiratory distress. Difficulty breathing. EXAM: PORTABLE CHEST 1 VIEW COMPARISON:  06/16/2023. FINDINGS: Low lung volume. Redemonstration of left retrocardiac airspace opacity obscuring the left hemidiaphragm, descending thoracic aorta and blunting the left lateral costophrenic angle, suggesting combination of left lung atelectasis and/or consolidation with pleural effusion. There is interval worsening since the prior study. There is also probable small layering right pleural effusion. There is mild diffuse pulmonary vascular congestion. No pneumothorax. Stable cardio-mediastinal silhouette. There is a left sided 2-lead pacemaker. No acute osseous abnormalities. Right reverse shoulder arthroplasty noted. The soft tissues are within normal limits. IMPRESSION: *Findings favor congestive heart failure/pulmonary edema. *There is worsening left retrocardiac opacity, as described above. Electronically Signed   By: Jules Schick M.D.   On: 08/07/2023 15:40    Microbiology: Results for orders placed or performed during the hospital encounter of 08/07/23  Culture, blood (Routine x 2)     Status: None   Collection Time: 08/07/23 12:48 PM   Specimen: BLOOD  Result Value Ref Range Status   Specimen Description   Final    BLOOD BLOOD RIGHT ARM Performed at [facility], [address], [place], [place] [address]    Special Requests   Final    BOTTLES DRAWN AEROBIC ONLY Blood Culture results may not be optimal due to an inadequate  volume of blood received in culture bottles Performed at [facility], [address] [place], [place] [address]    Culture   Final    NO GROWTH 5 DAYS Performed at [facility], [address], [place], [place] [address]    Report Status 08/12/2023 FINAL  Final  Resp panel by RT-PCR (RSV, Flu A&B, Covid) Anterior Nasal Swab     Status: None   Collection Time: 08/07/23 12:48 PM   Specimen: Anterior Nasal Swab  Result Value Ref Range Status   SARS Coronavirus 2 by RT PCR NEGATIVE NEGATIVE Final    Comment: (NOTE) SARS-CoV-2 target nucleic acids are NOT DETECTED.  The SARS-CoV-2 RNA is generally detectable in upper respiratory specimens during the acute phase of infection. The lowest concentration of SARS-CoV-2 viral copies this assay can detect is 138 copies/mL. A negative result does not preclude SARS-Cov-2 infection and should not be used as the sole basis for treatment or other patient management decisions. A negative result may occur with  improper specimen collection/handling, submission of specimen other than nasopharyngeal swab, presence of viral mutation(s) within the areas targeted by this assay, and inadequate number of viral copies(<138 copies/mL). A negative result must be combined with clinical observations, patient history, and epidemiological information. The expected result is Negative.  Fact Sheet for Patients:  BloggerCourse.com  Fact Sheet for Healthcare Providers:  SeriousBroker.it  This test  is no t yet approved or cleared by the [place] and  has been authorized for detection and/or diagnosis of SARS-CoV-2 by FDA under an Emergency Use Authorization (EUA). This EUA will remain  in effect (meaning this test can be used) for the duration of the COVID-19 declaration under Section 564(b)(1) of the Act, 21 U.S.C.section 360bbb-3(b)(1), unless the authorization is terminated  or revoked  sooner.       Influenza A by PCR NEGATIVE NEGATIVE Final   Influenza B by PCR NEGATIVE NEGATIVE Final    Comment: (NOTE) The Xpert Xpress SARS-CoV-2/FLU/RSV plus assay is intended as an aid in the diagnosis of influenza from Nasopharyngeal swab specimens and should not be used as a sole basis for treatment. Nasal washings and aspirates are unacceptable for Xpert Xpress SARS-CoV-2/FLU/RSV testing.  Fact Sheet for Patients: BloggerCourse.com  Fact Sheet for Healthcare Providers: SeriousBroker.it  This test is not yet approved or cleared by the [place] FDA and has been authorized for detection and/or diagnosis of SARS-CoV-2 by FDA under an Emergency Use Authorization (EUA). This EUA will remain in effect (meaning this test can be used) for the duration of the COVID-19 declaration under Section 564(b)(1) of the Act, 21 U.S.C. section 360bbb-3(b)(1), unless the authorization is terminated or revoked.     Resp Syncytial Virus by PCR NEGATIVE NEGATIVE Final    Comment: (NOTE) Fact Sheet for Patients: BloggerCourse.com  Fact Sheet for Healthcare Providers: SeriousBroker.it  This test is not yet approved or cleared by the [place] FDA and has been authorized for detection and/or diagnosis of SARS-CoV-2 by FDA under an Emergency Use Authorization (EUA). This EUA will remain in effect (meaning this test can be used) for the duration of the COVID-19 declaration under Section 564(b)(1) of the Act, 21 U.S.C. section 360bbb-3(b)(1), unless the authorization is terminated or revoked.  Performed at [facility], [address] [place], [place] [address]   Culture, blood (Routine x 2)     Status: None   Collection Time: 08/07/23  1:17 PM   Specimen: BLOOD  Result Value Ref Range Status   Specimen Description   Final    BLOOD BLOOD LEFT ARM Performed at [facility], [address], [place], [place] [address]    Special Requests   Final    BOTTLES DRAWN AEROBIC AND ANAEROBIC Blood Culture results may not be optimal due to an inadequate volume of blood received in culture bottles Performed at [facility], [address] [place], [place] [address]    Culture   Final    NO GROWTH 5 DAYS Performed at [facility], [address], [place], [place] [address]    Report Status 08/12/2023 FINAL  Final  Gastrointestinal Panel by PCR , Stool     Status: None   Collection Time: 08/10/23 10:40 AM   Specimen: Stool  Result Value Ref Range Status   Campylobacter species NOT DETECTED NOT DETECTED Final   Plesimonas shigelloides NOT DETECTED NOT DETECTED Final   Salmonella species NOT DETECTED NOT DETECTED Final   Yersinia enterocolitica NOT DETECTED NOT DETECTED Final   Vibrio species NOT DETECTED NOT DETECTED Final   Vibrio cholerae NOT DETECTED NOT DETECTED Final   Enteroaggregative E coli (EAEC) NOT DETECTED NOT DETECTED Final   Enteropathogenic E coli (EPEC) NOT DETECTED NOT DETECTED Final   Enterotoxigenic E coli (ETEC) NOT DETECTED NOT DETECTED Final   Shiga like toxin producing E coli (STEC) NOT DETECTED NOT DETECTED  Final   Shigella/Enteroinvasive E coli (EIEC) NOT DETECTED NOT DETECTED Final   Cryptosporidium NOT DETECTED NOT DETECTED Final   Cyclospora cayetanensis NOT DETECTED NOT DETECTED Final   Entamoeba histolytica NOT DETECTED NOT DETECTED Final   Giardia lamblia NOT DETECTED NOT DETECTED Final   Adenovirus F40/41 NOT DETECTED NOT DETECTED Final   Astrovirus NOT DETECTED NOT DETECTED Final   Norovirus GI/GII NOT DETECTED NOT DETECTED Final   Rotavirus A NOT DETECTED NOT DETECTED Final   Sapovirus (I, II, IV, and V) NOT DETECTED NOT DETECTED Final    Comment: Performed at [facility], [address] [place], [place] [address]    Labs: CBC: Recent Labs  Lab 08/19/23 1557 08/22/23 0509  08/23/23 0521  WBC 5.5 7.1 8.4  NEUTROABS 4.1 4.9 6.2  HGB 12.3 10.3* 10.2*  HCT 37.6 31.1* 30.3*  MCV 92.2 91.2 91.5  PLT 256 198 194   Basic Metabolic Panel: Recent Labs  Lab 08/19/23 1557 08/20/23 0618 08/21/23 0605 08/22/23 0509 08/23/23 0521  NA 135 138 136 134* 136  K 3.2* 3.3* 3.9 3.9 3.5  CL 103 106 105 102 100  CO2 21* 22 24 24 27   GLUCOSE 176* 113* 124* 120* 105*  BUN 38* 33* 31* 35* 37*  CREATININE 2.37* 2.26* 2.17* 2.30* 2.26*  CALCIUM 8.4* 8.1* 7.9* 8.0* 7.9*  MG 1.9  --   --   --   --   PHOS 3.0  --   --   --   --    Liver Function Tests: Recent Labs  Lab 08/19/23 1557  AST 31  ALT 29  ALKPHOS 95  BILITOT 0.7  PROT 5.8*  ALBUMIN 2.9*   CBG: Recent Labs  Lab 08/22/23 1621 08/22/23 2102 08/22/23 2159 08/23/23 0827 08/23/23 1228  GLUCAP 126* 112* 115* 96 147*    Discharge time spent:  34 minutes.  Signed: Loyce Dys, MD [facility] 08/23/2023

## 2023-08-25 DIAGNOSIS — N184 Chronic kidney disease, stage 4 (severe): Secondary | ICD-10-CM | POA: Diagnosis not present

## 2023-08-25 DIAGNOSIS — E1122 Type 2 diabetes mellitus with diabetic chronic kidney disease: Secondary | ICD-10-CM | POA: Diagnosis not present

## 2023-08-25 DIAGNOSIS — I110 Hypertensive heart disease with heart failure: Secondary | ICD-10-CM | POA: Diagnosis not present

## 2023-08-25 DIAGNOSIS — J4489 Other specified chronic obstructive pulmonary disease: Secondary | ICD-10-CM | POA: Diagnosis not present

## 2023-08-25 DIAGNOSIS — N179 Acute kidney failure, unspecified: Secondary | ICD-10-CM | POA: Diagnosis not present

## 2023-08-25 DIAGNOSIS — I5033 Acute on chronic diastolic (congestive) heart failure: Secondary | ICD-10-CM | POA: Diagnosis not present

## 2023-08-25 DIAGNOSIS — I480 Paroxysmal atrial fibrillation: Secondary | ICD-10-CM | POA: Diagnosis not present

## 2023-08-25 DIAGNOSIS — D631 Anemia in chronic kidney disease: Secondary | ICD-10-CM | POA: Diagnosis not present

## 2023-08-25 DIAGNOSIS — J9601 Acute respiratory failure with hypoxia: Secondary | ICD-10-CM | POA: Diagnosis not present

## 2023-08-27 ENCOUNTER — Encounter: Payer: Medicare HMO | Admitting: Family

## 2023-08-27 DIAGNOSIS — Z950 Presence of cardiac pacemaker: Secondary | ICD-10-CM | POA: Diagnosis not present

## 2023-08-27 DIAGNOSIS — I10 Essential (primary) hypertension: Secondary | ICD-10-CM | POA: Diagnosis not present

## 2023-08-27 DIAGNOSIS — E1122 Type 2 diabetes mellitus with diabetic chronic kidney disease: Secondary | ICD-10-CM | POA: Diagnosis not present

## 2023-08-27 DIAGNOSIS — I5022 Chronic systolic (congestive) heart failure: Secondary | ICD-10-CM | POA: Diagnosis not present

## 2023-08-27 DIAGNOSIS — N183 Chronic kidney disease, stage 3 unspecified: Secondary | ICD-10-CM | POA: Diagnosis not present

## 2023-08-27 DIAGNOSIS — I495 Sick sinus syndrome: Secondary | ICD-10-CM | POA: Diagnosis not present

## 2023-08-27 NOTE — Progress Notes (Signed)
 Established Patient Visit   Chief Complaint: No chief complaint on file.  Date of Service: 08/27/2023 Date of Birth: 05/16/1933 PCP: Sherial Bail, MD  History of Present Illness: Leslie Parker is a 89 y.o.female patient who history of hypertension who was recently hospitalized for diverticulitis.  She also has bradycardia with a existing permanent pacemaker.  During the hospitalization for diverticulitis she was found to have a decrease in ejection fraction with a left ventricular EF of 30% and left ventricular dimension of 5 cm.  There was moderate mitral regurgitation.  She responded well to diuresis and there was some modifications made in her medical regimen.     She reports feeling quite well without any dyspnea.  She is able to form her ADLs without difficulty and does not have any syncope or presyncope.  There is been no chest pain syndrome.  She does have lower extremity edema but this is generally well-controlled with Lasix .     Patient recently had pacemaker revision May 2024.  Reportedly had poor lead position original implant was April 2024  Past Medical and Surgical History  Past Medical History Past Medical History:  Diagnosis Date  . Asthma, unspecified asthma severity, unspecified whether complicated, unspecified whether persistent (HHS-HCC)   . COPD (chronic obstructive pulmonary disease) (CMS/HHS-HCC)   . Diabetes mellitus without complication (CMS/HHS-HCC)   . Hypertension   . Hypoxia   . Osteoarthritis   . Osteopenia     Past Surgical History She has a past surgical history that includes cholecystectomy; Hysterectomy Vaginal; Cholecystectomy; Hernia repair; Total shoulder replacement (Left); Replacement total knee (Left); Replacement total knee (Right); Replacement total hip w/  resurfacing implants (Left); Insert / replace / remove pacemaker (11/15/2022); and Reduction and internal fixation of displaced intertrochanteric right hip fracture with Bi[employer] Affixis TFN nail  (Right, 02/13/2023).   Medications and Allergies  Current Medications  Current Outpatient Medications  Medication Sig Dispense Refill  . ACCU-CHEK GUIDE TEST STRIPS test strip TEST BLOOD SUGAR THREE TIMES DAILY USE AS INSTRUCTED 300 strip 10  . ACCU-CHEK SOFTCLIX LANCETS lancets TEST THREE TIMES DAILY  AS  INSTRUCTED 300 each 3  . albuterol  90 mcg/actuation inhaler Inhale 2 inhalations into the lungs every 6 (six) hours as needed for Wheezing or Shortness of Breath 3 each 2  . AMIOdarone  (PACERONE ) 200 MG tablet TAKE 1 TABLET BY MOUTH EVERY DAY 90 tablet 0  . apixaban  (ELIQUIS ) 2.5 mg tablet Take 1 tablet (2.5 mg total) by mouth 2 (two) times daily 180 tablet 3  . ascorbic acid , vitamin C , (VITAMIN C ) 1000 MG tablet Take 1,000 mg by mouth once daily    . blood glucose meter kit Use as directed 1 each 0  . blood sugar diagnostic, drum (BLOOD GLUCOSE DIAGNOSTIC, DRUM) test strip Use 3 (three) times daily Use as instructed. 51 each 3  . blood-glucose calibrat control Cmpk Use as directed. 1 each 5  . carvediloL  (COREG ) 6.25 MG tablet TAKE 1 TABLET BY MOUTH 2 TIMES DAILY WITH A MEAL.    . cholecalciferol  (VITAMIN D3) 1000 unit tablet Take 1,000 Units by mouth once daily    . cyanocobalamin  (VITAMIN B12) 1000 MCG tablet Take 1,000 mcg by mouth once daily    . dilTIAZem  (CARDIZEM  CD) 360 MG CD capsule TAKE 1 CAPSULE (360 MG TOTAL) BY MOUTH ONCE DAILY FOR 360 DAYS 30 capsule 11  . fluticasone -umeclidinium-vilanterol (TRELEGY ELLIPTA) 100-62.5-25 mcg inhaler Inhale 1 Puff into the lungs once daily 60 each 5  . FUROsemide  (  LASIX ) 20 MG tablet Take 1 tablet (20 mg total) by mouth once daily as needed for Edema 90 tablet 1  . glipiZIDE  (GLUCOTROL ) 2.5 MG XL tablet take 1 tablet every day 90 tablet 1  . hydrALAZINE  (APRESOLINE ) 25 MG tablet TAKE 1 TABLET (25 MG TOTAL) BY MOUTH 3 (THREE) TIMES DAILY AS NEEDED (FOR SBP >150 CONISTENTLY OR DBP>100 PERSISTENTLY) 270 tablet 1  . isosorbide  mononitrate (IMDUR )  30 MG ER tablet Take 1 tablet by mouth once daily    . JARDIANCE  10 mg tablet Take 1 tablet by mouth once daily    . multivitamin tablet Take 1 tablet by mouth once daily    . omeprazole (PRILOSEC) 40 MG DR capsule Take 1 capsule (40 mg total) by mouth 2 (two) times daily before meals 180 capsule 1  . ondansetron  (ZOFRAN -ODT) 4 MG disintegrating tablet Take 4 mg by mouth every 8 (eight) hours as needed    . traMADoL  (ULTRAM ) 50 mg tablet Take 50 mg by mouth every 6 (six) hours as needed for Pain    . traZODone  (DESYREL ) 100 MG tablet TAKE 1 TABLET EVERY DAY AT NIGHT 90 tablet 3  . amoxicillin -clavulanate (AUGMENTIN ) 500-125 mg tablet Take 1 tablet by mouth 2 (two) times daily (Patient not taking: Reported on 08/27/2023)    . colestipoL  (COLESTID ) 1 gram tablet take 1 tablet by mouth 2 times daily. 180 tablet 1  . ipratropium-albuteroL  (DUO-NEB) nebulizer solution TAKE 3 MLS BY NEBULIZATION 3 (THREE) TIMES DAILY FOR 30 DAYS 270 mL 0  . methocarbamoL  (ROBAXIN ) 500 MG tablet Take 500 mg by mouth every 8 (eight) hours as needed (Patient not taking: Reported on 08/27/2023)    . metoprolol  tartrate (LOPRESSOR ) 50 MG tablet Take 1 tablet (50 mg total) by mouth 2 (two) times daily (Patient not taking: Reported on 08/27/2023) 180 tablet 3   No current facility-administered medications for this visit.    Allergies: Baclofen and Simvastatin  Social and Family History  Social History  reports that she has never smoked. She has never used smokeless tobacco. She reports that she does not currently use alcohol. She reports that she does not use drugs.  Family History Family History  Problem Relation Name Age of Onset  . High blood pressure (Hypertension) Mother    . Heart failure Mother    . Emphysema Father    . No Known Problems Sister    . Stroke Brother    . No Known Problems Sister      Review of Systems   Review of Systems: The patient denies chest pain, shortness of breath, orthopnea, paroxysmal  nocturnal dyspnea,  palpitations, heart racing, presyncope, syncope. Review of 10 Systems is negative except as described above.  Physical Examination   Vitals:BP (!) 150/74   Pulse 63   Ht 175.3 cm (5' 9)   Wt 77.1 kg (170 lb)   SpO2 96%   BMI 25.10 kg/m  Ht:175.3 cm (5' 9) Wt:77.1 kg (170 lb) ADJ:Anib surface area is 1.94 meters squared. Body mass index is 25.1 kg/m.  HEENT: sclera anicteric Lungs: clear to auscultation bilaterally Heart: Regular rate and rhythm.  MR murmur Abdomen: soft  Extremities: edema noted to ankle   Assessment   89 y.o. female with  No diagnosis found.      Plan  She has some lower extremity edema and I encouraged her to take additional Lasix  if she sees persistence.  I recommend doing this for 3 days at a time.  He is on other medical therapy but I will not change anything today given her systolic predominance hypertension.  Limited in use of spironolactone given her chronic kidney disease and potassium.  Reviewed her pacemaker interrogation and she has 40% RA pacing and 70% RV pacing.  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  We could make an argument for CRT upgrade but given her advanced age as well as her current quality of life which she deems appropriate I am not sure that this would be ideal from a risk-benefit standpoint.  We can continue to monitor her for any worsening symptoms but if this does become an issue then we could consider biventricular pacing as a strategy.  Will see her again in the heart failure clinic in approximately 6 months.     Return in about 6 months (around 02/24/2024).  CHETAN B PATEL, MD

## 2023-08-28 DIAGNOSIS — N184 Chronic kidney disease, stage 4 (severe): Secondary | ICD-10-CM | POA: Diagnosis not present

## 2023-08-28 DIAGNOSIS — J9601 Acute respiratory failure with hypoxia: Secondary | ICD-10-CM | POA: Diagnosis not present

## 2023-08-28 DIAGNOSIS — I480 Paroxysmal atrial fibrillation: Secondary | ICD-10-CM | POA: Diagnosis not present

## 2023-08-28 DIAGNOSIS — I110 Hypertensive heart disease with heart failure: Secondary | ICD-10-CM | POA: Diagnosis not present

## 2023-08-28 DIAGNOSIS — N179 Acute kidney failure, unspecified: Secondary | ICD-10-CM | POA: Diagnosis not present

## 2023-08-28 DIAGNOSIS — I5033 Acute on chronic diastolic (congestive) heart failure: Secondary | ICD-10-CM | POA: Diagnosis not present

## 2023-08-28 DIAGNOSIS — D631 Anemia in chronic kidney disease: Secondary | ICD-10-CM | POA: Diagnosis not present

## 2023-08-28 DIAGNOSIS — J4489 Other specified chronic obstructive pulmonary disease: Secondary | ICD-10-CM | POA: Diagnosis not present

## 2023-08-28 DIAGNOSIS — E1122 Type 2 diabetes mellitus with diabetic chronic kidney disease: Secondary | ICD-10-CM | POA: Diagnosis not present

## 2023-08-29 ENCOUNTER — Telehealth: Payer: Self-pay | Admitting: *Deleted

## 2023-08-29 NOTE — Patient Outreach (Signed)
  Care Coordination   08/29/2023 Name: Leslie Parker MRN: [id number] DOB: 03/22/1933   Care Coordination Outreach Attempts:  An unsuccessful telephone outreach was attempted today to offer the patient information about available complex care management services.  Follow Up Plan:  Additional outreach attempts will be made to offer the patient complex care management information and services.   Encounter Outcome:  No Answer   Care Coordination Interventions:  No, not indicated    Odella Ku, RN, MSN, CCM [place]  [facility], [facility] RN Care Coordinator Direct Dial: [phone] / Main [phone] Fax [phone] Email: [email or web address] Website: [email or web address]

## 2023-08-30 ENCOUNTER — Telehealth: Payer: Self-pay | Admitting: *Deleted

## 2023-08-30 DIAGNOSIS — N184 Chronic kidney disease, stage 4 (severe): Secondary | ICD-10-CM | POA: Diagnosis not present

## 2023-08-30 DIAGNOSIS — I110 Hypertensive heart disease with heart failure: Secondary | ICD-10-CM | POA: Diagnosis not present

## 2023-08-30 DIAGNOSIS — I5033 Acute on chronic diastolic (congestive) heart failure: Secondary | ICD-10-CM | POA: Diagnosis not present

## 2023-08-30 DIAGNOSIS — J4489 Other specified chronic obstructive pulmonary disease: Secondary | ICD-10-CM | POA: Diagnosis not present

## 2023-08-30 DIAGNOSIS — D631 Anemia in chronic kidney disease: Secondary | ICD-10-CM | POA: Diagnosis not present

## 2023-08-30 DIAGNOSIS — J9601 Acute respiratory failure with hypoxia: Secondary | ICD-10-CM | POA: Diagnosis not present

## 2023-08-30 DIAGNOSIS — I480 Paroxysmal atrial fibrillation: Secondary | ICD-10-CM | POA: Diagnosis not present

## 2023-08-30 DIAGNOSIS — N179 Acute kidney failure, unspecified: Secondary | ICD-10-CM | POA: Diagnosis not present

## 2023-08-30 DIAGNOSIS — E1122 Type 2 diabetes mellitus with diabetic chronic kidney disease: Secondary | ICD-10-CM | POA: Diagnosis not present

## 2023-08-30 NOTE — Progress Notes (Signed)
 Complex Care Management Care Guide Note  08/30/2023 Name: Leslie Parker MRN: [id number] DOB: 10-10-1932  BRYNDA HEICK is a 89 y.o. year old female who is a primary care patient of Sherial Bail, MD and is actively engaged with the care management team. I reached out to Clancy CHRISTELLA Franks by phone today to assist with re-scheduling  with the RN Case Manager.  Follow up plan: Unsuccessful telephone outreach attempt made. A HIPAA compliant phone message was left for the patient providing contact information and requesting a return call.  Thedford Franks, CMA, Care Guide [facility]  [facility], [facility] Guide Direct Dial: [phone]  Fax: [phone] Website: [email or web address]

## 2023-09-02 NOTE — Progress Notes (Signed)
 Complex Care Management Care Guide Note  09/02/2023 Name: Leslie Parker MRN: [id number] DOB: [birth year]  Leslie Parker is a 89 y.o. year old female who is a primary care patient of Rex Castor, MD and is actively engaged with the care management team. I reached out to Leslie Parker by phone today to assist with re-scheduling  with the RN Case Manager.  Follow up plan: unsuccessful telephone outreach attempt made. A HIPAA compliant phone message was left for the patient providing contact information and requesting a return call. No additional outreach attempts will be made due to inability to maintain patient contact.   Kandis Ormond, CMA, Care Guide [facility]  [facility], [facility] Guide Direct Dial: [phone]  Fax: [phone] Website: [email or web address]

## 2023-09-04 DIAGNOSIS — I495 Sick sinus syndrome: Secondary | ICD-10-CM | POA: Diagnosis not present

## 2023-09-05 ENCOUNTER — Telehealth: Payer: Self-pay | Admitting: *Deleted

## 2023-09-05 DIAGNOSIS — I110 Hypertensive heart disease with heart failure: Secondary | ICD-10-CM | POA: Diagnosis not present

## 2023-09-05 DIAGNOSIS — N179 Acute kidney failure, unspecified: Secondary | ICD-10-CM | POA: Diagnosis not present

## 2023-09-05 DIAGNOSIS — N184 Chronic kidney disease, stage 4 (severe): Secondary | ICD-10-CM | POA: Diagnosis not present

## 2023-09-05 DIAGNOSIS — I5033 Acute on chronic diastolic (congestive) heart failure: Secondary | ICD-10-CM | POA: Diagnosis not present

## 2023-09-05 DIAGNOSIS — J4489 Other specified chronic obstructive pulmonary disease: Secondary | ICD-10-CM | POA: Diagnosis not present

## 2023-09-05 DIAGNOSIS — J9601 Acute respiratory failure with hypoxia: Secondary | ICD-10-CM | POA: Diagnosis not present

## 2023-09-05 DIAGNOSIS — E1122 Type 2 diabetes mellitus with diabetic chronic kidney disease: Secondary | ICD-10-CM | POA: Diagnosis not present

## 2023-09-05 DIAGNOSIS — I480 Paroxysmal atrial fibrillation: Secondary | ICD-10-CM | POA: Diagnosis not present

## 2023-09-05 DIAGNOSIS — D631 Anemia in chronic kidney disease: Secondary | ICD-10-CM | POA: Diagnosis not present

## 2023-09-05 NOTE — Patient Outreach (Signed)
  Care Coordination   09/05/2023 Name: Leslie Parker MRN: [id number] DOB: [birth year]   Care Coordination Outreach Attempts:  An unsuccessful telephone outreach was attempted today to offer the patient information about available complex care management services.  Call placed to granddaughter, Leslie Parker, confirms correct phone number for patient.  She will call patient directly and tell patient to call this RNCM.   Follow Up Plan:  No further outreach attempts will be made at this time. We have been unable to contact the patient to offer or enroll patient in complex care management services.  Will re-open case if patient calls back.   Encounter Outcome:  No Answer   Care Coordination Interventions:  No, not indicated    Rodney Langton, RN, MSN, CCM [place]  [facility], [facility] RN Care Coordinator Direct Dial: [phone] / Main [phone] Fax [phone] Email: [email or web address] Website: [email or web address]

## 2023-09-09 DIAGNOSIS — J9601 Acute respiratory failure with hypoxia: Secondary | ICD-10-CM | POA: Diagnosis not present

## 2023-09-09 DIAGNOSIS — E1122 Type 2 diabetes mellitus with diabetic chronic kidney disease: Secondary | ICD-10-CM | POA: Diagnosis not present

## 2023-09-09 DIAGNOSIS — J4489 Other specified chronic obstructive pulmonary disease: Secondary | ICD-10-CM | POA: Diagnosis not present

## 2023-09-09 DIAGNOSIS — N179 Acute kidney failure, unspecified: Secondary | ICD-10-CM | POA: Diagnosis not present

## 2023-09-09 DIAGNOSIS — D631 Anemia in chronic kidney disease: Secondary | ICD-10-CM | POA: Diagnosis not present

## 2023-09-09 DIAGNOSIS — I5033 Acute on chronic diastolic (congestive) heart failure: Secondary | ICD-10-CM | POA: Diagnosis not present

## 2023-09-09 DIAGNOSIS — I110 Hypertensive heart disease with heart failure: Secondary | ICD-10-CM | POA: Diagnosis not present

## 2023-09-09 DIAGNOSIS — I480 Paroxysmal atrial fibrillation: Secondary | ICD-10-CM | POA: Diagnosis not present

## 2023-09-09 DIAGNOSIS — N184 Chronic kidney disease, stage 4 (severe): Secondary | ICD-10-CM | POA: Diagnosis not present

## 2023-09-13 ENCOUNTER — Ambulatory Visit: Payer: Self-pay | Admitting: *Deleted

## 2023-09-13 NOTE — Patient Outreach (Signed)
 Care Coordination   Follow Up Visit Note   09/13/2023 Name: Leslie Parker MRN: [id number] DOB: [birth year]  Leslie Parker is a 89 y.o. year old female who sees Leslie Baas, MD for primary care. I spoke with  Hendricks Parker by phone today.  What matters to the patients health and wellness today?  Patient state she is feeling "ok" and looking forward to finding out what can be done for her back.  Report she was told she would need some type of adjustment, will find out more information in the next couple weeks.     Goals Addressed             This Visit's Progress    Management of chronic health conditions   On track    Interventions Today    Flowsheet Row Most Recent Value  Chronic Disease   Chronic disease during today's visit Other, Congestive Heart Failure (CHF), Chronic Obstructive Pulmonary Disease (COPD), Diabetes  [recovered from hip surgery, but now having back issues]  General Interventions   General Interventions Discussed/Reviewed General Interventions Reviewed, Doctor Visits, Durable Medical Equipment (DME)  Doctor Visits Discussed/Reviewed Doctor Visits Reviewed, Specialist  [Upcoming with ortho 3/13, PCP 4/1]  Durable Medical Equipment (DME) Dan Humphreys, Wheelchair  Wheelchair Standard  PCP/Specialist Visits Compliance with follow-up visit  Exercise Interventions   Exercise Discussed/Reviewed Physical Activity  Physical Activity Discussed/Reviewed Physical Activity Reviewed  [No PT currently, state this has stopped until back can be assessed]  Education Interventions   Education Provided Provided Education  Provided Verbal Education On Medication, When to see the doctor, Other  [Monitors oxygen levels, medications reviewed, taking as instructed, uses nebulizers as needed for shortness of breath]  Safety Interventions   Safety Discussed/Reviewed Fall Risk, Safety Reviewed  [Uses walker and wheelchair when out of the home to decrease risk of fall]               SDOH assessments and interventions completed:  No     Care Coordination Interventions:  Yes, provided   Follow up plan: Follow up call scheduled for 3/14    Encounter Outcome:  Patient Visit Completed   Rodney Langton, RN, MSN, CCM [place]  [facility], [facility] RN Care Coordinator Direct Dial: [phone] / Main [phone] Fax [phone] Email: [email or web address] Website: [email or web address]

## 2023-09-15 ENCOUNTER — Emergency Department: Payer: Medicare HMO

## 2023-09-15 ENCOUNTER — Inpatient Hospital Stay
Admission: EM | Admit: 2023-09-15 | Discharge: 2023-09-19 | DRG: 291 | Disposition: A | Discharge status: Home / Self Care | Payer: Medicare HMO | Attending: Internal Medicine | Admitting: Internal Medicine

## 2023-09-15 ENCOUNTER — Encounter: Payer: Self-pay | Admitting: Internal Medicine

## 2023-09-15 ENCOUNTER — Other Ambulatory Visit: Payer: Self-pay

## 2023-09-15 DIAGNOSIS — Z7901 Long term (current) use of anticoagulants: Secondary | ICD-10-CM | POA: Diagnosis not present

## 2023-09-15 DIAGNOSIS — Z825 Family history of asthma and other chronic lower respiratory diseases: Secondary | ICD-10-CM

## 2023-09-15 DIAGNOSIS — Z8249 Family history of ischemic heart disease and other diseases of the circulatory system: Secondary | ICD-10-CM | POA: Diagnosis not present

## 2023-09-15 DIAGNOSIS — Z96653 Presence of artificial knee joint, bilateral: Secondary | ICD-10-CM | POA: Diagnosis present

## 2023-09-15 DIAGNOSIS — Z950 Presence of cardiac pacemaker: Secondary | ICD-10-CM

## 2023-09-15 DIAGNOSIS — Z79899 Other long term (current) drug therapy: Secondary | ICD-10-CM | POA: Diagnosis not present

## 2023-09-15 DIAGNOSIS — E785 Hyperlipidemia, unspecified: Secondary | ICD-10-CM | POA: Diagnosis present

## 2023-09-15 DIAGNOSIS — I480 Paroxysmal atrial fibrillation: Secondary | ICD-10-CM | POA: Diagnosis not present

## 2023-09-15 DIAGNOSIS — I130 Hypertensive heart and chronic kidney disease with heart failure and stage 1 through stage 4 chronic kidney disease, or unspecified chronic kidney disease: Secondary | ICD-10-CM | POA: Diagnosis not present

## 2023-09-15 DIAGNOSIS — E871 Hypo-osmolality and hyponatremia: Secondary | ICD-10-CM | POA: Diagnosis present

## 2023-09-15 DIAGNOSIS — R069 Unspecified abnormalities of breathing: Secondary | ICD-10-CM | POA: Diagnosis not present

## 2023-09-15 DIAGNOSIS — Z7189 Other specified counseling: Secondary | ICD-10-CM | POA: Diagnosis not present

## 2023-09-15 DIAGNOSIS — N179 Acute kidney failure, unspecified: Secondary | ICD-10-CM | POA: Diagnosis present

## 2023-09-15 DIAGNOSIS — E1129 Type 2 diabetes mellitus with other diabetic kidney complication: Secondary | ICD-10-CM | POA: Diagnosis present

## 2023-09-15 DIAGNOSIS — Z7951 Long term (current) use of inhaled steroids: Secondary | ICD-10-CM | POA: Diagnosis not present

## 2023-09-15 DIAGNOSIS — I10 Essential (primary) hypertension: Secondary | ICD-10-CM | POA: Diagnosis present

## 2023-09-15 DIAGNOSIS — Z888 Allergy status to other drugs, medicaments and biological substances status: Secondary | ICD-10-CM | POA: Diagnosis not present

## 2023-09-15 DIAGNOSIS — Z7984 Long term (current) use of oral hypoglycemic drugs: Secondary | ICD-10-CM

## 2023-09-15 DIAGNOSIS — E1122 Type 2 diabetes mellitus with diabetic chronic kidney disease: Secondary | ICD-10-CM | POA: Diagnosis present

## 2023-09-15 DIAGNOSIS — I429 Cardiomyopathy, unspecified: Secondary | ICD-10-CM | POA: Diagnosis present

## 2023-09-15 DIAGNOSIS — J4489 Other specified chronic obstructive pulmonary disease: Secondary | ICD-10-CM | POA: Diagnosis present

## 2023-09-15 DIAGNOSIS — R918 Other nonspecific abnormal finding of lung field: Secondary | ICD-10-CM | POA: Diagnosis not present

## 2023-09-15 DIAGNOSIS — Z90710 Acquired absence of both cervix and uterus: Secondary | ICD-10-CM

## 2023-09-15 DIAGNOSIS — I110 Hypertensive heart disease with heart failure: Secondary | ICD-10-CM | POA: Diagnosis not present

## 2023-09-15 DIAGNOSIS — Z6825 Body mass index (BMI) 25.0-25.9, adult: Secondary | ICD-10-CM

## 2023-09-15 DIAGNOSIS — I5043 Acute on chronic combined systolic (congestive) and diastolic (congestive) heart failure: Secondary | ICD-10-CM | POA: Diagnosis present

## 2023-09-15 DIAGNOSIS — J449 Chronic obstructive pulmonary disease, unspecified: Secondary | ICD-10-CM | POA: Diagnosis present

## 2023-09-15 DIAGNOSIS — I495 Sick sinus syndrome: Secondary | ICD-10-CM | POA: Diagnosis present

## 2023-09-15 DIAGNOSIS — J811 Chronic pulmonary edema: Secondary | ICD-10-CM | POA: Diagnosis not present

## 2023-09-15 DIAGNOSIS — I5041 Acute combined systolic (congestive) and diastolic (congestive) heart failure: Secondary | ICD-10-CM | POA: Diagnosis present

## 2023-09-15 DIAGNOSIS — I509 Heart failure, unspecified: Secondary | ICD-10-CM | POA: Diagnosis not present

## 2023-09-15 DIAGNOSIS — I5033 Acute on chronic diastolic (congestive) heart failure: Secondary | ICD-10-CM | POA: Diagnosis present

## 2023-09-15 DIAGNOSIS — J9601 Acute respiratory failure with hypoxia: Principal | ICD-10-CM | POA: Diagnosis present

## 2023-09-15 DIAGNOSIS — E440 Moderate protein-calorie malnutrition: Secondary | ICD-10-CM | POA: Insufficient documentation

## 2023-09-15 DIAGNOSIS — I4891 Unspecified atrial fibrillation: Secondary | ICD-10-CM | POA: Diagnosis not present

## 2023-09-15 DIAGNOSIS — Z96611 Presence of right artificial shoulder joint: Secondary | ICD-10-CM | POA: Diagnosis not present

## 2023-09-15 DIAGNOSIS — N184 Chronic kidney disease, stage 4 (severe): Secondary | ICD-10-CM | POA: Diagnosis not present

## 2023-09-15 DIAGNOSIS — E876 Hypokalemia: Secondary | ICD-10-CM | POA: Diagnosis present

## 2023-09-15 DIAGNOSIS — R062 Wheezing: Secondary | ICD-10-CM | POA: Diagnosis not present

## 2023-09-15 LAB — CBC WITH DIFFERENTIAL/PLATELET
Abs Immature Granulocytes: 0.04 10*3/uL (ref 0.00–0.07)
Basophils Absolute: 0 10*3/uL (ref 0.0–0.1)
Basophils Relative: 0 %
Eosinophils Absolute: 0.2 10*3/uL (ref 0.0–0.5)
Eosinophils Relative: 3 %
HCT: 36.3 % (ref 36.0–46.0)
Hemoglobin: 12.4 g/dL (ref 12.0–15.0)
Immature Granulocytes: 1 %
Lymphocytes Relative: 20 %
Lymphs Abs: 1.2 10*3/uL (ref 0.7–4.0)
MCH: 30.6 pg (ref 26.0–34.0)
MCHC: 34.2 g/dL (ref 30.0–36.0)
MCV: 89.6 fL (ref 80.0–100.0)
Monocytes Absolute: 0.3 10*3/uL (ref 0.1–1.0)
Monocytes Relative: 6 %
Neutro Abs: 4.1 10*3/uL (ref 1.7–7.7)
Neutrophils Relative %: 70 %
Platelets: 294 10*3/uL (ref 150–400)
RBC: 4.05 MIL/uL (ref 3.87–5.11)
RDW: 16.6 % — ABNORMAL HIGH (ref 11.5–15.5)
WBC: 5.8 10*3/uL (ref 4.0–10.5)
nRBC: 0 % (ref 0.0–0.2)

## 2023-09-15 LAB — TROPONIN I (HIGH SENSITIVITY)
Troponin I (High Sensitivity): 84 ng/L — ABNORMAL HIGH (ref <18)
Troponin I (High Sensitivity): 97 ng/L — ABNORMAL HIGH (ref <18)

## 2023-09-15 LAB — BASIC METABOLIC PANEL
Anion gap: 14 (ref 5–15)
BUN: 47 mg/dL — ABNORMAL HIGH (ref 8–23)
CO2: 20 mmol/L — ABNORMAL LOW (ref 22–32)
Calcium: 8.5 mg/dL — ABNORMAL LOW (ref 8.9–10.3)
Chloride: 94 mmol/L — ABNORMAL LOW (ref 98–111)
Creatinine, Ser: 2.43 mg/dL — ABNORMAL HIGH (ref 0.44–1.00)
GFR, Estimated: 19 mL/min — ABNORMAL LOW (ref >60)
Glucose, Bld: 180 mg/dL — ABNORMAL HIGH (ref 70–99)
Potassium: 4 mmol/L (ref 3.5–5.1)
Sodium: 128 mmol/L — ABNORMAL LOW (ref 135–145)

## 2023-09-15 LAB — URINALYSIS, ROUTINE W REFLEX MICROSCOPIC
Bacteria, UA: NONE SEEN
Bilirubin Urine: NEGATIVE
Glucose, UA: >=500 mg/dL — AB
Hgb urine dipstick: NEGATIVE
Ketones, ur: NEGATIVE mg/dL
Nitrite: NEGATIVE
Protein, ur: 100 mg/dL — AB
Specific Gravity, Urine: 1.011 (ref 1.005–1.030)
pH: 6 (ref 5.0–8.0)

## 2023-09-15 LAB — BLOOD GAS, VENOUS
Acid-base deficit: 1.1 mmol/L (ref 0.0–2.0)
Bicarbonate: 23.5 mmol/L (ref 20.0–28.0)
O2 Saturation: 92.5 %
Patient temperature: 37
pCO2, Ven: 38 mmHg — ABNORMAL LOW (ref 44–60)
pH, Ven: 7.4 (ref 7.25–7.43)
pO2, Ven: 58 mmHg — ABNORMAL HIGH (ref 32–45)

## 2023-09-15 LAB — BRAIN NATRIURETIC PEPTIDE: B Natriuretic Peptide: >4500 pg/mL — ABNORMAL HIGH (ref 0.0–100.0)

## 2023-09-15 MED ORDER — EMPAGLIFLOZIN 10 MG PO TABS
10.0000 mg | ORAL_TABLET | Freq: Every day | ORAL | Status: DC
  Administered 2023-09-15 – 2023-09-17 (×3): 10 mg via ORAL
  Filled 2023-09-15 (×4): qty 1

## 2023-09-15 MED ORDER — FUROSEMIDE 10 MG/ML IJ SOLN
60.0000 mg | Freq: Once | INTRAMUSCULAR | Status: AC
  Administered 2023-09-15: 60 mg via INTRAVENOUS
  Filled 2023-09-15: qty 8

## 2023-09-15 MED ORDER — PANTOPRAZOLE SODIUM 40 MG PO TBEC
40.0000 mg | DELAYED_RELEASE_TABLET | Freq: Every day | ORAL | Status: DC
  Administered 2023-09-15 – 2023-09-19 (×5): 40 mg via ORAL
  Filled 2023-09-15 (×5): qty 1

## 2023-09-15 MED ORDER — HYDRALAZINE HCL 50 MG PO TABS
50.0000 mg | ORAL_TABLET | Freq: Three times a day (TID) | ORAL | Status: DC
  Administered 2023-09-15 – 2023-09-19 (×13): 50 mg via ORAL
  Filled 2023-09-15 (×13): qty 1

## 2023-09-15 MED ORDER — SODIUM CHLORIDE 0.9 % IV SOLN: 250.0000 mL | INTRAVENOUS | Status: DC | PRN

## 2023-09-15 MED ORDER — ONDANSETRON HCL 4 MG/2ML IJ SOLN
4.0000 mg | Freq: Four times a day (QID) | INTRAMUSCULAR | Status: DC | PRN
Start: 2023-09-15 — End: 2023-09-19

## 2023-09-15 MED ORDER — AMIODARONE HCL 200 MG PO TABS
200.0000 mg | ORAL_TABLET | Freq: Every day | ORAL | Status: DC
  Administered 2023-09-15 – 2023-09-19 (×5): 200 mg via ORAL
  Filled 2023-09-15 (×5): qty 1

## 2023-09-15 MED ORDER — FUROSEMIDE 10 MG/ML IJ SOLN
40.0000 mg | Freq: Two times a day (BID) | INTRAMUSCULAR | Status: DC
  Administered 2023-09-15: 40 mg via INTRAVENOUS
  Filled 2023-09-15: qty 4

## 2023-09-15 MED ORDER — COLESTIPOL HCL 1 G PO TABS
1.0000 g | ORAL_TABLET | Freq: Two times a day (BID) | ORAL | Status: DC
  Administered 2023-09-15 – 2023-09-19 (×9): 1 g via ORAL
  Filled 2023-09-15 (×9): qty 1

## 2023-09-15 MED ORDER — FLUTICASONE FUROATE-VILANTEROL 100-25 MCG/ACT IN AEPB
1.0000 | INHALATION_SPRAY | Freq: Every day | RESPIRATORY_TRACT | Status: DC
  Administered 2023-09-15 – 2023-09-19 (×5): 1 via RESPIRATORY_TRACT
  Filled 2023-09-15: qty 28

## 2023-09-15 MED ORDER — CARVEDILOL 6.25 MG PO TABS
6.2500 mg | ORAL_TABLET | Freq: Two times a day (BID) | ORAL | Status: DC
  Administered 2023-09-15 – 2023-09-19 (×9): 6.25 mg via ORAL
  Filled 2023-09-15 (×9): qty 1

## 2023-09-15 MED ORDER — ONDANSETRON 4 MG PO TBDP: 4.0000 mg | ORAL_TABLET | Freq: Three times a day (TID) | ORAL | Status: DC | PRN

## 2023-09-15 MED ORDER — ACETAMINOPHEN 325 MG PO TABS
650.0000 mg | ORAL_TABLET | ORAL | Status: DC | PRN
  Administered 2023-09-18: 650 mg via ORAL
  Filled 2023-09-15: qty 2

## 2023-09-15 MED ORDER — TRAZODONE HCL 100 MG PO TABS
100.0000 mg | ORAL_TABLET | Freq: Every day | ORAL | Status: DC
  Administered 2023-09-15 – 2023-09-18 (×4): 100 mg via ORAL
  Filled 2023-09-15 (×4): qty 1

## 2023-09-15 MED ORDER — ORAL CARE MOUTH RINSE: 15.0000 mL | OROMUCOSAL | Status: DC | PRN

## 2023-09-15 MED ORDER — IPRATROPIUM-ALBUTEROL 0.5-2.5 (3) MG/3ML IN SOLN
3.0000 mL | Freq: Once | RESPIRATORY_TRACT | Status: AC
  Administered 2023-09-15: 3 mL via RESPIRATORY_TRACT
  Filled 2023-09-15: qty 3

## 2023-09-15 MED ORDER — ISOSORBIDE MONONITRATE ER 30 MG PO TB24
30.0000 mg | ORAL_TABLET | Freq: Every day | ORAL | Status: DC
  Administered 2023-09-15 – 2023-09-18 (×4): 30 mg via ORAL
  Filled 2023-09-15 (×4): qty 1

## 2023-09-15 MED ORDER — APIXABAN 2.5 MG PO TABS
2.5000 mg | ORAL_TABLET | Freq: Two times a day (BID) | ORAL | Status: DC
  Administered 2023-09-15 – 2023-09-19 (×9): 2.5 mg via ORAL
  Filled 2023-09-15 (×9): qty 1

## 2023-09-15 MED ORDER — ENSURE ENLIVE PO LIQD: 237.0000 mL | Freq: Two times a day (BID) | ORAL | Status: DC

## 2023-09-15 MED ORDER — SODIUM CHLORIDE 0.9% FLUSH
3.0000 mL | Freq: Two times a day (BID) | INTRAVENOUS | Status: DC
  Administered 2023-09-15 – 2023-09-16 (×3): 3 mL via INTRAVENOUS

## 2023-09-15 MED ORDER — ALBUTEROL SULFATE (2.5 MG/3ML) 0.083% IN NEBU: 2.5000 mg | INHALATION_SOLUTION | Freq: Four times a day (QID) | RESPIRATORY_TRACT | Status: DC | PRN

## 2023-09-15 MED ORDER — METHYLPREDNISOLONE SODIUM SUCC 125 MG IJ SOLR
125.0000 mg | Freq: Once | INTRAMUSCULAR | Status: AC
  Administered 2023-09-15: 125 mg via INTRAVENOUS
  Filled 2023-09-15: qty 2

## 2023-09-15 MED ORDER — TRAMADOL HCL 50 MG PO TABS: 50.0000 mg | ORAL_TABLET | Freq: Four times a day (QID) | ORAL | Status: DC | PRN

## 2023-09-15 MED ORDER — SODIUM CHLORIDE 0.9% FLUSH: 3.0000 mL | INTRAVENOUS | Status: DC | PRN

## 2023-09-15 MED ORDER — UMECLIDINIUM BROMIDE 62.5 MCG/ACT IN AEPB
1.0000 | INHALATION_SPRAY | Freq: Every day | RESPIRATORY_TRACT | Status: DC
  Administered 2023-09-15 – 2023-09-19 (×5): 1 via RESPIRATORY_TRACT
  Filled 2023-09-15: qty 7

## 2023-09-15 NOTE — H&P (Addendum)
 History and Physical    Leslie Parker ZOX:[id number] DOB: 12-25-1932 DOA: 09/15/2023  PCP: [name], MD (Confirm with patient/family/NH records and if not entered, this has to be entered at [facility] point of entry) Patient coming from: Home  I have personally briefly reviewed patient's old medical records in [facility]  Chief Complaint: Shortness of breath generalized weakness  HPI: Leslie Parker is a 89 y.o. female with medical history significant of chronic HFrEF with LVEF 25-30%, SSS status post PPM, PAF on Eliquis, CKD stage IV, HTN, IIDM, presented with increasing shortness of breath and generalized weakness.  Patient reported that she had been feeling low energy and generalized weakness for the last 2 days, but was not particularly shortness of breath and no cough.  This morning, patient woke up around 4 AM and feeling shortness of breath, denied any chest pain no fever, no cough.  Patient also reported that she has been having poor appetite.  Last 3 to 4 weeks and estimated lost about 12 to 15 pounds at home.  She reported that she checks her blood pressure at home every day and BP has been controlled with SBP 130-140s.  ED Course: Afebrile, blood pressure elevated at 160/80, O2 saturation 93% on room air.  Chest x-ray showed cardiomegaly and pulmonary congestion, blood work showed hemoglobin 12.4 WBC 5.8, creatinine 2.4 compared to baseline 2.2-2.7, BUN 47, K4.0.  Patient was given IV Lasix 40 mg x 1.  Review of Systems: As per HPI otherwise 14 point review of systems negative.   Past Medical History:  Diagnosis Date   Acute kidney injury superimposed on CKD (HCC) 11/24/2022   Asthma    Atrial fibrillation with RVR (HCC)    Cancer (HCC)    Basal Cell   Closed left hip fracture (HCC) 01/25/2017   Closed right hip fracture (HCC) 02/13/2023   Diabetes mellitus without complication (HCC)    Femur fracture, left (HCC) 02/03/2015   Heart murmur    Hypertension     Impetigo    Osteoarthritis    Osteopenia    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   Rapid atrial fibrillation, new onset(HCC) 06/05/2022   Vomiting    can not due to surgery   Wears dentures    full upper and lower    Past Surgical History:  Procedure Laterality Date   ABDOMINAL HYSTERECTOMY     BACK SURGERY     BLADDER SURGERY     mesh   CARPAL TUNNEL RELEASE Bilateral    CATARACT EXTRACTION Right 2017   CATARACT EXTRACTION W/PHACO Left 02/06/2016   Procedure: CATARACT EXTRACTION PHACO AND INTRAOCULAR LENS PLACEMENT (IOC) left eye;  Surgeon: Sherald Hess, MD;  Location: [facility];  Service: Ophthalmology;  Laterality: Left;  DIABETIC LEFT Cannot arrive before 9:30   CERVICAL DISC SURGERY     CHOLECYSTECTOMY     EYE SURGERY Bilateral    Cataract Extraction with IOL   FEMUR IM NAIL Left 02/04/2015   Procedure: INTRAMEDULLARY (IM) NAIL FEMORAL;  Surgeon: Juanell Fairly, MD;  Location: [facility];  Service: Orthopedics;  Laterality: Left;   HARDWARE REMOVAL Left 01/17/2017   Procedure: HARDWARE REMOVAL;  Surgeon: Juanell Fairly, MD;  Location: [facility];  Service: Orthopedics;  Laterality: Left;   HERNIA REPAIR  2014   esophageal and gastric mesh. patient unable to throw up d/t mesh   HIP ARTHROPLASTY Left 01/26/2017   Procedure: ARTHROPLASTY BIPOLAR HIP (HEMIARTHROPLASTY) removal hardware left hip;  Surgeon: Deeann Saint,  MD;  Location: [facility];  Service: Orthopedics;  Laterality: Left;   INTRAMEDULLARY (IM) NAIL INTERTROCHANTERIC Right 02/13/2023   Procedure: INTRAMEDULLARY (IM) NAIL INTERTROCHANTERIC;  Surgeon: Christena Flake, MD;  Location: [facility];  Service: Orthopedics;  Laterality: Right;   JOINT REPLACEMENT Bilateral    knees   PACEMAKER IMPLANT N/A 11/15/2022   Procedure: PACEMAKER IMPLANT;  Surgeon: Marcina Millard, MD;  Location: [facility];  Service: Cardiovascular;  Laterality: N/A;   PACEMAKER IMPLANT N/A 11/28/2022   Procedure:  PACEMAKER IMPLANT;  Surgeon: Marcina Millard, MD;  Location: [facility];  Service: Cardiovascular;  Laterality: N/A;  Lead reposition   REPLACEMENT TOTAL KNEE BILATERAL Bilateral [id number]   SHOULDER ARTHROSCOPY WITH OPEN ROTATOR CUFF REPAIR AND DISTAL CLAVICLE ACROMINECTOMY Left 10/25/2016   Procedure: SHOULDER ARTHROSCOPY WITH OPEN ROTATOR CUFF REPAIR AND DISTAL CLAVICLE ACROMINECTOMY;  Surgeon: Juanell Fairly, MD;  Location: [facility];  Service: Orthopedics;  Laterality: Left;   THYROID SURGERY     goiter removed   TOTAL HIP REVISION Left 12/02/2017   Procedure: TOTAL HIP REVISION;  Surgeon: Lyndle Herrlich, MD;  Location: [facility];  Service: Orthopedics;  Laterality: Left;   TOTAL SHOULDER REPLACEMENT Right 2012     reports that she has never smoked. She has never used smokeless tobacco. She reports that she does not drink alcohol and does not use drugs.  Allergies  Allergen Reactions   Baclofen Other (See Comments)    Elevated Cr   Simvastatin Other (See Comments)    Pt denies  Elevated LFTs with simva 40mg , stopped and resolved (see MD note 11/10/13)    Family History  Problem Relation Age of Onset   Heart failure Mother    Hypertension Mother    Emphysema Father      Prior to Admission medications   Medication Sig Start Date End Date Taking? Authorizing Provider  albuterol (VENTOLIN HFA) 108 (90 Base) MCG/ACT inhaler Inhale 2 puffs into the lungs every 6 (six) hours as needed. 08/04/19   [provider]  amiodarone (PACERONE) 200 MG tablet Take 200 mg by mouth daily.    [provider]  apixaban (ELIQUIS) 2.5 MG TABS tablet Take 1 tablet (2.5 mg total) by mouth 2 (two) times daily. 06/07/22   Tresa Moore, MD  ascorbic acid (VITAMIN C) 250 MG CHEW Chew 250 mg by mouth daily.    [provider]  carvedilol (COREG) 6.25 MG tablet Take 1 tablet (6.25 mg total) by mouth 2 (two) times daily with a meal. 08/11/23 11/09/23  Carollee Herter, DO   Cholecalciferol (VITAMIN D-1000 MAX ST) 25 MCG (1000 UT) tablet Take 1,000 Units by mouth daily.    [provider]  colestipol (COLESTID) 1 g tablet Take 1 tablet by mouth 2 (two) times daily. 06/01/22   [provider]  Cyanocobalamin (B-12) 2500 MCG TABS Take 2,500 mcg by mouth daily.    [provider]  empagliflozin (JARDIANCE) 10 MG TABS tablet Take 1 tablet (10 mg total) by mouth daily. 08/12/23 11/10/23  Carollee Herter, DO  furosemide (LASIX) 40 MG tablet Take 1 tablet (40 mg total) by mouth in the morning. 08/11/23 11/09/23  Carollee Herter, DO  hydrALAZINE (APRESOLINE) 50 MG tablet Take 1 tablet (50 mg total) by mouth 3 (three) times daily. 08/11/23 11/09/23  Carollee Herter, DO  ipratropium-albuterol (DUONEB) 0.5-2.5 (3) MG/3ML SOLN Inhale 3 mLs into the lungs every 6 (six) hours as needed. 06/01/22   [provider]  isosorbide mononitrate (IMDUR) 30 MG 24 hr tablet Take 1 tablet (30 mg total) by mouth at bedtime. 08/11/23 11/09/23  Carollee Herter, DO  Magnesium Oxide 500 MG CAPS Take 500 mg by mouth daily.    [provider]  Multiple Vitamin (MULTIVITAMIN WITH MINERALS) TABS tablet Take 1 tablet by mouth daily. One-A-Day Women's Vitamin    [provider]  omeprazole (PRILOSEC) 20 MG capsule Take 20 mg by mouth every morning. 01/10/23   [provider]  ondansetron (ZOFRAN-ODT) 4 MG disintegrating tablet Take 4 mg by mouth every 8 (eight) hours as needed. 05/21/23   [provider]  traMADol (ULTRAM) 50 MG tablet Take 1 tablet by mouth every 6 (six) hours as needed. 07/01/23   [provider]  traZODone (DESYREL) 100 MG tablet Take 100 mg by mouth at bedtime.    [provider]  TRELEGY ELLIPTA 100-62.5-25 MCG/ACT AEPB Inhale 1 puff into the lungs daily. 08/03/22   [provider]    Physical Exam: Vitals:   09/15/23 0642 09/15/23 0656 09/15/23 0700 09/15/23 0730  BP:  (!) 147/89 (!) 154/93 (!) 158/86  Pulse:   86 86 75  Resp:  (!) 22 (!) 23 19  Temp:      TempSrc:      SpO2:      Weight: 74.8 kg     Height: 5\' 9"  (1.753 m)       Constitutional: NAD, calm, comfortable Vitals:   09/15/23 0642 09/15/23 0656 09/15/23 0700 09/15/23 0730  BP:  (!) 147/89 (!) 154/93 (!) 158/86  Pulse:  86 86 75  Resp:  (!) 22 (!) 23 19  Temp:      TempSrc:      SpO2:      Weight: 74.8 kg     Height: 5\' 9"  (1.753 m)      Eyes: PERRL, lids and conjunctivae normal ENMT: Mucous membranes are moist. Posterior pharynx clear of any exudate or lesions.Normal dentition.  Neck: normal, supple, no masses, no thyromegaly Respiratory: clear to auscultation bilaterally, no wheezing, bilateral fine crackles to the mid levels, increasing respiratory effort. No accessory muscle use.  Cardiovascular: Regular rate and rhythm, no murmurs / rubs / gallops.  2+ extremity edema. 2+ pedal pulses. No carotid bruits.  Abdomen: no tenderness, no masses palpated. No hepatosplenomegaly. Bowel sounds positive.  Musculoskeletal: no clubbing / cyanosis. No joint deformity upper and lower extremities. Good ROM, no contractures. Normal muscle tone.  Skin: no rashes, lesions, ulcers. No induration Neurologic: CN 2-12 grossly intact. Sensation intact, DTR normal. Strength 5/5 in all 4.  Psychiatric: Normal judgment and insight. Alert and oriented x 3. Normal mood.     Labs on Admission: I have personally reviewed following labs and imaging studies  CBC: Recent Labs  Lab 09/15/23 0646  WBC 5.8  NEUTROABS 4.1  HGB 12.4  HCT 36.3  MCV 89.6  PLT 294   Basic Metabolic Panel: Recent Labs  Lab 09/15/23 0646  NA 128*  K 4.0  CL 94*  CO2 20*  GLUCOSE 180*  BUN 47*  CREATININE 2.43*  CALCIUM 8.5*   GFR: Estimated Creatinine Clearance: 16.7 mL/min (A) (by C-G formula based on SCr of 2.43 mg/dL (H)). Liver Function Tests: No results for input(s): "AST", "ALT", "ALKPHOS", "BILITOT", "PROT", "ALBUMIN" in the last 168 hours. No  results for input(s): "LIPASE", "AMYLASE" in the last 168 hours. No results for input(s): "AMMONIA" in the last 168 hours. Coagulation Profile: No results for input(s): "  INR", "PROTIME" in the last 168 hours. Cardiac Enzymes: No results for input(s): "CKTOTAL", "CKMB", "CKMBINDEX", "TROPONINI" in the last 168 hours. BNP (last 3 results) No results for input(s): "PROBNP" in the last 8760 hours. HbA1C: No results for input(s): "HGBA1C" in the last 72 hours. CBG: No results for input(s): "GLUCAP" in the last 168 hours. Lipid Profile: No results for input(s): "CHOL", "HDL", "LDLCALC", "TRIG", "CHOLHDL", "LDLDIRECT" in the last 72 hours. Thyroid Function Tests: No results for input(s): "TSH", "T4TOTAL", "FREET4", "T3FREE", "THYROIDAB" in the last 72 hours. Anemia Panel: No results for input(s): "VITAMINB12", "FOLATE", "FERRITIN", "TIBC", "IRON", "RETICCTPCT" in the last 72 hours. Urine analysis:    Component Value Date/Time   COLORURINE YELLOW (A) 08/19/2023 2256   APPEARANCEUR CLEAR (A) 08/19/2023 2256   APPEARANCEUR Clear 02/19/2013 0254   LABSPEC 1.005 08/19/2023 2256   LABSPEC 1.006 02/19/2013 0254   PHURINE 6.0 08/19/2023 2256   GLUCOSEU >=500 (A) 08/19/2023 2256   GLUCOSEU Negative 02/19/2013 0254   HGBUR SMALL (A) 08/19/2023 2256   BILIRUBINUR NEGATIVE 08/19/2023 2256   BILIRUBINUR Negative 02/19/2013 0254   KETONESUR NEGATIVE 08/19/2023 2256   PROTEINUR 30 (A) 08/19/2023 2256   NITRITE NEGATIVE 08/19/2023 2256   LEUKOCYTESUR SMALL (A) 08/19/2023 2256   LEUKOCYTESUR 1+ 02/19/2013 0254    Radiological Exams on Admission: DG Chest Portable 1 View Result Date: 09/15/2023 CLINICAL DATA:  Shortness of breath and weakness. EXAM: PORTABLE CHEST 1 VIEW COMPARISON:  Portable chest 08/10/2023. FINDINGS: Left chest dual lead pacing system with stable wire insertions. Stable cardiomegaly. There is central vascular prominence, interstitial edema in the lower lung fields and  small-to-moderate layering pleural effusions. Findings are compatible with CHF or fluid overload and are similar to the prior study. There are opacities in both lower lung fields which could indicate atelectasis, airspace edema or pneumonia. The mid and upper lung fields remain clear with COPD change. Stable mediastinum.  The aorta is tortuous and calcified. Right shoulder reverse arthroplasty with no new osseous findings. IMPRESSION: 1. Findings compatible with CHF or fluid overload, similar to the prior study. 2. Opacities in both lower lung fields which could indicate atelectasis, airspace edema or pneumonia. 3. Progress chest films recommended depending on clinical response. 4. COPD. 5. Aortic atherosclerosis. Electronically Signed   By: Almira Bar M.D.   On: 09/15/2023 07:39    EKG: Independently reviewed.  Sinus rhythm, chronic LBBB  Assessment/Plan Active Problems:   Acute respiratory failure with hypoxia (HCC)   Acute on chronic diastolic CHF (congestive heart failure) (HCC)   Acute combined systolic (congestive) and diastolic (congestive) heart failure (HCC) - LVEF 25%   CHF (congestive heart failure) (HCC)  (please populate well all problems here in Problem List. (For example, if patient is on BP meds at home and you resume or decide to hold them, it is a problem that needs to be her. Same for CAD, COPD, HLD and so on)  Acute on chronic HFrEF decompensation -Significant fluid overload, plan to continue aggressive IV diuresis 40 mg Lasix twice daily -Echocardiogram was done recently, will not repeat -Strict I&O's -Given the patient has had multiple hospitalization this season for similar CHF decompensation, long-term prognosis poor.  Will consult palliative care.  Confirmed with patient that she wishes full code at this point.  Cardiomyopathy -With low EF and chronic LBBB outpatient cardiology follow-up for CRT evaluation  HTN, uncontrolled -Resume home BP meds including Coreg,  hydralazine and Imdur  PAF -In sinus rhythm, continue amiodarone -Continue Eliquis  CKD stage IV -Volume overloaded, creatinine level stable -IV diuresis as above  COPD -Stable, no signs of acute exacerbation at this point  IIDM -Fairly controlled with most recent A1c 7.0 -Continue Jardiance  Unintentional weight loss Anorexia -Consult dietitian -Start protein supplement  Deconditioning -PT evaluation  DVT prophylaxis: Eliquis Code Status: Full code Family Communication: None at bedside Disposition Plan: Expect more than 2 midnight hospital stay, as patient is sick with decompensated CHF requiring IV diuresis, situation also complicated with CKD for which we have to monitor kidney function closely inpatient. Consults called: Palliative care Admission status: Telemetry admission   Emeline General MD [facility] Pager [phone]  09/15/2023, 8:31 AM

## 2023-09-15 NOTE — ED Provider Triage Note (Signed)
 Emergency Medicine Provider Triage Evaluation Note  Leslie Parker , a 89 y.o. female  was evaluated in triage.  Pt complains of shortness of breath.  Review of Systems  Positive: Shortness of breath Negative: Chest pain, fevers, cough  Physical Exam  BP (!) 161/140 (BP Location: Right Arm)   Pulse 96   Temp 97.6 F (36.4 C) (Axillary)   Resp 20   SpO2 93%  Gen:   Awake, elderly Resp:  Mildly tachypneic, no rhonchi or significant wheezing MSK:   Moves extremities without difficulty  Other:  Trace edema in bilateral distal lower extremities, no calf tenderness or calf swelling  Medical Decision Making  Medically screening exam initiated at 6:42 AM.  Appropriate orders placed.  Leslie Parker was informed that the remainder of the evaluation will be completed by another provider, this initial triage assessment does not replace that evaluation, and the importance of remaining in the ED until their evaluation is complete.  Patient here with HTN, CKD, CHF, COPD, SSS status post pacemaker here with shortness of breath.  Sats 75% on room air at rest at home per EMS.  Does not wear oxygen chronically.  Patient given DuoNeb in route and symptoms have improved.  Will obtain labs, EKG, chest x-ray, COVID and flu swab.  Will give another breathing treatment.  Patient currently satting 93% on room air.   Odyn Turko, Layla Maw, [place] 09/15/23 [phone]

## 2023-09-15 NOTE — Evaluation (Signed)
 Physical Therapy Evaluation Patient Details Name: Leslie Parker MRN: [id number] DOB: [birth year] Today's Date: 09/15/2023  History of Present Illness  Pt is an 89 y.o. female with medical history significant of COPD, chronic HFrEF with LVEF 25-30%, SSS status post PPM, PAF on Eliquis, CKD stage IV, HTN, IIDM, and per patient multiple L hip surgeries who presented with increasing shortness of breath and generalized weakness. MD assessment includes: acute respiratory failure with hypoxia, acute on chronic CHF, cardiomyopathy, deconditioning, and unintentional weight loss.   Clinical Impression  Pt was pleasant and motivated to participate during the session and put forth good effort throughout. Pt required min A with bed mobility tasks for LE and trunk management but was able to sit and stand with very good control and stability.  Pt self-limited her amb to around 5 feet due to fatigue but was steady taking steps with the RW with no overt LOB.  Pt reported feeling primarily general fatigue from very poor sleep the prior night and stated that she felt that she was close to her physical baseline and would be safe to return home at discharge with her granddaughter available for 24/7 supervision as needed.  Pt will benefit from continued PT services upon discharge to safely address deficits listed in patient problem list for decreased caregiver assistance and eventual return to PLOF.          If plan is discharge home, recommend the following: A little help with walking and/or transfers;A little help with bathing/dressing/bathroom;Assistance with cooking/housework;Assist for transportation   Can travel by private vehicle        Equipment Recommendations None recommended by PT  Recommendations for Other Services       Functional Status Assessment Patient has had a recent decline in their functional status and demonstrates the ability to make significant improvements in function in a reasonable and  predictable amount of time.     Precautions / Restrictions Precautions Precautions: Fall Restrictions Weight Bearing Restrictions Per Provider Order: No      Mobility  Bed Mobility Overal bed mobility: Needs Assistance Bed Mobility: Rolling, Sidelying to Sit, Sit to Sidelying Rolling: Supervision Sidelying to sit: Min assist     Sit to sidelying: Min assist, Used rails General bed mobility comments: Min A for BLE and trunk control with cues for log roll sequencing    Transfers Overall transfer level: Needs assistance Equipment used: Rolling walker (2 wheels) Transfers: Sit to/from Stand Sit to Stand: Contact guard assist           General transfer comment: Pt able to stand and sit with good eccentric and concentric control and stability    Ambulation/Gait Ambulation/Gait assistance: Contact guard assist Gait Distance (Feet): 5 Feet Assistive device: Rolling walker (2 wheels) Gait Pattern/deviations: Step-through pattern, Decreased step length - right, Decreased step length - left, Trunk flexed Gait velocity: decreased     General Gait Details: Pt able to take several steps with good stability and sequencing with the RW but declined additional ambulation secondary to general fatigue that per pt was due to lack of sleep  Stairs            Wheelchair Mobility     Tilt Bed    Modified Rankin (Stroke Patients Only)       Balance Overall balance assessment: Needs assistance   Sitting balance-[name]: Normal     Standing balance support: Bilateral upper extremity supported, During functional activity Standing balance-[name]: Good  Pertinent Vitals/Pain Pain Assessment Pain Assessment: 0-10 Pain Score: 2  Pain Location: L hip from overdoing exercises with HHPT per patient Pain Descriptors / Indicators: Sore Pain Intervention(s): Repositioned, Premedicated before session, Monitored during session     Home Living Family/patient expects to be discharged to:: Private residence Living Arrangements: Alone Available Help at Discharge: Family;Available 24 hours/day Type of Home: Apartment Home Access: Level entry       Home Layout: One level Home Equipment: Rollator (4 wheels);Cane - single point;Grab bars - tub/shower;Grab bars - toilet;Lift chair;Transport chair;Tub bench;Rolling Walker (2 wheels);Shower seat Additional Comments: Senior living apartment with granddaughter able to stay with patient 24/7 as needed, has call bells in multiple rooms for help if needed    Prior Function Prior Level of Function : Needs assist             Mobility Comments: Mod Ind amb in her senior living apt with a rollator, family assists with transport chair for community access, no falls in the last 6 months with last fall being in July of 2024 ADLs Comments: Seated shower, mod indep with dressing, indep with meals, meds. Family assists with groceries, driving, trash, and mail     Extremity/Trunk Assessment   Upper Extremity Assessment Upper Extremity Assessment: Generalized weakness    Lower Extremity Assessment Lower Extremity Assessment: Generalized weakness       Communication   Communication Communication: No apparent difficulties    Cognition Arousal: Alert Behavior During Therapy: WFL for tasks assessed/performed   PT - Cognitive impairments: No apparent impairments                         Following commands: Intact       Cueing Cueing Techniques: Verbal cues     General Comments      Exercises     Assessment/Plan    PT Assessment Patient needs continued PT services  PT Problem List Decreased strength;Decreased activity tolerance;Decreased balance;Decreased mobility;Decreased knowledge of use of DME;Pain       PT Treatment Interventions DME instruction;Gait training;Functional mobility training;Therapeutic activities;Therapeutic exercise;Balance  training;Patient/family education    PT Goals (Current goals can be found in the Care Plan section)  Acute Rehab PT Goals Patient Stated Goal: To get a good night sleep and return home PT Goal Formulation: With patient Time For Goal Achievement: 09/28/23 Potential to Achieve Goals: Good    Frequency Min 1X/week     Co-evaluation               AM-PAC PT "6 Clicks" Mobility  Outcome Measure Help needed turning from your back to your side while in a flat bed without using bedrails?: A Little Help needed moving from lying on your back to sitting on the side of a flat bed without using bedrails?: A Little Help needed moving to and from a bed to a chair (including a wheelchair)?: A Little Help needed standing up from a chair using your arms (e.g., wheelchair or bedside chair)?: A Little Help needed to walk in hospital room?: A Lot Help needed climbing 3-5 steps with a railing? : A Lot 6 Click Score: 16    End of Session Equipment Utilized During Treatment: Gait belt Activity Tolerance: Patient tolerated treatment well Patient left: in bed;with call bell/phone within reach;with bed alarm set;with nursing/sitter in room Nurse Communication: Mobility status PT Visit Diagnosis: Difficulty in walking, not elsewhere classified (R26.2);Muscle weakness (generalized) (M62.81);Pain Pain - Right/Left: Left Pain - part  of body: Hip    Time: 1549-1609 PT Time Calculation (min) (ACUTE ONLY): 20 min   Charges:   PT Evaluation $PT Eval Moderate Complexity: 1 Mod   PT General Charges $$ ACUTE PT VISIT: 1 Visit    D. Elly Modena PT, DPT 09/15/23, 4:35 PM

## 2023-09-15 NOTE — ED Triage Notes (Signed)
 BIBA for c/o shob and weakness. Pt rc'd 1 duo/1alb PTA. Pt A/Ox4. Pt resp equal and non-labored.PMH COPD

## 2023-09-15 NOTE — ED Provider Notes (Signed)
 [facility] Provider Note    Event Date/Time   First MD Initiated Contact with Patient 09/15/23 [phone]     (approximate)   History   Shortness of Breath and Weakness   HPI  Leslie Parker is a 89 y.o. female with a history of COPD, diabetes, hypertension, CKD, sick sinus syndrome presents to the ER for evaluation of shortness of breath and generalized malaise over the past 24 hours.  Patient woke up with shortness of breath and feeling weak.  EMS was called.  Found to be hypoxic in the 70s on room air.  Does not wear home oxygen.  Was given nebulizer treatment and route with some improvement.  She is now satting well on 3 L nasal cannula.  She denies any nausea or vomiting.  Denies any abdominal pain.     Physical Exam   Triage Vital Signs: ED Triage Vitals  Encounter Vitals Group     BP 09/15/23 0641 (!) 161/140     Systolic BP Percentile --      Diastolic BP Percentile --      Pulse Rate 09/15/23 0641 96     Resp 09/15/23 0641 20     Temp 09/15/23 0641 97.6 F (36.4 C)     Temp Source 09/15/23 0641 Axillary     SpO2 09/15/23 0641 93 %     Weight 09/15/23 0642 165 lb (74.8 kg)     Height 09/15/23 0642 5\' 9"  (1.753 m)     Head Circumference --      Peak Flow --      Pain Score 09/15/23 0642 0     Pain Loc --      Pain Education --      Exclude from Growth Chart --     Most recent vital signs: Vitals:   09/15/23 0700 09/15/23 0730  BP: (!) 154/93 (!) 158/86  Pulse: 86 75  Resp: (!) 23 19  Temp:    SpO2:       Constitutional: Alert  Eyes: Conjunctivae are normal.  Head: Atraumatic. Nose: No congestion/rhinnorhea. Mouth/Throat: Mucous membranes are moist.   Neck: Painless ROM.  Cardiovascular:   Good peripheral circulation. Respiratory: Apnea with prolonged expiratory phase.  Diminished breath sounds throughout. Gastrointestinal: Soft and nontender.  Musculoskeletal:  no deformity Neurologic:  MAE spontaneously. No gross focal  neurologic deficits are appreciated.  Skin:  Skin is warm, dry and intact. No rash noted. Psychiatric: Mood and affect are normal. Speech and behavior are normal.    ED Results / Procedures / Treatments   Labs (all labs ordered are listed, but only abnormal results are displayed) Labs Reviewed  CBC WITH DIFFERENTIAL/PLATELET - Abnormal; Notable for the following components:      Result Value   RDW 16.6 (*)    All other components within normal limits  BASIC METABOLIC PANEL - Abnormal; Notable for the following components:   Sodium 128 (*)    Chloride 94 (*)    CO2 20 (*)    Glucose, Bld 180 (*)    BUN 47 (*)    Creatinine, Ser 2.43 (*)    Calcium 8.5 (*)    GFR, Estimated 19 (*)    All other components within normal limits  BRAIN NATRIURETIC PEPTIDE - Abnormal; Notable for the following components:   B Natriuretic Peptide >4,500.0 (*)    All other components within normal limits  BLOOD GAS, VENOUS - Abnormal; Notable for the following components:  pCO2, Ven 38 (*)    pO2, Ven 58 (*)    All other components within normal limits  TROPONIN I (HIGH SENSITIVITY) - Abnormal; Notable for the following components:   Troponin I (High Sensitivity) 84 (*)    All other components within normal limits  RESP PANEL BY RT-PCR (RSV, FLU A&B, COVID)  [id number]  URINALYSIS, ROUTINE W REFLEX MICROSCOPIC     EKG  ED ECG REPORT I, Willy Eddy, the attending physician, personally viewed and interpreted this ECG.   Date: 09/15/2023  EKG Time: 6:44  Rate: 90  Rhythm: sinus  Axis: left  Intervals:lbbb  ST&T Change: no sgarbossa, abnl ekg    RADIOLOGY Please see ED Course for my review and interpretation.  I personally reviewed all radiographic images ordered to evaluate for the above acute complaints and reviewed radiology reports and findings.  These findings were personally discussed with the patient.  Please see medical record for radiology report.    PROCEDURES:  Critical  Care performed: Yes, see critical care procedure note(s)  .Critical Care  Performed by: Willy Eddy, MD Authorized by: Willy Eddy, MD   Critical care provider statement:    Critical care time (minutes):  40   Critical care was necessary to treat or prevent imminent or life-threatening deterioration of the following conditions:  Respiratory failure   Critical care was time spent personally by me on the following activities:  Ordering and performing treatments and interventions, ordering and review of laboratory studies, ordering and review of radiographic studies, pulse oximetry, re-evaluation of patient's condition, review of old charts, obtaining history from patient or surrogate, examination of patient, evaluation of patient's response to treatment, discussions with primary provider, discussions with consultants and development of treatment plan with patient or surrogate    MEDICATIONS ORDERED IN ED: Medications  furosemide (LASIX) injection 60 mg (has no administration in time range)  ipratropium-albuterol (DUONEB) 0.5-2.5 (3) MG/3ML nebulizer solution 3 mL (3 mLs Nebulization Given 09/15/23 0656)  methylPREDNISolone sodium succinate (SOLU-MEDROL) 125 mg/2 mL injection 125 mg (125 mg Intravenous Given 09/15/23 0656)     IMPRESSION / MDM / ASSESSMENT AND PLAN / ED COURSE  I reviewed the triage vital signs and the nursing notes.                              Differential diagnosis includes, but is not limited to, Asthma, copd, CHF, pna, ptx, malignancy, Pe, anemia  Patient presenting to the ER for evaluation of symptoms as described above.  Based on symptoms, risk factors and considered above differential, this presenting complaint could reflect a potentially life-threatening illness therefore the patient will be placed on continuous pulse oximetry and telemetry for monitoring.  Laboratory evaluation will be sent to evaluate for the above complaints.      Clinical Course as  of 09/15/23 0800  Sun Sep 15, 2023  1308 Chest x-ray on my review and interpretation without evidence of pneumothorax, findings concerning for CHF. [PR]  0800 BNP significantly elevated.  Troponin mildly elevated likely demand ischemia.  Looks more comfortable here.  I have ordered IV Lasix as workup appears more consistent with pulmonary edema and acute on chronic CHF.  Will consult hospitalist for admission given her acute hypoxia requiring supplemental oxygen. [PR]    Clinical Course User Index [PR] Willy Eddy, MD     FINAL CLINICAL IMPRESSION(S) / ED DIAGNOSES   Final diagnoses:  Acute respiratory failure with hypoxia (  HCC)  Acute on chronic congestive heart failure, unspecified heart failure type (HCC)     Rx / DC Orders   ED Discharge Orders     None        Note:  This document was prepared using Dragon voice recognition software and may include unintentional dictation errors.    Willy Eddy, MD 09/15/23 0800

## 2023-09-15 NOTE — ED Notes (Signed)
 Advised nurse that patient has ready bed

## 2023-09-15 NOTE — Consult Note (Addendum)
 Consultation Note Date: 09/15/2023   Patient Name: Leslie Parker  DOB: 1932-08-26  MRN: [id number]  Age / Sex: 89 y.o., female  PCP: [name], MD Referring Physician: Emeline General, MD  Reason for Consultation: Establishing goals of care  HPI/Patient Profile:  Leslie Parker is a 89 y.o. female with medical history significant of chronic HFrEF with LVEF 25-30%, SSS status post PPM, PAF on Eliquis, CKD stage IV, HTN, IIDM, presented with increasing shortness of breath and generalized weakness.   Clinical Assessment and Goals of Care: Notes and labs reviewed including previous hospitalizations.  In to see patient.  She is currently resting in bed, no family at bedside.  She states she lives alone in a handicap accessible apartment.  She states her granddaughter [name] is her healthcare power of attorney; the H POA form is available under ACP tab.  She states she has been divorced since 69.  She states she has 3 of 4 children who are living.  She discusses that her son is currently being treated for esophageal cancer.  She states she has a daughter Misty Stanley who lives in [place], whom she states is difficult to talk to at times.  She states she has a daughter Lowella Bandy who lives in [place].  She discusses that Lowella Bandy has lost 3 of her 5 children.  She states [name] is [place] daughter.  She states her ex husband was in the [employer].  She states she has lived in [place], [place], [place], and [place].  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  She discusses that she got to meet President Montez Morita when he landed there [place].  She discusses that at baseline she uses a walker in the house, a wheelchair when leaving the house, and no O2 use.  She states she is able to microwave foods and do some things around the house.   Ms. Mccuistion discusses her previous hospitalizations.   She discusses her admissions due to her heart problems and the need for pacemaker.  She discusses her hip fracture repair and subsequent admissions for CHF, pneumonia, and diverticulitis.  She states with diverticulitis, she has had to make modifications to the things that she eats which has been difficult.  She states she takes her medications as she is supposed to.  She states she is hopeful that her status will improve.  We discussed the trend of her BNP and the trend of her renal function.  She states she has known people who have had dialysis but she is not sure if she would want this for herself.  Broached quality of life and goals of care.  She changes subject and does not discuss any further goals of care at this time.    SUMMARY OF RECOMMENDATIONS   PMT will follow   Prognosis:  Poor overall      Primary Diagnoses: Present on Admission:  Acute combined systolic (congestive) and diastolic (congestive) heart failure (HCC) - LVEF 25%  Acute on  chronic diastolic CHF (congestive heart failure) (HCC)  Acute respiratory failure with hypoxia (HCC)   I have reviewed the medical record, interviewed the patient and family, and examined the patient. The following aspects are pertinent.  Past Medical History:  Diagnosis Date   Acute kidney injury superimposed on CKD (HCC) 11/24/2022   Asthma    Atrial fibrillation with RVR (HCC)    Cancer (HCC)    Basal Cell   Closed left hip fracture (HCC) 01/25/2017   Closed right hip fracture (HCC) 02/13/2023   Diabetes mellitus without complication (HCC)    Femur fracture, left (HCC) 02/03/2015   Heart murmur    Hypertension    Impetigo    Osteoarthritis    Osteopenia    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   Rapid atrial fibrillation, new onset(HCC) 06/05/2022   Vomiting    can not due to surgery   Wears dentures    full upper and lower   Social History   Socioeconomic History   Marital status: Divorced    Spouse name: Not on file    Number of children: 4   Years of education: Not on file   Highest education level: 12th grade  Occupational History   Occupation: Retitred/Disability  Tobacco Use   Smoking status: Never   Smokeless tobacco: Never  Vaping Use   Vaping status: Never Used  Substance and Sexual Activity   Alcohol use: No    Alcohol/week: 0.0 standard drinks of alcohol   Drug use: No   Sexual activity: Never  Other Topics Concern   Not on file  Social History Narrative   Not on file   Social Drivers of Health   Financial Resource Strain: Low Risk  (08/20/2023)   Overall Financial Resource Strain (CARDIA)    Difficulty of Paying Living Expenses: Not hard at all  Food Insecurity: No Food Insecurity (08/20/2023)   Hunger Vital Sign    Worried About Running Out of Food in the Last Year: Never true    Ran Out of Food in the Last Year: Never true  Transportation Needs: No Transportation Needs (08/20/2023)   PRAPARE - Administrator, Civil Service (Medical): No    Lack of Transportation (Non-Medical): No  Physical Activity: Not on file  Stress: Not on file  Social Connections: Moderately Integrated (08/20/2023)   Social Connection and Isolation Panel [NHANES]    Frequency of Communication with Friends and Family: Twice a week    Frequency of Social Gatherings with Friends and Family: Three times a week    Attends Religious Services: 1 to 4 times per year    Active Member of Clubs or Organizations: No    Attends Engineer, structural: 1 to 4 times per year    Marital Status: Divorced   Family History  Problem Relation Age of Onset   Heart failure Mother    Hypertension Mother    Emphysema Father    Scheduled Meds:  amiodarone  200 mg Oral Daily   apixaban  2.5 mg Oral BID   carvedilol  6.25 mg Oral BID WC   colestipol  1 g Oral BID   empagliflozin  10 mg Oral Daily   feeding supplement  237 mL Oral BID BM   fluticasone furoate-vilanterol  1 puff Inhalation Daily   And    umeclidinium bromide  1 puff Inhalation Daily   furosemide  40 mg Intravenous BID   hydrALAZINE  50 mg Oral TID   isosorbide mononitrate  30 mg Oral QHS   pantoprazole  40 mg Oral Daily   sodium chloride flush  3 mL Intravenous Q12H   traZODone  100 mg Oral QHS   Continuous Infusions:  sodium chloride     PRN Meds:.sodium chloride, acetaminophen, albuterol, ondansetron (ZOFRAN) IV, ondansetron, sodium chloride flush, traMADol Medications Prior to Admission:  Prior to Admission medications   Medication Sig Start Date End Date Taking? Authorizing Provider  albuterol (VENTOLIN HFA) 108 (90 Base) MCG/ACT inhaler Inhale 2 puffs into the lungs every 6 (six) hours as needed. 08/04/19  Yes [provider]  amiodarone (PACERONE) 200 MG tablet Take 1 tablet by mouth daily. 08/26/23  Yes [provider]  apixaban (ELIQUIS) 2.5 MG TABS tablet Take 1 tablet (2.5 mg total) by mouth 2 (two) times daily. 06/07/22  Yes Sreenath, Sudheer B, MD  ascorbic acid (VITAMIN C) 250 MG CHEW Chew 250 mg by mouth daily.   Yes [provider]  carvedilol (COREG) 6.25 MG tablet Take 1 tablet (6.25 mg total) by mouth 2 (two) times daily with a meal. 08/11/23 11/09/23 Yes Carollee Herter, DO  Cholecalciferol (VITAMIN D-1000 MAX ST) 25 MCG (1000 UT) tablet Take 1,000 Units by mouth daily.   Yes [provider]  colestipol (COLESTID) 1 g tablet Take 1 tablet by mouth 2 (two) times daily. 06/01/22  Yes [provider]  Cyanocobalamin (B-12) 2500 MCG TABS Take 2,500 mcg by mouth daily.   Yes [provider]  empagliflozin (JARDIANCE) 10 MG TABS tablet Take 1 tablet (10 mg total) by mouth daily. 08/12/23 11/10/23 Yes Carollee Herter, DO  furosemide (LASIX) 20 MG tablet Take 20 mg by mouth daily as needed. 08/16/23  Yes [provider]  hydrALAZINE (APRESOLINE) 50 MG tablet Take 1 tablet (50 mg total) by mouth 3 (three) times daily. 08/11/23 11/09/23 Yes Carollee Herter, DO   ipratropium-albuterol (DUONEB) 0.5-2.5 (3) MG/3ML SOLN Inhale 3 mLs into the lungs every 6 (six) hours as needed. 06/01/22  Yes [provider]  isosorbide mononitrate (IMDUR) 30 MG 24 hr tablet Take 1 tablet (30 mg total) by mouth at bedtime. 08/11/23 11/09/23 Yes Carollee Herter, DO  Magnesium Oxide 500 MG CAPS Take 500 mg by mouth daily.   Yes [provider]  metoprolol tartrate (LOPRESSOR) 50 MG tablet Take 50 mg by mouth 2 (two) times daily. 08/16/23  Yes [provider]  Multiple Vitamin (MULTIVITAMIN WITH MINERALS) TABS tablet Take 1 tablet by mouth daily. One-A-Day Women's Vitamin   Yes [provider]  omeprazole (PRILOSEC) 20 MG capsule Take 20 mg by mouth every morning. 01/10/23  Yes [provider]  traZODone (DESYREL) 100 MG tablet Take 100 mg by mouth at bedtime.   Yes [provider]  TRELEGY ELLIPTA 100-62.5-25 MCG/ACT AEPB Inhale 1 puff into the lungs daily. 08/03/22  Yes [provider]  furosemide (LASIX) 40 MG tablet Take 1 tablet (40 mg total) by mouth in the morning. Patient not taking: Reported on 09/15/2023 08/11/23 11/09/23  Carollee Herter, DO  ondansetron (ZOFRAN-ODT) 4 MG disintegrating tablet Take 4 mg by mouth every 8 (eight) hours as needed. Patient not taking: Reported on 09/15/2023 05/21/23   [provider]  traMADol (ULTRAM) 50 MG tablet Take 1 tablet by mouth every 6 (six) hours as needed. Patient not taking: Reported on 09/15/2023 07/01/23   [provider]   Allergies  Allergen Reactions   Baclofen Other (See Comments)    Elevated Cr   Simvastatin Other (  See Comments)    Pt denies  Elevated LFTs with simva 40mg , stopped and resolved (see MD note 11/10/13)   Review of Systems  All other systems reviewed and are negative.   Physical Exam Pulmonary:     Effort: Pulmonary effort is normal.  Skin:    General: Skin is warm and dry.  Neurological:     Mental Status: She is alert.      Vital Signs: BP (!) 166/88   Pulse 65   Temp 97.6 F (36.4 C) (Axillary)   Resp (!) 24   Ht 5\' 9"  (1.753 m)   Wt 74.8 kg   SpO2 100%   BMI 24.37 kg/m  Pain Scale: 0-10   Pain Score: 0-No pain   SpO2: SpO2: 100 % O2 Device:SpO2: 100 % O2 Flow Rate: .O2 Flow Rate (L/min): 2 L/min  IO: Intake/output summary: No intake or output data in the 24 hours ending 09/15/23 1508  LBM:   Baseline Weight: Weight: 74.8 kg Most recent weight: Weight: 74.8 kg      Signed by: Morton Stall, NP   Please contact Palliative Medicine Team phone at [phone] for questions and concerns.  For individual provider: See Loretha Stapler

## 2023-09-15 NOTE — ED Notes (Signed)
 CCMD called and pt placed on tele.

## 2023-09-16 DIAGNOSIS — I480 Paroxysmal atrial fibrillation: Secondary | ICD-10-CM | POA: Diagnosis not present

## 2023-09-16 DIAGNOSIS — N184 Chronic kidney disease, stage 4 (severe): Secondary | ICD-10-CM

## 2023-09-16 DIAGNOSIS — I5041 Acute combined systolic (congestive) and diastolic (congestive) heart failure: Secondary | ICD-10-CM

## 2023-09-16 DIAGNOSIS — Z7189 Other specified counseling: Secondary | ICD-10-CM | POA: Diagnosis not present

## 2023-09-16 LAB — GLUCOSE, CAPILLARY
Glucose-Capillary: 190 mg/dL — ABNORMAL HIGH (ref 70–99)
Glucose-Capillary: 208 mg/dL — ABNORMAL HIGH (ref 70–99)

## 2023-09-16 LAB — BASIC METABOLIC PANEL
Anion gap: 10 (ref 5–15)
BUN: 49 mg/dL — ABNORMAL HIGH (ref 8–23)
CO2: 24 mmol/L (ref 22–32)
Calcium: 8.2 mg/dL — ABNORMAL LOW (ref 8.9–10.3)
Chloride: 100 mmol/L (ref 98–111)
Creatinine, Ser: 2.38 mg/dL — ABNORMAL HIGH (ref 0.44–1.00)
GFR, Estimated: 19 mL/min — ABNORMAL LOW (ref >60)
Glucose, Bld: 157 mg/dL — ABNORMAL HIGH (ref 70–99)
Potassium: 3.4 mmol/L — ABNORMAL LOW (ref 3.5–5.1)
Sodium: 134 mmol/L — ABNORMAL LOW (ref 135–145)

## 2023-09-16 MED ORDER — POTASSIUM CHLORIDE CRYS ER 20 MEQ PO TBCR
40.0000 meq | EXTENDED_RELEASE_TABLET | ORAL | Status: AC
  Administered 2023-09-16 (×2): 40 meq via ORAL
  Filled 2023-09-16 (×2): qty 2

## 2023-09-16 MED ORDER — INSULIN ASPART 100 UNIT/ML IJ SOLN
0.0000 [IU] | Freq: Three times a day (TID) | INTRAMUSCULAR | Status: DC
  Administered 2023-09-16 – 2023-09-18 (×4): 1 [IU] via SUBCUTANEOUS
  Filled 2023-09-16 (×4): qty 1

## 2023-09-16 MED ORDER — FUROSEMIDE 10 MG/ML IJ SOLN
4.0000 mg/h | INTRAVENOUS | Status: DC
  Administered 2023-09-16 – 2023-09-18 (×2): 4 mg/h via INTRAVENOUS
  Filled 2023-09-16: qty 10
  Filled 2023-09-16: qty 20

## 2023-09-16 MED ORDER — ADULT MULTIVITAMIN W/MINERALS CH
1.0000 | ORAL_TABLET | Freq: Every day | ORAL | Status: DC
  Administered 2023-09-16 – 2023-09-19 (×4): 1 via ORAL
  Filled 2023-09-16 (×3): qty 1

## 2023-09-16 MED ORDER — GUAIFENESIN-DM 100-10 MG/5ML PO SYRP
10.0000 mL | ORAL_SOLUTION | Freq: Once | ORAL | Status: AC
Start: 2023-09-16 — End: 2023-09-16
  Administered 2023-09-16: 10 mL via ORAL
  Filled 2023-09-16: qty 10

## 2023-09-16 NOTE — Progress Notes (Signed)
 OT Cancellation Note  Patient Details Name: KAYLIE RITTER MRN: [id number] DOB: 1933/06/25   Cancelled Treatment:    Reason Eval/Treat Not Completed: Patient declined, no reason specified. Orders received, chart reviewed. Pt politely yet firmly refuses participation in OT eval as she prefers to have breakfast first. OT offers ADLs or up to chair with pt continuing to decline. OT will continue to follow and check back as able.  Sevag Shearn L. Syna Gad, OTR/L  09/16/23, 9:14 AM

## 2023-09-16 NOTE — Hospital Course (Signed)
 Leslie Parker is a 89 y.o. female with medical history significant of chronic HFrEF with LVEF 25-30%, SSS status post PPM, PAF on Eliquis, CKD stage IV, HTN, IIDM, presented with increasing shortness of breath and generalized weakness.  Chest x-ray showed evidence of significant volume overload.  Profound elevation BNP.  Patient was started on IV Lasix.

## 2023-09-16 NOTE — Progress Notes (Signed)
  Progress Note   Patient: Leslie Parker NGE:[id number] DOB: 28-Nov-1932 DOA: 09/15/2023     1 DOS: the patient was seen and examined on 09/16/2023   Brief hospital course: TOMA ERICHSEN is a 89 y.o. female with medical history significant of chronic HFrEF with LVEF 25-30%, SSS status post PPM, PAF on Eliquis, CKD stage IV, HTN, IIDM, presented with increasing shortness of breath and generalized weakness.  Chest x-ray showed evidence of significant volume overload.  Profound elevation BNP.  Patient was started on IV Lasix.   Active Problems:   Acute respiratory failure with hypoxia (HCC)   Acute on chronic diastolic CHF (congestive heart failure) (HCC)   Acute combined systolic (congestive) and diastolic (congestive) heart failure (HCC) - LVEF 25%   Paroxysmal atrial fibrillation (HCC)   COPD (chronic obstructive pulmonary disease) (HCC)   Essential hypertension   CKD (chronic kidney disease) stage 4, GFR 15-29 ml/min (HCC)   Hypokalemia   Type II diabetes mellitus with renal manifestations (HCC)   Hyponatremia   CHF (congestive heart failure) (HCC)   Assessment and Plan: Acute on chronic combined systolic diastolic congestive heart failure with ejection fraction 25%-30%. Elevated troponin secondary to congestive heart failure exacerbation. Acute respite failure ruled out. Patient had a profound volume overload, this is secondary to acute exacerbation of systolic congestive heart failure. Patient was placed on 2 L oxygen, but there is no documentation of hypoxemia. Patient was given IV Lasix, due to stage IV chronic kidney disease, I changed to Lasix drip.  Continue to monitor renal function closely. PDMT is limited due to advanced renal disease.  Currently on Jardiance, and Coreg.  Chronic kidney disease stage IV. Hyponatremia Hypokalemia. Sodium level appears to be better after giving Lasix, potassium will be repleted.  Continue monitor renal function as patient is receiving IV Lasix  drip.  Paroxysmal atrial fibrillation. Continue Eliquis and amiodarone.  Type 2 diabetes. Start sliding scale insulin.  Anorexia. Likely secondary to congestive heart failure, continue to follow.  COPD  No evidence of bronchospasm.     Subjective: Still complaining short of breath with exertion.  Physical Exam: Vitals:   09/16/23 0456 09/16/23 0500 09/16/23 0745 09/16/23 1152  BP: 135/70  (!) 151/70 (!) 143/78  Pulse: 64  63 62  Resp: 18  20 20   Temp: 98.4 F (36.9 C)  98.2 F (36.8 C) 98.5 F (36.9 C)  TempSrc: Oral  Oral Oral  SpO2: 95%  98% 94%  Weight:  78 kg    Height:       General exam: Appears calm and comfortable  Respiratory system: Decreased breath sounds. Respiratory effort normal. Cardiovascular system: S1 & S2 heard, RRR. No JVD, murmurs, rubs, gallops or clicks. No pedal edema. Gastrointestinal system: Abdomen is nondistended, soft and nontender. No organomegaly or masses felt. Normal bowel sounds heard. Central nervous system: Alert and oriented. No focal neurological deficits. Extremities: Symmetric 5 x 5 power. Skin: No rashes, lesions or ulcers Psychiatry: Judgement and insight appear normal. Mood & affect appropriate.    Data Reviewed:  X-ray and lab results reviewed.  Family Communication: None  Disposition: Status is: Inpatient Remains inpatient appropriate because: Severity of disease, IV treatment.     Time spent: 50 minutes  Author: Marrion Coy, MD 09/16/2023 1:20 PM  For on call review [email or web address].

## 2023-09-16 NOTE — TOC Initial Note (Signed)
 Transition of Care [facility]) - Initial/Assessment Note    Patient Details  Name: Leslie Parker MRN: [id number] Date of Birth: 05-02-1933  Transition of Care [facility]) CM/SW Contact:    Margarito Liner, LCSW Phone Number: 09/16/2023, 2:54 PM  Clinical Narrative:  Readmission prevention screen complete. CSW met with patient. No supports at bedside. CSW introduced role and explained that discharge planning would be discussed. PCP is Dr. Nemiah Commander. Daughter or granddaughter transport her to appointments. [facility] [place]. No issues obtaining medications. Patient lives home alone in a senior-living apartment. She was recently discharged from [facility] by choice. She has an upcoming appointment with an orthopedic provider for her back and wants to follow their treatment plan for further therapy. She has a cane, rollator, wheelchair, BSC, built-in shower chair, and grab bars in the shower. There are also chains she can pull in the apartment in case she needs help. She does not use oxygen at home but is currently on 1 L. Will follow for this potential home need. No further concerns. CSW will continue to follow patient for support and facilitate return home once stable. Daughter or granddaughter will transport her home at discharge.               Expected Discharge Plan: Home/Self Care Barriers to Discharge: Continued Medical Work up   Patient Goals and CMS Choice            Expected Discharge Plan and Services     Post Acute Care Choice: NA Living arrangements for the past 2 months: Apartment                                      Prior Living Arrangements/Services Living arrangements for the past 2 months: Apartment Lives with:: Self Patient language and need for interpreter reviewed:: Yes Do you feel safe going back to the place where you live?: Yes      Need for Family Participation in Patient Care: Yes (Comment)   Current home services: DME Criminal  Activity/Legal Involvement Pertinent to Current Situation/Hospitalization: No - Comment as needed  Activities of Daily Living   ADL Screening (condition at time of admission) Independently performs ADLs?: No Does the patient have a NEW difficulty with bathing/dressing/toileting/self-feeding that is expected to last >3 days?: No Does the patient have a NEW difficulty with getting in/out of bed, walking, or climbing stairs that is expected to last >3 days?: No Does the patient have a NEW difficulty with communication that is expected to last >3 days?: No Is the patient deaf or have difficulty hearing?: No Does the patient have difficulty seeing, even when wearing glasses/contacts?: No Does the patient have difficulty concentrating, remembering, or making decisions?: No  Permission Sought/Granted                  Emotional Assessment Appearance:: Appears stated age Attitude/Demeanor/Rapport: Engaged, Gracious Affect (typically observed): Appropriate, Calm, Pleasant Orientation: : Oriented to Self, Oriented to Place, Oriented to  Time, Oriented to Situation Alcohol / Substance Use: Not Applicable Psych Involvement: No (comment)  Admission diagnosis:  CHF (congestive heart failure) (HCC) [I50.9] Acute respiratory failure with hypoxia (HCC) [J96.01] Acute on chronic congestive heart failure, unspecified heart failure type [facility]) [I50.9] Patient Active Problem List   Diagnosis Date Noted   CHF (congestive heart failure) (HCC) 09/15/2023   Acute on chronic systolic CHF (congestive heart failure) (  HCC) 08/19/2023   Pleural effusion due to CHF (congestive heart failure) (HCC) 08/19/2023   HTN (hypertension) 08/19/2023   Ileus (HCC) 08/12/2023   Large bowel obstruction (HCC) 08/12/2023   CKD (chronic kidney disease) stage 4, GFR 15-29 ml/min (HCC) 08/12/2023   HFrEF (heart failure with reduced ejection fraction) (HCC) 08/12/2023   Acute combined systolic (congestive) and diastolic  (congestive) heart failure (HCC) - LVEF 25% 08/10/2023   Hyponatremia 05/05/2023   Overweight (BMI 25.0-29.9) 05/01/2023   Left hip pain 05/01/2023   Hypokalemia 04/30/2023   Anemia 03/21/2023   Abnormal LFTs 03/20/2023   Type II diabetes mellitus with renal manifestations (HCC) 02/13/2023   Chronic diastolic CHF (congestive heart failure) (HCC) 02/13/2023   CKD stage 3 due to type 2 diabetes mellitus (HCC) 11/24/2022   Paroxysmal atrial fibrillation (HCC) 11/24/2022   Sick sinus syndrome (HCC) 11/15/2022   Acute on chronic diastolic CHF (congestive heart failure) (HCC) 08/27/2022   COPD (chronic obstructive pulmonary disease) (HCC) 08/23/2022   Chronic a-fib (HCC) 08/22/2022   Diabetes mellitus without complication (HCC)    Stage 3b chronic kidney disease (HCC) - Baseline scr 1.8-2.0    Postural dizziness with presyncope    Hypomagnesemia    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 [place] [address] Phone: [phone] Fax: [phone]  [id number], [place] - [address] [place] [address] Phone: [phone] Fax: [phone]     Social Drivers of Health (SDOH) Social History: SDOH Screenings   Food Insecurity: No Food Insecurity (09/15/2023)  Housing: Low Risk  (09/15/2023)  Transportation Needs: No Transportation Needs (09/15/2023)  Utilities: Not At Risk (09/15/2023)  Alcohol Screen: Low Risk  (08/20/2023)  Financial Resource Strain: Low Risk  (08/20/2023)  Social Connections: Moderately Integrated (09/15/2023)  Tobacco Use: Low Risk  (09/15/2023)   SDOH Interventions:     Readmission Risk Interventions    09/16/2023    2:52 PM 08/20/2023    3:45 PM 08/16/2023   12:42 PM  Readmission Risk Prevention Plan   Transportation Screening Complete Complete Complete  Medication Review Oceanographer) Complete Complete Complete  PCP or Specialist appointment within 3-5 days of discharge Complete Complete   HRI or Home Care Consult Patient refused Complete Complete  SW Recovery Care/Counseling Consult Complete Complete Complete  Palliative Care Screening Not Applicable Not Applicable Not Applicable  Skilled Nursing Facility Not Applicable Not Applicable Not Applicable

## 2023-09-16 NOTE — Progress Notes (Addendum)
 Daily Progress Note   Patient Name: Leslie Parker       Date: 09/16/2023 DOB: [birth year]  Age: 89 y.o. MRN#: [id number] Attending Physician: Marrion Coy, MD Primary Care Physician: [name], MD Admit Date: 09/15/2023  Reason for Consultation/Follow-up: Establishing goals of care  Subjective: Notes and labs reviewed.  In to see patient.  She is currently sitting in bed watching television.  She states she is feeling better today.   We discussed her diagnoses, prognosis, GOC, EOL wishes disposition and options.  We discussed the trends of her BNP and her renal function.  A detailed discussion was had today regarding advanced directives.  Concepts specific to code status, artifical feeding and hydration, IV antibiotics and rehospitalization were discussed.  The difference between an aggressive medical intervention path and a comfort care path was discussed.  Values and goals of care important to patient and family were attempted to be elicited.  Discussed importance of supporting autonomy and decision making.  Discussed limitations of medical interventions to prolong quality of life in some situations and discussed the concept of human mortality.  She states she has advanced directives that outline for her granddaughter exactly what she would and would not want.  She states she has handled all of her funeral arrangements as well.  She states she would not want to have dialysis. With further conversation she states she knows she will not live forever but does not want to discuss any of these things as they are too depressing.  She quickly states that she has been asked these questions before, and at this time, she would want any and all care to live as long as possible including CPR.  She  shares she is amenable to rehospitalization as needed. She states her granddaughter will know what to do when the time comes.  She states she is in no way afraid of poor quality of life or death and knows that God will take her when it is the right time.  She advises that she sees 4 different doctors and none of them have had any conversations regarding poor prognosis.  Length of Stay: 1  Current Medications: Scheduled Meds:   amiodarone  200 mg Oral Daily   apixaban  2.5 mg Oral BID   carvedilol  6.25 mg Oral BID WC  colestipol  1 g Oral BID   empagliflozin  10 mg Oral Daily   feeding supplement  237 mL Oral BID BM   fluticasone furoate-vilanterol  1 puff Inhalation Daily   And   umeclidinium bromide  1 puff Inhalation Daily   hydrALAZINE  50 mg Oral TID   isosorbide mononitrate  30 mg Oral QHS   pantoprazole  40 mg Oral Daily   traZODone  100 mg Oral QHS    Continuous Infusions:  furosemide (LASIX) 200 mg in dextrose 5 % 100 mL (2 mg/mL) infusion 4 mg/hr (09/16/23 1015)    PRN Meds: acetaminophen, albuterol, ondansetron (ZOFRAN) IV, ondansetron, mouth rinse, traMADol  Physical Exam Pulmonary:     Effort: Pulmonary effort is normal.  Neurological:     Mental Status: She is alert.             Vital Signs: BP (!) 143/78 (BP Location: Right Arm)   Pulse 62   Temp 98.5 F (36.9 C) (Oral)   Resp 20   Ht 5\' 9"  (1.753 m)   Wt 78 kg   SpO2 94%   BMI 25.39 kg/m  SpO2: SpO2: 94 % O2 Device: O2 Device: Nasal Cannula O2 Flow Rate: O2 Flow Rate (L/min): 1 L/min  Intake/output summary:  Intake/Output Summary (Last 24 hours) at 09/16/2023 1208 Last data filed at 09/16/2023 0456 Gross per 24 hour  Intake --  Output 650 ml  Net -650 ml   LBM:   Baseline Weight: Weight: 74.8 kg Most recent weight: Weight: 78 kg         Patient Active Problem List   Diagnosis Date Noted   CHF (congestive heart failure) (HCC) 09/15/2023   Acute on chronic systolic CHF (congestive  heart failure) (HCC) 08/19/2023   Pleural effusion due to CHF (congestive heart failure) (HCC) 08/19/2023   HTN (hypertension) 08/19/2023   Ileus (HCC) 08/12/2023   Large bowel obstruction (HCC) 08/12/2023   CKD (chronic kidney disease) stage 4, GFR 15-29 ml/min (HCC) 08/12/2023   HFrEF (heart failure with reduced ejection fraction) (HCC) 08/12/2023   Acute combined systolic (congestive) and diastolic (congestive) heart failure (HCC) - LVEF 25% 08/10/2023   Hyponatremia 05/05/2023   Overweight (BMI 25.0-29.9) 05/01/2023   Left hip pain 05/01/2023   Hypokalemia 04/30/2023   Anemia 03/21/2023   Abnormal LFTs 03/20/2023   Type II diabetes mellitus with renal manifestations (HCC) 02/13/2023   Chronic diastolic CHF (congestive heart failure) (HCC) 02/13/2023   CKD stage 3 due to type 2 diabetes mellitus (HCC) 11/24/2022   Paroxysmal atrial fibrillation (HCC) 11/24/2022   Sick sinus syndrome (HCC) 11/15/2022   Acute on chronic diastolic CHF (congestive heart failure) (HCC) 08/27/2022   COPD (chronic obstructive pulmonary disease) (HCC) 08/23/2022   Chronic a-fib (HCC) 08/22/2022   Diabetes mellitus without complication (HCC)    Stage 3b chronic kidney disease (HCC) - Baseline scr 1.8-2.0    Postural dizziness with presyncope    Hypomagnesemia    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  Code Status:    Code Status Orders  (From admission, onward)           Start     Ordered   09/15/23 0830  Full code  Continuous  Question:  By:  Answer:  Consent: discussion documented in EHR   09/15/23 0830           Code Status History     Date Active Date Inactive Code Status Order ID Comments User Context    08/19/2023 2258 08/23/2023 2148 Full Code [id number]  Lorretta Harp, MD ED   08/12/2023 0927 08/16/2023 2054 Full Code [id number]  Floydene Flock, MD ED   08/07/2023 1626 08/11/2023 1912 Full Code [id number]  Floydene Flock, MD ED   06/16/2023 1227 06/19/2023 1856 Full Code [id number]  Emeline General, MD ED   06/04/2023 1527 06/07/2023 1840 Full Code [id number]  Floydene Flock, MD ED   04/30/2023 1659 05/09/2023 2220 Full Code [id number]  Lucile Shutters, MD ED   03/21/2023 0012 04/01/2023 1618 Full Code [id number]  Gertha Calkin, MD ED   02/13/2023 0822 02/15/2023 1825 Full Code [id number]  Lorretta Harp, MD ED   11/24/2022 2339 11/29/2022 1924 Full Code [id number]  Lucile Shutters, MD ED   11/15/2022 0928 11/16/2022 1952 Full Code [id number]  Marcina Millard, MD Inpatient   08/22/2022 2153 08/27/2022 1916 Full Code [id number]  Gertha Calkin, MD ED   06/05/2022 2039 06/07/2022 1712 Full Code [id number]  Andris Baumann, MD ED   09/03/2020 0433 09/05/2020 1806 Full Code [id number]  Mansy, Vernetta Honey, MD ED   12/02/2017 1152 12/04/2017 1731 Full Code [id number]  Lyndle Herrlich, MD Inpatient   01/25/2017 1402 01/28/2017 2028 Full Code [id number]  Milagros Loll, MD ED   01/17/2017 1444 01/18/2017 1754 Full Code [id number]  Juanell Fairly, MD Inpatient   02/04/2015 1534 02/10/2015 2036 Full Code [id number]  Juanell Fairly, MD Inpatient   02/03/2015 1751 02/04/2015 1534 Full Code [id number]  Juanell Fairly, MD Inpatient   02/03/2015 1749 02/03/2015 1750 Full Code [id number]  Juanell Fairly, MD Inpatient   02/03/2015 1338 02/03/2015 1749 Full Code [id number]  Milagros Loll, MD ED       Prognosis:  Poor    Thank you for allowing the Palliative Medicine Team to assist in the care of this patient.    Morton Stall, NP  Please contact Palliative Medicine Team phone at [phone] for questions and concerns.

## 2023-09-16 NOTE — Progress Notes (Signed)
 Heart Failure Navigator Progress Note  Assessed for Heart & Vascular TOC clinic readiness.  Patient does not meet criteria due to current [facility] patient.   Navigator will sign off at this time.  [name], RN, BSN [facility] Heart Failure Navigator Secure Chat Only

## 2023-09-16 NOTE — Progress Notes (Signed)
 Initial Nutrition Assessment  DOCUMENTATION CODES:   Non-severe (moderate) malnutrition in context of chronic illness  INTERVENTION:   -D/c Ensure Enlive po BID, each supplement provides 350 kcal and 20 grams of protein -Magic cup BID with meals, each supplement provides 290 kcal and 9 grams of protein  -MVI with minerals daily -Liberalize diet to 2 gram sodium fro wider variety of meal selections  NUTRITION DIAGNOSIS:   Moderate Malnutrition related to chronic illness (CHF) as evidenced by mild fat depletion, moderate fat depletion, mild muscle depletion, moderate muscle depletion, percent weight loss, edema.  GOAL:   Patient will meet greater than or equal to 90% of their needs  MONITOR:   PO intake, Supplement acceptance  REASON FOR ASSESSMENT:   Consult Assessment of nutrition requirement/status, Calorie Count  ASSESSMENT:   Pt with medical history significant of chronic HFrEF with LVEF 25-30%, SSS status post PPM, PAF on Eliquis, CKD stage IV, HTN, IIDM, presented with increasing shortness of breath and generalized weakness.  Pt admitted with CHF decompensation.   Reviewed I/O's: -650 ml x 24 hours  UOP: 650 ml x 24 hours  Spoke with pt at bedside, who was pleasant and in good spirits today. Pt reports feeling better after working with physical therapy. Pt shares that her appetite has improved since being admitted and consumed almost all of her breakfast. Pt shares that over the past few months she had a poor appetite, as "I didn't have a taste of anything", however, this has improved over the past few weeks. PTA, pt was consuming 2 meals per day (Breakfast: sausage, egg and cheese or bacon, egg, and cheese sandwich) Lunch: vegetable plate. Pt shares that she eats a lot of microwave meals, has eliminated sweets from her diet and uses only salt substitute. She tries to weigh daily, but does not have an accessible scale, but her daughter plans to get her one that is more  lightweight that she can carry.   Pt reports that she has dentures at home, but rarely uses them when eating. Pt denies any difficulty chewing or swallowing foods on current diet and refuses offer of diet downgrade.   Reviewed wt history; pt has experienced a 15.7% wt  loss over the past 3 months, which is significant for time frame. Per pt, her UBW is around 175#. Pt shares that she has lost down to 164# in the past few months, but suspects this is related to diuresis.   Medications reviewed and include lasix.   Discussed importance of good meal and supplement intake to promote healing. Pt has been refusing Ensure, as she does not like it.    Lab Results  Component Value Date   HGBA1C 7.0 (H) 08/08/2023   PTA DM medications are 10 mg jardiance daily.   Labs reviewed: Na: 134, K: 3.4, CBGS: 96-147 (inpatient orders for glycemic control are 10 mg jardiance daily).    NUTRITION - FOCUSED PHYSICAL EXAM:  Flowsheet Row Most Recent Value  Orbital Region Mild depletion  Upper Arm Region Moderate depletion  Thoracic and Lumbar Region No depletion  Buccal Region Moderate depletion  Temple Region Mild depletion  Clavicle Bone Region Moderate depletion  Clavicle and Acromion Bone Region Moderate depletion  Scapular Bone Region Moderate depletion  Dorsal Hand Moderate depletion  Patellar Region Mild depletion  Anterior Thigh Region Mild depletion  Posterior Calf Region Mild depletion  Edema (RD Assessment) Mild  Hair Reviewed  Eyes Reviewed  Mouth Reviewed  Skin Reviewed  Nails Reviewed  Diet Order:   Diet Order             Diet 2 gram sodium Fluid consistency: Thin  Diet effective now                   EDUCATION NEEDS:   Education needs have been addressed  Skin:  Skin Assessment: Reviewed RN Assessment  Last BM:  Unknown  Height:   Ht Readings from Last 1 Encounters:  09/15/23 5\' 9"  (1.753 m)    Weight:   Wt Readings from Last 1 Encounters:   09/16/23 78 kg    Ideal Body Weight:  65.9 kg  BMI:  Body mass index is 25.39 kg/m.  Estimated Nutritional Needs:   Kcal:  1750-1950  Protein:  90-105 grams  Fluid:  1.7-1.9 L    Levada Schilling, RD, LDN, CDCES Registered Dietitian III Certified Diabetes Care and Education Specialist If unable to reach this RD, please use "RD Inpatient" group chat on secure chat between hours of 8am-4 pm daily

## 2023-09-16 NOTE — Evaluation (Signed)
 Occupational Therapy Evaluation Patient Details Name: Leslie Parker MRN: [id number] DOB: Dec 20, 1932 Today's Date: 09/16/2023   History of Present Illness   Pt is an 89 y.o. female with medical history significant of COPD, chronic HFrEF with LVEF 25-30%, SSS status post PPM, PAF on Eliquis, CKD stage IV, HTN, IIDM, and per patient multiple L hip surgeries who presented with increasing shortness of breath and generalized weakness. MD assessment includes: acute respiratory failure with hypoxia, acute on chronic CHF, cardiomyopathy, deconditioning, and unintentional weight loss.     Clinical Impressions PTA, pt reports completing ADLs and mobility mod independent with 4WW, uses transport chair for community mobility. No recent falls, pt lives alone in a senior living apartment with family assisting with IADLs as needed. Pt requires cues for safe performance of bed mobility, minA to correct pt as she tends to lean backwards, unaware of bed rails (pt states she sleeps in lift recliner at home), requires cuing for anterior weight shift during STS transfers likely due to lift chair. Grooming tasks performed standing at sink with unilateral hand support, CGA for dynamic standing balance and setup-minA for task performance (decreased [facility] requiring assist with object manipulation). Functional mobility with RW, CGA for safety, noted to have soiled brief, requires maxA to doff brief and able to complete anterior/posterior pericare standing with minA for thoroughness. Dons underwear with minA to thread over feet and unilateal UE support to pull over hips. Pt would benefit from skilled OT services to address noted impairments and functional limitations (see below for any additional details) in order to maximize safety and independence while minimizing falls risk and caregiver burden. Anticipate the need for follow up [facility] OT services upon acute hospital DC.      If plan is discharge home, recommend the following:    A little help with walking and/or transfers;A little help with bathing/dressing/bathroom;Assistance with cooking/housework;Assist for transportation     Functional Status Assessment   Patient has had a recent decline in their functional status and demonstrates the ability to make significant improvements in function in a reasonable and predictable amount of time.     Equipment Recommendations   None recommended by OT (pt has all necessary DME)      Precautions/Restrictions   Precautions Precautions: Fall Restrictions Weight Bearing Restrictions Per Provider Order: No     Mobility Bed Mobility Overal bed mobility: Needs Assistance Bed Mobility: Rolling, Sidelying to Sit, Sit to Sidelying Rolling: Supervision Sidelying to sit: Min assist     Sit to sidelying: Min assist, Used rails General bed mobility comments: pt requires minA during transitions as she tends to quickly lean back posteriorly and requires minA to correct from leaning back and bumping head on bed rail    Transfers Overall transfer level: Needs assistance Equipment used: Rolling walker (2 wheels) Transfers: Sit to/from Stand Sit to Stand: Contact guard assist           General transfer comment: pt has a lift chair at home, required cues to control descent and for hand placement      Balance Overall balance assessment: Needs assistance Sitting-balance support: Feet supported, No upper extremity supported Sitting balance-[name]: Normal     Standing balance support: During functional activity Standing balance-[name]: Good Standing balance comment: CGA for dynamic standing balance while performing ADLs                           ADL either performed or assessed  with clinical judgement   ADL Overall ADL's : Needs assistance/impaired     Grooming: Wash/dry hands;Wash/dry face;Oral care;Brushing hair;Set up;Standing;Minimal assistance Grooming Details (indicate cue type and  reason): sink level, standing with CGA for standing balance, minA to open toothpaste             Lower Body Dressing: Sit to/from stand;Maximal assistance;Minimal assistance Lower Body Dressing Details (indicate cue type and reason): maxA to doff soiled brief in standing, CGA for standing balance. requires minA to thread underwear over feet, pt able to pull over hips with CGA for standing balance with unilateral UE support on RW Toilet Transfer: Contact guard assist;Ambulation;Rolling walker (2 wheels);Regular Teacher, adult education Details (indicate cue type and reason): clinical judgement Toileting- Clothing Manipulation and Hygiene: Minimal assistance;Sit to/from stand       Functional mobility during ADLs: Contact guard assist;Rolling walker (2 wheels) General ADL Comments: Standing ADLs performed at sink, no LOB      Pertinent Vitals/Pain Pain Assessment Pain Assessment: No/denies pain Pain Score: 0-No pain     Extremity/Trunk Assessment Upper Extremity Assessment Upper Extremity Assessment: Generalized weakness;Right hand dominant (impaired FMC (needed assist with opening/closing toothbrush cap))   Lower Extremity Assessment Lower Extremity Assessment: Defer to PT evaluation       Communication Communication Communication: No apparent difficulties   Cognition Arousal: Alert Behavior During Therapy: Anxious Cognition: No family/caregiver present to determine baseline             OT - Cognition Comments: A&Ox4, perseverates on not being able to get OOB due to IV, hard to redirect                 Following commands: Intact       Cueing  General Comments   Cueing Techniques: Verbal cues              Home Living Family/patient expects to be discharged to:: Private residence Living Arrangements: Alone Available Help at Discharge: Family;Available 24 hours/day Type of Home: Apartment Home Access: Level entry     Home Layout: One level      Bathroom Shower/Tub: Producer, television/film/video: Handicapped height Bathroom Accessibility: Yes How Accessible: Accessible via walker Home Equipment: Rollator (4 wheels);Cane - single point;Grab bars - tub/shower;Grab bars - toilet;Lift chair;Transport chair;Tub bench;Rolling Walker (2 wheels);Shower seat   Additional Comments: Senior living apartment with granddaughter able to stay with patient 24/7 as needed, has call bells in multiple rooms for help if needed      Prior Functioning/Environment Prior Level of Function : Needs assist       Physical Assist : Mobility (physical);ADLs (physical)     Mobility Comments: Mod Ind amb in her senior living apt with a rollator, family assists with transport chair for community access, no falls in the last 6 months with last fall being in July of 2024 ADLs Comments: Seated shower, mod indep with dressing, indep with meals, meds. Family assists with groceries, driving, trash, and mail    OT Problem List: Decreased strength;Decreased activity tolerance;Impaired balance (sitting and/or standing);Decreased knowledge of use of DME or AE;Decreased knowledge of precautions;Cardiopulmonary status limiting activity   OT Treatment/Interventions: Self-care/ADL training;Energy conservation;DME and/or AE instruction;Therapeutic exercise;Therapeutic activities;Patient/family education;Balance training      OT Goals(Current goals can be found in the care plan section)   Acute Rehab OT Goals OT Goal Formulation: With patient Time For Goal Achievement: 09/30/23 Potential to Achieve Goals: Good   OT Frequency:  Min 1X/week  AM-PAC OT "6 Clicks" Daily Activity     Outcome Measure Help from another person eating meals?: None Help from another person taking care of personal grooming?: A Little Help from another person toileting, which includes using toliet, bedpan, or urinal?: A Little Help from another person bathing (including washing,  rinsing, drying)?: A Little Help from another person to put on and taking off regular upper body clothing?: A Little Help from another person to put on and taking off regular lower body clothing?: A Little 6 Click Score: 19   End of Session Equipment Utilized During Treatment: Gait belt;Rolling walker (2 wheels) Nurse Communication: Mobility status  Activity Tolerance: Patient tolerated treatment well Patient left: in bed;with call bell/phone within reach;with bed alarm set;Other (comment) (staff in room)  OT Visit Diagnosis: Muscle weakness (generalized) (M62.81);Unsteadiness on feet (R26.81)                Time: 4401-0272 OT Time Calculation (min): 40 min Charges:  OT General Charges $OT Visit: 1 Visit OT Evaluation $OT Eval Moderate Complexity: 1 Mod OT Treatments $Self Care/Home Management : 23-37 mins  Tekeisha Hakim L. Adiva Boettner, OTR/L  09/16/23, 2:30 PM

## 2023-09-17 DIAGNOSIS — E440 Moderate protein-calorie malnutrition: Secondary | ICD-10-CM | POA: Insufficient documentation

## 2023-09-17 DIAGNOSIS — I480 Paroxysmal atrial fibrillation: Secondary | ICD-10-CM | POA: Diagnosis not present

## 2023-09-17 DIAGNOSIS — N184 Chronic kidney disease, stage 4 (severe): Secondary | ICD-10-CM | POA: Diagnosis not present

## 2023-09-17 DIAGNOSIS — I5041 Acute combined systolic (congestive) and diastolic (congestive) heart failure: Secondary | ICD-10-CM | POA: Diagnosis not present

## 2023-09-17 DIAGNOSIS — Z7189 Other specified counseling: Secondary | ICD-10-CM | POA: Diagnosis not present

## 2023-09-17 LAB — BASIC METABOLIC PANEL
Anion gap: 9 (ref 5–15)
BUN: 56 mg/dL — ABNORMAL HIGH (ref 8–23)
CO2: 24 mmol/L (ref 22–32)
Calcium: 8.1 mg/dL — ABNORMAL LOW (ref 8.9–10.3)
Chloride: 100 mmol/L (ref 98–111)
Creatinine, Ser: 2.52 mg/dL — ABNORMAL HIGH (ref 0.44–1.00)
GFR, Estimated: 18 mL/min — ABNORMAL LOW (ref >60)
Glucose, Bld: 148 mg/dL — ABNORMAL HIGH (ref 70–99)
Potassium: 3.9 mmol/L (ref 3.5–5.1)
Sodium: 133 mmol/L — ABNORMAL LOW (ref 135–145)

## 2023-09-17 LAB — GLUCOSE, CAPILLARY
Glucose-Capillary: 170 mg/dL — ABNORMAL HIGH (ref 70–99)
Glucose-Capillary: 132 mg/dL — ABNORMAL HIGH (ref 70–99)
Glucose-Capillary: 151 mg/dL — ABNORMAL HIGH (ref 70–99)
Glucose-Capillary: 123 mg/dL — ABNORMAL HIGH (ref 70–99)
Glucose-Capillary: 159 mg/dL — ABNORMAL HIGH (ref 70–99)

## 2023-09-17 LAB — MAGNESIUM: Magnesium: 1.6 mg/dL — ABNORMAL LOW (ref 1.7–2.4)

## 2023-09-17 LAB — RESP PANEL BY RT-PCR (RSV, FLU A&B, COVID)  RVPGX2
Influenza A by PCR: NEGATIVE
Influenza B by PCR: NEGATIVE
Resp Syncytial Virus by PCR: NEGATIVE
SARS Coronavirus 2 by RT PCR: NEGATIVE

## 2023-09-17 MED ORDER — MAGNESIUM SULFATE 2 GM/50ML IV SOLN
2.0000 g | Freq: Once | INTRAVENOUS | Status: AC
  Administered 2023-09-17: 2 g via INTRAVENOUS
  Filled 2023-09-17: qty 50

## 2023-09-17 MED ORDER — ALBUMIN HUMAN 25 % IV SOLN
25.0000 g | Freq: Once | INTRAVENOUS | Status: AC
  Administered 2023-09-17: 25 g via INTRAVENOUS
  Filled 2023-09-17: qty 100

## 2023-09-17 MED ORDER — BENZONATATE 100 MG PO CAPS
100.0000 mg | ORAL_CAPSULE | Freq: Once | ORAL | Status: AC
  Administered 2023-09-17: 100 mg via ORAL
  Filled 2023-09-17: qty 1

## 2023-09-17 NOTE — Progress Notes (Addendum)
 Daily Progress Note   Patient Name: Leslie Parker       Date: 09/17/2023 DOB: 18-Jun-1933  Age: 89 y.o. MRN#: [id number] Attending Physician: Marrion Coy, MD Primary Care Physician: [name], MD Admit Date: 09/15/2023  Reason for Consultation/Follow-up: Establishing goals of care  Subjective: Notes and labs reviewed.  In to see patient and offer support. She is currently working with PT/OT.  She states she is hopeful to be able to discharge home tomorrow and returns to working with therapy.   Continue full code and full scope care at this time.  Patient would not want dialysis.  Patient was clear yesterday she no longer wanted to discuss goals of care, and that her granddaughter would be surrogate decision maker if he is no longer able to make decisions for herself.  Length of Stay: 2  Current Medications: Scheduled Meds:   amiodarone  200 mg Oral Daily   apixaban  2.5 mg Oral BID   carvedilol  6.25 mg Oral BID WC   colestipol  1 g Oral BID   empagliflozin  10 mg Oral Daily   feeding supplement  237 mL Oral BID BM   fluticasone furoate-vilanterol  1 puff Inhalation Daily   And   umeclidinium bromide  1 puff Inhalation Daily   hydrALAZINE  50 mg Oral TID   insulin aspart  0-6 Units Subcutaneous TID WC   isosorbide mononitrate  30 mg Oral QHS   multivitamin with minerals  1 tablet Oral Daily   pantoprazole  40 mg Oral Daily   traZODone  100 mg Oral QHS    Continuous Infusions:  furosemide (LASIX) 200 mg in dextrose 5 % 100 mL (2 mg/mL) infusion 4 mg/hr (09/16/23 1015)    PRN Meds: acetaminophen, albuterol, ondansetron (ZOFRAN) IV, ondansetron, mouth rinse, traMADol  Physical Exam Pulmonary:     Effort: Pulmonary effort is normal.  Neurological:     Mental Status:  She is alert.             Vital Signs: BP (!) 149/71 (BP Location: Right Arm)   Pulse 62   Temp 98 F (36.7 C) (Oral)   Resp 16   Ht 5\' 9"  (1.753 m)   Wt 78.2 kg   SpO2 94%   BMI 25.46 kg/m  SpO2: SpO2: 94 %  O2 Device: O2 Device: Room Air O2 Flow Rate: O2 Flow Rate (L/min): 2 L/min  Intake/output summary:  Intake/Output Summary (Last 24 hours) at 09/17/2023 1510 Last data filed at 09/17/2023 1354 Gross per 24 hour  Intake 320 ml  Output 1800 ml  Net -1480 ml   LBM:   Baseline Weight: Weight: 74.8 kg Most recent weight: Weight: 78.2 kg    Patient Active Problem List   Diagnosis Date Noted   Malnutrition of moderate degree 09/17/2023   CHF (congestive heart failure) (HCC) 09/15/2023   Acute on chronic systolic CHF (congestive heart failure) (HCC) 08/19/2023   Pleural effusion due to CHF (congestive heart failure) (HCC) 08/19/2023   HTN (hypertension) 08/19/2023   Ileus (HCC) 08/12/2023   Large bowel obstruction (HCC) 08/12/2023   CKD (chronic kidney disease) stage 4, GFR 15-29 ml/min (HCC) 08/12/2023   HFrEF (heart failure with reduced ejection fraction) (HCC) 08/12/2023   Acute on chronic combined systolic and diastolic CHF (congestive heart failure) (HCC) 08/10/2023   Hyponatremia 05/05/2023   Overweight (BMI 25.0-29.9) 05/01/2023   Left hip pain 05/01/2023   Hypokalemia 04/30/2023   Anemia 03/21/2023   Abnormal LFTs 03/20/2023   Type II diabetes mellitus with renal manifestations (HCC) 02/13/2023   Chronic diastolic CHF (congestive heart failure) (HCC) 02/13/2023   CKD stage 3 due to type 2 diabetes mellitus (HCC) 11/24/2022   Paroxysmal atrial fibrillation (HCC) 11/24/2022   Sick sinus syndrome (HCC) 11/15/2022   Acute on chronic diastolic CHF (congestive heart failure) (HCC) 08/27/2022   COPD (chronic obstructive pulmonary disease) (HCC) 08/23/2022   Chronic a-fib (HCC) 08/22/2022   Diabetes mellitus without complication (HCC)    Stage 3b chronic kidney  disease (HCC) - Baseline scr 1.8-2.0    Postural dizziness with presyncope    Hypomagnesemia    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  Patient would not want dialysis.  She was clear yesterday she did not want to discuss goals of care any further and that her granddaughter would be her surrogate decision maker if she is unable to speak for herself. PMT will sign off as goals are set.  Please reconsult if patient would like to speak with [place] regarding goals of care.  Code Status:    Code Status Orders  (From admission, onward)           Start     Ordered   09/15/23 0830  Full code  Continuous       Question:  By:  Answer:  Consent: discussion documented in EHR   09/15/23 0830           Code Status History     Date Active Date Inactive Code Status Order ID Comments User Context   08/19/2023 2258 08/23/2023 2148 Full Code [id number]  Lorretta Harp, MD ED   08/12/2023 0927 08/16/2023 2054 Full Code [id number]  Floydene Flock, MD ED   08/07/2023 1626 08/11/2023 1912 Full Code [id number]  Floydene Flock, MD ED   06/16/2023 1227 06/19/2023 1856 Full Code [id number]  Emeline General, MD ED   06/04/2023 1527 06/07/2023 1840 Full Code [id number]  Floydene Flock, MD ED   04/30/2023 1659 05/09/2023 2220 Full Code [id number]  Lucile Shutters, MD ED   03/21/2023 0012 04/01/2023 1618 Full  Code [id number]  Gertha Calkin, MD ED   02/13/2023 [phone] 02/15/2023 1825 Full Code [id number]  Lorretta Harp, MD ED   11/24/2022 2339 11/29/2022 1924 Full Code [id number]  Lucile Shutters, MD ED   11/15/2022 0928 11/16/2022 1952 Full Code [id number]  Marcina Millard, MD Inpatient   08/22/2022 2153 08/27/2022 1916 Full Code [id number]  Gertha Calkin, MD ED   06/05/2022 2039 06/07/2022 1712 Full Code [id number]   Andris Baumann, MD ED   09/03/2020 0433 09/05/2020 1806 Full Code [id number]  Mansy, Vernetta Honey, MD ED   12/02/2017 1152 12/04/2017 1731 Full Code [id number]  Lyndle Herrlich, MD Inpatient   01/25/2017 1402 01/28/2017 2028 Full Code [id number]  Milagros Loll, MD ED   01/17/2017 1444 01/18/2017 1754 Full Code [id number]  Juanell Fairly, MD Inpatient   02/04/2015 1534 02/10/2015 2036 Full Code [id number]  Juanell Fairly, MD Inpatient   02/03/2015 1751 02/04/2015 1534 Full Code [id number]  Juanell Fairly, MD Inpatient   02/03/2015 1749 02/03/2015 1750 Full Code [id number]  Juanell Fairly, MD Inpatient   02/03/2015 1338 02/03/2015 1749 Full Code [id number]  Milagros Loll, MD ED     Thank you for allowing the Palliative Medicine Team to assist in the care of this patient.     Morton Stall, NP  Please contact Palliative Medicine Team phone at [phone] for questions and concerns.

## 2023-09-17 NOTE — Progress Notes (Addendum)
 Progress Note   Patient: Leslie Parker OVZ:[id number] DOB: October 19, 1932 DOA: 09/15/2023     2 DOS: the patient was seen and examined on 09/17/2023   Brief hospital course: Leslie Parker is a 89 y.o. female with medical history significant of chronic HFrEF with LVEF 25-30%, SSS status post PPM, PAF on Eliquis, CKD stage IV, HTN, IIDM, presented with increasing shortness of breath and generalized weakness.  Chest x-ray showed evidence of significant volume overload.  Profound elevation BNP.  Patient was started on IV Lasix.   Active Problems:   Acute on chronic diastolic CHF (congestive heart failure) (HCC)   Acute combined systolic (congestive) and diastolic (congestive) heart failure (HCC) - LVEF 25%   Paroxysmal atrial fibrillation (HCC)   COPD (chronic obstructive pulmonary disease) (HCC)   Essential hypertension   CKD (chronic kidney disease) stage 4, GFR 15-29 ml/min (HCC)   Hypokalemia   Type II diabetes mellitus with renal manifestations (HCC)   Hyponatremia   CHF (congestive heart failure) (HCC)   Malnutrition of moderate degree   Assessment and Plan:  Acute on chronic combined systolic diastolic congestive heart failure with ejection fraction 25%-30%. Elevated troponin secondary to congestive heart failure exacerbation. Acute respite failure ruled out. Patient had a profound volume overload, this is secondary to acute exacerbation of systolic congestive heart failure. Patient was placed on 2 L oxygen, but there is no documentation of hypoxemia. Patient was given IV Lasix, due to stage IV chronic kidney disease, I changed to Lasix drip.  Continue to monitor renal function closely. PDMT is limited due to advanced renal disease.  Currently on Jardiance, and Coreg. Patient appeared to be doing better, renal function slightly worsened.  However, patient had very frequent admission due to congestive heart failure, I will try to diuresing her more.  I will give 25 g albumin and continue  IV Lasix.  Recheck renal function tomorrow.   Chronic kidney disease stage IV. Hyponatremia Hypokalemia. Hypomagnesemia. Renal function slightly worsened today, potassium normalized.  Give a dose of albumin.  Recheck a BMP tomorrow.  Also give 2 g of magnesium sulfate.   Paroxysmal atrial fibrillation. Continue Eliquis and amiodarone.   Type 2 diabetes. Start sliding scale insulin.   Anorexia. Likely secondary to congestive heart failure, continue to follow.   COPD  No evidence of bronchospasm.   Moderate protein calorie malnutrition. Moderate Malnutrition related to chronic illness (CHF) as evidenced by mild to moderate fat depletion, mild to moderate muscle depletion, percent weight loss, edema.    Subjective:  Patient seems to be in better today, short of breath improving.  Nonproductive cough.  Physical Exam: Vitals:   09/16/23 2338 09/17/23 0500 09/17/23 0520 09/17/23 1025  BP: 135/66  139/72 123/74  Pulse: (!) 59  62 60  Resp: 18  18   Temp: 97.7 F (36.5 C)  97.9 F (36.6 C) 97.9 F (36.6 C)  TempSrc: Oral  Oral Oral  SpO2: 94%  92% 95%  Weight:  78.2 kg    Height:       General exam: Appears calm and comfortable  Respiratory system: Decreased breath sounds. Respiratory effort normal. Cardiovascular system: S1 & S2 heard, RRR. No JVD, murmurs, rubs, gallops or clicks. No pedal edema. Gastrointestinal system: Abdomen is nondistended, soft and nontender. No organomegaly or masses felt. Normal bowel sounds heard. Central nervous system: Alert and oriented. No focal neurological deficits. Extremities: Symmetric 5 x 5 power. Skin: No rashes, lesions or ulcers Psychiatry: Judgement and insight  appear normal. Mood & affect appropriate.    Data Reviewed:  Results reviewed.  Family Communication: Granddaughter updated over the phone.  Disposition: Status is: Inpatient Remains inpatient appropriate because: Severity of disease, IV treatment.  CODE  STATUS. Discussed with the patient and her POA granddaughter, explained that the chance of survival from resuscitation is very low due to her advanced age and medical problems.  But they still want to be full code.     Time spent: 60 minutes  Author: Marrion Coy, MD 09/17/2023 10:47 AM  For on call review [email or web address].

## 2023-09-17 NOTE — Plan of Care (Signed)

## 2023-09-17 NOTE — Progress Notes (Signed)
 Occupational Therapy Treatment Patient Details Name: Leslie Parker MRN: [id number] DOB: 03/02/1933 Today's Date: 09/17/2023   History of present illness Pt is an 89 y.o. female with medical history significant of COPD, chronic HFrEF with LVEF 25-30%, SSS status post PPM, PAF on Eliquis, CKD stage IV, HTN, IIDM, and per patient multiple L hip surgeries who presented with increasing shortness of breath and generalized weakness. MD assessment includes: acute respiratory failure with hypoxia, acute on chronic CHF, cardiomyopathy, deconditioning, and unintentional weight loss.   OT comments  Pt making progress towards goals. OT session focused on ADL performance including mobility, UB/LB dressing and bathing with standing grooming tasks. Pt does require verbal cues and intermittent minA for awareness during bed mobility as she tends to lean backwards heavily, unaware of bed rails. STS transfers performed with CGA, cues for technique, ~15 ft mobility in room using RW with CGA for safety, and LB dressing/bathing performed (minA to doff soiled brief, able to thread underwear over feet seated, and requires minA to pull over hips in standing). Pt left seated in recliner, RN in room. Discharge recommendation appropriate, Patient will benefit from continued inpatient follow up therapy, <3 hours/day       If plan is discharge home, recommend the following:  A little help with walking and/or transfers;A little help with bathing/dressing/bathroom;Assistance with cooking/housework;Assist for transportation   Equipment Recommendations  None recommended by OT    Recommendations for Other Services      Precautions / Restrictions Precautions Precautions: Fall Restrictions Weight Bearing Restrictions Per Provider Order: No       Mobility Bed Mobility Overal bed mobility: Needs Assistance Bed Mobility: Supine to Sit     Supine to sit: Supervision     General bed mobility comments: increased time, needs  cuing for technique and to not hit head on bed rails    Transfers Overall transfer level: Needs assistance Equipment used: Rolling walker (2 wheels) Transfers: Sit to/from Stand Sit to Stand: Contact guard assist           General transfer comment: pt has a lift chair at home, required cues to control descent and for hand placement. able to perform with light CGA; increased trials to stand from recliner due to lower surface     Balance Overall balance assessment: Needs assistance Sitting-balance support: Feet supported, No upper extremity supported Sitting balance-[name]: Normal   Postural control: Posterior lean Standing balance support: During functional activity Standing balance-[name]: Good Standing balance comment: CGA for dynamic standing balance while performing ADLs with unilateral hand support                           ADL either performed or assessed with clinical judgement   ADL Overall ADL's : Needs assistance/impaired     Grooming: Brushing hair;Standing Grooming Details (indicate cue type and reason): CGA for standing balance     Lower Body Bathing: Sitting/lateral leans;Sit to/from stand;Maximal assistance Lower Body Bathing Details (indicate cue type and reason): maxA for anterior pericare in standing, pt performing anterior pericare seated using lateral leans (pt tends to lean backwards heavily and requires minA to correct to avoid hitting head on bed rails) Upper Body Dressing : Sitting;Minimal assistance Upper Body Dressing Details (indicate cue type and reason): minA to don gown Lower Body Dressing: Sit to/from stand;Maximal assistance;Minimal assistance Lower Body Dressing Details (indicate cue type and reason): maxA to doff soiled brief in standing, CGA for standing balance.  requires minA to thread underwear over feet, pt able to pull over hips with CGA for standing balance with unilateral UE support on RW              Functional mobility during ADLs: Contact guard assist;Rolling walker (2 wheels) General ADL Comments: Requires cues for STS transfer technique, cues to control ascent and descent from various level surfaces. Decreased insight into deficits and mild safety awareness issues     Communication Communication Communication: No apparent difficulties   Cognition Arousal: Alert Behavior During Therapy: Anxious Cognition: No family/caregiver present to determine baseline, Cognition impaired           Executive functioning impairment (select all impairments): Problem solving OT - Cognition Comments: tangetial, often perseverating on medical conditions not relevant to edu provided by OT, unaware of deficits                 Following commands: Intact        Cueing   Cueing Techniques: Verbal cues        General Comments noted small red spot on bottom; RN notified, educated pt on letting nursing staff know that brief is soiled as pt was unaware    Pertinent Vitals/ Pain       Pain Assessment Pain Assessment: No/denies pain Pain Score: 0-No pain         Frequency  Min 1X/week        Progress Toward Goals  OT Goals(current goals can now be found in the care plan section)  Progress towards OT goals: Progressing toward goals  Acute Rehab OT Goals OT Goal Formulation: With patient Time For Goal Achievement: 09/30/23 Potential to Achieve Goals: Good ADL Goals Pt Will Perform Grooming: with modified independence;standing Pt Will Perform Upper Body Bathing: with modified independence;sitting Pt Will Perform Lower Body Dressing: with modified independence;sit to/from stand;with adaptive equipment;sitting/lateral leans Pt Will Transfer to Toilet: with modified independence;ambulating;grab bars Pt Will Perform Toileting - Clothing Manipulation and hygiene: with modified independence;sitting/lateral leans;with adaptive equipment;sit to/from stand  Plan         AM-PAC OT  "6 Clicks" Daily Activity     Outcome Measure   Help from another person eating meals?: None Help from another person taking care of personal grooming?: A Little Help from another person toileting, which includes using toliet, bedpan, or urinal?: A Little Help from another person bathing (including washing, rinsing, drying)?: A Little Help from another person to put on and taking off regular upper body clothing?: A Little Help from another person to put on and taking off regular lower body clothing?: A Little 6 Click Score: 19    End of Session Equipment Utilized During Treatment: Gait belt;Rolling walker (2 wheels)  OT Visit Diagnosis: Muscle weakness (generalized) (M62.81);Unsteadiness on feet (R26.81)   Activity Tolerance Patient tolerated treatment well   Patient Left in chair;with chair alarm set;with call bell/phone within reach;with nursing/sitter in room   Nurse Communication Mobility status        Time: 4034-7425 OT Time Calculation (min): 31 min  Charges: OT General Charges $OT Visit: 1 Visit OT Treatments $Self Care/Home Management : 23-37 mins  Mija Effertz L. Chon Buhl, OTR/L  09/17/23, 2:16 PM

## 2023-09-17 NOTE — Consult Note (Signed)
 [facility] Liaison Consult Note    09/17/2023  Leslie Parker 1933-07-22 [id number]  Primary Care Provider:  [name], MD [facility])  Patient is currently active with Care Management for chronic disease management services.  Patient has been engaged by a Presenter, broadcasting.  Our community based plan of care has focused on disease management and community resource support.   Patient will receive a post hospital call and will be evaluated for assessments and disease process education.   Plan: Pending discharge disposition.  Inpatient Transition Of Care [TOC] team member to make aware that Care Management following.  Of note, Care Management services does not replace or interfere with any services that are needed or arranged by inpatient [facility] care management team.   For additional questions or referrals please contact:  Elliot Cousin, RN, [facility] Liaison [place]   [facility], Population Health Office Hours MTWF  8:00 am-6:00 pm Direct Dial: [phone] mobile [phone] [Office toll free line] Office Hours are M-F 8:30 - 5 pm [email or web address]

## 2023-09-18 DIAGNOSIS — I5043 Acute on chronic combined systolic (congestive) and diastolic (congestive) heart failure: Secondary | ICD-10-CM | POA: Diagnosis not present

## 2023-09-18 LAB — GLUCOSE, CAPILLARY
Glucose-Capillary: 136 mg/dL — ABNORMAL HIGH (ref 70–99)
Glucose-Capillary: 145 mg/dL — ABNORMAL HIGH (ref 70–99)
Glucose-Capillary: 165 mg/dL — ABNORMAL HIGH (ref 70–99)
Glucose-Capillary: 219 mg/dL — ABNORMAL HIGH (ref 70–99)

## 2023-09-18 LAB — BASIC METABOLIC PANEL
Anion gap: 12 (ref 5–15)
BUN: 55 mg/dL — ABNORMAL HIGH (ref 8–23)
CO2: 24 mmol/L (ref 22–32)
Calcium: 8.3 mg/dL — ABNORMAL LOW (ref 8.9–10.3)
Chloride: 98 mmol/L (ref 98–111)
Creatinine, Ser: 2.57 mg/dL — ABNORMAL HIGH (ref 0.44–1.00)
GFR, Estimated: 17 mL/min — ABNORMAL LOW (ref >60)
Glucose, Bld: 138 mg/dL — ABNORMAL HIGH (ref 70–99)
Potassium: 3.5 mmol/L (ref 3.5–5.1)
Sodium: 134 mmol/L — ABNORMAL LOW (ref 135–145)

## 2023-09-18 LAB — MAGNESIUM: Magnesium: 2 mg/dL (ref 1.7–2.4)

## 2023-09-18 NOTE — Care Management Important Message (Signed)
 Important Message  Patient Details  Name: Leslie Parker MRN: [id number] Date of Birth: [birth year]   Important Message Given:  Yes - Medicare IM     Cristela Blue, CMA 09/18/2023, 10:33 AM

## 2023-09-18 NOTE — Plan of Care (Signed)

## 2023-09-18 NOTE — Progress Notes (Signed)
 Physical Therapy Treatment Patient Details Name: Leslie Parker MRN: [id number] DOB: 04-19-1933 Today's Date: 09/18/2023   History of Present Illness Pt is an 89 y.o. female with medical history significant of COPD, chronic HFrEF with LVEF 25-30%, SSS status post PPM, PAF on Eliquis, CKD stage IV, HTN, IIDM, and per patient multiple L hip surgeries who presented with increasing shortness of breath and generalized weakness. MD assessment includes: acute respiratory failure with hypoxia, acute on chronic CHF, cardiomyopathy, deconditioning, and unintentional weight loss.    PT Comments  Patient is agreeable to PT session. She wants to walk. She walked a short distance with rolling walker with activity tolerance limited by fatigue. CGA provided for safety. Occasional safety cues with mobility. Recommend to continue PT to maximize independence and facilitate return to prior level of function. She is hopeful for discharge home soon.    If plan is discharge home, recommend the following: A little help with walking and/or transfers;A little help with bathing/dressing/bathroom;Assistance with cooking/housework;Assist for transportation   Can travel by private vehicle        Equipment Recommendations  None recommended by PT    Recommendations for Other Services       Precautions / Restrictions Precautions Precautions: Fall Restrictions Weight Bearing Restrictions Per Provider Order: No     Mobility  Bed Mobility Overal bed mobility: Needs Assistance Bed Mobility: Sit to Supine, Supine to Sit     Supine to sit: Supervision Sit to supine: Min assist   General bed mobility comments: assistance for LE to return to bed.    Transfers Overall transfer level: Needs assistance Equipment used: Rolling walker (2 wheels) Transfers: Sit to/from Stand Sit to Stand: Contact guard assist           General transfer comment: cues for safety    Ambulation/Gait Ambulation/Gait assistance:  Contact guard assist Gait Distance (Feet): 30 Feet Assistive device: Rolling walker (2 wheels) Gait Pattern/deviations: Step-through pattern, Decreased stride length Gait velocity: decreased     General Gait Details: slow but steady with 2 wheeled walker. she reports using a 4 wheeled walker at home and is interested in trying this next session. activity tolerance limited by fatigue. occasional cues for safety   Stairs             Wheelchair Mobility     Tilt Bed    Modified Rankin (Stroke Patients Only)       Balance Overall balance assessment: Needs assistance Sitting-balance support: Feet supported, No upper extremity supported Sitting balance-[name]: Normal     Standing balance support: During functional activity Standing balance-[name]: Fair                              Hotel manager: No apparent difficulties  Cognition Arousal: Alert Behavior During Therapy: WFL for tasks assessed/performed   PT - Cognitive impairments: No apparent impairments                         Following commands: Intact      Cueing Cueing Techniques: Verbal cues  Exercises      General Comments        Pertinent Vitals/Pain Pain Assessment Pain Assessment: No/denies pain    Home Living                          Prior Function  PT Goals (current goals can now be found in the care plan section) Acute Rehab PT Goals Patient Stated Goal: to catch up on sleep and to go home PT Goal Formulation: With patient Time For Goal Achievement: 09/28/23 Potential to Achieve Goals: Good Progress towards PT goals: Progressing toward goals    Frequency    Min 1X/week      PT Plan      Co-evaluation              AM-PAC PT "6 Clicks" Mobility   Outcome Measure  Help needed turning from your back to your side while in a flat bed without using bedrails?: A Little Help needed moving  from lying on your back to sitting on the side of a flat bed without using bedrails?: A Little Help needed moving to and from a bed to a chair (including a wheelchair)?: A Little Help needed standing up from a chair using your arms (e.g., wheelchair or bedside chair)?: A Little Help needed to walk in hospital room?: A Little Help needed climbing 3-5 steps with a railing? : A Lot 6 Click Score: 17    End of Session   Activity Tolerance: Patient limited by fatigue;Patient tolerated treatment well Patient left: in bed;with call bell/phone within reach;with bed alarm set   PT Visit Diagnosis: Difficulty in walking, not elsewhere classified (R26.2);Muscle weakness (generalized) (M62.81);Pain     Time: 1415-1431 PT Time Calculation (min) (ACUTE ONLY): 16 min  Charges:    $Therapeutic Activity: 8-22 mins PT General Charges $$ ACUTE PT VISIT: 1 Visit                     Donna Bernard, PT, MPT    Ina Homes 09/18/2023, 3:38 PM

## 2023-09-18 NOTE — Progress Notes (Signed)
 Progress Note    ETHELINE GEPPERT  NFA:[id number] DOB: 06-22-1933  DOA: 09/15/2023 PCP: [name], MD      Brief Narrative:    Medical records reviewed and are as summarized below:   Leslie Parker is a 89 y.o. female with medical history significant of chronic HFrEF with LVEF 25-30%, SSS status post PPM, PAF on Eliquis, CKD stage IV, HTN, IIDM, presented with increasing shortness of breath and generalized weakness.  Chest x-ray showed evidence of significant volume overload.  Profound elevation BNP.  Patient was started on IV Lasix.      Assessment/Plan:   Active Problems:   Acute on chronic diastolic CHF (congestive heart failure) (HCC)   Acute on chronic combined systolic and diastolic CHF (congestive heart failure) (HCC)   Paroxysmal atrial fibrillation (HCC)   COPD (chronic obstructive pulmonary disease) (HCC)   Essential hypertension   CKD (chronic kidney disease) stage 4, GFR 15-29 ml/min (HCC)   Hypokalemia   Type II diabetes mellitus with renal manifestations (HCC)   Hyponatremia   CHF (congestive heart failure) (HCC)   Malnutrition of moderate degree   Nutrition Problem: Moderate Malnutrition Etiology: chronic illness (CHF)  Signs/Symptoms: mild fat depletion, moderate fat depletion, mild muscle depletion, moderate muscle depletion, percent weight loss, edema   Body mass index is 24.51 kg/m.    Acute on chronic combined systolic diastolic congestive heart failure with ejection fraction 25%-30%. Elevated troponin secondary to congestive heart failure exacerbation. Discontinue IV Lasix drip because of increasing creatinine Continue carvedilol, hydralazine and isosorbide mononitrate GDMT is limited because of CKD    AKI on chronic kidney disease stage IV. S/p IV albumin.  IV Lasix has been held.  Repeat BMP tomorrow.   Hyponatremia Fluctuating sodium levels.  This is likely from CHF Hypokalemia and hypomagnesemia. Improved     Paroxysmal atrial fibrillation. Continue Eliquis and amiodarone.   Type 2 diabetes. Continue NovoLog sliding scale.  Jardiance on hold    Anorexia. Encourage adequate oral intake    COPD  Stable.  Continue bronchodilators as needed    Moderate protein calorie malnutrition. Moderate Malnutrition related to chronic illness (CHF) as evidenced by mild to moderate fat depletion, mild to moderate muscle depletion, percent weight loss, edema.      Patient has been admitted to the hospital multiple times and she is at high risk for readmission because of multiple comorbidities and advanced age.  Appreciate input from palliative care team.           Diet Order             Diet 2 gram sodium Fluid consistency: Thin  Diet effective now                            Consultants: None  Procedures: None    Medications:    amiodarone  200 mg Oral Daily   apixaban  2.5 mg Oral BID   carvedilol  6.25 mg Oral BID WC   colestipol  1 g Oral BID   feeding supplement  237 mL Oral BID BM   fluticasone furoate-vilanterol  1 puff Inhalation Daily   And   umeclidinium bromide  1 puff Inhalation Daily   hydrALAZINE  50 mg Oral TID   insulin aspart  0-6 Units Subcutaneous TID WC   isosorbide mononitrate  30 mg Oral QHS   multivitamin with minerals  1 tablet Oral Daily   pantoprazole  40 mg Oral Daily   traZODone  100 mg Oral QHS   Continuous Infusions:   Anti-infectives (From admission, onward)    None              Family Communication/Anticipated D/C date and plan/Code Status   DVT prophylaxis: apixaban (ELIQUIS) tablet 2.5 mg Start: 09/15/23 1130 apixaban (ELIQUIS) tablet 2.5 mg     Code Status: Full Code  Family Communication: None Disposition Plan: Plan to discharge home   Status is: Inpatient Remains inpatient appropriate because: AKI, CHF exacerbation       Subjective:   Interval events noted.  No shortness of breath or chest  pain.  She is producing adequate urine.  She feels better.  Objective:    Vitals:   09/18/23 0500 09/18/23 0821 09/18/23 1206 09/18/23 1509  BP:  (!) 175/81 (!) 155/70 135/68  Pulse:  67 62 62  Resp:  18 18 18   Temp:  98 F (36.7 C) (!) 97.5 F (36.4 C) (!) 97.5 F (36.4 C)  TempSrc:      SpO2:  94% 95% 95%  Weight: 75.3 kg     Height:       No data found.   Intake/Output Summary (Last 24 hours) at 09/18/2023 1521 Last data filed at 09/18/2023 1511 Gross per 24 hour  Intake 323.23 ml  Output 2600 ml  Net -2276.77 ml   Filed Weights   09/16/23 0500 09/17/23 0500 09/18/23 0500  Weight: 78 kg 78.2 kg 75.3 kg    Exam:  GEN: NAD SKIN: Warm and dry EYES: No pallor or icterus ENT: MMM CV: RRR PULM: CTA B ABD: soft, ND, NT, +BS CNS: AAO x 3, non focal EXT: No edema or tenderness       Data Reviewed:   I have personally reviewed following labs and imaging studies:  Labs: Labs show the following:   Basic Metabolic Panel: Recent Labs  Lab 09/15/23 0646 09/16/23 0635 09/17/23 0525 09/18/23 0456  NA 128* 134* 133* 134*  K 4.0 3.4* 3.9 3.5  CL 94* 100 100 98  CO2 20* 24 24 24   GLUCOSE 180* 157* 148* 138*  BUN 47* 49* 56* 55*  CREATININE 2.43* 2.38* 2.52* 2.57*  CALCIUM 8.5* 8.2* 8.1* 8.3*  MG  --   --  1.6* 2.0   GFR Estimated Creatinine Clearance: 15.8 mL/min (A) (by C-G formula based on SCr of 2.57 mg/dL (H)). Liver Function Tests: No results for input(s): "AST", "ALT", "ALKPHOS", "BILITOT", "PROT", "ALBUMIN" in the last 168 hours. No results for input(s): "LIPASE", "AMYLASE" in the last 168 hours. No results for input(s): "AMMONIA" in the last 168 hours. Coagulation profile No results for input(s): "INR", "PROTIME" in the last 168 hours.  CBC: Recent Labs  Lab 09/15/23 0646  WBC 5.8  NEUTROABS 4.1  HGB 12.4  HCT 36.3  MCV 89.6  PLT 294   Cardiac Enzymes: No results for input(s): "CKTOTAL", "CKMB", "CKMBINDEX", "TROPONINI" in the last  168 hours. BNP (last 3 results) No results for input(s): "PROBNP" in the last 8760 hours. CBG: Recent Labs  Lab 09/17/23 1159 09/17/23 1645 09/17/23 2109 09/18/23 0821 09/18/23 1207  GLUCAP 132* 123* 159* 136* 145*   D-Dimer: No results for input(s): "DDIMER" in the last 72 hours. Hgb A1c: No results for input(s): "HGBA1C" in the last 72 hours. Lipid Profile: No results for input(s): "CHOL", "HDL", "LDLCALC", "TRIG", "CHOLHDL", "LDLDIRECT" in the last 72 hours. Thyroid function studies: No results for input(s): "TSH", "T4TOTAL", "  T3FREE", "THYROIDAB" in the last 72 hours.  Invalid input(s): "FREET3" Anemia work up: No results for input(s): "VITAMINB12", "FOLATE", "FERRITIN", "TIBC", "IRON", "RETICCTPCT" in the last 72 hours. Sepsis Labs: Recent Labs  Lab 09/15/23 0646  WBC 5.8    Microbiology Recent Results (from the past 240 hours)  Resp panel by RT-PCR (RSV, Flu A&B, Covid) Anterior Nasal Swab     Status: None   Collection Time: 09/16/23  9:26 PM   Specimen: Anterior Nasal Swab  Result Value Ref Range Status   SARS Coronavirus 2 by RT PCR NEGATIVE NEGATIVE Final    Comment: (NOTE) SARS-CoV-2 target nucleic acids are NOT DETECTED.  The SARS-CoV-2 RNA is generally detectable in upper respiratory specimens during the acute phase of infection. The lowest concentration of SARS-CoV-2 viral copies this assay can detect is 138 copies/mL. A negative result does not preclude SARS-Cov-2 infection and should not be used as the sole basis for treatment or other patient management decisions. A negative result may occur with  improper specimen collection/handling, submission of specimen other than nasopharyngeal swab, presence of viral mutation(s) within the areas targeted by this assay, and inadequate number of viral copies(<138 copies/mL). A negative result must be combined with clinical observations, patient history, and epidemiological information. The expected result is  Negative.  Fact Sheet for Patients:  BloggerCourse.com  Fact Sheet for Healthcare Providers:  SeriousBroker.it  This test is no t yet approved or cleared by the [place] FDA and  has been authorized for detection and/or diagnosis of SARS-CoV-2 by FDA under an Emergency Use Authorization (EUA). This EUA will remain  in effect (meaning this test can be used) for the duration of the COVID-19 declaration under Section 564(b)(1) of the Act, 21 U.S.C.section 360bbb-3(b)(1), unless the authorization is terminated  or revoked sooner.       Influenza A by PCR NEGATIVE NEGATIVE Final   Influenza B by PCR NEGATIVE NEGATIVE Final    Comment: (NOTE) The Xpert Xpress SARS-CoV-2/FLU/RSV plus assay is intended as an aid in the diagnosis of influenza from Nasopharyngeal swab specimens and should not be used as a sole basis for treatment. Nasal washings and aspirates are unacceptable for Xpert Xpress SARS-CoV-2/FLU/RSV testing.  Fact Sheet for Patients: BloggerCourse.com  Fact Sheet for Healthcare Providers: SeriousBroker.it  This test is not yet approved or cleared by the [place] FDA and has been authorized for detection and/or diagnosis of SARS-CoV-2 by FDA under an Emergency Use Authorization (EUA). This EUA will remain in effect (meaning this test can be used) for the duration of the COVID-19 declaration under Section 564(b)(1) of the Act, 21 U.S.C. section 360bbb-3(b)(1), unless the authorization is terminated or revoked.     Resp Syncytial Virus by PCR NEGATIVE NEGATIVE Final    Comment: (NOTE) Fact Sheet for Patients: BloggerCourse.com  Fact Sheet for Healthcare Providers: SeriousBroker.it  This test is not yet approved or cleared by the [place] FDA and has been authorized for detection and/or diagnosis of  SARS-CoV-2 by FDA under an Emergency Use Authorization (EUA). This EUA will remain in effect (meaning this test can be used) for the duration of the COVID-19 declaration under Section 564(b)(1) of the Act, 21 U.S.C. section 360bbb-3(b)(1), unless the authorization is terminated or revoked.  Performed at [facility], [address] [place], [place] [address]     Procedures and diagnostic studies:  No results found.             LOS: 3 days   Nyiesha Beever  Devine Dant  [employer] Chartered loss adjuster on [email or web address]. If 7PM-7AM, please contact night-coverage at [email or web address]     09/18/2023, 3:21 PM

## 2023-09-19 DIAGNOSIS — I5043 Acute on chronic combined systolic (congestive) and diastolic (congestive) heart failure: Secondary | ICD-10-CM | POA: Diagnosis not present

## 2023-09-19 LAB — BASIC METABOLIC PANEL
Anion gap: 10 (ref 5–15)
BUN: 60 mg/dL — ABNORMAL HIGH (ref 8–23)
CO2: 25 mmol/L (ref 22–32)
Calcium: 7.9 mg/dL — ABNORMAL LOW (ref 8.9–10.3)
Chloride: 96 mmol/L — ABNORMAL LOW (ref 98–111)
Creatinine, Ser: 2.58 mg/dL — ABNORMAL HIGH (ref 0.44–1.00)
GFR, Estimated: 17 mL/min — ABNORMAL LOW (ref >60)
Glucose, Bld: 164 mg/dL — ABNORMAL HIGH (ref 70–99)
Potassium: 3.3 mmol/L — ABNORMAL LOW (ref 3.5–5.1)
Sodium: 131 mmol/L — ABNORMAL LOW (ref 135–145)

## 2023-09-19 LAB — GLUCOSE, CAPILLARY
Glucose-Capillary: 146 mg/dL — ABNORMAL HIGH (ref 70–99)
Glucose-Capillary: 148 mg/dL — ABNORMAL HIGH (ref 70–99)

## 2023-09-19 MED ORDER — POTASSIUM CHLORIDE CRYS ER 20 MEQ PO TBCR
40.0000 meq | EXTENDED_RELEASE_TABLET | Freq: Once | ORAL | Status: AC
  Administered 2023-09-19: 40 meq via ORAL
  Filled 2023-09-19: qty 2

## 2023-09-19 MED ORDER — ACETAMINOPHEN 325 MG PO TABS: 650.0000 mg | ORAL_TABLET | ORAL | Status: DC | PRN

## 2023-09-19 NOTE — Discharge Summary (Addendum)
 Physician Discharge Summary   Patient: Leslie Parker MRN: [id number] DOB: 1933/03/08  Admit date:     09/15/2023  Discharge date: 09/19/23  Discharge Physician: Lurene Shadow   PCP: [name], MD   Recommendations at discharge:   Follow-up with PCP in 1 week  Discharge Diagnoses: Active Problems:   Acute on chronic diastolic CHF (congestive heart failure) (HCC)   Acute on chronic combined systolic and diastolic CHF (congestive heart failure) (HCC)   Paroxysmal atrial fibrillation (HCC)   COPD (chronic obstructive pulmonary disease) (HCC)   Essential hypertension   CKD (chronic kidney disease) stage 4, GFR 15-29 ml/min (HCC)   Hypokalemia   Type II diabetes mellitus with renal manifestations (HCC)   Hyponatremia   CHF (congestive heart failure) (HCC)   Malnutrition of moderate degree  Resolved Problems:   * No resolved hospital problems. *  Hospital Course:  Leslie Parker is a 89 y.o. female with medical history significant of chronic HFrEF with LVEF 25-30%, SSS status post PPM, PAF on Eliquis, CKD stage IV, HTN, IIDM, presented with increasing shortness of breath and generalized weakness.  Chest x-ray showed evidence of significant volume overload.  Profound elevation BNP.  Patient was started on IV Lasix.    Assessment and Plan:  Acute on chronic combined systolic diastolic congestive heart failure with ejection fraction 25%-30%. Elevated troponin secondary to congestive heart failure exacerbation. Improved. S/p treatment with IV Lasix. Continue carvedilol, hydralazine and isosorbide mononitrate GDMT is limited because of CKD I called [facility] in [place], [place], to clarify prescriptions for carvedilol and metoprolol.  Pharmacist said that carvedilol is the most recent prescription from 08/11/2023.  Metoprolol prescription was from October 2024.  Metoprolol on home med list has been discontinued.     AKI on chronic kidney disease stage IV. S/p IV  albumin. Outpatient follow-up with PCP to monitor kidney function     Hyponatremia Fluctuating sodium levels.  This is likely from CHF Hypokalemia and hypomagnesemia. Replete potassium prior to discharge Continue magnesium supplement at discharge     Paroxysmal atrial fibrillation. Continue Eliquis, carvedilol and amiodarone.    Type 2 diabetes. [place] Pepper has been held because of increasing creatinine     Anorexia. Encourage adequate oral intake     COPD  Stable.  Continue bronchodilators as needed     Moderate protein calorie malnutrition. Moderate Malnutrition related to chronic illness (CHF) as evidenced by mild to moderate fat depletion, mild to moderate muscle depletion, percent weight loss, edema.      Patient has been admitted to the hospital multiple times and she is at high risk for readmission because of multiple comorbidities and advanced age.  Appreciate input from palliative care team.   Her condition has improved.  She feels better and wants to go home today.  She says she is able to get around with her walker.  She said she takes Lasix only as needed for swelling at home.  She weighs herself daily at home.  She is deemed stable for discharge to home today.            Consultants: Palliative care team Procedures performed: None Disposition: Home health Diet recommendation:  Discharge Diet Orders (From admission, onward)     Start     Ordered   09/19/23 0000  Diet - low sodium heart healthy        09/19/23 1310           Cardiac and Carb modified diet DISCHARGE  MEDICATION: Allergies as of 09/19/2023       Reactions   Baclofen Other (See Comments)   Elevated Cr   Simvastatin Other (See Comments)   Pt denies Elevated LFTs with simva 40mg , stopped and resolved (see MD note 11/10/13)        Medication List     STOP taking these medications    empagliflozin 10 MG Tabs tablet Commonly known as: JARDIANCE   metoprolol tartrate 50 MG  tablet Commonly known as: LOPRESSOR       TAKE these medications    acetaminophen 325 MG tablet Commonly known as: TYLENOL Take 2 tablets (650 mg total) by mouth every 4 (four) hours as needed for headache or mild pain (pain score 1-3).   albuterol 108 (90 Base) MCG/ACT inhaler Commonly known as: VENTOLIN HFA Inhale 2 puffs into the lungs every 6 (six) hours as needed.   amiodarone 200 MG tablet Commonly known as: PACERONE Take 1 tablet by mouth daily.   apixaban 2.5 MG Tabs tablet Commonly known as: ELIQUIS Take 1 tablet (2.5 mg total) by mouth 2 (two) times daily.   ascorbic acid 250 MG Chew Commonly known as: VITAMIN C Chew 250 mg by mouth daily.   B-12 2500 MCG Tabs Take 2,500 mcg by mouth daily.   carvedilol 6.25 MG tablet Commonly known as: COREG Take 1 tablet (6.25 mg total) by mouth 2 (two) times daily with a meal.   colestipol 1 g tablet Commonly known as: COLESTID Take 1 tablet by mouth 2 (two) times daily.   furosemide 20 MG tablet Commonly known as: LASIX Take 20 mg by mouth daily as needed. What changed: Another medication with the same name was removed. Continue taking this medication, and follow the directions you see here.   hydrALAZINE 50 MG tablet Commonly known as: APRESOLINE Take 1 tablet (50 mg total) by mouth 3 (three) times daily.   ipratropium-albuterol 0.5-2.5 (3) MG/3ML Soln Commonly known as: DUONEB Inhale 3 mLs into the lungs every 6 (six) hours as needed.   isosorbide mononitrate 30 MG 24 hr tablet Commonly known as: IMDUR Take 1 tablet (30 mg total) by mouth at bedtime.   Magnesium Oxide -Mg Supplement 500 MG Caps Take 500 mg by mouth daily.   multivitamin with minerals Tabs tablet Take 1 tablet by mouth daily. One-A-Day Women's Vitamin   omeprazole 20 MG capsule Commonly known as: PRILOSEC Take 20 mg by mouth every morning.   traZODone 100 MG tablet Commonly known as: DESYREL Take 100 mg by mouth at bedtime.    Trelegy Ellipta 100-62.5-25 MCG/ACT Aepb Generic drug: Fluticasone-Umeclidin-Vilant Inhale 1 puff into the lungs daily.   Vitamin D-1000 Max St 25 MCG (1000 UT) tablet Generic drug: Cholecalciferol Take 1,000 Units by mouth daily.        Discharge Exam: Filed Weights   09/17/23 0500 09/18/23 0500 09/19/23 0507  Weight: 78.2 kg 75.3 kg 72.5 kg   GEN: NAD SKIN: Warm and dry EYES: No pallor or icterus ENT: MMM CV: RRR PULM: No rales or wheezing ABD: soft, ND, NT, +BS CNS: AAO x 3, non focal EXT: No edema or tenderness   Condition at discharge: good  The results of significant diagnostics from this hospitalization (including imaging, microbiology, ancillary and laboratory) are listed below for reference.   Imaging Studies: DG Chest Portable 1 View Result Date: 09/15/2023 CLINICAL DATA:  Shortness of breath and weakness. EXAM: PORTABLE CHEST 1 VIEW COMPARISON:  Portable chest 08/10/2023. FINDINGS: Left chest dual  lead pacing system with stable wire insertions. Stable cardiomegaly. There is central vascular prominence, interstitial edema in the lower lung fields and small-to-moderate layering pleural effusions. Findings are compatible with CHF or fluid overload and are similar to the prior study. There are opacities in both lower lung fields which could indicate atelectasis, airspace edema or pneumonia. The mid and upper lung fields remain clear with COPD change. Stable mediastinum.  The aorta is tortuous and calcified. Right shoulder reverse arthroplasty with no new osseous findings. IMPRESSION: 1. Findings compatible with CHF or fluid overload, similar to the prior study. 2. Opacities in both lower lung fields which could indicate atelectasis, airspace edema or pneumonia. 3. Progress chest films recommended depending on clinical response. 4. COPD. 5. Aortic atherosclerosis. Electronically Signed   By: Almira Bar M.D.   On: 09/15/2023 07:39    Microbiology: Results for orders  placed or performed during the hospital encounter of 09/15/23  Resp panel by RT-PCR (RSV, Flu A&B, Covid) Anterior Nasal Swab     Status: None   Collection Time: 09/16/23  9:26 PM   Specimen: Anterior Nasal Swab  Result Value Ref Range Status   SARS Coronavirus 2 by RT PCR NEGATIVE NEGATIVE Final    Comment: (NOTE) SARS-CoV-2 target nucleic acids are NOT DETECTED.  The SARS-CoV-2 RNA is generally detectable in upper respiratory specimens during the acute phase of infection. The lowest concentration of SARS-CoV-2 viral copies this assay can detect is 138 copies/mL. A negative result does not preclude SARS-Cov-2 infection and should not be used as the sole basis for treatment or other patient management decisions. A negative result may occur with  improper specimen collection/handling, submission of specimen other than nasopharyngeal swab, presence of viral mutation(s) within the areas targeted by this assay, and inadequate number of viral copies(<138 copies/mL). A negative result must be combined with clinical observations, patient history, and epidemiological information. The expected result is Negative.  Fact Sheet for Patients:  BloggerCourse.com  Fact Sheet for Healthcare Providers:  SeriousBroker.it  This test is no t yet approved or cleared by the [place] FDA and  has been authorized for detection and/or diagnosis of SARS-CoV-2 by FDA under an Emergency Use Authorization (EUA). This EUA will remain  in effect (meaning this test can be used) for the duration of the COVID-19 declaration under Section 564(b)(1) of the Act, 21 U.S.C.section 360bbb-3(b)(1), unless the authorization is terminated  or revoked sooner.       Influenza A by PCR NEGATIVE NEGATIVE Final   Influenza B by PCR NEGATIVE NEGATIVE Final    Comment: (NOTE) The Xpert Xpress SARS-CoV-2/FLU/RSV plus assay is intended as an aid in the diagnosis of  influenza from Nasopharyngeal swab specimens and should not be used as a sole basis for treatment. Nasal washings and aspirates are unacceptable for Xpert Xpress SARS-CoV-2/FLU/RSV testing.  Fact Sheet for Patients: BloggerCourse.com  Fact Sheet for Healthcare Providers: SeriousBroker.it  This test is not yet approved or cleared by the [place] FDA and has been authorized for detection and/or diagnosis of SARS-CoV-2 by FDA under an Emergency Use Authorization (EUA). This EUA will remain in effect (meaning this test can be used) for the duration of the COVID-19 declaration under Section 564(b)(1) of the Act, 21 U.S.C. section 360bbb-3(b)(1), unless the authorization is terminated or revoked.     Resp Syncytial Virus by PCR NEGATIVE NEGATIVE Final    Comment: (NOTE) Fact Sheet for Patients: BloggerCourse.com  Fact Sheet for Healthcare Providers: SeriousBroker.it  This test  is not yet approved or cleared by the [place] and has been authorized for detection and/or diagnosis of SARS-CoV-2 by FDA under an Emergency Use Authorization (EUA). This EUA will remain in effect (meaning this test can be used) for the duration of the COVID-19 declaration under Section 564(b)(1) of the Act, 21 U.S.C. section 360bbb-3(b)(1), unless the authorization is terminated or revoked.  Performed at [facility], [address] [place], [place] [address]     Labs: CBC: Recent Labs  Lab 09/15/23 0646  WBC 5.8  NEUTROABS 4.1  HGB 12.4  HCT 36.3  MCV 89.6  PLT 294   Basic Metabolic Panel: Recent Labs  Lab 09/15/23 0646 09/16/23 0635 09/17/23 0525 09/18/23 0456 09/19/23 1051  NA 128* 134* 133* 134* 131*  K 4.0 3.4* 3.9 3.5 3.3*  CL 94* 100 100 98 96*  CO2 20* 24 24 24 25   GLUCOSE 180* 157* 148* 138* 164*  BUN 47* 49* 56* 55* 60*  CREATININE 2.43* 2.38* 2.52*  2.57* 2.58*  CALCIUM 8.5* 8.2* 8.1* 8.3* 7.9*  MG  --   --  1.6* 2.0  --    Liver Function Tests: No results for input(s): "AST", "ALT", "ALKPHOS", "BILITOT", "PROT", "ALBUMIN" in the last 168 hours. CBG: Recent Labs  Lab 09/18/23 1207 09/18/23 1719 09/18/23 2213 09/19/23 0759 09/19/23 1136  GLUCAP 145* 165* 219* 146* 148*    Discharge time spent: greater than 30 minutes.  Signed: Lurene Shadow, MD [facility] 09/19/2023

## 2023-09-19 NOTE — TOC Transition Note (Signed)
 Transition of Care [facility]) - Discharge Note   Patient Details  Name: Leslie Parker MRN: [id number] Date of Birth: June 04, 1933  [facility] CM/SW Contact:  Truddie Hidden, RN Phone Number: 09/19/2023, 2:06 PM   Clinical Narrative:    Spoke with patient about discharge plans for today. She will call her granddaughter to pick her up. Patient refused HH. She stated she being therapy with a orthopedic clinic. If she feels she needs HH she will follow up with her provider.   TOC signing off.         Barriers to Discharge: Continued Medical Work up   Patient Goals and CMS Choice            Discharge Placement                       Discharge Plan and Services Additional resources added to the After Visit Summary for       Post Acute Care Choice: NA                               Social Drivers of Health (SDOH) Interventions SDOH Screenings   Food Insecurity: No Food Insecurity (09/15/2023)  Housing: Low Risk  (09/15/2023)  Transportation Needs: No Transportation Needs (09/15/2023)  Utilities: Not At Risk (09/15/2023)  Alcohol Screen: Low Risk  (08/20/2023)  Financial Resource Strain: Low Risk  (08/20/2023)  Social Connections: Moderately Integrated (09/15/2023)  Tobacco Use: Low Risk  (09/15/2023)     Readmission Risk Interventions    09/16/2023    2:52 PM 08/20/2023    3:45 PM 08/16/2023   12:42 PM  Readmission Risk Prevention Plan  Transportation Screening Complete Complete Complete  Medication Review Oceanographer) Complete Complete Complete  PCP or Specialist appointment within 3-5 days of discharge Complete Complete   HRI or Home Care Consult Patient refused Complete Complete  SW Recovery Care/Counseling Consult Complete Complete Complete  Palliative Care Screening Not Applicable Not Applicable Not Applicable  Skilled Nursing Facility Not Applicable Not Applicable Not Applicable

## 2023-09-24 ENCOUNTER — Telehealth: Payer: Self-pay | Admitting: *Deleted

## 2023-09-24 NOTE — Patient Outreach (Signed)
 Care Coordination   09/24/2023 Name: Leslie Parker MRN: [id number] DOB: 08-08-1932   Care Coordination Outreach Attempts:  An unsuccessful telephone outreach was attempted today to offer the patient information about available complex care management services.  Follow Up Plan:  Additional outreach attempts will be made to offer the patient complex care management information and services.   Encounter Outcome:  No Answer   Care Coordination Interventions:  No, not indicated    Rodney Langton, RN, MSN, CCM [place]  [facility], [facility] RN Care Coordinator Direct Dial: [phone] / Main [phone] Fax [phone] Email: [email or web address]: [email or web address]

## 2023-10-03 DIAGNOSIS — M6281 Muscle weakness (generalized): Secondary | ICD-10-CM | POA: Diagnosis not present

## 2023-10-03 DIAGNOSIS — M48062 Spinal stenosis, lumbar region with neurogenic claudication: Secondary | ICD-10-CM | POA: Diagnosis not present

## 2023-10-04 ENCOUNTER — Ambulatory Visit: Payer: Self-pay | Admitting: *Deleted

## 2023-10-04 NOTE — Patient Outreach (Signed)
 Care Coordination   10/04/2023 Name: [name] MRN: [id number] DOB: February 14, 1933   Care Coordination Outreach Attempts:  A second unsuccessful outreach was attempted today to offer the patient with information about available complex care management services.  Follow Up Plan:  Additional outreach attempts will be made to offer the patient complex care management information and services.   Encounter Outcome:  No Answer   Care Coordination Interventions:  No, not indicated    Rodney Langton, RN, MSN, CCM [place]  [facility], [facility] RN Care Coordinator Direct Dial: [phone] / Main [phone] Fax [phone] Email: [email or web address]: [email or web address]

## 2023-10-11 ENCOUNTER — Emergency Department

## 2023-10-11 ENCOUNTER — Other Ambulatory Visit: Payer: Self-pay

## 2023-10-11 ENCOUNTER — Inpatient Hospital Stay
Admission: EM | Admit: 2023-10-11 | Discharge: 2023-10-14 | DRG: 291 | Disposition: A | Discharge status: Home / Self Care | Attending: Internal Medicine | Admitting: Internal Medicine

## 2023-10-11 DIAGNOSIS — E440 Moderate protein-calorie malnutrition: Secondary | ICD-10-CM

## 2023-10-11 DIAGNOSIS — I110 Hypertensive heart disease with heart failure: Secondary | ICD-10-CM | POA: Diagnosis not present

## 2023-10-11 DIAGNOSIS — Z1152 Encounter for screening for COVID-19: Secondary | ICD-10-CM | POA: Diagnosis not present

## 2023-10-11 DIAGNOSIS — I480 Paroxysmal atrial fibrillation: Secondary | ICD-10-CM | POA: Diagnosis present

## 2023-10-11 DIAGNOSIS — Z7951 Long term (current) use of inhaled steroids: Secondary | ICD-10-CM | POA: Diagnosis not present

## 2023-10-11 DIAGNOSIS — Z6825 Body mass index (BMI) 25.0-25.9, adult: Secondary | ICD-10-CM

## 2023-10-11 DIAGNOSIS — R918 Other nonspecific abnormal finding of lung field: Secondary | ICD-10-CM | POA: Diagnosis not present

## 2023-10-11 DIAGNOSIS — I130 Hypertensive heart and chronic kidney disease with heart failure and stage 1 through stage 4 chronic kidney disease, or unspecified chronic kidney disease: Principal | ICD-10-CM | POA: Diagnosis present

## 2023-10-11 DIAGNOSIS — Z825 Family history of asthma and other chronic lower respiratory diseases: Secondary | ICD-10-CM

## 2023-10-11 DIAGNOSIS — I10 Essential (primary) hypertension: Secondary | ICD-10-CM | POA: Diagnosis not present

## 2023-10-11 DIAGNOSIS — Z8249 Family history of ischemic heart disease and other diseases of the circulatory system: Secondary | ICD-10-CM

## 2023-10-11 DIAGNOSIS — Z96653 Presence of artificial knee joint, bilateral: Secondary | ICD-10-CM | POA: Diagnosis present

## 2023-10-11 DIAGNOSIS — R0989 Other specified symptoms and signs involving the circulatory and respiratory systems: Secondary | ICD-10-CM | POA: Diagnosis not present

## 2023-10-11 DIAGNOSIS — I4891 Unspecified atrial fibrillation: Secondary | ICD-10-CM | POA: Diagnosis not present

## 2023-10-11 DIAGNOSIS — I509 Heart failure, unspecified: Principal | ICD-10-CM

## 2023-10-11 DIAGNOSIS — Z79899 Other long term (current) drug therapy: Secondary | ICD-10-CM | POA: Diagnosis not present

## 2023-10-11 DIAGNOSIS — Z950 Presence of cardiac pacemaker: Secondary | ICD-10-CM

## 2023-10-11 DIAGNOSIS — J449 Chronic obstructive pulmonary disease, unspecified: Secondary | ICD-10-CM | POA: Diagnosis present

## 2023-10-11 DIAGNOSIS — E1122 Type 2 diabetes mellitus with diabetic chronic kidney disease: Secondary | ICD-10-CM | POA: Diagnosis present

## 2023-10-11 DIAGNOSIS — I161 Hypertensive emergency: Secondary | ICD-10-CM | POA: Diagnosis not present

## 2023-10-11 DIAGNOSIS — R0689 Other abnormalities of breathing: Secondary | ICD-10-CM | POA: Diagnosis not present

## 2023-10-11 DIAGNOSIS — N184 Chronic kidney disease, stage 4 (severe): Secondary | ICD-10-CM | POA: Diagnosis present

## 2023-10-11 DIAGNOSIS — R54 Age-related physical debility: Secondary | ICD-10-CM | POA: Diagnosis present

## 2023-10-11 DIAGNOSIS — I499 Cardiac arrhythmia, unspecified: Secondary | ICD-10-CM | POA: Diagnosis not present

## 2023-10-11 DIAGNOSIS — Z7901 Long term (current) use of anticoagulants: Secondary | ICD-10-CM

## 2023-10-11 DIAGNOSIS — E46 Unspecified protein-calorie malnutrition: Secondary | ICD-10-CM | POA: Diagnosis not present

## 2023-10-11 DIAGNOSIS — J4489 Other specified chronic obstructive pulmonary disease: Secondary | ICD-10-CM | POA: Diagnosis present

## 2023-10-11 DIAGNOSIS — R0602 Shortness of breath: Secondary | ICD-10-CM | POA: Diagnosis not present

## 2023-10-11 DIAGNOSIS — Z90710 Acquired absence of both cervix and uterus: Secondary | ICD-10-CM

## 2023-10-11 DIAGNOSIS — I5043 Acute on chronic combined systolic (congestive) and diastolic (congestive) heart failure: Secondary | ICD-10-CM | POA: Diagnosis present

## 2023-10-11 DIAGNOSIS — I5020 Unspecified systolic (congestive) heart failure: Secondary | ICD-10-CM

## 2023-10-11 DIAGNOSIS — E871 Hypo-osmolality and hyponatremia: Secondary | ICD-10-CM | POA: Diagnosis present

## 2023-10-11 DIAGNOSIS — Z471 Aftercare following joint replacement surgery: Secondary | ICD-10-CM | POA: Diagnosis not present

## 2023-10-11 DIAGNOSIS — I495 Sick sinus syndrome: Secondary | ICD-10-CM | POA: Diagnosis not present

## 2023-10-11 DIAGNOSIS — R262 Difficulty in walking, not elsewhere classified: Secondary | ICD-10-CM | POA: Diagnosis not present

## 2023-10-11 DIAGNOSIS — J9621 Acute and chronic respiratory failure with hypoxia: Secondary | ICD-10-CM | POA: Diagnosis not present

## 2023-10-11 DIAGNOSIS — Z96611 Presence of right artificial shoulder joint: Secondary | ICD-10-CM | POA: Diagnosis present

## 2023-10-11 DIAGNOSIS — I5023 Acute on chronic systolic (congestive) heart failure: Secondary | ICD-10-CM

## 2023-10-11 DIAGNOSIS — Z515 Encounter for palliative care: Secondary | ICD-10-CM | POA: Diagnosis not present

## 2023-10-11 DIAGNOSIS — R069 Unspecified abnormalities of breathing: Secondary | ICD-10-CM | POA: Diagnosis not present

## 2023-10-11 LAB — CBC WITH DIFFERENTIAL/PLATELET
Abs Immature Granulocytes: 0.04 10*3/uL (ref 0.00–0.07)
Basophils Absolute: 0.1 10*3/uL (ref 0.0–0.1)
Basophils Relative: 1 %
Eosinophils Absolute: 0.3 10*3/uL (ref 0.0–0.5)
Eosinophils Relative: 5 %
HCT: 37 % (ref 36.0–46.0)
Hemoglobin: 12.7 g/dL (ref 12.0–15.0)
Immature Granulocytes: 1 %
Lymphocytes Relative: 29 %
Lymphs Abs: 1.6 10*3/uL (ref 0.7–4.0)
MCH: 31.5 pg (ref 26.0–34.0)
MCHC: 34.3 g/dL (ref 30.0–36.0)
MCV: 91.8 fL (ref 80.0–100.0)
Monocytes Absolute: 0.4 10*3/uL (ref 0.1–1.0)
Monocytes Relative: 8 %
Neutro Abs: 3.2 10*3/uL (ref 1.7–7.7)
Neutrophils Relative %: 56 %
Platelets: 276 10*3/uL (ref 150–400)
RBC: 4.03 MIL/uL (ref 3.87–5.11)
RDW: 15.9 % — ABNORMAL HIGH (ref 11.5–15.5)
Smear Review: NORMAL
WBC: 5.7 10*3/uL (ref 4.0–10.5)
nRBC: 0 % (ref 0.0–0.2)

## 2023-10-11 LAB — RESP PANEL BY RT-PCR (RSV, FLU A&B, COVID)  RVPGX2
Influenza A by PCR: NEGATIVE
Influenza B by PCR: NEGATIVE
Resp Syncytial Virus by PCR: NEGATIVE
SARS Coronavirus 2 by RT PCR: NEGATIVE

## 2023-10-11 LAB — COMPREHENSIVE METABOLIC PANEL
ALT: 35 U/L (ref 0–44)
AST: 44 U/L — ABNORMAL HIGH (ref 15–41)
Albumin: 3.4 g/dL — ABNORMAL LOW (ref 3.5–5.0)
Alkaline Phosphatase: 92 U/L (ref 38–126)
Anion gap: 12 (ref 5–15)
BUN: 39 mg/dL — ABNORMAL HIGH (ref 8–23)
CO2: 24 mmol/L (ref 22–32)
Calcium: 9 mg/dL (ref 8.9–10.3)
Chloride: 99 mmol/L (ref 98–111)
Creatinine, Ser: 1.95 mg/dL — ABNORMAL HIGH (ref 0.44–1.00)
GFR, Estimated: 24 mL/min — ABNORMAL LOW (ref >60)
Glucose, Bld: 148 mg/dL — ABNORMAL HIGH (ref 70–99)
Potassium: 3.9 mmol/L (ref 3.5–5.1)
Sodium: 135 mmol/L (ref 135–145)
Total Bilirubin: 1 mg/dL (ref 0.0–1.2)
Total Protein: 6.5 g/dL (ref 6.5–8.1)

## 2023-10-11 LAB — BLOOD GAS, VENOUS
Acid-Base Excess: 1.7 mmol/L (ref 0.0–2.0)
Bicarbonate: 26.6 mmol/L (ref 20.0–28.0)
O2 Saturation: 99.8 %
Patient temperature: 37
pCO2, Ven: 42 mmHg — ABNORMAL LOW (ref 44–60)
pH, Ven: 7.41 (ref 7.25–7.43)
pO2, Ven: 102 mmHg — ABNORMAL HIGH (ref 32–45)

## 2023-10-11 LAB — BRAIN NATRIURETIC PEPTIDE: B Natriuretic Peptide: >4500 pg/mL — ABNORMAL HIGH (ref 0.0–100.0)

## 2023-10-11 LAB — TROPONIN I (HIGH SENSITIVITY)
Troponin I (High Sensitivity): 37 ng/L — ABNORMAL HIGH (ref <18)
Troponin I (High Sensitivity): 34 ng/L — ABNORMAL HIGH (ref <18)

## 2023-10-11 MED ORDER — FLUTICASONE FUROATE-VILANTEROL 100-25 MCG/ACT IN AEPB
1.0000 | INHALATION_SPRAY | Freq: Every day | RESPIRATORY_TRACT | Status: DC
  Administered 2023-10-11 – 2023-10-14 (×4): 1 via RESPIRATORY_TRACT
  Filled 2023-10-11: qty 28

## 2023-10-11 MED ORDER — UMECLIDINIUM BROMIDE 62.5 MCG/ACT IN AEPB
1.0000 | INHALATION_SPRAY | Freq: Every day | RESPIRATORY_TRACT | Status: DC
  Administered 2023-10-11 – 2023-10-14 (×4): 1 via RESPIRATORY_TRACT
  Filled 2023-10-11: qty 7

## 2023-10-11 MED ORDER — ONDANSETRON HCL 4 MG/2ML IJ SOLN: 4.0000 mg | Freq: Four times a day (QID) | INTRAMUSCULAR | Status: DC | PRN

## 2023-10-11 MED ORDER — AMIODARONE HCL 200 MG PO TABS
200.0000 mg | ORAL_TABLET | Freq: Every day | ORAL | Status: DC
  Administered 2023-10-11 – 2023-10-14 (×4): 200 mg via ORAL
  Filled 2023-10-11 (×4): qty 1

## 2023-10-11 MED ORDER — METHYLPREDNISOLONE SODIUM SUCC 125 MG IJ SOLR
125.0000 mg | Freq: Once | INTRAMUSCULAR | Status: AC
  Administered 2023-10-11: 125 mg via INTRAVENOUS
  Filled 2023-10-11: qty 2

## 2023-10-11 MED ORDER — FUROSEMIDE 10 MG/ML IJ SOLN
40.0000 mg | Freq: Once | INTRAMUSCULAR | Status: AC
  Administered 2023-10-11: 40 mg via INTRAVENOUS
  Filled 2023-10-11: qty 4

## 2023-10-11 MED ORDER — IPRATROPIUM-ALBUTEROL 0.5-2.5 (3) MG/3ML IN SOLN
3.0000 mL | Freq: Once | RESPIRATORY_TRACT | Status: AC
  Administered 2023-10-11: 3 mL via RESPIRATORY_TRACT
  Filled 2023-10-11: qty 3

## 2023-10-11 MED ORDER — TRAZODONE HCL 100 MG PO TABS
100.0000 mg | ORAL_TABLET | Freq: Every day | ORAL | Status: DC
Start: 2023-10-11 — End: 2023-10-14
  Administered 2023-10-11 – 2023-10-13 (×3): 100 mg via ORAL
  Filled 2023-10-11 (×4): qty 1

## 2023-10-11 MED ORDER — SODIUM CHLORIDE 0.9 % IV SOLN: 250.0000 mL | INTRAVENOUS | Status: AC | PRN

## 2023-10-11 MED ORDER — CARVEDILOL 6.25 MG PO TABS
6.2500 mg | ORAL_TABLET | Freq: Two times a day (BID) | ORAL | Status: DC
  Administered 2023-10-11 – 2023-10-14 (×6): 6.25 mg via ORAL
  Filled 2023-10-11 (×6): qty 1

## 2023-10-11 MED ORDER — APIXABAN 2.5 MG PO TABS
2.5000 mg | ORAL_TABLET | Freq: Two times a day (BID) | ORAL | Status: DC
  Administered 2023-10-11 – 2023-10-14 (×6): 2.5 mg via ORAL
  Filled 2023-10-11 (×6): qty 1

## 2023-10-11 MED ORDER — ONDANSETRON HCL 4 MG/2ML IJ SOLN
4.0000 mg | Freq: Once | INTRAMUSCULAR | Status: AC
  Administered 2023-10-11: 4 mg via INTRAVENOUS
  Filled 2023-10-11: qty 2

## 2023-10-11 MED ORDER — SODIUM CHLORIDE 0.9% FLUSH: 3.0000 mL | INTRAVENOUS | Status: DC | PRN

## 2023-10-11 MED ORDER — HYDRALAZINE HCL 20 MG/ML IJ SOLN
5.0000 mg | Freq: Four times a day (QID) | INTRAMUSCULAR | Status: DC | PRN
  Administered 2023-10-11: 5 mg via INTRAVENOUS
  Filled 2023-10-11: qty 1

## 2023-10-11 MED ORDER — ACETAMINOPHEN 325 MG PO TABS
650.0000 mg | ORAL_TABLET | ORAL | Status: DC | PRN
Start: 2023-10-11 — End: 2023-10-14

## 2023-10-11 MED ORDER — ISOSORBIDE MONONITRATE ER 30 MG PO TB24
30.0000 mg | ORAL_TABLET | Freq: Every day | ORAL | Status: DC
Start: 2023-10-11 — End: 2023-10-14
  Administered 2023-10-11 – 2023-10-13 (×3): 30 mg via ORAL
  Filled 2023-10-11 (×3): qty 1

## 2023-10-11 MED ORDER — COLESTIPOL HCL 1 G PO TABS
1.0000 g | ORAL_TABLET | Freq: Two times a day (BID) | ORAL | Status: DC
  Administered 2023-10-11 – 2023-10-14 (×7): 1 g via ORAL
  Filled 2023-10-11 (×7): qty 1

## 2023-10-11 MED ORDER — SODIUM CHLORIDE 0.9% FLUSH
3.0000 mL | Freq: Two times a day (BID) | INTRAVENOUS | Status: DC
  Administered 2023-10-11 – 2023-10-14 (×7): 3 mL via INTRAVENOUS

## 2023-10-11 MED ORDER — FUROSEMIDE 10 MG/ML IJ SOLN
40.0000 mg | Freq: Two times a day (BID) | INTRAMUSCULAR | Status: DC
  Administered 2023-10-11 – 2023-10-12 (×2): 40 mg via INTRAVENOUS
  Filled 2023-10-11 (×2): qty 4

## 2023-10-11 MED ORDER — IPRATROPIUM-ALBUTEROL 0.5-2.5 (3) MG/3ML IN SOLN: 3.0000 mL | Freq: Four times a day (QID) | RESPIRATORY_TRACT | Status: DC | PRN

## 2023-10-11 MED ORDER — PANTOPRAZOLE SODIUM 40 MG PO TBEC
40.0000 mg | DELAYED_RELEASE_TABLET | Freq: Every day | ORAL | Status: DC
  Administered 2023-10-11 – 2023-10-14 (×4): 40 mg via ORAL
  Filled 2023-10-11 (×4): qty 1

## 2023-10-11 MED ORDER — HYDRALAZINE HCL 50 MG PO TABS
50.0000 mg | ORAL_TABLET | Freq: Three times a day (TID) | ORAL | Status: DC
  Administered 2023-10-11 – 2023-10-14 (×9): 50 mg via ORAL
  Filled 2023-10-11 (×10): qty 1

## 2023-10-11 NOTE — Progress Notes (Signed)
 Heart Failure Navigator Progress Note  Assessed for Heart & Vascular TOC clinic readiness.  Patient does not meet criteria due to current [facility] patient.   Navigator will sign off at this time.  [name], RN, BSN [facility] Heart Failure Navigator Secure Chat Only

## 2023-10-11 NOTE — ED Notes (Signed)
 Pt cleaned after urinating. New sheets, bed pads, and brief applied.

## 2023-10-11 NOTE — ED Provider Notes (Signed)
 [facility] Provider Note    Event Date/Time   First MD Initiated Contact with Patient 10/11/23 1012     (approximate)   History   Respiratory Distress   HPI  Leslie Parker is a 89 y.o. female with CHF with an EF of 25 to 30%, CKD, A-fib on Eliquis, diabetes, COPD who comes in with concerns for respiratory distress.  Patient reports that she felt fine yesterday when she woke this morning she developed shortness of breath.  She states that she then tried to take her Trelegy and then she started feeling nauseous all over.  Unable to vomit but just overall did not feel well with increasing shortness of breath.  When EMS got there her sats were 88% patient was placed on 3 L of oxygen.  Patient does have a pacemaker for sick sinus syndrome.  She denies any syncope or near syncopal episodes.   Physical Exam   Triage Vital Signs: ED Triage Vitals [10/11/23 1012]  Encounter Vitals Group     BP      Systolic BP Percentile      Diastolic BP Percentile      Pulse      Resp      Temp      Temp src      SpO2      Weight      Height      Head Circumference      Peak Flow      Pain Score 0     Pain Loc      Pain Education      Exclude from Growth Chart     Most recent vital signs: Vitals:   10/11/23 1015 10/11/23 1022  BP:  (!) 194/103  Pulse: 83   Resp: 14   Temp: 98.3 F (36.8 C)   SpO2: 91%      General: Awake, mild distress CV:  Good peripheral perfusion.  Resp:    Tight air exchange with occasional wheeze.  Increased work of breathing Abd:  No distention.  Other:  Edema noted bilaterally   ED Results / Procedures / Treatments   Labs (all labs ordered are listed, but only abnormal results are displayed) Labs Reviewed  RESP PANEL BY RT-PCR (RSV, FLU A&B, COVID)  [id number]  CBC WITH DIFFERENTIAL/PLATELET  COMPREHENSIVE METABOLIC PANEL  BRAIN NATRIURETIC PEPTIDE  BLOOD GAS, VENOUS  TROPONIN I (HIGH SENSITIVITY)     EKG  My  interpretation of EKG:  Sinus rate of 82 without any ST elevation or T wave inversion, bundle-branch block  RADIOLOGY I have reviewed the xray personally and interpreted positive edema   PROCEDURES:  Critical Care performed: Yes, see critical care procedure note(s)  .Critical Care  Performed by: Concha Se, MD Authorized by: Concha Se, MD   Critical care provider statement:    Critical care time (minutes):  30   Critical care was necessary to treat or prevent imminent or life-threatening deterioration of the following conditions:  Respiratory failure   Critical care was time spent personally by me on the following activities:  Development of treatment plan with patient or surrogate, discussions with consultants, evaluation of patient's response to treatment, examination of patient, ordering and review of laboratory studies, ordering and review of radiographic studies, ordering and performing treatments and interventions, pulse oximetry, re-evaluation of patient's condition and review of old charts .1-3 Lead EKG Interpretation  Performed by: Concha Se, MD Authorized by: Artis Delay  E, MD     Interpretation: normal     ECG rate assessment: normal     Ectopy: none     Conduction: normal      MEDICATIONS ORDERED IN ED: Medications  ipratropium-albuterol (DUONEB) 0.5-2.5 (3) MG/3ML nebulizer solution 3 mL (3 mLs Nebulization Given 10/11/23 1043)  methylPREDNISolone sodium succinate (SOLU-MEDROL) 125 mg/2 mL injection 125 mg (125 mg Intravenous Given 10/11/23 1038)  ondansetron (ZOFRAN) injection 4 mg (4 mg Intravenous Given 10/11/23 1038)  furosemide (LASIX) injection 40 mg (40 mg Intravenous Given 10/11/23 1047)     IMPRESSION / MDM / ASSESSMENT AND PLAN / ED COURSE  I reviewed the triage vital signs and the nursing notes.   Patient's presentation is most consistent with severe exacerbation of chronic illness.   Patient comes in with concern for shortness of breath.   After reviewing patient's prior hospital admissions I suspect this is more likely related to CHF.  She does have a little bit of wheeze so I will trial a DuoNeb, steroids.  Given her work of breathing although her oxygen levels are staying between 89 to 91% I will place patient on BiPAP.  Will give patient a dose of Lasix after reviewed her chest x-ray does look consistent with pulmonary edema.  Her VBG does not show any evidence of hypercapnia.  CBC shows no white count elevation no fever to suggest sepsis.  Her CMP shows creatinine that is downtrending.  Her troponin is slightly elevated but down from previous I suspect more likely demand.  Patient resting more comfortable on BiPAP will discuss with the hospital team for admission for CHF  The patient is on the cardiac monitor to evaluate for evidence of arrhythmia and/or significant heart rate changes.      FINAL CLINICAL IMPRESSION(S) / ED DIAGNOSES   Final diagnoses:  Acute on chronic congestive heart failure, unspecified heart failure type (HCC)     Rx / DC Orders   ED Discharge Orders     None        Note:  This document was prepared using Dragon voice recognition software and may include unintentional dictation errors.   Concha Se, MD 10/11/23 [phone]

## 2023-10-11 NOTE — ED Notes (Signed)
 Pt given phone to speak with Arletta Bale, POA

## 2023-10-11 NOTE — ED Notes (Signed)
 PT speaking with grandson on the phone at this time

## 2023-10-11 NOTE — ED Triage Notes (Signed)
 Pt arrives via EMS from home for respiratory distress this morning. Pt used an inhaler but didn't get relief. EMS found her satting at 88% on RA. Does not use oxygen at home at baseline. EMS put pt on 4L and that brought her up to 96% SPO2. Hx of a pacemaker   EMS vitals: 23-30 RR 203/100 BP 80s HR

## 2023-10-11 NOTE — ED Notes (Addendum)
 Pt placed on bedpan , 1 urine occurrence noted, brief changed and pt repositioned in the bed

## 2023-10-11 NOTE — ED Notes (Signed)
 Patient moved to C-pod. Patient placed in hospital bed. Meal tray set up for patient. Call light within reach.

## 2023-10-11 NOTE — ED Notes (Signed)
 Pt placed on bedpan at this time. Call light within reach.

## 2023-10-11 NOTE — ED Notes (Signed)
 Pt cleaned up after urinating. New brief and bed pad applied

## 2023-10-11 NOTE — H&P (Signed)
 History and Physical    Leslie Parker:[id number] DOB: [birth year] DOA: 10/11/2023  PCP: [name], MD (Confirm with patient/family/NH records and if not entered, this has to be entered at [facility] point of entry) Patient coming from: Assisted living  I have personally briefly reviewed patient's old medical records in [facility]  Chief Complaint: Shortness of breath  HPI: Leslie Parker is a 89 y.o. female with medical history significant of chronic HFrEF with LVEF 25 to 30%, COPD Gold stage III, SSS status post PPM, PAF on Eliquis, CKD stage IV, HTN, IIDM, presented with sudden onset of shortness of breath.  Symptoms started last night, patient woke up with severe shortness of breath denies any chest pain no cough no fever or chills.  She says she has been compliant with her BP/CHF medications including Coreg and 3 times a day hydralazine and the daily Lasix.  She lives by herself in assisted living, granddaughter drops by occasionally to check her welfare.  ED Course: Afebrile, blood pressure significantly elevated 198/100, O2 saturation 90% on room air and patient was started on BiPAP 14/6 40% oxygen.  Chest x-ray showed pulmonary congestion and worsening of bilateral pleural effusion.  And blood work showed creatinine 1.9 compared to baseline 2.2-2.5, glucose 148.  ABG 7.41/42/102.  Patient was given IV Lasix 40 mg x 1 and started on BiPAP.  Review of Systems: As per HPI otherwise 14 point review of systems negative.    Past Medical History:  Diagnosis Date   Acute kidney injury superimposed on CKD (HCC) 11/24/2022   Asthma    Atrial fibrillation with RVR (HCC)    Cancer (HCC)    Basal Cell   Closed left hip fracture (HCC) 01/25/2017   Closed right hip fracture (HCC) 02/13/2023   Diabetes mellitus without complication (HCC)    Femur fracture, left (HCC) 02/03/2015   Heart murmur    Hypertension    Impetigo    Osteoarthritis    Osteopenia    Pacemaker lead  malfunction 11/28/2022   Rapid atrial fibrillation, new onset(HCC) 06/05/2022   Vomiting    can not due to surgery   Wears dentures    full upper and lower    Past Surgical History:  Procedure Laterality Date   ABDOMINAL HYSTERECTOMY     BACK SURGERY     BLADDER SURGERY     mesh   CARPAL TUNNEL RELEASE Bilateral    CATARACT EXTRACTION Right 2017   CATARACT EXTRACTION W/PHACO Left 02/06/2016   Procedure: CATARACT EXTRACTION PHACO AND INTRAOCULAR LENS PLACEMENT (IOC) left eye;  Surgeon: Sherald Hess, MD;  Location: [facility];  Service: Ophthalmology;  Laterality: Left;  DIABETIC LEFT Cannot arrive before 9:30   CERVICAL DISC SURGERY     CHOLECYSTECTOMY     EYE SURGERY Bilateral    Cataract Extraction with IOL   FEMUR IM NAIL Left 02/04/2015   Procedure: INTRAMEDULLARY (IM) NAIL FEMORAL;  Surgeon: Juanell Fairly, MD;  Location: [facility];  Service: Orthopedics;  Laterality: Left;   HARDWARE REMOVAL Left 01/17/2017   Procedure: HARDWARE REMOVAL;  Surgeon: Juanell Fairly, MD;  Location: [facility];  Service: Orthopedics;  Laterality: Left;   HERNIA REPAIR  2014   esophageal and gastric mesh. patient unable to throw up d/t mesh   HIP ARTHROPLASTY Left 01/26/2017   Procedure: ARTHROPLASTY BIPOLAR HIP (HEMIARTHROPLASTY) removal hardware left hip;  Surgeon: Deeann Saint, MD;  Location: [facility];  Service: Orthopedics;  Laterality: Left;   INTRAMEDULLARY (  IM) NAIL INTERTROCHANTERIC Right 02/13/2023   Procedure: INTRAMEDULLARY (IM) NAIL INTERTROCHANTERIC;  Surgeon: Christena Flake, MD;  Location: [facility];  Service: Orthopedics;  Laterality: Right;   JOINT REPLACEMENT Bilateral    knees   PACEMAKER IMPLANT N/A 11/15/2022   Procedure: PACEMAKER IMPLANT;  Surgeon: Marcina Millard, MD;  Location: [facility];  Service: Cardiovascular;  Laterality: N/A;   PACEMAKER IMPLANT N/A 11/28/2022   Procedure: PACEMAKER IMPLANT;  Surgeon: Marcina Millard, MD;  Location:  [facility];  Service: Cardiovascular;  Laterality: N/A;  Lead reposition   REPLACEMENT TOTAL KNEE BILATERAL Bilateral [id number]   SHOULDER ARTHROSCOPY WITH OPEN ROTATOR CUFF REPAIR AND DISTAL CLAVICLE ACROMINECTOMY Left 10/25/2016   Procedure: SHOULDER ARTHROSCOPY WITH OPEN ROTATOR CUFF REPAIR AND DISTAL CLAVICLE ACROMINECTOMY;  Surgeon: Juanell Fairly, MD;  Location: [facility];  Service: Orthopedics;  Laterality: Left;   THYROID SURGERY     goiter removed   TOTAL HIP REVISION Left 12/02/2017   Procedure: TOTAL HIP REVISION;  Surgeon: Lyndle Herrlich, MD;  Location: [facility];  Service: Orthopedics;  Laterality: Left;   TOTAL SHOULDER REPLACEMENT Right 2012     reports that she has never smoked. She has never used smokeless tobacco. She reports that she does not drink alcohol and does not use drugs.  Allergies  Allergen Reactions   Baclofen Other (See Comments)    Elevated Cr   Simvastatin Other (See Comments)    Pt denies  Elevated LFTs with simva 40mg , stopped and resolved (see MD note 11/10/13)    Family History  Problem Relation Age of Onset   Heart failure Mother    Hypertension Mother    Emphysema Father      Prior to Admission medications   Medication Sig Start Date End Date Taking? Authorizing Provider  acetaminophen (TYLENOL) 325 MG tablet Take 2 tablets (650 mg total) by mouth every 4 (four) hours as needed for headache or mild pain (pain score 1-3). 09/19/23   Lurene Shadow, MD  albuterol (VENTOLIN HFA) 108 (90 Base) MCG/ACT inhaler Inhale 2 puffs into the lungs every 6 (six) hours as needed. 08/04/19   [provider]  amiodarone (PACERONE) 200 MG tablet Take 1 tablet by mouth daily. 08/26/23   [provider]  apixaban (ELIQUIS) 2.5 MG TABS tablet Take 1 tablet (2.5 mg total) by mouth 2 (two) times daily. 06/07/22   Tresa Moore, MD  ascorbic acid (VITAMIN C) 250 MG CHEW Chew 250 mg by mouth daily.    [provider]  carvedilol  (COREG) 6.25 MG tablet Take 1 tablet (6.25 mg total) by mouth 2 (two) times daily with a meal. 08/11/23 11/09/23  Carollee Herter, DO  Cholecalciferol (VITAMIN D-1000 MAX ST) 25 MCG (1000 UT) tablet Take 1,000 Units by mouth daily.    [provider]  colestipol (COLESTID) 1 g tablet Take 1 tablet by mouth 2 (two) times daily. 06/01/22   [provider]  Cyanocobalamin (B-12) 2500 MCG TABS Take 2,500 mcg by mouth daily.    [provider]  furosemide (LASIX) 20 MG tablet Take 20 mg by mouth daily as needed. 08/16/23   [provider]  hydrALAZINE (APRESOLINE) 50 MG tablet Take 1 tablet (50 mg total) by mouth 3 (three) times daily. 08/11/23 11/09/23  Carollee Herter, DO  ipratropium-albuterol (DUONEB) 0.5-2.5 (3) MG/3ML SOLN Inhale 3 mLs into the lungs every 6 (six) hours as needed. 06/01/22   [provider]  isosorbide mononitrate (IMDUR) 30 MG  24 hr tablet Take 1 tablet (30 mg total) by mouth at bedtime. 08/11/23 11/09/23  Carollee Herter, DO  Magnesium Oxide 500 MG CAPS Take 500 mg by mouth daily.    [provider]  Multiple Vitamin (MULTIVITAMIN WITH MINERALS) TABS tablet Take 1 tablet by mouth daily. One-A-Day Women's Vitamin    [provider]  omeprazole (PRILOSEC) 20 MG capsule Take 20 mg by mouth every morning. 01/10/23   [provider]  traZODone (DESYREL) 100 MG tablet Take 100 mg by mouth at bedtime.    [provider]  TRELEGY ELLIPTA 100-62.5-25 MCG/ACT AEPB Inhale 1 puff into the lungs daily. 08/03/22   [provider]    Physical Exam: Vitals:   10/11/23 1015 10/11/23 1022  BP:  (!) 194/103  Pulse: 83   Resp: 14   Temp: 98.3 F (36.8 C)   TempSrc: Oral   SpO2: 91%     Constitutional: NAD, calm, comfortable Vitals:   10/11/23 1015 10/11/23 1022  BP:  (!) 194/103  Pulse: 83   Resp: 14   Temp: 98.3 F (36.8 C)   TempSrc: Oral   SpO2: 91%    Eyes: PERRL, lids and conjunctivae normal ENMT: Mucous  membranes are moist. Posterior pharynx clear of any exudate or lesions.Normal dentition.  Neck: normal, supple, no masses, no thyromegaly Respiratory: clear to auscultation bilaterally, no wheezing, increasing bilateral crackles, increasing respiratory effort. No accessory muscle use.  Cardiovascular: Regular rate and rhythm, no murmurs / rubs / gallops. 2+ extremity edema. 2+ pedal pulses. No carotid bruits.  Abdomen: no tenderness, no masses palpated. No hepatosplenomegaly. Bowel sounds positive.  Musculoskeletal: no clubbing / cyanosis. No joint deformity upper and lower extremities. Good ROM, no contractures. Normal muscle tone.  Skin: no rashes, lesions, ulcers. No induration Neurologic: CN 2-12 grossly intact. Sensation intact, DTR normal. Strength 5/5 in all 4.  Psychiatric: Normal judgment and insight. Alert and oriented x 3. Normal mood.     Labs on Admission: I have personally reviewed following labs and imaging studies  CBC: Recent Labs  Lab 10/11/23 1026  WBC 5.7  NEUTROABS 3.2  HGB 12.7  HCT 37.0  MCV 91.8  PLT 276   Basic Metabolic Panel: Recent Labs  Lab 10/11/23 1026  NA 135  K 3.9  CL 99  CO2 24  GLUCOSE 148*  BUN 39*  CREATININE 1.95*  CALCIUM 9.0   GFR: CrCl cannot be calculated (Unknown ideal weight.). Liver Function Tests: Recent Labs  Lab 10/11/23 1026  AST 44*  ALT 35  ALKPHOS 92  BILITOT 1.0  PROT 6.5  ALBUMIN 3.4*   No results for input(s): "LIPASE", "AMYLASE" in the last 168 hours. No results for input(s): "AMMONIA" in the last 168 hours. Coagulation Profile: No results for input(s): "INR", "PROTIME" in the last 168 hours. Cardiac Enzymes: No results for input(s): "CKTOTAL", "CKMB", "CKMBINDEX", "TROPONINI" in the last 168 hours. BNP (last 3 results) No results for input(s): "PROBNP" in the last 8760 hours. HbA1C: No results for input(s): "HGBA1C" in the last 72 hours. CBG: No results for input(s): "GLUCAP" in the last 168  hours. Lipid Profile: No results for input(s): "CHOL", "HDL", "LDLCALC", "TRIG", "CHOLHDL", "LDLDIRECT" in the last 72 hours. Thyroid Function Tests: No results for input(s): "TSH", "T4TOTAL", "FREET4", "T3FREE", "THYROIDAB" in the last 72 hours. Anemia Panel: No results for input(s): "VITAMINB12", "FOLATE", "FERRITIN", "TIBC", "IRON", "RETICCTPCT" in the last 72 hours. Urine analysis:    Component Value Date/Time   COLORURINE YELLOW (  A) 09/15/2023 0848   APPEARANCEUR CLEAR (A) 09/15/2023 0848   APPEARANCEUR Clear 02/19/2013 0254   LABSPEC 1.011 09/15/2023 0848   LABSPEC 1.006 02/19/2013 0254   PHURINE 6.0 09/15/2023 0848   GLUCOSEU >=500 (A) 09/15/2023 0848   GLUCOSEU Negative 02/19/2013 0254   HGBUR NEGATIVE 09/15/2023 0848   BILIRUBINUR NEGATIVE 09/15/2023 0848   BILIRUBINUR Negative 02/19/2013 0254   KETONESUR NEGATIVE 09/15/2023 0848   PROTEINUR 100 (A) 09/15/2023 0848   NITRITE NEGATIVE 09/15/2023 0848   LEUKOCYTESUR TRACE (A) 09/15/2023 0848   LEUKOCYTESUR 1+ 02/19/2013 0254    Radiological Exams on Admission: DG Chest Portable 1 View Result Date: 10/11/2023 CLINICAL DATA:  Shortness of breath. EXAM: PORTABLE CHEST 1 VIEW COMPARISON:  09/15/2023. FINDINGS: Low lung volume. Moderate diffuse pulmonary vascular congestion and bilateral small layering pleural effusions. There are additional left retrocardiac opacities, which are similar to the prior study and may represent left lung atelectasis and/or consolidation. Bilateral lung fields are otherwise clear. No pneumothorax. Stable mildly enlarged cardio-mediastinal silhouette. There is a left sided 2-lead pacemaker. No acute osseous abnormalities. Right reverse shoulder arthroplasty noted. The soft tissues are within normal limits. IMPRESSION: Findings favor congestive heart failure/pulmonary edema. Electronically Signed   By: Jules Schick M.D.   On: 10/11/2023 10:46    EKG: Independently reviewed.  Regular rhythm, chronic ST  changes, no acute ST changes.  Assessment/Plan Active Problems:   Acute on chronic combined systolic and diastolic CHF (congestive heart failure) (HCC)   HTN (hypertension), malignant   CHF (congestive heart failure) (HCC)  (please populate well all problems here in Problem List. (For example, if patient is on BP meds at home and you resume or decide to hold them, it is a problem that needs to be her. Same for CAD, COPD, HLD and so on)  Acute hypoxic respiratory failure  Acute on chronic combined HFrEF and HFpEF decompensation Hypertension emergency, with signs of acute endorgan damage of acute CHF decompensation -Aggressive control of BP, resume Coreg, hydralazine and Imdur, add as needed hydralazine. -IV diuresis 40 mg Lasix twice daily -Echocardiogram was done less than 2 months ago, will not repeat at this time -Patient has history of advanced CHF and recurrent hospital visit for CHF decompensation indicating for poor prognosis.  Patient aware, and record showed that patient had conversation with palliative care/hospice on last hospital stay and patient remains adamant to be full code and would not like to engage in further change of CODE STATUS discussion at this point.  Full code.  Hypertension emergency -Resume home BP meds as above -Patient report she has been compliant with her BP medications including 2 times a day Coreg and 3 times a day hydralazine.  Will consult case management for possible visiting nurse service.  CKD stage IV -Creatinine level stable, fluid overload, diuresis as above and check daily kidney function  PAF -In sinus rhythm, continue renal dosed Eliquis and amiodarone  COPD Gold stage IV -No symptoms signs of acute exacerbation  SSS PPM -No acute concern, continue telemonitoring  DVT prophylaxis: Eliquis Code Status: Full code Family Communication: None at bedside Disposition Plan: Patient is sick with CHF decompensation acute hypoxia requiring BiPAP  and IV diuresis while monitoring kidney function changes, expect more than 2 midnight hospital stay Consults called: None Admission status: PCU admit   Emeline General MD [facility] Pager [phone]  10/11/2023, 11:46 AM

## 2023-10-11 NOTE — ED Notes (Signed)
 Warm blanket given to pt

## 2023-10-12 ENCOUNTER — Inpatient Hospital Stay

## 2023-10-12 DIAGNOSIS — J449 Chronic obstructive pulmonary disease, unspecified: Secondary | ICD-10-CM

## 2023-10-12 DIAGNOSIS — I480 Paroxysmal atrial fibrillation: Secondary | ICD-10-CM | POA: Diagnosis not present

## 2023-10-12 DIAGNOSIS — N184 Chronic kidney disease, stage 4 (severe): Secondary | ICD-10-CM

## 2023-10-12 DIAGNOSIS — I5043 Acute on chronic combined systolic (congestive) and diastolic (congestive) heart failure: Secondary | ICD-10-CM

## 2023-10-12 DIAGNOSIS — I10 Essential (primary) hypertension: Secondary | ICD-10-CM | POA: Diagnosis not present

## 2023-10-12 DIAGNOSIS — I495 Sick sinus syndrome: Secondary | ICD-10-CM

## 2023-10-12 LAB — BASIC METABOLIC PANEL
Anion gap: 8 (ref 5–15)
BUN: 42 mg/dL — ABNORMAL HIGH (ref 8–23)
CO2: 26 mmol/L (ref 22–32)
Calcium: 8.3 mg/dL — ABNORMAL LOW (ref 8.9–10.3)
Chloride: 100 mmol/L (ref 98–111)
Creatinine, Ser: 2.05 mg/dL — ABNORMAL HIGH (ref 0.44–1.00)
GFR, Estimated: 23 mL/min — ABNORMAL LOW (ref >60)
Glucose, Bld: 163 mg/dL — ABNORMAL HIGH (ref 70–99)
Potassium: 4 mmol/L (ref 3.5–5.1)
Sodium: 134 mmol/L — ABNORMAL LOW (ref 135–145)

## 2023-10-12 MED ORDER — TORSEMIDE 20 MG PO TABS
20.0000 mg | ORAL_TABLET | Freq: Every day | ORAL | Status: DC
  Administered 2023-10-13: 20 mg via ORAL
  Filled 2023-10-12: qty 1

## 2023-10-12 NOTE — Assessment & Plan Note (Signed)
 S/p pacemaker placement. No acute concern -Continue with outpatient follow-up

## 2023-10-12 NOTE — Assessment & Plan Note (Signed)
 History of COPD Gold stage IV. No wheezing, but might be contributory to shortness of breath and might be becoming oxygen dependent. -As needed bronchodilator

## 2023-10-12 NOTE — Progress Notes (Signed)
 Progress Note   Patient: Leslie Parker:[id number] DOB: 1933/02/27 DOA: 10/11/2023     1 DOS: the patient was seen and examined on 10/12/2023   Brief hospital course: Taken from H&P.  Leslie Parker is a 89 y.o. female with medical history significant of chronic HFrEF with LVEF 25 to 30%, COPD Gold stage III, SSS status post PPM, PAF on Eliquis, CKD stage IV, HTN, IIDM, presented with sudden onset of shortness of breath.   On presentation blood pressure was significantly elevated at 198/100, saturating 90% on room air but started on BiPAP due to increased work of breathing. Chest x-ray with pulmonary congestion and worsening of bilateral pleural effusion.  Labs with creatinine of 1.9(baseline appears to be around 2.2),  ABG 7.41/42/102.   Patient was started on IV diuresis.  On chart review patient was advised palliative care/hospice during previous heart breath hospitalization but she stated adamant to remain full code and full scope of care and would not engage in further discussion.  Fifth hospitalization since the start of 2025.  3/22: Vital stable, transition to 3 L of oxygen.  Labs with slight increase in creatinine to 2.05 and BUN of 42.  Discontinuing further IV Lasix and starting on low-dose p.o. torsemide from tomorrow. Palliative care was again consulted-hoping she can follow-up with them as outpatient and they can control her symptoms to prevent recurrent hospitalizations.      Assessment and Plan: * Acute on chronic combined systolic and diastolic CHF (congestive heart failure) (HCC) Acute hypoxic respiratory failure. EF of 25 to 30% according to the echo done in January 2025, BNP significantly elevated about 4500 which is the case for multiple prior hospitalizations.  Clinically does not appear much volume overload.  Still requiring 2 L of oxygen, per patient she does not use oxygen at baseline.  Also has an history of COPD Gold stage IV.  Both can be contributory. Patient  was advised to be hospice by heart failure team but she declined and wants to stay full code with full scope of care. Slight increase in creatinine although remained within baseline with IV Lasix. -Palliative care was reconsulted -Switching IV Lasix with torsemide from tomorrow -Daily weight and BMP -Strict intake and output   HTN (hypertension), malignant Blood pressure mildly elevated but significantly improved from admission. -Continue Coreg, hydralazine, Imdur and torsemide  Paroxysmal atrial fibrillation (HCC) -Continue Eliquis, amiodarone and Coreg  COPD (chronic obstructive pulmonary disease) (HCC) History of COPD Gold stage IV. No wheezing, but might be contributory to shortness of breath and might be becoming oxygen dependent. -As needed bronchodilator  CKD (chronic kidney disease), stage IV (HCC) Creatinine remains stable within baseline although slightly increased from admission. -Monitor renal function -Avoid nephrotoxins  Sick sinus syndrome [facility]) S/p pacemaker placement. No acute concern -Continue with outpatient follow-up      Subjective: Patient was seen and examined today.  Denies any chest pain.  Per patient she does not use oxygen at home.  Physical Exam: Vitals:   10/12/23 0930 10/12/23 0941 10/12/23 0943 10/12/23 1038  BP: (!) 134/55 (!) 134/55  (!) 146/98  Pulse: 61   61  Resp: 19   19  Temp:   98 F (36.7 C) 98.6 F (37 C)  TempSrc:   Oral   SpO2: 100%   95%   General. Frail elderly lady, in no acute distress. Pulmonary.  Lungs clear bilaterally, normal respiratory effort. CV.  Regular rate and rhythm, no JVD, rub or murmur. Abdomen.  Soft, nontender, nondistended, BS positive. CNS.  Alert and oriented .  No focal neurologic deficit. Extremities.  Trace LE edema, no cyanosis, pulses intact and symmetrical.   Data Reviewed: Prior data reviewed  Family Communication:   Disposition: Status is: Inpatient Remains inpatient appropriate  because: Severity of illness  Planned Discharge Destination: Home with Home Health  DVT prophylaxis.  Eliquis Time spent: 50 minutes  This record has been created using Conservation officer, historic buildings. Errors have been sought and corrected,but may not always be located. Such creation errors do not reflect on the standard of care.   Author: Arnetha Courser, MD 10/12/2023 12:44 PM  For on call review [email or web address].

## 2023-10-12 NOTE — Evaluation (Signed)
 Physical Therapy Evaluation Patient Details Name: Leslie Parker MRN: [id number] DOB: 05/05/1933 Today's Date: 10/12/2023  History of Present Illness  presented to ER secondary to acute worsening of SOB; admitted for management of acute hypoxic respiratory failure secondary to acute/chronic CHF  Clinical Impression  Patient resting in bed upon arrival to room; alert and oriented, follows commands and agreeable to participation with session.  Denies pain.  Bilat UE/LE strength and ROM grossly symmetrical and WFL for basic transfers and gait; does endorse baseline weakness to R > L LE (reports pending MRI to evaluate for spine etiology).  Currently able to complete bed mobility with supervision; sit/stand, basic transfers and gait (25') with RW, cga/close sup.  Demonstrates mild forward flexed posture, decreased step height/length (mild steppage gait pattern L > R), decreased cadence; however, generally safe and stable, feels confident in performance and feels it near baseline for her "as long as I have the walker to hold to". Minimal SOB with gait efforts, sats >92% on 2L [place]    Would benefit from skilled PT to address above deficits and promote optimal return to PLOF.; recommend post-acute PT follow up as indicated by interdisciplinary care team.          If plan is discharge home, recommend the following: A little help with walking and/or transfers;A little help with bathing/dressing/bathroom   Can travel by private vehicle        Equipment Recommendations    Recommendations for Other Services       Functional Status Assessment Patient has had a recent decline in their functional status and demonstrates the ability to make significant improvements in function in a reasonable and predictable amount of time.     Precautions / Restrictions Precautions Precautions: Fall Restrictions Weight Bearing Restrictions Per Provider Order: No      Mobility  Bed Mobility Overal bed mobility: Needs  Assistance Bed Mobility: Supine to Sit     Supine to sit: Supervision          Transfers Overall transfer level: Needs assistance Equipment used: Rolling walker (2 wheels) Transfers: Sit to/from Stand, Bed to chair/wheelchair/BSC Sit to Stand: Supervision, Contact guard assist Stand pivot transfers: Supervision, Contact guard assist              Ambulation/Gait Ambulation/Gait assistance: Supervision, Contact guard assist Gait Distance (Feet): 25 Feet Assistive device: Rolling walker (2 wheels)         General Gait Details: mild forward flexed posture, decreased step height/length (mild steppage gait pattern L > R), decreased cadence; however, generally safe and stable, feels confident in performance and feels it near baseline for her "as long as I have the walker to hold to". Minimal SOB with gait efforts, sats >92% on 2L [place]  Stairs            Wheelchair Mobility     Tilt Bed    Modified Rankin (Stroke Patients Only)       Balance Overall balance assessment: Needs assistance Sitting-balance support: No upper extremity supported, Feet supported Sitting balance-[name]: Good     Standing balance support: Bilateral upper extremity supported Standing balance-[name]: Fair                               Pertinent Vitals/Pain Pain Assessment Pain Assessment: No/denies pain    Home Living Family/patient expects to be discharged to:: Private residence Living Arrangements: Alone Available Help at Discharge: Family;Available  24 hours/day   Home Access: Level entry       Home Layout: One level Home Equipment: Rollator (4 wheels);Cane - single point;Grab bars - tub/shower;Grab bars - toilet;Lift chair;Transport chair;Tub bench;Rolling Walker (2 wheels);Shower seat Additional Comments: Senior living apartment with granddaughter able to stay with patient 24/7 as needed, has call bells in multiple rooms for help if needed.  Also reports  daughter (from [place]) planning to arrive in town on Tuesday (3/25) and planning to stay with her a while    Prior Function Prior Level of Function : Needs assist             Mobility Comments: Mod Ind amb in her senior living apt with a rollator, family assists with transport chair for community access, no falls in the last 6 months with last fall being in July of 2024.  Denies home O2 ADLs Comments: Seated shower, mod indep with dressing, indep with meals, meds. Family assists with groceries, driving, trash, and mail     Extremity/Trunk Assessment   Upper Extremity Assessment Upper Extremity Assessment: Overall WFL for tasks assessed    Lower Extremity Assessment Lower Extremity Assessment: Generalized weakness (grossly at least 4-/5 throughout)       Communication        Cognition Arousal: Alert Behavior During Therapy: [facility] for tasks assessed/performed   PT - Cognitive impairments: No apparent impairments                                 Cueing       General Comments      Exercises     Assessment/Plan    PT Assessment Patient needs continued PT services  PT Problem List Decreased strength;Decreased range of motion;Decreased balance;Cardiopulmonary status limiting activity;Decreased coordination;Decreased mobility;Decreased knowledge of precautions;Decreased safety awareness;Decreased activity tolerance;Decreased knowledge of use of DME       PT Treatment Interventions DME instruction;Gait training;Functional mobility training;Therapeutic activities;Therapeutic exercise;Balance training;Patient/family education    PT Goals (Current goals can be found in the Care Plan section)  Acute Rehab PT Goals Patient Stated Goal: to return home PT Goal Formulation: With patient Time For Goal Achievement: 10/26/23 Potential to Achieve Goals: Good    Frequency Min 2X/week     Co-evaluation               AM-PAC PT "6 Clicks" Mobility  Outcome  Measure Help needed turning from your back to your side while in a flat bed without using bedrails?: None Help needed moving from lying on your back to sitting on the side of a flat bed without using bedrails?: None Help needed moving to and from a bed to a chair (including a wheelchair)?: A Little Help needed standing up from a chair using your arms (e.g., wheelchair or bedside chair)?: A Little Help needed to walk in hospital room?: A Little Help needed climbing 3-5 steps with a railing? : A Little 6 Click Score: 20    End of Session Equipment Utilized During Treatment: Gait belt Activity Tolerance: Patient tolerated treatment well Patient left: in chair;with call bell/phone within reach;with chair alarm set Nurse Communication: Mobility status PT Visit Diagnosis: Muscle weakness (generalized) (M62.81);Difficulty in walking, not elsewhere classified (R26.2)    Time: 1610-9604 PT Time Calculation (min) (ACUTE ONLY): 20 min   Charges:   PT Evaluation $PT Eval Moderate Complexity: 1 Mod   PT General Charges $$ ACUTE PT VISIT: 1 Visit  Lestat Golob H. Manson Passey, PT, DPT, NCS 10/12/23, 11:54 AM [phone]

## 2023-10-12 NOTE — Assessment & Plan Note (Addendum)
 Continue Eliquis, amiodarone and Coreg.

## 2023-10-12 NOTE — Assessment & Plan Note (Signed)
 Blood pressure mildly elevated but significantly improved from admission. -Continue Coreg, hydralazine, Imdur and torsemide

## 2023-10-12 NOTE — Assessment & Plan Note (Signed)
 Creatinine remains stable within baseline although slightly increased from admission. -Monitor renal function -Avoid nephrotoxins

## 2023-10-12 NOTE — Hospital Course (Addendum)
 Taken from H&P.  Leslie Parker is a 89 y.o. female with medical history significant of chronic HFrEF with LVEF 25 to 30%, COPD Gold stage III, SSS status post PPM, PAF on Eliquis, CKD stage IV, HTN, IIDM, presented with sudden onset of shortness of breath.   On presentation blood pressure was significantly elevated at 198/100, saturating 90% on room air but started on BiPAP due to increased work of breathing. Chest x-ray with pulmonary congestion and worsening of bilateral pleural effusion.  Labs with creatinine of 1.9(baseline appears to be around 2.2),  ABG 7.41/42/102.   Patient was started on IV diuresis.  On chart review patient was advised palliative care/hospice during previous heart breath hospitalization but she stated adamant to remain full code and full scope of care and would not engage in further discussion.  Fifth hospitalization since the start of 2025.  3/22: Vital stable, transition to 3 L of oxygen.  Labs with slight increase in creatinine to 2.05 and BUN of 42.  Discontinuing further IV Lasix and starting on low-dose p.o. torsemide from tomorrow. Palliative care was again consulted-hoping she can follow-up with them as outpatient and they can control her symptoms to prevent recurrent hospitalizations.  3/23: Hemodynamically stable, now on room air.  Worsening creatinine so holding torsemide today.  Patient need palliative care.  Mild hyponatremia. Patient does not have anyone who can transport her back to her assisted living today.  3/24: Patient remained hemodynamically stable, renal function started improving after holding diuretic.  Holding diuretic today and then she was started on low-dose torsemide at 10 mg daily.  A home health RN help will be appreciated to make sure that she takes her medications appropriately. Outpatient referral for palliative care was also provided as patient was suggested to be hospice by cardiology although she still wants to remain full scope of  care.  Multiple hospitalizations and advanced heart failure with other significant comorbidities.  Patient is very difficult to diurese as she started getting worsening in creatinine pretty quickly.  Clinically appears euvolemic on discharge.  She will continue rest of her home medications.  Home as needed Lasix is being switched with torsemide 10 mg daily.  Patient need to follow-up with her providers closely for further recommendations.

## 2023-10-12 NOTE — Assessment & Plan Note (Signed)
 Acute hypoxic respiratory failure. EF of 25 to 30% according to the echo done in January 2025, BNP significantly elevated about 4500 which is the case for multiple prior hospitalizations.  Clinically does not appear much volume overload.  Still requiring 2 L of oxygen, per patient she does not use oxygen at baseline.  Also has an history of COPD Gold stage IV.  Both can be contributory. Patient was advised to be hospice by heart failure team but she declined and wants to stay full code with full scope of care. Slight increase in creatinine although remained within baseline with IV Lasix. -Palliative care was reconsulted -Switching IV Lasix with torsemide from tomorrow -Daily weight and BMP -Strict intake and output

## 2023-10-13 DIAGNOSIS — N184 Chronic kidney disease, stage 4 (severe): Secondary | ICD-10-CM | POA: Diagnosis not present

## 2023-10-13 DIAGNOSIS — J449 Chronic obstructive pulmonary disease, unspecified: Secondary | ICD-10-CM | POA: Diagnosis not present

## 2023-10-13 DIAGNOSIS — Z515 Encounter for palliative care: Secondary | ICD-10-CM | POA: Diagnosis not present

## 2023-10-13 DIAGNOSIS — I5043 Acute on chronic combined systolic (congestive) and diastolic (congestive) heart failure: Secondary | ICD-10-CM | POA: Diagnosis not present

## 2023-10-13 DIAGNOSIS — I480 Paroxysmal atrial fibrillation: Secondary | ICD-10-CM | POA: Diagnosis not present

## 2023-10-13 DIAGNOSIS — I10 Essential (primary) hypertension: Secondary | ICD-10-CM | POA: Diagnosis not present

## 2023-10-13 LAB — BASIC METABOLIC PANEL
Anion gap: 8 (ref 5–15)
BUN: 52 mg/dL — ABNORMAL HIGH (ref 8–23)
CO2: 25 mmol/L (ref 22–32)
Calcium: 7.6 mg/dL — ABNORMAL LOW (ref 8.9–10.3)
Chloride: 98 mmol/L (ref 98–111)
Creatinine, Ser: 2.48 mg/dL — ABNORMAL HIGH (ref 0.44–1.00)
GFR, Estimated: 18 mL/min — ABNORMAL LOW (ref >60)
Glucose, Bld: 150 mg/dL — ABNORMAL HIGH (ref 70–99)
Potassium: 4.1 mmol/L (ref 3.5–5.1)
Sodium: 131 mmol/L — ABNORMAL LOW (ref 135–145)

## 2023-10-13 NOTE — Plan of Care (Signed)
  Problem: Safety: Goal: Ability to remain free from injury will improve Outcome: Progressing   Problem: Pain Managment: Goal: General experience of comfort will improve and/or be controlled Outcome: Progressing   Problem: Nutrition: Goal: Adequate nutrition will be maintained Outcome: Progressing   Problem: Clinical Measurements: Goal: Ability to maintain clinical measurements within normal limits will improve Outcome: Progressing Goal: Will remain free from infection Outcome: Progressing   Problem: Activity: Goal: Risk for activity intolerance will decrease Outcome: Progressing

## 2023-10-13 NOTE — Progress Notes (Signed)
 Progress Note   Patient: Leslie Parker ZOX:[id number] DOB: 1933/04/13 DOA: 10/11/2023     2 DOS: the patient was seen and examined on 10/13/2023   Brief hospital course: Taken from H&P.  Leslie Parker is a 89 y.o. female with medical history significant of chronic HFrEF with LVEF 25 to 30%, COPD Gold stage III, SSS status post PPM, PAF on Eliquis, CKD stage IV, HTN, IIDM, presented with sudden onset of shortness of breath.   On presentation blood pressure was significantly elevated at 198/100, saturating 90% on room air but started on BiPAP due to increased work of breathing. Chest x-ray with pulmonary congestion and worsening of bilateral pleural effusion.  Labs with creatinine of 1.9(baseline appears to be around 2.2),  ABG 7.41/42/102.   Patient was started on IV diuresis.  On chart review patient was advised palliative care/hospice during previous heart breath hospitalization but she stated adamant to remain full code and full scope of care and would not engage in further discussion.  Fifth hospitalization since the start of 2025.  3/22: Vital stable, transition to 3 L of oxygen.  Labs with slight increase in creatinine to 2.05 and BUN of 42.  Discontinuing further IV Lasix and starting on low-dose p.o. torsemide from tomorrow. Palliative care was again consulted-hoping she can follow-up with them as outpatient and they can control her symptoms to prevent recurrent hospitalizations.  3/23: Hemodynamically stable, now on room air.  Worsening creatinine so holding torsemide today.  Patient need palliative care.  Mild hyponatremia. Patient does not have anyone who can transport her back to her assisted living today.   Assessment and Plan: * Acute on chronic combined systolic and diastolic CHF (congestive heart failure) (HCC) Acute hypoxic respiratory failure. EF of 25 to 30% according to the echo done in January 2025, BNP significantly elevated about 4500 which is the case for multiple  prior hospitalizations.  Clinically does not appear much volume overload.  Able to wean back to room air.  Also has an history of COPD Gold stage IV.  Both can be contributory. Patient was advised to be hospice by heart failure team but she declined and wants to stay full code with full scope of care. Slight increase in creatinine although remained within baseline with IV Lasix. -Palliative care was reconsulted -Holding torsemide today-might need every other day instead of daily -Daily weight and BMP -Strict intake and output   HTN (hypertension), malignant Blood pressure within goal. -Continue Coreg, hydralazine, Imdur and torsemide  Paroxysmal atrial fibrillation (HCC) -Continue Eliquis, amiodarone and Coreg  COPD (chronic obstructive pulmonary disease) (HCC) History of COPD Gold stage IV. No wheezing, but might be contributory to shortness of breath and might be becoming oxygen dependent. -As needed bronchodilator  CKD (chronic kidney disease), stage IV (HCC) Creatinine with another small increase so holding more diuretic. -Monitor renal function -Avoid nephrotoxins  Sick sinus syndrome [facility]) S/p pacemaker placement. No acute concern -Continue with outpatient follow-up      Subjective: Patient was seen and examined today.  Denies any more shortness of breath.  Per patient her granddaughter will be available tomorrow to pick her up.  Physical Exam: Vitals:   10/13/23 0347 10/13/23 0511 10/13/23 0748 10/13/23 1127  BP: 113/66  135/65 133/66  Pulse: (!) 58  60 61  Resp: 18  19 18   Temp: 97.7 F (36.5 C)  98.8 F (37.1 C) 98.6 F (37 C)  TempSrc:      SpO2: 92%  93% 91%  Weight:  77.1 kg     General.  Frail elderly lady, in no acute distress. Pulmonary.  Lungs clear bilaterally, normal respiratory effort. CV.  Regular rate and rhythm, no JVD, rub or murmur. Abdomen.  Soft, nontender, nondistended, BS positive. CNS.  Alert and oriented .  No focal neurologic  deficit. Extremities.  No edema,  pulses intact and symmetrical.   Data Reviewed: Prior data reviewed  Family Communication:   Disposition: Status is: Inpatient Remains inpatient appropriate because: Severity of illness  Planned Discharge Destination: Home with Home Health  DVT prophylaxis.  Eliquis Time spent: 45 minutes  This record has been created using Conservation officer, historic buildings. Errors have been sought and corrected,but may not always be located. Such creation errors do not reflect on the standard of care.   Author: Arnetha Courser, MD 10/13/2023 2:22 PM  For on call review [email or web address].

## 2023-10-13 NOTE — Assessment & Plan Note (Signed)
 Creatinine with another small increase so holding more diuretic. -Monitor renal function -Avoid nephrotoxins

## 2023-10-13 NOTE — TOC Initial Note (Signed)
 Transition of Care [facility]) - Initial/Assessment Note    Patient Details  Name: Leslie Parker MRN: [id number] Date of Birth: [birth year]  [facility]) CM/SW Contact:    Rodney Langton, RN Phone Number: 10/13/2023, 10:11 AM  Clinical Narrative:                  Patient admitted from home, lives in senior apartment [place].  PCP is Dr. Nemiah Commander, [facility] [place].  Has DME at home (walker, wheelchair, [facility]), and also has emergency alert system within her apartment.  Aware that she has recommendations for home health PT, which she has declined in the past, continues to decline now.  State she is active with [facility], waiting on MRI results to determine if intervention is needed.  Prefer to wait until after this is done to decide on PT options.  Inquired about transportation home, state her granddaughter will take her, but she is not available until tomorrow, MD aware.  TOC will continue to follow.   Expected Discharge Plan: Home/Self Care Barriers to Discharge: Continued Medical Work up   Patient Goals and CMS Choice Patient states their goals for this hospitalization and ongoing recovery are:: Home, senior apartments          Expected Discharge Plan and Services       Living arrangements for the past 2 months: Apartment                                      Prior Living Arrangements/Services Living arrangements for the past 2 months: Apartment Lives with:: Self Patient language and need for interpreter reviewed:: Yes        Need for Family Participation in Patient Care: Yes (Comment) Care giver support system in place?: Yes (comment) Current home services: DME Criminal Activity/Legal Involvement Pertinent to Current Situation/Hospitalization: No - Comment as needed  Activities of Daily Living   ADL Screening (condition at time of admission) Independently performs ADLs?: No Does the patient have a NEW difficulty with  bathing/dressing/toileting/self-feeding that is expected to last >3 days?: No Does the patient have a NEW difficulty with getting in/out of bed, walking, or climbing stairs that is expected to last >3 days?: Yes (Initiates electronic notice to provider for possible PT consult) Does the patient have a NEW difficulty with communication that is expected to last >3 days?: No Is the patient deaf or have difficulty hearing?: No Does the patient have difficulty seeing, even when wearing glasses/contacts?: No Does the patient have difficulty concentrating, remembering, or making decisions?: No  Permission Sought/Granted                  Emotional Assessment       Orientation: : Oriented to Self, Oriented to Place, Oriented to  Time, Oriented to Situation   Psych Involvement: No (comment)  Admission diagnosis:  CHF (congestive heart failure) (HCC) [I50.9] Acute on chronic congestive heart failure, unspecified heart failure type (HCC) [I50.9] Patient Active Problem List   Diagnosis Date Noted   Malnutrition of moderate degree 09/17/2023   CHF (congestive heart failure) (HCC) 09/15/2023   Acute on chronic systolic CHF (congestive heart failure) (HCC) 08/19/2023   Pleural effusion due to CHF (congestive heart failure) (HCC) 08/19/2023   HTN (hypertension), malignant 08/19/2023   Ileus (HCC) 08/12/2023   Large bowel obstruction (HCC) 08/12/2023   CKD (chronic kidney disease), stage  IV (HCC) 08/12/2023   HFrEF (heart failure with reduced ejection fraction) (HCC) 08/12/2023   Acute on chronic combined systolic and diastolic CHF (congestive heart failure) (HCC) 08/10/2023   Hyponatremia 05/05/2023   Overweight (BMI 25.0-29.9) 05/01/2023   Left hip pain 05/01/2023   Hypokalemia 04/30/2023   Anemia 03/21/2023   Abnormal LFTs 03/20/2023   Type II diabetes mellitus with renal manifestations (HCC) 02/13/2023   Chronic diastolic CHF (congestive heart failure) (HCC) 02/13/2023   CKD stage 3 due  to type 2 diabetes mellitus (HCC) 11/24/2022   Paroxysmal atrial fibrillation (HCC) 11/24/2022   Sick sinus syndrome (HCC) 11/15/2022   Acute on chronic diastolic CHF (congestive heart failure) (HCC) 08/27/2022   COPD (chronic obstructive pulmonary disease) (HCC) 08/23/2022   Chronic a-fib (HCC) 08/22/2022   Diabetes mellitus without complication (HCC)    Stage 3b chronic kidney disease (HCC) - Baseline scr 1.8-2.0    Postural dizziness with presyncope    Hypomagnesemia    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 [place] [address] Phone: [phone] Fax: [phone]  [id number], [place] - [address] [place] [address] Phone: [phone] Fax: [phone]     Social Drivers of Health (SDOH) Social History: SDOH Screenings   Food Insecurity: No Food Insecurity (10/12/2023)  Housing: Low Risk  (10/12/2023)  Transportation Needs: No Transportation Needs (10/12/2023)  Utilities: Not At Risk (10/12/2023)  Alcohol Screen: Low Risk  (08/20/2023)  Financial Resource Strain: Low Risk  (08/20/2023)  Social Connections: Moderately Integrated (10/12/2023)  Tobacco Use: Low Risk  (10/11/2023)   SDOH Interventions:     Readmission Risk Interventions    09/16/2023    2:52 PM 08/20/2023    3:45 PM 08/16/2023   12:42 PM  Readmission Risk Prevention Plan  Transportation Screening Complete Complete Complete  Medication Review Oceanographer) Complete Complete Complete  PCP or Specialist appointment within 3-5 days of discharge Complete Complete   HRI or Home Care Consult Patient refused Complete Complete  SW Recovery Care/Counseling Consult Complete Complete Complete  Palliative Care Screening Not Applicable Not Applicable Not Applicable   Skilled Nursing Facility Not Applicable Not Applicable Not Applicable

## 2023-10-13 NOTE — Assessment & Plan Note (Signed)
 Blood pressure within goal. -Continue Coreg, hydralazine, Imdur and torsemide

## 2023-10-13 NOTE — Plan of Care (Signed)
  Problem: Clinical Measurements: Goal: Ability to maintain clinical measurements within normal limits will improve Outcome: Progressing   Problem: Clinical Measurements: Goal: Respiratory complications will improve Outcome: Progressing   Problem: Activity: Goal: Risk for activity intolerance will decrease Outcome: Progressing   Problem: Safety: Goal: Ability to remain free from injury will improve Outcome: Progressing

## 2023-10-13 NOTE — Assessment & Plan Note (Signed)
 Acute hypoxic respiratory failure. EF of 25 to 30% according to the echo done in January 2025, BNP significantly elevated about 4500 which is the case for multiple prior hospitalizations.  Clinically does not appear much volume overload.  Able to wean back to room air.  Also has an history of COPD Gold stage IV.  Both can be contributory. Patient was advised to be hospice by heart failure team but she declined and wants to stay full code with full scope of care. Slight increase in creatinine although remained within baseline with IV Lasix. -Palliative care was reconsulted -Holding torsemide today-might need every other day instead of daily -Daily weight and BMP -Strict intake and output

## 2023-10-13 NOTE — Consult Note (Signed)
 Consultation Note Date: 10/13/2023   Patient Name: Leslie Parker  DOB: Aug 14, 1932  MRN: [id number]  Age / Sex: 89 y.o., female  PCP: [name], MD Referring Physician: Arnetha Courser, MD  Reason for Consultation: Establishing goals of care   HPI/Brief Hospital Course: 89 y.o. female  with past medical history of HFrEF with LVEF 25-30%, COPD, SSS s/p PPM, PAF on Eliquis, CKD stage IV, HTN and DM2 admitted from independently living facility on 10/11/2023 with shortness of breath.  Noted 8 IP admits in last 6 months   Familiar to PMT as she has been followed during previous hospitalizations  Palliative medicine was consulted for assisting with goals of care conversations.  Subjective:  Extensive chart review has been completed prior to meeting patient including labs, vital signs, imaging, progress notes, orders, and available advanced directive documents from current and previous encounters.  Visited with Leslie Parker at her bedside. She is awake, alert and able to engage in conversation.  Introduced myself as a Publishing rights manager as a member of the palliative care team. Explained palliative medicine is specialized medical care for people living with serious illness. It focuses on providing relief from the symptoms and stress of a serious illness. The goal is to improve quality of life for both the patient and the family.   Leslie Parker able to recall previous visits with PMT.  Leslie Parker shares she lives in an apartment building within an independent living community in [place]. She enjoys her home and her neighbors. She is able to function independently, uses a walker for ambulation driving, granddaughter transports her to appointments. Confirms granddaughter serves as Product manager.  Leslie Parker shares her understanding of current plan and reason for hospitalization. She shares she is well aware of the severity of her underlying heart disease and other comorbid  conditions.  Attempted to elicit goals of care. Leslie Parker shares she has a good understanding on the difference between Full Code and Do Not Resuscitate. She shares she is confused why her code status is addressed every admission. She shares she has always been clear she wishes to remain Full Code. Attempted to explain importance of ongoing and continued goals of care conversations due to the severity of her heart disease and other comorbid conditions.  We discussed the role of outpatient palliative care for which she agrees to referral being placed.  We discussed patient's current illness and what it means in the larger context of patient's on-going co-morbidities. Natural disease trajectory and expectations at EOL were discussed.   Leslie Parker feels she will be able to better manage herself at home as she know understands the importance of daily weights after discussions with her cardiology team.  I discussed importance of continued conversations with family/support persons and all members of their medical team regarding overall plan of care and treatment options ensuring decisions are in alignment with patients goals of care.  All questions/concerns addressed. Emotional support provided to patient/family/support persons.   PMT will follow peripherally as goals are clear, provided Leslie Parker with PMT contact information, please reengage if needs or concerns arise.  Objective: Primary Diagnoses: Present on Admission:  Acute on chronic combined systolic and diastolic CHF (congestive heart failure) (HCC)  HTN (hypertension), malignant  Paroxysmal atrial fibrillation (HCC)  CKD (chronic kidney disease), stage IV (HCC)  COPD (chronic obstructive pulmonary disease) (HCC)  Sick sinus syndrome (HCC)   Physical Exam Constitutional:      General: She is not in acute distress.  Appearance: She is ill-appearing.  Pulmonary:     Effort: Pulmonary effort is normal. No respiratory distress.   Skin:    General: Skin is warm and dry.  Neurological:     Mental Status: She is alert and oriented to person, place, and time.     Motor: Weakness present.  Psychiatric:        Mood and Affect: Mood normal.        Behavior: Behavior normal.        Thought Content: Thought content normal.     Vital Signs: BP 134/67 (BP Location: Left Arm)   Pulse 60   Temp 98.6 F (37 C)   Resp 18   Wt 77.1 kg   SpO2 94%   BMI 25.10 kg/m  Pain Scale: 0-10   Pain Score: 0-No pain  IO: Intake/output summary:  Intake/Output Summary (Last 24 hours) at 10/13/2023 1716 Last data filed at 10/13/2023 1420 Gross per 24 hour  Intake 550 ml  Output 250 ml  Net 300 ml    LBM: Last BM Date :  (PTA) Baseline Weight: Weight: 77.1 kg Most recent weight: Weight: 77.1 kg      Assessment and Plan  SUMMARY OF RECOMMENDATIONS   Outpatient palliative care-order placed to [facility] to place referral at discharge  Thank you for this consult and allowing Palliative Medicine to participate in the care of Leslie Parker. Palliative medicine will continue to follow and assist as needed.   Time Total: 75 minutes  Time spent includes: Detailed review of medical records (labs, imaging, vital signs), medically appropriate exam (mental status, respiratory, cardiac, skin), discussed with treatment team, counseling and educating patient, family and staff, documenting clinical information, medication management and coordination of care.   Signed by: Leeanne Deed, DNP, AGNP-C Palliative Medicine    Please contact Palliative Medicine Team phone at [phone] for questions and concerns.  For individual provider: See Loretha Stapler

## 2023-10-14 ENCOUNTER — Other Ambulatory Visit: Payer: Self-pay

## 2023-10-14 ENCOUNTER — Other Ambulatory Visit: Payer: Self-pay | Admitting: Orthopedic Surgery

## 2023-10-14 DIAGNOSIS — I5043 Acute on chronic combined systolic (congestive) and diastolic (congestive) heart failure: Secondary | ICD-10-CM | POA: Diagnosis not present

## 2023-10-14 DIAGNOSIS — M48062 Spinal stenosis, lumbar region with neurogenic claudication: Secondary | ICD-10-CM

## 2023-10-14 DIAGNOSIS — E440 Moderate protein-calorie malnutrition: Secondary | ICD-10-CM

## 2023-10-14 DIAGNOSIS — I509 Heart failure, unspecified: Secondary | ICD-10-CM | POA: Diagnosis not present

## 2023-10-14 DIAGNOSIS — N184 Chronic kidney disease, stage 4 (severe): Secondary | ICD-10-CM | POA: Diagnosis not present

## 2023-10-14 LAB — BASIC METABOLIC PANEL
Anion gap: 9 (ref 5–15)
BUN: 55 mg/dL — ABNORMAL HIGH (ref 8–23)
CO2: 25 mmol/L (ref 22–32)
Calcium: 8.1 mg/dL — ABNORMAL LOW (ref 8.9–10.3)
Chloride: 99 mmol/L (ref 98–111)
Creatinine, Ser: 2.33 mg/dL — ABNORMAL HIGH (ref 0.44–1.00)
GFR, Estimated: 20 mL/min — ABNORMAL LOW (ref >60)
Glucose, Bld: 126 mg/dL — ABNORMAL HIGH (ref 70–99)
Potassium: 3.8 mmol/L (ref 3.5–5.1)
Sodium: 133 mmol/L — ABNORMAL LOW (ref 135–145)

## 2023-10-14 MED ORDER — TORSEMIDE 10 MG PO TABS
10.0000 mg | ORAL_TABLET | Freq: Every day | ORAL | 2 refills | Status: DC
  Filled 2023-10-14: qty 30, 30d supply, fill #0

## 2023-10-14 NOTE — Discharge Summary (Signed)
 Physician Discharge Summary   Patient: Leslie Parker MRN: [id number] DOB: Apr 14, 1933  Admit date:     10/11/2023  Discharge date: 10/14/23  Discharge Physician: Arnetha Courser   PCP: [name], MD   Recommendations at discharge:  Please obtain CBC and BMP on follow-up  Please encourage to engage with palliative care as she is appropriate for hospice at her place.  She does not want to be hospice at this time. Monitor volume status closely Follow-up with primary care provider within a week Follow-up with cardiology  Discharge Diagnoses: Principal Problem:   Acute on chronic combined systolic and diastolic CHF (congestive heart failure) (HCC) Active Problems:   HTN (hypertension), malignant   Paroxysmal atrial fibrillation (HCC)   COPD (chronic obstructive pulmonary disease) (HCC)   CKD (chronic kidney disease), stage IV (HCC)   Sick sinus syndrome (HCC)   CHF (congestive heart failure) [facility])   Hospital Course: Taken from H&P.  Leslie Parker is a 89 y.o. female with medical history significant of chronic HFrEF with LVEF 25 to 30%, COPD Gold stage III, SSS status post PPM, PAF on Eliquis, CKD stage IV, HTN, IIDM, presented with sudden onset of shortness of breath.   On presentation blood pressure was significantly elevated at 198/100, saturating 90% on room air but started on BiPAP due to increased work of breathing. Chest x-ray with pulmonary congestion and worsening of bilateral pleural effusion.  Labs with creatinine of 1.9(baseline appears to be around 2.2),  ABG 7.41/42/102.   Patient was started on IV diuresis.  On chart review patient was advised palliative care/hospice during previous heart breath hospitalization but she stated adamant to remain full code and full scope of care and would not engage in further discussion.  Fifth hospitalization since the start of 2025.  3/22: Vital stable, transition to 3 L of oxygen.  Labs with slight increase in creatinine to 2.05  and BUN of 42.  Discontinuing further IV Lasix and starting on low-dose p.o. torsemide from tomorrow. Palliative care was again consulted-hoping she can follow-up with them as outpatient and they can control her symptoms to prevent recurrent hospitalizations.  3/23: Hemodynamically stable, now on room air.  Worsening creatinine so holding torsemide today.  Patient need palliative care.  Mild hyponatremia. Patient does not have anyone who can transport her back to her assisted living today.  3/24: Patient remained hemodynamically stable, renal function started improving after holding diuretic.  Holding diuretic today and then she was started on low-dose torsemide at 10 mg daily.  A home health RN help will be appreciated to make sure that she takes her medications appropriately. Outpatient referral for palliative care was also provided as patient was suggested to be hospice by cardiology although she still wants to remain full scope of care.  Multiple hospitalizations and advanced heart failure with other significant comorbidities.  Patient is very difficult to diurese as she started getting worsening in creatinine pretty quickly.  Clinically appears euvolemic on discharge.  She will continue rest of her home medications.  Home as needed Lasix is being switched with torsemide 10 mg daily.  Patient need to follow-up with her providers closely for further recommendations.  Assessment and Plan: * Acute on chronic combined systolic and diastolic CHF (congestive heart failure) (HCC) Acute hypoxic respiratory failure. EF of 25 to 30% according to the echo done in January 2025, BNP significantly elevated about 4500 which is the case for multiple prior hospitalizations.  Clinically does not appear much volume overload.  Able to wean back to room air.  Also has an history of COPD Gold stage IV.  Both can be contributory. Patient was advised to be hospice by heart failure team but she declined and wants to  stay full code with full scope of care. Slight increase in creatinine although remained within baseline with IV Lasix. -Palliative care was reconsulted -Holding torsemide today-patient will resume at low-dose of 10 mg daily at home. -Daily weight and BMP -Strict intake and output   HTN (hypertension), malignant Blood pressure within goal. -Continue Coreg, hydralazine, Imdur and torsemide  Paroxysmal atrial fibrillation (HCC) -Continue Eliquis, amiodarone and Coreg  COPD (chronic obstructive pulmonary disease) (HCC) History of COPD Gold stage IV. No wheezing, but might be contributory to shortness of breath and might be becoming oxygen dependent. -As needed bronchodilator  CKD (chronic kidney disease), stage IV (HCC) Creatinine started improving after holding diuretic. -Monitor renal function -Avoid nephrotoxins  Sick sinus syndrome [facility]) S/p pacemaker placement. No acute concern -Continue with outpatient follow-up      Consultants: None Procedures performed: None Disposition: Assisted living Diet recommendation:  Discharge Diet Orders (From admission, onward)     Start     Ordered   10/14/23 0000  Diet - low sodium heart healthy        10/14/23 1030           Cardiac diet DISCHARGE MEDICATION: Allergies as of 10/14/2023       Reactions   Baclofen Other (See Comments)   Elevated Cr   Simvastatin Other (See Comments)   Pt denies Elevated LFTs with simva 40mg , stopped and resolved (see MD note 11/10/13)        Medication List     STOP taking these medications    furosemide 20 MG tablet Commonly known as: LASIX       TAKE these medications    acetaminophen 325 MG tablet Commonly known as: TYLENOL Take 2 tablets (650 mg total) by mouth every 4 (four) hours as needed for headache or mild pain (pain score 1-3).   albuterol 108 (90 Base) MCG/ACT inhaler Commonly known as: VENTOLIN HFA Inhale 2 puffs into the lungs every 6 (six) hours as needed.    amiodarone 200 MG tablet Commonly known as: PACERONE Take 1 tablet by mouth daily.   apixaban 2.5 MG Tabs tablet Commonly known as: ELIQUIS Take 1 tablet (2.5 mg total) by mouth 2 (two) times daily.   ascorbic acid 250 MG Chew Commonly known as: VITAMIN C Chew 250 mg by mouth daily.   B-12 2500 MCG Tabs Take 2,500 mcg by mouth daily.   carvedilol 6.25 MG tablet Commonly known as: COREG Take 1 tablet (6.25 mg total) by mouth 2 (two) times daily with a meal.   colestipol 1 g tablet Commonly known as: COLESTID Take 1 tablet by mouth 2 (two) times daily.   glipiZIDE 2.5 MG 24 hr tablet Commonly known as: GLUCOTROL XL Take 2.5 mg by mouth daily.   hydrALAZINE 50 MG tablet Commonly known as: APRESOLINE Take 1 tablet (50 mg total) by mouth 3 (three) times daily.   ipratropium-albuterol 0.5-2.5 (3) MG/3ML Soln Commonly known as: DUONEB Inhale 3 mLs into the lungs every 6 (six) hours as needed.   isosorbide mononitrate 30 MG 24 hr tablet Commonly known as: IMDUR Take 1 tablet (30 mg total) by mouth at bedtime.   Magnesium Oxide -Mg Supplement 500 MG Caps Take 500 mg by mouth daily.   multivitamin with minerals Tabs  tablet Take 1 tablet by mouth daily. One-A-Day Women's Vitamin   omeprazole 20 MG capsule Commonly known as: PRILOSEC Take 20 mg by mouth every morning.   torsemide 10 MG tablet Commonly known as: DEMADEX Take 1 tablet (10 mg total) by mouth daily.   traZODone 100 MG tablet Commonly known as: DESYREL Take 100 mg by mouth at bedtime.   Trelegy Ellipta 100-62.5-25 MCG/ACT Aepb Generic drug: Fluticasone-Umeclidin-Vilant Inhale 1 puff into the lungs daily.   Vitamin D-1000 Max St 25 MCG (1000 UT) tablet Generic drug: Cholecalciferol Take 1,000 Units by mouth daily.        Follow-up Information     [name], MD. Schedule an appointment as soon as possible for a visit in 1 week(s).   Specialty: Internal Medicine Contact  information: [address] [place] [address] [phone]                Discharge Exam: Ceasar Mons Weights   10/13/23 2130 10/14/23 0450  Weight: 77.1 kg 77.5 kg   General.  Frail and malnourished elderly lady, in no acute distress. Pulmonary.  Lungs clear bilaterally, normal respiratory effort. CV.  Regular rate and rhythm, no JVD, rub or murmur. Abdomen.  Soft, nontender, nondistended, BS positive. CNS.  Alert and oriented .  No focal neurologic deficit. Extremities.  No edema, no cyanosis, pulses intact and symmetrical.  Condition at discharge: stable  The results of significant diagnostics from this hospitalization (including imaging, microbiology, ancillary and laboratory) are listed below for reference.   Imaging Studies: DG Chest 1 View Result Date: 10/12/2023 CLINICAL DATA:  CHF. EXAM: CHEST  1 VIEW COMPARISON:  10/11/2023 FINDINGS: The cardio pericardial silhouette is enlarged. Bibasilar atelectasis or infiltrate is progressive in the interval with small to moderate left and small right pleural effusions stable to minimally progressive since prior. Left-sided permanent pacemaker noted. Bones diffusely demineralized. Status post right shoulder replacement. IMPRESSION: Progressive bibasilar atelectasis or infiltrate with small to moderate left and small right pleural effusions. Electronically Signed   By: Kennith Center M.D.   On: 10/12/2023 06:57   DG Chest Portable 1 View Result Date: 10/11/2023 CLINICAL DATA:  Shortness of breath. EXAM: PORTABLE CHEST 1 VIEW COMPARISON:  09/15/2023. FINDINGS: Low lung volume. Moderate diffuse pulmonary vascular congestion and bilateral small layering pleural effusions. There are additional left retrocardiac opacities, which are similar to the prior study and may represent left lung atelectasis and/or consolidation. Bilateral lung fields are otherwise clear. No pneumothorax. Stable mildly enlarged cardio-mediastinal silhouette. There  is a left sided 2-lead pacemaker. No acute osseous abnormalities. Right reverse shoulder arthroplasty noted. The soft tissues are within normal limits. IMPRESSION: Findings favor congestive heart failure/pulmonary edema. Electronically Signed   By: Jules Schick M.D.   On: 10/11/2023 10:46   DG Chest Portable 1 View Result Date: 09/15/2023 CLINICAL DATA:  Shortness of breath and weakness. EXAM: PORTABLE CHEST 1 VIEW COMPARISON:  Portable chest 08/10/2023. FINDINGS: Left chest dual lead pacing system with stable wire insertions. Stable cardiomegaly. There is central vascular prominence, interstitial edema in the lower lung fields and small-to-moderate layering pleural effusions. Findings are compatible with CHF or fluid overload and are similar to the prior study. There are opacities in both lower lung fields which could indicate atelectasis, airspace edema or pneumonia. The mid and upper lung fields remain clear with COPD change. Stable mediastinum.  The aorta is tortuous and calcified. Right shoulder reverse arthroplasty with no new osseous findings. IMPRESSION: 1. Findings compatible with CHF  or fluid overload, similar to the prior study. 2. Opacities in both lower lung fields which could indicate atelectasis, airspace edema or pneumonia. 3. Progress chest films recommended depending on clinical response. 4. COPD. 5. Aortic atherosclerosis. Electronically Signed   By: Almira Bar M.D.   On: 09/15/2023 07:39    Microbiology: Results for orders placed or performed during the hospital encounter of 10/11/23  Resp panel by RT-PCR (RSV, Flu A&B, Covid) Anterior Nasal Swab     Status: None   Collection Time: 10/11/23 10:26 AM   Specimen: Anterior Nasal Swab  Result Value Ref Range Status   SARS Coronavirus 2 by RT PCR NEGATIVE NEGATIVE Final    Comment: (NOTE) SARS-CoV-2 target nucleic acids are NOT DETECTED.  The SARS-CoV-2 RNA is generally detectable in upper respiratory specimens during the acute  phase of infection. The lowest concentration of SARS-CoV-2 viral copies this assay can detect is 138 copies/mL. A negative result does not preclude SARS-Cov-2 infection and should not be used as the sole basis for treatment or other patient management decisions. A negative result may occur with  improper specimen collection/handling, submission of specimen other than nasopharyngeal swab, presence of viral mutation(s) within the areas targeted by this assay, and inadequate number of viral copies(<138 copies/mL). A negative result must be combined with clinical observations, patient history, and epidemiological information. The expected result is Negative.  Fact Sheet for Patients:  BloggerCourse.com  Fact Sheet for Healthcare Providers:  SeriousBroker.it  This test is no t yet approved or cleared by the [place] FDA and  has been authorized for detection and/or diagnosis of SARS-CoV-2 by FDA under an Emergency Use Authorization (EUA). This EUA will remain  in effect (meaning this test can be used) for the duration of the COVID-19 declaration under Section 564(b)(1) of the Act, 21 U.S.C.section 360bbb-3(b)(1), unless the authorization is terminated  or revoked sooner.       Influenza A by PCR NEGATIVE NEGATIVE Final   Influenza B by PCR NEGATIVE NEGATIVE Final    Comment: (NOTE) The Xpert Xpress SARS-CoV-2/FLU/RSV plus assay is intended as an aid in the diagnosis of influenza from Nasopharyngeal swab specimens and should not be used as a sole basis for treatment. Nasal washings and aspirates are unacceptable for Xpert Xpress SARS-CoV-2/FLU/RSV testing.  Fact Sheet for Patients: BloggerCourse.com  Fact Sheet for Healthcare Providers: SeriousBroker.it  This test is not yet approved or cleared by the [place] FDA and has been authorized for detection and/or diagnosis of  SARS-CoV-2 by FDA under an Emergency Use Authorization (EUA). This EUA will remain in effect (meaning this test can be used) for the duration of the COVID-19 declaration under Section 564(b)(1) of the Act, 21 U.S.C. section 360bbb-3(b)(1), unless the authorization is terminated or revoked.     Resp Syncytial Virus by PCR NEGATIVE NEGATIVE Final    Comment: (NOTE) Fact Sheet for Patients: BloggerCourse.com  Fact Sheet for Healthcare Providers: SeriousBroker.it  This test is not yet approved or cleared by the [place] FDA and has been authorized for detection and/or diagnosis of SARS-CoV-2 by FDA under an Emergency Use Authorization (EUA). This EUA will remain in effect (meaning this test can be used) for the duration of the COVID-19 declaration under Section 564(b)(1) of the Act, 21 U.S.C. section 360bbb-3(b)(1), unless the authorization is terminated or revoked.  Performed at [facility], [address] [place], [place] [address]     Labs: CBC: Recent Labs  Lab 10/11/23 1026  WBC 5.7  NEUTROABS 3.2  HGB 12.7  HCT 37.0  MCV 91.8  PLT 276   Basic Metabolic Panel: Recent Labs  Lab 10/11/23 1026 10/12/23 0437 10/13/23 0411 10/14/23 0457  NA 135 134* 131* 133*  K 3.9 4.0 4.1 3.8  CL 99 100 98 99  CO2 24 26 25 25   GLUCOSE 148* 163* 150* 126*  BUN 39* 42* 52* 55*  CREATININE 1.95* 2.05* 2.48* 2.33*  CALCIUM 9.0 8.3* 7.6* 8.1*   Liver Function Tests: Recent Labs  Lab 10/11/23 1026  AST 44*  ALT 35  ALKPHOS 92  BILITOT 1.0  PROT 6.5  ALBUMIN 3.4*   CBG: No results for input(s): "GLUCAP" in the last 168 hours.  Discharge time spent: greater than 30 minutes.  This record has been created using Conservation officer, historic buildings. Errors have been sought and corrected,but may not always be located. Such creation errors do not reflect on the standard of care.   Signed: Arnetha Courser,  MD [facility] 10/14/2023

## 2023-10-14 NOTE — Progress Notes (Signed)
 Per patient, ride has gotten delayed but is still on the way. Will continue to monitor.

## 2023-10-14 NOTE — Progress Notes (Signed)
 PT Cancellation Note  Patient Details Name: Leslie Parker MRN: [id number] DOB: [birth year]   Cancelled Treatment:    Reason Eval/Treat Not Completed: Other (comment).  Chart reviewed and attempted to see pt.  Pt noting no needs currently and is getting ready for discharge later today.  All needs met at this time and will sign off on therapy.  Please re-consult as needed.   Nolon Bussing, PT, DPT Physical Therapist - [facility]  10/14/23, 12:14 PM

## 2023-10-14 NOTE — Care Management Important Message (Signed)
 Important Message  Patient Details  Name: Leslie Parker MRN: [id number] Date of Birth: [birth year]   Important Message Given:  Yes - Medicare IM     Cristela Blue, CMA 10/14/2023, 12:51 PM

## 2023-10-14 NOTE — Consult Note (Signed)
 [facility] Liaison Consult Note   10/14/2023  RAINELLE SULEWSKI April 16, 1933 [id number]  Primary Care Provider:  [name], MD [facility])  Patient is currently active with Care Management for chronic disease management services.  Patient has been engaged by a community RNCM.  Our community based plan of care has focused on disease management and community resource support.   Patient will receive a post hospital call and will be evaluated for assessments and disease process education.   Plan: Discharged home today and continue therapy at the outpatient clinic as previously engaged.   Inpatient Transition Of Care [TOC] team member to make aware that Care Management following.  Of note, Care Management services does not replace or interfere with any services that are needed or arranged by inpatient [facility] care management team.   For additional questions or referrals please contact:    Elliot Cousin, RN, BSN Hospital Liaison [facility]   [facility], Population Health Office Hours MTWF  8:00 am-6:00 pm Direct Dial: [phone] mobile [email or web address]

## 2023-10-14 NOTE — TOC Transition Note (Signed)
 Transition of Care [facility]) - Discharge Note   Patient Details  Name: Leslie Parker MRN: [id number] Date of Birth: 01-09-1933  Transition [facility]) CM/SW Contact:  Truddie Hidden, RN Phone Number: 10/14/2023, 11:15 AM   Clinical Narrative:    Spoke with patient and her granddaughter, [name]. Patient stated her other granddaughter, [name] will transport her home today. Patient stated she will continue to [facility].[name] will transport patient home around 1pm.  TOC signing off.       Barriers to Discharge: Continued Medical Work up   Patient Goals and CMS Choice Patient states their goals for this hospitalization and ongoing recovery are:: Home, senior apartments          Discharge Placement                       Discharge Plan and Services Additional resources added to the After Visit Summary for                                       Social Drivers of Health (SDOH) Interventions SDOH Screenings   Food Insecurity: No Food Insecurity (10/12/2023)  Housing: Low Risk  (10/12/2023)  Transportation Needs: No Transportation Needs (10/12/2023)  Utilities: Not At Risk (10/12/2023)  Alcohol Screen: Low Risk  (08/20/2023)  Financial Resource Strain: Low Risk  (08/20/2023)  Social Connections: Moderately Integrated (10/12/2023)  Tobacco Use: Low Risk  (10/11/2023)     Readmission Risk Interventions    09/16/2023    2:52 PM 08/20/2023    3:45 PM 08/16/2023   12:42 PM  Readmission Risk Prevention Plan  Transportation Screening Complete Complete Complete  Medication Review Oceanographer) Complete Complete Complete  PCP or Specialist appointment within 3-5 days of discharge Complete Complete   HRI or Home Care Consult Patient refused Complete Complete  SW Recovery Care/Counseling Consult Complete Complete Complete  Palliative Care Screening Not Applicable Not Applicable Not Applicable  Skilled Nursing Facility Not Applicable Not Applicable Not Applicable

## 2023-10-15 ENCOUNTER — Telehealth: Payer: Self-pay | Admitting: *Deleted

## 2023-10-15 NOTE — Patient Outreach (Addendum)
 Care Coordination   10/15/2023 Name: Leslie Parker MRN: [id number] DOB: 1933-06-26   Care Coordination Outreach Attempts:  An unsuccessful telephone outreach was attempted today to offer the patient information about available complex care management services.  Call placed twice, voice message left.  Follow Up Plan:  Additional outreach attempts will be made to offer the patient complex care management information and services.   Encounter Outcome:  No Answer   Care Coordination Interventions:  No, not indicated    Rodney Langton, RN, MSN, CCM [place]  [facility], [facility] RN Care Coordinator Direct Dial: [phone] / Main [phone] Fax [phone] Email: [email or web address]: [email or web address]

## 2023-10-16 ENCOUNTER — Telehealth: Payer: Self-pay | Admitting: *Deleted

## 2023-10-16 NOTE — Patient Outreach (Signed)
 Care Coordination   10/16/2023 Name: Leslie Parker MRN: [id number] DOB: 01/23/1933   Care Coordination Outreach Attempts:  A second unsuccessful outreach was attempted today to offer the patient with information about available complex care management services.  Follow Up Plan:  Additional outreach attempts will be made to offer the patient complex care management information and services.   Encounter Outcome:  No Answer   Care Coordination Interventions:  No, not indicated    Rodney Langton, RN, MSN, CCM [place]  [facility], [facility] RN Care Coordinator Direct Dial: [phone] / Main [phone] Fax [phone] Email: [email or web address] Website: [email or web address]

## 2023-10-18 ENCOUNTER — Other Ambulatory Visit (HOSPITAL_COMMUNITY): Payer: Self-pay | Admitting: Orthopedic Surgery

## 2023-10-18 DIAGNOSIS — M48062 Spinal stenosis, lumbar region with neurogenic claudication: Secondary | ICD-10-CM

## 2023-10-22 ENCOUNTER — Other Ambulatory Visit: Payer: Self-pay

## 2023-10-22 ENCOUNTER — Emergency Department

## 2023-10-22 ENCOUNTER — Inpatient Hospital Stay
Admission: EM | Admit: 2023-10-22 | Discharge: 2023-10-24 | DRG: 280 | Disposition: A | Discharge status: Home Health | Attending: Internal Medicine | Admitting: Internal Medicine

## 2023-10-22 DIAGNOSIS — E876 Hypokalemia: Secondary | ICD-10-CM | POA: Diagnosis present

## 2023-10-22 DIAGNOSIS — Z7984 Long term (current) use of oral hypoglycemic drugs: Secondary | ICD-10-CM

## 2023-10-22 DIAGNOSIS — Z8249 Family history of ischemic heart disease and other diseases of the circulatory system: Secondary | ICD-10-CM

## 2023-10-22 DIAGNOSIS — J4489 Other specified chronic obstructive pulmonary disease: Secondary | ICD-10-CM | POA: Diagnosis present

## 2023-10-22 DIAGNOSIS — I130 Hypertensive heart and chronic kidney disease with heart failure and stage 1 through stage 4 chronic kidney disease, or unspecified chronic kidney disease: Principal | ICD-10-CM | POA: Diagnosis present

## 2023-10-22 DIAGNOSIS — I509 Heart failure, unspecified: Secondary | ICD-10-CM | POA: Diagnosis present

## 2023-10-22 DIAGNOSIS — E1129 Type 2 diabetes mellitus with other diabetic kidney complication: Secondary | ICD-10-CM | POA: Diagnosis present

## 2023-10-22 DIAGNOSIS — N184 Chronic kidney disease, stage 4 (severe): Secondary | ICD-10-CM | POA: Diagnosis present

## 2023-10-22 DIAGNOSIS — I480 Paroxysmal atrial fibrillation: Secondary | ICD-10-CM | POA: Diagnosis present

## 2023-10-22 DIAGNOSIS — Z7901 Long term (current) use of anticoagulants: Secondary | ICD-10-CM | POA: Diagnosis not present

## 2023-10-22 DIAGNOSIS — I21A1 Myocardial infarction type 2: Secondary | ICD-10-CM | POA: Diagnosis present

## 2023-10-22 DIAGNOSIS — R0989 Other specified symptoms and signs involving the circulatory and respiratory systems: Secondary | ICD-10-CM | POA: Diagnosis not present

## 2023-10-22 DIAGNOSIS — Z96653 Presence of artificial knee joint, bilateral: Secondary | ICD-10-CM | POA: Diagnosis present

## 2023-10-22 DIAGNOSIS — K219 Gastro-esophageal reflux disease without esophagitis: Secondary | ICD-10-CM | POA: Diagnosis present

## 2023-10-22 DIAGNOSIS — Z1152 Encounter for screening for COVID-19: Secondary | ICD-10-CM

## 2023-10-22 DIAGNOSIS — I5023 Acute on chronic systolic (congestive) heart failure: Secondary | ICD-10-CM

## 2023-10-22 DIAGNOSIS — Z96611 Presence of right artificial shoulder joint: Secondary | ICD-10-CM | POA: Diagnosis present

## 2023-10-22 DIAGNOSIS — Z825 Family history of asthma and other chronic lower respiratory diseases: Secondary | ICD-10-CM | POA: Diagnosis not present

## 2023-10-22 DIAGNOSIS — Z888 Allergy status to other drugs, medicaments and biological substances status: Secondary | ICD-10-CM | POA: Diagnosis not present

## 2023-10-22 DIAGNOSIS — J90 Pleural effusion, not elsewhere classified: Secondary | ICD-10-CM | POA: Diagnosis not present

## 2023-10-22 DIAGNOSIS — R918 Other nonspecific abnormal finding of lung field: Secondary | ICD-10-CM | POA: Diagnosis not present

## 2023-10-22 DIAGNOSIS — E1122 Type 2 diabetes mellitus with diabetic chronic kidney disease: Secondary | ICD-10-CM | POA: Diagnosis present

## 2023-10-22 DIAGNOSIS — Z79899 Other long term (current) drug therapy: Secondary | ICD-10-CM

## 2023-10-22 DIAGNOSIS — I495 Sick sinus syndrome: Secondary | ICD-10-CM | POA: Diagnosis present

## 2023-10-22 DIAGNOSIS — R609 Edema, unspecified: Secondary | ICD-10-CM | POA: Diagnosis not present

## 2023-10-22 DIAGNOSIS — I110 Hypertensive heart disease with heart failure: Secondary | ICD-10-CM | POA: Diagnosis not present

## 2023-10-22 DIAGNOSIS — R0689 Other abnormalities of breathing: Secondary | ICD-10-CM | POA: Diagnosis not present

## 2023-10-22 DIAGNOSIS — R001 Bradycardia, unspecified: Secondary | ICD-10-CM | POA: Diagnosis not present

## 2023-10-22 DIAGNOSIS — R7989 Other specified abnormal findings of blood chemistry: Secondary | ICD-10-CM | POA: Insufficient documentation

## 2023-10-22 DIAGNOSIS — Z950 Presence of cardiac pacemaker: Secondary | ICD-10-CM

## 2023-10-22 DIAGNOSIS — I10 Essential (primary) hypertension: Secondary | ICD-10-CM | POA: Diagnosis not present

## 2023-10-22 DIAGNOSIS — J449 Chronic obstructive pulmonary disease, unspecified: Secondary | ICD-10-CM | POA: Diagnosis present

## 2023-10-22 HISTORY — DX: Heart failure, unspecified: I50.9

## 2023-10-22 LAB — BASIC METABOLIC PANEL WITH GFR
Anion gap: 12 (ref 5–15)
BUN: 57 mg/dL — ABNORMAL HIGH (ref 8–23)
CO2: 24 mmol/L (ref 22–32)
Calcium: 8.6 mg/dL — ABNORMAL LOW (ref 8.9–10.3)
Chloride: 101 mmol/L (ref 98–111)
Creatinine, Ser: 2.2 mg/dL — ABNORMAL HIGH (ref 0.44–1.00)
GFR, Estimated: 21 mL/min — ABNORMAL LOW (ref >60)
Glucose, Bld: 181 mg/dL — ABNORMAL HIGH (ref 70–99)
Potassium: 3.4 mmol/L — ABNORMAL LOW (ref 3.5–5.1)
Sodium: 137 mmol/L (ref 135–145)

## 2023-10-22 LAB — BLOOD GAS, VENOUS
Acid-Base Excess: 2.7 mmol/L — ABNORMAL HIGH (ref 0.0–2.0)
Bicarbonate: 28.9 mmol/L — ABNORMAL HIGH (ref 20.0–28.0)
Delivery systems: POSITIVE
FIO2: 40 %
Mechanical Rate: 26
Mode: POSITIVE
O2 Saturation: 71.1 %
PEEP: 5 cmH2O
Patient temperature: 37
Pressure control: 12 cmH2O
pCO2, Ven: 50 mmHg (ref 44–60)
pH, Ven: 7.37 (ref 7.25–7.43)
pO2, Ven: 41 mmHg (ref 32–45)

## 2023-10-22 LAB — CBC
HCT: 36.3 % (ref 36.0–46.0)
Hemoglobin: 12.1 g/dL (ref 12.0–15.0)
MCH: 30.9 pg (ref 26.0–34.0)
MCHC: 33.3 g/dL (ref 30.0–36.0)
MCV: 92.6 fL (ref 80.0–100.0)
Platelets: 228 10*3/uL (ref 150–400)
RBC: 3.92 MIL/uL (ref 3.87–5.11)
RDW: 15.4 % (ref 11.5–15.5)
WBC: 6.9 10*3/uL (ref 4.0–10.5)
nRBC: 0 % (ref 0.0–0.2)

## 2023-10-22 LAB — HEPATIC FUNCTION PANEL
ALT: 68 U/L — ABNORMAL HIGH (ref 0–44)
AST: 70 U/L — ABNORMAL HIGH (ref 15–41)
Albumin: 3.3 g/dL — ABNORMAL LOW (ref 3.5–5.0)
Alkaline Phosphatase: 109 U/L (ref 38–126)
Bilirubin, Direct: 0.3 mg/dL — ABNORMAL HIGH (ref 0.0–0.2)
Indirect Bilirubin: 0.7 mg/dL (ref 0.3–0.9)
Total Bilirubin: 1 mg/dL (ref 0.0–1.2)
Total Protein: 6.5 g/dL (ref 6.5–8.1)

## 2023-10-22 LAB — TROPONIN I (HIGH SENSITIVITY)
Troponin I (High Sensitivity): 35 ng/L — ABNORMAL HIGH (ref <18)
Troponin I (High Sensitivity): 47 ng/L — ABNORMAL HIGH (ref <18)
Troponin I (High Sensitivity): 60 ng/L — ABNORMAL HIGH (ref <18)
Troponin I (High Sensitivity): 63 ng/L — ABNORMAL HIGH (ref <18)

## 2023-10-22 LAB — CBG MONITORING, ED
Glucose-Capillary: 132 mg/dL — ABNORMAL HIGH (ref 70–99)
Glucose-Capillary: 104 mg/dL — ABNORMAL HIGH (ref 70–99)

## 2023-10-22 LAB — BRAIN NATRIURETIC PEPTIDE: B Natriuretic Peptide: >4500 pg/mL — ABNORMAL HIGH (ref 0.0–100.0)

## 2023-10-22 LAB — RESP PANEL BY RT-PCR (RSV, FLU A&B, COVID)  RVPGX2
Influenza A by PCR: NEGATIVE
Influenza B by PCR: NEGATIVE
Resp Syncytial Virus by PCR: NEGATIVE
SARS Coronavirus 2 by RT PCR: NEGATIVE

## 2023-10-22 LAB — MAGNESIUM: Magnesium: 1.6 mg/dL — ABNORMAL LOW (ref 1.7–2.4)

## 2023-10-22 LAB — TSH: TSH: 3.395 u[IU]/mL (ref 0.350–4.500)

## 2023-10-22 MED ORDER — INSULIN ASPART 100 UNIT/ML IJ SOLN: 0.0000 [IU] | Freq: Every day | INTRAMUSCULAR | Status: DC

## 2023-10-22 MED ORDER — ADULT MULTIVITAMIN W/MINERALS CH
1.0000 | ORAL_TABLET | Freq: Every day | ORAL | Status: DC
  Administered 2023-10-23 – 2023-10-24 (×2): 1 via ORAL
  Filled 2023-10-22 (×2): qty 1

## 2023-10-22 MED ORDER — IPRATROPIUM-ALBUTEROL 0.5-2.5 (3) MG/3ML IN SOLN: 3.0000 mL | Freq: Four times a day (QID) | RESPIRATORY_TRACT | Status: AC | PRN

## 2023-10-22 MED ORDER — POTASSIUM CHLORIDE CRYS ER 20 MEQ PO TBCR
20.0000 meq | EXTENDED_RELEASE_TABLET | Freq: Once | ORAL | Status: AC
  Administered 2023-10-22: 20 meq via ORAL
  Filled 2023-10-22: qty 1

## 2023-10-22 MED ORDER — FLUTICASONE FUROATE-VILANTEROL 100-25 MCG/ACT IN AEPB
1.0000 | INHALATION_SPRAY | Freq: Every day | RESPIRATORY_TRACT | Status: DC
  Administered 2023-10-23: 1 via RESPIRATORY_TRACT
  Filled 2023-10-22 (×2): qty 28

## 2023-10-22 MED ORDER — INSULIN ASPART 100 UNIT/ML IJ SOLN
0.0000 [IU] | Freq: Three times a day (TID) | INTRAMUSCULAR | Status: DC
  Administered 2023-10-22 – 2023-10-23 (×2): 1 [IU] via SUBCUTANEOUS
  Filled 2023-10-22 (×2): qty 1

## 2023-10-22 MED ORDER — PANTOPRAZOLE SODIUM 40 MG PO TBEC
40.0000 mg | DELAYED_RELEASE_TABLET | Freq: Every day | ORAL | Status: DC
  Administered 2023-10-23 – 2023-10-24 (×2): 40 mg via ORAL
  Filled 2023-10-22 (×2): qty 1

## 2023-10-22 MED ORDER — ACETAMINOPHEN 650 MG RE SUPP: 650.0000 mg | Freq: Four times a day (QID) | RECTAL | Status: DC | PRN

## 2023-10-22 MED ORDER — ONDANSETRON HCL 4 MG/2ML IJ SOLN: 4.0000 mg | Freq: Four times a day (QID) | INTRAMUSCULAR | Status: DC | PRN

## 2023-10-22 MED ORDER — FUROSEMIDE 10 MG/ML IJ SOLN
60.0000 mg | Freq: Once | INTRAMUSCULAR | Status: AC
  Administered 2023-10-22: 60 mg via INTRAVENOUS
  Filled 2023-10-22: qty 8

## 2023-10-22 MED ORDER — HEPARIN SODIUM (PORCINE) 5000 UNIT/ML IJ SOLN: 5000.0000 [IU] | Freq: Three times a day (TID) | INTRAMUSCULAR | Status: DC

## 2023-10-22 MED ORDER — UMECLIDINIUM BROMIDE 62.5 MCG/ACT IN AEPB
1.0000 | INHALATION_SPRAY | Freq: Every day | RESPIRATORY_TRACT | Status: DC
  Administered 2023-10-23: 1 via RESPIRATORY_TRACT
  Filled 2023-10-22 (×2): qty 7

## 2023-10-22 MED ORDER — HYDRALAZINE HCL 50 MG PO TABS
50.0000 mg | ORAL_TABLET | Freq: Three times a day (TID) | ORAL | Status: DC
  Administered 2023-10-22 – 2023-10-24 (×6): 50 mg via ORAL
  Filled 2023-10-22 (×6): qty 1

## 2023-10-22 MED ORDER — VITAMIN C 500 MG PO TABS
250.0000 mg | ORAL_TABLET | Freq: Every day | ORAL | Status: DC
  Administered 2023-10-23 – 2023-10-24 (×2): 250 mg via ORAL
  Filled 2023-10-22 (×2): qty 1

## 2023-10-22 MED ORDER — ACETAMINOPHEN 325 MG PO TABS: 650.0000 mg | ORAL_TABLET | Freq: Four times a day (QID) | ORAL | Status: DC | PRN

## 2023-10-22 MED ORDER — FUROSEMIDE 10 MG/ML IJ SOLN
40.0000 mg | Freq: Once | INTRAMUSCULAR | Status: AC
  Administered 2023-10-22: 40 mg via INTRAVENOUS
  Filled 2023-10-22: qty 4

## 2023-10-22 MED ORDER — APIXABAN 2.5 MG PO TABS
2.5000 mg | ORAL_TABLET | Freq: Two times a day (BID) | ORAL | Status: DC
  Administered 2023-10-22 – 2023-10-24 (×4): 2.5 mg via ORAL
  Filled 2023-10-22 (×4): qty 1

## 2023-10-22 MED ORDER — ISOSORBIDE MONONITRATE ER 30 MG PO TB24
30.0000 mg | ORAL_TABLET | Freq: Every day | ORAL | Status: DC
  Administered 2023-10-22 – 2023-10-23 (×2): 30 mg via ORAL
  Filled 2023-10-22 (×3): qty 1

## 2023-10-22 MED ORDER — VITAMIN D 25 MCG (1000 UNIT) PO TABS
1000.0000 [IU] | ORAL_TABLET | Freq: Every day | ORAL | Status: DC
  Administered 2023-10-23 – 2023-10-24 (×2): 1000 [IU] via ORAL
  Filled 2023-10-22 (×2): qty 1

## 2023-10-22 MED ORDER — CARVEDILOL 6.25 MG PO TABS
6.2500 mg | ORAL_TABLET | Freq: Two times a day (BID) | ORAL | Status: DC
  Administered 2023-10-22 – 2023-10-24 (×4): 6.25 mg via ORAL
  Filled 2023-10-22 (×4): qty 1

## 2023-10-22 MED ORDER — ONDANSETRON HCL 4 MG PO TABS
4.0000 mg | ORAL_TABLET | Freq: Four times a day (QID) | ORAL | Status: DC | PRN
Start: 2023-10-22 — End: 2023-10-27

## 2023-10-22 MED ORDER — TRAZODONE HCL 100 MG PO TABS
100.0000 mg | ORAL_TABLET | Freq: Every day | ORAL | Status: DC
  Administered 2023-10-22 – 2023-10-23 (×2): 100 mg via ORAL
  Filled 2023-10-22 (×2): qty 1

## 2023-10-22 NOTE — ED Notes (Signed)
 Removed bipap, placed on 2L.

## 2023-10-22 NOTE — ED Notes (Signed)
 Pharmacy tech at bedside to do med req. Pt resting comfortably.

## 2023-10-22 NOTE — Assessment & Plan Note (Signed)
 Currently at baseline 

## 2023-10-22 NOTE — H&P (Addendum)
 History and Physical   Leslie Parker:[id number] DOB: 05-05-1933 DOA: 10/22/2023  PCP: [name], MD  Outpatient Specialists: Dr. Juliann Pares Patient coming from: home via EMS  I have personally briefly reviewed patient's old medical records in [facility] EMR.  Chief Concern: Shortness of breath  HPI: Leslie Parker is a 89 year old female with history of CKD stage IV, severe heart failure reduced ejection fraction, hypertension, non-insulin-dependent diabetes mellitus, GERD, history of diverticulosis with diverticulitis requiring hospitalization, recent frequent hospitalization for heart failure exacerbation, who presents to the emergency department for chief turns of shortness of breath.  Per documentation, patient had SpO2 of 80% on room air and was ultimately placed on CPAP at the time of arrival, blood pressure 200s over 120s.  Patient received 2 doses the nitroglycerin paste, enalapril 1.25 mg IV one-time dose.  Vitals in the ED showed temperature of 96.8, respiration rate 18, heart rate 79, blood pressure 181/97, SpO2 of 97% on BiPAP therapy.  Per ED provider, patient is currently titrated off of BiPAP and on 2 L nasal cannula.  Serum sodium is 137, potassium 3.4, chloride 101, bicarb 24, BUN of 57, serum creatinine 2.20, EGFR 21, nonfasting blood glucose 181, WBC 6.9, hemoglobin 12.1, platelets of 228.  BNP was greater than 4500.  High sensitive troponin was 35 and on repeat is 47.  AST was 70.  ALT is 68.    COVID/influenza A/influenza B/RSV PCR were negative.  ED treatment: BiPAP therapy, furosemide 40 mg IV one-time dose. ----------------------------------------- At bedside, patient is able to tell me her first and last name, age, location, current calendar year.  She states that on October 16, 2023, her daughter weighed her and she was 169 pounds and yesterday her daughter states that she was 172 pounds.  She is worsening swelling of her lower extremities and  shortness of breath, worse with exertion and with laying flat.  She denies trauma to her person, chest pain, abdominal pain, dysuria, hematuria, diarrhea, blood in her stool.  She denies fever, nausea, vomiting.  Daily fluid intake: Water: 3 bottles (16 oz) = 48 oz Coffee: 2 cups (10 oz cup) = 20 oz Ginger ale bottle: 16 oz Gatorade botter: 16 - 32 oz bottle Tea: none at home Soup: infrequent Etoh: none Soda: none Juice: none  She reports that she does not use salt in her food.  She does not eat salty foods including deli meats. She reports that she uses non-salt seasoning.  Social history: She lives at home on her own.  She denies tobacco, EtOH, recreational drug use.  ROS: Constitutional: + weight change, no fever ENT/Mouth: no sore throat, no rhinorrhea Eyes: no eye pain, no vision changes Cardiovascular: no chest pain, + dyspnea,  + edema, no palpitations Respiratory: no cough, no sputum, no wheezing Gastrointestinal: no nausea, no vomiting, no diarrhea, no constipation Genitourinary: no urinary incontinence, no dysuria, no hematuria Musculoskeletal: no arthralgias, no myalgias Skin: no skin lesions, no pruritus, Neuro: + weakness, no loss of consciousness, no syncope Psych: no anxiety, no depression, no decrease appetite Heme/Lymph: no bruising, no bleeding  ED Course: Discussed with EDP, patient requiring hospitalization for chief concerns of heart failure exacerbation.  Assessment/Plan  Principal Problem:   Acute exacerbation of CHF (congestive heart failure) (HCC) Active Problems:   COPD (chronic obstructive pulmonary disease) (HCC)   Sick sinus syndrome (HCC)   Pleural effusion due to CHF (congestive heart failure) (HCC)   Paroxysmal atrial fibrillation (HCC)   Hypokalemia  Hypomagnesemia   Type II diabetes mellitus with renal manifestations (HCC)   CKD (chronic kidney disease) stage 4, GFR 15-29 ml/min (HCC)   Elevated troponin   Assessment and Plan:  *  Acute exacerbation of CHF (congestive heart failure) (HCC) Etiology workup in progress, differentials include hypothyroid versus increased fluid intake without restrictions at home Strict I's and O's Heart healthy diet with fluid restriction of 1500 mL liter In setting of frequent hospitalization for heart failure exacerbation, and marked change from echo in July 2024 and echo in January 2025, I will check a TSH Status post furosemide 40 mg IV one-time dose per EDP Furosemide 60 mg IV one-time dose ordered on admission Recheck BMP in the a.m.  Addendum: Extensive counseling for patient to restrict fluid intake to 40 to 50 ounces per day.  Patient to remove ginger ale.  Patient can keep coffee and decrease to 2 bottles of water per day.  Occasionally she can have 10 ounces of Gatorade.  Patient endorses understanding and compliance.  Sick sinus syndrome (HCC) Status post pacemaker placement  COPD (chronic obstructive pulmonary disease) (HCC) Gold stage IV. Not in acute exacerbation. DuoNebs every 6 hours as needed for shortness of breath and wheezing, 1 day ordered  Paroxysmal atrial fibrillation (HCC) Home Eliquis 2.5 mg p.o. twice daily resumed Coreg 6.25 mg p.o. twice daily resumed  CKD (chronic kidney disease) stage 4, GFR 15-29 ml/min (HCC) Currently at baseline  Type II diabetes mellitus with renal manifestations (HCC) Home glipizide will not be resumed on admission Insulin SSI, renal dosing, with at bedtime coverage ordered on admission  Hypomagnesemia Check serum magnesium level on admission  Elevated troponin Query demand ischemia in setting of severe heart failure exacerbation We will continue with fluid restriction and IV lasix on admission Recheck 3rd HS Troponin Continue telemetry cardiac monitoring  Chart reviewed.   Complete echo 02/09/2023: Estimated ejection fraction was 60 to 65%, grade 1 diastolic dysfunction.  Complete echo 08/09/2023: Estimate ejection  fraction is 25 to 30%  DVT prophylaxis: Heparin 5000 units subcutaneous every 8 hours Code Status: Full code Diet: Heart healthy diet with fluid restriction to 1500 mL Family Communication: No Disposition Plan: Pending clinical course Consults called: None at this time Admission status: telemetry cardiac, inpatient  Past Medical History:  Diagnosis Date   Acute kidney injury superimposed on CKD (HCC) 11/24/2022   Asthma    Atrial fibrillation with RVR (HCC)    Cancer (HCC)    Basal Cell   CHF (congestive heart failure) (HCC)    Closed left hip fracture (HCC) 01/25/2017   Closed right hip fracture (HCC) 02/13/2023   Diabetes mellitus without complication (HCC)    Femur fracture, left (HCC) 02/03/2015   Heart murmur    Hypertension    Impetigo    Osteoarthritis    Osteopenia    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   Rapid atrial fibrillation, new onset(HCC) 06/05/2022   Vomiting    can not due to surgery   Wears dentures    full upper and lower   Past Surgical History:  Procedure Laterality Date   ABDOMINAL HYSTERECTOMY     BACK SURGERY     BLADDER SURGERY     mesh   CARPAL TUNNEL RELEASE Bilateral    CATARACT EXTRACTION Right 2017   CATARACT EXTRACTION W/PHACO Left 02/06/2016   Procedure: CATARACT EXTRACTION PHACO AND INTRAOCULAR LENS PLACEMENT (IOC) left eye;  Surgeon: Sherald Hess, MD;  Location: [facility];  Service: Ophthalmology;  Laterality: Left;  DIABETIC LEFT Cannot arrive before 9:30   CERVICAL DISC SURGERY     CHOLECYSTECTOMY     EYE SURGERY Bilateral    Cataract Extraction with IOL   FEMUR IM NAIL Left 02/04/2015   Procedure: INTRAMEDULLARY (IM) NAIL FEMORAL;  Surgeon: Juanell Fairly, MD;  Location: [facility];  Service: Orthopedics;  Laterality: Left;   HARDWARE REMOVAL Left 01/17/2017   Procedure: HARDWARE REMOVAL;  Surgeon: Juanell Fairly, MD;  Location: [facility];  Service: Orthopedics;  Laterality: Left;   HERNIA REPAIR  2014    esophageal and gastric mesh. patient unable to throw up d/t mesh   HIP ARTHROPLASTY Left 01/26/2017   Procedure: ARTHROPLASTY BIPOLAR HIP (HEMIARTHROPLASTY) removal hardware left hip;  Surgeon: Deeann Saint, MD;  Location: [facility];  Service: Orthopedics;  Laterality: Left;   INTRAMEDULLARY (IM) NAIL INTERTROCHANTERIC Right 02/13/2023   Procedure: INTRAMEDULLARY (IM) NAIL INTERTROCHANTERIC;  Surgeon: Christena Flake, MD;  Location: [facility];  Service: Orthopedics;  Laterality: Right;   JOINT REPLACEMENT Bilateral    knees   PACEMAKER IMPLANT N/A 11/15/2022   Procedure: PACEMAKER IMPLANT;  Surgeon: Marcina Millard, MD;  Location: [facility];  Service: Cardiovascular;  Laterality: N/A;   PACEMAKER IMPLANT N/A 11/28/2022   Procedure: PACEMAKER IMPLANT;  Surgeon: Marcina Millard, MD;  Location: [facility];  Service: Cardiovascular;  Laterality: N/A;  Lead reposition   REPLACEMENT TOTAL KNEE BILATERAL Bilateral [id number]   SHOULDER ARTHROSCOPY WITH OPEN ROTATOR CUFF REPAIR AND DISTAL CLAVICLE ACROMINECTOMY Left 10/25/2016   Procedure: SHOULDER ARTHROSCOPY WITH OPEN ROTATOR CUFF REPAIR AND DISTAL CLAVICLE ACROMINECTOMY;  Surgeon: Juanell Fairly, MD;  Location: [facility];  Service: Orthopedics;  Laterality: Left;   THYROID SURGERY     goiter removed   TOTAL HIP REVISION Left 12/02/2017   Procedure: TOTAL HIP REVISION;  Surgeon: Lyndle Herrlich, MD;  Location: [facility];  Service: Orthopedics;  Laterality: Left;   TOTAL SHOULDER REPLACEMENT Right 2012   Social History:  reports that she has never smoked. She has never used smokeless tobacco. She reports that she does not drink alcohol and does not use drugs.  Allergies  Allergen Reactions   Baclofen Other (See Comments)    Elevated Cr   Simvastatin Other (See Comments)    Pt denies  Elevated LFTs with simva 40mg , stopped and resolved (see MD note 11/10/13)   Family History  Problem Relation Age of Onset   Heart failure  Mother    Hypertension Mother    Emphysema Father    Family history: Family history reviewed and not pertinent  Prior to Admission medications   Medication Sig Start Date End Date Taking? Authorizing Provider  acetaminophen (TYLENOL) 325 MG tablet Take 2 tablets (650 mg total) by mouth every 4 (four) hours as needed for headache or mild pain (pain score 1-3). 09/19/23   Lurene Shadow, MD  albuterol (VENTOLIN HFA) 108 (90 Base) MCG/ACT inhaler Inhale 2 puffs into the lungs every 6 (six) hours as needed. 08/04/19   [provider]  amiodarone (PACERONE) 200 MG tablet Take 1 tablet by mouth daily. 08/26/23   [provider]  apixaban (ELIQUIS) 2.5 MG TABS tablet Take 1 tablet (2.5 mg total) by mouth 2 (two) times daily. 06/07/22   Tresa Moore, MD  ascorbic acid (VITAMIN C) 250 MG CHEW Chew 250 mg by mouth daily.    [provider]  carvedilol (COREG) 6.25 MG tablet Take 1 tablet (6.25 mg total) by mouth 2 (  two) times daily with a meal. 08/11/23 11/09/23  Carollee Herter, DO  Cholecalciferol (VITAMIN D-1000 MAX ST) 25 MCG (1000 UT) tablet Take 1,000 Units by mouth daily.    [provider]  colestipol (COLESTID) 1 g tablet Take 1 tablet by mouth 2 (two) times daily. Patient not taking: Reported on 10/11/2023 06/01/22   [provider]  Cyanocobalamin (B-12) 2500 MCG TABS Take 2,500 mcg by mouth daily.    [provider]  glipiZIDE (GLUCOTROL XL) 2.5 MG 24 hr tablet Take 2.5 mg by mouth daily.    [provider]  hydrALAZINE (APRESOLINE) 50 MG tablet Take 1 tablet (50 mg total) by mouth 3 (three) times daily. 08/11/23 11/09/23  Carollee Herter, DO  ipratropium-albuterol (DUONEB) 0.5-2.5 (3) MG/3ML SOLN Inhale 3 mLs into the lungs every 6 (six) hours as needed. 06/01/22   [provider]  isosorbide mononitrate (IMDUR) 30 MG 24 hr tablet Take 1 tablet (30 mg total) by mouth at bedtime. 08/11/23 11/09/23  Carollee Herter, DO  Magnesium Oxide 500  MG CAPS Take 500 mg by mouth daily.    [provider]  Multiple Vitamin (MULTIVITAMIN WITH MINERALS) TABS tablet Take 1 tablet by mouth daily. One-A-Day Women's Vitamin    [provider]  omeprazole (PRILOSEC) 20 MG capsule Take 20 mg by mouth every morning. 01/10/23   [provider]  torsemide (DEMADEX) 10 MG tablet Take 1 tablet (10 mg total) by mouth daily. 10/14/23   Arnetha Courser, MD  traZODone (DESYREL) 100 MG tablet Take 100 mg by mouth at bedtime.    [provider]  TRELEGY ELLIPTA 100-62.5-25 MCG/ACT AEPB Inhale 1 puff into the lungs daily. 08/03/22   [provider]   Physical Exam: Vitals:   10/22/23 1230 10/22/23 1245 10/22/23 1330 10/22/23 1410  BP: (!) 176/85  (!) 166/88   Pulse: (!) 58 62 63   Resp: 11 19 16    Temp:    (!) 97.2 F (36.2 C)  TempSrc:    Axillary  SpO2: 96% 98% 97%   Weight:      Height:       Constitutional: appears age-appropriate, NAD, calm Eyes: PERRL, lids and conjunctivae normal ENMT: Mucous membranes are moist. Posterior pharynx clear of any exudate or lesions. Age-appropriate dentition. Hearing appropriate Neck: normal, supple, no masses, no thyromegaly Respiratory: B/L decreased lung sounds in lower lobes, no wheezing, no crackles. Normal respiratory effort. No accessory muscle use.  Cardiovascular: Regular rate and rhythm, no murmurs / rubs / gallops.  Bilateral lower extremity 2+ pitting edema. 2+ pedal pulses. No carotid bruits.  Abdomen: Obese abdomen, no tenderness, no masses palpated, no hepatosplenomegaly. Bowel sounds positive.  Musculoskeletal: no clubbing / cyanosis. No joint deformity upper and lower extremities. Good ROM, no contractures, no atrophy. Normal muscle tone.  Skin: no rashes, lesions, ulcers. No induration Neurologic: Sensation intact. Strength 5/5 in all 4.  Psychiatric: Normal judgment and insight. Alert and oriented x 3.  Pleasant mood.   EKG: independently reviewed, showing  ventricular paced with rate of 84, QTc 575  Chest x-ray on Admission: I personally reviewed and I agree with radiologist reading as below.  DG Chest Port 1 View Result Date: 10/22/2023 CLINICAL DATA:  161096 Pain 144615.  Shortness of breath. EXAM: PORTABLE CHEST 1 VIEW COMPARISON:  10/12/2023. FINDINGS: Moderate diffuse pulmonary vascular congestion with bilateral layering pleural effusions, left greater than right, grossly similar to the prior study. There probable underlying compressive atelectatic changes at the lung  bases. Bilateral lung fields are otherwise clear. No pneumothorax. Evaluation of cardiomediastinal silhouette is nondiagnostic due to left lower hemithorax opacification. There is a left sided 2-lead pacemaker. No acute osseous abnormalities. Right reverse shoulder arthroplasty noted. The soft tissues are within normal limits. IMPRESSION: Moderate pulmonary vascular congestion with bilateral layering pleural effusions, left greater than right, grossly similar to the prior study and compatible with congestive heart failure/pulmonary edema. Electronically Signed   By: Jules Schick M.D.   On: 10/22/2023 10:49   Labs on Admission: I have personally reviewed following labs  CBC: Recent Labs  Lab 10/22/23 0857  WBC 6.9  HGB 12.1  HCT 36.3  MCV 92.6  PLT 228   Basic Metabolic Panel: Recent Labs  Lab 10/22/23 0857  NA 137  K 3.4*  CL 101  CO2 24  GLUCOSE 181*  BUN 57*  CREATININE 2.20*  CALCIUM 8.6*   GFR: Estimated Creatinine Clearance: 17.3 mL/min (A) (by C-G formula based on SCr of 2.2 mg/dL (H)).  Liver Function Tests: Recent Labs  Lab 10/22/23 0857  AST 70*  ALT 68*  ALKPHOS 109  BILITOT 1.0  PROT 6.5  ALBUMIN 3.3*   Urine analysis:    Component Value Date/Time   COLORURINE YELLOW (A) 09/15/2023 0848   APPEARANCEUR CLEAR (A) 09/15/2023 0848   APPEARANCEUR Clear 02/19/2013 0254   LABSPEC 1.011 09/15/2023 0848   LABSPEC 1.006 02/19/2013 0254    PHURINE 6.0 09/15/2023 0848   GLUCOSEU >=500 (A) 09/15/2023 0848   GLUCOSEU Negative 02/19/2013 0254   HGBUR NEGATIVE 09/15/2023 0848   BILIRUBINUR NEGATIVE 09/15/2023 0848   BILIRUBINUR Negative 02/19/2013 0254   KETONESUR NEGATIVE 09/15/2023 0848   PROTEINUR 100 (A) 09/15/2023 0848   NITRITE NEGATIVE 09/15/2023 0848   LEUKOCYTESUR TRACE (A) 09/15/2023 0848   LEUKOCYTESUR 1+ 02/19/2013 0254   This document was prepared using Dragon Voice Recognition software and may include unintentional dictation errors.  Dr. Sedalia Muta [facility]  If 7PM-7AM, please contact overnight-coverage provider If 7AM-7PM, please contact day attending provider [email or web address]  10/22/2023, 2:29 PM

## 2023-10-22 NOTE — Assessment & Plan Note (Signed)
 Check serum magnesium level on admission

## 2023-10-22 NOTE — Hospital Course (Signed)
 Ms. Leslie Parker is a 89 year old female with history of CKD stage IV, severe heart failure reduced ejection fraction, hypertension, non-insulin-dependent diabetes mellitus, GERD, history of diverticulosis with diverticulitis requiring hospitalization, recent frequent hospitalization for heart failure exacerbation, who presents to the emergency department for chief turns of shortness of breath.  Per documentation, patient had SpO2 of 80% on room air and was ultimately placed on CPAP at the time of arrival, blood pressure 200s over 120s.  Patient received 2 doses the nitroglycerin paste, enalapril 1.25 mg IV one-time dose.  Vitals in the ED showed temperature of 96.8, respiration rate 18, heart rate 79, blood pressure 181/97, SpO2 of 97% on BiPAP therapy.  Per ED provider, patient is currently titrated off of BiPAP and on 2 L nasal cannula.  Serum sodium is 137, potassium 3.4, chloride 101, bicarb 24, BUN of 57, serum creatinine 2.20, EGFR 21, nonfasting blood glucose 181, WBC 6.9, hemoglobin 12.1, platelets of 228.  BNP was greater than 4500.  High sensitive troponin was 35 and on repeat is 47.  AST was 70.  ALT is 68.    COVID/influenza A/influenza B/RSV PCR were negative.  ED treatment: BiPAP therapy, furosemide 40 mg IV one-time dose.

## 2023-10-22 NOTE — ED Notes (Signed)
 Will trial off bipap after pt wakes. Pt sleeping.

## 2023-10-22 NOTE — Assessment & Plan Note (Signed)
 Query demand ischemia in setting of severe heart failure exacerbation We will continue with fluid restriction and IV lasix on admission Recheck 3rd HS Troponin Continue telemetry cardiac monitoring

## 2023-10-22 NOTE — Assessment & Plan Note (Addendum)
 Status post pacemaker placement.

## 2023-10-22 NOTE — Assessment & Plan Note (Addendum)
 Gold stage IV. Not in acute exacerbation. DuoNebs every 6 hours as needed for shortness of breath and wheezing, 1 day ordered

## 2023-10-22 NOTE — ED Provider Notes (Signed)
 [facility] Provider Note    Event Date/Time   First MD Initiated Contact with Patient 10/22/23 [phone]     (approximate)   History   Shortness of Breath   HPI  Leslie Parker is a 89 year old female with history of CHF, COPD, A-fib on Eliquis, CKD, HTN presenting to the emergency department for evaluation of shortness of breath.  Patient reports that yesterday she had had onset of worsening shortness of breath, similar to prior episodes.  With EMS she was found to be 80% on room air.  Arrives on CPAP.  Was hypertensive with EMS, had to into the nitroglycerin paste placed, given IV enalapril.  Patient denies chest pain, fevers.  I reviewed her discharge summary from 10/14/2023.  At that time, patient presented with shortness of breath found to be related to likely CHF exacerbation for which she was given IV diuresis.  Has had multiple hospitalizations for similar this year.  It was recommended that patient transition to hospice, but was not interested at that time.      Physical Exam   Triage Vital Signs: ED Triage Vitals  Encounter Vitals Group     BP 10/22/23 0853 (!) 173/100     Systolic BP Percentile --      Diastolic BP Percentile --      Pulse Rate 10/22/23 0853 84     Resp 10/22/23 0853 (!) 28     Temp 10/22/23 0856 (!) 96.8 F (36 C)     Temp Source 10/22/23 0856 Axillary     SpO2 10/22/23 0853 95 %     Weight 10/22/23 0902 176 lb 5.9 oz (80 kg)     Height 10/22/23 0902 5\' 2"  (1.575 m)     Head Circumference --      Peak Flow --      Pain Score 10/22/23 0852 0     Pain Loc --      Pain Education --      Exclude from Growth Chart --     Most recent vital signs: Vitals:   10/22/23 1230 10/22/23 1245  BP: (!) 176/85   Pulse: (!) 58 62  Resp: 11 19  Temp:    SpO2: 96% 98%     General: Awake, interactive  CV:  Regular rate, good peripheral perfusion.  Resp:  Tachypneic with labored respirations, diminished breath sounds with crackles  at the bases, bilateral lower extremity pitting edema Abd:  Nondistended, soft, nontender Neuro:  Symmetric facial movement, fluid speech   ED Results / Procedures / Treatments   Labs (all labs ordered are listed, but only abnormal results are displayed) Labs Reviewed  BLOOD GAS, VENOUS - Abnormal; Notable for the following components:      Result Value   Bicarbonate 28.9 (*)    Acid-Base Excess 2.7 (*)    All other components within normal limits  BASIC METABOLIC PANEL WITH GFR - Abnormal; Notable for the following components:   Potassium 3.4 (*)    Glucose, Bld 181 (*)    BUN 57 (*)    Creatinine, Ser 2.20 (*)    Calcium 8.6 (*)    GFR, Estimated 21 (*)    All other components within normal limits  BRAIN NATRIURETIC PEPTIDE - Abnormal; Notable for the following components:   B Natriuretic Peptide >4,500.0 (*)    All other components within normal limits  HEPATIC FUNCTION PANEL - Abnormal; Notable for the following components:   Albumin 3.3 (*)  AST 70 (*)    ALT 68 (*)    Bilirubin, Direct 0.3 (*)    All other components within normal limits  TROPONIN I (HIGH SENSITIVITY) - Abnormal; Notable for the following components:   Troponin I (High Sensitivity) 35 (*)    All other components within normal limits  TROPONIN I (HIGH SENSITIVITY) - Abnormal; Notable for the following components:   Troponin I (High Sensitivity) 47 (*)    All other components within normal limits  RESP PANEL BY RT-PCR (RSV, FLU A&B, COVID)  RVPGX2  CBC     EKG EKG independently reviewed interpreted by myself (ER attending) demonstrates:  EKG demonstrates ventricularly paced rhythm at a rate of 84, PR 73, QRS 195, QTc 575, no appreciable superimposed ischemia  RADIOLOGY Imaging independently reviewed and interpreted by myself demonstrates:  CXR redemonstrates vascular congestion with findings suggestive of pulmonary edema/CHF  PROCEDURES:  Critical Care performed: Yes, see critical care  procedure note(s)  CRITICAL CARE Performed by: Trinna Post   Total critical care time: 31 minutes  Critical care time was exclusive of separately billable procedures and treating other patients.  Critical care was necessary to treat or prevent imminent or life-threatening deterioration.  Critical care was time spent personally by me on the following activities: development of treatment plan with patient and/or surrogate as well as nursing, discussions with consultants, evaluation of patient's response to treatment, examination of patient, obtaining history from patient or surrogate, ordering and performing treatments and interventions, ordering and review of laboratory studies, ordering and review of radiographic studies, pulse oximetry and re-evaluation of patient's condition.   Procedures   MEDICATIONS ORDERED IN ED: Medications  furosemide (LASIX) injection 40 mg (40 mg Intravenous Given 10/22/23 0909)     IMPRESSION / MDM / ASSESSMENT AND PLAN / ED COURSE  I reviewed the triage vital signs and the nursing notes.  Differential diagnosis includes, but is not limited to, CHF exacerbation, lower suspicion COPD exacerbation in the absence of wheezing, consideration but lower suspicion ACS, pneumonia, pneumothorax  Patient's presentation is most consistent with acute presentation with potential threat to life or bodily function.  89 year old female presenting to the emergency department evaluation of shortness of breath, initially hypoxic with increased work of breathing.  Labs with significantly elevated BNP, x-Abner Ardis suggestive of pulmonary edema fitting with clinical picture of CHF exacerbation.  Stable renal dysfunction.  Mild troponin elevation, likely related to demand.  Patient was ordered for IV Lasix.  Breathing did improve on BiPAP, reassuring VBG.  Will reach out to hospitalist team to discuss admission.  12:58 PM Case discussed with Dr. Sedalia Muta. She will evaluate for anticipated  admission.       FINAL CLINICAL IMPRESSION(S) / ED DIAGNOSES   Final diagnoses:  Acute on chronic congestive heart failure, unspecified heart failure type (HCC)     Rx / DC Orders   ED Discharge Orders     None        Note:  This document was prepared using Dragon voice recognition software and may include unintentional dictation errors.   Trinna Post, MD 10/22/23 [phone]

## 2023-10-22 NOTE — Assessment & Plan Note (Addendum)
 Etiology workup in progress, differentials include hypothyroid versus increased fluid intake without restrictions at home Strict I's and O's Heart healthy diet with fluid restriction of 1500 mL liter In setting of frequent hospitalization for heart failure exacerbation, and marked change from echo in July 2024 and echo in January 2025, I will check a TSH Status post furosemide 40 mg IV one-time dose per EDP Furosemide 60 mg IV one-time dose ordered on admission Recheck BMP in the a.m.  Addendum: Extensive counseling for patient to restrict fluid intake to 40 to 50 ounces per day.  Patient to remove ginger ale.  Patient can keep coffee and decrease to 2 bottles of water per day.  Occasionally she can have 10 ounces of Gatorade.  Patient endorses understanding and compliance.

## 2023-10-22 NOTE — ED Notes (Signed)
 1 set blood cultures drawn with 18# IV placement.

## 2023-10-22 NOTE — TOC Initial Note (Signed)
 Transition of Care [facility]) - Initial/Assessment Note    Patient Details  Name: Leslie Parker MRN: [id number] Date of Birth: 1933/06/06  [facility] CM/SW Contact:    Colin Broach, LCSW Phone Number: 10/22/2023, 2:28 PM  Clinical Narrative: Per chart review.  Patient recently at [facility].  Her PCP is Dr. Nemiah Commander.  [facility]  She has a can, rollator, WC, BSC, built-in shower grab bars.  Patient currently on 2L [place].  Patient's daughter or granddaughter will transport home at discharge.  TOC to continue to follow for discharge needs that may arise.                        Patient Goals and CMS Choice            Expected Discharge Plan and Services                                              Prior Living Arrangements/Services                       Activities of Daily Living      Permission Sought/Granted                  Emotional Assessment              Admission diagnosis:  Acute exacerbation of CHF (congestive heart failure) (HCC) [I50.9] Patient Active Problem List   Diagnosis Date Noted   Acute exacerbation of CHF (congestive heart failure) (HCC) 10/22/2023   Elevated troponin 10/22/2023   Malnutrition of moderate degree 09/17/2023   CHF (congestive heart failure) (HCC) 09/15/2023   Acute on chronic systolic CHF (congestive heart failure) (HCC) 08/19/2023   Pleural effusion due to CHF (congestive heart failure) (HCC) 08/19/2023   HTN (hypertension), malignant 08/19/2023   Ileus (HCC) 08/12/2023   Large bowel obstruction (HCC) 08/12/2023   CKD (chronic kidney disease) stage 4, GFR 15-29 ml/min (HCC) 08/12/2023   HFrEF (heart failure with reduced ejection fraction) (HCC) 08/12/2023   Acute on chronic combined systolic and diastolic CHF (congestive heart failure) (HCC) 08/10/2023   Hyponatremia 05/05/2023   Overweight (BMI 25.0-29.9) 05/01/2023   Left hip pain 05/01/2023   Hypokalemia 04/30/2023   Anemia  03/21/2023   Abnormal LFTs 03/20/2023   Type II diabetes mellitus with renal manifestations (HCC) 02/13/2023   Chronic diastolic CHF (congestive heart failure) (HCC) 02/13/2023   CKD stage 3 due to type 2 diabetes mellitus (HCC) 11/24/2022   Paroxysmal atrial fibrillation (HCC) 11/24/2022   Sick sinus syndrome (HCC) 11/15/2022   Acute on chronic diastolic CHF (congestive heart failure) (HCC) 08/27/2022   COPD (chronic obstructive pulmonary disease) (HCC) 08/23/2022   Chronic a-fib (HCC) 08/22/2022   Diabetes mellitus without complication (HCC)    Stage 3b chronic kidney disease (HCC) - Baseline scr 1.8-2.0    Postural dizziness with presyncope    Hypomagnesemia    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 [place] [address] Phone: [phone] Fax: [phone]  [id number] [place], [place] - [address] [place] [address] Phone: [phone] Fax: [phone]  [facility] [address] [place] [address] Phone: [phone] Fax: [phone]     Social Drivers of Health (SDOH) Social History: SDOH Screenings   Food Insecurity: No Food Insecurity (10/12/2023)  Housing: Low Risk  (10/12/2023)  Transportation Needs: No Transportation Needs (10/12/2023)  Utilities: Not At Risk (10/12/2023)  Alcohol Screen: Low Risk  (08/20/2023)  Financial Resource Strain: Low Risk  (08/20/2023)  Social Connections: Moderately Integrated (10/12/2023)  Tobacco Use: Low Risk  (10/22/2023)   SDOH Interventions:     Readmission Risk Interventions    10/22/2023    2:28 PM 09/16/2023    2:52 PM 08/20/2023    3:45 PM  Readmission Risk Prevention Plan  Transportation Screening Complete Complete Complete   Medication Review Oceanographer) Complete Complete Complete  PCP or Specialist appointment within 3-5 days of discharge  Complete Complete  HRI or Home Care Consult Not Complete Patient refused Complete  HRI or Home Care Consult Pt Refusal Comments No PT recs at this time.    SW Recovery Care/Counseling Consult Complete Complete Complete  Palliative Care Screening Not Applicable Not Applicable Not Applicable  Skilled Nursing Facility Not Applicable Not Applicable Not Applicable

## 2023-10-22 NOTE — ED Notes (Signed)
 RT was at bedside and was able to fix air leak on bipap mask.

## 2023-10-22 NOTE — Assessment & Plan Note (Signed)
 Home Eliquis 2.5 mg p.o. twice daily resumed Coreg 6.25 mg p.o. twice daily resumed

## 2023-10-22 NOTE — ED Notes (Signed)
 Daughter [name] at bedside, updated on pt status. Turned oxygen to 1L.

## 2023-10-22 NOTE — Assessment & Plan Note (Signed)
 Home glipizide will not be resumed on admission Insulin SSI, renal dosing, with at bedtime coverage ordered on admission

## 2023-10-22 NOTE — ED Notes (Signed)
 Pt resting comfortably and tolerating bipap well.

## 2023-10-22 NOTE — ED Triage Notes (Signed)
 to ED [facility], hx CHF, called EMS for SOB 80% on RA with fire dept NRB placed, still felt SOB. arrives on [facility]  2 inch NTG paste, 1.25mg  enalapril IV, 22g L finger  EMS VS: 22-28 ETCO2, BP 200s/125s, CBG 175, RR 30s  EDP and RT at bedside

## 2023-10-23 ENCOUNTER — Encounter: Payer: Self-pay | Admitting: Internal Medicine

## 2023-10-23 DIAGNOSIS — I5023 Acute on chronic systolic (congestive) heart failure: Secondary | ICD-10-CM | POA: Diagnosis not present

## 2023-10-23 LAB — BASIC METABOLIC PANEL WITH GFR
Anion gap: 11 (ref 5–15)
BUN: 53 mg/dL — ABNORMAL HIGH (ref 8–23)
CO2: 26 mmol/L (ref 22–32)
Calcium: 8.4 mg/dL — ABNORMAL LOW (ref 8.9–10.3)
Chloride: 102 mmol/L (ref 98–111)
Creatinine, Ser: 1.95 mg/dL — ABNORMAL HIGH (ref 0.44–1.00)
GFR, Estimated: 24 mL/min — ABNORMAL LOW (ref >60)
Glucose, Bld: 104 mg/dL — ABNORMAL HIGH (ref 70–99)
Potassium: 3.1 mmol/L — ABNORMAL LOW (ref 3.5–5.1)
Sodium: 139 mmol/L (ref 135–145)

## 2023-10-23 LAB — CBC
HCT: 29.8 % — ABNORMAL LOW (ref 36.0–46.0)
Hemoglobin: 10.3 g/dL — ABNORMAL LOW (ref 12.0–15.0)
MCH: 31.8 pg (ref 26.0–34.0)
MCHC: 34.6 g/dL (ref 30.0–36.0)
MCV: 92 fL (ref 80.0–100.0)
Platelets: 193 10*3/uL (ref 150–400)
RBC: 3.24 MIL/uL — ABNORMAL LOW (ref 3.87–5.11)
RDW: 15.4 % (ref 11.5–15.5)
WBC: 6.5 10*3/uL (ref 4.0–10.5)
nRBC: 0 % (ref 0.0–0.2)

## 2023-10-23 LAB — CBG MONITORING, ED
Glucose-Capillary: 105 mg/dL — ABNORMAL HIGH (ref 70–99)
Glucose-Capillary: 123 mg/dL — ABNORMAL HIGH (ref 70–99)
Glucose-Capillary: 110 mg/dL — ABNORMAL HIGH (ref 70–99)
Glucose-Capillary: 108 mg/dL — ABNORMAL HIGH (ref 70–99)

## 2023-10-23 MED ORDER — POTASSIUM CHLORIDE CRYS ER 20 MEQ PO TBCR
40.0000 meq | EXTENDED_RELEASE_TABLET | ORAL | Status: AC
  Administered 2023-10-23 (×2): 40 meq via ORAL
  Filled 2023-10-23 (×2): qty 2

## 2023-10-23 MED ORDER — MAGNESIUM SULFATE 2 GM/50ML IV SOLN
2.0000 g | Freq: Once | INTRAVENOUS | Status: AC
  Administered 2023-10-23: 2 g via INTRAVENOUS
  Filled 2023-10-23: qty 50

## 2023-10-23 MED ORDER — FUROSEMIDE 10 MG/ML IJ SOLN
40.0000 mg | Freq: Two times a day (BID) | INTRAMUSCULAR | Status: DC
  Administered 2023-10-23 – 2023-10-24 (×3): 40 mg via INTRAVENOUS
  Filled 2023-10-23 (×3): qty 4

## 2023-10-23 NOTE — ED Notes (Signed)
 Introduced self to pt. Pt sitting up in bed, watching TV. Denies needs at this time. Pt denies pain. Pt on purewick which seems to be working appropriately at this time. Per dayshift RN, purewick was recently changed out. Bed in low, locked position. Call bell in reach.

## 2023-10-23 NOTE — Progress Notes (Signed)
 Heart Failure Navigator Progress Note  Patient is currently both an Advanced Heart Failure Team establish patient of [name], FNP  and [facility]. Patient will follow-up within a week of hospital discharge with [facility] for this readmission.  Navigator available for reassessment of patient but will sign off at this time.  [name], RN, BSN [facility] Heart Failure Navigator Secure Chat Only

## 2023-10-23 NOTE — ED Notes (Signed)
 Assumed patient care and received report from the previous nurse.

## 2023-10-23 NOTE — Evaluation (Signed)
 Physical Therapy Evaluation Patient Details Name: Leslie Parker MRN: [id number] DOB: 08/09/1932 Today's Date: 10/23/2023  History of Present Illness  Leslie Parker is an 89yoF who comes to [facility] with SOB, found to be in volume overload, BNP>4500. Pt recently here for hypoxia 2 weeks ago. PMH: CHF, leg weakness trhat precludes AMB community distances followed by orthopedics for this.  Clinical Impression  Pt in bed on entry, meal completed. Pt is now on room air, saturations >90% throughout session. Pt moves well to EOB, then able to complete 7 transfers to standing during my assessment. Pt takes some small steps at bedside. Pt reports legs feel close to recent baseline regarding strength. Unable to assess AMB at this time, however pt has chronic AMB limitations related to her back. Will continue to follow.       If plan is discharge home, recommend the following: A little help with walking and/or transfers;A little help with bathing/dressing/bathroom   Can travel by private vehicle        Equipment Recommendations None recommended by PT  Recommendations for Other Services       Functional Status Assessment Patient has had a recent decline in their functional status and demonstrates the ability to make significant improvements in function in a reasonable and predictable amount of time.     Precautions / Restrictions Precautions Precautions: Fall Restrictions Weight Bearing Restrictions Per Provider Order: No      Mobility  Bed Mobility Overal bed mobility: Needs Assistance Bed Mobility: Supine to Sit     Supine to sit: Supervision          Transfers Overall transfer level: Needs assistance Equipment used: None (BSC, chairback) Transfers: Sit to/from Stand Sit to Stand: Supervision           General transfer comment: seevral STS transfers at bedside, then takes several small steps up toward [facility].    Ambulation/Gait Ambulation/Gait assistance:  (deferred, no RW  avaialble in ED, also appears to be moving quite well with tranfers and close to baseline.)                Stairs            Wheelchair Mobility     Tilt Bed    Modified Rankin (Stroke Patients Only)       Balance                                             Pertinent Vitals/Pain      Home Living Family/patient expects to be discharged to:: Private residence Living Arrangements: Alone Available Help at Discharge: Family (grandDTR works 5a-1p, helps with transportation, groceries; DTR in [place], Son with [place] in [place];) Type of Home: Apartment Home Access: Level entry       Home Layout: One level Home Equipment: Rollator (4 wheels);Cane - single point;Grab bars - tub/shower;Grab bars - toilet;Lift chair;Transport chair;Tub bench;Rolling Walker (2 wheels);Shower seat      Prior Function               Mobility Comments: Mod Ind amb in her senior living apt with a rollator, family assists with transport chair for community access, no falls in the last 6 months with last fall being in July of 2024.  Denies home O2 ADLs Comments: Seated shower, mod indep with dressing, indep with meals, meds. Family assists with  groceries, driving, trash, and mail     Extremity/Trunk Assessment                Communication        Cognition   Behavior During Therapy: [facility] for tasks assessed/performed   PT - Cognitive impairments: No apparent impairments                                 Cueing       General Comments      Exercises Other Exercises Other Exercises: STS x5 from elevated surface 2 hand support Other Exercises: sustained standng  x 30sec Other Exercises: lateral side step alone EOB, 2 hand support   Assessment/Plan    PT Assessment Patient needs continued PT services  PT Problem List Decreased strength;Decreased range of motion;Decreased balance;Cardiopulmonary status limiting activity;Decreased  coordination;Decreased mobility;Decreased knowledge of precautions;Decreased safety awareness;Decreased activity tolerance;Decreased knowledge of use of DME       PT Treatment Interventions DME instruction;Gait training;Functional mobility training;Therapeutic activities;Therapeutic exercise;Balance training;Patient/family education    PT Goals (Current goals can be found in the Care Plan section)  Acute Rehab PT Goals Patient Stated Goal: get this fluid offa me PT Goal Formulation: With patient Time For Goal Achievement: 11/06/23 Potential to Achieve Goals: Good    Frequency Min 2X/week     Co-evaluation               AM-PAC PT "6 Clicks" Mobility  Outcome Measure Help needed turning from your back to your side while in a flat bed without using bedrails?: None Help needed moving from lying on your back to sitting on the side of a flat bed without using bedrails?: None Help needed moving to and from a bed to a chair (including a wheelchair)?: A Little Help needed standing up from a chair using your arms (e.g., wheelchair or bedside chair)?: A Little Help needed to walk in hospital room?: A Little Help needed climbing 3-5 steps with a railing? : A Little 6 Click Score: 20    End of Session   Activity Tolerance: Patient tolerated treatment well;No increased pain Patient left: with call bell/phone within reach;in bed;with nursing/sitter in room Nurse Communication: Mobility status PT Visit Diagnosis: Muscle weakness (generalized) (M62.81);Difficulty in walking, not elsewhere classified (R26.2)    Time: 1610-9604 PT Time Calculation (min) (ACUTE ONLY): 17 min   Charges:   PT Evaluation $PT Eval Moderate Complexity: 1 Mod PT Treatments $Therapeutic Activity: 8-22 mins PT General Charges $$ ACUTE PT VISIT: 1 Visit        12:23 PM, 10/23/23 Rosamaria Lints, PT, DPT Physical Therapist - [facility]  [phone] (ASCOM)     Astoria Condon C 10/23/2023, 12:21 PM

## 2023-10-23 NOTE — Evaluation (Signed)
 Occupational Therapy Evaluation Patient Details Name: Leslie Parker MRN: [id number] DOB: 05-Dec-1932 Today's Date: 10/23/2023   History of Present Illness   Jennye Runquist is an 89yoF who comes to [facility] with SOB, found to be in volume overload, BNP>4500. Pt recently here for hypoxia 2 weeks ago. PMH: CHF, leg weakness trhat precludes AMB community distances followed by orthopedics for this.     Clinical Impressions Patient presenting with decreased Ind in self care,balance, functional mobility/transfers, endurance, and safety awareness. Patient reports being Mod I with use of rollator at baseline. She lives at home alone in a senior living apartment. She endorses her daughter is now staying with her for the next few months to assist as needed.  Patient currently functioning at supervision- CGA but fatigues quickly and reports feeling SOB but vitals are WNLs. Patient will benefit from acute OT to increase overall independence in the areas of ADLs, functional mobility, and safety awareness in order to safely discharge.     If plan is discharge home, recommend the following:   A little help with walking and/or transfers;A little help with bathing/dressing/bathroom;Assistance with cooking/housework;Assist for transportation;Help with stairs or ramp for entrance     Functional Status Assessment   Patient has had a recent decline in their functional status and demonstrates the ability to make significant improvements in function in a reasonable and predictable amount of time.     Equipment Recommendations   None recommended by OT      Precautions/Restrictions   Precautions Precautions: Fall     Mobility Bed Mobility Overal bed mobility: Needs Assistance Bed Mobility: Supine to Sit     Supine to sit: Supervision          Transfers Overall transfer level: Needs assistance Equipment used: 1 person hand held assist Transfers: Sit to/from Stand Sit to Stand: Supervision                   Balance Overall balance assessment: Needs assistance Sitting-balance support: No upper extremity supported, Feet supported Sitting balance-[name]: Good                                     ADL either performed or assessed with clinical judgement   ADL Overall ADL's : Needs assistance/impaired                                       General ADL Comments: min A to doff home gown and don hospital gown     Vision Patient Visual Report: No change from baseline              Pertinent Vitals/Pain Pain Assessment Pain Assessment: No/denies pain     Extremity/Trunk Assessment Upper Extremity Assessment Upper Extremity Assessment: Generalized weakness   Lower Extremity Assessment Lower Extremity Assessment: Generalized weakness       Communication Communication Communication: Impaired Factors Affecting Communication: Hearing impaired   Cognition Arousal: Alert Behavior During Therapy: WFL for tasks assessed/performed                                 Following commands: Intact       Cueing  General Comments   Cueing Techniques: Verbal cues  Home Living Family/patient expects to be discharged to:: Private residence Living Arrangements: Alone Available Help at Discharge: Family Type of Home: Apartment Home Access: Level entry     Home Layout: One level     Bathroom Shower/Tub: Producer, television/film/video: Handicapped height Bathroom Accessibility: Yes   Home Equipment: Rollator (4 wheels);Cane - single point;Grab bars - tub/shower;Grab bars - toilet;Lift chair;Transport chair;Tub bench;Rolling Walker (2 wheels);Shower seat          Prior Functioning/Environment Prior Level of Function : Needs assist             Mobility Comments: Mod Ind amb in her senior living apt with a rollator, family assists with transport chair for community access, no falls in the last 6  months with last fall being in July of 2024.  Denies home O2 ADLs Comments: Seated shower, mod indep with dressing, indep with meals, meds. Family assists with groceries, driving, trash, and mail. Pt reports daughter will be staying with her for several months at discharge.    OT Problem List: Decreased strength;Decreased activity tolerance;Decreased safety awareness;Impaired balance (sitting and/or standing);Decreased knowledge of use of DME or AE   OT Treatment/Interventions: Self-care/ADL training;Therapeutic exercise;Therapeutic activities;Energy conservation;DME and/or AE instruction;Patient/family education;Balance training      OT Goals(Current goals can be found in the care plan section)   Acute Rehab OT Goals Patient Stated Goal: to go home OT Goal Formulation: With patient Time For Goal Achievement: 11/06/23 Potential to Achieve Goals: Fair ADL Goals Pt Will Perform Grooming: with modified independence;standing Pt Will Perform Lower Body Dressing: with modified independence;sit to/from stand Pt Will Transfer to Toilet: with modified independence;ambulating Pt Will Perform Toileting - Clothing Manipulation and hygiene: with modified independence;sit to/from stand   OT Frequency:  Min 2X/week       AM-PAC OT "6 Clicks" Daily Activity     Outcome Measure Help from another person eating meals?: None Help from another person taking care of personal grooming?: None Help from another person toileting, which includes using toliet, bedpan, or urinal?: A Little Help from another person bathing (including washing, rinsing, drying)?: A Little Help from another person to put on and taking off regular upper body clothing?: A Little Help from another person to put on and taking off regular lower body clothing?: A Little 6 Click Score: 20   End of Session Nurse Communication: Mobility status  Activity Tolerance: Patient limited by fatigue Patient left: in bed  OT Visit Diagnosis:  Unsteadiness on feet (R26.81);Muscle weakness (generalized) (M62.81)                Time: 1000-1015 OT Time Calculation (min): 15 min Charges:  OT General Charges $OT Visit: 1 Visit OT Evaluation $OT Eval Low Complexity: 1 Low OT Treatments $Self Care/Home Management : 8-22 mins  Jackquline Denmark, MS, OTR/L , CBIS ascom [phone]  10/23/23, 1:52 PM

## 2023-10-23 NOTE — Progress Notes (Signed)
 PROGRESS NOTE    Leslie Parker  WUJ:[id number] DOB: [birth year] DOA: 10/22/2023 PCP: [name], MD    Brief Narrative:   89 year old female with history of CKD stage IV, severe heart failure reduced ejection fraction, hypertension, non-insulin-dependent diabetes mellitus, GERD, history of diverticulosis with diverticulitis requiring hospitalization, recent frequent hospitalization for heart failure exacerbation, who presents to the emergency department for chief turns of shortness of breath.   Per documentation, patient had SpO2 of 80% on room air and was ultimately placed on CPAP at the time of arrival, blood pressure 200s over 120s.  Patient received 2 doses the nitroglycerin paste, enalapril 1.25 mg IV one-time dose.   She states that on October 16, 2023, her daughter weighed her and she was 169 pounds and yesterday her daughter states that she was 172 pounds.   She is worsening swelling of her lower extremities and shortness of breath, worse with exertion and with laying flat.  She denies trauma to her person, chest pain, abdominal pain, dysuria, hematuria, diarrhea, blood in her stool.  She denies fever, nausea, vomiting. Assessment & Plan:   Principal Problem:   Acute exacerbation of CHF (congestive heart failure) (HCC) Active Problems:   COPD (chronic obstructive pulmonary disease) (HCC)   Sick sinus syndrome (HCC)   Pleural effusion due to CHF (congestive heart failure) (HCC)   Paroxysmal atrial fibrillation (HCC)   Hypokalemia   Hypomagnesemia   Type II diabetes mellitus with renal manifestations (HCC)   CKD (chronic kidney disease) stage 4, GFR 15-29 ml/min (HCC)   Elevated troponin  * Acute exacerbation of systolic CHF (congestive heart failure) (HCC)  Suspect secondary to nonadherence with fluid restriction in the setting of markedly reduced ejection fraction Plan: Cardiac telemetry Fluid restrict IV Lasix Monitor ins and outs Daily weights  Sick sinus  syndrome (HCC) Status post pacemaker placement   COPD (chronic obstructive pulmonary disease) (HCC) Gold stage IV. Not in acute exacerbation. Resume home nebs   Paroxysmal atrial fibrillation (HCC) Home Eliquis 2.5 mg p.o. twice daily resumed Coreg 6.25 mg p.o. twice daily resumed   CKD (chronic kidney disease) stage 4, GFR 15-29 ml/min (HCC) Currently at baseline   Type II diabetes mellitus with renal manifestations (HCC) Home glipizide will not be resumed on admission Insulin SSI, renal dosing, with at bedtime coverage ordered on admission    Elevated troponin Query demand ischemia in setting of severe heart failure exacerbation No chest pain.  No significant delta   DVT prophylaxis: Eliquis Code Status: Full Family Communication: Daughter at bedside 4/2 Disposition Plan: Status is: Inpatient Remains inpatient appropriate because: Acute exacerbation of systolic congestive heart failure   Level of care: Telemetry Cardiac  Consultants:  None  Procedures:  None  Antimicrobials: None   Subjective: Seen and examined.  Resting in bed.  Mild conversational dyspnea.  No pain complaints.  Objective: Vitals:   10/23/23 1200 10/23/23 1230 10/23/23 1300 10/23/23 1330  BP: (!) 160/65 (!) 137/92 (!) 153/70 (!) 158/74  Pulse: 60 63 (!) 58 61  Resp: 19 17 13 18   Temp:      TempSrc:      SpO2: 96% 95% 97% 99%  Weight:      Height:        Intake/Output Summary (Last 24 hours) at 10/23/2023 1426 Last data filed at 10/22/2023 1717 Gross per 24 hour  Intake --  Output 550 ml  Net -550 ml   Filed Weights   10/22/23 0902  Weight: 80 kg  Examination:  General exam: Appears calm and comfortable  Respiratory system: Bibasilar crackles.  Normal work of breathing.  1 L Cardiovascular system: S1-S2, 2/6 murmur, 3+ pitting edema to level of knee Gastrointestinal system: Soft, NT ND, normal bowel sounds Central nervous system: Alert and oriented. No focal neurological  deficits. Extremities: Symmetric 5 x 5 power. Skin: No rashes, lesions or ulcers Psychiatry: Judgement and insight appear normal. Mood & affect appropriate.     Data Reviewed: I have personally reviewed following labs and imaging studies  CBC: Recent Labs  Lab 10/22/23 0857 10/23/23 0644  WBC 6.9 6.5  HGB 12.1 10.3*  HCT 36.3 29.8*  MCV 92.6 92.0  PLT 228 193   Basic Metabolic Panel: Recent Labs  Lab 10/22/23 0857 10/22/23 1407 10/23/23 0644  NA 137  --  139  K 3.4*  --  3.1*  CL 101  --  102  CO2 24  --  26  GLUCOSE 181*  --  104*  BUN 57*  --  53*  CREATININE 2.20*  --  1.95*  CALCIUM 8.6*  --  8.4*  MG  --  1.6*  --    GFR: Estimated Creatinine Clearance: 19.6 mL/min (A) (by C-G formula based on SCr of 1.95 mg/dL (H)). Liver Function Tests: Recent Labs  Lab 10/22/23 0857  AST 70*  ALT 68*  ALKPHOS 109  BILITOT 1.0  PROT 6.5  ALBUMIN 3.3*   No results for input(s): "LIPASE", "AMYLASE" in the last 168 hours. No results for input(s): "AMMONIA" in the last 168 hours. Coagulation Profile: No results for input(s): "INR", "PROTIME" in the last 168 hours. Cardiac Enzymes: No results for input(s): "CKTOTAL", "CKMB", "CKMBINDEX", "TROPONINI" in the last 168 hours. BNP (last 3 results) No results for input(s): "PROBNP" in the last 8760 hours. HbA1C: No results for input(s): "HGBA1C" in the last 72 hours. CBG: Recent Labs  Lab 10/22/23 1748 10/22/23 2149 10/23/23 0736 10/23/23 1137  GLUCAP 132* 104* 105* 123*   Lipid Profile: No results for input(s): "CHOL", "HDL", "LDLCALC", "TRIG", "CHOLHDL", "LDLDIRECT" in the last 72 hours. Thyroid Function Tests: Recent Labs    10/22/23 1407  TSH 3.395   Anemia Panel: No results for input(s): "VITAMINB12", "FOLATE", "FERRITIN", "TIBC", "IRON", "RETICCTPCT" in the last 72 hours. Sepsis Labs: No results for input(s): "PROCALCITON", "LATICACIDVEN" in the last 168 hours.  Recent Results (from the past 240  hours)  Resp panel by RT-PCR (RSV, Flu A&B, Covid) Anterior Nasal Swab     Status: None   Collection Time: 10/22/23  9:28 AM   Specimen: Anterior Nasal Swab  Result Value Ref Range Status   SARS Coronavirus 2 by RT PCR NEGATIVE NEGATIVE Final    Comment: (NOTE) SARS-CoV-2 target nucleic acids are NOT DETECTED.  The SARS-CoV-2 RNA is generally detectable in upper respiratory specimens during the acute phase of infection. The lowest concentration of SARS-CoV-2 viral copies this assay can detect is 138 copies/mL. A negative result does not preclude SARS-Cov-2 infection and should not be used as the sole basis for treatment or other patient management decisions. A negative result may occur with  improper specimen collection/handling, submission of specimen other than nasopharyngeal swab, presence of viral mutation(s) within the areas targeted by this assay, and inadequate number of viral copies(<138 copies/mL). A negative result must be combined with clinical observations, patient history, and epidemiological information. The expected result is Negative.  Fact Sheet for Patients:  BloggerCourse.com  Fact Sheet for Healthcare Providers:  SeriousBroker.it  This  test is no t yet approved or cleared by the [place] and  has been authorized for detection and/or diagnosis of SARS-CoV-2 by FDA under an Emergency Use Authorization (EUA). This EUA will remain  in effect (meaning this test can be used) for the duration of the COVID-19 declaration under Section 564(b)(1) of the Act, 21 U.S.C.section 360bbb-3(b)(1), unless the authorization is terminated  or revoked sooner.       Influenza A by PCR NEGATIVE NEGATIVE Final   Influenza B by PCR NEGATIVE NEGATIVE Final    Comment: (NOTE) The Xpert Xpress SARS-CoV-2/FLU/RSV plus assay is intended as an aid in the diagnosis of influenza from Nasopharyngeal swab specimens and should not  be used as a sole basis for treatment. Nasal washings and aspirates are unacceptable for Xpert Xpress SARS-CoV-2/FLU/RSV testing.  Fact Sheet for Patients: BloggerCourse.com  Fact Sheet for Healthcare Providers: SeriousBroker.it  This test is not yet approved or cleared by the [place] FDA and has been authorized for detection and/or diagnosis of SARS-CoV-2 by FDA under an Emergency Use Authorization (EUA). This EUA will remain in effect (meaning this test can be used) for the duration of the COVID-19 declaration under Section 564(b)(1) of the Act, 21 U.S.C. section 360bbb-3(b)(1), unless the authorization is terminated or revoked.     Resp Syncytial Virus by PCR NEGATIVE NEGATIVE Final    Comment: (NOTE) Fact Sheet for Patients: BloggerCourse.com  Fact Sheet for Healthcare Providers: SeriousBroker.it  This test is not yet approved or cleared by the [place] FDA and has been authorized for detection and/or diagnosis of SARS-CoV-2 by FDA under an Emergency Use Authorization (EUA). This EUA will remain in effect (meaning this test can be used) for the duration of the COVID-19 declaration under Section 564(b)(1) of the Act, 21 U.S.C. section 360bbb-3(b)(1), unless the authorization is terminated or revoked.  Performed at [facility], [address], [place], [place] [address]          Radiology Studies: [facility] Chest [place] 1 View Result Date: 10/22/2023 CLINICAL DATA:  [id number] Pain [id number].  Shortness of breath. EXAM: PORTABLE CHEST 1 VIEW COMPARISON:  10/12/2023. FINDINGS: Moderate diffuse pulmonary vascular congestion with bilateral layering pleural effusions, left greater than right, grossly similar to the prior study. There probable underlying compressive atelectatic changes at the lung bases. Bilateral lung fields are otherwise clear. No pneumothorax.  Evaluation of cardiomediastinal silhouette is nondiagnostic due to left lower hemithorax opacification. There is a left sided 2-lead pacemaker. No acute osseous abnormalities. Right reverse shoulder arthroplasty noted. The soft tissues are within normal limits. IMPRESSION: Moderate pulmonary vascular congestion with bilateral layering pleural effusions, left greater than right, grossly similar to the prior study and compatible with congestive heart failure/pulmonary edema. Electronically Signed   By: Jules Schick M.D.   On: 10/22/2023 10:49        Scheduled Meds:  apixaban  2.5 mg Oral BID   ascorbic acid  250 mg Oral Daily   carvedilol  6.25 mg Oral BID WC   cholecalciferol  1,000 Units Oral Daily   fluticasone furoate-vilanterol  1 puff Inhalation Daily   And   umeclidinium bromide  1 puff Inhalation Daily   furosemide  40 mg Intravenous BID   hydrALAZINE  50 mg Oral TID   insulin aspart  0-5 Units Subcutaneous QHS   insulin aspart  0-9 Units Subcutaneous TID WC   isosorbide mononitrate  30 mg Oral QHS   multivitamin with minerals  1 tablet Oral  Daily   pantoprazole  40 mg Oral Daily   traZODone  100 mg Oral QHS   Continuous Infusions:   LOS: 1 day      Tresa Moore, MD [facility]   If 7PM-7AM, please contact night-coverage  10/23/2023, 2:26 PM

## 2023-10-24 ENCOUNTER — Other Ambulatory Visit: Payer: Self-pay

## 2023-10-24 DIAGNOSIS — I5023 Acute on chronic systolic (congestive) heart failure: Secondary | ICD-10-CM | POA: Diagnosis not present

## 2023-10-24 LAB — BASIC METABOLIC PANEL WITH GFR
Anion gap: 10 (ref 5–15)
BUN: 48 mg/dL — ABNORMAL HIGH (ref 8–23)
CO2: 26 mmol/L (ref 22–32)
Calcium: 8.4 mg/dL — ABNORMAL LOW (ref 8.9–10.3)
Chloride: 103 mmol/L (ref 98–111)
Creatinine, Ser: 1.98 mg/dL — ABNORMAL HIGH (ref 0.44–1.00)
GFR, Estimated: 24 mL/min — ABNORMAL LOW (ref >60)
Glucose, Bld: 90 mg/dL (ref 70–99)
Potassium: 3.6 mmol/L (ref 3.5–5.1)
Sodium: 139 mmol/L (ref 135–145)

## 2023-10-24 LAB — GLUCOSE, CAPILLARY
Glucose-Capillary: 104 mg/dL — ABNORMAL HIGH (ref 70–99)
Glucose-Capillary: 103 mg/dL — ABNORMAL HIGH (ref 70–99)

## 2023-10-24 LAB — MAGNESIUM: Magnesium: 1.8 mg/dL (ref 1.7–2.4)

## 2023-10-24 MED ORDER — TORSEMIDE 20 MG PO TABS
20.0000 mg | ORAL_TABLET | Freq: Every day | ORAL | 0 refills | Status: DC
  Filled 2023-10-24: qty 30, 30d supply, fill #0

## 2023-10-24 NOTE — Discharge Summary (Signed)
 Physician Discharge Summary  Leslie Parker:[id number] DOB: 05-07-1933 DOA: 10/22/2023  PCP: [name], MD  Admit date: 10/22/2023 Discharge date: 10/24/2023  Admitted From: Home Disposition:  Home w home health  Recommendations for Outpatient Follow-up:  Follow up with PCP in 1-2 weeks Follow-up with heart failure clinic within 1 week of discharge  Home Health: Yes PT OT RN Equipment/Devices: None  Discharge Condition: Stable CODE STATUS: Full Diet recommendation: Heart healthy with 1500 cc fluid restriction  Brief/Interim Summary:   89 year old female with history of CKD stage IV, severe heart failure reduced ejection fraction, hypertension, non-insulin-dependent diabetes mellitus, GERD, history of diverticulosis with diverticulitis requiring hospitalization, recent frequent hospitalization for heart failure exacerbation, who presents to the emergency department for chief turns of shortness of breath.   Per documentation, patient had SpO2 of 80% on room air and was ultimately placed on CPAP at the time of arrival, blood pressure 200s over 120s.  Patient received 2 doses the nitroglycerin paste, enalapril 1.25 mg IV one-time dose.   She states that on October 16, 2023, her daughter weighed her and she was 169 pounds and yesterday her daughter states that she was 172 pounds.   She is worsening swelling of her lower extremities and shortness of breath, worse with exertion and with laying flat.  She denies trauma to her person, chest pain, abdominal pain, dysuria, hematuria, diarrhea, blood in her stool.  She denies fever, nausea, vomiting.  Diuresed 3 L while on IV diuresis.  I suspect dietary and fluid restriction indiscretion is primary driver for patient's recurrent admissions.  Educated patient at length at bedside.  Educated daughter as well.  Recommend restricting total fluid intake to 1 1-1.5 L daily.  Patient will follow-up in heart failure clinic.  She is an established  patient.  At time of discharge will recommend increasing home torsemide dose to 20 mg daily.   Discharge Diagnoses:  Principal Problem:   Acute exacerbation of CHF (congestive heart failure) (HCC) Active Problems:   COPD (chronic obstructive pulmonary disease) (HCC)   Sick sinus syndrome (HCC)   Pleural effusion due to CHF (congestive heart failure) (HCC)   Paroxysmal atrial fibrillation (HCC)   Hypokalemia   Hypomagnesemia   Type II diabetes mellitus with renal manifestations (HCC)   CKD (chronic kidney disease) stage 4, GFR 15-29 ml/min (HCC)   Elevated troponin   Acute exacerbation of systolic CHF (congestive heart failure) (HCC) Suspect secondary to nonadherence with fluid restriction in the setting of markedly reduced ejection fraction Plan: Diuresed 3 L in house.  Approaching baseline at time of discharge.  Some residual edema in the bilateral lower extremities which patient states is at or slightly better than her baseline.  Increase home torsemide dose to 20 mg daily.  Patient will follow-up in heart failure clinic within 1 week of discharge.  Discharge Instructions  Discharge Instructions     Diet - low sodium heart healthy   Complete by: As directed    Increase activity slowly   Complete by: As directed       Allergies as of 10/24/2023       Reactions   Baclofen Other (See Comments)   Elevated Cr   Simvastatin Other (See Comments)   Pt denies Elevated LFTs with simva 40mg , stopped and resolved (see MD note 11/10/13)        Medication List     TAKE these medications    acetaminophen 325 MG tablet Commonly known as: TYLENOL Take 2 tablets (  650 mg total) by mouth every 4 (four) hours as needed for headache or mild pain (pain score 1-3).   albuterol 108 (90 Base) MCG/ACT inhaler Commonly known as: VENTOLIN HFA Inhale 2 puffs into the lungs every 6 (six) hours as needed.   amiodarone 200 MG tablet Commonly known as: PACERONE Take 1 tablet by mouth  daily.   apixaban 2.5 MG Tabs tablet Commonly known as: ELIQUIS Take 1 tablet (2.5 mg total) by mouth 2 (two) times daily.   ascorbic acid 250 MG Chew Commonly known as: VITAMIN C Chew 250 mg by mouth daily.   B-12 2500 MCG Tabs Take 2,500 mcg by mouth daily.   carvedilol 6.25 MG tablet Commonly known as: COREG Take 1 tablet (6.25 mg total) by mouth 2 (two) times daily with a meal.   glipiZIDE 2.5 MG 24 hr tablet Commonly known as: GLUCOTROL XL Take 2.5 mg by mouth daily.   hydrALAZINE 50 MG tablet Commonly known as: APRESOLINE Take 1 tablet (50 mg total) by mouth 3 (three) times daily. What changed:  when to take this reasons to take this   ipratropium-albuterol 0.5-2.5 (3) MG/3ML Soln Commonly known as: DUONEB Inhale 3 mLs into the lungs every 6 (six) hours as needed.   isosorbide mononitrate 30 MG 24 hr tablet Commonly known as: IMDUR Take 1 tablet (30 mg total) by mouth at bedtime.   Magnesium Oxide -Mg Supplement 500 MG Caps Take 500 mg by mouth daily.   metoprolol tartrate 25 MG tablet Commonly known as: LOPRESSOR Take 25 mg by mouth 2 (two) times daily.   multivitamin with minerals Tabs tablet Take 1 tablet by mouth daily. One-A-Day Women's Vitamin   torsemide 20 MG tablet Commonly known as: DEMADEX Take 1 tablet (20 mg total) by mouth daily. What changed:  medication strength how much to take   traZODone 100 MG tablet Commonly known as: DESYREL Take 100 mg by mouth at bedtime.   Trelegy Ellipta 100-62.5-25 MCG/ACT Aepb Generic drug: Fluticasone-Umeclidin-Vilant Inhale 1 puff into the lungs daily.   Vitamin D-1000 Max St 25 MCG (1000 UT) tablet Generic drug: Cholecalciferol Take 1,000 Units by mouth daily.        Follow-up Information     [facility]. Go to.   Why: Appointment scheduled for Heart Failure Clinic at [facility] 10/29/2023 at 12 PM        Shamrock, Donnita [place], MD. Schedule an appointment as soon as possible for a  visit in 1 week(s).   Specialty: Internal Medicine Why: Have patient book follow up appointment. Contact information: [address] [place] [address] [phone]                Allergies  Allergen Reactions   Baclofen Other (See Comments)    Elevated Cr   Simvastatin Other (See Comments)    Pt denies  Elevated LFTs with simva 40mg , stopped and resolved (see MD note 11/10/13)    Consultations: None   Procedures/Studies: DG Chest Port 1 View Result Date: 10/22/2023 CLINICAL DATA:  [id number] Pain [id number].  Shortness of breath. EXAM: PORTABLE CHEST 1 VIEW COMPARISON:  10/12/2023. FINDINGS: Moderate diffuse pulmonary vascular congestion with bilateral layering pleural effusions, left greater than right, grossly similar to the prior study. There probable underlying compressive atelectatic changes at the lung bases. Bilateral lung fields are otherwise clear. No pneumothorax. Evaluation of cardiomediastinal silhouette is nondiagnostic due to left lower hemithorax opacification. There is a left sided 2-lead pacemaker. No acute osseous abnormalities.  Right reverse shoulder arthroplasty noted. The soft tissues are within normal limits. IMPRESSION: Moderate pulmonary vascular congestion with bilateral layering pleural effusions, left greater than right, grossly similar to the prior study and compatible with congestive heart failure/pulmonary edema. Electronically Signed   By: Jules Schick M.D.   On: 10/22/2023 10:49   DG Chest 1 View Result Date: 10/12/2023 CLINICAL DATA:  CHF. EXAM: CHEST  1 VIEW COMPARISON:  10/11/2023 FINDINGS: The cardio pericardial silhouette is enlarged. Bibasilar atelectasis or infiltrate is progressive in the interval with small to moderate left and small right pleural effusions stable to minimally progressive since prior. Left-sided permanent pacemaker noted. Bones diffusely demineralized. Status post right shoulder replacement. IMPRESSION: Progressive  bibasilar atelectasis or infiltrate with small to moderate left and small right pleural effusions. Electronically Signed   By: Kennith Center M.D.   On: 10/12/2023 06:57   DG Chest Portable 1 View Result Date: 10/11/2023 CLINICAL DATA:  Shortness of breath. EXAM: PORTABLE CHEST 1 VIEW COMPARISON:  09/15/2023. FINDINGS: Low lung volume. Moderate diffuse pulmonary vascular congestion and bilateral small layering pleural effusions. There are additional left retrocardiac opacities, which are similar to the prior study and may represent left lung atelectasis and/or consolidation. Bilateral lung fields are otherwise clear. No pneumothorax. Stable mildly enlarged cardio-mediastinal silhouette. There is a left sided 2-lead pacemaker. No acute osseous abnormalities. Right reverse shoulder arthroplasty noted. The soft tissues are within normal limits. IMPRESSION: Findings favor congestive heart failure/pulmonary edema. Electronically Signed   By: Jules Schick M.D.   On: 10/11/2023 10:46      Subjective: Seen and examined on the day of discharge.  Stable no distress.  Appropriate discharge home.  Discharge Exam: Vitals:   10/24/23 0400 10/24/23 1218  BP: (!) 158/66 (!) 173/76  Pulse: 65 65  Resp: (!) 22   Temp: 98.1 F (36.7 C) 97.8 F (36.6 C)  SpO2: 94% 90%   Vitals:   10/24/23 0021 10/24/23 0400 10/24/23 0600 10/24/23 1218  BP: (!) 159/62 (!) 158/66  (!) 173/76  Pulse: 67 65  65  Resp: 20 (!) 22    Temp: 98.1 F (36.7 C) 98.1 F (36.7 C)  97.8 F (36.6 C)  TempSrc: Oral Oral    SpO2: 95% 94%  90%  Weight:   79.2 kg   Height:        General: Pt is alert, awake, not in acute distress Cardiovascular: RRR, S1/S2 +, no rubs, no gallops Respiratory: CTA bilaterally, no wheezing, no rhonchi Abdominal: Soft, NT, ND, bowel sounds + Extremities: no edema, no cyanosis    The results of significant diagnostics from this hospitalization (including imaging, microbiology, ancillary and  laboratory) are listed below for reference.     Microbiology: Recent Results (from the past 240 hours)  Resp panel by RT-PCR (RSV, Flu A&B, Covid) Anterior Nasal Swab     Status: None   Collection Time: 10/22/23  9:28 AM   Specimen: Anterior Nasal Swab  Result Value Ref Range Status   SARS Coronavirus 2 by RT PCR NEGATIVE NEGATIVE Final    Comment: (NOTE) SARS-CoV-2 target nucleic acids are NOT DETECTED.  The SARS-CoV-2 RNA is generally detectable in upper respiratory specimens during the acute phase of infection. The lowest concentration of SARS-CoV-2 viral copies this assay can detect is 138 copies/mL. A negative result does not preclude SARS-Cov-2 infection and should not be used as the sole basis for treatment or other patient management decisions. A negative result may occur with  improper  specimen collection/handling, submission of specimen other than nasopharyngeal swab, presence of viral mutation(s) within the areas targeted by this assay, and inadequate number of viral copies(<138 copies/mL). A negative result must be combined with clinical observations, patient history, and epidemiological information. The expected result is Negative.  Fact Sheet for Patients:  BloggerCourse.com  Fact Sheet for Healthcare Providers:  SeriousBroker.it  This test is no t yet approved or cleared by the [place] FDA and  has been authorized for detection and/or diagnosis of SARS-CoV-2 by FDA under an Emergency Use Authorization (EUA). This EUA will remain  in effect (meaning this test can be used) for the duration of the COVID-19 declaration under Section 564(b)(1) of the Act, 21 U.S.C.section 360bbb-3(b)(1), unless the authorization is terminated  or revoked sooner.       Influenza A by PCR NEGATIVE NEGATIVE Final   Influenza B by PCR NEGATIVE NEGATIVE Final    Comment: (NOTE) The Xpert Xpress SARS-CoV-2/FLU/RSV plus assay is  intended as an aid in the diagnosis of influenza from Nasopharyngeal swab specimens and should not be used as a sole basis for treatment. Nasal washings and aspirates are unacceptable for Xpert Xpress SARS-CoV-2/FLU/RSV testing.  Fact Sheet for Patients: BloggerCourse.com  Fact Sheet for Healthcare Providers: SeriousBroker.it  This test is not yet approved or cleared by the [place] FDA and has been authorized for detection and/or diagnosis of SARS-CoV-2 by FDA under an Emergency Use Authorization (EUA). This EUA will remain in effect (meaning this test can be used) for the duration of the COVID-19 declaration under Section 564(b)(1) of the Act, 21 U.S.C. section 360bbb-3(b)(1), unless the authorization is terminated or revoked.     Resp Syncytial Virus by PCR NEGATIVE NEGATIVE Final    Comment: (NOTE) Fact Sheet for Patients: BloggerCourse.com  Fact Sheet for Healthcare Providers: SeriousBroker.it  This test is not yet approved or cleared by the [place] FDA and has been authorized for detection and/or diagnosis of SARS-CoV-2 by FDA under an Emergency Use Authorization (EUA). This EUA will remain in effect (meaning this test can be used) for the duration of the COVID-19 declaration under Section 564(b)(1) of the Act, 21 U.S.C. section 360bbb-3(b)(1), unless the authorization is terminated or revoked.  Performed at [facility], [address] [place], [place] [address]      Labs: BNP (last 3 results) Recent Labs    09/15/23 0646 10/11/23 1026 10/22/23 0857  BNP >4,500.0* >4,500.0* >4,500.0*   Basic Metabolic Panel: Recent Labs  Lab 10/22/23 0857 10/22/23 1407 10/23/23 0644 10/24/23 0825  NA 137  --  139 139  K 3.4*  --  3.1* 3.6  CL 101  --  102 103  CO2 24  --  26 26  GLUCOSE 181*  --  104* 90  BUN 57*  --  53* 48*  CREATININE 2.20*   --  1.95* 1.98*  CALCIUM 8.6*  --  8.4* 8.4*  MG  --  1.6*  --  1.8   Liver Function Tests: Recent Labs  Lab 10/22/23 0857  AST 70*  ALT 68*  ALKPHOS 109  BILITOT 1.0  PROT 6.5  ALBUMIN 3.3*   No results for input(s): "LIPASE", "AMYLASE" in the last 168 hours. No results for input(s): "AMMONIA" in the last 168 hours. CBC: Recent Labs  Lab 10/22/23 0857 10/23/23 0644  WBC 6.9 6.5  HGB 12.1 10.3*  HCT 36.3 29.8*  MCV 92.6 92.0  PLT 228 193   Cardiac Enzymes: No results for  input(s): "CKTOTAL", "CKMB", "CKMBINDEX", "TROPONINI" in the last 168 hours. BNP: Invalid input(s): "POCBNP" CBG: Recent Labs  Lab 10/23/23 1137 10/23/23 1702 10/23/23 2132 10/24/23 0837 10/24/23 1220  GLUCAP 123* 110* 108* 104* 103*   D-Dimer No results for input(s): "DDIMER" in the last 72 hours. Hgb A1c No results for input(s): "HGBA1C" in the last 72 hours. Lipid Profile No results for input(s): "CHOL", "HDL", "LDLCALC", "TRIG", "CHOLHDL", "LDLDIRECT" in the last 72 hours. Thyroid function studies Recent Labs    10/22/23 1407  TSH 3.395   Anemia work up No results for input(s): "VITAMINB12", "FOLATE", "FERRITIN", "TIBC", "IRON", "RETICCTPCT" in the last 72 hours. Urinalysis    Component Value Date/Time   COLORURINE YELLOW (A) 09/15/2023 0848   APPEARANCEUR CLEAR (A) 09/15/2023 0848   APPEARANCEUR Clear 02/19/2013 0254   LABSPEC 1.011 09/15/2023 0848   LABSPEC 1.006 02/19/2013 0254   PHURINE 6.0 09/15/2023 0848   GLUCOSEU >=500 (A) 09/15/2023 0848   GLUCOSEU Negative 02/19/2013 0254   HGBUR NEGATIVE 09/15/2023 0848   BILIRUBINUR NEGATIVE 09/15/2023 0848   BILIRUBINUR Negative 02/19/2013 0254   KETONESUR NEGATIVE 09/15/2023 0848   PROTEINUR 100 (A) 09/15/2023 0848   NITRITE NEGATIVE 09/15/2023 0848   LEUKOCYTESUR TRACE (A) 09/15/2023 0848   LEUKOCYTESUR 1+ 02/19/2013 0254   Sepsis Labs Recent Labs  Lab 10/22/23 0857 10/23/23 0644  WBC 6.9 6.5   Microbiology Recent  Results (from the past 240 hours)  Resp panel by RT-PCR (RSV, Flu A&B, Covid) Anterior Nasal Swab     Status: None   Collection Time: 10/22/23  9:28 AM   Specimen: Anterior Nasal Swab  Result Value Ref Range Status   SARS Coronavirus 2 by RT PCR NEGATIVE NEGATIVE Final    Comment: (NOTE) SARS-CoV-2 target nucleic acids are NOT DETECTED.  The SARS-CoV-2 RNA is generally detectable in upper respiratory specimens during the acute phase of infection. The lowest concentration of SARS-CoV-2 viral copies this assay can detect is 138 copies/mL. A negative result does not preclude SARS-Cov-2 infection and should not be used as the sole basis for treatment or other patient management decisions. A negative result may occur with  improper specimen collection/handling, submission of specimen other than nasopharyngeal swab, presence of viral mutation(s) within the areas targeted by this assay, and inadequate number of viral copies(<138 copies/mL). A negative result must be combined with clinical observations, patient history, and epidemiological information. The expected result is Negative.  Fact Sheet for Patients:  BloggerCourse.com  Fact Sheet for Healthcare Providers:  SeriousBroker.it  This test is no t yet approved or cleared by the [place] FDA and  has been authorized for detection and/or diagnosis of SARS-CoV-2 by FDA under an Emergency Use Authorization (EUA). This EUA will remain  in effect (meaning this test can be used) for the duration of the COVID-19 declaration under Section 564(b)(1) of the Act, 21 U.S.C.section 360bbb-3(b)(1), unless the authorization is terminated  or revoked sooner.       Influenza A by PCR NEGATIVE NEGATIVE Final   Influenza B by PCR NEGATIVE NEGATIVE Final    Comment: (NOTE) The Xpert Xpress SARS-CoV-2/FLU/RSV plus assay is intended as an aid in the diagnosis of influenza from Nasopharyngeal swab  specimens and should not be used as a sole basis for treatment. Nasal washings and aspirates are unacceptable for Xpert Xpress SARS-CoV-2/FLU/RSV testing.  Fact Sheet for Patients: BloggerCourse.com  Fact Sheet for Healthcare Providers: SeriousBroker.it  This test is not yet approved or cleared by the [place] FDA  and has been authorized for detection and/or diagnosis of SARS-CoV-2 by FDA under an Emergency Use Authorization (EUA). This EUA will remain in effect (meaning this test can be used) for the duration of the COVID-19 declaration under Section 564(b)(1) of the Act, 21 U.S.C. section 360bbb-3(b)(1), unless the authorization is terminated or revoked.     Resp Syncytial Virus by PCR NEGATIVE NEGATIVE Final    Comment: (NOTE) Fact Sheet for Patients: BloggerCourse.com  Fact Sheet for Healthcare Providers: SeriousBroker.it  This test is not yet approved or cleared by the [place] FDA and has been authorized for detection and/or diagnosis of SARS-CoV-2 by FDA under an Emergency Use Authorization (EUA). This EUA will remain in effect (meaning this test can be used) for the duration of the COVID-19 declaration under Section 564(b)(1) of the Act, 21 U.S.C. section 360bbb-3(b)(1), unless the authorization is terminated or revoked.  Performed at [facility], [address], [place], [place] [address]      Time coordinating discharge: Over 30 minutes  SIGNED:   Tresa Moore, MD  [facility] 10/24/2023, 12:38 PM Pager   If 7PM-7AM, please contact night-coverage

## 2023-10-24 NOTE — Progress Notes (Signed)
 This RN provided discharge instructions and teaching to the patient and the patients daughter. Understanding verbalized. All belongings packed and in tow.

## 2023-10-24 NOTE — TOC Transition Note (Signed)
 Transition of Care [facility]) - Discharge Note   Patient Details  Name: Leslie Parker MRN: [id number] Date of Birth: 09-05-1932  Transition of Care [facility]) CM/SW Contact:  Leslie Hidden, RN Phone Number: 10/24/2023, 11:13 AM   Clinical Narrative:    Spoke with patient at bedside regarding therapy's recommendation for [facility]. She stating her MD from [facility] tried to arrange her with [facility] but she wasn't sure who. She was provided choices for [facility]. Patient does not wish to have [facility]. "They didn't do nothing for me." Patient was provided choices for [facility] and [facility]. Patient does not have a choice. She was advised the accepting [facility] agency will contact her within 48 hours to schedule SOC. Patient's daughter, Leslie Parker will transport her home.   Referral sent and accepted by Adelina Mings from [facility].    TOC signing off.             Patient Goals and CMS Choice            Discharge Placement                       Discharge Plan and Services Additional resources added to the After Visit Summary for                                       Social Drivers of Health (SDOH) Interventions SDOH Screenings   Food Insecurity: No Food Insecurity (10/23/2023)  Housing: Low Risk  (10/23/2023)  Transportation Needs: No Transportation Needs (10/23/2023)  Utilities: Not At Risk (10/23/2023)  Alcohol Screen: Low Risk  (08/20/2023)  Financial Resource Strain: Low Risk  (08/20/2023)  Social Connections: Moderately Integrated (10/23/2023)  Tobacco Use: Low Risk  (10/23/2023)     Readmission Risk Interventions    10/22/2023    2:28 PM 09/16/2023    2:52 PM 08/20/2023    3:45 PM  Readmission Risk Prevention Plan  Transportation Screening Complete Complete Complete  Medication Review Ocean[facility]) Complete Complete Complete  PCP or Specialist appointment within 3-5 days of discharge  Complete Complete  HRI or Home Care Consult Not Complete Patient refused Complete  HRI or Home Care  Consult Pt Refusal Comments No PT recs at this time.    SW Recovery Care/Counseling Consult Complete Complete Complete  Palliative Care Screening Not Applicable Not Applicable Not Applicable  Skilled Nursing Facility Not Applicable Not Applicable Not Applicable

## 2023-10-25 ENCOUNTER — Telehealth: Payer: Self-pay | Admitting: *Deleted

## 2023-10-25 NOTE — Patient Outreach (Signed)
 Care Coordination   10/25/2023 Name: Leslie Parker MRN: [id number] DOB: 1933-04-26   Care Coordination Outreach Attempts:  A third unsuccessful outreach was attempted today to offer the patient with information about available complex care management services.  Follow Up Plan:  No further outreach attempts will be made at this time. We have been unable to contact the patient to offer or enroll patient in complex care management services.  Encounter Outcome:  No Answer   Care Coordination Interventions:  No, not indicated    Rodney Langton, RN, MSN, CCM [place]  [place], [facility] RN Care Coordinator Direct Dial: [phone] / Main [phone] Fax [phone] Email: [email or web address]

## 2023-10-29 DIAGNOSIS — I4891 Unspecified atrial fibrillation: Secondary | ICD-10-CM | POA: Diagnosis not present

## 2023-10-29 DIAGNOSIS — I5020 Unspecified systolic (congestive) heart failure: Secondary | ICD-10-CM | POA: Diagnosis not present

## 2023-10-29 DIAGNOSIS — E1122 Type 2 diabetes mellitus with diabetic chronic kidney disease: Secondary | ICD-10-CM | POA: Diagnosis not present

## 2023-10-29 DIAGNOSIS — N183 Chronic kidney disease, stage 3 unspecified: Secondary | ICD-10-CM | POA: Diagnosis not present

## 2023-10-29 NOTE — Progress Notes (Signed)
 Established Patient Visit   Chief Complaint: No chief complaint on file.  Date of Service: 10/29/2023 Date of Birth: 05-Apr-1933 PCP: Sherial Bail, MD  History of Present Illness: Ms. Leslie Parker is a 89 y.o.female patient who history of hypertension who was recently hospitalized for diverticulitis.  She also has bradycardia with a existing permanent pacemaker.  During the hospitalization for diverticulitis she was found to have a decrease in ejection fraction with a left ventricular EF of 30% and left ventricular dimension of 5 cm.  There was moderate mitral regurgitation.  She responded well to diuresis and there was some modifications made in her medical regimen.     She reports feeling quite well without any dyspnea. Has some lower extremity edema but she has been hospitalized a number of times in the past few months with presumed volume overload although the amount of fluid removal during his hospital stays has been modest.   She tells me she is managing her own medications and as I review her prescription list I am concerned that there are a number of redundant medications.  I am not convinced that she is managing her medications well and her family member agrees.     Patient recently had pacemaker revision May 2024.  Reportedly had poor lead position original implant was April 2024  Past Medical and Surgical History  Past Medical History Past Medical History:  Diagnosis Date  . Asthma, unspecified asthma severity, unspecified whether complicated, unspecified whether persistent (HHS-HCC)   . COPD (chronic obstructive pulmonary disease) (CMS/HHS-HCC)   . Diabetes mellitus without complication (CMS/HHS-HCC)   . Hypertension   . Hypoxia   . Osteoarthritis   . Osteopenia     Past Surgical History She has a past surgical history that includes cholecystectomy; Hysterectomy Vaginal; Cholecystectomy; Hernia repair; Total shoulder replacement (Left); Replacement total knee (Left); Replacement  total knee (Right); Replacement total hip w/  resurfacing implants (Left); Insert / replace / remove pacemaker (11/15/2022); and Reduction and internal fixation of displaced intertrochanteric right hip fracture with [employer] TFN nail (Right, 02/13/2023).   Medications and Allergies  Current Medications  Current Outpatient Medications  Medication Sig Dispense Refill  . ACCU-CHEK GUIDE TEST STRIPS test strip TEST BLOOD SUGAR THREE TIMES DAILY USE AS INSTRUCTED 300 strip 10  . ACCU-CHEK SOFTCLIX LANCETS lancets TEST THREE TIMES DAILY  AS  INSTRUCTED 300 each 3  . AMIOdarone  (PACERONE ) 200 MG tablet TAKE 1 TABLET BY MOUTH EVERY DAY 90 tablet 0  . amoxicillin -clavulanate (AUGMENTIN ) 500-125 mg tablet Take 1 tablet by mouth 2 (two) times daily    . apixaban  (ELIQUIS ) 2.5 mg tablet Take 1 tablet (2.5 mg total) by mouth 2 (two) times daily 180 tablet 3  . ascorbic acid , vitamin C , (VITAMIN C ) 1000 MG tablet Take 1,000 mg by mouth once daily    . blood-glucose calibrat control Cmpk Use as directed. 1 each 5  . carvediloL  (COREG ) 6.25 MG tablet TAKE 1 TABLET BY MOUTH 2 TIMES DAILY WITH A MEAL.    . cholecalciferol  (VITAMIN D3) 1000 unit tablet Take 1,000 Units by mouth once daily    . colestipoL  (COLESTID ) 1 gram tablet take 1 tablet by mouth 2 times daily. 180 tablet 1  . cyanocobalamin  (VITAMIN B12) 1000 MCG tablet Take 1,000 mcg by mouth once daily    . fluticasone -umeclidinium-vilanterol (TRELEGY ELLIPTA) 100-62.5-25 mcg inhaler Inhale 1 Puff into the lungs once daily 60 each 5  . FUROsemide  (LASIX ) 20 MG tablet Take 1 tablet (20 mg  total) by mouth once daily as needed for Edema 90 tablet 1  . glipiZIDE  (GLUCOTROL ) 2.5 MG XL tablet TAKE 1 TABLET EVERY DAY 90 tablet 3  . hydrALAZINE  (APRESOLINE ) 25 MG tablet TAKE 1 TABLET (25 MG TOTAL) BY MOUTH 3 (THREE) TIMES DAILY AS NEEDED (FOR SBP >150 CONISTENTLY OR DBP>100 PERSISTENTLY) 270 tablet 1  . isosorbide  mononitrate (IMDUR ) 30 MG ER tablet Take 1  tablet by mouth once daily    . JARDIANCE  10 mg tablet Take 1 tablet by mouth once daily    . methocarbamoL  (ROBAXIN ) 500 MG tablet Take 500 mg by mouth every 8 (eight) hours as needed    . multivitamin tablet Take 1 tablet by mouth once daily    . omeprazole (PRILOSEC) 40 MG DR capsule Take 1 capsule (40 mg total) by mouth 2 (two) times daily before meals 180 capsule 1  . ondansetron  (ZOFRAN -ODT) 4 MG disintegrating tablet Take 4 mg by mouth every 8 (eight) hours as needed    . traMADoL  (ULTRAM ) 50 mg tablet Take 50 mg by mouth every 6 (six) hours as needed for Pain    . traZODone  (DESYREL ) 100 MG tablet TAKE 1 TABLET EVERY DAY AT NIGHT 90 tablet 3  . albuterol  90 mcg/actuation inhaler Inhale 2 inhalations into the lungs every 6 (six) hours as needed for Wheezing or Shortness of Breath 3 each 2  . blood glucose meter kit Use as directed 1 each 0  . blood sugar diagnostic, drum (BLOOD GLUCOSE DIAGNOSTIC, DRUM) test strip Use 3 (three) times daily Use as instructed. 51 each 3  . ipratropium-albuteroL  (DUO-NEB) nebulizer solution TAKE 3 MLS BY NEBULIZATION 3 (THREE) TIMES DAILY FOR 30 DAYS 270 mL 0   No current facility-administered medications for this visit.    Allergies: Baclofen and Simvastatin  Social and Family History  Social History  reports that she has never smoked. She has never used smokeless tobacco. She reports that she does not currently use alcohol. She reports that she does not use drugs.  Family History Family History  Problem Relation Name Age of Onset  . High blood pressure (Hypertension) Mother    . Heart failure Mother    . Emphysema Father    . No Known Problems Sister    . Stroke Brother    . No Known Problems Sister      Review of Systems   Review of Systems: The patient denies chest pain, shortness of breath, orthopnea, paroxysmal nocturnal dyspnea,  palpitations, heart racing, presyncope, syncope. Review of 10 Systems is negative except as described  above.  Physical Examination   Vitals:BP (!) 144/72   Pulse 64   Ht 175.3 cm (5' 9)   Wt 73.5 kg (162 lb)   SpO2 95%   BMI 23.92 kg/m  Ht:175.3 cm (5' 9) Wt:73.5 kg (162 lb) ADJ:Anib surface area is 1.89 meters squared. Body mass index is 23.92 kg/m.  HEENT: sclera anicteric Lungs: clear to auscultation bilaterally Heart: Regular rate and rhythm.  MR murmur Abdomen: soft  Extremities: edema noted to ankle        Plan  She in general looks well.  Volume status looks fine but I am concerned about here medication lists and what she is actually taking.  I have removed diltiazem  and metoprolol  and continued carvedilol . Increase lasix  to 40mg  daily  Monitor BP at home  Previously reviewed her pacemaker interrogation and she has 40% RA pacing and 70% RV pacing.  Suspect that this is  due to progressive conduction system disease.  We could make an argument for CRT upgrade but given her advanced age as well as her current quality of life which she deems appropriate I am not sure that this would be ideal from a risk-benefit standpoint.     Return in about 4 weeks (around 11/26/2023).  CHETAN B PATEL, MD

## 2023-10-30 DIAGNOSIS — K5792 Diverticulitis of intestine, part unspecified, without perforation or abscess without bleeding: Secondary | ICD-10-CM | POA: Diagnosis not present

## 2023-10-30 DIAGNOSIS — I130 Hypertensive heart and chronic kidney disease with heart failure and stage 1 through stage 4 chronic kidney disease, or unspecified chronic kidney disease: Secondary | ICD-10-CM | POA: Diagnosis not present

## 2023-10-30 DIAGNOSIS — K219 Gastro-esophageal reflux disease without esophagitis: Secondary | ICD-10-CM | POA: Diagnosis not present

## 2023-10-30 DIAGNOSIS — J4489 Other specified chronic obstructive pulmonary disease: Secondary | ICD-10-CM | POA: Diagnosis not present

## 2023-10-30 DIAGNOSIS — N184 Chronic kidney disease, stage 4 (severe): Secondary | ICD-10-CM | POA: Diagnosis not present

## 2023-10-30 DIAGNOSIS — I5023 Acute on chronic systolic (congestive) heart failure: Secondary | ICD-10-CM | POA: Diagnosis not present

## 2023-10-30 DIAGNOSIS — I495 Sick sinus syndrome: Secondary | ICD-10-CM | POA: Diagnosis not present

## 2023-10-30 DIAGNOSIS — E1122 Type 2 diabetes mellitus with diabetic chronic kidney disease: Secondary | ICD-10-CM | POA: Diagnosis not present

## 2023-10-30 DIAGNOSIS — I480 Paroxysmal atrial fibrillation: Secondary | ICD-10-CM | POA: Diagnosis not present

## 2023-11-05 ENCOUNTER — Emergency Department

## 2023-11-05 ENCOUNTER — Encounter: Payer: Self-pay | Admitting: Internal Medicine

## 2023-11-05 ENCOUNTER — Ambulatory Visit (HOSPITAL_COMMUNITY): Attending: Orthopedic Surgery

## 2023-11-05 ENCOUNTER — Encounter (HOSPITAL_COMMUNITY): Payer: Self-pay

## 2023-11-05 ENCOUNTER — Inpatient Hospital Stay
Admission: EM | Admit: 2023-11-05 | Discharge: 2023-11-07 | DRG: 291 | Disposition: A | Discharge status: Home Health | Attending: Internal Medicine | Admitting: Internal Medicine

## 2023-11-05 ENCOUNTER — Other Ambulatory Visit: Payer: Self-pay

## 2023-11-05 DIAGNOSIS — J9601 Acute respiratory failure with hypoxia: Principal | ICD-10-CM | POA: Diagnosis present

## 2023-11-05 DIAGNOSIS — I130 Hypertensive heart and chronic kidney disease with heart failure and stage 1 through stage 4 chronic kidney disease, or unspecified chronic kidney disease: Principal | ICD-10-CM | POA: Diagnosis present

## 2023-11-05 DIAGNOSIS — E1129 Type 2 diabetes mellitus with other diabetic kidney complication: Secondary | ICD-10-CM | POA: Diagnosis present

## 2023-11-05 DIAGNOSIS — Z950 Presence of cardiac pacemaker: Secondary | ICD-10-CM

## 2023-11-05 DIAGNOSIS — J4489 Other specified chronic obstructive pulmonary disease: Secondary | ICD-10-CM | POA: Diagnosis present

## 2023-11-05 DIAGNOSIS — N179 Acute kidney failure, unspecified: Secondary | ICD-10-CM | POA: Diagnosis not present

## 2023-11-05 DIAGNOSIS — R0689 Other abnormalities of breathing: Secondary | ICD-10-CM | POA: Diagnosis not present

## 2023-11-05 DIAGNOSIS — I509 Heart failure, unspecified: Secondary | ICD-10-CM

## 2023-11-05 DIAGNOSIS — I5023 Acute on chronic systolic (congestive) heart failure: Secondary | ICD-10-CM | POA: Diagnosis not present

## 2023-11-05 DIAGNOSIS — I700 Atherosclerosis of aorta: Secondary | ICD-10-CM | POA: Diagnosis not present

## 2023-11-05 DIAGNOSIS — R062 Wheezing: Secondary | ICD-10-CM | POA: Diagnosis not present

## 2023-11-05 DIAGNOSIS — R0989 Other specified symptoms and signs involving the circulatory and respiratory systems: Secondary | ICD-10-CM | POA: Diagnosis not present

## 2023-11-05 DIAGNOSIS — N184 Chronic kidney disease, stage 4 (severe): Secondary | ICD-10-CM | POA: Diagnosis not present

## 2023-11-05 DIAGNOSIS — E1122 Type 2 diabetes mellitus with diabetic chronic kidney disease: Secondary | ICD-10-CM | POA: Diagnosis present

## 2023-11-05 DIAGNOSIS — I10 Essential (primary) hypertension: Secondary | ICD-10-CM | POA: Diagnosis present

## 2023-11-05 DIAGNOSIS — Z96611 Presence of right artificial shoulder joint: Secondary | ICD-10-CM | POA: Diagnosis not present

## 2023-11-05 DIAGNOSIS — R0682 Tachypnea, not elsewhere classified: Secondary | ICD-10-CM | POA: Diagnosis not present

## 2023-11-05 DIAGNOSIS — I495 Sick sinus syndrome: Secondary | ICD-10-CM | POA: Diagnosis not present

## 2023-11-05 DIAGNOSIS — E119 Type 2 diabetes mellitus without complications: Secondary | ICD-10-CM

## 2023-11-05 DIAGNOSIS — Z7901 Long term (current) use of anticoagulants: Secondary | ICD-10-CM

## 2023-11-05 DIAGNOSIS — G4700 Insomnia, unspecified: Secondary | ICD-10-CM | POA: Insufficient documentation

## 2023-11-05 DIAGNOSIS — I5020 Unspecified systolic (congestive) heart failure: Secondary | ICD-10-CM | POA: Diagnosis present

## 2023-11-05 DIAGNOSIS — I5043 Acute on chronic combined systolic (congestive) and diastolic (congestive) heart failure: Secondary | ICD-10-CM | POA: Diagnosis not present

## 2023-11-05 DIAGNOSIS — R7989 Other specified abnormal findings of blood chemistry: Secondary | ICD-10-CM | POA: Diagnosis present

## 2023-11-05 DIAGNOSIS — I480 Paroxysmal atrial fibrillation: Secondary | ICD-10-CM | POA: Diagnosis present

## 2023-11-05 DIAGNOSIS — Z789 Other specified health status: Secondary | ICD-10-CM | POA: Insufficient documentation

## 2023-11-05 DIAGNOSIS — Z7984 Long term (current) use of oral hypoglycemic drugs: Secondary | ICD-10-CM

## 2023-11-05 DIAGNOSIS — Z79899 Other long term (current) drug therapy: Secondary | ICD-10-CM

## 2023-11-05 DIAGNOSIS — I482 Chronic atrial fibrillation, unspecified: Secondary | ICD-10-CM | POA: Diagnosis not present

## 2023-11-05 DIAGNOSIS — J90 Pleural effusion, not elsewhere classified: Secondary | ICD-10-CM | POA: Diagnosis not present

## 2023-11-05 DIAGNOSIS — N189 Chronic kidney disease, unspecified: Secondary | ICD-10-CM | POA: Diagnosis not present

## 2023-11-05 LAB — LACTIC ACID, PLASMA: Lactic Acid, Venous: 1.5 mmol/L (ref 0.5–1.9)

## 2023-11-05 LAB — URINALYSIS, W/ REFLEX TO CULTURE (INFECTION SUSPECTED)
Bilirubin Urine: NEGATIVE
Glucose, UA: NEGATIVE mg/dL
Hgb urine dipstick: NEGATIVE
Ketones, ur: NEGATIVE mg/dL
Leukocytes,Ua: NEGATIVE
Nitrite: NEGATIVE
Protein, ur: 100 mg/dL — AB
Specific Gravity, Urine: 1.012 (ref 1.005–1.030)
pH: 6 (ref 5.0–8.0)

## 2023-11-05 LAB — CBC WITH DIFFERENTIAL/PLATELET
Abs Immature Granulocytes: 0.04 10*3/uL (ref 0.00–0.07)
Basophils Absolute: 0.1 10*3/uL (ref 0.0–0.1)
Basophils Relative: 1 %
Eosinophils Absolute: 0 10*3/uL (ref 0.0–0.5)
Eosinophils Relative: 0 %
HCT: 35.4 % — ABNORMAL LOW (ref 36.0–46.0)
Hemoglobin: 11.6 g/dL — ABNORMAL LOW (ref 12.0–15.0)
Immature Granulocytes: 1 %
Lymphocytes Relative: 25 %
Lymphs Abs: 1.6 10*3/uL (ref 0.7–4.0)
MCH: 30.8 pg (ref 26.0–34.0)
MCHC: 32.8 g/dL (ref 30.0–36.0)
MCV: 93.9 fL (ref 80.0–100.0)
Monocytes Absolute: 0.4 10*3/uL (ref 0.1–1.0)
Monocytes Relative: 6 %
Neutro Abs: 4.2 10*3/uL (ref 1.7–7.7)
Neutrophils Relative %: 67 %
Platelets: 338 10*3/uL (ref 150–400)
RBC: 3.77 MIL/uL — ABNORMAL LOW (ref 3.87–5.11)
RDW: 15.4 % (ref 11.5–15.5)
WBC: 6.3 10*3/uL (ref 4.0–10.5)
nRBC: 0 % (ref 0.0–0.2)

## 2023-11-05 LAB — COMPREHENSIVE METABOLIC PANEL WITH GFR
ALT: 30 U/L (ref 0–44)
AST: 37 U/L (ref 15–41)
Albumin: 3.1 g/dL — ABNORMAL LOW (ref 3.5–5.0)
Alkaline Phosphatase: 102 U/L (ref 38–126)
Anion gap: 11 (ref 5–15)
BUN: 52 mg/dL — ABNORMAL HIGH (ref 8–23)
CO2: 24 mmol/L (ref 22–32)
Calcium: 8.5 mg/dL — ABNORMAL LOW (ref 8.9–10.3)
Chloride: 102 mmol/L (ref 98–111)
Creatinine, Ser: 2.29 mg/dL — ABNORMAL HIGH (ref 0.44–1.00)
GFR, Estimated: 20 mL/min — ABNORMAL LOW (ref >60)
Glucose, Bld: 150 mg/dL — ABNORMAL HIGH (ref 70–99)
Potassium: 3.6 mmol/L (ref 3.5–5.1)
Sodium: 137 mmol/L (ref 135–145)
Total Bilirubin: 1.5 mg/dL — ABNORMAL HIGH (ref 0.0–1.2)
Total Protein: 6.2 g/dL — ABNORMAL LOW (ref 6.5–8.1)

## 2023-11-05 LAB — BLOOD GAS, VENOUS
Acid-Base Excess: 1 mmol/L (ref 0.0–2.0)
Bicarbonate: 26.6 mmol/L (ref 20.0–28.0)
O2 Saturation: 81.8 %
Patient temperature: 37
pCO2, Ven: 45 mmHg (ref 44–60)
pH, Ven: 7.38 (ref 7.25–7.43)
pO2, Ven: 48 mmHg — ABNORMAL HIGH (ref 32–45)

## 2023-11-05 LAB — PROTIME-INR
INR: 1.3 — ABNORMAL HIGH (ref 0.8–1.2)
Prothrombin Time: 16.6 s — ABNORMAL HIGH (ref 11.4–15.2)

## 2023-11-05 LAB — PROCALCITONIN: Procalcitonin: <0.1 ng/mL

## 2023-11-05 LAB — MAGNESIUM: Magnesium: 2.3 mg/dL (ref 1.7–2.4)

## 2023-11-05 LAB — GLUCOSE, CAPILLARY: Glucose-Capillary: 138 mg/dL — ABNORMAL HIGH (ref 70–99)

## 2023-11-05 LAB — RESP PANEL BY RT-PCR (RSV, FLU A&B, COVID)  RVPGX2
Influenza A by PCR: NEGATIVE
Influenza B by PCR: NEGATIVE
Resp Syncytial Virus by PCR: NEGATIVE
SARS Coronavirus 2 by RT PCR: NEGATIVE

## 2023-11-05 LAB — CBG MONITORING, ED: Glucose-Capillary: 176 mg/dL — ABNORMAL HIGH (ref 70–99)

## 2023-11-05 LAB — TROPONIN I (HIGH SENSITIVITY)
Troponin I (High Sensitivity): 48 ng/L — ABNORMAL HIGH (ref <18)
Troponin I (High Sensitivity): 45 ng/L — ABNORMAL HIGH (ref <18)

## 2023-11-05 LAB — BRAIN NATRIURETIC PEPTIDE: B Natriuretic Peptide: >4500 pg/mL — ABNORMAL HIGH (ref 0.0–100.0)

## 2023-11-05 MED ORDER — ONDANSETRON HCL 4 MG/2ML IJ SOLN: 4.0000 mg | Freq: Four times a day (QID) | INTRAMUSCULAR | Status: DC | PRN

## 2023-11-05 MED ORDER — AMIODARONE HCL 200 MG PO TABS
200.0000 mg | ORAL_TABLET | Freq: Every day | ORAL | Status: DC
  Administered 2023-11-05 – 2023-11-07 (×3): 200 mg via ORAL
  Filled 2023-11-05 (×3): qty 1

## 2023-11-05 MED ORDER — SENNOSIDES-DOCUSATE SODIUM 8.6-50 MG PO TABS: 1.0000 | ORAL_TABLET | Freq: Every evening | ORAL | Status: DC | PRN

## 2023-11-05 MED ORDER — INSULIN ASPART 100 UNIT/ML IJ SOLN
0.0000 [IU] | Freq: Three times a day (TID) | INTRAMUSCULAR | Status: DC
  Administered 2023-11-05 – 2023-11-06 (×4): 2 [IU] via SUBCUTANEOUS
  Administered 2023-11-07 (×2): 1 [IU] via SUBCUTANEOUS
  Filled 2023-11-05 (×6): qty 1

## 2023-11-05 MED ORDER — SODIUM CHLORIDE 0.9 % IV SOLN
500.0000 mg | Freq: Once | INTRAVENOUS | Status: AC
  Administered 2023-11-05: 500 mg via INTRAVENOUS
  Filled 2023-11-05: qty 5

## 2023-11-05 MED ORDER — CARVEDILOL 6.25 MG PO TABS
6.2500 mg | ORAL_TABLET | Freq: Two times a day (BID) | ORAL | Status: DC
  Administered 2023-11-05 – 2023-11-07 (×5): 6.25 mg via ORAL
  Filled 2023-11-05 (×5): qty 1

## 2023-11-05 MED ORDER — INSULIN ASPART 100 UNIT/ML IJ SOLN: 0.0000 [IU] | Freq: Every day | INTRAMUSCULAR | Status: DC

## 2023-11-05 MED ORDER — TRAZODONE HCL 100 MG PO TABS
100.0000 mg | ORAL_TABLET | Freq: Every day | ORAL | Status: DC
  Administered 2023-11-05 – 2023-11-06 (×2): 100 mg via ORAL
  Filled 2023-11-05 (×2): qty 1

## 2023-11-05 MED ORDER — SODIUM CHLORIDE 0.9 % IV SOLN
2.0000 g | Freq: Once | INTRAVENOUS | Status: AC
  Administered 2023-11-05: 2 g via INTRAVENOUS
  Filled 2023-11-05: qty 20

## 2023-11-05 MED ORDER — APIXABAN 2.5 MG PO TABS
2.5000 mg | ORAL_TABLET | Freq: Two times a day (BID) | ORAL | Status: DC
  Administered 2023-11-05 – 2023-11-07 (×4): 2.5 mg via ORAL
  Filled 2023-11-05 (×5): qty 1

## 2023-11-05 MED ORDER — HYDRALAZINE HCL 20 MG/ML IJ SOLN
5.0000 mg | Freq: Four times a day (QID) | INTRAMUSCULAR | Status: DC | PRN
  Administered 2023-11-07: 5 mg via INTRAVENOUS
  Filled 2023-11-05: qty 1

## 2023-11-05 MED ORDER — FUROSEMIDE 10 MG/ML IJ SOLN
40.0000 mg | Freq: Once | INTRAMUSCULAR | Status: AC
  Administered 2023-11-05: 40 mg via INTRAVENOUS
  Filled 2023-11-05: qty 4

## 2023-11-05 MED ORDER — VITAMIN C 500 MG PO TABS
250.0000 mg | ORAL_TABLET | Freq: Every day | ORAL | Status: DC
  Administered 2023-11-06 – 2023-11-07 (×2): 250 mg via ORAL
  Filled 2023-11-05 (×2): qty 1

## 2023-11-05 MED ORDER — ACETAMINOPHEN 650 MG RE SUPP: 650.0000 mg | Freq: Four times a day (QID) | RECTAL | Status: DC | PRN

## 2023-11-05 MED ORDER — ACETAMINOPHEN 325 MG PO TABS: 650.0000 mg | ORAL_TABLET | Freq: Four times a day (QID) | ORAL | Status: DC | PRN

## 2023-11-05 MED ORDER — ISOSORBIDE MONONITRATE ER 30 MG PO TB24
30.0000 mg | ORAL_TABLET | Freq: Every day | ORAL | Status: DC
  Administered 2023-11-05 – 2023-11-06 (×2): 30 mg via ORAL
  Filled 2023-11-05 (×2): qty 1

## 2023-11-05 MED ORDER — ONDANSETRON HCL 4 MG PO TABS: 4.0000 mg | ORAL_TABLET | Freq: Four times a day (QID) | ORAL | Status: DC | PRN

## 2023-11-05 MED ORDER — HEPARIN SODIUM (PORCINE) 5000 UNIT/ML IJ SOLN: 5000.0000 [IU] | Freq: Three times a day (TID) | INTRAMUSCULAR | Status: DC

## 2023-11-05 MED ORDER — FUROSEMIDE 10 MG/ML IJ SOLN
40.0000 mg | Freq: Two times a day (BID) | INTRAMUSCULAR | Status: AC
  Administered 2023-11-05 – 2023-11-06 (×2): 40 mg via INTRAVENOUS
  Filled 2023-11-05 (×2): qty 4

## 2023-11-05 NOTE — Assessment & Plan Note (Signed)
 Trazodone 100 mg at bedtime resumed

## 2023-11-05 NOTE — ED Notes (Signed)
 Called CCMD and had pt added to monitoring.

## 2023-11-05 NOTE — Assessment & Plan Note (Signed)
 Patient struggles with taking care of herself Patient states that she has home health that comes to her home however she has been admitted to the hospital 6 times since January 2025 I discussed about going to a nursing home or assisted living facility Patient states she will think about it however she would like to go home if possible Daughter would like to speak with a case manager for options for her mother PT, OT, TOC consulted

## 2023-11-05 NOTE — Hospital Course (Signed)
 Ms. Leslie Parker is an 89 year old female with history of COPD, heart failure reduced ejection fraction, CKD stage IV, non-insulin-dependent diabetes mellitus, hypertension, sick sinus syndrome, status post pacemaker placement in April 2024, paroxysmal atrial fibrillation on Eliquis, who presents ED for chief concerns of difficulty breathing.  Per ED documentation: Patient was found to be hypoxic with SpO2 of 77% on room air via EMS and patient was placed on 4 L nasal cannula with improvement to 97%, DuoNebs one-time treatment, Solu-Medrol 125 mg IV one-time dose, magnesium 2 g IV one-time dose were given. Vitals in the ED showed temperature of 97.3, respiration rate 20, heart rate 76, blood pressure 178/84, SpO2 96% on 4 L nasal cannula.  Serum sodium is 137, potassium 3.6, chloride 102, bicarb 24, BUN of 52, serum creatinine of 2.29, EGFR 20, nonfasting glucose 150, WBC 6.3, hemoglobin 11.6, platelets of 338.  Lactic acid 1.5.  COVID/influenza A/influenza B/RSV PCR were negative.  BNP was elevated 4500.  High sensitive troponin was 48.  ED treatment: Furosemide 40 mg IV one-time dose, azithromycin 500 mg IV one-time dose, ceftriaxone 2 g IV one-time dose.

## 2023-11-05 NOTE — H&P (Signed)
 History and Physical   Leslie Parker VWU:[id number] DOB: 1933-06-14 DOA: 11/05/2023  PCP: [name], MD  Outpatient Specialists: Dr. Florestine Avers, [facility] cardiology Patient coming from: Home via EMS  I have personally briefly reviewed patient's old medical records in [facility] EMR.  Chief Concern: Shortness of breath  HPI: Leslie Parker is an 89 year old female with history of COPD, heart failure reduced ejection fraction, CKD stage IV, non-insulin-dependent diabetes mellitus, hypertension, sick sinus syndrome, status post pacemaker placement in April 2024, paroxysmal atrial fibrillation on Eliquis, who presents ED for chief concerns of difficulty breathing.  Per ED documentation: Patient was found to be hypoxic with SpO2 of 77% on room air via EMS and patient was placed on 4 L nasal cannula with improvement to 97%, DuoNebs one-time treatment, Solu-Medrol 125 mg IV one-time dose, magnesium 2 g IV one-time dose were given. Vitals in the ED showed temperature of 97.3, respiration rate 20, heart rate 76, blood pressure 178/84, SpO2 96% on 4 L nasal cannula.  Serum sodium is 137, potassium 3.6, chloride 102, bicarb 24, BUN of 52, serum creatinine of 2.29, EGFR 20, nonfasting glucose 150, WBC 6.3, hemoglobin 11.6, platelets of 338.  Lactic acid 1.5.  COVID/influenza A/influenza B/RSV PCR were negative.  BNP was elevated 4500.  High sensitive troponin was 48.  ED treatment: Furosemide 40 mg IV one-time dose, azithromycin 500 mg IV one-time dose, ceftriaxone 2 g IV one-time dose. ------------------------------------- At bedside, patient is able to tell me her first and last name, age, location, current calendar year.  She reports the shortness of breath started this morning.  She reports over the last 5 days, she has noted that her weight keeps going up and up.  Daughter at bedside and patient states that she likely has gained about 5 pounds over the last 5 days.  She  reports that she has been drinking less fluid than she used to however she finds it very difficult to stay away from fluids.  She still drinks a bottle of Gatorade per day and she does drink water however she does not drink 1 L of water.  Today it was difficult to gather exactly how much fluid she is taking in.  She endorses lower extremity swelling as before.  She reports she still makes urine.  She denies dysuria, hematuria, diarrhea, chest pain, syncope.  She denies cough, fever, chills.  Social history: She currently lives at home on her own.  Her daughter from [place] is visiting her until Saturday.  Daughter came to help with weighing patient every day.  Patient gets home health visits.  She denies tobacco, EtOH, recreational drug use.  ROS: Constitutional: no weight change, no fever ENT/Mouth: no sore throat, no rhinorrhea Eyes: no eye pain, no vision changes Cardiovascular: no chest pain, + dyspnea,  + edema, no palpitations Respiratory: no cough, no sputum, no wheezing Gastrointestinal: no nausea, no vomiting, no diarrhea, no constipation Genitourinary: no urinary incontinence, no dysuria, no hematuria Musculoskeletal: no arthralgias, no myalgias Skin: no skin lesions, no pruritus, Neuro: + weakness, no loss of consciousness, no syncope Psych: no anxiety, no depression, no decrease appetite Heme/Lymph: no bruising, no bleeding  ED Course: Discussed with EDP, patient requiring hospitalization for chief concerns of heart failure, COPD exacerbation.  Assessment/Plan  Principal Problem:   Acute exacerbation of CHF (congestive heart failure) (HCC) Active Problems:   Sick sinus syndrome (HCC)   AKI (acute kidney injury) (HCC)   Paroxysmal atrial fibrillation (HCC)  Hypomagnesemia   Type II diabetes mellitus with renal manifestations (HCC)   CKD (chronic kidney disease) stage 4, GFR 15-29 ml/min (HCC)   HTN (hypertension), malignant   HFrEF (heart failure with reduced  ejection fraction) (HCC)   Chronic a-fib (HCC)   Diabetes mellitus without complication (HCC)   Elevated troponin   Insomnia   Frequent hospital admissions   Assessment and Plan:  * Acute exacerbation of CHF (congestive heart failure) (HCC) Strict I's and O's Status post furosemide 40 mg IV one-time dose per EP Continue with 40 mg furosemide twice daily, 2 doses ordered on admission Patient had complete echo on 08/09/2023 which showed estimated ejection fraction of 25 to 30% Prior hospitalization was reviewed  AKI (acute kidney injury) (HCC) Suspect secondary to cardiorenal Strict I's and O's Status post furosemide 40 mg IV one-time dose.  EP Continue with furosemide 40 mg IV twice daily, 2 additional doses ordered on admission Recheck BMP in the a.m.  Sick sinus syndrome [facility]) Status post pacemaker placement in April 2024  Type II diabetes mellitus with renal manifestations (HCC) Home glipizide will not be resumed on admission Insulin SSI with at bedtime coverage ordered on admission  HTN (hypertension), malignant Hydralazine 5 mg IV every 6 hours as needed for SBP greater than 165, 5 days ordered  Chronic a-fib (HCC) Home amiodarone 200 mg daily, Eliquis 2.5 mg BID were resumed on admission  Frequent hospital admissions Patient struggles with taking care of herself Patient states that she has home health that comes to her home however she has been admitted to the hospital 6 times since January 2025 I discussed about going to a nursing home or assisted living facility Patient states she will think about it however she would like to go home if possible Daughter would like to speak with a case manager for options for her mother PT, OT, TOC consulted  Insomnia Trazodone 100 mg at bedtime resumed  Elevated troponin Suspect secondary to demand ischemia in setting of fluid overload Patient denies chest pain and EKG was negative for ischemic changes  Chart reviewed.    DVT prophylaxis: Eliquis 2.5 mg p.o. twice daily Code Status: Full code Diet: Heart healthy Family Communication: Updated and discussed with daughter, [name] at bedside with patient's permission Disposition Plan: Pending clinical course Consults called: PT, OT, TOC Admission status: Telemetry cardiac, inpatient  Past Medical History:  Diagnosis Date   Acute kidney injury superimposed on CKD (HCC) 11/24/2022   Asthma    Atrial fibrillation with RVR (HCC)    Cancer (HCC)    Basal Cell   CHF (congestive heart failure) (HCC)    Closed left hip fracture (HCC) 01/25/2017   Closed right hip fracture (HCC) 02/13/2023   Diabetes mellitus without complication (HCC)    Femur fracture, left (HCC) 02/03/2015   Heart murmur    Hypertension    Impetigo    Osteoarthritis    Osteopenia    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   Rapid atrial fibrillation, new onset(HCC) 06/05/2022   Vomiting    can not due to surgery   Wears dentures    full upper and lower   Past Surgical History:  Procedure Laterality Date   ABDOMINAL HYSTERECTOMY     BACK SURGERY     BLADDER SURGERY     mesh   CARPAL TUNNEL RELEASE Bilateral    CATARACT EXTRACTION Right 2017   CATARACT EXTRACTION W/PHACO Left 02/06/2016   Procedure: CATARACT EXTRACTION PHACO AND INTRAOCULAR LENS PLACEMENT (IOC)  left eye;  Surgeon: Sherald Hess, MD;  Location: [facility];  Service: Ophthalmology;  Laterality: Left;  DIABETIC LEFT Cannot arrive before 9:30   CERVICAL DISC SURGERY     CHOLECYSTECTOMY     EYE SURGERY Bilateral    Cataract Extraction with IOL   FEMUR IM NAIL Left 02/04/2015   Procedure: INTRAMEDULLARY (IM) NAIL FEMORAL;  Surgeon: Juanell Fairly, MD;  Location: [facility];  Service: Orthopedics;  Laterality: Left;   HARDWARE REMOVAL Left 01/17/2017   Procedure: HARDWARE REMOVAL;  Surgeon: Juanell Fairly, MD;  Location: [facility];  Service: Orthopedics;  Laterality: Left;   HERNIA REPAIR  2014    esophageal and gastric mesh. patient unable to throw up d/t mesh   HIP ARTHROPLASTY Left 01/26/2017   Procedure: ARTHROPLASTY BIPOLAR HIP (HEMIARTHROPLASTY) removal hardware left hip;  Surgeon: Deeann Saint, MD;  Location: [facility];  Service: Orthopedics;  Laterality: Left;   INTRAMEDULLARY (IM) NAIL INTERTROCHANTERIC Right 02/13/2023   Procedure: INTRAMEDULLARY (IM) NAIL INTERTROCHANTERIC;  Surgeon: Christena Flake, MD;  Location: [facility];  Service: Orthopedics;  Laterality: Right;   JOINT REPLACEMENT Bilateral    knees   PACEMAKER IMPLANT N/A 11/15/2022   Procedure: PACEMAKER IMPLANT;  Surgeon: Marcina Millard, MD;  Location: [facility];  Service: Cardiovascular;  Laterality: N/A;   PACEMAKER IMPLANT N/A 11/28/2022   Procedure: PACEMAKER IMPLANT;  Surgeon: Marcina Millard, MD;  Location: [facility];  Service: Cardiovascular;  Laterality: N/A;  Lead reposition   REPLACEMENT TOTAL KNEE BILATERAL Bilateral [id number]   SHOULDER ARTHROSCOPY WITH OPEN ROTATOR CUFF REPAIR AND DISTAL CLAVICLE ACROMINECTOMY Left 10/25/2016   Procedure: SHOULDER ARTHROSCOPY WITH OPEN ROTATOR CUFF REPAIR AND DISTAL CLAVICLE ACROMINECTOMY;  Surgeon: Juanell Fairly, MD;  Location: [facility];  Service: Orthopedics;  Laterality: Left;   THYROID SURGERY     goiter removed   TOTAL HIP REVISION Left 12/02/2017   Procedure: TOTAL HIP REVISION;  Surgeon: Lyndle Herrlich, MD;  Location: [facility];  Service: Orthopedics;  Laterality: Left;   TOTAL SHOULDER REPLACEMENT Right 2012   Social History:  reports that she has never smoked. She has never used smokeless tobacco. She reports that she does not drink alcohol and does not use drugs.  Allergies  Allergen Reactions   Baclofen Other (See Comments)    Elevated Cr   Simvastatin Other (See Comments)    Pt denies  Elevated LFTs with simva 40mg , stopped and resolved (see MD note 11/10/13)   Family History  Problem Relation Age of Onset   Heart failure  Mother    Hypertension Mother    Emphysema Father    Family history: Family history reviewed and not pertinent.  Prior to Admission medications   Medication Sig Start Date End Date Taking? Authorizing Provider  acetaminophen (TYLENOL) 325 MG tablet Take 2 tablets (650 mg total) by mouth every 4 (four) hours as needed for headache or mild pain (pain score 1-3). 09/19/23   Lurene Shadow, MD  albuterol (VENTOLIN HFA) 108 (90 Base) MCG/ACT inhaler Inhale 2 puffs into the lungs every 6 (six) hours as needed. 08/04/19   [provider]  amiodarone (PACERONE) 200 MG tablet Take 1 tablet by mouth daily. 08/26/23   [provider]  apixaban (ELIQUIS) 2.5 MG TABS tablet Take 1 tablet (2.5 mg total) by mouth 2 (two) times daily. 06/07/22   Tresa Moore, MD  ascorbic acid (VITAMIN C) 250 MG CHEW Chew 250 mg by mouth daily.    [provider]  carvedilol (COREG) 6.25 MG tablet Take 1 tablet (6.25 mg total) by mouth 2 (two) times daily with a meal. 08/11/23 11/09/23  Unk Garb, DO  Cholecalciferol (VITAMIN D-1000 MAX ST) 25 MCG (1000 UT) tablet Take 1,000 Units by mouth daily.    [provider]  Cyanocobalamin (B-12) 2500 MCG TABS Take 2,500 mcg by mouth daily.    [provider]  glipiZIDE (GLUCOTROL XL) 2.5 MG 24 hr tablet Take 2.5 mg by mouth daily.    [provider]  hydrALAZINE (APRESOLINE) 50 MG tablet Take 1 tablet (50 mg total) by mouth 3 (three) times daily. Patient taking differently: Take 50 mg by mouth 3 (three) times daily as needed. 08/11/23 11/09/23  Unk Garb, DO  ipratropium-albuterol (DUONEB) 0.5-2.5 (3) MG/3ML SOLN Inhale 3 mLs into the lungs every 6 (six) hours as needed. 06/01/22   [provider]  isosorbide mononitrate (IMDUR) 30 MG 24 hr tablet Take 1 tablet (30 mg total) by mouth at bedtime. 08/11/23 11/09/23  Unk Garb, DO  Magnesium Oxide 500 MG CAPS Take 500 mg by mouth daily.    [provider]  metoprolol  tartrate (LOPRESSOR) 25 MG tablet Take 25 mg by mouth 2 (two) times daily. 10/06/23   [provider]  Multiple Vitamin (MULTIVITAMIN WITH MINERALS) TABS tablet Take 1 tablet by mouth daily. One-A-Day Women's Vitamin    [provider]  torsemide (DEMADEX) 20 MG tablet Take 1 tablet (20 mg total) by mouth daily. 10/24/23 11/23/23  Tiajuana Fluke, MD  traZODone (DESYREL) 100 MG tablet Take 100 mg by mouth at bedtime.    [provider]  TRELEGY ELLIPTA 100-62.5-25 MCG/ACT AEPB Inhale 1 puff into the lungs daily. 08/03/22   [provider]   Physical Exam: Vitals:   11/05/23 0930 11/05/23 1000 11/05/23 1030 11/05/23 1505  BP: (!) 154/77 (!) 178/84 (!) 176/88   Pulse: 80 76 74   Resp: (!) 22 20 (!) 24   Temp:    (!) 97.4 F (36.3 C)  TempSrc:    Oral  SpO2: 97% 96% 95%   Weight:      Height:       Constitutional: appears frail chronically ill Eyes: PERRL, lids and conjunctivae normal ENMT: Mucous membranes are moist. Posterior pharynx clear of any exudate or lesions. Age-appropriate dentition. Hearing appropriate Neck: normal, supple, no masses, no thyromegaly Respiratory: Generalized decreased lung sounds on auscultation bilaterally, no wheezing, no crackles. Increased respiratory effort. No accessory muscle use. 4 L [place] in place Cardiovascular: Regular rate and rhythm, no murmurs / rubs / gallops. No extremity edema. 2+ pedal pulses. No carotid bruits.  Abdomen: no tenderness, no masses palpated, no hepatosplenomegaly. Bowel sounds positive.  Musculoskeletal: no clubbing / cyanosis. No joint deformity upper and lower extremities. Good ROM, no contractures, no atrophy. Normal muscle tone.  Skin: no rashes, lesions, ulcers. No induration Neurologic: Sensation intact. Strength 5/5 in all 4.  Psychiatric: Normal judgment and insight. Alert and oriented x 3. Depressed mood.   EKG: independently reviewed, showing sinus rhythm with rate of 83, QTc 520  Chest  x-ray on Admission: I personally reviewed and I agree with radiologist reading as below.  DG Chest Port 1 View Result Date: 11/05/2023 CLINICAL DATA:  Questionable sepsis. EXAM: PORTABLE CHEST 1 VIEW COMPARISON:  X-ray 10/22/2023 and older FINDINGS: Enlarged cardiopericardial silhouette with calcified aorta. Left upper chest pacemaker. Moderate left effusion. Tiny right. Vascular calcifications. No pneumothorax. Chronic lung changes. Interval changes  with some vascular congestion. Right shoulder reverse arthroplasty seen. Films are under penetrated. Surgical Janae Mclean is along the left humeral head. IMPRESSION: Enlarged cardiopericardial silhouette calcified aorta and vascular shin. Chronic lung changes. Bilateral pleural effusions are seen, moderate left and small right. Adjacent opacity. Recommend follow-up. Electronically Signed   By: Adrianna Horde M.D.   On: 11/05/2023 12:45    Labs on Admission: I have personally reviewed following labs CBC: Recent Labs  Lab 11/05/23 0927  WBC 6.3  NEUTROABS 4.2  HGB 11.6*  HCT 35.4*  MCV 93.9  PLT 338   Basic Metabolic Panel: Recent Labs  Lab 11/05/23 0927  NA 137  K 3.6  CL 102  CO2 24  GLUCOSE 150*  BUN 52*  CREATININE 2.29*  CALCIUM 8.5*  MG 2.3   GFR: Estimated Creatinine Clearance: 17.7 mL/min (A) (by C-G formula based on SCr of 2.29 mg/dL (H)).  Liver Function Tests: Recent Labs  Lab 11/05/23 0927  AST 37  ALT 30  ALKPHOS 102  BILITOT 1.5*  PROT 6.2*  ALBUMIN 3.1*   Coagulation Profile: Recent Labs  Lab 11/05/23 0927  INR 1.3*   Urine analysis:    Component Value Date/Time   COLORURINE YELLOW (A) 11/05/2023 1505   APPEARANCEUR CLEAR (A) 11/05/2023 1505   APPEARANCEUR Clear 02/19/2013 0254   LABSPEC 1.012 11/05/2023 1505   LABSPEC 1.006 02/19/2013 0254   PHURINE 6.0 11/05/2023 1505   GLUCOSEU NEGATIVE 11/05/2023 1505   GLUCOSEU Negative 02/19/2013 0254   HGBUR NEGATIVE 11/05/2023 1505   BILIRUBINUR NEGATIVE  11/05/2023 1505   BILIRUBINUR Negative 02/19/2013 0254   KETONESUR NEGATIVE 11/05/2023 1505   PROTEINUR 100 (A) 11/05/2023 1505   NITRITE NEGATIVE 11/05/2023 1505   LEUKOCYTESUR NEGATIVE 11/05/2023 1505   LEUKOCYTESUR 1+ 02/19/2013 0254   This document was prepared using Dragon Voice Recognition software and may include unintentional dictation errors.  Dr. Reinhold Carbine [facility]  If 7PM-7AM, please contact overnight-coverage provider If 7AM-7PM, please contact day attending provider [email or web address]  11/05/2023, 3:38 PM

## 2023-11-05 NOTE — Assessment & Plan Note (Signed)
 Suspect secondary to cardiorenal Strict I's and O's Status post furosemide 40 mg IV one-time dose.  EP Continue with furosemide 40 mg IV twice daily, 2 additional doses ordered on admission Recheck BMP in the a.m.

## 2023-11-05 NOTE — Assessment & Plan Note (Signed)
 Suspect secondary to demand ischemia in setting of fluid overload Patient denies chest pain and EKG was negative for ischemic changes

## 2023-11-05 NOTE — Assessment & Plan Note (Signed)
 Home amiodarone 200 mg daily, Eliquis 2.5 mg BID were resumed on admission

## 2023-11-05 NOTE — ED Notes (Signed)
 Pt ate only a few bites of dinner. Reports she has no appetite. Left at bedside, encouraged to eat a little more as she did receive insulin as ordered. States she will try to eat a little more.

## 2023-11-05 NOTE — Assessment & Plan Note (Signed)
 Status post pacemaker placement in April 2024

## 2023-11-05 NOTE — Assessment & Plan Note (Signed)
 Strict I's and O's Status post furosemide 40 mg IV one-time dose per EP Continue with 40 mg furosemide twice daily, 2 doses ordered on admission Patient had complete echo on 08/09/2023 which showed estimated ejection fraction of 25 to 30% Prior hospitalization was reviewed

## 2023-11-05 NOTE — Assessment & Plan Note (Signed)
 Hydralazine 5 mg IV every 6 hours as needed for SBP greater than 165, 5 days ordered

## 2023-11-05 NOTE — Assessment & Plan Note (Signed)
 Home glipizide will not be resumed on admission Insulin SSI with at bedtime coverage ordered on admission

## 2023-11-05 NOTE — ED Triage Notes (Addendum)
 Pt arrives from [facility] from home with c/o difficulty breathing that started this morning but seems to be a chronic issue. EMS found pt to be 77% on RA. Pt was given a duoneb, 125mg  of solumedrol and 2g of mag and placed on 4L [place], after pt was found to be 97%. Pt was also given 100mL of D5. Per EMS pt only able to speak 2-3 words at a time. Pt is using accessory muscles and WOB is increased at this time. Pt denies CP, N/V/D.

## 2023-11-05 NOTE — ED Provider Notes (Signed)
 [facility] Provider Note    Event Date/Time   First MD Initiated Contact with Patient 11/05/23 [phone]     (approximate)   History   Chief Complaint: Respiratory Distress   HPI  Leslie Parker is a 89 y.o. female With a history of atrial fibrillation, CHF, diabetes, COPD who was brought to the ED due to shortness of breath starting this morning.  EMS report room air oxygen saturation of 77%.  Patient is normally not on oxygen at home.  EMS also noted initial wheezing, they gave 125 mg of Solu-Medrol, 2 g of mag, DuoNeb with improvement.  No fever or productive cough.  Denies chest pain.        Past Medical History:  Diagnosis Date   Acute kidney injury superimposed on CKD (HCC) 11/24/2022   Asthma    Atrial fibrillation with RVR (HCC)    Cancer (HCC)    Basal Cell   CHF (congestive heart failure) (HCC)    Closed left hip fracture (HCC) 01/25/2017   Closed right hip fracture (HCC) 02/13/2023   Diabetes mellitus without complication (HCC)    Femur fracture, left (HCC) 02/03/2015   Heart murmur    Hypertension    Impetigo    Osteoarthritis    Osteopenia    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   Rapid atrial fibrillation, new onset(HCC) 06/05/2022   Vomiting    can not due to surgery   Wears dentures    full upper and lower    Current Outpatient Rx   Order #: [id number] Class: OTC   Order #: [id number] Class: Historical Med   Order #: [id number] Class: Historical Med   Order #: [id number] Class: Normal   Order #: [id number] Class: Historical Med   Order #: [id number] Class: Normal   Order #: [id number] Class: Historical Med   Order #: [id number] Class: Historical Med   Order #: [id number] Class: Historical Med   Order #: [id number] Class: Normal   Order #: [id number] Class: Historical Med   Order #: [id number] Class: Normal   Order #: [id number] Class: Historical Med   Order #: [id number] Class: Historical Med   Order #: [id number] Class: Historical Med   Order  #: [id number] Class: Normal   Order #: [id number] Class: Historical Med   Order #: [id number] Class: Historical Med    Past Surgical History:  Procedure Laterality Date   ABDOMINAL HYSTERECTOMY     BACK SURGERY     BLADDER SURGERY     mesh   CARPAL TUNNEL RELEASE Bilateral    CATARACT EXTRACTION Right 2017   CATARACT EXTRACTION W/PHACO Left 02/06/2016   Procedure: CATARACT EXTRACTION PHACO AND INTRAOCULAR LENS PLACEMENT (IOC) left eye;  Surgeon: Sherald Hess, MD;  Location: [facility];  Service: Ophthalmology;  Laterality: Left;  DIABETIC LEFT Cannot arrive before 9:30   CERVICAL DISC SURGERY     CHOLECYSTECTOMY     EYE SURGERY Bilateral    Cataract Extraction with IOL   FEMUR IM NAIL Left 02/04/2015   Procedure: INTRAMEDULLARY (IM) NAIL FEMORAL;  Surgeon: Juanell Fairly, MD;  Location: [facility];  Service: Orthopedics;  Laterality: Left;   HARDWARE REMOVAL Left 01/17/2017   Procedure: HARDWARE REMOVAL;  Surgeon: Juanell Fairly, MD;  Location: [facility];  Service: Orthopedics;  Laterality: Left;   HERNIA REPAIR  2014   esophageal and gastric mesh. patient unable to throw up d/t mesh   HIP ARTHROPLASTY Left 01/26/2017   Procedure: ARTHROPLASTY BIPOLAR HIP (HEMIARTHROPLASTY) removal hardware left hip;  Surgeon: Deeann Saint, MD;  Location: [facility];  Service: Orthopedics;  Laterality: Left;   INTRAMEDULLARY (IM) NAIL INTERTROCHANTERIC Right 02/13/2023   Procedure: INTRAMEDULLARY (IM) NAIL INTERTROCHANTERIC;  Surgeon: Christena Flake, MD;  Location: [facility];  Service: Orthopedics;  Laterality: Right;   JOINT REPLACEMENT Bilateral    knees   PACEMAKER IMPLANT N/A 11/15/2022   Procedure: PACEMAKER IMPLANT;  Surgeon: Marcina Millard, MD;  Location: [facility];  Service: Cardiovascular;  Laterality: N/A;   PACEMAKER IMPLANT N/A 11/28/2022   Procedure: PACEMAKER IMPLANT;  Surgeon: Marcina Millard, MD;  Location: [facility];  Service: Cardiovascular;   Laterality: N/A;  Lead reposition   REPLACEMENT TOTAL KNEE BILATERAL Bilateral 2956,2130   SHOULDER ARTHROSCOPY WITH OPEN ROTATOR CUFF REPAIR AND DISTAL CLAVICLE ACROMINECTOMY Left 10/25/2016   Procedure: SHOULDER ARTHROSCOPY WITH OPEN ROTATOR CUFF REPAIR AND DISTAL CLAVICLE ACROMINECTOMY;  Surgeon: Juanell Fairly, MD;  Location: [facility];  Service: Orthopedics;  Laterality: Left;   THYROID SURGERY     goiter removed   TOTAL HIP REVISION Left 12/02/2017   Procedure: TOTAL HIP REVISION;  Surgeon: Lyndle Herrlich, MD;  Location: [facility];  Service: Orthopedics;  Laterality: Left;   TOTAL SHOULDER REPLACEMENT Right 2012    Physical Exam   Triage Vital Signs: ED Triage Vitals  Encounter Vitals Group     BP      Systolic BP Percentile      Diastolic BP Percentile      Pulse      Resp      Temp      Temp src      SpO2      Weight      Height      Head Circumference      Peak Flow      Pain Score      Pain Loc      Pain Education      Exclude from Growth Chart     Most recent vital signs: Vitals:   11/05/23 1000 11/05/23 1030  BP: (!) 178/84 (!) 176/88  Pulse: 76 74  Resp: 20 (!) 24  Temp:    SpO2: 96% 95%    General: Awake, mild respiratory distress. CV:  Good peripheral perfusion.  Regular rate Resp:  Tachypnea, accessory muscle use.  There is diminished air movement at bilateral bases with crackles Abd:  No distention.  Soft nontender Other:  No significant lower extremity edema.  Dry oral mucosa   ED Results / Procedures / Treatments   Labs (all labs ordered are listed, but only abnormal results are displayed) Labs Reviewed  COMPREHENSIVE METABOLIC PANEL WITH GFR - Abnormal; Notable for the following components:      Result Value   Glucose, Bld 150 (*)    BUN 52 (*)    Creatinine, Ser 2.29 (*)    Calcium 8.5 (*)    Total Protein 6.2 (*)    Albumin 3.1 (*)    Total Bilirubin 1.5 (*)    GFR, Estimated 20 (*)    All other components within normal limits  CBC  WITH DIFFERENTIAL/PLATELET - Abnormal; Notable for the following components:   RBC 3.77 (*)    Hemoglobin 11.6 (*)    HCT 35.4 (*)    All other components within normal limits  PROTIME-INR - Abnormal; Notable for the following components:   Prothrombin Time 16.6 (*)    INR 1.3 (*)    All other components within normal limits  BLOOD GAS, VENOUS - Abnormal; Notable for the following components:  pO2, Ven 48 (*)    All other components within normal limits  BRAIN NATRIURETIC PEPTIDE - Abnormal; Notable for the following components:   B Natriuretic Peptide >4,500.0 (*)    All other components within normal limits  TROPONIN I (HIGH SENSITIVITY) - Abnormal; Notable for the following components:   Troponin I (High Sensitivity) 48 (*)    All other components within normal limits  RESP PANEL BY RT-PCR (RSV, FLU A&B, COVID)  [id number]  CULTURE, BLOOD (ROUTINE X 2)  CULTURE, BLOOD (ROUTINE X 2)  LACTIC ACID, PLASMA  MAGNESIUM  LACTIC ACID, PLASMA  URINALYSIS, W/ REFLEX TO CULTURE (INFECTION SUSPECTED)  PROCALCITONIN  TROPONIN I (HIGH SENSITIVITY)     EKG Interpreted by me Sinus rhythm rate of 83, normal axis, left bundle branch block, prolonged QTc of 520 ms, poor R wave progression.  No acute ischemic changes.   RADIOLOGY Chest x-ray interpreted by me, shows decreased aeration of the left lower lung, concerning for pneumonia versus effusion   PROCEDURES:  .Critical Care  Performed by: Jacquie Maudlin, MD Authorized by: Jacquie Maudlin, MD   Critical care provider statement:    Critical care time (minutes):  35   Critical care time was exclusive of:  Separately billable procedures and treating other patients   Critical care was necessary to treat or prevent imminent or life-threatening deterioration of the following conditions:  Respiratory failure   Critical care was time spent personally by me on the following activities:  Development of treatment plan with patient or  surrogate, discussions with consultants, evaluation of patient's response to treatment, examination of patient, obtaining history from patient or surrogate, ordering and performing treatments and interventions, ordering and review of laboratory studies, ordering and review of radiographic studies, pulse oximetry, re-evaluation of patient's condition and review of old charts   Care discussed with: admitting provider      MEDICATIONS ORDERED IN ED: Medications  furosemide (LASIX) injection 40 mg (has no administration in time range)  cefTRIAXone (ROCEPHIN) 2 g in sodium chloride 0.9 % 100 mL IVPB (0 g Intravenous Stopped 11/05/23 1058)  azithromycin (ZITHROMAX) 500 mg in sodium chloride 0.9 % 250 mL IVPB (500 mg Intravenous New Bag/Given 11/05/23 1118)     IMPRESSION / MDM / ASSESSMENT AND PLAN / ED COURSE  I reviewed the triage vital signs and the nursing notes.  DDx: Pneumonia, pleural effusion, pulmonary edema, NSTEMI, electrolyte derangement, COPD exacerbation, AKI  Patient's presentation is most consistent with acute presentation with potential threat to life or bodily function.  Patient presents with respiratory distress, hypoxia, stabilized for now after bronchodilators and starting nasal cannula oxygen at 4 L.  Will start lab workup, chest x-ray.  Empiric Rocephin and azithromycin.   Clinical Course as of 11/05/23 1233  Tue Nov 05, 2023  1205 Labs c/w CHF. Baseline CKD4. Will contact hospitalist.  [PS]    Clinical Course User Index [PS] Jacquie Maudlin, MD    ----------------------------------------- 12:32 PM on 11/05/2023 ----------------------------------------- Case discussed with hospitalist   FINAL CLINICAL IMPRESSION(S) / ED DIAGNOSES   Final diagnoses:  Acute respiratory failure with hypoxia (HCC)  Acute on chronic congestive heart failure, unspecified heart failure type (HCC)  Stage 4 chronic kidney disease (HCC)     Rx / DC Orders   ED Discharge Orders      None        Note:  This document was prepared using Dragon voice recognition software and may include unintentional dictation errors.   Jacquie Maudlin,  MD 11/05/23 1233

## 2023-11-06 DIAGNOSIS — I5043 Acute on chronic combined systolic (congestive) and diastolic (congestive) heart failure: Secondary | ICD-10-CM | POA: Diagnosis not present

## 2023-11-06 LAB — BASIC METABOLIC PANEL WITH GFR
Anion gap: 9 (ref 5–15)
BUN: 57 mg/dL — ABNORMAL HIGH (ref 8–23)
CO2: 24 mmol/L (ref 22–32)
Calcium: 8.2 mg/dL — ABNORMAL LOW (ref 8.9–10.3)
Chloride: 105 mmol/L (ref 98–111)
Creatinine, Ser: 2.2 mg/dL — ABNORMAL HIGH (ref 0.44–1.00)
GFR, Estimated: 21 mL/min — ABNORMAL LOW (ref >60)
Glucose, Bld: 189 mg/dL — ABNORMAL HIGH (ref 70–99)
Potassium: 3.7 mmol/L (ref 3.5–5.1)
Sodium: 138 mmol/L (ref 135–145)

## 2023-11-06 LAB — CBC
HCT: 30.3 % — ABNORMAL LOW (ref 36.0–46.0)
Hemoglobin: 10.3 g/dL — ABNORMAL LOW (ref 12.0–15.0)
MCH: 31.4 pg (ref 26.0–34.0)
MCHC: 34 g/dL (ref 30.0–36.0)
MCV: 92.4 fL (ref 80.0–100.0)
Platelets: 268 10*3/uL (ref 150–400)
RBC: 3.28 MIL/uL — ABNORMAL LOW (ref 3.87–5.11)
RDW: 15.3 % (ref 11.5–15.5)
WBC: 5.3 10*3/uL (ref 4.0–10.5)
nRBC: 0 % (ref 0.0–0.2)

## 2023-11-06 LAB — GLUCOSE, CAPILLARY
Glucose-Capillary: 174 mg/dL — ABNORMAL HIGH (ref 70–99)
Glucose-Capillary: 165 mg/dL — ABNORMAL HIGH (ref 70–99)
Glucose-Capillary: 199 mg/dL — ABNORMAL HIGH (ref 70–99)
Glucose-Capillary: 165 mg/dL — ABNORMAL HIGH (ref 70–99)

## 2023-11-06 MED ORDER — FUROSEMIDE 10 MG/ML IJ SOLN
40.0000 mg | Freq: Two times a day (BID) | INTRAMUSCULAR | Status: DC
  Administered 2023-11-06 – 2023-11-07 (×2): 40 mg via INTRAVENOUS
  Filled 2023-11-06 (×2): qty 4

## 2023-11-06 NOTE — Progress Notes (Signed)
 PROGRESS NOTE    Leslie Parker  FAO:[id number] DOB: 11-23-1932 DOA: 11/05/2023 PCP: [name], MD    Brief Narrative:  89 year old female with history of COPD, heart failure reduced ejection fraction, CKD stage IV, non-insulin-dependent diabetes mellitus, hypertension, sick sinus syndrome, status post pacemaker placement in April 2024, paroxysmal atrial fibrillation on Eliquis, who presents ED for chief concerns of difficulty breathing.   Per ED documentation: Patient was found to be hypoxic with SpO2 of 77% on room air via EMS and patient was placed on 4 L nasal cannula with improvement to 97%, DuoNebs one-time treatment, Solu-Medrol 125 mg IV one-time dose, magnesium 2 g IV one-time dose were given. Vitals in the ED showed temperature of 97.3, respiration rate 20, heart rate 76, blood pressure 178/84, SpO2 96% on 4 L nasal cannula.  Assessment & Plan:   Principal Problem:   Acute exacerbation of CHF (congestive heart failure) (HCC) Active Problems:   Sick sinus syndrome (HCC)   AKI (acute kidney injury) (HCC)   Paroxysmal atrial fibrillation (HCC)   Hypomagnesemia   Type II diabetes mellitus with renal manifestations (HCC)   CKD (chronic kidney disease) stage 4, GFR 15-29 ml/min (HCC)   HTN (hypertension), malignant   HFrEF (heart failure with reduced ejection fraction) (HCC)   Chronic a-fib (HCC)   Diabetes mellitus without complication (HCC)   Elevated troponin   Insomnia   Frequent hospital admissions  * Acute exacerbation of CHF (congestive heart failure) (HCC) Recurrent admissions for the same.  BNP consistently elevated over 4500.  Poor ejection fraction of 25 to 30%.  Prognosis poor. Plan: Lasix 40 mg IV twice daily Strict ins and outs Daily weights Palliative care consultation for GOC discussion   AKI (acute kidney injury) (HCC) Suspect cardiorenal syndrome given improvement on IV furosemide Plan: Lasix as above Repeat electrolytes in a.m.  Sick sinus  syndrome (HCC) Status post pacemaker placement in April 2024   Type II diabetes mellitus with renal manifestations (HCC) Hold home Glucotrol Insulin SSI with at bedtime coverage ordered on admission   HTN (hypertension), malignant Hydralazine 5 mg IV every 6 hours as needed for SBP greater than 165, 5 days ordered   Chronic a-fib (HCC) Home amiodarone 200 mg daily, Eliquis 2.5 mg BID were resumed on admission   Frequent hospital admissions Patient struggles with taking care of herself Patient states that she has home health that comes to her home however she has been admitted to the hospital 6 times since January 2025 I discussed about going to a nursing home or assisted living facility Patient states she will think about it however she would like to go home if possible Daughter would like to speak with a case manager for options for her mother PT, OT, TOC consulted   Insomnia Trazodone 100 mg at bedtime resumed   Elevated troponin Suspect secondary to demand ischemia in setting of fluid overload Patient denies chest pain and EKG was negative for ischemic changes Low suspicion for ACS   DVT prophylaxis: Eliquis Code Status: Full Family Communication: None today Disposition Plan: Status is: Inpatient Remains inpatient appropriate because: Acute decompensated CHF   Level of care: Telemetry Cardiac  Consultants:  Palliative care  Procedures:  None  Antimicrobials: None   Subjective: Seen and examined.  Sitting up on edge of bed working with occupational therapy.  Feels well overall.  Objective: Vitals:   11/06/23 0339 11/06/23 0819 11/06/23 0847 11/06/23 1345  BP: (!) 149/80 138/80  138/75  Pulse: 63  65  63  Resp: 17 16  16   Temp: 97.8 F (36.6 C) 97.8 F (36.6 C)  (!) 97.4 F (36.3 C)  TempSrc: Oral Oral    SpO2: 100% 99%  100%  Weight:   75.6 kg   Height:        Intake/Output Summary (Last 24 hours) at 11/06/2023 1454 Last data filed at 11/06/2023  0900 Gross per 24 hour  Intake 240 ml  Output 200 ml  Net 40 ml   Filed Weights   11/05/23 0928 11/06/23 0847  Weight: 74.8 kg 75.6 kg    Examination:  General exam: Appears calm and comfortable  Respiratory system: Bibasilar crackles.  No work of breathing.  Room air Cardiovascular system: S1-S2, RRR, no murmurs, 2+ pedal edema Gastrointestinal system: Soft, NT/ND, normal bowel sounds Central nervous system: Alert and oriented. No focal neurological deficits. Extremities: Symmetric 5 x 5 power. Skin: No rashes, lesions or ulcers Psychiatry: Judgement and insight appear normal. Mood & affect appropriate.     Data Reviewed: I have personally reviewed following labs and imaging studies  CBC: Recent Labs  Lab 11/05/23 0927 11/06/23 0414  WBC 6.3 5.3  NEUTROABS 4.2  --   HGB 11.6* 10.3*  HCT 35.4* 30.3*  MCV 93.9 92.4  PLT 338 268   Basic Metabolic Panel: Recent Labs  Lab 11/05/23 0927 11/06/23 0414  NA 137 138  K 3.6 3.7  CL 102 105  CO2 24 24  GLUCOSE 150* 189*  BUN 52* 57*  CREATININE 2.29* 2.20*  CALCIUM 8.5* 8.2*  MG 2.3  --    GFR: Estimated Creatinine Clearance: 18.5 mL/min (A) (by C-G formula based on SCr of 2.2 mg/dL (H)). Liver Function Tests: Recent Labs  Lab 11/05/23 0927  AST 37  ALT 30  ALKPHOS 102  BILITOT 1.5*  PROT 6.2*  ALBUMIN 3.1*   No results for input(s): "LIPASE", "AMYLASE" in the last 168 hours. No results for input(s): "AMMONIA" in the last 168 hours. Coagulation Profile: Recent Labs  Lab 11/05/23 0927  INR 1.3*   Cardiac Enzymes: No results for input(s): "CKTOTAL", "CKMB", "CKMBINDEX", "TROPONINI" in the last 168 hours. BNP (last 3 results) No results for input(s): "PROBNP" in the last 8760 hours. HbA1C: No results for input(s): "HGBA1C" in the last 72 hours. CBG: Recent Labs  Lab 11/05/23 1626 11/05/23 2221 11/06/23 0820 11/06/23 1239  GLUCAP 176* 138* 174* 165*   Lipid Profile: No results for input(s):  "CHOL", "HDL", "LDLCALC", "TRIG", "CHOLHDL", "LDLDIRECT" in the last 72 hours. Thyroid Function Tests: No results for input(s): "TSH", "T4TOTAL", "FREET4", "T3FREE", "THYROIDAB" in the last 72 hours. Anemia Panel: No results for input(s): "VITAMINB12", "FOLATE", "FERRITIN", "TIBC", "IRON", "RETICCTPCT" in the last 72 hours. Sepsis Labs: Recent Labs  Lab 11/05/23 0927  PROCALCITON <0.10  LATICACIDVEN 1.5    Recent Results (from the past 240 hours)  Blood Culture (routine x 2)     Status: None (Preliminary result)   Collection Time: 11/05/23  9:17 AM   Specimen: BLOOD RIGHT FOREARM  Result Value Ref Range Status   Specimen Description BLOOD RIGHT FOREARM  Final   Special Requests   Final    BOTTLES DRAWN AEROBIC AND ANAEROBIC Blood Culture adequate volume   Culture   Final    NO GROWTH < 24 HOURS Performed at [facility], [address] [place], [place] [address]    Report Status PENDING  Incomplete  Blood Culture (routine x 2)     Status:  None (Preliminary result)   Collection Time: 11/05/23  9:27 AM   Specimen: BLOOD  Result Value Ref Range Status   Specimen Description BLOOD RIGHT ANTECUBITAL  Final   Special Requests   Final    BOTTLES DRAWN AEROBIC AND ANAEROBIC Blood Culture adequate volume   Culture   Final    NO GROWTH < 24 HOURS Performed at [facility], [address], [place], [place] [address]    Report Status PENDING  Incomplete  Resp panel by RT-PCR (RSV, Flu A&B, Covid) Anterior Nasal Swab     Status: None   Collection Time: 11/05/23  9:27 AM   Specimen: Anterior Nasal Swab  Result Value Ref Range Status   SARS Coronavirus 2 by RT PCR NEGATIVE NEGATIVE Final    Comment: (NOTE) SARS-CoV-2 target nucleic acids are NOT DETECTED.  The SARS-CoV-2 RNA is generally detectable in upper respiratory specimens during the acute phase of infection. The lowest concentration of SARS-CoV-2 viral copies this assay can detect is 138 copies/mL. A  negative result does not preclude SARS-Cov-2 infection and should not be used as the sole basis for treatment or other patient management decisions. A negative result may occur with  improper specimen collection/handling, submission of specimen other than nasopharyngeal swab, presence of viral mutation(s) within the areas targeted by this assay, and inadequate number of viral copies(<138 copies/mL). A negative result must be combined with clinical observations, patient history, and epidemiological information. The expected result is Negative.  Fact Sheet for Patients:  BloggerCourse.com  Fact Sheet for Healthcare Providers:  SeriousBroker.it  This test is no t yet approved or cleared by the [place]  FDA and  has been authorized for detection and/or diagnosis of SARS-CoV-2 by FDA under an Emergency Use Authorization (EUA). This EUA will remain  in effect (meaning this test can be used) for the duration of the COVID-19 declaration under Section 564(b)(1) of the Act, 21 U.S.C.section 360bbb-3(b)(1), unless the authorization is terminated  or revoked sooner.       Influenza A by PCR NEGATIVE NEGATIVE Final   Influenza B by PCR NEGATIVE NEGATIVE Final    Comment: (NOTE) The Xpert Xpress SARS-CoV-2/FLU/RSV plus assay is intended as an aid in the diagnosis of influenza from Nasopharyngeal swab specimens and should not be used as a sole basis for treatment. Nasal washings and aspirates are unacceptable for Xpert Xpress SARS-CoV-2/FLU/RSV testing.  Fact Sheet for Patients: BloggerCourse.com  Fact Sheet for Healthcare Providers: SeriousBroker.it  This test is not yet approved or cleared by the [place]  FDA and has been authorized for detection and/or diagnosis of SARS-CoV-2 by FDA under an Emergency Use Authorization (EUA). This EUA will remain in effect (meaning this test can  be used) for the duration of the COVID-19 declaration under Section 564(b)(1) of the Act, 21 U.S.C. section 360bbb-3(b)(1), unless the authorization is terminated or revoked.     Resp Syncytial Virus by PCR NEGATIVE NEGATIVE Final    Comment: (NOTE) Fact Sheet for Patients: BloggerCourse.com  Fact Sheet for Healthcare Providers: SeriousBroker.it  This test is not yet approved or cleared by the [place]  FDA and has been authorized for detection and/or diagnosis of SARS-CoV-2 by FDA under an Emergency Use Authorization (EUA). This EUA will remain in effect (meaning this test can be used) for the duration of the COVID-19 declaration under Section 564(b)(1) of the Act, 21 U.S.C. section 360bbb-3(b)(1), unless the authorization is terminated or revoked.  Performed at [facility], [address] [place],  [place] [address]          Radiology Studies: [facility] Chest Port 1 View Result Date: 11/05/2023 CLINICAL DATA:  Questionable sepsis. EXAM: PORTABLE CHEST 1 VIEW COMPARISON:  X-ray 10/22/2023 and older FINDINGS: Enlarged cardiopericardial silhouette with calcified aorta. Left upper chest pacemaker. Moderate left effusion. Tiny right. Vascular calcifications. No pneumothorax. Chronic lung changes. Interval changes with some vascular congestion. Right shoulder reverse arthroplasty seen. Films are under penetrated. Surgical Janae Mclean is along the left humeral head. IMPRESSION: Enlarged cardiopericardial silhouette calcified aorta and vascular shin. Chronic lung changes. Bilateral pleural effusions are seen, moderate left and small right. Adjacent opacity. Recommend follow-up. Electronically Signed   By: Adrianna Horde M.D.   On: 11/05/2023 12:45        Scheduled Meds:  amiodarone  200 mg Oral Daily   apixaban  2.5 mg Oral BID   ascorbic acid  250 mg Oral Daily   carvedilol  6.25 mg Oral BID WC   furosemide  40 mg Intravenous  Q12H   insulin aspart  0-5 Units Subcutaneous QHS   insulin aspart  0-9 Units Subcutaneous TID WC   isosorbide mononitrate  30 mg Oral QHS   traZODone  100 mg Oral QHS   Continuous Infusions:   LOS: 1 day    Tiajuana Fluke, MD [facility]   If 7PM-7AM, please contact night-coverage  11/06/2023, 2:54 PM

## 2023-11-06 NOTE — Evaluation (Signed)
 Physical Therapy Evaluation Patient Details Name: Leslie Parker MRN: [id number] DOB: November 02, 1932 Today's Date: 11/06/2023  History of Present Illness  Pt is an 89 year old female with history of COPD, heart failure reduced ejection fraction, CKD stage IV, non-insulin-dependent diabetes mellitus, hypertension, sick sinus syndrome, status post pacemaker placement in April 2024, paroxysmal atrial fibrillation on Eliquis, who presents ED for chief concerns of difficulty breathing.  MD assessment includes: acute exacerbation of CHF, AKI, SSS, frequent hospital admissions, and elevated troponin suspect secondary to demand ischemia in setting of fluid overload.   Clinical Impression  Pt was pleasant and motivated to participate during the session and put forth good effort throughout. Pt was able to perform sit to/from stand transfers from various height surfaces with good control and stability and performed multiple bouts of amb with a RW with no overt LOB.  Pt's SpO2 was 90% on room air at end of session after ambulation and was placed on 2L per nsg request with SpO2 increasing quickly to >/= 92%.  Patient reported is actively receiving HHPT/OT services but unsure of the agency.  Pt will benefit from continued PT services upon discharge to safely address deficits listed in patient problem list for decreased caregiver assistance and eventual return to PLOF.           If plan is discharge home, recommend the following: A little help with walking and/or transfers;A little help with bathing/dressing/bathroom;Assistance with cooking/housework;Assist for transportation   Can travel by private vehicle        Equipment Recommendations None recommended by PT  Recommendations for Other Services       Functional Status Assessment Patient has had a recent decline in their functional status and demonstrates the ability to make significant improvements in function in a reasonable and predictable amount of time.      Precautions / Restrictions Precautions Precautions: Fall Recall of Precautions/Restrictions: Intact Restrictions Weight Bearing Restrictions Per Provider Order: No      Mobility  Bed Mobility               General bed mobility comments: NT, seated pre/post session    Transfers Overall transfer level: Needs assistance Equipment used: Rolling walker (2 wheels) Transfers: Sit to/from Stand Sit to Stand: Supervision           General transfer comment: Good control and stability with transfers from various height surfaces    Ambulation/Gait Ambulation/Gait assistance: Supervision Gait Distance (Feet): 30 Feet x 1, 15 Feet x 1 Assistive device: Rolling walker (2 wheels) Gait Pattern/deviations: Step-through pattern, Decreased step length - right, Decreased step length - left, Trunk flexed Gait velocity: decreased     General Gait Details: Slow cadence but steady with no overt LOB including during start/stops and 180 deg turns  Careers information officer     Tilt Bed    Modified Rankin (Stroke Patients Only)       Balance Overall balance assessment: Needs assistance Sitting-balance support: No upper extremity supported, Feet supported Sitting balance-[name]: Good     Standing balance support: During functional activity, Bilateral upper extremity supported Standing balance-[name]: Fair                               Pertinent Vitals/Pain Pain Assessment Pain Assessment: No/denies pain    Home Living Family/patient expects to be discharged to:: Private residence Living Arrangements: Alone  Available Help at Discharge: Family;Available PRN/intermittently Type of Home: Apartment Home Access: Level entry       Home Layout: One level Home Equipment: Rollator (4 wheels);Cane - single point;Grab bars - tub/shower;Grab bars - toilet;Lift chair;Transport chair;Tub bench;Rolling Walker (2 wheels);Shower  seat Additional Comments: Senior living apartment with granddaughter able to stay with patient as needed, has call bells in multiple rooms for help if needed.  Reports daughter (from [place]) has been in town and planning to return home soon.    Prior Function Prior Level of Function : Needs assist             Mobility Comments: Mod Ind amb in the home with a rollator, no falls in the last 6 months, family assists pt in a transport chair for community access ADLs Comments: Granddaughter assists with IADLs including errands and obtaining groceries     Extremity/Trunk Assessment   Upper Extremity Assessment Upper Extremity Assessment: Overall WFL for tasks assessed    Lower Extremity Assessment Lower Extremity Assessment: Generalized weakness       Communication   Communication Communication: No apparent difficulties Factors Affecting Communication: Hearing impaired    Cognition Arousal: Alert Behavior During Therapy: WFL for tasks assessed/performed   PT - Cognitive impairments: No apparent impairments                         Following commands: Intact       Cueing Cueing Techniques: Verbal cues     General Comments General comments (skin integrity, edema, etc.): SpO2 92% on RA at rest, 88% with activity    Exercises     Assessment/Plan    PT Assessment Patient needs continued PT services  PT Problem List Decreased strength;Decreased activity tolerance;Decreased balance;Decreased mobility;Decreased knowledge of use of DME       PT Treatment Interventions DME instruction;Gait training;Functional mobility training;Therapeutic activities;Therapeutic exercise;Balance training;Patient/family education    PT Goals (Current goals can be found in the Care Plan section)  Acute Rehab PT Goals Patient Stated Goal: To return home PT Goal Formulation: With patient Time For Goal Achievement: 11/19/23 Potential to Achieve Goals: Good    Frequency Min 2X/week      Co-evaluation               AM-PAC PT "6 Clicks" Mobility  Outcome Measure Help needed turning from your back to your side while in a flat bed without using bedrails?: A Little Help needed moving from lying on your back to sitting on the side of a flat bed without using bedrails?: A Little Help needed moving to and from a bed to a chair (including a wheelchair)?: A Little Help needed standing up from a chair using your arms (e.g., wheelchair or bedside chair)?: A Little Help needed to walk in hospital room?: A Little Help needed climbing 3-5 steps with a railing? : A Little 6 Click Score: 18    End of Session Equipment Utilized During Treatment: Gait belt Activity Tolerance: Patient tolerated treatment well Patient left: with call bell/phone within reach;in chair;with chair alarm set;with nursing/sitter in room;Other (comment) (fall mat in place per nursing request) Nurse Communication: Mobility status;Other (comment) (SpO2 90% on room air at end of session, placed on 2L per nsg request) PT Visit Diagnosis: Muscle weakness (generalized) (M62.81);Difficulty in walking, not elsewhere classified (R26.2)    Time: 7846-9629 PT Time Calculation (min) (ACUTE ONLY): 18 min   Charges:   PT Evaluation $PT Eval Moderate Complexity:  1 Mod   PT General Charges $$ ACUTE PT VISIT: 1 Visit       D. Scott Mary Hockey PT, DPT 11/06/23, 11:09 AM

## 2023-11-06 NOTE — Evaluation (Signed)
 Occupational Therapy Evaluation Patient Details Name: Leslie Parker MRN: [id number] DOB: 03/23/1933 Today's Date: 11/06/2023   History of Present Illness   Leslie Parker is an 89 year old female with history of COPD, heart failure reduced ejection fraction, CKD stage IV, non-insulin-dependent diabetes mellitus, hypertension, sick sinus syndrome, status post pacemaker placement in April 2024, paroxysmal atrial fibrillation on Eliquis, who presents ED for chief concerns of difficulty breathing.    Clinical Impressions [name] was seen for OT evaluation this date. Prior to hospital admission, pt was MOD I using 4WW. Pt lives alone in senior living apartments, reports granddaughters visit daily. Pt currently requires MOD A don briefs, assist for threading over BLE. SBA + RW for toilet t/f and standing grooming tasks. SpO2 92% on RA at rest, 88% with activity. Pt would benefit from skilled OT to address noted impairments and functional limitations (see below for any additional details). Upon hospital discharge, recommend OT follow up.    If plan is discharge home, recommend the following:   A little help with walking and/or transfers;A little help with bathing/dressing/bathroom;Assistance with cooking/housework;Assist for transportation;Help with stairs or ramp for entrance     Functional Status Assessment   Patient has had a recent decline in their functional status and demonstrates the ability to make significant improvements in function in a reasonable and predictable amount of time.     Equipment Recommendations   None recommended by OT     Recommendations for Other Services         Precautions/Restrictions   Precautions Precautions: Fall Recall of Precautions/Restrictions: Intact Restrictions Weight Bearing Restrictions Per Provider Order: No     Mobility Bed Mobility Overal bed mobility: Modified Independent                  Transfers Overall transfer level:  Needs assistance Equipment used: Rolling walker (2 wheels) Transfers: Sit to/from Stand Sit to Stand: Contact guard assist                  Balance Overall balance assessment: Needs assistance Sitting-balance support: No upper extremity supported, Feet supported Sitting balance-[name]: Good     Standing balance support: No upper extremity supported, During functional activity Standing balance-[name]: Fair                             ADL either performed or assessed with clinical judgement   ADL Overall ADL's : Needs assistance/impaired                                       General ADL Comments: MOD A don briefs, assist for threading over BLE. SBA + RW for toilet t/f and standing grooming tasks.       Pertinent Vitals/Pain Pain Assessment Pain Assessment: No/denies pain     Extremity/Trunk Assessment Upper Extremity Assessment Upper Extremity Assessment: Overall WFL for tasks assessed   Lower Extremity Assessment Lower Extremity Assessment: Generalized weakness       Communication Communication Communication: Impaired Factors Affecting Communication: Hearing impaired   Cognition Arousal: Alert Behavior During Therapy: WFL for tasks assessed/performed Cognition: No family/caregiver present to determine baseline             OT - Cognition Comments: oriented to self, date, location, situation however ? hard of hearing vs mild cognitive deficits  Following commands: Intact       Cueing  General Comments   Cueing Techniques: Verbal cues  SpO2 92% on RA at rest, 88% with activity   Exercises     Shoulder Instructions      Home Living Family/patient expects to be discharged to:: Private residence Living Arrangements: Alone Available Help at Discharge: Family;Available PRN/intermittently Type of Home: Apartment Home Access: Level entry     Home Layout: One level     Bathroom  Shower/Tub: Producer, television/film/video: Handicapped height Bathroom Accessibility: Yes How Accessible: Accessible via walker Home Equipment: Rollator (4 wheels);Cane - single point;Grab bars - tub/shower;Grab bars - toilet;Lift chair;Transport chair;Tub bench;Rolling Walker (2 wheels);Shower seat   Additional Comments: Senior living apartment with granddaughter able to stay with patient as needed, has call bells in multiple rooms for help if needed.  Reports daughter (from [place]) has been in town and planning to return home soon      Prior Functioning/Environment Prior Level of Function : Needs assist             Mobility Comments: 4WW in home, transport chair in community ADLs Comments: family assists with IADLs    OT Problem List: Decreased strength;Decreased activity tolerance;Decreased safety awareness   OT Treatment/Interventions: Self-care/ADL training;Therapeutic exercise;Therapeutic activities;Energy conservation;DME and/or AE instruction;Patient/family education;Balance training      OT Goals(Current goals can be found in the care plan section)   Acute Rehab OT Goals Patient Stated Goal: go home OT Goal Formulation: With patient Time For Goal Achievement: 11/20/23 Potential to Achieve Goals: Fair ADL Goals Pt Will Perform Grooming: with modified independence;standing Pt Will Perform Lower Body Dressing: with modified independence;sit to/from stand Pt Will Transfer to Toilet: with modified independence;ambulating;regular height toilet   OT Frequency:  Min 2X/week    Co-evaluation              AM-PAC OT "6 Clicks" Daily Activity     Outcome Measure Help from another person eating meals?: None Help from another person taking care of personal grooming?: None Help from another person toileting, which includes using toliet, bedpan, or urinal?: A Little Help from another person bathing (including washing, rinsing, drying)?: A Little Help from another  person to put on and taking off regular upper body clothing?: A Little Help from another person to put on and taking off regular lower body clothing?: A Lot 6 Click Score: 19   End of Session Equipment Utilized During Treatment: Rolling walker (2 wheels) Nurse Communication: Mobility status  Activity Tolerance: Patient tolerated treatment well Patient left: in bed;with call bell/phone within reach (seated with PT)  OT Visit Diagnosis: Unsteadiness on feet (R26.81);Muscle weakness (generalized) (M62.81)                Time: 1610-9604 OT Time Calculation (min): 16 min Charges:  OT General Charges $OT Visit: 1 Visit OT Evaluation $OT Eval Moderate Complexity: 1 Mod  Gordan Latina, M.S. OTR/L  11/06/23, 10:11 AM  ascom [phone]

## 2023-11-06 NOTE — Progress Notes (Signed)
 Heart Failure Navigator Progress Note  Assessed for Heart & Vascular TOC clinic readiness.  Patient does not meet criteria due to Leslie Dellen, FNP patient as well as [facility] patient.  She is currently being followed by [facility].  Navigator available for reassessment of patient but will sign off at this time.  Celedonio Coil, RN, BSN [facility] Heart Failure Navigator Secure Chat Only

## 2023-11-07 ENCOUNTER — Other Ambulatory Visit: Payer: Self-pay

## 2023-11-07 DIAGNOSIS — N184 Chronic kidney disease, stage 4 (severe): Secondary | ICD-10-CM | POA: Diagnosis not present

## 2023-11-07 DIAGNOSIS — I5043 Acute on chronic combined systolic (congestive) and diastolic (congestive) heart failure: Secondary | ICD-10-CM | POA: Diagnosis not present

## 2023-11-07 DIAGNOSIS — I130 Hypertensive heart and chronic kidney disease with heart failure and stage 1 through stage 4 chronic kidney disease, or unspecified chronic kidney disease: Secondary | ICD-10-CM | POA: Diagnosis not present

## 2023-11-07 DIAGNOSIS — I5023 Acute on chronic systolic (congestive) heart failure: Secondary | ICD-10-CM | POA: Diagnosis not present

## 2023-11-07 DIAGNOSIS — E1122 Type 2 diabetes mellitus with diabetic chronic kidney disease: Secondary | ICD-10-CM | POA: Diagnosis not present

## 2023-11-07 DIAGNOSIS — I495 Sick sinus syndrome: Secondary | ICD-10-CM | POA: Diagnosis not present

## 2023-11-07 DIAGNOSIS — I480 Paroxysmal atrial fibrillation: Secondary | ICD-10-CM | POA: Diagnosis not present

## 2023-11-07 DIAGNOSIS — J4489 Other specified chronic obstructive pulmonary disease: Secondary | ICD-10-CM | POA: Diagnosis not present

## 2023-11-07 LAB — MAGNESIUM: Magnesium: 2.1 mg/dL (ref 1.7–2.4)

## 2023-11-07 LAB — BASIC METABOLIC PANEL WITH GFR
Anion gap: 10 (ref 5–15)
BUN: 71 mg/dL — ABNORMAL HIGH (ref 8–23)
CO2: 25 mmol/L (ref 22–32)
Calcium: 8.3 mg/dL — ABNORMAL LOW (ref 8.9–10.3)
Chloride: 103 mmol/L (ref 98–111)
Creatinine, Ser: 2.56 mg/dL — ABNORMAL HIGH (ref 0.44–1.00)
GFR, Estimated: 18 mL/min — ABNORMAL LOW (ref >60)
Glucose, Bld: 138 mg/dL — ABNORMAL HIGH (ref 70–99)
Potassium: 4.2 mmol/L (ref 3.5–5.1)
Sodium: 138 mmol/L (ref 135–145)

## 2023-11-07 LAB — GLUCOSE, CAPILLARY
Glucose-Capillary: 137 mg/dL — ABNORMAL HIGH (ref 70–99)
Glucose-Capillary: 146 mg/dL — ABNORMAL HIGH (ref 70–99)
Glucose-Capillary: 135 mg/dL — ABNORMAL HIGH (ref 70–99)
Glucose-Capillary: 101 mg/dL — ABNORMAL HIGH (ref 70–99)
Glucose-Capillary: 114 mg/dL — ABNORMAL HIGH (ref 70–99)

## 2023-11-07 MED ORDER — TORSEMIDE 20 MG PO TABS
20.0000 mg | ORAL_TABLET | Freq: Every day | ORAL | 0 refills | Status: DC
  Filled 2023-11-07 (×2): qty 30, 30d supply, fill #0

## 2023-11-07 NOTE — Plan of Care (Signed)

## 2023-11-07 NOTE — Progress Notes (Signed)
 Physical Therapy Treatment Patient Details Name: Leslie Parker MRN: [id number] DOB: April 28, 1933 Today's Date: 11/07/2023   History of Present Illness Pt is an 89 year old female with history of COPD, heart failure reduced ejection fraction, CKD stage IV, non-insulin-dependent diabetes mellitus, hypertension, sick sinus syndrome, status post pacemaker placement in April 2024, paroxysmal atrial fibrillation on Eliquis, who presents ED for chief concerns of difficulty breathing.  MD assessment includes: acute exacerbation of CHF, AKI, SSS, frequent hospital admissions, and elevated troponin suspect secondary to demand ischemia in setting of fluid overload.    PT Comments  Pt was long sitting in bed upon arrival. She is alert and agreeable to session. Easily and safely able to exit bed, stand to RW, and tolerate ambulate  ~ 70 ft without LOB or safety concern. Pt endorses eagerness to DC home and per pt is DCing this afternoon. Acute PT will continue to follow and progress per current POC progressing as able to maximize pt's safety with all ADLs.    If plan is discharge home, recommend the following: A little help with walking and/or transfers;A little help with bathing/dressing/bathroom;Assistance with cooking/housework;Assist for transportation     Equipment Recommendations  None recommended by PT       Precautions / Restrictions Precautions Precautions: Fall Recall of Precautions/Restrictions: Intact Restrictions Weight Bearing Restrictions Per Provider Order: No     Mobility  Bed Mobility Overal bed mobility: Modified Independent   Transfers Overall transfer level: Needs assistance Equipment used: Rolling walker (2 wheels) Transfers: Sit to/from Stand Sit to Stand: Supervision   Ambulation/Gait Ambulation/Gait assistance: Supervision Gait Distance (Feet): 70 Feet Assistive device: Rolling walker (2 wheels) Gait Pattern/deviations: Step-through pattern, Decreased step length -  right, Decreased step length - left, Trunk flexed Gait velocity: decreased  General Gait Details: no LOB with [place] eof RW   Balance Overall balance assessment: Needs assistance Sitting-balance support: No upper extremity supported, Feet supported Sitting balance-[name]: Good     Standing balance support: During functional activity, Bilateral upper extremity supported Standing balance-[name]: Fair       Hotel manager: No apparent difficulties  Cognition Arousal: Alert Behavior During Therapy: WFL for tasks assessed/performed   PT - Cognitive impairments: No apparent impairments    PT - Cognition Comments: Pt is A and agreeable to session. Excited to DC hoem this afternoon. Following commands: Intact      Cueing Cueing Techniques: Verbal cues         Pertinent Vitals/Pain Pain Assessment Pain Assessment: No/denies pain     PT Goals (current goals can now be found in the care plan section) Acute Rehab PT Goals Patient Stated Goal: To return home Progress towards PT goals: Progressing toward goals    Frequency    Min 2X/week       AM-PAC PT "6 Clicks" Mobility   Outcome Measure  Help needed turning from your back to your side while in a flat bed without using bedrails?: A Little Help needed moving from lying on your back to sitting on the side of a flat bed without using bedrails?: A Little Help needed moving to and from a bed to a chair (including a wheelchair)?: A Little Help needed standing up from a chair using your arms (e.g., wheelchair or bedside chair)?: A Little Help needed to walk in hospital room?: A Little Help needed climbing 3-5 steps with a railing? : A Little 6 Click Score: 18    End of Session Equipment Utilized During Treatment: Gait  belt Activity Tolerance: Patient tolerated treatment well Patient left: in chair;with call bell/phone within reach;with bed alarm set Nurse Communication: Mobility  status;Other (comment) PT Visit Diagnosis: Muscle weakness (generalized) (M62.81);Difficulty in walking, not elsewhere classified (R26.2)     Time: 1610-9604 PT Time Calculation (min) (ACUTE ONLY): 12 min  Charges:    $Gait Training: 8-22 mins PT General Charges $$ ACUTE PT VISIT: 1 Visit                     Chester Costa PTA 11/07/23, 12:37 PM

## 2023-11-07 NOTE — Plan of Care (Signed)
 PMT consult noted.  Notes reviewed.  Per notes patient is discharging from hospital.  PMT will sign off at this time.  Please reconsult if needs arise.

## 2023-11-07 NOTE — Discharge Summary (Signed)
 Physician Discharge Summary  Leslie Parker MVH:[id number] DOB: 11-21-1932 DOA: 11/05/2023  PCP: [name], MD  Admit date: 11/05/2023 Discharge date: 11/07/2023  Admitted From: Home Disposition:  Home with home health  Recommendations for Outpatient Follow-up:  Follow up with PCP in 1-2 weeks Follow up with outpatient cardiology  Home Health:Yes PT OT RN Aide  Equipment/Devices:None   Discharge Condition:Stable  CODE STATUS:FULL  Diet recommendation: Heart healthy  Brief/Interim Summary: 89 year old female with history of COPD, heart failure reduced ejection fraction, CKD stage IV, non-insulin-dependent diabetes mellitus, hypertension, sick sinus syndrome, status post pacemaker placement in April 2024, paroxysmal atrial fibrillation on Eliquis, who presents ED for chief concerns of difficulty breathing.   Per ED documentation: Patient was found to be hypoxic with SpO2 of 77% on room air via EMS and patient was placed on 4 L nasal cannula with improvement to 97%, DuoNebs one-time treatment, Solu-Medrol 125 mg IV one-time dose, magnesium 2 g IV one-time dose were given. Vitals in the ED showed temperature of 97.3, respiration rate 20, heart rate 76, blood pressure 178/84, SpO2 96% on 4 L nasal cannula.    Discharge Diagnoses:  Principal Problem:   Acute exacerbation of CHF (congestive heart failure) (HCC) Active Problems:   Sick sinus syndrome (HCC)   AKI (acute kidney injury) (HCC)   Paroxysmal atrial fibrillation (HCC)   Hypomagnesemia   Type II diabetes mellitus with renal manifestations (HCC)   CKD (chronic kidney disease) stage 4, GFR 15-29 ml/min (HCC)   HTN (hypertension), malignant   HFrEF (heart failure with reduced ejection fraction) (HCC)   Chronic a-fib (HCC)   Diabetes mellitus without complication (HCC)   Elevated troponin   Insomnia   Frequent hospital admissions   Acute exacerbation of CHF (congestive heart failure) (HCC) Recurrent admissions for the  same.  BNP consistently elevated over 4500.  Poor ejection fraction of 25 to 30%.  Prognosis poor. Plan: Patient will return to home dose of torsemide 20 mg daily.  Referred to outpatient palliative as patient has had 7 admissions since January 2025 for very similar issues.  She has advanced heart failure and likely difficulty with fluid retention and monitoring of p.o. fluid intake.  Would be a candidate for further de-escalation of care.  Recommend DNR status.   Frequent hospital admissions Patient struggles with taking care of herself Patient states that she has home health that comes to her home however she has been admitted to the hospital 6 times since January 2025 I discussed about going to a nursing home or assisted living facility Patient declined.  She is decisional She will benefit from placement in a monitored facility to minimize the risk of hospital readmission Outpatient palliative care referral placed     Discharge Instructions  Discharge Instructions     Amb Referral to Palliative Care   Complete by: As directed    Diet - low sodium heart healthy   Complete by: As directed    Increase activity slowly   Complete by: As directed       Allergies as of 11/07/2023       Reactions   Baclofen Other (See Comments)   Elevated Cr   Simvastatin Other (See Comments)   Pt denies Elevated LFTs with simva 40mg , stopped and resolved (see MD note 11/10/13)        Medication List     STOP taking these medications    furosemide 20 MG tablet Commonly known as: LASIX   furosemide 40 MG tablet  Commonly known as: LASIX   metoprolol tartrate 25 MG tablet Commonly known as: LOPRESSOR       TAKE these medications    acetaminophen 325 MG tablet Commonly known as: TYLENOL Take 2 tablets (650 mg total) by mouth every 4 (four) hours as needed for headache or mild pain (pain score 1-3).   albuterol 108 (90 Base) MCG/ACT inhaler Commonly known as: VENTOLIN HFA Inhale 2  puffs into the lungs every 6 (six) hours as needed.   amiodarone 200 MG tablet Commonly known as: PACERONE Take 1 tablet by mouth daily.   apixaban 2.5 MG Tabs tablet Commonly known as: ELIQUIS Take 1 tablet (2.5 mg total) by mouth 2 (two) times daily.   ascorbic acid 250 MG Chew Commonly known as: VITAMIN C Chew 250 mg by mouth daily.   B-12 2500 MCG Tabs Take 2,500 mcg by mouth daily.   carvedilol 6.25 MG tablet Commonly known as: COREG Take 1 tablet (6.25 mg total) by mouth 2 (two) times daily with a meal.   glipiZIDE 2.5 MG 24 hr tablet Commonly known as: GLUCOTROL XL Take 2.5 mg by mouth daily.   hydrALAZINE 50 MG tablet Commonly known as: APRESOLINE Take 1 tablet (50 mg total) by mouth 3 (three) times daily. What changed:  when to take this reasons to take this   ipratropium-albuterol 0.5-2.5 (3) MG/3ML Soln Commonly known as: DUONEB Inhale 3 mLs into the lungs every 6 (six) hours as needed.   isosorbide mononitrate 30 MG 24 hr tablet Commonly known as: IMDUR Take 1 tablet (30 mg total) by mouth at bedtime.   Magnesium Oxide -Mg Supplement 500 MG Caps Take 500 mg by mouth daily.   multivitamin with minerals Tabs tablet Take 1 tablet by mouth daily. One-A-Day Women's Vitamin   torsemide 20 MG tablet Commonly known as: DEMADEX Take 1 tablet (20 mg total) by mouth daily.   traZODone 100 MG tablet Commonly known as: DESYREL Take 100 mg by mouth at bedtime.   Trelegy Ellipta 100-62.5-25 MCG/ACT Aepb Generic drug: Fluticasone-Umeclidin-Vilant Inhale 1 puff into the lungs daily.   Vitamin D-1000 Max St 25 MCG (1000 UT) tablet Generic drug: Cholecalciferol Take 1,000 Units by mouth daily.        Allergies  Allergen Reactions   Baclofen Other (See Comments)    Elevated Cr   Simvastatin Other (See Comments)    Pt denies  Elevated LFTs with simva 40mg , stopped and resolved (see MD note 11/10/13)    Consultations: None   Procedures/Studies: DG  Chest Port 1 View Result Date: 11/05/2023 CLINICAL DATA:  Questionable sepsis. EXAM: PORTABLE CHEST 1 VIEW COMPARISON:  X-ray 10/22/2023 and older FINDINGS: Enlarged cardiopericardial silhouette with calcified aorta. Left upper chest pacemaker. Moderate left effusion. Tiny right. Vascular calcifications. No pneumothorax. Chronic lung changes. Interval changes with some vascular congestion. Right shoulder reverse arthroplasty seen. Films are under penetrated. Surgical Ines Bloomer is along the left humeral head. IMPRESSION: Enlarged cardiopericardial silhouette calcified aorta and vascular shin. Chronic lung changes. Bilateral pleural effusions are seen, moderate left and small right. Adjacent opacity. Recommend follow-up. Electronically Signed   By: Karen Kays M.D.   On: 11/05/2023 12:45   DG Chest Port 1 View Result Date: 10/22/2023 CLINICAL DATA:  [id number] Pain [id number].  Shortness of breath. EXAM: PORTABLE CHEST 1 VIEW COMPARISON:  10/12/2023. FINDINGS: Moderate diffuse pulmonary vascular congestion with bilateral layering pleural effusions, left greater than right, grossly similar to the prior study. There probable underlying compressive atelectatic changes at  the lung bases. Bilateral lung fields are otherwise clear. No pneumothorax. Evaluation of cardiomediastinal silhouette is nondiagnostic due to left lower hemithorax opacification. There is a left sided 2-lead pacemaker. No acute osseous abnormalities. Right reverse shoulder arthroplasty noted. The soft tissues are within normal limits. IMPRESSION: Moderate pulmonary vascular congestion with bilateral layering pleural effusions, left greater than right, grossly similar to the prior study and compatible with congestive heart failure/pulmonary edema. Electronically Signed   By: Jules Schick M.D.   On: 10/22/2023 10:49   DG Chest 1 View Result Date: 10/12/2023 CLINICAL DATA:  CHF. EXAM: CHEST  1 VIEW COMPARISON:  10/11/2023 FINDINGS: The cardio pericardial  silhouette is enlarged. Bibasilar atelectasis or infiltrate is progressive in the interval with small to moderate left and small right pleural effusions stable to minimally progressive since prior. Left-sided permanent pacemaker noted. Bones diffusely demineralized. Status post right shoulder replacement. IMPRESSION: Progressive bibasilar atelectasis or infiltrate with small to moderate left and small right pleural effusions. Electronically Signed   By: Kennith Center M.D.   On: 10/12/2023 06:57   DG Chest Portable 1 View Result Date: 10/11/2023 CLINICAL DATA:  Shortness of breath. EXAM: PORTABLE CHEST 1 VIEW COMPARISON:  09/15/2023. FINDINGS: Low lung volume. Moderate diffuse pulmonary vascular congestion and bilateral small layering pleural effusions. There are additional left retrocardiac opacities, which are similar to the prior study and may represent left lung atelectasis and/or consolidation. Bilateral lung fields are otherwise clear. No pneumothorax. Stable mildly enlarged cardio-mediastinal silhouette. There is a left sided 2-lead pacemaker. No acute osseous abnormalities. Right reverse shoulder arthroplasty noted. The soft tissues are within normal limits. IMPRESSION: Findings favor congestive heart failure/pulmonary edema. Electronically Signed   By: Jules Schick M.D.   On: 10/11/2023 10:46      Subjective: Seen and examined on the day of discharge.  Stable no distress.  Appropriate discharge home.  Discharge Exam: Vitals:   11/07/23 0800 11/07/23 1143  BP: (!) 170/85 (!) 145/78  Pulse:  64  Resp: 20 19  Temp: 97.7 F (36.5 C) 98.9 F (37.2 C)  SpO2: 94% 93%   Vitals:   11/07/23 0500 11/07/23 0558 11/07/23 0800 11/07/23 1143  BP:  (!) 157/77 (!) 170/85 (!) 145/78  Pulse:  69  64  Resp:  20 20 19   Temp:  98 F (36.7 C) 97.7 F (36.5 C) 98.9 F (37.2 C)  TempSrc:  Oral Oral Oral  SpO2:  92% 94% 93%  Weight: 77.8 kg     Height:        General: Pt is alert, awake, not in  acute distress Cardiovascular: RRR, S1/S2 +, no rubs, no gallops Respiratory: CTA bilaterally, no wheezing, no rhonchi Abdominal: Soft, NT, ND, bowel sounds + Extremities: no edema, no cyanosis    The results of significant diagnostics from this hospitalization (including imaging, microbiology, ancillary and laboratory) are listed below for reference.     Microbiology: Recent Results (from the past 240 hours)  Blood Culture (routine x 2)     Status: None (Preliminary result)   Collection Time: 11/05/23  9:17 AM   Specimen: BLOOD RIGHT FOREARM  Result Value Ref Range Status   Specimen Description BLOOD RIGHT FOREARM  Final   Special Requests   Final    BOTTLES DRAWN AEROBIC AND ANAEROBIC Blood Culture adequate volume   Culture   Final    NO GROWTH 2 DAYS Performed at [facility], [address], [place], [place] [address]    Report Status  PENDING  Incomplete  Blood Culture (routine x 2)     Status: None (Preliminary result)   Collection Time: 11/05/23  9:27 AM   Specimen: BLOOD  Result Value Ref Range Status   Specimen Description BLOOD RIGHT ANTECUBITAL  Final   Special Requests   Final    BOTTLES DRAWN AEROBIC AND ANAEROBIC Blood Culture adequate volume   Culture   Final    NO GROWTH 2 DAYS Performed at [facility], [address] [place], [place] [address]    Report Status PENDING  Incomplete  Resp panel by RT-PCR (RSV, Flu A&B, Covid) Anterior Nasal Swab     Status: None   Collection Time: 11/05/23  9:27 AM   Specimen: Anterior Nasal Swab  Result Value Ref Range Status   SARS Coronavirus 2 by RT PCR NEGATIVE NEGATIVE Final    Comment: (NOTE) SARS-CoV-2 target nucleic acids are NOT DETECTED.  The SARS-CoV-2 RNA is generally detectable in upper respiratory specimens during the acute phase of infection. The lowest concentration of SARS-CoV-2 viral copies this assay can detect is 138 copies/mL. A negative result does not preclude  SARS-Cov-2 infection and should not be used as the sole basis for treatment or other patient management decisions. A negative result may occur with  improper specimen collection/handling, submission of specimen other than nasopharyngeal swab, presence of viral mutation(s) within the areas targeted by this assay, and inadequate number of viral copies(<138 copies/mL). A negative result must be combined with clinical observations, patient history, and epidemiological information. The expected result is Negative.  Fact Sheet for Patients:  BloggerCourse.com  Fact Sheet for Healthcare Providers:  SeriousBroker.it  This test is no t yet approved or cleared by the [place]  FDA and  has been authorized for detection and/or diagnosis of SARS-CoV-2 by FDA under an Emergency Use Authorization (EUA). This EUA will remain  in effect (meaning this test can be used) for the duration of the COVID-19 declaration under Section 564(b)(1) of the Act, 21 U.S.C.section 360bbb-3(b)(1), unless the authorization is terminated  or revoked sooner.       Influenza A by PCR NEGATIVE NEGATIVE Final   Influenza B by PCR NEGATIVE NEGATIVE Final    Comment: (NOTE) The Xpert Xpress SARS-CoV-2/FLU/RSV plus assay is intended as an aid in the diagnosis of influenza from Nasopharyngeal swab specimens and should not be used as a sole basis for treatment. Nasal washings and aspirates are unacceptable for Xpert Xpress SARS-CoV-2/FLU/RSV testing.  Fact Sheet for Patients: BloggerCourse.com  Fact Sheet for Healthcare Providers: SeriousBroker.it  This test is not yet approved or cleared by the [place]  FDA and has been authorized for detection and/or diagnosis of SARS-CoV-2 by FDA under an Emergency Use Authorization (EUA). This EUA will remain in effect (meaning this test can be used) for the duration of  the COVID-19 declaration under Section 564(b)(1) of the Act, 21 U.S.C. section 360bbb-3(b)(1), unless the authorization is terminated or revoked.     Resp Syncytial Virus by PCR NEGATIVE NEGATIVE Final    Comment: (NOTE) Fact Sheet for Patients: BloggerCourse.com  Fact Sheet for Healthcare Providers: SeriousBroker.it  This test is not yet approved or cleared by the [place]  FDA and has been authorized for detection and/or diagnosis of SARS-CoV-2 by FDA under an Emergency Use Authorization (EUA). This EUA will remain in effect (meaning this test can be used) for the duration of the COVID-19 declaration under Section 564(b)(1) of the Act, 21 U.S.C. section 360bbb-3(b)(1), unless the authorization is terminated  or revoked.  Performed at [facility], [address] [place], [place] [address]      Labs: BNP (last 3 results) Recent Labs    10/11/23 1026 10/22/23 0857 11/05/23 0927  BNP >4,500.0* >4,500.0* >4,500.0*   Basic Metabolic Panel: Recent Labs  Lab 11/05/23 0927 11/06/23 0414 11/07/23 0839  NA 137 138 138  K 3.6 3.7 4.2  CL 102 105 103  CO2 24 24 25   GLUCOSE 150* 189* 138*  BUN 52* 57* 71*  CREATININE 2.29* 2.20* 2.56*  CALCIUM 8.5* 8.2* 8.3*  MG 2.3  --  2.1   Liver Function Tests: Recent Labs  Lab 11/05/23 0927  AST 37  ALT 30  ALKPHOS 102  BILITOT 1.5*  PROT 6.2*  ALBUMIN 3.1*   No results for input(s): "LIPASE", "AMYLASE" in the last 168 hours. No results for input(s): "AMMONIA" in the last 168 hours. CBC: Recent Labs  Lab 11/05/23 0927 11/06/23 0414  WBC 6.3 5.3  NEUTROABS 4.2  --   HGB 11.6* 10.3*  HCT 35.4* 30.3*  MCV 93.9 92.4  PLT 338 268   Cardiac Enzymes: No results for input(s): "CKTOTAL", "CKMB", "CKMBINDEX", "TROPONINI" in the last 168 hours. BNP: Invalid input(s): "POCBNP" CBG: Recent Labs  Lab 11/06/23 1619 11/06/23 2116 11/07/23 0758 11/07/23 0829  11/07/23 1139  GLUCAP 199* 165* 146* 137* 135*   D-Dimer No results for input(s): "DDIMER" in the last 72 hours. Hgb A1c No results for input(s): "HGBA1C" in the last 72 hours. Lipid Profile No results for input(s): "CHOL", "HDL", "LDLCALC", "TRIG", "CHOLHDL", "LDLDIRECT" in the last 72 hours. Thyroid function studies No results for input(s): "TSH", "T4TOTAL", "T3FREE", "THYROIDAB" in the last 72 hours.  Invalid input(s): "FREET3" Anemia work up No results for input(s): "VITAMINB12", "FOLATE", "FERRITIN", "TIBC", "IRON", "RETICCTPCT" in the last 72 hours. Urinalysis    Component Value Date/Time   COLORURINE YELLOW (A) 11/05/2023 1505   APPEARANCEUR CLEAR (A) 11/05/2023 1505   APPEARANCEUR Clear 02/19/2013 0254   LABSPEC 1.012 11/05/2023 1505   LABSPEC 1.006 02/19/2013 0254   PHURINE 6.0 11/05/2023 1505   GLUCOSEU NEGATIVE 11/05/2023 1505   GLUCOSEU Negative 02/19/2013 0254   HGBUR NEGATIVE 11/05/2023 1505   BILIRUBINUR NEGATIVE 11/05/2023 1505   BILIRUBINUR Negative 02/19/2013 0254   KETONESUR NEGATIVE 11/05/2023 1505   PROTEINUR 100 (A) 11/05/2023 1505   NITRITE NEGATIVE 11/05/2023 1505   LEUKOCYTESUR NEGATIVE 11/05/2023 1505   LEUKOCYTESUR 1+ 02/19/2013 0254   Sepsis Labs Recent Labs  Lab 11/05/23 0927 11/06/23 0414  WBC 6.3 5.3   Microbiology Recent Results (from the past 240 hours)  Blood Culture (routine x 2)     Status: None (Preliminary result)   Collection Time: 11/05/23  9:17 AM   Specimen: BLOOD RIGHT FOREARM  Result Value Ref Range Status   Specimen Description BLOOD RIGHT FOREARM  Final   Special Requests   Final    BOTTLES DRAWN AEROBIC AND ANAEROBIC Blood Culture adequate volume   Culture   Final    NO GROWTH 2 DAYS Performed at [facility], [address] [place], [place] [address]    Report Status PENDING  Incomplete  Blood Culture (routine x 2)     Status: None (Preliminary result)   Collection Time: 11/05/23  9:27 AM   Specimen:  BLOOD  Result Value Ref Range Status   Specimen Description BLOOD RIGHT ANTECUBITAL  Final   Special Requests   Final    BOTTLES DRAWN AEROBIC AND ANAEROBIC Blood Culture  adequate volume   Culture   Final    NO GROWTH 2 DAYS Performed at [facility], [address] [place], [place] [address]    Report Status PENDING  Incomplete  Resp panel by RT-PCR (RSV, Flu A&B, Covid) Anterior Nasal Swab     Status: None   Collection Time: 11/05/23  9:27 AM   Specimen: Anterior Nasal Swab  Result Value Ref Range Status   SARS Coronavirus 2 by RT PCR NEGATIVE NEGATIVE Final    Comment: (NOTE) SARS-CoV-2 target nucleic acids are NOT DETECTED.  The SARS-CoV-2 RNA is generally detectable in upper respiratory specimens during the acute phase of infection. The lowest concentration of SARS-CoV-2 viral copies this assay can detect is 138 copies/mL. A negative result does not preclude SARS-Cov-2 infection and should not be used as the sole basis for treatment or other patient management decisions. A negative result may occur with  improper specimen collection/handling, submission of specimen other than nasopharyngeal swab, presence of viral mutation(s) within the areas targeted by this assay, and inadequate number of viral copies(<138 copies/mL). A negative result must be combined with clinical observations, patient history, and epidemiological information. The expected result is Negative.  Fact Sheet for Patients:  BloggerCourse.com  Fact Sheet for Healthcare Providers:  SeriousBroker.it  This test is no t yet approved or cleared by the [place] FDA and  has been authorized for detection and/or diagnosis of SARS-CoV-2 by FDA under an Emergency Use Authorization (EUA). This EUA will remain  in effect (meaning this test can be used) for the duration of the COVID-19 declaration under Section 564(b)(1) of the Act, 21 U.S.C.section  360bbb-3(b)(1), unless the authorization is terminated  or revoked sooner.       Influenza A by PCR NEGATIVE NEGATIVE Final   Influenza B by PCR NEGATIVE NEGATIVE Final    Comment: (NOTE) The Xpert Xpress SARS-CoV-2/FLU/RSV plus assay is intended as an aid in the diagnosis of influenza from Nasopharyngeal swab specimens and should not be used as a sole basis for treatment. Nasal washings and aspirates are unacceptable for Xpert Xpress SARS-CoV-2/FLU/RSV testing.  Fact Sheet for Patients: BloggerCourse.com  Fact Sheet for Healthcare Providers: SeriousBroker.it  This test is not yet approved or cleared by the [place] FDA and has been authorized for detection and/or diagnosis of SARS-CoV-2 by FDA under an Emergency Use Authorization (EUA). This EUA will remain in effect (meaning this test can be used) for the duration of the COVID-19 declaration under Section 564(b)(1) of the Act, 21 U.S.C. section 360bbb-3(b)(1), unless the authorization is terminated or revoked.     Resp Syncytial Virus by PCR NEGATIVE NEGATIVE Final    Comment: (NOTE) Fact Sheet for Patients: BloggerCourse.com  Fact Sheet for Healthcare Providers: SeriousBroker.it  This test is not yet approved or cleared by the [place] FDA and has been authorized for detection and/or diagnosis of SARS-CoV-2 by FDA under an Emergency Use Authorization (EUA). This EUA will remain in effect (meaning this test can be used) for the duration of the COVID-19 declaration under Section 564(b)(1) of the Act, 21 U.S.C. section 360bbb-3(b)(1), unless the authorization is terminated or revoked.  Performed at [facility], [address] [place], [place] [address]      Time coordinating discharge: Over 30 minutes  SIGNED:   Tresa Moore, MD  [facility] 11/07/2023, 1:24 PM Pager   If  7PM-7AM, please contact night-coverage

## 2023-11-07 NOTE — TOC Transition Note (Signed)
 Transition of Care [facility]) - Discharge Note   Patient Details  Name: Leslie Parker MRN: [id number] Date of Birth: 1933/07/04  [facility] of [facility]) CM/SW Contact:  Leslie Tarleton C Conlan Miceli, RN Phone Number: 11/07/2023, 3:52 PM   Clinical Narrative:    Spoke with patient regarding discharge. Patient stated some agency was already coming out to see her but she could not recall the name. Patient's granddaughter Leslie Parker will pick her up.  Spoke with patient's granddaughter Leslie Parker. She stated she is unsure of agency name. She would like to be called by the agency once located. Leslie Parker confirmed she will transport patient later this evening.    RNCM contacted Leslie Parker from [place]. Patient is active for RN, PT/ OT, . She was advised HH aide was added She was advised aide was added.   TOC signing off.         Patient Goals and CMS Choice            Discharge Placement                       Discharge Plan and Services Additional resources added to the After Visit Summary for                                       Social Drivers of Health (SDOH) Interventions SDOH Screenings   Food Insecurity: No Food Insecurity (11/05/2023)  Housing: Low Risk  (11/05/2023)  Transportation Needs: No Transportation Needs (11/05/2023)  Utilities: Not At Risk (11/05/2023)  Alcohol Screen: Low Risk  (08/20/2023)  Financial Resource Strain: Low Risk  (08/20/2023)  Social Connections: Moderately Integrated (11/05/2023)  Tobacco Use: Low Risk  (11/05/2023)     Readmission Risk Interventions    10/22/2023    2:28 PM 09/16/2023    2:52 PM 08/20/2023    3:45 PM  Readmission Risk Prevention Plan  Transportation Screening Complete Complete Complete  Medication Review Oceanographer) Complete Complete Complete  PCP or Specialist appointment within 3-5 days of discharge  Complete Complete  HRI or Home Care Consult Not Complete Patient refused Complete  HRI or Home Care Consult Pt Refusal Comments  No PT recs at this time.    SW Recovery Care/Counseling Consult Complete Complete Complete  Palliative Care Screening Not Applicable Not Applicable Not Applicable  Skilled Nursing Facility Not Applicable Not Applicable Not Applicable

## 2023-11-09 ENCOUNTER — Inpatient Hospital Stay
Admission: EM | Admit: 2023-11-09 | Discharge: 2023-11-12 | DRG: 291 | Disposition: A | Discharge status: Home Health | Attending: Osteopathic Medicine | Admitting: Osteopathic Medicine

## 2023-11-09 ENCOUNTER — Emergency Department

## 2023-11-09 DIAGNOSIS — E663 Overweight: Secondary | ICD-10-CM | POA: Diagnosis not present

## 2023-11-09 DIAGNOSIS — I5023 Acute on chronic systolic (congestive) heart failure: Secondary | ICD-10-CM | POA: Diagnosis present

## 2023-11-09 DIAGNOSIS — J918 Pleural effusion in other conditions classified elsewhere: Secondary | ICD-10-CM | POA: Diagnosis present

## 2023-11-09 DIAGNOSIS — I130 Hypertensive heart and chronic kidney disease with heart failure and stage 1 through stage 4 chronic kidney disease, or unspecified chronic kidney disease: Secondary | ICD-10-CM | POA: Diagnosis present

## 2023-11-09 DIAGNOSIS — N184 Chronic kidney disease, stage 4 (severe): Secondary | ICD-10-CM | POA: Diagnosis present

## 2023-11-09 DIAGNOSIS — E1122 Type 2 diabetes mellitus with diabetic chronic kidney disease: Secondary | ICD-10-CM | POA: Diagnosis present

## 2023-11-09 DIAGNOSIS — I482 Chronic atrial fibrillation, unspecified: Secondary | ICD-10-CM | POA: Diagnosis present

## 2023-11-09 DIAGNOSIS — Z66 Do not resuscitate: Secondary | ICD-10-CM | POA: Diagnosis present

## 2023-11-09 DIAGNOSIS — Z96653 Presence of artificial knee joint, bilateral: Secondary | ICD-10-CM | POA: Diagnosis present

## 2023-11-09 DIAGNOSIS — Z9841 Cataract extraction status, right eye: Secondary | ICD-10-CM

## 2023-11-09 DIAGNOSIS — Z7984 Long term (current) use of oral hypoglycemic drugs: Secondary | ICD-10-CM

## 2023-11-09 DIAGNOSIS — J90 Pleural effusion, not elsewhere classified: Secondary | ICD-10-CM | POA: Diagnosis not present

## 2023-11-09 DIAGNOSIS — Z79899 Other long term (current) drug therapy: Secondary | ICD-10-CM

## 2023-11-09 DIAGNOSIS — I110 Hypertensive heart disease with heart failure: Secondary | ICD-10-CM | POA: Diagnosis not present

## 2023-11-09 DIAGNOSIS — I5A Non-ischemic myocardial injury (non-traumatic): Secondary | ICD-10-CM | POA: Diagnosis present

## 2023-11-09 DIAGNOSIS — Z8249 Family history of ischemic heart disease and other diseases of the circulatory system: Secondary | ICD-10-CM | POA: Diagnosis not present

## 2023-11-09 DIAGNOSIS — R0989 Other specified symptoms and signs involving the circulatory and respiratory systems: Secondary | ICD-10-CM | POA: Diagnosis not present

## 2023-11-09 DIAGNOSIS — N189 Chronic kidney disease, unspecified: Secondary | ICD-10-CM | POA: Diagnosis not present

## 2023-11-09 DIAGNOSIS — I2489 Other forms of acute ischemic heart disease: Secondary | ICD-10-CM | POA: Diagnosis present

## 2023-11-09 DIAGNOSIS — Z9842 Cataract extraction status, left eye: Secondary | ICD-10-CM

## 2023-11-09 DIAGNOSIS — J9811 Atelectasis: Secondary | ICD-10-CM | POA: Diagnosis present

## 2023-11-09 DIAGNOSIS — Z96611 Presence of right artificial shoulder joint: Secondary | ICD-10-CM | POA: Diagnosis present

## 2023-11-09 DIAGNOSIS — R0602 Shortness of breath: Secondary | ICD-10-CM | POA: Diagnosis not present

## 2023-11-09 DIAGNOSIS — I480 Paroxysmal atrial fibrillation: Secondary | ICD-10-CM | POA: Diagnosis present

## 2023-11-09 DIAGNOSIS — I509 Heart failure, unspecified: Secondary | ICD-10-CM | POA: Diagnosis not present

## 2023-11-09 DIAGNOSIS — E1129 Type 2 diabetes mellitus with other diabetic kidney complication: Secondary | ICD-10-CM | POA: Diagnosis present

## 2023-11-09 DIAGNOSIS — Z6841 Body Mass Index (BMI) 40.0 and over, adult: Secondary | ICD-10-CM | POA: Diagnosis not present

## 2023-11-09 DIAGNOSIS — J449 Chronic obstructive pulmonary disease, unspecified: Secondary | ICD-10-CM | POA: Diagnosis present

## 2023-11-09 DIAGNOSIS — I10 Essential (primary) hypertension: Secondary | ICD-10-CM | POA: Diagnosis present

## 2023-11-09 DIAGNOSIS — I495 Sick sinus syndrome: Secondary | ICD-10-CM | POA: Diagnosis present

## 2023-11-09 DIAGNOSIS — Z1152 Encounter for screening for COVID-19: Secondary | ICD-10-CM | POA: Diagnosis not present

## 2023-11-09 DIAGNOSIS — Z7901 Long term (current) use of anticoagulants: Secondary | ICD-10-CM | POA: Diagnosis not present

## 2023-11-09 DIAGNOSIS — Z888 Allergy status to other drugs, medicaments and biological substances status: Secondary | ICD-10-CM

## 2023-11-09 DIAGNOSIS — E785 Hyperlipidemia, unspecified: Secondary | ICD-10-CM | POA: Diagnosis present

## 2023-11-09 DIAGNOSIS — Z96642 Presence of left artificial hip joint: Secondary | ICD-10-CM | POA: Diagnosis present

## 2023-11-09 DIAGNOSIS — Z825 Family history of asthma and other chronic lower respiratory diseases: Secondary | ICD-10-CM

## 2023-11-09 DIAGNOSIS — Z961 Presence of intraocular lens: Secondary | ICD-10-CM | POA: Diagnosis present

## 2023-11-09 DIAGNOSIS — R062 Wheezing: Secondary | ICD-10-CM | POA: Diagnosis not present

## 2023-11-09 DIAGNOSIS — Z48813 Encounter for surgical aftercare following surgery on the respiratory system: Secondary | ICD-10-CM | POA: Diagnosis not present

## 2023-11-09 DIAGNOSIS — R0600 Dyspnea, unspecified: Secondary | ICD-10-CM

## 2023-11-09 DIAGNOSIS — Z7951 Long term (current) use of inhaled steroids: Secondary | ICD-10-CM

## 2023-11-09 DIAGNOSIS — Z9889 Other specified postprocedural states: Secondary | ICD-10-CM

## 2023-11-09 DIAGNOSIS — R918 Other nonspecific abnormal finding of lung field: Secondary | ICD-10-CM | POA: Diagnosis not present

## 2023-11-09 LAB — CBC
HCT: 32.9 % — ABNORMAL LOW (ref 36.0–46.0)
Hemoglobin: 10.9 g/dL — ABNORMAL LOW (ref 12.0–15.0)
MCH: 31 pg (ref 26.0–34.0)
MCHC: 33.1 g/dL (ref 30.0–36.0)
MCV: 93.5 fL (ref 80.0–100.0)
Platelets: 285 10*3/uL (ref 150–400)
RBC: 3.52 MIL/uL — ABNORMAL LOW (ref 3.87–5.11)
RDW: 15.4 % (ref 11.5–15.5)
WBC: 5.9 10*3/uL (ref 4.0–10.5)
nRBC: 0 % (ref 0.0–0.2)

## 2023-11-09 LAB — BASIC METABOLIC PANEL WITH GFR
Anion gap: 12 (ref 5–15)
BUN: 65 mg/dL — ABNORMAL HIGH (ref 8–23)
CO2: 25 mmol/L (ref 22–32)
Calcium: 8.3 mg/dL — ABNORMAL LOW (ref 8.9–10.3)
Chloride: 101 mmol/L (ref 98–111)
Creatinine, Ser: 2.24 mg/dL — ABNORMAL HIGH (ref 0.44–1.00)
GFR, Estimated: 21 mL/min — ABNORMAL LOW (ref >60)
Glucose, Bld: 111 mg/dL — ABNORMAL HIGH (ref 70–99)
Potassium: 3.6 mmol/L (ref 3.5–5.1)
Sodium: 138 mmol/L (ref 135–145)

## 2023-11-09 LAB — RESP PANEL BY RT-PCR (RSV, FLU A&B, COVID)  RVPGX2
Influenza A by PCR: NEGATIVE
Influenza B by PCR: NEGATIVE
Resp Syncytial Virus by PCR: NEGATIVE
SARS Coronavirus 2 by RT PCR: NEGATIVE

## 2023-11-09 LAB — CBG MONITORING, ED: Glucose-Capillary: 105 mg/dL — ABNORMAL HIGH (ref 70–99)

## 2023-11-09 LAB — PROTIME-INR
INR: 1.4 — ABNORMAL HIGH (ref 0.8–1.2)
Prothrombin Time: 17.7 s — ABNORMAL HIGH (ref 11.4–15.2)

## 2023-11-09 LAB — TROPONIN I (HIGH SENSITIVITY)
Troponin I (High Sensitivity): 46 ng/L — ABNORMAL HIGH (ref <18)
Troponin I (High Sensitivity): 53 ng/L — ABNORMAL HIGH (ref <18)
Troponin I (High Sensitivity): 50 ng/L — ABNORMAL HIGH (ref <18)

## 2023-11-09 LAB — BRAIN NATRIURETIC PEPTIDE: B Natriuretic Peptide: >4500 pg/mL — ABNORMAL HIGH (ref 0.0–100.0)

## 2023-11-09 MED ORDER — ALBUTEROL SULFATE (2.5 MG/3ML) 0.083% IN NEBU: 2.5000 mg | INHALATION_SOLUTION | RESPIRATORY_TRACT | Status: DC | PRN

## 2023-11-09 MED ORDER — VITAMIN D 25 MCG (1000 UNIT) PO TABS
1000.0000 [IU] | ORAL_TABLET | Freq: Every day | ORAL | Status: DC
Start: 2023-11-10 — End: 2023-11-13
  Administered 2023-11-10 – 2023-11-12 (×3): 1000 [IU] via ORAL
  Filled 2023-11-09 (×3): qty 1

## 2023-11-09 MED ORDER — VITAMIN C 500 MG PO TABS
250.0000 mg | ORAL_TABLET | Freq: Every day | ORAL | Status: DC
  Administered 2023-11-10 – 2023-11-12 (×3): 250 mg via ORAL
  Filled 2023-11-09 (×3): qty 1

## 2023-11-09 MED ORDER — ACETAMINOPHEN 325 MG PO TABS
650.0000 mg | ORAL_TABLET | Freq: Four times a day (QID) | ORAL | Status: DC | PRN
  Administered 2023-11-09: 650 mg via ORAL
  Filled 2023-11-09: qty 2

## 2023-11-09 MED ORDER — CARVEDILOL 6.25 MG PO TABS
6.2500 mg | ORAL_TABLET | Freq: Two times a day (BID) | ORAL | Status: DC
  Administered 2023-11-10: 6.25 mg via ORAL
  Filled 2023-11-09 (×2): qty 1

## 2023-11-09 MED ORDER — BUDESON-GLYCOPYRROL-FORMOTEROL 160-9-4.8 MCG/ACT IN AERO
2.0000 | INHALATION_SPRAY | Freq: Two times a day (BID) | RESPIRATORY_TRACT | Status: DC
  Administered 2023-11-09 – 2023-11-10 (×2): 2 via RESPIRATORY_TRACT
  Filled 2023-11-09 (×4): qty 5.9

## 2023-11-09 MED ORDER — ADULT MULTIVITAMIN W/MINERALS CH
1.0000 | ORAL_TABLET | Freq: Every day | ORAL | Status: DC
  Administered 2023-11-10 – 2023-11-12 (×3): 1 via ORAL
  Filled 2023-11-09 (×3): qty 1

## 2023-11-09 MED ORDER — INSULIN ASPART 100 UNIT/ML IJ SOLN
0.0000 [IU] | Freq: Three times a day (TID) | INTRAMUSCULAR | Status: DC
  Administered 2023-11-11: 2 [IU] via SUBCUTANEOUS
  Administered 2023-11-12: 1 [IU] via SUBCUTANEOUS
  Filled 2023-11-09 (×3): qty 1

## 2023-11-09 MED ORDER — DM-GUAIFENESIN ER 30-600 MG PO TB12: 1.0000 | ORAL_TABLET | Freq: Two times a day (BID) | ORAL | Status: DC | PRN

## 2023-11-09 MED ORDER — TRAZODONE HCL 100 MG PO TABS
100.0000 mg | ORAL_TABLET | Freq: Every day | ORAL | Status: DC
  Administered 2023-11-09 – 2023-11-11 (×3): 100 mg via ORAL
  Filled 2023-11-09 (×3): qty 1

## 2023-11-09 MED ORDER — FUROSEMIDE 10 MG/ML IJ SOLN
40.0000 mg | Freq: Once | INTRAMUSCULAR | Status: AC
  Administered 2023-11-09: 40 mg via INTRAVENOUS
  Filled 2023-11-09: qty 4

## 2023-11-09 MED ORDER — ONDANSETRON HCL 4 MG/2ML IJ SOLN: 4.0000 mg | Freq: Three times a day (TID) | INTRAMUSCULAR | Status: DC | PRN

## 2023-11-09 MED ORDER — INSULIN ASPART 100 UNIT/ML IJ SOLN: 0.0000 [IU] | Freq: Every day | INTRAMUSCULAR | Status: DC

## 2023-11-09 MED ORDER — APIXABAN 2.5 MG PO TABS
2.5000 mg | ORAL_TABLET | Freq: Two times a day (BID) | ORAL | Status: DC
  Administered 2023-11-09 – 2023-11-12 (×6): 2.5 mg via ORAL
  Filled 2023-11-09 (×7): qty 1

## 2023-11-09 MED ORDER — FUROSEMIDE 10 MG/ML IJ SOLN
40.0000 mg | Freq: Two times a day (BID) | INTRAMUSCULAR | Status: DC
  Administered 2023-11-09 – 2023-11-12 (×6): 40 mg via INTRAVENOUS
  Filled 2023-11-09 (×6): qty 4

## 2023-11-09 MED ORDER — HYDRALAZINE HCL 20 MG/ML IJ SOLN
5.0000 mg | INTRAMUSCULAR | Status: DC | PRN
  Administered 2023-11-09 – 2023-11-10 (×2): 5 mg via INTRAVENOUS
  Filled 2023-11-09 (×2): qty 1

## 2023-11-09 MED ORDER — AMIODARONE HCL 200 MG PO TABS
200.0000 mg | ORAL_TABLET | Freq: Every day | ORAL | Status: DC
  Administered 2023-11-10 – 2023-11-12 (×3): 200 mg via ORAL
  Filled 2023-11-09 (×3): qty 1

## 2023-11-09 MED ORDER — VITAMIN B-12 1000 MCG PO TABS
2500.0000 ug | ORAL_TABLET | Freq: Every day | ORAL | Status: DC
  Administered 2023-11-10 – 2023-11-12 (×3): 2500 ug via ORAL
  Filled 2023-11-09: qty 2.5
  Filled 2023-11-09 (×2): qty 3

## 2023-11-09 MED ORDER — MAGNESIUM OXIDE -MG SUPPLEMENT 400 (240 MG) MG PO TABS
400.0000 mg | ORAL_TABLET | Freq: Every day | ORAL | Status: DC
  Administered 2023-11-10 – 2023-11-12 (×3): 400 mg via ORAL
  Filled 2023-11-09 (×3): qty 1

## 2023-11-09 MED ORDER — ISOSORBIDE MONONITRATE ER 30 MG PO TB24
30.0000 mg | ORAL_TABLET | Freq: Every day | ORAL | Status: DC
  Administered 2023-11-09 – 2023-11-11 (×3): 30 mg via ORAL
  Filled 2023-11-09 (×3): qty 1

## 2023-11-09 NOTE — H&P (Addendum)
 History and Physical    Leslie Parker ZOX:[id number] DOB: Jan 19, 1933 DOA: 11/09/2023  Referring MD/NP/PA:   PCP: Rex Castor, MD   Patient coming from:  The patient is coming from home.     Chief Complaint: SOB  HPI: Leslie Parker is a 89 y.o. female with medical history significant of sCHF with EF 25-30%, SSS (s/p of PPM), A fib on Eliquis , HTN, HLD, DM, COPD, CKD-IV, osteopenia, ileus, who presents with SOB.  Pt has had multiple admissions recently due to CHF exacerbation.  Patient was suggested discharged home on 4/17 after treated for CHF exacerbation.  According to discharge summary, pt refused placement to SNF or ALF. Outpatient palliative care referral was placed.  She comes back due to worsening shortness or breath.  She has dry cough, chest tightness, no fever or chills.  She has bilateral leg edema and orthopnea.  No nausea, vomiting, diarrhea or abdominal pain.  No symptoms of UTI. Patient reportedly had wheezing, but on my examination, there is no wheezing on lung auscultation. Patient does not have oxygen desaturation at rest, but desaturated to 81-90% on ambulation per ED physician.   Data reviewed independently and ED Course: pt was found to have BNP> 4500, stable renal function, INR 1.4, negative PCR for COVID, flu and RSV, troponin 46 --> 53.  Chest x-ray showed atelectasis in bases and small bilateral pleural effusion.  Patient is placed in telemetry bed for patient.  EKG: I have personally reviewed.  Paced rhythm, QTc 503   Review of Systems:   General: no fevers, chills, no body weight gain, has fatigue HEENT: no blurry vision, hearing changes or sore throat Respiratory:  has dyspnea, coughing, no wheezing CV: Has chest tightness, no palpitations GI: no nausea, vomiting, abdominal pain, diarrhea, constipation GU: no dysuria, burning on urination, increased urinary frequency, hematuria  Ext: has leg edema Neuro: no unilateral weakness, numbness, or tingling,  no vision change or hearing loss Skin: no rash, no skin tear. MSK: No muscle spasm, no deformity, no limitation of range of movement in spin Heme: No easy bruising.  Travel history: No recent long distant travel.   Allergy:  Allergies  Allergen Reactions   Baclofen Other (See Comments)    Elevated Cr   Simvastatin Other (See Comments)    Pt denies  Elevated LFTs with simva 40mg , stopped and resolved (see MD note 11/10/13)    Past Medical History:  Diagnosis Date   Acute kidney injury superimposed on CKD (HCC) 11/24/2022   Asthma    Atrial fibrillation with RVR (HCC)    Cancer (HCC)    Basal Cell   CHF (congestive heart failure) (HCC)    Closed left hip fracture (HCC) 01/25/2017   Closed right hip fracture (HCC) 02/13/2023   Diabetes mellitus without complication (HCC)    Femur fracture, left (HCC) 02/03/2015   Heart murmur    Hypertension    Impetigo    Osteoarthritis    Osteopenia    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   Rapid atrial fibrillation, new onset(HCC) 06/05/2022   Vomiting    can not due to surgery   Wears dentures    full upper and lower    Past Surgical History:  Procedure Laterality Date   ABDOMINAL HYSTERECTOMY     BACK SURGERY     BLADDER SURGERY     mesh   CARPAL TUNNEL RELEASE Bilateral    CATARACT EXTRACTION Right 2017   CATARACT EXTRACTION W/PHACO Left 02/06/2016   Procedure:  CATARACT EXTRACTION PHACO AND INTRAOCULAR LENS PLACEMENT (IOC) left eye;  Surgeon: Billee Buddle, MD;  Location: [facility];  Service: Ophthalmology;  Laterality: Left;  DIABETIC LEFT Cannot arrive before 9:30   CERVICAL DISC SURGERY     CHOLECYSTECTOMY     EYE SURGERY Bilateral    Cataract Extraction with IOL   FEMUR IM NAIL Left 02/04/2015   Procedure: INTRAMEDULLARY (IM) NAIL FEMORAL;  Surgeon: Rande Bushy, MD;  Location: [facility];  Service: Orthopedics;  Laterality: Left;   HARDWARE REMOVAL Left 01/17/2017   Procedure: HARDWARE REMOVAL;   Surgeon: Rande Bushy, MD;  Location: [facility];  Service: Orthopedics;  Laterality: Left;   HERNIA REPAIR  2014   esophageal and gastric mesh. patient unable to throw up d/t mesh   HIP ARTHROPLASTY Left 01/26/2017   Procedure: ARTHROPLASTY BIPOLAR HIP (HEMIARTHROPLASTY) removal hardware left hip;  Surgeon: Marlynn Singer, MD;  Location: [facility];  Service: Orthopedics;  Laterality: Left;   INTRAMEDULLARY (IM) NAIL INTERTROCHANTERIC Right 02/13/2023   Procedure: INTRAMEDULLARY (IM) NAIL INTERTROCHANTERIC;  Surgeon: Elner Hahn, MD;  Location: [facility];  Service: Orthopedics;  Laterality: Right;   JOINT REPLACEMENT Bilateral    knees   PACEMAKER IMPLANT N/A 11/15/2022   Procedure: PACEMAKER IMPLANT;  Surgeon: Percival Brace, MD;  Location: [facility];  Service: Cardiovascular;  Laterality: N/A;   PACEMAKER IMPLANT N/A 11/28/2022   Procedure: PACEMAKER IMPLANT;  Surgeon: Percival Brace, MD;  Location: [facility];  Service: Cardiovascular;  Laterality: N/A;  Lead reposition   REPLACEMENT TOTAL KNEE BILATERAL Bilateral [id number]   SHOULDER ARTHROSCOPY WITH OPEN ROTATOR CUFF REPAIR AND DISTAL CLAVICLE ACROMINECTOMY Left 10/25/2016   Procedure: SHOULDER ARTHROSCOPY WITH OPEN ROTATOR CUFF REPAIR AND DISTAL CLAVICLE ACROMINECTOMY;  Surgeon: Rande Bushy, MD;  Location: [facility];  Service: Orthopedics;  Laterality: Left;   THYROID  SURGERY     goiter removed   TOTAL HIP REVISION Left 12/02/2017   Procedure: TOTAL HIP REVISION;  Surgeon: Jerlyn Moons, MD;  Location: [facility];  Service: Orthopedics;  Laterality: Left;   TOTAL SHOULDER REPLACEMENT Right 2012    Social History:  reports that she has never smoked. She has never used smokeless tobacco. She reports that she does not drink alcohol and does not use drugs.  Family History:  Family History  Problem Relation Age of Onset   Heart failure Mother    Hypertension Mother    Emphysema Father      Prior to  Admission medications   Medication Sig Start Date End Date Taking? Authorizing Provider  acetaminophen  (TYLENOL ) 325 MG tablet Take 2 tablets (650 mg total) by mouth every 4 (four) hours as needed for headache or mild pain (pain score 1-3). 09/19/23   Sheril Dines, MD  albuterol  (VENTOLIN  HFA) 108 (90 Base) MCG/ACT inhaler Inhale 2 puffs into the lungs every 6 (six) hours as needed. 08/04/19   [provider]  amiodarone  (PACERONE ) 200 MG tablet Take 1 tablet by mouth daily. 08/26/23   [provider]  apixaban  (ELIQUIS ) 2.5 MG TABS tablet Take 1 tablet (2.5 mg total) by mouth 2 (two) times daily. 06/07/22   Tiajuana Fluke, MD  ascorbic acid  (VITAMIN C ) 250 MG CHEW Chew 250 mg by mouth daily.    [provider]  carvedilol  (COREG ) 6.25 MG tablet Take 1 tablet (6.25 mg total) by mouth 2 (two) times daily with a meal. 08/11/23 11/09/23  Unk Garb, DO  Cholecalciferol  (VITAMIN D -1000 MAX ST) 25  MCG (1000 UT) tablet Take 1,000 Units by mouth daily.    [provider]  Cyanocobalamin  (B-12) 2500 MCG TABS Take 2,500 mcg by mouth daily.    [provider]  glipiZIDE  (GLUCOTROL  XL) 2.5 MG 24 hr tablet Take 2.5 mg by mouth daily.    [provider]  hydrALAZINE  (APRESOLINE ) 50 MG tablet Take 1 tablet (50 mg total) by mouth 3 (three) times daily. Patient taking differently: Take 50 mg by mouth 3 (three) times daily as needed. 08/11/23 11/09/23  Unk Garb, DO  ipratropium-albuterol  (DUONEB) 0.5-2.5 (3) MG/3ML SOLN Inhale 3 mLs into the lungs every 6 (six) hours as needed. 06/01/22   [provider]  isosorbide  mononitrate (IMDUR ) 30 MG 24 hr tablet Take 1 tablet (30 mg total) by mouth at bedtime. 08/11/23 11/09/23  Unk Garb, DO  Magnesium  Oxide 500 MG CAPS Take 500 mg by mouth daily.    [provider]  Multiple Vitamin (MULTIVITAMIN WITH MINERALS) TABS tablet Take 1 tablet by mouth daily. One-A-Day Women's Vitamin    [provider]  torsemide  (DEMADEX ) 20 MG tablet Take 1 tablet (20 mg total) by mouth daily. 11/07/23 12/07/23  Tiajuana Fluke, MD  traZODone  (DESYREL ) 100 MG tablet Take 100 mg by mouth at bedtime.    [provider]  TRELEGY ELLIPTA 100-62.5-25 MCG/ACT AEPB Inhale 1 puff into the lungs daily. 08/03/22   [provider]    Physical Exam: Vitals:   11/09/23 1800 11/09/23 1801 11/09/23 1802 11/09/23 1815  BP:      Pulse: 67 66 67 61  Resp: (!) 27 (!) 30 20 (!) 22  Temp:      TempSrc:      SpO2: 100% 96% 98% 98%   General: Not in acute distress HEENT:       Eyes: PERRL, EOMI, no jaundice       ENT: No discharge from the ears and nose, no pharynx injection, no tonsillar enlargement.        Neck: positive JVD, no bruit, no mass felt. Heme: No neck lymph node enlargement. Cardiac: S1/S2, RRR, No murmurs, No gallops or rubs. Respiratory: has fine crackles at bilateral bases GI: Soft, nondistended, nontender, no rebound pain, no organomegaly, BS present. GU: No hematuria Ext: 1+ pitting leg edema bilaterally. 1+DP/PT pulse bilaterally. Musculoskeletal: No joint deformities, No joint redness or warmth, no limitation of ROM in spin. Skin: No rashes.  Neuro: Alert, oriented X3, cranial nerves II-XII grossly intact, moves all extremities normally.  Psych: Patient is not psychotic, no suicidal or hemocidal ideation.  Labs on Admission: I have personally reviewed following labs and imaging studies  CBC: Recent Labs  Lab 11/05/23 0927 11/06/23 0414 11/09/23 1539  WBC 6.3 5.3 5.9  NEUTROABS 4.2  --   --   HGB 11.6* 10.3* 10.9*  HCT 35.4* 30.3* 32.9*  MCV 93.9 92.4 93.5  PLT 338 268 285   Basic Metabolic Panel: Recent Labs  Lab 11/05/23 0927 11/06/23 0414 11/07/23 0839 11/09/23 1539  NA 137 138 138 138  K 3.6 3.7 4.2 3.6  CL 102 105 103 101  CO2 24 24 25 25   GLUCOSE 150* 189* 138* 111*  BUN 52* 57* 71* 65*  CREATININE 2.29* 2.20* 2.56* 2.24*  CALCIUM 8.5* 8.2*  8.3* 8.3*  MG 2.3  --  2.1  --    GFR: Estimated Creatinine Clearance: 18.1 mL/min (A) (by C-G formula based on SCr of 2.24 mg/dL (H)). Liver Function Tests: Recent  Labs  Lab 11/05/23 0927  AST 37  ALT 30  ALKPHOS 102  BILITOT 1.5*  PROT 6.2*  ALBUMIN  3.1*   No results for input(s): "LIPASE", "AMYLASE" in the last 168 hours. No results for input(s): "AMMONIA" in the last 168 hours. Coagulation Profile: Recent Labs  Lab 11/05/23 0927 11/09/23 1539  INR 1.3* 1.4*   Cardiac Enzymes: No results for input(s): "CKTOTAL", "CKMB", "CKMBINDEX", "TROPONINI" in the last 168 hours. BNP (last 3 results) No results for input(s): "PROBNP" in the last 8760 hours. HbA1C: No results for input(s): "HGBA1C" in the last 72 hours. CBG: Recent Labs  Lab 11/07/23 0758 11/07/23 0829 11/07/23 1139 11/07/23 1616 11/07/23 1629  GLUCAP 146* 137* 135* 114* 101*   Lipid Profile: No results for input(s): "CHOL", "HDL", "LDLCALC", "TRIG", "CHOLHDL", "LDLDIRECT" in the last 72 hours. Thyroid  Function Tests: No results for input(s): "TSH", "T4TOTAL", "FREET4", "T3FREE", "THYROIDAB" in the last 72 hours. Anemia Panel: No results for input(s): "VITAMINB12", "FOLATE", "FERRITIN", "TIBC", "IRON", "RETICCTPCT" in the last 72 hours. Urine analysis:    Component Value Date/Time   COLORURINE YELLOW (A) 11/05/2023 1505   APPEARANCEUR CLEAR (A) 11/05/2023 1505   APPEARANCEUR Clear 02/19/2013 0254   LABSPEC 1.012 11/05/2023 1505   LABSPEC 1.006 02/19/2013 0254   PHURINE 6.0 11/05/2023 1505   GLUCOSEU NEGATIVE 11/05/2023 1505   GLUCOSEU Negative 02/19/2013 0254   HGBUR NEGATIVE 11/05/2023 1505   BILIRUBINUR NEGATIVE 11/05/2023 1505   BILIRUBINUR Negative 02/19/2013 0254   KETONESUR NEGATIVE 11/05/2023 1505   PROTEINUR 100 (A) 11/05/2023 1505   NITRITE NEGATIVE 11/05/2023 1505   LEUKOCYTESUR NEGATIVE 11/05/2023 1505   LEUKOCYTESUR 1+ 02/19/2013 0254   Sepsis  Labs: @LABRCNTIP (procalcitonin:4,lacticidven:4) ) Recent Results (from the past 240 hours)  Blood Culture (routine x 2)     Status: None (Preliminary result)   Collection Time: 11/05/23  9:17 AM   Specimen: BLOOD RIGHT FOREARM  Result Value Ref Range Status   Specimen Description BLOOD RIGHT FOREARM  Final   Special Requests   Final    BOTTLES DRAWN AEROBIC AND ANAEROBIC Blood Culture adequate volume   Culture   Final    NO GROWTH 4 DAYS Performed at [facility], [address] [place], [place] [address]    Report Status PENDING  Incomplete  Blood Culture (routine x 2)     Status: None (Preliminary result)   Collection Time: 11/05/23  9:27 AM   Specimen: BLOOD  Result Value Ref Range Status   Specimen Description BLOOD RIGHT ANTECUBITAL  Final   Special Requests   Final    BOTTLES DRAWN AEROBIC AND ANAEROBIC Blood Culture adequate volume   Culture   Final    NO GROWTH 4 DAYS Performed at [facility], [address], [place], [place] [address]    Report Status PENDING  Incomplete  Resp panel by RT-PCR (RSV, Flu A&B, Covid) Anterior Nasal Swab     Status: None   Collection Time: 11/05/23  9:27 AM   Specimen: Anterior Nasal Swab  Result Value Ref Range Status   SARS Coronavirus 2 by RT PCR NEGATIVE NEGATIVE Final    Comment: (NOTE) SARS-CoV-2 target nucleic acids are NOT DETECTED.  The SARS-CoV-2 RNA is generally detectable in upper respiratory specimens during the acute phase of infection. The lowest concentration of SARS-CoV-2 viral copies this assay can detect is 138 copies/mL. A negative result does not preclude SARS-Cov-2 infection and should not be used as the sole basis for treatment or other patient  management decisions. A negative result may occur with  improper specimen collection/handling, submission of specimen other than nasopharyngeal swab, presence of viral mutation(s) within the areas targeted by this assay, and inadequate number of  viral copies(<138 copies/mL). A negative result must be combined with clinical observations, patient history, and epidemiological information. The expected result is Negative.  Fact Sheet for Patients:  BloggerCourse.com  Fact Sheet for Healthcare Providers:  SeriousBroker.it  This test is no t yet approved or cleared by the [place]  FDA and  has been authorized for detection and/or diagnosis of SARS-CoV-2 by FDA under an Emergency Use Authorization (EUA). This EUA will remain  in effect (meaning this test can be used) for the duration of the COVID-19 declaration under Section 564(b)(1) of the Act, 21 U.S.C.section 360bbb-3(b)(1), unless the authorization is terminated  or revoked sooner.       Influenza A by PCR NEGATIVE NEGATIVE Final   Influenza B by PCR NEGATIVE NEGATIVE Final    Comment: (NOTE) The Xpert Xpress SARS-CoV-2/FLU/RSV plus assay is intended as an aid in the diagnosis of influenza from Nasopharyngeal swab specimens and should not be used as a sole basis for treatment. Nasal washings and aspirates are unacceptable for Xpert Xpress SARS-CoV-2/FLU/RSV testing.  Fact Sheet for Patients: BloggerCourse.com  Fact Sheet for Healthcare Providers: SeriousBroker.it  This test is not yet approved or cleared by the [place]  FDA and has been authorized for detection and/or diagnosis of SARS-CoV-2 by FDA under an Emergency Use Authorization (EUA). This EUA will remain in effect (meaning this test can be used) for the duration of the COVID-19 declaration under Section 564(b)(1) of the Act, 21 U.S.C. section 360bbb-3(b)(1), unless the authorization is terminated or revoked.     Resp Syncytial Virus by PCR NEGATIVE NEGATIVE Final    Comment: (NOTE) Fact Sheet for Patients: BloggerCourse.com  Fact Sheet for Healthcare  Providers: SeriousBroker.it  This test is not yet approved or cleared by the [place]  FDA and has been authorized for detection and/or diagnosis of SARS-CoV-2 by FDA under an Emergency Use Authorization (EUA). This EUA will remain in effect (meaning this test can be used) for the duration of the COVID-19 declaration under Section 564(b)(1) of the Act, 21 U.S.C. section 360bbb-3(b)(1), unless the authorization is terminated or revoked.  Performed at [facility], [address] [place], [place] [address]   Resp panel by RT-PCR (RSV, Flu A&B, Covid) Anterior Nasal Swab     Status: None   Collection Time: 11/09/23  3:39 PM   Specimen: Anterior Nasal Swab  Result Value Ref Range Status   SARS Coronavirus 2 by RT PCR NEGATIVE NEGATIVE Final    Comment: (NOTE) SARS-CoV-2 target nucleic acids are NOT DETECTED.  The SARS-CoV-2 RNA is generally detectable in upper respiratory specimens during the acute phase of infection. The lowest concentration of SARS-CoV-2 viral copies this assay can detect is 138 copies/mL. A negative result does not preclude SARS-Cov-2 infection and should not be used as the sole basis for treatment or other patient management decisions. A negative result may occur with  improper specimen collection/handling, submission of specimen other than nasopharyngeal swab, presence of viral mutation(s) within the areas targeted by this assay, and inadequate number of viral copies(<138 copies/mL). A negative result must be combined with clinical observations, patient history, and epidemiological information. The expected result is Negative.  Fact Sheet for Patients:  BloggerCourse.com  Fact Sheet for Healthcare Providers:  SeriousBroker.it  This test is no t yet approved or  cleared by the [place]  FDA and  has been authorized for detection and/or diagnosis of SARS-CoV-2 by FDA  under an Emergency Use Authorization (EUA). This EUA will remain  in effect (meaning this test can be used) for the duration of the COVID-19 declaration under Section 564(b)(1) of the Act, 21 U.S.C.section 360bbb-3(b)(1), unless the authorization is terminated  or revoked sooner.       Influenza A by PCR NEGATIVE NEGATIVE Final   Influenza B by PCR NEGATIVE NEGATIVE Final    Comment: (NOTE) The Xpert Xpress SARS-CoV-2/FLU/RSV plus assay is intended as an aid in the diagnosis of influenza from Nasopharyngeal swab specimens and should not be used as a sole basis for treatment. Nasal washings and aspirates are unacceptable for Xpert Xpress SARS-CoV-2/FLU/RSV testing.  Fact Sheet for Patients: BloggerCourse.com  Fact Sheet for Healthcare Providers: SeriousBroker.it  This test is not yet approved or cleared by the [place]  FDA and has been authorized for detection and/or diagnosis of SARS-CoV-2 by FDA under an Emergency Use Authorization (EUA). This EUA will remain in effect (meaning this test can be used) for the duration of the COVID-19 declaration under Section 564(b)(1) of the Act, 21 U.S.C. section 360bbb-3(b)(1), unless the authorization is terminated or revoked.     Resp Syncytial Virus by PCR NEGATIVE NEGATIVE Final    Comment: (NOTE) Fact Sheet for Patients: BloggerCourse.com  Fact Sheet for Healthcare Providers: SeriousBroker.it  This test is not yet approved or cleared by the [place]  FDA and has been authorized for detection and/or diagnosis of SARS-CoV-2 by FDA under an Emergency Use Authorization (EUA). This EUA will remain in effect (meaning this test can be used) for the duration of the COVID-19 declaration under Section 564(b)(1) of the Act, 21 U.S.C. section 360bbb-3(b)(1), unless the authorization is terminated or revoked.  Performed at [facility], [address] [place], [place] [address]      Radiological Exams on Admission:   Assessment/Plan Principal Problem:   Acute on chronic systolic CHF (congestive heart failure) (HCC) Active Problems:   Myocardial injury   COPD (chronic obstructive pulmonary disease) (HCC)   HTN (hypertension), malignant   Chronic a-fib (HCC)   Essential hypertension   Paroxysmal atrial fibrillation (HCC)   Type II diabetes mellitus with renal manifestations (HCC)   CKD (chronic kidney disease) stage 4, GFR 15-29 ml/min (HCC)   Overweight (BMI 25.0-29.9)   Assessment and Plan:  Acute on chronic systolic CHF (congestive heart failure) [facility]): Patient has SOB, 1+ leg edema, significantly elevated BNP> 4500, clinically consistent with CHF exacerbation.  2D echo on 08/09/2023 showed EF 25-30%.   -place in tele bed for obs -will give total of 80 mg of lasix  now, then 40 mg bid -Daily weights -strict I/O's -Low salt diet -Fluid restriction -As needed bronchodilators for shortness of breath -consult TOC for HH need or rehab/SNF placement -PT/OT  Myocardial injury: Troponin 46 --> 53, patient has chest tightness, likely demand ischemia. - Patient is on Eliquis , not give aspirin  - On imdur  - Patient is allergic to statin - get one more trop  COPD (chronic obstructive pulmonary disease) (HCC): No wheezing -Bronchodilators and as needed Mucinex    HTN (hypertension) -IV hydralazine  as needed -Coreg , Imdur    Chronic a-fib (HCC): Heart rate 50-60s in ED -Eliquis  -Coreg , amodarone   CKD (chronic kidney disease) stage 4, GFR 15-29 ml/min [facility]): Renal function stable.  Baseline creatinine 2.56 on 11/07/2023.  Her creatinine is 2.24, BUN 65, GFR 21. -For now  with BMP   Type II diabetes mellitus with renal manifestations [facility]): Recent A1c 7.0.  Patient is taking glipizide  at home -Sliding scale insulin    Overweight (BMI 25.0-29.9): Body weight 77.82 kg, BMI 25.22 -Encourage losing  weight -Exercise and healthy diet        DVT ppx:on Eliquis   Code Status: Full code   Family Communication:     not done, no family member is at bed side.      Disposition Plan:  Anticipate discharge back to previous environment  Consults called:  none  Admission status and Level of care: Telemetry Cardiac:    for obs     Dispo: The patient is from: Home              Anticipated d/c is to: Home              Anticipated d/c date is: 1 day              Patient currently is not medically stable to d/c.    Severity of Illness:  The appropriate patient status for this patient is OBSERVATION. Observation status is judged to be reasonable and necessary in order to provide the required intensity of service to ensure the patient's safety. The patient's presenting symptoms, physical exam findings, and initial radiographic and laboratory data in the context of their medical condition is felt to place them at decreased risk for further clinical deterioration. Furthermore, it is anticipated that the patient will be medically stable for discharge from the hospital within 2 midnights of admission.        Date of Service 11/09/2023    Fidencio Hue [facility]   If 7PM-7AM, please contact night-coverage [email or web address] 11/09/2023, 6:54 PM

## 2023-11-09 NOTE — ED Notes (Signed)
 Walking SPO2 was mostly 94-97%, did go as low as 81% and momentarily 90% on RA.

## 2023-11-09 NOTE — ED Notes (Signed)
 Pt is eating dinner. Dr Rosalea Collin was at bedside. Pt has no complaints. Less SOB.

## 2023-11-09 NOTE — ED Notes (Signed)
 Pt sleeping.

## 2023-11-09 NOTE — ED Triage Notes (Addendum)
 Pt to ED via [facility] from home. Pt reports SOB that has been ongoing since 4/15. Pt also reports difficulty sleeping due to SOB. Pt took 4 neb tx prior to EMS arrival. EMS gave 2.5mg  albuterol  at 1430. Pt is on blood thinner.   EMS VS: HR 70 95% RA 176/88 Bilateral wheeze resolved after neb  CO2 33 RR 20

## 2023-11-09 NOTE — ED Notes (Signed)
 Pt walked well earlier and did not desaturate but has been SOB since getting back in bed and also because helped push self up in bed to reposition. EDP made aware and came to bedside. Pt is tachypneic.

## 2023-11-09 NOTE — ED Provider Notes (Signed)
 [facility] Provider Note    Event Date/Time   First MD Initiated Contact with Patient 11/09/23 1529     (approximate)   History   Shortness of Breath   HPI  Leslie Parker is a 89 y.o. female with history of COPD, atrial fibrillation on Eliquis , history of hypertension, heart failure, presenting with 2 days of shortness of breath and cough.  Patient states that she has had nonproductive cough which is new.  No chest pain.  States that she does not know if her legs are swollen.  Lives at home alone but states that she has friends and family around who can help her out at home.  No fever, no nausea, vomiting, diarrhea, abdominal pain, back pain.  No falls.  Per EMS she had reported shortness of breath since 15 April, reported difficulty sleeping due to the shortness of breath, had taken 4 neb treatments prior to EMS arrival, they gave her 1 albuterol .  States that bilateral wheezing had resolved after nebulizer treatment, was satting 95% on room air.  Independent history obtained from EMS as above.  On independent chart review, she was discharged on 17 April for CHF exacerbation, EF was 25 to 30%, it was discussed that her prognosis is poor, she is on home dose of 20 mg daily.  Has had 7 admissions since January for very similar issues.  Per discharge note, she has had frequent hospital admissions and struggles to care for herself.  Patient states that she is in the process of getting home health set up but has people around her who can help her out.     Physical Exam   Triage Vital Signs: ED Triage Vitals  Encounter Vitals Group     BP 11/09/23 1505 (!) 161/77     Systolic BP Percentile --      Diastolic BP Percentile --      Pulse Rate 11/09/23 1505 73     Resp 11/09/23 1505 20     Temp 11/09/23 1505 97.9 F (36.6 C)     Temp Source 11/09/23 1505 Oral     SpO2 11/09/23 1505 97 %     Weight --      Height --      Head Circumference --      Peak Flow  --      Pain Score 11/09/23 1506 0     Pain Loc --      Pain Education --      Exclude from Growth Chart --     Most recent vital signs: Vitals:   11/09/23 1801 11/09/23 1802  BP:    Pulse: 66 67  Resp: (!) 30 20  Temp:    SpO2: 96% 98%     General: Awake, no distress.  CV:  Good peripheral perfusion.  Resp:  Normal effort.  Rhonchorous breath sounds in the bases bilaterally Abd:  No distention.  Soft nontender Other:  Bilateral lower extremity edema the appear symmetrical   ED Results / Procedures / Treatments   Labs (all labs ordered are listed, but only abnormal results are displayed) Labs Reviewed  BASIC METABOLIC PANEL WITH GFR - Abnormal; Notable for the following components:      Result Value   Glucose, Bld 111 (*)    BUN 65 (*)    Creatinine, Ser 2.24 (*)    Calcium 8.3 (*)    GFR, Estimated 21 (*)    All other components within normal  limits  CBC - Abnormal; Notable for the following components:   RBC 3.52 (*)    Hemoglobin 10.9 (*)    HCT 32.9 (*)    All other components within normal limits  BRAIN NATRIURETIC PEPTIDE - Abnormal; Notable for the following components:   B Natriuretic Peptide >4,500.0 (*)    All other components within normal limits  PROTIME-INR - Abnormal; Notable for the following components:   Prothrombin Time 17.7 (*)    INR 1.4 (*)    All other components within normal limits  TROPONIN I (HIGH SENSITIVITY) - Abnormal; Notable for the following components:   Troponin I (High Sensitivity) 46 (*)    All other components within normal limits  TROPONIN I (HIGH SENSITIVITY) - Abnormal; Notable for the following components:   Troponin I (High Sensitivity) 53 (*)    All other components within normal limits  RESP PANEL BY RT-PCR (RSV, FLU A&B, COVID)  [id number]     EKG  Paced rhythm, rate of 69, but in QRS, normal QTc, no ischemic ST elevation, T wave flattening in 1, aVL, not significantly change compared to prior   RADIOLOGY X-ray  on my independent interpretation shows bilateral pleural effusions.   PROCEDURES:  Critical Care performed: No  Procedures   MEDICATIONS ORDERED IN ED: Medications  albuterol  (VENTOLIN  HFA) 108 (90 Base) MCG/ACT inhaler 2 puff (has no administration in time range)  dextromethorphan -guaiFENesin  (MUCINEX  DM) 30-600 MG per 12 hr tablet 1 tablet (has no administration in time range)  ondansetron  (ZOFRAN ) injection 4 mg (has no administration in time range)  hydrALAZINE  (APRESOLINE ) injection 5 mg (has no administration in time range)  acetaminophen  (TYLENOL ) tablet 650 mg (has no administration in time range)  insulin  aspart (novoLOG ) injection 0-9 Units (has no administration in time range)  insulin  aspart (novoLOG ) injection 0-5 Units (has no administration in time range)  furosemide  (LASIX ) injection 40 mg (40 mg Intravenous Given 11/09/23 1640)     IMPRESSION / MDM / ASSESSMENT AND PLAN / ED COURSE  I reviewed the triage vital signs and the nursing notes.                              Differential diagnosis includes, but is not limited to, CHF exacerbation, COPD exacerbation, pneumonia, viral illness, atypical ACS, electrolyte derangements.  Will get labs, EKG, troponin, chest x-ray, BNP.  Patient's presentation is most consistent with acute presentation with potential threat to life or bodily function.  Independent review of labs and imaging below.  Clinical course is also below.  Given that she is tachypneic, short of breath, lives at home alone, she will need to come in for further management.  Since she is high risk.  Consulted hospitalist who is agreeable with the plan for admission and will evaluate the patient.  She is admitted.  Clinical Course as of 11/09/23 1819  Sat Nov 09, 2023  1633 Independent review of labs, H&H is stable, no leukocytosis, creatinine is elevated but this is downtrending compared to prior, electrolytes not severely deranged, her INR is mildly elevated  but she is on Eliquis , troponin is mildly elevated but she has no chest pain at this time, BNP is elevated and has been elevated in the past. [TT]  1730 Patient is ambulatory sat will go down to 90% on room air. [TT]  1800 On reassessment patient is quite tachypneic, still short of breath after the IV Lasix , will plan to  have her admitted for further management and CHF exacerbation. [TT]  1819 DG Chest 2 View IMPRESSION: 1. Atelectasis or infiltrate at the lung bases. 2. Small bilateral pleural effusions. 3. Aortic atherosclerosis.   [TT]    Clinical Course User Index [TT] Drenda Gentle, Richard Champion, MD     FINAL CLINICAL IMPRESSION(S) / ED DIAGNOSES   Final diagnoses:  Dyspnea, unspecified type  Acute on chronic congestive heart failure, unspecified heart failure type (HCC)     Rx / DC Orders   ED Discharge Orders     None        Note:  This document was prepared using Dragon voice recognition software and may include unintentional dictation errors.    Shane Darling, MD 11/09/23 [phone]

## 2023-11-10 ENCOUNTER — Other Ambulatory Visit: Payer: Self-pay

## 2023-11-10 DIAGNOSIS — Z96653 Presence of artificial knee joint, bilateral: Secondary | ICD-10-CM | POA: Diagnosis present

## 2023-11-10 DIAGNOSIS — I130 Hypertensive heart and chronic kidney disease with heart failure and stage 1 through stage 4 chronic kidney disease, or unspecified chronic kidney disease: Secondary | ICD-10-CM | POA: Diagnosis present

## 2023-11-10 DIAGNOSIS — Z888 Allergy status to other drugs, medicaments and biological substances status: Secondary | ICD-10-CM | POA: Diagnosis not present

## 2023-11-10 DIAGNOSIS — Z79899 Other long term (current) drug therapy: Secondary | ICD-10-CM | POA: Diagnosis not present

## 2023-11-10 DIAGNOSIS — Z9842 Cataract extraction status, left eye: Secondary | ICD-10-CM | POA: Diagnosis not present

## 2023-11-10 DIAGNOSIS — I509 Heart failure, unspecified: Secondary | ICD-10-CM

## 2023-11-10 DIAGNOSIS — Z66 Do not resuscitate: Secondary | ICD-10-CM

## 2023-11-10 DIAGNOSIS — Z961 Presence of intraocular lens: Secondary | ICD-10-CM | POA: Diagnosis present

## 2023-11-10 DIAGNOSIS — Z1152 Encounter for screening for COVID-19: Secondary | ICD-10-CM | POA: Diagnosis not present

## 2023-11-10 DIAGNOSIS — I480 Paroxysmal atrial fibrillation: Secondary | ICD-10-CM | POA: Diagnosis present

## 2023-11-10 DIAGNOSIS — I482 Chronic atrial fibrillation, unspecified: Secondary | ICD-10-CM | POA: Diagnosis present

## 2023-11-10 DIAGNOSIS — Z7984 Long term (current) use of oral hypoglycemic drugs: Secondary | ICD-10-CM | POA: Diagnosis not present

## 2023-11-10 DIAGNOSIS — J9811 Atelectasis: Secondary | ICD-10-CM | POA: Diagnosis present

## 2023-11-10 DIAGNOSIS — I5023 Acute on chronic systolic (congestive) heart failure: Secondary | ICD-10-CM | POA: Diagnosis present

## 2023-11-10 DIAGNOSIS — Z9841 Cataract extraction status, right eye: Secondary | ICD-10-CM | POA: Diagnosis not present

## 2023-11-10 DIAGNOSIS — E785 Hyperlipidemia, unspecified: Secondary | ICD-10-CM | POA: Diagnosis present

## 2023-11-10 DIAGNOSIS — E1122 Type 2 diabetes mellitus with diabetic chronic kidney disease: Secondary | ICD-10-CM | POA: Diagnosis present

## 2023-11-10 DIAGNOSIS — I495 Sick sinus syndrome: Secondary | ICD-10-CM | POA: Diagnosis present

## 2023-11-10 DIAGNOSIS — Z7901 Long term (current) use of anticoagulants: Secondary | ICD-10-CM | POA: Diagnosis not present

## 2023-11-10 DIAGNOSIS — J449 Chronic obstructive pulmonary disease, unspecified: Secondary | ICD-10-CM | POA: Diagnosis present

## 2023-11-10 DIAGNOSIS — Z6841 Body Mass Index (BMI) 40.0 and over, adult: Secondary | ICD-10-CM | POA: Diagnosis not present

## 2023-11-10 DIAGNOSIS — N184 Chronic kidney disease, stage 4 (severe): Secondary | ICD-10-CM | POA: Diagnosis present

## 2023-11-10 DIAGNOSIS — Z8249 Family history of ischemic heart disease and other diseases of the circulatory system: Secondary | ICD-10-CM | POA: Diagnosis not present

## 2023-11-10 DIAGNOSIS — I2489 Other forms of acute ischemic heart disease: Secondary | ICD-10-CM | POA: Diagnosis present

## 2023-11-10 DIAGNOSIS — J918 Pleural effusion in other conditions classified elsewhere: Secondary | ICD-10-CM | POA: Diagnosis present

## 2023-11-10 LAB — CULTURE, BLOOD (ROUTINE X 2)
Culture: NO GROWTH
Culture: NO GROWTH
Special Requests: ADEQUATE
Special Requests: ADEQUATE

## 2023-11-10 LAB — BASIC METABOLIC PANEL WITH GFR
Anion gap: 7 (ref 5–15)
BUN: 60 mg/dL — ABNORMAL HIGH (ref 8–23)
CO2: 25 mmol/L (ref 22–32)
Calcium: 7.9 mg/dL — ABNORMAL LOW (ref 8.9–10.3)
Chloride: 104 mmol/L (ref 98–111)
Creatinine, Ser: 2.18 mg/dL — ABNORMAL HIGH (ref 0.44–1.00)
GFR, Estimated: 21 mL/min — ABNORMAL LOW (ref >60)
Glucose, Bld: 86 mg/dL (ref 70–99)
Potassium: 3.3 mmol/L — ABNORMAL LOW (ref 3.5–5.1)
Sodium: 136 mmol/L (ref 135–145)

## 2023-11-10 LAB — CBC
HCT: 29.5 % — ABNORMAL LOW (ref 36.0–46.0)
Hemoglobin: 9.7 g/dL — ABNORMAL LOW (ref 12.0–15.0)
MCH: 31.4 pg (ref 26.0–34.0)
MCHC: 32.9 g/dL (ref 30.0–36.0)
MCV: 95.5 fL (ref 80.0–100.0)
Platelets: 243 10*3/uL (ref 150–400)
RBC: 3.09 MIL/uL — ABNORMAL LOW (ref 3.87–5.11)
RDW: 15.5 % (ref 11.5–15.5)
WBC: 5 10*3/uL (ref 4.0–10.5)
nRBC: 0 % (ref 0.0–0.2)

## 2023-11-10 LAB — CBG MONITORING, ED
Glucose-Capillary: 81 mg/dL (ref 70–99)
Glucose-Capillary: 123 mg/dL — ABNORMAL HIGH (ref 70–99)

## 2023-11-10 LAB — GLUCOSE, CAPILLARY
Glucose-Capillary: 107 mg/dL — ABNORMAL HIGH (ref 70–99)
Glucose-Capillary: 118 mg/dL — ABNORMAL HIGH (ref 70–99)

## 2023-11-10 LAB — MAGNESIUM: Magnesium: 1.6 mg/dL — ABNORMAL LOW (ref 1.7–2.4)

## 2023-11-10 MED ORDER — CARVEDILOL 12.5 MG PO TABS: 12.5000 mg | ORAL_TABLET | Freq: Two times a day (BID) | ORAL | Status: DC

## 2023-11-10 MED ORDER — CARVEDILOL 6.25 MG PO TABS
6.2500 mg | ORAL_TABLET | Freq: Two times a day (BID) | ORAL | Status: DC
  Administered 2023-11-10 – 2023-11-12 (×5): 6.25 mg via ORAL
  Filled 2023-11-10 (×5): qty 1

## 2023-11-10 NOTE — Evaluation (Signed)
 Physical Therapy Evaluation Patient Details Name: [place] Parker MRN: [id number] DOB: 24-Mar-1933 Today's Date: 11/10/2023  History of Present Illness  Leslie Parker is a 89 y.o. female with medical history significant of sCHF with EF 25-30%, SSS (s/p of PPM), A fib on Eliquis , HTN, HLD, DM, COPD, CKD-IV, osteopenia, ileus, who presents with SOB. Imaging found:  Atelectasis or infiltrate at the lung bases.  Small bilateral pleural effusions.  Clinical Impression  Pt received on stretcher in ED agreeable to PT/OT evaluation. Pt is pleasant and lert And O x 4. Pt PLOF lives alone and uses RW. Pt received PT/OT and Home health Aide prior to this ER visit. HH aide 3 times a week to help with ADLs and mild cooking. Family does grocery shopping. Pt grand daughter is POA. Pt EF and SOB  limits pt functional mobility. Pt Needed CGA with all mobility without symptoms. Pt maintained BP 165/67 supine, sitting 135/97, standing 136/88 and asymptomatic. PT will continue in acute and pt will benefit from PT beyond Acute to return to PLOF safely.       If plan is discharge home, recommend the following: A little help with walking and/or transfers;A little help with bathing/dressing/bathroom;Assistance with cooking/housework;Direct supervision/assist for medications management;Direct supervision/assist for financial management;Help with stairs or ramp for entrance;Assist for transportation   Can travel by private vehicle        Equipment Recommendations None recommended by PT  Recommendations for Other Services       Functional Status Assessment Patient has had a recent decline in their functional status and demonstrates the ability to make significant improvements in function in a reasonable and predictable amount of time.     Precautions / Restrictions Precautions Precautions: Fall Recall of Precautions/Restrictions: Intact Restrictions Weight Bearing Restrictions Per Provider Order: No       Mobility  Bed Mobility Overal bed mobility: Needs Assistance Bed Mobility: Supine to Sit     Supine to sit: +2 for physical assistance, Contact guard          Transfers Overall transfer level: Needs assistance Equipment used: Rolling walker (2 wheels) Transfers: Sit to/from Stand, Bed to chair/wheelchair/BSC Sit to Stand: Contact guard assist   Step pivot transfers: Contact guard assist       General transfer comment: Good safety awareness during t/f. no LOB noted.    Ambulation/Gait               General Gait Details: NT 2/2 pt tired.  Stairs            Wheelchair Mobility     Tilt Bed    Modified Rankin (Stroke Patients Only)       Balance Overall balance assessment: Needs assistance Sitting-balance support: Feet supported, No upper extremity supported Sitting balance-[name]: Good Sitting balance - Comments: Good reach within BOS   Standing balance support: Bilateral upper extremity supported Standing balance-[name]: Good Standing balance comment: No LOB but faitgues easily.                             Pertinent Vitals/Pain Pain Assessment Pain Assessment: No/denies pain    Home Living Family/patient expects to be discharged to:: Private residence Living Arrangements: Alone Available Help at Discharge: Family;Available PRN/intermittently Type of Home: Apartment Home Access: Level entry       Home Layout: One level Home Equipment: Rollator (4 wheels);Cane - single point;Grab bars - tub/shower;Grab bars - toilet;Lift chair;Transport  chair;Tub bench;Rolling Walker (2 wheels);Shower seat Additional Comments: Senior living  apartment with granddaughter able to stay with patient as needed, has call bells in multiple rooms for help if needed.  Reports daughter (from [place]) has been in town and planning to return home soon.    Prior Function Prior Level of Function : Needs assist             Mobility Comments: Mod  Ind amb in the home with a rollator, no falls in the last 6 months, family assists pt in a transport chair for community access ADLs Comments: Granddaughter assists with IADLs including errands and obtaining groceries     Extremity/Trunk Assessment   Upper Extremity Assessment Upper Extremity Assessment: Defer to OT evaluation    Lower Extremity Assessment Lower Extremity Assessment: Generalized weakness       Communication   Communication Communication: No apparent difficulties Factors Affecting Communication: Hearing impaired    Cognition Arousal: Alert Behavior During Therapy: WFL for tasks assessed/performed                             Following commands: Intact       Cueing Cueing Techniques: Verbal cues     General Comments General comments (skin integrity, edema, etc.): No O2 use    Exercises     Assessment/Plan    PT Assessment Patient needs continued PT services  PT Problem List Decreased strength;Decreased activity tolerance;Cardiopulmonary status limiting activity;Decreased mobility       PT Treatment Interventions Gait training;Functional mobility training;Therapeutic activities;Therapeutic exercise;Patient/family education    PT Goals (Current goals can be found in the Care Plan section)  Acute Rehab PT Goals Patient Stated Goal: To return home PT Goal Formulation: With patient Time For Goal Achievement: 11/17/23 Potential to Achieve Goals: Good    Frequency Min 2X/week     Co-evaluation PT/OT/SLP Co-Evaluation/Treatment: Yes Reason for Co-Treatment: For patient/therapist safety;To address functional/ADL transfers;Complexity of the patient's impairments (multi-system involvement) PT goals addressed during session: Mobility/safety with mobility;Balance OT goals addressed during session: Proper use of Adaptive equipment and DME;ADL's and self-care       AM-PAC PT "6 Clicks" Mobility  Outcome Measure Help needed turning from  your back to your side while in a flat bed without using bedrails?: A Little Help needed moving from lying on your back to sitting on the side of a flat bed without using bedrails?: A Little Help needed moving to and from a bed to a chair (including a wheelchair)?: A Little Help needed standing up from a chair using your arms (e.g., wheelchair or bedside chair)?: A Little Help needed to walk in hospital room?: A Little Help needed climbing 3-5 steps with a railing? : A Lot 6 Click Score: 17    End of Session Equipment Utilized During Treatment: Gait belt Activity Tolerance: Patient tolerated treatment well Patient left: in chair;with call bell/phone within reach;with chair alarm set Nurse Communication: Mobility status PT Visit Diagnosis: Muscle weakness (generalized) (M62.81);Difficulty in walking, not elsewhere classified (R26.2)    Time: 8119-1478 PT Time Calculation (min) (ACUTE ONLY): 33 min   Charges:   PT Evaluation $PT Eval Low Complexity: 1 Low PT Treatments $Therapeutic Activity: 8-22 mins PT General Charges $$ ACUTE PT VISIT: 1 Visit         Ashley Lawrence PT DPT 1:34 PM,11/10/23

## 2023-11-10 NOTE — Plan of Care (Signed)
  Problem: Education: Goal: Ability to describe self-care measures that may prevent or decrease complications (Diabetes Survival Skills Education) will improve Outcome: Progressing Goal: Individualized Educational Video(s) Outcome: Progressing   Problem: Coping: Goal: Ability to adjust to condition or change in health will improve Outcome: Progressing   Problem: Fluid Volume: Goal: Ability to maintain a balanced intake and output will improve Outcome: Progressing   Problem: Health Behavior/Discharge Planning: Goal: Ability to identify and utilize available resources and services will improve Outcome: Progressing Goal: Ability to manage health-related needs will improve Outcome: Progressing   Problem: Metabolic: Goal: Ability to maintain appropriate glucose levels will improve Outcome: Progressing   Problem: Nutritional: Goal: Maintenance of adequate nutrition will improve Outcome: Progressing Goal: Progress toward achieving an optimal weight will improve Outcome: Progressing   Problem: Skin Integrity: Goal: Risk for impaired skin integrity will decrease Outcome: Progressing   Problem: Education: Goal: Knowledge of General Education information will improve Description: Including pain rating scale, medication(s)/side effects and non-pharmacologic comfort measures Outcome: Progressing   Problem: Tissue Perfusion: Goal: Adequacy of tissue perfusion will improve Outcome: Progressing   Problem: Health Behavior/Discharge Planning: Goal: Ability to manage health-related needs will improve Outcome: Progressing   Problem: Clinical Measurements: Goal: Ability to maintain clinical measurements within normal limits will improve Outcome: Progressing Goal: Will remain free from infection Outcome: Progressing Goal: Diagnostic test results will improve Outcome: Progressing Goal: Respiratory complications will improve Outcome: Progressing Goal: Cardiovascular complication will  be avoided Outcome: Progressing   Problem: Activity: Goal: Risk for activity intolerance will decrease Outcome: Progressing   Problem: Coping: Goal: Level of anxiety will decrease Outcome: Progressing   Problem: Elimination: Goal: Will not experience complications related to bowel motility Outcome: Progressing Goal: Will not experience complications related to urinary retention Outcome: Progressing   Problem: Pain Managment: Goal: General experience of comfort will improve and/or be controlled Outcome: Progressing   Problem: Safety: Goal: Ability to remain free from injury will improve Outcome: Progressing   Problem: Education: Goal: Ability to demonstrate management of disease process will improve Outcome: Progressing Goal: Ability to verbalize understanding of medication therapies will improve Outcome: Progressing Goal: Individualized Educational Video(s) Outcome: Progressing   Problem: Skin Integrity: Goal: Risk for impaired skin integrity will decrease Outcome: Progressing   Problem: Activity: Goal: Capacity to carry out activities will improve Outcome: Progressing   Problem: Education: Goal: Knowledge of disease or condition will improve Outcome: Progressing Goal: Knowledge of the prescribed therapeutic regimen will improve Outcome: Progressing Goal: Individualized Educational Video(s) Outcome: Progressing   Problem: Activity: Goal: Ability to tolerate increased activity will improve Outcome: Progressing Goal: Will verbalize the importance of balancing activity with adequate rest periods Outcome: Progressing   Problem: Respiratory: Goal: Ability to maintain a clear airway will improve Outcome: Progressing Goal: Levels of oxygenation will improve Outcome: Progressing Goal: Ability to maintain adequate ventilation will improve Outcome: Progressing

## 2023-11-10 NOTE — Plan of Care (Signed)

## 2023-11-10 NOTE — IPAL (Signed)
  Interdisciplinary Goals of Care Family Meeting   Date carried out: 11/10/2023  Location of the meeting: Bedside  Member's involved: Physician, patient, physical therapy  Durable Power of Attorney or acting medical decision maker: none needed, pt has decisional capacity at this time     Discussion: We discussed goals of care for [facility] [facility] .  Discussed her heart condition(s) and multiple hospitalizations indicating progression of disease, which is expected in end stage CHF. I discussed that given how weak her heart is, if she were to experience cardiopulmonary arrest, aggressive resuscitation efforts would not be likely to result in improvement/recovery. CPR and intubation being painful and not likely to benefit, I would suggest that if she decompensates to the point of arrest or imminent death, would want her to be comfortable rather than put her body through CPR/intubation. She says "that's ok that makes sense, I'm 89 and I'm okay with it." She would like me to talk to her granddaughter about this and I said I'm happy to reach out to her later today.   Code status:   Code Status: Limited: Do not attempt resuscitation (DNR) -DNR-LIMITED -Do Not Intubate/DNI    Disposition: Continue current acute care but would not administer CPR and would not intubate for respiratory arrest would only intubate for procedure   Time spent for the meeting: 15 min    Melodi Sprung, DO  11/10/2023, 10:27 AM

## 2023-11-10 NOTE — Progress Notes (Signed)
 PROGRESS NOTE    Leslie Parker   UJW:[id number] DOB: 1932-11-25  DOA: 11/09/2023 Date of Service: 11/10/23 which is hospital day 0  PCP: Rex Castor, MD    Hospital course / significant events:   HPI: Leslie Parker is a 89 y.o. female with medical history significant of sCHF with EF 25-30%, SSS (s/p of PPM), A fib on Eliquis , HTN, HLD, DM, COPD, CKD-IV, osteopenia, ileus, who presents with SOB, LE edema, orthopnea.   Of note, recent admission for similar, multiple admissions for end-stage CHF EF 25-30% - 7 admissions since January 2025 for very similar issues. Pt has pt refused placement to SNF or ALF.   04/19: admitted to hospitalist for CHF  04/20: still very SOB, reviewed CXR< pleural effusion definitely advanced from previous imaging, US  +/- thoracentesis ordered. D/w patient re: code status given severely reduced EF and multiple hospitalizations / progression - opt for DNR.      Consultants:  none  Procedures/Surgeries: Pending possible thoracentesis 11/10/23       ASSESSMENT & PLAN:   Acute on chronic systolic CHF (congestive heart failure) [facility]): Patient has SOB, 1+ leg edema, significantly elevated BNP> 4500, clinically consistent with CHF exacerbation.  2D echo on 08/09/2023 showed EF 25-30%.  80 mg of lasix  on admission, then 40 mg bid Daily weights strict I/O's Low salt diet Fluid restriction   Myocardial injury likely demand ischemia. on Eliquis , not give aspirin  On imdur  allergic to statin Recheck as needed   COPD (chronic obstructive pulmonary disease)  No wheezing Bronchodilators  as needed Mucinex    HTN (hypertension) IV hydralazine  as needed Coreg , Imdur    Chronic a-fib Heart rate 50-60s in ED Eliquis  Coreg , amodarone   CKD (chronic kidney disease) stage 4, GFR 15-29 ml/min  Renal function stable.  Baseline creatinine 2.56 on 11/07/2023.  Her creatinine is 2.24, BUN 65, GFR 21. For now with BMP   Type II diabetes mellitus with  renal manifestations Recent A1c 7.0.  Patient is taking glipizide  at home Sliding scale insulin       --- based on BMI: There is no height or weight on file to calculate BMI.  Underweight - under 18  overweight - 25 to 29 obese - 30 or more Class 1 obesity: BMI of 30.0 to 34 Class 2 obesity: BMI of 35.0 to 39 Class 3 obesity: BMI of 40.0 to 49 Super Morbid Obesity: BMI 50-59 Super-super Morbid Obesity: BMI 60+ Significantly low or high BMI is associated with higher medical risk.  Weight management advised as adjunct to other disease management and risk reduction treatments    DVT prophylaxis: Eliquis  IV fluids: no continuous IV fluids  Nutrition: cardiac diet Central lines / other devices: none  Code Status: DNR see IPAL note from 11/10/23  ACP documentation reviewed: advanced directive and GOC documentation are on file in [facility]  [facility] needs: [facility] Medical barriers to dispo: CHF, thoracentesis. Expected medical readiness for discharge possibly 1-2 days.              Subjective / Brief ROS:  Patient reports SOB this morning Denies CP Pain controlled.  Denies new weakness.  Tolerating diet.  Reports no concerns w/ urination/defecation.   Family Communication: pt asked me to call her granddaughter - I called 11/10/23 3:44 PM and left hipa compliant voicemail     Objective Findings:  Vitals:   11/10/23 1403 11/10/23 1426 11/10/23 1437 11/10/23 1520  BP: (!) 181/78 (!) 177/79  (!) 177/79  Pulse: 63 62  62  Resp: 20   20  Temp: 98.5 F (36.9 C) 97.6 F (36.4 C)  97.6 F (36.4 C)  TempSrc: Oral Oral  Oral  SpO2: 98% 100%    Weight:   77.8 kg 77.8 kg  Height:    5\' 9"  (1.753 m)    Intake/Output Summary (Last 24 hours) at 11/10/2023 1542 Last data filed at 11/10/2023 1354 Gross per 24 hour  Intake --  Output 1100 ml  Net -1100 ml   Filed Weights   11/10/23 1437 11/10/23 1520  Weight: 77.8 kg 77.8 kg    Examination:  Physical Exam Constitutional:       General: She is not in acute distress. Cardiovascular:     Rate and Rhythm: Normal rate and regular rhythm.     Heart sounds: Murmur heard.  Pulmonary:     Effort: No tachypnea or respiratory distress.     Breath sounds: Examination of the right-middle field reveals decreased breath sounds and rales. Examination of the left-middle field reveals decreased breath sounds and rales. Examination of the right-lower field reveals decreased breath sounds. Examination of the left-lower field reveals decreased breath sounds. Decreased breath sounds and rales present.  Musculoskeletal:     Right lower leg: Edema present.     Left lower leg: Edema present.  Skin:    General: Skin is warm and dry.  Neurological:     General: No focal deficit present.     Mental Status: She is alert and oriented to person, place, and time.  Psychiatric:        Mood and Affect: Mood normal.        Behavior: Behavior normal.          Scheduled Medications:   amiodarone   200 mg Oral Daily   apixaban   2.5 mg Oral BID   ascorbic acid   250 mg Oral Daily   budeson-glycopyrrolate -formoterol   2 puff Inhalation BID   carvedilol   6.25 mg Oral BID WC   cholecalciferol   1,000 Units Oral Daily   cyanocobalamin   2,500 mcg Oral Daily   furosemide   40 mg Intravenous Q12H   insulin  aspart  0-5 Units Subcutaneous QHS   insulin  aspart  0-9 Units Subcutaneous TID WC   isosorbide  mononitrate  30 mg Oral QHS   magnesium  oxide  400 mg Oral Daily   multivitamin with minerals  1 tablet Oral Daily   traZODone   100 mg Oral QHS    Continuous Infusions:   PRN Medications:  acetaminophen , albuterol , dextromethorphan -guaiFENesin , hydrALAZINE , ondansetron  (ZOFRAN ) IV  Antimicrobials from admission:  Anti-infectives (From admission, onward)    None           Data Reviewed:  I have personally reviewed the following...  CBC: Recent Labs  Lab 11/05/23 0927 11/06/23 0414 11/09/23 1539 11/10/23 0350  WBC 6.3  5.3 5.9 5.0  NEUTROABS 4.2  --   --   --   HGB 11.6* 10.3* 10.9* 9.7*  HCT 35.4* 30.3* 32.9* 29.5*  MCV 93.9 92.4 93.5 95.5  PLT 338 268 285 243   Basic Metabolic Panel: Recent Labs  Lab 11/05/23 0927 11/06/23 0414 11/07/23 0839 11/09/23 1539 11/10/23 0350  NA 137 138 138 138 136  K 3.6 3.7 4.2 3.6 3.3*  CL 102 105 103 101 104  CO2 24 24 25 25 25   GLUCOSE 150* 189* 138* 111* 86  BUN 52* 57* 71* 65* 60*  CREATININE 2.29* 2.20* 2.56* 2.24* 2.18*  CALCIUM 8.5* 8.2* 8.3* 8.3* 7.9*  MG 2.3  --  2.1  --  1.6*   GFR: Estimated Creatinine Clearance: 18.6 mL/min (A) (by C-G formula based on SCr of 2.18 mg/dL (H)). Liver Function Tests: Recent Labs  Lab 11/05/23 0927  AST 37  ALT 30  ALKPHOS 102  BILITOT 1.5*  PROT 6.2*  ALBUMIN  3.1*   No results for input(s): "LIPASE", "AMYLASE" in the last 168 hours. No results for input(s): "AMMONIA" in the last 168 hours. Coagulation Profile: Recent Labs  Lab 11/05/23 0927 11/09/23 1539  INR 1.3* 1.4*   Cardiac Enzymes: No results for input(s): "CKTOTAL", "CKMB", "CKMBINDEX", "TROPONINI" in the last 168 hours. BNP (last 3 results) No results for input(s): "PROBNP" in the last 8760 hours. HbA1C: No results for input(s): "HGBA1C" in the last 72 hours. CBG: Recent Labs  Lab 11/07/23 1616 11/07/23 1629 11/09/23 2205 11/10/23 0718 11/10/23 1230  GLUCAP 114* 101* 105* 81 123*   Lipid Profile: No results for input(s): "CHOL", "HDL", "LDLCALC", "TRIG", "CHOLHDL", "LDLDIRECT" in the last 72 hours. Thyroid  Function Tests: No results for input(s): "TSH", "T4TOTAL", "FREET4", "T3FREE", "THYROIDAB" in the last 72 hours. Anemia Panel: No results for input(s): "VITAMINB12", "FOLATE", "FERRITIN", "TIBC", "IRON", "RETICCTPCT" in the last 72 hours. Most Recent Urinalysis On File:     Component Value Date/Time   COLORURINE YELLOW (A) 11/05/2023 1505   APPEARANCEUR CLEAR (A) 11/05/2023 1505   APPEARANCEUR Clear 02/19/2013 0254    LABSPEC 1.012 11/05/2023 1505   LABSPEC 1.006 02/19/2013 0254   PHURINE 6.0 11/05/2023 1505   GLUCOSEU NEGATIVE 11/05/2023 1505   GLUCOSEU Negative 02/19/2013 0254   HGBUR NEGATIVE 11/05/2023 1505   BILIRUBINUR NEGATIVE 11/05/2023 1505   BILIRUBINUR Negative 02/19/2013 0254   KETONESUR NEGATIVE 11/05/2023 1505   PROTEINUR 100 (A) 11/05/2023 1505   NITRITE NEGATIVE 11/05/2023 1505   LEUKOCYTESUR NEGATIVE 11/05/2023 1505   LEUKOCYTESUR 1+ 02/19/2013 0254   Sepsis Labs: @LABRCNTIP (procalcitonin:4,lacticidven:4) Microbiology: Recent Results (from the past 240 hours)  Blood Culture (routine x 2)     Status: None   Collection Time: 11/05/23  9:17 AM   Specimen: BLOOD RIGHT FOREARM  Result Value Ref Range Status   Specimen Description BLOOD RIGHT FOREARM  Final   Special Requests   Final    BOTTLES DRAWN AEROBIC AND ANAEROBIC Blood Culture adequate volume   Culture   Final    NO GROWTH 5 DAYS Performed at [facility], [address] [place], [place] [address]    Report Status 11/10/2023 FINAL  Final  Blood Culture (routine x 2)     Status: None   Collection Time: 11/05/23  9:27 AM   Specimen: BLOOD  Result Value Ref Range Status   Specimen Description BLOOD RIGHT ANTECUBITAL  Final   Special Requests   Final    BOTTLES DRAWN AEROBIC AND ANAEROBIC Blood Culture adequate volume   Culture   Final    NO GROWTH 5 DAYS Performed at [facility], [address] [place], [place] [address]    Report Status 11/10/2023 FINAL  Final  Resp panel by RT-PCR (RSV, Flu A&B, Covid) Anterior Nasal Swab     Status: None   Collection Time: 11/05/23  9:27 AM   Specimen: Anterior Nasal Swab  Result Value Ref Range Status   SARS Coronavirus 2 by RT PCR NEGATIVE NEGATIVE Final    Comment: (NOTE) SARS-CoV-2 target nucleic acids are NOT DETECTED.  The SARS-CoV-2 RNA is generally detectable in upper respiratory specimens during the acute phase of infection. The  lowest concentration of SARS-CoV-2 viral copies this assay can detect is 138 copies/mL. A negative result does not preclude SARS-Cov-2 infection and should not be used as the sole basis for treatment or other patient management decisions. A negative result may occur with  improper specimen collection/handling, submission of specimen other than nasopharyngeal swab, presence of viral mutation(s) within the areas targeted by this assay, and inadequate number of viral copies(<138 copies/mL). A negative result must be combined with clinical observations, patient history, and epidemiological information. The expected result is Negative.  Fact Sheet for Patients:  BloggerCourse.com  Fact Sheet for Healthcare Providers:  SeriousBroker.it  This test is no t yet approved or cleared by the [place]  FDA and  has been authorized for detection and/or diagnosis of SARS-CoV-2 by FDA under an Emergency Use Authorization (EUA). This EUA will remain  in effect (meaning this test can be used) for the duration of the COVID-19 declaration under Section 564(b)(1) of the Act, 21 U.S.C.section 360bbb-3(b)(1), unless the authorization is terminated  or revoked sooner.       Influenza A by PCR NEGATIVE NEGATIVE Final   Influenza B by PCR NEGATIVE NEGATIVE Final    Comment: (NOTE) The Xpert Xpress SARS-CoV-2/FLU/RSV plus assay is intended as an aid in the diagnosis of influenza from Nasopharyngeal swab specimens and should not be used as a sole basis for treatment. Nasal washings and aspirates are unacceptable for Xpert Xpress SARS-CoV-2/FLU/RSV testing.  Fact Sheet for Patients: BloggerCourse.com  Fact Sheet for Healthcare Providers: SeriousBroker.it  This test is not yet approved or cleared by the [place]  FDA and has been authorized for detection and/or diagnosis of SARS-CoV-2 by FDA under  an Emergency Use Authorization (EUA). This EUA will remain in effect (meaning this test can be used) for the duration of the COVID-19 declaration under Section 564(b)(1) of the Act, 21 U.S.C. section 360bbb-3(b)(1), unless the authorization is terminated or revoked.     Resp Syncytial Virus by PCR NEGATIVE NEGATIVE Final    Comment: (NOTE) Fact Sheet for Patients: BloggerCourse.com  Fact Sheet for Healthcare Providers: SeriousBroker.it  This test is not yet approved or cleared by the [place]  FDA and has been authorized for detection and/or diagnosis of SARS-CoV-2 by FDA under an Emergency Use Authorization (EUA). This EUA will remain in effect (meaning this test can be used) for the duration of the COVID-19 declaration under Section 564(b)(1) of the Act, 21 U.S.C. section 360bbb-3(b)(1), unless the authorization is terminated or revoked.  Performed at [facility], [address] [place], [place] [address]   Resp panel by RT-PCR (RSV, Flu A&B, Covid) Anterior Nasal Swab     Status: None   Collection Time: 11/09/23  3:39 PM   Specimen: Anterior Nasal Swab  Result Value Ref Range Status   SARS Coronavirus 2 by RT PCR NEGATIVE NEGATIVE Final    Comment: (NOTE) SARS-CoV-2 target nucleic acids are NOT DETECTED.  The SARS-CoV-2 RNA is generally detectable in upper respiratory specimens during the acute phase of infection. The lowest concentration of SARS-CoV-2 viral copies this assay can detect is 138 copies/mL. A negative result does not preclude SARS-Cov-2 infection and should not be used as the sole basis for treatment or other patient management decisions. A negative result may occur with  improper specimen collection/handling, submission of specimen other than nasopharyngeal swab, presence of viral mutation(s) within the areas targeted by this assay, and inadequate number of viral copies(<138 copies/mL). A  negative result must be combined with clinical  observations, patient history, and epidemiological information. The expected result is Negative.  Fact Sheet for Patients:  BloggerCourse.com  Fact Sheet for Healthcare Providers:  SeriousBroker.it  This test is no t yet approved or cleared by the [place]  FDA and  has been authorized for detection and/or diagnosis of SARS-CoV-2 by FDA under an Emergency Use Authorization (EUA). This EUA will remain  in effect (meaning this test can be used) for the duration of the COVID-19 declaration under Section 564(b)(1) of the Act, 21 U.S.C.section 360bbb-3(b)(1), unless the authorization is terminated  or revoked sooner.       Influenza A by PCR NEGATIVE NEGATIVE Final   Influenza B by PCR NEGATIVE NEGATIVE Final    Comment: (NOTE) The Xpert Xpress SARS-CoV-2/FLU/RSV plus assay is intended as an aid in the diagnosis of influenza from Nasopharyngeal swab specimens and should not be used as a sole basis for treatment. Nasal washings and aspirates are unacceptable for Xpert Xpress SARS-CoV-2/FLU/RSV testing.  Fact Sheet for Patients: BloggerCourse.com  Fact Sheet for Healthcare Providers: SeriousBroker.it  This test is not yet approved or cleared by the [place]  FDA and has been authorized for detection and/or diagnosis of SARS-CoV-2 by FDA under an Emergency Use Authorization (EUA). This EUA will remain in effect (meaning this test can be used) for the duration of the COVID-19 declaration under Section 564(b)(1) of the Act, 21 U.S.C. section 360bbb-3(b)(1), unless the authorization is terminated or revoked.     Resp Syncytial Virus by PCR NEGATIVE NEGATIVE Final    Comment: (NOTE) Fact Sheet for Patients: BloggerCourse.com  Fact Sheet for Healthcare  Providers: SeriousBroker.it  This test is not yet approved or cleared by the [place]  FDA and has been authorized for detection and/or diagnosis of SARS-CoV-2 by FDA under an Emergency Use Authorization (EUA). This EUA will remain in effect (meaning this test can be used) for the duration of the COVID-19 declaration under Section 564(b)(1) of the Act, 21 U.S.C. section 360bbb-3(b)(1), unless the authorization is terminated or revoked.  Performed at [facility], [address] [place], [place] [address]       Radiology Studies last 3 days: DG Chest 2 View Result Date: 11/09/2023 CLINICAL DATA:  Ongoing shortness of breath and difficulty sleeping. EXAM: CHEST - 2 VIEW COMPARISON:  11/05/2023. FINDINGS: The heart is enlarged and the mediastinal contour is within normal limits. There is atherosclerotic calcification of the aorta. Chronic interstitial prominence is noted bilaterally. There are small bilateral pleural effusions with atelectasis or infiltrate at the lung bases. No pneumothorax is seen. A dual lead pacemaker device is present over the left chest. Right shoulder arthroplasty changes are noted. There is evidence of prior rotator cuff repair at the left shoulder. IMPRESSION: 1. Atelectasis or infiltrate at the lung bases. 2. Small bilateral pleural effusions. 3. Aortic atherosclerosis. Electronically Signed   By: Wyvonnia Heimlich M.D.   On: 11/09/2023 18:01       Time spent: 50 min     Melodi Sprung, DO [facility] 11/10/2023, 3:42 PM    Dictation software may have been used to generate the above note. Typos may occur and escape review in typed/dictated notes. Please contact Dr Authur Leghorn directly for clarity if needed.  Staff may message me via secure chat in Epic  but this may not receive an immediate response,  please page me for urgent matters!  If 7PM-7AM, please contact night coverage [email or web address]

## 2023-11-10 NOTE — Progress Notes (Signed)
 Occupational Therapy Evaluation Patient Details Name: Leslie Parker MRN: [id number] DOB: 12/13/1932 Today's Date: 11/10/2023   History of Present Illness   Leslie Parker is a 89 y.o. female with medical history significant of sCHF with EF 25-30%, SSS (s/p of PPM), A fib on Eliquis , HTN, HLD, DM, COPD, CKD-IV, osteopenia, ileus, who presents with SOB.     Clinical Impressions Pt was seen for OT/PT evaluation this date. Prior to hospital admission, pt was MODI amb with 4WW at baseline. Pt lives alone in senior apartments with daily visits from her granddaughter who assist with IADLs. Pt presents to acute OT demonstrating impaired ADL performance and functional mobility 2/2 (See OT problem list for additional functional deficits). Bed mobility; CGA, seated on EOB to complete gown change, requires MINA to don new gown. Pt currently requires CGA during tranfers, she is able to stand with CGA while receiving MAXA to don/doff fresh adult diaper in standing, Bil UE supported on RW throughout. CGA for step pivot to the recliner with RW and no SOB or LOB noted. Pt demonstrated good DME management during transfer to recliner. MD in room to discuss code status. VSS monitored throughout. Pt would benefit from skilled OT services to address noted impairments and functional limitations (see below for any additional details) in order to maximize safety and independence while minimizing falls risk and caregiver burden. OT will follow acutely.      If plan is discharge home, recommend the following:   A little help with walking and/or transfers;A little help with bathing/dressing/bathroom;Assistance with cooking/housework;Assist for transportation;Help with stairs or ramp for entrance     Functional Status Assessment   Patient has had a recent decline in their functional status and demonstrates the ability to make significant improvements in function in a reasonable and predictable amount of time.      Equipment Recommendations   None recommended by OT     Recommendations for Other Services         Precautions/Restrictions   Precautions Precautions: Fall Recall of Precautions/Restrictions: Intact Restrictions Weight Bearing Restrictions Per Provider Order: No     Mobility Bed Mobility Overal bed mobility: Needs Assistance Bed Mobility: Supine to Sit     Supine to sit: Contact guard          Transfers Overall transfer level: Needs assistance Equipment used: Rolling walker (2 wheels) Transfers: Sit to/from Stand, Bed to chair/wheelchair/BSC Sit to Stand: Contact guard assist     Step pivot transfers: Contact guard assist     General transfer comment: Good safety awareness during t/f      Balance Overall balance assessment: Needs assistance Sitting-balance support: Feet supported, No upper extremity supported Sitting balance-[name]: Good Sitting balance - Comments: Good reach within BOS   Standing balance support: During functional activity, Bilateral upper extremity supported Standing balance-[name]: Fair                             ADL either performed or assessed with clinical judgement   ADL Overall ADL's : Needs assistance/impaired             Lower Body Bathing: Maximal assistance;Sit to/from stand   Upper Body Dressing : Minimal assistance;Sitting   Lower Body Dressing: Maximal assistance;Sit to/from stand   Toilet Transfer: Contact guard assist;Ambulation;Rolling walker (2 wheels)   Toileting- Clothing Manipulation and Hygiene: Maximal assistance       Functional mobility during ADLs: Contact guard  assist;Rolling walker (2 wheels) General ADL Comments: MINA donning gown in sitting, MAX A don briefs in standing, CGA+ RW for simulated toilet t/f     Vision         Perception         Praxis         Pertinent Vitals/Pain Pain Assessment Pain Assessment: No/denies pain     Extremity/Trunk  Assessment Upper Extremity Assessment Upper Extremity Assessment: Generalized weakness;Overall [facility] for tasks assessed   Lower Extremity Assessment Lower Extremity Assessment: Generalized weakness;Overall [facility] for tasks assessed;Defer to PT evaluation       Communication Communication Communication: No apparent difficulties Factors Affecting Communication: Hearing impaired   Cognition Arousal: Alert Behavior During Therapy: WFL for tasks assessed/performed Cognition: No family/caregiver present to determine baseline             OT - Cognition Comments: A/Ox4                 Following commands: Intact       Cueing  General Comments   Cueing Techniques: Verbal cues  RA throughout session, VSS throughout   Exercises Exercises: Other exercises Other Exercises Other Exercises: Edu: role of OT, safe ADL completion   Shoulder Instructions      Home Living Family/patient expects to be discharged to:: Private residence Living Arrangements: Alone Available Help at Discharge: Family;Available PRN/intermittently Type of Home: Apartment Home Access: Level entry     Home Layout: One level     Bathroom Shower/Tub: Producer, television/film/video: Handicapped height Bathroom Accessibility: Yes How Accessible: Accessible via walker Home Equipment: Rollator (4 wheels);Cane - single point;Grab bars - tub/shower;Grab bars - toilet;Lift chair;Transport chair;Tub bench;Rolling Walker (2 wheels);Shower seat   Additional Comments: Senior living apartment with granddaughter able to stay with patient as needed, has call bells in multiple rooms for help if needed.  Reports daughter (from [place]) has been in town and planning to return home soon.      Prior Functioning/Environment Prior Level of Function : Needs assist             Mobility Comments: Mod Ind amb in the home with a rollator, no falls in the last 6 months, family assists pt in a transport chair for community  access ADLs Comments: Granddaughter assists with IADLs including errands and obtaining groceries    OT Problem List: Decreased strength;Decreased activity tolerance;Decreased safety awareness   OT Treatment/Interventions: Self-care/ADL training;Therapeutic exercise;Therapeutic activities;Energy conservation;DME and/or AE instruction;Patient/family education;Balance training      OT Goals(Current goals can be found in the care plan section)   Acute Rehab OT Goals Patient Stated Goal: Return home OT Goal Formulation: With patient Time For Goal Achievement: 11/24/23 Potential to Achieve Goals: Good ADL Goals Pt Will Perform Grooming: with modified independence;standing Pt Will Perform Lower Body Dressing: with modified independence;sit to/from stand Pt Will Transfer to Toilet: with modified independence;ambulating Pt Will Perform Toileting - Clothing Manipulation and hygiene: with modified independence   OT Frequency:  Min 2X/week    Co-evaluation PT/OT/SLP Co-Evaluation/Treatment: Yes Reason for Co-Treatment: For patient/therapist safety;To address functional/ADL transfers   OT goals addressed during session: Proper use of Adaptive equipment and DME;ADL's and self-care      AM-PAC OT "6 Clicks" Daily Activity     Outcome Measure Help from another person eating meals?: None Help from another person taking care of personal grooming?: None Help from another person toileting, which includes using toliet, bedpan, or urinal?: A Little Help  from another person bathing (including washing, rinsing, drying)?: A Little Help from another person to put on and taking off regular upper body clothing?: A Little Help from another person to put on and taking off regular lower body clothing?: A Lot 6 Click Score: 19   End of Session Equipment Utilized During Treatment: Rolling walker (2 wheels) Nurse Communication: Mobility status  Activity Tolerance: Patient tolerated treatment  well Patient left: in chair;with call bell/phone within reach;with chair alarm set  OT Visit Diagnosis: Unsteadiness on feet (R26.81);Muscle weakness (generalized) (M62.81)                Time: 1610-9604 OT Time Calculation (min): 31 min Charges:  OT General Charges $OT Visit: 1 Visit OT Evaluation $OT Eval Moderate Complexity: 1 Mod OT Treatments $Self Care/Home Management : 8-22 mins  Rosaria Common M.S. OTR/L  11/10/23, 1:10 PM

## 2023-11-10 NOTE — Hospital Course (Addendum)
 Hospital course / significant events:   HPI: Leslie Parker is a 89 y.o. female with medical history significant of sCHF with EF 25-30%, SSS (s/p of PPM), A fib on Eliquis , HTN, HLD, DM, COPD, CKD-IV, osteopenia, ileus, who presents with SOB, LE edema, orthopnea.   Of note, recent admission for similar, multiple admissions for end-stage CHF EF 25-30% - 7 admissions since January 2025 for very similar issues. Pt has pt refused placement to SNF or ALF.   04/19: admitted to hospitalist for CHF  04/20: still very SOB, reviewed CXR< pleural effusion definitely advanced from previous imaging, US  +/- thoracentesis ordered. D/w patient re: code status given severely reduced EF and multiple hospitalizations / progression - opt for DNR.      Consultants:  none  Procedures/Surgeries: Pending possible thoracentesis 11/10/23       ASSESSMENT & PLAN:   Acute on chronic systolic CHF (congestive heart failure) [facility]): Patient has SOB, 1+ leg edema, significantly elevated BNP> 4500, clinically consistent with CHF exacerbation.  2D echo on 08/09/2023 showed EF 25-30%.  80 mg of lasix  on admission, then 40 mg bid Daily weights strict I/O's Low salt diet Fluid restriction   Myocardial injury likely demand ischemia. on Eliquis , not give aspirin  On imdur  allergic to statin Recheck as needed   COPD (chronic obstructive pulmonary disease)  No wheezing Bronchodilators  as needed Mucinex    HTN (hypertension) IV hydralazine  as needed Coreg , Imdur    Chronic a-fib Heart rate 50-60s in ED Eliquis  Coreg , amodarone   CKD (chronic kidney disease) stage 4, GFR 15-29 ml/min  Renal function stable.  Baseline creatinine 2.56 on 11/07/2023.  Her creatinine is 2.24, BUN 65, GFR 21. For now with BMP   Type II diabetes mellitus with renal manifestations Recent A1c 7.0.  Patient is taking glipizide  at home Sliding scale insulin       --- based on BMI: There is no height or weight on file to calculate  BMI.  Underweight - under 18  overweight - 25 to 29 obese - 30 or more Class 1 obesity: BMI of 30.0 to 34 Class 2 obesity: BMI of 35.0 to 39 Class 3 obesity: BMI of 40.0 to 49 Super Morbid Obesity: BMI 50-59 Super-super Morbid Obesity: BMI 60+ Significantly low or high BMI is associated with higher medical risk.  Weight management advised as adjunct to other disease management and risk reduction treatments    DVT prophylaxis: Eliquis  IV fluids: no continuous IV fluids  Nutrition: cardiac diet Central lines / other devices: none  Code Status: DNR see IPAL note from 11/10/23  ACP documentation reviewed: advanced directive and GOC documentation are on file in [facility]  [facility] needs: [facility] Medical barriers to dispo: CHF, thoracentesis. Expected medical readiness for discharge possibly 1-2 days.

## 2023-11-11 ENCOUNTER — Inpatient Hospital Stay

## 2023-11-11 DIAGNOSIS — I5023 Acute on chronic systolic (congestive) heart failure: Secondary | ICD-10-CM | POA: Diagnosis not present

## 2023-11-11 LAB — GLUCOSE, CAPILLARY
Glucose-Capillary: 119 mg/dL — ABNORMAL HIGH (ref 70–99)
Glucose-Capillary: 130 mg/dL — ABNORMAL HIGH (ref 70–99)
Glucose-Capillary: 184 mg/dL — ABNORMAL HIGH (ref 70–99)
Glucose-Capillary: 90 mg/dL (ref 70–99)

## 2023-11-11 LAB — BODY FLUID CELL COUNT WITH DIFFERENTIAL
Eos, Fluid: 0 %
Lymphs, Fluid: 39 %
Monocyte-Macrophage-Serous Fluid: 53 %
Neutrophil Count, Fluid: 8 %
Total Nucleated Cell Count, Fluid: 116 uL

## 2023-11-11 LAB — BASIC METABOLIC PANEL WITH GFR
Anion gap: 9 (ref 5–15)
BUN: 58 mg/dL — ABNORMAL HIGH (ref 8–23)
CO2: 28 mmol/L (ref 22–32)
Calcium: 8.1 mg/dL — ABNORMAL LOW (ref 8.9–10.3)
Chloride: 104 mmol/L (ref 98–111)
Creatinine, Ser: 2.06 mg/dL — ABNORMAL HIGH (ref 0.44–1.00)
GFR, Estimated: 23 mL/min — ABNORMAL LOW (ref >60)
Glucose, Bld: 118 mg/dL — ABNORMAL HIGH (ref 70–99)
Potassium: 3.5 mmol/L (ref 3.5–5.1)
Sodium: 141 mmol/L (ref 135–145)

## 2023-11-11 LAB — ALBUMIN, PLEURAL OR PERITONEAL FLUID: Albumin, Fluid: <1.5 g/dL

## 2023-11-11 LAB — LACTATE DEHYDROGENASE, PLEURAL OR PERITONEAL FLUID: LD, Fluid: 48 U/L — ABNORMAL HIGH (ref 3–23)

## 2023-11-11 LAB — GLUCOSE, PLEURAL OR PERITONEAL FLUID: Glucose, Fluid: 138 mg/dL

## 2023-11-11 MED ORDER — LIDOCAINE HCL (PF) 1 % IJ SOLN
10.0000 mL | Freq: Once | INTRAMUSCULAR | Status: AC
  Administered 2023-11-11: 10 mL via INTRADERMAL
  Filled 2023-11-11: qty 10

## 2023-11-11 NOTE — Progress Notes (Signed)
 PROGRESS NOTE    Leslie Parker   ZOX:[id number] DOB: Feb 16, 1933  DOA: 11/09/2023 Date of Service: 11/11/23 which is hospital day 1  PCP: Rex Castor, MD    Hospital course / significant events:   HPI: Leslie Parker is a 89 y.o. female with medical history significant of sCHF with EF 25-30%, SSS (s/p of PPM), A fib on Eliquis , HTN, HLD, DM, COPD, CKD-IV, osteopenia, ileus, who presents with SOB, LE edema, orthopnea.   Of note, recent admission for similar, multiple admissions for end-stage CHF EF 25-30% - 7 admissions since January 2025 for very similar issues. Pt has pt refused placement to SNF or ALF.   04/19: admitted to hospitalist for CHF  04/20: still very SOB, reviewed CXR - pleural effusion progressing from previous imaging, US  +/- thoracentesis ordered. D/w patient re: code status given severely reduced EF and multiple hospitalizations / progression - opt for DNR.  04/21: thoracentesis today      Consultants:  none  Procedures/Surgeries: 11/11/23 thoracentesis        ASSESSMENT & PLAN:   Acute on chronic systolic CHF (congestive heart failure) [facility]): Patient has SOB, 1+ leg edema, significantly elevated BNP> 4500, clinically consistent with CHF exacerbation.  2D echo on 08/09/2023 showed EF 25-30%.  80 mg of lasix  on admission, then 40 mg bid Daily weights strict I/O's Low salt diet Fluid restriction   Myocardial injury likely demand ischemia. on Eliquis , not give aspirin  On imdur  allergic to statin Recheck as needed   COPD (chronic obstructive pulmonary disease)  No wheezing Bronchodilators  as needed Mucinex    HTN (hypertension) IV hydralazine  as needed Coreg , Imdur    Chronic a-fib Heart rate 50-60s in ED Eliquis  Coreg , amodarone   CKD (chronic kidney disease) stage 4, GFR 15-29 ml/min  Renal function stable.  Baseline creatinine 2.56 on 11/07/2023.  Her creatinine is 2.24, BUN 65, GFR 21. For now with BMP   Type II diabetes mellitus  with renal manifestations Recent A1c 7.0.  Patient is taking glipizide  at home Sliding scale insulin       --- based on BMI: There is no height or weight on file to calculate BMI.  Underweight - under 18  overweight - 25 to 29 obese - 30 or more Class 1 obesity: BMI of 30.0 to 34 Class 2 obesity: BMI of 35.0 to 39 Class 3 obesity: BMI of 40.0 to 49 Super Morbid Obesity: BMI 50-59 Super-super Morbid Obesity: BMI 60+ Significantly low or high BMI is associated with higher medical risk.  Weight management advised as adjunct to other disease management and risk reduction treatments    DVT prophylaxis: Eliquis  IV fluids: no continuous IV fluids  Nutrition: cardiac diet Central lines / other devices: none  Code Status: DNR see IPAL note from 11/10/23  ACP documentation reviewed: advanced directive and GOC documentation are on file in [facility]  [facility] needs: [facility] Medical barriers to dispo: CHF, thoracentesis. Expected medical readiness for discharge possibly 1-2 days.              Subjective / Brief ROS:  Patient reports SOB this morning about the same or slightly better  Denies CP Pain controlled.  Denies new weakness.  Tolerating diet.  Reports no concerns w/ urination/defecation.   Family Communication: will call granddaughter later today     Objective Findings:  Vitals:   11/11/23 0543 11/11/23 0829 11/11/23 1102 11/11/23 1145  BP: (!) 142/66 (!) 166/67 (!) 142/77 (!) 166/69  Pulse: 67 65 60 60  Resp: 20     Temp: 97.7 F (36.5 C) 98.7 F (37.1 C)    TempSrc:      SpO2: 92% 94% 95% 97%  Weight:      Height:        Intake/Output Summary (Last 24 hours) at 11/11/2023 1237 Last data filed at 11/11/2023 4098 Gross per 24 hour  Intake 360 ml  Output 2000 ml  Net -1640 ml   Filed Weights   11/10/23 1437 11/10/23 1520 11/11/23 0500  Weight: 77.8 kg 77.8 kg 72.6 kg    Examination:  Physical Exam Constitutional:      General: She is not in acute  distress. Cardiovascular:     Rate and Rhythm: Normal rate and regular rhythm.     Heart sounds: Murmur heard.  Pulmonary:     Effort: No tachypnea or respiratory distress.     Breath sounds: Examination of the right-middle field reveals decreased breath sounds and rales. Examination of the left-middle field reveals decreased breath sounds and rales. Examination of the right-lower field reveals decreased breath sounds. Examination of the left-lower field reveals decreased breath sounds. Decreased breath sounds and rales present.  Musculoskeletal:     Right lower leg: Edema present.     Left lower leg: Edema present.  Skin:    General: Skin is warm and dry.  Neurological:     General: No focal deficit present.     Mental Status: She is alert and oriented to person, place, and time.  Psychiatric:        Mood and Affect: Mood normal.        Behavior: Behavior normal.          Scheduled Medications:   amiodarone   200 mg Oral Daily   apixaban   2.5 mg Oral BID   ascorbic acid   250 mg Oral Daily   budeson-glycopyrrolate -formoterol   2 puff Inhalation BID   carvedilol   6.25 mg Oral BID WC   cholecalciferol   1,000 Units Oral Daily   cyanocobalamin   2,500 mcg Oral Daily   furosemide   40 mg Intravenous Q12H   insulin  aspart  0-5 Units Subcutaneous QHS   insulin  aspart  0-9 Units Subcutaneous TID WC   isosorbide  mononitrate  30 mg Oral QHS   magnesium  oxide  400 mg Oral Daily   multivitamin with minerals  1 tablet Oral Daily   traZODone   100 mg Oral QHS    Continuous Infusions:   PRN Medications:  acetaminophen , albuterol , dextromethorphan -guaiFENesin , hydrALAZINE , ondansetron  (ZOFRAN ) IV  Antimicrobials from admission:  Anti-infectives (From admission, onward)    None           Data Reviewed:  I have personally reviewed the following...  CBC: Recent Labs  Lab 11/05/23 0927 11/06/23 0414 11/09/23 1539 11/10/23 0350  WBC 6.3 5.3 5.9 5.0  NEUTROABS 4.2  --    --   --   HGB 11.6* 10.3* 10.9* 9.7*  HCT 35.4* 30.3* 32.9* 29.5*  MCV 93.9 92.4 93.5 95.5  PLT 338 268 285 243   Basic Metabolic Panel: Recent Labs  Lab 11/05/23 0927 11/06/23 0414 11/07/23 0839 11/09/23 1539 11/10/23 0350 11/11/23 0539  NA 137 138 138 138 136 141  K 3.6 3.7 4.2 3.6 3.3* 3.5  CL 102 105 103 101 104 104  CO2 24 24 25 25 25 28   GLUCOSE 150* 189* 138* 111* 86 118*  BUN 52* 57* 71* 65* 60* 58*  CREATININE 2.29* 2.20* 2.56* 2.24* 2.18* 2.06*  CALCIUM 8.5*  8.2* 8.3* 8.3* 7.9* 8.1*  MG 2.3  --  2.1  --  1.6*  --    GFR: Estimated Creatinine Clearance: 19.7 mL/min (A) (by C-G formula based on SCr of 2.06 mg/dL (H)). Liver Function Tests: Recent Labs  Lab 11/05/23 0927  AST 37  ALT 30  ALKPHOS 102  BILITOT 1.5*  PROT 6.2*  ALBUMIN  3.1*   No results for input(s): "LIPASE", "AMYLASE" in the last 168 hours. No results for input(s): "AMMONIA" in the last 168 hours. Coagulation Profile: Recent Labs  Lab 11/05/23 0927 11/09/23 1539  INR 1.3* 1.4*   Cardiac Enzymes: No results for input(s): "CKTOTAL", "CKMB", "CKMBINDEX", "TROPONINI" in the last 168 hours. BNP (last 3 results) No results for input(s): "PROBNP" in the last 8760 hours. HbA1C: No results for input(s): "HGBA1C" in the last 72 hours. CBG: Recent Labs  Lab 11/10/23 1230 11/10/23 1715 11/10/23 2045 11/11/23 0825 11/11/23 1221  GLUCAP 123* 107* 118* 119* 130*   Lipid Profile: No results for input(s): "CHOL", "HDL", "LDLCALC", "TRIG", "CHOLHDL", "LDLDIRECT" in the last 72 hours. Thyroid  Function Tests: No results for input(s): "TSH", "T4TOTAL", "FREET4", "T3FREE", "THYROIDAB" in the last 72 hours. Anemia Panel: No results for input(s): "VITAMINB12", "FOLATE", "FERRITIN", "TIBC", "IRON", "RETICCTPCT" in the last 72 hours. Most Recent Urinalysis On File:     Component Value Date/Time   COLORURINE YELLOW (A) 11/05/2023 1505   APPEARANCEUR CLEAR (A) 11/05/2023 1505   APPEARANCEUR Clear  02/19/2013 0254   LABSPEC 1.012 11/05/2023 1505   LABSPEC 1.006 02/19/2013 0254   PHURINE 6.0 11/05/2023 1505   GLUCOSEU NEGATIVE 11/05/2023 1505   GLUCOSEU Negative 02/19/2013 0254   HGBUR NEGATIVE 11/05/2023 1505   BILIRUBINUR NEGATIVE 11/05/2023 1505   BILIRUBINUR Negative 02/19/2013 0254   KETONESUR NEGATIVE 11/05/2023 1505   PROTEINUR 100 (A) 11/05/2023 1505   NITRITE NEGATIVE 11/05/2023 1505   LEUKOCYTESUR NEGATIVE 11/05/2023 1505   LEUKOCYTESUR 1+ 02/19/2013 0254   Sepsis Labs: @LABRCNTIP (procalcitonin:4,lacticidven:4) Microbiology: Recent Results (from the past 240 hours)  Blood Culture (routine x 2)     Status: None   Collection Time: 11/05/23  9:17 AM   Specimen: BLOOD RIGHT FOREARM  Result Value Ref Range Status   Specimen Description BLOOD RIGHT FOREARM  Final   Special Requests   Final    BOTTLES DRAWN AEROBIC AND ANAEROBIC Blood Culture adequate volume   Culture   Final    NO GROWTH 5 DAYS Performed at [facility], [address], [place], [place] [address]    Report Status 11/10/2023 FINAL  Final  Blood Culture (routine x 2)     Status: None   Collection Time: 11/05/23  9:27 AM   Specimen: BLOOD  Result Value Ref Range Status   Specimen Description BLOOD RIGHT ANTECUBITAL  Final   Special Requests   Final    BOTTLES DRAWN AEROBIC AND ANAEROBIC Blood Culture adequate volume   Culture   Final    NO GROWTH 5 DAYS Performed at [facility], [address] [place], [place] [address]    Report Status 11/10/2023 FINAL  Final  Resp panel by RT-PCR (RSV, Flu A&B, Covid) Anterior Nasal Swab     Status: None   Collection Time: 11/05/23  9:27 AM   Specimen: Anterior Nasal Swab  Result Value Ref Range Status   SARS Coronavirus 2 by RT PCR NEGATIVE NEGATIVE Final    Comment: (NOTE) SARS-CoV-2 target nucleic acids are NOT DETECTED.  The SARS-CoV-2 RNA is generally detectable in upper  respiratory specimens during the acute phase of infection.  The lowest concentration of SARS-CoV-2 viral copies this assay can detect is 138 copies/mL. A negative result does not preclude SARS-Cov-2 infection and should not be used as the sole basis for treatment or other patient management decisions. A negative result may occur with  improper specimen collection/handling, submission of specimen other than nasopharyngeal swab, presence of viral mutation(s) within the areas targeted by this assay, and inadequate number of viral copies(<138 copies/mL). A negative result must be combined with clinical observations, patient history, and epidemiological information. The expected result is Negative.  Fact Sheet for Patients:  BloggerCourse.com  Fact Sheet for Healthcare Providers:  SeriousBroker.it  This test is no t yet approved or cleared by the [place]  FDA and  has been authorized for detection and/or diagnosis of SARS-CoV-2 by FDA under an Emergency Use Authorization (EUA). This EUA will remain  in effect (meaning this test can be used) for the duration of the COVID-19 declaration under Section 564(b)(1) of the Act, 21 U.S.C.section 360bbb-3(b)(1), unless the authorization is terminated  or revoked sooner.       Influenza A by PCR NEGATIVE NEGATIVE Final   Influenza B by PCR NEGATIVE NEGATIVE Final    Comment: (NOTE) The Xpert Xpress SARS-CoV-2/FLU/RSV plus assay is intended as an aid in the diagnosis of influenza from Nasopharyngeal swab specimens and should not be used as a sole basis for treatment. Nasal washings and aspirates are unacceptable for Xpert Xpress SARS-CoV-2/FLU/RSV testing.  Fact Sheet for Patients: BloggerCourse.com  Fact Sheet for Healthcare Providers: SeriousBroker.it  This test is not yet approved or cleared by the [place]  FDA and has been authorized for detection and/or diagnosis of SARS-CoV-2 by FDA  under an Emergency Use Authorization (EUA). This EUA will remain in effect (meaning this test can be used) for the duration of the COVID-19 declaration under Section 564(b)(1) of the Act, 21 U.S.C. section 360bbb-3(b)(1), unless the authorization is terminated or revoked.     Resp Syncytial Virus by PCR NEGATIVE NEGATIVE Final    Comment: (NOTE) Fact Sheet for Patients: BloggerCourse.com  Fact Sheet for Healthcare Providers: SeriousBroker.it  This test is not yet approved or cleared by the [place]  FDA and has been authorized for detection and/or diagnosis of SARS-CoV-2 by FDA under an Emergency Use Authorization (EUA). This EUA will remain in effect (meaning this test can be used) for the duration of the COVID-19 declaration under Section 564(b)(1) of the Act, 21 U.S.C. section 360bbb-3(b)(1), unless the authorization is terminated or revoked.  Performed at [facility], [address] [place], [place] [address]   Resp panel by RT-PCR (RSV, Flu A&B, Covid) Anterior Nasal Swab     Status: None   Collection Time: 11/09/23  3:39 PM   Specimen: Anterior Nasal Swab  Result Value Ref Range Status   SARS Coronavirus 2 by RT PCR NEGATIVE NEGATIVE Final    Comment: (NOTE) SARS-CoV-2 target nucleic acids are NOT DETECTED.  The SARS-CoV-2 RNA is generally detectable in upper respiratory specimens during the acute phase of infection. The lowest concentration of SARS-CoV-2 viral copies this assay can detect is 138 copies/mL. A negative result does not preclude SARS-Cov-2 infection and should not be used as the sole basis for treatment or other patient management decisions. A negative result may occur with  improper specimen collection/handling, submission of specimen other than nasopharyngeal swab, presence of viral mutation(s) within the areas targeted by this assay, and inadequate number of viral copies(<138  copies/mL).  A negative result must be combined with clinical observations, patient history, and epidemiological information. The expected result is Negative.  Fact Sheet for Patients:  BloggerCourse.com  Fact Sheet for Healthcare Providers:  SeriousBroker.it  This test is no t yet approved or cleared by the [place]  FDA and  has been authorized for detection and/or diagnosis of SARS-CoV-2 by FDA under an Emergency Use Authorization (EUA). This EUA will remain  in effect (meaning this test can be used) for the duration of the COVID-19 declaration under Section 564(b)(1) of the Act, 21 U.S.C.section 360bbb-3(b)(1), unless the authorization is terminated  or revoked sooner.       Influenza A by PCR NEGATIVE NEGATIVE Final   Influenza B by PCR NEGATIVE NEGATIVE Final    Comment: (NOTE) The Xpert Xpress SARS-CoV-2/FLU/RSV plus assay is intended as an aid in the diagnosis of influenza from Nasopharyngeal swab specimens and should not be used as a sole basis for treatment. Nasal washings and aspirates are unacceptable for Xpert Xpress SARS-CoV-2/FLU/RSV testing.  Fact Sheet for Patients: BloggerCourse.com  Fact Sheet for Healthcare Providers: SeriousBroker.it  This test is not yet approved or cleared by the [place]  FDA and has been authorized for detection and/or diagnosis of SARS-CoV-2 by FDA under an Emergency Use Authorization (EUA). This EUA will remain in effect (meaning this test can be used) for the duration of the COVID-19 declaration under Section 564(b)(1) of the Act, 21 U.S.C. section 360bbb-3(b)(1), unless the authorization is terminated or revoked.     Resp Syncytial Virus by PCR NEGATIVE NEGATIVE Final    Comment: (NOTE) Fact Sheet for Patients: BloggerCourse.com  Fact Sheet for Healthcare  Providers: SeriousBroker.it  This test is not yet approved or cleared by the [place]  FDA and has been authorized for detection and/or diagnosis of SARS-CoV-2 by FDA under an Emergency Use Authorization (EUA). This EUA will remain in effect (meaning this test can be used) for the duration of the COVID-19 declaration under Section 564(b)(1) of the Act, 21 U.S.C. section 360bbb-3(b)(1), unless the authorization is terminated or revoked.  Performed at [facility], [address], [place], [place] [address]       Radiology Studies last 3 days: DG Chest 2 View Result Date: 11/09/2023 CLINICAL DATA:  Ongoing shortness of breath and difficulty sleeping. EXAM: CHEST - 2 VIEW COMPARISON:  11/05/2023. FINDINGS: The heart is enlarged and the mediastinal contour is within normal limits. There is atherosclerotic calcification of the aorta. Chronic interstitial prominence is noted bilaterally. There are small bilateral pleural effusions with atelectasis or infiltrate at the lung bases. No pneumothorax is seen. A dual lead pacemaker device is present over the left chest. Right shoulder arthroplasty changes are noted. There is evidence of prior rotator cuff repair at the left shoulder. IMPRESSION: 1. Atelectasis or infiltrate at the lung bases. 2. Small bilateral pleural effusions. 3. Aortic atherosclerosis. Electronically Signed   By: Wyvonnia Heimlich M.D.   On: 11/09/2023 18:01       Time spent: 50 min     Melodi Sprung, DO [facility] 11/11/2023, 12:37 PM    Dictation software may have been used to generate the above note. Typos may occur and escape review in typed/dictated notes. Please contact Dr Authur Leghorn directly for clarity if needed.  Staff may message me via secure chat in Epic  but this may not receive an immediate response,  please page me for urgent matters!  If 7PM-7AM, please contact night coverage [email or web address]

## 2023-11-11 NOTE — TOC Initial Note (Addendum)
 Transition of Care [facility]) - Initial/Assessment Note    Patient Details  Name: Leslie Parker MRN: [id number] Date of Birth: 1933/07/01  [facility]) CM/SW Contact:    Milam Allbaugh C Morene Cecilio, RN Phone Number: 11/11/2023, 3:42 PM  Clinical Narrative:                   Admitted for: SOB Admitted from: Home alone Current home health/prior home health/DME: Patient is active with [facility] for RN/ PT/ OT, and HHA.  Transportation: "My granddaughter."           Patient Goals and CMS Choice            Expected Discharge Plan and Services                                              Prior Living Arrangements/Services                       Activities of Daily Living   ADL Screening (condition at time of admission) Independently performs ADLs?: No Does the patient have a NEW difficulty with bathing/dressing/toileting/self-feeding that is expected to last >3 days?: No Does the patient have a NEW difficulty with getting in/out of bed, walking, or climbing stairs that is expected to last >3 days?: No Does the patient have a NEW difficulty with communication that is expected to last >3 days?: No Is the patient deaf or have difficulty hearing?: No Does the patient have difficulty seeing, even when wearing glasses/contacts?: No Does the patient have difficulty concentrating, remembering, or making decisions?: No  Permission Sought/Granted                  Emotional Assessment              Admission diagnosis:  Acute on chronic systolic CHF (congestive heart failure) (HCC) [I50.23] Dyspnea, unspecified type [R06.00] Acute on chronic congestive heart failure, unspecified heart failure type (HCC) [I50.9] CHF exacerbation (HCC) [I50.9] Patient Active Problem List   Diagnosis Date Noted   Do not resuscitate 11/10/2023   CHF exacerbation (HCC) 11/10/2023   Myocardial injury 11/09/2023   AKI (acute kidney injury) (HCC) 11/05/2023   Insomnia  11/05/2023   Frequent hospital admissions 11/05/2023   Acute exacerbation of CHF (congestive heart failure) (HCC) 10/22/2023   Elevated troponin 10/22/2023   Malnutrition of moderate degree 09/17/2023   CHF (congestive heart failure) (HCC) 09/15/2023   Acute on chronic systolic CHF (congestive heart failure) (HCC) 08/19/2023   Pleural effusion due to CHF (congestive heart failure) (HCC) 08/19/2023   HTN (hypertension), malignant 08/19/2023   Ileus (HCC) 08/12/2023   Large bowel obstruction (HCC) 08/12/2023   CKD (chronic kidney disease) stage 4, GFR 15-29 ml/min (HCC) 08/12/2023   HFrEF (heart failure with reduced ejection fraction) (HCC) 08/12/2023   Acute on chronic combined systolic and diastolic CHF (congestive heart failure) (HCC) 08/10/2023   Hyponatremia 05/05/2023   Overweight (BMI 25.0-29.9) 05/01/2023   Left hip pain 05/01/2023   Hypokalemia 04/30/2023   Anemia 03/21/2023   Abnormal LFTs 03/20/2023   Type II diabetes mellitus with renal manifestations (HCC) 02/13/2023   Chronic diastolic CHF (congestive heart failure) (HCC) 02/13/2023   CKD stage 3 due to type 2 diabetes mellitus (HCC) 11/24/2022   Paroxysmal atrial fibrillation (HCC) 11/24/2022   Sick sinus syndrome (HCC) 11/15/2022  Acute on chronic diastolic CHF (congestive heart failure) (HCC) 08/27/2022   COPD (chronic obstructive pulmonary disease) (HCC) 08/23/2022   Chronic a-fib (HCC) 08/22/2022   Diabetes mellitus without complication (HCC)    Stage 3b chronic kidney disease (HCC) - Baseline scr 1.8-2.0    Postural dizziness with presyncope    Hypomagnesemia    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 [place] [address] Phone: [phone] Fax: [phone]  [id number] [place], [place]  - [address] [place] [address] Phone: [phone] Fax: [phone]  [facility] - [facility] [address] [place] [address] Phone: [phone] Fax: [phone]     Social Drivers of Health (SDOH) Social History: SDOH Screenings   Food Insecurity: No Food Insecurity (11/10/2023)  Housing: Low Risk  (11/10/2023)  Transportation Needs: No Transportation Needs (11/10/2023)  Utilities: Not At Risk (11/10/2023)  Alcohol Screen: Low Risk  (08/20/2023)  Financial Resource Strain: Low Risk  (08/20/2023)  Social Connections: Moderately Integrated (11/10/2023)  Tobacco Use: Low Risk  (11/09/2023)   SDOH Interventions:     Readmission Risk Interventions    10/22/2023    2:28 PM 09/16/2023    2:52 PM 08/20/2023    3:45 PM  Readmission Risk Prevention Plan  Transportation Screening Complete Complete Complete  Medication Review Oceanographer) Complete Complete Complete  PCP or Specialist appointment within 3-5 days of discharge  Complete Complete  HRI or Home Care Consult Not Complete Patient refused Complete  HRI or Home Care Consult Pt Refusal Comments No PT recs at this time.    SW Recovery Care/Counseling Consult Complete Complete Complete  Palliative Care Screening Not Applicable Not Applicable Not Applicable  Skilled Nursing Facility Not Applicable Not Applicable Not Applicable

## 2023-11-11 NOTE — Procedures (Signed)
 PROCEDURE SUMMARY:  Successful image-guided thoracentesis from the left chest Yielded 850 milliliters of yellow fluid.  No immediate complications.  EBL: zero Patient tolerated well.   Please see imaging section of Epic for full dictation.  Lorea Kupfer NP 11/11/2023 2:18 PM

## 2023-11-12 DIAGNOSIS — I5023 Acute on chronic systolic (congestive) heart failure: Secondary | ICD-10-CM | POA: Diagnosis not present

## 2023-11-12 LAB — CBC
HCT: 29.9 % — ABNORMAL LOW (ref 36.0–46.0)
Hemoglobin: 9.9 g/dL — ABNORMAL LOW (ref 12.0–15.0)
MCH: 31.3 pg (ref 26.0–34.0)
MCHC: 33.1 g/dL (ref 30.0–36.0)
MCV: 94.6 fL (ref 80.0–100.0)
Platelets: 218 10*3/uL (ref 150–400)
RBC: 3.16 MIL/uL — ABNORMAL LOW (ref 3.87–5.11)
RDW: 15.6 % — ABNORMAL HIGH (ref 11.5–15.5)
WBC: 5 10*3/uL (ref 4.0–10.5)
nRBC: 0 % (ref 0.0–0.2)

## 2023-11-12 LAB — BASIC METABOLIC PANEL WITH GFR
Anion gap: 10 (ref 5–15)
BUN: 57 mg/dL — ABNORMAL HIGH (ref 8–23)
CO2: 28 mmol/L (ref 22–32)
Calcium: 8.3 mg/dL — ABNORMAL LOW (ref 8.9–10.3)
Chloride: 103 mmol/L (ref 98–111)
Creatinine, Ser: 1.99 mg/dL — ABNORMAL HIGH (ref 0.44–1.00)
GFR, Estimated: 24 mL/min — ABNORMAL LOW (ref >60)
Glucose, Bld: 107 mg/dL — ABNORMAL HIGH (ref 70–99)
Potassium: 3.9 mmol/L (ref 3.5–5.1)
Sodium: 141 mmol/L (ref 135–145)

## 2023-11-12 LAB — GLUCOSE, CAPILLARY
Glucose-Capillary: 116 mg/dL — ABNORMAL HIGH (ref 70–99)
Glucose-Capillary: 144 mg/dL — ABNORMAL HIGH (ref 70–99)

## 2023-11-12 MED ORDER — ACETAMINOPHEN 325 MG PO TABS: 650.0000 mg | ORAL_TABLET | Freq: Four times a day (QID) | ORAL | Status: DC | PRN

## 2023-11-12 MED ORDER — TORSEMIDE 20 MG PO TABS
20.0000 mg | ORAL_TABLET | Freq: Two times a day (BID) | ORAL | 0 refills | Status: DC
Start: 2023-11-12 — End: 2023-11-29

## 2023-11-12 NOTE — Progress Notes (Signed)
 Occupational Therapy Treatment Patient Details Name: Leslie Parker MRN: [id number] DOB: [birth year] Today's Date: 11/12/2023   History of present illness Leslie Parker is a 89 y.o. female with medical history significant of sCHF with EF 25-30%, SSS (s/p of PPM), A fib on Eliquis , HTN, HLD, DM, COPD, CKD-IV, osteopenia, ileus, who presents with SOB. Imaging found:  Atelectasis or infiltrate at the lung bases.  Small bilateral pleural effusions.   OT comments  Pt is supine in bed on arrival. Pleasant and agreeable to OT session. She denies pain. Pt performed bed mobility with SUP to Min A for BLE management to return to supine. Pt required SUP for STS and 90 feet of mobility using RW. Pt fatigues easily, but is able to perform UB/LB dressing tasks with set up to SUP via sit/stand at EOB. She is awaiting DC this afternoon.  Pt returned to bed with all needs in place and will cont to require skilled acute OT services to maximize her safety and IND to return to PLOF.       If plan is discharge home, recommend the following:      Equipment Recommendations       Recommendations for Other Services      Precautions / Restrictions Precautions Precautions: Fall Recall of Precautions/Restrictions: Intact Restrictions Weight Bearing Restrictions Per Provider Order: No       Mobility Bed Mobility Overal bed mobility: Needs Assistance Bed Mobility: Supine to Sit, Sit to Supine     Supine to sit: Supervision Sit to supine: Contact guard assist, Min assist   General bed mobility comments: MIN A for BLE management to return to supine    Transfers Overall transfer level: Needs assistance Equipment used: Rolling walker (2 wheels) Transfers: Sit to/from Stand Sit to Stand: Supervision           General transfer comment: STS and 90 feet ambulation with RW     Balance Overall balance assessment: Needs assistance Sitting-balance support: Feet supported, No upper extremity  supported Sitting balance-[name]: Good     Standing balance support: During functional activity, Single extremity supported, Reliant on assistive device for balance Standing balance-[name]: Good Standing balance comment: fatigues easily, but good balance for LB dressing                           ADL either performed or assessed with clinical judgement   ADL                   Upper Body Dressing : Set up;Sitting   Lower Body Dressing: Supervision/safety;Sit to/from stand;Sitting/lateral leans               Functional mobility during ADLs: Supervision/safety;Rolling walker (2 wheels)      Extremity/Trunk Assessment              Vision       Perception     Praxis     Communication Communication Communication: No apparent difficulties   Cognition Arousal: Alert Behavior During Therapy: WFL for tasks assessed/performed                                 Following commands: Intact        Cueing   Cueing Techniques: Verbal cues  Exercises      Shoulder Instructions       General Comments  Pertinent Vitals/ Pain       Pain Assessment Pain Assessment: No/denies pain  Home Living                                          Prior Functioning/Environment              Frequency           Progress Toward Goals  OT Goals(current goals can now be found in the care plan section)  Progress towards OT goals: Progressing toward goals  Acute Rehab OT Goals Patient Stated Goal: return home OT Goal Formulation: With patient Time For Goal Achievement: 11/24/23 Potential to Achieve Goals: Good  Plan      Co-evaluation                 AM-PAC OT "6 Clicks" Daily Activity     Outcome Measure                    End of Session Equipment Utilized During Treatment: Rolling walker (2 wheels)  OT Visit Diagnosis: Unsteadiness on feet (R26.81);Muscle weakness (generalized)  (M62.81)   Activity Tolerance Patient tolerated treatment well   Patient Left with call bell/phone within reach;in bed;with bed alarm set;with nursing/sitter in room   Nurse Communication Mobility status        Time: 4098-1191 OT Time Calculation (min): 14 min  Charges: OT General Charges $OT Visit: 1 Visit OT Treatments $Self Care/Home Management : 8-22 mins  Amiree No, OTR/L  11/12/23, 4:11 PM   Jeniah Kishi E Remingtyn Depaola 11/12/2023, 4:10 PM

## 2023-11-12 NOTE — Progress Notes (Signed)
 Heart Failure Navigator Progress Note  Assessed for Heart & Vascular TOC clinic readiness.  Patient does not meet criteria due to current [facility] patient.   Navigator will sign off at this time.  [name], RN, BSN [facility] Heart Failure Navigator Secure Chat Only

## 2023-11-12 NOTE — Progress Notes (Signed)
 Physical Therapy Treatment Patient Details Name: Leslie Parker MRN: [id number] DOB: 04/30/1933 Today's Date: 11/12/2023   History of Present Illness CAI FLOTT is a 89 y.o. female with medical history significant of sCHF with EF 25-30%, SSS (s/p of PPM), A fib on Eliquis , HTN, HLD, DM, COPD, CKD-IV, osteopenia, ileus, who presents with SOB. Imaging found:  Atelectasis or infiltrate at the lung bases.  Small bilateral pleural effusions.    PT Comments  Patient received in bed, pleasantly agreeable to PT session.  Patient able to complete bed mobility with supervision, increased time/effort to get LE's onto bed with sit > supine. Patient able to complete STS transfer with supervision, and ambulate approx 80 ft with RW and supervision. Distance limited due to fatigue this date. Good stability, no overt LOB with use of AD. Discharge recommendation remains appropriate. Patient eager to discharge home this date. Will continue to follow acutely.     If plan is discharge home, recommend the following: A little help with walking and/or transfers;A little help with bathing/dressing/bathroom;Assistance with cooking/housework;Direct supervision/assist for medications management;Direct supervision/assist for financial management;Help with stairs or ramp for entrance;Assist for transportation   Can travel by private vehicle        Equipment Recommendations  None recommended by PT    Recommendations for Other Services       Precautions / Restrictions Precautions Precautions: Fall Recall of Precautions/Restrictions: Intact Restrictions Weight Bearing Restrictions Per Provider Order: No     Mobility  Bed Mobility Overal bed mobility: Needs Assistance Bed Mobility: Supine to Sit, Sit to Supine     Supine to sit: Supervision Sit to supine: Contact guard assist   General bed mobility comments: Pt able to complete bed mobility with supervision/CGA, increased time required. Inc effort to get LE's  into bed with sit > supine    Transfers Overall transfer level: Needs assistance Equipment used: Rolling walker (2 wheels) Transfers: Sit to/from Stand Sit to Stand: Supervision           General transfer comment: Pt able to stand with supervision from bed with RW. Overall good stability/safety awareness noted.    Ambulation/Gait Ambulation/Gait assistance: Supervision Gait Distance (Feet): 80 Feet Assistive device: Rolling walker (2 wheels) Gait Pattern/deviations: Step-through pattern, Decreased step length - right, Decreased step length - left, Trunk flexed Gait velocity: Decreased     General Gait Details: Pt able to ambulate x 80 ft with use of RW, mild fatigue after completion. Supervision throughout.   Stairs             Wheelchair Mobility     Tilt Bed    Modified Rankin (Stroke Patients Only)       Balance Overall balance assessment: Needs assistance Sitting-balance support: Feet supported, No upper extremity supported Sitting balance-[name]: Good     Standing balance support: Bilateral upper extremity supported, During functional activity, Reliant on assistive device for balance Standing balance-[name]: Good Standing balance comment: Good stability with use of RW, good posture, just fatigues quickly.                            Communication Communication Communication: No apparent difficulties  Cognition Arousal: Alert Behavior During Therapy: WFL for tasks assessed/performed   PT - Cognitive impairments: No apparent impairments                         Following commands: Intact  Cueing Cueing Techniques: Verbal cues  Exercises Other Exercises Other Exercises: Education on safety/fall prevention within the home at discarge. Pt verbalize understanding    General Comments        Pertinent Vitals/Pain Pain Assessment Pain Assessment: No/denies pain    Home Living                           Prior Function            PT Goals (current goals can now be found in the care plan section) Acute Rehab PT Goals Patient Stated Goal: To return home PT Goal Formulation: With patient Time For Goal Achievement: 11/17/23 Potential to Achieve Goals: Good Progress towards PT goals: Progressing toward goals    Frequency    Min 2X/week      PT Plan      Co-evaluation              AM-PAC PT "6 Clicks" Mobility   Outcome Measure  Help needed turning from your back to your side while in a flat bed without using bedrails?: A Little Help needed moving from lying on your back to sitting on the side of a flat bed without using bedrails?: A Little Help needed moving to and from a bed to a chair (including a wheelchair)?: A Little Help needed standing up from a chair using your arms (e.g., wheelchair or bedside chair)?: A Little Help needed to walk in hospital room?: A Little Help needed climbing 3-5 steps with a railing? : A Lot 6 Click Score: 17    End of Session Equipment Utilized During Treatment: Gait belt Activity Tolerance: Patient tolerated treatment well Patient left: in bed;with bed alarm set;with call bell/phone within reach Nurse Communication: Mobility status PT Visit Diagnosis: Muscle weakness (generalized) (M62.81);Difficulty in walking, not elsewhere classified (R26.2)     Time: 1610-9604 PT Time Calculation (min) (ACUTE ONLY): 16 min  Charges:    $Therapeutic Activity: 8-22 mins PT General Charges $$ ACUTE PT VISIT: 1 Visit                     Donnie Galea, PT, DPT 11/12/23 1:41 PM

## 2023-11-12 NOTE — Discharge Summary (Signed)
 Physician Discharge Summary   Patient: Leslie Parker MRN: [id number]  DOB: 02-09-1933   Admit:     Date of Admission: 11/09/2023 Admitted from: home   Discharge: Date of discharge: 11/12/23 Disposition: Home health Condition at discharge: fair  CODE STATUS: DNR     Discharge Physician: Melodi Sprung, DO [facility]     PCP: Rex Castor, MD  Recommendations for Outpatient Follow-up:  Follow up with PCP Rex Castor, MD in 1-2 weeks Follow up with cardiology - keep appointment 05/06 if unable to be seen sooner  Pending results: body fluid culture (pleural fluid) though no WBC/organisms on gram stain do not expect infection,      Discharge Instructions     (HEART FAILURE PATIENTS) Call MD:  Anytime you have any of the following symptoms: 1) 3 pound weight gain in 24 hours or 5 pounds in 1 week 2) shortness of breath, with or without a dry hacking cough 3) swelling in the hands, feet or stomach 4) if you have to sleep on extra pillows at night in order to breathe.   Complete by: As directed    AMB referral to CHF clinic   Complete by: As directed    Reason for referral: Systolic HF   Diet - low sodium heart healthy   Complete by: As directed    Increase activity slowly   Complete by: As directed          Discharge Diagnoses: Principal Problem:   Acute on chronic systolic CHF (congestive heart failure) (HCC) Active Problems:   Myocardial injury   COPD (chronic obstructive pulmonary disease) (HCC)   HTN (hypertension), malignant   Chronic a-fib (HCC)   Essential hypertension   Paroxysmal atrial fibrillation (HCC)   Type II diabetes mellitus with renal manifestations (HCC)   CKD (chronic kidney disease) stage 4, GFR 15-29 ml/min (HCC)   Overweight (BMI 25.0-29.9)   Do not resuscitate   CHF exacerbation [facility])     Hospital course / significant events:   HPI: Leslie Parker is a 89 y.o. female with medical history significant of sCHF  with EF 25-30%, SSS (s/p of PPM), A fib on Eliquis , HTN, HLD, DM, COPD, CKD-IV, osteopenia, ileus, who presents with SOB, LE edema, orthopnea.   Of note, recent admission for similar, multiple admissions for end-stage CHF EF 25-30% - 7 admissions since January 2025 for very similar issues. Pt has pt refused placement to SNF or ALF.   04/19: admitted to hospitalist for CHF  04/20: still very SOB, reviewed CXR - pleural effusion progressing from previous imaging, US  +/- thoracentesis ordered. D/w patient re: code status given severely reduced EF and multiple hospitalizations / progression - opt for DNR.  04/21: L thoracentesis yield 850 mL 04/22: breathing well, ambulating at baseline, pt asking for discharge home. Increased torsemide  dose on discharge and will need close cardiology/PCP f/u      Consultants:  none  Procedures/Surgeries: 11/11/23 L thoracentesis        ASSESSMENT & PLAN:   Acute on chronic systolic CHF (congestive heart failure) [facility]): Patient has SOB, 1+ leg edema, significantly elevated BNP> 4500, clinically consistent with CHF exacerbation.  2D echo on 08/09/2023 showed EF 25-30%.  Increased home torsemide   Daily weights Low salt diet Fluid restriction Close cardiology follow up   Pleural effusion No apparent infection, almost certainly d/t CHF Follow pleural labs  Myocardial injury likely demand ischemia. on Eliquis , not give aspirin  On imdur  allergic to statin  COPD (chronic obstructive pulmonary disease)  No wheezing, no exacerbation  Resume home medications   HTN (hypertension) Resume home medications    Chronic a-fib Heart rate 50-60s in ED Eliquis  Coreg , amodarone   CKD (chronic kidney disease) stage 4, GFR 15-29 ml/min  Renal function stable.  Baseline creatinine 2.56 on 11/07/2023.  Her creatinine is 2.24, BUN 65, GFR 21. Renal function has stayed baseline at high dose diuretics will continue w/ increased home torsemide   Monitor BMP  outpatient    Type II diabetes mellitus with renal manifestations Recent A1c 7.0.  Patient is taking glipizide  at home Resume home medications     Code Status: DNR see IPAL note from 11/10/23            Discharge Instructions  Allergies as of 11/12/2023       Reactions   Baclofen Other (See Comments)   Elevated Cr   Simvastatin Other (See Comments)   Pt denies Elevated LFTs with simva 40mg , stopped and resolved (see MD note 11/10/13)        Medication List     TAKE these medications    acetaminophen  325 MG tablet Commonly known as: TYLENOL  Take 2 tablets (650 mg total) by mouth every 6 (six) hours as needed for headache or mild pain (pain score 1-3). What changed: when to take this   albuterol  108 (90 Base) MCG/ACT inhaler Commonly known as: VENTOLIN  HFA Inhale 2 puffs into the lungs every 6 (six) hours as needed.   amiodarone  200 MG tablet Commonly known as: PACERONE  Take 1 tablet by mouth daily.   apixaban  2.5 MG Tabs tablet Commonly known as: ELIQUIS  Take 1 tablet (2.5 mg total) by mouth 2 (two) times daily.   ascorbic acid  250 MG Chew Commonly known as: VITAMIN C  Chew 250 mg by mouth daily.   B-12 2500 MCG Tabs Take 2,500 mcg by mouth daily.   carvedilol  6.25 MG tablet Commonly known as: COREG  Take 1 tablet (6.25 mg total) by mouth 2 (two) times daily with a meal.   glipiZIDE  2.5 MG 24 hr tablet Commonly known as: GLUCOTROL  XL Take 2.5 mg by mouth daily.   hydrALAZINE  50 MG tablet Commonly known as: APRESOLINE  Take 1 tablet (50 mg total) by mouth 3 (three) times daily. What changed:  when to take this reasons to take this   ipratropium-albuterol  0.5-2.5 (3) MG/3ML Soln Commonly known as: DUONEB Inhale 3 mLs into the lungs every 6 (six) hours as needed.   isosorbide  mononitrate 30 MG 24 hr tablet Commonly known as: IMDUR  Take 1 tablet (30 mg total) by mouth at bedtime.   Magnesium  Oxide -Mg Supplement 500 MG Caps Take 500 mg by  mouth daily.   multivitamin with minerals Tabs tablet Take 1 tablet by mouth daily. One-A-Day Women's Vitamin   torsemide  20 MG tablet Commonly known as: DEMADEX  Take 1 tablet (20 mg total) by mouth 2 (two) times daily. Increase to 1 tablet (20 mg total) by mouth THREE TIMES daily (total daily dose MAX 60 mg) as needed for up to 3 days for increased leg swelling, shortness of breath, weight gain 5+ lbs over 1-2 days. CALL YOUR CARDIOLOGIST IF YOU NEED TO TAKE EXTRA MEDICINE, THEY MAY WANT TO SEE YOU IN OFFICE OR REFER TO ER IF NEEDED What changed:  when to take this additional instructions   traZODone  100 MG tablet Commonly known as: DESYREL  Take 100 mg by mouth at bedtime.   Trelegy Ellipta 100-62.5-25 MCG/ACT Aepb Generic drug:  Fluticasone -Umeclidin-Vilant Inhale 1 puff into the lungs daily.   Vitamin D -1000 Max St 25 MCG (1000 UT) tablet Generic drug: Cholecalciferol  Take 1,000 Units by mouth daily.          Allergies  Allergen Reactions   Baclofen Other (See Comments)    Elevated Cr   Simvastatin Other (See Comments)    Pt denies  Elevated LFTs with simva 40mg , stopped and resolved (see MD note 11/10/13)     Subjective: pt reports feeling good today and asks about going home, ambulating about baseline, no SOB, no CP, no palpitations, tolerating diet    Discharge Exam: BP (!) 160/73 (BP Location: Left Arm)   Pulse (!) 59   Temp 98.6 F (37 C)   Resp 19   Ht 5\' 9"  (1.753 m)   Wt 70.9 kg   SpO2 99%   BMI 23.08 kg/m  General: Pt is alert, awake, not in acute distress Cardiovascular: RRR, S1/S2 +murmur Respiratory: stil some diminished breath sounds on R base but improved, no rales  Abdominal: Soft, NT, ND, bowel sounds + Extremities: trace lower extermity edema, no cyanosis     The results of significant diagnostics from this hospitalization (including imaging, microbiology, ancillary and laboratory) are listed below for reference.      Microbiology: Recent Results (from the past 240 hours)  Blood Culture (routine x 2)     Status: None   Collection Time: 11/05/23  9:17 AM   Specimen: BLOOD RIGHT FOREARM  Result Value Ref Range Status   Specimen Description BLOOD RIGHT FOREARM  Final   Special Requests   Final    BOTTLES DRAWN AEROBIC AND ANAEROBIC Blood Culture adequate volume   Culture   Final    NO GROWTH 5 DAYS Performed at [facility], [address] [place], [place] [address]    Report Status 11/10/2023 FINAL  Final  Blood Culture (routine x 2)     Status: None   Collection Time: 11/05/23  9:27 AM   Specimen: BLOOD  Result Value Ref Range Status   Specimen Description BLOOD RIGHT ANTECUBITAL  Final   Special Requests   Final    BOTTLES DRAWN AEROBIC AND ANAEROBIC Blood Culture adequate volume   Culture   Final    NO GROWTH 5 DAYS Performed at [facility], [address] [place], [place] [address]    Report Status 11/10/2023 FINAL  Final  Resp panel by RT-PCR (RSV, Flu A&B, Covid) Anterior Nasal Swab     Status: None   Collection Time: 11/05/23  9:27 AM   Specimen: Anterior Nasal Swab  Result Value Ref Range Status   SARS Coronavirus 2 by RT PCR NEGATIVE NEGATIVE Final    Comment: (NOTE) SARS-CoV-2 target nucleic acids are NOT DETECTED.  The SARS-CoV-2 RNA is generally detectable in upper respiratory specimens during the acute phase of infection. The lowest concentration of SARS-CoV-2 viral copies this assay can detect is 138 copies/mL. A negative result does not preclude SARS-Cov-2 infection and should not be used as the sole basis for treatment or other patient management decisions. A negative result may occur with  improper specimen collection/handling, submission of specimen other than nasopharyngeal swab, presence of viral mutation(s) within the areas targeted by this assay, and inadequate number of viral copies(<138 copies/mL). A negative result must be combined  with clinical observations, patient history, and epidemiological information. The expected result is Negative.  Fact Sheet for Patients:  BloggerCourse.com  Fact Sheet for Healthcare Providers:  SeriousBroker.it  This test is no t yet approved or cleared by the [place]  FDA and  has been authorized for detection and/or diagnosis of SARS-CoV-2 by FDA under an Emergency Use Authorization (EUA). This EUA will remain  in effect (meaning this test can be used) for the duration of the COVID-19 declaration under Section 564(b)(1) of the Act, 21 U.S.C.section 360bbb-3(b)(1), unless the authorization is terminated  or revoked sooner.       Influenza A by PCR NEGATIVE NEGATIVE Final   Influenza B by PCR NEGATIVE NEGATIVE Final    Comment: (NOTE) The Xpert Xpress SARS-CoV-2/FLU/RSV plus assay is intended as an aid in the diagnosis of influenza from Nasopharyngeal swab specimens and should not be used as a sole basis for treatment. Nasal washings and aspirates are unacceptable for Xpert Xpress SARS-CoV-2/FLU/RSV testing.  Fact Sheet for Patients: BloggerCourse.com  Fact Sheet for Healthcare Providers: SeriousBroker.it  This test is not yet approved or cleared by the [place]  FDA and has been authorized for detection and/or diagnosis of SARS-CoV-2 by FDA under an Emergency Use Authorization (EUA). This EUA will remain in effect (meaning this test can be used) for the duration of the COVID-19 declaration under Section 564(b)(1) of the Act, 21 U.S.C. section 360bbb-3(b)(1), unless the authorization is terminated or revoked.     Resp Syncytial Virus by PCR NEGATIVE NEGATIVE Final    Comment: (NOTE) Fact Sheet for Patients: BloggerCourse.com  Fact Sheet for Healthcare Providers: SeriousBroker.it  This test is not yet approved  or cleared by the [place]  FDA and has been authorized for detection and/or diagnosis of SARS-CoV-2 by FDA under an Emergency Use Authorization (EUA). This EUA will remain in effect (meaning this test can be used) for the duration of the COVID-19 declaration under Section 564(b)(1) of the Act, 21 U.S.C. section 360bbb-3(b)(1), unless the authorization is terminated or revoked.  Performed at [facility], [address] [place], [place] [address]   Resp panel by RT-PCR (RSV, Flu A&B, Covid) Anterior Nasal Swab     Status: None   Collection Time: 11/09/23  3:39 PM   Specimen: Anterior Nasal Swab  Result Value Ref Range Status   SARS Coronavirus 2 by RT PCR NEGATIVE NEGATIVE Final    Comment: (NOTE) SARS-CoV-2 target nucleic acids are NOT DETECTED.  The SARS-CoV-2 RNA is generally detectable in upper respiratory specimens during the acute phase of infection. The lowest concentration of SARS-CoV-2 viral copies this assay can detect is 138 copies/mL. A negative result does not preclude SARS-Cov-2 infection and should not be used as the sole basis for treatment or other patient management decisions. A negative result may occur with  improper specimen collection/handling, submission of specimen other than nasopharyngeal swab, presence of viral mutation(s) within the areas targeted by this assay, and inadequate number of viral copies(<138 copies/mL). A negative result must be combined with clinical observations, patient history, and epidemiological information. The expected result is Negative.  Fact Sheet for Patients:  BloggerCourse.com  Fact Sheet for Healthcare Providers:  SeriousBroker.it  This test is no t yet approved or cleared by the [place]  FDA and  has been authorized for detection and/or diagnosis of SARS-CoV-2 by FDA under an Emergency Use Authorization (EUA). This EUA will remain  in effect (meaning  this test can be used) for the duration of the COVID-19 declaration under Section 564(b)(1) of the Act, 21 U.S.C.section 360bbb-3(b)(1), unless the authorization is terminated  or revoked sooner.       Influenza  A by PCR NEGATIVE NEGATIVE Final   Influenza B by PCR NEGATIVE NEGATIVE Final    Comment: (NOTE) The Xpert Xpress SARS-CoV-2/FLU/RSV plus assay is intended as an aid in the diagnosis of influenza from Nasopharyngeal swab specimens and should not be used as a sole basis for treatment. Nasal washings and aspirates are unacceptable for Xpert Xpress SARS-CoV-2/FLU/RSV testing.  Fact Sheet for Patients: BloggerCourse.com  Fact Sheet for Healthcare Providers: SeriousBroker.it  This test is not yet approved or cleared by the [place]  FDA and has been authorized for detection and/or diagnosis of SARS-CoV-2 by FDA under an Emergency Use Authorization (EUA). This EUA will remain in effect (meaning this test can be used) for the duration of the COVID-19 declaration under Section 564(b)(1) of the Act, 21 U.S.C. section 360bbb-3(b)(1), unless the authorization is terminated or revoked.     Resp Syncytial Virus by PCR NEGATIVE NEGATIVE Final    Comment: (NOTE) Fact Sheet for Patients: BloggerCourse.com  Fact Sheet for Healthcare Providers: SeriousBroker.it  This test is not yet approved or cleared by the [place]  FDA and has been authorized for detection and/or diagnosis of SARS-CoV-2 by FDA under an Emergency Use Authorization (EUA). This EUA will remain in effect (meaning this test can be used) for the duration of the COVID-19 declaration under Section 564(b)(1) of the Act, 21 U.S.C. section 360bbb-3(b)(1), unless the authorization is terminated or revoked.  Performed at [facility], [address] [place], [place] [address]   Body fluid culture w Gram  Stain     Status: None (Preliminary result)   Collection Time: 11/11/23 11:40 AM   Specimen: PATH Cytology Pleural fluid  Result Value Ref Range Status   Specimen Description   Final    FLUID Performed at [facility], [address], [place], [place] [address]    Special Requests   Final    CYTO PLEU Performed at [facility], [address] [place], [place] [address]    Gram Stain NO WBC SEEN NO ORGANISMS SEEN   Final   Culture   Final    NO GROWTH < 24 HOURS Performed at [facility], [address] [place], [place] [address]    Report Status PENDING  Incomplete     Labs: BNP (last 3 results) Recent Labs    10/22/23 0857 11/05/23 0927 11/09/23 1539  BNP >4,500.0* >4,500.0* >4,500.0*   Basic Metabolic Panel: Recent Labs  Lab 11/07/23 0839 11/09/23 1539 11/10/23 0350 11/11/23 0539 11/12/23 0554  NA 138 138 136 141 141  K 4.2 3.6 3.3* 3.5 3.9  CL 103 101 104 104 103  CO2 25 25 25 28 28   GLUCOSE 138* 111* 86 118* 107*  BUN 71* 65* 60* 58* 57*  CREATININE 2.56* 2.24* 2.18* 2.06* 1.99*  CALCIUM 8.3* 8.3* 7.9* 8.1* 8.3*  MG 2.1  --  1.6*  --   --    Liver Function Tests: No results for input(s): "AST", "ALT", "ALKPHOS", "BILITOT", "PROT", "ALBUMIN " in the last 168 hours. No results for input(s): "LIPASE", "AMYLASE" in the last 168 hours. No results for input(s): "AMMONIA" in the last 168 hours. CBC: Recent Labs  Lab 11/06/23 0414 11/09/23 1539 11/10/23 0350 11/12/23 0554  WBC 5.3 5.9 5.0 5.0  HGB 10.3* 10.9* 9.7* 9.9*  HCT 30.3* 32.9* 29.5* 29.9*  MCV 92.4 93.5 95.5 94.6  PLT 268 285 243 218   Cardiac Enzymes: No results for input(s): "CKTOTAL", "CKMB", "CKMBINDEX", "TROPONINI" in the last 168  hours. BNP: Invalid input(s): "POCBNP" CBG: Recent Labs  Lab 11/11/23 1221 11/11/23 1603 11/11/23 2137 11/12/23 0832 11/12/23 1236  GLUCAP 130* 184* 90 116* 144*   D-Dimer No results for input(s): "DDIMER" in the last 72  hours. Hgb A1c No results for input(s): "HGBA1C" in the last 72 hours. Lipid Profile No results for input(s): "CHOL", "HDL", "LDLCALC", "TRIG", "CHOLHDL", "LDLDIRECT" in the last 72 hours. Thyroid  function studies No results for input(s): "TSH", "T4TOTAL", "T3FREE", "THYROIDAB" in the last 72 hours.  Invalid input(s): "FREET3" Anemia work up No results for input(s): "VITAMINB12", "FOLATE", "FERRITIN", "TIBC", "IRON", "RETICCTPCT" in the last 72 hours. Urinalysis    Component Value Date/Time   COLORURINE YELLOW (A) 11/05/2023 1505   APPEARANCEUR CLEAR (A) 11/05/2023 1505   APPEARANCEUR Clear 02/19/2013 0254   LABSPEC 1.012 11/05/2023 1505   LABSPEC 1.006 02/19/2013 0254   PHURINE 6.0 11/05/2023 1505   GLUCOSEU NEGATIVE 11/05/2023 1505   GLUCOSEU Negative 02/19/2013 0254   HGBUR NEGATIVE 11/05/2023 1505   BILIRUBINUR NEGATIVE 11/05/2023 1505   BILIRUBINUR Negative 02/19/2013 0254   KETONESUR NEGATIVE 11/05/2023 1505   PROTEINUR 100 (A) 11/05/2023 1505   NITRITE NEGATIVE 11/05/2023 1505   LEUKOCYTESUR NEGATIVE 11/05/2023 1505   LEUKOCYTESUR 1+ 02/19/2013 0254   Sepsis Labs Recent Labs  Lab 11/06/23 0414 11/09/23 1539 11/10/23 0350 11/12/23 0554  WBC 5.3 5.9 5.0 5.0   Microbiology Recent Results (from the past 240 hours)  Blood Culture (routine x 2)     Status: None   Collection Time: 11/05/23  9:17 AM   Specimen: BLOOD RIGHT FOREARM  Result Value Ref Range Status   Specimen Description BLOOD RIGHT FOREARM  Final   Special Requests   Final    BOTTLES DRAWN AEROBIC AND ANAEROBIC Blood Culture adequate volume   Culture   Final    NO GROWTH 5 DAYS Performed at [facility], [address], [place], [place] [address]    Report Status 11/10/2023 FINAL  Final  Blood Culture (routine x 2)     Status: None   Collection Time: 11/05/23  9:27 AM   Specimen: BLOOD  Result Value Ref Range Status   Specimen Description BLOOD RIGHT ANTECUBITAL  Final   Special  Requests   Final    BOTTLES DRAWN AEROBIC AND ANAEROBIC Blood Culture adequate volume   Culture   Final    NO GROWTH 5 DAYS Performed at [facility], [address] [place], [place] [address]    Report Status 11/10/2023 FINAL  Final  Resp panel by RT-PCR (RSV, Flu A&B, Covid) Anterior Nasal Swab     Status: None   Collection Time: 11/05/23  9:27 AM   Specimen: Anterior Nasal Swab  Result Value Ref Range Status   SARS Coronavirus 2 by RT PCR NEGATIVE NEGATIVE Final    Comment: (NOTE) SARS-CoV-2 target nucleic acids are NOT DETECTED.  The SARS-CoV-2 RNA is generally detectable in upper respiratory specimens during the acute phase of infection. The lowest concentration of SARS-CoV-2 viral copies this assay can detect is 138 copies/mL. A negative result does not preclude SARS-Cov-2 infection and should not be used as the sole basis for treatment or other patient management decisions. A negative result may occur with  improper specimen collection/handling, submission of specimen other than nasopharyngeal swab, presence of viral mutation(s) within the areas targeted by this assay, and inadequate number of viral copies(<138 copies/mL). A negative result must be combined with clinical observations, patient history, and epidemiological information. The expected  result is Negative.  Fact Sheet for Patients:  BloggerCourse.com  Fact Sheet for Healthcare Providers:  SeriousBroker.it  This test is no t yet approved or cleared by the [place]  FDA and  has been authorized for detection and/or diagnosis of SARS-CoV-2 by FDA under an Emergency Use Authorization (EUA). This EUA will remain  in effect (meaning this test can be used) for the duration of the COVID-19 declaration under Section 564(b)(1) of the Act, 21 U.S.C.section 360bbb-3(b)(1), unless the authorization is terminated  or revoked sooner.       Influenza A by PCR  NEGATIVE NEGATIVE Final   Influenza B by PCR NEGATIVE NEGATIVE Final    Comment: (NOTE) The Xpert Xpress SARS-CoV-2/FLU/RSV plus assay is intended as an aid in the diagnosis of influenza from Nasopharyngeal swab specimens and should not be used as a sole basis for treatment. Nasal washings and aspirates are unacceptable for Xpert Xpress SARS-CoV-2/FLU/RSV testing.  Fact Sheet for Patients: BloggerCourse.com  Fact Sheet for Healthcare Providers: SeriousBroker.it  This test is not yet approved or cleared by the [place]  FDA and has been authorized for detection and/or diagnosis of SARS-CoV-2 by FDA under an Emergency Use Authorization (EUA). This EUA will remain in effect (meaning this test can be used) for the duration of the COVID-19 declaration under Section 564(b)(1) of the Act, 21 U.S.C. section 360bbb-3(b)(1), unless the authorization is terminated or revoked.     Resp Syncytial Virus by PCR NEGATIVE NEGATIVE Final    Comment: (NOTE) Fact Sheet for Patients: BloggerCourse.com  Fact Sheet for Healthcare Providers: SeriousBroker.it  This test is not yet approved or cleared by the [place]  FDA and has been authorized for detection and/or diagnosis of SARS-CoV-2 by FDA under an Emergency Use Authorization (EUA). This EUA will remain in effect (meaning this test can be used) for the duration of the COVID-19 declaration under Section 564(b)(1) of the Act, 21 U.S.C. section 360bbb-3(b)(1), unless the authorization is terminated or revoked.  Performed at [facility], [address] [place], [place] [address]   Resp panel by RT-PCR (RSV, Flu A&B, Covid) Anterior Nasal Swab     Status: None   Collection Time: 11/09/23  3:39 PM   Specimen: Anterior Nasal Swab  Result Value Ref Range Status   SARS Coronavirus 2 by RT PCR NEGATIVE NEGATIVE Final    Comment:  (NOTE) SARS-CoV-2 target nucleic acids are NOT DETECTED.  The SARS-CoV-2 RNA is generally detectable in upper respiratory specimens during the acute phase of infection. The lowest concentration of SARS-CoV-2 viral copies this assay can detect is 138 copies/mL. A negative result does not preclude SARS-Cov-2 infection and should not be used as the sole basis for treatment or other patient management decisions. A negative result may occur with  improper specimen collection/handling, submission of specimen other than nasopharyngeal swab, presence of viral mutation(s) within the areas targeted by this assay, and inadequate number of viral copies(<138 copies/mL). A negative result must be combined with clinical observations, patient history, and epidemiological information. The expected result is Negative.  Fact Sheet for Patients:  BloggerCourse.com  Fact Sheet for Healthcare Providers:  SeriousBroker.it  This test is no t yet approved or cleared by the [place]  FDA and  has been authorized for detection and/or diagnosis of SARS-CoV-2 by FDA under an Emergency Use Authorization (EUA). This EUA will remain  in effect (meaning this test can be used) for the duration of the COVID-19 declaration under Section 564(b)(1) of the Act, 21 U.S.C.section  360bbb-3(b)(1), unless the authorization is terminated  or revoked sooner.       Influenza A by PCR NEGATIVE NEGATIVE Final   Influenza B by PCR NEGATIVE NEGATIVE Final    Comment: (NOTE) The Xpert Xpress SARS-CoV-2/FLU/RSV plus assay is intended as an aid in the diagnosis of influenza from Nasopharyngeal swab specimens and should not be used as a sole basis for treatment. Nasal washings and aspirates are unacceptable for Xpert Xpress SARS-CoV-2/FLU/RSV testing.  Fact Sheet for Patients: BloggerCourse.com  Fact Sheet for Healthcare  Providers: SeriousBroker.it  This test is not yet approved or cleared by the [place]  FDA and has been authorized for detection and/or diagnosis of SARS-CoV-2 by FDA under an Emergency Use Authorization (EUA). This EUA will remain in effect (meaning this test can be used) for the duration of the COVID-19 declaration under Section 564(b)(1) of the Act, 21 U.S.C. section 360bbb-3(b)(1), unless the authorization is terminated or revoked.     Resp Syncytial Virus by PCR NEGATIVE NEGATIVE Final    Comment: (NOTE) Fact Sheet for Patients: BloggerCourse.com  Fact Sheet for Healthcare Providers: SeriousBroker.it  This test is not yet approved or cleared by the [place]  FDA and has been authorized for detection and/or diagnosis of SARS-CoV-2 by FDA under an Emergency Use Authorization (EUA). This EUA will remain in effect (meaning this test can be used) for the duration of the COVID-19 declaration under Section 564(b)(1) of the Act, 21 U.S.C. section 360bbb-3(b)(1), unless the authorization is terminated or revoked.  Performed at [facility], [address] [place], [place] [address]   Body fluid culture w Gram Stain     Status: None (Preliminary result)   Collection Time: 11/11/23 11:40 AM   Specimen: PATH Cytology Pleural fluid  Result Value Ref Range Status   Specimen Description   Final    FLUID Performed at [facility], [address], [place], [place] [address]    Special Requests   Final    CYTO PLEU Performed at [facility], [address] [place], [place] [address]    Gram Stain NO WBC SEEN NO ORGANISMS SEEN   Final   Culture   Final    NO GROWTH < 24 HOURS Performed at [facility], [address], [place], [place] [address]    Report Status PENDING  Incomplete   Imaging DG Chest 2 View Result Date: 11/09/2023 CLINICAL DATA:  Ongoing  shortness of breath and difficulty sleeping. EXAM: CHEST - 2 VIEW COMPARISON:  11/05/2023. FINDINGS: The heart is enlarged and the mediastinal contour is within normal limits. There is atherosclerotic calcification of the aorta. Chronic interstitial prominence is noted bilaterally. There are small bilateral pleural effusions with atelectasis or infiltrate at the lung bases. No pneumothorax is seen. A dual lead pacemaker device is present over the left chest. Right shoulder arthroplasty changes are noted. There is evidence of prior rotator cuff repair at the left shoulder. IMPRESSION: 1. Atelectasis or infiltrate at the lung bases. 2. Small bilateral pleural effusions. 3. Aortic atherosclerosis. Electronically Signed   By: Wyvonnia Heimlich M.D.   On: 11/09/2023 18:01      Time coordinating discharge: over 30 minutes  SIGNED:  Rana Hochstein DO [facility]

## 2023-11-13 ENCOUNTER — Other Ambulatory Visit: Payer: Self-pay

## 2023-11-13 LAB — PATHOLOGIST SMEAR REVIEW

## 2023-11-14 LAB — BODY FLUID CULTURE W GRAM STAIN: Gram Stain: NONE SEEN

## 2023-11-14 LAB — CHOLESTEROL, BODY FLUID: Cholesterol, Fluid: 26 mg/dL

## 2023-11-15 ENCOUNTER — Emergency Department

## 2023-11-15 ENCOUNTER — Other Ambulatory Visit: Payer: Self-pay

## 2023-11-15 ENCOUNTER — Inpatient Hospital Stay
Admission: EM | Admit: 2023-11-15 | Discharge: 2023-11-17 | DRG: 291 | Disposition: A | Discharge status: Hospice | Attending: Internal Medicine | Admitting: Internal Medicine

## 2023-11-15 DIAGNOSIS — N179 Acute kidney failure, unspecified: Secondary | ICD-10-CM | POA: Diagnosis not present

## 2023-11-15 DIAGNOSIS — I110 Hypertensive heart disease with heart failure: Secondary | ICD-10-CM | POA: Diagnosis not present

## 2023-11-15 DIAGNOSIS — I10 Essential (primary) hypertension: Secondary | ICD-10-CM | POA: Diagnosis not present

## 2023-11-15 DIAGNOSIS — R918 Other nonspecific abnormal finding of lung field: Secondary | ICD-10-CM | POA: Diagnosis not present

## 2023-11-15 DIAGNOSIS — I4891 Unspecified atrial fibrillation: Secondary | ICD-10-CM

## 2023-11-15 DIAGNOSIS — Z961 Presence of intraocular lens: Secondary | ICD-10-CM | POA: Diagnosis present

## 2023-11-15 DIAGNOSIS — N184 Chronic kidney disease, stage 4 (severe): Secondary | ICD-10-CM | POA: Diagnosis present

## 2023-11-15 DIAGNOSIS — M8580 Other specified disorders of bone density and structure, unspecified site: Secondary | ICD-10-CM | POA: Diagnosis present

## 2023-11-15 DIAGNOSIS — J449 Chronic obstructive pulmonary disease, unspecified: Secondary | ICD-10-CM

## 2023-11-15 DIAGNOSIS — Z90710 Acquired absence of both cervix and uterus: Secondary | ICD-10-CM | POA: Diagnosis not present

## 2023-11-15 DIAGNOSIS — J9601 Acute respiratory failure with hypoxia: Secondary | ICD-10-CM | POA: Diagnosis present

## 2023-11-15 DIAGNOSIS — J45909 Unspecified asthma, uncomplicated: Secondary | ICD-10-CM | POA: Diagnosis present

## 2023-11-15 DIAGNOSIS — E785 Hyperlipidemia, unspecified: Secondary | ICD-10-CM | POA: Diagnosis present

## 2023-11-15 DIAGNOSIS — R011 Cardiac murmur, unspecified: Secondary | ICD-10-CM | POA: Diagnosis present

## 2023-11-15 DIAGNOSIS — I5023 Acute on chronic systolic (congestive) heart failure: Secondary | ICD-10-CM | POA: Diagnosis present

## 2023-11-15 DIAGNOSIS — I495 Sick sinus syndrome: Secondary | ICD-10-CM | POA: Diagnosis present

## 2023-11-15 DIAGNOSIS — I480 Paroxysmal atrial fibrillation: Secondary | ICD-10-CM | POA: Diagnosis present

## 2023-11-15 DIAGNOSIS — I130 Hypertensive heart and chronic kidney disease with heart failure and stage 1 through stage 4 chronic kidney disease, or unspecified chronic kidney disease: Principal | ICD-10-CM | POA: Diagnosis present

## 2023-11-15 DIAGNOSIS — Z888 Allergy status to other drugs, medicaments and biological substances status: Secondary | ICD-10-CM

## 2023-11-15 DIAGNOSIS — Z79899 Other long term (current) drug therapy: Secondary | ICD-10-CM | POA: Diagnosis not present

## 2023-11-15 DIAGNOSIS — Z66 Do not resuscitate: Secondary | ICD-10-CM | POA: Diagnosis not present

## 2023-11-15 DIAGNOSIS — E1122 Type 2 diabetes mellitus with diabetic chronic kidney disease: Secondary | ICD-10-CM | POA: Diagnosis present

## 2023-11-15 DIAGNOSIS — Z515 Encounter for palliative care: Secondary | ICD-10-CM | POA: Diagnosis not present

## 2023-11-15 DIAGNOSIS — J90 Pleural effusion, not elsewhere classified: Secondary | ICD-10-CM | POA: Diagnosis not present

## 2023-11-15 DIAGNOSIS — R0600 Dyspnea, unspecified: Secondary | ICD-10-CM | POA: Diagnosis not present

## 2023-11-15 DIAGNOSIS — Z7951 Long term (current) use of inhaled steroids: Secondary | ICD-10-CM

## 2023-11-15 DIAGNOSIS — Z7984 Long term (current) use of oral hypoglycemic drugs: Secondary | ICD-10-CM

## 2023-11-15 DIAGNOSIS — Z8249 Family history of ischemic heart disease and other diseases of the circulatory system: Secondary | ICD-10-CM

## 2023-11-15 DIAGNOSIS — J4489 Other specified chronic obstructive pulmonary disease: Secondary | ICD-10-CM | POA: Diagnosis not present

## 2023-11-15 DIAGNOSIS — Z96611 Presence of right artificial shoulder joint: Secondary | ICD-10-CM | POA: Diagnosis not present

## 2023-11-15 DIAGNOSIS — M1990 Unspecified osteoarthritis, unspecified site: Secondary | ICD-10-CM | POA: Diagnosis present

## 2023-11-15 DIAGNOSIS — Z9049 Acquired absence of other specified parts of digestive tract: Secondary | ICD-10-CM

## 2023-11-15 DIAGNOSIS — E1129 Type 2 diabetes mellitus with other diabetic kidney complication: Secondary | ICD-10-CM | POA: Diagnosis not present

## 2023-11-15 DIAGNOSIS — R0602 Shortness of breath: Secondary | ICD-10-CM | POA: Diagnosis present

## 2023-11-15 DIAGNOSIS — Z9842 Cataract extraction status, left eye: Secondary | ICD-10-CM

## 2023-11-15 DIAGNOSIS — I509 Heart failure, unspecified: Secondary | ICD-10-CM | POA: Diagnosis not present

## 2023-11-15 DIAGNOSIS — Z825 Family history of asthma and other chronic lower respiratory diseases: Secondary | ICD-10-CM | POA: Diagnosis not present

## 2023-11-15 DIAGNOSIS — Z9841 Cataract extraction status, right eye: Secondary | ICD-10-CM

## 2023-11-15 DIAGNOSIS — Z950 Presence of cardiac pacemaker: Secondary | ICD-10-CM | POA: Diagnosis not present

## 2023-11-15 DIAGNOSIS — Z96653 Presence of artificial knee joint, bilateral: Secondary | ICD-10-CM | POA: Diagnosis present

## 2023-11-15 DIAGNOSIS — R069 Unspecified abnormalities of breathing: Secondary | ICD-10-CM | POA: Diagnosis not present

## 2023-11-15 DIAGNOSIS — Z7901 Long term (current) use of anticoagulants: Secondary | ICD-10-CM

## 2023-11-15 DIAGNOSIS — R001 Bradycardia, unspecified: Secondary | ICD-10-CM | POA: Diagnosis not present

## 2023-11-15 LAB — CBC WITH DIFFERENTIAL/PLATELET
Abs Immature Granulocytes: 0.03 10*3/uL (ref 0.00–0.07)
Basophils Absolute: 0 10*3/uL (ref 0.0–0.1)
Basophils Relative: 1 %
Eosinophils Absolute: 0 10*3/uL (ref 0.0–0.5)
Eosinophils Relative: 1 %
HCT: 34.9 % — ABNORMAL LOW (ref 36.0–46.0)
Hemoglobin: 11.2 g/dL — ABNORMAL LOW (ref 12.0–15.0)
Immature Granulocytes: 1 %
Lymphocytes Relative: 18 %
Lymphs Abs: 1.2 10*3/uL (ref 0.7–4.0)
MCH: 31 pg (ref 26.0–34.0)
MCHC: 32.1 g/dL (ref 30.0–36.0)
MCV: 96.7 fL (ref 80.0–100.0)
Monocytes Absolute: 0.5 10*3/uL (ref 0.1–1.0)
Monocytes Relative: 7 %
Neutro Abs: 4.7 10*3/uL (ref 1.7–7.7)
Neutrophils Relative %: 72 %
Platelets: 213 10*3/uL (ref 150–400)
RBC: 3.61 MIL/uL — ABNORMAL LOW (ref 3.87–5.11)
RDW: 15.7 % — ABNORMAL HIGH (ref 11.5–15.5)
WBC: 6.4 10*3/uL (ref 4.0–10.5)
nRBC: 0 % (ref 0.0–0.2)

## 2023-11-15 LAB — COMPREHENSIVE METABOLIC PANEL WITH GFR
ALT: 28 U/L (ref 0–44)
AST: 41 U/L (ref 15–41)
Albumin: 3 g/dL — ABNORMAL LOW (ref 3.5–5.0)
Alkaline Phosphatase: 81 U/L (ref 38–126)
Anion gap: 7 (ref 5–15)
BUN: 61 mg/dL — ABNORMAL HIGH (ref 8–23)
CO2: 26 mmol/L (ref 22–32)
Calcium: 8.4 mg/dL — ABNORMAL LOW (ref 8.9–10.3)
Chloride: 105 mmol/L (ref 98–111)
Creatinine, Ser: 2.42 mg/dL — ABNORMAL HIGH (ref 0.44–1.00)
GFR, Estimated: 19 mL/min — ABNORMAL LOW (ref >60)
Glucose, Bld: 125 mg/dL — ABNORMAL HIGH (ref 70–99)
Potassium: 4.4 mmol/L (ref 3.5–5.1)
Sodium: 138 mmol/L (ref 135–145)
Total Bilirubin: 1 mg/dL (ref 0.0–1.2)
Total Protein: 6.1 g/dL — ABNORMAL LOW (ref 6.5–8.1)

## 2023-11-15 LAB — PROCALCITONIN: Procalcitonin: <0.1 ng/mL

## 2023-11-15 LAB — TROPONIN I (HIGH SENSITIVITY): Troponin I (High Sensitivity): 35 ng/L — ABNORMAL HIGH (ref <18)

## 2023-11-15 LAB — MAGNESIUM: Magnesium: 2 mg/dL (ref 1.7–2.4)

## 2023-11-15 LAB — BRAIN NATRIURETIC PEPTIDE: B Natriuretic Peptide: >4500 pg/mL — ABNORMAL HIGH (ref 0.0–100.0)

## 2023-11-15 MED ORDER — SODIUM CHLORIDE 0.9 % IV SOLN: 250.0000 mL | INTRAVENOUS | Status: AC | PRN

## 2023-11-15 MED ORDER — ACETAMINOPHEN 500 MG PO TABS
1000.0000 mg | ORAL_TABLET | Freq: Four times a day (QID) | ORAL | Status: DC | PRN
  Administered 2023-11-15: 1000 mg via ORAL
  Filled 2023-11-15: qty 2

## 2023-11-15 MED ORDER — SODIUM CHLORIDE 0.9% FLUSH: 3.0000 mL | INTRAVENOUS | Status: DC | PRN

## 2023-11-15 MED ORDER — VITAMIN C 500 MG PO TABS
250.0000 mg | ORAL_TABLET | Freq: Every day | ORAL | Status: DC
  Administered 2023-11-15 – 2023-11-17 (×3): 250 mg via ORAL
  Filled 2023-11-15 (×3): qty 1

## 2023-11-15 MED ORDER — ADULT MULTIVITAMIN W/MINERALS CH
1.0000 | ORAL_TABLET | Freq: Every day | ORAL | Status: DC
  Administered 2023-11-15 – 2023-11-17 (×3): 1 via ORAL
  Filled 2023-11-15 (×3): qty 1

## 2023-11-15 MED ORDER — ENOXAPARIN SODIUM 40 MG/0.4ML IJ SOSY: 40.0000 mg | PREFILLED_SYRINGE | INTRAMUSCULAR | Status: DC

## 2023-11-15 MED ORDER — APIXABAN 2.5 MG PO TABS
2.5000 mg | ORAL_TABLET | Freq: Two times a day (BID) | ORAL | Status: DC
  Administered 2023-11-15 – 2023-11-17 (×4): 2.5 mg via ORAL
  Filled 2023-11-15 (×4): qty 1

## 2023-11-15 MED ORDER — SODIUM CHLORIDE 0.9% FLUSH
3.0000 mL | Freq: Two times a day (BID) | INTRAVENOUS | Status: DC
  Administered 2023-11-15 – 2023-11-17 (×5): 3 mL via INTRAVENOUS

## 2023-11-15 MED ORDER — BUDESON-GLYCOPYRROL-FORMOTEROL 160-9-4.8 MCG/ACT IN AERO
2.0000 | INHALATION_SPRAY | Freq: Two times a day (BID) | RESPIRATORY_TRACT | Status: DC
  Administered 2023-11-15: 2 via RESPIRATORY_TRACT
  Filled 2023-11-15: qty 5.9

## 2023-11-15 MED ORDER — FUROSEMIDE 10 MG/ML IJ SOLN
80.0000 mg | Freq: Two times a day (BID) | INTRAMUSCULAR | Status: DC
Start: 2023-11-15 — End: 2023-11-17
  Administered 2023-11-15: 80 mg via INTRAVENOUS
  Filled 2023-11-15: qty 8

## 2023-11-15 MED ORDER — CARVEDILOL 6.25 MG PO TABS
6.2500 mg | ORAL_TABLET | Freq: Two times a day (BID) | ORAL | Status: DC
  Administered 2023-11-16 – 2023-11-17 (×3): 6.25 mg via ORAL
  Filled 2023-11-15 (×3): qty 1

## 2023-11-15 MED ORDER — AMIODARONE HCL 200 MG PO TABS
200.0000 mg | ORAL_TABLET | Freq: Every day | ORAL | Status: DC
  Administered 2023-11-15 – 2023-11-17 (×3): 200 mg via ORAL
  Filled 2023-11-15 (×3): qty 1

## 2023-11-15 MED ORDER — ISOSORBIDE MONONITRATE ER 30 MG PO TB24
30.0000 mg | ORAL_TABLET | Freq: Every day | ORAL | Status: DC
  Administered 2023-11-15: 30 mg via ORAL
  Filled 2023-11-15: qty 1

## 2023-11-15 MED ORDER — VITAMIN D 25 MCG (1000 UNIT) PO TABS
1000.0000 [IU] | ORAL_TABLET | Freq: Every day | ORAL | Status: DC
  Administered 2023-11-15 – 2023-11-17 (×3): 1000 [IU] via ORAL
  Filled 2023-11-15 (×3): qty 1

## 2023-11-15 MED ORDER — HEPARIN SODIUM (PORCINE) 5000 UNIT/ML IJ SOLN: 5000.0000 [IU] | Freq: Three times a day (TID) | INTRAMUSCULAR | Status: DC

## 2023-11-15 MED ORDER — HYDRALAZINE HCL 50 MG PO TABS
50.0000 mg | ORAL_TABLET | Freq: Three times a day (TID) | ORAL | Status: DC | PRN
  Administered 2023-11-15 (×2): 50 mg via ORAL
  Filled 2023-11-15 (×2): qty 1

## 2023-11-15 MED ORDER — ONDANSETRON HCL 4 MG/2ML IJ SOLN: 4.0000 mg | Freq: Four times a day (QID) | INTRAMUSCULAR | Status: DC | PRN

## 2023-11-15 MED ORDER — ALBUTEROL SULFATE (2.5 MG/3ML) 0.083% IN NEBU: 2.5000 mg | INHALATION_SOLUTION | Freq: Four times a day (QID) | RESPIRATORY_TRACT | Status: DC | PRN

## 2023-11-15 MED ORDER — FUROSEMIDE 10 MG/ML IJ SOLN
80.0000 mg | Freq: Once | INTRAMUSCULAR | Status: AC
  Administered 2023-11-15: 80 mg via INTRAVENOUS
  Filled 2023-11-15: qty 8

## 2023-11-15 MED ORDER — ENOXAPARIN SODIUM 30 MG/0.3ML IJ SOSY: 30.0000 mg | PREFILLED_SYRINGE | INTRAMUSCULAR | Status: DC

## 2023-11-15 MED ORDER — ONDANSETRON HCL 4 MG PO TABS: 4.0000 mg | ORAL_TABLET | Freq: Four times a day (QID) | ORAL | Status: DC | PRN

## 2023-11-15 MED ORDER — VITAMIN B-12 1000 MCG PO TABS
2500.0000 ug | ORAL_TABLET | Freq: Every day | ORAL | Status: DC
  Administered 2023-11-16 – 2023-11-17 (×2): 2500 ug via ORAL
  Filled 2023-11-15 (×2): qty 3

## 2023-11-15 NOTE — ED Triage Notes (Signed)
 Pt BIB by EMS for shortness of breath upon waking.  Pt recently inpatient at our facility for heart failure.  Pt is on 2L [place] with saturation of 99% at this time.

## 2023-11-15 NOTE — ED Notes (Signed)
 Daisey Dryer MD at bedside

## 2023-11-15 NOTE — ED Notes (Addendum)
 Pt provided with orange juice okay per Daisey Dryer MD. Pt tolerating well

## 2023-11-15 NOTE — Assessment & Plan Note (Addendum)
 Pulse ox 89% on room air.  Will check ambulation room air sats.

## 2023-11-15 NOTE — Progress Notes (Signed)
 PHARMACIST - PHYSICIAN COMMUNICATION  CONCERNING:  Enoxaparin  (Lovenox ) for DVT Prophylaxis    RECOMMENDATION: Patient was prescribed enoxaprin 40mg  q24 hours for VTE prophylaxis.   There were no vitals filed for this visit.  There is no height or weight on file to calculate BMI.  Estimated Creatinine Clearance: 16.8 mL/min (A) (by C-G formula based on SCr of 2.42 mg/dL (H)).   Patient is candidate for enoxaparin  30mg  every 24 hours based on CrCl <73ml/min or Weight <45kg  DESCRIPTION: Pharmacy has adjusted enoxaparin  dose per [facility] policy.  Patient is now receiving enoxaparin  30 mg every 24 hours    Alice Innocent, PharmD Clinical Pharmacist  11/15/2023 2:43 PM

## 2023-11-15 NOTE — Assessment & Plan Note (Addendum)
 Creatinine creatinine 2.45 with a GFR of 18.  Recommend following up with BMP and follow-up appointment.

## 2023-11-15 NOTE — Assessment & Plan Note (Addendum)
 Continue Coreg , eliquis , amiodarone .  This morning was paced at 68 bpm

## 2023-11-15 NOTE — Assessment & Plan Note (Addendum)
 Last ejection fraction 25 to 30%.  Case discussed yesterday with cardiology with her numerous hospitalizations.  Continue Entresto.  Continue torsemide  twice daily.  Patient also on Imdur  hydralazine  and Coreg .  Numerous hospitalizations.  Appreciate palliative care consultation.  Patient will go home with hospice.  Consider adding spironolactone and/or Farxiga  as outpatient but the patient has significant CKD.  Since Entresto was added yesterday I do not want to get these meds currently.  As needed Roxanol for air hunger.

## 2023-11-15 NOTE — Assessment & Plan Note (Addendum)
 Will place on sliding scale insulin .  Hemoglobin A1c 7 point

## 2023-11-15 NOTE — Assessment & Plan Note (Addendum)
No wheezing heard

## 2023-11-15 NOTE — H&P (Signed)
 History and Physical    Patient: Leslie Parker UJW:[id number] DOB: 1933-03-10 DOA: 11/15/2023 DOS: the patient was seen and examined on 11/15/2023 PCP: Rex Castor, MD  Patient coming from: Home  Chief Complaint:  Chief Complaint  Patient presents with   Shortness of Breath   HPI: Leslie Parker is a 89 y.o. female with medical history significant of chronic HFrEF  EF 25-30%, SSS (s/p of PPM), A fib on Eliquis , HTN, HLD, DM, COPD, CKD-IV, osteopenia presenting with acute respiratory failure with hypoxia, acute on chronic HFrEF.  Patient noted to have been admitted for similar issues April 19 through April 22.  Patient will roughly 8+ admissions since July 2025 for similar issues.  Has noted to have refused SNF as well as assisted living placement in the past.  During the April admission patient was diuresed.  Also had a left thoracentesis yielding around 850 cc.  Patient reports increased work of breathing over the past 1 to 2 days.  No fevers or chills.  No nausea or vomiting.  No chest pain.  Reports compliance with home diuretic regimen.  Patient denies any NSAID use or high salt intake.  Mild orthopnea.  Lower extremity swelling grossly stable. Presented to the ER afebrile, hemodynamically stable.  Satting well on room air.  White count 6.4, hemoglobin 11.2, platelets 213, creatinine 2.42, glucose 125 troponin in the 30s.  BNP greater than 4500.  Chest x-ray with pulmonary edema. Review of Systems: As mentioned in the history of present illness. All other systems reviewed and are negative. Past Medical History:  Diagnosis Date   Acute kidney injury superimposed on CKD (HCC) 11/24/2022   Asthma    Atrial fibrillation with RVR (HCC)    Cancer (HCC)    Basal Cell   CHF (congestive heart failure) (HCC)    Closed left hip fracture (HCC) 01/25/2017   Closed right hip fracture (HCC) 02/13/2023   Diabetes mellitus without complication (HCC)    Femur fracture, left (HCC) 02/03/2015   Heart  murmur    Hypertension    Impetigo    Osteoarthritis    Osteopenia    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   Rapid atrial fibrillation, new onset(HCC) 06/05/2022   Vomiting    can not due to surgery   Wears dentures    full upper and lower   Past Surgical History:  Procedure Laterality Date   ABDOMINAL HYSTERECTOMY     BACK SURGERY     BLADDER SURGERY     mesh   CARPAL TUNNEL RELEASE Bilateral    CATARACT EXTRACTION Right 2017   CATARACT EXTRACTION W/PHACO Left 02/06/2016   Procedure: CATARACT EXTRACTION PHACO AND INTRAOCULAR LENS PLACEMENT (IOC) left eye;  Surgeon: Billee Buddle, MD;  Location: [facility];  Service: Ophthalmology;  Laterality: Left;  DIABETIC LEFT Cannot arrive before 9:30   CERVICAL DISC SURGERY     CHOLECYSTECTOMY     EYE SURGERY Bilateral    Cataract Extraction with IOL   FEMUR IM NAIL Left 02/04/2015   Procedure: INTRAMEDULLARY (IM) NAIL FEMORAL;  Surgeon: Rande Bushy, MD;  Location: [facility];  Service: Orthopedics;  Laterality: Left;   HARDWARE REMOVAL Left 01/17/2017   Procedure: HARDWARE REMOVAL;  Surgeon: Rande Bushy, MD;  Location: [facility];  Service: Orthopedics;  Laterality: Left;   HERNIA REPAIR  2014   esophageal and gastric mesh. patient unable to throw up d/t mesh   HIP ARTHROPLASTY Left 01/26/2017   Procedure: ARTHROPLASTY BIPOLAR HIP (HEMIARTHROPLASTY) removal  hardware left hip;  Surgeon: Marlynn Singer, MD;  Location: [facility];  Service: Orthopedics;  Laterality: Left;   INTRAMEDULLARY (IM) NAIL INTERTROCHANTERIC Right 02/13/2023   Procedure: INTRAMEDULLARY (IM) NAIL INTERTROCHANTERIC;  Surgeon: Elner Hahn, MD;  Location: [facility];  Service: Orthopedics;  Laterality: Right;   JOINT REPLACEMENT Bilateral    knees   PACEMAKER IMPLANT N/A 11/15/2022   Procedure: PACEMAKER IMPLANT;  Surgeon: Percival Brace, MD;  Location: [facility];  Service: Cardiovascular;  Laterality: N/A;   PACEMAKER IMPLANT N/A  11/28/2022   Procedure: PACEMAKER IMPLANT;  Surgeon: Percival Brace, MD;  Location: [facility];  Service: Cardiovascular;  Laterality: N/A;  Lead reposition   REPLACEMENT TOTAL KNEE BILATERAL Bilateral [id number]   SHOULDER ARTHROSCOPY WITH OPEN ROTATOR CUFF REPAIR AND DISTAL CLAVICLE ACROMINECTOMY Left 10/25/2016   Procedure: SHOULDER ARTHROSCOPY WITH OPEN ROTATOR CUFF REPAIR AND DISTAL CLAVICLE ACROMINECTOMY;  Surgeon: Rande Bushy, MD;  Location: [facility];  Service: Orthopedics;  Laterality: Left;   THYROID  SURGERY     goiter removed   TOTAL HIP REVISION Left 12/02/2017   Procedure: TOTAL HIP REVISION;  Surgeon: Jerlyn Moons, MD;  Location: [facility];  Service: Orthopedics;  Laterality: Left;   TOTAL SHOULDER REPLACEMENT Right 2012   Social History:  reports that she has never smoked. She has never used smokeless tobacco. She reports that she does not drink alcohol and does not use drugs.  Allergies  Allergen Reactions   Baclofen Other (See Comments)    Elevated Cr   Simvastatin Other (See Comments)    Pt denies  Elevated LFTs with simva 40mg , stopped and resolved (see MD note 11/10/13)    Family History  Problem Relation Age of Onset   Heart failure Mother    Hypertension Mother    Emphysema Father     Prior to Admission medications   Medication Sig Start Date End Date Taking? Authorizing Provider  acetaminophen  (TYLENOL ) 325 MG tablet Take 2 tablets (650 mg total) by mouth every 6 (six) hours as needed for headache or mild pain (pain score 1-3). 11/12/23   Melodi Sprung, DO  albuterol  (VENTOLIN  HFA) 108 (90 Base) MCG/ACT inhaler Inhale 2 puffs into the lungs every 6 (six) hours as needed. 08/04/19   [provider]  amiodarone  (PACERONE ) 200 MG tablet Take 1 tablet by mouth daily. 08/26/23   [provider]  apixaban  (ELIQUIS ) 2.5 MG TABS tablet Take 1 tablet (2.5 mg total) by mouth 2 (two) times daily. 06/07/22   Tiajuana Fluke, MD   ascorbic acid  (VITAMIN C ) 250 MG CHEW Chew 250 mg by mouth daily.    [provider]  carvedilol  (COREG ) 6.25 MG tablet Take 1 tablet (6.25 mg total) by mouth 2 (two) times daily with a meal. 08/11/23 11/09/23  Unk Garb, DO  Cholecalciferol  (VITAMIN D -1000 MAX ST) 25 MCG (1000 UT) tablet Take 1,000 Units by mouth daily.    [provider]  Cyanocobalamin  (B-12) 2500 MCG TABS Take 2,500 mcg by mouth daily.    [provider]  glipiZIDE  (GLUCOTROL  XL) 2.5 MG 24 hr tablet Take 2.5 mg by mouth daily.    [provider]  hydrALAZINE  (APRESOLINE ) 50 MG tablet Take 1 tablet (50 mg total) by mouth 3 (three) times daily. Patient taking differently: Take 50 mg by mouth 3 (three) times daily as needed. 08/11/23 11/09/23  Unk Garb, DO  ipratropium-albuterol  (DUONEB) 0.5-2.5 (3) MG/3ML SOLN Inhale 3 mLs into the lungs every 6 (six)  hours as needed. 06/01/22   [provider]  isosorbide  mononitrate (IMDUR ) 30 MG 24 hr tablet Take 1 tablet (30 mg total) by mouth at bedtime. 08/11/23 11/09/23  Unk Garb, DO  Magnesium  Oxide 500 MG CAPS Take 500 mg by mouth daily.    [provider]  Multiple Vitamin (MULTIVITAMIN WITH MINERALS) TABS tablet Take 1 tablet by mouth daily. One-A-Day Women's Vitamin    [provider]  torsemide  (DEMADEX ) 20 MG tablet Take 1 tablet (20 mg total) by mouth 2 (two) times daily. Increase to 1 tablet (20 mg total) by mouth THREE TIMES daily (total daily dose MAX 60 mg) as needed for up to 3 days for increased leg swelling, shortness of breath, weight gain 5+ lbs over 1-2 days. CALL YOUR CARDIOLOGIST IF YOU NEED TO TAKE EXTRA MEDICINE, THEY MAY WANT TO SEE YOU IN OFFICE OR REFER TO ER IF NEEDED 11/12/23 12/12/23  Alexander, Natalie, DO  traZODone  (DESYREL ) 100 MG tablet Take 100 mg by mouth at bedtime.    [provider]  TRELEGY ELLIPTA 100-62.5-25 MCG/ACT AEPB Inhale 1 puff into the lungs daily. 08/03/22   [provider]    Physical Exam: Vitals:   11/15/23 1215 11/15/23 1230 11/15/23 1300 11/15/23 1330  BP: (!) 181/87 (!) 184/88 (!) 179/84 (!) 181/79  Pulse: 64 62 68 62  Resp: 17 20 (!) 25 19  Temp:      TempSrc:      SpO2: 100% 99% 99% 99%   Physical Exam Constitutional:      Appearance: She is normal weight.  HENT:     Head: Normocephalic and atraumatic.     Nose: Nose normal.     Mouth/Throat:     Mouth: Mucous membranes are moist.  Eyes:     Pupils: Pupils are equal, round, and reactive to light.  Cardiovascular:     Rate and Rhythm: Normal rate and regular rhythm.  Pulmonary:     Effort: Pulmonary effort is normal.  Abdominal:     General: Bowel sounds are normal.  Musculoskeletal:     Cervical back: Normal range of motion.  Skin:    General: Skin is warm.  Neurological:     General: No focal deficit present.  Psychiatric:        Mood and Affect: Mood normal.     Data Reviewed:  There are no new results to review at this time.  DG Chest Portable 1 View CLINICAL DATA:  Dyspnea  EXAM: PORTABLE CHEST 1 VIEW  COMPARISON:  11/11/2023 chest radiograph.  FINDINGS: Right total shoulder arthroplasty. Stable 2 lead left subclavian pacemaker. Stable cardiomediastinal silhouette with mild cardiomegaly. No pneumothorax. Small bilateral pleural effusions are similar. Similar borderline mild pulmonary edema. Increased hazy bibasilar lung opacities.  IMPRESSION: 1. Similar borderline mild pulmonary edema and small bilateral pleural effusions. 2. Increased hazy bibasilar lung opacities, favor atelectasis, difficult to exclude a component of aspiration or pneumonia.  Electronically Signed   By: Levell Reach M.D.   On: 11/15/2023 11:17  Lab Results  Component Value Date   WBC 6.4 11/15/2023   HGB 11.2 (L) 11/15/2023   HCT 34.9 (L) 11/15/2023   MCV 96.7 11/15/2023   PLT 213 11/15/2023   Last metabolic panel Lab Results  Component Value Date   GLUCOSE 125  (H) 11/15/2023   NA 138 11/15/2023   K 4.4 11/15/2023   CL 105 11/15/2023   CO2 26 11/15/2023   BUN 61 (H) 11/15/2023  CREATININE 2.42 (H) 11/15/2023   GFRNONAA 19 (L) 11/15/2023   CALCIUM 8.4 (L) 11/15/2023   PHOS 3.0 08/19/2023   PROT 6.1 (L) 11/15/2023   ALBUMIN  3.0 (L) 11/15/2023   BILITOT 1.0 11/15/2023   ALKPHOS 81 11/15/2023   AST 41 11/15/2023   ALT 28 11/15/2023   ANIONGAP 7 11/15/2023    Assessment and Plan: * Acute respiratory failure with hypoxia (HCC) Decompensated resp status requiring 2L [place] with predominant concern for volume overload in setting of HFrEF  BNP> 4500 CXR w/ pulmonary edema  No overt signs of infection at present  Continue w/ supplemental O2  Monitor resp status w/ diuresis.   Acute on chronic HFrEF (heart failure with reduced ejection fraction) (HCC) 2D echo on 08/09/2023 showed EF 25-30% + worsening orthopnea, PND- currently on 2L [place]  BNP >4500  CXR w/ pulmonary edema and pleural effusions  Recurring issue with multiple admissions for similar presentation over multiple months  IV lasix   Weight today 70.9 kg  Strict Is and Os and daily weights  Monitor    Type II diabetes mellitus with renal manifestations (HCC) Blood sugars 120s  SSI  Monitor    Paroxysmal atrial fibrillation (HCC) Rate controlled at present  Cont home regimen including eliquis , amiodarone     Essential hypertension BP stable  Titrate home regimen    CKD (chronic kidney disease) stage 4, GFR 15-29 ml/min (HCC) Cr 2.4 w/ GFR in the upper teens  Near baseline  Monitor with diuresis   Asthma, chronic Stable from a resp standpoint  Cont home inhalers       Advance Care Planning:   Code Status: Limited: Do not attempt resuscitation (DNR) -DNR-LIMITED -Do Not Intubate/DNI    Consults: Cardiology, Palliative care   Family Communication: No family at the bedside   Severity of Illness: The appropriate patient status for this patient is OBSERVATION.  Observation status is judged to be reasonable and necessary in order to provide the required intensity of service to ensure the patient's safety. The patient's presenting symptoms, physical exam findings, and initial radiographic and laboratory data in the context of their medical condition is felt to place them at decreased risk for further clinical deterioration. Furthermore, it is anticipated that the patient will be medically stable for discharge from the hospital within 2 midnights of admission.   Author: Corrinne Din, MD 11/15/2023 2:50 PM  For on call review [email or web address].

## 2023-11-15 NOTE — ED Notes (Signed)
 This tech and [employer] cleaned pt up after BM. New brief and chux pad were placed. New blankets placed on pt. Call bell in reach.

## 2023-11-15 NOTE — ED Provider Notes (Signed)
 [facility] Provider Note    Event Date/Time   First MD Initiated Contact with Patient 11/15/23 1028     (approximate)   History   Chief Complaint Shortness of Breath   HPI  Leslie Parker is a 89 y.o. female with past medical history of hypertension, diabetes, CHF, atrial fibrillation on Eliquis , sick sinus syndrome status post pacemaker, COPD, and CKD who presents to the ED complaining of difficulty breathing.  Patient reports that she woke up this morning feeling more short of breath than usual, describes a chronic dry cough today that is typical for her.  She denies any fevers and has not had any pain in her chest, states she had swelling in her legs during recent admission but this is improved today.  Per EMS, she was found to be hypoxic to 88% on room air, placed on 2 L nasal cannula with improvement.  Patient describes symptoms as similar to prior CHF exacerbations.     Physical Exam   Triage Vital Signs: ED Triage Vitals [11/15/23 1028]  Encounter Vitals Group     BP (!) 195/95     Systolic BP Percentile      Diastolic BP Percentile      Pulse Rate 71     Resp (!) 25     Temp 97.7 F (36.5 C)     Temp Source Oral     SpO2 99 %     Weight      Height      Head Circumference      Peak Flow      Pain Score      Pain Loc      Pain Education      Exclude from Growth Chart     Most recent vital signs: Vitals:   11/15/23 1300 11/15/23 1330  BP: (!) 179/84 (!) 181/79  Pulse: 68 62  Resp: (!) 25 19  Temp:    SpO2: 99% 99%    Constitutional: Alert and oriented. Eyes: Conjunctivae are normal. Head: Atraumatic. Nose: No congestion/rhinnorhea. Mouth/Throat: Mucous membranes are moist.  Cardiovascular: Normal rate, regular rhythm. Grossly normal heart sounds.  2+ radial pulses bilaterally. Respiratory: Tachypneic with mildly increased respiratory effort.  No retractions. Lungs with crackles to bilateral bases. Gastrointestinal: Soft and  nontender. No distention. Musculoskeletal: No lower extremity tenderness nor edema.  Neurologic:  Normal speech and language. No gross focal neurologic deficits are appreciated.    ED Results / Procedures / Treatments   Labs (all labs ordered are listed, but only abnormal results are displayed) Labs Reviewed  CBC WITH DIFFERENTIAL/PLATELET - Abnormal; Notable for the following components:      Result Value   RBC 3.61 (*)    Hemoglobin 11.2 (*)    HCT 34.9 (*)    RDW 15.7 (*)    All other components within normal limits  COMPREHENSIVE METABOLIC PANEL WITH GFR - Abnormal; Notable for the following components:   Glucose, Bld 125 (*)    BUN 61 (*)    Creatinine, Ser 2.42 (*)    Calcium 8.4 (*)    Total Protein 6.1 (*)    Albumin  3.0 (*)    GFR, Estimated 19 (*)    All other components within normal limits  BRAIN NATRIURETIC PEPTIDE - Abnormal; Notable for the following components:   B Natriuretic Peptide >4,500.0 (*)    All other components within normal limits  TROPONIN I (HIGH SENSITIVITY) - Abnormal; Notable for the following  components:   Troponin I (High Sensitivity) 35 (*)    All other components within normal limits     EKG  ED ECG REPORT I, Twilla Galea, the attending physician, personally viewed and interpreted this ECG.   Date: 11/15/2023  EKG Time: 10:29  Rate: 69  Rhythm: normal sinus rhythm  Axis: LAD  Intervals:first-degree A-V block  and nonspecific intraventricular conduction delay  ST&T Change: None  RADIOLOGY Chest x-ray reviewed and interpreted by me with pulmonary edema, no focal infiltrate noted.  PROCEDURES:  Critical Care performed: Yes, see critical care procedure note(s)  .Critical Care  Performed by: Twilla Galea, MD Authorized by: Twilla Galea, MD   Critical care provider statement:    Critical care time (minutes):  30   Critical care time was exclusive of:  Separately billable procedures and treating other patients and  teaching time   Critical care was necessary to treat or prevent imminent or life-threatening deterioration of the following conditions:  Respiratory failure   Critical care was time spent personally by me on the following activities:  Development of treatment plan with patient or surrogate, discussions with consultants, evaluation of patient's response to treatment, examination of patient, ordering and review of laboratory studies, ordering and review of radiographic studies, ordering and performing treatments and interventions, pulse oximetry, re-evaluation of patient's condition and review of old charts   I assumed direction of critical care for this patient from another provider in my specialty: no     Care discussed with: admitting provider      MEDICATIONS ORDERED IN ED: Medications  furosemide  (LASIX ) injection 80 mg (80 mg Intravenous Given 11/15/23 1249)     IMPRESSION / MDM / ASSESSMENT AND PLAN / ED COURSE  I reviewed the triage vital signs and the nursing notes.                              89 y.o. female with past medical history of hypertension, diabetes, CHF, sick sinus syndrome status post pacemaker, atrial fibrillation on Eliquis , COPD, and CKD who presents to the ED complaining of shortness of breath since waking up this morning.  Patient's presentation is most consistent with acute presentation with potential threat to life or bodily function.  Differential diagnosis includes, but is not limited to, ACS, PE, pneumonia, CHF exacerbation, COPD exacerbation, pleural effusion, anemia, electrolyte abnormality, AKI.  Patient chronically ill-appearing but nontoxic and in no acute distress, currently maintaining oxygen saturations at 99% on 2 L and will attempt to wean this.  She does appear mildly tachypneic, but no obvious signs of fluid overload on exam.  EKG shows no evidence arrhythmia or ischemia, labs and chest x-ray are pending at this time.  Patient became hypoxic and  again placed on supplemental oxygen with improvement.  Chest x-ray concerning for pulmonary edema and we will diurese with IV Lasix .  She has a mild AKI without acute electrolyte abnormality, no significant anemia or leukocytosis noted.  Troponin similar to previous and BNP significantly elevated consistent with CHF exacerbation.  Case discussed with hospitalist for admission.      FINAL CLINICAL IMPRESSION(S) / ED DIAGNOSES   Final diagnoses:  Acute on chronic congestive heart failure, unspecified heart failure type (HCC)  Acute respiratory failure with hypoxia (HCC)     Rx / DC Orders   ED Discharge Orders     None        Note:  This document was  prepared using Conservation officer, historic buildings and may include unintentional dictation errors.   Twilla Galea, MD 11/15/23 [phone]

## 2023-11-15 NOTE — TOC CM/SW Note (Signed)
 CHF patient.  No PT/OT consult placed.  No TOC needs at this time. Re-consult if any needs arise for TOC.  CSW signing off.

## 2023-11-15 NOTE — Assessment & Plan Note (Addendum)
 Patient on Coreg , hydralazine  and Imdur .  Cardiology added Entresto.

## 2023-11-15 NOTE — ED Notes (Signed)
 Provider notified of pts elevated pressure of 183/80

## 2023-11-16 DIAGNOSIS — I495 Sick sinus syndrome: Secondary | ICD-10-CM | POA: Diagnosis present

## 2023-11-16 DIAGNOSIS — Z7984 Long term (current) use of oral hypoglycemic drugs: Secondary | ICD-10-CM | POA: Diagnosis not present

## 2023-11-16 DIAGNOSIS — J9601 Acute respiratory failure with hypoxia: Secondary | ICD-10-CM

## 2023-11-16 DIAGNOSIS — M8580 Other specified disorders of bone density and structure, unspecified site: Secondary | ICD-10-CM | POA: Diagnosis present

## 2023-11-16 DIAGNOSIS — Z8249 Family history of ischemic heart disease and other diseases of the circulatory system: Secondary | ICD-10-CM | POA: Diagnosis not present

## 2023-11-16 DIAGNOSIS — Z825 Family history of asthma and other chronic lower respiratory diseases: Secondary | ICD-10-CM | POA: Diagnosis not present

## 2023-11-16 DIAGNOSIS — Z7901 Long term (current) use of anticoagulants: Secondary | ICD-10-CM | POA: Diagnosis not present

## 2023-11-16 DIAGNOSIS — J4489 Other specified chronic obstructive pulmonary disease: Secondary | ICD-10-CM | POA: Diagnosis present

## 2023-11-16 DIAGNOSIS — E1122 Type 2 diabetes mellitus with diabetic chronic kidney disease: Secondary | ICD-10-CM | POA: Diagnosis present

## 2023-11-16 DIAGNOSIS — I10 Essential (primary) hypertension: Secondary | ICD-10-CM

## 2023-11-16 DIAGNOSIS — Z96653 Presence of artificial knee joint, bilateral: Secondary | ICD-10-CM | POA: Diagnosis present

## 2023-11-16 DIAGNOSIS — Z515 Encounter for palliative care: Secondary | ICD-10-CM | POA: Diagnosis not present

## 2023-11-16 DIAGNOSIS — E785 Hyperlipidemia, unspecified: Secondary | ICD-10-CM | POA: Diagnosis present

## 2023-11-16 DIAGNOSIS — Z961 Presence of intraocular lens: Secondary | ICD-10-CM | POA: Diagnosis present

## 2023-11-16 DIAGNOSIS — Z79899 Other long term (current) drug therapy: Secondary | ICD-10-CM | POA: Diagnosis not present

## 2023-11-16 DIAGNOSIS — J45909 Unspecified asthma, uncomplicated: Secondary | ICD-10-CM

## 2023-11-16 DIAGNOSIS — I5023 Acute on chronic systolic (congestive) heart failure: Secondary | ICD-10-CM | POA: Diagnosis present

## 2023-11-16 DIAGNOSIS — Z90710 Acquired absence of both cervix and uterus: Secondary | ICD-10-CM | POA: Diagnosis not present

## 2023-11-16 DIAGNOSIS — Z96611 Presence of right artificial shoulder joint: Secondary | ICD-10-CM | POA: Diagnosis present

## 2023-11-16 DIAGNOSIS — Z950 Presence of cardiac pacemaker: Secondary | ICD-10-CM | POA: Diagnosis not present

## 2023-11-16 DIAGNOSIS — M1990 Unspecified osteoarthritis, unspecified site: Secondary | ICD-10-CM | POA: Diagnosis present

## 2023-11-16 DIAGNOSIS — I130 Hypertensive heart and chronic kidney disease with heart failure and stage 1 through stage 4 chronic kidney disease, or unspecified chronic kidney disease: Secondary | ICD-10-CM | POA: Diagnosis present

## 2023-11-16 DIAGNOSIS — Z66 Do not resuscitate: Secondary | ICD-10-CM | POA: Diagnosis present

## 2023-11-16 DIAGNOSIS — R0602 Shortness of breath: Secondary | ICD-10-CM | POA: Diagnosis present

## 2023-11-16 DIAGNOSIS — R011 Cardiac murmur, unspecified: Secondary | ICD-10-CM | POA: Diagnosis present

## 2023-11-16 DIAGNOSIS — I480 Paroxysmal atrial fibrillation: Secondary | ICD-10-CM

## 2023-11-16 DIAGNOSIS — N184 Chronic kidney disease, stage 4 (severe): Secondary | ICD-10-CM | POA: Diagnosis present

## 2023-11-16 LAB — COMPREHENSIVE METABOLIC PANEL WITH GFR
ALT: 26 U/L (ref 0–44)
AST: 29 U/L (ref 15–41)
Albumin: 3.1 g/dL — ABNORMAL LOW (ref 3.5–5.0)
Alkaline Phosphatase: 77 U/L (ref 38–126)
Anion gap: 10 (ref 5–15)
BUN: 56 mg/dL — ABNORMAL HIGH (ref 8–23)
CO2: 28 mmol/L (ref 22–32)
Calcium: 8.6 mg/dL — ABNORMAL LOW (ref 8.9–10.3)
Chloride: 103 mmol/L (ref 98–111)
Creatinine, Ser: 2.23 mg/dL — ABNORMAL HIGH (ref 0.44–1.00)
GFR, Estimated: 21 mL/min — ABNORMAL LOW (ref >60)
Glucose, Bld: 119 mg/dL — ABNORMAL HIGH (ref 70–99)
Potassium: 4 mmol/L (ref 3.5–5.1)
Sodium: 141 mmol/L (ref 135–145)
Total Bilirubin: 1.2 mg/dL (ref 0.0–1.2)
Total Protein: 5.9 g/dL — ABNORMAL LOW (ref 6.5–8.1)

## 2023-11-16 LAB — CBC
HCT: 34.7 % — ABNORMAL LOW (ref 36.0–46.0)
Hemoglobin: 11.6 g/dL — ABNORMAL LOW (ref 12.0–15.0)
MCH: 31.7 pg (ref 26.0–34.0)
MCHC: 33.4 g/dL (ref 30.0–36.0)
MCV: 94.8 fL (ref 80.0–100.0)
Platelets: 227 10*3/uL (ref 150–400)
RBC: 3.66 MIL/uL — ABNORMAL LOW (ref 3.87–5.11)
RDW: 15.5 % (ref 11.5–15.5)
WBC: 8.6 10*3/uL (ref 4.0–10.5)
nRBC: 0 % (ref 0.0–0.2)

## 2023-11-16 LAB — MAGNESIUM: Magnesium: 2 mg/dL (ref 1.7–2.4)

## 2023-11-16 LAB — GLUCOSE, CAPILLARY
Glucose-Capillary: 145 mg/dL — ABNORMAL HIGH (ref 70–99)
Glucose-Capillary: 145 mg/dL — ABNORMAL HIGH (ref 70–99)

## 2023-11-16 MED ORDER — TORSEMIDE 20 MG PO TABS
20.0000 mg | ORAL_TABLET | Freq: Two times a day (BID) | ORAL | Status: DC
  Administered 2023-11-16 – 2023-11-17 (×2): 20 mg via ORAL
  Filled 2023-11-16 (×2): qty 1

## 2023-11-16 MED ORDER — FUROSEMIDE 10 MG/ML IJ SOLN
80.0000 mg | Freq: Two times a day (BID) | INTRAMUSCULAR | Status: DC
  Administered 2023-11-16: 80 mg via INTRAVENOUS
  Filled 2023-11-16: qty 8

## 2023-11-16 MED ORDER — ISOSORBIDE MONONITRATE ER 30 MG PO TB24
30.0000 mg | ORAL_TABLET | Freq: Two times a day (BID) | ORAL | Status: DC
  Administered 2023-11-16 – 2023-11-17 (×3): 30 mg via ORAL
  Filled 2023-11-16 (×3): qty 1

## 2023-11-16 MED ORDER — INSULIN ASPART 100 UNIT/ML IJ SOLN
0.0000 [IU] | Freq: Three times a day (TID) | INTRAMUSCULAR | Status: DC
  Administered 2023-11-17: 1 [IU] via SUBCUTANEOUS
  Filled 2023-11-16: qty 1

## 2023-11-16 MED ORDER — ISOSORBIDE MONONITRATE ER 30 MG PO TB24: 30.0000 mg | ORAL_TABLET | Freq: Every day | ORAL | Status: DC

## 2023-11-16 MED ORDER — INSULIN ASPART 100 UNIT/ML IJ SOLN: 0.0000 [IU] | Freq: Every day | INTRAMUSCULAR | Status: DC

## 2023-11-16 MED ORDER — HYDRALAZINE HCL 25 MG PO TABS
25.0000 mg | ORAL_TABLET | Freq: Three times a day (TID) | ORAL | Status: DC
  Administered 2023-11-16 – 2023-11-17 (×3): 25 mg via ORAL
  Filled 2023-11-16 (×3): qty 1

## 2023-11-16 MED ORDER — SACUBITRIL-VALSARTAN 24-26 MG PO TABS
1.0000 | ORAL_TABLET | Freq: Two times a day (BID) | ORAL | Status: DC
  Administered 2023-11-16 – 2023-11-17 (×3): 1 via ORAL
  Filled 2023-11-16 (×3): qty 1

## 2023-11-16 NOTE — TOC Initial Note (Signed)
 Transition of Care [facility]) - Initial/Assessment Note    Patient Details  Name: Leslie Parker MRN: [id number] Date of Birth: December 23, 1932  [facility]) CM/SW Contact:    Odilia Bennett, LCSW Phone Number: 11/16/2023, 10:10 AM  Clinical Narrative:  CSW met with patient. No family at bedside. CSW introduced role and explained that discharge planning would be discussed. Patient confirmed she is still active with [facility]. Per TOC note on 4/21, she is receiving PT, OT, RN, and aide services. No further concerns. CSW will continue to follow patient for support and facilitate return home once stable.                Expected Discharge Plan: Home w Home Health Services Barriers to Discharge: Continued Medical Work up   Patient Goals and CMS Choice            Expected Discharge Plan and Services     Post Acute Care Choice: Resumption of Svcs/PTA Provider Living arrangements for the past 2 months: Apartment                                      Prior Living Arrangements/Services Living arrangements for the past 2 months: Apartment   Patient language and need for interpreter reviewed:: Yes Do you feel safe going back to the place where you live?: Yes      Need for Family Participation in Patient Care: Yes (Comment)   Current home services: Home OT, Home PT, Home RN, Homehealth aide Criminal Activity/Legal Involvement Pertinent to Current Situation/Hospitalization: No - Comment as needed  Activities of Daily Living      Permission Sought/Granted Permission sought to share information with : Facility Industrial/product designer granted to share information with : Yes, Verbal Permission Granted     Permission granted to share info w AGENCY: [facility]        Emotional Assessment Appearance:: Appears stated age Attitude/Demeanor/Rapport: Engaged, Gracious Affect (typically observed): Accepting, Appropriate, Calm,  Pleasant Orientation: : Oriented to Self, Oriented to Place, Oriented to  Time, Oriented to Situation Alcohol / Substance Use: Not Applicable Psych Involvement: No (comment)  Admission diagnosis:  Acute respiratory failure with hypoxia (HCC) [J96.01] Acute on chronic congestive heart failure, unspecified heart failure type [facility]) [I50.9] Patient Active Problem List   Diagnosis Date Noted   Acute respiratory failure with hypoxia (HCC) 11/15/2023   Do not resuscitate 11/10/2023   CHF exacerbation (HCC) 11/10/2023   Myocardial injury 11/09/2023   AKI (acute kidney injury) (HCC) 11/05/2023   Insomnia 11/05/2023   Frequent hospital admissions 11/05/2023   Acute exacerbation of CHF (congestive heart failure) (HCC) 10/22/2023   Elevated troponin 10/22/2023   Malnutrition of moderate degree 09/17/2023   CHF (congestive heart failure) (HCC) 09/15/2023   Acute on chronic HFrEF (heart failure with reduced ejection fraction) (HCC) 08/19/2023   Pleural effusion due to CHF (congestive heart failure) (HCC) 08/19/2023   HTN (hypertension), malignant 08/19/2023   Ileus (HCC) 08/12/2023   Large bowel obstruction (HCC) 08/12/2023   CKD (chronic kidney disease) stage 4, GFR 15-29 ml/min (HCC) 08/12/2023   HFrEF (heart failure with reduced ejection fraction) (HCC) 08/12/2023   Acute on chronic combined systolic and diastolic CHF (congestive heart failure) (HCC) 08/10/2023   Hyponatremia 05/05/2023   Overweight (BMI 25.0-29.9) 05/01/2023   Left hip pain 05/01/2023   Hypokalemia 04/30/2023   Anemia  03/21/2023   Abnormal LFTs 03/20/2023   Type II diabetes mellitus with renal manifestations (HCC) 02/13/2023   Chronic diastolic CHF (congestive heart failure) (HCC) 02/13/2023   CKD stage 3 due to type 2 diabetes mellitus (HCC) 11/24/2022   Paroxysmal atrial fibrillation (HCC) 11/24/2022   Sick sinus syndrome (HCC) 11/15/2022   Acute on chronic diastolic CHF (congestive heart failure) (HCC) 08/27/2022    COPD (chronic obstructive pulmonary disease) (HCC) 08/23/2022   Chronic a-fib (HCC) 08/22/2022   Diabetes mellitus without complication (HCC)    Stage 3b chronic kidney disease (HCC) - Baseline scr 1.8-2.0    Postural dizziness with presyncope    Hypomagnesemia    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 [place] [address] Phone: [phone] Fax: [phone]  [id number] [place], [place] - [address] [place] [address] Phone: [phone] Fax: [phone]  [facility] [address] [place] [address] Phone: [phone] Fax: [phone]     Social Drivers of Health (SDOH) Social History: SDOH Screenings   Food Insecurity: No Food Insecurity (11/16/2023)  Housing: Low Risk  (11/16/2023)  Transportation Needs: No Transportation Needs (11/16/2023)  Utilities: Not At Risk (11/16/2023)  Alcohol Screen: Low Risk  (08/20/2023)  Financial Resource Strain: Low Risk  (08/20/2023)  Social Connections: Moderately Integrated (11/16/2023)  Tobacco Use: Low Risk  (11/09/2023)   SDOH Interventions:     Readmission Risk Interventions    10/22/2023    2:28 PM 09/16/2023    2:52 PM 08/20/2023    3:45 PM  Readmission Risk Prevention Plan  Transportation Screening Complete Complete Complete  Medication Review Oceanographer) Complete Complete Complete  PCP or Specialist appointment within 3-5 days of discharge  Complete Complete  HRI or Home Care Consult Not Complete Patient refused Complete  HRI or Home Care Consult Pt Refusal Comments No PT recs at this time.    SW Recovery Care/Counseling Consult Complete Complete Complete  Palliative Care Screening Not Applicable Not Applicable Not Applicable   Skilled Nursing Facility Not Applicable Not Applicable Not Applicable

## 2023-11-16 NOTE — Progress Notes (Signed)
 Patient ID: Leslie Parker, female   DOB: [birth year], 89 y.o.   MRN: [id number] [facility] Cardiology    SUBJECTIVE: States less short of breath woke up with acute dyspnea states less short of breath woke up with acute dyspnea appeared to be in rapid atrial fibrillation which may have contributed to her decompensation on amiodarone  beta-blockers Eliquis  will advance heart failure therapy as well   Vitals:   11/15/23 2212 11/16/23 0014 11/16/23 0317 11/16/23 0746  BP: (!) 198/113 (!) 173/72 (!) 181/92 (!) 171/89  Pulse: 74 68 74 74  Resp: 20  18 19   Temp: (!) 97.5 F (36.4 C)  97.9 F (36.6 C) 98 F (36.7 C)  TempSrc:    Oral  SpO2: 92%  96% (!) 89%     Intake/Output Summary (Last 24 hours) at 11/16/2023 1312 Last data filed at 11/16/2023 0900 Gross per 24 hour  Intake 240 ml  Output 600 ml  Net -360 ml      PHYSICAL EXAM  General: Well developed, well nourished, in no acute distress HEENT:  Normocephalic and atramatic Neck:  No JVD.  Lungs: Clear bilaterally to auscultation and percussion. Heart: HRRR . Normal S1 and S2 without gallops or murmurs.  Abdomen: Bowel sounds are positive, abdomen soft and non-tender  Msk:  Back normal, normal gait. Normal strength and tone for age. Extremities: No clubbing, cyanosis or edema.   Neuro: Alert and oriented X 3. Psych:  Good affect, responds appropriately   LABS: Basic Metabolic Panel: Recent Labs    11/15/23 1126 11/15/23 2052 11/16/23 0418  NA 138  --  141  K 4.4  --  4.0  CL 105  --  103  CO2 26  --  28  GLUCOSE 125*  --  119*  BUN 61*  --  56*  CREATININE 2.42*  --  2.23*  CALCIUM 8.4*  --  8.6*  MG  --  2.0 2.0   Liver Function Tests: Recent Labs    11/15/23 1126 11/16/23 0418  AST 41 29  ALT 28 26  ALKPHOS 81 77  BILITOT 1.0 1.2  PROT 6.1* 5.9*  ALBUMIN  3.0* 3.1*   No results for input(s): "LIPASE", "AMYLASE" in the last 72 hours. CBC: Recent Labs    11/15/23 1126 11/16/23 0418  WBC 6.4 8.6  NEUTROABS  4.7  --   HGB 11.2* 11.6*  HCT 34.9* 34.7*  MCV 96.7 94.8  PLT 213 227   Cardiac Enzymes: No results for input(s): "CKTOTAL", "CKMB", "CKMBINDEX", "TROPONINI" in the last 72 hours. BNP: Invalid input(s): "POCBNP" D-Dimer: No results for input(s): "DDIMER" in the last 72 hours. Hemoglobin A1C: No results for input(s): "HGBA1C" in the last 72 hours. Fasting Lipid Panel: No results for input(s): "CHOL", "HDL", "LDLCALC", "TRIG", "CHOLHDL", "LDLDIRECT" in the last 72 hours. Thyroid  Function Tests: No results for input(s): "TSH", "T4TOTAL", "T3FREE", "THYROIDAB" in the last 72 hours.  Invalid input(s): "FREET3" Anemia Panel: No results for input(s): "VITAMINB12", "FOLATE", "FERRITIN", "TIBC", "IRON", "RETICCTPCT" in the last 72 hours.  DG Chest Portable 1 View Result Date: 11/15/2023 CLINICAL DATA:  Dyspnea EXAM: PORTABLE CHEST 1 VIEW COMPARISON:  11/11/2023 chest radiograph. FINDINGS: Right total shoulder arthroplasty. Stable 2 lead left subclavian pacemaker. Stable cardiomediastinal silhouette with mild cardiomegaly. No pneumothorax. Small bilateral pleural effusions are similar. Similar borderline mild pulmonary edema. Increased hazy bibasilar lung opacities. IMPRESSION: 1. Similar borderline mild pulmonary edema and small bilateral pleural effusions. 2. Increased hazy bibasilar lung opacities, favor atelectasis, difficult to  exclude a component of aspiration or pneumonia. Electronically Signed   By: Levell Reach M.D.   On: 11/15/2023 11:17     Echo severely depressed left ventricular function EF around 25%  TELEMETRY: Sinus rhythm first-degree interventricular conduction delay nonspecific ST-T changes:  ASSESSMENT AND PLAN:  Principal Problem:   Acute respiratory failure with hypoxia (HCC) Active Problems:   Asthma, chronic   Essential hypertension   Paroxysmal atrial fibrillation (HCC)   Type II diabetes mellitus with renal manifestations (HCC)   CKD (chronic kidney disease)  stage 4, GFR 15-29 ml/min (HCC)   Acute on chronic HFrEF (heart failure with reduced ejection fraction) (HCC)    Plan Paroxysmal atrial fibrillation which may have resulted in decompensation patient received amiodarone  anticoagulation now on Eliquis  amiodarone  and beta-blockers Acute on chronic systolic congestive heart failure increase diuretics advance Imdur  hydralazine  will start Entresto and follow renal function consider Farxiga  Hypertension poorly controlled advance hydralazine  Imdur  start Entresto Switch from Lasix  to torsemide  to help with diuresis Renal insufficiency stage IV continue to follow renal function consider nephrology input Diabetes type 2 continue current management will consider adding Farxiga    Antonette Batters, MD 11/16/2023 1:12 PM

## 2023-11-16 NOTE — Progress Notes (Signed)
 Progress Note   Patient: Leslie Parker:[id number] DOB: 09-26-1932 DOA: 11/15/2023     0 DOS: the patient was seen and examined on 11/16/2023   Brief hospital course: 89 y.o. female with medical history significant of chronic HFrEF  EF 25-30%, SSS (s/p of PPM), A fib on Eliquis , HTN, HLD, DM, COPD, CKD-IV, osteopenia presenting with acute respiratory failure with hypoxia, acute on chronic HFrEF.  Patient noted to have been admitted for similar issues April 19 through April 22.  Patient will roughly 8+ admissions since July 2025 for similar issues.  Has noted to have refused SNF as well as assisted living placement in the past.  During the April admission patient was diuresed.  Also had a left thoracentesis yielding around 850 cc.  Patient reports increased work of breathing over the past 1 to 2 days.  No fevers or chills.  No nausea or vomiting.  No chest pain.  Reports compliance with home diuretic regimen.  Patient denies any NSAID use or high salt intake.  Mild orthopnea.  Lower extremity swelling grossly stable. Presented to the ER afebrile, hemodynamically stable.  Satting well on room air.  White count 6.4, hemoglobin 11.2, platelets 213, creatinine 2.42, glucose 125 troponin in the 30s.  BNP greater than 4500.  Chest x-ray with pulmonary edema.  4/26.  Patient feels better than when she came in.  She states that she does not wear oxygen at home.  Came in very short of breath.  Numerous hospitalizations.  Case discussed with cardiology.  Entresto added.  Assessment and Plan: * Acute on chronic HFrEF (heart failure with reduced ejection fraction) (HCC) Last ejection fraction 25 to 30%.  Case discussed with cardiology with her numerous hospitalizations.  I had the patient on Lasix  80 mg twice a day by cardiology changed to Demadex  20 mg twice a day.  Entresto started.  Patient also on Imdur  hydralazine  and Coreg .  Numerous hospitalizations.  Appreciate palliative care consultation.   Acute  respiratory failure with hypoxia (HCC) Pulse ox 89% on room air.  Will check ambulation room air sats.  CKD (chronic kidney disease) stage 4, GFR 15-29 ml/min (HCC) Creatinine 2.23.  Watch with diuresis.  Paroxysmal atrial fibrillation (HCC) Continue Coreg , eliquis , amiodarone .  Called central telemetry and no low heart rate seen.   Essential hypertension Patient on Coreg , hydralazine  and Imdur .  Cardiology added Entresto.  Type II diabetes mellitus with renal manifestations (HCC) Will place on sliding scale insulin .  Hemoglobin A1c 7 point  Asthma, chronic No wheezing heard.       Subjective: Patient feeling better than when she came in.  Came in with shortness of breath.  Admitted with acute on chronic CHF exacerbation.  Physical Exam: Vitals:   11/15/23 2212 11/16/23 0014 11/16/23 0317 11/16/23 0746  BP: (!) 198/113 (!) 173/72 (!) 181/92 (!) 171/89  Pulse: 74 68 74 74  Resp: 20  18 19   Temp: (!) 97.5 F (36.4 C)  97.9 F (36.6 C) 98 F (36.7 C)  TempSrc:    Oral  SpO2: 92%  96% (!) 89%   Physical Exam HENT:     Head: Normocephalic.     Mouth/Throat:     Pharynx: No oropharyngeal exudate.  Eyes:     General: Lids are normal.     Conjunctiva/sclera: Conjunctivae normal.  Cardiovascular:     Rate and Rhythm: Normal rate and regular rhythm.     Heart sounds: Normal heart sounds, S1 normal and S2 normal.  Pulmonary:     Breath sounds: Examination of the right-lower field reveals decreased breath sounds. Examination of the left-lower field reveals decreased breath sounds. Decreased breath sounds present. No wheezing, rhonchi or rales.  Abdominal:     Palpations: Abdomen is soft.     Tenderness: There is no abdominal tenderness.  Musculoskeletal:     Right lower leg: Swelling present.     Left lower leg: Swelling present.  Skin:    General: Skin is warm.     Findings: No rash.  Neurological:     Mental Status: She is alert and oriented to person, place, and  time.     Data Reviewed: Creatinine 2.23, BNP 4500, hemoglobin 11.6  Family Communication: Spoke with granddaughter on the phone  Disposition: Status is: Inpatient.  Tough balance between her kidney function and heart failure.  Patient has a poor ejection fraction.  Making medication adjustments today.  Palliative care consultation for numerous hospitalizations.  Planned Discharge Destination: Home with Home Health    Time spent: 28 minutes  Author: Verla Glaze, MD 11/16/2023 3:58 PM  For on call review [email or web address].

## 2023-11-16 NOTE — Plan of Care (Signed)

## 2023-11-16 NOTE — Plan of Care (Signed)
   Problem: Education: Goal: Knowledge of General Education information will improve Description Including pain rating scale, medication(s)/side effects and non-pharmacologic comfort measures Outcome: Progressing

## 2023-11-16 NOTE — Hospital Course (Signed)
 89 y.o. female with medical history significant of chronic HFrEF  EF 25-30%, SSS (s/p of PPM), A fib on Eliquis , HTN, HLD, DM, COPD, CKD-IV, osteopenia presenting with acute respiratory failure with hypoxia, acute on chronic HFrEF.  Patient noted to have been admitted for similar issues April 19 through April 22.  Patient will roughly 8+ admissions since July 2025 for similar issues.  Has noted to have refused SNF as well as assisted living placement in the past.  During the April admission patient was diuresed.  Also had a left thoracentesis yielding around 850 cc.  Patient reports increased work of breathing over the past 1 to 2 days.  No fevers or chills.  No nausea or vomiting.  No chest pain.  Reports compliance with home diuretic regimen.  Patient denies any NSAID use or high salt intake.  Mild orthopnea.  Lower extremity swelling grossly stable. Presented to the ER afebrile, hemodynamically stable.  Satting well on room air.  White count 6.4, hemoglobin 11.2, platelets 213, creatinine 2.42, glucose 125 troponin in the 30s.  BNP greater than 4500.  Chest x-ray with pulmonary edema.  4/26.  Patient feels better than when she came in.  She states that she does not wear oxygen at home.  Came in very short of breath.  Numerous hospitalizations.  Case discussed with cardiology.  Entresto added. 4/27.  Patient lying flat and breathing okay.  Seen by palliative care yesterday and patient will go home with hospice.

## 2023-11-16 NOTE — Care Management Obs Status (Signed)
 MEDICARE OBSERVATION STATUS NOTIFICATION   Patient Details  Name: SCHAE DOUTHAT MRN: [id number] Date of Birth: October 15, 1932   Medicare Observation Status Notification Given:  Yes    Odilia Bennett, LCSW 11/16/2023, 10:05 AM

## 2023-11-16 NOTE — Progress Notes (Signed)
 PT Cancellation Note  Patient Details Name: Leslie Parker MRN: [id number] DOB: 05-May-1933   Cancelled Treatment:    Reason Eval/Treat Not Completed: Other (comment): Orders Received, Chart Reviewed. Initial attempt this AM, palliative care at bedside. Completed second attempt this PM, patient supine in bed upon arrival, refusing participation due to fatigue/SOB. Request therapist to come back later. Will re-attempt at later date/time.    Hedi Barkan M Fairly, PT, DPT 11/16/23 1:21 PM

## 2023-11-16 NOTE — Consult Note (Signed)
 Consultation Note Date: 11/16/2023   Patient Name: Leslie Parker  DOB: 15-Dec-1932  MRN: [id number]  Age / Sex: 89 y.o., female  PCP: Rex Castor, MD Referring Physician: Verla Glaze, MD  Reason for Consultation: Establishing goals of care   HPI/Brief Hospital Course: 89 y.o. female  with past medical history of HFrEF with EF 25 to 30%, SSS status post PPM, A-fib on Eliquis , hypertension, hyperlipidemia, diabetes, COPD, CKD stage IV admitted from home on 11/15/2023 with shortness of breath.  Admitted and being treated for acute respiratory failure with hypoxia secondary to acute on chronic HFrEF.  BNP found to be greater than 4500 has been started on IV diuresis.  Noted 11 inpatient admits within the last 6 months  Last admission 4/19 - 4/22 for CHF exacerbation, followed by PMT during that admission  Palliative medicine was consulted for assisting with goals of care conversations.  Subjective:  Extensive chart review has been completed prior to meeting patient including labs, vital signs, imaging, progress notes, orders, and available advanced directive documents from current and previous encounters.  Visited with Leslie Parker at her bedside.  She is awake, alert and oriented and able to engage in goals of care conversations.  No family at bedside during time of visit.  Introduced myself as a Publishing rights manager as a member of the palliative care team. Explained palliative medicine is specialized medical care for people living with serious illness. It focuses on providing relief from the symptoms and stress of a serious illness. The goal is to improve quality of life for both the patient and the family.   Leslie Parker shares a brief life review.  She shares she has 3 children 1 son and 2 daughters.  1 son was recently diagnosed with terminal cancer.  1 daughter lives in [place]  and her other daughter lives at the [facility].  Her daughter that lives at  the [place], Leslie Parker came in last night and is planning to visit later today at bedside.  She worked for many years on [employer] in [employer].  Leslie Parker shares that she is tired and exhausted from coming back and forth to the hospital.  She recalls being at home for about 2 days after last discharge and noticed during that time the fluid building back up in her legs and eventually causing her to have difficulty breathing.  We discussed patient's current illness and what it means in the larger context of patient's on-going co-morbidities. Natural disease trajectory and expectations at EOL were discussed.  Detailed discussions were had regarding her multiple underlying chronic comorbid conditions.  We also discussed expectations around reaching or nearing end-stage of her multiple chronic diseases.  The difference between aggressive medical intervention and comfort care was discussed.  We also discussed the overall philosophy of hospice and that hospice services can be provided in the home.  She is clear she wishes to remain home and is open to having hospice services come in to support her.  She shares she is come to a place of acceptance that her disease processes are nearing end-stage.  She wishes to focus on being comfortable and surrounding herself with family.  Leslie Parker confirms her CODE STATUS.  She requests I return to bedside once her daughter Leslie Parker has arrived to continue these conversations and explained to her daughter her wishes.  Returned to bedside to meet with daughter-Leslie Parker and granddaughter-Leslie Parker.  Discussions had as noted above regarding Leslie Parker underlying chronic comorbid conditions,  chronic disease trajectory and end-of-life expectations.  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  Leslie Parker shares when she first arrived Leslie Parker shared with her I  would be coming to discuss options such as hospice moving forward.  We discussed the overall philosophy of hospice, services offered and options of hospice being provided in the home.  Leslie Parker has been clear with her family as well that she wishes to be at home.  Leslie Parker agrees to move forward with hospice services.  I discussed importance of continued conversations with family/support persons and all members of their medical team regarding overall plan of care and treatment options ensuring decisions are in alignment with patients goals of care.  All questions/concerns addressed. Emotional support provided to patient/family/support persons. PMT will continue to follow and support patient as needed.  Objective: Primary Diagnoses: Present on Admission:  Acute respiratory failure with hypoxia (HCC)  Acute on chronic HFrEF (heart failure with reduced ejection fraction) (HCC)  Type II diabetes mellitus with renal manifestations (HCC)  Paroxysmal atrial fibrillation (HCC)  Essential hypertension  CKD (chronic kidney disease) stage 4, GFR 15-29 ml/min (HCC)  Asthma, chronic   Physical Exam Constitutional:      General: She is not in acute distress.    Appearance: She is ill-appearing.  Pulmonary:     Effort: Tachypnea present. No respiratory distress.  Skin:    General: Skin is warm and dry.  Neurological:     Mental Status: She is alert and oriented to person, place, and time.  Psychiatric:        Mood and Affect: Mood normal.        Thought Content: Thought content normal.     Vital Signs: BP (!) 171/89 (BP Location: Left Arm)   Pulse 74   Temp 98 F (36.7 C) (Oral)   Resp 19   SpO2 (!) 89%  Pain Scale: 0-10   Pain Score: 5   IO: Intake/output summary:  Intake/Output Summary (Last 24 hours) at 11/16/2023 1230 Last data filed at 11/16/2023 0900 Gross per 24 hour  Intake 240 ml  Output 600 ml  Net -360 ml    LBM: Last BM Date : 11/15/23 Baseline Weight:   Most recent  weight:          Palliative Assessment/Data: 60%   Assessment and Plan  SUMMARY OF RECOMMENDATIONS   DNR/DNI Engaged TOC to assist with offering choice of hospice agency Plan to discharge home with hospice services following once medically stable  Palliative Prophylaxis:   Bowel Regimen, Delirium Protocol and Frequent Pain Assessment  Discussed With: Primary team and TOC   Thank you for this consult and allowing Palliative Medicine to participate in the care of Leslie Parker. Palliative medicine will continue to follow and assist as needed.   Time Total: 75 minutes  Time spent includes: Detailed review of medical records (labs, imaging, vital signs), medically appropriate exam (mental status, respiratory, cardiac, skin), discussed with treatment team, counseling and educating patient, family and staff, documenting clinical information, medication management and coordination of care.   Signed by: Isadore Marble, DNP, AGNP-C Palliative Medicine    Please contact Palliative Medicine Team phone at [phone] for questions and concerns.  For individual provider: See Tilford Foley

## 2023-11-17 DIAGNOSIS — I5023 Acute on chronic systolic (congestive) heart failure: Secondary | ICD-10-CM | POA: Diagnosis not present

## 2023-11-17 DIAGNOSIS — N184 Chronic kidney disease, stage 4 (severe): Secondary | ICD-10-CM | POA: Diagnosis not present

## 2023-11-17 DIAGNOSIS — I480 Paroxysmal atrial fibrillation: Secondary | ICD-10-CM | POA: Diagnosis not present

## 2023-11-17 DIAGNOSIS — J9601 Acute respiratory failure with hypoxia: Secondary | ICD-10-CM | POA: Diagnosis not present

## 2023-11-17 LAB — BASIC METABOLIC PANEL WITH GFR
Anion gap: 9 (ref 5–15)
BUN: 55 mg/dL — ABNORMAL HIGH (ref 8–23)
CO2: 27 mmol/L (ref 22–32)
Calcium: 8.2 mg/dL — ABNORMAL LOW (ref 8.9–10.3)
Chloride: 101 mmol/L (ref 98–111)
Creatinine, Ser: 2.45 mg/dL — ABNORMAL HIGH (ref 0.44–1.00)
GFR, Estimated: 18 mL/min — ABNORMAL LOW (ref >60)
Glucose, Bld: 106 mg/dL — ABNORMAL HIGH (ref 70–99)
Potassium: 3.7 mmol/L (ref 3.5–5.1)
Sodium: 137 mmol/L (ref 135–145)

## 2023-11-17 LAB — GLUCOSE, CAPILLARY: Glucose-Capillary: 157 mg/dL — ABNORMAL HIGH (ref 70–99)

## 2023-11-17 MED ORDER — ISOSORBIDE MONONITRATE ER 30 MG PO TB24: 30.0000 mg | ORAL_TABLET | Freq: Every day | ORAL | 0 refills | Status: DC

## 2023-11-17 MED ORDER — ONDANSETRON HCL 4 MG PO TABS: 4.0000 mg | ORAL_TABLET | Freq: Four times a day (QID) | ORAL | 0 refills | Status: DC | PRN

## 2023-11-17 MED ORDER — MORPHINE SULFATE (CONCENTRATE) 20 MG/ML PO SOLN: 2.5000 mg | ORAL | 0 refills | Status: DC | PRN

## 2023-11-17 MED ORDER — HYDRALAZINE HCL 25 MG PO TABS: 25.0000 mg | ORAL_TABLET | Freq: Three times a day (TID) | ORAL | 0 refills | Status: DC

## 2023-11-17 MED ORDER — CARVEDILOL 6.25 MG PO TABS: 6.2500 mg | ORAL_TABLET | Freq: Two times a day (BID) | ORAL | 0 refills | Status: DC

## 2023-11-17 MED ORDER — SACUBITRIL-VALSARTAN 24-26 MG PO TABS: 1.0000 | ORAL_TABLET | Freq: Two times a day (BID) | ORAL | 0 refills | Status: DC

## 2023-11-17 NOTE — Discharge Summary (Signed)
 Physician Discharge Summary   Patient: Leslie Parker MRN: [id number] DOB: 1932-09-30  Admit date:     11/15/2023  Discharge date: 11/17/23  Discharge Physician: Verla Glaze   PCP: Rex Castor, MD   Recommendations at discharge:   Follow-up PCP 5 days Follow-up cardiology 1 week Patient will discharge home with hospice Already has an appointment at the heart failure clinic  Discharge Diagnoses: Principal Problem:   Acute on chronic HFrEF (heart failure with reduced ejection fraction) (HCC) Active Problems:   Acute respiratory failure with hypoxia (HCC)   CKD (chronic kidney disease) stage 4, GFR 15-29 ml/min (HCC)   Paroxysmal atrial fibrillation (HCC)   Essential hypertension   Type II diabetes mellitus with renal manifestations (HCC)   Asthma, chronic    Hospital Course: 89 y.o. female with medical history significant of chronic HFrEF  EF 25-30%, SSS (s/p of PPM), A fib on Eliquis , HTN, HLD, DM, COPD, CKD-IV, osteopenia presenting with acute respiratory failure with hypoxia, acute on chronic HFrEF.  Patient noted to have been admitted for similar issues April 19 through April 22.  Patient will roughly 8+ admissions since July 2025 for similar issues.  Has noted to have refused SNF as well as assisted living placement in the past.  During the April admission patient was diuresed.  Also had a left thoracentesis yielding around 850 cc.  Patient reports increased work of breathing over the past 1 to 2 days.  No fevers or chills.  No nausea or vomiting.  No chest pain.  Reports compliance with home diuretic regimen.  Patient denies any NSAID use or high salt intake.  Mild orthopnea.  Lower extremity swelling grossly stable. Presented to the ER afebrile, hemodynamically stable.  Satting well on room air.  White count 6.4, hemoglobin 11.2, platelets 213, creatinine 2.42, glucose 125 troponin in the 30s.  BNP greater than 4500.  Chest x-ray with pulmonary edema.  4/26.  Patient  feels better than when she came in.  She states that she does not wear oxygen at home.  Came in very short of breath.  Numerous hospitalizations.  Case discussed with cardiology.  Entresto added. 4/27.  Patient lying flat and breathing okay.  Seen by palliative care yesterday and patient will go home with hospice.  Assessment and Plan: * Acute on chronic HFrEF (heart failure with reduced ejection fraction) (HCC) Last ejection fraction 25 to 30%.  Case discussed yesterday with cardiology with her numerous hospitalizations.  Continue Entresto.  Continue torsemide  twice daily.  Patient also on Imdur  hydralazine  and Coreg .  Numerous hospitalizations.  Appreciate palliative care consultation.  Patient will go home with hospice.  Consider adding spironolactone and/or Farxiga  as outpatient but the patient has significant CKD.  Since Entresto was added yesterday I do not want to get these meds currently.  As needed Roxanol for air hunger.   Acute respiratory failure with hypoxia (HCC) Pulse ox 89% on room air.  Currently satting 93% on room air.  CKD (chronic kidney disease) stage 4, GFR 15-29 ml/min (HCC) Creatinine creatinine 2.45 with a GFR of 18.  Recommend following up with BMP and follow-up appointment.  Paroxysmal atrial fibrillation (HCC) Continue Coreg , eliquis , amiodarone .  This morning was paced at 68 bpm  Essential hypertension Patient on Coreg , Entresto, hydralazine  and Imdur .   Type II diabetes mellitus with renal manifestations (HCC) Hemoglobin A1c 7.  Fingersticks in the 140s here.  Will continue diet control.  Asthma, chronic No wheezing heard.  Consultants: Cardiology, palliative care, hospice liaison Procedures performed: None Disposition: Home with hospice Diet recommendation:  Cardiac diet, ADA diet DISCHARGE MEDICATION: Allergies as of 11/17/2023       Reactions   Baclofen Other (See Comments)   Elevated Cr   Simvastatin Other (See Comments)   Pt  denies Elevated LFTs with simva 40mg , stopped and resolved (see MD note 11/10/13)        Medication List     STOP taking these medications    furosemide  20 MG tablet Commonly known as: LASIX    glipiZIDE  2.5 MG 24 hr tablet Commonly known as: GLUCOTROL  XL   Magnesium  Oxide -Mg Supplement 500 MG Caps   metoprolol  tartrate 25 MG tablet Commonly known as: LOPRESSOR    traZODone  100 MG tablet Commonly known as: DESYREL        TAKE these medications    acetaminophen  325 MG tablet Commonly known as: TYLENOL  Take 2 tablets (650 mg total) by mouth every 6 (six) hours as needed for headache or mild pain (pain score 1-3).   albuterol  108 (90 Base) MCG/ACT inhaler Commonly known as: VENTOLIN  HFA Inhale 2 puffs into the lungs every 6 (six) hours as needed.   amiodarone  200 MG tablet Commonly known as: PACERONE  Take 1 tablet by mouth daily.   apixaban  2.5 MG Tabs tablet Commonly known as: ELIQUIS  Take 1 tablet (2.5 mg total) by mouth 2 (two) times daily.   ascorbic acid  250 MG Chew Commonly known as: VITAMIN C  Chew 250 mg by mouth daily.   B-12 2500 MCG Tabs Take 2,500 mcg by mouth daily.   carvedilol  6.25 MG tablet Commonly known as: COREG  Take 1 tablet (6.25 mg total) by mouth 2 (two) times daily with a meal.   hydrALAZINE  25 MG tablet Commonly known as: APRESOLINE  Take 1 tablet (25 mg total) by mouth every 8 (eight) hours. What changed:  medication strength how much to take when to take this   ipratropium-albuterol  0.5-2.5 (3) MG/3ML Soln Commonly known as: DUONEB Inhale 3 mLs into the lungs every 6 (six) hours as needed.   isosorbide  mononitrate 30 MG 24 hr tablet Commonly known as: IMDUR  Take 1 tablet (30 mg total) by mouth at bedtime.   morphine  20 MG/ML concentrated solution Commonly known as: ROXANOL Take 0.13 mLs (2.6 mg total) by mouth every 2 (two) hours as needed for severe pain (pain score 7-10) or shortness of breath (air hunger).    multivitamin with minerals Tabs tablet Take 1 tablet by mouth daily. One-A-Day Women's Vitamin   ondansetron  4 MG tablet Commonly known as: ZOFRAN  Take 1 tablet (4 mg total) by mouth every 6 (six) hours as needed for nausea.   sacubitril-valsartan 24-26 MG Commonly known as: ENTRESTO Take 1 tablet by mouth 2 (two) times daily.   torsemide  20 MG tablet Commonly known as: DEMADEX  Take 1 tablet (20 mg total) by mouth 2 (two) times daily. Increase to 1 tablet (20 mg total) by mouth THREE TIMES daily (total daily dose MAX 60 mg) as needed for up to 3 days for increased leg swelling, shortness of breath, weight gain 5+ lbs over 1-2 days. CALL YOUR CARDIOLOGIST IF YOU NEED TO TAKE EXTRA MEDICINE, THEY MAY WANT TO SEE YOU IN OFFICE OR REFER TO ER IF NEEDED   Trelegy Ellipta 100-62.5-25 MCG/ACT Aepb Generic drug: Fluticasone -Umeclidin-Vilant Inhale 1 puff into the lungs daily.   Vitamin D -1000 Max St 25 MCG (1000 UT) tablet Generic drug: Cholecalciferol  Take 1,000 Units by mouth daily.  Follow-up Information     Rex Castor, MD Follow up in 5 day(s).   Specialty: Internal Medicine Contact information: [address] [place] [address] [phone]         Antonette Batters, MD Follow up in 1 week(s).   Specialties: Cardiology, Internal Medicine Contact information: [address] [place] [address] [phone]                Discharge Exam: Blood pressure 133/56 upon discharge, pulse 62 respirations 18 pulse ox 93% on room air Physical Exam HENT:     Head: Normocephalic.     Mouth/Throat:     Pharynx: No oropharyngeal exudate.  Eyes:     General: Lids are normal.     Conjunctiva/sclera: Conjunctivae normal.  Cardiovascular:     Rate and Rhythm: Normal rate and regular rhythm.     Heart sounds: Normal heart sounds, S1 normal and S2 normal.  Pulmonary:     Breath sounds: Examination of the right-lower field reveals decreased  breath sounds. Examination of the left-lower field reveals decreased breath sounds. Decreased breath sounds present. No wheezing, rhonchi or rales.  Abdominal:     Palpations: Abdomen is soft.     Tenderness: There is no abdominal tenderness.  Musculoskeletal:     Right lower leg: Swelling present.     Left lower leg: Swelling present.  Skin:    General: Skin is warm.     Findings: No rash.  Neurological:     Mental Status: She is alert and oriented to person, place, and time.      Condition at discharge: fair  The results of significant diagnostics from this hospitalization (including imaging, microbiology, ancillary and laboratory) are listed below for reference.   Imaging Studies: DG Chest Portable 1 View Result Date: 11/15/2023 CLINICAL DATA:  Dyspnea EXAM: PORTABLE CHEST 1 VIEW COMPARISON:  11/11/2023 chest radiograph. FINDINGS: Right total shoulder arthroplasty. Stable 2 lead left subclavian pacemaker. Stable cardiomediastinal silhouette with mild cardiomegaly. No pneumothorax. Small bilateral pleural effusions are similar. Similar borderline mild pulmonary edema. Increased hazy bibasilar lung opacities. IMPRESSION: 1. Similar borderline mild pulmonary edema and small bilateral pleural effusions. 2. Increased hazy bibasilar lung opacities, favor atelectasis, difficult to exclude a component of aspiration or pneumonia. Electronically Signed   By: Levell Reach M.D.   On: 11/15/2023 11:17   DG Chest Port 1 View Result Date: 11/11/2023 CLINICAL DATA:  Post thoracentesis EXAM: PORTABLE CHEST 1 VIEW COMPARISON:  11/09/2023, 11/05/2023 FINDINGS: Left-sided pacing device as before. Right shoulder replacement. Cardiomegaly with vascular congestion. Decreased left-sided pleural effusion post thoracentesis. No definitive pneumothorax. Hazy asymmetric opacification of right thorax could relate to layering effusion. Effusion on the right is probably decreased. Aortic atherosclerosis. IMPRESSION: 1.  Decreased left-sided pleural effusion post thoracentesis. No definitive pneumothorax. 2. Hazy asymmetric opacification of right thorax could relate to layering effusion. Effusion on the right is probably decreased. 3. Cardiomegaly with vascular congestion Electronically Signed   By: Esmeralda Hedge M.D.   On: 11/11/2023 15:32   US  THORACENTESIS ASP PLEURAL SPACE W/IMG GUIDE Result Date: 11/11/2023 INDICATION: Patient with history of CHF, COPD, and CKD who presented to ED for shortness of breath. Bilateral pleural effusions noted on chest x-ray leading to referral for thoracentesis while inpatient. EXAM: ULTRASOUND GUIDED THERAPEUTIC THORACENTESIS MEDICATIONS: 8 mL 1% lidocaine  COMPLICATIONS: None immediate. PROCEDURE: An ultrasound guided thoracentesis was thoroughly discussed with the patient and questions answered. The benefits, risks, alternatives and complications were also discussed. The  patient understands and wishes to proceed with the procedure. Written consent was obtained. Ultrasound was performed to localize and mark an adequate pocket of fluid in the left chest. The area was then prepped and draped in the normal sterile fashion. 1% Lidocaine  was used for local anesthesia. Under ultrasound guidance a 6 Fr Safe-T-Centesis catheter was introduced. Thoracentesis was performed. The catheter was removed and a dressing applied. FINDINGS: A total of approximately 850 mL of yellow fluid was removed. IMPRESSION: Successful ultrasound guided therapeutic left thoracentesis yielding 850 mL of pleural fluid. Electronically Signed   By: Myrlene Asper D.O.   On: 11/11/2023 14:21   DG Chest 2 View Result Date: 11/09/2023 CLINICAL DATA:  Ongoing shortness of breath and difficulty sleeping. EXAM: CHEST - 2 VIEW COMPARISON:  11/05/2023. FINDINGS: The heart is enlarged and the mediastinal contour is within normal limits. There is atherosclerotic calcification of the aorta. Chronic interstitial prominence is noted  bilaterally. There are small bilateral pleural effusions with atelectasis or infiltrate at the lung bases. No pneumothorax is seen. A dual lead pacemaker device is present over the left chest. Right shoulder arthroplasty changes are noted. There is evidence of prior rotator cuff repair at the left shoulder. IMPRESSION: 1. Atelectasis or infiltrate at the lung bases. 2. Small bilateral pleural effusions. 3. Aortic atherosclerosis. Electronically Signed   By: Wyvonnia Heimlich M.D.   On: 11/09/2023 18:01   DG Chest Port 1 View Result Date: 11/05/2023 CLINICAL DATA:  Questionable sepsis. EXAM: PORTABLE CHEST 1 VIEW COMPARISON:  X-ray 10/22/2023 and older FINDINGS: Enlarged cardiopericardial silhouette with calcified aorta. Left upper chest pacemaker. Moderate left effusion. Tiny right. Vascular calcifications. No pneumothorax. Chronic lung changes. Interval changes with some vascular congestion. Right shoulder reverse arthroplasty seen. Films are under penetrated. Surgical Janae Mclean is along the left humeral head. IMPRESSION: Enlarged cardiopericardial silhouette calcified aorta and vascular shin. Chronic lung changes. Bilateral pleural effusions are seen, moderate left and small right. Adjacent opacity. Recommend follow-up. Electronically Signed   By: Adrianna Horde M.D.   On: 11/05/2023 12:45   DG Chest Port 1 View Result Date: 10/22/2023 CLINICAL DATA:  161096 Pain [id number].  Shortness of breath. EXAM: PORTABLE CHEST 1 VIEW COMPARISON:  10/12/2023. FINDINGS: Moderate diffuse pulmonary vascular congestion with bilateral layering pleural effusions, left greater than right, grossly similar to the prior study. There probable underlying compressive atelectatic changes at the lung bases. Bilateral lung fields are otherwise clear. No pneumothorax. Evaluation of cardiomediastinal silhouette is nondiagnostic due to left lower hemithorax opacification. There is a left sided 2-lead pacemaker. No acute osseous abnormalities. Right  reverse shoulder arthroplasty noted. The soft tissues are within normal limits. IMPRESSION: Moderate pulmonary vascular congestion with bilateral layering pleural effusions, left greater than right, grossly similar to the prior study and compatible with congestive heart failure/pulmonary edema. Electronically Signed   By: Beula Brunswick M.D.   On: 10/22/2023 10:49    Microbiology: Results for orders placed or performed during the hospital encounter of 11/09/23  Resp panel by RT-PCR (RSV, Flu A&B, Covid) Anterior Nasal Swab     Status: None   Collection Time: 11/09/23  3:39 PM   Specimen: Anterior Nasal Swab  Result Value Ref Range Status   SARS Coronavirus 2 by RT PCR NEGATIVE NEGATIVE Final    Comment: (NOTE) SARS-CoV-2 target nucleic acids are NOT DETECTED.  The SARS-CoV-2 RNA is generally detectable in upper respiratory specimens during the acute phase of infection. The lowest concentration of SARS-CoV-2 viral copies this assay can  detect is 138 copies/mL. A negative result does not preclude SARS-Cov-2 infection and should not be used as the sole basis for treatment or other patient management decisions. A negative result may occur with  improper specimen collection/handling, submission of specimen other than nasopharyngeal swab, presence of viral mutation(s) within the areas targeted by this assay, and inadequate number of viral copies(<138 copies/mL). A negative result must be combined with clinical observations, patient history, and epidemiological information. The expected result is Negative.  Fact Sheet for Patients:  BloggerCourse.com  Fact Sheet for Healthcare Providers:  SeriousBroker.it  This test is no t yet approved or cleared by the [place]  FDA and  has been authorized for detection and/or diagnosis of SARS-CoV-2 by FDA under an Emergency Use Authorization (EUA). This EUA will remain  in effect (meaning this test  can be used) for the duration of the COVID-19 declaration under Section 564(b)(1) of the Act, 21 U.S.C.section 360bbb-3(b)(1), unless the authorization is terminated  or revoked sooner.       Influenza A by PCR NEGATIVE NEGATIVE Final   Influenza B by PCR NEGATIVE NEGATIVE Final    Comment: (NOTE) The Xpert Xpress SARS-CoV-2/FLU/RSV plus assay is intended as an aid in the diagnosis of influenza from Nasopharyngeal swab specimens and should not be used as a sole basis for treatment. Nasal washings and aspirates are unacceptable for Xpert Xpress SARS-CoV-2/FLU/RSV testing.  Fact Sheet for Patients: BloggerCourse.com  Fact Sheet for Healthcare Providers: SeriousBroker.it  This test is not yet approved or cleared by the [place]  FDA and has been authorized for detection and/or diagnosis of SARS-CoV-2 by FDA under an Emergency Use Authorization (EUA). This EUA will remain in effect (meaning this test can be used) for the duration of the COVID-19 declaration under Section 564(b)(1) of the Act, 21 U.S.C. section 360bbb-3(b)(1), unless the authorization is terminated or revoked.     Resp Syncytial Virus by PCR NEGATIVE NEGATIVE Final    Comment: (NOTE) Fact Sheet for Patients: BloggerCourse.com  Fact Sheet for Healthcare Providers: SeriousBroker.it  This test is not yet approved or cleared by the [place]  FDA and has been authorized for detection and/or diagnosis of SARS-CoV-2 by FDA under an Emergency Use Authorization (EUA). This EUA will remain in effect (meaning this test can be used) for the duration of the COVID-19 declaration under Section 564(b)(1) of the Act, 21 U.S.C. section 360bbb-3(b)(1), unless the authorization is terminated or revoked.  Performed at [facility], [address] [place], [place] [address]   Body fluid culture w Gram Stain      Status: None   Collection Time: 11/11/23 11:40 AM   Specimen: PATH Cytology Pleural fluid  Result Value Ref Range Status   Specimen Description   Final    FLUID Performed at [facility], [address], [place], [place] [address]    Special Requests   Final    CYTO PLEU Performed at [facility], [address] [place], [place] [address]    Gram Stain NO WBC SEEN NO ORGANISMS SEEN   Final   Culture   Final    NO GROWTH 3 DAYS Performed at [facility], [address] [place], [place] [address]    Report Status 11/14/2023 FINAL  Final    Labs: CBC: Recent Labs  Lab 11/12/23 0554 11/15/23 1126 11/16/23 0418  WBC 5.0 6.4 8.6  NEUTROABS  --  4.7  --   HGB 9.9* 11.2* 11.6*  HCT 29.9* 34.9*  34.7*  MCV 94.6 96.7 94.8  PLT 218 213 227   Basic Metabolic Panel: Recent Labs  Lab 11/11/23 0539 11/12/23 0554 11/15/23 1126 11/15/23 2052 11/16/23 0418 11/17/23 0420  NA 141 141 138  --  141 137  K 3.5 3.9 4.4  --  4.0 3.7  CL 104 103 105  --  103 101  CO2 28 28 26   --  28 27  GLUCOSE 118* 107* 125*  --  119* 106*  BUN 58* 57* 61*  --  56* 55*  CREATININE 2.06* 1.99* 2.42*  --  2.23* 2.45*  CALCIUM 8.1* 8.3* 8.4*  --  8.6* 8.2*  MG  --   --   --  2.0 2.0  --    Liver Function Tests: Recent Labs  Lab 11/15/23 1126 11/16/23 0418  AST 41 29  ALT 28 26  ALKPHOS 81 77  BILITOT 1.0 1.2  PROT 6.1* 5.9*  ALBUMIN  3.0* 3.1*   CBG: Recent Labs  Lab 11/12/23 0832 11/12/23 1236 11/16/23 1722 11/16/23 2008 11/17/23 0834  GLUCAP 116* 144* 145* 145* 157*    Discharge time spent: greater than 30 minutes.  Signed: Verla Glaze, MD [facility] 11/17/2023

## 2023-11-17 NOTE — Plan of Care (Signed)
  Problem: Education: Goal: Knowledge of General Education information will improve Description: Including pain rating scale, medication(s)/side effects and non-pharmacologic comfort measures Outcome: Adequate for Discharge   Problem: Health Behavior/Discharge Planning: Goal: Ability to manage health-related needs will improve Outcome: Adequate for Discharge   Problem: Clinical Measurements: Goal: Ability to maintain clinical measurements within normal limits will improve Outcome: Adequate for Discharge Goal: Will remain free from infection Outcome: Adequate for Discharge Goal: Diagnostic test results will improve Outcome: Adequate for Discharge Goal: Respiratory complications will improve Outcome: Adequate for Discharge Goal: Cardiovascular complication will be avoided Outcome: Adequate for Discharge   Problem: Activity: Goal: Risk for activity intolerance will decrease Outcome: Adequate for Discharge   Problem: Nutrition: Goal: Adequate nutrition will be maintained Outcome: Adequate for Discharge   Problem: Coping: Goal: Level of anxiety will decrease Outcome: Adequate for Discharge   Problem: Elimination: Goal: Will not experience complications related to bowel motility Outcome: Adequate for Discharge Goal: Will not experience complications related to urinary retention Outcome: Adequate for Discharge   Problem: Pain Managment: Goal: General experience of comfort will improve and/or be controlled Outcome: Adequate for Discharge   Problem: Safety: Goal: Ability to remain free from injury will improve Outcome: Adequate for Discharge   Problem: Skin Integrity: Goal: Risk for impaired skin integrity will decrease Outcome: Adequate for Discharge   Problem: Education: Goal: Ability to demonstrate management of disease process will improve Outcome: Adequate for Discharge Goal: Ability to verbalize understanding of medication therapies will improve Outcome: Adequate  for Discharge Goal: Individualized Educational Video(s) Outcome: Adequate for Discharge   Problem: Activity: Goal: Capacity to carry out activities will improve Outcome: Adequate for Discharge   Problem: Cardiac: Goal: Ability to achieve and maintain adequate cardiopulmonary perfusion will improve Outcome: Adequate for Discharge   Problem: Education: Goal: Ability to describe self-care measures that may prevent or decrease complications (Diabetes Survival Skills Education) will improve Outcome: Adequate for Discharge Goal: Individualized Educational Video(s) Outcome: Adequate for Discharge   Problem: Coping: Goal: Ability to adjust to condition or change in health will improve Outcome: Adequate for Discharge   Problem: Fluid Volume: Goal: Ability to maintain a balanced intake and output will improve Outcome: Adequate for Discharge   Problem: Health Behavior/Discharge Planning: Goal: Ability to identify and utilize available resources and services will improve Outcome: Adequate for Discharge Goal: Ability to manage health-related needs will improve Outcome: Adequate for Discharge   Problem: Metabolic: Goal: Ability to maintain appropriate glucose levels will improve Outcome: Adequate for Discharge   Problem: Nutritional: Goal: Maintenance of adequate nutrition will improve Outcome: Adequate for Discharge Goal: Progress toward achieving an optimal weight will improve Outcome: Adequate for Discharge   Problem: Skin Integrity: Goal: Risk for impaired skin integrity will decrease Outcome: Adequate for Discharge   Problem: Tissue Perfusion: Goal: Adequacy of tissue perfusion will improve Outcome: Adequate for Discharge

## 2023-11-17 NOTE — Progress Notes (Signed)
 Daily Progress Note   Patient Name: Leslie Parker       Date: 11/17/2023 DOB: 11/06/1932  Age: 89 y.o. MRN#: [id number] Attending Physician: No att. providers found Primary Care Physician: Rex Castor, MD Admit Date: 11/15/2023  Reason for Consultation/Follow-up: Establishing goals of care  HPI/Brief Hospital Review: 89 y.o. female  with past medical history of HFrEF with EF 25 to 30%, SSS status post PPM, A-fib on Eliquis , hypertension, hyperlipidemia, diabetes, COPD, CKD stage IV admitted from home on 11/15/2023 with shortness of breath.   Admitted and being treated for acute respiratory failure with hypoxia secondary to acute on chronic HFrEF.  BNP found to be greater than 4500 has been started on IV diuresis.   Noted 11 inpatient admits within the last 6 months   Last admission 4/19 - 4/22 for CHF exacerbation, followed by PMT during that admission   Palliative medicine was consulted for assisting with goals of care conversations.  Subjective: Extensive chart review has been completed prior to meeting patient including labs, vital signs, imaging, progress notes, orders, and available advanced directive documents from current and previous encounters.    Visited with Leslie Parker at her bedside.  She is awake, alert and able to engage in conversation.  No family at bedside during time of visit.  Assessed symptoms.  She denies acute pain or discomfort reports some improvement in her breathing but overall continues to feel fatigued.  She again expresses her desire to return home and again confirms her desire to initiate hospice services on her return.  She shares with me she has decided on [facility] as her hospice agency.  TOC and hospice liaison engaged.  Made recommendations to  primary team to discharge home with [facility] as needed in the event she develops respiratory distress prior to initial hospice visit.  [facility] to discuss hospice services more in-depth and screen for DME needs.  Answered and addressed all questions and concerns.  Anticipate discharge home with hospice services following today.  Care plan was discussed with primary team, TOC and hospice liaison  Thank you for allowing the Palliative Medicine Team to assist in the care of this patient.  Total time:  35 minutes  Time spent includes: Detailed review of medical records (labs, imaging, vital signs), medically appropriate exam (mental status, respiratory, cardiac, skin), discussed  with treatment team, counseling and educating patient, family and staff, documenting clinical information, medication management and coordination of care.  Leslie Marble, DNP, AGNP-C Palliative Medicine   Please contact Palliative Medicine Team phone at [phone] for questions and concerns.

## 2023-11-17 NOTE — Progress Notes (Addendum)
 [facility] LIAISON NOTE   Received request from Leslie Lute, RN, Transitions of Care Manager, for hospice services at home after discharge. Spoke with patient, daughter Leslie Parker and Granddaughter at bedside to initiate education related to hospice philosophy, services, and team approach to care. Patient/family verbalized understanding of information given. Per discussion, the plan is for discharge home today by private vehicle.  DME needs discussed.  Patient has the following equipment in the home:  [facility], rollator, shower chair Patient/family requests the following equipment for delivery:  None. After discussion on oxygen and would be beneficial to have in her home and considered "comfort".  She ask hospice RN to order when she does a home assessment for other DME needs.           The address has been verified and is correct in the chart. Patient is the family contact to arrange time of equipment delivery.   Please send signed and completed DNR home with patient/family if applicable.   Please provide prescriptions at discharge as needed to ensure ongoing symptom management.   [facility] information and contact numbers given to patient.  Above information shared with Leslie Lute, RN, Transitions of Care Manager and hospital medical care team.   Please call with any hospice related questions or concerns.  Thank you for the opportunity to participate in this patient's care.   Leslie Junker, MA, BSN, RN, FNE Nurse Liaison [phone]

## 2023-11-17 NOTE — Evaluation (Signed)
 Physical Therapy Evaluation Patient Details Name: Leslie Parker MRN: [id number] DOB: 03/01/1933 Today's Date: 11/17/2023  History of Present Illness  Pt is an 89 y/o F admitted on 11/15/23 after presenting with c/o difficulty breathing. Pt is being treated for acute on chronic HFrEF, ARF with hypoxia. PMH: HFrEF, SSS, a-fib on Eliquis , HTN, HLD, DM, COPD, CKD IV, osteopenia, MI  Clinical Impression  Pt seen for PT evaluation with pt agreeable to tx. Pt reports prior to admission she was mod I with rollator in the home, denies falls, uses leg lifter to assist with sit>supine & has a lift chair. On this date, pt is able to transfer supine>sit with supervision, sit>supine with min assist. Pt transfers from EOB & elevated toilet seat with supervision, ambulates multiple laps in room with RW & supervision. Pt appears to be very close to her baseline but would benefit from acute PT services to address strengthening & endurance.         If plan is discharge home, recommend the following: A little help with walking and/or transfers;A little help with bathing/dressing/bathroom;Assistance with cooking/housework;Direct supervision/assist for medications management;Direct supervision/assist for financial management;Help with stairs or ramp for entrance;Assist for transportation   Can travel by private vehicle        Equipment Recommendations None recommended by PT  Recommendations for Other Services       Functional Status Assessment Patient has had a recent decline in their functional status and demonstrates the ability to make significant improvements in function in a reasonable and predictable amount of time.     Precautions / Restrictions Precautions Precautions: Fall Restrictions Weight Bearing Restrictions Per Provider Order: No      Mobility  Bed Mobility Overal bed mobility: Needs Assistance Bed Mobility: Supine to Sit     Supine to sit: Supervision, HOB elevated, Used rails (exits L  side of bed) Sit to supine: Min assist, Used rails, HOB elevated (assistance to elevate RLE into bed even with bed in lowest height setting)        Transfers Overall transfer level: Needs assistance Equipment used: Rolling walker (2 wheels) Transfers: Sit to/from Stand Sit to Stand: Supervision           General transfer comment: STS from EOB, elevated seat over toilet    Ambulation/Gait Ambulation/Gait assistance: Supervision Gait Distance (Feet): 115 Feet Assistive device: Rolling walker (2 wheels) Gait Pattern/deviations: Decreased step length - right, Decreased step length - left, Decreased stride length Gait velocity: decreased     General Gait Details: declines ambulating in hallway but ambulates multiple laps in room & ambulates into bathroom, no overt LOB  Stairs            Wheelchair Mobility     Tilt Bed    Modified Rankin (Stroke Patients Only)       Balance Overall balance assessment: Needs assistance Sitting-balance support: Feet supported, No upper extremity supported Sitting balance-[name]: Good     Standing balance support: During functional activity, Reliant on assistive device for balance, No upper extremity supported Standing balance-[name]: Fair Standing balance comment: standing for hand hygiene without LOB                             Pertinent Vitals/Pain Pain Assessment Pain Assessment: No/denies pain    Home Living Family/patient expects to be discharged to:: Private residence Living Arrangements: Alone Available Help at Discharge: Family;Available PRN/intermittently Type of Home: Apartment Home  Access: Level entry       Home Layout: One level Home Equipment: Rollator (4 wheels);Cane - single point;Grab bars - tub/shower;Grab bars - toilet;Lift chair;Transport chair;Tub bench;Rolling Walker (2 wheels);Shower seat;Other (comment) (leg lifter) Additional Comments: Senior living  apartment with  granddaughter able to stay with patient as needed, has call bells in multiple rooms for help if needed. Information taken from chart with pt confirming 1 level with level entry, has rollator, lift chair, leg lifter. Was receiving HHPT prior to admission.    Prior Function Prior Level of Function : Needs assist             Mobility Comments: mod I with rollator in the home, used w/c outside of the home (someone pushed pt in w/c), denies falls ADLs Comments: mod I with cooking, cleaning, bathing dressing, but reports family can/does come in occasionally to help     Extremity/Trunk Assessment   Upper Extremity Assessment Upper Extremity Assessment: Generalized weakness    Lower Extremity Assessment Lower Extremity Assessment: Generalized weakness    Cervical / Trunk Assessment Cervical / Trunk Assessment: Kyphotic  Communication   Communication Communication: No apparent difficulties    Cognition Arousal: Alert Behavior During Therapy: WFL for tasks assessed/performed   PT - Cognitive impairments: No apparent impairments                         Following commands: Intact       Cueing Cueing Techniques: Verbal cues     General Comments General comments (skin integrity, edema, etc.): Continent void on toilet, HR 75 bpm during activity    Exercises     Assessment/Plan    PT Assessment Patient needs continued PT services  PT Problem List Decreased strength;Decreased activity tolerance;Cardiopulmonary status limiting activity;Decreased mobility;Decreased balance       PT Treatment Interventions DME instruction;Balance training;Modalities;Gait training;Neuromuscular re-education;Functional mobility training;Therapeutic activities;Patient/family education;Therapeutic exercise    PT Goals (Current goals can be found in the Care Plan section)  Acute Rehab PT Goals Patient Stated Goal: go home PT Goal Formulation: With patient Time For Goal Achievement:  12/01/23 Potential to Achieve Goals: Good    Frequency Min 2X/week     Co-evaluation               AM-PAC PT "6 Clicks" Mobility  Outcome Measure Help needed turning from your back to your side while in a flat bed without using bedrails?: None Help needed moving from lying on your back to sitting on the side of a flat bed without using bedrails?: A Little Help needed moving to and from a bed to a chair (including a wheelchair)?: A Little Help needed standing up from a chair using your arms (e.g., wheelchair or bedside chair)?: A Little Help needed to walk in hospital room?: A Little Help needed climbing 3-5 steps with a railing? : A Lot 6 Click Score: 18    End of Session   Activity Tolerance: Patient tolerated treatment well Patient left: in bed;with call bell/phone within reach;with bed alarm set Nurse Communication: Mobility status PT Visit Diagnosis: Muscle weakness (generalized) (M62.81);Difficulty in walking, not elsewhere classified (R26.2)    Time: 1040-1056 PT Time Calculation (min) (ACUTE ONLY): 16 min   Charges:   PT Evaluation $PT Eval Low Complexity: 1 Low   PT General Charges $$ ACUTE PT VISIT: 1 Visit         Emaline Handsome, PT, DPT 11/17/23, 11:06 AM   Bettey Browning  Laelah Siravo 11/17/2023, 11:04 AM

## 2023-11-17 NOTE — Progress Notes (Signed)
 Patient ID: Leslie Parker, female   DOB: Jul 18, 1933, 89 y.o.   MRN: [id number] [facility]    SUBJECTIVE: Patient complains of shortness of breath dyspnea slightly improved since hospitalization patient presented with acute dyspnea shortness of breath requiring hospitalization now somewhat improved   Vitals:   11/16/23 1616 11/16/23 2051 11/17/23 0335 11/17/23 0810  BP: (!) 115/102 (!) 152/76 (!) 127/55 (!) 133/56  Pulse: 66 60 69 62  Resp: 16 16 18 18   Temp: 98 F (36.7 C) 97.7 F (36.5 C) 97.6 F (36.4 C) 98 F (36.7 C)  TempSrc:  Oral Oral Oral  SpO2: 98% 95% 93% 93%    No intake or output data in the 24 hours ending 11/17/23 1037    PHYSICAL EXAM  General: Well developed, well nourished, in no acute distress HEENT:  Normocephalic and atramatic Neck:  No JVD.  Lungs: Clear bilaterally to auscultation and percussion. Heart: HRRR . Normal S1 and S2 without gallops or murmurs.  Abdomen: Bowel sounds are positive, abdomen soft and non-tender  Msk:  Back normal, normal gait. Normal strength and tone for age. Extremities: No clubbing, cyanosis or edema.   Neuro: Alert and oriented X 3. Psych:  Good affect, responds appropriately   LABS: Basic Metabolic Panel: Recent Labs    11/15/23 2052 11/16/23 0418 11/17/23 0420  NA  --  141 137  K  --  4.0 3.7  CL  --  103 101  CO2  --  28 27  GLUCOSE  --  119* 106*  BUN  --  56* 55*  CREATININE  --  2.23* 2.45*  CALCIUM  --  8.6* 8.2*  MG 2.0 2.0  --    Liver Function Tests: Recent Labs    11/15/23 1126 11/16/23 0418  AST 41 29  ALT 28 26  ALKPHOS 81 77  BILITOT 1.0 1.2  PROT 6.1* 5.9*  ALBUMIN  3.0* 3.1*   No results for input(s): "LIPASE", "AMYLASE" in the last 72 hours. CBC: Recent Labs    11/15/23 1126 11/16/23 0418  WBC 6.4 8.6  NEUTROABS 4.7  --   HGB 11.2* 11.6*  HCT 34.9* 34.7*  MCV 96.7 94.8  PLT 213 227   Cardiac Enzymes: No results for input(s): "CKTOTAL", "CKMB", "CKMBINDEX", "TROPONINI"  in the last 72 hours. BNP: Invalid input(s): "POCBNP" D-Dimer: No results for input(s): "DDIMER" in the last 72 hours. Hemoglobin A1C: No results for input(s): "HGBA1C" in the last 72 hours. Fasting Lipid Panel: No results for input(s): "CHOL", "HDL", "LDLCALC", "TRIG", "CHOLHDL", "LDLDIRECT" in the last 72 hours. Thyroid  Function Tests: No results for input(s): "TSH", "T4TOTAL", "T3FREE", "THYROIDAB" in the last 72 hours.  Invalid input(s): "FREET3" Anemia Panel: No results for input(s): "VITAMINB12", "FOLATE", "FERRITIN", "TIBC", "IRON", "RETICCTPCT" in the last 72 hours.  DG Chest Portable 1 View Result Date: 11/15/2023 CLINICAL DATA:  Dyspnea EXAM: PORTABLE CHEST 1 VIEW COMPARISON:  11/11/2023 chest radiograph. FINDINGS: Right total shoulder arthroplasty. Stable 2 lead left subclavian pacemaker. Stable cardiomediastinal silhouette with mild cardiomegaly. No pneumothorax. Small bilateral pleural effusions are similar. Similar borderline mild pulmonary edema. Increased hazy bibasilar lung opacities. IMPRESSION: 1. Similar borderline mild pulmonary edema and small bilateral pleural effusions. 2. Increased hazy bibasilar lung opacities, favor atelectasis, difficult to exclude a component of aspiration or pneumonia. Electronically Signed   By: Levell Reach M.D.   On: 11/15/2023 11:17     Echo moderate severity depressed ventricular function globally EF around 25 to 30%  TELEMETRY: Sinus rhythm  interventricular conduction delay first-degree AV block:  ASSESSMENT AND PLAN:  Principal Problem:   Acute on chronic HFrEF (heart failure with reduced ejection fraction) (HCC) Active Problems:   Asthma, chronic   Essential hypertension   Paroxysmal atrial fibrillation (HCC)   Type II diabetes mellitus with renal manifestations (HCC)   CKD (chronic kidney disease) stage 4, GFR 15-29 ml/min (HCC)   Acute respiratory failure with hypoxia (HCC)   Acute on chronic systolic CHF (congestive heart  failure) (HCC)    Plan Acute on chronic systolic congestive heart failure recommend GDMT add Entresto Imdur  hydralazine  diuretics continue beta-blockers Consider Farxiga  and spironolactone but patient has significant renal insufficiency Chronic renal sufficiency stage IV follow-up with nephrology monitor renal function Diabetes type II continue diabetes therapy consider adding Farxiga  Hypertension recommend Entresto hydralazine  Imdur  beta-blocker consider adding spironolactone Inhalers necessary for asthma type symptoms Sick sinus syndrome permanent pacemaker in place consider escalating to CRT-D COPD by history continue inhalers follow-up with pulmonary Atrial fibrillation paroxysmal on Eliquis  for anticoagulation beta-blocker for rate control Have the patient follow-up with heart failure and cardiology within 1 week    Antonette Batters, MD, 11/17/2023 10:37 AM

## 2023-11-17 NOTE — TOC Progression Note (Signed)
 Transition of Care [facility]) - Progression Note    Patient Details  Name: Leslie Parker MRN: [id number] Date of Birth: 1932/12/07  Transition of Care [facility]) CM/SW Contact  Areta Beer, RN Phone Number: 11/17/2023, 9:22 AM  Clinical Narrative:  4/26: RN CM contacted patient this am regarding choice of hospice. Patient stated she was still undecided. Discussed potential discharge for today and would she like to speak to the in house hospice liaison from [facility], which might help answer questions and a decision, and she verbally agreed to that. Notified provider, palliative NP, and will notify hospice liaison.    Katheryn Pandy MSN RN CM  RN Case Manager [place]  Transitions of Care Direct Dial: [phone] (Weekends Only) [facility] Main Office Phone: [phone] [facility] Fax: [phone] [email or web address]     Expected Discharge Plan: Home w Home Health Services Barriers to Discharge: Continued Medical Work up  Expected Discharge Plan and Services     Post Acute Care Choice: Resumption of Svcs/PTA Provider Living arrangements for the past 2 months: Apartment Expected Discharge Date: 11/17/23                                     Social Determinants of Health (SDOH) Interventions SDOH Screenings   Food Insecurity: No Food Insecurity (11/16/2023)  Housing: Low Risk  (11/16/2023)  Transportation Needs: No Transportation Needs (11/16/2023)  Utilities: Not At Risk (11/16/2023)  Alcohol Screen: Low Risk  (08/20/2023)  Financial Resource Strain: Low Risk  (08/20/2023)  Social Connections: Moderately Integrated (11/16/2023)  Tobacco Use: Low Risk  (11/09/2023)    Readmission Risk Interventions    10/22/2023    2:28 PM 09/16/2023    2:52 PM 08/20/2023    3:45 PM  Readmission Risk Prevention Plan  Transportation Screening Complete Complete Complete  Medication Review Oceanographer) Complete Complete Complete  PCP or Specialist appointment within 3-5 days of discharge  Complete  Complete  HRI or Home Care Consult Not Complete Patient refused Complete  HRI or Home Care Consult Pt Refusal Comments No PT recs at this time.    SW Recovery Care/Counseling Consult Complete Complete Complete  Palliative Care Screening Not Applicable Not Applicable Not Applicable  Skilled Nursing Facility Not Applicable Not Applicable Not Applicable

## 2023-11-18 LAB — MISC LABCORP TEST (SEND OUT): Labcorp test code: 5367

## 2023-11-19 ENCOUNTER — Telehealth: Payer: Self-pay | Admitting: Cardiology

## 2023-11-19 DIAGNOSIS — I130 Hypertensive heart and chronic kidney disease with heart failure and stage 1 through stage 4 chronic kidney disease, or unspecified chronic kidney disease: Secondary | ICD-10-CM | POA: Diagnosis not present

## 2023-11-19 DIAGNOSIS — N184 Chronic kidney disease, stage 4 (severe): Secondary | ICD-10-CM | POA: Diagnosis not present

## 2023-11-19 DIAGNOSIS — E1122 Type 2 diabetes mellitus with diabetic chronic kidney disease: Secondary | ICD-10-CM | POA: Diagnosis not present

## 2023-11-19 DIAGNOSIS — I480 Paroxysmal atrial fibrillation: Secondary | ICD-10-CM | POA: Diagnosis not present

## 2023-11-19 DIAGNOSIS — Z09 Encounter for follow-up examination after completed treatment for conditions other than malignant neoplasm: Secondary | ICD-10-CM | POA: Diagnosis not present

## 2023-11-19 DIAGNOSIS — I5043 Acute on chronic combined systolic (congestive) and diastolic (congestive) heart failure: Secondary | ICD-10-CM | POA: Diagnosis not present

## 2023-11-19 DIAGNOSIS — Z9181 History of falling: Secondary | ICD-10-CM | POA: Diagnosis not present

## 2023-11-19 NOTE — Telephone Encounter (Signed)
 Called to confirm/remind patient of their appointment at [facility] on [facility].   Appointment:   [] Confirmed  [x] Left mess   [] No answer/No voice mail  [] VM Full/unable to leave message  [] Phone not in service  Patient reminded to bring all medications and/or complete list.  Confirmed patient has transportation. Gave directions, instructed to utilize valet parking.

## 2023-11-20 ENCOUNTER — Encounter: Admitting: Cardiology

## 2023-11-20 NOTE — Telephone Encounter (Signed)
 error

## 2023-11-22 DIAGNOSIS — N170 Acute kidney failure with tubular necrosis: Secondary | ICD-10-CM | POA: Diagnosis not present

## 2023-11-22 DIAGNOSIS — N184 Chronic kidney disease, stage 4 (severe): Secondary | ICD-10-CM | POA: Diagnosis not present

## 2023-11-22 DIAGNOSIS — I255 Ischemic cardiomyopathy: Secondary | ICD-10-CM | POA: Diagnosis not present

## 2023-11-22 DIAGNOSIS — R0609 Other forms of dyspnea: Secondary | ICD-10-CM | POA: Diagnosis not present

## 2023-11-22 DIAGNOSIS — J449 Chronic obstructive pulmonary disease, unspecified: Secondary | ICD-10-CM | POA: Diagnosis not present

## 2023-11-26 ENCOUNTER — Emergency Department

## 2023-11-26 ENCOUNTER — Other Ambulatory Visit: Payer: Self-pay

## 2023-11-26 ENCOUNTER — Observation Stay
Admission: EM | Admit: 2023-11-26 | Discharge: 2023-11-29 | Disposition: A | Discharge status: Skilled Nursing Facility | Attending: Internal Medicine | Admitting: Internal Medicine

## 2023-11-26 DIAGNOSIS — N179 Acute kidney failure, unspecified: Secondary | ICD-10-CM | POA: Diagnosis not present

## 2023-11-26 DIAGNOSIS — M19011 Primary osteoarthritis, right shoulder: Secondary | ICD-10-CM | POA: Diagnosis not present

## 2023-11-26 DIAGNOSIS — I130 Hypertensive heart and chronic kidney disease with heart failure and stage 1 through stage 4 chronic kidney disease, or unspecified chronic kidney disease: Secondary | ICD-10-CM | POA: Insufficient documentation

## 2023-11-26 DIAGNOSIS — E1129 Type 2 diabetes mellitus with other diabetic kidney complication: Secondary | ICD-10-CM | POA: Diagnosis present

## 2023-11-26 DIAGNOSIS — Y92191 Dining room in other specified residential institution as the place of occurrence of the external cause: Secondary | ICD-10-CM | POA: Insufficient documentation

## 2023-11-26 DIAGNOSIS — E1122 Type 2 diabetes mellitus with diabetic chronic kidney disease: Secondary | ICD-10-CM | POA: Diagnosis not present

## 2023-11-26 DIAGNOSIS — R590 Localized enlarged lymph nodes: Secondary | ICD-10-CM | POA: Diagnosis not present

## 2023-11-26 DIAGNOSIS — G9389 Other specified disorders of brain: Secondary | ICD-10-CM | POA: Diagnosis not present

## 2023-11-26 DIAGNOSIS — I5020 Unspecified systolic (congestive) heart failure: Secondary | ICD-10-CM | POA: Diagnosis not present

## 2023-11-26 DIAGNOSIS — Z471 Aftercare following joint replacement surgery: Secondary | ICD-10-CM | POA: Diagnosis not present

## 2023-11-26 DIAGNOSIS — Z79899 Other long term (current) drug therapy: Secondary | ICD-10-CM | POA: Diagnosis not present

## 2023-11-26 DIAGNOSIS — M25511 Pain in right shoulder: Secondary | ICD-10-CM | POA: Diagnosis present

## 2023-11-26 DIAGNOSIS — W1830XA Fall on same level, unspecified, initial encounter: Secondary | ICD-10-CM | POA: Insufficient documentation

## 2023-11-26 DIAGNOSIS — N184 Chronic kidney disease, stage 4 (severe): Secondary | ICD-10-CM | POA: Diagnosis not present

## 2023-11-26 DIAGNOSIS — Z96611 Presence of right artificial shoulder joint: Secondary | ICD-10-CM | POA: Diagnosis not present

## 2023-11-26 DIAGNOSIS — S42101A Fracture of unspecified part of scapula, right shoulder, initial encounter for closed fracture: Principal | ICD-10-CM | POA: Insufficient documentation

## 2023-11-26 DIAGNOSIS — I5022 Chronic systolic (congestive) heart failure: Secondary | ICD-10-CM | POA: Insufficient documentation

## 2023-11-26 DIAGNOSIS — M79601 Pain in right arm: Secondary | ICD-10-CM | POA: Diagnosis not present

## 2023-11-26 DIAGNOSIS — S199XXA Unspecified injury of neck, initial encounter: Secondary | ICD-10-CM | POA: Diagnosis not present

## 2023-11-26 DIAGNOSIS — Z043 Encounter for examination and observation following other accident: Secondary | ICD-10-CM | POA: Diagnosis not present

## 2023-11-26 DIAGNOSIS — J449 Chronic obstructive pulmonary disease, unspecified: Secondary | ICD-10-CM | POA: Diagnosis not present

## 2023-11-26 DIAGNOSIS — M25519 Pain in unspecified shoulder: Secondary | ICD-10-CM | POA: Diagnosis not present

## 2023-11-26 DIAGNOSIS — S0990XA Unspecified injury of head, initial encounter: Secondary | ICD-10-CM | POA: Diagnosis not present

## 2023-11-26 DIAGNOSIS — I10 Essential (primary) hypertension: Secondary | ICD-10-CM | POA: Diagnosis not present

## 2023-11-26 DIAGNOSIS — R9089 Other abnormal findings on diagnostic imaging of central nervous system: Secondary | ICD-10-CM | POA: Diagnosis not present

## 2023-11-26 DIAGNOSIS — S42109A Fracture of unspecified part of scapula, unspecified shoulder, initial encounter for closed fracture: Secondary | ICD-10-CM | POA: Diagnosis present

## 2023-11-26 DIAGNOSIS — Z789 Other specified health status: Secondary | ICD-10-CM

## 2023-11-26 LAB — BASIC METABOLIC PANEL WITH GFR
Anion gap: 11 (ref 5–15)
BUN: 69 mg/dL — ABNORMAL HIGH (ref 8–23)
CO2: 25 mmol/L (ref 22–32)
Calcium: 8.5 mg/dL — ABNORMAL LOW (ref 8.9–10.3)
Chloride: 100 mmol/L (ref 98–111)
Creatinine, Ser: 2.6 mg/dL — ABNORMAL HIGH (ref 0.44–1.00)
GFR, Estimated: 17 mL/min — ABNORMAL LOW (ref >60)
Glucose, Bld: 138 mg/dL — ABNORMAL HIGH (ref 70–99)
Potassium: 3.4 mmol/L — ABNORMAL LOW (ref 3.5–5.1)
Sodium: 136 mmol/L (ref 135–145)

## 2023-11-26 LAB — CBC
HCT: 34.2 % — ABNORMAL LOW (ref 36.0–46.0)
Hemoglobin: 11.3 g/dL — ABNORMAL LOW (ref 12.0–15.0)
MCH: 31.5 pg (ref 26.0–34.0)
MCHC: 33 g/dL (ref 30.0–36.0)
MCV: 95.3 fL (ref 80.0–100.0)
Platelets: 224 10*3/uL (ref 150–400)
RBC: 3.59 MIL/uL — ABNORMAL LOW (ref 3.87–5.11)
RDW: 15.9 % — ABNORMAL HIGH (ref 11.5–15.5)
WBC: 5.8 10*3/uL (ref 4.0–10.5)
nRBC: 0 % (ref 0.0–0.2)

## 2023-11-26 MED ORDER — SACUBITRIL-VALSARTAN 24-26 MG PO TABS
1.0000 | ORAL_TABLET | Freq: Two times a day (BID) | ORAL | Status: DC
  Administered 2023-11-26 – 2023-11-28 (×4): 1 via ORAL
  Filled 2023-11-26 (×6): qty 1

## 2023-11-26 MED ORDER — BUDESON-GLYCOPYRROL-FORMOTEROL 160-9-4.8 MCG/ACT IN AERO
2.0000 | INHALATION_SPRAY | Freq: Two times a day (BID) | RESPIRATORY_TRACT | Status: DC
  Administered 2023-11-27 – 2023-11-29 (×5): 2 via RESPIRATORY_TRACT
  Filled 2023-11-26: qty 5.9

## 2023-11-26 MED ORDER — ACETAMINOPHEN 500 MG PO TABS
1000.0000 mg | ORAL_TABLET | Freq: Three times a day (TID) | ORAL | Status: DC
  Administered 2023-11-26 – 2023-11-29 (×8): 1000 mg via ORAL
  Filled 2023-11-26 (×8): qty 2

## 2023-11-26 MED ORDER — ONDANSETRON HCL 4 MG/2ML IJ SOLN
4.0000 mg | Freq: Once | INTRAMUSCULAR | Status: AC
  Administered 2023-11-26: 4 mg via INTRAVENOUS
  Filled 2023-11-26: qty 2

## 2023-11-26 MED ORDER — SODIUM CHLORIDE 0.9% FLUSH
3.0000 mL | Freq: Two times a day (BID) | INTRAVENOUS | Status: DC
  Administered 2023-11-26 – 2023-11-29 (×6): 3 mL via INTRAVENOUS

## 2023-11-26 MED ORDER — ORAL CARE MOUTH RINSE: 15.0000 mL | OROMUCOSAL | Status: DC | PRN

## 2023-11-26 MED ORDER — MORPHINE SULFATE (PF) 2 MG/ML IV SOLN
2.0000 mg | Freq: Once | INTRAVENOUS | Status: AC
  Administered 2023-11-26: 2 mg via INTRAVENOUS
  Filled 2023-11-26: qty 1

## 2023-11-26 MED ORDER — VITAMIN D 25 MCG (1000 UNIT) PO TABS
1000.0000 [IU] | ORAL_TABLET | Freq: Every day | ORAL | Status: DC
  Administered 2023-11-27 – 2023-11-29 (×3): 1000 [IU] via ORAL
  Filled 2023-11-26 (×3): qty 1

## 2023-11-26 MED ORDER — IPRATROPIUM-ALBUTEROL 0.5-2.5 (3) MG/3ML IN SOLN: 3.0000 mL | Freq: Four times a day (QID) | RESPIRATORY_TRACT | Status: DC | PRN

## 2023-11-26 MED ORDER — APIXABAN 2.5 MG PO TABS
2.5000 mg | ORAL_TABLET | Freq: Two times a day (BID) | ORAL | Status: DC
  Administered 2023-11-26 – 2023-11-29 (×6): 2.5 mg via ORAL
  Filled 2023-11-26 (×6): qty 1

## 2023-11-26 MED ORDER — TORSEMIDE 20 MG PO TABS
20.0000 mg | ORAL_TABLET | Freq: Every day | ORAL | Status: DC
  Administered 2023-11-27 – 2023-11-29 (×3): 20 mg via ORAL
  Filled 2023-11-26 (×3): qty 1

## 2023-11-26 MED ORDER — ONDANSETRON HCL 4 MG PO TABS: 4.0000 mg | ORAL_TABLET | Freq: Four times a day (QID) | ORAL | Status: DC | PRN

## 2023-11-26 MED ORDER — ACETAMINOPHEN 325 MG PO TABS: 650.0000 mg | ORAL_TABLET | Freq: Four times a day (QID) | ORAL | Status: DC | PRN

## 2023-11-26 MED ORDER — POLYETHYLENE GLYCOL 3350 17 G PO PACK: 17.0000 g | PACK | Freq: Every day | ORAL | Status: DC | PRN

## 2023-11-26 MED ORDER — HYDROMORPHONE HCL 1 MG/ML IJ SOLN
0.5000 mg | INTRAMUSCULAR | Status: DC | PRN
  Administered 2023-11-26 – 2023-11-27 (×3): 0.5 mg via INTRAVENOUS
  Filled 2023-11-26 (×3): qty 0.5

## 2023-11-26 MED ORDER — CARVEDILOL 3.125 MG PO TABS
6.2500 mg | ORAL_TABLET | Freq: Two times a day (BID) | ORAL | Status: DC
  Administered 2023-11-27 – 2023-11-28 (×3): 6.25 mg via ORAL
  Filled 2023-11-26 (×3): qty 2

## 2023-11-26 MED ORDER — ACETAMINOPHEN 650 MG RE SUPP: 650.0000 mg | Freq: Four times a day (QID) | RECTAL | Status: DC | PRN

## 2023-11-26 MED ORDER — OXYCODONE-ACETAMINOPHEN 5-325 MG PO TABS
1.0000 | ORAL_TABLET | Freq: Once | ORAL | Status: AC
  Administered 2023-11-26: 1 via ORAL
  Filled 2023-11-26: qty 1

## 2023-11-26 MED ORDER — ONDANSETRON HCL 4 MG/2ML IJ SOLN
4.0000 mg | Freq: Four times a day (QID) | INTRAMUSCULAR | Status: DC | PRN
  Administered 2023-11-27: 4 mg via INTRAVENOUS
  Filled 2023-11-26: qty 2

## 2023-11-26 MED ORDER — AMIODARONE HCL 200 MG PO TABS
200.0000 mg | ORAL_TABLET | Freq: Every day | ORAL | Status: DC
  Administered 2023-11-27 – 2023-11-29 (×3): 200 mg via ORAL
  Filled 2023-11-26 (×3): qty 1

## 2023-11-26 MED ORDER — OXYCODONE HCL 5 MG PO TABS
5.0000 mg | ORAL_TABLET | Freq: Three times a day (TID) | ORAL | Status: DC | PRN
  Administered 2023-11-27 (×2): 5 mg via ORAL
  Filled 2023-11-26 (×2): qty 1

## 2023-11-26 NOTE — Assessment & Plan Note (Signed)
 Patient is presenting with a right scapular fracture after a mechanical ground-level fall while trying to rush to the restroom.  Orthopedic surgery recommending sling and pain control; no indication for surgical intervention.  Given patient walks with a walker at baseline, she will likely require SNF, although she has declined this in the past.  - Pain control with Tylenol , oxycodone , Dilaudid  - Continuous pulse oximetry while receiving opioids - PT/OT - Sling ordered

## 2023-11-26 NOTE — ED Provider Notes (Signed)
 [facility] Emergency Department Provider Note     Event Date/Time   First MD Initiated Contact with Patient 11/26/23 1534     (approximate)   History   Fall   HPI  Leslie Parker is a 89 y.o. female with a history of diabetes, HTN, asthma, osteopenia, osteoarthritis, A-fib and CHF presents to the ED following a mechanical fall sustaining right shoulder pain.  Patient reports she was rushing to the restroom when she tripped over her walker.  She reports she fell directly onto her right shoulder.  She denies head injury or LOC.  She endorses being on anticoagulation.  No other complaints.     Physical Exam   Triage Vital Signs: ED Triage Vitals  Encounter Vitals Group     BP 11/26/23 1406 116/75     Systolic BP Percentile --      Diastolic BP Percentile --      Pulse Rate 11/26/23 1406 63     Resp 11/26/23 1406 17     Temp 11/26/23 1406 (!) 97.5 F (36.4 C)     Temp Source 11/26/23 1406 Oral     SpO2 11/26/23 1406 98 %     Weight 11/26/23 1408 156 lb 4.9 oz (70.9 kg)     Height 11/26/23 1408 5\' 9"  (1.753 m)     Head Circumference --      Peak Flow --      Pain Score 11/26/23 1408 7     Pain Loc --      Pain Education --      Exclude from Growth Chart --     Most recent vital signs: Vitals:   11/26/23 1700 11/26/23 1730  BP: (!) 146/69 (!) 151/65  Pulse: 60 62  Resp: 13 16  Temp:    SpO2:  97%    General Awake, no distress.  HEENT NCAT. PERRL. EOMI.  CV:  Good peripheral perfusion. RRR RESP:  Normal effort. LCTAB ABD:  No distention.  Other:  Right shoulder reveals no visible deformity.  Tenderness with palpation over right clavicle and proximal humerus.  Absent active range of motion secondary to pain.  Neurovascular status intact all throughout.  Full range of motion of all digits.  Abrasion noted over the lateral aspect of mid humerus.    ED Results / Procedures / Treatments   Labs (all labs ordered are listed, but only  abnormal results are displayed) Labs Reviewed  CBC - Abnormal; Notable for the following components:      Result Value   RBC 3.59 (*)    Hemoglobin 11.3 (*)    HCT 34.2 (*)    RDW 15.9 (*)    All other components within normal limits  BASIC METABOLIC PANEL WITH GFR - Abnormal; Notable for the following components:   Potassium 3.4 (*)    Glucose, Bld 138 (*)    BUN 69 (*)    Creatinine, Ser 2.60 (*)    Calcium 8.5 (*)    GFR, Estimated 17 (*)    All other components within normal limits   RADIOLOGY  I personally viewed and evaluated these images as part of my medical decision making, as well as reviewing the written report by the radiologist.  ED Provider Interpretation: No acute abnormalities of head and cervical spine CT  Nondisplaced fracture of the right inferior scapula.  CT Shoulder Right Wo Contrast Result Date: 11/26/2023 CLINICAL DATA:  Axillary lymphadenopathy is seen. EXAM: CT OF THE  UPPER RIGHT EXTREMITY WITHOUT CONTRAST TECHNIQUE: Multidetector CT imaging of the upper right extremity was performed according to the standard protocol. RADIATION DOSE REDUCTION: This exam was performed according to the departmental dose-optimization program which includes automated exposure control, adjustment of the mA and/or kV according to patient size and/or use of iterative reconstruction technique. COMPARISON:  Right shoulder and humerus radiographs 11/26/2023 FINDINGS: Bones/Joint/Cartilage On today's radiographs, there is question of mild cortical step-off at the inferior aspect of the scapular body, near the inferior angle. On the current CT, linear lucency and mild angulation is seen in this region (axial series 3 images 52 through 66 and coronal series 7 images 84 through 90), this is near the region of the serratus anterior attachment on the deep aspect of the scapula and the teres minor attachment on the superficial/posterior aspect of the inferior scapular angle. There is mild  surrounding subcutaneous fat edema and stranding. Postsurgical changes are seen of reverse total right shoulder arthroplasty. Within the limitation of metallic streak artifact, no perihardware lucency is seen to indicate hardware failure or loosening. Moderate to severe acromioclavicular joint space narrowing with subchondral sclerosis and moderate peripheral osteophytosis. Multiple chronic well corticated superior ossicles. Type III acromion, with mild downsloping of the anterolateral acromion. Ligaments Suboptimally assessed by CT. Muscles and Tendons There is moderate to high-grade supraspinatus and mild-to-moderate infraspinatus muscle atrophy. Mild subscapularis muscle atrophy. Soft tissues No axillary lymphadenopathy is seen. IMPRESSION: 1. An area of interest on the prior radiographs, there is linear lucency and mild angulation at the inferior angle of the scapular body, near the region of the serratus anterior attachment on the deep aspect of the scapula. This suggests a nondisplaced fracture. 2. Postsurgical changes of reverse total right shoulder arthroplasty. No evidence of hardware failure or loosening. 3. 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 4. Moderate to high-grade supraspinatus and mild-to-moderate infraspinatus muscle atrophy. Mild subscapularis muscle atrophy. Electronically Signed   By: Bertina Broccoli M.D.   On: 11/26/2023 17:24   CT Head Wo Contrast Result Date: 11/26/2023 CLINICAL DATA:  Unwitnessed fall.  Trauma to the head and neck EXAM: CT HEAD WITHOUT CONTRAST CT CERVICAL SPINE WITHOUT CONTRAST TECHNIQUE: Multidetector CT imaging of the head and cervical spine was performed following the standard protocol without intravenous contrast. Multiplanar CT image reconstructions of the cervical spine were also generated. RADIATION DOSE REDUCTION: This exam was performed according to the departmental dose-optimization program which includes automated exposure control, adjustment of  the mA and/or kV according to patient size and/or use of iterative reconstruction technique. COMPARISON:  None Available. FINDINGS: CT HEAD FINDINGS Brain: Age related volume loss. No focal abnormality affects the brainstem or cerebellum. There is chronic encephalomalacia of the right temporal lobe, probably due to an old stroke. This could possibly relate prior head trauma. Cerebral hemispheres otherwise show chronic small-vessel ischemic change of the white matter. No recent stroke, mass, hemorrhage, hydrocephalus or extra-axial collection. Vascular: There is atherosclerotic calcification of the major vessels at the base of the brain. Skull: Negative Sinuses/Orbits: Clear/normal Other: None CT CERVICAL SPINE FINDINGS Alignment: Cervicothoracic curvature convex to the left. Degenerative anterolisthesis at C6-7 of 3 mm. Skull base and vertebrae: No acute fracture. Chronic fusion at the C2-3 level. Chronic fusion at the C4-5 level. Soft tissues and spinal canal: No traumatic soft tissue finding. Multiple thyroid  nodules and/or cysts, the largest measuring 3 cm. Normally, thyroid  ultrasound would be recommended in this setting. However, because of the patient's age and comorbidities, follow-up may not be necessary.  The likelihood of malignant disease is low. Disc levels: The foramen magnum is widely patent. There is ordinary mild osteoarthritis of the C1-2 articulation but no encroachment upon the neural structures. C2-3: Chronic fusion.  Sufficient patency of the canal and foramina. C3-4: Mobile interspace. Endplate osteophytes. Facet osteophytes. Mild canal stenosis. Bilateral foraminal narrowing that could cause neural compression. C4-5: Chronic fusion.  Sufficient patency of the canal and foramina. C5-6: Mobile level. Endplate osteophytes and facet osteophytes worse on the right. Right foraminal stenosis could compress the right C6 nerve. C6-7: Chronic facet arthropathy worse on the right with anterolisthesis of 3  mm. No central canal stenosis. Foraminal narrowing on the right that could affect the C7 nerve. C7-T1: Facet arthritis on the right. 1-2 mm of degenerative anterolisthesis. Mild foraminal narrowing on the right. Upper chest: Negative Other: None IMPRESSION: HEAD CT: No acute or traumatic finding. Chronic encephalomalacia of the right temporal lobe, probably due to an old stroke. This could possibly relate to prior head trauma. CERVICAL SPINE CT: 1. No acute or traumatic finding. Chronic fusion at C2-3 and C4-5. Degenerative changes at the other levels as outlined above. 2. Multiple thyroid  nodules and/or cysts, the largest measuring 3 cm. Normally, thyroid  ultrasound would be recommended in this setting. However, because of the patient's age and comorbidities, this may not be necessary. The likelihood of malignant disease is low. Electronically Signed   By: Bettylou Brunner M.D.   On: 11/26/2023 17:04   CT Cervical Spine Wo Contrast Result Date: 11/26/2023 CLINICAL DATA:  Unwitnessed fall.  Trauma to the head and neck EXAM: CT HEAD WITHOUT CONTRAST CT CERVICAL SPINE WITHOUT CONTRAST TECHNIQUE: Multidetector CT imaging of the head and cervical spine was performed following the standard protocol without intravenous contrast. Multiplanar CT image reconstructions of the cervical spine were also generated. RADIATION DOSE REDUCTION: This exam was performed according to the departmental dose-optimization program which includes automated exposure control, adjustment of the mA and/or kV according to patient size and/or use of iterative reconstruction technique. COMPARISON:  None Available. FINDINGS: CT HEAD FINDINGS Brain: Age related volume loss. No focal abnormality affects the brainstem or cerebellum. There is chronic encephalomalacia of the right temporal lobe, probably due to an old stroke. This could possibly relate prior head trauma. Cerebral hemispheres otherwise show chronic small-vessel ischemic change of the white  matter. No recent stroke, mass, hemorrhage, hydrocephalus or extra-axial collection. Vascular: There is atherosclerotic calcification of the major vessels at the base of the brain. Skull: Negative Sinuses/Orbits: Clear/normal Other: None CT CERVICAL SPINE FINDINGS Alignment: Cervicothoracic curvature convex to the left. Degenerative anterolisthesis at C6-7 of 3 mm. Skull base and vertebrae: No acute fracture. Chronic fusion at the C2-3 level. Chronic fusion at the C4-5 level. Soft tissues and spinal canal: No traumatic soft tissue finding. Multiple thyroid  nodules and/or cysts, the largest measuring 3 cm. Normally, thyroid  ultrasound would be recommended in this setting. However, because of the patient's age and comorbidities, follow-up may not be necessary. The likelihood of malignant disease is low. Disc levels: The foramen magnum is widely patent. There is ordinary mild osteoarthritis of the C1-2 articulation but no encroachment upon the neural structures. C2-3: Chronic fusion.  Sufficient patency of the canal and foramina. C3-4: Mobile interspace. Endplate osteophytes. Facet osteophytes. Mild canal stenosis. Bilateral foraminal narrowing that could cause neural compression. C4-5: Chronic fusion.  Sufficient patency of the canal and foramina. C5-6: Mobile level. Endplate osteophytes and facet osteophytes worse on the right. Right foraminal stenosis could compress the  right C6 nerve. C6-7: Chronic facet arthropathy worse on the right with anterolisthesis of 3 mm. No central canal stenosis. Foraminal narrowing on the right that could affect the C7 nerve. C7-T1: Facet arthritis on the right. 1-2 mm of degenerative anterolisthesis. Mild foraminal narrowing on the right. Upper chest: Negative Other: None IMPRESSION: HEAD CT: No acute or traumatic finding. Chronic encephalomalacia of the right temporal lobe, probably due to an old stroke. This could possibly relate to prior head trauma. CERVICAL SPINE CT: 1. No acute  or traumatic finding. Chronic fusion at C2-3 and C4-5. Degenerative changes at the other levels as outlined above. 2. Multiple thyroid  nodules and/or cysts, the largest measuring 3 cm. Normally, thyroid  ultrasound would be recommended in this setting. However, because of the patient's age and comorbidities, this may not be necessary. The likelihood of malignant disease is low. Electronically Signed   By: Bettylou Brunner M.D.   On: 11/26/2023 17:04   DG Chest 1 View Result Date: 11/26/2023 CLINICAL DATA:  Status post fall EXAM: CHEST  1 VIEW COMPARISON:  November 15, 2023 FINDINGS: The heart size and mediastinal contours are within normal limits. Both lungs are clear. The visualized skeletal structures are unremarkable. 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   On: 11/26/2023 15:48   DG Shoulder Right Result Date: 11/26/2023 CLINICAL DATA:  Fall. EXAM: RIGHT SHOULDER - 2+ VIEW COMPARISON:  None Available. FINDINGS: Status post right total shoulder arthroplasty. No dislocation is noted. Possible mildly displaced fracture involving inferior portion of right scapula. IMPRESSION: Possible mildly displaced right scapular fracture as noted above. Electronically Signed   By: Rosalene Colon M.D.   On: 11/26/2023 15:44   DG Humerus Right Result Date: 11/26/2023 CLINICAL DATA:  Right arm pain after fall. EXAM: RIGHT HUMERUS - 2+ VIEW COMPARISON:  None Available. FINDINGS: Status post right shoulder arthroplasty. No fracture or dislocation is noted. IMPRESSION: No acute abnormality seen. Electronically Signed   By: Rosalene Colon M.D.   On: 11/26/2023 15:42    PROCEDURES:  Critical Care performed: No  Procedures   MEDICATIONS ORDERED IN ED: Medications   oxyCODONE -acetaminophen  (PERCOCET/ROXICET) 5-325 MG per tablet 1 tablet (1 tablet Oral Given 11/26/23 1533)  morphine  (PF) 2 MG/ML injection 2 mg (2 mg Intravenous Given 11/26/23 1719)  ondansetron  (ZOFRAN ) injection 4 mg (4 mg Intravenous Given 11/26/23 1719)     IMPRESSION / MDM / ASSESSMENT AND PLAN / ED COURSE  I reviewed the triage vital signs and the nursing notes.                               89 y.o. female presents to the emergency department for evaluation and treatment of right shoulder pain following mechanical fall. See HPI for further details.   Differential diagnosis includes, but is not limited to fracture, dislocation, contusion, abrasion  Patient's presentation is most consistent with acute complicated illness / injury requiring diagnostic workup.  Patient is alert and oriented.  She is hemodynamic stable.  Physical exam findings are stated above.  Concerning for absence of active range of motion of right shoulder.  X-ray revealed possibly displaced scapular.  CT scan to confirm this revealed nondisplaced of the inferior scapular body.  Head CT and cervical spine CT reassuring.  Consult to orthopedics and discussed with Dr. Clyda Dark who recommended placing patient in a sling as there is no surgical intervention needed with this type of fracture.  ED pain management with Percocet and morphine .  Will admit for pain control and observation and possible PT/OT as patient lives alone.  Discussed with hospitalist who has accepted patient for admission.  Patient is in stable condition at this time.   FINAL CLINICAL IMPRESSION(S) / ED DIAGNOSES   Final diagnoses:  Closed fracture of right scapula, unspecified part of scapula, initial encounter    Rx / DC Orders   ED Discharge Orders     None        Note:  This document was prepared using Dragon voice recognition software and may include unintentional dictation errors.    Phyllis Breeze, Kairyn Olmeda A, PA-C 11/26/23 Selene Dais, MD 11/26/23 [phone]

## 2023-11-26 NOTE — Progress Notes (Signed)
 Established Patient Visit   Chief Complaint: No chief complaint on file.  Date of Service: 11/26/2023 Date of Birth: Dec 09, 1932 PCP: Sherial Bail, MD  History of Present Illness: Leslie Parker is a 89 y.o.female patient who history of hypertension who was recently hospitalized for diverticulitis.  She also has bradycardia with a existing permanent pacemaker.  During the hospitalization for diverticulitis she was found to have a decrease in ejection fraction with a left ventricular EF of 30% and left ventricular dimension of 5 cm.  There was moderate mitral regurgitation.  She responded well to diuresis and there was some modifications made in her medical regimen.    6 hosp since last summer. Dc one week ago with plan for hospice but no communication with grand daughter. For now plan to continue treatment. Pall care set up for online visit    Past Medical and Surgical History  Past Medical History Past Medical History:  Diagnosis Date  . Asthma, unspecified asthma severity, unspecified whether complicated, unspecified whether persistent (HHS-HCC)   . COPD (chronic obstructive pulmonary disease) (CMS/HHS-HCC)   . Diabetes mellitus without complication (CMS/HHS-HCC)   . Hypertension   . Hypoxia   . Osteoarthritis   . Osteopenia     Past Surgical History She has a past surgical history that includes cholecystectomy; Hysterectomy Vaginal; Cholecystectomy; Hernia repair; Total shoulder replacement (Left); Replacement total knee (Left); Replacement total knee (Right); Replacement total hip w/  resurfacing implants (Left); Insert / replace / remove pacemaker (11/15/2022); and Reduction and internal fixation of displaced intertrochanteric right hip fracture with [employer] TFN nail (Right, 02/13/2023).   Medications and Allergies  Current Medications  Current Outpatient Medications  Medication Sig Dispense Refill  . ACCU-CHEK GUIDE TEST STRIPS test strip TEST BLOOD SUGAR THREE TIMES DAILY  USE AS INSTRUCTED 300 strip 10  . ACCU-CHEK SOFTCLIX LANCETS lancets TEST THREE TIMES DAILY  AS  INSTRUCTED 300 each 3  . acetaminophen  (TYLENOL ) 325 MG tablet Take 650 mg by mouth    . albuterol  MDI, PROVENTIL , VENTOLIN , PROAIR , HFA 90 mcg/actuation inhaler Inhale 2 inhalations into the lungs every 6 (six) hours as needed for Wheezing or Shortness of Breath 3 each 2  . AMIOdarone  (PACERONE ) 200 MG tablet TAKE 1 TABLET BY MOUTH EVERY DAY 90 tablet 0  . apixaban  (ELIQUIS ) 2.5 mg tablet Take 1 tablet (2.5 mg total) by mouth 2 (two) times daily 180 tablet 3  . ascorbic acid , vitamin C , (VITAMIN C ) 1000 MG tablet Take 1,000 mg by mouth once daily    . blood-glucose calibrat control Cmpk Use as directed. 1 each 5  . carvediloL  (COREG ) 6.25 MG tablet TAKE 1 TABLET BY MOUTH 2 TIMES DAILY WITH A MEAL.    . cholecalciferol  (VITAMIN D3) 1000 unit tablet Take 1,000 Units by mouth once daily    . cyanocobalamin  (VITAMIN B12) 1000 MCG tablet Take 1,000 mcg by mouth once daily    . fluticasone -umeclidinium-vilanterol (TRELEGY ELLIPTA) 100-62.5-25 mcg inhaler Inhale 1 Puff into the lungs once daily 60 each 5  . hydrALAZINE  (APRESOLINE ) 25 MG tablet TAKE 1 TABLET (25 MG TOTAL) BY MOUTH 3 (THREE) TIMES DAILY AS NEEDED (FOR SBP >150 CONISTENTLY OR DBP>100 PERSISTENTLY) 270 tablet 1  . multivitamin tablet Take 1 tablet by mouth once daily    . omeprazole (PRILOSEC) 40 MG DR capsule Take 1 capsule (40 mg total) by mouth 2 (two) times daily before meals 180 capsule 1  . ondansetron  (ZOFRAN ) 4 MG tablet Take 4 mg by mouth  every 6 (six) hours as needed    . sacubitriL -valsartan  (ENTRESTO ) 24-26 mg tablet Take 1 tablet by mouth 2 (two) times daily    . TORsemide  (DEMADEX ) 20 MG tablet Take 20 mg by mouth    . ipratropium-albuteroL  (DUO-NEB) nebulizer solution TAKE 3 MLS BY NEBULIZATION 3 (THREE) TIMES DAILY FOR 30 DAYS 270 mL 0   No current facility-administered medications for this visit.    Allergies: Baclofen and  Simvastatin  Social and Family History  Social History  reports that she has never smoked. She has never used smokeless tobacco. She reports that she does not currently use alcohol. She reports that she does not use drugs.  Family History Family History  Problem Relation Name Age of Onset  . High blood pressure (Hypertension) Mother    . Heart failure Mother    . Emphysema Father    . No Known Problems Sister    . Stroke Brother    . No Known Problems Sister      Review of Systems   Review of Systems: The patient denies chest pain, shortness of breath, orthopnea, paroxysmal nocturnal dyspnea,  palpitations, heart racing, presyncope, syncope. Review of 10 Systems is negative except as described above.  Physical Examination   Vitals:BP 130/66   Pulse 70   Ht 175.3 cm (5' 9)   Wt 68 kg (150 lb)   SpO2 97%   BMI 22.15 kg/m  Ht:175.3 cm (5' 9) Wt:68 kg (150 lb) ADJ:Anib surface area is 1.82 meters squared. Body mass index is 22.15 kg/m.  HEENT: sclera anicteric Lungs: clear to auscultation bilaterally Heart: Regular rate and rhythm.  MR murmur Abdomen: soft  Extremities: no edema, normal pulses     Plan  HFrEF: Euvolemic today.  Plan to continue on current GDMT Start jardiance . Not worried about renal function  Will prescribe HFAM device for remote monitoring  Continue follow up with Pall care. Will do 6 min wlak to see if O2 drops and she qualities for supplemental O2 at home   CKD: Part of cardiorenal syndrome  RTC in 1 m      No follow-ups on file.  CHERON HARBOR, MD

## 2023-11-26 NOTE — Assessment & Plan Note (Signed)
 Patient has a history of HFrEF with last EF of 25-30%.  She follows with [facility], who she saw earlier today.  She appears essentially euvolemic on examination.  - Continue home regimen

## 2023-11-26 NOTE — H&P (Signed)
 History and Physical    Patient: Leslie Parker ZOX:[id number] DOB: 03-16-1933 DOA: 11/26/2023 DOS: the patient was seen and examined on 11/26/2023 PCP: Leslie Castor, MD  Patient coming from: Home  Chief Complaint:  Chief Complaint  Patient presents with   Fall   HPI: Leslie Parker is a 89 y.o. female with medical history significant of chronic HFrEF with last EF of 25-30%, SSS s/p PPM, atrial fibrillation on Eliquis , COPD, hypertension, hyperlipidemia, CKD stage IV, osteopenia, who presents to the ED due to ground-level fall.  Mrs. Leslie Parker states that she was using her walker to quickly run to the bathroom as she had to have a bowel movement and felt as though she may go on herself.  She tried to spin around quickly to prepare to sit down on the commode and lost her footing, falling and landing on her right shoulder.  She denies any chest pain, dizziness or shortness of breath prior to the fall or afterward.  Since the fall, she has been experiencing severe right shoulder pain.  Mrs. Leslie Parker states that otherwise, she has been feeling very well with no respiratory symptoms.  She saw her advanced heart failure team earlier today.  She notes that she frequently has diarrhea when she takes her medications without eating but did not have time to eat because of the appointment.  ED course: On arrival to the ED, patient was hypertensive at 149/67 with heart rate of 60.  She was saturating at 100% on room air.  She was afebrile at 97.5.  Initial workup notable for hemoglobin of 11.3, potassium 3.4, creatinine 2.60 with GFR 17.  CT head, C-spine, chest x-ray, right humerus and right shoulder x-ray all negative.  CT of the shoulder subsequently obtained with evidence of nondisplaced fracture of the scapula.  EDP discussed with orthopedic surgery who is recommending pain control and observation with sling placement.  No indication for surgery.  [facility] contacted for admission.  Review of Systems: As mentioned  in the history of present illness. All other systems reviewed and are negative.  Past Medical History:  Diagnosis Date   Acute kidney injury superimposed on CKD (HCC) 11/24/2022   Asthma    Atrial fibrillation with RVR (HCC)    Cancer (HCC)    Basal Cell   CHF (congestive heart failure) (HCC)    Closed left hip fracture (HCC) 01/25/2017   Closed right hip fracture (HCC) 02/13/2023   Diabetes mellitus without complication (HCC)    Femur fracture, left (HCC) 02/03/2015   Heart murmur    Hypertension    Impetigo    Osteoarthritis    Osteopenia    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   Rapid atrial fibrillation, new onset(HCC) 06/05/2022   Vomiting    can not due to surgery   Wears dentures    full upper and lower   Past Surgical History:  Procedure Laterality Date   ABDOMINAL HYSTERECTOMY     BACK SURGERY     BLADDER SURGERY     mesh   CARPAL TUNNEL RELEASE Bilateral    CATARACT EXTRACTION Right 2017   CATARACT EXTRACTION W/PHACO Left 02/06/2016   Procedure: CATARACT EXTRACTION PHACO AND INTRAOCULAR LENS PLACEMENT (IOC) left eye;  Surgeon: Billee Buddle, MD;  Location: [facility];  Service: Ophthalmology;  Laterality: Left;  DIABETIC LEFT Cannot arrive before 9:30   CERVICAL DISC SURGERY     CHOLECYSTECTOMY     EYE SURGERY Bilateral    Cataract Extraction with IOL  FEMUR IM NAIL Left 02/04/2015   Procedure: INTRAMEDULLARY (IM) NAIL FEMORAL;  Surgeon: Rande Bushy, MD;  Location: [facility];  Service: Orthopedics;  Laterality: Left;   HARDWARE REMOVAL Left 01/17/2017   Procedure: HARDWARE REMOVAL;  Surgeon: Rande Bushy, MD;  Location: [facility];  Service: Orthopedics;  Laterality: Left;   HERNIA REPAIR  2014   esophageal and gastric mesh. patient unable to throw up d/t mesh   HIP ARTHROPLASTY Left 01/26/2017   Procedure: ARTHROPLASTY BIPOLAR HIP (HEMIARTHROPLASTY) removal hardware left hip;  Surgeon: Marlynn Singer, MD;  Location: [facility];  Service:  Orthopedics;  Laterality: Left;   INTRAMEDULLARY (IM) NAIL INTERTROCHANTERIC Right 02/13/2023   Procedure: INTRAMEDULLARY (IM) NAIL INTERTROCHANTERIC;  Surgeon: Elner Hahn, MD;  Location: [facility];  Service: Orthopedics;  Laterality: Right;   JOINT REPLACEMENT Bilateral    knees   PACEMAKER IMPLANT N/A 11/15/2022   Procedure: PACEMAKER IMPLANT;  Surgeon: Percival Brace, MD;  Location: [facility];  Service: Cardiovascular;  Laterality: N/A;   PACEMAKER IMPLANT N/A 11/28/2022   Procedure: PACEMAKER IMPLANT;  Surgeon: Percival Brace, MD;  Location: [facility];  Service: Cardiovascular;  Laterality: N/A;  Lead reposition   REPLACEMENT TOTAL KNEE BILATERAL Bilateral [id number]   SHOULDER ARTHROSCOPY WITH OPEN ROTATOR CUFF REPAIR AND DISTAL CLAVICLE ACROMINECTOMY Left 10/25/2016   Procedure: SHOULDER ARTHROSCOPY WITH OPEN ROTATOR CUFF REPAIR AND DISTAL CLAVICLE ACROMINECTOMY;  Surgeon: Rande Bushy, MD;  Location: [facility];  Service: Orthopedics;  Laterality: Left;   THYROID  SURGERY     goiter removed   TOTAL HIP REVISION Left 12/02/2017   Procedure: TOTAL HIP REVISION;  Surgeon: Jerlyn Moons, MD;  Location: [facility];  Service: Orthopedics;  Laterality: Left;   TOTAL SHOULDER REPLACEMENT Right 2012   Social History:  reports that she has never smoked. She has never used smokeless tobacco. She reports that she does not drink alcohol and does not use drugs.  Allergies  Allergen Reactions   Baclofen Other (See Comments)    Elevated Cr   Simvastatin Other (See Comments)    Pt denies  Elevated LFTs with simva 40mg , stopped and resolved (see MD note 11/10/13)    Family History  Problem Relation Age of Onset   Heart failure Mother    Hypertension Mother    Emphysema Father     Prior to Admission medications   Medication Sig Start Date End Date Taking? Authorizing Provider  acetaminophen  (TYLENOL ) 325 MG tablet Take 2 tablets (650 mg total) by mouth every 6  (six) hours as needed for headache or mild pain (pain score 1-3). 11/12/23   Melodi Sprung, DO  albuterol  (VENTOLIN  HFA) 108 (90 Base) MCG/ACT inhaler Inhale 2 puffs into the lungs every 6 (six) hours as needed. 08/04/19   [provider]  amiodarone  (PACERONE ) 200 MG tablet Take 1 tablet by mouth daily. 08/26/23   [provider]  apixaban  (ELIQUIS ) 2.5 MG TABS tablet Take 1 tablet (2.5 mg total) by mouth 2 (two) times daily. 06/07/22   Tiajuana Fluke, MD  ascorbic acid  (VITAMIN C ) 250 MG CHEW Chew 250 mg by mouth daily.    [provider]  carvedilol  (COREG ) 6.25 MG tablet Take 1 tablet (6.25 mg total) by mouth 2 (two) times daily with a meal. 11/17/23 12/17/23  Verla Glaze, MD  Cholecalciferol  (VITAMIN D -1000 MAX ST) 25 MCG (1000 UT) tablet Take 1,000 Units by mouth daily.    [provider]  Cyanocobalamin  (B-12) 2500 MCG TABS Take  2,500 mcg by mouth daily.    [provider]  hydrALAZINE  (APRESOLINE ) 25 MG tablet Take 1 tablet (25 mg total) by mouth every 8 (eight) hours. 11/17/23   Verla Glaze, MD  ipratropium-albuterol  (DUONEB) 0.5-2.5 (3) MG/3ML SOLN Inhale 3 mLs into the lungs every 6 (six) hours as needed. 06/01/22   [provider]  isosorbide  mononitrate (IMDUR ) 30 MG 24 hr tablet Take 1 tablet (30 mg total) by mouth at bedtime. 11/17/23 12/17/23  Verla Glaze, MD  morphine  (ROXANOL) 20 MG/ML concentrated solution Take 0.13 mLs (2.6 mg total) by mouth every 2 (two) hours as needed for severe pain (pain score 7-10) or shortness of breath (air hunger). 11/17/23   Verla Glaze, MD  Multiple Vitamin (MULTIVITAMIN WITH MINERALS) TABS tablet Take 1 tablet by mouth daily. One-A-Day Women's Vitamin    [provider]  ondansetron  (ZOFRAN ) 4 MG tablet Take 1 tablet (4 mg total) by mouth every 6 (six) hours as needed for nausea. 11/17/23   Verla Glaze, MD  sacubitril -valsartan  (ENTRESTO ) 24-26 MG Take 1 tablet by  mouth 2 (two) times daily. 11/17/23   Verla Glaze, MD  torsemide  (DEMADEX ) 20 MG tablet Take 1 tablet (20 mg total) by mouth 2 (two) times daily. Increase to 1 tablet (20 mg total) by mouth THREE TIMES daily (total daily dose MAX 60 mg) as needed for up to 3 days for increased leg swelling, shortness of breath, weight gain 5+ lbs over 1-2 days. CALL YOUR CARDIOLOGIST IF YOU NEED TO TAKE EXTRA MEDICINE, THEY MAY WANT TO SEE YOU IN OFFICE OR REFER TO ER IF NEEDED 11/12/23 12/12/23  Melodi Sprung, DO  TRELEGY ELLIPTA 100-62.5-25 MCG/ACT AEPB Inhale 1 puff into the lungs daily. 08/03/22   [provider]    Physical Exam: Vitals:   11/26/23 1439 11/26/23 1700 11/26/23 1730 11/26/23 2036  BP: (!) 149/67 (!) 146/69 (!) 151/65 (!) 143/71  Pulse: 60 60 62 64  Resp: 12 13 16 18   Temp:    97.9 F (36.6 C)  TempSrc:      SpO2:   97% 98%  Weight:      Height:       Physical Exam Vitals and nursing note reviewed.  Constitutional:      General: She is not in acute distress.    Appearance: She is normal weight.  HENT:     Head: Normocephalic and atraumatic.     Mouth/Throat:     Mouth: Mucous membranes are moist.     Pharynx: Oropharynx is clear.  Eyes:     Conjunctiva/sclera: Conjunctivae normal.     Pupils: Pupils are equal, round, and reactive to light.  Cardiovascular:     Rate and Rhythm: Normal rate and regular rhythm.     Heart sounds: No murmur heard.    Comments: Trace bilateral pitting edema Pulmonary:     Effort: Pulmonary effort is normal. No respiratory distress.     Breath sounds: No wheezing, rhonchi or rales.  Abdominal:     General: Bowel sounds are normal. There is no distension.     Palpations: Abdomen is soft.     Tenderness: There is no abdominal tenderness. There is no guarding.  Musculoskeletal:        General: Tenderness (Significant TTP of the right shoulder. No deformity noted.) present.  Skin:    General: Skin is warm and dry.  Neurological:      General: No focal deficit present.     Mental Status:  She is alert and oriented to person, place, and time. Mental status is at baseline.  Psychiatric:        Mood and Affect: Mood normal.        Behavior: Behavior normal.    Data Reviewed: CBC with WBC 5.8, hemoglobin of 11.3, platelets of 224 BMP with sodium of 136, potassium 3.4, bicarb 25, glucose 138, BUN 69, creatinine 2.6 and GFR 17  CT Shoulder Right Wo Contrast Result Date: 11/26/2023 CLINICAL DATA:  Axillary lymphadenopathy is seen. EXAM: CT OF THE UPPER RIGHT EXTREMITY WITHOUT CONTRAST TECHNIQUE: Multidetector CT imaging of the upper right extremity was performed according to the standard protocol. RADIATION DOSE REDUCTION: This exam was performed according to the departmental dose-optimization program which includes automated exposure control, adjustment of the mA and/or kV according to patient size and/or use of iterative reconstruction technique. COMPARISON:  Right shoulder and humerus radiographs 11/26/2023 FINDINGS: Bones/Joint/Cartilage On today's radiographs, there is question of mild cortical step-off at the inferior aspect of the scapular body, near the inferior angle. On the current CT, linear lucency and mild angulation is seen in this region (axial series 3 images 52 through 66 and coronal series 7 images 84 through 90), this is near the region of the serratus anterior attachment on the deep aspect of the scapula and the teres minor attachment on the superficial/posterior aspect of the inferior scapular angle. There is mild surrounding subcutaneous fat edema and stranding. Postsurgical changes are seen of reverse total right shoulder arthroplasty. Within the limitation of metallic streak artifact, no perihardware lucency is seen to indicate hardware failure or loosening. Moderate to severe acromioclavicular joint space narrowing with subchondral sclerosis and moderate peripheral osteophytosis. Multiple chronic well corticated  superior ossicles. Type III acromion, with mild downsloping of the anterolateral acromion. Ligaments Suboptimally assessed by CT. Muscles and Tendons There is moderate to high-grade supraspinatus and mild-to-moderate infraspinatus muscle atrophy. Mild subscapularis muscle atrophy. Soft tissues No axillary lymphadenopathy is seen. IMPRESSION: 1. An area of interest on the prior radiographs, there is linear lucency and mild angulation at the inferior angle of the scapular body, near the region of the serratus anterior attachment on the deep aspect of the scapula. This suggests a nondisplaced fracture. 2. Postsurgical changes of reverse total right shoulder arthroplasty. No evidence of hardware failure or loosening. 3. 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 4. Moderate to high-grade supraspinatus and mild-to-moderate infraspinatus muscle atrophy. Mild subscapularis muscle atrophy. Electronically Signed   By: Bertina Broccoli M.D.   On: 11/26/2023 17:24   CT Head Wo Contrast Result Date: 11/26/2023 CLINICAL DATA:  Unwitnessed fall.  Trauma to the head and neck EXAM: CT HEAD WITHOUT CONTRAST CT CERVICAL SPINE WITHOUT CONTRAST TECHNIQUE: Multidetector CT imaging of the head and cervical spine was performed following the standard protocol without intravenous contrast. Multiplanar CT image reconstructions of the cervical spine were also generated. RADIATION DOSE REDUCTION: This exam was performed according to the departmental dose-optimization program which includes automated exposure control, adjustment of the mA and/or kV according to patient size and/or use of iterative reconstruction technique. COMPARISON:  None Available. FINDINGS: CT HEAD FINDINGS Brain: Age related volume loss. No focal abnormality affects the brainstem or cerebellum. There is chronic encephalomalacia of the right temporal lobe, probably due to an old stroke. This could possibly relate prior head trauma. Cerebral hemispheres otherwise  show chronic small-vessel ischemic change of the white matter. No recent stroke, mass, hemorrhage, hydrocephalus or extra-axial collection. Vascular: There is atherosclerotic calcification of the major  vessels at the base of the brain. Skull: Negative Sinuses/Orbits: Clear/normal Other: None CT CERVICAL SPINE FINDINGS Alignment: Cervicothoracic curvature convex to the left. Degenerative anterolisthesis at C6-7 of 3 mm. Skull base and vertebrae: No acute fracture. Chronic fusion at the C2-3 level. Chronic fusion at the C4-5 level. Soft tissues and spinal canal: No traumatic soft tissue finding. Multiple thyroid  nodules and/or cysts, the largest measuring 3 cm. Normally, thyroid  ultrasound would be recommended in this setting. However, because of the patient's age and comorbidities, follow-up may not be necessary. The likelihood of malignant disease is low. Disc levels: The foramen magnum is widely patent. There is ordinary mild osteoarthritis of the C1-2 articulation but no encroachment upon the neural structures. C2-3: Chronic fusion.  Sufficient patency of the canal and foramina. C3-4: Mobile interspace. Endplate osteophytes. Facet osteophytes. Mild canal stenosis. Bilateral foraminal narrowing that could cause neural compression. C4-5: Chronic fusion.  Sufficient patency of the canal and foramina. C5-6: Mobile level. Endplate osteophytes and facet osteophytes worse on the right. Right foraminal stenosis could compress the right C6 nerve. C6-7: Chronic facet arthropathy worse on the right with anterolisthesis of 3 mm. No central canal stenosis. Foraminal narrowing on the right that could affect the C7 nerve. C7-T1: Facet arthritis on the right. 1-2 mm of degenerative anterolisthesis. Mild foraminal narrowing on the right. Upper chest: Negative Other: None IMPRESSION: HEAD CT: No acute or traumatic finding. Chronic encephalomalacia of the right temporal lobe, probably due to an old stroke. This could possibly relate  to prior head trauma. CERVICAL SPINE CT: 1. No acute or traumatic finding. Chronic fusion at C2-3 and C4-5. Degenerative changes at the other levels as outlined above. 2. Multiple thyroid  nodules and/or cysts, the largest measuring 3 cm. Normally, thyroid  ultrasound would be recommended in this setting. However, because of the patient's age and comorbidities, this may not be necessary. The likelihood of malignant disease is low. Electronically Signed   By: Bettylou Brunner M.D.   On: 11/26/2023 17:04   CT Cervical Spine Wo Contrast Result Date: 11/26/2023 CLINICAL DATA:  Unwitnessed fall.  Trauma to the head and neck EXAM: CT HEAD WITHOUT CONTRAST CT CERVICAL SPINE WITHOUT CONTRAST TECHNIQUE: Multidetector CT imaging of the head and cervical spine was performed following the standard protocol without intravenous contrast. Multiplanar CT image reconstructions of the cervical spine were also generated. RADIATION DOSE REDUCTION: This exam was performed according to the departmental dose-optimization program which includes automated exposure control, adjustment of the mA and/or kV according to patient size and/or use of iterative reconstruction technique. COMPARISON:  None Available. FINDINGS: CT HEAD FINDINGS Brain: Age related volume loss. No focal abnormality affects the brainstem or cerebellum. There is chronic encephalomalacia of the right temporal lobe, probably due to an old stroke. This could possibly relate prior head trauma. Cerebral hemispheres otherwise show chronic small-vessel ischemic change of the white matter. No recent stroke, mass, hemorrhage, hydrocephalus or extra-axial collection. Vascular: There is atherosclerotic calcification of the major vessels at the base of the brain. Skull: Negative Sinuses/Orbits: Clear/normal Other: None CT CERVICAL SPINE FINDINGS Alignment: Cervicothoracic curvature convex to the left. Degenerative anterolisthesis at C6-7 of 3 mm. Skull base and vertebrae: No acute  fracture. Chronic fusion at the C2-3 level. Chronic fusion at the C4-5 level. Soft tissues and spinal canal: No traumatic soft tissue finding. Multiple thyroid  nodules and/or cysts, the largest measuring 3 cm. Normally, thyroid  ultrasound would be recommended in this setting. However, because of the patient's age and comorbidities, follow-up may  not be necessary. The likelihood of malignant disease is low. Disc levels: The foramen magnum is widely patent. There is ordinary mild osteoarthritis of the C1-2 articulation but no encroachment upon the neural structures. C2-3: Chronic fusion.  Sufficient patency of the canal and foramina. C3-4: Mobile interspace. Endplate osteophytes. Facet osteophytes. Mild canal stenosis. Bilateral foraminal narrowing that could cause neural compression. C4-5: Chronic fusion.  Sufficient patency of the canal and foramina. C5-6: Mobile level. Endplate osteophytes and facet osteophytes worse on the right. Right foraminal stenosis could compress the right C6 nerve. C6-7: Chronic facet arthropathy worse on the right with anterolisthesis of 3 mm. No central canal stenosis. Foraminal narrowing on the right that could affect the C7 nerve. C7-T1: Facet arthritis on the right. 1-2 mm of degenerative anterolisthesis. Mild foraminal narrowing on the right. Upper chest: Negative Other: None IMPRESSION: HEAD CT: No acute or traumatic finding. Chronic encephalomalacia of the right temporal lobe, probably due to an old stroke. This could possibly relate to prior head trauma. CERVICAL SPINE CT: 1. No acute or traumatic finding. Chronic fusion at C2-3 and C4-5. Degenerative changes at the other levels as outlined above. 2. Multiple thyroid  nodules and/or cysts, the largest measuring 3 cm. Normally, thyroid  ultrasound would be recommended in this setting. However, because of the patient's age and comorbidities, this may not be necessary. The likelihood of malignant disease is low. Electronically Signed    By: Bettylou Brunner M.D.   On: 11/26/2023 17:04   DG Chest 1 View Result Date: 11/26/2023 CLINICAL DATA:  Status post fall EXAM: CHEST  1 VIEW COMPARISON:  November 15, 2023 FINDINGS: The heart size and mediastinal contours are within normal limits. Both lungs are clear. The visualized skeletal structures are unremarkable. 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   On: 11/26/2023 15:48   DG Shoulder Right Result Date: 11/26/2023 CLINICAL DATA:  Fall. EXAM: RIGHT SHOULDER - 2+ VIEW COMPARISON:  None Available. FINDINGS: Status post right total shoulder arthroplasty. No dislocation is noted. Possible mildly displaced fracture involving inferior portion of right scapula. IMPRESSION: Possible mildly displaced right scapular fracture as noted above. Electronically Signed   By: Rosalene Colon M.D.   On: 11/26/2023 15:44   DG Humerus Right Result Date: 11/26/2023 CLINICAL DATA:  Right arm pain after fall. EXAM: RIGHT HUMERUS - 2+ VIEW COMPARISON:  None Available. FINDINGS: Status post right shoulder arthroplasty. No fracture or dislocation is noted. IMPRESSION: No acute abnormality seen. Electronically Signed   By: Rosalene Colon M.D.   On: 11/26/2023 15:42   There are no new results to review at this time.  Assessment and Plan:  * Scapular fracture Patient is presenting with a right scapular fracture after a mechanical ground-level fall while trying to rush to the restroom.  Orthopedic surgery recommending sling and pain control; no indication for surgical intervention.  Given patient walks with a walker at baseline, she will likely require SNF, although she has declined this in the past.  - Pain control with Tylenol , oxycodone , Dilaudid  - Continuous  pulse oximetry while receiving opioids - PT/OT - Sling ordered  HFrEF (heart failure with reduced ejection fraction) (HCC) Patient has a history of HFrEF with last EF of 25-30%.  She follows with [facility], who  she saw earlier today.  She appears essentially euvolemic on examination.  - Continue home regimen  CKD (chronic kidney disease) stage 4, GFR 15-29 ml/min (HCC) Renal function currently at baseline, not contributing to current presentation.  Type II diabetes mellitus with renal manifestations (HCC) History of diet-controlled type 2 diabetes with last A1c of 7.0%.  Will hold off on SSI at this time  COPD (chronic obstructive pulmonary disease) (HCC) Patient denies any shortness of breath, wheezing or cough at this time.  - Continue home regimen  Advance Care Planning:   Code Status: Limited: Do not attempt resuscitation (DNR) -DNR-LIMITED -Do Not Intubate/DNI    Consults: None  Family Communication: No family at bedside  Severity of Illness: The appropriate patient status for this patient is OBSERVATION. Observation status is judged to be reasonable and necessary in order to provide the required intensity of service to ensure the patient's safety. The patient's presenting symptoms, physical exam findings, and initial radiographic and laboratory data in the context of their medical condition is felt to place them at decreased risk for further clinical deterioration. Furthermore, it is anticipated that the patient will be medically stable for discharge from the hospital within 2 midnights of admission.   Author: Avi Body, MD 11/26/2023 9:21 PM  For on call review [email or web address].

## 2023-11-26 NOTE — ED Triage Notes (Signed)
 Pt here after a fall today. Pt states she tripped and fell and injured her right shoulder. Pt denies hitting her head or LOC. Pt talking complete sentences and denies any other symptoms.

## 2023-11-26 NOTE — ED Triage Notes (Signed)
 First Nurse Note: Patient to ED via [facility] from home for an unwitnessed fall. PT reports tripping over her walker trying to go to the restroom. C/o right shoulder pain. Denies LOC. On blood thinners.   146/90 97% RA 63 HR

## 2023-11-26 NOTE — Assessment & Plan Note (Signed)
 Renal function currently at baseline, not contributing to current presentation.

## 2023-11-26 NOTE — ED Notes (Signed)
 Fall precautions in place for Pt. This RN placed fall band, fall grip socks, bed alarm and fall sign.

## 2023-11-26 NOTE — Assessment & Plan Note (Signed)
 Patient denies any shortness of breath, wheezing or cough at this time.  - Continue home regimen

## 2023-11-26 NOTE — ED Notes (Signed)
 Stool sample sent with Pt save tube label

## 2023-11-26 NOTE — Assessment & Plan Note (Signed)
 History of diet-controlled type 2 diabetes with last A1c of 7.0%.  Will hold off on SSI at this time

## 2023-11-27 ENCOUNTER — Observation Stay

## 2023-11-27 DIAGNOSIS — N184 Chronic kidney disease, stage 4 (severe): Secondary | ICD-10-CM | POA: Diagnosis not present

## 2023-11-27 DIAGNOSIS — E1122 Type 2 diabetes mellitus with diabetic chronic kidney disease: Secondary | ICD-10-CM | POA: Diagnosis not present

## 2023-11-27 DIAGNOSIS — S42101A Fracture of unspecified part of scapula, right shoulder, initial encounter for closed fracture: Secondary | ICD-10-CM | POA: Diagnosis not present

## 2023-11-27 DIAGNOSIS — I5020 Unspecified systolic (congestive) heart failure: Secondary | ICD-10-CM | POA: Diagnosis not present

## 2023-11-27 DIAGNOSIS — J449 Chronic obstructive pulmonary disease, unspecified: Secondary | ICD-10-CM | POA: Diagnosis not present

## 2023-11-27 DIAGNOSIS — R112 Nausea with vomiting, unspecified: Secondary | ICD-10-CM | POA: Diagnosis not present

## 2023-11-27 MED ORDER — ALUM & MAG HYDROXIDE-SIMETH 200-200-20 MG/5ML PO SUSP: 30.0000 mL | Freq: Four times a day (QID) | ORAL | Status: DC | PRN

## 2023-11-27 MED ORDER — OXYCODONE HCL 5 MG PO TABS
5.0000 mg | ORAL_TABLET | Freq: Four times a day (QID) | ORAL | Status: DC | PRN
  Administered 2023-11-27 – 2023-11-28 (×4): 5 mg via ORAL
  Filled 2023-11-27 (×4): qty 1

## 2023-11-27 MED ORDER — PROCHLORPERAZINE EDISYLATE 10 MG/2ML IJ SOLN
10.0000 mg | INTRAMUSCULAR | Status: AC
  Administered 2023-11-27: 10 mg via INTRAVENOUS
  Filled 2023-11-27: qty 2
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
## 2023-11-27 NOTE — Significant Event (Signed)
       CROSS COVER NOTE  NAME: Leslie Parker MRN: [id number] DOB : [birth year] ATTENDING PHYSICIAN: Brenna Cam, MD    Date of Service   11/27/2023   HPI/Events of Note   Intractable nausea and vomiting Initially nurse reported patient said she felt like her "pacemaker was congested"   Interventions   Assessment/Plan:    11/27/2023    8:28 PM 11/27/2023    3:59 PM 11/27/2023    8:04 AM  Vitals with BMI  Systolic 95 132 118  Diastolic 55 61 55  Pulse 58 60 60   Bedside patient reports severe nausea. 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 +dry heaving wretching Denies reflux  Last bowel movement 5/5 X X X

## 2023-11-27 NOTE — Plan of Care (Signed)

## 2023-11-27 NOTE — Progress Notes (Signed)
 1      PROGRESS NOTE    Leslie Parker  ZOX:[id number] DOB: 1933/04/09 DOA: 11/26/2023 PCP: Rex Castor, MD    Brief Narrative:   89 y.o. female with medical history significant of chronic HFrEF with last EF of 25-30%, SSS s/p PPM, atrial fibrillation on Eliquis , COPD, hypertension, hyperlipidemia, CKD stage IV, osteopenia, who presents to the ED due to ground-level fall.  Admitted for scapular fracture with uncontrolled pain  5/7: PT and OT evaluation, increase oxycodone  frequency from Q8 to every 6 hours     Assessment & Plan:   Principal Problem:   Scapular fracture Active Problems:   COPD (chronic obstructive pulmonary disease) (HCC)   Type II diabetes mellitus with renal manifestations (HCC)   CKD (chronic kidney disease) stage 4, GFR 15-29 ml/min (HCC)   HFrEF (heart failure with reduced ejection fraction) (HCC)  *Inferior scapular fracture Patient is presenting with a right scapular fracture after a mechanical ground-level fall while trying to rush to the restroom.  Orthopedic surgery recommending sling and pain control; no indication for surgical intervention.  - platform weight bearing through the arm.  - Pain control with Tylenol , oxycodone , Dilaudid  - Continuous pulse oximetry while receiving opioids - PT/OT recommends SNF - Sling in place   HFrEF (heart failure with reduced ejection fraction) (HCC) Patient has a history of HFrEF with last EF of 25-30%.  She follows with [facility], who she saw on the day of admission she appears essentially euvolemic on examination.   - Continue home regimen   CKD (chronic kidney disease) stage 4, GFR 15-29 ml/min (HCC) Renal function currently at baseline, not contributing to current presentation.   Type II diabetes mellitus with renal manifestations (HCC) History of diet-controlled type 2 diabetes with last A1c of 7.0%.  Will hold off on SSI at this time   COPD (chronic obstructive pulmonary disease)  (HCC) Patient denies any shortness of breath, wheezing or cough at this time.   - Continue home regimen   DVT prophylaxis: Eliquis  apixaban  (ELIQUIS ) tablet 2.5 mg Start: 11/26/23 2215 apixaban  (ELIQUIS ) tablet 2.5 mg     Code Status: DNR, followed by palliative care as an outpatient.  She was discharged under hospice care but family declined hospice Family Communication: Updated granddaughter/[name] over phone Disposition Plan: Possible discharge in next 2 to 3 days depending on clinical condition and placement.  Likely Friday if SNF bed is available    Subjective:  She is dry heaving and having Nahser.  Says pain is better controlled although more frequent pain medication may help  Objective: Vitals:   11/26/23 2036 11/27/23 0509 11/27/23 0804 11/27/23 1559  BP: (!) 143/71 (!) 96/57 (!) 118/55 132/61  Pulse: 64 60 60 60  Resp: 18 17 17 16   Temp: 97.9 F (36.6 C) (!) 97.5 F (36.4 C) (!) 97.5 F (36.4 C) 97.6 F (36.4 C)  TempSrc:    Oral  SpO2: 98% 97% 96% 98%  Weight:      Height:       No intake or output data in the 24 hours ending 11/27/23 1630 Filed Weights   11/26/23 1408  Weight: 70.9 kg    Examination:  General exam: Appears calm and comfortable  Respiratory system: Clear to auscultation. Respiratory effort normal. Cardiovascular system: S1 & S2 heard, RRR.  Gastrointestinal system: Abdomen is soft, benign Central nervous system: Alert and oriented. No focal neurological deficits. Extremities: Right upper extremity in sling and tenderness around the shoulder  joint Skin: No rashes, lesions or ulcers Psychiatry: Judgement and insight appear normal. Mood & affect appropriate.     Data Reviewed: I have personally reviewed following labs and imaging studies  CBC: Recent Labs  Lab 11/26/23 1718  WBC 5.8  HGB 11.3*  HCT 34.2*  MCV 95.3  PLT 224   Basic Metabolic Panel: Recent Labs  Lab 11/26/23 1718  NA 136  K 3.4*  CL 100  CO2 25  GLUCOSE  138*  BUN 69*  CREATININE 2.60*  CALCIUM 8.5*      Radiology Studies: CT Shoulder Right Wo Contrast Result Date: 11/26/2023 CLINICAL DATA:  Axillary lymphadenopathy is seen. EXAM: CT OF THE UPPER RIGHT EXTREMITY WITHOUT CONTRAST TECHNIQUE: Multidetector CT imaging of the upper right extremity was performed according to the standard protocol. RADIATION DOSE REDUCTION: This exam was performed according to the departmental dose-optimization program which includes automated exposure control, adjustment of the mA and/or kV according to patient size and/or use of iterative reconstruction technique. COMPARISON:  Right shoulder and humerus radiographs 11/26/2023 FINDINGS: Bones/Joint/Cartilage On today's radiographs, there is question of mild cortical step-off at the inferior aspect of the scapular body, near the inferior angle. On the current CT, linear lucency and mild angulation is seen in this region (axial series 3 images 52 through 66 and coronal series 7 images 84 through 90), this is near the region of the serratus anterior attachment on the deep aspect of the scapula and the teres minor attachment on the superficial/posterior aspect of the inferior scapular angle. There is mild surrounding subcutaneous fat edema and stranding. Postsurgical changes are seen of reverse total right shoulder arthroplasty. Within the limitation of metallic streak artifact, no perihardware lucency is seen to indicate hardware failure or loosening. Moderate to severe acromioclavicular joint space narrowing with subchondral sclerosis and moderate peripheral osteophytosis. Multiple chronic well corticated superior ossicles. Type III acromion, with mild downsloping of the anterolateral acromion. Ligaments Suboptimally assessed by CT. Muscles and Tendons There is moderate to high-grade supraspinatus and mild-to-moderate infraspinatus muscle atrophy. Mild subscapularis muscle atrophy. Soft tissues No axillary lymphadenopathy is seen.  IMPRESSION: 1. An area of interest on the prior radiographs, there is linear lucency and mild angulation at the inferior angle of the scapular body, near the region of the serratus anterior attachment on the deep aspect of the scapula. This suggests a nondisplaced fracture. 2. Postsurgical changes of reverse total right shoulder arthroplasty. No evidence of hardware failure or loosening. 3. 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 4. Moderate to high-grade supraspinatus and mild-to-moderate infraspinatus muscle atrophy. Mild subscapularis muscle atrophy. Electronically Signed   By: Bertina Broccoli M.D.   On: 11/26/2023 17:24   CT Head Wo Contrast Result Date: 11/26/2023 CLINICAL DATA:  Unwitnessed fall.  Trauma to the head and neck EXAM: CT HEAD WITHOUT CONTRAST CT CERVICAL SPINE WITHOUT CONTRAST TECHNIQUE: Multidetector CT imaging of the head and cervical spine was performed following the standard protocol without intravenous contrast. Multiplanar CT image reconstructions of the cervical spine were also generated. RADIATION DOSE REDUCTION: This exam was performed according to the departmental dose-optimization program which includes automated exposure control, adjustment of the mA and/or kV according to patient size and/or use of iterative reconstruction technique. COMPARISON:  None Available. FINDINGS: CT HEAD FINDINGS Brain: Age related volume loss. No focal abnormality affects the brainstem or cerebellum. There is chronic encephalomalacia of the right temporal lobe, probably due to an old stroke. This could possibly relate prior head trauma. Cerebral hemispheres otherwise show chronic  small-vessel ischemic change of the white matter. No recent stroke, mass, hemorrhage, hydrocephalus or extra-axial collection. Vascular: There is atherosclerotic calcification of the major vessels at the base of the brain. Skull: Negative Sinuses/Orbits: Clear/normal Other: None CT CERVICAL SPINE FINDINGS Alignment:  Cervicothoracic curvature convex to the left. Degenerative anterolisthesis at C6-7 of 3 mm. Skull base and vertebrae: No acute fracture. Chronic fusion at the C2-3 level. Chronic fusion at the C4-5 level. Soft tissues and spinal canal: No traumatic soft tissue finding. Multiple thyroid  nodules and/or cysts, the largest measuring 3 cm. Normally, thyroid  ultrasound would be recommended in this setting. However, because of the patient's age and comorbidities, follow-up may not be necessary. The likelihood of malignant disease is low. Disc levels: The foramen magnum is widely patent. There is ordinary mild osteoarthritis of the C1-2 articulation but no encroachment upon the neural structures. C2-3: Chronic fusion.  Sufficient patency of the canal and foramina. C3-4: Mobile interspace. Endplate osteophytes. Facet osteophytes. Mild canal stenosis. Bilateral foraminal narrowing that could cause neural compression. C4-5: Chronic fusion.  Sufficient patency of the canal and foramina. C5-6: Mobile level. Endplate osteophytes and facet osteophytes worse on the right. Right foraminal stenosis could compress the right C6 nerve. C6-7: Chronic facet arthropathy worse on the right with anterolisthesis of 3 mm. No central canal stenosis. Foraminal narrowing on the right that could affect the C7 nerve. C7-T1: Facet arthritis on the right. 1-2 mm of degenerative anterolisthesis. Mild foraminal narrowing on the right. Upper chest: Negative Other: None IMPRESSION: HEAD CT: No acute or traumatic finding. Chronic encephalomalacia of the right temporal lobe, probably due to an old stroke. This could possibly relate to prior head trauma. CERVICAL SPINE CT: 1. No acute or traumatic finding. Chronic fusion at C2-3 and C4-5. Degenerative changes at the other levels as outlined above. 2. Multiple thyroid  nodules and/or cysts, the largest measuring 3 cm. Normally, thyroid  ultrasound would be recommended in this setting. However, because of the  patient's age and comorbidities, this may not be necessary. The likelihood of malignant disease is low. Electronically Signed   By: Bettylou Brunner M.D.   On: 11/26/2023 17:04   CT Cervical Spine Wo Contrast Result Date: 11/26/2023 CLINICAL DATA:  Unwitnessed fall.  Trauma to the head and neck EXAM: CT HEAD WITHOUT CONTRAST CT CERVICAL SPINE WITHOUT CONTRAST TECHNIQUE: Multidetector CT imaging of the head and cervical spine was performed following the standard protocol without intravenous contrast. Multiplanar CT image reconstructions of the cervical spine were also generated. RADIATION DOSE REDUCTION: This exam was performed according to the departmental dose-optimization program which includes automated exposure control, adjustment of the mA and/or kV according to patient size and/or use of iterative reconstruction technique. COMPARISON:  None Available. FINDINGS: CT HEAD FINDINGS Brain: Age related volume loss. No focal abnormality affects the brainstem or cerebellum. There is chronic encephalomalacia of the right temporal lobe, probably due to an old stroke. This could possibly relate prior head trauma. Cerebral hemispheres otherwise show chronic small-vessel ischemic change of the white matter. No recent stroke, mass, hemorrhage, hydrocephalus or extra-axial collection. Vascular: There is atherosclerotic calcification of the major vessels at the base of the brain. Skull: Negative Sinuses/Orbits: Clear/normal Other: None CT CERVICAL SPINE FINDINGS Alignment: Cervicothoracic curvature convex to the left. Degenerative anterolisthesis at C6-7 of 3 mm. Skull base and vertebrae: No acute fracture. Chronic fusion at the C2-3 level. Chronic fusion at the C4-5 level. Soft tissues and spinal canal: No traumatic soft tissue finding. Multiple thyroid  nodules and/or cysts,  the largest measuring 3 cm. Normally, thyroid  ultrasound would be recommended in this setting. However, because of the patient's age and comorbidities,  follow-up may not be necessary. The likelihood of malignant disease is low. Disc levels: The foramen magnum is widely patent. There is ordinary mild osteoarthritis of the C1-2 articulation but no encroachment upon the neural structures. C2-3: Chronic fusion.  Sufficient patency of the canal and foramina. C3-4: Mobile interspace. Endplate osteophytes. Facet osteophytes. Mild canal stenosis. Bilateral foraminal narrowing that could cause neural compression. C4-5: Chronic fusion.  Sufficient patency of the canal and foramina. C5-6: Mobile level. Endplate osteophytes and facet osteophytes worse on the right. Right foraminal stenosis could compress the right C6 nerve. C6-7: Chronic facet arthropathy worse on the right with anterolisthesis of 3 mm. No central canal stenosis. Foraminal narrowing on the right that could affect the C7 nerve. C7-T1: Facet arthritis on the right. 1-2 mm of degenerative anterolisthesis. Mild foraminal narrowing on the right. Upper chest: Negative Other: None IMPRESSION: HEAD CT: No acute or traumatic finding. Chronic encephalomalacia of the right temporal lobe, probably due to an old stroke. This could possibly relate to prior head trauma. CERVICAL SPINE CT: 1. No acute or traumatic finding. Chronic fusion at C2-3 and C4-5. Degenerative changes at the other levels as outlined above. 2. Multiple thyroid  nodules and/or cysts, the largest measuring 3 cm. Normally, thyroid  ultrasound would be recommended in this setting. However, because of the patient's age and comorbidities, this may not be necessary. The likelihood of malignant disease is low. Electronically Signed   By: Bettylou Brunner M.D.   On: 11/26/2023 17:04   DG Chest 1 View Result Date: 11/26/2023 CLINICAL DATA:  Status post fall EXAM: CHEST  1 VIEW COMPARISON:  November 15, 2023 FINDINGS: The heart size and mediastinal contours are within normal limits. Both lungs are clear. The visualized skeletal structures are unremarkable. 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   On: 11/26/2023 15:48   DG Shoulder Right Result Date: 11/26/2023 CLINICAL DATA:  Fall. EXAM: RIGHT SHOULDER - 2+ VIEW COMPARISON:  None Available. FINDINGS: Status post right total shoulder arthroplasty. No dislocation is noted. Possible mildly displaced fracture involving inferior portion of right scapula. IMPRESSION: Possible mildly displaced right scapular fracture as noted above. Electronically Signed   By: Rosalene Colon M.D.   On: 11/26/2023 15:44   DG Humerus Right Result Date: 11/26/2023 CLINICAL DATA:  Right arm pain after fall. EXAM: RIGHT HUMERUS - 2+ VIEW COMPARISON:  None Available. FINDINGS: Status post right shoulder arthroplasty. No fracture or dislocation is noted. IMPRESSION: No acute abnormality seen. Electronically Signed   By: Rosalene Colon M.D.   On: 11/26/2023 15:42        Scheduled Meds:  acetaminophen   1,000 mg Oral TID   amiodarone   200 mg Oral Daily   apixaban   2.5 mg Oral BID   budeson-glycopyrrolate -formoterol   2 puff Inhalation BID   carvedilol   6.25 mg Oral BID WC   cholecalciferol   1,000 Units Oral Daily   sacubitril -valsartan   1 tablet Oral BID   sodium chloride  flush  3 mL Intravenous Q12H   torsemide   20 mg Oral Daily   Continuous Infusions:   LOS: 0 days    Time spent: 35 minutes  Brenna Cam, MD [facility] Pager [phone]  If 7PM-7AM, please contact night-coverage [email or web address]  11/27/2023, 4:30 PM

## 2023-11-27 NOTE — Progress Notes (Signed)
 [facility] Liaison Note  This patient is currently  followed for outpatient palliative care services by [facility].  [facility] will follow through discharge disposition.    Please call with any palliative related questions or concerns.  [facility] Liaison [phone]

## 2023-11-27 NOTE — Care Management Obs Status (Signed)
 MEDICARE OBSERVATION STATUS NOTIFICATION   Patient Details  Name: Leslie Parker MRN: [id number] Date of Birth: Nov 04, 1932   Medicare Observation Status Notification Given:  Rudolph Cost, CMA 11/27/2023, 2:50 PM
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
## 2023-11-28 DIAGNOSIS — I5043 Acute on chronic combined systolic (congestive) and diastolic (congestive) heart failure: Secondary | ICD-10-CM | POA: Diagnosis not present

## 2023-11-28 DIAGNOSIS — N184 Chronic kidney disease, stage 4 (severe): Secondary | ICD-10-CM | POA: Diagnosis not present

## 2023-11-28 DIAGNOSIS — S42101A Fracture of unspecified part of scapula, right shoulder, initial encounter for closed fracture: Secondary | ICD-10-CM | POA: Diagnosis not present

## 2023-11-28 DIAGNOSIS — E1122 Type 2 diabetes mellitus with diabetic chronic kidney disease: Secondary | ICD-10-CM | POA: Diagnosis not present

## 2023-11-28 DIAGNOSIS — I480 Paroxysmal atrial fibrillation: Secondary | ICD-10-CM | POA: Diagnosis not present

## 2023-11-28 DIAGNOSIS — J9600 Acute respiratory failure, unspecified whether with hypoxia or hypercapnia: Secondary | ICD-10-CM | POA: Diagnosis not present

## 2023-11-28 DIAGNOSIS — D631 Anemia in chronic kidney disease: Secondary | ICD-10-CM | POA: Diagnosis not present

## 2023-11-28 DIAGNOSIS — I5020 Unspecified systolic (congestive) heart failure: Secondary | ICD-10-CM | POA: Diagnosis not present

## 2023-11-28 DIAGNOSIS — J449 Chronic obstructive pulmonary disease, unspecified: Secondary | ICD-10-CM | POA: Diagnosis not present

## 2023-11-28 DIAGNOSIS — I110 Hypertensive heart disease with heart failure: Secondary | ICD-10-CM | POA: Diagnosis not present

## 2023-11-28 LAB — CBC
HCT: 31.5 % — ABNORMAL LOW (ref 36.0–46.0)
Hemoglobin: 10.5 g/dL — ABNORMAL LOW (ref 12.0–15.0)
MCH: 31.5 pg (ref 26.0–34.0)
MCHC: 33.3 g/dL (ref 30.0–36.0)
MCV: 94.6 fL (ref 80.0–100.0)
Platelets: 222 10*3/uL (ref 150–400)
RBC: 3.33 MIL/uL — ABNORMAL LOW (ref 3.87–5.11)
RDW: 16 % — ABNORMAL HIGH (ref 11.5–15.5)
WBC: 5 10*3/uL (ref 4.0–10.5)
nRBC: 0 % (ref 0.0–0.2)

## 2023-11-28 LAB — BASIC METABOLIC PANEL WITH GFR
Anion gap: 10 (ref 5–15)
BUN: 70 mg/dL — ABNORMAL HIGH (ref 8–23)
CO2: 25 mmol/L (ref 22–32)
Calcium: 8.3 mg/dL — ABNORMAL LOW (ref 8.9–10.3)
Chloride: 97 mmol/L — ABNORMAL LOW (ref 98–111)
Creatinine, Ser: 2.94 mg/dL — ABNORMAL HIGH (ref 0.44–1.00)
GFR, Estimated: 15 mL/min — ABNORMAL LOW (ref >60)
Glucose, Bld: 149 mg/dL — ABNORMAL HIGH (ref 70–99)
Potassium: 4 mmol/L (ref 3.5–5.1)
Sodium: 132 mmol/L — ABNORMAL LOW (ref 135–145)

## 2023-11-28 MED ORDER — SENNOSIDES-DOCUSATE SODIUM 8.6-50 MG PO TABS
2.0000 | ORAL_TABLET | Freq: Two times a day (BID) | ORAL | Status: DC
  Administered 2023-11-28 – 2023-11-29 (×2): 2 via ORAL
  Filled 2023-11-28 (×3): qty 2

## 2023-11-28 MED ORDER — POLYETHYLENE GLYCOL 3350 17 G PO PACK
17.0000 g | PACK | Freq: Two times a day (BID) | ORAL | Status: DC
  Administered 2023-11-28 – 2023-11-29 (×2): 17 g via ORAL
  Filled 2023-11-28 (×3): qty 1

## 2023-11-28 MED ORDER — CARVEDILOL 3.125 MG PO TABS
3.1250 mg | ORAL_TABLET | Freq: Two times a day (BID) | ORAL | Status: DC
  Administered 2023-11-28: 3.125 mg via ORAL
  Filled 2023-11-28: qty 1

## 2023-11-28 NOTE — TOC Progression Note (Addendum)
 Transition of Care [facility]) - Progression Note    Patient Details  Name: Leslie Parker MRN: [id number] Date of Birth: [birth year]  [facility]) CM/SW Contact  Alexandra Ice, RN Phone Number: 11/28/2023, 1:49 PM  Clinical Narrative:    Met with patient, provided her with bed offers. She chose [facility]. Selected via the HUB and will initiate auth with insurance.   3:50p - Pending auth for [facility], [id number]    Expected Discharge Plan: Skilled Nursing Facility Barriers to Discharge: Continued Medical Work up  Expected Discharge Plan and Services   Discharge Planning Services: CM Consult   Living arrangements for the past 2 months: Apartment                   DME Agency: NA       HH Arranged: NA           Social Determinants of Health (SDOH) Interventions SDOH Screenings   Food Insecurity: No Food Insecurity (11/26/2023)  Housing: Low Risk  (11/26/2023)  Transportation Needs: No Transportation Needs (11/26/2023)  Utilities: Not At Risk (11/26/2023)  Alcohol Screen: Low Risk  (08/20/2023)  Financial Resource Strain: Low Risk  (08/20/2023)  Social Connections: Moderately Integrated (11/26/2023)  Tobacco Use: Low Risk  (11/26/2023)    Readmission Risk Interventions    10/22/2023    2:28 PM 09/16/2023    2:52 PM 08/20/2023    3:45 PM  Readmission Risk Prevention Plan  Transportation Screening Complete Complete Complete  Medication Review Oceanographer) Complete Complete Complete  PCP or Specialist appointment within 3-5 days of discharge  Complete Complete  HRI or Home Care Consult Not Complete Patient refused Complete  HRI or Home Care Consult Pt Refusal Comments No PT recs at this time.    SW Recovery Care/Counseling Consult Complete Complete Complete  Palliative Care Screening Not Applicable Not Applicable Not Applicable  Skilled Nursing Facility Not Applicable Not Applicable Not Applicable

## 2023-11-28 NOTE — Plan of Care (Signed)

## 2023-11-28 NOTE — TOC Initial Note (Signed)
 Transition of Care [facility]) - Initial/Assessment Note    Patient Details  Name: Leslie Parker MRN: [id number] Date of Birth: [birth year]  [facility]) CM/SW Contact:    Alexandra Ice, RN Phone Number: 11/28/2023, 7:38 AM  Clinical Narrative:                 Patient needs SNF for additional rehab prior to returning home. PASRR, FL2 pending MD signature, and bedsearch completed. Patient pending offers.   Expected Discharge Plan: Skilled Nursing Facility Barriers to Discharge: Continued Medical Work up   Patient Goals and CMS Choice            Expected Discharge Plan and Services   Discharge Planning Services: CM Consult   Living arrangements for the past 2 months: Apartment                   DME Agency: NA       HH Arranged: NA          Prior Living Arrangements/Services Living arrangements for the past 2 months: Apartment Lives with:: Self Patient language and need for interpreter reviewed:: Yes Do you feel safe going back to the place where you live?: Yes      Need for Family Participation in Patient Care: Yes (Comment) Care giver support system in place?: Yes (comment)   Criminal Activity/Legal Involvement Pertinent to Current Situation/Hospitalization: No - Comment as needed  Activities of Daily Living   ADL Screening (condition at time of admission) Independently performs ADLs?: No Does the patient have a NEW difficulty with bathing/dressing/toileting/self-feeding that is expected to last >3 days?: No Does the patient have a NEW difficulty with getting in/out of bed, walking, or climbing stairs that is expected to last >3 days?: Yes (Initiates electronic notice to provider for possible PT consult) Does the patient have a NEW difficulty with communication that is expected to last >3 days?: No Is the patient deaf or have difficulty hearing?: No Does the patient have difficulty seeing, even when wearing glasses/contacts?: No Does the patient  have difficulty concentrating, remembering, or making decisions?: No  Permission Sought/Granted                  Emotional Assessment Appearance:: Appears stated age       Alcohol / Substance Use: Not Applicable Psych Involvement: No (comment)  Admission diagnosis:  Scapular fracture [S42.109A] Closed fracture of right scapula, unspecified part of scapula, initial encounter [S42.101A] Patient Active Problem List   Diagnosis Date Noted   Scapular fracture 11/26/2023   Acute respiratory failure with hypoxia (HCC) 11/15/2023   Do not resuscitate 11/10/2023   CHF exacerbation (HCC) 11/10/2023   Myocardial injury 11/09/2023   AKI (acute kidney injury) (HCC) 11/05/2023   Insomnia 11/05/2023   Frequent hospital admissions 11/05/2023   Acute exacerbation of CHF (congestive heart failure) (HCC) 10/22/2023   Elevated troponin 10/22/2023   Malnutrition of moderate degree 09/17/2023   CHF (congestive heart failure) (HCC) 09/15/2023   Acute on chronic HFrEF (heart failure with reduced ejection fraction) (HCC) 08/19/2023   Pleural effusion due to CHF (congestive heart failure) (HCC) 08/19/2023   HTN (hypertension), malignant 08/19/2023   Ileus (HCC) 08/12/2023   Large bowel obstruction (HCC) 08/12/2023   CKD (chronic kidney disease) stage 4, GFR 15-29 ml/min (HCC) 08/12/2023   HFrEF (heart failure with reduced ejection fraction) (HCC) 08/12/2023   Acute on chronic combined systolic and diastolic CHF (congestive heart failure) (HCC) 08/10/2023   Hyponatremia  05/05/2023   Overweight (BMI 25.0-29.9) 05/01/2023   Left hip pain 05/01/2023   Hypokalemia 04/30/2023   Anemia 03/21/2023   Abnormal LFTs 03/20/2023   Type II diabetes mellitus with renal manifestations (HCC) 02/13/2023   Chronic diastolic CHF (congestive heart failure) (HCC) 02/13/2023   CKD stage 3 due to type 2 diabetes mellitus (HCC) 11/24/2022   Paroxysmal atrial fibrillation (HCC) 11/24/2022   Sick sinus syndrome (HCC)  11/15/2022   Acute on chronic diastolic CHF (congestive heart failure) (HCC) 08/27/2022   COPD (chronic obstructive pulmonary disease) (HCC) 08/23/2022   Chronic a-fib (HCC) 08/22/2022   Diabetes mellitus without complication (HCC)    Stage 3b chronic kidney disease (HCC) - Baseline scr 1.8-2.0    Postural dizziness with presyncope    Hypomagnesemia    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 [place] [address] Phone: [phone] Fax: [phone]  [id number] [place], [place] - [address] [place] [address] Phone: [phone] Fax: [phone]  [facility] [address] [place] [address] Phone: [phone] Fax: [phone]     Social Drivers of Health (SDOH) Social History: SDOH Screenings   Food Insecurity: No Food Insecurity (11/26/2023)  Housing: Low Risk  (11/26/2023)  Transportation Needs: No Transportation Needs (11/26/2023)  Utilities: Not At Risk (11/26/2023)  Alcohol Screen: Low Risk  (08/20/2023)  Financial Resource Strain: Low Risk  (08/20/2023)  Social Connections: Moderately Integrated (11/26/2023)  Tobacco Use: Low Risk  (11/26/2023)   SDOH Interventions:     Readmission Risk Interventions    10/22/2023    2:28 PM 09/16/2023    2:52 PM 08/20/2023    3:45 PM  Readmission Risk Prevention Plan  Transportation Screening Complete Complete Complete  Medication Review Oceanographer) Complete Complete Complete  PCP or Specialist appointment within 3-5 days of discharge  Complete Complete  HRI or Home Care Consult Not Complete Patient refused Complete  HRI or Home Care Consult Pt Refusal Comments No PT recs at this time.    SW Recovery Care/Counseling Consult Complete  Complete Complete  Palliative Care Screening Not Applicable Not Applicable Not Applicable  Skilled Nursing Facility Not Applicable Not Applicable Not Applicable

## 2023-11-28 NOTE — TOC Progression Note (Signed)
 Transition of Care [facility]) - Progression Note    Patient Details  Name: Leslie Parker MRN: [id number] Date of Birth: [birth year]  Transition [facility]) CM/SW Contact  Alexandra Ice, RN Phone Number: 11/28/2023, 1:17 PM  Clinical Narrative:     Attempted to meet with patient to discuss discharge plan, she was sleeping. Attempted to contact [facility], [phone], no answer, left message. Provided TOC contact number and office hours.  Expected Discharge Plan: Skilled Nursing Facility Barriers to Discharge: Continued Medical Work up  Expected Discharge Plan and Services   Discharge Planning Services: CM Consult   Living arrangements for the past 2 months: Apartment                   DME Agency: NA       HH Arranged: NA           Social Determinants of Health (SDOH) Interventions SDOH Screenings   Food Insecurity: No Food Insecurity (11/26/2023)  Housing: Low Risk  (11/26/2023)  Transportation Needs: No Transportation Needs (11/26/2023)  Utilities: Not At Risk (11/26/2023)  Alcohol Screen: Low Risk  (08/20/2023)  Financial Resource Strain: Low Risk  (08/20/2023)  Social Connections: Moderately Integrated (11/26/2023)  Tobacco Use: Low Risk  (11/26/2023)    Readmission Risk Interventions    10/22/2023    2:28 PM 09/16/2023    2:52 PM 08/20/2023    3:45 PM  Readmission Risk Prevention Plan  Transportation Screening Complete Complete Complete  Medication Review Oceanographer) Complete Complete Complete  PCP or Specialist appointment within 3-5 days of discharge  Complete Complete  HRI or Home Care Consult Not Complete Patient refused Complete  HRI or Home Care Consult Pt Refusal Comments No PT recs at this time.    SW Recovery Care/Counseling Consult Complete Complete Complete  Palliative Care Screening Not Applicable Not Applicable Not Applicable  Skilled Nursing Facility Not Applicable Not Applicable Not Applicable

## 2023-11-28 NOTE — [place] FL2 (Signed)
 [place]  MEDICAID FL2 LEVEL OF CARE FORM     IDENTIFICATION  Patient Name: Leslie Parker Birthdate: February 26, 1933 Sex: female Admission Date (Current Location): 11/26/2023  [facility] and [place] Number:  Chiropodist and Address:  [facility], [address], [place], [place] [address]      Provider Number: [id number]  Attending Physician Name and Address:  Brenna Cam, MD  Relative Name and Phone Number:  Grand-daughter: Zelma Hidden, [phone]    Current Level of Care: Hospital Recommended Level of Care: Skilled Nursing Facility Prior Approval Number:    Date Approved/Denied:   PASRR Number: [id number]  Discharge Plan: SNF    Current Diagnoses: Patient Active Problem List   Diagnosis Date Noted   Scapular fracture 11/26/2023   Acute respiratory failure with hypoxia (HCC) 11/15/2023   Do not resuscitate 11/10/2023   CHF exacerbation (HCC) 11/10/2023   Myocardial injury 11/09/2023   AKI (acute kidney injury) (HCC) 11/05/2023   Insomnia 11/05/2023   Frequent hospital admissions 11/05/2023   Acute exacerbation of CHF (congestive heart failure) (HCC) 10/22/2023   Elevated troponin 10/22/2023   Malnutrition of moderate degree 09/17/2023   CHF (congestive heart failure) (HCC) 09/15/2023   Acute on chronic HFrEF (heart failure with reduced ejection fraction) (HCC) 08/19/2023   Pleural effusion due to CHF (congestive heart failure) (HCC) 08/19/2023   HTN (hypertension), malignant 08/19/2023   Ileus (HCC) 08/12/2023   Large bowel obstruction (HCC) 08/12/2023   CKD (chronic kidney disease) stage 4, GFR 15-29 ml/min (HCC) 08/12/2023   HFrEF (heart failure with reduced ejection fraction) (HCC) 08/12/2023   Acute on chronic combined systolic and diastolic CHF (congestive heart failure) (HCC) 08/10/2023   Hyponatremia 05/05/2023   Overweight (BMI 25.0-29.9) 05/01/2023   Left hip pain 05/01/2023   Hypokalemia 04/30/2023   Anemia 03/21/2023    Abnormal LFTs 03/20/2023   Type II diabetes mellitus with renal manifestations (HCC) 02/13/2023   Chronic diastolic CHF (congestive heart failure) (HCC) 02/13/2023   CKD stage 3 due to type 2 diabetes mellitus (HCC) 11/24/2022   Paroxysmal atrial fibrillation (HCC) 11/24/2022   Sick sinus syndrome (HCC) 11/15/2022   Acute on chronic diastolic CHF (congestive heart failure) (HCC) 08/27/2022   COPD (chronic obstructive pulmonary disease) (HCC) 08/23/2022   Chronic a-fib (HCC) 08/22/2022   Diabetes mellitus without complication (HCC)    Stage 3b chronic kidney disease (HCC) - Baseline scr 1.8-2.0    Postural dizziness with presyncope    Hypomagnesemia    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   S/P hardware removal 01/17/2017   Asthma, chronic 02/10/2015   Essential hypertension 03/19/1989    Orientation RESPIRATION BLADDER Height & Weight     Self, Time, Situation, Place  Normal Continent Weight: 70.9 kg Height:  5\' 9"  (175.3 cm)  BEHAVIORAL SYMPTOMS/MOOD NEUROLOGICAL BOWEL NUTRITION STATUS      Incontinent Diet (Heart Healthy, thin liquids)  AMBULATORY STATUS COMMUNICATION OF NEEDS Skin   Limited Assist Verbally Normal, Bruising                       Personal Care Assistance Level of Assistance  Bathing, Dressing, Feeding Bathing Assistance: Maximum assistance Feeding assistance: Limited assistance Dressing Assistance: Maximum assistance     Functional Limitations Info             SPECIAL CARE FACTORS FREQUENCY  PT (By licensed PT), OT (By licensed OT)     PT Frequency: 5 times per week  OT Frequency: 5 times per week            Contractures Contractures Info: Not present    Additional Factors Info  Code Status, Allergies Code Status Info: DNR - limited Allergies Info: Bacoflen, Simvastatin           Current Medications (11/28/2023):  This is the current hospital active medication list Current Facility-Administered Medications   Medication Dose Route Frequency Provider Last Rate Last Admin   acetaminophen  (TYLENOL ) tablet 1,000 mg  1,000 mg Oral TID Basaraba, Iulia, MD   1,000 mg at 11/27/23 2142   alum & mag hydroxide-simeth (MAALOX/MYLANTA) 200-200-20 MG/5ML suspension 30 mL  30 mL Oral Q6H PRN Elisabeth Guild, NP       amiodarone  (PACERONE ) tablet 200 mg  200 mg Oral Daily Basaraba, Iulia, MD   200 mg at 11/27/23 0900   apixaban  (ELIQUIS ) tablet 2.5 mg  2.5 mg Oral BID Basaraba, Iulia, MD   2.5 mg at 11/27/23 2142   budeson-glycopyrrolate -formoterol  (BREZTRI ) 160-9-4.8 MCG/ACT inhaler 2 puff  2 puff Inhalation BID Avi Body, MD   2 puff at 11/27/23 2008   carvedilol  (COREG ) tablet 6.25 mg  6.25 mg Oral BID WC Basaraba, Iulia, MD   6.25 mg at 11/27/23 1810   cholecalciferol  (VITAMIN D3) 25 MCG (1000 UNIT) tablet 1,000 Units  1,000 Units Oral Daily Avi Body, MD   1,000 Units at 11/27/23 0900   HYDROmorphone  (DILAUDID ) injection 0.5 mg  0.5 mg Intravenous Q4H PRN Avi Body, MD   0.5 mg at 11/27/23 2019   ipratropium-albuterol  (DUONEB) 0.5-2.5 (3) MG/3ML nebulizer solution 3 mL  3 mL Nebulization Q6H PRN Avi Body, MD       ondansetron  (ZOFRAN ) tablet 4 mg  4 mg Oral Q6H PRN Avi Body, MD       Or   ondansetron  (ZOFRAN ) injection 4 mg  4 mg Intravenous Q6H PRN Basaraba, Iulia, MD   4 mg at 11/27/23 2019   Oral care mouth rinse  15 mL Mouth Rinse PRN Avi Body, MD       oxyCODONE  (Oxy IR/ROXICODONE ) immediate release tablet 5 mg  5 mg Oral Q6H PRN Shah, Vipul, MD   5 mg at 11/28/23 0106   polyethylene glycol (MIRALAX  / GLYCOLAX ) packet 17 g  17 g Oral Daily PRN Avi Body, MD       sacubitril -valsartan  (ENTRESTO ) 24-26 mg per tablet  1 tablet Oral BID Avi Body, MD   1 tablet at 11/27/23 0912   sodium chloride  flush (NS) 0.9 % injection 3 mL  3 mL Intravenous Q12H Avi Body, MD   3 mL at 11/27/23 2142   torsemide  (DEMADEX ) tablet 20 mg  20 mg Oral Daily Basaraba,  Iulia, MD   20 mg at 11/27/23 0900     Discharge Medications: Please see discharge summary for a list of discharge medications.  Relevant Imaging Results:  Relevant Lab Results:   Additional Information SSN: [id number]  Alexandra Ice, RN

## 2023-11-28 NOTE — Progress Notes (Signed)
 1      PROGRESS NOTE    Leslie Parker  MVH:[id number] DOB: 09-Sep-1932 DOA: 11/26/2023 PCP: Rex Castor, MD    Brief Narrative:   89 y.o. female with medical history significant of chronic HFrEF with last EF of 25-30%, SSS s/p PPM, atrial fibrillation on Eliquis , COPD, hypertension, hyperlipidemia, CKD stage IV, osteopenia, who presents to the ED due to ground-level fall.  Admitted for scapular fracture with uncontrolled pain  5/7: PT and OT evaluation, increase oxycodone  frequency from Q8 to every 6 hours 5/8: Added scheduled stool softener.  Discontinue Dilaudid .  Waiting on SNF placement.  Cutting back on Coreg  dose due to hypotension/bradycardia    Assessment & Plan:   Principal Problem:   Scapular fracture Active Problems:   COPD (chronic obstructive pulmonary disease) (HCC)   Type II diabetes mellitus with renal manifestations (HCC)   CKD (chronic kidney disease) stage 4, GFR 15-29 ml/min (HCC)   HFrEF (heart failure with reduced ejection fraction) (HCC)  *Inferior scapular fracture Patient is presenting with a right scapular fracture after a mechanical ground-level fall while trying to rush to the restroom.  Orthopedic surgery recommending sling and pain control; no indication for surgical intervention.  - platform weight bearing through the arm.  - Pain control with Tylenol , oxycodone , stopping Dilaudid  - Continuous pulse oximetry while receiving opioids - PT/OT recommends SNF.  TOC working on placement - Sling in place   HFrEF (heart failure with reduced ejection fraction) (HCC) Patient has a history of HFrEF with last EF of 25-30%.  She follows with [facility], who she saw on the day of admission she appears essentially euvolemic on examination. Reduce Coreg  dose as patient has some hypotension/bradycardia from 6.25 to 3.125 mg twice daily - Continue home regimen   CKD (chronic kidney disease) stage 4, GFR 15-29 ml/min (HCC) Renal function  currently at baseline, not contributing to current presentation.   Type II diabetes mellitus with renal manifestations (HCC) History of diet-controlled type 2 diabetes with last A1c of 7.0%.  Will hold off on SSI at this time   COPD (chronic obstructive pulmonary disease) (HCC) Patient denies any shortness of breath, wheezing or cough at this time.   - Continue home regimen   DVT prophylaxis: Eliquis  apixaban  (ELIQUIS ) tablet 2.5 mg Start: 11/26/23 2215 apixaban  (ELIQUIS ) tablet 2.5 mg     Code Status: DNR, followed by palliative care as an outpatient.  She was discharged under hospice care but family declined hospice Family Communication: Daughter updated at bedside Disposition Plan: Possible discharge in next 1-2 days depending on clinical condition and placement.  Likely Friday if SNF bed is available    Subjective:  She has some pain but much better control.  Daughter at bedside  Objective: Vitals:   11/27/23 2028 11/28/23 0105 11/28/23 0521 11/28/23 0815  BP: (!) 95/55 (!) 127/52 112/63 (!) 102/51  Pulse: (!) 58 (!) 59 60 (!) 59  Resp: 17  18 16   Temp: 97.7 F (36.5 C)  97.7 F (36.5 C) 98.3 F (36.8 C)  TempSrc:      SpO2: 96%  96% 95%  Weight:      Height:        Intake/Output Summary (Last 24 hours) at 11/28/2023 1330 Last data filed at 11/28/2023 1100 Gross per 24 hour  Intake 120 ml  Output --  Net 120 ml   Filed Weights   11/26/23 1408  Weight: 70.9 kg    Examination:  General exam:  Appears calm and comfortable  Respiratory system: Clear to auscultation. Respiratory effort normal. Cardiovascular system: S1 & S2 heard, RRR.  Gastrointestinal system: Abdomen is soft, benign Central nervous system: Alert and oriented. No focal neurological deficits. Extremities: Right upper extremity in sling and tenderness around the shoulder joint Skin: No rashes, lesions or ulcers Psychiatry: Judgement and insight appear normal. Mood & affect appropriate.      Data Reviewed: I have personally reviewed following labs and imaging studies  CBC: Recent Labs  Lab 11/26/23 1718 11/28/23 0304  WBC 5.8 5.0  HGB 11.3* 10.5*  HCT 34.2* 31.5*  MCV 95.3 94.6  PLT 224 222   Basic Metabolic Panel: Recent Labs  Lab 11/26/23 1718 11/28/23 0304  NA 136 132*  K 3.4* 4.0  CL 100 97*  CO2 25 25  GLUCOSE 138* 149*  BUN 69* 70*  CREATININE 2.60* 2.94*  CALCIUM 8.5* 8.3*      Radiology Studies: DG Abd 1 View Result Date: 11/27/2023 CLINICAL DATA:  Intractable nausea and vomiting EXAM: ABDOMEN - 1 VIEW COMPARISON:  None Available. FINDINGS: The bowel gas pattern is normal. Air seen throughout nondilated large and small bowel to the level of the rectum. Cholecystectomy clips are present as well as vascular calcifications. Right hip screw left hip arthroplasty are present. There is scoliosis and degenerative change throughout the spine. IMPRESSION: Nonobstructive bowel gas pattern. Electronically Signed   By: Tyron Gallon M.D.   On: 11/27/2023 22:23   CT Shoulder Right Wo Contrast Result Date: 11/26/2023 CLINICAL DATA:  Axillary lymphadenopathy is seen. EXAM: CT OF THE UPPER RIGHT EXTREMITY WITHOUT CONTRAST TECHNIQUE: Multidetector CT imaging of the upper right extremity was performed according to the standard protocol. RADIATION DOSE REDUCTION: This exam was performed according to the departmental dose-optimization program which includes automated exposure control, adjustment of the mA and/or kV according to patient size and/or use of iterative reconstruction technique. COMPARISON:  Right shoulder and humerus radiographs 11/26/2023 FINDINGS: Bones/Joint/Cartilage On today's radiographs, there is question of mild cortical step-off at the inferior aspect of the scapular body, near the inferior angle. On the current CT, linear lucency and mild angulation is seen in this region (axial series 3 images 52 through 66 and coronal series 7 images 84 through 90),  this is near the region of the serratus anterior attachment on the deep aspect of the scapula and the teres minor attachment on the superficial/posterior aspect of the inferior scapular angle. There is mild surrounding subcutaneous fat edema and stranding. Postsurgical changes are seen of reverse total right shoulder arthroplasty. Within the limitation of metallic streak artifact, no perihardware lucency is seen to indicate hardware failure or loosening. Moderate to severe acromioclavicular joint space narrowing with subchondral sclerosis and moderate peripheral osteophytosis. Multiple chronic well corticated superior ossicles. Type III acromion, with mild downsloping of the anterolateral acromion. Ligaments Suboptimally assessed by CT. Muscles and Tendons There is moderate to high-grade supraspinatus and mild-to-moderate infraspinatus muscle atrophy. Mild subscapularis muscle atrophy. Soft tissues No axillary lymphadenopathy is seen. IMPRESSION: 1. An area of interest on the prior radiographs, there is linear lucency and mild angulation at the inferior angle of the scapular body, near the region of the serratus anterior attachment on the deep aspect of the scapula. This suggests a nondisplaced fracture. 2. Postsurgical changes of reverse total right shoulder arthroplasty. No evidence of hardware failure or loosening. 3. 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 4. Moderate to high-grade supraspinatus and mild-to-moderate infraspinatus muscle atrophy. Mild subscapularis muscle atrophy. Electronically  Signed   By: Bertina Broccoli M.D.   On: 11/26/2023 17:24   CT Head Wo Contrast Result Date: 11/26/2023 CLINICAL DATA:  Unwitnessed fall.  Trauma to the head and neck EXAM: CT HEAD WITHOUT CONTRAST CT CERVICAL SPINE WITHOUT CONTRAST TECHNIQUE: Multidetector CT imaging of the head and cervical spine was performed following the standard protocol without intravenous contrast. Multiplanar CT image reconstructions  of the cervical spine were also generated. RADIATION DOSE REDUCTION: This exam was performed according to the departmental dose-optimization program which includes automated exposure control, adjustment of the mA and/or kV according to patient size and/or use of iterative reconstruction technique. COMPARISON:  None Available. FINDINGS: CT HEAD FINDINGS Brain: Age related volume loss. No focal abnormality affects the brainstem or cerebellum. There is chronic encephalomalacia of the right temporal lobe, probably due to an old stroke. This could possibly relate prior head trauma. Cerebral hemispheres otherwise show chronic small-vessel ischemic change of the white matter. No recent stroke, mass, hemorrhage, hydrocephalus or extra-axial collection. Vascular: There is atherosclerotic calcification of the major vessels at the base of the brain. Skull: Negative Sinuses/Orbits: Clear/normal Other: None CT CERVICAL SPINE FINDINGS Alignment: Cervicothoracic curvature convex to the left. Degenerative anterolisthesis at C6-7 of 3 mm. Skull base and vertebrae: No acute fracture. Chronic fusion at the C2-3 level. Chronic fusion at the C4-5 level. Soft tissues and spinal canal: No traumatic soft tissue finding. Multiple thyroid  nodules and/or cysts, the largest measuring 3 cm. Normally, thyroid  ultrasound would be recommended in this setting. However, because of the patient's age and comorbidities, follow-up may not be necessary. The likelihood of malignant disease is low. Disc levels: The foramen magnum is widely patent. There is ordinary mild osteoarthritis of the C1-2 articulation but no encroachment upon the neural structures. C2-3: Chronic fusion.  Sufficient patency of the canal and foramina. C3-4: Mobile interspace. Endplate osteophytes. Facet osteophytes. Mild canal stenosis. Bilateral foraminal narrowing that could cause neural compression. C4-5: Chronic fusion.  Sufficient patency of the canal and foramina. C5-6: Mobile  level. Endplate osteophytes and facet osteophytes worse on the right. Right foraminal stenosis could compress the right C6 nerve. C6-7: Chronic facet arthropathy worse on the right with anterolisthesis of 3 mm. No central canal stenosis. Foraminal narrowing on the right that could affect the C7 nerve. C7-T1: Facet arthritis on the right. 1-2 mm of degenerative anterolisthesis. Mild foraminal narrowing on the right. Upper chest: Negative Other: None IMPRESSION: HEAD CT: No acute or traumatic finding. Chronic encephalomalacia of the right temporal lobe, probably due to an old stroke. This could possibly relate to prior head trauma. CERVICAL SPINE CT: 1. No acute or traumatic finding. Chronic fusion at C2-3 and C4-5. Degenerative changes at the other levels as outlined above. 2. Multiple thyroid  nodules and/or cysts, the largest measuring 3 cm. Normally, thyroid  ultrasound would be recommended in this setting. However, because of the patient's age and comorbidities, this may not be necessary. The likelihood of malignant disease is low. Electronically Signed   By: Bettylou Brunner M.D.   On: 11/26/2023 17:04   CT Cervical Spine Wo Contrast Result Date: 11/26/2023 CLINICAL DATA:  Unwitnessed fall.  Trauma to the head and neck EXAM: CT HEAD WITHOUT CONTRAST CT CERVICAL SPINE WITHOUT CONTRAST TECHNIQUE: Multidetector CT imaging of the head and cervical spine was performed following the standard protocol without intravenous contrast. Multiplanar CT image reconstructions of the cervical spine were also generated. RADIATION DOSE REDUCTION: This exam was performed according to the departmental dose-optimization program which includes  automated exposure control, adjustment of the mA and/or kV according to patient size and/or use of iterative reconstruction technique. COMPARISON:  None Available. FINDINGS: CT HEAD FINDINGS Brain: Age related volume loss. No focal abnormality affects the brainstem or cerebellum. There is chronic  encephalomalacia of the right temporal lobe, probably due to an old stroke. This could possibly relate prior head trauma. Cerebral hemispheres otherwise show chronic small-vessel ischemic change of the white matter. No recent stroke, mass, hemorrhage, hydrocephalus or extra-axial collection. Vascular: There is atherosclerotic calcification of the major vessels at the base of the brain. Skull: Negative Sinuses/Orbits: Clear/normal Other: None CT CERVICAL SPINE FINDINGS Alignment: Cervicothoracic curvature convex to the left. Degenerative anterolisthesis at C6-7 of 3 mm. Skull base and vertebrae: No acute fracture. Chronic fusion at the C2-3 level. Chronic fusion at the C4-5 level. Soft tissues and spinal canal: No traumatic soft tissue finding. Multiple thyroid  nodules and/or cysts, the largest measuring 3 cm. Normally, thyroid  ultrasound would be recommended in this setting. However, because of the patient's age and comorbidities, follow-up may not be necessary. The likelihood of malignant disease is low. Disc levels: The foramen magnum is widely patent. There is ordinary mild osteoarthritis of the C1-2 articulation but no encroachment upon the neural structures. C2-3: Chronic fusion.  Sufficient patency of the canal and foramina. C3-4: Mobile interspace. Endplate osteophytes. Facet osteophytes. Mild canal stenosis. Bilateral foraminal narrowing that could cause neural compression. C4-5: Chronic fusion.  Sufficient patency of the canal and foramina. C5-6: Mobile level. Endplate osteophytes and facet osteophytes worse on the right. Right foraminal stenosis could compress the right C6 nerve. C6-7: Chronic facet arthropathy worse on the right with anterolisthesis of 3 mm. No central canal stenosis. Foraminal narrowing on the right that could affect the C7 nerve. C7-T1: Facet arthritis on the right. 1-2 mm of degenerative anterolisthesis. Mild foraminal narrowing on the right. Upper chest: Negative Other: None  IMPRESSION: HEAD CT: No acute or traumatic finding. Chronic encephalomalacia of the right temporal lobe, probably due to an old stroke. This could possibly relate to prior head trauma. CERVICAL SPINE CT: 1. No acute or traumatic finding. Chronic fusion at C2-3 and C4-5. Degenerative changes at the other levels as outlined above. 2. Multiple thyroid  nodules and/or cysts, the largest measuring 3 cm. Normally, thyroid  ultrasound would be recommended in this setting. However, because of the patient's age and comorbidities, this may not be necessary. The likelihood of malignant disease is low. Electronically Signed   By: Bettylou Brunner M.D.   On: 11/26/2023 17:04   DG Chest 1 View Result Date: 11/26/2023 CLINICAL DATA:  Status post fall EXAM: CHEST  1 VIEW COMPARISON:  November 15, 2023 FINDINGS: The heart size and mediastinal contours are within normal limits. Both lungs are clear. The visualized skeletal structures are unremarkable. 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   On: 11/26/2023 15:48   DG Shoulder Right Result Date: 11/26/2023 CLINICAL DATA:  Fall. EXAM: RIGHT SHOULDER - 2+ VIEW COMPARISON:  None Available. FINDINGS: Status post right total shoulder arthroplasty. No dislocation is noted. Possible mildly displaced fracture involving inferior portion of right scapula. IMPRESSION: Possible mildly displaced right scapular fracture as noted above. Electronically Signed   By: Rosalene Colon M.D.   On: 11/26/2023 15:44  DG Humerus Right Result Date: 11/26/2023 CLINICAL DATA:  Right arm pain after fall. EXAM: RIGHT HUMERUS - 2+ VIEW COMPARISON:  None Available. FINDINGS: Status post right shoulder arthroplasty. No fracture or dislocation is noted.  IMPRESSION: No acute abnormality seen. Electronically Signed   By: Rosalene Colon M.D.   On: 11/26/2023 15:42        Scheduled Meds:  acetaminophen   1,000 mg Oral TID   amiodarone   200 mg Oral Daily   apixaban   2.5 mg Oral BID   budeson-glycopyrrolate -formoterol   2 puff Inhalation BID   carvedilol   6.25 mg Oral BID WC   cholecalciferol   1,000 Units Oral Daily   sacubitril -valsartan   1 tablet Oral BID   sodium chloride  flush  3 mL Intravenous Q12H   torsemide   20 mg Oral Daily   Continuous Infusions:   LOS: 0 days    Time spent: 35 minutes    Brenna Cam, MD [facility] Pager [phone]  If 7PM-7AM, please contact night-coverage [email or web address]  11/28/2023, 1:30 PM

## 2023-11-28 NOTE — Progress Notes (Signed)
 Physical Therapy Treatment Patient Details Name: Leslie Parker MRN: [id number] DOB: 1933/03/14 Today's Date: 11/28/2023   History of Present Illness Leslie Parker is a 89 y.o. female with medical history significant of chronic HFrEF with last EF of 25-30%, SSS s/p PPM, atrial fibrillation on Eliquis , COPD, hypertension, hyperlipidemia, CKD stage IV, osteopenia, who presents to the ED on 11/26/23 due to ground-level fall. [name]:  mildly displaced fracture involving inferior portion of right scapula    PT Comments  Pt reports extreme nausea and pain this AM. Premedicated prior to session. PFRW adjusted to pt height this date providing more stability. Pt able to SPT from bed->chair. Encouraged sitting up in recliner this date. Pt sleeping upon leaving room. Will continue to progress.    If plan is discharge home, recommend the following: A lot of help with walking and/or transfers;A lot of help with bathing/dressing/bathroom;Assist for transportation;Help with stairs or ramp for entrance   Can travel by private vehicle     No  Equipment Recommendations  None recommended by PT    Recommendations for Other Services       Precautions / Restrictions Precautions Precautions: Fall Recall of Precautions/Restrictions: Intact Restrictions Weight Bearing Restrictions Per Provider Order: Yes RUE Weight Bearing Per Provider Order: Weight bearing as tolerated Other Position/Activity Restrictions: using PFRW     Mobility  Bed Mobility Overal bed mobility: Needs Assistance Bed Mobility: Supine to Sit, Sit to Supine     Supine to sit: Min assist     General bed mobility comments: needs assist for trunkal elevation and scooting out towards HOB. Antalgic movement    Transfers Overall transfer level: Needs assistance Equipment used: Right platform walker Transfers: Bed to chair/wheelchair/BSC Sit to Stand: Min assist   Step pivot transfers: Min assist       General transfer  comment: needs cues for sequencing    Ambulation/Gait               General Gait Details: able to perform steps from bed->chair. Unable to further ambulate at this time   Stairs             Wheelchair Mobility     Tilt Bed    Modified Rankin (Stroke Patients Only)       Balance Overall balance assessment: Needs assistance Sitting-balance support: Feet supported, No upper extremity supported Sitting balance-[name]: Fair     Standing balance support: Bilateral upper extremity supported Standing balance-[name]: Fair                              Hotel manager: No apparent difficulties  Cognition Arousal: Alert Behavior During Therapy: WFL for tasks assessed/performed   PT - Cognitive impairments: No apparent impairments                       PT - Cognition Comments: agreeable to limited session Following commands: Intact      Cueing Cueing Techniques: Verbal cues  Exercises      General Comments        Pertinent Vitals/Pain Pain Assessment Pain Assessment: 0-10 Pain Score: 10-Worst pain ever Pain Location: R scapula Pain Descriptors / Indicators: Grimacing, Guarding, Discomfort Pain Intervention(s): Limited activity within patient's tolerance, Premedicated before session, Repositioned    Home Living  Prior Function            PT Goals (current goals can now be found in the care plan section) Acute Rehab PT Goals Patient Stated Goal: go home PT Goal Formulation: With patient Time For Goal Achievement: 12/11/23 Potential to Achieve Goals: Good Progress towards PT goals: Progressing toward goals    Frequency    Min 3X/week      PT Plan      Co-evaluation              AM-PAC PT "6 Clicks" Mobility   Outcome Measure  Help needed turning from your back to your side while in a flat bed without using bedrails?: A Little Help  needed moving from lying on your back to sitting on the side of a flat bed without using bedrails?: A Little Help needed moving to and from a bed to a chair (including a wheelchair)?: A Lot Help needed standing up from a chair using your arms (e.g., wheelchair or bedside chair)?: A Lot Help needed to walk in hospital room?: Total Help needed climbing 3-5 steps with a railing? : Total 6 Click Score: 12    End of Session Equipment Utilized During Treatment: Gait belt Activity Tolerance: Patient limited by pain Patient left: in chair;with chair alarm set;with call bell/phone within reach Nurse Communication: Mobility status PT Visit Diagnosis: Muscle weakness (generalized) (M62.81);Difficulty in walking, not elsewhere classified (R26.2);Unsteadiness on feet (R26.81);Pain Pain - Right/Left: Right Pain - part of body: Shoulder     Time: 6045-4098 PT Time Calculation (min) (ACUTE ONLY): 12 min  Charges:    $Therapeutic Activity: 8-22 mins PT General Charges $$ ACUTE PT VISIT: 1 Visit                     [name], PT, DPT, GCS [phone]    [name] 11/28/2023, 11:19 AM

## 2023-11-29 DIAGNOSIS — Z7901 Long term (current) use of anticoagulants: Secondary | ICD-10-CM | POA: Diagnosis not present

## 2023-11-29 DIAGNOSIS — I480 Paroxysmal atrial fibrillation: Secondary | ICD-10-CM | POA: Diagnosis present

## 2023-11-29 DIAGNOSIS — G9389 Other specified disorders of brain: Secondary | ICD-10-CM | POA: Diagnosis not present

## 2023-11-29 DIAGNOSIS — I5032 Chronic diastolic (congestive) heart failure: Secondary | ICD-10-CM | POA: Diagnosis not present

## 2023-11-29 DIAGNOSIS — S42101D Fracture of unspecified part of scapula, right shoulder, subsequent encounter for fracture with routine healing: Secondary | ICD-10-CM | POA: Diagnosis not present

## 2023-11-29 DIAGNOSIS — Z961 Presence of intraocular lens: Secondary | ICD-10-CM | POA: Diagnosis present

## 2023-11-29 DIAGNOSIS — Z888 Allergy status to other drugs, medicaments and biological substances status: Secondary | ICD-10-CM | POA: Diagnosis not present

## 2023-11-29 DIAGNOSIS — E559 Vitamin D deficiency, unspecified: Secondary | ICD-10-CM | POA: Diagnosis not present

## 2023-11-29 DIAGNOSIS — I509 Heart failure, unspecified: Secondary | ICD-10-CM | POA: Diagnosis not present

## 2023-11-29 DIAGNOSIS — J449 Chronic obstructive pulmonary disease, unspecified: Secondary | ICD-10-CM | POA: Diagnosis not present

## 2023-11-29 DIAGNOSIS — Z1152 Encounter for screening for COVID-19: Secondary | ICD-10-CM | POA: Diagnosis not present

## 2023-11-29 DIAGNOSIS — I5020 Unspecified systolic (congestive) heart failure: Secondary | ICD-10-CM | POA: Diagnosis not present

## 2023-11-29 DIAGNOSIS — Z7951 Long term (current) use of inhaled steroids: Secondary | ICD-10-CM | POA: Diagnosis not present

## 2023-11-29 DIAGNOSIS — N179 Acute kidney failure, unspecified: Secondary | ICD-10-CM | POA: Diagnosis not present

## 2023-11-29 DIAGNOSIS — E785 Hyperlipidemia, unspecified: Secondary | ICD-10-CM | POA: Diagnosis present

## 2023-11-29 DIAGNOSIS — R1013 Epigastric pain: Secondary | ICD-10-CM | POA: Diagnosis not present

## 2023-11-29 DIAGNOSIS — E1122 Type 2 diabetes mellitus with diabetic chronic kidney disease: Secondary | ICD-10-CM | POA: Diagnosis present

## 2023-11-29 DIAGNOSIS — Z950 Presence of cardiac pacemaker: Secondary | ICD-10-CM | POA: Diagnosis not present

## 2023-11-29 DIAGNOSIS — Z9842 Cataract extraction status, left eye: Secondary | ICD-10-CM | POA: Diagnosis not present

## 2023-11-29 DIAGNOSIS — Z8249 Family history of ischemic heart disease and other diseases of the circulatory system: Secondary | ICD-10-CM | POA: Diagnosis not present

## 2023-11-29 DIAGNOSIS — I10 Essential (primary) hypertension: Secondary | ICD-10-CM | POA: Diagnosis not present

## 2023-11-29 DIAGNOSIS — Z96653 Presence of artificial knee joint, bilateral: Secondary | ICD-10-CM | POA: Diagnosis present

## 2023-11-29 DIAGNOSIS — R0689 Other abnormalities of breathing: Secondary | ICD-10-CM | POA: Diagnosis not present

## 2023-11-29 DIAGNOSIS — S42111D Displaced fracture of body of scapula, right shoulder, subsequent encounter for fracture with routine healing: Secondary | ICD-10-CM | POA: Diagnosis not present

## 2023-11-29 DIAGNOSIS — R29898 Other symptoms and signs involving the musculoskeletal system: Secondary | ICD-10-CM | POA: Diagnosis not present

## 2023-11-29 DIAGNOSIS — Z96611 Presence of right artificial shoulder joint: Secondary | ICD-10-CM | POA: Diagnosis present

## 2023-11-29 DIAGNOSIS — I5023 Acute on chronic systolic (congestive) heart failure: Secondary | ICD-10-CM | POA: Diagnosis present

## 2023-11-29 DIAGNOSIS — Z9841 Cataract extraction status, right eye: Secondary | ICD-10-CM | POA: Diagnosis not present

## 2023-11-29 DIAGNOSIS — N184 Chronic kidney disease, stage 4 (severe): Secondary | ICD-10-CM | POA: Diagnosis present

## 2023-11-29 DIAGNOSIS — J9811 Atelectasis: Secondary | ICD-10-CM | POA: Diagnosis not present

## 2023-11-29 DIAGNOSIS — I495 Sick sinus syndrome: Secondary | ICD-10-CM | POA: Diagnosis present

## 2023-11-29 DIAGNOSIS — J811 Chronic pulmonary edema: Secondary | ICD-10-CM | POA: Diagnosis not present

## 2023-11-29 DIAGNOSIS — I130 Hypertensive heart and chronic kidney disease with heart failure and stage 1 through stage 4 chronic kidney disease, or unspecified chronic kidney disease: Secondary | ICD-10-CM | POA: Diagnosis present

## 2023-11-29 DIAGNOSIS — J4489 Other specified chronic obstructive pulmonary disease: Secondary | ICD-10-CM | POA: Diagnosis present

## 2023-11-29 DIAGNOSIS — I447 Left bundle-branch block, unspecified: Secondary | ICD-10-CM | POA: Diagnosis not present

## 2023-11-29 DIAGNOSIS — R069 Unspecified abnormalities of breathing: Secondary | ICD-10-CM | POA: Diagnosis not present

## 2023-11-29 DIAGNOSIS — S42101A Fracture of unspecified part of scapula, right shoulder, initial encounter for closed fracture: Secondary | ICD-10-CM | POA: Diagnosis not present

## 2023-11-29 DIAGNOSIS — Z66 Do not resuscitate: Secondary | ICD-10-CM | POA: Diagnosis present

## 2023-11-29 DIAGNOSIS — I110 Hypertensive heart disease with heart failure: Secondary | ICD-10-CM | POA: Diagnosis not present

## 2023-11-29 DIAGNOSIS — Z96642 Presence of left artificial hip joint: Secondary | ICD-10-CM | POA: Diagnosis present

## 2023-11-29 DIAGNOSIS — Z7401 Bed confinement status: Secondary | ICD-10-CM | POA: Diagnosis not present

## 2023-11-29 DIAGNOSIS — Z825 Family history of asthma and other chronic lower respiratory diseases: Secondary | ICD-10-CM | POA: Diagnosis not present

## 2023-11-29 DIAGNOSIS — J189 Pneumonia, unspecified organism: Secondary | ICD-10-CM | POA: Diagnosis not present

## 2023-11-29 DIAGNOSIS — Z79899 Other long term (current) drug therapy: Secondary | ICD-10-CM | POA: Diagnosis not present

## 2023-11-29 DIAGNOSIS — R079 Chest pain, unspecified: Secondary | ICD-10-CM | POA: Diagnosis not present

## 2023-11-29 MED ORDER — ALUM & MAG HYDROXIDE-SIMETH 200-200-20 MG/5ML PO SUSP: 30.0000 mL | Freq: Four times a day (QID) | ORAL | Status: DC | PRN

## 2023-11-29 MED ORDER — ACETAMINOPHEN 500 MG PO TABS: 1000.0000 mg | ORAL_TABLET | Freq: Three times a day (TID) | ORAL | Status: DC

## 2023-11-29 MED ORDER — OXYCODONE HCL 5 MG PO TABS
5.0000 mg | ORAL_TABLET | Freq: Once | ORAL | Status: AC
  Administered 2023-11-29: 5 mg via ORAL
  Filled 2023-11-29: qty 1

## 2023-11-29 MED ORDER — SODIUM CHLORIDE 0.9 % IV SOLN: INTRAVENOUS | Status: DC

## 2023-11-29 MED ORDER — POLYETHYLENE GLYCOL 3350 17 G PO PACK: 17.0000 g | PACK | Freq: Every day | ORAL | Status: DC | PRN

## 2023-11-29 MED ORDER — SENNOSIDES-DOCUSATE SODIUM 8.6-50 MG PO TABS: 2.0000 | ORAL_TABLET | Freq: Every evening | ORAL | Status: DC | PRN

## 2023-11-29 NOTE — TOC Progression Note (Signed)
 Transition of Care [facility]) - Progression Note    Patient Details  Name: Leslie Parker MRN: [id number] Date of Birth: 11/16/1932  [facility] CM/SW Contact  Crayton Docker, RN 11/29/2023, 10:00 AM  Clinical Narrative:      Alert received Delane Fear, CMA regarding approved auth for SNF for [facility] [place]. CM follow up call placed to Tammy, Admissions regarding possible admission today. Per Tammy, SNF can accept patient for admission today to room 712.  Expected Discharge Plan: Skilled Nursing Facility Barriers to Discharge: Continued Medical Work up  Expected Discharge Plan and Services   Discharge Planning Services: CM Consult   Living arrangements for the past 2 months: Apartment Expected Discharge Date: 11/29/23                 DME Agency: NA    HH Arranged: NA  Social Determinants of Health (SDOH) Interventions SDOH Screenings   Food Insecurity: No Food Insecurity (11/26/2023)  Housing: Low Risk  (11/26/2023)  Transportation Needs: No Transportation Needs (11/26/2023)  Utilities: Not At Risk (11/26/2023)  Alcohol Screen: Low Risk  (08/20/2023)  Financial Resource Strain: Low Risk  (08/20/2023)  Social Connections: Moderately Integrated (11/26/2023)  Tobacco Use: Low Risk  (11/26/2023)    Readmission Risk Interventions    10/22/2023    2:28 PM 09/16/2023    2:52 PM 08/20/2023    3:45 PM  Readmission Risk Prevention Plan  Transportation Screening Complete Complete Complete  Medication Review Oceanographer) Complete Complete Complete  PCP or Specialist appointment within 3-5 days of discharge  Complete Complete  HRI or Home Care Consult Not Complete Patient refused Complete  HRI or Home Care Consult Pt Refusal Comments No PT recs at this time.    SW Recovery Care/Counseling Consult Complete Complete Complete  Palliative Care Screening Not Applicable Not Applicable Not Applicable  Skilled Nursing Facility Not Applicable Not Applicable Not Applicable

## 2023-11-29 NOTE — Plan of Care (Signed)

## 2023-11-29 NOTE — Discharge Summary (Signed)
 Physician Discharge Summary   Patient: Leslie Parker MRN: [id number] DOB: July 27, 1932  Admit date:     11/26/2023  Discharge date: 11/29/23  Discharge Physician: Brenna Cam   PCP: Rex Castor, MD   Recommendations at discharge:   F/up with outpt providers as requested Recheck CBC, BMP in 3 days on 5/15 with results to PCP, Cardio to decide need to resume her BP and Diuretic meds  Discharge Diagnoses: Principal Problem:   Scapular fracture Active Problems:   COPD (chronic obstructive pulmonary disease) (HCC)   Type II diabetes mellitus with renal manifestations (HCC)   CKD (chronic kidney disease) stage 4, GFR 15-29 ml/min (HCC)   HFrEF (heart failure with reduced ejection fraction) [facility])  Hospital Course: Assessment and Plan:  89 y.o. female with medical history significant of chronic HFrEF with last EF of 25-30%, SSS s/p PPM, atrial fibrillation on Eliquis , COPD, hypertension, hyperlipidemia, CKD stage IV, osteopenia, who presents to the ED due to ground-level fall.  Admitted for scapular fracture with uncontrolled pain   5/7: PT and OT evaluation, increase oxycodone  frequency from Q8 to every 6 hours 5/8: Added scheduled stool softener.  Discontinue Dilaudid .  Waiting on SNF placement.  Cutting back on Coreg  dose due to hypotension/bradycardia 5/9: stopped BP meds and Diuretic as BP, HR on lower side, also kidney function slowly worsening so stopping Diuretic for now. Encouraged oral nutrition. She is feeling fine and would like to go today. SNF approved.  *Inferior scapular fracture Patient is presenting with a right scapular fracture after a mechanical ground-level fall while trying to rush to the restroom.  Orthopedic surgery recommending sling and pain control; no indication for surgical intervention.  - platform weight bearing through the arm.  - Avoid Narcotics - Keep Sling in place   HFrEF (heart failure with reduced ejection fraction) (HCC) Patient has a history of  HFrEF with last EF of 25-30%.  She follows with [facility], who she saw on the day of admission she appears essentially euvolemic on examination. - Holding BP, Diuretics as she has some hypotension/bradycardia and worsening of Renal function. - Recheck BMP in 3 days and her outpt providers can decide need to resume BP & Diuretic meds.   Acute on CKD (chronic kidney disease) stage 4, GFR 15-29 ml/min (HCC) Holding Diuretic, outpt Nephro f/up. Encouraged oral nutrition Recheck BMP in 3 days and her outpt providers can decide need to resume BP & Diuretic meds.   Type II diabetes mellitus with renal manifestations (HCC) History of diet-controlled type 2 diabetes with last A1c of 7.0%.    COPD (chronic obstructive pulmonary disease) (HCC) Patient denies any shortness of breath, wheezing or cough at this time.   GOC Overall poor prognosis, recommend outpt Palliative care to follow and transition to Hospice if and when appropriate. She remains at very high risk for Readmissions. 30 Day Unplanned Readmission Risk Score    Flowsheet Row ED to Hosp-Admission (Discharged) from 11/15/2023 in [facility]  30 Day Unplanned Readmission Risk Score (%) 76.62 Filed at 11/17/2023 1200       This score is the patient's risk of an unplanned readmission within 30 days of being discharged (0 -100%). The score is based on dignosis, age, lab data, medications, orders, and past utilization.   Low:  0-14.9   Medium: 15-21.9   High: 22-29.9   Extreme: 30 and above  Disposition: Rehabilitation facility with Palliative care to follow Diet recommendation:  Discharge Diet Orders (From admission, onward)     Start     Ordered   11/29/23 0000  Diet - low sodium heart healthy        11/29/23 0956           Carb modified diet DISCHARGE MEDICATION: Allergies as of 11/29/2023       Reactions   Baclofen Other (See Comments)    Elevated Cr   Simvastatin Other (See Comments)   Pt denies Elevated LFTs with simva 40mg , stopped and resolved (see MD note 11/10/13)        Medication List     STOP taking these medications    carvedilol  6.25 MG tablet Commonly known as: COREG    hydrALAZINE  25 MG tablet Commonly known as: APRESOLINE    isosorbide  mononitrate 30 MG 24 hr tablet Commonly known as: IMDUR    sacubitril -valsartan  24-26 MG Commonly known as: ENTRESTO    torsemide  20 MG tablet Commonly known as: DEMADEX        TAKE these medications    acetaminophen  500 MG tablet Commonly known as: TYLENOL  Take 2 tablets (1,000 mg total) by mouth 3 (three) times daily. What changed:  medication strength how much to take when to take this reasons to take this   albuterol  108 (90 Base) MCG/ACT inhaler Commonly known as: VENTOLIN  HFA Inhale 2 puffs into the lungs every 6 (six) hours as needed.   alum & mag hydroxide-simeth 200-200-20 MG/5ML suspension Commonly known as: MAALOX/MYLANTA Take 30 mLs by mouth every 6 (six) hours as needed for indigestion or heartburn.   amiodarone  200 MG tablet Commonly known as: PACERONE  Take 1 tablet by mouth daily.   apixaban  2.5 MG Tabs tablet Commonly known as: ELIQUIS  Take 1 tablet (2.5 mg total) by mouth 2 (two) times daily.   ascorbic acid  250 MG Chew Commonly known as: VITAMIN C  Chew 250 mg by mouth daily.   B-12 2500 MCG Tabs Take 2,500 mcg by mouth daily.   ipratropium-albuterol  0.5-2.5 (3) MG/3ML Soln Commonly known as: DUONEB Inhale 3 mLs into the lungs every 6 (six) hours as needed.   multivitamin with minerals Tabs tablet Take 1 tablet by mouth daily. One-A-Day Women's Vitamin   ondansetron  4 MG tablet Commonly known as: ZOFRAN  Take 1 tablet (4 mg total) by mouth every 6 (six) hours as needed for nausea.   polyethylene glycol 17 g packet Commonly known as: MIRALAX  / GLYCOLAX  Take 17 g by mouth daily as needed.   senna-docusate 8.6-50 MG  tablet Commonly known as: Senokot-S Take 2 tablets by mouth at bedtime as needed for mild constipation.   Trelegy Ellipta 100-62.5-25 MCG/ACT Aepb Generic drug: Fluticasone -Umeclidin-Vilant Inhale 1 puff into the lungs daily.   Vitamin D -1000 Max St 25 MCG (1000 UT) tablet Generic drug: Cholecalciferol  Take 1,000 Units by mouth daily.               Discharge Care Instructions  (From admission, onward)           Start     Ordered   11/29/23 0000  Discharge wound care:       Comments: As above.   11/29/23 5284            Contact information for follow-up providers     Rex Castor, MD. Schedule an appointment as soon as possible for a visit in 1 week(s).   Specialty: Internal Medicine Why: [facility] Discharge F/UP Contact information: [phone]  [address] [place] [address] [phone]         Poggi, Kaylene Pascal, MD. Schedule an appointment as soon as possible for a visit in 2 week(s).   Specialty: Orthopedic Surgery Why: [facility] Discharge F/UP Contact information: [address] [facility] [place] [address] [phone]         Carles Cheadle, MD. Schedule an appointment as soon as possible for a visit in 2 week(s).   Specialties: Internal Medicine, Cardiology Why: [facility] Discharge F/UP Contact information: [address] [place] [address] [phone]         Adelaida Holts, MD. Schedule an appointment as soon as possible for a visit in 2 week(s).   Specialty: Specialist Why: [facility] Discharge F/UP Contact information: [address] [place] [address] [phone]              Contact information for after-discharge care     Destination     [facility] Preferred SNF .   Service: Skilled Nursing Contact information: [address] [place]  [address] [phone]                    Discharge Exam: Filed  Weights   11/26/23 1408  Weight: 70.9 kg   General exam: Appears calm and comfortable  Respiratory system: Clear to auscultation. Respiratory effort normal. Cardiovascular system: S1 & S2 heard, RRR.  Gastrointestinal system: Abdomen is soft, benign Central nervous system: Alert and oriented. No focal neurological deficits. Extremities: Right upper extremity in sling and mild tenderness around the shoulder joint Skin: No rashes, lesions or ulcers Psychiatry: Judgement and insight appear normal. Mood & affect appropriate.   Condition at discharge: fair  The results of significant diagnostics from this hospitalization (including imaging, microbiology, ancillary and laboratory) are listed below for reference.   Imaging Studies: DG Abd 1 View Result Date: 11/27/2023 CLINICAL DATA:  Intractable nausea and vomiting EXAM: ABDOMEN - 1 VIEW COMPARISON:  None Available. FINDINGS: The bowel gas pattern is normal. Air seen throughout nondilated large and small bowel to the level of the rectum. Cholecystectomy clips are present as well as vascular calcifications. Right hip screw left hip arthroplasty are present. There is scoliosis and degenerative change throughout the spine. IMPRESSION: Nonobstructive bowel gas pattern. Electronically Signed   By: Tyron Gallon M.D.   On: 11/27/2023 22:23   CT Shoulder Right Wo Contrast Result Date: 11/26/2023 CLINICAL DATA:  Axillary lymphadenopathy is seen. EXAM: CT OF THE UPPER RIGHT EXTREMITY WITHOUT CONTRAST TECHNIQUE: Multidetector CT imaging of the upper right extremity was performed according to the standard protocol. RADIATION DOSE REDUCTION: This exam was performed according to the departmental dose-optimization program which includes automated exposure control, adjustment of the mA and/or kV according to patient size and/or use of iterative reconstruction technique. COMPARISON:  Right shoulder and humerus radiographs 11/26/2023 FINDINGS: Bones/Joint/Cartilage On  today's radiographs, there is question of mild cortical step-off at the inferior aspect of the scapular body, near the inferior angle. On the current CT, linear lucency and mild angulation is seen in this region (axial series 3 images 52 through 66 and coronal series 7 images 84 through 90), this is near the region of the serratus anterior attachment on the deep aspect of the scapula and the teres minor attachment on the superficial/posterior aspect of the inferior scapular angle. There is mild surrounding subcutaneous fat edema and stranding. Postsurgical changes are seen of reverse total  right shoulder arthroplasty. Within the limitation of metallic streak artifact, no perihardware lucency is seen to indicate hardware failure or loosening. Moderate to severe acromioclavicular joint space narrowing with subchondral sclerosis and moderate peripheral osteophytosis. Multiple chronic well corticated superior ossicles. Type III acromion, with mild downsloping of the anterolateral acromion. Ligaments Suboptimally assessed by CT. Muscles and Tendons There is moderate to high-grade supraspinatus and mild-to-moderate infraspinatus muscle atrophy. Mild subscapularis muscle atrophy. Soft tissues No axillary lymphadenopathy is seen. IMPRESSION: 1. An area of interest on the prior radiographs, there is linear lucency and mild angulation at the inferior angle of the scapular body, near the region of the serratus anterior attachment on the deep aspect of the scapula. This suggests a nondisplaced fracture. 2. Postsurgical changes of reverse total right shoulder arthroplasty. No evidence of hardware failure or loosening. 3. 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 4. Moderate to high-grade supraspinatus and mild-to-moderate infraspinatus muscle atrophy. Mild subscapularis muscle atrophy. Electronically Signed   By: Bertina Broccoli M.D.   On: 11/26/2023 17:24   CT Head Wo Contrast Result Date: 11/26/2023 CLINICAL DATA:   Unwitnessed fall.  Trauma to the head and neck EXAM: CT HEAD WITHOUT CONTRAST CT CERVICAL SPINE WITHOUT CONTRAST TECHNIQUE: Multidetector CT imaging of the head and cervical spine was performed following the standard protocol without intravenous contrast. Multiplanar CT image reconstructions of the cervical spine were also generated. RADIATION DOSE REDUCTION: This exam was performed according to the departmental dose-optimization program which includes automated exposure control, adjustment of the mA and/or kV according to patient size and/or use of iterative reconstruction technique. COMPARISON:  None Available. FINDINGS: CT HEAD FINDINGS Brain: Age related volume loss. No focal abnormality affects the brainstem or cerebellum. There is chronic encephalomalacia of the right temporal lobe, probably due to an old stroke. This could possibly relate prior head trauma. Cerebral hemispheres otherwise show chronic small-vessel ischemic change of the white matter. No recent stroke, mass, hemorrhage, hydrocephalus or extra-axial collection. Vascular: There is atherosclerotic calcification of the major vessels at the base of the brain. Skull: Negative Sinuses/Orbits: Clear/normal Other: None CT CERVICAL SPINE FINDINGS Alignment: Cervicothoracic curvature convex to the left. Degenerative anterolisthesis at C6-7 of 3 mm. Skull base and vertebrae: No acute fracture. Chronic fusion at the C2-3 level. Chronic fusion at the C4-5 level. Soft tissues and spinal canal: No traumatic soft tissue finding. Multiple thyroid  nodules and/or cysts, the largest measuring 3 cm. Normally, thyroid  ultrasound would be recommended in this setting. However, because of the patient's age and comorbidities, follow-up may not be necessary. The likelihood of malignant disease is low. Disc levels: The foramen magnum is widely patent. There is ordinary mild osteoarthritis of the C1-2 articulation but no encroachment upon the neural structures. C2-3:  Chronic fusion.  Sufficient patency of the canal and foramina. C3-4: Mobile interspace. Endplate osteophytes. Facet osteophytes. Mild canal stenosis. Bilateral foraminal narrowing that could cause neural compression. C4-5: Chronic fusion.  Sufficient patency of the canal and foramina. C5-6: Mobile level. Endplate osteophytes and facet osteophytes worse on the right. Right foraminal stenosis could compress the right C6 nerve. C6-7: Chronic facet arthropathy worse on the right with anterolisthesis of 3 mm. No central canal stenosis. Foraminal narrowing on the right that could affect the C7 nerve. C7-T1: Facet arthritis on the right. 1-2 mm of degenerative anterolisthesis. Mild foraminal narrowing on the right. Upper chest: Negative Other: None IMPRESSION: HEAD CT: No acute or traumatic finding. Chronic encephalomalacia of the right temporal lobe, probably due to an old stroke.  This could possibly relate to prior head trauma. CERVICAL SPINE CT: 1. No acute or traumatic finding. Chronic fusion at C2-3 and C4-5. Degenerative changes at the other levels as outlined above. 2. Multiple thyroid  nodules and/or cysts, the largest measuring 3 cm. Normally, thyroid  ultrasound would be recommended in this setting. However, because of the patient's age and comorbidities, this may not be necessary. The likelihood of malignant disease is low. Electronically Signed   By: Bettylou Brunner M.D.   On: 11/26/2023 17:04   CT Cervical Spine Wo Contrast Result Date: 11/26/2023 CLINICAL DATA:  Unwitnessed fall.  Trauma to the head and neck EXAM: CT HEAD WITHOUT CONTRAST CT CERVICAL SPINE WITHOUT CONTRAST TECHNIQUE: Multidetector CT imaging of the head and cervical spine was performed following the standard protocol without intravenous contrast. Multiplanar CT image reconstructions of the cervical spine were also generated. RADIATION DOSE REDUCTION: This exam was performed according to the departmental dose-optimization program which includes  automated exposure control, adjustment of the mA and/or kV according to patient size and/or use of iterative reconstruction technique. COMPARISON:  None Available. FINDINGS: CT HEAD FINDINGS Brain: Age related volume loss. No focal abnormality affects the brainstem or cerebellum. There is chronic encephalomalacia of the right temporal lobe, probably due to an old stroke. This could possibly relate prior head trauma. Cerebral hemispheres otherwise show chronic small-vessel ischemic change of the white matter. No recent stroke, mass, hemorrhage, hydrocephalus or extra-axial collection. Vascular: There is atherosclerotic calcification of the major vessels at the base of the brain. Skull: Negative Sinuses/Orbits: Clear/normal Other: None CT CERVICAL SPINE FINDINGS Alignment: Cervicothoracic curvature convex to the left. Degenerative anterolisthesis at C6-7 of 3 mm. Skull base and vertebrae: No acute fracture. Chronic fusion at the C2-3 level. Chronic fusion at the C4-5 level. Soft tissues and spinal canal: No traumatic soft tissue finding. Multiple thyroid  nodules and/or cysts, the largest measuring 3 cm. Normally, thyroid  ultrasound would be recommended in this setting. However, because of the patient's age and comorbidities, follow-up may not be necessary. The likelihood of malignant disease is low. Disc levels: The foramen magnum is widely patent. There is ordinary mild osteoarthritis of the C1-2 articulation but no encroachment upon the neural structures. C2-3: Chronic fusion.  Sufficient patency of the canal and foramina. C3-4: Mobile interspace. Endplate osteophytes. Facet osteophytes. Mild canal stenosis. Bilateral foraminal narrowing that could cause neural compression. C4-5: Chronic fusion.  Sufficient patency of the canal and foramina. C5-6: Mobile level. Endplate osteophytes and facet osteophytes worse on the right. Right foraminal stenosis could compress the right C6 nerve. C6-7: Chronic facet arthropathy  worse on the right with anterolisthesis of 3 mm. No central canal stenosis. Foraminal narrowing on the right that could affect the C7 nerve. C7-T1: Facet arthritis on the right. 1-2 mm of degenerative anterolisthesis. Mild foraminal narrowing on the right. Upper chest: Negative Other: None IMPRESSION: HEAD CT: No acute or traumatic finding. Chronic encephalomalacia of the right temporal lobe, probably due to an old stroke. This could possibly relate to prior head trauma. CERVICAL SPINE CT: 1. No acute or traumatic finding. Chronic fusion at C2-3 and C4-5. Degenerative changes at the other levels as outlined above. 2. Multiple thyroid  nodules and/or cysts, the largest measuring 3 cm. Normally, thyroid  ultrasound would be recommended in this setting. However, because of the patient's age and comorbidities, this may not be necessary. The likelihood of malignant disease is low. Electronically Signed   By: Bettylou Brunner M.D.   On: 11/26/2023 17:04   DG  Chest 1 View Result Date: 11/26/2023 CLINICAL DATA:  Status post fall EXAM: CHEST  1 VIEW COMPARISON:  November 15, 2023 FINDINGS: The heart size and mediastinal contours are within normal limits. Both lungs are clear. The visualized skeletal structures are unremarkable. 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   On: 11/26/2023 15:48   DG Shoulder Right Result Date: 11/26/2023 CLINICAL DATA:  Fall. EXAM: RIGHT SHOULDER - 2+ VIEW COMPARISON:  None Available. FINDINGS: Status post right total shoulder arthroplasty. No dislocation is noted. Possible mildly displaced fracture involving inferior portion of right scapula. IMPRESSION: Possible mildly displaced right scapular fracture as noted above. Electronically Signed    By: Rosalene Colon M.D.   On: 11/26/2023 15:44   DG Humerus Right Result Date: 11/26/2023 CLINICAL DATA:  Right arm pain after fall. EXAM: RIGHT HUMERUS - 2+ VIEW COMPARISON:  None Available. FINDINGS: Status post right shoulder arthroplasty. No fracture or dislocation is noted. IMPRESSION: No acute abnormality seen. Electronically Signed   By: Rosalene Colon M.D.   On: 11/26/2023 15:42   DG Chest Portable 1 View Result Date: 11/15/2023 CLINICAL DATA:  Dyspnea EXAM: PORTABLE CHEST 1 VIEW COMPARISON:  11/11/2023 chest radiograph. FINDINGS: Right total shoulder arthroplasty. Stable 2 lead left subclavian pacemaker. Stable cardiomediastinal silhouette with mild cardiomegaly. No pneumothorax. Small bilateral pleural effusions are similar. Similar borderline mild pulmonary edema. Increased hazy bibasilar lung opacities. IMPRESSION: 1. Similar borderline mild pulmonary edema and small bilateral pleural effusions. 2. Increased hazy bibasilar lung opacities, favor atelectasis, difficult to exclude a component of aspiration or pneumonia. Electronically Signed   By: Levell Reach M.D.   On: 11/15/2023 11:17   DG Chest Port 1 View Result Date: 11/11/2023 CLINICAL DATA:  Post thoracentesis EXAM: PORTABLE CHEST 1 VIEW COMPARISON:  11/09/2023, 11/05/2023 FINDINGS: Left-sided pacing device as before. Right shoulder replacement. Cardiomegaly with vascular congestion. Decreased left-sided pleural effusion post thoracentesis. No definitive pneumothorax. Hazy asymmetric opacification of right thorax could relate to layering effusion. Effusion on the right is probably decreased. Aortic atherosclerosis. IMPRESSION: 1. Decreased left-sided pleural effusion post thoracentesis. No definitive pneumothorax. 2. Hazy asymmetric opacification of right thorax could relate to layering effusion. Effusion on the right is probably decreased. 3. Cardiomegaly with vascular congestion Electronically Signed   By: Esmeralda Hedge M.D.   On:  11/11/2023 15:32   US  THORACENTESIS ASP PLEURAL SPACE W/IMG GUIDE Result Date: 11/11/2023 INDICATION: Patient with history of CHF, COPD, and CKD who presented to ED for shortness of breath. Bilateral pleural effusions noted on chest x-ray leading to referral for thoracentesis while inpatient. EXAM: ULTRASOUND GUIDED THERAPEUTIC THORACENTESIS MEDICATIONS: 8 mL 1% lidocaine  COMPLICATIONS: None immediate. PROCEDURE: An ultrasound guided thoracentesis was thoroughly discussed with the patient and questions answered. The benefits, risks, alternatives and complications were also discussed. The patient understands and wishes to proceed with the procedure. Written consent was obtained. Ultrasound was performed to localize and mark an adequate pocket of fluid in the left chest. The area was then prepped and draped in the normal sterile fashion. 1% Lidocaine  was used for local anesthesia. Under ultrasound guidance a 6 Fr Safe-T-Centesis catheter was introduced. Thoracentesis was performed. The catheter was removed and a dressing applied. FINDINGS: A total of approximately 850 mL of yellow fluid was removed. IMPRESSION:  Successful ultrasound guided therapeutic left thoracentesis yielding 850 mL of pleural fluid. Electronically Signed   By: Myrlene Asper D.O.   On: 11/11/2023 14:21   DG Chest 2 View Result Date: 11/09/2023 CLINICAL DATA:  Ongoing shortness of breath and difficulty sleeping. EXAM: CHEST - 2 VIEW COMPARISON:  11/05/2023. FINDINGS: The heart is enlarged and the mediastinal contour is within normal limits. There is atherosclerotic calcification of the aorta. Chronic interstitial prominence is noted bilaterally. There are small bilateral pleural effusions with atelectasis or infiltrate at the lung bases. No pneumothorax is seen. A dual lead pacemaker device is present over the left chest. Right shoulder arthroplasty changes are noted. There is evidence of prior rotator cuff repair at the left shoulder.  IMPRESSION: 1. Atelectasis or infiltrate at the lung bases. 2. Small bilateral pleural effusions. 3. Aortic atherosclerosis. Electronically Signed   By: Wyvonnia Heimlich M.D.   On: 11/09/2023 18:01   DG Chest Port 1 View Result Date: 11/05/2023 CLINICAL DATA:  Questionable sepsis. EXAM: PORTABLE CHEST 1 VIEW COMPARISON:  X-ray 10/22/2023 and older FINDINGS: Enlarged cardiopericardial silhouette with calcified aorta. Left upper chest pacemaker. Moderate left effusion. Tiny right. Vascular calcifications. No pneumothorax. Chronic lung changes. Interval changes with some vascular congestion. Right shoulder reverse arthroplasty seen. Films are under penetrated. Surgical Janae Mclean is along the left humeral head. IMPRESSION: Enlarged cardiopericardial silhouette calcified aorta and vascular shin. Chronic lung changes. Bilateral pleural effusions are seen, moderate left and small right. Adjacent opacity. Recommend follow-up. Electronically Signed   By: Adrianna Horde M.D.   On: 11/05/2023 12:45    Microbiology: Results for orders placed or performed during the hospital encounter of 11/09/23  Resp panel by RT-PCR (RSV, Flu A&B, Covid) Anterior Nasal Swab     Status: None   Collection Time: 11/09/23  3:39 PM   Specimen: Anterior Nasal Swab  Result Value Ref Range Status   SARS Coronavirus 2 by RT PCR NEGATIVE NEGATIVE Final    Comment: (NOTE) SARS-CoV-2 target nucleic acids are NOT DETECTED.  The SARS-CoV-2 RNA is generally detectable in upper respiratory specimens during the acute phase of infection. The lowest concentration of SARS-CoV-2 viral copies this assay can detect is 138 copies/mL. A negative result does not preclude SARS-Cov-2 infection and should not be used as the sole basis for treatment or other patient management decisions. A negative result may occur with  improper specimen collection/handling, submission of specimen other than nasopharyngeal swab, presence of viral mutation(s) within the areas  targeted by this assay, and inadequate number of viral copies(<138 copies/mL). A negative result must be combined with clinical observations, patient history, and epidemiological information. The expected result is Negative.  Fact Sheet for Patients:  BloggerCourse.com  Fact Sheet for Healthcare Providers:  SeriousBroker.it  This test is no t yet approved or cleared by the [place]  FDA and  has been authorized for detection and/or diagnosis of SARS-CoV-2 by FDA under an Emergency Use Authorization (EUA). This EUA will remain  in effect (meaning this test can be used) for the duration of the COVID-19 declaration under Section 564(b)(1) of the Act, 21 U.S.C.section 360bbb-3(b)(1), unless the authorization is terminated  or revoked sooner.       Influenza A by PCR NEGATIVE NEGATIVE Final   Influenza B by PCR NEGATIVE NEGATIVE Final    Comment: (NOTE) The Xpert Xpress SARS-CoV-2/FLU/RSV plus assay is intended as an aid in the diagnosis of influenza from Nasopharyngeal swab specimens and should not be used as a sole basis  for treatment. Nasal washings and aspirates are unacceptable for Xpert Xpress SARS-CoV-2/FLU/RSV testing.  Fact Sheet for Patients: BloggerCourse.com  Fact Sheet for Healthcare Providers: SeriousBroker.it  This test is not yet approved or cleared by the [place]  FDA and has been authorized for detection and/or diagnosis of SARS-CoV-2 by FDA under an Emergency Use Authorization (EUA). This EUA will remain in effect (meaning this test can be used) for the duration of the COVID-19 declaration under Section 564(b)(1) of the Act, 21 U.S.C. section 360bbb-3(b)(1), unless the authorization is terminated or revoked.     Resp Syncytial Virus by PCR NEGATIVE NEGATIVE Final    Comment: (NOTE) Fact Sheet for  Patients: BloggerCourse.com  Fact Sheet for Healthcare Providers: SeriousBroker.it  This test is not yet approved or cleared by the [place]  FDA and has been authorized for detection and/or diagnosis of SARS-CoV-2 by FDA under an Emergency Use Authorization (EUA). This EUA will remain in effect (meaning this test can be used) for the duration of the COVID-19 declaration under Section 564(b)(1) of the Act, 21 U.S.C. section 360bbb-3(b)(1), unless the authorization is terminated or revoked.  Performed at [facility], [address] [place], [place] [address]   Body fluid culture w Gram Stain     Status: None   Collection Time: 11/11/23 11:40 AM   Specimen: PATH Cytology Pleural fluid  Result Value Ref Range Status   Specimen Description   Final    FLUID Performed at [facility], [address] [place], [place] [address]    Special Requests   Final    CYTO PLEU Performed at [facility], [address] [place], [place] [address]    Gram Stain NO WBC SEEN NO ORGANISMS SEEN   Final   Culture   Final    NO GROWTH 3 DAYS Performed at [facility], [address], [place], [place] [address]    Report Status 11/14/2023 FINAL  Final    Labs: CBC: Recent Labs  Lab 11/26/23 1718 11/28/23 0304  WBC 5.8 5.0  HGB 11.3* 10.5*  HCT 34.2* 31.5*  MCV 95.3 94.6  PLT 224 222   Basic Metabolic Panel: Recent Labs  Lab 11/26/23 1718 11/28/23 0304  NA 136 132*  K 3.4* 4.0  CL 100 97*  CO2 25 25  GLUCOSE 138* 149*  BUN 69* 70*  CREATININE 2.60* 2.94*  CALCIUM 8.5* 8.3*   Liver Function Tests: No results for input(s): "AST", "ALT", "ALKPHOS", "BILITOT", "PROT", "ALBUMIN " in the last 168 hours. CBG: No results for input(s): "GLUCAP" in the last 168 hours.  Discharge time spent: greater than 30 minutes.  Signed: Brenna Cam, MD [facility] 11/29/2023

## 2023-11-29 NOTE — Progress Notes (Signed)
 PT Cancellation Note  Patient Details Name: Leslie Parker MRN: [id number] DOB: 1932-07-31   Cancelled Treatment:    Reason Eval/Treat Not Completed: Patient declined, no reason specified;Other (comment) (nausea). Will re-attempt as time allows, patient is discharging to SNF today.   Dayton Sherr 11/29/2023, 9:58 AM

## 2023-11-29 NOTE — TOC Transition Note (Signed)
 Transition of Care [facility]) - Discharge Note   Patient Details  Name: Leslie Parker MRN: [id number] Date of Birth: 11/07/1932  Transition of Care [facility]) CM/SW Contact:  Crayton Docker, RN 11/29/2023, 10:21 AM   Clinical Narrative:     Discharge orders, discharge summary noted and faxed to [facility]. CM call to [facility] regarding BLS transport scheduling for 1300 today. CM spoke to [place]. BLS transport scheduled for 1300 today. RN report number and SNF room number provided to RN [place].   Final next level of care: Skilled Nursing Facility Barriers to Discharge: No Barriers Identified   Patient Goals and CMS Choice   SNF/[facility]    Discharge Placement     SNF          Discharge Plan and Services Additional resources added to the After Visit Summary for     Discharge Planning Services: CM Consult   DME Agency: NA   HH Arranged: NA   Social Drivers of Health (SDOH) Interventions SDOH Screenings   Food Insecurity: No Food Insecurity (11/26/2023)  Housing: Low Risk  (11/26/2023)  Transportation Needs: No Transportation Needs (11/26/2023)  Utilities: Not At Risk (11/26/2023)  Alcohol Screen: Low Risk  (08/20/2023)  Financial Resource Strain: Low Risk  (08/20/2023)  Social Connections: Moderately Integrated (11/26/2023)  Tobacco Use: Low Risk  (11/26/2023)     Readmission Risk Interventions    10/22/2023    2:28 PM 09/16/2023    2:52 PM 08/20/2023    3:45 PM  Readmission Risk Prevention Plan  Transportation Screening Complete Complete Complete  Medication Review Oceanographer) Complete Complete Complete  PCP or Specialist appointment within 3-5 days of discharge  Complete Complete  HRI or Home Care Consult Not Complete Patient refused Complete  HRI or Home Care Consult Pt Refusal Comments No PT recs at this time.    SW Recovery Care/Counseling Consult Complete Complete Complete  Palliative Care Screening Not Applicable Not Applicable Not  Applicable  Skilled Nursing Facility Not Applicable Not Applicable Not Applicable

## 2023-12-02 DIAGNOSIS — S42101D Fracture of unspecified part of scapula, right shoulder, subsequent encounter for fracture with routine healing: Secondary | ICD-10-CM | POA: Diagnosis not present

## 2023-12-05 DIAGNOSIS — S42101D Fracture of unspecified part of scapula, right shoulder, subsequent encounter for fracture with routine healing: Secondary | ICD-10-CM | POA: Diagnosis not present

## 2023-12-05 DIAGNOSIS — I480 Paroxysmal atrial fibrillation: Secondary | ICD-10-CM | POA: Diagnosis not present

## 2023-12-05 DIAGNOSIS — J449 Chronic obstructive pulmonary disease, unspecified: Secondary | ICD-10-CM | POA: Diagnosis not present

## 2023-12-05 DIAGNOSIS — E559 Vitamin D deficiency, unspecified: Secondary | ICD-10-CM | POA: Diagnosis not present

## 2023-12-09 DIAGNOSIS — I5020 Unspecified systolic (congestive) heart failure: Secondary | ICD-10-CM | POA: Diagnosis not present

## 2023-12-09 DIAGNOSIS — N184 Chronic kidney disease, stage 4 (severe): Secondary | ICD-10-CM | POA: Diagnosis not present

## 2023-12-09 DIAGNOSIS — R1013 Epigastric pain: Secondary | ICD-10-CM | POA: Diagnosis not present

## 2023-12-09 DIAGNOSIS — I10 Essential (primary) hypertension: Secondary | ICD-10-CM | POA: Diagnosis not present

## 2023-12-10 DIAGNOSIS — I10 Essential (primary) hypertension: Secondary | ICD-10-CM | POA: Diagnosis not present

## 2023-12-10 DIAGNOSIS — I5032 Chronic diastolic (congestive) heart failure: Secondary | ICD-10-CM | POA: Diagnosis not present

## 2023-12-10 DIAGNOSIS — J189 Pneumonia, unspecified organism: Secondary | ICD-10-CM | POA: Diagnosis not present

## 2023-12-13 DIAGNOSIS — S42101D Fracture of unspecified part of scapula, right shoulder, subsequent encounter for fracture with routine healing: Secondary | ICD-10-CM | POA: Diagnosis not present

## 2023-12-13 DIAGNOSIS — I5032 Chronic diastolic (congestive) heart failure: Secondary | ICD-10-CM | POA: Diagnosis not present

## 2023-12-13 DIAGNOSIS — J189 Pneumonia, unspecified organism: Secondary | ICD-10-CM | POA: Diagnosis not present

## 2023-12-14 ENCOUNTER — Other Ambulatory Visit: Payer: Self-pay

## 2023-12-14 ENCOUNTER — Encounter: Payer: Self-pay | Admitting: Emergency Medicine

## 2023-12-14 ENCOUNTER — Emergency Department

## 2023-12-14 ENCOUNTER — Inpatient Hospital Stay
Admission: EM | Admit: 2023-12-14 | Discharge: 2023-12-16 | DRG: 291 | Disposition: A | Discharge status: Home / Self Care | Attending: Internal Medicine | Admitting: Internal Medicine

## 2023-12-14 DIAGNOSIS — E785 Hyperlipidemia, unspecified: Secondary | ICD-10-CM | POA: Diagnosis present

## 2023-12-14 DIAGNOSIS — J9811 Atelectasis: Secondary | ICD-10-CM | POA: Diagnosis not present

## 2023-12-14 DIAGNOSIS — Z8249 Family history of ischemic heart disease and other diseases of the circulatory system: Secondary | ICD-10-CM | POA: Diagnosis not present

## 2023-12-14 DIAGNOSIS — R0689 Other abnormalities of breathing: Secondary | ICD-10-CM | POA: Diagnosis not present

## 2023-12-14 DIAGNOSIS — N184 Chronic kidney disease, stage 4 (severe): Secondary | ICD-10-CM | POA: Diagnosis not present

## 2023-12-14 DIAGNOSIS — Z7951 Long term (current) use of inhaled steroids: Secondary | ICD-10-CM

## 2023-12-14 DIAGNOSIS — Z9841 Cataract extraction status, right eye: Secondary | ICD-10-CM | POA: Diagnosis not present

## 2023-12-14 DIAGNOSIS — N1832 Chronic kidney disease, stage 3b: Secondary | ICD-10-CM | POA: Diagnosis present

## 2023-12-14 DIAGNOSIS — I447 Left bundle-branch block, unspecified: Secondary | ICD-10-CM | POA: Diagnosis not present

## 2023-12-14 DIAGNOSIS — E1122 Type 2 diabetes mellitus with diabetic chronic kidney disease: Secondary | ICD-10-CM | POA: Diagnosis not present

## 2023-12-14 DIAGNOSIS — J4489 Other specified chronic obstructive pulmonary disease: Secondary | ICD-10-CM | POA: Diagnosis not present

## 2023-12-14 DIAGNOSIS — I130 Hypertensive heart and chronic kidney disease with heart failure and stage 1 through stage 4 chronic kidney disease, or unspecified chronic kidney disease: Principal | ICD-10-CM | POA: Diagnosis present

## 2023-12-14 DIAGNOSIS — I480 Paroxysmal atrial fibrillation: Secondary | ICD-10-CM | POA: Diagnosis present

## 2023-12-14 DIAGNOSIS — R069 Unspecified abnormalities of breathing: Secondary | ICD-10-CM | POA: Diagnosis not present

## 2023-12-14 DIAGNOSIS — E1129 Type 2 diabetes mellitus with other diabetic kidney complication: Secondary | ICD-10-CM | POA: Diagnosis present

## 2023-12-14 DIAGNOSIS — Z96611 Presence of right artificial shoulder joint: Secondary | ICD-10-CM | POA: Diagnosis not present

## 2023-12-14 DIAGNOSIS — I509 Heart failure, unspecified: Principal | ICD-10-CM

## 2023-12-14 DIAGNOSIS — Z96653 Presence of artificial knee joint, bilateral: Secondary | ICD-10-CM | POA: Diagnosis present

## 2023-12-14 DIAGNOSIS — Z825 Family history of asthma and other chronic lower respiratory diseases: Secondary | ICD-10-CM

## 2023-12-14 DIAGNOSIS — Z9842 Cataract extraction status, left eye: Secondary | ICD-10-CM

## 2023-12-14 DIAGNOSIS — Z961 Presence of intraocular lens: Secondary | ICD-10-CM | POA: Diagnosis present

## 2023-12-14 DIAGNOSIS — Z950 Presence of cardiac pacemaker: Secondary | ICD-10-CM

## 2023-12-14 DIAGNOSIS — Z888 Allergy status to other drugs, medicaments and biological substances status: Secondary | ICD-10-CM | POA: Diagnosis not present

## 2023-12-14 DIAGNOSIS — I5023 Acute on chronic systolic (congestive) heart failure: Secondary | ICD-10-CM | POA: Diagnosis present

## 2023-12-14 DIAGNOSIS — Z7901 Long term (current) use of anticoagulants: Secondary | ICD-10-CM | POA: Diagnosis not present

## 2023-12-14 DIAGNOSIS — Z79899 Other long term (current) drug therapy: Secondary | ICD-10-CM | POA: Diagnosis not present

## 2023-12-14 DIAGNOSIS — Z66 Do not resuscitate: Secondary | ICD-10-CM | POA: Diagnosis present

## 2023-12-14 DIAGNOSIS — Z1152 Encounter for screening for COVID-19: Secondary | ICD-10-CM | POA: Diagnosis not present

## 2023-12-14 DIAGNOSIS — I110 Hypertensive heart disease with heart failure: Secondary | ICD-10-CM | POA: Diagnosis not present

## 2023-12-14 DIAGNOSIS — Z96642 Presence of left artificial hip joint: Secondary | ICD-10-CM | POA: Diagnosis present

## 2023-12-14 DIAGNOSIS — I495 Sick sinus syndrome: Secondary | ICD-10-CM | POA: Diagnosis not present

## 2023-12-14 DIAGNOSIS — J811 Chronic pulmonary edema: Secondary | ICD-10-CM | POA: Diagnosis not present

## 2023-12-14 DIAGNOSIS — I10 Essential (primary) hypertension: Secondary | ICD-10-CM | POA: Diagnosis not present

## 2023-12-14 LAB — COMPREHENSIVE METABOLIC PANEL WITH GFR
ALT: 20 U/L (ref 0–44)
AST: 29 U/L (ref 15–41)
Albumin: 3.3 g/dL — ABNORMAL LOW (ref 3.5–5.0)
Alkaline Phosphatase: 106 U/L (ref 38–126)
Anion gap: 12 (ref 5–15)
BUN: 47 mg/dL — ABNORMAL HIGH (ref 8–23)
CO2: 18 mmol/L — ABNORMAL LOW (ref 22–32)
Calcium: 8.6 mg/dL — ABNORMAL LOW (ref 8.9–10.3)
Chloride: 105 mmol/L (ref 98–111)
Creatinine, Ser: 1.9 mg/dL — ABNORMAL HIGH (ref 0.44–1.00)
GFR, Estimated: 25 mL/min — ABNORMAL LOW (ref >60)
Glucose, Bld: 147 mg/dL — ABNORMAL HIGH (ref 70–99)
Potassium: 4.3 mmol/L (ref 3.5–5.1)
Sodium: 135 mmol/L (ref 135–145)
Total Bilirubin: 1.1 mg/dL (ref 0.0–1.2)
Total Protein: 6.3 g/dL — ABNORMAL LOW (ref 6.5–8.1)

## 2023-12-14 LAB — CBC WITH DIFFERENTIAL/PLATELET
Abs Immature Granulocytes: 0.01 10*3/uL (ref 0.00–0.07)
Basophils Absolute: 0.1 10*3/uL (ref 0.0–0.1)
Basophils Relative: 1 %
Eosinophils Absolute: 0 10*3/uL (ref 0.0–0.5)
Eosinophils Relative: 0 %
HCT: 36.1 % (ref 36.0–46.0)
Hemoglobin: 12.1 g/dL (ref 12.0–15.0)
Immature Granulocytes: 0 %
Lymphocytes Relative: 24 %
Lymphs Abs: 1.4 10*3/uL (ref 0.7–4.0)
MCH: 31.6 pg (ref 26.0–34.0)
MCHC: 33.5 g/dL (ref 30.0–36.0)
MCV: 94.3 fL (ref 80.0–100.0)
Monocytes Absolute: 0.3 10*3/uL (ref 0.1–1.0)
Monocytes Relative: 6 %
Neutro Abs: 4 10*3/uL (ref 1.7–7.7)
Neutrophils Relative %: 69 %
Platelets: 271 10*3/uL (ref 150–400)
RBC: 3.83 MIL/uL — ABNORMAL LOW (ref 3.87–5.11)
RDW: 17.3 % — ABNORMAL HIGH (ref 11.5–15.5)
WBC: 5.8 10*3/uL (ref 4.0–10.5)
nRBC: 0 % (ref 0.0–0.2)

## 2023-12-14 LAB — BLOOD GAS, VENOUS
Acid-base deficit: 1.2 mmol/L (ref 0.0–2.0)
Bicarbonate: 22.6 mmol/L (ref 20.0–28.0)
O2 Saturation: 92 %
Patient temperature: 37
pCO2, Ven: 34 mmHg — ABNORMAL LOW (ref 44–60)
pH, Ven: 7.43 (ref 7.25–7.43)
pO2, Ven: 60 mmHg — ABNORMAL HIGH (ref 32–45)

## 2023-12-14 LAB — TROPONIN I (HIGH SENSITIVITY): Troponin I (High Sensitivity): 50 ng/L — ABNORMAL HIGH (ref <18)

## 2023-12-14 LAB — RESP PANEL BY RT-PCR (RSV, FLU A&B, COVID)  RVPGX2
Influenza A by PCR: NEGATIVE
Influenza B by PCR: NEGATIVE
Resp Syncytial Virus by PCR: NEGATIVE
SARS Coronavirus 2 by RT PCR: NEGATIVE

## 2023-12-14 LAB — BRAIN NATRIURETIC PEPTIDE: B Natriuretic Peptide: >4500 pg/mL — ABNORMAL HIGH (ref 0.0–100.0)

## 2023-12-14 MED ORDER — ACETAMINOPHEN 650 MG RE SUPP: 650.0000 mg | Freq: Four times a day (QID) | RECTAL | Status: DC | PRN

## 2023-12-14 MED ORDER — VITAMIN D 25 MCG (1000 UNIT) PO TABS
1000.0000 [IU] | ORAL_TABLET | Freq: Every day | ORAL | Status: DC
  Administered 2023-12-14 – 2023-12-16 (×3): 1000 [IU] via ORAL
  Filled 2023-12-14 (×3): qty 1

## 2023-12-14 MED ORDER — IPRATROPIUM-ALBUTEROL 0.5-2.5 (3) MG/3ML IN SOLN: 3.0000 mL | Freq: Four times a day (QID) | RESPIRATORY_TRACT | Status: DC | PRN

## 2023-12-14 MED ORDER — ACETAMINOPHEN 325 MG PO TABS: 650.0000 mg | ORAL_TABLET | Freq: Four times a day (QID) | ORAL | Status: DC | PRN

## 2023-12-14 MED ORDER — DOCUSATE SODIUM 100 MG PO CAPS
100.0000 mg | ORAL_CAPSULE | Freq: Two times a day (BID) | ORAL | Status: DC
  Administered 2023-12-14 – 2023-12-16 (×4): 100 mg via ORAL
  Filled 2023-12-14 (×4): qty 1

## 2023-12-14 MED ORDER — VITAMIN B-12 1000 MCG PO TABS
2500.0000 ug | ORAL_TABLET | Freq: Every day | ORAL | Status: DC
  Administered 2023-12-14 – 2023-12-16 (×3): 2500 ug via ORAL
  Filled 2023-12-14 (×3): qty 3
  Filled 2023-12-14: qty 2.5

## 2023-12-14 MED ORDER — POLYETHYLENE GLYCOL 3350 17 G PO PACK: 17.0000 g | PACK | Freq: Every day | ORAL | Status: DC | PRN

## 2023-12-14 MED ORDER — AMIODARONE HCL 200 MG PO TABS
200.0000 mg | ORAL_TABLET | Freq: Every day | ORAL | Status: DC
  Administered 2023-12-14 – 2023-12-16 (×3): 200 mg via ORAL
  Filled 2023-12-14 (×3): qty 1

## 2023-12-14 MED ORDER — ONDANSETRON HCL 4 MG PO TABS: 4.0000 mg | ORAL_TABLET | Freq: Four times a day (QID) | ORAL | Status: DC | PRN

## 2023-12-14 MED ORDER — FUROSEMIDE 10 MG/ML IJ SOLN
80.0000 mg | Freq: Once | INTRAMUSCULAR | Status: AC
  Administered 2023-12-14: 80 mg via INTRAVENOUS
  Filled 2023-12-14: qty 8

## 2023-12-14 MED ORDER — ONDANSETRON HCL 4 MG/2ML IJ SOLN: 4.0000 mg | Freq: Four times a day (QID) | INTRAMUSCULAR | Status: DC | PRN

## 2023-12-14 MED ORDER — ADULT MULTIVITAMIN W/MINERALS CH
1.0000 | ORAL_TABLET | Freq: Every day | ORAL | Status: DC
  Administered 2023-12-14 – 2023-12-16 (×3): 1 via ORAL
  Filled 2023-12-14 (×3): qty 1

## 2023-12-14 MED ORDER — BUDESON-GLYCOPYRROL-FORMOTEROL 160-9-4.8 MCG/ACT IN AERO
2.0000 | INHALATION_SPRAY | Freq: Two times a day (BID) | RESPIRATORY_TRACT | Status: DC
  Administered 2023-12-14 – 2023-12-16 (×4): 2 via RESPIRATORY_TRACT
  Filled 2023-12-14: qty 5.9

## 2023-12-14 MED ORDER — HYDRALAZINE HCL 20 MG/ML IJ SOLN
5.0000 mg | Freq: Four times a day (QID) | INTRAMUSCULAR | Status: DC | PRN
  Administered 2023-12-16: 5 mg via INTRAVENOUS
  Filled 2023-12-14: qty 1

## 2023-12-14 MED ORDER — ALBUTEROL SULFATE (2.5 MG/3ML) 0.083% IN NEBU: 2.5000 mg | INHALATION_SOLUTION | RESPIRATORY_TRACT | Status: DC | PRN

## 2023-12-14 MED ORDER — APIXABAN 2.5 MG PO TABS
2.5000 mg | ORAL_TABLET | Freq: Two times a day (BID) | ORAL | Status: DC
  Administered 2023-12-14 – 2023-12-16 (×5): 2.5 mg via ORAL
  Filled 2023-12-14 (×6): qty 1

## 2023-12-14 MED ORDER — FUROSEMIDE 10 MG/ML IJ SOLN
40.0000 mg | Freq: Two times a day (BID) | INTRAMUSCULAR | Status: DC
  Administered 2023-12-14 – 2023-12-15 (×2): 40 mg via INTRAVENOUS
  Filled 2023-12-14 (×2): qty 4

## 2023-12-14 MED ORDER — VITAMIN C 500 MG PO TABS
250.0000 mg | ORAL_TABLET | Freq: Every day | ORAL | Status: DC
  Administered 2023-12-14 – 2023-12-16 (×3): 250 mg via ORAL
  Filled 2023-12-14 (×3): qty 1

## 2023-12-14 MED ORDER — OXYCODONE HCL 5 MG PO TABS
2.5000 mg | ORAL_TABLET | Freq: Four times a day (QID) | ORAL | Status: DC | PRN
  Administered 2023-12-14 – 2023-12-15 (×2): 2.5 mg via ORAL
  Filled 2023-12-14 (×2): qty 1

## 2023-12-14 NOTE — TOC CM/SW Note (Signed)
 Per chart review - [facility] is from [facility]. Will need PT evals and new auth if continued [facility] is needed.  [name], LCSW Transitions of Care Department [phone]

## 2023-12-14 NOTE — ED Provider Notes (Signed)
 [facility] Provider Note    Event Date/Time   First MD Initiated Contact with Patient 12/14/23 [phone]     (approximate)   History   Shortness of Breath   HPI  Leslie Parker is a 89 y.o. female with acute on chronic respiratory failure not on oxygen who comes in with concerns for shortness of breath.  Patient reports that she was told she has a little bit of oxygen on her lungs.  She states that she has been taking Lasix  but is been having increasing shortness of breath when EMS got there her oxygen levels were in the low 90s but she had increased work of breathing to left on 2 L for comfort.  She denies any chest pain, abdominal pain, falls or hitting her head or any other concerns.   Physical Exam   Triage Vital Signs: ED Triage Vitals  Encounter Vitals Group     BP      Systolic BP Percentile      Diastolic BP Percentile      Pulse      Resp      Temp      Temp src      SpO2      Weight      Height      Head Circumference      [facility]      Pain Score      Pain Loc      Pain Education      Exclude from Growth Chart     Most recent vital signs: Vitals:   12/14/23 0900  BP: (!) 183/92  Pulse: 84  Resp: (!) 21  SpO2: 99%     General: Awake, no distress.  CV:  Good peripheral perfusion.  Resp:  Normal effort.  No wheezing Abd:  No distention.  Other:  No swelling in legs.  No calf tenderness   ED Results / Procedures / Treatments   Labs (all labs ordered are listed, but only abnormal results are displayed) Labs Reviewed  CBC WITH DIFFERENTIAL/PLATELET - Abnormal; Notable for the following components:      Result Value   RBC 3.83 (*)    RDW 17.3 (*)    All other components within normal limits  COMPREHENSIVE METABOLIC PANEL WITH GFR - Abnormal; Notable for the following components:   CO2 18 (*)    Glucose, Bld 147 (*)    BUN 47 (*)    Creatinine, Ser 1.90 (*)    Calcium 8.6 (*)    Total Protein 6.3 (*)    Albumin  3.3  (*)    GFR, Estimated 25 (*)    All other components within normal limits  BRAIN NATRIURETIC PEPTIDE - Abnormal; Notable for the following components:   B Natriuretic Peptide >4,500.0 (*)    All other components within normal limits  BLOOD GAS, VENOUS - Abnormal; Notable for the following components:   pCO2, Ven 34 (*)    pO2, Ven 60 (*)    All other components within normal limits  TROPONIN I (HIGH SENSITIVITY) - Abnormal; Notable for the following components:   Troponin I (High Sensitivity) 50 (*)    All other components within normal limits  RESP PANEL BY RT-PCR (RSV, FLU A&B, COVID)  [id number]     EKG  My interpretation of EKG:  Normal sinus rate of 78 without any ST elevation, slight T wave inversion in V6 with left bundle branch block'  LBBB similar  to prior   RADIOLOGY I have reviewed the xray personally and interpreted positive edema   PROCEDURES:  Critical Care performed: Yes, see critical care procedure note(s)  .1-3 Lead EKG Interpretation  Performed by: Lubertha Rush, MD Authorized by: Lubertha Rush, MD     Interpretation: normal     ECG rate:  80   ECG rate assessment: normal     Rhythm: sinus rhythm     Ectopy: none     Conduction: normal      MEDICATIONS ORDERED IN ED: Medications  furosemide  (LASIX ) injection 80 mg (80 mg Intravenous Given 12/14/23 0937)     IMPRESSION / MDM / ASSESSMENT AND PLAN / ED COURSE  I reviewed the triage vital signs and the nursing notes.   Patient's presentation is most consistent with severe exacerbation of chronic illness.   Patient comes in with increased work of breathing with concerns for CHF no wheezing to suggest COPD.  She is on Eliquis  doubt PE.  VBG with no evidence of hypercapnia BNP is significantly elevated similar to prior.  CBC shows stable hemoglobin.  CMP shows creatinine that is lower than baseline slightly low bicarb.  Troponin is stable and flat.  X-ray is consistent with some edema.  Given her  work of breathing we will discuss possible team for admission and will give a dose of IV Lasix .  The patient is on the cardiac monitor to evaluate for evidence of arrhythmia and/or significant heart rate changes.      FINAL CLINICAL IMPRESSION(S) / ED DIAGNOSES   Final diagnoses:  Acute on chronic congestive heart failure, unspecified heart failure type (HCC)     Rx / DC Orders   ED Discharge Orders     None        Note:  This document was prepared using Dragon voice recognition software and may include unintentional dictation errors.   Lubertha Rush, MD 12/14/23 1004

## 2023-12-14 NOTE — H&P (Signed)
 [facility] H&P   Patient Demographics:    Leslie Parker, is a 89 y.o. female  MRN: [id number]   DOB - 1933/03/12  Admit Date - 12/14/2023  Outpatient Primary MD for the patient is Rex Castor, MD  Referring MD/NP/PA: Dr. Author Board  Outpatient Specialists: [facility] cardiology  Patient coming from: Home  Chief Complaint  Patient presents with   Shortness of Breath      HPI:    Leslie Parker  is a 89 y.o. female, with medical history significant of chronic HFrEF with last EF of 25-30%, SSS s/p PPM, atrial fibrillation on Eliquis , COPD, hypertension, hyperlipidemia, CKD stage IV, osteopenia, patient with multiple admissions in the past, actually this is her 11th admission just this year within less than 72-month.   - Patient was discharged recently with a scapular fracture, and as well she was noted to have worsening renal function, so her diuresis has been held and discharged with recommendation to repeat labs in 3 days and resume once normalized (unfortunately it does appear patient was able to follow with her PCP regarding repeat labs, so she was not back on her diuretics) . - Patient comes to ED secondary to shortness of breath, patient reports she has progressive dyspnea, mainly upon laying supine, denies chest pain, fever or chills, no falls or loss of consciousness -In ED her labs significant for creatinine 1.9 (down from 2.9), BNP significantly elevated at more 4500, she had no leukocytosis, chest x-ray significant for volume overload, so [facility] hospitalist requested to admit for volume overload.    Review of systems:      A full 10 point Review of Systems was done, except as stated above, all other Review of Systems were negative.   With Past History of the following :    Past Medical History:  Diagnosis Date   Acute kidney injury superimposed on CKD (HCC) 11/24/2022   Asthma     Atrial fibrillation with RVR (HCC)    Cancer (HCC)    Basal Cell   CHF (congestive heart failure) (HCC)    Closed left hip fracture (HCC) 01/25/2017   Closed right hip fracture (HCC) 02/13/2023   Diabetes mellitus without complication (HCC)    Femur fracture, left (HCC) 02/03/2015   Heart murmur    Hypertension    Impetigo    Osteoarthritis    Osteopenia    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   Rapid atrial fibrillation, new onset(HCC) 06/05/2022   Vomiting    can not due to surgery   Wears dentures    full upper and lower      Past Surgical History:  Procedure Laterality Date   ABDOMINAL HYSTERECTOMY     BACK SURGERY     BLADDER SURGERY     mesh   CARPAL TUNNEL RELEASE Bilateral    CATARACT EXTRACTION Right 2017   CATARACT EXTRACTION W/PHACO Left 02/06/2016  Procedure: CATARACT EXTRACTION PHACO AND INTRAOCULAR LENS PLACEMENT (IOC) left eye;  Surgeon: Billee Buddle, MD;  Location: [facility];  Service: Ophthalmology;  Laterality: Left;  DIABETIC LEFT Cannot arrive before 9:30   CERVICAL DISC SURGERY     CHOLECYSTECTOMY     EYE SURGERY Bilateral    Cataract Extraction with IOL   FEMUR IM NAIL Left 02/04/2015   Procedure: INTRAMEDULLARY (IM) NAIL FEMORAL;  Surgeon: Rande Bushy, MD;  Location: [facility];  Service: Orthopedics;  Laterality: Left;   HARDWARE REMOVAL Left 01/17/2017   Procedure: HARDWARE REMOVAL;  Surgeon: Rande Bushy, MD;  Location: [facility];  Service: Orthopedics;  Laterality: Left;   HERNIA REPAIR  2014   esophageal and gastric mesh. patient unable to throw up d/t mesh   HIP ARTHROPLASTY Left 01/26/2017   Procedure: ARTHROPLASTY BIPOLAR HIP (HEMIARTHROPLASTY) removal hardware left hip;  Surgeon: Marlynn Singer, MD;  Location: [facility];  Service: Orthopedics;  Laterality: Left;   INTRAMEDULLARY (IM) NAIL INTERTROCHANTERIC Right 02/13/2023   Procedure: INTRAMEDULLARY (IM) NAIL INTERTROCHANTERIC;  Surgeon: Elner Hahn, MD;  Location:  [facility];  Service: Orthopedics;  Laterality: Right;   JOINT REPLACEMENT Bilateral    knees   PACEMAKER IMPLANT N/A 11/15/2022   Procedure: PACEMAKER IMPLANT;  Surgeon: Percival Brace, MD;  Location: [facility];  Service: Cardiovascular;  Laterality: N/A;   PACEMAKER IMPLANT N/A 11/28/2022   Procedure: PACEMAKER IMPLANT;  Surgeon: Percival Brace, MD;  Location: [facility];  Service: Cardiovascular;  Laterality: N/A;  Lead reposition   REPLACEMENT TOTAL KNEE BILATERAL Bilateral [id number]   SHOULDER ARTHROSCOPY WITH OPEN ROTATOR CUFF REPAIR AND DISTAL CLAVICLE ACROMINECTOMY Left 10/25/2016   Procedure: SHOULDER ARTHROSCOPY WITH OPEN ROTATOR CUFF REPAIR AND DISTAL CLAVICLE ACROMINECTOMY;  Surgeon: Rande Bushy, MD;  Location: [facility];  Service: Orthopedics;  Laterality: Left;   THYROID  SURGERY     goiter removed   TOTAL HIP REVISION Left 12/02/2017   Procedure: TOTAL HIP REVISION;  Surgeon: Jerlyn Moons, MD;  Location: [facility];  Service: Orthopedics;  Laterality: Left;   TOTAL SHOULDER REPLACEMENT Right 2012      Social History:     Social History   Tobacco Use   Smoking status: Never   Smokeless tobacco: Never  Substance Use Topics   Alcohol use: No    Alcohol/week: 0.0 standard drinks of alcohol       Family History :     Family History  Problem Relation Age of Onset   Heart failure Mother    Hypertension Mother    Emphysema Father     Home Medications:   Prior to Admission medications   Medication Sig Start Date End Date Taking? Authorizing Provider  acetaminophen  (TYLENOL ) 500 MG tablet Take 2 tablets (1,000 mg total) by mouth 3 (three) times daily. 11/29/23   Brenna Cam, MD  albuterol  (VENTOLIN  HFA) 108 (90 Base) MCG/ACT inhaler Inhale 2 puffs into the lungs every 6 (six) hours as needed. 08/04/19   [provider]  alum & mag hydroxide-simeth (MAALOX/MYLANTA) 200-200-20 MG/5ML suspension Take 30 mLs by mouth every 6 (six)  hours as needed for indigestion or heartburn. 11/29/23   Brenna Cam, MD  amiodarone  (PACERONE ) 200 MG tablet Take 1 tablet by mouth daily. 08/26/23   [provider]  apixaban  (ELIQUIS ) 2.5 MG TABS tablet Take 1 tablet (2.5 mg total) by mouth 2 (two) times daily. 06/07/22   Tiajuana Fluke, MD  ascorbic acid  (VITAMIN C ) 250 MG  CHEW Chew 250 mg by mouth daily.    [provider]  Cholecalciferol  (VITAMIN D -1000 MAX ST) 25 MCG (1000 UT) tablet Take 1,000 Units by mouth daily.    [provider]  Cyanocobalamin  (B-12) 2500 MCG TABS Take 2,500 mcg by mouth daily.    [provider]  ipratropium-albuterol  (DUONEB) 0.5-2.5 (3) MG/3ML SOLN Inhale 3 mLs into the lungs every 6 (six) hours as needed. 06/01/22   [provider]  Multiple Vitamin (MULTIVITAMIN WITH MINERALS) TABS tablet Take 1 tablet by mouth daily. One-A-Day Women's Vitamin    [provider]  ondansetron  (ZOFRAN ) 4 MG tablet Take 1 tablet (4 mg total) by mouth every 6 (six) hours as needed for nausea. 11/17/23   Verla Glaze, MD  polyethylene glycol (MIRALAX  / GLYCOLAX ) 17 g packet Take 17 g by mouth daily as needed. 11/29/23   Brenna Cam, MD  senna-docusate (SENOKOT-S) 8.6-50 MG tablet Take 2 tablets by mouth at bedtime as needed for mild constipation. 11/29/23   Brenna Cam, MD  TRELEGY ELLIPTA 100-62.5-25 MCG/ACT AEPB Inhale 1 puff into the lungs daily. 08/03/22   [provider]     Allergies:     Allergies  Allergen Reactions   Baclofen Other (See Comments)    Elevated Cr   Simvastatin Other (See Comments)    Pt denies  Elevated LFTs with simva 40mg , stopped and resolved (see MD note 11/10/13)     Physical Exam:   Vitals  Blood pressure (!) 167/79, pulse 76, temperature 98.2 F (36.8 C), temperature source Oral, resp. rate (!) 22, SpO2 100%.   1. General Frail elderly female, laying in bed, in mild dyspnea  2. Normal affect and insight, Not Suicidal or  Homicidal, Awake Alert, Oriented X 3.  3. No F.N deficits, ALL C.Nerves Intact, Strength 5/5 all 4 extremities, Sensation intact all 4 extremities, Plantars down going.  4. Ears and Eyes appear Normal, Conjunctivae clear, PERRLA. Moist Oral Mucosa.  5. Supple Neck, No JVD, No cervical lymphadenopathy appriciated, No Carotid Bruits.  6. Symmetrical Chest wall movement, crackles at the bases  7. RRR, No Gallops, Rubs or Murmurs, No Parasternal Heave.  +1 edema  8. Positive Bowel Sounds, Abdomen Soft, No tenderness, No organomegaly appriciated,No rebound -guarding or rigidity.  9.  No Cyanosis, Normal Skin Turgor, No Skin Rash or Bruise.  10. Good muscle tone,  joints appear normal , no effusions, Normal ROM.     Data Review:    CBC Recent Labs  Lab 12/14/23 0827  WBC 5.8  HGB 12.1  HCT 36.1  PLT 271  MCV 94.3  MCH 31.6  MCHC 33.5  RDW 17.3*  LYMPHSABS 1.4  MONOABS 0.3  EOSABS 0.0  BASOSABS 0.1   ------------------------------------------------------------------------------------------------------------------  Chemistries  Recent Labs  Lab 12/14/23 0827  NA 135  K 4.3  CL 105  CO2 18*  GLUCOSE 147*  BUN 47*  CREATININE 1.90*  CALCIUM 8.6*  AST 29  ALT 20  ALKPHOS 106  BILITOT 1.1   ------------------------------------------------------------------------------------------------------------------ CrCl cannot be calculated (Unknown ideal weight.). ------------------------------------------------------------------------------------------------------------------ No results for input(s): "TSH", "T4TOTAL", "T3FREE", "THYROIDAB" in the last 72 hours.  Invalid input(s): "FREET3"  Coagulation profile No results for input(s): "INR", "PROTIME" in the last 168 hours. ------------------------------------------------------------------------------------------------------------------- No results for input(s): "DDIMER" in the last 72  hours. -------------------------------------------------------------------------------------------------------------------  Cardiac Enzymes No results for input(s): "CKMB", "TROPONINI", "MYOGLOBIN" in the last 168 hours.  Invalid input(s): "CK" ------------------------------------------------------------------------------------------------------------------    Component Value Date/Time  BNP >4,500.0 (H) 12/14/2023 0829     ---------------------------------------------------------------------------------------------------------------  Urinalysis    Component Value Date/Time   COLORURINE YELLOW (A) 11/05/2023 1505   APPEARANCEUR CLEAR (A) 11/05/2023 1505   APPEARANCEUR Clear 02/19/2013 0254   LABSPEC 1.012 11/05/2023 1505   LABSPEC 1.006 02/19/2013 0254   PHURINE 6.0 11/05/2023 1505   GLUCOSEU NEGATIVE 11/05/2023 1505   GLUCOSEU Negative 02/19/2013 0254   HGBUR NEGATIVE 11/05/2023 1505   BILIRUBINUR NEGATIVE 11/05/2023 1505   BILIRUBINUR Negative 02/19/2013 0254   KETONESUR NEGATIVE 11/05/2023 1505   PROTEINUR 100 (A) 11/05/2023 1505   NITRITE NEGATIVE 11/05/2023 1505   LEUKOCYTESUR NEGATIVE 11/05/2023 1505   LEUKOCYTESUR 1+ 02/19/2013 0254    ----------------------------------------------------------------------------------------------------------------   Imaging Results:    DG Chest Portable 1 View Result Date: 12/14/2023 CLINICAL DATA:  Shortness of breath. EXAM: PORTABLE CHEST 1 VIEW COMPARISON:  11/26/2023 FINDINGS: Previous right shoulder arthroplasty. Left chest wall pacer with leads in the right atrial appendage and right ventricle. The aortic atherosclerotic calcifications. Stable cardiomediastinal contours. Small bilateral pleural effusions and mild interstitial edema. Atelectasis noted within both lung bases. IMPRESSION: 1. Mild congestive heart failure with bibasilar atelectasis. 2.  Aortic Atherosclerosis (ICD10-I70.0). Electronically Signed   By: Kimberley Penman  M.D.   On: 12/14/2023 09:30     Assessment & Plan:    Principal Problem:   Acute on chronic systolic CHF (congestive heart failure) (HCC) Active Problems:   Sick sinus syndrome (HCC)   Stage 3b chronic kidney disease (HCC) - Baseline scr 1.8-2.0   Type II diabetes mellitus with renal manifestations (HCC)      Acute on chronic systolic CHF - Patient has a history of HFrEF with last EF of 25-30%.   - She is with evidence of significant volume overload, lower extremity edema, volume overload on imaging and elevated BNP, this is most likely in the setting of holding her Lasix  during recent discharge as she was euvolemic at time of discharge with impaired renal function  - Will resume back on IV Lasix  as he is significantly volume overloaded, and her renal failure likely related to cardiorenal syndrome.. - Continue with daily weight, strict ins and outs - Will start on IV Lasix  40 mg IV twice daily, monitor renal function closely - She does not appear to be on any GDMT, this is most likely in the setting of her renal function  CKD (chronic kidney disease) stage 4 - Likely some underlying cardiorenal syndrome -Her creatinine better than baseline, most likely in setting of volume overload -monitor closely on IV diuresis  Paroxysmal A-fib - Continue with amiodarone  -continue with Eliquis  for anticoagulation   Type II diabetes mellitus with renal manifestations (HCC) Diet controlled, most recent A1c 7 history of diet-controlled type 2 diabetes with last A1c of 7.0%.    COPD (chronic obstructive pulmonary disease) (HCC) She is with some dyspnea, but this is related to volume overload, no wheezing, continue with home medications       DVT Prophylaxis Eliquis   AM Labs Ordered, also please review Full Orders  Family Communication: Admission, patients condition and plan of care including tests being ordered have been discussed with the patient  who indicate understanding and agree  with the plan and Code Status.  Code Status DNR  Likely DC to back to independent living facility  Consults called: None  Admission status: Inpatient  Time spent in minutes : 70 minutes   Seena Dadds M.D on 12/14/2023 at 10:50 AM   [facility] -  Office  [phone]

## 2023-12-14 NOTE — ED Triage Notes (Signed)
 Pt BIB EMS from [facility] for SOB with O2 sat in low 90's.  Pt arrives with [place] 2L.

## 2023-12-15 DIAGNOSIS — I5023 Acute on chronic systolic (congestive) heart failure: Secondary | ICD-10-CM | POA: Diagnosis not present

## 2023-12-15 LAB — CBC
HCT: 33.8 % — ABNORMAL LOW (ref 36.0–46.0)
Hemoglobin: 11.3 g/dL — ABNORMAL LOW (ref 12.0–15.0)
MCH: 31.7 pg (ref 26.0–34.0)
MCHC: 33.4 g/dL (ref 30.0–36.0)
MCV: 94.7 fL (ref 80.0–100.0)
Platelets: 242 10*3/uL (ref 150–400)
RBC: 3.57 MIL/uL — ABNORMAL LOW (ref 3.87–5.11)
RDW: 17.1 % — ABNORMAL HIGH (ref 11.5–15.5)
WBC: 5.1 10*3/uL (ref 4.0–10.5)
nRBC: 0 % (ref 0.0–0.2)

## 2023-12-15 LAB — BASIC METABOLIC PANEL WITH GFR
Anion gap: 9 (ref 5–15)
BUN: 48 mg/dL — ABNORMAL HIGH (ref 8–23)
CO2: 26 mmol/L (ref 22–32)
Calcium: 8.8 mg/dL — ABNORMAL LOW (ref 8.9–10.3)
Chloride: 106 mmol/L (ref 98–111)
Creatinine, Ser: 2.02 mg/dL — ABNORMAL HIGH (ref 0.44–1.00)
GFR, Estimated: 23 mL/min — ABNORMAL LOW (ref >60)
Glucose, Bld: 100 mg/dL — ABNORMAL HIGH (ref 70–99)
Potassium: 4.7 mmol/L (ref 3.5–5.1)
Sodium: 141 mmol/L (ref 135–145)

## 2023-12-15 MED ORDER — FUROSEMIDE 10 MG/ML IJ SOLN
20.0000 mg | Freq: Two times a day (BID) | INTRAMUSCULAR | Status: AC
  Administered 2023-12-15: 20 mg via INTRAVENOUS
  Filled 2023-12-15: qty 2

## 2023-12-15 MED ORDER — LISINOPRIL 5 MG PO TABS
5.0000 mg | ORAL_TABLET | Freq: Every day | ORAL | Status: DC
  Administered 2023-12-15 – 2023-12-16 (×2): 5 mg via ORAL
  Filled 2023-12-15 (×2): qty 1

## 2023-12-15 MED ORDER — ENSURE ENLIVE PO LIQD: 237.0000 mL | Freq: Two times a day (BID) | ORAL | Status: DC

## 2023-12-15 MED ORDER — SENNOSIDES-DOCUSATE SODIUM 8.6-50 MG PO TABS: 2.0000 | ORAL_TABLET | Freq: Every evening | ORAL | Status: DC | PRN
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
## 2023-12-15 NOTE — Progress Notes (Signed)
 Progress Note    RONIT CRANFIELD  NFA:[id number] DOB: 03/11/1933  DOA: 12/14/2023 PCP: Rex Castor, MD      Brief Narrative:    Medical records reviewed and are as summarized below:  Leslie Parker is a 89 y.o. female  with medical history significant of chronic HFrEF with last EF of 25-30%, SSS s/p PPM, atrial fibrillation on Eliquis , COPD, hypertension, hyperlipidemia, CKD stage IV, osteopenia, patient with multiple admissions in the past, actually this is her 11th admission just this year within less than 66-month.   - Patient was discharged recently with a scapular fracture, and she was noted to have worsening renal function, so her torsemide  had been held at discharge (on 11/29/2023) with recommendation to repeat labs in 3 days and resume once normalized.  Unfortunately, it does not appear patient followed up with her PCP for labs as recommended so she had not resumed her diuretics.    In ED her labs significant for creatinine 1.9 (down from 2.9), BNP significantly elevated at more 4500, she had no leukocytosis, chest x-ray significant for volume overload, so [facility] hospitalist requested to admit for volume overload.     Assessment/Plan:   Principal Problem:   Acute on chronic systolic CHF (congestive heart failure) (HCC) Active Problems:   Sick sinus syndrome (HCC)   Stage 3b chronic kidney disease (HCC) - Baseline scr 1.8-2.0   Type II diabetes mellitus with renal manifestations (HCC)    Body mass index is 21.26 kg/m.   Acute on chronic HFrEF: Continue IV Lasix .  Monitor BMP, daily weight and urine output. BNP greater than 4,500. 2D echo in January 2025 showed EF estimated at 25 to 30%, mild LVH, indeterminate LV diastolic parameters, moderate MR, mild to moderate TR   CKD stage IV: Likely due to cardiorenal syndrome.  Creatinine appears to be at baseline.   Paroxysmal atrial fibrillation: Continue amiodarone  and Eliquis    Type II DM: Glucose levels are  stable.  Hemoglobin A1c 7.   COPD: Compensated   Patient has been hospitalized many times within the past 6 months.  She is still at increased risk for readmission and prognosis is poor.   Outpatient follow-up with palliative care recommended.     Diet Order             Diet heart healthy/carb modified Room service appropriate? Yes; Fluid consistency: Thin  Diet effective now                            Consultants: None  Procedures: None    Medications:    amiodarone   200 mg Oral Daily   apixaban   2.5 mg Oral BID   ascorbic acid   250 mg Oral Daily   budesonide -glycopyrrolate -formoterol   2 puff Inhalation BID   cholecalciferol   1,000 Units Oral Daily   cyanocobalamin   2,500 mcg Oral Daily   docusate sodium   100 mg Oral BID   feeding supplement  237 mL Oral BID BM   furosemide   40 mg Intravenous Q12H   lisinopril   5 mg Oral Daily   multivitamin with minerals  1 tablet Oral Daily   Continuous Infusions:   Anti-infectives (From admission, onward)    None              Family Communication/Anticipated D/C date and plan/Code Status   DVT prophylaxis: apixaban  (ELIQUIS ) tablet 2.5 mg Start: 12/14/23 1530 apixaban  (ELIQUIS ) tablet 2.5 mg  Code Status: Limited: Do not attempt resuscitation (DNR) -DNR-LIMITED -Do Not Intubate/DNI   Family Communication: None Disposition Plan: Plan to discharge home   Status is: Inpatient Remains inpatient appropriate because: CHF exacerbation       Subjective:   Interval events noted.  Breathing is a little better.  No chest pain.   Objective:    Vitals:   12/15/23 0435 12/15/23 0500 12/15/23 0745 12/15/23 1111  BP: (!) 164/83  (!) 166/78 (!) 155/74  Pulse: 71  68 66  Resp: 17     Temp: 98 F (36.7 C)  97.8 F (36.6 C) 98 F (36.7 C)  TempSrc:      SpO2: 96%  98% 95%  Weight:  65.3 kg    Height:       No data found.  No intake or output data in the 24 hours ending 12/15/23  1242 Filed Weights   12/14/23 1534 12/15/23 0500  Weight: 60.9 kg 65.3 kg    Exam:  GEN: NAD SKIN: Warm and dry EYES: No pallor or icterus ENT: MMM CV: RRR PULM: Mild bibasilar rales.  No wheezing ABD: soft, ND, NT, +BS CNS: AAO x 3, non focal EXT: Bilateral leg edema improving    Data Reviewed:   I have personally reviewed following labs and imaging studies:  Labs: Labs show the following:   Basic Metabolic Panel: Recent Labs  Lab 12/14/23 0827 12/15/23 0444  NA 135 141  K 4.3 4.7  CL 105 106  CO2 18* 26  GLUCOSE 147* 100*  BUN 47* 48*  CREATININE 1.90* 2.02*  CALCIUM 8.6* 8.8*   GFR Estimated Creatinine Clearance: 19.8 mL/min (A) (by C-G formula based on SCr of 2.02 mg/dL (H)). Liver Function Tests: Recent Labs  Lab 12/14/23 0827  AST 29  ALT 20  ALKPHOS 106  BILITOT 1.1  PROT 6.3*  ALBUMIN  3.3*   No results for input(s): "LIPASE", "AMYLASE" in the last 168 hours. No results for input(s): "AMMONIA" in the last 168 hours. Coagulation profile No results for input(s): "INR", "PROTIME" in the last 168 hours.  CBC: Recent Labs  Lab 12/14/23 0827 12/15/23 0444  WBC 5.8 5.1  NEUTROABS 4.0  --   HGB 12.1 11.3*  HCT 36.1 33.8*  MCV 94.3 94.7  PLT 271 242   Cardiac Enzymes: No results for input(s): "CKTOTAL", "CKMB", "CKMBINDEX", "TROPONINI" in the last 168 hours. BNP (last 3 results) No results for input(s): "PROBNP" in the last 8760 hours. CBG: No results for input(s): "GLUCAP" in the last 168 hours. D-Dimer: No results for input(s): "DDIMER" in the last 72 hours. Hgb A1c: No results for input(s): "HGBA1C" in the last 72 hours. Lipid Profile: No results for input(s): "CHOL", "HDL", "LDLCALC", "TRIG", "CHOLHDL", "LDLDIRECT" in the last 72 hours. Thyroid  function studies: No results for input(s): "TSH", "T4TOTAL", "T3FREE", "THYROIDAB" in the last 72 hours.  Invalid input(s): "FREET3" Anemia work up: No results for input(s):  "VITAMINB12", "FOLATE", "FERRITIN", "TIBC", "IRON", "RETICCTPCT" in the last 72 hours. Sepsis Labs: Recent Labs  Lab 12/14/23 0827 12/15/23 0444  WBC 5.8 5.1    Microbiology Recent Results (from the past 240 hours)  Resp panel by RT-PCR (RSV, Flu A&B, Covid) Anterior Nasal Swab     Status: None   Collection Time: 12/14/23  8:27 AM   Specimen: Anterior Nasal Swab  Result Value Ref Range Status   SARS Coronavirus 2 by RT PCR NEGATIVE NEGATIVE Final    Comment: (NOTE) SARS-CoV-2 target nucleic acids are  NOT DETECTED.  The SARS-CoV-2 RNA is generally detectable in upper respiratory specimens during the acute phase of infection. The lowest concentration of SARS-CoV-2 viral copies this assay can detect is 138 copies/mL. A negative result does not preclude SARS-Cov-2 infection and should not be used as the sole basis for treatment or other patient management decisions. A negative result may occur with  improper specimen collection/handling, submission of specimen other than nasopharyngeal swab, presence of viral mutation(s) within the areas targeted by this assay, and inadequate number of viral copies(<138 copies/mL). A negative result must be combined with clinical observations, patient history, and epidemiological information. The expected result is Negative.  Fact Sheet for Patients:  BloggerCourse.com  Fact Sheet for Healthcare Providers:  SeriousBroker.it  This test is no t yet approved or cleared by the [place]  FDA and  has been authorized for detection and/or diagnosis of SARS-CoV-2 by FDA under an Emergency Use Authorization (EUA). This EUA will remain  in effect (meaning this test can be used) for the duration of the COVID-19 declaration under Section 564(b)(1) of the Act, 21 U.S.C.section 360bbb-3(b)(1), unless the authorization is terminated  or revoked sooner.       Influenza A by PCR NEGATIVE NEGATIVE Final    Influenza B by PCR NEGATIVE NEGATIVE Final    Comment: (NOTE) The Xpert Xpress SARS-CoV-2/FLU/RSV plus assay is intended as an aid in the diagnosis of influenza from Nasopharyngeal swab specimens and should not be used as a sole basis for treatment. Nasal washings and aspirates are unacceptable for Xpert Xpress SARS-CoV-2/FLU/RSV testing.  Fact Sheet for Patients: BloggerCourse.com  Fact Sheet for Healthcare Providers: SeriousBroker.it  This test is not yet approved or cleared by the [place]  FDA and has been authorized for detection and/or diagnosis of SARS-CoV-2 by FDA under an Emergency Use Authorization (EUA). This EUA will remain in effect (meaning this test can be used) for the duration of the COVID-19 declaration under Section 564(b)(1) of the Act, 21 U.S.C. section 360bbb-3(b)(1), unless the authorization is terminated or revoked.     Resp Syncytial Virus by PCR NEGATIVE NEGATIVE Final    Comment: (NOTE) Fact Sheet for Patients: BloggerCourse.com  Fact Sheet for Healthcare Providers: SeriousBroker.it  This test is not yet approved or cleared by the [place]  FDA and has been authorized for detection and/or diagnosis of SARS-CoV-2 by FDA under an Emergency Use Authorization (EUA). This EUA will remain in effect (meaning this test can be used) for the duration of the COVID-19 declaration under Section 564(b)(1) of the Act, 21 U.S.C. section 360bbb-3(b)(1), unless the authorization is terminated or revoked.  Performed at [facility], [address], [place], [place] [address]     Procedures and diagnostic studies:  DG Chest Portable 1 View Result Date: 12/14/2023 CLINICAL DATA:  Shortness of breath. EXAM: PORTABLE CHEST 1 VIEW COMPARISON:  11/26/2023 FINDINGS: Previous right shoulder arthroplasty. Left chest wall pacer with leads in the right  atrial appendage and right ventricle. The aortic atherosclerotic calcifications. Stable cardiomediastinal contours. Small bilateral pleural effusions and mild interstitial edema. Atelectasis noted within both lung bases. IMPRESSION: 1. Mild congestive heart failure with bibasilar atelectasis. 2.  Aortic Atherosclerosis (ICD10-I70.0). Electronically Signed   By: Kimberley Penman M.D.   On: 12/14/2023 09:30               LOS: 1 day   Chinmay Squier  [facility]   Pager on [email or web address]. If 7PM-7AM, please contact night-coverage at [email or web address]     12/15/2023,  12:42 PM

## 2023-12-15 NOTE — TOC CM/SW Note (Signed)
 Call from [facility] with [facility] she states they had her on their list to follow. Had HH through them in the past.  Awaiting PT/OT evals. TOC to follow up after evals.  Olukemi Panchal, LCSW Transitions of Care Department [phone]

## 2023-12-15 NOTE — Plan of Care (Signed)

## 2023-12-16 DIAGNOSIS — I5023 Acute on chronic systolic (congestive) heart failure: Secondary | ICD-10-CM | POA: Diagnosis not present

## 2023-12-16 LAB — BASIC METABOLIC PANEL WITH GFR
Anion gap: 10 (ref 5–15)
BUN: 50 mg/dL — ABNORMAL HIGH (ref 8–23)
CO2: 24 mmol/L (ref 22–32)
Calcium: 8.3 mg/dL — ABNORMAL LOW (ref 8.9–10.3)
Chloride: 105 mmol/L (ref 98–111)
Creatinine, Ser: 1.95 mg/dL — ABNORMAL HIGH (ref 0.44–1.00)
GFR, Estimated: 24 mL/min — ABNORMAL LOW (ref >60)
Glucose, Bld: 100 mg/dL — ABNORMAL HIGH (ref 70–99)
Potassium: 3.3 mmol/L — ABNORMAL LOW (ref 3.5–5.1)
Sodium: 139 mmol/L (ref 135–145)

## 2023-12-16 MED ORDER — TORSEMIDE 20 MG PO TABS: 20.0000 mg | ORAL_TABLET | Freq: Two times a day (BID) | ORAL | Status: DC

## 2023-12-16 NOTE — TOC Transition Note (Addendum)
 Transition of Care [facility]) - Discharge Note   Patient Details  Name: Leslie Parker MRN: [id number] Date of Birth: 1932-08-28  Transition of Care [facility]) CM/SW Contact:  Johnda Billiot C Lyndol Vanderheiden, RN Phone Number: 12/16/2023, 11:55 AM   Clinical Narrative:    Spoke with patient at bedside Patient was already on the phone with her granddaughter arranging transportation when [facility] entered the room Discussed discharge plan regarding therapy's recommendation for SNF. Patient adamantly stated she was "Not back to [facility] ever!" Patient stated she was going home. She was advised the level of care indicated for her at home was 24/7 care. Patient stated her two granddaughters will be able to provided care for her. She is requested to resume care with [facility]. She was advised [facility] would be in contact with her to schedule her SOC. Patient's granddaughter Murlean Armour will pick her up.    Spoke with Verdis Glade from [facility] to notify of patient's discharge home today.         Patient Goals and CMS Choice            Discharge Placement                       Discharge Plan and Services Additional resources added to the After Visit Summary for                                       Social Drivers of Health (SDOH) Interventions SDOH Screenings   Food Insecurity: No Food Insecurity (12/14/2023)  Housing: Low Risk  (12/14/2023)  Transportation Needs: No Transportation Needs (12/14/2023)  Utilities: Not At Risk (12/14/2023)  Alcohol Screen: Low Risk  (08/20/2023)  Financial Resource Strain: Low Risk  (08/20/2023)  Social Connections: Moderately Isolated (12/14/2023)  Tobacco Use: Low Risk  (12/14/2023)     Readmission Risk Interventions    10/22/2023    2:28 PM 09/16/2023    2:52 PM 08/20/2023    3:45 PM  Readmission Risk Prevention Plan  Transportation Screening Complete Complete Complete  Medication Review Oceanographer) Complete Complete Complete  PCP or Specialist appointment within 3-5  days of discharge  Complete Complete  HRI or Home Care Consult Not Complete Patient refused Complete  HRI or Home Care Consult Pt Refusal Comments No PT recs at this time.    SW Recovery Care/Counseling Consult Complete Complete Complete  Palliative Care Screening Not Applicable Not Applicable Not Applicable  Skilled Nursing Facility Not Applicable Not Applicable Not Applicable

## 2023-12-16 NOTE — Discharge Summary (Signed)
 Physician Discharge Summary   Patient: Leslie Parker MRN: [id number] DOB: 1932/08/30  Admit date:     12/14/2023  Discharge date: {dischdate:26783}  Discharge Physician: Sheril Dines   PCP: Rex Castor, MD   Recommendations at discharge:  {Tip this will not be part of the note when signed- Example include specific recommendations for outpatient follow-up, pending tests to follow-up on. (Optional):26781}  ***  Discharge Diagnoses: Principal Problem:   Acute on chronic systolic CHF (congestive heart failure) (HCC) Active Problems:   Sick sinus syndrome (HCC)   Stage 3b chronic kidney disease (HCC) - Baseline scr 1.8-2.0   Type II diabetes mellitus with renal manifestations (HCC)  Resolved Problems:   * No resolved hospital problems. [facility] Course: No notes on file  Assessment and Plan: No notes have been filed under this hospital service. Service: Hospitalist  152 to 144 lbs***   {Tip this will not be part of the note when signed Body mass index is 20.75 kg/m. , ,  Active Pressure Injury/Wound(s)     Pressure Ulcer  Duration          Pressure Injury 08/20/23 Buttocks Right;Left Stage 1 -  Intact skin with non-blanchable redness of a localized area usually over a bony prominence. 118 days           (Optional):26781}  {(NOTE) Pain control PDMP Statment (Optional):26782} Consultants: *** Procedures performed: ***  Disposition: {Plan; Disposition:26390} Diet recommendation:  Discharge Diet Orders (From admission, onward)     Start     Ordered   12/16/23 0000  Diet - low sodium heart healthy        12/16/23 1034           {Diet_Plan:26776} DISCHARGE MEDICATION: Allergies as of 12/16/2023       Reactions   Baclofen Other (See Comments)   Elevated Cr   Simvastatin Other (See Comments)   Pt denies Elevated LFTs with simva 40mg , stopped and resolved (see MD note 11/10/13)        Medication List     STOP taking these medications     doxycycline  100 MG tablet Commonly known as: VIBRA -TABS       TAKE these medications    acetaminophen  500 MG tablet Commonly known as: TYLENOL  Take 2 tablets (1,000 mg total) by mouth 3 (three) times daily.   albuterol  108 (90 Base) MCG/ACT inhaler Commonly known as: VENTOLIN  HFA Inhale 2 puffs into the lungs every 6 (six) hours as needed.   alum & mag hydroxide-simeth 200-200-20 MG/5ML suspension Commonly known as: MAALOX/MYLANTA Take 30 mLs by mouth every 6 (six) hours as needed for indigestion or heartburn.   amiodarone  200 MG tablet Commonly known as: PACERONE  Take 1 tablet by mouth daily.   apixaban  2.5 MG Tabs tablet Commonly known as: ELIQUIS  Take 1 tablet (2.5 mg total) by mouth 2 (two) times daily.   ascorbic acid  250 MG tablet Commonly known as: VITAMIN C  Take 250 mg by mouth daily.   B-12 2500 MCG Tabs Take 2,500 mcg by mouth daily.   ipratropium-albuterol  0.5-2.5 (3) MG/3ML Soln Commonly known as: DUONEB Inhale 3 mLs into the lungs every 6 (six) hours as needed.   lisinopril  5 MG tablet Commonly known as: ZESTRIL  Take 5 mg by mouth daily.   multivitamin with minerals Tabs tablet Take 1 tablet by mouth daily. One-A-Day Women's Vitamin   ondansetron  4 MG tablet Commonly known as: ZOFRAN  Take 1 tablet (4 mg total) by mouth every 6 (six)  hours as needed for nausea.   oxyCODONE  5 MG immediate release tablet Commonly known as: Oxy IR/ROXICODONE  Take 2.5 mg by mouth every 6 (six) hours as needed for severe pain (pain score 7-10).   polyethylene glycol 17 g packet Commonly known as: MIRALAX  / GLYCOLAX  Take 17 g by mouth daily as needed.   senna-docusate 8.6-50 MG tablet Commonly known as: Senokot-S Take 2 tablets by mouth at bedtime as needed for mild constipation.   torsemide  20 MG tablet Commonly known as: DEMADEX  Take 1 tablet (20 mg total) by mouth 2 (two) times daily.   Trelegy Ellipta 100-62.5-25 MCG/ACT Aepb Generic drug:  Fluticasone -Umeclidin-Vilant Inhale 1 puff into the lungs daily.   Vitamin D -1000 Max St 25 MCG (1000 UT) tablet Generic drug: Cholecalciferol  Take 1,000 Units by mouth daily.        Discharge Exam: Filed Weights   12/14/23 1534 12/15/23 0500 12/16/23 0502  Weight: 60.9 kg 65.3 kg 63.7 kg   ***  Condition at discharge: {DC Condition:26389}  The results of significant diagnostics from this hospitalization (including imaging, microbiology, ancillary and laboratory) are listed below for reference.   Imaging Studies: DG Chest Portable 1 View Result Date: 12/14/2023 CLINICAL DATA:  Shortness of breath. EXAM: PORTABLE CHEST 1 VIEW COMPARISON:  11/26/2023 FINDINGS: Previous right shoulder arthroplasty. Left chest wall pacer with leads in the right atrial appendage and right ventricle. The aortic atherosclerotic calcifications. Stable cardiomediastinal contours. Small bilateral pleural effusions and mild interstitial edema. Atelectasis noted within both lung bases. IMPRESSION: 1. Mild congestive heart failure with bibasilar atelectasis. 2.  Aortic Atherosclerosis (ICD10-I70.0). Electronically Signed   By: Kimberley Penman M.D.   On: 12/14/2023 09:30   DG Abd 1 View Result Date: 11/27/2023 CLINICAL DATA:  Intractable nausea and vomiting EXAM: ABDOMEN - 1 VIEW COMPARISON:  None Available. FINDINGS: The bowel gas pattern is normal. Air seen throughout nondilated large and small bowel to the level of the rectum. Cholecystectomy clips are present as well as vascular calcifications. Right hip screw left hip arthroplasty are present. There is scoliosis and degenerative change throughout the spine. IMPRESSION: Nonobstructive bowel gas pattern. Electronically Signed   By: Tyron Gallon M.D.   On: 11/27/2023 22:23   CT Shoulder Right Wo Contrast Result Date: 11/26/2023 CLINICAL DATA:  Axillary lymphadenopathy is seen. EXAM: CT OF THE UPPER RIGHT EXTREMITY WITHOUT CONTRAST TECHNIQUE: Multidetector CT imaging  of the upper right extremity was performed according to the standard protocol. RADIATION DOSE REDUCTION: This exam was performed according to the departmental dose-optimization program which includes automated exposure control, adjustment of the mA and/or kV according to patient size and/or use of iterative reconstruction technique. COMPARISON:  Right shoulder and humerus radiographs 11/26/2023 FINDINGS: Bones/Joint/Cartilage On today's radiographs, there is question of mild cortical step-off at the inferior aspect of the scapular body, near the inferior angle. On the current CT, linear lucency and mild angulation is seen in this region (axial series 3 images 52 through 66 and coronal series 7 images 84 through 90), this is near the region of the serratus anterior attachment on the deep aspect of the scapula and the teres minor attachment on the superficial/posterior aspect of the inferior scapular angle. There is mild surrounding subcutaneous fat edema and stranding. Postsurgical changes are seen of reverse total right shoulder arthroplasty. Within the limitation of metallic streak artifact, no perihardware lucency is seen to indicate hardware failure or loosening. Moderate to severe acromioclavicular joint space narrowing with subchondral sclerosis and moderate peripheral osteophytosis. Multiple  chronic well corticated superior ossicles. Type III acromion, with mild downsloping of the anterolateral acromion. Ligaments Suboptimally assessed by CT. Muscles and Tendons There is moderate to high-grade supraspinatus and mild-to-moderate infraspinatus muscle atrophy. Mild subscapularis muscle atrophy. Soft tissues No axillary lymphadenopathy is seen. IMPRESSION: 1. An area of interest on the prior radiographs, there is linear lucency and mild angulation at the inferior angle of the scapular body, near the region of the serratus anterior attachment on the deep aspect of the scapula. This suggests a nondisplaced fracture.  2. Postsurgical changes of reverse total right shoulder arthroplasty. No evidence of hardware failure or loosening. 3. 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 4. Moderate to high-grade supraspinatus and mild-to-moderate infraspinatus muscle atrophy. Mild subscapularis muscle atrophy. Electronically Signed   By: Bertina Broccoli M.D.   On: 11/26/2023 17:24   CT Head Wo Contrast Result Date: 11/26/2023 CLINICAL DATA:  Unwitnessed fall.  Trauma to the head and neck EXAM: CT HEAD WITHOUT CONTRAST CT CERVICAL SPINE WITHOUT CONTRAST TECHNIQUE: Multidetector CT imaging of the head and cervical spine was performed following the standard protocol without intravenous contrast. Multiplanar CT image reconstructions of the cervical spine were also generated. RADIATION DOSE REDUCTION: This exam was performed according to the departmental dose-optimization program which includes automated exposure control, adjustment of the mA and/or kV according to patient size and/or use of iterative reconstruction technique. COMPARISON:  None Available. FINDINGS: CT HEAD FINDINGS Brain: Age related volume loss. No focal abnormality affects the brainstem or cerebellum. There is chronic encephalomalacia of the right temporal lobe, probably due to an old stroke. This could possibly relate prior head trauma. Cerebral hemispheres otherwise show chronic small-vessel ischemic change of the white matter. No recent stroke, mass, hemorrhage, hydrocephalus or extra-axial collection. Vascular: There is atherosclerotic calcification of the major vessels at the base of the brain. Skull: Negative Sinuses/Orbits: Clear/normal Other: None CT CERVICAL SPINE FINDINGS Alignment: Cervicothoracic curvature convex to the left. Degenerative anterolisthesis at C6-7 of 3 mm. Skull base and vertebrae: No acute fracture. Chronic fusion at the C2-3 level. Chronic fusion at the C4-5 level. Soft tissues and spinal canal: No traumatic soft tissue finding. Multiple  thyroid  nodules and/or cysts, the largest measuring 3 cm. Normally, thyroid  ultrasound would be recommended in this setting. However, because of the patient's age and comorbidities, follow-up may not be necessary. The likelihood of malignant disease is low. Disc levels: The foramen magnum is widely patent. There is ordinary mild osteoarthritis of the C1-2 articulation but no encroachment upon the neural structures. C2-3: Chronic fusion.  Sufficient patency of the canal and foramina. C3-4: Mobile interspace. Endplate osteophytes. Facet osteophytes. Mild canal stenosis. Bilateral foraminal narrowing that could cause neural compression. C4-5: Chronic fusion.  Sufficient patency of the canal and foramina. C5-6: Mobile level. Endplate osteophytes and facet osteophytes worse on the right. Right foraminal stenosis could compress the right C6 nerve. C6-7: Chronic facet arthropathy worse on the right with anterolisthesis of 3 mm. No central canal stenosis. Foraminal narrowing on the right that could affect the C7 nerve. C7-T1: Facet arthritis on the right. 1-2 mm of degenerative anterolisthesis. Mild foraminal narrowing on the right. Upper chest: Negative Other: None IMPRESSION: HEAD CT: No acute or traumatic finding. Chronic encephalomalacia of the right temporal lobe, probably due to an old stroke. This could possibly relate to prior head trauma. CERVICAL SPINE CT: 1. No acute or traumatic finding. Chronic fusion at C2-3 and C4-5. Degenerative changes at the other levels as outlined above. 2. Multiple thyroid  nodules  and/or cysts, the largest measuring 3 cm. Normally, thyroid  ultrasound would be recommended in this setting. However, because of the patient's age and comorbidities, this may not be necessary. The likelihood of malignant disease is low. Electronically Signed   By: Bettylou Brunner M.D.   On: 11/26/2023 17:04   CT Cervical Spine Wo Contrast Result Date: 11/26/2023 CLINICAL DATA:  Unwitnessed fall.  Trauma to the  head and neck EXAM: CT HEAD WITHOUT CONTRAST CT CERVICAL SPINE WITHOUT CONTRAST TECHNIQUE: Multidetector CT imaging of the head and cervical spine was performed following the standard protocol without intravenous contrast. Multiplanar CT image reconstructions of the cervical spine were also generated. RADIATION DOSE REDUCTION: This exam was performed according to the departmental dose-optimization program which includes automated exposure control, adjustment of the mA and/or kV according to patient size and/or use of iterative reconstruction technique. COMPARISON:  None Available. FINDINGS: CT HEAD FINDINGS Brain: Age related volume loss. No focal abnormality affects the brainstem or cerebellum. There is chronic encephalomalacia of the right temporal lobe, probably due to an old stroke. This could possibly relate prior head trauma. Cerebral hemispheres otherwise show chronic small-vessel ischemic change of the white matter. No recent stroke, mass, hemorrhage, hydrocephalus or extra-axial collection. Vascular: There is atherosclerotic calcification of the major vessels at the base of the brain. Skull: Negative Sinuses/Orbits: Clear/normal Other: None CT CERVICAL SPINE FINDINGS Alignment: Cervicothoracic curvature convex to the left. Degenerative anterolisthesis at C6-7 of 3 mm. Skull base and vertebrae: No acute fracture. Chronic fusion at the C2-3 level. Chronic fusion at the C4-5 level. Soft tissues and spinal canal: No traumatic soft tissue finding. Multiple thyroid  nodules and/or cysts, the largest measuring 3 cm. Normally, thyroid  ultrasound would be recommended in this setting. However, because of the patient's age and comorbidities, follow-up may not be necessary. The likelihood of malignant disease is low. Disc levels: The foramen magnum is widely patent. There is ordinary mild osteoarthritis of the C1-2 articulation but no encroachment upon the neural structures. C2-3: Chronic fusion.  Sufficient patency of  the canal and foramina. C3-4: Mobile interspace. Endplate osteophytes. Facet osteophytes. Mild canal stenosis. Bilateral foraminal narrowing that could cause neural compression. C4-5: Chronic fusion.  Sufficient patency of the canal and foramina. C5-6: Mobile level. Endplate osteophytes and facet osteophytes worse on the right. Right foraminal stenosis could compress the right C6 nerve. C6-7: Chronic facet arthropathy worse on the right with anterolisthesis of 3 mm. No central canal stenosis. Foraminal narrowing on the right that could affect the C7 nerve. C7-T1: Facet arthritis on the right. 1-2 mm of degenerative anterolisthesis. Mild foraminal narrowing on the right. Upper chest: Negative Other: None IMPRESSION: HEAD CT: No acute or traumatic finding. Chronic encephalomalacia of the right temporal lobe, probably due to an old stroke. This could possibly relate to prior head trauma. CERVICAL SPINE CT: 1. No acute or traumatic finding. Chronic fusion at C2-3 and C4-5. Degenerative changes at the other levels as outlined above. 2. Multiple thyroid  nodules and/or cysts, the largest measuring 3 cm. Normally, thyroid  ultrasound would be recommended in this setting. However, because of the patient's age and comorbidities, this may not be necessary. The likelihood of malignant disease is low. Electronically Signed   By: Bettylou Brunner M.D.   On: 11/26/2023 17:04   DG Chest 1 View Result Date: 11/26/2023 CLINICAL DATA:  Status post fall EXAM: CHEST  1 VIEW COMPARISON:  November 15, 2023 FINDINGS: The heart size and mediastinal contours are within normal limits. Both lungs are  clear. The visualized skeletal structures are unremarkable. 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   On: 11/26/2023 15:48   DG Shoulder Right Result Date: 11/26/2023 CLINICAL DATA:  Fall. EXAM: RIGHT SHOULDER - 2+ VIEW COMPARISON:  None Available. FINDINGS: Status post right total shoulder arthroplasty. No dislocation is noted. Possible mildly displaced fracture involving inferior portion of right scapula. IMPRESSION: Possible mildly displaced right scapular fracture as noted above. Electronically Signed   By: Rosalene Colon M.D.   On: 11/26/2023 15:44   DG Humerus Right Result Date: 11/26/2023 CLINICAL DATA:  Right arm pain after fall. EXAM: RIGHT HUMERUS - 2+ VIEW COMPARISON:  None Available. FINDINGS: Status post right shoulder arthroplasty. No fracture or dislocation is noted. IMPRESSION: No acute abnormality seen. Electronically Signed   By: Rosalene Colon M.D.   On: 11/26/2023 15:42    Microbiology: Results for orders placed or performed during the hospital encounter of 12/14/23  Resp panel by RT-PCR (RSV, Flu A&B, Covid) Anterior Nasal Swab     Status: None   Collection Time: 12/14/23  8:27 AM   Specimen: Anterior Nasal Swab  Result Value Ref Range Status   SARS Coronavirus 2 by RT PCR NEGATIVE NEGATIVE Final    Comment: (NOTE) SARS-CoV-2 target nucleic acids are NOT DETECTED.  The SARS-CoV-2 RNA is generally detectable in upper respiratory specimens during the acute phase of infection. The lowest concentration of SARS-CoV-2 viral copies this assay can detect is 138 copies/mL. A negative result does not preclude SARS-Cov-2 infection and should not be used as the sole basis for treatment or other patient management decisions. A negative result may occur with  improper specimen collection/handling, submission of specimen other than nasopharyngeal swab, presence of viral mutation(s) within the areas targeted by this assay, and inadequate number of viral copies(<138 copies/mL). A negative result must be combined with clinical observations, patient history, and  epidemiological information. The expected result is Negative.  Fact Sheet for Patients:  BloggerCourse.com  Fact Sheet for Healthcare Providers:  SeriousBroker.it  This test is no t yet approved or cleared by the [place]  FDA and  has been authorized for detection and/or diagnosis of SARS-CoV-2 by FDA under an Emergency Use Authorization (EUA). This EUA will remain  in effect (meaning this test can be used) for the duration of the COVID-19 declaration under Section 564(b)(1) of the Act, 21 U.S.C.section 360bbb-3(b)(1), unless the authorization is terminated  or revoked sooner.       Influenza A by PCR NEGATIVE NEGATIVE Final   Influenza B by PCR NEGATIVE NEGATIVE Final    Comment: (NOTE) The Xpert Xpress SARS-CoV-2/FLU/RSV plus assay is intended as an aid in the diagnosis of influenza from Nasopharyngeal swab specimens and should not be used as a sole basis for treatment. Nasal washings and aspirates are unacceptable for Xpert Xpress SARS-CoV-2/FLU/RSV testing.  Fact Sheet for Patients: BloggerCourse.com  Fact Sheet for Healthcare Providers: SeriousBroker.it  This test is not yet approved or cleared by the [place]  FDA and has been authorized for detection and/or diagnosis of SARS-CoV-2 by FDA under an Emergency Use Authorization (EUA). This EUA will remain in effect (meaning this test can be used) for the duration of the COVID-19 declaration under Section 564(b)(1) of the Act, 21 U.S.C. section  360bbb-3(b)(1), unless the authorization is terminated or revoked.     Resp Syncytial Virus by PCR NEGATIVE NEGATIVE Final    Comment: (NOTE) Fact Sheet for Patients: BloggerCourse.com  Fact Sheet for Healthcare Providers: SeriousBroker.it  This test is not yet approved or cleared by the [place]  FDA and has been  authorized for detection and/or diagnosis of SARS-CoV-2 by FDA under an Emergency Use Authorization (EUA). This EUA will remain in effect (meaning this test can be used) for the duration of the COVID-19 declaration under Section 564(b)(1) of the Act, 21 U.S.C. section 360bbb-3(b)(1), unless the authorization is terminated or revoked.  Performed at [facility], [address] [place], [place] [address]     Labs: CBC: Recent Labs  Lab 12/14/23 0827 12/15/23 0444  WBC 5.8 5.1  NEUTROABS 4.0  --   HGB 12.1 11.3*  HCT 36.1 33.8*  MCV 94.3 94.7  PLT 271 242   Basic Metabolic Panel: Recent Labs  Lab 12/14/23 0827 12/15/23 0444 12/16/23 0537  NA 135 141 139  K 4.3 4.7 3.3*  CL 105 106 105  CO2 18* 26 24  GLUCOSE 147* 100* 100*  BUN 47* 48* 50*  CREATININE 1.90* 2.02* 1.95*  CALCIUM 8.6* 8.8* 8.3*   Liver Function Tests: Recent Labs  Lab 12/14/23 0827  AST 29  ALT 20  ALKPHOS 106  BILITOT 1.1  PROT 6.3*  ALBUMIN  3.3*   CBG: No results for input(s): "GLUCAP" in the last 168 hours.  Discharge time spent: {LESS THAN/GREATER UEAV:40981} 30 minutes.  Signed: Sheril Dines, MD [facility] 12/16/2023

## 2023-12-16 NOTE — Plan of Care (Signed)

## 2023-12-19 DIAGNOSIS — J4489 Other specified chronic obstructive pulmonary disease: Secondary | ICD-10-CM | POA: Diagnosis not present

## 2023-12-19 DIAGNOSIS — E1122 Type 2 diabetes mellitus with diabetic chronic kidney disease: Secondary | ICD-10-CM | POA: Diagnosis not present

## 2023-12-19 DIAGNOSIS — S42101D Fracture of unspecified part of scapula, right shoulder, subsequent encounter for fracture with routine healing: Secondary | ICD-10-CM | POA: Diagnosis not present

## 2023-12-19 DIAGNOSIS — I5042 Chronic combined systolic (congestive) and diastolic (congestive) heart failure: Secondary | ICD-10-CM | POA: Diagnosis not present

## 2023-12-19 DIAGNOSIS — I480 Paroxysmal atrial fibrillation: Secondary | ICD-10-CM | POA: Diagnosis not present

## 2023-12-19 DIAGNOSIS — M1990 Unspecified osteoarthritis, unspecified site: Secondary | ICD-10-CM | POA: Diagnosis not present

## 2023-12-19 DIAGNOSIS — E785 Hyperlipidemia, unspecified: Secondary | ICD-10-CM | POA: Diagnosis not present

## 2023-12-19 DIAGNOSIS — I130 Hypertensive heart and chronic kidney disease with heart failure and stage 1 through stage 4 chronic kidney disease, or unspecified chronic kidney disease: Secondary | ICD-10-CM | POA: Diagnosis not present

## 2023-12-19 DIAGNOSIS — N184 Chronic kidney disease, stage 4 (severe): Secondary | ICD-10-CM | POA: Diagnosis not present

## 2023-12-23 DIAGNOSIS — N184 Chronic kidney disease, stage 4 (severe): Secondary | ICD-10-CM | POA: Diagnosis not present

## 2023-12-23 DIAGNOSIS — M1990 Unspecified osteoarthritis, unspecified site: Secondary | ICD-10-CM | POA: Diagnosis not present

## 2023-12-23 DIAGNOSIS — I480 Paroxysmal atrial fibrillation: Secondary | ICD-10-CM | POA: Diagnosis not present

## 2023-12-23 DIAGNOSIS — I5042 Chronic combined systolic (congestive) and diastolic (congestive) heart failure: Secondary | ICD-10-CM | POA: Diagnosis not present

## 2023-12-23 DIAGNOSIS — S42101D Fracture of unspecified part of scapula, right shoulder, subsequent encounter for fracture with routine healing: Secondary | ICD-10-CM | POA: Diagnosis not present

## 2023-12-23 DIAGNOSIS — E785 Hyperlipidemia, unspecified: Secondary | ICD-10-CM | POA: Diagnosis not present

## 2023-12-23 DIAGNOSIS — I130 Hypertensive heart and chronic kidney disease with heart failure and stage 1 through stage 4 chronic kidney disease, or unspecified chronic kidney disease: Secondary | ICD-10-CM | POA: Diagnosis not present

## 2023-12-23 DIAGNOSIS — J4489 Other specified chronic obstructive pulmonary disease: Secondary | ICD-10-CM | POA: Diagnosis not present

## 2023-12-23 DIAGNOSIS — E1122 Type 2 diabetes mellitus with diabetic chronic kidney disease: Secondary | ICD-10-CM | POA: Diagnosis not present

## 2023-12-24 DIAGNOSIS — M1990 Unspecified osteoarthritis, unspecified site: Secondary | ICD-10-CM | POA: Diagnosis not present

## 2023-12-24 DIAGNOSIS — E1122 Type 2 diabetes mellitus with diabetic chronic kidney disease: Secondary | ICD-10-CM | POA: Diagnosis not present

## 2023-12-24 DIAGNOSIS — I10 Essential (primary) hypertension: Secondary | ICD-10-CM | POA: Diagnosis not present

## 2023-12-24 DIAGNOSIS — I5042 Chronic combined systolic (congestive) and diastolic (congestive) heart failure: Secondary | ICD-10-CM | POA: Diagnosis not present

## 2023-12-24 DIAGNOSIS — J4489 Other specified chronic obstructive pulmonary disease: Secondary | ICD-10-CM | POA: Diagnosis not present

## 2023-12-24 DIAGNOSIS — N1832 Chronic kidney disease, stage 3b: Secondary | ICD-10-CM | POA: Diagnosis not present

## 2023-12-24 DIAGNOSIS — I130 Hypertensive heart and chronic kidney disease with heart failure and stage 1 through stage 4 chronic kidney disease, or unspecified chronic kidney disease: Secondary | ICD-10-CM | POA: Diagnosis not present

## 2023-12-24 DIAGNOSIS — I495 Sick sinus syndrome: Secondary | ICD-10-CM | POA: Diagnosis not present

## 2023-12-24 DIAGNOSIS — S42101D Fracture of unspecified part of scapula, right shoulder, subsequent encounter for fracture with routine healing: Secondary | ICD-10-CM | POA: Diagnosis not present

## 2023-12-24 DIAGNOSIS — J449 Chronic obstructive pulmonary disease, unspecified: Secondary | ICD-10-CM | POA: Diagnosis not present

## 2023-12-24 DIAGNOSIS — I480 Paroxysmal atrial fibrillation: Secondary | ICD-10-CM | POA: Diagnosis not present

## 2023-12-24 DIAGNOSIS — I4891 Unspecified atrial fibrillation: Secondary | ICD-10-CM | POA: Diagnosis not present

## 2023-12-24 DIAGNOSIS — I5020 Unspecified systolic (congestive) heart failure: Secondary | ICD-10-CM | POA: Diagnosis not present

## 2023-12-24 DIAGNOSIS — I5022 Chronic systolic (congestive) heart failure: Secondary | ICD-10-CM | POA: Diagnosis not present

## 2023-12-24 DIAGNOSIS — N184 Chronic kidney disease, stage 4 (severe): Secondary | ICD-10-CM | POA: Diagnosis not present

## 2023-12-24 DIAGNOSIS — E785 Hyperlipidemia, unspecified: Secondary | ICD-10-CM | POA: Diagnosis not present

## 2023-12-24 NOTE — Progress Notes (Signed)
 Established Patient Visit   Chief Complaint: Chief Complaint  Patient presents with  . Chronic systolic heart failure (CMS/HHS-HCC)  . Follow-up   Date of Service: 12/24/2023 Date of Birth: May 23, 1933 PCP: Sherial Bail, MD  History of Present Illness: Ms. Storey is a 89 y.o.female patient who history of hypertension who was recently hospitalized for diverticulitis.  She also has bradycardia with a existing permanent pacemaker.  During the hospitalization for diverticulitis she was found to have a decrease in ejection fraction with a left ventricular EF of 30% and left ventricular dimension of 5 cm.  There was moderate mitral regurgitation.  She responded well to diuresis and there was some modifications made in her medical regimen.    6 hosp since last summer. Dc one week ago with plan for hospice but no communication with grand daughter. For now plan to continue treatment. Pall care set up for online visit   Interval History: Last visit 11/26/23.  Continues to have numerous hospitalizations. Hospitalized 5/6-5/9 for mechanical fall and scapular fracture  Hospitalized 5/24-5/26 for acute on chronic HF. Got IV diureteic. Torsemide  20 mg BID.  Very unclear about her medications. Family member with her also very unclear.  Currently thinks swelling acceptable although still present. Lives alone.  Denies orthopnea, PND, LH/syncope   Past Medical and Surgical History  Past Medical History Past Medical History:  Diagnosis Date  . Asthma, unspecified asthma severity, unspecified whether complicated, unspecified whether persistent (HHS-HCC)   . COPD (chronic obstructive pulmonary disease) (CMS/HHS-HCC)   . Diabetes mellitus without complication (CMS/HHS-HCC)   . Hypertension   . Hypoxia   . Osteoarthritis   . Osteopenia     Past Surgical History She has a past surgical history that includes cholecystectomy; Hysterectomy Vaginal; Cholecystectomy; Hernia repair; Total shoulder replacement  (Left); Replacement total knee (Left); Replacement total knee (Right); Replacement total hip w/  resurfacing implants (Left); Insert / replace / remove pacemaker (11/15/2022); and Reduction and internal fixation of displaced intertrochanteric right hip fracture with [employer] TFN nail (Right, 02/13/2023).   Medications and Allergies  Current Medications  Current Outpatient Medications  Medication Sig Dispense Refill  . ACCU-CHEK GUIDE TEST STRIPS test strip TEST BLOOD SUGAR THREE TIMES DAILY USE AS INSTRUCTED 300 strip 10  . ACCU-CHEK SOFTCLIX LANCETS lancets TEST THREE TIMES DAILY  AS  INSTRUCTED 300 each 3  . acetaminophen  (TYLENOL ) 325 MG tablet Take 650 mg by mouth    . albuterol  MDI, PROVENTIL , VENTOLIN , PROAIR , HFA 90 mcg/actuation inhaler Inhale 2 inhalations into the lungs every 6 (six) hours as needed for Wheezing or Shortness of Breath 3 each 2  . AMIOdarone  (PACERONE ) 200 MG tablet TAKE 1 TABLET BY MOUTH EVERY DAY 90 tablet 0  . apixaban  (ELIQUIS ) 2.5 mg tablet Take 1 tablet (2.5 mg total) by mouth 2 (two) times daily 180 tablet 3  . ascorbic acid , vitamin C , (VITAMIN C ) 1000 MG tablet Take 1,000 mg by mouth once daily    . blood-glucose calibrat control Cmpk Use as directed. 1 each 5  . carvediloL  (COREG ) 6.25 MG tablet TAKE 1 TABLET BY MOUTH 2 TIMES DAILY WITH A MEAL.    . cholecalciferol  (VITAMIN D3) 1000 unit tablet Take 1,000 Units by mouth once daily    . cyanocobalamin  (VITAMIN B12) 1000 MCG tablet Take 1,000 mcg by mouth once daily    . empagliflozin  (JARDIANCE ) 10 mg tablet Take 1 tablet (10 mg total) by mouth once daily 30 tablet 11  . hydrALAZINE  (APRESOLINE ) 25  MG tablet TAKE 1 TABLET (25 MG TOTAL) BY MOUTH 3 (THREE) TIMES DAILY AS NEEDED (FOR SBP >150 CONISTENTLY OR DBP>100 PERSISTENTLY) 270 tablet 1  . multivitamin tablet Take 1 tablet by mouth once daily    . omeprazole (PRILOSEC) 40 MG DR capsule Take 1 capsule (40 mg total) by mouth 2 (two) times daily before  meals 180 capsule 1  . ondansetron  (ZOFRAN ) 4 MG tablet Take 4 mg by mouth every 6 (six) hours as needed    . sacubitriL -valsartan  (ENTRESTO ) 24-26 mg tablet Take 1 tablet by mouth 2 (two) times daily    . TRELEGY ELLIPTA 100-62.5-25 mcg inhaler INHALE 1 PUFF INTO THE LUNGS ONCE DAILY. 60 each 5  . ipratropium-albuteroL  (DUO-NEB) nebulizer solution TAKE 3 MLS BY NEBULIZATION 3 (THREE) TIMES DAILY FOR 30 DAYS 270 mL 0   No current facility-administered medications for this visit.    Allergies: Baclofen and Simvastatin  Social and Family History  Social History  reports that she has never smoked. She has never used smokeless tobacco. She reports that she does not currently use alcohol. She reports that she does not use drugs.  Family History Family History  Problem Relation Name Age of Onset  . High blood pressure (Hypertension) Mother    . Heart failure Mother    . Emphysema Father    . No Known Problems Sister    . Stroke Brother    . No Known Problems Sister      Review of Systems   Review of Systems: The patient denies chest pain, shortness of breath, orthopnea, paroxysmal nocturnal dyspnea,  palpitations, heart racing, presyncope, syncope. Review of 10 Systems is negative except as described above.  Physical Examination   Vitals:BP 118/70 (BP Location: Left upper arm, Patient Position: Sitting, BP Cuff Size: Adult)   Pulse 63   Ht 175.3 cm (5' 9)   Wt 64.5 kg (142 lb 3.2 oz)   SpO2 97%   BMI 21.00 kg/m  Ht:175.3 cm (5' 9) Wt:64.5 kg (142 lb 3.2 oz) ADJ:Anib surface area is 1.77 meters squared. Body mass index is 21 kg/m.  HEENT: sclera anicteric, frail older woman Lungs: clear to auscultation bilaterally Heart: Regular rate and rhythm.  MR murmur Abdomen: soft  Extremities: no edema, normal pulses   TTE 08/09/23  1. Left ventricular ejection fraction, by estimation, is 25 to 30%. Left ventricular ejection fraction by 2D MOD biplane is 26.6 %. The left ventricle  has severely decreased function. The left ventricle demonstrates global hypokinesis. There is mild  left ventricular hypertrophy. Left ventricular diastolic parameters are indeterminate.   2. Right ventricular systolic function is normal. The right ventricular size is normal. There is mildly elevated pulmonary artery systolic pressure.   3. Left atrial size was moderately dilated.   4. The mitral valve is degenerative. Moderate mitral valve regurgitation.   5. Tricuspid valve regurgitation is mild to moderate.   6. The aortic valve is calcified. Aortic valve regurgitation is not visualized. Aortic valve sclerosis/calcification is present, without any evidence of aortic stenosis. Aortic valve mean gradient measures 8.5 mmHg.   7. The inferior vena cava is dilated in size with >50% respiratory variability, suggesting right atrial pressure of 8 mmHg.    Assessment and Plan   89 yo F w frailty, HFrEF, CKD, HTN, SSS w PPM, COPD, AF on apixaban  presents for follow-up.   Continues to have innumerable hospitalizations. Since last visit, one of these was for mechanical fall and other for worsening  HF.  She is not interested in hospice and continues to live alone. Extreme confusion of her medications, even with her family member who comes today and is in charge of filling pill box.   Currently prescribed reasonably good GDMT but hard to know what she is taking. Emphasized to patient and caregiver that need to bring pill bottles to next clinic visit. Seems clinically stable today and will  not make changes, especially with the confusion over what she is already on.   PLAN  -BMP, proBNP today  -continue torsemide  20 mg BID  -cont empa 10 QD, sac/val 24/26 BID, carvedilol  6.25 BID. Not eligible for MRA with eGFR <30  -cont apixaban  and amio for AF  -prescribed hydralazine  but again not sure if taking.  Ideally would remove this at future visit if indeed taking and maximize BB and ARNI   RTC in 1  month   I personally performed the service. (TP) I spent a total of 30 minutes in both face-to-face and non-face-to-face activities, excluding procedures performed, for this visit on the date of this encounter.   STEPHEN NORLEEN PINES, MD

## 2023-12-27 DIAGNOSIS — N184 Chronic kidney disease, stage 4 (severe): Secondary | ICD-10-CM | POA: Diagnosis not present

## 2023-12-27 DIAGNOSIS — J4489 Other specified chronic obstructive pulmonary disease: Secondary | ICD-10-CM | POA: Diagnosis not present

## 2023-12-27 DIAGNOSIS — I130 Hypertensive heart and chronic kidney disease with heart failure and stage 1 through stage 4 chronic kidney disease, or unspecified chronic kidney disease: Secondary | ICD-10-CM | POA: Diagnosis not present

## 2023-12-27 DIAGNOSIS — E785 Hyperlipidemia, unspecified: Secondary | ICD-10-CM | POA: Diagnosis not present

## 2023-12-27 DIAGNOSIS — I5042 Chronic combined systolic (congestive) and diastolic (congestive) heart failure: Secondary | ICD-10-CM | POA: Diagnosis not present

## 2023-12-27 DIAGNOSIS — I480 Paroxysmal atrial fibrillation: Secondary | ICD-10-CM | POA: Diagnosis not present

## 2023-12-27 DIAGNOSIS — E1122 Type 2 diabetes mellitus with diabetic chronic kidney disease: Secondary | ICD-10-CM | POA: Diagnosis not present

## 2023-12-27 DIAGNOSIS — M1990 Unspecified osteoarthritis, unspecified site: Secondary | ICD-10-CM | POA: Diagnosis not present

## 2023-12-27 DIAGNOSIS — S42101D Fracture of unspecified part of scapula, right shoulder, subsequent encounter for fracture with routine healing: Secondary | ICD-10-CM | POA: Diagnosis not present

## 2024-01-01 DIAGNOSIS — S42101D Fracture of unspecified part of scapula, right shoulder, subsequent encounter for fracture with routine healing: Secondary | ICD-10-CM | POA: Diagnosis not present

## 2024-01-01 DIAGNOSIS — E1122 Type 2 diabetes mellitus with diabetic chronic kidney disease: Secondary | ICD-10-CM | POA: Diagnosis not present

## 2024-01-01 DIAGNOSIS — M1990 Unspecified osteoarthritis, unspecified site: Secondary | ICD-10-CM | POA: Diagnosis not present

## 2024-01-01 DIAGNOSIS — N184 Chronic kidney disease, stage 4 (severe): Secondary | ICD-10-CM | POA: Diagnosis not present

## 2024-01-01 DIAGNOSIS — I480 Paroxysmal atrial fibrillation: Secondary | ICD-10-CM | POA: Diagnosis not present

## 2024-01-01 DIAGNOSIS — I5042 Chronic combined systolic (congestive) and diastolic (congestive) heart failure: Secondary | ICD-10-CM | POA: Diagnosis not present

## 2024-01-01 DIAGNOSIS — E785 Hyperlipidemia, unspecified: Secondary | ICD-10-CM | POA: Diagnosis not present

## 2024-01-01 DIAGNOSIS — I130 Hypertensive heart and chronic kidney disease with heart failure and stage 1 through stage 4 chronic kidney disease, or unspecified chronic kidney disease: Secondary | ICD-10-CM | POA: Diagnosis not present

## 2024-01-01 DIAGNOSIS — J4489 Other specified chronic obstructive pulmonary disease: Secondary | ICD-10-CM | POA: Diagnosis not present

## 2024-01-02 DIAGNOSIS — I130 Hypertensive heart and chronic kidney disease with heart failure and stage 1 through stage 4 chronic kidney disease, or unspecified chronic kidney disease: Secondary | ICD-10-CM | POA: Diagnosis not present

## 2024-01-02 DIAGNOSIS — J4489 Other specified chronic obstructive pulmonary disease: Secondary | ICD-10-CM | POA: Diagnosis not present

## 2024-01-02 DIAGNOSIS — M1990 Unspecified osteoarthritis, unspecified site: Secondary | ICD-10-CM | POA: Diagnosis not present

## 2024-01-02 DIAGNOSIS — E1122 Type 2 diabetes mellitus with diabetic chronic kidney disease: Secondary | ICD-10-CM | POA: Diagnosis not present

## 2024-01-02 DIAGNOSIS — I480 Paroxysmal atrial fibrillation: Secondary | ICD-10-CM | POA: Diagnosis not present

## 2024-01-02 DIAGNOSIS — E785 Hyperlipidemia, unspecified: Secondary | ICD-10-CM | POA: Diagnosis not present

## 2024-01-02 DIAGNOSIS — N184 Chronic kidney disease, stage 4 (severe): Secondary | ICD-10-CM | POA: Diagnosis not present

## 2024-01-02 DIAGNOSIS — I5042 Chronic combined systolic (congestive) and diastolic (congestive) heart failure: Secondary | ICD-10-CM | POA: Diagnosis not present

## 2024-01-02 DIAGNOSIS — S42101D Fracture of unspecified part of scapula, right shoulder, subsequent encounter for fracture with routine healing: Secondary | ICD-10-CM | POA: Diagnosis not present

## 2024-01-03 DIAGNOSIS — S42101D Fracture of unspecified part of scapula, right shoulder, subsequent encounter for fracture with routine healing: Secondary | ICD-10-CM | POA: Diagnosis not present

## 2024-01-03 DIAGNOSIS — M1990 Unspecified osteoarthritis, unspecified site: Secondary | ICD-10-CM | POA: Diagnosis not present

## 2024-01-03 DIAGNOSIS — I130 Hypertensive heart and chronic kidney disease with heart failure and stage 1 through stage 4 chronic kidney disease, or unspecified chronic kidney disease: Secondary | ICD-10-CM | POA: Diagnosis not present

## 2024-01-03 DIAGNOSIS — N184 Chronic kidney disease, stage 4 (severe): Secondary | ICD-10-CM | POA: Diagnosis not present

## 2024-01-03 DIAGNOSIS — J4489 Other specified chronic obstructive pulmonary disease: Secondary | ICD-10-CM | POA: Diagnosis not present

## 2024-01-03 DIAGNOSIS — I480 Paroxysmal atrial fibrillation: Secondary | ICD-10-CM | POA: Diagnosis not present

## 2024-01-03 DIAGNOSIS — E785 Hyperlipidemia, unspecified: Secondary | ICD-10-CM | POA: Diagnosis not present

## 2024-01-03 DIAGNOSIS — E1122 Type 2 diabetes mellitus with diabetic chronic kidney disease: Secondary | ICD-10-CM | POA: Diagnosis not present

## 2024-01-03 DIAGNOSIS — I5042 Chronic combined systolic (congestive) and diastolic (congestive) heart failure: Secondary | ICD-10-CM | POA: Diagnosis not present

## 2024-01-07 DIAGNOSIS — I5042 Chronic combined systolic (congestive) and diastolic (congestive) heart failure: Secondary | ICD-10-CM | POA: Diagnosis not present

## 2024-01-07 DIAGNOSIS — S42101D Fracture of unspecified part of scapula, right shoulder, subsequent encounter for fracture with routine healing: Secondary | ICD-10-CM | POA: Diagnosis not present

## 2024-01-07 DIAGNOSIS — E1122 Type 2 diabetes mellitus with diabetic chronic kidney disease: Secondary | ICD-10-CM | POA: Diagnosis not present

## 2024-01-07 DIAGNOSIS — M1990 Unspecified osteoarthritis, unspecified site: Secondary | ICD-10-CM | POA: Diagnosis not present

## 2024-01-07 DIAGNOSIS — I480 Paroxysmal atrial fibrillation: Secondary | ICD-10-CM | POA: Diagnosis not present

## 2024-01-07 DIAGNOSIS — J4489 Other specified chronic obstructive pulmonary disease: Secondary | ICD-10-CM | POA: Diagnosis not present

## 2024-01-07 DIAGNOSIS — N184 Chronic kidney disease, stage 4 (severe): Secondary | ICD-10-CM | POA: Diagnosis not present

## 2024-01-07 DIAGNOSIS — E785 Hyperlipidemia, unspecified: Secondary | ICD-10-CM | POA: Diagnosis not present

## 2024-01-07 DIAGNOSIS — I130 Hypertensive heart and chronic kidney disease with heart failure and stage 1 through stage 4 chronic kidney disease, or unspecified chronic kidney disease: Secondary | ICD-10-CM | POA: Diagnosis not present

## 2024-01-08 DIAGNOSIS — I5042 Chronic combined systolic (congestive) and diastolic (congestive) heart failure: Secondary | ICD-10-CM | POA: Diagnosis not present

## 2024-01-08 DIAGNOSIS — N184 Chronic kidney disease, stage 4 (severe): Secondary | ICD-10-CM | POA: Diagnosis not present

## 2024-01-08 DIAGNOSIS — E785 Hyperlipidemia, unspecified: Secondary | ICD-10-CM | POA: Diagnosis not present

## 2024-01-08 DIAGNOSIS — S42101D Fracture of unspecified part of scapula, right shoulder, subsequent encounter for fracture with routine healing: Secondary | ICD-10-CM | POA: Diagnosis not present

## 2024-01-08 DIAGNOSIS — J4489 Other specified chronic obstructive pulmonary disease: Secondary | ICD-10-CM | POA: Diagnosis not present

## 2024-01-08 DIAGNOSIS — M1990 Unspecified osteoarthritis, unspecified site: Secondary | ICD-10-CM | POA: Diagnosis not present

## 2024-01-08 DIAGNOSIS — E1122 Type 2 diabetes mellitus with diabetic chronic kidney disease: Secondary | ICD-10-CM | POA: Diagnosis not present

## 2024-01-08 DIAGNOSIS — I130 Hypertensive heart and chronic kidney disease with heart failure and stage 1 through stage 4 chronic kidney disease, or unspecified chronic kidney disease: Secondary | ICD-10-CM | POA: Diagnosis not present

## 2024-01-08 DIAGNOSIS — I480 Paroxysmal atrial fibrillation: Secondary | ICD-10-CM | POA: Diagnosis not present

## 2024-01-13 DIAGNOSIS — N189 Chronic kidney disease, unspecified: Secondary | ICD-10-CM | POA: Diagnosis not present

## 2024-01-13 DIAGNOSIS — I5033 Acute on chronic diastolic (congestive) heart failure: Secondary | ICD-10-CM | POA: Diagnosis not present

## 2024-01-13 DIAGNOSIS — I480 Paroxysmal atrial fibrillation: Secondary | ICD-10-CM | POA: Diagnosis not present

## 2024-01-13 DIAGNOSIS — M25552 Pain in left hip: Secondary | ICD-10-CM | POA: Diagnosis not present

## 2024-01-13 DIAGNOSIS — E1122 Type 2 diabetes mellitus with diabetic chronic kidney disease: Secondary | ICD-10-CM | POA: Diagnosis not present

## 2024-01-14 DIAGNOSIS — S42101D Fracture of unspecified part of scapula, right shoulder, subsequent encounter for fracture with routine healing: Secondary | ICD-10-CM | POA: Diagnosis not present

## 2024-01-14 DIAGNOSIS — I5042 Chronic combined systolic (congestive) and diastolic (congestive) heart failure: Secondary | ICD-10-CM | POA: Diagnosis not present

## 2024-01-14 DIAGNOSIS — E1122 Type 2 diabetes mellitus with diabetic chronic kidney disease: Secondary | ICD-10-CM | POA: Diagnosis not present

## 2024-01-14 DIAGNOSIS — I130 Hypertensive heart and chronic kidney disease with heart failure and stage 1 through stage 4 chronic kidney disease, or unspecified chronic kidney disease: Secondary | ICD-10-CM | POA: Diagnosis not present

## 2024-01-14 DIAGNOSIS — N184 Chronic kidney disease, stage 4 (severe): Secondary | ICD-10-CM | POA: Diagnosis not present

## 2024-01-14 DIAGNOSIS — J4489 Other specified chronic obstructive pulmonary disease: Secondary | ICD-10-CM | POA: Diagnosis not present

## 2024-01-14 DIAGNOSIS — I480 Paroxysmal atrial fibrillation: Secondary | ICD-10-CM | POA: Diagnosis not present

## 2024-01-14 DIAGNOSIS — M1990 Unspecified osteoarthritis, unspecified site: Secondary | ICD-10-CM | POA: Diagnosis not present

## 2024-01-14 DIAGNOSIS — E785 Hyperlipidemia, unspecified: Secondary | ICD-10-CM | POA: Diagnosis not present

## 2024-01-15 DIAGNOSIS — E785 Hyperlipidemia, unspecified: Secondary | ICD-10-CM | POA: Diagnosis not present

## 2024-01-15 DIAGNOSIS — E1122 Type 2 diabetes mellitus with diabetic chronic kidney disease: Secondary | ICD-10-CM | POA: Diagnosis not present

## 2024-01-15 DIAGNOSIS — I130 Hypertensive heart and chronic kidney disease with heart failure and stage 1 through stage 4 chronic kidney disease, or unspecified chronic kidney disease: Secondary | ICD-10-CM | POA: Diagnosis not present

## 2024-01-15 DIAGNOSIS — J4489 Other specified chronic obstructive pulmonary disease: Secondary | ICD-10-CM | POA: Diagnosis not present

## 2024-01-15 DIAGNOSIS — I5042 Chronic combined systolic (congestive) and diastolic (congestive) heart failure: Secondary | ICD-10-CM | POA: Diagnosis not present

## 2024-01-15 DIAGNOSIS — N184 Chronic kidney disease, stage 4 (severe): Secondary | ICD-10-CM | POA: Diagnosis not present

## 2024-01-15 DIAGNOSIS — I480 Paroxysmal atrial fibrillation: Secondary | ICD-10-CM | POA: Diagnosis not present

## 2024-01-15 DIAGNOSIS — M1990 Unspecified osteoarthritis, unspecified site: Secondary | ICD-10-CM | POA: Diagnosis not present

## 2024-01-15 DIAGNOSIS — S42101D Fracture of unspecified part of scapula, right shoulder, subsequent encounter for fracture with routine healing: Secondary | ICD-10-CM | POA: Diagnosis not present

## 2024-01-16 DIAGNOSIS — I130 Hypertensive heart and chronic kidney disease with heart failure and stage 1 through stage 4 chronic kidney disease, or unspecified chronic kidney disease: Secondary | ICD-10-CM | POA: Diagnosis not present

## 2024-01-16 DIAGNOSIS — I480 Paroxysmal atrial fibrillation: Secondary | ICD-10-CM | POA: Diagnosis not present

## 2024-01-16 DIAGNOSIS — E785 Hyperlipidemia, unspecified: Secondary | ICD-10-CM | POA: Diagnosis not present

## 2024-01-16 DIAGNOSIS — M1990 Unspecified osteoarthritis, unspecified site: Secondary | ICD-10-CM | POA: Diagnosis not present

## 2024-01-16 DIAGNOSIS — N184 Chronic kidney disease, stage 4 (severe): Secondary | ICD-10-CM | POA: Diagnosis not present

## 2024-01-16 DIAGNOSIS — I5042 Chronic combined systolic (congestive) and diastolic (congestive) heart failure: Secondary | ICD-10-CM | POA: Diagnosis not present

## 2024-01-16 DIAGNOSIS — E1122 Type 2 diabetes mellitus with diabetic chronic kidney disease: Secondary | ICD-10-CM | POA: Diagnosis not present

## 2024-01-16 DIAGNOSIS — S42101D Fracture of unspecified part of scapula, right shoulder, subsequent encounter for fracture with routine healing: Secondary | ICD-10-CM | POA: Diagnosis not present

## 2024-01-16 DIAGNOSIS — J4489 Other specified chronic obstructive pulmonary disease: Secondary | ICD-10-CM | POA: Diagnosis not present

## 2024-01-20 ENCOUNTER — Emergency Department

## 2024-01-20 ENCOUNTER — Inpatient Hospital Stay
Admission: EM | Admit: 2024-01-20 | Discharge: 2024-01-23 | DRG: 291 | Disposition: A | Discharge status: Home Health | Attending: Internal Medicine | Admitting: Internal Medicine

## 2024-01-20 ENCOUNTER — Other Ambulatory Visit: Payer: Self-pay

## 2024-01-20 DIAGNOSIS — N184 Chronic kidney disease, stage 4 (severe): Secondary | ICD-10-CM | POA: Diagnosis present

## 2024-01-20 DIAGNOSIS — Z7984 Long term (current) use of oral hypoglycemic drugs: Secondary | ICD-10-CM | POA: Diagnosis not present

## 2024-01-20 DIAGNOSIS — Z6822 Body mass index (BMI) 22.0-22.9, adult: Secondary | ICD-10-CM

## 2024-01-20 DIAGNOSIS — Z950 Presence of cardiac pacemaker: Secondary | ICD-10-CM | POA: Diagnosis not present

## 2024-01-20 DIAGNOSIS — N189 Chronic kidney disease, unspecified: Secondary | ICD-10-CM | POA: Diagnosis not present

## 2024-01-20 DIAGNOSIS — J9601 Acute respiratory failure with hypoxia: Secondary | ICD-10-CM | POA: Diagnosis present

## 2024-01-20 DIAGNOSIS — J9600 Acute respiratory failure, unspecified whether with hypoxia or hypercapnia: Secondary | ICD-10-CM | POA: Diagnosis not present

## 2024-01-20 DIAGNOSIS — Z7901 Long term (current) use of anticoagulants: Secondary | ICD-10-CM | POA: Diagnosis not present

## 2024-01-20 DIAGNOSIS — Z90710 Acquired absence of both cervix and uterus: Secondary | ICD-10-CM | POA: Diagnosis not present

## 2024-01-20 DIAGNOSIS — I495 Sick sinus syndrome: Secondary | ICD-10-CM | POA: Diagnosis present

## 2024-01-20 DIAGNOSIS — I509 Heart failure, unspecified: Secondary | ICD-10-CM

## 2024-01-20 DIAGNOSIS — I10 Essential (primary) hypertension: Secondary | ICD-10-CM | POA: Diagnosis not present

## 2024-01-20 DIAGNOSIS — R918 Other nonspecific abnormal finding of lung field: Secondary | ICD-10-CM | POA: Diagnosis not present

## 2024-01-20 DIAGNOSIS — I130 Hypertensive heart and chronic kidney disease with heart failure and stage 1 through stage 4 chronic kidney disease, or unspecified chronic kidney disease: Principal | ICD-10-CM | POA: Diagnosis present

## 2024-01-20 DIAGNOSIS — R062 Wheezing: Secondary | ICD-10-CM | POA: Diagnosis not present

## 2024-01-20 DIAGNOSIS — E440 Moderate protein-calorie malnutrition: Secondary | ICD-10-CM | POA: Diagnosis present

## 2024-01-20 DIAGNOSIS — I480 Paroxysmal atrial fibrillation: Secondary | ICD-10-CM | POA: Diagnosis not present

## 2024-01-20 DIAGNOSIS — E119 Type 2 diabetes mellitus without complications: Secondary | ICD-10-CM | POA: Diagnosis not present

## 2024-01-20 DIAGNOSIS — E43 Unspecified severe protein-calorie malnutrition: Secondary | ICD-10-CM | POA: Diagnosis present

## 2024-01-20 DIAGNOSIS — E1122 Type 2 diabetes mellitus with diabetic chronic kidney disease: Secondary | ICD-10-CM | POA: Diagnosis present

## 2024-01-20 DIAGNOSIS — R001 Bradycardia, unspecified: Secondary | ICD-10-CM | POA: Diagnosis not present

## 2024-01-20 DIAGNOSIS — I482 Chronic atrial fibrillation, unspecified: Secondary | ICD-10-CM | POA: Diagnosis not present

## 2024-01-20 DIAGNOSIS — I5043 Acute on chronic combined systolic (congestive) and diastolic (congestive) heart failure: Secondary | ICD-10-CM | POA: Diagnosis not present

## 2024-01-20 DIAGNOSIS — Z96611 Presence of right artificial shoulder joint: Secondary | ICD-10-CM | POA: Diagnosis present

## 2024-01-20 DIAGNOSIS — Z888 Allergy status to other drugs, medicaments and biological substances status: Secondary | ICD-10-CM

## 2024-01-20 DIAGNOSIS — Z79899 Other long term (current) drug therapy: Secondary | ICD-10-CM

## 2024-01-20 DIAGNOSIS — M8580 Other specified disorders of bone density and structure, unspecified site: Secondary | ICD-10-CM | POA: Diagnosis present

## 2024-01-20 DIAGNOSIS — J4489 Other specified chronic obstructive pulmonary disease: Secondary | ICD-10-CM | POA: Diagnosis present

## 2024-01-20 DIAGNOSIS — R110 Nausea: Secondary | ICD-10-CM | POA: Diagnosis not present

## 2024-01-20 DIAGNOSIS — I5022 Chronic systolic (congestive) heart failure: Secondary | ICD-10-CM | POA: Diagnosis not present

## 2024-01-20 DIAGNOSIS — J811 Chronic pulmonary edema: Secondary | ICD-10-CM | POA: Diagnosis not present

## 2024-01-20 DIAGNOSIS — J449 Chronic obstructive pulmonary disease, unspecified: Secondary | ICD-10-CM | POA: Diagnosis present

## 2024-01-20 DIAGNOSIS — I5033 Acute on chronic diastolic (congestive) heart failure: Secondary | ICD-10-CM

## 2024-01-20 DIAGNOSIS — I4891 Unspecified atrial fibrillation: Secondary | ICD-10-CM | POA: Diagnosis not present

## 2024-01-20 DIAGNOSIS — Z825 Family history of asthma and other chronic lower respiratory diseases: Secondary | ICD-10-CM | POA: Diagnosis not present

## 2024-01-20 DIAGNOSIS — I5023 Acute on chronic systolic (congestive) heart failure: Principal | ICD-10-CM

## 2024-01-20 DIAGNOSIS — I447 Left bundle-branch block, unspecified: Secondary | ICD-10-CM | POA: Diagnosis present

## 2024-01-20 DIAGNOSIS — R0989 Other specified symptoms and signs involving the circulatory and respiratory systems: Secondary | ICD-10-CM | POA: Diagnosis not present

## 2024-01-20 DIAGNOSIS — Z515 Encounter for palliative care: Secondary | ICD-10-CM

## 2024-01-20 DIAGNOSIS — Z66 Do not resuscitate: Secondary | ICD-10-CM | POA: Diagnosis present

## 2024-01-20 DIAGNOSIS — Z7189 Other specified counseling: Secondary | ICD-10-CM | POA: Diagnosis not present

## 2024-01-20 DIAGNOSIS — Z96653 Presence of artificial knee joint, bilateral: Secondary | ICD-10-CM | POA: Diagnosis present

## 2024-01-20 DIAGNOSIS — E785 Hyperlipidemia, unspecified: Secondary | ICD-10-CM | POA: Diagnosis present

## 2024-01-20 DIAGNOSIS — Z8249 Family history of ischemic heart disease and other diseases of the circulatory system: Secondary | ICD-10-CM

## 2024-01-20 LAB — COMPREHENSIVE METABOLIC PANEL WITH GFR
ALT: 27 U/L (ref 0–44)
AST: 34 U/L (ref 15–41)
Albumin: 3 g/dL — ABNORMAL LOW (ref 3.5–5.0)
Alkaline Phosphatase: 99 U/L (ref 38–126)
Anion gap: 12 (ref 5–15)
BUN: 59 mg/dL — ABNORMAL HIGH (ref 8–23)
CO2: 21 mmol/L — ABNORMAL LOW (ref 22–32)
Calcium: 8.5 mg/dL — ABNORMAL LOW (ref 8.9–10.3)
Chloride: 104 mmol/L (ref 98–111)
Creatinine, Ser: 2.24 mg/dL — ABNORMAL HIGH (ref 0.44–1.00)
GFR, Estimated: 20 mL/min — ABNORMAL LOW (ref >60)
Glucose, Bld: 171 mg/dL — ABNORMAL HIGH (ref 70–99)
Potassium: 3.9 mmol/L (ref 3.5–5.1)
Sodium: 137 mmol/L (ref 135–145)
Total Bilirubin: 1 mg/dL (ref 0.0–1.2)
Total Protein: 6 g/dL — ABNORMAL LOW (ref 6.5–8.1)

## 2024-01-20 LAB — CBC WITH DIFFERENTIAL/PLATELET
Abs Immature Granulocytes: 0.02 10*3/uL (ref 0.00–0.07)
Basophils Absolute: 0.1 10*3/uL (ref 0.0–0.1)
Basophils Relative: 1 %
Eosinophils Absolute: 0.1 10*3/uL (ref 0.0–0.5)
Eosinophils Relative: 2 %
HCT: 32.8 % — ABNORMAL LOW (ref 36.0–46.0)
Hemoglobin: 10.8 g/dL — ABNORMAL LOW (ref 12.0–15.0)
Immature Granulocytes: 0 %
Lymphocytes Relative: 23 %
Lymphs Abs: 1.3 10*3/uL (ref 0.7–4.0)
MCH: 31.8 pg (ref 26.0–34.0)
MCHC: 32.9 g/dL (ref 30.0–36.0)
MCV: 96.5 fL (ref 80.0–100.0)
Monocytes Absolute: 0.3 10*3/uL (ref 0.1–1.0)
Monocytes Relative: 6 %
Neutro Abs: 3.8 10*3/uL (ref 1.7–7.7)
Neutrophils Relative %: 68 %
Platelets: 261 10*3/uL (ref 150–400)
RBC: 3.4 MIL/uL — ABNORMAL LOW (ref 3.87–5.11)
RDW: 16.1 % — ABNORMAL HIGH (ref 11.5–15.5)
WBC: 5.7 10*3/uL (ref 4.0–10.5)
nRBC: 0 % (ref 0.0–0.2)

## 2024-01-20 LAB — TROPONIN I (HIGH SENSITIVITY)
Troponin I (High Sensitivity): 36 ng/L — ABNORMAL HIGH (ref <18)
Troponin I (High Sensitivity): 46 ng/L — ABNORMAL HIGH (ref <18)

## 2024-01-20 LAB — BLOOD GAS, VENOUS
Acid-base deficit: 1.3 mmol/L (ref 0.0–2.0)
Bicarbonate: 23.7 mmol/L (ref 20.0–28.0)
O2 Saturation: 73.6 %
Patient temperature: 37
pCO2, Ven: 40 mmHg — ABNORMAL LOW (ref 44–60)
pH, Ven: 7.38 (ref 7.25–7.43)
pO2, Ven: 45 mmHg (ref 32–45)

## 2024-01-20 LAB — BRAIN NATRIURETIC PEPTIDE: B Natriuretic Peptide: >4500 pg/mL — ABNORMAL HIGH (ref 0.0–100.0)

## 2024-01-20 MED ORDER — TAMSULOSIN HCL 0.4 MG PO CAPS
0.4000 mg | ORAL_CAPSULE | Freq: Every day | ORAL | Status: DC
  Administered 2024-01-20 – 2024-01-23 (×4): 0.4 mg via ORAL
  Filled 2024-01-20 (×4): qty 1

## 2024-01-20 MED ORDER — PHENAZOPYRIDINE HCL 100 MG PO TABS
100.0000 mg | ORAL_TABLET | Freq: Two times a day (BID) | ORAL | Status: DC | PRN
  Filled 2024-01-20: qty 1

## 2024-01-20 MED ORDER — FUROSEMIDE 10 MG/ML IJ SOLN
80.0000 mg | Freq: Once | INTRAMUSCULAR | Status: AC
  Administered 2024-01-20: 80 mg via INTRAVENOUS
  Filled 2024-01-20: qty 8

## 2024-01-20 MED ORDER — COLESTIPOL HCL 1 G PO TABS
2.0000 g | ORAL_TABLET | Freq: Two times a day (BID) | ORAL | Status: DC
  Administered 2024-01-20 – 2024-01-23 (×6): 2 g via ORAL
  Filled 2024-01-20 (×7): qty 2

## 2024-01-20 MED ORDER — HYOSCYAMINE SULFATE 0.125 MG PO TBDP
0.1250 mg | ORAL_TABLET | Freq: Four times a day (QID) | ORAL | Status: DC | PRN
  Administered 2024-01-20: 0.125 mg via SUBLINGUAL
  Filled 2024-01-20 (×3): qty 1

## 2024-01-20 MED ORDER — BUDESON-GLYCOPYRROL-FORMOTEROL 160-9-4.8 MCG/ACT IN AERO
2.0000 | INHALATION_SPRAY | Freq: Two times a day (BID) | RESPIRATORY_TRACT | Status: DC
  Administered 2024-01-21 – 2024-01-23 (×5): 2 via RESPIRATORY_TRACT
  Filled 2024-01-20: qty 5.9

## 2024-01-20 MED ORDER — ISOSORBIDE MONONITRATE ER 30 MG PO TB24
30.0000 mg | ORAL_TABLET | Freq: Every day | ORAL | Status: DC
  Administered 2024-01-20 – 2024-01-23 (×4): 30 mg via ORAL
  Filled 2024-01-20 (×4): qty 1

## 2024-01-20 MED ORDER — AMIODARONE HCL 200 MG PO TABS
200.0000 mg | ORAL_TABLET | Freq: Every day | ORAL | Status: DC
  Administered 2024-01-20 – 2024-01-23 (×4): 200 mg via ORAL
  Filled 2024-01-20 (×4): qty 1

## 2024-01-20 MED ORDER — FUROSEMIDE 10 MG/ML IJ SOLN
60.0000 mg | Freq: Once | INTRAMUSCULAR | Status: AC
  Administered 2024-01-20: 60 mg via INTRAVENOUS
  Filled 2024-01-20: qty 8

## 2024-01-20 MED ORDER — HYDRALAZINE HCL 20 MG/ML IJ SOLN
10.0000 mg | INTRAMUSCULAR | Status: DC | PRN
  Administered 2024-01-20 (×3): 10 mg via INTRAVENOUS
  Filled 2024-01-20 (×3): qty 1

## 2024-01-20 MED ORDER — EMPAGLIFLOZIN 10 MG PO TABS
10.0000 mg | ORAL_TABLET | Freq: Every day | ORAL | Status: DC
  Administered 2024-01-20 – 2024-01-23 (×4): 10 mg via ORAL
  Filled 2024-01-20 (×4): qty 1

## 2024-01-20 MED ORDER — APIXABAN 2.5 MG PO TABS
2.5000 mg | ORAL_TABLET | Freq: Two times a day (BID) | ORAL | Status: DC
  Administered 2024-01-20 – 2024-01-23 (×7): 2.5 mg via ORAL
  Filled 2024-01-20 (×8): qty 1

## 2024-01-20 NOTE — ED Notes (Signed)
 Informed Presenter, broadcasting of assigned bed via ascom

## 2024-01-20 NOTE — H&P (Signed)
 History and Physical    Leslie Parker FMW:[id number] DOB: 1933/06/03 DOA: 01/20/2024  PCP: Sherial Bail, MD  Patient coming from: home   Chief Complaint: shortness of breath  HPI: Leslie Parker is a 89 y.o. female with medical history significant for hfref, a-fib, ckd 4, copd, dm, htn, bradycardia s/p pacemaker, frequent hospitalizations, who presents with the above.  Endorses compliance with her home meds. Reports dyspnea last night worse with lying down, alerted EMS, reportedly found to be in the low 80s when they arrived. No cough or fever. No chest pain. No leg swelling. No palpitations. No nausea/vomiting/diarrhea, no abdominal pain. Reports breathing is somewhat better after ED treatment but still not back to baseline.     Review of Systems: As per HPI otherwise 10 point review of systems negative.    Past Medical History:  Diagnosis Date   Acute kidney injury superimposed on CKD (HCC) 11/24/2022   Asthma    Atrial fibrillation with RVR (HCC)    Cancer (HCC)    Basal Cell   CHF (congestive heart failure) (HCC)    Closed left hip fracture (HCC) 01/25/2017   Closed right hip fracture (HCC) 02/13/2023   Diabetes mellitus without complication (HCC)    Femur fracture, left (HCC) 02/03/2015   Heart murmur    Hypertension    Impetigo    Osteoarthritis    Osteopenia    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   Rapid atrial fibrillation, new onset(HCC) 06/05/2022   Vomiting    can not due to surgery   Wears dentures    full upper and lower    Past Surgical History:  Procedure Laterality Date   ABDOMINAL HYSTERECTOMY     BACK SURGERY     BLADDER SURGERY     mesh   CARPAL TUNNEL RELEASE Bilateral    CATARACT EXTRACTION Right 2017   CATARACT EXTRACTION W/PHACO Left 02/06/2016   Procedure: CATARACT EXTRACTION PHACO AND INTRAOCULAR LENS PLACEMENT (IOC) left eye;  Surgeon: Donzell Arlyce Budd, MD;  Location: [facility];  Service: Ophthalmology;  Laterality:  Left;  DIABETIC LEFT Cannot arrive before 9:30   CERVICAL DISC SURGERY     CHOLECYSTECTOMY     EYE SURGERY Bilateral    Cataract Extraction with IOL   FEMUR IM NAIL Left 02/04/2015   Procedure: INTRAMEDULLARY (IM) NAIL FEMORAL;  Surgeon: Franky Cranker, MD;  Location: [facility];  Service: Orthopedics;  Laterality: Left;   HARDWARE REMOVAL Left 01/17/2017   Procedure: HARDWARE REMOVAL;  Surgeon: Cranker Franky, MD;  Location: [facility];  Service: Orthopedics;  Laterality: Left;   HERNIA REPAIR  2014   esophageal and gastric mesh. patient unable to throw up d/t mesh   HIP ARTHROPLASTY Left 01/26/2017   Procedure: ARTHROPLASTY BIPOLAR HIP (HEMIARTHROPLASTY) removal hardware left hip;  Surgeon: Cleotilde Barrio, MD;  Location: [facility];  Service: Orthopedics;  Laterality: Left;   INTRAMEDULLARY (IM) NAIL INTERTROCHANTERIC Right 02/13/2023   Procedure: INTRAMEDULLARY (IM) NAIL INTERTROCHANTERIC;  Surgeon: Edie Norleen PARAS, MD;  Location: [facility];  Service: Orthopedics;  Laterality: Right;   JOINT REPLACEMENT Bilateral    knees   PACEMAKER IMPLANT N/A 11/15/2022   Procedure: PACEMAKER IMPLANT;  Surgeon: Ammon Blunt, MD;  Location: [facility];  Service: Cardiovascular;  Laterality: N/A;   PACEMAKER IMPLANT N/A 11/28/2022   Procedure: PACEMAKER IMPLANT;  Surgeon: Ammon Blunt, MD;  Location: [facility];  Service: Cardiovascular;  Laterality: N/A;  Lead reposition   REPLACEMENT TOTAL KNEE BILATERAL Bilateral  [id number]   SHOULDER ARTHROSCOPY WITH OPEN ROTATOR CUFF REPAIR AND DISTAL CLAVICLE ACROMINECTOMY Left 10/25/2016   Procedure: SHOULDER ARTHROSCOPY WITH OPEN ROTATOR CUFF REPAIR AND DISTAL CLAVICLE ACROMINECTOMY;  Surgeon: Franky Cranker, MD;  Location: [facility];  Service: Orthopedics;  Laterality: Left;   THYROID  SURGERY     goiter removed   TOTAL HIP REVISION Left 12/02/2017   Procedure: TOTAL HIP REVISION;  Surgeon: Leora Lynwood SAUNDERS, MD;  Location: [facility];  Service:  Orthopedics;  Laterality: Left;   TOTAL SHOULDER REPLACEMENT Right 2012     reports that she has never smoked. She has never used smokeless tobacco. She reports that she does not drink alcohol and does not use drugs.  Allergies  Allergen Reactions   Baclofen Other (See Comments)    Elevated Cr   Simvastatin Other (See Comments)    Pt denies  Elevated LFTs with simva 40mg , stopped and resolved (see MD note 11/10/13)    Family History  Problem Relation Age of Onset   Heart failure Mother    Hypertension Mother    Emphysema Father     Prior to Admission medications   Medication Sig Start Date End Date Taking? Authorizing Provider  acetaminophen  (TYLENOL ) 500 MG tablet Take 2 tablets (1,000 mg total) by mouth 3 (three) times daily. 11/29/23  Yes Maree Hue, MD  albuterol  (VENTOLIN  HFA) 108 (90 Base) MCG/ACT inhaler Inhale 2 puffs into the lungs every 6 (six) hours as needed. 08/04/19  Yes [provider]  alum & mag hydroxide-simeth (MAALOX/MYLANTA) 200-200-20 MG/5ML suspension Take 30 mLs by mouth every 6 (six) hours as needed for indigestion or heartburn. 11/29/23  Yes Maree Hue, MD  amiodarone  (PACERONE ) 200 MG tablet Take 1 tablet by mouth daily. 08/26/23  Yes [provider]  apixaban  (ELIQUIS ) 2.5 MG TABS tablet Take 1 tablet (2.5 mg total) by mouth 2 (two) times daily. 06/07/22  Yes Sreenath, Sudheer B, MD  ascorbic acid  (VITAMIN C ) 250 MG tablet Take 250 mg by mouth daily.   Yes [provider]  Cholecalciferol  (VITAMIN D -1000 MAX ST) 25 MCG (1000 UT) tablet Take 1,000 Units by mouth daily.   Yes [provider]  colestipol  (COLESTID ) 1 g tablet Take 2 g by mouth.  Take 2 g by mouth 2 (two) times daily   Yes [provider]  Cyanocobalamin  (B-12) 2500 MCG TABS Take 2,500 mcg by mouth daily.   Yes [provider]  isosorbide  mononitrate (IMDUR ) 30 MG 24 hr tablet Take 30 mg by mouth.  Take 30 mg by mouth at bedtime   Yes [provider]  JARDIANCE  10 MG TABS tablet  11/26/23  Yes [provider]  lisinopril  (ZESTRIL ) 5 MG tablet Take 5 mg by mouth daily.   Yes [provider]  Multiple Vitamin (MULTIVITAMIN WITH MINERALS) TABS tablet Take 1 tablet by mouth daily. One-A-Day Women's Vitamin   Yes [provider]  ondansetron  (ZOFRAN ) 4 MG tablet Take 1 tablet (4 mg total) by mouth every 6 (six) hours as needed for nausea. 11/17/23  Yes Wieting, Richard, MD  polyethylene glycol (MIRALAX  / GLYCOLAX ) 17 g packet Take 17 g by mouth daily as needed. 11/29/23  Yes Maree Hue, MD  senna-docusate (SENOKOT-S) 8.6-50 MG tablet Take 2 tablets by mouth at bedtime as needed for mild constipation. 11/29/23  Yes Maree Hue, MD  torsemide  (DEMADEX ) 20 MG tablet Take 1 tablet (20 mg total) by mouth 2 (two) times daily. 12/16/23  Yes Jens Durand,  MD  TRELEGY ELLIPTA 100-62.5-25 MCG/ACT AEPB Inhale 1 puff into the lungs daily. 08/03/22  Yes [provider]  ipratropium-albuterol  (DUONEB) 0.5-2.5 (3) MG/3ML SOLN Inhale 3 mLs into the lungs every 6 (six) hours as needed. Patient not taking: Reported on 01/20/2024 06/01/22   [provider]  oxyCODONE  (OXY IR/ROXICODONE ) 5 MG immediate release tablet Take 2.5 mg by mouth every 6 (six) hours as needed for severe pain (pain score 7-10). Patient not taking: Reported on 01/20/2024    [provider]    Physical Exam: Vitals:   01/20/24 0850 01/20/24 0900 01/20/24 1130 01/20/24 1133  BP:  (!) 180/87 (!) 184/82   Pulse:  85 70   Resp:  (!) 24 (!) 28   Temp:    97.9 F (36.6 C)  TempSrc:    Oral  SpO2:  99% 99%   Weight: 68.9 kg     Height: 5' 9 (1.753 m)       Constitutional: No acute distress Head: Atraumatic Eyes: Conjunctiva clear ENM: Moist mucous membranes. Normal dentition.  Neck: Supple Respiratory: Clear to auscultation bilaterally save for rales at bases Cardiovascular: Regular rate and rhythm. Soft systolic murmur.  Distant heart sounds Abdomen: Non-tender, non-distended.   Musculoskeletal: No joint deformity upper and lower extremities.  Skin: No rashes, lesions, or ulcers.  Extremities: trace LE peripheral edema. Palpable peripheral pulses. Neurologic: Alert, moving all 4 extremities. Psychiatric: Normal insight and judgement.   Labs on Admission: I have personally reviewed following labs and imaging studies  CBC: Recent Labs  Lab 01/20/24 0917  WBC 5.7  NEUTROABS 3.8  HGB 10.8*  HCT 32.8*  MCV 96.5  PLT 261   Basic Metabolic Panel: Recent Labs  Lab 01/20/24 0917  NA 137  K 3.9  CL 104  CO2 21*  GLUCOSE 171*  BUN 59*  CREATININE 2.24*  CALCIUM 8.5*   GFR: Estimated Creatinine Clearance: 17.8 mL/min (A) (by C-G formula based on SCr of 2.24 mg/dL (H)). Liver Function Tests: Recent Labs  Lab 01/20/24 0917  AST 34  ALT 27  ALKPHOS 99  BILITOT 1.0  PROT 6.0*  ALBUMIN  3.0*   No results for input(s): LIPASE, AMYLASE in the last 168 hours. No results for input(s): AMMONIA in the last 168 hours. Coagulation Profile: No results for input(s): INR, PROTIME in the last 168 hours. Cardiac Enzymes: No results for input(s): CKTOTAL, CKMB, CKMBINDEX, TROPONINI in the last 168 hours. BNP (last 3 results) No results for input(s): PROBNP in the last 8760 hours. HbA1C: No results for input(s): HGBA1C in the last 72 hours. CBG: No results for input(s): GLUCAP in the last 168 hours. Lipid Profile: No results for input(s): CHOL, HDL, LDLCALC, TRIG, CHOLHDL, LDLDIRECT in the last 72 hours. Thyroid  Function Tests: No results for input(s): TSH, T4TOTAL, FREET4, T3FREE, THYROIDAB in the last 72 hours. Anemia Panel: No results for input(s): VITAMINB12, FOLATE, FERRITIN, TIBC, IRON, RETICCTPCT in the last 72 hours. Urine analysis:    Component Value Date/Time   COLORURINE YELLOW (A) 11/05/2023 1505   APPEARANCEUR CLEAR (A)  11/05/2023 1505   APPEARANCEUR Clear 02/19/2013 0254   LABSPEC 1.012 11/05/2023 1505   LABSPEC 1.006 02/19/2013 0254   PHURINE 6.0 11/05/2023 1505   GLUCOSEU NEGATIVE 11/05/2023 1505   GLUCOSEU Negative 02/19/2013 0254   HGBUR NEGATIVE 11/05/2023 1505   BILIRUBINUR NEGATIVE 11/05/2023 1505   BILIRUBINUR Negative 02/19/2013 0254   KETONESUR NEGATIVE 11/05/2023 1505   PROTEINUR 100 (A) 11/05/2023 1505   NITRITE NEGATIVE  11/05/2023 1505   LEUKOCYTESUR NEGATIVE 11/05/2023 1505   LEUKOCYTESUR 1+ 02/19/2013 0254    Radiological Exams on Admission: DG Chest Portable 1 View Result Date: 01/20/2024 CLINICAL DATA:  Shortness of breath. EXAM: PORTABLE CHEST 1 VIEW COMPARISON:  Chest radiograph dated 12/14/2023. FINDINGS: Cardiomegaly with vascular congestion and edema. Pneumonia is not excluded. No large pleural effusion. No pneumothorax. Atherosclerotic calcification of the aorta left pectoral pacemaker device. Degenerative changes of the spine. Right shoulder arthroplasty. No acute osseous pathology. IMPRESSION: CHF slightly progressed since the prior radiograph. Pneumonia is not excluded. Electronically Signed   By: Vanetta Chou M.D.   On: 01/20/2024 09:57    EKG: Independently reviewed. Regular rate  Assessment/Plan Principal Problem:   Acute exacerbation of CHF (congestive heart failure) (HCC) Active Problems:   Acute on chronic combined systolic and diastolic CHF (congestive heart failure) (HCC)   Chronic a-fib (HCC)   Essential hypertension   Diabetes mellitus without complication (HCC)   COPD (chronic obstructive pulmonary disease) (HCC)   CKD (chronic kidney disease) stage 4, GFR 15-29 ml/min (HCC)   Malnutrition of moderate degree   # HFrEF with acute exacerbation One day dyspnea, orthopnea. BNP markedly elevated, cxr with vascular congestion. Reports compliance with home meds but per last cardiology note, a lot of patient confusion regarding meds. This is patient's 12th  hospitalization this year. Has previously declined hospice. Received lasix  80 IV in the ER. - will give another dose of lasix  tonight (60 IV), plan to check kidney function and reassess need for ongoing IV diuresis in the AM - home torsemide  20 bid on hold - cont home jardiance , imdur   # Acute hypoxic respiratory failure Reportedly low 80s when EMS arrived, placed on 4 liters, now appears to be weaned off oxygen, low 90s on room air. No chest pain and trops are low and stable from priors, doubt acs. - monitor - continue above diuresis  # HTN Here bp elevated - diuresis as above - hold home lisinopril  while diuresing  # A-fib Rate controlled - cont home amio, apixaban   # COPD No exacerbation - breztri  for home trelegy  # T2DM euglycemic - home jardiance  - monitor daily fasting sugars with labs  # History bradycardia # Pacemaker status  DVT prophylaxis: home apixaban  Code Status: dnr/dni, confirmed with patient  Family Communication: none at bedside  Consults called: none   Level of care: Telemetry Cardiac Status is: Observation    Devaughn KATHEE Ban MD [facility] Pager [phone]  If 7PM-7AM, please contact night-coverage [email or web address] Password [facility]  01/20/2024, 12:46 PM

## 2024-01-20 NOTE — ED Notes (Addendum)
 Dr Lawence notified through secure chat: Hey Dr Lawence, this pt has been peeing throughout the day. Recently she started saying it was really painful and she couldn't pee. I bladder scanned her and it was 826 mL. She had an output of 700 earlier but out of no where she said she couldn't anymore. Is there anything you want me to do?   Dr Lawence ordered Pyridium and stated If not helping and she is uncomfortable in the next couple of hours u can do I and O Cath

## 2024-01-20 NOTE — ED Provider Notes (Signed)
 [facility] Provider Note    Event Date/Time   First MD Initiated Contact with Patient 01/20/24 [phone]     (approximate)   History   Chief Complaint Shortness of Breath   HPI  Leslie Parker is a 89 y.o. female with past medical history of hypertension, CHFrEF, sick sinus syndrome status post pacemaker, atrial fibrillation on Eliquis , COPD, and CKD who presents to the ED complaining of shortness of breath.  Patient reports that the night before last she was having some difficulty breathing while laying down, but seem to improve yesterday during the day while she was walking around.  She states that her breathing then got bad again last night and continued into the morning, when she contacted EMS.  She denies any pain in her chest and has not had any fevers or cough.  She has not noticed any pain or swelling in her legs.  EMS reports oxygen saturations in the low 80s on room air, patient placed on 4 L nasal cannula with improvement.  EMS reported wheezing and gave 2 DuoNebs during transport.     Physical Exam   Triage Vital Signs: ED Triage Vitals  Encounter Vitals Group     BP      Girls Systolic BP Percentile      Girls Diastolic BP Percentile      Boys Systolic BP Percentile      Boys Diastolic BP Percentile      Pulse      Resp      Temp      Temp src      SpO2      Weight      Height      Head Circumference      Peak Flow      Pain Score      Pain Loc      Pain Education      Exclude from Growth Chart     Most recent vital signs: Vitals:   01/20/24 0848 01/20/24 0900  BP:  (!) 180/87  Pulse:  85  Resp:  (!) 24  SpO2: 100% 99%    Constitutional: Alert and oriented. Eyes: Conjunctivae are normal. Head: Atraumatic. Nose: No congestion/rhinnorhea. Mouth/Throat: Mucous membranes are moist.  Cardiovascular: Normal rate, regular rhythm. Grossly normal heart sounds.  2+ radial pulses bilaterally. Respiratory: Tachypneic with increased  work of breathing, mild wheezing and crackles throughout. Gastrointestinal: Soft and nontender. No distention. Musculoskeletal: No lower extremity tenderness nor edema.  Neurologic:  Normal speech and language. No gross focal neurologic deficits are appreciated.    ED Results / Procedures / Treatments   Labs (all labs ordered are listed, but only abnormal results are displayed) Labs Reviewed  CBC WITH DIFFERENTIAL/PLATELET - Abnormal; Notable for the following components:      Result Value   RBC 3.40 (*)    Hemoglobin 10.8 (*)    HCT 32.8 (*)    RDW 16.1 (*)    All other components within normal limits  COMPREHENSIVE METABOLIC PANEL WITH GFR - Abnormal; Notable for the following components:   CO2 21 (*)    Glucose, Bld 171 (*)    BUN 59 (*)    Creatinine, Ser 2.24 (*)    Calcium 8.5 (*)    Total Protein 6.0 (*)    Albumin  3.0 (*)    GFR, Estimated 20 (*)    All other components within normal limits  BRAIN NATRIURETIC PEPTIDE - Abnormal; Notable for  the following components:   B Natriuretic Peptide >4,500.0 (*)    All other components within normal limits  BLOOD GAS, VENOUS - Abnormal; Notable for the following components:   pCO2, Ven 40 (*)    All other components within normal limits  TROPONIN I (HIGH SENSITIVITY) - Abnormal; Notable for the following components:   Troponin I (High Sensitivity) 36 (*)    All other components within normal limits  TROPONIN I (HIGH SENSITIVITY)     EKG  ED ECG REPORT I, Carlin Palin, the attending physician, personally viewed and interpreted this ECG.   Date: 01/20/2024  EKG Time: 8:50  Rate: 91  Rhythm: normal sinus rhythm  Axis: Normal  Intervals:left bundle branch block  ST&T Change: None  RADIOLOGY Chest x-ray reviewed and interpreted by me with pulmonary edema, no focal infiltrate noted.  PROCEDURES:  Critical Care performed: Yes, see critical care procedure note(s)  .Critical Care  Performed by: Palin Carlin,  MD Authorized by: Palin Carlin, MD   Critical care provider statement:    Critical care time (minutes):  30   Critical care time was exclusive of:  Separately billable procedures and treating other patients and teaching time   Critical care was necessary to treat or prevent imminent or life-threatening deterioration of the following conditions:  Respiratory failure   Critical care was time spent personally by me on the following activities:  Development of treatment plan with patient or surrogate, discussions with consultants, evaluation of patient's response to treatment, examination of patient, ordering and review of laboratory studies, ordering and review of radiographic studies, ordering and performing treatments and interventions, pulse oximetry, re-evaluation of patient's condition and review of old charts   I assumed direction of critical care for this patient from another provider in my specialty: no     Care discussed with: admitting provider      MEDICATIONS ORDERED IN ED: Medications  furosemide  (LASIX ) injection 80 mg (80 mg Intravenous Given 01/20/24 1042)     IMPRESSION / MDM / ASSESSMENT AND PLAN / ED COURSE  I reviewed the triage vital signs and the nursing notes.                              89 y.o. female with past medical history of hypertension, CHF, sick sinus syndrome, atrial fibrillation on Eliquis , COPD, and CKD who presents to the ED complaining of increasing difficulty breathing over the past 2 days, especially when laying down at night.  Patient's presentation is most consistent with acute presentation with potential threat to life or bodily function.  Differential diagnosis includes, but is not limited to, CHF exacerbation, COPD exacerbation, ACS, PE, pneumonia, anemia, electrolyte abnormality, AKI.  Patient ill-appearing with increased work of breathing, currently maintaining oxygen saturations at 100% on 4 L nasal cannula.  Patient has had numerous  admissions for CHF exacerbation in the past, suspect she is fluid overloaded again today.  Chest x-ray pending at this time, minimal wheezing noted and we will hold off on additional DuoNebs.  EKG shows no evidence arrhythmia or ischemia, lab results also pending.  Chest x-ray is concerning for pulmonary edema, labs show troponin at baseline and BNP elevated consistent with CHF exacerbation.  We will diurese with IV Lasix , patient continues to breathe comfortably on 2 L nasal cannula.  She has a mild AKI with no significant electrolyte abnormality, anemia, or leukocytosis.  VBG is reassuring with no hypercapnia.  Case  discussed with hospitalist for admission.      FINAL CLINICAL IMPRESSION(S) / ED DIAGNOSES   Final diagnoses:  Acute on chronic systolic congestive heart failure (HCC)  Acute respiratory failure with hypoxia (HCC)     Rx / DC Orders   ED Discharge Orders     None        Note:  This document was prepared using Dragon voice recognition software and may include unintentional dictation errors.   Willo Dunnings, MD 01/20/24 1045

## 2024-01-20 NOTE — ED Notes (Addendum)
 Pt wants to be off oxygen per her request. Pt taken off and satting at 94% on RA.

## 2024-01-20 NOTE — ED Notes (Signed)
 Pt cleaned up after brief was found wet. New brief and bed pads applied.

## 2024-01-20 NOTE — ED Notes (Addendum)
 Dr Kandis messaged through secure chat: pts BP at 1340 was 179/79. Are you okay with that BP or is there anything you want me to give?  Dr Kandis responded: we can do prn hydral

## 2024-01-20 NOTE — ED Triage Notes (Signed)
 Pt to ED from home with SOB since last night. Pt oxygenation saturation 80% on room air for EMS. Pt has labored respirations with accessory muscle usage. A&O x4. Denies chest pain/dizziness.   Albuterol  neb x2 180/130 Hx CHF

## 2024-01-21 ENCOUNTER — Observation Stay
Admit: 2024-01-21 | Discharge: 2024-01-21 | Disposition: A | Discharge status: Home / Self Care | Attending: Internal Medicine | Admitting: Internal Medicine

## 2024-01-21 DIAGNOSIS — Z79899 Other long term (current) drug therapy: Secondary | ICD-10-CM | POA: Diagnosis not present

## 2024-01-21 DIAGNOSIS — J9600 Acute respiratory failure, unspecified whether with hypoxia or hypercapnia: Secondary | ICD-10-CM | POA: Diagnosis not present

## 2024-01-21 DIAGNOSIS — Z515 Encounter for palliative care: Secondary | ICD-10-CM | POA: Diagnosis not present

## 2024-01-21 DIAGNOSIS — Z66 Do not resuscitate: Secondary | ICD-10-CM | POA: Diagnosis present

## 2024-01-21 DIAGNOSIS — I495 Sick sinus syndrome: Secondary | ICD-10-CM

## 2024-01-21 DIAGNOSIS — I509 Heart failure, unspecified: Secondary | ICD-10-CM | POA: Diagnosis present

## 2024-01-21 DIAGNOSIS — Z7189 Other specified counseling: Secondary | ICD-10-CM | POA: Diagnosis not present

## 2024-01-21 DIAGNOSIS — I5043 Acute on chronic combined systolic (congestive) and diastolic (congestive) heart failure: Secondary | ICD-10-CM | POA: Diagnosis present

## 2024-01-21 DIAGNOSIS — Z7901 Long term (current) use of anticoagulants: Secondary | ICD-10-CM | POA: Diagnosis not present

## 2024-01-21 DIAGNOSIS — M8580 Other specified disorders of bone density and structure, unspecified site: Secondary | ICD-10-CM | POA: Diagnosis present

## 2024-01-21 DIAGNOSIS — I130 Hypertensive heart and chronic kidney disease with heart failure and stage 1 through stage 4 chronic kidney disease, or unspecified chronic kidney disease: Secondary | ICD-10-CM | POA: Diagnosis present

## 2024-01-21 DIAGNOSIS — Z8249 Family history of ischemic heart disease and other diseases of the circulatory system: Secondary | ICD-10-CM | POA: Diagnosis not present

## 2024-01-21 DIAGNOSIS — I480 Paroxysmal atrial fibrillation: Secondary | ICD-10-CM | POA: Diagnosis not present

## 2024-01-21 DIAGNOSIS — E440 Moderate protein-calorie malnutrition: Secondary | ICD-10-CM

## 2024-01-21 DIAGNOSIS — N184 Chronic kidney disease, stage 4 (severe): Secondary | ICD-10-CM

## 2024-01-21 DIAGNOSIS — I482 Chronic atrial fibrillation, unspecified: Secondary | ICD-10-CM | POA: Diagnosis present

## 2024-01-21 DIAGNOSIS — I447 Left bundle-branch block, unspecified: Secondary | ICD-10-CM | POA: Diagnosis present

## 2024-01-21 DIAGNOSIS — J449 Chronic obstructive pulmonary disease, unspecified: Secondary | ICD-10-CM | POA: Diagnosis not present

## 2024-01-21 DIAGNOSIS — J4489 Other specified chronic obstructive pulmonary disease: Secondary | ICD-10-CM | POA: Diagnosis present

## 2024-01-21 DIAGNOSIS — E119 Type 2 diabetes mellitus without complications: Secondary | ICD-10-CM | POA: Diagnosis not present

## 2024-01-21 DIAGNOSIS — I5022 Chronic systolic (congestive) heart failure: Secondary | ICD-10-CM | POA: Diagnosis not present

## 2024-01-21 DIAGNOSIS — Z888 Allergy status to other drugs, medicaments and biological substances status: Secondary | ICD-10-CM | POA: Diagnosis not present

## 2024-01-21 DIAGNOSIS — J9601 Acute respiratory failure with hypoxia: Secondary | ICD-10-CM | POA: Diagnosis present

## 2024-01-21 DIAGNOSIS — E785 Hyperlipidemia, unspecified: Secondary | ICD-10-CM | POA: Diagnosis present

## 2024-01-21 DIAGNOSIS — Z96611 Presence of right artificial shoulder joint: Secondary | ICD-10-CM | POA: Diagnosis present

## 2024-01-21 DIAGNOSIS — Z90710 Acquired absence of both cervix and uterus: Secondary | ICD-10-CM | POA: Diagnosis not present

## 2024-01-21 DIAGNOSIS — E43 Unspecified severe protein-calorie malnutrition: Secondary | ICD-10-CM | POA: Diagnosis present

## 2024-01-21 DIAGNOSIS — Z825 Family history of asthma and other chronic lower respiratory diseases: Secondary | ICD-10-CM | POA: Diagnosis not present

## 2024-01-21 DIAGNOSIS — Z950 Presence of cardiac pacemaker: Secondary | ICD-10-CM | POA: Diagnosis not present

## 2024-01-21 DIAGNOSIS — E1122 Type 2 diabetes mellitus with diabetic chronic kidney disease: Secondary | ICD-10-CM | POA: Diagnosis present

## 2024-01-21 DIAGNOSIS — I10 Essential (primary) hypertension: Secondary | ICD-10-CM

## 2024-01-21 DIAGNOSIS — Z7984 Long term (current) use of oral hypoglycemic drugs: Secondary | ICD-10-CM | POA: Diagnosis not present

## 2024-01-21 DIAGNOSIS — Z96653 Presence of artificial knee joint, bilateral: Secondary | ICD-10-CM | POA: Diagnosis present

## 2024-01-21 LAB — ECHOCARDIOGRAM COMPLETE
AR max vel: 1.25 cm2
AV Area VTI: 1.27 cm2
AV Area mean vel: 1.26 cm2
AV Mean grad: 12 mmHg
AV Peak grad: 21.7 mmHg
Ao pk vel: 2.33 m/s
Area-P 1/2: 2.88 cm2
Calc EF: 35.3 %
Height: 69 in
MV VTI: 1.64 cm2
S' Lateral: 3.8 cm
Single Plane A2C EF: 37.7 %
Single Plane A4C EF: 25.6 %
Weight: 2476.21 [oz_av]

## 2024-01-21 LAB — GLUCOSE, CAPILLARY
Glucose-Capillary: 175 mg/dL — ABNORMAL HIGH (ref 70–99)
Glucose-Capillary: 161 mg/dL — ABNORMAL HIGH (ref 70–99)

## 2024-01-21 LAB — BASIC METABOLIC PANEL WITH GFR
Anion gap: 12 (ref 5–15)
BUN: 59 mg/dL — ABNORMAL HIGH (ref 8–23)
CO2: 25 mmol/L (ref 22–32)
Calcium: 8.5 mg/dL — ABNORMAL LOW (ref 8.9–10.3)
Chloride: 103 mmol/L (ref 98–111)
Creatinine, Ser: 2.16 mg/dL — ABNORMAL HIGH (ref 0.44–1.00)
GFR, Estimated: 21 mL/min — ABNORMAL LOW (ref >60)
Glucose, Bld: 125 mg/dL — ABNORMAL HIGH (ref 70–99)
Potassium: 3.6 mmol/L (ref 3.5–5.1)
Sodium: 140 mmol/L (ref 135–145)

## 2024-01-21 LAB — CBC
HCT: 31.4 % — ABNORMAL LOW (ref 36.0–46.0)
Hemoglobin: 10.6 g/dL — ABNORMAL LOW (ref 12.0–15.0)
MCH: 31.6 pg (ref 26.0–34.0)
MCHC: 33.8 g/dL (ref 30.0–36.0)
MCV: 93.7 fL (ref 80.0–100.0)
Platelets: 254 10*3/uL (ref 150–400)
RBC: 3.35 MIL/uL — ABNORMAL LOW (ref 3.87–5.11)
RDW: 16.1 % — ABNORMAL HIGH (ref 11.5–15.5)
WBC: 6.3 10*3/uL (ref 4.0–10.5)
nRBC: 0 % (ref 0.0–0.2)

## 2024-01-21 MED ORDER — INSULIN ASPART 100 UNIT/ML IJ SOLN: 0.0000 [IU] | Freq: Every day | INTRAMUSCULAR | Status: DC

## 2024-01-21 MED ORDER — INSULIN ASPART 100 UNIT/ML IJ SOLN
0.0000 [IU] | Freq: Three times a day (TID) | INTRAMUSCULAR | Status: DC
  Administered 2024-01-21 – 2024-01-22 (×2): 1 [IU] via SUBCUTANEOUS
  Filled 2024-01-21 (×2): qty 1

## 2024-01-21 MED ORDER — ONDANSETRON 4 MG PO TBDP
4.0000 mg | ORAL_TABLET | Freq: Three times a day (TID) | ORAL | Status: DC | PRN
  Administered 2024-01-21: 4 mg via ORAL
  Filled 2024-01-21 (×2): qty 1

## 2024-01-21 MED ORDER — ACETAMINOPHEN 325 MG PO TABS
650.0000 mg | ORAL_TABLET | Freq: Four times a day (QID) | ORAL | Status: DC | PRN
  Administered 2024-01-21: 650 mg via ORAL
  Filled 2024-01-21 (×2): qty 2

## 2024-01-21 MED ORDER — CARVEDILOL 6.25 MG PO TABS
6.2500 mg | ORAL_TABLET | Freq: Two times a day (BID) | ORAL | Status: DC
  Administered 2024-01-21 – 2024-01-23 (×4): 6.25 mg via ORAL
  Filled 2024-01-21 (×4): qty 1

## 2024-01-21 MED ORDER — POTASSIUM CHLORIDE CRYS ER 20 MEQ PO TBCR
40.0000 meq | EXTENDED_RELEASE_TABLET | Freq: Once | ORAL | Status: AC
  Administered 2024-01-21: 40 meq via ORAL
  Filled 2024-01-21: qty 2

## 2024-01-21 MED ORDER — FUROSEMIDE 10 MG/ML IJ SOLN
40.0000 mg | Freq: Two times a day (BID) | INTRAMUSCULAR | Status: DC
  Administered 2024-01-21 – 2024-01-22 (×4): 40 mg via INTRAVENOUS
  Filled 2024-01-21 (×4): qty 4

## 2024-01-21 NOTE — Progress Notes (Signed)
*  PRELIMINARY RESULTS* Echocardiogram 2D Echocardiogram has been performed.  Floydene Harder 01/21/2024, 11:38 AM

## 2024-01-21 NOTE — Assessment & Plan Note (Signed)
 No concern of exacerbation at this time. - Bronchodilator as needed

## 2024-01-21 NOTE — Plan of Care (Signed)
  Problem: Education: Goal: Knowledge of General Education information will improve Description: Including pain rating scale, medication(s)/side effects and non-pharmacologic comfort measures Outcome: Progressing   Problem: Clinical Measurements: Goal: Will remain free from infection Outcome: Progressing Goal: Respiratory complications will improve Outcome: Progressing   Problem: Activity: Goal: Risk for activity intolerance will decrease Outcome: Progressing   Problem: Nutrition: Goal: Adequate nutrition will be maintained Outcome: Progressing   Problem: Elimination: Goal: Will not experience complications related to bowel motility Outcome: Progressing Goal: Will not experience complications related to urinary retention Outcome: Progressing   Problem: Safety: Goal: Ability to remain free from injury will improve Outcome: Progressing   Problem: Health Behavior/Discharge Planning: Goal: Ability to manage health-related needs will improve Outcome: Not Progressing   Problem: Pain Managment: Goal: General experience of comfort will improve and/or be controlled Outcome: Not Progressing   Problem: Skin Integrity: Goal: Risk for impaired skin integrity will decrease Outcome: Not Progressing

## 2024-01-21 NOTE — Consult Note (Signed)
 Consultation Note Date: 01/21/2024   Patient Name: Leslie Parker  DOB: [birth year]  MRN: [id number]  Age / Sex: 89 y.o., female  PCP: Sherial Bail, MD Referring Physician: Caleen Qualia, MD  Reason for Consultation: Establishing goals of care  HPI/Patient Profile: Per H&P Leslie Parker is a 89 y.o. female with medical history significant for hfref, a-fib, ckd 4, copd, dm, htn, bradycardia s/p pacemaker, frequent hospitalizations, who presents with  shortness of breath   Clinical Assessment and Goals of Care: Notes and labs reviewed.  Patient is known to our service from previous admissions.  Into see patient.  She is currently sitting in bed at this time, no family at bedside.  She discusses that her son who had cancer died since her last admission.  She discusses living at home in her apartment by herself.  She discusses doing most things that need to be done, and family coming to help with things that she is unable to do.  She discusses monitoring her health at home, and knowing when she is fluid overloaded.  She states she has Trelegy at home, but does not use it daily as she has not been experiencing issues with her breathing.  She confirms using wheelchair when she is out of the house.  She discusses that briefly home hospice was discussed, but two physicians both stated that she was not a candidate for home hospice.  We discussed her diagnoses, prognosis, GOC, EOL wishes disposition and options.  Created space and opportunity for patient  to explore thoughts and feelings regarding current medical information.   A detailed discussion was had today regarding advanced directives.  Concepts specific to code status, artifical feeding and hydration, IV antibiotics and rehospitalization were discussed.  The difference between an aggressive medical intervention path and a comfort care path was discussed.   Values and goals of care important to patient and family were attempted to be elicited.  Discussed limitations of medical interventions to prolong quality of life in some situations and discussed the concept of human mortality.  With much conversation, she states if it is her time to go, she is ready however she would like to continue to treat the treatable to live longer.  She confirms DNR and DNI status.  She states if she is fluid overloaded or needs medication, she would desire to call 911 to come to the hospital to have life-prolonging care (without ventilator support and CPR).       SUMMARY OF RECOMMENDATIONS   DNR/DNI.  Continue to treat the treatable.     Primary Diagnoses: Present on Admission:  Acute on chronic combined systolic and diastolic CHF (congestive heart failure) (HCC)  Chronic a-fib (HCC)  CKD (chronic kidney disease) stage 4, GFR 15-29 ml/min (HCC)  COPD (chronic obstructive pulmonary disease) (HCC)  Essential hypertension  Malnutrition of moderate degree  Acute hypoxic respiratory failure (HCC)  Sick sinus syndrome (HCC)   I have reviewed the medical record, interviewed the patient and family, and examined the patient. The following  aspects are pertinent.  Past Medical History:  Diagnosis Date   Acute kidney injury superimposed on CKD (HCC) 11/24/2022   Asthma    Atrial fibrillation with RVR (HCC)    Cancer (HCC)    Basal Cell   CHF (congestive heart failure) (HCC)    Closed left hip fracture (HCC) 01/25/2017   Closed right hip fracture (HCC) 02/13/2023   Diabetes mellitus without complication (HCC)    Femur fracture, left (HCC) 02/03/2015   Heart murmur    Hypertension    Impetigo    Osteoarthritis    Osteopenia    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   Rapid atrial fibrillation, new onset(HCC) 06/05/2022   Vomiting    can not due to surgery   Wears dentures    full upper and lower   Social History   Socioeconomic History   Marital  status: Divorced    Spouse name: Not on file   Number of children: 4   Years of education: Not on file   Highest education level: 12th grade  Occupational History   Occupation: Retitred/Disability  Tobacco Use   Smoking status: Never   Smokeless tobacco: Never  Vaping Use   Vaping status: Never Used  Substance and Sexual Activity   Alcohol use: No    Alcohol/week: 0.0 standard drinks of alcohol   Drug use: No   Sexual activity: Not Currently  Other Topics Concern   Not on file  Social History Narrative   Not on file   Social Drivers of Health   Financial Resource Strain: Low Risk  (01/13/2024)   Received from [facility]   Overall Financial Resource Strain (CARDIA)    Difficulty of Paying Living Expenses: Not hard at all  Food Insecurity: No Food Insecurity (01/20/2024)   Hunger Vital Sign    Worried About Running Out of Food in the Last Year: Never true    Ran Out of Food in the Last Year: Never true  Transportation Needs: No Transportation Needs (01/20/2024)   PRAPARE - Administrator, Civil Service (Medical): No    Lack of Transportation (Non-Medical): No  Physical Activity: Not on file  Stress: Not on file  Social Connections: Moderately Isolated (01/20/2024)   Social Connection and Isolation Panel    Frequency of Communication with Friends and Family: More than three times a week    Frequency of Social Gatherings with Friends and Family: Three times a week    Attends Religious Services: 1 to 4 times per year    Active Member of Clubs or Organizations: No    Attends Banker Meetings: Never    Marital Status: Divorced   Family History  Problem Relation Age of Onset   Heart failure Mother    Hypertension Mother    Emphysema Father    Scheduled Meds:  amiodarone   200 mg Oral Daily   apixaban   2.5 mg Oral BID   budesonide -glycopyrrolate -formoterol   2 puff Inhalation BID   carvedilol   6.25 mg Oral BID WC   colestipol   2 g  Oral BID   empagliflozin   10 mg Oral Daily   furosemide   40 mg Intravenous BID   insulin  aspart  0-5 Units Subcutaneous QHS   insulin  aspart  0-6 Units Subcutaneous TID WC   isosorbide  mononitrate  30 mg Oral Daily   potassium chloride   40 mEq Oral Once   tamsulosin  0.4 mg Oral Daily   Continuous Infusions: PRN Meds:.acetaminophen , hydrALAZINE , hyoscyamine, ondansetron   Medications Prior to Admission:  Prior to Admission medications   Medication Sig Start Date End Date Taking? Authorizing Provider  acetaminophen  (TYLENOL ) 500 MG tablet Take 2 tablets (1,000 mg total) by mouth 3 (three) times daily. 11/29/23  Yes Maree Hue, MD  albuterol  (VENTOLIN  HFA) 108 (90 Base) MCG/ACT inhaler Inhale 2 puffs into the lungs every 6 (six) hours as needed. 08/04/19  Yes [provider]  alum & mag hydroxide-simeth (MAALOX/MYLANTA) 200-200-20 MG/5ML suspension Take 30 mLs by mouth every 6 (six) hours as needed for indigestion or heartburn. 11/29/23  Yes Maree Hue, MD  amiodarone  (PACERONE ) 200 MG tablet Take 1 tablet by mouth daily. 08/26/23  Yes [provider]  apixaban  (ELIQUIS ) 2.5 MG TABS tablet Take 1 tablet (2.5 mg total) by mouth 2 (two) times daily. 06/07/22  Yes Sreenath, Sudheer B, MD  ascorbic acid  (VITAMIN C ) 250 MG tablet Take 250 mg by mouth daily.   Yes [provider]  Cholecalciferol  (VITAMIN D -1000 MAX ST) 25 MCG (1000 UT) tablet Take 1,000 Units by mouth daily.   Yes [provider]  colestipol  (COLESTID ) 1 g tablet Take 2 g by mouth.  Take 2 g by mouth 2 (two) times daily   Yes [provider]  Cyanocobalamin  (B-12) 2500 MCG TABS Take 2,500 mcg by mouth daily.   Yes [provider]  isosorbide  mononitrate (IMDUR ) 30 MG 24 hr tablet Take 30 mg by mouth.  Take 30 mg by mouth at bedtime   Yes [provider]  JARDIANCE  10 MG TABS tablet  11/26/23  Yes [provider]  lisinopril  (ZESTRIL ) 5 MG tablet Take 5 mg by mouth  daily.   Yes [provider]  Multiple Vitamin (MULTIVITAMIN WITH MINERALS) TABS tablet Take 1 tablet by mouth daily. One-A-Day Women's Vitamin   Yes [provider]  ondansetron  (ZOFRAN ) 4 MG tablet Take 1 tablet (4 mg total) by mouth every 6 (six) hours as needed for nausea. 11/17/23  Yes Wieting, Richard, MD  polyethylene glycol (MIRALAX  / GLYCOLAX ) 17 g packet Take 17 g by mouth daily as needed. 11/29/23  Yes Maree Hue, MD  senna-docusate (SENOKOT-S) 8.6-50 MG tablet Take 2 tablets by mouth at bedtime as needed for mild constipation. 11/29/23  Yes Maree Hue, MD  torsemide  (DEMADEX ) 20 MG tablet Take 1 tablet (20 mg total) by mouth 2 (two) times daily. 12/16/23  Yes Jens Durand, MD  TRELEGY ELLIPTA 100-62.5-25 MCG/ACT AEPB Inhale 1 puff into the lungs daily. 08/03/22  Yes [provider]  ipratropium-albuterol  (DUONEB) 0.5-2.5 (3) MG/3ML SOLN Inhale 3 mLs into the lungs every 6 (six) hours as needed. Patient not taking: Reported on 01/20/2024 06/01/22   [provider]  oxyCODONE  (OXY IR/ROXICODONE ) 5 MG immediate release tablet Take 2.5 mg by mouth every 6 (six) hours as needed for severe pain (pain score 7-10). Patient not taking: Reported on 01/20/2024    [provider]   Allergies  Allergen Reactions   Baclofen Other (See Comments)    Elevated Cr   Simvastatin Other (See Comments)    Pt denies  Elevated LFTs with simva 40mg , stopped and resolved (see MD note 11/10/13)   Review of Systems  All other systems reviewed and are negative.   Physical Exam Pulmonary:     Effort: Pulmonary effort is normal.   Skin:    General: Skin is warm and dry.   Neurological:     Mental Status: She is alert.     Vital  Signs: BP (!) 114/52 (BP Location: Left Arm)   Pulse 68   Temp 98 F (36.7 C)   Resp 14   Ht 5' 9 (1.753 m)   Wt 70.2 kg   SpO2 97%   BMI 22.85 kg/m  Pain Scale: 0-10   Pain Score: Asleep   SpO2: SpO2: 97 % O2  Device:SpO2: 97 % O2 Flow Rate: .O2 Flow Rate (L/min): 2 L/min  IO: Intake/output summary:  Intake/Output Summary (Last 24 hours) at 01/21/2024 1516 Last data filed at 01/21/2024 1427 Gross per 24 hour  Intake 840 ml  Output 1800 ml  Net -960 ml    LBM: Last BM Date : 01/19/24 Baseline Weight: Weight: 68.9 kg Most recent weight: Weight: 70.2 kg       Signed by: [name] Lewis, NP   Please contact Palliative Medicine Team phone at [phone] for questions and concerns.  For individual provider: See Tracey

## 2024-01-21 NOTE — Progress Notes (Signed)
 Progress Note   Patient: Leslie Parker FMW:[id number] DOB: September 23, 1932 DOA: 01/20/2024     1 DOS: the patient was seen and examined on 01/21/2024   Brief hospital course:  Leslie Parker is a 89 y.o. female with medical history significant for HFrEF, A-fib, CKD stage IV, COPD, diabetes, hypertension, bradycardia s/p pacemaker came to ED with complaint of shortness of breath.  Patient with multiple recent hospitalizations for similar reason. Patient also has orthopnea, EMS found her hypoxic in low 80s. No cough, fever or chest pain.  No lower extremity edema. Patient has 12 hospitalizations this year-palliative care was consulted but she declined hospice.  On presentation's vitals stable on 2 L of oxygen, labs with increasing creatinine to 2.24 with baseline seems to be around 1.9-2.0, hemoglobin of 10.8 which is around her baseline, BNP more than 4500 which she had it for the past many months, troponin 36, VBG with CO2 of 40 otherwise normal. Chest x-ray with cardiomegaly and vascular congestion.  Patient was given IV Lasix .  Patient with multiple hospitalizations and advanced heart failure.  Palliative care was again consulted.  7/1: Vitals stable on 2 L of oxygen, small improvement in renal function with creatinine to 2.16-starting on regular diuresis. Palliative care and cardiology was consulted as patient continued to have significantly elevated BNP and advanced heart failure.  Unable to take care of herself and does not want to go to a facility.  Assessment and Plan: Acute on chronic combined systolic and diastolic CHF (congestive heart failure) (HCC) Patient with EF of 25 to 30% according to the prior echo.  BNP remained elevated above 4500 for a while. Cardiology was consulted -Continue with IV diuresis -Daily weight and BMP -Strict intake and output  Acute hypoxic respiratory failure (HCC) Patient was currently on 2 L of oxygen.  Likely due to CHF exacerbation.  No  wheezing. -Continue with supplemental oxygen -Wean as tolerated  Chronic a-fib (HCC) Rate controlled. -Continue home amiodarone  and Eliquis   CKD (chronic kidney disease) stage 4, GFR 15-29 ml/min (HCC) Renal function currently stable and within baseline. - Monitor renal function while she is being diuresed -Avoid nephrotoxins  Essential hypertension Blood pressure within goal. -Home lisinopril  is being held while she is being diuresed  Sick sinus syndrome (HCC) S/p pacemaker in place  Diabetes mellitus without complication (HCC) Continue home Jardiance  Sensitive SSI  COPD (chronic obstructive pulmonary disease) (HCC) No concern of exacerbation at this time. - Bronchodilator as needed  Malnutrition of moderate degree - Dietitian consult   Subjective: Patient was complaining of some nausea and headache when seen today.  Per patient still little short of breath but better than before.  She lives alone and does not want to go to any facility.  Physical Exam: Vitals:   01/20/24 2253 01/21/24 0336 01/21/24 0805 01/21/24 1155  BP: (!) 174/75 (!) 144/66 (!) 150/64 (!) 114/52  Pulse: 80 73 71 68  Resp: 18 18 20 14   Temp: 98 F (36.7 C) 97.8 F (36.6 C) 97.6 F (36.4 C) 98 F (36.7 C)  TempSrc:      SpO2: 96% 97% 99% 97%  Weight: 70.2 kg 70.2 kg    Height:       General.  Frail elderly lady, in no acute distress. Pulmonary.  Lungs clear bilaterally, normal respiratory effort. CV.  Regular rate and rhythm, no JVD, rub or murmur. Abdomen.  Soft, nontender, nondistended, BS positive. CNS.  Alert and oriented .  No focal neurologic deficit. Extremities.  No edema,  pulses intact and symmetrical.  Data Reviewed: Prior data reviewed  Family Communication: Called granddaughter with no response  Disposition: Status is: Inpatient Remains inpatient appropriate because: Severity of illness  Planned Discharge Destination: To be determined  DVT prophylaxis.  Eliquis  Time  spent: 50 minutes  This record has been created using Conservation officer, historic buildings. Errors have been sought and corrected,but may not always be located. Such creation errors do not reflect on the standard of care.   Author: Amaryllis Dare, MD 01/21/2024 3:14 PM  For on call review [email or web address].

## 2024-01-21 NOTE — Hospital Course (Addendum)
 Leslie Parker is a 89 y.o. female with medical history significant for HFrEF, A-fib, CKD stage IV, COPD, diabetes, hypertension, bradycardia s/p pacemaker came to ED with complaint of shortness of breath.  Patient with multiple recent hospitalizations for similar reason. Patient also has orthopnea, EMS found her hypoxic in low 80s. No cough, fever or chest pain.  No lower extremity edema. Patient has 12 hospitalizations this year-palliative care was consulted but she declined hospice.  On presentation's vitals stable on 2 L of oxygen, labs with increasing creatinine to 2.24 with baseline seems to be around 1.9-2.0, hemoglobin of 10.8 which is around her baseline, BNP more than 4500 which she had it for the past many months, troponin 36, VBG with CO2 of 40 otherwise normal. Chest x-ray with cardiomegaly and vascular congestion.  Patient was given IV Lasix .  Patient with multiple hospitalizations and advanced heart failure.  Palliative care was again consulted.  7/1: Vitals stable on 2 L of oxygen, small improvement in renal function with creatinine to 2.16-starting on regular diuresis. Palliative care and cardiology was consulted as patient continued to have significantly elevated BNP and advanced heart failure.  Unable to take care of herself and does not want to go to a facility.

## 2024-01-21 NOTE — Assessment & Plan Note (Signed)
Rate controlled.  Continue home amiodarone and Eliquis.

## 2024-01-21 NOTE — Consult Note (Signed)
 [facility] CLINIC CARDIOLOGY CONSULT NOTE       Patient ID: Leslie Parker MRN: [id number] DOB/AGE: 28-May-1933 89 y.o.  Admit date: 01/20/2024 Referring Physician Dr. Caleen Primary Physician Sherial Bail, MD Primary Cardiologist Dr. Florencio Reason for Consultation AoCHFrEF (BNP > 4500)  HPI: Leslie Parker is a 89 y.o. female  with a past medical history of chronic HFrEF (EF 25-30% - 07/2023), paroxymal atrial fibrillation (on Eliquis , PO amio), SSS s/p [employer] dual chamber PPM (11/15/2022), hypertension,  type 2 diabetes mellitus, severe COPD stage 3 by GOLD classification, CKG stage 4 who presented to the ED on 01/20/2024 for worsening dyspnea and orthopnea. Of note patient is frequently hospitalized for acute on chronic heart failure, most recently 11/2023. Cardiology was consulted for further evaluation for acute on chronic HFrEF with BNP > 4500.  Patient presented to the ED with worsening dyspnea and orthopnea. Work up in the ED notable for Na 137, k 3.8, CO2 21, Cr 2.24, albumin  3.0, total protein 6.0, Hgb 10.8, plts 261. CXR with pulmonary vascular congestion. BNP elevated at > 4500. Trops minimally elevated and flat 36 > 46. EKG in ED with sinus rhythm vs. Atrial paced with LBBB, rate 91 bpm. Patient received 1x IV lasix  60 mg, and 1x IV 80 mg with UOP of 1.8L yesterday.   At the time of my evaluation this afternoon, patient was resting comfortably in hospital bed at a slightly incline. We discussed patients sxs in further detail. Patient states she had worsening SOB and orthopnea for past 2 days. Patient denies chest pain. Patient reports improvement to SOB since admission s/p IV lasix  and good UOP.   Review of systems complete and found to be negative unless listed above    Past Medical History:  Diagnosis Date   Acute kidney injury superimposed on CKD (HCC) 11/24/2022   Asthma    Atrial fibrillation with RVR (HCC)    Cancer (HCC)    Basal Cell   CHF (congestive heart  failure) (HCC)    Closed left hip fracture (HCC) 01/25/2017   Closed right hip fracture (HCC) 02/13/2023   Diabetes mellitus without complication (HCC)    Femur fracture, left (HCC) 02/03/2015   Heart murmur    Hypertension    Impetigo    Osteoarthritis    Osteopenia    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   Rapid atrial fibrillation, new onset(HCC) 06/05/2022   Vomiting    can not due to surgery   Wears dentures    full upper and lower    Past Surgical History:  Procedure Laterality Date   ABDOMINAL HYSTERECTOMY     BACK SURGERY     BLADDER SURGERY     mesh   CARPAL TUNNEL RELEASE Bilateral    CATARACT EXTRACTION Right 2017   CATARACT EXTRACTION W/PHACO Left 02/06/2016   Procedure: CATARACT EXTRACTION PHACO AND INTRAOCULAR LENS PLACEMENT (IOC) left eye;  Surgeon: Donzell Arlyce Budd, MD;  Location: [facility];  Service: Ophthalmology;  Laterality: Left;  DIABETIC LEFT Cannot arrive before 9:30   CERVICAL DISC SURGERY     CHOLECYSTECTOMY     EYE SURGERY Bilateral    Cataract Extraction with IOL   FEMUR IM NAIL Left 02/04/2015   Procedure: INTRAMEDULLARY (IM) NAIL FEMORAL;  Surgeon: Franky Cranker, MD;  Location: [facility];  Service: Orthopedics;  Laterality: Left;   HARDWARE REMOVAL Left 01/17/2017   Procedure: HARDWARE REMOVAL;  Surgeon: Cranker Franky, MD;  Location: [facility];  Service: Orthopedics;  Laterality:  Left;   HERNIA REPAIR  2014   esophageal and gastric mesh. patient unable to throw up d/t mesh   HIP ARTHROPLASTY Left 01/26/2017   Procedure: ARTHROPLASTY BIPOLAR HIP (HEMIARTHROPLASTY) removal hardware left hip;  Surgeon: Cleotilde Barrio, MD;  Location: [facility];  Service: Orthopedics;  Laterality: Left;   INTRAMEDULLARY (IM) NAIL INTERTROCHANTERIC Right 02/13/2023   Procedure: INTRAMEDULLARY (IM) NAIL INTERTROCHANTERIC;  Surgeon: Edie Norleen PARAS, MD;  Location: [facility];  Service: Orthopedics;  Laterality: Right;   JOINT REPLACEMENT Bilateral    knees    PACEMAKER IMPLANT N/A 11/15/2022   Procedure: PACEMAKER IMPLANT;  Surgeon: Ammon Blunt, MD;  Location: [facility];  Service: Cardiovascular;  Laterality: N/A;   PACEMAKER IMPLANT N/A 11/28/2022   Procedure: PACEMAKER IMPLANT;  Surgeon: Ammon Blunt, MD;  Location: [facility];  Service: Cardiovascular;  Laterality: N/A;  Lead reposition   REPLACEMENT TOTAL KNEE BILATERAL Bilateral [id number]   SHOULDER ARTHROSCOPY WITH OPEN ROTATOR CUFF REPAIR AND DISTAL CLAVICLE ACROMINECTOMY Left 10/25/2016   Procedure: SHOULDER ARTHROSCOPY WITH OPEN ROTATOR CUFF REPAIR AND DISTAL CLAVICLE ACROMINECTOMY;  Surgeon: Franky Cranker, MD;  Location: [facility];  Service: Orthopedics;  Laterality: Left;   THYROID  SURGERY     goiter removed   TOTAL HIP REVISION Left 12/02/2017   Procedure: TOTAL HIP REVISION;  Surgeon: Leora Lynwood SAUNDERS, MD;  Location: [facility];  Service: Orthopedics;  Laterality: Left;   TOTAL SHOULDER REPLACEMENT Right 2012    Medications Prior to Admission  Medication Sig Dispense Refill Last Dose/Taking   acetaminophen  (TYLENOL ) 500 MG tablet Take 2 tablets (1,000 mg total) by mouth 3 (three) times daily.   01/19/2024   albuterol  (VENTOLIN  HFA) 108 (90 Base) MCG/ACT inhaler Inhale 2 puffs into the lungs every 6 (six) hours as needed.   Unknown   alum & mag hydroxide-simeth (MAALOX/MYLANTA) 200-200-20 MG/5ML suspension Take 30 mLs by mouth every 6 (six) hours as needed for indigestion or heartburn.   Unknown   amiodarone  (PACERONE ) 200 MG tablet Take 1 tablet by mouth daily.   01/19/2024 Noon   apixaban  (ELIQUIS ) 2.5 MG TABS tablet Take 1 tablet (2.5 mg total) by mouth 2 (two) times daily. 60 tablet 1 01/19/2024 Noon   ascorbic acid  (VITAMIN C ) 250 MG tablet Take 250 mg by mouth daily.   01/19/2024 Noon   Cholecalciferol  (VITAMIN D -1000 MAX ST) 25 MCG (1000 UT) tablet Take 1,000 Units by mouth daily.   01/19/2024 Noon   colestipol  (COLESTID ) 1 g tablet Take 2 g by mouth.   Take 2 g by mouth 2 (two) times daily   01/19/2024 Noon   Cyanocobalamin  (B-12) 2500 MCG TABS Take 2,500 mcg by mouth daily.   01/19/2024   isosorbide  mononitrate (IMDUR ) 30 MG 24 hr tablet Take 30 mg by mouth.  Take 30 mg by mouth at bedtime   01/19/2024 Evening   JARDIANCE  10 MG TABS tablet    Past Week   lisinopril  (ZESTRIL ) 5 MG tablet Take 5 mg by mouth daily.   01/19/2024 Noon   Multiple Vitamin (MULTIVITAMIN WITH MINERALS) TABS tablet Take 1 tablet by mouth daily. One-A-Day Women's Vitamin   01/19/2024 Noon   ondansetron  (ZOFRAN ) 4 MG tablet Take 1 tablet (4 mg total) by mouth every 6 (six) hours as needed for nausea. 20 tablet 0 Unknown   polyethylene glycol (MIRALAX  / GLYCOLAX ) 17 g packet Take 17 g by mouth daily as needed.   Unknown   senna-docusate (SENOKOT-S) 8.6-50 MG tablet Take 2  tablets by mouth at bedtime as needed for mild constipation.   Unknown   torsemide  (DEMADEX ) 20 MG tablet Take 1 tablet (20 mg total) by mouth 2 (two) times daily.   01/19/2024 Noon   TRELEGY ELLIPTA 100-62.5-25 MCG/ACT AEPB Inhale 1 puff into the lungs daily.   01/20/2024 Morning   ipratropium-albuterol  (DUONEB) 0.5-2.5 (3) MG/3ML SOLN Inhale 3 mLs into the lungs every 6 (six) hours as needed. (Patient not taking: Reported on 01/20/2024)   Not Taking   oxyCODONE  (OXY IR/ROXICODONE ) 5 MG immediate release tablet Take 2.5 mg by mouth every 6 (six) hours as needed for severe pain (pain score 7-10). (Patient not taking: Reported on 01/20/2024)   Not Taking   Social History   Socioeconomic History   Marital status: Divorced    Spouse name: Not on file   Number of children: 4   Years of education: Not on file   Highest education level: 12th grade  Occupational History   Occupation: Retitred/Disability  Tobacco Use   Smoking status: Never   Smokeless tobacco: Never  Vaping Use   Vaping status: Never Used  Substance and Sexual Activity   Alcohol use: No    Alcohol/week: 0.0 standard drinks of alcohol   Drug  use: No   Sexual activity: Not Currently  Other Topics Concern   Not on file  Social History Narrative   Not on file   Social Drivers of Health   Financial Resource Strain: Low Risk  (01/13/2024)   Received from [facility]   Overall Financial Resource Strain (CARDIA)    Difficulty of Paying Living Expenses: Not hard at all  Food Insecurity: No Food Insecurity (01/20/2024)   Hunger Vital Sign    Worried About Running Out of Food in the Last Year: Never true    Ran Out of Food in the Last Year: Never true  Transportation Needs: No Transportation Needs (01/20/2024)   PRAPARE - Administrator, Civil Service (Medical): No    Lack of Transportation (Non-Medical): No  Physical Activity: Not on file  Stress: Not on file  Social Connections: Moderately Isolated (01/20/2024)   Social Connection and Isolation Panel    Frequency of Communication with Friends and Family: More than three times a week    Frequency of Social Gatherings with Friends and Family: Three times a week    Attends Religious Services: 1 to 4 times per year    Active Member of Clubs or Organizations: No    Attends Banker Meetings: Never    Marital Status: Divorced  Catering manager Violence: Not At Risk (01/20/2024)   Humiliation, Afraid, Rape, and Kick questionnaire    Fear of Current or Ex-Partner: No    Emotionally Abused: No    Physically Abused: No    Sexually Abused: No    Family History  Problem Relation Age of Onset   Heart failure Mother    Hypertension Mother    Emphysema Father      Vitals:   01/20/24 2253 01/21/24 0336 01/21/24 0805 01/21/24 1155  BP: (!) 174/75 (!) 144/66 (!) 150/64 (!) 114/52  Pulse: 80 73 71 68  Resp: 18 18 20 14   Temp: 98 F (36.7 C) 97.8 F (36.6 C) 97.6 F (36.4 C) 98 F (36.7 C)  TempSrc:      SpO2: 96% 97% 99% 97%  Weight: 70.2 kg 70.2 kg    Height:        PHYSICAL EXAM General:  Chronically ill appearing female, well  nourished, in no acute distress. HEENT: Normocephalic and atraumatic. Neck: No JVD.   Lungs: Normal respiratory effort on 2L. Diminished bilaterally. Heart: HRRR. Normal S1 and S2 without gallops or murmurs.  Abdomen: Non-distended appearing.  Msk: Normal strength and tone for age. Extremities: Warm and well perfused. No clubbing, cyanosis, edema.  Neuro: Alert and oriented X 3. Psych: Answers questions appropriately.   Labs: Basic Metabolic Panel: Recent Labs    01/20/24 0917 01/21/24 0250  NA 137 140  K 3.9 3.6  CL 104 103  CO2 21* 25  GLUCOSE 171* 125*  BUN 59* 59*  CREATININE 2.24* 2.16*  CALCIUM 8.5* 8.5*   Liver Function Tests: Recent Labs    01/20/24 0917  AST 34  ALT 27  ALKPHOS 99  BILITOT 1.0  PROT 6.0*  ALBUMIN  3.0*   No results for input(s): LIPASE, AMYLASE in the last 72 hours. CBC: Recent Labs    01/20/24 0917 01/21/24 0250  WBC 5.7 6.3  NEUTROABS 3.8  --   HGB 10.8* 10.6*  HCT 32.8* 31.4*  MCV 96.5 93.7  PLT 261 254   Cardiac Enzymes: Recent Labs    01/20/24 0917 01/20/24 1051  TROPONINIHS 36* 46*   BNP: Recent Labs    01/20/24 0917  BNP >4,500.0*   D-Dimer: No results for input(s): DDIMER in the last 72 hours. Hemoglobin A1C: No results for input(s): HGBA1C in the last 72 hours. Fasting Lipid Panel: No results for input(s): CHOL, HDL, LDLCALC, TRIG, CHOLHDL, LDLDIRECT in the last 72 hours. Thyroid  Function Tests: No results for input(s): TSH, T4TOTAL, T3FREE, THYROIDAB in the last 72 hours.  Invalid input(s): FREET3 Anemia Panel: No results for input(s): VITAMINB12, FOLATE, FERRITIN, TIBC, IRON, RETICCTPCT in the last 72 hours.   Radiology: ECHOCARDIOGRAM COMPLETE Result Date: 01/21/2024    ECHOCARDIOGRAM REPORT   Patient Name:   Leslie Parker [place] Date of Exam: 01/21/2024 Medical Rec #:  [id number]     Height:       69.0 in Accession #:    [id number]    Weight:       154.8 lb Date of Birth:   [birth year]      BSA:          1.853 m Patient Age:    89 years      BP:           130/64 mmHg Patient Gender: F             HR:           71 bpm. Exam Location:  [facility] Procedure: 2D Echo, Cardiac Doppler, Color Doppler and Strain Analysis (Both            Spectral and Color Flow Doppler were utilized during procedure). Indications:     Abnormal ECG R94.31  History:         Patient has prior history of Echocardiogram examinations, most                  recent 08/09/2023. CHF, Pacemaker; Risk Factors:Hypertension and                  Diabetes.  Sonographer:     Christopher Furnace Referring Phys:  [id number] SUMAYYA AMIN Diagnosing Phys: Keller Paterson  Sonographer Comments: Global longitudinal strain was attempted. IMPRESSIONS  1. Left ventricular ejection fraction, by estimation, is 25 to 30%. The left ventricle has severely decreased function. The left ventricle demonstrates regional wall motion  abnormalities (see scoring diagram/findings for description). There is mild left  ventricular hypertrophy. Left ventricular diastolic parameters are consistent with Grade II diastolic dysfunction (pseudonormalization).  2. Right ventricular systolic function is normal. The right ventricular size is normal.  3. Left atrial size was mildly dilated.  4. Mild mitral valve regurgitation.  5. The aortic valve is tricuspid. Aortic valve regurgitation is not visualized. Mild aortic valve stenosis. FINDINGS  Left Ventricle: Left ventricular ejection fraction, by estimation, is 25 to 30%. The left ventricle has severely decreased function. The left ventricle demonstrates regional wall motion abnormalities. The left ventricular internal cavity size was normal  in size. There is mild left ventricular hypertrophy. Left ventricular diastolic parameters are consistent with Grade II diastolic dysfunction (pseudonormalization).  LV Wall Scoring: The mid and distal anterior wall, mid and distal lateral wall, mid and distal anterior septum, entire apex, mid  and distal inferior wall, mid anterolateral segment, and mid inferoseptal segment are hypokinetic. The basal anteroseptal segment, basal inferolateral segment, basal anterolateral segment, basal anterior segment, basal inferior segment, and basal inferoseptal segment are normal. Right Ventricle: The right ventricular size is normal. No increase in right ventricular wall thickness. Right ventricular systolic function is normal. Left Atrium: Left atrial size was mildly dilated. Right Atrium: Right atrial size was normal in size. Pericardium: There is no evidence of pericardial effusion. Mitral Valve: There is moderate thickening of the mitral valve leaflet(s). Mild mitral annular calcification. Mild mitral valve regurgitation. MV peak gradient, 8.8 mmHg. The mean mitral valve gradient is 4.0 mmHg. Tricuspid Valve: The tricuspid valve is normal in structure. Tricuspid valve regurgitation is mild. Aortic Valve: The aortic valve is tricuspid. Aortic valve regurgitation is not visualized. Mild aortic stenosis is present. Aortic valve mean gradient measures 12.0 mmHg. Aortic valve peak gradient measures 21.7 mmHg. Aortic valve area, by VTI measures 1.27 cm. Pulmonic Valve: The pulmonic valve was not well visualized. Pulmonic valve regurgitation is not visualized. Aorta: The aortic root is normal in size and structure. IAS/Shunts: The atrial septum is grossly normal.  LEFT VENTRICLE PLAX 2D LVIDd:         4.30 cm      Diastology LVIDs:         3.80 cm      LV e' medial:    3.70 cm/s LV PW:         1.30 cm      LV E/e' medial:  23.2 LV IVS:        1.40 cm      LV e' lateral:   3.81 cm/s LVOT diam:     2.00 cm      LV E/e' lateral: 22.5 LV SV:         61 LV SV Index:   33 LVOT Area:     3.14 cm  LV Volumes (MOD) LV vol d, MOD A2C: 148.0 ml LV vol d, MOD A4C: 123.0 ml LV vol s, MOD A2C: 92.2 ml LV vol s, MOD A4C: 91.5 ml LV SV MOD A2C:     55.8 ml LV SV MOD A4C:     123.0 ml LV SV MOD BP:      46.2 ml RIGHT VENTRICLE RV  Basal diam:  2.90 cm RV Mid diam:    2.30 cm RV S prime:     9.68 cm/s TAPSE (M-mode): 1.9 cm LEFT ATRIUM             Index        RIGHT  ATRIUM           Index LA diam:        3.70 cm 2.00 cm/m   RA Area:     10.50 cm LA Vol (A2C):   53.7 ml 28.98 ml/m  RA Volume:   21.00 ml  11.33 ml/m LA Vol (A4C):   43.4 ml 23.43 ml/m LA Biplane Vol: 51.1 ml 27.58 ml/m  AORTIC VALVE AV Area (Vmax):    1.25 cm AV Area (Vmean):   1.26 cm AV Area (VTI):     1.27 cm AV Vmax:           232.67 cm/s AV Vmean:          159.667 cm/s AV VTI:            0.477 m AV Peak Grad:      21.7 mmHg AV Mean Grad:      12.0 mmHg LVOT Vmax:         92.60 cm/s LVOT Vmean:        64.200 cm/s LVOT VTI:          0.193 m LVOT/AV VTI ratio: 0.40  AORTA Ao Root diam: 3.50 cm MITRAL VALVE                TRICUSPID VALVE MV Area (PHT): 2.88 cm     TR Peak grad:   28.7 mmHg MV Area VTI:   1.64 cm     TR Vmax:        268.00 cm/s MV Peak grad:  8.8 mmHg MV Mean grad:  4.0 mmHg     SHUNTS MV Vmax:       1.48 m/s     Systemic VTI:  0.19 m MV Vmean:      89.4 cm/s    Systemic Diam: 2.00 cm MV Decel Time: 263 msec MV E velocity: 85.90 cm/s MV A velocity: 130.00 cm/s MV E/A ratio:  0.66 Keller Paterson Electronically signed by Keller Paterson Signature Date/Time: 01/21/2024/12:48:16 PM    Final    DG Chest Portable 1 View Result Date: 01/20/2024 CLINICAL DATA:  Shortness of breath. EXAM: PORTABLE CHEST 1 VIEW COMPARISON:  Chest radiograph dated 12/14/2023. FINDINGS: Cardiomegaly with vascular congestion and edema. Pneumonia is not excluded. No large pleural effusion. No pneumothorax. Atherosclerotic calcification of the aorta left pectoral pacemaker device. Degenerative changes of the spine. Right shoulder arthroplasty. No acute osseous pathology. IMPRESSION: CHF slightly progressed since the prior radiograph. Pneumonia is not excluded. Electronically Signed   By: Vanetta Chou M.D.   On: 01/20/2024 09:57    ECHO as above.  08/09/2023: 25-30% EF,  global hypokinesis, mod MR, mild-mod TR 02/13/2023: 60-65% EF, no RWMA, mild MR  TELEMETRY reviewed by me 01/21/2024: sinus rhythm rate 60s  EKG reviewed by me: sinus rhythm vs. Atrial paced with LBBB, rate 91 bpm  Data reviewed by me 01/21/2024: last 24h vitals tele labs imaging I/O ED provider note, admission H&P.  Principal Problem:   Acute exacerbation of CHF (congestive heart failure) (HCC) Active Problems:   Diabetes mellitus without complication (HCC)   Chronic a-fib (HCC)   Essential hypertension   COPD (chronic obstructive pulmonary disease) (HCC)   Acute on chronic combined systolic and diastolic CHF (congestive heart failure) (HCC)   CKD (chronic kidney disease) stage 4, GFR 15-29 ml/min (HCC)   Malnutrition of moderate degree    ASSESSMENT AND PLAN:  Leslie Parker is a 89 y.o. female  with a past medical  history of chronic HFrEF (EF 25-30% - 07/2023), paroxymal atrial fibrillation (on Eliquis , PO amio), SSS s/p [employer] dual chamber PPM (11/15/2022), hypertension,  type 2 diabetes mellitus, severe COPD stage 3 by GOLD classification, CKG stage 4 who presented to the ED on 01/20/2024 for worsening dyspnea and orthopnea. Of note patient is frequently hospitalized for acute on chronic heart failure, most recently 11/2023. Cardiology was consulted for further evaluation for acute on chronic HFrEF with BNP > 4500.  # Acute on chronic HFrEF (25-30%) # Acute hypoxic respiratory failure  Patient with hx of multiple hospitalizations a/w with AoCHF, presents with worsening dyspnea and orthopnea. Patient with reduced EF of 25-30% as of 07/2023. This admission, CXR with pulmonary vascular congestion. BNP elevated at > 4500. Echo this admission with EF 25-30%, grade II diastolic dysfunction, mild MR, mild LVH. (Echo similar to prior). Improvement in SOB since admission s/p IV lasix .  -Continue IV lasix  40 mg twice daily. Closely monitor UOP and renal function.  (Home dose: torsemide  20  mg BID) -Continue home empagliflozin  10 mg daily.  -Resume home Coreg  6.25 mg BID. -Consider resuming home Entresto  or losartan  if BP allows (Unclear if patient takes lisinopril )  -Not eligible for MRA with eGFR < 30.  -Of note in outpatient HF cardiology notes, there has been confusion with heart failure medications, questions if patients takes medications as prescribed.   # Paroxymal atrial fibrillation # SSS s/p dual chamber PPM (10/2022) # Chronic LBBB Patient recent PPM device check on 12/24/23 with 69% RV pacing and 41% RA pacing. EKG in ED with LBBB with QRS duration of 199. Per tele patient in SR rate 60s.  -Monitor and replenish electrolytes for a goal K >4, Mag >2  -Continue PO Eliquis  2.5 mg daily for stroke risk reduction.  -Continue home PO Amio 200 mg daily.  -Coreg  as stated above. -Patient recently saw Dr. Tobie outpatient (10/29/23) that noted 40% RA and 70% RV pacing and states unsure if upgrading to CRT would be ideal from risk-benefit standpoint given advanced age and current quality of life.  # Hypertension Patient without chest pain. Most recent SBP 110s. Trops minimally elevated and flat 36 > 46. EKG in ED with sinus rhythm vs. Atrial paced with LBBB, rate 91 bpm.  -Continue home Imdur  30 mg daily.  -Consider resuming home Entresto  or losartan  if BP allows (Unclear if patient takes lisinopril )  -Minimally elevated and flat trops in setting of AoCHFrEF is most consistent with demand/supply mismatch and not ACS    This patient's plan of care was discussed and created with Dr. Wilburn and he is in agreement.  Signed: Dorene Comfort, PA-C  01/21/2024, 1:05 PM [facility]

## 2024-01-21 NOTE — Plan of Care (Signed)

## 2024-01-21 NOTE — Assessment & Plan Note (Signed)
 Continue home Jardiance  Sensitive SSI

## 2024-01-21 NOTE — Progress Notes (Signed)
 Heart Failure Navigator Progress Note  Assessed for Heart & Vascular TOC clinic readiness.  Patient does not meet criteria due to current [facility] patient.   Navigator will sign off at this time.  [name], RN, BSN [facility] Heart Failure Navigator Secure Chat Only

## 2024-01-21 NOTE — Assessment & Plan Note (Signed)
-  S/p pacemaker in place

## 2024-01-21 NOTE — Assessment & Plan Note (Signed)
 Renal function currently stable and within baseline. - Monitor renal function while she is being diuresed -Avoid nephrotoxins

## 2024-01-21 NOTE — Assessment & Plan Note (Signed)
 Patient was currently on 2 L of oxygen.  Likely due to CHF exacerbation.  No wheezing. -Continue with supplemental oxygen -Wean as tolerated

## 2024-01-21 NOTE — Care Management Obs Status (Signed)
 MEDICARE OBSERVATION STATUS NOTIFICATION   Patient Details  Name: Leslie Parker MRN: [id number] Date of Birth: 05/14/1933   Medicare Observation Status Notification Given:  Yes    [name] 01/21/2024, 11:52 AM

## 2024-01-21 NOTE — Assessment & Plan Note (Signed)
-  Dietitian consult

## 2024-01-21 NOTE — Assessment & Plan Note (Signed)
 Patient with EF of 25 to 30% according to the prior echo.  BNP remained elevated above 4500 for a while. Cardiology was consulted -Continue with IV diuresis -Daily weight and BMP -Strict intake and output

## 2024-01-21 NOTE — Assessment & Plan Note (Signed)
 Blood pressure within goal. -Home lisinopril  is being held while she is being diuresed

## 2024-01-21 NOTE — TOC Progression Note (Signed)
 Transition of Care [facility]) - Progression Note    Patient Details  Name: Leslie Parker MRN: [id number] Date of Birth: 06/01/1933  Transition of Care [facility]) CM/SW Contact  Quintella Suzen Jansky, RN Phone Number: 01/21/2024, 1:04 PM  Clinical Narrative:      Spoke with patient, and discussed observation status, patient verbalized understanding.       Expected Discharge Plan and Services                                               Social Determinants of Health (SDOH) Interventions SDOH Screenings   Food Insecurity: No Food Insecurity (01/20/2024)  Housing: Low Risk  (01/20/2024)  Transportation Needs: No Transportation Needs (01/20/2024)  Utilities: Not At Risk (01/20/2024)  Alcohol Screen: Low Risk  (08/20/2023)  Financial Resource Strain: Low Risk  (01/13/2024)   Received from [facility]  Social Connections: Moderately Isolated (01/20/2024)  Tobacco Use: Low Risk  (01/20/2024)    Readmission Risk Interventions    10/22/2023    2:28 PM 09/16/2023    2:52 PM 08/20/2023    3:45 PM  Readmission Risk Prevention Plan  Transportation Screening Complete Complete Complete  Medication Review Oceanographer) Complete Complete Complete  PCP or Specialist appointment within 3-5 days of discharge  Complete Complete  HRI or Home Care Consult Not Complete Patient refused Complete  HRI or Home Care Consult Pt Refusal Comments No PT recs at this time.    SW Recovery Care/Counseling Consult Complete Complete Complete  Palliative Care Screening Not Applicable Not Applicable Not Applicable  Skilled Nursing Facility Not Applicable Not Applicable Not Applicable

## 2024-01-21 NOTE — Plan of Care (Signed)
  Problem: Education: Goal: Knowledge of General Education information will improve Description: Including pain rating scale, medication(s)/side effects and non-pharmacologic comfort measures Outcome: Progressing   Problem: Clinical Measurements: Goal: Will remain free from infection Outcome: Progressing Goal: Respiratory complications will improve Outcome: Progressing   Problem: Activity: Goal: Risk for activity intolerance will decrease Outcome: Progressing   Problem: Nutrition: Goal: Adequate nutrition will be maintained Outcome: Progressing   Problem: Elimination: Goal: Will not experience complications related to bowel motility Outcome: Progressing Goal: Will not experience complications related to urinary retention Outcome: Progressing   Problem: Safety: Goal: Ability to remain free from injury will improve Outcome: Progressing   Problem: Education: Goal: Ability to describe self-care measures that may prevent or decrease complications (Diabetes Survival Skills Education) will improve Outcome: Progressing Goal: Individualized Educational Video(s) Outcome: Progressing   Problem: Coping: Goal: Ability to adjust to condition or change in health will improve Outcome: Progressing   Problem: Fluid Volume: Goal: Ability to maintain a balanced intake and output will improve Outcome: Progressing   Problem: Health Behavior/Discharge Planning: Goal: Ability to identify and utilize available resources and services will improve Outcome: Progressing Goal: Ability to manage health-related needs will improve Outcome: Progressing   Problem: Metabolic: Goal: Ability to maintain appropriate glucose levels will improve Outcome: Progressing   Problem: Nutritional: Goal: Maintenance of adequate nutrition will improve Outcome: Progressing Goal: Progress toward achieving an optimal weight will improve Outcome: Progressing

## 2024-01-22 DIAGNOSIS — E43 Unspecified severe protein-calorie malnutrition: Secondary | ICD-10-CM | POA: Insufficient documentation

## 2024-01-22 DIAGNOSIS — I5043 Acute on chronic combined systolic (congestive) and diastolic (congestive) heart failure: Secondary | ICD-10-CM | POA: Diagnosis not present

## 2024-01-22 DIAGNOSIS — Z7189 Other specified counseling: Secondary | ICD-10-CM | POA: Diagnosis not present

## 2024-01-22 LAB — BASIC METABOLIC PANEL WITH GFR
Anion gap: 8 (ref 5–15)
BUN: 62 mg/dL — ABNORMAL HIGH (ref 8–23)
CO2: 27 mmol/L (ref 22–32)
Calcium: 8.1 mg/dL — ABNORMAL LOW (ref 8.9–10.3)
Chloride: 103 mmol/L (ref 98–111)
Creatinine, Ser: 2.21 mg/dL — ABNORMAL HIGH (ref 0.44–1.00)
GFR, Estimated: 21 mL/min — ABNORMAL LOW (ref >60)
Glucose, Bld: 117 mg/dL — ABNORMAL HIGH (ref 70–99)
Potassium: 4.4 mmol/L (ref 3.5–5.1)
Sodium: 138 mmol/L (ref 135–145)

## 2024-01-22 LAB — MAGNESIUM: Magnesium: 1.7 mg/dL (ref 1.7–2.4)

## 2024-01-22 LAB — GLUCOSE, CAPILLARY
Glucose-Capillary: 117 mg/dL — ABNORMAL HIGH (ref 70–99)
Glucose-Capillary: 155 mg/dL — ABNORMAL HIGH (ref 70–99)
Glucose-Capillary: 118 mg/dL — ABNORMAL HIGH (ref 70–99)
Glucose-Capillary: 165 mg/dL — ABNORMAL HIGH (ref 70–99)

## 2024-01-22 MED ORDER — ADULT MULTIVITAMIN W/MINERALS CH
1.0000 | ORAL_TABLET | Freq: Every day | ORAL | Status: DC
  Administered 2024-01-22 – 2024-01-23 (×2): 1 via ORAL
  Filled 2024-01-22 (×2): qty 1

## 2024-01-22 MED ORDER — MAGNESIUM SULFATE 2 GM/50ML IV SOLN
2.0000 g | Freq: Once | INTRAVENOUS | Status: AC
  Administered 2024-01-22: 2 g via INTRAVENOUS
  Filled 2024-01-22: qty 50
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
## 2024-01-22 NOTE — Plan of Care (Signed)
  Problem: Education: Goal: Knowledge of General Education information will improve Description: Including pain rating scale, medication(s)/side effects and non-pharmacologic comfort measures Outcome: Progressing   Problem: Clinical Measurements: Goal: Will remain free from infection Outcome: Progressing Goal: Respiratory complications will improve Outcome: Progressing Goal: Cardiovascular complication will be avoided Outcome: Progressing   Problem: Nutrition: Goal: Adequate nutrition will be maintained Outcome: Progressing   Problem: Coping: Goal: Level of anxiety will decrease Outcome: Progressing   Problem: Elimination: Goal: Will not experience complications related to urinary retention Outcome: Progressing   Problem: Pain Managment: Goal: General experience of comfort will improve and/or be controlled Outcome: Progressing   Problem: Safety: Goal: Ability to remain free from injury will improve Outcome: Progressing   Problem: Skin Integrity: Goal: Risk for impaired skin integrity will decrease Outcome: Progressing   Problem: Education: Goal: Ability to describe self-care measures that may prevent or decrease complications (Diabetes Survival Skills Education) will improve Outcome: Progressing   Problem: Coping: Goal: Ability to adjust to condition or change in health will improve Outcome: Progressing   Problem: Health Behavior/Discharge Planning: Goal: Ability to manage health-related needs will improve Outcome: Not Progressing   Problem: Activity: Goal: Risk for activity intolerance will decrease Outcome: Not Progressing   Problem: Health Behavior/Discharge Planning: Goal: Ability to manage health-related needs will improve Outcome: Not Progressing   Problem: Metabolic: Goal: Ability to maintain appropriate glucose levels will improve Outcome: Not Progressing

## 2024-01-22 NOTE — Progress Notes (Signed)
 [facility] CLINIC CARDIOLOGY PROGRESS NOTE       Patient ID: Leslie Parker MRN: [id number] DOB/AGE: May 01, 1933 89 y.o.  Admit date: 01/20/2024 Referring Physician Dr. Caleen Primary Physician Sherial Bail, MD Primary Cardiologist Dr. Florencio Reason for Consultation AoCHFrEF (BNP > 4500)  HPI: [name] is a 89 y.o. female  with a past medical history of chronic HFrEF (EF 25-30% - 07/2023), paroxymal atrial fibrillation (on Eliquis , PO amio), SSS s/p [employer] dual chamber PPM (11/15/2022), hypertension,  type 2 diabetes mellitus, severe COPD stage 3 by GOLD classification, CKG stage 4 who presented to the ED on 01/20/2024 for worsening dyspnea and orthopnea. Of note patient is frequently hospitalized for acute on chronic heart failure, most recently 11/2023. Cardiology was consulted for further evaluation for acute on chronic HFrEF with BNP > 4500.  Interval History: -Patient seen and examined this AM and laying comfortably in hospital bed at a slight incline. Patient states she feels good this AM and SOB is better today.  -Patients BP borderline and HR stable this AM. Overnight Tele showed no significant events.  -UOP not documented in chart. Renal function remained stable with diuresis.   -Electrolytes are stable.  -Patient remains on 2L with stable SpO2. (No o2 requirement at home).    Review of systems complete and found to be negative unless listed above    Past Medical History:  Diagnosis Date   Acute kidney injury superimposed on CKD (HCC) 11/24/2022   Asthma    Atrial fibrillation with RVR (HCC)    Cancer (HCC)    Basal Cell   CHF (congestive heart failure) (HCC)    Closed left hip fracture (HCC) 01/25/2017   Closed right hip fracture (HCC) 02/13/2023   Diabetes mellitus without complication (HCC)    Femur fracture, left (HCC) 02/03/2015   Heart murmur    Hypertension    Impetigo    Osteoarthritis    Osteopenia    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    Rapid atrial fibrillation, new onset(HCC) 06/05/2022   Vomiting    can not due to surgery   Wears dentures    full upper and lower    Past Surgical History:  Procedure Laterality Date   ABDOMINAL HYSTERECTOMY     BACK SURGERY     BLADDER SURGERY     mesh   CARPAL TUNNEL RELEASE Bilateral    CATARACT EXTRACTION Right 2017   CATARACT EXTRACTION W/PHACO Left 02/06/2016   Procedure: CATARACT EXTRACTION PHACO AND INTRAOCULAR LENS PLACEMENT (IOC) left eye;  Surgeon: Donzell Arlyce Budd, MD;  Location: [facility];  Service: Ophthalmology;  Laterality: Left;  DIABETIC LEFT Cannot arrive before 9:30   CERVICAL DISC SURGERY     CHOLECYSTECTOMY     EYE SURGERY Bilateral    Cataract Extraction with IOL   FEMUR IM NAIL Left 02/04/2015   Procedure: INTRAMEDULLARY (IM) NAIL FEMORAL;  Surgeon: Franky Cranker, MD;  Location: [facility];  Service: Orthopedics;  Laterality: Left;   HARDWARE REMOVAL Left 01/17/2017   Procedure: HARDWARE REMOVAL;  Surgeon: Cranker Franky, MD;  Location: [facility];  Service: Orthopedics;  Laterality: Left;   HERNIA REPAIR  2014   esophageal and gastric mesh. patient unable to throw up d/t mesh   HIP ARTHROPLASTY Left 01/26/2017   Procedure: ARTHROPLASTY BIPOLAR HIP (HEMIARTHROPLASTY) removal hardware left hip;  Surgeon: Cleotilde Barrio, MD;  Location: [facility];  Service: Orthopedics;  Laterality: Left;   INTRAMEDULLARY (IM) NAIL INTERTROCHANTERIC Right 02/13/2023   Procedure: INTRAMEDULLARY (  IM) NAIL INTERTROCHANTERIC;  Surgeon: Edie Norleen PARAS, MD;  Location: [facility];  Service: Orthopedics;  Laterality: Right;   JOINT REPLACEMENT Bilateral    knees   PACEMAKER IMPLANT N/A 11/15/2022   Procedure: PACEMAKER IMPLANT;  Surgeon: Ammon Blunt, MD;  Location: [facility];  Service: Cardiovascular;  Laterality: N/A;   PACEMAKER IMPLANT N/A 11/28/2022   Procedure: PACEMAKER IMPLANT;  Surgeon: Ammon Blunt, MD;  Location: [facility];   Service: Cardiovascular;  Laterality: N/A;  Lead reposition   REPLACEMENT TOTAL KNEE BILATERAL Bilateral [id number]   SHOULDER ARTHROSCOPY WITH OPEN ROTATOR CUFF REPAIR AND DISTAL CLAVICLE ACROMINECTOMY Left 10/25/2016   Procedure: SHOULDER ARTHROSCOPY WITH OPEN ROTATOR CUFF REPAIR AND DISTAL CLAVICLE ACROMINECTOMY;  Surgeon: Franky Cranker, MD;  Location: [facility];  Service: Orthopedics;  Laterality: Left;   THYROID  SURGERY     goiter removed   TOTAL HIP REVISION Left 12/02/2017   Procedure: TOTAL HIP REVISION;  Surgeon: Leora Lynwood SAUNDERS, MD;  Location: [facility];  Service: Orthopedics;  Laterality: Left;   TOTAL SHOULDER REPLACEMENT Right 2012    Medications Prior to Admission  Medication Sig Dispense Refill Last Dose/Taking   acetaminophen  (TYLENOL ) 500 MG tablet Take 2 tablets (1,000 mg total) by mouth 3 (three) times daily.   01/19/2024   albuterol  (VENTOLIN  HFA) 108 (90 Base) MCG/ACT inhaler Inhale 2 puffs into the lungs every 6 (six) hours as needed.   Unknown   alum & mag hydroxide-simeth (MAALOX/MYLANTA) 200-200-20 MG/5ML suspension Take 30 mLs by mouth every 6 (six) hours as needed for indigestion or heartburn.   Unknown   amiodarone  (PACERONE ) 200 MG tablet Take 1 tablet by mouth daily.   01/19/2024 Noon   apixaban  (ELIQUIS ) 2.5 MG TABS tablet Take 1 tablet (2.5 mg total) by mouth 2 (two) times daily. 60 tablet 1 01/19/2024 Noon   ascorbic acid  (VITAMIN C ) 250 MG tablet Take 250 mg by mouth daily.   01/19/2024 Noon   Cholecalciferol  (VITAMIN D -1000 MAX ST) 25 MCG (1000 UT) tablet Take 1,000 Units by mouth daily.   01/19/2024 Noon   colestipol  (COLESTID ) 1 g tablet Take 2 g by mouth.  Take 2 g by mouth 2 (two) times daily   01/19/2024 Noon   Cyanocobalamin  (B-12) 2500 MCG TABS Take 2,500 mcg by mouth daily.   01/19/2024   isosorbide  mononitrate (IMDUR ) 30 MG 24 hr tablet Take 30 mg by mouth.  Take 30 mg by mouth at bedtime   01/19/2024 Evening   JARDIANCE  10 MG TABS tablet    Past Week    lisinopril  (ZESTRIL ) 5 MG tablet Take 5 mg by mouth daily.   01/19/2024 Noon   Multiple Vitamin (MULTIVITAMIN WITH MINERALS) TABS tablet Take 1 tablet by mouth daily. One-A-Day Women's Vitamin   01/19/2024 Noon   ondansetron  (ZOFRAN ) 4 MG tablet Take 1 tablet (4 mg total) by mouth every 6 (six) hours as needed for nausea. 20 tablet 0 Unknown   polyethylene glycol (MIRALAX  / GLYCOLAX ) 17 g packet Take 17 g by mouth daily as needed.   Unknown   senna-docusate (SENOKOT-S) 8.6-50 MG tablet Take 2 tablets by mouth at bedtime as needed for mild constipation.   Unknown   torsemide  (DEMADEX ) 20 MG tablet Take 1 tablet (20 mg total) by mouth 2 (two) times daily.   01/19/2024 Noon   TRELEGY ELLIPTA 100-62.5-25 MCG/ACT AEPB Inhale 1 puff into the lungs daily.   01/20/2024 Morning   ipratropium-albuterol  (DUONEB) 0.5-2.5 (3) MG/3ML SOLN Inhale 3  mLs into the lungs every 6 (six) hours as needed. (Patient not taking: Reported on 01/20/2024)   Not Taking   oxyCODONE  (OXY IR/ROXICODONE ) 5 MG immediate release tablet Take 2.5 mg by mouth every 6 (six) hours as needed for severe pain (pain score 7-10). (Patient not taking: Reported on 01/20/2024)   Not Taking   Social History   Socioeconomic History   Marital status: Divorced    Spouse name: Not on file   Number of children: 4   Years of education: Not on file   Highest education level: 12th grade  Occupational History   Occupation: Retitred/Disability  Tobacco Use   Smoking status: Never   Smokeless tobacco: Never  Vaping Use   Vaping status: Never Used  Substance and Sexual Activity   Alcohol use: No    Alcohol/week: 0.0 standard drinks of alcohol   Drug use: No   Sexual activity: Not Currently  Other Topics Concern   Not on file  Social History Narrative   Not on file   Social Drivers of Health   Financial Resource Strain: Low Risk  (01/13/2024)   Received from [facility]   Overall Financial Resource Strain (CARDIA)    Difficulty  of Paying Living Expenses: Not hard at all  Food Insecurity: No Food Insecurity (01/20/2024)   Hunger Vital Sign    Worried About Running Out of Food in the Last Year: Never true    Ran Out of Food in the Last Year: Never true  Transportation Needs: No Transportation Needs (01/20/2024)   PRAPARE - Administrator, Civil Service (Medical): No    Lack of Transportation (Non-Medical): No  Physical Activity: Not on file  Stress: Not on file  Social Connections: Moderately Isolated (01/20/2024)   Social Connection and Isolation Panel    Frequency of Communication with Friends and Family: More than three times a week    Frequency of Social Gatherings with Friends and Family: Three times a week    Attends Religious Services: 1 to 4 times per year    Active Member of Clubs or Organizations: No    Attends Banker Meetings: Never    Marital Status: Divorced  Catering manager Violence: Not At Risk (01/20/2024)   Humiliation, Afraid, Rape, and Kick questionnaire    Fear of Current or Ex-Partner: No    Emotionally Abused: No    Physically Abused: No    Sexually Abused: No    Family History  Problem Relation Age of Onset   Heart failure Mother    Hypertension Mother    Emphysema Father      Vitals:   01/21/24 2100 01/21/24 2353 01/22/24 0417 01/22/24 0741  BP: (!) 106/56 (!) 116/56 (!) 119/55 119/61  Pulse: 61 (!) 59 66 63  Resp: 19 20 16    Temp: 97.7 F (36.5 C) 98.2 F (36.8 C) 98.6 F (37 C) 97.8 F (36.6 C)  TempSrc: Oral Oral Oral   SpO2: 97% 98% 97% 100%  Weight:      Height:        PHYSICAL EXAM General: Chronically ill appearing female, well nourished, in no acute distress. HEENT: Normocephalic and atraumatic. Neck: No JVD.   Lungs: Normal respiratory effort on 2L. CTAB Heart: HRRR. Normal S1 and S2 without gallops or murmurs.  Abdomen: Non-distended appearing.  Msk: Normal strength and tone for age. Extremities: Warm and well perfused. No  clubbing, cyanosis, edema.  Neuro: Alert and oriented X 3. Psych:  Answers questions appropriately.   Labs: Basic Metabolic Panel: Recent Labs    01/21/24 0250 01/22/24 0439  NA 140 138  K 3.6 4.4  CL 103 103  CO2 25 27  GLUCOSE 125* 117*  BUN 59* 62*  CREATININE 2.16* 2.21*  CALCIUM 8.5* 8.1*  MG  --  1.7   Liver Function Tests: Recent Labs    01/20/24 0917  AST 34  ALT 27  ALKPHOS 99  BILITOT 1.0  PROT 6.0*  ALBUMIN  3.0*   No results for input(s): LIPASE, AMYLASE in the last 72 hours. CBC: Recent Labs    01/20/24 0917 01/21/24 0250  WBC 5.7 6.3  NEUTROABS 3.8  --   HGB 10.8* 10.6*  HCT 32.8* 31.4*  MCV 96.5 93.7  PLT 261 254   Cardiac Enzymes: Recent Labs    01/20/24 0917 01/20/24 1051  TROPONINIHS 36* 46*   BNP: Recent Labs    01/20/24 0917  BNP >4,500.0*   D-Dimer: No results for input(s): DDIMER in the last 72 hours. Hemoglobin A1C: No results for input(s): HGBA1C in the last 72 hours. Fasting Lipid Panel: No results for input(s): CHOL, HDL, LDLCALC, TRIG, CHOLHDL, LDLDIRECT in the last 72 hours. Thyroid  Function Tests: No results for input(s): TSH, T4TOTAL, T3FREE, THYROIDAB in the last 72 hours.  Invalid input(s): FREET3 Anemia Panel: No results for input(s): VITAMINB12, FOLATE, FERRITIN, TIBC, IRON, RETICCTPCT in the last 72 hours.   Radiology: ECHOCARDIOGRAM COMPLETE Result Date: 01/21/2024    ECHOCARDIOGRAM REPORT   Patient Name:   ASHEA WINIARSKI Sia Date of Exam: 01/21/2024 Medical Rec #:  [id number]     Height:       69.0 in Accession #:    [id number]    Weight:       154.8 lb Date of Birth:  [birth year]      BSA:          1.853 m Patient Age:    89 years      BP:           130/64 mmHg Patient Gender: F             HR:           71 bpm. Exam Location:  [facility] Procedure: 2D Echo, Cardiac Doppler, Color Doppler and Strain Analysis (Both            Spectral and Color Flow Doppler were utilized during  procedure). Indications:     Abnormal ECG R94.31  History:         Patient has prior history of Echocardiogram examinations, most                  recent 08/09/2023. CHF, Pacemaker; Risk Factors:Hypertension and                  Diabetes.  Sonographer:     Christopher Furnace Referring Phys:  [id number] SUMAYYA AMIN Diagnosing Phys: Keller Paterson  Sonographer Comments: Global longitudinal strain was attempted. IMPRESSIONS  1. Left ventricular ejection fraction, by estimation, is 25 to 30%. The left ventricle has severely decreased function. The left ventricle demonstrates regional wall motion abnormalities (see scoring diagram/findings for description). There is mild left  ventricular hypertrophy. Left ventricular diastolic parameters are consistent with Grade II diastolic dysfunction (pseudonormalization).  2. Right ventricular systolic function is normal. The right ventricular size is normal.  3. Left atrial size was mildly dilated.  4. Mild mitral valve regurgitation.  5. The aortic valve is tricuspid. Aortic  valve regurgitation is not visualized. Mild aortic valve stenosis. FINDINGS  Left Ventricle: Left ventricular ejection fraction, by estimation, is 25 to 30%. The left ventricle has severely decreased function. The left ventricle demonstrates regional wall motion abnormalities. The left ventricular internal cavity size was normal  in size. There is mild left ventricular hypertrophy. Left ventricular diastolic parameters are consistent with Grade II diastolic dysfunction (pseudonormalization).  LV Wall Scoring: The mid and distal anterior wall, mid and distal lateral wall, mid and distal anterior septum, entire apex, mid and distal inferior wall, mid anterolateral segment, and mid inferoseptal segment are hypokinetic. The basal anteroseptal segment, basal inferolateral segment, basal anterolateral segment, basal anterior segment, basal inferior segment, and basal inferoseptal segment are normal. Right Ventricle: The right  ventricular size is normal. No increase in right ventricular wall thickness. Right ventricular systolic function is normal. Left Atrium: Left atrial size was mildly dilated. Right Atrium: Right atrial size was normal in size. Pericardium: There is no evidence of pericardial effusion. Mitral Valve: There is moderate thickening of the mitral valve leaflet(s). Mild mitral annular calcification. Mild mitral valve regurgitation. MV peak gradient, 8.8 mmHg. The mean mitral valve gradient is 4.0 mmHg. Tricuspid Valve: The tricuspid valve is normal in structure. Tricuspid valve regurgitation is mild. Aortic Valve: The aortic valve is tricuspid. Aortic valve regurgitation is not visualized. Mild aortic stenosis is present. Aortic valve mean gradient measures 12.0 mmHg. Aortic valve peak gradient measures 21.7 mmHg. Aortic valve area, by VTI measures 1.27 cm. Pulmonic Valve: The pulmonic valve was not well visualized. Pulmonic valve regurgitation is not visualized. Aorta: The aortic root is normal in size and structure. IAS/Shunts: The atrial septum is grossly normal.  LEFT VENTRICLE PLAX 2D LVIDd:         4.30 cm      Diastology LVIDs:         3.80 cm      LV e' medial:    3.70 cm/s LV PW:         1.30 cm      LV E/e' medial:  23.2 LV IVS:        1.40 cm      LV e' lateral:   3.81 cm/s LVOT diam:     2.00 cm      LV E/e' lateral: 22.5 LV SV:         61 LV SV Index:   33 LVOT Area:     3.14 cm  LV Volumes (MOD) LV vol d, MOD A2C: 148.0 ml LV vol d, MOD A4C: 123.0 ml LV vol s, MOD A2C: 92.2 ml LV vol s, MOD A4C: 91.5 ml LV SV MOD A2C:     55.8 ml LV SV MOD A4C:     123.0 ml LV SV MOD BP:      46.2 ml RIGHT VENTRICLE RV Basal diam:  2.90 cm RV Mid diam:    2.30 cm RV S prime:     9.68 cm/s TAPSE (M-mode): 1.9 cm LEFT ATRIUM             Index        RIGHT ATRIUM           Index LA diam:        3.70 cm 2.00 cm/m   RA Area:     10.50 cm LA Vol (A2C):   53.7 ml 28.98 ml/m  RA Volume:   21.00 ml  11.33 ml/m LA Vol (A4C):    43.4 ml  23.43 ml/m LA Biplane Vol: 51.1 ml 27.58 ml/m  AORTIC VALVE AV Area (Vmax):    1.25 cm AV Area (Vmean):   1.26 cm AV Area (VTI):     1.27 cm AV Vmax:           232.67 cm/s AV Vmean:          159.667 cm/s AV VTI:            0.477 m AV Peak Grad:      21.7 mmHg AV Mean Grad:      12.0 mmHg LVOT Vmax:         92.60 cm/s LVOT Vmean:        64.200 cm/s LVOT VTI:          0.193 m LVOT/AV VTI ratio: 0.40  AORTA Ao Root diam: 3.50 cm MITRAL VALVE                TRICUSPID VALVE MV Area (PHT): 2.88 cm     TR Peak grad:   28.7 mmHg MV Area VTI:   1.64 cm     TR Vmax:        268.00 cm/s MV Peak grad:  8.8 mmHg MV Mean grad:  4.0 mmHg     SHUNTS MV Vmax:       1.48 m/s     Systemic VTI:  0.19 m MV Vmean:      89.4 cm/s    Systemic Diam: 2.00 cm MV Decel Time: 263 msec MV E velocity: 85.90 cm/s MV A velocity: 130.00 cm/s MV E/A ratio:  0.66 Keller Paterson Electronically signed by Keller Paterson Signature Date/Time: 01/21/2024/12:48:16 PM    Final    DG Chest Portable 1 View Result Date: 01/20/2024 CLINICAL DATA:  Shortness of breath. EXAM: PORTABLE CHEST 1 VIEW COMPARISON:  Chest radiograph dated 12/14/2023. FINDINGS: Cardiomegaly with vascular congestion and edema. Pneumonia is not excluded. No large pleural effusion. No pneumothorax. Atherosclerotic calcification of the aorta left pectoral pacemaker device. Degenerative changes of the spine. Right shoulder arthroplasty. No acute osseous pathology. IMPRESSION: CHF slightly progressed since the prior radiograph. Pneumonia is not excluded. Electronically Signed   By: Vanetta Chou M.D.   On: 01/20/2024 09:57    ECHO as above.  08/09/2023: 25-30% EF, global hypokinesis, mod MR, mild-mod TR 02/13/2023: 60-65% EF, no RWMA, mild MR  TELEMETRY reviewed by me 01/22/2024: AV paced, rate 60s  EKG reviewed by me: sinus rhythm vs. Atrial paced with LBBB, rate 91 bpm  Data reviewed by me 01/22/2024: last 24h vitals tele labs imaging I/O hospitalist progress  notes.  Principal Problem:   Acute exacerbation of CHF (congestive heart failure) (HCC) Active Problems:   Diabetes mellitus without complication (HCC)   Chronic a-fib (HCC)   Essential hypertension   COPD (chronic obstructive pulmonary disease) (HCC)   Sick sinus syndrome (HCC)   Acute on chronic combined systolic and diastolic CHF (congestive heart failure) (HCC)   CKD (chronic kidney disease) stage 4, GFR 15-29 ml/min (HCC)   Malnutrition of moderate degree   Acute hypoxic respiratory failure (HCC)    ASSESSMENT AND PLAN:  Leslie Parker is a 89 y.o. female  with a past medical history of chronic HFrEF (EF 25-30% - 07/2023), paroxymal atrial fibrillation (on Eliquis , PO amio), SSS s/p [employer] dual chamber PPM (11/15/2022), hypertension,  type 2 diabetes mellitus, severe COPD stage 3 by GOLD classification, CKG stage 4 who presented to the ED on 01/20/2024 for worsening dyspnea and  orthopnea. Of note patient is frequently hospitalized for acute on chronic heart failure, most recently 11/2023. Cardiology was consulted for further evaluation for acute on chronic HFrEF with BNP > 4500.  # Acute on chronic HFrEF (25-30%) # Acute hypoxic respiratory failure  Patient with hx of multiple hospitalizations a/w with AoCHF, presents with worsening dyspnea and orthopnea. Patient with reduced EF of 25-30% as of 07/2023. This admission, CXR with pulmonary vascular congestion. BNP elevated at > 4500. Echo this admission with EF 25-30%, grade II diastolic dysfunction, mild MR, mild LVH. (Echo similar to prior). Improvement in SOB since admission s/p IV lasix .  -Continue IV lasix  40 mg twice daily. Closely monitor UOP and renal function.  (Home dose: torsemide  20 mg BID) -Continue home empagliflozin  10 mg daily.  -Continue home Coreg  6.25 mg BID. -Will consider starting lose dose losartan  tomorrow if BP allows (patient states she takes Entresto  at home, not lisinopril ).  -Not eligible for MRA  with eGFR < 30.  -Of note in outpatient HF cardiology notes, there has been confusion with heart failure medications, questions if patients takes medications as prescribed.   # Paroxymal atrial fibrillation # SSS s/p dual chamber PPM (10/2022) # Chronic LBBB Patient recent PPM device check on 12/24/23 with 69% RV pacing and 41% RA pacing. EKG in ED with LBBB with QRS duration of 199. Per tele patient in SR rate 60s.  -Monitor and replenish electrolytes for a goal K >4, Mag >2  -Continue PO Eliquis  2.5 mg daily for stroke risk reduction.  -Continue home PO Amio 200 mg daily.  -Coreg  as stated above. -Patient recently saw Dr. Tobie outpatient (10/29/23) that noted 40% RA and 70% RV pacing and states unsure if upgrading to CRT would be ideal from risk-benefit standpoint given advanced age and current quality of life.  # Hypertension Patient without chest pain. Most recent SBP 110s. Trops minimally elevated and flat 36 > 46. EKG in ED with sinus rhythm vs. Atrial paced with LBBB, rate 91 bpm.  -Continue home Imdur  30 mg daily.  -losartan  as stated above.  -Minimally elevated and flat trops in setting of AoCHFrEF is most consistent with demand/supply mismatch and not ACS    This patient's plan of care was discussed and created with Dr. Wilburn and he is in agreement.  Signed: Dorene Comfort, PA-C  01/22/2024, 11:14 AM [facility]

## 2024-01-22 NOTE — Care Management Important Message (Incomplete)
 Important Message  Patient Details  Name: Leslie Parker MRN: [id number] Date of Birth: 17-Apr-1933   Important Message Given:  Yes - Medicare IM     [name] 01/22/2024, 1:25 PM

## 2024-01-22 NOTE — Progress Notes (Signed)
 Progress Note    Leslie Parker  FMW:[id number] DOB: August 20, 1932  DOA: 01/20/2024 PCP: Sherial Bail, MD      Brief Narrative:    Medical records reviewed and are as summarized below:  Leslie Parker is a 89 y.o. female with medical history significant of chronic HFrEF with last EF of 25-30%, SSS s/p PPM, atrial fibrillation on Eliquis , COPD, hypertension, hyperlipidemia, CKD stage IV, osteopenia, recent scapular fracture, multiple hospital admissions for COPD and CHF (about 12 admissions in the last 6 months). She presented to the hospital because of shortness of breath, orthopnea and low oxygen saturation in the 80s.  She does not use home oxygen.  BNP was 4500.  She was admitted for CHF exacerbation complicated by acute hypoxic respiratory failure.   Assessment/Plan:   Principal Problem:   Acute exacerbation of CHF (congestive heart failure) (HCC) Active Problems:   Acute on chronic combined systolic and diastolic CHF (congestive heart failure) (HCC)   Acute hypoxic respiratory failure (HCC)   Chronic a-fib (HCC)   CKD (chronic kidney disease) stage 4, GFR 15-29 ml/min (HCC)   Essential hypertension   Sick sinus syndrome (HCC)   Diabetes mellitus without complication (HCC)   COPD (chronic obstructive pulmonary disease) (HCC)   Malnutrition of moderate degree   Protein-calorie malnutrition, severe   Nutrition Problem: Severe Malnutrition Etiology: chronic illness (CHF)  Signs/Symptoms: mild fat depletion, moderate fat depletion, moderate muscle depletion, severe muscle depletion, edema, percent weight loss   Body mass index is 22.85 kg/m.   Acute on chronic combined systolic and diastolic CHF: Continue IV Lasix .  Continue Imdur , hydralazine , carvedilol  and empagliflozin .   Monitor BMP, daily weight and urine output.  Follow-up with cardiologist. 2D echo 01/21/2024 showed EF estimated at 25 to 30%, mild LVH, grade 2 diastolic dysfunction, mild aortic valve  stenosis, mild MR.   Acute hypoxic respiratory failure: Continue 2 L/min oxygen via [place].  Wean off oxygen as able.   Chronic A-fib: Rate controlled.  Continue amiodarone  and Eliquis    CKD stage IV: Creatinine stable.   COPD: Stable.  Continue bronchodilators   Comorbidities include type II DM, s/p permanent pacemaker in place for SSS, severe protein-calorie malnutrition   Diet Order             Diet 2 gram sodium Fluid consistency: Thin  Diet effective now                            Consultants: Cardiologist  Procedures: None    Medications:    amiodarone   200 mg Oral Daily   apixaban   2.5 mg Oral BID   budesonide -glycopyrrolate -formoterol   2 puff Inhalation BID   carvedilol   6.25 mg Oral BID WC   colestipol   2 g Oral BID   empagliflozin   10 mg Oral Daily   furosemide   40 mg Intravenous BID   insulin  aspart  0-5 Units Subcutaneous QHS   insulin  aspart  0-6 Units Subcutaneous TID WC   isosorbide  mononitrate  30 mg Oral Daily   multivitamin with minerals  1 tablet Oral Daily   tamsulosin  0.4 mg Oral Daily   Continuous Infusions:  magnesium  sulfate bolus IVPB       Anti-infectives (From admission, onward)    None              Family Communication/Anticipated D/C date and plan/Code Status   DVT prophylaxis: apixaban  (ELIQUIS ) tablet 2.5 mg Start:  01/20/24 1400 apixaban  (ELIQUIS ) tablet 2.5 mg     Code Status: Limited: Do not attempt resuscitation (DNR) -DNR-LIMITED -Do Not Intubate/DNI   Family Communication: None Disposition Plan: Plan to discharge home   Status is: Inpatient Remains inpatient appropriate because: CHF exacerbation       Subjective:   Interval events noted.  She complains of cough.  Breathing is a lot better.  Objective:    Vitals:   01/21/24 2353 01/22/24 0417 01/22/24 0741 01/22/24 1115  BP: (!) 116/56 (!) 119/55 119/61 129/60  Pulse: (!) 59 66 63 65  Resp: 20 16    Temp: 98.2 F (36.8 C)  98.6 F (37 C) 97.8 F (36.6 C) 97.8 F (36.6 C)  TempSrc: Oral Oral    SpO2: 98% 97% 100% 99%  Weight:      Height:       No data found.   Intake/Output Summary (Last 24 hours) at 01/22/2024 1350 Last data filed at 01/22/2024 1100 Gross per 24 hour  Intake 840 ml  Output 650 ml  Net 190 ml   Filed Weights   01/20/24 0850 01/20/24 2253 01/21/24 0336  Weight: 68.9 kg 70.2 kg 70.2 kg    Exam:  GEN: NAD, sitting up in the chair SKIN: Warm and dry EYES: No pallor or icterus ENT: MMM CV: RRR PULM: Bibasilar rales.  No wheezing ABD: soft, ND, NT, +BS CNS: AAO x 3, non focal EXT: No edema or tenderness       Data Reviewed:   I have personally reviewed following labs and imaging studies:  Labs: Labs show the following:   Basic Metabolic Panel: Recent Labs  Lab 01/20/24 0917 01/21/24 0250 01/22/24 0439  NA 137 140 138  K 3.9 3.6 4.4  CL 104 103 103  CO2 21* 25 27  GLUCOSE 171* 125* 117*  BUN 59* 59* 62*  CREATININE 2.24* 2.16* 2.21*  CALCIUM 8.5* 8.5* 8.1*  MG  --   --  1.7   GFR Estimated Creatinine Clearance: 18 mL/min (A) (by C-G formula based on SCr of 2.21 mg/dL (H)). Liver Function Tests: Recent Labs  Lab 01/20/24 0917  AST 34  ALT 27  ALKPHOS 99  BILITOT 1.0  PROT 6.0*  ALBUMIN  3.0*   No results for input(s): LIPASE, AMYLASE in the last 168 hours. No results for input(s): AMMONIA in the last 168 hours. Coagulation profile No results for input(s): INR, PROTIME in the last 168 hours.  CBC: Recent Labs  Lab 01/20/24 0917 01/21/24 0250  WBC 5.7 6.3  NEUTROABS 3.8  --   HGB 10.8* 10.6*  HCT 32.8* 31.4*  MCV 96.5 93.7  PLT 261 254   Cardiac Enzymes: No results for input(s): CKTOTAL, CKMB, CKMBINDEX, TROPONINI in the last 168 hours. BNP (last 3 results) No results for input(s): PROBNP in the last 8760 hours. CBG: Recent Labs  Lab 01/21/24 1717 01/21/24 2202 01/22/24 0741 01/22/24 1113  GLUCAP 175* 161*  117* 155*   D-Dimer: No results for input(s): DDIMER in the last 72 hours. Hgb A1c: No results for input(s): HGBA1C in the last 72 hours. Lipid Profile: No results for input(s): CHOL, HDL, LDLCALC, TRIG, CHOLHDL, LDLDIRECT in the last 72 hours. Thyroid  function studies: No results for input(s): TSH, T4TOTAL, T3FREE, THYROIDAB in the last 72 hours.  Invalid input(s): FREET3 Anemia work up: No results for input(s): VITAMINB12, FOLATE, FERRITIN, TIBC, IRON, RETICCTPCT in the last 72 hours. Sepsis Labs: Recent Labs  Lab 01/20/24 [phone] 01/21/24 0250  WBC 5.7 6.3    Microbiology No results found for this or any previous visit (from the past 240 hours).  Procedures and diagnostic studies:  ECHOCARDIOGRAM COMPLETE Result Date: 01/21/2024    ECHOCARDIOGRAM REPORT   Patient Name:   Leslie Parker Date of Exam: 01/21/2024 Medical Rec #:  [id number]     Height:       69.0 in Accession #:    [id number]    Weight:       154.8 lb Date of Birth:  03-Sep-1932      BSA:          1.853 m Patient Age:    89 years      BP:           130/64 mmHg Patient Gender: F             HR:           71 bpm. Exam Location:  [facility] Procedure: 2D Echo, Cardiac Doppler, Color Doppler and Strain Analysis (Both            Spectral and Color Flow Doppler were utilized during procedure). Indications:     Abnormal ECG R94.31  History:         Patient has prior history of Echocardiogram examinations, most                  recent 08/09/2023. CHF, Pacemaker; Risk Factors:Hypertension and                  Diabetes.  Sonographer:     Christopher Furnace Referring Phys:  [id number] SUMAYYA AMIN Diagnosing Phys: Keller Paterson  Sonographer Comments: Global longitudinal strain was attempted. IMPRESSIONS  1. Left ventricular ejection fraction, by estimation, is 25 to 30%. The left ventricle has severely decreased function. The left ventricle demonstrates regional wall motion abnormalities (see scoring diagram/findings for  description). There is mild left  ventricular hypertrophy. Left ventricular diastolic parameters are consistent with Grade II diastolic dysfunction (pseudonormalization).  2. Right ventricular systolic function is normal. The right ventricular size is normal.  3. Left atrial size was mildly dilated.  4. Mild mitral valve regurgitation.  5. The aortic valve is tricuspid. Aortic valve regurgitation is not visualized. Mild aortic valve stenosis. FINDINGS  Left Ventricle: Left ventricular ejection fraction, by estimation, is 25 to 30%. The left ventricle has severely decreased function. The left ventricle demonstrates regional wall motion abnormalities. The left ventricular internal cavity size was normal  in size. There is mild left ventricular hypertrophy. Left ventricular diastolic parameters are consistent with Grade II diastolic dysfunction (pseudonormalization).  LV Wall Scoring: The mid and distal anterior wall, mid and distal lateral wall, mid and distal anterior septum, entire apex, mid and distal inferior wall, mid anterolateral segment, and mid inferoseptal segment are hypokinetic. The basal anteroseptal segment, basal inferolateral segment, basal anterolateral segment, basal anterior segment, basal inferior segment, and basal inferoseptal segment are normal. Right Ventricle: The right ventricular size is normal. No increase in right ventricular wall thickness. Right ventricular systolic function is normal. Left Atrium: Left atrial size was mildly dilated. Right Atrium: Right atrial size was normal in size. Pericardium: There is no evidence of pericardial effusion. Mitral Valve: There is moderate thickening of the mitral valve leaflet(s). Mild mitral annular calcification. Mild mitral valve regurgitation. MV peak gradient, 8.8 mmHg. The mean mitral valve gradient is 4.0 mmHg. Tricuspid Valve: The tricuspid valve is normal in structure. Tricuspid valve regurgitation is mild. Aortic Valve: The aortic  valve is  tricuspid. Aortic valve regurgitation is not visualized. Mild aortic stenosis is present. Aortic valve mean gradient measures 12.0 mmHg. Aortic valve peak gradient measures 21.7 mmHg. Aortic valve area, by VTI measures 1.27 cm. Pulmonic Valve: The pulmonic valve was not well visualized. Pulmonic valve regurgitation is not visualized. Aorta: The aortic root is normal in size and structure. IAS/Shunts: The atrial septum is grossly normal.  LEFT VENTRICLE PLAX 2D LVIDd:         4.30 cm      Diastology LVIDs:         3.80 cm      LV e' medial:    3.70 cm/s LV PW:         1.30 cm      LV E/e' medial:  23.2 LV IVS:        1.40 cm      LV e' lateral:   3.81 cm/s LVOT diam:     2.00 cm      LV E/e' lateral: 22.5 LV SV:         61 LV SV Index:   33 LVOT Area:     3.14 cm  LV Volumes (MOD) LV vol d, MOD A2C: 148.0 ml LV vol d, MOD A4C: 123.0 ml LV vol s, MOD A2C: 92.2 ml LV vol s, MOD A4C: 91.5 ml LV SV MOD A2C:     55.8 ml LV SV MOD A4C:     123.0 ml LV SV MOD BP:      46.2 ml RIGHT VENTRICLE RV Basal diam:  2.90 cm RV Mid diam:    2.30 cm RV S prime:     9.68 cm/s TAPSE (M-mode): 1.9 cm LEFT ATRIUM             Index        RIGHT ATRIUM           Index LA diam:        3.70 cm 2.00 cm/m   RA Area:     10.50 cm LA Vol (A2C):   53.7 ml 28.98 ml/m  RA Volume:   21.00 ml  11.33 ml/m LA Vol (A4C):   43.4 ml 23.43 ml/m LA Biplane Vol: 51.1 ml 27.58 ml/m  AORTIC VALVE AV Area (Vmax):    1.25 cm AV Area (Vmean):   1.26 cm AV Area (VTI):     1.27 cm AV Vmax:           232.67 cm/s AV Vmean:          159.667 cm/s AV VTI:            0.477 m AV Peak Grad:      21.7 mmHg AV Mean Grad:      12.0 mmHg LVOT Vmax:         92.60 cm/s LVOT Vmean:        64.200 cm/s LVOT VTI:          0.193 m LVOT/AV VTI ratio: 0.40  AORTA Ao Root diam: 3.50 cm MITRAL VALVE                TRICUSPID VALVE MV Area (PHT): 2.88 cm     TR Peak grad:   28.7 mmHg MV Area VTI:   1.64 cm     TR Vmax:        268.00 cm/s MV Peak grad:  8.8 mmHg MV Mean grad:   4.0 mmHg     SHUNTS MV Vmax:  1.48 m/s     Systemic VTI:  0.19 m MV Vmean:      89.4 cm/s    Systemic Diam: 2.00 cm MV Decel Time: 263 msec MV E velocity: 85.90 cm/s MV A velocity: 130.00 cm/s MV E/A ratio:  0.66 Keller Paterson Electronically signed by Keller Paterson Signature Date/Time: 01/21/2024/12:48:16 PM    Final                LOS: 2 days   Brigido Mera  [facility]   Pager on [email or web address]. If 7PM-7AM, please contact night-coverage at [email or web address]     01/22/2024, 1:50 PM

## 2024-01-22 NOTE — Progress Notes (Addendum)
 Initial Nutrition Assessment  DOCUMENTATION CODES:   Severe malnutrition in context of chronic illness  INTERVENTION:   -Liberalize diet to 2 gram sodium for wider variety of meal selections -MVI with minerals daily -Magic cup TID with meals, each supplement provides 290 kcal and 9 grams of protein  -Reviewed low sodium diet with pt; RD provided Heart Failure Nutrition Therapy for the Undernourished from [facility] Nutrition Care Manual; attached to AVS/ discharge summary   NUTRITION DIAGNOSIS:   Severe Malnutrition related to chronic illness (CHF) as evidenced by mild fat depletion, moderate fat depletion, moderate muscle depletion, severe muscle depletion, edema, percent weight loss.  GOAL:   Patient will meet greater than or equal to 90% of their needs  MONITOR:   PO intake, Supplement acceptance  REASON FOR ASSESSMENT:   Consult Assessment of nutrition requirement/status  ASSESSMENT:   Prt with medical history significant for hfref, a-fib, ckd 4, copd, dm, htn, bradycardia s/p pacemaker, frequent hospitalizations, who presents with shortness of breath.  Pt admitted with CHF exacerbation.  Reviewed I/O's: +840 ml x 24 hours and -960 ml x 24 hours  Spoke with pt at bedside, who was pleasant and in good spirits today. Pt reports feeling better since admission and reports her appetite has returned. Per pt, she generally consumes 2-3 meals per day. Pt shares that she has two granddaughters who are active in her care and prepare meals for her. Pt and granddaughters are very mindful of ensuring pt does not eat added sodium. Documented meal completions 75-100%.   Pt currently on a heart healthy diet. RD reviewed importance of sodium restriction and low sodium diet. RD liberalized to 2 gram sodium, which pt was appreciative of.   Pt endorses significant wt loss over the past month, however, unsure how much. Pt is unsure why she has lost weight. She denies any difference is  functional status. Noted pt has experienced a 11.3% wt loss over the past 3 months, which is significant for time frame.   Discussed importance of good meal and supplement intake to promote healing. Pt states she does not like Ensure and will refuse to drink it if offered. RD will trial Magic Cups.   Palliative care following; pt is DNR/ DNI and plan to treat the treatable.   Case discussed with RN regarding interventions.   Medications reviewed and include lasix .   Labs reviewed: CBGS: 161-175 (inpatient orders for glycemic control are 0-6 units insulin  aspart TID with meals and 0-85 units insulin  aspart daily at bedtime).    NUTRITION - FOCUSED PHYSICAL EXAM:  Flowsheet Row Most Recent Value  Orbital Region Mild depletion  Upper Arm Region Severe depletion  Thoracic and Lumbar Region Moderate depletion  Buccal Region Moderate depletion  Temple Region Moderate depletion  Clavicle Bone Region Severe depletion  Clavicle and Acromion Bone Region Severe depletion  Scapular Bone Region Severe depletion  Dorsal Hand Moderate depletion  Patellar Region Moderate depletion  Anterior Thigh Region Moderate depletion  Posterior Calf Region Moderate depletion  Edema (RD Assessment) Mild  Hair Reviewed  Eyes Reviewed  Mouth Reviewed  Skin Reviewed  Nails Reviewed    Diet Order:   Diet Order             Diet 2 gram sodium Fluid consistency: Thin  Diet effective now                   EDUCATION NEEDS:   Education needs have been addressed  Skin:  Skin Assessment:  Reviewed RN Assessment  Last BM:  01/19/24  Height:   Ht Readings from Last 1 Encounters:  01/20/24 5' 9 (1.753 m)    Weight:   Wt Readings from Last 1 Encounters:  01/21/24 70.2 kg    Ideal Body Weight:  65.9 kg  BMI:  Body mass index is 22.85 kg/m.  Estimated Nutritional Needs:   Kcal:  1750-1950  Protein:  85-100 grams  Fluid:  1.7-1.9 L    Margery ORN, RD, LDN, CDCES Registered Dietitian  III Certified Diabetes Care and Education Specialist If unable to reach this RD, please use RD Inpatient group chat on secure chat between hours of 8am-4 pm daily

## 2024-01-22 NOTE — Plan of Care (Signed)

## 2024-01-22 NOTE — Progress Notes (Signed)
 Daily Progress Note   Patient Name: Leslie Parker       Date: 01/22/2024 DOB: 1933-02-24  Age: 89 y.o. MRN#: [id number] Attending Physician: Jens Durand, MD Primary Care Physician: Sherial Bail, MD Admit Date: 01/20/2024  Reason for Consultation/Follow-up: Establishing goals of care  Subjective: Notes and labs reviewed.  Patient is currently resting in bed at this time, no family at bedside.  She states she is breathing much better than she was yesterday.  She states she is hopeful to discharge home tomorrow or the following day, but is happy to stay as long as needed.  She denies complaint or need at this time.  Length of Stay: 2  Current Medications: Scheduled Meds:   amiodarone   200 mg Oral Daily   apixaban   2.5 mg Oral BID   budesonide -glycopyrrolate -formoterol   2 puff Inhalation BID   carvedilol   6.25 mg Oral BID WC   colestipol   2 g Oral BID   empagliflozin   10 mg Oral Daily   furosemide   40 mg Intravenous BID   insulin  aspart  0-5 Units Subcutaneous QHS   insulin  aspart  0-6 Units Subcutaneous TID WC   isosorbide  mononitrate  30 mg Oral Daily   multivitamin with minerals  1 tablet Oral Daily   tamsulosin  0.4 mg Oral Daily    Continuous Infusions:  magnesium  sulfate bolus IVPB      PRN Meds: acetaminophen , hydrALAZINE , hyoscyamine, ondansetron   Physical Exam Pulmonary:     Effort: Pulmonary effort is normal.  Skin:    General: Skin is warm and dry.  Neurological:     Mental Status: She is alert.             Vital Signs: BP 129/60 (BP Location: Left Arm)   Pulse 65   Temp 97.8 F (36.6 C)   Resp 16   Ht 5' 9 (1.753 m)   Wt 70.2 kg   SpO2 99%   BMI 22.85 kg/m  SpO2: SpO2: 99 % O2 Device: O2 Device: Room Air O2 Flow Rate: O2 Flow Rate (L/min): 2  L/min  Intake/output summary:  Intake/Output Summary (Last 24 hours) at 01/22/2024 1456 Last data filed at 01/22/2024 1300 Gross per 24 hour  Intake 720 ml  Output 650 ml  Net 70 ml   LBM: Last BM Date :  01/19/24 Baseline Weight: Weight: 68.9 kg Most recent weight: Weight: 70.2 kg   Patient Active Problem List   Diagnosis Date Noted   Protein-calorie malnutrition, severe 01/22/2024   Acute on chronic systolic CHF (congestive heart failure) (HCC) 12/14/2023   Scapular fracture 11/26/2023   Acute hypoxic respiratory failure (HCC) 11/15/2023   Do not resuscitate 11/10/2023   CHF exacerbation (HCC) 11/10/2023   Myocardial injury 11/09/2023   AKI (acute kidney injury) (HCC) 11/05/2023   Insomnia 11/05/2023   Frequent hospital admissions 11/05/2023   Acute exacerbation of CHF (congestive heart failure) (HCC) 10/22/2023   Elevated troponin 10/22/2023   Malnutrition of moderate degree 09/17/2023   CHF (congestive heart failure) (HCC) 09/15/2023   Acute on chronic HFrEF (heart failure with reduced ejection fraction) (HCC) 08/19/2023   Pleural effusion due to CHF (congestive heart failure) (HCC) 08/19/2023   HTN (hypertension), malignant 08/19/2023   Ileus (HCC) 08/12/2023   Large bowel obstruction (HCC) 08/12/2023   CKD (chronic kidney disease) stage 4, GFR 15-29 ml/min (HCC) 08/12/2023   HFrEF (heart failure with reduced ejection fraction) (HCC) 08/12/2023   Acute on chronic combined systolic and diastolic CHF (congestive heart failure) (HCC) 08/10/2023   Hyponatremia 05/05/2023   Overweight (BMI 25.0-29.9) 05/01/2023   Left hip pain 05/01/2023   Hypokalemia 04/30/2023   Anemia 03/21/2023   Abnormal LFTs 03/20/2023   Type II diabetes mellitus with renal manifestations (HCC) 02/13/2023   Chronic diastolic CHF (congestive heart failure) (HCC) 02/13/2023   CKD stage 3 due to type 2 diabetes mellitus (HCC) 11/24/2022   Paroxysmal atrial fibrillation (HCC) 11/24/2022   Sick sinus  syndrome (HCC) 11/15/2022   Acute on chronic diastolic CHF (congestive heart failure) (HCC) 08/27/2022   COPD (chronic obstructive pulmonary disease) (HCC) 08/23/2022   Chronic a-fib (HCC) 08/22/2022   Diabetes mellitus without complication (HCC)    Stage 3b chronic kidney disease (HCC) - Baseline scr 1.8-2.0    Postural dizziness with presyncope    Hypomagnesemia    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  Continue to treat the treatable. PMT will shadow for needs  Code Status:    Code Status Orders  (From admission, onward)           Start     Ordered   01/20/24 1246  Do not attempt resuscitation (DNR)- Limited -Do Not Intubate (DNI)  Continuous       Question Answer Comment  If pulseless and not breathing No CPR or chest compressions.   In Pre-Arrest Conditions (Patient Is Breathing and Has A Pulse) Do not intubate. Provide all appropriate non-invasive medical interventions. Avoid ICU transfer unless indicated or required.   Consent: Discussion documented in EHR or advanced directives reviewed      01/20/24 1246           Code Status History     Date Active Date Inactive Code Status Order ID Comments User Context   12/14/2023 1049 12/16/2023 1750 Limited: Do not attempt resuscitation (DNR) -DNR-LIMITED -Do Not Intubate/DNI  [id number]  Sherlon Brayton RAMAN, MD ED   11/26/2023 2013 11/29/2023 1830 Limited: Do not attempt resuscitation (DNR) -DNR-LIMITED -Do Not Intubate/DNI  [id number]  Arnett Saunders, MD ED   11/15/2023 1442 11/17/2023 1927 Limited: Do not attempt resuscitation (DNR) -DNR-LIMITED -Do Not Intubate/DNI  [id number]  Eldonna Elspeth PARAS, MD ED  11/10/2023 1015 11/13/2023 0151 Limited: Do not attempt resuscitation (DNR) -DNR-LIMITED -Do Not Intubate/DNI  [id number]  Marsa Edelman, DO ED   11/09/2023 1823 11/10/2023 1015 Full Code [id number]  Hilma Rankins, MD ED   11/05/2023 1235 11/07/2023 2347 Full Code [id number]  Cox, Amy N, DO ED   10/22/2023 1300 10/24/2023 1831 Full Code [id number]  Cox, Amy N, DO ED   10/11/2023 1144 10/14/2023 1909 Full Code [id number]  Laurita Cort DASEN, MD ED   09/15/2023 0830 09/19/2023 2010 Full Code [id number]  Laurita Cort DASEN, MD ED   08/19/2023 2258 08/23/2023 2148 Full Code [id number]  Hilma Rankins, MD ED   08/12/2023 0927 08/16/2023 2054 Full Code [id number]  Eldonna Elspeth PARAS, MD ED   08/07/2023 1626 08/11/2023 1912 Full Code [id number]  Eldonna Elspeth PARAS, MD ED   06/16/2023 1227 06/19/2023 1856 Full Code [id number]  Laurita Cort DASEN, MD ED   06/04/2023 1527 06/07/2023 1840 Full Code [id number]  Eldonna Elspeth PARAS, MD ED   04/30/2023 1659 05/09/2023 2220 Full Code [id number]  Lanetta Lingo, MD ED   03/21/2023 0012 04/01/2023 1618 Full Code [id number]  Tobie Mario GAILS, MD ED   02/13/2023 0822 02/15/2023 1825 Full Code [id number]  Hilma Rankins, MD ED   11/24/2022 2339 11/29/2022 1924 Full Code [id number]  Lanetta Lingo, MD ED   11/15/2022 0928 11/16/2022 1952 Full Code [id number]  Ammon Marsa, MD Inpatient   08/22/2022 2153 08/27/2022 1916 Full Code [id number]  Tobie Mario GAILS, MD ED   06/05/2022 2039 06/07/2022 1712 Full Code [id number]  Cleatus Delayne GAILS, MD ED   09/03/2020 0433 09/05/2020 1806 Full Code [id number]  Mansy, Madison LABOR, MD ED   12/02/2017 1152 12/04/2017 1731 Full Code [id number]  Leora Lynwood SAUNDERS, MD Inpatient   01/25/2017 1402 01/28/2017 2028 Full Code [id number]  Yisroel Sleight, MD ED   01/17/2017 1444 01/18/2017 1754 Full Code [id number]  Marchia Drivers, MD Inpatient   02/04/2015 1534 02/10/2015 2036 Full Code [id number]  Marchia Drivers, MD Inpatient   02/03/2015 1751 02/04/2015 1534 Full Code [id number]  Marchia Drivers, MD Inpatient   02/03/2015 1749 02/03/2015 1750 Full Code [id number]  Marchia Drivers, MD Inpatient   02/03/2015 1338 02/03/2015 1749 Full Code [id number]  Yisroel Sleight,  MD ED    Thank you for allowing the Palliative Medicine Team to assist in the care of this patient.    [name] Lewis, NP  Please contact Palliative Medicine Team phone at [phone] for questions and concerns.

## 2024-01-22 NOTE — Discharge Instructions (Signed)
Heart Failure Nutrition Therapy For The Undernourished  This nutrition therapy will help you eat more calories and protein in addition to helping your heart. These suggestions can help you if you can't eat enough, have lost weight, or need extra calories and protein in your diet. This plan focuses on: Eating more calories.  Eating more calories can give you more energy, help you gain weight, and promote wound healing. Eating more protein. Protein can help you heal, give you energy, and build and repair muscle. Ask your registered dietitian nutritionist (RDN) how much protein is the right amount for you. Eating a low-sodium diet while increasing your calorie and protein intake. This will help control buildup of fluids around your heart, stomach, lungs, and legs. Too much sodium may make your blood pressure too high and put stress on your heart. You can achieve these goals by: Eating more calories and protein. Eating less than 2,000 milligrams of sodium per day. Reading food labels to keep track of how much sodium, protein, and calories are in the foods you eat. Limiting fluid intake if your doctor has asked you to follow a fluid restriction. Reading the Food Label: How Much Sodium Is too Much? The nutrition plan for heart failure usually limits the sodium that you get from food and beverages to 2,000 milligrams per day. Salt is the main source of sodium. Read the nutrition label to find out how much sodium is in 1 serving. Foods with more than 300 milligrams of sodium per serving may not fit into a reduced-sodium meal plan. Check serving sizes on the label. If you eat more than 1 serving, you will get more sodium than the amount listed. You can find protein and calories on the Nutrition Facts label too. Eating More Protein and Calories Eat at least 6 small meals throughout the day. If you become full quickly after you begin eating, you may benefit from small, frequent meals instead of 3 large  meals. Keep high-calorie and high-protein snacks available in your car or bag for when you get hungry. Add extra calories and protein when cooking meals. Cook with unsalted butter, margarine, or oils instead of calorie-free cooking spray. Use reduced-fat (2%) milk instead of fat-free (skim) milk. Ask your RDN if whole milk is recommended for you. Add milk powder to protein shakes, cereal, or casseroles. Sprinkle unsalted nuts and seeds into your cereal, stir-fry, or salad. Add 1 tablespoon mayonnaise, sugar, or honey to foods (adds 50 to 100 calories). Add calories and protein to your fruits and vegetables. Fruits are low in calories. Add peanut butter, yogurt, or cottage cheese to add calories and protein. Buy fruit cups canned in syrup instead of water or juice. Vegetables are also low in calories. Add butter, sour cream, margarine, or oil for extra calories. Potatoes, corn, and peas are higher in calories than nonstarchy vegetables. Avocados are rich in calories and healthy fat. Eat them alone or use them as a topping or spread. Limit foods that have little to no nutrition. Eat fewer calorie-free and low-calorie foods such as applesauce, Jell-O, and diet soft drinks. These foods will take up space in your stomach and may leave little room for higher-calorie foods and beverages.  When shopping, avoid products that say "low calorie" or "low fat." Drink high-calorie beverages. There are several liquid nutrition supplements available that also have less than 300 milligrams of sodium per serving. Drink them between meals as snacks.  Make your own supplement at home with milk or ice   cream.  Ask your RDN if protein powder would also be a good addition. Milk and juice are high in calories. Limit plain tea, coffee, and water. These have no calories.  Foods Recommended Food Group Foods Recommended  Grains Whole wheat bread with less than 80 milligrams of sodium per slice  Many cold cereals,  especially shredded wheat and granola Oats or cream of wheat made with milk (add butter or margarine, sugar, dried fruit, and nuts for more calories and fat) Quinoa Wheat germ (sprinkle on soups, baked goods, or cereal)  Vegetables Fresh and frozen vegetables without added sauces, salt, or sodium Homemade soups (salt free or low sodium) with dry milk powder or cream Potatoes, corn, and peas  Fruits Canned fruits (canned in heavy syrup) Dried fruits, such as raisins, cranberries, and prunes Avocados  Dairy (Milk and Milk Products) Milk or milk powder Soy milk Yogurt, including Greek yogurt Small amounts of natural, blocked cheeses or reduced-sodium cheese (Swiss, ricotta, and fresh mozzarella are lower in sodium than others) Regular or soft cream cheese and low-sodium cottage cheese  Protein Foods (Meat, Poultry, Fish, and Beans) Fresh meat and fish Turkey bacon (check the Nutrition Facts label to make sure its not packaged in a sodium solution) Tuna canned or packed in oil Dried beans, peas, and legumes; edamame (fresh soybeans) Eggs Unsalted nuts or peanut butter  Desserts and Snacks Apple (with peanut butter); baked apple with sugar and butter             Granola bars Pudding made with reduced-fat (2%) milk or milk powder Smoothies  Custard Chocolate syrup added to milk or ice cream  Fats Tub or liquid margarine (trans fat-free) Butter (check with your doctor or RDN first) Unsaturated fat oils (canola, olive, corn, sunflower, safflower, peanut, vegetable, and soybean) Flaxseed (oil or ground)  Sodium-Free Condiments Fresh or dried herbs; pepper; vinegar; lemon juice or lime juice; salt-free seasoning mixes and marinades such as Mrs. Dash or McCormick's salt-free blend; simple salad dressings such as vinegar and oil; low-sodium ketchup. You can purchase salt-free barbecue sauce and many others on the Internet.  Ask your RDN.     Foods Not Recommended Food Group Foods Not  Recommended  Grains Breads or crackers topped with salt Cereals (hot/cold) with more than 300 milligrams sodium per serving Biscuits, cornbread, and other "quick" breads prepared with baking soda Prepackaged bread crumbs Self-rising flours  Vegetables Canned vegetables (unless they are salt free or low sodium) Frozen vegetables with high-sodium seasoning and sauces Sauerkraut and pickled vegetables Canned or dried soups (unless they are salt free or low  sodium) French fries and onion rings Broth-based soups  Fruits Dried fruits preserved with sodium-containing additives  Dairy (Milk and Milk Products) Buttermilk Processed cheeses such as Cheese Wiz, Velveeta, and Queso Feta cheese Shredded cheese (has more sodium than blocked cheeses) "Singles" cheese slices and string cheese  Protein Foods (Meat, Poultry, Fish, and Beans) Cured meats (bacon, ham, sausage, pepperoni, and hot dogs)  Canned meats (chili, Vienna sausage, sardines, and Spam) Smoked fish and meats Frozen meals with more than 600 milligrams of sodium Egg Beaters   Fats Salted butter or margarine  Condiments Salt, sea salt, kosher salt, onion salt, and garlic salt Seasoning mixes containing salt such as Lemon Pepper or Lawry's Bouillon cubes Catsup or ketchup Barbecue sauce and Worcestershire sauce Soy sauce Salsa, pickles, olives, relish Salad dressings: ranch, blue cheese, Italian, and French  Alcohol Check with your doctor.   Heart Failure (  Undernourished) Sample 1-Day Menu View Nutrient Info Breakfast 1 cup oatmeal made with milk 1 tablespoon wheat germ (sprinkle on oatmeal) 1 cup (240 milliliters) reduced-fat (2%) milk 2 tablespoons chocolate syrup (add to milk) 1 banana 2 tablespoons peanut butter (for banana)  Morning Snack 1/4 cup raw almonds Cranberries (add to almonds) 1 cup (240 milliliters) oral nutritional supplement  Lunch 3 ounces grilled chicken breast 1 small potato 2 ounces cheese (to add to  potato) 2 tablespoons margarine (to add to potato) Croutons (add to salad) Walnuts (add to salad) Vinegar and oil dressing (add to salad) 1 cup (240 milliliters) juice  Afternoon Snack 10 tortilla chips Guacamole (avocado, lime juice, onion, and tomatoes, and pepper) 1 cup (240 milliliters) water or juice  Evening Meal 3 ounces herb-baked fish 1 cup homemade mashed potatoes with margarine (trans fat-free) and milk 1/2 cup creamed spinach 1 cup (240 milliliters) water or juice  Evening Snack 1 cup (240 milliliters) homemade milkshake 1 slice low-sodium turkey breast 1 slice whole wheat bread 1 slice cheese Mayonnaise  Daily Sum Nutrient Unit Value  Macronutrients  Energy kcal 3461  Energy kJ 14480  Protein g 151  Total lipid (fat) g 158  Carbohydrate, by difference g 381  Fiber, total dietary g 36  Sugars, total g 163  Minerals  Calcium, Ca mg 2598  Iron, Fe mg 32  Sodium, Na mg 2530  Vitamins  Vitamin C, total ascorbic acid mg 180  Vitamin A, IU IU 9009  Vitamin D IU 575  Lipids  Fatty acids, total saturated g 43  Fatty acids, total monounsaturated g 56  Fatty acids, total polyunsaturated g 48  Cholesterol mg 252     Heart Failure (Undernourished) Vegan Sample 1-Day Menu View Nutrient Info Breakfast 1 cup oatmeal made with: 1 cup soymilk fortified with calcium, vitamin B12, and vitamin D 2 tablespoons walnuts, unsalted 1 banana 1 cup orange juice with added calcium and vitamin D  Morning Snack  cup almond butter, unsalted 1 apple  cup granola  cup soy yogurt  Lunch 1 black bean burger, low sodium 1 hamburger bun 1 cup lettuce for salad with:  cup cucumbers, sliced  cup carrots, shredded 1 tablespoon cashews, unsalted 1 tablespoon sesame seed dressing, low sodium  cup pineapple  Afternoon Snack 1 pita, whole wheat  cup hummus  cup grapes  Evening Meal 1 cup cooked rice 1 cup red beans, cooked 1 cup corn, cooked  cup cucumber slices  cup  tomato, diced  Evening Snack Smoothie made with: 1 cup soymilk fortified with calcium, vitamin B12, and vitamin D 1 scoop soy protein powder, for smoothie 1 cup strawberries, for smoothie  Daily Sum Nutrient Unit Value  Macronutrients  Energy kcal 2831  Energy kJ 11850  Protein g 121  Total lipid (fat) g 85  Carbohydrate, by difference g 428  Fiber, total dietary g 71  Sugars, total g 154  Minerals  Calcium, Ca mg 2162  Iron, Fe mg 45  Sodium, Na mg 2054  Vitamins  Vitamin C, total ascorbic acid mg 296  Vitamin A, IU IU 17869  Vitamin D IU 318  Lipids  Fatty acids, total saturated g 9  Fatty acids, total monounsaturated g 36  Fatty acids, total polyunsaturated g 30  Cholesterol mg 0     Heart Failure (Undernourished) Vegetarian (Lacto-Ovo) Sample 1-Day Menu View Nutrient Info Breakfast 1 cup oatmeal made with: 1 cup 2% milk 1 slice toast, whole wheat topped with:  cup   avocado 1 cup orange juice with added calcium and vitamin D  Morning Snack  cup almonds, unsalted 2 tablespoons dried cranberries (add to almonds) 1 cup Greek yogurt, plain  Lunch Sandwich made with: 2 slices bread, whole wheat 2 tablespoons peanut butter, unsalted 1 tablespoon jam 4 carrot sticks with: 2 tablespoons hummus 1 banana  Afternoon Snack  cup granola  cup peanuts, unsalted 1 apple  Evening Meal 1 cup black bean soup, low sodium  cup tofu, cooked  cup cooked brown rice  cup broccoli, cooked  cup carrots, cooked  cup onions, cooked 2 tablespoons peanut oil  Evening Snack 5 crackers, whole wheat, low sodium 3 dried figs 1 cup 2% milk  Daily Sum Nutrient Unit Value  Macronutrients  Energy kcal 3020  Energy kJ 12636  Protein g 117  Total lipid (fat) g 133  Carbohydrate, by difference g 375  Fiber, total dietary g 58  Sugars, total g 148  Minerals  Calcium, Ca mg 2197  Iron, Fe mg 29  Sodium, Na mg 1434  Vitamins  Vitamin C, total ascorbic acid mg 174  Vitamin A, IU  IU 20295  Vitamin D IU 317  Lipids  Fatty acids, total saturated g 27  Fatty acids, total monounsaturated g 62  Fatty acids, total polyunsaturated g 34  Cholesterol mg 63    Copyright 2020  [employer]. All rights reserved  

## 2024-01-23 ENCOUNTER — Other Ambulatory Visit: Payer: Self-pay

## 2024-01-23 DIAGNOSIS — E119 Type 2 diabetes mellitus without complications: Secondary | ICD-10-CM | POA: Diagnosis not present

## 2024-01-23 DIAGNOSIS — I5043 Acute on chronic combined systolic (congestive) and diastolic (congestive) heart failure: Secondary | ICD-10-CM | POA: Diagnosis not present

## 2024-01-23 DIAGNOSIS — J9601 Acute respiratory failure with hypoxia: Secondary | ICD-10-CM | POA: Diagnosis not present

## 2024-01-23 DIAGNOSIS — J449 Chronic obstructive pulmonary disease, unspecified: Secondary | ICD-10-CM | POA: Diagnosis not present

## 2024-01-23 LAB — BASIC METABOLIC PANEL WITH GFR
Anion gap: 11 (ref 5–15)
BUN: 69 mg/dL — ABNORMAL HIGH (ref 8–23)
CO2: 26 mmol/L (ref 22–32)
Calcium: 8.2 mg/dL — ABNORMAL LOW (ref 8.9–10.3)
Chloride: 98 mmol/L (ref 98–111)
Creatinine, Ser: 2.46 mg/dL — ABNORMAL HIGH (ref 0.44–1.00)
GFR, Estimated: 18 mL/min — ABNORMAL LOW (ref >60)
Glucose, Bld: 131 mg/dL — ABNORMAL HIGH (ref 70–99)
Potassium: 4.4 mmol/L (ref 3.5–5.1)
Sodium: 135 mmol/L (ref 135–145)

## 2024-01-23 LAB — GLUCOSE, CAPILLARY
Glucose-Capillary: 139 mg/dL — ABNORMAL HIGH (ref 70–99)
Glucose-Capillary: 144 mg/dL — ABNORMAL HIGH (ref 70–99)

## 2024-01-23 LAB — MAGNESIUM: Magnesium: 2 mg/dL (ref 1.7–2.4)

## 2024-01-23 MED ORDER — TORSEMIDE 20 MG PO TABS
40.0000 mg | ORAL_TABLET | Freq: Every morning | ORAL | Status: DC
  Administered 2024-01-23: 40 mg via ORAL
  Filled 2024-01-23: qty 2

## 2024-01-23 MED ORDER — LOSARTAN POTASSIUM 25 MG PO TABS
12.5000 mg | ORAL_TABLET | Freq: Every day | ORAL | 0 refills | Status: DC
  Filled 2024-01-23: qty 30, 60d supply, fill #0

## 2024-01-23 MED ORDER — TORSEMIDE 20 MG PO TABS: 20.0000 mg | ORAL_TABLET | Freq: Every evening | ORAL | Status: DC

## 2024-01-23 MED ORDER — CARVEDILOL 6.25 MG PO TABS
6.2500 mg | ORAL_TABLET | Freq: Two times a day (BID) | ORAL | 0 refills | Status: DC
Start: 2024-01-23 — End: 2024-04-16
  Filled 2024-01-23: qty 60, 30d supply, fill #0

## 2024-01-23 MED ORDER — TORSEMIDE 20 MG PO TABS
ORAL_TABLET | ORAL | 0 refills | Status: DC
  Filled 2024-01-23 (×2): qty 60, 20d supply, fill #0

## 2024-01-23 NOTE — TOC Transition Note (Signed)
 Transition of Care [facility]) - Discharge Note   Patient Details  Name: Leslie Parker MRN: [id number] Date of Birth: Feb 04, 1933  [facility] CM/SW Contact:  Alpheus Stiff C Jahmai Finelli, RN Phone Number: 01/23/2024, 1:08 PM   Clinical Narrative:    Spoke with patient regarding discharge plan. She is active with [facility] and was advised they will contact her regarding her resumption on care. Patient's granddaughter, [name] will transport patient home today.  Larraine from [facility] notified of discharge.   TOC signing off.          Patient Goals and CMS Choice            Discharge Placement                       Discharge Plan and Services Additional resources added to the After Visit Summary for                                       Social Drivers of Health (SDOH) Interventions SDOH Screenings   Food Insecurity: No Food Insecurity (01/20/2024)  Housing: Low Risk  (01/20/2024)  Transportation Needs: No Transportation Needs (01/20/2024)  Utilities: Not At Risk (01/20/2024)  Alcohol Screen: Low Risk  (08/20/2023)  Financial Resource Strain: Low Risk  (01/13/2024)   Received from [facility]  Social Connections: Moderately Isolated (01/20/2024)  Tobacco Use: Low Risk  (01/20/2024)     Readmission Risk Interventions    10/22/2023    2:28 PM 09/16/2023    2:52 PM 08/20/2023    3:45 PM  Readmission Risk Prevention Plan  Transportation Screening Complete Complete Complete  Medication Review Oceanographer) Complete Complete Complete  PCP or Specialist appointment within 3-5 days of discharge  Complete Complete  HRI or Home Care Consult Not Complete Patient refused Complete  HRI or Home Care Consult Pt Refusal Comments No PT recs at this time.    SW Recovery Care/Counseling Consult Complete Complete Complete  Palliative Care Screening Not Applicable Not Applicable Not Applicable  Skilled Nursing Facility Not Applicable Not Applicable Not  Applicable

## 2024-01-23 NOTE — Progress Notes (Signed)
 [facility] CLINIC CARDIOLOGY PROGRESS NOTE       Patient ID: Leslie Parker MRN: [id number] DOB/AGE: 1933-06-03 89 y.o.  Admit date: 01/20/2024 Referring Physician Dr. Caleen Primary Physician Sherial Bail, MD Primary Cardiologist Dr. Florencio Reason for Consultation AoCHFrEF (BNP > 4500)  HPI: Leslie Parker is a 89 y.o. female  with a past medical history of chronic HFrEF (EF 25-30% - 07/2023), paroxymal atrial fibrillation (on Eliquis , PO amio), SSS s/p [employer] dual chamber PPM (11/15/2022), hypertension,  type 2 diabetes mellitus, severe COPD stage 3 by GOLD classification, CKG stage 4 who presented to the ED on 01/20/2024 for worsening dyspnea and orthopnea. Of note patient is frequently hospitalized for acute on chronic heart failure, most recently 11/2023. Cardiology was consulted for further evaluation for acute on chronic HFrEF with BNP > 4500.  Interval History: -Patient seen and examined this AM and laying comfortably in hospital bed at a slight incline. Patient states she feels good this AM and SOB is better and at baseline. Continues to deny chest pain. -Patients BP borderline and HR stable this AM. Overnight Tele showed no significant events.  -UOP yesterday 1.5L with bump in Cr 2.21 > 2.46.   -Electrolytes are stable.  -Patient weaned to RA with stable SpO2. (No O2 requirement at home).  -Patient eager to go home.   Review of systems complete and found to be negative unless listed above    Past Medical History:  Diagnosis Date   Acute kidney injury superimposed on CKD (HCC) 11/24/2022   Asthma    Atrial fibrillation with RVR (HCC)    Cancer (HCC)    Basal Cell   CHF (congestive heart failure) (HCC)    Closed left hip fracture (HCC) 01/25/2017   Closed right hip fracture (HCC) 02/13/2023   Diabetes mellitus without complication (HCC)    Femur fracture, left (HCC) 02/03/2015   Heart murmur    Hypertension    Impetigo    Osteoarthritis    Osteopenia     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   Rapid atrial fibrillation, new onset(HCC) 06/05/2022   Vomiting    can not due to surgery   Wears dentures    full upper and lower    Past Surgical History:  Procedure Laterality Date   ABDOMINAL HYSTERECTOMY     BACK SURGERY     BLADDER SURGERY     mesh   CARPAL TUNNEL RELEASE Bilateral    CATARACT EXTRACTION Right 2017   CATARACT EXTRACTION W/PHACO Left 02/06/2016   Procedure: CATARACT EXTRACTION PHACO AND INTRAOCULAR LENS PLACEMENT (IOC) left eye;  Surgeon: Donzell Arlyce Budd, MD;  Location: [facility];  Service: Ophthalmology;  Laterality: Left;  DIABETIC LEFT Cannot arrive before 9:30   CERVICAL DISC SURGERY     CHOLECYSTECTOMY     EYE SURGERY Bilateral    Cataract Extraction with IOL   FEMUR IM NAIL Left 02/04/2015   Procedure: INTRAMEDULLARY (IM) NAIL FEMORAL;  Surgeon: Franky Cranker, MD;  Location: [facility];  Service: Orthopedics;  Laterality: Left;   HARDWARE REMOVAL Left 01/17/2017   Procedure: HARDWARE REMOVAL;  Surgeon: Cranker Franky, MD;  Location: [facility];  Service: Orthopedics;  Laterality: Left;   HERNIA REPAIR  2014   esophageal and gastric mesh. patient unable to throw up d/t mesh   HIP ARTHROPLASTY Left 01/26/2017   Procedure: ARTHROPLASTY BIPOLAR HIP (HEMIARTHROPLASTY) removal hardware left hip;  Surgeon: Cleotilde Barrio, MD;  Location: [facility];  Service: Orthopedics;  Laterality: Left;  INTRAMEDULLARY (IM) NAIL INTERTROCHANTERIC Right 02/13/2023   Procedure: INTRAMEDULLARY (IM) NAIL INTERTROCHANTERIC;  Surgeon: Edie Norleen PARAS, MD;  Location: [facility];  Service: Orthopedics;  Laterality: Right;   JOINT REPLACEMENT Bilateral    knees   PACEMAKER IMPLANT N/A 11/15/2022   Procedure: PACEMAKER IMPLANT;  Surgeon: Ammon Blunt, MD;  Location: [facility];  Service: Cardiovascular;  Laterality: N/A;   PACEMAKER IMPLANT N/A 11/28/2022   Procedure: PACEMAKER IMPLANT;  Surgeon: Ammon Blunt,  MD;  Location: [facility];  Service: Cardiovascular;  Laterality: N/A;  Lead reposition   REPLACEMENT TOTAL KNEE BILATERAL Bilateral [id number]   SHOULDER ARTHROSCOPY WITH OPEN ROTATOR CUFF REPAIR AND DISTAL CLAVICLE ACROMINECTOMY Left 10/25/2016   Procedure: SHOULDER ARTHROSCOPY WITH OPEN ROTATOR CUFF REPAIR AND DISTAL CLAVICLE ACROMINECTOMY;  Surgeon: Franky Cranker, MD;  Location: [facility];  Service: Orthopedics;  Laterality: Left;   THYROID  SURGERY     goiter removed   TOTAL HIP REVISION Left 12/02/2017   Procedure: TOTAL HIP REVISION;  Surgeon: Leora Lynwood SAUNDERS, MD;  Location: [facility];  Service: Orthopedics;  Laterality: Left;   TOTAL SHOULDER REPLACEMENT Right 2012    Medications Prior to Admission  Medication Sig Dispense Refill Last Dose/Taking   acetaminophen  (TYLENOL ) 500 MG tablet Take 2 tablets (1,000 mg total) by mouth 3 (three) times daily.   01/19/2024   albuterol  (VENTOLIN  HFA) 108 (90 Base) MCG/ACT inhaler Inhale 2 puffs into the lungs every 6 (six) hours as needed.   Unknown   alum & mag hydroxide-simeth (MAALOX/MYLANTA) 200-200-20 MG/5ML suspension Take 30 mLs by mouth every 6 (six) hours as needed for indigestion or heartburn.   Unknown   amiodarone  (PACERONE ) 200 MG tablet Take 1 tablet by mouth daily.   01/19/2024 Noon   apixaban  (ELIQUIS ) 2.5 MG TABS tablet Take 1 tablet (2.5 mg total) by mouth 2 (two) times daily. 60 tablet 1 01/19/2024 Noon   ascorbic acid  (VITAMIN C ) 250 MG tablet Take 250 mg by mouth daily.   01/19/2024 Noon   Cholecalciferol  (VITAMIN D -1000 MAX ST) 25 MCG (1000 UT) tablet Take 1,000 Units by mouth daily.   01/19/2024 Noon   colestipol  (COLESTID ) 1 g tablet Take 2 g by mouth.  Take 2 g by mouth 2 (two) times daily   01/19/2024 Noon   Cyanocobalamin  (B-12) 2500 MCG TABS Take 2,500 mcg by mouth daily.   01/19/2024   isosorbide  mononitrate (IMDUR ) 30 MG 24 hr tablet Take 30 mg by mouth.  Take 30 mg by mouth at bedtime   01/19/2024 Evening   JARDIANCE  10  MG TABS tablet    Past Week   lisinopril  (ZESTRIL ) 5 MG tablet Take 5 mg by mouth daily.   01/19/2024 Noon   Multiple Vitamin (MULTIVITAMIN WITH MINERALS) TABS tablet Take 1 tablet by mouth daily. One-A-Day Women's Vitamin   01/19/2024 Noon   ondansetron  (ZOFRAN ) 4 MG tablet Take 1 tablet (4 mg total) by mouth every 6 (six) hours as needed for nausea. 20 tablet 0 Unknown   polyethylene glycol (MIRALAX  / GLYCOLAX ) 17 g packet Take 17 g by mouth daily as needed.   Unknown   senna-docusate (SENOKOT-S) 8.6-50 MG tablet Take 2 tablets by mouth at bedtime as needed for mild constipation.   Unknown   torsemide  (DEMADEX ) 20 MG tablet Take 1 tablet (20 mg total) by mouth 2 (two) times daily.   01/19/2024 Noon   TRELEGY ELLIPTA 100-62.5-25 MCG/ACT AEPB Inhale 1 puff into the lungs daily.   01/20/2024 Morning  ipratropium-albuterol  (DUONEB) 0.5-2.5 (3) MG/3ML SOLN Inhale 3 mLs into the lungs every 6 (six) hours as needed. (Patient not taking: Reported on 01/20/2024)   Not Taking   oxyCODONE  (OXY IR/ROXICODONE ) 5 MG immediate release tablet Take 2.5 mg by mouth every 6 (six) hours as needed for severe pain (pain score 7-10). (Patient not taking: Reported on 01/20/2024)   Not Taking   Social History   Socioeconomic History   Marital status: Divorced    Spouse name: Not on file   Number of children: 4   Years of education: Not on file   Highest education level: 12th grade  Occupational History   Occupation: Retitred/Disability  Tobacco Use   Smoking status: Never   Smokeless tobacco: Never  Vaping Use   Vaping status: Never Used  Substance and Sexual Activity   Alcohol use: No    Alcohol/week: 0.0 standard drinks of alcohol   Drug use: No   Sexual activity: Not Currently  Other Topics Concern   Not on file  Social History Narrative   Not on file   Social Drivers of Health   Financial Resource Strain: Low Risk  (01/13/2024)   Received from [facility]   Overall Financial Resource  Strain (CARDIA)    Difficulty of Paying Living Expenses: Not hard at all  Food Insecurity: No Food Insecurity (01/20/2024)   Hunger Vital Sign    Worried About Running Out of Food in the Last Year: Never true    Ran Out of Food in the Last Year: Never true  Transportation Needs: No Transportation Needs (01/20/2024)   PRAPARE - Administrator, Civil Service (Medical): No    Lack of Transportation (Non-Medical): No  Physical Activity: Not on file  Stress: Not on file  Social Connections: Moderately Isolated (01/20/2024)   Social Connection and Isolation Panel    Frequency of Communication with Friends and Family: More than three times a week    Frequency of Social Gatherings with Friends and Family: Three times a week    Attends Religious Services: 1 to 4 times per year    Active Member of Clubs or Organizations: No    Attends Banker Meetings: Never    Marital Status: Divorced  Catering manager Violence: Not At Risk (01/20/2024)   Humiliation, Afraid, Rape, and Kick questionnaire    Fear of Current or Ex-Partner: No    Emotionally Abused: No    Physically Abused: No    Sexually Abused: No    Family History  Problem Relation Age of Onset   Heart failure Mother    Hypertension Mother    Emphysema Father      Vitals:   01/22/24 2040 01/23/24 0000 01/23/24 0358 01/23/24 0700  BP: (!) 109/47 (!) 111/59 (!) 119/57 110/60  Pulse: 61 (!) 59 (!) 59 60  Resp: 17 17 17    Temp: 98.7 F (37.1 C) 98.7 F (37.1 C) 98.6 F (37 C) 98.4 F (36.9 C)  TempSrc:    Oral  SpO2: 92% 92% 94% 93%  Weight:      Height:        PHYSICAL EXAM General: Chronically ill appearing female, well nourished, in no acute distress. HEENT: Normocephalic and atraumatic. Neck: No JVD.   Lungs: Normal respiratory effort on room air. Diminished breath sounds at bases. Heart: HRRR. Normal S1 and S2 without gallops or murmurs.  Abdomen: Non-distended appearing.  Msk: Normal strength and  tone for age. Extremities: Warm and  well perfused. No clubbing, cyanosis, edema.  Neuro: Alert and oriented X 3. Psych: Answers questions appropriately.   Labs: Basic Metabolic Panel: Recent Labs    01/22/24 0439 01/23/24 0312  NA 138 135  K 4.4 4.4  CL 103 98  CO2 27 26  GLUCOSE 117* 131*  BUN 62* 69*  CREATININE 2.21* 2.46*  CALCIUM 8.1* 8.2*  MG 1.7 2.0   Liver Function Tests: No results for input(s): AST, ALT, ALKPHOS, BILITOT, PROT, ALBUMIN  in the last 72 hours.  No results for input(s): LIPASE, AMYLASE in the last 72 hours. CBC: Recent Labs    01/21/24 0250  WBC 6.3  HGB 10.6*  HCT 31.4*  MCV 93.7  PLT 254   Cardiac Enzymes: Recent Labs    01/20/24 1051  TROPONINIHS 46*   BNP: No results for input(s): BNP in the last 72 hours.  D-Dimer: No results for input(s): DDIMER in the last 72 hours. Hemoglobin A1C: No results for input(s): HGBA1C in the last 72 hours. Fasting Lipid Panel: No results for input(s): CHOL, HDL, LDLCALC, TRIG, CHOLHDL, LDLDIRECT in the last 72 hours. Thyroid  Function Tests: No results for input(s): TSH, T4TOTAL, T3FREE, THYROIDAB in the last 72 hours.  Invalid input(s): FREET3 Anemia Panel: No results for input(s): VITAMINB12, FOLATE, FERRITIN, TIBC, IRON, RETICCTPCT in the last 72 hours.   Radiology: ECHOCARDIOGRAM COMPLETE Result Date: 01/21/2024    ECHOCARDIOGRAM REPORT   Patient Name:   Leslie Parker Duchene Date of Exam: 01/21/2024 Medical Rec #:  [id number]     Height:       69.0 in Accession #:    [id number]    Weight:       154.8 lb Date of Birth:  1932/10/03      BSA:          1.853 m Patient Age:    89 years      BP:           130/64 mmHg Patient Gender: F             HR:           71 bpm. Exam Location:  [facility] Procedure: 2D Echo, Cardiac Doppler, Color Doppler and Strain Analysis (Both            Spectral and Color Flow Doppler were utilized during procedure). Indications:      Abnormal ECG R94.31  History:         Patient has prior history of Echocardiogram examinations, most                  recent 08/09/2023. CHF, Pacemaker; Risk Factors:Hypertension and                  Diabetes.  Sonographer:     Christopher Furnace Referring Phys:  [id number] SUMAYYA AMIN Diagnosing Phys: Keller Paterson  Sonographer Comments: Global longitudinal strain was attempted. IMPRESSIONS  1. Left ventricular ejection fraction, by estimation, is 25 to 30%. The left ventricle has severely decreased function. The left ventricle demonstrates regional wall motion abnormalities (see scoring diagram/findings for description). There is mild left  ventricular hypertrophy. Left ventricular diastolic parameters are consistent with Grade II diastolic dysfunction (pseudonormalization).  2. Right ventricular systolic function is normal. The right ventricular size is normal.  3. Left atrial size was mildly dilated.  4. Mild mitral valve regurgitation.  5. The aortic valve is tricuspid. Aortic valve regurgitation is not visualized. Mild aortic valve stenosis. FINDINGS  Left Ventricle: Left ventricular ejection  fraction, by estimation, is 25 to 30%. The left ventricle has severely decreased function. The left ventricle demonstrates regional wall motion abnormalities. The left ventricular internal cavity size was normal  in size. There is mild left ventricular hypertrophy. Left ventricular diastolic parameters are consistent with Grade II diastolic dysfunction (pseudonormalization).  LV Wall Scoring: The mid and distal anterior wall, mid and distal lateral wall, mid and distal anterior septum, entire apex, mid and distal inferior wall, mid anterolateral segment, and mid inferoseptal segment are hypokinetic. The basal anteroseptal segment, basal inferolateral segment, basal anterolateral segment, basal anterior segment, basal inferior segment, and basal inferoseptal segment are normal. Right Ventricle: The right ventricular size is normal.  No increase in right ventricular wall thickness. Right ventricular systolic function is normal. Left Atrium: Left atrial size was mildly dilated. Right Atrium: Right atrial size was normal in size. Pericardium: There is no evidence of pericardial effusion. Mitral Valve: There is moderate thickening of the mitral valve leaflet(s). Mild mitral annular calcification. Mild mitral valve regurgitation. MV peak gradient, 8.8 mmHg. The mean mitral valve gradient is 4.0 mmHg. Tricuspid Valve: The tricuspid valve is normal in structure. Tricuspid valve regurgitation is mild. Aortic Valve: The aortic valve is tricuspid. Aortic valve regurgitation is not visualized. Mild aortic stenosis is present. Aortic valve mean gradient measures 12.0 mmHg. Aortic valve peak gradient measures 21.7 mmHg. Aortic valve area, by VTI measures 1.27 cm. Pulmonic Valve: The pulmonic valve was not well visualized. Pulmonic valve regurgitation is not visualized. Aorta: The aortic root is normal in size and structure. IAS/Shunts: The atrial septum is grossly normal.  LEFT VENTRICLE PLAX 2D LVIDd:         4.30 cm      Diastology LVIDs:         3.80 cm      LV e' medial:    3.70 cm/s LV PW:         1.30 cm      LV E/e' medial:  23.2 LV IVS:        1.40 cm      LV e' lateral:   3.81 cm/s LVOT diam:     2.00 cm      LV E/e' lateral: 22.5 LV SV:         61 LV SV Index:   33 LVOT Area:     3.14 cm  LV Volumes (MOD) LV vol d, MOD A2C: 148.0 ml LV vol d, MOD A4C: 123.0 ml LV vol s, MOD A2C: 92.2 ml LV vol s, MOD A4C: 91.5 ml LV SV MOD A2C:     55.8 ml LV SV MOD A4C:     123.0 ml LV SV MOD BP:      46.2 ml RIGHT VENTRICLE RV Basal diam:  2.90 cm RV Mid diam:    2.30 cm RV S prime:     9.68 cm/s TAPSE (M-mode): 1.9 cm LEFT ATRIUM             Index        RIGHT ATRIUM           Index LA diam:        3.70 cm 2.00 cm/m   RA Area:     10.50 cm LA Vol (A2C):   53.7 ml 28.98 ml/m  RA Volume:   21.00 ml  11.33 ml/m LA Vol (A4C):   43.4 ml 23.43 ml/m LA Biplane  Vol: 51.1 ml 27.58 ml/m  AORTIC VALVE AV Area (Vmax):  1.25 cm AV Area (Vmean):   1.26 cm AV Area (VTI):     1.27 cm AV Vmax:           232.67 cm/s AV Vmean:          159.667 cm/s AV VTI:            0.477 m AV Peak Grad:      21.7 mmHg AV Mean Grad:      12.0 mmHg LVOT Vmax:         92.60 cm/s LVOT Vmean:        64.200 cm/s LVOT VTI:          0.193 m LVOT/AV VTI ratio: 0.40  AORTA Ao Root diam: 3.50 cm MITRAL VALVE                TRICUSPID VALVE MV Area (PHT): 2.88 cm     TR Peak grad:   28.7 mmHg MV Area VTI:   1.64 cm     TR Vmax:        268.00 cm/s MV Peak grad:  8.8 mmHg MV Mean grad:  4.0 mmHg     SHUNTS MV Vmax:       1.48 m/s     Systemic VTI:  0.19 m MV Vmean:      89.4 cm/s    Systemic Diam: 2.00 cm MV Decel Time: 263 msec MV E velocity: 85.90 cm/s MV A velocity: 130.00 cm/s MV E/A ratio:  0.66 Keller Paterson Electronically signed by Keller Paterson Signature Date/Time: 01/21/2024/12:48:16 PM    Final    DG Chest Portable 1 View Result Date: 01/20/2024 CLINICAL DATA:  Shortness of breath. EXAM: PORTABLE CHEST 1 VIEW COMPARISON:  Chest radiograph dated 12/14/2023. FINDINGS: Cardiomegaly with vascular congestion and edema. Pneumonia is not excluded. No large pleural effusion. No pneumothorax. Atherosclerotic calcification of the aorta left pectoral pacemaker device. Degenerative changes of the spine. Right shoulder arthroplasty. No acute osseous pathology. IMPRESSION: CHF slightly progressed since the prior radiograph. Pneumonia is not excluded. Electronically Signed   By: Vanetta Chou M.D.   On: 01/20/2024 09:57    ECHO as above.  08/09/2023: 25-30% EF, global hypokinesis, mod MR, mild-mod TR 02/13/2023: 60-65% EF, no RWMA, mild MR  TELEMETRY reviewed by me 01/23/2024: AV paced, rate 60s  EKG reviewed by me: sinus rhythm vs. Atrial paced with LBBB, rate 91 bpm  Data reviewed by me 01/23/2024: last 24h vitals tele labs imaging I/O hospitalist progress notes.  Principal Problem:   Acute  exacerbation of CHF (congestive heart failure) (HCC) Active Problems:   Diabetes mellitus without complication (HCC)   Chronic a-fib (HCC)   Essential hypertension   COPD (chronic obstructive pulmonary disease) (HCC)   Sick sinus syndrome (HCC)   Acute on chronic combined systolic and diastolic CHF (congestive heart failure) (HCC)   CKD (chronic kidney disease) stage 4, GFR 15-29 ml/min (HCC)   Malnutrition of moderate degree   Acute hypoxic respiratory failure (HCC)   Protein-calorie malnutrition, severe    ASSESSMENT AND PLAN:  Leslie Parker is a 89 y.o. female  with a past medical history of chronic HFrEF (EF 25-30% - 07/2023), paroxymal atrial fibrillation (on Eliquis , PO amio), SSS s/p [employer] dual chamber PPM (11/15/2022), hypertension,  type 2 diabetes mellitus, severe COPD stage 3 by GOLD classification, CKG stage 4 who presented to the ED on 01/20/2024 for worsening dyspnea and orthopnea. Of note patient is frequently hospitalized for acute on chronic heart failure,  most recently 11/2023. Cardiology was consulted for further evaluation for acute on chronic HFrEF with BNP > 4500.  # Acute on chronic HFrEF (25-30%) # Acute hypoxic respiratory failure  Patient with hx of multiple hospitalizations a/w with AoCHF, presents with worsening dyspnea and orthopnea. Patient with reduced EF of 25-30% as of 07/2023. This admission, CXR with pulmonary vascular congestion. BNP elevated at > 4500. Echo this admission with EF 25-30%, grade II diastolic dysfunction, mild MR, mild LVH. (Echo similar to prior). Improvement in SOB since admission s/p IV lasix .  -D/C IV lasix  40 mg today. Ordered PO torsemide  40 mg in the AM and 20 mg in PM. Closely monitor UOP and renal function.  (Patient states she takes PO torsemide  20 mg BID and doesn't miss doses.) -Continue home empagliflozin  10 mg daily.  -Continue home Coreg  6.25 mg BID. -Will hold off on resuming home Entresto  due to borderline BP. This  can be resumed at outpatient follow-up.  -Not eligible for MRA with eGFR < 30.  -Of note in outpatient HF cardiology notes, there has been confusion with heart failure medications, questions if patients takes medications as prescribed.   # Paroxymal atrial fibrillation # SSS s/p dual chamber PPM (10/2022) # Chronic LBBB Patient recent PPM device check on 12/24/23 with 69% RV pacing and 41% RA pacing. EKG in ED with LBBB with QRS duration of 199. Per tele patient in SR rate 60s.  -Monitor and replenish electrolytes for a goal K >4, Mag >2  -Continue PO Eliquis  2.5 mg daily for stroke risk reduction.  -Continue home PO Amio 200 mg daily.  -Coreg  as stated above. -Patient recently saw Dr. Tobie outpatient (10/29/23) that noted 40% RA and 70% RV pacing and states unsure if upgrading to CRT would be ideal from risk-benefit standpoint given advanced age and current quality of life.  # Hypertension Patient without chest pain. Most recent SBP 110s. Trops minimally elevated and flat 36 > 46. EKG in ED with sinus rhythm vs. Atrial paced with LBBB, rate 91 bpm.  -Continue home Imdur  30 mg daily.  -BP borderline, continuing holding home Entresto  as stated above.  -Minimally elevated and flat trops in setting of AoCHFrEF is most consistent with demand/supply mismatch and not ACS   Ok for discharge today from a cardiac perspective. Will arrange for follow up in clinic with Dr. Florencio and [facility], PA-C in 1-2 weeks.    This patient's plan of care was discussed and created with Dr. Wilburn and he is in agreement.  Signed: Dorene Comfort, PA-C  01/23/2024, 9:50 AM [facility]

## 2024-01-23 NOTE — Plan of Care (Signed)
 PMT shadowing.  Plans in place for patient to discharge home today.  PMT will sign off.  Please reconsult if needs arise

## 2024-01-23 NOTE — Discharge Summary (Signed)
 Physician Discharge Summary   Patient: Leslie Parker MRN: [id number] DOB: 06-Nov-1932  Admit date:     01/20/2024  Discharge date: 01/23/24  Discharge Physician: Cresencio Fairly   PCP: Sherial Bail, MD   Recommendations at discharge:    F/up with outpt providers as requested  Discharge Diagnoses: Principal Problem:   Acute exacerbation of CHF (congestive heart failure) (HCC) Active Problems:   Acute on chronic combined systolic and diastolic CHF (congestive heart failure) (HCC)   Acute hypoxic respiratory failure (HCC)   Chronic a-fib (HCC)   CKD (chronic kidney disease) stage 4, GFR 15-29 ml/min (HCC)   Essential hypertension   Sick sinus syndrome (HCC)   Diabetes mellitus without complication (HCC)   COPD (chronic obstructive pulmonary disease) (HCC)   Malnutrition of moderate degree   Protein-calorie malnutrition, severe  Hospital Course:  Leslie Parker is a 89 y.o. female with medical history significant for HFrEF, A-fib, CKD stage IV, COPD, diabetes, hypertension, bradycardia s/p pacemaker came to ED with complaint of shortness of breath.  Patient with multiple recent hospitalizations for similar reason. Patient also has orthopnea, EMS found her hypoxic in low 80s. No cough, fever or chest pain.  No lower extremity edema. Patient has 12 hospitalizations this year-palliative care was consulted but she declined hospice.  On presentation's vitals stable on 2 L of oxygen, labs with increasing creatinine to 2.24 with baseline seems to be around 1.9-2.0, hemoglobin of 10.8 which is around her baseline, BNP more than 4500 which she had it for the past many months, troponin 36, VBG with CO2 of 40 otherwise normal. Chest x-ray with cardiomegaly and vascular congestion.  Patient was given IV Lasix .  Patient with multiple hospitalizations and advanced heart failure.  Palliative care was again consulted.  7/1: Vitals stable on 2 L of oxygen, small improvement in renal function with  creatinine to 2.16-starting on regular diuresis. Palliative care and cardiology was consulted as patient continued to have significantly elevated BNP and advanced heart failure.  Unable to take care of herself and does not want to go to a facility.  Assessment and Plan: Acute on chronic combined systolic and diastolic CHF (congestive heart failure) (HCC) Patient with hx of multiple hospitalizations a/w with AoCHF, presents with worsening dyspnea and orthopnea. Patient with reduced EF of 25-30% as of 07/2023. This admission, CXR with pulmonary vascular congestion. BNP elevated at > 4500. Echo this admission with EF 25-30%, grade II diastolic dysfunction, mild MR, mild LVH. (Echo similar to prior). Improvement in SOB since admission s/p IV lasix .  -D/C IV lasix  40 mg today. Ordered PO torsemide  40 mg in the AM and 20 mg in PM.  -Continue home empagliflozin  10 mg daily.  -Continue home Coreg  6.25 mg BID. -Will hold off on resuming home Entresto  due to borderline BP. This can be resumed at outpatient follow-up.   Acute hypoxic respiratory failure (HCC) Patient was currently on 2 L of oxygen.  Likely due to CHF exacerbation.  No wheezing.  Chronic a-fib (HCC) Rate controlled. -Continue home amiodarone  and Eliquis   CKD (chronic kidney disease) stage 4, GFR 15-29 ml/min (HCC) Renal function currently stable and within baseline.  Essential hypertension Blood pressure within goal.  Sick sinus syndrome [facility]) S/p pacemaker in place  Diabetes mellitus without complication (HCC) COPD (chronic obstructive pulmonary disease) (HCC) Malnutrition of severe degree        Consultants: Cardio, Palliative Care Disposition: Home health Diet recommendation:  Discharge Diet Orders (From admission, onward)  Start     Ordered   01/23/24 0000  Diet - low sodium heart healthy        01/23/24 1048           Carb modified diet DISCHARGE MEDICATION: Allergies as of 01/23/2024       Reactions    Baclofen Other (See Comments)   Elevated Cr   Simvastatin Other (See Comments)   Pt denies Elevated LFTs with simva 40mg , stopped and resolved (see MD note 11/10/13)        Medication List     STOP taking these medications    ipratropium-albuterol  0.5-2.5 (3) MG/3ML Soln Commonly known as: DUONEB   lisinopril  5 MG tablet Commonly known as: ZESTRIL    oxyCODONE  5 MG immediate release tablet Commonly known as: Oxy IR/ROXICODONE        TAKE these medications    acetaminophen  500 MG tablet Commonly known as: TYLENOL  Take 2 tablets (1,000 mg total) by mouth 3 (three) times daily.   albuterol  108 (90 Base) MCG/ACT inhaler Commonly known as: VENTOLIN  HFA Inhale 2 puffs into the lungs every 6 (six) hours as needed.   alum & mag hydroxide-simeth 200-200-20 MG/5ML suspension Commonly known as: MAALOX/MYLANTA Take 30 mLs by mouth every 6 (six) hours as needed for indigestion or heartburn.   amiodarone  200 MG tablet Commonly known as: PACERONE  Take 1 tablet by mouth daily.   apixaban  2.5 MG Tabs tablet Commonly known as: ELIQUIS  Take 1 tablet (2.5 mg total) by mouth 2 (two) times daily.   ascorbic acid  250 MG tablet Commonly known as: VITAMIN C  Take 250 mg by mouth daily.   B-12 2500 MCG Tabs Take 2,500 mcg by mouth daily.   carvedilol  6.25 MG tablet Commonly known as: COREG  Take 1 tablet (6.25 mg total) by mouth 2 (two) times daily with a meal.   colestipol  1 g tablet Commonly known as: COLESTID  Take 2 g by mouth.  Take 2 g by mouth 2 (two) times daily   isosorbide  mononitrate 30 MG 24 hr tablet Commonly known as: IMDUR  Take 30 mg by mouth.  Take 30 mg by mouth at bedtime   Jardiance  10 MG Tabs tablet Generic drug: empagliflozin    losartan  25 MG tablet Commonly known as: Cozaar  Take 0.5 tablets (12.5 mg total) by mouth daily.   multivitamin with minerals Tabs tablet Take 1 tablet by mouth daily. One-A-Day Women's Vitamin   ondansetron  4 MG  tablet Commonly known as: ZOFRAN  Take 1 tablet (4 mg total) by mouth every 6 (six) hours as needed for nausea.   polyethylene glycol 17 g packet Commonly known as: MIRALAX  / GLYCOLAX  Take 17 g by mouth daily as needed.   senna-docusate 8.6-50 MG tablet Commonly known as: Senokot-S Take 2 tablets by mouth at bedtime as needed for mild constipation.   torsemide  20 MG tablet Commonly known as: DEMADEX  Take 2 tablets (40 mg total) by mouth in the morning AND 1 tablet (20 mg total) every evening. What changed: See the new instructions.   Trelegy Ellipta 100-62.5-25 MCG/ACT Aepb Generic drug: Fluticasone -Umeclidin-Vilant Inhale 1 puff into the lungs daily.   Vitamin D -1000 Max St 25 MCG (1000 UT) tablet Generic drug: Cholecalciferol  Take 1,000 Units by mouth daily.        Follow-up Information     Florencio Cara BIRCH, MD. Go on 01/29/2024.   Specialties: Cardiology, Internal Medicine Why: Hospital Follow up: July 9th @ 11 AM Contact information: [address] [place] [address] [phone]  Hudson, Caralyn, PA-C. Go in 1 week(s).   Specialty: Cardiology Contact information: [address] [place] [address] [phone]         Sherial Bail, MD. Schedule an appointment as soon as possible for a visit in 1 week(s).   Specialty: Internal Medicine Why: [facility] Discharge F/UP Contact information: [address] [place] [address] [phone]                Discharge Exam: Leslie Parker   01/20/24 9149 01/20/24 2253 01/21/24 0336  Weight: 68.9 kg 70.2 kg 70.2 kg   GEN: NAD, sitting up in the chair SKIN: Warm and dry EYES: No pallor or icterus ENT: MMM CV: RRR PULM: CTA b/l ABD: soft, ND, NT, +BS CNS: AAO x 3, non focal EXT: No edema or tenderness  Condition at discharge: fair  The results of significant diagnostics from this hospitalization (including imaging, microbiology, ancillary and  laboratory) are listed below for reference.   Imaging Studies: ECHOCARDIOGRAM COMPLETE Result Date: 01/21/2024    ECHOCARDIOGRAM REPORT   Patient Name:   Leslie Parker [place] Date of Exam: 01/21/2024 Medical Rec #:  [id number]     Height:       69.0 in Accession #:    [id number]    Weight:       154.8 lb Date of Birth:  1933/03/15      BSA:          1.853 m Patient Age:    89 years      BP:           130/64 mmHg Patient Gender: F             HR:           71 bpm. Exam Location:  [facility] Procedure: 2D Echo, Cardiac Doppler, Color Doppler and Strain Analysis (Both            Spectral and Color Flow Doppler were utilized during procedure). Indications:     Abnormal ECG R94.31  History:         Patient has prior history of Echocardiogram examinations, most                  recent 08/09/2023. CHF, Pacemaker; Risk Factors:Hypertension and                  Diabetes.  Sonographer:     Christopher Furnace Referring Phys:  [id number] SUMAYYA AMIN Diagnosing Phys: Leslie Parker  Sonographer Comments: Global longitudinal strain was attempted. IMPRESSIONS  1. Left ventricular ejection fraction, by estimation, is 25 to 30%. The left ventricle has severely decreased function. The left ventricle demonstrates regional wall motion abnormalities (see scoring diagram/findings for description). There is mild left  ventricular hypertrophy. Left ventricular diastolic parameters are consistent with Grade II diastolic dysfunction (pseudonormalization).  2. Right ventricular systolic function is normal. The right ventricular size is normal.  3. Left atrial size was mildly dilated.  4. Mild mitral valve regurgitation.  5. The aortic valve is tricuspid. Aortic valve regurgitation is not visualized. Mild aortic valve stenosis. FINDINGS  Left Ventricle: Left ventricular ejection fraction, by estimation, is 25 to 30%. The left ventricle has severely decreased function. The left ventricle demonstrates regional wall motion abnormalities. The left ventricular internal  cavity size was normal  in size. There is mild left ventricular hypertrophy. Left ventricular diastolic parameters are consistent with Grade II diastolic dysfunction (pseudonormalization).  LV Wall Scoring: The mid and distal anterior wall,  mid and distal lateral wall, mid and distal anterior septum, entire apex, mid and distal inferior wall, mid anterolateral segment, and mid inferoseptal segment are hypokinetic. The basal anteroseptal segment, basal inferolateral segment, basal anterolateral segment, basal anterior segment, basal inferior segment, and basal inferoseptal segment are normal. Right Ventricle: The right ventricular size is normal. No increase in right ventricular wall thickness. Right ventricular systolic function is normal. Left Atrium: Left atrial size was mildly dilated. Right Atrium: Right atrial size was normal in size. Pericardium: There is no evidence of pericardial effusion. Mitral Valve: There is moderate thickening of the mitral valve leaflet(s). Mild mitral annular calcification. Mild mitral valve regurgitation. MV peak gradient, 8.8 mmHg. The mean mitral valve gradient is 4.0 mmHg. Tricuspid Valve: The tricuspid valve is normal in structure. Tricuspid valve regurgitation is mild. Aortic Valve: The aortic valve is tricuspid. Aortic valve regurgitation is not visualized. Mild aortic stenosis is present. Aortic valve mean gradient measures 12.0 mmHg. Aortic valve peak gradient measures 21.7 mmHg. Aortic valve area, by VTI measures 1.27 cm. Pulmonic Valve: The pulmonic valve was not well visualized. Pulmonic valve regurgitation is not visualized. Aorta: The aortic root is normal in size and structure. IAS/Shunts: The atrial septum is grossly normal.  LEFT VENTRICLE PLAX 2D LVIDd:         4.30 cm      Diastology LVIDs:         3.80 cm      LV e' medial:    3.70 cm/s LV PW:         1.30 cm      LV E/e' medial:  23.2 LV IVS:        1.40 cm      LV e' lateral:   3.81 cm/s LVOT diam:     2.00 cm       LV E/e' lateral: 22.5 LV SV:         61 LV SV Index:   33 LVOT Area:     3.14 cm  LV Volumes (MOD) LV vol d, MOD A2C: 148.0 ml LV vol d, MOD A4C: 123.0 ml LV vol s, MOD A2C: 92.2 ml LV vol s, MOD A4C: 91.5 ml LV SV MOD A2C:     55.8 ml LV SV MOD A4C:     123.0 ml LV SV MOD BP:      46.2 ml RIGHT VENTRICLE RV Basal diam:  2.90 cm RV Mid diam:    2.30 cm RV S prime:     9.68 cm/s TAPSE (M-mode): 1.9 cm LEFT ATRIUM             Index        RIGHT ATRIUM           Index LA diam:        3.70 cm 2.00 cm/m   RA Area:     10.50 cm LA Vol (A2C):   53.7 ml 28.98 ml/m  RA Volume:   21.00 ml  11.33 ml/m LA Vol (A4C):   43.4 ml 23.43 ml/m LA Biplane Vol: 51.1 ml 27.58 ml/m  AORTIC VALVE AV Area (Vmax):    1.25 cm AV Area (Vmean):   1.26 cm AV Area (VTI):     1.27 cm AV Vmax:           232.67 cm/s AV Vmean:          159.667 cm/s AV VTI:            0.477 m  AV Peak Grad:      21.7 mmHg AV Mean Grad:      12.0 mmHg LVOT Vmax:         92.60 cm/s LVOT Vmean:        64.200 cm/s LVOT VTI:          0.193 m LVOT/AV VTI ratio: 0.40  AORTA Ao Root diam: 3.50 cm MITRAL VALVE                TRICUSPID VALVE MV Area (PHT): 2.88 cm     TR Peak grad:   28.7 mmHg MV Area VTI:   1.64 cm     TR Vmax:        268.00 cm/s MV Peak grad:  8.8 mmHg MV Mean grad:  4.0 mmHg     SHUNTS MV Vmax:       1.48 m/s     Systemic VTI:  0.19 m MV Vmean:      89.4 cm/s    Systemic Diam: 2.00 cm MV Decel Time: 263 msec MV E velocity: 85.90 cm/s MV A velocity: 130.00 cm/s MV E/A ratio:  0.66 Leslie Parker Electronically signed by Leslie Parker Signature Date/Time: 01/21/2024/12:48:16 PM    Final    DG Chest Portable 1 View Result Date: 01/20/2024 CLINICAL DATA:  Shortness of breath. EXAM: PORTABLE CHEST 1 VIEW COMPARISON:  Chest radiograph dated 12/14/2023. FINDINGS: Cardiomegaly with vascular congestion and edema. Pneumonia is not excluded. No large pleural effusion. No pneumothorax. Atherosclerotic calcification of the aorta left pectoral pacemaker  device. Degenerative changes of the spine. Right shoulder arthroplasty. No acute osseous pathology. IMPRESSION: CHF slightly progressed since the prior radiograph. Pneumonia is not excluded. Electronically Signed   By: Leslie Parker M.D.   On: 01/20/2024 09:57    Microbiology: Results for orders placed or performed during the hospital encounter of 12/14/23  Resp panel by RT-PCR (RSV, Flu A&B, Covid) Anterior Nasal Swab     Status: None   Collection Time: 12/14/23  8:27 AM   Specimen: Anterior Nasal Swab  Result Value Ref Range Status   SARS Coronavirus 2 by RT PCR NEGATIVE NEGATIVE Final    Comment: (NOTE) SARS-CoV-2 target nucleic acids are NOT DETECTED.  The SARS-CoV-2 RNA is generally detectable in upper respiratory specimens during the acute phase of infection. The lowest concentration of SARS-CoV-2 viral copies this assay can detect is 138 copies/mL. A negative result does not preclude SARS-Cov-2 infection and should not be used as the sole basis for treatment or other patient management decisions. A negative result may occur with  improper specimen collection/handling, submission of specimen other than nasopharyngeal swab, presence of viral mutation(s) within the areas targeted by this assay, and inadequate number of viral copies(<138 copies/mL). A negative result must be combined with clinical observations, patient history, and epidemiological information. The expected result is Negative.  Fact Sheet for Patients:  BloggerCourse.com  Fact Sheet for Healthcare Providers:  SeriousBroker.it  This test is no t yet approved or cleared by the [place]  FDA and  has been authorized for detection and/or diagnosis of SARS-CoV-2 by FDA under an Emergency Use Authorization (EUA). This EUA will remain  in effect (meaning this test can be used) for the duration of the COVID-19 declaration under Section 564(b)(1) of the Act,  21 U.S.C.section 360bbb-3(b)(1), unless the authorization is terminated  or revoked sooner.       Influenza A by PCR NEGATIVE NEGATIVE Final   Influenza B by PCR NEGATIVE NEGATIVE Final  Comment: (NOTE) The Xpert Xpress SARS-CoV-2/FLU/RSV plus assay is intended as an aid in the diagnosis of influenza from Nasopharyngeal swab specimens and should not be used as a sole basis for treatment. Nasal washings and aspirates are unacceptable for Xpert Xpress SARS-CoV-2/FLU/RSV testing.  Fact Sheet for Patients: BloggerCourse.com  Fact Sheet for Healthcare Providers: SeriousBroker.it  This test is not yet approved or cleared by the [place]  FDA and has been authorized for detection and/or diagnosis of SARS-CoV-2 by FDA under an Emergency Use Authorization (EUA). This EUA will remain in effect (meaning this test can be used) for the duration of the COVID-19 declaration under Section 564(b)(1) of the Act, 21 U.S.C. section 360bbb-3(b)(1), unless the authorization is terminated or revoked.     Resp Syncytial Virus by PCR NEGATIVE NEGATIVE Final    Comment: (NOTE) Fact Sheet for Patients: BloggerCourse.com  Fact Sheet for Healthcare Providers: SeriousBroker.it  This test is not yet approved or cleared by the [place]  FDA and has been authorized for detection and/or diagnosis of SARS-CoV-2 by FDA under an Emergency Use Authorization (EUA). This EUA will remain in effect (meaning this test can be used) for the duration of the COVID-19 declaration under Section 564(b)(1) of the Act, 21 U.S.C. section 360bbb-3(b)(1), unless the authorization is terminated or revoked.  Performed at [facility], [address] [place], [place] [address]     Labs: CBC: Recent Labs  Lab 01/20/24 [phone] 01/21/24 0250  WBC 5.7 6.3  NEUTROABS 3.8  --   HGB 10.8* 10.6*  HCT 32.8*  31.4*  MCV 96.5 93.7  PLT 261 254   Basic Metabolic Panel: Recent Labs  Lab 01/20/24 0917 01/21/24 0250 01/22/24 0439 01/23/24 0312  NA 137 140 138 135  K 3.9 3.6 4.4 4.4  CL 104 103 103 98  CO2 21* 25 27 26   GLUCOSE 171* 125* 117* 131*  BUN 59* 59* 62* 69*  CREATININE 2.24* 2.16* 2.21* 2.46*  CALCIUM 8.5* 8.5* 8.1* 8.2*  MG  --   --  1.7 2.0   Liver Function Tests: Recent Labs  Lab 01/20/24 0917  AST 34  ALT 27  ALKPHOS 99  BILITOT 1.0  PROT 6.0*  ALBUMIN  3.0*   CBG: Recent Labs  Lab 01/22/24 1113 01/22/24 1631 01/22/24 2041 01/23/24 0738 01/23/24 1128  GLUCAP 155* 118* 165* 139* 144*    Discharge time spent: greater than 30 minutes.  Signed: Cresencio Fairly, MD [facility] 01/23/2024

## 2024-01-23 NOTE — Progress Notes (Signed)
 Pt alert and oriented. VSS. Discharge instructions reviewed with pt. Pt verbalized understanding. Pt verbalized understanding of need to schedule/attend follow up appointments. Pt verbalized understanding of changes to medications. Prescriptions delivered to bedside. IV removed. IV site WNL. Printed discharge instructions given to pt. Pt left unit in wheelchair with tech. Pt left hospital with family.

## 2024-01-23 NOTE — Progress Notes (Signed)
   01/23/24 1045  Spiritual Encounters  Type of Visit Initial  Care provided to: Patient  Referral source Chaplain assessment  Reason for visit Routine spiritual support  OnCall Visit No  Spiritual Framework  Presenting Themes Meaning/purpose/sources of inspiration;Goals in life/care;Impactful experiences and emotions;Courage hope and growth;Other (comment) (Pt is so grateful to be discharged and sleep in her own bed!)  Interventions  Spiritual Care Interventions Made Established relationship of care and support;Compassionate presence;Reflective listening;Encouragement;Normalization of emotions  Intervention Outcomes  Outcomes Connection to spiritual care;Awareness around self/spiritual resourses;Awareness of health;Awareness of support

## 2024-01-23 NOTE — Progress Notes (Signed)
 Pt alert and oriented. Pt oxygen saturation 92% at rest on room air. Pt oxygen saturation 90% while ambulating on room air.

## 2024-01-23 NOTE — Plan of Care (Signed)
  Problem: Clinical Measurements: Goal: Ability to maintain clinical measurements within normal limits will improve Outcome: Progressing   Problem: Pain Managment: Goal: General experience of comfort will improve and/or be controlled Outcome: Progressing   Problem: Safety: Goal: Ability to remain free from injury will improve Outcome: Progressing   Problem: Elimination: Goal: Will not experience complications related to urinary retention Outcome: Progressing   Problem: Elimination: Goal: Will not experience complications related to bowel motility Outcome: Progressing

## 2024-01-28 DIAGNOSIS — N184 Chronic kidney disease, stage 4 (severe): Secondary | ICD-10-CM | POA: Diagnosis not present

## 2024-01-28 DIAGNOSIS — E785 Hyperlipidemia, unspecified: Secondary | ICD-10-CM | POA: Diagnosis not present

## 2024-01-28 DIAGNOSIS — E1122 Type 2 diabetes mellitus with diabetic chronic kidney disease: Secondary | ICD-10-CM | POA: Diagnosis not present

## 2024-01-28 DIAGNOSIS — M1990 Unspecified osteoarthritis, unspecified site: Secondary | ICD-10-CM | POA: Diagnosis not present

## 2024-01-28 DIAGNOSIS — J4489 Other specified chronic obstructive pulmonary disease: Secondary | ICD-10-CM | POA: Diagnosis not present

## 2024-01-28 DIAGNOSIS — I480 Paroxysmal atrial fibrillation: Secondary | ICD-10-CM | POA: Diagnosis not present

## 2024-01-28 DIAGNOSIS — S42101D Fracture of unspecified part of scapula, right shoulder, subsequent encounter for fracture with routine healing: Secondary | ICD-10-CM | POA: Diagnosis not present

## 2024-01-28 DIAGNOSIS — I130 Hypertensive heart and chronic kidney disease with heart failure and stage 1 through stage 4 chronic kidney disease, or unspecified chronic kidney disease: Secondary | ICD-10-CM | POA: Diagnosis not present

## 2024-01-28 DIAGNOSIS — I5042 Chronic combined systolic (congestive) and diastolic (congestive) heart failure: Secondary | ICD-10-CM | POA: Diagnosis not present

## 2024-01-29 DIAGNOSIS — R079 Chest pain, unspecified: Secondary | ICD-10-CM | POA: Diagnosis not present

## 2024-01-29 DIAGNOSIS — E1122 Type 2 diabetes mellitus with diabetic chronic kidney disease: Secondary | ICD-10-CM | POA: Diagnosis not present

## 2024-01-29 DIAGNOSIS — I10 Essential (primary) hypertension: Secondary | ICD-10-CM | POA: Diagnosis not present

## 2024-01-29 DIAGNOSIS — J449 Chronic obstructive pulmonary disease, unspecified: Secondary | ICD-10-CM | POA: Diagnosis not present

## 2024-01-29 DIAGNOSIS — I509 Heart failure, unspecified: Secondary | ICD-10-CM | POA: Diagnosis not present

## 2024-01-29 DIAGNOSIS — Z950 Presence of cardiac pacemaker: Secondary | ICD-10-CM | POA: Diagnosis not present

## 2024-01-29 DIAGNOSIS — I5033 Acute on chronic diastolic (congestive) heart failure: Secondary | ICD-10-CM | POA: Diagnosis not present

## 2024-01-29 DIAGNOSIS — R001 Bradycardia, unspecified: Secondary | ICD-10-CM | POA: Diagnosis not present

## 2024-01-29 DIAGNOSIS — I4891 Unspecified atrial fibrillation: Secondary | ICD-10-CM | POA: Diagnosis not present

## 2024-01-29 DIAGNOSIS — I495 Sick sinus syndrome: Secondary | ICD-10-CM | POA: Diagnosis not present

## 2024-01-29 DIAGNOSIS — J4530 Mild persistent asthma, uncomplicated: Secondary | ICD-10-CM | POA: Diagnosis not present

## 2024-01-29 DIAGNOSIS — N184 Chronic kidney disease, stage 4 (severe): Secondary | ICD-10-CM | POA: Diagnosis not present

## 2024-02-05 DIAGNOSIS — E1122 Type 2 diabetes mellitus with diabetic chronic kidney disease: Secondary | ICD-10-CM | POA: Diagnosis not present

## 2024-02-05 DIAGNOSIS — E785 Hyperlipidemia, unspecified: Secondary | ICD-10-CM | POA: Diagnosis not present

## 2024-02-05 DIAGNOSIS — I480 Paroxysmal atrial fibrillation: Secondary | ICD-10-CM | POA: Diagnosis not present

## 2024-02-05 DIAGNOSIS — I5042 Chronic combined systolic (congestive) and diastolic (congestive) heart failure: Secondary | ICD-10-CM | POA: Diagnosis not present

## 2024-02-05 DIAGNOSIS — M1990 Unspecified osteoarthritis, unspecified site: Secondary | ICD-10-CM | POA: Diagnosis not present

## 2024-02-05 DIAGNOSIS — I130 Hypertensive heart and chronic kidney disease with heart failure and stage 1 through stage 4 chronic kidney disease, or unspecified chronic kidney disease: Secondary | ICD-10-CM | POA: Diagnosis not present

## 2024-02-05 DIAGNOSIS — J4489 Other specified chronic obstructive pulmonary disease: Secondary | ICD-10-CM | POA: Diagnosis not present

## 2024-02-05 DIAGNOSIS — N184 Chronic kidney disease, stage 4 (severe): Secondary | ICD-10-CM | POA: Diagnosis not present

## 2024-02-05 DIAGNOSIS — S42101D Fracture of unspecified part of scapula, right shoulder, subsequent encounter for fracture with routine healing: Secondary | ICD-10-CM | POA: Diagnosis not present

## 2024-02-06 DIAGNOSIS — E1122 Type 2 diabetes mellitus with diabetic chronic kidney disease: Secondary | ICD-10-CM | POA: Diagnosis not present

## 2024-02-06 DIAGNOSIS — M1990 Unspecified osteoarthritis, unspecified site: Secondary | ICD-10-CM | POA: Diagnosis not present

## 2024-02-06 DIAGNOSIS — E785 Hyperlipidemia, unspecified: Secondary | ICD-10-CM | POA: Diagnosis not present

## 2024-02-06 DIAGNOSIS — I130 Hypertensive heart and chronic kidney disease with heart failure and stage 1 through stage 4 chronic kidney disease, or unspecified chronic kidney disease: Secondary | ICD-10-CM | POA: Diagnosis not present

## 2024-02-06 DIAGNOSIS — S42101D Fracture of unspecified part of scapula, right shoulder, subsequent encounter for fracture with routine healing: Secondary | ICD-10-CM | POA: Diagnosis not present

## 2024-02-06 DIAGNOSIS — I5042 Chronic combined systolic (congestive) and diastolic (congestive) heart failure: Secondary | ICD-10-CM | POA: Diagnosis not present

## 2024-02-06 DIAGNOSIS — I480 Paroxysmal atrial fibrillation: Secondary | ICD-10-CM | POA: Diagnosis not present

## 2024-02-06 DIAGNOSIS — J4489 Other specified chronic obstructive pulmonary disease: Secondary | ICD-10-CM | POA: Diagnosis not present

## 2024-02-06 DIAGNOSIS — N184 Chronic kidney disease, stage 4 (severe): Secondary | ICD-10-CM | POA: Diagnosis not present

## 2024-02-07 DIAGNOSIS — N184 Chronic kidney disease, stage 4 (severe): Secondary | ICD-10-CM | POA: Diagnosis not present

## 2024-02-07 DIAGNOSIS — I130 Hypertensive heart and chronic kidney disease with heart failure and stage 1 through stage 4 chronic kidney disease, or unspecified chronic kidney disease: Secondary | ICD-10-CM | POA: Diagnosis not present

## 2024-02-07 DIAGNOSIS — J4489 Other specified chronic obstructive pulmonary disease: Secondary | ICD-10-CM | POA: Diagnosis not present

## 2024-02-07 DIAGNOSIS — I5042 Chronic combined systolic (congestive) and diastolic (congestive) heart failure: Secondary | ICD-10-CM | POA: Diagnosis not present

## 2024-02-07 DIAGNOSIS — S42101D Fracture of unspecified part of scapula, right shoulder, subsequent encounter for fracture with routine healing: Secondary | ICD-10-CM | POA: Diagnosis not present

## 2024-02-07 DIAGNOSIS — E1122 Type 2 diabetes mellitus with diabetic chronic kidney disease: Secondary | ICD-10-CM | POA: Diagnosis not present

## 2024-02-07 DIAGNOSIS — I480 Paroxysmal atrial fibrillation: Secondary | ICD-10-CM | POA: Diagnosis not present

## 2024-02-07 DIAGNOSIS — E785 Hyperlipidemia, unspecified: Secondary | ICD-10-CM | POA: Diagnosis not present

## 2024-02-07 DIAGNOSIS — M1990 Unspecified osteoarthritis, unspecified site: Secondary | ICD-10-CM | POA: Diagnosis not present

## 2024-02-12 ENCOUNTER — Telehealth: Payer: Self-pay | Admitting: Family

## 2024-02-12 NOTE — Telephone Encounter (Signed)
 Called to confirm/remind patient of their appointment [facility] on [facility].   Appointment:   [x] Confirmed  [] Left mess   [] No answer/No voice mail  [] VM Full/unable to leave message  [] Phone not in service  Patient reminded to bring all medications and/or complete list.  Confirmed patient has transportation. Gave directions, instructed to utilize valet parking.

## 2024-02-12 NOTE — Progress Notes (Unsigned)
 Advanced [facility] Clinic Note   PCP: Sherial Bail, MD (last seen 11/24) Cardiologist: Florencio Kava, MD (last seen 10/24)  HPI:  [name] is a 89 y/o female with a history of COPD stage IV, PAF, HTN, DM, CKD, hypomagnesemia/ hypokalemia, hyponatremia, pacemaker due to SSS (04/24), asthma & chronic heart failure.   Admitted 03/20/23 due to shortness of breath, weakness and pedal edema. Chest x-ray done today shows retrocardiac strandy opacities like atelectasis. IV lasix  given. Completed course of antibiotics for pneumonia. CXR showed multifocal nodular opacity in the left lower lobe, possibly endobronchial. The largest nodule measures 11 mm. 107-month follow-up recommended to ensure resolution.   Admitted 04/30/23 due to shortness of breath and found to by hypoxic. Started on IV solumedrol and given IV magnesium  for Mg of 1.2. Lasix  had to be held due to rising creatinine. IVF given for continuing rise in creatinine. Renal ultrasound showing multiple bilateral renal cysts. Every other day furosemide  resumed.   Admitted 06/04/23 due to increased work of breathing over the past 2 to 3 days. Positive orthopnea and PND. Placed on bipap. BNP 1530. Cardiology consulted. IV lasix  given. Weaned off bipap and on to 3L oxygen & then weaned down to 1L  Admitted 06/16/23 due to worsening shortness of breath. Found to have elevated BNP. IV diuresed. Did not meet criteria for home oxygen after walk test completed. Metoprolol  stopped & carvedilol  started. Elevated troponin thought to be due to demand ischemia.   Echo 02/18/23: EF >55% with mild LVH, Grade I DD, mild LAE Echo 06/06/22: EF 60-65% with mild LVH, mild LAE, mild MR, mild/ moderate TR, mild MS Echo 02/13/23: EF 60-65% with moderate LVH, Grade I DD, mild MR  She presents today for a HF follow-up visit with a chief complaint of moderate fatigue with minimal exertion. Describes this as chronic in nature. Has associated minimal shortness of  breath with moderate exertion, cough, left hip pain and pedal edema along with this. Denies chest pain, palpitations, abdominal distention, dizziness, weight gain or difficulty sleeping. Was unable to use [facility] home sleep study because she doesn't have internet. Denies snoring and says that she no longer has any apneic episodes & doesn't wake herself up from sleep gasping.   Getting PT at home. Weighing daily at home and has been 177 since she was discharged from the hospital.    Per recent discharge summary, she was supposed to stop taking metoprolol  and begin carvedilol . She says she was going by her old med list and has continued taking metoprolol . She says that her pharmacy said there was something new ready for her to pick up.   ROS: All systems negative except as listed in HPI, PMH and Problem List.  SH:  Social History   Socioeconomic History   Marital status: Divorced    Spouse name: Not on file   Number of children: 4   Years of education: Not on file   Highest education level: 12th grade  Occupational History   Occupation: Retitred/Disability  Tobacco Use   Smoking status: Never   Smokeless tobacco: Never  Vaping Use   Vaping status: Never Used  Substance and Sexual Activity   Alcohol use: No    Alcohol/week: 0.0 standard drinks of alcohol   Drug use: No   Sexual activity: Not Currently  Other Topics Concern   Not on file  Social History Narrative   Not on file   Social Drivers of Health   Financial Resource Strain: Low Risk  (  01/13/2024)   Received from [facility]   Overall Financial Resource Strain (CARDIA)    Difficulty of Paying Living Expenses: Not hard at all  Food Insecurity: No Food Insecurity (01/20/2024)   Hunger Vital Sign    Worried About Running Out of Food in the Last Year: Never true    Ran Out of Food in the Last Year: Never true  Transportation Needs: No Transportation Needs (01/20/2024)   PRAPARE - Scientist, research (physical sciences) (Medical): No    Lack of Transportation (Non-Medical): No  Physical Activity: Not on file  Stress: Not on file  Social Connections: Moderately Isolated (01/20/2024)   Social Connection and Isolation Panel    Frequency of Communication with Friends and Family: More than three times a week    Frequency of Social Gatherings with Friends and Family: Three times a week    Attends Religious Services: 1 to 4 times per year    Active Member of Clubs or Organizations: No    Attends Banker Meetings: Never    Marital Status: Divorced  Catering manager Violence: Not At Risk (01/20/2024)   Humiliation, Afraid, Rape, and Kick questionnaire    Fear of Current or Ex-Partner: No    Emotionally Abused: No    Physically Abused: No    Sexually Abused: No    FH:  Family History  Problem Relation Age of Onset   Heart failure Mother    Hypertension Mother    Emphysema Father     Past Medical History:  Diagnosis Date   Acute kidney injury superimposed on CKD (HCC) 11/24/2022   Asthma    Atrial fibrillation with RVR (HCC)    Cancer (HCC)    Basal Cell   CHF (congestive heart failure) (HCC)    Closed left hip fracture (HCC) 01/25/2017   Closed right hip fracture (HCC) 02/13/2023   Diabetes mellitus without complication (HCC)    Femur fracture, left (HCC) 02/03/2015   Heart murmur    Hypertension    Impetigo    Osteoarthritis    Osteopenia    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   Rapid atrial fibrillation, new onset(HCC) 06/05/2022   Vomiting    can not due to surgery   Wears dentures    full upper and lower    Current Outpatient Medications  Medication Sig Dispense Refill   acetaminophen  (TYLENOL ) 500 MG tablet Take 2 tablets (1,000 mg total) by mouth 3 (three) times daily.     albuterol  (VENTOLIN  HFA) 108 (90 Base) MCG/ACT inhaler Inhale 2 puffs into the lungs every 6 (six) hours as needed.     alum & mag hydroxide-simeth (MAALOX/MYLANTA) 200-200-20 MG/5ML  suspension Take 30 mLs by mouth every 6 (six) hours as needed for indigestion or heartburn.     amiodarone  (PACERONE ) 200 MG tablet Take 1 tablet by mouth daily.     apixaban  (ELIQUIS ) 2.5 MG TABS tablet Take 1 tablet (2.5 mg total) by mouth 2 (two) times daily. 60 tablet 1   ascorbic acid  (VITAMIN C ) 250 MG tablet Take 250 mg by mouth daily.     carvedilol  (COREG ) 6.25 MG tablet Take 1 tablet (6.25 mg total) by mouth 2 (two) times daily with a meal. 60 tablet 0   Cholecalciferol  (VITAMIN D -1000 MAX ST) 25 MCG (1000 UT) tablet Take 1,000 Units by mouth daily.     colestipol  (COLESTID ) 1 g tablet Take 2 g by mouth.  Take 2 g by mouth 2 (  two) times daily     Cyanocobalamin  (B-12) 2500 MCG TABS Take 2,500 mcg by mouth daily.     isosorbide  mononitrate (IMDUR ) 30 MG 24 hr tablet Take 30 mg by mouth.  Take 30 mg by mouth at bedtime     JARDIANCE  10 MG TABS tablet      losartan  (COZAAR ) 25 MG tablet Take 0.5 tablets (12.5 mg total) by mouth daily. 30 tablet 0   Multiple Vitamin (MULTIVITAMIN WITH MINERALS) TABS tablet Take 1 tablet by mouth daily. One-A-Day Women's Vitamin     ondansetron  (ZOFRAN ) 4 MG tablet Take 1 tablet (4 mg total) by mouth every 6 (six) hours as needed for nausea. 20 tablet 0   polyethylene glycol (MIRALAX  / GLYCOLAX ) 17 g packet Take 17 g by mouth daily as needed.     senna-docusate (SENOKOT-S) 8.6-50 MG tablet Take 2 tablets by mouth at bedtime as needed for mild constipation.     torsemide  (DEMADEX ) 20 MG tablet Take 2 tablets (40 mg total) by mouth in the morning AND 1 tablet (20 mg total) every evening. 60 tablet 0   TRELEGY ELLIPTA 100-62.5-25 MCG/ACT AEPB Inhale 1 puff into the lungs daily.     No current facility-administered medications for this visit.   There were no vitals filed for this visit.  Wt Readings from Last 3 Encounters:  01/21/24 154 lb 12.2 oz (70.2 kg)  12/16/23 140 lb 8 oz (63.7 kg)  11/26/23 156 lb 4.9 oz (70.9 kg)   Lab Results  Component Value  Date   CREATININE 2.46 (H) 01/23/2024   CREATININE 2.21 (H) 01/22/2024   CREATININE 2.16 (H) 01/21/2024   PHYSICAL EXAM:  General:  Well appearing. No resp difficulty HEENT: normal Neck: supple. JVP flat. No lymphadenopathy or thryomegaly appreciated. Cor: PMI normal. Regular rate & irregular rhythm. No rubs, gallops or murmurs. Lungs: clear Abdomen: soft, nontender, nondistended. No hepatosplenomegaly. No bruits or masses.  Extremities: no cyanosis, clubbing, rash, 2+ pitting edema bilateral lower legs Neuro: alert & orientedx3, cranial nerves grossly intact. Moves all 4 extremities w/o difficulty. Affect pleasant.   ECG: not done   ASSESSMENT & PLAN:  1: NICM with preserved ejection fraction- - suspect due to AF/ COPD/ ? OSA - NYHA class III - euvolemic - weighing daily; reminded to call for overnight weight gain of > 2 pounds or a weekly weight gain of > 5 pounds - weight down 3 pounds from last visit here 6 weeks ago - Echo 02/18/23: EF >55% with mild LVH, Grade I DD, mild LAE - Echo 06/06/22: EF 60-65% with mild LVH, mild LAE, mild MR, mild/ moderate TR, mild MS - Echo 02/13/23: EF 60-65% with moderate LVH, Grade I DD, mild MR - stop metoprolol  and begin carvedilol  3.125mg  BID - continue furosemide  20mg  daily - she has ordered compression socks and reviewed to put them on every morning with removal at bedtime - BMET today as she may need potassium supplement - BNP 06/16/23 was 1602.3  2: HTN- - BP 146/72 - saw PCP Deretha) 11/24 - continue carvedilol  3.125mg  BID - continue hydralazone 25mg  TID PRN - continue isosorbide  MN 30mg  daily - BMP 06/19/23 showed sodium 138, potassium 3.4, creatinine 2.26 & GFR 20 - BMET today  3: Atrial fibrillation- - saw cardiology Philippe) 10/24 - continue amiodarone  200mg  daily - continue apixaban  2.5mg  BID - continue carvedilol  3.125mg  BID - continue dilitazem 360mg  daily - pacemaker itself does move and she can make one side  of it  stick up (6 months post implantation); she is going to discuss w/ cardiology  4: DM- - A1c 03/21/23 was 5.8% - continue glucotrol  XL 2.5mg  daily  5: COPD- - saw pulmonology Alica) 07/24 - does not wear oxygen  6: Snoring- - resolved - D. Wood said for patient to throw [facility] home sleep study box away since she had opened it - not interested in sleep lab referral  Return in 1 month, sooner if needed.

## 2024-02-13 ENCOUNTER — Ambulatory Visit (HOSPITAL_BASED_OUTPATIENT_CLINIC_OR_DEPARTMENT_OTHER): Admitting: Family

## 2024-02-13 ENCOUNTER — Other Ambulatory Visit
Admission: RE | Admit: 2024-02-13 | Discharge: 2024-02-13 | Disposition: A | Discharge status: Home / Self Care | Source: Ambulatory Visit | Attending: Family | Admitting: Family

## 2024-02-13 ENCOUNTER — Ambulatory Visit: Payer: Self-pay | Admitting: Family

## 2024-02-13 VITALS — BP 159/66 | HR 61 | Wt 158.0 lb

## 2024-02-13 DIAGNOSIS — I10 Essential (primary) hypertension: Secondary | ICD-10-CM | POA: Diagnosis not present

## 2024-02-13 DIAGNOSIS — I482 Chronic atrial fibrillation, unspecified: Secondary | ICD-10-CM

## 2024-02-13 DIAGNOSIS — E1122 Type 2 diabetes mellitus with diabetic chronic kidney disease: Secondary | ICD-10-CM

## 2024-02-13 DIAGNOSIS — I5032 Chronic diastolic (congestive) heart failure: Secondary | ICD-10-CM

## 2024-02-13 DIAGNOSIS — N184 Chronic kidney disease, stage 4 (severe): Secondary | ICD-10-CM | POA: Diagnosis not present

## 2024-02-13 DIAGNOSIS — J449 Chronic obstructive pulmonary disease, unspecified: Secondary | ICD-10-CM | POA: Diagnosis not present

## 2024-02-13 LAB — BASIC METABOLIC PANEL WITH GFR
Anion gap: 11 (ref 5–15)
BUN: 80 mg/dL — ABNORMAL HIGH (ref 8–23)
CO2: 24 mmol/L (ref 22–32)
Calcium: 8.2 mg/dL — ABNORMAL LOW (ref 8.9–10.3)
Chloride: 104 mmol/L (ref 98–111)
Creatinine, Ser: 2.85 mg/dL — ABNORMAL HIGH (ref 0.44–1.00)
GFR, Estimated: 15 mL/min — ABNORMAL LOW (ref >60)
Glucose, Bld: 118 mg/dL — ABNORMAL HIGH (ref 70–99)
Potassium: 3 mmol/L — ABNORMAL LOW (ref 3.5–5.1)
Sodium: 139 mmol/L (ref 135–145)

## 2024-02-13 LAB — BRAIN NATRIURETIC PEPTIDE: B Natriuretic Peptide: >4500 pg/mL — ABNORMAL HIGH (ref 0.0–100.0)

## 2024-02-13 MED ORDER — TORSEMIDE 20 MG PO TABS: ORAL_TABLET | ORAL | 11 refills | Status: DC

## 2024-02-13 MED ORDER — POTASSIUM CHLORIDE CRYS ER 20 MEQ PO TBCR: 40.0000 meq | EXTENDED_RELEASE_TABLET | Freq: Every day | ORAL | 5 refills | Status: DC

## 2024-02-13 NOTE — Patient Instructions (Signed)
 Medication Changes:  INCREASE TORSEMIDE  TO 40 MG IN THE AM AND 20 MG IN THE AFTERNOON.   Lab Work:  Go over to the [facility]. Go pass the gift shop and have your blood work completed.  We will only call you if the results are abnormal or if the provider would like to make medication changes.  No news is good news.   Special Instructions // Education:  WEAR COMPRESSION SOCKS DAILY AND REMOVE THEM AT BEDTIME.   Follow-Up in: 2 WEEKS WITH ELLOUISE CLASS, FNP.   Thank you for choosing [facility] [facility].    At the [facility] Clinic, you and your health needs are our priority. We have a designated team specialized in the treatment of Heart Failure. This Care Team includes your primary Heart Failure Specialized Cardiologist (physician), Advanced Practice Providers (APPs- Physician Assistants and Nurse Practitioners), and Pharmacist who all work together to provide you with the care you need, when you need it.   You may see any of the following providers on your designated Care Team at your next follow up:  Dr. Toribio Fuel Dr. Ezra Shuck Dr. Ria Commander Dr. Morene Brownie ELLOUISE CLASS, FNP Jaun Bash, RPH-CPP  Please be sure to bring in all your medications bottles to every appointment.   Need to Contact Us :  If you have any questions or concerns before your next appointment please send us  a message through [place] or call our office at [phone].    TO LEAVE A MESSAGE FOR THE NURSE SELECT OPTION 2, PLEASE LEAVE A MESSAGE INCLUDING: YOUR NAME DATE OF BIRTH CALL BACK NUMBER REASON FOR CALL**this is important as we prioritize the call backs  YOU WILL RECEIVE A CALL BACK THE SAME DAY AS LONG AS YOU CALL BEFORE 4:00 PM

## 2024-02-14 ENCOUNTER — Encounter: Payer: Self-pay | Admitting: Family

## 2024-02-14 DIAGNOSIS — M48062 Spinal stenosis, lumbar region with neurogenic claudication: Secondary | ICD-10-CM | POA: Diagnosis not present

## 2024-02-14 DIAGNOSIS — M6281 Muscle weakness (generalized): Secondary | ICD-10-CM | POA: Diagnosis not present

## 2024-02-20 ENCOUNTER — Other Ambulatory Visit: Payer: Self-pay | Admitting: Orthopedic Surgery

## 2024-02-20 DIAGNOSIS — M48062 Spinal stenosis, lumbar region with neurogenic claudication: Secondary | ICD-10-CM

## 2024-02-25 ENCOUNTER — Observation Stay
Admission: EM | Admit: 2024-02-25 | Discharge: 2024-03-02 | Disposition: A | Discharge status: Home / Self Care | Attending: Family Medicine | Admitting: Family Medicine

## 2024-02-25 ENCOUNTER — Encounter: Payer: Self-pay | Admitting: Emergency Medicine

## 2024-02-25 ENCOUNTER — Emergency Department

## 2024-02-25 ENCOUNTER — Other Ambulatory Visit: Payer: Self-pay

## 2024-02-25 DIAGNOSIS — Z7901 Long term (current) use of anticoagulants: Secondary | ICD-10-CM | POA: Insufficient documentation

## 2024-02-25 DIAGNOSIS — I4891 Unspecified atrial fibrillation: Secondary | ICD-10-CM | POA: Diagnosis not present

## 2024-02-25 DIAGNOSIS — I130 Hypertensive heart and chronic kidney disease with heart failure and stage 1 through stage 4 chronic kidney disease, or unspecified chronic kidney disease: Secondary | ICD-10-CM | POA: Insufficient documentation

## 2024-02-25 DIAGNOSIS — J9601 Acute respiratory failure with hypoxia: Secondary | ICD-10-CM | POA: Diagnosis not present

## 2024-02-25 DIAGNOSIS — I5023 Acute on chronic systolic (congestive) heart failure: Secondary | ICD-10-CM | POA: Diagnosis present

## 2024-02-25 DIAGNOSIS — R195 Other fecal abnormalities: Secondary | ICD-10-CM

## 2024-02-25 DIAGNOSIS — R109 Unspecified abdominal pain: Secondary | ICD-10-CM | POA: Diagnosis not present

## 2024-02-25 DIAGNOSIS — N184 Chronic kidney disease, stage 4 (severe): Secondary | ICD-10-CM | POA: Insufficient documentation

## 2024-02-25 DIAGNOSIS — I5043 Acute on chronic combined systolic (congestive) and diastolic (congestive) heart failure: Secondary | ICD-10-CM | POA: Insufficient documentation

## 2024-02-25 DIAGNOSIS — Z9189 Other specified personal risk factors, not elsewhere classified: Secondary | ICD-10-CM | POA: Insufficient documentation

## 2024-02-25 DIAGNOSIS — N179 Acute kidney failure, unspecified: Secondary | ICD-10-CM | POA: Diagnosis not present

## 2024-02-25 DIAGNOSIS — J9811 Atelectasis: Secondary | ICD-10-CM | POA: Diagnosis not present

## 2024-02-25 DIAGNOSIS — N189 Chronic kidney disease, unspecified: Secondary | ICD-10-CM | POA: Diagnosis not present

## 2024-02-25 DIAGNOSIS — E1122 Type 2 diabetes mellitus with diabetic chronic kidney disease: Secondary | ICD-10-CM | POA: Insufficient documentation

## 2024-02-25 DIAGNOSIS — I509 Heart failure, unspecified: Secondary | ICD-10-CM

## 2024-02-25 DIAGNOSIS — I495 Sick sinus syndrome: Secondary | ICD-10-CM | POA: Insufficient documentation

## 2024-02-25 DIAGNOSIS — I5033 Acute on chronic diastolic (congestive) heart failure: Secondary | ICD-10-CM

## 2024-02-25 DIAGNOSIS — J449 Chronic obstructive pulmonary disease, unspecified: Secondary | ICD-10-CM | POA: Insufficient documentation

## 2024-02-25 DIAGNOSIS — I110 Hypertensive heart disease with heart failure: Secondary | ICD-10-CM | POA: Diagnosis not present

## 2024-02-25 DIAGNOSIS — J90 Pleural effusion, not elsewhere classified: Secondary | ICD-10-CM | POA: Diagnosis not present

## 2024-02-25 DIAGNOSIS — I482 Chronic atrial fibrillation, unspecified: Secondary | ICD-10-CM | POA: Diagnosis present

## 2024-02-25 DIAGNOSIS — R0789 Other chest pain: Principal | ICD-10-CM | POA: Insufficient documentation

## 2024-02-25 DIAGNOSIS — R0989 Other specified symptoms and signs involving the circulatory and respiratory systems: Secondary | ICD-10-CM | POA: Diagnosis not present

## 2024-02-25 DIAGNOSIS — R079 Chest pain, unspecified: Secondary | ICD-10-CM | POA: Diagnosis not present

## 2024-02-25 LAB — COMPREHENSIVE METABOLIC PANEL WITH GFR
ALT: 26 U/L (ref 0–44)
AST: 33 U/L (ref 15–41)
Albumin: 3.4 g/dL — ABNORMAL LOW (ref 3.5–5.0)
Alkaline Phosphatase: 76 U/L (ref 38–126)
Anion gap: 11 (ref 5–15)
BUN: 87 mg/dL — ABNORMAL HIGH (ref 8–23)
CO2: 24 mmol/L (ref 22–32)
Calcium: 8.6 mg/dL — ABNORMAL LOW (ref 8.9–10.3)
Chloride: 104 mmol/L (ref 98–111)
Creatinine, Ser: 3.22 mg/dL — ABNORMAL HIGH (ref 0.44–1.00)
GFR, Estimated: 13 mL/min — ABNORMAL LOW (ref >60)
Glucose, Bld: 120 mg/dL — ABNORMAL HIGH (ref 70–99)
Potassium: 4.4 mmol/L (ref 3.5–5.1)
Sodium: 139 mmol/L (ref 135–145)
Total Bilirubin: 0.7 mg/dL (ref 0.0–1.2)
Total Protein: 6.9 g/dL (ref 6.5–8.1)

## 2024-02-25 LAB — CBC WITH DIFFERENTIAL/PLATELET
Abs Immature Granulocytes: 0.01 K/uL (ref 0.00–0.07)
Basophils Absolute: 0 K/uL (ref 0.0–0.1)
Basophils Relative: 1 %
Eosinophils Absolute: 0.1 K/uL (ref 0.0–0.5)
Eosinophils Relative: 2 %
HCT: 36.2 % (ref 36.0–46.0)
Hemoglobin: 11.4 g/dL — ABNORMAL LOW (ref 12.0–15.0)
Immature Granulocytes: 0 %
Lymphocytes Relative: 36 %
Lymphs Abs: 1.5 K/uL (ref 0.7–4.0)
MCH: 30.8 pg (ref 26.0–34.0)
MCHC: 31.5 g/dL (ref 30.0–36.0)
MCV: 97.8 fL (ref 80.0–100.0)
Monocytes Absolute: 0.3 K/uL (ref 0.1–1.0)
Monocytes Relative: 7 %
Neutro Abs: 2.2 K/uL (ref 1.7–7.7)
Neutrophils Relative %: 54 %
Platelets: 184 K/uL (ref 150–400)
RBC: 3.7 MIL/uL — ABNORMAL LOW (ref 3.87–5.11)
RDW: 16 % — ABNORMAL HIGH (ref 11.5–15.5)
WBC: 4 K/uL (ref 4.0–10.5)
nRBC: 0 % (ref 0.0–0.2)

## 2024-02-25 LAB — TROPONIN I (HIGH SENSITIVITY)
Troponin I (High Sensitivity): 38 ng/L — ABNORMAL HIGH (ref <18)
Troponin I (High Sensitivity): 33 ng/L — ABNORMAL HIGH (ref <18)

## 2024-02-25 LAB — BRAIN NATRIURETIC PEPTIDE: B Natriuretic Peptide: >4500 pg/mL — ABNORMAL HIGH (ref 0.0–100.0)

## 2024-02-25 MED ORDER — ONDANSETRON HCL 4 MG PO TABS
4.0000 mg | ORAL_TABLET | Freq: Four times a day (QID) | ORAL | Status: DC | PRN
  Administered 2024-02-28: 4 mg via ORAL
  Filled 2024-02-25: qty 1

## 2024-02-25 MED ORDER — MORPHINE SULFATE (PF) 2 MG/ML IV SOLN
2.0000 mg | Freq: Once | INTRAVENOUS | Status: AC
  Administered 2024-02-25: 2 mg via INTRAVENOUS
  Filled 2024-02-25: qty 1

## 2024-02-25 MED ORDER — CARVEDILOL 6.25 MG PO TABS
6.2500 mg | ORAL_TABLET | Freq: Two times a day (BID) | ORAL | Status: DC
  Administered 2024-02-25 – 2024-03-02 (×13): 6.25 mg via ORAL
  Filled 2024-02-25 (×13): qty 1

## 2024-02-25 MED ORDER — POTASSIUM CHLORIDE CRYS ER 20 MEQ PO TBCR
40.0000 meq | EXTENDED_RELEASE_TABLET | Freq: Every day | ORAL | Status: DC
  Administered 2024-02-26 – 2024-02-28 (×2): 40 meq via ORAL
  Filled 2024-02-25 (×2): qty 2

## 2024-02-25 MED ORDER — BUDESON-GLYCOPYRROL-FORMOTEROL 160-9-4.8 MCG/ACT IN AERO
2.0000 | INHALATION_SPRAY | Freq: Two times a day (BID) | RESPIRATORY_TRACT | Status: DC
  Administered 2024-02-25 – 2024-03-01 (×10): 2 via RESPIRATORY_TRACT
  Filled 2024-02-25 (×3): qty 5.9

## 2024-02-25 MED ORDER — ACETAMINOPHEN 500 MG PO TABS
1000.0000 mg | ORAL_TABLET | Freq: Three times a day (TID) | ORAL | Status: DC
  Administered 2024-02-25 – 2024-03-02 (×20): 1000 mg via ORAL
  Filled 2024-02-25 (×19): qty 2

## 2024-02-25 MED ORDER — POLYETHYLENE GLYCOL 3350 17 G PO PACK: 17.0000 g | PACK | Freq: Every day | ORAL | Status: DC | PRN

## 2024-02-25 MED ORDER — ISOSORBIDE MONONITRATE ER 30 MG PO TB24
30.0000 mg | ORAL_TABLET | Freq: Every day | ORAL | Status: DC
Start: 2024-02-25 — End: 2024-03-02
  Administered 2024-02-25 – 2024-03-02 (×8): 30 mg via ORAL
  Filled 2024-02-25 (×7): qty 1

## 2024-02-25 MED ORDER — AMIODARONE HCL 200 MG PO TABS
200.0000 mg | ORAL_TABLET | Freq: Every day | ORAL | Status: DC
  Administered 2024-02-25 – 2024-03-02 (×8): 200 mg via ORAL
  Filled 2024-02-25 (×7): qty 1

## 2024-02-25 MED ORDER — ALUM & MAG HYDROXIDE-SIMETH 200-200-20 MG/5ML PO SUSP
30.0000 mL | Freq: Four times a day (QID) | ORAL | Status: DC | PRN
  Administered 2024-02-28 – 2024-02-29 (×2): 30 mL via ORAL
  Filled 2024-02-25 (×2): qty 30

## 2024-02-25 MED ORDER — FUROSEMIDE 10 MG/ML IJ SOLN
40.0000 mg | Freq: Once | INTRAMUSCULAR | Status: AC
  Administered 2024-02-25: 40 mg via INTRAVENOUS
  Filled 2024-02-25: qty 4

## 2024-02-25 MED ORDER — ONDANSETRON HCL 4 MG/2ML IJ SOLN
4.0000 mg | Freq: Once | INTRAMUSCULAR | Status: AC
  Administered 2024-02-25: 4 mg via INTRAVENOUS
  Filled 2024-02-25: qty 2

## 2024-02-25 MED ORDER — APIXABAN 2.5 MG PO TABS
2.5000 mg | ORAL_TABLET | Freq: Two times a day (BID) | ORAL | Status: DC
  Administered 2024-02-25 – 2024-02-28 (×8): 2.5 mg via ORAL
  Filled 2024-02-25 (×9): qty 1

## 2024-02-25 MED ORDER — FUROSEMIDE 10 MG/ML IJ SOLN
60.0000 mg | Freq: Two times a day (BID) | INTRAMUSCULAR | Status: DC
  Administered 2024-02-25 – 2024-02-26 (×3): 60 mg via INTRAVENOUS
  Filled 2024-02-25 (×2): qty 6
  Filled 2024-02-25: qty 8

## 2024-02-25 NOTE — ED Notes (Signed)
 Removed nitroglycerin  patch due to patient having 0/10 pain at this time.

## 2024-02-25 NOTE — ED Notes (Signed)
 Messaged pharmacy about missing dose of Breztri .

## 2024-02-25 NOTE — ED Provider Notes (Signed)
 [facility] Provider Note    Event Date/Time   First MD Initiated Contact with Patient 02/25/24 0123     (approximate)   History   Chest Pain   HPI  Leslie Parker is a 89 y.o. female with a history of CHF, atrial fibrillation, bradycardia status post pacemaker, CKD, diabetes, and COPD who presents with chest pain, acute onset around 10 PM, described as pressure-like, substernal in location, associated with some nausea, but not associated with any significant shortness of breath.  She states it feels as if it would get better if she could burp.  She had no relief with aspirin  and nitroglycerin  given by EMS.  She reports mild leg swelling.  I reviewed the past medical records.  The patient was admitted to the hospitalist service last month, discharged on 7/3, after presenting with shortness of breath and hypoxia thought to be due to an exacerbation of CHF.   Physical Exam   Triage Vital Signs: ED Triage Vitals  Encounter Vitals Group     BP --      Girls Systolic BP Percentile --      Girls Diastolic BP Percentile --      Boys Systolic BP Percentile --      Boys Diastolic BP Percentile --      Pulse --      Resp --      Temp --      Temp src --      SpO2 02/25/24 0116 96 %     Weight 02/25/24 0122 158 lb 11.7 oz (72 kg)     Height 02/25/24 0122 5' 9 (1.753 m)     Head Circumference --      Peak Flow --      Pain Score 02/25/24 0122 7     Pain Loc --      Pain Education --      Exclude from Growth Chart --     Most recent vital signs: Vitals:   02/25/24 0300 02/25/24 0600  BP: (!) 147/87 (!) 152/83  Pulse: (!) 59 60  Resp: 16 20  Temp: 97.7 F (36.5 C) 97.8 F (36.6 C)  SpO2: 100% 100%     General: Alert, uncomfortable appearing, no distress.  CV:  Good peripheral perfusion.  Normal heart sounds. Resp:  Normal effort.  Somewhat diminished breath sounds bilaterally. Abd:  No distention.  Other:  1+ bilateral lower extremity  edema.   ED Results / Procedures / Treatments   Labs (all labs ordered are listed, but only abnormal results are displayed) Labs Reviewed  COMPREHENSIVE METABOLIC PANEL WITH GFR - Abnormal; Notable for the following components:      Result Value   Glucose, Bld 120 (*)    BUN 87 (*)    Creatinine, Ser 3.22 (*)    Calcium 8.6 (*)    Albumin  3.4 (*)    GFR, Estimated 13 (*)    All other components within normal limits  CBC WITH DIFFERENTIAL/PLATELET - Abnormal; Notable for the following components:   RBC 3.70 (*)    Hemoglobin 11.4 (*)    RDW 16.0 (*)    All other components within normal limits  BRAIN NATRIURETIC PEPTIDE - Abnormal; Notable for the following components:   B Natriuretic Peptide >4,500.0 (*)    All other components within normal limits  TROPONIN I (HIGH SENSITIVITY) - Abnormal; Notable for the following components:   Troponin I (High Sensitivity) 38 (*)  All other components within normal limits  TROPONIN I (HIGH SENSITIVITY) - Abnormal; Notable for the following components:   Troponin I (High Sensitivity) 33 (*)    All other components within normal limits     EKG  ED ECG REPORT I, Waylon Cassis, the attending physician, personally viewed and interpreted this ECG.  Date: 02/25/2024 EKG Time: 0120 Rate: 60 Rhythm: AV dual paced rhythm QRS Axis: normal Intervals: normal ST/T Wave abnormalities: normal Narrative Interpretation: no evidence of acute ischemia    RADIOLOGY  Chest x-ray: I independently viewed and interpreted the images; there is some vascular congestion and small pleural effusions but no focal consolidation   PROCEDURES:  Critical Care performed: No  Procedures   MEDICATIONS ORDERED IN ED: Medications  ondansetron  (ZOFRAN ) injection 4 mg (4 mg Intravenous Given 02/25/24 0201)  morphine  (PF) 2 MG/ML injection 2 mg (2 mg Intravenous Given 02/25/24 0203)  furosemide  (LASIX ) injection 40 mg (40 mg Intravenous Given 02/25/24 0427)      IMPRESSION / MDM / ASSESSMENT AND PLAN / ED COURSE  I reviewed the triage vital signs and the nursing notes.  89 year old female with PMH as noted above presents with somewhat atypical, nonexertional chest pain for the last few hours which was not relieved by medication given by EMS.  Exam reveals some lower extremity edema.  O2 saturation is in the high 90s on room air.  Differential diagnosis includes, but is not limited to, musculoskeletal chest wall pain, GERD, ACS, CHF exacerbation.  I have a low suspicion for aortic dissection or other vascular etiology.  There is no clinical evidence for pulmonary embolism.  We will obtain basic labs, cardiac enzymes, BNP, chest x-ray, give analgesia, and reassess.  Patient's presentation is most consistent with acute presentation with potential threat to life or bodily function.  The patient is on the cardiac monitor to evaluate for evidence of arrhythmia and/or significant heart rate changes.   ----------------------------------------- 6:24 AM on 02/25/2024 -----------------------------------------  CMP shows a worsening creatinine from her recent baseline.  Troponins are minimally elevated x 2 which is consistent with her baseline.  BNP remains off scale high which appears to be her recent baseline.  The patient is requiring 2 L of oxygen now to maintain an O2 sat in the 90s.  Some of this could be related to the morphine .  She is still having chest pain.  Chest x-ray shows bilateral vascular congestion and effusions.  I think that the patient would benefit from some IV diuresis.  I have ordered a dose of IV Lasix  and will consult the hospitalist.   FINAL CLINICAL IMPRESSION(S) / ED DIAGNOSES   Final diagnoses:  Atypical chest pain  Chronic congestive heart failure, unspecified heart failure type (HCC)     Rx / DC Orders   ED Discharge Orders     None        Note:  This document was prepared using Dragon voice recognition  software and may include unintentional dictation errors.    Cassis Waylon, MD 02/25/24 [phone]

## 2024-02-25 NOTE — ED Notes (Signed)
 Titrated O2 down to 1L from 2L due to patients oxygen saturation maintaining at 100%.

## 2024-02-25 NOTE — ED Notes (Signed)
 Patient was changed. Peri care provided to pt. Patient was able to role indept. without assistance. Chux changed.

## 2024-02-25 NOTE — ED Triage Notes (Signed)
 Pt arrived via [facility] from home with c/o sudden onset of left sided chest pain accompanied by SOB. Pt reports pain as sharp, dull, aching pain. 1.5 Inch nitroglycerin  paste applied by EMS as well as 324mg  Aspirin  in route to ED.    **Pt has a pacemaker

## 2024-02-25 NOTE — ED Notes (Signed)
 Patient re-positioned in bed. Patient AOX4 denying chest pain at this time. 0/10 rated by patient.

## 2024-02-25 NOTE — ED Notes (Signed)
 Removed [place]. Patient on room air maintaining oxygen saturation in high 90s.

## 2024-02-25 NOTE — H&P (Signed)
 History and Physical    Patient: Leslie Parker FMW:[id number] DOB: 05-22-1933 DOA: 02/25/2024 DOS: the patient was seen and examined on 02/25/2024 PCP: Sherial Bail, MD  Patient coming from: Home  Chief Complaint:  Chief Complaint  Patient presents with   Chest Pain   HPI: Leslie Parker is a 89 y.o. female with medical history significant of  HFrEF, A-fib, CKD stage IV, COPD, diabetes, hypertension, bradycardia s/p pacemaker came to ED with complaint of nonradiating substernal chest pain that started while he was in bed last night.  She reports the pain was constant and lasted until she was given nitroglycerin .  She reports associated shortness shortness of breath.  She denies nausea, diaphoresis, palpitation, lower extremity swelling, cough. Of note, she was discharged on 7 3 after being treated for a heart failure exacerbation.  In the emergency department, she was hemodynamically stable saturating appropriately on 2 L nasal cannula.  She denies wearing oxygen at home.  Her BNP is chronically elevated greater than 4500, troponin slightly elevated but flat which appears to be her baseline.  Chest x-ray with bilateral vascular congestion and effusion. Admission requested for diuresis.  Review of Systems: Review of Systems  Constitutional:  Negative for chills, fever and weight loss.  Eyes:  Negative for double vision.  Respiratory:  Positive for shortness of breath. Negative for cough and sputum production.   Cardiovascular:  Positive for chest pain. Negative for palpitations, orthopnea and leg swelling.  Gastrointestinal:  Positive for heartburn. Negative for abdominal pain, blood in stool, constipation, diarrhea, nausea and vomiting.  Genitourinary:  Negative for dysuria and frequency.  Musculoskeletal:  Negative for back pain and myalgias.  Skin:  Negative for itching and rash.  Neurological:  Negative for dizziness, tingling and focal weakness.    Past Medical History:   Diagnosis Date   Acute kidney injury superimposed on CKD (HCC) 11/24/2022   Asthma    Atrial fibrillation with RVR (HCC)    Cancer (HCC)    Basal Cell   CHF (congestive heart failure) (HCC)    Closed left hip fracture (HCC) 01/25/2017   Closed right hip fracture (HCC) 02/13/2023   Diabetes mellitus without complication (HCC)    Femur fracture, left (HCC) 02/03/2015   Heart murmur    Hypertension    Impetigo    Osteoarthritis    Osteopenia    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   Rapid atrial fibrillation, new onset(HCC) 06/05/2022   Vomiting    can not due to surgery   Wears dentures    full upper and lower   Past Surgical History:  Procedure Laterality Date   ABDOMINAL HYSTERECTOMY     BACK SURGERY     BLADDER SURGERY     mesh   CARPAL TUNNEL RELEASE Bilateral    CATARACT EXTRACTION Right 2017   CATARACT EXTRACTION W/PHACO Left 02/06/2016   Procedure: CATARACT EXTRACTION PHACO AND INTRAOCULAR LENS PLACEMENT (IOC) left eye;  Surgeon: Donzell Arlyce Budd, MD;  Location: [facility];  Service: Ophthalmology;  Laterality: Left;  DIABETIC LEFT Cannot arrive before 9:30   CERVICAL DISC SURGERY     CHOLECYSTECTOMY     EYE SURGERY Bilateral    Cataract Extraction with IOL   FEMUR IM NAIL Left 02/04/2015   Procedure: INTRAMEDULLARY (IM) NAIL FEMORAL;  Surgeon: Franky Cranker, MD;  Location: [facility];  Service: Orthopedics;  Laterality: Left;   HARDWARE REMOVAL Left 01/17/2017   Procedure: HARDWARE REMOVAL;  Surgeon: Cranker Franky, MD;  Location[facility];  Service: Orthopedics;  Laterality: Left;   HERNIA REPAIR  2014   esophageal and gastric mesh. patient unable to throw up d/t mesh   HIP ARTHROPLASTY Left 01/26/2017   Procedure: ARTHROPLASTY BIPOLAR HIP (HEMIARTHROPLASTY) removal hardware left hip;  Surgeon: Cleotilde Barrio, MD;  Location: [facility];  Service: Orthopedics;  Laterality: Left;   INTRAMEDULLARY (IM) NAIL INTERTROCHANTERIC Right 02/13/2023    Procedure: INTRAMEDULLARY (IM) NAIL INTERTROCHANTERIC;  Surgeon: Edie Norleen PARAS, MD;  Location: [facility];  Service: Orthopedics;  Laterality: Right;   JOINT REPLACEMENT Bilateral    knees   PACEMAKER IMPLANT N/A 11/15/2022   Procedure: PACEMAKER IMPLANT;  Surgeon: Ammon Blunt, MD;  Location: [facility];  Service: Cardiovascular;  Laterality: N/A;   PACEMAKER IMPLANT N/A 11/28/2022   Procedure: PACEMAKER IMPLANT;  Surgeon: Ammon Blunt, MD;  Location: [facility];  Service: Cardiovascular;  Laterality: N/A;  Lead reposition   REPLACEMENT TOTAL KNEE BILATERAL Bilateral [id number]   SHOULDER ARTHROSCOPY WITH OPEN ROTATOR CUFF REPAIR AND DISTAL CLAVICLE ACROMINECTOMY Left 10/25/2016   Procedure: SHOULDER ARTHROSCOPY WITH OPEN ROTATOR CUFF REPAIR AND DISTAL CLAVICLE ACROMINECTOMY;  Surgeon: Franky Cranker, MD;  Location: [facility];  Service: Orthopedics;  Laterality: Left;   THYROID  SURGERY     goiter removed   TOTAL HIP REVISION Left 12/02/2017   Procedure: TOTAL HIP REVISION;  Surgeon: Leora Lynwood SAUNDERS, MD;  Location: [facility];  Service: Orthopedics;  Laterality: Left;   TOTAL SHOULDER REPLACEMENT Right 2012   Social History:  reports that she has never smoked. She has never used smokeless tobacco. She reports that she does not drink alcohol and does not use drugs.  Allergies  Allergen Reactions   Baclofen Other (See Comments)    Elevated Cr   Simvastatin Other (See Comments)    Pt denies  Elevated LFTs with simva 40mg , stopped and resolved (see MD note 11/10/13)    Family History  Problem Relation Age of Onset   Heart failure Mother    Hypertension Mother    Emphysema Father     Prior to Admission medications   Medication Sig Start Date End Date Taking? Authorizing Provider  acetaminophen  (TYLENOL ) 500 MG tablet Take 2 tablets (1,000 mg total) by mouth 3 (three) times daily. 11/29/23   Maree Hue, MD  albuterol  (VENTOLIN  HFA) 108 (90 Base) MCG/ACT inhaler  Inhale 2 puffs into the lungs every 6 (six) hours as needed. 08/04/19   [provider]  alum & mag hydroxide-simeth (MAALOX/MYLANTA) 200-200-20 MG/5ML suspension Take 30 mLs by mouth every 6 (six) hours as needed for indigestion or heartburn. 11/29/23   Maree Hue, MD  amiodarone  (PACERONE ) 200 MG tablet Take 1 tablet by mouth daily. 08/26/23   [provider]  apixaban  (ELIQUIS ) 2.5 MG TABS tablet Take 1 tablet (2.5 mg total) by mouth 2 (two) times daily. 06/07/22   Jhonny Calvin NOVAK, MD  ascorbic acid  (VITAMIN C ) 250 MG tablet Take 250 mg by mouth daily.    [provider]  carvedilol  (COREG ) 6.25 MG tablet Take 1 tablet (6.25 mg total) by mouth 2 (two) times daily with a meal. 01/23/24 02/22/24  Maree Hue, MD  Cholecalciferol  (VITAMIN D -1000 MAX ST) 25 MCG (1000 UT) tablet Take 1,000 Units by mouth daily.    [provider]  colestipol  (COLESTID ) 1 g tablet Take 2 g by mouth.  Take 2 g by mouth 2 (two) times daily    [provider]  Cyanocobalamin  (B-12) 2500 MCG TABS  Take 2,500 mcg by mouth daily.    [provider]  isosorbide  mononitrate (IMDUR ) 30 MG 24 hr tablet Take 30 mg by mouth.  Take 30 mg by mouth at bedtime    [provider]  JARDIANCE  10 MG TABS tablet  11/26/23   [provider]  losartan  (COZAAR ) 25 MG tablet Take 0.5 tablets (12.5 mg total) by mouth daily. 01/23/24 03/23/24  Maree Hue, MD  Multiple Vitamin (MULTIVITAMIN WITH MINERALS) TABS tablet Take 1 tablet by mouth daily. One-A-Day Women's Vitamin    [provider]  ondansetron  (ZOFRAN ) 4 MG tablet Take 1 tablet (4 mg total) by mouth every 6 (six) hours as needed for nausea. 11/17/23   Josette Ade, MD  polyethylene glycol (MIRALAX  / GLYCOLAX ) 17 g packet Take 17 g by mouth daily as needed. 11/29/23   Maree Hue, MD  potassium chloride  SA (KLOR-CON  M) 20 MEQ tablet Take 2 tablets (40 mEq total) by mouth daily. 02/13/24   Donette Ellouise LABOR, FNP   senna-docusate (SENOKOT-S) 8.6-50 MG tablet Take 2 tablets by mouth at bedtime as needed for mild constipation. 11/29/23   Maree Hue, MD  torsemide  (DEMADEX ) 20 MG tablet Take 2 tablets (40 mg total) by mouth in the morning AND 1 tablet (20 mg total) daily in the afternoon. 02/13/24 05/13/24  Donette Ellouise A, FNP  TRELEGY ELLIPTA 100-62.5-25 MCG/ACT AEPB Inhale 1 puff into the lungs daily. 08/03/22   [provider]    Physical Exam: Vitals:   02/25/24 0600 02/25/24 0700 02/25/24 0715 02/25/24 0716  BP: (!) 152/83 (!) 156/84    Pulse: 60 60    Resp: 20 15    Temp: 97.8 F (36.6 C)     TempSrc: Oral     SpO2: 100% 100% 100% 100%  Weight:      Height:       Physical Exam Vitals and nursing note reviewed.  Constitutional:      General: She is not in acute distress.    Appearance: She is not ill-appearing or diaphoretic.  HENT:     Head: Normocephalic and atraumatic.  Eyes:     Extraocular Movements: Extraocular movements intact.     Pupils: Pupils are equal, round, and reactive to light.  Cardiovascular:     Rate and Rhythm: Normal rate and regular rhythm.  Pulmonary:     Effort: Pulmonary effort is normal. No tachypnea or respiratory distress.     Breath sounds: No wheezing.  Musculoskeletal:     Right lower leg: Edema present.     Left lower leg: No tenderness. Edema present.  Skin:    General: Skin is warm and dry.     Capillary Refill: Capillary refill takes less than 2 seconds.  Neurological:     General: No focal deficit present.     Mental Status: She is alert and oriented to person, place, and time.  Psychiatric:        Mood and Affect: Mood normal.        Behavior: Behavior normal.     Data Reviewed:   Labs on Admission: I have personally reviewed following labs and imaging studies  CBC: Recent Labs  Lab 02/25/24 0117  WBC 4.0  NEUTROABS 2.2  HGB 11.4*  HCT 36.2  MCV 97.8  PLT 184   Basic Metabolic Panel: Recent Labs  Lab 02/25/24 0117   NA 139  K 4.4  CL 104  CO2 24  GLUCOSE 120*  BUN 87*  CREATININE  3.22*  CALCIUM 8.6*   GFR: Estimated Creatinine Clearance: 12.4 mL/min (A) (by C-G formula based on SCr of 3.22 mg/dL (H)). Liver Function Tests: Recent Labs  Lab 02/25/24 0117  AST 33  ALT 26  ALKPHOS 76  BILITOT 0.7  PROT 6.9  ALBUMIN  3.4*   No results for input(s): LIPASE, AMYLASE in the last 168 hours. No results for input(s): AMMONIA in the last 168 hours. Coagulation Profile: No results for input(s): INR, PROTIME in the last 168 hours. Cardiac Enzymes: No results for input(s): CKTOTAL, CKMB, CKMBINDEX, TROPONINI in the last 168 hours. BNP (last 3 results) No results for input(s): PROBNP in the last 8760 hours. HbA1C: No results for input(s): HGBA1C in the last 72 hours. CBG: No results for input(s): GLUCAP in the last 168 hours. Lipid Profile: No results for input(s): CHOL, HDL, LDLCALC, TRIG, CHOLHDL, LDLDIRECT in the last 72 hours. Thyroid  Function Tests: No results for input(s): TSH, T4TOTAL, FREET4, T3FREE, THYROIDAB in the last 72 hours. Anemia Panel: No results for input(s): VITAMINB12, FOLATE, FERRITIN, TIBC, IRON, RETICCTPCT in the last 72 hours. Urine analysis:    Component Value Date/Time   COLORURINE YELLOW (A) 11/05/2023 1505   APPEARANCEUR CLEAR (A) 11/05/2023 1505   APPEARANCEUR Clear 02/19/2013 0254   LABSPEC 1.012 11/05/2023 1505   LABSPEC 1.006 02/19/2013 0254   PHURINE 6.0 11/05/2023 1505   GLUCOSEU NEGATIVE 11/05/2023 1505   GLUCOSEU Negative 02/19/2013 0254   HGBUR NEGATIVE 11/05/2023 1505   BILIRUBINUR NEGATIVE 11/05/2023 1505   BILIRUBINUR Negative 02/19/2013 0254   KETONESUR NEGATIVE 11/05/2023 1505   PROTEINUR 100 (A) 11/05/2023 1505   NITRITE NEGATIVE 11/05/2023 1505   LEUKOCYTESUR NEGATIVE 11/05/2023 1505   LEUKOCYTESUR 1+ 02/19/2013 0254    Radiological Exams on Admission: DG Chest Port 1  View Result Date: 02/25/2024 EXAM: 1 VIEW XRAY OF THE CHEST 02/25/2024 01:48:50 AM COMPARISON: None available. CLINICAL HISTORY: 355200 Chest pain [id number]. PER ER NOTE; Pt arrived via [facility] from home with c/o sudden onset of left sided chest pain accompanied by SOB. Pt reports pain as sharp, dull, aching pain. 1.5 Inch nitroglycerin  paste applied by EMS as well as 324mg  Aspirin  in route to ED. ; ; **Pt has a pacemaker FINDINGS: LUNGS AND PLEURA: Small bilateral pleural effusions with basilar atelectasis. Pulmonary vascular congestion. HEART AND MEDIASTINUM: Mild cardiomegaly with calcific aortic atherosclerosis. BONES AND SOFT TISSUES: Unchanged position of left chest wall pacemaker. IMPRESSION: 1. Mild cardiomegaly with calcific aortic atherosclerosis and pulmonary vascular congestion. 2. Small bilateral pleural effusions with basilar atelectasis. Electronically signed by: Franky Stanford MD 02/25/2024 02:29 AM EDT RP Workstation: [id number]       Assessment and Plan: No notes have been filed under this hospital service. Service: Hospitalist   89 y.o. female with medical history significant of  HFrEF, A-fib, CKD stage IV, COPD, diabetes, hypertension, bradycardia s/p pacemaker presented for evaluation of chest pain, admitted for acute hypoxic respiratory failure in the setting of AoCHF.  Multiple hospitalizations for the same. Echo in June of this year with EF 25 to 30%, grade 2 diastolic dysfunction, mild MR, mild LVH  Acute hypoxic respiratory failure Acute on chronic combined systolic and diastolic CHF (congestive heart failure)  Chest pain  - heart score 5, however troponins elevated and flat in setting of CKD.  Requiring 2 L nasal cannula - Continue imdur  and coreg   - Monitor on telemetry - IV Lasix , monitor I/O - Wean O2 as tolerated  Acute hypoxic respiratory failure (HCC) COPD Patient was  currently on 2 L of oxygen.  Likely due to CHF exacerbation.  No wheezing. Continue breztri      Chronic a-fib (HCC) Sick sinus syndrome  Rate controlled.  Status post pacemaker -Continue home amiodarone  and Eliquis    CKD (chronic kidney disease) stage 4, GFR 15-29 ml/min (HCC) Hold ARB while receiving IV diuresis  Hold Jardiance  given GFR <30    HTN - cont imdur  and coreg , resume hydralazine  and ARB as indicated   Eliquis   No ivf  Monitor/replace electrolytes  Cardiac diet   Advance Care Planning:   Code Status: Prior discussed with patient about admission     Severity of Illness: The appropriate patient status for this patient is OBSERVATION. Observation status is judged to be reasonable and necessary in order to provide the required intensity of service to ensure the patient's safety. The patient's presenting symptoms, physical exam findings, and initial radiographic and laboratory data in the context of their medical condition is felt to place them at decreased risk for further clinical deterioration. Furthermore, it is anticipated that the patient will be medically stable for discharge from the hospital within 2 midnights of admission.   Author: Daved JAYSON Pump, DO 02/25/2024 7:26 AM  For on call review [email or web address].

## 2024-02-26 DIAGNOSIS — J9601 Acute respiratory failure with hypoxia: Secondary | ICD-10-CM

## 2024-02-26 DIAGNOSIS — Z7189 Other specified counseling: Secondary | ICD-10-CM | POA: Diagnosis not present

## 2024-02-26 DIAGNOSIS — I5023 Acute on chronic systolic (congestive) heart failure: Secondary | ICD-10-CM

## 2024-02-26 DIAGNOSIS — I482 Chronic atrial fibrillation, unspecified: Secondary | ICD-10-CM

## 2024-02-26 LAB — COMPREHENSIVE METABOLIC PANEL WITH GFR
ALT: 19 U/L (ref 0–44)
AST: 24 U/L (ref 15–41)
Albumin: 2.8 g/dL — ABNORMAL LOW (ref 3.5–5.0)
Alkaline Phosphatase: 64 U/L (ref 38–126)
Anion gap: 10 (ref 5–15)
BUN: 81 mg/dL — ABNORMAL HIGH (ref 8–23)
CO2: 25 mmol/L (ref 22–32)
Calcium: 8.2 mg/dL — ABNORMAL LOW (ref 8.9–10.3)
Chloride: 103 mmol/L (ref 98–111)
Creatinine, Ser: 3.3 mg/dL — ABNORMAL HIGH (ref 0.44–1.00)
GFR, Estimated: 13 mL/min — ABNORMAL LOW (ref >60)
Glucose, Bld: 129 mg/dL — ABNORMAL HIGH (ref 70–99)
Potassium: 4.1 mmol/L (ref 3.5–5.1)
Sodium: 138 mmol/L (ref 135–145)
Total Bilirubin: 0.8 mg/dL (ref 0.0–1.2)
Total Protein: 5.5 g/dL — ABNORMAL LOW (ref 6.5–8.1)

## 2024-02-26 LAB — MAGNESIUM: Magnesium: 1.9 mg/dL (ref 1.7–2.4)

## 2024-02-26 NOTE — Plan of Care (Signed)

## 2024-02-26 NOTE — TOC Progression Note (Signed)
 Transition of Care [facility]) - Progression Note    Patient Details  Name: Leslie Parker MRN: [id number] Date of Birth: 15-Nov-1932  Transition of Care [facility]) CM/SW Contact  K'La JINNY Ruts, LCSW Phone Number: 02/26/2024, 12:42 PM  Clinical Narrative:    Chart reviewed. Larraine called back and confirmed that the patient is active with [facility] and receives Nursing and home health aid for services.                     Expected Discharge Plan and Services                                               Social Drivers of Health (SDOH) Interventions SDOH Screenings   Food Insecurity: No Food Insecurity (02/25/2024)  Housing: Low Risk  (02/25/2024)  Transportation Needs: No Transportation Needs (02/25/2024)  Utilities: Not At Risk (02/25/2024)  Alcohol Screen: Low Risk  (08/20/2023)  Financial Resource Strain: Low Risk  (01/13/2024)   Received from [facility]  Social Connections: Moderately Isolated (02/25/2024)  Tobacco Use: Low Risk  (02/25/2024)    Readmission Risk Interventions    10/22/2023    2:28 PM 09/16/2023    2:52 PM 08/20/2023    3:45 PM  Readmission Risk Prevention Plan  Transportation Screening Complete Complete Complete  Medication Review Oceanographer) Complete Complete Complete  PCP or Specialist appointment within 3-5 days of discharge  Complete Complete  HRI or Home Care Consult Not Complete Patient refused Complete  HRI or Home Care Consult Pt Refusal Comments No PT recs at this time.    SW Recovery Care/Counseling Consult Complete Complete Complete  Palliative Care Screening Not Applicable Not Applicable Not Applicable  Skilled Nursing Facility Not Applicable Not Applicable Not Applicable

## 2024-02-26 NOTE — Consult Note (Signed)
 Consultation Note Date: 02/26/2024   Patient Name: Leslie Parker  DOB: 16-Apr-1933  MRN: [id number]  Age / Sex: 89 y.o., female  PCP: Sherial Bail, MD Referring Physician: Leesa Kast, DO  Reason for Consultation: Establishing goals of care  HPI/Patient Profile: PER H&P :Leslie Parker is a 89 y.o. female with medical history significant of  HFrEF, A-fib, CKD stage IV, COPD, diabetes, hypertension, bradycardia s/p pacemaker came to ED with complaint of nonradiating substernal chest pain that started while he was in bed last night.  She reports the pain was constant and lasted until she was given nitroglycerin .  She reports associated shortness shortness of breath.  She denies nausea, diaphoresis, palpitation, lower extremity swelling, cough. Of note, she was discharged on 7/3 after being treated for a heart failure exacerbation.   Clinical Assessment and Goals of Care: Notes and labs reviewed.  Patient is known to me from previous admissions.  In most recent admission at the beginning of July, patient desired a DNR/DNI status but was clear that she would want to return to the hospital as needed to treat the treatable.  She has maintained that her granddaughter Leslie Parker would be her H POA, and document is noted under the ACP tab.  She states she has been feeling better over this past month than she had been previously.  She states she came in because of chest pain.  We briefly discussed her renal function.  Staff in to work and clean the other side of her semiprivate room where patient was just discharged, and with noise and distraction patient stated that she was promised a private room and would like to move to a private room before speaking further.  Charge RN made aware.  SUMMARY OF RECOMMENDATIONS   Patient would like to be placed in a private room before discussing goals of care with PMT.       Primary Diagnoses: Present on Admission:  Acute on chronic HFrEF (heart failure with reduced ejection fraction) (HCC)  Chronic a-fib (HCC)  Acute hypoxic respiratory failure (HCC)   I have reviewed the medical record, interviewed the patient and family, and examined the patient. The following aspects are pertinent.  Past Medical History:  Diagnosis Date   Acute kidney injury superimposed on CKD (HCC) 11/24/2022   Asthma    Atrial fibrillation with RVR (HCC)    Cancer (HCC)    Basal Cell   CHF (congestive heart failure) (HCC)    Closed left hip fracture (HCC) 01/25/2017   Closed right hip fracture (HCC) 02/13/2023   Diabetes mellitus without complication (HCC)    Femur fracture, left (HCC) 02/03/2015   Heart murmur    Hypertension    Impetigo    Osteoarthritis    Osteopenia    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   Rapid atrial fibrillation, new onset(HCC) 06/05/2022   Vomiting    can not due to surgery   Wears dentures    full upper and lower   Social History   Socioeconomic History  Marital status: Divorced    Spouse name: Not on file   Number of children: 4   Years of education: Not on file   Highest education level: 12th grade  Occupational History   Occupation: Retitred/Disability  Tobacco Use   Smoking status: Never   Smokeless tobacco: Never  Vaping Use   Vaping status: Never Used  Substance and Sexual Activity   Alcohol use: No    Alcohol/week: 0.0 standard drinks of alcohol   Drug use: No   Sexual activity: Not Currently  Other Topics Concern   Not on file  Social History Narrative   Not on file   Social Drivers of Health   Financial Resource Strain: Low Risk  (01/13/2024)   Received from [facility]   Overall Financial Resource Strain (CARDIA)    Difficulty of Paying Living Expenses: Not hard at all  Food Insecurity: No Food Insecurity (02/25/2024)   Hunger Vital Sign    Worried About Running Out of Food in the Last Year:  Never true    Ran Out of Food in the Last Year: Never true  Transportation Needs: No Transportation Needs (02/25/2024)   PRAPARE - Administrator, Civil Service (Medical): No    Lack of Transportation (Non-Medical): No  Physical Activity: Not on file  Stress: Not on file  Social Connections: Moderately Isolated (02/25/2024)   Social Connection and Isolation Panel    Frequency of Communication with Friends and Family: More than three times a week    Frequency of Social Gatherings with Friends and Family: Three times a week    Attends Religious Services: 1 to 4 times per year    Active Member of Clubs or Organizations: No    Attends Banker Meetings: Never    Marital Status: Divorced   Family History  Problem Relation Age of Onset   Heart failure Mother    Hypertension Mother    Emphysema Father    Scheduled Meds:  acetaminophen   1,000 mg Oral TID   amiodarone   200 mg Oral Daily   apixaban   2.5 mg Oral BID   budesonide -glycopyrrolate -formoterol   2 puff Inhalation BID   carvedilol   6.25 mg Oral BID WC   isosorbide  mononitrate  30 mg Oral Daily   potassium chloride  SA  40 mEq Oral Daily   Continuous Infusions: PRN Meds:.alum & mag hydroxide-simeth, ondansetron , polyethylene glycol Medications Prior to Admission:  Prior to Admission medications   Medication Sig Start Date End Date Taking? Authorizing Provider  acetaminophen  (TYLENOL ) 500 MG tablet Take 2 tablets (1,000 mg total) by mouth 3 (three) times daily. 11/29/23  Yes Maree Hue, MD  albuterol  (VENTOLIN  HFA) 108 (90 Base) MCG/ACT inhaler Inhale 2 puffs into the lungs every 6 (six) hours as needed. 08/04/19  Yes [provider]  alum & mag hydroxide-simeth (MAALOX/MYLANTA) 200-200-20 MG/5ML suspension Take 30 mLs by mouth every 6 (six) hours as needed for indigestion or heartburn. 11/29/23  Yes Maree Hue, MD  amiodarone  (PACERONE ) 200 MG tablet Take 1 tablet by mouth daily. 08/26/23  Yes [provider]  apixaban  (ELIQUIS ) 2.5 MG TABS tablet Take 1 tablet (2.5 mg total) by mouth 2 (two) times daily. 06/07/22  Yes Sreenath, Sudheer B, MD  ascorbic acid  (VITAMIN C ) 250 MG tablet Take 250 mg by mouth daily.   Yes [provider]  carvedilol  (COREG ) 6.25 MG tablet Take 1 tablet (6.25 mg total) by mouth 2 (two) times daily with a meal.  01/23/24 02/25/24 Yes Maree Hue, MD  Cholecalciferol  (VITAMIN D -1000 MAX ST) 25 MCG (1000 UT) tablet Take 1,000 Units by mouth daily.   Yes [provider]  Cyanocobalamin  (B-12) 2500 MCG TABS Take 2,500 mcg by mouth daily.   Yes [provider]  losartan  (COZAAR ) 25 MG tablet Take 0.5 tablets (12.5 mg total) by mouth daily. 01/23/24 03/23/24 Yes Maree Hue, MD  metoprolol  tartrate (LOPRESSOR ) 25 MG tablet Take 25 mg by mouth 2 (two) times daily. 02/10/24  Yes [provider]  Multiple Vitamin (MULTIVITAMIN WITH MINERALS) TABS tablet Take 1 tablet by mouth daily. One-A-Day Women's Vitamin   Yes [provider]  ondansetron  (ZOFRAN ) 4 MG tablet Take 1 tablet (4 mg total) by mouth every 6 (six) hours as needed for nausea. 11/17/23  Yes Wieting, Richard, MD  polyethylene glycol (MIRALAX  / GLYCOLAX ) 17 g packet Take 17 g by mouth daily as needed. 11/29/23  Yes Maree Hue, MD  potassium chloride  SA (KLOR-CON  M) 20 MEQ tablet Take 2 tablets (40 mEq total) by mouth daily. 02/13/24  Yes Hackney, Tina A, FNP  senna-docusate (SENOKOT-S) 8.6-50 MG tablet Take 2 tablets by mouth at bedtime as needed for mild constipation. 11/29/23  Yes Maree Hue, MD  torsemide  (DEMADEX ) 20 MG tablet Take 2 tablets (40 mg total) by mouth in the morning AND 1 tablet (20 mg total) daily in the afternoon. 02/13/24 05/13/24 Yes Hackney, Ellouise A, FNP  colestipol  (COLESTID ) 1 g tablet Take 2 g by mouth.  Take 2 g by mouth 2 (two) times daily Patient not taking: Reported on 02/25/2024    [provider]  hydrALAZINE  (APRESOLINE ) 25 MG tablet 25 mg 3  (three) times daily.    [provider]  isosorbide  mononitrate (IMDUR ) 30 MG 24 hr tablet Take 30 mg by mouth.  Take 30 mg by mouth at bedtime Patient not taking: Reported on 02/25/2024    [provider]  JARDIANCE  10 MG TABS tablet  11/26/23   [provider]  TRELEGY ELLIPTA 100-62.5-25 MCG/ACT AEPB Inhale 1 puff into the lungs daily. Patient not taking: Reported on 02/25/2024 08/03/22   [provider]   Allergies  Allergen Reactions   Baclofen Other (See Comments)    Elevated Cr   Simvastatin Other (See Comments)    Pt denies  Elevated LFTs with simva 40mg , stopped and resolved (see MD note 11/10/13)   Review of Systems  All other systems reviewed and are negative.   Physical Exam Pulmonary:     Effort: Pulmonary effort is normal.  Skin:    General: Skin is warm and dry.  Neurological:     Mental Status: She is alert.     Vital Signs: BP 123/74 (BP Location: Right Arm)   Pulse 60   Temp 97.7 F (36.5 C)   Resp 18   Ht 5' 9 (1.753 m)   Wt 72 kg   SpO2 96%   BMI 23.44 kg/m  Pain Scale: 0-10   Pain Score: 0-No pain   SpO2: SpO2: 96 % O2 Device:SpO2: 96 % O2 Flow Rate: .O2 Flow Rate (L/min): 1 L/min  IO: Intake/output summary:  Intake/Output Summary (Last 24 hours) at 02/26/2024 1559 Last data filed at 02/26/2024 1500 Gross per 24 hour  Intake 420 ml  Output 1300 ml  Net -880 ml    LBM: Last BM Date : 02/24/24 Baseline Weight: Weight: 72 kg Most recent weight: Weight: 72 kg      Signed by:  [name] Lewis, NP   Please contact Palliative Medicine Team phone at [phone] for questions and concerns.  For individual provider: See Tracey

## 2024-02-26 NOTE — Progress Notes (Signed)
 PROGRESS NOTE    Leslie Parker  FMW:[id number] DOB: 1932/09/04 DOA: 02/25/2024 PCP: Sherial Bail, MD  Chief Complaint  Patient presents with   Chest Pain    Hospital Course:  Leslie Parker is an 89 year old female with heart failure reduced EF, A-fib, CKD stage IV, COPD, diabetes, hypertension, bradycardia status post pacemaker who presents to the ED complaining of nonradiating substernal chest pain that started while she was in bed last night.  Patient was recently discharged 7/3 and during that admission was treated for heart failure exacerbation.  In the ED she was hemodynamically stable on 2 L nasal cannula, BNP chronically elevated above 4500, troponin slightly elevated but flat which appears consistent with baseline.  CXR revealed bilateral vascular congestion and effusions.  She was admitted for diuresis  Subjective: This morning she reports she is feeling much better.  She is currently off of oxygen.  Denies any lower extremity edema.   Objective: Vitals:   02/25/24 2318 02/26/24 0346 02/26/24 0718 02/26/24 1207  BP: 125/65 130/72 115/64 108/67  Pulse: 60 64 60 61  Resp: 18 18 18 16   Temp: (!) 97.4 F (36.3 C) 97.9 F (36.6 C) 97.7 F (36.5 C) 97.7 F (36.5 C)  TempSrc:      SpO2: 94% (!) 89% 94% 93%  Weight:      Height:        Intake/Output Summary (Last 24 hours) at 02/26/2024 1512 Last data filed at 02/26/2024 1300 Gross per 24 hour  Intake 1120 ml  Output --  Net 1120 ml   Filed Weights   02/25/24 0122  Weight: 72 kg    Examination: General exam: Appears calm and comfortable, NAD  Respiratory system: No work of breathing, symmetric chest wall expansion. CTAB Cardiovascular system: S1 & S2 heard, RRR.  Gastrointestinal system: Abdomen is nondistended, soft and nontender.  Neuro: Alert and oriented. No focal neurological deficits. Extremities: Symmetric, expected ROM, no lower extremity edema Skin: No rashes, lesions Psychiatry: Demonstrates  appropriate judgement and insight. Mood & affect appropriate for situation.   Assessment & Plan:  Principal Problem:   Acute on chronic HFrEF (heart failure with reduced ejection fraction) (HCC) Active Problems:   Acute hypoxic respiratory failure (HCC)   Chronic a-fib (HCC)   CHF exacerbation (HCC)   Acute hypoxic respiratory failure Acute on chronic combined systolic and diastolic CHF Chest pain - Echo 01/21/2024: EF 25 to 30%, mild LVH, grade 2 diastolic dysfunction, normal RV, mild MR. - Heart score 5, troponins elevated and flat in setting of CKD and heart failure exacerbation - Requiring 2 L nasal cannula - Continue with Imdur  and Coreg  - Continue monitor on telemetry - Continue monitoring I's and O's, no output recorded in the last 24 hours - Has been on IV Lasix  but she has no peripheral edema, and is stable on room air.  BUN is elevating creatinine is not tolerating.  Will discontinue further Lasix .  Acute hypoxic respiratory failure COPD - Required 2 L on arrival likely secondary to CHF exacerbation.  No wheezing on exam. - Continue Breztri . - Now stable on room air  Chronic A-fib Sick sinus syndrome - Currently rate controlled - Status post pacemaker - Continue home meds amiodarone  and Eliquis   AKI superimposed on CKD stage IV - Baseline creatinine: 2.2 - Creatinine on arrival: 3.2, now rising to 3.3. - BUN is rising.  Suspect patient is not tolerating diuresis.  Have discontinued IV Lasix .  Will monitor without. - Repeat creatinine in  a.m. - Consider nephrology involvement if not improving - Suspect patient is not an excellent candidate for dialysis given age and comorbidities.  Poor prognosis - Patient has had over 17 visits to the hospital in the last 12 months.  She has severe heart failure and kidney function is no longer tolerating IV diuresis - She is a poor candidate for hemodialysis.  She is progressively weaker with each visit. - Palliative care  consulted  DVT prophylaxis: Eliquis    Code Status: Full Code Disposition:  Currently lives alone, pending PT evals.  Patient has previously been offered SNF and denied.  Had extensive discussion with her granddaughter, POA today.  Her granddaughter does endorse significant concerns about the patient discharging home and believes that she is not safe on her own.  She reports the family has previously tried to encourage the patient to consider a facility or at least live-in help with the patient has been resistant.  Will continue these discussions daily  Consultants:    Procedures:    Antimicrobials:  Anti-infectives (From admission, onward)    None       Data Reviewed: I have personally reviewed following labs and imaging studies CBC: Recent Labs  Lab 02/25/24 0117  WBC 4.0  NEUTROABS 2.2  HGB 11.4*  HCT 36.2  MCV 97.8  PLT 184   Basic Metabolic Panel: Recent Labs  Lab 02/25/24 0117 02/26/24 0338  NA 139 138  K 4.4 4.1  CL 104 103  CO2 24 25  GLUCOSE 120* 129*  BUN 87* 81*  CREATININE 3.22* 3.30*  CALCIUM 8.6* 8.2*  MG  --  1.9   GFR: Estimated Creatinine Clearance: 12.1 mL/min (A) (by C-G formula based on SCr of 3.3 mg/dL (H)). Liver Function Tests: Recent Labs  Lab 02/25/24 0117 02/26/24 0338  AST 33 24  ALT 26 19  ALKPHOS 76 64  BILITOT 0.7 0.8  PROT 6.9 5.5*  ALBUMIN  3.4* 2.8*   CBG: No results for input(s): GLUCAP in the last 168 hours.  No results found for this or any previous visit (from the past 240 hours).   Radiology Studies: DG Chest Port 1 View Result Date: 02/25/2024 EXAM: 1 VIEW XRAY OF THE CHEST 02/25/2024 01:48:50 AM COMPARISON: None available. CLINICAL HISTORY: 355200 Chest pain [id number]. PER ER NOTE; Pt arrived via [facility] from home with c/o sudden onset of left sided chest pain accompanied by SOB. Pt reports pain as sharp, dull, aching pain. 1.5 Inch nitroglycerin  paste applied by EMS as well as 324mg  Aspirin  in route to ED. ; ; **Pt  has a pacemaker FINDINGS: LUNGS AND PLEURA: Small bilateral pleural effusions with basilar atelectasis. Pulmonary vascular congestion. HEART AND MEDIASTINUM: Mild cardiomegaly with calcific aortic atherosclerosis. BONES AND SOFT TISSUES: Unchanged position of left chest wall pacemaker. IMPRESSION: 1. Mild cardiomegaly with calcific aortic atherosclerosis and pulmonary vascular congestion. 2. Small bilateral pleural effusions with basilar atelectasis. Electronically signed by: Franky Stanford MD 02/25/2024 02:29 AM EDT RP Workstation: [id number]    Scheduled Meds:  acetaminophen   1,000 mg Oral TID   amiodarone   200 mg Oral Daily   apixaban   2.5 mg Oral BID   budesonide -glycopyrrolate -formoterol   2 puff Inhalation BID   carvedilol   6.25 mg Oral BID WC   furosemide   60 mg Intravenous Q12H   isosorbide  mononitrate  30 mg Oral Daily   potassium chloride  SA  40 mEq Oral Daily   Continuous Infusions:   LOS: 0 days  MDM: Patient is high risk for one  or more organ failure.  They necessitate ongoing hospitalization for continued IV therapies and subsequent lab monitoring. Total time spent interpreting labs and vitals, reviewing the medical record, coordinating care amongst consultants and care team members, directly assessing and discussing care with the patient and/or family: 55 min  Seferino Oscar, DO [facility]  To contact the attending physician between 7A-7P please use Epic Chat. To contact the covering physician during after hours 7P-7A, please review Amion.  02/26/2024, 3:13 PM   *This document has been created with the assistance of dictation software. Please excuse typographical errors. *

## 2024-02-26 NOTE — Progress Notes (Signed)
 PT Cancellation Note  Patient Details Name: MAKINNA ANDY MRN: [id number] DOB: [birth year]   Cancelled Treatment:    Reason Eval/Treat Not Completed: Other (comment) Chair reviewed, treatment attempted.  She was pleasant but completely uninterested in working with PT today.  Reports feeling too tired and weak today but states I'll feel better tomorrow, I'll do PT... I promise.  Carmin JONELLE Deed, DPT 02/26/2024, 4:17 PM

## 2024-02-26 NOTE — Care Management Obs Status (Signed)
 MEDICARE OBSERVATION STATUS NOTIFICATION   Patient Details  Name: Leslie Parker MRN: [id number] Date of Birth: 05-09-1933   Medicare Observation Status Notification Given:  No (patient did not want a copy)    [name] 02/26/2024, 11:21 AM

## 2024-02-26 NOTE — Care Management Obs Status (Signed)
 MEDICARE OBSERVATION STATUS NOTIFICATION   Patient Details  Name: NEHEMIE CASSERLY MRN: [id number] Date of Birth: 1933/07/16   Medicare Observation Status Notification Given:       [name] 02/26/2024, 11:21 AM

## 2024-02-26 NOTE — TOC Progression Note (Signed)
 Transition of Care [facility]) - Progression Note    Patient Details  Name: Leslie Parker MRN: [id number] Date of Birth: [birth year]  [facility] CM/SW Contact  K'La JINNY Ruts, LCSW Phone Number: 02/26/2024, 11:43 AM  Clinical Narrative:    Chart Reviewed. I called Larraine from [facility] to confirm if the patient is still receiving services with them and what kind of services. I am waiting for Larraine to get back with me.                      Expected Discharge Plan and Services                                               Social Drivers of Health (SDOH) Interventions SDOH Screenings   Food Insecurity: No Food Insecurity (02/25/2024)  Housing: Low Risk  (02/25/2024)  Transportation Needs: No Transportation Needs (02/25/2024)  Utilities: Not At Risk (02/25/2024)  Alcohol Screen: Low Risk  (08/20/2023)  Financial Resource Strain: Low Risk  (01/13/2024)   Received from [facility]  Social Connections: Moderately Isolated (02/25/2024)  Tobacco Use: Low Risk  (02/25/2024)    Readmission Risk Interventions    10/22/2023    2:28 PM 09/16/2023    2:52 PM 08/20/2023    3:45 PM  Readmission Risk Prevention Plan  Transportation Screening Complete Complete Complete  Medication Review Oceanographer) Complete Complete Complete  PCP or Specialist appointment within 3-5 days of discharge  Complete Complete  HRI or Home Care Consult Not Complete Patient refused Complete  HRI or Home Care Consult Pt Refusal Comments No PT recs at this time.    SW Recovery Care/Counseling Consult Complete Complete Complete  Palliative Care Screening Not Applicable Not Applicable Not Applicable  Skilled Nursing Facility Not Applicable Not Applicable Not Applicable

## 2024-02-27 ENCOUNTER — Encounter: Admitting: Family

## 2024-02-27 DIAGNOSIS — I482 Chronic atrial fibrillation, unspecified: Secondary | ICD-10-CM | POA: Diagnosis not present

## 2024-02-27 DIAGNOSIS — J9601 Acute respiratory failure with hypoxia: Secondary | ICD-10-CM | POA: Diagnosis not present

## 2024-02-27 DIAGNOSIS — I5023 Acute on chronic systolic (congestive) heart failure: Secondary | ICD-10-CM | POA: Diagnosis not present

## 2024-02-27 DIAGNOSIS — Z7189 Other specified counseling: Secondary | ICD-10-CM | POA: Diagnosis not present

## 2024-02-27 LAB — CBC WITH DIFFERENTIAL/PLATELET
Abs Immature Granulocytes: 0.01 K/uL (ref 0.00–0.07)
Basophils Absolute: 0 K/uL (ref 0.0–0.1)
Basophils Relative: 1 %
Eosinophils Absolute: 0 K/uL (ref 0.0–0.5)
Eosinophils Relative: 1 %
HCT: 30.6 % — ABNORMAL LOW (ref 36.0–46.0)
Hemoglobin: 9.9 g/dL — ABNORMAL LOW (ref 12.0–15.0)
Immature Granulocytes: 0 %
Lymphocytes Relative: 28 %
Lymphs Abs: 1 K/uL (ref 0.7–4.0)
MCH: 31.3 pg (ref 26.0–34.0)
MCHC: 32.4 g/dL (ref 30.0–36.0)
MCV: 96.8 fL (ref 80.0–100.0)
Monocytes Absolute: 0.3 K/uL (ref 0.1–1.0)
Monocytes Relative: 9 %
Neutro Abs: 2.3 K/uL (ref 1.7–7.7)
Neutrophils Relative %: 61 %
Platelets: 156 K/uL (ref 150–400)
RBC: 3.16 MIL/uL — ABNORMAL LOW (ref 3.87–5.11)
RDW: 15.9 % — ABNORMAL HIGH (ref 11.5–15.5)
WBC: 3.7 K/uL — ABNORMAL LOW (ref 4.0–10.5)
nRBC: 0 % (ref 0.0–0.2)

## 2024-02-27 LAB — COMPREHENSIVE METABOLIC PANEL WITH GFR
ALT: 18 U/L (ref 0–44)
AST: 20 U/L (ref 15–41)
Albumin: 2.8 g/dL — ABNORMAL LOW (ref 3.5–5.0)
Alkaline Phosphatase: 59 U/L (ref 38–126)
Anion gap: 10 (ref 5–15)
BUN: 85 mg/dL — ABNORMAL HIGH (ref 8–23)
CO2: 24 mmol/L (ref 22–32)
Calcium: 8.1 mg/dL — ABNORMAL LOW (ref 8.9–10.3)
Chloride: 102 mmol/L (ref 98–111)
Creatinine, Ser: 3.54 mg/dL — ABNORMAL HIGH (ref 0.44–1.00)
GFR, Estimated: 12 mL/min — ABNORMAL LOW (ref >60)
Glucose, Bld: 153 mg/dL — ABNORMAL HIGH (ref 70–99)
Potassium: 4.9 mmol/L (ref 3.5–5.1)
Sodium: 136 mmol/L (ref 135–145)
Total Bilirubin: 0.8 mg/dL (ref 0.0–1.2)
Total Protein: 5.4 g/dL — ABNORMAL LOW (ref 6.5–8.1)

## 2024-02-27 LAB — PHOSPHORUS: Phosphorus: 4.8 mg/dL — ABNORMAL HIGH (ref 2.5–4.6)

## 2024-02-27 LAB — MAGNESIUM: Magnesium: 1.6 mg/dL — ABNORMAL LOW (ref 1.7–2.4)

## 2024-02-27 NOTE — Plan of Care (Signed)
  Problem: Education: Goal: Knowledge of General Education information will improve Description: Including pain rating scale, medication(s)/side effects and non-pharmacologic comfort measures Outcome: Progressing   Problem: Clinical Measurements: Goal: Ability to maintain clinical measurements within normal limits will improve Outcome: Progressing Goal: Will remain free from infection Outcome: Progressing Goal: Diagnostic test results will improve Outcome: Progressing Goal: Respiratory complications will improve Outcome: Progressing Goal: Cardiovascular complication will be avoided Outcome: Progressing   Problem: Nutrition: Goal: Adequate nutrition will be maintained Outcome: Progressing   Problem: Elimination: Goal: Will not experience complications related to urinary retention Outcome: Progressing   Problem: Pain Managment: Goal: General experience of comfort will improve and/or be controlled Outcome: Progressing   Problem: Safety: Goal: Ability to remain free from injury will improve Outcome: Progressing   Problem: Skin Integrity: Goal: Risk for impaired skin integrity will decrease Outcome: Progressing

## 2024-02-27 NOTE — Progress Notes (Signed)
 Daily Progress Note   Patient Name: Leslie Parker       Date: 02/27/2024 DOB: 13-Nov-1932  Age: 89 y.o. MRN#: [id number] Attending Physician: Dezii, Alexandra, DO Primary Care Physician: Sherial Bail, MD Admit Date: 02/25/2024  Reason for Consultation/Follow-up: Establishing goals of care  Subjective: Notes and labs reviewed.  Patient states her breathing has been better over this past month.  She states she came in because of chest pain this admission.  She states she is waiting for nephrology to come in and speak with her about her kidney function.  She discusses her frequent admissions to the hospital and quickly states she wants to continue coming to the hospital  if indicated, and does not want hospice care. She states I am tired of everyone telling me about how bad I am, I know how bad I am, I am 89 years old.    She states she is needing to come to the hospital more frequently and from this point forward she wants to speak directly with the attending and specialist services that are consulted during the admission, as everyone keeps discussing the same information.  She states speaking with PMT does not prevent her from having these conversations with other providers, and so she just wants to speak directly with the treating teams.   PMT will sign off.  Please reconsult if patient requests to discuss goals of care with PMT.  Length of Stay: 0  Current Medications: Scheduled Meds:   acetaminophen   1,000 mg Oral TID   amiodarone   200 mg Oral Daily   apixaban   2.5 mg Oral BID   budesonide -glycopyrrolate -formoterol   2 puff Inhalation BID   carvedilol   6.25 mg Oral BID WC   isosorbide  mononitrate  30 mg Oral Daily   potassium chloride  SA  40 mEq Oral Daily    Continuous  Infusions:   PRN Meds: alum & mag hydroxide-simeth, ondansetron , polyethylene glycol  Physical Exam Pulmonary:     Effort: Pulmonary effort is normal.  Neurological:     Mental Status: She is alert.             Vital Signs: BP 119/71 (BP Location: Left Arm)   Pulse 60   Temp (!) 97.3 F (36.3 C)   Resp 20   Ht 5' 9 (1.753 m)  Wt 72 kg   SpO2 98%   BMI 23.44 kg/m  SpO2: SpO2: 98 % O2 Device: O2 Device: Room Air O2 Flow Rate: O2 Flow Rate (L/min): 1 L/min  Intake/output summary:  Intake/Output Summary (Last 24 hours) at 02/27/2024 1436 Last data filed at 02/27/2024 1300 Gross per 24 hour  Intake 0 ml  Output 950 ml  Net -950 ml   LBM: Last BM Date : 02/24/24 Baseline Weight: Weight: 72 kg Most recent weight: Weight: 72 kg   Patient Active Problem List   Diagnosis Date Noted   Protein-calorie malnutrition, severe 01/22/2024   Acute on chronic systolic CHF (congestive heart failure) (HCC) 12/14/2023   Scapular fracture 11/26/2023   Acute hypoxic respiratory failure (HCC) 11/15/2023   Do not resuscitate 11/10/2023   CHF exacerbation (HCC) 11/10/2023   Myocardial injury 11/09/2023   AKI (acute kidney injury) (HCC) 11/05/2023   Insomnia 11/05/2023   Frequent hospital admissions 11/05/2023   Acute exacerbation of CHF (congestive heart failure) (HCC) 10/22/2023   Elevated troponin 10/22/2023   Malnutrition of moderate degree 09/17/2023   CHF (congestive heart failure) (HCC) 09/15/2023   Acute on chronic HFrEF (heart failure with reduced ejection fraction) (HCC) 08/19/2023   Pleural effusion due to CHF (congestive heart failure) (HCC) 08/19/2023   HTN (hypertension), malignant 08/19/2023   Ileus (HCC) 08/12/2023   Large bowel obstruction (HCC) 08/12/2023   CKD (chronic kidney disease) stage 4, GFR 15-29 ml/min (HCC) 08/12/2023   HFrEF (heart failure with reduced ejection fraction) (HCC) 08/12/2023   Acute on chronic combined systolic and diastolic CHF (congestive  heart failure) (HCC) 08/10/2023   Hyponatremia 05/05/2023   Overweight (BMI 25.0-29.9) 05/01/2023   Left hip pain 05/01/2023   Hypokalemia 04/30/2023   Anemia 03/21/2023   Abnormal LFTs 03/20/2023   Type II diabetes mellitus with renal manifestations (HCC) 02/13/2023   Chronic diastolic CHF (congestive heart failure) (HCC) 02/13/2023   CKD stage 3 due to type 2 diabetes mellitus (HCC) 11/24/2022   Paroxysmal atrial fibrillation (HCC) 11/24/2022   Sick sinus syndrome (HCC) 11/15/2022   Acute on chronic diastolic CHF (congestive heart failure) (HCC) 08/27/2022   COPD (chronic obstructive pulmonary disease) (HCC) 08/23/2022   Chronic a-fib (HCC) 08/22/2022   Diabetes mellitus without complication (HCC)    Stage 3b chronic kidney disease (HCC) - Baseline scr 1.8-2.0    Postural dizziness with presyncope    Hypomagnesemia    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   PMT will sign off.  Please reconsult if patient requests to speak with PMT about goals of care.   Code Status:    Code Status Orders  (From admission, onward)           Start     Ordered   02/25/24 0900  Full code  Continuous       Question:  By:  Answer:  Consent: discussion documented in EHR   02/25/24 0902           Code Status History     Date Active Date Inactive Code Status Order ID Comments User Context   01/20/2024 1246 01/23/2024 1837 Limited: Do not attempt resuscitation (DNR) -DNR-LIMITED -Do Not Intubate/DNI  [id number]  Kandis Devaughn Sayres, MD ED   12/14/2023 1049 12/16/2023 1750 Limited: Do not attempt resuscitation (DNR) -DNR-LIMITED -Do Not Intubate/DNI   [id number]  Sherlon Brayton RAMAN, MD ED   11/26/2023 2013 11/29/2023 1830 Limited: Do not attempt resuscitation (DNR) -DNR-LIMITED -Do Not Intubate/DNI  [id number]  Arnett Saunders, MD ED   11/15/2023 1442 11/17/2023 1927 Limited: Do not attempt resuscitation (DNR) -DNR-LIMITED -Do Not Intubate/DNI  [id number]  Eldonna Elspeth PARAS, MD ED   11/10/2023 1015 11/13/2023 0151 Limited: Do not attempt resuscitation (DNR) -DNR-LIMITED -Do Not Intubate/DNI  [id number]  Marsa Edelman, DO ED   11/09/2023 1823 11/10/2023 1015 Full Code [id number]  Hilma Rankins, MD ED   11/05/2023 1235 11/07/2023 2347 Full Code [id number]  Cox, Amy N, DO ED   10/22/2023 1300 10/24/2023 1831 Full Code [id number]  Cox, Amy N, DO ED   10/11/2023 1144 10/14/2023 1909 Full Code [id number]  Laurita Cort DASEN, MD ED   09/15/2023 0830 09/19/2023 2010 Full Code [id number]  Laurita Cort DASEN, MD ED   08/19/2023 2258 08/23/2023 2148 Full Code [id number]  Hilma Rankins, MD ED   08/12/2023 0927 08/16/2023 2054 Full Code [id number]  Eldonna Elspeth PARAS, MD ED   08/07/2023 1626 08/11/2023 1912 Full Code [id number]  Eldonna Elspeth PARAS, MD ED   06/16/2023 1227 06/19/2023 1856 Full Code [id number]  Laurita Cort DASEN, MD ED   06/04/2023 1527 06/07/2023 1840 Full Code [id number]  Eldonna Elspeth PARAS, MD ED   04/30/2023 1659 05/09/2023 2220 Full Code [id number]  Lanetta Lingo, MD ED   03/21/2023 0012 04/01/2023 1618 Full Code [id number]  Tobie Mario GAILS, MD ED   02/13/2023 0822 02/15/2023 1825 Full Code [id number]  Hilma Rankins, MD ED   11/24/2022 2339 11/29/2022 1924 Full Code [id number]  Lanetta Lingo, MD ED   11/15/2022 0928 11/16/2022 1952 Full Code [id number]  Ammon Marsa, MD Inpatient   08/22/2022 2153 08/27/2022 1916 Full Code [id number]  Tobie Mario GAILS, MD ED   06/05/2022 2039 06/07/2022 1712 Full Code [id number]  Cleatus Delayne GAILS, MD ED   09/03/2020 0433 09/05/2020 1806 Full Code [id number]  Mansy, Madison LABOR, MD ED   12/02/2017 1152 12/04/2017 1731 Full Code [id number]  Leora Lynwood SAUNDERS, MD Inpatient   01/25/2017 1402  01/28/2017 2028 Full Code [id number]  Yisroel Sleight, MD ED   01/17/2017 1444 01/18/2017 1754 Full Code [id number]  Marchia Drivers, MD Inpatient   02/04/2015 1534 02/10/2015 2036 Full Code [id number]  Marchia Drivers, MD Inpatient   02/03/2015 1751 02/04/2015 1534 Full Code [id number]  Marchia Drivers, MD Inpatient   02/03/2015 1749 02/03/2015 1750 Full Code [id number]  Marchia Drivers, MD Inpatient   02/03/2015 1338 02/03/2015 1749 Full Code [id number]  Yisroel Sleight, MD ED      Advance Directive Documentation    Flowsheet Row Most Recent Value  Type of Advance Directive Healthcare Power of Attorney, Living will  Pre-existing out of facility DNR order (yellow form or pink MOST form) --  MOST Form in Place? --    Thank you for allowing the Palliative Medicine Team to assist in the care of this patient.   [name] Lewis, NP  Please contact Palliative Medicine Team phone at [phone] for questions and concerns.

## 2024-02-27 NOTE — Progress Notes (Addendum)
 Heart Failure Navigator Progress Note  Patient was scheduled for a follow-up appointment at [facility] today.  She is currently hospitalized so her appointment was changed to 03/05/24 @ 3:30 PM.  Navigator added it to her AVS for discharge and provided a appointment card at the bedside. Education Assessment and Provision:  Detailed education and instructions reviewed on heart failure disease management including the following:  Signs and symptoms of Heart Failure When to call the physician Importance of daily weights Low sodium diet Fluid restriction Medication management Anticipated future follow-up appointments  Patient education given on each of the above topics.  Patient acknowledges understanding via teach back method and acceptance of all instructions.  Education Materials:  Living Better With Heart Failure Booklet, HF zone tool, & Daily Weight Tracker Tool.  Patient has scale at home: Yes Patient has pill box at home: Yes    Navigator will sign off at this time.  Charmaine Pines, RN, BSN [facility] Heart Failure Navigator Secure Chat Only

## 2024-02-27 NOTE — CV Procedure (Signed)
  Device system confirmed to be MRI conditional, with implant date > 6 weeks ago, and no evidence of abandoned or epicardial leads in review of most recent CXR  Device last cleared by EP Provider: Daphne Barrack 02/27/24  Clearance is good through for 1 year as long as parameters remain stable at time of check. If pt undergoes a cardiac device procedure during that time, they should be re-cleared.   Tachy-therapies to be programmed off if applicable with device back to pre-MRI settings after completion of exam.  [employer] was available remotely to assist in programming recommendations.   Rocky Catalan, RT  02/27/2024 2:30 PM

## 2024-02-27 NOTE — Evaluation (Signed)
 Physical Therapy Evaluation Patient Details Name: Leslie Parker MRN: [id number] DOB: 11/07/1932 Today's Date: 02/27/2024  History of Present Illness  Pt admitted for acute CHF. PMH includes heart failure, Afib, CKD, COPD, DM, HTN and pacemaker. Multiple admissions to hospital for similar.  Clinical Impression  Pt pleasant and willing to work with PT, though very focused on her coffee/breakfast once it arrived.  She showed good general mobility and needed very little actually assist with mobility tasks.  She was able to do some in-home distance ambulation but does endorse fatigue at ~40 ft and needed to sit (this seems to be congruent with baseline distance/time activity though she does endorse feeling generally weaker and more limited than baseline.  Pt will benefit from continued PT to address functional limitations.        If plan is discharge home, recommend the following: Assistance with cooking/housework;Assist for transportation;A little help with bathing/dressing/bathroom   Can travel by private vehicle        Equipment Recommendations None recommended by PT  Recommendations for Other Services       Functional Status Assessment Patient has had a recent decline in their functional status and demonstrates the ability to make significant improvements in function in a reasonable and predictable amount of time.     Precautions / Restrictions Precautions Precautions: Fall Restrictions Weight Bearing Restrictions Per Provider Order: No      Mobility  Bed Mobility Overal bed mobility: Needs Assistance Bed Mobility: Supine to Sit     Supine to sit: Contact guard, Min assist     General bed mobility comments: Good effort, did not need physical assist apart from HHA to pull up from    Transfers Overall transfer level: Modified independent Equipment used: Rolling walker (2 wheels)               General transfer comment: Pt close for PRN assist, but pt able to rise from  standard height bed w/o physical assist    Ambulation/Gait Ambulation/Gait assistance: Contact guard assist Gait Distance (Feet): 45 Feet Assistive device: Rolling walker (2 wheels)         General Gait Details: Pt was able to slowly but safely ambulate into the hallway (chair follow).  After ~40 ft pt states I never walker more than this and indicates fatigue (vitals stable) and requests to sit.  No LOBs or overt safety issues apart from limited activity tolerance  Stairs            Wheelchair Mobility     Tilt Bed    Modified Rankin (Stroke Patients Only)       Balance Overall balance assessment: Needs assistance Sitting-balance support: No upper extremity supported Sitting balance-[name]: Good     Standing balance support: Bilateral upper extremity supported Standing balance-[name]: Good Standing balance comment: reliant on walker (baseline) w/o unsteadiness, etc                             Pertinent Vitals/Pain Pain Assessment Pain Assessment: No/denies pain    Home Living Family/patient expects to be discharged to:: Private residence Living Arrangements: Alone Available Help at Discharge: Family;Available PRN/intermittently (reports daughter arrived last night and plans so stay with her 24/7 for a while) Type of Home: Apartment Home Access: Level entry       Home Layout: One level Home Equipment: Rollator (4 wheels);Cane - single point;Grab bars - tub/shower;Grab bars - toilet;Lift chair;Transport chair;Tub bench;Rolling  Walker (2 wheels);Shower seat;Other (comment) Additional Comments: Senior living  apartment with granddaughter staying with patient as needed (works), has call bells in multiple rooms for help if needed. lift chair, leg lifter.    Prior Function Prior Level of Function : Needs assist             Mobility Comments: mod I with rollator in the home, used w/c outside of the home ADLs Comments: assist IADLs      Extremity/Trunk Assessment   Upper Extremity Assessment Upper Extremity Assessment: Generalized weakness;Overall [employer] for tasks assessed    Lower Extremity Assessment Lower Extremity Assessment: Generalized weakness;Overall WFL for tasks assessed       Communication   Communication Communication: No apparent difficulties    Cognition Arousal: Alert Behavior During Therapy: WFL for tasks assessed/performed   PT - Cognitive impairments: No apparent impairments                         Following commands: Intact       Cueing Cueing Techniques: Verbal cues     General Comments      Exercises     Assessment/Plan    PT Assessment Patient needs continued PT services  PT Problem List Decreased strength;Decreased range of motion;Decreased activity tolerance;Decreased balance;Decreased mobility;Decreased knowledge of precautions;Decreased safety awareness       PT Treatment Interventions DME instruction;Gait training;Stair training;Functional mobility training;Therapeutic exercise;Therapeutic activities;Neuromuscular re-education;Balance training;Patient/family education    PT Goals (Current goals can be found in the Care Plan section)  Acute Rehab PT Goals Patient Stated Goal: go home PT Goal Formulation: With patient Time For Goal Achievement: 03/11/24 Potential to Achieve Goals: Good    Frequency Min 2X/week     Co-evaluation               AM-PAC PT 6 Clicks Mobility  Outcome Measure Help needed turning from your back to your side while in a flat bed without using bedrails?: None Help needed moving from lying on your back to sitting on the side of a flat bed without using bedrails?: None Help needed moving to and from a bed to a chair (including a wheelchair)?: A Little Help needed standing up from a chair using your arms (e.g., wheelchair or bedside chair)?: A Little Help needed to walk in hospital room?: A Little Help needed climbing 3-5  steps with a railing? : A Little 6 Click Score: 20    End of Session Equipment Utilized During Treatment: Gait belt Activity Tolerance: Patient tolerated treatment well;Patient limited by fatigue Patient left: with chair alarm set;with call bell/phone within reach Nurse Communication: Mobility status PT Visit Diagnosis: Muscle weakness (generalized) (M62.81);Difficulty in walking, not elsewhere classified (R26.2)    Time: 9154-9088 PT Time Calculation (min) (ACUTE ONLY): 26 min   Charges:   PT Evaluation $PT Eval Low Complexity: 1 Low PT Treatments $Gait Training: 8-22 mins PT General Charges $$ ACUTE PT VISIT: 1 Visit         Carmin JONELLE Deed, DPT 02/27/2024, 10:00 AM

## 2024-02-27 NOTE — TOC Initial Note (Signed)
 Transition of Care [facility]) - Initial/Assessment Note    Patient Details  Name: Leslie Parker MRN: [id number] Date of Birth: 28-Sep-1932  [facility] CM/SW Contact:    [name], LCSW Phone Number: 02/27/2024, 3:07 PM  Clinical Narrative:   [facility] confirmed they can add PT and OT to patient's services.               Expected Discharge Plan: Home w Home Health Services Barriers to Discharge: Continued Medical Work up   Patient Goals and CMS Choice            Expected Discharge Plan and Services       Living arrangements for the past 2 months: Apartment                           HH Arranged: RN, PT, OT, Nurse's Aide HH Agency: [facility] Date [facility] Agency Contacted: 02/27/24   Representative spoke with at [facility] Agency: Larraine  Prior Living Arrangements/Services Living arrangements for the past 2 months: Apartment Lives with:: Self Patient language and need for interpreter reviewed:: Yes        Need for Family Participation in Patient Care: Yes (Comment)   Current home services: Homehealth aide, Home RN Criminal Activity/Legal Involvement Pertinent to Current Situation/Hospitalization: No - Comment as needed  Activities of Daily Living   ADL Screening (condition at time of admission) Independently performs ADLs?: Yes (appropriate for developmental age) Is the patient deaf or have difficulty hearing?: No Does the patient have difficulty seeing, even when wearing glasses/contacts?: No Does the patient have difficulty concentrating, remembering, or making decisions?: No  Permission Sought/Granted                  Emotional Assessment       Orientation: : Oriented to Self, Oriented to Place, Oriented to  Time, Oriented to Situation Alcohol / Substance Use: Not Applicable Psych Involvement: No (comment)  Admission diagnosis:  Atypical chest pain [R07.89] CHF exacerbation (HCC) [I50.9] Chronic congestive heart failure,  unspecified heart failure type (HCC) [I50.9] Patient Active Problem List   Diagnosis Date Noted   Protein-calorie malnutrition, severe 01/22/2024   Acute on chronic systolic CHF (congestive heart failure) (HCC) 12/14/2023   Scapular fracture 11/26/2023   Acute hypoxic respiratory failure (HCC) 11/15/2023   Do not resuscitate 11/10/2023   CHF exacerbation (HCC) 11/10/2023   Myocardial injury 11/09/2023   AKI (acute kidney injury) (HCC) 11/05/2023   Insomnia 11/05/2023   Frequent hospital admissions 11/05/2023   Acute exacerbation of CHF (congestive heart failure) (HCC) 10/22/2023   Elevated troponin 10/22/2023   Malnutrition of moderate degree 09/17/2023   CHF (congestive heart failure) (HCC) 09/15/2023   Acute on chronic HFrEF (heart failure with reduced ejection fraction) (HCC) 08/19/2023   Pleural effusion due to CHF (congestive heart failure) (HCC) 08/19/2023   HTN (hypertension), malignant 08/19/2023   Ileus (HCC) 08/12/2023   Large bowel obstruction (HCC) 08/12/2023   CKD (chronic kidney disease) stage 4, GFR 15-29 ml/min (HCC) 08/12/2023   HFrEF (heart failure with reduced ejection fraction) (HCC) 08/12/2023   Acute on chronic combined systolic and diastolic CHF (congestive heart failure) (HCC) 08/10/2023   Hyponatremia 05/05/2023   Overweight (BMI 25.0-29.9) 05/01/2023   Left hip pain 05/01/2023   Hypokalemia 04/30/2023   Anemia 03/21/2023   Abnormal LFTs 03/20/2023   Type II diabetes mellitus with renal manifestations (HCC) 02/13/2023   Chronic  diastolic CHF (congestive heart failure) (HCC) 02/13/2023   CKD stage 3 due to type 2 diabetes mellitus (HCC) 11/24/2022   Paroxysmal atrial fibrillation (HCC) 11/24/2022   Sick sinus syndrome (HCC) 11/15/2022   Acute on chronic diastolic CHF (congestive heart failure) (HCC) 08/27/2022   COPD (chronic obstructive pulmonary disease) (HCC) 08/23/2022   Chronic a-fib (HCC) 08/22/2022   Diabetes mellitus without complication (HCC)     Stage 3b chronic kidney disease (HCC) - Baseline scr 1.8-2.0    Postural dizziness with presyncope    Hypomagnesemia    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 [place] [address] Phone: [phone] Fax: [phone]  [id number], [place] - [address] [place] [address] Phone: [phone] Fax: [phone]  [facility] [address] [place] [address] Phone: [phone] Fax: [phone]     Social Drivers of Health (SDOH) Social History: SDOH Screenings   Food Insecurity: No Food Insecurity (02/25/2024)  Housing: Low Risk  (02/25/2024)  Transportation Needs: No Transportation Needs (02/25/2024)  Utilities: Not At Risk (02/25/2024)  Alcohol Screen: Low Risk  (08/20/2023)  Financial Resource Strain: Low Risk  (01/13/2024)   Received from [facility]  Social Connections: Moderately Isolated (02/25/2024)  Tobacco Use: Low Risk  (02/25/2024)   SDOH Interventions:     Readmission Risk Interventions    10/22/2023    2:28 PM 09/16/2023    2:52 PM 08/20/2023    3:45 PM  Readmission Risk Prevention Plan  Transportation Screening Complete Complete Complete  Medication Review Oceanographer) Complete Complete Complete  PCP or Specialist appointment within 3-5 days of discharge  Complete Complete  HRI or Home Care Consult Not Complete Patient refused Complete  HRI or Home Care Consult Pt Refusal Comments No PT recs at this time.    SW Recovery Care/Counseling Consult Complete Complete Complete  Palliative Care Screening Not Applicable Not Applicable Not Applicable  Skilled Nursing Facility Not Applicable Not Applicable Not Applicable

## 2024-02-27 NOTE — Evaluation (Signed)
 Occupational Therapy Evaluation Patient Details Name: Leslie Parker MRN: [id number] DOB: [birth year] Today's Date: 02/27/2024   History of Present Illness   Pt admitted for acute CHF. PMH includes heart failure, Afib, CKD, COPD, DM, HTN and pacemaker. Multiple admissions to hospital for similar.     Clinical Impressions Pt was seen for OT evaluation this date. PTA, pt resides in a senior living apartment alone and is typically MOD I with ADLs and ambulation within the home using a rollator. She uses a W/C for community distances. Her granddaughter is available as needed.  Pt presents to acute OT demonstrating impaired ADL performance and functional mobility 2/2 weakness and low activity tolerance. Pt currently requires supervision with increased time and cues for bed rail use to reach EOB. CGA/SBA for STS and for LB dressing to don mesh underwear. Pt able to ambulate ~27ft within the room with heavy BUE reliance on RW d/t BLE soreness. Pt feels safe to return home on DC, will benefit from continued skilled OT services to prevent further decline and maximize strength. Do anticipate the need for follow up OT services upon acute hospital DC.      If plan is discharge home, recommend the following:   A little help with walking and/or transfers;A little help with bathing/dressing/bathroom;Assist for transportation;Help with stairs or ramp for entrance     Functional Status Assessment   Patient has had a recent decline in their functional status and demonstrates the ability to make significant improvements in function in a reasonable and predictable amount of time.     Equipment Recommendations   None recommended by OT     Recommendations for Other Services         Precautions/Restrictions   Precautions Precautions: Fall Recall of Precautions/Restrictions: Intact Restrictions Weight Bearing Restrictions Per Provider Order: No     Mobility Bed Mobility Overal bed mobility:  Needs Assistance Bed Mobility: Supine to Sit     Supine to sit: Supervision     General bed mobility comments: cues for use of bed rail to reach EOB without assist    Transfers Overall transfer level: Needs assistance Equipment used: Rolling walker (2 wheels) Transfers: Sit to/from Stand Sit to Stand: Supervision, Contact guard assist           General transfer comment: CGA/SBA for STS from EOB to RW for safety, able to ambulate ~10 ft using RW with SBA and increased time d/t BLE soreness      Balance Overall balance assessment: Needs assistance Sitting-balance support: No upper extremity supported Sitting balance-[name]: Good     Standing balance support: Bilateral upper extremity supported, Reliant on assistive device for balance Standing balance-[name]: Good Standing balance comment: heavy BUE support on RW during ambulation d/t BLE soreness                           ADL either performed or assessed with clinical judgement   ADL Overall ADL's : Needs assistance/impaired                     Lower Body Dressing: Sit to/from stand;Sitting/lateral leans;Contact guard assist               Functional mobility during ADLs: Contact guard assist;Rolling walker (2 wheels)       Vision         Perception         Praxis  Pertinent Vitals/Pain Pain Assessment Pain Assessment: Faces Faces Pain Scale: Hurts little more Pain Location: BLEs Pain Descriptors / Indicators: Sore Pain Intervention(s): Monitored during session, Limited activity within patient's tolerance, Repositioned     Extremity/Trunk Assessment Upper Extremity Assessment Upper Extremity Assessment: Generalized weakness   Lower Extremity Assessment Lower Extremity Assessment: Generalized weakness       Communication Communication Communication: No apparent difficulties   Cognition Arousal: Alert Behavior During Therapy: WFL for tasks  assessed/performed                                 Following commands: Intact       Cueing  General Comments   Cueing Techniques: Verbal cues      Exercises Other Exercises Other Exercises: Edu on role of OT in acute setting, DME use, AE/AD for safe and IND ADL performance.   Shoulder Instructions      Home Living Family/patient expects to be discharged to:: Private residence Living Arrangements: Alone Available Help at Discharge: Family;Available PRN/intermittently (reports daughter arrived last night and plans so stay with her 24/7 for a while) Type of Home: Apartment Home Access: Level entry     Home Layout: One level     Bathroom Shower/Tub: Producer, television/film/video: Handicapped height Bathroom Accessibility: Yes   Home Equipment: Rollator (4 wheels);Cane - single point;Grab bars - tub/shower;Grab bars - toilet;Lift chair;Transport chair;Tub bench;Rolling Walker (2 wheels);Shower seat;Other (comment)   Additional Comments: Senior living  apartment with granddaughter staying with patient as needed (works), has call bells in multiple rooms for help if needed. lift chair, leg lifter.      Prior Functioning/Environment Prior Level of Function : Needs assist             Mobility Comments: mod I with rollator in the home, used w/c outside of the home ADLs Comments: assist IADLs    OT Problem List: Decreased strength;Decreased activity tolerance;Impaired balance (sitting and/or standing)   OT Treatment/Interventions: Self-care/ADL training;Therapeutic exercise;Patient/family education;Energy conservation;Therapeutic activities;DME and/or AE instruction      OT Goals(Current goals can be found in the care plan section)   Acute Rehab OT Goals Patient Stated Goal: return home OT Goal Formulation: With patient Time For Goal Achievement: 03/12/24 Potential to Achieve Goals: Good ADL Goals Pt Will Perform Lower Body Bathing: with modified  independence;with set-up;sit to/from stand;sitting/lateral leans Pt Will Perform Lower Body Dressing: with modified independence;sit to/from stand;sitting/lateral leans Pt Will Transfer to Toilet: with modified independence;ambulating;regular height toilet   OT Frequency:  Min 2X/week    Co-evaluation              AM-PAC OT 6 Clicks Daily Activity     Outcome Measure Help from another person eating meals?: None Help from another person taking care of personal grooming?: None Help from another person toileting, which includes using toliet, bedpan, or urinal?: A Little Help from another person bathing (including washing, rinsing, drying)?: A Little Help from another person to put on and taking off regular upper body clothing?: None Help from another person to put on and taking off regular lower body clothing?: A Little 6 Click Score: 21   End of Session Equipment Utilized During Treatment: Rolling walker (2 wheels) Nurse Communication: Mobility status  Activity Tolerance: Patient tolerated treatment well Patient left: in bed;with call bell/phone within reach;with bed alarm set  OT Visit Diagnosis: Other abnormalities of gait and mobility (R26.89);Muscle  weakness (generalized) (M62.81)                Time: 8947-8890 OT Time Calculation (min): 17 min Charges:  OT General Charges $OT Visit: 1 Visit OT Evaluation $OT Eval Low Complexity: 1 Low  Vidal Lampkins, OTR/L  02/27/24, 12:23 PM  Tammy Ericsson E Cheron Pasquarelli 02/27/2024, 12:20 PM

## 2024-02-27 NOTE — Progress Notes (Signed)
 PROGRESS NOTE    [name]  FMW:[id number] DOB: 07-Oct-1932 DOA: 02/25/2024 PCP: Sherial Bail, MD  Chief Complaint  Patient presents with   Chest Pain    Hospital Course:  Leslie Parker is an 89 year old female with heart failure reduced EF, A-fib, CKD stage IV, COPD, diabetes, hypertension, bradycardia status post pacemaker who presents to the ED complaining of nonradiating substernal chest pain that started while she was in bed last night.  Patient was recently discharged 7/3 and during that admission was treated for heart failure exacerbation.  In the ED she was hemodynamically stable on 2 L nasal cannula, BNP chronically elevated above 4500, troponin slightly elevated but flat which appears consistent with baseline.  CXR revealed bilateral vascular congestion and effusions.  She was admitted for diuresis  Subjective: No acute events overnight.  Patient is requesting discharge home.  We discussed her declining kidney function.  We discussed her overall prognosis and ongoing difficulties tolerating medications.  We discussed the complexities of declining kidney function without cure.  She endorses understanding.  She is not interested in hospice and would like to stop having this offered to her.  Objective: Vitals:   02/27/24 1536 02/27/24 1600 02/27/24 1601 02/27/24 1610  BP: (!) 145/74     Pulse: (!) 59     Resp: 20 19 (!) 21 12  Temp: 97.6 F (36.4 C)     TempSrc:      SpO2: 98%     Weight:      Height:        Intake/Output Summary (Last 24 hours) at 02/27/2024 1621 Last data filed at 02/27/2024 1300 Gross per 24 hour  Intake 0 ml  Output 350 ml  Net -350 ml   Filed Weights   02/25/24 0122  Weight: 72 kg    Examination: General exam: Appears calm and comfortable, NAD  Respiratory system: No work of breathing, symmetric chest wall expansion. CTAB Cardiovascular system: S1 & S2 heard, RRR.  Gastrointestinal system: Abdomen is nondistended, soft and nontender.   Neuro: Alert and oriented. No focal neurological deficits. Extremities: Symmetric, expected ROM, no lower extremity edema Skin: No rashes, lesions Psychiatry: Demonstrates appropriate judgement and insight. Mood & affect appropriate for situation.   Assessment & Plan:  Principal Problem:   Acute on chronic HFrEF (heart failure with reduced ejection fraction) (HCC) Active Problems:   Acute hypoxic respiratory failure (HCC)   Chronic a-fib (HCC)   CHF exacerbation (HCC)   Acute hypoxic respiratory failure Acute on chronic combined systolic and diastolic CHF Chest pain - Echo 01/21/2024: EF 25 to 30%, mild LVH, grade 2 diastolic dysfunction, normal RV, mild MR. - Heart score 5, troponins elevated and flat in setting of CKD and heart failure exacerbation - Requiring 2 L nasal cannula - Continue with Imdur  and Coreg  - Clinically appears dry today.  IV Lasix  was discontinued on 8/6. - Continue monitoring on telemetry.  Continue monitoring I's and O's  Acute hypoxic respiratory failure COPD - Required 2 L on arrival likely secondary to CHF exacerbation.  No wheezing on exam. - Continue Breztri . - Now stable on room air  Chronic A-fib Sick sinus syndrome - Currently rate controlled - Status post pacemaker - Continue home meds amiodarone  and Eliquis   AKI superimposed on CKD stage IV - Baseline creatinine: 2.2 - Creatinine on arrival: 3.2, now rising to 3.5. - BUN also rising.  Patient may be dry, did have effusions on admission but is unlikely to tolerate any further  diuresis.  Currently stable on room air - Lasix  has been discontinued - This will be an ongoing problem.  Complicated by cardiorenal syndrome. - Nephrology has been consulted, though I doubt she is a candidate for dialysis given her more functional status and advanced age.  Appreciate recommendations.    Poor prognosis - Patient has had over 17 visits to the hospital in the last 12 months.  She has severe heart  failure and kidney function is no longer tolerating IV diuresis - She is a poor candidate for hemodialysis.  She is progressively weaker with each visit. - I have discussed hospice with this patient and she is refusing to consider any other option.  She would like to continue to come to the hospital for ongoing aggressive medical care.  Palliative care was also consulted and patient reiterated same goals.  Palliative care has signed off.   DVT prophylaxis: Eliquis    Code Status: Full Code Disposition:  Currently lives alone. Patient has previously been offered SNF and denied.  Patient's family continued to have significant concerns about her functional status and ability to live independently.  She reports the family has previously tried to encourage the patient to consider a facility or at least live-in help though the patient has been resistant.  At minimum the patient will require home health at discharge  Consultants:    Procedures:    Antimicrobials:  Anti-infectives (From admission, onward)    None       Data Reviewed: I have personally reviewed following labs and imaging studies CBC: Recent Labs  Lab 02/25/24 0117 02/27/24 0325  WBC 4.0 3.7*  NEUTROABS 2.2 2.3  HGB 11.4* 9.9*  HCT 36.2 30.6*  MCV 97.8 96.8  PLT 184 156   Basic Metabolic Panel: Recent Labs  Lab 02/25/24 0117 02/26/24 0338 02/27/24 0325  NA 139 138 136  K 4.4 4.1 4.9  CL 104 103 102  CO2 24 25 24   GLUCOSE 120* 129* 153*  BUN 87* 81* 85*  CREATININE 3.22* 3.30* 3.54*  CALCIUM 8.6* 8.2* 8.1*  MG  --  1.9 1.6*  PHOS  --   --  4.8*   GFR: Estimated Creatinine Clearance: 11.3 mL/min (A) (by C-G formula based on SCr of 3.54 mg/dL (H)). Liver Function Tests: Recent Labs  Lab 02/25/24 0117 02/26/24 0338 02/27/24 0325  AST 33 24 20  ALT 26 19 18   ALKPHOS 76 64 59  BILITOT 0.7 0.8 0.8  PROT 6.9 5.5* 5.4*  ALBUMIN  3.4* 2.8* 2.8*   CBG: No results for input(s): GLUCAP in the last 168  hours.  No results found for this or any previous visit (from the past 240 hours).   Radiology Studies: No results found.   Scheduled Meds:  acetaminophen   1,000 mg Oral TID   amiodarone   200 mg Oral Daily   apixaban   2.5 mg Oral BID   budesonide -glycopyrrolate -formoterol   2 puff Inhalation BID   carvedilol   6.25 mg Oral BID WC   isosorbide  mononitrate  30 mg Oral Daily   potassium chloride  SA  40 mEq Oral Daily   Continuous Infusions:   LOS: 0 days  MDM: Patient is high risk for one or more organ failure.  They necessitate ongoing hospitalization for continued IV therapies and subsequent lab monitoring. Total time spent interpreting labs and vitals, reviewing the medical record, coordinating care amongst consultants and care team members, directly assessing and discussing care with the patient and/or family: 55 min  Marnesha Gagen, DO  [facility]  To contact the attending physician between 7A-7P please use Epic Chat. To contact the covering physician during after hours 7P-7A, please review Amion.  02/27/2024, 4:21 PM   *This document has been created with the assistance of dictation software. Please excuse typographical errors. *

## 2024-02-28 ENCOUNTER — Observation Stay

## 2024-02-28 DIAGNOSIS — N281 Cyst of kidney, acquired: Secondary | ICD-10-CM | POA: Diagnosis not present

## 2024-02-28 DIAGNOSIS — Z7189 Other specified counseling: Secondary | ICD-10-CM

## 2024-02-28 DIAGNOSIS — I509 Heart failure, unspecified: Secondary | ICD-10-CM | POA: Diagnosis not present

## 2024-02-28 DIAGNOSIS — I482 Chronic atrial fibrillation, unspecified: Secondary | ICD-10-CM | POA: Diagnosis not present

## 2024-02-28 DIAGNOSIS — N179 Acute kidney failure, unspecified: Secondary | ICD-10-CM | POA: Diagnosis not present

## 2024-02-28 DIAGNOSIS — I5022 Chronic systolic (congestive) heart failure: Secondary | ICD-10-CM | POA: Diagnosis not present

## 2024-02-28 DIAGNOSIS — N184 Chronic kidney disease, stage 4 (severe): Secondary | ICD-10-CM | POA: Diagnosis not present

## 2024-02-28 DIAGNOSIS — D631 Anemia in chronic kidney disease: Secondary | ICD-10-CM | POA: Diagnosis not present

## 2024-02-28 DIAGNOSIS — I5023 Acute on chronic systolic (congestive) heart failure: Secondary | ICD-10-CM | POA: Diagnosis not present

## 2024-02-28 DIAGNOSIS — J9601 Acute respiratory failure with hypoxia: Secondary | ICD-10-CM | POA: Diagnosis not present

## 2024-02-28 DIAGNOSIS — R109 Unspecified abdominal pain: Secondary | ICD-10-CM | POA: Diagnosis not present

## 2024-02-28 LAB — CBC WITH DIFFERENTIAL/PLATELET
Abs Immature Granulocytes: 0.01 K/uL (ref 0.00–0.07)
Basophils Absolute: 0 K/uL (ref 0.0–0.1)
Basophils Relative: 0 %
Eosinophils Absolute: 0 K/uL (ref 0.0–0.5)
Eosinophils Relative: 1 %
HCT: 31.9 % — ABNORMAL LOW (ref 36.0–46.0)
Hemoglobin: 10.1 g/dL — ABNORMAL LOW (ref 12.0–15.0)
Immature Granulocytes: 0 %
Lymphocytes Relative: 27 %
Lymphs Abs: 0.9 K/uL (ref 0.7–4.0)
MCH: 30.5 pg (ref 26.0–34.0)
MCHC: 31.7 g/dL (ref 30.0–36.0)
MCV: 96.4 fL (ref 80.0–100.0)
Monocytes Absolute: 0.3 K/uL (ref 0.1–1.0)
Monocytes Relative: 8 %
Neutro Abs: 2.3 K/uL (ref 1.7–7.7)
Neutrophils Relative %: 64 %
Platelets: 156 K/uL (ref 150–400)
RBC: 3.31 MIL/uL — ABNORMAL LOW (ref 3.87–5.11)
RDW: 15.6 % — ABNORMAL HIGH (ref 11.5–15.5)
WBC: 3.5 K/uL — ABNORMAL LOW (ref 4.0–10.5)
nRBC: 0 % (ref 0.0–0.2)

## 2024-02-28 LAB — OCCULT BLOOD X 1 CARD TO LAB, STOOL: Fecal Occult Bld: POSITIVE — AB

## 2024-02-28 LAB — COMPREHENSIVE METABOLIC PANEL WITH GFR
ALT: 16 U/L (ref 0–44)
AST: 20 U/L (ref 15–41)
Albumin: 2.9 g/dL — ABNORMAL LOW (ref 3.5–5.0)
Alkaline Phosphatase: 69 U/L (ref 38–126)
Anion gap: 11 (ref 5–15)
BUN: 84 mg/dL — ABNORMAL HIGH (ref 8–23)
CO2: 24 mmol/L (ref 22–32)
Calcium: 8.4 mg/dL — ABNORMAL LOW (ref 8.9–10.3)
Chloride: 101 mmol/L (ref 98–111)
Creatinine, Ser: 3.05 mg/dL — ABNORMAL HIGH (ref 0.44–1.00)
GFR, Estimated: 14 mL/min — ABNORMAL LOW (ref >60)
Glucose, Bld: 118 mg/dL — ABNORMAL HIGH (ref 70–99)
Potassium: 4.4 mmol/L (ref 3.5–5.1)
Sodium: 136 mmol/L (ref 135–145)
Total Bilirubin: 0.7 mg/dL (ref 0.0–1.2)
Total Protein: 5.8 g/dL — ABNORMAL LOW (ref 6.5–8.1)

## 2024-02-28 LAB — PHOSPHORUS: Phosphorus: 4 mg/dL (ref 2.5–4.6)

## 2024-02-28 LAB — HEMOGLOBIN: Hemoglobin: 11.1 g/dL — ABNORMAL LOW (ref 12.0–15.0)

## 2024-02-28 LAB — MAGNESIUM: Magnesium: 1.7 mg/dL (ref 1.7–2.4)

## 2024-02-28 MED ORDER — IPRATROPIUM-ALBUTEROL 0.5-2.5 (3) MG/3ML IN SOLN
3.0000 mL | RESPIRATORY_TRACT | Status: DC | PRN
  Administered 2024-02-28: 3 mL via RESPIRATORY_TRACT
  Filled 2024-02-28: qty 3

## 2024-02-28 MED ORDER — PANTOPRAZOLE SODIUM 40 MG IV SOLR
40.0000 mg | Freq: Two times a day (BID) | INTRAVENOUS | Status: DC
  Administered 2024-02-28 – 2024-02-29 (×3): 40 mg via INTRAVENOUS
  Filled 2024-02-28 (×3): qty 10

## 2024-02-28 NOTE — Plan of Care (Signed)
  Problem: Education: Goal: Knowledge of General Education information will improve Description: Including pain rating scale, medication(s)/side effects and non-pharmacologic comfort measures Outcome: Progressing   Problem: Clinical Measurements: Goal: Ability to maintain clinical measurements within normal limits will improve Outcome: Progressing   Problem: Activity: Goal: Risk for activity intolerance will decrease Outcome: Progressing   Problem: Nutrition: Goal: Adequate nutrition will be maintained Outcome: Progressing   Problem: Elimination: Goal: Will not experience complications related to urinary retention Outcome: Progressing   Problem: Pain Managment: Goal: General experience of comfort will improve and/or be controlled Outcome: Progressing   Problem: Safety: Goal: Ability to remain free from injury will improve Outcome: Progressing   Problem: Skin Integrity: Goal: Risk for impaired skin integrity will decrease Outcome: Progressing

## 2024-02-28 NOTE — Progress Notes (Signed)
 PROGRESS NOTE    Leslie Parker  FMW:[id number] DOB: 1933/01/24 DOA: 02/25/2024 PCP: Sherial Bail, MD  Chief Complaint  Patient presents with   Chest Pain    Hospital Course:  Leslie Parker is an 89 year old female with heart failure reduced EF, A-fib, CKD stage IV, COPD, diabetes, hypertension, bradycardia status post pacemaker who presents to the ED complaining of nonradiating substernal chest pain that started while she was in bed last night.  Patient was recently discharged 7/3 and during that admission was treated for heart failure exacerbation.  In the ED she was hemodynamically stable on 2 L nasal cannula, BNP chronically elevated above 4500, troponin slightly elevated but flat which appears consistent with baseline.  CXR revealed bilateral vascular congestion and effusions.  She was admitted for diuresis  Subjective: No acute events overnight. On evaluation today patient endorses feeling better.   Objective: Vitals:   02/28/24 0407 02/28/24 0806 02/28/24 1156 02/28/24 1532  BP: 130/81 (!) 142/88 (!) 147/80 (!) 144/81  Pulse: 63 (!) 59 (!) 59 63  Resp: 16 16 18 16   Temp: (!) 97.3 F (36.3 C) 97.6 F (36.4 C) (!) 97.4 F (36.3 C) (!) 97.5 F (36.4 C)  TempSrc:      SpO2: 97% 96% 99% 98%  Weight:      Height:        Intake/Output Summary (Last 24 hours) at 02/28/2024 1552 Last data filed at 02/28/2024 1500 Gross per 24 hour  Intake 720 ml  Output 550 ml  Net 170 ml   Filed Weights   02/25/24 0122  Weight: 72 kg    Examination: General exam: Appears calm and comfortable, NAD  Respiratory system: No work of breathing, symmetric chest wall expansion. CTAB Cardiovascular system: S1 & S2 heard, RRR.  Gastrointestinal system: Abdomen is nondistended, soft and nontender.  Neuro: Alert and oriented. No focal neurological deficits. Extremities: Symmetric, expected ROM, no lower extremity edema Skin: No rashes, lesions Psychiatry: Demonstrates appropriate judgement and  insight. Mood & affect appropriate for situation.   Assessment & Plan:  Principal Problem:   Acute on chronic HFrEF (heart failure with reduced ejection fraction) (HCC) Active Problems:   Acute hypoxic respiratory failure (HCC)   Chronic a-fib (HCC)   CHF exacerbation (HCC)   Acute hypoxic respiratory failure Acute on chronic combined systolic and diastolic CHF Chest pain - Echo 01/21/2024: EF 25 to 30%, mild LVH, grade 2 diastolic dysfunction, normal RV, mild MR. - Heart score 5, troponins elevated and flat in setting of CKD and heart failure exacerbation - Requiring 2 L nasal cannula - Clinically appears dry today.  IV Lasix  was discontinued on 8/6. - Continue monitoring on telemetry.  Continue monitoring I's and O's.   -- Continue with Imdur  and Coreg , monitor as needed  Acute hypoxic respiratory failure COPD - Required 2 L on arrival likely secondary to CHF exacerbation.  No wheezing on exam. - Continue Breztri . - Now stable on room air  Chronic A-fib Sick sinus syndrome - Currently rate controlled - Status post pacemaker - Continue home meds amiodarone  and Eliquis   AKI superimposed on CKD stage IV - Baseline creatinine: 2.2 - Creatinine on arrival: 3.2, peaked at 3.5, downtrending now - Likely acute worsening due to cardiorenal syndrome, volume overload, and IV diuretics. - Nephrology was consulted.  Patient on Candidate for dialysis given advanced age and comorbidities. - Will continue monitoring creatinine if it continues to downtrend can consider outpatient follow-up with nephrology and discharge home.  Poor  prognosis - Patient has had over 17 visits to the hospital in the last 12 months.  She has severe heart failure and kidney function is no longer tolerating IV diuresis - She is a poor candidate for hemodialysis.  She is progressively weaker with each visit. - I have discussed hospice with this patient and she is refusing to consider any other option.  She would  like to continue to come to the hospital for ongoing aggressive medical care.  Palliative care was also consulted and patient reiterated same goals.  Palliative care has signed off.   DVT prophylaxis: Eliquis    Code Status: Full Code Disposition:  Currently lives alone. Patient has previously been offered SNF and denied.  Patient's family continued to have significant concerns about her functional status and ability to live independently.  She reports the family has previously tried to encourage the patient to consider a facility or at least live-in help though the patient has been resistant.  At minimum the patient will require home health at discharge. HH PT/OT/RN/HHA ordered.   Consultants:  Treatment Team:  Consulting Physician: Dennise Capri, MD  Procedures:    Antimicrobials:  Anti-infectives (From admission, onward)    None       Data Reviewed: I have personally reviewed following labs and imaging studies CBC: Recent Labs  Lab 02/25/24 0117 02/27/24 0325 02/28/24 0502  WBC 4.0 3.7* 3.5*  NEUTROABS 2.2 2.3 2.3  HGB 11.4* 9.9* 10.1*  HCT 36.2 30.6* 31.9*  MCV 97.8 96.8 96.4  PLT 184 156 156   Basic Metabolic Panel: Recent Labs  Lab 02/25/24 0117 02/26/24 0338 02/27/24 0325 02/28/24 0502  NA 139 138 136 136  K 4.4 4.1 4.9 4.4  CL 104 103 102 101  CO2 24 25 24 24   GLUCOSE 120* 129* 153* 118*  BUN 87* 81* 85* 84*  CREATININE 3.22* 3.30* 3.54* 3.05*  CALCIUM 8.6* 8.2* 8.1* 8.4*  MG  --  1.9 1.6* 1.7  PHOS  --   --  4.8* 4.0   GFR: Estimated Creatinine Clearance: 13.1 mL/min (A) (by C-G formula based on SCr of 3.05 mg/dL (H)). Liver Function Tests: Recent Labs  Lab 02/25/24 0117 02/26/24 0338 02/27/24 0325 02/28/24 0502  AST 33 24 20 20   ALT 26 19 18 16   ALKPHOS 76 64 59 69  BILITOT 0.7 0.8 0.8 0.7  PROT 6.9 5.5* 5.4* 5.8*  ALBUMIN  3.4* 2.8* 2.8* 2.9*   CBG: No results for input(s): GLUCAP in the last 168 hours.  No results found for this  or any previous visit (from the past 240 hours).   Radiology Studies: No results found.   Scheduled Meds:  acetaminophen   1,000 mg Oral TID   amiodarone   200 mg Oral Daily   apixaban   2.5 mg Oral BID   budesonide -glycopyrrolate -formoterol   2 puff Inhalation BID   carvedilol   6.25 mg Oral BID WC   isosorbide  mononitrate  30 mg Oral Daily   potassium chloride  SA  40 mEq Oral Daily   Continuous Infusions:   LOS: 0 days  MDM: Patient is high risk for one or more organ failure.  They necessitate ongoing hospitalization for continued IV therapies and subsequent lab monitoring. Total time spent interpreting labs and vitals, reviewing the medical record, coordinating care amongst consultants and care team members, directly assessing and discussing care with the patient and/or family: 55 min  Leslie Windle, DO [facility]  To contact the attending physician between 7A-7P please use Epic Chat.  To contact the covering physician during after hours 7P-7A, please review Amion.  02/28/2024, 3:52 PM   *This document has been created with the assistance of dictation software. Please excuse typographical errors. *

## 2024-02-28 NOTE — Significant Event (Addendum)
       CROSS COVER NOTE  NAME: Leslie Parker MRN: [id number] DOB : Dec 08, 1932 ATTENDING PHYSICIAN: Dezii, Alexandra, DO    Date of Service   02/28/2024   HPI/Events of Note   Message received from [place] patient here with heart failure but since change of shift has been miserable with her stomach has had per NA a large mushy bowel movement at change of shift and nausea vomiting have medicated with mylanta and zofran . But she's still complaining that she has to go and moaning out in pain in her stomach. she wants an enema and I dont think it would hurt hopefully will make her feel better to get it out. thanks  Patient with history of CKD IV, HFrEF (Echo 01/21/2024 EF 25-30), COPD, A fib (eliquis ), HTN, bradycardia s/p pacemaker Admitted 02/25/2024 for CHF exacerbation, acute on chronic hypoxic resp failure andAKI on CKD.  Per [facility] admissions over last year related to fuid overload and worsening renal function.  Diuresis stopped 2 days ago Patient denies history of constipation diarrhea, PUD or GI bleeding  Interventions   Assessment/Plan:    02/28/2024    7:37 PM 02/28/2024    3:32 PM 02/28/2024   11:56 AM  Vitals with BMI  Systolic 139 144 852  Diastolic 73 81 80  Pulse 61 63 59   TEMP                    97.7  Patient in overt distress with pain in abdomen, moaning and holding abdomen. Noted dark green black, mushy, large stool upon arrival to rom (second stool since change of shift. Color, consistency of first unknown Abdomen soft, no focal tenderness., rectum, no external hemorrhoid visualized, digital internal exam no fecal impaction Stool hemoccult - positive Protonix  BID  Will need GI consult in am      Erminio LITTIE Cone NP [facility] Cross Cover 7pm-7am - check amion for availability Pager [phone]

## 2024-02-28 NOTE — Consult Note (Signed)
 [facility]  CONSULT NOTE    Date: 02/28/2024                  Patient Name:  Leslie Parker  MRN: [id number]  DOB: 06/09/1933  Age / Sex: 89 y.o., female         PCP: Sherial Bail, MD                 Service Requesting Consult: TRH                 Reason for Consult: Acute kidney injury on CKD4            History of Present Illness: Ms. Leslie Parker is a 89 y.o.  female with past medical conditions including COPD, diabetes, hypertension, bradycardia with pacemaker, atrial fibrillation, heart failure, and chronic kidney disease stage 4, who was admitted to [facility] on 02/25/2024 for Atypical chest pain [R07.89] CHF exacerbation (HCC) [I50.9] Chronic congestive heart failure, unspecified heart failure type [facility]) [I50.9]  Patient presents to the emergency room with chest pain.  Patient is seen sitting up in bed, alert and oriented.  States she presented to the emergency room due to her heart failure.  Along with chest pain, reports some shortness of breath.  Denies known fever or chills.  Appears to have had a recent admission where she was treated for heart failure exacerbation.  Labs on ED arrival concerning for BUN 87, creatinine 3.22 with GFR 13, BNP greater than 4500, troponin 38, and hemoglobin 11.4.  Chest x-ray shows mild cardiomegaly with calcific aortic arthrosclerosis and pulmonary vascular congestion.  Also noted small bilateral pleural effusions with basilar atelectasis.  Creatinine appears to have peaked at 3.54 yesterday but has shown improvement today.  Patient initially treated with IV furosemide .   Medications: Outpatient medications: Medications Prior to Admission  Medication Sig Dispense Refill Last Dose/Taking   acetaminophen  (TYLENOL ) 500 MG tablet Take 2 tablets (1,000 mg total) by mouth 3 (three) times daily.   02/24/2024   albuterol  (VENTOLIN  HFA) 108 (90 Base) MCG/ACT inhaler Inhale 2 puffs into the lungs every 6 (six) hours as needed.    Taking As Needed   alum & mag hydroxide-simeth (MAALOX/MYLANTA) 200-200-20 MG/5ML suspension Take 30 mLs by mouth every 6 (six) hours as needed for indigestion or heartburn.   Taking As Needed   amiodarone  (PACERONE ) 200 MG tablet Take 1 tablet by mouth daily.   Past Month   apixaban  (ELIQUIS ) 2.5 MG TABS tablet Take 1 tablet (2.5 mg total) by mouth 2 (two) times daily. 60 tablet 1 02/24/2024 Evening   ascorbic acid  (VITAMIN C ) 250 MG tablet Take 250 mg by mouth daily.   02/24/2024   carvedilol  (COREG ) 6.25 MG tablet Take 1 tablet (6.25 mg total) by mouth 2 (two) times daily with a meal. 60 tablet 0 02/24/2024   Cholecalciferol  (VITAMIN D -1000 MAX ST) 25 MCG (1000 UT) tablet Take 1,000 Units by mouth daily.   02/24/2024   Cyanocobalamin  (B-12) 2500 MCG TABS Take 2,500 mcg by mouth daily.   02/24/2024   losartan  (COZAAR ) 25 MG tablet Take 0.5 tablets (12.5 mg total) by mouth daily. 30 tablet 0 02/24/2024   metoprolol  tartrate (LOPRESSOR ) 25 MG tablet Take 25 mg by mouth 2 (two) times daily.   Taking   Multiple Vitamin (MULTIVITAMIN WITH MINERALS) TABS tablet Take 1 tablet by mouth daily. One-A-Day Women's Vitamin   02/24/2024   ondansetron  (ZOFRAN ) 4 MG tablet Take 1 tablet (  4 mg total) by mouth every 6 (six) hours as needed for nausea. 20 tablet 0 Taking As Needed   polyethylene glycol (MIRALAX  / GLYCOLAX ) 17 g packet Take 17 g by mouth daily as needed.   Taking As Needed   potassium chloride  SA (KLOR-CON  M) 20 MEQ tablet Take 2 tablets (40 mEq total) by mouth daily. 60 tablet 5 02/24/2024   senna-docusate (SENOKOT-S) 8.6-50 MG tablet Take 2 tablets by mouth at bedtime as needed for mild constipation.   Taking As Needed   torsemide  (DEMADEX ) 20 MG tablet Take 2 tablets (40 mg total) by mouth in the morning AND 1 tablet (20 mg total) daily in the afternoon. 90 tablet 11 02/24/2024   colestipol  (COLESTID ) 1 g tablet Take 2 g by mouth.  Take 2 g by mouth 2 (two) times daily (Patient not taking: Reported on 02/25/2024)    Not Taking   hydrALAZINE  (APRESOLINE ) 25 MG tablet 25 mg 3 (three) times daily.      isosorbide  mononitrate (IMDUR ) 30 MG 24 hr tablet Take 30 mg by mouth.  Take 30 mg by mouth at bedtime (Patient not taking: Reported on 02/25/2024)   Not Taking   JARDIANCE  10 MG TABS tablet  (Patient not taking: Reported on 02/25/2024)   Not Taking   TRELEGY ELLIPTA 100-62.5-25 MCG/ACT AEPB Inhale 1 puff into the lungs daily. (Patient not taking: Reported on 02/25/2024)   Not Taking    Current medications: Current Facility-Administered Medications  Medication Dose Route Frequency Provider Last Rate Last Admin   acetaminophen  (TYLENOL ) tablet 1,000 mg  1,000 mg Oral TID Krugh, Marissa C, DO   1,000 mg at 02/28/24 0823   alum & mag hydroxide-simeth (MAALOX/MYLANTA) 200-200-20 MG/5ML suspension 30 mL  30 mL Oral Q6H PRN Krugh, Marissa C, DO       amiodarone  (PACERONE ) tablet 200 mg  200 mg Oral Daily Krugh, Marissa C, DO   200 mg at 02/28/24 0825   apixaban  (ELIQUIS ) tablet 2.5 mg  2.5 mg Oral BID Krugh, Marissa C, DO   2.5 mg at 02/28/24 9176   budesonide -glycopyrrolate -formoterol  (BREZTRI ) 160-9-4.8 MCG/ACT inhaler 2 puff  2 puff Inhalation BID Krugh, Marissa C, DO   2 puff at 02/28/24 9173   carvedilol  (COREG ) tablet 6.25 mg  6.25 mg Oral BID WC Krugh, Marissa C, DO   6.25 mg at 02/28/24 9176   isosorbide  mononitrate (IMDUR ) 24 hr tablet 30 mg  30 mg Oral Daily Krugh, Marissa C, DO   30 mg at 02/28/24 0825   ondansetron  (ZOFRAN ) tablet 4 mg  4 mg Oral Q6H PRN Krugh, Marissa C, DO       polyethylene glycol (MIRALAX  / GLYCOLAX ) packet 17 g  17 g Oral Daily PRN Krugh, Marissa C, DO       potassium chloride  SA (KLOR-CON  M) CR tablet 40 mEq  40 mEq Oral Daily Krugh, Marissa C, DO   40 mEq at 02/28/24 0825      Allergies: Allergies  Allergen Reactions   Baclofen Other (See Comments)    Elevated Cr   Simvastatin Other (See Comments)    Pt denies  Elevated LFTs with simva 40mg , stopped and resolved (see MD note  11/10/13)      Past Medical History: Past Medical History:  Diagnosis Date   Acute kidney injury superimposed on CKD (HCC) 11/24/2022   Asthma    Atrial fibrillation with RVR (HCC)    Cancer (HCC)    Basal Cell   CHF (congestive  heart failure) (HCC)    Closed left hip fracture (HCC) 01/25/2017   Closed right hip fracture (HCC) 02/13/2023   Diabetes mellitus without complication (HCC)    Femur fracture, left (HCC) 02/03/2015   Heart murmur    Hypertension    Impetigo    Osteoarthritis    Osteopenia    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   Rapid atrial fibrillation, new onset(HCC) 06/05/2022   Vomiting    can not due to surgery   Wears dentures    full upper and lower     Past Surgical History: Past Surgical History:  Procedure Laterality Date   ABDOMINAL HYSTERECTOMY     BACK SURGERY     BLADDER SURGERY     mesh   CARPAL TUNNEL RELEASE Bilateral    CATARACT EXTRACTION Right 2017   CATARACT EXTRACTION W/PHACO Left 02/06/2016   Procedure: CATARACT EXTRACTION PHACO AND INTRAOCULAR LENS PLACEMENT (IOC) left eye;  Surgeon: Donzell Arlyce Budd, MD;  Location: [facility];  Service: Ophthalmology;  Laterality: Left;  DIABETIC LEFT Cannot arrive before 9:30   CERVICAL DISC SURGERY     CHOLECYSTECTOMY     EYE SURGERY Bilateral    Cataract Extraction with IOL   FEMUR IM NAIL Left 02/04/2015   Procedure: INTRAMEDULLARY (IM) NAIL FEMORAL;  Surgeon: Franky Cranker, MD;  Location: [facility];  Service: Orthopedics;  Laterality: Left;   HARDWARE REMOVAL Left 01/17/2017   Procedure: HARDWARE REMOVAL;  Surgeon: Cranker Franky, MD;  Location: [facility];  Service: Orthopedics;  Laterality: Left;   HERNIA REPAIR  2014   esophageal and gastric mesh. patient unable to throw up d/t mesh   HIP ARTHROPLASTY Left 01/26/2017   Procedure: ARTHROPLASTY BIPOLAR HIP (HEMIARTHROPLASTY) removal hardware left hip;  Surgeon: Cleotilde Barrio, MD;  Location: [facility];  Service: Orthopedics;   Laterality: Left;   INTRAMEDULLARY (IM) NAIL INTERTROCHANTERIC Right 02/13/2023   Procedure: INTRAMEDULLARY (IM) NAIL INTERTROCHANTERIC;  Surgeon: Edie Norleen PARAS, MD;  Location: [facility];  Service: Orthopedics;  Laterality: Right;   JOINT REPLACEMENT Bilateral    knees   PACEMAKER IMPLANT N/A 11/15/2022   Procedure: PACEMAKER IMPLANT;  Surgeon: Ammon Blunt, MD;  Location: [facility];  Service: Cardiovascular;  Laterality: N/A;   PACEMAKER IMPLANT N/A 11/28/2022   Procedure: PACEMAKER IMPLANT;  Surgeon: Ammon Blunt, MD;  Location: [facility];  Service: Cardiovascular;  Laterality: N/A;  Lead reposition   REPLACEMENT TOTAL KNEE BILATERAL Bilateral [id number]   SHOULDER ARTHROSCOPY WITH OPEN ROTATOR CUFF REPAIR AND DISTAL CLAVICLE ACROMINECTOMY Left 10/25/2016   Procedure: SHOULDER ARTHROSCOPY WITH OPEN ROTATOR CUFF REPAIR AND DISTAL CLAVICLE ACROMINECTOMY;  Surgeon: Franky Cranker, MD;  Location: [facility];  Service: Orthopedics;  Laterality: Left;   THYROID  SURGERY     goiter removed   TOTAL HIP REVISION Left 12/02/2017   Procedure: TOTAL HIP REVISION;  Surgeon: Leora Lynwood SAUNDERS, MD;  Location: [facility];  Service: Orthopedics;  Laterality: Left;   TOTAL SHOULDER REPLACEMENT Right 2012     Family History: Family History  Problem Relation Age of Onset   Heart failure Mother    Hypertension Mother    Emphysema Father      Social History: Social History   Socioeconomic History   Marital status: Divorced    Spouse name: Not on file   Number of children: 4   Years of education: Not on file   Highest education level: 12th grade  Occupational History   Occupation: Retitred/Disability  Tobacco Use  Smoking status: Never   Smokeless tobacco: Never  Vaping Use   Vaping status: Never Used  Substance and Sexual Activity   Alcohol use: No    Alcohol/week: 0.0 standard drinks of alcohol   Drug use: No   Sexual activity: Not Currently  Other Topics  Concern   Not on file  Social History Narrative   Not on file   Social Drivers of Health   Financial Resource Strain: Low Risk  (01/13/2024)   Received from [facility]   Overall Financial Resource Strain (CARDIA)    Difficulty of Paying Living Expenses: Not hard at all  Food Insecurity: No Food Insecurity (02/25/2024)   Hunger Vital Sign    Worried About Running Out of Food in the Last Year: Never true    Ran Out of Food in the Last Year: Never true  Transportation Needs: No Transportation Needs (02/25/2024)   PRAPARE - Administrator, Civil Service (Medical): No    Lack of Transportation (Non-Medical): No  Physical Activity: Not on file  Stress: Not on file  Social Connections: Moderately Isolated (02/25/2024)   Social Connection and Isolation Panel    Frequency of Communication with Friends and Family: More than three times a week    Frequency of Social Gatherings with Friends and Family: Three times a week    Attends Religious Services: 1 to 4 times per year    Active Member of Clubs or Organizations: No    Attends Banker Meetings: Never    Marital Status: Divorced  Catering manager Violence: Not At Risk (02/25/2024)   Humiliation, Afraid, Rape, and Kick questionnaire    Fear of Current or Ex-Partner: No    Emotionally Abused: No    Physically Abused: No    Sexually Abused: No     Review of Systems: Review of Systems  Constitutional:  Negative for chills, fever and malaise/fatigue.  HENT:  Negative for congestion, sore throat and tinnitus.   Eyes:  Negative for blurred vision and redness.  Respiratory:  Positive for shortness of breath. Negative for cough and wheezing.   Cardiovascular:  Positive for chest pain. Negative for palpitations, claudication and leg swelling.  Gastrointestinal:  Negative for abdominal pain, blood in stool, diarrhea, nausea and vomiting.  Genitourinary:  Negative for flank pain, frequency and hematuria.   Musculoskeletal:  Negative for back pain, falls and myalgias.  Skin:  Negative for rash.  Neurological:  Negative for dizziness, weakness and headaches.  Endo/Heme/Allergies:  Does not bruise/bleed easily.  Psychiatric/Behavioral:  Negative for depression. The patient is not nervous/anxious and does not have insomnia.     Vital Signs: Blood pressure (!) 147/80, pulse (!) 59, temperature (!) 97.4 F (36.3 C), resp. rate 18, height 5' 9 (1.753 m), weight 72 kg, SpO2 99%.  Weight trends: Filed Weights   02/25/24 0122  Weight: 72 kg    Physical Exam: General: NAD  Head: Normocephalic, atraumatic. Moist oral mucosal membranes  Eyes: Anicteric  Neck: Supple  Lungs:  Clear to auscultation, normal effort  Heart: Regular rate and rhythm  Abdomen:  Soft, nontender  Extremities:  No peripheral edema.  Neurologic: Nonfocal, moving all four extremities  Skin: No lesions        Lab results: Basic Metabolic Panel: Recent Labs  Lab 02/26/24 0338 02/27/24 0325 02/28/24 0502  NA 138 136 136  K 4.1 4.9 4.4  CL 103 102 101  CO2 25 24 24   GLUCOSE 129* 153* 118*  BUN 81* 85* 84*  CREATININE 3.30* 3.54* 3.05*  CALCIUM 8.2* 8.1* 8.4*  MG 1.9 1.6* 1.7  PHOS  --  4.8* 4.0    Liver Function Tests: Recent Labs  Lab 02/26/24 0338 02/27/24 0325 02/28/24 0502  AST 24 20 20   ALT 19 18 16   ALKPHOS 64 59 69  BILITOT 0.8 0.8 0.7  PROT 5.5* 5.4* 5.8*  ALBUMIN  2.8* 2.8* 2.9*   No results for input(s): LIPASE, AMYLASE in the last 168 hours. No results for input(s): AMMONIA in the last 168 hours.  CBC: Recent Labs  Lab 02/25/24 0117 02/27/24 0325 02/28/24 0502  WBC 4.0 3.7* 3.5*  NEUTROABS 2.2 2.3 2.3  HGB 11.4* 9.9* 10.1*  HCT 36.2 30.6* 31.9*  MCV 97.8 96.8 96.4  PLT 184 156 156    Cardiac Enzymes: No results for input(s): CKTOTAL, CKMB, CKMBINDEX, TROPONINI in the last 168 hours.  BNP: Invalid input(s): POCBNP  CBG: No results for input(s):  GLUCAP in the last 168 hours.  Microbiology: Results for orders placed or performed during the hospital encounter of 12/14/23  Resp panel by RT-PCR (RSV, Flu A&B, Covid) Anterior Nasal Swab     Status: None   Collection Time: 12/14/23  8:27 AM   Specimen: Anterior Nasal Swab  Result Value Ref Range Status   SARS Coronavirus 2 by RT PCR NEGATIVE NEGATIVE Final    Comment: (NOTE) SARS-CoV-2 target nucleic acids are NOT DETECTED.  The SARS-CoV-2 RNA is generally detectable in upper respiratory specimens during the acute phase of infection. The lowest concentration of SARS-CoV-2 viral copies this assay can detect is 138 copies/mL. A negative result does not preclude SARS-Cov-2 infection and should not be used as the sole basis for treatment or other patient management decisions. A negative result may occur with  improper specimen collection/handling, submission of specimen other than nasopharyngeal swab, presence of viral mutation(s) within the areas targeted by this assay, and inadequate number of viral copies(<138 copies/mL). A negative result must be combined with clinical observations, patient history, and epidemiological information. The expected result is Negative.  Fact Sheet for Patients:  BloggerCourse.com  Fact Sheet for Healthcare Providers:  SeriousBroker.it  This test is no t yet approved or cleared by the [place]  FDA and  has been authorized for detection and/or diagnosis of SARS-CoV-2 by FDA under an Emergency Use Authorization (EUA). This EUA will remain  in effect (meaning this test can be used) for the duration of the COVID-19 declaration under Section 564(b)(1) of the Act, 21 U.S.C.section 360bbb-3(b)(1), unless the authorization is terminated  or revoked sooner.       Influenza A by PCR NEGATIVE NEGATIVE Final   Influenza B by PCR NEGATIVE NEGATIVE Final    Comment: (NOTE) The Xpert Xpress  SARS-CoV-2/FLU/RSV plus assay is intended as an aid in the diagnosis of influenza from Nasopharyngeal swab specimens and should not be used as a sole basis for treatment. Nasal washings and aspirates are unacceptable for Xpert Xpress SARS-CoV-2/FLU/RSV testing.  Fact Sheet for Patients: BloggerCourse.com  Fact Sheet for Healthcare Providers: SeriousBroker.it  This test is not yet approved or cleared by the [place]  FDA and has been authorized for detection and/or diagnosis of SARS-CoV-2 by FDA under an Emergency Use Authorization (EUA). This EUA will remain in effect (meaning this test can be used) for the duration of the COVID-19 declaration under Section 564(b)(1) of the Act, 21 U.S.C. section 360bbb-3(b)(1), unless the authorization is terminated or revoked.     Resp  Syncytial Virus by PCR NEGATIVE NEGATIVE Final    Comment: (NOTE) Fact Sheet for Patients: BloggerCourse.com  Fact Sheet for Healthcare Providers: SeriousBroker.it  This test is not yet approved or cleared by the [place]  FDA and has been authorized for detection and/or diagnosis of SARS-CoV-2 by FDA under an Emergency Use Authorization (EUA). This EUA will remain in effect (meaning this test can be used) for the duration of the COVID-19 declaration under Section 564(b)(1) of the Act, 21 U.S.C. section 360bbb-3(b)(1), unless the authorization is terminated or revoked.  Performed at [facility], [address] [place], [place] [address]     Coagulation Studies: No results for input(s): LABPROT, INR in the last 72 hours.  Urinalysis: No results for input(s): COLORURINE, LABSPEC, PHURINE, GLUCOSEU, HGBUR, BILIRUBINUR, KETONESUR, PROTEINUR, UROBILINOGEN, NITRITE, LEUKOCYTESUR in the last 72 hours.  Invalid input(s): APPERANCEUR    Imaging: No results  found.   Assessment & Plan: Ms. Leslie Parker is a 89 y.o.  female with past medical conditions including COPD, diabetes, hypertension, bradycardia with pacemaker, atrial fibrillation, heart failure, and chronic kidney disease stage 4, who was admitted to [facility] on 02/25/2024 for Atypical chest pain [R07.89] CHF exacerbation (HCC) [I50.9] Chronic congestive heart failure, unspecified heart failure type (HCC) [I50.9]   Acute kidney injury on chronic kidney disease stage IV. Baseline creatinine 2.6 with GFR 17 on 01/29/24. Does not follow with outpatient nephrology. No IV contrast exposure. Acute kidney injury likely secondary to cardiorenal syndrome with volume overload. Patient was treated with IV furosemide , but now held. Creatinine peaked at 3.54 but has improved today to 3.08. Will continue to monitor and provide supportive care.   2. Anemia of chronic kidney disease Lab Results  Component Value Date   HGB 10.1 (L) 02/28/2024   Hgb within desired range. No need for ESA's at this time.   3. 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   LOS: 0 Brytani Voth 8/8/202512:55 PM

## 2024-02-28 NOTE — Plan of Care (Signed)

## 2024-02-29 DIAGNOSIS — I5023 Acute on chronic systolic (congestive) heart failure: Secondary | ICD-10-CM | POA: Diagnosis not present

## 2024-02-29 DIAGNOSIS — N184 Chronic kidney disease, stage 4 (severe): Secondary | ICD-10-CM | POA: Diagnosis not present

## 2024-02-29 DIAGNOSIS — N179 Acute kidney failure, unspecified: Secondary | ICD-10-CM | POA: Diagnosis not present

## 2024-02-29 DIAGNOSIS — J9601 Acute respiratory failure with hypoxia: Secondary | ICD-10-CM | POA: Diagnosis not present

## 2024-02-29 DIAGNOSIS — Z7189 Other specified counseling: Secondary | ICD-10-CM | POA: Diagnosis not present

## 2024-02-29 DIAGNOSIS — I482 Chronic atrial fibrillation, unspecified: Secondary | ICD-10-CM | POA: Diagnosis not present

## 2024-02-29 DIAGNOSIS — I5042 Chronic combined systolic (congestive) and diastolic (congestive) heart failure: Secondary | ICD-10-CM | POA: Diagnosis not present

## 2024-02-29 DIAGNOSIS — I509 Heart failure, unspecified: Secondary | ICD-10-CM | POA: Diagnosis not present

## 2024-02-29 LAB — CBC WITH DIFFERENTIAL/PLATELET
Abs Immature Granulocytes: 0.02 K/uL (ref 0.00–0.07)
Basophils Absolute: 0 K/uL (ref 0.0–0.1)
Basophils Relative: 0 %
Eosinophils Absolute: 0 K/uL (ref 0.0–0.5)
Eosinophils Relative: 0 %
HCT: 32.3 % — ABNORMAL LOW (ref 36.0–46.0)
Hemoglobin: 10.2 g/dL — ABNORMAL LOW (ref 12.0–15.0)
Immature Granulocytes: 0 %
Lymphocytes Relative: 11 %
Lymphs Abs: 0.6 K/uL — ABNORMAL LOW (ref 0.7–4.0)
MCH: 30.2 pg (ref 26.0–34.0)
MCHC: 31.6 g/dL (ref 30.0–36.0)
MCV: 95.6 fL (ref 80.0–100.0)
Monocytes Absolute: 0.4 K/uL (ref 0.1–1.0)
Monocytes Relative: 7 %
Neutro Abs: 4.6 K/uL (ref 1.7–7.7)
Neutrophils Relative %: 82 %
Platelets: 162 K/uL (ref 150–400)
RBC: 3.38 MIL/uL — ABNORMAL LOW (ref 3.87–5.11)
RDW: 15.7 % — ABNORMAL HIGH (ref 11.5–15.5)
WBC: 5.7 K/uL (ref 4.0–10.5)
nRBC: 0 % (ref 0.0–0.2)

## 2024-02-29 LAB — COMPREHENSIVE METABOLIC PANEL WITH GFR
ALT: 15 U/L (ref 0–44)
AST: 20 U/L (ref 15–41)
Albumin: 2.7 g/dL — ABNORMAL LOW (ref 3.5–5.0)
Alkaline Phosphatase: 65 U/L (ref 38–126)
Anion gap: 8 (ref 5–15)
BUN: 80 mg/dL — ABNORMAL HIGH (ref 8–23)
CO2: 25 mmol/L (ref 22–32)
Calcium: 8.3 mg/dL — ABNORMAL LOW (ref 8.9–10.3)
Chloride: 102 mmol/L (ref 98–111)
Creatinine, Ser: 2.98 mg/dL — ABNORMAL HIGH (ref 0.44–1.00)
GFR, Estimated: 15 mL/min — ABNORMAL LOW (ref >60)
Glucose, Bld: 121 mg/dL — ABNORMAL HIGH (ref 70–99)
Potassium: 4.8 mmol/L (ref 3.5–5.1)
Sodium: 135 mmol/L (ref 135–145)
Total Bilirubin: 0.9 mg/dL (ref 0.0–1.2)
Total Protein: 5.4 g/dL — ABNORMAL LOW (ref 6.5–8.1)

## 2024-02-29 LAB — MAGNESIUM: Magnesium: 1.7 mg/dL (ref 1.7–2.4)

## 2024-02-29 LAB — PHOSPHORUS: Phosphorus: 4 mg/dL (ref 2.5–4.6)

## 2024-02-29 MED ORDER — NAPHAZOLINE-GLYCERIN 0.012-0.25 % OP SOLN
1.0000 [drp] | Freq: Four times a day (QID) | OPHTHALMIC | Status: DC | PRN
  Filled 2024-02-29: qty 15

## 2024-02-29 MED ORDER — NAPHAZOLINE-PHENIRAMINE 0.025-0.3 % OP SOLN
1.0000 [drp] | Freq: Four times a day (QID) | OPHTHALMIC | Status: DC | PRN
  Administered 2024-02-29: 2 [drp] via OPHTHALMIC
  Administered 2024-03-01 (×2): 1 [drp] via OPHTHALMIC
  Filled 2024-02-29: qty 5

## 2024-02-29 NOTE — Progress Notes (Signed)
 [facility]  ROUNDING NOTE   Subjective:   Patient seen laying in bed Untouched breakfast tray at bedside Denies pain No lower extremity edema Room air  Objective:  Vital signs in last 24 hours:  Temp:  [97.4 F (36.3 C)-97.7 F (36.5 C)] 97.7 F (36.5 C) (08/09 0830) Pulse Rate:  [59-64] 63 (08/09 0830) Resp:  [16-21] 18 (08/09 0830) BP: (112-147)/(56-81) 112/56 (08/09 0830) SpO2:  [92 %-99 %] 94 % (08/09 0830) Weight:  [69.8 kg] 69.8 kg (08/09 0500)  Weight change:  Filed Weights   02/25/24 0122 02/29/24 0500  Weight: 72 kg 69.8 kg    Intake/Output: I/O last 3 completed shifts: In: 480 [P.O.:480] Out: 550 [Urine:550]   Intake/Output this shift:  Total I/O In: 120 [P.O.:120] Out: -   Physical Exam: General: NAD  Head: Normocephalic, atraumatic. Moist oral mucosal membranes  Eyes: Anicteric  Lungs:  Clear to auscultation, normal effort  Heart: Regular rate and rhythm  Abdomen:  Soft, nontender  Extremities:  No peripheral edema.  Neurologic: Awake, alert, conversant  Skin: Warm,dry, no rash       Basic Metabolic Panel: Recent Labs  Lab 02/25/24 0117 02/26/24 0338 02/27/24 0325 02/28/24 0502 02/29/24 0526  NA 139 138 136 136 135  K 4.4 4.1 4.9 4.4 4.8  CL 104 103 102 101 102  CO2 24 25 24 24 25   GLUCOSE 120* 129* 153* 118* 121*  BUN 87* 81* 85* 84* 80*  CREATININE 3.22* 3.30* 3.54* 3.05* 2.98*  CALCIUM 8.6* 8.2* 8.1* 8.4* 8.3*  MG  --  1.9 1.6* 1.7 1.7  PHOS  --   --  4.8* 4.0 4.0    Liver Function Tests: Recent Labs  Lab 02/25/24 0117 02/26/24 0338 02/27/24 0325 02/28/24 0502 02/29/24 0526  AST 33 24 20 20 20   ALT 26 19 18 16 15   ALKPHOS 76 64 59 69 65  BILITOT 0.7 0.8 0.8 0.7 0.9  PROT 6.9 5.5* 5.4* 5.8* 5.4*  ALBUMIN  3.4* 2.8* 2.8* 2.9* 2.7*   No results for input(s): LIPASE, AMYLASE in the last 168 hours. No results for input(s): AMMONIA in the last 168 hours.  CBC: Recent Labs  Lab 02/25/24 0117  02/27/24 0325 02/28/24 0502 02/28/24 2211 02/29/24 0526  WBC 4.0 3.7* 3.5*  --  5.7  NEUTROABS 2.2 2.3 2.3  --  4.6  HGB 11.4* 9.9* 10.1* 11.1* 10.2*  HCT 36.2 30.6* 31.9*  --  32.3*  MCV 97.8 96.8 96.4  --  95.6  PLT 184 156 156  --  162    Cardiac Enzymes: No results for input(s): CKTOTAL, CKMB, CKMBINDEX, TROPONINI in the last 168 hours.  BNP: Invalid input(s): POCBNP  CBG: No results for input(s): GLUCAP in the last 168 hours.  Microbiology: Results for orders placed or performed during the hospital encounter of 12/14/23  Resp panel by RT-PCR (RSV, Flu A&B, Covid) Anterior Nasal Swab     Status: None   Collection Time: 12/14/23  8:27 AM   Specimen: Anterior Nasal Swab  Result Value Ref Range Status   SARS Coronavirus 2 by RT PCR NEGATIVE NEGATIVE Final    Comment: (NOTE) SARS-CoV-2 target nucleic acids are NOT DETECTED.  The SARS-CoV-2 RNA is generally detectable in upper respiratory specimens during the acute phase of infection. The lowest concentration of SARS-CoV-2 viral copies this assay can detect is 138 copies/mL. A negative result does not preclude SARS-Cov-2 infection and should not be used as the sole basis for treatment  or other patient management decisions. A negative result may occur with  improper specimen collection/handling, submission of specimen other than nasopharyngeal swab, presence of viral mutation(s) within the areas targeted by this assay, and inadequate number of viral copies(<138 copies/mL). A negative result must be combined with clinical observations, patient history, and epidemiological information. The expected result is Negative.  Fact Sheet for Patients:  BloggerCourse.com  Fact Sheet for Healthcare Providers:  SeriousBroker.it  This test is no t yet approved or cleared by the [place]  FDA and  has been authorized for detection and/or diagnosis of SARS-CoV-2  by FDA under an Emergency Use Authorization (EUA). This EUA will remain  in effect (meaning this test can be used) for the duration of the COVID-19 declaration under Section 564(b)(1) of the Act, 21 U.S.C.section 360bbb-3(b)(1), unless the authorization is terminated  or revoked sooner.       Influenza A by PCR NEGATIVE NEGATIVE Final   Influenza B by PCR NEGATIVE NEGATIVE Final    Comment: (NOTE) The Xpert Xpress SARS-CoV-2/FLU/RSV plus assay is intended as an aid in the diagnosis of influenza from Nasopharyngeal swab specimens and should not be used as a sole basis for treatment. Nasal washings and aspirates are unacceptable for Xpert Xpress SARS-CoV-2/FLU/RSV testing.  Fact Sheet for Patients: BloggerCourse.com  Fact Sheet for Healthcare Providers: SeriousBroker.it  This test is not yet approved or cleared by the [place]  FDA and has been authorized for detection and/or diagnosis of SARS-CoV-2 by FDA under an Emergency Use Authorization (EUA). This EUA will remain in effect (meaning this test can be used) for the duration of the COVID-19 declaration under Section 564(b)(1) of the Act, 21 U.S.C. section 360bbb-3(b)(1), unless the authorization is terminated or revoked.     Resp Syncytial Virus by PCR NEGATIVE NEGATIVE Final    Comment: (NOTE) Fact Sheet for Patients: BloggerCourse.com  Fact Sheet for Healthcare Providers: SeriousBroker.it  This test is not yet approved or cleared by the [place]  FDA and has been authorized for detection and/or diagnosis of SARS-CoV-2 by FDA under an Emergency Use Authorization (EUA). This EUA will remain in effect (meaning this test can be used) for the duration of the COVID-19 declaration under Section 564(b)(1) of the Act, 21 U.S.C. section 360bbb-3(b)(1), unless the authorization is terminated or revoked.  Performed at  [facility], [address] [place], [place] [address]     Coagulation Studies: No results for input(s): LABPROT, INR in the last 72 hours.  Urinalysis: No results for input(s): COLORURINE, LABSPEC, PHURINE, GLUCOSEU, HGBUR, BILIRUBINUR, KETONESUR, PROTEINUR, UROBILINOGEN, NITRITE, LEUKOCYTESUR in the last 72 hours.  Invalid input(s): APPERANCEUR    Imaging: DG Abd 1 View Result Date: 02/28/2024 CLINICAL DATA:  Abdominal pain EXAM: ABDOMEN - 1 VIEW COMPARISON:  11/27/2023 FINDINGS: Scattered large and small bowel gas is noted. Bilateral hip surgery is noted. Degenerative changes of the lumbar spine are seen with stable scoliosis concave to the right. No free air seen. No acute bony abnormality is noted. IMPRESSION: No acute abnormality seen. Electronically Signed   By: Oneil Devonshire M.D.   On: 02/28/2024 21:56   US  RENAL Result Date: 02/28/2024 EXAM: RETROPERITONEAL ULTRASOUND OF THE KIDNEYS AND URINARY BLADDER TECHNIQUE: Real-time ultrasonography of the retroperitoneum including the kidneys and urinary bladder was performed. COMPARISON: 06/28/2024 CT CLINICAL HISTORY: Acute kidney injury [facility]) FINDINGS: RIGHT KIDNEY: The right kidney measures 11.8 x 7.6 x 8 cm, 378 cc. Multiple right renal cortical cysts measuring up to 8.1 cm. No hydronephrosis. LEFT  KIDNEY: The left kidney measures 12.6 x 6.3 x 6.6 cm, 271 cc. Exophytic left renal cortical cyst 5 cm. No hydronephrosis. BLADDER: Unremarkable appearance of the bladder. SPLEEN: 2 cm splenic cyst. IMPRESSION: 1. No hydronephrosis. 2. Multiple  renal   cysts. 3. 2 cm splenic cyst. Electronically signed by: Dayne Hassell MD 02/28/2024 04:12 PM EDT RP Workstation: [id number]     Medications:     acetaminophen   1,000 mg Oral TID   amiodarone   200 mg Oral Daily   budesonide -glycopyrrolate -formoterol   2 puff Inhalation BID   carvedilol   6.25 mg Oral BID WC   isosorbide  mononitrate  30 mg Oral Daily    pantoprazole  (PROTONIX ) IV  40 mg Intravenous Q12H   alum & mag hydroxide-simeth, ipratropium-albuterol , ondansetron , polyethylene glycol  Assessment/ Plan:  Ms. MARKEA RUZICH is a 89 y.o.  female with past medical conditions including COPD, diabetes, hypertension, bradycardia with pacemaker, atrial fibrillation, heart failure, and chronic kidney disease stage 4, who was admitted to [facility] on 02/25/2024 for Atypical chest pain [R07.89] CHF exacerbation (HCC) [I50.9] Chronic congestive heart failure, unspecified heart failure type (HCC) [I50.9]   Acute kidney injury on chronic kidney disease stage IV. Baseline creatinine 2.6 with GFR 17 on 01/29/24. Does not follow with outpatient nephrology. No IV contrast exposure. Acute kidney injury likely secondary to cardiorenal syndrome with volume overload. Patient was treated with IV furosemide , but now held. Creatinine peaked at 3.54. Creatinine continues to improve. No acute indication for dialysis. Will continue to monitor and follow at discharge.   Lab Results  Component Value Date   CREATININE 2.98 (H) 02/29/2024   CREATININE 3.05 (H) 02/28/2024   CREATININE 3.54 (H) 02/27/2024    Intake/Output Summary (Last 24 hours) at 02/29/2024 1057 Last data filed at 02/29/2024 0900 Gross per 24 hour  Intake 120 ml  Output 550 ml  Net -430 ml   2. Anemia of chronic kidney disease Lab Results  Component Value Date   HGB 10.2 (L) 02/29/2024    Hgb within optimal range.   3. 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     LOS: 0 Emmily Pellegrin 8/9/202510:57 AM

## 2024-02-29 NOTE — Plan of Care (Signed)

## 2024-02-29 NOTE — Plan of Care (Signed)

## 2024-02-29 NOTE — Progress Notes (Signed)
 PROGRESS NOTE    Leslie Parker  FMW:[id number] DOB: 1933-03-24 DOA: 02/25/2024 PCP: Sherial Bail, MD  Chief Complaint  Patient presents with   Chest Pain    Hospital Course:  Leslie Parker is an 89 year old female with heart failure reduced EF, A-fib, CKD stage IV, COPD, diabetes, hypertension, bradycardia status post pacemaker who presents to the ED complaining of nonradiating substernal chest pain that started while she was in bed last night.  Patient was recently discharged 7/3 and during that admission was treated for heart failure exacerbation.  In the ED she was hemodynamically stable on 2 L nasal cannula, BNP chronically elevated above 4500, troponin slightly elevated but flat which appears consistent with baseline.  CXR revealed bilateral vascular congestion and effusions.  She was admitted for diuresis  Subjective: Overnight patient had episode of abdominal pain followed by dark stool.  Stool Hemoccult was positive.  She was started on Protonix . This morning patient reports she is feeling much better.  Denies any abdominal pain or further diarrhea.  We discussed possible workup for this and she is not interested in pursuing colonoscopy at this time.  Objective: Vitals:   02/29/24 0404 02/29/24 0500 02/29/24 0830 02/29/24 1146  BP: (!) 112/58  (!) 112/56 135/63  Pulse: 64  63 60  Resp: 17  18 16   Temp: (!) 97.5 F (36.4 C)  97.7 F (36.5 C) (!) 97.4 F (36.3 C)  TempSrc: Oral  Oral Oral  SpO2: 92%  94% 95%  Weight:  69.8 kg    Height:        Intake/Output Summary (Last 24 hours) at 02/29/2024 1530 Last data filed at 02/29/2024 1300 Gross per 24 hour  Intake 120 ml  Output --  Net 120 ml   Filed Weights   02/25/24 0122 02/29/24 0500  Weight: 72 kg 69.8 kg    Examination: General exam: Appears calm and comfortable, NAD  Respiratory system: No work of breathing, symmetric chest wall expansion. CTAB Cardiovascular system: S1 & S2 heard, RRR.  Gastrointestinal  system: Abdomen is nondistended, soft and nontender.  Neuro: Alert and oriented. No focal neurological deficits. Extremities: Symmetric, expected ROM, no lower extremity edema Skin: No rashes, lesions Psychiatry: Demonstrates appropriate judgement and insight. Mood & affect appropriate for situation.   Assessment & Plan:  Principal Problem:   Acute on chronic HFrEF (heart failure with reduced ejection fraction) (HCC) Active Problems:   Acute hypoxic respiratory failure (HCC)   Chronic a-fib (HCC)   CHF exacerbation (HCC)   Acute hypoxic respiratory failure Acute on chronic combined systolic and diastolic CHF Chest pain - Echo 01/21/2024: EF 25 to 30%, mild LVH, grade 2 diastolic dysfunction, normal RV, mild MR. - Heart score 5, troponins elevated and flat in setting of CKD and heart failure exacerbation - On room air now - Clinically appears dry.  Lasix  discontinued on 8/6 - Continue monitoring on telemetry - Continue monitoring I's and O's -Continue with Imdur  and Coreg , monitor as needed  Abdominal pain - FOBT positive.  Episode of abdominal pain followed by dark green-black stool. - Currently abdominal pain is resolved. - Hemoglobin remains very stable - Discussed possible workup with the patient who is not interested in pursuing colonoscopy or GI consult at this time.  Will continue to monitor in house for recurrence.  Acute hypoxic respiratory failure COPD - Required 2 L on arrival likely secondary to CHF exacerbation.  No wheezing on exam. - Continue Breztri . - Now stable on room air  Chronic A-fib Sick sinus syndrome - Currently rate controlled - Status post pacemaker - Continue home meds amiodarone  and Eliquis   AKI superimposed on CKD stage IV - Baseline creatinine: 2.2 - Creatinine on arrival: 3.2, peaked at 3.5, downtrending now - Likely acute worsening due to cardiorenal syndrome, volume overload, and IV diuretics. - Nephrology was consulted.  Patient not a  candidate for dialysis given advanced age and comorbidities. - Will continue monitoring creatinine if it continues to downtrend can consider outpatient follow-up with nephrology and discharge home.  Poor prognosis - Patient has had over 17 visits to the hospital in the last 12 months.  She has severe heart failure and kidney function is no longer tolerating IV diuresis - She is a poor candidate for hemodialysis.  She is progressively weaker with each visit. - I have discussed hospice with this patient and she is refusing to consider any other option.  She would like to continue to come to the hospital for ongoing aggressive medical care.  Palliative care was also consulted and patient reiterated same goals.  Palliative care has signed off.   DVT prophylaxis: Eliquis    Code Status: Full Code Disposition:  Currently lives alone. Patient has previously been offered SNF and denied.  Patient's family continued to have significant concerns about her functional status and ability to live independently.  She reports the family has previously tried to encourage the patient to consider a facility or at least live-in help though the patient has been resistant.  At minimum the patient will require home health at discharge. HH PT/OT/RN/HHA ordered. Continue monitoring in house today given abdominal pain and bloody stool overnight.  If no further episodes will discharge home in a.m.  Consultants:  Treatment Team:  Consulting Physician: Dennise Capri, MD  Procedures:    Antimicrobials:  Anti-infectives (From admission, onward)    None       Data Reviewed: I have personally reviewed following labs and imaging studies CBC: Recent Labs  Lab 02/25/24 0117 02/27/24 0325 02/28/24 0502 02/28/24 2211 02/29/24 0526  WBC 4.0 3.7* 3.5*  --  5.7  NEUTROABS 2.2 2.3 2.3  --  4.6  HGB 11.4* 9.9* 10.1* 11.1* 10.2*  HCT 36.2 30.6* 31.9*  --  32.3*  MCV 97.8 96.8 96.4  --  95.6  PLT 184 156 156  --  162    Basic Metabolic Panel: Recent Labs  Lab 02/25/24 0117 02/26/24 0338 02/27/24 0325 02/28/24 0502 02/29/24 0526  NA 139 138 136 136 135  K 4.4 4.1 4.9 4.4 4.8  CL 104 103 102 101 102  CO2 24 25 24 24 25   GLUCOSE 120* 129* 153* 118* 121*  BUN 87* 81* 85* 84* 80*  CREATININE 3.22* 3.30* 3.54* 3.05* 2.98*  CALCIUM 8.6* 8.2* 8.1* 8.4* 8.3*  MG  --  1.9 1.6* 1.7 1.7  PHOS  --   --  4.8* 4.0 4.0   GFR: Estimated Creatinine Clearance: 13.4 mL/min (A) (by C-G formula based on SCr of 2.98 mg/dL (H)). Liver Function Tests: Recent Labs  Lab 02/25/24 0117 02/26/24 0338 02/27/24 0325 02/28/24 0502 02/29/24 0526  AST 33 24 20 20 20   ALT 26 19 18 16 15   ALKPHOS 76 64 59 69 65  BILITOT 0.7 0.8 0.8 0.7 0.9  PROT 6.9 5.5* 5.4* 5.8* 5.4*  ALBUMIN  3.4* 2.8* 2.8* 2.9* 2.7*   CBG: No results for input(s): GLUCAP in the last 168 hours.  No results found for this or any previous visit (  from the past 240 hours).   Radiology Studies: DG Abd 1 View Result Date: 02/28/2024 CLINICAL DATA:  Abdominal pain EXAM: ABDOMEN - 1 VIEW COMPARISON:  11/27/2023 FINDINGS: Scattered large and small bowel gas is noted. Bilateral hip surgery is noted. Degenerative changes of the lumbar spine are seen with stable scoliosis concave to the right. No free air seen. No acute bony abnormality is noted. IMPRESSION: No acute abnormality seen. Electronically Signed   By: Oneil Devonshire M.D.   On: 02/28/2024 21:56   US  RENAL Result Date: 02/28/2024 EXAM: RETROPERITONEAL ULTRASOUND OF THE KIDNEYS AND URINARY BLADDER TECHNIQUE: Real-time ultrasonography of the retroperitoneum including the kidneys and urinary bladder was performed. COMPARISON: 06/28/2024 CT CLINICAL HISTORY: Acute kidney injury [facility]) FINDINGS: RIGHT KIDNEY: The right kidney measures 11.8 x 7.6 x 8 cm, 378 cc. Multiple right renal cortical cysts measuring up to 8.1 cm. No hydronephrosis. LEFT KIDNEY: The left kidney measures 12.6 x 6.3 x 6.6 cm, 271 cc.  Exophytic left renal cortical cyst 5 cm. No hydronephrosis. BLADDER: Unremarkable appearance of the bladder. SPLEEN: 2 cm splenic cyst. IMPRESSION: 1. No hydronephrosis. 2. Multiple  renal   cysts. 3. 2 cm splenic cyst. Electronically signed by: Dayne Hassell MD 02/28/2024 04:12 PM EDT RP Workstation: [id number]     Scheduled Meds:  acetaminophen   1,000 mg Oral TID   amiodarone   200 mg Oral Daily   budesonide -glycopyrrolate -formoterol   2 puff Inhalation BID   carvedilol   6.25 mg Oral BID WC   isosorbide  mononitrate  30 mg Oral Daily   pantoprazole  (PROTONIX ) IV  40 mg Intravenous Q12H   Continuous Infusions:   LOS: 0 days  MDM: Patient is high risk for one or more organ failure.  They necessitate ongoing hospitalization for continued IV therapies and subsequent lab monitoring. Total time spent interpreting labs and vitals, reviewing the medical record, coordinating care amongst consultants and care team members, directly assessing and discussing care with the patient and/or family: 55 min  Leslie Stith, DO [facility]  To contact the attending physician between 7A-7P please use Epic Chat. To contact the covering physician during after hours 7P-7A, please review Amion.  02/29/2024, 3:30 PM   *This document has been created with the assistance of dictation software. Please excuse typographical errors. *

## 2024-03-01 DIAGNOSIS — N179 Acute kidney failure, unspecified: Secondary | ICD-10-CM | POA: Diagnosis not present

## 2024-03-01 DIAGNOSIS — J9601 Acute respiratory failure with hypoxia: Secondary | ICD-10-CM | POA: Diagnosis not present

## 2024-03-01 DIAGNOSIS — Z7189 Other specified counseling: Secondary | ICD-10-CM | POA: Diagnosis not present

## 2024-03-01 DIAGNOSIS — D631 Anemia in chronic kidney disease: Secondary | ICD-10-CM | POA: Diagnosis not present

## 2024-03-01 DIAGNOSIS — I509 Heart failure, unspecified: Secondary | ICD-10-CM | POA: Diagnosis not present

## 2024-03-01 DIAGNOSIS — I482 Chronic atrial fibrillation, unspecified: Secondary | ICD-10-CM | POA: Diagnosis not present

## 2024-03-01 DIAGNOSIS — I5023 Acute on chronic systolic (congestive) heart failure: Secondary | ICD-10-CM | POA: Diagnosis not present

## 2024-03-01 DIAGNOSIS — N184 Chronic kidney disease, stage 4 (severe): Secondary | ICD-10-CM | POA: Diagnosis not present

## 2024-03-01 LAB — CBC WITH DIFFERENTIAL/PLATELET
Abs Immature Granulocytes: 0.01 K/uL (ref 0.00–0.07)
Basophils Absolute: 0 K/uL (ref 0.0–0.1)
Basophils Relative: 0 %
Eosinophils Absolute: 0 K/uL (ref 0.0–0.5)
Eosinophils Relative: 1 %
HCT: 30.5 % — ABNORMAL LOW (ref 36.0–46.0)
Hemoglobin: 9.5 g/dL — ABNORMAL LOW (ref 12.0–15.0)
Immature Granulocytes: 0 %
Lymphocytes Relative: 29 %
Lymphs Abs: 1 K/uL (ref 0.7–4.0)
MCH: 30.4 pg (ref 26.0–34.0)
MCHC: 31.1 g/dL (ref 30.0–36.0)
MCV: 97.4 fL (ref 80.0–100.0)
Monocytes Absolute: 0.4 K/uL (ref 0.1–1.0)
Monocytes Relative: 10 %
Neutro Abs: 2 K/uL (ref 1.7–7.7)
Neutrophils Relative %: 60 %
Platelets: 156 K/uL (ref 150–400)
RBC: 3.13 MIL/uL — ABNORMAL LOW (ref 3.87–5.11)
RDW: 15.9 % — ABNORMAL HIGH (ref 11.5–15.5)
WBC: 3.4 K/uL — ABNORMAL LOW (ref 4.0–10.5)
nRBC: 0 % (ref 0.0–0.2)

## 2024-03-01 LAB — COMPREHENSIVE METABOLIC PANEL WITH GFR
ALT: 18 U/L (ref 0–44)
AST: 23 U/L (ref 15–41)
Albumin: 2.7 g/dL — ABNORMAL LOW (ref 3.5–5.0)
Alkaline Phosphatase: 63 U/L (ref 38–126)
Anion gap: 12 (ref 5–15)
BUN: 80 mg/dL — ABNORMAL HIGH (ref 8–23)
CO2: 23 mmol/L (ref 22–32)
Calcium: 8.3 mg/dL — ABNORMAL LOW (ref 8.9–10.3)
Chloride: 99 mmol/L (ref 98–111)
Creatinine, Ser: 3.08 mg/dL — ABNORMAL HIGH (ref 0.44–1.00)
GFR, Estimated: 14 mL/min — ABNORMAL LOW (ref >60)
Glucose, Bld: 111 mg/dL — ABNORMAL HIGH (ref 70–99)
Potassium: 4.8 mmol/L (ref 3.5–5.1)
Sodium: 134 mmol/L — ABNORMAL LOW (ref 135–145)
Total Bilirubin: 1 mg/dL (ref 0.0–1.2)
Total Protein: 5.3 g/dL — ABNORMAL LOW (ref 6.5–8.1)

## 2024-03-01 LAB — MAGNESIUM: Magnesium: 1.9 mg/dL (ref 1.7–2.4)

## 2024-03-01 LAB — PHOSPHORUS: Phosphorus: 4 mg/dL (ref 2.5–4.6)

## 2024-03-01 MED ORDER — PANTOPRAZOLE SODIUM 40 MG PO TBEC: 40.0000 mg | DELAYED_RELEASE_TABLET | Freq: Two times a day (BID) | ORAL | 0 refills | Status: AC

## 2024-03-01 MED ORDER — PANTOPRAZOLE SODIUM 40 MG PO TBEC
40.0000 mg | DELAYED_RELEASE_TABLET | Freq: Two times a day (BID) | ORAL | Status: DC
  Administered 2024-03-01 – 2024-03-02 (×4): 40 mg via ORAL
  Filled 2024-03-01 (×3): qty 1

## 2024-03-01 NOTE — Progress Notes (Addendum)
 Still awaiting response calls from patient's family so that a ride can be arrange with them for the patient to be able to go home. Patient, Care RN and Case Manager have called patient's family for pick a few times and also left messages.

## 2024-03-01 NOTE — Progress Notes (Incomplete)
 Patient discharged to home accompanied by family with all belongings. A+Ox4. VSS. Medications and discharge instructions reviewed. All questions answered. PIV x 2 removed, no bleeding, intact. Patient verbalized signs and symptoms of infection. Patient agreed to follow up with appointments as listed on AVS. Patient satisfied with overall care at [facility].

## 2024-03-01 NOTE — Plan of Care (Signed)
 Problem: Education: Goal: Knowledge of General Education information will improve Description: Including pain rating scale, medication(s)/side effects and non-pharmacologic comfort measures 03/01/2024 1257 by Matheau Orona N, RN Outcome: Adequate for Discharge 03/01/2024 1050 by Kenard Marleen SAILOR, RN Outcome: Progressing   Problem: Health Behavior/Discharge Planning: Goal: Ability to manage health-related needs will improve 03/01/2024 1257 by Avyan Livesay N, RN Outcome: Adequate for Discharge 03/01/2024 1050 by Kenard Marleen SAILOR, RN Outcome: Progressing   Problem: Clinical Measurements: Goal: Ability to maintain clinical measurements within normal limits will improve 03/01/2024 1257 by Cynara Tatham N, RN Outcome: Adequate for Discharge 03/01/2024 1050 by Kenard Marleen SAILOR, RN Outcome: Progressing Goal: Will remain free from infection 03/01/2024 1257 by Kenard Marleen SAILOR, RN Outcome: Adequate for Discharge 03/01/2024 1050 by Kenard Marleen SAILOR, RN Outcome: Progressing Goal: Diagnostic test results will improve 03/01/2024 1257 by Kenard Marleen SAILOR, RN Outcome: Adequate for Discharge 03/01/2024 1050 by Kenard Marleen SAILOR, RN Outcome: Progressing Goal: Respiratory complications will improve 03/01/2024 1257 by Jonasia Coiner N, RN Outcome: Adequate for Discharge 03/01/2024 1050 by Kenard Marleen SAILOR, RN Outcome: Progressing Goal: Cardiovascular complication will be avoided 03/01/2024 1257 by Kenard Marleen SAILOR, RN Outcome: Adequate for Discharge 03/01/2024 1050 by Kenard Marleen SAILOR, RN Outcome: Progressing   Problem: Activity: Goal: Risk for activity intolerance will decrease 03/01/2024 1257 by Kenard Marleen SAILOR, RN Outcome: Adequate for Discharge 03/01/2024 1050 by Kenard Marleen SAILOR, RN Outcome: Progressing   Problem: Nutrition: Goal: Adequate nutrition will be maintained 03/01/2024 1257 by Kenard Marleen SAILOR, RN Outcome: Adequate for Discharge 03/01/2024 1050 by Kenard Marleen SAILOR, RN Outcome:  Progressing   Problem: Coping: Goal: Level of anxiety will decrease 03/01/2024 1257 by Kenard Marleen SAILOR, RN Outcome: Adequate for Discharge 03/01/2024 1050 by Kenard Marleen SAILOR, RN Outcome: Progressing   Problem: Elimination: Goal: Will not experience complications related to bowel motility 03/01/2024 1257 by Kenard Marleen SAILOR, RN Outcome: Adequate for Discharge 03/01/2024 1050 by Kenard Marleen SAILOR, RN Outcome: Progressing Goal: Will not experience complications related to urinary retention 03/01/2024 1257 by Kenard Marleen SAILOR, RN Outcome: Adequate for Discharge 03/01/2024 1050 by Kenard Marleen SAILOR, RN Outcome: Progressing   Problem: Pain Managment: Goal: General experience of comfort will improve and/or be controlled 03/01/2024 1257 by Kenard Marleen SAILOR, RN Outcome: Adequate for Discharge 03/01/2024 1050 by Kenard Marleen SAILOR, RN Outcome: Progressing   Problem: Safety: Goal: Ability to remain free from injury will improve 03/01/2024 1257 by Kenard Marleen SAILOR, RN Outcome: Adequate for Discharge 03/01/2024 1050 by Kenard Marleen SAILOR, RN Outcome: Progressing   Problem: Skin Integrity: Goal: Risk for impaired skin integrity will decrease 03/01/2024 1257 by Kenard Marleen SAILOR, RN Outcome: Adequate for Discharge 03/01/2024 1050 by Kenard Marleen SAILOR, RN Outcome: Progressing   Problem: Education: Goal: Ability to demonstrate management of disease process will improve 03/01/2024 1257 by Yuritzi Kamp N, RN Outcome: Adequate for Discharge 03/01/2024 1050 by Kenard Marleen SAILOR, RN Outcome: Progressing Goal: Ability to verbalize understanding of medication therapies will improve 03/01/2024 1257 by Vir Whetstine N, RN Outcome: Adequate for Discharge 03/01/2024 1050 by Kenard Marleen SAILOR, RN Outcome: Progressing Goal: Individualized Educational Video(s) 03/01/2024 1257 by Deedee Lybarger N, RN Outcome: Adequate for Discharge 03/01/2024 1050 by Kenard Marleen SAILOR, RN Outcome: Progressing   Problem:  Activity: Goal: Capacity to carry out activities will improve 03/01/2024 1257 by Bruchy Mikel N, RN Outcome: Adequate for Discharge 03/01/2024 1050 by Kenard Marleen SAILOR, RN Outcome: Progressing   Problem: Cardiac: Goal: Ability to achieve and maintain  adequate cardiopulmonary perfusion will improve 03/01/2024 1257 by Corryn Madewell N, RN Outcome: Adequate for Discharge 03/01/2024 1050 by Kenard Marleen SAILOR, RN Outcome: Progressing

## 2024-03-01 NOTE — Progress Notes (Signed)
 [facility]  ROUNDING NOTE   Subjective:   Patient seen laying in bed No family present Reports appropriate appetite Denies nausea vomiting  Creatinine 3.08  Objective:  Vital signs in last 24 hours:  Temp:  [97.4 F (36.3 C)-98.5 F (36.9 C)] 98.5 F (36.9 C) (08/10 0847) Pulse Rate:  [60-64] 64 (08/10 0847) Resp:  [16-18] 18 (08/10 0847) BP: (100-135)/(48-63) 108/63 (08/10 0847) SpO2:  [92 %-97 %] 93 % (08/10 0847) Weight:  [69.8 kg] 69.8 kg (08/10 0500)  Weight change: 0 kg Filed Weights   02/25/24 0122 02/29/24 0500 03/01/24 0500  Weight: 72 kg 69.8 kg 69.8 kg    Intake/Output: I/O last 3 completed shifts: In: 240 [P.O.:240] Out: 350 [Urine:350]   Intake/Output this shift:  Total I/O In: 210 [P.O.:210] Out: -   Physical Exam: General: NAD  Head: Normocephalic, atraumatic. Moist oral mucosal membranes  Eyes: Anicteric  Lungs:  Clear to auscultation, normal effort  Heart: Regular rate and rhythm  Abdomen:  Soft, nontender  Extremities:  No peripheral edema.  Neurologic: Awake, alert, conversant  Skin: Warm,dry, no rash       Basic Metabolic Panel: Recent Labs  Lab 02/26/24 0338 02/27/24 0325 02/28/24 0502 02/29/24 0526 03/01/24 0548  NA 138 136 136 135 134*  K 4.1 4.9 4.4 4.8 4.8  CL 103 102 101 102 99  CO2 25 24 24 25 23   GLUCOSE 129* 153* 118* 121* 111*  BUN 81* 85* 84* 80* 80*  CREATININE 3.30* 3.54* 3.05* 2.98* 3.08*  CALCIUM 8.2* 8.1* 8.4* 8.3* 8.3*  MG 1.9 1.6* 1.7 1.7 1.9  PHOS  --  4.8* 4.0 4.0 4.0    Liver Function Tests: Recent Labs  Lab 02/26/24 0338 02/27/24 0325 02/28/24 0502 02/29/24 0526 03/01/24 0548  AST 24 20 20 20 23   ALT 19 18 16 15 18   ALKPHOS 64 59 69 65 63  BILITOT 0.8 0.8 0.7 0.9 1.0  PROT 5.5* 5.4* 5.8* 5.4* 5.3*  ALBUMIN  2.8* 2.8* 2.9* 2.7* 2.7*   No results for input(s): LIPASE, AMYLASE in the last 168 hours. No results for input(s): AMMONIA in the last 168 hours.  CBC: Recent  Labs  Lab 02/25/24 0117 02/27/24 0325 02/28/24 0502 02/28/24 2211 02/29/24 0526 03/01/24 0548  WBC 4.0 3.7* 3.5*  --  5.7 3.4*  NEUTROABS 2.2 2.3 2.3  --  4.6 2.0  HGB 11.4* 9.9* 10.1* 11.1* 10.2* 9.5*  HCT 36.2 30.6* 31.9*  --  32.3* 30.5*  MCV 97.8 96.8 96.4  --  95.6 97.4  PLT 184 156 156  --  162 156    Cardiac Enzymes: No results for input(s): CKTOTAL, CKMB, CKMBINDEX, TROPONINI in the last 168 hours.  BNP: Invalid input(s): POCBNP  CBG: No results for input(s): GLUCAP in the last 168 hours.  Microbiology: Results for orders placed or performed during the hospital encounter of 12/14/23  Resp panel by RT-PCR (RSV, Flu A&B, Covid) Anterior Nasal Swab     Status: None   Collection Time: 12/14/23  8:27 AM   Specimen: Anterior Nasal Swab  Result Value Ref Range Status   SARS Coronavirus 2 by RT PCR NEGATIVE NEGATIVE Final    Comment: (NOTE) SARS-CoV-2 target nucleic acids are NOT DETECTED.  The SARS-CoV-2 RNA is generally detectable in upper respiratory specimens during the acute phase of infection. The lowest concentration of SARS-CoV-2 viral copies this assay can detect is 138 copies/mL. A negative result does not preclude SARS-Cov-2 infection and should not  be used as the sole basis for treatment or other patient management decisions. A negative result may occur with  improper specimen collection/handling, submission of specimen other than nasopharyngeal swab, presence of viral mutation(s) within the areas targeted by this assay, and inadequate number of viral copies(<138 copies/mL). A negative result must be combined with clinical observations, patient history, and epidemiological information. The expected result is Negative.  Fact Sheet for Patients:  BloggerCourse.com  Fact Sheet for Healthcare Providers:  SeriousBroker.it  This test is no t yet approved or cleared by the [place]  FDA and   has been authorized for detection and/or diagnosis of SARS-CoV-2 by FDA under an Emergency Use Authorization (EUA). This EUA will remain  in effect (meaning this test can be used) for the duration of the COVID-19 declaration under Section 564(b)(1) of the Act, 21 U.S.C.section 360bbb-3(b)(1), unless the authorization is terminated  or revoked sooner.       Influenza A by PCR NEGATIVE NEGATIVE Final   Influenza B by PCR NEGATIVE NEGATIVE Final    Comment: (NOTE) The Xpert Xpress SARS-CoV-2/FLU/RSV plus assay is intended as an aid in the diagnosis of influenza from Nasopharyngeal swab specimens and should not be used as a sole basis for treatment. Nasal washings and aspirates are unacceptable for Xpert Xpress SARS-CoV-2/FLU/RSV testing.  Fact Sheet for Patients: BloggerCourse.com  Fact Sheet for Healthcare Providers: SeriousBroker.it  This test is not yet approved or cleared by the [place]  FDA and has been authorized for detection and/or diagnosis of SARS-CoV-2 by FDA under an Emergency Use Authorization (EUA). This EUA will remain in effect (meaning this test can be used) for the duration of the COVID-19 declaration under Section 564(b)(1) of the Act, 21 U.S.C. section 360bbb-3(b)(1), unless the authorization is terminated or revoked.     Resp Syncytial Virus by PCR NEGATIVE NEGATIVE Final    Comment: (NOTE) Fact Sheet for Patients: BloggerCourse.com  Fact Sheet for Healthcare Providers: SeriousBroker.it  This test is not yet approved or cleared by the [place]  FDA and has been authorized for detection and/or diagnosis of SARS-CoV-2 by FDA under an Emergency Use Authorization (EUA). This EUA will remain in effect (meaning this test can be used) for the duration of the COVID-19 declaration under Section 564(b)(1) of the Act, 21 U.S.C. section 360bbb-3(b)(1),  unless the authorization is terminated or revoked.  Performed at [facility], [address] [place], [place] [address]     Coagulation Studies: No results for input(s): LABPROT, INR in the last 72 hours.  Urinalysis: No results for input(s): COLORURINE, LABSPEC, PHURINE, GLUCOSEU, HGBUR, BILIRUBINUR, KETONESUR, PROTEINUR, UROBILINOGEN, NITRITE, LEUKOCYTESUR in the last 72 hours.  Invalid input(s): APPERANCEUR    Imaging: DG Abd 1 View Result Date: 02/28/2024 CLINICAL DATA:  Abdominal pain EXAM: ABDOMEN - 1 VIEW COMPARISON:  11/27/2023 FINDINGS: Scattered large and small bowel gas is noted. Bilateral hip surgery is noted. Degenerative changes of the lumbar spine are seen with stable scoliosis concave to the right. No free air seen. No acute bony abnormality is noted. IMPRESSION: No acute abnormality seen. Electronically Signed   By: Oneil Devonshire M.D.   On: 02/28/2024 21:56   US  RENAL Result Date: 02/28/2024 EXAM: RETROPERITONEAL ULTRASOUND OF THE KIDNEYS AND URINARY BLADDER TECHNIQUE: Real-time ultrasonography of the retroperitoneum including the kidneys and urinary bladder was performed. COMPARISON: 06/28/2024 CT CLINICAL HISTORY: Acute kidney injury [facility]) FINDINGS: RIGHT KIDNEY: The right kidney measures 11.8 x 7.6 x 8 cm, 378 cc. Multiple right renal cortical cysts  measuring up to 8.1 cm. No hydronephrosis. LEFT KIDNEY: The left kidney measures 12.6 x 6.3 x 6.6 cm, 271 cc. Exophytic left renal cortical cyst 5 cm. No hydronephrosis. BLADDER: Unremarkable appearance of the bladder. SPLEEN: 2 cm splenic cyst. IMPRESSION: 1. No hydronephrosis. 2. Multiple  renal   cysts. 3. 2 cm splenic cyst. Electronically signed by: Dayne Hassell MD 02/28/2024 04:12 PM EDT RP Workstation: [id number]     Medications:     acetaminophen   1,000 mg Oral TID   amiodarone   200 mg Oral Daily   budesonide -glycopyrrolate -formoterol   2 puff Inhalation BID   carvedilol   6.25  mg Oral BID WC   isosorbide  mononitrate  30 mg Oral Daily   pantoprazole   40 mg Oral BID   alum & mag hydroxide-simeth, ipratropium-albuterol , naphazoline-pheniramine, ondansetron , polyethylene glycol  Assessment/ Plan:  Leslie Parker is a 89 y.o.  female with past medical conditions including COPD, diabetes, hypertension, bradycardia with pacemaker, atrial fibrillation, heart failure, and chronic kidney disease stage 4, who was admitted to [facility] on 02/25/2024 for Atypical chest pain [R07.89] CHF exacerbation (HCC) [I50.9] Chronic congestive heart failure, unspecified heart failure type (HCC) [I50.9]   Acute kidney injury on chronic kidney disease stage IV. Baseline creatinine 2.6 with GFR 17 on 01/29/24. Does not follow with outpatient nephrology. No IV contrast exposure. Acute kidney injury likely secondary to cardiorenal syndrome with volume overload. Patient was treated with IV furosemide , but now held. Creatinine peaked at 3.54. Creatinine slightly elevated today.  Continue to hold IV diuresis as volume status remains stable.  No acute indication for dialysis.  Patient will need to follow-up in our office at discharge.  Will defer discharge plan to primary team.  Lab Results  Component Value Date   CREATININE 3.08 (H) 03/01/2024   CREATININE 2.98 (H) 02/29/2024   CREATININE 3.05 (H) 02/28/2024    Intake/Output Summary (Last 24 hours) at 03/01/2024 1019 Last data filed at 03/01/2024 0900 Gross per 24 hour  Intake 330 ml  Output 250 ml  Net 80 ml   2. Anemia of chronic kidney disease Lab Results  Component Value Date   HGB 9.5 (L) 03/01/2024    Hemoglobin remains acceptable for renal patient.  3. 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  Diuretics remain held, patient appears euvolemic.    LOS: 0 Deanglo Hissong 8/10/202510:19 AM

## 2024-03-01 NOTE — Plan of Care (Signed)

## 2024-03-01 NOTE — TOC Progression Note (Addendum)
 Transition of Care [facility]) - Progression Note    Patient Details  Name: Leslie Parker MRN: [id number] Date of Birth: 1933-03-17  Transition [facility] Care [facility]) CM/SW Contact  Racheal LITTIE Schimke, RN Phone Number: 03/01/2024, 3:50 PM  Clinical Narrative:  Several call attempts made to family using numbers on file to discuss discharge plan for today, no reply Nurse advised to not discharge until we hear back from family.  4:06 pm Spoke with patient at bedside, She lives alone in [facility] senior apartment and has a [facility] nurse 3x per week. Daughter, [name], [phone] lives in [place] and will be coming to the hospital tomorrow and will talk with the doctor and transport her home. Provider notified.    Expected Discharge Plan: Home w Home Health Services Barriers to Discharge: Continued Medical Work up               Expected Discharge Plan and Services       Living arrangements for the past 2 months: Apartment Expected Discharge Date: 03/01/24                         HH Arranged: RN, PT, OT, Nurse's Aide HH Agency: [facility] Date [facility] Agency Contacted: 02/27/24   Representative spoke with at [facility] Agency: [place]   Social Drivers of Health (SDOH) Interventions SDOH Screenings   Food Insecurity: No Food Insecurity (02/25/2024)  Housing: Low Risk  (02/25/2024)  Transportation Needs: No Transportation Needs (02/25/2024)  Utilities: Not At Risk (02/25/2024)  Alcohol Screen: Low Risk  (08/20/2023)  Financial Resource Strain: Low Risk  (01/13/2024)   Received from [facility]  Social Connections: Moderately Isolated (02/25/2024)  Tobacco Use: Low Risk  (02/25/2024)    Readmission Risk Interventions    10/22/2023    2:28 PM 09/16/2023    2:52 PM 08/20/2023    3:45 PM  Readmission Risk Prevention Plan  Transportation Screening Complete Complete Complete  Medication Review Oceanographer) Complete Complete Complete  PCP or Specialist appointment within 3-5  days of discharge  Complete Complete  HRI or Home Care Consult Not Complete Patient refused Complete  HRI or Home Care Consult Pt Refusal Comments No PT recs at this time.    SW Recovery Care/Counseling Consult Complete Complete Complete  Palliative Care Screening Not Applicable Not Applicable Not Applicable  Skilled Nursing Facility Not Applicable Not Applicable Not Applicable

## 2024-03-01 NOTE — Discharge Summary (Addendum)
 Addendum: Patient was discharged on 8/10.  She did not leave the hospital due to difficulty getting a ride with one of her children.  Daughter then arrived and took patient home on 8/11.  I was able to evaluate the patient before she left and she maintained certainty in her decision to discharge home. No new or acute issues.   DISCHARGE SUMMARY    Leslie Parker FMW:[id number] DOB: Jul 02, 1933 DOA: 02/25/2024  PCP: Sherial Bail, MD  Admit date: 02/25/2024 Discharge date: 03/01/2024   Recommendations for Outpatient Follow-up:  Follow up with PCP in 1 weeks to recheck kidney function, hemoglobin, and blood pressure Referral to nephrology for kidney function monitoring Referral to gastroenterology to discuss colonoscopy given positive FOBT  Hospital Course: Leslie Parker is an 89 year old female with heart failure reduced EF, A-fib, CKD stage IV, COPD, diabetes, hypertension, bradycardia status post pacemaker who presents to the ED complaining of nonradiating substernal chest pain that started while she was in bed last night.  Patient was recently discharged 7/3 and during that admission was treated for heart failure exacerbation.  In the ED she was hemodynamically stable on 2 L nasal cannula, BNP chronically elevated above 4500, troponin slightly elevated but flat which appears consistent with baseline.  CXR revealed bilateral vascular congestion and effusions.  She was admitted for diuresis.  Patient diuresed well and quickly returned to room air.  Her kidney function remained problematic.  Nephrology was consulted and recommended patient continue to follow-up outpatient.  She remained stable and euvolemic without diuresis. Status briefly complicated by sudden abdominal pain and bloody diarrhea.  Hemoglobin remained stable.  Diarrhea and abdominal pain self resolved.  We discussed GI consult and colonoscopy but patient has refused at this time and would prefer outpatient follow-up.  Acute hypoxic  respiratory failure Acute on chronic combined systolic and diastolic CHF Chest pain - Echo 01/21/2024: EF 25 to 30%, mild LVH, grade 2 diastolic dysfunction, normal RV, mild MR. - Heart score 5, troponins elevated and flat in setting of CKD and heart failure exacerbation - On room air now - Clinically appears dry.  Lasix  discontinued on 8/6 -Continue with Imdur  and Coreg    Abdominal pain - FOBT positive.  Episode of abdominal pain followed by dark green-black stool. - Currently abdominal pain is resolved. - Hemoglobin remains stable and near baseline. - Discussed possible workup with the patient who is not interested in pursuing colonoscopy or GI consult at this time.  No further episodes in house.  GI referral at discharge   Acute hypoxic respiratory failure COPD - Required 2 L on arrival likely secondary to CHF exacerbation.  No wheezing on exam. - Continue Breztri . - Now stable on room air   Chronic A-fib Sick sinus syndrome - Currently rate controlled - Status post pacemaker - Continue home meds amiodarone  and Eliquis    AKI superimposed on CKD stage IV - Baseline creatinine: 2.2 - Creatinine on arrival: 3.2, peaked at 3.5, downtrending now - Likely acute worsening due to cardiorenal syndrome, volume overload, and IV diuretics. - Nephrology was consulted.  Patient not a candidate for dialysis given advanced age and comorbidities. - Outpatient referral to nephrology   Poor prognosis - Patient has had over 17 visits to the hospital in the last 12 months.  She has severe heart failure and kidney function is no longer tolerating IV diuresis - She is a poor candidate for hemodialysis.  She is progressively weaker with each visit. - I have discussed hospice with this  patient and she is refusing to consider any other option.  She would like to continue to come to the hospital for ongoing aggressive medical care.  Palliative care was also consulted and patient reiterated same goals.   Palliative care has signed off. - I did discuss our concerns directly with her granddaughter, POA, and granddaughter has similar concerns.  Granddaughter also reports that patient is endorsing same desires to discharge home with continued aggressive medical care.  Patient does maintain capacity and thus we will follow her wishes at this time  Discharge Instructions  Discharge Instructions     Ambulatory referral to Gastroenterology   Complete by: As directed    FOBT +   What is the reason for referral?: Colonoscopy   Ambulatory referral to Nephrology   Complete by: As directed    Call MD for:  difficulty breathing, headache or visual disturbances   Complete by: As directed    Call MD for:  persistant dizziness or light-headedness   Complete by: As directed    Call MD for:  persistant nausea and vomiting   Complete by: As directed    Call MD for:  severe uncontrolled pain   Complete by: As directed    Call MD for:  temperature >100.4   Complete by: As directed    Diet general   Complete by: As directed    Discharge instructions   Complete by: As directed    While admitted we made some changes to your blood pressure medications. Please review your discharge medications closely to ensure you are taking the correct meds at home. Please take your blood pressure once a day and keep a log. See your primary care doctor in one week to review this log and make further medication changes.  You have been referred to nephrology for follow up on your kidney function.   Increase activity slowly   Complete by: As directed       Allergies as of 03/01/2024       Reactions   Baclofen Other (See Comments)   Elevated Cr   Simvastatin Other (See Comments)   Pt denies Elevated LFTs with simva 40mg , stopped and resolved (see MD note 11/10/13)        Medication List     PAUSE taking these medications    losartan  25 MG tablet Wait to take this until your doctor or other care provider tells  you to start again. Commonly known as: Cozaar  Take 0.5 tablets (12.5 mg total) by mouth daily.   torsemide  20 MG tablet Wait to take this until your doctor or other care provider tells you to start again. Commonly known as: DEMADEX  Take 2 tablets (40 mg total) by mouth in the morning AND 1 tablet (20 mg total) daily in the afternoon.       STOP taking these medications    colestipol  1 g tablet Commonly known as: COLESTID    isosorbide  mononitrate 30 MG 24 hr tablet Commonly known as: IMDUR    Jardiance  10 MG Tabs tablet Generic drug: empagliflozin    metoprolol  tartrate 25 MG tablet Commonly known as: LOPRESSOR    potassium chloride  SA 20 MEQ tablet Commonly known as: KLOR-CON  M       TAKE these medications    acetaminophen  500 MG tablet Commonly known as: TYLENOL  Take 2 tablets (1,000 mg total) by mouth 3 (three) times daily.   albuterol  108 (90 Base) MCG/ACT inhaler Commonly known as: VENTOLIN  HFA Inhale 2 puffs into the lungs every 6 (  six) hours as needed.   alum & mag hydroxide-simeth 200-200-20 MG/5ML suspension Commonly known as: MAALOX/MYLANTA Take 30 mLs by mouth every 6 (six) hours as needed for indigestion or heartburn.   amiodarone  200 MG tablet Commonly known as: PACERONE  Take 1 tablet by mouth daily.   apixaban  2.5 MG Tabs tablet Commonly known as: ELIQUIS  Take 1 tablet (2.5 mg total) by mouth 2 (two) times daily.   ascorbic acid  250 MG tablet Commonly known as: VITAMIN C  Take 250 mg by mouth daily.   B-12 2500 MCG Tabs Take 2,500 mcg by mouth daily.   carvedilol  6.25 MG tablet Commonly known as: COREG  Take 1 tablet (6.25 mg total) by mouth 2 (two) times daily with a meal.   hydrALAZINE  25 MG tablet Commonly known as: APRESOLINE  25 mg 3 (three) times daily.   multivitamin with minerals Tabs tablet Take 1 tablet by mouth daily. One-A-Day Women's Vitamin   ondansetron  4 MG tablet Commonly known as: ZOFRAN  Take 1 tablet (4 mg total) by  mouth every 6 (six) hours as needed for nausea.   pantoprazole  40 MG tablet Commonly known as: PROTONIX  Take 1 tablet (40 mg total) by mouth 2 (two) times daily.   polyethylene glycol 17 g packet Commonly known as: MIRALAX  / GLYCOLAX  Take 17 g by mouth daily as needed.   senna-docusate 8.6-50 MG tablet Commonly known as: Senokot-S Take 2 tablets by mouth at bedtime as needed for mild constipation.   Trelegy Ellipta 100-62.5-25 MCG/ACT Aepb Generic drug: Fluticasone -Umeclidin-Vilant Inhale 1 puff into the lungs daily.   Vitamin D -1000 Max St 25 MCG (1000 UT) tablet Generic drug: Cholecalciferol  Take 1,000 Units by mouth daily.        Follow-up Information     [facility]. Go on 03/05/2024.   Specialty: Cardiology Why: Hospital Follow-Up 03/05/24 @ 3:30 PM Please bring all medications to follow-up. [facility], [address], [employer] Parking at the door. Contact information: [address] [place]  [address] [phone]               Allergies  Allergen Reactions   Baclofen Other (See Comments)    Elevated Cr   Simvastatin Other (See Comments)    Pt denies  Elevated LFTs with simva 40mg , stopped and resolved (see MD note 11/10/13)    Consultations: Treatment Team:  Dennise Capri, MD   Procedures/Studies: DG Abd 1 View Result Date: 02/28/2024 CLINICAL DATA:  Abdominal pain EXAM: ABDOMEN - 1 VIEW COMPARISON:  11/27/2023 FINDINGS: Scattered large and small bowel gas is noted. Bilateral hip surgery is noted. Degenerative changes of the lumbar spine are seen with stable scoliosis concave to the right. No free air seen. No acute bony abnormality is noted. IMPRESSION: No acute abnormality seen. Electronically Signed   By: Oneil Devonshire M.D.   On: 02/28/2024 21:56   US  RENAL Result Date: 02/28/2024 EXAM: RETROPERITONEAL ULTRASOUND OF THE KIDNEYS AND URINARY BLADDER  TECHNIQUE: Real-time ultrasonography of the retroperitoneum including the kidneys and urinary bladder was performed. COMPARISON: 06/28/2024 CT CLINICAL HISTORY: Acute kidney injury [facility]) FINDINGS: RIGHT KIDNEY: The right kidney measures 11.8 x 7.6 x 8 cm, 378 cc. Multiple right renal cortical cysts measuring up to 8.1 cm. No hydronephrosis. LEFT KIDNEY: The left kidney measures 12.6 x 6.3 x 6.6 cm, 271 cc. Exophytic left renal cortical cyst 5 cm. No hydronephrosis. BLADDER: Unremarkable appearance of the bladder. SPLEEN: 2 cm splenic cyst. IMPRESSION: 1.  No hydronephrosis. 2. Multiple  renal   cysts. 3. 2 cm splenic cyst. Electronically signed by: Katheleen Faes MD 02/28/2024 04:12 PM EDT RP Workstation: [id number]   DG Chest Port 1 View Result Date: 02/25/2024 EXAM: 1 VIEW XRAY OF THE CHEST 02/25/2024 01:48:50 AM COMPARISON: None available. CLINICAL HISTORY: [id number] Chest pain [id number]. PER ER NOTE; Pt arrived via [facility] from home with c/o sudden onset of left sided chest pain accompanied by SOB. Pt reports pain as sharp, dull, aching pain. 1.5 Inch nitroglycerin  paste applied by EMS as well as 324mg  Aspirin  in route to ED. ; ; **Pt has a pacemaker FINDINGS: LUNGS AND PLEURA: Small bilateral pleural effusions with basilar atelectasis. Pulmonary vascular congestion. HEART AND MEDIASTINUM: Mild cardiomegaly with calcific aortic atherosclerosis. BONES AND SOFT TISSUES: Unchanged position of left chest wall pacemaker. IMPRESSION: 1. Mild cardiomegaly with calcific aortic atherosclerosis and pulmonary vascular congestion. 2. Small bilateral pleural effusions with basilar atelectasis. Electronically signed by: Franky Stanford MD 02/25/2024 02:29 AM EDT RP Workstation: [id number]      Discharge Exam: Vitals:   03/01/24 0323 03/01/24 0847  BP: 100/61 108/63  Pulse: 61 64  Resp: 17 18  Temp: 97.7 F (36.5 C) 98.5 F (36.9 C)  SpO2: 92% 93%   Vitals:   02/29/24 2357 03/01/24 0323 03/01/24 0500 03/01/24 0847  BP:  (!) 110/55 100/61  108/63  Pulse: 62 61  64  Resp: 18 17  18   Temp: 97.7 F (36.5 C) 97.7 F (36.5 C)  98.5 F (36.9 C)  TempSrc:  Axillary    SpO2: 97% 92%  93%  Weight:   69.8 kg   Height:        Constitutional:  Normal appearance. Non toxic-appearing.  HENT: Head Normocephalic and atraumatic.  Mucous membranes are moist.  Eyes:  Extraocular intact. Conjunctivae normal.  Cardiovascular: Rate and Rhythm: Normal rate and regular rhythm.  Pulmonary: Non labored, symmetric rise of chest wall.  Skin: warm and dry. not jaundiced.  Neurological: No focal deficit present. alert. Oriented.  Psychiatric: Mood and Affect congruent.    The results of significant diagnostics from this hospitalization (including imaging, microbiology, ancillary and laboratory) are listed below for reference.     Microbiology: No results found for this or any previous visit (from the past 240 hours).   Labs: BNP (last 3 results) Recent Labs    01/20/24 0917 02/13/24 1516 02/25/24 0117  BNP >4,500.0* >4,500.0* >4,500.0*   Basic Metabolic Panel: Recent Labs  Lab 02/26/24 0338 02/27/24 0325 02/28/24 0502 02/29/24 0526 03/01/24 0548  NA 138 136 136 135 134*  K 4.1 4.9 4.4 4.8 4.8  CL 103 102 101 102 99  CO2 25 24 24 25 23   GLUCOSE 129* 153* 118* 121* 111*  BUN 81* 85* 84* 80* 80*  CREATININE 3.30* 3.54* 3.05* 2.98* 3.08*  CALCIUM 8.2* 8.1* 8.4* 8.3* 8.3*  MG 1.9 1.6* 1.7 1.7 1.9  PHOS  --  4.8* 4.0 4.0 4.0   Liver Function Tests: Recent Labs  Lab 02/26/24 0338 02/27/24 0325 02/28/24 0502 02/29/24 0526 03/01/24 0548  AST 24 20 20 20 23   ALT 19 18 16 15 18   ALKPHOS 64 59 69 65 63  BILITOT 0.8 0.8 0.7 0.9 1.0  PROT 5.5* 5.4* 5.8* 5.4* 5.3*  ALBUMIN  2.8* 2.8* 2.9* 2.7* 2.7*   No results for input(s): LIPASE, AMYLASE in the last 168 hours. No results for input(s): AMMONIA in the last 168 hours. CBC: Recent Labs  Lab 02/25/24 0117  02/27/24 0325 02/28/24 0502  02/28/24 2211 02/29/24 0526 03/01/24 0548  WBC 4.0 3.7* 3.5*  --  5.7 3.4*  NEUTROABS 2.2 2.3 2.3  --  4.6 2.0  HGB 11.4* 9.9* 10.1* 11.1* 10.2* 9.5*  HCT 36.2 30.6* 31.9*  --  32.3* 30.5*  MCV 97.8 96.8 96.4  --  95.6 97.4  PLT 184 156 156  --  162 156   Cardiac Enzymes: No results for input(s): CKTOTAL, CKMB, CKMBINDEX, TROPONINI in the last 168 hours. BNP: Invalid input(s): POCBNP CBG: No results for input(s): GLUCAP in the last 168 hours. D-Dimer No results for input(s): DDIMER in the last 72 hours. Hgb A1c No results for input(s): HGBA1C in the last 72 hours. Lipid Profile No results for input(s): CHOL, HDL, LDLCALC, TRIG, CHOLHDL, LDLDIRECT in the last 72 hours. Thyroid  function studies No results for input(s): TSH, T4TOTAL, T3FREE, THYROIDAB in the last 72 hours.  Invalid input(s): FREET3 Anemia work up No results for input(s): VITAMINB12, FOLATE, FERRITIN, TIBC, IRON, RETICCTPCT in the last 72 hours. Urinalysis    Component Value Date/Time   COLORURINE YELLOW (A) 11/05/2023 1505   APPEARANCEUR CLEAR (A) 11/05/2023 1505   APPEARANCEUR Clear 02/19/2013 0254   LABSPEC 1.012 11/05/2023 1505   LABSPEC 1.006 02/19/2013 0254   PHURINE 6.0 11/05/2023 1505   GLUCOSEU NEGATIVE 11/05/2023 1505   GLUCOSEU Negative 02/19/2013 0254   HGBUR NEGATIVE 11/05/2023 1505   BILIRUBINUR NEGATIVE 11/05/2023 1505   BILIRUBINUR Negative 02/19/2013 0254   KETONESUR NEGATIVE 11/05/2023 1505   PROTEINUR 100 (A) 11/05/2023 1505   NITRITE NEGATIVE 11/05/2023 1505   LEUKOCYTESUR NEGATIVE 11/05/2023 1505   LEUKOCYTESUR 1+ 02/19/2013 0254   Sepsis Labs Recent Labs  Lab 02/27/24 0325 02/28/24 0502 02/29/24 0526 03/01/24 0548  WBC 3.7* 3.5* 5.7 3.4*   Microbiology No results found for this or any previous visit (from the past 240 hours).   Time coordinating discharge: 32 min   SIGNED: Brayah Urquilla, DO [facility] 03/01/2024, 11:53 AM Pager   If 7PM-7AM, please contact night-coverage

## 2024-03-02 DIAGNOSIS — N184 Chronic kidney disease, stage 4 (severe): Secondary | ICD-10-CM | POA: Diagnosis not present

## 2024-03-02 DIAGNOSIS — I5042 Chronic combined systolic (congestive) and diastolic (congestive) heart failure: Secondary | ICD-10-CM | POA: Diagnosis not present

## 2024-03-02 DIAGNOSIS — N179 Acute kidney failure, unspecified: Secondary | ICD-10-CM | POA: Diagnosis not present

## 2024-03-02 DIAGNOSIS — D631 Anemia in chronic kidney disease: Secondary | ICD-10-CM | POA: Diagnosis not present

## 2024-03-02 LAB — CBC WITH DIFFERENTIAL/PLATELET
Abs Immature Granulocytes: 0.02 K/uL (ref 0.00–0.07)
Basophils Absolute: 0 K/uL (ref 0.0–0.1)
Basophils Relative: 1 %
Eosinophils Absolute: 0 K/uL (ref 0.0–0.5)
Eosinophils Relative: 1 %
HCT: 31.8 % — ABNORMAL LOW (ref 36.0–46.0)
Hemoglobin: 9.9 g/dL — ABNORMAL LOW (ref 12.0–15.0)
Immature Granulocytes: 1 %
Lymphocytes Relative: 25 %
Lymphs Abs: 0.8 K/uL (ref 0.7–4.0)
MCH: 30.3 pg (ref 26.0–34.0)
MCHC: 31.1 g/dL (ref 30.0–36.0)
MCV: 97.2 fL (ref 80.0–100.0)
Monocytes Absolute: 0.3 K/uL (ref 0.1–1.0)
Monocytes Relative: 10 %
Neutro Abs: 2 K/uL (ref 1.7–7.7)
Neutrophils Relative %: 62 %
Platelets: 163 K/uL (ref 150–400)
RBC: 3.27 MIL/uL — ABNORMAL LOW (ref 3.87–5.11)
RDW: 15.8 % — ABNORMAL HIGH (ref 11.5–15.5)
WBC: 3.2 K/uL — ABNORMAL LOW (ref 4.0–10.5)
nRBC: 0 % (ref 0.0–0.2)

## 2024-03-02 LAB — COMPREHENSIVE METABOLIC PANEL WITH GFR
ALT: 20 U/L (ref 0–44)
AST: 25 U/L (ref 15–41)
Albumin: 2.7 g/dL — ABNORMAL LOW (ref 3.5–5.0)
Alkaline Phosphatase: 66 U/L (ref 38–126)
Anion gap: 7 (ref 5–15)
BUN: 80 mg/dL — ABNORMAL HIGH (ref 8–23)
CO2: 24 mmol/L (ref 22–32)
Calcium: 8.5 mg/dL — ABNORMAL LOW (ref 8.9–10.3)
Chloride: 105 mmol/L (ref 98–111)
Creatinine, Ser: 3.18 mg/dL — ABNORMAL HIGH (ref 0.44–1.00)
GFR, Estimated: 13 mL/min — ABNORMAL LOW (ref >60)
Glucose, Bld: 111 mg/dL — ABNORMAL HIGH (ref 70–99)
Potassium: 5 mmol/L (ref 3.5–5.1)
Sodium: 136 mmol/L (ref 135–145)
Total Bilirubin: 0.9 mg/dL (ref 0.0–1.2)
Total Protein: 5.5 g/dL — ABNORMAL LOW (ref 6.5–8.1)

## 2024-03-02 LAB — PHOSPHORUS: Phosphorus: 3.9 mg/dL (ref 2.5–4.6)

## 2024-03-02 LAB — MAGNESIUM: Magnesium: 2.1 mg/dL (ref 1.7–2.4)

## 2024-03-02 MED ORDER — ORAL CARE MOUTH RINSE: 15.0000 mL | OROMUCOSAL | Status: DC | PRN

## 2024-03-02 NOTE — Progress Notes (Signed)
 [facility]  ROUNDING NOTE   Subjective:   Patient resting in bed Alert and oriented Worried she will miss her scheduled for MRI tomorrow.   Creatinine 3.18  Objective:  Vital signs in last 24 hours:  Temp:  [97.1 F (36.2 C)-97.9 F (36.6 C)] 97.9 F (36.6 C) (08/11 1219) Pulse Rate:  [59-64] 64 (08/11 1219) Resp:  [18-20] 20 (08/11 1219) BP: (101-135)/(54-74) 120/70 (08/11 1219) SpO2:  [93 %-98 %] 95 % (08/11 1219) Weight:  [69 kg] 69 kg (08/11 0455)  Weight change: -0.8 kg Filed Weights   02/29/24 0500 03/01/24 0500 03/02/24 0455  Weight: 69.8 kg 69.8 kg 69 kg    Intake/Output: I/O last 3 completed shifts: In: 570 [P.O.:570] Out: 300 [Urine:300]   Intake/Output this shift:  Total I/O In: 240 [P.O.:240] Out: -   Physical Exam: General: NAD  Head: Normocephalic, atraumatic. Moist oral mucosal membranes  Eyes: Anicteric  Lungs:  Clear to auscultation, normal effort  Heart: Regular rate and rhythm  Abdomen:  Soft, nontender  Extremities:  No peripheral edema.  Neurologic: Awake, alert, conversant  Skin: Warm,dry, no rash       Basic Metabolic Panel: Recent Labs  Lab 02/27/24 0325 02/28/24 0502 02/29/24 0526 03/01/24 0548 03/02/24 0616  NA 136 136 135 134* 136  K 4.9 4.4 4.8 4.8 5.0  CL 102 101 102 99 105  CO2 24 24 25 23 24   GLUCOSE 153* 118* 121* 111* 111*  BUN 85* 84* 80* 80* 80*  CREATININE 3.54* 3.05* 2.98* 3.08* 3.18*  CALCIUM 8.1* 8.4* 8.3* 8.3* 8.5*  MG 1.6* 1.7 1.7 1.9 2.1  PHOS 4.8* 4.0 4.0 4.0 3.9    Liver Function Tests: Recent Labs  Lab 02/27/24 0325 02/28/24 0502 02/29/24 0526 03/01/24 0548 03/02/24 0616  AST 20 20 20 23 25   ALT 18 16 15 18 20   ALKPHOS 59 69 65 63 66  BILITOT 0.8 0.7 0.9 1.0 0.9  PROT 5.4* 5.8* 5.4* 5.3* 5.5*  ALBUMIN  2.8* 2.9* 2.7* 2.7* 2.7*   No results for input(s): LIPASE, AMYLASE in the last 168 hours. No results for input(s): AMMONIA in the last 168 hours.  CBC: Recent  Labs  Lab 02/27/24 0325 02/28/24 0502 02/28/24 2211 02/29/24 0526 03/01/24 0548 03/02/24 0616  WBC 3.7* 3.5*  --  5.7 3.4* 3.2*  NEUTROABS 2.3 2.3  --  4.6 2.0 2.0  HGB 9.9* 10.1* 11.1* 10.2* 9.5* 9.9*  HCT 30.6* 31.9*  --  32.3* 30.5* 31.8*  MCV 96.8 96.4  --  95.6 97.4 97.2  PLT 156 156  --  162 156 163    Cardiac Enzymes: No results for input(s): CKTOTAL, CKMB, CKMBINDEX, TROPONINI in the last 168 hours.  BNP: Invalid input(s): POCBNP  CBG: No results for input(s): GLUCAP in the last 168 hours.  Microbiology: Results for orders placed or performed during the hospital encounter of 12/14/23  Resp panel by RT-PCR (RSV, Flu A&B, Covid) Anterior Nasal Swab     Status: None   Collection Time: 12/14/23  8:27 AM   Specimen: Anterior Nasal Swab  Result Value Ref Range Status   SARS Coronavirus 2 by RT PCR NEGATIVE NEGATIVE Final    Comment: (NOTE) SARS-CoV-2 target nucleic acids are NOT DETECTED.  The SARS-CoV-2 RNA is generally detectable in upper respiratory specimens during the acute phase of infection. The lowest concentration of SARS-CoV-2 viral copies this assay can detect is 138 copies/mL. A negative result does not preclude SARS-Cov-2 infection and should  not be used as the sole basis for treatment or other patient management decisions. A negative result may occur with  improper specimen collection/handling, submission of specimen other than nasopharyngeal swab, presence of viral mutation(s) within the areas targeted by this assay, and inadequate number of viral copies(<138 copies/mL). A negative result must be combined with clinical observations, patient history, and epidemiological information. The expected result is Negative.  Fact Sheet for Patients:  BloggerCourse.com  Fact Sheet for Healthcare Providers:  SeriousBroker.it  This test is no t yet approved or cleared by the [place]  FDA and   has been authorized for detection and/or diagnosis of SARS-CoV-2 by FDA under an Emergency Use Authorization (EUA). This EUA will remain  in effect (meaning this test can be used) for the duration of the COVID-19 declaration under Section 564(b)(1) of the Act, 21 U.S.C.section 360bbb-3(b)(1), unless the authorization is terminated  or revoked sooner.       Influenza A by PCR NEGATIVE NEGATIVE Final   Influenza B by PCR NEGATIVE NEGATIVE Final    Comment: (NOTE) The Xpert Xpress SARS-CoV-2/FLU/RSV plus assay is intended as an aid in the diagnosis of influenza from Nasopharyngeal swab specimens and should not be used as a sole basis for treatment. Nasal washings and aspirates are unacceptable for Xpert Xpress SARS-CoV-2/FLU/RSV testing.  Fact Sheet for Patients: BloggerCourse.com  Fact Sheet for Healthcare Providers: SeriousBroker.it  This test is not yet approved or cleared by the [place]  FDA and has been authorized for detection and/or diagnosis of SARS-CoV-2 by FDA under an Emergency Use Authorization (EUA). This EUA will remain in effect (meaning this test can be used) for the duration of the COVID-19 declaration under Section 564(b)(1) of the Act, 21 U.S.C. section 360bbb-3(b)(1), unless the authorization is terminated or revoked.     Resp Syncytial Virus by PCR NEGATIVE NEGATIVE Final    Comment: (NOTE) Fact Sheet for Patients: BloggerCourse.com  Fact Sheet for Healthcare Providers: SeriousBroker.it  This test is not yet approved or cleared by the [place]  FDA and has been authorized for detection and/or diagnosis of SARS-CoV-2 by FDA under an Emergency Use Authorization (EUA). This EUA will remain in effect (meaning this test can be used) for the duration of the COVID-19 declaration under Section 564(b)(1) of the Act, 21 U.S.C. section 360bbb-3(b)(1),  unless the authorization is terminated or revoked.  Performed at [facility], [address] [place], [place] [address]     Coagulation Studies: No results for input(s): LABPROT, INR in the last 72 hours.  Urinalysis: No results for input(s): COLORURINE, LABSPEC, PHURINE, GLUCOSEU, HGBUR, BILIRUBINUR, KETONESUR, PROTEINUR, UROBILINOGEN, NITRITE, LEUKOCYTESUR in the last 72 hours.  Invalid input(s): APPERANCEUR    Imaging: No results found.    Medications:     acetaminophen   1,000 mg Oral TID   amiodarone   200 mg Oral Daily   budesonide -glycopyrrolate -formoterol   2 puff Inhalation BID   carvedilol   6.25 mg Oral BID WC   isosorbide  mononitrate  30 mg Oral Daily   pantoprazole   40 mg Oral BID   alum & mag hydroxide-simeth, ipratropium-albuterol , naphazoline-pheniramine, ondansetron , mouth rinse, polyethylene glycol  Assessment/ Plan:  Leslie Parker is a 89 y.o.  female with past medical conditions including COPD, diabetes, hypertension, bradycardia with pacemaker, atrial fibrillation, heart failure, and chronic kidney disease stage 4, who was admitted to [facility] on 02/25/2024 for Atypical chest pain [R07.89] CHF exacerbation (HCC) [I50.9] Chronic congestive heart failure, unspecified heart failure type (HCC) [I50.9]   Acute  kidney injury on chronic kidney disease stage IV. Baseline creatinine 2.6 with GFR 17 on 01/29/24. Does not follow with outpatient nephrology. No IV contrast exposure. Acute kidney injury likely secondary to cardiorenal syndrome with volume overload. Patient was treated with IV furosemide , but now held. Creatinine peaked at 3.54. Creatinine remains stable. May represent new baseline. Will follow patient at discharge.   Lab Results  Component Value Date   CREATININE 3.18 (H) 03/02/2024   CREATININE 3.08 (H) 03/01/2024   CREATININE 2.98 (H) 02/29/2024    Intake/Output Summary (Last 24 hours) at 03/02/2024 1313 Last  data filed at 03/02/2024 0900 Gross per 24 hour  Intake 600 ml  Output 300 ml  Net 300 ml   2. Anemia of chronic kidney disease Lab Results  Component Value Date   HGB 9.9 (L) 03/02/2024    Hemoglobin within optimal range.  3. 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  Diuretics remain held, patient appears euvolemic.    LOS: 0 Leslie Parker 8/11/20251:13 PM

## 2024-03-02 NOTE — Plan of Care (Signed)

## 2024-03-02 NOTE — Plan of Care (Signed)
   Problem: Education: Goal: Knowledge of General Education information will improve Description Including pain rating scale, medication(s)/side effects and non-pharmacologic comfort measures Outcome: Progressing   Problem: Health Behavior/Discharge Planning: Goal: Ability to manage health-related needs will improve Outcome: Progressing

## 2024-03-02 NOTE — TOC Transition Note (Signed)
 Transition of Care [facility]) - Discharge Note   Patient Details  Name: Leslie Parker MRN: [id number] Date of Birth: 06/24/1933  Transition of Care [facility]) CM/SW Contact:  [name], LCSW Phone Number: 03/02/2024, 1:43 PM   Clinical Narrative:  Patient has orders to discharge home today. [facility] liaison is aware. No further concerns. CSW signing off.   Final next level of care: Home w Home Health Services Barriers to Discharge: Barriers Resolved   Patient Goals and CMS Choice            Discharge Placement                Patient to be transferred to facility by: Daughter   Patient and family notified of of transfer: 03/02/24  Discharge Plan and Services Additional resources added to the After Visit Summary for                            [facility] Arranged: RN, PT, OT, Nurse's Aide HH Agency: [facility] Date [facility] Agency Contacted: 03/02/24   Representative spoke with at [facility] Agency: Larraine  Social Drivers of Health (SDOH) Interventions SDOH Screenings   Food Insecurity: No Food Insecurity (02/25/2024)  Housing: Low Risk  (02/25/2024)  Transportation Needs: No Transportation Needs (02/25/2024)  Utilities: Not At Risk (02/25/2024)  Alcohol Screen: Low Risk  (08/20/2023)  Financial Resource Strain: Low Risk  (01/13/2024)   Received from [facility]  Social Connections: Moderately Isolated (02/25/2024)  Tobacco Use: Low Risk  (02/25/2024)     Readmission Risk Interventions    10/22/2023    2:28 PM 09/16/2023    2:52 PM 08/20/2023    3:45 PM  Readmission Risk Prevention Plan  Transportation Screening Complete Complete Complete  Medication Review Oceanographer) Complete Complete Complete  PCP or Specialist appointment within 3-5 days of discharge  Complete Complete  HRI or Home Care Consult Not Complete Patient refused Complete  HRI or Home Care Consult Pt Refusal Comments No PT recs at this time.    SW Recovery Care/Counseling Consult  Complete Complete Complete  Palliative Care Screening Not Applicable Not Applicable Not Applicable  Skilled Nursing Facility Not Applicable Not Applicable Not Applicable

## 2024-03-03 ENCOUNTER — Encounter: Admitting: Family

## 2024-03-03 ENCOUNTER — Ambulatory Visit (HOSPITAL_COMMUNITY)
Admission: RE | Admit: 2024-03-03 | Discharge: 2024-03-03 | Disposition: A | Discharge status: Home / Self Care | Source: Ambulatory Visit | Attending: Orthopedic Surgery | Admitting: Orthopedic Surgery

## 2024-03-03 DIAGNOSIS — M48061 Spinal stenosis, lumbar region without neurogenic claudication: Secondary | ICD-10-CM | POA: Diagnosis not present

## 2024-03-03 DIAGNOSIS — M51369 Other intervertebral disc degeneration, lumbar region without mention of lumbar back pain or lower extremity pain: Secondary | ICD-10-CM | POA: Diagnosis not present

## 2024-03-03 DIAGNOSIS — M47816 Spondylosis without myelopathy or radiculopathy, lumbar region: Secondary | ICD-10-CM | POA: Diagnosis not present

## 2024-03-03 DIAGNOSIS — M48062 Spinal stenosis, lumbar region with neurogenic claudication: Secondary | ICD-10-CM | POA: Diagnosis not present

## 2024-03-03 DIAGNOSIS — N281 Cyst of kidney, acquired: Secondary | ICD-10-CM | POA: Diagnosis not present

## 2024-03-04 ENCOUNTER — Encounter: Admitting: Family

## 2024-03-04 ENCOUNTER — Telehealth: Payer: Self-pay | Admitting: Internal Medicine

## 2024-03-04 NOTE — Progress Notes (Signed)
 Advanced Heart Failure Clinic Note    PCP: Sherial Bail, MD  Cardiologist: Florencio Kava, MD (last seen 07/25; returns 01/26)  Chief Complaint: shortness of breath   HPI:  Leslie Parker is a 89 y/o female with a history of COPD stage IV, PAF, HTN, DM, CKD, hypomagnesemia/ hypokalemia, hyponatremia, pacemaker due to SSS (04/24), asthma & chronic heart failure.   Echo 06/06/22: EF 60-65% with mild LVH, mild LAE, mild MR, mild/ moderate TR, mild Leslie Echo 02/13/23: EF 60-65% with moderate LVH, Grade I DD, mild MR  Admitted 03/20/23 due to shortness of breath, weakness and pedal edema. Chest x-ray done today shows retrocardiac strandy opacities like atelectasis. IV lasix  given. Completed course of antibiotics for pneumonia. CXR showed multifocal nodular opacity in the left lower lobe, possibly endobronchial. The largest nodule measures 11 mm. 59-month follow-up recommended to ensure resolution.   Admitted 04/30/23 due to shortness of breath and found to by hypoxic. Started on IV solumedrol and given IV magnesium  for Mg of 1.2. Lasix  had to be held due to rising creatinine. IVF given for continuing rise in creatinine. Renal ultrasound showing multiple bilateral renal cysts. Every other day furosemide  resumed.   Admitted 06/04/23 due to increased work of breathing over the past 2 to 3 days. Positive orthopnea and PND. Placed on bipap. BNP 1530. Cardiology consulted. IV lasix  given. Weaned off bipap and on to 3L oxygen & then weaned down to 1L  Admitted 06/16/23 due to worsening shortness of breath. Found to have elevated BNP. IV diuresed. Did not meet criteria for home oxygen after walk test completed. Metoprolol  stopped & carvedilol  started. Elevated troponin thought to be due to demand ischemia.  Echo 08/09/23: EF 25-30%, mild LVH, normal RV, moderate LAE, moderate MR  Seen last [facility] OV 12/24, patient has been admitted >12 times with majority of admissions being respiratory failure/ HF  exacerbation.   Admitted 01/20/24 with shortness of breath along with orthopnea. EMS found her hypoxic in low 80s. She previously declined hospice. On presentation's vitals stable on 2 L of oxygen, labs with increasing creatinine to 2.24 with baseline seems to be around 1.9-2.0, hemoglobin of 10.8 which is around her baseline, BNP more than 4500 which she had it for the past many months, troponin 36, VBG with CO2 of 40 otherwise normal. Chest x-ray with cardiomegaly and vascular congestion. Echo 01/21/24: EF 25-30%, mild LVH, G2DD, normal RV, mild MR. IV diuresed. Palliative consulted again. IV diuretic transitioned to oral diuretics. Remains on oxygen.   Admitted 02/25/24 with nonradiating chest pain while in bed the night prior. In the ED she was hemodynamically stable on 2 L nasal cannula, BNP chronically elevated above 4500, troponin slightly elevated but flat which appears consistent with baseline. CXR revealed bilateral vascular congestion and effusions. IV diuresed. Nephrology consulted due to worsening renal function. Developed sudden abdominal pain and bloody diarrhea. Hg stable. Declined GI consults/ colonoscopy. Tropinins elevated and flat. Weaned to room air. Not a dialysis candidate due to age/ comorbidities. Palliative care consulted.   She presents today, with her daughter, for a post-hospital visit with a chief complaint of shortness of breath. Has associated fatigue, weakness, pedal edema. Appetite good. Some medications have been held due to renal function.   ROS: All systems negative except as listed in HPI, PMH and Problem List.  SH:  Social History   Socioeconomic History   Marital status: Divorced    Spouse name: Not on file   Number of children: 4  Years of education: Not on file   Highest education level: 12th grade  Occupational History   Occupation: Retitred/Disability  Tobacco Use   Smoking status: Never   Smokeless tobacco: Never  Vaping Use   Vaping status: Never  Used  Substance and Sexual Activity   Alcohol use: No    Alcohol/week: 0.0 standard drinks of alcohol   Drug use: No   Sexual activity: Not Currently  Other Topics Concern   Not on file  Social History Narrative   Not on file   Social Drivers of Health   Financial Resource Strain: Low Risk  (01/13/2024)   Received from [facility]   Overall Financial Resource Strain (CARDIA)    Difficulty of Paying Living Expenses: Not hard at all  Food Insecurity: No Food Insecurity (02/25/2024)   Hunger Vital Sign    Worried About Running Out of Food in the Last Year: Never true    Ran Out of Food in the Last Year: Never true  Transportation Needs: No Transportation Needs (02/25/2024)   PRAPARE - Administrator, Civil Service (Medical): No    Lack of Transportation (Non-Medical): No  Physical Activity: Not on file  Stress: Not on file  Social Connections: Moderately Isolated (02/25/2024)   Social Connection and Isolation Panel    Frequency of Communication with Friends and Family: More than three times a week    Frequency of Social Gatherings with Friends and Family: Three times a week    Attends Religious Services: 1 to 4 times per year    Active Member of Clubs or Organizations: No    Attends Banker Meetings: Never    Marital Status: Divorced  Catering manager Violence: Not At Risk (02/25/2024)   Humiliation, Afraid, Rape, and Kick questionnaire    Fear of Current or Ex-Partner: No    Emotionally Abused: No    Physically Abused: No    Sexually Abused: No    FH:  Family History  Problem Relation Age of Onset   Heart failure Mother    Hypertension Mother    Emphysema Father     Past Medical History:  Diagnosis Date   Acute kidney injury superimposed on CKD (HCC) 11/24/2022   Asthma    Atrial fibrillation with RVR (HCC)    Cancer (HCC)    Basal Cell   CHF (congestive heart failure) (HCC)    Closed left hip fracture (HCC) 01/25/2017    Closed right hip fracture (HCC) 02/13/2023   Diabetes mellitus without complication (HCC)    Femur fracture, left (HCC) 02/03/2015   Heart murmur    Hypertension    Impetigo    Osteoarthritis    Osteopenia    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   Rapid atrial fibrillation, new onset(HCC) 06/05/2022   Vomiting    can not due to surgery   Wears dentures    full upper and lower    Current Outpatient Medications  Medication Sig Dispense Refill   acetaminophen  (TYLENOL ) 500 MG tablet Take 2 tablets (1,000 mg total) by mouth 3 (three) times daily.     albuterol  (VENTOLIN  HFA) 108 (90 Base) MCG/ACT inhaler Inhale 2 puffs into the lungs every 6 (six) hours as needed.     alum & mag hydroxide-simeth (MAALOX/MYLANTA) 200-200-20 MG/5ML suspension Take 30 mLs by mouth every 6 (six) hours as needed for indigestion or heartburn.     amiodarone  (PACERONE ) 200 MG tablet Take 1 tablet by mouth daily.  apixaban  (ELIQUIS ) 2.5 MG TABS tablet Take 1 tablet (2.5 mg total) by mouth 2 (two) times daily. 60 tablet 1   ascorbic acid  (VITAMIN C ) 250 MG tablet Take 250 mg by mouth daily.     carvedilol  (COREG ) 6.25 MG tablet Take 1 tablet (6.25 mg total) by mouth 2 (two) times daily with a meal. 60 tablet 0   Cholecalciferol  (VITAMIN D -1000 MAX ST) 25 MCG (1000 UT) tablet Take 1,000 Units by mouth daily.     Cyanocobalamin  (B-12) 2500 MCG TABS Take 2,500 mcg by mouth daily.     hydrALAZINE  (APRESOLINE ) 25 MG tablet 25 mg 3 (three) times daily.     [Paused] losartan  (COZAAR ) 25 MG tablet Take 0.5 tablets (12.5 mg total) by mouth daily. 30 tablet 0   Multiple Vitamin (MULTIVITAMIN WITH MINERALS) TABS tablet Take 1 tablet by mouth daily. One-A-Day Women's Vitamin     ondansetron  (ZOFRAN ) 4 MG tablet Take 1 tablet (4 mg total) by mouth every 6 (six) hours as needed for nausea. 20 tablet 0   pantoprazole  (PROTONIX ) 40 MG tablet Take 1 tablet (40 mg total) by mouth 2 (two) times daily. 60 tablet 0   polyethylene  glycol (MIRALAX  / GLYCOLAX ) 17 g packet Take 17 g by mouth daily as needed.     senna-docusate (SENOKOT-S) 8.6-50 MG tablet Take 2 tablets by mouth at bedtime as needed for mild constipation.     TRELEGY ELLIPTA 100-62.5-25 MCG/ACT AEPB Inhale 1 puff into the lungs daily. (Patient not taking: Reported on 02/25/2024)     No current facility-administered medications for this visit.   Vitals:   03/05/24 1527  BP: (!) 147/70  Pulse: 62  SpO2: 95%  Weight: 154 lb (69.9 kg)   Wt Readings from Last 3 Encounters:  03/05/24 154 lb (69.9 kg)  03/02/24 152 lb 1.9 oz (69 kg)  02/13/24 158 lb (71.7 kg)   Lab Results  Component Value Date   CREATININE 3.18 (H) 03/02/2024   CREATININE 3.08 (H) 03/01/2024   CREATININE 2.98 (H) 02/29/2024    PHYSICAL EXAM:  General: Frail, elderly female in wheelchair Cor: No JVD. Irregular rhythm, normal rate.  Lungs: clear Abdomen: soft, nontender, nondistended. Extremities: 1+ pitting edema bilateral mid shins Neuro:. Affect pleasant   ECG: not done   ASSESSMENT & PLAN:  1: NICM with preserved ejection fraction- - suspect due to AF/ COPD/ ? OSA - NYHA Parker III - euvolemic today - weight down 4 pounds from last visit here 3 weeks ago - Echo 06/06/22: EF 60-65% with mild LVH, mild LAE, mild MR, mild/ moderate TR, mild Leslie - Echo 02/13/23: EF 60-65% with moderate LVH, Grade I DD, mild MR - Echo 08/09/23: EF 25-30%, mild LVH, normal RV, moderate LAE, moderate MR - Echo 01/21/24: EF 25-30%, mild LVH, G2DD, normal RV, mild MR. IV diuresed. - continue carvedilol  6.25mg  BID - torsemide  currently held due to renal function - Jardiance  stopped due to renal function - losartan  currently paused due to renal function - renal function will not allow for MRA - wear compression socks daily - BNP 02/25/24 was >4500.0; BNP today - Lengthy discussion regarding her health, multiple admissions and worsening renal function. Discussed the different in palliative care vs  hospice and she says that she's currently not interested in hospice. She is agreeable to palliative care so that referral was placed today  2: HTN- - BP 147/70 - saw PCP Deretha) 06/25 - continue carvedilol  6.25mg  BID  3: Atrial fibrillation- -  saw cardiology Philippe) 07/25; returns 01/26 - continue amiodarone  200mg  daily - continue apixaban  2.5mg  BID - continue carvedilol  6.25mg  BID  4: DM- - A1c 01/29/24 was 7.0%  5: COPD- - saw pulmonology Alica) 07/24; returns 09/25 - does not wear oxygen  6: CKD- - BMP 03/02/24 reviewed: sodium 136, potassium 5.0, creatinine 3.18 & GFR 13 - sees nephrology Jil) 08/25 - BMET today   Return in 1 month, sooner if needed.   Leslie Class, FNP-C 03/05/24

## 2024-03-04 NOTE — Telephone Encounter (Signed)
 Called to confirm/remind patient of their appointment at the [facility] Failure Clinic on 03/05/24.   Appointment:   [] Confirmed  [x] Left mess   [] No answer/No voice mail  [] VM Full/unable to leave message  [] Phone not in service  Patient reminded to bring all medications and/or complete list.  Confirmed patient has transportation. Gave directions, instructed to utilize valet parking.

## 2024-03-05 ENCOUNTER — Ambulatory Visit (HOSPITAL_BASED_OUTPATIENT_CLINIC_OR_DEPARTMENT_OTHER): Admitting: Family

## 2024-03-05 ENCOUNTER — Encounter: Payer: Self-pay | Admitting: Family

## 2024-03-05 ENCOUNTER — Encounter: Admitting: Family

## 2024-03-05 ENCOUNTER — Other Ambulatory Visit
Admission: RE | Admit: 2024-03-05 | Discharge: 2024-03-05 | Disposition: A | Discharge status: Home / Self Care | Source: Ambulatory Visit | Attending: Family | Admitting: Family

## 2024-03-05 VITALS — BP 147/70 | HR 62 | Wt 154.0 lb

## 2024-03-05 DIAGNOSIS — E876 Hypokalemia: Secondary | ICD-10-CM | POA: Diagnosis not present

## 2024-03-05 DIAGNOSIS — Z950 Presence of cardiac pacemaker: Secondary | ICD-10-CM | POA: Insufficient documentation

## 2024-03-05 DIAGNOSIS — Z79899 Other long term (current) drug therapy: Secondary | ICD-10-CM | POA: Insufficient documentation

## 2024-03-05 DIAGNOSIS — J449 Chronic obstructive pulmonary disease, unspecified: Secondary | ICD-10-CM

## 2024-03-05 DIAGNOSIS — N189 Chronic kidney disease, unspecified: Secondary | ICD-10-CM | POA: Insufficient documentation

## 2024-03-05 DIAGNOSIS — I5032 Chronic diastolic (congestive) heart failure: Secondary | ICD-10-CM

## 2024-03-05 DIAGNOSIS — J4489 Other specified chronic obstructive pulmonary disease: Secondary | ICD-10-CM | POA: Insufficient documentation

## 2024-03-05 DIAGNOSIS — N185 Chronic kidney disease, stage 5: Secondary | ICD-10-CM | POA: Diagnosis not present

## 2024-03-05 DIAGNOSIS — E1122 Type 2 diabetes mellitus with diabetic chronic kidney disease: Secondary | ICD-10-CM | POA: Insufficient documentation

## 2024-03-05 DIAGNOSIS — I480 Paroxysmal atrial fibrillation: Secondary | ICD-10-CM | POA: Diagnosis not present

## 2024-03-05 DIAGNOSIS — I10 Essential (primary) hypertension: Secondary | ICD-10-CM

## 2024-03-05 DIAGNOSIS — Z7901 Long term (current) use of anticoagulants: Secondary | ICD-10-CM | POA: Diagnosis not present

## 2024-03-05 DIAGNOSIS — E871 Hypo-osmolality and hyponatremia: Secondary | ICD-10-CM | POA: Diagnosis not present

## 2024-03-05 DIAGNOSIS — I130 Hypertensive heart and chronic kidney disease with heart failure and stage 1 through stage 4 chronic kidney disease, or unspecified chronic kidney disease: Secondary | ICD-10-CM | POA: Diagnosis not present

## 2024-03-05 DIAGNOSIS — I428 Other cardiomyopathies: Secondary | ICD-10-CM | POA: Insufficient documentation

## 2024-03-05 DIAGNOSIS — I495 Sick sinus syndrome: Secondary | ICD-10-CM | POA: Diagnosis not present

## 2024-03-05 DIAGNOSIS — I482 Chronic atrial fibrillation, unspecified: Secondary | ICD-10-CM

## 2024-03-05 DIAGNOSIS — N184 Chronic kidney disease, stage 4 (severe): Secondary | ICD-10-CM | POA: Diagnosis not present

## 2024-03-05 DIAGNOSIS — R0602 Shortness of breath: Secondary | ICD-10-CM | POA: Diagnosis present

## 2024-03-05 LAB — BRAIN NATRIURETIC PEPTIDE: B Natriuretic Peptide: >4500 pg/mL — ABNORMAL HIGH (ref 0.0–100.0)

## 2024-03-05 LAB — BASIC METABOLIC PANEL WITH GFR
Anion gap: 10 (ref 5–15)
BUN: 83 mg/dL — ABNORMAL HIGH (ref 8–23)
CO2: 22 mmol/L (ref 22–32)
Calcium: 8.5 mg/dL — ABNORMAL LOW (ref 8.9–10.3)
Chloride: 104 mmol/L (ref 98–111)
Creatinine, Ser: 3.11 mg/dL — ABNORMAL HIGH (ref 0.44–1.00)
GFR, Estimated: 14 mL/min — ABNORMAL LOW (ref >60)
Glucose, Bld: 101 mg/dL — ABNORMAL HIGH (ref 70–99)
Potassium: 4.2 mmol/L (ref 3.5–5.1)
Sodium: 136 mmol/L (ref 135–145)

## 2024-03-06 ENCOUNTER — Ambulatory Visit: Payer: Self-pay | Admitting: Family

## 2024-03-11 ENCOUNTER — Emergency Department

## 2024-03-11 ENCOUNTER — Other Ambulatory Visit: Payer: Self-pay

## 2024-03-11 ENCOUNTER — Inpatient Hospital Stay
Admission: EM | Admit: 2024-03-11 | Discharge: 2024-03-24 | DRG: 291 | Disposition: A | Discharge status: Skilled Nursing Facility | Attending: Student | Admitting: Student

## 2024-03-11 DIAGNOSIS — E1122 Type 2 diabetes mellitus with diabetic chronic kidney disease: Secondary | ICD-10-CM | POA: Diagnosis present

## 2024-03-11 DIAGNOSIS — J90 Pleural effusion, not elsewhere classified: Secondary | ICD-10-CM | POA: Diagnosis not present

## 2024-03-11 DIAGNOSIS — I5043 Acute on chronic combined systolic (congestive) and diastolic (congestive) heart failure: Secondary | ICD-10-CM | POA: Diagnosis present

## 2024-03-11 DIAGNOSIS — R339 Retention of urine, unspecified: Secondary | ICD-10-CM | POA: Diagnosis present

## 2024-03-11 DIAGNOSIS — J441 Chronic obstructive pulmonary disease with (acute) exacerbation: Secondary | ICD-10-CM | POA: Diagnosis present

## 2024-03-11 DIAGNOSIS — E039 Hypothyroidism, unspecified: Secondary | ICD-10-CM | POA: Diagnosis present

## 2024-03-11 DIAGNOSIS — I2489 Other forms of acute ischemic heart disease: Secondary | ICD-10-CM | POA: Diagnosis present

## 2024-03-11 DIAGNOSIS — M1990 Unspecified osteoarthritis, unspecified site: Secondary | ICD-10-CM | POA: Diagnosis present

## 2024-03-11 DIAGNOSIS — N184 Chronic kidney disease, stage 4 (severe): Secondary | ICD-10-CM | POA: Diagnosis present

## 2024-03-11 DIAGNOSIS — J690 Pneumonitis due to inhalation of food and vomit: Secondary | ICD-10-CM | POA: Diagnosis present

## 2024-03-11 DIAGNOSIS — I130 Hypertensive heart and chronic kidney disease with heart failure and stage 1 through stage 4 chronic kidney disease, or unspecified chronic kidney disease: Secondary | ICD-10-CM | POA: Diagnosis present

## 2024-03-11 DIAGNOSIS — T68XXXA Hypothermia, initial encounter: Secondary | ICD-10-CM | POA: Insufficient documentation

## 2024-03-11 DIAGNOSIS — I10 Essential (primary) hypertension: Secondary | ICD-10-CM | POA: Diagnosis present

## 2024-03-11 DIAGNOSIS — S81811A Laceration without foreign body, right lower leg, initial encounter: Secondary | ICD-10-CM | POA: Diagnosis present

## 2024-03-11 DIAGNOSIS — R059 Cough, unspecified: Secondary | ICD-10-CM | POA: Diagnosis not present

## 2024-03-11 DIAGNOSIS — I480 Paroxysmal atrial fibrillation: Secondary | ICD-10-CM | POA: Diagnosis present

## 2024-03-11 DIAGNOSIS — R1310 Dysphagia, unspecified: Secondary | ICD-10-CM | POA: Diagnosis present

## 2024-03-11 DIAGNOSIS — E785 Hyperlipidemia, unspecified: Secondary | ICD-10-CM | POA: Diagnosis present

## 2024-03-11 DIAGNOSIS — M8580 Other specified disorders of bone density and structure, unspecified site: Secondary | ICD-10-CM | POA: Diagnosis present

## 2024-03-11 DIAGNOSIS — E43 Unspecified severe protein-calorie malnutrition: Secondary | ICD-10-CM | POA: Diagnosis not present

## 2024-03-11 DIAGNOSIS — Z7189 Other specified counseling: Secondary | ICD-10-CM | POA: Diagnosis not present

## 2024-03-11 DIAGNOSIS — W19XXXA Unspecified fall, initial encounter: Secondary | ICD-10-CM | POA: Diagnosis present

## 2024-03-11 DIAGNOSIS — R0602 Shortness of breath: Secondary | ICD-10-CM | POA: Diagnosis not present

## 2024-03-11 DIAGNOSIS — Z96611 Presence of right artificial shoulder joint: Secondary | ICD-10-CM | POA: Diagnosis present

## 2024-03-11 DIAGNOSIS — Z7401 Bed confinement status: Secondary | ICD-10-CM | POA: Diagnosis not present

## 2024-03-11 DIAGNOSIS — I495 Sick sinus syndrome: Secondary | ICD-10-CM | POA: Diagnosis present

## 2024-03-11 DIAGNOSIS — E1129 Type 2 diabetes mellitus with other diabetic kidney complication: Secondary | ICD-10-CM | POA: Diagnosis not present

## 2024-03-11 DIAGNOSIS — I482 Chronic atrial fibrillation, unspecified: Secondary | ICD-10-CM | POA: Diagnosis present

## 2024-03-11 DIAGNOSIS — R062 Wheezing: Secondary | ICD-10-CM | POA: Diagnosis not present

## 2024-03-11 DIAGNOSIS — Z96653 Presence of artificial knee joint, bilateral: Secondary | ICD-10-CM | POA: Diagnosis present

## 2024-03-11 DIAGNOSIS — Z825 Family history of asthma and other chronic lower respiratory diseases: Secondary | ICD-10-CM

## 2024-03-11 DIAGNOSIS — T380X5A Adverse effect of glucocorticoids and synthetic analogues, initial encounter: Secondary | ICD-10-CM | POA: Diagnosis present

## 2024-03-11 DIAGNOSIS — I5A Non-ischemic myocardial injury (non-traumatic): Secondary | ICD-10-CM | POA: Diagnosis present

## 2024-03-11 DIAGNOSIS — Z950 Presence of cardiac pacemaker: Secondary | ICD-10-CM

## 2024-03-11 DIAGNOSIS — Z888 Allergy status to other drugs, medicaments and biological substances status: Secondary | ICD-10-CM

## 2024-03-11 DIAGNOSIS — Z8249 Family history of ischemic heart disease and other diseases of the circulatory system: Secondary | ICD-10-CM

## 2024-03-11 DIAGNOSIS — Z7901 Long term (current) use of anticoagulants: Secondary | ICD-10-CM

## 2024-03-11 DIAGNOSIS — Z90710 Acquired absence of both cervix and uterus: Secondary | ICD-10-CM

## 2024-03-11 DIAGNOSIS — U071 COVID-19: Secondary | ICD-10-CM | POA: Diagnosis present

## 2024-03-11 DIAGNOSIS — M6281 Muscle weakness (generalized): Secondary | ICD-10-CM | POA: Diagnosis not present

## 2024-03-11 DIAGNOSIS — Z794 Long term (current) use of insulin: Secondary | ICD-10-CM

## 2024-03-11 DIAGNOSIS — Z66 Do not resuscitate: Secondary | ICD-10-CM | POA: Diagnosis present

## 2024-03-11 DIAGNOSIS — I129 Hypertensive chronic kidney disease with stage 1 through stage 4 chronic kidney disease, or unspecified chronic kidney disease: Secondary | ICD-10-CM | POA: Diagnosis not present

## 2024-03-11 DIAGNOSIS — Y92009 Unspecified place in unspecified non-institutional (private) residence as the place of occurrence of the external cause: Secondary | ICD-10-CM

## 2024-03-11 DIAGNOSIS — R41841 Cognitive communication deficit: Secondary | ICD-10-CM | POA: Diagnosis not present

## 2024-03-11 DIAGNOSIS — N189 Chronic kidney disease, unspecified: Secondary | ICD-10-CM | POA: Diagnosis not present

## 2024-03-11 DIAGNOSIS — R918 Other nonspecific abnormal finding of lung field: Secondary | ICD-10-CM | POA: Diagnosis not present

## 2024-03-11 DIAGNOSIS — R2689 Other abnormalities of gait and mobility: Secondary | ICD-10-CM | POA: Diagnosis not present

## 2024-03-11 DIAGNOSIS — I252 Old myocardial infarction: Secondary | ICD-10-CM

## 2024-03-11 DIAGNOSIS — E1165 Type 2 diabetes mellitus with hyperglycemia: Secondary | ICD-10-CM | POA: Diagnosis present

## 2024-03-11 DIAGNOSIS — I447 Left bundle-branch block, unspecified: Secondary | ICD-10-CM | POA: Diagnosis present

## 2024-03-11 DIAGNOSIS — R1312 Dysphagia, oropharyngeal phase: Secondary | ICD-10-CM | POA: Diagnosis not present

## 2024-03-11 DIAGNOSIS — R680 Hypothermia, not associated with low environmental temperature: Secondary | ICD-10-CM | POA: Diagnosis present

## 2024-03-11 LAB — CBC WITH DIFFERENTIAL/PLATELET
Abs Immature Granulocytes: 0.02 K/uL (ref 0.00–0.07)
Basophils Absolute: 0 K/uL (ref 0.0–0.1)
Basophils Relative: 0 %
Eosinophils Absolute: 0 K/uL (ref 0.0–0.5)
Eosinophils Relative: 0 %
HCT: 31.2 % — ABNORMAL LOW (ref 36.0–46.0)
Hemoglobin: 10 g/dL — ABNORMAL LOW (ref 12.0–15.0)
Immature Granulocytes: 1 %
Lymphocytes Relative: 25 %
Lymphs Abs: 1 K/uL (ref 0.7–4.0)
MCH: 31.4 pg (ref 26.0–34.0)
MCHC: 32.1 g/dL (ref 30.0–36.0)
MCV: 98.1 fL (ref 80.0–100.0)
Monocytes Absolute: 0.2 K/uL (ref 0.1–1.0)
Monocytes Relative: 6 %
Neutro Abs: 2.8 K/uL (ref 1.7–7.7)
Neutrophils Relative %: 68 %
Platelets: 152 K/uL (ref 150–400)
RBC: 3.18 MIL/uL — ABNORMAL LOW (ref 3.87–5.11)
RDW: 16.5 % — ABNORMAL HIGH (ref 11.5–15.5)
WBC: 4.1 K/uL (ref 4.0–10.5)
nRBC: 0 % (ref 0.0–0.2)

## 2024-03-11 LAB — COMPREHENSIVE METABOLIC PANEL WITH GFR
ALT: 34 U/L (ref 0–44)
AST: 44 U/L — ABNORMAL HIGH (ref 15–41)
Albumin: 3.1 g/dL — ABNORMAL LOW (ref 3.5–5.0)
Alkaline Phosphatase: 105 U/L (ref 38–126)
Anion gap: 11 (ref 5–15)
BUN: 72 mg/dL — ABNORMAL HIGH (ref 8–23)
CO2: 22 mmol/L (ref 22–32)
Calcium: 8.6 mg/dL — ABNORMAL LOW (ref 8.9–10.3)
Chloride: 105 mmol/L (ref 98–111)
Creatinine, Ser: 2.65 mg/dL — ABNORMAL HIGH (ref 0.44–1.00)
GFR, Estimated: 17 mL/min — ABNORMAL LOW (ref >60)
Glucose, Bld: 131 mg/dL — ABNORMAL HIGH (ref 70–99)
Potassium: 4.6 mmol/L (ref 3.5–5.1)
Sodium: 138 mmol/L (ref 135–145)
Total Bilirubin: 0.7 mg/dL (ref 0.0–1.2)
Total Protein: 6.1 g/dL — ABNORMAL LOW (ref 6.5–8.1)

## 2024-03-11 LAB — TROPONIN I (HIGH SENSITIVITY): Troponin I (High Sensitivity): 63 ng/L — ABNORMAL HIGH (ref <18)

## 2024-03-11 LAB — BLOOD GAS, VENOUS
Acid-base deficit: 1.2 mmol/L (ref 0.0–2.0)
Bicarbonate: 23.6 mmol/L (ref 20.0–28.0)
O2 Saturation: 83.7 %
Patient temperature: 37
pCO2, Ven: 39 mmHg — ABNORMAL LOW (ref 44–60)
pH, Ven: 7.39 (ref 7.25–7.43)
pO2, Ven: 47 mmHg — ABNORMAL HIGH (ref 32–45)

## 2024-03-11 LAB — BRAIN NATRIURETIC PEPTIDE: B Natriuretic Peptide: >4500 pg/mL — ABNORMAL HIGH (ref 0.0–100.0)

## 2024-03-11 MED ORDER — ALBUTEROL SULFATE (2.5 MG/3ML) 0.083% IN NEBU
5.0000 mg | INHALATION_SOLUTION | Freq: Once | RESPIRATORY_TRACT | Status: AC
  Administered 2024-03-11: 5 mg via RESPIRATORY_TRACT
  Filled 2024-03-11: qty 6

## 2024-03-11 MED ORDER — FUROSEMIDE 10 MG/ML IJ SOLN
40.0000 mg | Freq: Once | INTRAMUSCULAR | Status: AC
  Administered 2024-03-12: 40 mg via INTRAVENOUS
  Filled 2024-03-11: qty 4

## 2024-03-11 MED ORDER — AZITHROMYCIN 500 MG PO TABS
500.0000 mg | ORAL_TABLET | ORAL | Status: DC
  Filled 2024-03-11: qty 1

## 2024-03-11 MED ORDER — MAGNESIUM SULFATE 2 GM/50ML IV SOLN
2.0000 g | INTRAVENOUS | Status: AC
  Administered 2024-03-11: 2 g via INTRAVENOUS
  Filled 2024-03-11: qty 50

## 2024-03-11 MED ORDER — SODIUM CHLORIDE 0.9 % IV SOLN
2.0000 g | Freq: Once | INTRAVENOUS | Status: DC
  Filled 2024-03-11: qty 20

## 2024-03-11 NOTE — ED Provider Notes (Signed)
 [facility] Provider Note    Event Date/Time   First MD Initiated Contact with Patient 03/11/24 2255     (approximate)   History   Chief Complaint: Shortness of Breath   HPI  Leslie Parker is a 89 y.o. female with a history of CHF diabetes CKD atrial fibrillation COPD who comes ED complaining of shortness of breath with increased nonproductive cough for the past week.  Also has felt very fatigued, had a fall earlier today EMS came to the house but patient refused to come to the hospital at that time.  She suffered a skin tear over her right shin which was dressed by EMS.  Later on this evening she continued to feel worse and called EMS to be brought to the ED.  She denies chest pain, denies fever.  Reports loss of appetite and poor oral intake recently.        Past Medical History:  Diagnosis Date   Acute kidney injury superimposed on CKD (HCC) 11/24/2022   Asthma    Atrial fibrillation with RVR (HCC)    Cancer (HCC)    Basal Cell   CHF (congestive heart failure) (HCC)    Closed left hip fracture (HCC) 01/25/2017   Closed right hip fracture (HCC) 02/13/2023   Diabetes mellitus without complication (HCC)    Femur fracture, left (HCC) 02/03/2015   Heart murmur    Hypertension    Impetigo    Osteoarthritis    Osteopenia    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   Rapid atrial fibrillation, new onset(HCC) 06/05/2022   Vomiting    can not due to surgery   Wears dentures    full upper and lower    Current Outpatient Rx   Order #: [id number] Class: No Print   Order #: [id number] Class: Historical Med   Order #: [id number] Class: No Print   Order #: [id number] Class: Historical Med   Order #: [id number] Class: Normal   Order #: [id number] Class: Historical Med   Order #: [id number] Class: Normal   Order #: [id number] Class: Historical Med   Order #: [id number] Class: Historical Med   Order #: [id number] Class: Historical Med   [Paused] Order #: [id number] Class:  Normal   Order #: [id number] Class: Historical Med   Order #: [id number] Class: Normal   Order #: [id number] Class: Normal   Order #: [id number] Class: No Print   Order #: [id number] Class: No Print   Order #: [id number] Class: Historical Med    Past Surgical History:  Procedure Laterality Date   ABDOMINAL HYSTERECTOMY     BACK SURGERY     BLADDER SURGERY     mesh   CARPAL TUNNEL RELEASE Bilateral    CATARACT EXTRACTION Right 2017   CATARACT EXTRACTION W/PHACO Left 02/06/2016   Procedure: CATARACT EXTRACTION PHACO AND INTRAOCULAR LENS PLACEMENT (IOC) left eye;  Surgeon: Donzell Arlyce Budd, MD;  Location: [facility];  Service: Ophthalmology;  Laterality: Left;  DIABETIC LEFT Cannot arrive before 9:30   CERVICAL DISC SURGERY     CHOLECYSTECTOMY     EYE SURGERY Bilateral    Cataract Extraction with IOL   FEMUR IM NAIL Left 02/04/2015   Procedure: INTRAMEDULLARY (IM) NAIL FEMORAL;  Surgeon: Franky Cranker, MD;  Location: [facility];  Service: Orthopedics;  Laterality: Left;   HARDWARE REMOVAL Left 01/17/2017   Procedure: HARDWARE REMOVAL;  Surgeon: Cranker Franky, MD;  Location: [facility];  Service: Orthopedics;  Laterality: Left;   HERNIA REPAIR  2014   esophageal and gastric mesh. patient unable to throw up d/t  mesh   HIP ARTHROPLASTY Left 01/26/2017   Procedure: ARTHROPLASTY BIPOLAR HIP (HEMIARTHROPLASTY) removal hardware left hip;  Surgeon: Cleotilde Barrio, MD;  Location: [facility];  Service: Orthopedics;  Laterality: Left;   INTRAMEDULLARY (IM) NAIL INTERTROCHANTERIC Right 02/13/2023   Procedure: INTRAMEDULLARY (IM) NAIL INTERTROCHANTERIC;  Surgeon: Edie Norleen PARAS, MD;  Location: [facility];  Service: Orthopedics;  Laterality: Right;   JOINT REPLACEMENT Bilateral    knees   PACEMAKER IMPLANT N/A 11/15/2022   Procedure: PACEMAKER IMPLANT;  Surgeon: Ammon Blunt, MD;  Location: [facility];  Service: Cardiovascular;  Laterality: N/A;   PACEMAKER IMPLANT N/A 11/28/2022    Procedure: PACEMAKER IMPLANT;  Surgeon: Ammon Blunt, MD;  Location: [facility];  Service: Cardiovascular;  Laterality: N/A;  Lead reposition   REPLACEMENT TOTAL KNEE BILATERAL Bilateral [id number]   SHOULDER ARTHROSCOPY WITH OPEN ROTATOR CUFF REPAIR AND DISTAL CLAVICLE ACROMINECTOMY Left 10/25/2016   Procedure: SHOULDER ARTHROSCOPY WITH OPEN ROTATOR CUFF REPAIR AND DISTAL CLAVICLE ACROMINECTOMY;  Surgeon: Franky Cranker, MD;  Location: [facility];  Service: Orthopedics;  Laterality: Left;   THYROID  SURGERY     goiter removed   TOTAL HIP REVISION Left 12/02/2017   Procedure: TOTAL HIP REVISION;  Surgeon: Leora Lynwood SAUNDERS, MD;  Location: [facility];  Service: Orthopedics;  Laterality: Left;   TOTAL SHOULDER REPLACEMENT Right 2012    Physical Exam   Triage Vital Signs: ED Triage Vitals  Encounter Vitals Group     BP 03/11/24 2300 (!) 163/90     Girls Systolic BP Percentile --      Girls Diastolic BP Percentile --      Boys Systolic BP Percentile --      Boys Diastolic BP Percentile --      Pulse Rate 03/11/24 2300 (!) 59     Resp --      Temp 03/11/24 2300 97.6 F (36.4 C)     Temp Source 03/11/24 2300 Axillary     SpO2 03/11/24 2256 97 %     Weight 03/11/24 2301 154 lb 1.6 oz (69.9 kg)     Height 03/11/24 2301 5' 9 (1.753 m)     Head Circumference --      Peak Flow --      Pain Score 03/11/24 2301 3     Pain Loc --      Pain Education --      Exclude from Growth Chart --     Most recent vital signs: Vitals:   03/11/24 2333 03/12/24 0000  BP:  (!) 184/168  Pulse:  60  Resp: (!) 22 19  Temp:    SpO2:  100%    General: Awake, no distress.  CV:  Good peripheral perfusion.  Regular rate and rhythm, heart rate 60 Resp:  Tachypnea.  Diffuse expiratory wheezing and prolonged expiratory phase.  Rhonchorous breath sounds diffusely Abd:  No distention.  Soft nontender Other:  2+ pitting edema right lower extremity.  1+ pitting edema left lower extremity.  Increased  calf circumference on the right.  There is a superficial skin tear over the right prepatellar knee, hemostatic, no signs of infection   ED Results / Procedures / Treatments   Labs (all labs ordered are listed, but only abnormal results are displayed) Labs Reviewed  COMPREHENSIVE METABOLIC PANEL WITH GFR - Abnormal; Notable for the following components:      Result Value   Glucose, Bld 131 (*)    BUN 72 (*)    Creatinine, Ser 2.65 (*)  Calcium 8.6 (*)    Total Protein 6.1 (*)    Albumin  3.1 (*)    AST 44 (*)    GFR, Estimated 17 (*)    All other components within normal limits  BRAIN NATRIURETIC PEPTIDE - Abnormal; Notable for the following components:   B Natriuretic Peptide >4,500.0 (*)    All other components within normal limits  CBC WITH DIFFERENTIAL/PLATELET - Abnormal; Notable for the following components:   RBC 3.18 (*)    Hemoglobin 10.0 (*)    HCT 31.2 (*)    RDW 16.5 (*)    All other components within normal limits  BLOOD GAS, VENOUS - Abnormal; Notable for the following components:   pCO2, Ven 39 (*)    pO2, Ven 47 (*)    All other components within normal limits  TROPONIN I (HIGH SENSITIVITY) - Abnormal; Notable for the following components:   Troponin I (High Sensitivity) 63 (*)    All other components within normal limits  RESP PANEL BY RT-PCR (RSV, FLU A&B, COVID)  [id number]  PROCALCITONIN  TROPONIN I (HIGH SENSITIVITY)     EKG Interpreted by me Atrial fibrillation rate of 65.  Left axis, left bundle branch block, no acute ischemic changes.   RADIOLOGY Chest x-ray interpreted by me, shows  basilar congestion.  Radiology report reviewed   PROCEDURES:  Procedures   MEDICATIONS ORDERED IN ED: Medications  furosemide  (LASIX ) injection 40 mg (has no administration in time range)  cefTRIAXone  (ROCEPHIN ) 2 g in sodium chloride  0.9 % 100 mL IVPB (has no administration in time range)  azithromycin  (ZITHROMAX ) tablet 500 mg (has no administration in time  range)  furosemide  (LASIX ) injection 40 mg (has no administration in time range)  ipratropium-albuterol  (DUONEB) 0.5-2.5 (3) MG/3ML nebulizer solution 3 mL (has no administration in time range)  albuterol  (VENTOLIN  HFA) 108 (90 Base) MCG/ACT inhaler 2 puff (has no administration in time range)  dextromethorphan -guaiFENesin  (MUCINEX  DM) 30-600 MG per 12 hr tablet 1 tablet (has no administration in time range)  magnesium  sulfate IVPB 2 g 50 mL (2 g Intravenous New Bag/Given 03/11/24 2334)  albuterol  (PROVENTIL ) (2.5 MG/3ML) 0.083% nebulizer solution 5 mg (5 mg Nebulization Given 03/11/24 2333)     IMPRESSION / MDM / ASSESSMENT AND PLAN / ED COURSE  I reviewed the triage vital signs and the nursing notes.  DDx: Pneumonia, COPD exacerbation, pleural effusion, pulmonary edema, non-STEMI, right leg DVT, electrolyte derangement, AKI.  Doubt PE dissection pericardial effusion.  Not septic  Patient's presentation is most consistent with acute presentation with potential threat to life or bodily function.     Clinical Course as of 03/12/24 0039  Wed Mar 11, 2024  2308 Patient presents with generalized weakness , Cough shortness of breath, pronounced expiratory wheezing and prolonged expiratory phase.  Had a fall at home.  Still very symptomatic after Solu-Medrol  and 2 DuoNeb's by EMS.  Will give additional magnesium  and albuterol , check labs chest x-ray and reassess. [PS]  Thu Mar 12, 2024  0039 Case d/w hospitalist [PS]    Clinical Course User Index [PS] Viviann Pastor, MD     FINAL CLINICAL IMPRESSION(S) / ED DIAGNOSES   Final diagnoses:  COPD exacerbation (HCC)  CKD (chronic kidney disease) stage 4, GFR 15-29 ml/min (HCC)     Rx / DC Orders   ED Discharge Orders     None        Note:  This document was prepared using Dragon voice recognition software and may include  unintentional dictation errors.   Viviann Pastor, MD 03/12/24 0040

## 2024-03-11 NOTE — ED Triage Notes (Signed)
 Pt presented to ED BIBA with c/o difficulty breathing and non productive cough x 1 week. Per EMS, audible wheezing noted on their arrival, pt received 2 duonebs and 125mg  solumedrol. Expiratory wheezing noted on arrival to ED. Pt also endorses increased weakness and nausea. Hx CHF.

## 2024-03-12 ENCOUNTER — Inpatient Hospital Stay

## 2024-03-12 ENCOUNTER — Emergency Department

## 2024-03-12 DIAGNOSIS — I2489 Other forms of acute ischemic heart disease: Secondary | ICD-10-CM | POA: Diagnosis present

## 2024-03-12 DIAGNOSIS — R0989 Other specified symptoms and signs involving the circulatory and respiratory systems: Secondary | ICD-10-CM | POA: Diagnosis not present

## 2024-03-12 DIAGNOSIS — R9082 White matter disease, unspecified: Secondary | ICD-10-CM | POA: Diagnosis not present

## 2024-03-12 DIAGNOSIS — T380X5A Adverse effect of glucocorticoids and synthetic analogues, initial encounter: Secondary | ICD-10-CM | POA: Diagnosis present

## 2024-03-12 DIAGNOSIS — Z8249 Family history of ischemic heart disease and other diseases of the circulatory system: Secondary | ICD-10-CM | POA: Diagnosis not present

## 2024-03-12 DIAGNOSIS — R0602 Shortness of breath: Secondary | ICD-10-CM | POA: Diagnosis not present

## 2024-03-12 DIAGNOSIS — J90 Pleural effusion, not elsewhere classified: Secondary | ICD-10-CM | POA: Diagnosis not present

## 2024-03-12 DIAGNOSIS — I5A Non-ischemic myocardial injury (non-traumatic): Secondary | ICD-10-CM | POA: Diagnosis present

## 2024-03-12 DIAGNOSIS — E039 Hypothyroidism, unspecified: Secondary | ICD-10-CM | POA: Diagnosis present

## 2024-03-12 DIAGNOSIS — S0990XA Unspecified injury of head, initial encounter: Secondary | ICD-10-CM | POA: Diagnosis not present

## 2024-03-12 DIAGNOSIS — I10 Essential (primary) hypertension: Secondary | ICD-10-CM

## 2024-03-12 DIAGNOSIS — Z7901 Long term (current) use of anticoagulants: Secondary | ICD-10-CM | POA: Diagnosis not present

## 2024-03-12 DIAGNOSIS — I5043 Acute on chronic combined systolic (congestive) and diastolic (congestive) heart failure: Secondary | ICD-10-CM

## 2024-03-12 DIAGNOSIS — U071 COVID-19: Secondary | ICD-10-CM | POA: Diagnosis present

## 2024-03-12 DIAGNOSIS — S81811A Laceration without foreign body, right lower leg, initial encounter: Secondary | ICD-10-CM | POA: Diagnosis present

## 2024-03-12 DIAGNOSIS — N184 Chronic kidney disease, stage 4 (severe): Secondary | ICD-10-CM

## 2024-03-12 DIAGNOSIS — I480 Paroxysmal atrial fibrillation: Secondary | ICD-10-CM

## 2024-03-12 DIAGNOSIS — M8580 Other specified disorders of bone density and structure, unspecified site: Secondary | ICD-10-CM | POA: Diagnosis present

## 2024-03-12 DIAGNOSIS — W19XXXA Unspecified fall, initial encounter: Secondary | ICD-10-CM

## 2024-03-12 DIAGNOSIS — Z96653 Presence of artificial knee joint, bilateral: Secondary | ICD-10-CM | POA: Diagnosis present

## 2024-03-12 DIAGNOSIS — Z66 Do not resuscitate: Secondary | ICD-10-CM | POA: Diagnosis present

## 2024-03-12 DIAGNOSIS — I495 Sick sinus syndrome: Secondary | ICD-10-CM | POA: Diagnosis present

## 2024-03-12 DIAGNOSIS — Z888 Allergy status to other drugs, medicaments and biological substances status: Secondary | ICD-10-CM | POA: Diagnosis not present

## 2024-03-12 DIAGNOSIS — Z7189 Other specified counseling: Secondary | ICD-10-CM | POA: Diagnosis not present

## 2024-03-12 DIAGNOSIS — J441 Chronic obstructive pulmonary disease with (acute) exacerbation: Secondary | ICD-10-CM | POA: Diagnosis present

## 2024-03-12 DIAGNOSIS — R918 Other nonspecific abnormal finding of lung field: Secondary | ICD-10-CM | POA: Diagnosis not present

## 2024-03-12 DIAGNOSIS — Y92009 Unspecified place in unspecified non-institutional (private) residence as the place of occurrence of the external cause: Secondary | ICD-10-CM

## 2024-03-12 DIAGNOSIS — J690 Pneumonitis due to inhalation of food and vomit: Secondary | ICD-10-CM | POA: Diagnosis present

## 2024-03-12 DIAGNOSIS — I482 Chronic atrial fibrillation, unspecified: Secondary | ICD-10-CM | POA: Diagnosis present

## 2024-03-12 DIAGNOSIS — I130 Hypertensive heart and chronic kidney disease with heart failure and stage 1 through stage 4 chronic kidney disease, or unspecified chronic kidney disease: Secondary | ICD-10-CM | POA: Diagnosis present

## 2024-03-12 DIAGNOSIS — E785 Hyperlipidemia, unspecified: Secondary | ICD-10-CM | POA: Diagnosis present

## 2024-03-12 DIAGNOSIS — M7989 Other specified soft tissue disorders: Secondary | ICD-10-CM | POA: Diagnosis not present

## 2024-03-12 DIAGNOSIS — E1122 Type 2 diabetes mellitus with diabetic chronic kidney disease: Secondary | ICD-10-CM | POA: Diagnosis present

## 2024-03-12 DIAGNOSIS — E1165 Type 2 diabetes mellitus with hyperglycemia: Secondary | ICD-10-CM | POA: Diagnosis present

## 2024-03-12 DIAGNOSIS — T68XXXA Hypothermia, initial encounter: Secondary | ICD-10-CM | POA: Diagnosis not present

## 2024-03-12 DIAGNOSIS — R1310 Dysphagia, unspecified: Secondary | ICD-10-CM | POA: Diagnosis present

## 2024-03-12 DIAGNOSIS — Z96611 Presence of right artificial shoulder joint: Secondary | ICD-10-CM | POA: Diagnosis present

## 2024-03-12 DIAGNOSIS — Z794 Long term (current) use of insulin: Secondary | ICD-10-CM | POA: Diagnosis not present

## 2024-03-12 LAB — CBC
HCT: 32.1 % — ABNORMAL LOW (ref 36.0–46.0)
Hemoglobin: 10 g/dL — ABNORMAL LOW (ref 12.0–15.0)
MCH: 30.5 pg (ref 26.0–34.0)
MCHC: 31.2 g/dL (ref 30.0–36.0)
MCV: 97.9 fL (ref 80.0–100.0)
Platelets: 168 K/uL (ref 150–400)
RBC: 3.28 MIL/uL — ABNORMAL LOW (ref 3.87–5.11)
RDW: 16.4 % — ABNORMAL HIGH (ref 11.5–15.5)
WBC: 3.7 K/uL — ABNORMAL LOW (ref 4.0–10.5)
nRBC: 0 % (ref 0.0–0.2)

## 2024-03-12 LAB — CBG MONITORING, ED
Glucose-Capillary: 171 mg/dL — ABNORMAL HIGH (ref 70–99)
Glucose-Capillary: 170 mg/dL — ABNORMAL HIGH (ref 70–99)
Glucose-Capillary: 196 mg/dL — ABNORMAL HIGH (ref 70–99)
Glucose-Capillary: 193 mg/dL — ABNORMAL HIGH (ref 70–99)
Glucose-Capillary: 170 mg/dL — ABNORMAL HIGH (ref 70–99)

## 2024-03-12 LAB — BASIC METABOLIC PANEL WITH GFR
Anion gap: 15 (ref 5–15)
BUN: 69 mg/dL — ABNORMAL HIGH (ref 8–23)
CO2: 21 mmol/L — ABNORMAL LOW (ref 22–32)
Calcium: 8.7 mg/dL — ABNORMAL LOW (ref 8.9–10.3)
Chloride: 103 mmol/L (ref 98–111)
Creatinine, Ser: 2.75 mg/dL — ABNORMAL HIGH (ref 0.44–1.00)
GFR, Estimated: 16 mL/min — ABNORMAL LOW (ref >60)
Glucose, Bld: 176 mg/dL — ABNORMAL HIGH (ref 70–99)
Potassium: 4.7 mmol/L (ref 3.5–5.1)
Sodium: 139 mmol/L (ref 135–145)

## 2024-03-12 LAB — TROPONIN I (HIGH SENSITIVITY)
Troponin I (High Sensitivity): 60 ng/L — ABNORMAL HIGH (ref <18)
Troponin I (High Sensitivity): 43 ng/L — ABNORMAL HIGH (ref <18)
Troponin I (High Sensitivity): 50 ng/L — ABNORMAL HIGH (ref <18)

## 2024-03-12 LAB — RESP PANEL BY RT-PCR (RSV, FLU A&B, COVID)  RVPGX2
Influenza A by PCR: NEGATIVE
Influenza B by PCR: NEGATIVE
Resp Syncytial Virus by PCR: NEGATIVE
SARS Coronavirus 2 by RT PCR: POSITIVE — AB

## 2024-03-12 LAB — MAGNESIUM: Magnesium: 2.1 mg/dL (ref 1.7–2.4)

## 2024-03-12 LAB — PROCALCITONIN: Procalcitonin: <0.1 ng/mL

## 2024-03-12 LAB — GLUCOSE, CAPILLARY: Glucose-Capillary: 196 mg/dL — ABNORMAL HIGH (ref 70–99)

## 2024-03-12 MED ORDER — PANTOPRAZOLE SODIUM 40 MG PO TBEC
40.0000 mg | DELAYED_RELEASE_TABLET | Freq: Two times a day (BID) | ORAL | Status: DC
  Administered 2024-03-12 – 2024-03-24 (×24): 40 mg via ORAL
  Filled 2024-03-12 (×24): qty 1

## 2024-03-12 MED ORDER — VITAMIN C 500 MG PO TABS
250.0000 mg | ORAL_TABLET | Freq: Every day | ORAL | Status: DC
  Administered 2024-03-12 – 2024-03-24 (×13): 250 mg via ORAL
  Filled 2024-03-12 (×13): qty 1

## 2024-03-12 MED ORDER — INSULIN ASPART 100 UNIT/ML IJ SOLN
0.0000 [IU] | Freq: Every day | INTRAMUSCULAR | Status: DC
  Administered 2024-03-13: 3 [IU] via SUBCUTANEOUS
  Filled 2024-03-12: qty 1

## 2024-03-12 MED ORDER — HYDRALAZINE HCL 25 MG PO TABS
25.0000 mg | ORAL_TABLET | Freq: Three times a day (TID) | ORAL | Status: DC
  Administered 2024-03-12 – 2024-03-24 (×38): 25 mg via ORAL
  Filled 2024-03-12 (×38): qty 1

## 2024-03-12 MED ORDER — VITAMIN D3 25 MCG (1000 UNIT) PO TABS
1000.0000 [IU] | ORAL_TABLET | Freq: Every day | ORAL | Status: DC
  Administered 2024-03-12 – 2024-03-24 (×13): 1000 [IU] via ORAL
  Filled 2024-03-12 (×26): qty 1

## 2024-03-12 MED ORDER — APIXABAN 2.5 MG PO TABS
2.5000 mg | ORAL_TABLET | Freq: Two times a day (BID) | ORAL | Status: DC
  Administered 2024-03-12 – 2024-03-24 (×25): 2.5 mg via ORAL
  Filled 2024-03-12 (×26): qty 1

## 2024-03-12 MED ORDER — IPRATROPIUM-ALBUTEROL 0.5-2.5 (3) MG/3ML IN SOLN
3.0000 mL | RESPIRATORY_TRACT | Status: DC
  Administered 2024-03-12 (×4): 3 mL via RESPIRATORY_TRACT
  Filled 2024-03-12 (×4): qty 3

## 2024-03-12 MED ORDER — ACETAMINOPHEN 325 MG PO TABS
650.0000 mg | ORAL_TABLET | Freq: Four times a day (QID) | ORAL | Status: DC | PRN
  Filled 2024-03-12: qty 2

## 2024-03-12 MED ORDER — PREDNISONE 50 MG PO TABS
50.0000 mg | ORAL_TABLET | Freq: Every day | ORAL | Status: DC
  Administered 2024-03-12 – 2024-03-13 (×2): 50 mg via ORAL
  Filled 2024-03-12 (×2): qty 1

## 2024-03-12 MED ORDER — AZITHROMYCIN 500 MG PO TABS
500.0000 mg | ORAL_TABLET | Freq: Every day | ORAL | Status: AC
  Administered 2024-03-12: 500 mg via ORAL
  Filled 2024-03-12: qty 1

## 2024-03-12 MED ORDER — AMIODARONE HCL 200 MG PO TABS
200.0000 mg | ORAL_TABLET | Freq: Every day | ORAL | Status: DC
  Administered 2024-03-12 – 2024-03-24 (×13): 200 mg via ORAL
  Filled 2024-03-12 (×13): qty 1

## 2024-03-12 MED ORDER — HYDRALAZINE HCL 20 MG/ML IJ SOLN: 5.0000 mg | INTRAMUSCULAR | Status: DC | PRN

## 2024-03-12 MED ORDER — AZITHROMYCIN 250 MG PO TABS
250.0000 mg | ORAL_TABLET | Freq: Every day | ORAL | Status: AC
  Administered 2024-03-13 – 2024-03-16 (×4): 250 mg via ORAL
  Filled 2024-03-12 (×4): qty 1

## 2024-03-12 MED ORDER — CARVEDILOL 6.25 MG PO TABS
6.2500 mg | ORAL_TABLET | Freq: Two times a day (BID) | ORAL | Status: DC
  Administered 2024-03-12 – 2024-03-24 (×23): 6.25 mg via ORAL
  Filled 2024-03-12 (×24): qty 1

## 2024-03-12 MED ORDER — DM-GUAIFENESIN ER 30-600 MG PO TB12
1.0000 | ORAL_TABLET | Freq: Two times a day (BID) | ORAL | Status: DC | PRN
  Administered 2024-03-15: 1 via ORAL
  Filled 2024-03-12 (×2): qty 1

## 2024-03-12 MED ORDER — ALBUTEROL SULFATE (2.5 MG/3ML) 0.083% IN NEBU
2.5000 mg | INHALATION_SOLUTION | RESPIRATORY_TRACT | Status: DC | PRN
  Administered 2024-03-13: 2.5 mg via RESPIRATORY_TRACT
  Filled 2024-03-12: qty 3

## 2024-03-12 MED ORDER — IPRATROPIUM-ALBUTEROL 0.5-2.5 (3) MG/3ML IN SOLN: 3.0000 mL | RESPIRATORY_TRACT | Status: DC

## 2024-03-12 MED ORDER — ONDANSETRON HCL 4 MG/2ML IJ SOLN: 4.0000 mg | Freq: Three times a day (TID) | INTRAMUSCULAR | Status: DC | PRN

## 2024-03-12 MED ORDER — ADULT MULTIVITAMIN W/MINERALS CH
1.0000 | ORAL_TABLET | Freq: Every day | ORAL | Status: DC
  Administered 2024-03-12 – 2024-03-24 (×13): 1 via ORAL
  Filled 2024-03-12 (×13): qty 1

## 2024-03-12 MED ORDER — VITAMIN B-12 1000 MCG PO TABS
2000.0000 ug | ORAL_TABLET | Freq: Every day | ORAL | Status: DC
  Administered 2024-03-12 – 2024-03-22 (×11): 2000 ug via ORAL
  Filled 2024-03-12 (×11): qty 2

## 2024-03-12 MED ORDER — FUROSEMIDE 10 MG/ML IJ SOLN
40.0000 mg | Freq: Two times a day (BID) | INTRAMUSCULAR | Status: DC
  Administered 2024-03-12 – 2024-03-13 (×4): 40 mg via INTRAVENOUS
  Filled 2024-03-12 (×4): qty 4

## 2024-03-12 MED ORDER — ALBUTEROL SULFATE HFA 108 (90 BASE) MCG/ACT IN AERS: 2.0000 | INHALATION_SPRAY | RESPIRATORY_TRACT | Status: DC | PRN

## 2024-03-12 MED ORDER — INSULIN ASPART 100 UNIT/ML IJ SOLN
0.0000 [IU] | Freq: Three times a day (TID) | INTRAMUSCULAR | Status: DC
  Administered 2024-03-12 – 2024-03-14 (×7): 2 [IU] via SUBCUTANEOUS
  Administered 2024-03-14: 3 [IU] via SUBCUTANEOUS
  Administered 2024-03-15 (×2): 1 [IU] via SUBCUTANEOUS
  Administered 2024-03-15 – 2024-03-16 (×3): 2 [IU] via SUBCUTANEOUS
  Administered 2024-03-17 – 2024-03-18 (×3): 1 [IU] via SUBCUTANEOUS
  Administered 2024-03-18: 2 [IU] via SUBCUTANEOUS
  Administered 2024-03-19 – 2024-03-21 (×5): 1 [IU] via SUBCUTANEOUS
  Administered 2024-03-21 – 2024-03-22 (×3): 2 [IU] via SUBCUTANEOUS
  Administered 2024-03-23 – 2024-03-24 (×2): 1 [IU] via SUBCUTANEOUS
  Administered 2024-03-24: 2 [IU] via SUBCUTANEOUS
  Filled 2024-03-12 (×9): qty 1
  Filled 2024-03-12 (×2): qty 2
  Filled 2024-03-12 (×8): qty 1
  Filled 2024-03-12: qty 2
  Filled 2024-03-12 (×10): qty 1

## 2024-03-12 NOTE — H&P (Signed)
 History and Physical    Leslie Parker FMW:[id number] DOB: 03/12/1933 DOA: 03/11/2024  Referring MD/NP/PA:   PCP: Sherial Bail, MD   Patient coming from:  The patient is coming from home.     Chief Complaint: SOB   HPI: Leslie Parker is a 89 y.o. female with medical history significant of  sCHF with EF 25-30%, SSS (s/p of PPM), A fib on Eliquis , HTN, HLD, DM, COPD, CKD-IV, osteopenia, ileus, who presents with SOB.   Pt has had multiple admission recently.  Most recent admission was from 8/5 - 8/10 due to CHF exacerbation.  Patient states in the past several days, her SOB has been progressively worsening.  Patient has dry cough, no chest pain, fever or chills.  Patient had loose stool yesterday which has resolved.  No nausea vomiting, diarrhea or abdominal pain currently.  Denies symptoms of UTI.  Patient has generalized weakness.  She states that she fell accidentally this morning, denies LOC.  She has small skin tear over right shin area.  Patient came in with 2 L oxygen with 100% of saturation (patient does not use oxygen at baseline). Per report, patient has wheezing and was given 125 mg of Solu-Medrol  by EMS.    Data reviewed independently and ED Course: pt was found to have BNP> 4500, WBC 4.1, stable renal function, troponin 63.  Temperature normal, blood pressure 184/168, heart rate 59, RR 22.  VBG with pH 7.39, CO2 39, O2 47.  Right lower extremity venous Doppler negative for DVT.  Patient is admitted to telemetry bed as inpatient.  Chest x-ray: 1. No significant change from 15 days ago. 2. Stable cardiomegaly and mild central vascular prominence. 3. Small pleural effusions with overlying opacities which could be due to atelectasis or consolidation, greater on the right.   EKG: I have personally reviewed.  Paced rhythm.   Review of Systems:   General: no fevers, chills, no body weight gain, has poor appetite, has fatigue HEENT: no blurry vision, hearing changes or sore  throat Respiratory: has dyspnea, coughing, wheezing CV: no chest pain, no palpitations GI: no nausea, vomiting, abdominal pain, diarrhea, constipation GU: no dysuria, burning on urination, increased urinary frequency, hematuria  Ext: has leg edema Neuro: no unilateral weakness, numbness, or tingling, no vision change or hearing loss. Has fall. Skin: no rash. Has a small skin tear in right shin area MSK: No muscle spasm, no deformity, no limitation of range of movement in spin Heme: No easy bruising.  Travel history: No recent long distant travel.   Allergy:  Allergies  Allergen Reactions   Baclofen Other (See Comments)    Elevated Cr   Simvastatin Other (See Comments)    Pt denies  Elevated LFTs with simva 40mg , stopped and resolved (see MD note 11/10/13)    Past Medical History:  Diagnosis Date   Acute kidney injury superimposed on CKD (HCC) 11/24/2022   Asthma    Atrial fibrillation with RVR (HCC)    Cancer (HCC)    Basal Cell   CHF (congestive heart failure) (HCC)    Closed left hip fracture (HCC) 01/25/2017   Closed right hip fracture (HCC) 02/13/2023   Diabetes mellitus without complication (HCC)    Femur fracture, left (HCC) 02/03/2015   Heart murmur    Hypertension    Impetigo    Osteoarthritis    Osteopenia    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   Rapid atrial fibrillation, new onset(HCC) 06/05/2022   Vomiting    can  not due to surgery   Wears dentures    full upper and lower    Past Surgical History:  Procedure Laterality Date   ABDOMINAL HYSTERECTOMY     BACK SURGERY     BLADDER SURGERY     mesh   CARPAL TUNNEL RELEASE Bilateral    CATARACT EXTRACTION Right 2017   CATARACT EXTRACTION W/PHACO Left 02/06/2016   Procedure: CATARACT EXTRACTION PHACO AND INTRAOCULAR LENS PLACEMENT (IOC) left eye;  Surgeon: Donzell Arlyce Budd, MD;  Location: [facility];  Service: Ophthalmology;  Laterality: Left;  DIABETIC LEFT Cannot arrive before 9:30    CERVICAL DISC SURGERY     CHOLECYSTECTOMY     EYE SURGERY Bilateral    Cataract Extraction with IOL   FEMUR IM NAIL Left 02/04/2015   Procedure: INTRAMEDULLARY (IM) NAIL FEMORAL;  Surgeon: Franky Cranker, MD;  Location: [facility];  Service: Orthopedics;  Laterality: Left;   HARDWARE REMOVAL Left 01/17/2017   Procedure: HARDWARE REMOVAL;  Surgeon: Cranker Franky, MD;  Location: [facility];  Service: Orthopedics;  Laterality: Left;   HERNIA REPAIR  2014   esophageal and gastric mesh. patient unable to throw up d/t mesh   HIP ARTHROPLASTY Left 01/26/2017   Procedure: ARTHROPLASTY BIPOLAR HIP (HEMIARTHROPLASTY) removal hardware left hip;  Surgeon: Cleotilde Barrio, MD;  Location: [facility];  Service: Orthopedics;  Laterality: Left;   INTRAMEDULLARY (IM) NAIL INTERTROCHANTERIC Right 02/13/2023   Procedure: INTRAMEDULLARY (IM) NAIL INTERTROCHANTERIC;  Surgeon: Edie Norleen PARAS, MD;  Location: [facility];  Service: Orthopedics;  Laterality: Right;   JOINT REPLACEMENT Bilateral    knees   PACEMAKER IMPLANT N/A 11/15/2022   Procedure: PACEMAKER IMPLANT;  Surgeon: Ammon Blunt, MD;  Location: [facility];  Service: Cardiovascular;  Laterality: N/A;   PACEMAKER IMPLANT N/A 11/28/2022   Procedure: PACEMAKER IMPLANT;  Surgeon: Ammon Blunt, MD;  Location: [facility];  Service: Cardiovascular;  Laterality: N/A;  Lead reposition   REPLACEMENT TOTAL KNEE BILATERAL Bilateral [id number]   SHOULDER ARTHROSCOPY WITH OPEN ROTATOR CUFF REPAIR AND DISTAL CLAVICLE ACROMINECTOMY Left 10/25/2016   Procedure: SHOULDER ARTHROSCOPY WITH OPEN ROTATOR CUFF REPAIR AND DISTAL CLAVICLE ACROMINECTOMY;  Surgeon: Franky Cranker, MD;  Location: [facility];  Service: Orthopedics;  Laterality: Left;   THYROID  SURGERY     goiter removed   TOTAL HIP REVISION Left 12/02/2017   Procedure: TOTAL HIP REVISION;  Surgeon: Leora Lynwood SAUNDERS, MD;  Location: [facility];  Service: Orthopedics;  Laterality: Left;   TOTAL  SHOULDER REPLACEMENT Right 2012    Social History:  reports that she has never smoked. She has never used smokeless tobacco. She reports that she does not drink alcohol and does not use drugs.  Family History:  Family History  Problem Relation Age of Onset   Heart failure Mother    Hypertension Mother    Emphysema Father      Prior to Admission medications   Medication Sig Start Date End Date Taking? Authorizing Provider  acetaminophen  (TYLENOL ) 500 MG tablet Take 2 tablets (1,000 mg total) by mouth 3 (three) times daily. 11/29/23   Maree Hue, MD  albuterol  (VENTOLIN  HFA) 108 (90 Base) MCG/ACT inhaler Inhale 2 puffs into the lungs every 6 (six) hours as needed. 08/04/19   [provider]  alum & mag hydroxide-simeth (MAALOX/MYLANTA) 200-200-20 MG/5ML suspension Take 30 mLs by mouth every 6 (six) hours as needed for indigestion or heartburn. 11/29/23   Maree Hue, MD  amiodarone  (PACERONE ) 200 MG tablet Take 1 tablet  by mouth daily. 08/26/23   [provider]  apixaban  (ELIQUIS ) 2.5 MG TABS tablet Take 1 tablet (2.5 mg total) by mouth 2 (two) times daily. 06/07/22   Jhonny Calvin NOVAK, MD  ascorbic acid  (VITAMIN C ) 250 MG tablet Take 250 mg by mouth daily.    [provider]  carvedilol  (COREG ) 6.25 MG tablet Take 1 tablet (6.25 mg total) by mouth 2 (two) times daily with a meal. 01/23/24 02/25/24  Maree Hue, MD  Cholecalciferol  (VITAMIN D -1000 MAX ST) 25 MCG (1000 UT) tablet Take 1,000 Units by mouth daily.    [provider]  Cyanocobalamin  (B-12) 2500 MCG TABS Take 2,500 mcg by mouth daily.    [provider]  hydrALAZINE  (APRESOLINE ) 25 MG tablet 25 mg 3 (three) times daily.    [provider]  losartan  (COZAAR ) 25 MG tablet Take 0.5 tablets (12.5 mg total) by mouth daily. 01/23/24 03/23/24  Maree Hue, MD  Multiple Vitamin (MULTIVITAMIN WITH MINERALS) TABS tablet Take 1 tablet by mouth daily. One-A-Day Women's Vitamin    [provider]  ondansetron  (ZOFRAN ) 4 MG tablet Take 1 tablet (4 mg total) by mouth every 6 (six) hours as needed for nausea. 11/17/23   Josette Ade, MD  pantoprazole  (PROTONIX ) 40 MG tablet Take 1 tablet (40 mg total) by mouth 2 (two) times daily. 03/01/24 03/31/24  Dezii, Alexandra, DO  polyethylene glycol (MIRALAX  / GLYCOLAX ) 17 g packet Take 17 g by mouth daily as needed. 11/29/23   Maree Hue, MD  senna-docusate (SENOKOT-S) 8.6-50 MG tablet Take 2 tablets by mouth at bedtime as needed for mild constipation. 11/29/23   Maree Hue, MD  TRELEGY ELLIPTA 100-62.5-25 MCG/ACT AEPB Inhale 1 puff into the lungs daily. Patient not taking: Reported on 02/25/2024 08/03/22   [provider]    Physical Exam: Vitals:   03/11/24 2301 03/11/24 2302 03/11/24 2333 03/12/24 0000  BP:    (!) 184/168  Pulse:    60  Resp:   (!) 22 19  Temp:      TempSrc:      SpO2:    100%  Weight: 69.9 kg 73.1 kg    Height: 5' 9 (1.753 m)      General: Not in acute distress HEENT:       Eyes: PERRL, EOMI, no jaundice       ENT: No discharge from the ears and nose, no pharynx injection, no tonsillar enlargement.        Neck: positive JVD, no bruit, no mass felt. Heme: No neck lymph node enlargement. Cardiac: S1/S2, RRR, No murmurs, No gallops or rubs. Respiratory: has fine crackles bilaterally, has minimal wheezing bilaterally GI: Soft, nondistended, nontender, no rebound pain, no organomegaly, BS present. GU: No hematuria Ext: has 1+ pitting leg edema bilaterally. 1+DP/PT pulse bilaterally. Musculoskeletal: No joint deformities, No joint redness or warmth, no limitation of ROM in spin. Skin: Has a small skin tear in right shin area Neuro: Alert, oriented X3, cranial nerves II-XII grossly intact, moves all extremities normally Psych: Patient is not psychotic, no suicidal or hemocidal ideation.  Labs on Admission: I have personally reviewed following labs and imaging studies  CBC: Recent Labs  Lab  03/11/24 2304  WBC 4.1  NEUTROABS 2.8  HGB 10.0*  HCT 31.2*  MCV 98.1  PLT 152   Basic Metabolic Panel: Recent Labs  Lab 03/05/24 1610 03/11/24 2304  NA 136 138  K 4.2 4.6  CL 104 105  CO2 22 22  GLUCOSE 101* 131*  BUN 83* 72*  CREATININE 3.11* 2.65*  CALCIUM 8.5* 8.6*   GFR: Estimated Creatinine Clearance: 15 mL/min (A) (by C-G formula based on SCr of 2.65 mg/dL (H)). Liver Function Tests: Recent Labs  Lab 03/11/24 2304  AST 44*  ALT 34  ALKPHOS 105  BILITOT 0.7  PROT 6.1*  ALBUMIN  3.1*   No results for input(s): LIPASE, AMYLASE in the last 168 hours. No results for input(s): AMMONIA in the last 168 hours. Coagulation Profile: No results for input(s): INR, PROTIME in the last 168 hours. Cardiac Enzymes: No results for input(s): CKTOTAL, CKMB, CKMBINDEX, TROPONINI in the last 168 hours. BNP (last 3 results) No results for input(s): PROBNP in the last 8760 hours. HbA1C: No results for input(s): HGBA1C in the last 72 hours. CBG: No results for input(s): GLUCAP in the last 168 hours. Lipid Profile: No results for input(s): CHOL, HDL, LDLCALC, TRIG, CHOLHDL, LDLDIRECT in the last 72 hours. Thyroid  Function Tests: No results for input(s): TSH, T4TOTAL, FREET4, T3FREE, THYROIDAB in the last 72 hours. Anemia Panel: No results for input(s): VITAMINB12, FOLATE, FERRITIN, TIBC, IRON, RETICCTPCT in the last 72 hours. Urine analysis:    Component Value Date/Time   COLORURINE YELLOW (A) 11/05/2023 1505   APPEARANCEUR CLEAR (A) 11/05/2023 1505   APPEARANCEUR Clear 02/19/2013 0254   LABSPEC 1.012 11/05/2023 1505   LABSPEC 1.006 02/19/2013 0254   PHURINE 6.0 11/05/2023 1505   GLUCOSEU NEGATIVE 11/05/2023 1505   GLUCOSEU Negative 02/19/2013 0254   HGBUR NEGATIVE 11/05/2023 1505   BILIRUBINUR NEGATIVE 11/05/2023 1505   BILIRUBINUR Negative 02/19/2013 0254   KETONESUR NEGATIVE 11/05/2023 1505   PROTEINUR 100  (A) 11/05/2023 1505   NITRITE NEGATIVE 11/05/2023 1505   LEUKOCYTESUR NEGATIVE 11/05/2023 1505   LEUKOCYTESUR 1+ 02/19/2013 0254   Sepsis Labs: @LABRCNTIP (procalcitonin:4,lacticidven:4) )No results found for this or any previous visit (from the past 240 hours).   Radiological Exams on Admission:   Assessment/Plan Principal Problem:   Acute on chronic combined systolic and diastolic congestive heart failure (HCC) Active Problems:   COPD with acute exacerbation (HCC)   Myocardial injury   Paroxysmal atrial fibrillation (HCC)   Type II diabetes mellitus with renal manifestations (HCC)   CKD (chronic kidney disease) stage 4, GFR 15-29 ml/min (HCC)   HTN (hypertension), malignant   Essential hypertension   Fall at home, initial encounter   Assessment and Plan:   Acute on chronic combined systolic and diastolic congestive heart failure [facility]): Patient has SOB, 1+ leg edema, significantly elevated BNP> 4500, clinically consistent with CHF exacerbation.  2D echo on 08/09/2023 showed EF 25-30%.    -Admit to tele bed as inpt -IV lasix  40 mg bid -Daily weights -strict I/O's -Low salt diet -Fluid restriction -As needed bronchodilators for shortness of breath  COPD with acute exacerbation [facility]): Patient also has wheezing, indicating COPD exacerbation.  No fever or leukocytosis, clinically does not have pneumonia. -Bronchodilators and prn Mucinex  - pt was given 125 mg of Solu-Medrol  by EMS --> will give prednisone  50 mg daily - Z-pak -Incentive spirometry -Check RSEP panel -Follow up sputum culture   Myocardial injury: Troponin  63. No CP. Likely demand ischemia. - Patient is on Eliquis , will not give aspirin  - Patient is allergic to statin - trend trop - on coreg    HTN (hypertension) -IV hydralazine  as needed -Coreg , oral hydralazine    Chronic a-fib (HCC): Heart rate 50-60s in ED -Eliquis  -Coreg , amodarone   CKD (chronic kidney disease) stage 4, GFR 15-29  ml/min [facility]):  Renal function stable.  Baseline creatinine 3.11 03/05/24.  Her creatinine is 2.65, BUN 72, GFR 17. -For now with BMP   Type II diabetes mellitus with renal manifestations (HCC): Recent A1c 7.0.  -Sliding scale insulin    Fall at home, initial encounter -consult TOC for HH need or rehab/SNF placement -PT/OT - wound care per RN for skin tear in right shin area -f/u CT-head     DVT ppx: on Eliquis   Code Status: DNR (I discussed with patient and explained the meaning of CODE STATUS. Patient wants to be DNR)  Family Communication:     not done, no family member is at bed side.     Disposition Plan:  Anticipate discharge back to previous environment  Consults called:  none  Admission status and Level of care: Telemetry Cardiac:  as inpt        Dispo: The patient is from: Home              Anticipated d/c is to: Home              Anticipated d/c date is: 2 days              Patient currently is not medically stable to d/c.    Severity of Illness:  The appropriate patient status for this patient is INPATIENT. Inpatient status is judged to be reasonable and necessary in order to provide the required intensity of service to ensure the patient's safety. The patient's presenting symptoms, physical exam findings, and initial radiographic and laboratory data in the context of their chronic comorbidities is felt to place them at high risk for further clinical deterioration. Furthermore, it is not anticipated that the patient will be medically stable for discharge from the hospital within 2 midnights of admission.   * I certify that at the point of admission it is my clinical judgment that the patient will require inpatient hospital care spanning beyond 2 midnights from the point of admission due to high intensity of service, high risk for further deterioration and high frequency of surveillance required.*       Date of Service 03/12/2024    Caleb Exon [facility]   If  7PM-7AM, please contact night-coverage [email or web address] 03/12/2024, 1:10 AM

## 2024-03-12 NOTE — Plan of Care (Addendum)
 PMT consult noted on this patient. She is familiar to PMT team from previous admissions. Historically during our discussions, she has desired DNR/DNI and treating the treating the treatable.   Our team last spoke with her on 8/7, and at that time, she stated she realized she is needing to come to the hospital more frequently, and desires to continue to come to the hospital as needed. She requested that moving forward, she wants to speak directly with the attending and specialist services that are consulted during the current admission. She stated that everyone keeps discussing the same information, and speaking with PMT does not prevent her from having these conversations with other providers.   Given patient's wishes, PMT will cancel consult. If patient would like to speak with PMT, please reconsult as we would be happy to see her if she desires to do so.  Communicated with attending.

## 2024-03-12 NOTE — TOC CM/SW Note (Signed)
..  Transition of Care [facility] - Inpatient Brief Assessment   Patient Details  Name: Leslie Parker MRN: [id number] Date of Birth: Jul 16, 1933  Transition of [facility]) CM/SW Contact:    [name], LCSW Phone Number: 03/12/2024, 6:10 PM   Clinical Narrative:  SW met with pt at bedside.  Per pt report: Pt stated that she slip out the chair and wasn't able to get up so she called the fire dept.  Pt stated that she lives alone however her daughter [name] is local [phone]).  Pt stated that she has [facility] and that her other daughterGLENWOOD Marshal will be staying with her for a few weeks as well.  PCP: Dr. Sherial and Pharmacy: [facility].  DME: Lift chair, 4 wheel walker, bedside commode.  Pt stated that daughter will be providing transportation once discharged.  Wears pullups.  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  Pt stated that she divorced in 1970 and that husband was a Cytogeneticist. SW called daughter Marshal.  [name] stated that she will be coming to assist with pt in a few weeks and that her sister [name] is local and will assist pt as well. SW messaged [facility].  Waiting on response  Transition of Care Asessment:

## 2024-03-12 NOTE — Hospital Course (Addendum)
 Leslie Parker is a 89 y.o. female with medical history significant of  sCHF with EF 25-30%, SSS (s/p of PPM), A fib on Eliquis , HTN, HLD, DM, COPD, CKD-IV, osteopenia, ileus, who presents with SOB.  Chest x-ray showed cardiomegaly with vascular congestion, small amount of bilateral pleural effusion.  BNP more than 4000.  She was diagnosed with acute on chronic systolic congestive heart failure, was started on IV Lasix .  She is also placed on steroids for bronchospasm.  However, her COVID test came back positive. Patient has received multiple days of IV Lasix , also started on steroids, antibiotics.  Condition finally improving.  Medically stable for discharge.

## 2024-03-12 NOTE — Progress Notes (Signed)
 Heart Failure Navigator Progress Note  Patient currently goes to both [facility], FNP and [facility]. Patient has a follow-up appointment already scheduled at the [facility] with [facility] on 04/09/24 @ 3:30 PM. Will continue to follow patient this admission as she is +Covid at this time.      Navigator available for reassessment of patient.   Charmaine Pines, RN, BSN [facility] Heart Failure Navigator Secure Chat Only

## 2024-03-12 NOTE — Progress Notes (Signed)
 Progress Note   Patient: Leslie Parker FMW:[id number] DOB: [birth year] DOA: 03/11/2024     0 DOS: the patient was seen and examined on 03/12/2024   Brief hospital course: MATTYE VERDONE is a 89 y.o. female with medical history significant of  sCHF with EF 25-30%, SSS (s/p of PPM), A fib on Eliquis , HTN, HLD, DM, COPD, CKD-IV, osteopenia, ileus, who presents with SOB.  Chest x-ray showed cardiomegaly with vascular congestion, small amount of bilateral pleural effusion.  BNP more than 4000.  She was diagnosed with acute on chronic systolic congestive heart failure, was started on IV Lasix .  She is also placed on steroids for bronchospasm.  However, her COVID test came back positive.   Principal Problem:   Acute on chronic combined systolic and diastolic congestive heart failure (HCC) Active Problems:   COPD with acute exacerbation (HCC)   Myocardial injury   Paroxysmal atrial fibrillation (HCC)   Type II diabetes mellitus with renal manifestations (HCC)   CKD (chronic kidney disease) stage 4, GFR 15-29 ml/min (HCC)   HTN (hypertension), malignant   Essential hypertension   Fall at home, initial encounter   Assessment and Plan: Acute on chronic combined systolic and diastolic congestive heart failure (HCC) Myocardial injury secondary to congestive heart failure. : Patient has SOB, 1+ leg edema, significantly elevated BNP> 4500, clinically consistent with CHF exacerbation.  2D echo on 08/09/2023 showed EF 25-30%.  Treated with IV Lasix , renal function still stable.  Will continue Lasix , monitor renal function and electrolytes.   COPD with acute exacerbation (HCC) COVID-19 infection.:  Patient has significant wheezing, this is caused by COVID infection.  Continue steroids.  Continue bronchodilator.    HTN (hypertension) -IV hydralazine  as needed -Coreg , oral hydralazine    Chronic a-fib (HCC): Heart rate 50-60s in ED -Eliquis  -Coreg , amodarone   CKD (chronic kidney disease) stage 4,  GFR 15-29 ml/min [facility]): Renal function stable.  Baseline creatinine 3.11 03/05/24.   Continue monitor renal function after Lasix .   Type II diabetes mellitus with renal manifestations [facility]): Recent A1c 7.0.  -Sliding scale insulin    Fall at home, initial encounter -consult TOC for [facility] need or rehab/SNF placement -PT/OT - wound care per RN for skin tear in right shin area -f/u CT-head  Poor prognosis. Patient has severe LV dysfunction, advanced age, CKD stage IV.  And not multiple other medical problems, her long-term prognosis is poor.  I have asked palliative care to see her, but she refused to see them.     Subjective:  Patient feels better today, still has significant cough which is nonproductive.  Short of breath with exertion.  No chest pain.  Physical Exam: Vitals:   03/12/24 0447 03/12/24 0600 03/12/24 0828 03/12/24 1130  BP: (!) 165/85 (!) 173/82 (!) 147/90 (!) 151/81  Pulse: 63 (!) 56 65 61  Resp: 20 (!) 23 20 (!) 21  Temp:   97.7 F (36.5 C)   TempSrc:   Oral   SpO2: 93% 96% 98% 98%  Weight:      Height:       General exam: Appears calm and comfortable  Respiratory system: wheezes. Respiratory effort normal. Cardiovascular system: Irregular. No JVD, murmurs, rubs, gallops or clicks. Gastrointestinal system: Abdomen is nondistended, soft and nontender. No organomegaly or masses felt. Normal bowel sounds heard. Central nervous system: Alert and oriented x3. No focal neurological deficits. Extremities: 1+ leg edema Skin: No rashes, lesions or ulcers Psychiatry: Judgement and insight appear normal. Mood & affect appropriate.  Data Reviewed:  Reviewed chest x-ray, lab results.  Family Communication: granddaughter updated over the phone.   Disposition: Status is: Inpatient Remains inpatient appropriate because: Severity of disease, IV treatment     Time spent: no charge minutes  Author: Murvin Mana, MD 03/12/2024 11:38 AM  For on call review  [email or web address].

## 2024-03-12 NOTE — Progress Notes (Signed)
   03/12/24 1130  Spiritual Encounters  Type of Visit Initial  Care provided to: Patient  Referral source Chaplain team  Reason for visit Routine spiritual support  Interventions  Spiritual Care Interventions Made Established relationship of care and support;Compassionate presence;Reflective listening  Intervention Outcomes  Outcomes Connection to spiritual care   Chaplain spoke with pt about the possibility of hospice care and pt said, Two doctors have told me I do not need hospice. Chaplain then talked about her family and told her the chaplain team is available should she need to talk further.

## 2024-03-12 NOTE — Evaluation (Signed)
 Occupational Therapy Evaluation Patient Details Name: Leslie Parker MRN: [id number] DOB: 15-Jun-1933 Today's Date: 03/12/2024   History of Present Illness   89 y.o. female with medical history significant of  sCHF with EF 25-30%, SSS (s/p of PPM), A fib on Eliquis , HTN, HLD, DM, COPD, CKD-IV, osteopenia, ileus, who presents with SOB. Pt now testing positive for covid.     Clinical Impressions Patient presenting with decreased Ind in self care,balance, functional mobility/transfer, endurance, and safety awareness. Patient reports being Mod I at home with use of rollator and wheelchair in community. Pt has had recent fall at home. Min A in room for mobility and and fatigues quickly this session. Patient will benefit from acute OT to increase overall independence in the areas of ADLs, functional mobility, and safety awareness in order to safely discharge.     If plan is discharge home, recommend the following:   A little help with walking and/or transfers;A little help with bathing/dressing/bathroom;Assist for transportation;Help with stairs or ramp for entrance;Assistance with cooking/housework     Functional Status Assessment   Patient has had a recent decline in their functional status and demonstrates the ability to make significant improvements in function in a reasonable and predictable amount of time.     Equipment Recommendations   None recommended by OT      Precautions/Restrictions   Precautions Precautions: Fall Recall of Precautions/Restrictions: Intact     Mobility Bed Mobility Overal bed mobility: Needs Assistance Bed Mobility: Supine to Sit, Sit to Supine     Supine to sit: Min assist Sit to supine: Min assist        Transfers Overall transfer level: Needs assistance Equipment used: 1 person hand held assist Transfers: Sit to/from Stand, Bed to chair/wheelchair/BSC Sit to Stand: Min assist     Step pivot transfers: Min assist             Balance Overall balance assessment: Needs assistance Sitting-balance support: No upper extremity supported Sitting balance-[name]: Good     Standing balance support: Single extremity supported Standing balance-[name]: Fair                             ADL either performed or assessed with clinical judgement   ADL Overall ADL's : Needs assistance/impaired                         Toilet Transfer: Minimal assistance Toilet Transfer Details (indicate cue type and reason): simulated                 Vision Patient Visual Report: No change from baseline              Pertinent Vitals/Pain Pain Assessment Pain Assessment: Faces Faces Pain Scale: Hurts little more Pain Location: R LE from fall Pain Descriptors / Indicators: Discomfort, Sore Pain Intervention(s): Monitored during session, Repositioned     Extremity/Trunk Assessment Upper Extremity Assessment Upper Extremity Assessment: Generalized weakness   Lower Extremity Assessment Lower Extremity Assessment: Generalized weakness       Communication Communication Communication: Impaired Factors Affecting Communication: Hearing impaired   Cognition Arousal: Alert Behavior During Therapy: WFL for tasks assessed/performed Cognition: No apparent impairments                               Following commands: Intact  Cueing  General Comments   Cueing Techniques: Verbal cues              Home Living Family/patient expects to be discharged to:: Private residence Living Arrangements: Alone   Type of Home: Apartment Home Access: Level entry     Home Layout: One level     Bathroom Shower/Tub: Producer, television/film/video: Handicapped height Bathroom Accessibility: Yes How Accessible: Accessible via walker Home Equipment: Rollator (4 wheels);Cane - single point;Grab bars - tub/shower;Grab bars - toilet;Lift chair;Transport chair;Tub bench;Rolling  Walker (2 wheels);Shower seat;Other (comment)   Additional Comments: Pt lives in senior living apartment. She sleeps sometimes in lift chair and has leg lifter.      Prior Functioning/Environment Prior Level of Function : Needs assist;History of Falls (last six months)             Mobility Comments: mod I with rollator in the home, used w/c outside of the home ADLs Comments: Pt reports Ind with self care. She uses public transportation rides in wheelchair to pick up groceries. She endorses daughter coming next week to help?    OT Problem List: Decreased strength;Decreased activity tolerance;Impaired balance (sitting and/or standing)   OT Treatment/Interventions: Self-care/ADL training;Therapeutic exercise;Patient/family education;Energy conservation;Therapeutic activities;DME and/or AE instruction      OT Goals(Current goals can be found in the care plan section)   Acute Rehab OT Goals Patient Stated Goal: to go home OT Goal Formulation: With patient Time For Goal Achievement: 03/25/24 Potential to Achieve Goals: Fair ADL Goals Pt Will Perform Grooming: with modified independence;standing Pt Will Perform Lower Body Dressing: with modified independence;sit to/from stand Pt Will Transfer to Toilet: with modified independence;ambulating Pt Will Perform Toileting - Clothing Manipulation and hygiene: with modified independence;sit to/from stand   OT Frequency:  Min 2X/week       AM-PAC OT 6 Clicks Daily Activity     Outcome Measure Help from another person eating meals?: None Help from another person taking care of personal grooming?: None Help from another person toileting, which includes using toliet, bedpan, or urinal?: A Little Help from another person bathing (including washing, rinsing, drying)?: A Little Help from another person to put on and taking off regular upper body clothing?: None Help from another person to put on and taking off regular lower body clothing?:  A Little 6 Click Score: 21   End of Session Equipment Utilized During Treatment: Rolling walker (2 wheels) Nurse Communication: Mobility status  Activity Tolerance: Patient tolerated treatment well Patient left: in bed;with call bell/phone within reach;with bed alarm set  OT Visit Diagnosis: Other abnormalities of gait and mobility (R26.89);Muscle weakness (generalized) (M62.81)                Time: 9162-9146 OT Time Calculation (min): 16 min Charges:  OT General Charges $OT Visit: 1 Visit OT Evaluation $OT Eval Low Complexity: 1 Low OT Treatments $Therapeutic Activity: 8-22 mins  Izetta Claude, MS, OTR/L , CBIS ascom [phone]  03/12/24, 9:36 AM

## 2024-03-12 NOTE — Evaluation (Signed)
 Physical Therapy Evaluation Patient Details Name: Leslie Parker MRN: [id number] DOB: 05-Sep-1932 Today's Date: 03/12/2024  History of Present Illness  89 y.o. female with medical history significant of  sCHF with EF 25-30%, SSS (s/p of PPM), A fib on Eliquis , HTN, HLD, DM, COPD, CKD-IV, osteopenia, ileus, who presents with SOB. Pt now testing positive for covid.  Clinical Impression  Pt received in supine and is agreeable for PT eval. At baseline, pt is mod I for short distance HH ambulation with AD, mWC mobility in community, and ADL's; has assistance for IADL's and does not wear O2 at baseline. Pt with hx of multiple falls.  Pt presents with +3-4 pitting edema of BLE, increased pain levels, decreased standing balance, decreased activity tolerance, impaired skin integrity, and gait deviations, resulting in impaired functional mobility from baseline. Due to deficits, pt required supervision-modA for bed mobility, CGA for transfers with RW, and CGA-minA to ambulate 82ft with RW. Demonstrates decreased obstacle negotiation, RW proximity, foot clearance, and step length bil which increases her risk of falls and injury.   Deficits limit the pt's ability to safely and independently perform ADL's, transfer, and ambulate. Pt will benefit from acute skilled PT services to address deficits for return to baseline function. Pt will benefit from post acute therapy services to address deficits for return to baseline function.  Encourage OOB mobility with nursing and mobility tech for meals and toileting for continued progress towards goals and maintenance of IND with functional mobility while hospitalized.         If plan is discharge home, recommend the following: Assistance with cooking/housework;Assist for transportation;A little help with bathing/dressing/bathroom;A little help with walking and/or transfers;Supervision due to cognitive status   Can travel by private vehicle   Yes    Equipment  Recommendations None recommended by PT     Functional Status Assessment Patient has had a recent decline in their functional status and demonstrates the ability to make significant improvements in function in a reasonable and predictable amount of time.     Precautions / Restrictions Precautions Precautions: Fall Recall of Precautions/Restrictions: Intact Restrictions Other Position/Activity Restrictions: fluid restriction ( ); SpO2 >/= 92%      Mobility  Bed Mobility         Supine to sit: Supervision, Min assist Sit to supine: Mod assist   General bed mobility comments: initiates sup>sit with supervision, but ultimately requires minA for trunk facilitation to sit EOB. modA for BLE facilitation onto bed for sit>sup transfer.    Transfers   Equipment used: Rolling walker (2 wheels)   Sit to Stand: Contact guard assist   Step pivot transfers: Contact guard assist       General transfer comment: CGA for safety for STS transfers at EOB with RW, cues for hand placement, safety, and sequencing; demo's fair eccentric lowering with proper hand placement.    Ambulation/Gait Ambulation/Gait assistance: Contact guard assist Gait Distance (Feet): 12 Feet           General Gait Details: CGA for safety with balance and intermittent minA for cues and obstacle negotiation to amb short distance in room with RW. Demo's decreased step length/foot clearance bil, kyphotic posture, veering, and decrease RW proximity. Cues for RW proximity.     Balance Overall balance assessment: Needs assistance Sitting-balance support: No upper extremity supported Sitting balance-[name]: Good     Standing balance support: During functional activity, Reliant on assistive device for balance, Bilateral upper extremity supported Standing balance-[name]: Fair Standing  balance comment: standing balance in RW                             Pertinent Vitals/Pain Pain  Assessment Pain Assessment: Faces Faces Pain Scale: Hurts little more Pain Location: R LE from fall Pain Descriptors / Indicators: Discomfort, Sore Pain Intervention(s): Monitored during session, Repositioned    Home Living Family/patient expects to be discharged to:: Private residence Living Arrangements: Alone   Type of Home: Apartment Home Access: Level entry       Home Layout: One level Home Equipment: Rollator (4 wheels);Cane - single point;Grab bars - tub/shower;Grab bars - toilet;Lift chair;Transport chair;Tub bench;Rolling Walker (2 wheels);Shower seat;Other (comment) Additional Comments: Pt lives in senior living apartment. She sleeps sometimes in lift chair and has leg lifter.    Prior Function Prior Level of Function : Needs assist;History of Falls (last six months)             Mobility Comments: mod I with rollator in the home, used w/c outside of the home ADLs Comments: Pt reports Ind with self care. She uses public transportation rides in wheelchair to pick up groceries. She endorses daughter coming next week to help?     Extremity/Trunk Assessment   Upper Extremity Assessment Upper Extremity Assessment: Generalized weakness    Lower Extremity Assessment Lower Extremity Assessment: Generalized weakness       Communication   Communication Communication: Impaired Factors Affecting Communication: Hearing impaired    Cognition Arousal: Alert Behavior During Therapy: WFL for tasks assessed/performed                             Following commands: Intact       Cueing Cueing Techniques: Verbal cues     General Comments General comments (skin integrity, edema, etc.): distal RLE wrapped in kerlix, covering injury from recent fall; +3-4 pitting edema in distal BLE that is tender. HR ranging from 60's-70's throughout session with elevated RR up to low 20's at rest. SpO2 >/= 95% throughout session on RA, wheezing and labored breathing noted  despite pt denying SOB/DOE.    Exercises Other Exercises Other Exercises: Edu re: PT role/POC, DC recs, safety with functional mobility, activity tolerance; she verbalized understanding.   Assessment/Plan    PT Assessment Patient needs continued PT services  PT Problem List Decreased strength;Decreased range of motion;Decreased activity tolerance;Decreased balance;Decreased mobility;Decreased knowledge of precautions;Decreased safety awareness;Pain;Decreased skin integrity;Cardiopulmonary status limiting activity       PT Treatment Interventions DME instruction;Gait training;Functional mobility training;Therapeutic exercise;Therapeutic activities;Neuromuscular re-education;Balance training;Patient/family education    PT Goals (Current goals can be found in the Care Plan section)  Acute Rehab PT Goals Patient Stated Goal: go home PT Goal Formulation: With patient Time For Goal Achievement: 03/11/24 Potential to Achieve Goals: Fair    Frequency Min 2X/week        AM-PAC PT 6 Clicks Mobility  Outcome Measure Help needed turning from your back to your side while in a flat bed without using bedrails?: A Little Help needed moving from lying on your back to sitting on the side of a flat bed without using bedrails?: A Little Help needed moving to and from a bed to a chair (including a wheelchair)?: A Little Help needed standing up from a chair using your arms (e.g., wheelchair or bedside chair)?: A Little Help needed to walk in hospital room?: A Little Help needed climbing  3-5 steps with a railing? : A Lot 6 Click Score: 17    End of Session Equipment Utilized During Treatment: Gait belt Activity Tolerance: Patient tolerated treatment well;Patient limited by fatigue Patient left: with chair alarm set;with call bell/phone within reach;in bed Nurse Communication: Mobility status PT Visit Diagnosis: Muscle weakness (generalized) (M62.81);Difficulty in walking, not elsewhere  classified (R26.2);Unsteadiness on feet (R26.81);Repeated falls (R29.6);Pain Pain - Right/Left: Right Pain - part of body: Leg    Time: 8882-8866 PT Time Calculation (min) (ACUTE ONLY): 16 min   Charges:   PT Evaluation $PT Eval Moderate Complexity: 1 Mod PT Treatments $Therapeutic Activity: 8-22 mins PT General Charges $$ ACUTE PT VISIT: 1 Visit        Camie CHARLENA Kluver, PT, DPT 12:23 PM,03/12/24 Physical Therapist - [facility]

## 2024-03-13 DIAGNOSIS — N184 Chronic kidney disease, stage 4 (severe): Secondary | ICD-10-CM | POA: Diagnosis not present

## 2024-03-13 DIAGNOSIS — J441 Chronic obstructive pulmonary disease with (acute) exacerbation: Secondary | ICD-10-CM | POA: Diagnosis not present

## 2024-03-13 DIAGNOSIS — U071 COVID-19: Secondary | ICD-10-CM | POA: Insufficient documentation

## 2024-03-13 DIAGNOSIS — I5043 Acute on chronic combined systolic (congestive) and diastolic (congestive) heart failure: Secondary | ICD-10-CM | POA: Diagnosis not present

## 2024-03-13 LAB — GLUCOSE, CAPILLARY
Glucose-Capillary: 186 mg/dL — ABNORMAL HIGH (ref 70–99)
Glucose-Capillary: 171 mg/dL — ABNORMAL HIGH (ref 70–99)
Glucose-Capillary: 166 mg/dL — ABNORMAL HIGH (ref 70–99)
Glucose-Capillary: 287 mg/dL — ABNORMAL HIGH (ref 70–99)

## 2024-03-13 LAB — BASIC METABOLIC PANEL WITH GFR
Anion gap: 14 (ref 5–15)
BUN: 83 mg/dL — ABNORMAL HIGH (ref 8–23)
CO2: 24 mmol/L (ref 22–32)
Calcium: 9 mg/dL (ref 8.9–10.3)
Chloride: 102 mmol/L (ref 98–111)
Creatinine, Ser: 2.9 mg/dL — ABNORMAL HIGH (ref 0.44–1.00)
GFR, Estimated: 15 mL/min — ABNORMAL LOW (ref >60)
Glucose, Bld: 218 mg/dL — ABNORMAL HIGH (ref 70–99)
Potassium: 4 mmol/L (ref 3.5–5.1)
Sodium: 140 mmol/L (ref 135–145)

## 2024-03-13 LAB — MAGNESIUM: Magnesium: 2.2 mg/dL (ref 1.7–2.4)

## 2024-03-13 MED ORDER — IPRATROPIUM-ALBUTEROL 20-100 MCG/ACT IN AERS
1.0000 | INHALATION_SPRAY | Freq: Four times a day (QID) | RESPIRATORY_TRACT | Status: DC
  Administered 2024-03-13 – 2024-03-16 (×12): 1 via RESPIRATORY_TRACT
  Filled 2024-03-13: qty 4

## 2024-03-13 NOTE — Progress Notes (Signed)
 [facility] Liaison Note: This patient is currently enrolled in [facility] outpatient-based palliative care.  Please call for any outpatient based palliative care related questions or concerns. Thank you, Daphne Shed, LPN [facility] Liaison [phone]

## 2024-03-13 NOTE — Plan of Care (Signed)
  Problem: Education: Goal: Ability to describe self-care measures that may prevent or decrease complications (Diabetes Survival Skills Education) will improve Outcome: Progressing Goal: Individualized Educational Video(s) Outcome: Progressing   Problem: Coping: Goal: Ability to adjust to condition or change in health will improve Outcome: Progressing   Problem: Fluid Volume: Goal: Ability to maintain a balanced intake and output will improve Outcome: Progressing   Problem: Health Behavior/Discharge Planning: Goal: Ability to identify and utilize available resources and services will improve Outcome: Progressing Goal: Ability to manage health-related needs will improve Outcome: Progressing   Problem: Metabolic: Goal: Ability to maintain appropriate glucose levels will improve Outcome: Progressing   Problem: Nutritional: Goal: Maintenance of adequate nutrition will improve Outcome: Progressing Goal: Progress toward achieving an optimal weight will improve Outcome: Progressing   Problem: Skin Integrity: Goal: Risk for impaired skin integrity will decrease Outcome: Progressing   Problem: Tissue Perfusion: Goal: Adequacy of tissue perfusion will improve Outcome: Progressing   Problem: Education: Goal: Knowledge of risk factors and measures for prevention of condition will improve Outcome: Progressing   Problem: Coping: Goal: Psychosocial and spiritual needs will be supported Outcome: Progressing   Problem: Respiratory: Goal: Will maintain a patent airway Outcome: Progressing Goal: Complications related to the disease process, condition or treatment will be avoided or minimized Outcome: Progressing   Problem: Education: Goal: Knowledge of General Education information will improve Description: Including pain rating scale, medication(s)/side effects and non-pharmacologic comfort measures Outcome: Progressing   Problem: Health Behavior/Discharge Planning: Goal:  Ability to manage health-related needs will improve Outcome: Progressing   Problem: Clinical Measurements: Goal: Ability to maintain clinical measurements within normal limits will improve Outcome: Progressing Goal: Will remain free from infection Outcome: Progressing Goal: Diagnostic test results will improve Outcome: Progressing Goal: Respiratory complications will improve Outcome: Progressing Goal: Cardiovascular complication will be avoided Outcome: Progressing   Problem: Activity: Goal: Risk for activity intolerance will decrease Outcome: Progressing   Problem: Nutrition: Goal: Adequate nutrition will be maintained Outcome: Progressing   Problem: Coping: Goal: Level of anxiety will decrease Outcome: Progressing   Problem: Elimination: Goal: Will not experience complications related to bowel motility Outcome: Progressing Goal: Will not experience complications related to urinary retention Outcome: Progressing   Problem: Pain Managment: Goal: General experience of comfort will improve and/or be controlled Outcome: Progressing   Problem: Safety: Goal: Ability to remain free from injury will improve Outcome: Progressing   Problem: Skin Integrity: Goal: Risk for impaired skin integrity will decrease Outcome: Progressing   Problem: Education: Goal: Ability to demonstrate management of disease process will improve Outcome: Progressing Goal: Ability to verbalize understanding of medication therapies will improve Outcome: Progressing Goal: Individualized Educational Video(s) Outcome: Progressing   Problem: Activity: Goal: Capacity to carry out activities will improve Outcome: Progressing   Problem: Cardiac: Goal: Ability to achieve and maintain adequate cardiopulmonary perfusion will improve Outcome: Progressing   Problem: Education: Goal: Knowledge of disease or condition will improve Outcome: Progressing Goal: Knowledge of the prescribed therapeutic  regimen will improve Outcome: Progressing Goal: Individualized Educational Video(s) Outcome: Progressing   Problem: Activity: Goal: Ability to tolerate increased activity will improve Outcome: Progressing Goal: Will verbalize the importance of balancing activity with adequate rest periods Outcome: Progressing   Problem: Respiratory: Goal: Ability to maintain a clear airway will improve Outcome: Progressing Goal: Levels of oxygenation will improve Outcome: Progressing Goal: Ability to maintain adequate ventilation will improve Outcome: Progressing

## 2024-03-13 NOTE — TOC Progression Note (Signed)
 Transition of Care [facility]) - Progression Note    Patient Details  Name: Leslie Parker MRN: [id number] Date of Birth: [birth year]  [facility]) CM/SW Contact  [name], LCSW Phone Number: 03/13/2024, 2:43 PM  Clinical Narrative:   Per MD and PT, patient declined SNF placement. Patient is active with [facility] for PT, OT, RN, aide.  Expected Discharge Plan and Services                                               Social Drivers of Health (SDOH) Interventions SDOH Screenings   Food Insecurity: No Food Insecurity (03/12/2024)  Housing: Low Risk  (03/12/2024)  Transportation Needs: No Transportation Needs (03/12/2024)  Utilities: Not At Risk (03/12/2024)  Alcohol Screen: Low Risk  (08/20/2023)  Financial Resource Strain: Low Risk  (01/13/2024)   Received from [facility]  Social Connections: Moderately Isolated (03/12/2024)  Tobacco Use: Low Risk  (03/11/2024)    Readmission Risk Interventions    10/22/2023    2:28 PM 09/16/2023    2:52 PM 08/20/2023    3:45 PM  Readmission Risk Prevention Plan  Transportation Screening Complete Complete Complete  Medication Review [employer]) Complete Complete Complete  PCP or Specialist appointment within 3-5 days of discharge  Complete Complete  HRI or Home Care Consult Not Complete Patient refused Complete  HRI or Home Care Consult Pt Refusal Comments No PT recs at this time.    SW Recovery Care/Counseling Consult Complete Complete Complete  Palliative Care Screening Not Applicable Not Applicable Not Applicable  Skilled Nursing Facility Not Applicable Not Applicable Not Applicable

## 2024-03-13 NOTE — Care Management Important Message (Signed)
 Important Message  Patient Details  Name: Leslie Parker MRN: [id number] Date of Birth: 1932-11-17   Important Message Given:  Yes - Medicare IM     Leslie Parker 03/13/2024, 1:18 PM

## 2024-03-13 NOTE — Plan of Care (Signed)
  Problem: Education: Goal: Ability to describe self-care measures that may prevent or decrease complications (Diabetes Survival Skills Education) will improve Outcome: Progressing   Problem: Coping: Goal: Ability to adjust to condition or change in health will improve Outcome: Progressing   Problem: Fluid Volume: Goal: Ability to maintain a balanced intake and output will improve Outcome: Progressing   Problem: Health Behavior/Discharge Planning: Goal: Ability to identify and utilize available resources and services will improve Outcome: Progressing   Problem: Metabolic: Goal: Ability to maintain appropriate glucose levels will improve Outcome: Progressing   Problem: Nutritional: Goal: Maintenance of adequate nutrition will improve Outcome: Progressing   Problem: Skin Integrity: Goal: Risk for impaired skin integrity will decrease Outcome: Progressing   Problem: Education: Goal: Knowledge of risk factors and measures for prevention of condition will improve Outcome: Progressing   Problem: Respiratory: Goal: Will maintain a patent airway Outcome: Progressing Goal: Complications related to the disease process, condition or treatment will be avoided or minimized Outcome: Progressing   Problem: Education: Goal: Knowledge of General Education information will improve Description: Including pain rating scale, medication(s)/side effects and non-pharmacologic comfort measures Outcome: Progressing

## 2024-03-13 NOTE — TOC Initial Note (Signed)
 Transition of Care [facility]) - Initial/Assessment Note    Patient Details  Name: Leslie Parker MRN: [id number] Date of Birth: 1932/09/18  Transition of Care [facility] CM/SW Contact:    [name], LCSW Phone Number: 03/13/2024, 11:15 AM  Clinical Narrative:                    Pt is receiving HH with [facility].  SW expressed to [facility] that pt is expected to discharge 03/14/24     Patient Goals and CMS Choice            Expected Discharge Plan and Services                                              Prior Living Arrangements/Services                       Activities of Daily Living   ADL Screening (condition at time of admission) Independently performs ADLs?: Yes (appropriate for developmental age) Is the patient deaf or have difficulty hearing?: No Does the patient have difficulty seeing, even when wearing glasses/contacts?: No Does the patient have difficulty concentrating, remembering, or making decisions?: No  Permission Sought/Granted                  Emotional Assessment              Admission diagnosis:  CKD (chronic kidney disease) stage 4, GFR 15-29 ml/min (HCC) [N18.4] COPD exacerbation (HCC) [J44.1] Acute on chronic combined systolic and diastolic congestive heart failure (HCC) [I50.43] Patient Active Problem List   Diagnosis Date Noted   COVID-19 virus infection 03/13/2024   Acute on chronic combined systolic and diastolic congestive heart failure (HCC) 03/12/2024   COPD with acute exacerbation (HCC) 03/12/2024   Protein-calorie malnutrition, severe 01/22/2024   Acute on chronic systolic CHF (congestive heart failure) (HCC) 12/14/2023   Scapular fracture 11/26/2023   Acute hypoxic respiratory failure (HCC) 11/15/2023   Do not resuscitate 11/10/2023   CHF exacerbation (HCC) 11/10/2023   Myocardial injury 11/09/2023   AKI (acute kidney injury) (HCC) 11/05/2023   Insomnia 11/05/2023   Frequent hospital admissions  11/05/2023   Acute exacerbation of CHF (congestive heart failure) (HCC) 10/22/2023   Elevated troponin 10/22/2023   Malnutrition of moderate degree 09/17/2023   CHF (congestive heart failure) (HCC) 09/15/2023   Acute on chronic HFrEF (heart failure with reduced ejection fraction) (HCC) 08/19/2023   Pleural effusion due to CHF (congestive heart failure) (HCC) 08/19/2023   HTN (hypertension), malignant 08/19/2023   Ileus (HCC) 08/12/2023   Large bowel obstruction (HCC) 08/12/2023   CKD (chronic kidney disease) stage 4, GFR 15-29 ml/min (HCC) 08/12/2023   HFrEF (heart failure with reduced ejection fraction) (HCC) 08/12/2023   Acute on chronic combined systolic and diastolic CHF (congestive heart failure) (HCC) 08/10/2023   Hyponatremia 05/05/2023   Overweight (BMI 25.0-29.9) 05/01/2023   Left hip pain 05/01/2023   Hypokalemia 04/30/2023   Anemia 03/21/2023   Abnormal LFTs 03/20/2023   Fall at home, initial encounter 02/13/2023   Type II diabetes mellitus with renal manifestations (HCC) 02/13/2023   Chronic diastolic CHF (congestive heart failure) (HCC) 02/13/2023   CKD stage 3 due to type 2 diabetes mellitus (HCC) 11/24/2022   Paroxysmal atrial fibrillation (HCC) 11/24/2022   Sick sinus syndrome (HCC) 11/15/2022   Acute  on chronic diastolic CHF (congestive heart failure) (HCC) 08/27/2022   COPD (chronic obstructive pulmonary disease) (HCC) 08/23/2022   Chronic a-fib (HCC) 08/22/2022   Diabetes mellitus without complication (HCC)    Stage 3b chronic kidney disease (HCC) - Baseline scr 1.8-2.0    Postural dizziness with presyncope    Hypomagnesemia    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 [place] [address] Phone: [phone] Fax: [phone]  [id number] [place] - [address] [place] [address] Phone: [phone] Fax: [phone]  [facility] [address] [place] [address] Phone: [phone] Fax: [phone]     Social Drivers of Health (SDOH) Social History: SDOH Screenings   Food Insecurity: No Food Insecurity (03/12/2024)  Housing: Low Risk  (03/12/2024)  Transportation Needs: No Transportation Needs (03/12/2024)  Utilities: Not At Risk (03/12/2024)  Alcohol Screen: Low Risk  (08/20/2023)  Financial Resource Strain: Low Risk  (01/13/2024)   Received from [facility]  Social Connections: Moderately Isolated (03/12/2024)  Tobacco Use: Low Risk  (03/11/2024)   SDOH Interventions:     Readmission Risk Interventions    10/22/2023    2:28 PM 09/16/2023    2:52 PM 08/20/2023    3:45 PM  Readmission Risk Prevention Plan  Transportation Screening Complete Complete Complete  Medication Review [employer]) Complete Complete Complete  PCP or Specialist appointment within 3-5 days of discharge  Complete Complete  HRI or Home Care Consult Not Complete Patient refused Complete  HRI or Home Care Consult Pt Refusal Comments No PT recs at this time.    SW Recovery Care/Counseling Consult Complete Complete Complete  Palliative Care Screening Not Applicable Not Applicable Not Applicable  Skilled Nursing Facility Not Applicable Not Applicable Not Applicable

## 2024-03-13 NOTE — Progress Notes (Signed)
 Progress Note   Patient: Leslie Parker FMW:[id number] DOB: 1933/07/05 DOA: 03/11/2024     1 DOS: the patient was seen and examined on 03/13/2024   Brief hospital course: Leslie Parker is a 89 y.o. female with medical history significant of  sCHF with EF 25-30%, SSS (s/p of PPM), A fib on Eliquis , HTN, HLD, DM, COPD, CKD-IV, osteopenia, ileus, who presents with SOB.  Chest x-ray showed cardiomegaly with vascular congestion, small amount of bilateral pleural effusion.  BNP more than 4000.  She was diagnosed with acute on chronic systolic congestive heart failure, was started on IV Lasix .  She is also placed on steroids for bronchospasm.  However, her COVID test came back positive.   Principal Problem:   Acute on chronic combined systolic and diastolic congestive heart failure (HCC) Active Problems:   COPD with acute exacerbation (HCC)   Myocardial injury   Paroxysmal atrial fibrillation (HCC)   Type II diabetes mellitus with renal manifestations (HCC)   CKD (chronic kidney disease) stage 4, GFR 15-29 ml/min (HCC)   HTN (hypertension), malignant   Essential hypertension   Fall at home, initial encounter   Assessment and Plan: Acute on chronic combined systolic and diastolic congestive heart failure (HCC) Myocardial injury secondary to congestive heart failure. : Patient has SOB, 1+ leg edema, significantly elevated BNP> 4500, clinically consistent with CHF exacerbation.  2D echo on 08/09/2023 showed EF 25-30%.  Treated with IV Lasix , he is diuresing well, renal function started getting worse, I will discontinue Lasix  for now.  Plan to start oral Lasix  tomorrow.   COPD with acute exacerbation (HCC) COVID-19 infection.:  Patient has significant wheezing, this is caused by COVID infection.  Continue steroids.  Continue bronchodilator.  I have scheduled Combivent .  She still has some bronchospasm.     HTN (hypertension) -IV hydralazine  as needed -Coreg , oral hydralazine    Chronic a-fib  (HCC):  -Eliquis  -Coreg , amodarone Rate still controlled.   CKD (chronic kidney disease) stage 4, GFR 15-29 ml/min [facility]): Renal function stable.  Baseline creatinine 3.11 03/05/24.   Renal function starting going up after Lasix , recheck level tomorrow.   Type II diabetes mellitus with renal manifestations [facility]): Recent A1c 7.0.  -Sliding scale insulin    Fall at home, initial encounter Skin tear in the right shin. -consult TOC for HH need or rehab/SNF placement -PT/OT - wound care per RN for skin tear in right shin area -f/u CT-head   Poor prognosis. Patient has severe LV dysfunction, advanced age, CKD stage IV.  And not multiple other medical problems, her long-term prognosis is poor.    Discharge planning. Most likely will discharge to home with home health tomorrow.  PT/OT has recommended nursing home placement.  However, patient states that her daughter will come to stay with her for 1 week, she refused nursing home placement.     Subjective:  Patient doing much better today, still has some cough, wheezing.  Short of breath has improved.  Physical Exam: Vitals:   03/13/24 0345 03/13/24 0500 03/13/24 0616 03/13/24 0917  BP: (!) 142/76  (!) 150/70 (!) 162/88  Pulse: 62   61  Resp: 19   20  Temp: 98.3 F (36.8 C)   (!) 97.3 F (36.3 C)  TempSrc: Oral     SpO2: 97%   96%  Weight:  72.8 kg    Height:       General exam: Appears calm and comfortable  Respiratory system: Mild wheezing. Respiratory effort normal. Cardiovascular system:  Irregular. No JVD, murmurs, rubs, gallops or clicks. No pedal edema. Gastrointestinal system: Abdomen is nondistended, soft and nontender. No organomegaly or masses felt. Normal bowel sounds heard. Central nervous system: Alert and oriented x3. No focal neurological deficits. Extremities: Symmetric 5 x 5 power. Skin: No rashes, lesions or ulcers Psychiatry: Judgement and insight appear normal. Mood & affect appropriate.    Data  Reviewed:  Lab results reviewed.  Family Communication: Granddaughter updated over the phone  Disposition: Status is: Inpatient Remains inpatient appropriate because: Severity of disease.     Time spent: 35 minutes  Author: Murvin Mana, MD 03/13/2024 11:06 AM  For on call review [email or web address].

## 2024-03-14 ENCOUNTER — Inpatient Hospital Stay

## 2024-03-14 DIAGNOSIS — J441 Chronic obstructive pulmonary disease with (acute) exacerbation: Secondary | ICD-10-CM | POA: Diagnosis not present

## 2024-03-14 DIAGNOSIS — I5043 Acute on chronic combined systolic (congestive) and diastolic (congestive) heart failure: Secondary | ICD-10-CM | POA: Diagnosis not present

## 2024-03-14 DIAGNOSIS — J690 Pneumonitis due to inhalation of food and vomit: Secondary | ICD-10-CM | POA: Diagnosis not present

## 2024-03-14 DIAGNOSIS — U071 COVID-19: Secondary | ICD-10-CM

## 2024-03-14 LAB — BASIC METABOLIC PANEL WITH GFR
Anion gap: 5 (ref 5–15)
BUN: 89 mg/dL — ABNORMAL HIGH (ref 8–23)
CO2: 29 mmol/L (ref 22–32)
Calcium: 8.5 mg/dL — ABNORMAL LOW (ref 8.9–10.3)
Chloride: 101 mmol/L (ref 98–111)
Creatinine, Ser: 2.96 mg/dL — ABNORMAL HIGH (ref 0.44–1.00)
GFR, Estimated: 15 mL/min — ABNORMAL LOW (ref >60)
Glucose, Bld: 204 mg/dL — ABNORMAL HIGH (ref 70–99)
Potassium: 3.9 mmol/L (ref 3.5–5.1)
Sodium: 135 mmol/L (ref 135–145)

## 2024-03-14 LAB — GLUCOSE, CAPILLARY
Glucose-Capillary: 207 mg/dL — ABNORMAL HIGH (ref 70–99)
Glucose-Capillary: 192 mg/dL — ABNORMAL HIGH (ref 70–99)
Glucose-Capillary: 87 mg/dL (ref 70–99)
Glucose-Capillary: 146 mg/dL — ABNORMAL HIGH (ref 70–99)

## 2024-03-14 LAB — CBC
HCT: 31.2 % — ABNORMAL LOW (ref 36.0–46.0)
Hemoglobin: 10.2 g/dL — ABNORMAL LOW (ref 12.0–15.0)
MCH: 31.5 pg (ref 26.0–34.0)
MCHC: 32.7 g/dL (ref 30.0–36.0)
MCV: 96.3 fL (ref 80.0–100.0)
Platelets: 194 K/uL (ref 150–400)
RBC: 3.24 MIL/uL — ABNORMAL LOW (ref 3.87–5.11)
RDW: 16.6 % — ABNORMAL HIGH (ref 11.5–15.5)
WBC: 6.8 K/uL (ref 4.0–10.5)
nRBC: 0 % (ref 0.0–0.2)

## 2024-03-14 LAB — TROPONIN I (HIGH SENSITIVITY)
Troponin I (High Sensitivity): 32 ng/L — ABNORMAL HIGH (ref <18)
Troponin I (High Sensitivity): 32 ng/L — ABNORMAL HIGH (ref <18)

## 2024-03-14 LAB — BLOOD GAS, ARTERIAL
Acid-base deficit: 0.4 mmol/L (ref 0.0–2.0)
Bicarbonate: 25.4 mmol/L (ref 20.0–28.0)
O2 Saturation: 96.5 %
Patient temperature: 37
pCO2 arterial: 45 mmHg (ref 32–48)
pH, Arterial: 7.36 (ref 7.35–7.45)
pO2, Arterial: 73 mmHg — ABNORMAL LOW (ref 83–108)

## 2024-03-14 LAB — MAGNESIUM: Magnesium: 2.1 mg/dL (ref 1.7–2.4)

## 2024-03-14 LAB — PROCALCITONIN: Procalcitonin: <0.1 ng/mL

## 2024-03-14 MED ORDER — OXYCODONE-ACETAMINOPHEN 5-325 MG PO TABS
1.0000 | ORAL_TABLET | Freq: Three times a day (TID) | ORAL | Status: DC | PRN
  Administered 2024-03-14: 1 via ORAL
  Filled 2024-03-14 (×2): qty 1

## 2024-03-14 MED ORDER — PREDNISONE 10 MG PO TABS
10.0000 mg | ORAL_TABLET | Freq: Every day | ORAL | Status: DC
  Administered 2024-03-14 – 2024-03-22 (×9): 10 mg via ORAL
  Filled 2024-03-14 (×9): qty 1

## 2024-03-14 MED ORDER — ALBUTEROL SULFATE HFA 108 (90 BASE) MCG/ACT IN AERS
2.0000 | INHALATION_SPRAY | RESPIRATORY_TRACT | Status: DC | PRN
  Administered 2024-03-14 – 2024-03-21 (×7): 2 via RESPIRATORY_TRACT
  Filled 2024-03-14: qty 6.7

## 2024-03-14 MED ORDER — ALBUMIN HUMAN 25 % IV SOLN
12.5000 g | Freq: Once | INTRAVENOUS | Status: AC
  Administered 2024-03-14: 12.5 g via INTRAVENOUS
  Filled 2024-03-14: qty 50

## 2024-03-14 MED ORDER — SODIUM CHLORIDE 0.9 % IV SOLN: 1.5000 g | Freq: Two times a day (BID) | INTRAVENOUS | Status: DC

## 2024-03-14 MED ORDER — SODIUM CHLORIDE 0.9 % IV SOLN
3.0000 g | Freq: Two times a day (BID) | INTRAVENOUS | Status: DC
  Administered 2024-03-14 – 2024-03-15 (×2): 3 g via INTRAVENOUS
  Filled 2024-03-14 (×3): qty 8

## 2024-03-14 MED ORDER — FUROSEMIDE 10 MG/ML IJ SOLN
40.0000 mg | Freq: Every day | INTRAMUSCULAR | Status: DC
  Administered 2024-03-14: 40 mg via INTRAVENOUS
  Filled 2024-03-14: qty 4

## 2024-03-14 NOTE — Progress Notes (Addendum)
 Progress Note   Patient: Leslie Parker FMW:[id number] DOB: [birth year] DOA: 03/11/2024     2 DOS: the patient was seen and examined on 03/14/2024   Brief hospital course: Leslie Parker is a 89 y.o. female with medical history significant of  sCHF with EF 25-30%, SSS (s/p of PPM), A fib on Eliquis , HTN, HLD, DM, COPD, CKD-IV, osteopenia, ileus, who presents with SOB.  Chest x-ray showed cardiomegaly with vascular congestion, small amount of bilateral pleural effusion.  BNP more than 4000.  She was diagnosed with acute on chronic systolic congestive heart failure, was started on IV Lasix .  She is also placed on steroids for bronchospasm.  However, her COVID test came back positive.   Principal Problem:   Acute on chronic combined systolic and diastolic congestive heart failure (HCC) Active Problems:   COPD with acute exacerbation (HCC)   Myocardial injury   Paroxysmal atrial fibrillation (HCC)   Type II diabetes mellitus with renal manifestations (HCC)   CKD (chronic kidney disease) stage 4, GFR 15-29 ml/min (HCC)   HTN (hypertension), malignant   Essential hypertension   Fall at home, initial encounter   COVID-19 virus infection   Assessment and Plan: Acute on chronic combined systolic and diastolic congestive heart failure (HCC) Myocardial injury secondary to congestive heart failure. : Patient has SOB, 1+ leg edema, significantly elevated BNP> 4500, clinically consistent with CHF exacerbation.  2D echo on 08/09/2023 showed EF 25-30%.  Treated with IV Lasix , he is diuresing well, renal function started getting worse. Patient still has significant rhonchi in the bases, I will change Lasix  to 40 mg daily, give a dose of albumin .  Recheck renal function tomorrow.   COPD with acute exacerbation (HCC) COVID-19 infection.:  Patient has significant wheezing, this is caused by COVID infection.  Continue steroids.  Continue bronchodilator.  I have scheduled Combivent .  Reduce the rate to  prednisone  10 mg daily due to significant hyperglycemia   HTN (hypertension) -IV hydralazine  as needed -Coreg , oral hydralazine    Chronic a-fib (HCC):  -Eliquis  -Coreg , amodarone No tachycardia.   CKD (chronic kidney disease) stage 4, GFR 15-29 ml/min [facility]): Renal function stable.  Baseline creatinine 3.11 03/05/24.   As above.   Type II diabetes mellitus with renal manifestations (HCC) Uncontrolled type 2 diabetes with hyperglycemia.: Recent A1c 7.0.  Developed significant hyperglycemia secondary to steroid use.  Prednisone  dose decreased.  Continue to follow.   Fall at home, initial encounter Skin tear in the right shin. -consult TOC for HH need or rehab/SNF placement -PT/OT - wound care per RN for skin tear in right shin area -f/u CT-head   Poor prognosis. Patient has severe LV dysfunction, advanced age, CKD stage IV.  And not multiple other medical problems, her long-term prognosis is poor.    Patient had increased shortness of breath, CXR showed bilateral pleural effusion, more on right with airspace disease. Check procal, start unasyn  for possible aspiration pneumonia.      Subjective:  Patient still complain short of breath and wheezing today.  Cough, nonproductive.  Physical Exam: Vitals:   03/14/24 0034 03/14/24 0442 03/14/24 0500 03/14/24 0844  BP: 139/76 139/81  (!) 146/69  Pulse: (!) 59 62  (!) 59  Resp: 18 20    Temp: 98.5 F (36.9 C) (!) 97.5 F (36.4 C)  97.6 F (36.4 C)  TempSrc:  Oral    SpO2: 97% 96%  95%  Weight:   72.8 kg   Height:  General exam: Appears calm and comfortable  Respiratory system: Rhonchi in the bases. Respiratory effort normal. Cardiovascular system: Irregular. No JVD, murmurs, rubs, gallops or clicks. No pedal edema. Gastrointestinal system: Abdomen is nondistended, soft and nontender. No organomegaly or masses felt. Normal bowel sounds heard. Central nervous system: Alert and oriented x3. No focal neurological  deficits. Extremities: Symmetric 5 x 5 power. Skin: No rashes, lesions or ulcers Psychiatry: Judgement and insight appear normal. Mood & affect appropriate.    Data Reviewed:  Lab results reviewed.  Family Communication: Granddaughter updated over the phone  Disposition: Status is: Inpatient Remains inpatient appropriate because: Severity of disease, IV treatment     Time spent: 55 minutes  Author: Murvin Mana, MD 03/14/2024 11:43 AM  For on call review [email or web address].

## 2024-03-14 NOTE — Plan of Care (Signed)
  Problem: Education: Goal: Ability to describe self-care measures that may prevent or decrease complications (Diabetes Survival Skills Education) will improve Outcome: Progressing   Problem: Coping: Goal: Ability to adjust to condition or change in health will improve Outcome: Progressing   Problem: Fluid Volume: Goal: Ability to maintain a balanced intake and output will improve Outcome: Progressing   Problem: Health Behavior/Discharge Planning: Goal: Ability to identify and utilize available resources and services will improve Outcome: Progressing   Problem: Nutritional: Goal: Maintenance of adequate nutrition will improve Outcome: Progressing   Problem: Skin Integrity: Goal: Risk for impaired skin integrity will decrease Outcome: Progressing   Problem: Tissue Perfusion: Goal: Adequacy of tissue perfusion will improve Outcome: Progressing   Problem: Respiratory: Goal: Will maintain a patent airway Outcome: Progressing Goal: Complications related to the disease process, condition or treatment will be avoided or minimized Outcome: Progressing

## 2024-03-15 DIAGNOSIS — U071 COVID-19: Secondary | ICD-10-CM | POA: Diagnosis not present

## 2024-03-15 DIAGNOSIS — E039 Hypothyroidism, unspecified: Secondary | ICD-10-CM | POA: Insufficient documentation

## 2024-03-15 DIAGNOSIS — I5043 Acute on chronic combined systolic (congestive) and diastolic (congestive) heart failure: Secondary | ICD-10-CM | POA: Diagnosis not present

## 2024-03-15 DIAGNOSIS — J441 Chronic obstructive pulmonary disease with (acute) exacerbation: Secondary | ICD-10-CM | POA: Diagnosis not present

## 2024-03-15 DIAGNOSIS — T68XXXA Hypothermia, initial encounter: Secondary | ICD-10-CM | POA: Insufficient documentation

## 2024-03-15 DIAGNOSIS — J690 Pneumonitis due to inhalation of food and vomit: Secondary | ICD-10-CM | POA: Diagnosis not present

## 2024-03-15 LAB — BASIC METABOLIC PANEL WITH GFR
Anion gap: 13 (ref 5–15)
BUN: 96 mg/dL — ABNORMAL HIGH (ref 8–23)
CO2: 23 mmol/L (ref 22–32)
Calcium: 8.3 mg/dL — ABNORMAL LOW (ref 8.9–10.3)
Chloride: 99 mmol/L (ref 98–111)
Creatinine, Ser: 3.1 mg/dL — ABNORMAL HIGH (ref 0.44–1.00)
GFR, Estimated: 14 mL/min — ABNORMAL LOW (ref >60)
Glucose, Bld: 140 mg/dL — ABNORMAL HIGH (ref 70–99)
Potassium: 4.2 mmol/L (ref 3.5–5.1)
Sodium: 135 mmol/L (ref 135–145)

## 2024-03-15 LAB — MAGNESIUM: Magnesium: 2 mg/dL (ref 1.7–2.4)

## 2024-03-15 LAB — TSH: TSH: 5.994 u[IU]/mL — ABNORMAL HIGH (ref 0.350–4.500)

## 2024-03-15 LAB — PROCALCITONIN: Procalcitonin: <0.1 ng/mL

## 2024-03-15 LAB — GLUCOSE, CAPILLARY
Glucose-Capillary: 132 mg/dL — ABNORMAL HIGH (ref 70–99)
Glucose-Capillary: 141 mg/dL — ABNORMAL HIGH (ref 70–99)
Glucose-Capillary: 162 mg/dL — ABNORMAL HIGH (ref 70–99)

## 2024-03-15 MED ORDER — ORAL CARE MOUTH RINSE
15.0000 mL | OROMUCOSAL | Status: DC | PRN
  Administered 2024-03-21: 15 mL via OROMUCOSAL

## 2024-03-15 MED ORDER — LEVOTHYROXINE SODIUM 25 MCG PO TABS
25.0000 ug | ORAL_TABLET | Freq: Every day | ORAL | Status: DC
  Administered 2024-03-15 – 2024-03-24 (×10): 25 ug via ORAL
  Filled 2024-03-15 (×10): qty 1

## 2024-03-15 MED ORDER — AMOXICILLIN-POT CLAVULANATE 400-57 MG/5ML PO SUSR
400.0000 mg | Freq: Two times a day (BID) | ORAL | Status: DC
  Administered 2024-03-15 – 2024-03-21 (×13): 400 mg via ORAL
  Filled 2024-03-15 (×13): qty 5

## 2024-03-15 NOTE — Progress Notes (Signed)
 Progress Note   Patient: Leslie Parker FMW:[id number] DOB: February 01, 1933 DOA: 03/11/2024     3 DOS: the patient was seen and examined on 03/15/2024   Brief hospital course: Leslie Parker is a 89 y.o. female with medical history significant of  sCHF with EF 25-30%, SSS (s/p of PPM), A fib on Eliquis , HTN, HLD, DM, COPD, CKD-IV, osteopenia, ileus, who presents with SOB.  Chest x-ray showed cardiomegaly with vascular congestion, small amount of bilateral pleural effusion.  BNP more than 4000.  She was diagnosed with acute on chronic systolic congestive heart failure, was started on IV Lasix .  She is also placed on steroids for bronchospasm.  However, her COVID test came back positive.   Principal Problem:   Acute on chronic combined systolic and diastolic congestive heart failure (HCC) Active Problems:   COPD with acute exacerbation (HCC)   Myocardial injury   Paroxysmal atrial fibrillation (HCC)   Type II diabetes mellitus with renal manifestations (HCC)   CKD (chronic kidney disease) stage 4, GFR 15-29 ml/min (HCC)   HTN (hypertension), malignant   Essential hypertension   Fall at home, initial encounter   COVID-19 virus infection   Aspiration pneumonia (HCC)   Hypothermia   Hypothyroidism   Assessment and Plan: Bilateral lower lobe aspiration pneumonia. Dysphagia Patient had an onset of worsening short of breath and chest pain yesterday.  Troponin not significant elevated.  Chest x-ray showed bilateral pleural effusion, with increased bilateral airspace disease. She was placed on Unasyn , but a procalcitonin level not significant elevated.  This may reflect a chemical aspiration.  Change antibiotic to Augmentin  today. Patient will be seen by speech therapist.  She states that she has chronic dysphagia from esophageal problems, speech therapy may help with instruction for diet.  Not currently a good candidate for EGD.  Will follow-up with GI as outpatient.   Acute on chronic combined  systolic and diastolic congestive heart failure (HCC) Myocardial injury secondary to congestive heart failure. : Patient has SOB, 1+ leg edema, significantly elevated BNP> 4500, clinically consistent with CHF exacerbation.  2D echo on 08/09/2023 showed EF 25-30%.  Treated with IV Lasix , he is diuresing well, renal function started getting worse. Patient still has significant rhonchi in the bases, I have changed Lasix  to 40 mg daily, give a dose of albumin  8/23.  Renal function continue get worse, hold off additional diuretics for now.  Hypothermia.   Hypothyroidism. Patient developed hypothermia last night, was put on heating blanket.  Started on Synthroid  for hypothyroidism.  Also on steroids.  COPD with acute exacerbation (HCC) COVID-19 infection.:  Patient has significant wheezing, this is caused by COVID infection.  Continue steroids.  Continue bronchodilator.  I have scheduled Combivent .  Reduced the rate to prednisone  10 mg daily due to significant hyperglycemia   HTN (hypertension) -IV hydralazine  as needed -Coreg , oral hydralazine    Chronic a-fib (HCC):  -Eliquis  -Coreg , amodarone No tachycardia.   CKD (chronic kidney disease) stage 4, GFR 15-29 ml/min [facility]): Renal function stable.  Baseline creatinine 3.11 03/05/24.   As above.   Type II diabetes mellitus with renal manifestations (HCC) Uncontrolled type 2 diabetes with hyperglycemia.: Recent A1c 7.0.  Developed significant hyperglycemia secondary to steroid use.  Prednisone  dose decreased.  No longer has significant hyperglycemia.  Continue sliding scale insulin  at lower dose.   Fall at home, initial encounter Skin tear in the right shin. -consult TOC for [facility] need or rehab/SNF placement Family wish to bring patient home on Monday.  Subjective:  Patient still has any cough, large amount of airway secretion.  Cough is worse after eating.  Not on oxygen  Physical Exam: Vitals:   03/15/24 0500 03/15/24 0851  03/15/24 0912 03/15/24 1208  BP:  (!) 140/74 (!) 140/74 121/71  Pulse:  62  (!) 59  Resp:  13  20  Temp:  97.6 F (36.4 C)    TempSrc:  Oral    SpO2:  93%  94%  Weight: 71.8 kg     Height:       General exam: Appears calm and comfortable  Respiratory system: Rhonchi in the bases bilaterally. Respiratory effort normal. Cardiovascular system: Irregular. No JVD, murmurs, rubs, gallops or clicks. No pedal edema. Gastrointestinal system: Abdomen is nondistended, soft and nontender. No organomegaly or masses felt. Normal bowel sounds heard. Central nervous system: Alert and oriented x3. No focal neurological deficits. Extremities: Symmetric 5 x 5 power. Skin: No rashes, lesions or ulcers Psychiatry: Judgement and insight appear normal. Mood & affect appropriate.    Data Reviewed:  Chest x-ray and lab results reviewed.  Family Communication: None  Disposition: Status is: Inpatient Remains inpatient appropriate because: Severity of disease.     Time spent: 50 minutes  Author: Murvin Mana, MD 03/15/2024 12:25 PM  For on call review [email or web address].

## 2024-03-15 NOTE — Plan of Care (Signed)
  Problem: Metabolic: Goal: Ability to maintain appropriate glucose levels will improve Outcome: Progressing   Problem: Nutritional: Goal: Maintenance of adequate nutrition will improve Outcome: Progressing   Problem: Respiratory: Goal: Will maintain a patent airway Outcome: Progressing   Problem: Clinical Measurements: Goal: Respiratory complications will improve Outcome: Progressing

## 2024-03-15 NOTE — Evaluation (Signed)
 Clinical/Bedside Swallow Evaluation Patient Details  Name: Leslie Parker MRN: [id number] Date of Birth: 1932-07-26  Today's Date: 03/15/2024 Time: SLP Start Time (ACUTE ONLY): 1340 SLP Stop Time (ACUTE ONLY): 1405 SLP Time Calculation (min) (ACUTE ONLY): 25 min  Past Medical History:  Past Medical History:  Diagnosis Date   Acute kidney injury superimposed on CKD (HCC) 11/24/2022   Asthma    Atrial fibrillation with RVR (HCC)    Cancer (HCC)    Basal Cell   CHF (congestive heart failure) (HCC)    Closed left hip fracture (HCC) 01/25/2017   Closed right hip fracture (HCC) 02/13/2023   Diabetes mellitus without complication (HCC)    Femur fracture, left (HCC) 02/03/2015   Heart murmur    Hypertension    Impetigo    Osteoarthritis    Osteopenia    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   Rapid atrial fibrillation, new onset(HCC) 06/05/2022   Vomiting    can not due to surgery   Wears dentures    full upper and lower   Past Surgical History:  Past Surgical History:  Procedure Laterality Date   ABDOMINAL HYSTERECTOMY     BACK SURGERY     BLADDER SURGERY     mesh   CARPAL TUNNEL RELEASE Bilateral    CATARACT EXTRACTION Right 2017   CATARACT EXTRACTION W/PHACO Left 02/06/2016   Procedure: CATARACT EXTRACTION PHACO AND INTRAOCULAR LENS PLACEMENT (IOC) left eye;  Surgeon: Donzell Arlyce Budd, MD;  Location: [facility];  Service: Ophthalmology;  Laterality: Left;  DIABETIC LEFT Cannot arrive before 9:30   CERVICAL DISC SURGERY     CHOLECYSTECTOMY     EYE SURGERY Bilateral    Cataract Extraction with IOL   FEMUR IM NAIL Left 02/04/2015   Procedure: INTRAMEDULLARY (IM) NAIL FEMORAL;  Surgeon: Franky Cranker, MD;  Location: [facility];  Service: Orthopedics;  Laterality: Left;   HARDWARE REMOVAL Left 01/17/2017   Procedure: HARDWARE REMOVAL;  Surgeon: Cranker Franky, MD;  Location: [facility];  Service: Orthopedics;  Laterality: Left;   HERNIA REPAIR  2014    esophageal and gastric mesh. patient unable to throw up d/t mesh   HIP ARTHROPLASTY Left 01/26/2017   Procedure: ARTHROPLASTY BIPOLAR HIP (HEMIARTHROPLASTY) removal hardware left hip;  Surgeon: Cleotilde Barrio, MD;  Location: [facility];  Service: Orthopedics;  Laterality: Left;   INTRAMEDULLARY (IM) NAIL INTERTROCHANTERIC Right 02/13/2023   Procedure: INTRAMEDULLARY (IM) NAIL INTERTROCHANTERIC;  Surgeon: Edie Norleen PARAS, MD;  Location: [facility];  Service: Orthopedics;  Laterality: Right;   JOINT REPLACEMENT Bilateral    knees   PACEMAKER IMPLANT N/A 11/15/2022   Procedure: PACEMAKER IMPLANT;  Surgeon: Ammon Blunt, MD;  Location: [facility];  Service: Cardiovascular;  Laterality: N/A;   PACEMAKER IMPLANT N/A 11/28/2022   Procedure: PACEMAKER IMPLANT;  Surgeon: Ammon Blunt, MD;  Location: [facility];  Service: Cardiovascular;  Laterality: N/A;  Lead reposition   REPLACEMENT TOTAL KNEE BILATERAL Bilateral [id number]   SHOULDER ARTHROSCOPY WITH OPEN ROTATOR CUFF REPAIR AND DISTAL CLAVICLE ACROMINECTOMY Left 10/25/2016   Procedure: SHOULDER ARTHROSCOPY WITH OPEN ROTATOR CUFF REPAIR AND DISTAL CLAVICLE ACROMINECTOMY;  Surgeon: Franky Cranker, MD;  Location: [facility];  Service: Orthopedics;  Laterality: Left;   THYROID  SURGERY     goiter removed   TOTAL HIP REVISION Left 12/02/2017   Procedure: TOTAL HIP REVISION;  Surgeon: Leora Lynwood SAUNDERS, MD;  Location: [facility];  Service: Orthopedics;  Laterality: Left;   TOTAL SHOULDER REPLACEMENT Right 2012  HPI:  Per MD Progress note, Leslie Parker is a 89 y.o. female with medical history significant of  sCHF with EF 25-30%, SSS (s/p of PPM), A fib on Eliquis , HTN, HLD, DM, COPD, CKD-IV, osteopenia, ileus, who presents with SOB.   Chest x-ray showed cardiomegaly with vascular congestion, small amount of bilateral pleural effusion.  BNP more than 4000.  She was diagnosed with acute on chronic systolic congestive heart failure, was  started on IV Lasix .  She is also placed on steroids for bronchospasm.  However, her COVID test came back positive.     Chest x-ray showed bilateral pleural effusion, with increased bilateral airspace disease.    Assessment / Plan / Recommendation  Clinical Impression  Pt seen for bedside swallow assessment in the setting of concern for aspiration PNA. Per MD progress note, pt states that she has chronic dysphagia from esophageal problems... Not currently a good candidate for EGD. Will follow-up with GI as outpatient.  DG Esophagus completed 09/26/2022 revealing, Relative narrowing of the distal esophagus just proximal to the esophagogastric junction concerning for a mild stricture which does not restrict the passage of a barium tablet. Moderate esophageal dysmotility. Severe gastroesophageal reflux. Baseline congested cough noted, nonproductive. Oral motor function grossly intact. No natural dentition- dentures not present for assessment.   Pt seen with trials of thin liquids (via straw), puree, and regular solids. No overt or subtle s/sx pharyngeal dysphagia noted. No change to vocal quality across trials. Vitals stable for duration of trials (95 and greater for O2). Oral phase grossly intact- with complete manipulation and clearance of regular solid from oral cavity with allowance of extended time for mastication and clearance. Belching noted consistently with trials- expected given known GI complications.   Based on age, overall medical picture, esophageal complications, and current debility, pt is at increased risk of aspiration, therefore recommend aspiration precautions (slow rate, small bites, elevated HOB, and alert for PO intake). Education shared for esophageal precautions as follows  Esophageal precautions: slow rate of intake, smaller more frequent meals, follow bites of food with liquids, remain upright for 30-60 minutes after eating, do not eat/drink 2-3 hours before bedtime except water,  elevate head of bed at least 30 degrees   Recommend slight modification to chopped meats to aid mastication and comfort. Pt reported understanding across education and recommendations. MD and RN aware of recommendations. No further acute SLP intervention indicated.   SLP Visit Diagnosis: Dysphagia, unspecified (R13.10) (suspect esophageal phase)    Aspiration Risk  Mild aspiration risk    Diet Recommendation   Age appropriate regular;Thin (chopped meat)  Medication Administration: Whole meds with liquid    Other  Recommendations Recommended Consults: Consider GI evaluation (as medically indicated) Oral Care Recommendations: Oral care BID;Staff/trained caregiver to provide oral care     Assistance Recommended at Discharge  Intermittent supervision for PO intake   Functional Status Assessment Patient has not had a recent decline in their functional status (suspect at oropharyngeal baseline)    Swallow Study   General Date of Onset: 03/15/24 HPI: Per MD Progress note, Leslie Parker is a 89 y.o. female with medical history significant of  sCHF with EF 25-30%, SSS (s/p of PPM), A fib on Eliquis , HTN, HLD, DM, COPD, CKD-IV, osteopenia, ileus, who presents with SOB.   Chest x-ray showed cardiomegaly with vascular congestion, small amount of bilateral pleural effusion.  BNP more than 4000.  She was diagnosed with acute on chronic systolic congestive heart failure, was started  on IV Lasix .  She is also placed on steroids for bronchospasm.  However, her COVID test came back positive.     Chest x-ray showed bilateral pleural effusion, with increased bilateral airspace disease. Type of Study: Bedside Swallow Evaluation Previous Swallow Assessment: last seen Oct 2024- with recommendation for reg and thin liquids Diet Prior to this Study: Regular;Thin liquids (Level 0) Temperature Spikes Noted: No Respiratory Status: Room air History of Recent Intubation: No Behavior/Cognition:  Alert;Cooperative Oral Cavity Assessment: Within Functional Limits Oral Care Completed by SLP: Yes Oral Cavity - Dentition: Edentulous (dentures not present for session) Vision: Functional for self-feeding Self-Feeding Abilities: Able to feed self Patient Positioning: Upright in bed Baseline Vocal Quality: Normal Volitional Cough: Congested Volitional Swallow: Able to elicit    Oral/Motor/Sensory Function Overall Oral Motor/Sensory Function: Within functional limits   Ice Chips Ice chips: Not tested   Thin Liquid Thin Liquid: Within functional limits Presentation: Straw;Cup    Nectar Thick Nectar Thick Liquid: Not tested   Honey Thick Honey Thick Liquid: Not tested   Puree Puree: Not tested   Solid     Solid: Within functional limits Presentation: Self Fed     [place] Chelesea Weiand Clapp, MS, CCC-SLP Speech Language Pathologist Rehab Services; [facility] [phone] (ascom)   [place] J Clapp 03/15/2024,3:38 PM

## 2024-03-15 NOTE — Progress Notes (Signed)
 Bair hugger turned off at this time. Temp 96.56F axillary.

## 2024-03-15 NOTE — Plan of Care (Signed)
  Problem: Education: Goal: Ability to describe self-care measures that may prevent or decrease complications (Diabetes Survival Skills Education) will improve Outcome: Progressing Goal: Individualized Educational Video(s) Outcome: Progressing   Problem: Coping: Goal: Ability to adjust to condition or change in health will improve Outcome: Progressing   Problem: Fluid Volume: Goal: Ability to maintain a balanced intake and output will improve Outcome: Progressing   Problem: Health Behavior/Discharge Planning: Goal: Ability to identify and utilize available resources and services will improve Outcome: Progressing Goal: Ability to manage health-related needs will improve Outcome: Progressing   Problem: Metabolic: Goal: Ability to maintain appropriate glucose levels will improve Outcome: Progressing   Problem: Nutritional: Goal: Maintenance of adequate nutrition will improve Outcome: Progressing Goal: Progress toward achieving an optimal weight will improve Outcome: Progressing   Problem: Skin Integrity: Goal: Risk for impaired skin integrity will decrease Outcome: Progressing   Problem: Tissue Perfusion: Goal: Adequacy of tissue perfusion will improve Outcome: Progressing   Problem: Education: Goal: Knowledge of risk factors and measures for prevention of condition will improve Outcome: Progressing   Problem: Coping: Goal: Psychosocial and spiritual needs will be supported Outcome: Progressing   Problem: Respiratory: Goal: Will maintain a patent airway Outcome: Progressing Goal: Complications related to the disease process, condition or treatment will be avoided or minimized Outcome: Progressing   Problem: Education: Goal: Knowledge of General Education information will improve Description: Including pain rating scale, medication(s)/side effects and non-pharmacologic comfort measures Outcome: Progressing   Problem: Health Behavior/Discharge Planning: Goal:  Ability to manage health-related needs will improve Outcome: Progressing   Problem: Clinical Measurements: Goal: Ability to maintain clinical measurements within normal limits will improve Outcome: Progressing Goal: Will remain free from infection Outcome: Progressing Goal: Diagnostic test results will improve Outcome: Progressing Goal: Respiratory complications will improve Outcome: Progressing Goal: Cardiovascular complication will be avoided Outcome: Progressing   Problem: Activity: Goal: Risk for activity intolerance will decrease Outcome: Progressing   Problem: Nutrition: Goal: Adequate nutrition will be maintained Outcome: Progressing   Problem: Coping: Goal: Level of anxiety will decrease Outcome: Progressing   Problem: Elimination: Goal: Will not experience complications related to bowel motility Outcome: Progressing Goal: Will not experience complications related to urinary retention Outcome: Progressing   Problem: Pain Managment: Goal: General experience of comfort will improve and/or be controlled Outcome: Progressing   Problem: Safety: Goal: Ability to remain free from injury will improve Outcome: Progressing   Problem: Skin Integrity: Goal: Risk for impaired skin integrity will decrease Outcome: Progressing   Problem: Education: Goal: Ability to demonstrate management of disease process will improve Outcome: Progressing Goal: Ability to verbalize understanding of medication therapies will improve Outcome: Progressing Goal: Individualized Educational Video(s) Outcome: Progressing   Problem: Activity: Goal: Capacity to carry out activities will improve Outcome: Progressing   Problem: Cardiac: Goal: Ability to achieve and maintain adequate cardiopulmonary perfusion will improve Outcome: Progressing   Problem: Education: Goal: Knowledge of disease or condition will improve Outcome: Progressing Goal: Knowledge of the prescribed therapeutic  regimen will improve Outcome: Progressing Goal: Individualized Educational Video(s) Outcome: Progressing   Problem: Activity: Goal: Ability to tolerate increased activity will improve Outcome: Progressing Goal: Will verbalize the importance of balancing activity with adequate rest periods Outcome: Progressing   Problem: Respiratory: Goal: Ability to maintain a clear airway will improve Outcome: Progressing Goal: Levels of oxygenation will improve Outcome: Progressing Goal: Ability to maintain adequate ventilation will improve Outcome: Progressing

## 2024-03-15 NOTE — Progress Notes (Signed)
 Bair hugger placed on patient. Rectal temp previous to placement 95.25F. will recheck in 1 hours time.

## 2024-03-16 DIAGNOSIS — U071 COVID-19: Secondary | ICD-10-CM | POA: Diagnosis not present

## 2024-03-16 DIAGNOSIS — I5043 Acute on chronic combined systolic (congestive) and diastolic (congestive) heart failure: Secondary | ICD-10-CM | POA: Diagnosis not present

## 2024-03-16 DIAGNOSIS — J690 Pneumonitis due to inhalation of food and vomit: Secondary | ICD-10-CM | POA: Diagnosis not present

## 2024-03-16 DIAGNOSIS — J441 Chronic obstructive pulmonary disease with (acute) exacerbation: Secondary | ICD-10-CM | POA: Diagnosis not present

## 2024-03-16 LAB — BASIC METABOLIC PANEL WITH GFR
Anion gap: 10 (ref 5–15)
BUN: 98 mg/dL — ABNORMAL HIGH (ref 8–23)
CO2: 23 mmol/L (ref 22–32)
Calcium: 8.2 mg/dL — ABNORMAL LOW (ref 8.9–10.3)
Chloride: 99 mmol/L (ref 98–111)
Creatinine, Ser: 3.01 mg/dL — ABNORMAL HIGH (ref 0.44–1.00)
GFR, Estimated: 14 mL/min — ABNORMAL LOW (ref >60)
Glucose, Bld: 151 mg/dL — ABNORMAL HIGH (ref 70–99)
Potassium: 3.8 mmol/L (ref 3.5–5.1)
Sodium: 132 mmol/L — ABNORMAL LOW (ref 135–145)

## 2024-03-16 LAB — CBC
HCT: 32.6 % — ABNORMAL LOW (ref 36.0–46.0)
Hemoglobin: 10.7 g/dL — ABNORMAL LOW (ref 12.0–15.0)
MCH: 31 pg (ref 26.0–34.0)
MCHC: 32.8 g/dL (ref 30.0–36.0)
MCV: 94.5 fL (ref 80.0–100.0)
Platelets: 233 K/uL (ref 150–400)
RBC: 3.45 MIL/uL — ABNORMAL LOW (ref 3.87–5.11)
RDW: 16.2 % — ABNORMAL HIGH (ref 11.5–15.5)
WBC: 7.1 K/uL (ref 4.0–10.5)
nRBC: 0 % (ref 0.0–0.2)

## 2024-03-16 LAB — GLUCOSE, CAPILLARY
Glucose-Capillary: 166 mg/dL — ABNORMAL HIGH (ref 70–99)
Glucose-Capillary: 171 mg/dL — ABNORMAL HIGH (ref 70–99)
Glucose-Capillary: 93 mg/dL (ref 70–99)
Glucose-Capillary: 134 mg/dL — ABNORMAL HIGH (ref 70–99)

## 2024-03-16 MED ORDER — HYDROCOD POLI-CHLORPHE POLI ER 10-8 MG/5ML PO SUER
5.0000 mL | Freq: Two times a day (BID) | ORAL | Status: DC
  Administered 2024-03-16 – 2024-03-22 (×14): 5 mL via ORAL
  Filled 2024-03-16 (×14): qty 5

## 2024-03-16 MED ORDER — FUROSEMIDE 10 MG/ML IJ SOLN
60.0000 mg | Freq: Once | INTRAMUSCULAR | Status: AC
  Administered 2024-03-16: 60 mg via INTRAVENOUS
  Filled 2024-03-16: qty 6

## 2024-03-16 MED ORDER — GUAIFENESIN ER 600 MG PO TB12
600.0000 mg | ORAL_TABLET | Freq: Two times a day (BID) | ORAL | Status: DC
  Administered 2024-03-16 – 2024-03-24 (×17): 600 mg via ORAL
  Filled 2024-03-16 (×17): qty 1

## 2024-03-16 MED ORDER — IPRATROPIUM-ALBUTEROL 20-100 MCG/ACT IN AERS
1.0000 | INHALATION_SPRAY | RESPIRATORY_TRACT | Status: DC
  Administered 2024-03-16 – 2024-03-21 (×32): 1 via RESPIRATORY_TRACT
  Filled 2024-03-16: qty 4

## 2024-03-16 MED ORDER — LACTULOSE 10 GM/15ML PO SOLN
20.0000 g | Freq: Once | ORAL | Status: AC
  Administered 2024-03-16: 20 g via ORAL
  Filled 2024-03-16: qty 30

## 2024-03-16 MED ORDER — SENNOSIDES-DOCUSATE SODIUM 8.6-50 MG PO TABS
2.0000 | ORAL_TABLET | Freq: Two times a day (BID) | ORAL | Status: DC
  Administered 2024-03-16: 1 via ORAL
  Administered 2024-03-16 – 2024-03-24 (×15): 2 via ORAL
  Filled 2024-03-16 (×16): qty 2

## 2024-03-16 MED ORDER — FUROSEMIDE 40 MG PO TABS
40.0000 mg | ORAL_TABLET | Freq: Two times a day (BID) | ORAL | Status: DC
  Administered 2024-03-16: 40 mg via ORAL
  Filled 2024-03-16: qty 1

## 2024-03-16 MED ORDER — SPIRONOLACTONE 25 MG PO TABS: 25.0000 mg | ORAL_TABLET | Freq: Every day | ORAL | Status: DC

## 2024-03-16 NOTE — Plan of Care (Signed)
  Problem: Education: Goal: Ability to describe self-care measures that may prevent or decrease complications (Diabetes Survival Skills Education) will improve Outcome: Progressing Goal: Individualized Educational Video(s) Outcome: Progressing   Problem: Coping: Goal: Ability to adjust to condition or change in health will improve Outcome: Progressing   Problem: Fluid Volume: Goal: Ability to maintain a balanced intake and output will improve Outcome: Progressing   Problem: Health Behavior/Discharge Planning: Goal: Ability to identify and utilize available resources and services will improve Outcome: Progressing Goal: Ability to manage health-related needs will improve Outcome: Progressing   Problem: Metabolic: Goal: Ability to maintain appropriate glucose levels will improve Outcome: Progressing   Problem: Nutritional: Goal: Maintenance of adequate nutrition will improve Outcome: Progressing Goal: Progress toward achieving an optimal weight will improve Outcome: Progressing   Problem: Skin Integrity: Goal: Risk for impaired skin integrity will decrease Outcome: Progressing   Problem: Tissue Perfusion: Goal: Adequacy of tissue perfusion will improve Outcome: Progressing   Problem: Education: Goal: Knowledge of risk factors and measures for prevention of condition will improve Outcome: Progressing   Problem: Coping: Goal: Psychosocial and spiritual needs will be supported Outcome: Progressing   Problem: Respiratory: Goal: Will maintain a patent airway Outcome: Progressing Goal: Complications related to the disease process, condition or treatment will be avoided or minimized Outcome: Progressing   Problem: Education: Goal: Knowledge of General Education information will improve Description: Including pain rating scale, medication(s)/side effects and non-pharmacologic comfort measures Outcome: Progressing   Problem: Health Behavior/Discharge Planning: Goal:  Ability to manage health-related needs will improve Outcome: Progressing   Problem: Clinical Measurements: Goal: Ability to maintain clinical measurements within normal limits will improve Outcome: Progressing Goal: Will remain free from infection Outcome: Progressing Goal: Diagnostic test results will improve Outcome: Progressing Goal: Respiratory complications will improve Outcome: Progressing Goal: Cardiovascular complication will be avoided Outcome: Progressing   Problem: Activity: Goal: Risk for activity intolerance will decrease Outcome: Progressing   Problem: Nutrition: Goal: Adequate nutrition will be maintained Outcome: Progressing   Problem: Coping: Goal: Level of anxiety will decrease Outcome: Progressing   Problem: Elimination: Goal: Will not experience complications related to bowel motility Outcome: Progressing Goal: Will not experience complications related to urinary retention Outcome: Progressing   Problem: Pain Managment: Goal: General experience of comfort will improve and/or be controlled Outcome: Progressing   Problem: Safety: Goal: Ability to remain free from injury will improve Outcome: Progressing   Problem: Skin Integrity: Goal: Risk for impaired skin integrity will decrease Outcome: Progressing   Problem: Education: Goal: Ability to demonstrate management of disease process will improve Outcome: Progressing Goal: Ability to verbalize understanding of medication therapies will improve Outcome: Progressing Goal: Individualized Educational Video(s) Outcome: Progressing   Problem: Activity: Goal: Capacity to carry out activities will improve Outcome: Progressing   Problem: Cardiac: Goal: Ability to achieve and maintain adequate cardiopulmonary perfusion will improve Outcome: Progressing   Problem: Education: Goal: Knowledge of disease or condition will improve Outcome: Progressing Goal: Knowledge of the prescribed therapeutic  regimen will improve Outcome: Progressing Goal: Individualized Educational Video(s) Outcome: Progressing   Problem: Activity: Goal: Ability to tolerate increased activity will improve Outcome: Progressing Goal: Will verbalize the importance of balancing activity with adequate rest periods Outcome: Progressing   Problem: Respiratory: Goal: Ability to maintain a clear airway will improve Outcome: Progressing Goal: Levels of oxygenation will improve Outcome: Progressing Goal: Ability to maintain adequate ventilation will improve Outcome: Progressing

## 2024-03-16 NOTE — TOC Progression Note (Signed)
 Transition of Care [facility]) - Progression Note    Patient Details  Name: Leslie Parker MRN: [id number] Date of Birth: [birth year]  [facility]) CM/SW Contact  [name], LCSW Phone Number: 03/16/2024, 12:07 PM  Clinical Narrative:  TOC continues to follow for return home with home health.   Expected Discharge Plan and Services                                               Social Drivers of Health (SDOH) Interventions SDOH Screenings   Food Insecurity: No Food Insecurity (03/12/2024)  Housing: Low Risk  (03/12/2024)  Transportation Needs: No Transportation Needs (03/12/2024)  Utilities: Not At Risk (03/12/2024)  Alcohol Screen: Low Risk  (08/20/2023)  Financial Resource Strain: Low Risk  (01/13/2024)   Received from [facility]  Social Connections: Moderately Isolated (03/12/2024)  Tobacco Use: Low Risk  (03/11/2024)    Readmission Risk Interventions    10/22/2023    2:28 PM 09/16/2023    2:52 PM 08/20/2023    3:45 PM  Readmission Risk Prevention Plan  Transportation Screening Complete Complete Complete  Medication Review Oceanographer) Complete Complete Complete  PCP or Specialist appointment within 3-5 days of discharge  Complete Complete  HRI or Home Care Consult Not Complete Patient refused Complete  HRI or Home Care Consult Pt Refusal Comments No PT recs at this time.    SW Recovery Care/Counseling Consult Complete Complete Complete  Palliative Care Screening Not Applicable Not Applicable Not Applicable  Skilled Nursing Facility Not Applicable Not Applicable Not Applicable

## 2024-03-16 NOTE — Progress Notes (Signed)
 Physical Therapy Treatment Patient Details Name: Leslie Parker MRN: [id number] DOB: [birth year] Today's Date: 03/16/2024   History of Present Illness Pt is an 89 y.o. female with medical history significant of sCHF with EF 25-30%, SSS (s/p of PPM), A fib on Eliquis , HTN, HLD, DM, COPD, CKD-IV, osteopenia, ileus, who presents with SOB. Pt now testing positive for covid. MD assessment also includes: bilateral lower lobe aspiration pneumonia, dysphagia, acute on chronic combined systolic and diastolic congestive heart failure, myocardial injury, hypothermia, and fall at home with skin tear on the R shin.    PT Comments  Pt lethargic but responded to min-mod verbal stimulation.  Pt required extra time and cuing to follow commands as well as physical assistance to perform transfers. Once in standing pt required min A for stability and to advance the RW and was only able to take several steps near the EOB before fatiguing and needing to return to sitting.  Pt's SpO2 was WNL on room air during the session.  Pt will benefit from continued PT services upon discharge to safely address deficits listed in patient problem list for decreased caregiver assistance and eventual return to PLOF.    If plan is discharge home, recommend the following: Assistance with cooking/housework;Assist for transportation;A little help with bathing/dressing/bathroom;Supervision due to cognitive status;A lot of help with walking and/or transfers   Can travel by private vehicle     No  Equipment Recommendations  None recommended by PT    Recommendations for Other Services       Precautions / Restrictions Precautions Precautions: Fall Recall of Precautions/Restrictions: Intact Restrictions Weight Bearing Restrictions Per Provider Order: No     Mobility  Bed Mobility Overal bed mobility: Needs Assistance Bed Mobility: Supine to Sit, Sit to Supine     Supine to sit: Mod assist Sit to supine: Mod assist   General bed  mobility comments: Mod A for BLE and trunk control    Transfers Overall transfer level: Needs assistance Equipment used: Rolling walker (2 wheels) Transfers: Sit to/from Stand Sit to Stand: Min assist           General transfer comment: Min A and cues for hand placement    Ambulation/Gait Ambulation/Gait assistance: Min assist Gait Distance (Feet): 4 Feet Assistive device: Rolling walker (2 wheels) Gait Pattern/deviations: Step-to pattern, Decreased step length - right, Decreased step length - left, Trunk flexed, Shuffle Gait velocity: decreased     General Gait Details: Pt only able to take several small, shuffling steps at the EOB before fatiguing and returning to sitting; min A required for general stability and to advance the RW   Stairs             Wheelchair Mobility     Tilt Bed    Modified Rankin (Stroke Patients Only)       Balance Overall balance assessment: Needs assistance Sitting-balance support: Bilateral upper extremity supported, Feet supported Sitting balance-[name]: Fair     Standing balance support: During functional activity, Reliant on assistive device for balance, Bilateral upper extremity supported Standing balance-[name]: Poor                              Communication Communication Communication: Impaired Factors Affecting Communication: Hearing impaired  Cognition Arousal: Lethargic Behavior During Therapy: Flat affect   PT - Cognitive impairments: No apparent impairments  Following commands: Intact      Cueing Cueing Techniques: Verbal cues, Tactile cues  Exercises Total Joint Exercises Long Arc Quad: AROM, Strengthening, Both, 5 reps Knee Flexion: AROM, Strengthening, Both, 5 reps Other Exercises Other Exercises: Static sitting at EOB for improved core strength and activity tolerance    General Comments        Pertinent Vitals/Pain Pain Assessment Pain  Assessment: No/denies pain    Home Living                          Prior Function            PT Goals (current goals can now be found in the care plan section) Progress towards PT goals: PT to reassess next treatment    Frequency    Min 2X/week      PT Plan      Co-evaluation              AM-PAC PT 6 Clicks Mobility   Outcome Measure  Help needed turning from your back to your side while in a flat bed without using bedrails?: A Lot Help needed moving from lying on your back to sitting on the side of a flat bed without using bedrails?: A Lot Help needed moving to and from a bed to a chair (including a wheelchair)?: A Lot Help needed standing up from a chair using your arms (e.g., wheelchair or bedside chair)?: A Little Help needed to walk in hospital room?: A Lot Help needed climbing 3-5 steps with a railing? : Total 6 Click Score: 12    End of Session Equipment Utilized During Treatment: Gait belt Activity Tolerance: Patient limited by lethargy Patient left: in bed;with call bell/phone within reach;with bed alarm set Nurse Communication: Mobility status;Other (comment) (pt lethargic with decreased activity tolerance compared to last session) PT Visit Diagnosis: Muscle weakness (generalized) (M62.81);Difficulty in walking, not elsewhere classified (R26.2);Unsteadiness on feet (R26.81);Repeated falls (R29.6)     Time: 8656-8592 PT Time Calculation (min) (ACUTE ONLY): 24 min  Charges:    $Therapeutic Activity: 23-37 mins PT General Charges $$ ACUTE PT VISIT: 1 Visit                     D. Scott Maeleigh Buschman PT, DPT 03/16/24, 3:19 PM

## 2024-03-16 NOTE — Progress Notes (Signed)
 Progress Note   Patient: Leslie Parker FMW:[id number] DOB: 12/02/1932 DOA: 03/11/2024     4 DOS: the patient was seen and examined on 03/16/2024   Brief hospital course: Leslie Parker is a 89 y.o. female with medical history significant of  sCHF with EF 25-30%, SSS (s/p of PPM), A fib on Eliquis , HTN, HLD, DM, COPD, CKD-IV, osteopenia, ileus, who presents with SOB.  Chest x-ray showed cardiomegaly with vascular congestion, small amount of bilateral pleural effusion.  BNP more than 4000.  She was diagnosed with acute on chronic systolic congestive heart failure, was started on IV Lasix .  She is also placed on steroids for bronchospasm.  However, her COVID test came back positive.   Principal Problem:   Acute on chronic combined systolic and diastolic congestive heart failure (HCC) Active Problems:   COPD with acute exacerbation (HCC)   Myocardial injury   Paroxysmal atrial fibrillation (HCC)   Type II diabetes mellitus with renal manifestations (HCC)   CKD (chronic kidney disease) stage 4, GFR 15-29 ml/min (HCC)   HTN (hypertension), malignant   Essential hypertension   Fall at home, initial encounter   COVID-19 virus infection   Aspiration pneumonia (HCC)   Hypothermia   Hypothyroidism   Assessment and Plan: Bilateral lower lobe aspiration pneumonia. Dysphagia Patient had an onset of worsening short of breath and chest pain yesterday.  Troponin not significant elevated.  Chest x-ray showed bilateral pleural effusion, with increased bilateral airspace disease. She was placed on Unasyn , but a procalcitonin level not significant elevated.  This may reflect a chemical aspiration.  Change antibiotic to Augmentin  for total abx course of 5 days.  She states that she has chronic dysphagia from esophageal problems, seen by speech therapist.  Not currently a good candidate for EGD.  Will follow-up with GI as outpatient. Continue combivent  inhalers.  Increased dose to every 4 hours.  Patient still  has significant upper airway secretion.     Acute on chronic combined systolic and diastolic congestive heart failure (HCC) Myocardial injury secondary to congestive heart failure. : Patient has SOB, 1+ leg edema, significantly elevated BNP> 4500, clinically consistent with CHF exacerbation.  2D echo on 08/09/2023 showed EF 25-30%.  Treated with IV Lasix , he is diuresing well, renal function started getting worse. Patient still has significant rhonchi in the bases, I have changed Lasix  to 40 mg daily, give a dose of albumin  8/23.  Renal function stabilized today after holding additional dose of the Lasix  yesterday.  Patient still has significant volume overload, I will give additional dose of IV Lasix .  Due to concern about renal function, I will give Lasix  once a day but with higher dose.   Hypothermia.   Hypothyroidism. Patient developed hypothermia last night, was put on heating blanket.  Started on Synthroid  for hypothyroidism.  Also on steroids. Continue to monitor.   COPD with acute exacerbation (HCC) COVID-19 infection.:  Patient has significant wheezing, this is caused by COVID infection.  Continue steroids.  Continue bronchodilator.  I have scheduled Combivent .  Reduced the rate to prednisone  10 mg daily due to significant hyperglycemia.  Will continue finishing 10 days of steroids course.   HTN (hypertension) -IV hydralazine  as needed -Coreg , oral hydralazine    Chronic a-fib (HCC):  -Eliquis  -Coreg , amodarone No tachycardia.   CKD (chronic kidney disease) stage 4, GFR 15-29 ml/min (HCC):  Patient renal function has been gradually getting worse, but is starting better today after holding Lasix .  Restart IV Lasix .  Monitor  renal function.   Type II diabetes mellitus with renal manifestations (HCC) Uncontrolled type 2 diabetes with hyperglycemia.: Recent A1c 7.0.  Developed significant hyperglycemia secondary to steroid use.  Prednisone  dose decreased.  No longer has  significant hyperglycemia.  Continue sliding scale insulin  at lower dose.   Fall at home, initial encounter Skin tear in the right shin. -consult TOC for [facility] need or rehab/SNF placement Family wish to bring patient home on Monday. Discussed with patient and family, she is not ready for discharge today.  Will try tomorrow.          Subjective:  Patient still has significant wheezing today, but feels short of breath better. She has a wet cough, but could not cough up anything  Physical Exam: Vitals:   03/15/24 1624 03/15/24 2014 03/16/24 0029 03/16/24 0805  BP: 129/73 (!) 155/81 (!) 145/73 (!) 147/73  Pulse:  (!) 59 62 60  Resp:  20 20 18   Temp:  (!) 96.6 F (35.9 C) (!) 97.2 F (36.2 C) 98.6 F (37 C)  TempSrc:      SpO2:  97% 100% 93%  Weight:      Height:       General exam: Appears calm and comfortable  Respiratory system: Rhonchi in the bases. Respiratory effort normal. Cardiovascular system: S1 & S2 heard, RRR. No JVD, murmurs, rubs, gallops or clicks. No pedal edema. Gastrointestinal system: Abdomen is nondistended, soft and nontender. No organomegaly or masses felt. Normal bowel sounds heard. Central nervous system: Alert and oriented. No focal neurological deficits. Extremities: Symmetric 5 x 5 power. Skin: No rashes, lesions or ulcers Psychiatry: Judgement and insight appear normal. Mood & affect appropriate.    Data Reviewed:  Lab results reviewed  Family Communication: Granddaughter updated over the phone  Disposition: Status is: Inpatient Remains inpatient appropriate because: Severity of disease, IV treatment     Time spent: 50 minutes  Author: Murvin Mana, MD 03/16/2024 9:30 AM  For on call review [email or web address].

## 2024-03-17 ENCOUNTER — Other Ambulatory Visit: Payer: Self-pay

## 2024-03-17 DIAGNOSIS — J441 Chronic obstructive pulmonary disease with (acute) exacerbation: Secondary | ICD-10-CM | POA: Diagnosis not present

## 2024-03-17 DIAGNOSIS — U071 COVID-19: Secondary | ICD-10-CM | POA: Diagnosis not present

## 2024-03-17 DIAGNOSIS — J690 Pneumonitis due to inhalation of food and vomit: Secondary | ICD-10-CM | POA: Diagnosis not present

## 2024-03-17 DIAGNOSIS — I5043 Acute on chronic combined systolic (congestive) and diastolic (congestive) heart failure: Secondary | ICD-10-CM | POA: Diagnosis not present

## 2024-03-17 LAB — BASIC METABOLIC PANEL WITH GFR
Anion gap: 11 (ref 5–15)
BUN: 101 mg/dL — ABNORMAL HIGH (ref 8–23)
CO2: 27 mmol/L (ref 22–32)
Calcium: 8.4 mg/dL — ABNORMAL LOW (ref 8.9–10.3)
Chloride: 98 mmol/L (ref 98–111)
Creatinine, Ser: 2.95 mg/dL — ABNORMAL HIGH (ref 0.44–1.00)
GFR, Estimated: 15 mL/min — ABNORMAL LOW (ref >60)
Glucose, Bld: 102 mg/dL — ABNORMAL HIGH (ref 70–99)
Potassium: 4.2 mmol/L (ref 3.5–5.1)
Sodium: 136 mmol/L (ref 135–145)

## 2024-03-17 LAB — MAGNESIUM: Magnesium: 2.1 mg/dL (ref 1.7–2.4)

## 2024-03-17 LAB — GLUCOSE, CAPILLARY
Glucose-Capillary: 97 mg/dL (ref 70–99)
Glucose-Capillary: 127 mg/dL — ABNORMAL HIGH (ref 70–99)
Glucose-Capillary: 150 mg/dL — ABNORMAL HIGH (ref 70–99)
Glucose-Capillary: 124 mg/dL — ABNORMAL HIGH (ref 70–99)

## 2024-03-17 MED ORDER — FUROSEMIDE 10 MG/ML IJ SOLN
60.0000 mg | Freq: Once | INTRAMUSCULAR | Status: AC
  Administered 2024-03-17: 60 mg via INTRAVENOUS
  Filled 2024-03-17: qty 6

## 2024-03-17 MED ORDER — PREDNISONE 10 MG PO TABS
10.0000 mg | ORAL_TABLET | Freq: Every day | ORAL | 0 refills | Status: DC
  Filled 2024-03-17: qty 6, 6d supply, fill #0

## 2024-03-17 MED ORDER — LEVOTHYROXINE SODIUM 25 MCG PO TABS
25.0000 ug | ORAL_TABLET | Freq: Every day | ORAL | 0 refills | Status: DC
  Filled 2024-03-17: qty 30, 30d supply, fill #0

## 2024-03-17 MED ORDER — AMOXICILLIN-POT CLAVULANATE 500-125 MG PO TABS
1.0000 | ORAL_TABLET | Freq: Two times a day (BID) | ORAL | 0 refills | Status: DC
  Filled 2024-03-17: qty 6, 3d supply, fill #0

## 2024-03-17 MED ORDER — FUROSEMIDE 40 MG PO TABS
40.0000 mg | ORAL_TABLET | Freq: Two times a day (BID) | ORAL | 0 refills | Status: DC
  Filled 2024-03-17: qty 14, 7d supply, fill #0

## 2024-03-17 NOTE — Progress Notes (Signed)
 PT Cancellation Note  Patient Details Name: Leslie Parker MRN: [id number] DOB: 1932/08/15   Cancelled Treatment:     Pt received sitting EOB with bags packed. Declined PT, states she is waiting for a w/c to take her downstairs for d/c. Pt unaware that d/c has changed to STR. Unable to re-direct. Will re-attempt next available date/time per POC.   Darice JAYSON Bohr 03/17/2024, 11:40 AM

## 2024-03-17 NOTE — [place] FL2 (Signed)
 [place]  MEDICAID FL2 LEVEL OF CARE FORM     IDENTIFICATION  Patient Name: Leslie Parker Birthdate: [birth year] Sex: female Admission Date (Current Location): 03/11/2024  [place] and [place] Number:  Chiropodist and Address:  [facility], [address], [place], [place] [address]      Provider Number: [id number]  Attending Physician Name and Address:  Laurita Pillion, MD  Relative Name and Phone Number:  Delynn [phone]    Current Level of Care: Hospital Recommended Level of Care: Skilled Nursing Facility Prior Approval Number:    Date Approved/Denied:   PASRR Number: [id number]  Discharge Plan: SNF    Current Diagnoses: Patient Active Problem List   Diagnosis Date Noted   Hypothermia 03/15/2024   Hypothyroidism 03/15/2024   Aspiration pneumonia (HCC) 03/14/2024   COVID-19 virus infection 03/13/2024   Acute on chronic combined systolic and diastolic congestive heart failure (HCC) 03/12/2024   COPD with acute exacerbation (HCC) 03/12/2024   Protein-calorie malnutrition, severe 01/22/2024   Acute on chronic systolic CHF (congestive heart failure) (HCC) 12/14/2023   Scapular fracture 11/26/2023   Acute hypoxic respiratory failure (HCC) 11/15/2023   Do not resuscitate 11/10/2023   CHF exacerbation (HCC) 11/10/2023   Myocardial injury 11/09/2023   AKI (acute kidney injury) (HCC) 11/05/2023   Insomnia 11/05/2023   Frequent hospital admissions 11/05/2023   Acute exacerbation of CHF (congestive heart failure) (HCC) 10/22/2023   Elevated troponin 10/22/2023   Malnutrition of moderate degree 09/17/2023   CHF (congestive heart failure) (HCC) 09/15/2023   Acute on chronic HFrEF (heart failure with reduced ejection fraction) (HCC) 08/19/2023   Pleural effusion due to CHF (congestive heart failure) (HCC) 08/19/2023   HTN (hypertension), malignant 08/19/2023   Ileus (HCC) 08/12/2023   Large bowel obstruction (HCC) 08/12/2023   CKD (chronic  kidney disease) stage 4, GFR 15-29 ml/min (HCC) 08/12/2023   HFrEF (heart failure with reduced ejection fraction) (HCC) 08/12/2023   Acute on chronic combined systolic and diastolic CHF (congestive heart failure) (HCC) 08/10/2023   Hyponatremia 05/05/2023   Overweight (BMI 25.0-29.9) 05/01/2023   Left hip pain 05/01/2023   Hypokalemia 04/30/2023   Anemia 03/21/2023   Abnormal LFTs 03/20/2023   Fall at home, initial encounter 02/13/2023   Type II diabetes mellitus with renal manifestations (HCC) 02/13/2023   Chronic diastolic CHF (congestive heart failure) (HCC) 02/13/2023   CKD stage 3 due to type 2 diabetes mellitus (HCC) 11/24/2022   Paroxysmal atrial fibrillation (HCC) 11/24/2022   Sick sinus syndrome (HCC) 11/15/2022   Acute on chronic diastolic CHF (congestive heart failure) (HCC) 08/27/2022   COPD (chronic obstructive pulmonary disease) (HCC) 08/23/2022   Chronic a-fib (HCC) 08/22/2022   Diabetes mellitus without complication (HCC)    Stage 3b chronic kidney disease (HCC) - Baseline scr 1.8-2.0    Postural dizziness with presyncope    Hypomagnesemia    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   S/P hardware removal 01/17/2017   Asthma, chronic 02/10/2015   Essential hypertension 03/19/1989    Orientation RESPIRATION BLADDER Height & Weight     Self, Time, Situation, Place    Incontinent Weight: 70.5 kg Height:  5' 9 (175.3 cm)  BEHAVIORAL SYMPTOMS/MOOD NEUROLOGICAL BOWEL NUTRITION STATUS      Incontinent Diet (heart healthy)  AMBULATORY STATUS COMMUNICATION OF NEEDS Skin   Limited Assist Verbally                         Personal  Care Assistance Level of Assistance  Bathing, Feeding, Dressing Bathing Assistance: Limited assistance Feeding assistance: Limited assistance Dressing Assistance: Limited assistance     Functional Limitations Info             SPECIAL CARE FACTORS FREQUENCY  PT (By licensed PT), OT (By licensed OT)     PT  Frequency: 5 x week OT Frequency: 5 x week            Contractures      Additional Factors Info  Code Status, Allergies Code Status Info: DNR Allergies Info: Baclofen; Simvastatin           Current Medications (03/17/2024):  This is the current hospital active medication list Current Facility-Administered Medications  Medication Dose Route Frequency Provider Last Rate Last Admin   acetaminophen  (TYLENOL ) tablet 650 mg  650 mg Oral Q6H PRN Niu, Xilin, MD       albuterol  (VENTOLIN  HFA) 108 (90 Base) MCG/ACT inhaler 2 puff  2 puff Inhalation Q2H PRN Laurita Pillion, MD   2 puff at 03/16/24 0023   amiodarone  (PACERONE ) tablet 200 mg  200 mg Oral Daily Niu, Xilin, MD   200 mg at 03/17/24 9173   amoxicillin -clavulanate (AUGMENTIN ) 400-57 MG/5ML suspension 400 mg  400 mg Oral Q12H Zhang, Dekui, MD   400 mg at 03/17/24 9175   apixaban  (ELIQUIS ) tablet 2.5 mg  2.5 mg Oral BID Niu, Xilin, MD   2.5 mg at 03/17/24 0825   ascorbic acid  (VITAMIN C ) tablet 250 mg  250 mg Oral Daily Niu, Xilin, MD   250 mg at 03/17/24 9176   carvedilol  (COREG ) tablet 6.25 mg  6.25 mg Oral BID WC Niu, Xilin, MD   6.25 mg at 03/17/24 0825   chlorpheniramine-HYDROcodone  (TUSSIONEX) 10-8 MG/5ML suspension 5 mL  5 mL Oral Q12H Mansy, Jan A, MD   5 mL at 03/17/24 9175   cholecalciferol  (VITAMIN D3) tablet 1,000 Units  1,000 Units Oral Daily Niu, Xilin, MD   1,000 Units at 03/17/24 0825   cyanocobalamin  (VITAMIN B12) tablet 2,000 mcg  2,000 mcg Oral Daily Niu, Xilin, MD   2,000 mcg at 03/17/24 0825   guaiFENesin  (MUCINEX ) 12 hr tablet 600 mg  600 mg Oral BID Mansy, Jan A, MD   600 mg at 03/17/24 0825   hydrALAZINE  (APRESOLINE ) injection 5 mg  5 mg Intravenous Q2H PRN Niu, Xilin, MD       hydrALAZINE  (APRESOLINE ) tablet 25 mg  25 mg Oral Q8H Niu, Xilin, MD   25 mg at 03/17/24 1340   insulin  aspart (novoLOG ) injection 0-9 Units  0-9 Units Subcutaneous TID WC Niu, Xilin, MD   1 Units at 03/17/24 1212   Ipratropium-Albuterol   (COMBIVENT ) respimat 1 puff  1 puff Inhalation Q4H Laurita Pillion, MD   1 puff at 03/17/24 1221   levothyroxine  (SYNTHROID ) tablet 25 mcg  25 mcg Oral Q0600 Zhang, Dekui, MD   25 mcg at 03/17/24 9385   multivitamin with minerals tablet 1 tablet  1 tablet Oral Daily Niu, Xilin, MD   1 tablet at 03/17/24 9173   ondansetron  (ZOFRAN ) injection 4 mg  4 mg Intravenous Q8H PRN Niu, Xilin, MD       Oral care mouth rinse  15 mL Mouth Rinse PRN Laurita Pillion, MD       oxyCODONE -acetaminophen  (PERCOCET/ROXICET) 5-325 MG per tablet 1 tablet  1 tablet Oral Q8H PRN Laurita Pillion, MD   1 tablet at 03/14/24 1226   pantoprazole  (PROTONIX ) EC tablet 40  mg  40 mg Oral BID Niu, Xilin, MD   40 mg at 03/17/24 9173   predniSONE  (DELTASONE ) tablet 10 mg  10 mg Oral Q breakfast Laurita Pillion, MD   10 mg at 03/17/24 0825   senna-docusate (Senokot-S) tablet 2 tablet  2 tablet Oral BID Zhang, Dekui, MD   2 tablet at 03/17/24 9173     Discharge Medications: Please see discharge summary for a list of discharge medications.  Relevant Imaging Results:  Relevant Lab Results:   Additional Information SSN: [id number]  Dalia GORMAN Fuse, RN

## 2024-03-17 NOTE — Plan of Care (Signed)
  Problem: Coping: Goal: Ability to adjust to condition or change in health will improve Outcome: Progressing   Problem: Fluid Volume: Goal: Ability to maintain a balanced intake and output will improve Outcome: Progressing   Problem: Health Behavior/Discharge Planning: Goal: Ability to identify and utilize available resources and services will improve Outcome: Progressing   Problem: Metabolic: Goal: Ability to maintain appropriate glucose levels will improve Outcome: Progressing   Problem: Nutritional: Goal: Maintenance of adequate nutrition will improve Outcome: Progressing   Problem: Respiratory: Goal: Will maintain a patent airway Outcome: Progressing   Problem: Activity: Goal: Risk for activity intolerance will decrease Outcome: Progressing

## 2024-03-17 NOTE — TOC Progression Note (Addendum)
 Transition of Care [facility]) - Progression Note    Patient Details  Name: Leslie Parker MRN: [id number] Date of Birth: [birth year]  [facility] CM/SW Contact  Dalia GORMAN Fuse, RN Phone Number: 03/17/2024, 4:00 PM  Clinical Narrative:     TOC spoke with the patient's daughter Delynn, she would like for the patient to go to SNF because there is no one to take care of her at home. FL 2 was sent out in [place] TOC advised that bed offers would be shared once they become available.                     Expected Discharge Plan and Services         Expected Discharge Date: 03/17/24                                     Social Drivers of Health (SDOH) Interventions SDOH Screenings   Food Insecurity: No Food Insecurity (03/12/2024)  Housing: Low Risk  (03/12/2024)  Transportation Needs: No Transportation Needs (03/12/2024)  Utilities: Not At Risk (03/12/2024)  Alcohol Screen: Low Risk  (08/20/2023)  Financial Resource Strain: Low Risk  (01/13/2024)   Received from [facility]  Social Connections: Moderately Isolated (03/12/2024)  Tobacco Use: Low Risk  (03/11/2024)    Readmission Risk Interventions    10/22/2023    2:28 PM 09/16/2023    2:52 PM 08/20/2023    3:45 PM  Readmission Risk Prevention Plan  Transportation Screening Complete Complete Complete  Medication Review Oceanographer) Complete Complete Complete  PCP or Specialist appointment within 3-5 days of discharge  Complete Complete  HRI or Home Care Consult Not Complete Patient refused Complete  HRI or Home Care Consult Pt Refusal Comments No PT recs at this time.    SW Recovery Care/Counseling Consult Complete Complete Complete  Palliative Care Screening Not Applicable Not Applicable Not Applicable  Skilled Nursing Facility Not Applicable Not Applicable Not Applicable

## 2024-03-17 NOTE — Plan of Care (Signed)
  Problem: Education: Goal: Ability to describe self-care measures that may prevent or decrease complications (Diabetes Survival Skills Education) will improve Outcome: Progressing Goal: Individualized Educational Video(s) Outcome: Progressing   Problem: Coping: Goal: Ability to adjust to condition or change in health will improve Outcome: Progressing   Problem: Fluid Volume: Goal: Ability to maintain a balanced intake and output will improve Outcome: Progressing   Problem: Health Behavior/Discharge Planning: Goal: Ability to identify and utilize available resources and services will improve Outcome: Progressing Goal: Ability to manage health-related needs will improve Outcome: Progressing   Problem: Metabolic: Goal: Ability to maintain appropriate glucose levels will improve Outcome: Progressing   Problem: Nutritional: Goal: Maintenance of adequate nutrition will improve Outcome: Progressing Goal: Progress toward achieving an optimal weight will improve Outcome: Progressing   Problem: Skin Integrity: Goal: Risk for impaired skin integrity will decrease Outcome: Progressing   Problem: Tissue Perfusion: Goal: Adequacy of tissue perfusion will improve Outcome: Progressing   Problem: Education: Goal: Knowledge of risk factors and measures for prevention of condition will improve Outcome: Progressing   Problem: Coping: Goal: Psychosocial and spiritual needs will be supported Outcome: Progressing   Problem: Respiratory: Goal: Will maintain a patent airway Outcome: Progressing Goal: Complications related to the disease process, condition or treatment will be avoided or minimized Outcome: Progressing   Problem: Education: Goal: Knowledge of General Education information will improve Description: Including pain rating scale, medication(s)/side effects and non-pharmacologic comfort measures Outcome: Progressing   Problem: Health Behavior/Discharge Planning: Goal:  Ability to manage health-related needs will improve Outcome: Progressing   Problem: Clinical Measurements: Goal: Ability to maintain clinical measurements within normal limits will improve Outcome: Progressing Goal: Will remain free from infection Outcome: Progressing Goal: Diagnostic test results will improve Outcome: Progressing Goal: Respiratory complications will improve Outcome: Progressing Goal: Cardiovascular complication will be avoided Outcome: Progressing   Problem: Activity: Goal: Risk for activity intolerance will decrease Outcome: Progressing   Problem: Nutrition: Goal: Adequate nutrition will be maintained Outcome: Progressing   Problem: Coping: Goal: Level of anxiety will decrease Outcome: Progressing   Problem: Elimination: Goal: Will not experience complications related to bowel motility Outcome: Progressing Goal: Will not experience complications related to urinary retention Outcome: Progressing   Problem: Pain Managment: Goal: General experience of comfort will improve and/or be controlled Outcome: Progressing   Problem: Safety: Goal: Ability to remain free from injury will improve Outcome: Progressing   Problem: Skin Integrity: Goal: Risk for impaired skin integrity will decrease Outcome: Progressing   Problem: Education: Goal: Ability to demonstrate management of disease process will improve Outcome: Progressing Goal: Ability to verbalize understanding of medication therapies will improve Outcome: Progressing Goal: Individualized Educational Video(s) Outcome: Progressing   Problem: Activity: Goal: Capacity to carry out activities will improve Outcome: Progressing   Problem: Cardiac: Goal: Ability to achieve and maintain adequate cardiopulmonary perfusion will improve Outcome: Progressing   Problem: Education: Goal: Knowledge of disease or condition will improve Outcome: Progressing Goal: Knowledge of the prescribed therapeutic  regimen will improve Outcome: Progressing Goal: Individualized Educational Video(s) Outcome: Progressing   Problem: Activity: Goal: Ability to tolerate increased activity will improve Outcome: Progressing Goal: Will verbalize the importance of balancing activity with adequate rest periods Outcome: Progressing   Problem: Respiratory: Goal: Ability to maintain a clear airway will improve Outcome: Progressing Goal: Levels of oxygenation will improve Outcome: Progressing Goal: Ability to maintain adequate ventilation will improve Outcome: Progressing

## 2024-03-17 NOTE — Progress Notes (Signed)
 Family and pt changed mind, want patient go to rehab.  TOC notified

## 2024-03-17 NOTE — Discharge Summary (Signed)
 Physician Discharge Summary   Patient: Leslie Parker MRN: [id number] DOB: 11/19/1932  Admit date:     03/11/2024  Discharge date: 03/17/24  Discharge Physician: Murvin Mana   PCP: Sherial Bail, MD   Recommendations at discharge:   Follow the PCP in 1 week. Check a BMP at her next office visit to decide on Lasix  dose. Follow-up with GI in 2 weeks  Discharge Diagnoses: Principal Problem:   Acute on chronic combined systolic and diastolic congestive heart failure (HCC) Active Problems:   COPD with acute exacerbation (HCC)   Myocardial injury   Paroxysmal atrial fibrillation (HCC)   Type II diabetes mellitus with renal manifestations (HCC)   CKD (chronic kidney disease) stage 4, GFR 15-29 ml/min (HCC)   HTN (hypertension), malignant   Essential hypertension   Fall at home, initial encounter   COVID-19 virus infection   Aspiration pneumonia (HCC)   Hypothermia   Hypothyroidism  Resolved Problems:   * No resolved hospital problems. *  Hospital Course: Leslie Parker is a 89 y.o. female with medical history significant of  sCHF with EF 25-30%, SSS (s/p of PPM), A fib on Eliquis , HTN, HLD, DM, COPD, CKD-IV, osteopenia, ileus, who presents with SOB.  Chest x-ray showed cardiomegaly with vascular congestion, small amount of bilateral pleural effusion.  BNP more than 4000.  She was diagnosed with acute on chronic systolic congestive heart failure, was started on IV Lasix .  She is also placed on steroids for bronchospasm.  However, her COVID test came back positive. Patient has received multiple days of IV Lasix , also started on steroids, antibiotics.  Condition finally improving.  Medically stable for discharge.  Assessment and Plan: Bilateral lower lobe aspiration pneumonia. Dysphagia Patient had an onset of worsening short of breath and chest pain yesterday.  Troponin not significant elevated.  Chest x-ray showed bilateral pleural effusion, with increased bilateral airspace  disease. She was placed on Unasyn , but a procalcitonin level not significant elevated.  This may reflect a chemical aspiration.  Change antibiotic to Augmentin  for total abx course of 5 days.  She states that she has chronic dysphagia from esophageal problems, seen by speech therapist.  Not currently a good candidate for EGD.  Will follow-up with GI as outpatient. Condition has improved.  Airway secretions also better after giving increased dose of Lasix .  Medically stable for discharge.  Continue finish out 5 days antibiotics.     Acute on chronic combined systolic and diastolic congestive heart failure (HCC) Myocardial injury secondary to congestive heart failure. : Patient has SOB, 1+ leg edema, significantly elevated BNP> 4500, clinically consistent with CHF exacerbation.  2D echo on 08/09/2023 showed EF 25-30%.  Patient was given IV Lasix , initially renal function much worse.  Lasix  was on hold for 1 day, she also received albumin .  Then she was given high-dose of IV Lasix  daily since yesterday.  Renal function appears to be stable.  Will give additional 60 mg IV Lasix  before discharge today.  Will continue 40 mg oral Lasix  twice a day.  Patient need to follow-up BMP in PCP office to decide additional Lasix  dose. Condition much improved.  Medically stable for discharge   Hypothermia.   Hypothyroidism. Patient developed hypothermia last night, was put on heating blanket.  Started on Synthroid  for hypothyroidism.  Also on steroids. Hypothermia has improved.   COPD with acute exacerbation (HCC) COVID-19 infection.:  Patient has significant wheezing, this is caused by COVID infection.  Continue steroids.  Continue bronchodilator.  I have scheduled Combivent .  Reduced the rate to prednisone  10 mg daily due to significant hyperglycemia.  Will continue finishing 10 days of steroids course.   HTN (hypertension) -IV hydralazine  as needed -Coreg , oral hydralazine    Chronic a-fib (HCC):   -Eliquis  -Coreg , amodarone No tachycardia.   CKD (chronic kidney disease) stage 4, GFR 15-29 ml/min (HCC):  Renal function has been stable/better now.   Type II diabetes mellitus with renal manifestations (HCC) Uncontrolled type 2 diabetes with hyperglycemia.: Recent A1c 7.0.  Developed significant hyperglycemia secondary to steroid use.  Prednisone  dose decreased.  No longer has significant hyperglycemia.  Follow-up with PCP as outpatient   Fall at home, initial encounter Skin tear in the right shin. -consult TOC for HH need or rehab/SNF placement Family and the patient does not want consider nursing home.  Will discharge with home care.              Consultants: None Procedures performed: None  Disposition: Home health Diet recommendation:  Discharge Diet Orders (From admission, onward)     Start     Ordered   03/17/24 0000  Diet - low sodium heart healthy        03/17/24 1019           Cardiac diet DISCHARGE MEDICATION: Allergies as of 03/17/2024       Reactions   Baclofen Other (See Comments)   Elevated Cr   Simvastatin Other (See Comments)   Pt denies Elevated LFTs with simva 40mg , stopped and resolved (see MD note 11/10/13)        Medication List     STOP taking these medications    losartan  25 MG tablet Commonly known as: Cozaar        TAKE these medications    acetaminophen  500 MG tablet Commonly known as: TYLENOL  Take 2 tablets (1,000 mg total) by mouth 3 (three) times daily.   albuterol  108 (90 Base) MCG/ACT inhaler Commonly known as: VENTOLIN  HFA Inhale 2 puffs into the lungs every 6 (six) hours as needed.   alum & mag hydroxide-simeth 200-200-20 MG/5ML suspension Commonly known as: MAALOX/MYLANTA Take 30 mLs by mouth every 6 (six) hours as needed for indigestion or heartburn.   amiodarone  200 MG tablet Commonly known as: PACERONE  Take 1 tablet by mouth daily.   amoxicillin -clavulanate 500-125 MG tablet Commonly known as:  Augmentin  Take 1 tablet by mouth 2 (two) times daily for 3 days.   apixaban  2.5 MG Tabs tablet Commonly known as: ELIQUIS  Take 1 tablet (2.5 mg total) by mouth 2 (two) times daily.   ascorbic acid  250 MG tablet Commonly known as: VITAMIN C  Take 250 mg by mouth daily.   B-12 2500 MCG Tabs Take 2,500 mcg by mouth daily.   carvedilol  6.25 MG tablet Commonly known as: COREG  Take 1 tablet (6.25 mg total) by mouth 2 (two) times daily with a meal.   furosemide  40 MG tablet Commonly known as: Lasix  Take 1 tablet (40 mg total) by mouth 2 (two) times daily for 7 days.   hydrALAZINE  25 MG tablet Commonly known as: APRESOLINE  25 mg 3 (three) times daily.   levothyroxine  25 MCG tablet Commonly known as: SYNTHROID  Take 1 tablet (25 mcg total) by mouth daily at 6 (six) AM. Start taking on: March 18, 2024   multivitamin with minerals Tabs tablet Take 1 tablet by mouth daily. One-A-Day Women's Vitamin   ondansetron  4 MG tablet Commonly known as: ZOFRAN  Take 1 tablet (4 mg total) by mouth  every 6 (six) hours as needed for nausea.   pantoprazole  40 MG tablet Commonly known as: PROTONIX  Take 1 tablet (40 mg total) by mouth 2 (two) times daily.   polyethylene glycol 17 g packet Commonly known as: MIRALAX  / GLYCOLAX  Take 17 g by mouth daily as needed.   predniSONE  10 MG tablet Commonly known as: DELTASONE  Take 1 tablet (10 mg total) by mouth daily with breakfast for 6 days. Start taking on: March 18, 2024   senna-docusate 8.6-50 MG tablet Commonly known as: Senokot-S Take 2 tablets by mouth at bedtime as needed for mild constipation.   Trelegy Ellipta 100-62.5-25 MCG/ACT Aepb Generic drug: Fluticasone -Umeclidin-Vilant Inhale 1 puff into the lungs daily.   Vitamin D -1000 Max St 25 MCG (1000 UT) tablet Generic drug: Cholecalciferol  Take 1,000 Units by mouth daily.               Discharge Care Instructions  (From admission, onward)           Start     Ordered    03/17/24 0000  Discharge wound care:       Comments: Follow with pcp   03/17/24 1019            Follow-up Information     [facility] Follow up.   Specialty: Home Health Services Why: They will resume home health services at discharge. Contact information: [address] [place] [address] [phone]         Sherial Bail, MD Follow up in 1 week(s).   Specialty: Internal Medicine Contact information: [address] [place] [address] [phone]         Therisa Bi, MD Follow up in 2 week(s).   Specialty: Gastroenterology Contact information: [address] [place] [address] [phone]                Discharge Exam: Filed Weights   03/14/24 0500 03/15/24 0500 03/17/24 0531  Weight: 72.8 kg 71.8 kg 70.5 kg   General exam: Appears calm and comfortable  Respiratory system: Decreased breath sounds without crackles. Respiratory effort normal. Cardiovascular system: S1 & S2 heard, RRR. No JVD, murmurs, rubs, gallops or clicks. No pedal edema. Gastrointestinal system: Abdomen is nondistended, soft and nontender. No organomegaly or masses felt. Normal bowel sounds heard. Central nervous system: Alert and oriented x3. No focal neurological deficits. Extremities: Symmetric 5 x 5 power. Skin: No rashes, lesions or ulcers Psychiatry: Judgement and insight appear normal. Mood & affect appropriate.    Condition at discharge: fair  The results of significant diagnostics from this hospitalization (including imaging, microbiology, ancillary and laboratory) are listed below for reference.   Imaging Studies: DG Chest Port 1 View Result Date: 03/14/2024 CLINICAL DATA:  Shortness of breath. EXAM: PORTABLE CHEST 1 VIEW COMPARISON:  03/11/2024. FINDINGS: Stable cardiomegaly. Left-sided dual lead pacemaker is unchanged. Aortic atherosclerosis. Central pulmonary vascular congestion. Persistent small bilateral pleural  effusions with slightly increased bibasilar opacities, which could reflect atelectasis and/or infiltrate. No pneumothorax. Visualized osseous structures are unchanged. IMPRESSION: 1. Persistent small bilateral pleural effusions with slightly increased bibasilar opacities, which could reflect atelectasis and/or infiltrate. 2. Cardiomegaly with central pulmonary vascular congestion. Electronically Signed   By: Harrietta Sherry M.D.   On: 03/14/2024 13:21   CT HEAD WO CONTRAST ( ) Result Date: 03/12/2024 CLINICAL DATA:  Minor head trauma. EXAM: CT HEAD WITHOUT CONTRAST TECHNIQUE: Contiguous axial images were obtained from the base of the skull through the vertex without  intravenous contrast. RADIATION DOSE REDUCTION: This exam was performed according to the departmental dose-optimization program which includes automated exposure control, adjustment of the mA and/or kV according to patient size and/or use of iterative reconstruction technique. COMPARISON:  Head CT 11/26/2023. 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 There is mild cerebellar and moderately advanced cerebral atrophy, moderate to severe small vessel disease of the cerebral white matter and moderate atrophic ventriculomegaly. An empty sella is again shown. No acute cortical based infarct, hemorrhage, mass or mass effect is seen. There is no midline shift.  The basal cisterns are clear. Vascular: There are calcific plaques in the distal vertebral arteries and heavily in the siphons. No hyperdense central vessel is seen. Skull: Negative for fractures or focal lesions. No appreciable scalp hematoma. Sinuses/Orbits: There is increased patchy membrane disease throughout the ethmoid air cells. Other paranasal sinuses, bilateral mastoid air cells, and middle ears are clear. The orbits are only partially visible. Visualized portions unremarkable apart from old lens replacements. Other: None. IMPRESSION: 1. No acute intracranial CT  findings or depressed skull fractures. 2. Chronic changes.  Stable exam intracranially. 3. Increased ethmoid sinus disease. Electronically Signed   By: Francis Quam M.D.   On: 03/12/2024 02:49   US  Venous Img Lower Unilateral Right Result Date: 03/12/2024 CLINICAL DATA:  Right lower extremity swelling EXAM: RIGHT LOWER EXTREMITY VENOUS DOPPLER ULTRASOUND TECHNIQUE: Gray-scale sonography with compression, as well as color and duplex ultrasound, were performed to evaluate the deep venous system(s) from the level of the common femoral vein through the popliteal and proximal calf veins. COMPARISON:  None Available. FINDINGS: VENOUS Normal compressibility of the common femoral, superficial femoral, and popliteal veins, as well as the visualized calf veins. Visualized portions of profunda femoral vein and great saphenous vein unremarkable. No filling defects to suggest DVT on grayscale or color Doppler imaging. Doppler waveforms show normal direction of venous flow, normal respiratory plasticity and response to augmentation. Limited views of the contralateral common femoral vein are unremarkable. OTHER None. Limitations: Right peroneal vein not visualized. IMPRESSION: No evidence of right lower extremity DVT. Electronically Signed   By: Franky Crease M.D.   On: 03/12/2024 00:52   DG Chest Portable 1 View Result Date: 03/11/2024 CLINICAL DATA:  Shortness of breath and cough for 1 week with audible wheezing. EXAM: PORTABLE CHEST 1 VIEW COMPARISON:  Portable chest 02/25/2024 FINDINGS: 11:15 p.m. Left chest dual lead pacing system and wire insertions are unaltered. Cardiomegaly is stable. There is mild central vascular prominence but no overt edema. The aorta is tortuous and calcified. The mediastinum is stable. Small pleural effusions are again shown. There are opacities overlying both pleural effusions which could be due to atelectasis or consolidation, greater on the right. The more cephalad lungs are clear. There  is osteopenia with degenerative change of spine, right shoulder replacement and left shoulder postsurgical changes. Similar findings were present 15 days ago. The overall aeration seems unchanged. IMPRESSION: 1. No significant change from 15 days ago. 2. Stable cardiomegaly and mild central vascular prominence. 3. Small pleural effusions with overlying opacities which could be due to atelectasis or consolidation, greater on the right. Electronically Signed   By: Francis Quam M.D.   On: 03/11/2024 23:29   MR LUMBAR SPINE WO CONTRAST Result Date: 03/09/2024 CLINICAL DATA:  Spinal stenosis EXAM: MRI LUMBAR SPINE WITHOUT CONTRAST TECHNIQUE: Multiplanar, multisequence MR imaging of the lumbar spine was performed. No intravenous contrast was administered. COMPARISON:  None Available. FINDINGS:  Segmentation: Standard. Alignment:  Dextrocurvature of the lumbar spine. Vertebrae: Areas of T1/T2 hyperintensity in lumbar vertebrae most consistent with intraosseous hemangioma or focal fat. Mild chronic compression deformity at T12. Conus medullaris and cauda equina: The conus medullaris terminates at the level of L1-L2. The distal spinal cord signal intensity is normal. Paraspinal and other soft tissues: Renal cysts bilaterally. The visualized aorta is normal. Disc levels: L1-L2: Disc bulge. Moderate bilateral facet arthropathy. Moderate right neuroforaminal stenosis. Mild spinal canal stenosis. L2-L3: Disc bulge. Moderate bilateral facet arthropathy. Moderate bilateral neuroforaminal stenosis. Moderate spinal canal stenosis. L3-L4: Disc bulge. Severe right facet arthropathy. Severe left and mild right neuroforaminal stenosis. Moderate spinal canal stenosis. L4-L5: Disc bulge. Severe bilateral facet arthropathy. Severe left and mild right neuroforaminal stenosis. Moderate spinal canal stenosis. L5-S1: Disc bulge. Severe facet arthropathy. No neuroforaminal stenosis. No spinal canal stenosis. IMPRESSION: 1. Moderate spinal  canal stenosis at L2-L3, L3-L4, and L4-L5 secondary to disc bulging and facet arthropathy 2. Severe left neuroforaminal stenosis at L3-L4 and L4-L5. 3. Moderate neuroforaminal stenosis on the right at L1-L2 and bilaterally at L2-L3. Electronically Signed   By: Clem Savory M.D.   On: 03/09/2024 15:32   DG Abd 1 View Result Date: 02/28/2024 CLINICAL DATA:  Abdominal pain EXAM: ABDOMEN - 1 VIEW COMPARISON:  11/27/2023 FINDINGS: Scattered large and small bowel gas is noted. Bilateral hip surgery is noted. Degenerative changes of the lumbar spine are seen with stable scoliosis concave to the right. No free air seen. No acute bony abnormality is noted. IMPRESSION: No acute abnormality seen. Electronically Signed   By: Oneil Devonshire M.D.   On: 02/28/2024 21:56   US  RENAL Result Date: 02/28/2024 EXAM: RETROPERITONEAL ULTRASOUND OF THE KIDNEYS AND URINARY BLADDER TECHNIQUE: Real-time ultrasonography of the retroperitoneum including the kidneys and urinary bladder was performed. COMPARISON: 06/28/2024 CT CLINICAL HISTORY: Acute kidney injury [facility]) FINDINGS: RIGHT KIDNEY: The right kidney measures 11.8 x 7.6 x 8 cm, 378 cc. Multiple right renal cortical cysts measuring up to 8.1 cm. No hydronephrosis. LEFT KIDNEY: The left kidney measures 12.6 x 6.3 x 6.6 cm, 271 cc. Exophytic left renal cortical cyst 5 cm. No hydronephrosis. BLADDER: Unremarkable appearance of the bladder. SPLEEN: 2 cm splenic cyst. IMPRESSION: 1. No hydronephrosis. 2. Multiple  renal   cysts. 3. 2 cm splenic cyst. Electronically signed by: Katheleen Faes MD 02/28/2024 04:12 PM EDT RP Workstation: [id number]   DG Chest Port 1 View Result Date: 02/25/2024 EXAM: 1 VIEW XRAY OF THE CHEST 02/25/2024 01:48:50 AM COMPARISON: None available. CLINICAL HISTORY: 355200 Chest pain [id number]. PER ER NOTE; Pt arrived via [facility] from home with c/o sudden onset of left sided chest pain accompanied by SOB. Pt reports pain as sharp, dull, aching pain. 1.5 Inch nitroglycerin   paste applied by EMS as well as 324mg  Aspirin  in route to ED. ; ; **Pt has a pacemaker FINDINGS: LUNGS AND PLEURA: Small bilateral pleural effusions with basilar atelectasis. Pulmonary vascular congestion. HEART AND MEDIASTINUM: Mild cardiomegaly with calcific aortic atherosclerosis. BONES AND SOFT TISSUES: Unchanged position of left chest wall pacemaker. IMPRESSION: 1. Mild cardiomegaly with calcific aortic atherosclerosis and pulmonary vascular congestion. 2. Small bilateral pleural effusions with basilar atelectasis. Electronically signed by: Franky Stanford MD 02/25/2024 02:29 AM EDT RP Workstation: [id number]    Microbiology: Results for orders placed or performed during the hospital encounter of 03/11/24  Resp panel by RT-PCR (RSV, Flu A&B, Covid) Anterior Nasal Swab     Status: Abnormal   Collection Time: 03/12/24  1:11  AM   Specimen: Anterior Nasal Swab  Result Value Ref Range Status   SARS Coronavirus 2 by RT PCR POSITIVE (A) NEGATIVE Final    Comment: (NOTE) SARS-CoV-2 target nucleic acids are DETECTED.  The SARS-CoV-2 RNA is generally detectable in upper respiratory specimens during the acute phase of infection. Positive results are indicative of the presence of the identified virus, but do not rule out bacterial infection or co-infection with other pathogens not detected by the test. Clinical correlation with patient history and other diagnostic information is necessary to determine patient infection status. The expected result is Negative.  Fact Sheet for Patients: BloggerCourse.com  Fact Sheet for Healthcare Providers: SeriousBroker.it  This test is not yet approved or cleared by the [place]  FDA and  has been authorized for detection and/or diagnosis of SARS-CoV-2 by FDA under an Emergency Use Authorization (EUA).  This EUA will remain in effect (meaning this test can be used) for the duration of  the COVID-19  declaration under Section 564(b)(1) of the A ct, 21 U.S.C. section 360bbb-3(b)(1), unless the authorization is terminated or revoked sooner.     Influenza A by PCR NEGATIVE NEGATIVE Final   Influenza B by PCR NEGATIVE NEGATIVE Final    Comment: (NOTE) The Xpert Xpress SARS-CoV-2/FLU/RSV plus assay is intended as an aid in the diagnosis of influenza from Nasopharyngeal swab specimens and should not be used as a sole basis for treatment. Nasal washings and aspirates are unacceptable for Xpert Xpress SARS-CoV-2/FLU/RSV testing.  Fact Sheet for Patients: BloggerCourse.com  Fact Sheet for Healthcare Providers: SeriousBroker.it  This test is not yet approved or cleared by the [place]  FDA and has been authorized for detection and/or diagnosis of SARS-CoV-2 by FDA under an Emergency Use Authorization (EUA). This EUA will remain in effect (meaning this test can be used) for the duration of the COVID-19 declaration under Section 564(b)(1) of the Act, 21 U.S.C. section 360bbb-3(b)(1), unless the authorization is terminated or revoked.     Resp Syncytial Virus by PCR NEGATIVE NEGATIVE Final    Comment: (NOTE) Fact Sheet for Patients: BloggerCourse.com  Fact Sheet for Healthcare Providers: SeriousBroker.it  This test is not yet approved or cleared by the [place]  FDA and has been authorized for detection and/or diagnosis of SARS-CoV-2 by FDA under an Emergency Use Authorization (EUA). This EUA will remain in effect (meaning this test can be used) for the duration of the COVID-19 declaration under Section 564(b)(1) of the Act, 21 U.S.C. section 360bbb-3(b)(1), unless the authorization is terminated or revoked.  Performed at [facility], [address] [place], [place] [address]     Labs: CBC: Recent Labs  Lab 03/11/24 2304 03/12/24 0413 03/14/24 0634  03/16/24 0352  WBC 4.1 3.7* 6.8 7.1  NEUTROABS 2.8  --   --   --   HGB 10.0* 10.0* 10.2* 10.7*  HCT 31.2* 32.1* 31.2* 32.6*  MCV 98.1 97.9 96.3 94.5  PLT 152 168 194 233   Basic Metabolic Panel: Recent Labs  Lab 03/12/24 0636 03/13/24 0444 03/14/24 0634 03/15/24 0356 03/16/24 0352 03/17/24 0423  NA 139 140 135 135 132* 136  K 4.7 4.0 3.9 4.2 3.8 4.2  CL 103 102 101 99 99 98  CO2 21* 24 29 23 23 27   GLUCOSE 176* 218* 204* 140* 151* 102*  BUN 69* 83* 89* 96* 98* 101*  CREATININE 2.75* 2.90* 2.96* 3.10* 3.01* 2.95*  CALCIUM 8.7* 9.0 8.5* 8.3* 8.2* 8.4*  MG 2.1 2.2 2.1 2.0  --  2.1   Liver Function Tests: Recent Labs  Lab 03/11/24 2304  AST 44*  ALT 34  ALKPHOS 105  BILITOT 0.7  PROT 6.1*  ALBUMIN  3.1*   CBG: Recent Labs  Lab 03/16/24 0803 03/16/24 1152 03/16/24 1600 03/16/24 2112 03/17/24 0807  GLUCAP 166* 171* 93 134* 97    Discharge time spent: .  Signed: Murvin Mana, MD [facility] 03/17/2024

## 2024-03-17 NOTE — Progress Notes (Signed)
 IVT requested to bedside for PIV insertion. This RN to bedside, pt sitting on EOB with belongings packed. States she is waiting for a wheelchair to leave for d/c.  Reviewed pt chart and noted no d/c orders in. Attempted to redirect, pt adamant that she does not want an IV started that she is leaving. Primary RN notified via [place].

## 2024-03-18 DIAGNOSIS — Z7189 Other specified counseling: Secondary | ICD-10-CM

## 2024-03-18 DIAGNOSIS — I5043 Acute on chronic combined systolic (congestive) and diastolic (congestive) heart failure: Secondary | ICD-10-CM | POA: Diagnosis not present

## 2024-03-18 LAB — BASIC METABOLIC PANEL WITH GFR
Anion gap: 14 (ref 5–15)
BUN: 98 mg/dL — ABNORMAL HIGH (ref 8–23)
CO2: 27 mmol/L (ref 22–32)
Calcium: 8.5 mg/dL — ABNORMAL LOW (ref 8.9–10.3)
Chloride: 97 mmol/L — ABNORMAL LOW (ref 98–111)
Creatinine, Ser: 2.82 mg/dL — ABNORMAL HIGH (ref 0.44–1.00)
GFR, Estimated: 16 mL/min — ABNORMAL LOW (ref >60)
Glucose, Bld: 104 mg/dL — ABNORMAL HIGH (ref 70–99)
Potassium: 4.1 mmol/L (ref 3.5–5.1)
Sodium: 138 mmol/L (ref 135–145)

## 2024-03-18 LAB — MAGNESIUM: Magnesium: 2.2 mg/dL (ref 1.7–2.4)

## 2024-03-18 LAB — GLUCOSE, CAPILLARY
Glucose-Capillary: 98 mg/dL (ref 70–99)
Glucose-Capillary: 138 mg/dL — ABNORMAL HIGH (ref 70–99)
Glucose-Capillary: 134 mg/dL — ABNORMAL HIGH (ref 70–99)
Glucose-Capillary: 172 mg/dL — ABNORMAL HIGH (ref 70–99)

## 2024-03-18 LAB — PHOSPHORUS: Phosphorus: 4.6 mg/dL (ref 2.5–4.6)

## 2024-03-18 MED ORDER — SODIUM CHLORIDE 0.9 % IV SOLN: INTRAVENOUS | Status: AC

## 2024-03-18 NOTE — Plan of Care (Deleted)
 PMT consult noted on this patient. She is familiar to PMT team from previous admissions. Historically during our discussions, she has desired DNR/DNI and treating the treating the treatable.   Our team last spoke with her on 8/7, and at that time, she stated she realized she is needing to come to the hospital more frequently, and desires to continue to come to the hospital as needed. She requested that moving forward, she wants to speak directly with the attending and specialist services that are consulted during the current admission. She stated that everyone keeps discussing the same information, and speaking with PMT does not prevent her from having these conversations with other providers.   Given patient's wishes, PMT will cancel consult. If patient would like to speak with PMT, please reconsult as we would be happy to see her if she desires to do so.  Communicated with attending.

## 2024-03-18 NOTE — TOC Progression Note (Signed)
 Transition of Care [facility]) - Progression Note    Patient Details  Name: Leslie Parker MRN: [id number] Date of Birth: 04-17-1933  Transition of Care [facility]) CM/SW Contact  [name], LCSW Phone Number: 03/18/2024, 4:36 PM  Clinical Narrative:  Patient is on COVID isolation precautions. CSW called patient in the room to discuss SNF. She handed the phone to her daughter, [name], from [place]. Patient was at [facility] 5/9-5/24 (15 days). She came back to the hospital 5/24-26, 6/30-7/3, 8/5-11, and this time on 8/21. She has not had a 60-day wellness period to start her rehab days over. [facility] would not require copays up front. [facility] would require 14 days at a time. Daughter is aware.  Expected Discharge Plan and Services         Expected Discharge Date: 03/17/24                                     Social Drivers of Health (SDOH) Interventions SDOH Screenings   Food Insecurity: No Food Insecurity (03/12/2024)  Housing: Low Risk  (03/12/2024)  Transportation Needs: No Transportation Needs (03/12/2024)  Utilities: Not At Risk (03/12/2024)  Alcohol Screen: Low Risk  (08/20/2023)  Financial Resource Strain: Low Risk  (01/13/2024)   Received from [facility]  Social Connections: Moderately Isolated (03/12/2024)  Tobacco Use: Low Risk  (03/11/2024)    Readmission Risk Interventions    10/22/2023    2:28 PM 09/16/2023    2:52 PM 08/20/2023    3:45 PM  Readmission Risk Prevention Plan  Transportation Screening Complete Complete Complete  Medication Review Oceanographer) Complete Complete Complete  PCP or Specialist appointment within 3-5 days of discharge  Complete Complete  HRI or Home Care Consult Not Complete Patient refused Complete  HRI or Home Care Consult Pt Refusal Comments No PT recs at this time.    SW Recovery Care/Counseling Consult Complete Complete Complete  Palliative Care Screening Not Applicable Not Applicable Not  Applicable  Skilled Nursing Facility Not Applicable Not Applicable Not Applicable

## 2024-03-18 NOTE — Progress Notes (Signed)
 Occupational Therapy Treatment Patient Details Name: Leslie Parker MRN: [id number] DOB: 26-Feb-1933 Today's Date: 03/18/2024   History of present illness Pt is an 89 y.o. female with medical history significant of sCHF with EF 25-30%, SSS (s/p of PPM), A fib on Eliquis , HTN, HLD, DM, COPD, CKD-IV, osteopenia, ileus, who presents with SOB. Pt now testing positive for covid. MD assessment also includes: bilateral lower lobe aspiration pneumonia, dysphagia, acute on chronic combined systolic and diastolic congestive heart failure, myocardial injury, hypothermia, and fall at home with skin tear on the R shin.   OT comments  Upon entering the room, pt supine in bed and sleeping soundly. Pt's daughter present in room and reports family having concerns about pt's ability to get better. She would like to speak to team about plan's and mentions hospice. OT notifying team at end of session. Pt needing total A for bed mobility and unable to give very much effort. Pt does state she is very tired. Pt holding self up at supine for ~ 1 minute with min guard and then needing increased assistance secondary to fatigue and posterior bias. Pt washes face with set up A and daughter combs hair while seated on EOB. No attempts to stand as pt is very lethargic. Pt is placed into chair position at end of session and daughter remains in room. Call bell and all needed items within reach.       If plan is discharge home, recommend the following:  A little help with walking and/or transfers;A little help with bathing/dressing/bathroom;Assist for transportation;Help with stairs or ramp for entrance;Assistance with cooking/housework   Equipment Recommendations  None recommended by OT       Precautions / Restrictions Precautions Precautions: Fall Recall of Precautions/Restrictions: Intact       Mobility Bed Mobility Overal bed mobility: Needs Assistance Bed Mobility: Supine to Sit, Sit to Supine     Supine to sit: Total  assist Sit to supine: Total assist   General bed mobility comments: giving very little effort    Transfers                   General transfer comment: unable to attempt as pt is lethargic     Balance Overall balance assessment: Needs assistance Sitting-balance support: Bilateral upper extremity supported, Feet supported Sitting balance-[name]: Poor                                     ADL either performed or assessed with clinical judgement   ADL Overall ADL's : Needs assistance/impaired     Grooming: Wash/dry hands;Wash/dry face;Brushing hair;Sitting Grooming Details (indicate cue type and reason): set up A to wash face with min A for sitting balance while daughter combed her hair                                    Extremity/Trunk Assessment Upper Extremity Assessment Upper Extremity Assessment: Generalized weakness   Lower Extremity Assessment Lower Extremity Assessment: Generalized weakness        Vision Patient Visual Report: No change from baseline           Communication Communication Communication: Impaired Factors Affecting Communication: Hearing impaired   Cognition Arousal: Lethargic Behavior During Therapy: Flat affect  Cueing   Cueing Techniques: Verbal cues, Tactile cues             Pertinent Vitals/ Pain       Pain Assessment Pain Assessment: Faces Faces Pain Scale: No hurt         Frequency  Min 2X/week        Progress Toward Goals  OT Goals(current goals can now be found in the care plan section)  Progress towards OT goals: OT to reassess next treatment      AM-PAC OT 6 Clicks Daily Activity     Outcome Measure   Help from another person eating meals?: None Help from another person taking care of personal grooming?: None Help from another person toileting, which includes using toliet, bedpan, or urinal?: A Little Help  from another person bathing (including washing, rinsing, drying)?: A Little Help from another person to put on and taking off regular upper body clothing?: None Help from another person to put on and taking off regular lower body clothing?: A Little 6 Click Score: 21    End of Session    OT Visit Diagnosis: Other abnormalities of gait and mobility (R26.89);Muscle weakness (generalized) (M62.81)   Activity Tolerance Patient limited by fatigue;Patient limited by lethargy   Patient Left in bed;with call bell/phone within reach;with bed alarm set;with family/visitor present   Nurse Communication Mobility status;Other (comment) (GOC discussion)        Time: 8645-8579 OT Time Calculation (min): 26 min  Charges: OT General Charges $OT Visit: 1 Visit OT Treatments $Self Care/Home Management : 8-22 mins $Therapeutic Activity: 8-22 mins  Izetta Claude, MS, OTR/L , CBIS ascom [phone]  03/18/24, 2:49 PM

## 2024-03-18 NOTE — Progress Notes (Signed)
 PT Cancellation Note  Patient Details Name: Leslie Parker MRN: [id number] DOB: October 31, 1932   Cancelled Treatment:    Reason Eval/Treat Not Completed: Other (comment) Pt sleeping on arrival, family in room stating she's been sleeping all day and can't stay awake for more than 5 minutes.  She woke enough to open her eyes and say that she was too tired to work with PT today, promptly closed her eyes.  Will maintain on caseload and attempted to see as appropriate and pt is able/willing.  Leslie Parker, DPT 03/18/2024, 4:13 PM

## 2024-03-18 NOTE — Progress Notes (Addendum)
 Daily Progress Note   Patient Name: Leslie Parker       Date: 03/18/2024 DOB: Dec 03, 1932  Age: 89 y.o. MRN#: [id number] Attending Physician: Von Bellis, MD Primary Care Physician: Sherial Bail, MD Admit Date: 03/11/2024  Reason for Consultation/Follow-up: Establishing goals of care  Subjective: Consult received.  Communicated with attending team that patient has not wanted to speak with PMT in most recent hospitalizations.  Attending team advises that currently she is not able to have any conversation, patient's daughter requested palliative care due to decline in her condition, her daughter is at bedside.  Notes and labs reviewed from current and previous admissions since last PMT visit.  Spoke with patient who arouses to voice and is able to tell me her name, that we are at the hospital and that she is here because of COVID.  She states she recognizes me from previous admissions and is amenable to talking.  She then began to drift back to sleep and upon [name] waking her back up, she states she is not feeling very well and she states to talk with Leopoldo and [name] regarding care moving forward.  She drifted back to sleep.  [name] tells me that she has been very sleepy with poor p.o. intake.  Spoke with [name] who is HPOA.  She states she has been concerned with her grandmother status for a long time.  She states she is considering shifting to comfort care as her grandmother is not eating and drinking very well and now has COVID.  She discusses that she will talk to Peterson this evening.  Discussed following up tomorrow.   Length of Stay: 6  Current Medications: Scheduled Meds:   amiodarone   200 mg Oral Daily   amoxicillin -clavulanate  400 mg Oral Q12H   apixaban   2.5 mg Oral BID   ascorbic  acid  250 mg Oral Daily   carvedilol   6.25 mg Oral BID WC   chlorpheniramine-HYDROcodone   5 mL Oral Q12H   cholecalciferol   1,000 Units Oral Daily   cyanocobalamin   2,000 mcg Oral Daily   guaiFENesin   600 mg Oral BID   hydrALAZINE   25 mg Oral Q8H   insulin  aspart  0-9 Units Subcutaneous TID WC   Ipratropium-Albuterol   1 puff Inhalation Q4H   levothyroxine   25 mcg Oral Q0600  multivitamin with minerals  1 tablet Oral Daily   pantoprazole   40 mg Oral BID   predniSONE   10 mg Oral Q breakfast   senna-docusate  2 tablet Oral BID    Continuous Infusions:  sodium chloride       PRN Meds: acetaminophen , albuterol , hydrALAZINE , ondansetron  (ZOFRAN ) IV, mouth rinse, oxyCODONE -acetaminophen   Physical Exam Constitutional:      Comments: Arousable to voice  Pulmonary:     Comments: No oxygen            Vital Signs: BP (!) 145/63 (BP Location: Left Arm)   Pulse (!) 59   Temp 97.6 F (36.4 C)   Resp 16   Ht 5' 9 (1.753 m)   Wt 66.2 kg   SpO2 93%   BMI 21.55 kg/m  SpO2: SpO2: 93 % O2 Device: O2 Device: Room Air O2 Flow Rate: O2 Flow Rate (L/min): 2 L/min  Intake/output summary:  Intake/Output Summary (Last 24 hours) at 03/18/2024 1608 Last data filed at 03/18/2024 0500 Gross per 24 hour  Intake 200 ml  Output 650 ml  Net -450 ml   LBM: Last BM Date : 03/10/24 (pt states this is normal for her) Baseline Weight: Weight: 69.9 kg Most recent weight: Weight: 66.2 kg     Patient Active Problem List   Diagnosis Date Noted   Hypothermia 03/15/2024   Hypothyroidism 03/15/2024   Aspiration pneumonia (HCC) 03/14/2024   COVID-19 virus infection 03/13/2024   Acute on chronic combined systolic and diastolic congestive heart failure (HCC) 03/12/2024   COPD with acute exacerbation (HCC) 03/12/2024   Protein-calorie malnutrition, severe 01/22/2024   Acute on chronic systolic CHF (congestive heart failure) (HCC) 12/14/2023   Scapular fracture 11/26/2023   Acute hypoxic respiratory  failure (HCC) 11/15/2023   Do not resuscitate 11/10/2023   CHF exacerbation (HCC) 11/10/2023   Myocardial injury 11/09/2023   AKI (acute kidney injury) (HCC) 11/05/2023   Insomnia 11/05/2023   Frequent hospital admissions 11/05/2023   Acute exacerbation of CHF (congestive heart failure) (HCC) 10/22/2023   Elevated troponin 10/22/2023   Malnutrition of moderate degree 09/17/2023   CHF (congestive heart failure) (HCC) 09/15/2023   Acute on chronic HFrEF (heart failure with reduced ejection fraction) (HCC) 08/19/2023   Pleural effusion due to CHF (congestive heart failure) (HCC) 08/19/2023   HTN (hypertension), malignant 08/19/2023   Ileus (HCC) 08/12/2023   Large bowel obstruction (HCC) 08/12/2023   CKD (chronic kidney disease) stage 4, GFR 15-29 ml/min (HCC) 08/12/2023   HFrEF (heart failure with reduced ejection fraction) (HCC) 08/12/2023   Acute on chronic combined systolic and diastolic CHF (congestive heart failure) (HCC) 08/10/2023   Hyponatremia 05/05/2023   Overweight (BMI 25.0-29.9) 05/01/2023   Left hip pain 05/01/2023   Hypokalemia 04/30/2023   Anemia 03/21/2023   Abnormal LFTs 03/20/2023   Fall at home, initial encounter 02/13/2023   Type II diabetes mellitus with renal manifestations (HCC) 02/13/2023   Chronic diastolic CHF (congestive heart failure) (HCC) 02/13/2023   CKD stage 3 due to type 2 diabetes mellitus (HCC) 11/24/2022   Paroxysmal atrial fibrillation (HCC) 11/24/2022   Sick sinus syndrome (HCC) 11/15/2022   Acute on chronic diastolic CHF (congestive heart failure) (HCC) 08/27/2022   COPD (chronic obstructive pulmonary disease) (HCC) 08/23/2022   Chronic a-fib (HCC) 08/22/2022   Diabetes mellitus without complication (HCC)    Stage 3b chronic kidney disease (HCC) - Baseline scr 1.8-2.0    Postural dizziness with presyncope    Hypomagnesemia    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   In Pre-Arrest Conditions (Patient Is Breathing and Has A Pulse) Do not intubate. Provide all appropriate non-invasive medical interventions. Avoid ICU transfer unless indicated or required.   Consent: Discussion documented in EHR or advanced directives reviewed      03/12/24 0044           Code Status History     Date Active Date Inactive Code Status Order ID Comments User Context   02/25/2024 0902 03/02/2024 1927 Full Code [id number]  Collie Daved BROCKS, DO ED   01/20/2024 1246 01/23/2024 1837 Limited: Do not attempt resuscitation (DNR) -DNR-LIMITED -Do Not Intubate/DNI  [id number]  Kandis Devaughn Sayres, MD ED   12/14/2023 1049 12/16/2023 1750 Limited: Do not attempt resuscitation (DNR) -DNR-LIMITED -Do Not Intubate/DNI  [id number]  Sherlon Brayton RAMAN, MD ED   11/26/2023 2013 11/29/2023 1830 Limited: Do not attempt resuscitation (DNR) -DNR-LIMITED -Do Not Intubate/DNI  [id number]  Arnett Saunders, MD ED   11/15/2023 1442 11/17/2023 1927 Limited: Do not attempt resuscitation (DNR) -DNR-LIMITED -Do Not Intubate/DNI  [id number]  Eldonna Elspeth PARAS, MD ED   11/10/2023 1015 11/13/2023 0151 Limited: Do not attempt resuscitation (DNR) -DNR-LIMITED -Do Not Intubate/DNI  [id number]  Marsa Edelman, DO ED   11/09/2023 1823 11/10/2023 1015 Full Code [id number]  Hilma Rankins, MD ED   11/05/2023 1235 11/07/2023 2347 Full Code [id number]  Cox, Amy N, DO ED   10/22/2023 1300 10/24/2023 1831 Full Code [id number]  Cox, Amy N, DO ED   10/11/2023 1144 10/14/2023 1909 Full Code [id number]  Laurita Cort DASEN, MD ED    09/15/2023 0830 09/19/2023 2010 Full Code [id number]  Laurita Cort DASEN, MD ED   08/19/2023 2258 08/23/2023 2148 Full Code [id number]  Hilma Rankins, MD ED   08/12/2023 0927 08/16/2023 2054 Full Code [id number]  Eldonna Elspeth PARAS, MD ED   08/07/2023 1626 08/11/2023 1912 Full Code [id number]  Eldonna Elspeth PARAS, MD ED   06/16/2023 1227 06/19/2023 1856 Full Code [id number]  Laurita Cort DASEN, MD ED   06/04/2023 1527 06/07/2023 1840 Full Code [id number]  Eldonna Elspeth PARAS, MD ED   04/30/2023 1659 05/09/2023 2220 Full Code [id number]  Lanetta Lingo, MD ED   03/21/2023 0012 04/01/2023 1618 Full Code [id number]  Tobie Mario GAILS, MD ED   02/13/2023 0822 02/15/2023 1825 Full Code [id number]  Hilma Rankins, MD ED   11/24/2022 2339 11/29/2022 1924 Full Code [id number]  Lanetta Lingo, MD ED   11/15/2022 0928 11/16/2022 1952 Full Code [id number]  Ammon Marsa, MD Inpatient   08/22/2022 2153 08/27/2022 1916 Full Code [id number]  Tobie Mario GAILS, MD ED   06/05/2022 2039 06/07/2022 1712 Full Code [id number]  Cleatus Delayne GAILS, MD ED   09/03/2020 0433 09/05/2020 1806 Full Code [id number]  Mansy, Madison LABOR, MD ED   12/02/2017 1152 12/04/2017 1731 Full Code [id number]  Leora Lynwood SAUNDERS, MD Inpatient   01/25/2017 1402 01/28/2017 2028 Full Code  [id number]  Yisroel Sleight, MD ED   01/17/2017 1444 01/18/2017 1754 Full Code [id number]  Marchia Drivers, MD Inpatient   02/04/2015 1534 02/10/2015 2036 Full Code [id number]  Marchia Drivers, MD Inpatient   02/03/2015 1751 02/04/2015 1534 Full Code [id number]  Marchia Drivers, MD Inpatient   02/03/2015 1749 02/03/2015 1750 Full Code [id number]  Marchia Drivers, MD Inpatient   02/03/2015 1338 02/03/2015 1749 Full Code [id number]  Yisroel Sleight, MD ED      Advance Directive Documentation    Flowsheet Row Most Recent Value  Type of Advance Directive Healthcare Power of Attorney  Pre-existing out of facility DNR order (yellow form or pink MOST form) --  MOST Form in Place? --   [name] Lewis, NP  Please contact Palliative Medicine  Team phone at [phone] for questions and concerns.

## 2024-03-18 NOTE — Plan of Care (Signed)
  Problem: Education: Goal: Ability to describe self-care measures that may prevent or decrease complications (Diabetes Survival Skills Education) will improve Outcome: Progressing   Problem: Coping: Goal: Ability to adjust to condition or change in health will improve Outcome: Progressing   Problem: Fluid Volume: Goal: Ability to maintain a balanced intake and output will improve Outcome: Progressing   Problem: Health Behavior/Discharge Planning: Goal: Ability to manage health-related needs will improve Outcome: Progressing   Problem: Metabolic: Goal: Ability to maintain appropriate glucose levels will improve Outcome: Progressing   Problem: Nutritional: Goal: Maintenance of adequate nutrition will improve Outcome: Progressing Goal: Progress toward achieving an optimal weight will improve Outcome: Progressing   Problem: Skin Integrity: Goal: Risk for impaired skin integrity will decrease Outcome: Progressing   Problem: Tissue Perfusion: Goal: Adequacy of tissue perfusion will improve Outcome: Progressing   Problem: Clinical Measurements: Goal: Ability to maintain clinical measurements within normal limits will improve Outcome: Progressing Goal: Will remain free from infection Outcome: Progressing Goal: Diagnostic test results will improve Outcome: Progressing Goal: Respiratory complications will improve Outcome: Progressing

## 2024-03-18 NOTE — Progress Notes (Signed)
 Progress Note   Patient: Leslie Parker DOB: 04/26/1933 DOA: 03/11/2024     6 DOS: the patient was seen and examined on 03/18/2024   Brief hospital course: Leslie Parker is a 89 y.o. female with medical history significant of  sCHF with EF 25-30%, SSS (s/p of PPM), A fib on Eliquis , HTN, HLD, DM, COPD, CKD-IV, osteopenia, ileus, who presents with SOB.  Chest x-ray showed cardiomegaly with vascular congestion, small amount of bilateral pleural effusion.  BNP more than 4000.  She was diagnosed with acute on chronic systolic congestive heart failure, was started on IV Lasix .  She is also placed on steroids for bronchospasm.  However, her COVID test came back positive. Patient has received multiple days of IV Lasix , also started on steroids, antibiotics.  Condition finally improving.  Medically stable for discharge.   Principal Problem:   Acute on chronic combined systolic and diastolic congestive heart failure (HCC) Active Problems:   COPD with acute exacerbation (HCC)   Myocardial injury   Paroxysmal atrial fibrillation (HCC)   Type II diabetes mellitus with renal manifestations (HCC)   CKD (chronic kidney disease) stage 4, GFR 15-29 ml/min (HCC)   HTN (hypertension), malignant   Essential hypertension   Fall at home, initial encounter   COVID-19 virus infection   Aspiration pneumonia (HCC)   Hypothermia   Hypothyroidism   Assessment and Plan: Bilateral lower lobe aspiration pneumonia. Dysphagia Patient had an onset of worsening short of breath and chest pain yesterday.  Troponin not significant elevated.  Chest x-ray showed bilateral pleural effusion, with increased bilateral airspace disease. She was placed on Unasyn , but a procalcitonin level not significant elevated.  This may reflect a chemical aspiration.  Change antibiotic to Augmentin  for total abx course of 5 days.  She states that she has chronic dysphagia from esophageal problems, seen by speech therapist.  Not  currently a good candidate for EGD.  Will follow-up with GI as outpatient. Continue combivent  inhalers.  Increased dose to every 4 hours.  Patient still has significant upper airway secretion.     Acute on chronic combined systolic and diastolic congestive heart failure (HCC) Myocardial injury secondary to congestive heart failure. : Patient has SOB, 1+ leg edema, significantly elevated BNP> 4500, clinically consistent with CHF exacerbation.  2D echo on 08/09/2023 showed EF 25-30%.  Treated with IV Lasix , he is diuresing well, renal function started getting worse. Patient still has significant rhonchi in the bases, I have changed Lasix  to 40 mg daily, give a dose of albumin  8/23.  Renal function stabilized today after holding additional dose of the Lasix  yesterday.   Patient was significant volume overload, s/p IV Lasix .   Due to concern about renal function, Lasix  once a day but with higher dose. 8/27 looks dry today due to decreased oral intake.  Patient was advised to continue adequate water for hydration otherwise we may have to start IV fluid.     Hypothermia.   Hypothyroidism. Patient developed hypothermia last night, was put on heating blanket. Started on Synthroid  for hypothyroidism.  Also on steroids. Continue to monitor.   COPD with acute exacerbation (HCC) COVID-19 infection.:  Patient has significant wheezing, this is caused by COVID infection.  Continue steroids.  Continue bronchodilator.  I have scheduled Combivent .  Reduced the rate to prednisone  10 mg daily due to significant hyperglycemia.  Will continue finishing 10 days of steroids course.   HTN (hypertension) -IV hydralazine  as needed -Coreg , oral hydralazine    Chronic  a-fib (HCC):  -Eliquis  -Coreg , amodarone No tachycardia.   CKD (chronic kidney disease) stage 4, GFR 15-29 ml/min (HCC):  Patient renal function has been gradually getting worse, but is starting better today after holding Lasix . S/p IV Lasix .   Monitor renal function.    Type II diabetes mellitus with renal manifestations (HCC) Uncontrolled type 2 diabetes with hyperglycemia.: Recent A1c 7.0.  Developed significant hyperglycemia secondary to steroid use.  Prednisone  dose decreased.  No longer has significant hyperglycemia.  Continue sliding scale insulin  at lower dose.   Fall at home, initial encounter Skin tear in the right shin. -consult TOC for [facility] need or rehab/SNF placement Family wish to bring patient home on Monday. 8/27 patient's condition is declining, family requested for SNF placement.  Today patient was sleepy and lethargic, able to wake up and answer questions, AO x 3. Decreased appetite and decreased oral intake, looks dry.  Advised to encourage for diet and oral water intake.   Palliative care consulted for goals of care discussion with family.     Subjective:  Patient was seen and examined at bedside.  No significant events overnight. Patient was very lethargic and sleepy, she did wake up and AO x 3, dozing off during my interview.  Unable to offer any complaint Noticed mild productive cough, no significant respiratory issue. Discussed with patient and daughter at bedside, she requested palliative care consult for goals of care discussion.  Advised to encourage oral intake.   Physical Exam: Vitals:   03/18/24 0500 03/18/24 0503 03/18/24 0752 03/18/24 1131  BP:  132/73 138/60 (!) 145/63  Pulse:  60 60 (!) 59  Resp:  19 16 16   Temp:  97.6 F (36.4 C) 98.9 F (37.2 C) 97.6 F (36.4 C)  TempSrc:      SpO2:  98% 91% 93%  Weight: 66.2 kg     Height:       General exam: NAD, lethargic and sleepy Respiratory system: Equal air entry bilaterally, no significant wheezes or crackles Cardiovascular system: S1 & S2 heard, RRR. N murmurs. Gastrointestinal system: BS present, NT/ND Central nervous system: No focal deficits, unable to follow commands, lethargic. AAOx 3 Extremities: No edema Skin: No rashes,  lesions or ulcers Psychiatry: Sleepy and lethargic    Data Reviewed:  Lab results reviewed  Family Communication: Granddaughter updated over the phone  Disposition: Status is: Inpatient Remains inpatient appropriate because: Severity of disease, IV treatment     Time spent: 40 minutes  Author: Elvan Sor, MD 03/18/2024 4:26 PM  For on call review [email or web address].

## 2024-03-19 DIAGNOSIS — I5043 Acute on chronic combined systolic (congestive) and diastolic (congestive) heart failure: Secondary | ICD-10-CM | POA: Diagnosis not present

## 2024-03-19 DIAGNOSIS — Z7189 Other specified counseling: Secondary | ICD-10-CM | POA: Diagnosis not present

## 2024-03-19 LAB — GLUCOSE, CAPILLARY
Glucose-Capillary: 138 mg/dL — ABNORMAL HIGH (ref 70–99)
Glucose-Capillary: 135 mg/dL — ABNORMAL HIGH (ref 70–99)
Glucose-Capillary: 139 mg/dL — ABNORMAL HIGH (ref 70–99)
Glucose-Capillary: 166 mg/dL — ABNORMAL HIGH (ref 70–99)

## 2024-03-19 LAB — CBC
HCT: 33.2 % — ABNORMAL LOW (ref 36.0–46.0)
Hemoglobin: 10.9 g/dL — ABNORMAL LOW (ref 12.0–15.0)
MCH: 30.4 pg (ref 26.0–34.0)
MCHC: 32.8 g/dL (ref 30.0–36.0)
MCV: 92.5 fL (ref 80.0–100.0)
Platelets: 217 K/uL (ref 150–400)
RBC: 3.59 MIL/uL — ABNORMAL LOW (ref 3.87–5.11)
RDW: 15.9 % — ABNORMAL HIGH (ref 11.5–15.5)
WBC: 6.1 K/uL (ref 4.0–10.5)
nRBC: 0 % (ref 0.0–0.2)

## 2024-03-19 LAB — BASIC METABOLIC PANEL WITH GFR
Anion gap: 12 (ref 5–15)
BUN: 95 mg/dL — ABNORMAL HIGH (ref 8–23)
CO2: 25 mmol/L (ref 22–32)
Calcium: 8.2 mg/dL — ABNORMAL LOW (ref 8.9–10.3)
Chloride: 99 mmol/L (ref 98–111)
Creatinine, Ser: 2.84 mg/dL — ABNORMAL HIGH (ref 0.44–1.00)
GFR, Estimated: 15 mL/min — ABNORMAL LOW (ref >60)
Glucose, Bld: 153 mg/dL — ABNORMAL HIGH (ref 70–99)
Potassium: 3.7 mmol/L (ref 3.5–5.1)
Sodium: 136 mmol/L (ref 135–145)

## 2024-03-19 LAB — MAGNESIUM: Magnesium: 2.1 mg/dL (ref 1.7–2.4)

## 2024-03-19 LAB — PHOSPHORUS: Phosphorus: 3.2 mg/dL (ref 2.5–4.6)

## 2024-03-19 NOTE — Plan of Care (Signed)
   Problem: Education: Goal: Knowledge of General Education information will improve Description Including pain rating scale, medication(s)/side effects and non-pharmacologic comfort measures Outcome: Progressing

## 2024-03-19 NOTE — Progress Notes (Signed)
 Physical Therapy Treatment Patient Details Name: Leslie Parker MRN: [id number] DOB: Jul 10, 1933 Today's Date: 03/19/2024   History of Present Illness Pt is an 89 y.o. female with medical history significant of sCHF with EF 25-30%, SSS (s/p of PPM), A fib on Eliquis , HTN, HLD, DM, COPD, CKD-IV, osteopenia, ileus, who presents with SOB. Pt now testing positive for covid. MD assessment also includes: bilateral lower lobe aspiration pneumonia, dysphagia, acute on chronic combined systolic and diastolic congestive heart failure, myocardial injury, hypothermia, and fall at home with skin tear on the R shin.    PT Comments  Pt resistant to do a lot citing being tired, however with encouragement did agree to do some supine LE exercises.  Her eyes were closed a majority of the session (seemingly drifting off to sleep at times) but she was able to participate relatively well with LE exercises with gentle cuing and reinforcement to stay on task.  Pt unable to do SLRs against gravity but did show good effort with most exercises taking some resistance with most of them.  Pt voices that she is anxious about the idea of where they are going to take her (presumably talking about the plan for short term rehab).  Pt will benefit from ongoing PT, continue with POC.     If plan is discharge home, recommend the following: Assistance with cooking/housework;Assist for transportation;A little help with bathing/dressing/bathroom;Supervision due to cognitive status;A lot of help with walking and/or transfers   Can travel by private vehicle     No  Equipment Recommendations  None recommended by PT    Recommendations for Other Services       Precautions / Restrictions Precautions Precautions: Fall Restrictions Weight Bearing Restrictions Per Provider Order: No     Mobility  Bed Mobility               General bed mobility comments: pt deferring mobility this date    Transfers                         Ambulation/Gait                   Stairs             Wheelchair Mobility     Tilt Bed    Modified Rankin (Stroke Patients Only)       Balance                                            Communication Communication Communication: Impaired Factors Affecting Communication: Reduced clarity of speech  Cognition Arousal: Lethargic Behavior During Therapy: WFL for tasks assessed/performed (pt did seemingly fall asleep at times t/o session)   PT - Cognitive impairments: No apparent impairments                         Following commands: Intact      Cueing Cueing Techniques: Verbal cues, Tactile cues  Exercises General Exercises - Lower Extremity Ankle Circles/Pumps: AROM, 10 reps Quad Sets: Strengthening, 10 reps Short Arc Quad: AROM, Strengthening, 10 reps Heel Slides: AROM, Strengthening, 10 reps (with resisted leg ext) Hip ABduction/ADduction: Strengthening, 10 reps Straight Leg Raises:  (unable)    General Comments General comments (skin integrity, edema, etc.): Pt sleeping on arrival, wakes and interactive though not willing to  try sitting up/moving.  Did eventually agree with LE exercises in supine.      Pertinent Vitals/Pain Pain Assessment Pain Assessment:  (Just nausea pain)    Home Living                          Prior Function            PT Goals (current goals can now be found in the care plan section) Acute Rehab PT Goals PT Goal Formulation: With patient Time For Goal Achievement: 04/01/24 Potential to Achieve Goals: Fair Progress towards PT goals:  (slow progress)    Frequency    Min 2X/week      PT Plan      Co-evaluation              AM-PAC PT 6 Clicks Mobility   Outcome Measure  Help needed turning from your back to your side while in a flat bed without using bedrails?: A Lot Help needed moving from lying on your back to sitting on the side of a flat bed without  using bedrails?: A Lot Help needed moving to and from a bed to a chair (including a wheelchair)?: A Lot Help needed standing up from a chair using your arms (e.g., wheelchair or bedside chair)?: A Lot Help needed to walk in hospital room?: A Lot Help needed climbing 3-5 steps with a railing? : Total 6 Click Score: 11    End of Session Equipment Utilized During Treatment: Gait belt Activity Tolerance: Patient limited by lethargy Patient left: in bed;with call bell/phone within reach;with bed alarm set Nurse Communication: Mobility status;Other (comment) PT Visit Diagnosis: Muscle weakness (generalized) (M62.81);Difficulty in walking, not elsewhere classified (R26.2);Unsteadiness on feet (R26.81);Repeated falls (R29.6) Pain - Right/Left: Right Pain - part of body: Leg     Time: 8684-8667 PT Time Calculation (min) (ACUTE ONLY): 17 min  Charges:    $Therapeutic Exercise: 8-22 mins PT General Charges $$ ACUTE PT VISIT: 1 Visit                     Carmin JONELLE Deed, DPT 03/19/2024, 2:18 PM

## 2024-03-19 NOTE — Progress Notes (Signed)
 Progress Note   Patient: Leslie Parker FMW:[id number] DOB: Dec 18, 1932 DOA: 03/11/2024     7 DOS: the patient was seen and examined on 03/19/2024   Brief hospital course: MARVELINE PROFETA is a 89 y.o. female with medical history significant of  sCHF with EF 25-30%, SSS (s/p of PPM), A fib on Eliquis , HTN, HLD, DM, COPD, CKD-IV, osteopenia, ileus, who presents with SOB.  Chest x-ray showed cardiomegaly with vascular congestion, small amount of bilateral pleural effusion.  BNP more than 4000.  She was diagnosed with acute on chronic systolic congestive heart failure, was started on IV Lasix .  She is also placed on steroids for bronchospasm.  However, her COVID test came back positive. Patient has received multiple days of IV Lasix , also started on steroids, antibiotics.  Condition finally improving.  Medically stable for discharge.   Principal Problem:   Acute on chronic combined systolic and diastolic congestive heart failure (HCC) Active Problems:   COPD with acute exacerbation (HCC)   Myocardial injury   Paroxysmal atrial fibrillation (HCC)   Type II diabetes mellitus with renal manifestations (HCC)   CKD (chronic kidney disease) stage 4, GFR 15-29 ml/min (HCC)   HTN (hypertension), malignant   Essential hypertension   Fall at home, initial encounter   COVID-19 virus infection   Aspiration pneumonia (HCC)   Hypothermia   Hypothyroidism   Assessment and Plan:  # Bilateral lower lobe aspiration pneumonia. # Dysphagia Patient had an onset of worsening short of breath and chest pain yesterday.  Troponin not significant elevated.  Chest x-ray showed bilateral pleural effusion, with increased bilateral airspace disease. She was placed on Unasyn , but a procalcitonin level not significant elevated.  This may reflect a chemical aspiration.  Change antibiotic to Augmentin  for total abx course of 5 days.  She states that she has chronic dysphagia from esophageal problems, seen by speech therapist.   Not currently a good candidate for EGD.  Will follow-up with GI as outpatient. Continue combivent  inhalers.  Increased dose to every 4 hours.  Patient still has significant upper airway secretion.     # Acute on chronic combined systolic and diastolic congestive heart failure (HCC) # Myocardial injury secondary to congestive heart failure. : Patient has SOB, 1+ leg edema, significantly elevated BNP> 4500, clinically consistent with CHF exacerbation.  2D echo on 08/09/2023 showed EF 25-30%.  Treated with IV Lasix , he is diuresing well, renal function started getting worse. Patient still has significant rhonchi in the bases, I have changed Lasix  to 40 mg daily, give a dose of albumin  8/23.  Renal function stabilized today after holding additional dose of the Lasix  yesterday.   Patient was significant volume overload, s/p IV Lasix .   Due to concern about renal function, Lasix  once a day but with higher dose. 8/27 looks dry today due to decreased oral intake.  Patient was advised to continue adequate water for hydration otherwise we may have to start IV fluid.     Hypothermia.  Resolved  Hypothyroidism. Patient developed hypothermia last night, was put on heating blanket. Started on Synthroid  for hypothyroidism.  Also on steroids. Continue to monitor.   COPD with acute exacerbation (HCC) COVID-19 infection.:  Patient has significant wheezing, this is caused by COVID infection.  Continue steroids.  Continue bronchodilator.  I have scheduled Combivent .  Reduced the rate to prednisone  10 mg daily due to significant hyperglycemia.  Will continue finishing 10 days of steroids course.   HTN (hypertension) -IV hydralazine  as needed -  Coreg , oral hydralazine    Chronic a-fib (HCC):  -Eliquis  -Coreg , amodarone No tachycardia.   CKD (chronic kidney disease) stage 4, GFR 15-29 ml/min (HCC):  Patient renal function has been gradually getting worse, but is starting better today after holding Lasix .  S/p IV Lasix .  Monitor renal function.    Type II diabetes mellitus with renal manifestations (HCC) Uncontrolled type 2 diabetes with hyperglycemia.: Recent A1c 7.0.  Developed significant hyperglycemia secondary to steroid use.  Prednisone  dose decreased.  No longer has significant hyperglycemia.  Continue sliding scale insulin  at lower dose.   Fall at home, initial encounter Skin tear in the right shin. -consult TOC for [facility] need or rehab/SNF placement Family wish to bring patient home on Monday. 8/27 patient's condition is declining, family requested for SNF placement.  Today patient was sleepy and lethargic, able to wake up and answer questions, AO x 3. Decreased appetite and decreased oral intake, looks dry.  Advised to encourage for diet and oral water intake.   Palliative care consulted for goals of care discussion with family.     Subjective:  Patient was seen and examined at bedside.  No significant events overnight. Patient is more awake and alert today, still has generalized weakness, no any other specific complaints. Still has low appetite, encouraged to increase oral intake.   Physical Exam: Vitals:   03/19/24 0400 03/19/24 0500 03/19/24 0827 03/19/24 1118  BP: (!) 142/72  (!) 144/79 125/78  Pulse: 60  68 (!) 59  Resp: 20   18  Temp: (!) 97.5 F (36.4 C)  97.7 F (36.5 C) 97.7 F (36.5 C)  TempSrc:   Axillary Axillary  SpO2: 90%  100% 97%  Weight:  68.9 kg    Height:       General exam: NAD, lethargic and sleepy Respiratory system: Equal air entry bilaterally, no significant wheezes or crackles Cardiovascular system: S1 & S2 heard, RRR. N murmurs. Gastrointestinal system: BS present, NT/ND Central nervous system: No focal deficits, unable to follow commands, lethargic. AAOx 3 Extremities: No edema Skin: No rashes, lesions or ulcers Psychiatry: Sleepy and lethargic    Data Reviewed:  Lab results reviewed  Family Communication:  8/28 d/w family at  bedside  Disposition: Status is: Inpatient Remains inpatient appropriate because: Severity of disease, IV treatment 8/28 awaiting for SNF placement     Time spent: 40 minutes  Author: Elvan Sor, MD 03/19/2024 4:38 PM  For on call review [email or web address].

## 2024-03-19 NOTE — Progress Notes (Addendum)
 Daily Progress Note   Patient Name: Leslie Parker       Date: 03/19/2024 DOB: [birth year]  Age: 89 y.o. MRN#: [id number] Attending Physician: Von Bellis, MD Primary Care Physician: Sherial Bail, MD Admit Date: 03/11/2024  Reason for Consultation/Follow-up: Establishing goals of care  Subjective: Notes and labs reviewed.  Into see patient.  She is sitting in bed and greets me upon entry.  She states she recognizes me and knows who I am from previous visits.  She states she is feeling much better today than she did yesterday.  She shares frustration and states she feels that everyone is always trying to tell her what she should do both at home and when she goes to the hospital or doctors office.  She discusses that she has had a hard life and has always been able to take care of herself.  She discusses her little apartment.  She discusses raising her children.  She discusses that she values that independence and ability to make decisions for herself. She states she understands her status and states she is 89 years old.   She states she does not understand why everyone keeps asking the same questions each admission.  She confirms DNR and DNI status.  She shares she is unable to understand why nobody is able to grasp that she does not have a problem with coming to the hospital.  She states as long as she is okay with that, everyone else should be as well.  She states she wants to continue care to prolong her time on this world.  She states she is continuing these conversations with her family as well.  Length of Stay: 7  Current Medications: Scheduled Meds:   amiodarone   200 mg Oral Daily   amoxicillin -clavulanate  400 mg Oral Q12H   apixaban   2.5 mg Oral BID   ascorbic acid   250 mg Oral Daily    carvedilol   6.25 mg Oral BID WC   chlorpheniramine-HYDROcodone   5 mL Oral Q12H   cholecalciferol   1,000 Units Oral Daily   cyanocobalamin   2,000 mcg Oral Daily   guaiFENesin   600 mg Oral BID   hydrALAZINE   25 mg Oral Q8H   insulin  aspart  0-9 Units Subcutaneous TID WC   Ipratropium-Albuterol   1 puff Inhalation Q4H   levothyroxine   25 mcg Oral Q0600   multivitamin with minerals  1 tablet Oral Daily   pantoprazole   40 mg Oral BID   predniSONE   10 mg Oral Q breakfast   senna-docusate  2 tablet Oral BID    Continuous Infusions:   PRN Meds: acetaminophen , albuterol , hydrALAZINE , ondansetron  (ZOFRAN ) IV, mouth rinse, oxyCODONE -acetaminophen   Physical Exam Pulmonary:     Effort: Pulmonary effort is normal.     Comments: No oxygen Neurological:     Mental Status: She is alert.             Vital Signs: BP 125/78 (BP Location: Left Arm)   Pulse (!) 59   Temp 97.7 F (36.5 C) (Axillary)   Resp 18   Ht 5' 9 (1.753 m)   Wt 68.9 kg   SpO2 97%   BMI 22.43 kg/m  SpO2: SpO2: 97 % O2 Device: O2 Device: Room Air O2 Flow Rate: O2 Flow Rate (L/min): 2 L/min  Intake/output summary:  Intake/Output Summary (Last 24 hours) at 03/19/2024 1627 Last data filed at 03/19/2024 1200 Gross per 24 hour  Intake 360 ml  Output 600 ml  Net -240 ml   LBM: Last BM Date : 03/10/24 Baseline Weight: Weight: 69.9 kg Most recent weight: Weight: 68.9 kg   Patient Active Problem List   Diagnosis Date Noted   Hypothermia 03/15/2024   Hypothyroidism 03/15/2024   Aspiration pneumonia (HCC) 03/14/2024   COVID-19 virus infection 03/13/2024   Acute on chronic combined systolic and diastolic congestive heart failure (HCC) 03/12/2024   COPD with acute exacerbation (HCC) 03/12/2024   Protein-calorie malnutrition, severe 01/22/2024   Acute on chronic systolic CHF (congestive heart failure) (HCC) 12/14/2023   Scapular fracture 11/26/2023   Acute hypoxic respiratory failure (HCC) 11/15/2023   Do not  resuscitate 11/10/2023   CHF exacerbation (HCC) 11/10/2023   Myocardial injury 11/09/2023   AKI (acute kidney injury) (HCC) 11/05/2023   Insomnia 11/05/2023   Frequent hospital admissions 11/05/2023   Acute exacerbation of CHF (congestive heart failure) (HCC) 10/22/2023   Elevated troponin 10/22/2023   Malnutrition of moderate degree 09/17/2023   CHF (congestive heart failure) (HCC) 09/15/2023   Acute on chronic HFrEF (heart failure with reduced ejection fraction) (HCC) 08/19/2023   Pleural effusion due to CHF (congestive heart failure) (HCC) 08/19/2023   HTN (hypertension), malignant 08/19/2023   Ileus (HCC) 08/12/2023   Large bowel obstruction (HCC) 08/12/2023   CKD (chronic kidney disease) stage 4, GFR 15-29 ml/min (HCC) 08/12/2023   HFrEF (heart failure with reduced ejection fraction) (HCC) 08/12/2023   Acute on chronic combined systolic and diastolic CHF (congestive heart failure) (HCC) 08/10/2023   Hyponatremia 05/05/2023   Overweight (BMI 25.0-29.9) 05/01/2023   Left hip pain 05/01/2023   Hypokalemia 04/30/2023   Anemia 03/21/2023   Abnormal LFTs 03/20/2023   Fall at home, initial encounter 02/13/2023   Type II diabetes mellitus with renal manifestations (HCC) 02/13/2023   Chronic diastolic CHF (congestive heart failure) (HCC) 02/13/2023   CKD stage 3 due to type 2 diabetes mellitus (HCC) 11/24/2022   Paroxysmal atrial fibrillation (HCC) 11/24/2022   Sick sinus syndrome (HCC) 11/15/2022   Acute on chronic diastolic CHF (congestive heart failure) (HCC) 08/27/2022   COPD (chronic obstructive pulmonary disease) (HCC) 08/23/2022   Chronic a-fib (HCC) 08/22/2022   Diabetes mellitus without complication (HCC)    Stage 3b chronic kidney disease (HCC) - Baseline scr 1.8-2.0    Postural dizziness with presyncope    Hypomagnesemia    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 Continue care  Code Status:    Code Status Orders  (From admission, onward)           Start     Ordered   03/12/24 0045  Do not attempt resuscitation (DNR)- Limited -Do Not Intubate (DNI)  Continuous       Question Answer Comment  If pulseless and not breathing No CPR or chest compressions.   In Pre-Arrest Conditions (Patient Is Breathing and Has A Pulse) Do not intubate. Provide all appropriate non-invasive medical interventions. Avoid ICU transfer unless indicated or required.   Consent: Discussion documented in EHR or advanced directives reviewed      03/12/24 0044           Code Status History     Date Active Date Inactive Code Status Order ID Comments User Context   02/25/2024 0902 03/02/2024 1927 Full Code [id number]  Collie Daved BROCKS, DO ED   01/20/2024 1246 01/23/2024 1837 Limited: Do not attempt resuscitation (DNR) -DNR-LIMITED -Do Not Intubate/DNI  [id number]  Kandis Devaughn Sayres, MD ED   12/14/2023 1049 12/16/2023 1750 Limited: Do not attempt resuscitation (DNR) -DNR-LIMITED -Do Not Intubate/DNI  [id number]  Sherlon Brayton RAMAN, MD ED   11/26/2023 2013 11/29/2023 1830 Limited: Do not attempt resuscitation (DNR) -DNR-LIMITED -Do Not Intubate/DNI  [id number]  Arnett Saunders, MD ED   11/15/2023 1442 11/17/2023 1927 Limited: Do not attempt resuscitation (DNR) -DNR-LIMITED -Do Not Intubate/DNI  [id number]  Eldonna Elspeth PARAS, MD ED   11/10/2023 1015 11/13/2023 0151 Limited: Do not attempt resuscitation (DNR) -DNR-LIMITED -Do Not Intubate/DNI  [id number]  Marsa Edelman, DO ED   11/09/2023 1823 11/10/2023 1015 Full Code [id number]  Hilma Rankins, MD ED   11/05/2023 1235 11/07/2023 2347 Full Code [id number]  Cox, Amy N, DO ED   10/22/2023 1300 10/24/2023 1831 Full Code [id number]  Cox, Amy N, DO ED   10/11/2023 1144 10/14/2023 1909 Full Code [id number]  Laurita Cort DASEN, MD ED   09/15/2023 0830 09/19/2023 2010 Full Code  [id number]  Laurita Cort DASEN, MD ED   08/19/2023 2258 08/23/2023 2148 Full Code [id number]  Hilma Rankins, MD ED   08/12/2023 0927 08/16/2023 2054 Full Code [id number]  Eldonna Elspeth PARAS, MD ED   08/07/2023 1626 08/11/2023 1912 Full Code [id number]  Eldonna Elspeth PARAS, MD ED   06/16/2023 1227 06/19/2023 1856 Full Code [id number]  Laurita Cort DASEN, MD ED   06/04/2023 1527 06/07/2023 1840 Full Code [id number]  Eldonna Elspeth PARAS, MD ED   04/30/2023 1659 05/09/2023 2220 Full Code [id number]  Lanetta Lingo, MD ED   03/21/2023 0012 04/01/2023 1618 Full Code [id number]  Tobie Mario GAILS, MD ED   02/13/2023 0822 02/15/2023 1825 Full Code [id number]  Hilma Rankins, MD ED   11/24/2022 2339 11/29/2022 1924 Full Code [id number]  Lanetta Lingo, MD ED   11/15/2022 0928 11/16/2022 1952 Full Code [id number]  Ammon Marsa, MD Inpatient   08/22/2022 2153 08/27/2022 1916 Full Code [id number]  Tobie Mario GAILS, MD ED   06/05/2022 2039 06/07/2022 1712 Full Code [id number]  Cleatus Delayne GAILS, MD ED   09/03/2020 0433 09/05/2020 1806 Full Code [id number]  Mansy, Madison LABOR, MD ED   12/02/2017 1152 12/04/2017 1731 Full Code [id number]  Leora Lynwood SAUNDERS, MD Inpatient   01/25/2017 1402 01/28/2017 2028 Full Code [id number]  Yisroel Sleight, MD ED   01/17/2017 1444 01/18/2017 1754 Full Code [id number]  Marchia Drivers, MD Inpatient   02/04/2015 1534 02/10/2015 2036 Full Code [id number]  Marchia Drivers, MD Inpatient   02/03/2015 1751 02/04/2015 1534 Full Code [id number]  Marchia Drivers, MD Inpatient   02/03/2015 1749 02/03/2015 1750 Full Code [id number]  Marchia Drivers, MD Inpatient   02/03/2015 1338 02/03/2015 1749 Full Code [id number]  Yisroel Sleight, MD ED      Advance Directive Documentation    Flowsheet Row Most Recent Value  Type of Advance Directive Healthcare Power of Attorney  Pre-existing out of facility DNR order (yellow form or pink MOST form) --  MOST Form in Place? --    [name] Lewis, NP  Please contact Palliative Medicine Team phone at [phone] for questions  and concerns.

## 2024-03-20 DIAGNOSIS — I5043 Acute on chronic combined systolic (congestive) and diastolic (congestive) heart failure: Secondary | ICD-10-CM | POA: Diagnosis not present

## 2024-03-20 LAB — BASIC METABOLIC PANEL WITH GFR
Anion gap: 13 (ref 5–15)
BUN: 98 mg/dL — ABNORMAL HIGH (ref 8–23)
CO2: 24 mmol/L (ref 22–32)
Calcium: 8.1 mg/dL — ABNORMAL LOW (ref 8.9–10.3)
Chloride: 99 mmol/L (ref 98–111)
Creatinine, Ser: 2.67 mg/dL — ABNORMAL HIGH (ref 0.44–1.00)
GFR, Estimated: 17 mL/min — ABNORMAL LOW (ref >60)
Glucose, Bld: 103 mg/dL — ABNORMAL HIGH (ref 70–99)
Potassium: 3.8 mmol/L (ref 3.5–5.1)
Sodium: 136 mmol/L (ref 135–145)

## 2024-03-20 LAB — GLUCOSE, CAPILLARY
Glucose-Capillary: 105 mg/dL — ABNORMAL HIGH (ref 70–99)
Glucose-Capillary: 126 mg/dL — ABNORMAL HIGH (ref 70–99)
Glucose-Capillary: 114 mg/dL — ABNORMAL HIGH (ref 70–99)
Glucose-Capillary: 132 mg/dL — ABNORMAL HIGH (ref 70–99)

## 2024-03-20 LAB — CBC
HCT: 34.3 % — ABNORMAL LOW (ref 36.0–46.0)
Hemoglobin: 11.4 g/dL — ABNORMAL LOW (ref 12.0–15.0)
MCH: 31 pg (ref 26.0–34.0)
MCHC: 33.2 g/dL (ref 30.0–36.0)
MCV: 93.2 fL (ref 80.0–100.0)
Platelets: 216 K/uL (ref 150–400)
RBC: 3.68 MIL/uL — ABNORMAL LOW (ref 3.87–5.11)
RDW: 16 % — ABNORMAL HIGH (ref 11.5–15.5)
WBC: 6.3 K/uL (ref 4.0–10.5)
nRBC: 0 % (ref 0.0–0.2)

## 2024-03-20 LAB — PHOSPHORUS: Phosphorus: 3.2 mg/dL (ref 2.5–4.6)

## 2024-03-20 LAB — MAGNESIUM: Magnesium: 2.3 mg/dL (ref 1.7–2.4)

## 2024-03-20 NOTE — Progress Notes (Signed)
 Progress Note   Patient: Leslie Parker FMW:[id number] DOB: 04-21-1933 DOA: 03/11/2024     8 DOS: the patient was seen and examined on 03/20/2024   Brief hospital course: Leslie Parker is a 89 y.o. female with medical history significant of  sCHF with EF 25-30%, SSS (s/p of PPM), A fib on Eliquis , HTN, HLD, DM, COPD, CKD-IV, osteopenia, ileus, who presents with SOB.  Chest x-ray showed cardiomegaly with vascular congestion, small amount of bilateral pleural effusion.  BNP more than 4000.  She was diagnosed with acute on chronic systolic congestive heart failure, was started on IV Lasix .  She is also placed on steroids for bronchospasm.  However, her COVID test came back positive. Patient has received multiple days of IV Lasix , also started on steroids, antibiotics.  Condition finally improving.  Medically stable for discharge.   Principal Problem:   Acute on chronic combined systolic and diastolic congestive heart failure (HCC) Active Problems:   COPD with acute exacerbation (HCC)   Myocardial injury   Paroxysmal atrial fibrillation (HCC)   Type II diabetes mellitus with renal manifestations (HCC)   CKD (chronic kidney disease) stage 4, GFR 15-29 ml/min (HCC)   HTN (hypertension), malignant   Essential hypertension   Fall at home, initial encounter   COVID-19 virus infection   Aspiration pneumonia (HCC)   Hypothermia   Hypothyroidism   Assessment and Plan:  # Bilateral lower lobe aspiration pneumonia. # Dysphagia Patient had an onset of worsening short of breath and chest pain yesterday.  Troponin not significant elevated.  Chest x-ray showed bilateral pleural effusion, with increased bilateral airspace disease. She was placed on Unasyn , but a procalcitonin level not significant elevated.  This may reflect a chemical aspiration.  Change antibiotic to Augmentin  for total abx course of 5 days.  She states that she has chronic dysphagia from esophageal problems, seen by speech therapist.   Not currently a good candidate for EGD.  Will follow-up with GI as outpatient. Continue combivent  inhalers.  Increased dose to every 4 hours.  Patient still has significant upper airway secretion.     # Acute on chronic combined systolic and diastolic congestive heart failure (HCC) # Myocardial injury secondary to congestive heart failure. : Patient has SOB, 1+ leg edema, significantly elevated BNP> 4500, clinically consistent with CHF exacerbation.  2D echo on 08/09/2023 showed EF 25-30%.  Treated with IV Lasix , he is diuresing well, renal function started getting worse. Patient still has significant rhonchi in the bases, I have changed Lasix  to 40 mg daily, give a dose of albumin  8/23.  Renal function stabilized today after holding additional dose of the Lasix  yesterday.   Patient was significant volume overload, s/p IV Lasix .   Due to concern about renal function, Lasix  once a day but with higher dose. 8/27 looks dry today due to decreased oral intake.  Patient was advised to continue adequate water for hydration otherwise we may have to start IV fluid.     Hypothermia.  Resolved  Hypothyroidism. Patient developed hypothermia last night, was put on heating blanket. Started on Synthroid  for hypothyroidism.  Also on steroids. Continue to monitor.   COPD with acute exacerbation (HCC) COVID-19 infection.:  Patient has significant wheezing, this is caused by COVID infection.  Continue steroids.  Continue bronchodilator.  I have scheduled Combivent .  Reduced the rate to prednisone  10 mg daily due to significant hyperglycemia.  Will continue finishing 10 days of steroids course.   HTN (hypertension) -IV hydralazine  as needed -  Coreg , oral hydralazine    Chronic a-fib (HCC):  -Eliquis  -Coreg , amodarone No tachycardia.   CKD (chronic kidney disease) stage 4, GFR 15-29 ml/min (HCC):  Patient renal function has been gradually getting worse, but is starting better today after holding Lasix .  S/p IV Lasix .  Monitor renal function.    Type II diabetes mellitus with renal manifestations (HCC) Uncontrolled type 2 diabetes with hyperglycemia.: Recent A1c 7.0.  Developed significant hyperglycemia secondary to steroid use.  Prednisone  dose decreased.  No longer has significant hyperglycemia.  Continue sliding scale insulin  at lower dose.   Fall at home, initial encounter Skin tear in the right shin. -consult TOC for [facility] need or rehab/SNF placement Family wish to bring patient home on Monday. 8/27 patient's condition is declining, family requested for SNF placement.  Today patient was sleepy and lethargic, able to wake up and answer questions, AO x 3. Decreased appetite and decreased oral intake, looks dry.  Advised to encourage for diet and oral water intake.   Palliative care consulted for goals of care discussion with family.     Subjective:  Patient was seen and examined at bedside.  No significant events overnight. Patient is more awake and alert today, still has generalized weakness, no any other specific complaints. Still has low appetite, encouraged to increase oral intake.   Physical Exam: Vitals:   03/20/24 0500 03/20/24 0524 03/20/24 0728 03/20/24 1219  BP:  (!) 144/83 (!) 140/60 (!) 148/76  Pulse:  62 60 (!) 58  Resp:   18 20  Temp:  (!) 97.5 F (36.4 C) 97.6 F (36.4 C) 97.6 F (36.4 C)  TempSrc:   Oral Oral  SpO2:  91% 94% 93%  Weight: 67.7 kg     Height:       General exam: NAD, looks tired Respiratory system: Equal air entry bilaterally, mild crackles, no wheezes  Cardiovascular system: S1 & S2 heard, RRR. N murmurs. Gastrointestinal system: BS present, NT/ND Central nervous system: No focal deficits, AAOx 3 Extremities: No edema Skin: No rashes, lesions or ulcers Psychiatry: Affect appropriate    Data Reviewed:  Lab results reviewed  Family Communication:  8/28 d/w family at bedside 8/29 no one available at bedside, tried to call  patient's daughter, but no answer.  Disposition: Status is: Inpatient Remains inpatient appropriate because: Severity of disease, IV treatment 8/29 awaiting for SNF placement     Time spent: 38 minutes  Author: Elvan Sor, MD 03/20/2024 3:29 PM  For on call review [email or web address].

## 2024-03-20 NOTE — Progress Notes (Signed)
 Occupational Therapy Treatment Patient Details Name: Leslie Parker MRN: [id number] DOB: 06/11/1933 Today's Date: 03/20/2024   History of present illness Pt is an 89 y.o. female with medical history significant of sCHF with EF 25-30%, SSS (s/p of PPM), A fib on Eliquis , HTN, HLD, DM, COPD, CKD-IV, osteopenia, ileus, who presents with SOB. Pt now testing positive for covid. MD assessment also includes: bilateral lower lobe aspiration pneumonia, dysphagia, acute on chronic combined systolic and diastolic congestive heart failure, myocardial injury, hypothermia, and fall at home with skin tear on the R shin.   OT comments  Upon entering the room, pt supine in bed and agreeable to OT intervention. Pt perseverating on going home and that her Daughter was here to take her home. Pt mentioning this several times during session and needing to be redirected. No family present during this session and no documentation in chart showing plans for discharge today. Pt needing max A for bed mobility. Pt utilizing incentive spirometer with min cues for for proper technique while seated on EOB with close supervision. Pt sits for ~ 7 minutes without LOB. Pt refusing attempts to stand. Lateral scoots along EOB to the L with mod - max A. Sit >supine with mod A. Call bell and all needed items within reach upon exiting the room.       If plan is discharge home, recommend the following:  A little help with walking and/or transfers;A little help with bathing/dressing/bathroom;Assist for transportation;Help with stairs or ramp for entrance;Assistance with cooking/housework   Equipment Recommendations  Other (comment) (defer to next venue of care)    Recommendations for Other Services      Precautions / Restrictions Precautions Precautions: Fall Recall of Precautions/Restrictions: Intact       Mobility Bed Mobility Overal bed mobility: Needs Assistance Bed Mobility: Supine to Sit, Sit to Supine     Supine to sit:  Max assist Sit to supine: Mod assist        Transfers Overall transfer level: Needs assistance Equipment used: None              Lateral/Scoot Transfers: Max assist       Balance Overall balance assessment: Needs assistance Sitting-balance support: Bilateral upper extremity supported, Feet supported Sitting balance-[name]: Poor                                     ADL either performed or assessed with clinical judgement    Extremity/Trunk Assessment Upper Extremity Assessment Upper Extremity Assessment: Generalized weakness   Lower Extremity Assessment Lower Extremity Assessment: Generalized weakness        Vision Patient Visual Report: No change from baseline     Perception     Praxis     Communication Communication Communication: Impaired Factors Affecting Communication: Reduced clarity of speech   Cognition Arousal: Alert Behavior During Therapy: WFL for tasks assessed/performed Cognition: No apparent impairments                               Following commands: Intact        Cueing   Cueing Techniques: Verbal cues, Tactile cues  Exercises              Pertinent Vitals/ Pain       Pain Assessment Pain Assessment: No/denies pain         Frequency  Min 2X/week        Progress Toward Goals  OT Goals(current goals can now be found in the care plan section)  Progress towards OT goals: Progressing toward goals      AM-PAC OT 6 Clicks Daily Activity     Outcome Measure   Help from another person eating meals?: None Help from another person taking care of personal grooming?: None Help from another person toileting, which includes using toliet, bedpan, or urinal?: A Lot Help from another person bathing (including washing, rinsing, drying)?: A Lot Help from another person to put on and taking off regular upper body clothing?: A Lot Help from another person to put on and taking off regular lower  body clothing?: A Lot 6 Click Score: 16    End of Session    OT Visit Diagnosis: Other abnormalities of gait and mobility (R26.89);Muscle weakness (generalized) (M62.81)   Activity Tolerance Patient tolerated treatment well   Patient Left in bed;with call bell/phone within reach;with bed alarm set   Nurse Communication Mobility status        Time: 8560-8498 OT Time Calculation (min): 22 min  Charges: OT General Charges $OT Visit: 1 Visit OT Treatments $Therapeutic Activity: 8-22 mins  Izetta Claude, MS, OTR/L , CBIS ascom [phone]  03/20/24, 3:12 PM

## 2024-03-20 NOTE — Plan of Care (Signed)
  Problem: Respiratory: Goal: Will maintain a patent airway Outcome: Progressing   Problem: Pain Managment: Goal: General experience of comfort will improve and/or be controlled Outcome: Progressing   Problem: Safety: Goal: Ability to remain free from injury will improve Outcome: Progressing   Problem: Clinical Measurements: Goal: Ability to maintain clinical measurements within normal limits will improve Outcome: Progressing Goal: Will remain free from infection Outcome: Progressing Goal: Respiratory complications will improve Outcome: Progressing

## 2024-03-21 DIAGNOSIS — I5043 Acute on chronic combined systolic (congestive) and diastolic (congestive) heart failure: Secondary | ICD-10-CM | POA: Diagnosis not present

## 2024-03-21 LAB — CBC
HCT: 33.7 % — ABNORMAL LOW (ref 36.0–46.0)
Hemoglobin: 11.2 g/dL — ABNORMAL LOW (ref 12.0–15.0)
MCH: 30.7 pg (ref 26.0–34.0)
MCHC: 33.2 g/dL (ref 30.0–36.0)
MCV: 92.3 fL (ref 80.0–100.0)
Platelets: 226 K/uL (ref 150–400)
RBC: 3.65 MIL/uL — ABNORMAL LOW (ref 3.87–5.11)
RDW: 15.9 % — ABNORMAL HIGH (ref 11.5–15.5)
WBC: 6.5 K/uL (ref 4.0–10.5)
nRBC: 0 % (ref 0.0–0.2)

## 2024-03-21 LAB — GLUCOSE, CAPILLARY
Glucose-Capillary: 91 mg/dL (ref 70–99)
Glucose-Capillary: 133 mg/dL — ABNORMAL HIGH (ref 70–99)
Glucose-Capillary: 177 mg/dL — ABNORMAL HIGH (ref 70–99)
Glucose-Capillary: 154 mg/dL — ABNORMAL HIGH (ref 70–99)

## 2024-03-21 LAB — BASIC METABOLIC PANEL WITH GFR
Anion gap: 11 (ref 5–15)
BUN: 92 mg/dL — ABNORMAL HIGH (ref 8–23)
CO2: 24 mmol/L (ref 22–32)
Calcium: 8.2 mg/dL — ABNORMAL LOW (ref 8.9–10.3)
Chloride: 100 mmol/L (ref 98–111)
Creatinine, Ser: 2.33 mg/dL — ABNORMAL HIGH (ref 0.44–1.00)
GFR, Estimated: 20 mL/min — ABNORMAL LOW (ref >60)
Glucose, Bld: 99 mg/dL (ref 70–99)
Potassium: 3.4 mmol/L — ABNORMAL LOW (ref 3.5–5.1)
Sodium: 135 mmol/L (ref 135–145)

## 2024-03-21 LAB — MAGNESIUM: Magnesium: 2.3 mg/dL (ref 1.7–2.4)

## 2024-03-21 LAB — PHOSPHORUS: Phosphorus: 2.9 mg/dL (ref 2.5–4.6)

## 2024-03-21 MED ORDER — MELATONIN 5 MG PO TABS
5.0000 mg | ORAL_TABLET | Freq: Every day | ORAL | Status: DC
  Administered 2024-03-21 – 2024-03-23 (×3): 5 mg via ORAL
  Filled 2024-03-21 (×3): qty 1

## 2024-03-21 MED ORDER — BISACODYL 5 MG PO TBEC
10.0000 mg | DELAYED_RELEASE_TABLET | Freq: Every day | ORAL | Status: DC
  Administered 2024-03-21 – 2024-03-22 (×2): 10 mg via ORAL
  Filled 2024-03-21 (×2): qty 2

## 2024-03-21 MED ORDER — POLYETHYLENE GLYCOL 3350 17 G PO PACK
17.0000 g | PACK | Freq: Every day | ORAL | Status: DC
  Administered 2024-03-21 – 2024-03-23 (×3): 17 g via ORAL
  Filled 2024-03-21 (×4): qty 1

## 2024-03-21 MED ORDER — AMOXICILLIN-POT CLAVULANATE 400-57 MG/5ML PO SUSR
400.0000 mg | Freq: Two times a day (BID) | ORAL | Status: AC
  Administered 2024-03-21: 400 mg via ORAL
  Filled 2024-03-21: qty 5

## 2024-03-21 MED ORDER — IPRATROPIUM-ALBUTEROL 20-100 MCG/ACT IN AERS
1.0000 | INHALATION_SPRAY | Freq: Four times a day (QID) | RESPIRATORY_TRACT | Status: DC
  Administered 2024-03-21 – 2024-03-24 (×11): 1 via RESPIRATORY_TRACT

## 2024-03-21 MED ORDER — BACITRACIN-POLYMYXIN B OP OINT
1.0000 | TOPICAL_OINTMENT | Freq: Three times a day (TID) | OPHTHALMIC | Status: DC
  Administered 2024-03-21 – 2024-03-24 (×10): 1 via OPHTHALMIC
  Filled 2024-03-21: qty 0.4

## 2024-03-21 MED ORDER — BISACODYL 10 MG RE SUPP
10.0000 mg | Freq: Every day | RECTAL | Status: DC | PRN
  Administered 2024-03-22: 10 mg via RECTAL
  Filled 2024-03-21: qty 1

## 2024-03-21 NOTE — Progress Notes (Deleted)
 [facility] LIAISON NOTE  Received request from Shanea Lining, Transitions of Care (TOC), for hospice services at home after discharge. Spoke with Mr. Eilleen at the bedside to initiate education related to hospice philosophy, services, and team approach to care. Patient verbalized understanding of information given. Per discussion, the plan is for discharge home when medically ready.   DME needs discussed.   Patient has the following equipment in the home:  wheelchair  Patient denies any need for other equipment.  We will have patient's hospice admission nurse do a home assessment and order any dme that would be helpful for him in the home.  Patient declined needing a hospital bed and is on no O2.          The address has been verified and is correct in the chart.  Please send signed and completed DNR home with patient/family if applicable.   Please provide prescriptions at discharge as needed to ensure ongoing symptom management.  [facility] information and contact numbers given to patient along with [facility] folder.   Above information shared with Marinda Cooks, TOC and hospital medical care team.  Please call with any hospice related questions or concerns.  Thank you for the opportunity to participate in this patient's care.  [name], MA, BSN, RN, FNE Nurse Liaison [phone]

## 2024-03-21 NOTE — Plan of Care (Signed)
  Problem: Education: Goal: Ability to describe self-care measures that may prevent or decrease complications (Diabetes Survival Skills Education) will improve Outcome: Progressing Goal: Individualized Educational Video(s) Outcome: Progressing   Problem: Coping: Goal: Ability to adjust to condition or change in health will improve Outcome: Progressing   Problem: Fluid Volume: Goal: Ability to maintain a balanced intake and output will improve Outcome: Progressing   Problem: Health Behavior/Discharge Planning: Goal: Ability to identify and utilize available resources and services will improve Outcome: Progressing Goal: Ability to manage health-related needs will improve Outcome: Progressing   Problem: Metabolic: Goal: Ability to maintain appropriate glucose levels will improve Outcome: Progressing   Problem: Nutritional: Goal: Maintenance of adequate nutrition will improve Outcome: Progressing Goal: Progress toward achieving an optimal weight will improve Outcome: Progressing   Problem: Skin Integrity: Goal: Risk for impaired skin integrity will decrease Outcome: Progressing   Problem: Tissue Perfusion: Goal: Adequacy of tissue perfusion will improve Outcome: Progressing   Problem: Education: Goal: Knowledge of risk factors and measures for prevention of condition will improve Outcome: Progressing   Problem: Coping: Goal: Psychosocial and spiritual needs will be supported Outcome: Progressing   Problem: Respiratory: Goal: Will maintain a patent airway Outcome: Progressing Goal: Complications related to the disease process, condition or treatment will be avoided or minimized Outcome: Progressing   Problem: Education: Goal: Knowledge of General Education information will improve Description: Including pain rating scale, medication(s)/side effects and non-pharmacologic comfort measures Outcome: Progressing   Problem: Health Behavior/Discharge Planning: Goal:  Ability to manage health-related needs will improve Outcome: Progressing   Problem: Clinical Measurements: Goal: Ability to maintain clinical measurements within normal limits will improve Outcome: Progressing Goal: Will remain free from infection Outcome: Progressing Goal: Diagnostic test results will improve Outcome: Progressing Goal: Respiratory complications will improve Outcome: Progressing Goal: Cardiovascular complication will be avoided Outcome: Progressing   Problem: Activity: Goal: Risk for activity intolerance will decrease Outcome: Progressing   Problem: Nutrition: Goal: Adequate nutrition will be maintained Outcome: Progressing   Problem: Coping: Goal: Level of anxiety will decrease Outcome: Progressing   Problem: Elimination: Goal: Will not experience complications related to bowel motility Outcome: Progressing Goal: Will not experience complications related to urinary retention Outcome: Progressing   Problem: Pain Managment: Goal: General experience of comfort will improve and/or be controlled Outcome: Progressing   Problem: Safety: Goal: Ability to remain free from injury will improve Outcome: Progressing   Problem: Skin Integrity: Goal: Risk for impaired skin integrity will decrease Outcome: Progressing   Problem: Education: Goal: Ability to demonstrate management of disease process will improve Outcome: Progressing Goal: Ability to verbalize understanding of medication therapies will improve Outcome: Progressing Goal: Individualized Educational Video(s) Outcome: Progressing   Problem: Activity: Goal: Capacity to carry out activities will improve Outcome: Progressing   Problem: Cardiac: Goal: Ability to achieve and maintain adequate cardiopulmonary perfusion will improve Outcome: Progressing   Problem: Education: Goal: Knowledge of disease or condition will improve Outcome: Progressing Goal: Knowledge of the prescribed therapeutic  regimen will improve Outcome: Progressing Goal: Individualized Educational Video(s) Outcome: Progressing   Problem: Activity: Goal: Ability to tolerate increased activity will improve Outcome: Progressing Goal: Will verbalize the importance of balancing activity with adequate rest periods Outcome: Progressing   Problem: Respiratory: Goal: Ability to maintain a clear airway will improve Outcome: Progressing Goal: Levels of oxygenation will improve Outcome: Progressing Goal: Ability to maintain adequate ventilation will improve Outcome: Progressing

## 2024-03-21 NOTE — Progress Notes (Signed)
 [facility] Liaison Note  Ms. Leslie Parker is a current palliative care patient with [facility].  Our triage nurse received a call from the patient's daughter stating that [facility] was discharging her mother today.  [name], lives in [place] and states that her mom lives alone and is not able to live by herself.  I called and spoke with [name] and notified her that I have passed her concerns along to Dr. Von and to [place]- Transition of Care [facility]) covering today.    Per collaboration with the medical team- patient will not discharge today and patient has been offered beds at [facility]TC.  [place] is following up.  Hospital Liaison Team will continue to follow through final disposition.  [name], RN Nurse Liaison [phone]

## 2024-03-21 NOTE — Progress Notes (Signed)
 Physical Therapy Treatment Patient Details Name: Leslie Parker MRN: [id number] DOB: [birth year] Today's Date: 03/21/2024   History of Present Illness Pt is an 89 y.o. female with medical history significant of sCHF with EF 25-30%, SSS (s/p of PPM), A fib on Eliquis , HTN, HLD, DM, COPD, CKD-IV, osteopenia, ileus, who presents with SOB. Pt now testing positive for covid. MD assessment also includes: bilateral lower lobe aspiration pneumonia, dysphagia, acute on chronic combined systolic and diastolic congestive heart failure, myocardial injury, hypothermia, and fall at home with skin tear on the R shin.    PT Comments  Pt was asleep, long sitting in bed, upon arrival. She was on rm air with sao2 96%. Somewhat a little SOB even at rest. Severe congestion but unable to clear secretion  even when encouraged too. Pt remains overall lethargic in presentation. Was able to tolerate getting OOB and ambulating to doorway of room however very fatigued and quickly drifted back to sleep after returning to bed. Pt is incontinent of urine and urinated on the floor during gait training to doorway. Pt's sao2 > 94% on rm air. Overall session greatly limited by pt's activity tolerance. DC recs remain appropriate if plan remains to maximize independence and safety with all ADLs. Acute PT will continue to follow per current POC.    If plan is discharge home, recommend the following: Assistance with cooking/housework;Assist for transportation;A little help with bathing/dressing/bathroom;Supervision due to cognitive status;A lot of help with walking and/or transfers     Equipment Recommendations  None recommended by PT       Precautions / Restrictions Precautions Precautions: Fall Recall of Precautions/Restrictions: Intact Restrictions Weight Bearing Restrictions Per Provider Order: No     Mobility  Bed Mobility Overal bed mobility: Needs Assistance Bed Mobility: Supine to Sit, Sit to Supine  Supine to sit: Min  assist, Mod assist, HOB elevated, Used rails Sit to supine: Mod assist General bed mobility comments: required assistance to achieve EOB sitting from long sitting position, also required assistance to progress BLEs into bed from EOB sitting    Transfers Overall transfer level: Needs assistance Equipment used: Rolling walker (2 wheels) Transfers: Sit to/from Stand Sit to Stand: Min assist  General transfer comment: Pt was able to stand from lowest EOB surface with min assist. vcs for handplacement and technique improvements    Ambulation/Gait Ambulation/Gait assistance: Min assist Gait Distance (Feet): 25 Feet Assistive device: Rolling walker (2 wheels) Gait Pattern/deviations: Step-to pattern, Decreased step length - right, Decreased step length - left, Trunk flexed, Shuffle Gait velocity: decreased  General Gait Details: Pt was able to ambulate to doorway of room and back to bed. sao2 >94% on rm air however pt does endorse fatigue and quickly fell back to sleep once repositioned in bed.    Balance Overall balance assessment: Needs assistance Sitting-balance support: Bilateral upper extremity supported, Feet supported Sitting balance-[name]: Poor     Standing balance support: Bilateral upper extremity supported, During functional activity, Reliant on assistive device for balance Standing balance-[name]: Poor Standing balance comment: pt remains high fall risk       Communication Communication Communication: Impaired Factors Affecting Communication: Reduced clarity of speech  Cognition Arousal: Alert Behavior During Therapy: WFL for tasks assessed/performed   PT - Cognitive impairments: No apparent impairments      PT - Cognition Comments: Pt was asleep upon arrival. easily awakes and stays awake for limited session howevr as soon as she returned to bed, quickly fell bacl asleep. She  did knoe she was in hospital with covid however lacks insight of overall situation  and medical concerns. Following commands: Intact      Cueing Cueing Techniques: Verbal cues, Tactile cues         Pertinent Vitals/Pain Pain Assessment Pain Assessment: No/denies pain Faces Pain Scale: No hurt     PT Goals (current goals can now be found in the care plan section) Acute Rehab PT Goals Patient Stated Goal: none stated Progress towards PT goals: Progressing toward goals    Frequency    Min 2X/week       AM-PAC PT 6 Clicks Mobility   Outcome Measure  Help needed turning from your back to your side while in a flat bed without using bedrails?: A Lot Help needed moving from lying on your back to sitting on the side of a flat bed without using bedrails?: A Lot Help needed moving to and from a bed to a chair (including a wheelchair)?: A Lot Help needed standing up from a chair using your arms (e.g., wheelchair or bedside chair)?: A Lot Help needed to walk in hospital room?: A Lot Help needed climbing 3-5 steps with a railing? : A Lot 6 Click Score: 12    End of Session   Activity Tolerance: Patient limited by fatigue;Patient limited by lethargy Patient left: in bed;with call bell/phone within reach;with bed alarm set Nurse Communication: Mobility status PT Visit Diagnosis: Muscle weakness (generalized) (M62.81);Difficulty in walking, not elsewhere classified (R26.2);Unsteadiness on feet (R26.81);Repeated falls (R29.6) Pain - Right/Left: Right Pain - part of body: Leg     Time: 1130-1147 PT Time Calculation (min) (ACUTE ONLY): 17 min  Charges:    $Therapeutic Activity: 8-22 mins PT General Charges $$ ACUTE PT VISIT: 1 Visit                     Rankin Essex PTA 03/21/24, 12:55 PM

## 2024-03-21 NOTE — Plan of Care (Signed)
  Problem: Education: Goal: Ability to describe self-care measures that may prevent or decrease complications (Diabetes Survival Skills Education) will improve Outcome: Progressing Goal: Individualized Educational Video(s) Outcome: Progressing   Problem: Coping: Goal: Ability to adjust to condition or change in health will improve Outcome: Progressing   Problem: Fluid Volume: Goal: Ability to maintain a balanced intake and output will improve Outcome: Progressing   Problem: Health Behavior/Discharge Planning: Goal: Ability to identify and utilize available resources and services will improve Outcome: Progressing Goal: Ability to manage health-related needs will improve Outcome: Progressing   Problem: Metabolic: Goal: Ability to maintain appropriate glucose levels will improve Outcome: Progressing   Problem: Nutritional: Goal: Maintenance of adequate nutrition will improve Outcome: Progressing Goal: Progress toward achieving an optimal weight will improve Outcome: Progressing   Problem: Skin Integrity: Goal: Risk for impaired skin integrity will decrease Outcome: Progressing   Problem: Tissue Perfusion: Goal: Adequacy of tissue perfusion will improve Outcome: Progressing   Problem: Education: Goal: Knowledge of risk factors and measures for prevention of condition will improve Outcome: Progressing   Problem: Coping: Goal: Psychosocial and spiritual needs will be supported Outcome: Progressing   Problem: Respiratory: Goal: Will maintain a patent airway Outcome: Progressing Goal: Complications related to the disease process, condition or treatment will be avoided or minimized Outcome: Progressing   Problem: Education: Goal: Knowledge of General Education information will improve Description: Including pain rating scale, medication(s)/side effects and non-pharmacologic comfort measures Outcome: Progressing   Problem: Health Behavior/Discharge Planning: Goal:  Ability to manage health-related needs will improve Outcome: Progressing   Problem: Clinical Measurements: Goal: Ability to maintain clinical measurements within normal limits will improve Outcome: Progressing Goal: Will remain free from infection Outcome: Progressing Goal: Diagnostic test results will improve Outcome: Progressing Goal: Respiratory complications will improve Outcome: Progressing Goal: Cardiovascular complication will be avoided Outcome: Progressing   Problem: Activity: Goal: Risk for activity intolerance will decrease Outcome: Progressing   Problem: Nutrition: Goal: Adequate nutrition will be maintained Outcome: Progressing   Problem: Coping: Goal: Level of anxiety will decrease Outcome: Progressing   Problem: Elimination: Goal: Will not experience complications related to bowel motility Outcome: Progressing Goal: Will not experience complications related to urinary retention Outcome: Progressing   Problem: Pain Managment: Goal: General experience of comfort will improve and/or be controlled Outcome: Progressing   Problem: Safety: Goal: Ability to remain free from injury will improve Outcome: Progressing   Problem: Skin Integrity: Goal: Risk for impaired skin integrity will decrease Outcome: Progressing   Problem: Education: Goal: Ability to demonstrate management of disease process will improve Outcome: Progressing Goal: Ability to verbalize understanding of medication therapies will improve Outcome: Progressing Goal: Individualized Educational Video(s) Outcome: Progressing   Problem: Activity: Goal: Capacity to carry out activities will improve Outcome: Progressing   Problem: Cardiac: Goal: Ability to achieve and maintain adequate cardiopulmonary perfusion will improve Outcome: Progressing   Problem: Education: Goal: Knowledge of disease or condition will improve Outcome: Progressing Goal: Knowledge of the prescribed therapeutic  regimen will improve Outcome: Progressing Goal: Individualized Educational Video(s) Outcome: Progressing   Problem: Activity: Goal: Ability to tolerate increased activity will improve Outcome: Progressing Goal: Will verbalize the importance of balancing activity with adequate rest periods Outcome: Progressing

## 2024-03-21 NOTE — TOC Progression Note (Addendum)
 Transition of Care [facility]) - Progression Note    Patient Details  Name: Leslie Parker MRN: [id number] Date of Birth: 05/04/1933  Transition of Care [facility]) CM/SW Contact  [place] L Lauraann Missey, [place] Phone Number: 03/21/2024, 4:12 PM  Clinical Narrative:     CSW spoke with [name], daughter of patient. [name] is not a POA. [name], the grand daughter, is the POA. CSW advised that there are two facilities offering a bed. CSW advised that if patient would go to [facility], a co-pay of $2933.00 is needed or [facility] can accept patient as they do not chase co-pay.  CSW suggested that Ms. Ford, the [employer] (daughter) have a conversation so that thoughts and plans are more aligned. CSW attempted to call [name] but she did not answer the phone.   A choice needs to made regarding placement. Family needs to pay the co-pay. Patient does not have any medicare days left as she has not had 60 days of wellness.   4:18pm-CSW received a call from [name] advising that she will choose the bed at [facility]. CSW explained the financial implications of choosing [facility]. [name] advised that she understood and she asked that CSW move forward with insurance auth for [facility].                     Expected Discharge Plan and Services         Expected Discharge Date: 03/17/24                                     Social Drivers of Health (SDOH) Interventions SDOH Screenings   Food Insecurity: No Food Insecurity (03/12/2024)  Housing: Low Risk  (03/12/2024)  Transportation Needs: No Transportation Needs (03/12/2024)  Utilities: Not At Risk (03/12/2024)  Alcohol Screen: Low Risk  (08/20/2023)  Financial Resource Strain: Low Risk  (01/13/2024)   Received from [facility]  Social Connections: Moderately Isolated (03/12/2024)  Tobacco Use: Low Risk  (03/11/2024)    Readmission Risk Interventions    10/22/2023    2:28 PM 09/16/2023    2:52 PM 08/20/2023    3:45 PM   Readmission Risk Prevention Plan  Transportation Screening Complete Complete Complete  Medication Review [employer]) Complete Complete Complete  PCP or Specialist appointment within 3-5 days of discharge  Complete Complete  HRI or Home Care Consult Not Complete Patient refused Complete  HRI or Home Care Consult Pt Refusal Comments No PT recs at this time.    SW Recovery Care/Counseling Consult Complete Complete Complete  Palliative Care Screening Not Applicable Not Applicable Not Applicable  Skilled Nursing Facility Not Applicable Not Applicable Not Applicable

## 2024-03-21 NOTE — Progress Notes (Signed)
 Progress Note   Patient: Leslie Parker FMW:[id number] DOB: 1932/08/17 DOA: 03/11/2024     9 DOS: the patient was seen and examined on 03/21/2024   Brief hospital course: Leslie Parker is a 89 y.o. female with medical history significant of  sCHF with EF 25-30%, SSS (s/p of PPM), A fib on Eliquis , HTN, HLD, DM, COPD, CKD-IV, osteopenia, ileus, who presents with SOB.  Chest x-ray showed cardiomegaly with vascular congestion, small amount of bilateral pleural effusion.  BNP more than 4000.  She was diagnosed with acute on chronic systolic congestive heart failure, was started on IV Lasix .  She is also placed on steroids for bronchospasm.  However, her COVID test came back positive. Patient has received multiple days of IV Lasix , also started on steroids, antibiotics.  Condition finally improving.  Medically stable for discharge.   Principal Problem:   Acute on chronic combined systolic and diastolic congestive heart failure (HCC) Active Problems:   COPD with acute exacerbation (HCC)   Myocardial injury   Paroxysmal atrial fibrillation (HCC)   Type II diabetes mellitus with renal manifestations (HCC)   CKD (chronic kidney disease) stage 4, GFR 15-29 ml/min (HCC)   HTN (hypertension), malignant   Essential hypertension   Fall at home, initial encounter   COVID-19 virus infection   Aspiration pneumonia (HCC)   Hypothermia   Hypothyroidism   Assessment and Plan:  # Bilateral lower lobe aspiration pneumonia. # Dysphagia Patient had an onset of worsening short of breath and chest pain yesterday.  Troponin not significant elevated.  Chest x-ray showed bilateral pleural effusion, with increased bilateral airspace disease. She was placed on Unasyn , but a procalcitonin level not significant elevated.  This may reflect a chemical aspiration.  Change antibiotic to Augmentin  for total abx course of 7 days.  She states that she has chronic dysphagia from esophageal problems, seen by speech therapist.   Not currently a good candidate for EGD.  Will follow-up with GI as outpatient. Continue combivent  inhalers.  Increased dose to every 4 hours.  Patient still has significant upper airway secretion.     # Acute on chronic combined systolic and diastolic congestive heart failure (HCC) # Myocardial injury secondary to congestive heart failure. : Patient has SOB, 1+ leg edema, significantly elevated BNP> 4500, clinically consistent with CHF exacerbation.  2D echo on 08/09/2023 showed EF 25-30%.  Treated with IV Lasix , he is diuresing well, renal function started getting worse. Patient still has significant rhonchi in the bases, I have changed Lasix  to 40 mg daily, give a dose of albumin  8/23.  Renal function stabilized today after holding additional dose of the Lasix  yesterday.   Patient was significant volume overload, s/p IV Lasix .   Due to concern about renal function, Lasix  once a day but with higher dose. 8/27 looks dry today due to decreased oral intake.  Patient was advised to continue adequate water for hydration otherwise we may have to start IV fluid.     Hypothermia.  Resolved  Hypothyroidism. Patient developed hypothermia last night, was put on heating blanket. Started on Synthroid  for hypothyroidism.  Also on steroids. Continue to monitor.   COPD with acute exacerbation (HCC) COVID-19 infection.:  Patient has significant wheezing, this is caused by COVID infection.  Continue steroids.  Continue bronchodilator.  I have scheduled Combivent .  Reduced the rate to prednisone  10 mg daily due to significant hyperglycemia.  Will continue finishing 10 days of steroids course.   HTN (hypertension) -IV hydralazine  as needed -  Coreg , oral hydralazine    Chronic a-fib (HCC):  -Eliquis  -Coreg , amodarone No tachycardia.   CKD (chronic kidney disease) stage 4, GFR 15-29 ml/min (HCC):  Patient renal function has been gradually getting worse, but is starting better today after holding Lasix .  S/p IV Lasix .  Monitor renal function.    Type II diabetes mellitus with renal manifestations (HCC) Uncontrolled type 2 diabetes with hyperglycemia.: Recent A1c 7.0.  Developed significant hyperglycemia secondary to steroid use.  Prednisone  dose decreased.  No longer has significant hyperglycemia.  Continue sliding scale insulin  at lower dose.   Fall at home, initial encounter Skin tear in the right shin. -consult TOC for [facility] need or rehab/SNF placement Family wish to bring patient home on Monday. 8/27 patient's condition is declining, family requested for SNF placement.  Today patient was sleepy and lethargic, able to wake up and answer questions, AO x 3. Decreased appetite and decreased oral intake, looks dry.  Advised to encourage for diet and oral water intake.   Palliative care consulted for goals of care discussion with family.     Subjective:  Patient was seen and examined at bedside.  No significant events overnight. Patient is awake and alert, still has mild productive cough, no new worsening of symptoms.    Physical Exam: Vitals:   03/21/24 0433 03/21/24 0434 03/21/24 0840 03/21/24 1200  BP:   124/73 137/67  Pulse:   67 61  Resp:   18 18  Temp:   97.7 F (36.5 C) (!) 97.5 F (36.4 C)  TempSrc:   Oral Axillary  SpO2:   95% 98%  Weight: 67.5 kg 67.5 kg    Height:       General exam: NAD, looks tired Respiratory system: Equal air entry bilaterally, mild crackles, no wheezes  Cardiovascular system: S1 & S2 heard, RRR. N murmurs. Gastrointestinal system: BS present, NT/ND Central nervous system: No focal deficits, AAOx 3 Extremities: No edema Skin: No rashes, lesions or ulcers Psychiatry: Affect appropriate    Data Reviewed:  Lab results reviewed  Family Communication:  8/28 d/w family at bedside 8/29 no one available at bedside, tried to call patient's daughter, but no answer. 8/30 d/w patient's daughter over the phone.  Disposition: Status is:  Inpatient Remains inpatient appropriate because: Severity of disease, IV treatment 8/29 awaiting for SNF placement     Time spent: 38 minutes  Author: Elvan Sor, MD 03/21/2024 2:43 PM  For on call review [email or web address].

## 2024-03-22 DIAGNOSIS — I5043 Acute on chronic combined systolic (congestive) and diastolic (congestive) heart failure: Secondary | ICD-10-CM | POA: Diagnosis not present

## 2024-03-22 LAB — CBC
HCT: 35.5 % — ABNORMAL LOW (ref 36.0–46.0)
Hemoglobin: 11.7 g/dL — ABNORMAL LOW (ref 12.0–15.0)
MCH: 30.9 pg (ref 26.0–34.0)
MCHC: 33 g/dL (ref 30.0–36.0)
MCV: 93.7 fL (ref 80.0–100.0)
Platelets: 222 K/uL (ref 150–400)
RBC: 3.79 MIL/uL — ABNORMAL LOW (ref 3.87–5.11)
RDW: 15.9 % — ABNORMAL HIGH (ref 11.5–15.5)
WBC: 6.8 K/uL (ref 4.0–10.5)
nRBC: 0 % (ref 0.0–0.2)

## 2024-03-22 LAB — BASIC METABOLIC PANEL WITH GFR
Anion gap: 11 (ref 5–15)
BUN: 92 mg/dL — ABNORMAL HIGH (ref 8–23)
CO2: 24 mmol/L (ref 22–32)
Calcium: 8.3 mg/dL — ABNORMAL LOW (ref 8.9–10.3)
Chloride: 102 mmol/L (ref 98–111)
Creatinine, Ser: 2.44 mg/dL — ABNORMAL HIGH (ref 0.44–1.00)
GFR, Estimated: 18 mL/min — ABNORMAL LOW (ref >60)
Glucose, Bld: 117 mg/dL — ABNORMAL HIGH (ref 70–99)
Potassium: 3.6 mmol/L (ref 3.5–5.1)
Sodium: 137 mmol/L (ref 135–145)

## 2024-03-22 LAB — PHOSPHORUS: Phosphorus: 2.8 mg/dL (ref 2.5–4.6)

## 2024-03-22 LAB — MAGNESIUM: Magnesium: 2.3 mg/dL (ref 1.7–2.4)

## 2024-03-22 LAB — VITAMIN B12: Vitamin B-12: >4000 pg/mL — ABNORMAL HIGH (ref 180–914)

## 2024-03-22 LAB — GLUCOSE, CAPILLARY
Glucose-Capillary: 100 mg/dL — ABNORMAL HIGH (ref 70–99)
Glucose-Capillary: 177 mg/dL — ABNORMAL HIGH (ref 70–99)
Glucose-Capillary: 177 mg/dL — ABNORMAL HIGH (ref 70–99)
Glucose-Capillary: 141 mg/dL — ABNORMAL HIGH (ref 70–99)

## 2024-03-22 MED ORDER — HYDROCOD POLI-CHLORPHE POLI ER 10-8 MG/5ML PO SUER
5.0000 mL | Freq: Two times a day (BID) | ORAL | Status: DC | PRN
  Administered 2024-03-23: 5 mL via ORAL
  Filled 2024-03-22: qty 5

## 2024-03-22 MED ORDER — HALOPERIDOL LACTATE 5 MG/ML IJ SOLN
2.0000 mg | Freq: Four times a day (QID) | INTRAMUSCULAR | Status: DC | PRN
  Administered 2024-03-22: 2 mg via INTRAMUSCULAR
  Filled 2024-03-22: qty 1

## 2024-03-22 MED ORDER — QUETIAPINE FUMARATE 25 MG PO TABS
25.0000 mg | ORAL_TABLET | Freq: Every day | ORAL | Status: AC
  Administered 2024-03-22: 25 mg via ORAL
  Filled 2024-03-22: qty 1

## 2024-03-22 MED ORDER — ONDANSETRON HCL 4 MG PO TABS
4.0000 mg | ORAL_TABLET | Freq: Four times a day (QID) | ORAL | Status: DC | PRN
  Filled 2024-03-22: qty 1

## 2024-03-22 NOTE — Progress Notes (Signed)
 Progress Note   Patient: Leslie Parker FMW:[id number] DOB: 1933/06/22 DOA: 03/11/2024     10 DOS: the patient was seen and examined on 03/22/2024   Brief hospital course: Leslie Parker is a 89 y.o. female with medical history significant of  sCHF with EF 25-30%, SSS (s/p of PPM), A fib on Eliquis , HTN, HLD, DM, COPD, CKD-IV, osteopenia, ileus, who presents with SOB.  Chest x-ray showed cardiomegaly with vascular congestion, small amount of bilateral pleural effusion.  BNP more than 4000.  She was diagnosed with acute on chronic systolic congestive heart failure, was started on IV Lasix .  She is also placed on steroids for bronchospasm.  However, her COVID test came back positive. Patient has received multiple days of IV Lasix , also started on steroids, antibiotics.  Condition finally improving.  Medically stable for discharge.   Principal Problem:   Acute on chronic combined systolic and diastolic congestive heart failure (HCC) Active Problems:   COPD with acute exacerbation (HCC)   Myocardial injury   Paroxysmal atrial fibrillation (HCC)   Type II diabetes mellitus with renal manifestations (HCC)   CKD (chronic kidney disease) stage 4, GFR 15-29 ml/min (HCC)   HTN (hypertension), malignant   Essential hypertension   Fall at home, initial encounter   COVID-19 virus infection   Aspiration pneumonia (HCC)   Hypothermia   Hypothyroidism   Assessment and Plan:  # Bilateral lower lobe aspiration pneumonia. # Dysphagia Patient had an onset of worsening short of breath and chest pain yesterday.  Troponin not significant elevated.  Chest x-ray showed bilateral pleural effusion, with increased bilateral airspace disease. She was placed on Unasyn , but a procalcitonin level not significant elevated.  This may reflect a chemical aspiration.  S/p Augmentin  x 14 days total. She states that she has chronic dysphagia from esophageal problems, seen by speech therapist.  Not currently a good candidate  for EGD.  follow-up with GI as outpatient. Continue combivent  inhalers q6h.  Continue Mucinex  600 mg po bid and Tussionex prn 8/31 still has mild productive cough, overall feels improvement     # Acute on chronic combined systolic and diastolic congestive heart failure (HCC) # Myocardial injury secondary to congestive heart failure. Patient has SOB, 1+ leg edema, significantly elevated BNP> 4500, clinically consistent with CHF exacerbation.  2D echo on 08/09/2023 showed EF 25-30%.  Patient was significant volume overload, s/p IV Lasix .   S/p IV lasix  and Albumin  given on 8/23 Due to concern about renal function, d/c'd Lasix   8/27 looks dry today due to decreased oral intake.  Patient was advised to continue adequate water for hydration otherwise we may have to start IV fluid.     Hypothermia.  Resolved  Hypothyroidism. Patient developed hypothermia last night, was put on heating blanket. Started on Synthroid  for hypothyroidism.   Continue to monitor.   # COPD with acute exacerbation (HCC) # COVID-19 infection: PCR positive on 8/21 Continue Combivent  every 6 hourly, Mucinex  6 mg p.o. twice daily, Tussionex prn S/p low-dose prednisone  10 mg p.o. daily for 10 days.  Steroids given and low-dose due to his significant hyperglycemia.  Completed course.  # HTN (hypertension) -IV hydralazine  as needed -Coreg , oral hydralazine    # Chronic a-fib:  -Eliquis  -Coreg , amodarone No tachycardia.   # CKD (chronic kidney disease) stage 4, GFR 15-29 ml/min (HCC):  Patient renal function has been gradually getting worse, but is starting better today after holding Lasix . S/p IV Lasix .  Monitor renal function.    #  Type II diabetes mellitus with renal manifestations (HCC) Uncontrolled type 2 diabetes with hyperglycemia.: Recent A1c 7.0.  Developed significant hyperglycemia secondary to steroid use.  Prednisone  dose decreased.  No longer has significant hyperglycemia.  Continue sliding scale  insulin  at lower dose.   # Fall at home, initial encounter # Skin tear in the right shin. -consult TOC for HH need or rehab/SNF placement Family wish to bring patient home on Monday. 8/27 patient's condition is declining, family requested for SNF placement.  Today patient was sleepy and lethargic, able to wake up and answer questions, AO x 3. Decreased appetite and decreased oral intake, looks dry.  Advised to encourage for diet and oral water intake.   Palliative care consulted for goals of care discussion with family.     Subjective:  Patient was seen and examined at bedside.  No significant events overnight. Patient is awake and alert, still has mild productive cough, no new worsening of symptoms. Overall patient feels improvement, still has generalized weakness, agrees for SNF placement.  Physical Exam: Vitals:   03/21/24 2019 03/22/24 0004 03/22/24 0500 03/22/24 0804  BP: (!) 144/76 (!) 158/70  136/67  Pulse: 60 (!) 59  63  Resp: 18 (!) 24  19  Temp: (!) 97.5 F (36.4 C) 97.6 F (36.4 C)  97.9 F (36.6 C)  TempSrc:    Axillary  SpO2: 98% 97%  94%  Weight:   (!) 149.5 kg   Height:       General exam: NAD, looks tired Respiratory system: Equal air entry bilaterally, mild crackles, no wheezes  Cardiovascular system: S1 & S2 heard, RRR. N murmurs. Gastrointestinal system: BS present, NT/ND Central nervous system: No focal deficits, AAOx 3 Extremities: No edema Skin: No rashes, lesions or ulcers Psychiatry: Affect appropriate    Data Reviewed:  Lab results reviewed  Family Communication:  8/28 d/w family at bedside 8/29 no one available at bedside, tried to call patient's daughter, but no answer. 8/30 d/w patient's daughter over the phone.  Disposition: Status is: Inpatient Remains inpatient appropriate because: Severity of disease, IV treatment 8/29 awaiting for SNF placement     Time spent: 35 minutes  Author: Elvan Sor, MD 03/22/2024 12:11  PM  For on call review [email or web address].

## 2024-03-22 NOTE — Plan of Care (Signed)
  Problem: Coping: Goal: Ability to adjust to condition or change in health will improve Outcome: Adequate for Discharge   

## 2024-03-22 NOTE — Plan of Care (Signed)
  Problem: Education: Goal: Ability to describe self-care measures that may prevent or decrease complications (Diabetes Survival Skills Education) will improve Outcome: Progressing Goal: Individualized Educational Video(s) Outcome: Progressing   Problem: Coping: Goal: Ability to adjust to condition or change in health will improve Outcome: Progressing   Problem: Fluid Volume: Goal: Ability to maintain a balanced intake and output will improve Outcome: Progressing   Problem: Health Behavior/Discharge Planning: Goal: Ability to identify and utilize available resources and services will improve Outcome: Progressing Goal: Ability to manage health-related needs will improve Outcome: Progressing   Problem: Metabolic: Goal: Ability to maintain appropriate glucose levels will improve Outcome: Progressing   Problem: Nutritional: Goal: Maintenance of adequate nutrition will improve Outcome: Progressing Goal: Progress toward achieving an optimal weight will improve Outcome: Progressing   Problem: Skin Integrity: Goal: Risk for impaired skin integrity will decrease Outcome: Progressing   Problem: Tissue Perfusion: Goal: Adequacy of tissue perfusion will improve Outcome: Progressing   Problem: Education: Goal: Knowledge of risk factors and measures for prevention of condition will improve Outcome: Progressing   Problem: Coping: Goal: Psychosocial and spiritual needs will be supported Outcome: Progressing   Problem: Respiratory: Goal: Will maintain a patent airway Outcome: Progressing Goal: Complications related to the disease process, condition or treatment will be avoided or minimized Outcome: Progressing   Problem: Education: Goal: Knowledge of General Education information will improve Description: Including pain rating scale, medication(s)/side effects and non-pharmacologic comfort measures Outcome: Progressing   Problem: Health Behavior/Discharge Planning: Goal:  Ability to manage health-related needs will improve Outcome: Progressing   Problem: Clinical Measurements: Goal: Ability to maintain clinical measurements within normal limits will improve Outcome: Progressing Goal: Will remain free from infection Outcome: Progressing Goal: Diagnostic test results will improve Outcome: Progressing Goal: Respiratory complications will improve Outcome: Progressing Goal: Cardiovascular complication will be avoided Outcome: Progressing   Problem: Activity: Goal: Risk for activity intolerance will decrease Outcome: Progressing   Problem: Nutrition: Goal: Adequate nutrition will be maintained Outcome: Progressing   Problem: Coping: Goal: Level of anxiety will decrease Outcome: Progressing   Problem: Elimination: Goal: Will not experience complications related to bowel motility Outcome: Progressing Goal: Will not experience complications related to urinary retention Outcome: Progressing   Problem: Pain Managment: Goal: General experience of comfort will improve and/or be controlled Outcome: Progressing   Problem: Safety: Goal: Ability to remain free from injury will improve Outcome: Progressing   Problem: Skin Integrity: Goal: Risk for impaired skin integrity will decrease Outcome: Progressing   Problem: Education: Goal: Ability to demonstrate management of disease process will improve Outcome: Progressing Goal: Ability to verbalize understanding of medication therapies will improve Outcome: Progressing Goal: Individualized Educational Video(s) Outcome: Progressing   Problem: Activity: Goal: Capacity to carry out activities will improve Outcome: Progressing   Problem: Cardiac: Goal: Ability to achieve and maintain adequate cardiopulmonary perfusion will improve Outcome: Progressing   Problem: Education: Goal: Knowledge of disease or condition will improve Outcome: Progressing Goal: Knowledge of the prescribed therapeutic  regimen will improve Outcome: Progressing Goal: Individualized Educational Video(s) Outcome: Progressing   Problem: Activity: Goal: Ability to tolerate increased activity will improve Outcome: Progressing Goal: Will verbalize the importance of balancing activity with adequate rest periods Outcome: Progressing   Problem: Respiratory: Goal: Ability to maintain a clear airway will improve Outcome: Progressing Goal: Levels of oxygenation will improve Outcome: Progressing Goal: Ability to maintain adequate ventilation will improve Outcome: Progressing

## 2024-03-23 DIAGNOSIS — I5043 Acute on chronic combined systolic (congestive) and diastolic (congestive) heart failure: Secondary | ICD-10-CM

## 2024-03-23 LAB — CBC
HCT: 35.2 % — ABNORMAL LOW (ref 36.0–46.0)
Hemoglobin: 11.5 g/dL — ABNORMAL LOW (ref 12.0–15.0)
MCH: 30.4 pg (ref 26.0–34.0)
MCHC: 32.7 g/dL (ref 30.0–36.0)
MCV: 93.1 fL (ref 80.0–100.0)
Platelets: 215 K/uL (ref 150–400)
RBC: 3.78 MIL/uL — ABNORMAL LOW (ref 3.87–5.11)
RDW: 15.9 % — ABNORMAL HIGH (ref 11.5–15.5)
WBC: 8.4 K/uL (ref 4.0–10.5)
nRBC: 0 % (ref 0.0–0.2)

## 2024-03-23 LAB — GLUCOSE, CAPILLARY
Glucose-Capillary: 95 mg/dL (ref 70–99)
Glucose-Capillary: 104 mg/dL — ABNORMAL HIGH (ref 70–99)
Glucose-Capillary: 148 mg/dL — ABNORMAL HIGH (ref 70–99)
Glucose-Capillary: 167 mg/dL — ABNORMAL HIGH (ref 70–99)

## 2024-03-23 LAB — BASIC METABOLIC PANEL WITH GFR
Anion gap: 10 (ref 5–15)
BUN: 94 mg/dL — ABNORMAL HIGH (ref 8–23)
CO2: 25 mmol/L (ref 22–32)
Calcium: 8.2 mg/dL — ABNORMAL LOW (ref 8.9–10.3)
Chloride: 99 mmol/L (ref 98–111)
Creatinine, Ser: 2.36 mg/dL — ABNORMAL HIGH (ref 0.44–1.00)
GFR, Estimated: 19 mL/min — ABNORMAL LOW (ref >60)
Glucose, Bld: 109 mg/dL — ABNORMAL HIGH (ref 70–99)
Potassium: 3.9 mmol/L (ref 3.5–5.1)
Sodium: 134 mmol/L — ABNORMAL LOW (ref 135–145)

## 2024-03-23 MED ORDER — CHLORHEXIDINE GLUCONATE CLOTH 2 % EX PADS
6.0000 | MEDICATED_PAD | Freq: Every day | CUTANEOUS | Status: DC
  Administered 2024-03-23 – 2024-03-24 (×2): 6 via TOPICAL

## 2024-03-23 NOTE — Plan of Care (Signed)
  Problem: Education: Goal: Ability to describe self-care measures that may prevent or decrease complications (Diabetes Survival Skills Education) will improve Outcome: Progressing Goal: Individualized Educational Video(s) Outcome: Progressing   Problem: Coping: Goal: Ability to adjust to condition or change in health will improve Outcome: Progressing   Problem: Fluid Volume: Goal: Ability to maintain a balanced intake and output will improve Outcome: Progressing   Problem: Health Behavior/Discharge Planning: Goal: Ability to identify and utilize available resources and services will improve Outcome: Progressing Goal: Ability to manage health-related needs will improve Outcome: Progressing   Problem: Metabolic: Goal: Ability to maintain appropriate glucose levels will improve Outcome: Progressing   Problem: Nutritional: Goal: Maintenance of adequate nutrition will improve Outcome: Progressing Goal: Progress toward achieving an optimal weight will improve Outcome: Progressing   Problem: Skin Integrity: Goal: Risk for impaired skin integrity will decrease Outcome: Progressing   Problem: Tissue Perfusion: Goal: Adequacy of tissue perfusion will improve Outcome: Progressing   Problem: Education: Goal: Knowledge of risk factors and measures for prevention of condition will improve Outcome: Progressing   Problem: Coping: Goal: Psychosocial and spiritual needs will be supported Outcome: Progressing   Problem: Respiratory: Goal: Will maintain a patent airway Outcome: Progressing Goal: Complications related to the disease process, condition or treatment will be avoided or minimized Outcome: Progressing   Problem: Education: Goal: Knowledge of General Education information will improve Description: Including pain rating scale, medication(s)/side effects and non-pharmacologic comfort measures Outcome: Progressing   Problem: Health Behavior/Discharge Planning: Goal:  Ability to manage health-related needs will improve Outcome: Progressing   Problem: Clinical Measurements: Goal: Ability to maintain clinical measurements within normal limits will improve Outcome: Progressing Goal: Will remain free from infection Outcome: Progressing Goal: Diagnostic test results will improve Outcome: Progressing Goal: Respiratory complications will improve Outcome: Progressing Goal: Cardiovascular complication will be avoided Outcome: Progressing   Problem: Activity: Goal: Risk for activity intolerance will decrease Outcome: Progressing   Problem: Nutrition: Goal: Adequate nutrition will be maintained Outcome: Progressing   Problem: Coping: Goal: Level of anxiety will decrease Outcome: Progressing   Problem: Elimination: Goal: Will not experience complications related to bowel motility Outcome: Progressing Goal: Will not experience complications related to urinary retention Outcome: Progressing   Problem: Pain Managment: Goal: General experience of comfort will improve and/or be controlled Outcome: Progressing   Problem: Safety: Goal: Ability to remain free from injury will improve Outcome: Progressing   Problem: Skin Integrity: Goal: Risk for impaired skin integrity will decrease Outcome: Progressing   Problem: Education: Goal: Ability to demonstrate management of disease process will improve Outcome: Progressing Goal: Ability to verbalize understanding of medication therapies will improve Outcome: Progressing Goal: Individualized Educational Video(s) Outcome: Progressing   Problem: Activity: Goal: Capacity to carry out activities will improve Outcome: Progressing   Problem: Cardiac: Goal: Ability to achieve and maintain adequate cardiopulmonary perfusion will improve Outcome: Progressing   Problem: Education: Goal: Knowledge of disease or condition will improve Outcome: Progressing Goal: Knowledge of the prescribed therapeutic  regimen will improve Outcome: Progressing Goal: Individualized Educational Video(s) Outcome: Progressing   Problem: Activity: Goal: Ability to tolerate increased activity will improve Outcome: Progressing Goal: Will verbalize the importance of balancing activity with adequate rest periods Outcome: Progressing   Problem: Respiratory: Goal: Ability to maintain a clear airway will improve Outcome: Progressing Goal: Levels of oxygenation will improve Outcome: Progressing Goal: Ability to maintain adequate ventilation will improve Outcome: Progressing

## 2024-03-23 NOTE — Progress Notes (Signed)
 Progress Note   Patient: Leslie Parker FMW:[id number] DOB: 03/05/1933 DOA: 03/11/2024     11 DOS: the patient was seen and examined on 03/23/2024   Brief hospital course: TINSLEY EVERMAN is a 89 y.o. female with medical history significant of  sCHF with EF 25-30%, SSS (s/p of PPM), A fib on Eliquis , HTN, HLD, DM, COPD, CKD-IV, osteopenia, ileus, who presents with SOB.  Chest x-ray showed cardiomegaly with vascular congestion, small amount of bilateral pleural effusion.  BNP more than 4000.  She was diagnosed with acute on chronic systolic congestive heart failure, was started on IV Lasix .  She is also placed on steroids for bronchospasm.  However, her COVID test came back positive. Patient has received multiple days of IV Lasix , also started on steroids, antibiotics.  Condition finally improving.  Medically stable for discharge.   Principal Problem:   Acute on chronic combined systolic and diastolic congestive heart failure (HCC) Active Problems:   COPD with acute exacerbation (HCC)   Myocardial injury   Paroxysmal atrial fibrillation (HCC)   Type II diabetes mellitus with renal manifestations (HCC)   CKD (chronic kidney disease) stage 4, GFR 15-29 ml/min (HCC)   HTN (hypertension), malignant   Essential hypertension   Fall at home, initial encounter   COVID-19 virus infection   Aspiration pneumonia (HCC)   Hypothermia   Hypothyroidism   Assessment and Plan:  # Bilateral lower lobe aspiration pneumonia. # Dysphagia Patient had an onset of worsening short of breath and chest pain yesterday.  Troponin not significant elevated.  Chest x-ray showed bilateral pleural effusion, with increased bilateral airspace disease. She was placed on Unasyn , but a procalcitonin level not significant elevated.  This may reflect a chemical aspiration.  S/p Augmentin  x 14 days total. She states that she has chronic dysphagia from esophageal problems, seen by speech therapist.  Not currently a good candidate  for EGD.  follow-up with GI as outpatient. Continue combivent  inhalers q6h.  Continue Mucinex  600 mg po bid and Tussionex prn 8/31 still has mild productive cough, overall feels improvement     # Acute on chronic combined systolic and diastolic congestive heart failure (HCC) # Myocardial injury secondary to congestive heart failure. Patient has SOB, 1+ leg edema, significantly elevated BNP> 4500, clinically consistent with CHF exacerbation.  2D echo on 08/09/2023 showed EF 25-30%.  Patient was significant volume overload, s/p IV Lasix .   S/p IV lasix  and Albumin  given on 8/23 Due to concern about renal function, d/c'd Lasix   8/27 looks dry today due to decreased oral intake.  Patient was advised to continue adequate water for hydration otherwise we may have to start IV fluid.     Hypothermia.  Resolved  Hypothyroidism. Patient developed hypothermia last night, was put on heating blanket. Started on Synthroid  for hypothyroidism.   Continue to monitor.   # COPD with acute exacerbation (HCC) # COVID-19 infection: PCR positive on 8/21 Continue Combivent  every 6 hourly, Mucinex  6 mg p.o. twice daily, Tussionex prn S/p low-dose prednisone  10 mg p.o. daily for 10 days.  Steroids given and low-dose due to his significant hyperglycemia.  Completed course.  # HTN (hypertension) -IV hydralazine  as needed -Coreg , oral hydralazine    # Chronic a-fib:  -Eliquis  -Coreg , amodarone No tachycardia.   # CKD (chronic kidney disease) stage 4, GFR 15-29 ml/min (HCC):  Patient renal function has been gradually getting worse, but is starting better today after holding Lasix . S/p IV Lasix .  Monitor renal function.    #  Type II diabetes mellitus with renal manifestations (HCC) Uncontrolled type 2 diabetes with hyperglycemia.: Recent A1c 7.0.  Developed significant hyperglycemia secondary to steroid use.  Prednisone  dose decreased.  No longer has significant hyperglycemia.  Continue sliding scale  insulin  at lower dose.   # Fall at home, initial encounter # Skin tear in the right shin. -consult TOC for HH need or rehab/SNF placement Family wish to bring patient home on Monday. 8/27 patient's condition is declining, family requested for SNF placement.  Today patient was sleepy and lethargic, able to wake up and answer questions, AO x 3. Decreased appetite and decreased oral intake, looks dry.  Advised to encourage for diet and oral water intake.   # Urinary retention, Foley catheter was inserted on 9/1, 550 mL urine was collected.  Continue Foley catheter for 1 week and then follow-up for voiding trial   Palliative care consulted for goals of care discussion with family.     Subjective:  Patient was seen and examined at bedside.  Yesterday evening patient was agitated, received 1 dose of Seroquel  Due to patient was sleepy, dozing off but woke up by calling her name.  She did not eat much, still has low appetite. Patient was reminded about urinary retention and Foley catheter insertion.   Physical Exam: Vitals:   03/23/24 0040 03/23/24 0550 03/23/24 0844 03/23/24 1128  BP: 138/63 (!) 121/51 (!) 104/49 (!) 118/56  Pulse: 62 61 (!) 59 61  Resp: 16 20 16 15   Temp: (!) 97.4 F (36.3 C) 98 F (36.7 C) 98 F (36.7 C) 97.7 F (36.5 C)  TempSrc: Axillary Oral Oral Axillary  SpO2: 97% 96% 96% 97%  Weight:  67.9 kg    Height:       General exam: NAD, looks tired Respiratory system: Equal air entry bilaterally, mild crackles, no wheezes  Cardiovascular system: S1 & S2 heard, RRR. N murmurs. Gastrointestinal system: BS present, NT/ND Central nervous system: No focal deficits, AAOx 3 Extremities: No edema Skin: No rashes, lesions or ulcers Psychiatry: Affect appropriate    Data Reviewed:  Lab results reviewed  Family Communication:  8/28 d/w family at bedside 8/29 no one available at bedside, tried to call patient's daughter, but no answer. 8/30 d/w patient's  daughter over the phone.  Disposition: Status is: Inpatient Remains inpatient appropriate because: Severity of disease 9/1 still very tired,sleepy and low appetite, Foley catheter was inserted farted 50 mL urine was collected. Informed to [facility], plan for discharge tomorrow to SNF.      Time spent: 35 minutes  Author: Elvan Sor, MD 03/23/2024 1:40 PM  For on call review [email or web address].

## 2024-03-24 DIAGNOSIS — R41841 Cognitive communication deficit: Secondary | ICD-10-CM | POA: Diagnosis not present

## 2024-03-24 DIAGNOSIS — R339 Retention of urine, unspecified: Secondary | ICD-10-CM | POA: Diagnosis not present

## 2024-03-24 DIAGNOSIS — I509 Heart failure, unspecified: Secondary | ICD-10-CM | POA: Diagnosis not present

## 2024-03-24 DIAGNOSIS — E039 Hypothyroidism, unspecified: Secondary | ICD-10-CM | POA: Diagnosis not present

## 2024-03-24 DIAGNOSIS — E1122 Type 2 diabetes mellitus with diabetic chronic kidney disease: Secondary | ICD-10-CM | POA: Diagnosis not present

## 2024-03-24 DIAGNOSIS — E440 Moderate protein-calorie malnutrition: Secondary | ICD-10-CM | POA: Diagnosis not present

## 2024-03-24 DIAGNOSIS — L89620 Pressure ulcer of left heel, unstageable: Secondary | ICD-10-CM | POA: Diagnosis not present

## 2024-03-24 DIAGNOSIS — M6281 Muscle weakness (generalized): Secondary | ICD-10-CM | POA: Diagnosis not present

## 2024-03-24 DIAGNOSIS — R2689 Other abnormalities of gait and mobility: Secondary | ICD-10-CM | POA: Diagnosis not present

## 2024-03-24 DIAGNOSIS — I5043 Acute on chronic combined systolic (congestive) and diastolic (congestive) heart failure: Secondary | ICD-10-CM | POA: Diagnosis not present

## 2024-03-24 DIAGNOSIS — J441 Chronic obstructive pulmonary disease with (acute) exacerbation: Secondary | ICD-10-CM | POA: Diagnosis not present

## 2024-03-24 DIAGNOSIS — S90822A Blister (nonthermal), left foot, initial encounter: Secondary | ICD-10-CM | POA: Diagnosis not present

## 2024-03-24 DIAGNOSIS — R1312 Dysphagia, oropharyngeal phase: Secondary | ICD-10-CM | POA: Diagnosis not present

## 2024-03-24 DIAGNOSIS — Z7401 Bed confinement status: Secondary | ICD-10-CM | POA: Diagnosis not present

## 2024-03-24 DIAGNOSIS — R52 Pain, unspecified: Secondary | ICD-10-CM | POA: Diagnosis not present

## 2024-03-24 DIAGNOSIS — D649 Anemia, unspecified: Secondary | ICD-10-CM | POA: Diagnosis not present

## 2024-03-24 DIAGNOSIS — E1129 Type 2 diabetes mellitus with other diabetic kidney complication: Secondary | ICD-10-CM | POA: Diagnosis not present

## 2024-03-24 DIAGNOSIS — E43 Unspecified severe protein-calorie malnutrition: Secondary | ICD-10-CM | POA: Diagnosis not present

## 2024-03-24 DIAGNOSIS — I10 Essential (primary) hypertension: Secondary | ICD-10-CM | POA: Diagnosis not present

## 2024-03-24 DIAGNOSIS — K5900 Constipation, unspecified: Secondary | ICD-10-CM | POA: Diagnosis not present

## 2024-03-24 DIAGNOSIS — N184 Chronic kidney disease, stage 4 (severe): Secondary | ICD-10-CM | POA: Diagnosis not present

## 2024-03-24 DIAGNOSIS — I5040 Unspecified combined systolic (congestive) and diastolic (congestive) heart failure: Secondary | ICD-10-CM | POA: Diagnosis not present

## 2024-03-24 LAB — CBC
HCT: 31.1 % — ABNORMAL LOW (ref 36.0–46.0)
Hemoglobin: 10.1 g/dL — ABNORMAL LOW (ref 12.0–15.0)
MCH: 30.7 pg (ref 26.0–34.0)
MCHC: 32.5 g/dL (ref 30.0–36.0)
MCV: 94.5 fL (ref 80.0–100.0)
Platelets: 179 K/uL (ref 150–400)
RBC: 3.29 MIL/uL — ABNORMAL LOW (ref 3.87–5.11)
RDW: 16.1 % — ABNORMAL HIGH (ref 11.5–15.5)
WBC: 7.7 K/uL (ref 4.0–10.5)
nRBC: 0 % (ref 0.0–0.2)

## 2024-03-24 LAB — BASIC METABOLIC PANEL WITH GFR
Anion gap: 12 (ref 5–15)
BUN: 99 mg/dL — ABNORMAL HIGH (ref 8–23)
CO2: 24 mmol/L (ref 22–32)
Calcium: 8 mg/dL — ABNORMAL LOW (ref 8.9–10.3)
Chloride: 101 mmol/L (ref 98–111)
Creatinine, Ser: 2.39 mg/dL — ABNORMAL HIGH (ref 0.44–1.00)
GFR, Estimated: 19 mL/min — ABNORMAL LOW (ref >60)
Glucose, Bld: 133 mg/dL — ABNORMAL HIGH (ref 70–99)
Potassium: 4 mmol/L (ref 3.5–5.1)
Sodium: 137 mmol/L (ref 135–145)

## 2024-03-24 LAB — GLUCOSE, CAPILLARY
Glucose-Capillary: 125 mg/dL — ABNORMAL HIGH (ref 70–99)
Glucose-Capillary: 161 mg/dL — ABNORMAL HIGH (ref 70–99)

## 2024-03-24 MED ORDER — LEVOTHYROXINE SODIUM 25 MCG PO TABS: 25.0000 ug | ORAL_TABLET | Freq: Every day | ORAL | Status: DC

## 2024-03-24 MED ORDER — GUAIFENESIN-DM 100-10 MG/5ML PO SYRP: 10.0000 mL | ORAL_SOLUTION | Freq: Four times a day (QID) | ORAL | Status: DC | PRN

## 2024-03-24 MED ORDER — MELATONIN 5 MG PO TABS: 5.0000 mg | ORAL_TABLET | Freq: Every evening | ORAL | Status: DC | PRN

## 2024-03-24 MED ORDER — BACITRACIN-POLYMYXIN B OP OINT: 1.0000 | TOPICAL_OINTMENT | Freq: Three times a day (TID) | OPHTHALMIC | Status: AC

## 2024-03-24 MED ORDER — ACETAMINOPHEN 325 MG PO TABS: 650.0000 mg | ORAL_TABLET | Freq: Three times a day (TID) | ORAL | Status: DC | PRN

## 2024-03-24 NOTE — TOC Transition Note (Signed)
 Transition of Care [facility]) - Discharge Note   Patient Details  Name: Leslie Parker MRN: [id number] Date of Birth: 1932/09/23  [facility]) CM/SW Contact:  Dalia GORMAN Fuse, RN Phone Number: 03/24/2024, 3:09 PM   Clinical Narrative:     Patient is medically ready for discharge to [facility]. TOC spoke with the patient's granddaughter and she is agreeable with the dc plan. [facility] will transport. No other TOC needs identified.  Final next level of care: Skilled Nursing Facility Barriers to Discharge: Barriers Resolved   Patient Goals and CMS Choice            Discharge Placement              Patient chooses bed at: [facility]        Discharge Plan and Services Additional resources added to the After Visit Summary for                                       Social Drivers of Health (SDOH) Interventions SDOH Screenings   Food Insecurity: No Food Insecurity (03/12/2024)  Housing: Low Risk  (03/12/2024)  Transportation Needs: No Transportation Needs (03/12/2024)  Utilities: Not At Risk (03/12/2024)  Alcohol Screen: Low Risk  (08/20/2023)  Financial Resource Strain: Low Risk  (01/13/2024)   Received from [facility]  Social Connections: Moderately Isolated (03/12/2024)  Tobacco Use: Low Risk  (03/11/2024)     Readmission Risk Interventions    10/22/2023    2:28 PM 09/16/2023    2:52 PM 08/20/2023    3:45 PM  Readmission Risk Prevention Plan  Transportation Screening Complete Complete Complete  Medication Review Oceanographer) Complete Complete Complete  PCP or Specialist appointment within 3-5 days of discharge  Complete Complete  HRI or Home Care Consult Not Complete Patient refused Complete  HRI or Home Care Consult Pt Refusal Comments No PT recs at this time.    SW Recovery Care/Counseling Consult Complete Complete Complete  Palliative Care Screening Not Applicable Not Applicable Not Applicable  Skilled Nursing Facility  Not Applicable Not Applicable Not Applicable

## 2024-03-24 NOTE — TOC Progression Note (Addendum)
 Transition of Care [facility]) - Progression Note    Patient Details  Name: Leslie Parker MRN: [id number] Date of Birth: [birth year]  Transition of Care [facility]) CM/SW Contact  Dalia GORMAN Fuse, RN Phone Number: 03/24/2024, 12:14 PM  Clinical Narrative:     Approved PlanAuthID:[id number] Dates:9/1-03/25/2024 Next Review Date: 03/25/2024   [facility] spoke with Massie at [facility] to advise the shara is available. Massie will call back and confirm whether they can accept the patient today.  Massie can accept the patient today.       Barriers to Discharge: Insurance Authorization               Expected Discharge Plan and Services         Expected Discharge Date: 03/17/24                                     Social Drivers of Health (SDOH) Interventions SDOH Screenings   Food Insecurity: No Food Insecurity (03/12/2024)  Housing: Low Risk  (03/12/2024)  Transportation Needs: No Transportation Needs (03/12/2024)  Utilities: Not At Risk (03/12/2024)  Alcohol Screen: Low Risk  (08/20/2023)  Financial Resource Strain: Low Risk  (01/13/2024)   Received from [facility]  Social Connections: Moderately Isolated (03/12/2024)  Tobacco Use: Low Risk  (03/11/2024)    Readmission Risk Interventions    10/22/2023    2:28 PM 09/16/2023    2:52 PM 08/20/2023    3:45 PM  Readmission Risk Prevention Plan  Transportation Screening Complete Complete Complete  Medication Review Oceanographer) Complete Complete Complete  PCP or Specialist appointment within 3-5 days of discharge  Complete Complete  HRI or Home Care Consult Not Complete Patient refused Complete  HRI or Home Care Consult Pt Refusal Comments No PT recs at this time.    SW Recovery Care/Counseling Consult Complete Complete Complete  Palliative Care Screening Not Applicable Not Applicable Not Applicable  Skilled Nursing Facility Not Applicable Not Applicable Not Applicable

## 2024-03-24 NOTE — Discharge Summary (Signed)
 [facility] Discharge Summary   Patient: Leslie Parker FMW:[id number]  PCP: Sherial Bail, MD  Date of admission: 03/11/2024   Date of discharge:  03/24/2024     Discharge Diagnoses:  Principal Problem:   Acute on chronic combined systolic and diastolic congestive heart failure (HCC) Active Problems:   COPD with acute exacerbation (HCC)   Myocardial injury   Paroxysmal atrial fibrillation (HCC)   Type II diabetes mellitus with renal manifestations (HCC)   CKD (chronic kidney disease) stage 4, GFR 15-29 ml/min (HCC)   HTN (hypertension), malignant   Essential hypertension   Fall at home, initial encounter   COVID-19 virus infection   Aspiration pneumonia (HCC)   Hypothermia   Hypothyroidism   Admitted From: Home Disposition:  SNF   Recommendations for Outpatient Follow-up:  Follow-up with PCP, need to be seen by an MD in 1 to 2 days.  Repeat chest x-ray after 4 weeks for resolution of pneumonia. Urinary retention, Foley catheter was inserted on 9/1, 1550 mL urine was collected.  Keep Foley for 7 days and then DC and follow voiding trial Started Synthroid  due to hypothyroid, recheck TSH level after 4 to 6 weeks. Follow up LABS/TEST: Check CBC and BMP after 1 week CXR in  4 weeks TSH in 4-6 weeks     Contact information for follow-up providers     [facility] Follow up.   Specialty: Home Health Services Why: They will resume home health services at discharge. Contact information: [address] [place] [address] [phone]         Sherial Bail, MD Follow up in 1 week(s).   Specialty: Internal Medicine Contact information: [address] [place] [address] [phone]         Therisa Bi, MD Follow up in 2 week(s).   Specialty: Gastroenterology Why: The office will reach out to get you schedule for an appointment. Contact information: [address] [place] [address] [phone]               Contact information for after-discharge care     Destination     [facility] .   Service: Skilled Nursing Contact information: [address] [place] [facility]  [phone]                    Diet recommendation: Carb modified diet  Activity: The patient is advised to gradually reintroduce usual activities, as tolerated  Discharge Condition: stable  Code Status: DNR   History of present illness: As per the H and P dictated on admission. Hospital Course:  Leslie Parker is a 89 y.o. female with medical history significant of  sCHF with EF 25-30%, SSS (s/p of PPM), A fib on Eliquis , HTN, HLD, DM, COPD, CKD-IV, osteopenia, ileus, who presents with SOB.  Chest x-ray showed cardiomegaly with vascular congestion, small amount of bilateral pleural effusion.  BNP more than 4000.  She was diagnosed with acute on chronic systolic congestive heart failure, was started on IV Lasix .  She is also placed on steroids for bronchospasm.  However, her COVID test came back positive. Patient has received multiple days of IV Lasix , also started on steroids, antibiotics.  Condition finally improving.  Medically stable for discharge.      Assessment and Plan:   # Bilateral lower lobe aspiration pneumonia. # Dysphagia Patient had an onset of worsening short of breath and chest pain yesterday.  Troponin not significant  elevated.  Chest x-ray showed bilateral pleural effusion, with increased bilateral airspace disease. She was placed on Unasyn , but a procalcitonin level not significant elevated.  This may reflect a chemical aspiration.  S/p Augmentin  x 14 days total. She states that she has chronic dysphagia from esophageal problems, seen by speech therapist.  Not currently a good candidate for EGD.  follow-up with GI as outpatient. S/p Combivent  inhalers q6h.  S/p Mucinex  600 mg po bid and Tussionex prn. Patient is stable now, no need of antibiotics or steroids at this  time. Continue albuterol  as needed, Robitussin DM as needed     # Acute on chronic combined systolic and diastolic CHF # Myocardial injury secondary to congestive heart failure. Patient has SOB, 1+ leg edema, significantly elevated BNP> 4500, clinically consistent with CHF exacerbation.  2D echo on 08/09/2023 showed EF 25-30%.  Patient was significant volume overload, s/p IV Lasix .   S/p IV lasix  and Albumin  given on 8/23 Due to concern about renal function, d/c'd Lasix   Currently patient is euvolemic, looks dry, continue fluid striction 1.5 to 2/day.  Avoid excessive diuresis.  Repeat BMP in 1 week to check renal functions and check BNP as well.     # Hypothermia.  Resolved  # Hypothyroidism. Patient developed hypothermia last night, was put on heating blanket. Started on Synthroid  for hypothyroidism.  Check TSH level in 4 to 6 weeks    # COPD with acute exacerbation due to Covid, resolved now # COVID-19 infection: PCR positive on 8/21 S/p Combivent  every 6 hourly, Mucinex  600 mg p.o. BID and Tussionex prn. S/p low-dose prednisone  10 mg p.o. daily for 10 days.  Steroids given at low-dose due to his significant hyperglycemia.  Completed course.   # HTN (hypertension): Continue Coreg  and oral hydralazine  home dose.  Monitor BP and titrate medications accordingly. # Chronic a-fib: on Eliquis , Coreg , and amodarone.  # s/p PPM and paced rhythm on EKG with QTc prolongation  # CKD (chronic kidney disease) stage 4, GFR 15-29 ml/min (HCC):  Renal functions fluctuating, patient received IV Lasix .  Currently not on diuretics.  Continue fluid restriction 1.52/day, restart diuresis if needed and check BMP after 1 week.  # Type II diabetes mellitus with renal manifestations Uncontrolled type 2 diabetes with hyperglycemia.: Recent A1c 7.0.  Developed significant hyperglycemia secondary to steroid use.  Prednisone  10 mg p.o. daily low-dose was used. S/p insulin  sliding scale.  Continue diabetic diet  and monitor CBG.  Currently patient is not on any medication.  Repeat A1c after 3 months   # Fall at home, initial encounter.  Continue fall precaution # Skin tear in the right shin.  8/27 patient's condition is declining, family requested for SNF placement.   PT and OT consulted, recommended SNF placement. 9/2 today patient is awake and alert, stable to discharge to SNF.   # Urinary retention, Foley catheter was inserted on 9/1, 1550 mL urine was collected.  Continue Foley catheter for 1 week and then follow-up for voiding trial   Body mass index is 21.58 kg/m.  Nutrition Interventions:  Patient was seen by physical therapy, who recommended Therapy, SNF placement, which was arranged. On the day of the discharge the patient's vitals were stable, and no other acute medical condition were reported by patient. the patient was felt safe to be discharge at SNF with Therapy.  Consultants: Palliative care Procedures: None  Discharge Exam: General: Appear in no distress, Oral Mucosa Clear, moist. Cardiovascular: S1 and S2 Present, no  Murmur, Respiratory: normal respiratory effort, Bilateral Air entry present and no Crackles, no wheezes Abdomen: Bowel Sound present, Soft and no tenderness. Extremities: no Pedal edema, no calf tenderness Neurology: alert and oriented to time, place, and person affect appropriate.  Filed Weights   03/22/24 0500 03/23/24 0550 03/24/24 0525  Weight: (!) 149.5 kg 67.9 kg 66.3 kg   Vitals:   03/24/24 1032 03/24/24 1151  BP: 120/60 (!) 128/57  Pulse: 62 60  Resp: 18 17  Temp: 97.6 F (36.4 C) 98 F (36.7 C)  SpO2: 96% 98%    DISCHARGE MEDICATION: Allergies as of 03/24/2024       Reactions   Baclofen Other (See Comments)   Elevated Cr   Simvastatin Other (See Comments)   Pt denies Elevated LFTs with simva 40mg , stopped and resolved (see MD note 11/10/13)        Medication List     STOP taking these medications    B-12 2500 MCG Tabs    losartan  25 MG tablet Commonly known as: Cozaar        TAKE these medications    acetaminophen  325 MG tablet Commonly known as: TYLENOL  Take 2 tablets (650 mg total) by mouth every 8 (eight) hours as needed for fever, headache or mild pain (pain score 1-3). What changed:  medication strength how much to take when to take this reasons to take this   albuterol  108 (90 Base) MCG/ACT inhaler Commonly known as: VENTOLIN  HFA Inhale 2 puffs into the lungs every 6 (six) hours as needed.   alum & mag hydroxide-simeth 200-200-20 MG/5ML suspension Commonly known as: MAALOX/MYLANTA Take 30 mLs by mouth every 6 (six) hours as needed for indigestion or heartburn.   amiodarone  200 MG tablet Commonly known as: PACERONE  Take 1 tablet by mouth daily.   apixaban  2.5 MG Tabs tablet Commonly known as: ELIQUIS  Take 1 tablet (2.5 mg total) by mouth 2 (two) times daily.   ascorbic acid  250 MG tablet Commonly known as: VITAMIN C  Take 250 mg by mouth daily.   bacitracin -polymyxin b  (ophth) Oint Commonly known as: POLYSPORIN  Place 1 Application into both eyes 3 (three) times daily for 2 days.   carvedilol  6.25 MG tablet Commonly known as: COREG  Take 1 tablet (6.25 mg total) by mouth 2 (two) times daily with a meal.   guaiFENesin -dextromethorphan  100-10 MG/5ML syrup Commonly known as: ROBITUSSIN DM Take 10 mLs by mouth every 6 (six) hours as needed for cough.   hydrALAZINE  25 MG tablet Commonly known as: APRESOLINE  25 mg 3 (three) times daily.   levothyroxine  25 MCG tablet Commonly known as: SYNTHROID  Take 1 tablet (25 mcg total) by mouth daily at 6 (six) AM.   melatonin 5 MG Tabs Take 1 tablet (5 mg total) by mouth at bedtime as needed.   multivitamin with minerals Tabs tablet Take 1 tablet by mouth daily. One-A-Day Women's Vitamin   ondansetron  4 MG tablet Commonly known as: ZOFRAN  Take 1 tablet (4 mg total) by mouth every 6 (six) hours as needed for nausea.   pantoprazole   40 MG tablet Commonly known as: PROTONIX  Take 1 tablet (40 mg total) by mouth 2 (two) times daily.   polyethylene glycol 17 g packet Commonly known as: MIRALAX  / GLYCOLAX  Take 17 g by mouth daily as needed.   senna-docusate 8.6-50 MG tablet Commonly known as: Senokot-S Take 2 tablets by mouth at bedtime as needed for mild constipation.   Trelegy Ellipta 100-62.5-25 MCG/ACT Aepb Generic drug: Fluticasone -Umeclidin-Vilant Inhale 1 puff into  the lungs daily.   Vitamin D -1000 Max St 25 MCG (1000 UT) tablet Generic drug: Cholecalciferol  Take 1,000 Units by mouth daily.       Allergies  Allergen Reactions   Baclofen Other (See Comments)    Elevated Cr   Simvastatin Other (See Comments)    Pt denies  Elevated LFTs with simva 40mg , stopped and resolved (see MD note 11/10/13)   Discharge Instructions     Call MD for:  difficulty breathing, headache or visual disturbances   Complete by: As directed    Call MD for:  extreme fatigue   Complete by: As directed    Call MD for:  persistant dizziness or light-headedness   Complete by: As directed    Call MD for:  persistant nausea and vomiting   Complete by: As directed    Call MD for:  redness, tenderness, or signs of infection (pain, swelling, redness, odor or green/yellow discharge around incision site)   Complete by: As directed    Call MD for:  severe uncontrolled pain   Complete by: As directed    Call MD for:  temperature >100.4   Complete by: As directed    Diet Carb Modified   Complete by: As directed    Discharge instructions   Complete by: As directed    Follow-up with PCP, need to be seen by an MD in 1 to 2 days.  Repeat chest x-ray after 4 weeks for resolution of pneumonia. Urinary retention, Foley catheter was inserted on 9/1, 1550 mL urine was collected.  Keep Foley for 7 days and then DC and follow voiding trial Check CBC and BMP after 1 week Started Synthroid  due to hypothyroid, recheck TSH level after 4 to 6  weeks.   Increase activity slowly   Complete by: As directed    No wound care   Complete by: As directed        The results of significant diagnostics from this hospitalization (including imaging, microbiology, ancillary and laboratory) are listed below for reference.    Significant Diagnostic Studies: DG Chest Port 1 View Result Date: 03/14/2024 CLINICAL DATA:  Shortness of breath. EXAM: PORTABLE CHEST 1 VIEW COMPARISON:  03/11/2024. FINDINGS: Stable cardiomegaly. Left-sided dual lead pacemaker is unchanged. Aortic atherosclerosis. Central pulmonary vascular congestion. Persistent small bilateral pleural effusions with slightly increased bibasilar opacities, which could reflect atelectasis and/or infiltrate. No pneumothorax. Visualized osseous structures are unchanged. IMPRESSION: 1. Persistent small bilateral pleural effusions with slightly increased bibasilar opacities, which could reflect atelectasis and/or infiltrate. 2. Cardiomegaly with central pulmonary vascular congestion. Electronically Signed   By: Harrietta Sherry M.D.   On: 03/14/2024 13:21   CT HEAD WO CONTRAST ( ) Result Date: 03/12/2024 CLINICAL DATA:  Minor head trauma. EXAM: CT HEAD WITHOUT CONTRAST TECHNIQUE: Contiguous axial images were obtained from the base of the skull through the vertex without intravenous contrast. RADIATION DOSE REDUCTION: This exam was performed according to the departmental dose-optimization program which includes automated exposure control, adjustment of the mA and/or kV according to patient size and/or use of iterative reconstruction technique. COMPARISON:  Head CT 11/26/2023. 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 There is mild cerebellar and moderately advanced cerebral atrophy, moderate to severe small vessel disease of the cerebral white matter and moderate atrophic ventriculomegaly. An empty sella is again shown. No acute cortical based infarct, hemorrhage, mass or  mass effect is seen. There is no midline shift.  The basal cisterns are clear. Vascular: There are  calcific plaques in the distal vertebral arteries and heavily in the siphons. No hyperdense central vessel is seen. Skull: Negative for fractures or focal lesions. No appreciable scalp hematoma. Sinuses/Orbits: There is increased patchy membrane disease throughout the ethmoid air cells. Other paranasal sinuses, bilateral mastoid air cells, and middle ears are clear. The orbits are only partially visible. Visualized portions unremarkable apart from old lens replacements. Other: None. IMPRESSION: 1. No acute intracranial CT findings or depressed skull fractures. 2. Chronic changes.  Stable exam intracranially. 3. Increased ethmoid sinus disease. Electronically Signed   By: Francis Quam M.D.   On: 03/12/2024 02:49   US  Venous Img Lower Unilateral Right Result Date: 03/12/2024 CLINICAL DATA:  Right lower extremity swelling EXAM: RIGHT LOWER EXTREMITY VENOUS DOPPLER ULTRASOUND TECHNIQUE: Gray-scale sonography with compression, as well as color and duplex ultrasound, were performed to evaluate the deep venous system(s) from the level of the common femoral vein through the popliteal and proximal calf veins. COMPARISON:  None Available. FINDINGS: VENOUS Normal compressibility of the common femoral, superficial femoral, and popliteal veins, as well as the visualized calf veins. Visualized portions of profunda femoral vein and great saphenous vein unremarkable. No filling defects to suggest DVT on grayscale or color Doppler imaging. Doppler waveforms show normal direction of venous flow, normal respiratory plasticity and response to augmentation. Limited views of the contralateral common femoral vein are unremarkable. OTHER None. Limitations: Right peroneal vein not visualized. IMPRESSION: No evidence of right lower extremity DVT. Electronically Signed   By: Franky Crease M.D.   On: 03/12/2024 00:52   DG Chest Portable 1  View Result Date: 03/11/2024 CLINICAL DATA:  Shortness of breath and cough for 1 week with audible wheezing. EXAM: PORTABLE CHEST 1 VIEW COMPARISON:  Portable chest 02/25/2024 FINDINGS: 11:15 p.m. Left chest dual lead pacing system and wire insertions are unaltered. Cardiomegaly is stable. There is mild central vascular prominence but no overt edema. The aorta is tortuous and calcified. The mediastinum is stable. Small pleural effusions are again shown. There are opacities overlying both pleural effusions which could be due to atelectasis or consolidation, greater on the right. The more cephalad lungs are clear. There is osteopenia with degenerative change of spine, right shoulder replacement and left shoulder postsurgical changes. Similar findings were present 15 days ago. The overall aeration seems unchanged. IMPRESSION: 1. No significant change from 15 days ago. 2. Stable cardiomegaly and mild central vascular prominence. 3. Small pleural effusions with overlying opacities which could be due to atelectasis or consolidation, greater on the right. Electronically Signed   By: Francis Quam M.D.   On: 03/11/2024 23:29   MR LUMBAR SPINE WO CONTRAST Result Date: 03/09/2024 CLINICAL DATA:  Spinal stenosis EXAM: MRI LUMBAR SPINE WITHOUT CONTRAST TECHNIQUE: Multiplanar, multisequence MR imaging of the lumbar spine was performed. No intravenous contrast was administered. COMPARISON:  None Available. FINDINGS: Segmentation: Standard. Alignment:  Dextrocurvature of the lumbar spine. Vertebrae: Areas of T1/T2 hyperintensity in lumbar vertebrae most consistent with intraosseous hemangioma or focal fat. Mild chronic compression deformity at T12. Conus medullaris and cauda equina: The conus medullaris terminates at the level of L1-L2. The distal spinal cord signal intensity is normal. Paraspinal and other soft tissues: Renal cysts bilaterally. The visualized aorta is normal. Disc levels: L1-L2: Disc bulge. Moderate  bilateral facet arthropathy. Moderate right neuroforaminal stenosis. Mild spinal canal stenosis. L2-L3: Disc bulge. Moderate bilateral facet arthropathy. Moderate bilateral neuroforaminal stenosis. Moderate spinal canal stenosis. L3-L4: Disc bulge. Severe right facet arthropathy. Severe left and  mild right neuroforaminal stenosis. Moderate spinal canal stenosis. L4-L5: Disc bulge. Severe bilateral facet arthropathy. Severe left and mild right neuroforaminal stenosis. Moderate spinal canal stenosis. L5-S1: Disc bulge. Severe facet arthropathy. No neuroforaminal stenosis. No spinal canal stenosis. IMPRESSION: 1. Moderate spinal canal stenosis at L2-L3, L3-L4, and L4-L5 secondary to disc bulging and facet arthropathy 2. Severe left neuroforaminal stenosis at L3-L4 and L4-L5. 3. Moderate neuroforaminal stenosis on the right at L1-L2 and bilaterally at L2-L3. Electronically Signed   By: Clem Savory M.D.   On: 03/09/2024 15:32   DG Abd 1 View Result Date: 02/28/2024 CLINICAL DATA:  Abdominal pain EXAM: ABDOMEN - 1 VIEW COMPARISON:  11/27/2023 FINDINGS: Scattered large and small bowel gas is noted. Bilateral hip surgery is noted. Degenerative changes of the lumbar spine are seen with stable scoliosis concave to the right. No free air seen. No acute bony abnormality is noted. IMPRESSION: No acute abnormality seen. Electronically Signed   By: Oneil Devonshire M.D.   On: 02/28/2024 21:56   US  RENAL Result Date: 02/28/2024 EXAM: RETROPERITONEAL ULTRASOUND OF THE KIDNEYS AND URINARY BLADDER TECHNIQUE: Real-time ultrasonography of the retroperitoneum including the kidneys and urinary bladder was performed. COMPARISON: 06/28/2024 CT CLINICAL HISTORY: Acute kidney injury [facility]) FINDINGS: RIGHT KIDNEY: The right kidney measures 11.8 x 7.6 x 8 cm, 378 cc. Multiple right renal cortical cysts measuring up to 8.1 cm. No hydronephrosis. LEFT KIDNEY: The left kidney measures 12.6 x 6.3 x 6.6 cm, 271 cc. Exophytic left renal cortical cyst  5 cm. No hydronephrosis. BLADDER: Unremarkable appearance of the bladder. SPLEEN: 2 cm splenic cyst. IMPRESSION: 1. No hydronephrosis. 2. Multiple  renal   cysts. 3. 2 cm splenic cyst. Electronically signed by: Katheleen Faes MD 02/28/2024 04:12 PM EDT RP Workstation: [id number]   DG Chest Port 1 View Result Date: 02/25/2024 EXAM: 1 VIEW XRAY OF THE CHEST 02/25/2024 01:48:50 AM COMPARISON: None available. CLINICAL HISTORY: [id number] Chest pain [id number]. PER ER NOTE; Pt arrived via [facility] from home with c/o sudden onset of left sided chest pain accompanied by SOB. Pt reports pain as sharp, dull, aching pain. 1.5 Inch nitroglycerin  paste applied by EMS as well as 324mg  Aspirin  in route to ED. ; ; **Pt has a pacemaker FINDINGS: LUNGS AND PLEURA: Small bilateral pleural effusions with basilar atelectasis. Pulmonary vascular congestion. HEART AND MEDIASTINUM: Mild cardiomegaly with calcific aortic atherosclerosis. BONES AND SOFT TISSUES: Unchanged position of left chest wall pacemaker. IMPRESSION: 1. Mild cardiomegaly with calcific aortic atherosclerosis and pulmonary vascular congestion. 2. Small bilateral pleural effusions with basilar atelectasis. Electronically signed by: Franky Stanford MD 02/25/2024 02:29 AM EDT RP Workstation: [id number]    Microbiology: No results found for this or any previous visit (from the past 240 hours).   Labs: CBC: Recent Labs  Lab 03/20/24 0525 03/21/24 0519 03/22/24 0352 03/23/24 0332 03/24/24 0428  WBC 6.3 6.5 6.8 8.4 7.7  HGB 11.4* 11.2* 11.7* 11.5* 10.1*  HCT 34.3* 33.7* 35.5* 35.2* 31.1*  MCV 93.2 92.3 93.7 93.1 94.5  PLT 216 226 222 215 179   Basic Metabolic Panel: Recent Labs  Lab 03/18/24 0442 03/19/24 0957 03/20/24 0525 03/21/24 0519 03/22/24 0352 03/23/24 0332 03/24/24 0428  NA 138 136 136 135 137 134* 137  K 4.1 3.7 3.8 3.4* 3.6 3.9 4.0  CL 97* 99 99 100 102 99 101  CO2 27 25 24 24 24 25 24   GLUCOSE 104* 153* 103* 99 117* 109* 133*  BUN 98* 95* 98*  92* 92* 94* 99*  CREATININE 2.82* 2.84* 2.67* 2.33* 2.44* 2.36* 2.39*  CALCIUM 8.5* 8.2* 8.1* 8.2* 8.3* 8.2* 8.0*  MG 2.2 2.1 2.3 2.3 2.3  --   --   PHOS 4.6 3.2 3.2 2.9 2.8  --   --    Liver Function Tests: No results for input(s): AST, ALT, ALKPHOS, BILITOT, PROT, ALBUMIN  in the last 168 hours. No results for input(s): LIPASE, AMYLASE in the last 168 hours. No results for input(s): AMMONIA in the last 168 hours. Cardiac Enzymes: No results for input(s): CKTOTAL, CKMB, CKMBINDEX, TROPONINI in the last 168 hours. BNP (last 3 results) Recent Labs    02/25/24 0117 03/05/24 1610 03/11/24 2304  BNP >4,500.0* >4,500.0* >4,500.0*   CBG: Recent Labs  Lab 03/23/24 1135 03/23/24 1717 03/23/24 2054 03/24/24 0753 03/24/24 1154  GLUCAP 104* 148* 167* 125* 161*    Time spent: 35 minutes  Signed:  Elvan Sor  [facility] 03/24/2024 1:56 PM

## 2024-03-24 NOTE — Plan of Care (Signed)

## 2024-03-25 DIAGNOSIS — E1122 Type 2 diabetes mellitus with diabetic chronic kidney disease: Secondary | ICD-10-CM | POA: Diagnosis not present

## 2024-03-25 DIAGNOSIS — E039 Hypothyroidism, unspecified: Secondary | ICD-10-CM | POA: Diagnosis not present

## 2024-03-25 DIAGNOSIS — R52 Pain, unspecified: Secondary | ICD-10-CM | POA: Diagnosis not present

## 2024-03-25 DIAGNOSIS — I4891 Unspecified atrial fibrillation: Secondary | ICD-10-CM | POA: Diagnosis not present

## 2024-03-25 DIAGNOSIS — I10 Essential (primary) hypertension: Secondary | ICD-10-CM | POA: Diagnosis not present

## 2024-03-25 DIAGNOSIS — G4700 Insomnia, unspecified: Secondary | ICD-10-CM | POA: Diagnosis not present

## 2024-03-25 DIAGNOSIS — K5900 Constipation, unspecified: Secondary | ICD-10-CM | POA: Diagnosis not present

## 2024-03-25 DIAGNOSIS — R41841 Cognitive communication deficit: Secondary | ICD-10-CM | POA: Diagnosis not present

## 2024-03-25 DIAGNOSIS — D649 Anemia, unspecified: Secondary | ICD-10-CM | POA: Diagnosis not present

## 2024-03-25 DIAGNOSIS — R12 Heartburn: Secondary | ICD-10-CM | POA: Diagnosis not present

## 2024-03-25 DIAGNOSIS — E43 Unspecified severe protein-calorie malnutrition: Secondary | ICD-10-CM | POA: Diagnosis not present

## 2024-03-25 DIAGNOSIS — R059 Cough, unspecified: Secondary | ICD-10-CM | POA: Diagnosis not present

## 2024-03-25 DIAGNOSIS — R112 Nausea with vomiting, unspecified: Secondary | ICD-10-CM | POA: Diagnosis not present

## 2024-03-25 DIAGNOSIS — J441 Chronic obstructive pulmonary disease with (acute) exacerbation: Secondary | ICD-10-CM | POA: Diagnosis not present

## 2024-03-25 DIAGNOSIS — I5043 Acute on chronic combined systolic (congestive) and diastolic (congestive) heart failure: Secondary | ICD-10-CM | POA: Diagnosis not present

## 2024-03-25 DIAGNOSIS — R0602 Shortness of breath: Secondary | ICD-10-CM | POA: Diagnosis not present

## 2024-03-25 DIAGNOSIS — R1312 Dysphagia, oropharyngeal phase: Secondary | ICD-10-CM | POA: Diagnosis not present

## 2024-03-27 DIAGNOSIS — I10 Essential (primary) hypertension: Secondary | ICD-10-CM | POA: Diagnosis not present

## 2024-03-27 DIAGNOSIS — I5043 Acute on chronic combined systolic (congestive) and diastolic (congestive) heart failure: Secondary | ICD-10-CM | POA: Diagnosis not present

## 2024-03-27 DIAGNOSIS — R1312 Dysphagia, oropharyngeal phase: Secondary | ICD-10-CM | POA: Diagnosis not present

## 2024-03-27 DIAGNOSIS — K5900 Constipation, unspecified: Secondary | ICD-10-CM | POA: Diagnosis not present

## 2024-03-27 DIAGNOSIS — E039 Hypothyroidism, unspecified: Secondary | ICD-10-CM | POA: Diagnosis not present

## 2024-03-27 DIAGNOSIS — I4891 Unspecified atrial fibrillation: Secondary | ICD-10-CM | POA: Diagnosis not present

## 2024-03-27 DIAGNOSIS — J441 Chronic obstructive pulmonary disease with (acute) exacerbation: Secondary | ICD-10-CM | POA: Diagnosis not present

## 2024-03-27 DIAGNOSIS — R41841 Cognitive communication deficit: Secondary | ICD-10-CM | POA: Diagnosis not present

## 2024-03-30 DIAGNOSIS — R1312 Dysphagia, oropharyngeal phase: Secondary | ICD-10-CM | POA: Diagnosis not present

## 2024-03-30 DIAGNOSIS — J441 Chronic obstructive pulmonary disease with (acute) exacerbation: Secondary | ICD-10-CM | POA: Diagnosis not present

## 2024-03-30 DIAGNOSIS — R110 Nausea: Secondary | ICD-10-CM | POA: Diagnosis not present

## 2024-03-30 DIAGNOSIS — E039 Hypothyroidism, unspecified: Secondary | ICD-10-CM | POA: Diagnosis not present

## 2024-03-30 DIAGNOSIS — J189 Pneumonia, unspecified organism: Secondary | ICD-10-CM | POA: Diagnosis not present

## 2024-03-30 DIAGNOSIS — R339 Retention of urine, unspecified: Secondary | ICD-10-CM | POA: Diagnosis not present

## 2024-03-30 DIAGNOSIS — R059 Cough, unspecified: Secondary | ICD-10-CM | POA: Diagnosis not present

## 2024-03-30 DIAGNOSIS — R41841 Cognitive communication deficit: Secondary | ICD-10-CM | POA: Diagnosis not present

## 2024-03-30 DIAGNOSIS — N184 Chronic kidney disease, stage 4 (severe): Secondary | ICD-10-CM | POA: Diagnosis not present

## 2024-03-30 DIAGNOSIS — I509 Heart failure, unspecified: Secondary | ICD-10-CM | POA: Diagnosis not present

## 2024-03-30 DIAGNOSIS — E43 Unspecified severe protein-calorie malnutrition: Secondary | ICD-10-CM | POA: Diagnosis not present

## 2024-03-30 DIAGNOSIS — I5043 Acute on chronic combined systolic (congestive) and diastolic (congestive) heart failure: Secondary | ICD-10-CM | POA: Diagnosis not present

## 2024-03-30 DIAGNOSIS — D649 Anemia, unspecified: Secondary | ICD-10-CM | POA: Diagnosis not present

## 2024-03-30 DIAGNOSIS — E1122 Type 2 diabetes mellitus with diabetic chronic kidney disease: Secondary | ICD-10-CM | POA: Diagnosis not present

## 2024-03-30 DIAGNOSIS — K5900 Constipation, unspecified: Secondary | ICD-10-CM | POA: Diagnosis not present

## 2024-03-31 DIAGNOSIS — I509 Heart failure, unspecified: Secondary | ICD-10-CM | POA: Diagnosis not present

## 2024-03-31 DIAGNOSIS — K5900 Constipation, unspecified: Secondary | ICD-10-CM | POA: Diagnosis not present

## 2024-03-31 DIAGNOSIS — N184 Chronic kidney disease, stage 4 (severe): Secondary | ICD-10-CM | POA: Diagnosis not present

## 2024-03-31 DIAGNOSIS — E440 Moderate protein-calorie malnutrition: Secondary | ICD-10-CM | POA: Diagnosis not present

## 2024-04-01 DIAGNOSIS — I4891 Unspecified atrial fibrillation: Secondary | ICD-10-CM | POA: Diagnosis not present

## 2024-04-01 DIAGNOSIS — N184 Chronic kidney disease, stage 4 (severe): Secondary | ICD-10-CM | POA: Diagnosis not present

## 2024-04-01 DIAGNOSIS — R339 Retention of urine, unspecified: Secondary | ICD-10-CM | POA: Diagnosis not present

## 2024-04-01 DIAGNOSIS — I10 Essential (primary) hypertension: Secondary | ICD-10-CM | POA: Diagnosis not present

## 2024-04-01 DIAGNOSIS — I5043 Acute on chronic combined systolic (congestive) and diastolic (congestive) heart failure: Secondary | ICD-10-CM | POA: Diagnosis not present

## 2024-04-01 DIAGNOSIS — J441 Chronic obstructive pulmonary disease with (acute) exacerbation: Secondary | ICD-10-CM | POA: Diagnosis not present

## 2024-04-01 DIAGNOSIS — R41841 Cognitive communication deficit: Secondary | ICD-10-CM | POA: Diagnosis not present

## 2024-04-01 DIAGNOSIS — I509 Heart failure, unspecified: Secondary | ICD-10-CM | POA: Diagnosis not present

## 2024-04-01 DIAGNOSIS — E440 Moderate protein-calorie malnutrition: Secondary | ICD-10-CM | POA: Diagnosis not present

## 2024-04-01 DIAGNOSIS — J189 Pneumonia, unspecified organism: Secondary | ICD-10-CM | POA: Diagnosis not present

## 2024-04-01 DIAGNOSIS — R1312 Dysphagia, oropharyngeal phase: Secondary | ICD-10-CM | POA: Diagnosis not present

## 2024-04-02 DIAGNOSIS — N184 Chronic kidney disease, stage 4 (severe): Secondary | ICD-10-CM | POA: Diagnosis not present

## 2024-04-02 DIAGNOSIS — I10 Essential (primary) hypertension: Secondary | ICD-10-CM | POA: Diagnosis not present

## 2024-04-02 DIAGNOSIS — R339 Retention of urine, unspecified: Secondary | ICD-10-CM | POA: Diagnosis not present

## 2024-04-02 DIAGNOSIS — I509 Heart failure, unspecified: Secondary | ICD-10-CM | POA: Diagnosis not present

## 2024-04-05 DIAGNOSIS — R52 Pain, unspecified: Secondary | ICD-10-CM | POA: Diagnosis not present

## 2024-04-05 DIAGNOSIS — S90822A Blister (nonthermal), left foot, initial encounter: Secondary | ICD-10-CM | POA: Diagnosis not present

## 2024-04-05 DIAGNOSIS — L89620 Pressure ulcer of left heel, unstageable: Secondary | ICD-10-CM | POA: Diagnosis not present

## 2024-04-05 DIAGNOSIS — E440 Moderate protein-calorie malnutrition: Secondary | ICD-10-CM | POA: Diagnosis not present

## 2024-04-06 DIAGNOSIS — E43 Unspecified severe protein-calorie malnutrition: Secondary | ICD-10-CM | POA: Diagnosis not present

## 2024-04-06 DIAGNOSIS — D649 Anemia, unspecified: Secondary | ICD-10-CM | POA: Diagnosis not present

## 2024-04-06 DIAGNOSIS — E1122 Type 2 diabetes mellitus with diabetic chronic kidney disease: Secondary | ICD-10-CM | POA: Diagnosis not present

## 2024-04-06 DIAGNOSIS — E039 Hypothyroidism, unspecified: Secondary | ICD-10-CM | POA: Diagnosis not present

## 2024-04-08 ENCOUNTER — Telehealth: Payer: Self-pay | Admitting: Family

## 2024-04-08 DIAGNOSIS — I509 Heart failure, unspecified: Secondary | ICD-10-CM | POA: Diagnosis not present

## 2024-04-08 DIAGNOSIS — J441 Chronic obstructive pulmonary disease with (acute) exacerbation: Secondary | ICD-10-CM | POA: Diagnosis not present

## 2024-04-08 DIAGNOSIS — I5043 Acute on chronic combined systolic (congestive) and diastolic (congestive) heart failure: Secondary | ICD-10-CM | POA: Diagnosis not present

## 2024-04-08 DIAGNOSIS — R41841 Cognitive communication deficit: Secondary | ICD-10-CM | POA: Diagnosis not present

## 2024-04-08 DIAGNOSIS — N184 Chronic kidney disease, stage 4 (severe): Secondary | ICD-10-CM | POA: Diagnosis not present

## 2024-04-08 DIAGNOSIS — K5900 Constipation, unspecified: Secondary | ICD-10-CM | POA: Diagnosis not present

## 2024-04-08 DIAGNOSIS — R1312 Dysphagia, oropharyngeal phase: Secondary | ICD-10-CM | POA: Diagnosis not present

## 2024-04-08 NOTE — Progress Notes (Deleted)
 [facility] Note    PCP: Sherial Bail, MD  Cardiologist: Florencio Kava, MD (last seen 07/25; returns 01/26)  Chief Complaint: shortness of breath   HPI:  Leslie Parker is a 89 y/o female with a history of COPD stage IV, PAF, HTN, DM, CKD, hypomagnesemia/ hypokalemia, hyponatremia, pacemaker due to SSS (04/24), asthma & chronic heart failure.   Echo 06/06/22: EF 60-65% with mild LVH, mild LAE, mild MR, mild/ moderate TR, mild Leslie Echo 02/13/23: EF 60-65% with moderate LVH, Grade I DD, mild MR  Admitted 03/20/23 due to shortness of breath, weakness and pedal edema. Chest x-ray done today shows retrocardiac strandy opacities like atelectasis. IV lasix  given. Completed course of antibiotics for pneumonia. CXR showed multifocal nodular opacity in the left lower lobe, possibly endobronchial. The largest nodule measures 11 mm. 55-month follow-up recommended to ensure resolution.   Admitted 04/30/23 due to shortness of breath and found to by hypoxic. Started on IV solumedrol and given IV magnesium  for Mg of 1.2. Lasix  had to be held due to rising creatinine. IVF given for continuing rise in creatinine. Renal ultrasound showing multiple bilateral renal cysts. Every other day furosemide  resumed.   Admitted 06/04/23 due to increased work of breathing over the past 2 to 3 days. Positive orthopnea and PND. Placed on bipap. BNP 1530. Cardiology consulted. IV lasix  given. Weaned off bipap and on to 3L oxygen & then weaned down to 1L  Admitted 06/16/23 due to worsening shortness of breath. Found to have elevated BNP. IV diuresed. Did not meet criteria for home oxygen after walk test completed. Metoprolol  stopped & carvedilol  started. Elevated troponin thought to be due to demand ischemia.  Echo 08/09/23: EF 25-30%, mild LVH, normal RV, moderate LAE, moderate MR  Seen last [facility] OV 12/24, patient has been admitted >12 times with majority of admissions being respiratory failure/ HF  exacerbation.   Admitted 01/20/24 with shortness of breath along with orthopnea. EMS found her hypoxic in low 80s. She previously declined hospice. On presentation's vitals stable on 2 L of oxygen, labs with increasing creatinine to 2.24 with baseline seems to be around 1.9-2.0, hemoglobin of 10.8 which is around her baseline, BNP more than 4500 which she had it for the past many months, troponin 36, VBG with CO2 of 40 otherwise normal. Chest x-ray with cardiomegaly and vascular congestion. Echo 01/21/24: EF 25-30%, mild LVH, G2DD, normal RV, mild MR. IV diuresed. Palliative consulted again. IV diuretic transitioned to oral diuretics. Remains on oxygen.   Admitted 02/25/24 with nonradiating chest pain while in bed the night prior. In the ED she was hemodynamically stable on 2 L nasal cannula, BNP chronically elevated above 4500, troponin slightly elevated but flat which appears consistent with baseline. CXR revealed bilateral vascular congestion and effusions. IV diuresed. Nephrology consulted due to worsening renal function. Developed sudden abdominal pain and bloody diarrhea. Hg stable. Declined GI consults/ colonoscopy. Tropinins elevated and flat. Weaned to room air. Not a dialysis candidate due to age/ comorbidities. Palliative care consulted.   She presents today, with her daughter, for a post-hospital visit with a chief complaint of shortness of breath. Has associated fatigue, weakness, pedal edema. Appetite good. Some medications have been held due to renal function.   ROS: All systems negative except as listed in HPI, PMH and Problem List.  SH:  Social History   Socioeconomic History   Marital status: Divorced    Spouse name: Not on file   Number of children: 4  Years of education: Not on file   Highest education level: 12th grade  Occupational History   Occupation: Retitred/Disability  Tobacco Use   Smoking status: Never   Smokeless tobacco: Never  Vaping Use   Vaping status: Never  Used  Substance and Sexual Activity   Alcohol use: No    Alcohol/week: 0.0 standard drinks of alcohol   Drug use: No   Sexual activity: Not Currently  Other Topics Concern   Not on file  Social History Narrative   Not on file   Social Drivers of Health   Financial Resource Strain: Low Risk  (01/13/2024)   Received from [facility]   Overall Financial Resource Strain (CARDIA)    Difficulty of Paying Living Expenses: Not hard at all  Food Insecurity: No Food Insecurity (03/12/2024)   Hunger Vital Sign    Worried About Running Out of Food in the Last Year: Never true    Ran Out of Food in the Last Year: Never true  Transportation Needs: No Transportation Needs (03/12/2024)   PRAPARE - Administrator, Civil Service (Medical): No    Lack of Transportation (Non-Medical): No  Physical Activity: Not on file  Stress: Not on file  Social Connections: Moderately Isolated (03/12/2024)   Social Connection and Isolation Panel    Frequency of Communication with Friends and Family: More than three times a week    Frequency of Social Gatherings with Friends and Family: Three times a week    Attends Religious Services: 1 to 4 times per year    Active Member of Clubs or Organizations: No    Attends Banker Meetings: Never    Marital Status: Divorced  Catering manager Violence: Not At Risk (03/12/2024)   Humiliation, Afraid, Rape, and Kick questionnaire    Fear of Current or Ex-Partner: No    Emotionally Abused: No    Physically Abused: No    Sexually Abused: No    FH:  Family History  Problem Relation Age of Onset   Heart failure Mother    Hypertension Mother    Emphysema Father     Past Medical History:  Diagnosis Date   Acute kidney injury superimposed on CKD (HCC) 11/24/2022   Asthma    Atrial fibrillation with RVR (HCC)    Cancer (HCC)    Basal Cell   CHF (congestive heart failure) (HCC)    Closed left hip fracture (HCC) 01/25/2017    Closed right hip fracture (HCC) 02/13/2023   Diabetes mellitus without complication (HCC)    Femur fracture, left (HCC) 02/03/2015   Heart murmur    Hypertension    Impetigo    Osteoarthritis    Osteopenia    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   Rapid atrial fibrillation, new onset(HCC) 06/05/2022   Vomiting    can not due to surgery   Wears dentures    full upper and lower    Current Outpatient Medications  Medication Sig Dispense Refill   acetaminophen  (TYLENOL ) 325 MG tablet Take 2 tablets (650 mg total) by mouth every 8 (eight) hours as needed for fever, headache or mild pain (pain score 1-3).     albuterol  (VENTOLIN  HFA) 108 (90 Base) MCG/ACT inhaler Inhale 2 puffs into the lungs every 6 (six) hours as needed.     alum & mag hydroxide-simeth (MAALOX/MYLANTA) 200-200-20 MG/5ML suspension Take 30 mLs by mouth every 6 (six) hours as needed for indigestion or heartburn.     amiodarone  (PACERONE )  200 MG tablet Take 1 tablet by mouth daily.     apixaban  (ELIQUIS ) 2.5 MG TABS tablet Take 1 tablet (2.5 mg total) by mouth 2 (two) times daily. 60 tablet 1   ascorbic acid  (VITAMIN C ) 250 MG tablet Take 250 mg by mouth daily.     carvedilol  (COREG ) 6.25 MG tablet Take 1 tablet (6.25 mg total) by mouth 2 (two) times daily with a meal. 60 tablet 0   Cholecalciferol  (VITAMIN D -1000 MAX ST) 25 MCG (1000 UT) tablet Take 1,000 Units by mouth daily.     guaiFENesin -dextromethorphan  (ROBITUSSIN DM) 100-10 MG/5ML syrup Take 10 mLs by mouth every 6 (six) hours as needed for cough.     hydrALAZINE  (APRESOLINE ) 25 MG tablet 25 mg 3 (three) times daily.     levothyroxine  (SYNTHROID ) 25 MCG tablet Take 1 tablet (25 mcg total) by mouth daily at 6 (six) AM.     melatonin 5 MG TABS Take 1 tablet (5 mg total) by mouth at bedtime as needed.     Multiple Vitamin (MULTIVITAMIN WITH MINERALS) TABS tablet Take 1 tablet by mouth daily. One-A-Day Women's Vitamin     ondansetron  (ZOFRAN ) 4 MG tablet Take 1 tablet  (4 mg total) by mouth every 6 (six) hours as needed for nausea. 20 tablet 0   pantoprazole  (PROTONIX ) 40 MG tablet Take 1 tablet (40 mg total) by mouth 2 (two) times daily. 60 tablet 0   polyethylene glycol (MIRALAX  / GLYCOLAX ) 17 g packet Take 17 g by mouth daily as needed.     senna-docusate (SENOKOT-S) 8.6-50 MG tablet Take 2 tablets by mouth at bedtime as needed for mild constipation.     TRELEGY ELLIPTA 100-62.5-25 MCG/ACT AEPB Inhale 1 puff into the lungs daily.     No current facility-administered medications for this visit.   There were no vitals filed for this visit.  Wt Readings from Last 3 Encounters:  03/24/24 146 lb 1.6 oz (66.3 kg)  03/05/24 154 lb (69.9 kg)  03/02/24 152 lb 1.9 oz (69 kg)   Lab Results  Component Value Date   CREATININE 2.39 (H) 03/24/2024   CREATININE 2.36 (H) 03/23/2024   CREATININE 2.44 (H) 03/22/2024    PHYSICAL EXAM:  General: Frail, elderly female in wheelchair Cor: No JVD. Irregular rhythm, normal rate.  Lungs: clear Abdomen: soft, nontender, nondistended. Extremities: 1+ pitting edema bilateral mid shins Neuro:. Affect pleasant   ECG: not done   ASSESSMENT & PLAN:  1: NICM with preserved ejection fraction- - suspect due to AF/ COPD/ ? OSA - NYHA Parker III - euvolemic today - weight down 4 pounds from last visit here 3 weeks ago - Echo 06/06/22: EF 60-65% with mild LVH, mild LAE, mild MR, mild/ moderate TR, mild Leslie - Echo 02/13/23: EF 60-65% with moderate LVH, Grade I DD, mild MR - Echo 08/09/23: EF 25-30%, mild LVH, normal RV, moderate LAE, moderate MR - Echo 01/21/24: EF 25-30%, mild LVH, G2DD, normal RV, mild MR. IV diuresed. - continue carvedilol  6.25mg  BID - torsemide  currently held due to renal function - Jardiance  stopped due to renal function - losartan  currently paused due to renal function - renal function will not allow for MRA - wear compression socks daily - BNP 02/25/24 was >4500.0; BNP today - Lengthy discussion  regarding her health, multiple admissions and worsening renal function. Discussed the different in palliative care vs hospice and she says that she's currently not interested in hospice. She is agreeable to palliative care so that  referral was placed today  2: HTN- - BP 147/70 - saw PCP Deretha) 06/25 - continue carvedilol  6.25mg  BID  3: Atrial fibrillation- - saw cardiology Philippe) 07/25; returns 01/26 - continue amiodarone  200mg  daily - continue apixaban  2.5mg  BID - continue carvedilol  6.25mg  BID  4: DM- - A1c 01/29/24 was 7.0%  5: COPD- - saw pulmonology Alica) 07/24; returns 09/25 - does not wear oxygen  6: CKD- - BMP 03/02/24 reviewed: sodium 136, potassium 5.0, creatinine 3.18 & GFR 13 - sees nephrology Jil) 08/25 - BMET today   Return in 1 month, sooner if needed.   Leslie Parker 04/08/24

## 2024-04-08 NOTE — Telephone Encounter (Signed)
 Called to confirm/remind patient of their appointment at the [facility] on 04/09/24.   Appointment:   [] Confirmed  [x] Left mess   [] No answer/No voice mail  [] VM Full/unable to leave message  [] Phone not in service  Patient reminded to bring all medications and/or complete list.  Confirmed patient has transportation. Gave directions, instructed to utilize valet parking.

## 2024-04-09 ENCOUNTER — Encounter: Admitting: Family

## 2024-04-10 DIAGNOSIS — I5043 Acute on chronic combined systolic (congestive) and diastolic (congestive) heart failure: Secondary | ICD-10-CM | POA: Diagnosis not present

## 2024-04-10 DIAGNOSIS — R41841 Cognitive communication deficit: Secondary | ICD-10-CM | POA: Diagnosis not present

## 2024-04-10 DIAGNOSIS — J441 Chronic obstructive pulmonary disease with (acute) exacerbation: Secondary | ICD-10-CM | POA: Diagnosis not present

## 2024-04-10 DIAGNOSIS — R1312 Dysphagia, oropharyngeal phase: Secondary | ICD-10-CM | POA: Diagnosis not present

## 2024-04-13 ENCOUNTER — Inpatient Hospital Stay
Admission: EM | Admit: 2024-04-13 | Discharge: 2024-04-16 | DRG: 092 | Disposition: A | Discharge status: Skilled Nursing Facility | Attending: Hospitalist | Admitting: Hospitalist

## 2024-04-13 ENCOUNTER — Emergency Department

## 2024-04-13 ENCOUNTER — Inpatient Hospital Stay

## 2024-04-13 ENCOUNTER — Encounter: Payer: Self-pay | Admitting: Emergency Medicine

## 2024-04-13 ENCOUNTER — Other Ambulatory Visit: Payer: Self-pay

## 2024-04-13 DIAGNOSIS — E0781 Sick-euthyroid syndrome: Secondary | ICD-10-CM | POA: Diagnosis present

## 2024-04-13 DIAGNOSIS — R339 Retention of urine, unspecified: Secondary | ICD-10-CM | POA: Diagnosis present

## 2024-04-13 DIAGNOSIS — R531 Weakness: Secondary | ICD-10-CM | POA: Diagnosis not present

## 2024-04-13 DIAGNOSIS — Z96611 Presence of right artificial shoulder joint: Secondary | ICD-10-CM | POA: Diagnosis present

## 2024-04-13 DIAGNOSIS — Z85828 Personal history of other malignant neoplasm of skin: Secondary | ICD-10-CM

## 2024-04-13 DIAGNOSIS — I130 Hypertensive heart and chronic kidney disease with heart failure and stage 1 through stage 4 chronic kidney disease, or unspecified chronic kidney disease: Secondary | ICD-10-CM | POA: Diagnosis present

## 2024-04-13 DIAGNOSIS — R110 Nausea: Secondary | ICD-10-CM | POA: Diagnosis not present

## 2024-04-13 DIAGNOSIS — Z8616 Personal history of COVID-19: Secondary | ICD-10-CM | POA: Diagnosis not present

## 2024-04-13 DIAGNOSIS — J449 Chronic obstructive pulmonary disease, unspecified: Secondary | ICD-10-CM | POA: Diagnosis present

## 2024-04-13 DIAGNOSIS — I509 Heart failure, unspecified: Secondary | ICD-10-CM | POA: Diagnosis not present

## 2024-04-13 DIAGNOSIS — N390 Urinary tract infection, site not specified: Secondary | ICD-10-CM | POA: Diagnosis not present

## 2024-04-13 DIAGNOSIS — Z8249 Family history of ischemic heart disease and other diseases of the circulatory system: Secondary | ICD-10-CM

## 2024-04-13 DIAGNOSIS — Z66 Do not resuscitate: Secondary | ICD-10-CM | POA: Diagnosis not present

## 2024-04-13 DIAGNOSIS — I482 Chronic atrial fibrillation, unspecified: Secondary | ICD-10-CM | POA: Diagnosis present

## 2024-04-13 DIAGNOSIS — I517 Cardiomegaly: Secondary | ICD-10-CM | POA: Diagnosis not present

## 2024-04-13 DIAGNOSIS — L89620 Pressure ulcer of left heel, unstageable: Secondary | ICD-10-CM | POA: Diagnosis not present

## 2024-04-13 DIAGNOSIS — I5033 Acute on chronic diastolic (congestive) heart failure: Secondary | ICD-10-CM | POA: Diagnosis present

## 2024-04-13 DIAGNOSIS — M8580 Other specified disorders of bone density and structure, unspecified site: Secondary | ICD-10-CM | POA: Diagnosis present

## 2024-04-13 DIAGNOSIS — N184 Chronic kidney disease, stage 4 (severe): Secondary | ICD-10-CM | POA: Diagnosis present

## 2024-04-13 DIAGNOSIS — J90 Pleural effusion, not elsewhere classified: Secondary | ICD-10-CM | POA: Diagnosis not present

## 2024-04-13 DIAGNOSIS — M6281 Muscle weakness (generalized): Secondary | ICD-10-CM | POA: Diagnosis not present

## 2024-04-13 DIAGNOSIS — I4891 Unspecified atrial fibrillation: Secondary | ICD-10-CM | POA: Diagnosis not present

## 2024-04-13 DIAGNOSIS — I495 Sick sinus syndrome: Secondary | ICD-10-CM | POA: Diagnosis present

## 2024-04-13 DIAGNOSIS — E785 Hyperlipidemia, unspecified: Secondary | ICD-10-CM | POA: Diagnosis present

## 2024-04-13 DIAGNOSIS — I5043 Acute on chronic combined systolic (congestive) and diastolic (congestive) heart failure: Secondary | ICD-10-CM | POA: Diagnosis not present

## 2024-04-13 DIAGNOSIS — Z1152 Encounter for screening for COVID-19: Secondary | ICD-10-CM | POA: Diagnosis not present

## 2024-04-13 DIAGNOSIS — R2689 Other abnormalities of gait and mobility: Principal | ICD-10-CM | POA: Diagnosis present

## 2024-04-13 DIAGNOSIS — Z7951 Long term (current) use of inhaled steroids: Secondary | ICD-10-CM

## 2024-04-13 DIAGNOSIS — B9620 Unspecified Escherichia coli [E. coli] as the cause of diseases classified elsewhere: Secondary | ICD-10-CM | POA: Diagnosis present

## 2024-04-13 DIAGNOSIS — M6259 Muscle wasting and atrophy, not elsewhere classified, multiple sites: Secondary | ICD-10-CM | POA: Diagnosis not present

## 2024-04-13 DIAGNOSIS — Z7901 Long term (current) use of anticoagulants: Secondary | ICD-10-CM

## 2024-04-13 DIAGNOSIS — I480 Paroxysmal atrial fibrillation: Secondary | ICD-10-CM | POA: Diagnosis present

## 2024-04-13 DIAGNOSIS — R918 Other nonspecific abnormal finding of lung field: Secondary | ICD-10-CM | POA: Diagnosis not present

## 2024-04-13 DIAGNOSIS — J441 Chronic obstructive pulmonary disease with (acute) exacerbation: Secondary | ICD-10-CM | POA: Diagnosis not present

## 2024-04-13 DIAGNOSIS — I10 Essential (primary) hypertension: Secondary | ICD-10-CM | POA: Diagnosis not present

## 2024-04-13 DIAGNOSIS — I110 Hypertensive heart disease with heart failure: Secondary | ICD-10-CM | POA: Diagnosis not present

## 2024-04-13 DIAGNOSIS — Z950 Presence of cardiac pacemaker: Secondary | ICD-10-CM | POA: Diagnosis not present

## 2024-04-13 DIAGNOSIS — Z96642 Presence of left artificial hip joint: Secondary | ICD-10-CM | POA: Diagnosis present

## 2024-04-13 DIAGNOSIS — Z9049 Acquired absence of other specified parts of digestive tract: Secondary | ICD-10-CM

## 2024-04-13 DIAGNOSIS — I5042 Chronic combined systolic (congestive) and diastolic (congestive) heart failure: Secondary | ICD-10-CM | POA: Diagnosis present

## 2024-04-13 DIAGNOSIS — R001 Bradycardia, unspecified: Secondary | ICD-10-CM | POA: Diagnosis present

## 2024-04-13 DIAGNOSIS — K5900 Constipation, unspecified: Secondary | ICD-10-CM | POA: Diagnosis not present

## 2024-04-13 DIAGNOSIS — Z79890 Hormone replacement therapy (postmenopausal): Secondary | ICD-10-CM

## 2024-04-13 DIAGNOSIS — E1122 Type 2 diabetes mellitus with diabetic chronic kidney disease: Secondary | ICD-10-CM | POA: Diagnosis not present

## 2024-04-13 DIAGNOSIS — R41841 Cognitive communication deficit: Secondary | ICD-10-CM | POA: Diagnosis not present

## 2024-04-13 DIAGNOSIS — E871 Hypo-osmolality and hyponatremia: Secondary | ICD-10-CM | POA: Diagnosis not present

## 2024-04-13 DIAGNOSIS — Z79899 Other long term (current) drug therapy: Secondary | ICD-10-CM

## 2024-04-13 DIAGNOSIS — E43 Unspecified severe protein-calorie malnutrition: Secondary | ICD-10-CM | POA: Diagnosis not present

## 2024-04-13 DIAGNOSIS — Z22359 Carrier of enterobacterales, unspecified: Secondary | ICD-10-CM

## 2024-04-13 DIAGNOSIS — J189 Pneumonia, unspecified organism: Secondary | ICD-10-CM | POA: Diagnosis not present

## 2024-04-13 DIAGNOSIS — Z7401 Bed confinement status: Secondary | ICD-10-CM | POA: Diagnosis not present

## 2024-04-13 DIAGNOSIS — E1129 Type 2 diabetes mellitus with other diabetic kidney complication: Secondary | ICD-10-CM | POA: Diagnosis not present

## 2024-04-13 DIAGNOSIS — Z96653 Presence of artificial knee joint, bilateral: Secondary | ICD-10-CM | POA: Diagnosis present

## 2024-04-13 DIAGNOSIS — R1312 Dysphagia, oropharyngeal phase: Secondary | ICD-10-CM | POA: Diagnosis not present

## 2024-04-13 LAB — URINALYSIS, ROUTINE W REFLEX MICROSCOPIC
Bilirubin Urine: NEGATIVE
Glucose, UA: NEGATIVE mg/dL
Hgb urine dipstick: NEGATIVE
Ketones, ur: NEGATIVE mg/dL
Nitrite: POSITIVE — AB
Protein, ur: 100 mg/dL — AB
Specific Gravity, Urine: 1.014 (ref 1.005–1.030)
Squamous Epithelial / HPF: 0 /HPF (ref 0–5)
WBC, UA: >50 WBC/hpf (ref 0–5)
pH: 5 (ref 5.0–8.0)

## 2024-04-13 LAB — CBC
HCT: 34.7 % — ABNORMAL LOW (ref 36.0–46.0)
Hemoglobin: 11.4 g/dL — ABNORMAL LOW (ref 12.0–15.0)
MCH: 30.4 pg (ref 26.0–34.0)
MCHC: 32.9 g/dL (ref 30.0–36.0)
MCV: 92.5 fL (ref 80.0–100.0)
Platelets: 200 K/uL (ref 150–400)
RBC: 3.75 MIL/uL — ABNORMAL LOW (ref 3.87–5.11)
RDW: 17.3 % — ABNORMAL HIGH (ref 11.5–15.5)
WBC: 4.7 K/uL (ref 4.0–10.5)
nRBC: 0 % (ref 0.0–0.2)

## 2024-04-13 LAB — RESP PANEL BY RT-PCR (RSV, FLU A&B, COVID)  RVPGX2
Influenza A by PCR: NEGATIVE
Influenza B by PCR: NEGATIVE
Resp Syncytial Virus by PCR: NEGATIVE
SARS Coronavirus 2 by RT PCR: NEGATIVE

## 2024-04-13 LAB — BASIC METABOLIC PANEL WITH GFR
Anion gap: 12 (ref 5–15)
BUN: 61 mg/dL — ABNORMAL HIGH (ref 8–23)
CO2: 22 mmol/L (ref 22–32)
Calcium: 7.9 mg/dL — ABNORMAL LOW (ref 8.9–10.3)
Chloride: 99 mmol/L (ref 98–111)
Creatinine, Ser: 2.74 mg/dL — ABNORMAL HIGH (ref 0.44–1.00)
GFR, Estimated: 16 mL/min — ABNORMAL LOW (ref >60)
Glucose, Bld: 112 mg/dL — ABNORMAL HIGH (ref 70–99)
Potassium: 4 mmol/L (ref 3.5–5.1)
Sodium: 133 mmol/L — ABNORMAL LOW (ref 135–145)

## 2024-04-13 LAB — T4, FREE: Free T4: 1.19 ng/dL — ABNORMAL HIGH (ref 0.61–1.12)

## 2024-04-13 LAB — TSH: TSH: 10.456 u[IU]/mL — ABNORMAL HIGH (ref 0.350–4.500)

## 2024-04-13 LAB — TROPONIN I (HIGH SENSITIVITY): Troponin I (High Sensitivity): 29 ng/L — ABNORMAL HIGH (ref <18)

## 2024-04-13 LAB — MAGNESIUM: Magnesium: 1.8 mg/dL (ref 1.7–2.4)

## 2024-04-13 LAB — BRAIN NATRIURETIC PEPTIDE: B Natriuretic Peptide: >4500 pg/mL — ABNORMAL HIGH (ref 0.0–100.0)

## 2024-04-13 MED ORDER — ALBUTEROL SULFATE (2.5 MG/3ML) 0.083% IN NEBU: 3.0000 mL | INHALATION_SOLUTION | Freq: Four times a day (QID) | RESPIRATORY_TRACT | Status: DC | PRN

## 2024-04-13 MED ORDER — LEVOTHYROXINE SODIUM 25 MCG PO TABS
25.0000 ug | ORAL_TABLET | Freq: Every day | ORAL | Status: DC
  Administered 2024-04-14 – 2024-04-16 (×3): 25 ug via ORAL
  Filled 2024-04-13 (×3): qty 1

## 2024-04-13 MED ORDER — SODIUM CHLORIDE 0.9 % IV SOLN
2.0000 g | Freq: Once | INTRAVENOUS | Status: AC
  Administered 2024-04-13: 2 g via INTRAVENOUS
  Filled 2024-04-13: qty 20

## 2024-04-13 MED ORDER — SODIUM CHLORIDE 0.9 % IV SOLN: 250.0000 mL | INTRAVENOUS | Status: AC | PRN

## 2024-04-13 MED ORDER — ACETAMINOPHEN 325 MG PO TABS: 650.0000 mg | ORAL_TABLET | Freq: Three times a day (TID) | ORAL | Status: DC | PRN

## 2024-04-13 MED ORDER — SENNOSIDES-DOCUSATE SODIUM 8.6-50 MG PO TABS: 2.0000 | ORAL_TABLET | Freq: Every evening | ORAL | Status: DC | PRN

## 2024-04-13 MED ORDER — APIXABAN 2.5 MG PO TABS
2.5000 mg | ORAL_TABLET | Freq: Two times a day (BID) | ORAL | Status: DC
  Administered 2024-04-13 – 2024-04-16 (×7): 2.5 mg via ORAL
  Filled 2024-04-13 (×8): qty 1

## 2024-04-13 MED ORDER — FUROSEMIDE 10 MG/ML IJ SOLN
20.0000 mg | Freq: Once | INTRAMUSCULAR | Status: AC
  Administered 2024-04-13: 20 mg via INTRAVENOUS
  Filled 2024-04-13: qty 4

## 2024-04-13 MED ORDER — SODIUM CHLORIDE 0.9 % IV SOLN
1.0000 g | INTRAVENOUS | Status: DC
  Administered 2024-04-14 – 2024-04-15 (×2): 1 g via INTRAVENOUS
  Filled 2024-04-13 (×2): qty 10

## 2024-04-13 MED ORDER — SODIUM CHLORIDE 0.9% FLUSH: 3.0000 mL | INTRAVENOUS | Status: DC | PRN

## 2024-04-13 MED ORDER — SODIUM CHLORIDE 0.9% FLUSH
3.0000 mL | Freq: Two times a day (BID) | INTRAVENOUS | Status: DC
  Administered 2024-04-13 – 2024-04-16 (×7): 3 mL via INTRAVENOUS

## 2024-04-13 MED ORDER — POLYETHYLENE GLYCOL 3350 17 G PO PACK: 17.0000 g | PACK | Freq: Every day | ORAL | Status: DC | PRN

## 2024-04-13 MED ORDER — AMIODARONE HCL 200 MG PO TABS
200.0000 mg | ORAL_TABLET | Freq: Every day | ORAL | Status: DC
  Administered 2024-04-13 – 2024-04-16 (×4): 200 mg via ORAL
  Filled 2024-04-13 (×4): qty 1

## 2024-04-13 MED ORDER — CHLORHEXIDINE GLUCONATE CLOTH 2 % EX PADS
6.0000 | MEDICATED_PAD | Freq: Every day | CUTANEOUS | Status: DC
Start: 2024-04-13 — End: 2024-04-16
  Administered 2024-04-13 – 2024-04-16 (×4): 6 via TOPICAL

## 2024-04-13 MED ORDER — CARVEDILOL 6.25 MG PO TABS
6.2500 mg | ORAL_TABLET | Freq: Two times a day (BID) | ORAL | Status: DC
Start: 2024-04-13 — End: 2024-04-15
  Administered 2024-04-13 – 2024-04-15 (×4): 6.25 mg via ORAL
  Filled 2024-04-13: qty 1
  Filled 2024-04-13 (×2): qty 2
  Filled 2024-04-13: qty 1

## 2024-04-13 MED ORDER — MIDODRINE HCL 5 MG PO TABS
10.0000 mg | ORAL_TABLET | Freq: Once | ORAL | Status: DC
  Filled 2024-04-13: qty 2

## 2024-04-13 MED ORDER — BUDESON-GLYCOPYRROL-FORMOTEROL 160-9-4.8 MCG/ACT IN AERO
2.0000 | INHALATION_SPRAY | Freq: Two times a day (BID) | RESPIRATORY_TRACT | Status: DC
Start: 2024-04-13 — End: 2024-04-16
  Administered 2024-04-13 – 2024-04-16 (×5): 2 via RESPIRATORY_TRACT
  Filled 2024-04-13 (×2): qty 5.9

## 2024-04-13 MED ORDER — PANTOPRAZOLE SODIUM 40 MG PO TBEC
40.0000 mg | DELAYED_RELEASE_TABLET | Freq: Two times a day (BID) | ORAL | Status: DC
  Administered 2024-04-13 – 2024-04-16 (×7): 40 mg via ORAL
  Filled 2024-04-13 (×7): qty 1

## 2024-04-13 MED ORDER — ONDANSETRON 4 MG PO TBDP
4.0000 mg | ORAL_TABLET | Freq: Once | ORAL | Status: AC
Start: 2024-04-13 — End: 2024-04-13
  Administered 2024-04-13: 4 mg via ORAL
  Filled 2024-04-13: qty 1

## 2024-04-13 MED ORDER — MELATONIN 5 MG PO TABS
5.0000 mg | ORAL_TABLET | Freq: Every evening | ORAL | Status: DC | PRN
  Administered 2024-04-15: 5 mg via ORAL
  Filled 2024-04-13: qty 1

## 2024-04-13 MED ORDER — ONDANSETRON HCL 4 MG/2ML IJ SOLN: 4.0000 mg | Freq: Four times a day (QID) | INTRAMUSCULAR | Status: DC | PRN

## 2024-04-13 MED ORDER — ONDANSETRON HCL 4 MG/2ML IJ SOLN
4.0000 mg | Freq: Once | INTRAMUSCULAR | Status: AC
  Administered 2024-04-13: 4 mg via INTRAVENOUS
  Filled 2024-04-13: qty 2

## 2024-04-13 MED ORDER — FUROSEMIDE 10 MG/ML IJ SOLN
20.0000 mg | Freq: Two times a day (BID) | INTRAMUSCULAR | Status: DC
  Administered 2024-04-13 – 2024-04-15 (×4): 20 mg via INTRAVENOUS
  Filled 2024-04-13 (×2): qty 2
  Filled 2024-04-13: qty 4
  Filled 2024-04-13: qty 2

## 2024-04-13 MED ORDER — HYDRALAZINE HCL 25 MG PO TABS
25.0000 mg | ORAL_TABLET | Freq: Three times a day (TID) | ORAL | Status: DC
Start: 2024-04-13 — End: 2024-04-15
  Administered 2024-04-13 – 2024-04-15 (×6): 25 mg via ORAL
  Filled 2024-04-13 (×7): qty 1

## 2024-04-13 NOTE — ED Triage Notes (Signed)
 Pt arrives from home via [facility]. Per EMS, pt lives at [facility] and was home for the night with daughter. C/O increased weakness/nausea.  Recent DX of Covid and Pneumonia.  Pt has pacemaker and chronic Foley.  HX CHF, COPD.  AOx3.

## 2024-04-13 NOTE — ED Notes (Signed)
 Called pharmacy to request meds be verified. Was informed that pharm tech awaiting fax from facility before meds can be verified. Pharm will readjust when meds due once verified.

## 2024-04-13 NOTE — ED Notes (Signed)
 RN called patient's daughter per request; Patient's daughter updated and reported would be by to visit patient later today. Patient informed per daughter's request.

## 2024-04-13 NOTE — ED Provider Notes (Signed)
 [facility] Provider Note    Event Date/Time   First MD Initiated Contact with Patient 04/13/24 [phone]     (approximate)   History   Weakness   HPI  Leslie Parker is a 89 y.o. female with history of atrial fibrillation, dCHF, COPD, hypertension, diabetes, chronic kidney disease, sick sinus syndrome status post pacemaker who presents emergency department generalized weakness, nausea without vomiting.  Denies chest pain, shortness of breath but is intermittently tachypneic here.  No abdominal pain.  No urinary symptoms.  States she feels so weak that she is unable to stand.  Lives in a nursing facility but was out with her daughter today when symptoms started.  Recently diagnosed with COVID pneumonia.   History provided by patient.    Past Medical History:  Diagnosis Date   Acute kidney injury superimposed on CKD (HCC) 11/24/2022   Asthma    Atrial fibrillation with RVR (HCC)    Cancer (HCC)    Basal Cell   CHF (congestive heart failure) (HCC)    Closed left hip fracture (HCC) 01/25/2017   Closed right hip fracture (HCC) 02/13/2023   Diabetes mellitus without complication (HCC)    Femur fracture, left (HCC) 02/03/2015   Heart murmur    Hypertension    Impetigo    Osteoarthritis    Osteopenia    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   Rapid atrial fibrillation, new onset(HCC) 06/05/2022   Vomiting    can not due to surgery   Wears dentures    full upper and lower    Past Surgical History:  Procedure Laterality Date   ABDOMINAL HYSTERECTOMY     BACK SURGERY     BLADDER SURGERY     mesh   CARPAL TUNNEL RELEASE Bilateral    CATARACT EXTRACTION Right 2017   CATARACT EXTRACTION W/PHACO Left 02/06/2016   Procedure: CATARACT EXTRACTION PHACO AND INTRAOCULAR LENS PLACEMENT (IOC) left eye;  Surgeon: Donzell Arlyce Budd, MD;  Location: [facility];  Service: Ophthalmology;  Laterality: Left;  DIABETIC LEFT Cannot arrive before 9:30    CERVICAL DISC SURGERY     CHOLECYSTECTOMY     EYE SURGERY Bilateral    Cataract Extraction with IOL   FEMUR IM NAIL Left 02/04/2015   Procedure: INTRAMEDULLARY (IM) NAIL FEMORAL;  Surgeon: Franky Cranker, MD;  Location: [facility];  Service: Orthopedics;  Laterality: Left;   HARDWARE REMOVAL Left 01/17/2017   Procedure: HARDWARE REMOVAL;  Surgeon: Cranker Franky, MD;  Location: [facility];  Service: Orthopedics;  Laterality: Left;   HERNIA REPAIR  2014   esophageal and gastric mesh. patient unable to throw up d/t mesh   HIP ARTHROPLASTY Left 01/26/2017   Procedure: ARTHROPLASTY BIPOLAR HIP (HEMIARTHROPLASTY) removal hardware left hip;  Surgeon: Cleotilde Barrio, MD;  Location: [facility];  Service: Orthopedics;  Laterality: Left;   INTRAMEDULLARY (IM) NAIL INTERTROCHANTERIC Right 02/13/2023   Procedure: INTRAMEDULLARY (IM) NAIL INTERTROCHANTERIC;  Surgeon: Edie Norleen PARAS, MD;  Location: [facility];  Service: Orthopedics;  Laterality: Right;   JOINT REPLACEMENT Bilateral    knees   PACEMAKER IMPLANT N/A 11/15/2022   Procedure: PACEMAKER IMPLANT;  Surgeon: Ammon Blunt, MD;  Location: [facility];  Service: Cardiovascular;  Laterality: N/A;   PACEMAKER IMPLANT N/A 11/28/2022   Procedure: PACEMAKER IMPLANT;  Surgeon: Ammon Blunt, MD;  Location: [facility];  Service: Cardiovascular;  Laterality: N/A;  Lead reposition   REPLACEMENT TOTAL KNEE BILATERAL Bilateral [id number]   SHOULDER ARTHROSCOPY WITH OPEN  ROTATOR CUFF REPAIR AND DISTAL CLAVICLE ACROMINECTOMY Left 10/25/2016   Procedure: SHOULDER ARTHROSCOPY WITH OPEN ROTATOR CUFF REPAIR AND DISTAL CLAVICLE ACROMINECTOMY;  Surgeon: Franky Cranker, MD;  Location: [facility];  Service: Orthopedics;  Laterality: Left;   THYROID  SURGERY     goiter removed   TOTAL HIP REVISION Left 12/02/2017   Procedure: TOTAL HIP REVISION;  Surgeon: Leora Lynwood SAUNDERS, MD;  Location: [facility];  Service: Orthopedics;  Laterality: Left;   TOTAL SHOULDER  REPLACEMENT Right 2012    MEDICATIONS:  Prior to Admission medications   Medication Sig Start Date End Date Taking? Authorizing Provider  acetaminophen  (TYLENOL ) 325 MG tablet Take 2 tablets (650 mg total) by mouth every 8 (eight) hours as needed for fever, headache or mild pain (pain score 1-3). 03/24/24   Von Bellis, MD  albuterol  (VENTOLIN  HFA) 108 [phone] Base) MCG/ACT inhaler Inhale 2 puffs into the lungs every 6 (six) hours as needed. 08/04/19   [provider]  alum & mag hydroxide-simeth (MAALOX/MYLANTA) 200-200-20 MG/5ML suspension Take 30 mLs by mouth every 6 (six) hours as needed for indigestion or heartburn. 11/29/23   Maree Hue, MD  amiodarone  (PACERONE ) 200 MG tablet Take 1 tablet by mouth daily. 08/26/23   [provider]  apixaban  (ELIQUIS ) 2.5 MG TABS tablet Take 1 tablet (2.5 mg total) by mouth 2 (two) times daily. 06/07/22   Jhonny Calvin NOVAK, MD  ascorbic acid  (VITAMIN C ) 250 MG tablet Take 250 mg by mouth daily.    [provider]  carvedilol  (COREG ) 6.25 MG tablet Take 1 tablet (6.25 mg total) by mouth 2 (two) times daily with a meal. 01/23/24 03/12/24  Maree Hue, MD  Cholecalciferol  (VITAMIN D -1000 MAX ST) 25 MCG (1000 UT) tablet Take 1,000 Units by mouth daily.    [provider]  guaiFENesin -dextromethorphan  (ROBITUSSIN DM) 100-10 MG/5ML syrup Take 10 mLs by mouth every 6 (six) hours as needed for cough. 03/24/24   Von Bellis, MD  hydrALAZINE  (APRESOLINE ) 25 MG tablet 25 mg 3 (three) times daily.    [provider]  levothyroxine  (SYNTHROID ) 25 MCG tablet Take 1 tablet (25 mcg total) by mouth daily at 6 (six) AM. 03/24/24   Von Bellis, MD  melatonin 5 MG TABS Take 1 tablet (5 mg total) by mouth at bedtime as needed. 03/24/24   Von Bellis, MD  Multiple Vitamin (MULTIVITAMIN WITH MINERALS) TABS tablet Take 1 tablet by mouth daily. One-A-Day Women's Vitamin    [provider]  ondansetron  (ZOFRAN ) 4 MG tablet Take 1 tablet  (4 mg total) by mouth every 6 (six) hours as needed for nausea. 11/17/23   Josette Ade, MD  pantoprazole  (PROTONIX ) 40 MG tablet Take 1 tablet (40 mg total) by mouth 2 (two) times daily. 03/01/24 03/31/24  Dezii, Alexandra, DO  polyethylene glycol (MIRALAX  / GLYCOLAX ) 17 g packet Take 17 g by mouth daily as needed. 11/29/23   Maree Hue, MD  senna-docusate (SENOKOT-S) 8.6-50 MG tablet Take 2 tablets by mouth at bedtime as needed for mild constipation. 11/29/23   Maree Hue, MD  TRELEGY ELLIPTA 100-62.5-25 MCG/ACT AEPB Inhale 1 puff into the lungs daily. 08/03/22   [provider]    Physical Exam   Triage Vital Signs: ED Triage Vitals  Encounter Vitals Group     BP 04/13/24 0252 103/62     Girls Systolic BP Percentile --      Girls Diastolic BP Percentile --      Boys Systolic BP Percentile --  Boys Diastolic BP Percentile --      Pulse Rate 04/13/24 0252 62     Resp 04/13/24 0252 20     Temp 04/13/24 0252 98.4 F (36.9 C)     Temp Source 04/13/24 0252 Oral     SpO2 04/13/24 0252 99 %     Weight 04/13/24 0258 159 lb (72.1 kg)     Height 04/13/24 0258 5' 9 (1.753 m)     Head Circumference --      Peak Flow --      Pain Score 04/13/24 0256 10     Pain Loc --      Pain Education --      Exclude from Growth Chart --     Most recent vital signs: Vitals:   04/13/24 0400 04/13/24 0430  BP: (!) 121/93 (!) 137/59  Pulse: 61 62  Resp: 20 16  Temp:    SpO2: 100% 100%    CONSTITUTIONAL: Alert, responds appropriately to questions.  Elderly, appears uncomfortable HEAD: Normocephalic, atraumatic EYES: Conjunctivae clear, pupils appear equal, sclera nonicteric ENT: normal nose; moist mucous membranes NECK: Supple, normal ROM CARD: RRR; S1 and S2 appreciated RESP: Intermittently tachypneic, breath sounds clear and equal bilaterally; no wheezes, no rhonchi, no rales, no hypoxia or respiratory distress, speaking full sentences ABD/GI: Non-distended; soft, non-tender, no  rebound, no guarding, no peritoneal signs BACK: The back appears normal EXT: Normal ROM in all joints; no deformity noted, no edema, no calf swelling or calf tenderness SKIN: Normal color for age and race; warm; no rash on exposed skin NEURO: Moves all extremities equally, normal speech, no facial asymmetry PSYCH: The patient's mood and manner are appropriate.   ED Results / Procedures / Treatments   LABS: (all labs ordered are listed, but only abnormal results are displayed) Labs Reviewed  CBC - Abnormal; Notable for the following components:      Result Value   RBC 3.75 (*)    Hemoglobin 11.4 (*)    HCT 34.7 (*)    RDW 17.3 (*)    All other components within normal limits  BASIC METABOLIC PANEL WITH GFR - Abnormal; Notable for the following components:   Sodium 133 (*)    Glucose, Bld 112 (*)    BUN 61 (*)    Creatinine, Ser 2.74 (*)    Calcium 7.9 (*)    GFR, Estimated 16 (*)    All other components within normal limits  URINALYSIS, ROUTINE W REFLEX MICROSCOPIC - Abnormal; Notable for the following components:   Color, Urine AMBER (*)    APPearance CLOUDY (*)    Protein, ur 100 (*)    Nitrite POSITIVE (*)    Leukocytes,Ua LARGE (*)    Bacteria, UA MANY (*)    Non Squamous Epithelial PRESENT (*)    All other components within normal limits  BRAIN NATRIURETIC PEPTIDE - Abnormal; Notable for the following components:   B Natriuretic Peptide >4,500.0 (*)    All other components within normal limits  TSH - Abnormal; Notable for the following components:   TSH 10.456 (*)    All other components within normal limits  T4, FREE - Abnormal; Notable for the following components:   Free T4 1.19 (*)    All other components within normal limits  TROPONIN I (HIGH SENSITIVITY) - Abnormal; Notable for the following components:   Troponin I (High Sensitivity) 29 (*)    All other components within normal limits  RESP PANEL BY RT-PCR (RSV, FLU  A&B, COVID)  RVPGX2  URINE CULTURE   MAGNESIUM      EKG:  EKG Interpretation Date/Time:  Monday April 13 2024 03:09:09 EDT Ventricular Rate:  73 PR Interval:  238 QRS Duration:  154 QT Interval:  432 QTC Calculation: 475 R Axis:   -72  Text Interpretation: AV dual-paced rhythm with prolonged AV conduction Abnormal ECG When compared with ECG of 13-Apr-2024 03:08, (unconfirmed) Previous ECG has undetermined rhythm, needs review Confirmed by Neomi Neptune [phone]) on 04/13/2024 3:57:00 AM         RADIOLOGY: My personal review and interpretation of imaging: Chest x-ray shows pulmonary edema, bilateral pleural effusions.  No pneumonia.  I have personally reviewed all radiology reports.   DG Chest Port 1 View Result Date: 04/13/2024 EXAM: 1 VIEW XRAY OF THE CHEST 04/13/2024 03:23:00 AM COMPARISON: 03/14/2024 CLINICAL HISTORY: Pt arrives from home via [facility]. Per EMS, pt lives at [facility] and was home for the night with daughter. C/O increased weakness/nausea. Recent DX of Covid and Pneumonia. Pt has pacemaker and chronic Foley. HX CHF, COPD. AOx3. FINDINGS: LINES, TUBES AND DEVICES: Left subclavian pacemaker. LUNGS AND PLEURA: Suspected mild perihilar edema. Small bilateral pleural effusions. HEART AND MEDIASTINUM: No acute abnormality of the cardiac and mediastinal silhouettes. BONES AND SOFT TISSUES: Right shoulder arthroplasty. No acute osseous abnormality. VASCULATURE: Thoracic aortic atherosclerosis. IMPRESSION: 1. Suspected mild perihilar edema. 2. Small bilateral pleural effusions. Electronically signed by: Pinkie Pebbles MD 04/13/2024 03:31 AM EDT RP Workstation: [id number]     PROCEDURES:  Critical Care performed: No     .1-3 Lead EKG Interpretation  Performed by: Dicie Edelen, Neptune SAILOR, DO Authorized by: Britlyn Martine, Neptune SAILOR, DO     Interpretation: normal     ECG rate:  62   ECG rate assessment: normal     Rhythm: sinus rhythm     Ectopy: none     Conduction: normal       IMPRESSION / MDM  / ASSESSMENT AND PLAN / ED COURSE  I reviewed the triage vital signs and the nursing notes.    Patient here for generalized weakness, nausea, tachypnea.  The patient is on the cardiac monitor to evaluate for evidence of arrhythmia and/or significant heart rate changes.   DIFFERENTIAL DIAGNOSIS (includes but not limited to):   Pneumonia, deconditioning, UTI, dehydration, ACS, PE, CHF, anemia, electrolyte derangement, thyroid  dysfunction   Patient's presentation is most consistent with acute presentation with potential threat to life or bodily function.   PLAN: Workup initiated from triage.  Hemoglobin of 11.4 which is her baseline.  Stable chronic kidney disease.  Normal blood glucose.  She is intermittently tachypneic here but denies chest pain or shortness of breath.  No hypoxia.  Chest x-ray reviewed and interpreted by myself and the radiologist and shows no pneumonia but does appear to show bilateral pleural effusions and edema.  Will give IV Lasix .  Troponin, BNP pending.  COVID, flu and RSV now negative.  She does appear to have a UTI.  Culture pending.  Will give Rocephin .  Will admit for IV diuresis, antibiotics, PT.   MEDICATIONS GIVEN IN ED: Medications  ondansetron  (ZOFRAN -ODT) disintegrating tablet 4 mg (4 mg Oral Given 04/13/24 0308)  cefTRIAXone  (ROCEPHIN ) 2 g in sodium chloride  0.9 % 100 mL IVPB (2 g Intravenous New Bag/Given 04/13/24 0432)  furosemide  (LASIX ) injection 20 mg (20 mg Intravenous Given 04/13/24 0437)  ondansetron  (ZOFRAN ) injection 4 mg (4 mg Intravenous Given 04/13/24 0431)     ED COURSE: BNP >  4500.  Troponin 29 which appears to be near her baseline.   TSH elevated and free T4 minimally elevated.  She has history of hyponatremia, hypokalemia and hypomagnesemia however electrolytes today are normal.  CONSULTS:  Consulted and discussed patient's case with hospitalist, Dr. Lawence.  I have recommended admission and consulting physician agrees and will place  admission orders.  Patient (and family if present) agree with this plan.   I reviewed all nursing notes, vitals, pertinent previous records.  All labs, EKGs, imaging ordered have been independently reviewed and interpreted by myself.    OUTSIDE RECORDS REVIEWED: Reviewed recent cardiology notes.       FINAL CLINICAL IMPRESSION(S) / ED DIAGNOSES   Final diagnoses:  Generalized weakness  Acute UTI  Acute on chronic diastolic congestive heart failure (HCC)     Rx / DC Orders   ED Discharge Orders     None        Note:  This document was prepared using Dragon voice recognition software and may include unintentional dictation errors.   Bristal Steffy, Josette SAILOR, DO 04/13/24 [phone]

## 2024-04-13 NOTE — H&P (Signed)
 History and Physical    Leslie Parker FMW:[id number] DOB: 07-Dec-1932 DOA: 04/13/2024  PCP: Sherial Bail, MD (Confirm with patient/family/NH records and if not entered, this has to be entered at [facility] point of entry) Patient coming from: SNF  I have personally briefly reviewed patient's old medical records in [facility]  Chief Complaint: Feeling weak  HPI: Leslie Parker is a 89 y.o. female with medical history significant of chronic HFrEF with LVEF 25-30%, SSS s/p PPM, PAF on Eliquis , HTN, HLD, IIDM, COPD, CKD stage IV, osteopenia, ileus, presented with generalized weakness.  Patient complains about severe general weakness yesterday and could not stand on her feet and family called EMS.  She denied any muscle or joint pain, she felt nauseous but no vomiting no diarrhea.  She is on chronic Foley but denied any dysuria.  No fever chills or back pain.  ED Course: Afebrile, blood pressure 140/50 will saturation 100% on room air.  UA showed 3+ WBC, blood work showed WBC 4.7 hemoglobin 11.4 BUN 61 creatinine 2.2 glucose 112 COVID negative.  Patient was started on ceftriaxone  in the ED.  Review of Systems: As per HPI otherwise 14 point review of systems negative.    Past Medical History:  Diagnosis Date   Acute kidney injury superimposed on CKD (HCC) 11/24/2022   Asthma    Atrial fibrillation with RVR (HCC)    Cancer (HCC)    Basal Cell   CHF (congestive heart failure) (HCC)    Closed left hip fracture (HCC) 01/25/2017   Closed right hip fracture (HCC) 02/13/2023   Diabetes mellitus without complication (HCC)    Femur fracture, left (HCC) 02/03/2015   Heart murmur    Hypertension    Impetigo    Osteoarthritis    Osteopenia    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   Rapid atrial fibrillation, new onset(HCC) 06/05/2022   Vomiting    can not due to surgery   Wears dentures    full upper and lower    Past Surgical History:  Procedure Laterality Date   ABDOMINAL  HYSTERECTOMY     BACK SURGERY     BLADDER SURGERY     mesh   CARPAL TUNNEL RELEASE Bilateral    CATARACT EXTRACTION Right 2017   CATARACT EXTRACTION W/PHACO Left 02/06/2016   Procedure: CATARACT EXTRACTION PHACO AND INTRAOCULAR LENS PLACEMENT (IOC) left eye;  Surgeon: Donzell Arlyce Budd, MD;  Location: [facility];  Service: Ophthalmology;  Laterality: Left;  DIABETIC LEFT Cannot arrive before 9:30   CERVICAL DISC SURGERY     CHOLECYSTECTOMY     EYE SURGERY Bilateral    Cataract Extraction with IOL   FEMUR IM NAIL Left 02/04/2015   Procedure: INTRAMEDULLARY (IM) NAIL FEMORAL;  Surgeon: Franky Cranker, MD;  Location: [facility];  Service: Orthopedics;  Laterality: Left;   HARDWARE REMOVAL Left 01/17/2017   Procedure: HARDWARE REMOVAL;  Surgeon: Cranker Franky, MD;  Location: [facility];  Service: Orthopedics;  Laterality: Left;   HERNIA REPAIR  2014   esophageal and gastric mesh. patient unable to throw up d/t mesh   HIP ARTHROPLASTY Left 01/26/2017   Procedure: ARTHROPLASTY BIPOLAR HIP (HEMIARTHROPLASTY) removal hardware left hip;  Surgeon: Cleotilde Barrio, MD;  Location: [facility];  Service: Orthopedics;  Laterality: Left;   INTRAMEDULLARY (IM) NAIL INTERTROCHANTERIC Right 02/13/2023   Procedure: INTRAMEDULLARY (IM) NAIL INTERTROCHANTERIC;  Surgeon: Edie Norleen PARAS, MD;  Location: [facility];  Service: Orthopedics;  Laterality: Right;   JOINT REPLACEMENT Bilateral  knees   PACEMAKER IMPLANT N/A 11/15/2022   Procedure: PACEMAKER IMPLANT;  Surgeon: Ammon Blunt, MD;  Location: [facility];  Service: Cardiovascular;  Laterality: N/A;   PACEMAKER IMPLANT N/A 11/28/2022   Procedure: PACEMAKER IMPLANT;  Surgeon: Ammon Blunt, MD;  Location: [facility];  Service: Cardiovascular;  Laterality: N/A;  Lead reposition   REPLACEMENT TOTAL KNEE BILATERAL Bilateral [id number]   SHOULDER ARTHROSCOPY WITH OPEN ROTATOR CUFF REPAIR AND DISTAL CLAVICLE ACROMINECTOMY Left  10/25/2016   Procedure: SHOULDER ARTHROSCOPY WITH OPEN ROTATOR CUFF REPAIR AND DISTAL CLAVICLE ACROMINECTOMY;  Surgeon: Franky Cranker, MD;  Location: [facility];  Service: Orthopedics;  Laterality: Left;   THYROID  SURGERY     goiter removed   TOTAL HIP REVISION Left 12/02/2017   Procedure: TOTAL HIP REVISION;  Surgeon: Leora Lynwood SAUNDERS, MD;  Location: [facility];  Service: Orthopedics;  Laterality: Left;   TOTAL SHOULDER REPLACEMENT Right 2012     reports that she has never smoked. She has never used smokeless tobacco. She reports that she does not drink alcohol and does not use drugs.  Allergies  Allergen Reactions   Baclofen Other (See Comments)    Elevated Cr   Simvastatin Other (See Comments)    Pt denies  Elevated LFTs with simva 40mg , stopped and resolved (see MD note 11/10/13)    Family History  Problem Relation Age of Onset   Heart failure Mother    Hypertension Mother    Emphysema Father     Prior to Admission medications   Medication Sig Start Date End Date Taking? Authorizing Provider  acetaminophen  (TYLENOL ) 325 MG tablet Take 2 tablets (650 mg total) by mouth every 8 (eight) hours as needed for fever, headache or mild pain (pain score 1-3). 03/24/24   Von Bellis, MD  albuterol  (VENTOLIN  HFA) 108 (90 Base) MCG/ACT inhaler Inhale 2 puffs into the lungs every 6 (six) hours as needed. 08/04/19   [provider]  alum & mag hydroxide-simeth (MAALOX/MYLANTA) 200-200-20 MG/5ML suspension Take 30 mLs by mouth every 6 (six) hours as needed for indigestion or heartburn. 11/29/23   Maree Hue, MD  amiodarone  (PACERONE ) 200 MG tablet Take 1 tablet by mouth daily. 08/26/23   [provider]  apixaban  (ELIQUIS ) 2.5 MG TABS tablet Take 1 tablet (2.5 mg total) by mouth 2 (two) times daily. 06/07/22   Jhonny Calvin NOVAK, MD  ascorbic acid  (VITAMIN C ) 250 MG tablet Take 250 mg by mouth daily.    [provider]  carvedilol  (COREG ) 6.25 MG tablet Take 1 tablet (6.25 mg  total) by mouth 2 (two) times daily with a meal. 01/23/24 03/12/24  Maree Hue, MD  Cholecalciferol  (VITAMIN D -1000 MAX ST) 25 MCG (1000 UT) tablet Take 1,000 Units by mouth daily.    [provider]  guaiFENesin -dextromethorphan  (ROBITUSSIN DM) 100-10 MG/5ML syrup Take 10 mLs by mouth every 6 (six) hours as needed for cough. 03/24/24   Von Bellis, MD  hydrALAZINE  (APRESOLINE ) 25 MG tablet 25 mg 3 (three) times daily.    [provider]  levothyroxine  (SYNTHROID ) 25 MCG tablet Take 1 tablet (25 mcg total) by mouth daily at 6 (six) AM. 03/24/24   Von Bellis, MD  melatonin 5 MG TABS Take 1 tablet (5 mg total) by mouth at bedtime as needed. 03/24/24   Von Bellis, MD  Multiple Vitamin (MULTIVITAMIN WITH MINERALS) TABS tablet Take 1 tablet by mouth daily. One-A-Day Women's Vitamin    [provider]  ondansetron  (ZOFRAN ) 4 MG  tablet Take 1 tablet (4 mg total) by mouth every 6 (six) hours as needed for nausea. 11/17/23   Josette Ade, MD  pantoprazole  (PROTONIX ) 40 MG tablet Take 1 tablet (40 mg total) by mouth 2 (two) times daily. 03/01/24 03/31/24  Dezii, Alexandra, DO  polyethylene glycol (MIRALAX  / GLYCOLAX ) 17 g packet Take 17 g by mouth daily as needed. 11/29/23   Maree Hue, MD  senna-docusate (SENOKOT-S) 8.6-50 MG tablet Take 2 tablets by mouth at bedtime as needed for mild constipation. 11/29/23   Maree Hue, MD  TRELEGY ELLIPTA 100-62.5-25 MCG/ACT AEPB Inhale 1 puff into the lungs daily. 08/03/22   [provider]    Physical Exam: Vitals:   04/13/24 0400 04/13/24 0430 04/13/24 0500 04/13/24 0600  BP: (!) 121/93 (!) 137/59 (!) 149/59 (!) 157/64  Pulse: 61 62 63 60  Resp: 20 16 12 12   Temp:    97.7 F (36.5 C)  TempSrc:    Oral  SpO2: 100% 100% 100% 98%  Weight:      Height:        Constitutional: NAD, calm, comfortable Vitals:   04/13/24 0400 04/13/24 0430 04/13/24 0500 04/13/24 0600  BP: (!) 121/93 (!) 137/59 (!) 149/59 (!) 157/64  Pulse: 61 62  63 60  Resp: 20 16 12 12   Temp:    97.7 F (36.5 C)  TempSrc:    Oral  SpO2: 100% 100% 100% 98%  Weight:      Height:       Eyes: PERRL, lids and conjunctivae normal ENMT: Mucous membranes are moist. Posterior pharynx clear of any exudate or lesions.Normal dentition.  Neck: normal, supple, no masses, no thyromegaly Respiratory: clear to auscultation bilaterally, no wheezing, bilateral fine crackles, increasing respiratory effort. No accessory muscle use.  Cardiovascular: Regular rate and rhythm, no murmurs / rubs / gallops. No extremity edema. 2+ pedal pulses. No carotid bruits.  Abdomen: no tenderness, no masses palpated. No hepatosplenomegaly. Bowel sounds positive.  Musculoskeletal: no clubbing / cyanosis. No joint deformity upper and lower extremities. Good ROM, no contractures. Normal muscle tone.  Skin: no rashes, lesions, ulcers. No induration Neurologic: CN 2-12 grossly intact. Sensation intact, DTR normal. Strength 5/5 in all 4.  Psychiatric: Normal judgment and insight. Alert and oriented x 3. Normal mood.     Labs on Admission: I have personally reviewed following labs and imaging studies  CBC: Recent Labs  Lab 04/13/24 0256  WBC 4.7  HGB 11.4*  HCT 34.7*  MCV 92.5  PLT 200   Basic Metabolic Panel: Recent Labs  Lab 04/13/24 0256  NA 133*  K 4.0  CL 99  CO2 22  GLUCOSE 112*  BUN 61*  CREATININE 2.74*  CALCIUM 7.9*  MG 1.8   GFR: Estimated Creatinine Clearance: 14.5 mL/min (A) (by C-G formula based on SCr of 2.74 mg/dL (H)). Liver Function Tests: No results for input(s): AST, ALT, ALKPHOS, BILITOT, PROT, ALBUMIN  in the last 168 hours. No results for input(s): LIPASE, AMYLASE in the last 168 hours. No results for input(s): AMMONIA in the last 168 hours. Coagulation Profile: No results for input(s): INR, PROTIME in the last 168 hours. Cardiac Enzymes: No results for input(s): CKTOTAL, CKMB, CKMBINDEX, TROPONINI in the last  168 hours. BNP (last 3 results) No results for input(s): PROBNP in the last 8760 hours. HbA1C: No results for input(s): HGBA1C in the last 72 hours. CBG: No results for input(s): GLUCAP in the last 168 hours. Lipid Profile: No results for input(s): CHOL,  HDL, LDLCALC, TRIG, CHOLHDL, LDLDIRECT in the last 72 hours. Thyroid  Function Tests: Recent Labs    04/13/24 0256  TSH 10.456*  FREET4 1.19*   Anemia Panel: No results for input(s): VITAMINB12, FOLATE, FERRITIN, TIBC, IRON, RETICCTPCT in the last 72 hours. Urine analysis:    Component Value Date/Time   COLORURINE AMBER (A) 04/13/2024 0321   APPEARANCEUR CLOUDY (A) 04/13/2024 0321   APPEARANCEUR Clear 02/19/2013 0254   LABSPEC 1.014 04/13/2024 0321   LABSPEC 1.006 02/19/2013 0254   PHURINE 5.0 04/13/2024 0321   GLUCOSEU NEGATIVE 04/13/2024 0321   GLUCOSEU Negative 02/19/2013 0254   HGBUR NEGATIVE 04/13/2024 0321   BILIRUBINUR NEGATIVE 04/13/2024 0321   BILIRUBINUR Negative 02/19/2013 0254   KETONESUR NEGATIVE 04/13/2024 0321   PROTEINUR 100 (A) 04/13/2024 0321   NITRITE POSITIVE (A) 04/13/2024 0321   LEUKOCYTESUR LARGE (A) 04/13/2024 0321   LEUKOCYTESUR 1+ 02/19/2013 0254    Radiological Exams on Admission: DG Chest Port 1 View Result Date: 04/13/2024 EXAM: 1 VIEW XRAY OF THE CHEST 04/13/2024 03:23:00 AM COMPARISON: 03/14/2024 CLINICAL HISTORY: Pt arrives from home via [facility]. Per EMS, pt lives at [facility] and was home for the night with daughter. C/O increased weakness/nausea. Recent DX of Covid and Pneumonia. Pt has pacemaker and chronic Foley. HX CHF, COPD. AOx3. FINDINGS: LINES, TUBES AND DEVICES: Left subclavian pacemaker. LUNGS AND PLEURA: Suspected mild perihilar edema. Small bilateral pleural effusions. HEART AND MEDIASTINUM: No acute abnormality of the cardiac and mediastinal silhouettes. BONES AND SOFT TISSUES: Right shoulder arthroplasty. No acute osseous abnormality.  VASCULATURE: Thoracic aortic atherosclerosis. IMPRESSION: 1. Suspected mild perihilar edema. 2. Small bilateral pleural effusions. Electronically signed by: Pinkie Pebbles MD 04/13/2024 03:31 AM EDT RP Workstation: [id number]    EKG: Independently reviewed.  Paced  Assessment/Plan Principal Problem:   Acute on chronic combined systolic and diastolic CHF (congestive heart failure) (HCC) Active Problems:   Acute on chronic diastolic CHF (congestive heart failure) (HCC)   CHF (congestive heart failure) (HCC)   UTI (urinary tract infection)  (please populate well all problems here in Problem List. (For example, if patient is on BP meds at home and you resume or decide to hold them, it is a problem that needs to be her. Same for CAD, COPD, HLD and so on)  Acute on chronic ambulation impairment Generalized weakness Complicated UTI - Continue ceftriaxone  - PT evaluation  Acute on chronic combined HFpEF and HFrEF decompensation - 1 dose of 20 mg IV Lasix  and continue Lasix  twice daily for today - Repeat kidney function and chest x-ray tomorrow to decide further diuresis plan - Echocardiogram was done earlier this year, will not repeat echocardiogram this time  HTN - BP stable, continue hydralazine  and Coreg   Chronic A-fib - Continue amiodarone  - Continue Eliquis   CKD stage IV - Volume overloaded, IV Lasix  x 1 - Repeat kidney function tomorrow  Sick euthyroid syndrome - Elevated TSH but normal to mild elevation of T4 - Continue current dose of Synthroid   DVT prophylaxis: Eliquis  Code Status: DNR/DNI Family Communication: ED physician discussed case with daughter overnight Disposition Plan: Patient sick with CHF decompensation and UTI, expect more than 2 midnight hospital stay for diuresis and monitoring kidney function. Consults called: None Admission status: Telemetry admission   Cort ONEIDA Mana MD [facility] Pager [phone]  04/13/2024, 9:15 AM

## 2024-04-13 NOTE — ED Notes (Signed)
Patient ate approx 75% of breakfast tray

## 2024-04-13 NOTE — ED Notes (Signed)
 Informed MD of delay in med admin due to awaiting fax from facility with med rec

## 2024-04-13 NOTE — ED Notes (Signed)
Report given to Kara RN.

## 2024-04-13 NOTE — ED Notes (Signed)
 Patient eating meal. Denies needs/concerns for nursing currently.

## 2024-04-14 ENCOUNTER — Telehealth: Payer: Self-pay

## 2024-04-14 DIAGNOSIS — I5043 Acute on chronic combined systolic (congestive) and diastolic (congestive) heart failure: Secondary | ICD-10-CM | POA: Diagnosis not present

## 2024-04-14 LAB — BASIC METABOLIC PANEL WITH GFR
Anion gap: 9 (ref 5–15)
BUN: 61 mg/dL — ABNORMAL HIGH (ref 8–23)
CO2: 22 mmol/L (ref 22–32)
Calcium: 7.7 mg/dL — ABNORMAL LOW (ref 8.9–10.3)
Chloride: 100 mmol/L (ref 98–111)
Creatinine, Ser: 2.76 mg/dL — ABNORMAL HIGH (ref 0.44–1.00)
GFR, Estimated: 16 mL/min — ABNORMAL LOW (ref >60)
Glucose, Bld: 83 mg/dL (ref 70–99)
Potassium: 4 mmol/L (ref 3.5–5.1)
Sodium: 131 mmol/L — ABNORMAL LOW (ref 135–145)

## 2024-04-14 LAB — GLUCOSE, CAPILLARY: Glucose-Capillary: 147 mg/dL — ABNORMAL HIGH (ref 70–99)

## 2024-04-14 LAB — PROCALCITONIN: Procalcitonin: <0.1 ng/mL

## 2024-04-14 NOTE — Progress Notes (Signed)
 Mobility Specialist Progress Note:    04/14/24 1410  Mobility  Activity Refused and notified nurse if applicable   Pt kindly refused mobility d/t nausea, asked this author to come back at a later time. All needs met.  Sherrilee Ditty Mobility Specialist Please contact via Special educational needs teacher or  Rehab office at [phone]

## 2024-04-14 NOTE — Progress Notes (Addendum)
 PROGRESS NOTE   HPI was taken from Dr. Laurita: Leslie Parker is a 89 y.o. female with medical history significant of chronic HFrEF with LVEF 25-30%, SSS s/p PPM, PAF on Eliquis , HTN, HLD, IIDM, COPD, CKD stage IV, osteopenia, ileus, presented with generalized weakness.   Patient complains about severe general weakness yesterday and could not stand on her feet and family called EMS.  She denied any muscle or joint pain, she felt nauseous but no vomiting no diarrhea.  She is on chronic Foley but denied any dysuria.  No fever chills or back pain.   ED Course: Afebrile, blood pressure 140/50 will saturation 100% on room air.  UA showed 3+ WBC, blood work showed WBC 4.7 hemoglobin 11.4 BUN 61 creatinine 2.2 glucose 112 COVID negative.   Patient was started on ceftriaxone  in the ED.     Leslie Parker  FMW:[id number] DOB: 01-15-1933 DOA: 04/13/2024 PCP: Sherial Bail, MD  Assessment & Plan:   Principal Problem:   Acute on chronic combined systolic and diastolic CHF (congestive heart failure) (HCC) Active Problems:   Acute on chronic diastolic CHF (congestive heart failure) (HCC)   CHF (congestive heart failure) (HCC)   UTI (urinary tract infection)  Assessment and Plan:  Acute on chronic ambulation impairment: w/ generalized weakness. PT/OT consulted   Acute on chronic combined CHF exacerbation: continue on IV lasix . Monitor I/Os. Continue on coreg . BNP >4,500.   Complicated UTI: secondary to chronic vs subacute foley (not sure when foley was initially placed.) Initially placed secondary to urinary retention as per pt. Recommend trying a voiding trial prior to d/c. UA is positive. Urine cx is pending. Continue on IV rocephin    Hyponatremia: labile. Will continue to monitor    HTN: continue on home dose of coreg , hydralazine    Chronic A-fib: continue on home dose of amio, eliquis     CKDIV: Cr is labile. Avoid nephrotoxic meds    Sick euthyroid syndrome: continue on home dose of  levothyroxine       DVT prophylaxis: eliquis   Code Status: DNR Family Communication: discussed pt's care w/ pt's daughter, Delynn, and answered her questions  Disposition Plan: depends on PT/OT recs   Level of care: Telemetry Medical Status is: Inpatient Remains inpatient appropriate because: severity of illness, requiring IV abxs    Consultants:    Procedures:   Antimicrobials: rocephin     Subjective: Pt c/o malaise  Objective: Vitals:   04/13/24 2040 04/14/24 0354 04/14/24 0500 04/14/24 0816  BP: (!) 118/54 (!) 116/42  138/60  Pulse: 60 (!) 59  64  Resp: 18 16  18   Temp: (!) 97.5 F (36.4 C) 97.7 F (36.5 C)  97.7 F (36.5 C)  TempSrc:      SpO2: 97% 96%  96%  Weight:   65.9 kg   Height:        Intake/Output Summary (Last 24 hours) at 04/14/2024 0916 Last data filed at 04/14/2024 0622 Gross per 24 hour  Intake 3 ml  Output 1675 ml  Net -1672 ml   Filed Weights   04/13/24 0258 04/14/24 0500  Weight: 72.1 kg 65.9 kg    Examination:  General exam: Appears calm and comfortable  Respiratory system: Clear to auscultation. Respiratory effort normal. Cardiovascular system: irregularly irregular. No rubs, gallops or clicks.  Gastrointestinal system: Abdomen is nondistended, soft and nontender.  Normal bowel sounds heard. Central nervous system: Alert and oriented.moves all extremities Psychiatry: Judgement and insight appears at baseline. Mood & affect appropriate.  Data Reviewed: I have personally reviewed following labs and imaging studies  CBC: Recent Labs  Lab 04/13/24 0256  WBC 4.7  HGB 11.4*  HCT 34.7*  MCV 92.5  PLT 200   Basic Metabolic Panel: Recent Labs  Lab 04/13/24 0256 04/14/24 0323  NA 133* 131*  K 4.0 4.0  CL 99 100  CO2 22 22  GLUCOSE 112* 83  BUN 61* 61*  CREATININE 2.74* 2.76*  CALCIUM 7.9* 7.7*  MG 1.8  --    GFR: Estimated Creatinine Clearance: 14.4 mL/min (A) (by C-G formula based on SCr of 2.76 mg/dL  (H)). Liver Function Tests: No results for input(s): AST, ALT, ALKPHOS, BILITOT, PROT, ALBUMIN  in the last 168 hours. No results for input(s): LIPASE, AMYLASE in the last 168 hours. No results for input(s): AMMONIA in the last 168 hours. Coagulation Profile: No results for input(s): INR, PROTIME in the last 168 hours. Cardiac Enzymes: No results for input(s): CKTOTAL, CKMB, CKMBINDEX, TROPONINI in the last 168 hours. BNP (last 3 results) No results for input(s): PROBNP in the last 8760 hours. HbA1C: No results for input(s): HGBA1C in the last 72 hours. CBG: No results for input(s): GLUCAP in the last 168 hours. Lipid Profile: No results for input(s): CHOL, HDL, LDLCALC, TRIG, CHOLHDL, LDLDIRECT in the last 72 hours. Thyroid  Function Tests: Recent Labs    04/13/24 0256  TSH 10.456*  FREET4 1.19*   Anemia Panel: No results for input(s): VITAMINB12, FOLATE, FERRITIN, TIBC, IRON, RETICCTPCT in the last 72 hours. Sepsis Labs: No results for input(s): PROCALCITON, LATICACIDVEN in the last 168 hours.  Recent Results (from the past 240 hours)  Resp panel by RT-PCR (RSV, Flu A&B, Covid) Anterior Nasal Swab     Status: None   Collection Time: 04/13/24  2:56 AM   Specimen: Anterior Nasal Swab  Result Value Ref Range Status   SARS Coronavirus 2 by RT PCR NEGATIVE NEGATIVE Final    Comment: (NOTE) SARS-CoV-2 target nucleic acids are NOT DETECTED.  The SARS-CoV-2 RNA is generally detectable in upper respiratory specimens during the acute phase of infection. The lowest concentration of SARS-CoV-2 viral copies this assay can detect is 138 copies/mL. A negative result does not preclude SARS-Cov-2 infection and should not be used as the sole basis for treatment or other patient management decisions. A negative result may occur with  improper specimen collection/handling, submission of specimen other than nasopharyngeal swab,  presence of viral mutation(s) within the areas targeted by this assay, and inadequate number of viral copies(<138 copies/mL). A negative result must be combined with clinical observations, patient history, and epidemiological information. The expected result is Negative.  Fact Sheet for Patients:  BloggerCourse.com  Fact Sheet for Healthcare Providers:  SeriousBroker.it  This test is no t yet approved or cleared by the [place]  FDA and  has been authorized for detection and/or diagnosis of SARS-CoV-2 by FDA under an Emergency Use Authorization (EUA). This EUA will remain  in effect (meaning this test can be used) for the duration of the COVID-19 declaration under Section 564(b)(1) of the Act, 21 U.S.C.section 360bbb-3(b)(1), unless the authorization is terminated  or revoked sooner.       Influenza A by PCR NEGATIVE NEGATIVE Final   Influenza B by PCR NEGATIVE NEGATIVE Final    Comment: (NOTE) The Xpert Xpress SARS-CoV-2/FLU/RSV plus assay is intended as an aid in the diagnosis of influenza from Nasopharyngeal swab specimens and should not be used as a sole basis for treatment. Nasal washings and  aspirates are unacceptable for Xpert Xpress SARS-CoV-2/FLU/RSV testing.  Fact Sheet for Patients: BloggerCourse.com  Fact Sheet for Healthcare Providers: SeriousBroker.it  This test is not yet approved or cleared by the [place]  FDA and has been authorized for detection and/or diagnosis of SARS-CoV-2 by FDA under an Emergency Use Authorization (EUA). This EUA will remain in effect (meaning this test can be used) for the duration of the COVID-19 declaration under Section 564(b)(1) of the Act, 21 U.S.C. section 360bbb-3(b)(1), unless the authorization is terminated or revoked.     Resp Syncytial Virus by PCR NEGATIVE NEGATIVE Final    Comment: (NOTE) Fact Sheet for  Patients: BloggerCourse.com  Fact Sheet for Healthcare Providers: SeriousBroker.it  This test is not yet approved or cleared by the [place]  FDA and has been authorized for detection and/or diagnosis of SARS-CoV-2 by FDA under an Emergency Use Authorization (EUA). This EUA will remain in effect (meaning this test can be used) for the duration of the COVID-19 declaration under Section 564(b)(1) of the Act, 21 U.S.C. section 360bbb-3(b)(1), unless the authorization is terminated or revoked.  Performed at [facility], [address] [place], [place] [address]          Radiology Studies: DG Chest 1 View Result Date: 04/13/2024 EXAM: 1 VIEW XRAY OF THE CHEST 04/13/2024 01:07:12 PM COMPARISON: 04/13/2024 CLINICAL HISTORY: CHF (congestive heart failure) (HCC) 97293. Congestive heart failure. FINDINGS: LINES, TUBES AND DEVICES: Left chest pacemaker in place. LUNGS AND PLEURA: Persistent bibasilar opacities, likely a combination of small bilateral pleural effusions and adjacent atelectasis. Mild perihilar edema. Skin fold over the lateral left hemithorax. HEART AND MEDIASTINUM: Cardiomegaly, unchanged. Aortic atherosclerosis. BONES AND SOFT TISSUES: Right shoulder arthroplasty. Left rotator cuff repair. Patient rotated to the right. IMPRESSION: 1. Persistent bibasilar opacities, likely a combination of small bilateral pleural effusions and adjacent atelectasis. 2. Similar mild pulmonary edema. Electronically signed by: Rockey Kilts MD 04/13/2024 01:39 PM EDT RP Workstation: [id number]   DG Chest Port 1 View Result Date: 04/13/2024 EXAM: 1 VIEW XRAY OF THE CHEST 04/13/2024 03:23:00 AM COMPARISON: 03/14/2024 CLINICAL HISTORY: Pt arrives from home via [facility]. Per EMS, pt lives at [facility] and was home for the night with daughter. C/O increased weakness/nausea. Recent DX of Covid and Pneumonia. Pt has pacemaker and  chronic Foley. HX CHF, COPD. AOx3. FINDINGS: LINES, TUBES AND DEVICES: Left subclavian pacemaker. LUNGS AND PLEURA: Suspected mild perihilar edema. Small bilateral pleural effusions. HEART AND MEDIASTINUM: No acute abnormality of the cardiac and mediastinal silhouettes. BONES AND SOFT TISSUES: Right shoulder arthroplasty. No acute osseous abnormality. VASCULATURE: Thoracic aortic atherosclerosis. IMPRESSION: 1. Suspected mild perihilar edema. 2. Small bilateral pleural effusions. Electronically signed by: Pinkie Pebbles MD 04/13/2024 03:31 AM EDT RP Workstation: [id number]        Scheduled Meds:  amiodarone   200 mg Oral Daily   apixaban   2.5 mg Oral BID   budesonide -glycopyrrolate -formoterol   2 puff Inhalation BID   carvedilol   6.25 mg Oral BID WC   Chlorhexidine  Gluconate Cloth  6 each Topical Daily   furosemide   20 mg Intravenous BID   hydrALAZINE   25 mg Oral TID   levothyroxine   25 mcg Oral Q0600   pantoprazole   40 mg Oral BID   sodium chloride  flush  3 mL Intravenous Q12H   Continuous Infusions:  cefTRIAXone  (ROCEPHIN )  IV 1 g (04/14/24 0851)     LOS: 1 day       Anthony CHRISTELLA Pouch, MD [facility] Pager [phone]  If 7PM-7AM, please contact night-coverage [email or web address]  04/14/2024, 9:16 AM

## 2024-04-14 NOTE — Plan of Care (Signed)
  Problem: Clinical Measurements: Goal: Ability to maintain clinical measurements within normal limits will improve Outcome: Progressing   Problem: Pain Managment: Goal: General experience of comfort will improve and/or be controlled Outcome: Progressing   Problem: Safety: Goal: Ability to remain free from injury will improve Outcome: Progressing

## 2024-04-14 NOTE — Progress Notes (Signed)
 Heart Failure Navigator Progress Note Assessed for Heart & Vascular TOC clinic readiness.  Patient does not meet criteria due to current Advanced Heart Failure patient of Ellouise Class, FNP.  Patient had a scheduled follow-up 04/15/24 and due to this hospitalization that was changed to 04/22/24 @ 2:00 PM. Patient aware of rescheduled appointment.    Navigator will sign of at this time.  Charmaine Pines, RN, BSN [facility] Heart Failure Navigator Secure Chat Only

## 2024-04-14 NOTE — Evaluation (Signed)
 Physical Therapy Evaluation Patient Details Name: Leslie Parker MRN: [id number] DOB: 02-20-1933 Today's Date: 04/14/2024  History of Present Illness  Patient is a 89 year old female with generalized weakness. PMH: HFrEF with LVEF 25-30%, SSS s/p PPM, PAF on Eliquis , HTN, HLD, IIDM, COPD, CKD stage IV, osteopenia, ileus.  Clinical Impression  Patient is agreeable to PT session. She reports she has been working with PT at [facility] and can ambulate using rolling walker with assistance. She reports using the wheelchair primarily.  Today the patient has generalized weakness and is fatigued with minimal activity. She was able to sit up on edge of bed and maintain sitting balance. However, activity tolerance limited by nausea (without vomiting). Recommend to continue PT to maximize independence and to decrease caregiver burden. Rehabilitation < 3 hours/day recommended after this hospital stay.       If plan is discharge home, recommend the following: A little help with walking and/or transfers;A little help with bathing/dressing/bathroom;Assist for transportation;Help with stairs or ramp for entrance;Assistance with cooking/housework   Can travel by private vehicle   No    Equipment Recommendations None recommended by PT  Recommendations for Other Services       Functional Status Assessment Patient has had a recent decline in their functional status and demonstrates the ability to make significant improvements in function in a reasonable and predictable amount of time.     Precautions / Restrictions Precautions Precautions: Fall Recall of Precautions/Restrictions: Intact Restrictions Weight Bearing Restrictions Per Provider Order: No      Mobility  Bed Mobility Overal bed mobility: Needs Assistance Bed Mobility: Supine to Sit, Sit to Supine     Supine to sit: Min assist Sit to supine: Min assist   General bed mobility comments: assistance for trunk support to sit upright. assistance for  LE support to return to bed    Transfers                   General transfer comment: deferred as patient reports nausea with sitting and requesting to return to bed    Ambulation/Gait                  Stairs            Wheelchair Mobility     Tilt Bed    Modified Rankin (Stroke Patients Only)       Balance Overall balance assessment: Needs assistance Sitting-balance support: Feet supported, Bilateral upper extremity supported Sitting balance-[name]: Fair Sitting balance - Comments: no loss of balance while seated edge of bed.                                     Pertinent Vitals/Pain Pain Assessment Pain Assessment: Faces Faces Pain Scale: Hurts a little bit Pain Location: stomach Pain Descriptors / Indicators: Discomfort Pain Intervention(s): Monitored during session, Limited activity within patient's tolerance, Repositioned    Home Living Family/patient expects to be discharged to:: Skilled nursing facility [facility])                        Prior Function Prior Level of Function : Needs assist             Mobility Comments: ambulation with staff assistance with rolling walker. uses the wheelchair as primary means of mobility       Extremity/Trunk Assessment   Upper  Extremity Assessment Upper Extremity Assessment: Generalized weakness    Lower Extremity Assessment Lower Extremity Assessment: Generalized weakness       Communication   Communication Communication: No apparent difficulties    Cognition Arousal: Alert Behavior During Therapy: WFL for tasks assessed/performed   PT - Cognitive impairments: No apparent impairments                         Following commands: Intact       Cueing Cueing Techniques: Verbal cues     General Comments General comments (skin integrity, edema, etc.): activity tolerance limited by nausea. patient appears generally weak and fatigue  with minimal activity    Exercises     Assessment/Plan    PT Assessment Patient needs continued PT services  PT Problem List Decreased strength;Decreased range of motion;Decreased activity tolerance;Decreased balance;Decreased mobility       PT Treatment Interventions DME instruction;Gait training;Functional mobility training;Therapeutic activities;Therapeutic exercise;Balance training;Neuromuscular re-education;Cognitive remediation;Patient/family education;Wheelchair mobility training    PT Goals (Current goals can be found in the Care Plan section)  Acute Rehab PT Goals Patient Stated Goal: to feel better PT Goal Formulation: With patient Time For Goal Achievement: 04/28/24 Potential to Achieve Goals: Fair    Frequency Min 1X/week     Co-evaluation               AM-PAC PT 6 Clicks Mobility  Outcome Measure Help needed turning from your back to your side while in a flat bed without using bedrails?: A Little Help needed moving from lying on your back to sitting on the side of a flat bed without using bedrails?: A Little Help needed moving to and from a bed to a chair (including a wheelchair)?: A Little Help needed standing up from a chair using your arms (e.g., wheelchair or bedside chair)?: A Little Help needed to walk in hospital room?: A Lot Help needed climbing 3-5 steps with a railing? : Total 6 Click Score: 15    End of Session   Activity Tolerance: Patient tolerated treatment well Patient left: in bed;with call bell/phone within reach;with bed alarm set Nurse Communication: Mobility status (request for nausea medication) PT Visit Diagnosis: Muscle weakness (generalized) (M62.81);Unsteadiness on feet (R26.81)    Time: 8986-8978 PT Time Calculation (min) (ACUTE ONLY): 8 min   Charges:   PT Evaluation $PT Eval Low Complexity: 1 Low   PT General Charges $$ ACUTE PT VISIT: 1 Visit         [name], PT, MPT   [name] 04/14/2024,  10:46 AM

## 2024-04-14 NOTE — TOC CM/SW Note (Signed)
 Transition of Care [facility]) - Inpatient Brief Assessment   Patient Details  Name: Leslie Parker MRN: [id number] Date of Birth: 01/30/1933  Transition of Care [facility]) CM/SW Contact:    Alfonso Rummer, LCSW Phone Number: 04/14/2024, 12:28 PM   Clinical Narrative:  KEN DELENA Rummer completed TOC chart review. Pt currently iv abx and lasix . Discharge is not determined due to pt not medically ready. LCSW A Aadi Bordner spk with Massie at [facility] report pt is short term and able to return to [facility] once she is medically ready.   Transition of Care Asessment:

## 2024-04-15 ENCOUNTER — Encounter: Admitting: Family

## 2024-04-15 DIAGNOSIS — R1312 Dysphagia, oropharyngeal phase: Secondary | ICD-10-CM | POA: Diagnosis not present

## 2024-04-15 DIAGNOSIS — I5043 Acute on chronic combined systolic (congestive) and diastolic (congestive) heart failure: Secondary | ICD-10-CM | POA: Diagnosis not present

## 2024-04-15 DIAGNOSIS — J441 Chronic obstructive pulmonary disease with (acute) exacerbation: Secondary | ICD-10-CM | POA: Diagnosis not present

## 2024-04-15 DIAGNOSIS — R41841 Cognitive communication deficit: Secondary | ICD-10-CM | POA: Diagnosis not present

## 2024-04-15 LAB — BASIC METABOLIC PANEL WITH GFR
Anion gap: 8 (ref 5–15)
BUN: 70 mg/dL — ABNORMAL HIGH (ref 8–23)
CO2: 23 mmol/L (ref 22–32)
Calcium: 7.8 mg/dL — ABNORMAL LOW (ref 8.9–10.3)
Chloride: 102 mmol/L (ref 98–111)
Creatinine, Ser: 3.06 mg/dL — ABNORMAL HIGH (ref 0.44–1.00)
GFR, Estimated: 14 mL/min — ABNORMAL LOW (ref >60)
Glucose, Bld: 124 mg/dL — ABNORMAL HIGH (ref 70–99)
Potassium: 4.1 mmol/L (ref 3.5–5.1)
Sodium: 133 mmol/L — ABNORMAL LOW (ref 135–145)

## 2024-04-15 LAB — CBC
HCT: 30.1 % — ABNORMAL LOW (ref 36.0–46.0)
Hemoglobin: 10.2 g/dL — ABNORMAL LOW (ref 12.0–15.0)
MCH: 30.9 pg (ref 26.0–34.0)
MCHC: 33.9 g/dL (ref 30.0–36.0)
MCV: 91.2 fL (ref 80.0–100.0)
Platelets: 186 K/uL (ref 150–400)
RBC: 3.3 MIL/uL — ABNORMAL LOW (ref 3.87–5.11)
RDW: 17.2 % — ABNORMAL HIGH (ref 11.5–15.5)
WBC: 5.4 K/uL (ref 4.0–10.5)
nRBC: 0 % (ref 0.0–0.2)

## 2024-04-15 LAB — GLUCOSE, CAPILLARY: Glucose-Capillary: 174 mg/dL — ABNORMAL HIGH (ref 70–99)

## 2024-04-15 LAB — URINE CULTURE: Culture: 80000 — AB

## 2024-04-15 MED ORDER — SODIUM CHLORIDE 0.9% FLUSH: 10.0000 mL | INTRAVENOUS | Status: DC | PRN

## 2024-04-15 MED ORDER — CARVEDILOL 3.125 MG PO TABS
3.1250 mg | ORAL_TABLET | Freq: Two times a day (BID) | ORAL | Status: DC
  Administered 2024-04-16: 3.125 mg via ORAL
  Filled 2024-04-15: qty 1

## 2024-04-15 NOTE — Progress Notes (Signed)
 Mobility Specialist Progress Note:    04/15/24 1114  Mobility  Activity Ambulated with assistance;Pivoted/transferred from bed to chair  Level of Assistance Contact guard assist, steadying assist  Assistive Device Front wheel walker  Distance Ambulated (ft) 5 ft  Range of Motion/Exercises Active;All extremities  Activity Response Tolerated well  Mobility visit 1 Mobility  Mobility Specialist Start Time (ACUTE ONLY) 1101  Mobility Specialist Stop Time (ACUTE ONLY) 1114  Mobility Specialist Time Calculation (min) (ACUTE ONLY) 13 min   Agreeable to mobility, required MinA/CGA to stand and transfer with RW. Tolerated well, c/o nausea while getting OOB. Alarm on, belongings in reach. All needs met.  Sherrilee Ditty Mobility Specialist Please contact via Special educational needs teacher or  Rehab office at [phone]

## 2024-04-15 NOTE — Progress Notes (Signed)
 Physical Therapy Treatment Patient Details Name: Leslie Parker MRN: [id number] DOB: 12/04/1932 Today's Date: 04/15/2024   History of Present Illness Patient is a 89 year old female with generalized weakness. PMH: HFrEF with LVEF 25-30%, SSS s/p PPM, PAF on Eliquis , HTN, HLD, IIDM, COPD, CKD stage IV, osteopenia, ileus.    PT Comments  Patient is agreeable to PT session. She was seated in the recliner chair. She was able to stand with Max A. Bowel incontinence with standing. Activity tolerance limited by fatigue and continued nausea reported. Recommend to continue PT to maximize independence and facilitate return prior level of function. Rehabilitation < 3 hours/day recommended after this hospital stay.    If plan is discharge home, recommend the following: A little help with walking and/or transfers;A little help with bathing/dressing/bathroom;Assist for transportation;Help with stairs or ramp for entrance;Assistance with cooking/housework   Can travel by private vehicle     No  Equipment Recommendations  None recommended by PT    Recommendations for Other Services       Precautions / Restrictions Precautions Precautions: Fall Recall of Precautions/Restrictions: Intact Restrictions Weight Bearing Restrictions Per Provider Order: No     Mobility  Bed Mobility               General bed mobility comments: not assessed as patient sitting up on arrival and post session    Transfers Overall transfer level: Needs assistance Equipment used: Rolling walker (2 wheels) Transfers: Sit to/from Stand Sit to Stand: Max assist           General transfer comment: significant lifting assistance required for standing from chair. cues for hand placement for safety. patient is incontinent of bowels with standing.    Ambulation/Gait               General Gait Details: activity tolerance limited by fatigue. unable to walk this date   Stairs             Wheelchair  Mobility     Tilt Bed    Modified Rankin (Stroke Patients Only)       Balance Overall balance assessment: Needs assistance Sitting-balance support: Feet supported, Bilateral upper extremity supported Sitting balance-[name]: Good     Standing balance support: Bilateral upper extremity supported, During functional activity, Reliant on assistive device for balance Standing balance-[name]: Poor Standing balance comment: external support required with heavy reliance on rolling walker for support                            Communication Communication Communication: No apparent difficulties  Cognition Arousal: Alert Behavior During Therapy: WFL for tasks assessed/performed   PT - Cognitive impairments: No apparent impairments                         Following commands: Intact      Cueing Cueing Techniques: Verbal cues  Exercises      General Comments        Pertinent Vitals/Pain Pain Assessment Pain Assessment: No/denies pain    Home Living                          Prior Function            PT Goals (current goals can now be found in the care plan section) Acute Rehab PT Goals Patient Stated Goal: to feel better PT Goal  Formulation: With patient Time For Goal Achievement: 04/28/24 Potential to Achieve Goals: Fair Progress towards PT goals: Progressing toward goals    Frequency    Min 1X/week      PT Plan      Co-evaluation              AM-PAC PT 6 Clicks Mobility   Outcome Measure  Help needed turning from your back to your side while in a flat bed without using bedrails?: A Little Help needed moving from lying on your back to sitting on the side of a flat bed without using bedrails?: A Little Help needed moving to and from a bed to a chair (including a wheelchair)?: A Little Help needed standing up from a chair using your arms (e.g., wheelchair or bedside chair)?: A Little Help needed to walk in  hospital room?: A Lot Help needed climbing 3-5 steps with a railing? : Total 6 Click Score: 15    End of Session   Activity Tolerance: Patient limited by fatigue Patient left: in chair;with chair alarm set;with call bell/phone within reach Nurse Communication: Mobility status PT Visit Diagnosis: Muscle weakness (generalized) (M62.81);Unsteadiness on feet (R26.81)     Time: 8864-8848 PT Time Calculation (min) (ACUTE ONLY): 16 min  Charges:    $Therapeutic Activity: 8-22 mins PT General Charges $$ ACUTE PT VISIT: 1 Visit                     [name], PT, MPT    [name] 04/15/2024, 1:45 PM

## 2024-04-15 NOTE — Progress Notes (Signed)
  PROGRESS NOTE    Leslie Parker  FMW:[id number] DOB: [birth year] DOA: 04/13/2024 PCP: Sherial Bail, MD  135A/135A-AA  LOS: 2 days   Brief hospital course:   Assessment & Plan: Leslie Parker is a 89 y.o. female with medical history significant of chronic HFrEF with LVEF 25-30%, SSS s/p PPM, PAF on Eliquis , HTN, HLD, IIDM, COPD, CKD stage IV, osteopenia, ileus, presented with generalized weakness.    Acute on chronic ambulation impairment: w/ generalized weakness.  --PT/OT --discharge back to SNF rehab   Acute on chronic combined CHF exacerbation:  --all past BNP >4,500.  CXR showed mild fluid, but pt sating well on room air with no respiratory complaints. --d/c IV lasix  given Cr increase   Complicated UTI, ruled out Chronic bacterial colonization --UA and urine cx appeared to have been collected from old Foley.  Pt had no urinary symptoms, or signs of infection. --does not need to tx with abx   Urinary retention  --presented with Foley (>20 days) --remove Foley and perform voiding trial --if post-void >400 ml, then re-insert Foley   Hyponatremia: labile.  --monitor   HTN:  --reduce coreg  --d/c hydralazine  due to intermittent low BP   Chronic A-fib:  --cont amiodarone  and Eliquis    CKDIV: Cr is labile.  --d/c IV lasix    Sick euthyroid syndrome:  continue on home dose of levothyroxine     DVT prophylaxis: On:Eliquis  Code Status: DNR  Family Communication:  Level of care: Telemetry Medical Dispo:   The patient is from: SNF rehab Anticipated d/c is to: SNF rehab Anticipated d/c date is: tomorrow   Subjective and Interval History:  Pt denied dysuria or bladder pain.  Remove Foley today for voiding trial.     Objective: Vitals:   04/15/24 0840 04/15/24 1631 04/15/24 1802 04/15/24 2041  BP: (!) 129/55 (!) 133/52 (!) 131/59 (!) 93/50  Pulse: 64 60 (!) 59 (!) 59  Resp: 17 17  17   Temp: 98.2 F (36.8 C) 98.9 F (37.2 C)  99.6 F (37.6 C)  TempSrc:  Oral     SpO2: 98% 100%  99%  Weight:      Height:        Intake/Output Summary (Last 24 hours) at 04/15/2024 2116 Last data filed at 04/15/2024 2000 Gross per 24 hour  Intake 605 ml  Output 1100 ml  Net -495 ml   Filed Weights   04/13/24 0258 04/14/24 0500 04/15/24 0500  Weight: 72.1 kg 65.9 kg 66 kg    Examination:   Constitutional: NAD, AAOx3 HEENT: conjunctivae and lids normal, EOMI CV: No cyanosis.   RESP: normal respiratory effort, on RA Neuro: II - XII grossly intact.   Psych: Normal mood and affect.  Appropriate judgement and reason   Data Reviewed: I have personally reviewed labs and imaging studies  Time spent: 50 minutes  Ellouise Haber, MD [facility] If 7PM-7AM, please contact night-coverage 04/15/2024, 9:16 PM

## 2024-04-15 NOTE — Progress Notes (Signed)
 Mobility Specialist Progress Note:    04/15/24 0830  Mobility  Activity Refused and notified nurse if applicable   Pt refused mobility, preferred to eat breakfast in bed. Will attempt at a later time, all needs met.  Sherrilee Ditty Mobility Specialist Please contact via Special educational needs teacher or  Rehab office at [phone]

## 2024-04-15 NOTE — Evaluation (Signed)
 Occupational Therapy Evaluation Patient Details Name: Leslie Parker MRN: [id number] DOB: 06-19-1933 Today's Date: 04/15/2024   History of Present Illness   Patient is a 89 year old female with generalized weakness. PMH: HFrEF with LVEF 25-30%, SSS s/p PPM, PAF on Eliquis , HTN, HLD, IIDM, COPD, CKD stage IV, osteopenia, ileus.    Clinical Impressions [name] was seen for OT evaluation this date. Prior to hospital admission, pt was recently at [facility] requiring assist for w/c t/fs. Pt currently requires MIN A bed mobility. SETUP + SUPERVISION seated grooming tasks. MIN A + RW sit<>stand, does not achieve upright posture citing nausea and initiates return to bed. Pt would benefit from skilled OT to address noted impairments and functional limitations (see below for any additional details). Upon hospital discharge, recommend OT follow up <3 hours/day.     If plan is discharge home, recommend the following:   A little help with walking and/or transfers;A little help with bathing/dressing/bathroom;Assist for transportation;Help with stairs or ramp for entrance;Assistance with cooking/housework     Functional Status Assessment   Patient has had a recent decline in their functional status and demonstrates the ability to make significant improvements in function in a reasonable and predictable amount of time.     Equipment Recommendations   BSC/3in1     Recommendations for Other Services         Precautions/Restrictions   Precautions Precautions: Fall Recall of Precautions/Restrictions: Intact Restrictions Weight Bearing Restrictions Per Provider Order: No     Mobility Bed Mobility Overal bed mobility: Needs Assistance Bed Mobility: Supine to Sit, Sit to Supine     Supine to sit: Min assist Sit to supine: Min assist        Transfers Overall transfer level: Needs assistance Equipment used: Rolling walker (2 wheels) Transfers: Sit to/from Stand Sit to Stand: Min  assist                  Balance Overall balance assessment: Needs assistance Sitting-balance support: Feet supported, Bilateral upper extremity supported Sitting balance-[name]: Good     Standing balance support: Bilateral upper extremity supported, During functional activity, Reliant on assistive device for balance Standing balance-[name]: Poor                             ADL either performed or assessed with clinical judgement   ADL Overall ADL's : Needs assistance/impaired                                       General ADL Comments: MAX A don B socks sitting. SETUP + SUPERVISION seated grooming tasks     Vision         Perception         Praxis         Pertinent Vitals/Pain Pain Assessment Pain Assessment: Faces Faces Pain Scale: Hurts little more Pain Location: stomach Pain Descriptors / Indicators: Discomfort Pain Intervention(s): Limited activity within patient's tolerance, Repositioned     Extremity/Trunk Assessment Upper Extremity Assessment Upper Extremity Assessment: Overall WFL for tasks assessed   Lower Extremity Assessment Lower Extremity Assessment: Generalized weakness       Communication Communication Communication: No apparent difficulties   Cognition Arousal: Alert Behavior During Therapy: WFL for tasks assessed/performed Cognition: No apparent impairments  Following commands: Intact       Cueing  General Comments   Cueing Techniques: Verbal cues      Exercises     Shoulder Instructions      Home Living Family/patient expects to be discharged to:: Skilled nursing facility                                 Additional Comments: Pt lives in senior living apartment. She sleeps sometimes in lift chair. Recently at [facility]      Prior Functioning/Environment Prior Level of Function : Needs assist             Mobility Comments:  ambulation with staff assistance with rolling walker. uses the wheelchair as primary means of mobility      OT Problem List: Decreased strength;Decreased activity tolerance;Impaired balance (sitting and/or standing)   OT Treatment/Interventions: Self-care/ADL training;Therapeutic exercise;Patient/family education;Energy conservation;Therapeutic activities;DME and/or AE instruction      OT Goals(Current goals can be found in the care plan section)   Acute Rehab OT Goals Patient Stated Goal: to improve pain OT Goal Formulation: With patient Time For Goal Achievement: 04/29/24 Potential to Achieve Goals: Fair ADL Goals Pt Will Perform Grooming: with modified independence;standing Pt Will Perform Lower Body Dressing: with modified independence;sit to/from stand Pt Will Transfer to Toilet: with modified independence;ambulating;regular height toilet   OT Frequency:  Min 2X/week    Co-evaluation              AM-PAC OT 6 Clicks Daily Activity     Outcome Measure Help from another person eating meals?: None Help from another person taking care of personal grooming?: None Help from another person toileting, which includes using toliet, bedpan, or urinal?: A Lot Help from another person bathing (including washing, rinsing, drying)?: A Lot Help from another person to put on and taking off regular upper body clothing?: A Lot Help from another person to put on and taking off regular lower body clothing?: A Lot 6 Click Score: 16   End of Session Equipment Utilized During Treatment: Rolling walker (2 wheels) Nurse Communication: Other (comment) (nausea)  Activity Tolerance:  (limited by nasuea) Patient left: in bed;with call bell/phone within reach;with bed alarm set  OT Visit Diagnosis: Other abnormalities of gait and mobility (R26.89);Muscle weakness (generalized) (M62.81)                Time: 9146-9094 OT Time Calculation (min): 12 min Charges:  OT General Charges $OT Visit:  1 Visit OT Evaluation $OT Eval Low Complexity: 1 Low  Elston Slot, M.S. OTR/L  04/15/24, 9:44 AM  ascom [phone]

## 2024-04-16 DIAGNOSIS — N185 Chronic kidney disease, stage 5: Secondary | ICD-10-CM | POA: Diagnosis not present

## 2024-04-16 DIAGNOSIS — I4891 Unspecified atrial fibrillation: Secondary | ICD-10-CM | POA: Diagnosis not present

## 2024-04-16 DIAGNOSIS — R339 Retention of urine, unspecified: Secondary | ICD-10-CM | POA: Diagnosis not present

## 2024-04-16 DIAGNOSIS — M6281 Muscle weakness (generalized): Secondary | ICD-10-CM | POA: Diagnosis not present

## 2024-04-16 DIAGNOSIS — I509 Heart failure, unspecified: Secondary | ICD-10-CM | POA: Diagnosis not present

## 2024-04-16 DIAGNOSIS — Z9689 Presence of other specified functional implants: Secondary | ICD-10-CM | POA: Diagnosis not present

## 2024-04-16 DIAGNOSIS — Z7401 Bed confinement status: Secondary | ICD-10-CM | POA: Diagnosis not present

## 2024-04-16 DIAGNOSIS — J4489 Other specified chronic obstructive pulmonary disease: Secondary | ICD-10-CM | POA: Diagnosis not present

## 2024-04-16 DIAGNOSIS — I5033 Acute on chronic diastolic (congestive) heart failure: Secondary | ICD-10-CM | POA: Diagnosis not present

## 2024-04-16 DIAGNOSIS — Z7901 Long term (current) use of anticoagulants: Secondary | ICD-10-CM | POA: Diagnosis not present

## 2024-04-16 DIAGNOSIS — R0602 Shortness of breath: Secondary | ICD-10-CM | POA: Diagnosis present

## 2024-04-16 DIAGNOSIS — R52 Pain, unspecified: Secondary | ICD-10-CM | POA: Diagnosis not present

## 2024-04-16 DIAGNOSIS — L89620 Pressure ulcer of left heel, unstageable: Secondary | ICD-10-CM | POA: Diagnosis not present

## 2024-04-16 DIAGNOSIS — I340 Nonrheumatic mitral (valve) insufficiency: Secondary | ICD-10-CM | POA: Diagnosis not present

## 2024-04-16 DIAGNOSIS — M79671 Pain in right foot: Secondary | ICD-10-CM | POA: Diagnosis not present

## 2024-04-16 DIAGNOSIS — E1122 Type 2 diabetes mellitus with diabetic chronic kidney disease: Secondary | ICD-10-CM | POA: Diagnosis not present

## 2024-04-16 DIAGNOSIS — I10 Essential (primary) hypertension: Secondary | ICD-10-CM | POA: Diagnosis not present

## 2024-04-16 DIAGNOSIS — Z79899 Other long term (current) drug therapy: Secondary | ICD-10-CM | POA: Diagnosis not present

## 2024-04-16 DIAGNOSIS — J90 Pleural effusion, not elsewhere classified: Secondary | ICD-10-CM | POA: Diagnosis not present

## 2024-04-16 DIAGNOSIS — R531 Weakness: Secondary | ICD-10-CM | POA: Diagnosis not present

## 2024-04-16 DIAGNOSIS — E43 Unspecified severe protein-calorie malnutrition: Secondary | ICD-10-CM | POA: Diagnosis not present

## 2024-04-16 DIAGNOSIS — Z950 Presence of cardiac pacemaker: Secondary | ICD-10-CM | POA: Diagnosis not present

## 2024-04-16 DIAGNOSIS — R1312 Dysphagia, oropharyngeal phase: Secondary | ICD-10-CM | POA: Diagnosis not present

## 2024-04-16 DIAGNOSIS — E039 Hypothyroidism, unspecified: Secondary | ICD-10-CM | POA: Diagnosis not present

## 2024-04-16 DIAGNOSIS — J449 Chronic obstructive pulmonary disease, unspecified: Secondary | ICD-10-CM | POA: Diagnosis not present

## 2024-04-16 DIAGNOSIS — E1129 Type 2 diabetes mellitus with other diabetic kidney complication: Secondary | ICD-10-CM | POA: Diagnosis not present

## 2024-04-16 DIAGNOSIS — J189 Pneumonia, unspecified organism: Secondary | ICD-10-CM | POA: Diagnosis not present

## 2024-04-16 DIAGNOSIS — L89622 Pressure ulcer of left heel, stage 2: Secondary | ICD-10-CM | POA: Diagnosis not present

## 2024-04-16 DIAGNOSIS — R2689 Other abnormalities of gait and mobility: Secondary | ICD-10-CM | POA: Diagnosis not present

## 2024-04-16 DIAGNOSIS — I495 Sick sinus syndrome: Secondary | ICD-10-CM | POA: Diagnosis not present

## 2024-04-16 DIAGNOSIS — N184 Chronic kidney disease, stage 4 (severe): Secondary | ICD-10-CM | POA: Diagnosis not present

## 2024-04-16 DIAGNOSIS — I482 Chronic atrial fibrillation, unspecified: Secondary | ICD-10-CM | POA: Diagnosis not present

## 2024-04-16 DIAGNOSIS — R110 Nausea: Secondary | ICD-10-CM | POA: Diagnosis not present

## 2024-04-16 DIAGNOSIS — M25571 Pain in right ankle and joints of right foot: Secondary | ICD-10-CM | POA: Diagnosis not present

## 2024-04-16 DIAGNOSIS — L89613 Pressure ulcer of right heel, stage 3: Secondary | ICD-10-CM | POA: Diagnosis not present

## 2024-04-16 DIAGNOSIS — M6259 Muscle wasting and atrophy, not elsewhere classified, multiple sites: Secondary | ICD-10-CM | POA: Diagnosis not present

## 2024-04-16 DIAGNOSIS — D631 Anemia in chronic kidney disease: Secondary | ICD-10-CM | POA: Diagnosis not present

## 2024-04-16 DIAGNOSIS — I480 Paroxysmal atrial fibrillation: Secondary | ICD-10-CM | POA: Diagnosis not present

## 2024-04-16 DIAGNOSIS — L89616 Pressure-induced deep tissue damage of right heel: Secondary | ICD-10-CM | POA: Diagnosis not present

## 2024-04-16 DIAGNOSIS — R41841 Cognitive communication deficit: Secondary | ICD-10-CM | POA: Diagnosis not present

## 2024-04-16 DIAGNOSIS — I428 Other cardiomyopathies: Secondary | ICD-10-CM | POA: Diagnosis not present

## 2024-04-16 DIAGNOSIS — R5383 Other fatigue: Secondary | ICD-10-CM | POA: Diagnosis not present

## 2024-04-16 DIAGNOSIS — D649 Anemia, unspecified: Secondary | ICD-10-CM | POA: Diagnosis not present

## 2024-04-16 DIAGNOSIS — I5032 Chronic diastolic (congestive) heart failure: Secondary | ICD-10-CM | POA: Diagnosis not present

## 2024-04-16 DIAGNOSIS — I5043 Acute on chronic combined systolic (congestive) and diastolic (congestive) heart failure: Secondary | ICD-10-CM | POA: Diagnosis not present

## 2024-04-16 DIAGNOSIS — I130 Hypertensive heart and chronic kidney disease with heart failure and stage 1 through stage 4 chronic kidney disease, or unspecified chronic kidney disease: Secondary | ICD-10-CM | POA: Diagnosis not present

## 2024-04-16 DIAGNOSIS — K5900 Constipation, unspecified: Secondary | ICD-10-CM | POA: Diagnosis not present

## 2024-04-16 DIAGNOSIS — E440 Moderate protein-calorie malnutrition: Secondary | ICD-10-CM | POA: Diagnosis not present

## 2024-04-16 DIAGNOSIS — J441 Chronic obstructive pulmonary disease with (acute) exacerbation: Secondary | ICD-10-CM | POA: Diagnosis not present

## 2024-04-16 LAB — BASIC METABOLIC PANEL WITH GFR
Anion gap: 9 (ref 5–15)
BUN: 71 mg/dL — ABNORMAL HIGH (ref 8–23)
CO2: 24 mmol/L (ref 22–32)
Calcium: 7.7 mg/dL — ABNORMAL LOW (ref 8.9–10.3)
Chloride: 100 mmol/L (ref 98–111)
Creatinine, Ser: 3.05 mg/dL — ABNORMAL HIGH (ref 0.44–1.00)
GFR, Estimated: 14 mL/min — ABNORMAL LOW (ref >60)
Glucose, Bld: 101 mg/dL — ABNORMAL HIGH (ref 70–99)
Potassium: 3.9 mmol/L (ref 3.5–5.1)
Sodium: 133 mmol/L — ABNORMAL LOW (ref 135–145)

## 2024-04-16 LAB — GLUCOSE, CAPILLARY: Glucose-Capillary: 116 mg/dL — ABNORMAL HIGH (ref 70–99)

## 2024-04-16 LAB — MAGNESIUM: Magnesium: 1.6 mg/dL — ABNORMAL LOW (ref 1.7–2.4)

## 2024-04-16 MED ORDER — HYDRALAZINE HCL 25 MG PO TABS
25.0000 mg | ORAL_TABLET | ORAL | Status: DC
  Administered 2024-04-16: 25 mg via ORAL
  Filled 2024-04-16: qty 1

## 2024-04-16 MED ORDER — PHENAZOPYRIDINE HCL 100 MG PO TABS: 100.0000 mg | ORAL_TABLET | Freq: Three times a day (TID) | ORAL | Status: AC | PRN

## 2024-04-16 MED ORDER — CARVEDILOL 3.125 MG PO TABS: 3.1250 mg | ORAL_TABLET | Freq: Two times a day (BID) | ORAL | Status: AC

## 2024-04-16 MED ORDER — MAGNESIUM SULFATE 2 GM/50ML IV SOLN
2.0000 g | Freq: Once | INTRAVENOUS | Status: AC
  Administered 2024-04-16: 2 g via INTRAVENOUS
  Filled 2024-04-16: qty 50

## 2024-04-16 MED ORDER — PHENAZOPYRIDINE HCL 100 MG PO TABS
100.0000 mg | ORAL_TABLET | Freq: Once | ORAL | Status: AC
Start: 2024-04-16 — End: 2024-04-16
  Administered 2024-04-16: 100 mg via ORAL
  Filled 2024-04-16: qty 1

## 2024-04-16 MED ORDER — LEVOTHYROXINE SODIUM 25 MCG PO TABS: 50.0000 ug | ORAL_TABLET | Freq: Every day | ORAL | Status: DC

## 2024-04-16 NOTE — TOC Transition Note (Signed)
 Transition of Care [facility]) - Discharge Note   Patient Details  Name: Leslie Parker MRN: [id number] Date of Birth: [birth year]  [facility]) CM/SW Contact:  Alvaro Louder, LCSW Phone Number: 04/16/2024, 10:47 AM   Clinical Narrative:     LCSWA received insurance approval for patient to admit to [facility]. LCSWA confirmed with MD that patient is stable for discharge. LCSWA notified the patient and they are in agreement with discharge. LCSWA confirmed bed is available at SNF. Transport arranged with [facility] for next available.  RM 1B Number to call report is [phone]   Final next level of care: Skilled Nursing Facility Barriers to Discharge: No Barriers Identified   Patient Goals and CMS Choice            Discharge Placement              Patient chooses bed at: [facility] Patient to be transferred to facility by: [facility] Name of family member notified: Self Patient and family notified of of transfer: 04/16/24  Discharge Plan and Services Additional resources added to the After Visit Summary for                                       Social Drivers of Health (SDOH) Interventions SDOH Screenings   Food Insecurity: No Food Insecurity (03/12/2024)  Housing: Low Risk  (03/12/2024)  Transportation Needs: No Transportation Needs (03/12/2024)  Utilities: Not At Risk (03/12/2024)  Alcohol Screen: Low Risk  (08/20/2023)  Financial Resource Strain: Low Risk  (01/13/2024)   Received from [facility]  Social Connections: Moderately Isolated (03/12/2024)  Tobacco Use: Low Risk  (04/13/2024)     Readmission Risk Interventions    10/22/2023    2:28 PM 09/16/2023    2:52 PM 08/20/2023    3:45 PM  Readmission Risk Prevention Plan  Transportation Screening Complete Complete Complete  Medication Review Oceanographer) Complete Complete Complete  PCP or Specialist appointment within 3-5 days of discharge  Complete  Complete  HRI or Home Care Consult Not Complete Patient refused Complete  HRI or Home Care Consult Pt Refusal Comments No PT recs at this time.    SW Recovery Care/Counseling Consult Complete Complete Complete  Palliative Care Screening Not Applicable Not Applicable Not Applicable  Skilled Nursing Facility Not Applicable Not Applicable Not Applicable

## 2024-04-16 NOTE — Care Management Important Message (Signed)
 Important Message  Patient Details  Name: Leslie Parker MRN: [id number] Date of Birth: 06-Oct-1932   Important Message Given:  Yes - Medicare IM     Aalaya Yadao W, CMA 04/16/2024, 10:16 AM

## 2024-04-16 NOTE — Plan of Care (Signed)

## 2024-04-16 NOTE — Plan of Care (Signed)
  Problem: Education: Goal: Knowledge of General Education information will improve Description: Including pain rating scale, medication(s)/side effects and non-pharmacologic comfort measures 04/16/2024 1047 by Joshua Andrez PARAS, LPN Outcome: Adequate for Discharge 04/16/2024 0802 by Joshua Andrez PARAS, LPN Outcome: Progressing   Problem: Health Behavior/Discharge Planning: Goal: Ability to manage health-related needs will improve Outcome: Adequate for Discharge   Problem: Clinical Measurements: Goal: Ability to maintain clinical measurements within normal limits will improve Outcome: Adequate for Discharge Goal: Will remain free from infection Outcome: Adequate for Discharge Goal: Diagnostic test results will improve 04/16/2024 1047 by Joshua Andrez PARAS, LPN Outcome: Adequate for Discharge 04/16/2024 0802 by Joshua Andrez PARAS, LPN Outcome: Progressing Goal: Respiratory complications will improve 04/16/2024 1047 by Joshua Andrez PARAS, LPN Outcome: Adequate for Discharge 04/16/2024 0802 by Joshua Andrez PARAS, LPN Outcome: Progressing Goal: Cardiovascular complication will be avoided Outcome: Adequate for Discharge   Problem: Activity: Goal: Risk for activity intolerance will decrease 04/16/2024 1047 by Joshua Andrez PARAS, LPN Outcome: Adequate for Discharge 04/16/2024 0802 by Joshua Andrez PARAS, LPN Outcome: Progressing   Problem: Nutrition: Goal: Adequate nutrition will be maintained Outcome: Adequate for Discharge   Problem: Coping: Goal: Level of anxiety will decrease Outcome: Adequate for Discharge   Problem: Elimination: Goal: Will not experience complications related to bowel motility Outcome: Adequate for Discharge Goal: Will not experience complications related to urinary retention Outcome: Adequate for Discharge   Problem: Pain Managment: Goal: General experience of comfort will improve and/or be controlled Outcome: Adequate for Discharge   Problem: Safety: Goal: Ability to remain free  from injury will improve Outcome: Adequate for Discharge   Problem: Skin Integrity: Goal: Risk for impaired skin integrity will decrease Outcome: Adequate for Discharge

## 2024-04-16 NOTE — Discharge Summary (Signed)
 Physician Discharge Summary   Leslie Parker  female DOB: [birth year]  FMW:[id number]  PCP: Sherial Bail, MD  Admit date: 04/13/2024 Discharge date: 04/16/2024  Admitted From: SNF rehab Disposition:  SNF rehab CODE STATUS: DNR  Discharge Instructions     No wound care   Complete by: As directed       Hospital Course:  For full details, please see H&P, progress notes, consult notes and ancillary notes.  Briefly,  Leslie Parker is a 89 y.o. female with medical history significant of chronic HFrEF with LVEF 25-30%, SSS s/p PPM, PAF on Eliquis , HTN, IIDM, COPD, CKD stage IV, presented with generalized weakness.    Acute on chronic ambulation impairment Generalized weakness.  --PT/OT --discharge back to SNF rehab   Chronic combined CHF Ruled out acute exacerbation --all past BNP >4,500.  CXR showed mild fluid which is also chronic, and pt sating well on room air with no respiratory complaints. --received IV lasix  20 x4 doses with small Cr increase, IV lasix  then d/c'ed. --home coreg  reduced from 6.25 to 3.125 mg BID due to bradycardia.   Complicated UTI, ruled out Chronic bacterial colonization --Urine cx grew ESBL E coli.  UA and urine cx appeared to have been collected from old Foley.  Pt had no dysuria, urinary symptoms while Foley was in.  No signs of infection.  Pt was started on ceftriaxone  on presentation, based on UA, which was then d/c'ed.  Did not initiate tx for ESBL E coli since it's likely chronic colonization. --of note, pt started complaining of intermittent burning with urination AFTER Foley was taken out.  Maybe due to irritation caused by Foley cath or vaginal atrophy.  Can try Pyridium  PRN for a few days.     Urinary retention  --presented with Foley (>20 days) --remove Foley on 9/24 and perform voiding trial, which pt passed, although pt started complaining of intermittent burning with urination only after Foley was out.  Maybe due to irritation caused by  Foley cath or vaginal atrophy.  Can try Pyridium  PRN for a few days.    Hyponatremia --mild, stable in low 130's.   HTN:  --home coreg  reduced from 6.25 to 3.125 mg BID due to bradycardia. --cont home hydralazine    Chronic A-fib:  --cont amiodarone  and Eliquis    CKDIV   Sick euthyroid syndrome:  continue on home dose of levothyroxine     Discharge Diagnoses:  Principal Problem:   Acute on chronic combined systolic and diastolic CHF (congestive heart failure) (HCC) Active Problems:   Acute on chronic diastolic CHF (congestive heart failure) (HCC)   CHF (congestive heart failure) (HCC)   UTI (urinary tract infection)   30 Day Unplanned Readmission Risk Score    Flowsheet Row ED to Hosp-Admission (Current) from 04/13/2024 in [facility] (1A)  30 Day Unplanned Readmission Risk Score (%) 75.11 Filed at 04/16/2024 0400    This score is the patient's risk of an unplanned readmission within 30 days of being discharged (0 -100%). The score is based on dignosis, age, lab data, medications, orders, and past utilization.   Low:  0-14.9   Medium: 15-21.9   High: 22-29.9   Extreme: 30 and above         Discharge Instructions:  Allergies as of 04/16/2024       Reactions   Baclofen Other (See Comments)   Elevated Cr   Simvastatin Other (See Comments)   Pt denies Elevated LFTs with simva 40mg , stopped and  resolved (see MD note 11/10/13)        Medication List     TAKE these medications    acetaminophen  325 MG tablet Commonly known as: TYLENOL  Take 2 tablets (650 mg total) by mouth every 8 (eight) hours as needed for fever, headache or mild pain (pain score 1-3).   albuterol  108 (90 Base) MCG/ACT inhaler Commonly known as: VENTOLIN  HFA Inhale 2 puffs into the lungs every 6 (six) hours as needed.   alum & mag hydroxide-simeth 200-200-20 MG/5ML suspension Commonly known as: MAALOX/MYLANTA Take 30 mLs by mouth every 6 (six) hours as needed  for indigestion or heartburn.   amiodarone  200 MG tablet Commonly known as: PACERONE  Take 1 tablet by mouth daily.   apixaban  2.5 MG Tabs tablet Commonly known as: ELIQUIS  Take 1 tablet (2.5 mg total) by mouth 2 (two) times daily.   ascorbic acid  250 MG tablet Commonly known as: VITAMIN C  Take 250 mg by mouth daily.   carvedilol  3.125 MG tablet Commonly known as: COREG  Take 1 tablet (3.125 mg total) by mouth 2 (two) times daily with a meal. Reduced from 6.25 mg. What changed:  medication strength how much to take additional instructions   guaiFENesin -dextromethorphan  100-10 MG/5ML syrup Commonly known as: ROBITUSSIN DM Take 10 mLs by mouth every 6 (six) hours as needed for cough.   hydrALAZINE  25 MG tablet Commonly known as: APRESOLINE  25 mg 3 (three) times daily. 0800/1400/2000   levothyroxine  25 MCG tablet Commonly known as: SYNTHROID  Take 2 tablets (50 mcg total) by mouth daily at 6 (six) AM.   melatonin 5 MG Tabs Take 1 tablet (5 mg total) by mouth at bedtime as needed.   multivitamin with minerals Tabs tablet Take 1 tablet by mouth daily. One-A-Day Women's Vitamin   ondansetron  4 MG tablet Commonly known as: ZOFRAN  Take 1 tablet (4 mg total) by mouth every 6 (six) hours as needed for nausea.   pantoprazole  40 MG tablet Commonly known as: PROTONIX  Take 1 tablet (40 mg total) by mouth 2 (two) times daily.   phenazopyridine  100 MG tablet Commonly known as: PYRIDIUM  Take 1 tablet (100 mg total) by mouth 3 (three) times daily as needed for up to 3 days for pain.   polyethylene glycol 17 g packet Commonly known as: MIRALAX  / GLYCOLAX  Take 17 g by mouth daily as needed.   senna-docusate 8.6-50 MG tablet Commonly known as: Senokot-S Take 2 tablets by mouth at bedtime as needed for mild constipation.   Trelegy Ellipta 100-62.5-25 MCG/ACT Aepb Generic drug: Fluticasone -Umeclidin-Vilant Inhale 1 puff into the lungs daily.   Vitamin D -1000 Max St 25 MCG (1000  UT) tablet Generic drug: Cholecalciferol  Take 1,000 Units by mouth daily.         Follow-up Information     [facility]. Go on 04/22/2024.   Specialty: Cardiology Why: Hospital Follow-Up 04/22/2024 @ 2:00 PM [facility] with Ellouise Class, FNP Please bring all  medications to follow-up appointment [facility] [address] Contact information: [address]  [place]  [address] [phone]        Sherial Bail, MD Follow up.   Specialty: Internal Medicine Contact information: [address] [place] [address] [phone]                 Allergies  Allergen Reactions   Baclofen Other (See Comments)    Elevated Cr   Simvastatin Other (See Comments)  Pt denies  Elevated LFTs with simva 40mg , stopped and resolved (see MD note 11/10/13)     The results of significant diagnostics from this hospitalization (including imaging, microbiology, ancillary and laboratory) are listed below for reference.   Consultations:   Procedures/Studies: DG Chest 1 View Result Date: 04/13/2024 EXAM: 1 VIEW XRAY OF THE CHEST 04/13/2024 01:07:12 PM COMPARISON: 04/13/2024 CLINICAL HISTORY: CHF (congestive heart failure) (HCC) [address]06. Congestive heart failure. FINDINGS: LINES, TUBES AND DEVICES: Left chest pacemaker in place. LUNGS AND PLEURA: Persistent bibasilar opacities, likely a combination of small bilateral pleural effusions and adjacent atelectasis. Mild perihilar edema. Skin fold over the lateral left hemithorax. HEART AND MEDIASTINUM: Cardiomegaly, unchanged. Aortic atherosclerosis. BONES AND SOFT TISSUES: Right shoulder arthroplasty. Left rotator cuff repair. Patient rotated to the right. IMPRESSION: 1. Persistent bibasilar opacities, likely a combination of small bilateral pleural effusions and adjacent atelectasis. 2. Similar mild pulmonary edema. Electronically signed by:  Rockey Kilts MD 04/13/2024 01:39 PM EDT RP Workstation: [id number]   DG Chest Port 1 View Result Date: 04/13/2024 EXAM: 1 VIEW XRAY OF THE CHEST 04/13/2024 03:23:00 AM COMPARISON: 03/14/2024 CLINICAL HISTORY: Pt arrives from home via [facility]. Per EMS, pt lives at [facility] and was home for the night with daughter. C/O increased weakness/nausea. Recent DX of Covid and Pneumonia. Pt has pacemaker and chronic Foley. HX CHF, COPD. AOx3. FINDINGS: LINES, TUBES AND DEVICES: Left subclavian pacemaker. LUNGS AND PLEURA: Suspected mild perihilar edema. Small bilateral pleural effusions. HEART AND MEDIASTINUM: No acute abnormality of the cardiac and mediastinal silhouettes. BONES AND SOFT TISSUES: Right shoulder arthroplasty. No acute osseous abnormality. VASCULATURE: Thoracic aortic atherosclerosis. IMPRESSION: 1. Suspected mild perihilar edema. 2. Small bilateral pleural effusions. Electronically signed by: Pinkie Pebbles MD 04/13/2024 03:31 AM EDT RP Workstation: [id number]      Labs: BNP (last 3 results) Recent Labs    03/05/24 1610 03/11/24 2304 04/13/24 0256  BNP >4,500.0* >4,500.0* >4,500.0*   Basic Metabolic Panel: Recent Labs  Lab 04/13/24 0256 04/14/24 0323 04/15/24 0430 04/16/24 0206  NA 133* 131* 133* 133*  K 4.0 4.0 4.1 3.9  CL 99 100 102 100  CO2 22 22 23 24   GLUCOSE 112* 83 124* 101*  BUN 61* 61* 70* 71*  CREATININE 2.74* 2.76* 3.06* 3.05*  CALCIUM 7.9* 7.7* 7.8* 7.7*  MG 1.8  --   --  1.6*   Liver Function Tests: No results for input(s): AST, ALT, ALKPHOS, BILITOT, PROT, ALBUMIN  in the last 168 hours. No results for input(s): LIPASE, AMYLASE in the last 168 hours. No results for input(s): AMMONIA in the last 168 hours. CBC: Recent Labs  Lab 04/13/24 0256 04/15/24 0430  WBC 4.7 5.4  HGB 11.4* 10.2*  HCT 34.7* 30.1*  MCV 92.5 91.2  PLT 200 186   Cardiac Enzymes: No results for input(s): CKTOTAL, CKMB, CKMBINDEX, TROPONINI  in the last 168 hours. BNP: Invalid input(s): POCBNP CBG: Recent Labs  Lab 04/14/24 2121 04/15/24 2057 04/16/24 0804  GLUCAP 147* 174* 116*   D-Dimer No results for input(s): DDIMER in the last 72 hours. Hgb A1c No results for input(s): HGBA1C in the last 72 hours. Lipid Profile No results for input(s): CHOL, HDL, LDLCALC, TRIG, CHOLHDL, LDLDIRECT in the last 72 hours. Thyroid  function studies No results for input(s): TSH, T4TOTAL, T3FREE, THYROIDAB in the last 72 hours.  Invalid input(s): FREET3 Anemia work up No results for input(s): VITAMINB12, FOLATE, FERRITIN, TIBC, IRON, RETICCTPCT in the last 72 hours. Urinalysis    Component Value Date/Time  COLORURINE AMBER (A) 04/13/2024 0321   APPEARANCEUR CLOUDY (A) 04/13/2024 0321   APPEARANCEUR Clear 02/19/2013 0254   LABSPEC 1.014 04/13/2024 0321   LABSPEC 1.006 02/19/2013 0254   PHURINE 5.0 04/13/2024 0321   GLUCOSEU NEGATIVE 04/13/2024 0321   GLUCOSEU Negative 02/19/2013 0254   HGBUR NEGATIVE 04/13/2024 0321   BILIRUBINUR NEGATIVE 04/13/2024 0321   BILIRUBINUR Negative 02/19/2013 0254   KETONESUR NEGATIVE 04/13/2024 0321   PROTEINUR 100 (A) 04/13/2024 0321   NITRITE POSITIVE (A) 04/13/2024 0321   LEUKOCYTESUR LARGE (A) 04/13/2024 0321   LEUKOCYTESUR 1+ 02/19/2013 0254   Sepsis Labs Recent Labs  Lab 04/13/24 0256 04/15/24 0430  WBC 4.7 5.4   Microbiology Recent Results (from the past 240 hours)  Resp panel by RT-PCR (RSV, Flu A&B, Covid) Anterior Nasal Swab     Status: None   Collection Time: 04/13/24  2:56 AM   Specimen: Anterior Nasal Swab  Result Value Ref Range Status   SARS Coronavirus 2 by RT PCR NEGATIVE NEGATIVE Final    Comment: (NOTE) SARS-CoV-2 target nucleic acids are NOT DETECTED.  The SARS-CoV-2 RNA is generally detectable in upper respiratory specimens during the acute phase of infection. The lowest concentration of SARS-CoV-2 viral copies this assay  can detect is 138 copies/mL. A negative result does not preclude SARS-Cov-2 infection and should not be used as the sole basis for treatment or other patient management decisions. A negative result may occur with  improper specimen collection/handling, submission of specimen other than nasopharyngeal swab, presence of viral mutation(s) within the areas targeted by this assay, and inadequate number of viral copies(<138 copies/mL). A negative result must be combined with clinical observations, patient history, and epidemiological information. The expected result is Negative.  Fact Sheet for Patients:  BloggerCourse.com  Fact Sheet for Healthcare Providers:  SeriousBroker.it  This test is no t yet approved or cleared by the [place]  FDA and  has been authorized for detection and/or diagnosis of SARS-CoV-2 by FDA under an Emergency Use Authorization (EUA). This EUA will remain  in effect (meaning this test can be used) for the duration of the COVID-19 declaration under Section 564(b)(1) of the Act, 21 U.S.C.section 360bbb-3(b)(1), unless the authorization is terminated  or revoked sooner.       Influenza A by PCR NEGATIVE NEGATIVE Final   Influenza B by PCR NEGATIVE NEGATIVE Final    Comment: (NOTE) The Xpert Xpress SARS-CoV-2/FLU/RSV plus assay is intended as an aid in the diagnosis of influenza from Nasopharyngeal swab specimens and should not be used as a sole basis for treatment. Nasal washings and aspirates are unacceptable for Xpert Xpress SARS-CoV-2/FLU/RSV testing.  Fact Sheet for Patients: BloggerCourse.com  Fact Sheet for Healthcare Providers: SeriousBroker.it  This test is not yet approved or cleared by the [place]  FDA and has been authorized for detection and/or diagnosis of SARS-CoV-2 by FDA under an Emergency Use Authorization (EUA). This EUA will  remain in effect (meaning this test can be used) for the duration of the COVID-19 declaration under Section 564(b)(1) of the Act, 21 U.S.C. section 360bbb-3(b)(1), unless the authorization is terminated or revoked.     Resp Syncytial Virus by PCR NEGATIVE NEGATIVE Final    Comment: (NOTE) Fact Sheet for Patients: BloggerCourse.com  Fact Sheet for Healthcare Providers: SeriousBroker.it  This test is not yet approved or cleared by the [place]  FDA and has been authorized for detection and/or diagnosis of SARS-CoV-2 by FDA under an Emergency Use Authorization (EUA). This EUA will remain in  effect (meaning this test can be used) for the duration of the COVID-19 declaration under Section 564(b)(1) of the Act, 21 U.S.C. section 360bbb-3(b)(1), unless the authorization is terminated or revoked.  Performed at [facility], [address], [place], [place] [address]   Urine Culture     Status: Abnormal   Collection Time: 04/13/24  3:21 AM   Specimen: Urine, Clean Catch  Result Value Ref Range Status   Specimen Description   Final    URINE, CLEAN CATCH Performed at [facility], [address] [place], [place] [address]    Special Requests   Final    NONE Performed at [facility], [address] [place], [place] [address]    Culture (A)  Final    80,000 COLONIES/mL ESCHERICHIA COLI Confirmed Extended Spectrum Beta-Lactamase Producer (ESBL).  In bloodstream infections from ESBL organisms, carbapenems are preferred over piperacillin /tazobactam. They are shown to have a lower risk of mortality.    Report Status 04/15/2024 FINAL  Final   Organism ID, Bacteria ESCHERICHIA COLI (A)  Final      Susceptibility   Escherichia coli - MIC*    AMPICILLIN  >=32 RESISTANT Resistant     CEFAZOLIN  (URINE) Value in next row Resistant      >=32 RESISTANTThis is a modified FDA-approved test that has been validated  and its performance characteristics determined by the reporting laboratory.  This laboratory is certified under the Clinical Laboratory Improvement Amendments CLIA as qualified to perform high complexity clinical laboratory testing.    CEFEPIME  Value in next row Resistant      >=32 RESISTANTThis is a modified FDA-approved test that has been validated and its performance characteristics determined by the reporting laboratory.  This laboratory is certified under the Clinical Laboratory Improvement Amendments CLIA as qualified to perform high complexity clinical laboratory testing.    ERTAPENEM Value in next row Sensitive      >=32 RESISTANTThis is a modified FDA-approved test that has been validated and its performance characteristics determined by the reporting laboratory.  This laboratory is certified under the Clinical Laboratory Improvement Amendments CLIA as qualified to perform high complexity clinical laboratory testing.    CEFTRIAXONE  Value in next row Resistant      >=32 RESISTANTThis is a modified FDA-approved test that has been validated and its performance characteristics determined by the reporting laboratory.  This laboratory is certified under the Clinical Laboratory Improvement Amendments CLIA as qualified to perform high complexity clinical laboratory testing.    CIPROFLOXACIN Value in next row Resistant      >=32 RESISTANTThis is a modified FDA-approved test that has been validated and its performance characteristics determined by the reporting laboratory.  This laboratory is certified under the Clinical Laboratory Improvement Amendments CLIA as qualified to perform high complexity clinical laboratory testing.    GENTAMICIN  Value in next row Sensitive      >=32 RESISTANTThis is a modified FDA-approved test that has been validated and its performance characteristics determined by the reporting laboratory.  This laboratory is certified under the Clinical Laboratory Improvement Amendments CLIA  as qualified to perform high complexity clinical laboratory testing.    NITROFURANTOIN Value in next row Sensitive      >=32 RESISTANTThis is a modified FDA-approved test that has been validated and its performance characteristics determined by the reporting laboratory.  This laboratory is certified under the Clinical Laboratory Improvement Amendments CLIA as qualified to perform high complexity clinical laboratory testing.    TRIMETH/SULFA Value in next row  Resistant      >=32 RESISTANTThis is a modified FDA-approved test that has been validated and its performance characteristics determined by the reporting laboratory.  This laboratory is certified under the Clinical Laboratory Improvement Amendments CLIA as qualified to perform high complexity clinical laboratory testing.    AMPICILLIN /SULBACTAM Value in next row Resistant      >=32 RESISTANTThis is a modified FDA-approved test that has been validated and its performance characteristics determined by the reporting laboratory.  This laboratory is certified under the Clinical Laboratory Improvement Amendments CLIA as qualified to perform high complexity clinical laboratory testing.    PIP/TAZO Value in next row Intermediate      64 INTERMEDIATEThis is a modified FDA-approved test that has been validated and its performance characteristics determined by the reporting laboratory.  This laboratory is certified under the Clinical Laboratory Improvement Amendments CLIA as qualified to perform high complexity clinical laboratory testing.    MEROPENEM Value in next row Sensitive      64 INTERMEDIATEThis is a modified FDA-approved test that has been validated and its performance characteristics determined by the reporting laboratory.  This laboratory is certified under the Clinical Laboratory Improvement Amendments CLIA as qualified to perform high complexity clinical laboratory testing.    * 80,000 COLONIES/mL ESCHERICHIA COLI     Total time spend on  discharging this patient, including the last patient exam, discussing the hospital stay, instructions for ongoing care as it relates to all pertinent caregivers, as well as preparing the medical discharge records, prescriptions, and/or referrals as applicable, is 35 minutes.    Ellouise Haber, MD  [facility] 04/16/2024, 9:30 AM

## 2024-04-18 DIAGNOSIS — N184 Chronic kidney disease, stage 4 (severe): Secondary | ICD-10-CM | POA: Diagnosis not present

## 2024-04-18 DIAGNOSIS — E440 Moderate protein-calorie malnutrition: Secondary | ICD-10-CM | POA: Diagnosis not present

## 2024-04-18 DIAGNOSIS — I509 Heart failure, unspecified: Secondary | ICD-10-CM | POA: Diagnosis not present

## 2024-04-18 DIAGNOSIS — I10 Essential (primary) hypertension: Secondary | ICD-10-CM | POA: Diagnosis not present

## 2024-04-18 DIAGNOSIS — J189 Pneumonia, unspecified organism: Secondary | ICD-10-CM | POA: Diagnosis not present

## 2024-04-18 DIAGNOSIS — L89620 Pressure ulcer of left heel, unstageable: Secondary | ICD-10-CM | POA: Diagnosis not present

## 2024-04-18 DIAGNOSIS — I4891 Unspecified atrial fibrillation: Secondary | ICD-10-CM | POA: Diagnosis not present

## 2024-04-18 DIAGNOSIS — K5900 Constipation, unspecified: Secondary | ICD-10-CM | POA: Diagnosis not present

## 2024-04-20 DIAGNOSIS — R41841 Cognitive communication deficit: Secondary | ICD-10-CM | POA: Diagnosis not present

## 2024-04-20 DIAGNOSIS — I5043 Acute on chronic combined systolic (congestive) and diastolic (congestive) heart failure: Secondary | ICD-10-CM | POA: Diagnosis not present

## 2024-04-20 DIAGNOSIS — E1122 Type 2 diabetes mellitus with diabetic chronic kidney disease: Secondary | ICD-10-CM | POA: Diagnosis not present

## 2024-04-20 DIAGNOSIS — R1312 Dysphagia, oropharyngeal phase: Secondary | ICD-10-CM | POA: Diagnosis not present

## 2024-04-20 DIAGNOSIS — E039 Hypothyroidism, unspecified: Secondary | ICD-10-CM | POA: Diagnosis not present

## 2024-04-20 DIAGNOSIS — D649 Anemia, unspecified: Secondary | ICD-10-CM | POA: Diagnosis not present

## 2024-04-20 DIAGNOSIS — E43 Unspecified severe protein-calorie malnutrition: Secondary | ICD-10-CM | POA: Diagnosis not present

## 2024-04-20 DIAGNOSIS — J441 Chronic obstructive pulmonary disease with (acute) exacerbation: Secondary | ICD-10-CM | POA: Diagnosis not present

## 2024-04-21 ENCOUNTER — Telehealth: Payer: Self-pay | Admitting: Family

## 2024-04-21 DIAGNOSIS — M6281 Muscle weakness (generalized): Secondary | ICD-10-CM | POA: Diagnosis not present

## 2024-04-21 DIAGNOSIS — I509 Heart failure, unspecified: Secondary | ICD-10-CM | POA: Diagnosis not present

## 2024-04-21 DIAGNOSIS — N184 Chronic kidney disease, stage 4 (severe): Secondary | ICD-10-CM | POA: Diagnosis not present

## 2024-04-21 DIAGNOSIS — J449 Chronic obstructive pulmonary disease, unspecified: Secondary | ICD-10-CM | POA: Diagnosis not present

## 2024-04-21 NOTE — Telephone Encounter (Signed)
 Called to confirm/remind patient of their appointment at [facility] on 04/21/24.   Appointment:   [] Confirmed  [x] Left mess   [] No answer/No voice mail  [] VM Full/unable to leave message  [] Phone not in service  Patient reminded to bring all medications and/or complete list.  Confirmed patient has transportation. Gave directions, instructed to utilize valet parking.

## 2024-04-21 NOTE — Progress Notes (Unsigned)
 Advanced Heart Failure Clinic Note    PCP: Sherial Bail, MD  Cardiologist: Florencio Kava, MD (last seen 07/25; returns 01/26)  Chief Complaint: shortness of breath   HPI:  Leslie Parker is a 89 y/o female with a history of COPD stage IV, PAF, HTN, DM, CKD, hypomagnesemia/ hypokalemia, hyponatremia, pacemaker due to SSS (04/24), asthma & chronic heart failure.   Echo 06/06/22: EF 60-65% with mild LVH, mild LAE, mild MR, mild/ moderate TR, mild Leslie Echo 02/13/23: EF 60-65% with moderate LVH, Grade I DD, mild MR  Admitted 03/20/23 due to shortness of breath, weakness and pedal edema. Chest x-ray done today shows retrocardiac strandy opacities like atelectasis. IV lasix  given. Completed course of antibiotics for pneumonia. CXR showed multifocal nodular opacity in the left lower lobe, possibly endobronchial. The largest nodule measures 11 mm. 80-month follow-up recommended to ensure resolution.   Admitted 04/30/23 due to shortness of breath and found to by hypoxic. Started on IV solumedrol and given IV magnesium  for Mg of 1.2. Lasix  had to be held due to rising creatinine. IVF given for continuing rise in creatinine. Renal ultrasound showing multiple bilateral renal cysts. Every other day furosemide  resumed.   Admitted 06/04/23 due to increased work of breathing over the past 2 to 3 days. Positive orthopnea and PND. Placed on bipap. BNP 1530. Cardiology consulted. IV lasix  given. Weaned off bipap and on to 3L oxygen & then weaned down to 1L  Admitted 06/16/23 due to worsening shortness of breath. Found to have elevated BNP. IV diuresed. Did not meet criteria for home oxygen after walk test completed. Metoprolol  stopped & carvedilol  started. Elevated troponin thought to be due to demand ischemia.  Echo 08/09/23: EF 25-30%, mild LVH, normal RV, moderate LAE, moderate MR  Seen last [facility] OV 12/24, patient has been admitted >12 times with majority of admissions being respiratory failure/ HF  exacerbation.   Admitted 01/20/24 with shortness of breath along with orthopnea. EMS found her hypoxic in low 80s. She previously declined hospice. On presentation's vitals stable on 2 L of oxygen, labs with increasing creatinine to 2.24 with baseline seems to be around 1.9-2.0, hemoglobin of 10.8 which is around her baseline, BNP more than 4500 which she had it for the past many months, troponin 36, VBG with CO2 of 40 otherwise normal. Chest x-ray with cardiomegaly and vascular congestion. Echo 01/21/24: EF 25-30%, mild LVH, G2DD, normal RV, mild MR. IV diuresed. Palliative consulted again. IV diuretic transitioned to oral diuretics. Remains on oxygen.   Admitted 02/25/24 with nonradiating chest pain while in bed the night prior. In the ED she was hemodynamically stable on 2 L nasal cannula, BNP chronically elevated above 4500, troponin slightly elevated but flat which appears consistent with baseline. CXR revealed bilateral vascular congestion and effusions. IV diuresed. Nephrology consulted due to worsening renal function. Developed sudden abdominal pain and bloody diarrhea. Hg stable. Declined GI consults/ colonoscopy. Tropinins elevated and flat. Weaned to room air. Not a dialysis candidate due to age/ comorbidities. Palliative care consulted.   Admitted 03/11/24 with SOB. Chest x-ray showed cardiomegaly with vascular congestion, small amount of bilateral pleural effusion. BNP more than 4000. IV diuresed. Given steroids for bronchospasm. Tested + covid. Antibiotics given. Albumin  given 03/14/24. Discharged to SNF.   Admitted 04/13/24 with generalized weakness. CXR with fluid, chronically elevated BNP. IV lasix  given then stopped due to creatinine rise. Foley catheter removed as it had been in >20 days. Passed voiding trial. Carvedilol  reduced due to bradycardia.  She presents today, with her daughter, for a post-hospital visit with a chief complaint of shortness of breath. Has associated fatigue & minimal  pedal edema. Sleeping well. Appetite good. Currently at [facility] and is getting PT. Not being weighed daily.   ROS: All systems negative except as listed in HPI, PMH and Problem List.  SH:  Social History   Socioeconomic History   Marital status: Divorced    Spouse name: Not on file   Number of children: 4   Years of education: Not on file   Highest education level: 12th grade  Occupational History   Occupation: Retitred/Disability  Tobacco Use   Smoking status: Never   Smokeless tobacco: Never  Vaping Use   Vaping status: Never Used  Substance and Sexual Activity   Alcohol use: No    Alcohol/week: 0.0 standard drinks of alcohol   Drug use: No   Sexual activity: Not Currently  Other Topics Concern   Not on file  Social History Narrative   Not on file   Social Drivers of Health   Financial Resource Strain: Low Risk  (01/13/2024)   Received from [facility]   Overall Financial Resource Strain (CARDIA)    Difficulty of Paying Living Expenses: Not hard at all  Food Insecurity: No Food Insecurity (03/12/2024)   Hunger Vital Sign    Worried About Running Out of Food in the Last Year: Never true    Ran Out of Food in the Last Year: Never true  Transportation Needs: No Transportation Needs (03/12/2024)   PRAPARE - Administrator, Civil Service (Medical): No    Lack of Transportation (Non-Medical): No  Physical Activity: Not on file  Stress: Not on file  Social Connections: Moderately Isolated (03/12/2024)   Social Connection and Isolation Panel    Frequency of Communication with Friends and Family: More than three times a week    Frequency of Social Gatherings with Friends and Family: Three times a week    Attends Religious Services: 1 to 4 times per year    Active Member of Clubs or Organizations: No    Attends Banker Meetings: Never    Marital Status: Divorced  Catering manager Violence: Not At Risk (04/14/2024)    Humiliation, Afraid, Rape, and Kick questionnaire    Fear of Current or Ex-Partner: No    Emotionally Abused: No    Physically Abused: No    Sexually Abused: No    FH:  Family History  Problem Relation Age of Onset   Heart failure Mother    Hypertension Mother    Emphysema Father     Past Medical History:  Diagnosis Date   Acute kidney injury superimposed on CKD 11/24/2022   Asthma    Atrial fibrillation with RVR (HCC)    Cancer (HCC)    Basal Cell   CHF (congestive heart failure) (HCC)    Closed left hip fracture (HCC) 01/25/2017   Closed right hip fracture (HCC) 02/13/2023   Diabetes mellitus without complication (HCC)    Femur fracture, left (HCC) 02/03/2015   Heart murmur    Hypertension    Impetigo    Osteoarthritis    Osteopenia    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   Rapid atrial fibrillation, new onset(HCC) 06/05/2022   Vomiting    can not due to surgery   Wears dentures    full upper and lower    Current Outpatient Medications  Medication Sig Dispense Refill  acetaminophen  (TYLENOL ) 325 MG tablet Take 2 tablets (650 mg total) by mouth every 8 (eight) hours as needed for fever, headache or mild pain (pain score 1-3).     albuterol  (VENTOLIN  HFA) 108 (90 Base) MCG/ACT inhaler Inhale 2 puffs into the lungs every 6 (six) hours as needed.     alum & mag hydroxide-simeth (MAALOX/MYLANTA) 200-200-20 MG/5ML suspension Take 30 mLs by mouth every 6 (six) hours as needed for indigestion or heartburn.     amiodarone  (PACERONE ) 200 MG tablet Take 1 tablet by mouth daily.     apixaban  (ELIQUIS ) 2.5 MG TABS tablet Take 1 tablet (2.5 mg total) by mouth 2 (two) times daily. 60 tablet 1   ascorbic acid  (VITAMIN C ) 250 MG tablet Take 250 mg by mouth daily.     carvedilol  (COREG ) 3.125 MG tablet Take 1 tablet (3.125 mg total) by mouth 2 (two) times daily with a meal. Reduced from 6.25 mg.     Cholecalciferol  (VITAMIN D -1000 MAX ST) 25 MCG (1000 UT) tablet Take 1,000 Units by  mouth daily.     guaiFENesin -dextromethorphan  (ROBITUSSIN DM) 100-10 MG/5ML syrup Take 10 mLs by mouth every 6 (six) hours as needed for cough.     hydrALAZINE  (APRESOLINE ) 25 MG tablet 25 mg 3 (three) times daily. 0800/1400/2000     levothyroxine  (SYNTHROID ) 25 MCG tablet Take 2 tablets (50 mcg total) by mouth daily at 6 (six) AM.     melatonin 5 MG TABS Take 1 tablet (5 mg total) by mouth at bedtime as needed.     Multiple Vitamin (MULTIVITAMIN WITH MINERALS) TABS tablet Take 1 tablet by mouth daily. One-A-Day Women's Vitamin     ondansetron  (ZOFRAN ) 4 MG tablet Take 1 tablet (4 mg total) by mouth every 6 (six) hours as needed for nausea. 20 tablet 0   pantoprazole  (PROTONIX ) 40 MG tablet Take 1 tablet (40 mg total) by mouth 2 (two) times daily. 60 tablet 0   polyethylene glycol (MIRALAX  / GLYCOLAX ) 17 g packet Take 17 g by mouth daily as needed.     senna-docusate (SENOKOT-S) 8.6-50 MG tablet Take 2 tablets by mouth at bedtime as needed for mild constipation.     TRELEGY ELLIPTA 100-62.5-25 MCG/ACT AEPB Inhale 1 puff into the lungs daily.     No current facility-administered medications for this visit.   Vitals:   04/22/24 1352  BP: 136/71  Pulse: 62  SpO2: 97%  Weight: 150 lb 12.8 oz (68.4 kg)   Wt Readings from Last 3 Encounters:  04/22/24 150 lb 12.8 oz (68.4 kg)  04/15/24 145 lb 8.1 oz (66 kg)  03/24/24 146 lb 1.6 oz (66.3 kg)   Lab Results  Component Value Date   CREATININE 3.05 (H) 04/16/2024   CREATININE 3.06 (H) 04/15/2024   CREATININE 2.76 (H) 04/14/2024    PHYSICAL EXAM:  General: Frail, elderly appearing female in wheelchair.  Cor: No JVD. Irregular rhythm, normal rate.  Lungs: clear Abdomen: soft, nontender, nondistended. Extremities: trace pedaleedema Neuro:. Affect pleasant    ECG: not done   ASSESSMENT & PLAN:  1: NICM with preserved ejection fraction- - suspect due to AF/ COPD/ ? OSA - NYHA class III - euvolemic today - weight down 4 pounds from  last visit here 6 weeks ago - Echo 06/06/22: EF 60-65% with mild LVH, mild LAE, mild MR, mild/ moderate TR, mild Leslie - Echo 02/13/23: EF 60-65% with moderate LVH, Grade I DD, mild MR - Echo 08/09/23: EF 25-30%, mild LVH, normal  RV, moderate LAE, moderate MR - Echo 01/21/24: EF 25-30%, mild LVH, G2DD, normal RV, mild MR. IV diuresed. - continue carvedilol  3.125mg  BID - torsemide  currently held due to renal function - Jardiance  stopped due to renal function - losartan  currently paused due to renal function - renal function will not allow for MRA - BNP 04/13/24 was >4500.0 (chronically elevated)  2: HTN- - BP 136/71 - saw PCP Deretha) 06/25. Currently seeing PCP at SNF - continue carvedilol , hydralazine  - order written for BMET to be done and results faxed to us   3: Atrial fibrillation- - saw cardiology Philippe) 07/25; returns 01/26 - continue amiodarone  200mg  daily - continue apixaban  2.5mg  BID - continue carvedilol  3.125mg  BID; bradycardic with 6.25mg  dose  4: DM- - A1c 01/29/24 was 7.0%  5: COPD- - saw pulmonology Alica) 07/24 - does not wear oxygen  6: CKD- - needs f/u with nephrology Jil)  - BMET 04/16/24 reviewed: sodium 133, potassium 3.9, creatinine 3.04, GFR 14 - order written for facility to call Dr. Aureliano office and schedule a f/u appt   Return in 1 month, sooner if needed.   I spent 39 minutes reviewing records, interviewing/ examing patient and managing plan/ orders.   Ellouise DELENA Class 04/21/24

## 2024-04-22 ENCOUNTER — Ambulatory Visit: Attending: Family | Admitting: Family

## 2024-04-22 ENCOUNTER — Encounter: Payer: Self-pay | Admitting: Family

## 2024-04-22 VITALS — BP 136/71 | HR 62 | Wt 150.8 lb

## 2024-04-22 DIAGNOSIS — I509 Heart failure, unspecified: Secondary | ICD-10-CM | POA: Insufficient documentation

## 2024-04-22 DIAGNOSIS — I495 Sick sinus syndrome: Secondary | ICD-10-CM | POA: Diagnosis not present

## 2024-04-22 DIAGNOSIS — J441 Chronic obstructive pulmonary disease with (acute) exacerbation: Secondary | ICD-10-CM | POA: Diagnosis not present

## 2024-04-22 DIAGNOSIS — R5383 Other fatigue: Secondary | ICD-10-CM | POA: Diagnosis not present

## 2024-04-22 DIAGNOSIS — R41841 Cognitive communication deficit: Secondary | ICD-10-CM | POA: Diagnosis not present

## 2024-04-22 DIAGNOSIS — I5032 Chronic diastolic (congestive) heart failure: Secondary | ICD-10-CM

## 2024-04-22 DIAGNOSIS — R339 Retention of urine, unspecified: Secondary | ICD-10-CM | POA: Diagnosis not present

## 2024-04-22 DIAGNOSIS — I480 Paroxysmal atrial fibrillation: Secondary | ICD-10-CM | POA: Insufficient documentation

## 2024-04-22 DIAGNOSIS — Z7901 Long term (current) use of anticoagulants: Secondary | ICD-10-CM | POA: Diagnosis not present

## 2024-04-22 DIAGNOSIS — R0602 Shortness of breath: Secondary | ICD-10-CM | POA: Insufficient documentation

## 2024-04-22 DIAGNOSIS — N184 Chronic kidney disease, stage 4 (severe): Secondary | ICD-10-CM | POA: Diagnosis not present

## 2024-04-22 DIAGNOSIS — Z79899 Other long term (current) drug therapy: Secondary | ICD-10-CM | POA: Insufficient documentation

## 2024-04-22 DIAGNOSIS — I482 Chronic atrial fibrillation, unspecified: Secondary | ICD-10-CM

## 2024-04-22 DIAGNOSIS — I4891 Unspecified atrial fibrillation: Secondary | ICD-10-CM | POA: Diagnosis not present

## 2024-04-22 DIAGNOSIS — J449 Chronic obstructive pulmonary disease, unspecified: Secondary | ICD-10-CM

## 2024-04-22 DIAGNOSIS — E1122 Type 2 diabetes mellitus with diabetic chronic kidney disease: Secondary | ICD-10-CM | POA: Diagnosis not present

## 2024-04-22 DIAGNOSIS — I5043 Acute on chronic combined systolic (congestive) and diastolic (congestive) heart failure: Secondary | ICD-10-CM | POA: Diagnosis not present

## 2024-04-22 DIAGNOSIS — I428 Other cardiomyopathies: Secondary | ICD-10-CM | POA: Diagnosis not present

## 2024-04-22 DIAGNOSIS — R531 Weakness: Secondary | ICD-10-CM | POA: Insufficient documentation

## 2024-04-22 DIAGNOSIS — J4489 Other specified chronic obstructive pulmonary disease: Secondary | ICD-10-CM | POA: Diagnosis not present

## 2024-04-22 DIAGNOSIS — J90 Pleural effusion, not elsewhere classified: Secondary | ICD-10-CM | POA: Diagnosis not present

## 2024-04-22 DIAGNOSIS — I340 Nonrheumatic mitral (valve) insufficiency: Secondary | ICD-10-CM | POA: Insufficient documentation

## 2024-04-22 DIAGNOSIS — I10 Essential (primary) hypertension: Secondary | ICD-10-CM | POA: Diagnosis not present

## 2024-04-22 DIAGNOSIS — N185 Chronic kidney disease, stage 5: Secondary | ICD-10-CM

## 2024-04-22 DIAGNOSIS — Z950 Presence of cardiac pacemaker: Secondary | ICD-10-CM | POA: Diagnosis not present

## 2024-04-22 DIAGNOSIS — I130 Hypertensive heart and chronic kidney disease with heart failure and stage 1 through stage 4 chronic kidney disease, or unspecified chronic kidney disease: Secondary | ICD-10-CM | POA: Diagnosis not present

## 2024-04-22 DIAGNOSIS — R1312 Dysphagia, oropharyngeal phase: Secondary | ICD-10-CM | POA: Diagnosis not present

## 2024-04-22 DIAGNOSIS — I5033 Acute on chronic diastolic (congestive) heart failure: Secondary | ICD-10-CM | POA: Diagnosis not present

## 2024-04-22 NOTE — Patient Instructions (Signed)
 It was good to see you today!

## 2024-04-23 DIAGNOSIS — R39198 Other difficulties with micturition: Secondary | ICD-10-CM | POA: Insufficient documentation

## 2024-04-23 DIAGNOSIS — I509 Heart failure, unspecified: Secondary | ICD-10-CM | POA: Diagnosis not present

## 2024-04-23 DIAGNOSIS — N184 Chronic kidney disease, stage 4 (severe): Secondary | ICD-10-CM | POA: Diagnosis not present

## 2024-04-23 DIAGNOSIS — I10 Essential (primary) hypertension: Secondary | ICD-10-CM | POA: Diagnosis not present

## 2024-04-23 DIAGNOSIS — M6281 Muscle weakness (generalized): Secondary | ICD-10-CM | POA: Diagnosis not present

## 2024-04-23 DIAGNOSIS — I4891 Unspecified atrial fibrillation: Secondary | ICD-10-CM | POA: Diagnosis not present

## 2024-04-23 DIAGNOSIS — J449 Chronic obstructive pulmonary disease, unspecified: Secondary | ICD-10-CM | POA: Diagnosis not present

## 2024-04-23 NOTE — Progress Notes (Deleted)
 04/23/24 6:40 AM   Leslie Parker 04/29/1933 [id number]   HPI: 89 y.o. female here for initial evaluation of urinary dysfunction Significant comorbidity with CHF Numerous hospitalizations over the last 12 months (>12+) - for respiratory failure /CHF exacerbation Indwelling catheters often placed for accurate I/Os with diuretic admin  History of COPD, PAF, hypertension, DM2, CKD, chronic heart failure, pacemaker   PMH: Past Medical History:  Diagnosis Date   Acute kidney injury superimposed on CKD 11/24/2022   Asthma    Atrial fibrillation with RVR (HCC)    Cancer (HCC)    Basal Cell   CHF (congestive heart failure) (HCC)    Closed left hip fracture (HCC) 01/25/2017   Closed right hip fracture (HCC) 02/13/2023   Diabetes mellitus without complication (HCC)    Femur fracture, left (HCC) 02/03/2015   Heart murmur    Hypertension    Impetigo    Osteoarthritis    Osteopenia    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   Rapid atrial fibrillation, new onset(HCC) 06/05/2022   Vomiting    can not due to surgery   Wears dentures    full upper and lower    Surgical History: Past Surgical History:  Procedure Laterality Date   ABDOMINAL HYSTERECTOMY     BACK SURGERY     BLADDER SURGERY     mesh   CARPAL TUNNEL RELEASE Bilateral    CATARACT EXTRACTION Right 2017   CATARACT EXTRACTION W/PHACO Left 02/06/2016   Procedure: CATARACT EXTRACTION PHACO AND INTRAOCULAR LENS PLACEMENT (IOC) left eye;  Surgeon: Donzell Arlyce Budd, MD;  Location: [facility];  Service: Ophthalmology;  Laterality: Left;  DIABETIC LEFT Cannot arrive before 9:30   CERVICAL DISC SURGERY     CHOLECYSTECTOMY     EYE SURGERY Bilateral    Cataract Extraction with IOL   FEMUR IM NAIL Left 02/04/2015   Procedure: INTRAMEDULLARY (IM) NAIL FEMORAL;  Surgeon: Franky Cranker, MD;  Location: [facility];  Service: Orthopedics;  Laterality: Left;   HARDWARE REMOVAL Left 01/17/2017   Procedure: HARDWARE  REMOVAL;  Surgeon: Cranker Franky, MD;  Location: [facility];  Service: Orthopedics;  Laterality: Left;   HERNIA REPAIR  2014   esophageal and gastric mesh. patient unable to throw up d/t mesh   HIP ARTHROPLASTY Left 01/26/2017   Procedure: ARTHROPLASTY BIPOLAR HIP (HEMIARTHROPLASTY) removal hardware left hip;  Surgeon: Cleotilde Barrio, MD;  Location: [facility];  Service: Orthopedics;  Laterality: Left;   INTRAMEDULLARY (IM) NAIL INTERTROCHANTERIC Right 02/13/2023   Procedure: INTRAMEDULLARY (IM) NAIL INTERTROCHANTERIC;  Surgeon: Edie Norleen PARAS, MD;  Location: [facility];  Service: Orthopedics;  Laterality: Right;   JOINT REPLACEMENT Bilateral    knees   PACEMAKER IMPLANT N/A 11/15/2022   Procedure: PACEMAKER IMPLANT;  Surgeon: Ammon Blunt, MD;  Location: [facility];  Service: Cardiovascular;  Laterality: N/A;   PACEMAKER IMPLANT N/A 11/28/2022   Procedure: PACEMAKER IMPLANT;  Surgeon: Ammon Blunt, MD;  Location: [facility];  Service: Cardiovascular;  Laterality: N/A;  Lead reposition   REPLACEMENT TOTAL KNEE BILATERAL Bilateral [id number]   SHOULDER ARTHROSCOPY WITH OPEN ROTATOR CUFF REPAIR AND DISTAL CLAVICLE ACROMINECTOMY Left 10/25/2016   Procedure: SHOULDER ARTHROSCOPY WITH OPEN ROTATOR CUFF REPAIR AND DISTAL CLAVICLE ACROMINECTOMY;  Surgeon: Franky Cranker, MD;  Location: [facility];  Service: Orthopedics;  Laterality: Left;   THYROID  SURGERY     goiter removed   TOTAL HIP REVISION Left 12/02/2017   Procedure: TOTAL HIP REVISION;  Surgeon: Leora Lynwood SAUNDERS, MD;  Location: [facility];  Service: Orthopedics;  Laterality: Left;   TOTAL SHOULDER REPLACEMENT Right 2012    Family History: Family History  Problem Relation Age of Onset   Heart failure Mother    Hypertension Mother    Emphysema Father     Social History:  reports that she has never smoked. She has never used smokeless tobacco. She reports that she does not drink alcohol and does not use drugs.       Physical Exam: There were no vitals taken for this visit.   Constitutional:  Alert and oriented, No acute distress. Cardiovascular: No clubbing, cyanosis, or edema. Respiratory: Normal respiratory effort, no increased work of breathing. GI: Nondistended GU: *** Skin: No rashes, bruises or suspicious lesions. Neurologic: Grossly intact, no focal deficits, moving all 4 extremities. Psychiatric: Normal mood and affect.  Laboratory Data: ***   Pertinent Imaging: I have personally viewed and interpreted the ***.    Assessment & Plan:    Urinary dysfunction Assessment & Plan: Prior indwelling catheters for multiple hospitalizations   - CHF exacerbations w/ diuretic therapy        Leslie Skye, MD 04/23/2024  [facility] [address] [place], [place] [address] [phone]

## 2024-04-23 NOTE — Assessment & Plan Note (Deleted)
 Prior indwelling catheters for multiple hospitalizations   - CHF exacerbations w/ diuretic therapy

## 2024-04-27 DIAGNOSIS — E039 Hypothyroidism, unspecified: Secondary | ICD-10-CM | POA: Diagnosis not present

## 2024-04-27 DIAGNOSIS — D649 Anemia, unspecified: Secondary | ICD-10-CM | POA: Diagnosis not present

## 2024-04-27 DIAGNOSIS — M6281 Muscle weakness (generalized): Secondary | ICD-10-CM | POA: Diagnosis not present

## 2024-04-27 DIAGNOSIS — R1312 Dysphagia, oropharyngeal phase: Secondary | ICD-10-CM | POA: Diagnosis not present

## 2024-04-27 DIAGNOSIS — J449 Chronic obstructive pulmonary disease, unspecified: Secondary | ICD-10-CM | POA: Diagnosis not present

## 2024-04-27 DIAGNOSIS — E1122 Type 2 diabetes mellitus with diabetic chronic kidney disease: Secondary | ICD-10-CM | POA: Diagnosis not present

## 2024-04-27 DIAGNOSIS — R41841 Cognitive communication deficit: Secondary | ICD-10-CM | POA: Diagnosis not present

## 2024-04-27 DIAGNOSIS — I5043 Acute on chronic combined systolic (congestive) and diastolic (congestive) heart failure: Secondary | ICD-10-CM | POA: Diagnosis not present

## 2024-04-27 DIAGNOSIS — L89616 Pressure-induced deep tissue damage of right heel: Secondary | ICD-10-CM | POA: Diagnosis not present

## 2024-04-27 DIAGNOSIS — I509 Heart failure, unspecified: Secondary | ICD-10-CM | POA: Diagnosis not present

## 2024-04-27 DIAGNOSIS — J441 Chronic obstructive pulmonary disease with (acute) exacerbation: Secondary | ICD-10-CM | POA: Diagnosis not present

## 2024-04-28 DIAGNOSIS — N184 Chronic kidney disease, stage 4 (severe): Secondary | ICD-10-CM | POA: Diagnosis not present

## 2024-04-28 DIAGNOSIS — J449 Chronic obstructive pulmonary disease, unspecified: Secondary | ICD-10-CM | POA: Diagnosis not present

## 2024-04-28 DIAGNOSIS — I509 Heart failure, unspecified: Secondary | ICD-10-CM | POA: Diagnosis not present

## 2024-04-28 DIAGNOSIS — I4891 Unspecified atrial fibrillation: Secondary | ICD-10-CM | POA: Diagnosis not present

## 2024-04-28 DIAGNOSIS — M6281 Muscle weakness (generalized): Secondary | ICD-10-CM | POA: Diagnosis not present

## 2024-04-29 DIAGNOSIS — J441 Chronic obstructive pulmonary disease with (acute) exacerbation: Secondary | ICD-10-CM | POA: Diagnosis not present

## 2024-04-29 DIAGNOSIS — R1312 Dysphagia, oropharyngeal phase: Secondary | ICD-10-CM | POA: Diagnosis not present

## 2024-04-29 DIAGNOSIS — R41841 Cognitive communication deficit: Secondary | ICD-10-CM | POA: Diagnosis not present

## 2024-04-29 DIAGNOSIS — I5043 Acute on chronic combined systolic (congestive) and diastolic (congestive) heart failure: Secondary | ICD-10-CM | POA: Diagnosis not present

## 2024-04-30 ENCOUNTER — Ambulatory Visit: Admitting: Urology

## 2024-04-30 ENCOUNTER — Ambulatory Visit: Admitting: Physician Assistant

## 2024-04-30 DIAGNOSIS — I5033 Acute on chronic diastolic (congestive) heart failure: Secondary | ICD-10-CM | POA: Diagnosis not present

## 2024-04-30 DIAGNOSIS — I10 Essential (primary) hypertension: Secondary | ICD-10-CM | POA: Diagnosis not present

## 2024-04-30 DIAGNOSIS — J449 Chronic obstructive pulmonary disease, unspecified: Secondary | ICD-10-CM | POA: Diagnosis not present

## 2024-04-30 DIAGNOSIS — N184 Chronic kidney disease, stage 4 (severe): Secondary | ICD-10-CM | POA: Diagnosis not present

## 2024-04-30 DIAGNOSIS — R39198 Other difficulties with micturition: Secondary | ICD-10-CM

## 2024-04-30 DIAGNOSIS — I509 Heart failure, unspecified: Secondary | ICD-10-CM | POA: Diagnosis not present

## 2024-05-04 DIAGNOSIS — J441 Chronic obstructive pulmonary disease with (acute) exacerbation: Secondary | ICD-10-CM | POA: Diagnosis not present

## 2024-05-04 DIAGNOSIS — I10 Essential (primary) hypertension: Secondary | ICD-10-CM | POA: Diagnosis not present

## 2024-05-04 DIAGNOSIS — R41841 Cognitive communication deficit: Secondary | ICD-10-CM | POA: Diagnosis not present

## 2024-05-04 DIAGNOSIS — D631 Anemia in chronic kidney disease: Secondary | ICD-10-CM | POA: Diagnosis not present

## 2024-05-04 DIAGNOSIS — R1312 Dysphagia, oropharyngeal phase: Secondary | ICD-10-CM | POA: Diagnosis not present

## 2024-05-04 DIAGNOSIS — N184 Chronic kidney disease, stage 4 (severe): Secondary | ICD-10-CM | POA: Diagnosis not present

## 2024-05-04 DIAGNOSIS — I5043 Acute on chronic combined systolic (congestive) and diastolic (congestive) heart failure: Secondary | ICD-10-CM | POA: Diagnosis not present

## 2024-05-05 DIAGNOSIS — J449 Chronic obstructive pulmonary disease, unspecified: Secondary | ICD-10-CM | POA: Diagnosis not present

## 2024-05-05 DIAGNOSIS — N184 Chronic kidney disease, stage 4 (severe): Secondary | ICD-10-CM | POA: Diagnosis not present

## 2024-05-05 DIAGNOSIS — E1122 Type 2 diabetes mellitus with diabetic chronic kidney disease: Secondary | ICD-10-CM | POA: Diagnosis not present

## 2024-05-05 DIAGNOSIS — M6281 Muscle weakness (generalized): Secondary | ICD-10-CM | POA: Diagnosis not present

## 2024-05-05 DIAGNOSIS — E039 Hypothyroidism, unspecified: Secondary | ICD-10-CM | POA: Diagnosis not present

## 2024-05-05 DIAGNOSIS — I4891 Unspecified atrial fibrillation: Secondary | ICD-10-CM | POA: Diagnosis not present

## 2024-05-05 DIAGNOSIS — D649 Anemia, unspecified: Secondary | ICD-10-CM | POA: Diagnosis not present

## 2024-05-05 DIAGNOSIS — L89613 Pressure ulcer of right heel, stage 3: Secondary | ICD-10-CM | POA: Diagnosis not present

## 2024-05-06 DIAGNOSIS — R110 Nausea: Secondary | ICD-10-CM | POA: Diagnosis not present

## 2024-05-06 DIAGNOSIS — L89622 Pressure ulcer of left heel, stage 2: Secondary | ICD-10-CM | POA: Diagnosis not present

## 2024-05-06 DIAGNOSIS — M25571 Pain in right ankle and joints of right foot: Secondary | ICD-10-CM | POA: Diagnosis not present

## 2024-05-06 DIAGNOSIS — Z9689 Presence of other specified functional implants: Secondary | ICD-10-CM | POA: Diagnosis not present

## 2024-05-08 DIAGNOSIS — Z9689 Presence of other specified functional implants: Secondary | ICD-10-CM | POA: Diagnosis not present

## 2024-05-08 DIAGNOSIS — M25571 Pain in right ankle and joints of right foot: Secondary | ICD-10-CM | POA: Diagnosis not present

## 2024-05-08 DIAGNOSIS — R110 Nausea: Secondary | ICD-10-CM | POA: Diagnosis not present

## 2024-05-08 DIAGNOSIS — M6281 Muscle weakness (generalized): Secondary | ICD-10-CM | POA: Diagnosis not present

## 2024-05-11 DIAGNOSIS — E1122 Type 2 diabetes mellitus with diabetic chronic kidney disease: Secondary | ICD-10-CM | POA: Diagnosis not present

## 2024-05-11 DIAGNOSIS — E039 Hypothyroidism, unspecified: Secondary | ICD-10-CM | POA: Diagnosis not present

## 2024-05-11 DIAGNOSIS — L89613 Pressure ulcer of right heel, stage 3: Secondary | ICD-10-CM | POA: Diagnosis not present

## 2024-05-11 DIAGNOSIS — D649 Anemia, unspecified: Secondary | ICD-10-CM | POA: Diagnosis not present

## 2024-05-12 DIAGNOSIS — M25571 Pain in right ankle and joints of right foot: Secondary | ICD-10-CM | POA: Diagnosis not present

## 2024-05-12 DIAGNOSIS — L89622 Pressure ulcer of left heel, stage 2: Secondary | ICD-10-CM | POA: Diagnosis not present

## 2024-05-12 DIAGNOSIS — K5900 Constipation, unspecified: Secondary | ICD-10-CM | POA: Diagnosis not present

## 2024-05-12 DIAGNOSIS — R52 Pain, unspecified: Secondary | ICD-10-CM | POA: Diagnosis not present

## 2024-05-13 DIAGNOSIS — R52 Pain, unspecified: Secondary | ICD-10-CM | POA: Diagnosis not present

## 2024-05-13 DIAGNOSIS — N184 Chronic kidney disease, stage 4 (severe): Secondary | ICD-10-CM | POA: Diagnosis not present

## 2024-05-13 DIAGNOSIS — L89622 Pressure ulcer of left heel, stage 2: Secondary | ICD-10-CM | POA: Diagnosis not present

## 2024-05-13 DIAGNOSIS — I509 Heart failure, unspecified: Secondary | ICD-10-CM | POA: Diagnosis not present

## 2024-05-16 DIAGNOSIS — J449 Chronic obstructive pulmonary disease, unspecified: Secondary | ICD-10-CM | POA: Diagnosis not present

## 2024-05-16 DIAGNOSIS — M25571 Pain in right ankle and joints of right foot: Secondary | ICD-10-CM | POA: Diagnosis not present

## 2024-05-16 DIAGNOSIS — N184 Chronic kidney disease, stage 4 (severe): Secondary | ICD-10-CM | POA: Diagnosis not present

## 2024-05-16 DIAGNOSIS — K5900 Constipation, unspecified: Secondary | ICD-10-CM | POA: Diagnosis not present

## 2024-05-16 DIAGNOSIS — I509 Heart failure, unspecified: Secondary | ICD-10-CM | POA: Diagnosis not present

## 2024-05-16 DIAGNOSIS — R52 Pain, unspecified: Secondary | ICD-10-CM | POA: Diagnosis not present

## 2024-05-16 DIAGNOSIS — I10 Essential (primary) hypertension: Secondary | ICD-10-CM | POA: Diagnosis not present

## 2024-05-16 DIAGNOSIS — L89622 Pressure ulcer of left heel, stage 2: Secondary | ICD-10-CM | POA: Diagnosis not present

## 2024-05-17 DIAGNOSIS — R52 Pain, unspecified: Secondary | ICD-10-CM | POA: Diagnosis not present

## 2024-05-17 DIAGNOSIS — I10 Essential (primary) hypertension: Secondary | ICD-10-CM | POA: Diagnosis not present

## 2024-05-17 DIAGNOSIS — N184 Chronic kidney disease, stage 4 (severe): Secondary | ICD-10-CM | POA: Diagnosis not present

## 2024-05-17 DIAGNOSIS — J449 Chronic obstructive pulmonary disease, unspecified: Secondary | ICD-10-CM | POA: Diagnosis not present

## 2024-05-17 DIAGNOSIS — K5900 Constipation, unspecified: Secondary | ICD-10-CM | POA: Diagnosis not present

## 2024-05-17 DIAGNOSIS — I509 Heart failure, unspecified: Secondary | ICD-10-CM | POA: Diagnosis not present

## 2024-05-18 DIAGNOSIS — E039 Hypothyroidism, unspecified: Secondary | ICD-10-CM | POA: Diagnosis not present

## 2024-05-18 DIAGNOSIS — D649 Anemia, unspecified: Secondary | ICD-10-CM | POA: Diagnosis not present

## 2024-05-18 DIAGNOSIS — I130 Hypertensive heart and chronic kidney disease with heart failure and stage 1 through stage 4 chronic kidney disease, or unspecified chronic kidney disease: Secondary | ICD-10-CM | POA: Diagnosis not present

## 2024-05-18 DIAGNOSIS — I509 Heart failure, unspecified: Secondary | ICD-10-CM | POA: Diagnosis not present

## 2024-05-18 DIAGNOSIS — E1122 Type 2 diabetes mellitus with diabetic chronic kidney disease: Secondary | ICD-10-CM | POA: Diagnosis not present

## 2024-05-18 DIAGNOSIS — L89613 Pressure ulcer of right heel, stage 3: Secondary | ICD-10-CM | POA: Diagnosis not present

## 2024-05-18 DIAGNOSIS — N184 Chronic kidney disease, stage 4 (severe): Secondary | ICD-10-CM | POA: Diagnosis not present

## 2024-05-18 DIAGNOSIS — J449 Chronic obstructive pulmonary disease, unspecified: Secondary | ICD-10-CM | POA: Diagnosis not present

## 2024-05-19 NOTE — Telephone Encounter (Signed)
 Patients daughter called in about her labs, jackie reviewed them with her. She said Inform patient's daughter that her kidney GFR came back at 63. Three months ago it was 17. No changes needed at this time. We will continue to monitor. Patient daughter expressed understanding of labs given to him.

## 2024-05-21 ENCOUNTER — Telehealth: Payer: Self-pay

## 2024-05-21 DIAGNOSIS — I130 Hypertensive heart and chronic kidney disease with heart failure and stage 1 through stage 4 chronic kidney disease, or unspecified chronic kidney disease: Secondary | ICD-10-CM | POA: Diagnosis not present

## 2024-05-21 DIAGNOSIS — I5043 Acute on chronic combined systolic (congestive) and diastolic (congestive) heart failure: Secondary | ICD-10-CM | POA: Diagnosis not present

## 2024-05-21 NOTE — Progress Notes (Signed)
 [name], NP form the pt's nursing facility called stating the pt has had an overnight weight gain of 6 lbs. Pt does appear to have worsening edema from when she last evaluated her.  Pt is not currently taking any diuretics.  Spoke with Morene Brownie, MD. He advises pt to take Torsemide  40 MG once . Also advises pt to take Torsemide  40 MG as needed in the future for weight gain or edema.  Leslie [name], NP of Dr. Starla recommendations. She had good understanding and no further questions. States she will place a STAT order for the torsemide  so pt can take later on today upon med arrival.

## 2024-05-22 DIAGNOSIS — I130 Hypertensive heart and chronic kidney disease with heart failure and stage 1 through stage 4 chronic kidney disease, or unspecified chronic kidney disease: Secondary | ICD-10-CM | POA: Diagnosis not present

## 2024-05-22 DIAGNOSIS — N184 Chronic kidney disease, stage 4 (severe): Secondary | ICD-10-CM | POA: Diagnosis not present

## 2024-05-22 DIAGNOSIS — I509 Heart failure, unspecified: Secondary | ICD-10-CM | POA: Diagnosis not present

## 2024-05-23 DIAGNOSIS — N184 Chronic kidney disease, stage 4 (severe): Secondary | ICD-10-CM | POA: Diagnosis not present

## 2024-05-23 DIAGNOSIS — K2100 Gastro-esophageal reflux disease with esophagitis, without bleeding: Secondary | ICD-10-CM | POA: Diagnosis not present

## 2024-05-23 DIAGNOSIS — I509 Heart failure, unspecified: Secondary | ICD-10-CM | POA: Diagnosis not present

## 2024-05-25 DIAGNOSIS — I130 Hypertensive heart and chronic kidney disease with heart failure and stage 1 through stage 4 chronic kidney disease, or unspecified chronic kidney disease: Secondary | ICD-10-CM | POA: Diagnosis not present

## 2024-05-25 DIAGNOSIS — N184 Chronic kidney disease, stage 4 (severe): Secondary | ICD-10-CM | POA: Diagnosis not present

## 2024-05-25 DIAGNOSIS — I509 Heart failure, unspecified: Secondary | ICD-10-CM | POA: Diagnosis not present

## 2024-05-26 DIAGNOSIS — I509 Heart failure, unspecified: Secondary | ICD-10-CM | POA: Diagnosis not present

## 2024-05-26 DIAGNOSIS — I5043 Acute on chronic combined systolic (congestive) and diastolic (congestive) heart failure: Secondary | ICD-10-CM | POA: Diagnosis not present

## 2024-05-26 DIAGNOSIS — N184 Chronic kidney disease, stage 4 (severe): Secondary | ICD-10-CM | POA: Diagnosis not present

## 2024-05-26 DIAGNOSIS — E039 Hypothyroidism, unspecified: Secondary | ICD-10-CM | POA: Diagnosis not present

## 2024-05-26 DIAGNOSIS — I130 Hypertensive heart and chronic kidney disease with heart failure and stage 1 through stage 4 chronic kidney disease, or unspecified chronic kidney disease: Secondary | ICD-10-CM | POA: Diagnosis not present

## 2024-05-27 ENCOUNTER — Encounter: Admitting: Family

## 2024-05-27 DIAGNOSIS — I5043 Acute on chronic combined systolic (congestive) and diastolic (congestive) heart failure: Secondary | ICD-10-CM | POA: Diagnosis not present

## 2024-05-27 DIAGNOSIS — L89613 Pressure ulcer of right heel, stage 3: Secondary | ICD-10-CM | POA: Diagnosis not present

## 2024-05-27 DIAGNOSIS — E1122 Type 2 diabetes mellitus with diabetic chronic kidney disease: Secondary | ICD-10-CM | POA: Diagnosis not present

## 2024-05-27 DIAGNOSIS — E039 Hypothyroidism, unspecified: Secondary | ICD-10-CM | POA: Diagnosis not present

## 2024-05-27 DIAGNOSIS — D649 Anemia, unspecified: Secondary | ICD-10-CM | POA: Diagnosis not present

## 2024-05-27 DIAGNOSIS — I130 Hypertensive heart and chronic kidney disease with heart failure and stage 1 through stage 4 chronic kidney disease, or unspecified chronic kidney disease: Secondary | ICD-10-CM | POA: Diagnosis not present

## 2024-05-27 DIAGNOSIS — N184 Chronic kidney disease, stage 4 (severe): Secondary | ICD-10-CM | POA: Diagnosis not present

## 2024-05-28 DIAGNOSIS — I130 Hypertensive heart and chronic kidney disease with heart failure and stage 1 through stage 4 chronic kidney disease, or unspecified chronic kidney disease: Secondary | ICD-10-CM | POA: Diagnosis not present

## 2024-05-28 DIAGNOSIS — N184 Chronic kidney disease, stage 4 (severe): Secondary | ICD-10-CM | POA: Diagnosis not present

## 2024-05-28 DIAGNOSIS — I5043 Acute on chronic combined systolic (congestive) and diastolic (congestive) heart failure: Secondary | ICD-10-CM | POA: Diagnosis not present

## 2024-05-30 DIAGNOSIS — N184 Chronic kidney disease, stage 4 (severe): Secondary | ICD-10-CM | POA: Diagnosis not present

## 2024-05-30 DIAGNOSIS — I5043 Acute on chronic combined systolic (congestive) and diastolic (congestive) heart failure: Secondary | ICD-10-CM | POA: Diagnosis not present

## 2024-05-30 DIAGNOSIS — J449 Chronic obstructive pulmonary disease, unspecified: Secondary | ICD-10-CM | POA: Diagnosis not present

## 2024-05-30 DIAGNOSIS — I130 Hypertensive heart and chronic kidney disease with heart failure and stage 1 through stage 4 chronic kidney disease, or unspecified chronic kidney disease: Secondary | ICD-10-CM | POA: Diagnosis not present

## 2024-05-30 DIAGNOSIS — R52 Pain, unspecified: Secondary | ICD-10-CM | POA: Diagnosis not present

## 2024-06-01 ENCOUNTER — Other Ambulatory Visit: Payer: Self-pay

## 2024-06-01 ENCOUNTER — Inpatient Hospital Stay
Admission: EM | Admit: 2024-06-01 | Discharge: 2024-06-22 | DRG: 194 | Disposition: E | Source: Skilled Nursing Facility | Attending: Internal Medicine | Admitting: Internal Medicine

## 2024-06-01 ENCOUNTER — Emergency Department

## 2024-06-01 DIAGNOSIS — N179 Acute kidney failure, unspecified: Secondary | ICD-10-CM | POA: Diagnosis present

## 2024-06-01 DIAGNOSIS — E876 Hypokalemia: Secondary | ICD-10-CM | POA: Diagnosis present

## 2024-06-01 DIAGNOSIS — Z96611 Presence of right artificial shoulder joint: Secondary | ICD-10-CM | POA: Diagnosis present

## 2024-06-01 DIAGNOSIS — J440 Chronic obstructive pulmonary disease with acute lower respiratory infection: Secondary | ICD-10-CM | POA: Diagnosis present

## 2024-06-01 DIAGNOSIS — I5032 Chronic diastolic (congestive) heart failure: Secondary | ICD-10-CM | POA: Diagnosis not present

## 2024-06-01 DIAGNOSIS — Z90710 Acquired absence of both cervix and uterus: Secondary | ICD-10-CM

## 2024-06-01 DIAGNOSIS — Z8249 Family history of ischemic heart disease and other diseases of the circulatory system: Secondary | ICD-10-CM

## 2024-06-01 DIAGNOSIS — Z7951 Long term (current) use of inhaled steroids: Secondary | ICD-10-CM

## 2024-06-01 DIAGNOSIS — Z66 Do not resuscitate: Secondary | ICD-10-CM | POA: Diagnosis not present

## 2024-06-01 DIAGNOSIS — K219 Gastro-esophageal reflux disease without esophagitis: Secondary | ICD-10-CM | POA: Diagnosis present

## 2024-06-01 DIAGNOSIS — K2970 Gastritis, unspecified, without bleeding: Secondary | ICD-10-CM | POA: Diagnosis present

## 2024-06-01 DIAGNOSIS — I480 Paroxysmal atrial fibrillation: Secondary | ICD-10-CM | POA: Diagnosis not present

## 2024-06-01 DIAGNOSIS — J9811 Atelectasis: Secondary | ICD-10-CM | POA: Diagnosis present

## 2024-06-01 DIAGNOSIS — R54 Age-related physical debility: Secondary | ICD-10-CM | POA: Diagnosis present

## 2024-06-01 DIAGNOSIS — Z950 Presence of cardiac pacemaker: Secondary | ICD-10-CM | POA: Diagnosis not present

## 2024-06-01 DIAGNOSIS — Z711 Person with feared health complaint in whom no diagnosis is made: Secondary | ICD-10-CM | POA: Diagnosis not present

## 2024-06-01 DIAGNOSIS — I130 Hypertensive heart and chronic kidney disease with heart failure and stage 1 through stage 4 chronic kidney disease, or unspecified chronic kidney disease: Secondary | ICD-10-CM | POA: Diagnosis present

## 2024-06-01 DIAGNOSIS — I10 Essential (primary) hypertension: Secondary | ICD-10-CM | POA: Diagnosis not present

## 2024-06-01 DIAGNOSIS — R110 Nausea: Secondary | ICD-10-CM | POA: Diagnosis not present

## 2024-06-01 DIAGNOSIS — R627 Adult failure to thrive: Secondary | ICD-10-CM | POA: Diagnosis present

## 2024-06-01 DIAGNOSIS — E785 Hyperlipidemia, unspecified: Secondary | ICD-10-CM | POA: Diagnosis present

## 2024-06-01 DIAGNOSIS — E119 Type 2 diabetes mellitus without complications: Secondary | ICD-10-CM

## 2024-06-01 DIAGNOSIS — B952 Enterococcus as the cause of diseases classified elsewhere: Secondary | ICD-10-CM | POA: Diagnosis present

## 2024-06-01 DIAGNOSIS — Z7901 Long term (current) use of anticoagulants: Secondary | ICD-10-CM

## 2024-06-01 DIAGNOSIS — E872 Acidosis, unspecified: Secondary | ICD-10-CM | POA: Diagnosis present

## 2024-06-01 DIAGNOSIS — K5900 Constipation, unspecified: Secondary | ICD-10-CM | POA: Diagnosis not present

## 2024-06-01 DIAGNOSIS — Z7189 Other specified counseling: Secondary | ICD-10-CM | POA: Diagnosis not present

## 2024-06-01 DIAGNOSIS — E1122 Type 2 diabetes mellitus with diabetic chronic kidney disease: Secondary | ICD-10-CM | POA: Diagnosis not present

## 2024-06-01 DIAGNOSIS — I129 Hypertensive chronic kidney disease with stage 1 through stage 4 chronic kidney disease, or unspecified chronic kidney disease: Secondary | ICD-10-CM | POA: Diagnosis not present

## 2024-06-01 DIAGNOSIS — E871 Hypo-osmolality and hyponatremia: Secondary | ICD-10-CM | POA: Diagnosis not present

## 2024-06-01 DIAGNOSIS — N184 Chronic kidney disease, stage 4 (severe): Secondary | ICD-10-CM | POA: Diagnosis not present

## 2024-06-01 DIAGNOSIS — J90 Pleural effusion, not elsewhere classified: Secondary | ICD-10-CM | POA: Diagnosis not present

## 2024-06-01 DIAGNOSIS — I482 Chronic atrial fibrillation, unspecified: Secondary | ICD-10-CM | POA: Diagnosis not present

## 2024-06-01 DIAGNOSIS — I5084 End stage heart failure: Secondary | ICD-10-CM | POA: Diagnosis not present

## 2024-06-01 DIAGNOSIS — D631 Anemia in chronic kidney disease: Secondary | ICD-10-CM | POA: Diagnosis not present

## 2024-06-01 DIAGNOSIS — I5042 Chronic combined systolic (congestive) and diastolic (congestive) heart failure: Secondary | ICD-10-CM | POA: Diagnosis present

## 2024-06-01 DIAGNOSIS — S7291XS Unspecified fracture of right femur, sequela: Secondary | ICD-10-CM

## 2024-06-01 DIAGNOSIS — I5020 Unspecified systolic (congestive) heart failure: Secondary | ICD-10-CM | POA: Diagnosis not present

## 2024-06-01 DIAGNOSIS — Z6824 Body mass index (BMI) 24.0-24.9, adult: Secondary | ICD-10-CM

## 2024-06-01 DIAGNOSIS — K259 Gastric ulcer, unspecified as acute or chronic, without hemorrhage or perforation: Secondary | ICD-10-CM | POA: Diagnosis present

## 2024-06-01 DIAGNOSIS — E039 Hypothyroidism, unspecified: Secondary | ICD-10-CM | POA: Diagnosis present

## 2024-06-01 DIAGNOSIS — A4181 Sepsis due to Enterococcus: Secondary | ICD-10-CM | POA: Diagnosis not present

## 2024-06-01 DIAGNOSIS — M8580 Other specified disorders of bone density and structure, unspecified site: Secondary | ICD-10-CM | POA: Diagnosis present

## 2024-06-01 DIAGNOSIS — Z515 Encounter for palliative care: Secondary | ICD-10-CM

## 2024-06-01 DIAGNOSIS — R0602 Shortness of breath: Secondary | ICD-10-CM | POA: Diagnosis not present

## 2024-06-01 DIAGNOSIS — R7881 Bacteremia: Secondary | ICD-10-CM | POA: Diagnosis present

## 2024-06-01 DIAGNOSIS — Z79890 Hormone replacement therapy (postmenopausal): Secondary | ICD-10-CM

## 2024-06-01 DIAGNOSIS — I447 Left bundle-branch block, unspecified: Secondary | ICD-10-CM | POA: Diagnosis not present

## 2024-06-01 DIAGNOSIS — Z79899 Other long term (current) drug therapy: Secondary | ICD-10-CM

## 2024-06-01 DIAGNOSIS — Z1152 Encounter for screening for COVID-19: Secondary | ICD-10-CM | POA: Diagnosis not present

## 2024-06-01 DIAGNOSIS — M159 Polyosteoarthritis, unspecified: Secondary | ICD-10-CM | POA: Diagnosis present

## 2024-06-01 DIAGNOSIS — S7292XS Unspecified fracture of left femur, sequela: Secondary | ICD-10-CM

## 2024-06-01 DIAGNOSIS — J449 Chronic obstructive pulmonary disease, unspecified: Secondary | ICD-10-CM | POA: Diagnosis present

## 2024-06-01 DIAGNOSIS — J189 Pneumonia, unspecified organism: Secondary | ICD-10-CM | POA: Diagnosis not present

## 2024-06-01 DIAGNOSIS — Z96653 Presence of artificial knee joint, bilateral: Secondary | ICD-10-CM | POA: Diagnosis present

## 2024-06-01 DIAGNOSIS — R059 Cough, unspecified: Secondary | ICD-10-CM | POA: Diagnosis not present

## 2024-06-01 DIAGNOSIS — Z825 Family history of asthma and other chronic lower respiratory diseases: Secondary | ICD-10-CM

## 2024-06-01 LAB — BRAIN NATRIURETIC PEPTIDE: B Natriuretic Peptide: >4500 pg/mL — ABNORMAL HIGH (ref 0.0–100.0)

## 2024-06-01 LAB — BLOOD CULTURE ID PANEL (REFLEXED) - BCID2
A.calcoaceticus-baumannii: NOT DETECTED
Bacteroides fragilis: NOT DETECTED
Candida albicans: NOT DETECTED
Candida auris: NOT DETECTED
Candida glabrata: NOT DETECTED
Candida krusei: NOT DETECTED
Candida parapsilosis: NOT DETECTED
Candida tropicalis: NOT DETECTED
Cryptococcus neoformans/gattii: NOT DETECTED
Enterobacter cloacae complex: NOT DETECTED
Enterobacterales: NOT DETECTED
Enterococcus Faecium: DETECTED — AB
Enterococcus faecalis: NOT DETECTED
Escherichia coli: NOT DETECTED
Haemophilus influenzae: NOT DETECTED
Klebsiella aerogenes: NOT DETECTED
Klebsiella oxytoca: NOT DETECTED
Klebsiella pneumoniae: NOT DETECTED
Listeria monocytogenes: NOT DETECTED
Neisseria meningitidis: NOT DETECTED
Proteus species: NOT DETECTED
Pseudomonas aeruginosa: NOT DETECTED
Salmonella species: NOT DETECTED
Serratia marcescens: NOT DETECTED
Staphylococcus aureus (BCID): NOT DETECTED
Staphylococcus epidermidis: NOT DETECTED
Staphylococcus lugdunensis: NOT DETECTED
Staphylococcus species: NOT DETECTED
Stenotrophomonas maltophilia: NOT DETECTED
Streptococcus agalactiae: NOT DETECTED
Streptococcus pneumoniae: NOT DETECTED
Streptococcus pyogenes: NOT DETECTED
Streptococcus species: NOT DETECTED
Vancomycin resistance: NOT DETECTED

## 2024-06-01 LAB — CBC WITH DIFFERENTIAL/PLATELET
Abs Immature Granulocytes: 0.07 K/uL (ref 0.00–0.07)
Basophils Absolute: 0 K/uL (ref 0.0–0.1)
Basophils Relative: 1 %
Eosinophils Absolute: 0 K/uL (ref 0.0–0.5)
Eosinophils Relative: 1 %
HCT: 36 % (ref 36.0–46.0)
Hemoglobin: 11.4 g/dL — ABNORMAL LOW (ref 12.0–15.0)
Immature Granulocytes: 1 %
Lymphocytes Relative: 16 %
Lymphs Abs: 1.3 K/uL (ref 0.7–4.0)
MCH: 30 pg (ref 26.0–34.0)
MCHC: 31.7 g/dL (ref 30.0–36.0)
MCV: 94.7 fL (ref 80.0–100.0)
Monocytes Absolute: 0.6 K/uL (ref 0.1–1.0)
Monocytes Relative: 8 %
Neutro Abs: 6.1 K/uL (ref 1.7–7.7)
Neutrophils Relative %: 73 %
Platelets: 248 K/uL (ref 150–400)
RBC: 3.8 MIL/uL — ABNORMAL LOW (ref 3.87–5.11)
RDW: 18.6 % — ABNORMAL HIGH (ref 11.5–15.5)
WBC: 8.2 K/uL (ref 4.0–10.5)
nRBC: 0 % (ref 0.0–0.2)

## 2024-06-01 LAB — TROPONIN I (HIGH SENSITIVITY)
Troponin I (High Sensitivity): 46 ng/L — ABNORMAL HIGH (ref <18)
Troponin I (High Sensitivity): 45 ng/L — ABNORMAL HIGH (ref <18)

## 2024-06-01 LAB — COMPREHENSIVE METABOLIC PANEL WITH GFR
ALT: 42 U/L (ref 0–44)
AST: 51 U/L — ABNORMAL HIGH (ref 15–41)
Albumin: 2.5 g/dL — ABNORMAL LOW (ref 3.5–5.0)
Alkaline Phosphatase: 125 U/L (ref 38–126)
Anion gap: 13 (ref 5–15)
BUN: 55 mg/dL — ABNORMAL HIGH (ref 8–23)
CO2: 20 mmol/L — ABNORMAL LOW (ref 22–32)
Calcium: 8.4 mg/dL — ABNORMAL LOW (ref 8.9–10.3)
Chloride: 104 mmol/L (ref 98–111)
Creatinine, Ser: 2.36 mg/dL — ABNORMAL HIGH (ref 0.44–1.00)
GFR, Estimated: 19 mL/min — ABNORMAL LOW (ref >60)
Glucose, Bld: 118 mg/dL — ABNORMAL HIGH (ref 70–99)
Potassium: 3.1 mmol/L — ABNORMAL LOW (ref 3.5–5.1)
Sodium: 137 mmol/L (ref 135–145)
Total Bilirubin: 1.5 mg/dL — ABNORMAL HIGH (ref 0.0–1.2)
Total Protein: 6.4 g/dL — ABNORMAL LOW (ref 6.5–8.1)

## 2024-06-01 LAB — GLUCOSE, CAPILLARY: Glucose-Capillary: 156 mg/dL — ABNORMAL HIGH (ref 70–99)

## 2024-06-01 LAB — RESP PANEL BY RT-PCR (RSV, FLU A&B, COVID)  RVPGX2
Influenza A by PCR: NEGATIVE
Influenza B by PCR: NEGATIVE
Resp Syncytial Virus by PCR: NEGATIVE
SARS Coronavirus 2 by RT PCR: NEGATIVE

## 2024-06-01 LAB — LACTIC ACID, PLASMA
Lactic Acid, Venous: 1.1 mmol/L (ref 0.5–1.9)
Lactic Acid, Venous: 1 mmol/L (ref 0.5–1.9)

## 2024-06-01 LAB — MAGNESIUM: Magnesium: 1.7 mg/dL (ref 1.7–2.4)

## 2024-06-01 LAB — PROCALCITONIN
Procalcitonin: 0.19 ng/mL
Procalcitonin: 0.18 ng/mL

## 2024-06-01 LAB — CK: Total CK: 30 U/L — ABNORMAL LOW (ref 38–234)

## 2024-06-01 MED ORDER — VANCOMYCIN HCL 1750 MG/350ML IV SOLN
1750.0000 mg | Freq: Once | INTRAVENOUS | Status: AC
  Administered 2024-06-01: 1750 mg via INTRAVENOUS
  Filled 2024-06-01: qty 350

## 2024-06-01 MED ORDER — ACETAMINOPHEN 650 MG RE SUPP: 650.0000 mg | Freq: Four times a day (QID) | RECTAL | Status: DC | PRN

## 2024-06-01 MED ORDER — POLYETHYLENE GLYCOL 3350 17 G PO PACK
17.0000 g | PACK | Freq: Every day | ORAL | Status: DC | PRN
  Administered 2024-06-02 – 2024-06-04 (×4): 17 g via ORAL
  Filled 2024-06-01 (×4): qty 1

## 2024-06-01 MED ORDER — SODIUM CHLORIDE 0.9 % IV SOLN
2.0000 g | INTRAVENOUS | Status: DC
  Administered 2024-06-01 – 2024-06-03 (×3): 2 g via INTRAVENOUS
  Filled 2024-06-01 (×4): qty 20

## 2024-06-01 MED ORDER — MELATONIN 5 MG PO TABS
5.0000 mg | ORAL_TABLET | Freq: Every evening | ORAL | Status: DC | PRN
  Administered 2024-06-03: 5 mg via ORAL
  Filled 2024-06-01: qty 1

## 2024-06-01 MED ORDER — ONDANSETRON HCL 4 MG PO TABS: 4.0000 mg | ORAL_TABLET | Freq: Four times a day (QID) | ORAL | Status: DC | PRN

## 2024-06-01 MED ORDER — ENOXAPARIN SODIUM 40 MG/0.4ML IJ SOSY: 40.0000 mg | PREFILLED_SYRINGE | INTRAMUSCULAR | Status: DC

## 2024-06-01 MED ORDER — ACETAMINOPHEN 325 MG PO TABS
650.0000 mg | ORAL_TABLET | Freq: Four times a day (QID) | ORAL | Status: DC | PRN
Start: 2024-06-01 — End: 2024-06-05
  Administered 2024-06-03 – 2024-06-05 (×3): 650 mg via ORAL
  Filled 2024-06-01 (×3): qty 2

## 2024-06-01 MED ORDER — CARVEDILOL 3.125 MG PO TABS
3.1250 mg | ORAL_TABLET | Freq: Two times a day (BID) | ORAL | Status: DC
  Administered 2024-06-02 – 2024-06-03 (×4): 3.125 mg via ORAL
  Filled 2024-06-01 (×5): qty 1

## 2024-06-01 MED ORDER — LEVOTHYROXINE SODIUM 50 MCG PO TABS
50.0000 ug | ORAL_TABLET | Freq: Every day | ORAL | Status: DC
  Administered 2024-06-02 – 2024-06-05 (×4): 50 ug via ORAL
  Filled 2024-06-01 (×4): qty 1

## 2024-06-01 MED ORDER — PANTOPRAZOLE SODIUM 40 MG PO TBEC
40.0000 mg | DELAYED_RELEASE_TABLET | Freq: Two times a day (BID) | ORAL | Status: DC
  Administered 2024-06-01 – 2024-06-05 (×9): 40 mg via ORAL
  Filled 2024-06-01 (×9): qty 1

## 2024-06-01 MED ORDER — HEPARIN SODIUM (PORCINE) 5000 UNIT/ML IJ SOLN: 5000.0000 [IU] | Freq: Three times a day (TID) | INTRAMUSCULAR | Status: DC

## 2024-06-01 MED ORDER — ACETAMINOPHEN 500 MG PO TABS
1000.0000 mg | ORAL_TABLET | Freq: Once | ORAL | Status: AC
  Administered 2024-06-01: 1000 mg via ORAL
  Filled 2024-06-01: qty 2

## 2024-06-01 MED ORDER — OXYCODONE HCL 5 MG PO TABS
5.0000 mg | ORAL_TABLET | ORAL | Status: DC | PRN
  Administered 2024-06-01 – 2024-06-02 (×3): 5 mg via ORAL
  Filled 2024-06-01 (×3): qty 1

## 2024-06-01 MED ORDER — ONDANSETRON HCL 4 MG/2ML IJ SOLN
4.0000 mg | Freq: Four times a day (QID) | INTRAMUSCULAR | Status: DC | PRN
  Administered 2024-06-01 – 2024-06-05 (×3): 4 mg via INTRAVENOUS
  Filled 2024-06-01 (×3): qty 2

## 2024-06-01 MED ORDER — APIXABAN 2.5 MG PO TABS
2.5000 mg | ORAL_TABLET | Freq: Two times a day (BID) | ORAL | Status: DC
  Administered 2024-06-01 – 2024-06-03 (×5): 2.5 mg via ORAL
  Filled 2024-06-01 (×5): qty 1

## 2024-06-01 MED ORDER — AMIODARONE HCL 200 MG PO TABS
200.0000 mg | ORAL_TABLET | Freq: Every day | ORAL | Status: DC
  Administered 2024-06-01 – 2024-06-03 (×3): 200 mg via ORAL
  Filled 2024-06-01 (×4): qty 1

## 2024-06-01 MED ORDER — SODIUM CHLORIDE 0.9 % IV SOLN
500.0000 mg | INTRAVENOUS | Status: DC
  Administered 2024-06-01 – 2024-06-02 (×2): 500 mg via INTRAVENOUS
  Filled 2024-06-01 (×3): qty 5

## 2024-06-01 MED ORDER — SODIUM CHLORIDE 0.9 % IV SOLN
2.0000 g | Freq: Once | INTRAVENOUS | Status: AC
  Administered 2024-06-01: 2 g via INTRAVENOUS
  Filled 2024-06-01: qty 12.5

## 2024-06-01 NOTE — Progress Notes (Signed)
 OT Cancellation Note  Patient Details Name: Leslie Parker MRN: [id number] DOB: [birth year]   Cancelled Treatment:    Reason Eval/Treat Not Completed: Fatigue/lethargy limiting ability to participate;Patient declined, no reason specified. Upon attempt, pt very lethargic, endorses significant nausea and pain with any movement. Reporting the pain in her chest, R flank, and moves around. Declines therapy this afternoon. Will re-attempt next date.   Keerthana Vanrossum R., MPH, MS, OTR/L ascom [phone] 06/01/24, 3:48 PM

## 2024-06-01 NOTE — Progress Notes (Signed)
 ED Pharmacy Antibiotic Sign Off An antibiotic consult was received from an ED provider for Vancomycin  per pharmacy dosing for sepsis. A chart review was completed to assess appropriateness.   The following one time order(s) were placed:  Vancomycin  1750 mg IV X 1.   Further antibiotic and/or antibiotic pharmacy consults should be ordered by the admitting provider if indicated.   Thank you for allowing pharmacy to be a part of this patient's care.   Silver Selinda BIRCH [facility]  Clinical Pharmacist 06/01/24 7:10 AM

## 2024-06-01 NOTE — ED Triage Notes (Signed)
 BIB [facility] from [facility] with CC of nasal congestion, chills, and body aches x3-4 days.

## 2024-06-01 NOTE — ED Notes (Signed)
 CCMD called to initiate cardiac monitoring.

## 2024-06-01 NOTE — Procedures (Signed)
 PHARMACY - PHYSICIAN COMMUNICATION CRITICAL VALUE ALERT - BLOOD CULTURE IDENTIFICATION (BCID)  Results for orders placed or performed during the hospital encounter of 06/01/24  Resp panel by RT-PCR (RSV, Flu A&B, Covid) Anterior Nasal Swab     Status: None   Collection Time: 06/01/24  6:14 AM   Specimen: Anterior Nasal Swab  Result Value Ref Range Status   SARS Coronavirus 2 by RT PCR NEGATIVE NEGATIVE Final    Comment: (NOTE) SARS-CoV-2 target nucleic acids are NOT DETECTED.  The SARS-CoV-2 RNA is generally detectable in upper respiratory specimens during the acute phase of infection. The lowest concentration of SARS-CoV-2 viral copies this assay can detect is 138 copies/mL. A negative result does not preclude SARS-Cov-2 infection and should not be used as the sole basis for treatment or other patient management decisions. A negative result may occur with  improper specimen collection/handling, submission of specimen other than nasopharyngeal swab, presence of viral mutation(s) within the areas targeted by this assay, and inadequate number of viral copies(<138 copies/mL). A negative result must be combined with clinical observations, patient history, and epidemiological information. The expected result is Negative.  Fact Sheet for Patients:  bloggercourse.com  Fact Sheet for Healthcare Providers:  seriousbroker.it  This test is no t yet approved or cleared by the [place]  FDA and  has been authorized for detection and/or diagnosis of SARS-CoV-2 by FDA under an Emergency Use Authorization (EUA). This EUA will remain  in effect (meaning this test can be used) for the duration of the COVID-19 declaration under Section 564(b)(1) of the Act, 21 U.S.C.section 360bbb-3(b)(1), unless the authorization is terminated  or revoked sooner.       Influenza A by PCR NEGATIVE NEGATIVE Final   Influenza B by PCR NEGATIVE NEGATIVE Final     Comment: (NOTE) The Xpert Xpress SARS-CoV-2/FLU/RSV plus assay is intended as an aid in the diagnosis of influenza from Nasopharyngeal swab specimens and should not be used as a sole basis for treatment. Nasal washings and aspirates are unacceptable for Xpert Xpress SARS-CoV-2/FLU/RSV testing.  Fact Sheet for Patients: bloggercourse.com  Fact Sheet for Healthcare Providers: seriousbroker.it  This test is not yet approved or cleared by the [place]  FDA and has been authorized for detection and/or diagnosis of SARS-CoV-2 by FDA under an Emergency Use Authorization (EUA). This EUA will remain in effect (meaning this test can be used) for the duration of the COVID-19 declaration under Section 564(b)(1) of the Act, 21 U.S.C. section 360bbb-3(b)(1), unless the authorization is terminated or revoked.     Resp Syncytial Virus by PCR NEGATIVE NEGATIVE Final    Comment: (NOTE) Fact Sheet for Patients: bloggercourse.com  Fact Sheet for Healthcare Providers: seriousbroker.it  This test is not yet approved or cleared by the [place]  FDA and has been authorized for detection and/or diagnosis of SARS-CoV-2 by FDA under an Emergency Use Authorization (EUA). This EUA will remain in effect (meaning this test can be used) for the duration of the COVID-19 declaration under Section 564(b)(1) of the Act, 21 U.S.C. section 360bbb-3(b)(1), unless the authorization is terminated or revoked.  Performed at [facility], [address] [place], [place] [address]   Blood culture (single)     Status: None (Preliminary result)   Collection Time: 06/01/24  6:15 AM   Specimen: BLOOD  Result Value Ref Range Status   Specimen Description BLOOD BLOOD RIGHT ARM  Final   Special Requests   Final    BOTTLES DRAWN AEROBIC AND ANAEROBIC Blood  Culture adequate volume   Culture  Setup Time    Final    GRAM POSITIVE COCCI IN BOTH AEROBIC AND ANAEROBIC BOTTLES Organism ID to follow CRITICAL RESULT CALLED TO, READ BACK BY AND VERIFIED WITH:  Stiven Kaspar B. AT 2240 06/01/24 LL Performed at [facility], [address] [place], [place] [address]    Culture GRAM POSITIVE COCCI  Final   Report Status PENDING  Incomplete  Blood Culture ID Panel (Reflexed)     Status: Abnormal   Collection Time: 06/01/24  6:15 AM  Result Value Ref Range Status   Enterococcus faecalis NOT DETECTED NOT DETECTED Final   Enterococcus Faecium DETECTED (A) NOT DETECTED Final    Comment: CRITICAL RESULT CALLED TO, READ BACK BY AND VERIFIED WITH: Jauna Raczynski B. [phone] [id number] LRL    Listeria monocytogenes NOT DETECTED NOT DETECTED Final   Staphylococcus species NOT DETECTED NOT DETECTED Final   Staphylococcus aureus (BCID) NOT DETECTED NOT DETECTED Final   Staphylococcus epidermidis NOT DETECTED NOT DETECTED Final   Staphylococcus lugdunensis NOT DETECTED NOT DETECTED Final   Streptococcus species NOT DETECTED NOT DETECTED Final   Streptococcus agalactiae NOT DETECTED NOT DETECTED Final   Streptococcus pneumoniae NOT DETECTED NOT DETECTED Final   Streptococcus pyogenes NOT DETECTED NOT DETECTED Final   A.calcoaceticus-baumannii NOT DETECTED NOT DETECTED Final   Bacteroides fragilis NOT DETECTED NOT DETECTED Final   Enterobacterales NOT DETECTED NOT DETECTED Final   Enterobacter cloacae complex NOT DETECTED NOT DETECTED Final   Escherichia coli NOT DETECTED NOT DETECTED Final   Klebsiella aerogenes NOT DETECTED NOT DETECTED Final   Klebsiella oxytoca NOT DETECTED NOT DETECTED Final   Klebsiella pneumoniae NOT DETECTED NOT DETECTED Final   Proteus species NOT DETECTED NOT DETECTED Final   Salmonella species NOT DETECTED NOT DETECTED Final   Serratia marcescens NOT DETECTED NOT DETECTED Final   Haemophilus influenzae NOT DETECTED NOT DETECTED Final   Neisseria meningitidis NOT DETECTED NOT DETECTED  Final   Pseudomonas aeruginosa NOT DETECTED NOT DETECTED Final   Stenotrophomonas maltophilia NOT DETECTED NOT DETECTED Final   Candida albicans NOT DETECTED NOT DETECTED Final   Candida auris NOT DETECTED NOT DETECTED Final   Candida glabrata NOT DETECTED NOT DETECTED Final   Candida krusei NOT DETECTED NOT DETECTED Final   Candida parapsilosis NOT DETECTED NOT DETECTED Final   Candida tropicalis NOT DETECTED NOT DETECTED Final   Cryptococcus neoformans/gattii NOT DETECTED NOT DETECTED Final   Vancomycin  resistance NOT DETECTED NOT DETECTED Final    Comment: Performed at [facility], [address] [place], [place] [address]  Blood culture (single)     Status: None (Preliminary result)   Collection Time: 06/01/24  7:34 AM   Specimen: BLOOD  Result Value Ref Range Status   Specimen Description BLOOD RIGHT ANTECUBITAL  Final   Special Requests   Final    BOTTLES DRAWN AEROBIC AND ANAEROBIC Blood Culture adequate volume   Culture  Setup Time   Final    GRAM POSITIVE COCCI IN BOTH AEROBIC AND ANAEROBIC BOTTLES CRITICAL VALUE NOTED.  VALUE IS CONSISTENT WITH PREVIOUSLY REPORTED AND CALLED VALUE. Performed at [facility], [address] [place], [place] [address]    Culture PENDING  Incomplete   Report Status PENDING  Incomplete    BCID Results: 2 single Blood Cx drawn, both 2 of 2 with GPC, Enterococcus Faecium detected.  Pt currently on Azithromycin  and Ceftriaxone  for CAP.  Pt was given one time dose of Vancomycin  in ED,  but was not continued.  Name of provider contacted: HILARIO Solian, MD   Changes to prescribed antibiotics required: Restart Vancomycin .  Continue Azithromycin  and Ceftriaxone  for CAP at this time.  Rankin CANDIE Dills, PharmD, [facility] 06/02/2024 12:59 AM

## 2024-06-01 NOTE — H&P (Signed)
 History and Physical    Leslie Parker FMW:[id number] DOB: 10-Oct-1932 DOA: 06/01/2024  DOS: the patient was seen and examined on 06/01/2024  PCP: Sherial Bail, MD   Patient coming from: Home  I have personally briefly reviewed patient's old medical records in [facility]  Chief Complaint: Cough congestion and generalized aches and pains   HPI: Leslie Parker is a pleasant 89 y.o. female with medical history significant for HTN, DM, CHF, A-fib on Eliquis , s/p pacemaker, CKD, anemia who presented to ED with complaints of cough, congestion, shortness of breath for the last 1 week.  She stated that she had dry cough also with some shortness of breath which is progressively worsening.  She also had some chills but did not check temperature.  She denies any nausea or vomiting, diarrhea or urinary symptoms.  She stated that she always have a leg swelling and they have not gotten worse.  ED Course: Upon arrival to the ED, patient is found to be hypertensive at 173/81, tachypneic at 24 saturation was 93% on room air, potassium of 3.1, creatinine 2.36 BUN 55, white count was normal at 8.2, chest x-ray showed bibasilar consolidation.  Patient was started on antibiotics, cultures were sent.  Viral panel were negative including COVID.  Hospital service was consulted for evaluation for admission for possible community-acquired pneumonia.  Review of Systems:  ROS  All other systems negative except as noted in the HPI.  Past Medical History:  Diagnosis Date   Acute kidney injury superimposed on CKD 11/24/2022   Asthma    Atrial fibrillation with RVR (HCC)    Cancer (HCC)    Basal Cell   CHF (congestive heart failure) (HCC)    Closed left hip fracture (HCC) 01/25/2017   Closed right hip fracture (HCC) 02/13/2023   Diabetes mellitus without complication (HCC)    Femur fracture, left (HCC) 02/03/2015   Heart murmur    Hypertension    Impetigo    Osteoarthritis    Osteopenia    Pacemaker  lead malfunction 11/28/2022   Rapid atrial fibrillation, new onset(HCC) 06/05/2022   Vomiting    can not due to surgery   Wears dentures    full upper and lower    Past Surgical History:  Procedure Laterality Date   ABDOMINAL HYSTERECTOMY     BACK SURGERY     BLADDER SURGERY     mesh   CARPAL TUNNEL RELEASE Bilateral    CATARACT EXTRACTION Right 2017   CATARACT EXTRACTION W/PHACO Left 02/06/2016   Procedure: CATARACT EXTRACTION PHACO AND INTRAOCULAR LENS PLACEMENT (IOC) left eye;  Surgeon: Donzell Arlyce Budd, MD;  Location: [facility];  Service: Ophthalmology;  Laterality: Left;  DIABETIC LEFT Cannot arrive before 9:30   CERVICAL DISC SURGERY     CHOLECYSTECTOMY     EYE SURGERY Bilateral    Cataract Extraction with IOL   FEMUR IM NAIL Left 02/04/2015   Procedure: INTRAMEDULLARY (IM) NAIL FEMORAL;  Surgeon: Franky Cranker, MD;  Location: [facility];  Service: Orthopedics;  Laterality: Left;   HARDWARE REMOVAL Left 01/17/2017   Procedure: HARDWARE REMOVAL;  Surgeon: Cranker Franky, MD;  Location: [facility];  Service: Orthopedics;  Laterality: Left;   HERNIA REPAIR  2014   esophageal and gastric mesh. patient unable to throw up d/t mesh   HIP ARTHROPLASTY Left 01/26/2017   Procedure: ARTHROPLASTY BIPOLAR HIP (HEMIARTHROPLASTY) removal hardware left hip;  Surgeon: Cleotilde Barrio, MD;  Location: [facility];  Service: Orthopedics;  Laterality: Left;  INTRAMEDULLARY (IM) NAIL INTERTROCHANTERIC Right 02/13/2023   Procedure: INTRAMEDULLARY (IM) NAIL INTERTROCHANTERIC;  Surgeon: Edie Norleen PARAS, MD;  Location: [facility];  Service: Orthopedics;  Laterality: Right;   JOINT REPLACEMENT Bilateral    knees   PACEMAKER IMPLANT N/A 11/15/2022   Procedure: PACEMAKER IMPLANT;  Surgeon: Ammon Blunt, MD;  Location: [facility];  Service: Cardiovascular;  Laterality: N/A;   PACEMAKER IMPLANT N/A 11/28/2022   Procedure: PACEMAKER IMPLANT;  Surgeon: Ammon Blunt, MD;   Location: [facility];  Service: Cardiovascular;  Laterality: N/A;  Lead reposition   REPLACEMENT TOTAL KNEE BILATERAL Bilateral [id number]   SHOULDER ARTHROSCOPY WITH OPEN ROTATOR CUFF REPAIR AND DISTAL CLAVICLE ACROMINECTOMY Left 10/25/2016   Procedure: SHOULDER ARTHROSCOPY WITH OPEN ROTATOR CUFF REPAIR AND DISTAL CLAVICLE ACROMINECTOMY;  Surgeon: Franky Cranker, MD;  Location: [facility];  Service: Orthopedics;  Laterality: Left;   THYROID  SURGERY     goiter removed   TOTAL HIP REVISION Left 12/02/2017   Procedure: TOTAL HIP REVISION;  Surgeon: Leora Lynwood SAUNDERS, MD;  Location: [facility];  Service: Orthopedics;  Laterality: Left;   TOTAL SHOULDER REPLACEMENT Right 2012     reports that she has never smoked. She has never used smokeless tobacco. She reports that she does not drink alcohol and does not use drugs.  Allergies  Allergen Reactions   Baclofen Other (See Comments)    Elevated Cr   Simvastatin Other (See Comments)    Pt denies  Elevated LFTs with simva 40mg , stopped and resolved (see MD note 11/10/13)    Family History  Problem Relation Age of Onset   Heart failure Mother    Hypertension Mother    Emphysema Father     Prior to Admission medications   Medication Sig Start Date End Date Taking? Authorizing Provider  acetaminophen  (TYLENOL ) 325 MG tablet Take 2 tablets (650 mg total) by mouth every 8 (eight) hours as needed for fever, headache or mild pain (pain score 1-3). 03/24/24   Von Bellis, MD  albuterol  (VENTOLIN  HFA) 108 (90 Base) MCG/ACT inhaler Inhale 2 puffs into the lungs every 6 (six) hours as needed. 08/04/19   [provider]  alum & mag hydroxide-simeth (MAALOX/MYLANTA) 200-200-20 MG/5ML suspension Take 30 mLs by mouth every 6 (six) hours as needed for indigestion or heartburn. 11/29/23   Maree Hue, MD  amiodarone  (PACERONE ) 200 MG tablet Take 1 tablet by mouth daily. 08/26/23   [provider]  apixaban  (ELIQUIS ) 2.5 MG TABS tablet Take 1  tablet (2.5 mg total) by mouth 2 (two) times daily. 06/07/22   Jhonny Calvin NOVAK, MD  ascorbic acid  (VITAMIN C ) 250 MG tablet Take 250 mg by mouth daily.    [provider]  carvedilol  (COREG ) 3.125 MG tablet Take 1 tablet (3.125 mg total) by mouth 2 (two) times daily with a meal. Reduced from 6.25 mg. 04/16/24 05/16/24  Awanda City, MD  Cholecalciferol  (VITAMIN D -1000 MAX ST) 25 MCG (1000 UT) tablet Take 1,000 Units by mouth daily.    [provider]  guaiFENesin -dextromethorphan  (ROBITUSSIN DM) 100-10 MG/5ML syrup Take 10 mLs by mouth every 6 (six) hours as needed for cough. 03/24/24   Von Bellis, MD  hydrALAZINE  (APRESOLINE ) 25 MG tablet 25 mg 3 (three) times daily. 0800/1400/2000    [provider]  levothyroxine  (SYNTHROID ) 25 MCG tablet Take 2 tablets (50 mcg total) by mouth daily at 6 (six) AM. 04/16/24   Awanda City, MD  melatonin 5 MG TABS Take 1 tablet (5  mg total) by mouth at bedtime as needed. 03/24/24   Von Bellis, MD  Multiple Vitamin (MULTIVITAMIN WITH MINERALS) TABS tablet Take 1 tablet by mouth daily. One-A-Day Women's Vitamin    [provider]  ondansetron  (ZOFRAN ) 4 MG tablet Take 1 tablet (4 mg total) by mouth every 6 (six) hours as needed for nausea. 11/17/23   Josette Ade, MD  pantoprazole  (PROTONIX ) 40 MG tablet Take 1 tablet (40 mg total) by mouth 2 (two) times daily. 03/01/24 04/22/24  Dezii, Alexandra, DO  polyethylene glycol (MIRALAX  / GLYCOLAX ) 17 g packet Take 17 g by mouth daily as needed. 11/29/23   Maree Hue, MD  senna-docusate (SENOKOT-S) 8.6-50 MG tablet Take 2 tablets by mouth at bedtime as needed for mild constipation. 11/29/23   Maree Hue, MD  TRELEGY ELLIPTA 100-62.5-25 MCG/ACT AEPB Inhale 1 puff into the lungs daily. 08/03/22   [provider]    Physical Exam: Vitals:   06/01/24 0609 06/01/24 0700 06/01/24 0708 06/01/24 0830  BP:   (!) 142/64 (!) 153/60  Pulse:  70 69 66  Resp:  19 20 20   Temp:      TempSrc:       SpO2:  94% 93% 93%  Weight: 78.2 kg     Height: 5' 9 (1.753 m)       Physical Exam   Constitutional: Alert, awake, calm, comfortable HEENT: Neck supple Respiratory: Clear to auscultation B/L, scattered wheezing, decreased air entry on both lung field Cardiovascular: Regular rate and rhythm, no murmurs / rubs / gallops.  2+ lower extremity edema. 2+ pedal pulses. No carotid bruits.  Abdomen: Soft, no tenderness, Bowel sounds positive.  Musculoskeletal: no clubbing / cyanosis. Good ROM, no contractures. Normal muscle tone.  Skin: no rashes, lesions, ulcers. Neurologic: CN 2-12 grossly intact. Sensation intact, No focal deficit identified Psychiatric: Alert and oriented x 3. Normal mood.    Labs on Admission: I have personally reviewed following labs and imaging studies  CBC: Recent Labs  Lab 06/01/24 0615  WBC 8.2  NEUTROABS 6.1  HGB 11.4*  HCT 36.0  MCV 94.7  PLT 248   Basic Metabolic Panel: Recent Labs  Lab 06/01/24 0614 06/01/24 0615  NA  --  137  K  --  3.1*  CL  --  104  CO2  --  20*  GLUCOSE  --  118*  BUN  --  55*  CREATININE  --  2.36*  CALCIUM  --  8.4*  MG 1.7  --    GFR: Estimated Creatinine Clearance: 16.9 mL/min (A) (by C-G formula based on SCr of 2.36 mg/dL (H)). Liver Function Tests: Recent Labs  Lab 06/01/24 0615  AST 51*  ALT 42  ALKPHOS 125  BILITOT 1.5*  PROT 6.4*  ALBUMIN  2.5*   No results for input(s): LIPASE, AMYLASE in the last 168 hours. No results for input(s): AMMONIA in the last 168 hours. Coagulation Profile: No results for input(s): INR, PROTIME in the last 168 hours. Cardiac Enzymes: Recent Labs  Lab 06/01/24 0614 06/01/24 0615 06/01/24 0916  CKTOTAL 30*  --   --   TROPONINIHS  --  46* 45*   BNP (last 3 results) Recent Labs    03/11/24 2304 04/13/24 0256 06/01/24 0615  BNP >4,500.0* >4,500.0* >4,500.0*   HbA1C: No results for input(s): HGBA1C in the last 72 hours. CBG: No results for  input(s): GLUCAP in the last 168 hours. Lipid Profile: No results for input(s): CHOL, HDL, LDLCALC, TRIG, CHOLHDL, LDLDIRECT  in the last 72 hours. Thyroid  Function Tests: No results for input(s): TSH, T4TOTAL, FREET4, T3FREE, THYROIDAB in the last 72 hours. Anemia Panel: No results for input(s): VITAMINB12, FOLATE, FERRITIN, TIBC, IRON, RETICCTPCT in the last 72 hours. Urine analysis:    Component Value Date/Time   COLORURINE AMBER (A) 04/13/2024 0321   APPEARANCEUR CLOUDY (A) 04/13/2024 0321   APPEARANCEUR Clear 02/19/2013 0254   LABSPEC 1.014 04/13/2024 0321   LABSPEC 1.006 02/19/2013 0254   PHURINE 5.0 04/13/2024 0321   GLUCOSEU NEGATIVE 04/13/2024 0321   GLUCOSEU Negative 02/19/2013 0254   HGBUR NEGATIVE 04/13/2024 0321   BILIRUBINUR NEGATIVE 04/13/2024 0321   BILIRUBINUR Negative 02/19/2013 0254   KETONESUR NEGATIVE 04/13/2024 0321   PROTEINUR 100 (A) 04/13/2024 0321   NITRITE POSITIVE (A) 04/13/2024 0321   LEUKOCYTESUR LARGE (A) 04/13/2024 0321   LEUKOCYTESUR 1+ 02/19/2013 0254    Radiological Exams on Admission: I have personally reviewed images DG Chest Portable 1 View Result Date: 06/01/2024 CLINICAL DATA:  Cough and shortness of breath. EXAM: PORTABLE CHEST 1 VIEW COMPARISON:  04/13/2024 FINDINGS: The cardio pericardial silhouette is enlarged. Bibasilar collapse/consolidation with small to moderate bilateral pleural effusions, progressive in the interval. Left permanent pacemaker again noted. Telemetry leads overlie the chest. IMPRESSION: Bibasilar collapse/consolidation with small to moderate bilateral pleural effusions, progressive in the interval. Electronically Signed   By: Camellia Candle M.D.   On: 06/01/2024 06:24    EKG: My personal interpretation of EKG shows: Abnormal EKG paced rhythm    Assessment/Plan Active Problems:   Chronic a-fib (HCC)   Chronic diastolic CHF (congestive heart failure) (HCC)   CKD (chronic kidney  disease) stage 4, GFR 15-29 ml/min (HCC)   Essential hypertension   Diabetes mellitus without complication (HCC)   COPD (chronic obstructive pulmonary disease) (HCC)   Hypothyroidism   Community acquired pneumonia    Assessment and Plan: 89 year old female/PMH of congestive heart failure on torsemide , COPD not on home oxygen, A-fib on Eliquis  and amiodarone , CKD stage IV, HTN, DM, hypothyroidism who was brought in for cough congestion and bodyaches and pain.  Found to have possible COVID acquired pneumonia on chest x-ray.  1.  Community-acquired pneumonia in a patient who has a complex medical history including COPD and CHF. - She will be admitted to hospital as inpatient due to concern of getting worse. - Patient was started on cefepime  and vancomycin  in the ED. - This could be community-acquired pneumonia so I will place her on ceftriaxone  and azithromycin . - Follow-up cultures and response treatment  2.  Congestive heart failure, does not appear to be volume overloaded - It appears that she has a chronically elevated BNP - Patient is to get torsemide  40 mg daily. - Due to pneumonia and also has AKI, will hold off torsemide  for today and if she appears to be fluid overloaded we can resume that possibly tomorrow. - Continue Coreg .  3.  COPD - Does not appear to be hypoxemic - Continue DuoNebs - Monitor oxygen, add oxygen supplementation to maintain saturation more than 90% if needed  4.  Atrial fibrillation, chronic - Continue amiodarone , Eliquis , Coreg  - Continue to monitor in telemetry  5.  GERD - Continue Protonix   6.  Hypothyroidism - Continue levothyroxine   6.  HTN - Continue Coreg  - Continue to monitor blood pressure  7.  Hypokalemia - Replace potassium and check potassium level  8.  AKI on CKD - Continue to monitor kidney function - Continue to hold off torsemide  today  DVT prophylaxis: SQ Heparin  Code Status: Full Code Family Communication:  None Disposition Plan: Back to skilled nursing facility Consults called: None Admission status: Inpatient, Telemetry bed   Nena Rebel, MD [facility] 06/01/2024, 10:41 AM

## 2024-06-01 NOTE — Plan of Care (Signed)
°  Problem: Education: Goal: Knowledge of General Education information will improve Description: Including pain rating scale, medication(s)/side effects and non-pharmacologic comfort measures Outcome: Progressing   Problem: Clinical Measurements: Goal: Ability to maintain clinical measurements within normal limits will improve Outcome: Progressing   Problem: Activity: Goal: Risk for activity intolerance will decrease Outcome: Progressing   Problem: Coping: Goal: Level of anxiety will decrease Outcome: Progressing   Problem: Elimination: Goal: Will not experience complications related to bowel motility Outcome: Progressing

## 2024-06-01 NOTE — TOC Initial Note (Signed)
 Transition of Care [facility]) - Initial/Assessment Note    Patient Details  Name: Leslie Parker MRN: [id number] Date of Birth: 1933/01/09  [facility]) CM/SW Contact:    Alvaro Louder, LCSW Phone Number: 06/01/2024, 3:10 PM  Clinical Narrative:   Per chart review patient is from [place] PCP is Lavenia Beaver. Patient will return to [facility] when medically ready. LCSWA to start insurance auth one PT and OT recs are submitted.   TOC to follow for discharge                 Patient Goals and CMS Choice            Expected Discharge Plan and Services                                              Prior Living Arrangements/Services                       Activities of Daily Living   ADL Screening (condition at time of admission) Independently performs ADLs?: No Does the patient have a NEW difficulty with bathing/dressing/toileting/self-feeding that is expected to last >3 days?: No Does the patient have a NEW difficulty with getting in/out of bed, walking, or climbing stairs that is expected to last >3 days?: No Does the patient have a NEW difficulty with communication that is expected to last >3 days?: No Is the patient deaf or have difficulty hearing?: No Does the patient have difficulty seeing, even when wearing glasses/contacts?: No Does the patient have difficulty concentrating, remembering, or making decisions?: No  Permission Sought/Granted                  Emotional Assessment              Admission diagnosis:  Community acquired pneumonia [J18.9] HCAP (healthcare-associated pneumonia) [J18.9] Patient Active Problem List   Diagnosis Date Noted   Community acquired pneumonia 06/01/2024   Urinary dysfunction 04/23/2024   UTI (urinary tract infection) 04/13/2024   Hypothermia 03/15/2024   Hypothyroidism 03/15/2024   Aspiration pneumonia (HCC) 03/14/2024   COVID-19 virus infection 03/13/2024   Acute  on chronic combined systolic and diastolic congestive heart failure (HCC) 03/12/2024   COPD with acute exacerbation (HCC) 03/12/2024   Protein-calorie malnutrition, severe 01/22/2024   Acute on chronic systolic CHF (congestive heart failure) (HCC) 12/14/2023   Scapular fracture 11/26/2023   Acute hypoxic respiratory failure (HCC) 11/15/2023   Do not resuscitate 11/10/2023   CHF exacerbation (HCC) 11/10/2023   Myocardial injury 11/09/2023   AKI (acute kidney injury) 11/05/2023   Insomnia 11/05/2023   Frequent hospital admissions 11/05/2023   Acute exacerbation of CHF (congestive heart failure) (HCC) 10/22/2023   Elevated troponin 10/22/2023   Malnutrition of moderate degree 09/17/2023   CHF (congestive heart failure) (HCC) 09/15/2023   Acute on chronic HFrEF (heart failure with reduced ejection fraction) (HCC) 08/19/2023   Pleural effusion due to CHF (congestive heart failure) (HCC) 08/19/2023   HTN (hypertension), malignant 08/19/2023   Ileus (HCC) 08/12/2023   Large bowel obstruction (HCC) 08/12/2023   CKD (chronic kidney disease) stage 4, GFR 15-29 ml/min (HCC) 08/12/2023   HFrEF (heart failure with reduced ejection fraction) (HCC) 08/12/2023   Acute on chronic combined systolic and diastolic CHF (congestive heart failure) (HCC) 08/10/2023   Hyponatremia 05/05/2023  Overweight (BMI 25.0-29.9) 05/01/2023   Left hip pain 05/01/2023   Hypokalemia 04/30/2023   Anemia 03/21/2023   Abnormal LFTs 03/20/2023   Fall at home, initial encounter 02/13/2023   Type II diabetes mellitus with renal manifestations (HCC) 02/13/2023   Chronic diastolic CHF (congestive heart failure) (HCC) 02/13/2023   CKD stage 3 due to type 2 diabetes mellitus (HCC) 11/24/2022   Paroxysmal atrial fibrillation (HCC) 11/24/2022   Sick sinus syndrome (HCC) 11/15/2022   Acute on chronic diastolic CHF (congestive heart failure) (HCC) 08/27/2022   COPD (chronic obstructive pulmonary disease) (HCC) 08/23/2022    Chronic a-fib (HCC) 08/22/2022   Diabetes mellitus without complication (HCC)    Stage 3b chronic kidney disease (HCC) - Baseline scr 1.8-2.0    Postural dizziness with presyncope    Hypomagnesemia    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 [place] [address] Phone: [phone] Fax: [phone]  [id number] [place] - [address] [place] [address] Phone: [phone] Fax: [phone]  [facility] [address] [place] [address] Phone: [phone] Fax: [phone]     Social Drivers of Health (SDOH) Social History: SDOH Screenings   Food Insecurity: No Food Insecurity (06/01/2024)  Housing: Low Risk  (06/01/2024)  Transportation Needs: No Transportation Needs (06/01/2024)  Utilities: Not At Risk (06/01/2024)  Alcohol Screen: Low Risk  (08/20/2023)  Financial Resource Strain: Low Risk  (01/13/2024)   Received from [facility]  Social Connections: Moderately Isolated (06/01/2024)  Tobacco Use: Low Risk  (06/01/2024)   SDOH Interventions:     Readmission Risk Interventions    10/22/2023    2:28 PM 09/16/2023    2:52 PM 08/20/2023    3:45 PM  Readmission Risk Prevention Plan  Transportation Screening Complete Complete Complete  Medication Review Oceanographer) Complete Complete Complete  PCP or Specialist appointment within 3-5 days of discharge  Complete Complete  HRI or Home Care Consult Not Complete Patient refused Complete  HRI or Home Care Consult Pt Refusal Comments No PT recs at this time.    SW Recovery Care/Counseling Consult Complete Complete Complete  Palliative Care Screening Not Applicable Not Applicable Not Applicable  Skilled  Nursing Facility Not Applicable Not Applicable Not Applicable

## 2024-06-01 NOTE — Progress Notes (Signed)
   06/01/24 1103  Vitals  Temp (!) 97 F (36.1 C)  BP (!) 151/60  MAP (mmHg) 86  BP Location Right Arm  BP Method Automatic  Patient Position (if appropriate) Lying  Pulse Rate 63  Pulse Rate Source Monitor  Resp 18  MEWS COLOR  MEWS Score Color Green  Oxygen Therapy  SpO2 95 %  O2 Device Room Air  MEWS Score  MEWS Temp 0  MEWS Systolic 0  MEWS Pulse 0  MEWS RR 0  MEWS LOC 0  MEWS Score 0   Patient admitted from ED alert on room air.  Skin assessment completed with Bre, RN.  Bladder scan showed 138 and all safety precautions in place.  Call bell education provided and within reach. Plan of care continues.

## 2024-06-01 NOTE — Progress Notes (Signed)
 PT Cancellation Note  Patient Details Name: TARSHIA KOT MRN: [id number] DOB: 08-05-1932   Cancelled Treatment:    Reason Eval/Treat Not Completed: Fatigue/lethargy limiting ability to participate (Consult received and chart reviewed.  Patient sleeping soundly upon arrival to room.  Occasionally moans/mumbles with max stimulation (lights on, covers down, washcloth to hand/face), but unable to arouse for adequate participation with evaluation.  RN/MD informed and aware.  Will continue efforts next date as appropriate.)  Khalani Novoa H. Delores, PT, DPT, NCS 06/01/24, 3:54 PM [phone]

## 2024-06-01 NOTE — ED Provider Notes (Signed)
 [facility] Provider Note    Event Date/Time   First MD Initiated Contact with Patient 06/01/24 [phone]     (approximate)   History   Nasal Congestion, Chills, and Generalized Body Aches   HPI  Leslie Parker is a 89 y.o. female with history of atrial fibrillation, CHF, hypertension, diabetes, pacemaker, chronic kidney disease, anemia who presents to the emergency department with complaints of cough, congestion, shortness of breath for the past several days.  Reports she is having bodyaches.  She has had chills but no known fever.  No vomiting or diarrhea.  No urinary symptoms.  No chest pain.  EMS reports nursing home staff noted that her blood pressure was elevated today.   History provided by patient, EMS.    Past Medical History:  Diagnosis Date   Acute kidney injury superimposed on CKD 11/24/2022   Asthma    Atrial fibrillation with RVR (HCC)    Cancer (HCC)    Basal Cell   CHF (congestive heart failure) (HCC)    Closed left hip fracture (HCC) 01/25/2017   Closed right hip fracture (HCC) 02/13/2023   Diabetes mellitus without complication (HCC)    Femur fracture, left (HCC) 02/03/2015   Heart murmur    Hypertension    Impetigo    Osteoarthritis    Osteopenia    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   Rapid atrial fibrillation, new onset(HCC) 06/05/2022   Vomiting    can not due to surgery   Wears dentures    full upper and lower    Past Surgical History:  Procedure Laterality Date   ABDOMINAL HYSTERECTOMY     BACK SURGERY     BLADDER SURGERY     mesh   CARPAL TUNNEL RELEASE Bilateral    CATARACT EXTRACTION Right 2017   CATARACT EXTRACTION W/PHACO Left 02/06/2016   Procedure: CATARACT EXTRACTION PHACO AND INTRAOCULAR LENS PLACEMENT (IOC) left eye;  Surgeon: Donzell Arlyce Budd, MD;  Location: [facility];  Service: Ophthalmology;  Laterality: Left;  DIABETIC LEFT Cannot arrive before 9:30   CERVICAL DISC SURGERY      CHOLECYSTECTOMY     EYE SURGERY Bilateral    Cataract Extraction with IOL   FEMUR IM NAIL Left 02/04/2015   Procedure: INTRAMEDULLARY (IM) NAIL FEMORAL;  Surgeon: Franky Cranker, MD;  Location: [facility];  Service: Orthopedics;  Laterality: Left;   HARDWARE REMOVAL Left 01/17/2017   Procedure: HARDWARE REMOVAL;  Surgeon: Cranker Franky, MD;  Location: [facility];  Service: Orthopedics;  Laterality: Left;   HERNIA REPAIR  2014   esophageal and gastric mesh. patient unable to throw up d/t mesh   HIP ARTHROPLASTY Left 01/26/2017   Procedure: ARTHROPLASTY BIPOLAR HIP (HEMIARTHROPLASTY) removal hardware left hip;  Surgeon: Cleotilde Barrio, MD;  Location: [facility];  Service: Orthopedics;  Laterality: Left;   INTRAMEDULLARY (IM) NAIL INTERTROCHANTERIC Right 02/13/2023   Procedure: INTRAMEDULLARY (IM) NAIL INTERTROCHANTERIC;  Surgeon: Edie Norleen PARAS, MD;  Location: [facility];  Service: Orthopedics;  Laterality: Right;   JOINT REPLACEMENT Bilateral    knees   PACEMAKER IMPLANT N/A 11/15/2022   Procedure: PACEMAKER IMPLANT;  Surgeon: Ammon Blunt, MD;  Location: [facility];  Service: Cardiovascular;  Laterality: N/A;   PACEMAKER IMPLANT N/A 11/28/2022   Procedure: PACEMAKER IMPLANT;  Surgeon: Ammon Blunt, MD;  Location: [facility];  Service: Cardiovascular;  Laterality: N/A;  Lead reposition   REPLACEMENT TOTAL KNEE BILATERAL Bilateral [id number]   SHOULDER ARTHROSCOPY WITH OPEN ROTATOR  CUFF REPAIR AND DISTAL CLAVICLE ACROMINECTOMY Left 10/25/2016   Procedure: SHOULDER ARTHROSCOPY WITH OPEN ROTATOR CUFF REPAIR AND DISTAL CLAVICLE ACROMINECTOMY;  Surgeon: Franky Cranker, MD;  Location: [facility];  Service: Orthopedics;  Laterality: Left;   THYROID  SURGERY     goiter removed   TOTAL HIP REVISION Left 12/02/2017   Procedure: TOTAL HIP REVISION;  Surgeon: Leora Lynwood SAUNDERS, MD;  Location: [facility];  Service: Orthopedics;  Laterality: Left;   TOTAL SHOULDER REPLACEMENT Right 2012     MEDICATIONS:  Prior to Admission medications   Medication Sig Start Date End Date Taking? Authorizing Provider  acetaminophen  (TYLENOL ) 325 MG tablet Take 2 tablets (650 mg total) by mouth every 8 (eight) hours as needed for fever, headache or mild pain (pain score 1-3). 03/24/24   Von Bellis, MD  albuterol  (VENTOLIN  HFA) 108 (90 Base) MCG/ACT inhaler Inhale 2 puffs into the lungs every 6 (six) hours as needed. 08/04/19   [provider]  alum & mag hydroxide-simeth (MAALOX/MYLANTA) 200-200-20 MG/5ML suspension Take 30 mLs by mouth every 6 (six) hours as needed for indigestion or heartburn. 11/29/23   Maree Hue, MD  amiodarone  (PACERONE ) 200 MG tablet Take 1 tablet by mouth daily. 08/26/23   [provider]  apixaban  (ELIQUIS ) 2.5 MG TABS tablet Take 1 tablet (2.5 mg total) by mouth 2 (two) times daily. 06/07/22   Jhonny Calvin NOVAK, MD  ascorbic acid  (VITAMIN C ) 250 MG tablet Take 250 mg by mouth daily.    [provider]  carvedilol  (COREG ) 3.125 MG tablet Take 1 tablet (3.125 mg total) by mouth 2 (two) times daily with a meal. Reduced from 6.25 mg. 04/16/24 05/16/24  Awanda City, MD  Cholecalciferol  (VITAMIN D -1000 MAX ST) 25 MCG (1000 UT) tablet Take 1,000 Units by mouth daily.    [provider]  guaiFENesin -dextromethorphan  (ROBITUSSIN DM) 100-10 MG/5ML syrup Take 10 mLs by mouth every 6 (six) hours as needed for cough. 03/24/24   Von Bellis, MD  hydrALAZINE  (APRESOLINE ) 25 MG tablet 25 mg 3 (three) times daily. 0800/1400/2000    [provider]  levothyroxine  (SYNTHROID ) 25 MCG tablet Take 2 tablets (50 mcg total) by mouth daily at 6 (six) AM. 04/16/24   Awanda City, MD  melatonin 5 MG TABS Take 1 tablet (5 mg total) by mouth at bedtime as needed. 03/24/24   Von Bellis, MD  Multiple Vitamin (MULTIVITAMIN WITH MINERALS) TABS tablet Take 1 tablet by mouth daily. One-A-Day Women's Vitamin    [provider]  ondansetron  (ZOFRAN ) 4 MG tablet  Take 1 tablet (4 mg total) by mouth every 6 (six) hours as needed for nausea. 11/17/23   Josette Ade, MD  pantoprazole  (PROTONIX ) 40 MG tablet Take 1 tablet (40 mg total) by mouth 2 (two) times daily. 03/01/24 04/22/24  Dezii, Alexandra, DO  polyethylene glycol (MIRALAX  / GLYCOLAX ) 17 g packet Take 17 g by mouth daily as needed. 11/29/23   Maree Hue, MD  senna-docusate (SENOKOT-S) 8.6-50 MG tablet Take 2 tablets by mouth at bedtime as needed for mild constipation. 11/29/23   Maree Hue, MD  TRELEGY ELLIPTA 100-62.5-25 MCG/ACT AEPB Inhale 1 puff into the lungs daily. 08/03/22   [provider]    Physical Exam   Triage Vital Signs: ED Triage Vitals  Encounter Vitals Group     BP 06/01/24 0606 (!) 173/81     Girls Systolic BP Percentile --      Girls Diastolic BP Percentile --      Boys  Systolic BP Percentile --      Boys Diastolic BP Percentile --      Pulse Rate 06/01/24 0606 81     Resp 06/01/24 0606 (!) 24     Temp 06/01/24 0606 99.7 F (37.6 C)     Temp Source 06/01/24 0606 Rectal     SpO2 06/01/24 0606 94 %     Weight 06/01/24 0609 172 lb 4.8 oz (78.2 kg)     Height 06/01/24 0609 5' 9 (1.753 m)     Head Circumference --      Peak Flow --      Pain Score 06/01/24 0609 7     Pain Loc --      Pain Education --      Exclude from Growth Chart --      Most recent vital signs: Vitals:   06/01/24 0700 06/01/24 0708  BP:  (!) 142/64  Pulse: 70 69  Resp: 19 20  Temp:    SpO2: 94% 93%    CONSTITUTIONAL: Alert, responds appropriately to questions.  Elderly HEAD: Normocephalic, atraumatic EYES: Conjunctivae clear, pupils appear equal, sclera nonicteric ENT: normal nose; moist mucous membranes NECK: Supple, normal ROM CARD: RRR; S1 and S2 appreciated RESP: Normal chest excursion without splinting or tachypnea; breath sounds clear and equal bilaterally; no wheezes, no rhonchi, no rales, no hypoxia or respiratory distress, speaking full sentences ABD/GI:  Non-distended; soft, non-tender, no rebound, no guarding, no peritoneal signs BACK: The back appears normal EXT: Normal ROM in all joints; no deformity noted, no edema SKIN: Normal color for age and race; warm; no rash on exposed skin NEURO: Moves all extremities equally, normal speech PSYCH: The patient's mood and manner are appropriate.   ED Results / Procedures / Treatments   LABS: (all labs ordered are listed, but only abnormal results are displayed) Labs Reviewed  CBC WITH DIFFERENTIAL/PLATELET - Abnormal; Notable for the following components:      Result Value   RBC 3.80 (*)    Hemoglobin 11.4 (*)    RDW 18.6 (*)    All other components within normal limits  RESP PANEL BY RT-PCR (RSV, FLU A&B, COVID)  RVPGX2  CULTURE, BLOOD (SINGLE)  CULTURE, BLOOD (SINGLE)  COMPREHENSIVE METABOLIC PANEL WITH GFR  LACTIC ACID, PLASMA  LACTIC ACID, PLASMA  BRAIN NATRIURETIC PEPTIDE  PROCALCITONIN  URINALYSIS, W/ REFLEX TO CULTURE (INFECTION SUSPECTED)  CK  MAGNESIUM   TROPONIN I (HIGH SENSITIVITY)     EKG:  EKG Interpretation Date/Time:  Monday June 01 2024 06:30:17 EST Ventricular Rate:  75 PR Interval:    QRS Duration:  140 QT Interval:  447 QTC Calculation: 500 R Axis:   -7  Text Interpretation: Accelerated junctional rhythm IVCD, consider atypical LBBB Confirmed by Neomi Neptune [phone]) on 06/01/2024 7:11:21 AM         RADIOLOGY: My personal review and interpretation of imaging: Chest x-ray shows bilateral consolidation and bilateral pleural effusions.  I have personally reviewed all radiology reports.   DG Chest Portable 1 View Result Date: 06/01/2024 CLINICAL DATA:  Cough and shortness of breath. EXAM: PORTABLE CHEST 1 VIEW COMPARISON:  04/13/2024 FINDINGS: The cardio pericardial silhouette is enlarged. Bibasilar collapse/consolidation with small to moderate bilateral pleural effusions, progressive in the interval. Left permanent pacemaker again noted. Telemetry  leads overlie the chest. IMPRESSION: Bibasilar collapse/consolidation with small to moderate bilateral pleural effusions, progressive in the interval. Electronically Signed   By: Camellia Candle M.D.   On: 06/01/2024 06:24  PROCEDURES:  Critical Care performed: Yes, see critical care procedure note(s)   CRITICAL CARE Performed by: Josette Sink   Total critical care time: 30 minutes  Critical care time was exclusive of separately billable procedures and treating other patients.  Critical care was necessary to treat or prevent imminent or life-threatening deterioration.  Critical care was time spent personally by me on the following activities: development of treatment plan with patient and/or surrogate as well as nursing, discussions with consultants, evaluation of patient's response to treatment, examination of patient, obtaining history from patient or surrogate, ordering and performing treatments and interventions, ordering and review of laboratory studies, ordering and review of radiographic studies, pulse oximetry and re-evaluation of patient's condition.   SABRA1-3 Lead EKG Interpretation  Performed by: Geramy Lamorte, Josette SAILOR, DO Authorized by: Michelyn Scullin N, DO     ECG rate:  81   ECG rate assessment: normal     Rhythm: sinus rhythm     Ectopy: none     Conduction: normal       IMPRESSION / MDM / ASSESSMENT AND PLAN / ED COURSE  I reviewed the triage vital signs and the nursing notes.    Patient here with complaints of shortness of breath, cough, chills, body aches.  The patient is on the cardiac monitor to evaluate for evidence of arrhythmia and/or significant heart rate changes.   DIFFERENTIAL DIAGNOSIS (includes but not limited to):   Viral URI, pneumonia, UTI, anemia, electrolyte derangement, ACS, CHF, PE, doubt dissection   Patient's presentation is most consistent with acute presentation with potential threat to life or bodily function.   PLAN: Hemodynamically  stable.  Rectal temp 99.7.  Will obtain labs, EKG, cultures, urine, respiratory viral panel.  Will give Tylenol .  Patient is mildly hypertensive but is asymptomatic from this.  Suspect this is secondary to not feeling well.  Will continue to monitor.   MEDICATIONS GIVEN IN ED: Medications  ceFEPIme  (MAXIPIME ) 2 g in sodium chloride  0.9 % 100 mL IVPB (has no administration in time range)  vancomycin  (VANCOREADY) IVPB 1750 mg/350 mL (has no administration in time range)  acetaminophen  (TYLENOL ) tablet 1,000 mg (1,000 mg Oral Given 06/01/24 0630)     ED COURSE: Patient's labs show no leukocytosis.  Stable mild anemia.  COVID, flu and RSV negative.  Additional labs, urine pending.  Chest x-ray reviewed and interpreted by myself and the radiologist and shows bibasilar consolidation with small to moderate bilateral pleural effusions worse compared to previous.  Will give IV antibiotics.  Anticipate admission due to age, comorbidities, intermittent tachypnea.  No hypoxia.  Signed out the oncoming ED physician at 7:10 AM.   CONSULTS: Patient will need to be admitted to the hospitalist service.   OUTSIDE RECORDS REVIEWED: Reviewed recent nephrology notes.       FINAL CLINICAL IMPRESSION(S) / ED DIAGNOSES   Final diagnoses:  HCAP (healthcare-associated pneumonia)     Rx / DC Orders   ED Discharge Orders     None        Note:  This document was prepared using Dragon voice recognition software and may include unintentional dictation errors.   Federica Allport, Josette SAILOR, DO 06/01/24 [phone]

## 2024-06-01 NOTE — Consult Note (Signed)
 WOC Nurse Consult Note: Reason for Consult: wounds present on admission  Wound type: 1. Full thickness B forearms likely r/t trauma R arm scattered scabbed area; L forearm skin tear red moist  2.  Sacrum Deep Tissue Pressure Injury purple discoloration with a dry necrotic center  3.  L lower leg scattered partial thickness likely r/t trauma dry hemorrhagic material  4.  R heel unstageable pressure injury 100% black eschar  Pressure Injury POA: Yes Measurement: see nursing flowsheet  Wound bed: as above  Drainage (amount, consistency, odor) see nursing flowsheet  Periwound: Extensive Stage 1 Pressure Injury noted to sacrum/low back  Dressing procedure/placement/frequency: Cleanse B forearm and L lower leg wounds with NS, apply Xeroform gauze to wound beds every other day and secure with silicone foam. Soak dressing with NS if adhered to wound bed for atraumatic removal.  Cleanse sacral wound with soap and water, dry and apply Xeroform gauze Soila [phone]) to wound bed daily. Secure with silicone foam, may lift foam daily to replace Xeroform. Change foam q3 days and prn soiling.  Paint R heel wound with Betadine  daily and allow to air dry. Apply dry gauze and Kerlix or silicone foam whichever is preferred. Place R foot in Prevalon boot to offload pressure.   POC discussed with bedside nurse. WOC team will not follow. Re-consult if further needs arise.   Thank you,    Powell Bar MSN, RN-BC, [employer]

## 2024-06-02 ENCOUNTER — Inpatient Hospital Stay

## 2024-06-02 DIAGNOSIS — B952 Enterococcus as the cause of diseases classified elsewhere: Secondary | ICD-10-CM

## 2024-06-02 DIAGNOSIS — K5900 Constipation, unspecified: Secondary | ICD-10-CM | POA: Diagnosis not present

## 2024-06-02 DIAGNOSIS — I482 Chronic atrial fibrillation, unspecified: Secondary | ICD-10-CM

## 2024-06-02 DIAGNOSIS — I129 Hypertensive chronic kidney disease with stage 1 through stage 4 chronic kidney disease, or unspecified chronic kidney disease: Secondary | ICD-10-CM

## 2024-06-02 DIAGNOSIS — N184 Chronic kidney disease, stage 4 (severe): Secondary | ICD-10-CM | POA: Diagnosis not present

## 2024-06-02 DIAGNOSIS — J90 Pleural effusion, not elsewhere classified: Secondary | ICD-10-CM

## 2024-06-02 DIAGNOSIS — E1122 Type 2 diabetes mellitus with diabetic chronic kidney disease: Secondary | ICD-10-CM

## 2024-06-02 DIAGNOSIS — Z950 Presence of cardiac pacemaker: Secondary | ICD-10-CM

## 2024-06-02 DIAGNOSIS — R7881 Bacteremia: Secondary | ICD-10-CM | POA: Diagnosis not present

## 2024-06-02 LAB — COMPREHENSIVE METABOLIC PANEL WITH GFR
ALT: 36 U/L (ref 0–44)
AST: 41 U/L (ref 15–41)
Albumin: 2.2 g/dL — ABNORMAL LOW (ref 3.5–5.0)
Alkaline Phosphatase: 103 U/L (ref 38–126)
Anion gap: 12 (ref 5–15)
BUN: 64 mg/dL — ABNORMAL HIGH (ref 8–23)
CO2: 21 mmol/L — ABNORMAL LOW (ref 22–32)
Calcium: 8.2 mg/dL — ABNORMAL LOW (ref 8.9–10.3)
Chloride: 103 mmol/L (ref 98–111)
Creatinine, Ser: 2.34 mg/dL — ABNORMAL HIGH (ref 0.44–1.00)
GFR, Estimated: 19 mL/min — ABNORMAL LOW (ref >60)
Glucose, Bld: 118 mg/dL — ABNORMAL HIGH (ref 70–99)
Potassium: 3.1 mmol/L — ABNORMAL LOW (ref 3.5–5.1)
Sodium: 136 mmol/L (ref 135–145)
Total Bilirubin: 0.9 mg/dL (ref 0.0–1.2)
Total Protein: 5.3 g/dL — ABNORMAL LOW (ref 6.5–8.1)

## 2024-06-02 LAB — URINALYSIS, W/ REFLEX TO CULTURE (INFECTION SUSPECTED)
Bilirubin Urine: NEGATIVE
Glucose, UA: NEGATIVE mg/dL
Hgb urine dipstick: NEGATIVE
Ketones, ur: 5 mg/dL — AB
Nitrite: NEGATIVE
Protein, ur: 30 mg/dL — AB
Specific Gravity, Urine: 1.019 (ref 1.005–1.030)
WBC, UA: >50 WBC/hpf (ref 0–5)
pH: 5 (ref 5.0–8.0)

## 2024-06-02 LAB — CBC
HCT: 32.5 % — ABNORMAL LOW (ref 36.0–46.0)
Hemoglobin: 10.5 g/dL — ABNORMAL LOW (ref 12.0–15.0)
MCH: 30.5 pg (ref 26.0–34.0)
MCHC: 32.3 g/dL (ref 30.0–36.0)
MCV: 94.5 fL (ref 80.0–100.0)
Platelets: 228 K/uL (ref 150–400)
RBC: 3.44 MIL/uL — ABNORMAL LOW (ref 3.87–5.11)
RDW: 18.9 % — ABNORMAL HIGH (ref 11.5–15.5)
WBC: 9.3 K/uL (ref 4.0–10.5)
nRBC: 0 % (ref 0.0–0.2)

## 2024-06-02 LAB — PROTIME-INR
INR: 1.6 — ABNORMAL HIGH (ref 0.8–1.2)
Prothrombin Time: 20 s — ABNORMAL HIGH (ref 11.4–15.2)

## 2024-06-02 LAB — MAGNESIUM: Magnesium: 1.8 mg/dL (ref 1.7–2.4)

## 2024-06-02 LAB — STREP PNEUMONIAE URINARY ANTIGEN: Strep Pneumo Urinary Antigen: NEGATIVE

## 2024-06-02 MED ORDER — OXYCODONE HCL 5 MG PO TABS
5.0000 mg | ORAL_TABLET | ORAL | Status: AC | PRN
  Administered 2024-06-02 – 2024-06-03 (×3): 5 mg via ORAL
  Filled 2024-06-02 (×3): qty 1

## 2024-06-02 MED ORDER — ALBUTEROL SULFATE (2.5 MG/3ML) 0.083% IN NEBU: 3.0000 mL | INHALATION_SOLUTION | Freq: Four times a day (QID) | RESPIRATORY_TRACT | Status: DC | PRN

## 2024-06-02 MED ORDER — BUDESON-GLYCOPYRROL-FORMOTEROL 160-9-4.8 MCG/ACT IN AERO
2.0000 | INHALATION_SPRAY | Freq: Two times a day (BID) | RESPIRATORY_TRACT | Status: DC
  Administered 2024-06-02 – 2024-06-05 (×5): 2 via RESPIRATORY_TRACT
  Filled 2024-06-02: qty 5.9

## 2024-06-02 MED ORDER — HYDRALAZINE HCL 20 MG/ML IJ SOLN: 5.0000 mg | Freq: Four times a day (QID) | INTRAMUSCULAR | Status: DC | PRN

## 2024-06-02 MED ORDER — POTASSIUM CHLORIDE CRYS ER 20 MEQ PO TBCR
40.0000 meq | EXTENDED_RELEASE_TABLET | Freq: Two times a day (BID) | ORAL | Status: DC
  Filled 2024-06-02: qty 2

## 2024-06-02 MED ORDER — HYDRALAZINE HCL 25 MG PO TABS
25.0000 mg | ORAL_TABLET | Freq: Three times a day (TID) | ORAL | Status: DC
  Administered 2024-06-02 – 2024-06-03 (×5): 25 mg via ORAL
  Filled 2024-06-02 (×5): qty 1

## 2024-06-02 MED ORDER — POTASSIUM CHLORIDE 20 MEQ PO PACK
40.0000 meq | PACK | Freq: Two times a day (BID) | ORAL | Status: DC
  Administered 2024-06-02: 40 meq via ORAL
  Filled 2024-06-02 (×2): qty 2

## 2024-06-02 MED ORDER — ADULT MULTIVITAMIN W/MINERALS CH
1.0000 | ORAL_TABLET | Freq: Every day | ORAL | Status: DC
  Administered 2024-06-02 – 2024-06-05 (×4): 1 via ORAL
  Filled 2024-06-02 (×4): qty 1

## 2024-06-02 MED ORDER — VANCOMYCIN HCL 500 MG/100ML IV SOLN
500.0000 mg | INTRAVENOUS | Status: DC
  Administered 2024-06-02: 500 mg via INTRAVENOUS
  Filled 2024-06-02 (×2): qty 100

## 2024-06-02 MED ORDER — POTASSIUM CHLORIDE 20 MEQ PO PACK
40.0000 meq | PACK | Freq: Once | ORAL | Status: AC
  Administered 2024-06-02: 40 meq via ORAL
  Filled 2024-06-02: qty 2

## 2024-06-02 MED ORDER — TORSEMIDE 20 MG PO TABS: 40.0000 mg | ORAL_TABLET | Freq: Every day | ORAL | Status: DC | PRN

## 2024-06-02 MED ORDER — ALUM & MAG HYDROXIDE-SIMETH 200-200-20 MG/5ML PO SUSP: 30.0000 mL | Freq: Four times a day (QID) | ORAL | Status: DC | PRN

## 2024-06-02 MED ORDER — VITAMIN C 500 MG PO TABS
500.0000 mg | ORAL_TABLET | Freq: Every day | ORAL | Status: DC
  Administered 2024-06-02 – 2024-06-05 (×4): 500 mg via ORAL
  Filled 2024-06-02 (×4): qty 1

## 2024-06-02 MED ORDER — GUAIFENESIN-DM 100-10 MG/5ML PO SYRP
10.0000 mL | ORAL_SOLUTION | Freq: Four times a day (QID) | ORAL | Status: DC | PRN
  Administered 2024-06-03: 10 mL via ORAL
  Filled 2024-06-02: qty 10

## 2024-06-02 MED ORDER — VITAMIN D 25 MCG (1000 UNIT) PO TABS
1000.0000 [IU] | ORAL_TABLET | Freq: Every day | ORAL | Status: DC
  Administered 2024-06-02 – 2024-06-05 (×4): 1000 [IU] via ORAL
  Filled 2024-06-02 (×4): qty 1

## 2024-06-02 NOTE — Progress Notes (Signed)
 OT Cancellation Note  Patient Details Name: MAKAYLYNN BONILLAS MRN: [id number] DOB: 1933/06/13   Cancelled Treatment:    Reason Eval/Treat Not Completed: Pain limiting ability to participate;Fatigue/lethargy limiting ability to participate. Pt received in bed, reporting 9/10 abdominal pain and fatigue. OT offers repositioning, pain meds and edu on benefits of mobility. Pt continues to decline, stating she will maybe do it tomorrow. OT will continue to follow and attempt 1x more time to perform OT evaluation. RN informed of request of pain meds, pt edu on how to use call bell.   Sarvesh Meddaugh L. Manish Ruggiero, OTR/L  06/02/24, 2:23 PM

## 2024-06-02 NOTE — Progress Notes (Signed)
 PT Cancellation Note  Patient Details Name: Leslie Parker MRN: [id number] DOB: August 10, 1932   Cancelled Treatment:    Reason Eval/Treat Not Completed: Patient declined to participate with PT evaluation this date due to general fatigue, abdominal pain, and I'm getting ready for my surgery this morning.  Nursing notified.  Pt given encouragement and education regarding benefits of mobility but continued to decline.  Will attempt to see pt at a future date/time as medically appropriate.       CHARM Glendia Bertin PT, DPT 06/02/24, 10:04 AM

## 2024-06-02 NOTE — Plan of Care (Signed)
  Problem: Activity: Goal: Risk for activity intolerance will decrease Outcome: Progressing   Problem: Coping: Goal: Level of anxiety will decrease Outcome: Progressing   Problem: Pain Managment: Goal: General experience of comfort will improve and/or be controlled Outcome: Progressing   Problem: Safety: Goal: Ability to remain free from injury will improve Outcome: Progressing   Problem: Respiratory: Goal: Ability to maintain adequate ventilation will improve Outcome: Progressing

## 2024-06-02 NOTE — Assessment & Plan Note (Signed)
 Not in acute exacerbation

## 2024-06-02 NOTE — Assessment & Plan Note (Signed)
 Carvedilol  3.125 mg p.o. twice daily Hydralazine  5 mg IV every 6 hours as needed for SBP greater 170, 5 days ordered

## 2024-06-02 NOTE — Assessment & Plan Note (Signed)
 -  Levothyroxine 50 mcg daily resumed

## 2024-06-02 NOTE — Progress Notes (Signed)
 OT Cancellation Note  Patient Details Name: Leslie Parker MRN: [id number] DOB: 05/12/1933   Cancelled Treatment:    Reason Eval/Treat Not Completed: Fatigue/lethargy limiting ability to participate;Patient declined, no reason specified. Discussed with PT - pt currently declining participation in therapy evals due to general fatigue and pain. OT will allow pt to rest this morning, and attempt this afternoon.   [name] L. Cutberto Winfree, OTR/L  06/02/24, 10:22 AM

## 2024-06-02 NOTE — Plan of Care (Signed)
   Problem: Education: Goal: Knowledge of General Education information will improve Description: Including pain rating scale, medication(s)/side effects and non-pharmacologic comfort measures Outcome: Progressing   Problem: Clinical Measurements: Goal: Respiratory complications will improve Outcome: Progressing

## 2024-06-02 NOTE — Assessment & Plan Note (Addendum)
 Continue with azithromycin  500 mg IV daily, ceftriaxone  2 g IV daily, and vancomycin  per pharmacy Incentive spirometry, flutter valve every 2 hours while awake  Legionella urine antigen, MRSA by PCR, strep pneumoniae urinary antigen has been ordered and pending collection at this time

## 2024-06-02 NOTE — Assessment & Plan Note (Addendum)
 Not in acute exacerbation

## 2024-06-02 NOTE — Consult Note (Signed)
 NAME: Leslie Parker  DOB: 05-14-1933  MRN: [id number]  Date/Time: 06/02/2024 6:09 PM  REQUESTING PROVIDER: [employer] Subjective:  REASON FOR CONSULT: Enterococcus bacteremia Patient is a limited historian.   Has had 16 hospitalizations this year  Leslie Parker is a 89 y.o. with a history of pacemaker, A-fib, bilateral total knee arthroplasty, fracture of femur left, total hip revision Patient was brought in by EMS from [facility] for shortness of breath.  She was complaining of feeling cold congested and having nausea which began almost 5 days ago When EMS arrived the blood pressure was 189/107 and the Glasgow Coma Scale was 15.  Her temperature was 98.4  06/01/24 06:06  BP 173/81 !  Temp 99.7 F (37.6 C)  Pulse Rate 81  Resp 24 !  SpO2 94 %    Latest Reference Range & Units 06/01/24 06:15  WBC 4.0 - 10.5 K/uL 8.2  Hemoglobin 12.0 - 15.0 g/dL 88.5 (L)  HCT 63.9 - 53.9 % 36.0  Platelets 150 - 400 K/uL 248  Creatinine 0.44 - 1.00 mg/dL 7.63 (H)  Chest x-ray showed bibasilar collapse consolidation with small to moderate bilateral pleural effusion. Blood culture was sent Patient was started on vancomycin , cefepime  and azithromycin .  The cefepime  was later changed to ceftriaxone . I am seeing the patient for Enterococcus bacteremia.  Past Medical History:  Diagnosis Date   Acute kidney injury superimposed on CKD 11/24/2022   Asthma    Atrial fibrillation with RVR (HCC)    Cancer (HCC)    Basal Cell   CHF (congestive heart failure) (HCC)    Closed left hip fracture (HCC) 01/25/2017   Closed right hip fracture (HCC) 02/13/2023   Diabetes mellitus without complication (HCC)    Femur fracture, left (HCC) 02/03/2015   Heart murmur    Hypertension    Impetigo    Osteoarthritis    Osteopenia    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   Rapid atrial fibrillation, new onset(HCC) 06/05/2022   Vomiting    can not due to surgery   Wears dentures    full upper and lower     Past Surgical History:  Procedure Laterality Date   ABDOMINAL HYSTERECTOMY     BACK SURGERY     BLADDER SURGERY     mesh   CARPAL TUNNEL RELEASE Bilateral    CATARACT EXTRACTION Right 2017   CATARACT EXTRACTION W/PHACO Left 02/06/2016   Procedure: CATARACT EXTRACTION PHACO AND INTRAOCULAR LENS PLACEMENT (IOC) left eye;  Surgeon: Donzell Arlyce Budd, MD;  Location: [facility];  Service: Ophthalmology;  Laterality: Left;  DIABETIC LEFT Cannot arrive before 9:30   CERVICAL DISC SURGERY     CHOLECYSTECTOMY     EYE SURGERY Bilateral    Cataract Extraction with IOL   FEMUR IM NAIL Left 02/04/2015   Procedure: INTRAMEDULLARY (IM) NAIL FEMORAL;  Surgeon: Franky Cranker, MD;  Location: [facility];  Service: Orthopedics;  Laterality: Left;   HARDWARE REMOVAL Left 01/17/2017   Procedure: HARDWARE REMOVAL;  Surgeon: Cranker Franky, MD;  Location: [facility];  Service: Orthopedics;  Laterality: Left;   HERNIA REPAIR  2014   esophageal and gastric mesh. patient unable to throw up d/t mesh   HIP ARTHROPLASTY Left 01/26/2017   Procedure: ARTHROPLASTY BIPOLAR HIP (HEMIARTHROPLASTY) removal hardware left hip;  Surgeon: Cleotilde Barrio, MD;  Location: [facility];  Service: Orthopedics;  Laterality: Left;   INTRAMEDULLARY (IM) NAIL INTERTROCHANTERIC Right 02/13/2023   Procedure: INTRAMEDULLARY (IM) NAIL INTERTROCHANTERIC;  Surgeon: Edie Norleen PARAS,  MD;  Location: [facility];  Service: Orthopedics;  Laterality: Right;   JOINT REPLACEMENT Bilateral    knees   PACEMAKER IMPLANT N/A 11/15/2022   Procedure: PACEMAKER IMPLANT;  Surgeon: Ammon Blunt, MD;  Location: [facility];  Service: Cardiovascular;  Laterality: N/A;   PACEMAKER IMPLANT N/A 11/28/2022   Procedure: PACEMAKER IMPLANT;  Surgeon: Ammon Blunt, MD;  Location: [facility];  Service: Cardiovascular;  Laterality: N/A;  Lead reposition   REPLACEMENT TOTAL KNEE BILATERAL Bilateral [id number]   SHOULDER ARTHROSCOPY  WITH OPEN ROTATOR CUFF REPAIR AND DISTAL CLAVICLE ACROMINECTOMY Left 10/25/2016   Procedure: SHOULDER ARTHROSCOPY WITH OPEN ROTATOR CUFF REPAIR AND DISTAL CLAVICLE ACROMINECTOMY;  Surgeon: Franky Cranker, MD;  Location: [facility];  Service: Orthopedics;  Laterality: Left;   THYROID  SURGERY     goiter removed   TOTAL HIP REVISION Left 12/02/2017   Procedure: TOTAL HIP REVISION;  Surgeon: Leora Lynwood SAUNDERS, MD;  Location: [facility];  Service: Orthopedics;  Laterality: Left;   TOTAL SHOULDER REPLACEMENT Right 2012    Social History   Socioeconomic History   Marital status: Divorced    Spouse name: Not on file   Number of children: 4   Years of education: Not on file   Highest education level: 12th grade  Occupational History   Occupation: Retitred/Disability  Tobacco Use   Smoking status: Never   Smokeless tobacco: Never  Vaping Use   Vaping status: Never Used  Substance and Sexual Activity   Alcohol use: No    Alcohol/week: 0.0 standard drinks of alcohol   Drug use: No   Sexual activity: Not Currently  Other Topics Concern   Not on file  Social History Narrative   Not on file   Social Drivers of Health   Financial Resource Strain: Low Risk  (01/13/2024)   Received from [facility]   Overall Financial Resource Strain (CARDIA)    Difficulty of Paying Living Expenses: Not hard at all  Food Insecurity: No Food Insecurity (06/01/2024)   Hunger Vital Sign    Worried About Running Out of Food in the Last Year: Never true    Ran Out of Food in the Last Year: Never true  Transportation Needs: No Transportation Needs (06/01/2024)   PRAPARE - Administrator, Civil Service (Medical): No    Lack of Transportation (Non-Medical): No  Physical Activity: Not on file  Stress: Not on file  Social Connections: Moderately Isolated (06/01/2024)   Social Connection and Isolation Panel    Frequency of Communication with Friends and Family: More than three times a week     Frequency of Social Gatherings with Friends and Family: Three times a week    Attends Religious Services: 1 to 4 times per year    Active Member of Clubs or Organizations: No    Attends Banker Meetings: Never    Marital Status: Divorced  Catering Manager Violence: Not At Risk (06/01/2024)   Humiliation, Afraid, Rape, and Kick questionnaire    Fear of Current or Ex-Partner: No    Emotionally Abused: No    Physically Abused: No    Sexually Abused: No    Family History  Problem Relation Age of Onset   Heart failure Mother    Hypertension Mother    Emphysema Father    Allergies  Allergen Reactions   Baclofen Other (See Comments)    Elevated Cr   Simvastatin Other (See Comments)    Pt denies  Elevated LFTs with simva 40mg , stopped and resolved (see MD note 11/10/13)   I? Current Facility-Administered Medications  Medication Dose Route Frequency Provider Last Rate Last Admin   acetaminophen  (TYLENOL ) tablet 650 mg  650 mg Oral Q6H PRN Roann Gouty, MD       Or   acetaminophen  (TYLENOL ) suppository 650 mg  650 mg Rectal Q6H PRN Paudel, Gouty, MD       albuterol  (PROVENTIL ) (2.5 MG/3ML) 0.083% nebulizer solution 3 mL  3 mL Inhalation Q6H PRN Cox, Amy N, DO       alum & mag hydroxide-simeth (MAALOX/MYLANTA) 200-200-20 MG/5ML suspension 30 mL  30 mL Oral Q6H PRN Cox, Amy N, DO       amiodarone  (PACERONE ) tablet 200 mg  200 mg Oral Daily Paudel, Keshab, MD   200 mg at 06/02/24 1003   apixaban  (ELIQUIS ) tablet 2.5 mg  2.5 mg Oral BID Paudel, Keshab, MD   2.5 mg at 06/02/24 1003   ascorbic acid  (VITAMIN C ) tablet 500 mg  500 mg Oral Daily Cox, Amy N, DO   500 mg at 06/02/24 1700   azithromycin  (ZITHROMAX ) 500 mg in sodium chloride  0.9 % 250 mL IVPB  500 mg Intravenous Q24H Paudel, Gouty, MD 250 mL/hr at 06/02/24 1022 500 mg at 06/02/24 1022   budesonide -glycopyrrolate -formoterol  (BREZTRI ) 160-9-4.8 MCG/ACT inhaler 2 puff  2 puff Inhalation BID Cox, Amy N, DO        carvedilol  (COREG ) tablet 3.125 mg  3.125 mg Oral BID WC Paudel, Keshab, MD   3.125 mg at 06/02/24 1659   cefTRIAXone  (ROCEPHIN ) 2 g in sodium chloride  0.9 % 100 mL IVPB  2 g Intravenous Q24H Paudel, Keshab, MD 200 mL/hr at 06/02/24 1656 2 g at 06/02/24 1656   cholecalciferol  (VITAMIN D3) 25 MCG (1000 UNIT) tablet 1,000 Units  1,000 Units Oral Daily Cox, Amy N, DO   1,000 Units at 06/02/24 1700   guaiFENesin -dextromethorphan  (ROBITUSSIN DM) 100-10 MG/5ML syrup 10 mL  10 mL Oral Q6H PRN Cox, Amy N, DO       hydrALAZINE  (APRESOLINE ) injection 5 mg  5 mg Intravenous Q6H PRN Cox, Amy N, DO       hydrALAZINE  (APRESOLINE ) tablet 25 mg  25 mg Oral TID Cox, Amy N, DO   25 mg at 06/02/24 1659   levothyroxine  (SYNTHROID ) tablet 50 mcg  50 mcg Oral Q0600 Paudel, Keshab, MD   50 mcg at 06/02/24 0513   melatonin tablet 5 mg  5 mg Oral QHS PRN Paudel, Keshab, MD       multivitamin with minerals tablet 1 tablet  1 tablet Oral Daily Cox, Amy N, DO   1 tablet at 06/02/24 1700   ondansetron  (ZOFRAN ) tablet 4 mg  4 mg Oral Q6H PRN Paudel, Keshab, MD       Or   ondansetron  (ZOFRAN ) injection 4 mg  4 mg Intravenous Q6H PRN Paudel, Keshab, MD   4 mg at 06/02/24 1035   oxyCODONE  (Oxy IR/ROXICODONE ) immediate release tablet 5 mg  5 mg Oral Q4H PRN Cox, Amy N, DO       pantoprazole  (PROTONIX ) EC tablet 40 mg  40 mg Oral BID Paudel, Keshab, MD   40 mg at 06/02/24 1002   polyethylene glycol (MIRALAX  / GLYCOLAX ) packet 17 g  17 g Oral Daily PRN Paudel, Keshab, MD   17 g at 06/02/24 0513   potassium chloride  (KLOR-CON ) packet 40 mEq  40 mEq Oral BID Niels Kayla FALCON, [facility]  40 mEq at 06/02/24 1026   torsemide  (DEMADEX ) tablet 40 mg  40 mg Oral Daily PRN Cox, Amy N, DO       vancomycin  (VANCOREADY) IVPB 500 mg/100 mL  500 mg Intravenous Q24H Dail Rankin RAMAN, RPH 100 mL/hr at 06/02/24 0823 500 mg at 06/02/24 9176     Abtx:  Anti-infectives (From admission, onward)    Start     Dose/Rate Route Frequency Ordered Stop    06/02/24 0800  vancomycin  (VANCOREADY) IVPB 500 mg/100 mL        500 mg 100 mL/hr over 60 Minutes Intravenous Every 24 hours 06/02/24 0126     06/01/24 1800  cefTRIAXone  (ROCEPHIN ) 2 g in sodium chloride  0.9 % 100 mL IVPB        2 g 200 mL/hr over 30 Minutes Intravenous Every 24 hours 06/01/24 1006 06/06/24 1759   06/01/24 1015  azithromycin  (ZITHROMAX ) 500 mg in sodium chloride  0.9 % 250 mL IVPB        500 mg 250 mL/hr over 60 Minutes Intravenous Every 24 hours 06/01/24 1006 06/06/24 1014   06/01/24 0715  ceFEPIme  (MAXIPIME ) 2 g in sodium chloride  0.9 % 100 mL IVPB        2 g 200 mL/hr over 30 Minutes Intravenous  Once 06/01/24 0702 06/01/24 0805   06/01/24 0715  vancomycin  (VANCOREADY) IVPB 1750 mg/350 mL        1,750 mg 175 mL/hr over 120 Minutes Intravenous  Once 06/01/24 0710 06/01/24 1115       REVIEW OF SYSTEMS:  Const: negative fever, negative chills, some weight loss Eyes: negative diplopia or visual changes, negative eye pain ENT: negative coryza, negative sore throat Resp: Has cough and shortness of breath. Cards: negative for chest pain, palpitations, has lower extremity edema GU: negative for frequency, dysuria and hematuria GI: Negative for abdominal pain, diarrhea, bleeding, constipation Skin: negative for rash and pruritus Heme:  easy bruising  MS: Complains of pain in both thighs, severe weakness Neurolo:negative for headaches, dizziness, vertigo, some memory problems  Psych: negative for feelings of anxiety, depression  Endocrine: negative for thyroid , diabetes Allergy/Immunology-baclofen and simvastatin Objective:  VITALS:  BP (!) 136/58 (BP Location: Right Arm)   Pulse 67   Temp 98.1 F (36.7 C)   Resp 16   Ht 5' 9 (1.753 m)   Wt 68.4 kg   SpO2 95%   BMI 22.27 kg/m   PHYSICAL EXAM:  General: Awake alert, cooperative, no distress, some memory issues.  Oriented in place person and year Head: Normocephalic, without obvious abnormality,  atraumatic. Eyes: Conjunctivae clear, anicteric sclerae. Pupils are equal ENT Nares normal. No drainage or sinus tenderness. Lips, mucosa, and tongue normal. No Thrush Neck: Supple, symmetrical, no adenopathy, thyroid : non tender no carotid bruit and no JVD. Back: No CVA tenderness. Lungs: Bilateral air entry. Few basal crackles. Heart: S1-S2 Pacemaker site intact Abdomen: Soft, non-tender,not distended. Bowel sounds normal. No masses Extremities: atraumatic, no cyanosis. No edema. No clubbing Skin: No rashes or lesions. Or bruising Lymph: Cervical, supraclavicular normal. Neurologic: Grossly non-focal Pertinent Labs Lab Results CBC    Component Value Date/Time   WBC 9.3 06/02/2024 0458   RBC 3.44 (L) 06/02/2024 0458   HGB 10.5 (L) 06/02/2024 0458   HGB 15.2 07/13/2014 1938   HCT 32.5 (L) 06/02/2024 0458   HCT 45.3 07/13/2014 1938   PLT 228 06/02/2024 0458   PLT 274 07/13/2014 1938   MCV 94.5 06/02/2024 0458   MCV 90 07/13/2014  1938   MCH 30.5 06/02/2024 0458   MCHC 32.3 06/02/2024 0458   RDW 18.9 (H) 06/02/2024 0458   RDW 12.9 07/13/2014 1938   LYMPHSABS 1.3 06/01/2024 0615   LYMPHSABS 0.8 (L) 01/17/2014 0528   MONOABS 0.6 06/01/2024 0615   MONOABS 0.1 (L) 01/17/2014 0528   EOSABS 0.0 06/01/2024 0615   EOSABS 0.0 01/17/2014 0528   BASOSABS 0.0 06/01/2024 0615   BASOSABS 0.0 01/17/2014 0528       Latest Ref Rng & Units 06/02/2024    4:58 AM 06/01/2024    6:15 AM 04/16/2024    2:06 AM  CMP  Glucose 70 - 99 mg/dL 881  881  898   BUN 8 - 23 mg/dL 64  55  71   Creatinine 0.44 - 1.00 mg/dL 7.65  7.63  6.94   Sodium 135 - 145 mmol/L 136  137  133   Potassium 3.5 - 5.1 mmol/L 3.1  3.1  3.9   Chloride 98 - 111 mmol/L 103  104  100   CO2 22 - 32 mmol/L 21  20  24    Calcium 8.9 - 10.3 mg/dL 8.2  8.4  7.7   Total Protein 6.5 - 8.1 g/dL 5.3  6.4    Total Bilirubin 0.0 - 1.2 mg/dL 0.9  1.5    Alkaline Phos 38 - 126 U/L 103  125    AST 15 - 41 U/L 41  51    ALT 0 - 44  U/L 36  42        Microbiology: Recent Results (from the past 240 hours)  Resp panel by RT-PCR (RSV, Flu A&B, Covid) Anterior Nasal Swab     Status: None   Collection Time: 06/01/24  6:14 AM   Specimen: Anterior Nasal Swab  Result Value Ref Range Status   SARS Coronavirus 2 by RT PCR NEGATIVE NEGATIVE Final    Comment: (NOTE) SARS-CoV-2 target nucleic acids are NOT DETECTED.  The SARS-CoV-2 RNA is generally detectable in upper respiratory specimens during the acute phase of infection. The lowest concentration of SARS-CoV-2 viral copies this assay can detect is 138 copies/mL. A negative result does not preclude SARS-Cov-2 infection and should not be used as the sole basis for treatment or other patient management decisions. A negative result may occur with  improper specimen collection/handling, submission of specimen other than nasopharyngeal swab, presence of viral mutation(s) within the areas targeted by this assay, and inadequate number of viral copies(<138 copies/mL). A negative result must be combined with clinical observations, patient history, and epidemiological information. The expected result is Negative.  Fact Sheet for Patients:  bloggercourse.com  Fact Sheet for Healthcare Providers:  seriousbroker.it  This test is no t yet approved or cleared by the [place]  FDA and  has been authorized for detection and/or diagnosis of SARS-CoV-2 by FDA under an Emergency Use Authorization (EUA). This EUA will remain  in effect (meaning this test can be used) for the duration of the COVID-19 declaration under Section 564(b)(1) of the Act, 21 U.S.C.section 360bbb-3(b)(1), unless the authorization is terminated  or revoked sooner.       Influenza A by PCR NEGATIVE NEGATIVE Final   Influenza B by PCR NEGATIVE NEGATIVE Final    Comment: (NOTE) The Xpert Xpress SARS-CoV-2/FLU/RSV plus assay is intended as an aid in the  diagnosis of influenza from Nasopharyngeal swab specimens and should not be used as a sole basis for treatment. Nasal washings and aspirates are unacceptable for Xpert Xpress  SARS-CoV-2/FLU/RSV testing.  Fact Sheet for Patients: bloggercourse.com  Fact Sheet for Healthcare Providers: seriousbroker.it  This test is not yet approved or cleared by the [place]  FDA and has been authorized for detection and/or diagnosis of SARS-CoV-2 by FDA under an Emergency Use Authorization (EUA). This EUA will remain in effect (meaning this test can be used) for the duration of the COVID-19 declaration under Section 564(b)(1) of the Act, 21 U.S.C. section 360bbb-3(b)(1), unless the authorization is terminated or revoked.     Resp Syncytial Virus by PCR NEGATIVE NEGATIVE Final    Comment: (NOTE) Fact Sheet for Patients: bloggercourse.com  Fact Sheet for Healthcare Providers: seriousbroker.it  This test is not yet approved or cleared by the [place]  FDA and has been authorized for detection and/or diagnosis of SARS-CoV-2 by FDA under an Emergency Use Authorization (EUA). This EUA will remain in effect (meaning this test can be used) for the duration of the COVID-19 declaration under Section 564(b)(1) of the Act, 21 U.S.C. section 360bbb-3(b)(1), unless the authorization is terminated or revoked.  Performed at [facility], [address] [place], [place] [address]   Blood culture (single)     Status: None (Preliminary result)   Collection Time: 06/01/24  6:15 AM   Specimen: BLOOD  Result Value Ref Range Status   Specimen Description   Final    BLOOD BLOOD RIGHT ARM Performed at [facility], [address] [place], [place] [address]    Special Requests   Final    BOTTLES DRAWN AEROBIC AND ANAEROBIC Blood Culture adequate volume Performed at [facility], [address] [place], [place] [address]    Culture  Setup Time   Final    GRAM POSITIVE COCCI IN BOTH AEROBIC AND ANAEROBIC BOTTLES CRITICAL RESULT CALLED TO, READ BACK BY AND VERIFIED WITH:  NATHAN B. AT 2240 06/01/24 LL Performed at [facility], [address] [place], [place] [address]    Culture GRAM POSITIVE COCCI  Final   Report Status PENDING  Incomplete  Blood Culture ID Panel (Reflexed)     Status: Abnormal   Collection Time: 06/01/24  6:15 AM  Result Value Ref Range Status   Enterococcus faecalis NOT DETECTED NOT DETECTED Final   Enterococcus Faecium DETECTED (A) NOT DETECTED Final    Comment: CRITICAL RESULT CALLED TO, READ BACK BY AND VERIFIED WITH: NATHAN B. [id number] LRL    Listeria monocytogenes NOT DETECTED NOT DETECTED Final   Staphylococcus species NOT DETECTED NOT DETECTED Final   Staphylococcus aureus (BCID) NOT DETECTED NOT DETECTED Final   Staphylococcus epidermidis NOT DETECTED NOT DETECTED Final   Staphylococcus lugdunensis NOT DETECTED NOT DETECTED Final   Streptococcus species NOT DETECTED NOT DETECTED Final   Streptococcus agalactiae NOT DETECTED NOT DETECTED Final   Streptococcus pneumoniae NOT DETECTED NOT DETECTED Final   Streptococcus pyogenes NOT DETECTED NOT DETECTED Final   A.calcoaceticus-baumannii NOT DETECTED NOT DETECTED Final   Bacteroides fragilis NOT DETECTED NOT DETECTED Final   Enterobacterales NOT DETECTED NOT DETECTED Final   Enterobacter cloacae complex NOT DETECTED NOT DETECTED Final   Escherichia coli NOT DETECTED NOT DETECTED Final   Klebsiella aerogenes NOT DETECTED NOT DETECTED Final   Klebsiella oxytoca NOT DETECTED NOT DETECTED Final   Klebsiella pneumoniae NOT DETECTED NOT DETECTED Final   Proteus species NOT DETECTED NOT DETECTED Final   Salmonella species NOT DETECTED NOT DETECTED Final   Serratia marcescens NOT DETECTED NOT DETECTED Final   Haemophilus influenzae NOT  DETECTED NOT DETECTED Final    Neisseria meningitidis NOT DETECTED NOT DETECTED Final   Pseudomonas aeruginosa NOT DETECTED NOT DETECTED Final   Stenotrophomonas maltophilia NOT DETECTED NOT DETECTED Final   Candida albicans NOT DETECTED NOT DETECTED Final   Candida auris NOT DETECTED NOT DETECTED Final   Candida glabrata NOT DETECTED NOT DETECTED Final   Candida krusei NOT DETECTED NOT DETECTED Final   Candida parapsilosis NOT DETECTED NOT DETECTED Final   Candida tropicalis NOT DETECTED NOT DETECTED Final   Cryptococcus neoformans/gattii NOT DETECTED NOT DETECTED Final   Vancomycin  resistance NOT DETECTED NOT DETECTED Final    Comment: Performed at [facility], [address] [place], [place] [address]  Blood culture (single)     Status: None (Preliminary result)   Collection Time: 06/01/24  7:34 AM   Specimen: BLOOD  Result Value Ref Range Status   Specimen Description BLOOD RIGHT ANTECUBITAL  Final   Special Requests   Final    BOTTLES DRAWN AEROBIC AND ANAEROBIC Blood Culture adequate volume   Culture  Setup Time   Final    GRAM POSITIVE COCCI IN BOTH AEROBIC AND ANAEROBIC BOTTLES CRITICAL VALUE NOTED.  VALUE IS CONSISTENT WITH PREVIOUSLY REPORTED AND CALLED VALUE.    Culture   Final    NO GROWTH 1 DAY Performed at [facility], [address] [place], [place] [address]    Report Status PENDING  Incomplete      IMAGING RESULTS: Chest x-ray reviewed personally Pacemaker in place Bibasilar consolidation/atelectasis Bilateral pleural effusion right worse than left   I have personally reviewed the films ? EKG-    Impression/Recommendation ? Enterococcus Faecium bacteremia in a patient with a pacemaker Concern for endocarditis/Device infection. Needs 2D echo TEE Until susceptibilities available continue vancomycin   Bilateral pleural effusion and bibasilar consolidation likely congestive heart failure Patient is also on ceftriaxone  and azithromycin  Will very likely stop  both Chronic A-fib On amiodarone  and Eliquis   CKD  Hypertension Diabetes mellitus management as per primary team  Hypothyroidism on Synthroid .  Frailty  Anemia  History of bilateral TKA  History of bilateral fracture of the femur Has pain in her thighs Need PT to assess her and check her mobility.  Discussed the management with the patient in detail This consult involve complex antimicrobial management. ? I have personally spent  -75--minutes involved in face-to-face and non-face-to-face activities for this patient on the day of the visit. Professional time spent includes the following activities: Preparing to see the patient (review of tests), Obtaining and/or reviewing separately obtained history (admission/discharge record), Performing a medically appropriate examination and/or evaluation , Ordering medications/tests/procedures, referring and communicating with other health care professionals, Documenting clinical information in the EMR, Independently interpreting results (not separately reported), Communicating results to the patient/f, Counseling and educating the patient/ and Care coordination (not separately reported).    ________________________________________________  Note:  This document was prepared using Conservation officer, historic buildings and may include unintentional dictation errors.

## 2024-06-02 NOTE — Progress Notes (Signed)
 PROGRESS NOTE  Leslie Parker  FMW:[id number] DOB: 1932-11-27 DOA: 06/01/2024 PCP: Sherial Bail, MD   Ms. Leslie Parker is an 89 year old female with history of atrial fibrillation amiodarone  and apixaban , heart failure, pacemaker status, hypertension, GERD, hypothyroid, CKD stage IV, who presents for chief concerns of cough, congestion, shortness of breath.  Vitals and labs on day 1 of hospital service tof 97.4, rr of 68, hr of 62, blood pressure 137/59, SpO2 of 95% on room air.  Serum sodium is 136, potassium 3.1, chloride 103, bicarb 21, BUN of 64, serum creatinine of 2.34, eGFR 19, nonfasting blood glucose 119, WBC 9.3, hemoglobin 10.5, platelets of 228.  Lactic acid in the ED was 1.1 and on repeat is 1.0.  CK is 30. HS troponin was initially 46 and on repeat was 45, BNP was markedly elevated at greater than 4500.  Blood culture was positive for Enterococcus VCM.  Legionella, MRSA, strep pneumonia has been ordered and pending collection.  Chest x-ray showed bibasilar consolidation.    Admitting treatment: Azithromycin  500 mg IV, ceftriaxone  2 g IV daily, vancomycin  per pharmacy, oxycodone  5 mg as needed, and home torsemide  was held on admission.  Assessment & Plan:   Principal Problem:   Bacteremia due to Enterococcus Active Problems:   Community acquired pneumonia   Diabetes mellitus without complication (HCC)   Chronic a-fib (HCC)   Chronic diastolic CHF (congestive heart failure) (HCC)   Essential hypertension   COPD (chronic obstructive pulmonary disease) (HCC)   CKD (chronic kidney disease) stage 4, GFR 15-29 ml/min (HCC)   Hypothyroidism   Assessment and Plan:  * Bacteremia due to Enterococcus Secondary to Enterococcus Faecium on blood culture ID panel Blood culture stain was positive for gram-positive cocci Continue vancomycin  per pharmacy Complete echo ordered  Community acquired pneumonia Continue with azithromycin  500 mg IV daily, ceftriaxone  2 g IV daily, and  vancomycin  per pharmacy Incentive spirometry, flutter valve every 2 hours while awake  Legionella urine antigen, MRSA by PCR, strep pneumoniae urinary antigen has been ordered and pending collection at this time  Chronic diastolic CHF (congestive heart failure) (HCC) Not in acute exacerbation  Chronic a-fib (HCC) Continue home amiodarone  200 mg daily, carvedilol  3.125 mg p.o. twice daily with meals, apixaban  2.5 mg p.o. twice daily  Hypothyroidism Levothyroxine  50 mcg daily resumed  CKD (chronic kidney disease) stage 4, GFR 15-29 ml/min (HCC) At baseline  COPD (chronic obstructive pulmonary disease) (HCC) Not in acute exacerbation  Essential hypertension Carvedilol  3.125 mg p.o. twice daily Hydralazine  5 mg IV every 6 hours as needed for SBP greater 170, 5 days ordered  DVT prophylaxis: Apixaban  2.5 mg p.o. twice daily Code Status: Full code Family Communication: No Disposition Plan: Pending clinical course, pending complete echo Level of care: Telemetry  Consultants:  PT, OT, TOC  Procedures:  None at this time  Antimicrobials: Vancomycin  per pharmacy, ceftriaxone  2 g IV daily, azithromycin  500 mg IV daily  Subjective:  At bedside, patient awake alert and oriented to self, age, location, current calendar year.  She reports she is feeling little bit better.  She reports she understands that she has bacteria infection in her blood.  I phoned the patient that a complete echo has been ordered to ensure no vegetation.  Patient endorses understanding and compliance  Objective: Vitals:   06/01/24 1532 06/01/24 2029 06/02/24 0515 06/02/24 0738  BP: (!) 109/44 (!) 118/52 (!) 137/59 (!) 146/66  Pulse: 63 63 62 66  Resp: 17 16 16  17  Temp: 97.7 F (36.5 C) 97.7 F (36.5 C) (!) 97.4 F (36.3 C) 97.8 F (36.6 C)  TempSrc:    Oral  SpO2: 93% 95% 95% 96%  Weight:   68.4 kg   Height:        Intake/Output Summary (Last 24 hours) at 06/02/2024 1514 Last data filed at  06/02/2024 0515 Gross per 24 hour  Intake 830.07 ml  Output 450 ml  Net 380.07 ml   Filed Weights   06/01/24 0609 06/02/24 0515  Weight: 78.2 kg 68.4 kg   Examination:  General exam: Appears calm and comfortable  Respiratory system: Clear to auscultation. Respiratory effort normal. Cardiovascular system: S1 & S2 heard, RRR. No JVD, murmurs, rubs, gallops or clicks. No pedal edema. Gastrointestinal system: Abdomen is nondistended, soft and nontender. No organomegaly or masses felt. Normal bowel sounds heard. Central nervous system: Alert and oriented. No focal neurological deficits. Extremities: Symmetric 5 x 5 power. Skin: No rashes, lesions or ulcers Psychiatry: Judgement and insight appear normal. Mood & affect appropriate.   Data Reviewed: I have personally reviewed following labs and imaging studies  CBC: Recent Labs  Lab 06/01/24 0615 06/02/24 0458  WBC 8.2 9.3  NEUTROABS 6.1  --   HGB 11.4* 10.5*  HCT 36.0 32.5*  MCV 94.7 94.5  PLT 248 228   Basic Metabolic Panel: Recent Labs  Lab 06/01/24 0614 06/01/24 0615 06/02/24 0458  NA  --  137 136  K  --  3.1* 3.1*  CL  --  104 103  CO2  --  20* 21*  GLUCOSE  --  118* 118*  BUN  --  55* 64*  CREATININE  --  2.36* 2.34*  CALCIUM  --  8.4* 8.2*  MG 1.7  --  1.8   GFR: Estimated Creatinine Clearance: 17 mL/min (A) (by C-G formula based on SCr of 2.34 mg/dL (H)).  Liver Function Tests: Recent Labs  Lab 06/01/24 0615 06/02/24 0458  AST 51* 41  ALT 42 36  ALKPHOS 125 103  BILITOT 1.5* 0.9  PROT 6.4* 5.3*  ALBUMIN  2.5* 2.2*   Coagulation Profile: Recent Labs  Lab 06/02/24 0458  INR 1.6*   Cardiac Enzymes: Recent Labs  Lab 06/01/24 0614  CKTOTAL 30*   CBG: Recent Labs  Lab 06/01/24 2352  GLUCAP 156*   Sepsis Labs: Recent Labs  Lab 06/01/24 0615 06/01/24 0916 06/01/24 1224  PROCALCITON 0.19 0.18  --   LATICACIDVEN 1.1  --  1.0   Recent Results (from the past 240 hours)  Resp panel by  RT-PCR (RSV, Flu A&B, Covid) Anterior Nasal Swab     Status: None   Collection Time: 06/01/24  6:14 AM   Specimen: Anterior Nasal Swab  Result Value Ref Range Status   SARS Coronavirus 2 by RT PCR NEGATIVE NEGATIVE Final    Comment: (NOTE) SARS-CoV-2 target nucleic acids are NOT DETECTED.  The SARS-CoV-2 RNA is generally detectable in upper respiratory specimens during the acute phase of infection. The lowest concentration of SARS-CoV-2 viral copies this assay can detect is 138 copies/mL. A negative result does not preclude SARS-Cov-2 infection and should not be used as the sole basis for treatment or other patient management decisions. A negative result may occur with  improper specimen collection/handling, submission of specimen other than nasopharyngeal swab, presence of viral mutation(s) within the areas targeted by this assay, and inadequate number of viral copies(<138 copies/mL). A negative result must be combined with clinical observations, patient history, and epidemiological  information. The expected result is Negative.  Fact Sheet for Patients:  bloggercourse.com  Fact Sheet for Healthcare Providers:  seriousbroker.it  This test is no t yet approved or cleared by the [place]  FDA and  has been authorized for detection and/or diagnosis of SARS-CoV-2 by FDA under an Emergency Use Authorization (EUA). This EUA will remain  in effect (meaning this test can be used) for the duration of the COVID-19 declaration under Section 564(b)(1) of the Act, 21 U.S.C.section 360bbb-3(b)(1), unless the authorization is terminated  or revoked sooner.       Influenza A by PCR NEGATIVE NEGATIVE Final   Influenza B by PCR NEGATIVE NEGATIVE Final    Comment: (NOTE) The Xpert Xpress SARS-CoV-2/FLU/RSV plus assay is intended as an aid in the diagnosis of influenza from Nasopharyngeal swab specimens and should not be used as a sole basis  for treatment. Nasal washings and aspirates are unacceptable for Xpert Xpress SARS-CoV-2/FLU/RSV testing.  Fact Sheet for Patients: bloggercourse.com  Fact Sheet for Healthcare Providers: seriousbroker.it  This test is not yet approved or cleared by the [place]  FDA and has been authorized for detection and/or diagnosis of SARS-CoV-2 by FDA under an Emergency Use Authorization (EUA). This EUA will remain in effect (meaning this test can be used) for the duration of the COVID-19 declaration under Section 564(b)(1) of the Act, 21 U.S.C. section 360bbb-3(b)(1), unless the authorization is terminated or revoked.     Resp Syncytial Virus by PCR NEGATIVE NEGATIVE Final    Comment: (NOTE) Fact Sheet for Patients: bloggercourse.com  Fact Sheet for Healthcare Providers: seriousbroker.it  This test is not yet approved or cleared by the [place]  FDA and has been authorized for detection and/or diagnosis of SARS-CoV-2 by FDA under an Emergency Use Authorization (EUA). This EUA will remain in effect (meaning this test can be used) for the duration of the COVID-19 declaration under Section 564(b)(1) of the Act, 21 U.S.C. section 360bbb-3(b)(1), unless the authorization is terminated or revoked.  Performed at [facility], [address] [place], [place] [address]   Blood culture (single)     Status: None (Preliminary result)   Collection Time: 06/01/24  6:15 AM   Specimen: BLOOD  Result Value Ref Range Status   Specimen Description   Final    BLOOD BLOOD RIGHT ARM Performed at [facility], [address] [place], [place] [address]    Special Requests   Final    BOTTLES DRAWN AEROBIC AND ANAEROBIC Blood Culture adequate volume Performed at [facility], [address] [place], [place] [address]    Culture  Setup Time   Final    GRAM POSITIVE  COCCI IN BOTH AEROBIC AND ANAEROBIC BOTTLES CRITICAL RESULT CALLED TO, READ BACK BY AND VERIFIED WITH:  NATHAN B. AT 2240 06/01/24 LL Performed at [facility], [address] [place], [place] [address]    Culture GRAM POSITIVE COCCI  Final   Report Status PENDING  Incomplete  Blood Culture ID Panel (Reflexed)     Status: Abnormal   Collection Time: 06/01/24  6:15 AM  Result Value Ref Range Status   Enterococcus faecalis NOT DETECTED NOT DETECTED Final   Enterococcus Faecium DETECTED (A) NOT DETECTED Final    Comment: CRITICAL RESULT CALLED TO, READ BACK BY AND VERIFIED WITH: NATHAN B. [phone] [id number] LRL    Listeria monocytogenes NOT DETECTED NOT DETECTED Final   Staphylococcus species NOT DETECTED NOT DETECTED Final   Staphylococcus aureus (BCID) NOT  DETECTED NOT DETECTED Final   Staphylococcus epidermidis NOT DETECTED NOT DETECTED Final   Staphylococcus lugdunensis NOT DETECTED NOT DETECTED Final   Streptococcus species NOT DETECTED NOT DETECTED Final   Streptococcus agalactiae NOT DETECTED NOT DETECTED Final   Streptococcus pneumoniae NOT DETECTED NOT DETECTED Final   Streptococcus pyogenes NOT DETECTED NOT DETECTED Final   A.calcoaceticus-baumannii NOT DETECTED NOT DETECTED Final   Bacteroides fragilis NOT DETECTED NOT DETECTED Final   Enterobacterales NOT DETECTED NOT DETECTED Final   Enterobacter cloacae complex NOT DETECTED NOT DETECTED Final   Escherichia coli NOT DETECTED NOT DETECTED Final   Klebsiella aerogenes NOT DETECTED NOT DETECTED Final   Klebsiella oxytoca NOT DETECTED NOT DETECTED Final   Klebsiella pneumoniae NOT DETECTED NOT DETECTED Final   Proteus species NOT DETECTED NOT DETECTED Final   Salmonella species NOT DETECTED NOT DETECTED Final   Serratia marcescens NOT DETECTED NOT DETECTED Final   Haemophilus influenzae NOT DETECTED NOT DETECTED Final   Neisseria meningitidis NOT DETECTED NOT DETECTED Final   Pseudomonas aeruginosa NOT DETECTED NOT  DETECTED Final   Stenotrophomonas maltophilia NOT DETECTED NOT DETECTED Final   Candida albicans NOT DETECTED NOT DETECTED Final   Candida auris NOT DETECTED NOT DETECTED Final   Candida glabrata NOT DETECTED NOT DETECTED Final   Candida krusei NOT DETECTED NOT DETECTED Final   Candida parapsilosis NOT DETECTED NOT DETECTED Final   Candida tropicalis NOT DETECTED NOT DETECTED Final   Cryptococcus neoformans/gattii NOT DETECTED NOT DETECTED Final   Vancomycin  resistance NOT DETECTED NOT DETECTED Final    Comment: Performed at [facility], [address] [place], [place] [address]  Blood culture (single)     Status: None (Preliminary result)   Collection Time: 06/01/24  7:34 AM   Specimen: BLOOD  Result Value Ref Range Status   Specimen Description BLOOD RIGHT ANTECUBITAL  Final   Special Requests   Final    BOTTLES DRAWN AEROBIC AND ANAEROBIC Blood Culture adequate volume   Culture  Setup Time   Final    GRAM POSITIVE COCCI IN BOTH AEROBIC AND ANAEROBIC BOTTLES CRITICAL VALUE NOTED.  VALUE IS CONSISTENT WITH PREVIOUSLY REPORTED AND CALLED VALUE.    Culture   Final    NO GROWTH 1 DAY Performed at [facility], [address] [place], [place] [address]    Report Status PENDING  Incomplete    Radiology Studies: DG Abd 1 View Result Date: 06/02/2024 EXAM: 1 VIEW XRAY OF THE ABDOMEN 06/02/2024 11:44:00 AM COMPARISON: 02/28/2024 CLINICAL HISTORY: Abdominal pain [id number]; [id number] Constipation [id number] FINDINGS: BOWEL: Moderately dilated loop of small bowel in the left upper quadrant, concerning for ileus or possibly small bowel obstruction. Mild amount of stool is noted throughout the nondilated colon. SOFT TISSUES: No opaque urinary calculi. BONES: No acute osseous abnormality. IMPRESSION: 1. Moderately dilated loop of small bowel in the left upper quadrant, worrisome for ileus versus small-bowel obstruction. Electronically signed by: Lynwood Seip MD 06/02/2024 11:54 AM EST  RP Workstation: [id number]   DG Chest Portable 1 View Result Date: 06/01/2024 CLINICAL DATA:  Cough and shortness of breath. EXAM: PORTABLE CHEST 1 VIEW COMPARISON:  04/13/2024 FINDINGS: The cardio pericardial silhouette is enlarged. Bibasilar collapse/consolidation with small to moderate bilateral pleural effusions, progressive in the interval. Left permanent pacemaker again noted. Telemetry leads overlie the chest. IMPRESSION: Bibasilar collapse/consolidation with small to moderate bilateral pleural effusions, progressive in the interval. Electronically Signed   By: Camellia Candle M.D.   On: 06/01/2024 06:24  Scheduled Meds:  amiodarone   200 mg Oral Daily   apixaban   2.5 mg Oral BID   ascorbic acid   500 mg Oral Daily   budesonide -glycopyrrolate -formoterol   2 puff Inhalation BID   carvedilol   3.125 mg Oral BID WC   Cholecalciferol   1,000 Units Oral Daily   hydrALAZINE   25 mg Oral See admin instructions   levothyroxine   50 mcg Oral Q0600   multivitamin with minerals  1 tablet Oral Daily   pantoprazole   40 mg Oral BID   potassium chloride   40 mEq Oral BID   Continuous Infusions:  azithromycin  500 mg (06/02/24 1022)   cefTRIAXone  (ROCEPHIN )  IV 2 g (06/01/24 1742)   vancomycin  500 mg (06/02/24 0823)    LOS: 1 day   Time spent: 50 minutes  Dr. Sherre [facility] If 7PM-7AM, please contact night-coverage 06/02/2024, 3:14 PM

## 2024-06-02 NOTE — Assessment & Plan Note (Signed)
 Continue home amiodarone  200 mg daily, carvedilol  3.125 mg p.o. twice daily with meals, apixaban  2.5 mg p.o. twice daily

## 2024-06-02 NOTE — Progress Notes (Signed)
 Pharmacy Antibiotic Note  Leslie Parker is a 89 y.o. female admitted on 06/01/2024 with bacteremia.  Pharmacy has been consulted for Vancomycin  dosing.  Plan: Pt given Vancomycin  1750 mg once. Vancomycin  500 mg IV Q 24 hrs. Goal AUC 400-550. Expected AUC: 482.2 SCr used: 2.36  Follow up culture results to assess for antibiotic optimization. Monitor renal function to assess for any necessary antibiotic dosing changes. Pharmacy will continue to follow and will adjust abx dosing whenever warranted.  Temp (24hrs), Avg:98 F (36.7 C), Min:97 F (36.1 C), Max:99.7 F (37.6 C)   Recent Labs  Lab 06/01/24 0615 06/01/24 1224  WBC 8.2  --   CREATININE 2.36*  --   LATICACIDVEN 1.1 1.0    Estimated Creatinine Clearance: 16.9 mL/min (A) (by C-G formula based on SCr of 2.36 mg/dL (H)).    Allergies  Allergen Reactions   Baclofen Other (See Comments)    Elevated Cr   Simvastatin Other (See Comments)    Pt denies  Elevated LFTs with simva 40mg , stopped and resolved (see MD note 11/10/13)    Antimicrobials this admission: 11/10 Cefepime  >> x 1 dose 11/10 Ceftriaxone  >> x 5 days 11/10 Azithromycin  >> x 5 days 11/10 Vancomycin  >>  Microbiology results: 11/10 BCx:  2 single Blood Cx drawn, both 2 of 2 with GPC, Enterococcus Faecium detected   Thank you for allowing pharmacy to be a part of this patient's care.  Leslie Parker, PharmD, MBA 06/02/2024 1:26 AM

## 2024-06-02 NOTE — TOC Progression Note (Addendum)
 Transition of Care [facility]) - Progression Note    Patient Details  Name: Leslie Parker MRN: [id number] Date of Birth: 1932-11-02  [facility] CM/SW Contact  Lorraine LILLETTE Fenton, LCSW Phone Number: 06/02/2024, 10:07 AM  Clinical Narrative:    CSW spoke with Admin at [facility] who stated that pt can return as soon as ready- asked that Auth be restarted prior to DC.   CSW called [employer] to start Auth as unable to use [employer]. Reference #[id number], Clinicals faxed. ICM following.  AddendumBETHA Skates called to ask would pt DC today- checked with MD- pt not DC ready today. ICM following.  Update: Call from [employer]/Auth withdrawn as pt not medically ready and no therapy note filed. Request Auth when pt medically ready.   Expected Discharge Plan: Skilled Nursing Facility Barriers to Discharge: Continued Medical Work up               Expected Discharge Plan and Services In-house Referral: Clinical Social Work                                             Social Drivers of Health (SDOH) Interventions SDOH Screenings   Food Insecurity: No Food Insecurity (06/01/2024)  Housing: Low Risk  (06/01/2024)  Transportation Needs: No Transportation Needs (06/01/2024)  Utilities: Not At Risk (06/01/2024)  Alcohol Screen: Low Risk  (08/20/2023)  Financial Resource Strain: Low Risk  (01/13/2024)   Received from [facility]  Social Connections: Moderately Isolated (06/01/2024)  Tobacco Use: Low Risk  (06/01/2024)    Readmission Risk Interventions    10/22/2023    2:28 PM 09/16/2023    2:52 PM 08/20/2023    3:45 PM  Readmission Risk Prevention Plan  Transportation Screening Complete Complete Complete  Medication Review Oceanographer) Complete Complete Complete  PCP or Specialist appointment within 3-5 days of discharge  Complete Complete  HRI or Home Care Consult Not Complete Patient refused Complete  HRI or Home Care Consult Pt Refusal Comments No PT recs at this  time.    SW Recovery Care/Counseling Consult Complete Complete Complete  Palliative Care Screening Not Applicable Not Applicable Not Applicable  Skilled Nursing Facility Not Applicable Not Applicable Not Applicable

## 2024-06-02 NOTE — Hospital Course (Signed)
 Ms. Leslie Parker is an 89 year old female with history of atrial fibrillation amiodarone  and apixaban , heart failure, pacemaker status, hypertension, GERD, hypothyroid, CKD stage IV, who presents for chief concerns of cough, congestion, shortness of breath.  Vitals and labs on day 1 of hospital service tof 97.4, rr of 68, hr of 62, blood pressure 137/59, SpO2 of 95% on room air.  Serum sodium is 136, potassium 3.1, chloride 103, bicarb 21, BUN of 64, serum creatinine of 2.34, eGFR 19, nonfasting blood glucose 119, WBC 9.3, hemoglobin 10.5, platelets of 228.  Lactic acid in the ED was 1.1 and on repeat is 1.0.  CK is 30. HS troponin was initially 46 and on repeat was 45, BNP was markedly elevated at greater than 4500.  Blood culture was positive for Enterococcus VCM.  Legionella, MRSA, strep pneumonia has been ordered and pending collection.  Chest x-ray showed bibasilar consolidation.    Admitting treatment: Azithromycin  500 mg IV, ceftriaxone  2 g IV daily, vancomycin  per pharmacy, oxycodone  5 mg as needed, and home torsemide  was held on admission.

## 2024-06-02 NOTE — Assessment & Plan Note (Addendum)
 Secondary to Enterococcus Faecium on blood culture ID panel Blood culture stain was positive for gram-positive cocci Continue vancomycin  per pharmacy Complete echo ordered

## 2024-06-02 NOTE — Assessment & Plan Note (Signed)
 At baseline

## 2024-06-03 ENCOUNTER — Inpatient Hospital Stay (HOSPITAL_COMMUNITY)
Admit: 2024-06-03 | Discharge: 2024-06-03 | Disposition: A | Discharge status: Home / Self Care | Attending: Internal Medicine | Admitting: Internal Medicine

## 2024-06-03 DIAGNOSIS — R7881 Bacteremia: Secondary | ICD-10-CM

## 2024-06-03 DIAGNOSIS — Z950 Presence of cardiac pacemaker: Secondary | ICD-10-CM

## 2024-06-03 DIAGNOSIS — N184 Chronic kidney disease, stage 4 (severe): Secondary | ICD-10-CM | POA: Diagnosis not present

## 2024-06-03 DIAGNOSIS — J189 Pneumonia, unspecified organism: Secondary | ICD-10-CM | POA: Diagnosis not present

## 2024-06-03 DIAGNOSIS — I129 Hypertensive chronic kidney disease with stage 1 through stage 4 chronic kidney disease, or unspecified chronic kidney disease: Secondary | ICD-10-CM | POA: Diagnosis not present

## 2024-06-03 DIAGNOSIS — B952 Enterococcus as the cause of diseases classified elsewhere: Secondary | ICD-10-CM | POA: Diagnosis not present

## 2024-06-03 LAB — BASIC METABOLIC PANEL WITH GFR
Anion gap: 10 (ref 5–15)
BUN: 57 mg/dL — ABNORMAL HIGH (ref 8–23)
CO2: 20 mmol/L — ABNORMAL LOW (ref 22–32)
Calcium: 8.2 mg/dL — ABNORMAL LOW (ref 8.9–10.3)
Chloride: 104 mmol/L (ref 98–111)
Creatinine, Ser: 2.25 mg/dL — ABNORMAL HIGH (ref 0.44–1.00)
GFR, Estimated: 20 mL/min — ABNORMAL LOW (ref >60)
Glucose, Bld: 130 mg/dL — ABNORMAL HIGH (ref 70–99)
Potassium: 4.6 mmol/L (ref 3.5–5.1)
Sodium: 135 mmol/L (ref 135–145)

## 2024-06-03 LAB — CBC
HCT: 30.1 % — ABNORMAL LOW (ref 36.0–46.0)
Hemoglobin: 9.7 g/dL — ABNORMAL LOW (ref 12.0–15.0)
MCH: 31.1 pg (ref 26.0–34.0)
MCHC: 32.2 g/dL (ref 30.0–36.0)
MCV: 96.5 fL (ref 80.0–100.0)
Platelets: 207 K/uL (ref 150–400)
RBC: 3.12 MIL/uL — ABNORMAL LOW (ref 3.87–5.11)
RDW: 19.2 % — ABNORMAL HIGH (ref 11.5–15.5)
WBC: 8.4 K/uL (ref 4.0–10.5)
nRBC: 0 % (ref 0.0–0.2)

## 2024-06-03 LAB — ECHOCARDIOGRAM COMPLETE
AR max vel: 1.24 cm2
AV Area VTI: 1.25 cm2
AV Area mean vel: 1.22 cm2
AV Mean grad: 5 mmHg
AV Peak grad: 9.9 mmHg
Ao pk vel: 1.57 m/s
Area-P 1/2: 5.46 cm2
Calc EF: 38.2 %
Height: 69 in
MV VTI: 1.58 cm2
S' Lateral: 4.55 cm
Single Plane A2C EF: 42.5 %
Single Plane A4C EF: 32.2 %
Weight: 2701.96 [oz_av]

## 2024-06-03 LAB — VANCOMYCIN, RANDOM: Vancomycin Rm: 11 ug/mL

## 2024-06-03 LAB — URINE CULTURE

## 2024-06-03 LAB — LEGIONELLA PNEUMOPHILA SEROGP 1 UR AG: L. pneumophila Serogp 1 Ur Ag: NEGATIVE

## 2024-06-03 MED ORDER — TORSEMIDE 20 MG PO TABS
40.0000 mg | ORAL_TABLET | Freq: Every day | ORAL | Status: DC
  Administered 2024-06-03: 40 mg via ORAL
  Filled 2024-06-03: qty 2

## 2024-06-03 MED ORDER — MORPHINE SULFATE (PF) 2 MG/ML IV SOLN
2.0000 mg | INTRAVENOUS | Status: DC | PRN
Start: 2024-06-03 — End: 2024-06-04
  Administered 2024-06-03 – 2024-06-04 (×2): 2 mg via INTRAVENOUS
  Filled 2024-06-03 (×3): qty 1

## 2024-06-03 MED ORDER — VANCOMYCIN HCL IN DEXTROSE 1-5 GM/200ML-% IV SOLN
1000.0000 mg | INTRAVENOUS | Status: DC
  Administered 2024-06-03 – 2024-06-05 (×2): 1000 mg via INTRAVENOUS
  Filled 2024-06-03 (×2): qty 200

## 2024-06-03 MED ORDER — LACTULOSE 10 GM/15ML PO SOLN
20.0000 g | Freq: Once | ORAL | Status: AC
  Administered 2024-06-03: 20 g via ORAL
  Filled 2024-06-03: qty 30

## 2024-06-03 MED ORDER — SODIUM CHLORIDE 0.9 % IV SOLN: INTRAVENOUS | Status: DC

## 2024-06-03 MED ORDER — APIXABAN 2.5 MG PO TABS: 2.5000 mg | ORAL_TABLET | Freq: Two times a day (BID) | ORAL | Status: DC

## 2024-06-03 MED ORDER — SODIUM BICARBONATE 650 MG PO TABS
650.0000 mg | ORAL_TABLET | Freq: Two times a day (BID) | ORAL | Status: DC
  Administered 2024-06-03 – 2024-06-04 (×3): 650 mg via ORAL
  Filled 2024-06-03 (×3): qty 1

## 2024-06-03 MED ORDER — APIXABAN 2.5 MG PO TABS
2.5000 mg | ORAL_TABLET | Freq: Two times a day (BID) | ORAL | Status: DC
  Administered 2024-06-03 – 2024-06-05 (×4): 2.5 mg via ORAL
  Filled 2024-06-03 (×4): qty 1

## 2024-06-03 NOTE — Progress Notes (Signed)
 Progress Note   Patient: Leslie Parker FMW:[id number] DOB: [birth year] DOA: 06/01/2024     2 DOS: the patient was seen and examined on 06/03/2024   Brief hospital course: Leslie Parker is an 89 year old female with history of atrial fibrillation amiodarone  and apixaban , chronic combined systolic diastolic congestive heart failure with ejection fraction 25 to 30%`, pacemaker status, hypertension, GERD, hypothyroid, CKD stage IV, who presents for chief concerns of cough, congestion, shortness of breath. She did not meet sepsis criteria, chest x-ray showed bilateral lower fields consolidation with a small pleural effusion. Initially treated with Rocephin , vancomycin  and Zithromax .  Blood culture grew Enterococcus Faecium.     Principal Problem:   Bacteremia due to Enterococcus Active Problems:   Community acquired pneumonia   Diabetes mellitus without complication (HCC)   Chronic a-fib (HCC)   Chronic diastolic CHF (congestive heart failure) (HCC)   Essential hypertension   COPD (chronic obstructive pulmonary disease) (HCC)   CKD (chronic kidney disease) stage 4, GFR 15-29 ml/min (HCC)   Hypothyroidism   Assessment and Plan: * Bacteremia due to Enterococcus Bilateral lower lobe pneumonia. Patient did not meet sepsis criteria at time of admission, sepsis ruled out. Blood culture grew Enterococcus, source is probably from pneumonia. I have personally reviewed patient chest x-ray, patient does have significant bilateral pneumonia, more on the right side. Appreciate ID consult, which has recommended TEE to rule out endocarditis. Continue current antibiotics.  Chronic combined systolic and diastolic congestive heart failure. Reviewed echocardiogram performed 01/2024, ejection fraction 25 to 30% with grade 2 diastolic dysfunction. Patient has been having significant elevation of BNP more than 4500 since January 2025, she has significant short of breath.  Will restart torsemide  40 mg  daily. Patient long-term prognosis is very poor given combined severe congestive heart failure and stage IV chronic kidney disease.   Chronic a-fib (HCC) Continue home treatment including beta-blocker and Eliquis .  CKD (chronic kidney disease) stage 4, GFR 15-29 ml/min (HCC) Hypokalemia Metabolic acidosis. Renal function is still stable, potassium normalized.  Add sodium bicarb for mild metabolic acidosis.  COPD (chronic obstructive pulmonary disease) (HCC) Not in acute exacerbation  Essential hypertension Continue home medicines.  Anemia of chronic disease. Continue to follow.  Prognosis. Patient long-term prognosis is very poor given advanced heart disease and renal disease and advanced age.  Discussed with patient daughter, patient is DO NOT RESUSCITATE status.  Also get palliative care consult, patient is appropriate for hospice.     Subjective:  Patient has some confusion, some short of breath.  No chest pain.  Physical Exam: Vitals:   06/02/24 1914 06/03/24 0455 06/03/24 0500 06/03/24 0726  BP: (!) 124/49 (!) 136/58  (!) 131/51  Pulse: 65 64  65  Resp: 18 18  16   Temp: 98.4 F (36.9 C) 98.1 F (36.7 C)  98.1 F (36.7 C)  TempSrc:      SpO2: 99% 97%  95%  Weight:   76.6 kg   Height:       General exam: Appears calm and comfortable  Respiratory system: Decreased breath sounds. Respiratory effort normal. Cardiovascular system: S1 & S2 heard, RRR. No JVD, murmurs, rubs, gallops or clicks. No pedal edema. Gastrointestinal system: Abdomen is nondistended, soft and nontender. No organomegaly or masses felt. Normal bowel sounds heard. Central nervous system: Alert and oriented x2 No focal neurological deficits. Extremities: Symmetric 5 x 5 power. Skin: No rashes, lesions or ulcers Psychiatry: Mood & affect appropriate.    Data Reviewed:  Reviewed echocardiogram  results, chest x-ray results, lab results  Family Communication: Daughter updated over the  phone  Disposition: Status is: Inpatient Remains inpatient appropriate because: Severity of disease, IV treatment, inpatient procedure.     Time spent: 60 minutes  Author: Murvin Mana, MD 06/03/2024 1:19 PM  For on call review [email or web address].

## 2024-06-03 NOTE — Evaluation (Addendum)
 Occupational Therapy Evaluation Patient Details Name: Leslie Parker MRN: [id number] DOB: [birth year] Today's Date: 06/03/2024   History of Present Illness   Pt is an 89 year old female admitted with Bacteremia due to Enterococcus, CAP    PMH significant for  atrial fibrillation amiodarone  and apixaban , heart failure, pacemaker status, hypertension, GERD, hypothyroid, CKD stage IV     Clinical Impressions Chart reviewed to date, this therapist had attempted to see patient earlier in the day, she requested I return around noon. PT already in room, pt requires encouragement for engagement in ADL/functional mobility tasks She is oriented to self and place, inconsistent one step direction following, impaired STM and reasoning/problem solving. Per facility she is there for STR, discharge plan is ongoing however, potential to become LTC. She requires TOTAL A+2 for boost in bed, TOTAL A +2 for rolling in bed, quarter turn with pillow placed under R hip for pressure relief. She does attempt to reach with her RUE towards bed rail during roll. She requires MAX A for sips of water, adhering to aspiration precautions. Pt reports significant pain throughout her stomach, worsened with HOB raised. OT will follow acutely.      If plan is discharge home, recommend the following:   Two people to help with walking and/or transfers;Two people to help with bathing/dressing/bathroom;Supervision due to cognitive status     Functional Status Assessment   Patient has had a recent decline in their functional status and demonstrates the ability to make significant improvements in function in a reasonable and predictable amount of time.     Equipment Recommendations   Other (comment) (defer to next venue of care)     Recommendations for Other Services         Precautions/Restrictions   Precautions Precautions: Fall Recall of Precautions/Restrictions: Impaired Restrictions Weight Bearing Restrictions  Per Provider Order: No     Mobility Bed Mobility Overal bed mobility: Needs Assistance Bed Mobility: Rolling Rolling: Total assist, +2 for physical assistance, +2 for safety/equipment, Used rails         General bed mobility comments: to the patients L side; limited tolerance for additional mobility attempts on this date  TOTAL A +2 for boost in bed  Transfers                   General transfer comment: NT      Balance                                           ADL either performed or assessed with clinical judgement   ADL Overall ADL's : Needs assistance/impaired Eating/Feeding: Maximal assistance;Bed level Eating/Feeding Details (indicate cue type and reason): aspiration preacautions                 Lower Body Dressing: Total assistance       Toileting- Clothing Manipulation and Hygiene: Total assistance Toileting - Clothing Manipulation Details (indicate cue type and reason): anticipate             Vision Patient Visual Report: No change from baseline Additional Comments: will continue to assess     Perception         Praxis         Pertinent Vitals/Pain Pain Assessment Pain Assessment: Faces Faces Pain Scale: Hurts worst Pain Location: stomach/nausea Pain Descriptors / Indicators: Grimacing, Guarding, Crying Pain Intervention(s): Limited activity  within patient's tolerance, Repositioned (PT notified team via secure chat)     Extremity/Trunk Assessment Upper Extremity Assessment Upper Extremity Assessment: Generalized weakness;LUE deficits/detail;Difficult to assess due to impaired cognition LUE Deficits / Details: guarding LUE throughout, does not attempt to perform AROM when asked to lift her arm to put a pillow under it, will continue to assess   Lower Extremity Assessment Lower Extremity Assessment: Defer to PT evaluation       Communication Communication Communication: Impaired Factors Affecting  Communication: Reduced clarity of speech   Cognition Arousal: Lethargic Behavior During Therapy: Flat affect Cognition: Cognition impaired   Orientation impairments: Situation, Time Awareness: Online awareness impaired Memory impairment (select all impairments): Short-term memory Attention impairment (select first level of impairment): Focused attention Executive functioning impairment (select all impairments): Reasoning, Problem solving, Sequencing                   Following commands: Impaired Following commands impaired: Follows one step commands inconsistently, Follows one step commands with increased time     Cueing  General Comments   Cueing Techniques: Verbal cues;Tactile cues;Visual cues  pt reporting significant pain in her stomach/feeling nauseous esp with raised [facility]   Exercises Other Exercises Other Exercises: edu re role of rehab, importance of continued mobility attempts   Shoulder Instructions      Home Living Family/patient expects to be discharged to:: Skilled nursing facility                                 Additional Comments: Per chart pt lives in a senior living apartment, sometimes sleep in a lift chair; recently from STR      Prior Functioning/Environment Prior Level of Function : Needs assist;Patient poor historian/Family not available             Mobility Comments: will need to confirm, per chart approx 1 month ago pt was amb with staff using RW, mwc primary means of completing MRADLs ADLs Comments: will need to confirm, anticipate assist from staff at SNF for ADL/IADL; approx 2 months ago pt reports she was indep in ADLs    OT Problem List: Decreased strength;Decreased activity tolerance;Impaired balance (sitting and/or standing);Decreased cognition;Decreased knowledge of use of DME or AE   OT Treatment/Interventions: Self-care/ADL training;Balance training;Therapeutic exercise;Therapeutic activities;DME and/or AE  instruction;Patient/family education      OT Goals(Current goals can be found in the care plan section)   Acute Rehab OT Goals Patient Stated Goal: leave me alone OT Goal Formulation: With patient Time For Goal Achievement: 06/17/24 Potential to Achieve Goals: Fair ADL Goals Pt Will Perform Grooming: with mod assist;sitting Pt Will Perform Lower Body Dressing: with mod assist;sitting/lateral leans Pt Will Transfer to Toilet: with mod assist;stand pivot transfer Pt Will Perform Toileting - Clothing Manipulation and hygiene: with mod assist;sit to/from stand;sitting/lateral leans   OT Frequency:  Min 2X/week    Co-evaluation PT/OT/SLP Co-Evaluation/Treatment: Yes Reason for Co-Treatment:  (tolerance)          AM-PAC OT 6 Clicks Daily Activity     Outcome Measure Help from another person eating meals?: A Lot Help from another person taking care of personal grooming?: A Lot Help from another person toileting, which includes using toliet, bedpan, or urinal?: Total Help from another person bathing (including washing, rinsing, drying)?: Total Help from another person to put on and taking off regular upper body clothing?: A Lot Help from another person to put  on and taking off regular lower body clothing?: Total 6 Click Score: 9   End of Session Nurse Communication: Other (comment) (secure chat sent by PT regarding status)  Activity Tolerance: Patient limited by pain Patient left: in bed;with call bell/phone within reach;with bed alarm set  OT Visit Diagnosis: Muscle weakness (generalized) (M62.81)                Time: 1150-1200 OT Time Calculation (min): 10 min Charges:  OT General Charges $OT Visit: 1 Visit OT Evaluation $OT Eval Low Complexity: 1 Low  Therisa Sheffield, OTD OTR/L  06/03/24, 1:38 PM

## 2024-06-03 NOTE — Progress Notes (Signed)
 Date of Admission:  06/01/2024     ID: Leslie Parker is a 89 y.o. female  Principal Problem:   Bacteremia due to Enterococcus Active Problems:   Diabetes mellitus without complication (HCC)   Chronic a-fib (HCC)   Essential hypertension   COPD (chronic obstructive pulmonary disease) (HCC)   Chronic diastolic CHF (congestive heart failure) (HCC)   CKD (chronic kidney disease) stage 4, GFR 15-29 ml/min (HCC)   Hypothyroidism   Community acquired pneumonia    Subjective: Pt is feeling fine No sob Appetite poor  Medications:   amiodarone   200 mg Oral Daily   apixaban   2.5 mg Oral BID   ascorbic acid   500 mg Oral Daily   budesonide -glycopyrrolate -formoterol   2 puff Inhalation BID   carvedilol   3.125 mg Oral BID WC   cholecalciferol   1,000 Units Oral Daily   hydrALAZINE   25 mg Oral TID   levothyroxine   50 mcg Oral Q0600   multivitamin with minerals  1 tablet Oral Daily   pantoprazole   40 mg Oral BID   sodium bicarbonate  650 mg Oral BID   torsemide   40 mg Oral Daily    Objective: Vital signs in last 24 hours: Patient Vitals for the past 24 hrs:  BP Temp Pulse Resp SpO2 Weight  06/03/24 1425 (!) 124/58 (!) 97.5 F (36.4 C) 62 17 99 % --  06/03/24 0726 (!) 131/51 98.1 F (36.7 C) 65 16 95 % --  06/03/24 0500 -- -- -- -- -- 76.6 kg  06/03/24 0455 (!) 136/58 98.1 F (36.7 C) 64 18 97 % --  06/02/24 1914 (!) 124/49 98.4 F (36.9 C) 65 18 99 % --       PHYSICAL EXAM:  General: Alert, cooperative, no distress, appears stated age.  Lungs: b/l air entry. Decreased rt base Heart: s1s2 Abdomen: Soft, non-tender,not distended. Bowel sounds normal. No masses Extremities: atraumatic, no cyanosis. No edema. No clubbing/l knee scar Skin:  bruising Lymph: Cervical, supraclavicular normal. Neurologic: Grossly non-focal  Lab Results    Latest Ref Rng & Units 06/03/2024    6:07 AM 06/02/2024    4:58 AM 06/01/2024    6:15 AM  CBC  WBC 4.0 - 10.5 K/uL 8.4  9.3  8.2    Hemoglobin 12.0 - 15.0 g/dL 9.7  89.4  88.5   Hematocrit 36.0 - 46.0 % 30.1  32.5  36.0   Platelets 150 - 400 K/uL 207  228  248        Latest Ref Rng & Units 06/03/2024    6:07 AM 06/02/2024    4:58 AM 06/01/2024    6:15 AM  CMP  Glucose 70 - 99 mg/dL 869  881  881   BUN 8 - 23 mg/dL 57  64  55   Creatinine 0.44 - 1.00 mg/dL 7.74  7.65  7.63   Sodium 135 - 145 mmol/L 135  136  137   Potassium 3.5 - 5.1 mmol/L 4.6  3.1  3.1   Chloride 98 - 111 mmol/L 104  103  104   CO2 22 - 32 mmol/L 20  21  20    Calcium 8.9 - 10.3 mg/dL 8.2  8.2  8.4   Total Protein 6.5 - 8.1 g/dL  5.3  6.4   Total Bilirubin 0.0 - 1.2 mg/dL  0.9  1.5   Alkaline Phos 38 - 126 U/L  103  125   AST 15 - 41 U/L  41  51  ALT 0 - 44 U/L  36  42       Microbiology: 11/10 2/2 sets of blood culture positive for enterococcus faecium Studies/Results: DG Abd 1 View Result Date: 06/02/2024 EXAM: 1 VIEW XRAY OF THE ABDOMEN 06/02/2024 11:44:00 AM COMPARISON: 02/28/2024 CLINICAL HISTORY: Abdominal pain [id number]; [id number] Constipation [id number] FINDINGS: BOWEL: Moderately dilated loop of small bowel in the left upper quadrant, concerning for ileus or possibly small bowel obstruction. Mild amount of stool is noted throughout the nondilated colon. SOFT TISSUES: No opaque urinary calculi. BONES: No acute osseous abnormality. IMPRESSION: 1. Moderately dilated loop of small bowel in the left upper quadrant, worrisome for ileus versus small-bowel obstruction. Electronically signed by: Lynwood Seip MD 06/02/2024 11:54 AM EST RP Workstation: [id number]     Assessment/Plan: Enterococcus VCM bacteremia in a patient with pacemaker.  There is concern for endocarditis/devic 2D echo done today Will need TEE Patient is currently on vancomycin  and also ceftriaxone .  Depending on susceptibility will be able to switch to vancomycin  to ampicillin . Repeat blood culture  Bilateral pleural effusion bibasilar consolidation likely congestive heart  failure On torsemide  and Coreg . Chronic A-fib rate well-controlled On amiodarone  and Eliquis   CKD  Hypertension on hydralazine   Hypothyroidism on Synthroid   Discussed the management with the patient and her daughter and granddaughter and the hospitalist

## 2024-06-03 NOTE — Plan of Care (Signed)
  Problem: Education: Goal: Knowledge of General Education information will improve Description: Including pain rating scale, medication(s)/side effects and non-pharmacologic comfort measures Outcome: Progressing   Problem: Health Behavior/Discharge Planning: Goal: Ability to manage health-related needs will improve Outcome: Progressing   Problem: Clinical Measurements: Goal: Ability to maintain clinical measurements within normal limits will improve Outcome: Progressing Goal: Will remain free from infection Outcome: Progressing Goal: Diagnostic test results will improve Outcome: Progressing Goal: Respiratory complications will improve Outcome: Progressing   Problem: Nutrition: Goal: Adequate nutrition will be maintained Outcome: Progressing   

## 2024-06-03 NOTE — Consult Note (Signed)
 [facility] CLINIC CARDIOLOGY CONSULT NOTE       Patient ID: Leslie Parker MRN: [id number] DOB/AGE: Feb 23, 1933 89 y.o.  Admit date: 06/01/2024 Referring Physician Dr. Murvin Mana Primary Physician Sherial Bail, MD  Primary Cardiologist Dr. Florencio Reason for Consultation bacteremia, need for TEE  HPI: Leslie Parker is a 89 y.o. female  with a past medical history of chronic HFrEF (EF 25-30% - 07/2023), paroxymal atrial fibrillation (on Eliquis , PO amio), SSS s/p [employer] dual chamber PPM (11/15/2022), hypertension,  type 2 diabetes mellitus, severe COPD stage 3 by GOLD classification, CKG stage 4 who presented to the ED on 06/01/2024 for redness.  Blood cultures positive for Enterococcus.  Cardiology was consulted for need for TEE.   Patient reports that prior to come to the hospital she was out of facility was working with therapy at which time she was noted to be very weak.  She was brought to the ED for further evaluation given this.  Throughout admission she has been treated for community-acquired pneumonia and ultimately was found to have positive blood cultures growing Enterococcus faecium.  Notable lab work today includes creatinine 2.25, potassium 4.6, sodium 135, hemoglobin 9.7, WBC 8.4, platelets 207.  At the time my evaluation this morning patient is resting comfortably in hospital bed.  We discussed her presentation to the hospital in further detail.  She denies any recent issues with chest pain, palpitations.  States that she frequently has short of breath and is feeling slightly short of breath today but overall this is not very bothersome.  She remains on room air.  I discussed with patient that ID is recommending she have TEE to rule out endocarditis given her bacteremia.  She expressed understanding of need for procedure, procedure details and is amenable to proceeding.  Review of systems complete and found to be negative unless listed above    Past Medical  History:  Diagnosis Date   Acute kidney injury superimposed on CKD 11/24/2022   Asthma    Atrial fibrillation with RVR (HCC)    Cancer (HCC)    Basal Cell   CHF (congestive heart failure) (HCC)    Closed left hip fracture (HCC) 01/25/2017   Closed right hip fracture (HCC) 02/13/2023   Diabetes mellitus without complication (HCC)    Femur fracture, left (HCC) 02/03/2015   Heart murmur    Hypertension    Impetigo    Osteoarthritis    Osteopenia    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   Rapid atrial fibrillation, new onset(HCC) 06/05/2022   Vomiting    can not due to surgery   Wears dentures    full upper and lower    Past Surgical History:  Procedure Laterality Date   ABDOMINAL HYSTERECTOMY     BACK SURGERY     BLADDER SURGERY     mesh   CARPAL TUNNEL RELEASE Bilateral    CATARACT EXTRACTION Right 2017   CATARACT EXTRACTION W/PHACO Left 02/06/2016   Procedure: CATARACT EXTRACTION PHACO AND INTRAOCULAR LENS PLACEMENT (IOC) left eye;  Surgeon: Donzell Arlyce Budd, MD;  Location: [facility];  Service: Ophthalmology;  Laterality: Left;  DIABETIC LEFT Cannot arrive before 9:30   CERVICAL DISC SURGERY     CHOLECYSTECTOMY     EYE SURGERY Bilateral    Cataract Extraction with IOL   FEMUR IM NAIL Left 02/04/2015   Procedure: INTRAMEDULLARY (IM) NAIL FEMORAL;  Surgeon: Franky Cranker, MD;  Location: [facility];  Service: Orthopedics;  Laterality: Left;   HARDWARE  REMOVAL Left 01/17/2017   Procedure: HARDWARE REMOVAL;  Surgeon: Marchia Drivers, MD;  Location: [facility];  Service: Orthopedics;  Laterality: Left;   HERNIA REPAIR  2014   esophageal and gastric mesh. patient unable to throw up d/t mesh   HIP ARTHROPLASTY Left 01/26/2017   Procedure: ARTHROPLASTY BIPOLAR HIP (HEMIARTHROPLASTY) removal hardware left hip;  Surgeon: Cleotilde Barrio, MD;  Location: [facility];  Service: Orthopedics;  Laterality: Left;   INTRAMEDULLARY (IM) NAIL INTERTROCHANTERIC Right 02/13/2023    Procedure: INTRAMEDULLARY (IM) NAIL INTERTROCHANTERIC;  Surgeon: Edie Norleen PARAS, MD;  Location: [facility];  Service: Orthopedics;  Laterality: Right;   JOINT REPLACEMENT Bilateral    knees   PACEMAKER IMPLANT N/A 11/15/2022   Procedure: PACEMAKER IMPLANT;  Surgeon: Ammon Blunt, MD;  Location: [facility];  Service: Cardiovascular;  Laterality: N/A;   PACEMAKER IMPLANT N/A 11/28/2022   Procedure: PACEMAKER IMPLANT;  Surgeon: Ammon Blunt, MD;  Location: [facility];  Service: Cardiovascular;  Laterality: N/A;  Lead reposition   REPLACEMENT TOTAL KNEE BILATERAL Bilateral [id number]   SHOULDER ARTHROSCOPY WITH OPEN ROTATOR CUFF REPAIR AND DISTAL CLAVICLE ACROMINECTOMY Left 10/25/2016   Procedure: SHOULDER ARTHROSCOPY WITH OPEN ROTATOR CUFF REPAIR AND DISTAL CLAVICLE ACROMINECTOMY;  Surgeon: Drivers Marchia, MD;  Location: [facility];  Service: Orthopedics;  Laterality: Left;   THYROID  SURGERY     goiter removed   TOTAL HIP REVISION Left 12/02/2017   Procedure: TOTAL HIP REVISION;  Surgeon: Leora Lynwood SAUNDERS, MD;  Location: [facility];  Service: Orthopedics;  Laterality: Left;   TOTAL SHOULDER REPLACEMENT Right 2012    Medications Prior to Admission  Medication Sig Dispense Refill Last Dose/Taking   acetaminophen  (TYLENOL ) 325 MG tablet Take 2 tablets (650 mg total) by mouth every 8 (eight) hours as needed for fever, headache or mild pain (pain score 1-3).   06/01/2024 Morning   albuterol  (VENTOLIN  HFA) 108 (90 Base) MCG/ACT inhaler Inhale 2 puffs into the lungs every 6 (six) hours as needed.   Taking As Needed   alum & mag hydroxide-simeth (MAALOX/MYLANTA) 200-200-20 MG/5ML suspension Take 30 mLs by mouth every 6 (six) hours as needed for indigestion or heartburn.   Taking As Needed   amiodarone  (PACERONE ) 200 MG tablet Take 1 tablet by mouth daily.   05/31/2024   apixaban  (ELIQUIS ) 2.5 MG TABS tablet Take 1 tablet (2.5 mg total) by mouth 2 (two) times daily. 60 tablet 1  05/31/2024   ascorbic acid  (VITAMIN C ) 500 MG tablet Take 500 mg by mouth daily.   05/31/2024   carvedilol  (COREG ) 3.125 MG tablet Take 1 tablet (3.125 mg total) by mouth 2 (two) times daily with a meal. Reduced from 6.25 mg.   05/31/2024   Cholecalciferol  (VITAMIN D -1000 MAX ST) 25 MCG (1000 UT) tablet Take 1,000 Units by mouth daily.   05/31/2024   guaiFENesin -dextromethorphan  (ROBITUSSIN DM) 100-10 MG/5ML syrup Take 10 mLs by mouth every 6 (six) hours as needed for cough.   Taking As Needed   hydrALAZINE  (APRESOLINE ) 25 MG tablet 25 mg 3 (three) times daily. 0800/1400/2000   05/31/2024   levothyroxine  (SYNTHROID ) 25 MCG tablet Take 2 tablets (50 mcg total) by mouth daily at 6 (six) AM. (Patient taking differently: Take 75 mcg by mouth daily at 6 (six) AM.)   06/01/2024 Morning   Multiple Vitamin (MULTIVITAMIN WITH MINERALS) TABS tablet Take 1 tablet by mouth daily. One-A-Day Women's Vitamin   05/31/2024   ondansetron  (ZOFRAN ) 4 MG tablet Take 1 tablet (4 mg total)  by mouth every 6 (six) hours as needed for nausea. 20 tablet 0 06/01/2024 Morning   pantoprazole  (PROTONIX ) 40 MG tablet Take 1 tablet (40 mg total) by mouth 2 (two) times daily. 60 tablet 0 05/31/2024   polyethylene glycol (MIRALAX  / GLYCOLAX ) 17 g packet Take 17 g by mouth daily as needed.   Taking As Needed   senna-docusate (SENOKOT-S) 8.6-50 MG tablet Take 2 tablets by mouth at bedtime as needed for mild constipation.   Taking As Needed   sucralfate (CARAFATE) 1 g tablet Take 1 g by mouth 3 (three) times daily.   05/31/2024   torsemide  (DEMADEX ) 20 MG tablet Take 40 mg by mouth daily as needed.   05/27/2024   TRELEGY ELLIPTA 100-62.5-25 MCG/ACT AEPB Inhale 1 puff into the lungs daily.   05/31/2024   melatonin 5 MG TABS Take 1 tablet (5 mg total) by mouth at bedtime as needed. (Patient not taking: Reported on 06/01/2024)   Not Taking   Social History   Socioeconomic History   Marital status: Divorced    Spouse name: Not on file   Number  of children: 4   Years of education: Not on file   Highest education level: 12th grade  Occupational History   Occupation: Retitred/Disability  Tobacco Use   Smoking status: Never   Smokeless tobacco: Never  Vaping Use   Vaping status: Never Used  Substance and Sexual Activity   Alcohol use: No    Alcohol/week: 0.0 standard drinks of alcohol   Drug use: No   Sexual activity: Not Currently  Other Topics Concern   Not on file  Social History Narrative   Not on file   Social Drivers of Health   Financial Resource Strain: Low Risk  (01/13/2024)   Received from [facility]   Overall Financial Resource Strain (CARDIA)    Difficulty of Paying Living Expenses: Not hard at all  Food Insecurity: No Food Insecurity (06/01/2024)   Hunger Vital Sign    Worried About Running Out of Food in the Last Year: Never true    Ran Out of Food in the Last Year: Never true  Transportation Needs: No Transportation Needs (06/01/2024)   PRAPARE - Administrator, Civil Service (Medical): No    Lack of Transportation (Non-Medical): No  Physical Activity: Not on file  Stress: Not on file  Social Connections: Moderately Isolated (06/01/2024)   Social Connection and Isolation Panel    Frequency of Communication with Friends and Family: More than three times a week    Frequency of Social Gatherings with Friends and Family: Three times a week    Attends Religious Services: 1 to 4 times per year    Active Member of Clubs or Organizations: No    Attends Banker Meetings: Never    Marital Status: Divorced  Catering Manager Violence: Not At Risk (06/01/2024)   Humiliation, Afraid, Rape, and Kick questionnaire    Fear of Current or Ex-Partner: No    Emotionally Abused: No    Physically Abused: No    Sexually Abused: No    Family History  Problem Relation Age of Onset   Heart failure Mother    Hypertension Mother    Emphysema Father      Vitals:   06/02/24  1914 06/03/24 0455 06/03/24 0500 06/03/24 0726  BP: (!) 124/49 (!) 136/58  (!) 131/51  Pulse: 65 64  65  Resp: 18 18  16   Temp: 98.4 F (  36.9 C) 98.1 F (36.7 C)  98.1 F (36.7 C)  TempSrc:      SpO2: 99% 97%  95%  Weight:   76.6 kg   Height:        PHYSICAL EXAM General: Chronically ill-appearing elderly female, well nourished, in no acute distress. HEENT: Normocephalic and atraumatic. Neck: No JVD.  Lungs: Normal respiratory effort on room air. Clear bilaterally to auscultation. No wheezes, crackles, rhonchi.  Heart: HRRR. Normal S1 and S2 without gallops or murmurs.  Abdomen: Non-distended appearing.  Msk: Normal strength and tone for age. Extremities: Warm and well perfused. No clubbing, cyanosis.  No edema.  Neuro: Alert and oriented X 3. Psych: Answers questions appropriately.   Labs: Basic Metabolic Panel: Recent Labs    06/01/24 0614 06/01/24 0615 06/02/24 0458 06/03/24 0607  NA  --    < > 136 135  K  --    < > 3.1* 4.6  CL  --    < > 103 104  CO2  --    < > 21* 20*  GLUCOSE  --    < > 118* 130*  BUN  --    < > 64* 57*  CREATININE  --    < > 2.34* 2.25*  CALCIUM  --    < > 8.2* 8.2*  MG 1.7  --  1.8  --    < > = values in this interval not displayed.   Liver Function Tests: Recent Labs    06/01/24 0615 06/02/24 0458  AST 51* 41  ALT 42 36  ALKPHOS 125 103  BILITOT 1.5* 0.9  PROT 6.4* 5.3*  ALBUMIN  2.5* 2.2*   No results for input(s): LIPASE, AMYLASE in the last 72 hours. CBC: Recent Labs    06/01/24 0615 06/02/24 0458 06/03/24 0607  WBC 8.2 9.3 8.4  NEUTROABS 6.1  --   --   HGB 11.4* 10.5* 9.7*  HCT 36.0 32.5* 30.1*  MCV 94.7 94.5 96.5  PLT 248 228 207   Cardiac Enzymes: Recent Labs    06/01/24 0614 06/01/24 0615 06/01/24 0916  CKTOTAL 30*  --   --   TROPONINIHS  --  46* 45*   BNP: Recent Labs    06/01/24 0615  BNP >4,500.0*   D-Dimer: No results for input(s): DDIMER in the last 72 hours. Hemoglobin A1C: No results  for input(s): HGBA1C in the last 72 hours. Fasting Lipid Panel: No results for input(s): CHOL, HDL, LDLCALC, TRIG, CHOLHDL, LDLDIRECT in the last 72 hours. Thyroid  Function Tests: No results for input(s): TSH, T4TOTAL, T3FREE, THYROIDAB in the last 72 hours.  Invalid input(s): FREET3 Anemia Panel: No results for input(s): VITAMINB12, FOLATE, FERRITIN, TIBC, IRON, RETICCTPCT in the last 72 hours.   Radiology: DG Abd 1 View Result Date: 06/02/2024 EXAM: 1 VIEW XRAY OF THE ABDOMEN 06/02/2024 11:44:00 AM COMPARISON: 02/28/2024 CLINICAL HISTORY: Abdominal pain [id number]; [id number] Constipation [id number] FINDINGS: BOWEL: Moderately dilated loop of small bowel in the left upper quadrant, concerning for ileus or possibly small bowel obstruction. Mild amount of stool is noted throughout the nondilated colon. SOFT TISSUES: No opaque urinary calculi. BONES: No acute osseous abnormality. IMPRESSION: 1. Moderately dilated loop of small bowel in the left upper quadrant, worrisome for ileus versus small-bowel obstruction. Electronically signed by: Lynwood Seip MD 06/02/2024 11:54 AM EST RP Workstation: [id number]   DG Chest Portable 1 View Result Date: 06/01/2024 CLINICAL DATA:  Cough and shortness of breath. EXAM: PORTABLE CHEST 1 VIEW COMPARISON:  04/13/2024  FINDINGS: The cardio pericardial silhouette is enlarged. Bibasilar collapse/consolidation with small to moderate bilateral pleural effusions, progressive in the interval. Left permanent pacemaker again noted. Telemetry leads overlie the chest. IMPRESSION: Bibasilar collapse/consolidation with small to moderate bilateral pleural effusions, progressive in the interval. Electronically Signed   By: Camellia Candle M.D.   On: 06/01/2024 06:24    ECHO pending review  TELEMETRY (personally reviewed): Ventricular pacing rate 60s  EKG (personally reviewed): Ventricular pacing rate 75 bpm  Data reviewed by me 06/03/2024: last 24h vitals  tele labs imaging I/O ED provider note, admission H&P  Principal Problem:   Bacteremia due to Enterococcus Active Problems:   Diabetes mellitus without complication (HCC)   Chronic a-fib (HCC)   Essential hypertension   COPD (chronic obstructive pulmonary disease) (HCC)   Chronic diastolic CHF (congestive heart failure) (HCC)   CKD (chronic kidney disease) stage 4, GFR 15-29 ml/min (HCC)   Hypothyroidism   Community acquired pneumonia    ASSESSMENT AND PLAN:  TARINI CARRIER is a 89 y.o. female  with a past medical history of chronic HFrEF (EF 25-30% - 07/2023), paroxymal atrial fibrillation (on Eliquis , PO amio), SSS s/p [employer] dual chamber PPM (11/15/2022), hypertension,  type 2 diabetes mellitus, severe COPD stage 3 by GOLD classification, CKG stage 4 who presented to the ED on 06/01/2024 for redness.  Blood cultures positive for Enterococcus.  Cardiology was consulted for need for TEE.   # Enterococcus faecium bacteremia # Community-acquired pneumonia # Chronic HFrEF (25-30%) # Paroxymal atrial fibrillation # SSS s/p dual chamber PPM (10/2022) # Chronic LBBB Patient with significant past cardiac history presented with weakness.  Found to have pneumonia and Enterococcus bacteremia on blood cultures.  Endorses some shortness of breath and BNP is elevated but this is commonly elevated and she has history of poorly controlled heart failure.  Otherwise has no cardiac complaints.  Pacemaker functioning appropriately on telemetry.  ID has requested TEE to rule out endocarditis. - TEE procedure discussed in detail with patient today.  All questions answered.  She is amenable to proceeding with this, it has been scheduled for tomorrow around 1 PM.  I also called and spoke with patient's daughter to discuss the plan, all questions answered and she is amenable as well. - N.p.o. after midnight in preparation for TEE tomorrow. -Continue carvedilol  3.125 mg twice daily, hydralazine  25 mg  twice daily, torsemide  40 mg. - Continue Eliquis  2.5 mg twice daily, amiodarone  200 mg daily.  This patient's plan of care was discussed and created with Dr. Ammon and he is in agreement.  Signed: Danita Bloch, PA-C  06/03/2024, 11:04 AM [facility]

## 2024-06-03 NOTE — Progress Notes (Signed)
 Pharmacy Antibiotic Note  Leslie Parker is a 89 y.o. female admitted on 06/01/2024 with bacteremia.  Pharmacy has been consulted for Vancomycin  dosing.   11/12: Patient's Scr improving (2.34 > 2.25). Will continue to monitor renal function. Checked vancomycin  random level, which was 11. Based off of the level, will adjust vancomycin  dosing.   Plan: Change vancomycin  dose to 1 g IV q48h.  Follow up culture results to assess for antibiotic optimization. Monitor renal function to assess for any necessary antibiotic dosing changes.  Pharmacy will continue to follow and will adjust abx dosing whenever warranted.  Temp (24hrs), Avg:98 F (36.7 C), Min:97.5 F (36.4 C), Max:98.4 F (36.9 C)   Recent Labs  Lab 06/01/24 0615 06/01/24 1224 06/02/24 0458 06/03/24 0607  WBC 8.2  --  9.3 8.4  CREATININE 2.36*  --  2.34* 2.25*  LATICACIDVEN 1.1 1.0  --   --   VANCORANDOM  --   --   --  11    Estimated Creatinine Clearance: 17.7 mL/min (A) (by C-G formula based on SCr of 2.25 mg/dL (H)).    Allergies  Allergen Reactions   Baclofen Other (See Comments)    Elevated Cr   Simvastatin Other (See Comments)    Pt denies  Elevated LFTs with simva 40mg , stopped and resolved (see MD note 11/10/13)    Antimicrobials this admission: 11/10 Cefepime  >> x 1 dose 11/10 Ceftriaxone  >> x 5 days 11/10 Azithromycin  >> x 5 days 11/10 Vancomycin  >>  Microbiology results: 11/10 BCx:  2 single Blood Cx drawn, both 2 of 2 with GPC, Enterococcus Faecium detected   Thank you for allowing pharmacy to be a part of this patient's care.  Ransom Blanch PGY-1 Pharmacy Resident  [place] [facility]  06/03/2024 4:17 PM

## 2024-06-03 NOTE — Progress Notes (Signed)
*  PRELIMINARY RESULTS* Echocardiogram 2D Echocardiogram has been performed.  Floydene Harder 06/03/2024, 10:55 AM

## 2024-06-03 NOTE — Evaluation (Signed)
 Physical Therapy Evaluation Patient Details Name: TAHJAE DURR MRN: [id number] DOB: 1933-04-17 Today's Date: 06/03/2024  History of Present Illness  Pt is an 89 year old female admitted with Bacteremia due to Enterococcus, CAP    PMH significant for  atrial fibrillation amiodarone  and apixaban , heart failure, pacemaker status, hypertension, GERD, hypothyroid, CKD stage IV  Clinical Impression  Patient resting in bed upon arrival to room; lethargic, but arousable to voice, light touch.  Initially conversational and responsive to therapist, requiring min/mod encouragement for participation with session.  Intermittently closes eyes to disengage with therapist, but does reopen with max prompting. Patient globally weak and deconditioned throughout all extremities; formal MMT difficult to assess, as patient intermittently resistant to facilitation/assist from therapist. With persistent encouragement and education, patient completes bed-level activity, requiring total assist +2 for all bed mobility, pressure relief and repositioning.  Does demonstrate some spontaneous movement of R UE during session; otherwise, active movement generally limited. Endorses significant abdominal pain (FACES 10/10) with any and all movement attempts, insisting on return to initial position despite attempts to turn and reposition.  RN/MD informed/aware of pain reports. Unable to tolerate additional mobility efforts at this time; will continue to assess and progress as patient tolerates. Would benefit from skilled PT to address above deficits and promote optimal return to PLOF; recommend post-acute PT follow up as indicated by interdisciplinary care team.          If plan is discharge home, recommend the following: Two people to help with bathing/dressing/bathroom;Two people to help with walking and/or transfers   Can travel by private vehicle        Equipment Recommendations    Recommendations for Other Services        Functional Status Assessment Patient has had a recent decline in their functional status and/or demonstrates limited ability to make significant improvements in function in a reasonable and predictable amount of time     Precautions / Restrictions Precautions Precautions: Fall Recall of Precautions/Restrictions: Impaired Restrictions Weight Bearing Restrictions Per Provider Order: No      Mobility  Bed Mobility Overal bed mobility: Needs Assistance Bed Mobility: Rolling Rolling: Total assist, +2 for physical assistance, +2 for safety/equipment, Used rails         General bed mobility comments: to the patients L side; limited tolerance for additional mobility attempts on this date    Transfers                   General transfer comment: unsafe/unable to tolerate    Ambulation/Gait               General Gait Details: unsafe/unable to tolerate  Stairs            Wheelchair Mobility     Tilt Bed    Modified Rankin (Stroke Patients Only)       Balance                                             Pertinent Vitals/Pain Pain Assessment Pain Assessment: Faces Faces Pain Scale: Hurts worst Pain Location: stomach/nausea Pain Descriptors / Indicators: Grimacing, Guarding, Crying, Moaning Pain Intervention(s): Limited activity within patient's tolerance, Repositioned, Monitored during session    Home Living Family/patient expects to be discharged to:: Skilled nursing facility  Additional Comments: Per chart pt lives in a senior living apartment, sometimes sleep in a lift chair; recently from [facility]    Prior Function Prior Level of Function : Needs assist;Patient poor historian/Family not available             Mobility Comments: Will need to confirm, per chart approx 1 month ago pt was amb with staff using RW, mwc primary means of completing MRADLs ADLs Comments: Will need to confirm, anticipate assist  from staff at SNF for ADL/IADL; approx 2 months ago pt reports she was indep in ADLs     Extremity/Trunk Assessment   Upper Extremity Assessment Upper Extremity Assessment: Generalized weakness LUE Deficits / Details: guarding LUE throughout, does not attempt to perform AROM when asked to lift her arm to put a pillow under it, will continue to assess    Lower Extremity Assessment Lower Extremity Assessment: Generalized weakness (grossly 2-/5 throughout; very limited active effort/movement, very limited tolerance for act assist/passive assist)       Communication   Communication Communication: Impaired Factors Affecting Communication: Reduced clarity of speech    Cognition Arousal: Lethargic Behavior During Therapy: Flat affect   PT - Cognitive impairments: No family/caregiver present to determine baseline                       PT - Cognition Comments: Oriented to self, location; limited command following (though appears related to behavior versus cognition) Following commands: Impaired Following commands impaired: Follows one step commands inconsistently, Follows one step commands with increased time     Cueing Cueing Techniques: Verbal cues, Tactile cues, Visual cues     General Comments      Exercises     Assessment/Plan    PT Assessment Patient needs continued PT services  PT Problem List Decreased strength;Decreased range of motion;Decreased activity tolerance;Decreased balance;Decreased mobility;Decreased coordination;Decreased cognition;Cardiopulmonary status limiting activity;Decreased knowledge of use of DME;Decreased safety awareness;Decreased knowledge of precautions;Pain       PT Treatment Interventions DME instruction;Gait training;Functional mobility training;Therapeutic activities;Balance training;Therapeutic exercise;Patient/family education;Cognitive remediation    PT Goals (Current goals can be found in the Care Plan section)  Acute Rehab PT  Goals Patient Stated Goal: to improve pain, to rest PT Goal Formulation: With patient Time For Goal Achievement: 06/17/24 Potential to Achieve Goals: Fair    Frequency Min 1X/week     Co-evaluation PT/OT/SLP Co-Evaluation/Treatment: Yes Reason for Co-Treatment: To address functional/ADL transfers;For patient/therapist safety (poor overall activity/exertional tolerance) PT goals addressed during session: Mobility/safety with mobility OT goals addressed during session: ADL's and self-care       AM-PAC PT 6 Clicks Mobility  Outcome Measure Help needed turning from your back to your side while in a flat bed without using bedrails?: Total Help needed moving from lying on your back to sitting on the side of a flat bed without using bedrails?: Total Help needed moving to and from a bed to a chair (including a wheelchair)?: Total Help needed standing up from a chair using your arms (e.g., wheelchair or bedside chair)?: Total Help needed to walk in hospital room?: Total Help needed climbing 3-5 steps with a railing? : Total 6 Click Score: 6    End of Session   Activity Tolerance: Patient limited by pain;Patient limited by lethargy Patient left: in bed;with call bell/phone within reach;with bed alarm set Nurse Communication: Mobility status PT Visit Diagnosis: Muscle weakness (generalized) (M62.81);Pain;Difficulty in walking, not elsewhere classified (R26.2)    Time: 8861-8797 PT  Time Calculation (min) (ACUTE ONLY): 24 min   Charges:   PT Evaluation $PT Eval Moderate Complexity: 1 Mod   PT General Charges $$ ACUTE PT VISIT: 1 Visit         Doriann Zuch H. Delores, PT, DPT, NCS 06/03/24, 9:42 PM [phone]

## 2024-06-04 ENCOUNTER — Inpatient Hospital Stay: Admitting: Anesthesiology

## 2024-06-04 ENCOUNTER — Inpatient Hospital Stay
Admit: 2024-06-04 | Discharge: 2024-06-04 | Disposition: A | Discharge status: Home / Self Care | Attending: Student | Admitting: Student

## 2024-06-04 ENCOUNTER — Encounter: Admission: EM | Disposition: E | Payer: Self-pay | Source: Skilled Nursing Facility | Attending: Internal Medicine

## 2024-06-04 DIAGNOSIS — J189 Pneumonia, unspecified organism: Secondary | ICD-10-CM | POA: Diagnosis not present

## 2024-06-04 DIAGNOSIS — R7881 Bacteremia: Secondary | ICD-10-CM | POA: Diagnosis not present

## 2024-06-04 DIAGNOSIS — N184 Chronic kidney disease, stage 4 (severe): Secondary | ICD-10-CM | POA: Diagnosis not present

## 2024-06-04 DIAGNOSIS — B952 Enterococcus as the cause of diseases classified elsewhere: Secondary | ICD-10-CM | POA: Diagnosis not present

## 2024-06-04 DIAGNOSIS — I129 Hypertensive chronic kidney disease with stage 1 through stage 4 chronic kidney disease, or unspecified chronic kidney disease: Secondary | ICD-10-CM | POA: Diagnosis not present

## 2024-06-04 HISTORY — PX: TEE WITHOUT CARDIOVERSION: SHX5443

## 2024-06-04 LAB — BASIC METABOLIC PANEL WITH GFR
Anion gap: 13 (ref 5–15)
BUN: 61 mg/dL — ABNORMAL HIGH (ref 8–23)
CO2: 17 mmol/L — ABNORMAL LOW (ref 22–32)
Calcium: 8.2 mg/dL — ABNORMAL LOW (ref 8.9–10.3)
Chloride: 103 mmol/L (ref 98–111)
Creatinine, Ser: 2.31 mg/dL — ABNORMAL HIGH (ref 0.44–1.00)
GFR, Estimated: 20 mL/min — ABNORMAL LOW (ref >60)
Glucose, Bld: 138 mg/dL — ABNORMAL HIGH (ref 70–99)
Potassium: 4.5 mmol/L (ref 3.5–5.1)
Sodium: 133 mmol/L — ABNORMAL LOW (ref 135–145)

## 2024-06-04 LAB — CBC
HCT: 34 % — ABNORMAL LOW (ref 36.0–46.0)
Hemoglobin: 11 g/dL — ABNORMAL LOW (ref 12.0–15.0)
MCH: 30.4 pg (ref 26.0–34.0)
MCHC: 32.4 g/dL (ref 30.0–36.0)
MCV: 93.9 fL (ref 80.0–100.0)
Platelets: 263 K/uL (ref 150–400)
RBC: 3.62 MIL/uL — ABNORMAL LOW (ref 3.87–5.11)
RDW: 18.8 % — ABNORMAL HIGH (ref 11.5–15.5)
WBC: 15.7 K/uL — ABNORMAL HIGH (ref 4.0–10.5)
nRBC: 0 % (ref 0.0–0.2)

## 2024-06-04 LAB — MRSA NEXT GEN BY PCR, NASAL: MRSA by PCR Next Gen: NOT DETECTED

## 2024-06-04 LAB — ECHO TEE

## 2024-06-04 SURGERY — ECHOCARDIOGRAM, TRANSESOPHAGEAL
Anesthesia: General

## 2024-06-04 MED ORDER — PHENYLEPHRINE 80 MCG/ML (10ML) SYRINGE FOR IV PUSH (FOR BLOOD PRESSURE SUPPORT)
PREFILLED_SYRINGE | INTRAVENOUS | Status: DC | PRN
  Administered 2024-06-04 (×3): 80 ug via INTRAVENOUS

## 2024-06-04 MED ORDER — EPHEDRINE SULFATE-NACL 50-0.9 MG/10ML-% IV SOSY
PREFILLED_SYRINGE | INTRAVENOUS | Status: DC | PRN
  Administered 2024-06-04: 5 mg via INTRAVENOUS
  Administered 2024-06-04 (×2): 10 mg via INTRAVENOUS

## 2024-06-04 MED ORDER — SODIUM BICARBONATE 650 MG PO TABS
1300.0000 mg | ORAL_TABLET | Freq: Three times a day (TID) | ORAL | Status: DC
  Administered 2024-06-04 – 2024-06-05 (×3): 1300 mg via ORAL
  Filled 2024-06-04 (×4): qty 2

## 2024-06-04 MED ORDER — PROPOFOL 10 MG/ML IV BOLUS
INTRAVENOUS | Status: DC | PRN
  Administered 2024-06-04 (×2): 20 mg via INTRAVENOUS
  Administered 2024-06-04: 30 mg via INTRAVENOUS

## 2024-06-04 MED ORDER — BUTAMBEN-TETRACAINE-BENZOCAINE 2-2-14 % EX AERO
INHALATION_SPRAY | CUTANEOUS | Status: AC
  Filled 2024-06-04: qty 5

## 2024-06-04 MED ORDER — LIDOCAINE VISCOUS HCL 2 % MT SOLN
OROMUCOSAL | Status: AC
  Filled 2024-06-04: qty 15

## 2024-06-04 MED ORDER — TORSEMIDE 20 MG PO TABS
20.0000 mg | ORAL_TABLET | Freq: Every day | ORAL | Status: DC
  Filled 2024-06-04: qty 1

## 2024-06-04 MED ORDER — SODIUM CHLORIDE 0.9 % IV BOLUS
250.0000 mL | Freq: Once | INTRAVENOUS | Status: AC
  Administered 2024-06-04: 250 mL via INTRAVENOUS

## 2024-06-04 MED ORDER — OXYCODONE-ACETAMINOPHEN 5-325 MG PO TABS
1.0000 | ORAL_TABLET | ORAL | Status: DC | PRN
  Administered 2024-06-04 – 2024-06-05 (×4): 1 via ORAL
  Filled 2024-06-04 (×6): qty 1

## 2024-06-04 MED ORDER — SUCRALFATE 1 GM/10ML PO SUSP
1.0000 g | Freq: Three times a day (TID) | ORAL | Status: DC
  Administered 2024-06-04 – 2024-06-05 (×5): 1 g via ORAL
  Filled 2024-06-04 (×5): qty 10

## 2024-06-04 NOTE — Progress Notes (Signed)
 MEWS Progress Note  Patient Details Name: DENELDA AKERLEY MRN: [id number] DOB: 20-Aug-1932 Today's Date: 06/04/2024   MEWS Flowsheet Documentation:  Assess: MEWS Score Temp: 97.8 F (36.6 C) BP: (!) 83/45 MAP (mmHg): (!) 58 Pulse Rate: 64 ECG Heart Rate: 70 Resp: 16 Level of Consciousness: Responds to Voice SpO2: 98 % O2 Device: Room Air Patient Activity (if Appropriate): In bed O2 Flow Rate (L/min): 2 L/min Assess: MEWS Score MEWS Temp: 0 MEWS Systolic: 1 MEWS Pulse: 0 MEWS RR: 0 MEWS LOC: 1 MEWS Score: 2 MEWS Score Color: Yellow Assess: SIRS CRITERIA SIRS Temperature : 0 SIRS Respirations : 0 SIRS Pulse: 0 SIRS WBC: 0 SIRS Score Sum : 0 Assess: if the MEWS score is Yellow or Red Were vital signs accurate and taken at a resting state?: Yes Does the patient meet 2 or more of the SIRS criteria?: No MEWS guidelines implemented : Yes, yellow Treat MEWS Interventions: Considered administering scheduled or prn medications/treatments as ordered Take Vital Signs Increase Vital Sign Frequency : Yellow: Q2hr x1, continue Q4hrs until patient remains green for 12hrs Escalate MEWS: Escalate: Yellow: Discuss with charge nurse and consider notifying provider and/or RRT  Will give bolus as ordered, will continue to monitor.      Rosaline A Maki Hege 06/04/2024, 5:10 PM

## 2024-06-04 NOTE — Anesthesia Preprocedure Evaluation (Addendum)
 Anesthesia Evaluation  Patient identified by MRN, date of birth, ID band Patient awake and Patient confused    Reviewed: Allergy & Precautions, NPO status , Patient's Chart, lab work & pertinent test results  History of Anesthesia Complications Negative for: history of anesthetic complications  Airway Mallampati: III  TM Distance: <3 FB Neck ROM: full    Dental  (+) Chipped   Pulmonary shortness of breath and with exertion, asthma , pneumonia, unresolved, COPD   + rhonchi  + decreased breath sounds      Cardiovascular Exercise Tolerance: Poor hypertension, +CHF and + DOE  Normal cardiovascular exam+ pacemaker      Neuro/Psych negative neurological ROS  negative psych ROS   GI/Hepatic negative GI ROS, Neg liver ROS,neg GERD  ,,  Endo/Other  diabetes, Type 2Hypothyroidism    Renal/GU Renal disease  negative genitourinary   Musculoskeletal   Abdominal   Peds  Hematology negative hematology ROS (+)   Anesthesia Other Findings Past Medical History: 11/24/2022: Acute kidney injury superimposed on CKD No date: Asthma No date: Atrial fibrillation with RVR (HCC) No date: [facility])     Comment:  Basal Cell No date: CHF (congestive heart failure) (HCC) 01/25/2017: Closed left hip fracture (HCC) 02/13/2023: Closed right hip fracture (HCC) No date: Diabetes mellitus without complication (HCC) 02/03/2015: Femur fracture, left (HCC) No date: Heart murmur No date: Hypertension No date: Impetigo No date: Osteoarthritis No date: Osteopenia 11/28/2022: Pacemaker lead malfunction 06/05/2022: Rapid atrial fibrillation, new onset(HCC) No date: Vomiting     Comment:  can not due to surgery No date: Wears dentures     Comment:  full upper and lower  Past Surgical History: No date: ABDOMINAL HYSTERECTOMY No date: BACK SURGERY No date: BLADDER SURGERY     Comment:  mesh No date: CARPAL TUNNEL RELEASE; Bilateral 2017:  CATARACT EXTRACTION; Right 02/06/2016: CATARACT EXTRACTION W/PHACO; Left     Comment:  Procedure: CATARACT EXTRACTION PHACO AND INTRAOCULAR               LENS PLACEMENT (IOC) left eye;  Surgeon: Donzell Arlyce Budd, MD;  Location: [facility];  Service:              Ophthalmology;  Laterality: Left;  DIABETIC LEFT Cannot              arrive before 9:30 No date: CERVICAL DISC SURGERY No date: CHOLECYSTECTOMY No date: EYE SURGERY; Bilateral     Comment:  Cataract Extraction with IOL 02/04/2015: FEMUR IM NAIL; Left     Comment:  Procedure: INTRAMEDULLARY (IM) NAIL FEMORAL;  Surgeon:               Franky Cranker, MD;  Location: [facility];  Service:               Orthopedics;  Laterality: Left; 01/17/2017: HARDWARE REMOVAL; Left     Comment:  Procedure: HARDWARE REMOVAL;  Surgeon: Cranker Franky,              MD;  Location: [facility];  Service: Orthopedics;                Laterality: Left; 2014: HERNIA REPAIR     Comment:  esophageal and gastric mesh. patient unable to throw up               d/t mesh 01/26/2017: HIP ARTHROPLASTY; Left     Comment:  Procedure:  ARTHROPLASTY BIPOLAR HIP (HEMIARTHROPLASTY)               removal hardware left hip;  Surgeon: Cleotilde Barrio, MD;               Location: [facility];  Service: Orthopedics;  Laterality:               Left; 02/13/2023: INTRAMEDULLARY (IM) NAIL INTERTROCHANTERIC; Right     Comment:  Procedure: INTRAMEDULLARY (IM) NAIL INTERTROCHANTERIC;                Surgeon: Edie Norleen PARAS, MD;  Location: [facility];                Service: Orthopedics;  Laterality: Right; No date: JOINT REPLACEMENT; Bilateral     Comment:  knees 11/15/2022: PACEMAKER IMPLANT; N/A     Comment:  Procedure: PACEMAKER IMPLANT;  Surgeon: Ammon Blunt, MD;  Location: [facility];  Service:              Cardiovascular;  Laterality: N/A; 11/28/2022: PACEMAKER IMPLANT; N/A     Comment:  Procedure: PACEMAKER IMPLANT;   Surgeon: Ammon Blunt, MD;  Location: [facility];  Service:              Cardiovascular;  Laterality: N/A;  Lead reposition [id number]: REPLACEMENT TOTAL KNEE BILATERAL; Bilateral 10/25/2016: SHOULDER ARTHROSCOPY WITH OPEN ROTATOR CUFF REPAIR AND  DISTAL CLAVICLE ACROMINECTOMY; Left     Comment:  Procedure: SHOULDER ARTHROSCOPY WITH OPEN ROTATOR CUFF               REPAIR AND DISTAL CLAVICLE ACROMINECTOMY;  Surgeon: Franky Cranker, MD;  Location: [facility];  Service:               Orthopedics;  Laterality: Left; No date: THYROID  SURGERY     Comment:  goiter removed 12/02/2017: TOTAL HIP REVISION; Left     Comment:  Procedure: TOTAL HIP REVISION;  Surgeon: Leora Lynwood SAUNDERS, MD;  Location: [facility];  Service: Orthopedics;                Laterality: Left; 2012: TOTAL SHOULDER REPLACEMENT; Right  BMI    Body Mass Index: 24.65 kg/m      Reproductive/Obstetrics negative OB ROS                              Anesthesia Physical Anesthesia Plan  ASA: 4  Anesthesia Plan: General   Post-op Pain Management:    Induction: Intravenous  PONV Risk Score and Plan: Propofol  infusion and TIVA  Airway Management Planned: Natural Airway and Nasal Cannula  Additional Equipment:   Intra-op Plan:   Post-operative Plan:   Informed Consent: I have reviewed the patients History and Physical, chart, labs and discussed the procedure including the risks, benefits and alternatives for the proposed anesthesia with the patient or authorized representative who has indicated his/her understanding and acceptance.   Patient has DNR.  Discussed DNR with power of attorney and Suspend DNR.   Dental Advisory Given  Plan Discussed with: Anesthesiologist, CRNA and Surgeon  Anesthesia Plan Comments: (History and phone  consent from the patients daughter [name] at [phone]. I discussed with her daughter that I have spoken  with the patients medicine and cardiac team. That they feel that this is the most that they can optimize the patient for this procedure. That there is a chance that we would have to abort the procedure if the patient cannot tolerate it from a pulmonary standpoint.  Daughter consented for risks of anesthesia including but not limited to:  - adverse reactions to medications - risk of airway placement if required - damage to eyes, teeth, lips or other oral mucosa - nerve damage due to positioning  - sore throat or hoarseness - Damage to heart, brain, nerves, lungs, other parts of body or loss of life  She voiced understanding and assent.)         Anesthesia Quick Evaluation

## 2024-06-04 NOTE — Progress Notes (Signed)
 Date of Admission:  06/01/2024     ID: Leslie Parker is a 89 y.o. female  Principal Problem:   Bacteremia due to Enterococcus Active Problems:   Diabetes mellitus without complication (HCC)   Chronic a-fib (HCC)   Essential hypertension   COPD (chronic obstructive pulmonary disease) (HCC)   Chronic diastolic CHF (congestive heart failure) (HCC)   CKD (chronic kidney disease) stage 4, GFR 15-29 ml/min (HCC)   Hypothyroidism   Community acquired pneumonia   Pacemaker    Subjective: Somnolent Had TEE   Medications:   amiodarone   200 mg Oral Daily   apixaban   2.5 mg Oral BID   ascorbic acid   500 mg Oral Daily   budesonide -glycopyrrolate -formoterol   2 puff Inhalation BID   cholecalciferol   1,000 Units Oral Daily   levothyroxine   50 mcg Oral Q0600   multivitamin with minerals  1 tablet Oral Daily   pantoprazole   40 mg Oral BID   sodium bicarbonate  1,300 mg Oral TID   sucralfate  1 g Oral TID WC & HS   torsemide   20 mg Oral Daily    Objective: Vital signs in last 24 hours: Patient Vitals for the past 24 hrs:  BP Temp Temp src Pulse Resp SpO2 Weight  06/04/24 1456 (!) 92/40 97.8 F (36.6 C) -- 70 10 98 % --  06/04/24 1430 (!) 92/57 -- -- 70 16 95 % --  06/04/24 1415 (!) 88/64 -- -- 66 15 96 % --  06/04/24 1400 (!) 89/49 -- -- 66 10 -- --  06/04/24 1345 (!) 88/51 -- -- 67 12 98 % --  06/04/24 1334 -- -- -- 69 13 100 % --  06/04/24 1333 (!) 93/42 -- -- 70 13 100 % --  06/04/24 1332 -- -- -- 69 12 100 % --  06/04/24 1331 -- -- -- 69 12 100 % --  06/04/24 1330 (!) 77/45 -- -- 68 12 100 % --  06/04/24 1329 -- -- -- 66 11 100 % --  06/04/24 1328 (!) 80/41 -- -- 67 10 100 % --  06/04/24 1327 -- -- -- 68 12 100 % --  06/04/24 1326 -- -- -- 69 12 100 % --  06/04/24 1325 (!) 81/47 -- -- -- 13 -- --  06/04/24 1324 -- -- -- -- 13 -- --  06/04/24 1323 (!) 61/48 -- -- -- 13 -- --  06/04/24 1322 -- -- -- 69 13 100 % --  06/04/24 1321 -- -- -- 70 11 (!) 85 % --  06/04/24 1320  (!) 82/38 -- -- -- 11 -- --  06/04/24 1313 (!) 86/47 -- -- 63 19 98 % --  06/04/24 1312 -- -- -- 64 14 99 % --  06/04/24 1311 (!) 86/44 -- -- 63 13 100 % --  06/04/24 1253 (!) 91/50 97.6 F (36.4 C) Temporal 66 15 98 % --  06/04/24 0731 (!) 100/59 98.6 F (37 C) Oral 66 16 97 % --  06/04/24 0615 (!) 99/47 -- -- 63 18 97 % --  06/04/24 0451 (!) 96/50 97.6 F (36.4 C) -- 64 18 96 % --  06/04/24 0426 -- -- -- -- -- -- 75.7 kg  06/03/24 2012 (!) 119/53 98.2 F (36.8 C) -- 62 18 94 % --       PHYSICAL EXAM:  General: somnolent after TEE Lungs: b/l air entry. Heart: s1s2 Abdomen: Soft, non-tender,not distended. Bowel sounds normal. No masses Extremities: atraumatic, no cyanosis. No edema.  No clubbing/l knee scar Skin:  bruising Lymph: Cervical, supraclavicular normal. Neurologic: Grossly non-focal  Lab Results    Latest Ref Rng & Units 06/04/2024    5:24 AM 06/03/2024    6:07 AM 06/02/2024    4:58 AM  CBC  WBC 4.0 - 10.5 K/uL 15.7  8.4  9.3   Hemoglobin 12.0 - 15.0 g/dL 88.9  9.7  89.4   Hematocrit 36.0 - 46.0 % 34.0  30.1  32.5   Platelets 150 - 400 K/uL 263  207  228        Latest Ref Rng & Units 06/04/2024    5:24 AM 06/03/2024    6:07 AM 06/02/2024    4:58 AM  CMP  Glucose 70 - 99 mg/dL 861  869  881   BUN 8 - 23 mg/dL 61  57  64   Creatinine 0.44 - 1.00 mg/dL 7.68  7.74  7.65   Sodium 135 - 145 mmol/L 133  135  136   Potassium 3.5 - 5.1 mmol/L 4.5  4.6  3.1   Chloride 98 - 111 mmol/L 103  104  103   CO2 22 - 32 mmol/L 17  20  21    Calcium 8.9 - 10.3 mg/dL 8.2  8.2  8.2   Total Protein 6.5 - 8.1 g/dL   5.3   Total Bilirubin 0.0 - 1.2 mg/dL   0.9   Alkaline Phos 38 - 126 U/L   103   AST 15 - 41 U/L   41   ALT 0 - 44 U/L   36       Microbiology: 11/10 2/2 sets of blood culture positive for enterococcus faecium Studies/Results: ECHOCARDIOGRAM COMPLETE Result Date: 06/03/2024    ECHOCARDIOGRAM REPORT   Patient Name:   Leslie Parker Date of Exam: 06/03/2024  Medical Rec #:  [id number]     Height:       69.0 in Accession #:    [id number]    Weight:       168.9 lb Date of Birth:  01/16/1933      BSA:          1.923 m Patient Age:    89 years      BP:           136/58 mmHg Patient Gender: F             HR:           64 bpm. Exam Location:  [facility] Procedure: 2D Echo, Cardiac Doppler and Color Doppler (Both Spectral and Color            Flow Doppler were utilized during procedure). Indications:     Bacteremia R78.81  History:         Patient has prior history of Echocardiogram examinations, most                  recent 01/21/2024. CHF; Risk Factors:Hypertension.  Sonographer:     Christopher Furnace Referring Phys:  [id number] AMY N [facility] Diagnosing Phys: [name] Lunger MD IMPRESSIONS  1. Left ventricular ejection fraction, by estimation, is 25 to 30%. Left ventricular ejection fraction by PLAX is 26 %. The left ventricle has severely decreased function. The left ventricle demonstrates global hypokinesis. The left ventricular internal  cavity size was moderately dilated. There is mild left ventricular hypertrophy. Left ventricular diastolic parameters are consistent with Grade I diastolic dysfunction (impaired relaxation).  2. Right ventricular systolic function is mildly reduced. The right  ventricular size is normal.  3. Moderate pleural effusion in the left lateral region.  4. The mitral valve is normal in structure. Mild mitral valve regurgitation. No evidence of mitral stenosis.  5. The aortic valve is calcified. Aortic valve regurgitation is not visualized. Aortic valve sclerosis/calcification is present, without any evidence of aortic stenosis. Aortic valve mean gradient measures 5.0 mmHg.  6. The inferior vena cava is normal in size with greater than 50% respiratory variability, suggesting right atrial pressure of 3 mmHg. FINDINGS  Left Ventricle: Left ventricular ejection fraction, by estimation, is 25 to 30%. Left ventricular ejection fraction by PLAX is 26 %. The left ventricle has  severely decreased function. The left ventricle demonstrates global hypokinesis. Strain was performed and the global longitudinal strain is indeterminate. The left ventricular internal cavity size was moderately dilated. There is mild left ventricular hypertrophy. Left ventricular diastolic parameters are consistent with Grade I diastolic dysfunction (impaired relaxation). Right Ventricle: The right ventricular size is normal. No increase in right ventricular wall thickness. Right ventricular systolic function is mildly reduced. Left Atrium: Left atrial size was normal in size. Right Atrium: Right atrial size was normal in size. Pericardium: There is no evidence of pericardial effusion. Mitral Valve: The mitral valve is normal in structure. Mild mitral annular calcification. Mild mitral valve regurgitation. No evidence of mitral valve stenosis. MV peak gradient, 4.8 mmHg. The mean mitral valve gradient is 2.0 mmHg. Tricuspid Valve: The tricuspid valve is normal in structure. Tricuspid valve regurgitation is mild . No evidence of tricuspid stenosis. Aortic Valve: The aortic valve is calcified. Aortic valve regurgitation is not visualized. Aortic valve sclerosis/calcification is present, without any evidence of aortic stenosis. Aortic valve mean gradient measures 5.0 mmHg. Aortic valve peak gradient measures 9.9 mmHg. Aortic valve area, by VTI measures 1.25 cm. Pulmonic Valve: The pulmonic valve was normal in structure. Pulmonic valve regurgitation is mild. No evidence of pulmonic stenosis. Aorta: The aortic root is normal in size and structure. Venous: The inferior vena cava is normal in size with greater than 50% respiratory variability, suggesting right atrial pressure of 3 mmHg. IAS/Shunts: No atrial level shunt detected by color flow Doppler. Additional Comments: 3D was performed not requiring image post processing on an independent workstation and was indeterminate. There is a moderate pleural effusion in the  left lateral region.  LEFT VENTRICLE PLAX 2D LV EF:         Left            Diastology                ventricular     LV e' medial:    3.96 cm/s                ejection        LV E/e' medial:  14.9                fraction by     LV e' lateral:   4.54 cm/s                PLAX is 26      LV E/e' lateral: 13.0                %. LVIDd:         5.18 cm LVIDs:         4.55 cm LV PW:         1.47 cm LV IVS:        1.57  cm LVOT diam:     2.00 cm LV SV:         39 LV SV Index:   20 LVOT Area:     3.14 cm LV IVRT:       72 msec  LV Volumes (MOD) LV vol d, MOD    94.1 ml A2C: LV vol d, MOD    132.0 ml A4C: LV vol s, MOD    54.1 ml A2C: LV vol s, MOD    89.5 ml A4C: LV SV MOD A2C:   40.0 ml LV SV MOD A4C:   132.0 ml LV SV MOD BP:    43.1 ml RIGHT VENTRICLE RV Basal diam:  4.50 cm RV Mid diam:    4.08 cm LEFT ATRIUM             Index        RIGHT ATRIUM           Index LA diam:        4.40 cm 2.29 cm/m   RA Area:     14.20 cm LA Vol (A2C):   77.1 ml 40.10 ml/m  RA Volume:   36.00 ml  18.72 ml/m LA Vol (A4C):   59.8 ml 31.10 ml/m LA Biplane Vol: 71.1 ml 36.98 ml/m  AORTIC VALVE AV Area (Vmax):    1.24 cm AV Area (Vmean):   1.22 cm AV Area (VTI):     1.25 cm AV Vmax:           157.00 cm/s AV Vmean:          104.133 cm/s AV VTI:            0.310 m AV Peak Grad:      9.9 mmHg AV Mean Grad:      5.0 mmHg LVOT Vmax:         62.10 cm/s LVOT Vmean:        40.400 cm/s LVOT VTI:          0.123 m LVOT/AV VTI ratio: 0.40  AORTA Ao Root diam: 3.30 cm MITRAL VALVE                TRICUSPID VALVE MV Area (PHT): 5.46 cm     TR Peak grad:   21.0 mmHg MV Area VTI:   1.58 cm     TR Vmax:        229.00 cm/s MV Peak grad:  4.8 mmHg MV Mean grad:  2.0 mmHg     SHUNTS MV Vmax:       1.09 m/s     Systemic VTI:  0.12 m MV Vmean:      66.0 cm/s    Systemic Diam: 2.00 cm MV Decel Time: 139 msec MV E velocity: 59.20 cm/s MV A velocity: 111.00 cm/s MV E/A ratio:  0.53 [name] Lunger MD Electronically signed by [name] Lunger MD Signature  Date/Time: 06/03/2024/5:17:44 PM    Final      Assessment/Plan: Enterococcus Faecium  bacteremia in a patient with pacemaker.  There is concern for endocarditis/device infection   2D echo done was okay TEE done today did npt reveal any vegetation or thrombus Patient is currently on vancomycin  and also ceftriaxone .Susceptibility still not available. As no endocarditis can DC ceftriaxone  Because of enterococcus bacteremia of  unclear source and having a pacemaker- it will have to be removed but she is frail and because of her age may not be able to remove pacemaker The decision will have to  be made whether to treat with prolonged course IV antibiotic for  endocarditis or do 2 weeks IV antibiotic and do surveillance culture / will discuss with her daughter   Bilateral pleural effusion bibasilar consolidation likely congestive heart failure On torsemide  and Coreg . Chronic A-fib rate well-controlled On amiodarone  and Eliquis   CKD  Hypertension on hydralazine   Hypothyroidism on Synthroid   Discussed the management with the care team Care of an infected condition was provided by an ID specialist today

## 2024-06-04 NOTE — Progress Notes (Signed)
 [facility] CLINIC CARDIOLOGY PROGRESS NOTE       Patient ID: Leslie Parker MRN: [id number] DOB/AGE: [birth year] 89 y.o.  Admit date: 06/01/2024 Referring Physician Dr. Murvin Mana Primary Physician Sherial Bail, MD  Primary Cardiologist Dr. Florencio Reason for Consultation bacteremia, need for TEE  HPI: Leslie Parker is a 89 y.o. female  with a past medical history of chronic HFrEF (EF 25-30% - 07/2023), paroxymal atrial fibrillation (on Eliquis , PO amio), SSS s/p [employer] dual chamber PPM (11/15/2022), hypertension,  type 2 diabetes mellitus, severe COPD stage 3 by GOLD classification, CKG stage 4 who presented to the ED on 06/01/2024 for redness.  Blood cultures positive for Enterococcus.  Cardiology was consulted for need for TEE.   Interval history: -Patient seen and examined this AM, resting comfortably in hospital bed.  -States she is feeling well overall today.  Feels her shortness of breath is stable. -Discussed TEE procedure again this morning, patient states that she does not recall talking with me about this yesterday but is amenable to proceeding after discussion.  Consent was obtained by the patient's daughter given this. -Hemoglobin and platelets stable today.  No significant changes to labs otherwise.  Review of systems complete and found to be negative unless listed above    Past Medical History:  Diagnosis Date   Acute kidney injury superimposed on CKD 11/24/2022   Asthma    Atrial fibrillation with RVR (HCC)    Cancer (HCC)    Basal Cell   CHF (congestive heart failure) (HCC)    Closed left hip fracture (HCC) 01/25/2017   Closed right hip fracture (HCC) 02/13/2023   Diabetes mellitus without complication (HCC)    Femur fracture, left (HCC) 02/03/2015   Heart murmur    Hypertension    Impetigo    Osteoarthritis    Osteopenia    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   Rapid atrial fibrillation, new onset(HCC) 06/05/2022   Vomiting    can not due  to surgery   Wears dentures    full upper and lower    Past Surgical History:  Procedure Laterality Date   ABDOMINAL HYSTERECTOMY     BACK SURGERY     BLADDER SURGERY     mesh   CARPAL TUNNEL RELEASE Bilateral    CATARACT EXTRACTION Right 2017   CATARACT EXTRACTION W/PHACO Left 02/06/2016   Procedure: CATARACT EXTRACTION PHACO AND INTRAOCULAR LENS PLACEMENT (IOC) left eye;  Surgeon: Donzell Arlyce Budd, MD;  Location: [facility];  Service: Ophthalmology;  Laterality: Left;  DIABETIC LEFT Cannot arrive before 9:30   CERVICAL DISC SURGERY     CHOLECYSTECTOMY     EYE SURGERY Bilateral    Cataract Extraction with IOL   FEMUR IM NAIL Left 02/04/2015   Procedure: INTRAMEDULLARY (IM) NAIL FEMORAL;  Surgeon: Franky Cranker, MD;  Location: [facility] ORS;  Service: Orthopedics;  Laterality: Left;   HARDWARE REMOVAL Left 01/17/2017   Procedure: HARDWARE REMOVAL;  Surgeon: Cranker Franky, MD;  Location: [facility] ORS;  Service: Orthopedics;  Laterality: Left;   HERNIA REPAIR  2014   esophageal and gastric mesh. patient unable to throw up d/t mesh   HIP ARTHROPLASTY Left 01/26/2017   Procedure: ARTHROPLASTY BIPOLAR HIP (HEMIARTHROPLASTY) removal hardware left hip;  Surgeon: Cleotilde Barrio, MD;  Location: [facility] OR[facility];  Service: Orthopedics;  Laterality: Left;   INTRAMEDULLARY (IM) NAIL INTERTROCHANTERIC Right 02/13/2023   Procedure: INTRAMEDULLARY (IM) NAIL INTERTROCHANTERIC;  Surgeon: Edie Norleen PARAS, MD;  Location: [facility] OR[facility];  Service: Orthopedics;  Laterality: Right;   JOINT REPLACEMENT Bilateral    knees   PACEMAKER IMPLANT N/A 11/15/2022   Procedure: PACEMAKER IMPLANT;  Surgeon: Ammon Blunt, MD;  Location: [facility] INVASIVE [facility];  Service: Cardiovascular;  Laterality: N/A;   PACEMAKER IMPLANT N/A 11/28/2022   Procedure: PACEMAKER IMPLANT;  Surgeon: Ammon Blunt, MD;  Location: [facility]AS[facility];  Service: Cardiovascular;  Laterality: N/A;  Lead reposition   REPLACEMENT TOTAL KNEE  BILATERAL Bilateral [id number]   SHOULDER ARTHROSCOPY WITH OPEN ROTATOR CUFF REPAIR AND DISTAL CLAVICLE ACROMINECTOMY Left 10/25/2016   Procedure: SHOULDER ARTHROSCOPY WITH OPEN ROTATOR CUFF REPAIR AND DISTAL CLAVICLE ACROMINECTOMY;  Surgeon: Franky Cranker, MD;  Location: [facility] OR[facility];  Service: Orthopedics;  Laterality: Left;   THYROID  SURGERY     goiter removed   TOTAL HIP REVISION Left 12/02/2017   Procedure: TOTAL HIP REVISION;  Surgeon: Leora Lynwood SAUNDERS, MD;  Location: [facility] ORS;  Service: Orthopedics;  Laterality: Left;   TOTAL SHOULDER REPLACEMENT Right 2012    Medications Prior to Admission  Medication Sig Dispense Refill Last Dose/Taking   acetaminophen  (TYLENOL ) 325 MG tablet Take 2 tablets (650 mg total) by mouth every 8 (eight) hours as needed for fever, headache or mild pain (pain score 1-3).   06/01/2024 Morning   albuterol  (VENTOLIN  HFA) 108 (90 Base) MCG/ACT inhaler Inhale 2 puffs into the lungs every 6 (six) hours as needed.   Taking As Needed   alum & mag hydroxide-simeth (MAALOX/MYLANTA) 200-200-20 MG/5ML suspension Take 30 mLs by mouth every 6 (six) hours as needed for indigestion or heartburn.   Taking As Needed   amiodarone  (PACERONE ) 200 MG tablet Take 1 tablet by mouth daily.   05/31/2024   apixaban  (ELIQUIS ) 2.5 MG TABS tablet Take 1 tablet (2.5 mg total) by mouth 2 (two) times daily. 60 tablet 1 05/31/2024   ascorbic acid  (VITAMIN C ) 500 MG tablet Take 500 mg by mouth daily.   05/31/2024   carvedilol  (COREG ) 3.125 MG tablet Take 1 tablet (3.125 mg total) by mouth 2 (two) times daily with a meal. Reduced from 6.25 mg.   05/31/2024   Cholecalciferol  (VITAMIN D -1000 MAX ST) 25 MCG (1000 UT) tablet Take 1,000 Units by mouth daily.   05/31/2024   guaiFENesin -dextromethorphan  (ROBITUSSIN DM) 100-10 MG/5ML syrup Take 10 mLs by mouth every 6 (six) hours as needed for cough.   Taking As Needed   hydrALAZINE  (APRESOLINE ) 25 MG tablet 25 mg 3 (three) times daily. 0800/1400/2000   05/31/2024    levothyroxine  (SYNTHROID ) 25 MCG tablet Take 2 tablets (50 mcg total) by mouth daily at 6 (six) AM. (Patient taking differently: Take 75 mcg by mouth daily at 6 (six) AM.)   06/01/2024 Morning   Multiple Vitamin (MULTIVITAMIN WITH MINERALS) TABS tablet Take 1 tablet by mouth daily. One-A-Day Women's Vitamin   05/31/2024   ondansetron  (ZOFRAN ) 4 MG tablet Take 1 tablet (4 mg total) by mouth every 6 (six) hours as needed for nausea. 20 tablet 0 06/01/2024 Morning   pantoprazole  (PROTONIX ) 40 MG tablet Take 1 tablet (40 mg total) by mouth 2 (two) times daily. 60 tablet 0 05/31/2024   polyethylene glycol (MIRALAX  / GLYCOLAX ) 17 g packet Take 17 g by mouth daily as needed.   Taking As Needed   senna-docusate (SENOKOT-S) 8.6-50 MG tablet Take 2 tablets by mouth at bedtime as needed for mild constipation.   Taking As Needed   sucralfate (CARAFATE) 1 g tablet Take 1 g by mouth 3 (three) times daily.  05/31/2024   torsemide  (DEMADEX ) 20 MG tablet Take 40 mg by mouth daily as needed.   05/27/2024   TRELEGY ELLIPTA 100-62.5-25 MCG/ACT AEPB Inhale 1 puff into the lungs daily.   05/31/2024   melatonin 5 MG TABS Take 1 tablet (5 mg total) by mouth at bedtime as needed. (Patient not taking: Reported on 06/01/2024)   Not Taking   Social History   Socioeconomic History   Marital status: Divorced    Spouse name: Not on file   Number of children: 4   Years of education: Not on file   Highest education level: 12th grade  Occupational History   Occupation: Retitred/Disability  Tobacco Use   Smoking status: Never   Smokeless tobacco: Never  Vaping Use   Vaping status: Never Used  Substance and Sexual Activity   Alcohol use: No    Alcohol/week: 0.0 standard drinks of alcohol   Drug use: No   Sexual activity: Not Currently  Other Topics Concern   Not on file  Social History Narrative   Not on file   Social Drivers of Health   Financial Resource Strain: Low Risk  (01/13/2024)   Received from [facility]   Overall Financial Resource Strain (CARDIA)    Difficulty of Paying Living Expenses: Not hard at all  Food Insecurity: No Food Insecurity (06/01/2024)   Hunger Vital Sign    Worried About Running Out of Food in the Last Year: Never true    Ran Out of Food in the Last Year: Never true  Transportation Needs: No Transportation Needs (06/01/2024)   PRAPARE - Administrator, Civil Service (Medical): No    Lack of Transportation (Non-Medical): No  Physical Activity: Not on file  Stress: Not on file  Social Connections: Moderately Isolated (06/01/2024)   Social Connection and Isolation Panel    Frequency of Communication with Friends and Family: More than three times a week    Frequency of Social Gatherings with Friends and Family: Three times a week    Attends Religious Services: 1 to 4 times per year    Active Member of Clubs or Organizations: No    Attends Banker Meetings: Never    Marital Status: Divorced  Catering Manager Violence: Not At Risk (06/01/2024)   Humiliation, Afraid, Rape, and Kick questionnaire    Fear of Current or Ex-Partner: No    Emotionally Abused: No    Physically Abused: No    Sexually Abused: No    Family History  Problem Relation Age of Onset   Heart failure Mother    Hypertension Mother    Emphysema Father      Vitals:   06/04/24 0426 06/04/24 0451 06/04/24 0615 06/04/24 0731  BP:  (!) 96/50 (!) 99/47 (!) 100/59  Pulse:  64 63 66  Resp:  18 18 16   Temp:  97.6 F (36.4 C)  98.6 F (37 C)  TempSrc:    Oral  SpO2:  96% 97% 97%  Weight: 75.7 kg     Height:        PHYSICAL EXAM General: Chronically ill-appearing elderly female, well nourished, in no acute distress. HEENT: Normocephalic and atraumatic. Neck: No JVD.  Lungs: Normal respiratory effort on room air. Clear bilaterally to auscultation. No wheezes, crackles, rhonchi.  Heart: HRRR. Normal S1 and S2 without gallops or murmurs.  Abdomen: Non-distended  appearing.  Msk: Normal strength and tone for age. Extremities: Warm and well perfused. No clubbing, cyanosis.  No edema.  Neuro: Alert and oriented X 3. Psych: Answers questions appropriately.   Labs: Basic Metabolic Panel: Recent Labs    06/02/24 0458 06/03/24 0607 06/04/24 0524  NA 136 135 133*  K 3.1* 4.6 4.5  CL 103 104 103  CO2 21* 20* 17*  GLUCOSE 118* 130* 138*  BUN 64* 57* 61*  CREATININE 2.34* 2.25* 2.31*  CALCIUM 8.2* 8.2* 8.2*  MG 1.8  --   --    Liver Function Tests: Recent Labs    06/02/24 0458  AST 41  ALT 36  ALKPHOS 103  BILITOT 0.9  PROT 5.3*  ALBUMIN  2.2*   No results for input(s): LIPASE, AMYLASE in the last 72 hours. CBC: Recent Labs    06/03/24 0607 06/04/24 0524  WBC 8.4 15.7*  HGB 9.7* 11.0*  HCT 30.1* 34.0*  MCV 96.5 93.9  PLT 207 263   Cardiac Enzymes: No results for input(s): CKTOTAL, CKMB, CKMBINDEX, TROPONINIHS in the last 72 hours.  BNP: No results for input(s): BNP in the last 72 hours.  D-Dimer: No results for input(s): DDIMER in the last 72 hours. Hemoglobin A1C: No results for input(s): HGBA1C in the last 72 hours. Fasting Lipid Panel: No results for input(s): CHOL, HDL, LDLCALC, TRIG, CHOLHDL, LDLDIRECT in the last 72 hours. Thyroid  Function Tests: No results for input(s): TSH, T4TOTAL, T3FREE, THYROIDAB in the last 72 hours.  Invalid input(s): FREET3 Anemia Panel: No results for input(s): VITAMINB12, FOLATE, FERRITIN, TIBC, IRON, RETICCTPCT in the last 72 hours.   Radiology: ECHOCARDIOGRAM COMPLETE Result Date: 06/03/2024    ECHOCARDIOGRAM REPORT   Patient Name:   Leslie Parker Vigil Date of Exam: 06/03/2024 Medical Rec #:  [id number]     Height:       69.0 in Accession #:    [id number]    Weight:       168.9 lb Date of Birth:  [birth year]      BSA:          1.923 m Patient Age:    89 years      BP:           136/58 mmHg Patient Gender: F             HR:           64 bpm.  Exam Location:  [facility] Procedure: 2D Echo, Cardiac Doppler and Color Doppler (Both Spectral and Color            Flow Doppler were utilized during procedure). Indications:     Bacteremia R78.81  History:         Patient has prior history of Echocardiogram examinations, most                  recent 01/21/2024. CHF; Risk Factors:Hypertension.  Sonographer:     Christopher Furnace Referring Phys:  [id number] AMY N COX Diagnosing Phys: [name] Lunger MD IMPRESSIONS  1. Left ventricular ejection fraction, by estimation, is 25 to 30%. Left ventricular ejection fraction by PLAX is 26 %. The left ventricle has severely decreased function. The left ventricle demonstrates global hypokinesis. The left ventricular internal  cavity size was moderately dilated. There is mild left ventricular hypertrophy. Left ventricular diastolic parameters are consistent with Grade I diastolic dysfunction (impaired relaxation).  2. Right ventricular systolic function is mildly reduced. The right ventricular size is normal.  3. Moderate pleural effusion in the left lateral region.  4. The mitral valve is normal in structure. Mild mitral valve regurgitation.  No evidence of mitral stenosis.  5. The aortic valve is calcified. Aortic valve regurgitation is not visualized. Aortic valve sclerosis/calcification is present, without any evidence of aortic stenosis. Aortic valve mean gradient measures 5.0 mmHg.  6. The inferior vena cava is normal in size with greater than 50% respiratory variability, suggesting right atrial pressure of 3 mmHg. FINDINGS  Left Ventricle: Left ventricular ejection fraction, by estimation, is 25 to 30%. Left ventricular ejection fraction by PLAX is 26 %. The left ventricle has severely decreased function. The left ventricle demonstrates global hypokinesis. Strain was performed and the global longitudinal strain is indeterminate. The left ventricular internal cavity size was moderately dilated. There is mild left ventricular hypertrophy.  Left ventricular diastolic parameters are consistent with Grade I diastolic dysfunction (impaired relaxation). Right Ventricle: The right ventricular size is normal. No increase in right ventricular wall thickness. Right ventricular systolic function is mildly reduced. Left Atrium: Left atrial size was normal in size. Right Atrium: Right atrial size was normal in size. Pericardium: There is no evidence of pericardial effusion. Mitral Valve: The mitral valve is normal in structure. Mild mitral annular calcification. Mild mitral valve regurgitation. No evidence of mitral valve stenosis. MV peak gradient, 4.8 mmHg. The mean mitral valve gradient is 2.0 mmHg. Tricuspid Valve: The tricuspid valve is normal in structure. Tricuspid valve regurgitation is mild . No evidence of tricuspid stenosis. Aortic Valve: The aortic valve is calcified. Aortic valve regurgitation is not visualized. Aortic valve sclerosis/calcification is present, without any evidence of aortic stenosis. Aortic valve mean gradient measures 5.0 mmHg. Aortic valve peak gradient measures 9.9 mmHg. Aortic valve area, by VTI measures 1.25 cm. Pulmonic Valve: The pulmonic valve was normal in structure. Pulmonic valve regurgitation is mild. No evidence of pulmonic stenosis. Aorta: The aortic root is normal in size and structure. Venous: The inferior vena cava is normal in size with greater than 50% respiratory variability, suggesting right atrial pressure of 3 mmHg. IAS/Shunts: No atrial level shunt detected by color flow Doppler. Additional Comments: 3D was performed not requiring image post processing on an independent workstation and was indeterminate. There is a moderate pleural effusion in the left lateral region.  LEFT VENTRICLE PLAX 2D LV EF:         Left            Diastology                ventricular     LV e' medial:    3.96 cm/s                ejection        LV E/e' medial:  14.9                fraction by     LV e' lateral:   4.54 cm/s                 PLAX is 26      LV E/e' lateral: 13.0                %. LVIDd:         5.18 cm LVIDs:         4.55 cm LV PW:         1.47 cm LV IVS:        1.57 cm LVOT diam:     2.00 cm LV SV:         39 LV SV Index:   20 LVOT  Area:     3.14 cm LV IVRT:       72 msec  LV Volumes (MOD) LV vol d, MOD    94.1 ml A2C: LV vol d, MOD    132.0 ml A4C: LV vol s, MOD    54.1 ml A2C: LV vol s, MOD    89.5 ml A4C: LV SV MOD A2C:   40.0 ml LV SV MOD A4C:   132.0 ml LV SV MOD BP:    43.1 ml RIGHT VENTRICLE RV Basal diam:  4.50 cm RV Mid diam:    4.08 cm LEFT ATRIUM             Index        RIGHT ATRIUM           Index LA diam:        4.40 cm 2.29 cm/m   RA Area:     14.20 cm LA Vol (A2C):   77.1 ml 40.10 ml/m  RA Volume:   36.00 ml  18.72 ml/m LA Vol (A4C):   59.8 ml 31.10 ml/m LA Biplane Vol: 71.1 ml 36.98 ml/m  AORTIC VALVE AV Area (Vmax):    1.24 cm AV Area (Vmean):   1.22 cm AV Area (VTI):     1.25 cm AV Vmax:           157.00 cm/s AV Vmean:          104.133 cm/s AV VTI:            0.310 m AV Peak Grad:      9.9 mmHg AV Mean Grad:      5.0 mmHg LVOT Vmax:         62.10 cm/s LVOT Vmean:        40.400 cm/s LVOT VTI:          0.123 m LVOT/AV VTI ratio: 0.40  AORTA Ao Root diam: 3.30 cm MITRAL VALVE                TRICUSPID VALVE MV Area (PHT): 5.46 cm     TR Peak grad:   21.0 mmHg MV Area VTI:   1.58 cm     TR Vmax:        229.00 cm/s MV Peak grad:  4.8 mmHg MV Mean grad:  2.0 mmHg     SHUNTS MV Vmax:       1.09 m/s     Systemic VTI:  0.12 m MV Vmean:      66.0 cm/s    Systemic Diam: 2.00 cm MV Decel Time: 139 msec MV E velocity: 59.20 cm/s MV A velocity: 111.00 cm/s MV E/A ratio:  0.53 [name] Lunger MD Electronically signed by [name] Lunger MD Signature Date/Time: 06/03/2024/5:17:44 PM    Final    DG Abd 1 View Result Date: 06/02/2024 EXAM: 1 VIEW XRAY OF THE ABDOMEN 06/02/2024 11:44:00 AM COMPARISON: 02/28/2024 CLINICAL HISTORY: Abdominal pain [id number]; [id number] Constipation [id number] FINDINGS: BOWEL: Moderately dilated loop  of small bowel in the left upper quadrant, concerning for ileus or possibly small bowel obstruction. Mild amount of stool is noted throughout the nondilated colon. SOFT TISSUES: No opaque urinary calculi. BONES: No acute osseous abnormality. IMPRESSION: 1. Moderately dilated loop of small bowel in the left upper quadrant, worrisome for ileus versus small-bowel obstruction. Electronically signed by: Lynwood Seip MD 06/02/2024 11:54 AM EST RP Workstation: [id number]   DG Chest Portable 1 View Result Date: 06/01/2024 CLINICAL DATA:  Cough and shortness of breath.  EXAM: PORTABLE CHEST 1 VIEW COMPARISON:  04/13/2024 FINDINGS: The cardio pericardial silhouette is enlarged. Bibasilar collapse/consolidation with small to moderate bilateral pleural effusions, progressive in the interval. Left permanent pacemaker again noted. Telemetry leads overlie the chest. IMPRESSION: Bibasilar collapse/consolidation with small to moderate bilateral pleural effusions, progressive in the interval. Electronically Signed   By: Camellia Candle M.D.   On: 06/01/2024 06:24    ECHO as above  TELEMETRY (personally reviewed): Ventricular pacing rate 60s  EKG (personally reviewed): Ventricular pacing rate 75 bpm  Data reviewed by me 06/04/2024: last 24h vitals tele labs imaging I/O ED provider note, admission H&P, hospitalist progress note, ID notes  Principal Problem:   Bacteremia due to Enterococcus Active Problems:   Diabetes mellitus without complication (HCC)   Chronic a-fib (HCC)   Essential hypertension   COPD (chronic obstructive pulmonary disease) (HCC)   Chronic diastolic CHF (congestive heart failure) (HCC)   CKD (chronic kidney disease) stage 4, GFR 15-29 ml/min (HCC)   Hypothyroidism   Community acquired pneumonia   Pacemaker    ASSESSMENT AND PLAN:  Leslie Parker is a 89 y.o. female  with a past medical history of chronic HFrEF (EF 25-30% - 07/2023), paroxymal atrial fibrillation (on Eliquis , PO amio), SSS  s/p [employer] dual chamber PPM (11/15/2022), hypertension,  type 2 diabetes mellitus, severe COPD stage 3 by GOLD classification, CKG stage 4 who presented to the ED on 06/01/2024 for redness.  Blood cultures positive for Enterococcus.  Cardiology was consulted for need for TEE.   # Enterococcus faecium bacteremia # Community-acquired pneumonia # Chronic HFrEF (25-30%) # Paroxymal atrial fibrillation # SSS s/p dual chamber PPM (10/2022) # Chronic LBBB Patient with significant past cardiac history presented with weakness.  Found to have pneumonia and Enterococcus bacteremia on blood cultures.  Endorses some shortness of breath and BNP is elevated but this is commonly elevated and she has history of poorly controlled heart failure.  Otherwise has no cardiac complaints.  Pacemaker functioning appropriately on telemetry.  ID has requested TEE to rule out endocarditis. - TEE procedure discussed in detail with patient again today.  All questions answered.  She is amenable to proceeding with this, it has been scheduled for today around 1 PM.  I also called and spoke with patient's daughter to discuss the plan, all questions answered and she is amenable as well.  Consent obtained via patient's daughter. -Continue carvedilol  3.125 mg twice daily, hydralazine  25 mg twice daily, torsemide  40 mg. - Continue Eliquis  2.5 mg twice daily, amiodarone  200 mg daily.  This patient's plan of care was discussed and created with Dr. Ammon and he is in agreement.  Signed: Danita Bloch, PA-C  06/04/2024, 10:46 AM [facility]

## 2024-06-04 NOTE — TOC Progression Note (Signed)
 Transition of Care [facility]) - Progression Note    Patient Details  Name: Leslie Parker MRN: [id number] Date of Birth: January 16, 1933  [facility] CM/SW Contact  Marinda Cooks, RN Phone Number: 06/04/2024, 12:08 PM  Clinical Narrative:    This CM confirmed with admission liaison at [facility] pt is a resident at facility and can return when medially cleared . TOC will cont to follow dc planning / care coordination and update as applicable.    Expected Discharge Plan: Skilled Nursing Facility Barriers to Discharge: Continued Medical Work up    Expected Discharge Plan and Services In-house Referral: Clinical Social Work   Social Drivers of Health (SDOH) Interventions SDOH Screenings   Food Insecurity: No Food Insecurity (06/01/2024)  Housing: Low Risk  (06/01/2024)  Transportation Needs: No Transportation Needs (06/01/2024)  Utilities: Not At Risk (06/01/2024)  Alcohol Screen: Low Risk  (08/20/2023)  Financial Resource Strain: Low Risk  (01/13/2024)   Received from [facility]  Social Connections: Moderately Isolated (06/01/2024)  Tobacco Use: Low Risk  (06/01/2024)    Readmission Risk Interventions    10/22/2023    2:28 PM 09/16/2023    2:52 PM 08/20/2023    3:45 PM  Readmission Risk Prevention Plan  Transportation Screening Complete Complete Complete  Medication Review Oceanographer) Complete Complete Complete  PCP or Specialist appointment within 3-5 days of discharge  Complete Complete  HRI or Home Care Consult Not Complete Patient refused Complete  HRI or Home Care Consult Pt Refusal Comments No PT recs at this time.    SW Recovery Care/Counseling Consult Complete Complete Complete  Palliative Care Screening Not Applicable Not Applicable Not Applicable  Skilled Nursing Facility Not Applicable Not Applicable Not Applicable

## 2024-06-04 NOTE — Progress Notes (Signed)
 Physical Therapy Treatment Patient Details Name: Leslie Parker MRN: [id number] DOB: Jul 16, 1933 Today's Date: 06/04/2024   History of Present Illness Pt is an 89 year old female admitted with Bacteremia due to Enterococcus, CAP    PMH significant for  atrial fibrillation amiodarone  and apixaban , heart failure, pacemaker status, hypertension, GERD, hypothyroid, CKD stage IV    PT Comments  Pt lethargic, maintained eyes closed for majority of session but arouses to name/tactile stimulation. Inconsistent verbal communication/response to questions this date. Pt was received in bed and was found to have an episode of bowel/bladder incontinence. RN entered room to assist with pericare and bed linen change. Pt required totalAx2 to complete rolling in bed, pt able to utilize UE support on bed rail to support upper body in sidelying, ultimately maxA to maintain full sidelying position. Pt noted to have poor overall tolerance to all mobility efforts this date, was crying out in pain with all mobility. Additional mobility deferred due to pt's pain and poor tolerance to mobility. Pt was left supine in bed at end of session with all needs in reach. The patient would benefit from further skilled PT intervention to continue to progress towards goals as able.      If plan is discharge home, recommend the following: Two people to help with bathing/dressing/bathroom;Two people to help with walking and/or transfers   Can travel by private vehicle     No  Equipment Recommendations  Other (comment) (TBD)    Recommendations for Other Services       Precautions / Restrictions Precautions Precautions: Fall;ICD/Pacemaker Recall of Precautions/Restrictions: Impaired Restrictions Weight Bearing Restrictions Per Provider Order: No     Mobility  Bed Mobility Overal bed mobility: Needs Assistance Bed Mobility: Rolling Rolling: Total assist, +2 for physical assistance, Used rails         General bed mobility  comments: Pt required totalA to roll to both sides, poor tolerance to all mobility. Able to hold onto bed rail with BUE in sitdelying, ultimately maxA to maintain sidelying position    Transfers                   General transfer comment: pt unable to tolerate due to pain, deferred at this time    Ambulation/Gait               General Gait Details: pt unable to tolerate due to pain, deferred at this time   Stairs             Wheelchair Mobility     Tilt Bed    Modified Rankin (Stroke Patients Only)       Balance                                            Communication Communication Communication: Impaired Factors Affecting Communication: Difficulty expressing self;Reduced clarity of speech  Cognition Arousal: Lethargic Behavior During Therapy: Flat affect   PT - Cognitive impairments: No family/caregiver present to determine baseline, Difficult to assess Difficult to assess due to: Level of arousal                     PT - Cognition Comments: Oriented to self, inconsistent verbal response to questions, maintained eyes closed for majority of session Following commands: Impaired Following commands impaired: Follows one step commands inconsistently, Follows one step commands with increased time  Cueing Cueing Techniques: Verbal cues, Tactile cues, Visual cues  Exercises      General Comments General comments (skin integrity, edema, etc.): RN entered room during session to assist with pericare/bed linen replacement.      Pertinent Vitals/Pain Pain Assessment Pain Assessment: Faces Faces Pain Scale: Hurts worst Pain Location: stomach Pain Descriptors / Indicators: Crying, Grimacing, Moaning Pain Intervention(s): Limited activity within patient's tolerance, Monitored during session, Repositioned, Patient requesting pain meds-RN notified    Home Living                          Prior Function             PT Goals (current goals can now be found in the care plan section) Progress towards PT goals: Not progressing toward goals - comment    Frequency    Min 1X/week      PT Plan      Co-evaluation              AM-PAC PT 6 Clicks Mobility   Outcome Measure  Help needed turning from your back to your side while in a flat bed without using bedrails?: Total Help needed moving from lying on your back to sitting on the side of a flat bed without using bedrails?: Total Help needed moving to and from a bed to a chair (including a wheelchair)?: Total Help needed standing up from a chair using your arms (e.g., wheelchair or bedside chair)?: Total Help needed to walk in hospital room?: Total Help needed climbing 3-5 steps with a railing? : Total 6 Click Score: 6    End of Session   Activity Tolerance: Patient limited by lethargy;Patient limited by pain Patient left: in bed;with call bell/phone within reach;with bed alarm set;with SCD's reapplied Nurse Communication: Mobility status;Patient requests pain meds PT Visit Diagnosis: Muscle weakness (generalized) (M62.81);Pain;Difficulty in walking, not elsewhere classified (R26.2) Pain - part of body:  (stomach)     Time: 8876-8848 PT Time Calculation (min) (ACUTE ONLY): 28 min  Charges:    $Therapeutic Activity: 8-22 mins PT General Charges $$ ACUTE PT VISIT: 1 Visit                     Roza Creamer, SPT

## 2024-06-04 NOTE — Consult Note (Signed)
 Consultation Note Date: 06/04/2024 at 1100  Patient Name: Leslie Parker  DOB: [birth year]  MRN: [id number]  Age / Sex: 89 y.o., female  PCP: Sherial Bail, MD Referring Physician: Laurita Pillion, MD  HPI/Patient Profile: 89 y.o. female  with past medical history of HFrEF with EF of 25 to 30%, SSS s/p PPM, A-fib (Eliquis ), HTN, GERD, hypothyroidism, HLD, 2 diabetes, COPD, CKD (stage IV) admitted on 06/01/2024 with cough, congestion, shortness of breath for 1 week.  Of note, this is patient's sixth inpatient admission in the last 6 months.  Upon arrival to ED, patient found to be hypertensive (173/81), tachypneic (RR 24), and oxygen saturation of 93% on room air.  Patient was given antibiotics, cultures were sent.  Viral panel negative.  Radiograph of chest revealed bilateral lower field consolidation with a small pleural effusion.  Blood cultures grew Enterococcus faecium.  Patient is being treated for bilateral lower lobe pneumonia as well as bacteremia due to Enterococcus.  PMT was consulted to support patient and family with goals of care discussions.  Of note, patient is familiar to PMT as palliative team has been consulted to support patient during previous hospitalizations.  Clinical Assessment and Goals of Care: Extensive chart review completed prior to meeting patient including labs, vital signs, imaging, progress notes, orders, and available advanced directive documents from current and previous encounters. I then met with patient at bedside.  She is moaning, groaning, and does not open her eyes to meet my gaze.  Nurse as well as PT are at bedside attempting to work with patient but she is just had a loose stool in the bed.  They are working to change her.  Patient unable to participate in goals of care or medical decision making independently at this time.  As per chart review, patient has an  advance directive on file in ACP tab of EPIC that outlines her HCPOA as is her granddaughter [name]. I attempted to reach [name] over the phone [phone]. No answer. VM stated it was an [employer]. No VM left.   I then spoke with patient's daughter [name].  As per chart review, [name] is listed as second/alternative next of kin decision maker.  I spoke with [name] over the phone to discuss diagnosis prognosis, GOC, EOL wishes, disposition and options.  [name] shares that her daughter Podesta is not able to make decisions for patient at this time given her own family responsibilities as well as running her own personal business.  Patient shares that I just it was put as a first person to call as an emergency contact/HCPOA since [name] lives locally.  [name] lives in [place], [place] but is planning to be in [place] today and to stay with her mother through the weekend.  I introduced Palliative Medicine as specialized medical care for people living with serious illness. It focuses on providing relief from the symptoms and stress of a serious illness. The goal is to improve quality of life for both the patient and the family.  [name] shares  she is familiar with palliative medicine team as she has met with them several times during previous hospitalizations.  Brief medical update given.  Reviewed that patient is currently in TEE to rule out vegetation.  She shares she has been getting updates from the team this morning and is anxiously awaiting results of this test.  I attempted to elicit values and goals important to patient and [name].  [name] shares patient has been through a lot.  She understands her mother is tired.  [name] also shares she is surprised that her mother has lived this long.  Space and opportunity provided for [name] to share her thoughts and emotions regarding her mother's current medical situation.  Therapeutic silence, active listening, and emotional support provided.  Education provided on  heart failure with sick sinus syndrome, potential for vegetation and appropriate treatments for agitation, pneumonia, and recovery for patients with multiple comorbidities and advanced age.  We discussed possible plan of care options.  Reviewed continuing to treat the treatable as well as shifting to comfort focused care.  [name] shares she wants to continue to take it day-by-day and see how her mother may or may not improve.  She shares she does not want her mother to suffer but wants to do what she can to make sure she feels as best as possible.  Discussed human mortality.  Reviewed holding hope and reality in the same light.  Discussed watchful waiting and treating the treatable to see how patient's earthly body responds.  [name] was appreciative of our discussion.  I advised her that PMT will continue to follow and support throughout the hospitalization.  I am off service tomorrow but will ask a colleague to follow-up with patient and family to address any acute palliative needs that arise.  Primary Decision Maker HCPOA - #1 Granddaughter [name] unavailable - #2 [name] - available, responsive  Physical Exam Vitals reviewed.  Constitutional:      General: She is not in acute distress.    Appearance: She is normal weight.  HENT:     Head: Normocephalic.  Eyes:     Pupils: Pupils are equal, round, and reactive to light.  Pulmonary:     Effort: Pulmonary effort is normal.  Abdominal:     Palpations: Abdomen is soft.  Musculoskeletal:     Comments: Generalized weakness  Skin:    General: Skin is warm and dry.     Comments: UTA wound  Psychiatric:        Behavior: Behavior normal.     Palliative Assessment/Data: 50%     Thank you for this consult. Palliative medicine will continue to follow and assist holistically.   75 minute visit includes: Detailed review of medical records (labs, imaging, vital signs), medically appropriate exam (mental status, respiratory, cardiac, skin),  discussed with treatment team, counseling and educating patient, family and staff, documenting clinical information, medication management and coordination of care.  Signed by: Lamarr Gunner, DNP, FNP-BC Palliative Medicine   Please contact Palliative Medicine Team providers via [facility] for questions and concerns.

## 2024-06-04 NOTE — Progress Notes (Addendum)
 Transesophageal echocardiogram preliminary report  Leslie Parker [id number] 1933/01/27  Preliminary diagnosis  Bacteremia   Postprocedural diagnosis same  Time out A timeout was performed by the nursing staff and physicians specifically identifying the procedure performed, identification of the patient, the type of sedation, all allergies and medications, all pertinent medical history, and presedation assessment of nasopharynx.  The patient and or family understand the risks of the procedure including the rare risks of death, stroke, heart attack, esophogeal perforation, sore throat, and reaction to medications given.  General anesthesia CRNA administered GA. See MAR. 30 mg propofol . The patient had continued monitoring of heart rate, oxygenation, blood pressure, respiratory rate, and extent of signs of sedation throughout the entire procedure.  The patient received anesthesia over a period of 20 minutes.  CRNA, nursing staff and I were present during the procedure when the patient was anesthetized for 100% of the time.  Treatment considerations No obvious valvular vegetation- final report to follow  For further details of transesophageal echocardiogram please refer to final report.  [name] [facility] MD M[facility] 06/04/2024 1:32 PM

## 2024-06-04 NOTE — Anesthesia Postprocedure Evaluation (Signed)
 Anesthesia Post Note  Patient: Leslie Parker  Procedure(s) Performed: ECHOCARDIOGRAM, TRANSESOPHAGEAL  Patient location during evaluation: Specials Recovery Anesthesia Type: General Level of consciousness: awake and alert Pain management: pain level controlled Vital Signs Assessment: post-procedure vital signs reviewed and stable Respiratory status: spontaneous breathing, nonlabored ventilation, respiratory function stable and patient connected to nasal cannula oxygen Cardiovascular status: blood pressure returned to baseline and stable Postop Assessment: no apparent nausea or vomiting Anesthetic complications: no   No notable events documented.   Last Vitals:  Vitals:   06/04/24 1430 06/04/24 1456  BP: (!) 92/57 (!) 92/40  Pulse: 70 70  Resp: 16 10  Temp:  36.6 C  SpO2: 95% 98%    Last Pain:  Vitals:   06/04/24 1430  TempSrc:   PainSc: Asleep                 Fairy POUR Daveigh Batty

## 2024-06-04 NOTE — Progress Notes (Signed)
 Progress Note   Patient: Leslie Parker FMW:[id number] DOB: 24-May-1933 DOA: 06/01/2024     3 DOS: the patient was seen and examined on 06/04/2024   Brief hospital course: Ms. Chestine Belknap is an 89 year old female with history of atrial fibrillation amiodarone  and apixaban , chronic combined systolic diastolic congestive heart failure with ejection fraction 25 to 30%`, pacemaker status, hypertension, GERD, hypothyroid, CKD stage IV, who presents for chief concerns of cough, congestion, shortness of breath. She did not meet sepsis criteria, chest x-ray showed bilateral lower fields consolidation with a small pleural effusion. Initially treated with Rocephin , vancomycin  and Zithromax .  Blood culture grew Enterococcus Faecium.     Principal Problem:   Bacteremia due to Enterococcus Active Problems:   Community acquired pneumonia   Diabetes mellitus without complication (HCC)   Chronic a-fib (HCC)   Chronic diastolic CHF (congestive heart failure) (HCC)   Essential hypertension   COPD (chronic obstructive pulmonary disease) (HCC)   CKD (chronic kidney disease) stage 4, GFR 15-29 ml/min (HCC)   Hypothyroidism   Pacemaker   Assessment and Plan:  Bacteremia due to Enterococcus Bilateral lower lobe pneumonia. Patient did not meet sepsis criteria at time of admission, sepsis ruled out. Blood culture grew Enterococcus, source is probably from pneumonia. I have personally reviewed patient chest x-ray, patient does have significant bilateral pneumonia, more on the right side. Appreciate ID consult, which has recommended TEE to rule out endocarditis. Continue current antibiotics. TEE will be performed by cardiology today.   Chronic combined systolic and diastolic congestive heart failure. Reviewed echocardiogram performed 01/2024, ejection fraction 25 to 30% with grade 2 diastolic dysfunction. Patient has been having significant elevation of BNP more than 4500 since January 2025, she has  significant short of breath.  Renal function slightly worsened, decreased torsemide  to 20 mg daily.   Chronic a-fib (HCC) Heart rate controlled, continue beta-blocker and Eliquis .   CKD (chronic kidney disease) stage 4, GFR 15-29 ml/min (HCC) Hypokalemia Metabolic acidosis. Hyponatremia. Renal function is still stable, potassium normalized.  Patient had worsening metabolic stenosis, increase dose of sodium bicarb.  Recheck BMP tomorrow.   COPD (chronic obstructive pulmonary disease) (HCC) Not in acute exacerbation   Essential hypertension Continue home medicines.   Anemia of chronic disease. Continue to follow.      Subjective:  Patient feels better today, short of breath has improved.  Has some constipation, last bowel movement 3 days ago  Physical Exam: Vitals:   06/04/24 0426 06/04/24 0451 06/04/24 0615 06/04/24 0731  BP:  (!) 96/50 (!) 99/47 (!) 100/59  Pulse:  64 63 66  Resp:  18 18 16   Temp:  97.6 F (36.4 C)  98.6 F (37 C)  TempSrc:    Oral  SpO2:  96% 97% 97%  Weight: 75.7 kg     Height:       General exam: Appears calm and comfortable  Respiratory system: Decreased breath sounds. Respiratory effort normal. Cardiovascular system: Irregular. No JVD, murmurs, rubs, gallops or clicks. No pedal edema. Gastrointestinal system: Abdomen is nondistended, soft and nontender. No organomegaly or masses felt. Normal bowel sounds heard. Central nervous system: Alert and oriented x2. No focal neurological deficits. Extremities: Symmetric 5 x 5 power. Skin: No rashes, lesions or ulcers Psychiatry: Mood & affect appropriate.    Data Reviewed:  Lab results reviewed.  Family Communication: none  Disposition: Status is: Inpatient Remains inpatient appropriate because: Severity of disease, IV antibiotics.     Time spent: 35 minutes  Author: Murvin  Laurita, MD 06/04/2024 11:58 AM  For on call review [email or web address].

## 2024-06-04 NOTE — Care Management Important Message (Signed)
 Important Message  Patient Details  Name: ESLY SELVAGE MRN: [id number] Date of Birth: 09-10-1932   Important Message Given:  Yes - Medicare IM     Kasean Denherder W, CMA 06/04/2024, 2:46 PM

## 2024-06-04 NOTE — Transfer of Care (Signed)
 Immediate Anesthesia Transfer of Care Note  Patient: Leslie Parker  Procedure(s) Performed: ECHOCARDIOGRAM, TRANSESOPHAGEAL  Patient Location: PACU and Nursing Unit  Anesthesia Type:General  Level of Consciousness: lethargic and responds to stimulation  Airway & Oxygen Therapy: Patient Spontanous Breathing and Patient connected to nasal cannula oxygen  Post-op Assessment: Report given to RN and Post -op Vital signs reviewed and stable  Post vital signs: Reviewed and stable  Last Vitals:  Vitals Value Taken Time  BP 86/42 06/04/24 13:37  Temp    Pulse 69 06/04/24 13:39  Resp 12 06/04/24 13:39  SpO2 98 % 06/04/24 13:39  Vitals shown include unfiled device data.  Last Pain:  Vitals:   06/04/24 1253  TempSrc: Temporal  PainSc:          Complications: No notable events documented.

## 2024-06-05 ENCOUNTER — Other Ambulatory Visit (HOSPITAL_COMMUNITY): Payer: Self-pay

## 2024-06-05 ENCOUNTER — Encounter: Payer: Self-pay | Admitting: Cardiovascular Disease

## 2024-06-05 ENCOUNTER — Telehealth (HOSPITAL_COMMUNITY): Payer: Self-pay

## 2024-06-05 DIAGNOSIS — N184 Chronic kidney disease, stage 4 (severe): Secondary | ICD-10-CM | POA: Diagnosis not present

## 2024-06-05 DIAGNOSIS — Z711 Person with feared health complaint in whom no diagnosis is made: Secondary | ICD-10-CM

## 2024-06-05 DIAGNOSIS — B952 Enterococcus as the cause of diseases classified elsewhere: Secondary | ICD-10-CM | POA: Diagnosis not present

## 2024-06-05 DIAGNOSIS — Z515 Encounter for palliative care: Secondary | ICD-10-CM

## 2024-06-05 DIAGNOSIS — Z7189 Other specified counseling: Secondary | ICD-10-CM

## 2024-06-05 DIAGNOSIS — J189 Pneumonia, unspecified organism: Secondary | ICD-10-CM | POA: Diagnosis not present

## 2024-06-05 DIAGNOSIS — R7881 Bacteremia: Secondary | ICD-10-CM | POA: Diagnosis not present

## 2024-06-05 DIAGNOSIS — R627 Adult failure to thrive: Secondary | ICD-10-CM

## 2024-06-05 DIAGNOSIS — I5032 Chronic diastolic (congestive) heart failure: Secondary | ICD-10-CM

## 2024-06-05 LAB — CULTURE, BLOOD (SINGLE)
Culture  Setup Time: NO GROWTH
Report Status: NO GROWTH — AB
Special Requests: ADEQUATE
Special Requests: ADEQUATE

## 2024-06-05 LAB — CBC
HCT: 33.6 % — ABNORMAL LOW (ref 36.0–46.0)
Hemoglobin: 11 g/dL — ABNORMAL LOW (ref 12.0–15.0)
MCH: 30.7 pg (ref 26.0–34.0)
MCHC: 32.7 g/dL (ref 30.0–36.0)
MCV: 93.9 fL (ref 80.0–100.0)
Platelets: 230 K/uL (ref 150–400)
RBC: 3.58 MIL/uL — ABNORMAL LOW (ref 3.87–5.11)
RDW: 18.7 % — ABNORMAL HIGH (ref 11.5–15.5)
WBC: 16.6 K/uL — ABNORMAL HIGH (ref 4.0–10.5)
nRBC: 0 % (ref 0.0–0.2)

## 2024-06-05 LAB — BASIC METABOLIC PANEL WITH GFR
Anion gap: 15 (ref 5–15)
BUN: 66 mg/dL — ABNORMAL HIGH (ref 8–23)
CO2: 17 mmol/L — ABNORMAL LOW (ref 22–32)
Calcium: 8.4 mg/dL — ABNORMAL LOW (ref 8.9–10.3)
Chloride: 100 mmol/L (ref 98–111)
Creatinine, Ser: 2.76 mg/dL — ABNORMAL HIGH (ref 0.44–1.00)
GFR, Estimated: 16 mL/min — ABNORMAL LOW (ref >60)
Glucose, Bld: 115 mg/dL — ABNORMAL HIGH (ref 70–99)
Potassium: 4.7 mmol/L (ref 3.5–5.1)
Sodium: 132 mmol/L — ABNORMAL LOW (ref 135–145)

## 2024-06-05 MED ORDER — OXYCODONE-ACETAMINOPHEN 5-325 MG PO TABS: 1.0000 | ORAL_TABLET | ORAL | Status: DC | PRN

## 2024-06-05 MED ORDER — HALOPERIDOL LACTATE 2 MG/ML PO CONC: 0.5000 mg | ORAL | Status: DC | PRN

## 2024-06-05 MED ORDER — ONDANSETRON 4 MG PO TBDP: 4.0000 mg | ORAL_TABLET | Freq: Four times a day (QID) | ORAL | Status: DC | PRN

## 2024-06-05 MED ORDER — POLYVINYL ALCOHOL 1.4 % OP SOLN: 1.0000 [drp] | Freq: Four times a day (QID) | OPHTHALMIC | Status: DC | PRN

## 2024-06-05 MED ORDER — GLYCOPYRROLATE 1 MG PO TABS: 1.0000 mg | ORAL_TABLET | ORAL | Status: DC | PRN

## 2024-06-05 MED ORDER — ACETAMINOPHEN 325 MG PO TABS: 650.0000 mg | ORAL_TABLET | Freq: Four times a day (QID) | ORAL | Status: DC | PRN

## 2024-06-05 MED ORDER — GLYCOPYRROLATE 0.2 MG/ML IJ SOLN: 0.2000 mg | INTRAMUSCULAR | Status: DC | PRN

## 2024-06-05 MED ORDER — OXYCODONE-ACETAMINOPHEN 5-325 MG PO TABS
1.0000 | ORAL_TABLET | ORAL | Status: DC | PRN
  Administered 2024-06-05: 2 via ORAL
  Filled 2024-06-05: qty 2

## 2024-06-05 MED ORDER — HALOPERIDOL LACTATE 5 MG/ML IJ SOLN: 0.5000 mg | INTRAMUSCULAR | Status: DC | PRN

## 2024-06-05 MED ORDER — HALOPERIDOL 0.5 MG PO TABS: 0.5000 mg | ORAL_TABLET | ORAL | Status: DC | PRN

## 2024-06-05 MED ORDER — LORAZEPAM 1 MG PO TABS: 1.0000 mg | ORAL_TABLET | ORAL | Status: DC | PRN

## 2024-06-05 MED ORDER — LORAZEPAM 2 MG/ML IJ SOLN: 1.0000 mg | INTRAMUSCULAR | Status: DC | PRN

## 2024-06-05 MED ORDER — LORAZEPAM 2 MG/ML PO CONC: 1.0000 mg | ORAL | Status: DC | PRN

## 2024-06-05 MED ORDER — ACETAMINOPHEN 650 MG RE SUPP
650.0000 mg | Freq: Four times a day (QID) | RECTAL | Status: DC | PRN
Start: 2024-06-05 — End: 2024-06-06

## 2024-06-05 MED ORDER — HYDROMORPHONE HCL 1 MG/ML IJ SOLN
0.5000 mg | INTRAMUSCULAR | Status: DC | PRN
  Administered 2024-06-05 (×3): 1 mg via INTRAVENOUS
  Filled 2024-06-05 (×4): qty 1

## 2024-06-05 MED ORDER — BIOTENE DRY MOUTH MT LIQD: 15.0000 mL | OROMUCOSAL | Status: DC | PRN

## 2024-06-05 MED ORDER — ONDANSETRON HCL 4 MG/2ML IJ SOLN: 4.0000 mg | Freq: Four times a day (QID) | INTRAMUSCULAR | Status: DC | PRN

## 2024-06-09 LAB — CULTURE, BLOOD (ROUTINE X 2)
Culture: NO GROWTH
Culture: NO GROWTH
Special Requests: ADEQUATE
Special Requests: ADEQUATE

## 2024-06-10 ENCOUNTER — Ambulatory Visit: Admitting: Family

## 2024-06-22 NOTE — Progress Notes (Signed)
 Patient more comfortable. Apneic for about one minute at a time. Eyes closed and no more groaning. Not giving dilaudid  at this time because pt is comfortable.

## 2024-06-22 NOTE — Progress Notes (Signed)
 Heart Failure Navigator Progress Note  Current Advanced Heart Failure Team patient of Ellouise Class, FNP and [facility] cardiology patient.  Patient already had a 1 month follow-up scheduled for 06/10/24 @ 9:00.   Navigator will provide patient with a appointment reminder card during this hospitalization.  Charmaine Pines, RN, BSN [facility] Heart Failure Navigator Secure Chat Only

## 2024-06-22 NOTE — Progress Notes (Signed)
 Spoke to palliative in room. Magnet not needed. Taken off for pt comfort.

## 2024-06-22 NOTE — Progress Notes (Signed)
 Progress Note   Patient: Leslie Parker DOB: 04-26-1933 DOA: 06/01/2024     4 DOS: the patient was seen and examined on Jul 01, 2024   Brief hospital course: Leslie Parker is an 89 year old female with history of atrial fibrillation amiodarone  and apixaban , chronic combined systolic diastolic congestive heart failure with ejection fraction 25 to 30%`, pacemaker status, hypertension, GERD, hypothyroid, CKD stage IV, who presents for chief concerns of cough, congestion, shortness of breath. She did not meet sepsis criteria, chest x-ray showed bilateral lower fields consolidation with a small pleural effusion. Initially treated with Rocephin , vancomycin  and Zithromax .  Blood culture grew Enterococcus Faecium.     Principal Problem:   Bacteremia due to Enterococcus Active Problems:   Community acquired pneumonia   Diabetes mellitus without complication (HCC)   Chronic a-fib (HCC)   Chronic diastolic CHF (congestive heart failure) (HCC)   Essential hypertension   COPD (chronic obstructive pulmonary disease) (HCC)   CKD (chronic kidney disease) stage 4, GFR 15-29 ml/min (HCC)   Hypothyroidism   Pacemaker   Assessment and Plan: Bacteremia due to Enterococcus Bilateral lower lobe pneumonia. Patient did not meet sepsis criteria at time of admission, sepsis ruled out. Blood culture grew Enterococcus, source is probably from pneumonia. I have personally reviewed patient chest x-ray, patient does have significant bilateral pneumonia, more on the right side. TEE did not show any evidence of endocarditis. Discussed with ID, patient with continued on vancomycin .  Upper abdominal pain. Likely due to gastritis/gastric ulcer.  Started on Protonix  twice a day.  Condition still not improving, patient is taking pain medicine.  Will increase dose of Percocet.   Chronic combined systolic and diastolic congestive heart failure. Reviewed echocardiogram performed 01/2024, ejection fraction  25 to 30% with grade 2 diastolic dysfunction. Patient has been having significant elevation of BNP more than 4500 since January 2025, she has significant short of breath.  Patient could not tolerate any treatment, I discontinued torsemide  due to worsening renal function.   Chronic a-fib (HCC) Heart rate controlled, continue beta-blocker and Eliquis .   CKD (chronic kidney disease) stage 4, GFR 15-29 ml/min (HCC) Hypokalemia Metabolic acidosis. Hyponatremia. Renal function seems to be getting worse, metabolic acidosis also getting worse.  Sodium bicarb oral started.   COPD (chronic obstructive pulmonary disease) (HCC) Not in acute exacerbation   Essential hypertension Continue home medicines.   Anemia of chronic disease. Continue to follow.  Failure to thrive. Poor prognosis. Patient condition does not seem to be improving, patient is getting weaker, become more confused.  Given her advanced renal disease, terminal heart disease, patient prognosis is very poor.  There is little chance patient will improve, palliative care will meet patient daughter today to discuss about comfort care.     Subjective:  Has been confused, still complaining of upper stomach pain.  Does not feel short of breath.  Physical Exam: Vitals:   01-Jul-2024 0108 Jul 01, 2024 0500 07/01/2024 0551 Jul 01, 2024 0959  BP: (!) 110/55  (!) 108/53 (!) 112/45  Pulse: 70  80 67  Resp: 19  19 16   Temp: 98.5 F (36.9 C)  (!) 97.5 F (36.4 C)   TempSrc:      SpO2: 100%  97% 98%  Weight:  75.5 kg    Height:       General exam: Frail and ill-appearing. Respiratory system: Decreased breath sounds. Respiratory effort normal. Cardiovascular system: S1 & S2 heard, RRR. No JVD, murmurs, rubs, gallops or clicks. No pedal edema. Gastrointestinal system: Abdomen is  nondistended, soft and gastric tender. No organomegaly or masses felt. Normal bowel sounds heard. Central nervous system: Alert and oriented x2. No focal neurological  deficits. Extremities: Symmetric 5 x 5 power. Skin: No rashes, lesions or ulcers Psychiatry: Flat affect   Data Reviewed:  Lab results reviewed.  Family Communication: None  Disposition: Status is: Inpatient Remains inpatient appropriate because: Severity of disease, IV treatment.     Time spent: 55 minutes  Author: Murvin Mana, MD 06/17/2024 11:51 AM  For on call review [email or web address].

## 2024-06-22 NOTE — Telephone Encounter (Signed)
 Pharmacy Patient Advocate Encounter  Insurance verification completed.    The patient is insured through [employer]. Patient has Medicare and is not eligible for a copay card, but may be able to apply for patient assistance or Medicare RX Payment Plan (Patient Must reach out to their plan, if eligible for payment plan), if available.    Ran test claim for Eliquis  2.5mg  and the current 30 day co-pay is $47.   This test claim was processed through [facility]- copay amounts may vary at other pharmacies due to [employer], or as the patient moves through the different stages of their insurance plan.

## 2024-06-22 NOTE — Progress Notes (Signed)
 [facility] CLINIC CARDIOLOGY PROGRESS NOTE       Patient ID: Leslie Parker MRN: [id number] DOB/AGE: 01-09-1933 89 y.o.  Admit date: 06/01/2024 Referring Physician Dr. Murvin Mana Primary Physician Sherial Bail, MD  Primary Cardiologist Dr. Florencio Reason for Consultation bacteremia, need for TEE  HPI: Leslie Parker is a 89 y.o. female  with a past medical history of chronic HFrEF (EF 25-30% - 07/2023), paroxymal atrial fibrillation (on Eliquis , PO amio), SSS s/p [employer] dual chamber PPM (11/15/2022), hypertension,  type 2 diabetes mellitus, severe COPD stage 3 by GOLD classification, CKG stage 4 who presented to the ED on 06/01/2024 for redness.  Blood cultures positive for Enterococcus.  Cardiology was consulted for need for TEE.   Interval history: -Patient seen and examined this AM, laying in bed complaining of nausea. -Renal function slightly worse today.  BP stable off of BP meds. -She is less oriented today, more confused.  Continues to progressively decline.  Review of systems complete and found to be negative unless listed above    Past Medical History:  Diagnosis Date   Acute kidney injury superimposed on CKD 11/24/2022   Asthma    Atrial fibrillation with RVR (HCC)    Cancer (HCC)    Basal Cell   CHF (congestive heart failure) (HCC)    Closed left hip fracture (HCC) 01/25/2017   Closed right hip fracture (HCC) 02/13/2023   Diabetes mellitus without complication (HCC)    Femur fracture, left (HCC) 02/03/2015   Heart murmur    Hypertension    Impetigo    Osteoarthritis    Osteopenia    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   Rapid atrial fibrillation, new onset(HCC) 06/05/2022   Vomiting    can not due to surgery   Wears dentures    full upper and lower    Past Surgical History:  Procedure Laterality Date   ABDOMINAL HYSTERECTOMY     BACK SURGERY     BLADDER SURGERY     mesh   CARPAL TUNNEL RELEASE Bilateral    CATARACT EXTRACTION Right  2017   CATARACT EXTRACTION W/PHACO Left 02/06/2016   Procedure: CATARACT EXTRACTION PHACO AND INTRAOCULAR LENS PLACEMENT (IOC) left eye;  Surgeon: Donzell Arlyce Budd, MD;  Location: [facility];  Service: Ophthalmology;  Laterality: Left;  DIABETIC LEFT Cannot arrive before 9:30   CERVICAL DISC SURGERY     CHOLECYSTECTOMY     EYE SURGERY Bilateral    Cataract Extraction with IOL   FEMUR IM NAIL Left 02/04/2015   Procedure: INTRAMEDULLARY (IM) NAIL FEMORAL;  Surgeon: Franky Cranker, MD;  Location: [facility];  Service: Orthopedics;  Laterality: Left;   HARDWARE REMOVAL Left 01/17/2017   Procedure: HARDWARE REMOVAL;  Surgeon: Cranker Franky, MD;  Location: [facility];  Service: Orthopedics;  Laterality: Left;   HERNIA REPAIR  2014   esophageal and gastric mesh. patient unable to throw up d/t mesh   HIP ARTHROPLASTY Left 01/26/2017   Procedure: ARTHROPLASTY BIPOLAR HIP (HEMIARTHROPLASTY) removal hardware left hip;  Surgeon: Cleotilde Barrio, MD;  Location: [facility];  Service: Orthopedics;  Laterality: Left;   INTRAMEDULLARY (IM) NAIL INTERTROCHANTERIC Right 02/13/2023   Procedure: INTRAMEDULLARY (IM) NAIL INTERTROCHANTERIC;  Surgeon: Edie Norleen PARAS, MD;  Location: [facility];  Service: Orthopedics;  Laterality: Right;   JOINT REPLACEMENT Bilateral    knees   PACEMAKER IMPLANT N/A 11/15/2022   Procedure: PACEMAKER IMPLANT;  Surgeon: Ammon Blunt, MD;  Location: [facility];  Service: Cardiovascular;  Laterality: N/A;  PACEMAKER IMPLANT N/A 11/28/2022   Procedure: PACEMAKER IMPLANT;  Surgeon: Ammon Blunt, MD;  Location: [facility];  Service: Cardiovascular;  Laterality: N/A;  Lead reposition   REPLACEMENT TOTAL KNEE BILATERAL Bilateral [id number]   SHOULDER ARTHROSCOPY WITH OPEN ROTATOR CUFF REPAIR AND DISTAL CLAVICLE ACROMINECTOMY Left 10/25/2016   Procedure: SHOULDER ARTHROSCOPY WITH OPEN ROTATOR CUFF REPAIR AND DISTAL CLAVICLE ACROMINECTOMY;  Surgeon: Franky Cranker, MD;  Location: [facility];  Service: Orthopedics;  Laterality: Left;   TEE WITHOUT CARDIOVERSION N/A 06/04/2024   Procedure: ECHOCARDIOGRAM, TRANSESOPHAGEAL;  Surgeon: Hilarie Rocher, MD;  Location: [facility];  Service: Cardiovascular;  Laterality: N/A;   THYROID  SURGERY     goiter removed   TOTAL HIP REVISION Left 12/02/2017   Procedure: TOTAL HIP REVISION;  Surgeon: Leora Lynwood SAUNDERS, MD;  Location: [facility];  Service: Orthopedics;  Laterality: Left;   TOTAL SHOULDER REPLACEMENT Right 2012    Medications Prior to Admission  Medication Sig Dispense Refill Last Dose/Taking   acetaminophen  (TYLENOL ) 325 MG tablet Take 2 tablets (650 mg total) by mouth every 8 (eight) hours as needed for fever, headache or mild pain (pain score 1-3).   06/01/2024 Morning   albuterol  (VENTOLIN  HFA) 108 (90 Base) MCG/ACT inhaler Inhale 2 puffs into the lungs every 6 (six) hours as needed.   Taking As Needed   alum & mag hydroxide-simeth (MAALOX/MYLANTA) 200-200-20 MG/5ML suspension Take 30 mLs by mouth every 6 (six) hours as needed for indigestion or heartburn.   Taking As Needed   amiodarone  (PACERONE ) 200 MG tablet Take 1 tablet by mouth daily.   05/31/2024   apixaban  (ELIQUIS ) 2.5 MG TABS tablet Take 1 tablet (2.5 mg total) by mouth 2 (two) times daily. 60 tablet 1 05/31/2024   ascorbic acid  (VITAMIN C ) 500 MG tablet Take 500 mg by mouth daily.   05/31/2024   carvedilol  (COREG ) 3.125 MG tablet Take 1 tablet (3.125 mg total) by mouth 2 (two) times daily with a meal. Reduced from 6.25 mg.   05/31/2024   Cholecalciferol  (VITAMIN D -1000 MAX ST) 25 MCG (1000 UT) tablet Take 1,000 Units by mouth daily.   05/31/2024   guaiFENesin -dextromethorphan  (ROBITUSSIN DM) 100-10 MG/5ML syrup Take 10 mLs by mouth every 6 (six) hours as needed for cough.   Taking As Needed   hydrALAZINE  (APRESOLINE ) 25 MG tablet 25 mg 3 (three) times daily. 0800/1400/2000   05/31/2024   levothyroxine  (SYNTHROID ) 25 MCG tablet Take 2 tablets (50 mcg  total) by mouth daily at 6 (six) AM. (Patient taking differently: Take 75 mcg by mouth daily at 6 (six) AM.)   06/01/2024 Morning   Multiple Vitamin (MULTIVITAMIN WITH MINERALS) TABS tablet Take 1 tablet by mouth daily. One-A-Day Women's Vitamin   05/31/2024   ondansetron  (ZOFRAN ) 4 MG tablet Take 1 tablet (4 mg total) by mouth every 6 (six) hours as needed for nausea. 20 tablet 0 06/01/2024 Morning   pantoprazole  (PROTONIX ) 40 MG tablet Take 1 tablet (40 mg total) by mouth 2 (two) times daily. 60 tablet 0 05/31/2024   polyethylene glycol (MIRALAX  / GLYCOLAX ) 17 g packet Take 17 g by mouth daily as needed.   Taking As Needed   senna-docusate (SENOKOT-S) 8.6-50 MG tablet Take 2 tablets by mouth at bedtime as needed for mild constipation.   Taking As Needed   sucralfate (CARAFATE) 1 g tablet Take 1 g by mouth 3 (three) times daily.   05/31/2024   torsemide  (DEMADEX ) 20 MG tablet Take 40 mg by mouth daily  as needed.   05/27/2024   TRELEGY ELLIPTA 100-62.5-25 MCG/ACT AEPB Inhale 1 puff into the lungs daily.   05/31/2024   melatonin 5 MG TABS Take 1 tablet (5 mg total) by mouth at bedtime as needed. (Patient not taking: Reported on 06/01/2024)   Not Taking   Social History   Socioeconomic History   Marital status: Divorced    Spouse name: Not on file   Number of children: 4   Years of education: Not on file   Highest education level: 12th grade  Occupational History   Occupation: Retitred/Disability  Tobacco Use   Smoking status: Never   Smokeless tobacco: Never  Vaping Use   Vaping status: Never Used  Substance and Sexual Activity   Alcohol use: No    Alcohol/week: 0.0 standard drinks of alcohol   Drug use: No   Sexual activity: Not Currently  Other Topics Concern   Not on file  Social History Narrative   Not on file   Social Drivers of Health   Financial Resource Strain: Low Risk  (01/13/2024)   Received from [facility]   Overall Financial Resource Strain (CARDIA)     Difficulty of Paying Living Expenses: Not hard at all  Food Insecurity: No Food Insecurity (06/01/2024)   Hunger Vital Sign    Worried About Running Out of Food in the Last Year: Never true    Ran Out of Food in the Last Year: Never true  Transportation Needs: No Transportation Needs (06/01/2024)   PRAPARE - Administrator, Civil Service (Medical): No    Lack of Transportation (Non-Medical): No  Physical Activity: Not on file  Stress: Not on file  Social Connections: Moderately Isolated (06/01/2024)   Social Connection and Isolation Panel    Frequency of Communication with Friends and Family: More than three times a week    Frequency of Social Gatherings with Friends and Family: Three times a week    Attends Religious Services: 1 to 4 times per year    Active Member of Clubs or Organizations: No    Attends Banker Meetings: Never    Marital Status: Divorced  Catering Manager Violence: Not At Risk (06/01/2024)   Humiliation, Afraid, Rape, and Kick questionnaire    Fear of Current or Ex-Partner: No    Emotionally Abused: No    Physically Abused: No    Sexually Abused: No    Family History  Problem Relation Age of Onset   Heart failure Mother    Hypertension Mother    Emphysema Father      Vitals:   07-03-2024 0108 2024/07/03 0500 07-03-2024 0551 03-Jul-2024 0959  BP: (!) 110/55  (!) 108/53 (!) 112/45  Pulse: 70  80 67  Resp: 19  19 16   Temp: 98.5 F (36.9 C)  (!) 97.5 F (36.4 C)   TempSrc:      SpO2: 100%  97% 98%  Weight:  75.5 kg    Height:        PHYSICAL EXAM General: Chronically ill-appearing elderly female, well nourished, in no acute distress. HEENT: Normocephalic and atraumatic. Neck: No JVD.  Lungs: Normal respiratory effort on room air. Clear bilaterally to auscultation. No wheezes, crackles, rhonchi.  Heart: HRRR. Normal S1 and S2 without gallops or murmurs.  Abdomen: Non-distended appearing.  Msk: Normal strength and tone for  age. Extremities: Warm and well perfused. No clubbing, cyanosis.  No edema.  Neuro: Alert and oriented X 3. Psych: Answers questions  appropriately.   Labs: Basic Metabolic Panel: Recent Labs    06/04/24 0524 07/05/24 0543  NA 133* 132*  K 4.5 4.7  CL 103 100  CO2 17* 17*  GLUCOSE 138* 115*  BUN 61* 66*  CREATININE 2.31* 2.76*  CALCIUM 8.2* 8.4*   Liver Function Tests: No results for input(s): AST, ALT, ALKPHOS, BILITOT, PROT, ALBUMIN  in the last 72 hours.  No results for input(s): LIPASE, AMYLASE in the last 72 hours. CBC: Recent Labs    06/04/24 0524 2024-07-05 0543  WBC 15.7* 16.6*  HGB 11.0* 11.0*  HCT 34.0* 33.6*  MCV 93.9 93.9  PLT 263 230   Cardiac Enzymes: No results for input(s): CKTOTAL, CKMB, CKMBINDEX, TROPONINIHS in the last 72 hours.  BNP: No results for input(s): BNP in the last 72 hours.  D-Dimer: No results for input(s): DDIMER in the last 72 hours. Hemoglobin A1C: No results for input(s): HGBA1C in the last 72 hours. Fasting Lipid Panel: No results for input(s): CHOL, HDL, LDLCALC, TRIG, CHOLHDL, LDLDIRECT in the last 72 hours. Thyroid  Function Tests: No results for input(s): TSH, T4TOTAL, T3FREE, THYROIDAB in the last 72 hours.  Invalid input(s): FREET3 Anemia Panel: No results for input(s): VITAMINB12, FOLATE, FERRITIN, TIBC, IRON, RETICCTPCT in the last 72 hours.   Radiology: ECHO TEE Result Date: 06/04/2024    TRANSESOPHOGEAL ECHO REPORT   Patient Name:   Leslie Parker Date of Exam: 06/04/2024 Medical Rec #:  [id number]     Height:       69.0 in Accession #:    [id number]    Weight:       166.9 lb Date of Birth:  1933-06-30      BSA:          1.913 m Patient Age:    89 years      BP:           84/46 mmHg Patient Gender: F             HR:           73 bpm. Exam Location:  [facility] Procedure: Transesophageal Echo, Cardiac Doppler, Color Doppler and 3D Echo            (Both Spectral and  Color Flow Doppler were utilized during            procedure). Indications:     Bacteremia  History:         Patient has prior history of Echocardiogram examinations, most                  recent 06/03/2024. CHF, Pacemaker, COPD; Risk                  Factors:Hypertension. CKD.  Sonographer:     Philomena Daring Referring Phys:  [id number] Milicent Acheampong Diagnosing Phys: Dearl Leaven PROCEDURE: After discussion of the risks and benefits of a TEE, an informed consent was obtained from a family member. The transesophogeal probe was passed without difficulty through the esophogus of the patient. Imaged were obtained with the patient in a left lateral decubitus position. Sedation performed by different physician. The patient was monitored while under deep sedation. Anesthestetic sedation was provided intravenously by Anesthesiology: 70mg  of Propofol . Image quality was good. The patient developed no complications during the procedure.  IMPRESSIONS 1. Sclerotic aortic valve. Moderately thickened leaflets with mildly reduced excursion. Mobile linear echodensity seen at site of leaflet coaptation. Location, apperance, and lack of associated valvular regurgitation favor Lambl's excrescence (  normal finding), though valvular vegetation is possible. 2. No vegetations seen on device leads. No other significant valvular abnormalities. 3. Severely reduced LV systolic function. 4. Plaque noted in the descending aorta. FINDINGS  Left Ventricle: Left ventricular ejection fraction, by estimation, is 25 to 30%. The left ventricle has severely decreased function. The left ventricular internal cavity size was normal in size. Right Ventricle: Lead present in RV. The right ventricular size is normal. No increase in right ventricular wall thickness. Right ventricular systolic function is mildly reduced. Left Atrium: Left atrial size was normal in size. No left atrial/left atrial appendage thrombus was detected. Right Atrium: Right atrial size  was normal in size. Lead present in RA. Pericardium: Trivial pericardial effusion is present. Mitral Valve: Posterior leaflet is thickened and restricted. Calcification of chordal/subchordal apparatus. The mitral valve is abnormal. Trivial mitral valve regurgitation. Tricuspid Valve: The tricuspid valve is normal in structure. Tricuspid valve regurgitation is trivial. Aortic Valve: Linear mobile echodensities at site of valve coaptation are most consistent with Lambl's excrescences. Vegetation is also in the differential. The aortic valve is calcified. There is moderate thickening of the aortic valve. Aortic valve regurgitation is trivial. Pulmonic Valve: The pulmonic valve was normal in structure. Pulmonic valve regurgitation is trivial. Aorta: The aortic root is normal in size and structure. There is severe (Grade IV) plaque. Venous: The left upper pulmonary vein and left lower pulmonary vein are normal. IAS/Shunts: No atrial level shunt detected by color flow Doppler. Additional Comments: Spectral Doppler performed. Akshay Pendyal Electronically signed by Dearl Leaven Signature Date/Time: 06/04/2024/4:39:04 PM    Final    ECHOCARDIOGRAM COMPLETE Result Date: 06/03/2024    ECHOCARDIOGRAM REPORT   Patient Name:   Leslie Parker Date of Exam: 06/03/2024 Medical Rec #:  [id number]     Height:       69.0 in Accession #:    [id number]    Weight:       168.9 lb Date of Birth:  1932/08/23      BSA:          1.923 m Patient Age:    89 years      BP:           136/58 mmHg Patient Gender: F             HR:           64 bpm. Exam Location:  [facility] Procedure: 2D Echo, Cardiac Doppler and Color Doppler (Both Spectral and Color            Flow Doppler were utilized during procedure). Indications:     Bacteremia R78.81  History:         Patient has prior history of Echocardiogram examinations, most                  recent 01/21/2024. CHF; Risk Factors:Hypertension.  Sonographer:     Christopher Furnace Referring Phys:  [id number] AMY N COX  Diagnosing Phys: [name] Lunger MD IMPRESSIONS  1. Left ventricular ejection fraction, by estimation, is 25 to 30%. Left ventricular ejection fraction by PLAX is 26 %. The left ventricle has severely decreased function. The left ventricle demonstrates global hypokinesis. The left ventricular internal  cavity size was moderately dilated. There is mild left ventricular hypertrophy. Left ventricular diastolic parameters are consistent with Grade I diastolic dysfunction (impaired relaxation).  2. Right ventricular systolic function is mildly reduced. The right ventricular size is normal.  3. Moderate pleural effusion in the left lateral  region.  4. The mitral valve is normal in structure. Mild mitral valve regurgitation. No evidence of mitral stenosis.  5. The aortic valve is calcified. Aortic valve regurgitation is not visualized. Aortic valve sclerosis/calcification is present, without any evidence of aortic stenosis. Aortic valve mean gradient measures 5.0 mmHg.  6. The inferior vena cava is normal in size with greater than 50% respiratory variability, suggesting right atrial pressure of 3 mmHg. FINDINGS  Left Ventricle: Left ventricular ejection fraction, by estimation, is 25 to 30%. Left ventricular ejection fraction by PLAX is 26 %. The left ventricle has severely decreased function. The left ventricle demonstrates global hypokinesis. Strain was performed and the global longitudinal strain is indeterminate. The left ventricular internal cavity size was moderately dilated. There is mild left ventricular hypertrophy. Left ventricular diastolic parameters are consistent with Grade I diastolic dysfunction (impaired relaxation). Right Ventricle: The right ventricular size is normal. No increase in right ventricular wall thickness. Right ventricular systolic function is mildly reduced. Left Atrium: Left atrial size was normal in size. Right Atrium: Right atrial size was normal in size. Pericardium: There is no evidence  of pericardial effusion. Mitral Valve: The mitral valve is normal in structure. Mild mitral annular calcification. Mild mitral valve regurgitation. No evidence of mitral valve stenosis. MV peak gradient, 4.8 mmHg. The mean mitral valve gradient is 2.0 mmHg. Tricuspid Valve: The tricuspid valve is normal in structure. Tricuspid valve regurgitation is mild . No evidence of tricuspid stenosis. Aortic Valve: The aortic valve is calcified. Aortic valve regurgitation is not visualized. Aortic valve sclerosis/calcification is present, without any evidence of aortic stenosis. Aortic valve mean gradient measures 5.0 mmHg. Aortic valve peak gradient measures 9.9 mmHg. Aortic valve area, by VTI measures 1.25 cm. Pulmonic Valve: The pulmonic valve was normal in structure. Pulmonic valve regurgitation is mild. No evidence of pulmonic stenosis. Aorta: The aortic root is normal in size and structure. Venous: The inferior vena cava is normal in size with greater than 50% respiratory variability, suggesting right atrial pressure of 3 mmHg. IAS/Shunts: No atrial level shunt detected by color flow Doppler. Additional Comments: 3D was performed not requiring image post processing on an independent workstation and was indeterminate. There is a moderate pleural effusion in the left lateral region.  LEFT VENTRICLE PLAX 2D LV EF:         Left            Diastology                ventricular     LV e' medial:    3.96 cm/s                ejection        LV E/e' medial:  14.9                fraction by     LV e' lateral:   4.54 cm/s                PLAX is 26      LV E/e' lateral: 13.0                %. LVIDd:         5.18 cm LVIDs:         4.55 cm LV PW:         1.47 cm LV IVS:        1.57 cm LVOT diam:     2.00 cm LV SV:  39 LV SV Index:   20 LVOT Area:     3.14 cm LV IVRT:       72 msec  LV Volumes (MOD) LV vol d, MOD    94.1 ml A2C: LV vol d, MOD    132.0 ml A4C: LV vol s, MOD    54.1 ml A2C: LV vol s, MOD    89.5 ml A4C: LV SV  MOD A2C:   40.0 ml LV SV MOD A4C:   132.0 ml LV SV MOD BP:    43.1 ml RIGHT VENTRICLE RV Basal diam:  4.50 cm RV Mid diam:    4.08 cm LEFT ATRIUM             Index        RIGHT ATRIUM           Index LA diam:        4.40 cm 2.29 cm/m   RA Area:     14.20 cm LA Vol (A2C):   77.1 ml 40.10 ml/m  RA Volume:   36.00 ml  18.72 ml/m LA Vol (A4C):   59.8 ml 31.10 ml/m LA Biplane Vol: 71.1 ml 36.98 ml/m  AORTIC VALVE AV Area (Vmax):    1.24 cm AV Area (Vmean):   1.22 cm AV Area (VTI):     1.25 cm AV Vmax:           157.00 cm/s AV Vmean:          104.133 cm/s AV VTI:            0.310 m AV Peak Grad:      9.9 mmHg AV Mean Grad:      5.0 mmHg LVOT Vmax:         62.10 cm/s LVOT Vmean:        40.400 cm/s LVOT VTI:          0.123 m LVOT/AV VTI ratio: 0.40  AORTA Ao Root diam: 3.30 cm MITRAL VALVE                TRICUSPID VALVE MV Area (PHT): 5.46 cm     TR Peak grad:   21.0 mmHg MV Area VTI:   1.58 cm     TR Vmax:        229.00 cm/s MV Peak grad:  4.8 mmHg MV Mean grad:  2.0 mmHg     SHUNTS MV Vmax:       1.09 m/s     Systemic VTI:  0.12 m MV Vmean:      66.0 cm/s    Systemic Diam: 2.00 cm MV Decel Time: 139 msec MV E velocity: 59.20 cm/s MV A velocity: 111.00 cm/s MV E/A ratio:  0.53 [name] Lunger MD Electronically signed by [name] Lunger MD Signature Date/Time: 06/03/2024/5:17:44 PM    Final    DG Abd 1 View Result Date: 06/02/2024 EXAM: 1 VIEW XRAY OF THE ABDOMEN 06/02/2024 11:44:00 AM COMPARISON: 02/28/2024 CLINICAL HISTORY: Abdominal pain [id number]; [id number] Constipation [id number] FINDINGS: BOWEL: Moderately dilated loop of small bowel in the left upper quadrant, concerning for ileus or possibly small bowel obstruction. Mild amount of stool is noted throughout the nondilated colon. SOFT TISSUES: No opaque urinary calculi. BONES: No acute osseous abnormality. IMPRESSION: 1. Moderately dilated loop of small bowel in the left upper quadrant, worrisome for ileus versus small-bowel obstruction. Electronically signed by:  Lynwood Seip MD 06/02/2024 11:54 AM EST RP Workstation: [id number]   DG Chest Portable 1 View Result Date:  06/01/2024 CLINICAL DATA:  Cough and shortness of breath. EXAM: PORTABLE CHEST 1 VIEW COMPARISON:  04/13/2024 FINDINGS: The cardio pericardial silhouette is enlarged. Bibasilar collapse/consolidation with small to moderate bilateral pleural effusions, progressive in the interval. Left permanent pacemaker again noted. Telemetry leads overlie the chest. IMPRESSION: Bibasilar collapse/consolidation with small to moderate bilateral pleural effusions, progressive in the interval. Electronically Signed   By: Camellia Candle M.D.   On: 06/01/2024 06:24    ECHO as above  TELEMETRY (personally reviewed): Ventricular pacing rate 60s  EKG (personally reviewed): Ventricular pacing rate 75 bpm  Data reviewed by me 02-Jul-2024: last 24h vitals tele labs imaging I/O ED provider note, admission H&P, hospitalist progress note, ID notes  Principal Problem:   Bacteremia due to Enterococcus Active Problems:   Diabetes mellitus without complication (HCC)   Chronic a-fib (HCC)   Essential hypertension   COPD (chronic obstructive pulmonary disease) (HCC)   Chronic diastolic CHF (congestive heart failure) (HCC)   CKD (chronic kidney disease) stage 4, GFR 15-29 ml/min (HCC)   Hypothyroidism   Community acquired pneumonia   Pacemaker    ASSESSMENT AND PLAN:  Leslie Parker is a 89 y.o. female  with a past medical history of chronic HFrEF (EF 25-30% - 07/2023), paroxymal atrial fibrillation (on Eliquis , PO amio), SSS s/p [employer] dual chamber PPM (11/15/2022), hypertension,  type 2 diabetes mellitus, severe COPD stage 3 by GOLD classification, CKG stage 4 who presented to the ED on 06/01/2024 for redness.  Blood cultures positive for Enterococcus.  Cardiology was consulted for need for TEE.   # Enterococcus faecium bacteremia # Community-acquired pneumonia # Chronic HFrEF (25-30%) # Paroxymal atrial  fibrillation # SSS s/p dual chamber PPM (10/2022) # Chronic LBBB Patient with significant past cardiac history presented with weakness.  Found to have pneumonia and Enterococcus bacteremia on blood cultures.  Endorses some shortness of breath and BNP is elevated but this is commonly elevated and she has history of poorly controlled heart failure.  Otherwise has no cardiac complaints.  Pacemaker functioning appropriately on telemetry.  ID has requested TEE to rule out endocarditis.  TEE yesterday without clear evidence of endocarditis, did note mobile linear echodensity at leaflet coaptation which favors Lambl's excrescence. -Carvedilol  and hydralazine  discontinued yesterday due to hypotension.  Torsemide  discontinued due to worsening renal function. -Continue Eliquis  2.5 mg twice daily, amiodarone  200 mg daily. -Discussed patient's declining status with hospitalist.  Palliative medicine is following patient.  Prognosis is guarded.  This patient's plan of care was discussed and created with Dr. Ammon and he is in agreement.  Signed: Danita Bloch, PA-C  07/02/2024, 10:26 AM [facility]

## 2024-06-22 NOTE — Plan of Care (Signed)
   Problem: Education: Goal: Knowledge of General Education information will improve Description: Including pain rating scale, medication(s)/side effects and non-pharmacologic comfort measures Outcome: Not Progressing   Problem: Health Behavior/Discharge Planning: Goal: Ability to manage health-related needs will improve Outcome: Not Progressing   Problem: Clinical Measurements: Goal: Ability to maintain clinical measurements within normal limits will improve Outcome: Not Progressing Goal: Will remain free from infection Outcome: Not Progressing Goal: Diagnostic test results will improve Outcome: Not Progressing Goal: Respiratory complications will improve Outcome: Not Progressing Goal: Cardiovascular complication will be avoided Outcome: Not Progressing   Problem: Activity: Goal: Risk for activity intolerance will decrease Outcome: Not Progressing   Problem: Nutrition: Goal: Adequate nutrition will be maintained Outcome: Not Progressing   Problem: Coping: Goal: Level of anxiety will decrease Outcome: Not Progressing   Problem: Elimination: Goal: Will not experience complications related to bowel motility Outcome: Not Progressing Goal: Will not experience complications related to urinary retention Outcome: Not Progressing   Problem: Pain Managment: Goal: General experience of comfort will improve and/or be controlled Outcome: Not Progressing   Problem: Safety: Goal: Ability to remain free from injury will improve Outcome: Not Progressing   Problem: Skin Integrity: Goal: Risk for impaired skin integrity will decrease Outcome: Not Progressing   Problem: Activity: Goal: Ability to tolerate increased activity will improve Outcome: Not Progressing   Problem: Clinical Measurements: Goal: Ability to maintain a body temperature in the normal range will improve Outcome: Not Progressing   Problem: Respiratory: Goal: Ability to maintain adequate ventilation will  improve Outcome: Not Progressing Goal: Ability to maintain a clear airway will improve Outcome: Not Progressing

## 2024-06-22 NOTE — Progress Notes (Signed)
 Daily Progress Note   Patient Name: Leslie Parker       Date: 2024/06/17 DOB: 09/08/1932  Age: 89 y.o. MRN#: [id number] Attending Physician: Laurita Pillion, MD Primary Care Physician: Sherial Bail, MD Admit Date: 06/01/2024  Reason for Consultation/Follow-up: Establishing goals of care  HPI/Brief Hospital Review: 89 y.o. female  with past medical history of HFrEF with EF of 25 to 30%, SSS s/p PPM, A-fib (Eliquis ), HTN, GERD, hypothyroidism, HLD, 2 diabetes, COPD, CKD (stage IV) admitted on 06/01/2024 with cough, congestion, shortness of breath for 1 week.   Of note, this is patient's sixth inpatient admission in the last 6 months.   Upon arrival to ED, patient found to be hypertensive (173/81), tachypneic (RR 24), and oxygen saturation of 93% on room air.  Patient was given antibiotics, cultures were sent.  Viral panel negative.  Radiograph of chest revealed bilateral lower field consolidation with a small pleural effusion.  Blood cultures grew Enterococcus faecium.   Patient is being treated for bilateral lower lobe pneumonia as well as bacteremia due to Enterococcus.   PMT was consulted to support patient and family with goals of care discussions.   Of note, patient is familiar to PMT as palliative team has been consulted to support patient during previous hospitalizations.  Subjective: Extensive chart review has been completed prior to meeting patient including labs, vital signs, imaging, progress notes, orders, and available advanced directive documents from current and previous encounters.    Visited with [name] at her bedside. Upon entering room, [name] moaning out, unable to verbalize her complaint.  She is disheveled and lying flat in bed.  Raised head of bed and attempted  to have her communicate her needs but she remained unable.  Cell phone in her hand as she is attempting to make a phone call but she is unable to tell me who she is trying to get in touch with.  Secure messages sent to nursing staff as well as primary team regarding pain management.  Cardiology team added to secure chat by primary team.  Concerns discussed related to [name] overall poor prognosis with limited options available to continue aggressive treatment.  Recommended to primary team to increase dosing of oxycodone  and/or addition of breakthrough medication.  Called and spoke with [name] daughter-[name].  Provided medical updates  and state in which [name] is this morning.  We discussed visible and apparent discomfort and distress.  Ensured [name] team will be addressing ongoing pain management needs.  We discussed [name] overall poor prognosis as discussed with primary team as well as cardiology.  [name] shares her understanding of [name] overall poor prognosis and continued decline during this hospitalization.  [name] shares previous providers have briefly discussed comfort care with her for [name].  Provided [name] with a brief overall understanding of comfort care.  [name] shares that she will be at bedside later this afternoon to continue discussions.  Nursing staff made aware to inform me once [name] arrives at bedside.  Returned to bedside to meet with [name]-daughter and Crystal-granddaughter. [name] voices concern related to Ms. Snee discomfort and pain--shares Ms. Mapp was able to explain she was having back pain. Spoke with [name] and [employer] outside of the room. [name] inquired about comfort/hospice services.  We discussed in detail comfort care, Ms. Decamp would no longer receive aggressive medical interventions such as continuous vital signs, lab work, radiology testing, or medications not focused on comfort. All care would focus on how the patient is looking and feeling. This would include  management of any symptoms that may cause discomfort, pain, shortness of breath, cough, nausea, agitation, anxiety, and/or secretions etc. Symptoms would be managed with medications and other non-pharmacological interventions such as spiritual support if requested, repositioning, music therapy, or therapeutic listening. Family verbalized understanding and appreciation. [name] is clear in that she wishes for [name] to transition to full comfort measures. We discussed utilizing IV pain medication such as hydromorphone  to achieve comfort as her pain has not been relieved with oral oxycodone  for which [name] was in agreement.  [name] inquires about disposition and the hospice IPU. Recommended evaluation in the next 24 hours to determine most appropriate disposition, hospital death versus hospice IPU.  Orders placed to reflect CMO--requested nursing staff to administer dose of IV hydromorphone  due to [name] ongoing moaning and visible discomfort. Made nursing and team aware of transition, will follow closely and will transition to continuous IV hydromorphone  infusion if needed.  Answered and addressed all questions and concerns. PMT to continue to follow for ongoing needs and support.  Objective:  Physical Exam Constitutional:      General: She is not in acute distress.    Appearance: She is ill-appearing.  HENT:     Head: Normocephalic.     Mouth/Throat:     Mouth: Mucous membranes are dry.  Pulmonary:     Effort: Pulmonary effort is normal. No respiratory distress.  Abdominal:     Palpations: Abdomen is soft.     Tenderness: There is no abdominal tenderness.  Skin:    General: Skin is warm and dry.     Findings: Bruising present.  Neurological:     Mental Status: She is lethargic.     Comments: Moaning, unable to follow commands             Vital Signs: BP (!) 112/45 (BP Location: Right Arm)   Pulse 67   Temp (!) 97.5 F (36.4 C)   Resp 16   Ht 5' 9 (1.753 m)   Wt 75.5 kg   SpO2  98%   BMI 24.58 kg/m  SpO2: SpO2: 98 % O2 Device: O2 Device: Room Air O2 Flow Rate: O2 Flow Rate (L/min): 2 L/min   Palliative Care Assessment & Plan   Assessment/Recommendation/Plan  DNR/DNI/Comfort Low threshold to  initiate continuous hydromorphone  infusions based on frequency of PRN dosing administered Anticipate hospital passing versus IPU  Care plan was discussed with nursing staff, primary and cardiology team.  Thank you for allowing the Palliative Medicine Team to assist in the care of this patient.  Visit includes: Detailed review of medical records (labs, imaging, vital signs), medically appropriate exam (mental status, respiratory, cardiac, skin), discussed with treatment team, counseling and educating patient, family and staff, documenting clinical information, medication management and coordination of care.  Waddell Lesches, DNP, AGNP-C Palliative Medicine   Please contact Palliative Medicine Team phone at [phone] for questions and concerns.

## 2024-06-22 NOTE — Progress Notes (Signed)
 Patient arrived to the floor. Order to d/c pacemaker/ defibrillator. Magnet placed on pacemaker.   MD aware.

## 2024-06-22 NOTE — Death Summary Note (Signed)
 DEATH SUMMARY   Patient Details  Name: Leslie Parker MRN: [id number] DOB: 02/16/1933 ERE:Xjopdzuup, Lavenia, MD Admission/Discharge Information   Admit Date:  2024/07/01  Date of Death:  2024-07-05  Time of Death:  5:46pm  Length of Stay: 4   Principle Cause of death:    Bacteremia due to Enterococcus Terminal systolic congestive heart failure.  Hospital Diagnoses: Principal Problem:   Bacteremia due to Enterococcus Active Problems:   Community acquired pneumonia   Diabetes mellitus without complication (HCC)   Chronic a-fib (HCC)   Chronic diastolic CHF (congestive heart failure) (HCC)   Essential hypertension   COPD (chronic obstructive pulmonary disease) (HCC)   CKD (chronic kidney disease) stage 4, GFR 15-29 ml/min [facility])   Hypothyroidism   Pacemaker   Hospital Course: Ms. Leslie Parker is an 89 year old female with history of atrial fibrillation amiodarone  and apixaban , chronic combined systolic diastolic congestive heart failure with ejection fraction 25 to 30%`, pacemaker status, hypertension, GERD, hypothyroid, CKD stage IV, who presents for chief concerns of cough, congestion, shortness of breath. She did not meet sepsis criteria, chest x-ray showed bilateral lower fields consolidation with a small pleural effusion. Initially treated with Rocephin , vancomycin  and Zithromax .  Blood culture grew Enterococcus Faecium.  Patient was treated with vancomycin , but her condition does not seem to be improving.  Patient deteriorated, become more confused, due to end-stage congestive heart failure, worsening renal function, decision was made to transition to comfort care.   Assessment and Plan: Bacteremia due to Enterococcus Bilateral lower lobe pneumonia. Patient did not meet sepsis criteria at time of admission, sepsis ruled out. Blood culture grew Enterococcus, source is probably from pneumonia. I have personally reviewed patient chest x-ray, patient does have significant  bilateral pneumonia, more on the right side. TEE did not show any evidence of endocarditis. Discussed with ID, patient with continued on vancomycin . Patient finally transition to comfort care and patient condition not improving.   Upper abdominal pain. Likely due to gastritis/gastric ulcer.     Chronic combined systolic and diastolic congestive heart failure. Reviewed echocardiogram performed 01/2024, ejection fraction 25 to 30% with grade 2 diastolic dysfunction. Patient has been having significant elevation of BNP more than 4500 since January 2025, she has significant short of breath.  Patient could not tolerate any treatment, I discontinued torsemide  due to worsening renal function. Patient heart failure is at end-stage, which has been a major factor in patient death.   Chronic a-fib (HCC)    CKD (chronic kidney disease) stage 4, GFR 15-29 ml/min (HCC) Hypokalemia Metabolic acidosis. Hyponatremia. Renal function has worsened.   COPD (chronic obstructive pulmonary disease) (HCC) Not in acute exacerbation   Essential hypertension    Anemia of chronic disease.    Failure to thrive. Poor prognosis. Patient condition does not seem to be improving, patient is getting weaker, become more confused.  Given her advanced renal disease, terminal heart disease, patient prognosis is very poor.  There is little chance patient will improve, therefore, she was transition to comfort care.  She was started on IV Dilaudid  for pain.  She died in peace.       Procedures: TEE  Consultations: ID, Cardiology  The results of significant diagnostics from this hospitalization (including imaging, microbiology, ancillary and laboratory) are listed below for reference.   Significant Diagnostic Studies: ECHO TEE Result Date: 06/04/2024    TRANSESOPHOGEAL ECHO REPORT   Patient Name:   Leslie Parker Date of Exam: 06/04/2024 Medical Rec #:  [id number]  Height:       69.0 in Accession #:     [id number]    Weight:       166.9 lb Date of Birth:  1933-07-18      BSA:          1.913 m Patient Age:    89 years      BP:           84/46 mmHg Patient Gender: F             HR:           73 bpm. Exam Location:  [facility] Procedure: Transesophageal Echo, Cardiac Doppler, Color Doppler and 3D Echo            (Both Spectral and Color Flow Doppler were utilized during            procedure). Indications:     Bacteremia  History:         Patient has prior history of Echocardiogram examinations, most                  recent 06/03/2024. CHF, Pacemaker, COPD; Risk                  Factors:Hypertension. CKD.  Sonographer:     Philomena Daring Referring Phys:  [id number] CARALYN HUDSON Diagnosing Phys: Dearl Leaven PROCEDURE: After discussion of the risks and benefits of a TEE, an informed consent was obtained from a family member. The transesophogeal probe was passed without difficulty through the esophogus of the patient. Imaged were obtained with the patient in a left lateral decubitus position. Sedation performed by different physician. The patient was monitored while under deep sedation. Anesthestetic sedation was provided intravenously by Anesthesiology: 70mg  of Propofol . Image quality was good. The patient developed no complications during the procedure.  IMPRESSIONS 1. Sclerotic aortic valve. Moderately thickened leaflets with mildly reduced excursion. Mobile linear echodensity seen at site of leaflet coaptation. Location, apperance, and lack of associated valvular regurgitation favor Lambl's excrescence (normal finding), though valvular vegetation is possible. 2. No vegetations seen on device leads. No other significant valvular abnormalities. 3. Severely reduced LV systolic function. 4. Plaque noted in the descending aorta. FINDINGS  Left Ventricle: Left ventricular ejection fraction, by estimation, is 25 to 30%. The left ventricle has severely decreased function. The left ventricular internal cavity size was normal in size.  Right Ventricle: Lead present in RV. The right ventricular size is normal. No increase in right ventricular wall thickness. Right ventricular systolic function is mildly reduced. Left Atrium: Left atrial size was normal in size. No left atrial/left atrial appendage thrombus was detected. Right Atrium: Right atrial size was normal in size. Lead present in RA. Pericardium: Trivial pericardial effusion is present. Mitral Valve: Posterior leaflet is thickened and restricted. Calcification of chordal/subchordal apparatus. The mitral valve is abnormal. Trivial mitral valve regurgitation. Tricuspid Valve: The tricuspid valve is normal in structure. Tricuspid valve regurgitation is trivial. Aortic Valve: Linear mobile echodensities at site of valve coaptation are most consistent with Lambl's excrescences. Vegetation is also in the differential. The aortic valve is calcified. There is moderate thickening of the aortic valve. Aortic valve regurgitation is trivial. Pulmonic Valve: The pulmonic valve was normal in structure. Pulmonic valve regurgitation is trivial. Aorta: The aortic root is normal in size and structure. There is severe (Grade IV) plaque. Venous: The left upper pulmonary vein and left lower pulmonary vein are normal. IAS/Shunts: No atrial level shunt detected by color flow Doppler.  Additional Comments: Spectral Doppler performed. Akshay Pendyal Electronically signed by Dearl Leaven Signature Date/Time: 06/04/2024/4:39:04 PM    Final    ECHOCARDIOGRAM COMPLETE Result Date: 06/03/2024    ECHOCARDIOGRAM REPORT   Patient Name:   Leslie Parker Tech Date of Exam: 06/03/2024 Medical Rec #:  [id number]     Height:       69.0 in Accession #:    [id number]    Weight:       168.9 lb Date of Birth:  [birth year]      BSA:          1.923 m Patient Age:    89 years      BP:           136/58 mmHg Patient Gender: F             HR:           64 bpm. Exam Location:  [facility] Procedure: 2D Echo, Cardiac Doppler and Color Doppler (Both  Spectral and Color            Flow Doppler were utilized during procedure). Indications:     Bacteremia R78.81  History:         Patient has prior history of Echocardiogram examinations, most                  recent 01/21/2024. CHF; Risk Factors:Hypertension.  Sonographer:     Christopher Furnace Referring Phys:  [id number] AMY N COX Diagnosing Phys: [name] Lunger MD IMPRESSIONS  1. Left ventricular ejection fraction, by estimation, is 25 to 30%. Left ventricular ejection fraction by PLAX is 26 %. The left ventricle has severely decreased function. The left ventricle demonstrates global hypokinesis. The left ventricular internal  cavity size was moderately dilated. There is mild left ventricular hypertrophy. Left ventricular diastolic parameters are consistent with Grade I diastolic dysfunction (impaired relaxation).  2. Right ventricular systolic function is mildly reduced. The right ventricular size is normal.  3. Moderate pleural effusion in the left lateral region.  4. The mitral valve is normal in structure. Mild mitral valve regurgitation. No evidence of mitral stenosis.  5. The aortic valve is calcified. Aortic valve regurgitation is not visualized. Aortic valve sclerosis/calcification is present, without any evidence of aortic stenosis. Aortic valve mean gradient measures 5.0 mmHg.  6. The inferior vena cava is normal in size with greater than 50% respiratory variability, suggesting right atrial pressure of 3 mmHg. FINDINGS  Left Ventricle: Left ventricular ejection fraction, by estimation, is 25 to 30%. Left ventricular ejection fraction by PLAX is 26 %. The left ventricle has severely decreased function. The left ventricle demonstrates global hypokinesis. Strain was performed and the global longitudinal strain is indeterminate. The left ventricular internal cavity size was moderately dilated. There is mild left ventricular hypertrophy. Left ventricular diastolic parameters are consistent with Grade I diastolic  dysfunction (impaired relaxation). Right Ventricle: The right ventricular size is normal. No increase in right ventricular wall thickness. Right ventricular systolic function is mildly reduced. Left Atrium: Left atrial size was normal in size. Right Atrium: Right atrial size was normal in size. Pericardium: There is no evidence of pericardial effusion. Mitral Valve: The mitral valve is normal in structure. Mild mitral annular calcification. Mild mitral valve regurgitation. No evidence of mitral valve stenosis. MV peak gradient, 4.8 mmHg. The mean mitral valve gradient is 2.0 mmHg. Tricuspid Valve: The tricuspid valve is normal in structure. Tricuspid valve regurgitation is mild . No evidence of tricuspid stenosis. Aortic Valve:  The aortic valve is calcified. Aortic valve regurgitation is not visualized. Aortic valve sclerosis/calcification is present, without any evidence of aortic stenosis. Aortic valve mean gradient measures 5.0 mmHg. Aortic valve peak gradient measures 9.9 mmHg. Aortic valve area, by VTI measures 1.25 cm. Pulmonic Valve: The pulmonic valve was normal in structure. Pulmonic valve regurgitation is mild. No evidence of pulmonic stenosis. Aorta: The aortic root is normal in size and structure. Venous: The inferior vena cava is normal in size with greater than 50% respiratory variability, suggesting right atrial pressure of 3 mmHg. IAS/Shunts: No atrial level shunt detected by color flow Doppler. Additional Comments: 3D was performed not requiring image post processing on an independent workstation and was indeterminate. There is a moderate pleural effusion in the left lateral region.  LEFT VENTRICLE PLAX 2D LV EF:         Left            Diastology                ventricular     LV e' medial:    3.96 cm/s                ejection        LV E/e' medial:  14.9                fraction by     LV e' lateral:   4.54 cm/s                PLAX is 26      LV E/e' lateral: 13.0                %. LVIDd:          5.18 cm LVIDs:         4.55 cm LV PW:         1.47 cm LV IVS:        1.57 cm LVOT diam:     2.00 cm LV SV:         39 LV SV Index:   20 LVOT Area:     3.14 cm LV IVRT:       72 msec  LV Volumes (MOD) LV vol d, MOD    94.1 ml A2C: LV vol d, MOD    132.0 ml A4C: LV vol s, MOD    54.1 ml A2C: LV vol s, MOD    89.5 ml A4C: LV SV MOD A2C:   40.0 ml LV SV MOD A4C:   132.0 ml LV SV MOD BP:    43.1 ml RIGHT VENTRICLE RV Basal diam:  4.50 cm RV Mid diam:    4.08 cm LEFT ATRIUM             Index        RIGHT ATRIUM           Index LA diam:        4.40 cm 2.29 cm/m   RA Area:     14.20 cm LA Vol (A2C):   77.1 ml 40.10 ml/m  RA Volume:   36.00 ml  18.72 ml/m LA Vol (A4C):   59.8 ml 31.10 ml/m LA Biplane Vol: 71.1 ml 36.98 ml/m  AORTIC VALVE AV Area (Vmax):    1.24 cm AV Area (Vmean):   1.22 cm AV Area (VTI):     1.25 cm AV Vmax:           157.00 cm/s AV Vmean:  104.133 cm/s AV VTI:            0.310 m AV Peak Grad:      9.9 mmHg AV Mean Grad:      5.0 mmHg LVOT Vmax:         62.10 cm/s LVOT Vmean:        40.400 cm/s LVOT VTI:          0.123 m LVOT/AV VTI ratio: 0.40  AORTA Ao Root diam: 3.30 cm MITRAL VALVE                TRICUSPID VALVE MV Area (PHT): 5.46 cm     TR Peak grad:   21.0 mmHg MV Area VTI:   1.58 cm     TR Vmax:        229.00 cm/s MV Peak grad:  4.8 mmHg MV Mean grad:  2.0 mmHg     SHUNTS MV Vmax:       1.09 m/s     Systemic VTI:  0.12 m MV Vmean:      66.0 cm/s    Systemic Diam: 2.00 cm MV Decel Time: 139 msec MV E velocity: 59.20 cm/s MV A velocity: 111.00 cm/s MV E/A ratio:  0.53 [name] Lunger MD Electronically signed by [name] Lunger MD Signature Date/Time: 06/03/2024/5:17:44 PM    Final    DG Abd 1 View Result Date: 06/02/2024 EXAM: 1 VIEW XRAY OF THE ABDOMEN 06/02/2024 11:44:00 AM COMPARISON: 02/28/2024 CLINICAL HISTORY: Abdominal pain [id number]; [id number] Constipation [id number] FINDINGS: BOWEL: Moderately dilated loop of small bowel in the left upper quadrant, concerning for ileus or possibly  small bowel obstruction. Mild amount of stool is noted throughout the nondilated colon. SOFT TISSUES: No opaque urinary calculi. BONES: No acute osseous abnormality. IMPRESSION: 1. Moderately dilated loop of small bowel in the left upper quadrant, worrisome for ileus versus small-bowel obstruction. Electronically signed by: Lynwood Seip MD 06/02/2024 11:54 AM EST RP Workstation: [id number]   DG Chest Portable 1 View Result Date: 06/01/2024 CLINICAL DATA:  Cough and shortness of breath. EXAM: PORTABLE CHEST 1 VIEW COMPARISON:  04/13/2024 FINDINGS: The cardio pericardial silhouette is enlarged. Bibasilar collapse/consolidation with small to moderate bilateral pleural effusions, progressive in the interval. Left permanent pacemaker again noted. Telemetry leads overlie the chest. IMPRESSION: Bibasilar collapse/consolidation with small to moderate bilateral pleural effusions, progressive in the interval. Electronically Signed   By: Camellia Candle M.D.   On: 06/01/2024 06:24    Microbiology: Recent Results (from the past 240 hours)  Resp panel by RT-PCR (RSV, Flu A&B, Covid) Anterior Nasal Swab     Status: None   Collection Time: 06/01/24  6:14 AM   Specimen: Anterior Nasal Swab  Result Value Ref Range Status   SARS Coronavirus 2 by RT PCR NEGATIVE NEGATIVE Final    Comment: (NOTE) SARS-CoV-2 target nucleic acids are NOT DETECTED.  The SARS-CoV-2 RNA is generally detectable in upper respiratory specimens during the acute phase of infection. The lowest concentration of SARS-CoV-2 viral copies this assay can detect is 138 copies/mL. A negative result does not preclude SARS-Cov-2 infection and should not be used as the sole basis for treatment or other patient management decisions. A negative result may occur with  improper specimen collection/handling, submission of specimen other than nasopharyngeal swab, presence of viral mutation(s) within the areas targeted by this assay, and inadequate number of  viral copies(<138 copies/mL). A negative result must be combined with clinical observations, patient history, and epidemiological information. The expected result is  Negative.  Fact Sheet for Patients:  bloggercourse.com  Fact Sheet for Healthcare Providers:  seriousbroker.it  This test is no t yet approved or cleared by the [place]  FDA and  has been authorized for detection and/or diagnosis of SARS-CoV-2 by FDA under an Emergency Use Authorization (EUA). This EUA will remain  in effect (meaning this test can be used) for the duration of the COVID-19 declaration under Section 564(b)(1) of the Act, 21 U.S.C.section 360bbb-3(b)(1), unless the authorization is terminated  or revoked sooner.       Influenza A by PCR NEGATIVE NEGATIVE Final   Influenza B by PCR NEGATIVE NEGATIVE Final    Comment: (NOTE) The Xpert Xpress SARS-CoV-2/FLU/RSV plus assay is intended as an aid in the diagnosis of influenza from Nasopharyngeal swab specimens and should not be used as a sole basis for treatment. Nasal washings and aspirates are unacceptable for Xpert Xpress SARS-CoV-2/FLU/RSV testing.  Fact Sheet for Patients: bloggercourse.com  Fact Sheet for Healthcare Providers: seriousbroker.it  This test is not yet approved or cleared by the [place]  FDA and has been authorized for detection and/or diagnosis of SARS-CoV-2 by FDA under an Emergency Use Authorization (EUA). This EUA will remain in effect (meaning this test can be used) for the duration of the COVID-19 declaration under Section 564(b)(1) of the Act, 21 U.S.C. section 360bbb-3(b)(1), unless the authorization is terminated or revoked.     Resp Syncytial Virus by PCR NEGATIVE NEGATIVE Final    Comment: (NOTE) Fact Sheet for Patients: bloggercourse.com  Fact Sheet for Healthcare  Providers: seriousbroker.it  This test is not yet approved or cleared by the [place]  FDA and has been authorized for detection and/or diagnosis of SARS-CoV-2 by FDA under an Emergency Use Authorization (EUA). This EUA will remain in effect (meaning this test can be used) for the duration of the COVID-19 declaration under Section 564(b)(1) of the Act, 21 U.S.C. section 360bbb-3(b)(1), unless the authorization is terminated or revoked.  Performed at [facility], [address] [place], [place] [address]   Blood culture (single)     Status: Abnormal   Collection Time: 06/01/24  6:15 AM   Specimen: BLOOD  Result Value Ref Range Status   Specimen Description   Final    BLOOD BLOOD RIGHT ARM Performed at [facility], [address] [place], [place] [address]    Special Requests   Final    BOTTLES DRAWN AEROBIC AND ANAEROBIC Blood Culture adequate volume Performed at [facility], [address] [place], [place] [address]    Culture  Setup Time   Final    GRAM POSITIVE COCCI IN BOTH AEROBIC AND ANAEROBIC BOTTLES CRITICAL RESULT CALLED TO, READ BACK BY AND VERIFIED WITH:  NATHAN B. AT 2240 06/01/24 LL Performed at [facility], [address] [place], [place] [address]    Culture ENTEROCOCCUS FAECIUM (A)  Final   Report Status 07-02-2024 FINAL  Final   Organism ID, Bacteria ENTEROCOCCUS FAECIUM  Final      Susceptibility   Enterococcus faecium - MIC*    AMPICILLIN  >=32 RESISTANT Resistant     VANCOMYCIN  <=0.5 SENSITIVE Sensitive     GENTAMICIN  SYNERGY SENSITIVE Sensitive     * ENTEROCOCCUS FAECIUM  Blood Culture ID Panel (Reflexed)     Status: Abnormal   Collection Time: 06/01/24  6:15 AM  Result Value Ref Range Status   Enterococcus faecalis NOT DETECTED NOT DETECTED Final   Enterococcus Faecium DETECTED (A) NOT DETECTED Final  Comment: CRITICAL RESULT CALLED TO, READ BACK BY AND VERIFIED WITH: NATHAN  B. [id number] LRL    Listeria monocytogenes NOT DETECTED NOT DETECTED Final   Staphylococcus species NOT DETECTED NOT DETECTED Final   Staphylococcus aureus (BCID) NOT DETECTED NOT DETECTED Final   Staphylococcus epidermidis NOT DETECTED NOT DETECTED Final   Staphylococcus lugdunensis NOT DETECTED NOT DETECTED Final   Streptococcus species NOT DETECTED NOT DETECTED Final   Streptococcus agalactiae NOT DETECTED NOT DETECTED Final   Streptococcus pneumoniae NOT DETECTED NOT DETECTED Final   Streptococcus pyogenes NOT DETECTED NOT DETECTED Final   A.calcoaceticus-baumannii NOT DETECTED NOT DETECTED Final   Bacteroides fragilis NOT DETECTED NOT DETECTED Final   Enterobacterales NOT DETECTED NOT DETECTED Final   Enterobacter cloacae complex NOT DETECTED NOT DETECTED Final   Escherichia coli NOT DETECTED NOT DETECTED Final   Klebsiella aerogenes NOT DETECTED NOT DETECTED Final   Klebsiella oxytoca NOT DETECTED NOT DETECTED Final   Klebsiella pneumoniae NOT DETECTED NOT DETECTED Final   Proteus species NOT DETECTED NOT DETECTED Final   Salmonella species NOT DETECTED NOT DETECTED Final   Serratia marcescens NOT DETECTED NOT DETECTED Final   Haemophilus influenzae NOT DETECTED NOT DETECTED Final   Neisseria meningitidis NOT DETECTED NOT DETECTED Final   Pseudomonas aeruginosa NOT DETECTED NOT DETECTED Final   Stenotrophomonas maltophilia NOT DETECTED NOT DETECTED Final   Candida albicans NOT DETECTED NOT DETECTED Final   Candida auris NOT DETECTED NOT DETECTED Final   Candida glabrata NOT DETECTED NOT DETECTED Final   Candida krusei NOT DETECTED NOT DETECTED Final   Candida parapsilosis NOT DETECTED NOT DETECTED Final   Candida tropicalis NOT DETECTED NOT DETECTED Final   Cryptococcus neoformans/gattii NOT DETECTED NOT DETECTED Final   Vancomycin  resistance NOT DETECTED NOT DETECTED Final    Comment: Performed at [facility], [address] [place], [place] [address]  Blood  culture (single)     Status: Abnormal   Collection Time: 06/01/24  7:34 AM   Specimen: BLOOD  Result Value Ref Range Status   Specimen Description   Final    BLOOD RIGHT ANTECUBITAL Performed at [facility], [address] [place], [place] [address]    Special Requests   Final    BOTTLES DRAWN AEROBIC AND ANAEROBIC Blood Culture adequate volume Performed at [facility], [address] [place], [place] [address]    Culture  Setup Time   Final    GRAM POSITIVE COCCI IN BOTH AEROBIC AND ANAEROBIC BOTTLES CRITICAL VALUE NOTED.  VALUE IS CONSISTENT WITH PREVIOUSLY REPORTED AND CALLED VALUE. Performed at [facility], [address], [place], [place] [address]    Culture (A)  Final    ENTEROCOCCUS FAECIUM SUSCEPTIBILITIES PERFORMED ON PREVIOUS CULTURE WITHIN THE LAST 5 DAYS. Performed at [facility], [address] [place], [place] [address]    Report Status 06-29-2024 FINAL  Final  Urine Culture     Status: Abnormal   Collection Time: 06/02/24  8:00 AM   Specimen: Urine, Random  Result Value Ref Range Status   Specimen Description   Final    URINE, RANDOM Performed at [facility], [address], [place], [place] [address]    Special Requests   Final    NONE Reflexed from [id number] Performed at [facility], [address] [place], [place] [address]    Culture MULTIPLE SPECIES PRESENT, SUGGEST RECOLLECTION (A)  Final   Report Status 06/03/2024 FINAL  Final  Culture,  blood (Routine X 2) w Reflex to ID Panel     Status: None (Preliminary result)   Collection Time: 06/04/24  5:24 AM   Specimen: BLOOD  Result Value Ref Range Status   Specimen Description BLOOD BLOOD RIGHT ARM  Final   Special Requests   Final    BOTTLES DRAWN AEROBIC AND ANAEROBIC Blood Culture adequate volume   Culture   Final    NO GROWTH 1 DAY Performed at [facility], [address] [place], [place] [address]    Report Status PENDING   Incomplete  Culture, blood (Routine X 2) w Reflex to ID Panel     Status: None (Preliminary result)   Collection Time: 06/04/24  5:24 AM   Specimen: BLOOD  Result Value Ref Range Status   Specimen Description BLOOD BLOOD RIGHT ARM  Final   Special Requests   Final    BOTTLES DRAWN AEROBIC AND ANAEROBIC Blood Culture adequate volume   Culture   Final    NO GROWTH 1 DAY Performed at [facility], [address] [place], [place] [address]    Report Status PENDING  Incomplete  MRSA Next Gen by PCR, Nasal     Status: None   Collection Time: 06/04/24  6:37 AM   Specimen: Nasal Mucosa; Nasal Swab  Result Value Ref Range Status   MRSA by PCR Next Gen NOT DETECTED NOT DETECTED Final    Comment: (NOTE) The GeneXpert MRSA Assay (FDA approved for NASAL specimens only), is one component of a comprehensive MRSA colonization surveillance program. It is not intended to diagnose MRSA infection nor to guide or monitor treatment for MRSA infections. Test performance is not FDA approved in patients less than 45 years old. Performed at [facility], [address], [place], [place] [address]     Time spent: no charge minutes  Signed: Murvin Mana, MD Jun 18, 2024

## 2024-06-22 DEATH — deceased
